# Patient Record
Sex: Male | Born: 1955
Health system: Southern US, Community
[De-identification: ages and names within clinical notes are randomized; demographics above are authoritative.]

## PROBLEM LIST (undated history)

## (undated) DIAGNOSIS — M5117 Intervertebral disc disorders with radiculopathy, lumbosacral region: Secondary | ICD-10-CM

## (undated) DIAGNOSIS — I480 Paroxysmal atrial fibrillation: Secondary | ICD-10-CM

## (undated) DIAGNOSIS — Z87891 Personal history of nicotine dependence: Secondary | ICD-10-CM

## (undated) DIAGNOSIS — I5030 Unspecified diastolic (congestive) heart failure: Secondary | ICD-10-CM

## (undated) DIAGNOSIS — C61 Malignant neoplasm of prostate: Secondary | ICD-10-CM

## (undated) DIAGNOSIS — M1710 Unilateral primary osteoarthritis, unspecified knee: Secondary | ICD-10-CM

## (undated) DIAGNOSIS — K635 Polyp of colon: Secondary | ICD-10-CM

## (undated) DIAGNOSIS — G894 Chronic pain syndrome: Secondary | ICD-10-CM

## (undated) DIAGNOSIS — K5790 Diverticulosis of intestine, part unspecified, without perforation or abscess without bleeding: Secondary | ICD-10-CM

## (undated) DIAGNOSIS — I471 Supraventricular tachycardia, unspecified: Secondary | ICD-10-CM

## (undated) DIAGNOSIS — I10 Essential (primary) hypertension: Secondary | ICD-10-CM

## (undated) DIAGNOSIS — Z79899 Other long term (current) drug therapy: Secondary | ICD-10-CM

## (undated) DIAGNOSIS — Z8719 Personal history of other diseases of the digestive system: Secondary | ICD-10-CM

## (undated) DIAGNOSIS — I77810 Thoracic aortic ectasia: Secondary | ICD-10-CM

## (undated) DIAGNOSIS — T466X5A Adverse effect of antihyperlipidemic and antiarteriosclerotic drugs, initial encounter: Secondary | ICD-10-CM

## (undated) DIAGNOSIS — M791 Myalgia, unspecified site: Secondary | ICD-10-CM

## (undated) DIAGNOSIS — Z7901 Long term (current) use of anticoagulants: Secondary | ICD-10-CM

## (undated) DIAGNOSIS — R296 Repeated falls: Secondary | ICD-10-CM

## (undated) DIAGNOSIS — I700 Atherosclerosis of aorta: Secondary | ICD-10-CM

## (undated) DIAGNOSIS — E785 Hyperlipidemia, unspecified: Secondary | ICD-10-CM

## (undated) DIAGNOSIS — K648 Other hemorrhoids: Secondary | ICD-10-CM

## (undated) DIAGNOSIS — Z7982 Long term (current) use of aspirin: Secondary | ICD-10-CM

## (undated) DIAGNOSIS — F1190 Opioid use, unspecified, uncomplicated: Secondary | ICD-10-CM

## (undated) DIAGNOSIS — E782 Mixed hyperlipidemia: Secondary | ICD-10-CM

## (undated) DIAGNOSIS — R911 Solitary pulmonary nodule: Secondary | ICD-10-CM

## (undated) DIAGNOSIS — F419 Anxiety disorder, unspecified: Secondary | ICD-10-CM

## (undated) DIAGNOSIS — M1990 Unspecified osteoarthritis, unspecified site: Secondary | ICD-10-CM

## (undated) DIAGNOSIS — D494 Neoplasm of unspecified behavior of bladder: Secondary | ICD-10-CM

## (undated) DIAGNOSIS — G479 Sleep disorder, unspecified: Secondary | ICD-10-CM

## (undated) DIAGNOSIS — M899 Disorder of bone, unspecified: Secondary | ICD-10-CM

## (undated) DIAGNOSIS — K8020 Calculus of gallbladder without cholecystitis without obstruction: Secondary | ICD-10-CM

## (undated) DIAGNOSIS — D126 Benign neoplasm of colon, unspecified: Secondary | ICD-10-CM

## (undated) DIAGNOSIS — I429 Cardiomyopathy, unspecified: Secondary | ICD-10-CM

## (undated) DIAGNOSIS — M179 Osteoarthritis of knee, unspecified: Secondary | ICD-10-CM

## (undated) DIAGNOSIS — K4090 Unilateral inguinal hernia, without obstruction or gangrene, not specified as recurrent: Secondary | ICD-10-CM

## (undated) DIAGNOSIS — M75102 Unspecified rotator cuff tear or rupture of left shoulder, not specified as traumatic: Secondary | ICD-10-CM

## (undated) DIAGNOSIS — N200 Calculus of kidney: Secondary | ICD-10-CM

## (undated) DIAGNOSIS — R600 Localized edema: Secondary | ICD-10-CM

## (undated) DIAGNOSIS — Z7902 Long term (current) use of antithrombotics/antiplatelets: Secondary | ICD-10-CM

## (undated) DIAGNOSIS — D510 Vitamin B12 deficiency anemia due to intrinsic factor deficiency: Secondary | ICD-10-CM

## (undated) DIAGNOSIS — Z87442 Personal history of urinary calculi: Secondary | ICD-10-CM

## (undated) HISTORY — DX: Malignant neoplasm of prostate: C61

## (undated) HISTORY — DX: Unilateral primary osteoarthritis, unspecified knee: M17.10

## (undated) HISTORY — PX: KNEE SURGERY: SHX244

## (undated) HISTORY — DX: Supraventricular tachycardia: I47.1

## (undated) HISTORY — DX: Osteoarthritis of knee, unspecified: M17.9

## (undated) HISTORY — DX: Vitamin B12 deficiency anemia due to intrinsic factor deficiency: D51.0

## (undated) HISTORY — DX: Intervertebral disc disorders with radiculopathy, lumbosacral region: M51.17

## (undated) HISTORY — PX: OTHER SURGICAL HISTORY: SHX169

## (undated) HISTORY — DX: Calculus of kidney: N20.0

## (undated) HISTORY — DX: Personal history of urinary calculi: Z87.442

## (undated) HISTORY — DX: Supraventricular tachycardia, unspecified: I47.10

---

## 2000-01-17 HISTORY — PX: GASTRIC BYPASS: SHX52

## 2004-09-16 ENCOUNTER — Ambulatory Visit: Payer: Self-pay | Admitting: Internal Medicine

## 2008-04-07 ENCOUNTER — Ambulatory Visit: Payer: Self-pay | Admitting: Gastroenterology

## 2009-04-17 ENCOUNTER — Ambulatory Visit: Payer: Self-pay | Admitting: Internal Medicine

## 2010-01-16 DIAGNOSIS — N200 Calculus of kidney: Secondary | ICD-10-CM

## 2010-01-16 DIAGNOSIS — Z87442 Personal history of urinary calculi: Secondary | ICD-10-CM

## 2010-01-16 HISTORY — DX: Calculus of kidney: N20.0

## 2010-01-16 HISTORY — DX: Personal history of urinary calculi: Z87.442

## 2010-12-20 ENCOUNTER — Ambulatory Visit: Payer: Self-pay | Admitting: Urology

## 2010-12-22 ENCOUNTER — Ambulatory Visit: Payer: Self-pay | Admitting: Urology

## 2013-12-26 ENCOUNTER — Encounter: Payer: Self-pay | Admitting: Cardiovascular Disease

## 2013-12-26 ENCOUNTER — Other Ambulatory Visit (INDEPENDENT_AMBULATORY_CARE_PROVIDER_SITE_OTHER): Payer: BC Managed Care – PPO

## 2013-12-26 ENCOUNTER — Ambulatory Visit (INDEPENDENT_AMBULATORY_CARE_PROVIDER_SITE_OTHER): Payer: BC Managed Care – PPO | Admitting: Cardiovascular Disease

## 2013-12-26 ENCOUNTER — Other Ambulatory Visit: Payer: Self-pay

## 2013-12-26 VITALS — BP 104/80 | HR 92 | Ht 72.0 in | Wt 320.0 lb

## 2013-12-26 DIAGNOSIS — I4891 Unspecified atrial fibrillation: Secondary | ICD-10-CM

## 2013-12-26 DIAGNOSIS — I10 Essential (primary) hypertension: Secondary | ICD-10-CM | POA: Insufficient documentation

## 2013-12-26 DIAGNOSIS — C61 Malignant neoplasm of prostate: Secondary | ICD-10-CM

## 2013-12-26 DIAGNOSIS — Z7189 Other specified counseling: Secondary | ICD-10-CM | POA: Insufficient documentation

## 2013-12-26 DIAGNOSIS — Z8546 Personal history of malignant neoplasm of prostate: Secondary | ICD-10-CM | POA: Insufficient documentation

## 2013-12-26 DIAGNOSIS — Z79899 Other long term (current) drug therapy: Secondary | ICD-10-CM | POA: Insufficient documentation

## 2013-12-26 DIAGNOSIS — K625 Hemorrhage of anus and rectum: Secondary | ICD-10-CM | POA: Insufficient documentation

## 2013-12-26 MED ORDER — FUROSEMIDE 20 MG PO TABS: 20.0000 mg | ORAL_TABLET | Freq: Every day | ORAL | Status: DC | PRN

## 2013-12-26 NOTE — Assessment & Plan Note (Signed)
Blood pressure initially low on blood pressure check by the nurse. Mildly higher by my check but still 460 systolic Recommended if he has lightheaded spells concerning for orthostasis, we would encourage hydration, cut back on some of his medications such as amlodipine or the ACE inhibitor

## 2013-12-26 NOTE — Assessment & Plan Note (Signed)
Heart rate relatively well controlled. He continues in atrial fibrillation today. Tolerating xarelto 20 mg daily. We had a long discussion concerning his anticoagulation, rate control, options for his atrial fibrillation. We have recommended that he proceed with his colonoscopy and restart xarelto once appropriate. After he has been on anticoagulation for 3-4 weeks, we would start antiarrhythmic in an effort to restore normal sinus rhythm. Echocardiogram has been ordered in preparation for this. If unsuccessful,  DCCV would be offered

## 2013-12-26 NOTE — Assessment & Plan Note (Signed)
coagulation options discussed with him today. Stopping and starting anticoagulation for colonoscopy also discussed with him.

## 2013-12-26 NOTE — Assessment & Plan Note (Signed)
Recent prostate cancer treatment, rectal bleeding. Possible radiation prostatitis. Colonoscopy pending

## 2013-12-26 NOTE — Progress Notes (Signed)
Patient ID: [name], male    DOB: 22-Mar-1955, 58 y.o.   MRN: [id number]  HPI Comments: [name] is a pleasant 58 year old gentleman with history of prostate cancer, treatment with radiation and seed implants, rectal bleeding, found to be recently in atrial fibrillation who is referred for evaluation of arrhythmia. Notes indicate a history of gastric bypass, pernicious anemia, SVT, hypertension, ostial arthritis of the knee, disc disease  On presentation today, he reports that he feels relatively well. He does report having symptoms of feeling swimmy headed starting perhaps 3 months ago. Also occasional nausea episodes. Uncertain if this is related to new onset atrial fibrillation. In general he is asymptomatic from atrial fibrillation, denies having shortness of breath, no leg edema. He was started on Xarelto 20 mg daily. Denies having any GI bleeding on anticoagulation. colonoscopy  to be performed in the very near future for rectal bleeding  He reports having significant symptoms of rectal bleeding in July 2015. Blood in his urine and stool. This is better now. He denies any recent stressors that could have caused the atrial fibrillation. No prior echocardiogram  In general he reports that his weight has been trending downward. Reports having some dizziness, possibly low blood pressure. He reports the cause of his rectal bleeding was possibly from radiation proctitis  EKG on today's visit shows atrial fibrillation with ventricular rate 90 bpm, no significant ST or T-wave changes Previous EKG reviewed with him from primary care dated 12/08/2013 showing atrial fibrillation with rate 100 bpm   No Known Allergies  Outpatient Encounter Prescriptions as of 12/26/2013: amLODipine (NORVASC) 10 MG tablet, Take 10 mg by mouth daily. aspirin 325 MG EC tablet, Take 325 mg by mouth daily. benazepril (LOTENSIN) 40 MG tablet, Take 40 mg by mouth daily. cyanocobalamin (,VITAMIN B-12,) 1000  MCG/ML injection, Inject 1,000 mcg into the skin every 30 (thirty) days.  cyanocobalamin 1000 MCG tablet, Take 100 mcg by mouth daily. furosemide (LASIX) 20 MG tablet, Take 1 tablet (20 mg total) by mouth daily as needed. metoprolol succinate (TOPROL-XL) 50 MG 24 hr tablet, Take 50 mg by mouth daily. Take with or immediately following a meal. rivaroxaban (XARELTO) 20 MG TABS tablet, Take 20 mg by mouth daily with supper. tamsulosin (FLOMAX) 0.4 MG CAPS capsule, Take 0.4 mg by mouth 2 (two) times daily.  traZODone (DESYREL) 50 MG tablet, Take 50 mg by mouth at bedtime. (DISCONTINUED) meloxicam (MOBIC) 15 MG tablet, Take 15 mg by mouth daily.    Past Medical History:   HTN (hypertension)                                           SVT (supraventricular tachycardia)                           Pernicious anemia                                            Intervertebral disc disorder with radiculopath*              DJD (degenerative joint disease) of knee                     Kidney stone  2012         New onset a-fib                                              Prostate cancer                                             Past Surgical History:   KNEE SURGERY                                                    Comment:knee trauma   GASTRIC BYPASS                                   2002        Social History  reports that he quit smoking about 34 years ago. His smoking use included Cigarettes. He has a 10 pack-year smoking history. He does not have any smokeless tobacco history on file. He reports that he drinks about 7.2 oz of alcohol per week. He reports that he does not use illicit drugs.  Family History family history includes Arrhythmia in his brother and sister; Heart disease in his father; Hypertension in his father.         Review of Systems  Constitutional: Negative.   HENT: Negative.   Eyes: Negative.   Respiratory: Negative.   Cardiovascular:  Negative.   Gastrointestinal: Negative.   Musculoskeletal: Negative.   Neurological: Positive for dizziness.  Hematological: Negative.   Psychiatric/Behavioral: Negative.   All other systems reviewed and are negative.   BP 104/80 mmHg  Pulse 92  Ht 6' (1.829 m)  Wt 320 lb (145.151 kg)  BMI 43.39 kg/m2  Physical Exam  Constitutional: He is oriented to person, place, and time. He appears well-developed and well-nourished.  HENT:  Head: Normocephalic.  Nose: Nose normal.  Mouth/Throat: Oropharynx is clear and moist.  Eyes: Conjunctivae are normal. Pupils are equal, round, and reactive to light.  Neck: Normal range of motion. Neck supple. No JVD present.  Cardiovascular: Normal rate, regular rhythm, S1 normal, S2 normal, normal heart sounds and intact distal pulses.  Exam reveals no gallop and no friction rub.   No murmur heard. Pulmonary/Chest: Effort normal and breath sounds normal. No respiratory distress. He has no wheezes. He has no rales. He exhibits no tenderness.  Abdominal: Soft. Bowel sounds are normal. He exhibits no distension. There is no tenderness.  Musculoskeletal: Normal range of motion. He exhibits no edema or tenderness.  Lymphadenopathy:    He has no cervical adenopathy.  Neurological: He is alert and oriented to person, place, and time. Coordination normal.  Skin: Skin is warm and dry. No rash noted. No erythema.  Psychiatric: He has a normal mood and affect. His behavior is normal. Judgment and thought content normal.      Assessment and Plan   Nursing note and vitals reviewed.

## 2013-12-26 NOTE — Assessment & Plan Note (Signed)
Unclear. His a hemorrhoid or other etiology. Acceptable risk for colonoscopy. We would stop the xarelto for 3 days prior to the procedure, restart one to 2 days after the procedure depending on if biopsies are taken

## 2013-12-26 NOTE — Patient Instructions (Signed)
You are doing well. No medication changes were made.  Please take lasix as needed   We will schedule an echocardiogram for atrial fibrillation  Please call us if you have new issues that need to be addressed before your next appt.  Your physician wants you to follow-up in:   7 weeks.

## 2014-01-13 ENCOUNTER — Ambulatory Visit: Payer: Self-pay | Admitting: Gastroenterology

## 2014-02-13 ENCOUNTER — Encounter: Payer: Self-pay | Admitting: Cardiovascular Disease

## 2014-02-13 ENCOUNTER — Encounter (INDEPENDENT_AMBULATORY_CARE_PROVIDER_SITE_OTHER): Payer: Self-pay

## 2014-02-13 ENCOUNTER — Ambulatory Visit (INDEPENDENT_AMBULATORY_CARE_PROVIDER_SITE_OTHER): Payer: BLUE CROSS/BLUE SHIELD | Admitting: Cardiovascular Disease

## 2014-02-13 ENCOUNTER — Ambulatory Visit: Payer: BC Managed Care – PPO | Admitting: Cardiovascular Disease

## 2014-02-13 VITALS — BP 110/80 | HR 99 | Ht 72.0 in | Wt 320.2 lb

## 2014-02-13 DIAGNOSIS — I4891 Unspecified atrial fibrillation: Secondary | ICD-10-CM

## 2014-02-13 DIAGNOSIS — Z7189 Other specified counseling: Secondary | ICD-10-CM

## 2014-02-13 DIAGNOSIS — K625 Hemorrhage of anus and rectum: Secondary | ICD-10-CM

## 2014-02-13 DIAGNOSIS — I10 Essential (primary) hypertension: Secondary | ICD-10-CM

## 2014-02-13 MED ORDER — AMIODARONE HCL 200 MG PO TABS: 200.0000 mg | ORAL_TABLET | Freq: Two times a day (BID) | ORAL | Status: DC

## 2014-02-13 NOTE — Assessment & Plan Note (Signed)
Blood pressure has been running low on his prostate medication. We have recommended he cut his amlodipine down to 5 mg daily

## 2014-02-13 NOTE — Assessment & Plan Note (Signed)
Tolerating Xarelto. No recurrent bleeding

## 2014-02-13 NOTE — Assessment & Plan Note (Signed)
We will start him on amiodarone 400 mg twice a day for 5 days down to 200 mg twice a day with EKG in 2 weeks time. If he has not converted to normal sinus rhythm, we would arrange a cardioversion. Recommended he stay on anticoagulation

## 2014-02-13 NOTE — Progress Notes (Signed)
Patient ID: [name], male    DOB: 25-Mar-1955, 59 y.o.   MRN: [id number]  HPI Comments: Mr. Rotondo is a pleasant 59 year old gentleman with history of prostate cancer, treatment with radiation and seed implants, rectal bleeding, who follows up for recent diagnosis of atrial fibrillation at the end of 2015 Notes indicate a history of gastric bypass, pernicious anemia, SVT, hypertension, ostial arthritis of the knee, disc disease  In follow-up today, he reports feeling well. Occasionally feels fluttering, palpitations. One episode of lightheadedness when he stood up quickly in the evening going to the bathroom. He fell down, hit his head. Reports taking Flomax twice a day. Blood pressure has been running low recently, around 297 systolic denies any lower extremity edema, no shortness of breath with exertion  Recent echocardiogram December 2015 showing mildly depressed ejection fraction 45%, moderately dilated left atrium  No GI bleeding on anticoagulation. Recent colonoscopy  EKG on today's visit shows atrial fibrillation with rate 99 bpm, no significant ST or T-wave changes  Other past medical history  He reports having significant symptoms of rectal bleeding in July 2015. Blood in his urine and stool. This is better now. He denies any recent stressors that could have caused the atrial fibrillation.  No Known Allergies  Outpatient Encounter Prescriptions as of 02/13/2014  Medication Sig  . amLODipine (NORVASC) 10 MG tablet Take 10 mg by mouth daily.  Marland Kitchen aspirin 325 MG EC tablet Take 325 mg by mouth daily.  . benazepril (LOTENSIN) 40 MG tablet Take 40 mg by mouth daily.  . cyanocobalamin (,VITAMIN B-12,) 1000 MCG/ML injection Inject 1,000 mcg into the skin every 30 (thirty) days.   . cyanocobalamin 1000 MCG tablet Take 100 mcg by mouth daily.  . furosemide (LASIX) 20 MG tablet Take 1 tablet (20 mg total) by mouth daily as needed.  . metoprolol succinate (TOPROL-XL) 50 MG 24 hr tablet  Take 50 mg by mouth daily. Take with or immediately following a meal.  . rivaroxaban (XARELTO) 20 MG TABS tablet Take 20 mg by mouth daily with supper.  . tamsulosin (FLOMAX) 0.4 MG CAPS capsule Take 0.4 mg by mouth 2 (two) times daily.   . traZODone (DESYREL) 50 MG tablet Take 50 mg by mouth at bedtime.  Marland Kitchen amiodarone (PACERONE) 200 MG tablet Take 1 tablet (200 mg total) by mouth 2 (two) times daily.    Past Medical History  Diagnosis Date  . HTN (hypertension)   . SVT (supraventricular tachycardia)   . Pernicious anemia   . Intervertebral disc disorder with radiculopathy of lumbosacral region   . DJD (degenerative joint disease) of knee   . Kidney stone 2012  . New onset a-fib   . Prostate cancer     Past Surgical History  Procedure Laterality Date  . Knee surgery      knee trauma  . Gastric bypass  2002    Social History  reports that he quit smoking about 35 years ago. His smoking use included Cigarettes. He has a 10 pack-year smoking history. He does not have any smokeless tobacco history on file. He reports that he drinks about 7.2 oz of alcohol per week. He reports that he does not use illicit drugs.  Family History family history includes Arrhythmia in his brother and sister; Heart disease in his father; Hypertension in his father.  Review of Systems  Constitutional: Negative.   Respiratory: Negative.   Cardiovascular: Positive for palpitations.  Gastrointestinal: Negative.   Musculoskeletal: Negative.  Neurological: Positive for dizziness.  Hematological: Negative.   Psychiatric/Behavioral: Negative.   All other systems reviewed and are negative.   BP 110/80 mmHg  Pulse 99  Ht 6' (1.829 m)  Wt 320 lb 4 oz (145.264 kg)  BMI 43.42 kg/m2  Physical Exam  Constitutional: He is oriented to person, place, and time. He appears well-developed and well-nourished.  HENT:  Head: Normocephalic.  Nose: Nose normal.  Mouth/Throat: Oropharynx is clear and moist.   Eyes: Conjunctivae are normal. Pupils are equal, round, and reactive to light.  Neck: Normal range of motion. Neck supple. No JVD present.  Cardiovascular: Normal rate, S1 normal, S2 normal, normal heart sounds and intact distal pulses.  An irregularly irregular rhythm present. Exam reveals no gallop and no friction rub.   No murmur heard. Pulmonary/Chest: Effort normal and breath sounds normal. No respiratory distress. He has no wheezes. He has no rales. He exhibits no tenderness.  Abdominal: Soft. Bowel sounds are normal. He exhibits no distension. There is no tenderness.  Musculoskeletal: Normal range of motion. He exhibits no edema or tenderness.  Lymphadenopathy:    He has no cervical adenopathy.  Neurological: He is alert and oriented to person, place, and time. Coordination normal.  Skin: Skin is warm and dry. No rash noted. No erythema.  Psychiatric: He has a normal mood and affect. His behavior is normal. Judgment and thought content normal.      Assessment and Plan   Nursing note and vitals reviewed.

## 2014-02-13 NOTE — Patient Instructions (Addendum)
You are doing well.  Cut the amlodipine in 1/2 daily, for low blood pressure  Please start amiodarone 400 mg twice a day After 5 days, Please decrease the dose down to 200 mg twice a day  Please come in for an ekg in 2 weeks  Please call us if you have new issues that need to be addressed before your next appt.  Your physician wants you to follow-up in: 5 to 6 weeks

## 2014-02-13 NOTE — Assessment & Plan Note (Signed)
No recurrence of his rectal bleeding on anticoagulation

## 2014-02-15 ENCOUNTER — Other Ambulatory Visit: Payer: Self-pay | Admitting: Cardiovascular Disease

## 2014-02-27 ENCOUNTER — Ambulatory Visit (INDEPENDENT_AMBULATORY_CARE_PROVIDER_SITE_OTHER): Payer: BLUE CROSS/BLUE SHIELD

## 2014-02-27 VITALS — BP 114/76 | HR 83

## 2014-02-27 DIAGNOSIS — I4891 Unspecified atrial fibrillation: Secondary | ICD-10-CM

## 2014-02-27 NOTE — Progress Notes (Signed)
1.) Reason for visit: EKG  2.) Name of MD requesting visit: Dr. Rockey Situ  3.) H&P:  Pt advised at ov 02/13/14 to come in for EKG.  Per Dr. Rockey Situ, if pt still in afib, we will set up an cardioversion.   4.) ROS related to problem: Pt reports that he has been somewhat SOB, BP is still running a bit low for him.  Reports lightheadedness on standing, though does report a decrease in these episodes.  5.) Assessment and plan per MD: Pt sched for DCCV 03/13/14 @ 7:30.  Reviewed instructions w/ pt and he verbalizes understanding.

## 2014-02-27 NOTE — Patient Instructions (Signed)
Your physician has recommended that you have a Cardioversion (DCCV). Electrical Cardioversion uses a jolt of electricity to your heart either through paddles or wired patches attached to your chest. This is a controlled, usually prescheduled, procedure. Defibrillation is done under light anesthesia in the hospital, and you usually go home the day of the procedure. This is done to get your heart back into a normal rhythm. You are not awake for the procedure. Please see the instruction sheet given to you today.  Please arrive at the [place] entrance of [facility] at 6:30 am on Friday, February 26  -Do not eat or drink after midnight the night before your procedure -Take all or your medications that morning with a sip of water -You will need someone to drive you home after your procedure

## 2014-02-28 LAB — CBC WITH DIFFERENTIAL/PLATELET
BASOS ABS: 0 10*3/uL (ref 0.0–0.2)
BASOS: 0 %
EOS ABS: 0.1 10*3/uL (ref 0.0–0.4)
Eos: 1 %
HCT: 40.9 % (ref 37.5–51.0)
HEMOGLOBIN: 13 g/dL (ref 12.6–17.7)
Immature Grans (Abs): 0 10*3/uL (ref 0.0–0.1)
Immature Granulocytes: 0 %
Lymphocytes Absolute: 2 10*3/uL (ref 0.7–3.1)
Lymphs: 24 %
MCH: 24.3 pg — ABNORMAL LOW (ref 26.6–33.0)
MCHC: 31.8 g/dL (ref 31.5–35.7)
MCV: 76 fL — ABNORMAL LOW (ref 79–97)
Monocytes Absolute: 0.9 10*3/uL (ref 0.1–0.9)
Monocytes: 11 %
NEUTROS PCT: 64 %
Neutrophils Absolute: 5.1 10*3/uL (ref 1.4–7.0)
Platelets: 216 10*3/uL (ref 150–379)
RBC: 5.36 x10E6/uL (ref 4.14–5.80)
RDW: 17.2 % — ABNORMAL HIGH (ref 12.3–15.4)
WBC: 8.1 10*3/uL (ref 3.4–10.8)

## 2014-02-28 LAB — BASIC METABOLIC PANEL
BUN/Creatinine Ratio: 14 (ref 10–22)
BUN: 16 mg/dL (ref 8–27)
CALCIUM: 9.2 mg/dL (ref 8.6–10.2)
CHLORIDE: 109 mmol/L — AB (ref 97–108)
CO2: 21 mmol/L (ref 18–29)
Creatinine, Ser: 1.14 mg/dL (ref 0.76–1.27)
GFR calc non Af Amer: 70 mL/min/{1.73_m2} (ref >59)
GFR, EST AFRICAN AMERICAN: 80 mL/min/{1.73_m2} (ref >59)
GLUCOSE: 90 mg/dL (ref 65–99)
Potassium: 5.6 mmol/L — ABNORMAL HIGH (ref 3.5–5.2)
Sodium: 147 mmol/L — ABNORMAL HIGH (ref 134–144)

## 2014-02-28 LAB — PROTIME-INR
INR: 1.2 (ref 0.8–1.2)
Prothrombin Time: 12.7 s — ABNORMAL HIGH (ref 9.1–12.0)

## 2014-03-08 ENCOUNTER — Other Ambulatory Visit: Payer: Self-pay | Admitting: Cardiovascular Disease

## 2014-03-13 ENCOUNTER — Ambulatory Visit: Payer: Self-pay | Admitting: Cardiovascular Disease

## 2014-03-13 DIAGNOSIS — I4891 Unspecified atrial fibrillation: Secondary | ICD-10-CM

## 2014-03-13 HISTORY — PX: CARDIOVERSION: SHX1299

## 2014-03-20 ENCOUNTER — Encounter: Payer: Self-pay | Admitting: Cardiovascular Disease

## 2014-03-20 ENCOUNTER — Ambulatory Visit (INDEPENDENT_AMBULATORY_CARE_PROVIDER_SITE_OTHER): Payer: BLUE CROSS/BLUE SHIELD | Admitting: Cardiovascular Disease

## 2014-03-20 VITALS — BP 142/82 | HR 59 | Ht 72.0 in | Wt 320.0 lb

## 2014-03-20 DIAGNOSIS — E669 Obesity, unspecified: Secondary | ICD-10-CM

## 2014-03-20 DIAGNOSIS — E875 Hyperkalemia: Secondary | ICD-10-CM

## 2014-03-20 DIAGNOSIS — K625 Hemorrhage of anus and rectum: Secondary | ICD-10-CM

## 2014-03-20 DIAGNOSIS — I10 Essential (primary) hypertension: Secondary | ICD-10-CM

## 2014-03-20 DIAGNOSIS — Z7189 Other specified counseling: Secondary | ICD-10-CM

## 2014-03-20 DIAGNOSIS — Z9884 Bariatric surgery status: Secondary | ICD-10-CM | POA: Insufficient documentation

## 2014-03-20 DIAGNOSIS — I4891 Unspecified atrial fibrillation: Secondary | ICD-10-CM

## 2014-03-20 MED ORDER — METOPROLOL SUCCINATE ER 25 MG PO TB24: 25.0000 mg | ORAL_TABLET | Freq: Every day | ORAL | Status: DC

## 2014-03-20 NOTE — Assessment & Plan Note (Signed)
We will recheck a potassium today, previously was 5.6

## 2014-03-20 NOTE — Assessment & Plan Note (Signed)
No symptoms of bleeding. We'll continue anticoagulation

## 2014-03-20 NOTE — Assessment & Plan Note (Signed)
He denies any further rectal bleeding on anticoagulation

## 2014-03-20 NOTE — Assessment & Plan Note (Signed)
We have encouraged continued exercise, careful diet management in an effort to lose weight. 

## 2014-03-20 NOTE — Patient Instructions (Signed)
You are doing well.  We will recheck BMP today  Please decrease the amiodarone down to one a day Please add a metoprolol 25 mg at night, continue 50 mg in the AM  Please call us if you have new issues that need to be addressed before your next appt.  Your physician wants you to follow-up in: 6 months.  You will receive a reminder letter in the mail two months in advance. If you don't receive a letter, please call our office to schedule the follow-up appointment.

## 2014-03-20 NOTE — Assessment & Plan Note (Signed)
He does report occasional loose bowel movements

## 2014-03-20 NOTE — Assessment & Plan Note (Signed)
Recent cardioversion last week February 2016. This was successful, and in follow-up today he is maintaining normal sinus rhythm. We have recommended he stay on anticoagulation, he decrease the amiodarone down to 200 mg daily, add additional metoprolol succinate 25 mg at nighttime (he reports heart rate is mildly elevated in the early morning). Suggested he monitor his blood pressure and heart rate

## 2014-03-20 NOTE — Progress Notes (Signed)
Patient ID: RUTILIO YELLOWHAIR, male    DOB: 10/30/1955, 59 y.o.   MRN: [id number]  HPI Comments: Mr. Burrows is a pleasant 59 year old gentleman with history of prostate cancer, treatment with radiation and seed implants, rectal bleeding, who follows up for atrial fibrillation , status post cardioversion last week February 2016 Atrial fibrillation started at the end of 2015 Notes indicate a history of gastric bypass, pernicious anemia, SVT, hypertension, ostial arthritis of the knee, disc disease  In follow-up today, he reports that he feels well. He can notice a difference in normal sinus rhythm. Better energy, sleeping better, less shortness of breath. Overall feels great. He has been tracking his heart rate and reports heart rate typically 60 up to 70 bpm. Prior to cardioversion, heart rate was never in this range. That is how he knows he has been maintaining normal rhythm. Reports blood pressure has been well-controlled. Amlodipine decreased down to 5 mg previously No GI bleeding on anticoagulation. He denies any significant lower extremity swelling. He is not taking Lasix very much  EKG on today's visit shows normal sinus rhythm with rate 59 bpm, no significant ST or T-wave changes  Other past medical history Previous episode of lightheadedness while he was in atrial fibrillation when he stood up quickly in the evening going to the bathroom. He fell down, hit his head. Reports taking Flomax twice a day. Blood pressure has been running low recently, around 008 systolic denies any lower extremity edema, no shortness of breath with exertion   echocardiogram December 2015 showing mildly depressed ejection fraction 45%, moderately dilated left atrium  He reports having significant symptoms of rectal bleeding in July 2015. Blood in his urine and stool. This is better now.  No Known Allergies  Outpatient Encounter Prescriptions as of 03/20/2014  Medication Sig  . amiodarone (PACERONE) 200 MG tablet  Take 1 tablet (200 mg total) by mouth 2 (two) times daily.  Marland Kitchen amLODipine (NORVASC) 5 MG tablet TAKE 1 TABLET BY MOUTH EVERY DAY  . aspirin 325 MG EC tablet Take 325 mg by mouth daily.  . benazepril (LOTENSIN) 40 MG tablet Take 40 mg by mouth daily.  . cyanocobalamin (,VITAMIN B-12,) 1000 MCG/ML injection Inject 1,000 mcg into the skin every 30 (thirty) days.   . furosemide (LASIX) 20 MG tablet Take 1 tablet (20 mg total) by mouth daily as needed.  . metoprolol succinate (TOPROL-XL) 50 MG 24 hr tablet Take 50 mg by mouth daily. Take with or immediately following a meal.  . tamsulosin (FLOMAX) 0.4 MG CAPS capsule Take 0.4 mg by mouth 2 (two) times daily.   . traZODone (DESYREL) 50 MG tablet Take 50 mg by mouth at bedtime.  Alveda Reasons 20 MG TABS tablet TAKE 1 TABLET BY MOUTH EVERY DAY  . metoprolol succinate (TOPROL XL) 25 MG 24 hr tablet Take 1 tablet (25 mg total) by mouth daily.  . [DISCONTINUED] amLODipine (NORVASC) 10 MG tablet Take 10 mg by mouth daily.  . [DISCONTINUED] cyanocobalamin 1000 MCG tablet Take 100 mcg by mouth daily.    Past Medical History  Diagnosis Date  . HTN (hypertension)   . SVT (supraventricular tachycardia)   . Pernicious anemia   . Intervertebral disc disorder with radiculopathy of lumbosacral region   . DJD (degenerative joint disease) of knee   . Kidney stone 2012  . New onset a-fib   . Prostate cancer     Past Surgical History  Procedure Laterality Date  . Knee surgery  knee trauma  . Gastric bypass  2002    Social History  reports that he quit smoking about 35 years ago. His smoking use included Cigarettes. He has a 10 pack-year smoking history. He does not have any smokeless tobacco history on file. He reports that he drinks about 7.2 oz of alcohol per week. He reports that he does not use illicit drugs.  Family History family history includes Arrhythmia in his brother and sister; Heart disease in his father; Hypertension in his  father.  Review of Systems  Constitutional: Negative.   Respiratory: Negative.   Cardiovascular: Negative.   Gastrointestinal: Negative.   Musculoskeletal: Negative.   Neurological: Negative.   Hematological: Negative.   Psychiatric/Behavioral: Negative.   All other systems reviewed and are negative.   BP 142/82 mmHg  Pulse 59  Ht 6' (1.829 m)  Wt 320 lb (145.151 kg)  BMI 43.39 kg/m2  Physical Exam  Constitutional: He is oriented to person, place, and time. He appears well-developed and well-nourished.  HENT:  Head: Normocephalic.  Nose: Nose normal.  Mouth/Throat: Oropharynx is clear and moist.  Eyes: Conjunctivae are normal. Pupils are equal, round, and reactive to light.  Neck: Normal range of motion. Neck supple. No JVD present.  Cardiovascular: Normal rate, S1 normal, S2 normal, normal heart sounds and intact distal pulses.  An irregularly irregular rhythm present. Exam reveals no gallop and no friction rub.   No murmur heard. Pulmonary/Chest: Effort normal and breath sounds normal. No respiratory distress. He has no wheezes. He has no rales. He exhibits no tenderness.  Abdominal: Soft. Bowel sounds are normal. He exhibits no distension. There is no tenderness.  Musculoskeletal: Normal range of motion. He exhibits no edema or tenderness.  Lymphadenopathy:    He has no cervical adenopathy.  Neurological: He is alert and oriented to person, place, and time. Coordination normal.  Skin: Skin is warm and dry. No rash noted. No erythema.  Psychiatric: He has a normal mood and affect. His behavior is normal. Judgment and thought content normal.      Assessment and Plan   Nursing note and vitals reviewed.

## 2014-03-20 NOTE — Assessment & Plan Note (Signed)
Blood pressure is well controlled on today's visit. No changes made to the medications. 

## 2014-03-21 LAB — BASIC METABOLIC PANEL
BUN/Creatinine Ratio: 15 (ref 10–22)
BUN: 15 mg/dL (ref 8–27)
CO2: 22 mmol/L (ref 18–29)
CREATININE: 0.97 mg/dL (ref 0.76–1.27)
Calcium: 9.2 mg/dL (ref 8.6–10.2)
Chloride: 104 mmol/L (ref 97–108)
GFR calc Af Amer: 98 mL/min/{1.73_m2} (ref >59)
GFR calc non Af Amer: 84 mL/min/{1.73_m2} (ref >59)
Glucose: 92 mg/dL (ref 65–99)
Potassium: 5.3 mmol/L — ABNORMAL HIGH (ref 3.5–5.2)
Sodium: 142 mmol/L (ref 134–144)

## 2014-04-10 ENCOUNTER — Other Ambulatory Visit: Payer: Self-pay | Admitting: Cardiovascular Disease

## 2014-05-11 LAB — SURGICAL PATHOLOGY

## 2014-06-28 ENCOUNTER — Other Ambulatory Visit: Payer: Self-pay | Admitting: Family Medicine

## 2014-06-30 ENCOUNTER — Other Ambulatory Visit: Payer: Self-pay | Admitting: Cardiovascular Disease

## 2014-07-01 ENCOUNTER — Telehealth: Payer: Self-pay

## 2014-07-01 NOTE — Telephone Encounter (Signed)
Rx for Tramadol phoned in

## 2014-07-02 ENCOUNTER — Telehealth: Payer: Self-pay | Admitting: Family Medicine

## 2014-07-02 NOTE — Telephone Encounter (Signed)
Pt requesting new script for Tamadol.  He will run out tomorrow.   [facility] [place], [place]

## 2014-08-03 ENCOUNTER — Telehealth: Payer: Self-pay | Admitting: Family Medicine

## 2014-08-03 MED ORDER — TRAMADOL HCL 50 MG PO TABS: 50.0000 mg | ORAL_TABLET | Freq: Two times a day (BID) | ORAL | Status: DC | PRN

## 2014-08-03 NOTE — Telephone Encounter (Signed)
Pt called stated pharmacy has faxed [place] several times for a refill on Tramadol. Pt wants to be able to pick this up today. Pharm is [facility] in [place]. Thanks.

## 2014-08-03 NOTE — Telephone Encounter (Signed)
E-fax came through for refill on: Rx: traMADol (ULTRAM) 50 MG tablet Copy in basket

## 2014-08-04 NOTE — Telephone Encounter (Signed)
Rx printed to wrong printer yesterday, called in today [facility]

## 2014-08-05 ENCOUNTER — Telehealth: Payer: Self-pay

## 2014-08-05 MED ORDER — TRAMADOL HCL 50 MG PO TABS: 50.0000 mg | ORAL_TABLET | Freq: Four times a day (QID) | ORAL | Status: DC | PRN

## 2014-08-05 NOTE — Telephone Encounter (Signed)
Patient states his Tramadol has historically been 1-2tab q6h prn, his new rx is for 1tab q12h He cannot take anything else for arthritis pain because of blood thinner. He would like you to write the Rx again - [place]

## 2014-08-06 NOTE — Telephone Encounter (Signed)
Called and left on prescription voicemail.

## 2014-08-15 ENCOUNTER — Other Ambulatory Visit: Payer: Self-pay | Admitting: Family Medicine

## 2014-08-17 ENCOUNTER — Other Ambulatory Visit: Payer: Self-pay | Admitting: Family Medicine

## 2014-08-24 ENCOUNTER — Other Ambulatory Visit: Payer: Self-pay | Admitting: Unknown Physician Specialty

## 2014-08-24 NOTE — Telephone Encounter (Signed)
Your patient.  Thanks 

## 2014-10-11 ENCOUNTER — Other Ambulatory Visit: Payer: Self-pay | Admitting: Family Medicine

## 2014-10-27 ENCOUNTER — Encounter: Payer: Self-pay | Admitting: Family Medicine

## 2014-10-27 ENCOUNTER — Ambulatory Visit (INDEPENDENT_AMBULATORY_CARE_PROVIDER_SITE_OTHER): Payer: BLUE CROSS/BLUE SHIELD | Admitting: Family Medicine

## 2014-10-27 ENCOUNTER — Inpatient Hospital Stay: Admission: RE | Admit: 2014-10-27 | Payer: BLUE CROSS/BLUE SHIELD | Source: Ambulatory Visit

## 2014-10-27 ENCOUNTER — Encounter: Payer: Self-pay | Admitting: Cardiovascular Disease

## 2014-10-27 ENCOUNTER — Ambulatory Visit (INDEPENDENT_AMBULATORY_CARE_PROVIDER_SITE_OTHER): Payer: BLUE CROSS/BLUE SHIELD | Admitting: Cardiovascular Disease

## 2014-10-27 VITALS — BP 112/72 | HR 63 | Resp 16 | Ht 72.0 in | Wt 316.2 lb

## 2014-10-27 VITALS — BP 136/87 | HR 67 | Temp 98.6°F | Ht 70.7 in | Wt 314.0 lb

## 2014-10-27 DIAGNOSIS — K625 Hemorrhage of anus and rectum: Secondary | ICD-10-CM

## 2014-10-27 DIAGNOSIS — G5603 Carpal tunnel syndrome, bilateral upper limbs: Secondary | ICD-10-CM | POA: Diagnosis not present

## 2014-10-27 DIAGNOSIS — Z8249 Family history of ischemic heart disease and other diseases of the circulatory system: Secondary | ICD-10-CM

## 2014-10-27 DIAGNOSIS — I10 Essential (primary) hypertension: Secondary | ICD-10-CM | POA: Diagnosis not present

## 2014-10-27 DIAGNOSIS — M1731 Unilateral post-traumatic osteoarthritis, right knee: Secondary | ICD-10-CM | POA: Diagnosis not present

## 2014-10-27 DIAGNOSIS — I4891 Unspecified atrial fibrillation: Secondary | ICD-10-CM | POA: Diagnosis not present

## 2014-10-27 DIAGNOSIS — M179 Osteoarthritis of knee, unspecified: Secondary | ICD-10-CM | POA: Insufficient documentation

## 2014-10-27 DIAGNOSIS — E785 Hyperlipidemia, unspecified: Secondary | ICD-10-CM

## 2014-10-27 DIAGNOSIS — E782 Mixed hyperlipidemia: Secondary | ICD-10-CM | POA: Insufficient documentation

## 2014-10-27 DIAGNOSIS — Z6841 Body Mass Index (BMI) 40.0 and over, adult: Secondary | ICD-10-CM | POA: Insufficient documentation

## 2014-10-27 DIAGNOSIS — M1710 Unilateral primary osteoarthritis, unspecified knee: Secondary | ICD-10-CM | POA: Insufficient documentation

## 2014-10-27 LAB — LP+ALT+AST PICCOLO, WAIVED
ALT (SGPT) PICCOLO, WAIVED: 12 U/L (ref 10–47)
AST (SGOT) Piccolo, Waived: 26 U/L (ref 11–38)
Chol/HDL Ratio Piccolo,Waive: 2.8 mg/dL
Cholesterol Piccolo, Waived: 221 mg/dL — ABNORMAL HIGH (ref <200)
HDL CHOL PICCOLO, WAIVED: 78 mg/dL (ref >59)
LDL Chol Calc Piccolo Waived: 124 mg/dL — ABNORMAL HIGH (ref <100)
Triglycerides Piccolo,Waived: 96 mg/dL (ref <150)
VLDL Chol Calc Piccolo,Waive: 19 mg/dL (ref <30)

## 2014-10-27 MED ORDER — TRAZODONE HCL 50 MG PO TABS: 50.0000 mg | ORAL_TABLET | Freq: Every day | ORAL | Status: DC

## 2014-10-27 MED ORDER — AMLODIPINE BESYLATE 5 MG PO TABS: 5.0000 mg | ORAL_TABLET | Freq: Every day | ORAL | Status: DC

## 2014-10-27 MED ORDER — TRAMADOL HCL 50 MG PO TABS: 50.0000 mg | ORAL_TABLET | Freq: Four times a day (QID) | ORAL | Status: DC | PRN

## 2014-10-27 MED ORDER — CYANOCOBALAMIN 1000 MCG/ML IJ SOLN: 100.0000 ug | INTRAMUSCULAR | Status: DC

## 2014-10-27 MED ORDER — BENAZEPRIL HCL 40 MG PO TABS: 40.0000 mg | ORAL_TABLET | Freq: Every day | ORAL | Status: DC

## 2014-10-27 MED ORDER — RIVAROXABAN 20 MG PO TABS: 20.0000 mg | ORAL_TABLET | Freq: Every day | ORAL | Status: DC

## 2014-10-27 NOTE — Assessment & Plan Note (Signed)
High cholesterol discussed with him. Strong family history of coronary artery disease including brother and father Long discussion concerning various treatment options. We will order a CT coronary calcium score for risk stratification Would likely benefit from a statin. We will hold off pending the study result

## 2014-10-27 NOTE — Progress Notes (Signed)
BP 136/87 mmHg  Pulse 67  Temp(Src) 98.6 F (37 C)  Ht 5' 10.7" (1.796 m)  Wt 314 lb (142.429 kg)  BMI 44.16 kg/m2  SpO2 98%   Subjective:    Patient ID: [name], male    DOB: Feb 22, 1955, 59 y.o.   MRN: [id number]  HPI: [name] is a 59 y.o. male  Chief Complaint  Patient presents with  . Hyperlipidemia  . Hypertension   Patient follow-up hypertension doing well taking medications without problems. Taking medications faithfully. For BPH has been doing well Dr. Eliberto Ivory stopped Flomax today Taking Xarelto without problems no bleeding or bruising. Sleeping okay with trazodone Patient was taking arthritis medicine for pain in his knees and hips were if had joint replacement. Arthritis medicine stopped for Xarelto now taking tramadol 3-4 a day.   Relevant past medical, surgical, family and social history reviewed and updated as indicated. Interim medical history since our last visit reviewed. Allergies and medications reviewed and updated.  Review of Systems  Constitutional: Negative.   Respiratory: Negative.   Cardiovascular: Negative.     Per HPI unless specifically indicated above     Objective:    BP 136/87 mmHg  Pulse 67  Temp(Src) 98.6 F (37 C)  Ht 5' 10.7" (1.796 m)  Wt 314 lb (142.429 kg)  BMI 44.16 kg/m2  SpO2 98%  Wt Readings from Last 3 Encounters:  10/27/14 314 lb (142.429 kg)  04/21/14 314 lb (142.429 kg)  03/20/14 320 lb (145.151 kg)    Physical Exam  Constitutional: He is oriented to person, place, and time. He appears well-developed and well-nourished. No distress.  HENT:  Head: Normocephalic and atraumatic.  Right Ear: Hearing normal.  Left Ear: Hearing normal.  Nose: Nose normal.  Eyes: Conjunctivae and lids are normal. Right eye exhibits no discharge. Left eye exhibits no discharge. No scleral icterus.  Cardiovascular: Normal rate, regular rhythm and normal heart sounds.   Pulmonary/Chest: Effort normal and breath sounds normal.  No respiratory distress.  Musculoskeletal: Normal range of motion.  Neurological: He is alert and oriented to person, place, and time.  Skin: Skin is intact. No rash noted.  Psychiatric: He has a normal mood and affect. His speech is normal and behavior is normal. Judgment and thought content normal. Cognition and memory are normal.    Results for orders placed or performed in visit on 20/25/42  Basic Metabolic Panel (BMET)  Result Value Ref Range   Glucose 92 65 - 99 mg/dL   BUN 15 8 - 27 mg/dL   Creatinine, Ser 0.97 0.76 - 1.27 mg/dL   GFR calc non Af Amer 84 >59 mL/min/1.73   GFR calc Af Amer 98 >59 mL/min/1.73   BUN/Creatinine Ratio 15 10 - 22   Sodium 142 134 - 144 mmol/L   Potassium 5.3 (H) 3.5 - 5.2 mmol/L   Chloride 104 97 - 108 mmol/L   CO2 22 18 - 29 mmol/L   Calcium 9.2 8.6 - 10.2 mg/dL      Assessment & Plan:   Problem List Items Addressed This Visit      Cardiovascular and Mediastinum   New onset a-fib ([place])    The current medical regimen is effective;  continue present plan and medications.       Relevant Medications   benazepril (LOTENSIN) 40 MG tablet   amLODipine (NORVASC) 5 MG tablet   rivaroxaban (XARELTO) 20 MG TABS tablet   Essential hypertension    The current  medical regimen is effective;  continue present plan and medications.       Relevant Medications   benazepril (LOTENSIN) 40 MG tablet   amLODipine (NORVASC) 5 MG tablet   rivaroxaban (XARELTO) 20 MG TABS tablet     Musculoskeletal and Integument   DJD (degenerative joint disease) of knee    Pain controlled with tramadol 3-4 a day patient's been using 100 a month      Relevant Medications   traMADol (ULTRAM) 50 MG tablet   traZODone (DESYREL) 50 MG tablet    Other Visit Diagnoses    Essential hypertension, benign    -  Primary    Relevant Medications    benazepril (LOTENSIN) 40 MG tablet    amLODipine (NORVASC) 5 MG tablet    rivaroxaban (XARELTO) 20 MG TABS tablet    Other  Relevant Orders    LP+ALT+AST [place], Waived    Basic metabolic panel        Follow up plan: Return in about 6 months (around 04/27/2015) for Physical Exam.

## 2014-10-27 NOTE — Assessment & Plan Note (Signed)
Discussed with patient carpal tunnel getting worse keeps him from sleeping at night has been using splints long-term. He is ready to consider surgery will make referral to orthopedics.
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
## 2014-10-27 NOTE — Assessment & Plan Note (Signed)
Recommended he hold his aspirin, stay on xarelto  No further bleeding

## 2014-10-27 NOTE — Assessment & Plan Note (Signed)
Blood pressure is well controlled on today's visit. No changes made to the medications. 

## 2014-10-27 NOTE — Assessment & Plan Note (Signed)
The current medical regimen is effective;  continue present plan and medications.  

## 2014-10-27 NOTE — Addendum Note (Signed)
Addended byGolden Pop on: 10/27/2014 12:00 PM   Modules accepted: Orders

## 2014-10-27 NOTE — Patient Instructions (Addendum)
You are doing well. No medication changes were made.  You are scheduled for a CT coronary calcium score for family hx of CAD Today at 4:30, please arrive @ 4:15 There is a one-time fee of $150.00 due at the time of your procedure  Please call us if you have new issues that need to be addressed before your next appt.  Your physician wants you to follow-up in: 12 months.  You will receive a reminder letter in the mail two months in advance. If you don't receive a letter, please call our office to schedule the follow-up appointment.  Coronary Calcium Scan A coronary calcium scan is an imaging test used to look for deposits of calcium and other fatty materials (plaques) in the inner lining of the blood vessels of your heart (coronary arteries). These deposits of calcium and plaques can partly clog and narrow the coronary arteries without producing any symptoms or warning signs. This puts you at risk for a heart attack. This test can detect these deposits before symptoms develop.  LET [facility] CARE PROVIDER KNOW ABOUT:  Any allergies you have.  All medicines you are taking, including vitamins, herbs, eye drops, creams, and over-the-counter medicines.  Previous problems you or members of your family have had with the use of anesthetics.  Any blood disorders you have.  Previous surgeries you have had.  Medical conditions you have.  Possibility of pregnancy, if this applies. RISKS AND COMPLICATIONS Generally, this is a safe procedure. However, as with any procedure, complications can occur. This test involves the use of radiation. Radiation exposure can be dangerous to a pregnant woman and her unborn baby. If you are pregnant, you should not have this procedure done.  BEFORE THE PROCEDURE There is no special preparation for the procedure. PROCEDURE  You will need to undress and put on a hospital gown. You will need to remove any jewelry around your neck or chest.  Sticky electrodes  are placed on your chest and are connected to an electrocardiogram (EKG or electrocardiography) machine to recorda tracing of the electrical activity of your heart.  A CT scanner will take pictures of your heart. During this time, you will be asked to lie still and hold your breath for 2-3 seconds while a picture is being taken of your heart. AFTER THE PROCEDURE   You will be allowed to get dressed.  You can return to your normal activities after the scan is done.   This information is not intended to replace advice given to you by your health care provider. Make sure you discuss any questions you have with your health care provider.   Document Released: 07/01/2007 Document Revised: 01/07/2013 Document Reviewed: 09/09/2012 Elsevier Interactive Patient Education [employer].
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
## 2014-10-27 NOTE — Assessment & Plan Note (Signed)
His weight is likely contributing to atrial fibrillation episodes. Recommended weight loss, exercise

## 2014-10-27 NOTE — Assessment & Plan Note (Signed)
Pain controlled with tramadol 3-4 a day patient's been using 100 a month

## 2014-10-28 LAB — BASIC METABOLIC PANEL
BUN / CREAT RATIO: 15 (ref 10–22)
BUN: 16 mg/dL (ref 8–27)
CALCIUM: 9.4 mg/dL (ref 8.6–10.2)
CHLORIDE: 104 mmol/L (ref 97–108)
CO2: 21 mmol/L (ref 18–29)
Creatinine, Ser: 1.07 mg/dL (ref 0.76–1.27)
GFR, EST AFRICAN AMERICAN: 86 mL/min/{1.73_m2} (ref >59)
GFR, EST NON AFRICAN AMERICAN: 75 mL/min/{1.73_m2} (ref >59)
Glucose: 88 mg/dL (ref 65–99)
POTASSIUM: 5.6 mmol/L — AB (ref 3.5–5.2)
Sodium: 142 mmol/L (ref 134–144)

## 2014-10-29 ENCOUNTER — Telehealth: Payer: Self-pay | Admitting: Family Medicine

## 2014-10-29 DIAGNOSIS — G5603 Carpal tunnel syndrome, bilateral upper limbs: Secondary | ICD-10-CM

## 2014-10-29 NOTE — Telephone Encounter (Signed)
Phone call Discussed with patient elevated potassium which patient is having the past discussed avoiding potassium rich foods which patient's been eating bananas We'll stop that recheck BMP 1 month

## 2014-10-29 NOTE — Telephone Encounter (Signed)
-----   Message from Wynn Maudlin, [place] sent at 10/29/2014  5:08 PM EDT ----- Labs, line 4230

## 2014-11-06 ENCOUNTER — Ambulatory Visit (INDEPENDENT_AMBULATORY_CARE_PROVIDER_SITE_OTHER)
Admission: RE | Admit: 2014-11-06 | Discharge: 2014-11-06 | Disposition: A | Discharge status: Home / Self Care | Payer: BLUE CROSS/BLUE SHIELD | Source: Ambulatory Visit | Attending: Cardiovascular Disease | Admitting: Cardiovascular Disease

## 2014-11-06 DIAGNOSIS — Z8249 Family history of ischemic heart disease and other diseases of the circulatory system: Secondary | ICD-10-CM

## 2014-11-06 DIAGNOSIS — I4891 Unspecified atrial fibrillation: Secondary | ICD-10-CM

## 2014-11-06 DIAGNOSIS — I10 Essential (primary) hypertension: Secondary | ICD-10-CM

## 2014-11-06 DIAGNOSIS — I2510 Atherosclerotic heart disease of native coronary artery without angina pectoris: Secondary | ICD-10-CM

## 2014-11-06 HISTORY — DX: Atherosclerotic heart disease of native coronary artery without angina pectoris: I25.10

## 2014-11-10 ENCOUNTER — Encounter: Payer: Self-pay | Admitting: Family Medicine

## 2014-11-10 ENCOUNTER — Telehealth: Payer: Self-pay | Admitting: Family Medicine

## 2014-11-10 ENCOUNTER — Other Ambulatory Visit: Payer: Self-pay

## 2014-11-10 ENCOUNTER — Other Ambulatory Visit: Payer: Self-pay | Admitting: Family Medicine

## 2014-11-10 DIAGNOSIS — R911 Solitary pulmonary nodule: Secondary | ICD-10-CM

## 2014-11-10 MED ORDER — ROSUVASTATIN CALCIUM 10 MG PO TABS: 10.0000 mg | ORAL_TABLET | Freq: Every day | ORAL | Status: DC

## 2014-11-10 NOTE — Progress Notes (Signed)
Phone call Discussed with patient chest CT for cardiac calcium scoring showed 7 mm nodule with patient being low risk has been off cigarettes for over 20 years Will repeat chest CT in 6 months

## 2014-11-10 NOTE — Telephone Encounter (Signed)
Pt called wanting to know if Dr Jeananne Rama had received information about the spot on his lung found during his cardiac CT?  Would like for Dr Jeananne Rama to call him to discuss. It is in his chart, it was done at a [facility].

## 2014-11-16 DIAGNOSIS — G5600 Carpal tunnel syndrome, unspecified upper limb: Secondary | ICD-10-CM | POA: Insufficient documentation

## 2014-12-18 ENCOUNTER — Telehealth: Payer: Self-pay | Admitting: Cardiovascular Disease

## 2014-12-18 NOTE — Telephone Encounter (Signed)
Paperwork is on Dr. Donivan Scull desk for review.

## 2014-12-18 NOTE — Telephone Encounter (Signed)
Received cardiac clearance request for pt to proceed w/ right carpel tunnel surgery on 12/23/14 w/ Dr. Sabra Heck. Per Dr. Rockey Situ, pt is cleared to proceed and may hold Xarelto x 2 days prior to procedure.  Spoke w/ pt.  Advised him of Dr. Donivan Scull recommendation. He is appreciative of the call. Faxed clearance to [phone].

## 2014-12-18 NOTE — Telephone Encounter (Signed)
Needs clearance form signed and sent to Dr. Sabra Heck asap.  Surgery on Wednesday.  Please call when sent.    Patient says form should be on gollan's desk.  If he has to reschedule it will be next year and the deductible will increase.

## 2014-12-21 ENCOUNTER — Encounter
Admission: RE | Admit: 2014-12-21 | Discharge: 2014-12-21 | Disposition: A | Discharge status: Home / Self Care | Payer: BLUE CROSS/BLUE SHIELD | Source: Ambulatory Visit | Attending: Specialist | Admitting: Specialist

## 2014-12-21 ENCOUNTER — Ambulatory Visit: Payer: BLUE CROSS/BLUE SHIELD | Admitting: Unknown Physician Specialty

## 2014-12-21 DIAGNOSIS — Z9884 Bariatric surgery status: Secondary | ICD-10-CM | POA: Diagnosis not present

## 2014-12-21 DIAGNOSIS — G5601 Carpal tunnel syndrome, right upper limb: Secondary | ICD-10-CM | POA: Diagnosis present

## 2014-12-21 DIAGNOSIS — Z79899 Other long term (current) drug therapy: Secondary | ICD-10-CM | POA: Diagnosis not present

## 2014-12-21 DIAGNOSIS — Z87891 Personal history of nicotine dependence: Secondary | ICD-10-CM | POA: Diagnosis not present

## 2014-12-21 DIAGNOSIS — M1990 Unspecified osteoarthritis, unspecified site: Secondary | ICD-10-CM | POA: Diagnosis not present

## 2014-12-21 DIAGNOSIS — I4891 Unspecified atrial fibrillation: Secondary | ICD-10-CM | POA: Diagnosis not present

## 2014-12-21 HISTORY — DX: Localized edema: R60.0

## 2014-12-21 HISTORY — DX: Essential (primary) hypertension: I10

## 2014-12-21 LAB — CBC
HCT: 39.2 % — ABNORMAL LOW (ref 40.0–52.0)
HEMOGLOBIN: 12.4 g/dL — AB (ref 13.0–18.0)
MCH: 23.8 pg — AB (ref 26.0–34.0)
MCHC: 31.8 g/dL — AB (ref 32.0–36.0)
MCV: 75.1 fL — ABNORMAL LOW (ref 80.0–100.0)
PLATELETS: 206 10*3/uL (ref 150–440)
RBC: 5.22 MIL/uL (ref 4.40–5.90)
RDW: 16.2 % — ABNORMAL HIGH (ref 11.5–14.5)
WBC: 6.9 10*3/uL (ref 3.8–10.6)

## 2014-12-21 LAB — BASIC METABOLIC PANEL
Anion gap: 5 (ref 5–15)
BUN: 18 mg/dL (ref 6–20)
CALCIUM: 8.9 mg/dL (ref 8.9–10.3)
CO2: 24 mmol/L (ref 22–32)
Chloride: 111 mmol/L (ref 101–111)
Creatinine, Ser: 0.91 mg/dL (ref 0.61–1.24)
Glucose, Bld: 93 mg/dL (ref 65–99)
Potassium: 4.5 mmol/L (ref 3.5–5.1)
SODIUM: 140 mmol/L (ref 135–145)

## 2014-12-21 NOTE — Patient Instructions (Signed)
  Your procedure is scheduled on: 12/23/14 Wed  Report to Day Surgery. To find out your arrival time please call [phone] between 1PM - 3PM on 12/22/14 Tues.  Remember: Instructions that are not followed completely may result in serious medical risk, up to and including death, or upon the discretion of your surgeon and anesthesiologist your surgery may need to be rescheduled.    __x__ 1. Do not eat food or drink liquids after midnight. No gum chewing or hard candies.     __x__ 2. No Alcohol for 24 hours before or after surgery.   ____ 3. Bring all medications with you on the day of surgery if instructed.    __x__ 4. Notify your doctor if there is any change in your medical condition     (cold, fever, infections).     Do not wear jewelry, make-up, hairpins, clips or nail polish.  Do not wear lotions, powders, or perfumes. You may wear deodorant.  Do not shave 48 hours prior to surgery. Men may shave face and neck.  Do not bring valuables to the hospital.    [facility] is not responsible for any belongings or valuables.               Contacts, dentures or bridgework may not be worn into surgery.  Leave your suitcase in the car. After surgery it may be brought to your room.  For patients admitted to the hospital, discharge time is determined by your                treatment team.   Patients discharged the day of surgery will not be allowed to drive home.   Please read over the following fact sheets that you were given:      _x__ Take these medicines the morning of surgery with A SIP OF WATER:    1. amiodarone (PACERONE) 200 MG tablet  2. benazepril (LOTENSIN) 40 MG tablet  3. amLODipine (NORVASC) 5 MG tablet  4.  5.  6.  ____ Fleet Enema (as directed)   _x___ Use CHG Soap as directed  ____ Use inhalers on the day of surgery  ____ Stop metformin 2 days prior to surgery    ____ Take 1/2 of usual insulin dose the night before surgery and none on the morning of surgery.    __x__ Stop Coumadin/Plavix/aspirin on Stop xarelto 2 days before surgery  _x___ Stop Anti-inflammatories on today may take Tylenol as needed   ____ Stop supplements until after surgery.    ____ Bring C-Pap to the hospital.

## 2014-12-23 ENCOUNTER — Ambulatory Visit
Admission: RE | Admit: 2014-12-23 | Discharge: 2014-12-23 | Disposition: A | Discharge status: Home / Self Care | Payer: BLUE CROSS/BLUE SHIELD | Source: Ambulatory Visit | Attending: Specialist | Admitting: Specialist

## 2014-12-23 ENCOUNTER — Encounter
Admission: RE | Disposition: A | Discharge status: Home / Self Care | Payer: Self-pay | Source: Ambulatory Visit | Attending: Specialist

## 2014-12-23 ENCOUNTER — Ambulatory Visit: Payer: BLUE CROSS/BLUE SHIELD | Admitting: Anesthesiology

## 2014-12-23 ENCOUNTER — Encounter: Payer: Self-pay | Admitting: *Deleted

## 2014-12-23 DIAGNOSIS — G5601 Carpal tunnel syndrome, right upper limb: Secondary | ICD-10-CM | POA: Insufficient documentation

## 2014-12-23 DIAGNOSIS — M1990 Unspecified osteoarthritis, unspecified site: Secondary | ICD-10-CM | POA: Insufficient documentation

## 2014-12-23 DIAGNOSIS — Z79899 Other long term (current) drug therapy: Secondary | ICD-10-CM | POA: Insufficient documentation

## 2014-12-23 DIAGNOSIS — Z87891 Personal history of nicotine dependence: Secondary | ICD-10-CM | POA: Insufficient documentation

## 2014-12-23 DIAGNOSIS — I4891 Unspecified atrial fibrillation: Secondary | ICD-10-CM | POA: Insufficient documentation

## 2014-12-23 DIAGNOSIS — Z9884 Bariatric surgery status: Secondary | ICD-10-CM | POA: Insufficient documentation

## 2014-12-23 HISTORY — PX: CARPAL TUNNEL RELEASE: SHX101

## 2014-12-23 SURGERY — CARPAL TUNNEL RELEASE
Anesthesia: General | Site: Hand | Laterality: Right | Wound class: Clean

## 2014-12-23 MED ORDER — FAMOTIDINE 20 MG PO TABS
ORAL_TABLET | ORAL | Status: AC
  Filled 2014-12-23: qty 1

## 2014-12-23 MED ORDER — CEFAZOLIN SODIUM-DEXTROSE 2-3 GM-% IV SOLR
INTRAVENOUS | Status: AC
  Filled 2014-12-23: qty 50

## 2014-12-23 MED ORDER — LACTATED RINGERS IV SOLN
INTRAVENOUS | Status: DC
  Administered 2014-12-23 (×2): via INTRAVENOUS

## 2014-12-23 MED ORDER — BUPIVACAINE HCL (PF) 0.5 % IJ SOLN
INTRAMUSCULAR | Status: AC
  Filled 2014-12-23: qty 30

## 2014-12-23 MED ORDER — FAMOTIDINE 20 MG PO TABS
20.0000 mg | ORAL_TABLET | Freq: Once | ORAL | Status: AC
  Administered 2014-12-23: 20 mg via ORAL

## 2014-12-23 MED ORDER — ONDANSETRON HCL 4 MG/2ML IJ SOLN
INTRAMUSCULAR | Status: DC | PRN
  Administered 2014-12-23: 4 mg via INTRAVENOUS

## 2014-12-23 MED ORDER — GABAPENTIN 400 MG PO CAPS
ORAL_CAPSULE | ORAL | Status: AC
  Filled 2014-12-23: qty 1

## 2014-12-23 MED ORDER — ACETAMINOPHEN 10 MG/ML IV SOLN
INTRAVENOUS | Status: DC | PRN
  Administered 2014-12-23: 1000 mg via INTRAVENOUS

## 2014-12-23 MED ORDER — GABAPENTIN 400 MG PO CAPS
400.0000 mg | ORAL_CAPSULE | Freq: Every day | ORAL | Status: DC
  Administered 2014-12-23: 400 mg via ORAL

## 2014-12-23 MED ORDER — ACETAMINOPHEN 10 MG/ML IV SOLN
INTRAVENOUS | Status: AC
  Filled 2014-12-23: qty 100

## 2014-12-23 MED ORDER — FENTANYL CITRATE (PF) 100 MCG/2ML IJ SOLN
INTRAMUSCULAR | Status: DC | PRN
  Administered 2014-12-23: 50 ug via INTRAVENOUS
  Administered 2014-12-23: 25 ug via INTRAVENOUS

## 2014-12-23 MED ORDER — CEFAZOLIN SODIUM-DEXTROSE 2-3 GM-% IV SOLR
2.0000 g | Freq: Once | INTRAVENOUS | Status: AC
  Administered 2014-12-23: 2 g via INTRAVENOUS

## 2014-12-23 MED ORDER — HYDROCODONE-ACETAMINOPHEN 5-325 MG PO TABS: 1.0000 | ORAL_TABLET | Freq: Four times a day (QID) | ORAL | Status: DC | PRN

## 2014-12-23 MED ORDER — MELOXICAM 7.5 MG PO TABS
ORAL_TABLET | ORAL | Status: AC
  Filled 2014-12-23: qty 2

## 2014-12-23 MED ORDER — GLYCOPYRROLATE 0.2 MG/ML IJ SOLN
INTRAMUSCULAR | Status: DC | PRN
  Administered 2014-12-23: 0.2 mg via INTRAVENOUS

## 2014-12-23 MED ORDER — BUPIVACAINE HCL 0.5 % IJ SOLN
INTRAMUSCULAR | Status: DC | PRN
  Administered 2014-12-23: 17 mL

## 2014-12-23 MED ORDER — ONDANSETRON HCL 4 MG/2ML IJ SOLN: 4.0000 mg | Freq: Once | INTRAMUSCULAR | Status: DC | PRN

## 2014-12-23 MED ORDER — FENTANYL CITRATE (PF) 100 MCG/2ML IJ SOLN: 25.0000 ug | INTRAMUSCULAR | Status: DC | PRN

## 2014-12-23 MED ORDER — GABAPENTIN 400 MG PO CAPS: 400.0000 mg | ORAL_CAPSULE | Freq: Three times a day (TID) | ORAL | Status: DC

## 2014-12-23 MED ORDER — MELOXICAM 7.5 MG PO TABS
15.0000 mg | ORAL_TABLET | Freq: Every day | ORAL | Status: DC
  Administered 2014-12-23: 15 mg via ORAL

## 2014-12-23 SURGICAL SUPPLY — 21 items
BLADE SURG MINI STRL (BLADE) ×2 IMPLANT
BNDG ESMARK 4X12 TAN STRL LF (GAUZE/BANDAGES/DRESSINGS) ×2 IMPLANT
CANISTER SUCT 1200ML W/VALVE (MISCELLANEOUS) ×2 IMPLANT
CHLORAPREP W/TINT 26ML (MISCELLANEOUS) ×2 IMPLANT
GAUZE FLUFF 18X24 1PLY STRL (GAUZE/BANDAGES/DRESSINGS) ×2 IMPLANT
GAUZE PETRO XEROFOAM 1X8 (MISCELLANEOUS) ×2 IMPLANT
GLOVE BIO SURGEON STRL SZ8 (GLOVE) ×2 IMPLANT
GOWN STRL REUS W/ TWL LRG LVL3 (GOWN DISPOSABLE) ×2 IMPLANT
GOWN STRL REUS W/TWL LRG LVL3 (GOWN DISPOSABLE) ×2
KIT RM TURNOVER STRD PROC AR (KITS) ×2 IMPLANT
NS IRRIG 500ML POUR BTL (IV SOLUTION) ×2 IMPLANT
PACK EXTREMITY ARMC (MISCELLANEOUS) ×2 IMPLANT
PAD GROUND ADULT SPLIT (MISCELLANEOUS) ×2 IMPLANT
PAD PREP 24X41 OB/GYN DISP (PERSONAL CARE ITEMS) ×2 IMPLANT
SPLINT CAST 1 STEP 3X12 (MISCELLANEOUS) ×2 IMPLANT
STOCKINETTE BIAS CUT 4 980044 (GAUZE/BANDAGES/DRESSINGS) ×2 IMPLANT
STOCKINETTE STRL 4IN 9604848 (GAUZE/BANDAGES/DRESSINGS) ×2 IMPLANT
SUT ETHILON 4-0 (SUTURE) ×1
SUT ETHILON 4-0 FS2 18XMFL BLK (SUTURE) ×1
SUT ETHILON 5-0 FS-2 18 BLK (SUTURE) IMPLANT
SUTURE ETHLN 4-0 FS2 18XMF BLK (SUTURE) ×1 IMPLANT

## 2014-12-23 NOTE — Anesthesia Procedure Notes (Signed)
Procedure Name: LMA Insertion Date/Time: 12/23/2014 7:43 AM Performed by: Jonna Clark Pre-anesthesia Checklist: Patient identified, Patient being monitored, Timeout performed, Emergency Drugs available and Suction available Patient Re-evaluated:Patient Re-evaluated prior to inductionOxygen Delivery Method: Circle system utilized Preoxygenation: Pre-oxygenation with 100% oxygen Intubation Type: IV induction Ventilation: Mask ventilation without difficulty LMA: LMA inserted LMA Size: 4.5 Tube type: Oral Number of attempts: 1 Placement Confirmation: positive ETCO2 and breath sounds checked- equal and bilateral Tube secured with: Tape Dental Injury: Teeth and Oropharynx as per pre-operative assessment

## 2014-12-23 NOTE — Anesthesia Preprocedure Evaluation (Signed)
Anesthesia Evaluation  Patient identified by MRN, date of birth, ID band Patient awake    Reviewed: Allergy & Precautions, NPO status , Patient's Chart, lab work & pertinent test results, reviewed documented beta blocker date and time   History of Anesthesia Complications Negative for: history of anesthetic complications  Airway Mallampati: II       Dental  (+) Teeth Intact   Pulmonary neg pulmonary ROS, former smoker,           Cardiovascular hypertension, Pt. on medications and Pt. on home beta blockers + dysrhythmias (hx of afib, s/p cardioversion) Atrial Fibrillation      Neuro/Psych negative neurological ROS  negative psych ROS   GI/Hepatic negative GI ROS, Neg liver ROS,   Endo/Other  negative endocrine ROS  Renal/GU Renal disease (stones)     Musculoskeletal  (+) Arthritis , Osteoarthritis,    Abdominal   Peds  Hematology  (+) anemia ,   Anesthesia Other Findings   Reproductive/Obstetrics                             Anesthesia Physical Anesthesia Plan  ASA: II  Anesthesia Plan: General   Post-op Pain Management:    Induction: Intravenous  Airway Management Planned: LMA  Additional Equipment:   Intra-op Plan:   Post-operative Plan:   Informed Consent: I have reviewed the patients History and Physical, chart, labs and discussed the procedure including the risks, benefits and alternatives for the proposed anesthesia with the patient or authorized representative who has indicated his/her understanding and acceptance.     Plan Discussed with:   Anesthesia Plan Comments:         Anesthesia Quick Evaluation

## 2014-12-23 NOTE — Anesthesia Postprocedure Evaluation (Signed)
Anesthesia Post Note  Patient: [name]  Procedure(s) Performed: Procedure(s) (LRB): CARPAL TUNNEL RELEASE (Right)  Patient location during evaluation: PACU Anesthesia Type: General Level of consciousness: awake and alert Pain management: pain level controlled Vital Signs Assessment: post-procedure vital signs reviewed and stable Respiratory status: spontaneous breathing and respiratory function stable Cardiovascular status: blood pressure returned to baseline and stable Anesthetic complications: no    Last Vitals:  Filed Vitals:   12/23/14 0637 12/23/14 0835  BP: 131/84 124/85  Pulse: 84 63  Temp: 36.1 C 36.2 C  Resp: 14 16    Last Pain:  Filed Vitals:   12/23/14 0839  PainSc: 0-No pain                 KEPHART,WILLIAM K

## 2014-12-23 NOTE — Op Note (Signed)
12/23/2014  8:27 AM  PATIENT:  [name]    PRE-OPERATIVE DIAGNOSIS: RIGHT CARPAL TUNNEL SYNDROME POST-OPERATIVE DIAGNOSIS: RIGHT CARPAL TUNNEL SYNDROME  PROCEDURE:  RIGHT CARPAL TUNNEL RELEASE  SURGEON: Park Breed, MD    ANESTHESIA:   General  TOURNIQUET TIME:  PREOPERATIVE INDICATIONS:  [name] is a  59 y.o. male with a diagnosis of right carpal tunnel syndrome who failed conservative measures and elected for surgical management.    The risks benefits and alternatives were discussed with the patient preoperatively including but not limited to the risks of infection, bleeding, nerve injury, incomplete relief of symptoms, pillar pain, cardiopulmonary complications, the need for revision surgery, among others, and the patient was willing to proceed.  OPERATIVE FINDINGS: Thickened volar ligament and nerve compression.  OPERATIVE PROCEDURE: The patient is brought to the operating room placed in the supine position. General anesthesia was administered. The right upper extremity was prepped and draped in usual sterile fashion. Time out was performed. The arm was elevated and exsanguinated and the tourniquet was inflated. Incision was made in line with the radial border of the ring finger. The carpal tunnel transverse fascia was identified, cleaned, and incised sharply. The common sensory branches were visualized along with the superficial palmar arch and protected.  The median nerve was protected below. Deep retractors were placed underneath the transverse carpal ligament, protecting the nerve. I released the ligament completely, and then released the proximal distal volar forearm fascia. The nerve was identified, and visualized, and protected throughout the case. No masses or abnormalities were identified in ulnar bursa.  The wounds were irrigated copiously, and the wounds injected, and the skin closed with nylon followed by a volar splint and sterile gauze. Sponge and needle counts  were correct.  The patient tolerated this well, with no complications. Awakened and taken to recovery in good condition.

## 2014-12-23 NOTE — Transfer of Care (Signed)
Immediate Anesthesia Transfer of Care Note  Patient: [name]  Procedure(s) Performed: Procedure(s): CARPAL TUNNEL RELEASE (Right)  Patient Location: PACU  Anesthesia Type:General  Level of Consciousness: awake, alert  and oriented  Airway & Oxygen Therapy: Patient Spontanous Breathing and Patient connected to face mask oxygen  Post-op Assessment: Report given to RN and Post -op Vital signs reviewed and stable  Post vital signs: Reviewed and stable  Last Vitals:  Filed Vitals:   12/23/14 0637 12/23/14 0835  BP: 131/84 124/85  Pulse: 84 63  Temp: 36.1 C 36.2 C  Resp: 14 16    Complications: No apparent anesthesia complications

## 2014-12-23 NOTE — H&P (Signed)
THE PATIENT WAS SEEN IN THE HOLDING AREA.  HISTORY, ALLERGIES, HOME MEDICATIONS AND OPERATIVE PROCEDURE WERE REVIEWED. RISKS AND BENEFITS OF SURGERY DISCUSSED WITH PATIENT AGAIN.  NO CHANGES FROM INITIAL HISTORY AND PHYSICAL NOTED.    

## 2014-12-30 ENCOUNTER — Encounter: Payer: Self-pay | Admitting: *Deleted

## 2014-12-30 NOTE — Pre-Procedure Instructions (Signed)
Cleared by Dr Rockey Situ 12/18/14

## 2014-12-30 NOTE — Patient Instructions (Signed)
  Your procedure is scheduled on: 01/06/15 Report to Day Surgery. [facility]OND [facility]LO[facility] To find out your arrival time please call [phone] between 1PM - 3PM on 01/05/15 Remember: Instructions that are not followed completely may result in serious medical risk, up to and including death, or upon the discretion of your surgeon and anesthesiologist your surgery may need to be rescheduled.    _X___ 1. Do not eat food or drink liquids after midnight. No gum chewing or hard candies.     ____ 2. No Alcohol for 24 hours before or after surgery.   __X__ 3. Bring all medications with you on the day of surgery if instructed.    _X___ 4. Notify your doctor if there is any change in your medical condition     (cold, fever, infections).     Do not wear jewelry, make-up, hairpins, clips or nail polish.  Do not wear lotions, powders, or perfumes. You may wear deodorant.  Do not shave 48 hours prior to surgery. Men may shave face and neck.  Do not bring valuables to the hospital.    [facility] is not responsible for any belongings or valuables.               Contacts, dentures or bridgework may not be worn into surgery.  Leave your suitcase in the car. After surgery it may be brought to your room.  For patients admitted to the hospital, discharge time is determined by your                treatment team.   Patients discharged the day of surgery will not be allowed to drive home.   Please read over the following fact sheets that you were given:   Surgical Site Infection Prevention   ____ Take these medicines the morning of surgery with A SIP OF WATER:    1. PACERONE  2. AMLODIPINE  3. BENAZEPRIL   4.  5.  6.  ____ Fleet Enema (as directed)   ___X_ Use CHG Soap as directed  ____ Use inhalers on the day of surgery  ____ Stop metformin 2 days prior to surgery    ____ Take 1/2 of usual insulin dose the night before surgery and none on the morning of surgery.   __X__ Stop  Coumadin/Plavix/aspirin on   STOP PRADAXA 2 DAYS BEFORE SURGERY ____ Stop Anti-inflammatories on  ____ Stop supplements until after surgery.    ____ Bring C-Pap to the hospital.

## 2015-01-06 ENCOUNTER — Encounter: Payer: Self-pay | Admitting: *Deleted

## 2015-01-06 ENCOUNTER — Ambulatory Visit: Payer: BLUE CROSS/BLUE SHIELD | Admitting: Certified Registered Nurse Anesthetist

## 2015-01-06 ENCOUNTER — Encounter
Admission: RE | Disposition: A | Discharge status: Home / Self Care | Payer: Self-pay | Source: Ambulatory Visit | Attending: Specialist

## 2015-01-06 ENCOUNTER — Ambulatory Visit
Admission: RE | Admit: 2015-01-06 | Discharge: 2015-01-06 | Disposition: A | Discharge status: Home / Self Care | Payer: BLUE CROSS/BLUE SHIELD | Source: Ambulatory Visit | Attending: Specialist | Admitting: Specialist

## 2015-01-06 DIAGNOSIS — Z9884 Bariatric surgery status: Secondary | ICD-10-CM | POA: Insufficient documentation

## 2015-01-06 DIAGNOSIS — G5602 Carpal tunnel syndrome, left upper limb: Secondary | ICD-10-CM | POA: Insufficient documentation

## 2015-01-06 HISTORY — PX: CARPAL TUNNEL RELEASE: SHX101

## 2015-01-06 SURGERY — CARPAL TUNNEL RELEASE
Anesthesia: General | Site: Wrist | Laterality: Left | Wound class: Clean

## 2015-01-06 MED ORDER — ACETAMINOPHEN 10 MG/ML IV SOLN
INTRAVENOUS | Status: AC
  Filled 2015-01-06: qty 100

## 2015-01-06 MED ORDER — CHLORHEXIDINE GLUCONATE 4 % EX LIQD: 60.0000 mL | Freq: Once | CUTANEOUS | Status: DC

## 2015-01-06 MED ORDER — HYDROCODONE-ACETAMINOPHEN 5-325 MG PO TABS: 1.0000 | ORAL_TABLET | Freq: Four times a day (QID) | ORAL | Status: DC | PRN

## 2015-01-06 MED ORDER — GABAPENTIN 400 MG PO CAPS: 400.0000 mg | ORAL_CAPSULE | Freq: Three times a day (TID) | ORAL | Status: DC

## 2015-01-06 MED ORDER — MELOXICAM 7.5 MG PO TABS
15.0000 mg | ORAL_TABLET | Freq: Once | ORAL | Status: AC
  Administered 2015-01-06: 15 mg via ORAL

## 2015-01-06 MED ORDER — GABAPENTIN 400 MG PO CAPS
ORAL_CAPSULE | ORAL | Status: AC
  Filled 2015-01-06: qty 1

## 2015-01-06 MED ORDER — DEXAMETHASONE SODIUM PHOSPHATE 10 MG/ML IJ SOLN
INTRAMUSCULAR | Status: DC | PRN
  Administered 2015-01-06: 10 mg via INTRAVENOUS

## 2015-01-06 MED ORDER — FENTANYL CITRATE (PF) 100 MCG/2ML IJ SOLN: 25.0000 ug | INTRAMUSCULAR | Status: DC | PRN

## 2015-01-06 MED ORDER — EPHEDRINE SULFATE 50 MG/ML IJ SOLN
INTRAMUSCULAR | Status: DC | PRN
  Administered 2015-01-06: 5 mg via INTRAVENOUS

## 2015-01-06 MED ORDER — ONDANSETRON HCL 4 MG/2ML IJ SOLN
INTRAMUSCULAR | Status: DC | PRN
  Administered 2015-01-06: 4 mg via INTRAVENOUS

## 2015-01-06 MED ORDER — LACTATED RINGERS IV SOLN
INTRAVENOUS | Status: DC
  Administered 2015-01-06: 11:00:00 via INTRAVENOUS

## 2015-01-06 MED ORDER — CEFAZOLIN SODIUM-DEXTROSE 2-3 GM-% IV SOLR
2.0000 g | INTRAVENOUS | Status: AC
  Administered 2015-01-06: 2 g via INTRAVENOUS

## 2015-01-06 MED ORDER — ONDANSETRON HCL 4 MG/2ML IJ SOLN: 4.0000 mg | Freq: Once | INTRAMUSCULAR | Status: DC | PRN

## 2015-01-06 MED ORDER — PROPOFOL 10 MG/ML IV BOLUS
INTRAVENOUS | Status: DC | PRN
  Administered 2015-01-06: 200 mg via INTRAVENOUS

## 2015-01-06 MED ORDER — GLYCOPYRROLATE 0.2 MG/ML IJ SOLN
INTRAMUSCULAR | Status: DC | PRN
  Administered 2015-01-06: 0.2 mg via INTRAVENOUS

## 2015-01-06 MED ORDER — MELOXICAM 7.5 MG PO TABS
ORAL_TABLET | ORAL | Status: AC
  Administered 2015-01-06: 15 mg via ORAL
  Filled 2015-01-06: qty 2

## 2015-01-06 MED ORDER — GABAPENTIN 400 MG PO CAPS: 400.0000 mg | ORAL_CAPSULE | Freq: Once | ORAL | Status: DC

## 2015-01-06 MED ORDER — FAMOTIDINE 20 MG PO TABS
ORAL_TABLET | ORAL | Status: AC
  Administered 2015-01-06: 20 mg via ORAL
  Filled 2015-01-06: qty 1

## 2015-01-06 MED ORDER — ACETAMINOPHEN 10 MG/ML IV SOLN
INTRAVENOUS | Status: DC | PRN
  Administered 2015-01-06: 1000 mg via INTRAVENOUS

## 2015-01-06 MED ORDER — LIDOCAINE HCL (CARDIAC) 20 MG/ML IV SOLN
INTRAVENOUS | Status: DC | PRN
  Administered 2015-01-06: 100 mg via INTRAVENOUS

## 2015-01-06 MED ORDER — BUPIVACAINE HCL (PF) 0.5 % IJ SOLN
INTRAMUSCULAR | Status: AC
  Filled 2015-01-06: qty 30

## 2015-01-06 MED ORDER — FAMOTIDINE 20 MG PO TABS
20.0000 mg | ORAL_TABLET | Freq: Once | ORAL | Status: AC
Start: 2015-01-06 — End: 2015-01-06
  Administered 2015-01-06: 20 mg via ORAL

## 2015-01-06 MED ORDER — CEFAZOLIN SODIUM-DEXTROSE 2-3 GM-% IV SOLR
INTRAVENOUS | Status: AC
  Filled 2015-01-06: qty 50

## 2015-01-06 MED ORDER — MIDAZOLAM HCL 2 MG/2ML IJ SOLN
INTRAMUSCULAR | Status: DC | PRN
  Administered 2015-01-06: 2 mg via INTRAVENOUS

## 2015-01-06 MED ORDER — BUPIVACAINE HCL 0.5 % IJ SOLN
INTRAMUSCULAR | Status: DC | PRN
  Administered 2015-01-06: 17 mL

## 2015-01-06 MED ORDER — FENTANYL CITRATE (PF) 100 MCG/2ML IJ SOLN
INTRAMUSCULAR | Status: DC | PRN
  Administered 2015-01-06 (×2): 50 ug via INTRAVENOUS

## 2015-01-06 SURGICAL SUPPLY — 21 items
BLADE SURG MINI STRL (BLADE) ×2 IMPLANT
BNDG ESMARK 4X12 TAN STRL LF (GAUZE/BANDAGES/DRESSINGS) ×2 IMPLANT
CANISTER SUCT 1200ML W/VALVE (MISCELLANEOUS) ×2 IMPLANT
CHLORAPREP W/TINT 26ML (MISCELLANEOUS) ×2 IMPLANT
GAUZE FLUFF 18X24 1PLY STRL (GAUZE/BANDAGES/DRESSINGS) ×2 IMPLANT
GAUZE PETRO XEROFOAM 1X8 (MISCELLANEOUS) ×2 IMPLANT
GLOVE BIO SURGEON STRL SZ8 (GLOVE) ×2 IMPLANT
GOWN STRL REUS W/ TWL LRG LVL3 (GOWN DISPOSABLE) ×2 IMPLANT
GOWN STRL REUS W/TWL LRG LVL3 (GOWN DISPOSABLE) ×2
KIT RM TURNOVER STRD PROC AR (KITS) ×2 IMPLANT
NS IRRIG 500ML POUR BTL (IV SOLUTION) ×2 IMPLANT
PACK EXTREMITY ARMC (MISCELLANEOUS) ×2 IMPLANT
PAD GROUND ADULT SPLIT (MISCELLANEOUS) ×2 IMPLANT
PAD PREP 24X41 OB/GYN DISP (PERSONAL CARE ITEMS) ×2 IMPLANT
SPLINT CAST 1 STEP 3X12 (MISCELLANEOUS) ×2 IMPLANT
STOCKINETTE BIAS CUT 4 980044 (GAUZE/BANDAGES/DRESSINGS) ×2 IMPLANT
STOCKINETTE STRL 4IN 9604848 (GAUZE/BANDAGES/DRESSINGS) ×2 IMPLANT
SUT ETHILON 4-0 (SUTURE) ×1
SUT ETHILON 4-0 FS2 18XMFL BLK (SUTURE) ×1
SUT ETHILON 5-0 FS-2 18 BLK (SUTURE) ×2 IMPLANT
SUTURE ETHLN 4-0 FS2 18XMF BLK (SUTURE) ×1 IMPLANT

## 2015-01-06 NOTE — Anesthesia Procedure Notes (Signed)
Procedure Name: LMA Insertion Date/Time: 01/06/2015 11:02 AM Performed by: Johnna Acosta Pre-anesthesia Checklist: Patient identified, Emergency Drugs available, Suction available and Patient being monitored Patient Re-evaluated:Patient Re-evaluated prior to inductionOxygen Delivery Method: Circle system utilized Preoxygenation: Pre-oxygenation with 100% oxygen Intubation Type: IV induction LMA: LMA inserted LMA Size: 5.0 Tube type: Oral Number of attempts: 2 Placement Confirmation: positive ETCO2 Tube secured with: Tape Dental Injury: Teeth and Oropharynx as per pre-operative assessment

## 2015-01-06 NOTE — Op Note (Signed)
01/06/2015  11:42 AM  PATIENT:  [name]    PRE-OPERATIVE DIAGNOSIS: LEFT CARPAL TUNNEL SYNDROME POST-OPERATIVE DIAGNOSIS: LEFT CARPAL TUNNEL SYNDROME  PROCEDURE:  LEFT CARPAL TUNNEL RELEASE  SURGEON: Park Breed, MD  TOURNIQUET TIME:    ANESTHESIA:   General  PREOPERATIVE INDICATIONS:  [name] is a  59 y.o. male with a diagnosis of left carpal tunnel syndrome who failed conservative measures and elected for surgical management.    The risks benefits and alternatives were discussed with the patient preoperatively including but not limited to the risks of infection, bleeding, nerve injury, incomplete relief of symptoms, pillar pain, cardiopulmonary complications, the need for revision surgery, among others, and the patient was willing to proceed.  OPERATIVE FINDINGS: Thickened volar ligament and nerve compression.  OPERATIVE PROCEDURE: The patient is brought to the operating room placed in the supine position. General anesthesia was administered. The left upper extremity was prepped and draped in usual sterile fashion. Time out was performed. The arm was elevated and exsanguinated and the tourniquet was inflated. Incision was made in line with the radial border of the ring finger. The carpal tunnel transverse fascia was identified, cleaned, and incised sharply. The common sensory branches were visualized along with the superficial palmar arch and protected.  The median nerve was protected below. Deep retractors were placed underneath the transverse carpal ligament, protecting the nerve. I released the ligament completely, and then released the proximal distal volar forearm fascia. The nerve was identified, and visualized, and protected throughout the case. No masses or abnormalities were identified in ulnar bursa.  The wounds were irrigated copiously, and the wounds injected, and the skin closed with nylon followed by a volar splint and sterile gauze. Sponge and needle counts were  correct.  The patient tolerated this well, with no complications. Awakened and taken to recovery in good condition.

## 2015-01-06 NOTE — Anesthesia Preprocedure Evaluation (Addendum)
Anesthesia Evaluation  Patient identified by MRN, date of birth, ID band Patient awake    Reviewed: Allergy & Precautions, NPO status   Airway Mallampati: II  TM Distance: >3 FB Neck ROM: Full    Dental  (+) Chipped   Pulmonary former smoker,    Pulmonary exam normal breath sounds clear to auscultation       Cardiovascular hypertension, Pt. on medications and Pt. on home beta blockers Normal cardiovascular exam+ dysrhythmias Atrial Fibrillation      Neuro/Psych Carpal tunnel...lumbar-sacral radiculopathy  Neuromuscular disease    GI/Hepatic negative GI ROS, Neg liver ROS,   Endo/Other  negative endocrine ROS  Renal/GU stone  negative genitourinary   Musculoskeletal  (+) Arthritis , Osteoarthritis,    Abdominal Normal abdominal exam  (+)   Peds negative pediatric ROS (+)  Hematology  (+) anemia ,   Anesthesia Other Findings   Reproductive/Obstetrics                            Anesthesia Physical Anesthesia Plan  ASA: III  Anesthesia Plan: General   Post-op Pain Management:    Induction: Intravenous  Airway Management Planned: LMA  Additional Equipment:   Intra-op Plan:   Post-operative Plan:   Informed Consent:   Plan Discussed with:   Anesthesia Plan Comments:        Anesthesia Quick Evaluation

## 2015-01-06 NOTE — H&P (Signed)
THE PATIENT WAS SEEN IN THE HOLDING AREA.  HISTORY, ALLERGIES, HOME MEDICATIONS AND OPERATIVE PROCEDURE WERE REVIEWED. RISKS AND BENEFITS OF SURGERY DISCUSSED WITH PATIENT AGAIN.  NO CHANGES FROM INITIAL HISTORY AND PHYSICAL NOTED.    

## 2015-01-06 NOTE — Anesthesia Postprocedure Evaluation (Signed)
Anesthesia Post Note  Patient: [name]  Procedure(s) Performed: Procedure(s) (LRB): CARPAL TUNNEL RELEASE (Left)  Patient location during evaluation: PACU Anesthesia Type: General Level of consciousness: oriented and awake and alert Pain management: pain level controlled Vital Signs Assessment: post-procedure vital signs reviewed and stable Respiratory status: spontaneous breathing Cardiovascular status: blood pressure returned to baseline Postop Assessment: no headache Anesthetic complications: no    Last Vitals:  Filed Vitals:   01/06/15 1249 01/06/15 1331  BP: 126/92 116/71  Pulse: 49 55  Temp: 35.1 C   Resp: 16 18    Last Pain:  Filed Vitals:   01/06/15 1332  PainSc: 0-No pain                 Jode Lippe

## 2015-01-06 NOTE — Discharge Instructions (Signed)
AMBULATORY SURGERY  DISCHARGE INSTRUCTIONS   1) The drugs that you were given will stay in your system until tomorrow so for the next 24 hours you should not:  A) Drive an automobile B) Make any legal decisions C) Drink any alcoholic beverage   2) You may resume regular meals tomorrow.  Today it is better to start with liquids and gradually work up to solid foods.  You may eat anything you prefer, but it is better to start with liquids, then soup and crackers, and gradually work up to solid foods.   3) Please notify your doctor immediately if you have any unusual bleeding, trouble breathing, redness and pain at the surgery site, drainage, fever, or pain not relieved by medication.  4)  Keep dressing clean and dry, do not remove.  Keep left hand elevated.

## 2015-01-06 NOTE — Transfer of Care (Signed)
Immediate Anesthesia Transfer of Care Note  Patient: [name]  Procedure(s) Performed: Procedure(s): CARPAL TUNNEL RELEASE (Left)  Patient Location: PACU  Anesthesia Type:General  Level of Consciousness: awake, alert  and oriented  Airway & Oxygen Therapy: Patient Spontanous Breathing and Patient connected to face mask oxygen  Post-op Assessment: Report given to RN and Post -op Vital signs reviewed and stable  Post vital signs: Reviewed and stable  Last Vitals:  Filed Vitals:   01/06/15 0954  BP: 129/92  Pulse: 56  Temp: 36.2 C  Resp: 16    Complications: No apparent anesthesia complications

## 2015-01-08 ENCOUNTER — Telehealth: Payer: Self-pay

## 2015-01-08 NOTE — Telephone Encounter (Signed)
Spoke with MAC 

## 2015-02-07 ENCOUNTER — Other Ambulatory Visit: Payer: Self-pay | Admitting: Cardiovascular Disease

## 2015-03-13 ENCOUNTER — Other Ambulatory Visit: Payer: Self-pay | Admitting: Family Medicine

## 2015-03-27 ENCOUNTER — Other Ambulatory Visit: Payer: Self-pay | Admitting: Family Medicine

## 2015-03-29 NOTE — Telephone Encounter (Signed)
Call pt rx ready to pick up

## 2015-04-11 ENCOUNTER — Other Ambulatory Visit: Payer: Self-pay | Admitting: Cardiovascular Disease

## 2015-04-26 ENCOUNTER — Other Ambulatory Visit: Payer: Self-pay | Admitting: Family Medicine

## 2015-04-26 ENCOUNTER — Encounter: Payer: BLUE CROSS/BLUE SHIELD | Admitting: Family Medicine

## 2015-04-26 ENCOUNTER — Telehealth: Payer: Self-pay | Admitting: Family Medicine

## 2015-04-26 DIAGNOSIS — R918 Other nonspecific abnormal finding of lung field: Secondary | ICD-10-CM

## 2015-04-26 NOTE — Telephone Encounter (Signed)
Order needs to be changed to CT chest w/o contrast.

## 2015-04-26 NOTE — Telephone Encounter (Signed)
Call pt  What order and for what?   Expand All Collapse All   Order needs to be changed to CT chest w/o contrast.

## 2015-04-27 ENCOUNTER — Telehealth: Payer: Self-pay

## 2015-04-27 NOTE — Telephone Encounter (Signed)
Keep trying to call patient to get updated insurance information. The insurance information in patient's chart is not updated. No answer. Left message for patient to return my call.

## 2015-04-28 ENCOUNTER — Ambulatory Visit: Payer: BLUE CROSS/BLUE SHIELD

## 2015-04-28 ENCOUNTER — Other Ambulatory Visit: Payer: BLUE CROSS/BLUE SHIELD

## 2015-04-28 ENCOUNTER — Ambulatory Visit (INDEPENDENT_AMBULATORY_CARE_PROVIDER_SITE_OTHER): Payer: BLUE CROSS/BLUE SHIELD | Admitting: Family Medicine

## 2015-04-28 ENCOUNTER — Telehealth: Payer: Self-pay | Admitting: Family Medicine

## 2015-04-28 ENCOUNTER — Ambulatory Visit
Admission: RE | Admit: 2015-04-28 | Discharge: 2015-04-28 | Disposition: A | Discharge status: Home / Self Care | Payer: BLUE CROSS/BLUE SHIELD | Source: Ambulatory Visit | Attending: Family Medicine | Admitting: Family Medicine

## 2015-04-28 ENCOUNTER — Encounter: Payer: Self-pay | Admitting: Family Medicine

## 2015-04-28 VITALS — BP 115/70 | HR 62 | Temp 98.0°F | Ht 71.6 in | Wt 318.0 lb

## 2015-04-28 DIAGNOSIS — R918 Other nonspecific abnormal finding of lung field: Secondary | ICD-10-CM

## 2015-04-28 DIAGNOSIS — Z8546 Personal history of malignant neoplasm of prostate: Secondary | ICD-10-CM | POA: Diagnosis not present

## 2015-04-28 DIAGNOSIS — Z0000 Encounter for general adult medical examination without abnormal findings: Secondary | ICD-10-CM

## 2015-04-28 DIAGNOSIS — M5134 Other intervertebral disc degeneration, thoracic region: Secondary | ICD-10-CM | POA: Diagnosis not present

## 2015-04-28 DIAGNOSIS — K625 Hemorrhage of anus and rectum: Secondary | ICD-10-CM | POA: Diagnosis not present

## 2015-04-28 DIAGNOSIS — I2510 Atherosclerotic heart disease of native coronary artery without angina pectoris: Secondary | ICD-10-CM | POA: Insufficient documentation

## 2015-04-28 DIAGNOSIS — R938 Abnormal findings on diagnostic imaging of other specified body structures: Secondary | ICD-10-CM | POA: Insufficient documentation

## 2015-04-28 DIAGNOSIS — I4891 Unspecified atrial fibrillation: Secondary | ICD-10-CM | POA: Diagnosis not present

## 2015-04-28 DIAGNOSIS — R911 Solitary pulmonary nodule: Secondary | ICD-10-CM | POA: Diagnosis not present

## 2015-04-28 DIAGNOSIS — I10 Essential (primary) hypertension: Secondary | ICD-10-CM | POA: Diagnosis not present

## 2015-04-28 DIAGNOSIS — Z113 Encounter for screening for infections with a predominantly sexual mode of transmission: Secondary | ICD-10-CM

## 2015-04-28 DIAGNOSIS — K8020 Calculus of gallbladder without cholecystitis without obstruction: Secondary | ICD-10-CM | POA: Diagnosis not present

## 2015-04-28 DIAGNOSIS — C61 Malignant neoplasm of prostate: Secondary | ICD-10-CM | POA: Diagnosis not present

## 2015-04-28 LAB — URINALYSIS, ROUTINE W REFLEX MICROSCOPIC
Bilirubin, UA: NEGATIVE
Glucose, UA: NEGATIVE
Ketones, UA: NEGATIVE
LEUKOCYTES UA: NEGATIVE
NITRITE UA: NEGATIVE
PH UA: 5 (ref 5.0–7.5)
Protein, UA: NEGATIVE
RBC UA: NEGATIVE
SPEC GRAV UA: 1.025 (ref 1.005–1.030)
Urobilinogen, Ur: 0.2 mg/dL (ref 0.2–1.0)

## 2015-04-28 MED ORDER — CYANOCOBALAMIN 1000 MCG/ML IJ SOLN: 100.0000 ug | INTRAMUSCULAR | Status: DC

## 2015-04-28 MED ORDER — BENAZEPRIL HCL 40 MG PO TABS: 40.0000 mg | ORAL_TABLET | Freq: Every day | ORAL | Status: DC

## 2015-04-28 MED ORDER — CYANOCOBALAMIN 1000 MCG/ML IJ SOLN: 1000.0000 ug | INTRAMUSCULAR | Status: DC

## 2015-04-28 MED ORDER — TRAZODONE HCL 50 MG PO TABS: ORAL_TABLET | ORAL | Status: DC

## 2015-04-28 MED ORDER — AMLODIPINE BESYLATE 5 MG PO TABS: 5.0000 mg | ORAL_TABLET | Freq: Every day | ORAL | Status: DC

## 2015-04-28 MED ORDER — RIVAROXABAN 20 MG PO TABS: 20.0000 mg | ORAL_TABLET | Freq: Every day | ORAL | Status: DC

## 2015-04-28 NOTE — Assessment & Plan Note (Signed)
Discussed patient has follow-up with urology which we'll investigate further hematuria which patient done previously with cystoscopy etc.

## 2015-04-28 NOTE — Assessment & Plan Note (Signed)
The current medical regimen is effective;  continue present plan and medications.  

## 2015-04-28 NOTE — Progress Notes (Signed)
BP 115/70 mmHg  Pulse 62  Temp(Src) 98 F (36.7 C)  Ht 5' 11.6" (1.819 m)  Wt 318 lb (144.244 kg)  BMI 43.59 kg/m2  SpO2 97%   Subjective:    Patient ID: [name], male    DOB: [birth year], 60 y.o.   MRN: [id number]  HPI: [name] is a 60 y.o. male  Chief Complaint  Patient presents with  . Annual Exam  She taking Xarelto for atrial fibrillation doing well but is having some bright red rectal bleeding from time to time this is painless. Patient also having some red blood in his urine. This is also painless. This goes on maybe once a month or so. Patient's little over a year ago had seed implants in his prostate for prostate cancer. Has also had kidney stones. Patient had a normal colonoscopy about a year ago.  Patient taking tramadol for arthritis in his knees and wrists. Takes gabapentin for carpal tunnel neuropathy both hands after surgery Hypertension doing well no complaints from medications Trazodone for sleep doing okay at best takes occasional Tylenol PM with it Weight remains stable hasn't had problem of weight loss yet  Relevant past medical, surgical, family and social history reviewed and updated as indicated. Interim medical history since our last visit reviewed. Allergies and medications reviewed and updated.  Review of Systems  Constitutional: Negative.   HENT: Negative.   Eyes: Negative.   Respiratory: Negative.   Cardiovascular: Negative.   Gastrointestinal: Negative.   Endocrine: Negative.   Genitourinary: Negative.   Musculoskeletal: Negative.   Skin: Negative.   Allergic/Immunologic: Negative.   Neurological: Negative.   Hematological: Negative.   Psychiatric/Behavioral: Negative.     Per HPI unless specifically indicated above     Objective:    BP 115/70 mmHg  Pulse 62  Temp(Src) 98 F (36.7 C)  Ht 5' 11.6" (1.819 m)  Wt 318 lb (144.244 kg)  BMI 43.59 kg/m2  SpO2 97%  Wt Readings from Last 3 Encounters:  04/28/15 318 lb  (144.244 kg)  01/06/15 320 lb (145.151 kg)  12/21/14 320 lb (145.151 kg)    Physical Exam  Constitutional: He is oriented to person, place, and time. He appears well-developed and well-nourished.  HENT:  Head: Normocephalic and atraumatic.  Right Ear: External ear normal.  Left Ear: External ear normal.  Eyes: Conjunctivae and EOM are normal. Pupils are equal, round, and reactive to light.  Neck: Normal range of motion. Neck supple.  Cardiovascular: Normal rate, regular rhythm, normal heart sounds and intact distal pulses.   Pulmonary/Chest: Effort normal and breath sounds normal.  Abdominal: Soft. Bowel sounds are normal. There is no splenomegaly or hepatomegaly.  Musculoskeletal: Normal range of motion.  Ongoing chronic pain in both legs feet and hands as mentioned above.  Neurological: He is alert and oriented to person, place, and time. He has normal reflexes.  Skin: No rash noted. No erythema.  Psychiatric: He has a normal mood and affect. His behavior is normal. Judgment and thought content normal.    Results for orders placed or performed during the hospital encounter of 12/21/14  CBC  Result Value Ref Range   WBC 6.9 3.8 - 10.6 K/uL   RBC 5.22 4.40 - 5.90 MIL/uL   Hemoglobin 12.4 (L) 13.0 - 18.0 g/dL   HCT 39.2 (L) 40.0 - 52.0 %   MCV 75.1 (L) 80.0 - 100.0 fL   MCH 23.8 (L) 26.0 - 34.0 pg   MCHC 31.8 (L)  32.0 - 36.0 g/dL   RDW 16.2 (H) 11.5 - 14.5 %   Platelets 206 150 - 440 K/uL  Basic metabolic panel  Result Value Ref Range   Sodium 140 135 - 145 mmol/L   Potassium 4.5 3.5 - 5.1 mmol/L   Chloride 111 101 - 111 mmol/L   CO2 24 22 - 32 mmol/L   Glucose, Bld 93 65 - 99 mg/dL   BUN 18 6 - 20 mg/dL   Creatinine, Ser 0.91 0.61 - 1.24 mg/dL   Calcium 8.9 8.9 - 10.3 mg/dL   GFR calc non Af Amer >60 >60 mL/min   GFR calc Af Amer >60 >60 mL/min   Anion gap 5 5 - 15      Assessment & Plan:   Problem List Items Addressed This Visit      Cardiovascular and  Mediastinum   New onset a-fib (HCC)    The current medical regimen is effective;  continue present plan and medications.       Relevant Medications   amLODipine (NORVASC) 5 MG tablet   benazepril (LOTENSIN) 40 MG tablet   rivaroxaban (XARELTO) 20 MG TABS tablet   Essential hypertension    The current medical regimen is effective;  continue present plan and medications.       Relevant Medications   amLODipine (NORVASC) 5 MG tablet   benazepril (LOTENSIN) 40 MG tablet   rivaroxaban (XARELTO) 20 MG TABS tablet     Digestive   Rectal bleeding    Discuss rectal bleeding, patient has had colonoscopy has hemorrhoids will use his Anusol HC suppositories for a week or so Observe symptoms patient will also take iron over-the-counter.        Genitourinary   Prostate cancer [facility])    Discussed patient has follow-up with urology which we'll investigate further hematuria which patient done previously with cystoscopy etc.        Other   Morbid obesity ([place])    Discuss weight loss diet exercise and impact on joints and eating pain treatment. We will continue tramadol same dose.       Other Visit Diagnoses    Routine general medical examination at a health care facility    -  Primary    Relevant Orders    CBC with Differential/Platelet    Comprehensive metabolic panel    Lipid Panel w/o Chol/HDL Ratio    PSA    TSH    Urinalysis, Routine w reflex microscopic (not at [facility])    Routine screening for STI (sexually transmitted infection)        Relevant Orders    HIV antibody    Hepatitis C Antibody    Essential hypertension, benign        Relevant Medications    amLODipine (NORVASC) 5 MG tablet    benazepril (LOTENSIN) 40 MG tablet    rivaroxaban (XARELTO) 20 MG TABS tablet        Follow up plan: Return in about 6 months (around 10/28/2015) for BMP, lipids, ast, alt.

## 2015-04-28 NOTE — Telephone Encounter (Signed)
Pharmacy needs clarification for b12.

## 2015-04-28 NOTE — Assessment & Plan Note (Signed)
Discuss rectal bleeding, patient has had colonoscopy has hemorrhoids will use his Anusol HC suppositories for a week or so Observe symptoms patient will also take iron over-the-counter.

## 2015-04-28 NOTE — Assessment & Plan Note (Signed)
Discuss weight loss diet exercise and impact on joints and eating pain treatment. We will continue tramadol same dose.

## 2015-04-28 NOTE — Addendum Note (Signed)
Addended by: Rowe Clack H on: 04/28/2015 10:07 AM   Modules accepted: Miquel Dunn

## 2015-04-28 NOTE — Telephone Encounter (Signed)
Please check dosing for B12

## 2015-04-29 ENCOUNTER — Telehealth: Payer: Self-pay | Admitting: Family Medicine

## 2015-04-29 ENCOUNTER — Encounter: Payer: Self-pay | Admitting: Family Medicine

## 2015-04-29 LAB — COMPREHENSIVE METABOLIC PANEL
A/G RATIO: 2.3 — AB (ref 1.2–2.2)
ALK PHOS: 67 IU/L (ref 39–117)
ALT: 20 IU/L (ref 0–44)
AST: 16 IU/L (ref 0–40)
Albumin: 4.4 g/dL (ref 3.6–4.8)
BILIRUBIN TOTAL: 0.4 mg/dL (ref 0.0–1.2)
BUN/Creatinine Ratio: 16 (ref 10–24)
BUN: 17 mg/dL (ref 8–27)
CHLORIDE: 105 mmol/L (ref 96–106)
CO2: 24 mmol/L (ref 18–29)
CREATININE: 1.09 mg/dL (ref 0.76–1.27)
Calcium: 8.9 mg/dL (ref 8.6–10.2)
GFR calc Af Amer: 84 mL/min/{1.73_m2} (ref >59)
GFR calc non Af Amer: 73 mL/min/{1.73_m2} (ref >59)
GLOBULIN, TOTAL: 1.9 g/dL (ref 1.5–4.5)
Glucose: 115 mg/dL — ABNORMAL HIGH (ref 65–99)
POTASSIUM: 4.6 mmol/L (ref 3.5–5.2)
SODIUM: 143 mmol/L (ref 134–144)
Total Protein: 6.3 g/dL (ref 6.0–8.5)

## 2015-04-29 LAB — CBC WITH DIFFERENTIAL/PLATELET
Basophils Absolute: 0 10*3/uL (ref 0.0–0.2)
Basos: 1 %
EOS (ABSOLUTE): 0.1 10*3/uL (ref 0.0–0.4)
EOS: 1 %
HEMATOCRIT: 39 % (ref 37.5–51.0)
Hemoglobin: 12.2 g/dL — ABNORMAL LOW (ref 12.6–17.7)
Immature Grans (Abs): 0 10*3/uL (ref 0.0–0.1)
Immature Granulocytes: 0 %
LYMPHS ABS: 2.1 10*3/uL (ref 0.7–3.1)
Lymphs: 31 %
MCH: 23.8 pg — ABNORMAL LOW (ref 26.6–33.0)
MCHC: 31.3 g/dL — AB (ref 31.5–35.7)
MCV: 76 fL — ABNORMAL LOW (ref 79–97)
MONOS ABS: 0.7 10*3/uL (ref 0.1–0.9)
Monocytes: 10 %
Neutrophils Absolute: 3.7 10*3/uL (ref 1.4–7.0)
Neutrophils: 57 %
Platelets: 289 10*3/uL (ref 150–379)
RBC: 5.12 x10E6/uL (ref 4.14–5.80)
RDW: 16.6 % — AB (ref 12.3–15.4)
WBC: 6.6 10*3/uL (ref 3.4–10.8)

## 2015-04-29 LAB — LIPID PANEL W/O CHOL/HDL RATIO
CHOLESTEROL TOTAL: 169 mg/dL (ref 100–199)
HDL: 72 mg/dL (ref >39)
LDL Calculated: 71 mg/dL (ref 0–99)
TRIGLYCERIDES: 128 mg/dL (ref 0–149)
VLDL Cholesterol Cal: 26 mg/dL (ref 5–40)

## 2015-04-29 LAB — HIV ANTIBODY (ROUTINE TESTING W REFLEX): HIV Screen 4th Generation wRfx: NONREACTIVE

## 2015-04-29 LAB — HEPATITIS C ANTIBODY: Hep C Virus Ab: <0.1 s/co ratio (ref 0.0–0.9)

## 2015-04-29 LAB — PSA: Prostate Specific Ag, Serum: 0.7 ng/mL (ref 0.0–4.0)

## 2015-04-29 LAB — TSH: TSH: 1.43 u[IU]/mL (ref 0.450–4.500)

## 2015-04-29 NOTE — Telephone Encounter (Signed)
Phone call Discussed with patient CT scan normal with repeat in one year patient understands and will comply and remind [place] about CT scan needed.

## 2015-04-29 NOTE — Telephone Encounter (Signed)
CT authorization was obtained from [facility] and patient had CT scan.

## 2015-09-12 ENCOUNTER — Other Ambulatory Visit: Payer: Self-pay | Admitting: Family Medicine

## 2015-09-28 ENCOUNTER — Other Ambulatory Visit: Payer: Self-pay | Admitting: Family Medicine

## 2015-10-12 ENCOUNTER — Other Ambulatory Visit: Payer: Self-pay | Admitting: Family Medicine

## 2015-10-12 DIAGNOSIS — I4891 Unspecified atrial fibrillation: Secondary | ICD-10-CM

## 2015-10-28 ENCOUNTER — Encounter: Payer: Self-pay | Admitting: Family Medicine

## 2015-10-28 ENCOUNTER — Telehealth: Payer: Self-pay | Admitting: Family Medicine

## 2015-10-28 ENCOUNTER — Ambulatory Visit (INDEPENDENT_AMBULATORY_CARE_PROVIDER_SITE_OTHER): Payer: BLUE CROSS/BLUE SHIELD | Admitting: Family Medicine

## 2015-10-28 VITALS — BP 123/76 | HR 59 | Temp 98.4°F | Ht 71.0 in | Wt 320.2 lb

## 2015-10-28 DIAGNOSIS — I10 Essential (primary) hypertension: Secondary | ICD-10-CM

## 2015-10-28 DIAGNOSIS — D500 Iron deficiency anemia secondary to blood loss (chronic): Secondary | ICD-10-CM

## 2015-10-28 DIAGNOSIS — E785 Hyperlipidemia, unspecified: Secondary | ICD-10-CM

## 2015-10-28 DIAGNOSIS — I4891 Unspecified atrial fibrillation: Secondary | ICD-10-CM

## 2015-10-28 LAB — LP+ALT+AST PICCOLO, WAIVED
ALT (SGPT) PICCOLO, WAIVED: 15 U/L (ref 10–47)
AST (SGOT) PICCOLO, WAIVED: 24 U/L (ref 11–38)
CHOLESTEROL PICCOLO, WAIVED: 174 mg/dL (ref <200)
Chol/HDL Ratio Piccolo,Waive: 2.2 mg/dL
HDL CHOL PICCOLO, WAIVED: 81 mg/dL (ref >59)
LDL CHOL CALC PICCOLO WAIVED: 70 mg/dL (ref <100)
Triglycerides Piccolo,Waived: 118 mg/dL (ref <150)
VLDL Chol Calc Piccolo,Waive: 24 mg/dL (ref <30)

## 2015-10-28 NOTE — Assessment & Plan Note (Signed)
The current medical regimen is effective;  continue present plan and medications.  

## 2015-10-28 NOTE — Telephone Encounter (Signed)
Pt called to reschedule his physical on the 16th to the 17th of April 2018

## 2015-10-28 NOTE — Progress Notes (Signed)
BP 123/76 (BP Location: Left Arm, Patient Position: Sitting, Cuff Size: Normal)   Pulse (!) 59   Temp 98.4 F (36.9 C)   Ht 5\' 11"  (1.803 m)   Wt (!) 320 lb 3.2 oz (145.2 kg)   SpO2 97%   BMI 44.66 kg/m    Subjective:    Patient ID: [name], male    DOB: 02/13/1955, 60 y.o.   MRN: [id number]  HPI: [name] is a 60 y.o. male  Chief Complaint  Patient presents with  . Follow-up  . Hypertension  . Hyperlipidemia  Patient follow-up medical issues blood pressure cholesterol doing well no complaints from medications takes faithfully. Patient is having some issues with his right posterior wrist with some swelling tenderness discomforts going on area and been ongoing about a year. Patient's carpal tunnel surgery went well and neuropathy issues are resolving. No further rectal bleeding issues has been taking iron without problems.  Relevant past medical, surgical, family and social history reviewed and updated as indicated. Interim medical history since our last visit reviewed. Allergies and medications reviewed and updated.  Review of Systems  Constitutional: Negative.   Respiratory: Negative.   Cardiovascular: Negative.     Per HPI unless specifically indicated above     Objective:    BP 123/76 (BP Location: Left Arm, Patient Position: Sitting, Cuff Size: Normal)   Pulse (!) 59   Temp 98.4 F (36.9 C)   Ht 5\' 11"  (1.803 m)   Wt (!) 320 lb 3.2 oz (145.2 kg)   SpO2 97%   BMI 44.66 kg/m   Wt Readings from Last 3 Encounters:  10/28/15 (!) 320 lb 3.2 oz (145.2 kg)  04/28/15 (!) 318 lb (144.2 kg)  01/06/15 (!) 320 lb (145.2 kg)    Physical Exam  Constitutional: He is oriented to person, place, and time. He appears well-developed and well-nourished. No distress.  HENT:  Head: Normocephalic and atraumatic.  Right Ear: Hearing normal.  Left Ear: Hearing normal.  Nose: Nose normal.  Eyes: Conjunctivae and lids are normal. Right eye exhibits no discharge. Left  eye exhibits no discharge. No scleral icterus.  Cardiovascular: Normal rate, regular rhythm and normal heart sounds.   Pulmonary/Chest: Effort normal and breath sounds normal. No respiratory distress.  Musculoskeletal: Normal range of motion.  Neurological: He is alert and oriented to person, place, and time.  Skin: Skin is intact. No rash noted.  Psychiatric: He has a normal mood and affect. His speech is normal and behavior is normal. Judgment and thought content normal. Cognition and memory are normal.    Results for orders placed or performed in visit on 04/28/15  CBC with Differential/Platelet  Result Value Ref Range   WBC 6.6 3.4 - 10.8 x10E3/uL   RBC 5.12 4.14 - 5.80 x10E6/uL   Hemoglobin 12.2 (L) 12.6 - 17.7 g/dL   Hematocrit 39.0 37.5 - 51.0 %   MCV 76 (L) 79 - 97 fL   MCH 23.8 (L) 26.6 - 33.0 pg   MCHC 31.3 (L) 31.5 - 35.7 g/dL   RDW 16.6 (H) 12.3 - 15.4 %   Platelets 289 150 - 379 x10E3/uL   Neutrophils 57 %   Lymphs 31 %   Monocytes 10 %   Eos 1 %   Basos 1 %   Neutrophils Absolute 3.7 1.4 - 7.0 x10E3/uL   Lymphocytes Absolute 2.1 0.7 - 3.1 x10E3/uL   Monocytes Absolute 0.7 0.1 - 0.9 x10E3/uL   EOS (ABSOLUTE) 0.1  0.0 - 0.4 x10E3/uL   Basophils Absolute 0.0 0.0 - 0.2 x10E3/uL   Immature Granulocytes 0 %   Immature Grans (Abs) 0.0 0.0 - 0.1 x10E3/uL  Comprehensive metabolic panel  Result Value Ref Range   Glucose 115 (H) 65 - 99 mg/dL   BUN 17 8 - 27 mg/dL   Creatinine, Ser 1.09 0.76 - 1.27 mg/dL   GFR calc non Af Amer 73 >59 mL/min/1.73   GFR calc Af Amer 84 >59 mL/min/1.73   BUN/Creatinine Ratio 16 10 - 24   Sodium 143 134 - 144 mmol/L   Potassium 4.6 3.5 - 5.2 mmol/L   Chloride 105 96 - 106 mmol/L   CO2 24 18 - 29 mmol/L   Calcium 8.9 8.6 - 10.2 mg/dL   Total Protein 6.3 6.0 - 8.5 g/dL   Albumin 4.4 3.6 - 4.8 g/dL   Globulin, Total 1.9 1.5 - 4.5 g/dL   Albumin/Globulin Ratio 2.3 (H) 1.2 - 2.2   Bilirubin Total 0.4 0.0 - 1.2 mg/dL   Alkaline Phosphatase  67 39 - 117 IU/L   AST 16 0 - 40 IU/L   ALT 20 0 - 44 IU/L  Lipid Panel w/o Chol/HDL Ratio  Result Value Ref Range   Cholesterol, Total 169 100 - 199 mg/dL   Triglycerides 128 0 - 149 mg/dL   HDL 72 >39 mg/dL   VLDL Cholesterol Cal 26 5 - 40 mg/dL   LDL Calculated 71 0 - 99 mg/dL  PSA  Result Value Ref Range   Prostate Specific Ag, Serum 0.7 0.0 - 4.0 ng/mL  TSH  Result Value Ref Range   TSH 1.430 0.450 - 4.500 uIU/mL  Urinalysis, Routine w reflex microscopic (not at [facility])  Result Value Ref Range   Specific Gravity, UA 1.025 1.005 - 1.030   pH, UA 5.0 5.0 - 7.5   Color, UA Yellow Yellow   Appearance Ur Clear Clear   Leukocytes, UA Negative Negative   Protein, UA Negative Negative/Trace   Glucose, UA Negative Negative   Ketones, UA Negative Negative   RBC, UA Negative Negative   Bilirubin, UA Negative Negative   Urobilinogen, Ur 0.2 0.2 - 1.0 mg/dL   Nitrite, UA Negative Negative  HIV antibody  Result Value Ref Range   HIV Screen 4th Generation wRfx Non Reactive Non Reactive  Hepatitis C Antibody  Result Value Ref Range   Hep C Virus Ab <0.1 0.0 - 0.9 s/co ratio      Assessment & Plan:   Problem List Items Addressed This Visit      Cardiovascular and Mediastinum   New onset a-fib ([place])    Takes Xarelto without problems has noticed some increased bleeding issues but otherwise doing well      Essential hypertension - Primary    The current medical regimen is effective;  continue present plan and medications.       Relevant Orders   Basic metabolic panel     Other   Hyperlipidemia    The current medical regimen is effective;  continue present plan and medications.       Relevant Orders   LP+ALT+AST [place], [place]    Other Visit Diagnoses    Iron deficiency anemia due to chronic blood loss       Relevant Medications   ferrous gluconate (IRON 27) 240 (27 FE) MG tablet   Other Relevant Orders   CBC with Differential/Platelet       Follow up  plan: Return in about  6 months (around 04/27/2016) for Physical Exam.

## 2015-10-28 NOTE — Assessment & Plan Note (Signed)
Takes Xarelto without problems has noticed some increased bleeding issues but otherwise doing well

## 2015-10-29 LAB — CBC WITH DIFFERENTIAL/PLATELET
BASOS ABS: 0 10*3/uL (ref 0.0–0.2)
Basos: 1 %
EOS (ABSOLUTE): 0 10*3/uL (ref 0.0–0.4)
Eos: 1 %
HEMOGLOBIN: 14.7 g/dL (ref 12.6–17.7)
Hematocrit: 43 % (ref 37.5–51.0)
IMMATURE GRANS (ABS): 0 10*3/uL (ref 0.0–0.1)
Immature Granulocytes: 0 %
LYMPHS: 28 %
Lymphocytes Absolute: 1.6 10*3/uL (ref 0.7–3.1)
MCH: 29.9 pg (ref 26.6–33.0)
MCHC: 34.2 g/dL (ref 31.5–35.7)
MCV: 87 fL (ref 79–97)
MONOCYTES: 13 %
Monocytes Absolute: 0.8 10*3/uL (ref 0.1–0.9)
NEUTROS PCT: 57 %
Neutrophils Absolute: 3.4 10*3/uL (ref 1.4–7.0)
PLATELETS: 173 10*3/uL (ref 150–379)
RBC: 4.92 x10E6/uL (ref 4.14–5.80)
RDW: 14 % (ref 12.3–15.4)
WBC: 5.9 10*3/uL (ref 3.4–10.8)

## 2015-10-29 LAB — BASIC METABOLIC PANEL
BUN/Creatinine Ratio: 15 (ref 10–24)
BUN: 17 mg/dL (ref 8–27)
CALCIUM: 9.2 mg/dL (ref 8.6–10.2)
CHLORIDE: 103 mmol/L (ref 96–106)
CO2: 25 mmol/L (ref 18–29)
Creatinine, Ser: 1.13 mg/dL (ref 0.76–1.27)
GFR calc Af Amer: 80 mL/min/{1.73_m2} (ref >59)
GFR calc non Af Amer: 69 mL/min/{1.73_m2} (ref >59)
GLUCOSE: 108 mg/dL — AB (ref 65–99)
Potassium: 4.8 mmol/L (ref 3.5–5.2)
Sodium: 141 mmol/L (ref 134–144)

## 2015-10-30 ENCOUNTER — Other Ambulatory Visit: Payer: Self-pay | Admitting: Family Medicine

## 2015-10-30 DIAGNOSIS — I10 Essential (primary) hypertension: Secondary | ICD-10-CM

## 2015-11-01 ENCOUNTER — Encounter: Payer: Self-pay | Admitting: Family Medicine

## 2015-11-09 ENCOUNTER — Other Ambulatory Visit: Payer: Self-pay | Admitting: Cardiovascular Disease

## 2015-11-11 ENCOUNTER — Encounter (INDEPENDENT_AMBULATORY_CARE_PROVIDER_SITE_OTHER): Payer: Self-pay

## 2015-11-16 ENCOUNTER — Other Ambulatory Visit: Payer: Self-pay | Admitting: Cardiovascular Disease

## 2015-11-17 ENCOUNTER — Encounter (INDEPENDENT_AMBULATORY_CARE_PROVIDER_SITE_OTHER): Payer: Self-pay

## 2015-12-06 ENCOUNTER — Other Ambulatory Visit: Payer: Self-pay | Admitting: Cardiovascular Disease

## 2015-12-16 ENCOUNTER — Ambulatory Visit: Payer: BLUE CROSS/BLUE SHIELD | Admitting: Cardiovascular Disease

## 2015-12-20 ENCOUNTER — Ambulatory Visit (INDEPENDENT_AMBULATORY_CARE_PROVIDER_SITE_OTHER): Payer: 59 | Admitting: Cardiovascular Disease

## 2015-12-20 ENCOUNTER — Encounter: Payer: Self-pay | Admitting: Cardiovascular Disease

## 2015-12-20 VITALS — BP 128/72 | HR 55 | Ht 72.0 in | Wt 324.5 lb

## 2015-12-20 DIAGNOSIS — E782 Mixed hyperlipidemia: Secondary | ICD-10-CM | POA: Diagnosis not present

## 2015-12-20 DIAGNOSIS — Z7189 Other specified counseling: Secondary | ICD-10-CM | POA: Diagnosis not present

## 2015-12-20 DIAGNOSIS — I480 Paroxysmal atrial fibrillation: Secondary | ICD-10-CM

## 2015-12-20 DIAGNOSIS — I4819 Other persistent atrial fibrillation: Secondary | ICD-10-CM | POA: Insufficient documentation

## 2015-12-20 DIAGNOSIS — I10 Essential (primary) hypertension: Secondary | ICD-10-CM | POA: Diagnosis not present

## 2015-12-20 MED ORDER — AMLODIPINE BESYLATE 5 MG PO TABS: 5.0000 mg | ORAL_TABLET | Freq: Every day | ORAL | 3 refills | Status: DC

## 2015-12-20 MED ORDER — RIVAROXABAN 20 MG PO TABS: 20.0000 mg | ORAL_TABLET | Freq: Every day | ORAL | 3 refills | Status: DC

## 2015-12-20 MED ORDER — BENAZEPRIL HCL 40 MG PO TABS: 40.0000 mg | ORAL_TABLET | Freq: Every day | ORAL | 3 refills | Status: DC

## 2015-12-20 MED ORDER — ROSUVASTATIN CALCIUM 10 MG PO TABS: 10.0000 mg | ORAL_TABLET | Freq: Every day | ORAL | 3 refills | Status: DC

## 2015-12-20 MED ORDER — METOPROLOL SUCCINATE ER 25 MG PO TB24: 25.0000 mg | ORAL_TABLET | Freq: Every day | ORAL | 3 refills | Status: DC

## 2015-12-20 MED ORDER — AMIODARONE HCL 200 MG PO TABS: 200.0000 mg | ORAL_TABLET | Freq: Every day | ORAL | 3 refills | Status: DC

## 2015-12-20 MED ORDER — FUROSEMIDE 20 MG PO TABS: 20.0000 mg | ORAL_TABLET | Freq: Every day | ORAL | 3 refills | Status: DC | PRN
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
## 2015-12-20 NOTE — Patient Instructions (Signed)

## 2016-03-01 DIAGNOSIS — M19131 Post-traumatic osteoarthritis, right wrist: Secondary | ICD-10-CM | POA: Diagnosis not present

## 2016-04-06 ENCOUNTER — Other Ambulatory Visit: Payer: Self-pay | Admitting: Family Medicine

## 2016-04-21 ENCOUNTER — Other Ambulatory Visit: Payer: Self-pay | Admitting: Family Medicine

## 2016-04-25 ENCOUNTER — Other Ambulatory Visit: Payer: Self-pay | Admitting: Family Medicine

## 2016-04-26 NOTE — Telephone Encounter (Signed)
Pt called and stated that the pharmacy did not get it.  He would like to know if it could be faxed again.

## 2016-04-26 NOTE — Telephone Encounter (Signed)
It looks like 300 tabs were sent in last month, so he should not need any more. Will keep this here for him to review upon his return.

## 2016-04-26 NOTE — Telephone Encounter (Signed)
  Last routine OV: 10/28/15 Next OV: 05/22/16

## 2016-04-27 NOTE — Telephone Encounter (Signed)
Called pharmacy. They did not receive the Rx for 300 tablets form 04/10/16. Ok to call in?

## 2016-04-28 NOTE — Telephone Encounter (Signed)
Old RX called in.

## 2016-04-28 NOTE — Telephone Encounter (Signed)
Ok to call in his script that has been written, just not comfortable re-writing it

## 2016-05-01 ENCOUNTER — Encounter: Payer: BLUE CROSS/BLUE SHIELD | Admitting: Family Medicine

## 2016-05-02 ENCOUNTER — Encounter: Payer: Self-pay | Admitting: Family Medicine

## 2016-05-06 ENCOUNTER — Other Ambulatory Visit: Payer: Self-pay | Admitting: Family Medicine

## 2016-05-15 ENCOUNTER — Other Ambulatory Visit: Payer: Self-pay | Admitting: Family Medicine

## 2016-05-15 DIAGNOSIS — I10 Essential (primary) hypertension: Secondary | ICD-10-CM

## 2016-05-22 ENCOUNTER — Encounter: Payer: Self-pay | Admitting: Family Medicine

## 2016-05-22 ENCOUNTER — Ambulatory Visit (INDEPENDENT_AMBULATORY_CARE_PROVIDER_SITE_OTHER): Payer: 59 | Admitting: Family Medicine

## 2016-05-22 VITALS — BP 120/79 | HR 79 | Ht 72.0 in | Wt 321.0 lb

## 2016-05-22 DIAGNOSIS — M1731 Unilateral post-traumatic osteoarthritis, right knee: Secondary | ICD-10-CM | POA: Diagnosis not present

## 2016-05-22 DIAGNOSIS — Z0000 Encounter for general adult medical examination without abnormal findings: Secondary | ICD-10-CM

## 2016-05-22 DIAGNOSIS — E782 Mixed hyperlipidemia: Secondary | ICD-10-CM | POA: Diagnosis not present

## 2016-05-22 DIAGNOSIS — I480 Paroxysmal atrial fibrillation: Secondary | ICD-10-CM

## 2016-05-22 DIAGNOSIS — Z1329 Encounter for screening for other suspected endocrine disorder: Secondary | ICD-10-CM | POA: Diagnosis not present

## 2016-05-22 DIAGNOSIS — Z13220 Encounter for screening for lipoid disorders: Secondary | ICD-10-CM | POA: Diagnosis not present

## 2016-05-22 DIAGNOSIS — I10 Essential (primary) hypertension: Secondary | ICD-10-CM

## 2016-05-22 DIAGNOSIS — E538 Deficiency of other specified B group vitamins: Secondary | ICD-10-CM | POA: Diagnosis not present

## 2016-05-22 DIAGNOSIS — C61 Malignant neoplasm of prostate: Secondary | ICD-10-CM

## 2016-05-22 DIAGNOSIS — D510 Vitamin B12 deficiency anemia due to intrinsic factor deficiency: Secondary | ICD-10-CM | POA: Insufficient documentation

## 2016-05-22 DIAGNOSIS — M19131 Post-traumatic osteoarthritis, right wrist: Secondary | ICD-10-CM | POA: Diagnosis not present

## 2016-05-22 LAB — MICROSCOPIC EXAMINATION
BACTERIA UA: NONE SEEN
RBC, UA: NONE SEEN /hpf (ref 0–?)
WBC, UA: NONE SEEN /hpf (ref 0–?)

## 2016-05-22 LAB — URINALYSIS, ROUTINE W REFLEX MICROSCOPIC
BILIRUBIN UA: NEGATIVE
Glucose, UA: NEGATIVE
KETONES UA: NEGATIVE
LEUKOCYTES UA: NEGATIVE
Nitrite, UA: NEGATIVE
RBC UA: NEGATIVE
Urobilinogen, Ur: 0.2 mg/dL (ref 0.2–1.0)
pH, UA: 5 (ref 5.0–7.5)

## 2016-05-22 MED ORDER — TRAMADOL HCL 50 MG PO TABS: 50.0000 mg | ORAL_TABLET | Freq: Four times a day (QID) | ORAL | 5 refills | Status: DC | PRN

## 2016-05-22 MED ORDER — TRAZODONE HCL 50 MG PO TABS: 50.0000 mg | ORAL_TABLET | Freq: Every evening | ORAL | 4 refills | Status: DC | PRN

## 2016-05-22 MED ORDER — CYANOCOBALAMIN 1000 MCG/ML IJ SOLN: 1000.0000 ug | INTRAMUSCULAR | 12 refills | Status: DC

## 2016-05-22 NOTE — Assessment & Plan Note (Signed)
The current medical regimen is effective;  continue present plan and medications.  

## 2016-05-22 NOTE — Progress Notes (Signed)
BP 120/79   Pulse 79   Ht 6' (1.829 m)   Wt (!) 321 lb (145.6 kg)   SpO2 96%   BMI 43.54 kg/m    Subjective:    Patient ID: [name], male    DOB: 01/27/1955, 61 y.o.   MRN: [id number]  HPI: [name] is a 61 y.o. male  Chief Complaint  Patient presents with  . Annual Exam  Patient follow-up hypertension doing well no complaints from medications good control of blood pressure Taking blood thinner for atrial fibrillation and stable has occasional bright red rectal bleeding has had normal colonoscopy without concern patient will go months without any signs or symptoms and have for a few days. Takes Crestor for cholesterol without problems. Trazodone for sleep does okay   Relevant past medical, surgical, family and social history reviewed and updated as indicated. Interim medical history since our last visit reviewed. Allergies and medications reviewed and updated.  Review of Systems  Constitutional: Negative.   HENT: Negative.   Eyes: Negative.   Respiratory: Negative.   Cardiovascular: Negative.   Gastrointestinal: Negative.   Endocrine: Negative.   Genitourinary: Negative.   Musculoskeletal: Negative.   Skin: Negative.   Allergic/Immunologic: Negative.   Neurological: Negative.   Hematological: Negative.   Psychiatric/Behavioral: Negative.     Per HPI unless specifically indicated above     Objective:    BP 120/79   Pulse 79   Ht 6' (1.829 m)   Wt (!) 321 lb (145.6 kg)   SpO2 96%   BMI 43.54 kg/m   Wt Readings from Last 3 Encounters:  05/22/16 (!) 321 lb (145.6 kg)  12/20/15 (!) 324 lb 8 oz (147.2 kg)  10/28/15 (!) 320 lb 3.2 oz (145.2 kg)    Physical Exam  Constitutional: He is oriented to person, place, and time. He appears well-developed and well-nourished.  HENT:  Head: Normocephalic and atraumatic.  Right Ear: External ear normal.  Left Ear: External ear normal.  Eyes: Conjunctivae and EOM are normal. Pupils are equal, round, and  reactive to light.  Neck: Normal range of motion. Neck supple.  Cardiovascular: Normal rate, regular rhythm, normal heart sounds and intact distal pulses.   Pulmonary/Chest: Effort normal and breath sounds normal.  Abdominal: Soft. Bowel sounds are normal. There is no splenomegaly or hepatomegaly.  Genitourinary: Rectum normal, prostate normal and penis normal.  Musculoskeletal: Normal range of motion.  Neurological: He is alert and oriented to person, place, and time. He has normal reflexes.  Skin: No rash noted. No erythema.  Psychiatric: He has a normal mood and affect. His behavior is normal. Judgment and thought content normal.    Results for orders placed or performed in visit on 37/85/88  Basic metabolic panel  Result Value Ref Range   Glucose 108 (H) 65 - 99 mg/dL   BUN 17 8 - 27 mg/dL   Creatinine, Ser 1.13 0.76 - 1.27 mg/dL   GFR calc non Af Amer 69 >59 mL/min/1.73   GFR calc Af Amer 80 >59 mL/min/1.73   BUN/Creatinine Ratio 15 10 - 24   Sodium 141 134 - 144 mmol/L   Potassium 4.8 3.5 - 5.2 mmol/L   Chloride 103 96 - 106 mmol/L   CO2 25 18 - 29 mmol/L   Calcium 9.2 8.6 - 10.2 mg/dL  LP+ALT+AST Piccolo, Waived  Result Value Ref Range   ALT (SGPT) Piccolo, Waived 15 10 - 47 U/L   AST (SGOT) Piccolo, Waived 24  11 - 38 U/L   Cholesterol Piccolo, Waived 174 <200 mg/dL   HDL Chol Piccolo, Waived 81 >59 mg/dL   Triglycerides Piccolo,Waived 118 <150 mg/dL   Chol/HDL Ratio Piccolo,Waive 2.2 mg/dL   LDL Chol Calc Piccolo Waived 70 <100 mg/dL   VLDL Chol Calc Piccolo,Waive 24 <30 mg/dL  CBC with Differential/Platelet  Result Value Ref Range   WBC 5.9 3.4 - 10.8 x10E3/uL   RBC 4.92 4.14 - 5.80 x10E6/uL   Hemoglobin 14.7 12.6 - 17.7 g/dL   Hematocrit 43.0 37.5 - 51.0 %   MCV 87 79 - 97 fL   MCH 29.9 26.6 - 33.0 pg   MCHC 34.2 31.5 - 35.7 g/dL   RDW 14.0 12.3 - 15.4 %   Platelets 173 150 - 379 x10E3/uL   Neutrophils 57 Not Estab. %   Lymphs 28 Not Estab. %   Monocytes 13  Not Estab. %   Eos 1 Not Estab. %   Basos 1 Not Estab. %   Neutrophils Absolute 3.4 1.4 - 7.0 x10E3/uL   Lymphocytes Absolute 1.6 0.7 - 3.1 x10E3/uL   Monocytes Absolute 0.8 0.1 - 0.9 x10E3/uL   EOS (ABSOLUTE) 0.0 0.0 - 0.4 x10E3/uL   Basophils Absolute 0.0 0.0 - 0.2 x10E3/uL   Immature Granulocytes 0 Not Estab. %   Immature Grans (Abs) 0.0 0.0 - 0.1 x10E3/uL      Assessment & Plan:   Problem List Items Addressed This Visit      Cardiovascular and Mediastinum   Essential hypertension    The current medical regimen is effective;  continue present plan and medications.       Relevant Orders   CBC with Differential/Platelet   Comprehensive metabolic panel   Urinalysis, Routine w reflex microscopic   Paroxysmal atrial fibrillation (HCC)    The current medical regimen is effective;  continue present plan and medications.         Musculoskeletal and Integument   DJD (degenerative joint disease) of knee    Discussed chronic pain needs legs right wrist. Patient taking tramadol 3-4 a day. Discussed chronic nature of pain and use of tramadol with cautions cause of controlled drug.      Relevant Medications   traMADol (ULTRAM) 50 MG tablet     Genitourinary   Prostate cancer ([place])   Relevant Orders   PSA     Other   Morbid obesity ([place])    Discuss wt loss diet      Hyperlipidemia    The current medical regimen is effective;  continue present plan and medications.       Relevant Orders   CBC with Differential/Platelet   Comprehensive metabolic panel   Lipid panel   TSH   Urinalysis, Routine w reflex microscopic   Pernicious anemia    Stable on vitamin B12      Relevant Medications   cyanocobalamin (,VITAMIN B-12,) 1000 MCG/ML injection    Other Visit Diagnoses    Annual physical exam    -  Primary   Relevant Orders   CBC with Differential/Platelet   Comprehensive metabolic panel   Lipid panel   PSA   TSH   Urinalysis, Routine w reflex microscopic    Screening cholesterol level       Relevant Orders   Lipid panel   Thyroid disorder screen       Relevant Orders   TSH   B12 deficiency       Relevant Orders   Vitamin B12  Follow up plan: Return in about 6 months (around 11/22/2016) for BMP,  Lipids, ALT, AST.

## 2016-05-22 NOTE — Assessment & Plan Note (Signed)
Discussed chronic pain needs legs right wrist. Patient taking tramadol 3-4 a day. Discussed chronic nature of pain and use of tramadol with cautions cause of controlled drug.

## 2016-05-22 NOTE — Assessment & Plan Note (Signed)
Stable on vitamin B12

## 2016-05-22 NOTE — Assessment & Plan Note (Signed)
Discuss wtloss diet 

## 2016-05-23 ENCOUNTER — Encounter: Payer: Self-pay | Admitting: Family Medicine

## 2016-05-23 LAB — CBC WITH DIFFERENTIAL/PLATELET
Basophils Absolute: 0 10*3/uL (ref 0.0–0.2)
Basos: 0 %
EOS (ABSOLUTE): 0 10*3/uL (ref 0.0–0.4)
Eos: 0 %
HEMATOCRIT: 47 % (ref 37.5–51.0)
HEMOGLOBIN: 15.4 g/dL (ref 13.0–17.7)
IMMATURE GRANS (ABS): 0 10*3/uL (ref 0.0–0.1)
IMMATURE GRANULOCYTES: 0 %
LYMPHS ABS: 1.7 10*3/uL (ref 0.7–3.1)
Lymphs: 24 %
MCH: 29.7 pg (ref 26.6–33.0)
MCHC: 32.8 g/dL (ref 31.5–35.7)
MCV: 91 fL (ref 79–97)
Monocytes Absolute: 0.5 10*3/uL (ref 0.1–0.9)
Monocytes: 7 %
NEUTROS ABS: 4.7 10*3/uL (ref 1.4–7.0)
NEUTROS PCT: 69 %
PLATELETS: 190 10*3/uL (ref 150–379)
RBC: 5.19 x10E6/uL (ref 4.14–5.80)
RDW: 13.7 % (ref 12.3–15.4)
WBC: 6.8 10*3/uL (ref 3.4–10.8)

## 2016-05-23 LAB — COMPREHENSIVE METABOLIC PANEL
A/G RATIO: 2.4 — AB (ref 1.2–2.2)
ALBUMIN: 4.5 g/dL (ref 3.6–4.8)
ALT: 20 IU/L (ref 0–44)
AST: 19 IU/L (ref 0–40)
Alkaline Phosphatase: 62 IU/L (ref 39–117)
BUN/Creatinine Ratio: 17 (ref 10–24)
BUN: 19 mg/dL (ref 8–27)
Bilirubin Total: 0.5 mg/dL (ref 0.0–1.2)
CALCIUM: 8.9 mg/dL (ref 8.6–10.2)
CO2: 22 mmol/L (ref 18–29)
Chloride: 104 mmol/L (ref 96–106)
Creatinine, Ser: 1.15 mg/dL (ref 0.76–1.27)
GFR, EST AFRICAN AMERICAN: 78 mL/min/{1.73_m2} (ref >59)
GFR, EST NON AFRICAN AMERICAN: 68 mL/min/{1.73_m2} (ref >59)
GLOBULIN, TOTAL: 1.9 g/dL (ref 1.5–4.5)
Glucose: 151 mg/dL — ABNORMAL HIGH (ref 65–99)
POTASSIUM: 4.3 mmol/L (ref 3.5–5.2)
Sodium: 143 mmol/L (ref 134–144)
TOTAL PROTEIN: 6.4 g/dL (ref 6.0–8.5)

## 2016-05-23 LAB — PSA: PROSTATE SPECIFIC AG, SERUM: 0.3 ng/mL (ref 0.0–4.0)

## 2016-05-23 LAB — LIPID PANEL
CHOL/HDL RATIO: 2.3 ratio (ref 0.0–5.0)
Cholesterol, Total: 191 mg/dL (ref 100–199)
HDL: 82 mg/dL (ref >39)
LDL Calculated: 88 mg/dL (ref 0–99)
Triglycerides: 104 mg/dL (ref 0–149)
VLDL CHOLESTEROL CAL: 21 mg/dL (ref 5–40)

## 2016-05-23 LAB — VITAMIN B12: Vitamin B-12: 345 pg/mL (ref 232–1245)

## 2016-05-23 LAB — TSH: TSH: 1.53 u[IU]/mL (ref 0.450–4.500)

## 2016-07-22 ENCOUNTER — Other Ambulatory Visit: Payer: Self-pay | Admitting: Cardiovascular Disease

## 2016-11-14 DIAGNOSIS — M25539 Pain in unspecified wrist: Secondary | ICD-10-CM | POA: Diagnosis not present

## 2016-11-23 DIAGNOSIS — M19039 Primary osteoarthritis, unspecified wrist: Secondary | ICD-10-CM | POA: Diagnosis not present

## 2016-12-04 ENCOUNTER — Encounter: Payer: Self-pay | Admitting: Family Medicine

## 2016-12-04 ENCOUNTER — Ambulatory Visit: Payer: 59 | Admitting: Family Medicine

## 2016-12-04 VITALS — BP 120/74 | HR 60 | Wt 317.0 lb

## 2016-12-04 DIAGNOSIS — M1731 Unilateral post-traumatic osteoarthritis, right knee: Secondary | ICD-10-CM

## 2016-12-04 DIAGNOSIS — I10 Essential (primary) hypertension: Secondary | ICD-10-CM | POA: Diagnosis not present

## 2016-12-04 DIAGNOSIS — E782 Mixed hyperlipidemia: Secondary | ICD-10-CM | POA: Diagnosis not present

## 2016-12-04 LAB — LP+ALT+AST PICCOLO, WAIVED
ALT (SGPT) Piccolo, Waived: 16 U/L (ref 10–47)
AST (SGOT) Piccolo, Waived: 26 U/L (ref 11–38)
CHOL/HDL RATIO PICCOLO,WAIVE: 2.1 mg/dL
Cholesterol Piccolo, Waived: 195 mg/dL (ref <200)
HDL CHOL PICCOLO, WAIVED: 93 mg/dL (ref >59)
LDL CHOL CALC PICCOLO WAIVED: 73 mg/dL (ref <100)
TRIGLYCERIDES PICCOLO,WAIVED: 147 mg/dL (ref <150)
VLDL CHOL CALC PICCOLO,WAIVE: 29 mg/dL (ref <30)

## 2016-12-04 MED ORDER — TRAMADOL HCL 50 MG PO TABS: 50.0000 mg | ORAL_TABLET | Freq: Four times a day (QID) | ORAL | 5 refills | Status: DC | PRN

## 2016-12-04 NOTE — Assessment & Plan Note (Signed)
The current medical regimen is effective;  continue present plan and medications.  

## 2016-12-04 NOTE — Progress Notes (Signed)
BP 120/74   Pulse 60   Wt (!) 317 lb (143.8 kg)   SpO2 97%   BMI 42.99 kg/m    Subjective:    Patient ID: [name], male    DOB: 1955/02/15, 62 y.o.   MRN: [id number]  HPI: [name] is a 61 y.o. male  Chief Complaint  Patient presents with  . Follow-up  . Diarrhea  patient follow-up medical conditions CAD hypertension hypercholesterol also stable.Patient's chronic pain wrist shoulder and knees stable takes tramadolwhich helps some days takes for some days takes 2. Current tramadol prescription has been adequate. Has looked at wrist replacement surgery and for now not electing to do any surgery because ofrisk and continues to do same job which aggravates in the first place.  Relevant past medical, surgical, family and social history reviewed and updated as indicated. Interim medical history since our last visit reviewed. Allergies and medications reviewed and updated.  Review of Systems  Constitutional: Negative.   Respiratory: Negative.   Cardiovascular: Negative.     Per HPI unless specifically indicated above     Objective:    BP 120/74   Pulse 60   Wt (!) 317 lb (143.8 kg)   SpO2 97%   BMI 42.99 kg/m   Wt Readings from Last 3 Encounters:  12/04/16 (!) 317 lb (143.8 kg)  05/22/16 (!) 321 lb (145.6 kg)  12/20/15 (!) 324 lb 8 oz (147.2 kg)    Physical Exam  Constitutional: He is oriented to person, place, and time. He appears well-developed and well-nourished.  HENT:  Head: Normocephalic and atraumatic.  Eyes: Conjunctivae and EOM are normal.  Neck: Normal range of motion.  Cardiovascular: Normal rate, regular rhythm and normal heart sounds.  Pulmonary/Chest: Effort normal and breath sounds normal.  Musculoskeletal: Normal range of motion.  Neurological: He is alert and oriented to person, place, and time.  Skin: No erythema.  Psychiatric: He has a normal mood and affect. His behavior is normal. Judgment and thought content normal.    Results for  orders placed or performed in visit on 05/22/16  Microscopic Examination  Result Value Ref Range   WBC, UA None seen 0 - 5 /hpf   RBC, UA None seen 0 - 2 /hpf   Epithelial Cells (non renal) CANCELED    Bacteria, UA None seen None seen/Few  CBC with Differential/Platelet  Result Value Ref Range   WBC 6.8 3.4 - 10.8 x10E3/uL   RBC 5.19 4.14 - 5.80 x10E6/uL   Hemoglobin 15.4 13.0 - 17.7 g/dL   Hematocrit 47.0 37.5 - 51.0 %   MCV 91 79 - 97 fL   MCH 29.7 26.6 - 33.0 pg   MCHC 32.8 31.5 - 35.7 g/dL   RDW 13.7 12.3 - 15.4 %   Platelets 190 150 - 379 x10E3/uL   Neutrophils 69 Not Estab. %   Lymphs 24 Not Estab. %   Monocytes 7 Not Estab. %   Eos 0 Not Estab. %   Basos 0 Not Estab. %   Neutrophils Absolute 4.7 1.4 - 7.0 x10E3/uL   Lymphocytes Absolute 1.7 0.7 - 3.1 x10E3/uL   Monocytes Absolute 0.5 0.1 - 0.9 x10E3/uL   EOS (ABSOLUTE) 0.0 0.0 - 0.4 x10E3/uL   Basophils Absolute 0.0 0.0 - 0.2 x10E3/uL   Immature Granulocytes 0 Not Estab. %   Immature Grans (Abs) 0.0 0.0 - 0.1 x10E3/uL  Comprehensive metabolic panel  Result Value Ref Range   Glucose 151 (H) 65 -  99 mg/dL   BUN 19 8 - 27 mg/dL   Creatinine, Ser 1.15 0.76 - 1.27 mg/dL   GFR calc non Af Amer 68 >59 mL/min/1.73   GFR calc Af Amer 78 >59 mL/min/1.73   BUN/Creatinine Ratio 17 10 - 24   Sodium 143 134 - 144 mmol/L   Potassium 4.3 3.5 - 5.2 mmol/L   Chloride 104 96 - 106 mmol/L   CO2 22 18 - 29 mmol/L   Calcium 8.9 8.6 - 10.2 mg/dL   Total Protein 6.4 6.0 - 8.5 g/dL   Albumin 4.5 3.6 - 4.8 g/dL   Globulin, Total 1.9 1.5 - 4.5 g/dL   Albumin/Globulin Ratio 2.4 (H) 1.2 - 2.2   Bilirubin Total 0.5 0.0 - 1.2 mg/dL   Alkaline Phosphatase 62 39 - 117 IU/L   AST 19 0 - 40 IU/L   ALT 20 0 - 44 IU/L  Lipid panel  Result Value Ref Range   Cholesterol, Total 191 100 - 199 mg/dL   Triglycerides 104 0 - 149 mg/dL   HDL 82 >39 mg/dL   VLDL Cholesterol Cal 21 5 - 40 mg/dL   LDL Calculated 88 0 - 99 mg/dL   Chol/HDL Ratio 2.3  0.0 - 5.0 ratio  PSA  Result Value Ref Range   Prostate Specific Ag, Serum 0.3 0.0 - 4.0 ng/mL  TSH  Result Value Ref Range   TSH 1.530 0.450 - 4.500 uIU/mL  Urinalysis, Routine w reflex microscopic  Result Value Ref Range   Specific Gravity, UA >1.030 (H) 1.005 - 1.030   pH, UA 5.0 5.0 - 7.5   Color, UA Yellow Yellow   Appearance Ur Clear Clear   Leukocytes, UA Negative Negative   Protein, UA 1+ (A) Negative/Trace   Glucose, UA Negative Negative   Ketones, UA Negative Negative   RBC, UA Negative Negative   Bilirubin, UA Negative Negative   Urobilinogen, Ur 0.2 0.2 - 1.0 mg/dL   Nitrite, UA Negative Negative   Microscopic Examination See below:   Vitamin B12  Result Value Ref Range   Vitamin B-12 345 232 - 1,245 pg/mL      Assessment & Plan:   Problem List Items Addressed This Visit      Cardiovascular and Mediastinum   Essential hypertension - Primary    The current medical regimen is effective;  continue present plan and medications.       Relevant Orders   Basic metabolic panel   LP+ALT+AST Piccolo, Waived     Musculoskeletal and Integument   DJD (degenerative joint disease) of knee   Relevant Medications   traMADol (ULTRAM) 50 MG tablet     Other   Hyperlipidemia    The current medical regimen is effective;  continue present plan and medications.       Relevant Orders   Basic metabolic panel   LP+ALT+AST Piccolo, Waived       Follow up plan: Return in about 6 months (around 06/03/2017) for Physical Exam.

## 2016-12-05 ENCOUNTER — Encounter: Payer: Self-pay | Admitting: Family Medicine

## 2016-12-05 LAB — BASIC METABOLIC PANEL
BUN/Creatinine Ratio: 15 (ref 10–24)
BUN: 17 mg/dL (ref 8–27)
CALCIUM: 9 mg/dL (ref 8.6–10.2)
CO2: 24 mmol/L (ref 20–29)
Chloride: 108 mmol/L — ABNORMAL HIGH (ref 96–106)
Creatinine, Ser: 1.11 mg/dL (ref 0.76–1.27)
GFR, EST AFRICAN AMERICAN: 81 mL/min/{1.73_m2} (ref >59)
GFR, EST NON AFRICAN AMERICAN: 70 mL/min/{1.73_m2} (ref >59)
Glucose: 84 mg/dL (ref 65–99)
Potassium: 5.2 mmol/L (ref 3.5–5.2)
Sodium: 144 mmol/L (ref 134–144)

## 2016-12-12 DIAGNOSIS — Z23 Encounter for immunization: Secondary | ICD-10-CM | POA: Diagnosis not present

## 2016-12-16 ENCOUNTER — Other Ambulatory Visit: Payer: Self-pay | Admitting: Cardiovascular Disease

## 2017-01-07 ENCOUNTER — Other Ambulatory Visit: Payer: Self-pay | Admitting: Cardiovascular Disease

## 2017-01-07 DIAGNOSIS — I480 Paroxysmal atrial fibrillation: Secondary | ICD-10-CM

## 2017-01-10 DIAGNOSIS — M1711 Unilateral primary osteoarthritis, right knee: Secondary | ICD-10-CM | POA: Diagnosis not present

## 2017-01-10 DIAGNOSIS — D400 Neoplasm of uncertain behavior of prostate: Secondary | ICD-10-CM | POA: Diagnosis not present

## 2017-01-10 DIAGNOSIS — C61 Malignant neoplasm of prostate: Secondary | ICD-10-CM | POA: Diagnosis not present

## 2017-01-10 DIAGNOSIS — R361 Hematospermia: Secondary | ICD-10-CM | POA: Diagnosis not present

## 2017-01-10 DIAGNOSIS — M25531 Pain in right wrist: Secondary | ICD-10-CM | POA: Diagnosis not present

## 2017-01-13 ENCOUNTER — Other Ambulatory Visit: Payer: Self-pay | Admitting: Cardiovascular Disease

## 2017-01-13 DIAGNOSIS — I10 Essential (primary) hypertension: Secondary | ICD-10-CM

## 2017-02-03 ENCOUNTER — Other Ambulatory Visit: Payer: Self-pay | Admitting: Cardiovascular Disease

## 2017-02-03 ENCOUNTER — Other Ambulatory Visit: Payer: Self-pay | Admitting: Family Medicine

## 2017-02-03 DIAGNOSIS — I480 Paroxysmal atrial fibrillation: Secondary | ICD-10-CM

## 2017-02-03 DIAGNOSIS — I10 Essential (primary) hypertension: Secondary | ICD-10-CM

## 2017-02-05 NOTE — Telephone Encounter (Signed)
Refill Request.  
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
## 2017-02-18 ENCOUNTER — Other Ambulatory Visit: Payer: Self-pay | Admitting: Cardiovascular Disease

## 2017-02-18 DIAGNOSIS — I10 Essential (primary) hypertension: Secondary | ICD-10-CM

## 2017-02-19 ENCOUNTER — Encounter: Payer: Self-pay | Admitting: Cardiovascular Disease

## 2017-02-19 ENCOUNTER — Ambulatory Visit (INDEPENDENT_AMBULATORY_CARE_PROVIDER_SITE_OTHER): Payer: 59 | Admitting: Cardiovascular Disease

## 2017-02-19 VITALS — BP 124/74 | HR 65 | Ht 72.0 in | Wt 331.8 lb

## 2017-02-19 DIAGNOSIS — E782 Mixed hyperlipidemia: Secondary | ICD-10-CM

## 2017-02-19 DIAGNOSIS — Z7189 Other specified counseling: Secondary | ICD-10-CM

## 2017-02-19 DIAGNOSIS — I10 Essential (primary) hypertension: Secondary | ICD-10-CM | POA: Diagnosis not present

## 2017-02-19 DIAGNOSIS — I480 Paroxysmal atrial fibrillation: Secondary | ICD-10-CM | POA: Diagnosis not present

## 2017-02-19 MED ORDER — EZETIMIBE 10 MG PO TABS: 10.0000 mg | ORAL_TABLET | Freq: Every day | ORAL | 3 refills | Status: DC

## 2017-02-19 MED ORDER — BENAZEPRIL HCL 40 MG PO TABS: ORAL_TABLET | ORAL | 3 refills | Status: DC

## 2017-02-19 MED ORDER — METOPROLOL SUCCINATE ER 25 MG PO TB24: ORAL_TABLET | ORAL | 3 refills | Status: DC

## 2017-02-19 MED ORDER — RIVAROXABAN 20 MG PO TABS: 20.0000 mg | ORAL_TABLET | Freq: Every day | ORAL | 3 refills | Status: DC

## 2017-02-19 MED ORDER — AMLODIPINE BESYLATE 5 MG PO TABS: ORAL_TABLET | ORAL | 3 refills | Status: DC

## 2017-02-19 MED ORDER — ROSUVASTATIN CALCIUM 10 MG PO TABS: 10.0000 mg | ORAL_TABLET | Freq: Every day | ORAL | 3 refills | Status: DC

## 2017-02-19 MED ORDER — AMIODARONE HCL 200 MG PO TABS: ORAL_TABLET | ORAL | 3 refills | Status: DC

## 2017-02-19 MED ORDER — FUROSEMIDE 20 MG PO TABS: 20.0000 mg | ORAL_TABLET | Freq: Every day | ORAL | 3 refills | Status: DC | PRN

## 2017-02-19 NOTE — Addendum Note (Signed)
Addended by: Valora Corporal on: 02/19/2017 08:34 AM   Modules accepted: Orders

## 2017-02-19 NOTE — Patient Instructions (Addendum)
Medication Instructions:   Please start zetia one a day for cholesterol  Labwork:  No new labs needed  Testing/Procedures:  No further testing at this time   Follow-Up: It was a pleasure seeing you in the office today. Please call us if you have new issues that need to be addressed before your next appt.  [phone]  Your physician wants you to follow-up in: 12 months.  You will receive a reminder letter in the mail two months in advance. If you don't receive a letter, please call our office to schedule the follow-up appointment.  If you need a refill on your cardiac medications before your next appointment, please call your pharmacy.

## 2017-03-13 ENCOUNTER — Observation Stay
Admission: EM | Admit: 2017-03-13 | Discharge: 2017-03-14 | Disposition: A | Discharge status: Home / Self Care | Payer: 59 | Attending: Specialist | Admitting: Specialist

## 2017-03-13 ENCOUNTER — Encounter: Payer: Self-pay | Admitting: Intensive Care

## 2017-03-13 ENCOUNTER — Emergency Department: Payer: 59

## 2017-03-13 ENCOUNTER — Other Ambulatory Visit: Payer: Self-pay

## 2017-03-13 DIAGNOSIS — Z6841 Body Mass Index (BMI) 40.0 and over, adult: Secondary | ICD-10-CM | POA: Diagnosis not present

## 2017-03-13 DIAGNOSIS — I209 Angina pectoris, unspecified: Principal | ICD-10-CM | POA: Diagnosis present

## 2017-03-13 DIAGNOSIS — R911 Solitary pulmonary nodule: Secondary | ICD-10-CM | POA: Diagnosis not present

## 2017-03-13 DIAGNOSIS — R079 Chest pain, unspecified: Secondary | ICD-10-CM

## 2017-03-13 DIAGNOSIS — Z7901 Long term (current) use of anticoagulants: Secondary | ICD-10-CM | POA: Diagnosis not present

## 2017-03-13 DIAGNOSIS — M5117 Intervertebral disc disorders with radiculopathy, lumbosacral region: Secondary | ICD-10-CM | POA: Diagnosis not present

## 2017-03-13 DIAGNOSIS — R0789 Other chest pain: Secondary | ICD-10-CM | POA: Diagnosis not present

## 2017-03-13 DIAGNOSIS — Z87891 Personal history of nicotine dependence: Secondary | ICD-10-CM | POA: Diagnosis not present

## 2017-03-13 DIAGNOSIS — M546 Pain in thoracic spine: Secondary | ICD-10-CM | POA: Diagnosis not present

## 2017-03-13 DIAGNOSIS — D510 Vitamin B12 deficiency anemia due to intrinsic factor deficiency: Secondary | ICD-10-CM | POA: Insufficient documentation

## 2017-03-13 DIAGNOSIS — I4819 Other persistent atrial fibrillation: Secondary | ICD-10-CM | POA: Diagnosis present

## 2017-03-13 DIAGNOSIS — I10 Essential (primary) hypertension: Secondary | ICD-10-CM | POA: Diagnosis present

## 2017-03-13 DIAGNOSIS — E782 Mixed hyperlipidemia: Secondary | ICD-10-CM | POA: Diagnosis present

## 2017-03-13 DIAGNOSIS — I440 Atrioventricular block, first degree: Secondary | ICD-10-CM | POA: Insufficient documentation

## 2017-03-13 DIAGNOSIS — I480 Paroxysmal atrial fibrillation: Secondary | ICD-10-CM | POA: Diagnosis not present

## 2017-03-13 DIAGNOSIS — G629 Polyneuropathy, unspecified: Secondary | ICD-10-CM | POA: Insufficient documentation

## 2017-03-13 DIAGNOSIS — Z79899 Other long term (current) drug therapy: Secondary | ICD-10-CM | POA: Insufficient documentation

## 2017-03-13 DIAGNOSIS — R0602 Shortness of breath: Secondary | ICD-10-CM | POA: Diagnosis not present

## 2017-03-13 DIAGNOSIS — E785 Hyperlipidemia, unspecified: Secondary | ICD-10-CM | POA: Diagnosis not present

## 2017-03-13 DIAGNOSIS — K8020 Calculus of gallbladder without cholecystitis without obstruction: Secondary | ICD-10-CM | POA: Diagnosis not present

## 2017-03-13 DIAGNOSIS — I429 Cardiomyopathy, unspecified: Secondary | ICD-10-CM | POA: Insufficient documentation

## 2017-03-13 HISTORY — DX: Paroxysmal atrial fibrillation: I48.0

## 2017-03-13 HISTORY — DX: Cardiomyopathy, unspecified: I42.9

## 2017-03-13 HISTORY — DX: Solitary pulmonary nodule: R91.1

## 2017-03-13 LAB — BASIC METABOLIC PANEL
Anion gap: 7 (ref 5–15)
BUN: 14 mg/dL (ref 6–20)
CALCIUM: 8.7 mg/dL — AB (ref 8.9–10.3)
CO2: 22 mmol/L (ref 22–32)
CREATININE: 0.86 mg/dL (ref 0.61–1.24)
Chloride: 111 mmol/L (ref 101–111)
GFR calc non Af Amer: >60 mL/min (ref >60)
Glucose, Bld: 77 mg/dL (ref 65–99)
Potassium: 4.5 mmol/L (ref 3.5–5.1)
SODIUM: 140 mmol/L (ref 135–145)

## 2017-03-13 LAB — HEPATIC FUNCTION PANEL
ALBUMIN: 4.4 g/dL (ref 3.5–5.0)
ALT: 20 U/L (ref 17–63)
AST: 25 U/L (ref 15–41)
Alkaline Phosphatase: 58 U/L (ref 38–126)
BILIRUBIN DIRECT: 0.1 mg/dL (ref 0.1–0.5)
Indirect Bilirubin: 0.4 mg/dL (ref 0.3–0.9)
Total Bilirubin: 0.5 mg/dL (ref 0.3–1.2)
Total Protein: 6.9 g/dL (ref 6.5–8.1)

## 2017-03-13 LAB — CBC
HCT: 43.9 % (ref 40.0–52.0)
Hemoglobin: 14.6 g/dL (ref 13.0–18.0)
MCH: 29 pg (ref 26.0–34.0)
MCHC: 33.1 g/dL (ref 32.0–36.0)
MCV: 87.6 fL (ref 80.0–100.0)
PLATELETS: 196 10*3/uL (ref 150–440)
RBC: 5.01 MIL/uL (ref 4.40–5.90)
RDW: 14.2 % (ref 11.5–14.5)
WBC: 8.5 10*3/uL (ref 3.8–10.6)

## 2017-03-13 LAB — TROPONIN I: Troponin I: <0.03 ng/mL (ref <0.03)

## 2017-03-13 LAB — LIPASE, BLOOD: LIPASE: 28 U/L (ref 11–51)

## 2017-03-13 MED ORDER — MORPHINE SULFATE (PF) 4 MG/ML IV SOLN
4.0000 mg | Freq: Once | INTRAVENOUS | Status: AC
Start: 2017-03-13 — End: 2017-03-13
  Administered 2017-03-13: 4 mg via INTRAVENOUS
  Filled 2017-03-13: qty 1

## 2017-03-13 MED ORDER — ASPIRIN 81 MG PO CHEW
324.0000 mg | CHEWABLE_TABLET | Freq: Once | ORAL | Status: AC
  Administered 2017-03-13: 324 mg via ORAL
  Filled 2017-03-13: qty 4

## 2017-03-13 MED ORDER — ONDANSETRON HCL 4 MG/2ML IJ SOLN
4.0000 mg | Freq: Once | INTRAMUSCULAR | Status: AC
  Administered 2017-03-13: 4 mg via INTRAVENOUS
  Filled 2017-03-13: qty 2

## 2017-03-13 MED ORDER — NITROGLYCERIN 0.4 MG SL SUBL
SUBLINGUAL_TABLET | SUBLINGUAL | Status: AC
  Filled 2017-03-13: qty 1

## 2017-03-13 MED ORDER — SODIUM CHLORIDE 0.9 % IV BOLUS (SEPSIS)
500.0000 mL | Freq: Once | INTRAVENOUS | Status: AC
  Administered 2017-03-13: 500 mL via INTRAVENOUS

## 2017-03-13 MED ORDER — IOPAMIDOL (ISOVUE-370) INJECTION 76%
125.0000 mL | Freq: Once | INTRAVENOUS | Status: AC | PRN
  Administered 2017-03-13: 125 mL via INTRAVENOUS

## 2017-03-13 MED ORDER — NITROGLYCERIN 0.4 MG SL SUBL
0.4000 mg | SUBLINGUAL_TABLET | SUBLINGUAL | Status: DC | PRN
  Administered 2017-03-13: 0.4 mg via SUBLINGUAL

## 2017-03-13 NOTE — ED Notes (Signed)
Pt presents today for chest pain. Pt states it started earlier today and radiates down left arm and into back. Pt states it feels like a "tear" or "pulling" in the back. EDP at bedside.

## 2017-03-13 NOTE — H&P (Signed)
[place] [facility] [place] NAME: [name]    MR#:  [id number]  DATE OF BIRTH:  1955/05/11  DATE OF ADMISSION:  03/13/2017  PRIMARY CARE PHYSICIAN: Guadalupe Maple, MD   REQUESTING/REFERRING PHYSICIAN: Alfred Levins, MD  CHIEF COMPLAINT:   Chief Complaint  Patient presents with  . Chest Pain    HISTORY OF PRESENT ILLNESS:  [name]  is a 62 y.o. male who presents with angina pectoris.  Patient states that earlier in the day he began having back pain which radiated through to his chest.  It became worse throughout the day.  Later he had radiation to his left arm and hand, and then tingling in his left jaw.  He came to the ED for evaluation.  Here his pain started to resolve and then improved after analgesic administration.  His first 2 troponins were normal.  Given his risk factors and typical nature of his chest pain, hospitalist were called for admission  PAST MEDICAL HISTORY:   Past Medical History:  Diagnosis Date  . DJD (degenerative joint disease) of knee   . Dysrhythmia   . History of kidney stones   . Intervertebral disc disorder with radiculopathy of lumbosacral region   . Intervertebral disc disorder with radiculopathy of lumbosacral region   . Kidney stone 2012  . Lower extremity edema   . New onset a-fib ([place])   . Pernicious anemia   . Prostate cancer ([place])   . SVT (supraventricular tachycardia) ([place])     PAST SURGICAL HISTORY:   Past Surgical History:  Procedure Laterality Date  . CARPAL TUNNEL RELEASE Right 12/23/2014   Procedure: CARPAL TUNNEL RELEASE;  Surgeon: Earnestine Leys, MD;  Location: [facility] ORS;  Service: Orthopedics;  Laterality: Right;  . CARPAL TUNNEL RELEASE Left 01/06/2015   Procedure: CARPAL TUNNEL RELEASE;  Surgeon: Earnestine Leys, MD;  Location: [facility] ORS;  Service: Orthopedics;  Laterality: Left;  Marland Kitchen GASTRIC BYPASS  2002  . KNEE SURGERY     knee trauma  . prostate seeding      SOCIAL HISTORY:    Social History   Tobacco Use  . Smoking status: Former Smoker    Packs/day: 1.00    Years: 10.00    Pack years: 10.00    Types: Cigarettes    Last attempt to quit: 02/18/1979    Years since quitting: 38.0  . Smokeless tobacco: Former Systems developer    Types: Snuff  Substance Use Topics  . Alcohol use: Yes    Alcohol/week: 7.2 oz    Types: 12 Cans of beer per week    FAMILY HISTORY:   Family History  Problem Relation Age of Onset  . Heart disease Father   . Hypertension Father   . Stroke Father   . Cancer Father   . Arrhythmia Sister        A-fib  . Arrhythmia Brother        A-fib    DRUG ALLERGIES:  No Known Allergies  MEDICATIONS AT HOME:   Prior to Admission medications   Medication Sig Start Date End Date Taking? Authorizing Provider  amiodarone (PACERONE) 200 MG tablet TAKE 1 TABLET(200 MG) BY MOUTH DAILY 02/19/17  Yes Gollan, Kathlene November, MD  amLODipine (NORVASC) 5 MG tablet TAKE 1 TABLET(5 MG) BY MOUTH DAILY 02/19/17  Yes Gollan, Kathlene November, MD  benazepril (LOTENSIN) 40 MG tablet TAKE 1 TABLET(40 MG) BY MOUTH DAILY 02/19/17  Yes Gollan, Kathlene November, MD  cyanocobalamin (,VITAMIN  B-12,) 1000 MCG/ML injection Inject 1 mL (1,000 mcg total) into the muscle every 30 (thirty) days. 05/22/16  Yes Crissman, Jeannette How, MD  ezetimibe (ZETIA) 10 MG tablet Take 1 tablet (10 mg total) by mouth daily. Patient taking differently: Take 10 mg by mouth at bedtime.  02/19/17  Yes Gollan, Kathlene November, MD  Ferrous Gluconate (IRON 27 PO) Take 1 tablet by mouth at bedtime.   Yes [provider]  furosemide (LASIX) 20 MG tablet Take 1 tablet (20 mg total) by mouth daily as needed. 02/19/17  Yes Gollan, Kathlene November, MD  gabapentin (NEURONTIN) 400 MG capsule Take 1 capsule (400 mg total) by mouth 3 (three) times daily. 12/23/14  Yes Earnestine Leys, MD  metoprolol succinate (TOPROL-XL) 25 MG 24 hr tablet TAKE 1 TABLET(25 MG) BY MOUTH DAILY Patient taking differently: Take 25 mg by mouth every evening.  02/19/17   Yes Gollan, Kathlene November, MD  rivaroxaban (XARELTO) 20 MG TABS tablet Take 1 tablet (20 mg total) by mouth daily. Patient taking differently: Take 20 mg by mouth every morning.  02/19/17  Yes Minna Merritts, MD  rosuvastatin (CRESTOR) 10 MG tablet Take 1 tablet (10 mg total) by mouth at bedtime. 02/19/17  Yes Gollan, Kathlene November, MD  traMADol (ULTRAM) 50 MG tablet Take 1 tablet (50 mg total) every 6 (six) hours as needed by mouth. Patient taking differently: Take 50-100 mg by mouth 2 (two) times daily. 100mg  in the morning and 50mg  in the evening 12/04/16  Yes Crissman, Jeannette How, MD  traZODone (DESYREL) 50 MG tablet Take 1 tablet (50 mg total) by mouth at bedtime as needed for sleep. 05/22/16  Yes Crissman, Jeannette How, MD    REVIEW OF SYSTEMS:  Review of Systems  Constitutional: Negative for chills, fever, malaise/fatigue and weight loss.  HENT: Negative for ear pain, hearing loss and tinnitus.   Eyes: Negative for blurred vision, double vision, pain and redness.  Respiratory: Positive for shortness of breath. Negative for cough and hemoptysis.   Cardiovascular: Positive for chest pain. Negative for palpitations, orthopnea and leg swelling.  Gastrointestinal: Negative for abdominal pain, constipation, diarrhea, nausea and vomiting.  Genitourinary: Negative for dysuria, frequency and hematuria.  Musculoskeletal: Negative for back pain, joint pain and neck pain.  Skin:       No acne, rash, or lesions  Neurological: Negative for dizziness, tremors, focal weakness and weakness.  Endo/Heme/Allergies: Negative for polydipsia. Does not bruise/bleed easily.  Psychiatric/Behavioral: Negative for depression. The patient is not nervous/anxious and does not have insomnia.      VITAL SIGNS:   Vitals:   03/13/17 1551 03/13/17 1900 03/13/17 1907 03/13/17 2035  BP: 136/62 134/84  123/76  Pulse: 73 (!) 56  (!) 58  Resp: 18 16  15   Temp: 98.4 F (36.9 C)     TempSrc: Oral     SpO2: 94% 96% 99% 98%  Weight:       Height:       Wt Readings from Last 3 Encounters:  03/13/17 (!) 145.2 kg (320 lb)  02/19/17 (!) 150.5 kg (331 lb 12 oz)  12/04/16 (!) 143.8 kg (317 lb)    PHYSICAL EXAMINATION:  Physical Exam  Vitals reviewed. Constitutional: He is oriented to person, place, and time. He appears well-developed and well-nourished. No distress.  HENT:  Head: Normocephalic and atraumatic.  Mouth/Throat: Oropharynx is clear and moist.  Eyes: Conjunctivae and EOM are normal. Pupils are equal, round, and reactive to light. No scleral icterus.  Neck: Normal range of motion. Neck supple. No JVD present. No thyromegaly present.  Cardiovascular: Normal rate, regular rhythm and intact distal pulses. Exam reveals no gallop and no friction rub.  No murmur heard. Respiratory: Effort normal and breath sounds normal. No respiratory distress. He has no wheezes. He has no rales.  GI: Soft. Bowel sounds are normal. He exhibits no distension. There is no tenderness.  Musculoskeletal: Normal range of motion. He exhibits no edema.  No arthritis, no gout  Lymphadenopathy:    He has no cervical adenopathy.  Neurological: He is alert and oriented to person, place, and time. No cranial nerve deficit.  No dysarthria, no aphasia  Skin: Skin is warm and dry. No rash noted. No erythema.  Psychiatric: He has a normal mood and affect. His behavior is normal. Judgment and thought content normal.    LABORATORY PANEL:   CBC Recent Labs  Lab 03/13/17 1539  WBC 8.5  HGB 14.6  HCT 43.9  PLT 196   ------------------------------------------------------------------------------------------------------------------  Chemistries  Recent Labs  Lab 03/13/17 1539  NA 140  K 4.5  CL 111  CO2 22  GLUCOSE 77  BUN 14  CREATININE 0.86  CALCIUM 8.7*  AST 25  ALT 20  ALKPHOS 58  BILITOT 0.5   ------------------------------------------------------------------------------------------------------------------  Cardiac  Enzymes Recent Labs  Lab 03/13/17 1958  TROPONINI <0.03   ------------------------------------------------------------------------------------------------------------------  RADIOLOGY:  Dg Chest 2 View  Result Date: 03/13/2017 CLINICAL DATA:  Chest pain. EXAM: CHEST  2 VIEW COMPARISON:  CT chest dated April 28, 2015. FINDINGS: The heart size and mediastinal contours are within normal limits. Normal pulmonary vascularity. No focal consolidation, pleural effusion, or pneumothorax. Nodular density projecting over the right upper lobe corresponds to a prominent osteophyte at the right first costochondral junction. No acute osseous abnormality. IMPRESSION: No active cardiopulmonary disease. Electronically Signed   By: Titus Dubin M.D.   On: 03/13/2017 16:23   Ct Angio Chest/abd/pel For Dissection W And/or Wo Contrast  Result Date: 03/13/2017 CLINICAL DATA:  Central chest pain radiating to the back and left arm. EXAM: CT ANGIOGRAPHY CHEST, ABDOMEN AND PELVIS TECHNIQUE: Multidetector CT imaging through the chest, abdomen and pelvis was performed using the standard protocol during bolus administration of intravenous contrast. Multiplanar reconstructed images and MIPs were obtained and reviewed to evaluate the vascular anatomy. CONTRAST:  153mL ISOVUE-370 IOPAMIDOL (ISOVUE-370) INJECTION 76% COMPARISON:  CT chest dated April 27, 2005. FINDINGS: CTA CHEST FINDINGS Cardiovascular: Preferential opacification of the thoracic aorta. No evidence of thoracic aortic aneurysm or dissection. Normal heart size. No pericardial effusion. Mediastinum/Nodes: No enlarged mediastinal, hilar, or axillary lymph nodes. Thyroid gland, trachea, and esophagus demonstrate no significant findings. Lungs/Pleura: Lungs are clear. No pleural effusion or pneumothorax. Stable 7 mm pulmonary nodule in the right lower lobe, benign. Musculoskeletal: No chest wall abnormality. No acute or significant osseous findings. Review of the MIP  images confirms the above findings. CTA ABDOMEN AND PELVIS FINDINGS VASCULAR Aorta: Normal caliber aorta without aneurysm, dissection, vasculitis or significant stenosis. Mild atherosclerosis. Celiac: Patent without evidence of aneurysm, dissection, vasculitis or significant stenosis. SMA: Patent without evidence of aneurysm, dissection, vasculitis or significant stenosis. Renals: Two left and single right renal arteries are patent without evidence of aneurysm, dissection, vasculitis, fibromuscular dysplasia or significant stenosis. IMA: Patent without evidence of aneurysm, dissection, vasculitis or significant stenosis. Inflow: Patent without evidence of aneurysm, dissection, vasculitis or significant stenosis. Veins: No obvious venous abnormality within the limitations of this arterial phase study. Review of the  MIP images confirms the above findings. NON-VASCULAR Hepatobiliary: No focal liver abnormality. Cholelithiasis. No gallbladder wall thickening or biliary dilatation. Pancreas: Unremarkable. No pancreatic ductal dilatation or surrounding inflammatory changes. Spleen: Normal in size without focal abnormality. Adrenals/Urinary Tract: Adrenal glands are unremarkable. Kidneys are normal, without renal calculi, focal lesion, or hydronephrosis. Bladder is unremarkable. Stomach/Bowel: Prior Roux-en-Y gastric bypass surgery. No evidence of bowel wall thickening, distention, or surrounding inflammatory changes. Normal appendix. Lymphatic: No abdominal or pelvic lymphadenopathy. Reproductive: Brachytherapy seeds within the prostate. Other: No free fluid or pneumoperitoneum. Tiny fat containing umbilical hernia. Musculoskeletal: No acute or significant osseous findings. Bilateral L5 pars defects with trace L5-S1 anterolisthesis. Probable bone island in the left iliac bone. Review of the MIP images confirms the above findings. IMPRESSION: Vascular: 1. No evidence of acute aortic syndrome.  No aortic aneurysm. 2.   Aortic atherosclerosis (ICD10-I70.0). Chest: 1.  No acute intrathoracic process. Abdomen and pelvis: 1.  No acute intra-abdominal process. 2. Cholelithiasis. Electronically Signed   By: Titus Dubin M.D.   On: 03/13/2017 19:56    EKG:   Orders placed or performed during the hospital encounter of 03/13/17  . EKG 12-Lead  . EKG 12-Lead  . ED EKG within 10 minutes  . ED EKG within 10 minutes    IMPRESSION AND PLAN:  Principal Problem:   Angina pectoris ([place]) -cycle cardiac enzymes for total of 3 sets if they remain within normal limits, get an echocardiogram and cardiology consult Active Problems:   Essential hypertension -continue home meds   Paroxysmal atrial fibrillation ([place]) -continue home rate controlling medications and anticoagulation   Hyperlipidemia -continue home dose anti-lipid  All the records are reviewed and case discussed with ED provider. Management plans discussed with the patient and/or family.  DVT PROPHYLAXIS: SubQ lovenox  GI PROPHYLAXIS: None  ADMISSION STATUS: Observation  CODE STATUS: Full Code Status History    This patient does not have a recorded code status. Please follow your organizational policy for patients in this situation.      TOTAL TIME TAKING CARE OF THIS PATIENT: 40 minutes.   Jalayia Bagheri Evans 03/13/2017, 10:06 PM  [facility]  Office  [phone]  CC: Primary care physician; Guadalupe Maple, MD  Note:  This document was prepared using Dragon voice recognition software and may include unintentional dictation errors.

## 2017-03-13 NOTE — ED Triage Notes (Signed)
Patient c/o achy central chest pain that radiates to back and left arm that started around 11am today. Pt also c/o SOB. A&O x4. Able to talk in complete sentences with no respiratory distress

## 2017-03-13 NOTE — ED Provider Notes (Signed)
[facility] Emergency Department Provider Note  ____________________________________________  Time seen: Approximately 6:58 PM  I have reviewed the triage vital signs and the nursing notes.   HISTORY  Chief Complaint Chest Pain   HPI [name] is a 62 y.o. male with a history of paroxysmal atrial fibrillation on Xarelto, remote prostate cancer, pernicious anemia, gastric bypass, hypertension, hyperlipidemia who presents for evaluation of chest pain. Patient reports that he was walking at work when he developed sudden onset of shortness of breath associated with a severe pressure between his shoulder blades radiating to the center of his chest and down his left arm. Felt tingling sensation in the left side of his face. He sat down in his office with no improvement of his symptoms. He then went home and took 2 tramadol and reports that the pain improved slightly but still complaining of 8 out of 10 pain mostly now below his left shoulder blade. The pain is not pleuritic in nature. He endorses compliance with his anticoagulation. He denies any personal history of blood clots. His mother has had a blood clot in the setting of gastric bypass. Patient denies history of heart disease. He has remote history of smoking. Patient reports that he drinks alcohol 3 times a week but can drink up to 12 pack when he does drink. Denies any vomiting. Does report feeling dizzy as well. No abdominal pain, no fever, no URI symptoms.  Past Medical History:  Diagnosis Date  . DJD (degenerative joint disease) of knee   . Dysrhythmia   . History of kidney stones   . Intervertebral disc disorder with radiculopathy of lumbosacral region   . Intervertebral disc disorder with radiculopathy of lumbosacral region   . Kidney stone 2012  . Lower extremity edema   . New onset a-fib ([place])   . Pernicious anemia   . Prostate cancer ([place])   . SVT (supraventricular tachycardia) [facility])      Patient Active Problem List   Diagnosis Date Noted  . Pernicious anemia 05/22/2016  . Paroxysmal atrial fibrillation ([place]) 12/20/2015  . DJD (degenerative joint disease) of knee 10/27/2014  . Carpal tunnel syndrome, bilateral 10/27/2014  . Morbid obesity ([place]) 10/27/2014  . Hyperlipidemia 10/27/2014  . H/O gastric bypass 03/20/2014  . Hyperkalemia 03/20/2014  . Prostate cancer ([place]) 12/26/2013  . Essential hypertension 12/26/2013  . Encounter for anticoagulation discussion and counseling 12/26/2013    Past Surgical History:  Procedure Laterality Date  . CARPAL TUNNEL RELEASE Right 12/23/2014   Procedure: CARPAL TUNNEL RELEASE;  Surgeon: Earnestine Leys, MD;  Location: [facility] ORS;  Service: Orthopedics;  Laterality: Right;  . CARPAL TUNNEL RELEASE Left 01/06/2015   Procedure: CARPAL TUNNEL RELEASE;  Surgeon: Earnestine Leys, MD;  Location: [facility] ORS;  Service: Orthopedics;  Laterality: Left;  Marland Kitchen GASTRIC BYPASS  2002  . KNEE SURGERY     knee trauma  . prostate seeding      Prior to Admission medications   Medication Sig Start Date End Date Taking? Authorizing Provider  amiodarone (PACERONE) 200 MG tablet TAKE 1 TABLET(200 MG) BY MOUTH DAILY 02/19/17   Minna Merritts, MD  amLODipine (NORVASC) 5 MG tablet TAKE 1 TABLET(5 MG) BY MOUTH DAILY 02/19/17   Minna Merritts, MD  benazepril (LOTENSIN) 40 MG tablet TAKE 1 TABLET(40 MG) BY MOUTH DAILY 02/19/17   Minna Merritts, MD  cyanocobalamin (,VITAMIN B-12,) 1000 MCG/ML injection Inject 1 mL (1,000 mcg total) into the muscle every 30 (thirty) days.  05/22/16   Guadalupe Maple, MD  diphenhydramine-acetaminophen (TYLENOL PM) 25-500 MG TABS tablet Take 1 tablet by mouth 2 (two) times daily.    [provider]  ezetimibe (ZETIA) 10 MG tablet Take 1 tablet (10 mg total) by mouth daily. 02/19/17   Minna Merritts, MD  furosemide (LASIX) 20 MG tablet Take 1 tablet (20 mg total) by mouth daily as needed. 02/19/17   Minna Merritts, MD   gabapentin (NEURONTIN) 400 MG capsule Take 1 capsule (400 mg total) by mouth 3 (three) times daily. 12/23/14   Earnestine Leys, MD  metoprolol succinate (TOPROL-XL) 25 MG 24 hr tablet TAKE 1 TABLET(25 MG) BY MOUTH DAILY 02/19/17   Minna Merritts, MD  rivaroxaban (XARELTO) 20 MG TABS tablet Take 1 tablet (20 mg total) by mouth daily. 02/19/17   Minna Merritts, MD  rosuvastatin (CRESTOR) 10 MG tablet Take 1 tablet (10 mg total) by mouth at bedtime. 02/19/17   Minna Merritts, MD  traMADol (ULTRAM) 50 MG tablet Take 1 tablet (50 mg total) every 6 (six) hours as needed by mouth. 12/04/16   Crissman, Jeannette How, MD  traZODone (DESYREL) 50 MG tablet Take 1 tablet (50 mg total) by mouth at bedtime as needed for sleep. Patient taking differently: Take 1 tablet (50 mg total) by mouth at bedtime as needed for sleep. 05/22/16   Guadalupe Maple, MD    Allergies Patient has no known allergies.  Family History  Problem Relation Age of Onset  . Heart disease Father   . Hypertension Father   . Stroke Father   . Cancer Father   . Arrhythmia Sister        A-fib  . Arrhythmia Brother        A-fib    Social History Social History   Tobacco Use  . Smoking status: Former Smoker    Packs/day: 1.00    Years: 10.00    Pack years: 10.00    Types: Cigarettes    Last attempt to quit: 02/18/1979    Years since quitting: 38.0  . Smokeless tobacco: Former Systems developer    Types: Snuff  Substance Use Topics  . Alcohol use: Yes    Alcohol/week: 7.2 oz    Types: 12 Cans of beer per week  . Drug use: No    Review of Systems  Constitutional: Negative for fever. + Lightheadedness Eyes: Negative for visual changes. ENT: Negative for sore throat. Neck: No neck pain  Cardiovascular: + chest pain. Respiratory: + shortness of breath. Gastrointestinal: Negative for abdominal pain, vomiting or diarrhea. Genitourinary: Negative for dysuria. Musculoskeletal: + upper back pain. Skin: Negative for rash. Neurological:  Negative for headaches, weakness or numbness. Psych: No SI or HI  ____________________________________________   PHYSICAL EXAM:  VITAL SIGNS: ED Triage Vitals  Enc Vitals Group     BP 03/13/17 1551 136/62     Pulse Rate 03/13/17 1551 73     Resp 03/13/17 1551 18     Temp 03/13/17 1551 98.4 F (36.9 C)     Temp Source 03/13/17 1551 Oral     SpO2 03/13/17 1551 94 %     Weight 03/13/17 1549 (!) 320 lb (145.2 kg)     Height 03/13/17 1549 6' (1.829 m)     Head Circumference --      Peak Flow --      Pain Score 03/13/17 1549 9     Pain Loc --      Pain Edu? --  Excl. in [place]? --     Constitutional: Alert and oriented. Well appearing and in no apparent distress. HEENT:      Head: Normocephalic and atraumatic.         Eyes: Conjunctivae are normal. Sclera is non-icteric.       Mouth/Throat: Mucous membranes are moist.       Neck: Supple with no signs of meningismus. Cardiovascular: Regular rate and rhythm. No murmurs, gallops, or rubs. 2+ symmetrical distal pulses are present in all extremities. No JVD. Respiratory: Normal respiratory effort. Lungs are clear to auscultation bilaterally. No wheezes, crackles, or rhonchi.  Gastrointestinal: Soft, non tender, and non distended with positive bowel sounds. No rebound or guarding. Musculoskeletal: 1+ pitting edema in bilateral lower extremities  Neurologic: Normal speech and language. Face is symmetric. Moving all extremities. No gross focal neurologic deficits are appreciated. Skin: Skin is warm, dry and intact. No rash noted. Psychiatric: Mood and affect are normal. Speech and behavior are normal.  ____________________________________________   LABS (all labs ordered are listed, but only abnormal results are displayed)  Labs Reviewed  BASIC METABOLIC PANEL - Abnormal; Notable for the following components:      Result Value   Calcium 8.7 (*)    All other components within normal limits  CBC  TROPONIN I  HEPATIC FUNCTION  PANEL  LIPASE, BLOOD  TROPONIN I   ____________________________________________  EKG  ED ECG REPORT I, Rudene Re, the attending physician, personally viewed and interpreted this ECG.   15:34 - EKG with interference limiting read, NSR, 1st degree AV block, no STE or depression  18:58 - sinus bradycardia, rate of 58, normal intervals, normal axis, no ST elevations or depressions. Unchanged from prior from 20 days ago ____________________________________________  RADIOLOGY  I have personally reviewed the images performed during this visit and I agree with the Radiologist's read.   Interpretation by Radiologist:  Dg Chest 2 View  Result Date: 03/13/2017 CLINICAL DATA:  Chest pain. EXAM: CHEST  2 VIEW COMPARISON:  CT chest dated April 28, 2015. FINDINGS: The heart size and mediastinal contours are within normal limits. Normal pulmonary vascularity. No focal consolidation, pleural effusion, or pneumothorax. Nodular density projecting over the right upper lobe corresponds to a prominent osteophyte at the right first costochondral junction. No acute osseous abnormality. IMPRESSION: No active cardiopulmonary disease. Electronically Signed   By: Titus Dubin M.D.   On: 03/13/2017 16:23   Ct Angio Chest/abd/pel For Dissection W And/or Wo Contrast  Result Date: 03/13/2017 CLINICAL DATA:  Central chest pain radiating to the back and left arm. EXAM: CT ANGIOGRAPHY CHEST, ABDOMEN AND PELVIS TECHNIQUE: Multidetector CT imaging through the chest, abdomen and pelvis was performed using the standard protocol during bolus administration of intravenous contrast. Multiplanar reconstructed images and MIPs were obtained and reviewed to evaluate the vascular anatomy. CONTRAST:  111mL ISOVUE-370 IOPAMIDOL (ISOVUE-370) INJECTION 76% COMPARISON:  CT chest dated April 27, 2005. FINDINGS: CTA CHEST FINDINGS Cardiovascular: Preferential opacification of the thoracic aorta. No evidence of thoracic aortic  aneurysm or dissection. Normal heart size. No pericardial effusion. Mediastinum/Nodes: No enlarged mediastinal, hilar, or axillary lymph nodes. Thyroid gland, trachea, and esophagus demonstrate no significant findings. Lungs/Pleura: Lungs are clear. No pleural effusion or pneumothorax. Stable 7 mm pulmonary nodule in the right lower lobe, benign. Musculoskeletal: No chest wall abnormality. No acute or significant osseous findings. Review of the MIP images confirms the above findings. CTA ABDOMEN AND PELVIS FINDINGS VASCULAR Aorta: Normal caliber aorta without aneurysm, dissection, vasculitis or  significant stenosis. Mild atherosclerosis. Celiac: Patent without evidence of aneurysm, dissection, vasculitis or significant stenosis. SMA: Patent without evidence of aneurysm, dissection, vasculitis or significant stenosis. Renals: Two left and single right renal arteries are patent without evidence of aneurysm, dissection, vasculitis, fibromuscular dysplasia or significant stenosis. IMA: Patent without evidence of aneurysm, dissection, vasculitis or significant stenosis. Inflow: Patent without evidence of aneurysm, dissection, vasculitis or significant stenosis. Veins: No obvious venous abnormality within the limitations of this arterial phase study. Review of the MIP images confirms the above findings. NON-VASCULAR Hepatobiliary: No focal liver abnormality. Cholelithiasis. No gallbladder wall thickening or biliary dilatation. Pancreas: Unremarkable. No pancreatic ductal dilatation or surrounding inflammatory changes. Spleen: Normal in size without focal abnormality. Adrenals/Urinary Tract: Adrenal glands are unremarkable. Kidneys are normal, without renal calculi, focal lesion, or hydronephrosis. Bladder is unremarkable. Stomach/Bowel: Prior Roux-en-Y gastric bypass surgery. No evidence of bowel wall thickening, distention, or surrounding inflammatory changes. Normal appendix. Lymphatic: No abdominal or pelvic  lymphadenopathy. Reproductive: Brachytherapy seeds within the prostate. Other: No free fluid or pneumoperitoneum. Tiny fat containing umbilical hernia. Musculoskeletal: No acute or significant osseous findings. Bilateral L5 pars defects with trace L5-S1 anterolisthesis. Probable bone island in the left iliac bone. Review of the MIP images confirms the above findings. IMPRESSION: Vascular: 1. No evidence of acute aortic syndrome.  No aortic aneurysm. 2.  Aortic atherosclerosis (ICD10-I70.0). Chest: 1.  No acute intrathoracic process. Abdomen and pelvis: 1.  No acute intra-abdominal process. 2. Cholelithiasis. Electronically Signed   By: Titus Dubin M.D.   On: 03/13/2017 19:56     ____________________________________________   PROCEDURES  Procedure(s) performed: None Procedures Critical Care performed:  None ____________________________________________   INITIAL IMPRESSION / ASSESSMENT AND PLAN / ED COURSE  62 y.o. male with a history of paroxysmal atrial fibrillation on Xarelto, remote prostate cancer, pernicious anemia, gastric bypass, hypertension, hyperlipidemia who presents for evaluation of sudden onset of severe upper back pressure radiating to the chest, left arm, associated with tingling of the face, shortness of breath, and lightheadedness. Patient continues to have constant pain for 8 hours. Initial EKG has significant interference therefore limiting read. A repeat EKG was done which shows no evidence of ischemia. His first troponin is negative. Patient is neurologically intact with strong pulses in all 4 extremities. CTA has been ordered to rule out dissection. Less likely PE since patient is on Xarelto. Will give zofran and morphine for symptoms relief.   Clinical Course as of Mar 13 2112  Tue Mar 13, 2017  2012 CT negative for dissection or PE. First troponin is negative. Patient reports that the pain is now 4 out of 10 after morphine. We'll give nitroglycerin. Second troponin is  pending.  [CV]    Clinical Course User Index [CV] Alfred Levins, [place], MD   _________________________ 9:14 PM on 03/13/2017 -----------------------------------------  Pain fully resolved after 1 sublingual nitroglycerin. Second troponin is negative however due to concerning history and not an alternative diagnosis based on labs and imaging I recommended admission for evaluation of his chest pain. I will discuss with the hospitalist.  As part of my medical decision making, I reviewed the following data within the [place] notes reviewed and incorporated, Labs reviewed , EKG interpreted , Radiograph reviewed , Discussed with admitting physician , Notes from prior ED visits and [place] Controlled Substance Database    Pertinent labs & imaging results that were available during my care of the patient were reviewed by me and considered in my medical decision  making (see chart for details).    ____________________________________________   FINAL CLINICAL IMPRESSION(S) / ED DIAGNOSES  Final diagnoses:  Chest pain, unspecified type      NEW MEDICATIONS STARTED DURING THIS VISIT:  ED Discharge Orders    None       Note:  This document was prepared using Dragon voice recognition software and may include unintentional dictation errors.    Rudene Re, MD 03/13/17 2114

## 2017-03-14 ENCOUNTER — Other Ambulatory Visit: Payer: Self-pay

## 2017-03-14 ENCOUNTER — Telehealth: Payer: Self-pay | Admitting: Cardiovascular Disease

## 2017-03-14 DIAGNOSIS — I2510 Atherosclerotic heart disease of native coronary artery without angina pectoris: Secondary | ICD-10-CM | POA: Diagnosis not present

## 2017-03-14 DIAGNOSIS — I480 Paroxysmal atrial fibrillation: Secondary | ICD-10-CM

## 2017-03-14 DIAGNOSIS — I10 Essential (primary) hypertension: Secondary | ICD-10-CM | POA: Diagnosis not present

## 2017-03-14 DIAGNOSIS — I209 Angina pectoris, unspecified: Secondary | ICD-10-CM | POA: Diagnosis not present

## 2017-03-14 DIAGNOSIS — R079 Chest pain, unspecified: Secondary | ICD-10-CM | POA: Diagnosis not present

## 2017-03-14 DIAGNOSIS — E782 Mixed hyperlipidemia: Secondary | ICD-10-CM

## 2017-03-14 LAB — BASIC METABOLIC PANEL
Anion gap: 7 (ref 5–15)
BUN: 14 mg/dL (ref 6–20)
CALCIUM: 8.2 mg/dL — AB (ref 8.9–10.3)
CO2: 24 mmol/L (ref 22–32)
CREATININE: 0.89 mg/dL (ref 0.61–1.24)
Chloride: 111 mmol/L (ref 101–111)
GFR calc Af Amer: >60 mL/min (ref >60)
Glucose, Bld: 89 mg/dL (ref 65–99)
POTASSIUM: 4 mmol/L (ref 3.5–5.1)
SODIUM: 142 mmol/L (ref 135–145)

## 2017-03-14 LAB — CBC
HCT: 41.3 % (ref 40.0–52.0)
Hemoglobin: 13.6 g/dL (ref 13.0–18.0)
MCH: 28.9 pg (ref 26.0–34.0)
MCHC: 32.9 g/dL (ref 32.0–36.0)
MCV: 87.9 fL (ref 80.0–100.0)
PLATELETS: 165 10*3/uL (ref 150–440)
RBC: 4.69 MIL/uL (ref 4.40–5.90)
RDW: 14.2 % (ref 11.5–14.5)
WBC: 6.5 10*3/uL (ref 3.8–10.6)

## 2017-03-14 LAB — TROPONIN I

## 2017-03-14 MED ORDER — AMLODIPINE BESYLATE 5 MG PO TABS
5.0000 mg | ORAL_TABLET | Freq: Every day | ORAL | Status: DC
  Administered 2017-03-14: 5 mg via ORAL
  Filled 2017-03-14: qty 1

## 2017-03-14 MED ORDER — ACETAMINOPHEN 325 MG PO TABS: 650.0000 mg | ORAL_TABLET | Freq: Four times a day (QID) | ORAL | Status: DC | PRN

## 2017-03-14 MED ORDER — ONDANSETRON HCL 4 MG PO TABS: 4.0000 mg | ORAL_TABLET | Freq: Four times a day (QID) | ORAL | Status: DC | PRN

## 2017-03-14 MED ORDER — MORPHINE SULFATE (PF) 2 MG/ML IV SOLN: 2.0000 mg | INTRAVENOUS | Status: DC | PRN

## 2017-03-14 MED ORDER — METOPROLOL SUCCINATE ER 25 MG PO TB24: 25.0000 mg | ORAL_TABLET | Freq: Every evening | ORAL | Status: DC

## 2017-03-14 MED ORDER — ACETAMINOPHEN 650 MG RE SUPP: 650.0000 mg | Freq: Four times a day (QID) | RECTAL | Status: DC | PRN

## 2017-03-14 MED ORDER — EZETIMIBE 10 MG PO TABS
10.0000 mg | ORAL_TABLET | Freq: Every day | ORAL | Status: DC
  Administered 2017-03-14: 10 mg via ORAL
  Filled 2017-03-14: qty 1

## 2017-03-14 MED ORDER — ROSUVASTATIN CALCIUM 10 MG PO TABS
10.0000 mg | ORAL_TABLET | Freq: Every day | ORAL | Status: DC
  Administered 2017-03-14: 10 mg via ORAL
  Filled 2017-03-14: qty 1

## 2017-03-14 MED ORDER — AMIODARONE HCL 200 MG PO TABS
200.0000 mg | ORAL_TABLET | Freq: Every day | ORAL | Status: DC
  Administered 2017-03-14: 200 mg via ORAL
  Filled 2017-03-14: qty 1

## 2017-03-14 MED ORDER — BENAZEPRIL HCL 20 MG PO TABS
40.0000 mg | ORAL_TABLET | Freq: Every day | ORAL | Status: DC
  Administered 2017-03-14: 40 mg via ORAL
  Filled 2017-03-14: qty 2

## 2017-03-14 MED ORDER — TRAZODONE HCL 50 MG PO TABS: 50.0000 mg | ORAL_TABLET | Freq: Every evening | ORAL | Status: DC | PRN

## 2017-03-14 MED ORDER — GABAPENTIN 400 MG PO CAPS
400.0000 mg | ORAL_CAPSULE | Freq: Three times a day (TID) | ORAL | Status: DC
  Administered 2017-03-14: 400 mg via ORAL
  Filled 2017-03-14: qty 1

## 2017-03-14 MED ORDER — OXYCODONE HCL 5 MG PO TABS: 5.0000 mg | ORAL_TABLET | ORAL | Status: DC | PRN

## 2017-03-14 MED ORDER — ONDANSETRON HCL 4 MG/2ML IJ SOLN: 4.0000 mg | Freq: Four times a day (QID) | INTRAMUSCULAR | Status: DC | PRN

## 2017-03-14 MED ORDER — BENAZEPRIL HCL 40 MG PO TABS
40.0000 mg | ORAL_TABLET | Freq: Every day | ORAL | Status: DC
  Filled 2017-03-14: qty 1

## 2017-03-14 MED ORDER — RIVAROXABAN 20 MG PO TABS
20.0000 mg | ORAL_TABLET | Freq: Every morning | ORAL | Status: DC
  Administered 2017-03-14: 20 mg via ORAL
  Filled 2017-03-14: qty 1

## 2017-03-14 NOTE — Telephone Encounter (Signed)
Left detailed message to call back if any questions regarding discharge instructions or medications, with appointment information, and number to call.

## 2017-03-14 NOTE — Telephone Encounter (Signed)
TCM....  Patient is being discharged [facility] for Angina  Needs 1 wk fu       They are scheduled to see Gollan on 3/6 at 840 am

## 2017-03-14 NOTE — ED Notes (Signed)
Patient transported to room 255 by this EDT.

## 2017-03-14 NOTE — Telephone Encounter (Signed)
Patient returning call.

## 2017-03-14 NOTE — Telephone Encounter (Signed)
Patient contacted regarding discharge from [facility] on 03/14/17.  Patient understands to follow up with provider Dr. Rockey Situ on 03/21/17 at 08:40AM at [facility]. Patient understands discharge instructions? Yes Patient understands medications and regiment? Yes Patient understands to bring all medications to this visit? Yes  He had no further questions at this time and confirmed appointment.

## 2017-03-14 NOTE — Progress Notes (Signed)
Patient alert and oriented, vss, no complaints of pain.  Ambulated around nursing station with no shortness of breath or angina.  D/C home with wife.  No questions. F/u with Gollan.    D/C telemetry and PIV.  Escorted out of hospital via wheelchair by volunteers.

## 2017-03-14 NOTE — Progress Notes (Signed)
Patient arrived to 2A Room 255. Patient denies pain and all questions answered. Patient oriented to unit and use of call bell/room phone. Skin assessment completed with Crystal RN and MASD noted to R groin area. A&Ox4, VSS, and NSR on verified tele-box #[id number]. Nursing staff will continue to monitor for any changes in patient status. Earleen Reaper, RN

## 2017-03-14 NOTE — Consult Note (Signed)
Cardiology Consult    Patient ID: CHERYL STABENOW MRN: [id number], DOB/AGE: October 29, 1955   Admit date: 03/13/2017 Date of Consult: 03/14/2017  Primary Physician: Guadalupe Maple, MD Primary Cardiologist: Ida Rogue, MD Requesting Provider: Bobetta Lime, MD  Patient Profile    [name] is a 62 y.o. male with a history of PAF, SVT, obesity, anemia, and prostate cancer, who is being seen today for the evaluation of chest and back pain at the request of Dr. Verdell Carmine.  Past Medical History   Past Medical History:  Diagnosis Date  . Cardiomyopathy (in setting of Afib)    a. 12/2013 Echo: EF 45-50%, mild ant and antsept HK. mild MR. Mod dil LA. nl RV fxn. Rhythm was Afib.  Marland Kitchen DJD (degenerative joint disease) of knee   . History of kidney stones   . Intervertebral disc disorder with radiculopathy of lumbosacral region   . Kidney stone 2012  . Lower extremity edema   . PAF (paroxysmal atrial fibrillation) ([place])   . Pernicious anemia   . Prostate cancer ([place])   . Pulmonary nodule, right    a. 10/2014 Cardiac CTA: 58mm RLL nodule; b. 04/2015 CT Chest: stable 97mm RLL nodule. No new nodules; 02/2017 CTA Chest: stable, benign, 34mm RLL pulm nodule.  . SVT (supraventricular tachycardia) ([place])     Past Surgical History:  Procedure Laterality Date  . CARPAL TUNNEL RELEASE Right 12/23/2014   Procedure: CARPAL TUNNEL RELEASE;  Surgeon: Earnestine Leys, MD;  Location: [facility] ORS;  Service: Orthopedics;  Laterality: Right;  . CARPAL TUNNEL RELEASE Left 01/06/2015   Procedure: CARPAL TUNNEL RELEASE;  Surgeon: Earnestine Leys, MD;  Location: [facility] ORS;  Service: Orthopedics;  Laterality: Left;  Marland Kitchen GASTRIC BYPASS  2002  . KNEE SURGERY     knee trauma  . prostate seeding       Allergies  No Known Allergies  History of Present Illness    62 y/o ? with a h/o SVT, PAF s/p prior DCCV in 02/2014 on chronic amio and xarelto, obesity s/p gastric bypass, HTN, pernicious anemia, OA, DJD, and prostate cancer.  He  has no h/o CAD but has had cardiac CT w/ a Ca2+ score of 224, all in the prox LAD.  He has been medically managed and does not have a prior h/o chest pain.  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  This was followed by chest and back pain that was worse with deep breathing.  Symptoms persisted when he left work and was @ home, thus prompting him to present to the ED.  Here, ECG was non-acute and trop was nl.  Symptoms resolved spontaneously.  CTA of chest/abd/pelvis, was neg for dissection.  Overnight, he awoke around ~ 4am with right sided chest discomfort, which resolved spontaneously. He still has some soreness between his shoulder blades, but overall fells well this AM. Trop remains nl this AM.  Inpatient Medications    . amiodarone  200 mg Oral Daily  . amLODipine  5 mg Oral Daily  . benazepril  40 mg Oral Daily  . ezetimibe  10 mg Oral QHS  . gabapentin  400 mg Oral TID  . metoprolol succinate  25 mg Oral QPM  . rivaroxaban  20 mg Oral q morning - 10a  . rosuvastatin  10 mg Oral QHS    Family History    Family History  Problem Relation Age of Onset  .  Heart disease Father   . Hypertension Father   . Stroke Father   . Cancer Father   . Arrhythmia Sister        A-fib  . Arrhythmia Brother        A-fib   indicated that his mother is deceased. He indicated that his father is alive. He indicated that his sister is deceased. He indicated that his brother is alive.  Social History    Social History   Socioeconomic History  . Marital status: Married    Spouse name: Not on file  . Number of children: Not on file  . Years of education: Not on file  . Highest education level: Not on file  Social Needs  . Financial resource strain: Not on file  . Food insecurity - worry: Not on file  . Food insecurity - inability: Not on file  . Transportation needs - medical: Not on file  . Transportation needs - non-medical: Not on file    Occupational History  . Not on file  Tobacco Use  . Smoking status: Former Smoker    Packs/day: 1.00    Years: 10.00    Pack years: 10.00    Types: Cigarettes    Last attempt to quit: 02/18/1979    Years since quitting: 38.0  . Smokeless tobacco: Former Systems developer    Types: Snuff  Substance and Sexual Activity  . Alcohol use: Yes    Alcohol/week: 7.2 oz    Types: 12 Cans of beer per week  . Drug use: No  . Sexual activity: Not on file  Other Topics Concern  . Not on file  Social History Narrative  . Not on file     Review of Systems    General:  No chills, fever, night sweats or weight changes.  Cardiovascular:  +++ chest pain/mid-back pain and tenderness, +++ dyspnea on exertion, no edema, orthopnea, palpitations, paroxysmal nocturnal dyspnea. Dermatological: No rash, lesions/masses Respiratory: No cough, +++ dyspnea Urologic: No hematuria, dysuria Abdominal:   No nausea, vomiting, diarrhea, bright red blood per rectum, melena, or hematemesis Neurologic:  No visual changes, wkns, changes in mental status. All other systems reviewed and are otherwise negative except as noted above.  Physical Exam    Blood pressure (!) 150/85, pulse (!) 53, temperature 98.1 F (36.7 C), temperature source Oral, resp. rate 14, height 6' (1.829 m), weight (!) 320 lb (145.2 kg), SpO2 96 %.  General: Pleasant, NAD Psych: Normal affect. Neuro: Alert and oriented X 3. Moves all extremities spontaneously. HEENT: Normal  Neck: Supple.  Difficult to gauge JVP 2/2 girth.  No bruits. Lungs:  Resp regular and unlabored, CTA. Heart: RRR no s3, s4, or murmurs. Abdomen: Soft, non-tender, non-distended, BS + x 4.  Extremities: No clubbing, cyanosis or edema. DP/PT/Radials 2+ and equal bilaterally.  Labs     Recent Labs    03/13/17 1539 03/13/17 1958 03/14/17 0054  TROPONINI <0.03 <0.03 <0.03   Lab Results  Component Value Date   WBC 6.5 03/14/2017   HGB 13.6 03/14/2017   HCT 41.3 03/14/2017    MCV 87.9 03/14/2017   PLT 165 03/14/2017    Recent Labs  Lab 03/13/17 1539 03/14/17 0502  NA 140 142  K 4.5 4.0  CL 111 111  CO2 22 24  BUN 14 14  CREATININE 0.86 0.89  CALCIUM 8.7* 8.2*  PROT 6.9  --   BILITOT 0.5  --   ALKPHOS 58  --   ALT 20  --  AST 25  --   GLUCOSE 77 89   Lab Results  Component Value Date   CHOL 195 12/04/2016   HDL 82 05/22/2016   LDLCALC 88 05/22/2016   TRIG 147 12/04/2016    Radiology Studies    Dg Chest 2 View  Result Date: 03/13/2017 CLINICAL DATA:  Chest pain. EXAM: CHEST  2 VIEW COMPARISON:  CT chest dated April 28, 2015. FINDINGS: The heart size and mediastinal contours are within normal limits. Normal pulmonary vascularity. No focal consolidation, pleural effusion, or pneumothorax. Nodular density projecting over the right upper lobe corresponds to a prominent osteophyte at the right first costochondral junction. No acute osseous abnormality. IMPRESSION: No active cardiopulmonary disease. Electronically Signed   By: Titus Dubin M.D.   On: 03/13/2017 16:23   Ct Angio Chest/abd/pel For Dissection W And/or Wo Contrast  Result Date: 03/13/2017 CLINICAL DATA:  Central chest pain radiating to the back and left arm. EXAM: CT ANGIOGRAPHY CHEST, ABDOMEN AND PELVIS TECHNIQUE: Multidetector CT imaging through the chest, abdomen and pelvis was performed using the standard protocol during bolus administration of intravenous contrast. Multiplanar reconstructed images and MIPs were obtained and reviewed to evaluate the vascular anatomy. CONTRAST:  17mL ISOVUE-370 IOPAMIDOL (ISOVUE-370) INJECTION 76% COMPARISON:  CT chest dated April 27, 2005. FINDINGS: CTA CHEST FINDINGS Cardiovascular: Preferential opacification of the thoracic aorta. No evidence of thoracic aortic aneurysm or dissection. Normal heart size. No pericardial effusion. Mediastinum/Nodes: No enlarged mediastinal, hilar, or axillary lymph nodes. Thyroid gland, trachea, and esophagus demonstrate  no significant findings. Lungs/Pleura: Lungs are clear. No pleural effusion or pneumothorax. Stable 7 mm pulmonary nodule in the right lower lobe, benign. Musculoskeletal: No chest wall abnormality. No acute or significant osseous findings. Review of the MIP images confirms the above findings. CTA ABDOMEN AND PELVIS FINDINGS VASCULAR Aorta: Normal caliber aorta without aneurysm, dissection, vasculitis or significant stenosis. Mild atherosclerosis. Celiac: Patent without evidence of aneurysm, dissection, vasculitis or significant stenosis. SMA: Patent without evidence of aneurysm, dissection, vasculitis or significant stenosis. Renals: Two left and single right renal arteries are patent without evidence of aneurysm, dissection, vasculitis, fibromuscular dysplasia or significant stenosis. IMA: Patent without evidence of aneurysm, dissection, vasculitis or significant stenosis. Inflow: Patent without evidence of aneurysm, dissection, vasculitis or significant stenosis. Veins: No obvious venous abnormality within the limitations of this arterial phase study. Review of the MIP images confirms the above findings. NON-VASCULAR Hepatobiliary: No focal liver abnormality. Cholelithiasis. No gallbladder wall thickening or biliary dilatation. Pancreas: Unremarkable. No pancreatic ductal dilatation or surrounding inflammatory changes. Spleen: Normal in size without focal abnormality. Adrenals/Urinary Tract: Adrenal glands are unremarkable. Kidneys are normal, without renal calculi, focal lesion, or hydronephrosis. Bladder is unremarkable. Stomach/Bowel: Prior Roux-en-Y gastric bypass surgery. No evidence of bowel wall thickening, distention, or surrounding inflammatory changes. Normal appendix. Lymphatic: No abdominal or pelvic lymphadenopathy. Reproductive: Brachytherapy seeds within the prostate. Other: No free fluid or pneumoperitoneum. Tiny fat containing umbilical hernia. Musculoskeletal: No acute or significant osseous  findings. Bilateral L5 pars defects with trace L5-S1 anterolisthesis. Probable bone island in the left iliac bone. Review of the MIP images confirms the above findings. IMPRESSION: Vascular: 1. No evidence of acute aortic syndrome.  No aortic aneurysm. 2.  Aortic atherosclerosis (ICD10-I70.0). Chest: 1.  No acute intrathoracic process. Abdomen and pelvis: 1.  No acute intra-abdominal process. 2. Cholelithiasis. Electronically Signed   By: Titus Dubin M.D.   On: 03/13/2017 19:56    ECG & Cardiac Imaging    RSR 61, baseline artifact,  septal infarct, nonspecific st/t changes.  Assessment & Plan    1.  Atypical chest and back pain:  Pt presented to the ED with chest and mid-scapular back pain that was worse with deep breathing and palpation over the mid back.  ECG non-acute.  Trop nl x 3. CTA neg for dissection.  Feels better this AM but back still somewhat tender to touch.  Despite prolonged Ss, there is no obj evidence of ischemia.  Suspect pain is MSK as he remains tender/sore over his mid-back.  Rec ambulation today.  If stable, he may be discharged.  We will consider an outpatient 2 day stress test as he does have a h/o coronary Ca2+.  2.  PAF:  In sinus. Cont amio/xarelto.  3.  Pulm nodule:  56mm RLL nodule previously noted on CTA in 2016.  Stable in 2017 and again on CTA this admission.   Signed, Murray Hodgkins, NP 03/14/2017, 9:09 AM  For questions or updates, please contact   Please consult [email or web address] for contact info under Cardiology/STEMI.

## 2017-03-15 ENCOUNTER — Ambulatory Visit: Payer: 59 | Admitting: Family Medicine

## 2017-03-15 ENCOUNTER — Encounter: Payer: Self-pay | Admitting: Family Medicine

## 2017-03-15 VITALS — BP 122/74 | HR 65 | Temp 98.9°F | Wt 332.6 lb

## 2017-03-15 DIAGNOSIS — R079 Chest pain, unspecified: Secondary | ICD-10-CM | POA: Diagnosis not present

## 2017-03-15 LAB — HIV ANTIBODY (ROUTINE TESTING W REFLEX): HIV SCREEN 4TH GENERATION: NONREACTIVE

## 2017-03-15 MED ORDER — LIDOCAINE 5 % EX PTCH: 1.0000 | MEDICATED_PATCH | CUTANEOUS | 0 refills | Status: DC

## 2017-03-15 MED ORDER — CYCLOBENZAPRINE HCL 10 MG PO TABS: 10.0000 mg | ORAL_TABLET | Freq: Three times a day (TID) | ORAL | 0 refills | Status: DC | PRN

## 2017-03-15 NOTE — Progress Notes (Signed)
BP 122/74 (BP Location: Left Arm, Patient Position: Sitting, Cuff Size: Large)   Pulse 65   Temp 98.9 F (37.2 C) (Oral)   Wt (!) 332 lb 9.6 oz (150.9 kg)   SpO2 95%   BMI 45.11 kg/m    Subjective:    Patient ID: [name], male    DOB: February 10, 1955, 62 y.o.   MRN: [id number]  HPI: [name] is a 62 y.o. male  Chief Complaint  Patient presents with  . Hospitalization Follow-up    Patient states he's feeling a lot better since the hospital, but he's still not 100%. Appointment 03/27/17 with Dr. Rockey Situ.   Pt here today for a hospital follow up for CP. Labs and EKG in ER unremarkable. CP has improved significantly but still present. Worse with certain motions and deep breaths. Anterior shoulder and underneath shoulder blades very sore. Hasn't been taking the tramadol that he has on hand. Apt coming up in the next 2 weeks with Cardiology for further evaluation. Denies palpitations, significant SOB, syncope, HAs.   Relevant past medical, surgical, family and social history reviewed and updated as indicated. Interim medical history since our last visit reviewed. Allergies and medications reviewed and updated.  Review of Systems  Per HPI unless specifically indicated above     Objective:    BP 122/74 (BP Location: Left Arm, Patient Position: Sitting, Cuff Size: Large)   Pulse 65   Temp 98.9 F (37.2 C) (Oral)   Wt (!) 332 lb 9.6 oz (150.9 kg)   SpO2 95%   BMI 45.11 kg/m   Wt Readings from Last 3 Encounters:  03/15/17 (!) 332 lb 9.6 oz (150.9 kg)  03/13/17 (!) 320 lb (145.2 kg)  02/19/17 (!) 331 lb 12 oz (150.5 kg)    Physical Exam  Constitutional: He is oriented to person, place, and time. He appears well-developed and well-nourished. No distress.  HENT:  Head: Atraumatic.  Eyes: Conjunctivae are normal. Pupils are equal, round, and reactive to light. No scleral icterus.  Neck: Normal range of motion. Neck supple.  Cardiovascular: Normal rate and normal heart  sounds.  Pulmonary/Chest: Effort normal and breath sounds normal. No respiratory distress.  Musculoskeletal: He exhibits tenderness (Reproducible ttp over chest wall and underneath shoulder blade).  Pain reproduced by resistance to forward extension of left arm  Neurological: He is alert and oriented to person, place, and time.  Skin: Skin is warm and dry.  Psychiatric: He has a normal mood and affect. His behavior is normal.  Nursing note and vitals reviewed.  Results for orders placed or performed during the hospital encounter of 32/67/12  Basic metabolic panel  Result Value Ref Range   Sodium 140 135 - 145 mmol/L   Potassium 4.5 3.5 - 5.1 mmol/L   Chloride 111 101 - 111 mmol/L   CO2 22 22 - 32 mmol/L   Glucose, Bld 77 65 - 99 mg/dL   BUN 14 6 - 20 mg/dL   Creatinine, Ser 0.86 0.61 - 1.24 mg/dL   Calcium 8.7 (L) 8.9 - 10.3 mg/dL   GFR calc non Af Amer >60 >60 mL/min   GFR calc Af Amer >60 >60 mL/min   Anion gap 7 5 - 15  CBC  Result Value Ref Range   WBC 8.5 3.8 - 10.6 K/uL   RBC 5.01 4.40 - 5.90 MIL/uL   Hemoglobin 14.6 13.0 - 18.0 g/dL   HCT 43.9 40.0 - 52.0 %   MCV 87.6 80.0 -  100.0 fL   MCH 29.0 26.0 - 34.0 pg   MCHC 33.1 32.0 - 36.0 g/dL   RDW 14.2 11.5 - 14.5 %   Platelets 196 150 - 440 K/uL  Troponin I  Result Value Ref Range   Troponin I <0.03 <0.03 ng/mL  Hepatic function panel  Result Value Ref Range   Total Protein 6.9 6.5 - 8.1 g/dL   Albumin 4.4 3.5 - 5.0 g/dL   AST 25 15 - 41 U/L   ALT 20 17 - 63 U/L   Alkaline Phosphatase 58 38 - 126 U/L   Total Bilirubin 0.5 0.3 - 1.2 mg/dL   Bilirubin, Direct 0.1 0.1 - 0.5 mg/dL   Indirect Bilirubin 0.4 0.3 - 0.9 mg/dL  Lipase, blood  Result Value Ref Range   Lipase 28 11 - 51 U/L  Troponin I  Result Value Ref Range   Troponin I <0.03 <0.03 ng/mL  HIV antibody (Routine Testing)  Result Value Ref Range   HIV Screen 4th Generation wRfx Non Reactive Non Reactive  Troponin I  Result Value Ref Range   Troponin I  <0.03 <0.03 ng/mL  Basic metabolic panel  Result Value Ref Range   Sodium 142 135 - 145 mmol/L   Potassium 4.0 3.5 - 5.1 mmol/L   Chloride 111 101 - 111 mmol/L   CO2 24 22 - 32 mmol/L   Glucose, Bld 89 65 - 99 mg/dL   BUN 14 6 - 20 mg/dL   Creatinine, Ser 0.89 0.61 - 1.24 mg/dL   Calcium 8.2 (L) 8.9 - 10.3 mg/dL   GFR calc non Af Amer >60 >60 mL/min   GFR calc Af Amer >60 >60 mL/min   Anion gap 7 5 - 15  CBC  Result Value Ref Range   WBC 6.5 3.8 - 10.6 K/uL   RBC 4.69 4.40 - 5.90 MIL/uL   Hemoglobin 13.6 13.0 - 18.0 g/dL   HCT 41.3 40.0 - 52.0 %   MCV 87.9 80.0 - 100.0 fL   MCH 28.9 26.0 - 34.0 pg   MCHC 32.9 32.0 - 36.0 g/dL   RDW 14.2 11.5 - 14.5 %   Platelets 165 150 - 440 K/uL      Assessment & Plan:   Problem List Items Addressed This Visit    None    Visit Diagnoses    Chest pain, unspecified type    -  Primary   Suspect more musculoskeletal than cardiac but await Cardiology consult. ER workup neg. Tramadol prn, lidocaine patches, muscle relaxers. F/u as needed       Follow up plan: Return for Cardiology consult as scheduled.

## 2017-03-15 NOTE — Discharge Summary (Signed)
[facility] NAME: [name]    MR#:  [id number]  DATE OF BIRTH:  01-03-1956  DATE OF ADMISSION:  03/13/2017 ADMITTING PHYSICIAN: Lance Coon, MD  DATE OF DISCHARGE: 03/14/2017 12:05 PM  PRIMARY CARE PHYSICIAN: Guadalupe Maple, MD    ADMISSION DIAGNOSIS:  Chest pain, unspecified type [R07.9]  DISCHARGE DIAGNOSIS:  Principal Problem:   Angina pectoris ([place]) Active Problems:   Essential hypertension   Hyperlipidemia   Paroxysmal atrial fibrillation ([place])   SECONDARY DIAGNOSIS:   Past Medical History:  Diagnosis Date  . Cardiomyopathy (in setting of Afib)    a. 12/2013 Echo: EF 45-50%, mild ant and antsept HK. mild MR. Mod dil LA. nl RV fxn. Rhythm was Afib.  Marland Kitchen DJD (degenerative joint disease) of knee   . History of kidney stones   . Intervertebral disc disorder with radiculopathy of lumbosacral region   . Kidney stone 2012  . Lower extremity edema   . PAF (paroxysmal atrial fibrillation) ([place])   . Pernicious anemia   . Prostate cancer ([place])   . Pulmonary nodule, right    a. 10/2014 Cardiac CTA: 47mm RLL nodule; b. 04/2015 CT Chest: stable 44mm RLL nodule. No new nodules; 02/2017 CTA Chest: stable, benign, 73mm RLL pulm nodule.  . SVT (supraventricular tachycardia) [facility])     HOSPITAL COURSE:   62 year old male with past medical history of degenerative disc disease, paroxysmal atrial fibrillation, prostate cancer, history of SVT, obesity who was admitted to the hospital due to chest pain.  1. Chest pain-given patient's significant cardiac history who was observed overnight on telemetry. He had 3 sets of cardiac markers checked which were negative. A cardiology consult was obtained. They did not think that the patient's and was related to angina or any cardiac chest pain. -This was likely musculoskeletal in nature. Patient was ambulated and did not have any further symptoms and therefore was discharged home with follow-up with  cardiology as an outpatient.  2. Paroxysmal atrial fibrillation-patient remained rate controlled. He will continue Xarelto, amiodarone  3. Essential hypertension-patient will resume his metoprolol, benazepril, Norvasc  4. Neuropathy-patient will resume his gabapentin.  5. Pulmonary nodule-patient noted to have a 7 mm right lower lobe nodule previously noted on CTA 2016, stable in 2017 and again on CTA during this hospitalization. This can be further followed as an outpatient.  DISCHARGE CONDITIONS:   Stable  CONSULTS OBTAINED:  Treatment Team:  Minna Merritts, MD  DRUG ALLERGIES:  No Known Allergies  DISCHARGE MEDICATIONS:   Allergies as of 03/14/2017   No Known Allergies     Medication List    TAKE these medications   amiodarone 200 MG tablet Commonly known as:  PACERONE TAKE 1 TABLET(200 MG) BY MOUTH DAILY   amLODipine 5 MG tablet Commonly known as:  NORVASC TAKE 1 TABLET(5 MG) BY MOUTH DAILY   benazepril 40 MG tablet Commonly known as:  LOTENSIN TAKE 1 TABLET(40 MG) BY MOUTH DAILY   cyanocobalamin 1000 MCG/ML injection Commonly known as:  (VITAMIN B-12) Inject 1 mL (1,000 mcg total) into the muscle every 30 (thirty) days.   ezetimibe 10 MG tablet Commonly known as:  ZETIA Take 1 tablet (10 mg total) by mouth daily. What changed:  when to take this   furosemide 20 MG tablet Commonly known as:  LASIX Take 1 tablet (20 mg total) by mouth daily as needed.   gabapentin 400 MG capsule Commonly known as:  NEURONTIN Take  1 capsule (400 mg total) by mouth 3 (three) times daily.   metoprolol succinate 25 MG 24 hr tablet Commonly known as:  TOPROL-XL TAKE 1 TABLET(25 MG) BY MOUTH DAILY What changed:    how much to take  how to take this  when to take this  additional instructions   rivaroxaban 20 MG Tabs tablet Commonly known as:  XARELTO Take 1 tablet (20 mg total) by mouth daily. What changed:  when to take this   rosuvastatin 10 MG  tablet Commonly known as:  CRESTOR Take 1 tablet (10 mg total) by mouth at bedtime.   traMADol 50 MG tablet Commonly known as:  ULTRAM Take 1 tablet (50 mg total) every 6 (six) hours as needed by mouth. What changed:    how much to take  when to take this  additional instructions   traZODone 50 MG tablet Commonly known as:  DESYREL Take 1 tablet (50 mg total) by mouth at bedtime as needed for sleep.         DISCHARGE INSTRUCTIONS:   DIET:  Cardiac diet  DISCHARGE CONDITION:  Stable  ACTIVITY:  Activity as tolerated  OXYGEN:  Home Oxygen: No.   Oxygen Delivery: room air  DISCHARGE LOCATION:  home   If you experience worsening of your admission symptoms, develop shortness of breath, life threatening emergency, suicidal or homicidal thoughts you must seek medical attention immediately by calling 911 or calling your MD immediately  if symptoms less severe.  You Must read complete instructions/literature along with all the possible adverse reactions/side effects for all the Medicines you take and that have been prescribed to you. Take any new Medicines after you have completely understood and accpet all the possible adverse reactions/side effects.   Please note  You were cared for by a hospitalist during your hospital stay. If you have any questions about your discharge medications or the care you received while you were in the hospital after you are discharged, you can call the unit and asked to speak with the hospitalist on call if the hospitalist that took care of you is not available. Once you are discharged, your primary care physician will handle any further medical issues. Please note that NO REFILLS for any discharge medications will be authorized once you are discharged, as it is imperative that you return to your primary care physician (or establish a relationship with a primary care physician if you do not have one) for your aftercare needs so that they can  reassess your need for medications and monitor your lab values.     Today   Chest pain, shortness of breath has resolved.  VITAL SIGNS:  Blood pressure (!) 150/85, pulse (!) 53, temperature 98.1 F (36.7 C), temperature source Oral, resp. rate 14, height 6' (1.829 m), weight (!) 145.2 kg (320 lb), SpO2 96 %.  I/O:  No intake or output data in the 24 hours ending 03/15/17 1450  PHYSICAL EXAMINATION:  GENERAL:  62 y.o.-year-old obese patient lying in the bed in no acute distress.  EYES: Pupils equal, round, reactive to light and accommodation. No scleral icterus. Extraocular muscles intact.  HEENT: Head atraumatic, normocephalic. Oropharynx and nasopharynx clear.  NECK:  Supple, no jugular venous distention. No thyroid enlargement, no tenderness.  LUNGS: Normal breath sounds bilaterally, no wheezing, rales,rhonchi. No use of accessory muscles of respiration.  CARDIOVASCULAR: S1, S2 normal. No murmurs, rubs, or gallops.  ABDOMEN: Soft, non-tender, non-distended. Bowel sounds present. No organomegaly or mass.  EXTREMITIES:  No pedal edema, cyanosis, or clubbing.  NEUROLOGIC: Cranial nerves II through XII are intact. No focal motor or sensory defecits b/l.  PSYCHIATRIC: The patient is alert and oriented x 3.  SKIN: No obvious rash, lesion, or ulcer.   DATA REVIEW:   CBC Recent Labs  Lab 03/14/17 0502  WBC 6.5  HGB 13.6  HCT 41.3  PLT 165    Chemistries  Recent Labs  Lab 03/13/17 1539 03/14/17 0502  NA 140 142  K 4.5 4.0  CL 111 111  CO2 22 24  GLUCOSE 77 89  BUN 14 14  CREATININE 0.86 0.89  CALCIUM 8.7* 8.2*  AST 25  --   ALT 20  --   ALKPHOS 58  --   BILITOT 0.5  --     Cardiac Enzymes Recent Labs  Lab 03/14/17 0054  TROPONINI <0.03    Microbiology Results  Results for orders placed or performed in visit on 05/22/16  Microscopic Examination     Status: None   Collection Time: 05/22/16 10:55 AM  Result Value Ref Range Status   WBC, UA None seen 0 - 5  /hpf Final   RBC, UA None seen 0 - 2 /hpf Final   Epithelial Cells (non renal) CANCELED      Comment: Test not performed  Result canceled by the ancillary    Bacteria, UA None seen None seen/Few Final    RADIOLOGY:  Dg Chest 2 View  Result Date: 03/13/2017 CLINICAL DATA:  Chest pain. EXAM: CHEST  2 VIEW COMPARISON:  CT chest dated April 28, 2015. FINDINGS: The heart size and mediastinal contours are within normal limits. Normal pulmonary vascularity. No focal consolidation, pleural effusion, or pneumothorax. Nodular density projecting over the right upper lobe corresponds to a prominent osteophyte at the right first costochondral junction. No acute osseous abnormality. IMPRESSION: No active cardiopulmonary disease. Electronically Signed   By: Titus Dubin M.D.   On: 03/13/2017 16:23   Ct Angio Chest/abd/pel For Dissection W And/or Wo Contrast  Result Date: 03/13/2017 CLINICAL DATA:  Central chest pain radiating to the back and left arm. EXAM: CT ANGIOGRAPHY CHEST, ABDOMEN AND PELVIS TECHNIQUE: Multidetector CT imaging through the chest, abdomen and pelvis was performed using the standard protocol during bolus administration of intravenous contrast. Multiplanar reconstructed images and MIPs were obtained and reviewed to evaluate the vascular anatomy. CONTRAST:  185mL ISOVUE-370 IOPAMIDOL (ISOVUE-370) INJECTION 76% COMPARISON:  CT chest dated April 27, 2005. FINDINGS: CTA CHEST FINDINGS Cardiovascular: Preferential opacification of the thoracic aorta. No evidence of thoracic aortic aneurysm or dissection. Normal heart size. No pericardial effusion. Mediastinum/Nodes: No enlarged mediastinal, hilar, or axillary lymph nodes. Thyroid gland, trachea, and esophagus demonstrate no significant findings. Lungs/Pleura: Lungs are clear. No pleural effusion or pneumothorax. Stable 7 mm pulmonary nodule in the right lower lobe, benign. Musculoskeletal: No chest wall abnormality. No acute or significant osseous  findings. Review of the MIP images confirms the above findings. CTA ABDOMEN AND PELVIS FINDINGS VASCULAR Aorta: Normal caliber aorta without aneurysm, dissection, vasculitis or significant stenosis. Mild atherosclerosis. Celiac: Patent without evidence of aneurysm, dissection, vasculitis or significant stenosis. SMA: Patent without evidence of aneurysm, dissection, vasculitis or significant stenosis. Renals: Two left and single right renal arteries are patent without evidence of aneurysm, dissection, vasculitis, fibromuscular dysplasia or significant stenosis. IMA: Patent without evidence of aneurysm, dissection, vasculitis or significant stenosis. Inflow: Patent without evidence of aneurysm, dissection, vasculitis or significant stenosis. Veins: No obvious venous abnormality within the limitations of this  arterial phase study. Review of the MIP images confirms the above findings. NON-VASCULAR Hepatobiliary: No focal liver abnormality. Cholelithiasis. No gallbladder wall thickening or biliary dilatation. Pancreas: Unremarkable. No pancreatic ductal dilatation or surrounding inflammatory changes. Spleen: Normal in size without focal abnormality. Adrenals/Urinary Tract: Adrenal glands are unremarkable. Kidneys are normal, without renal calculi, focal lesion, or hydronephrosis. Bladder is unremarkable. Stomach/Bowel: Prior Roux-en-Y gastric bypass surgery. No evidence of bowel wall thickening, distention, or surrounding inflammatory changes. Normal appendix. Lymphatic: No abdominal or pelvic lymphadenopathy. Reproductive: Brachytherapy seeds within the prostate. Other: No free fluid or pneumoperitoneum. Tiny fat containing umbilical hernia. Musculoskeletal: No acute or significant osseous findings. Bilateral L5 pars defects with trace L5-S1 anterolisthesis. Probable bone island in the left iliac bone. Review of the MIP images confirms the above findings. IMPRESSION: Vascular: 1. No evidence of acute aortic syndrome.   No aortic aneurysm. 2.  Aortic atherosclerosis (ICD10-I70.0). Chest: 1.  No acute intrathoracic process. Abdomen and pelvis: 1.  No acute intra-abdominal process. 2. Cholelithiasis. Electronically Signed   By: Titus Dubin M.D.   On: 03/13/2017 19:56      Management plans discussed with the patient, family and they are in agreement.  CODE STATUS:  Code Status History    Date Active Date Inactive Code Status Order ID Comments User Context   03/14/2017 00:28 03/14/2017 15:10 Full Code [id number]  Lance Coon, MD Inpatient      TOTAL TIME TAKING CARE OF THIS PATIENT: 40 minutes.    Henreitta Leber M.D on 03/15/2017 at 2:50 PM  Between 7am to 6pm - Pager - [phone]  After 6pm go to [email or web address] - Proofreader  [facility]  Office  [phone]  CC: Primary care physician; Guadalupe Maple, MD

## 2017-03-18 NOTE — Patient Instructions (Signed)
Follow up as needed

## 2017-03-19 ENCOUNTER — Telehealth: Payer: Self-pay | Admitting: Family Medicine

## 2017-03-19 NOTE — Telephone Encounter (Signed)
Copied from [place] [id number]. Topic: General - Other >> Mar 19, 2017  7:10 PM Cecelia Byars, NT wrote: Reason for CRM: patient called and said the the pharmacy needs a prior authorization for  lidocaine (LIDODERM) 5 % sent to the pharmacy at [place], [place] - [address] AT [address] [phone] (Phone) [phone] (Fax)

## 2017-03-20 NOTE — Telephone Encounter (Signed)
[id number]

## 2017-03-20 NOTE — Telephone Encounter (Signed)
Prior authorization initiated via covermymeds KEY [id number]

## 2017-03-21 ENCOUNTER — Ambulatory Visit: Payer: 59 | Admitting: Cardiovascular Disease

## 2017-03-21 ENCOUNTER — Telehealth: Payer: Self-pay | Admitting: Family Medicine

## 2017-03-21 NOTE — Telephone Encounter (Signed)
Copied from [place] [phone]. Topic: General - Other >> Mar 19, 2017  7:10 PM Cecelia Byars, NT wrote: Reason for CRM: patient called and said the the pharmacy needs a prior authorization for  lidocaine (LIDODERM) 5 % sent to the pharmacy at [place], [place] - [address] AT [address] [phone] (Phone) [phone] (Fax)    >> Mar 21, 2017 10:05 AM Neva Seat wrote: [facility] [place] - [address], [place] [facility] [address] AT [address]  [facility] [place] [address]  Phone: [phone] Fax: [phone]   Pt called to let office know [facility] cannot fill his Rx without prior authorization. Please have Rx filled asap.

## 2017-03-21 NOTE — Telephone Encounter (Signed)
Please see prior telephone encounter. P.A. Was initiated yesterday.

## 2017-03-21 NOTE — Telephone Encounter (Signed)
See note

## 2017-03-22 NOTE — Telephone Encounter (Signed)
The patient stated that he knows he can buy the medication over the counter, but it's not the same strength. Please advise.

## 2017-03-22 NOTE — Telephone Encounter (Signed)
PA denied. Any other therapies patient can use?

## 2017-03-22 NOTE — Telephone Encounter (Signed)
He can grab them OTC if his insurance will not cover it

## 2017-03-23 NOTE — Telephone Encounter (Signed)
Routing to provider  

## 2017-03-23 NOTE — Telephone Encounter (Signed)
Patient notified and verbalized understanding.  He is going to try the OTC Patches.

## 2017-03-23 NOTE — Telephone Encounter (Signed)
I'm aware, it's minimal difference and will be much cheaper. Worth a try
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
## 2017-03-27 ENCOUNTER — Ambulatory Visit: Payer: 59 | Admitting: Cardiovascular Disease

## 2017-04-15 ENCOUNTER — Other Ambulatory Visit: Payer: Self-pay | Admitting: Family Medicine

## 2017-04-16 NOTE — Telephone Encounter (Signed)
Rx refill  Flexeril LOV  04/03/2017 Pharmacy on File

## 2017-04-26 DIAGNOSIS — H02823 Cysts of right eye, unspecified eyelid: Secondary | ICD-10-CM | POA: Diagnosis not present

## 2017-06-14 ENCOUNTER — Encounter: Payer: 59 | Admitting: Family Medicine

## 2017-06-18 ENCOUNTER — Encounter: Payer: 59 | Admitting: Family Medicine

## 2017-06-19 ENCOUNTER — Other Ambulatory Visit: Payer: Self-pay | Admitting: Family Medicine

## 2017-06-20 NOTE — Telephone Encounter (Signed)
Trazodone 50 mg refill request  LOV 05/22/16 with Dr. Jeananne Rama.    Needs appt  [facility] [place], [place]

## 2017-06-24 ENCOUNTER — Other Ambulatory Visit: Payer: Self-pay | Admitting: Family Medicine

## 2017-06-25 ENCOUNTER — Encounter: Payer: 59 | Admitting: Family Medicine

## 2017-06-25 NOTE — Telephone Encounter (Signed)
cyclobenzaprine refill Last Refill:04/16/17 # 60 Last OV: 03/15/17 PCP: Dr. Jeananne Rama Pharmacy:[facility] [address]

## 2017-07-03 ENCOUNTER — Encounter: Payer: Self-pay | Admitting: Family Medicine

## 2017-07-03 ENCOUNTER — Ambulatory Visit (INDEPENDENT_AMBULATORY_CARE_PROVIDER_SITE_OTHER): Payer: 59 | Admitting: Family Medicine

## 2017-07-03 DIAGNOSIS — D510 Vitamin B12 deficiency anemia due to intrinsic factor deficiency: Secondary | ICD-10-CM

## 2017-07-03 DIAGNOSIS — Z0001 Encounter for general adult medical examination with abnormal findings: Secondary | ICD-10-CM | POA: Diagnosis not present

## 2017-07-03 DIAGNOSIS — I10 Essential (primary) hypertension: Secondary | ICD-10-CM

## 2017-07-03 DIAGNOSIS — M1731 Unilateral post-traumatic osteoarthritis, right knee: Secondary | ICD-10-CM

## 2017-07-03 DIAGNOSIS — I480 Paroxysmal atrial fibrillation: Secondary | ICD-10-CM

## 2017-07-03 DIAGNOSIS — E782 Mixed hyperlipidemia: Secondary | ICD-10-CM | POA: Diagnosis not present

## 2017-07-03 MED ORDER — CYANOCOBALAMIN 1000 MCG/ML IJ SOLN: 1000.0000 ug | INTRAMUSCULAR | 12 refills | Status: DC

## 2017-07-03 MED ORDER — MIRTAZAPINE 15 MG PO TABS: 15.0000 mg | ORAL_TABLET | Freq: Every day | ORAL | 6 refills | Status: DC

## 2017-07-03 MED ORDER — TRAMADOL HCL 50 MG PO TABS: 50.0000 mg | ORAL_TABLET | Freq: Two times a day (BID) | ORAL | 5 refills | Status: DC

## 2017-07-03 NOTE — Assessment & Plan Note (Signed)
The current medical regimen is effective;  continue present plan and medications.  

## 2017-07-03 NOTE — Assessment & Plan Note (Signed)
Ordinarily good control but exceptionally stressful day today will leave blood pressure alone.

## 2017-07-03 NOTE — Assessment & Plan Note (Signed)
Followed by cardiology recently started on Zetia

## 2017-07-03 NOTE — Assessment & Plan Note (Signed)
Tramadol allows him to continue work without problems.

## 2017-07-03 NOTE — Addendum Note (Signed)
Addended by: Golden Pop A on: 07/03/2017 05:04 PM   Modules accepted: Orders

## 2017-07-03 NOTE — Assessment & Plan Note (Signed)
Followed by cardiology 

## 2017-07-03 NOTE — Progress Notes (Signed)
BP (!) 148/88 (BP Location: Left Arm)   Pulse (!) 114   Ht 5\' 11"  (1.803 m)   Wt (!) 333 lb (151 kg)   SpO2 97%   BMI 46.44 kg/m    Subjective:    Patient ID: [name], male    DOB: 11-17-1955, 62 y.o.   MRN: [id number]  HPI: [name] is a 62 y.o. male  Chief Complaint  Patient presents with  . Annual Exam    Concerns w/ sleep. Would like referral to [facility] for L hip   Multiple issues going home with patient. First patient's left hip has started deterioration and is becoming more more painful patient will make appointment at orthopedics to further evaluate.  Concerned its time for a new hip. Ongoing insomnia has taken trazodone Tylenol PM together with no real effect has tried an 8-day stop and then restarted medications with no increase in effectiveness.  Continuing to take medication in spite of no effectiveness. Blood pressure on chart review is largely been good had some occasional elevated readings. Taking medications without problems. Followed by urology and cardiology with good reports stable. Taking Xarelto without problems bleeding bruising issues. Relevant past medical, surgical, family and social history reviewed and updated as indicated. Interim medical history since our last visit reviewed. Allergies and medications reviewed and updated.  Review of Systems  Constitutional: Negative.   HENT: Negative.   Eyes: Negative.   Respiratory: Negative.   Cardiovascular: Negative.   Gastrointestinal: Negative.   Endocrine: Negative.   Genitourinary: Negative.   Musculoskeletal: Negative.   Skin: Negative.   Allergic/Immunologic: Negative.   Neurological: Negative.   Hematological: Negative.   Psychiatric/Behavioral: Negative.     Per HPI unless specifically indicated above     Objective:    BP (!) 148/88 (BP Location: Left Arm)   Pulse (!) 114   Ht 5\' 11"  (1.803 m)   Wt (!) 333 lb (151 kg)   SpO2 97%   BMI 46.44 kg/m   Wt Readings from Last 3  Encounters:  07/03/17 (!) 333 lb (151 kg)  03/15/17 (!) 332 lb 9.6 oz (150.9 kg)  03/13/17 (!) 320 lb (145.2 kg)    Physical Exam  Constitutional: He is oriented to person, place, and time. He appears well-developed and well-nourished.  HENT:  Head: Normocephalic and atraumatic.  Right Ear: External ear normal.  Left Ear: External ear normal.  Eyes: Pupils are equal, round, and reactive to light. Conjunctivae and EOM are normal.  Neck: Normal range of motion. Neck supple.  Cardiovascular: Normal rate, regular rhythm, normal heart sounds and intact distal pulses.  Pulmonary/Chest: Effort normal and breath sounds normal.  Abdominal: Soft. Bowel sounds are normal. There is no splenomegaly or hepatomegaly.  Genitourinary: Rectum normal and penis normal.  Musculoskeletal: Normal range of motion.  Neurological: He is alert and oriented to person, place, and time. He has normal reflexes.  Skin: No rash noted. No erythema.  Psychiatric: He has a normal mood and affect. His behavior is normal. Judgment and thought content normal.    Results for orders placed or performed during the hospital encounter of 54/27/06  Basic metabolic panel  Result Value Ref Range   Sodium 140 135 - 145 mmol/L   Potassium 4.5 3.5 - 5.1 mmol/L   Chloride 111 101 - 111 mmol/L   CO2 22 22 - 32 mmol/L   Glucose, Bld 77 65 - 99 mg/dL   BUN 14 6 - 20 mg/dL  Creatinine, Ser 0.86 0.61 - 1.24 mg/dL   Calcium 8.7 (L) 8.9 - 10.3 mg/dL   GFR calc non Af Amer >60 >60 mL/min   GFR calc Af Amer >60 >60 mL/min   Anion gap 7 5 - 15  CBC  Result Value Ref Range   WBC 8.5 3.8 - 10.6 K/uL   RBC 5.01 4.40 - 5.90 MIL/uL   Hemoglobin 14.6 13.0 - 18.0 g/dL   HCT 43.9 40.0 - 52.0 %   MCV 87.6 80.0 - 100.0 fL   MCH 29.0 26.0 - 34.0 pg   MCHC 33.1 32.0 - 36.0 g/dL   RDW 14.2 11.5 - 14.5 %   Platelets 196 150 - 440 K/uL  Troponin I  Result Value Ref Range   Troponin I <0.03 <0.03 ng/mL  Hepatic function panel  Result Value  Ref Range   Total Protein 6.9 6.5 - 8.1 g/dL   Albumin 4.4 3.5 - 5.0 g/dL   AST 25 15 - 41 U/L   ALT 20 17 - 63 U/L   Alkaline Phosphatase 58 38 - 126 U/L   Total Bilirubin 0.5 0.3 - 1.2 mg/dL   Bilirubin, Direct 0.1 0.1 - 0.5 mg/dL   Indirect Bilirubin 0.4 0.3 - 0.9 mg/dL  Lipase, blood  Result Value Ref Range   Lipase 28 11 - 51 U/L  Troponin I  Result Value Ref Range   Troponin I <0.03 <0.03 ng/mL  HIV antibody (Routine Testing)  Result Value Ref Range   HIV Screen 4th Generation wRfx Non Reactive Non Reactive  Troponin I  Result Value Ref Range   Troponin I <0.03 <0.03 ng/mL  Basic metabolic panel  Result Value Ref Range   Sodium 142 135 - 145 mmol/L   Potassium 4.0 3.5 - 5.1 mmol/L   Chloride 111 101 - 111 mmol/L   CO2 24 22 - 32 mmol/L   Glucose, Bld 89 65 - 99 mg/dL   BUN 14 6 - 20 mg/dL   Creatinine, Ser 0.89 0.61 - 1.24 mg/dL   Calcium 8.2 (L) 8.9 - 10.3 mg/dL   GFR calc non Af Amer >60 >60 mL/min   GFR calc Af Amer >60 >60 mL/min   Anion gap 7 5 - 15  CBC  Result Value Ref Range   WBC 6.5 3.8 - 10.6 K/uL   RBC 4.69 4.40 - 5.90 MIL/uL   Hemoglobin 13.6 13.0 - 18.0 g/dL   HCT 41.3 40.0 - 52.0 %   MCV 87.9 80.0 - 100.0 fL   MCH 28.9 26.0 - 34.0 pg   MCHC 32.9 32.0 - 36.0 g/dL   RDW 14.2 11.5 - 14.5 %   Platelets 165 150 - 440 K/uL      Assessment & Plan:   Problem List Items Addressed This Visit      Cardiovascular and Mediastinum   Essential hypertension    Ordinarily good control but exceptionally stressful day today will leave blood pressure alone.      Paroxysmal atrial fibrillation (HCC)    Followed by cardiology        Musculoskeletal and Integument   DJD (degenerative joint disease) of knee    Tramadol allows him to continue work without problems.      Relevant Medications   traMADol (ULTRAM) 50 MG tablet     Other   Hyperlipidemia    Followed by cardiology recently started on Zetia      Pernicious anemia    The current medical  regimen  is effective;  continue present plan and medications.       Relevant Medications   cyanocobalamin (,VITAMIN B-12,) 1000 MCG/ML injection       Follow up plan: Return in about 6 months (around 01/02/2018), or if symptoms worsen or fail to improve.

## 2017-08-06 ENCOUNTER — Ambulatory Visit: Payer: Self-pay | Admitting: *Deleted

## 2017-08-06 NOTE — Telephone Encounter (Signed)
Pt called with having been awaken last night with a sharp in his left groin. He states it is about 3 inches from his penis. He does have a hx of prostate cancer and has had radiation clips inserted. So he is not sure what is causing this pain. He can not tell if he has swelling in the groin area. No discharge. He has not injured himself. He does have some rectal bleeding that has been going on and has been reported to his pcp. And he is taking iron.  Pt requesting an appointment. No distress noted today. No protocol found. Will route to flow at  [facility].   Answer Assessment - Initial Assessment Questions 1. LOCATION: "Where is the swollen node located?" "Is the matching node on the other side of the body also swollen?"      Left groin 2. SIZE: "How big is the node?" (Inches or centimeters) (or compare to common objects such as pea, bean, marble, golf ball)      Unable to feel at this time 3. ONSET: "When did the swelling start?"      Not sure if it is swollen 4. NECK NODES: "Is there a sore throat, runny nose or other symptoms of a cold?"     Abscessed  tooth 5. GROIN OR ARMPIT NODES: "Is there a sore, scratch, cut or painful red area on that arm or leg?"      no 6. FEVER: "Do you have a fever?" If so, ask: "What is it, how was it measured, and when did it start?"      No fever 7. CAUSE: "What do you think is causing the swollen lymph nodes?"     Not sure 8. OTHER SYMPTOMS: "Do you have any other symptoms?"     Blood in stool  Protocols used: [place]

## 2017-08-08 ENCOUNTER — Ambulatory Visit (INDEPENDENT_AMBULATORY_CARE_PROVIDER_SITE_OTHER): Payer: 59 | Admitting: Family Medicine

## 2017-08-08 ENCOUNTER — Encounter: Payer: Self-pay | Admitting: Family Medicine

## 2017-08-08 DIAGNOSIS — R1032 Left lower quadrant pain: Secondary | ICD-10-CM

## 2017-08-08 DIAGNOSIS — K625 Hemorrhage of anus and rectum: Secondary | ICD-10-CM | POA: Diagnosis not present

## 2017-08-08 DIAGNOSIS — R109 Unspecified abdominal pain: Secondary | ICD-10-CM | POA: Insufficient documentation

## 2017-08-08 LAB — URINALYSIS, ROUTINE W REFLEX MICROSCOPIC
Bilirubin, UA: NEGATIVE
Glucose, UA: NEGATIVE
Ketones, UA: NEGATIVE
LEUKOCYTES UA: NEGATIVE
Nitrite, UA: NEGATIVE
PH UA: 5 (ref 5.0–7.5)
PROTEIN UA: NEGATIVE
RBC, UA: NEGATIVE
SPEC GRAV UA: 1.025 (ref 1.005–1.030)
Urobilinogen, Ur: 0.2 mg/dL (ref 0.2–1.0)

## 2017-08-08 LAB — CBC WITH DIFFERENTIAL/PLATELET
Hematocrit: 41.6 % (ref 37.5–51.0)
Hemoglobin: 14 g/dL (ref 13.0–17.7)
Lymphocytes Absolute: 1.9 10*3/uL (ref 0.7–3.1)
Lymphs: 33 %
MCH: 29.5 pg (ref 26.6–33.0)
MCHC: 33.7 g/dL (ref 31.5–35.7)
MCV: 88 fL (ref 79–97)
MID (Absolute): 0.8 10*3/uL (ref 0.1–1.6)
MID: 13 %
NEUTROS ABS: 2.9 10*3/uL (ref 1.4–7.0)
Neutrophils: 53 %
PLATELETS: 183 10*3/uL (ref 150–450)
RBC: 4.74 x10E6/uL (ref 4.14–5.80)
RDW: 14.4 % (ref 12.3–15.4)
WBC: 5.6 10*3/uL (ref 3.4–10.8)

## 2017-08-08 NOTE — Progress Notes (Signed)
BP 115/75 (BP Location: Left Arm, Patient Position: Sitting, Cuff Size: Normal)   Pulse 65   Temp (!) 97.4 F (36.3 C) (Oral)   Ht 5\' 11"  (1.803 m)   Wt (!) 339 lb 12.8 oz (154.1 kg)   SpO2 92%   BMI 47.39 kg/m    Subjective:    Patient ID: [name], male    DOB: May 27, 1955, 62 y.o.   MRN: [id number]  HPI: [name] is a 62 y.o. male  Chief Complaint  Patient presents with  . Groin Pain    Ongoing for 3 days   Patient accompanied by his wife who assists with history. Patient with left groin pain above and to the left of his penis with sharp intermittent pain coming and going waking him from sleep's been ongoing intermittently for the last 3 days.  Seems to come on more at rest.  Has noticed no blood in his urine.  Has had some bright red rectal bleeding last week stop Xarelto during the rectal bleeding went away as restarted Xarelto and now has occasional little bit of spotting.  No other associated pain or discomfort has not noticed any bulging or any changes in his testicles.  Relevant past medical, surgical, family and social history reviewed and updated as indicated. Interim medical history since our last visit reviewed. Allergies and medications reviewed and updated.  Review of Systems  Constitutional: Negative.   Respiratory: Negative.   Cardiovascular: Negative.   Gastrointestinal: Negative.     Per HPI unless specifically indicated above     Objective:    BP 115/75 (BP Location: Left Arm, Patient Position: Sitting, Cuff Size: Normal)   Pulse 65   Temp (!) 97.4 F (36.3 C) (Oral)   Ht 5\' 11"  (1.803 m)   Wt (!) 339 lb 12.8 oz (154.1 kg)   SpO2 92%   BMI 47.39 kg/m   Wt Readings from Last 3 Encounters:  08/08/17 (!) 339 lb 12.8 oz (154.1 kg)  07/03/17 (!) 333 lb (151 kg)  03/15/17 (!) 332 lb 9.6 oz (150.9 kg)    Physical Exam  Constitutional: He is oriented to person, place, and time. He appears well-developed and well-nourished.  HENT:  Head:  Normocephalic and atraumatic.  Eyes: Conjunctivae and EOM are normal.  Neck: Normal range of motion.  Cardiovascular: Normal rate, regular rhythm and normal heart sounds.  Pulmonary/Chest: Effort normal and breath sounds normal.  Abdominal: Soft. Bowel sounds are normal. He exhibits no distension.  Genitourinary: Rectum normal and penis normal.  Genitourinary Comments: No evidence of hernia slight tenderness in musculature but no bulging Status post seed implant of prostate with atrophy no abnormality palpated  Musculoskeletal: Normal range of motion.  Neurological: He is alert and oriented to person, place, and time.  Skin: No erythema.  Psychiatric: He has a normal mood and affect. His behavior is normal. Judgment and thought content normal.    Results for orders placed or performed during the hospital encounter of 07/11/92  Basic metabolic panel  Result Value Ref Range   Sodium 140 135 - 145 mmol/L   Potassium 4.5 3.5 - 5.1 mmol/L   Chloride 111 101 - 111 mmol/L   CO2 22 22 - 32 mmol/L   Glucose, Bld 77 65 - 99 mg/dL   BUN 14 6 - 20 mg/dL   Creatinine, Ser 0.86 0.61 - 1.24 mg/dL   Calcium 8.7 (L) 8.9 - 10.3 mg/dL   GFR calc non Af Amer >60 >  60 mL/min   GFR calc Af Amer >60 >60 mL/min   Anion gap 7 5 - 15  CBC  Result Value Ref Range   WBC 8.5 3.8 - 10.6 K/uL   RBC 5.01 4.40 - 5.90 MIL/uL   Hemoglobin 14.6 13.0 - 18.0 g/dL   HCT 43.9 40.0 - 52.0 %   MCV 87.6 80.0 - 100.0 fL   MCH 29.0 26.0 - 34.0 pg   MCHC 33.1 32.0 - 36.0 g/dL   RDW 14.2 11.5 - 14.5 %   Platelets 196 150 - 440 K/uL  Troponin I  Result Value Ref Range   Troponin I <0.03 <0.03 ng/mL  Hepatic function panel  Result Value Ref Range   Total Protein 6.9 6.5 - 8.1 g/dL   Albumin 4.4 3.5 - 5.0 g/dL   AST 25 15 - 41 U/L   ALT 20 17 - 63 U/L   Alkaline Phosphatase 58 38 - 126 U/L   Total Bilirubin 0.5 0.3 - 1.2 mg/dL   Bilirubin, Direct 0.1 0.1 - 0.5 mg/dL   Indirect Bilirubin 0.4 0.3 - 0.9 mg/dL    Lipase, blood  Result Value Ref Range   Lipase 28 11 - 51 U/L  Troponin I  Result Value Ref Range   Troponin I <0.03 <0.03 ng/mL  HIV antibody (Routine Testing)  Result Value Ref Range   HIV Screen 4th Generation wRfx Non Reactive Non Reactive  Troponin I  Result Value Ref Range   Troponin I <0.03 <0.03 ng/mL  Basic metabolic panel  Result Value Ref Range   Sodium 142 135 - 145 mmol/L   Potassium 4.0 3.5 - 5.1 mmol/L   Chloride 111 101 - 111 mmol/L   CO2 24 22 - 32 mmol/L   Glucose, Bld 89 65 - 99 mg/dL   BUN 14 6 - 20 mg/dL   Creatinine, Ser 0.89 0.61 - 1.24 mg/dL   Calcium 8.2 (L) 8.9 - 10.3 mg/dL   GFR calc non Af Amer >60 >60 mL/min   GFR calc Af Amer >60 >60 mL/min   Anion gap 7 5 - 15  CBC  Result Value Ref Range   WBC 6.5 3.8 - 10.6 K/uL   RBC 4.69 4.40 - 5.90 MIL/uL   Hemoglobin 13.6 13.0 - 18.0 g/dL   HCT 41.3 40.0 - 52.0 %   MCV 87.9 80.0 - 100.0 fL   MCH 28.9 26.0 - 34.0 pg   MCHC 32.9 32.0 - 36.0 g/dL   RDW 14.2 11.5 - 14.5 %   Platelets 165 150 - 440 K/uL      Assessment & Plan:   Problem List Items Addressed This Visit      Digestive   Bright red rectal bleeding    Bright red rectal bleeding will refer to GI to further evaluate unable to see other GI notes from previous colonoscopy. On chart review diverticulosis identified from CT scan in 2014. Patient will continue vitamins with iron CBC normal today.      Relevant Orders   CBC With Differential/Platelet   Urinalysis, Routine w reflex microscopic   Ambulatory referral to Gastroenterology     Other   Abdominal pain    Unknown pain in the left lower abdomen area or possibly related to GI symptoms or old prostate seed implant from prostate cancer.  Patient has follow-up appointment coming up with urology will have further evaluation there if needed.  In the meantime patient can use Tylenol and stretching exercises.  Follow up plan: Return if symptoms worsen or fail to improve, for  As scheduled.

## 2017-08-08 NOTE — Assessment & Plan Note (Signed)
Bright red rectal bleeding will refer to GI to further evaluate unable to see other GI notes from previous colonoscopy. On chart review diverticulosis identified from CT scan in 2014. Patient will continue vitamins with iron CBC normal today.

## 2017-08-08 NOTE — Assessment & Plan Note (Signed)
Unknown pain in the left lower abdomen area or possibly related to GI symptoms or old prostate seed implant from prostate cancer.  Patient has follow-up appointment coming up with urology will have further evaluation there if needed.  In the meantime patient can use Tylenol and stretching exercises.

## 2017-08-11 ENCOUNTER — Other Ambulatory Visit: Payer: Self-pay | Admitting: Family Medicine

## 2017-08-13 NOTE — Telephone Encounter (Signed)
Flexeril 10 mg refill request  LOV 03/15/17 with Merrie Roof  [facility] [place], [place]

## 2017-09-08 ENCOUNTER — Other Ambulatory Visit: Payer: Self-pay | Admitting: Family Medicine

## 2017-09-14 DIAGNOSIS — S8001XA Contusion of right knee, initial encounter: Secondary | ICD-10-CM | POA: Diagnosis not present

## 2017-09-14 DIAGNOSIS — M12861 Other specific arthropathies, not elsewhere classified, right knee: Secondary | ICD-10-CM | POA: Diagnosis not present

## 2017-09-18 ENCOUNTER — Encounter: Payer: Self-pay | Admitting: Gastroenterology

## 2017-09-18 ENCOUNTER — Ambulatory Visit: Payer: 59 | Admitting: Gastroenterology

## 2017-09-18 ENCOUNTER — Other Ambulatory Visit: Payer: Self-pay

## 2017-09-18 VITALS — BP 117/88 | HR 82 | Resp 17 | Ht 71.0 in | Wt 339.8 lb

## 2017-09-18 DIAGNOSIS — K625 Hemorrhage of anus and rectum: Secondary | ICD-10-CM

## 2017-09-18 NOTE — Progress Notes (Signed)
[name], MD [address]  [place], [place] [address]  Main: [phone]  Fax: [phone]    Gastroenterology Consultation  Referring Provider:     Guadalupe Maple, MD Primary Care Physician:  Guadalupe Maple, MD Primary Gastroenterologist:  Dr. [name] Reason for Consultation:     Rectal bleeding        HPI:   [name] is a 62 y.o. male referred by Dr. Guadalupe Maple, MD  for consultation & management of painless rectal bleeding. He noticed some bright red blood per rectum more than a month ago, stopped rivaroxaban during the rectal bleeding. He restarted after the bleeding stopped and no recurrence since then. He has history of prostate cancer with radiation treatment. He has history of A. Fib, status post ablation, on rivaroxaban given his history of metabolic syndrome. He had colonoscopy in 2015 at [facility] Report not available. He went on vacation to [facility] last week, fell and had severe swelling and bruising of his right lower extremity, went to see Dr. Sabra Heck, orthopedics, was given steroid injection, His swelling has remarkably improved since then. He is worried about rectal bleeding because a family history of colon cancer and possible radiation injury to the rectum  NSAIDs: none  Antiplts/Anticoagulants/Anti thrombotics:rivaroxaban  GI Procedures: colonoscopy in 2015 at [facility], report not available DIAGNOSIS:  A. COLON POLYP, SIGMOID; COLD BIOPSY:  -HYPERPLASTIC POLYP.  -NEGATIVE FOR DYSPLASIA AND MALIGNANCY.   B. COLON POLYP, ASCENDING; COLD BIOPSY:  -POLYPOID COLONIC MUCOSA WITH PROMINENT LYMPHOID AGGREGATE.  -NEGATIVE FOR DYSPLASIA AND MALIGNANCY.   Brother diagnosed with colon cancer and passed away at age 47 Sister With lymphoma   Past Medical History:  Diagnosis Date  . Cardiomyopathy (in setting of Afib)    a. 12/2013 Echo: EF 45-50%, mild ant and antsept HK.  mild MR. Mod dil LA. nl RV fxn. Rhythm was Afib.  Marland Kitchen DJD (degenerative joint disease) of knee   . History of kidney stones   . Intervertebral disc disorder with radiculopathy of lumbosacral region   . Kidney stone 2012  . Lower extremity edema   . PAF (paroxysmal atrial fibrillation) ([place])   . Pernicious anemia   . Prostate cancer ([place])   . Pulmonary nodule, right    a. 10/2014 Cardiac CTA: 75mm RLL nodule; b. 04/2015 CT Chest: stable 83mm RLL nodule. No new nodules; 02/2017 CTA Chest: stable, benign, 61mm RLL pulm nodule.  . SVT (supraventricular tachycardia) ([place])     Past Surgical History:  Procedure Laterality Date  . CARPAL TUNNEL RELEASE Right 12/23/2014   Procedure: CARPAL TUNNEL RELEASE;  Surgeon: Earnestine Leys, MD;  Location: [facility] ORS;  Service: Orthopedics;  Laterality: Right;  . CARPAL TUNNEL RELEASE Left 01/06/2015   Procedure: CARPAL TUNNEL RELEASE;  Surgeon: Earnestine Leys, MD;  Location: [facility] ORS;  Service: Orthopedics;  Laterality: Left;  Marland Kitchen GASTRIC BYPASS  2002  . KNEE SURGERY     knee trauma  . prostate seeding      Current Outpatient Medications:  .  amiodarone (PACERONE) 200 MG tablet, TAKE 1 TABLET(200 MG) BY MOUTH DAILY, Disp: 90 tablet, Rfl: 3 .  amLODipine (NORVASC) 5 MG tablet, TAKE 1 TABLET(5 MG) BY MOUTH DAILY, Disp: 90 tablet, Rfl: 3 .  benazepril (LOTENSIN) 40 MG tablet, TAKE 1 TABLET(40 MG) BY MOUTH DAILY, Disp: 90 tablet, Rfl: 3 .  cyanocobalamin (,VITAMIN B-12,) 1000 MCG/ML injection, Inject 1 mL (  1,000 mcg total) into the muscle every 30 (thirty) days., Disp: 10 mL, Rfl: 12 .  cyclobenzaprine (FLEXERIL) 10 MG tablet, TAKE 1 TABLET(10 MG) BY MOUTH THREE TIMES DAILY AS NEEDED FOR MUSCLE SPASMS, Disp: 60 tablet, Rfl: 0 .  ezetimibe (ZETIA) 10 MG tablet, Take 1 tablet (10 mg total) by mouth daily. (Patient taking differently: Take 10 mg by mouth at bedtime. ), Disp: 90 tablet, Rfl: 3 .  furosemide (LASIX) 20 MG tablet, Take 1 tablet (20 mg total) by mouth daily as  needed., Disp: 90 tablet, Rfl: 3 .  gabapentin (NEURONTIN) 400 MG capsule, Take 1 capsule (400 mg total) by mouth 3 (three) times daily., Disp: 60 capsule, Rfl: 3 .  metoprolol succinate (TOPROL-XL) 25 MG 24 hr tablet, TAKE 1 TABLET(25 MG) BY MOUTH DAILY (Patient taking differently: Take 25 mg by mouth every evening. ), Disp: 90 tablet, Rfl: 3 .  mirtazapine (REMERON) 15 MG tablet, Take 1 tablet (15 mg total) by mouth at bedtime. PRN Sleep, Disp: 30 tablet, Rfl: 6 .  rivaroxaban (XARELTO) 20 MG TABS tablet, Take 1 tablet (20 mg total) by mouth daily. (Patient taking differently: Take 20 mg by mouth every morning. ), Disp: 90 tablet, Rfl: 3 .  rosuvastatin (CRESTOR) 10 MG tablet, Take 1 tablet (10 mg total) by mouth at bedtime., Disp: 90 tablet, Rfl: 3 .  tamsulosin (FLOMAX) 0.4 MG CAPS capsule, tamsulosin 0.4 mg capsule  TK 2 CS DAILY UTD, Disp: , Rfl:  .  traMADol (ULTRAM) 50 MG tablet, Take 1-2 tablets (50-100 mg total) by mouth 2 (two) times daily., Disp: 120 tablet, Rfl: 5 .  traZODone (DESYREL) 50 MG tablet, , Disp: , Rfl: 0 .  amoxicillin (AMOXIL) 500 MG tablet, TK 1 T PO TID, Disp: , Rfl: 0    Family History  Problem Relation Age of Onset  . Heart disease Father   . Hypertension Father   . Stroke Father   . Cancer Father   . Arrhythmia Sister        A-fib  . Arrhythmia Brother        A-fib     Social History   Tobacco Use  . Smoking status: Former Smoker    Packs/day: 1.00    Years: 10.00    Pack years: 10.00    Types: Cigarettes    Last attempt to quit: 02/18/1979    Years since quitting: 38.6  . Smokeless tobacco: Former Systems developer    Types: Snuff  Substance Use Topics  . Alcohol use: Yes    Alcohol/week: 12.0 standard drinks    Types: 12 Cans of beer per week  . Drug use: No    Allergies as of 09/18/2017  . (No Known Allergies)    Review of Systems:    All systems reviewed and negative except where noted in HPI.   Physical Exam:  BP 117/88 (BP Location: Left Arm,  Patient Position: Sitting, Cuff Size: Large)   Pulse 82   Resp 17   Ht 5\' 11"  (1.803 m)   Wt (!) 339 lb 12.8 oz (154.1 kg)   BMI 47.39 kg/m  No LMP for male patient.  General:   Alert,  Well-developed, well-nourished, pleasant and cooperative in NAD Head:  Normocephalic and atraumatic. Eyes:  Sclera clear, no icterus.   Conjunctiva pink. Ears:  Normal auditory acuity. Nose:  No deformity, discharge, or lesions. Mouth:  No deformity or lesions,oropharynx pink & moist. Neck:  Supple; no masses or thyromegaly. Lungs:  Respirations even and unlabored.  Clear throughout to auscultation.   No wheezes, crackles, or rhonchi. No acute distress. Heart:  Regular rate and rhythm; no murmurs, clicks, rubs, or gallops. Abdomen:  Normal bowel sounds. Soft, morbidly obese, non-tender and non-distended without masses, limited abdominal exam due to body habitus.  No guarding or rebound tenderness.   Rectal: Not performed Msk:  Symmetrical without gross deformities. Good, equal movement & strength bilaterally. Pulses:  Normal pulses noted. Extremities: severe swelling and redness in right leg associated with widespread ecchymosis from recent fall  No cyanosis. Neurologic:  Alert and oriented x3;  grossly normal neurologically. Skin:  Intact without significant lesions or rashes. No jaundice. Lymph Nodes:  No significant cervical adenopathy. Psych:  Alert and cooperative. Normal mood and affect.  Imaging Studies: reviewed  Assessment and Plan:   [name] is a 62 y.o. Caucasian male with metabolic syndrome, paroxysmal A. Fib, status post ablation, on rivaroxaban, history of prostate cancer status post radiation therapy, History of gastric bypass and family history of colon cancer in first-degree relative seen in consultation for rectal bleeding. Differentials include bleeding from internal hemorrhoids or radiation proctitis or less likely malignancy.  Recommend colonoscopy for further  evaluation Discussed with him about outpatient hemorrhoid ligation after colonoscopy   Follow up based on the colonoscopy results   [name], MD

## 2017-09-20 ENCOUNTER — Other Ambulatory Visit: Payer: Self-pay | Admitting: Family Medicine

## 2017-09-21 NOTE — Telephone Encounter (Signed)
[place] refill Last Refill:02/04/17 # 90 Last OV: 07/03/17 PCP: Dr Golden Pop Pharmacy: [facility] [address] [place], [place]  Prescription d/c'd 07/03/17

## 2017-10-08 ENCOUNTER — Ambulatory Visit: Payer: 59 | Admitting: Anesthesiology

## 2017-10-08 ENCOUNTER — Telehealth: Payer: Self-pay | Admitting: Gastroenterology

## 2017-10-08 ENCOUNTER — Ambulatory Visit
Admission: RE | Admit: 2017-10-08 | Discharge: 2017-10-08 | Disposition: A | Discharge status: Home / Self Care | Payer: 59 | Source: Ambulatory Visit | Attending: Gastroenterology | Admitting: Gastroenterology

## 2017-10-08 ENCOUNTER — Encounter
Admission: RE | Disposition: A | Discharge status: Home / Self Care | Payer: Self-pay | Source: Ambulatory Visit | Attending: Gastroenterology

## 2017-10-08 ENCOUNTER — Encounter: Payer: Self-pay | Admitting: *Deleted

## 2017-10-08 DIAGNOSIS — Z7901 Long term (current) use of anticoagulants: Secondary | ICD-10-CM | POA: Diagnosis not present

## 2017-10-08 DIAGNOSIS — Z8546 Personal history of malignant neoplasm of prostate: Secondary | ICD-10-CM | POA: Insufficient documentation

## 2017-10-08 DIAGNOSIS — K644 Residual hemorrhoidal skin tags: Secondary | ICD-10-CM | POA: Diagnosis not present

## 2017-10-08 DIAGNOSIS — K635 Polyp of colon: Secondary | ICD-10-CM | POA: Diagnosis not present

## 2017-10-08 DIAGNOSIS — Z79899 Other long term (current) drug therapy: Secondary | ICD-10-CM | POA: Diagnosis not present

## 2017-10-08 DIAGNOSIS — K625 Hemorrhage of anus and rectum: Secondary | ICD-10-CM | POA: Diagnosis present

## 2017-10-08 DIAGNOSIS — K6389 Other specified diseases of intestine: Secondary | ICD-10-CM | POA: Diagnosis not present

## 2017-10-08 DIAGNOSIS — K5730 Diverticulosis of large intestine without perforation or abscess without bleeding: Secondary | ICD-10-CM | POA: Insufficient documentation

## 2017-10-08 DIAGNOSIS — I429 Cardiomyopathy, unspecified: Secondary | ICD-10-CM | POA: Insufficient documentation

## 2017-10-08 DIAGNOSIS — Z87891 Personal history of nicotine dependence: Secondary | ICD-10-CM | POA: Insufficient documentation

## 2017-10-08 DIAGNOSIS — K621 Rectal polyp: Secondary | ICD-10-CM | POA: Insufficient documentation

## 2017-10-08 DIAGNOSIS — D120 Benign neoplasm of cecum: Secondary | ICD-10-CM | POA: Diagnosis not present

## 2017-10-08 DIAGNOSIS — Z9884 Bariatric surgery status: Secondary | ICD-10-CM | POA: Insufficient documentation

## 2017-10-08 DIAGNOSIS — I480 Paroxysmal atrial fibrillation: Secondary | ICD-10-CM | POA: Insufficient documentation

## 2017-10-08 DIAGNOSIS — D122 Benign neoplasm of ascending colon: Secondary | ICD-10-CM | POA: Insufficient documentation

## 2017-10-08 DIAGNOSIS — K648 Other hemorrhoids: Secondary | ICD-10-CM | POA: Diagnosis not present

## 2017-10-08 DIAGNOSIS — Z79891 Long term (current) use of opiate analgesic: Secondary | ICD-10-CM | POA: Insufficient documentation

## 2017-10-08 DIAGNOSIS — K5790 Diverticulosis of intestine, part unspecified, without perforation or abscess without bleeding: Secondary | ICD-10-CM | POA: Diagnosis not present

## 2017-10-08 DIAGNOSIS — D510 Vitamin B12 deficiency anemia due to intrinsic factor deficiency: Secondary | ICD-10-CM | POA: Insufficient documentation

## 2017-10-08 DIAGNOSIS — D128 Benign neoplasm of rectum: Secondary | ICD-10-CM | POA: Diagnosis not present

## 2017-10-08 HISTORY — PX: COLONOSCOPY WITH PROPOFOL: SHX5780

## 2017-10-08 SURGERY — COLONOSCOPY WITH PROPOFOL
Anesthesia: General

## 2017-10-08 MED ORDER — MIDAZOLAM HCL 2 MG/2ML IJ SOLN
INTRAMUSCULAR | Status: DC | PRN
  Administered 2017-10-08: 1.5 mg via INTRAVENOUS

## 2017-10-08 MED ORDER — PROPOFOL 10 MG/ML IV BOLUS
INTRAVENOUS | Status: AC
  Filled 2017-10-08: qty 20

## 2017-10-08 MED ORDER — EPHEDRINE SULFATE 50 MG/ML IJ SOLN
INTRAMUSCULAR | Status: DC | PRN
  Administered 2017-10-08: 5 mg via INTRAVENOUS

## 2017-10-08 MED ORDER — PROPOFOL 500 MG/50ML IV EMUL
INTRAVENOUS | Status: DC | PRN
  Administered 2017-10-08: 150 ug/kg/min via INTRAVENOUS

## 2017-10-08 MED ORDER — PROPOFOL 500 MG/50ML IV EMUL
INTRAVENOUS | Status: AC
  Filled 2017-10-08: qty 50

## 2017-10-08 MED ORDER — MIDAZOLAM HCL 2 MG/2ML IJ SOLN
INTRAMUSCULAR | Status: AC
  Filled 2017-10-08: qty 2

## 2017-10-08 MED ORDER — PROPOFOL 10 MG/ML IV BOLUS
INTRAVENOUS | Status: DC | PRN
  Administered 2017-10-08: 150 mg via INTRAVENOUS

## 2017-10-08 MED ORDER — SUCCINYLCHOLINE CHLORIDE 20 MG/ML IJ SOLN
INTRAMUSCULAR | Status: AC
  Filled 2017-10-08: qty 1

## 2017-10-08 MED ORDER — FENTANYL CITRATE (PF) 100 MCG/2ML IJ SOLN
INTRAMUSCULAR | Status: AC
  Filled 2017-10-08: qty 2

## 2017-10-08 MED ORDER — SODIUM CHLORIDE 0.9 % IV SOLN
INTRAVENOUS | Status: DC
  Administered 2017-10-08 (×2): via INTRAVENOUS

## 2017-10-08 MED ORDER — FENTANYL CITRATE (PF) 100 MCG/2ML IJ SOLN
INTRAMUSCULAR | Status: DC | PRN
  Administered 2017-10-08: 50 ug via INTRAVENOUS

## 2017-10-08 NOTE — Anesthesia Preprocedure Evaluation (Signed)
Anesthesia Evaluation  Patient identified by MRN, date of birth, ID band Patient awake    Reviewed: Allergy & Precautions, H&P , NPO status , Patient's Chart, lab work & pertinent test results  History of Anesthesia Complications Negative for: history of anesthetic complications  Airway Mallampati: III  TM Distance: <3 FB Neck ROM: limited    Dental  (+) Chipped   Pulmonary neg shortness of breath, former smoker,           Cardiovascular Exercise Tolerance: Good hypertension, (-) angina(-) Past MI + dysrhythmias Atrial Fibrillation      Neuro/Psych  Neuromuscular disease negative psych ROS   GI/Hepatic negative GI ROS, Neg liver ROS, neg GERD  ,  Endo/Other  negative endocrine ROS  Renal/GU Renal disease  negative genitourinary   Musculoskeletal   Abdominal   Peds  Hematology negative hematology ROS (+)   Anesthesia Other Findings Past Medical History: No date: Cardiomyopathy (in setting of Afib)     Comment:  a. 12/2013 Echo: EF 45-50%, mild ant and antsept HK.               mild MR. Mod dil LA. nl RV fxn. Rhythm was Afib. No date: DJD (degenerative joint disease) of knee No date: History of kidney stones No date: Intervertebral disc disorder with radiculopathy of  lumbosacral region 2012: Kidney stone No date: Lower extremity edema No date: PAF (paroxysmal atrial fibrillation) (HCC) No date: Pernicious anemia No date: Prostate cancer ([place]) No date: Pulmonary nodule, right     Comment:  a. 10/2014 Cardiac CTA: 12mm RLL nodule; b. 04/2015 CT               Chest: stable 53mm RLL nodule. No new nodules; 02/2017 CTA               Chest: stable, benign, 46mm RLL pulm nodule. No date: SVT (supraventricular tachycardia) ([place])  Past Surgical History: 12/23/2014: CARPAL TUNNEL RELEASE; Right     Comment:  Procedure: CARPAL TUNNEL RELEASE;  Surgeon: Earnestine Leys, MD;  Location: [facility] ORS;  Service:  Orthopedics;                Laterality: Right; 01/06/2015: CARPAL TUNNEL RELEASE; Left     Comment:  Procedure: CARPAL TUNNEL RELEASE;  Surgeon: Earnestine Leys, MD;  Location: [facility] ORS;  Service: Orthopedics;                Laterality: Left; 2002: GASTRIC BYPASS No date: KNEE SURGERY     Comment:  knee trauma No date: prostate seeding  BMI    Body Mass Index:  44.63 kg/m      Reproductive/Obstetrics negative OB ROS                             Anesthesia Physical Anesthesia Plan  ASA: III  Anesthesia Plan: General   Post-op Pain Management:    Induction: Intravenous  PONV Risk Score and Plan: Propofol infusion and TIVA  Airway Management Planned: Natural Airway and Nasal Cannula  Additional Equipment:   Intra-op Plan:   Post-operative Plan:   Informed Consent: I have reviewed the patients History and Physical, chart, labs and discussed the procedure including the risks, benefits and alternatives for the proposed anesthesia with the patient or authorized representative who  has indicated his/her understanding and acceptance.   Dental Advisory Given  Plan Discussed with: Anesthesiologist, CRNA and Surgeon  Anesthesia Plan Comments: (Patient consented for risks of anesthesia including but not limited to:  - adverse reactions to medications - risk of intubation if required - damage to teeth, lips or other oral mucosa - sore throat or hoarseness - Damage to heart, brain, lungs or loss of life  Patient voiced understanding.)        Anesthesia Quick Evaluation

## 2017-10-08 NOTE — Anesthesia Post-op Follow-up Note (Signed)
Anesthesia QCDR form completed.        

## 2017-10-08 NOTE — Anesthesia Postprocedure Evaluation (Signed)
Anesthesia Post Note  Patient: [name]  Procedure(s) Performed: COLONOSCOPY WITH PROPOFOL (N/A )  Patient location during evaluation: Endoscopy Anesthesia Type: General Level of consciousness: awake and alert Pain management: pain level controlled Vital Signs Assessment: post-procedure vital signs reviewed and stable Respiratory status: spontaneous breathing, nonlabored ventilation, respiratory function stable and patient connected to nasal cannula oxygen Cardiovascular status: blood pressure returned to baseline and stable Postop Assessment: no apparent nausea or vomiting Anesthetic complications: no     Last Vitals:  Vitals:   10/08/17 0852 10/08/17 0922  BP: 96/71 113/89  Pulse:    Resp:    Temp: (!) 36.1 C   SpO2:      Last Pain:  Vitals:   10/08/17 0902  TempSrc:   PainSc: 0-No pain                 [name] Merdith Boyd

## 2017-10-08 NOTE — Telephone Encounter (Signed)
-----   Message from Lin Landsman, MD sent at 10/08/2017  8:53 AM EDT ----- Regarding: to hemorrhoid clinic Schedule this week or next for banding  RV

## 2017-10-08 NOTE — Transfer of Care (Signed)
Immediate Anesthesia Transfer of Care Note  Patient: [name]  Procedure(s) Performed: COLONOSCOPY WITH PROPOFOL (N/A )  Patient Location: PACU  Anesthesia Type:General  Level of Consciousness: sedated  Airway & Oxygen Therapy: Patient Spontanous Breathing and Patient connected to nasal cannula oxygen  Post-op Assessment: Report given to RN and Post -op Vital signs reviewed and stable  Post vital signs: Reviewed and stable  Last Vitals:  Vitals Value Taken Time  BP 96/71 10/08/2017  8:54 AM  Temp 36.1 C 10/08/2017  8:52 AM  Pulse 81 10/08/2017  8:55 AM  Resp 12 10/08/2017  8:55 AM  SpO2 93 % 10/08/2017  8:55 AM  Vitals shown include unvalidated device data.  Last Pain:  Vitals:   10/08/17 0852  TempSrc: Tympanic  PainSc:          Complications: No apparent anesthesia complications

## 2017-10-08 NOTE — Anesthesia Procedure Notes (Signed)
Date/Time: 10/08/2017 8:20 AM Performed by: Allean Found, CRNA Pre-anesthesia Checklist: Patient identified, Emergency Drugs available, Suction available, Patient being monitored and Timeout performed Patient Re-evaluated:Patient Re-evaluated prior to induction Oxygen Delivery Method: Nasal cannula Placement Confirmation: positive ETCO2

## 2017-10-08 NOTE — Telephone Encounter (Signed)
LEFT VM FOR PT TO CALL OFFICE AND SCHEDULE FU FOR BANDING

## 2017-10-08 NOTE — H&P (Signed)
Wayne Darby, MD [address]  [place]  [place], [place] [address]  Main: [phone]  Fax: [phone] Pager: [phone]  Primary Care Physician:  Guadalupe Maple, MD Primary Gastroenterologist:  Dr. Cephas Hill  Pre-Procedure History & Physical: HPI:  [name] is a 62 y.o. male is here for an colonoscopy.   Past Medical History:  Diagnosis Date  . Cardiomyopathy (in setting of Afib)    a. 12/2013 Echo: EF 45-50%, mild ant and antsept HK. mild MR. Mod dil LA. nl RV fxn. Rhythm was Afib.  Marland Kitchen DJD (degenerative joint disease) of knee   . History of kidney stones   . Intervertebral disc disorder with radiculopathy of lumbosacral region   . Kidney stone 2012  . Lower extremity edema   . PAF (paroxysmal atrial fibrillation) ([place])   . Pernicious anemia   . Prostate cancer ([place])   . Pulmonary nodule, right    a. 10/2014 Cardiac CTA: 26mm RLL nodule; b. 04/2015 CT Chest: stable 93mm RLL nodule. No new nodules; 02/2017 CTA Chest: stable, benign, 10mm RLL pulm nodule.  . SVT (supraventricular tachycardia) ([place])     Past Surgical History:  Procedure Laterality Date  . CARPAL TUNNEL RELEASE Right 12/23/2014   Procedure: CARPAL TUNNEL RELEASE;  Surgeon: Earnestine Leys, MD;  Location: [facility] ORS;  Service: Orthopedics;  Laterality: Right;  . CARPAL TUNNEL RELEASE Left 01/06/2015   Procedure: CARPAL TUNNEL RELEASE;  Surgeon: Earnestine Leys, MD;  Location: [facility] ORS;  Service: Orthopedics;  Laterality: Left;  Marland Kitchen GASTRIC BYPASS  2002  . KNEE SURGERY     knee trauma  . prostate seeding      Prior to Admission medications   Medication Sig Start Date End Date Taking? Authorizing Provider  amiodarone (PACERONE) 200 MG tablet TAKE 1 TABLET(200 MG) BY MOUTH DAILY 02/19/17  Yes Gollan, Kathlene November, MD  amLODipine (NORVASC) 5 MG tablet TAKE 1 TABLET(5 MG) BY MOUTH DAILY 02/19/17  Yes Gollan, Kathlene November, MD  benazepril (LOTENSIN) 40 MG tablet TAKE 1 TABLET(40 MG) BY MOUTH DAILY 02/19/17  Yes  Gollan, Kathlene November, MD  ezetimibe (ZETIA) 10 MG tablet Take 1 tablet (10 mg total) by mouth daily. Patient taking differently: Take 10 mg by mouth at bedtime.  02/19/17  Yes Gollan, Kathlene November, MD  furosemide (LASIX) 20 MG tablet Take 1 tablet (20 mg total) by mouth daily as needed. 02/19/17  Yes Gollan, Kathlene November, MD  gabapentin (NEURONTIN) 400 MG capsule Take 1 capsule (400 mg total) by mouth 3 (three) times daily. 12/23/14  Yes Earnestine Leys, MD  metoprolol succinate (TOPROL-XL) 25 MG 24 hr tablet TAKE 1 TABLET(25 MG) BY MOUTH DAILY Patient taking differently: Take 25 mg by mouth every evening.  02/19/17  Yes Minna Merritts, MD  rosuvastatin (CRESTOR) 10 MG tablet Take 1 tablet (10 mg total) by mouth at bedtime. 02/19/17  Yes Gollan, Kathlene November, MD  traMADol (ULTRAM) 50 MG tablet Take 1-2 tablets (50-100 mg total) by mouth 2 (two) times daily. 07/03/17  Yes Guadalupe Maple, MD  traZODone (DESYREL) 50 MG tablet  08/11/17  Yes [provider]  traZODone (DESYREL) 50 MG tablet TAKE 1 TABLET BY MOUTH EVERY NIGHT AT BEDTIME AS NEEDED FOR INSOMNIA 09/21/17  Yes Crissman, Jeannette How, MD  amoxicillin (AMOXIL) 500 MG tablet TK 1 T PO TID 07/22/17   [provider]  cyanocobalamin (,VITAMIN B-12,) 1000 MCG/ML injection Inject 1 mL (1,000 mcg total) into the muscle every  30 (thirty) days. 07/03/17   Guadalupe Maple, MD  cyclobenzaprine (FLEXERIL) 10 MG tablet TAKE 1 TABLET(10 MG) BY MOUTH THREE TIMES DAILY AS NEEDED FOR MUSCLE SPASMS 08/13/17   Volney American, PA-C  mirtazapine (REMERON) 15 MG tablet Take 1 tablet (15 mg total) by mouth at bedtime. PRN Sleep 07/03/17   Guadalupe Maple, MD  rivaroxaban (XARELTO) 20 MG TABS tablet Take 1 tablet (20 mg total) by mouth daily. Patient taking differently: Take 20 mg by mouth every morning.  02/19/17   Minna Merritts, MD  tamsulosin (FLOMAX) 0.4 MG CAPS capsule tamsulosin 0.4 mg capsule  TK 2 CS DAILY UTD    [provider]    Allergies as of  09/18/2017  . (No Known Allergies)    Family History  Problem Relation Age of Onset  . Heart disease Father   . Hypertension Father   . Stroke Father   . Cancer Father   . Arrhythmia Sister        A-fib  . Arrhythmia Brother        A-fib    Social History   Socioeconomic History  . Marital status: Married    Spouse name: Not on file  . Number of children: Not on file  . Years of education: Not on file  . Highest education level: Not on file  Occupational History  . Not on file  Social Needs  . Financial resource strain: Not on file  . Food insecurity:    Worry: Not on file    Inability: Not on file  . Transportation needs:    Medical: Not on file    Non-medical: Not on file  Tobacco Use  . Smoking status: Former Smoker    Packs/day: 1.00    Years: 10.00    Pack years: 10.00    Types: Cigarettes    Last attempt to quit: 02/18/1979    Years since quitting: 38.6  . Smokeless tobacco: Former Systems developer    Types: Snuff  Substance and Sexual Activity  . Alcohol use: Yes    Alcohol/week: 12.0 standard drinks    Types: 12 Cans of beer per week  . Drug use: No  . Sexual activity: Not on file  Lifestyle  . Physical activity:    Days per week: Not on file    Minutes per session: Not on file  . Stress: Not on file  Relationships  . Social connections:    Talks on phone: Not on file    Gets together: Not on file    Attends religious service: Not on file    Active member of club or organization: Not on file    Attends meetings of clubs or organizations: Not on file    Relationship status: Not on file  . Intimate partner violence:    Fear of current or ex partner: Not on file    Emotionally abused: Not on file    Physically abused: Not on file    Forced sexual activity: Not on file  Other Topics Concern  . Not on file  Social History Narrative  . Not on file    Review of Systems: See HPI, otherwise negative ROS  Physical Exam: BP (!) 152/103   Pulse 91   Temp  (!) 97 F (36.1 C) (Tympanic)   Resp 16   Wt (!) 145.2 kg   SpO2 97%   BMI 44.63 kg/m  General:   Alert,  pleasant and cooperative in NAD Head:  Normocephalic and atraumatic. Neck:  Supple; no masses or thyromegaly. Lungs:  Clear throughout to auscultation.    Heart:  Regular rate and rhythm. Abdomen:  Soft, nontender and nondistended. Normal bowel sounds, without guarding, and without rebound.   Neurologic:  Alert and  oriented x4;  grossly normal neurologically.  Impression/Plan: [name] is here for an colonoscopy to be performed for rectal bleeding  Risks, benefits, limitations, and alternatives regarding  colonoscopy have been reviewed with the patient.  Questions have been answered.  All parties agreeable.   Sherri Sear, MD  10/08/2017, 8:15 AM

## 2017-10-08 NOTE — Op Note (Signed)
[facility] Gastroenterology Patient Name: [name] Procedure Date: 10/08/2017 7:24 AM MRN: [id number] Account #: [id number] Date of Birth: 1955-02-18 Admit Type: Outpatient Age: 62 Room: [facility] ENDO ROOM 3 Gender: Male Note Status: Finalized Procedure:            Colonoscopy Indications:          Rectal bleeding Providers:            Lin Landsman MD, MD Referring MD:         Guadalupe Maple, MD (Referring MD) Medicines:            Monitored Anesthesia Care Complications:        No immediate complications. Estimated blood loss: None. Procedure:            Pre-Anesthesia Assessment:                       - Prior to the procedure, a History and Physical was                        performed, and patient medications and allergies were                        reviewed. The patient is competent. The risks and                        benefits of the procedure and the sedation options and                        risks were discussed with the patient. All questions                        were answered and informed consent was obtained.                        Patient identification and proposed procedure were                        verified by the physician, the nurse, the                        anesthesiologist, the anesthetist and the technician in                        the pre-procedure area in the procedure room in the                        endoscopy suite. Mental Status Examination: alert and                        oriented. Airway Examination: normal oropharyngeal                        airway and neck mobility. Respiratory Examination:                        clear to auscultation. CV Examination: normal.                        Prophylactic Antibiotics: The patient does not require  prophylactic antibiotics. Prior Anticoagulants: The                        patient has taken no previous anticoagulant or                        antiplatelet  agents. ASA Grade Assessment: III - A                        patient with severe systemic disease. After reviewing                        the risks and benefits, the patient was deemed in                        satisfactory condition to undergo the procedure. The                        anesthesia plan was to use monitored anesthesia care                        (MAC). Immediately prior to administration of                        medications, the patient was re-assessed for adequacy                        to receive sedatives. The heart rate, respiratory rate,                        oxygen saturations, blood pressure, adequacy of                        pulmonary ventilation, and response to care were                        monitored throughout the procedure. The physical status                        of the patient was re-assessed after the procedure.                       After obtaining informed consent, the colonoscope was                        passed under direct vision. Throughout the procedure,                        the patient's blood pressure, pulse, and oxygen                        saturations were monitored continuously. The                        Colonoscope was introduced through the anus and                        advanced to the the cecum, identified by appendiceal  orifice and ileocecal valve. The colonoscopy was                        somewhat difficult due to significant looping and the                        patient's body habitus. Successful completion of the                        procedure was aided by applying abdominal pressure. The                        patient tolerated the procedure well. The quality of                        the bowel preparation was evaluated using the BBPS                        [facility] Bowel Preparation Scale) with scores of: Right                        Colon = 3, Transverse Colon = 3 and Left Colon = 3                         (entire mucosa seen well with no residual staining,                        small fragments of stool or opaque liquid). The total                        BBPS score equals 9. Findings:      The perianal and digital rectal examinations were normal. Pertinent       negatives include normal sphincter tone and no palpable rectal lesions.      Four sessile polyps were found in the rectum, ascending colon and cecum.       The polyps were diminutive in size. These polyps were removed with a       cold biopsy forceps. Resection and retrieval were complete.      A 5 mm polyp was found in the ascending colon. The polyp was sessile.       The polyp was removed with a cold snare. Resection and retrieval were       complete.      Multiple diverticula were found in the sigmoid colon and ascending       colon. There was no evidence of diverticular bleeding.      External hemorrhoids were found during retroflexion. The hemorrhoids       were large. Impression:           - Four diminutive polyps in the rectum, in the                        ascending colon and in the cecum, removed with a cold                        biopsy forceps. Resected and retrieved.                       - One 5 mm polyp  in the ascending colon, removed with a                        cold snare. Resected and retrieved.                       - Severe diverticulosis in the sigmoid colon and in the                        ascending colon. There was no evidence of diverticular                        bleeding.                       - External hemorrhoids. Recommendation:       - Discharge patient to home (with spouse).                       - Cardiac diet today.                       - Continue present medications.                       - Await pathology results.                       - Resume Xarelto (rivaroxaban) today at prior dose.                        Refer to managing physician for further adjustment of                         therapy.                       - Repeat colonoscopy in 3 - 5 years for surveillance of                        multiple polyps.                       - Return to my office in 1 week for hemorrhoid ligation. Procedure Code(s):    --- Professional ---                       [phone], Colonoscopy, flexible; with removal of tumor(s),                        polyp(s), or other lesion(s) by snare technique                       45380, 71, Colonoscopy, flexible; with biopsy, single                        or multiple Diagnosis Code(s):    --- Professional ---                       K62.1, Rectal polyp                       D12.2, Benign neoplasm of ascending colon  D12.0, Benign neoplasm of cecum                       K64.4, Residual hemorrhoidal skin tags                       K62.5, Hemorrhage of anus and rectum                       K57.30, Diverticulosis of large intestine without                        perforation or abscess without bleeding CPT copyright 2017 American Medical Association. All rights reserved. The codes documented in this report are preliminary and upon coder review may  be revised to meet current compliance requirements. Dr. Ulyess Mort Lin Landsman MD, MD 10/08/2017 8:51:26 AM This report has been signed electronically. Number of Addenda: 0 Note Initiated On: 10/08/2017 7:24 AM Scope Withdrawal Time: 0 hours 17 minutes 18 seconds  Total Procedure Duration: 0 hours 23 minutes 48 seconds       [facility]

## 2017-10-10 LAB — SURGICAL PATHOLOGY

## 2017-10-11 ENCOUNTER — Telehealth: Payer: Self-pay | Admitting: Gastroenterology

## 2017-10-11 NOTE — Telephone Encounter (Signed)
Spoke with pt to schedule for hemorroids banding he states he is doing research on that and will call us back to schedule he would like to know if the results came back from his polyp removal please call pt

## 2017-10-11 NOTE — Telephone Encounter (Signed)
Patient has been notified of results from pathology report and verbalized understanding

## 2017-10-12 ENCOUNTER — Encounter: Payer: Self-pay | Admitting: Gastroenterology

## 2017-10-18 ENCOUNTER — Telehealth: Payer: Self-pay | Admitting: Cardiovascular Disease

## 2017-10-18 NOTE — Telephone Encounter (Signed)
Spoke with patient and he said yesterday he was in a meeting at work and he started feeling funny and started sweating, shaky, weak, heart rate was up, app on phone was showing elevated and danger heart rate. He said that his heart rates have been between 57-114 and he does have history of afib. Reviewed that palpitations and/or afib can be very erratic on phone apps and therefore heart rates can vary but given his symptoms he may want to come in to be seen sooner. Offered him appointment next week with Ignacia Bayley NP on 10/24/17 at 2:30 pm. He was agreeable with this date, time, and provider. Reviewed signs and symptoms which he should monitor for and reviewed when he should seek evaluation in the ED. He verbalized understanding of our conversation, agreement with plan, and had no further questions at this time.

## 2017-10-18 NOTE — Telephone Encounter (Signed)
Patient c/o Palpitations:  High priority if patient c/o lightheadedness, shortness of breath, or chest pain  1) How long have you had palpitations/irregular HR/ Afib? Are you having the symptoms now? Started yesterday at 1015 am  .  Yes this morning   2) Are you currently experiencing lightheadedness, SOB or CP? Not currently but does c/o episodes of lightheadedness and shaky panic like feeling   3) Do you have a history of afib (atrial fibrillation) or irregular heart rhythm? Yes   4) Have you checked your BP or HR? (document readings if available):  No bp readings HR as low as 50's up as high as 110's   5) Are you experiencing any other symptoms? Sweating  Weakness    Patient had these symptoms before with Afib and states he almost called an ambulance yesterday

## 2017-10-24 ENCOUNTER — Ambulatory Visit: Payer: 59 | Admitting: Nurse Practitioner

## 2017-11-04 ENCOUNTER — Other Ambulatory Visit: Payer: Self-pay | Admitting: Family Medicine

## 2017-11-04 DIAGNOSIS — M1731 Unilateral post-traumatic osteoarthritis, right knee: Secondary | ICD-10-CM

## 2017-11-05 ENCOUNTER — Other Ambulatory Visit: Payer: Self-pay | Admitting: Family Medicine

## 2017-11-05 DIAGNOSIS — M1731 Unilateral post-traumatic osteoarthritis, right knee: Secondary | ICD-10-CM

## 2017-11-05 MED ORDER — TRAMADOL HCL 50 MG PO TABS: 50.0000 mg | ORAL_TABLET | Freq: Two times a day (BID) | ORAL | 5 refills | Status: DC

## 2017-11-05 NOTE — Telephone Encounter (Signed)
Requested medication (s) are due for refill today: yes  Requested medication (s) are on the active medication list: yes    Last refill: 07/03/17  Future visit scheduled yes  Notes to clinic:  Requested Prescriptions  Pending Prescriptions Disp Refills   traMADol (ULTRAM) 50 MG tablet [Pharmacy Med Name: TRAMADOL 50MG  TABLETS] 300 tablet 0    Sig: TAKE 1 TABLET BY MOUTH EVERY 6 HOURS AS NEEDED     Not Delegated - Analgesics:  Opioid Agonists Failed - 11/04/2017  9:26 AM      Failed - This refill cannot be delegated      Failed - Urine Drug Screen completed in last 360 days.      Passed - Valid encounter within last 6 months    Recent Outpatient Visits          2 months ago Bright red rectal bleeding   [facility] Jeananne Rama, Jeannette How, MD   4 months ago Paroxysmal atrial fibrillation [facility])   [facility] Crissman, Jeannette How, MD   7 months ago Chest pain, unspecified type   [facility] Merrie Roof [place], [place]   11 months ago Essential hypertension   [facility] Crissman, Jeannette How, MD   1 year ago Annual physical exam   [facility] Crissman, Jeannette How, MD      Future Appointments            In 1 month Crissman, Jeannette How, MD [facility]

## 2017-11-06 DIAGNOSIS — Z23 Encounter for immunization: Secondary | ICD-10-CM | POA: Diagnosis not present
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
## 2017-11-30 ENCOUNTER — Encounter: Payer: Self-pay | Admitting: Cardiovascular Disease

## 2017-11-30 ENCOUNTER — Ambulatory Visit (INDEPENDENT_AMBULATORY_CARE_PROVIDER_SITE_OTHER): Payer: 59 | Admitting: Cardiovascular Disease

## 2017-11-30 ENCOUNTER — Telehealth: Payer: Self-pay | Admitting: Cardiovascular Disease

## 2017-11-30 VITALS — BP 140/82 | HR 64 | Ht 72.0 in | Wt 347.5 lb

## 2017-11-30 DIAGNOSIS — I480 Paroxysmal atrial fibrillation: Secondary | ICD-10-CM

## 2017-11-30 DIAGNOSIS — M179 Osteoarthritis of knee, unspecified: Secondary | ICD-10-CM | POA: Insufficient documentation

## 2017-11-30 DIAGNOSIS — E782 Mixed hyperlipidemia: Secondary | ICD-10-CM

## 2017-11-30 DIAGNOSIS — I10 Essential (primary) hypertension: Secondary | ICD-10-CM | POA: Diagnosis not present

## 2017-11-30 DIAGNOSIS — M1711 Unilateral primary osteoarthritis, right knee: Secondary | ICD-10-CM | POA: Diagnosis not present

## 2017-11-30 DIAGNOSIS — G5602 Carpal tunnel syndrome, left upper limb: Secondary | ICD-10-CM | POA: Diagnosis not present

## 2017-11-30 DIAGNOSIS — M19031 Primary osteoarthritis, right wrist: Secondary | ICD-10-CM | POA: Diagnosis not present

## 2017-11-30 DIAGNOSIS — M19039 Primary osteoarthritis, unspecified wrist: Secondary | ICD-10-CM | POA: Insufficient documentation

## 2017-11-30 NOTE — Patient Instructions (Signed)
Medication Instructions:  No changes  Take extra amiodarone and metoprolol for breakthrough atrial fibrillation  If you need a refill on your cardiac medications before your next appointment, please call your pharmacy.    Lab work: No new labs needed   If you have labs (blood work) drawn today and your tests are completely normal, you will receive your results only by: Marland Kitchen MyChart Message (if you have MyChart) OR . A paper copy in the mail If you have any lab test that is abnormal or we need to change your treatment, we will call you to review the results.   Testing/Procedures: No new testing needed   Follow-Up: At [facility], you and your health needs are our priority.  As part of our continuing mission to provide you with exceptional heart care, we have created designated Provider Care Teams.  These Care Teams include your primary Cardiologist (physician) and Advanced Practice Providers (APPs -  Physician Assistants and Nurse Practitioners) who all work together to provide you with the care you need, when you need it.  . You will need a follow up appointment in 12 months .   Please call our office 2 months in advance to schedule this appointment.    . Providers on your designated Care Team:   . Murray Hodgkins, NP . Christell Faith, PA-C . Marrianne Mood, PA-C  Any Other Special Instructions Will Be Listed Below (If Applicable).  For educational health videos Log in to : [email or web address] Or : [email or web address], password : [email or web address]

## 2017-11-30 NOTE — Telephone Encounter (Signed)
Patient calling stating he forgot to ask Dr Rockey Situ this morning  But he is needing to know if he is allowed to take an Inflammatories  He states he is going to an Orthopedic doctor (Dr Sabra Heck at [facility])  for shots for his knee pains along with arm and wrist pains He states any kind of inflammatory would help   Please call back

## 2017-12-02 ENCOUNTER — Other Ambulatory Visit: Payer: Self-pay | Admitting: Family Medicine

## 2017-12-02 NOTE — Telephone Encounter (Signed)
On the Xarelto he should try to avoid NSAIDs Potentially could take Celebrex sparingly Would discuss with orthopedic

## 2017-12-03 NOTE — Telephone Encounter (Signed)
Requested medication (s) are due for refill today: yes  Requested medication (s) are on the active medication list: yes  Last refill:  08/13/17 for 60 tabs  Future visit scheduled: yes  Notes to clinic:  Med can not be delegated.  Requested Prescriptions  Pending Prescriptions Disp Refills   cyclobenzaprine (FLEXERIL) 10 MG tablet [Pharmacy Med Name: CYCLOBENZAPRINE 10MG  TABLETS] 60 tablet 0    Sig: TAKE 1 TABLET(10 MG) BY MOUTH THREE TIMES DAILY AS NEEDED FOR MUSCLE SPASMS     Not Delegated - Analgesics:  Muscle Relaxants Failed - 12/02/2017  9:42 AM      Failed - This refill cannot be delegated      Passed - Valid encounter within last 6 months    Recent Outpatient Visits          3 months ago Bright red rectal bleeding   [facility] Guadalupe Maple, MD   5 months ago Paroxysmal atrial fibrillation [facility])   [place] Crissman, Jeannette How, MD   8 months ago Chest pain, unspecified type   [facility] Merrie Roof [place], [place]   12 months ago Essential hypertension   [facility] Crissman, Jeannette How, MD   1 year ago Annual physical exam   [place] Crissman, Jeannette How, MD      Future Appointments            In 1 month Crissman, Jeannette How, MD [facility], [facility]

## 2017-12-03 NOTE — Telephone Encounter (Signed)
Spoke with patient and reviewed providers recommendations to avoid NSAIDS while taking Xarelto for his atrial fibrillation. Patient expressed that his pain is so bad that he is tempted to stop the Xarelto so he can have some relief from pain. Reviewed the risks associated with stopping the Xarelto. Recommended that he discuss his pain with the orthopaedic physician and some options without NSAIDS. Will route our message and recommendations over to his provider for further review.

## 2017-12-03 NOTE — Telephone Encounter (Signed)
Phone encounter routed over to Dr. Delman Cheadle office via [place]

## 2017-12-16 ENCOUNTER — Other Ambulatory Visit: Payer: Self-pay | Admitting: Family Medicine

## 2017-12-17 NOTE — Telephone Encounter (Signed)
Requested medication (s) are due for refill today: Yes  Requested medication (s) are on the active medication list: Yes  Last refill:  08/11/17 by historical provider  Future visit scheduled: Yes  Notes to clinic:  Unable to refill per protocol, last refilled by historical provider     Requested Prescriptions  Pending Prescriptions Disp Refills   traZODone (DESYREL) 50 MG tablet [Pharmacy Med Name: TRAZODONE 50MG  TABLETS] 90 tablet 0    Sig: TAKE 1 TABLET BY MOUTH EVERY NIGHT AT BEDTIME AS NEEDED FOR INSOMNIA     Psychiatry: Antidepressants - Serotonin Modulator Passed - 12/16/2017 10:28 PM      Passed - Valid encounter within last 6 months    Recent Outpatient Visits          4 months ago Bright red rectal bleeding   [facility] Guadalupe Maple, MD   5 months ago Paroxysmal atrial fibrillation [facility])   [place] Crissman, Jeannette How, MD   9 months ago Chest pain, unspecified type   [facility] Volney American, [place]   1 year ago Essential hypertension   [facility] Crissman, Jeannette How, MD   1 year ago Annual physical exam   [place] Crissman, Jeannette How, MD      Future Appointments            In 2 weeks Crissman, Jeannette How, MD [facility], [facility]

## 2018-01-02 ENCOUNTER — Encounter: Payer: Self-pay | Admitting: Family Medicine

## 2018-01-02 ENCOUNTER — Ambulatory Visit: Payer: 59 | Admitting: Family Medicine

## 2018-01-02 VITALS — BP 123/73 | HR 67 | Temp 97.9°F | Ht 72.0 in | Wt 346.9 lb

## 2018-01-02 DIAGNOSIS — I10 Essential (primary) hypertension: Secondary | ICD-10-CM

## 2018-01-02 DIAGNOSIS — I480 Paroxysmal atrial fibrillation: Secondary | ICD-10-CM | POA: Diagnosis not present

## 2018-01-02 DIAGNOSIS — K625 Hemorrhage of anus and rectum: Secondary | ICD-10-CM | POA: Diagnosis not present

## 2018-01-02 DIAGNOSIS — D649 Anemia, unspecified: Secondary | ICD-10-CM

## 2018-01-02 NOTE — Assessment & Plan Note (Signed)
Discussed weight loss. 

## 2018-01-02 NOTE — Progress Notes (Signed)
BP 123/73 (BP Location: Left Arm, Patient Position: Sitting, Cuff Size: Large)   Pulse 67   Temp 97.9 F (36.6 C) (Oral)   Ht 6' (1.829 m)   Wt (!) 346 lb 14.4 oz (157.4 kg)   SpO2 95%   BMI 47.05 kg/m    Subjective:    Patient ID: [name], male    DOB: 1955/04/25, 62 y.o.   MRN: [id number]  HPI: [name] is a 63 y.o. male  Chief Complaint  Patient presents with  . Hypertension    6 month F/U  . Anemia  Patient follow-up hypertension has been doing well taking medications faithfully without problems good control. Had episode of atrial fibrillation been followed by cardiology and getting good reports to monitor his amiodarone cardiologist recommends checking liver function and thyroid. Patient also follow-up anemia has been stable with no further bleeding episodes will check CBC today  Relevant past medical, surgical, family and social history reviewed and updated as indicated. Interim medical history since our last visit reviewed. Allergies and medications reviewed and updated.  Review of Systems  Constitutional: Negative.   Respiratory: Negative.   Cardiovascular: Negative.     Per HPI unless specifically indicated above     Objective:    BP 123/73 (BP Location: Left Arm, Patient Position: Sitting, Cuff Size: Large)   Pulse 67   Temp 97.9 F (36.6 C) (Oral)   Ht 6' (1.829 m)   Wt (!) 346 lb 14.4 oz (157.4 kg)   SpO2 95%   BMI 47.05 kg/m   Wt Readings from Last 3 Encounters:  01/02/18 (!) 346 lb 14.4 oz (157.4 kg)  11/30/17 (!) 347 lb 8 oz (157.6 kg)  10/08/17 (!) 320 lb (145.2 kg)    Physical Exam Constitutional:      Appearance: He is well-developed.  HENT:     Head: Normocephalic and atraumatic.  Eyes:     Conjunctiva/sclera: Conjunctivae normal.  Neck:     Musculoskeletal: Normal range of motion.  Cardiovascular:     Rate and Rhythm: Normal rate and regular rhythm.     Heart sounds: Normal heart sounds.  Pulmonary:     Effort:  Pulmonary effort is normal.     Breath sounds: Normal breath sounds.  Musculoskeletal: Normal range of motion.  Skin:    Findings: No erythema.  Neurological:     Mental Status: He is alert and oriented to person, place, and time.  Psychiatric:        Behavior: Behavior normal.        Thought Content: Thought content normal.        Judgment: Judgment normal.     Results for orders placed or performed during the hospital encounter of 10/08/17  Surgical pathology  Result Value Ref Range   SURGICAL PATHOLOGY      Surgical Pathology CASE: [id number] PATIENT: [name] Surgical Pathology Report     SPECIMEN SUBMITTED: A. Colon polyp x2, cecum; cbx B. Colon polyp, ascending; cold snare C. Colon polyp, transverse; cbx D. Rectum polyp; cbx  CLINICAL HISTORY: None provided  PRE-OPERATIVE DIAGNOSIS: Rectal bleeding K62.5  POST-OPERATIVE DIAGNOSIS: Colon polyps, diverticulosis, internal hemorrhoids     DIAGNOSIS: A.  COLON POLYP X 2, CECUM; COLD BIOPSY: - TUBULAR ADENOMAS, 2 FRAGMENTS. - NEGATIVE FOR HIGH-GRADE DYSPLASIA AND MALIGNANCY.  B.  COLON POLYP, ASCENDING; COLD SNARE: - TUBULAR ADENOMA. - NEGATIVE FOR HIGH-GRADE DYSPLASIA AND MALIGNANCY.  C.  COLON POLYP, TRANSVERSE; COLD BIOPSY: - CRYPT HYPERPLASIA  AND LYMPHOID AGGREGATE. - NEGATIVE FOR DYSPLASIA AND MALIGNANCY.  D.  RECTUM POLYP; COLD BIOPSY: - HYPERPLASTIC POLYP. - NEGATIVE FOR DYSPLASIA AND MALIGNANCY.   GROSS DESCRIPTION: A. Labeled: Cecum polyp C BX x2 Received: Formalin Tissue fragment( s): 2 Size: 0.3-0.4 cm Description: Tan soft tissue fragments Entirely submitted in one cassette.  B. Labeled: Ascending colon polyp cold snare Received: Formalin Tissue fragment(s): Multiple Size: Aggregate, 0.4 x 0.2 x 0.1 cm Description: Tan soft tissue fragments admixed with fecal material Entirely submitted in one cassette.  C. Labeled: Transverse colon polyp C BX Received: Formalin Tissue  fragment(s): 1 Size: 0.4 cm Description: Tan soft tissue fragment Entirely submitted in one cassette.  D. Labeled: Rectum polyp C BX Received: Formalin Tissue fragment(s): 1 Size: 0.3 cm Description: Tan soft tissue fragment Entirely submitted in one cassette.   Final Diagnosis performed by Bryan Lemma, MD.   Electronically signed 10/10/2017 6:31:38PM The electronic signature indicates that the named Attending Pathologist has evaluated the specimen  Technical component performed at [facility], [address], [place], [place] [address] Lab: [phone] Dir: Rush Farmer , MD, MMM  Professional component performed at [facility], [facility], [place], [place], [place] [address] Lab: [phone] Dir: Dellia Nims. Reuel Derby, MD       Assessment & Plan:   Problem List Items Addressed This Visit      Cardiovascular and Mediastinum   Essential hypertension    The current medical regimen is effective;  continue present plan and medications.       Relevant Orders   TSH   Basic metabolic panel   Paroxysmal atrial fibrillation (HCC)    The current medical regimen is effective;  continue present plan and medications. Check labs      Relevant Orders   TSH   ALT   AST     Digestive   Bright red rectal bleeding    Resolved will check CBC        Other   Morbid obesity ([place])    Discussed weight loss       Other Visit Diagnoses    Anemia, unspecified type    -  Primary   Relevant Orders   CBC with Differential/Platelet       Follow up plan: Return in about 6 months (around 07/04/2018) for Physical Exam.

## 2018-01-02 NOTE — Assessment & Plan Note (Addendum)
The current medical regimen is effective;  continue present plan and medications. Check labs. 

## 2018-01-02 NOTE — Assessment & Plan Note (Signed)
The current medical regimen is effective;  continue present plan and medications.  

## 2018-01-02 NOTE — Assessment & Plan Note (Signed)
Resolved will check CBC

## 2018-01-03 LAB — CBC WITH DIFFERENTIAL/PLATELET
BASOS ABS: 0 10*3/uL (ref 0.0–0.2)
Basos: 1 %
EOS (ABSOLUTE): 0.1 10*3/uL (ref 0.0–0.4)
Eos: 1 %
Hematocrit: 39.7 % (ref 37.5–51.0)
Hemoglobin: 13.1 g/dL (ref 13.0–17.7)
Immature Grans (Abs): 0 10*3/uL (ref 0.0–0.1)
Immature Granulocytes: 0 %
Lymphocytes Absolute: 2.3 10*3/uL (ref 0.7–3.1)
Lymphs: 29 %
MCH: 26 pg — AB (ref 26.6–33.0)
MCHC: 33 g/dL (ref 31.5–35.7)
MCV: 79 fL (ref 79–97)
MONOS ABS: 0.9 10*3/uL (ref 0.1–0.9)
Monocytes: 11 %
NEUTROS ABS: 4.8 10*3/uL (ref 1.4–7.0)
Neutrophils: 58 %
Platelets: 283 10*3/uL (ref 150–450)
RBC: 5.03 x10E6/uL (ref 4.14–5.80)
RDW: 14.4 % (ref 12.3–15.4)
WBC: 8.1 10*3/uL (ref 3.4–10.8)

## 2018-01-03 LAB — BASIC METABOLIC PANEL
BUN / CREAT RATIO: 14 (ref 10–24)
BUN: 15 mg/dL (ref 8–27)
CHLORIDE: 106 mmol/L (ref 96–106)
CO2: 24 mmol/L (ref 20–29)
Calcium: 9 mg/dL (ref 8.6–10.2)
Creatinine, Ser: 1.09 mg/dL (ref 0.76–1.27)
GFR calc non Af Amer: 71 mL/min/{1.73_m2} (ref >59)
GFR, EST AFRICAN AMERICAN: 82 mL/min/{1.73_m2} (ref >59)
GLUCOSE: 101 mg/dL — AB (ref 65–99)
POTASSIUM: 4.8 mmol/L (ref 3.5–5.2)
SODIUM: 143 mmol/L (ref 134–144)

## 2018-01-03 LAB — TSH: TSH: 1.9 u[IU]/mL (ref 0.450–4.500)

## 2018-01-03 LAB — AST: AST: 14 IU/L (ref 0–40)

## 2018-01-03 LAB — ALT: ALT: 15 IU/L (ref 0–44)

## 2018-01-11 ENCOUNTER — Telehealth: Payer: Self-pay | Admitting: Gastroenterology

## 2018-01-11 NOTE — Telephone Encounter (Signed)
Patient called concerning is bill for the colonoscopy done by Dr Marius Ditch on 10-08-17. He would like to talk with someone about the coding so his insurance will cover the procedure. Please call patient

## 2018-02-02 ENCOUNTER — Other Ambulatory Visit: Payer: Self-pay | Admitting: Family Medicine

## 2018-02-02 ENCOUNTER — Other Ambulatory Visit: Payer: Self-pay | Admitting: Cardiovascular Disease

## 2018-02-02 DIAGNOSIS — I10 Essential (primary) hypertension: Secondary | ICD-10-CM

## 2018-02-03 ENCOUNTER — Other Ambulatory Visit: Payer: Self-pay | Admitting: Cardiovascular Disease

## 2018-02-03 ENCOUNTER — Other Ambulatory Visit: Payer: Self-pay | Admitting: Family Medicine

## 2018-02-16 ENCOUNTER — Other Ambulatory Visit: Payer: Self-pay | Admitting: Cardiovascular Disease

## 2018-02-16 DIAGNOSIS — I480 Paroxysmal atrial fibrillation: Secondary | ICD-10-CM

## 2018-02-18 NOTE — Telephone Encounter (Signed)
Please review for refill, Thanks !  

## 2018-02-24 ENCOUNTER — Other Ambulatory Visit: Payer: Self-pay | Admitting: Family Medicine

## 2018-02-24 ENCOUNTER — Other Ambulatory Visit: Payer: Self-pay | Admitting: Cardiovascular Disease

## 2018-02-24 DIAGNOSIS — I10 Essential (primary) hypertension: Secondary | ICD-10-CM

## 2018-02-25 NOTE — Telephone Encounter (Signed)
Requested medication (s) are due for refill today: Yes  Requested medication (s) are on the active medication list: Yes  Last refill:  02/04/18  Future visit scheduled: Yes  Notes to clinic:  Unable to refill, cannot delegate     Requested Prescriptions  Pending Prescriptions Disp Refills   cyclobenzaprine (FLEXERIL) 10 MG tablet [Pharmacy Med Name: CYCLOBENZAPRINE 10MG  TABLETS] 60 tablet 0    Sig: TAKE 1 TABLET(10 MG) BY MOUTH THREE TIMES DAILY AS NEEDED FOR MUSCLE SPASMS     Not Delegated - Analgesics:  Muscle Relaxants Failed - 02/24/2018 10:28 AM      Failed - This refill cannot be delegated      Passed - Valid encounter within last 6 months    Recent Outpatient Visits          1 month ago Anemia, unspecified type   [facility] Crissman, Jeannette How, MD   6 months ago Bright red rectal bleeding   [place] Crissman, Jeannette How, MD   7 months ago Paroxysmal atrial fibrillation [facility])   [place] Crissman, Jeannette How, MD   11 months ago Chest pain, unspecified type   [facility], [place], [place]   1 year ago Essential hypertension   [place] Crissman, Jeannette How, MD      Future Appointments            In 4 months Crissman, Jeannette How, MD [place], [facility]         Signed Prescriptions Disp Refills   traZODone (DESYREL) 50 MG tablet 90 tablet 1    Sig: TAKE 1 TABLET BY MOUTH EVERY NIGHT AT BEDTIME AS NEEDED FOR INSOMNIA     Psychiatry: Antidepressants - Serotonin Modulator Passed - 02/24/2018 10:28 AM      Passed - Valid encounter within last 6 months    Recent Outpatient Visits          1 month ago Anemia, unspecified type   [facility] Crissman, Jeannette How, MD   6 months ago Bright red rectal bleeding   [facility] Crissman, Jeannette How, MD   7 months ago Paroxysmal atrial fibrillation [facility])   [place] Crissman, Jeannette How, MD   11 months ago Chest pain,  unspecified type   [facility], [place], [place]   1 year ago Essential hypertension   [place], Jeannette How, MD      Future Appointments            In 4 months Crissman, Jeannette How, MD [facility], [facility]

## 2018-02-27 DIAGNOSIS — H90A21 Sensorineural hearing loss, unilateral, right ear, with restricted hearing on the contralateral side: Secondary | ICD-10-CM | POA: Diagnosis not present

## 2018-02-27 DIAGNOSIS — H1045 Other chronic allergic conjunctivitis: Secondary | ICD-10-CM | POA: Diagnosis not present

## 2018-02-27 DIAGNOSIS — H9319 Tinnitus, unspecified ear: Secondary | ICD-10-CM | POA: Diagnosis not present

## 2018-02-28 ENCOUNTER — Other Ambulatory Visit: Payer: Self-pay | Admitting: Otolaryngology

## 2018-02-28 DIAGNOSIS — H90A21 Sensorineural hearing loss, unilateral, right ear, with restricted hearing on the contralateral side: Secondary | ICD-10-CM

## 2018-03-06 ENCOUNTER — Ambulatory Visit: Admission: RE | Admit: 2018-03-06 | Payer: 59 | Source: Ambulatory Visit

## 2018-03-11 DIAGNOSIS — H9191 Unspecified hearing loss, right ear: Secondary | ICD-10-CM | POA: Diagnosis not present

## 2018-03-11 DIAGNOSIS — H9311 Tinnitus, right ear: Secondary | ICD-10-CM | POA: Diagnosis not present

## 2018-03-24 ENCOUNTER — Other Ambulatory Visit: Payer: Self-pay | Admitting: Family Medicine

## 2018-03-24 DIAGNOSIS — M1731 Unilateral post-traumatic osteoarthritis, right knee: Secondary | ICD-10-CM

## 2018-03-25 NOTE — Telephone Encounter (Signed)
Requested medication (s) are due for refill today: Yes  Requested medication (s) are on the active medication list: Yes  Last refill:  11/05/17  Future visit scheduled: Yes  Notes to clinic:  Unable to refill, cannot delegate.     Requested Prescriptions  Pending Prescriptions Disp Refills   traMADol (ULTRAM) 50 MG tablet [Pharmacy Med Name: TRAMADOL 50MG  TABLETS] 120 tablet     Sig: TAKE 1 TO 2 TABLETS(50 TO 100 MG) BY MOUTH TWICE DAILY     Not Delegated - Analgesics:  Opioid Agonists Failed - 03/24/2018 11:29 AM      Failed - This refill cannot be delegated      Failed - Urine Drug Screen completed in last 360 days.      Passed - Valid encounter within last 6 months    Recent Outpatient Visits          2 months ago Anemia, unspecified type   [facility] Crissman, Jeannette How, MD   7 months ago Bright red rectal bleeding   [facility] Crissman, Jeannette How, MD   8 months ago Paroxysmal atrial fibrillation [facility])   [facility] Crissman, Jeannette How, MD   1 year ago Chest pain, unspecified type   [facility] Vol[place] American, [place]   1 year ago Essential hypertension   [place], Jeannette How, MD      Future Appointments            In 3 months Crissman, Jeannette How, MD [facility], [facility]

## 2018-05-05 ENCOUNTER — Other Ambulatory Visit: Payer: Self-pay | Admitting: Family Medicine

## 2018-05-05 NOTE — Telephone Encounter (Signed)
Can pt have  A refill on this med

## 2018-05-06 NOTE — Telephone Encounter (Signed)
Requested medication (s) are due for refill today: yes  Requested medication (s) are on the active medication list: yes  Last refill: 02/25/2018  Future visit scheduled: yes  Notes to clinic:  Not delegated   Requested Prescriptions  Pending Prescriptions Disp Refills   cyclobenzaprine (FLEXERIL) 10 MG tablet [Pharmacy Med Name: CYCLOBENZAPRINE 10MG  TABLETS] 60 tablet 0    Sig: TAKE 1 TABLET(10 MG) BY MOUTH THREE TIMES DAILY AS NEEDED FOR MUSCLE SPASMS     Not Delegated - Analgesics:  Muscle Relaxants Failed - 05/05/2018  3:20 PM      Failed - This refill cannot be delegated      Passed - Valid encounter within last 6 months    Recent Outpatient Visits          4 months ago Anemia, unspecified type   [facility] [place], Jeannette How, MD   9 months ago Bright red rectal bleeding   [facility] [place], Jeannette How, MD   10 months ago Paroxysmal atrial fibrillation [facility])   [facility], Jeannette How, MD   1 year ago Chest pain, unspecified type   [facility] Volney American, [place]   1 year ago Essential hypertension   [place], Jeannette How, MD      Future Appointments            In 2 months Cr[place], Jeannette How, MD [facility], [facility]

## 2018-05-09 DIAGNOSIS — H90A21 Sensorineural hearing loss, unilateral, right ear, with restricted hearing on the contralateral side: Secondary | ICD-10-CM | POA: Diagnosis not present

## 2018-06-04 ENCOUNTER — Other Ambulatory Visit: Payer: Self-pay | Admitting: Family Medicine

## 2018-06-04 NOTE — Telephone Encounter (Signed)
Requested medication (s) are due for refill today: yes  Requested medication (s) are on the active medication list: yes  Last refill:  05/06/18 #60  Future visit scheduled: Yes  Notes to clinic:  Unable to refill per protocol    Requested Prescriptions  Pending Prescriptions Disp Refills   cyclobenzaprine (FLEXERIL) 10 MG tablet [Pharmacy Med Name: CYCLOBENZAPRINE 10MG  TABLETS] 60 tablet 0    Sig: TAKE 1 TABLET(10 MG) BY MOUTH THREE TIMES DAILY AS NEEDED FOR MUSCLE SPASMS     Not Delegated - Analgesics:  Muscle Relaxants Failed - 06/04/2018  6:18 PM      Failed - This refill cannot be delegated      Passed - Valid encounter within last 6 months    Recent Outpatient Visits          5 months ago Anemia, unspecified type   [facility], Jeannette How, MD   10 months ago Bright red rectal bleeding   [facility], Jeannette How, MD   11 months ago Paroxysmal atrial fibrillation [facility])   [place], Jeannette How, MD   1 year ago Chest pain, unspecified type   [facility], Lilia Argue, [place]   1 year ago Essential hypertension   [place], Jeannette How, MD      Future Appointments            In 1 month Criss[place], Jeannette How, MD [place], [facility]         Refused Prescriptions Disp Refills   traZODone (DESYREL) 50 MG tablet [Pharmacy Med Name: TRAZODONE 50MG  TABLETS] 90 tablet 1    Sig: TAKE 1 TABLET BY MOUTH EVERY NIGHT AT BEDTIME AS NEEDED FOR INSOMNIA     Psychiatry: Antidepressants - Serotonin Modulator Passed - 06/04/2018  6:18 PM      Passed - Valid encounter within last 6 months    Recent Outpatient Visits          5 months ago Anemia, unspecified type   [facility] [place], Jeannette How, MD   10 months ago Bright red rectal bleeding   [facility], Jeannette How, MD   11 months ago Paroxysmal atrial fibrillation [facility])   [facility]  [place], Jeannette How, MD   1 year ago Chest pain, unspecified type   [facility], Lilia Argue, [place]   1 year ago Essential hypertension   [place], Jeannette How, MD      Future Appointments            In 1 month Crissman, Jeannette How, MD [place], [facility]          mirtazapine (REMERON) 15 MG tablet [Pharmacy Med Name: MIRTAZAPINE 15MG  TABLETS] 90 tablet 1    Sig: TAKE 1 TABLET(15 MG) BY MOUTH AT BEDTIME AS NEEDED FOR SLEEP     Psychiatry: Antidepressants - mirtazapine Failed - 06/04/2018  6:18 PM      Failed - Triglycerides in normal range and within 360 days    Triglycerides Piccolo,Waived  Date Value Ref Range Status  12/04/2016 147 <150 mg/dL Final    Comment:                            Normal                   <150  Borderline High     150 - 199                         High                200 - 499                         Very High                >499          Failed - Total Cholesterol in normal range and within 360 days    Cholesterol Piccolo, Waived  Date Value Ref Range Status  12/04/2016 195 <200 mg/dL Final    Comment:                            Desirable                <200                         Borderline High      200- 239                         High                     >239          Passed - AST in normal range and within 360 days    AST  Date Value Ref Range Status  01/02/2018 14 0 - 40 IU/L Final   AST (SGOT) Piccolo, Waived  Date Value Ref Range Status  12/04/2016 26 11 - 38 U/L Final         Passed - ALT in normal range and within 360 days    ALT  Date Value Ref Range Status  01/02/2018 15 0 - 44 IU/L Final   ALT (SGPT) Piccolo, Waived  Date Value Ref Range Status  12/04/2016 16 10 - 47 U/L Final         Passed - WBC in normal range and within 360 days    WBC  Date Value Ref Range Status  01/02/2018 8.1 3.4 - 10.8 x10E3/uL Final  03/14/2017 6.5 3.8 - 10.6 K/uL  Final         Passed - Valid encounter within last 6 months    Recent Outpatient Visits          5 months ago Anemia, unspecified type   [facility], Jeannette How, MD   10 months ago Bright red rectal bleeding   [facility], Jeannette How, MD   11 months ago Paroxysmal atrial fibrillation [facility])   [facility], Jeannette How, MD   1 year ago Chest pain, unspecified type   [facility], Lilia Argue, [place]   1 year ago Essential hypertension   [place], Jeannette How, MD      Future Appointments            In 1 month Crissman, Jeannette How, MD [facility], [facility]

## 2018-06-16 ENCOUNTER — Other Ambulatory Visit: Payer: Self-pay | Admitting: Family Medicine

## 2018-06-16 NOTE — Telephone Encounter (Signed)
Requested Prescriptions  Pending Prescriptions Disp Refills  . traZODone (DESYREL) 50 MG tablet [Pharmacy Med Name: TRAZODONE 50MG  TABLETS] 90 tablet 0    Sig: TAKE 1 TABLET BY MOUTH EVERY NIGHT AT BEDTIME AS NEEDED FOR INSOMNIA     Psychiatry: Antidepressants - Serotonin Modulator Failed - 06/16/2018 10:50 AM      Failed - Completed PHQ-2 or PHQ-9 in the last 360 days.      Passed - Valid encounter within last 6 months    Recent Outpatient Visits          5 months ago Anemia, unspecified type   [facility] [place], Jeannette How, MD   10 months ago Bright red rectal bleeding   [facility], Jeannette How, MD   11 months ago Paroxysmal atrial fibrillation [facility])   [facility], Jeannette How, MD   1 year ago Chest pain, unspecified type   [facility]ney American, [place]   1 year ago Essential hypertension   [place], Jeannette How, MD      Future Appointments            In 3 weeks Crissman, Jeannette How, MD [facility], [facility]

## 2018-06-30 ENCOUNTER — Other Ambulatory Visit: Payer: Self-pay | Admitting: Family Medicine

## 2018-06-30 DIAGNOSIS — M1731 Unilateral post-traumatic osteoarthritis, right knee: Secondary | ICD-10-CM

## 2018-07-09 ENCOUNTER — Ambulatory Visit (INDEPENDENT_AMBULATORY_CARE_PROVIDER_SITE_OTHER): Payer: 59 | Admitting: Family Medicine

## 2018-07-09 ENCOUNTER — Other Ambulatory Visit: Payer: Self-pay

## 2018-07-09 ENCOUNTER — Encounter: Payer: Self-pay | Admitting: Family Medicine

## 2018-07-09 DIAGNOSIS — M5117 Intervertebral disc disorders with radiculopathy, lumbosacral region: Secondary | ICD-10-CM

## 2018-07-09 DIAGNOSIS — I10 Essential (primary) hypertension: Secondary | ICD-10-CM | POA: Diagnosis not present

## 2018-07-09 DIAGNOSIS — E782 Mixed hyperlipidemia: Secondary | ICD-10-CM

## 2018-07-09 MED ORDER — TRAZODONE HCL 100 MG PO TABS: 100.0000 mg | ORAL_TABLET | Freq: Every day | ORAL | 3 refills | Status: DC

## 2018-07-09 NOTE — Assessment & Plan Note (Signed)
The current medical regimen is effective;  continue present plan and medications.  

## 2018-07-09 NOTE — Assessment & Plan Note (Addendum)
With worsening symptoms will refer back to pain clinic as patient got good relief from injections from pain clinic in [place].

## 2018-07-09 NOTE — Progress Notes (Signed)
There were no vitals taken for this visit.   Subjective:    Patient ID: [name], male    DOB: 10-14-1955, 63 y.o.   MRN: [id number]  HPI: [name] is a 63 y.o. male  Med check Patient with ongoing back pain and sciatica type symptoms from disc disease has been to a pain clinic down in [place] but wants to transfer care here because this is where he works now.  Wants referral to local pain clinic which was done. Blood pressure stable. Cardiac status stable.    Relevant past medical, surgical, family and social history reviewed and updated as indicated. Interim medical history since our last visit reviewed. Allergies and medications reviewed and updated.  Review of Systems  Constitutional: Negative.   Respiratory: Negative.   Cardiovascular: Negative.     Per HPI unless specifically indicated above     Objective:    There were no vitals taken for this visit.  Wt Readings from Last 3 Encounters:  01/02/18 (!) 346 lb 14.4 oz (157.4 kg)  11/30/17 (!) 347 lb 8 oz (157.6 kg)  10/08/17 (!) 320 lb (145.2 kg)    Physical Exam  Results for orders placed or performed in visit on 01/02/18  TSH  Result Value Ref Range   TSH 1.900 0.450 - 4.500 uIU/mL  ALT  Result Value Ref Range   ALT 15 0 - 44 IU/L  AST  Result Value Ref Range   AST 14 0 - 40 IU/L  Basic metabolic panel  Result Value Ref Range   Glucose 101 (H) 65 - 99 mg/dL   BUN 15 8 - 27 mg/dL   Creatinine, Ser 1.09 0.76 - 1.27 mg/dL   GFR calc non Af Amer 71 >59 mL/min/1.73   GFR calc Af Amer 82 >59 mL/min/1.73   BUN/Creatinine Ratio 14 10 - 24   Sodium 143 134 - 144 mmol/L   Potassium 4.8 3.5 - 5.2 mmol/L   Chloride 106 96 - 106 mmol/L   CO2 24 20 - 29 mmol/L   Calcium 9.0 8.6 - 10.2 mg/dL  CBC with Differential/Platelet  Result Value Ref Range   WBC 8.1 3.4 - 10.8 x10E3/uL   RBC 5.03 4.14 - 5.80 x10E6/uL   Hemoglobin 13.1 13.0 - 17.7 g/dL   Hematocrit 39.7 37.5 - 51.0 %   MCV 79 79 - 97 fL   MCH 26.0 (L) 26.6 - 33.0 pg   MCHC 33.0 31.5 - 35.7 g/dL   RDW 14.4 12.3 - 15.4 %   Platelets 283 150 - 450 x10E3/uL   Neutrophils 58 Not Estab. %   Lymphs 29 Not Estab. %   Monocytes 11 Not Estab. %   Eos 1 Not Estab. %   Basos 1 Not Estab. %   Neutrophils Absolute 4.8 1.4 - 7.0 x10E3/uL   Lymphocytes Absolute 2.3 0.7 - 3.1 x10E3/uL   Monocytes Absolute 0.9 0.1 - 0.9 x10E3/uL   EOS (ABSOLUTE) 0.1 0.0 - 0.4 x10E3/uL   Basophils Absolute 0.0 0.0 - 0.2 x10E3/uL   Immature Granulocytes 0 Not Estab. %   Immature Grans (Abs) 0.0 0.0 - 0.1 x10E3/uL      Assessment & Plan:   Problem List Items Addressed This Visit      Cardiovascular and Mediastinum   Essential hypertension    The current medical regimen is effective;  continue present plan and medications.         Nervous and Auditory   Intervertebral disc disorder  with radiculopathy of lumbosacral region    With worsening symptoms will refer back to pain clinic as patient got good relief from injections from pain clinic in [place].      Relevant Medications   traZODone (DESYREL) 100 MG tablet   Other Relevant Orders   Ambulatory referral to Pain Clinic     Other   Hyperlipidemia    The current medical regimen is effective;  continue present plan and medications.           Follow up plan: Return for Physical Exam, son.

## 2018-07-15 ENCOUNTER — Encounter: Payer: Self-pay | Admitting: Family Medicine

## 2018-07-15 ENCOUNTER — Other Ambulatory Visit: Payer: Self-pay

## 2018-07-15 ENCOUNTER — Ambulatory Visit: Payer: Self-pay

## 2018-07-15 ENCOUNTER — Ambulatory Visit (INDEPENDENT_AMBULATORY_CARE_PROVIDER_SITE_OTHER): Payer: 59 | Admitting: Family Medicine

## 2018-07-15 DIAGNOSIS — J069 Acute upper respiratory infection, unspecified: Secondary | ICD-10-CM

## 2018-07-15 NOTE — Assessment & Plan Note (Signed)
Discussed URI care and treatment also potential for tickborne illness.  But with COVID-19 pandemic will do testing for COVID-19.

## 2018-07-15 NOTE — Telephone Encounter (Signed)
Pt called stating that his granddaughter who lives with him was tested on Thursday last week for symptoms of COVID-19. She has not received her result. Mr Vanputten states that over the weekend Friday into Saturday he developed Congestion, runny nose, cough, He has a sore throat headache and is sure he had a fever because he broke into the sweats. He states he can't taste or smell real good because of congestion. Care advice read to patient. Patient verbalized understanding. Call transfered to office for scheduling.  Reason for Disposition . [1] COVID-19 infection suspected by caller or triager AND [2] mild symptoms (cough, fever, or others) AND [1] no complications or SOB  Answer Assessment - Initial Assessment Questions 1. COVID-19 DIAGNOSIS: "Who made your Coronavirus (COVID-19) diagnosis?" "Was it confirmed by a positive lab test?" If not diagnosed by a HCP, ask "Are there lots of cases (community spread) where you live?" (See public health department website, if unsure)     Tested not confirmed granddaughter 2. ONSET: "When did the COVID-19 symptoms start?"      Saturday  3. WORST SYMPTOM: "What is your worst symptom?" (e.g., cough, fever, shortness of breath, muscle aches)     Congestion in head 4. COUGH: "Do you have a cough?" If so, ask: "How bad is the cough?"       occasional 5. FEVER: "Do you have a fever?" If so, ask: "What is your temperature, how was it measured, and when did it start?"     feverish 6. RESPIRATORY STATUS: "Describe your breathing?" (e.g., shortness of breath, wheezing, unable to speak)      no 7. BETTER-SAME-WORSE: "Are you getting better, staying the same or getting worse compared to yesterday?"  If getting worse, ask, "In what way?"    worse 8. HIGH RISK DISEASE: "Do you have any chronic medical problems?" (e.g., asthma, heart or lung disease, weak immune system, etc.)    A fib 9. PREGNANCY: "Is there any chance you are pregnant?" "When was your last menstrual  period?"   No 10. OTHER SYMPTOMS: "Do you have any other symptoms?"  (e.g., chills, fatigue, headache, loss of smell or taste, muscle pain, sore throat)        Headache, taste is off, sore throat on Saturday  Protocols used: CORONAVIRUS (COVID-19) DIAGNOSED OR SUSPECTED-A-AH

## 2018-07-15 NOTE — Progress Notes (Signed)
There were no vitals taken for this visit.   Subjective:    Patient ID: [name], male    DOB: Jan 09, 1956, 63 y.o.   MRN: [id number]  HPI: [name] is a 63 y.o. male  COVID-19 exposure  Discussed with patient granddaughter has had testing for COVID-19 with symptoms.  Patient has been around his granddaughter his wife and patient now have symptoms.  Patient bothered mostly with head cold type symptoms congestion drainage low-grade fever. Does not feel well and generalized achiness. No known tick exposure.   Relevant past medical, surgical, family and social history reviewed and updated as indicated. Interim medical history since our last visit reviewed. Allergies and medications reviewed and updated.  Review of Systems  Constitutional: Positive for chills, diaphoresis, fatigue and fever.  HENT: Positive for congestion.   Respiratory: Negative for cough and shortness of breath.   Cardiovascular: Negative.     Per HPI unless specifically indicated above     Objective:    There were no vitals taken for this visit.  Wt Readings from Last 3 Encounters:  01/02/18 (!) 346 lb 14.4 oz (157.4 kg)  11/30/17 (!) 347 lb 8 oz (157.6 kg)  10/08/17 (!) 320 lb (145.2 kg)    Physical Exam  Results for orders placed or performed in visit on 01/02/18  TSH  Result Value Ref Range   TSH 1.900 0.450 - 4.500 uIU/mL  ALT  Result Value Ref Range   ALT 15 0 - 44 IU/L  AST  Result Value Ref Range   AST 14 0 - 40 IU/L  Basic metabolic panel  Result Value Ref Range   Glucose 101 (H) 65 - 99 mg/dL   BUN 15 8 - 27 mg/dL   Creatinine, Ser 1.09 0.76 - 1.27 mg/dL   GFR calc non Af Amer 71 >59 mL/min/1.73   GFR calc Af Amer 82 >59 mL/min/1.73   BUN/Creatinine Ratio 14 10 - 24   Sodium 143 134 - 144 mmol/L   Potassium 4.8 3.5 - 5.2 mmol/L   Chloride 106 96 - 106 mmol/L   CO2 24 20 - 29 mmol/L   Calcium 9.0 8.6 - 10.2 mg/dL  CBC with Differential/Platelet  Result Value Ref Range   WBC 8.1 3.4 - 10.8 x10E3/uL   RBC 5.03 4.14 - 5.80 x10E6/uL   Hemoglobin 13.1 13.0 - 17.7 g/dL   Hematocrit 39.7 37.5 - 51.0 %   MCV 79 79 - 97 fL   MCH 26.0 (L) 26.6 - 33.0 pg   MCHC 33.0 31.5 - 35.7 g/dL   RDW 14.4 12.3 - 15.4 %   Platelets 283 150 - 450 x10E3/uL   Neutrophils 58 Not Estab. %   Lymphs 29 Not Estab. %   Monocytes 11 Not Estab. %   Eos 1 Not Estab. %   Basos 1 Not Estab. %   Neutrophils Absolute 4.8 1.4 - 7.0 x10E3/uL   Lymphocytes Absolute 2.3 0.7 - 3.1 x10E3/uL   Monocytes Absolute 0.9 0.1 - 0.9 x10E3/uL   EOS (ABSOLUTE) 0.1 0.0 - 0.4 x10E3/uL   Basophils Absolute 0.0 0.0 - 0.2 x10E3/uL   Immature Granulocytes 0 Not Estab. %   Immature Grans (Abs) 0.0 0.0 - 0.1 x10E3/uL      Assessment & Plan:   Problem List Items Addressed This Visit      Respiratory   URI (upper respiratory infection) - Primary    Discussed URI care and treatment also potential for tickborne illness.  But with COVID-19 pandemic will do testing for COVID-19.      Relevant Orders   Novel Coronavirus, NAA ([facility])  [place] testing site only       Follow up plan: Return if symptoms worsen or fail to improve.

## 2018-07-18 LAB — NOVEL CORONAVIRUS, NAA: SARS-CoV-2, NAA: NOT DETECTED

## 2018-07-22 ENCOUNTER — Encounter: Payer: Self-pay | Admitting: Family Medicine

## 2018-07-25 ENCOUNTER — Encounter: Payer: Self-pay | Admitting: Family Medicine

## 2018-07-25 ENCOUNTER — Other Ambulatory Visit: Payer: Self-pay

## 2018-07-25 ENCOUNTER — Ambulatory Visit (INDEPENDENT_AMBULATORY_CARE_PROVIDER_SITE_OTHER): Payer: 59 | Admitting: Family Medicine

## 2018-07-25 VITALS — BP 116/81 | HR 108 | Temp 98.3°F | Ht 71.0 in | Wt 361.0 lb

## 2018-07-25 DIAGNOSIS — Z0000 Encounter for general adult medical examination without abnormal findings: Secondary | ICD-10-CM | POA: Diagnosis not present

## 2018-07-25 DIAGNOSIS — Z8546 Personal history of malignant neoplasm of prostate: Secondary | ICD-10-CM | POA: Diagnosis not present

## 2018-07-25 DIAGNOSIS — E782 Mixed hyperlipidemia: Secondary | ICD-10-CM

## 2018-07-25 DIAGNOSIS — I10 Essential (primary) hypertension: Secondary | ICD-10-CM

## 2018-07-25 LAB — UA/M W/RFLX CULTURE, ROUTINE
Bilirubin, UA: NEGATIVE
Glucose, UA: NEGATIVE
Ketones, UA: NEGATIVE
Leukocytes,UA: NEGATIVE
Nitrite, UA: NEGATIVE
Protein,UA: NEGATIVE
RBC, UA: NEGATIVE
Specific Gravity, UA: 1.02 (ref 1.005–1.030)
Urobilinogen, Ur: 0.2 mg/dL (ref 0.2–1.0)
pH, UA: 5 (ref 5.0–7.5)

## 2018-07-25 NOTE — Progress Notes (Signed)
BP 116/81   Pulse (!) 108   Temp 98.3 F (36.8 C) (Oral)   Ht 5\' 11"  (1.803 m)   Wt (!) 361 lb (163.7 kg)   SpO2 96%   BMI 50.35 kg/m    Subjective:    Patient ID: [name], male    DOB: [birth year], 63 y.o.   MRN: [id number]  HPI: [name] is a 63 y.o. male presenting on 07/25/2018 for comprehensive medical examination. Current medical complaints include:see below  He currently lives with: Interim Problems from his last visit: no  Depression Screen done today and results listed below:  Depression screen [facility] 2/9 07/25/2018 07/03/2017 12/04/2016 05/22/2016 04/28/2015  Decreased Interest 0 0 0 0 0  Down, Depressed, Hopeless 0 0 0 0 0  PHQ - 2 Score 0 0 0 0 0  Altered sleeping 2 - - - 2  Tired, decreased energy 1 - - - 2  Change in appetite 0 - - - 0  Feeling bad or failure about yourself  0 - - - 0  Trouble concentrating 0 - - - 0  Moving slowly or fidgety/restless 0 - - - 0  Suicidal thoughts 0 - - - 0  PHQ-9 Score 3 - - - 4    The patient does not have a history of falls. I did complete a risk assessment for falls. A plan of care for falls was documented.   Past Medical History:  Past Medical History:  Diagnosis Date  . Cardiomyopathy (in setting of Afib)    a. 12/2013 Echo: EF 45-50%, mild ant and antsept HK. mild MR. Mod dil LA. nl RV fxn. Rhythm was Afib.  Marland Kitchen DJD (degenerative joint disease) of knee   . History of kidney stones   . Intervertebral disc disorder with radiculopathy of lumbosacral region   . Kidney stone 2012  . Lower extremity edema   . PAF (paroxysmal atrial fibrillation) ([place])   . Pernicious anemia   . Prostate cancer ([place])   . Pulmonary nodule, right    a. 10/2014 Cardiac CTA: 25mm RLL nodule; b. 04/2015 CT Chest: stable 75mm RLL nodule. No new nodules; 02/2017 CTA Chest: stable, benign, 64mm RLL pulm nodule.  . SVT (supraventricular tachycardia) [facility])     Surgical History:  Past Surgical History:  Procedure Laterality Date  . CARPAL TUNNEL  RELEASE Right 12/23/2014   Procedure: CARPAL TUNNEL RELEASE;  Surgeon: Earnestine Leys, MD;  Location: [facility] ORS;  Service: Orthopedics;  Laterality: Right;  . CARPAL TUNNEL RELEASE Left 01/06/2015   Procedure: CARPAL TUNNEL RELEASE;  Surgeon: Earnestine Leys, MD;  Location: [facility] ORS;  Service: Orthopedics;  Laterality: Left;  . COLONOSCOPY WITH PROPOFOL N/A 10/08/2017   Procedure: COLONOSCOPY WITH PROPOFOL;  Surgeon: Lin Landsman, MD;  Location: [facility] ENDOSCOPY;  Service: Gastroenterology;  Laterality: N/A;  . GASTRIC BYPASS  2002  . KNEE SURGERY     knee trauma  . prostate seeding      Medications:  Current Outpatient Medications on File Prior to Visit  Medication Sig  . amiodarone (PACERONE) 200 MG tablet TAKE 1 TABLET(200 MG) BY MOUTH DAILY  . amLODipine (NORVASC) 5 MG tablet TAKE 1 TABLET(5 MG) BY MOUTH DAILY  . benazepril (LOTENSIN) 40 MG tablet TAKE 1 TABLET(40 MG) BY MOUTH DAILY  . cyanocobalamin (,VITAMIN B-12,) 1000 MCG/ML injection Inject 1 mL (1,000 mcg total) into the muscle every 30 (thirty) days.  . cyclobenzaprine (FLEXERIL) 10 MG tablet TAKE 1 TABLET(10 MG) BY MOUTH  THREE TIMES DAILY AS NEEDED FOR MUSCLE SPASMS  . ezetimibe (ZETIA) 10 MG tablet TAKE 1 TABLET(10 MG) BY MOUTH DAILY  . furosemide (LASIX) 20 MG tablet Take 1 tablet (20 mg total) by mouth daily as needed.  . gabapentin (NEURONTIN) 400 MG capsule Take 1 capsule (400 mg total) by mouth 3 (three) times daily.  . metoprolol succinate (TOPROL-XL) 25 MG 24 hr tablet TAKE 1 TABLET(25 MG) BY MOUTH DAILY  . mirtazapine (REMERON) 15 MG tablet TAKE 1 TABLET(15 MG) BY MOUTH AT BEDTIME AS NEEDED FOR SLEEP  . rosuvastatin (CRESTOR) 10 MG tablet TAKE 1 TABLET(10 MG) BY MOUTH AT BEDTIME  . traMADol (ULTRAM) 50 MG tablet TAKE 1 TO 2 TABLETS(50 TO 100 MG) BY MOUTH TWICE DAILY  . traZODone (DESYREL) 100 MG tablet Take 1 tablet (100 mg total) by mouth at bedtime.  Alveda Reasons 20 MG TABS tablet TAKE 1 TABLET(20 MG) BY MOUTH DAILY    No current facility-administered medications on file prior to visit.     Allergies:  No Known Allergies  Social History:  Social History   Socioeconomic History  . Marital status: Married    Spouse name: Not on file  . Number of children: Not on file  . Years of education: Not on file  . Highest education level: Not on file  Occupational History  . Not on file  Social Needs  . Financial resource strain: Not on file  . Food insecurity    Worry: Not on file    Inability: Not on file  . Transportation needs    Medical: Not on file    Non-medical: Not on file  Tobacco Use  . Smoking status: Former Smoker    Packs/day: 1.00    Years: 10.00    Pack years: 10.00    Types: Cigarettes    Quit date: 02/18/1979    Years since quitting: 39.4  . Smokeless tobacco: Former Systems developer    Types: Snuff  Substance and Sexual Activity  . Alcohol use: Yes    Alcohol/week: 12.0 standard drinks    Types: 12 Cans of beer per week  . Drug use: No  . Sexual activity: Not on file  Lifestyle  . Physical activity    Days per week: Not on file    Minutes per session: Not on file  . Stress: Not on file  Relationships  . Social Herbalist on phone: Not on file    Gets together: Not on file    Attends religious service: Not on file    Active member of club or organization: Not on file    Attends meetings of clubs or organizations: Not on file    Relationship status: Not on file  . Intimate partner violence    Fear of current or ex partner: Not on file    Emotionally abused: Not on file    Physically abused: Not on file    Forced sexual activity: Not on file  Other Topics Concern  . Not on file  Social History Narrative  . Not on file   Social History   Tobacco Use  Smoking Status Former Smoker  . Packs/day: 1.00  . Years: 10.00  . Pack years: 10.00  . Types: Cigarettes  . Quit date: 02/18/1979  . Years since quitting: 39.4  Smokeless Tobacco Former Systems developer  . Types: Snuff    Social History   Substance and Sexual Activity  Alcohol Use Yes  . Alcohol/week: 12.0 standard  drinks  . Types: 12 Cans of beer per week    Family History:  Family History  Problem Relation Age of Onset  . Heart disease Father   . Hypertension Father   . Stroke Father   . Cancer Father   . Arrhythmia Sister        A-fib  . Arrhythmia Brother        A-fib    Past medical history, surgical history, medications, allergies, family history and social history reviewed with patient today and changes made to appropriate areas of the chart.   Review of Systems - General ROS: negative Psychological ROS: negative Ophthalmic ROS: negative ENT ROS: negative Allergy and Immunology ROS: negative Hematological and Lymphatic ROS: negative Endocrine ROS: negative Breast ROS: negative for breast lumps Respiratory ROS: no cough, shortness of breath, or wheezing Cardiovascular ROS: no chest pain or dyspnea on exertion Gastrointestinal ROS: no abdominal pain, change in bowel habits, or black or bloody stools Genito-Urinary ROS: no dysuria, trouble voiding, or hematuria Musculoskeletal ROS: negative Neurological ROS: no TIA or stroke symptoms Dermatological ROS: negative All other ROS negative except what is listed above and in the HPI.      Objective:    BP 116/81   Pulse (!) 108   Temp 98.3 F (36.8 C) (Oral)   Ht 5\' 11"  (1.803 m)   Wt (!) 361 lb (163.7 kg)   SpO2 96%   BMI 50.35 kg/m   Wt Readings from Last 3 Encounters:  07/25/18 (!) 361 lb (163.7 kg)  01/02/18 (!) 346 lb 14.4 oz (157.4 kg)  11/30/17 (!) 347 lb 8 oz (157.6 kg)    Physical Exam Vitals signs and nursing note reviewed.  Constitutional:      General: He is not in acute distress.    Appearance: He is well-developed.  HENT:     Head: Atraumatic.     Right Ear: Tympanic membrane and external ear normal.     Left Ear: Tympanic membrane and external ear normal.     Nose: Nose normal.     Mouth/Throat:      Mouth: Mucous membranes are moist.     Pharynx: Oropharynx is clear.  Eyes:     General: No scleral icterus.    Conjunctiva/sclera: Conjunctivae normal.     Pupils: Pupils are equal, round, and reactive to light.  Neck:     Musculoskeletal: Normal range of motion and neck supple.  Cardiovascular:     Rate and Rhythm: Normal rate and regular rhythm.     Heart sounds: Normal heart sounds. No murmur.  Pulmonary:     Effort: Pulmonary effort is normal. No respiratory distress.     Breath sounds: Normal breath sounds.  Abdominal:     General: Bowel sounds are normal. There is no distension.     Palpations: Abdomen is soft. There is no mass.     Tenderness: There is no abdominal tenderness. There is no guarding.  Genitourinary:    Comments: No obvious prostate nodularity, tenderness, bogginess on palpation Musculoskeletal: Normal range of motion.        General: No tenderness.  Skin:    General: Skin is warm and dry.     Findings: No rash.  Neurological:     General: No focal deficit present.     Mental Status: He is alert and oriented to person, place, and time.     Deep Tendon Reflexes: Reflexes are normal and symmetric.  Psychiatric:  Mood and Affect: Mood normal.        Behavior: Behavior normal.        Thought Content: Thought content normal.        Judgment: Judgment normal.     Results for orders placed or performed in visit on 07/25/18  CBC with Differential/Platelet  Result Value Ref Range   WBC 7.8 3.4 - 10.8 x10E3/uL   RBC 5.49 4.14 - 5.80 x10E6/uL   Hemoglobin 13.8 13.0 - 17.7 g/dL   Hematocrit 43.5 37.5 - 51.0 %   MCV 79 79 - 97 fL   MCH 25.1 (L) 26.6 - 33.0 pg   MCHC 31.7 31.5 - 35.7 g/dL   RDW 15.7 (H) 11.6 - 15.4 %   Platelets 278 150 - 450 x10E3/uL   Neutrophils 60 Not Estab. %   Lymphs 27 Not Estab. %   Monocytes 12 Not Estab. %   Eos 1 Not Estab. %   Basos 0 Not Estab. %   Neutrophils Absolute 4.7 1.4 - 7.0 x10E3/uL   Lymphocytes Absolute 2.1  0.7 - 3.1 x10E3/uL   Monocytes Absolute 0.9 0.1 - 0.9 x10E3/uL   EOS (ABSOLUTE) 0.1 0.0 - 0.4 x10E3/uL   Basophils Absolute 0.0 0.0 - 0.2 x10E3/uL   Immature Granulocytes 0 Not Estab. %   Immature Grans (Abs) 0.0 0.0 - 0.1 x10E3/uL  Comprehensive metabolic panel  Result Value Ref Range   Glucose 89 65 - 99 mg/dL   BUN 14 8 - 27 mg/dL   Creatinine, Ser 1.16 0.76 - 1.27 mg/dL   GFR calc non Af Amer 66 >59 mL/min/1.73   GFR calc Af Amer 77 >59 mL/min/1.73   BUN/Creatinine Ratio 12 10 - 24   Sodium 143 134 - 144 mmol/L   Potassium 5.9 (H) 3.5 - 5.2 mmol/L   Chloride 105 96 - 106 mmol/L   CO2 24 20 - 29 mmol/L   Calcium 9.2 8.6 - 10.2 mg/dL   Total Protein 7.0 6.0 - 8.5 g/dL   Albumin 4.6 3.8 - 4.8 g/dL   Globulin, Total 2.4 1.5 - 4.5 g/dL   Albumin/Globulin Ratio 1.9 1.2 - 2.2   Bilirubin Total 0.3 0.0 - 1.2 mg/dL   Alkaline Phosphatase 72 39 - 117 IU/L   AST 19 0 - 40 IU/L   ALT 19 0 - 44 IU/L  Lipid Panel w/o Chol/HDL Ratio  Result Value Ref Range   Cholesterol, Total 154 100 - 199 mg/dL   Triglycerides 89 0 - 149 mg/dL   HDL 74 >39 mg/dL   VLDL Cholesterol Cal 18 5 - 40 mg/dL   LDL Calculated 62 0 - 99 mg/dL  PSA  Result Value Ref Range   Prostate Specific Ag, Serum <0.1 0.0 - 4.0 ng/mL  UA/M w/rflx Culture, Routine   Specimen: Urine   URINE  Result Value Ref Range   Specific Gravity, UA 1.020 1.005 - 1.030   pH, UA 5.0 5.0 - 7.5   Color, UA Yellow Yellow   Appearance Ur Clear Clear   Leukocytes,UA Negative Negative   Protein,UA Negative Negative/Trace   Glucose, UA Negative Negative   Ketones, UA Negative Negative   RBC, UA Negative Negative   Bilirubin, UA Negative Negative   Urobilinogen, Ur 0.2 0.2 - 1.0 mg/dL   Nitrite, UA Negative Negative      Assessment & Plan:   Problem List Items Addressed This Visit      Cardiovascular and Mediastinum   Essential hypertension   Relevant  Orders   CBC with Differential/Platelet (Completed)   Comprehensive  metabolic panel (Completed)   UA/M w/rflx Culture, Routine (Completed)     Other   History of prostate cancer   Relevant Orders   PSA (Completed)   Hyperlipidemia   Relevant Orders   Lipid Panel w/o Chol/HDL Ratio (Completed)    Other Visit Diagnoses    Annual physical exam    -  Primary       Discussed aspirin prophylaxis for myocardial infarction prevention and decision was it was not indicated  LABORATORY TESTING:  Health maintenance labs ordered today as discussed above.   The natural history of prostate cancer and ongoing controversy regarding screening and potential treatment outcomes of prostate cancer has been discussed with the patient. The meaning of a false positive PSA and a false negative PSA has been discussed. He indicates understanding of the limitations of this screening test and wishes to proceed with screening PSA testing given his hx of prostate cancer s/p radioactive seed tx.   IMMUNIZATIONS:   - Tdap: Tetanus vaccination status reviewed: last tetanus booster within 10 years. - Influenza: Postponed to flu season - Pneumovax: Not applicable - Prevnar: Not applicable - HPV: Not applicable - Zostavax vaccine: Refused  SCREENING: - Colonoscopy: Up to date  Discussed with patient purpose of the colonoscopy is to detect colon cancer at curable precancerous or early stages   PATIENT COUNSELING:    Sexuality: Discussed sexually transmitted diseases, partner selection, use of condoms, avoidance of unintended pregnancy  and contraceptive alternatives.   Advised to avoid cigarette smoking.  I discussed with the patient that most people either abstain from alcohol or drink within safe limits (<=14/week and <=4 drinks/occasion for males, <=7/weeks and <= 3 drinks/occasion for females) and that the risk for alcohol disorders and other health effects rises proportionally with the number of drinks per week and how often a drinker exceeds daily limits.  Discussed  cessation/primary prevention of drug use and availability of treatment for abuse.   Diet: Encouraged to adjust caloric intake to maintain  or achieve ideal body weight, to reduce intake of dietary saturated fat and total fat, to limit sodium intake by avoiding high sodium foods and not adding table salt, and to maintain adequate dietary potassium and calcium preferably from fresh fruits, vegetables, and low-fat dairy products.    stressed the importance of regular exercise  Injury prevention: Discussed safety belts, safety helmets, smoke detector, smoking near bedding or upholstery.   Dental health: Discussed importance of regular tooth brushing, flossing, and dental visits.   Follow up plan: NEXT PREVENTATIVE PHYSICAL DUE IN 1 YEAR. Return in about 6 months (around 01/25/2019) for 6 month f/u.

## 2018-07-26 ENCOUNTER — Other Ambulatory Visit: Payer: Self-pay | Admitting: Family Medicine

## 2018-07-26 DIAGNOSIS — E875 Hyperkalemia: Secondary | ICD-10-CM

## 2018-07-26 LAB — COMPREHENSIVE METABOLIC PANEL
ALT: 19 IU/L (ref 0–44)
AST: 19 IU/L (ref 0–40)
Albumin/Globulin Ratio: 1.9 (ref 1.2–2.2)
Albumin: 4.6 g/dL (ref 3.8–4.8)
Alkaline Phosphatase: 72 IU/L (ref 39–117)
BUN/Creatinine Ratio: 12 (ref 10–24)
BUN: 14 mg/dL (ref 8–27)
Bilirubin Total: 0.3 mg/dL (ref 0.0–1.2)
CO2: 24 mmol/L (ref 20–29)
Calcium: 9.2 mg/dL (ref 8.6–10.2)
Chloride: 105 mmol/L (ref 96–106)
Creatinine, Ser: 1.16 mg/dL (ref 0.76–1.27)
GFR calc Af Amer: 77 mL/min/{1.73_m2} (ref >59)
GFR calc non Af Amer: 66 mL/min/{1.73_m2} (ref >59)
Globulin, Total: 2.4 g/dL (ref 1.5–4.5)
Glucose: 89 mg/dL (ref 65–99)
Potassium: 5.9 mmol/L — ABNORMAL HIGH (ref 3.5–5.2)
Sodium: 143 mmol/L (ref 134–144)
Total Protein: 7 g/dL (ref 6.0–8.5)

## 2018-07-26 LAB — CBC WITH DIFFERENTIAL/PLATELET
Basophils Absolute: 0 10*3/uL (ref 0.0–0.2)
Basos: 0 %
EOS (ABSOLUTE): 0.1 10*3/uL (ref 0.0–0.4)
Eos: 1 %
Hematocrit: 43.5 % (ref 37.5–51.0)
Hemoglobin: 13.8 g/dL (ref 13.0–17.7)
Immature Grans (Abs): 0 10*3/uL (ref 0.0–0.1)
Immature Granulocytes: 0 %
Lymphocytes Absolute: 2.1 10*3/uL (ref 0.7–3.1)
Lymphs: 27 %
MCH: 25.1 pg — ABNORMAL LOW (ref 26.6–33.0)
MCHC: 31.7 g/dL (ref 31.5–35.7)
MCV: 79 fL (ref 79–97)
Monocytes Absolute: 0.9 10*3/uL (ref 0.1–0.9)
Monocytes: 12 %
Neutrophils Absolute: 4.7 10*3/uL (ref 1.4–7.0)
Neutrophils: 60 %
Platelets: 278 10*3/uL (ref 150–450)
RBC: 5.49 x10E6/uL (ref 4.14–5.80)
RDW: 15.7 % — ABNORMAL HIGH (ref 11.6–15.4)
WBC: 7.8 10*3/uL (ref 3.4–10.8)

## 2018-07-26 LAB — PSA: Prostate Specific Ag, Serum: <0.1 ng/mL (ref 0.0–4.0)

## 2018-07-26 LAB — LIPID PANEL W/O CHOL/HDL RATIO
Cholesterol, Total: 154 mg/dL (ref 100–199)
HDL: 74 mg/dL (ref >39)
LDL Calculated: 62 mg/dL (ref 0–99)
Triglycerides: 89 mg/dL (ref 0–149)
VLDL Cholesterol Cal: 18 mg/dL (ref 5–40)

## 2018-08-04 ENCOUNTER — Other Ambulatory Visit: Payer: Self-pay | Admitting: Family Medicine

## 2018-08-04 ENCOUNTER — Other Ambulatory Visit: Payer: Self-pay | Admitting: Cardiovascular Disease

## 2018-08-04 DIAGNOSIS — D510 Vitamin B12 deficiency anemia due to intrinsic factor deficiency: Secondary | ICD-10-CM

## 2018-08-04 DIAGNOSIS — M1731 Unilateral post-traumatic osteoarthritis, right knee: Secondary | ICD-10-CM

## 2018-08-05 NOTE — Telephone Encounter (Signed)
Requested medications are due for refill today?  Tramadol not due today, Cyanocobalamin is due today   Requested medications are on the active medication list?  Yes  Last refill - Tramadol - 07/01/2018, Cyanocobalamin - 07/03/17  Future visit scheduled?  Yes  Notes to clinic Future visit scheduled for 12/2018.   Requested Prescriptions  Pending Prescriptions Disp Refills   traMADol (ULTRAM) 50 MG tablet [Pharmacy Med Name: TRAMADOL 50MG  TABLETS] 120 tablet     Sig: TAKE 1 TO 2 TABLETS(50 TO 100 MG) BY MOUTH TWICE DAILY     Not Delegated - Analgesics:  Opioid Agonists Failed - 08/04/2018 10:16 AM      Failed - This refill cannot be delegated      Failed - Urine Drug Screen completed in last 360 days.      Passed - Valid encounter within last 6 months    Recent Outpatient Visits          1 week ago Annual physical exam   [facility] Volney American, [place]   3 weeks ago Viral upper respiratory tract infection   [facility] [place]iss[place], Jeannette How, [place]   3 weeks ago Intervertebral disc disorder with radiculopathy of lumbosacral region   [facility], Jeannette How, [place]   7 months ago Anemia, unspecified type   [place], Jeannette How, MD   12 months ago Bright red rectal bleeding   [facility] Jeananne Rama, Jeannette How, MD      Future Appointments            In 5 months Orene Desanctis, Lilia Argue, PA-C [place], [facility]            cyanocobalamin (,VITAMIN B-12,) 1000 MCG/ML injection [Pharmacy Med Name: CYANOCOBALAMIN 1000MCG/ML INJ, 1ML] 10 mL 12    Sig: INJECT 1 ML IN THE MUSCLE EVERY 30 DAYS     Off-Protocol Failed - 08/04/2018 10:16 AM      Failed - Medication not assigned to a protocol, review manually.      Passed - Valid encounter within last 12 months    Recent Outpatient Visits          1 week ago Annual physical exam   [facility] Volney American, [place]   3 weeks ago Viral  upper respiratory tract infection   [facility] Crissman, Jeannette How, MD   3 weeks ago Intervertebral disc disorder with radiculopathy of lumbosacral region   [facility], Jeannette How, MD   7 months ago Anemia, unspecified type   [place], Jeannette How, MD   12 months ago Bright red rectal bleeding   [place], MD      Future Appointments            In 5 months Orene Desanctis Lilia Argue, [place], [facility]          Off-Protocol Failed - 08/04/2018 10:16 AM      Failed - Medication not assigned to a protocol, review manually.      Passed - Valid encounter within last 12 months    Recent Outpatient Visits          1 week ago Annual physical exam   [place], [place]   3 weeks ago Viral upper respiratory tract infection   [place], Jeannette How, MD   3 weeks ago Intervertebral disc disorder with  radiculopathy of lumbosacral region   [facility], Jeannette How, MD   7 months ago Anemia, unspecified type   [place], Jeannette How, MD   12 months ago Bright red rectal bleeding   [facility] Jeananne Rama, Jeannette How, MD      Future Appointments            In 5 months Orene Desanctis, Lilia Argue, [place], [facility]           Refused Prescriptions Disp Refills   traZODone (DESYREL) 50 MG tablet [Pharmacy Med Name: TRAZODONE 50MG  TABLETS] 90 tablet 0    Sig: TAKE 1 TABLET BY MOUTH EVERY NIGHT AT BEDTIME AS NEEDED FOR INSOMNIA     Psychiatry: Antidepressants - Serotonin Modulator Failed - 08/04/2018 10:16 AM      Failed - Completed PHQ-2 or PHQ-9 in the last 360 days.      Passed - Valid encounter within last 6 months    Recent Outpatient Visits          1 week ago Annual physical exam   [facility] Volney American, [place]   3 weeks ago Viral upper respiratory tract  infection   [place], Jeannette How, MD   3 weeks ago Intervertebral disc disorder with radiculopathy of lumbosacral region   [facility], Jeannette How, MD   7 months ago Anemia, unspecified type   [place], Jeannette How, MD   12 months ago Bright red rectal bleeding   [place], [place]      Future Appointments            In 5 months Orene Desanctis, Lilia Argue, [place], [place]

## 2018-08-12 NOTE — Progress Notes (Signed)
Patient's Name: [name]  MRN: [id number]  Referring Provider: Guadalupe Maple, MD  DOB: 06-02-1955  PCP: Guadalupe Maple, MD  DOS: 08/13/2018  Note by: Gillis Santa, MD  Service setting: Ambulatory outpatient  Specialty: Interventional Pain Management  Location: [facility] (AMB) Pain Management Facility  Visit type: Initial Patient Evaluation  Patient type: New Patient   Primary Reason(s) for Visit: Encounter for initial evaluation of one or more chronic problems (new to examiner) potentially causing chronic pain, and posing a threat to normal musculoskeletal function. (Level of risk: High) CC: Back Pain  HPI  [name] is a 63 y.o. year old, male patient, who comes today to see [place] for the first time for an initial evaluation of his chronic pain. He has History of prostate cancer; Essential hypertension; Encounter for anticoagulation discussion and counseling; H/O gastric bypass; Hyperkalemia; DJD (degenerative joint disease) of knee; Carpal tunnel syndrome, bilateral; Morbid obesity ([place]); Hyperlipidemia; Paroxysmal atrial fibrillation ([place]); Pernicious anemia; Angina pectoris ([place]); Bright red rectal bleeding; Abdominal pain; Intervertebral disc disorder with radiculopathy of lumbosacral region; and URI (upper respiratory infection) on their problem list. Today he comes in for evaluation of his Back Pain  Pain Assessment: Location: Lower Back Radiating: radiates down left leg on the side to foot Onset: More than a month ago Duration: Chronic pain Quality: Sharp, Shooting, Numbness Severity: 5 /10 (subjective, self-reported pain score)  Note: Reported level is compatible with observation.                         When using our objective Pain Scale, levels between 6 and 10/10 are said to belong in an emergency room, as it progressively worsens from a 6/10, described as severely limiting, requiring emergency care not usually available at an outpatient pain management facility. At a 6/10 level,  communication becomes difficult and requires great effort. Assistance to reach the emergency department may be required. Facial flushing and profuse sweating along with potentially dangerous increases in heart rate and blood pressure will be evident. Effect on ADL: "I keep missing work due to pain" Timing: Constant Modifying factors: rest BP: 127/78  HR: 95  Onset and Duration: Gradual and Present longer than 3 months Cause of pain: Unknown Severity: Getting worse, NAS-11 at its worse: 9/10, NAS-11 at its best: 4/10, NAS-11 now: 7/10 and NAS-11 on the average: 9/10 Timing: After activity or exercise Aggravating Factors: Kneeling, Prolonged standing, Walking, Walking uphill, Walking downhill and Working Alleviating Factors: Lying down, Medications and Resting Associated Problems: Numbness, Spasms, Sweating, Tingling, Weakness, Pain that wakes patient up and Pain that does not allow patient to sleep Quality of Pain: Constant, Cruel, Disabling, Distressing, Sharp, Shooting, Tingling and Uncomfortable Previous Examinations or Tests: MRI scan, X-rays and Orthopedic evaluation Previous Treatments: Epidural steroid injections and Narcotic medications  The patient comes into the clinics today for the first time for a chronic pain management evaluation.   Patient is a 63 year old male who presents with a chief complaint of low back pain with radiation to left posterior lateral thigh and calf and foot..  Patient was previously being seen at a pain clinic in [place] where he received lumbar epidural steroid injections approximately 40 years ago.  He states that these were a series of 4 injections which significantly helped his low back and leg pain.  He states that over the last 2 and half to 3 months, his leg pain started to return is gotten worse.  Patient does  have a history of gastric bypass and SVT, anticoagulated on Xarelto.  Patient is also on gabapentin for 100 mg 3 times a day.  Patient denies  any bowel or bladder dysfunction.  I encouraged the patient to sign a release of records so that we can get his epidural procedural records from his previous pain clinic.  Patient endorsed understanding.  Tramadol management to continue with PCP.  Historic Controlled Substance Pharmacotherapy Review   08/05/2018  1   08/05/2018  Tramadol Hcl 50 MG Tablet  120.00 30 Ma Cri   [id number]   [facility] ([id number])   0  20.00 MME  Comm Ins   [place]   Medications: The patient did not bring the medication(s) to the appointment, as requested in our "New Patient Package" Pharmacodynamics: Desired effects: Analgesia: The patient reports <50% benefit. Reported improvement in function: The patient reports medication allows him to accomplish basic ADLs. Clinically meaningful improvement in function (CMIF): Sustained CMIF goals met Perceived effectiveness: Described as relatively effective but with some room for improvement Undesirable effects: Side-effects or Adverse reactions: None reported Historical Monitoring: The patient  reports no history of drug use. List of all UDS Test(s): No results found for: MDMA, COCAINSCRNUR, [place], [place], CANNABQUANT, THCU, [place] List of other Serum/Urine Drug Screening Test(s):  No results found for: AMPHSCRSER, BARBSCRSER, BENZOSCRSER, COCAINSCRSER, COCAINSCRNUR, PCPSCRSER, PCPQUANT, THCSCRSER, THCU, CANNABQUANT, OPIATESCRSER, OXYSCRSER, PROPOXSCRSER, ETH Historical Background Evaluation: [place] PMP: PDMP reviewed during this encounter. Six (6) year initial data search conducted.             [place] Department of public safety, offender search: Editor, commissioning Information) Non-contributory Risk Assessment Profile: Aberrant behavior: None observed or detected today Risk factors for fatal opioid overdose: None identified today Fatal overdose hazard ratio (HR): Calculation deferred Non-fatal overdose hazard ratio (HR): Calculation deferred Risk of opioid abuse or dependence: 0.7-3.0% with doses ?  36 MME/day and 6.1-26% with doses ? 120 MME/day. Substance use disorder (SUD) risk level: See below Personal History of Substance Abuse (SUD-Substance use disorder):  Alcohol: Negative  Illegal Drugs: Negative  Rx Drugs: Negative  ORT Risk Level calculation: Low Risk Opioid Risk Tool - 08/13/18 0951      Family History of Substance Abuse   Alcohol  Positive Male    Illegal Drugs  Negative    Rx Drugs  Negative      Personal History of Substance Abuse   Alcohol  Negative    Illegal Drugs  Negative    Rx Drugs  Negative      Age   Age between 80-45 years   No      History of Preadolescent Sexual Abuse   History of Preadolescent Sexual Abuse  Negative or Male      Psychological Disease   Psychological Disease  Negative    Depression  Negative      Total Score   Opioid Risk Tool Scoring  3    Opioid Risk Interpretation  Low Risk      ORT Scoring interpretation table:  Score <3 = Low Risk for SUD  Score between 4-7 = Moderate Risk for SUD  Score >8 = High Risk for Opioid Abuse   PHQ-2 Depression Scale:  Total score: 0  PHQ-2 Scoring interpretation table: (Score and probability of major depressive disorder)  Score 0 = No depression  Score 1 = 15.4% Probability  Score 2 = 21.1% Probability  Score 3 = 38.4% Probability  Score 4 = 45.5% Probability  Score 5 = 56.4%  Probability  Score 6 = 78.6% Probability   PHQ-9 Depression Scale:  Total score: 0  PHQ-9 Scoring interpretation table:  Score 0-4 = No depression  Score 5-9 = Mild depression  Score 10-14 = Moderate depression  Score 15-19 = Moderately severe depression  Score 20-27 = Severe depression (2.4 times higher risk of SUD and 2.89 times higher risk of overuse)   Pharmacologic Plan: MM to continue with PCP, we will focus on interventinal therapies            Initial impression: Continue with PCP  Meds   Current Outpatient Medications:  .  amiodarone (PACERONE) 200 MG tablet, TAKE 1 TABLET(200 MG) BY MOUTH  DAILY, Disp: 90 tablet, Rfl: 3 .  benazepril (LOTENSIN) 40 MG tablet, TAKE 1 TABLET(40 MG) BY MOUTH DAILY, Disp: 90 tablet, Rfl: 3 .  cyanocobalamin (,VITAMIN B-12,) 1000 MCG/ML injection, INJECT 1 ML IN THE MUSCLE EVERY 30 DAYS, Disp: 10 mL, Rfl: 12 .  ezetimibe (ZETIA) 10 MG tablet, TAKE 1 TABLET(10 MG) BY MOUTH DAILY, Disp: 90 tablet, Rfl: 0 .  furosemide (LASIX) 20 MG tablet, Take 1 tablet (20 mg total) by mouth daily as needed., Disp: 90 tablet, Rfl: 3 .  gabapentin (NEURONTIN) 400 MG capsule, Take 1 capsule (400 mg total) by mouth 3 (three) times daily., Disp: 60 capsule, Rfl: 3 .  metoprolol succinate (TOPROL-XL) 25 MG 24 hr tablet, TAKE 1 TABLET(25 MG) BY MOUTH DAILY, Disp: 90 tablet, Rfl: 3 .  mirtazapine (REMERON) 15 MG tablet, TAKE 1 TABLET(15 MG) BY MOUTH AT BEDTIME AS NEEDED FOR SLEEP, Disp: 90 tablet, Rfl: 1 .  rosuvastatin (CRESTOR) 10 MG tablet, TAKE 1 TABLET(10 MG) BY MOUTH AT BEDTIME, Disp: 90 tablet, Rfl: 3 .  traMADol (ULTRAM) 50 MG tablet, TAKE 1 TO 2 TABLETS(50 TO 100 MG) BY MOUTH TWICE DAILY, Disp: 120 tablet, Rfl: 1 .  traZODone (DESYREL) 100 MG tablet, Take 1 tablet (100 mg total) by mouth at bedtime., Disp: 90 tablet, Rfl: 3 .  XARELTO 20 MG TABS tablet, TAKE 1 TABLET(20 MG) BY MOUTH DAILY, Disp: 90 tablet, Rfl: 1 .  amLODipine (NORVASC) 5 MG tablet, TAKE 1 TABLET(5 MG) BY MOUTH DAILY (Patient not taking: Reported on 08/13/2018), Disp: 90 tablet, Rfl: 3 .  cyclobenzaprine (FLEXERIL) 10 MG tablet, TAKE 1 TABLET(10 MG) BY MOUTH THREE TIMES DAILY AS NEEDED FOR MUSCLE SPASMS (Patient not taking: Reported on 08/13/2018), Disp: 60 tablet, Rfl: 1    ROS  Cardiovascular: Abnormal heart rhythm and Blood thinners:  Anticoagulant Pulmonary or Respiratory: No reported pulmonary signs or symptoms such as wheezing and difficulty taking a deep full breath (Asthma), difficulty blowing air out (Emphysema), coughing up mucus (Bronchitis), persistent dry cough, or temporary stoppage of  breathing during sleep Neurological: No reported neurological signs or symptoms such as seizures, abnormal skin sensations, urinary and/or fecal incontinence, being born with an abnormal open spine and/or a tethered spinal cord Review of Past Neurological Studies: No results found for this or any previous visit. Psychological-Psychiatric: No reported psychological or psychiatric signs or symptoms such as difficulty sleeping, anxiety, depression, delusions or hallucinations (schizophrenial), mood swings (bipolar disorders) or suicidal ideations or attempts Gastrointestinal: No reported gastrointestinal signs or symptoms such as vomiting or evacuating blood, reflux, heartburn, alternating episodes of diarrhea and constipation, inflamed or scarred liver, or pancreas or irrregular and/or infrequent bowel movements Genitourinary: No reported renal or genitourinary signs or symptoms such as difficulty voiding or producing urine, peeing blood, non-functioning kidney, kidney stones, difficulty  emptying the bladder, difficulty controlling the flow of urine, or chronic kidney disease Hematological: No reported hematological signs or symptoms such as prolonged bleeding, low or poor functioning platelets, bruising or bleeding easily, hereditary bleeding problems, low energy levels due to low hemoglobin or being anemic Endocrine: No reported endocrine signs or symptoms such as high or low blood sugar, rapid heart rate due to high thyroid levels, obesity or weight gain due to slow thyroid or thyroid disease Rheumatologic: No reported rheumatological signs and symptoms such as fatigue, joint pain, tenderness, swelling, redness, heat, stiffness, decreased range of motion, with or without associated rash Musculoskeletal: Negative for myasthenia gravis, muscular dystrophy, multiple sclerosis or malignant hyperthermia Work History: Working full time  Allergies  Mr. Reister has No Known Allergies.  Laboratory Chemistry    SAFETY SCREENING Profile Lab Results  Component Value Date   SARSCOV2NAA Not Detected 07/15/2018   HIV Non Reactive 03/14/2017   Inflammation Markers (CRP: Acute Phase) (ESR: Chronic Phase) No results found for: CRP, ESRSEDRATE, LATICACIDVEN                       Rheumatology Markers No results found for: RF, ANA, LABURIC, URICUR, LYMEIGGIGMAB, LYMEABIGMQN, HLAB27                      Renal Function Markers Lab Results  Component Value Date   BUN 14 07/25/2018   CREATININE 1.16 07/25/2018   BCR 12 07/25/2018   GFRAA 77 07/25/2018   GFRNONAA 66 07/25/2018                             Hepatic Function Markers Lab Results  Component Value Date   AST 19 07/25/2018   ALT 19 07/25/2018   ALBUMIN 4.6 07/25/2018   ALKPHOS 72 07/25/2018   LIPASE 28 03/13/2017                        Electrolytes Lab Results  Component Value Date   NA 143 07/25/2018   K 5.9 (H) 07/25/2018   CL 105 07/25/2018   CALCIUM 9.2 07/25/2018                        Neuropathy Markers Lab Results  Component Value Date   ZWCHENID78 242 05/22/2016   HIV Non Reactive 03/14/2017                        CNS Tests No results found for: COLORCSF, APPEARCSF, RBCCOUNTCSF, WBCCSF, POLYSCSF, LYMPHSCSF, EOSCSF, PROTEINCSF, GLUCCSF, JCVIRUS, CSFOLI, IGGCSF, LABACHR, ACETBL                      Bone Pathology Markers No results found for: VD25OH, VD125OH2TOT, [id number], [id number], 25OHVITD1, 25OHVITD2, 25OHVITD3, TESTOFREE, TESTOSTERONE                       Coagulation Parameters Lab Results  Component Value Date   INR 1.2 02/27/2014   LABPROT 12.7 (H) 02/27/2014   PLT 278 07/25/2018                        Cardiovascular Markers Lab Results  Component Value Date   TROPONINI <0.03 03/14/2017   HGB 13.8 07/25/2018   HCT 43.5 07/25/2018  ID Test(s) Lab Results  Component Value Date   HIV Non Reactive 03/14/2017   SARSCOV2NAA Not Detected 07/15/2018    CA Markers No  results found for: CEA, CA125, LABCA2                      Endocrine Markers Lab Results  Component Value Date   TSH 1.900 01/02/2018                        Note: Lab results reviewed.  PFSH  Drug: Mr. Hazelip  reports no history of drug use. Alcohol:  reports current alcohol use of about 12.0 standard drinks of alcohol per week. Tobacco:  reports that he quit smoking about 39 years ago. His smoking use included cigarettes. He has a 10.00 pack-year smoking history. He has quit using smokeless tobacco.  His smokeless tobacco use included snuff. Medical:  has a past medical history of Cardiomyopathy (in setting of Afib), DJD (degenerative joint disease) of knee, History of kidney stones, Intervertebral disc disorder with radiculopathy of lumbosacral region, Kidney stone (2012), Lower extremity edema, PAF (paroxysmal atrial fibrillation) ([place]), Pernicious anemia, Prostate cancer ([place]), Pulmonary nodule, right, and SVT (supraventricular tachycardia) ([place]). Family: family history includes Arrhythmia in his brother and sister; Cancer in his father; Heart disease in his father; Hypertension in his father; Stroke in his father.  Past Surgical History:  Procedure Laterality Date  . CARPAL TUNNEL RELEASE Right 12/23/2014   Procedure: CARPAL TUNNEL RELEASE;  Surgeon: Earnestine Leys, MD;  Location: [facility] ORS;  Service: Orthopedics;  Laterality: Right;  . CARPAL TUNNEL RELEASE Left 01/06/2015   Procedure: CARPAL TUNNEL RELEASE;  Surgeon: Earnestine Leys, MD;  Location: [facility] ORS;  Service: Orthopedics;  Laterality: Left;  . COLONOSCOPY WITH PROPOFOL N/A 10/08/2017   Procedure: COLONOSCOPY WITH PROPOFOL;  Surgeon: Lin Landsman, MD;  [facility] END[facility]COPY;  Service: Gastroenterology;  Laterality: N/A;  . GASTRIC BYPASS  2002  . KNEE SURGERY     knee trauma  . prostate seeding     Active Ambulatory Problems    Diagnosis Date Noted  . History of prostate cancer 12/26/2013  . Essential hypertension  12/26/2013  . Encounter for anticoagulation discussion and counseling 12/26/2013  . H/O gastric bypass 03/20/2014  . Hyperkalemia 03/20/2014  . DJD (degenerative joint disease) of knee 10/27/2014  . Carpal tunnel syndrome, bilateral 10/27/2014  . Morbid obesity ([place]) 10/27/2014  . Hyperlipidemia 10/27/2014  . Paroxysmal atrial fibrillation ([place]) 12/20/2015  . Pernicious anemia 05/22/2016  . Angina pectoris ([place]) 03/13/2017  . Bright red rectal bleeding 08/08/2017  . Abdominal pain 08/08/2017  . Intervertebral disc disorder with radiculopathy of lumbosacral region   . URI (upper respiratory infection) 07/15/2018   Resolved Ambulatory Problems    Diagnosis Date Noted  . Rectal bleeding 12/26/2013   Past Medical History:  Diagnosis Date  . Cardiomyopathy (in setting of Afib)   . History of kidney stones   . Kidney stone 2012  . Lower extremity edema   . PAF (paroxysmal atrial fibrillation) ([place])   . Prostate cancer ([place])   . Pulmonary nodule, right   . SVT (supraventricular tachycardia) ([facility])    Constitutional Exam  General appearance: alert, cooperative and morbidly obese Vitals:   08/13/18 0940  BP: 127/78  Pulse: 95  Resp: 18  Temp: 98.2 F (36.8 C)  SpO2: 100%  Weight: (!) 335 lb (152 kg)  Height: 5' 11"  (1.803 m)   BMI  Assessment: Estimated body mass index is 46.72 kg/m as calculated from the following:   Height as of this encounter: 5' 11"  (1.803 m).   Weight as of this encounter: 335 lb (152 kg).  BMI interpretation table: BMI level Category Range association with higher incidence of chronic pain  <18 kg/m2 Underweight   18.5-24.9 kg/m2 Ideal body weight   25-29.9 kg/m2 Overweight Increased incidence by 20%  30-34.9 kg/m2 Obese (Class I) Increased incidence by 68%  35-39.9 kg/m2 Severe obesity (Class II) Increased incidence by 136%  >40 kg/m2 Extreme obesity (Class III) Increased incidence by 254%   Patient's current BMI Ideal Body weight  Body mass  index is 46.72 kg/m. Ideal body weight: 75.3 kg (166 lb 0.1 oz) Adjusted ideal body weight: 106 kg (233 lb 9.7 oz)   BMI Readings from Last 4 Encounters:  08/13/18 46.72 kg/m  07/25/18 50.35 kg/m  01/02/18 47.05 kg/m  11/30/17 47.13 kg/m   Wt Readings from Last 4 Encounters:  08/13/18 (!) 335 lb (152 kg)  07/25/18 (!) 361 lb (163.7 kg)  01/02/18 (!) 346 lb 14.4 oz (157.4 kg)  11/30/17 (!) 347 lb 8 oz (157.6 kg)  Psych/Mental status: Alert, oriented x 3 (person, place, & time)       Eyes: PERLA Respiratory: No evidence of acute respiratory distress  Cervical Spine Area Exam  Skin & Axial Inspection: No masses, redness, edema, swelling, or associated skin lesions Alignment: Symmetrical Functional ROM: Unrestricted ROM      Stability: No instability detected Muscle Tone/Strength: Functionally intact. No obvious neuro-muscular anomalies detected. Sensory (Neurological): Unimpaired Palpation: No palpable anomalies              Upper Extremity (UE) Exam    Side: Right upper extremity  Side: Left upper extremity  Skin & Extremity Inspection: Skin color, temperature, and hair growth are WNL. No peripheral edema or cyanosis. No masses, redness, swelling, asymmetry, or associated skin lesions. No contractures.  Skin & Extremity Inspection: Skin color, temperature, and hair growth are WNL. No peripheral edema or cyanosis. No masses, redness, swelling, asymmetry, or associated skin lesions. No contractures.  Functional ROM: Unrestricted ROM          Functional ROM: Unrestricted ROM          Muscle Tone/Strength: Functionally intact. No obvious neuro-muscular anomalies detected.  Muscle Tone/Strength: Functionally intact. No obvious neuro-muscular anomalies detected.  Sensory (Neurological): Unimpaired          Sensory (Neurological): Unimpaired          Palpation: No palpable anomalies              Palpation: No palpable anomalies              Provocative Test(s):  Phalen's test:  deferred Tinel's test: deferred Apley's scratch test (touch opposite shoulder):  Action 1 (Across chest): deferred Action 2 (Overhead): deferred Action 3 (LB reach): deferred   Provocative Test(s):  Phalen's test: deferred Tinel's test: deferred Apley's scratch test (touch opposite shoulder):  Action 1 (Across chest): deferred Action 2 (Overhead): deferred Action 3 (LB reach): deferred    Thoracic Spine Area Exam  Skin & Axial Inspection: No masses, redness, or swelling Alignment: Symmetrical Functional ROM: Unrestricted ROM Stability: No instability detected Muscle Tone/Strength: Functionally intact. No obvious neuro-muscular anomalies detected. Sensory (Neurological): Unimpaired Muscle strength & Tone: No palpable anomalies  Lumbar Spine Area Exam  Skin & Axial Inspection: No masses, redness, or swelling Alignment: Symmetrical Functional ROM: Decreased ROM affecting  primarily the left Stability: No instability detected Muscle Tone/Strength: Functionally intact. No obvious neuro-muscular anomalies detected. Sensory (Neurological): Dermatomal pain pattern left L4-L5 Palpation: No palpable anomalies       Provocative Tests: Hyperextension/rotation test: deferred today       Lumbar quadrant test (Kemp's test): (+) on the left for foraminal stenosis Lateral bending test: (+) ipsilateral radicular pain, on the left. Positive for left-sided foraminal stenosis. Patrick's Maneuver: deferred today                   FABER* test: deferred today                   S-I anterior distraction/compression test: deferred today         S-I lateral compression test: deferred today         S-I Thigh-thrust test: deferred today         S-I Gaenslen's test: deferred today         *(Flexion, ABduction and External Rotation)  Gait & Posture Assessment  Ambulation: Unassisted Gait: Relatively normal for age and body habitus Posture: WNL   Lower Extremity Exam    Side: Right lower extremity   Side: Left lower extremity  Stability: No instability observed          Stability: No instability observed          Skin & Extremity Inspection: Skin color, temperature, and hair growth are WNL. No peripheral edema or cyanosis. No masses, redness, swelling, asymmetry, or associated skin lesions. No contractures.  Skin & Extremity Inspection: Skin color, temperature, and hair growth are WNL. No peripheral edema or cyanosis. No masses, redness, swelling, asymmetry, or associated skin lesions. No contractures.  Functional ROM: Unrestricted ROM                  Functional ROM: Pain restricted ROM for all joints of the lower extremity          Muscle Tone/Strength: Functionally intact. No obvious neuro-muscular anomalies detected.  Muscle Tone/Strength: Functionally intact. No obvious neuro-muscular anomalies detected.  Sensory (Neurological): Unimpaired        Sensory (Neurological): Dermatomal pain pattern        DTR: Patellar: 1+: trace Achilles: deferred today Plantar: deferred today  DTR: Patellar: 1+: trace Achilles: deferred today Plantar: deferred today  Palpation: No palpable anomalies  Palpation: No palpable anomalies   Assessment  Primary Diagnosis & Pertinent Problem List: The primary encounter diagnosis was Chronic radicular lumbar pain. Diagnoses of Lumbar radiculopathy, Lumbar degenerative disc disease, Chronic pain syndrome, Paroxysmal atrial fibrillation ([place]), Morbid obesity ([place]), H/O gastric bypass, and Other intervertebral disc degeneration, lumbar region were also pertinent to this visit.  Visit Diagnosis (New problems to examiner): 1. Chronic radicular lumbar pain   2. Lumbar radiculopathy   3. Lumbar degenerative disc disease   4. Chronic pain syndrome   5. Paroxysmal atrial fibrillation ([facility])   6. Morbid obesity ([place])   7. H/O gastric bypass   8. Other intervertebral disc degeneration, lumbar region    General Recommendations: The pain condition that the patient  suffers from is best treated with a multidisciplinary approach that involves an increase in physical activity to prevent de-conditioning and worsening of the pain cycle, as well as psychological counseling (formal and/or informal) to address the co-morbid psychological affects of pain. Treatment will often involve judicious use of pain medications and interventional procedures to decrease the pain, allowing the patient to participate in the physical activity  that will ultimately produce long-lasting pain reductions. The goal of the multidisciplinary approach is to return the patient to a higher level of overall function and to restore their ability to perform activities of daily living.  Plan of Care (Initial workup plan)   Imaging Orders     MR LUMBAR SPINE WO CONTRAST  Procedure Orders     Lumbar Epidural Injection  1.  Please obtain records from your pain clinic in [place] specifically regarding your epidural injections and what levels they were done at. 2.  We will obtain lumbar MRI. 3.  Schedule for lumbar epidural steroid injection in 3 to 4 weeks.  If he have not completed her lumbar MRI by then, postpone your epidural steroid injection. 4.  Cardiac clearance to stop Xarelto 3 days prior to scheduled epidural steroid injection.  Medication Management per PCP  Interventional management options: Mr. Baumler was informed that there is no guarantee that he would be a candidate for interventional therapies. The decision will be based on the results of diagnostic studies, as well as Mr. Germond risk profile.  Procedure(s) under consideration:  Diagnostic lumbar ESI   Provider-requested follow-up: Return in about 3 weeks (around 09/03/2018) for Procedure, Blood Thinner Protocol (Lumbar ESI).  Future Appointments  Date Time Provider [place]  01/13/2019  2:30 PM Volney American, [place] [facility]    Primary Care Physician: Guadalupe Maple, MD Location: [facility] Facility Note by: Gillis Santa, MD Date: 08/13/2018; Time: 2:40 PM  Note: This dictation was prepared with Dragon dictation. Any transcriptional errors that may result from this process are unintentional.

## 2018-08-13 ENCOUNTER — Other Ambulatory Visit: Payer: Self-pay

## 2018-08-13 ENCOUNTER — Other Ambulatory Visit: Payer: 59

## 2018-08-13 ENCOUNTER — Telehealth: Payer: Self-pay

## 2018-08-13 ENCOUNTER — Encounter: Payer: Self-pay | Admitting: Student in an Organized Health Care Education/Training Program

## 2018-08-13 ENCOUNTER — Ambulatory Visit
Payer: 59 | Attending: Student in an Organized Health Care Education/Training Program | Admitting: Student in an Organized Health Care Education/Training Program

## 2018-08-13 VITALS — BP 127/78 | HR 95 | Temp 98.2°F | Resp 18 | Ht 71.0 in | Wt 335.0 lb

## 2018-08-13 DIAGNOSIS — M5136 Other intervertebral disc degeneration, lumbar region: Secondary | ICD-10-CM | POA: Diagnosis present

## 2018-08-13 DIAGNOSIS — M5416 Radiculopathy, lumbar region: Secondary | ICD-10-CM | POA: Insufficient documentation

## 2018-08-13 DIAGNOSIS — Z9884 Bariatric surgery status: Secondary | ICD-10-CM

## 2018-08-13 DIAGNOSIS — G894 Chronic pain syndrome: Secondary | ICD-10-CM | POA: Insufficient documentation

## 2018-08-13 DIAGNOSIS — E875 Hyperkalemia: Secondary | ICD-10-CM

## 2018-08-13 DIAGNOSIS — I480 Paroxysmal atrial fibrillation: Secondary | ICD-10-CM | POA: Diagnosis present

## 2018-08-13 DIAGNOSIS — G8929 Other chronic pain: Secondary | ICD-10-CM | POA: Diagnosis present

## 2018-08-13 NOTE — Patient Instructions (Addendum)
It was nice meeting you today.  Today we discussed the following: 1.  Please obtain records from your pain clinic in [place] specifically regarding your epidural injections and what levels they were done at. 2.  We will obtain lumbar MRI. 3.  Schedule for lumbar epidural steroid injection in 3 to 4 weeks.  If he have not completed her lumbar MRI by then, postpone your epidural steroid injection. 4.  Cardiac clearance to stop Xarelto 3 days prior to scheduled epidural steroid injection.  Nurse will call you when permission to stop Xarelto is received.  Preparing for Procedure with Sedation Instructions: . Oral Intake: Do not eat or drink anything for at least 8 hours prior to your procedure. . Transportation: Public transportation is not allowed. Bring an adult driver. The driver must be physically present in our waiting room before any procedure can be started. Marland Kitchen Physical Assistance: Bring an adult capable of physically assisting you, in the event you need help. . Blood Pressure Medicine: Take your blood pressure medicine with a sip of water the morning of the procedure. . Insulin: Take only  of your normal insulin dose. . Preventing infections: Shower with an antibacterial soap the morning of your procedure. . Build-up your immune system: Take 1000 mg of Vitamin C with every meal (3 times a day) the day prior to your procedure. . Pregnancy: If you are pregnant, call and cancel the procedure. . Sickness: If you have a cold, fever, or any active infections, call and cancel the procedure. . Arrival: You must be in the facility at least 30 minutes prior to your scheduled procedure. . Children: Do not bring children with you. . Dress appropriately: Bring dark clothing that you would not mind if they get stained. . Valuables: Do not bring any jewelry or valuables. Procedure appointments are reserved for interventional treatments only. Marland Kitchen No Prescription Refills. . No medication changes will be  discussed during procedure appointments. . No disability issues will be discussed. Marland Kitchen

## 2018-08-13 NOTE — Progress Notes (Signed)
Safety precautions to be maintained throughout the outpatient stay will include: orient to surroundings, keep bed in low position, maintain call bell within reach at all times, provide assistance with transfer out of bed and ambulation.  

## 2018-08-13 NOTE — Telephone Encounter (Signed)
We would like to get permission for this patient to stop Xarelto for 3 days for a Lumbar Epidural procedure to be done by Dr Holley Raring.  We are waiting on a MRI prior to scheduling it.  Thank you!

## 2018-08-14 LAB — BASIC METABOLIC PANEL
BUN/Creatinine Ratio: 12 (ref 10–24)
BUN: 12 mg/dL (ref 8–27)
CO2: 21 mmol/L (ref 20–29)
Calcium: 8.4 mg/dL — ABNORMAL LOW (ref 8.6–10.2)
Chloride: 106 mmol/L (ref 96–106)
Creatinine, Ser: 1 mg/dL (ref 0.76–1.27)
GFR calc Af Amer: 92 mL/min/{1.73_m2} (ref >59)
GFR calc non Af Amer: 79 mL/min/{1.73_m2} (ref >59)
Glucose: 92 mg/dL (ref 65–99)
Potassium: 4.7 mmol/L (ref 3.5–5.2)
Sodium: 141 mmol/L (ref 134–144)

## 2018-08-15 NOTE — Telephone Encounter (Signed)
That would be fine, 3 days thx TGollan

## 2018-08-19 NOTE — Telephone Encounter (Signed)
Attempted to call patient and notify him that we got permission to stop his Xarelto for 3 days from Dr Rockey Situ.  Patient does not have a voicemail set up.  Will notify the front desk that we have approval.

## 2018-09-01 ENCOUNTER — Other Ambulatory Visit: Payer: Self-pay | Admitting: Cardiovascular Disease

## 2018-09-01 DIAGNOSIS — I480 Paroxysmal atrial fibrillation: Secondary | ICD-10-CM

## 2018-09-02 ENCOUNTER — Ambulatory Visit
Admission: RE | Admit: 2018-09-02 | Discharge: 2018-09-02 | Disposition: A | Discharge status: Home / Self Care | Payer: 59 | Source: Ambulatory Visit | Attending: Student in an Organized Health Care Education/Training Program | Admitting: Student in an Organized Health Care Education/Training Program

## 2018-09-02 ENCOUNTER — Other Ambulatory Visit: Payer: Self-pay

## 2018-09-02 ENCOUNTER — Encounter: Payer: Self-pay | Admitting: Student in an Organized Health Care Education/Training Program

## 2018-09-02 ENCOUNTER — Ambulatory Visit (HOSPITAL_BASED_OUTPATIENT_CLINIC_OR_DEPARTMENT_OTHER): Payer: 59 | Admitting: Student in an Organized Health Care Education/Training Program

## 2018-09-02 DIAGNOSIS — G8929 Other chronic pain: Secondary | ICD-10-CM

## 2018-09-02 DIAGNOSIS — M5416 Radiculopathy, lumbar region: Secondary | ICD-10-CM | POA: Insufficient documentation

## 2018-09-02 MED ORDER — DEXAMETHASONE SODIUM PHOSPHATE 10 MG/ML IJ SOLN
10.0000 mg | Freq: Once | INTRAMUSCULAR | Status: AC
  Administered 2018-09-02: 10 mg

## 2018-09-02 MED ORDER — SODIUM CHLORIDE 0.9% FLUSH
1.0000 mL | Freq: Once | INTRAVENOUS | Status: AC
  Administered 2018-09-02: 1 mL

## 2018-09-02 MED ORDER — ROPIVACAINE HCL 2 MG/ML IJ SOLN
INTRAMUSCULAR | Status: AC
  Filled 2018-09-02: qty 10

## 2018-09-02 MED ORDER — LIDOCAINE HCL 2 % IJ SOLN
INTRAMUSCULAR | Status: AC
  Filled 2018-09-02: qty 20

## 2018-09-02 MED ORDER — LIDOCAINE HCL 2 % IJ SOLN
20.0000 mL | Freq: Once | INTRAMUSCULAR | Status: AC
  Administered 2018-09-02: 400 mg

## 2018-09-02 MED ORDER — SODIUM CHLORIDE (PF) 0.9 % IJ SOLN
INTRAMUSCULAR | Status: AC
  Filled 2018-09-02: qty 10

## 2018-09-02 MED ORDER — ROPIVACAINE HCL 2 MG/ML IJ SOLN
1.0000 mL | Freq: Once | INTRAMUSCULAR | Status: AC
  Administered 2018-09-02: 09:00:00 1 mL via EPIDURAL

## 2018-09-02 MED ORDER — IOHEXOL 180 MG/ML  SOLN
10.0000 mL | Freq: Once | INTRAMUSCULAR | Status: AC
  Administered 2018-09-02: 10 mL via INTRATHECAL

## 2018-09-02 MED ORDER — DEXAMETHASONE SODIUM PHOSPHATE 10 MG/ML IJ SOLN
INTRAMUSCULAR | Status: AC
  Filled 2018-09-02: qty 1

## 2018-09-02 NOTE — Progress Notes (Signed)
Patient's Name: [name]  MRN: [id number]  Referring Provider: Gillis Santa, MD  DOB: [birth year]  PCP: Guadalupe Maple, MD  DOS: 09/02/2018  Note by: Gillis Santa, MD  Service setting: Ambulatory outpatient  Specialty: Interventional Pain Management  Patient type: Established  Location: [facility] (AMB) Pain Management Facility  Visit type: Interventional Procedure   Primary Reason for Visit: Interventional Pain Management Treatment. CC: Back Pain (low) and Leg Pain (lateral to toes at times, left)  Procedure:          Anesthesia, Analgesia, Anxiolysis:  Type: Diagnostic Inter-Laminar Epidural Steroid Injection  #1  Region: Lumbar Level: L4-5 Level. Laterality: Left-Sided         Type: Local Anesthesia  Local Anesthetic: Lidocaine 1-2%  Position: Prone with head of the table was raised to facilitate breathing.   Indications: 1. Chronic radicular lumbar pain    Pain Score: Pre-procedure: 6 /10 Post-procedure: 6 /10   Patient's last dose of Xarelto was Thursday.  Pre-op Assessment:  [name] is a 63 y.o. (year old), male patient, seen today for interventional treatment. He  has a past surgical history that includes Knee surgery; Gastric bypass (2002); prostate seeding; Carpal tunnel release (Right, 12/23/2014); Carpal tunnel release (Left, 01/06/2015); and Colonoscopy with propofol (N/A, 10/08/2017). [name] has a current medication list which includes the following prescription(s): amiodarone, amlodipine, benazepril, cyanocobalamin, cyclobenzaprine, ezetimibe, furosemide, gabapentin, metoprolol succinate, mirtazapine, rosuvastatin, tramadol, trazodone, and xarelto. His primarily concern today is the Back Pain (low) and Leg Pain (lateral to toes at times, left)  Initial Vital Signs:  Pulse/HCG Rate: 88  Temp: 98.2 F (36.8 C) Resp: 18 BP: (!) 128/91 SpO2: 97 %  BMI: Estimated body mass index is 50.21 kg/m as calculated from the following:   Height as of this encounter: 5\' 11"   (1.803 m).   Weight as of this encounter: 360 lb (163.3 kg).  Risk Assessment: Allergies: Reviewed. He has No Known Allergies.  Allergy Precautions: None required Coagulopathies: Reviewed. None identified.  Blood-thinner therapy: None at this time Active Infection(s): Reviewed. None identified. [name] is afebrile  Site Confirmation: [name] was asked to confirm the procedure and laterality before marking the site Procedure checklist: Completed Consent: Before the procedure and under the influence of no sedative(s), amnesic(s), or anxiolytics, the patient was informed of the treatment options, risks and possible complications. To fulfill our ethical and legal obligations, as recommended by the American Medical Association's Code of Ethics, I have informed the patient of my clinical impression; the nature and purpose of the treatment or procedure; the risks, benefits, and possible complications of the intervention; the alternatives, including doing nothing; the risk(s) and benefit(s) of the alternative treatment(s) or procedure(s); and the risk(s) and benefit(s) of doing nothing. The patient was provided information about the general risks and possible complications associated with the procedure. These may include, but are not limited to: failure to achieve desired goals, infection, bleeding, organ or nerve damage, allergic reactions, paralysis, and death. In addition, the patient was informed of those risks and complications associated to Spine-related procedures, such as failure to decrease pain; infection (i.e.: Meningitis, epidural or intraspinal abscess); bleeding (i.e.: epidural hematoma, subarachnoid hemorrhage, or any other type of intraspinal or peri-dural bleeding); organ or nerve damage (i.e.: Any type of peripheral nerve, nerve root, or spinal cord injury) with subsequent damage to sensory, motor, and/or autonomic systems, resulting in permanent pain, numbness, and/or weakness of one  or several areas of the body; allergic reactions; (i.e.:  anaphylactic reaction); and/or death. Furthermore, the patient was informed of those risks and complications associated with the medications. These include, but are not limited to: allergic reactions (i.e.: anaphylactic or anaphylactoid reaction(s)); adrenal axis suppression; blood sugar elevation that in diabetics may result in ketoacidosis or comma; water retention that in patients with history of congestive heart failure may result in shortness of breath, pulmonary edema, and decompensation with resultant heart failure; weight gain; swelling or edema; medication-induced neural toxicity; particulate matter embolism and blood vessel occlusion with resultant organ, and/or nervous system infarction; and/or aseptic necrosis of one or more joints. Finally, the patient was informed that Medicine is not an exact science; therefore, there is also the possibility of unforeseen or unpredictable risks and/or possible complications that may result in a catastrophic outcome. The patient indicated having understood very clearly. We have given the patient no guarantees and we have made no promises. Enough time was given to the patient to ask questions, all of which were answered to the patient's satisfaction. [name] has indicated that he wanted to continue with the procedure. Attestation: I, the ordering provider, attest that I have discussed with the patient the benefits, risks, side-effects, alternatives, likelihood of achieving goals, and potential problems during recovery for the procedure that I have provided informed consent. Date  Time: 09/02/2018  8:17 AM  Pre-Procedure Preparation:  Monitoring: As per clinic protocol. Respiration, ETCO2, SpO2, BP, heart rate and rhythm monitor placed and checked for adequate function Safety Precautions: Patient was assessed for positional comfort and pressure points before starting the procedure. Time-out: I initiated  and conducted the "Time-out" before starting the procedure, as per protocol. The patient was asked to participate by confirming the accuracy of the "Time Out" information. Verification of the correct person, site, and procedure were performed and confirmed by me, the nursing staff, and the patient. "Time-out" conducted as per Joint Commission's Universal Protocol (UP.01.01.01). Time: 4287  Description of Procedure:          Target Area: The interlaminar space, initially targeting the lower laminar border of the superior vertebral body. Approach: Paramedial approach. Area Prepped: Entire Posterior Lumbar Region Prepping solution: DuraPrep (Iodine Povacrylex [0.7% available iodine] and Isopropyl Alcohol, 74% w/w) Safety Precautions: Aspiration looking for blood return was conducted prior to all injections. At no point did we inject any substances, as a needle was being advanced. No attempts were made at seeking any paresthesias. Safe injection practices and needle disposal techniques used. Medications properly checked for expiration dates. SDV (single dose vial) medications used. Description of the Procedure: Protocol guidelines were followed. The procedure needle was introduced through the skin, ipsilateral to the reported pain, and advanced to the target area. Bone was contacted and the needle walked caudad, until the lamina was cleared. The epidural space was identified using "loss-of-resistance technique" with 2-3 ml of PF-NaCl (0.9% NSS), in a 5cc LOR glass syringe.  Vitals:   09/02/18 0853 09/02/18 0858 09/02/18 0903 09/02/18 0908  BP: (!) 132/98 (!) 119/95 (!) 123/94 126/82  Pulse: 86 82 83 86  Resp: 17 16 17 16   Temp:      TempSrc:      SpO2: 97% 97% 92% 97%  Weight:      Height:        Start Time: 0858 hrs. End Time: 0910 hrs.  Materials:  Needle(s) Type: Epidural needle Gauge: 17G Length: 5-in Medication(s): Please see orders for medications and dosing details. 8 cc solution  made of 5 cc of preservative-free  saline, 2 cc of 0.2% ropivacaine, 1 cc of Decadron 10 mg/cc.  Imaging Guidance (Spinal):          Type of Imaging Technique: Fluoroscopy Guidance (Spinal) Indication(s): Assistance in needle guidance and placement for procedures requiring needle placement in or near specific anatomical locations not easily accessible without such assistance. Exposure Time: Please see nurses notes. Contrast: Before injecting any contrast, we confirmed that the patient did not have an allergy to iodine, shellfish, or radiological contrast. Once satisfactory needle placement was completed at the desired level, radiological contrast was injected. Contrast injected under live fluoroscopy. No contrast complications. See chart for type and volume of contrast used. Fluoroscopic Guidance: I was personally present during the use of fluoroscopy. "Tunnel Vision Technique" used to obtain the best possible view of the target area. Parallax error corrected before commencing the procedure. "Direction-depth-direction" technique used to introduce the needle under continuous pulsed fluoroscopy. Once target was reached, antero-posterior, oblique, and lateral fluoroscopic projection used confirm needle placement in all planes. Images permanently stored in EMR. Interpretation: I personally interpreted the imaging intraoperatively. Adequate needle placement confirmed in multiple planes. Appropriate spread of contrast into desired area was observed. No evidence of afferent or efferent intravascular uptake. No intrathecal or subarachnoid spread observed. Permanent images saved into the patient's record.  Antibiotic Prophylaxis:   Anti-infectives (From admission, onward)   None     Indication(s): None identified  Post-operative Assessment:  Post-procedure Vital Signs:  Pulse/HCG Rate: 86  Temp: 98.2 F (36.8 C) Resp: 16 BP: 126/82 SpO2: 97 %  EBL: None  Complications: No immediate post-treatment  complications observed by team, or reported by patient.  Note: The patient tolerated the entire procedure well. A repeat set of vitals were taken after the procedure and the patient was kept under observation following institutional policy, for this type of procedure. Post-procedural neurological assessment was performed, showing return to baseline, prior to discharge. The patient was provided with post-procedure discharge instructions, including a section on how to identify potential problems. Should any problems arise concerning this procedure, the patient was given instructions to immediately contact us, at any time, without hesitation. In any case, we plan to contact the patient by telephone for a follow-up status report regarding this interventional procedure.  Comments:  No additional relevant information. 5 out of 5 strength bilateral lower extremity: Plantar flexion, dorsiflexion, knee flexion, knee extension.  Plan of Care  Orders:  Orders Placed This Encounter  Procedures  . DG PAIN CLINIC C-ARM 1-60 MIN NO REPORT    Intraoperative interpretation by procedural physician at [place].    Standing Status:   Standing    Number of Occurrences:   1    Order Specific Question:   Reason for exam:    Answer:   Assistance in needle guidance and placement for procedures requiring needle placement in or near specific anatomical locations not easily accessible without such assistance.   Patient's lumbar MRI was denied.  Will have nursing staff look into reasons why and consider setting up P2P with insurance company to try and get approval  Patient instructed to restart Xarelto tomorrow so long as he is not having any lower extremity weakness after nursing phone call.  Medications ordered for procedure: Meds ordered this encounter  Medications  . iohexol (OMNIPAQUE) 180 MG/ML injection 10 mL    Must be Myelogram-compatible. If not available, you may substitute with a  water-soluble, non-ionic, hypoallergenic, myelogram-compatible radiological contrast medium.  . ropivacaine (PF) 2 mg/mL (  0.2%) (NAROPIN) injection 1 mL  . dexamethasone (DECADRON) injection 10 mg  . sodium chloride flush (NS) 0.9 % injection 1 mL   Medications administered: We administered iohexol, ropivacaine (PF) 2 mg/mL (0.2%), dexamethasone, and sodium chloride flush.  See the medical record for exact dosing, route, and time of administration.  Follow-up plan:   Return in about 4 weeks (around 09/30/2018) for Post Procedure Evaluation, virtual.      s/p lesi #1 on 8/17   Recent Visits Date Type Provider Dept  08/13/18 Office Visit Gillis Santa, MD [facility]  Showing recent visits within past 90 days and meeting all other requirements   Today's Visits Date Type Provider Dept  09/02/18 Procedure visit Gillis Santa, MD [facility]  Showing today's visits and meeting all other requirements   Future Appointments Date Type Provider Dept  10/08/18 Appointment Gillis Santa, MD [facility]  Showing future appointments within next 90 days and meeting all other requirements   Disposition: Discharge home  Discharge Date & Time: 09/02/2018; 0918 hrs.   Primary Care Physician: Guadalupe Maple, MD Location: [facility] Note by: Gillis Santa, MD Date: 09/02/2018; Time: 9:27 AM  Disclaimer:  Medicine is not an exact science. The only guarantee in medicine is that nothing is guaranteed. It is important to note that the decision to proceed with this intervention was based on the information collected from the patient. The Data and conclusions were drawn from the patient's questionnaire, the interview, and the physical examination. Because the information was provided in large part by the patient, it cannot be guaranteed that it has not been purposely or unconsciously manipulated. Every effort has been made to obtain as much relevant  data as possible for this evaluation. It is important to note that the conclusions that lead to this procedure are derived in large part from the available data. Always take into account that the treatment will also be dependent on availability of resources and existing treatment guidelines, considered by other Pain Management Practitioners as being common knowledge and practice, at the time of the intervention. For Medico-Legal purposes, it is also important to point out that variation in procedural techniques and pharmacological choices are the acceptable norm. The indications, contraindications, technique, and results of the above procedure should only be interpreted and judged by a Board-Certified Interventional Pain Specialist with extensive familiarity and expertise in the same exact procedure and technique.

## 2018-09-02 NOTE — Addendum Note (Signed)
Addended by: Gillis Santa on: 09/02/2018 10:18 AM   Modules accepted: Orders

## 2018-09-02 NOTE — Telephone Encounter (Signed)
Pt's age 63, wt 152 kg, SCr 1.00, CrCl 158.33, last ov w/ TG 11/30/17.

## 2018-09-02 NOTE — Patient Instructions (Signed)
____________________________________________________________________________________________  Post-Procedure Discharge Instructions  Instructions:  Apply ice:   Purpose: This will minimize any swelling and discomfort after procedure.   When: Day of procedure, as soon as you get home.  How: Fill a plastic sandwich bag with crushed ice. Cover it with a small towel and apply to injection site.  How long: (15 min on, 15 min off) Apply for 15 minutes then remove x 15 minutes.  Repeat sequence on day of procedure, until you go to bed.  Apply heat:   Purpose: To treat any soreness and discomfort from the procedure.  When: Starting the next day after the procedure.  How: Apply heat to procedure site starting the day following the procedure.  How long: May continue to repeat daily, until discomfort goes away.  Food intake: Start with clear liquids (like water) and advance to regular food, as tolerated.   Physical activities: Keep activities to a minimum for the first 8 hours after the procedure. After that, then as tolerated.  Driving: If you have received any sedation, be responsible and do not drive. You are not allowed to drive for 24 hours after having sedation.  Blood thinner: (Applies only to those taking blood thinners) You may restart your blood thinner 6 hours after your procedure.  Insulin: (Applies only to Diabetic patients taking insulin) As soon as you can eat, you may resume your normal dosing schedule.  Infection prevention: Keep procedure site clean and dry. Shower daily and clean area with soap and water.  Post-procedure Pain Diary: Extremely important that this be done correctly and accurately. Recorded information will be used to determine the next step in treatment. For the purpose of accuracy, follow these rules:  Evaluate only the area treated. Do not report or include pain from an untreated area. For the purpose of this evaluation, ignore all other areas of pain,  except for the treated area.  After your procedure, avoid taking a long nap and attempting to complete the pain diary after you wake up. Instead, set your alarm clock to go off every hour, on the hour, for the initial 8 hours after the procedure. Document the duration of the numbing medicine, and the relief you are getting from it.  Do not go to sleep and attempt to complete it later. It will not be accurate. If you received sedation, it is likely that you were given a medication that may cause amnesia. Because of this, completing the diary at a later time may cause the information to be inaccurate. This information is needed to plan your care.  Follow-up appointment: Keep your post-procedure follow-up evaluation appointment after the procedure (usually 2 weeks for most procedures, 6 weeks for radiofrequencies). DO NOT FORGET to bring you pain diary with you.   Expect: (What should I expect to see with my procedure?)  From numbing medicine (AKA: Local Anesthetics): Numbness or decrease in pain. You may also experience some weakness, which if present, could last for the duration of the local anesthetic.  Onset: Full effect within 15 minutes of injected.  Duration: It will depend on the type of local anesthetic used. On the average, 1 to 8 hours.   From steroids (Applies only if steroids were used): Decrease in swelling or inflammation. Once inflammation is improved, relief of the pain will follow.  Onset of benefits: Depends on the amount of swelling present. The more swelling, the longer it will take for the benefits to be seen. In some cases, up to 10 days.    Duration: Steroids will stay in the system x 2 weeks. Duration of benefits will depend on multiple posibilities including persistent irritating factors.  Side-effects: If present, they may typically last 2 weeks (the duration of the steroids).  Frequent: Cramps (if they occur, drink Gatorade and take over-the-counter Magnesium 450-500 mg  once to twice a day); water retention with temporary weight gain; increases in blood sugar; decreased immune system response; increased appetite.  Occasional: Facial flushing (red, warm cheeks); mood swings; menstrual changes.  Uncommon: Long-term decrease or suppression of natural hormones; bone thinning. (These are more common with higher doses or more frequent use. This is why we prefer that our patients avoid having any injection therapies in other practices.)   Very Rare: Severe mood changes; psychosis; aseptic necrosis.  From procedure: Some discomfort is to be expected once the numbing medicine wears off. This should be minimal if ice and heat are applied as instructed.  Call if: (When should I call?)  You experience numbness and weakness that gets worse with time, as opposed to wearing off.  New onset bowel or bladder incontinence. (Applies only to procedures done in the spine)  Emergency Numbers:  Durning business hours (Monday - Thursday, 8:00 AM - 4:00 PM) (Friday, 9:00 AM - 12:00 Noon): [phone]  After hours: [phone]  NOTE: If you are having a problem and are unable connect with, or to talk to a provider, then go to your nearest urgent care or emergency department. If the problem is serious and urgent, please call 911. ____________________________________________________________________________________________   Epidural Steroid Injection An epidural steroid injection is a shot of steroid medicine and numbing medicine that is given into the space between the spinal cord and the bones in your back (epidural space). The shot helps relieve pain caused by an irritated or swollen nerve root. The amount of pain relief you get from the injection depends on what is causing the nerve to be swollen and irritated, and how long your pain lasts. You are more likely to benefit from this injection if your pain is strong and comes on suddenly rather than if you have had pain for  a long time. Tell a health care provider about:   Any allergies you have.  All medicines you are taking, including vitamins, herbs, eye drops, creams, and over-the-counter medicines.  Any problems you or family members have had with anesthetic medicines.  Any blood disorders you have.  Any surgeries you have had.  Any medical conditions you have.  Whether you are pregnant or may be pregnant. What are the risks? Generally, this is a safe procedure. However, problems may occur, including:  Headache.  Bleeding.  Infection.  Allergic reaction to medicines.  Damage to your nerves. What happens before the procedure? Staying hydrated Follow instructions from your health care provider about hydration, which may include:  Up to 2 hours before the procedure - you may continue to drink clear liquids, such as water, clear fruit juice, black coffee, and plain tea. Eating and drinking restrictions Follow instructions from your health care provider about eating and drinking, which may include:  8 hours before the procedure - stop eating heavy meals or foods such as meat, fried foods, or fatty foods.  6 hours before the procedure - stop eating light meals or foods, such as toast or cereal.  6 hours before the procedure - stop drinking milk or drinks that contain milk.  2 hours before the procedure - stop drinking clear liquids. Medicine  You may be given medicines to lower anxiety.  Ask your health care provider about: ? Changing or stopping your regular medicines. This is especially important if you are taking diabetes medicines or blood thinners. ? Taking medicines such as aspirin and ibuprofen. These medicines can thin your blood. Do not take these medicines before your procedure if your health care provider instructs you not to. General instructions  Plan to have someone take you home from the hospital or clinic. What happens during the procedure?  You may receive a  medicine to help you relax (sedative).  You will be asked to lie on your abdomen.  The injection site will be cleaned.  A numbing medicine (local anesthetic) will be used to numb the injection site.  A needle will be inserted through your skin into the epidural space. You may feel some discomfort when this happens. An X-ray machine will be used to make sure the needle is put as close as possible to the affected nerve.  A steroid medicine and a local anesthetic will be injected into the epidural space.  The needle will be removed.  A bandage (dressing) will be put over the injection site. What happens after the procedure?  Your blood pressure, heart rate, breathing rate, and blood oxygen level will be monitored until the medicines you were given have worn off.  Your arm or leg may feel weak or numb for a few hours.  The injection site may feel sore.  Do not drive for 24 hours if you received a sedative. This information is not intended to replace advice given to you by your health care provider. Make sure you discuss any questions you have with your health care provider. Document Released: 04/11/2007 Document Revised: 12/15/2016 Document Reviewed: 04/20/2015 Elsevier Patient Education  2020 [employer].

## 2018-09-02 NOTE — Progress Notes (Signed)
Safety precautions to be maintained throughout the outpatient stay will include: orient to surroundings, keep bed in low position, maintain call bell within reach at all times, provide assistance with transfer out of bed and ambulation.  

## 2018-09-02 NOTE — Telephone Encounter (Signed)
Please review for refill. Thank you! 

## 2018-09-03 ENCOUNTER — Telehealth: Payer: Self-pay | Admitting: *Deleted

## 2018-09-03 NOTE — Telephone Encounter (Signed)
No problems post procedure. 

## 2018-09-09 ENCOUNTER — Telehealth: Payer: Self-pay | Admitting: Student in an Organized Health Care Education/Training Program

## 2018-09-09 NOTE — Telephone Encounter (Addendum)
Patient wants to go to [place] Fax# [phone] Phone [phone]. They can get much cheaper at [facility]. Can you cancel the one at [facility] and send order and schedule one at [facility]. Please let patient know.  Blanch Media is calling patient first.

## 2018-09-09 NOTE — Telephone Encounter (Signed)
Dr. Holley Raring, patients wife called asking to have MRI completed at [facility]. It will cost them a lot less if he can do this. Can you please cancel order for [facility] and put one in for [facility]? Thank you You

## 2018-09-10 NOTE — Telephone Encounter (Signed)
Blanch Media faxed orders to [facility]

## 2018-09-11 ENCOUNTER — Other Ambulatory Visit: Payer: Self-pay

## 2018-09-13 ENCOUNTER — Ambulatory Visit: Payer: 59

## 2018-09-25 ENCOUNTER — Telehealth: Payer: Self-pay

## 2018-09-29 ENCOUNTER — Other Ambulatory Visit: Payer: Self-pay | Admitting: Cardiovascular Disease

## 2018-09-29 ENCOUNTER — Other Ambulatory Visit: Payer: Self-pay | Admitting: Family Medicine

## 2018-09-30 NOTE — Telephone Encounter (Signed)
Requested medication (s) are due for refill today: yes  Requested medication (s) are on the active medication list: yes  Last refill:  08/04/2018  Future visit scheduled: yes  Notes to clinic: This refill cannot be delegated   Requested Prescriptions  Pending Prescriptions Disp Refills   cyclobenzaprine (FLEXERIL) 10 MG tablet [Pharmacy Med Name: CYCLOBENZAPRINE 10MG  TABLETS] 60 tablet 1    Sig: TAKE 1 TABLET(10 MG) BY MOUTH THREE TIMES DAILY AS NEEDED FOR MUSCLE SPASMS     Not Delegated - Analgesics:  Muscle Relaxants Failed - 09/29/2018  6:40 PM      Failed - This refill cannot be delegated      Passed - Valid encounter within last 6 months    Recent Outpatient Visits          2 months ago Annual physical exam   [facility] Volney American, PA-C   2 months ago Viral upper respiratory tract infection   [facility] [place], Jeannette How, MD   2 months ago Intervertebral disc disorder with radiculopathy of lumbosacral region   [place], Jeannette How, MD   9 months ago Anemia, unspecified type   [place], Jeannette How, MD   1 year ago Bright red rectal bleeding   [facility] Guadalupe Maple, MD      Future Appointments            In 3 months Orene Desanctis, Lilia Argue, [place], [place]

## 2018-10-04 ENCOUNTER — Telehealth: Payer: Self-pay

## 2018-10-04 NOTE — Telephone Encounter (Signed)
Called patient to let him know that he would need to keep his FU appt with Dr Holley Raring on 9/22 and then he will be able to talk with him and decide what procedure would be need. Patient with understanding.

## 2018-10-07 ENCOUNTER — Encounter: Payer: Self-pay | Admitting: Student in an Organized Health Care Education/Training Program

## 2018-10-08 ENCOUNTER — Encounter: Payer: Self-pay | Admitting: Student in an Organized Health Care Education/Training Program

## 2018-10-08 ENCOUNTER — Ambulatory Visit
Payer: 59 | Attending: Student in an Organized Health Care Education/Training Program | Admitting: Student in an Organized Health Care Education/Training Program

## 2018-10-08 ENCOUNTER — Other Ambulatory Visit: Payer: Self-pay

## 2018-10-08 DIAGNOSIS — M47816 Spondylosis without myelopathy or radiculopathy, lumbar region: Secondary | ICD-10-CM | POA: Insufficient documentation

## 2018-10-08 DIAGNOSIS — G8929 Other chronic pain: Secondary | ICD-10-CM | POA: Insufficient documentation

## 2018-10-08 DIAGNOSIS — M5136 Other intervertebral disc degeneration, lumbar region: Secondary | ICD-10-CM | POA: Diagnosis not present

## 2018-10-08 DIAGNOSIS — M5416 Radiculopathy, lumbar region: Secondary | ICD-10-CM | POA: Diagnosis not present

## 2018-10-08 DIAGNOSIS — M51369 Other intervertebral disc degeneration, lumbar region without mention of lumbar back pain or lower extremity pain: Secondary | ICD-10-CM | POA: Insufficient documentation

## 2018-10-08 NOTE — Progress Notes (Signed)
Pain Management Virtual Encounter Note - Virtual Visit via Telephone Telehealth (real-time audio visits between healthcare provider and patient).   Patient's Phone No. & Preferred Pharmacy:  [phone] (home); There is no such number on file (mobile).; (Preferred) [phone] [name] [name] [address] [facility] [address] [name] [address] Phone: [phone] Fax: [phone]    Pre-screening note:  Our staff contacted [name] and offered him an "in person", "face-to-face" appointment versus a telephone encounter. He indicated preferring the telephone encounter, at this time.   Reason for Virtual Visit: COVID-19*  Social distancing based on CDC and AMA recommendations.   I contacted [name] on 10/08/2018 via telephone.      I clearly identified myself as Wayne Santa, MD. I verified that I was speaking with the correct person using two identifiers (Name: [name], and date of birth: [birth year]).  Advanced Informed Consent I sought verbal advanced consent from [name] for virtual visit interactions. I informed [name] of possible security and privacy concerns, risks, and limitations associated with providing "not-in-person" medical evaluation and management services. I also informed [name] of the availability of "in-person" appointments. Finally, I informed him that there would be a charge for the virtual visit and that he could be  personally, fully or partially, financially responsible for it. [name] expressed understanding and agreed to proceed.   Historic Elements   [name] is a 63 y.o. year old, male patient evaluated today after his last encounter by our practice on 10/04/2018. [name]  has a past medical history of Cardiomyopathy (in setting of Afib), DJD (degenerative joint disease) of knee, History of kidney stones, Intervertebral disc disorder with  radiculopathy of lumbosacral region, Kidney stone (2012), Lower extremity edema, PAF (paroxysmal atrial fibrillation) ([name]), Pernicious anemia, Prostate cancer ([name]), Pulmonary nodule, right, and SVT (supraventricular tachycardia) ([name]). He also  has a past surgical history that includes Knee surgery; Gastric bypass (2002); prostate seeding; Carpal tunnel release (Right, 12/23/2014); Carpal tunnel release (Left, 01/06/2015); and Colonoscopy with propofol (N/A, 10/08/2017). [name] has a current medication list which includes the following prescription(s): amiodarone, amlodipine, benazepril, cyanocobalamin, cyclobenzaprine, ezetimibe, furosemide, gabapentin, metoprolol succinate, mirtazapine, rosuvastatin, tramadol, trazodone, and xarelto. He  reports that he quit smoking about 39 years ago. His smoking use included cigarettes. He has a 10.00 pack-year smoking history. He has quit using smokeless tobacco.  His smokeless tobacco use included snuff. He reports current alcohol use of about 12.0 standard drinks of alcohol per week. He reports that he does not use drugs. [name] has No Known Allergies.   HPI  Today, he is being contacted for a post-procedure assessment.  Evaluation of last interventional procedure  09/09/2018 Procedure:   Type: Diagnostic Inter-Laminar Epidural Steroid Injection  #1  Region: Lumbar Level: L4-5 Level. Laterality: Left-Sided          Influential Factors: Intra-procedural challenges: None observed.         Reported side-effects: None.        Post-procedural adverse reactions or complications: None reported         Sedation: Please see nurses note for DOS. When no sedatives are used, the analgesic levels obtained are directly associated to the effectiveness of the local anesthetics. However, when sedation is provided, the level of analgesia obtained during the initial 1 hour following the intervention, is believed to be  the result of a combination of factors. These  factors may include, but are not limited to: 1. The effectiveness of the local anesthetics used. 2. The effects of the analgesic(s) and/or anxiolytic(s) used. 3. The degree of discomfort experienced by the patient at the time of the procedure. 4. The patients ability and reliability in recalling and recording the events. 5. The presence and influence of possible secondary gains and/or psychosocial factors. Reported result: Relief experienced during the 1st hour after the procedure: 100 % (Ultra-Short Term Relief)            Interpretative annotation: Clinically appropriate result. Analgesia during this period is likely to be Local Anesthetic and/or IV Sedative (Analgesic/Anxiolytic) related.          Effects of local anesthetic: The analgesic effects attained during this period are directly associated to the localized infiltration of local anesthetics and therefore cary significant diagnostic value as to the etiological location, or anatomical origin, of the pain. Expected duration of relief is directly dependent on the pharmacodynamics of the local anesthetic used. Long-acting (4-6 hours) anesthetics used.  Reported result: Relief during the next 4 to 6 hour after the procedure: 100 % (Short-Term Relief)            Interpretative annotation: Clinically appropriate result. Analgesia during this period is likely to be Local Anesthetic-related.          Long-term benefit: Defined as the period of time past the expected duration of local anesthetics (1 hour for short-acting and 4-6 hours for long-acting). With the possible exception of prolonged sympathetic blockade from the local anesthetics, benefits during this period are typically attributed to, or associated with, other factors such as analgesic sensory neuropraxia, antiinflammatory effects, or beneficial biochemical changes provided by agents other than the local anesthetics.  Reported result: Extended relief following procedure: 75 % (Long-Term  Relief)            Interpretative annotation: Clinically appropriate result. Good relief. No permanent benefit expected. Inflammation plays a part in the etiology to the pain.          Laboratory Chemistry Profile (12 mo)  Renal: 08/13/2018: BUN 12; BUN/Creatinine Ratio 12; Creatinine, Ser 1.00  Lab Results  Component Value Date   GFRAA 92 08/13/2018   GFRNONAA 79 08/13/2018   Hepatic: 07/25/2018: Albumin 4.6 Lab Results  Component Value Date   AST 19 07/25/2018   ALT 19 07/25/2018   Other: No results found for requested labs within last 8760 hours. Note: Above Lab results reviewed.  Imaging  Last 90 days:  Dg Pain Clinic C-arm 1-60 Min No Report  Result Date: 09/02/2018 Fluoro was used, but no Radiologist interpretation will be provided. Please refer to "NOTES" tab for provider progress note.   Assessment  The primary encounter diagnosis was Chronic radicular lumbar pain. Diagnoses of Lumbar radiculopathy, Lumbar degenerative disc disease, Lumbar facet arthropathy, and Lumbar facet joint syndrome were also pertinent to this visit.  Plan of Care  I am having [name]. [name] maintain his gabapentin, furosemide, mirtazapine, benazepril, amiodarone, amLODipine, traZODone, ezetimibe, traMADol, cyanocobalamin, Xarelto, cyclobenzaprine, metoprolol succinate, and rosuvastatin.  [name] is a 63 year old male with history of morbid obesity, history of atrial fibrillation, SVT syndrome on Xarelto who follows up he had telephone call for postprocedural evaluation status post left L4-L5 ESI #2 on 09/02/2018.  Patient endorses significant pain relief in regards to his left radicular pain.  He notices improvement in sleeping and ambulation.  He states that the  results are starting to wear off and we discussed repeating epidural steroid injection #2.  Patient was also able to obtain his MRI.  Results show baseline narrowing of vertebral canal due to congenitally short pedicles, severe left  neuroforaminal narrowing at L4-L5, L5 pars ventricularis defect, anterolisthesis of L5 on S1, trace, right facet edema at L4-L5, right facet edema at L5-S1.  Patient has tried physical therapy in the past.  He is currently on gabapentin 400 mg 3 times a day along with tramadol.  This is managed by his primary care provider.  I am assisting with interventional pain therapies for him.  These are helping to enhance his functional status.  Plan: -Plan for repeat left L4-L5 ESI #2, increased volume -Consideration of diagnostic lumbar facet medial branch nerve blocks at L3, L4, L5, S1 with possible consideration of lumbar radiofrequency ablation -Patient instructed to stop Xarelto 3 days prior to scheduled procedure.  Pharmacotherapy (Medications Ordered): No orders of the defined types were placed in this encounter.  Orders:  Orders Placed This Encounter  Procedures  . Lumbar Epidural Injection    Standing Status:   Future    Standing Expiration Date:   11/07/2018    Scheduling Instructions:     Procedure: Interlaminar Lumbar Epidural Steroid injection (LESI)            Laterality: Midline     Sedation: WITHOUT     Timeframe: ASAA     Patient instructed to stop Xarelto 3 days prior to scheduled procedure.    Order Specific Question:   Where will this procedure be performed?    Answer:   [facility]   Follow-up plan:   Return in about 2 weeks (around 10/22/2018) for Procedure L4-L5 ESI #2, stop Xarelto 3 days prior, without sedation.     s/p lesi #1 on 8/17 helpful, plan on repeating.  Lumbar MRI shows L4-L5 neuroforaminal stenosis as well as lumbar degenerative disc disease and L4-L5 and L5-S1 facet arthropathy.    Recent Visits Date Type Provider Dept  09/02/18 Procedure visit Wayne Santa, MD [facility]  08/13/18 Office Visit Wayne Santa, MD [facility]  Showing recent visits within past 90 days and meeting all other requirements   Today's Visits Date  Type Provider Dept  10/08/18 Office Visit Wayne Santa, MD [facility]  Showing today's visits and meeting all other requirements   Future Appointments No visits were found meeting these conditions.  Showing future appointments within next 90 days and meeting all other requirements   I discussed the assessment and treatment plan with the patient. The patient was provided an opportunity to ask questions and all were answered. The patient agreed with the plan and demonstrated an understanding of the instructions.  Patient advised to call back or seek an in-person evaluation if the symptoms or condition worsens.  Total duration of non-face-to-face encounter: 15 minutes.  Note by: Wayne Santa, MD Date: 10/08/2018; Time: 8:36 AM  Note: This dictation was prepared with Dragon dictation. Any transcriptional errors that may result from this process are unintentional.  Disclaimer:  * Given the special circumstances of the COVID-19 pandemic, the federal government has announced that the Office for Civil Rights (OCR) will exercise its enforcement discretion and will not impose penalties on physicians using telehealth in the event of noncompliance with regulatory requirements under the [place] and [place] (HIPAA) in connection with the good faith provision of telehealth during the XX123456 national public health emergency. (  AMA)

## 2018-10-21 ENCOUNTER — Other Ambulatory Visit: Payer: Self-pay

## 2018-10-21 ENCOUNTER — Encounter: Payer: Self-pay | Admitting: Student in an Organized Health Care Education/Training Program

## 2018-10-21 ENCOUNTER — Ambulatory Visit (HOSPITAL_BASED_OUTPATIENT_CLINIC_OR_DEPARTMENT_OTHER): Payer: 59 | Admitting: Student in an Organized Health Care Education/Training Program

## 2018-10-21 ENCOUNTER — Ambulatory Visit
Admission: RE | Admit: 2018-10-21 | Discharge: 2018-10-21 | Disposition: A | Discharge status: Home / Self Care | Payer: 59 | Source: Ambulatory Visit | Attending: Student in an Organized Health Care Education/Training Program | Admitting: Student in an Organized Health Care Education/Training Program

## 2018-10-21 DIAGNOSIS — F4542 Pain disorder with related psychological factors: Secondary | ICD-10-CM | POA: Diagnosis not present

## 2018-10-21 DIAGNOSIS — Z7901 Long term (current) use of anticoagulants: Secondary | ICD-10-CM | POA: Diagnosis not present

## 2018-10-21 DIAGNOSIS — M5416 Radiculopathy, lumbar region: Secondary | ICD-10-CM | POA: Insufficient documentation

## 2018-10-21 DIAGNOSIS — Z01818 Encounter for other preprocedural examination: Secondary | ICD-10-CM | POA: Diagnosis present

## 2018-10-21 DIAGNOSIS — M545 Low back pain: Secondary | ICD-10-CM | POA: Diagnosis not present

## 2018-10-21 DIAGNOSIS — G8929 Other chronic pain: Secondary | ICD-10-CM

## 2018-10-21 MED ORDER — IOHEXOL 180 MG/ML  SOLN
INTRAMUSCULAR | Status: AC
  Filled 2018-10-21: qty 20

## 2018-10-21 MED ORDER — LIDOCAINE HCL 2 % IJ SOLN
20.0000 mL | Freq: Once | INTRAMUSCULAR | Status: AC
  Administered 2018-10-21: 10:00:00 400 mg

## 2018-10-21 MED ORDER — DEXAMETHASONE SODIUM PHOSPHATE 10 MG/ML IJ SOLN
INTRAMUSCULAR | Status: AC
  Filled 2018-10-21: qty 1

## 2018-10-21 MED ORDER — DEXAMETHASONE SODIUM PHOSPHATE 10 MG/ML IJ SOLN
10.0000 mg | Freq: Once | INTRAMUSCULAR | Status: AC
  Administered 2018-10-21: 10 mg

## 2018-10-21 MED ORDER — SODIUM CHLORIDE (PF) 0.9 % IJ SOLN
INTRAMUSCULAR | Status: AC
  Filled 2018-10-21: qty 10

## 2018-10-21 MED ORDER — IOHEXOL 180 MG/ML  SOLN
10.0000 mL | Freq: Once | INTRAMUSCULAR | Status: AC
  Administered 2018-10-21: 10 mL via EPIDURAL

## 2018-10-21 MED ORDER — SODIUM CHLORIDE 0.9% FLUSH
2.0000 mL | Freq: Once | INTRAVENOUS | Status: AC
  Administered 2018-10-21: 2 mL

## 2018-10-21 MED ORDER — LIDOCAINE HCL 2 % IJ SOLN
INTRAMUSCULAR | Status: AC
  Filled 2018-10-21: qty 20

## 2018-10-21 MED ORDER — ROPIVACAINE HCL 2 MG/ML IJ SOLN
INTRAMUSCULAR | Status: AC
  Filled 2018-10-21: qty 10

## 2018-10-21 MED ORDER — ROPIVACAINE HCL 2 MG/ML IJ SOLN
2.0000 mL | Freq: Once | INTRAMUSCULAR | Status: AC
  Administered 2018-10-21: 2 mL via EPIDURAL

## 2018-10-21 NOTE — Progress Notes (Signed)
Safety precautions to be maintained throughout the outpatient stay will include: orient to surroundings, keep bed in low position, maintain call bell within reach at all times, provide assistance with transfer out of bed and ambulation.  

## 2018-10-21 NOTE — Patient Instructions (Signed)

## 2018-10-21 NOTE — Progress Notes (Signed)
Patient's Name: MD. HOOS  MRN: [id number]  Referring Provider: Gillis Santa, MD  DOB: 10/27/1955  PCP: Guadalupe Maple, MD  DOS: 10/21/2018  Note by: Gillis Santa, MD  Service setting: Ambulatory outpatient  Specialty: Interventional Pain Management  Patient type: Established  Location: [facility] (AMB) Pain Management Facility  Visit type: Interventional Procedure   Primary Reason for Visit: Interventional Pain Management Treatment. CC: Back Pain (lower left )  Procedure:          Anesthesia, Analgesia, Anxiolysis:  Type: Therapeutic Inter-Laminar Epidural Steroid Injection  #2  Region: Lumbar Level: L3-4 Level. Laterality: Left-Sided         Type: Local Anesthesia  Local Anesthetic: Lidocaine 1-2%  Position: Prone with head of the table was raised to facilitate breathing.   Indications: 1. Chronic radicular lumbar pain    Pain Score: Pre-procedure: 4 /10 Post-procedure: 2 /10   Patient's last dose of Xarelto was Wed.  Pre-op Assessment:  Mr. Mench is a 63 y.o. (year old), male patient, seen today for interventional treatment. He  has a past surgical history that includes Knee surgery; Gastric bypass (2002); prostate seeding; Carpal tunnel release (Right, 12/23/2014); Carpal tunnel release (Left, 01/06/2015); and Colonoscopy with propofol (N/A, 10/08/2017). Mr. Stroud has a current medication list which includes the following prescription(s): amiodarone, amlodipine, benazepril, cyanocobalamin, cyclobenzaprine, ezetimibe, gabapentin, metoprolol succinate, mirtazapine, rosuvastatin, tramadol, trazodone, xarelto, and furosemide. His primarily concern today is the Back Pain (lower left )  Initial Vital Signs:  Pulse/HCG Rate: 83  Temp: 98.3 F (36.8 C) Resp: 16 BP: 105/60 SpO2: 97 %  BMI: Estimated body mass index is 48.82 kg/m as calculated from the following:   Height as of this encounter: 6' (1.829 m).   Weight as of this encounter: 360 lb (163.3 kg).  Risk  Assessment: Allergies: Reviewed. He has No Known Allergies.  Allergy Precautions: None required Coagulopathies: Reviewed. None identified.  Blood-thinner therapy: None at this time Active Infection(s): Reviewed. None identified. Mr. Kise is afebrile  Site Confirmation: Mr. Venables was asked to confirm the procedure and laterality before marking the site Procedure checklist: Completed Consent: Before the procedure and under the influence of no sedative(s), amnesic(s), or anxiolytics, the patient was informed of the treatment options, risks and possible complications. To fulfill our ethical and legal obligations, as recommended by the American Medical Association's Code of Ethics, I have informed the patient of my clinical impression; the nature and purpose of the treatment or procedure; the risks, benefits, and possible complications of the intervention; the alternatives, including doing nothing; the risk(s) and benefit(s) of the alternative treatment(s) or procedure(s); and the risk(s) and benefit(s) of doing nothing. The patient was provided information about the general risks and possible complications associated with the procedure. These may include, but are not limited to: failure to achieve desired goals, infection, bleeding, organ or nerve damage, allergic reactions, paralysis, and death. In addition, the patient was informed of those risks and complications associated to Spine-related procedures, such as failure to decrease pain; infection (i.e.: Meningitis, epidural or intraspinal abscess); bleeding (i.e.: epidural hematoma, subarachnoid hemorrhage, or any other type of intraspinal or peri-dural bleeding); organ or nerve damage (i.e.: Any type of peripheral nerve, nerve root, or spinal cord injury) with subsequent damage to sensory, motor, and/or autonomic systems, resulting in permanent pain, numbness, and/or weakness of one or several areas of the body; allergic reactions; (i.e.: anaphylactic  reaction); and/or death. Furthermore, the patient was informed of those risks and complications associated with  the medications. These include, but are not limited to: allergic reactions (i.e.: anaphylactic or anaphylactoid reaction(s)); adrenal axis suppression; blood sugar elevation that in diabetics may result in ketoacidosis or comma; water retention that in patients with history of congestive heart failure may result in shortness of breath, pulmonary edema, and decompensation with resultant heart failure; weight gain; swelling or edema; medication-induced neural toxicity; particulate matter embolism and blood vessel occlusion with resultant organ, and/or nervous system infarction; and/or aseptic necrosis of one or more joints. Finally, the patient was informed that Medicine is not an exact science; therefore, there is also the possibility of unforeseen or unpredictable risks and/or possible complications that may result in a catastrophic outcome. The patient indicated having understood very clearly. We have given the patient no guarantees and we have made no promises. Enough time was given to the patient to ask questions, all of which were answered to the patient's satisfaction. Mr. Tudisco has indicated that he wanted to continue with the procedure. Attestation: I, the ordering provider, attest that I have discussed with the patient the benefits, risks, side-effects, alternatives, likelihood of achieving goals, and potential problems during recovery for the procedure that I have provided informed consent. Date  Time: 10/21/2018  9:01 AM  Pre-Procedure Preparation:  Monitoring: As per clinic protocol. Respiration, ETCO2, SpO2, BP, heart rate and rhythm monitor placed and checked for adequate function Safety Precautions: Patient was assessed for positional comfort and pressure points before starting the procedure. Time-out: I initiated and conducted the "Time-out" before starting the procedure, as per  protocol. The patient was asked to participate by confirming the accuracy of the "Time Out" information. Verification of the correct person, site, and procedure were performed and confirmed by me, the nursing staff, and the patient. "Time-out" conducted as per Joint Commission's Universal Protocol (UP.01.01.01). Time: 0946  Description of Procedure:          Target Area: The interlaminar space, initially targeting the lower laminar border of the superior vertebral body. Approach: Paramedial approach. Area Prepped: Entire Posterior Lumbar Region Prepping solution: DuraPrep (Iodine Povacrylex [0.7% available iodine] and Isopropyl Alcohol, 74% w/w) Safety Precautions: Aspiration looking for blood return was conducted prior to all injections. At no point did we inject any substances, as a needle was being advanced. No attempts were made at seeking any paresthesias. Safe injection practices and needle disposal techniques used. Medications properly checked for expiration dates. SDV (single dose vial) medications used. Description of the Procedure: Protocol guidelines were followed. The procedure needle was introduced through the skin, ipsilateral to the reported pain, and advanced to the target area. Bone was contacted and the needle walked caudad, until the lamina was cleared. The epidural space was identified using "loss-of-resistance technique" with 2-3 ml of PF-NaCl (0.9% NSS), in a 5cc LOR glass syringe.  Vitals:   10/21/18 0924 10/21/18 0945  BP: 105/60 118/76  Pulse: 83 77  Resp: 16 16  Temp: 98.3 F (36.8 C)   TempSrc: Oral   SpO2: 97% 97%  Weight: (!) 360 lb (163.3 kg)   Height: 6' (1.829 m)     Start Time: 0946 hrs. End Time: 0952 hrs.  Materials:  Needle(s) Type: Epidural needle Gauge: 17G Length: 5-in Medication(s): Please see orders for medications and dosing details. 9 cc solution made of 5 cc of preservative-free saline, 3 cc of 0.2% ropivacaine, 1 cc of Decadron 10  mg/cc.  Imaging Guidance (Spinal):          Type of Imaging Technique: Fluoroscopy  Guidance (Spinal) Indication(s): Assistance in needle guidance and placement for procedures requiring needle placement in or near specific anatomical locations not easily accessible without such assistance. Exposure Time: Please see nurses notes. Contrast: Before injecting any contrast, we confirmed that the patient did not have an allergy to iodine, shellfish, or radiological contrast. Once satisfactory needle placement was completed at the desired level, radiological contrast was injected. Contrast injected under live fluoroscopy. No contrast complications. See chart for type and volume of contrast used. Fluoroscopic Guidance: I was personally present during the use of fluoroscopy. "Tunnel Vision Technique" used to obtain the best possible view of the target area. Parallax error corrected before commencing the procedure. "Direction-depth-direction" technique used to introduce the needle under continuous pulsed fluoroscopy. Once target was reached, antero-posterior, oblique, and lateral fluoroscopic projection used confirm needle placement in all planes. Images permanently stored in EMR. Interpretation: I personally interpreted the imaging intraoperatively. Adequate needle placement confirmed in multiple planes. Appropriate spread of contrast into desired area was observed. No evidence of afferent or efferent intravascular uptake. No intrathecal or subarachnoid spread observed. Permanent images saved into the patient's record.  Antibiotic Prophylaxis:   Anti-infectives (From admission, onward)   None     Indication(s): None identified  Post-operative Assessment:  Post-procedure Vital Signs:  Pulse/HCG Rate: 77  Temp: 98.3 F (36.8 C) Resp: 16 BP: 118/76 SpO2: 97 %  EBL: None  Complications: No immediate post-treatment complications observed by team, or reported by patient.  Note: The patient tolerated  the entire procedure well. A repeat set of vitals were taken after the procedure and the patient was kept under observation following institutional policy, for this type of procedure. Post-procedural neurological assessment was performed, showing return to baseline, prior to discharge. The patient was provided with post-procedure discharge instructions, including a section on how to identify potential problems. Should any problems arise concerning this procedure, the patient was given instructions to immediately contact us, at any time, without hesitation. In any case, we plan to contact the patient by telephone for a follow-up status report regarding this interventional procedure.  Comments:  No additional relevant information. 5 out of 5 strength bilateral lower extremity: Plantar flexion, dorsiflexion, knee flexion, knee extension.  Plan of Care  Orders:  Orders Placed This Encounter  Procedures  . DG PAIN CLIN[facility] C-ARM 1-60 MIN NO REPORT    Intraoperative interpretation by procedural physician at [facility].    Standing Status:   Standing    Number of Occurrences:   1    Order Specific Question:   Reason for exam:    Answer:   Assistance in needle guidance and placement for procedures requiring needle placement in or near specific anatomical locations not easily accessible without such assistance.   Patient instructed to restart Xarelto tomorrow so long as he is not having any lower extremity weakness after nursing phone call.  Medications ordered for procedure: Meds ordered this encounter  Medications  . iohexol (OMNIPAQUE) 180 MG/ML injection 10 mL    Must be Myelogram-compatible. If not available, you may substitute with a water-soluble, non-ionic, hypoallergenic, myelogram-compatible radiological contrast medium.  Marland Kitchen lidocaine (XYLOCAINE) 2 % (with pres) injection 400 mg  . ropivacaine (PF) 2 mg/mL (0.2%) (NAROPIN) injection 2 mL  . sodium chloride flush (NS) 0.9 %  injection 2 mL  . dexamethasone (DECADRON) injection 10 mg   Medications administered: We administered iohexol, lidocaine, ropivacaine (PF) 2 mg/mL (0.2%), sodium chloride flush, and dexamethasone.  See the medical record for  exact dosing, route, and time of administration.  Follow-up plan:   Return in about 4 weeks (around 11/18/2018) for Post Procedure Evaluation, virtual.      s/p lesi #1 (left L4/5) on 8/17, #2 on 10/21/2018 (left L3/4)   Recent Visits Date Type Provider Dept  10/08/18 Office Visit Gillis Santa, MD [facility]  09/02/18 Procedure visit Gillis Santa, MD [facility]  08/13/18 Office Visit Gillis Santa, MD [facility]  Showing recent visits within past 90 days and meeting all other requirements   Today's Visits Date Type Provider Dept  10/21/18 Procedure visit Gillis Santa, MD [facility]  Showing today's visits and meeting all other requirements   Future Appointments Date Type Provider Dept  11/26/18 Appointment Gillis Santa, MD [facility]  Showing future appointments within next 90 days and meeting all other requirements   Disposition: Discharge home  Discharge Date & Time: 10/21/2018; 1000 hrs.   Primary Care Physician: Guadalupe Maple, MD Location[facility] Note by: Gillis Santa, MD Date: 10/21/2018; Time: 10:34 AM  Disclaimer:  Medicine is not an exact science. The only guarantee in medicine is that nothing is guaranteed. It is important to note that the decision to proceed with this intervention was based on the information collected from the patient. The Data and conclusions were drawn from the patient's questionnaire, the interview, and the physical examination. Because the information was provided in large part by the patient, it cannot be guaranteed that it has not been purposely or unconsciously manipulated. Every effort has been made to obtain as much relevant data as  possible for this evaluation. It is important to note that the conclusions that lead to this procedure are derived in large part from the available data. Always take into account that the treatment will also be dependent on availability of resources and existing treatment guidelines, considered by other Pain Management Practitioners as being common knowledge and practice, at the time of the intervention. For Medico-Legal purposes, it is also important to point out that variation in procedural techniques and pharmacological choices are the acceptable norm. The indications, contraindications, technique, and results of the above procedure should only be interpreted and judged by a Board-Certified Interventional Pain Specialist with extensive familiarity and expertise in the same exact procedure and technique.

## 2018-11-18 ENCOUNTER — Other Ambulatory Visit: Payer: Self-pay | Admitting: Cardiovascular Disease

## 2018-11-18 DIAGNOSIS — I480 Paroxysmal atrial fibrillation: Secondary | ICD-10-CM

## 2018-11-19 NOTE — Telephone Encounter (Signed)
Refill request

## 2018-11-19 NOTE — Telephone Encounter (Signed)
Prescription refill request for Xarelto received.   Last office visit: 11-30-2017, [place]  Weight: 152 kg Age: 63 y.o. Scr: 1.16 CrCl: 136 ml/min   Prescription refill sent.

## 2018-11-21 ENCOUNTER — Telehealth: Payer: Self-pay | Admitting: Pain Medicine

## 2018-11-21 NOTE — Telephone Encounter (Signed)
Patient lvmail at 3:18 11-21-18 asking to be called back about a conversation he had earlier. Did not say who he was talking to.   Left 2 numbers to be reached. I tried to call him back on both numbers, no answer.  [phone]

## 2018-11-22 NOTE — Telephone Encounter (Signed)
Attempted to call patient back.  Left message to call office.

## 2018-11-22 NOTE — Telephone Encounter (Signed)
No need to call patient back, I spoke with him this morning and he just wanted to know how Dr. Holley Raring would contact him for his virtual appt.

## 2018-11-26 ENCOUNTER — Encounter: Payer: Self-pay | Admitting: Student in an Organized Health Care Education/Training Program

## 2018-11-26 ENCOUNTER — Other Ambulatory Visit: Payer: Self-pay

## 2018-11-26 ENCOUNTER — Ambulatory Visit
Payer: 59 | Attending: Student in an Organized Health Care Education/Training Program | Admitting: Student in an Organized Health Care Education/Training Program

## 2018-11-26 DIAGNOSIS — M5416 Radiculopathy, lumbar region: Secondary | ICD-10-CM

## 2018-11-26 DIAGNOSIS — M5136 Other intervertebral disc degeneration, lumbar region: Secondary | ICD-10-CM

## 2018-11-26 DIAGNOSIS — G8929 Other chronic pain: Secondary | ICD-10-CM

## 2018-11-26 DIAGNOSIS — M47816 Spondylosis without myelopathy or radiculopathy, lumbar region: Secondary | ICD-10-CM | POA: Diagnosis not present

## 2018-11-26 DIAGNOSIS — I480 Paroxysmal atrial fibrillation: Secondary | ICD-10-CM

## 2018-11-26 DIAGNOSIS — G894 Chronic pain syndrome: Secondary | ICD-10-CM

## 2018-11-26 NOTE — Progress Notes (Signed)
Pain Management Virtual Encounter Note - Virtual Visit via [place] (real-time audio visits between healthcare provider and patient).   Patient's Phone No. & Preferred Pharmacy:  [phone] (home); There is no such number on file (mobile).; (Preferred) [phone] [name] [place], [place] [facility] [address] [place] [address] Phone: [phone] Fax: [phone]    Pre-screening note:  Our staff contacted [name] and offered him an "in person", "face-to-face" appointment versus a telephone encounter. He indicated preferring the telephone encounter, at this time.   Reason for Virtual Visit: COVID-19*  Social distancing based on CDC and AMA recommendations.   I contacted [name] on 11/26/2018 via video conference.      I clearly identified myself as Gillis Santa, MD. I verified that I was speaking with the correct person using two identifiers (Name: [name], and date of birth: [birth year]).  Advanced Informed Consent I sought verbal advanced consent from [name] for virtual visit interactions. I informed [name] of possible security and privacy concerns, risks, and limitations associated with providing "not-in-person" medical evaluation and management services. I also informed [name] of the availability of "in-person" appointments. Finally, I informed him that there would be a charge for the virtual visit and that he could be  personally, fully or partially, financially responsible for it. [name] expressed understanding and agreed to proceed.   Historic Elements   [name] is a 63 y.o. year old, male patient evaluated today after his last encounter by our practice on 11/21/2018. [name]  has a past medical history of Cardiomyopathy (in setting of Afib), DJD (degenerative joint disease) of knee, History of kidney stones, Intervertebral disc  disorder with radiculopathy of lumbosacral region, Kidney stone (2012), Lower extremity edema, PAF (paroxysmal atrial fibrillation) ([place]), Pernicious anemia, Prostate cancer ([place]), Pulmonary nodule, right, and SVT (supraventricular tachycardia) ([place]). He also  has a past surgical history that includes Knee surgery; Gastric bypass (2002); prostate seeding; Carpal tunnel release (Right, 12/23/2014); Carpal tunnel release (Left, 01/06/2015); and Colonoscopy with propofol (N/A, 10/08/2017). [name] has a current medication list which includes the following prescription(s): amiodarone, amlodipine, benazepril, cyanocobalamin, cyclobenzaprine, ezetimibe, gabapentin, metoprolol succinate, mirtazapine, rosuvastatin, tramadol, trazodone, xarelto, and furosemide. He  reports that he quit smoking about 39 years ago. His smoking use included cigarettes. He has a 10.00 pack-year smoking history. He has quit using smokeless tobacco.  His smokeless tobacco use included snuff. He reports current alcohol use of about 12.0 standard drinks of alcohol per week. He reports that he does not use drugs. [name] has No Known Allergies.   HPI  Today, he is being contacted for a post-procedure assessment.  Evaluation of last interventional procedure  10/21/2018 Procedure:   Type: Therapeutic Inter-Laminar Epidural Steroid Injection  #2  Region: Lumbar Level: L3-4 Level. Laterality: Left-Sided         Sedation: Please see nurses note for DOS. When no sedatives are used, the analgesic levels obtained are directly associated to the effectiveness of the local anesthetics. However, when sedation is provided, the level of analgesia obtained during the initial 1 hour following the intervention, is believed to be the result of a combination of factors. These factors may include, but are not limited to: 1. The effectiveness of the local anesthetics used. 2. The effects of the analgesic(s) and/or anxiolytic(s) used. 3. The degree of  discomfort experienced by the patient at the time of the procedure. 4. The patients ability and reliability in recalling and recording the events. 5. The presence and influence of possible secondary gains and/or psychosocial factors. Reported result: Relief experienced during the 1st hour after the procedure: 100 % (Ultra-Short Term Relief)            Interpretative annotation: Clinically appropriate result. Analgesia during this period is likely to be Local Anesthetic and/or IV Sedative (Analgesic/Anxiolytic) related.          Effects of local anesthetic: The analgesic effects attained during this period are directly associated to the localized infiltration of local anesthetics and therefore cary significant diagnostic value as to the etiological location, or anatomical origin, of the pain. Expected duration of relief is directly dependent on the pharmacodynamics of the local anesthetic used. Long-acting (4-6 hours) anesthetics used.  Reported result: Relief during the next 4 to 6 hour after the procedure: 100 % (Short-Term Relief)            Interpretative annotation: Clinically appropriate result. Analgesia during this period is likely to be Local Anesthetic-related.          Long-term benefit: Defined as the period of time past the expected duration of local anesthetics (1 hour for short-acting and 4-6 hours for long-acting). With the possible exception of prolonged sympathetic blockade from the local anesthetics, benefits during this period are typically attributed to, or associated with, other factors such as analgesic sensory neuropraxia, antiinflammatory effects, or beneficial biochemical changes provided by agents other than the local anesthetics.  Reported result: Extended relief following procedure: 90 %(lasting 4 weeks) (Long-Term Relief)            Interpretative annotation: Clinically appropriate result. Good relief. No permanent benefit expected. Inflammation plays a part in the etiology  to the pain.          However patient states that over the last 3 days, he has been having a lumbar radicular pain flare.  It is worse on his left side.  He has been bedbound for the last 2 days.  He even had to take off work.  He is interested in repeating another epidural steroid injection. Laboratory Chemistry Profile (12 mo)  Renal: 08/13/2018: BUN 12; BUN/Creatinine Ratio 12; Creatinine, Ser 1.00  Lab Results  Component Value Date   GFRAA 92 08/13/2018   GFRNONAA 79 08/13/2018   Hepatic: 07/25/2018: Albumin 4.6 Lab Results  Component Value Date   AST 19 07/25/2018   ALT 19 07/25/2018   Other: No results found for requested labs within last 8760 hours. Note: Above Lab results reviewed.  Imaging  Last 90 days:  Dg [facility] Clinic C-arm 1-60 Min No Report  Result Date: 10/21/2018 Fluoro was used, but no Radiologist interpretation will be provided. Please refer to "NOTES" tab for provider progress note.  Dg Pain Clinic C-arm 1-60 Min No Report  Result Date: 09/02/2018 Fluoro was used, but no Radiologist interpretation will be provided. Please refer to "NOTES" tab for provider progress note.   Assessment  The primary encounter diagnosis was Chronic radicular lumbar pain. Diagnoses of Lumbar radiculopathy, Lumbar degenerative disc disease, Lumbar facet arthropathy, Lumbar facet joint syndrome, Morbid obesity ([place]), Paroxysmal atrial fibrillation ([place]), and Chronic pain syndrome were also pertinent to this visit.  Plan of Care  I am having Wayne Rocher. Hill maintain his gabapentin, furosemide, mirtazapine, benazepril, amiodarone, amLODipine, traZODone, traMADol, cyanocobalamin, cyclobenzaprine, metoprolol succinate, rosuvastatin, Xarelto, and ezetimibe.  Return for lumbar  ESI #3, left L3-L4.  Patient instructed to stop Xarelto 3 days prior. Orders:  Orders Placed This Encounter  Procedures  . LESI (Schedule)    Standing Status:   Future    Standing Expiration Date:   12/26/2018     Scheduling Instructions:     Left L3-L4 epidural steroid injection without sedation.  Patient to stop Xarelto 3 days prior    Order Specific Question:   Where will this procedure be performed?    Answer:   [facility]   Follow-up plan:   Return in about 1 week (around 12/03/2018) for Procedure left L3-L4 ESI #3 without sedation, patient to stop Xarelto 3 days prior.     s/p lesi #1 (left L4/5) on 8/17, #2 on 10/21/2018 (left L3/4)    Recent Visits Date Type Provider Dept  10/21/18 Procedure visit Gillis Santa, MD [facility]  10/08/18 Office Visit Gillis Santa, MD [facility]  09/02/18 Procedure visit Gillis Santa, MD [facility]  Showing recent visits within past 90 days and meeting all other requirements   Today's Visits Date Type Provider Dept  11/26/18 Office Visit Gillis Santa, MD [facility]  Showing today's visits and meeting all other requirements   Future Appointments No visits were found meeting these conditions.  Showing future appointments within next 90 days and meeting all other requirements   I discussed the assessment and treatment plan with the patient. The patient was provided an opportunity to ask questions and all were answered. The patient agreed with the plan and demonstrated an understanding of the instructions.  Patient advised to call back or seek an in-person evaluation if the symptoms or condition worsens.  Total duration of non-face-to-face encounter: 36minutes.  Note by: Gillis Santa, MD Date: 11/26/2018; Time: 3:21 PM  Note: This dictation was prepared with Dragon dictation. Any transcriptional errors that may result from this process are unintentional.  Disclaimer:  * Given the special circumstances of the COVID-19 pandemic, the federal government has announced that the Office for Civil Rights (OCR) will exercise its enforcement discretion and will not impose penalties on physicians using telehealth  in the event of noncompliance with regulatory requirements under the [place] and [place] (HIPAA) in connection with the good faith provision of telehealth during the XX123456 national public health emergency. ([place])

## 2018-12-09 ENCOUNTER — Ambulatory Visit (HOSPITAL_BASED_OUTPATIENT_CLINIC_OR_DEPARTMENT_OTHER): Payer: 59 | Admitting: Student in an Organized Health Care Education/Training Program

## 2018-12-09 ENCOUNTER — Encounter: Payer: Self-pay | Admitting: Student in an Organized Health Care Education/Training Program

## 2018-12-09 ENCOUNTER — Other Ambulatory Visit: Payer: Self-pay

## 2018-12-09 ENCOUNTER — Ambulatory Visit
Admission: RE | Admit: 2018-12-09 | Discharge: 2018-12-09 | Disposition: A | Discharge status: Home / Self Care | Payer: 59 | Source: Ambulatory Visit | Attending: Student in an Organized Health Care Education/Training Program | Admitting: Student in an Organized Health Care Education/Training Program

## 2018-12-09 VITALS — BP 104/74 | HR 92 | Temp 96.4°F | Resp 16 | Ht 72.0 in | Wt 360.0 lb

## 2018-12-09 DIAGNOSIS — Z9884 Bariatric surgery status: Secondary | ICD-10-CM | POA: Diagnosis not present

## 2018-12-09 DIAGNOSIS — G8929 Other chronic pain: Secondary | ICD-10-CM | POA: Diagnosis not present

## 2018-12-09 DIAGNOSIS — M5416 Radiculopathy, lumbar region: Secondary | ICD-10-CM | POA: Insufficient documentation

## 2018-12-09 MED ORDER — SODIUM CHLORIDE (PF) 0.9 % IJ SOLN
INTRAMUSCULAR | Status: AC
  Filled 2018-12-09: qty 10

## 2018-12-09 MED ORDER — ROPIVACAINE HCL 2 MG/ML IJ SOLN
INTRAMUSCULAR | Status: AC
  Filled 2018-12-09: qty 10

## 2018-12-09 MED ORDER — LIDOCAINE HCL 2 % IJ SOLN
INTRAMUSCULAR | Status: AC
  Filled 2018-12-09: qty 20

## 2018-12-09 MED ORDER — DEXAMETHASONE SODIUM PHOSPHATE 10 MG/ML IJ SOLN
10.0000 mg | Freq: Once | INTRAMUSCULAR | Status: AC
  Administered 2018-12-09: 09:00:00 10 mg

## 2018-12-09 MED ORDER — SODIUM CHLORIDE 0.9% FLUSH
2.0000 mL | Freq: Once | INTRAVENOUS | Status: AC
  Administered 2018-12-09: 2 mL

## 2018-12-09 MED ORDER — DEXAMETHASONE SODIUM PHOSPHATE 10 MG/ML IJ SOLN
INTRAMUSCULAR | Status: AC
  Filled 2018-12-09: qty 1

## 2018-12-09 MED ORDER — IOHEXOL 180 MG/ML  SOLN
INTRAMUSCULAR | Status: AC
  Filled 2018-12-09: qty 20

## 2018-12-09 MED ORDER — IOHEXOL 180 MG/ML  SOLN
10.0000 mL | Freq: Once | INTRAMUSCULAR | Status: AC
  Administered 2018-12-09: 10 mL via EPIDURAL

## 2018-12-09 MED ORDER — LIDOCAINE HCL 2 % IJ SOLN
20.0000 mL | Freq: Once | INTRAMUSCULAR | Status: AC
  Administered 2018-12-09: 400 mg

## 2018-12-09 MED ORDER — ROPIVACAINE HCL 2 MG/ML IJ SOLN
2.0000 mL | Freq: Once | INTRAMUSCULAR | Status: AC
  Administered 2018-12-09: 2 mL via EPIDURAL

## 2018-12-09 NOTE — Progress Notes (Signed)
Patient's Name: [name]  MRN: [id number]  Referring Provider: Gillis Santa, MD  DOB: 1955/08/20  PCP: Guadalupe Maple, MD  DOS: 12/09/2018  Note by: Gillis Santa, MD  Service setting: Ambulatory outpatient  Specialty: Interventional Pain Management  Patient type: Established  Location: [facility] (AMB) Pain Management Facility  Visit type: Interventional Procedure   Primary Reason for Visit: Interventional Pain Management Treatment. CC: Back Pain (lower and into left leg )  Procedure:          Anesthesia, Analgesia, Anxiolysis:  Type: Therapeutic Inter-Laminar Epidural Steroid Injection  #3  Region: Lumbar Level: L3-4 Level. Laterality: Left-Sided         Type: Local Anesthesia  Local Anesthetic: Lidocaine 1-2%  Position: Prone with head of the table was raised to facilitate breathing.   Indications: 1. Chronic radicular lumbar pain   2. Lumbar radiculopathy    Pain Score: Pre-procedure: 9 /10 Post-procedure: 9 /10   Patient's last dose of Xarelto was 3 days ago  Pre-op Assessment:  Mr. Dolson is a 63 y.o. (year old), male patient, seen today for interventional treatment. He  has a past surgical history that includes Knee surgery; Gastric bypass (2002); prostate seeding; Carpal tunnel release (Right, 12/23/2014); Carpal tunnel release (Left, 01/06/2015); and Colonoscopy with propofol (N/A, 10/08/2017). Mr. Muskett has a current medication list which includes the following prescription(s): amiodarone, amlodipine, benazepril, cyanocobalamin, cyclobenzaprine, ezetimibe, gabapentin, metoprolol succinate, mirtazapine, rosuvastatin, tramadol, trazodone, xarelto, and furosemide. His primarily concern today is the Back Pain (lower and into left leg )  Initial Vital Signs:  Pulse/HCG Rate: 92ECG Heart Rate: 86 Temp: (!) 96.4 F (35.8 C) Resp: 16 BP: (!) 181/101 SpO2: 97 %  BMI: Estimated body mass index is 48.82 kg/m as calculated from the following:   Height as of this encounter: 6'  (1.829 m).   Weight as of this encounter: 360 lb (163.3 kg).  Risk Assessment: Allergies: Reviewed. He has No Known Allergies.  Allergy Precautions: None required Coagulopathies: Reviewed. None identified.  Blood-thinner therapy: None at this time Active Infection(s): Reviewed. None identified. Mr. Vanantwerp is afebrile  Site Confirmation: Mr. Goettl was asked to confirm the procedure and laterality before marking the site Procedure checklist: Completed Consent: Before the procedure and under the influence of no sedative(s), amnesic(s), or anxiolytics, the patient was informed of the treatment options, risks and possible complications. To fulfill our ethical and legal obligations, as recommended by the American Medical Association's Code of Ethics, I have informed the patient of my clinical impression; the nature and purpose of the treatment or procedure; the risks, benefits, and possible complications of the intervention; the alternatives, including doing nothing; the risk(s) and benefit(s) of the alternative treatment(s) or procedure(s); and the risk(s) and benefit(s) of doing nothing. The patient was provided information about the general risks and possible complications associated with the procedure. These may include, but are not limited to: failure to achieve desired goals, infection, bleeding, organ or nerve damage, allergic reactions, paralysis, and death. In addition, the patient was informed of those risks and complications associated to Spine-related procedures, such as failure to decrease pain; infection (i.e.: Meningitis, epidural or intraspinal abscess); bleeding (i.e.: epidural hematoma, subarachnoid hemorrhage, or any other type of intraspinal or peri-dural bleeding); organ or nerve damage (i.e.: Any type of peripheral nerve, nerve root, or spinal cord injury) with subsequent damage to sensory, motor, and/or autonomic systems, resulting in permanent pain, numbness, and/or weakness of one  or several areas of the body; allergic reactions; (  i.e.: anaphylactic reaction); and/or death. Furthermore, the patient was informed of those risks and complications associated with the medications. These include, but are not limited to: allergic reactions (i.e.: anaphylactic or anaphylactoid reaction(s)); adrenal axis suppression; blood sugar elevation that in diabetics may result in ketoacidosis or comma; water retention that in patients with history of congestive heart failure may result in shortness of breath, pulmonary edema, and decompensation with resultant heart failure; weight gain; swelling or edema; medication-induced neural toxicity; particulate matter embolism and blood vessel occlusion with resultant organ, and/or nervous system infarction; and/or aseptic necrosis of one or more joints. Finally, the patient was informed that Medicine is not an exact science; therefore, there is also the possibility of unforeseen or unpredictable risks and/or possible complications that may result in a catastrophic outcome. The patient indicated having understood very clearly. We have given the patient no guarantees and we have made no promises. Enough time was given to the patient to ask questions, all of which were answered to the patient's satisfaction. Mr. Leavelle has indicated that he wanted to continue with the procedure. Attestation: I, the ordering provider, attest that I have discussed with the patient the benefits, risks, side-effects, alternatives, likelihood of achieving goals, and potential problems during recovery for the procedure that I have provided informed consent. Date  Time:   Pre-Procedure Preparation:  Monitoring: As per clinic protocol. Respiration, ETCO2, SpO2, BP, heart rate and rhythm monitor placed and checked for adequate function Safety Precautions: Patient was assessed for positional comfort and pressure points before starting the procedure. Time-out: I initiated and conducted the  "Time-out" before starting the procedure, as per protocol. The patient was asked to participate by confirming the accuracy of the "Time Out" information. Verification of the correct person, site, and procedure were performed and confirmed by me, the nursing staff, and the patient. "Time-out" conducted as per Joint Commission's Universal Protocol (UP.01.01.01). Time: 0928  Description of Procedure:          Target Area: The interlaminar space, initially targeting the lower laminar border of the superior vertebral body. Approach: Paramedial approach. Area Prepped: Entire Posterior Lumbar Region Prepping solution: DuraPrep (Iodine Povacrylex [0.7% available iodine] and Isopropyl Alcohol, 74% w/w) Safety Precautions: Aspiration looking for blood return was conducted prior to all injections. At no point did we inject any substances, as a needle was being advanced. No attempts were made at seeking any paresthesias. Safe injection practices and needle disposal techniques used. Medications properly checked for expiration dates. SDV (single dose vial) medications used. Description of the Procedure: Protocol guidelines were followed. The procedure needle was introduced through the skin, ipsilateral to the reported pain, and advanced to the target area. Bone was contacted and the needle walked caudad, until the lamina was cleared. The epidural space was identified using "loss-of-resistance technique" with 2-3 ml of PF-NaCl (0.9% NSS), in a 5cc LOR glass syringe.  Vitals:   12/09/18 0841 12/09/18 0926 12/09/18 0931 12/09/18 0933  BP: (!) 181/101 122/75 112/77 104/74  Pulse: 92     Resp: 16 17 15 16   Temp: (!) 96.4 F (35.8 C)     TempSrc: Temporal     SpO2: 97% 96% 95% 95%  Weight: (!) 360 lb (163.3 kg)     Height: 6' (1.829 m)       Start Time: 0928 hrs. End Time: 0933 hrs.  Materials:  Needle(s) Type: Epidural needle Gauge: 17G Length: 5-in Medication(s): Please see orders for medications and  dosing details. 9 cc solution  made of 5 cc of preservative-free saline, 3 cc of 0.2% ropivacaine, 1 cc of Decadron 10 mg/cc.  Imaging Guidance (Spinal):          Type of Imaging Technique: Fluoroscopy Guidance (Spinal) Indication(s): Assistance in needle guidance and placement for procedures requiring needle placement in or near specific anatomical locations not easily accessible without such assistance. Exposure Time: Please see nurses notes. Contrast: Before injecting any contrast, we confirmed that the patient did not have an allergy to iodine, shellfish, or radiological contrast. Once satisfactory needle placement was completed at the desired level, radiological contrast was injected. Contrast injected under live fluoroscopy. No contrast complications. See chart for type and volume of contrast used. Fluoroscopic Guidance: I was personally present during the use of fluoroscopy. "Tunnel Vision Technique" used to obtain the best possible view of the target area. Parallax error corrected before commencing the procedure. "Direction-depth-direction" technique used to introduce the needle under continuous pulsed fluoroscopy. Once target was reached, antero-posterior, oblique, and lateral fluoroscopic projection used confirm needle placement in all planes. Images permanently stored in EMR. Interpretation: I personally interpreted the imaging intraoperatively. Adequate needle placement confirmed in multiple planes. Appropriate spread of contrast into desired area was observed. No evidence of afferent or efferent intravascular uptake. No intrathecal or subarachnoid spread observed. Permanent images saved into the patient's record.  Antibiotic Prophylaxis:   Anti-infectives (From admission, onward)   None     Indication(s): None identified  Post-operative Assessment:  Post-procedure Vital Signs:  Pulse/HCG Rate: 9276 Temp: (!) 96.4 F (35.8 C) Resp: 16 BP: 104/74 SpO2: 95 %  EBL:  None  Complications: No immediate post-treatment complications observed by team, or reported by patient.  Note: The patient tolerated the entire procedure well. A repeat set of vitals were taken after the procedure and the patient was kept under observation following institutional policy, for this type of procedure. Post-procedural neurological assessment was performed, showing return to baseline, prior to discharge. The patient was provided with post-procedure discharge instructions, including a section on how to identify potential problems. Should any problems arise concerning this procedure, the patient was given instructions to immediately contact us, at any time, without hesitation. In any case, we plan to contact the patient by telephone for a follow-up status report regarding this interventional procedure.  Comments:  No additional relevant information. 5 out of 5 strength bilateral lower extremity: Plantar flexion, dorsiflexion, knee flexion, knee extension.  Plan of Care  Orders:  Orders Placed This Encounter  Procedures  . Fluoro (C-Arm) (<60 min) (No Report)    Intraoperative interpretation by procedural physician at [place].    Standing Status:   Standing    Number of Occurrences:   1    Order Specific Question:   Reason for exam:    Answer:   Assistance in needle guidance and placement for procedures requiring needle placement in or near specific anatomical locations not easily accessible without such assistance.   Patient instructed to restart Xarelto tomorrow so long as he is not having any lower extremity weakness after nursing phone call.  Medications ordered for procedure: Meds ordered this encounter  Medications  . iohexol (OMNIPAQUE) 180 MG/ML injection 10 mL    Must be Myelogram-compatible. If not available, you may substitute with a water-soluble, non-ionic, hypoallergenic, myelogram-compatible radiological contrast medium.  Marland Kitchen lidocaine (XYLOCAINE) 2 %  (with pres) injection 400 mg  . ropivacaine (PF) 2 mg/mL (0.2%) (NAROPIN) injection 2 mL  . sodium chloride flush (NS) 0.9 %  injection 2 mL  . dexamethasone (DECADRON) injection 10 mg   Medications administered: We administered iohexol, lidocaine, ropivacaine (PF) 2 mg/mL (0.2%), sodium chloride flush, and dexamethasone.  See the medical record for exact dosing, route, and time of administration.  Follow-up plan:   Return in about 3 months (around 03/11/2019) for Post Procedure Evaluation, virtual.      s/p lesi #1 (left L4/5) on 8/17, #2 on 10/21/2018 (left L3/4), #3 on 12/09/2018 (Left L3/4)   Recent Visits Date Type Provider Dept  11/26/18 Office Visit Gillis Santa, MD [facility]  10/21/18 Procedure visit Gillis Santa, MD [facility]  10/08/18 Office Visit Gillis Santa, MD [facility]  Showing recent visits within past 90 days and meeting all other requirements   Today's Visits Date Type Provider Dept  12/09/18 Procedure visit Gillis Santa, MD [facility]  Showing today's visits and meeting all other requirements   Future Appointments No visits were found meeting these conditions.  Showing future appointments within next 90 days and meeting all other requirements   Disposition: Discharge home  Discharge Date & Time: 12/09/2018; 0945 hrs.   Primary Care Physician: Guadalupe Maple, MD Location: [facility] Note by: Gillis Santa, MD Date: 12/09/2018; Time: 9:42 AM  Disclaimer:  Medicine is not an exact science. The only guarantee in medicine is that nothing is guaranteed. It is important to note that the decision to proceed with this intervention was based on the information collected from the patient. The Data and conclusions were drawn from the patient's questionnaire, the interview, and the physical examination. Because the information was provided in large part by the patient, it cannot be guaranteed  that it has not been purposely or unconsciously manipulated. Every effort has been made to obtain as much relevant data as possible for this evaluation. It is important to note that the conclusions that lead to this procedure are derived in large part from the available data. Always take into account that the treatment Wayne also be dependent on availability of resources and existing treatment guidelines, considered by other Pain Management Practitioners as being common knowledge and practice, at the time of the intervention. For Medico-Legal purposes, it is also important to point out that variation in procedural techniques and pharmacological choices are the acceptable norm. The indications, contraindications, technique, and results of the above procedure should only be interpreted and judged by a Board-Certified Interventional Pain Specialist with extensive familiarity and expertise in the same exact procedure and technique.

## 2018-12-09 NOTE — Patient Instructions (Signed)
Pain Management Discharge Instructions  General Discharge Instructions :  If you need to reach your doctor call: Monday-Friday 8:00 am - 4:00 pm at [phone] or toll free [phone].  After clinic hours [phone] to have operator reach doctor.  Bring all of your medication bottles to all your appointments in the pain clinic.  To cancel or reschedule your appointment with Pain Management please remember to call 24 hours in advance to avoid a fee.  Refer to the educational materials which you have been given on: General Risks, I had my Procedure. Discharge Instructions, Post Sedation.  Post Procedure Instructions:  The drugs you were given will stay in your system until tomorrow, so for the next 24 hours you should not drive, make any legal decisions or drink any alcoholic beverages.  You may eat anything you prefer, but it is better to start with liquids then soups and crackers, and gradually work up to solid foods.  Please notify your doctor immediately if you have any unusual bleeding, trouble breathing or pain that is not related to your normal pain.  Depending on the type of procedure that was done, some parts of your body may feel week and/or numb.  This usually clears up by tonight or the next day.  Walk with the use of an assistive device or accompanied by an adult for the 24 hours.  You may use ice on the affected area for the first 24 hours.  Put ice in a Ziploc bag and cover with a towel and place against area 15 minutes on 15 minutes off.  You may switch to heat after 24 hours.Epidural Steroid Injection Patient Information  Description: The epidural space surrounds the nerves as they exit the spinal cord.  In some patients, the nerves can be compressed and inflamed by a bulging disc or a tight spinal canal (spinal stenosis).  By injecting steroids into the epidural space, we can bring irritated nerves into direct contact with a potentially helpful medication.  These  steroids act directly on the irritated nerves and can reduce swelling and inflammation which often leads to decreased pain.  Epidural steroids may be injected anywhere along the spine and from the neck to the low back depending upon the location of your pain.   After numbing the skin with local anesthetic (like Novocaine), a small needle is passed into the epidural space slowly.  You may experience a sensation of pressure while this is being done.  The entire block usually last less than 10 minutes.  Conditions which may be treated by epidural steroids:   Low back and leg pain  Neck and arm pain  Spinal stenosis  Post-laminectomy syndrome  Herpes zoster (shingles) pain  Pain from compression fractures  Preparation for the injection:  1. Do not eat any solid food or dairy products within 8 hours of your appointment.  2. You may drink clear liquids up to 3 hours before appointment.  Clear liquids include water, black coffee, juice or soda.  No milk or cream please. 3. You may take your regular medication, including pain medications, with a sip of water before your appointment  Diabetics should hold regular insulin (if taken separately) and take 1/2 normal NPH dos the morning of the procedure.  Carry some sugar containing items with you to your appointment. 4. A driver must accompany you and be prepared to drive you home after your procedure.  5. Bring all your current medications with your. 6. An IV may be inserted and   sedation may be given at the discretion of the physician.   7. A blood pressure cuff, EKG and other monitors will often be applied during the procedure.  Some patients may need to have extra oxygen administered for a short period. 8. You will be asked to provide medical information, including your allergies, prior to the procedure.  We must know immediately if you are taking blood thinners (like Coumadin/Warfarin)  Or if you are allergic to IV iodine contrast (dye). We must  know if you could possible be pregnant.  Possible side-effects:  Bleeding from needle site  Infection (rare, may require surgery)  Nerve injury (rare)  Numbness & tingling (temporary)  Difficulty urinating (rare, temporary)  Spinal headache ( a headache worse with upright posture)  Light -headedness (temporary)  Pain at injection site (several days)  Decreased blood pressure (temporary)  Weakness in arm/leg (temporary)  Pressure sensation in back/neck (temporary)  Call if you experience:  Fever/chills associated with headache or increased back/neck pain.  Headache worsened by an upright position.  New onset weakness or numbness of an extremity below the injection site  Hives or difficulty breathing (go to the emergency room)  Inflammation or drainage at the infection site  Severe back/neck pain  Any new symptoms which are concerning to you  Please note:  Although the local anesthetic injected can often make your back or neck feel good for several hours after the injection, the pain will likely return.  It takes 3-7 days for steroids to work in the epidural space.  You may not notice any pain relief for at least that one week.  If effective, we will often do a series of three injections spaced 3-6 weeks apart to maximally decrease your pain.  After the initial series, we generally will wait several months before considering a repeat injection of the same type.  If you have any questions, please call [phone] [facility] 

## 2018-12-10 ENCOUNTER — Telehealth: Payer: Self-pay

## 2018-12-10 NOTE — Telephone Encounter (Signed)
Post procedure phone call.  Patient states he is doing good.  

## 2019-01-05 ENCOUNTER — Other Ambulatory Visit: Payer: Self-pay | Admitting: Cardiovascular Disease

## 2019-01-05 DIAGNOSIS — I10 Essential (primary) hypertension: Secondary | ICD-10-CM

## 2019-01-05 DIAGNOSIS — I480 Paroxysmal atrial fibrillation: Secondary | ICD-10-CM

## 2019-01-06 ENCOUNTER — Other Ambulatory Visit: Payer: Self-pay

## 2019-01-06 DIAGNOSIS — M1731 Unilateral post-traumatic osteoarthritis, right knee: Secondary | ICD-10-CM

## 2019-01-06 MED ORDER — TRAMADOL HCL 50 MG PO TABS: 50.0000 mg | ORAL_TABLET | Freq: Two times a day (BID) | ORAL | 0 refills | Status: DC | PRN

## 2019-01-06 MED ORDER — CYCLOBENZAPRINE HCL 10 MG PO TABS: ORAL_TABLET | ORAL | 1 refills | Status: DC

## 2019-01-06 NOTE — Telephone Encounter (Signed)
Patient last seen 07/25/18 and has appointment 01/13/19

## 2019-01-13 ENCOUNTER — Other Ambulatory Visit: Payer: Self-pay

## 2019-01-13 ENCOUNTER — Ambulatory Visit (INDEPENDENT_AMBULATORY_CARE_PROVIDER_SITE_OTHER): Payer: 59 | Admitting: Family Medicine

## 2019-01-13 ENCOUNTER — Encounter: Payer: Self-pay | Admitting: Family Medicine

## 2019-01-13 VITALS — BP 153/98 | HR 88 | Temp 98.0°F | Ht 72.0 in

## 2019-01-13 DIAGNOSIS — I10 Essential (primary) hypertension: Secondary | ICD-10-CM | POA: Diagnosis not present

## 2019-01-13 DIAGNOSIS — D510 Vitamin B12 deficiency anemia due to intrinsic factor deficiency: Secondary | ICD-10-CM | POA: Diagnosis not present

## 2019-01-13 DIAGNOSIS — I480 Paroxysmal atrial fibrillation: Secondary | ICD-10-CM

## 2019-01-13 DIAGNOSIS — M5136 Other intervertebral disc degeneration, lumbar region: Secondary | ICD-10-CM

## 2019-01-13 DIAGNOSIS — E782 Mixed hyperlipidemia: Secondary | ICD-10-CM

## 2019-01-13 DIAGNOSIS — Z8546 Personal history of malignant neoplasm of prostate: Secondary | ICD-10-CM | POA: Diagnosis not present

## 2019-01-13 DIAGNOSIS — M47816 Spondylosis without myelopathy or radiculopathy, lumbar region: Secondary | ICD-10-CM

## 2019-01-13 DIAGNOSIS — S161XXA Strain of muscle, fascia and tendon at neck level, initial encounter: Secondary | ICD-10-CM

## 2019-01-13 DIAGNOSIS — M1731 Unilateral post-traumatic osteoarthritis, right knee: Secondary | ICD-10-CM
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
## 2019-01-15 ENCOUNTER — Telehealth: Payer: Self-pay | Admitting: Family Medicine

## 2019-01-15 ENCOUNTER — Encounter: Payer: Self-pay | Admitting: Family Medicine

## 2019-01-15 NOTE — Telephone Encounter (Signed)
Pt is calling with his vitals. Pt vitals on 01-13-19 BP 153/98, temp 98, HR 88 and Oxygen 98 . Pt had a virtual appt with rachel on 01-13-2019. Please advise

## 2019-01-15 NOTE — Telephone Encounter (Signed)
Vitals added to note, just FYI.

## 2019-01-20 MED ORDER — DICLOFENAC SODIUM 1 % EX GEL: 2.0000 g | Freq: Four times a day (QID) | CUTANEOUS | 2 refills | Status: DC

## 2019-01-20 MED ORDER — TRAMADOL HCL 50 MG PO TABS: 50.0000 mg | ORAL_TABLET | Freq: Two times a day (BID) | ORAL | 1 refills | Status: DC | PRN

## 2019-01-20 NOTE — Assessment & Plan Note (Signed)
Recent PSA stable, continue to monitor

## 2019-01-20 NOTE — Assessment & Plan Note (Signed)
Unable to obtain vital signs today as he's not at home with his machine. He will call back tomorrow with BP and HR. Will continue current regimen and adjust as needed

## 2019-01-20 NOTE — Assessment & Plan Note (Signed)
Followed by Cardiology, stable and under good control. Continue current regimen

## 2019-01-20 NOTE — Assessment & Plan Note (Signed)
-  Continue B12 supplementation °

## 2019-01-20 NOTE — Assessment & Plan Note (Signed)
Recheck lipids, adjust as needed. Continue current regimen 

## 2019-01-20 NOTE — Assessment & Plan Note (Signed)
Will consult with Pain Management team on recommendations regarding his chronic pain medications. Discussed with patient that we are limited in this primary care setting. WIll bridge until his regimen can be taken over by specialist. He is adamantly declining tapering down on tramadol use at this time. Continue gabapentin, flexeril prn and will send diclofenac gel for topical pain relief.

## 2019-01-22 NOTE — Progress Notes (Signed)
Spoke with pt and he inquired about a sleep aid. Pt uses [facility] [place]. Please advise.

## 2019-01-27 NOTE — Telephone Encounter (Signed)
error 

## 2019-01-29 ENCOUNTER — Telehealth: Payer: Self-pay | Admitting: Family Medicine

## 2019-01-29 NOTE — Telephone Encounter (Signed)
-----   Message from Stark Klein sent at 01/22/2019  1:32 PM EST -----   ----- Message ----- From: Volney American, PA-C Sent: 01/20/2019   9:58 AM EST To: [facility] Admin

## 2019-01-29 NOTE — Telephone Encounter (Signed)
He's already on remeron and trazodone - will need appt to discuss changing things if these are not working for him  Also please let him know I spoke with Dr. Holley Raring and he has agreed to take over his tramadol

## 2019-01-30 MED ORDER — MIRTAZAPINE 15 MG PO TABS: ORAL_TABLET | ORAL | 1 refills | Status: DC

## 2019-01-30 NOTE — Telephone Encounter (Signed)
Rx sent 

## 2019-01-30 NOTE — Telephone Encounter (Signed)
Patient needs a refill on the remeron.

## 2019-02-03 ENCOUNTER — Telehealth: Payer: Self-pay

## 2019-02-03 NOTE — Telephone Encounter (Signed)
He wants to get a shot in his wrist, fingers, hand. I'm not sure what to put in, I dont see an order.  Also, he needs to know how to proceed with Dr. Holley Raring giving him tramadol. He states his pcp retired and they contacted Dr. Holley Raring to give him permission to take over prescribing. He doesn't need right now but will in the future and he wants to go over all of it with Dr. Holley Raring.

## 2019-02-03 NOTE — Telephone Encounter (Signed)
Please call patient to schedule VV He hasnt been here since 11/202. I don't see that wrist pain or Dr. Holley Raring taking over Tramadol has been discussed previously. He will need to stop blood thinner before any procedure. Thanks

## 2019-02-12 ENCOUNTER — Encounter: Payer: Self-pay | Admitting: Student in an Organized Health Care Education/Training Program

## 2019-02-16 ENCOUNTER — Other Ambulatory Visit: Payer: Self-pay | Admitting: Cardiovascular Disease

## 2019-02-17 ENCOUNTER — Ambulatory Visit
Payer: 59 | Attending: Student in an Organized Health Care Education/Training Program | Admitting: Student in an Organized Health Care Education/Training Program

## 2019-02-17 ENCOUNTER — Encounter: Payer: Self-pay | Admitting: Student in an Organized Health Care Education/Training Program

## 2019-02-17 ENCOUNTER — Other Ambulatory Visit: Payer: Self-pay

## 2019-02-17 DIAGNOSIS — M65332 Trigger finger, left middle finger: Secondary | ICD-10-CM

## 2019-02-17 DIAGNOSIS — M19031 Primary osteoarthritis, right wrist: Secondary | ICD-10-CM | POA: Diagnosis not present

## 2019-02-17 DIAGNOSIS — G894 Chronic pain syndrome: Secondary | ICD-10-CM

## 2019-02-17 DIAGNOSIS — M19032 Primary osteoarthritis, left wrist: Secondary | ICD-10-CM

## 2019-02-17 NOTE — Progress Notes (Signed)
Patient: [name]  Service Category: E/M  Provider: Gillis Santa, MD  DOB: Feb 17, 1955  DOS: 02/17/2019  Location: Office  MRN: [id number]  Setting: Ambulatory outpatient  Referring Provider: Guadalupe Maple, MD  Type: Established Patient  Specialty: Interventional Pain Management  PCP: Guadalupe Maple, MD  Location: Home  Delivery: TeleHealth     Virtual Encounter - Pain Management PROVIDER NOTE: Information contained herein reflects review and annotations entered in association with encounter. Interpretation of such information and data should be left to medically-trained personnel. Information provided to patient can be located elsewhere in the medical record under "Patient Instructions". Document created using STT-dictation technology, any transcriptional errors that may result from process are unintentional.    Contact & Pharmacy Preferred: [phone] Home: [phone] (home) Mobile: There is no such number on file (mobile). E-mail: [email or web address] XO:[id number] [facility] [phone] - [phone], [place] [facility] [address] AT [place] [facility] [place] Phone: [phone] Fax: [phone]   Pre-screening  Mr. Lupton offered "in-person" vs "virtual" encounter. He indicated preferring virtual for this encounter.   Reason COVID-19*  Social distancing based on CDC and AMA recommendations.   I contacted [phone] on 02/17/2019 via telephone.      I clearly identified myself as 15/01/2019, MD. I verified that I was speaking with the correct person using two identifiers (Name: MONTOYA OVALLE, and date of birth: [birth year]).  This visit was completed via telephone due to the restrictions of the COVID-19 pandemic. All issues as above were discussed and addressed but no physical exam was performed. If it was felt that the patient should be evaluated in the office, they were directed there. The patient verbally consented to this visit. Patient was unable to  complete an audio/visual visit due to Technical difficulties and/or Lack of internet. Due to the catastrophic nature of the COVID-19 pandemic, this visit was done through audio contact only.  Location of the patient: home address (see Epic for details)  Location of the provider: office Consent I sought verbal advanced consent from 08/28/1955 for virtual visit interactions. I informed Mr. Pizzoferrato of possible security and privacy concerns, risks, and limitations associated with providing "not-in-person" medical evaluation and management services. I also informed Mr. Dewit of the availability of "in-person" appointments. Finally, I informed him that there would be a charge for the virtual visit and that he could be  personally, fully or partially, financially responsible for it. Mr. Obannon expressed understanding and agreed to proceed.   Historic Elements   Mr. WHARTON DITTOE is a 64 y.o. year old, male patient evaluated today after his last encounter by our practice on 02/03/2019. Mr. Dorgan  has a past medical history of Cardiomyopathy (in setting of Afib), DJD (degenerative joint disease) of knee, History of kidney stones, Intervertebral disc disorder with radiculopathy of lumbosacral region, Kidney stone (2012), Lower extremity edema, PAF (paroxysmal atrial fibrillation) ([place]), Pernicious anemia, Prostate cancer ([place]), Pulmonary nodule, right, and SVT (supraventricular tachycardia) ([place]). He also  has a past surgical history that includes Knee surgery; Gastric bypass (2002); prostate seeding; Carpal tunnel release (Right, 12/23/2014); Carpal tunnel release (Left, 01/06/2015); and Colonoscopy with propofol (N/A, 10/08/2017). Mr. Winkfield has a current medication list which includes the following prescription(s): amiodarone, amlodipine, benazepril, cyanocobalamin, cyclobenzaprine, diclofenac sodium, furosemide, gabapentin, metoprolol succinate, mirtazapine, rosuvastatin, tramadol, trazodone, xarelto, and  ezetimibe. He  reports that he quit smoking about 40 years ago.  His smoking use included cigarettes. He has a 10.00 pack-year smoking history. He has quit using smokeless tobacco.  His smokeless tobacco use included snuff. He reports current alcohol use of about 12.0 standard drinks of alcohol per week. He reports that he does not use drugs. Mr. Airey has No Known Allergies.   HPI  Today, he is being contacted for new problems.   Patient's chief complaints today are his right wrist pain and left middle finger pain due to a trigger finger.  He has previously had cortisone injections in his right wrist as well as his left trigger finger which were helpful.  He would like to repeat these.  Patient also states that since his PCP Dr. Charlane Ferretti left, Merrie Roof has been managing his tramadol and prefers that he be managed here at the pain clinic.  This is reasonable as patient has been compliant with therapy for a long time and is on low-dose MME.  I informed patient that when he comes in for his procedure, we will need to complete a urine toxicology screen and so long as that is appropriate, can take over his tramadol formally.  He will need to sign a pain contract at that time.  Patient endorses understanding.  Laboratory Chemistry Profile (12 mo)  Renal: 08/13/2018: BUN 12; BUN/Creatinine Ratio 12; Creatinine, Ser 1.00  Lab Results  Component Value Date   GFRAA 92 08/13/2018   GFRNONAA 79 08/13/2018   Hepatic: 07/25/2018: Albumin 4.6 Lab Results  Component Value Date   AST 19 07/25/2018   ALT 19 07/25/2018   Other: No results found for requested labs within last 8760 hours.  Note: Above Lab results reviewed.   Assessment  The primary encounter diagnosis was Primary osteoarthritis of both wrists. Diagnoses of Trigger middle finger of left hand and Chronic pain syndrome were also pertinent to this visit.  Plan of Care   I am having Grier Rocher. Pierro maintain his gabapentin, furosemide,  traZODone, cyanocobalamin, metoprolol succinate, rosuvastatin, Xarelto, amLODipine, benazepril, amiodarone, cyclobenzaprine, diclofenac Sodium, traMADol, and mirtazapine.  Orders:  Orders Placed This Encounter  Procedures  . TRIGGER POINT INJECTION    Standing Status:   Future    Standing Expiration Date:   03/19/2019    Scheduling Instructions:     Left middle trigger finger    Order Specific Question:   Where will this procedure be performed?    Answer:   [facility]  . Medium Joint Injection/Arthrocentesis    Right wrist injection    Standing Status:   Future    Standing Expiration Date:   02/17/2020  . Compliance Drug Analysis, Ur    Volume: 30 ml(s). Minimum 3 ml of urine is needed. Document temperature of fresh sample. Indications: Long term (current) use of opiate analgesic [id number]) Test#: [id number] (Comprehensive Profile)   Follow-up plan:   Return in about 1 week (around 02/24/2019) for Right wrist and left trigger finger injection.     s/p lesi #1 (left L4/5) on 8/17, #2 on 10/21/2018 (left L3/4), #3 on 12/09/2018 (Left L3/4); plan for right wrist injection and left trigger finger (middle finger) injection.   Recent Visits Date Type Provider Dept  12/09/18 Procedure visit Gillis Santa, MD [facility]  11/26/18 Office Visit Gillis Santa, MD [facility]  Showing recent visits within past 90 days and meeting all other requirements   Today's Visits Date Type Provider Dept  02/17/19 Office Visit Gillis Santa, MD [facility]  Showing today's  visits and meeting all other requirements   Future Appointments Date Type Provider Dept  03/11/19 Appointment Gillis Santa, MD [facility]  Showing future appointments within next 90 days and meeting all other requirements   I discussed the assessment and treatment plan with the patient. The patient was provided an opportunity to ask questions and all were answered. The patient agreed with  the plan and demonstrated an understanding of the instructions.  Patient advised to call back or seek an in-person evaluation if the symptoms or condition worsens.  Duration of encounter: 21minutes.  Note by: Gillis Santa, MD Date: 02/17/2019; Time: 3:17 PM

## 2019-02-24 ENCOUNTER — Ambulatory Visit
Admission: RE | Admit: 2019-02-24 | Discharge: 2019-02-24 | Disposition: A | Discharge status: Home / Self Care | Payer: 59 | Source: Ambulatory Visit | Attending: Student in an Organized Health Care Education/Training Program | Admitting: Student in an Organized Health Care Education/Training Program

## 2019-02-24 ENCOUNTER — Encounter: Payer: Self-pay | Admitting: Student in an Organized Health Care Education/Training Program

## 2019-02-24 ENCOUNTER — Ambulatory Visit (HOSPITAL_BASED_OUTPATIENT_CLINIC_OR_DEPARTMENT_OTHER): Payer: 59 | Admitting: Student in an Organized Health Care Education/Training Program

## 2019-02-24 ENCOUNTER — Other Ambulatory Visit: Payer: Self-pay

## 2019-02-24 VITALS — BP 119/78 | HR 90 | Temp 98.5°F | Resp 15 | Ht 72.0 in | Wt 360.0 lb

## 2019-02-24 DIAGNOSIS — M65332 Trigger finger, left middle finger: Secondary | ICD-10-CM

## 2019-02-24 DIAGNOSIS — Z79899 Other long term (current) drug therapy: Secondary | ICD-10-CM | POA: Diagnosis not present

## 2019-02-24 DIAGNOSIS — M19032 Primary osteoarthritis, left wrist: Secondary | ICD-10-CM | POA: Diagnosis not present

## 2019-02-24 DIAGNOSIS — M19031 Primary osteoarthritis, right wrist: Secondary | ICD-10-CM

## 2019-02-24 DIAGNOSIS — Z9884 Bariatric surgery status: Secondary | ICD-10-CM | POA: Diagnosis not present

## 2019-02-24 MED ORDER — DEXAMETHASONE SODIUM PHOSPHATE 10 MG/ML IJ SOLN
10.0000 mg | Freq: Once | INTRAMUSCULAR | Status: AC
  Administered 2019-02-24: 10 mg
  Filled 2019-02-24: qty 1

## 2019-02-24 MED ORDER — LIDOCAINE HCL 2 % IJ SOLN
20.0000 mL | Freq: Once | INTRAMUSCULAR | Status: AC
  Administered 2019-02-24: 09:00:00 400 mg
  Filled 2019-02-24: qty 40
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
## 2019-02-24 NOTE — Progress Notes (Signed)
PROVIDER NOTE: Information contained herein reflects review and annotations entered in association with encounter. Interpretation of such information and data should be left to medically-trained personnel. Information provided to patient can be located elsewhere in the medical record under "Patient Instructions". Document created using STT-dictation technology, any transcriptional errors that may result from process are unintentional.    Patient: [name]  Service Category: Procedure  Provider: Gillis Santa, MD  DOB: 1955/07/27  DOS: 02/24/2019  Location: [facility]  MRN: [id number]  Setting: Ambulatory - outpatient  Referring Provider: Gillis Santa, MD  Type: Established Patient  Specialty: Interventional Pain Management  PCP: Guadalupe Maple, MD   Primary Reason for Visit: Interventional Pain Management Treatment. CC: Joint Pain (generalized arthritis. ), Hand Pain (left), and Wrist Pain (right )  Procedure:          Anesthesia, Analgesia, Anxiolysis:  Type: Trigger Finger Ligament/Tendon sheath [id number]) Injection.  #1  Purpose: Diagnostic Target Area: Flexor Digitorum Tendon sheath nodule Region: A-1(proximal) pulley of the metacarpal area Approach: Percutaneous Digit: No:3(Middle) Finger Laterality: Left-hand  Type: Local Anesthesia Indication(s): Analgesia         Local Anesthetic: Lidocaine 1-2% Route: Infiltration ([place]/IM) IV Access: Declined Sedation: Declined   Position: Sitting   Indications: 1. Primary osteoarthritis of both wrists   2. Trigger middle finger of left hand    Pain Score: Pre-procedure: 6 /10 Post-procedure: 6 /10   Pre-op Assessment:  [name] is a 64 y.o. (year old), male patient, seen today for interventional treatment. He  has a past surgical history that includes Knee surgery; Gastric bypass (2002); prostate seeding; Carpal tunnel release (Right, 12/23/2014); Carpal tunnel release (Left, 01/06/2015); and Colonoscopy with propofol  (N/A, 10/08/2017). [name] has a current medication list which includes the following prescription(s): amiodarone, amlodipine, benazepril, cyanocobalamin, cyclobenzaprine, ezetimibe, gabapentin, metoprolol succinate, mirtazapine, rosuvastatin, tramadol, trazodone, xarelto, diclofenac sodium, and furosemide. His primarily concern today is the Joint Pain (generalized arthritis. ), Hand Pain (left), and Wrist Pain (right )  Initial Vital Signs:  Pulse/HCG Rate: 95  Temp: 98.5 F (36.9 C) Resp: 16 BP: (!) 149/95 SpO2: 98 %  BMI: Estimated body mass index is 48.82 kg/m as calculated from the following:   Height as of this encounter: 6' (1.829 m).   Weight as of this encounter: 360 lb (163.3 kg).  Risk Assessment: Allergies: Reviewed. He has No Known Allergies.  Allergy Precautions: None required Coagulopathies: Reviewed. None identified.  Blood-thinner therapy: None at this time Active Infection(s): Reviewed. None identified. [name] is afebrile  Site Confirmation: [name] was asked to confirm the procedure and laterality before marking the site Procedure checklist: Completed Consent: Before the procedure and under the influence of no sedative(s), amnesic(s), or anxiolytics, the patient was informed of the treatment options, risks and possible complications. To fulfill our ethical and legal obligations, as recommended by the American Medical Association's Code of Ethics, I have informed the patient of my clinical impression; the nature and purpose of the treatment or procedure; the risks, benefits, and possible complications of the intervention; the alternatives, including doing nothing; the risk(s) and benefit(s) of the alternative treatment(s) or procedure(s); and the risk(s) and benefit(s) of doing nothing. The patient was provided information about the general risks and possible complications associated with the procedure. These may include, but are not limited to: failure to achieve  desired goals, infection, bleeding, organ or nerve damage, allergic reactions, paralysis, and death. In addition, the patient was informed of those  risks and complications associated to the procedure, such as failure to decrease pain; infection; bleeding; organ or nerve damage with subsequent damage to sensory, motor, and/or autonomic systems, resulting in permanent pain, numbness, and/or weakness of one or several areas of the body; allergic reactions; (i.e.: anaphylactic reaction); and/or death. Furthermore, the patient was informed of those risks and complications associated with the medications. These include, but are not limited to: allergic reactions (i.e.: anaphylactic or anaphylactoid reaction(s)); adrenal axis suppression; blood sugar elevation that in diabetics may result in ketoacidosis or comma; water retention that in patients with history of congestive heart failure may result in shortness of breath, pulmonary edema, and decompensation with resultant heart failure; weight gain; swelling or edema; medication-induced neural toxicity; particulate matter embolism and blood vessel occlusion with resultant organ, and/or nervous system infarction; and/or aseptic necrosis of one or more joints. Finally, the patient was informed that Medicine is not an exact science; therefore, there is also the possibility of unforeseen or unpredictable risks and/or possible complications that may result in a catastrophic outcome. The patient indicated having understood very clearly. We have given the patient no guarantees and we have made no promises. Enough time was given to the patient to ask questions, all of which were answered to the patient's satisfaction. [name] has indicated that he wanted to continue with the procedure. Attestation: I, the ordering provider, attest that I have discussed with the patient the benefits, risks, side-effects, alternatives, likelihood of achieving goals, and potential problems  during recovery for the procedure that I have provided informed consent. Date  Time: 02/24/2019  8:29 AM  Pre-Procedure Preparation:  Monitoring: As per clinic protocol. Respiration, ETCO2, SpO2, BP, heart rate and rhythm monitor placed and checked for adequate function Safety Precautions: Patient was assessed for positional comfort and pressure points before starting the procedure. Time-out: I initiated and conducted the "Time-out" before starting the procedure, as per protocol. The patient was asked to participate by confirming the accuracy of the "Time Out" information. Verification of the correct person, site, and procedure were performed and confirmed by me, the nursing staff, and the patient. "Time-out" conducted as per Joint Commission's Universal Protocol (UP.01.01.01). Time: 0916  Description of Procedure:          Area Prepped: Entire palmar and dorsal aspect of hand, up to forearm area. Prepping solution: DuraPrep (Iodine Povacrylex [0.7% available iodine] and Isopropyl Alcohol, 74% w/w) Safety Precautions: Aspiration looking for blood return was conducted prior to all injections. At no point did we inject any substances, as a needle was being advanced. No attempts were made at seeking any paresthesias. Safe injection practices and needle disposal techniques used. Medications properly checked for expiration dates. SDV (single dose vial) medications used. Description of the Procedure: Protocol guidelines were followed. The patient was placed in position. The target area was identified and prepped in the usual manner. Skin & deeper tissues infiltrated with local anesthetic. Appropriate time provided for local anesthetics to take effect. The procedure needle was slowly advanced to target area. Proper needle placement secured. Negative aspiration confirmed. Solution injected in intermittent fashion, asking for systemic symptoms every 0.5cc. Needle(s) removed and area cleaned, making sure to leave  some prepping solution back to take advantage of its long term bactericidal properties.  Vitals:   02/24/19 0844 02/24/19 0915 02/24/19 0920  BP: (!) 149/95 (!) 130/95 119/78  Pulse: 95 89 90  Resp: 16 16 15   Temp: 98.5 F (36.9 C)    TempSrc: Oral  SpO2: 98% 99% 98%  Weight: (!) 360 lb (163.3 kg)    Height: 6' (1.829 m)      Start Time: 0916 hrs. End Time:   hrs. Materials:  Needle(s) Type: Regular needle Gauge: 25G Length: 1.5-in Medication(s): Please see orders for medications and dosing details. 1 cc solution consisting of 0.5 cc of 0.2% ropivacaine, 0.5 cc of Decadron 10 mg/cc.   Imaging Guidance:          Type of Imaging Technique: None used Indication(s): N/A Exposure Time: No patient exposure Contrast: None used. Fluoroscopic Guidance: N/A Ultrasound Guidance: N/A Interpretation: N/A  Post-operative Assessment:  Post-procedure Vital Signs:  Pulse/HCG Rate: 90  Temp: 98.5 F (36.9 C) Resp: 15 BP: 119/78 SpO2: 98 %  EBL: None  Complications: No immediate post-treatment complications observed by team, or reported by patient.  Note: The patient tolerated the entire procedure well. A repeat set of vitals were taken after the procedure and the patient was kept under observation following institutional policy, for this type of procedure. Post-procedural neurological assessment was performed, showing return to baseline, prior to discharge. The patient was provided with post-procedure discharge instructions, including a section on how to identify potential problems. Should any problems arise concerning this procedure, the patient was given instructions to immediately contact us, at any time, without hesitation. In any case, we plan to contact the patient by telephone for a follow-up status report regarding this interventional procedure.  Comments:  No additional relevant information.  Plan of Care  Orders:  Orders Placed This Encounter  Procedures  . DG PAIN  CLINIC C-ARM 1-60 MIN NO REPORT    Intraoperative interpretation by procedural physician at [place].    Standing Status:   Standing    Number of Occurrences:   1    Order Specific Question:   Reason for exam:    Answer:   Assistance in needle guidance and placement for procedures requiring needle placement in or near specific anatomical locations not easily accessible without such assistance.   Medications ordered for procedure: Meds ordered this encounter  Medications  . lidocaine (XYLOCAINE) 2 % (with pres) injection 400 mg  . dexamethasone (DECADRON) injection 10 mg   Medications administered: We administered lidocaine and dexamethasone.  See the medical record for exact dosing, route, and time of administration.  Follow-up plan:   Return in about 2 weeks (around 03/10/2019) for Medication Management, Post Procedure Evaluation.      s/p lesi #1 (left L4/5) on 8/17, #2 on 10/21/2018 (left L3/4), #3 on 12/09/2018 (Left L3/4); plan for right wrist injection and left trigger finger (middle finger) injection.    Recent Visits Date Type Provider Dept  02/17/19 Office Visit Gillis Santa, MD [facility]  12/09/18 Procedure visit Gillis Santa, MD [facility]  11/26/18 Office Visit Gillis Santa, MD [facility]  Showing recent visits within past 90 days and meeting all other requirements   Today's Visits Date Type Provider Dept  02/24/19 Procedure visit Gillis Santa, MD [facility]  Showing today's visits and meeting all other requirements   Future Appointments Date Type Provider Dept  03/11/19 Appointment Gillis Santa, MD [facility]  Showing future appointments within next 90 days and meeting all other requirements   Disposition: Discharge home  Discharge (Date  Time): 02/24/2019; 0928 hrs.   Primary Care Physician: Guadalupe Maple, MD Location: [facility] Note by: Gillis Santa,  MD Date: 02/24/2019; Time: 11:30 AM  Disclaimer:  Medicine is not an Chief Strategy Officer. The only guarantee in medicine is that nothing is guaranteed. It is important to note that the decision to proceed with this intervention was based on the information collected from the patient. The Data and conclusions were drawn from the patient's questionnaire, the interview, and the physical examination. Because the information was provided in large part by the patient, it cannot be guaranteed that it has not been purposely or unconsciously manipulated. Every effort has been made to obtain as much relevant data as possible for this evaluation. It is important to note that the conclusions that lead to this procedure are derived in large part from the available data. Always take into account that the treatment will also be dependent on availability of resources and existing treatment guidelines, considered by other Pain Management Practitioners as being common knowledge and practice, at the time of the intervention. For Medico-Legal purposes, it is also important to point out that variation in procedural techniques and pharmacological choices are the acceptable norm. The indications, contraindications, technique, and results of the above procedure should only be interpreted and judged by a Board-Certified Interventional Pain Specialist with extensive familiarity and expertise in the same exact procedure and technique.

## 2019-02-24 NOTE — Progress Notes (Signed)
PROVIDER NOTE: Information contained herein reflects review and annotations entered in association with encounter. Interpretation of such information and data should be left to medically-trained personnel. Information provided to patient can be located elsewhere in the medical record under "Patient Instructions". Document created using STT-dictation technology, any transcriptional errors that may result from process are unintentional.    Patient: [name]  Service Category: Procedure  Provider: Gillis Santa, MD  DOB: 02-15-1955  DOS: 02/24/2019  Location: [facility]  MRN: [id number]  Setting: Ambulatory - outpatient  Referring Provider: Gillis Santa, MD  Type: Established Patient  Specialty: Interventional Pain Management  PCP: Guadalupe Maple, MD   Primary Reason for Visit: Interventional Pain Management Treatment. CC: Joint Pain (generalized arthritis. ), Hand Pain (left), and Wrist Pain (right )  Procedure:          Anesthesia, Analgesia, Anxiolysis:  Type: Diagnostic Wrist Steroid Injection           Region: Dorsal Scaphoid Level: Wrist Laterality: Right-Sided  Type: Local Anesthesia Indication(s): Analgesia         Local Anesthetic: Lidocaine 1-2% Route: Infiltration ([place]/IM) IV Access: Declined Sedation: Declined   Position: Sitting   Indications: 1. Primary osteoarthritis of both wrists   2. Trigger middle finger of left hand    Here for right wrist injection and left ring trigger finger injection  Pain Score: Pre-procedure: 6 /10 Post-procedure: 6 /10   Pre-op Assessment:  [name] is a 64 y.o. (year old), male patient, seen today for interventional treatment. He  has a past surgical history that includes Knee surgery; Gastric bypass (2002); prostate seeding; Carpal tunnel release (Right, 12/23/2014); Carpal tunnel release (Left, 01/06/2015); and Colonoscopy with propofol (N/A, 10/08/2017). [name] has a current medication list which includes the  following prescription(s): amiodarone, amlodipine, benazepril, cyanocobalamin, cyclobenzaprine, ezetimibe, gabapentin, metoprolol succinate, mirtazapine, rosuvastatin, tramadol, trazodone, xarelto, diclofenac sodium, and furosemide. His primarily concern today is the Joint Pain (generalized arthritis. ), Hand Pain (left), and Wrist Pain (right )  Initial Vital Signs:  Pulse/HCG Rate: 95  Temp: 98.5 F (36.9 C) Resp: 16 BP: (!) 149/95 SpO2: 98 %  BMI: Estimated body mass index is 48.82 kg/m as calculated from the following:   Height as of this encounter: 6' (1.829 m).   Weight as of this encounter: 360 lb (163.3 kg).  Risk Assessment: Allergies: Reviewed. He has No Known Allergies.  Allergy Precautions: None required Coagulopathies: Reviewed. None identified.  Blood-thinner therapy: None at this time Active Infection(s): Reviewed. None identified. [name] is afebrile  Site Confirmation: [name] was asked to confirm the procedure and laterality before marking the site Procedure checklist: Completed Consent: Before the procedure and under the influence of no sedative(s), amnesic(s), or anxiolytics, the patient was informed of the treatment options, risks and possible complications. To fulfill our ethical and legal obligations, as recommended by the American Medical Association's Code of Ethics, I have informed the patient of my clinical impression; the nature and purpose of the treatment or procedure; the risks, benefits, and possible complications of the intervention; the alternatives, including doing nothing; the risk(s) and benefit(s) of the alternative treatment(s) or procedure(s); and the risk(s) and benefit(s) of doing nothing. The patient was provided information about the general risks and possible complications associated with the procedure. These may include, but are not limited to: failure to achieve desired goals, infection, bleeding, organ or nerve damage, allergic reactions,  paralysis, and death. In addition, the patient was informed of  those risks and complications associated to the procedure, such as failure to decrease pain; infection; bleeding; organ or nerve damage with subsequent damage to sensory, motor, and/or autonomic systems, resulting in permanent pain, numbness, and/or weakness of one or several areas of the body; allergic reactions; (i.e.: anaphylactic reaction); and/or death. Furthermore, the patient was informed of those risks and complications associated with the medications. These include, but are not limited to: allergic reactions (i.e.: anaphylactic or anaphylactoid reaction(s)); adrenal axis suppression; blood sugar elevation that in diabetics may result in ketoacidosis or comma; water retention that in patients with history of congestive heart failure may result in shortness of breath, pulmonary edema, and decompensation with resultant heart failure; weight gain; swelling or edema; medication-induced neural toxicity; particulate matter embolism and blood vessel occlusion with resultant organ, and/or nervous system infarction; and/or aseptic necrosis of one or more joints. Finally, the patient was informed that Medicine is not an exact science; therefore, there is also the possibility of unforeseen or unpredictable risks and/or possible complications that may result in a catastrophic outcome. The patient indicated having understood very clearly. We have given the patient no guarantees and we have made no promises. Enough time was given to the patient to ask questions, all of which were answered to the patient's satisfaction. [name] has indicated that he wanted to continue with the procedure. Attestation: I, the ordering provider, attest that I have discussed with the patient the benefits, risks, side-effects, alternatives, likelihood of achieving goals, and potential problems during recovery for the procedure that I have provided informed consent. Date   Time: 02/24/2019  8:29 AM  Pre-Procedure Preparation:  Monitoring: As per clinic protocol. Respiration, ETCO2, SpO2, BP, heart rate and rhythm monitor placed and checked for adequate function Safety Precautions: Patient was assessed for positional comfort and pressure points before starting the procedure. Time-out: I initiated and conducted the "Time-out" before starting the procedure, as per protocol. The patient was asked to participate by confirming the accuracy of the "Time Out" information. Verification of the correct person, site, and procedure were performed and confirmed by me, the nursing staff, and the patient. "Time-out" conducted as per Joint Commission's Universal Protocol (UP.01.01.01). Time: 0916  Description of Procedure:          Target Area: between scaphoid and lunate Approach: Dorsal approach. Area Prepped: Entire wrist Region Prepping solution: DuraPrep (Iodine Povacrylex [0.7% available iodine] and Isopropyl Alcohol, 74% w/w) Safety Precautions: Aspiration looking for blood return was conducted prior to all injections. At no point did we inject any substances, as a needle was being advanced. No attempts were made at seeking any paresthesias. Safe injection practices and needle disposal techniques used. Medications properly checked for expiration dates. SDV (single dose vial) medications used. Description of the Procedure: Protocol guidelines were followed. The patient was placed in position. The target area was identified and the area prepped in the usual manner. Skin & deeper tissues infiltrated with local anesthetic. Appropriate amount of time allowed to pass for local anesthetics to take effect. The procedure needles were then advanced to the target area. Proper needle placement secured. Negative aspiration confirmed. Solution injected in intermittent fashion, asking for systemic symptoms every 0.5cc of injectate. The needles were then removed and the area cleansed, making sure  to leave some of the prepping solution back to take advantage of its long term bactericidal properties.  Vitals:   02/24/19 0844 02/24/19 0915 02/24/19 0920  BP: (!) 149/95 (!) 130/95 119/78  Pulse: 95 89 90  Resp: 16 16 15   Temp: 98.5 F (36.9 C)    TempSrc: Oral    SpO2: 98% 99% 98%  Weight: (!) 360 lb (163.3 kg)    Height: 6' (1.829 m)      Start Time: 0916 hrs. End Time:   hrs. Materials:  Needle(s) Type: Regular needle Gauge: 25G Length: 1.5-in Medication(s): Please see orders for medications and dosing details. 1 cc solution consisting of half a cc of 0.2% ropivacaine, half a cc of Decadron 10 mg/cc. Imaging Guidance:          Type of Imaging Technique: None used Indication(s): N/A Exposure Time: No patient exposure Contrast: None used. Fluoroscopic Guidance: N/A Ultrasound Guidance: N/A Interpretation: N/A  Antibiotic Prophylaxis:   Anti-infectives (From admission, onward)   None     Indication(s): None identified  Post-operative Assessment:  Post-procedure Vital Signs:  Pulse/HCG Rate: 90  Temp: 98.5 F (36.9 C) Resp: 15 BP: 119/78 SpO2: 98 %  EBL: None  Complications: No immediate post-treatment complications observed by team, or reported by patient.  Note: The patient tolerated the entire procedure well. A repeat set of vitals were taken after the procedure and the patient was kept under observation following institutional policy, for this type of procedure. Post-procedural neurological assessment was performed, showing return to baseline, prior to discharge. The patient was provided with post-procedure discharge instructions, including a section on how to identify potential problems. Should any problems arise concerning this procedure, the patient was given instructions to immediately contact us, at any time, without hesitation. In any case, we plan to contact the patient by telephone for a follow-up status report regarding this interventional  procedure.  Comments:  No additional relevant information.  Plan of Care  Orders:  Orders Placed This Encounter  Procedures  . [facility] C-ARM 1-60 MIN NO REPORT    Intraoperative interpretation by procedural physician at [place].    Standing Status:   Standing    Number of Occurrences:   1    Order Specific Question:   Reason for exam:    Answer:   Assistance in needle guidance and placement for procedures requiring needle placement in or near specific anatomical locations not easily accessible without such assistance.   Medications ordered for procedure: Meds ordered this encounter  Medications  . lidocaine (XYLOCAINE) 2 % (with pres) injection 400 mg  . dexamethasone (DECADRON) injection 10 mg   Medications administered: We administered lidocaine and dexamethasone.  See the medical record for exact dosing, route, and time of administration.  Follow-up plan:   Return in about 2 weeks (around 03/10/2019) for Medication Management, Post Procedure Evaluation.      s/p lesi #1 (left L4/5) on 8/17, #2 on 10/21/2018 (left L3/4), #3 on 12/09/2018 (Left L3/4); 02/24/2019: Right wrist injection and left trigger finger (middle finger) injection.    Recent Visits Date Type Provider Dept  02/17/19 Office Visit Gillis Santa, MD [facility]  12/09/18 Procedure visit Gillis Santa, MD [facility]  11/26/18 Office Visit Gillis Santa, MD [facility]  Showing recent visits within past 90 days and meeting all other requirements   Today's Visits Date Type Provider Dept  02/24/19 Procedure visit Gillis Santa, MD [facility]  Showing today's visits and meeting all other requirements   Future Appointments Date Type Provider Dept  03/11/19 Appointment Gillis Santa, MD [facility]  Showing future appointments within next 90 days and meeting all other requirements   Disposition:  Discharge home  Discharge (Date  Time):  02/24/2019; 0928 hrs.   Primary Care Physician: Guadalupe Maple, MD Location: [facility] Note by: Gillis Santa, MD Date: 02/24/2019; Time: 11:28 AM  Disclaimer:  Medicine is not an exact science. The only guarantee in medicine is that nothing is guaranteed. It is important to note that the decision to proceed with this intervention was based on the information collected from the patient. The Data and conclusions were drawn from the patient's questionnaire, the interview, and the physical examination. Because the information was provided in large part by the patient, it cannot be guaranteed that it has not been purposely or unconsciously manipulated. Every effort has been made to obtain as much relevant data as possible for this evaluation. It is important to note that the conclusions that lead to this procedure are derived in large part from the available data. Always take into account that the treatment will also be dependent on availability of resources and existing treatment guidelines, considered by other Pain Management Practitioners as being common knowledge and practice, at the time of the intervention. For Medico-Legal purposes, it is also important to point out that variation in procedural techniques and pharmacological choices are the acceptable norm. The indications, contraindications, technique, and results of the above procedure should only be interpreted and judged by a Board-Certified Interventional Pain Specialist with extensive familiarity and expertise in the same exact procedure and technique.

## 2019-02-24 NOTE — Patient Instructions (Signed)

## 2019-02-25 ENCOUNTER — Telehealth: Payer: Self-pay

## 2019-02-25 ENCOUNTER — Ambulatory Visit (INDEPENDENT_AMBULATORY_CARE_PROVIDER_SITE_OTHER): Payer: 59 | Admitting: Cardiovascular Disease

## 2019-02-25 ENCOUNTER — Telehealth: Payer: Self-pay | Admitting: Cardiovascular Disease

## 2019-02-25 ENCOUNTER — Encounter: Payer: Self-pay | Admitting: Cardiovascular Disease

## 2019-02-25 ENCOUNTER — Other Ambulatory Visit: Payer: Self-pay

## 2019-02-25 VITALS — BP 160/82 | HR 102 | Ht 72.0 in | Wt 362.2 lb

## 2019-02-25 DIAGNOSIS — I10 Essential (primary) hypertension: Secondary | ICD-10-CM

## 2019-02-25 DIAGNOSIS — E782 Mixed hyperlipidemia: Secondary | ICD-10-CM | POA: Diagnosis not present

## 2019-02-25 DIAGNOSIS — I480 Paroxysmal atrial fibrillation: Secondary | ICD-10-CM | POA: Diagnosis not present

## 2019-02-25 MED ORDER — ROSUVASTATIN CALCIUM 10 MG PO TABS: 10.0000 mg | ORAL_TABLET | Freq: Every day | ORAL | 3 refills | Status: DC

## 2019-02-25 MED ORDER — AMLODIPINE BESYLATE 5 MG PO TABS: 5.0000 mg | ORAL_TABLET | Freq: Every day | ORAL | 3 refills | Status: DC

## 2019-02-25 MED ORDER — BENAZEPRIL HCL 40 MG PO TABS: 40.0000 mg | ORAL_TABLET | Freq: Every day | ORAL | 0 refills | Status: DC

## 2019-02-25 MED ORDER — RIVAROXABAN 20 MG PO TABS: ORAL_TABLET | ORAL | 3 refills | Status: DC

## 2019-02-25 MED ORDER — EZETIMIBE 10 MG PO TABS: ORAL_TABLET | ORAL | 3 refills | Status: DC

## 2019-02-25 MED ORDER — METOPROLOL SUCCINATE ER 50 MG PO TB24: 50.0000 mg | ORAL_TABLET | Freq: Every day | ORAL | 3 refills | Status: DC

## 2019-02-25 NOTE — Patient Instructions (Addendum)
You are in atrial fibrillation today  Medication Instructions:  Your physician has recommended you make the following change in your medication:  1. HOLD Amiodarone 2. STAY on Xarelto 3. INCREASE Metoprolol succinate to 50 mg once daily  Monitor blood pressure at home Call the office with some numbers Make sure to check blood pressures 2 hours after medications. Read additional information below   If you need a refill on your cardiac medications before your next appointment, please call your pharmacy.    Lab work: No new labs needed   If you have labs (blood work) drawn today and your tests are completely normal, you will receive your results only by: Marland Kitchen MyChart Message (if you have MyChart) OR . A paper copy in the mail If you have any lab test that is abnormal or we need to change your treatment, we will call you to review the results.   Testing/Procedures: No new testing needed   Follow-Up: At [facility], you and your health needs are our priority.  As part of our continuing mission to provide you with exceptional heart care, we have created designated Provider Care Teams.  These Care Teams include your primary Cardiologist (physician) and Advanced Practice Providers (APPs -  Physician Assistants and Nurse Practitioners) who all work together to provide you with the care you need, when you need it.  . You will need a follow up appointment in 1 month, virtual visit   . Providers on your designated Care Team:   . Murray Hodgkins, NP . Christell Faith, PA-C . Marrianne Mood, PA-C  Any Other Special Instructions Will Be Listed Below (If Applicable).  For educational health videos Log in to : [email or web address] Or : [email or web address], password : [email or web address]   How to Take Your Blood Pressure You can take your blood pressure at home with a machine. You may need to check your blood pressure at home:  To check if you have high blood pressure (hypertension).  To check your blood  pressure over time.  To make sure your blood pressure medicine is working. Supplies needed: You will need a blood pressure machine, or monitor. You can buy one at a drugstore or online. When choosing one:  Choose one with an arm cuff.  Choose one that wraps around your upper arm. Only one finger should fit between your arm and the cuff.  Do not choose one that measures your blood pressure from your wrist or finger. Your doctor can suggest a monitor. How to prepare Avoid these things for 30 minutes before checking your blood pressure:  Drinking caffeine.  Drinking alcohol.  Eating.  Smoking.  Exercising. Five minutes before checking your blood pressure:  Pee.  Sit in a dining chair. Avoid sitting in a soft couch or armchair.  Be quiet. Do not talk. How to take your blood pressure Follow the instructions that came with your machine. If you have a digital blood pressure monitor, these may be the instructions: 1. Sit up straight. 2. Place your feet on the floor. Do not cross your ankles or legs. 3. Rest your left arm at the level of your heart. You may rest it on a table, desk, or chair. 4. Pull up your shirt sleeve. 5. Wrap the blood pressure cuff around the upper part of your left arm. The cuff should be 1 inch (2.5 cm) above your elbow. It is best to wrap the cuff around bare skin. 6. Fit the cuff snugly around your arm. You should be  able to place only one finger between the cuff and your arm. 7. Put the cord inside the groove of your elbow. 8. Press the power button. 9. Sit quietly while the cuff fills with air and loses air. 10. Write down the numbers on the screen. 11. Wait 2-3 minutes and then repeat steps 1-10. What do the numbers mean? Two numbers make up your blood pressure. The first number is called systolic pressure. The second is called diastolic pressure. An example of a blood pressure reading is "120 over 80" (or 120/80). If you are an adult and do not have  a medical condition, use this guide to find out if your blood pressure is normal: Normal  First number: below 120.  Second number: below 80. Elevated  First number: 120-129.  Second number: below 80. Hypertension stage 1  First number: 130-139.  Second number: 80-89. Hypertension stage 2  First number: 140 or above.  Second number: 64 or above. Your blood pressure is above normal even if only the top or bottom number is above normal. Follow these instructions at home:  Check your blood pressure as often as your doctor tells you to.  Take your monitor to your next doctor's appointment. Your doctor will: ? Make sure you are using it correctly. ? Make sure it is working right.  Make sure you understand what your blood pressure numbers should be.  Tell your doctor if your medicines are causing side effects. Contact a doctor if:  Your blood pressure keeps being high. Get help right away if:  Your first blood pressure number is higher than 180.  Your second blood pressure number is higher than 120. This information is not intended to replace advice given to you by your health care provider. Make sure you discuss any questions you have with your health care provider. Document Revised: 12/15/2016 Document Reviewed: 06/11/2015 Elsevier Patient Education  2020 [employer].

## 2019-02-25 NOTE — Telephone Encounter (Signed)

## 2019-02-25 NOTE — Telephone Encounter (Signed)
Post procedure phone call.  Patient states he is doing good.  

## 2019-03-04 ENCOUNTER — Encounter: Payer: Self-pay | Admitting: Cardiovascular Disease

## 2019-03-04 LAB — COMPLIANCE DRUG ANALYSIS, UR

## 2019-03-04 NOTE — Telephone Encounter (Signed)
This encounter was created in error - please disregard.

## 2019-03-06 NOTE — Telephone Encounter (Signed)
Looks good to me

## 2019-03-10 ENCOUNTER — Telehealth: Payer: Self-pay | Admitting: Cardiovascular Disease

## 2019-03-10 ENCOUNTER — Encounter: Payer: Self-pay | Admitting: Student in an Organized Health Care Education/Training Program

## 2019-03-10 ENCOUNTER — Telehealth: Payer: Self-pay | Admitting: *Deleted

## 2019-03-10 NOTE — Telephone Encounter (Signed)
Attempted to call for pre appointment review of allergies/meds. Message left. 

## 2019-03-10 NOTE — Telephone Encounter (Signed)
Patient needs a letter stating he has no restrictions for working.

## 2019-03-10 NOTE — Telephone Encounter (Signed)
Patient has sent a couple of My Chart messages. He needs letter to return to work. I did inquire what was his position and job duties. If you agree let me know and I will be happy to write letter.   I work for [employer] an [employer]. 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

## 2019-03-11 ENCOUNTER — Encounter: Payer: Self-pay | Admitting: Student in an Organized Health Care Education/Training Program

## 2019-03-11 ENCOUNTER — Other Ambulatory Visit: Payer: Self-pay

## 2019-03-11 ENCOUNTER — Ambulatory Visit
Payer: 59 | Attending: Student in an Organized Health Care Education/Training Program | Admitting: Student in an Organized Health Care Education/Training Program

## 2019-03-11 DIAGNOSIS — M1731 Unilateral post-traumatic osteoarthritis, right knee: Secondary | ICD-10-CM

## 2019-03-11 DIAGNOSIS — M5416 Radiculopathy, lumbar region: Secondary | ICD-10-CM

## 2019-03-11 DIAGNOSIS — M65332 Trigger finger, left middle finger: Secondary | ICD-10-CM | POA: Diagnosis not present

## 2019-03-11 DIAGNOSIS — I480 Paroxysmal atrial fibrillation: Secondary | ICD-10-CM

## 2019-03-11 DIAGNOSIS — M19031 Primary osteoarthritis, right wrist: Secondary | ICD-10-CM

## 2019-03-11 DIAGNOSIS — M19032 Primary osteoarthritis, left wrist: Secondary | ICD-10-CM

## 2019-03-11 DIAGNOSIS — M4726 Other spondylosis with radiculopathy, lumbar region: Secondary | ICD-10-CM

## 2019-03-11 DIAGNOSIS — M47816 Spondylosis without myelopathy or radiculopathy, lumbar region: Secondary | ICD-10-CM

## 2019-03-11 DIAGNOSIS — G894 Chronic pain syndrome: Secondary | ICD-10-CM

## 2019-03-11 DIAGNOSIS — G8929 Other chronic pain: Secondary | ICD-10-CM

## 2019-03-11 MED ORDER — TRAMADOL HCL 50 MG PO TABS: 100.0000 mg | ORAL_TABLET | Freq: Two times a day (BID) | ORAL | 2 refills | Status: DC | PRN

## 2019-03-11 NOTE — Progress Notes (Signed)
Patient: [name]  Service Category: E/M  Provider: Gillis Santa, MD  DOB: [birth year]  DOS: 03/11/2019  Location: Office  MRN: [id number]  Setting: Ambulatory outpatient  Referring Provider: Guadalupe Maple, MD  Type: Established Patient  Specialty: Interventional Pain Management  PCP: Guadalupe Maple, MD  Location: Home  Delivery: TeleHealth     Virtual Encounter - Pain Management PROVIDER NOTE: Information contained herein reflects review and annotations entered in association with encounter. Interpretation of such information and data should be left to medically-trained personnel. Information provided to patient can be located elsewhere in the medical record under "Patient Instructions". Document created using STT-dictation technology, any transcriptional errors that may result from process are unintentional.    Contact & Pharmacy Preferred: [phone] Home: [phone] (home) Mobile: [phone] (mobile) E-mail: [email or web address]  [facility] [place] [address] [place] [address] Phone: [phone] Fax: [phone]   Pre-screening  [name] offered "in-person" vs "virtual" encounter. He indicated preferring virtual for this encounter.   Reason COVID-19*  Social distancing based on CDC and AMA recommendations.   I contacted [name] on 03/11/2019 via telephone.      I clearly identified myself as Gillis Santa, MD. I verified that I was speaking with the correct person using two identifiers (Name: [name], and date of birth: [birth year]).  This visit was completed via telephone due to the restrictions of the COVID-19 pandemic. All issues as above were discussed and addressed but no physical exam was performed. If it was felt that the patient should be evaluated in the office, they were directed there. The patient verbally consented to this visit. Patient was unable to complete an  audio/visual visit due to Technical difficulties and/or Lack of internet. Due to the catastrophic nature of the COVID-19 pandemic, this visit was done through audio contact only.  Location of the patient: home address (see Epic for details)  Location of the provider: office  Consent I sought verbal advanced consent from [name] for virtual visit interactions. I informed [name] of possible security and privacy concerns, risks, and limitations associated with providing "not-in-person" medical evaluation and management services. I also informed [name] of the availability of "in-person" appointments. Finally, I informed him that there would be a charge for the virtual visit and that he could be  personally, fully or partially, financially responsible for it. [name] expressed understanding and agreed to proceed.   Historic Elements   [name] is a 64 y.o. year old, male patient evaluated today after his last contact with our practice on 03/10/2019. [name]  has a past medical history of Cardiomyopathy (in setting of Afib), DJD (degenerative joint disease) of knee, History of kidney stones, Intervertebral disc disorder with radiculopathy of lumbosacral region, Kidney stone (2012), Lower extremity edema, PAF (paroxysmal atrial fibrillation) ([place]), Pernicious anemia, Prostate cancer ([place]), Pulmonary nodule, right, and SVT (supraventricular tachycardia) ([place]). He also  has a past surgical history that includes Knee surgery; Gastric bypass (2002); prostate seeding; Carpal tunnel release (Right, 12/23/2014); Carpal tunnel release (Left, 01/06/2015); and Colonoscopy with propofol (N/A, 10/08/2017). [name] has a current medication list which includes the following prescription(s): amiodarone, amlodipine, benazepril, cyanocobalamin, cyclobenzaprine, ezetimibe, gabapentin, metoprolol succinate, mirtazapine, rivaroxaban, rosuvastatin, tramadol, and trazodone. He  reports that he quit smoking  about 40 years ago. His smoking use included cigarettes. He has a  10.00 pack-year smoking history. He has quit using smokeless tobacco.  His smokeless tobacco use included snuff. He reports current alcohol use of about 12.0 standard drinks of alcohol per week. He reports that he does not use drugs. [name] has No Known Allergies.   HPI  Today, he is being contacted for both, medication management and a post-procedure assessment.   Patient follows up for post procedure assessment after right dorsal scaphoid wrist steroid injection and left middle finger trigger finger injection.  He states that his right wrist continues to be painful and that the steroid injection in the scaphoid was not very helpful.    Pharmacotherapy Assessment  Analgesic:  Tramadol 100 mg twice daily as needed, #120 /month  Monitoring: [place] PMP: PDMP reviewed during this encounter.       Pharmacotherapy: No side-effects or adverse reactions reported. Compliance: No problems identified. Effectiveness: Clinically acceptable. Plan: Refer to "POC".  UDS:  Summary  Date Value Ref Range Status  02/27/2019 Note  Final    Comment:    ==================================================================== Compliance Drug Analysis, Ur ==================================================================== Test                             Result       Flag       Units Drug Present and Declared for Prescription Verification   Tramadol                       >2463        EXPECTED   ng/mg creat   O-Desmethyltramadol            1297         EXPECTED   ng/mg creat   N-Desmethyltramadol            >2463        EXPECTED   ng/mg creat    Source of tramadol is a prescription medication. O-desmethyltramadol    and N-desmethyltramadol are expected metabolites of tramadol.   Gabapentin                     PRESENT      EXPECTED   Cyclobenzaprine                PRESENT      EXPECTED   Desmethylcyclobenzaprine       PRESENT      EXPECTED     Desmethylcyclobenzaprine is an expected metabolite of    cyclobenzaprine.   Mirtazapine                    PRESENT      EXPECTED   Trazodone                      PRESENT      EXPECTED   1,3 chlorophenyl piperazine    PRESENT      EXPECTED    1,3-chlorophenyl piperazine is an expected metabolite of trazodone.   Metoprolol                     PRESENT      EXPECTED Drug Present not Declared for Prescription Verification   Acetaminophen                  PRESENT      UNEXPECTED   Ibuprofen  PRESENT      UNEXPECTED   Diphenhydramine                PRESENT      UNEXPECTED ==================================================================== Test                      Result    Flag   Units      Ref Range   Creatinine              203              mg/dL      >=20 ==================================================================== Declared Medications:  The flagging and interpretation on this report are based on the  following declared medications.  Unexpected results may arise from  inaccuracies in the declared medications.  **Note: The testing scope of this panel includes these medications:  Cyclobenzaprine  Gabapentin  Metoprolol  Mirtazapine  Tramadol  Trazodone  **Note: The testing scope of this panel does not include the  following reported medications:  Alendronate  Amlodipine  Benazepril  Cyanocobalamin  Ezetimibe  Rivaroxaban  Rosuvastatin ==================================================================== For clinical consultation, please call [phone]. ====================================================================    Laboratory Chemistry Profile   Renal Lab Results  Component Value Date   BUN 12 08/13/2018   CREATININE 1.00 08/13/2018   BCR 12 08/13/2018   GFRAA 92 08/13/2018   GFRNONAA 79 08/13/2018    Hepatic Lab Results  Component Value Date   AST 19 07/25/2018   ALT 19 07/25/2018   ALBUMIN 4.6 07/25/2018   ALKPHOS 72  07/25/2018   LIPASE 28 03/13/2017    Electrolytes Lab Results  Component Value Date   NA 141 08/13/2018   K 4.7 08/13/2018   CL 106 08/13/2018   CALCIUM 8.4 (L) 08/13/2018    Bone No results found for: VD25OH, VD125OH2TOT, UU7253GU4, QI3474QV9, 25OHVITD1, 25OHVITD2, 25OHVITD3, TESTOFREE, TESTOSTERONE  Inflammation (CRP: Acute Phase) (ESR: Chronic Phase) No results found for: CRP, ESRSEDRATE, LATICACIDVEN    Note: Above Lab results reviewed.  Imaging  DG PAIN CLINIC C-ARM 1-60 MIN NO REPORT Fluoro was used, but no Radiologist interpretation will be provided.  Please refer to "NOTES" tab for provider progress note.  Assessment  The primary encounter diagnosis was Trigger middle finger of left hand. Diagnoses of Primary osteoarthritis of both wrists, Chronic radicular lumbar pain, Lumbar radiculopathy, Morbid obesity (HCC), Lumbar facet joint syndrome, Lumbar facet arthropathy, Paroxysmal atrial fibrillation (HCC), Chronic pain syndrome, and Post-traumatic osteoarthritis of right knee were also pertinent to this visit.  Plan of Care  [name] has a current medication list which includes the following long-term medication(s): amiodarone, amlodipine, benazepril, ezetimibe, gabapentin, metoprolol succinate, mirtazapine, rivaroxaban, rosuvastatin, and trazodone.  Patient likely has advanced osteoarthritis of right wrist.  We did this procedure under fluoroscopy and localized the joint space very well.  Discussed topical therapy and heat.  Also discussed immersing right hand in warm sitz bath.   Patient did find benefit with his left trigger finger injection at the A1 pulley of his middle finger.  He is having difficulty at the A3 pulley.  We discussed repeating injection covering the A1 and A3 pulley of his left hand middle finger.  Patient will contact us if he wants to proceed.  We will also take over the patient's tramadol prescription.  Patient instructed to sign pain management  contract next time he comes into clinic.  Pharmacotherapy (Medications Ordered): Meds ordered this encounter  Medications  . traMADol Veatrice Bourbon)  50 MG tablet    Sig: Take 2 tablets (100 mg total) by mouth 2 (two) times daily as needed for moderate pain or severe pain.    Dispense:  120 tablet    Refill:  2   Orders:  Orders Placed This Encounter  Procedures  . TRIGGER POINT INJECTION    Standing Status:   Standing    Number of Occurrences:   6    Standing Expiration Date:   03/10/2020    Order Specific Question:   Where will this procedure be performed?    Answer:   [facility]   Follow-up plan:   Return in about 3 months (around 06/08/2019) for Medication Management.     s/p lesi #1 (left L4/5) on 8/17, #2 on 10/21/2018 (left L3/4), #3 on 12/09/2018 (Left L3/4); 02/24/19: s/p right wrist injection- dorsal scaphoid not helpful, left trigger finger (middle finger) injection at A1 pulley helpful, repeat prn and do A1+ A3 pully as patient still has distal discomfort and limited ROM.     Recent Visits Date Type Provider Dept  02/24/19 Procedure visit Gillis Santa, MD [facility]  02/17/19 Office Visit Gillis Santa, MD [facility]  Showing recent visits within past 90 days and meeting all other requirements   Today's Visits Date Type Provider Dept  03/11/19 Office Visit Gillis Santa, MD [facility]  Showing today's visits and meeting all other requirements   Future Appointments No visits were found meeting these conditions.  Showing future appointments within next 90 days and meeting all other requirements   I discussed the assessment and treatment plan with the patient. The patient was provided an opportunity to ask questions and all were answered. The patient agreed with the plan and demonstrated an understanding of the instructions.  Patient advised to call back or seek an in-person evaluation if the symptoms or condition worsens.  Duration  of encounter: 25 minutes.  Note by: Gillis Santa, MD Date: 03/11/2019; Time: 8:42 AM

## 2019-03-12 ENCOUNTER — Encounter: Payer: Self-pay | Admitting: *Deleted

## 2019-03-12 NOTE — Telephone Encounter (Signed)
Letter written and My Chart message sent to patient.

## 2019-03-12 NOTE — Telephone Encounter (Signed)
We can write a letter, no restrictions

## 2019-03-17 NOTE — Telephone Encounter (Signed)
Can we confirm he got his note

## 2019-03-24 ENCOUNTER — Ambulatory Visit: Admission: RE | Admit: 2019-03-24 | Payer: 59 | Source: Ambulatory Visit

## 2019-03-24 ENCOUNTER — Ambulatory Visit
Payer: 59 | Attending: Student in an Organized Health Care Education/Training Program | Admitting: Student in an Organized Health Care Education/Training Program

## 2019-03-24 ENCOUNTER — Encounter: Payer: Self-pay | Admitting: Student in an Organized Health Care Education/Training Program

## 2019-03-24 ENCOUNTER — Other Ambulatory Visit: Payer: Self-pay

## 2019-03-24 DIAGNOSIS — M65332 Trigger finger, left middle finger: Secondary | ICD-10-CM | POA: Diagnosis not present

## 2019-03-24 DIAGNOSIS — M79642 Pain in left hand: Secondary | ICD-10-CM | POA: Insufficient documentation

## 2019-03-24 MED ORDER — DEXAMETHASONE SODIUM PHOSPHATE 10 MG/ML IJ SOLN
10.0000 mg | Freq: Once | INTRAMUSCULAR | Status: AC
  Administered 2019-03-24: 10 mg

## 2019-03-24 MED ORDER — LIDOCAINE HCL 2 % IJ SOLN
20.0000 mL | Freq: Once | INTRAMUSCULAR | Status: AC
  Administered 2019-03-24: 400 mg

## 2019-03-24 MED ORDER — LIDOCAINE HCL 2 % IJ SOLN
INTRAMUSCULAR | Status: AC
  Filled 2019-03-24: qty 20

## 2019-03-24 MED ORDER — DEXAMETHASONE SODIUM PHOSPHATE 10 MG/ML IJ SOLN
INTRAMUSCULAR | Status: AC
  Filled 2019-03-24: qty 1

## 2019-03-24 NOTE — Progress Notes (Signed)
Safety precautions to be maintained throughout the outpatient stay will include: orient to surroundings, keep bed in low position, maintain call bell within reach at all times, provide assistance with transfer out of bed and ambulation.  

## 2019-03-24 NOTE — Progress Notes (Signed)
PROVIDER NOTE: Information contained herein reflects review and annotations entered in association with encounter. Interpretation of such information and data should be left to medically-trained personnel. Information provided to patient can be located elsewhere in the medical record under "Patient Instructions". Document created using STT-dictation technology, any transcriptional errors that may result from process are unintentional.    Patient: [name]  Service Category: Procedure  Provider: Gillis Santa, MD  DOB: Apr 17, 1955  DOS: 03/24/2019  Location: [facility]  MRN: [id number]  Setting: Ambulatory - outpatient  Referring Provider: Gillis Santa, MD  Type: Established Patient  Specialty: Interventional Pain Management  PCP: Guadalupe Maple, MD   Primary Reason for Visit: Interventional Pain Management Treatment. CC: Hand Pain (left hand 3rd digit )  Procedure:          Anesthesia, Analgesia, Anxiolysis:  Type: Trigger Finger Ligament/Tendon sheath (20550) Injection.  #1  Purpose: Diagnostic Target Area: Flexor Digitorum Tendon sheath nodule Region: A-2 pulley of the metacarpal area Approach: Percutaneous Digit: No:3(Middle) Finger Laterality: Left-hand  Type: Local Anesthesia Indication(s): Analgesia         Local Anesthetic: Lidocaine 1-2% Route: Infiltration ([facility]/IM) IV Access: Declined Sedation: Declined   Position: Sitting   Indications: 1. Trigger middle finger of left hand    Pain Score: Pre-procedure: 4 /10 Post-procedure: 4 /10   Pre-op Assessment:  [name] is a 64 y.o. (year old), male patient, seen today for interventional treatment. He  has a past surgical history that includes Knee surgery; Gastric bypass (2002); prostate seeding; Carpal tunnel release (Right, 12/23/2014); Carpal tunnel release (Left, 01/06/2015); and Colonoscopy with propofol (N/A, 10/08/2017). [name] has a current medication list which includes the following  prescription(s): amlodipine, benazepril, cyanocobalamin, cyclobenzaprine, ezetimibe, gabapentin, metoprolol succinate, mirtazapine, rivaroxaban, rosuvastatin, tramadol, trazodone, and amiodarone. His primarily concern today is the Hand Pain (left hand 3rd digit )  Initial Vital Signs:  Pulse/HCG Rate: 82  Temp: (!) 97.2 F (36.2 C) Resp: 16 BP: (!) 152/104(took BP medicine this morning.) SpO2: 96 %  BMI: Estimated body mass index is 49.1 kg/m as calculated from the following:   Height as of this encounter: 6' (1.829 m).   Weight as of this encounter: 362 lb (164.2 kg).  Risk Assessment: Allergies: Reviewed. He has No Known Allergies.  Allergy Precautions: None required Coagulopathies: Reviewed. None identified.  Blood-thinner therapy: None at this time Active Infection(s): Reviewed. None identified. [name] is afebrile  Site Confirmation: [name] was asked to confirm the procedure and laterality before marking the site Procedure checklist: Completed Consent: Before the procedure and under the influence of no sedative(s), amnesic(s), or anxiolytics, the patient was informed of the treatment options, risks and possible complications. To fulfill our ethical and legal obligations, as recommended by the American Medical Association's Code of Ethics, I have informed the patient of my clinical impression; the nature and purpose of the treatment or procedure; the risks, benefits, and possible complications of the intervention; the alternatives, including doing nothing; the risk(s) and benefit(s) of the alternative treatment(s) or procedure(s); and the risk(s) and benefit(s) of doing nothing. The patient was provided information about the general risks and possible complications associated with the procedure. These may include, but are not limited to: failure to achieve desired goals, infection, bleeding, organ or nerve damage, allergic reactions, paralysis, and death. In addition, the patient  was informed of those risks and complications associated to the procedure, such as failure to decrease pain; infection; bleeding; organ or nerve  damage with subsequent damage to sensory, motor, and/or autonomic systems, resulting in permanent pain, numbness, and/or weakness of one or several areas of the body; allergic reactions; (i.e.: anaphylactic reaction); and/or death. Furthermore, the patient was informed of those risks and complications associated with the medications. These include, but are not limited to: allergic reactions (i.e.: anaphylactic or anaphylactoid reaction(s)); adrenal axis suppression; blood sugar elevation that in diabetics may result in ketoacidosis or comma; water retention that in patients with history of congestive heart failure may result in shortness of breath, pulmonary edema, and decompensation with resultant heart failure; weight gain; swelling or edema; medication-induced neural toxicity; particulate matter embolism and blood vessel occlusion with resultant organ, and/or nervous system infarction; and/or aseptic necrosis of one or more joints. Finally, the patient was informed that Medicine is not an exact science; therefore, there is also the possibility of unforeseen or unpredictable risks and/or possible complications that may result in a catastrophic outcome. The patient indicated having understood very clearly. We have given the patient no guarantees and we have made no promises. Enough time was given to the patient to ask questions, all of which were answered to the patient's satisfaction. [name] has indicated that he wanted to continue with the procedure. Attestation: I, the ordering provider, attest that I have discussed with the patient the benefits, risks, side-effects, alternatives, likelihood of achieving goals, and potential problems during recovery for the procedure that I have provided informed consent. Date  Time: 03/24/2019  8:38 AM  Pre-Procedure  Preparation:  Monitoring: As per clinic protocol. Respiration, ETCO2, SpO2, BP, heart rate and rhythm monitor placed and checked for adequate function Safety Precautions: Patient was assessed for positional comfort and pressure points before starting the procedure. Time-out: I initiated and conducted the "Time-out" before starting the procedure, as per protocol. The patient was asked to participate by confirming the accuracy of the "Time Out" information. Verification of the correct person, site, and procedure were performed and confirmed by me, the nursing staff, and the patient. "Time-out" conducted as per Joint Commission's Universal Protocol (UP.01.01.01). Time: 0915  Description of Procedure:          Area Prepped: Entire palmar and dorsal aspect of hand, up to forearm area. Prepping solution: DuraPrep (Iodine Povacrylex [0.7% available iodine] and Isopropyl Alcohol, 74% w/w) Safety Precautions: Aspiration looking for blood return was conducted prior to all injections. At no point did we inject any substances, as a needle was being advanced. No attempts were made at seeking any paresthesias. Safe injection practices and needle disposal techniques used. Medications properly checked for expiration dates. SDV (single dose vial) medications used. Description of the Procedure: Protocol guidelines were followed. The patient was placed in position. The target area was identified and prepped in the usual manner. Skin & deeper tissues infiltrated with local anesthetic. Appropriate time provided for local anesthetics to take effect. The procedure needle was slowly advanced to target area. Proper needle placement secured. Negative aspiration confirmed. Solution injected in intermittent fashion, asking for systemic symptoms every 0.5cc. Needle(s) removed and area cleaned, making sure to leave some prepping solution back to take advantage of its long term bactericidal properties.  Vitals:   03/24/19 0849  BP:  (!) 152/104  Pulse: 82  Resp: 16  Temp: (!) 97.2 F (36.2 C)  TempSrc: Temporal  SpO2: 96%  Weight: (!) 362 lb (164.2 kg)  Height: 6' (1.829 m)    Start Time: 0915 hrs. End Time: 0918 hrs. Materials: 30-gauge 0.5 inch needle  on TB syringe with 0.5 cc of 0.2% ropivacaine along with 0.5 cc of Decadron 10 mg/cc.  Injected into the A2 ligament.  Medication(s): Please see orders for medications and dosing details.    Imaging Guidance:          Type of Imaging Technique: None used Indication(s): N/A Exposure Time: No patient exposure Contrast: None used. Fluoroscopic Guidance: N/A Ultrasound Guidance: N/A Interpretation: N/A  Post-operative Assessment:  Post-procedure Vital Signs:  Pulse/HCG Rate: 82  Temp: (!) 97.2 F (36.2 C) Resp: 16 BP: (!) 152/104(took BP medicine this morning.) SpO2: 96 %  EBL: None  Complications: No immediate post-treatment complications observed by team, or reported by patient.  Note: The patient tolerated the entire procedure well. A repeat set of vitals were taken after the procedure and the patient was kept under observation following institutional policy, for this type of procedure. Post-procedural neurological assessment was performed, showing return to baseline, prior to discharge. The patient was provided with post-procedure discharge instructions, including a section on how to identify potential problems. Should any problems arise concerning this procedure, the patient was given instructions to immediately contact us, at any time, without hesitation. In any case, we plan to contact the patient by telephone for a follow-up status report regarding this interventional procedure.  Comments:  No additional relevant information.  Plan of Care  Orders:  Orders Placed This Encounter  Procedures  . [facility] C-ARM 1-60 MIN NO REPORT    Intraoperative interpretation by procedural physician at [facility].    Standing Status:    Standing    Number of Occurrences:   1    Order Specific Question:   Reason for exam:    Answer:   Assistance in needle guidance and placement for procedures requiring needle placement in or near specific anatomical locations not easily accessible without such assistance.    Medications ordered for procedure: Meds ordered this encounter  Medications  . lidocaine (XYLOCAINE) 2 % (with pres) injection 400 mg  . dexamethasone (DECADRON) injection 10 mg   Medications administered: We administered lidocaine and dexamethasone.  See the medical record for exact dosing, route, and time of administration.  Follow-up plan:   Return for [facility] appt.      s/p lesi #1 (left L4/5) on 8/17, #2 on 10/21/2018 (left L3/4), #3 on 12/09/2018 (Left L3/4); 02/24/19: s/p right wrist injection- dorsal scaphoid not helpful, left trigger finger (middle finger) injection at A1 pulley helpful, repeat A3 pully 03/24/2019    Recent Visits Date Type Provider Dept  03/11/19 Office Visit Gillis Santa, MD [facility]  02/24/19 Procedure visit Gillis Santa, MD [facility]  02/17/19 Office Visit Gillis Santa, MD [facility]  Showing recent visits within past 90 days and meeting all other requirements   Today's Visits Date Type Provider Dept  03/24/19 Procedure visit Gillis Santa, MD [facility]  Showing today's visits and meeting all other requirements   Future Appointments Date Type Provider Dept  06/05/19 Appointment Gillis Santa, MD [facility]  Showing future appointments within next 90 days and meeting all other requirements   Disposition: Discharge home  Discharge (Date  Time): 03/24/2019; 0918 hrs.   Primary Care Physician: Guadalupe Maple, MD Location: [facility] Note by: Gillis Santa, MD Date: 03/24/2019; Time: 9:27 AM  Disclaimer:  Medicine is not an exact science. The only guarantee in medicine is that nothing is  guaranteed. It is important to  note that the decision to proceed with this intervention was based on the information collected from the patient. The Data and conclusions were drawn from the patient's questionnaire, the interview, and the physical examination. Because the information was provided in large part by the patient, it cannot be guaranteed that it has not been purposely or unconsciously manipulated. Every effort has been made to obtain as much relevant data as possible for this evaluation. It is important to note that the conclusions that lead to this procedure are derived in large part from the available data. Always take into account that the treatment will also be dependent on availability of resources and existing treatment guidelines, considered by other Pain Management Practitioners as being common knowledge and practice, at the time of the intervention. For Medico-Legal purposes, it is also important to point out that variation in procedural techniques and pharmacological choices are the acceptable norm. The indications, contraindications, technique, and results of the above procedure should only be interpreted and judged by a Board-Certified Interventional Pain Specialist with extensive familiarity and expertise in the same exact procedure and technique.

## 2019-03-24 NOTE — Patient Instructions (Signed)

## 2019-03-25 ENCOUNTER — Telehealth: Payer: Self-pay
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
## 2019-03-25 NOTE — Telephone Encounter (Signed)
Post procedure phone call.  Patient states he is doing good today.  

## 2019-03-26 ENCOUNTER — Encounter: Payer: Self-pay | Admitting: Cardiovascular Disease

## 2019-03-26 ENCOUNTER — Telehealth (INDEPENDENT_AMBULATORY_CARE_PROVIDER_SITE_OTHER): Payer: 59 | Admitting: Cardiovascular Disease

## 2019-03-26 ENCOUNTER — Other Ambulatory Visit: Payer: Self-pay

## 2019-03-26 VITALS — BP 126/88 | HR 96 | Ht 72.0 in | Wt 362.0 lb

## 2019-03-26 DIAGNOSIS — Z6841 Body Mass Index (BMI) 40.0 and over, adult: Secondary | ICD-10-CM

## 2019-03-26 DIAGNOSIS — I10 Essential (primary) hypertension: Secondary | ICD-10-CM

## 2019-03-26 DIAGNOSIS — I2510 Atherosclerotic heart disease of native coronary artery without angina pectoris: Secondary | ICD-10-CM

## 2019-03-26 DIAGNOSIS — I480 Paroxysmal atrial fibrillation: Secondary | ICD-10-CM | POA: Diagnosis not present

## 2019-03-26 DIAGNOSIS — E782 Mixed hyperlipidemia: Secondary | ICD-10-CM | POA: Diagnosis not present

## 2019-03-26 MED ORDER — AMIODARONE HCL 200 MG PO TABS: ORAL_TABLET | ORAL | 0 refills | Status: DC

## 2019-03-26 NOTE — Patient Instructions (Addendum)
Medication Instructions:  Your physician has recommended you make the following change in your medication:   1. START: Amiodarone 200 mg take two tablets ( 400 mg) by mouth twice daily for seven days. Then decrease to 200 mg take one tablet by mouth twice daily.   If you need a refill on your cardiac medications before your next appointment, please call your pharmacy.    Lab work: No new labs needed   If you have labs (blood work) drawn today and your tests are completely normal, you will receive your results only by: Marland Kitchen MyChart Message (if you have MyChart) OR . A paper copy in the mail If you have any lab test that is abnormal or we need to change your treatment, we will call you to review the results.   Testing/Procedures: EKG scheduled on March 31st 2021. Come to the medical mall at 10:30AM.    Follow-Up: At [facility], you and your health needs are our priority.  As part of our continuing mission to provide you with exceptional heart care, we have created designated Provider Care Teams.  These Care Teams include your primary Cardiologist (physician) and Advanced Practice Providers (APPs -  Physician Assistants and Nurse Practitioners) who all work together to provide you with the care you need, when you need it.  . You will need a follow up appointment in 6 weeks    . Providers on your designated Care Team:   . Murray Hodgkins, NP . Christell Faith, PA-C . Marrianne Mood, PA-C  Any Other Special Instructions Will Be Listed Below (If Applicable).  COVID-19 Vaccine Information can be found at: ShippingScam.co.uk For questions related to vaccine distribution or appointments, please email vaccine@[email or web address] or call [phone].

## 2019-04-16 ENCOUNTER — Other Ambulatory Visit: Payer: Self-pay

## 2019-04-16 ENCOUNTER — Ambulatory Visit
Admission: RE | Admit: 2019-04-16 | Discharge: 2019-04-16 | Disposition: A | Discharge status: Home / Self Care | Payer: 59 | Source: Ambulatory Visit | Attending: Cardiovascular Disease | Admitting: Cardiovascular Disease

## 2019-04-16 ENCOUNTER — Ambulatory Visit: Payer: 59

## 2019-04-16 DIAGNOSIS — I480 Paroxysmal atrial fibrillation: Secondary | ICD-10-CM | POA: Diagnosis present

## 2019-05-05 ENCOUNTER — Emergency Department: Payer: 59

## 2019-05-05 ENCOUNTER — Emergency Department
Admission: EM | Admit: 2019-05-05 | Discharge: 2019-05-05 | Disposition: A | Discharge status: Home / Self Care | Payer: 59 | Attending: Emergency Medicine | Admitting: Emergency Medicine

## 2019-05-05 ENCOUNTER — Other Ambulatory Visit: Payer: Self-pay

## 2019-05-05 DIAGNOSIS — R569 Unspecified convulsions: Secondary | ICD-10-CM | POA: Diagnosis not present

## 2019-05-05 DIAGNOSIS — M79602 Pain in left arm: Secondary | ICD-10-CM | POA: Insufficient documentation

## 2019-05-05 DIAGNOSIS — I10 Essential (primary) hypertension: Secondary | ICD-10-CM | POA: Insufficient documentation

## 2019-05-05 DIAGNOSIS — M79601 Pain in right arm: Secondary | ICD-10-CM | POA: Diagnosis not present

## 2019-05-05 DIAGNOSIS — R42 Dizziness and giddiness: Secondary | ICD-10-CM | POA: Diagnosis not present

## 2019-05-05 DIAGNOSIS — Z8546 Personal history of malignant neoplasm of prostate: Secondary | ICD-10-CM | POA: Diagnosis not present

## 2019-05-05 DIAGNOSIS — Z79899 Other long term (current) drug therapy: Secondary | ICD-10-CM | POA: Diagnosis not present

## 2019-05-05 DIAGNOSIS — Z87891 Personal history of nicotine dependence: Secondary | ICD-10-CM | POA: Diagnosis not present

## 2019-05-05 DIAGNOSIS — Z7901 Long term (current) use of anticoagulants: Secondary | ICD-10-CM | POA: Diagnosis not present

## 2019-05-05 DIAGNOSIS — R079 Chest pain, unspecified: Secondary | ICD-10-CM | POA: Insufficient documentation

## 2019-05-05 LAB — TROPONIN I (HIGH SENSITIVITY)
Troponin I (High Sensitivity): 4 ng/L (ref <18)
Troponin I (High Sensitivity): 4 ng/L (ref <18)

## 2019-05-05 LAB — URINALYSIS, COMPLETE (UACMP) WITH MICROSCOPIC
Bacteria, UA: NONE SEEN
Bilirubin Urine: NEGATIVE
Glucose, UA: NEGATIVE mg/dL
Hgb urine dipstick: NEGATIVE
Ketones, ur: NEGATIVE mg/dL
Leukocytes,Ua: NEGATIVE
Nitrite: NEGATIVE
Protein, ur: NEGATIVE mg/dL
Specific Gravity, Urine: 1.025 (ref 1.005–1.030)
pH: 5 (ref 5.0–8.0)

## 2019-05-05 LAB — BASIC METABOLIC PANEL
Anion gap: 11 (ref 5–15)
BUN: 18 mg/dL (ref 8–23)
CO2: 24 mmol/L (ref 22–32)
Calcium: 8.8 mg/dL — ABNORMAL LOW (ref 8.9–10.3)
Chloride: 106 mmol/L (ref 98–111)
Creatinine, Ser: 1.14 mg/dL (ref 0.61–1.24)
GFR calc Af Amer: >60 mL/min (ref >60)
GFR calc non Af Amer: >60 mL/min (ref >60)
Glucose, Bld: 94 mg/dL (ref 70–99)
Potassium: 4.3 mmol/L (ref 3.5–5.1)
Sodium: 141 mmol/L (ref 135–145)

## 2019-05-05 LAB — CBC
HCT: 41.9 % (ref 39.0–52.0)
Hemoglobin: 13.1 g/dL (ref 13.0–17.0)
MCH: 25.5 pg — ABNORMAL LOW (ref 26.0–34.0)
MCHC: 31.3 g/dL (ref 30.0–36.0)
MCV: 81.5 fL (ref 80.0–100.0)
Platelets: 187 10*3/uL (ref 150–400)
RBC: 5.14 MIL/uL (ref 4.22–5.81)
RDW: 15.9 % — ABNORMAL HIGH (ref 11.5–15.5)
WBC: 6.7 10*3/uL (ref 4.0–10.5)
nRBC: 0 % (ref 0.0–0.2)

## 2019-05-05 LAB — MAGNESIUM: Magnesium: 2 mg/dL (ref 1.7–2.4)

## 2019-05-05 LAB — TSH: TSH: 2.749 u[IU]/mL (ref 0.350–4.500)

## 2019-05-05 LAB — CK: Total CK: 162 U/L (ref 49–397)

## 2019-05-05 MED ORDER — SODIUM CHLORIDE 0.9% FLUSH
3.0000 mL | Freq: Once | INTRAVENOUS | Status: AC
  Administered 2019-05-05: 3 mL via INTRAVENOUS

## 2019-05-05 NOTE — ED Provider Notes (Signed)
[facility] Emergency Department Provider Note  Time seen: 7:48 PM  I have reviewed the triage vital signs and the nursing notes.   HISTORY  Chief Complaint Chest Pain   HPI [name] is a 64 y.o. male with a past medical history of atrial fibrillation on Xarelto, anemia, chronic pain, obesity, presents to the emergency department for chest pain and "convulsions."  Patient states he has been experiencing intermittent chest pain over the past few months.  Has been following up with his cardiologist regarding this.  Patient is on amiodarone for atrial fibrillation.  States he has been experiencing dizzy symptoms that have been progressively getting worse.  He describes the dizziness as lightheaded where he feels like he has to hold onto the wall or he will fall down.  Patient states he had a particularly bad episode today while at work, states he would have fallen over if he did not grab onto the wall.  States he had "convulsions" of his chest and arms which he describes as his chest thumping/pulsating.  Patient states his arms were quite sore after the ordeal.  Never lost consciousness.  Patient decided to come to the emergency department for evaluation.  While waiting to be seen patient states his symptoms have since largely resolved.  Denies any chest pain currently.  Past Medical History:  Diagnosis Date  . Cardiomyopathy (in setting of Afib)    a. 12/2013 Echo: EF 45-50%, mild ant and antsept HK. mild MR. Mod dil LA. nl RV fxn. Rhythm was Afib.  Marland Kitchen DJD (degenerative joint disease) of knee   . History of kidney stones   . Intervertebral disc disorder with radiculopathy of lumbosacral region   . Kidney stone 2012  . Lower extremity edema   . PAF (paroxysmal atrial fibrillation) ([place])   . Pernicious anemia   . Prostate cancer ([place])   . Pulmonary nodule, right    a. 10/2014 Cardiac CTA: 42mm RLL nodule; b. 04/2015 CT Chest: stable 53mm RLL nodule. No new nodules;  02/2017 CTA Chest: stable, benign, 54mm RLL pulm nodule.  . SVT (supraventricular tachycardia) [facility])     Patient Active Problem List   Diagnosis Date Noted  . Primary osteoarthritis of both wrists 02/17/2019  . Trigger middle finger of left hand 02/17/2019  . Chronic pain syndrome 02/17/2019  . Chronic radicular lumbar pain 10/08/2018  . Lumbar radiculopathy 10/08/2018  . Lumbar degenerative disc disease 10/08/2018  . Lumbar facet arthropathy 10/08/2018  . Lumbar facet joint syndrome 10/08/2018  . URI (upper respiratory infection) 07/15/2018  . Intervertebral disc disorder with radiculopathy of lumbosacral region   . Bright red rectal bleeding 08/08/2017  . Abdominal pain 08/08/2017  . Angina pectoris ([place]) 03/13/2017  . Pernicious anemia 05/22/2016  . Paroxysmal atrial fibrillation ([place]) 12/20/2015  . DJD (degenerative joint disease) of knee 10/27/2014  . Bilateral carpal tunnel syndrome 10/27/2014  . Morbid obesity ([place]) 10/27/2014  . Hyperlipidemia 10/27/2014  . H/O gastric bypass 03/20/2014  . Hyperkalemia 03/20/2014  . History of prostate cancer 12/26/2013  . Essential hypertension 12/26/2013  . Encounter for anticoagulation discussion and counseling 12/26/2013    Past Surgical History:  Procedure Laterality Date  . CARPAL TUNNEL RELEASE Right 12/23/2014   Procedure: CARPAL TUNNEL RELEASE;  Surgeon: Earnestine Leys, MD;  Location: [facility] ORS;  Service: Orthopedics;  Laterality: Right;  . CARPAL TUNNEL RELEASE Left 01/06/2015   Procedure: CARPAL TUNNEL RELEASE;  Surgeon: Earnestine Leys, MD;  Location: [facility] ORS;  Service:  Orthopedics;  Laterality: Left;  . COLONOSCOPY WITH PROPOFOL N/A 10/08/2017   Procedure: COLONOSCOPY WITH PROPOFOL;  Surgeon: Lin Landsman, MD;  Location: [facility] ENDOSCOPY;  Service: Gastroenterology;  Laterality: N/A;  . GASTRIC BYPASS  2002  . KNEE SURGERY     knee trauma  . prostate seeding      Prior to Admission medications   Medication Sig Start  Date End Date Taking? Authorizing Provider  amiodarone (PACERONE) 200 MG tablet Take 2 tablets (400 mg total) by mouth 2 (two) times daily for 7 days, THEN 1 tablet (200 mg total) 2 (two) times daily for 23 days. 03/26/19 04/25/19  Minna Merritts, MD  amLODipine (NORVASC) 5 MG tablet Take 1 tablet (5 mg total) by mouth daily. 02/25/19   Minna Merritts, MD  benazepril (LOTENSIN) 40 MG tablet Take 1 tablet (40 mg total) by mouth daily. 02/25/19   Minna Merritts, MD  cyanocobalamin (,VITAMIN B-12,) 1000 MCG/ML injection INJECT 1 ML IN THE MUSCLE EVERY 30 DAYS 08/05/18   Guadalupe Maple, MD  cyclobenzaprine (FLEXERIL) 10 MG tablet TAKE 1 TABLET(10 MG) BY MOUTH THREE TIMES DAILY AS NEEDED FOR MUSCLE SPASMS 01/06/19   Volney American, PA-C  ezetimibe (ZETIA) 10 MG tablet TAKE 1 TABLET(10 MG) BY MOUTH DAILY 02/25/19   Minna Merritts, MD  gabapentin (NEURONTIN) 400 MG capsule Take 1 capsule (400 mg total) by mouth 3 (three) times daily. Patient taking differently: Take 400 mg by mouth 2 (two) times daily.  12/23/14   Earnestine Leys, MD  metoprolol succinate (TOPROL-XL) 50 MG 24 hr tablet Take 1 tablet (50 mg total) by mouth daily. Take with or immediately following a meal. 02/25/19 05/26/19  Minna Merritts, MD  mirtazapine (REMERON) 15 MG tablet TAKE 1 TABLET(15 MG) BY MOUTH AT BEDTIME AS NEEDED FOR SLEEP 01/30/19   Volney American, PA-C  rivaroxaban (XARELTO) 20 MG TABS tablet TAKE 1 TABLET(20 MG) BY MOUTH DAILY 02/25/19   Minna Merritts, MD  rosuvastatin (CRESTOR) 10 MG tablet Take 1 tablet (10 mg total) by mouth daily. 02/25/19   Minna Merritts, MD  traMADol (ULTRAM) 50 MG tablet Take 2 tablets (100 mg total) by mouth 2 (two) times daily as needed for moderate pain or severe pain. 03/11/19 06/09/19  Gillis Santa, MD  traZODone (DESYREL) 100 MG tablet Take 1 tablet (100 mg total) by mouth at bedtime. 07/09/18   Guadalupe Maple, MD    No Known Allergies  Family History  Problem Relation  Age of Onset  . Heart disease Father   . Hypertension Father   . Stroke Father   . Cancer Father   . Arrhythmia Sister        A-fib  . Arrhythmia Brother        A-fib    Social History Social History   Tobacco Use  . Smoking status: Former Smoker    Packs/day: 1.00    Years: 10.00    Pack years: 10.00    Types: Cigarettes    Quit date: 02/18/1979    Years since quitting: 40.2  . Smokeless tobacco: Former Systems developer    Types: Snuff  Substance Use Topics  . Alcohol use: Yes    Alcohol/week: 12.0 standard drinks    Types: 12 Cans of beer per week  . Drug use: No    Review of Systems Constitutional: Negative for fever.  Lightheadedness/dizziness, now resolved. Cardiovascular: Positive for intermittent chest pain times months.  More significant  episode today but has since resolved. Respiratory: Negative for shortness of breath. Gastrointestinal: Negative for abdominal pain Musculoskeletal: Soreness in his arms and shoulders from "convulsing" per patient. Skin: Negative for skin complaints  Neurological: Negative for headache All other ROS negative  ____________________________________________   PHYSICAL EXAM:  VITAL SIGNS: ED Triage Vitals  Enc Vitals Group     BP 05/05/19 1406 (!) 136/93     Pulse Rate 05/05/19 1406 69     Resp 05/05/19 1406 18     Temp 05/05/19 1406 97.7 F (36.5 C)     Temp src --      SpO2 05/05/19 1406 98 %     Weight 05/05/19 1404 (!) 360 lb (163.3 kg)     Height 05/05/19 1404 5\' 11"  (1.803 m)     Head Circumference --      Peak Flow --      Pain Score 05/05/19 1403 4     Pain Loc --      Pain Edu? --      Excl. in [place]? --     Constitutional: Alert and oriented. Well appearing and in no distress. Eyes: Normal exam ENT      Head: Normocephalic and atraumatic.      Mouth/Throat: Mucous membranes are moist. Cardiovascular: Normal rate, regular rhythm.  Respiratory: Normal respiratory effort without tachypnea nor retractions. Breath sounds  are clear Gastrointestinal: Soft and nontender. No distention.  Musculoskeletal: Nontender with normal range of motion in all extremities. No lower extremity tenderness.  No tenderness in upper extremities or shoulders. Neurologic:  Normal speech and language. No gross focal neurologic deficits  Skin:  Skin is warm, dry and intact.  Psychiatric: Mood and affect are normal.   ____________________________________________    EKG  EKG viewed and interpreted by myself shows atrial fibrillation at 82 bpm with a narrow QRS, normal axis, largely normal intervals with no concerning ST changes.  ____________________________________________    RADIOLOGY  Chest x-ray negative  ____________________________________________   INITIAL IMPRESSION / ASSESSMENT AND PLAN / ED COURSE  Pertinent labs & imaging results that were available during my care of the patient were reviewed by me and considered in my medical decision making (see chart for details).   Patient presents to the emergency department for intermittent chest pain which he states has been getting worse over the past few months.  He also states progressively worsening dizziness/lightheadedness symptoms.  Patient is currently being treated for atrial fibrillation with amiodarone, recently went from 2 pills twice daily to 1 pill twice daily.  Patient states the dizziness mostly occurs upon standing especially in the mornings and then resolves usually by lunchtime.  Patient states today's episode was particularly bad causing chest pain as well as lightheadedness/dizziness.  Patient states he felt like his arms and chest were convulsing.  Denies LOC.  Patient is work-up thus far is reassuring including a normal chest x-ray, atrial fibrillation on EKG otherwise no acute findings.  Labs are largely within normal limits including a negative troponin.  We will repeat a troponin I have added on several additional labs including a magnesium level, TSH and  CK.  Given the patient's progressive dizziness/lightheaded feelings along with what he describes as "convulsions" we will obtain a CT scan of the head as a precaution to rule out intracranial abnormality.  Patient agreeable to plan.  Overall the patient appears well currently with a reassuring work-up thus far and reassuring vitals.  Patient's added on lab work all look  normal as well including normal magnesium CK and TSH.  Repeat troponin remains negative.  CT scan of the head is negative.  Overall reassuring work-up.  Patient has a follow-up appointment coming up with his cardiologist I discussed gust with the patient he should discuss his current medication regimen as it could possibly be medication side effect.  Patient agreeable plan of care.  Discussed return precautions.  [name] was evaluated in Emergency Department on 05/05/2019 for the symptoms described in the history of present illness. He was evaluated in the context of the global COVID-19 pandemic, which necessitated consideration that the patient might be at risk for infection with the SARS-CoV-2 virus that causes COVID-19. Institutional protocols and algorithms that pertain to the evaluation of patients at risk for COVID-19 are in a state of rapid change based on information released by regulatory bodies including the CDC and federal and state organizations. These policies and algorithms were followed during the patient's care in the ED.  ____________________________________________   FINAL CLINICAL IMPRESSION(S) / ED DIAGNOSES  Chest pain Dizziness   Harvest Dark, MD 05/05/19 2136

## 2019-05-05 NOTE — ED Notes (Signed)
Pt transported to CT ?

## 2019-05-05 NOTE — ED Triage Notes (Signed)
Pt comes via POV from home with c/o CP and convulsions. Pt states he was at work and started to have left  Sided chest pain. Pt states he then became dizzy and almost fell into the wall.  Pt stated he then had chest pain and convulsion where his chest was jerking. Pt states right arm radiation and burning. Pt also states neck too.  Pt states hx of CP but not this convulsions. Pt states weakness and wombly.  Pt states he has been experiencing moments of having swimmy head when standing.
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
## 2019-05-12 ENCOUNTER — Encounter: Payer: Self-pay | Admitting: Cardiovascular Disease

## 2019-05-12 ENCOUNTER — Encounter: Payer: Self-pay | Admitting: *Deleted

## 2019-05-12 ENCOUNTER — Other Ambulatory Visit: Payer: Self-pay

## 2019-05-12 ENCOUNTER — Ambulatory Visit (INDEPENDENT_AMBULATORY_CARE_PROVIDER_SITE_OTHER): Payer: 59 | Admitting: Cardiovascular Disease

## 2019-05-12 VITALS — BP 145/89 | HR 74 | Ht 72.0 in | Wt 362.4 lb

## 2019-05-12 DIAGNOSIS — I10 Essential (primary) hypertension: Secondary | ICD-10-CM

## 2019-05-12 DIAGNOSIS — I480 Paroxysmal atrial fibrillation: Secondary | ICD-10-CM

## 2019-05-12 DIAGNOSIS — E782 Mixed hyperlipidemia: Secondary | ICD-10-CM | POA: Diagnosis not present

## 2019-05-12 MED ORDER — AMIODARONE HCL 200 MG PO TABS: 200.0000 mg | ORAL_TABLET | Freq: Two times a day (BID) | ORAL | 3 refills | Status: DC

## 2019-05-12 NOTE — Patient Instructions (Addendum)
Medication Instructions:  Your physician has recommended you make the following change in your medication:   1. STAY on Amiodarone 200 mg twice a day 2. MOVE Benazepril to the evening   If you need a refill on your cardiac medications before your next appointment, please call your pharmacy.    Lab work: No new labs needed   If you have labs (blood work) drawn today and your tests are completely normal, you will receive your results only by: Marland Kitchen MyChart Message (if you have MyChart) OR . A paper copy in the mail If you have any lab test that is abnormal or we need to change your treatment, we will call you to review the results.   Testing/Procedures: We will schedule a cardioversion for atrial fib You are scheduled for a Cardioversion on Thursday April 29th with Dr. Rockey Situ. Please arrive at the [facility] at 06:30 a.m. on the day of your procedure.  DIET INSTRUCTIONS:  Nothing to eat or drink after midnight except your medications with a small sip of water.        1) Must have a responsible person to drive you home.  2) Bring a current list of your medications and current insurance cards.    If you have any questions after you get home, please call the office at [place] April 27th please go to the [facility] for COVID swab to be done and then go home and quarantine until the date of your procedure.   Follow-Up: At [facility], you and your health needs are our priority.  As part of our continuing mission to provide you with exceptional heart care, we have created designated Provider Care Teams.  These Care Teams include your primary Cardiologist (physician) and Advanced Practice Providers (APPs -  Physician Assistants and Nurse Practitioners) who all work together to provide you with the care you need, when you need it.  . You will need a follow up appointment in 1 month   . Providers on your designated Care Team:   . Murray Hodgkins, NP . Christell Faith, PA-C . Marrianne Mood, PA-C  Any Other Special Instructions Will Be Listed Below (If Applicable).  For educational health videos Log in to : [email or web address] Or : [email or web address], password : [email or web address]

## 2019-05-13 ENCOUNTER — Other Ambulatory Visit
Admission: RE | Admit: 2019-05-13 | Discharge: 2019-05-13 | Disposition: A | Discharge status: Home / Self Care | Payer: 59 | Source: Ambulatory Visit | Attending: Cardiovascular Disease | Admitting: Cardiovascular Disease

## 2019-05-13 DIAGNOSIS — Z20822 Contact with and (suspected) exposure to covid-19: Secondary | ICD-10-CM | POA: Diagnosis not present

## 2019-05-13 DIAGNOSIS — Z01812 Encounter for preprocedural laboratory examination: Secondary | ICD-10-CM | POA: Diagnosis present

## 2019-05-13 LAB — SARS CORONAVIRUS 2 (TAT 6-24 HRS): SARS Coronavirus 2: NEGATIVE

## 2019-05-14 ENCOUNTER — Telehealth: Payer: Self-pay | Admitting: Cardiovascular Disease

## 2019-05-14 ENCOUNTER — Other Ambulatory Visit: Payer: Self-pay | Admitting: Cardiovascular Disease

## 2019-05-14 NOTE — Telephone Encounter (Signed)
Please call to discuss cardioversion for tomorrow

## 2019-05-14 NOTE — Telephone Encounter (Signed)
Called and spoke with patient to confirm his cardioversion for tomorrow. He verbalized understanding of all instructions to include location at the [facility] and check in at the registration desk with arrival time of 06:30 AM. He was appreciative for the call with no further questions.

## 2019-05-15 ENCOUNTER — Other Ambulatory Visit: Payer: Self-pay

## 2019-05-15 ENCOUNTER — Encounter
Admission: RE | Disposition: A | Discharge status: Home / Self Care | Payer: Self-pay | Source: Home / Self Care | Attending: Cardiovascular Disease

## 2019-05-15 ENCOUNTER — Ambulatory Visit
Admission: RE | Admit: 2019-05-15 | Discharge: 2019-05-15 | Disposition: A | Discharge status: Home / Self Care | Payer: 59 | Attending: Cardiovascular Disease | Admitting: Cardiovascular Disease

## 2019-05-15 ENCOUNTER — Encounter: Payer: Self-pay | Admitting: Cardiovascular Disease

## 2019-05-15 ENCOUNTER — Ambulatory Visit: Payer: 59 | Admitting: Anesthesiology

## 2019-05-15 DIAGNOSIS — Z6841 Body Mass Index (BMI) 40.0 and over, adult: Secondary | ICD-10-CM | POA: Insufficient documentation

## 2019-05-15 DIAGNOSIS — D510 Vitamin B12 deficiency anemia due to intrinsic factor deficiency: Secondary | ICD-10-CM | POA: Insufficient documentation

## 2019-05-15 DIAGNOSIS — R5382 Chronic fatigue, unspecified: Secondary | ICD-10-CM | POA: Diagnosis not present

## 2019-05-15 DIAGNOSIS — I429 Cardiomyopathy, unspecified: Secondary | ICD-10-CM | POA: Diagnosis not present

## 2019-05-15 DIAGNOSIS — I4819 Other persistent atrial fibrillation: Secondary | ICD-10-CM | POA: Diagnosis present

## 2019-05-15 DIAGNOSIS — Z9884 Bariatric surgery status: Secondary | ICD-10-CM | POA: Diagnosis not present

## 2019-05-15 DIAGNOSIS — Z823 Family history of stroke: Secondary | ICD-10-CM | POA: Diagnosis not present

## 2019-05-15 DIAGNOSIS — E785 Hyperlipidemia, unspecified: Secondary | ICD-10-CM | POA: Diagnosis not present

## 2019-05-15 DIAGNOSIS — I2510 Atherosclerotic heart disease of native coronary artery without angina pectoris: Secondary | ICD-10-CM | POA: Insufficient documentation

## 2019-05-15 DIAGNOSIS — I10 Essential (primary) hypertension: Secondary | ICD-10-CM | POA: Insufficient documentation

## 2019-05-15 DIAGNOSIS — Z7901 Long term (current) use of anticoagulants: Secondary | ICD-10-CM | POA: Insufficient documentation

## 2019-05-15 DIAGNOSIS — Z79899 Other long term (current) drug therapy: Secondary | ICD-10-CM | POA: Diagnosis not present

## 2019-05-15 DIAGNOSIS — Z87891 Personal history of nicotine dependence: Secondary | ICD-10-CM | POA: Insufficient documentation

## 2019-05-15 DIAGNOSIS — Z8249 Family history of ischemic heart disease and other diseases of the circulatory system: Secondary | ICD-10-CM | POA: Diagnosis not present

## 2019-05-15 DIAGNOSIS — Z8546 Personal history of malignant neoplasm of prostate: Secondary | ICD-10-CM | POA: Insufficient documentation

## 2019-05-15 HISTORY — PX: CARDIOVERSION: SHX1299

## 2019-05-15 SURGERY — CARDIOVERSION
Anesthesia: General

## 2019-05-15 MED ORDER — MIDAZOLAM HCL 2 MG/2ML IJ SOLN
INTRAMUSCULAR | Status: AC
  Filled 2019-05-15: qty 2

## 2019-05-15 MED ORDER — FENTANYL CITRATE (PF) 100 MCG/2ML IJ SOLN
INTRAMUSCULAR | Status: AC
  Filled 2019-05-15: qty 2

## 2019-05-15 MED ORDER — LIDOCAINE HCL (PF) 2 % IJ SOLN
INTRAMUSCULAR | Status: AC
  Filled 2019-05-15: qty 5

## 2019-05-15 MED ORDER — MIDAZOLAM HCL 2 MG/2ML IJ SOLN
INTRAMUSCULAR | Status: DC | PRN
  Administered 2019-05-15: .5 mg via INTRAVENOUS

## 2019-05-15 MED ORDER — LIDOCAINE HCL (CARDIAC) PF 100 MG/5ML IV SOSY
PREFILLED_SYRINGE | INTRAVENOUS | Status: DC | PRN
  Administered 2019-05-15: 40 mg via INTRAVENOUS

## 2019-05-15 MED ORDER — SODIUM CHLORIDE 0.9 % IV SOLN: INTRAVENOUS | Status: DC | PRN

## 2019-05-15 MED ORDER — PROPOFOL 10 MG/ML IV BOLUS
INTRAVENOUS | Status: DC | PRN
  Administered 2019-05-15: 20 mg via INTRAVENOUS
  Administered 2019-05-15: 10 mg via INTRAVENOUS
  Administered 2019-05-15: 20 mg via INTRAVENOUS
  Administered 2019-05-15: 50 mg via INTRAVENOUS
  Administered 2019-05-15: 20 mg via INTRAVENOUS

## 2019-05-15 MED ORDER — FENTANYL CITRATE (PF) 100 MCG/2ML IJ SOLN
INTRAMUSCULAR | Status: DC | PRN
  Administered 2019-05-15: 50 ug via INTRAVENOUS

## 2019-05-15 MED ORDER — PROPOFOL 10 MG/ML IV BOLUS
INTRAVENOUS | Status: AC
  Filled 2019-05-15: qty 20

## 2019-05-15 NOTE — Anesthesia Preprocedure Evaluation (Signed)
Anesthesia Evaluation  Patient identified by MRN, date of birth, ID band Patient awake    Reviewed: Allergy & Precautions, H&P , NPO status , Patient's Chart, lab work & pertinent test results  Airway Mallampati: III  TM Distance: <3 FB Neck ROM: limited    Dental  (+) Chipped, Poor Dentition   Pulmonary neg shortness of breath, former smoker,           Cardiovascular Exercise Tolerance: Good hypertension, (-) angina(-) Past MI + dysrhythmias Atrial Fibrillation      Neuro/Psych Seizures - (2/2 A fib per patient),   Neuromuscular disease negative psych ROS   GI/Hepatic negative GI ROS, Neg liver ROS, neg GERD  ,  Endo/Other  negative endocrine ROS  Renal/GU Renal disease  negative genitourinary   Musculoskeletal  (+) Arthritis ,   Abdominal   Peds  Hematology negative hematology ROS (+)   Anesthesia Other Findings Past Medical History: No date: Cardiomyopathy (in setting of Afib)     Comment:  a. 12/2013 Echo: EF 45-50%, mild ant and antsept HK.               mild MR. Mod dil LA. nl RV fxn. Rhythm was Afib. No date: DJD (degenerative joint disease) of knee No date: History of kidney stones No date: Intervertebral disc disorder with radiculopathy of  lumbosacral region 2012: Kidney stone No date: Lower extremity edema No date: PAF (paroxysmal atrial fibrillation) (HCC) No date: Pernicious anemia No date: Prostate cancer ([place]) No date: Pulmonary nodule, right     Comment:  a. 10/2014 Cardiac CTA: 108mm RLL nodule; b. 04/2015 CT               Chest: stable 6mm RLL nodule. No new nodules; 02/2017 CTA               Chest: stable, benign, 4mm RLL pulm nodule. No date: SVT (supraventricular tachycardia) ([place])  Past Surgical History: 12/23/2014: CARPAL TUNNEL RELEASE; Right     Comment:  Procedure: CARPAL TUNNEL RELEASE;  Surgeon: Earnestine Leys, MD;  Location: [facility] ORS;  Service: Orthopedics;      Laterality: Right; 01/06/2015: CARPAL TUNNEL RELEASE; Left     Comment:  Procedure: CARPAL TUNNEL RELEASE;  Surgeon: Earnestine Leys, MD;  Location: [facility] ORS;  Service: Orthopedics;                Laterality: Left; 10/08/2017: COLONOSCOPY WITH PROPOFOL; N/A     Comment:  Procedure: COLONOSCOPY WITH PROPOFOL;  Surgeon: Lin Landsman, MD;  Location: [facility] ENDOSCOPY;  Service:               Gastroenterology;  Laterality: N/A; 2002: GASTRIC BYPASS No date: KNEE SURGERY     Comment:  knee trauma No date: prostate seeding     Reproductive/Obstetrics negative OB ROS                             Anesthesia Physical Anesthesia Plan  ASA: IV  Anesthesia Plan: General   Post-op Pain Management:    Induction: Intravenous  PONV Risk Score and Plan: Propofol infusion and TIVA  Airway Management Planned: Natural Airway and Nasal Cannula  Additional Equipment:   Intra-op Plan:  Post-operative Plan:   Informed Consent: I have reviewed the patients History and Physical, chart, labs and discussed the procedure including the risks, benefits and alternatives for the proposed anesthesia with the patient or authorized representative who has indicated his/her understanding and acceptance.     Dental Advisory Given  Plan Discussed with: Anesthesiologist, CRNA and Surgeon  Anesthesia Plan Comments: (Patient consented for risks of anesthesia including but not limited to:  - adverse reactions to medications - risk of intubation if required - damage to eyes, teeth, lips or other oral mucosa - nerve damage due to positioning  - sore throat or hoarseness - Damage to heart, brain, nerves, lungs or loss of life  Patient voiced understanding.)        Anesthesia Quick Evaluation

## 2019-05-15 NOTE — H&P (Signed)
H&P Addendum, pre-cardioversion  Patient was seen and evaluated prior to -cardioversion procedure Symptoms, prior testing details again confirmed with the patient Patient examined, no significant change from prior exam Lab work reviewed in detail personally by myself Patient understands risk and benefit of the procedure, willing to proceed  Signed, Tim Mavryk Pino, MD, Ph.D [facility]  

## 2019-05-15 NOTE — Anesthesia Procedure Notes (Signed)
Date/Time: 05/15/2019 7:40 AM Performed by: Allean Found, CRNA Pre-anesthesia Checklist: Patient identified, Emergency Drugs available, Suction available, Patient being monitored and Timeout performed Patient Re-evaluated:Patient Re-evaluated prior to induction Oxygen Delivery Method: Nasal cannula Placement Confirmation: positive ETCO2

## 2019-05-15 NOTE — CV Procedure (Signed)
Cardioversion procedure note For atrial fibrillation, persistent.  Procedure Details:  Consent: Risks of procedure as well as the alternatives and risks of each were explained to the (patient/caregiver). Consent for procedure obtained.  Time Out: Verified patient identification, verified procedure, site/side was marked, verified correct patient position, special equipment/implants available, medications/allergies/relevent history reviewed, required imaging and test results available. Performed  Patient placed on cardiac monitor, pulse oximetry, supplemental oxygen as necessary.  Sedation given: propofol IV, Dr.  Gomez Cleverly pads placed anterior and posterior chest.   Cardioverted 3 time(s).  Cardioverted at  150, 200, 200J. Synchronized biphasic Converted to NSR   Evaluation: Findings: Post procedure EKG shows: NSR Complications: None Patient did tolerate procedure well.  Time Spent Directly with the Patient:  21 minutes   Esmond Plants, M.D., Ph.D.

## 2019-05-15 NOTE — Transfer of Care (Signed)
Immediate Anesthesia Transfer of Care Note  Patient: [name]  Procedure(s) Performed: CARDIOVERSION (N/A )  Patient Location: PACU  Anesthesia Type:General  Level of Consciousness: awake and alert   Airway & Oxygen Therapy: Patient Spontanous Breathing and Patient connected to nasal cannula oxygen  Post-op Assessment: Report given to RN and Post -op Vital signs reviewed and stable  Post vital signs: Reviewed and stable  Last Vitals:  Vitals Value Taken Time  BP 106/70 05/15/19 0748  Temp    Pulse 51 05/15/19 0750  Resp 19 05/15/19 0750  SpO2 97 % 05/15/19 0750    Last Pain:  Vitals:   05/15/19 0715  TempSrc: Oral  PainSc: 0-No pain         Complications: No apparent anesthesia complications

## 2019-05-16 NOTE — Anesthesia Postprocedure Evaluation (Signed)
Anesthesia Post Note  Patient: [name]  Procedure(s) Performed: CARDIOVERSION (N/A )  Patient location during evaluation: Cath Lab Anesthesia Type: General Level of consciousness: awake and alert Pain management: pain level controlled Vital Signs Assessment: post-procedure vital signs reviewed and stable Respiratory status: spontaneous breathing, nonlabored ventilation, respiratory function stable and patient connected to nasal cannula oxygen Cardiovascular status: blood pressure returned to baseline and stable Postop Assessment: no apparent nausea or vomiting Anesthetic complications: no     Last Vitals:  Vitals:   05/15/19 0800 05/15/19 0815  BP: 108/75 114/75  Pulse: (!) 54 (!) 52  Resp: 14 14  Temp:    SpO2: 96% 94%    Last Pain:  Vitals:   05/15/19 0815  TempSrc:   PainSc: 0-No pain                 [name] [name]
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
## 2019-05-23 ENCOUNTER — Encounter: Payer: Self-pay | Admitting: Physician Assistant

## 2019-05-23 ENCOUNTER — Other Ambulatory Visit: Payer: Self-pay

## 2019-05-23 ENCOUNTER — Ambulatory Visit (INDEPENDENT_AMBULATORY_CARE_PROVIDER_SITE_OTHER): Payer: 59 | Admitting: Physician Assistant

## 2019-05-23 VITALS — BP 120/70 | HR 55 | Ht 72.0 in | Wt 365.0 lb

## 2019-05-23 DIAGNOSIS — I2510 Atherosclerotic heart disease of native coronary artery without angina pectoris: Secondary | ICD-10-CM

## 2019-05-23 DIAGNOSIS — I10 Essential (primary) hypertension: Secondary | ICD-10-CM

## 2019-05-23 DIAGNOSIS — E782 Mixed hyperlipidemia: Secondary | ICD-10-CM

## 2019-05-23 DIAGNOSIS — I2584 Coronary atherosclerosis due to calcified coronary lesion: Secondary | ICD-10-CM

## 2019-05-23 DIAGNOSIS — R5382 Chronic fatigue, unspecified: Secondary | ICD-10-CM

## 2019-05-23 DIAGNOSIS — I4819 Other persistent atrial fibrillation: Secondary | ICD-10-CM

## 2019-05-23 DIAGNOSIS — I519 Heart disease, unspecified: Secondary | ICD-10-CM | POA: Diagnosis not present

## 2019-05-23 MED ORDER — AMIODARONE HCL 200 MG PO TABS
200.0000 mg | ORAL_TABLET | Freq: Every day | ORAL | 6 refills | Status: DC
Start: 2019-05-23 — End: 2020-06-21

## 2019-05-23 NOTE — Patient Instructions (Signed)
Medication Instructions:  1- DECREASE Amiodarone Take 1 tablet (200 mg total) by mouth daily *If you need a refill on your cardiac medications before your next appointment, please call your pharmacy*   Lab Work: None ordered  If you have labs (blood work) drawn today and your tests are completely normal, you will receive your results only by: Marland Kitchen MyChart Message (if you have MyChart) OR . A paper copy in the mail If you have any lab test that is abnormal or we need to change your treatment, we will call you to review the results.   Testing/Procedures: 1- Echo  Please return to [facility] on ______________ at _______________ AM/PM for an Echocardiogram. Your physician has requested that you have an echocardiogram. Echocardiography is a painless test that uses sound waves to create images of your heart. It provides your doctor with information about the size and shape of your heart and how well your heart's chambers and valves are working. This procedure takes approximately one hour. There are no restrictions for this procedure. Please note; depending on visual quality an IV may need to be placed.     Follow-Up: At [facility], you and your health needs are our priority.  As part of our continuing mission to provide you with exceptional heart care, we have created designated Provider Care Teams.  These Care Teams include your primary Cardiologist (physician) and Advanced Practice Providers (APPs -  Physician Assistants and Nurse Practitioners) who all work together to provide you with the care you need, when you need it.  We recommend signing up for the patient portal called "MyChart".  Sign up information is provided on this After Visit Summary.  MyChart is used to connect with patients for Virtual Visits (Telemedicine).  Patients are able to view lab/test results, encounter notes, upcoming appointments, etc.  Non-urgent messages can be sent to your provider as well.   To learn  more about what you can do with MyChart, go to [email or web address].    Your next appointment:   2 month(s)  The format for your next appointment:   In Person  Provider:    You may see Ida Rogue, MD or Christell Faith, PA-C

## 2019-06-04 ENCOUNTER — Encounter: Payer: Self-pay | Admitting: Student in an Organized Health Care Education/Training Program

## 2019-06-04 ENCOUNTER — Telehealth: Payer: Self-pay

## 2019-06-04 NOTE — Telephone Encounter (Signed)
Patient returned call and lvmail.

## 2019-06-04 NOTE — Telephone Encounter (Signed)
Lm for patient to call office

## 2019-06-05 ENCOUNTER — Other Ambulatory Visit: Payer: Self-pay

## 2019-06-05 ENCOUNTER — Ambulatory Visit
Payer: 59 | Attending: Student in an Organized Health Care Education/Training Program | Admitting: Student in an Organized Health Care Education/Training Program

## 2019-06-05 DIAGNOSIS — M19031 Primary osteoarthritis, right wrist: Secondary | ICD-10-CM

## 2019-06-05 DIAGNOSIS — M65332 Trigger finger, left middle finger: Secondary | ICD-10-CM | POA: Diagnosis not present

## 2019-06-05 DIAGNOSIS — G8929 Other chronic pain: Secondary | ICD-10-CM

## 2019-06-05 DIAGNOSIS — M5416 Radiculopathy, lumbar region: Secondary | ICD-10-CM

## 2019-06-05 DIAGNOSIS — M19032 Primary osteoarthritis, left wrist: Secondary | ICD-10-CM

## 2019-06-05 DIAGNOSIS — M47816 Spondylosis without myelopathy or radiculopathy, lumbar region: Secondary | ICD-10-CM

## 2019-06-05 DIAGNOSIS — G894 Chronic pain syndrome: Secondary | ICD-10-CM

## 2019-06-05 MED ORDER — TRAMADOL HCL 50 MG PO TABS: 100.0000 mg | ORAL_TABLET | Freq: Two times a day (BID) | ORAL | 2 refills | Status: AC | PRN

## 2019-06-05 NOTE — Progress Notes (Signed)
Patient: [name]  Service Category: E/M  Provider: Gillis Santa, MD  DOB: November 07, 1955  DOS: 06/05/2019  Location: Office  MRN: [id number]  Setting: Ambulatory outpatient  Referring Provider: Guadalupe Maple, MD  Type: Established Patient  Specialty: Interventional Pain Management  PCP: Volney American, PA-C  Location: Home  Delivery: TeleHealth     Virtual Encounter - Pain Management PROVIDER NOTE: Information contained herein reflects review and annotations entered in association with encounter. Interpretation of such information and data should be left to medically-trained personnel. Information provided to patient can be located elsewhere in the medical record under "Patient Instructions". Document created using STT-dictation technology, any transcriptional errors that may result from process are unintentional.    Contact & Pharmacy Preferred: [phone] Home: [phone] (home) Mobile: [phone] (mobile) E-mail: [email or web address]  [facility] [phone] #[id number], [place] [address] AT [place] [address] [place] Phone: [phone] Fax: [phone]   Pre-screening  [name] offered "in-person" vs "virtual" encounter. He indicated preferring virtual for this encounter.   Reason COVID-19*  Social distancing based on CDC and AMA recommendations.   I contacted [phone] on 06/05/2019 via televisit. I clearly identified myself as 06/18/2019, MD. I verified that I was speaking with the correct person using two identifiers (Name: [name], and date of birth: May 11, 1966).  Consent I sought verbal advanced consent from 08/28/1966 for virtual visit interactions. I informed [name] of possible security and privacy concerns, risks, and limitations associated with providing "not-in-person" medical evaluation and management services. I also informed [name] of the availability of "in-person" appointments. Finally, I  informed him that there would be a charge for the virtual visit and that he could be  personally, fully or partially, financially responsible for it. [name] expressed understanding and agreed to proceed.   Historic Elements   [name] is a 64 y.o. year old, male patient evaluated today after his last contact with our practice on 06/04/2019. [name]  has a past medical history of Cardiomyopathy (in setting of Afib), DJD (degenerative joint disease) of knee, History of kidney stones, Intervertebral disc disorder with radiculopathy of lumbosacral region, Kidney stone (2012), Lower extremity edema, PAF (paroxysmal atrial fibrillation) ([place]), Pernicious anemia, Prostate cancer ([place]), Pulmonary nodule, right, and SVT (supraventricular tachycardia) ([place]). He also  has a past surgical history that includes Knee surgery; Gastric bypass (2002); prostate seeding; Carpal tunnel release (Right, 12/23/2014); Carpal tunnel release (Left, 01/06/2015); Colonoscopy with propofol (N/A, 10/08/2017); and Cardioversion (N/A, 05/15/2019). [name] has a current medication list which includes the following prescription(s): acetaminophen, amiodarone, amlodipine, benazepril, cyanocobalamin, cyclobenzaprine, ezetimibe, gabapentin, salonpas pain relief patch, metoprolol succinate, mirtazapine, multivitamin with minerals, olopatadine hcl, rivaroxaban, rosuvastatin, tramadol, and trazodone. He  reports that he quit smoking about 40 years ago. His smoking use included cigarettes. He has a 10.00 pack-year smoking history. He has quit using smokeless tobacco.  His smokeless tobacco use included snuff. He reports current alcohol use of about 12.0 standard drinks of alcohol per week. He reports that he does not use drugs. [name] has No Known Allergies.   HPI  Today, he is being contacted for medication management.  No change in medical history since last visit.  Patient's pain is at baseline. Having issues with trigger  finger. We did a trigger finger injection previously which did help (left middle trigger finger) and right wrist arthropathy.  Would like to avoid surgery.   Patient continues multimodal pain regimen as prescribed.  States that it provides pain relief and improvement in functional status.  Pharmacotherapy Assessment  Analgesic:  05/05/2019  1   03/11/2019  Tramadol Hcl 50 MG Tablet  120.00  30 Bi Lat   [id number]   Wal ([id number])   2  20.00 MME  Comm Ins   [place]   Monitoring: [place] PMP: PDMP reviewed during this encounter.       Pharmacotherapy: No side-effects or adverse reactions reported. Compliance: No problems identified. Effectiveness: Clinically acceptable. Plan: Refer to "POC".  UDS:  Summary  Date Value Ref Range Status  02/27/2019 Note  Final    Comment:    ==================================================================== Compliance Drug Analysis, Ur ==================================================================== Test                             Result       Flag       Units Drug Present and Declared for Prescription Verification   Tramadol                       >2463        EXPECTED   ng/mg creat   O-Desmethyltramadol            1297         EXPECTED   ng/mg creat   N-Desmethyltramadol            >2463        EXPECTED   ng/mg creat    Source of tramadol is a prescription medication. O-desmethyltramadol    and N-desmethyltramadol are expected metabolites of tramadol.   Gabapentin                     PRESENT      EXPECTED   Cyclobenzaprine                PRESENT      EXPECTED   Desmethylcyclobenzaprine       PRESENT      EXPECTED    Desmethylcyclobenzaprine is an expected metabolite of    cyclobenzaprine.   Mirtazapine                    PRESENT      EXPECTED   Trazodone                      PRESENT      EXPECTED   1,3 chlorophenyl piperazine    PRESENT      EXPECTED    1,3-chlorophenyl piperazine is an expected metabolite of trazodone.   Metoprolol                      PRESENT      EXPECTED Drug Present not Declared for Prescription Verification   Acetaminophen                  PRESENT      UNEXPECTED   Ibuprofen                      PRESENT      UNEXPECTED   Diphenhydramine                PRESENT      UNEXPECTED ==================================================================== Test  Result    Flag   Units      Ref Range   Creatinine              203              mg/dL      >=20 ==================================================================== Declared Medications:  The flagging and interpretation on this report are based on the  following declared medications.  Unexpected results may arise from  inaccuracies in the declared medications.  **Note: The testing scope of this panel includes these medications:  Cyclobenzaprine  Gabapentin  Metoprolol  Mirtazapine  Tramadol  Trazodone  **Note: The testing scope of this panel does not include the  following reported medications:  Alendronate  Amlodipine  Benazepril  Cyanocobalamin  Ezetimibe  Rivaroxaban  Rosuvastatin ==================================================================== For clinical consultation, please call [phone]. ====================================================================    Laboratory Chemistry Profile   Renal Lab Results  Component Value Date   BUN 18 05/05/2019   CREATININE 1.14 05/05/2019   BCR 12 08/13/2018   GFRAA >60 05/05/2019   GFRNONAA >60 05/05/2019     Hepatic Lab Results  Component Value Date   AST 19 07/25/2018   ALT 19 07/25/2018   ALBUMIN 4.6 07/25/2018   ALKPHOS 72 07/25/2018   LIPASE 28 03/13/2017     Electrolytes Lab Results  Component Value Date   NA 141 05/05/2019   K 4.3 05/05/2019   CL 106 05/05/2019   CALCIUM 8.8 (L) 05/05/2019   MG 2.0 05/05/2019     Bone No results found for: VD25OH, VD125OH2TOT, TK2409BD5, HG9924QA8, 25OHVITD1, 25OHVITD2, 25OHVITD3, TESTOFREE, TESTOSTERONE    Inflammation (CRP: Acute Phase) (ESR: Chronic Phase) No results found for: CRP, ESRSEDRATE, LATICACIDVEN     Note: Above Lab results reviewed.  Imaging  CT Head Wo Contrast CLINICAL DATA:  Vertigo, convulsions  EXAM: CT HEAD WITHOUT CONTRAST  TECHNIQUE: Contiguous axial images were obtained from the base of the skull through the vertex without intravenous contrast.  COMPARISON:  None.  FINDINGS: Brain: No evidence of acute infarction, hemorrhage, hydrocephalus, extra-axial collection or mass lesion/mass effect.  Vascular: No hyperdense vessel or unexpected calcification.  Skull: Normal. Negative for fracture or focal lesion.  Sinuses/Orbits: No acute finding.  Other: None.  IMPRESSION: No acute intracranial pathology.  Electronically Signed   By: Eddie Candle M.D.   On: 05/05/2019 19:47 DG Chest 2 View CLINICAL DATA:  64 year old male with history of chest pain and convulsions.  EXAM: CHEST - 2 VIEW  COMPARISON:  Chest x-ray 03/13/2017.  FINDINGS: Lung volumes are normal. No consolidative airspace disease. No pleural effusions. No pneumothorax. No pulmonary nodule or mass noted. Pulmonary vasculature and the cardiomediastinal silhouette are within normal limits.  IMPRESSION: No radiographic evidence of acute cardiopulmonary disease.  Electronically Signed   By: Vinnie Langton M.D.   On: 05/05/2019 14:56  Assessment  The primary encounter diagnosis was Trigger middle finger of left hand. Diagnoses of Primary osteoarthritis of both wrists, Chronic radicular lumbar pain, Morbid obesity ([place]), Lumbar facet arthropathy, and Chronic pain syndrome were also pertinent to this visit.  Plan of Care  Problem-specific:  [name] has a current medication list which includes the following long-term medication(s): amiodarone, amlodipine, benazepril, ezetimibe, gabapentin, metoprolol succinate, mirtazapine, rivaroxaban, rosuvastatin, and trazodone.  1.  Trigger middle finger of left hand - Injection tendon or ligament; Future  2. Primary osteoarthritis of both wrists - traMADol (ULTRAM) 50 MG tablet; Take 2 tablets (100 mg total) by mouth 2 (  two) times daily as needed for moderate pain or severe pain.  Dispense: 120 tablet; Refill: 2 - Medium Joint Injection/Arthrocentesis; Future  3. Chronic radicular lumbar pain - traMADol (ULTRAM) 50 MG tablet; Take 2 tablets (100 mg total) by mouth 2 (two) times daily as needed for moderate pain or severe pain.  Dispense: 120 tablet; Refill: 2  4. Morbid obesity ([place]) -exercise, healthy eating  5. Lumbar facet arthropathy -exercise discussed  6. Chronic pain syndrome - traMADol (ULTRAM) 50 MG tablet; Take 2 tablets (100 mg total) by mouth 2 (two) times daily as needed for moderate pain or severe pain.  Dispense: 120 tablet; Refill: 2  Pharmacotherapy (Medications Ordered): Meds ordered this encounter  Medications  . traMADol (ULTRAM) 50 MG tablet    Sig: Take 2 tablets (100 mg total) by mouth 2 (two) times daily as needed for moderate pain or severe pain.    Dispense:  120 tablet    Refill:  2   Orders:  Orders Placed This Encounter  Procedures  . Injection tendon or ligament    Standing Status:   Future    Standing Expiration Date:   07/05/2019    Scheduling Instructions:     Right middle trigger finger  . Medium Joint Injection/Arthrocentesis    Standing Status:   Future    Standing Expiration Date:   12/05/2020    Scheduling Instructions:     Right wrist steroid injection   Follow-up plan:   Return in about 1 week (around 06/12/2019) for Left middle Trigger finger, right wrist steroid .     s/p lesi #1 (left L4/5) on 8/17, #2 on 10/21/2018 (left L3/4), #3 on 12/09/2018 (Left L3/4); 02/24/19: s/p right wrist injection- dorsal scaphoid not helpful, left trigger finger (middle finger) injection at A1 pulley helpful, repeat A3 pully 03/24/2019     Recent Visits Date Type Provider Dept   03/24/19 Procedure visit Gillis Santa, MD [facility]  03/11/19 Office Visit Gillis Santa, MD [facility]  Showing recent visits within past 90 days and meeting all other requirements   Future Appointments No visits were found meeting these conditions.  Showing future appointments within next 90 days and meeting all other requirements   I discussed the assessment and treatment plan with the patient. The patient was provided an opportunity to ask questions and all were answered. The patient agreed with the plan and demonstrated an understanding of the instructions.  Patient advised to call back or seek an in-person evaluation if the symptoms or condition worsens.  Duration of encounter: 30 minutes.  Note by: Gillis Santa, MD Date: 06/05/2019; Time: 8:37 AM

## 2019-06-08 ENCOUNTER — Other Ambulatory Visit: Payer: Self-pay | Admitting: Family Medicine

## 2019-06-08 NOTE — Telephone Encounter (Signed)
Requested Prescriptions  Pending Prescriptions Disp Refills  . mirtazapine (REMERON) 15 MG tablet [Pharmacy Med Name: MIRTAZAPINE 15MG  TABLETS] 90 tablet 0    Sig: TAKE 1 TABLET(15 MG) BY MOUTH AT BEDTIME AS NEEDED FOR SLEEP     Psychiatry: Antidepressants - mirtazapine Passed - 06/08/2019  9:09 AM      Passed - AST in normal range and within 360 days    AST  Date Value Ref Range Status  07/25/2018 19 0 - 40 IU/L Final   AST (SGOT) Piccolo, Waived  Date Value Ref Range Status  12/04/2016 26 11 - 38 U/L Final         Passed - ALT in normal range and within 360 days    ALT  Date Value Ref Range Status  07/25/2018 19 0 - 44 IU/L Final   ALT (SGPT) Piccolo, Waived  Date Value Ref Range Status  12/04/2016 16 10 - 47 U/L Final         Passed - Triglycerides in normal range and within 360 days    Triglycerides  Date Value Ref Range Status  07/25/2018 89 0 - 149 mg/dL Final   Triglycerides Piccolo,Waived  Date Value Ref Range Status  12/04/2016 147 <150 mg/dL Final    Comment:                            Normal                   <150                         Borderline High     150 - 199                         High                200 - 499                         Very High                >499          Passed - Total Cholesterol in normal range and within 360 days    Cholesterol, Total  Date Value Ref Range Status  07/25/2018 154 100 - 199 mg/dL Final   Cholesterol Piccolo, Waived  Date Value Ref Range Status  12/04/2016 195 <200 mg/dL Final    Comment:                            Desirable                <200                         Borderline High      200- 239                         High                     >239          Passed - WBC in normal range and within 360 days    WBC  Date Value Ref Range Status  05/05/2019 6.7 4.0 -  10.5 K/uL Final         Passed - Valid encounter within last 6 months    Recent Outpatient Visits          4 months ago Essential  hypertension   [facility] Volney American, [place]   10 months ago Annual physical exam   [facility] Volney American, [place]   10 months ago Viral upper respiratory tract infection   [facility] [place], Jeannette How, [place]   11 months ago Intervertebral disc disorder with radiculopathy of lumbosacral region   [facility], Jeannette How, MD   1 year ago Anemia, unspecified type   [place], [place]      Future Appointments            In 1 month Gollan, Kathlene November, MD [facility], [place]   In 1 month [place], Lilia Argue, PA-C [place], [place]           . cyclobenzaprine ([place]) 10 MG tablet [Pharmacy Med Name: CYCLOBENZAPRINE 10MG  TABLETS] 60 tablet 1    Sig: TAKE 1 TABLET(10 MG) BY MOUTH THREE TIMES DAILY AS NEEDED FOR MUSCLE SPASMS     Not Delegated - Analgesics:  Muscle Relaxants Failed - 06/08/2019  9:09 AM      Failed - This refill cannot be delegated      Passed - Valid encounter within last 6 months    Recent Outpatient Visits          4 months ago Essential hypertension   [facility] Volney American, [place]   10 months ago Annual physical exam   [facility] Volney American, [place]   10 months ago Viral upper respiratory tract infection   [facility] [place], Jeannette How, MD   11 months ago Intervertebral disc disorder with radiculopathy of lumbosacral region   [place], Jeannette How, MD   1 year ago Anemia, unspecified type   [place], MD      Future Appointments            In 1 month [place], Kathlene November, MD [facility], [place]   In 1 month [place], Lilia Argue, [place], [place]

## 2019-06-08 NOTE — Telephone Encounter (Signed)
Requested medication (s) are due for refill today: yes  Requested medication (s) are on the active medication list: yes  Last refill:  01/06/19  Future visit scheduled: yes  Notes to clinic:  medication not delegated to NT to RF   Requested Prescriptions  Pending Prescriptions Disp Refills   cyclobenzaprine (FLEXERIL) 10 MG tablet [Pharmacy Med Name: CYCLOBENZAPRINE 10MG  TABLETS] 60 tablet 1    Sig: TAKE 1 TABLET(10 MG) BY MOUTH THREE TIMES DAILY AS NEEDED FOR MUSCLE SPASMS      Not Delegated - Analgesics:  Muscle Relaxants Failed - 06/08/2019  9:09 AM      Failed - This refill cannot be delegated      Passed - Valid encounter within last 6 months    Recent Outpatient Visits           4 months ago Essential hypertension   [facility] Volney American, [place]   10 months ago Annual physical exam   [facility] Volney American, [place]   10 months ago Viral upper respiratory tract infection   [facility] [place], Jeannette How, [place]   11 months ago Intervertebral disc disorder with radiculopathy of lumbosacral region   [facility] [place], Jeannette How, MD   1 year ago Anemia, unspecified type   [place], MD       Future Appointments             In 1 month Gollan, Kathlene November, MD [facility], [facility]   In 1 month [place], Lilia Argue, PA-C [employer], [facility]             Signed Prescriptions Disp Refills   mirtazapine (REMERON) 15 MG tablet 90 tablet 0    Sig: TAKE 1 TABLET(15 MG) BY MOUTH AT BEDTIME AS NEEDED FOR SLEEP      Psychiatry: Antidepressants - mirtazapine Passed - 06/08/2019  9:09 AM      Passed - AST in normal range and within 360 days    AST  Date Value Ref Range Status  07/25/2018 19 0 - 40 IU/L Final   AST (SGOT) Piccolo, Waived  Date Value Ref Range Status  12/04/2016 26 11 - 38 U/L Final          Passed - ALT in normal range and  within 360 days    ALT  Date Value Ref Range Status  07/25/2018 19 0 - 44 IU/L Final   ALT (SGPT) Piccolo, Waived  Date Value Ref Range Status  12/04/2016 16 10 - 47 U/L Final          Passed - Triglycerides in normal range and within 360 days    Triglycerides  Date Value Ref Range Status  07/25/2018 89 0 - 149 mg/dL Final   Triglycerides Piccolo,Waived  Date Value Ref Range Status  12/04/2016 147 <150 mg/dL Final    Comment:                            Normal                   <150                         Borderline High     150 - 199  High                200 - 499                         Very High                >499           Passed - Total Cholesterol in normal range and within 360 days    Cholesterol, Total  Date Value Ref Range Status  07/25/2018 154 100 - 199 mg/dL Final   Cholesterol Piccolo, Waived  Date Value Ref Range Status  12/04/2016 195 <200 mg/dL Final    Comment:                            Desirable                <200                         Borderline High      200- 239                         High                     >239           Passed - WBC in normal range and within 360 days    WBC  Date Value Ref Range Status  05/05/2019 6.7 4.0 - 10.5 K/uL Final          Passed - Valid encounter within last 6 months    Recent Outpatient Visits           4 months ago Essential hypertension   [facility] Volney American, [place]   10 months ago Annual physical exam   [facility] Volney American, [place]   10 months ago Viral upper respiratory tract infection   [facility] [place], Jeannette How, [place]   11 months ago Intervertebral disc disorder with radiculopathy of lumbosacral region   [place], Jeannette How, MD   1 year ago Anemia, unspecified type   [place], MD       Future Appointments             In 1 month Gollan,  Kathlene November, MD [facility], [place]   In 1 month [place], Lilia Argue, [facility] [place], [place]

## 2019-06-09 NOTE — Telephone Encounter (Signed)
Routing to provider  

## 2019-06-10 ENCOUNTER — Ambulatory Visit: Payer: 59 | Admitting: Cardiovascular Disease

## 2019-06-25 ENCOUNTER — Other Ambulatory Visit: Payer: Self-pay

## 2019-06-25 ENCOUNTER — Ambulatory Visit
Payer: 59 | Attending: Student in an Organized Health Care Education/Training Program | Admitting: Student in an Organized Health Care Education/Training Program

## 2019-06-25 ENCOUNTER — Ambulatory Visit: Admission: RE | Admit: 2019-06-25 | Payer: 59 | Source: Ambulatory Visit

## 2019-06-25 ENCOUNTER — Encounter: Payer: Self-pay | Admitting: Student in an Organized Health Care Education/Training Program

## 2019-06-25 DIAGNOSIS — M65332 Trigger finger, left middle finger: Secondary | ICD-10-CM

## 2019-06-25 DIAGNOSIS — Z9884 Bariatric surgery status: Secondary | ICD-10-CM | POA: Insufficient documentation

## 2019-06-25 DIAGNOSIS — Z79899 Other long term (current) drug therapy: Secondary | ICD-10-CM | POA: Insufficient documentation

## 2019-06-25 DIAGNOSIS — M25531 Pain in right wrist: Secondary | ICD-10-CM | POA: Insufficient documentation

## 2019-06-25 DIAGNOSIS — M545 Low back pain: Secondary | ICD-10-CM | POA: Insufficient documentation

## 2019-06-25 MED ORDER — DEXAMETHASONE SODIUM PHOSPHATE 10 MG/ML IJ SOLN
10.0000 mg | Freq: Once | INTRAMUSCULAR | Status: AC
  Administered 2019-06-25: 10 mg
  Filled 2019-06-25: qty 1

## 2019-06-25 MED ORDER — LIDOCAINE HCL 2 % IJ SOLN
20.0000 mL | Freq: Once | INTRAMUSCULAR | Status: AC
  Administered 2019-06-25: 400 mg
  Filled 2019-06-25: qty 20

## 2019-06-25 NOTE — Progress Notes (Signed)
PROVIDER NOTE: Information contained herein reflects review and annotations entered in association with encounter. Interpretation of such information and data should be left to medically-trained personnel. Information provided to patient can be located elsewhere in the medical record under "Patient Instructions". Document created using STT-dictation technology, any transcriptional errors that may result from process are unintentional.    Patient: [name]  Service Category: Procedure  Provider: Gillis Santa, MD  DOB: 1955-10-09  DOS: 06/25/2019  Location: [facility]  MRN: [id number]  Setting: Ambulatory - outpatient  Referring Provider: Gillis Santa, MD  Type: Established Patient  Specialty: Interventional Pain Management  PCP: Volney American, PA-C   Primary Reason for Visit: Interventional Pain Management Treatment. CC: Hand Pain (left middle finger), Wrist Pain (right), and Back Pain (low)  Procedure:          Anesthesia, Analgesia, Anxiolysis:  Type: Trigger Finger Ligament/Tendon sheath (20550) Injection.  #2  Purpose: Therapeutic Target Area: Flexor Digitorum Tendon sheath nodule Region: A-2 pulley of the metacarpal area Approach: Percutaneous Digit: No:3(Middle) Finger Laterality: Left-hand  Type: Local Anesthesia Indication(s): Analgesia         Local Anesthetic: Lidocaine 1-2% Route: Infiltration ([place]/IM) IV Access: Declined Sedation: Declined   Position: Sitting   Indications: 1. Trigger middle finger of left hand    Pain Score: Pre-procedure: 4 /10 Post-procedure: 4 /10   Pre-op Assessment:  [name] is a 64 y.o. (year old), male patient, seen today for interventional treatment. He  has a past surgical history that includes Knee surgery; Gastric bypass (2002); prostate seeding; Carpal tunnel release (Right, 12/23/2014); Carpal tunnel release (Left, 01/06/2015); Colonoscopy with propofol (N/A, 10/08/2017); and Cardioversion (N/A, 05/15/2019). Mr.  Hill has a current medication list which includes the following prescription(s): acetaminophen, amiodarone, amlodipine, benazepril, cyanocobalamin, cyclobenzaprine, ezetimibe, gabapentin, salonpas pain relief patch, metoprolol succinate, mirtazapine, multivitamin with minerals, olopatadine hcl, rivaroxaban, rosuvastatin, tramadol, and trazodone. His primarily concern today is the Hand Pain (left middle finger), Wrist Pain (right), and Back Pain (low)  Initial Vital Signs:  Pulse/HCG Rate: 67ECG Heart Rate: 65 Temp: (!) 97 F (36.1 C) Resp: 18 BP: (!) 144/85 SpO2: 96 %  BMI: Estimated body mass index is 49.23 kg/m as calculated from the following:   Height as of this encounter: 6' (1.829 m).   Weight as of this encounter: 363 lb (164.7 kg).  Risk Assessment: Allergies: Reviewed. He has No Known Allergies.  Allergy Precautions: None required Coagulopathies: Reviewed. None identified.  Blood-thinner therapy: None at this time Active Infection(s): Reviewed. None identified. [name] is afebrile  Site Confirmation: [name] was asked to confirm the procedure and laterality before marking the site Procedure checklist: Completed Consent: Before the procedure and under the influence of no sedative(s), amnesic(s), or anxiolytics, the patient was informed of the treatment options, risks and possible complications. To fulfill our ethical and legal obligations, as recommended by the American Medical Association's Code of Ethics, I have informed the patient of my clinical impression; the nature and purpose of the treatment or procedure; the risks, benefits, and possible complications of the intervention; the alternatives, including doing nothing; the risk(s) and benefit(s) of the alternative treatment(s) or procedure(s); and the risk(s) and benefit(s) of doing nothing. The patient was provided information about the general risks and possible complications associated with the procedure. These may  include, but are not limited to: failure to achieve desired goals, infection, bleeding, organ or nerve damage, allergic reactions, paralysis, and death. In addition, the patient was  informed of those risks and complications associated to the procedure, such as failure to decrease pain; infection; bleeding; organ or nerve damage with subsequent damage to sensory, motor, and/or autonomic systems, resulting in permanent pain, numbness, and/or weakness of one or several areas of the body; allergic reactions; (i.e.: anaphylactic reaction); and/or death. Furthermore, the patient was informed of those risks and complications associated with the medications. These include, but are not limited to: allergic reactions (i.e.: anaphylactic or anaphylactoid reaction(s)); adrenal axis suppression; blood sugar elevation that in diabetics may result in ketoacidosis or comma; water retention that in patients with history of congestive heart failure may result in shortness of breath, pulmonary edema, and decompensation with resultant heart failure; weight gain; swelling or edema; medication-induced neural toxicity; particulate matter embolism and blood vessel occlusion with resultant organ, and/or nervous system infarction; and/or aseptic necrosis of one or more joints. Finally, the patient was informed that Medicine is not an exact science; therefore, there is also the possibility of unforeseen or unpredictable risks and/or possible complications that may result in a catastrophic outcome. The patient indicated having understood very clearly. We have given the patient no guarantees and we have made no promises. Enough time was given to the patient to ask questions, all of which were answered to the patient's satisfaction. [name] has indicated that he wanted to continue with the procedure. Attestation: I, the ordering provider, attest that I have discussed with the patient the benefits, risks, side-effects, alternatives,  likelihood of achieving goals, and potential problems during recovery for the procedure that I have provided informed consent. Date  Time: 06/25/2019  8:43 AM  Pre-Procedure Preparation:  Monitoring: As per clinic protocol. Respiration, ETCO2, SpO2, BP, heart rate and rhythm monitor placed and checked for adequate function Safety Precautions: Patient was assessed for positional comfort and pressure points before starting the procedure. Time-out: I initiated and conducted the "Time-out" before starting the procedure, as per protocol. The patient was asked to participate by confirming the accuracy of the "Time Out" information. Verification of the correct person, site, and procedure were performed and confirmed by me, the nursing staff, and the patient. "Time-out" conducted as per Joint Commission's Universal Protocol (UP.01.01.01). Time: 0917  Description of Procedure:          Area Prepped: Entire palmar and dorsal aspect of hand, up to forearm area. Prepping solution: DuraPrep (Iodine Povacrylex [0.7% available iodine] and Isopropyl Alcohol, 74% w/w) Safety Precautions: Aspiration looking for blood return was conducted prior to all injections. At no point did we inject any substances, as a needle was being advanced. No attempts were made at seeking any paresthesias. Safe injection practices and needle disposal techniques used. Medications properly checked for expiration dates. SDV (single dose vial) medications used. Description of the Procedure: Protocol guidelines were followed. The patient was placed in position. The target area was identified and prepped in the usual manner. Skin & deeper tissues infiltrated with local anesthetic. Appropriate time provided for local anesthetics to take effect. The procedure needle was slowly advanced to target area. Proper needle placement secured. Negative aspiration confirmed. Solution injected in intermittent fashion, asking for systemic symptoms every 0.5cc.  Needle(s) removed and area cleaned, making sure to leave some prepping solution back to take advantage of its long term bactericidal properties.  Vitals:   06/25/19 0849 06/25/19 0923  BP: (!) 144/85 140/83  Pulse: 67   Resp: 18 18  Temp: (!) 97 F (36.1 C)   SpO2: 96%   Weight: Marland Kitchen)  363 lb (164.7 kg)   Height: 6' (1.829 m)     Start Time: 0918 hrs. End Time: 0920 hrs. Materials: 30-gauge 0.5 inch needle on TB syringe with 0.5 cc of 0.2% ropivacaine along with 0.5 cc of Decadron 10 mg/cc.  Injected into the A2 ligament.  Medication(s): Please see orders for medications and dosing details.    Imaging Guidance:          Type of Imaging Technique: None used Indication(s): N/A Exposure Time: No patient exposure Contrast: None used. Fluoroscopic Guidance: N/A Ultrasound Guidance: N/A Interpretation: N/A  Post-operative Assessment:  Post-procedure Vital Signs:  Pulse/HCG Rate: 6765 Temp: (!) 97 F (36.1 C) Resp: 18 BP: 140/83 SpO2: 96 %  EBL: None  Complications: No immediate post-treatment complications observed by team, or reported by patient.  Note: The patient tolerated the entire procedure well. A repeat set of vitals were taken after the procedure and the patient was kept under observation following institutional policy, for this type of procedure. Post-procedural neurological assessment was performed, showing return to baseline, prior to discharge. The patient was provided with post-procedure discharge instructions, including a section on how to identify potential problems. Should any problems arise concerning this procedure, the patient was given instructions to immediately contact us, at any time, without hesitation. In any case, we plan to contact the patient by telephone for a follow-up status report regarding this interventional procedure.  Comments:  No additional relevant information.  Plan of Care  Orders:  No orders of the defined types were placed in this  encounter.   Medications ordered for procedure: Meds ordered this encounter  Medications  . lidocaine (XYLOCAINE) 2 % (with pres) injection 400 mg  . dexamethasone (DECADRON) injection 10 mg   Medications administered: We administered lidocaine and dexamethasone.  See the medical record for exact dosing, route, and time of administration.  Follow-up plan:   Return if symptoms worsen or fail to improve.      s/p lesi #1 (left L4/5) on 8/17, #2 on 10/21/2018 (left L3/4), #3 on 12/09/2018 (Left L3/4); 02/24/19: s/p right wrist injection- dorsal scaphoid not helpful, left trigger finger (middle finger) injection at A1 pulley helpful, repeat A3 pully 03/24/2019, 06/25/19    Recent Visits No visits were found meeting these conditions.  Showing recent visits within past 90 days and meeting all other requirements   Today's Visits Date Type Provider Dept  06/25/19 Procedure visit Gillis Santa, MD [facility]  Showing today's visits and meeting all other requirements   Future Appointments No visits were found meeting these conditions.  Showing future appointments within next 90 days and meeting all other requirements   Disposition: Discharge home  Discharge (Date  Time): 06/25/2019; 0924 hrs.   Primary Care Physician: Volney American, PA-C Location: [facility] Note by: Gillis Santa, MD Date: 06/25/2019; Time: 9:42 AM  Disclaimer:  Medicine is not an exact science. The only guarantee in medicine is that nothing is guaranteed. It is important to note that the decision to proceed with this intervention was based on the information collected from the patient. The Data and conclusions were drawn from the patient's questionnaire, the interview, and the physical examination. Because the information was provided in large part by the patient, it cannot be guaranteed that it has not been purposely or unconsciously manipulated. Every effort has been made to  obtain as much relevant data as possible for this evaluation. It is important to note that the conclusions that  lead to this procedure are derived in large part from the available data. Always take into account that the treatment will also be dependent on availability of resources and existing treatment guidelines, considered by other Pain Management Practitioners as being common knowledge and practice, at the time of the intervention. For Medico-Legal purposes, it is also important to point out that variation in procedural techniques and pharmacological choices are the acceptable norm. The indications, contraindications, technique, and results of the above procedure should only be interpreted and judged by a Board-Certified Interventional Pain Specialist with extensive familiarity and expertise in the same exact procedure and technique.

## 2019-06-25 NOTE — Progress Notes (Signed)
Safety precautions to be maintained throughout the outpatient stay will include: orient to surroundings, keep bed in low position, maintain call bell within reach at all times, provide assistance with transfer out of bed and ambulation.  

## 2019-06-26 ENCOUNTER — Telehealth: Payer: Self-pay | Admitting: *Deleted

## 2019-06-26 NOTE — Telephone Encounter (Signed)
No problems post procedure. 

## 2019-06-30 ENCOUNTER — Other Ambulatory Visit: Payer: Self-pay | Admitting: Family Medicine

## 2019-06-30 NOTE — Telephone Encounter (Signed)
Medication Refill - Medication: traZODone (DESYREL) 100 MG tablet  Pt states that his pharmacy has submitted 3 fax requests since last week. Has been out of his medication for over a week please advise   Has the patient contacted their pharmacy? Yes.   (Agent: If no, request that the patient contact the pharmacy for the refill.) (Agent: If yes, when and what did the pharmacy advise?)  Preferred Pharmacy (with phone number or street name):  [facility] [place], [place] - [address] AT [address]  [place] [address]  Phone: [phone] Fax: [phone]     Agent: Please be advised that RX refills may take up to 3 business days. We ask that you follow-up with your pharmacy.

## 2019-06-30 NOTE — Telephone Encounter (Signed)
Requested medication (s) are due for refill today: yes  Requested medication (s) are on the active medication list: {yes  Last refill:  07/09/2018  #90  3 refills  Future visit scheduled  yes 08/01/2019  Notes to clinic:not delegated..    Pt states that his pharmacy has submitted 3 fax requests since last week. Has been out of his medication for over a week please advise   Requested Prescriptions  Pending Prescriptions Disp Refills   traZODone (DESYREL) 100 MG tablet 90 tablet 3    Sig: Take 1 tablet (100 mg total) by mouth at bedtime.      Psychiatry: Antidepressants - Serotonin Modulator Passed - 06/30/2019  6:12 PM      Passed - Valid encounter within last 6 months    Recent Outpatient Visits           5 months ago Essential hypertension   [facility] Volney American, [place]   11 months ago Annual physical exam   [facility] Volney American, [place]   11 months ago Viral upper respiratory tract infection   [facility] [place], Jeannette How, [place]   11 months ago Intervertebral disc disorder with radiculopathy of lumbosacral region   [facility], Jeannette How, [place]   1 year ago Anemia, unspecified type   [place], [place]       Future Appointments             In 4 weeks Gollan, Kathlene November, MD [facility], [place]   In 1 month [place], Lilia Argue, [place], [place]

## 2019-07-01 MED ORDER — TRAZODONE HCL 100 MG PO TABS: 100.0000 mg | ORAL_TABLET | Freq: Every day | ORAL | 1 refills | Status: DC

## 2019-07-01 NOTE — Telephone Encounter (Signed)
Last seen 01/13/20, next appointment 08/01/19

## 2019-07-03 ENCOUNTER — Telehealth: Payer: Self-pay | Admitting: Student in an Organized Health Care Education/Training Program

## 2019-07-03 DIAGNOSIS — M5416 Radiculopathy, lumbar region: Secondary | ICD-10-CM

## 2019-07-03 DIAGNOSIS — G8929 Other chronic pain: Secondary | ICD-10-CM

## 2019-07-03 NOTE — Telephone Encounter (Deleted)
[name] this will need PA

## 2019-07-03 NOTE — Telephone Encounter (Signed)
PATIENT is having pain in lower back down into leg and also in his wrist. He would like to come in and have procedure on both. What procedure for back should we get PA for and schedule?

## 2019-07-03 NOTE — Telephone Encounter (Signed)
Spoke with patient, he is having a flare of his lower back going down the legs.  This had been much better for approx the last 6 months after having LESI.  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  States it is not bothering him much today but does not want to take a chance of it getting as severe as it has been int he past.    Patient just had trigger finger injection on 06/25/19 and reports great relief from that.    I told patient that I would send message to Dr Holley Raring for possible order for LESI unless he feels that he would benefit from something different.

## 2019-07-03 NOTE — Telephone Encounter (Signed)
Left voicemail with patient that Dr Holley Raring has ordered a LESI.  He will need to stop Xarelto 3 days prior to appt time.

## 2019-07-04 ENCOUNTER — Ambulatory Visit (INDEPENDENT_AMBULATORY_CARE_PROVIDER_SITE_OTHER): Payer: 59

## 2019-07-04 ENCOUNTER — Other Ambulatory Visit: Payer: Self-pay

## 2019-07-04 DIAGNOSIS — I519 Heart disease, unspecified: Secondary | ICD-10-CM | POA: Diagnosis not present

## 2019-07-04 DIAGNOSIS — I2510 Atherosclerotic heart disease of native coronary artery without angina pectoris: Secondary | ICD-10-CM | POA: Diagnosis not present

## 2019-07-07 ENCOUNTER — Telehealth: Payer: Self-pay

## 2019-07-07 NOTE — Telephone Encounter (Signed)
Call to patient to discuss echo results.   Pt verbalized understanding and reported he does not believe he stores. He took a sleep study a few years back and did not have sleep apnea.   Pt would like to discuss POC further with Dr. Rockey Situ at next [place] 7/12.   Advised pt to call for any further questions or concerns.

## 2019-07-07 NOTE — Telephone Encounter (Signed)
-----   Message from Rise Mu, PA-C sent at 07/06/2019 10:04 AM EDT ----- Echo showed improved, low normal pump function with slightly stiffened heart, moderately elevated pressure in the right side of the heart, and a mildly leaky tricuspid valve.   A common issue leading to elevated pressure in the right side of the heart is sleep apnea. Recommend repeat sleep study if he is agreeable. If so, please refer to Dr. Radford Pax.
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
## 2019-07-28 ENCOUNTER — Other Ambulatory Visit: Payer: Self-pay

## 2019-07-28 ENCOUNTER — Ambulatory Visit (HOSPITAL_BASED_OUTPATIENT_CLINIC_OR_DEPARTMENT_OTHER): Payer: 59 | Admitting: Student in an Organized Health Care Education/Training Program

## 2019-07-28 ENCOUNTER — Ambulatory Visit
Admission: RE | Admit: 2019-07-28 | Discharge: 2019-07-28 | Disposition: A | Discharge status: Home / Self Care | Payer: 59 | Source: Ambulatory Visit | Attending: Student in an Organized Health Care Education/Training Program | Admitting: Student in an Organized Health Care Education/Training Program

## 2019-07-28 ENCOUNTER — Ambulatory Visit (INDEPENDENT_AMBULATORY_CARE_PROVIDER_SITE_OTHER): Payer: 59 | Admitting: Cardiovascular Disease

## 2019-07-28 ENCOUNTER — Encounter: Payer: Self-pay | Admitting: Student in an Organized Health Care Education/Training Program

## 2019-07-28 ENCOUNTER — Encounter: Payer: Self-pay | Admitting: Cardiovascular Disease

## 2019-07-28 VITALS — BP 132/88 | HR 72 | Temp 98.1°F | Resp 16 | Ht 72.0 in | Wt 360.0 lb

## 2019-07-28 VITALS — BP 152/82 | HR 66 | Ht 72.0 in | Wt 363.0 lb

## 2019-07-28 DIAGNOSIS — M19032 Primary osteoarthritis, left wrist: Secondary | ICD-10-CM | POA: Insufficient documentation

## 2019-07-28 DIAGNOSIS — E782 Mixed hyperlipidemia: Secondary | ICD-10-CM

## 2019-07-28 DIAGNOSIS — G8929 Other chronic pain: Secondary | ICD-10-CM

## 2019-07-28 DIAGNOSIS — M5416 Radiculopathy, lumbar region: Secondary | ICD-10-CM | POA: Diagnosis not present

## 2019-07-28 DIAGNOSIS — I4819 Other persistent atrial fibrillation: Secondary | ICD-10-CM

## 2019-07-28 DIAGNOSIS — I10 Essential (primary) hypertension: Secondary | ICD-10-CM | POA: Diagnosis not present

## 2019-07-28 DIAGNOSIS — I2510 Atherosclerotic heart disease of native coronary artery without angina pectoris: Secondary | ICD-10-CM

## 2019-07-28 DIAGNOSIS — M19031 Primary osteoarthritis, right wrist: Secondary | ICD-10-CM | POA: Diagnosis not present

## 2019-07-28 DIAGNOSIS — I519 Heart disease, unspecified: Secondary | ICD-10-CM | POA: Diagnosis not present

## 2019-07-28 DIAGNOSIS — Z9884 Bariatric surgery status: Secondary | ICD-10-CM | POA: Insufficient documentation

## 2019-07-28 DIAGNOSIS — I2584 Coronary atherosclerosis due to calcified coronary lesion: Secondary | ICD-10-CM

## 2019-07-28 MED ORDER — ROPIVACAINE HCL 2 MG/ML IJ SOLN
INTRAMUSCULAR | Status: AC
  Filled 2019-07-28: qty 10

## 2019-07-28 MED ORDER — IOHEXOL 180 MG/ML  SOLN
10.0000 mL | Freq: Once | INTRAMUSCULAR | Status: AC
  Administered 2019-07-28: 10 mL via EPIDURAL

## 2019-07-28 MED ORDER — DEXAMETHASONE SODIUM PHOSPHATE 10 MG/ML IJ SOLN
INTRAMUSCULAR | Status: AC
  Filled 2019-07-28: qty 1

## 2019-07-28 MED ORDER — IOHEXOL 180 MG/ML  SOLN
INTRAMUSCULAR | Status: AC
  Filled 2019-07-28: qty 20

## 2019-07-28 MED ORDER — FUROSEMIDE 20 MG PO TABS
20.0000 mg | ORAL_TABLET | Freq: Every day | ORAL | 3 refills | Status: DC | PRN
Start: 2019-07-28 — End: 2020-06-21

## 2019-07-28 MED ORDER — LIDOCAINE HCL 2 % IJ SOLN
20.0000 mL | Freq: Once | INTRAMUSCULAR | Status: AC
  Administered 2019-07-28: 400 mg

## 2019-07-28 MED ORDER — SODIUM CHLORIDE 0.9% FLUSH
2.0000 mL | Freq: Once | INTRAVENOUS | Status: AC
  Administered 2019-07-28: 2 mL

## 2019-07-28 MED ORDER — ROPIVACAINE HCL 2 MG/ML IJ SOLN
2.0000 mL | Freq: Once | INTRAMUSCULAR | Status: AC
  Administered 2019-07-28: 2 mL via EPIDURAL

## 2019-07-28 MED ORDER — DEXAMETHASONE SODIUM PHOSPHATE 10 MG/ML IJ SOLN
10.0000 mg | Freq: Once | INTRAMUSCULAR | Status: AC
  Administered 2019-07-28: 10 mg

## 2019-07-28 MED ORDER — SODIUM CHLORIDE (PF) 0.9 % IJ SOLN
INTRAMUSCULAR | Status: AC
  Filled 2019-07-28: qty 10

## 2019-07-28 MED ORDER — LIDOCAINE HCL 2 % IJ SOLN
INTRAMUSCULAR | Status: AC
  Filled 2019-07-28: qty 10

## 2019-07-28 NOTE — Progress Notes (Signed)
PROVIDER NOTE: Information contained herein reflects review and annotations entered in association with encounter. Interpretation of such information and data should be left to medically-trained personnel. Information provided to patient can be located elsewhere in the medical record under "Patient Instructions". Document created using STT-dictation technology, any transcriptional errors that may result from process are unintentional.    Patient: [name]  Service Category: Procedure  Provider: Gillis Santa, MD  DOB: December 11, 1955  DOS: 07/28/2019  Location: [facility]  MRN: [id number]  Setting: Ambulatory - outpatient  Referring Provider: Gillis Santa, MD  Type: Established Patient  Specialty: Interventional Pain Management  PCP: Volney American, PA-C   Primary Reason for Visit: Interventional Pain Management Treatment. CC: Back Pain  Procedure:          Anesthesia, Analgesia, Anxiolysis:  Type: Therapeutic Wrist Steroid Injection  #2  Region: Dorsal Scaphoid Level: Wrist Laterality: Right-Sided  Type: Local Anesthesia Indication(s): Analgesia         Local Anesthetic: Lidocaine 1-2% Route: Infiltration (Cassia/IM) IV Access: Declined Sedation: Declined   Position: Sitting   Indications: 1. Primary osteoarthritis of both wrists   2. Chronic radicular lumbar pain   3. Lumbar radiculopathy    Here for right wrist injection (OA of wrist) and lumbar epidural steroid injection (last ESI 12/09/18)  Pain Score: Pre-procedure: 5 /10 Post-procedure: 0-No pain (back = 0  wrist = 2 )/10   Pre-op Assessment:  [name] is a 64 y.o. (year old), male patient, seen today for interventional treatment. He  has a past surgical history that includes Knee surgery; Gastric bypass (2002); prostate seeding; Carpal tunnel release (Right, 12/23/2014); Carpal tunnel release (Left, 01/06/2015); Colonoscopy with propofol (N/A, 10/08/2017); and Cardioversion (N/A, 05/15/2019). [name]  has a current medication list which includes the following prescription(s): acetaminophen, amiodarone, amlodipine, benazepril, cyanocobalamin, cyclobenzaprine, ezetimibe, gabapentin, salonpas pain relief patch, mirtazapine, multivitamin with minerals, olopatadine hcl, rivaroxaban, rosuvastatin, tramadol, trazodone, and metoprolol succinate. His primarily concern today is the Back Pain  Initial Vital Signs:  Pulse/HCG Rate: 72ECG Heart Rate: 68 Temp: 98.1 F (36.7 C) Resp: 18 BP: (!) 137/93 SpO2: 96 %  BMI: Estimated body mass index is 48.82 kg/m as calculated from the following:   Height as of this encounter: 6' (1.829 m).   Weight as of this encounter: 360 lb (163.3 kg).  Risk Assessment: Allergies: Reviewed. He has No Known Allergies.  Allergy Precautions: None required Coagulopathies: Reviewed. None identified.  Blood-thinner therapy: None at this time Active Infection(s): Reviewed. None identified. [name] is afebrile  Site Confirmation: [name] was asked to confirm the procedure and laterality before marking the site Procedure checklist: Completed Consent: Before the procedure and under the influence of no sedative(s), amnesic(s), or anxiolytics, the patient was informed of the treatment options, risks and possible complications. To fulfill our ethical and legal obligations, as recommended by the American Medical Association's Code of Ethics, I have informed the patient of my clinical impression; the nature and purpose of the treatment or procedure; the risks, benefits, and possible complications of the intervention; the alternatives, including doing nothing; the risk(s) and benefit(s) of the alternative treatment(s) or procedure(s); and the risk(s) and benefit(s) of doing nothing. The patient was provided information about the general risks and possible complications associated with the procedure. These may include, but are not limited to: failure to achieve desired goals,  infection, bleeding, organ or nerve damage, allergic reactions, paralysis, and death. In addition, the patient was informed of those  risks and complications associated to the procedure, such as failure to decrease pain; infection; bleeding; organ or nerve damage with subsequent damage to sensory, motor, and/or autonomic systems, resulting in permanent pain, numbness, and/or weakness of one or several areas of the body; allergic reactions; (i.e.: anaphylactic reaction); and/or death. Furthermore, the patient was informed of those risks and complications associated with the medications. These include, but are not limited to: allergic reactions (i.e.: anaphylactic or anaphylactoid reaction(s)); adrenal axis suppression; blood sugar elevation that in diabetics may result in ketoacidosis or comma; water retention that in patients with history of congestive heart failure may result in shortness of breath, pulmonary edema, and decompensation with resultant heart failure; weight gain; swelling or edema; medication-induced neural toxicity; particulate matter embolism and blood vessel occlusion with resultant organ, and/or nervous system infarction; and/or aseptic necrosis of one or more joints. Finally, the patient was informed that Medicine is not an exact science; therefore, there is also the possibility of unforeseen or unpredictable risks and/or possible complications that may result in a catastrophic outcome. The patient indicated having understood very clearly. We have given the patient no guarantees and we have made no promises. Enough time was given to the patient to ask questions, all of which were answered to the patient's satisfaction. [name] has indicated that he wanted to continue with the procedure. Attestation: I, the ordering provider, attest that I have discussed with the patient the benefits, risks, side-effects, alternatives, likelihood of achieving goals, and potential problems during recovery for  the procedure that I have provided informed consent. Date  Time: 07/28/2019 11:24 AM  Pre-Procedure Preparation:  Monitoring: As per clinic protocol. Respiration, ETCO2, SpO2, BP, heart rate and rhythm monitor placed and checked for adequate function Safety Precautions: Patient was assessed for positional comfort and pressure points before starting the procedure. Time-out: I initiated and conducted the "Time-out" before starting the procedure, as per protocol. The patient was asked to participate by confirming the accuracy of the "Time Out" information. Verification of the correct person, site, and procedure were performed and confirmed by me, the nursing staff, and the patient. "Time-out" conducted as per Joint Commission's Universal Protocol (UP.01.01.01). Time: 1238  Description of Procedure:          Target Area: between scaphoid and lunate Approach: Dorsal approach. Area Prepped: Entire wrist Region Prepping solution: DuraPrep (Iodine Povacrylex [0.7% available iodine] and Isopropyl Alcohol, 74% w/w) Safety Precautions: Aspiration looking for blood return was conducted prior to all injections. At no point did we inject any substances, as a needle was being advanced. No attempts were made at seeking any paresthesias. Safe injection practices and needle disposal techniques used. Medications properly checked for expiration dates. SDV (single dose vial) medications used. Description of the Procedure: Protocol guidelines were followed. The patient was placed in position. The target area was identified and the area prepped in the usual manner. Skin & deeper tissues infiltrated with local anesthetic. Appropriate amount of time allowed to pass for local anesthetics to take effect. The procedure needles were then advanced to the target area. Proper needle placement secured. Negative aspiration confirmed. Solution injected in intermittent fashion, asking for systemic symptoms every 0.5cc of injectate. The  needles were then removed and the area cleansed, making sure to leave some of the prepping solution back to take advantage of its long term bactericidal properties.  Vitals:   07/28/19 1214 07/28/19 1219 07/28/19 1224 07/28/19 1228  BP: 138/89 (!) 136/96 134/88 132/88  Pulse:  Resp: 14 16 16 16   Temp:      SpO2: 95% 94% 93% 94%  Weight:      Height:        Start Time: 1238 hrs. End Time: 1245 hrs. Materials:  Needle(s) Type: Regular needle Gauge: 25G Length: 1.5-in Medication(s): Please see orders for medications and dosing details. 1 cc solution consisting of half a cc of 0.2% ropivacaine, half a cc of Decadron 10 mg/cc. Imaging Guidance:          Type of Imaging Technique: None used Indication(s): N/A Exposure Time: No patient exposure Contrast: None used. Fluoroscopic Guidance: N/A Ultrasound Guidance: N/A Interpretation: N/A  Antibiotic Prophylaxis:   Anti-infectives (From admission, onward)   None     Indication(s): None identified  Post-operative Assessment:  Post-procedure Vital Signs:  Pulse/HCG Rate: 7265 Temp: 98.1 F (36.7 C) Resp: 16 BP: 132/88 SpO2: 94 %  EBL: None  Complications: No immediate post-treatment complications observed by team, or reported by patient.  Note: The patient tolerated the entire procedure well. A repeat set of vitals were taken after the procedure and the patient was kept under observation following institutional policy, for this type of procedure. Post-procedural neurological assessment was performed, showing return to baseline, prior to discharge. The patient was provided with post-procedure discharge instructions, including a section on how to identify potential problems. Should any problems arise concerning this procedure, the patient was given instructions to immediately contact us, at any time, without hesitation. In any case, we plan to contact the patient by telephone for a follow-up status report regarding this  interventional procedure.  Comments:  No additional relevant information.  Plan of Care  Orders:  Orders Placed This Encounter  Procedures  . [facility] C-ARM 1-60 MIN NO REPORT    Intraoperative interpretation by procedural physician at [place].    Standing Status:   Standing    Number of Occurrences:   1    Order Specific Question:   Reason for exam:    Answer:   Assistance in needle guidance and placement for procedures requiring needle placement in or near specific anatomical locations not easily accessible without such assistance.   Medications ordered for procedure: Meds ordered this encounter  Medications  . iohexol (OMNIPAQUE) 180 MG/ML injection 10 mL    Must be Myelogram-compatible. If not available, you may substitute with a water-soluble, non-ionic, hypoallergenic, myelogram-compatible radiological contrast medium.  Marland Kitchen lidocaine (XYLOCAINE) 2 % (with pres) injection 400 mg  . ropivacaine (PF) 2 mg/mL (0.2%) (NAROPIN) injection 2 mL  . sodium chloride flush (NS) 0.9 % injection 2 mL  . dexamethasone (DECADRON) injection 10 mg  . dexamethasone (DECADRON) injection 10 mg   Medications administered: We administered iohexol, lidocaine, ropivacaine (PF) 2 mg/mL (0.2%), sodium chloride flush, dexamethasone, and dexamethasone.  See the medical record for exact dosing, route, and time of administration.  Follow-up plan:   Return in about 8 weeks (around 09/22/2019) for Post Procedure Evaluation, in person.      s/p lesi #1 (left L4/5) on 8/17, #2 on 10/21/2018 (left L3/4), #3 on 12/09/2018 (Left L3/4); 02/24/2019: Right wrist injection and left trigger finger (middle finger) injection.    Recent Visits Date Type Provider Dept  06/25/19 Procedure visit Gillis Santa, MD [facility]  Showing recent visits within past 90 days and meeting all other requirements Today's Visits Date Type Provider Dept  07/28/19 Procedure visit Gillis Santa, MD [facility]  Showing today's visits and  meeting all other requirements Future Appointments Date Type Provider Dept  09/23/19 Appointment Gillis Santa, MD [facility]  Showing future appointments within next 90 days and meeting all other requirements  Disposition: Discharge home  Discharge (Date  Time): 07/28/2019; 1249 hrs.   Primary Care Physician: Volney American, PA-C Location: [facility] Note by: Gillis Santa, MD Date: 07/28/2019; Time: 2:10 PM  Disclaimer:  Medicine is not an exact science. The only guarantee in medicine is that nothing is guaranteed. It is important to note that the decision to proceed with this intervention was based on the information collected from the patient. The Data and conclusions were drawn from the patient's questionnaire, the interview, and the physical examination. Because the information was provided in large part by the patient, it cannot be guaranteed that it has not been purposely or unconsciously manipulated. Every effort has been made to obtain as much relevant data as possible for this evaluation. It is important to note that the conclusions that lead to this procedure are derived in large part from the available data. Always take into account that the treatment will also be dependent on availability of resources and existing treatment guidelines, considered by other Pain Management Practitioners as being common knowledge and practice, at the time of the intervention. For Medico-Legal purposes, it is also important to point out that variation in procedural techniques and pharmacological choices are the acceptable norm. The indications, contraindications, technique, and results of the above procedure should only be interpreted and judged by a Board-Certified Interventional Pain Specialist with extensive familiarity and expertise in the same exact procedure and technique.

## 2019-07-28 NOTE — Progress Notes (Signed)
Safety precautions to be maintained throughout the outpatient stay will include: orient to surroundings, keep bed in low position, maintain call bell within reach at all times, provide assistance with transfer out of bed and ambulation.  

## 2019-07-28 NOTE — Patient Instructions (Signed)

## 2019-07-28 NOTE — Patient Instructions (Signed)

## 2019-07-28 NOTE — Progress Notes (Signed)
Patient's Name: [name]  MRN: [id number]  Referring Provider: Gillis Santa, MD  DOB: Sep 15, 1955  PCP: Volney American, PA-C  DOS: 07/28/2019  Note by: Gillis Santa, MD  Service setting: Ambulatory outpatient  Specialty: Interventional Pain Management  Patient type: Established  Location: [facility] (AMB) Pain Management Facility  Visit type: Interventional Procedure   Primary Reason for Visit: Interventional Pain Management Treatment. CC: Back Pain  Procedure:          Anesthesia, Analgesia, Anxiolysis:  Type: Therapeutic Inter-Laminar Epidural Steroid Injection  #1 (in 2021)  Region: Lumbar Level: L3-4 Level. Laterality: Left-Sided         Type: Local Anesthesia  Local Anesthetic: Lidocaine 1-2%  Position: Prone with head of the table was raised to facilitate breathing.   Indications: 1. Primary osteoarthritis of both wrists   2. Chronic radicular lumbar pain   3. Lumbar radiculopathy    Pain Score: Pre-procedure: 5 /10 Post-procedure: 0-No pain (back = 0  wrist = 2 )/10   Patient's last dose of Xarelto was 3 days ago  Pre-op Assessment:  [name] is a 64 y.o. (year old), male patient, seen today for interventional treatment. He  has a past surgical history that includes Knee surgery; Gastric bypass (2002); prostate seeding; Carpal tunnel release (Right, 12/23/2014); Carpal tunnel release (Left, 01/06/2015); Colonoscopy with propofol (N/A, 10/08/2017); and Cardioversion (N/A, 05/15/2019). [name] has a current medication list which includes the following prescription(s): acetaminophen, amiodarone, amlodipine, benazepril, cyanocobalamin, cyclobenzaprine, ezetimibe, gabapentin, salonpas pain relief patch, mirtazapine, multivitamin with minerals, olopatadine hcl, rivaroxaban, rosuvastatin, tramadol, trazodone, and metoprolol succinate. His primarily concern today is the Back Pain  Initial Vital Signs:  Pulse/HCG Rate: 72ECG Heart Rate: 68 Temp: 98.1 F (36.7 C) Resp: 18 BP:  (!) 137/93 SpO2: 96 %  BMI: Estimated body mass index is 48.82 kg/m as calculated from the following:   Height as of this encounter: 6' (1.829 m).   Weight as of this encounter: 360 lb (163.3 kg).  Risk Assessment: Allergies: Reviewed. He has No Known Allergies.  Allergy Precautions: None required Coagulopathies: Reviewed. None identified.  Blood-thinner therapy: None at this time Active Infection(s): Reviewed. None identified. [name] is afebrile  Site Confirmation: [name] was asked to confirm the procedure and laterality before marking the site Procedure checklist: Completed Consent: Before the procedure and under the influence of no sedative(s), amnesic(s), or anxiolytics, the patient was informed of the treatment options, risks and possible complications. To fulfill our ethical and legal obligations, as recommended by the American Medical Association's Code of Ethics, I have informed the patient of my clinical impression; the nature and purpose of the treatment or procedure; the risks, benefits, and possible complications of the intervention; the alternatives, including doing nothing; the risk(s) and benefit(s) of the alternative treatment(s) or procedure(s); and the risk(s) and benefit(s) of doing nothing. The patient was provided information about the general risks and possible complications associated with the procedure. These may include, but are not limited to: failure to achieve desired goals, infection, bleeding, organ or nerve damage, allergic reactions, paralysis, and death. In addition, the patient was informed of those risks and complications associated to Spine-related procedures, such as failure to decrease pain; infection (i.e.: Meningitis, epidural or intraspinal abscess); bleeding (i.e.: epidural hematoma, subarachnoid hemorrhage, or any other type of intraspinal or peri-dural bleeding); organ or nerve damage (i.e.: Any type of peripheral nerve, nerve root, or spinal  cord injury) with subsequent damage to sensory, motor, and/or autonomic systems,  resulting in permanent pain, numbness, and/or weakness of one or several areas of the body; allergic reactions; (i.e.: anaphylactic reaction); and/or death. Furthermore, the patient was informed of those risks and complications associated with the medications. These include, but are not limited to: allergic reactions (i.e.: anaphylactic or anaphylactoid reaction(s)); adrenal axis suppression; blood sugar elevation that in diabetics may result in ketoacidosis or comma; water retention that in patients with history of congestive heart failure may result in shortness of breath, pulmonary edema, and decompensation with resultant heart failure; weight gain; swelling or edema; medication-induced neural toxicity; particulate matter embolism and blood vessel occlusion with resultant organ, and/or nervous system infarction; and/or aseptic necrosis of one or more joints. Finally, the patient was informed that Medicine is not an exact science; therefore, there is also the possibility of unforeseen or unpredictable risks and/or possible complications that may result in a catastrophic outcome. The patient indicated having understood very clearly. We have given the patient no guarantees and we have made no promises. Enough time was given to the patient to ask questions, all of which were answered to the patient's satisfaction. [name] has indicated that he wanted to continue with the procedure. Attestation: I, the ordering provider, attest that I have discussed with the patient the benefits, risks, side-effects, alternatives, likelihood of achieving goals, and potential problems during recovery for the procedure that I have provided informed consent. Date  Time:   Pre-Procedure Preparation:  Monitoring: As per clinic protocol. Respiration, ETCO2, SpO2, BP, heart rate and rhythm monitor placed and checked for adequate function Safety  Precautions: Patient was assessed for positional comfort and pressure points before starting the procedure. Time-out: I initiated and conducted the "Time-out" before starting the procedure, as per protocol. The patient was asked to participate by confirming the accuracy of the "Time Out" information. Verification of the correct person, site, and procedure were performed and confirmed by me, the nursing staff, and the patient. "Time-out" conducted as per Joint Commission's Universal Protocol (UP.01.01.01). Time: 1238  Description of Procedure:          Target Area: The interlaminar space, initially targeting the lower laminar border of the superior vertebral body. Approach: Paramedial approach. Area Prepped: Entire Posterior Lumbar Region Prepping solution: DuraPrep (Iodine Povacrylex [0.7% available iodine] and Isopropyl Alcohol, 74% w/w) Safety Precautions: Aspiration looking for blood return was conducted prior to all injections. At no point did we inject any substances, as a needle was being advanced. No attempts were made at seeking any paresthesias. Safe injection practices and needle disposal techniques used. Medications properly checked for expiration dates. SDV (single dose vial) medications used. Description of the Procedure: Protocol guidelines were followed. The procedure needle was introduced through the skin, ipsilateral to the reported pain, and advanced to the target area. Bone was contacted and the needle walked caudad, until the lamina was cleared. The epidural space was identified using "loss-of-resistance technique" with 2-3 ml of PF-NaCl (0.9% NSS), in a 5cc LOR glass syringe.  Vitals:   07/28/19 1214 07/28/19 1219 07/28/19 1224 07/28/19 1228  BP: 138/89 (!) 136/96 134/88 132/88  Pulse:      Resp: 14 16 16 16   Temp:      SpO2: 95% 94% 93% 94%  Weight:      Height:        Start Time: 1238 hrs. End Time: 1245 hrs.  Materials:  Needle(s) Type: Epidural needle Gauge:  17G Length: 5-in Medication(s): Please see orders for medications and dosing details. 8 cc  solution made of 4 cc of preservative-free saline, 3 cc of 0.2% ropivacaine, 1 cc of Decadron 10 mg/cc.  Imaging Guidance (Spinal):          Type of Imaging Technique: Fluoroscopy Guidance (Spinal) Indication(s): Assistance in needle guidance and placement for procedures requiring needle placement in or near specific anatomical locations not easily accessible without such assistance. Exposure Time: Please see nurses notes. Contrast: Before injecting any contrast, we confirmed that the patient did not have an allergy to iodine, shellfish, or radiological contrast. Once satisfactory needle placement was completed at the desired level, radiological contrast was injected. Contrast injected under live fluoroscopy. No contrast complications. See chart for type and volume of contrast used. Fluoroscopic Guidance: I was personally present during the use of fluoroscopy. "Tunnel Vision Technique" used to obtain the best possible view of the target area. Parallax error corrected before commencing the procedure. "Direction-depth-direction" technique used to introduce the needle under continuous pulsed fluoroscopy. Once target was reached, antero-posterior, oblique, and lateral fluoroscopic projection used confirm needle placement in all planes. Images permanently stored in EMR. Interpretation: I personally interpreted the imaging intraoperatively. Adequate needle placement confirmed in multiple planes. Appropriate spread of contrast into desired area was observed. No evidence of afferent or efferent intravascular uptake. No intrathecal or subarachnoid spread observed. Permanent images saved into the patient's record.  Antibiotic Prophylaxis:   Anti-infectives (From admission, onward)   None     Indication(s): None identified  Post-operative Assessment:  Post-procedure Vital Signs:  Pulse/HCG Rate: 7265 Temp: 98.1 F  (36.7 C) Resp: 16 BP: 132/88 SpO2: 94 %  EBL: None  Complications: No immediate post-treatment complications observed by team, or reported by patient.  Note: The patient tolerated the entire procedure well. A repeat set of vitals were taken after the procedure and the patient was kept under observation following institutional policy, for this type of procedure. Post-procedural neurological assessment was performed, showing return to baseline, prior to discharge. The patient was provided with post-procedure discharge instructions, including a section on how to identify potential problems. Should any problems arise concerning this procedure, the patient was given instructions to immediately contact us, at any time, without hesitation. In any case, we plan to contact the patient by telephone for a follow-up status report regarding this interventional procedure.  Comments:  No additional relevant information. 5 out of 5 strength bilateral lower extremity: Plantar flexion, dorsiflexion, knee flexion, knee extension.  Plan of Care  Orders:  Orders Placed This Encounter  Procedures  . DG PAIN [facility] C-ARM 1-60 MIN NO REPORT    Intraoperative interpretation by procedural physician at [facility].    Standing Status:   Standing    Number of Occurrences:   1    Order Specific Question:   Reason for exam:    Answer:   Assistance in needle guidance and placement for procedures requiring needle placement in or near specific anatomical locations not easily accessible without such assistance.   Patient instructed to restart Xarelto tomorrow so long as he is not having any lower extremity weakness after nursing phone call.  Medications ordered for procedure: Meds ordered this encounter  Medications  . iohexol (OMNIPAQUE) 180 MG/ML injection 10 mL    Must be Myelogram-compatible. If not available, you may substitute with a water-soluble, non-ionic, hypoallergenic, myelogram-compatible  radiological contrast medium.  Marland Kitchen lidocaine (XYLOCAINE) 2 % (with pres) injection 400 mg  . ropivacaine (PF) 2 mg/mL (0.2%) (NAROPIN) injection 2 mL  . sodium chloride flush (NS)  0.9 % injection 2 mL  . dexamethasone (DECADRON) injection 10 mg  . dexamethasone (DECADRON) injection 10 mg   Medications administered: We administered iohexol, lidocaine, ropivacaine (PF) 2 mg/mL (0.2%), sodium chloride flush, dexamethasone, and dexamethasone.  See the medical record for exact dosing, route, and time of administration.  Follow-up plan:   Return in about 8 weeks (around 09/22/2019) for Post Procedure Evaluation, in person.    Recent Visits Date Type Provider Dept  06/25/19 Procedure visit Gillis Santa, MD [facility]  Showing recent visits within past 90 days and meeting all other requirements Today's Visits Date Type Provider Dept  07/28/19 Procedure visit Gillis Santa, MD [facility]  Showing today's visits and meeting all other requirements Future Appointments Date Type Provider Dept  09/23/19 Appointment Gillis Santa, MD [facility]  Showing future appointments within next 90 days and meeting all other requirements  Disposition: Discharge home  Discharge Date & Time: 07/28/2019; 1249 hrs.   Primary Care Physician: Volney American, PA-C Location: [facility] Note by: Gillis Santa, MD Date: 07/28/2019; Time: 2:08 PM  Disclaimer:  Medicine is not an exact science. The only guarantee in medicine is that nothing is guaranteed. It is important to note that the decision to proceed with this intervention was based on the information collected from the patient. The Data and conclusions were drawn from the patient's questionnaire, the interview, and the physical examination. Because the information was provided in large part by the patient, it cannot be guaranteed that it has not been purposely or unconsciously manipulated. Every  effort has been made to obtain as much relevant data as possible for this evaluation. It is important to note that the conclusions that lead to this procedure are derived in large part from the available data. Always take into account that the treatment will also be dependent on availability of resources and existing treatment guidelines, considered by other Pain Management Practitioners as being common knowledge and practice, at the time of the intervention. For Medico-Legal purposes, it is also important to point out that variation in procedural techniques and pharmacological choices are the acceptable norm. The indications, contraindications, technique, and results of the above procedure should only be interpreted and judged by a Board-Certified Interventional Pain Specialist with extensive familiarity and expertise in the same exact procedure and technique.

## 2019-07-29 ENCOUNTER — Telehealth: Payer: Self-pay

## 2019-07-29 NOTE — Telephone Encounter (Signed)
Post procedure phone call.  Patient states he is doing good except a little swimmy headed today.  Instructed to eat something and to call us back if symptoms continue.

## 2019-08-01 ENCOUNTER — Ambulatory Visit (INDEPENDENT_AMBULATORY_CARE_PROVIDER_SITE_OTHER): Payer: 59 | Admitting: Family Medicine

## 2019-08-01 ENCOUNTER — Encounter: Payer: Self-pay | Admitting: Family Medicine

## 2019-08-01 ENCOUNTER — Other Ambulatory Visit: Payer: Self-pay | Admitting: Cardiovascular Disease

## 2019-08-01 ENCOUNTER — Other Ambulatory Visit: Payer: Self-pay

## 2019-08-01 VITALS — BP 135/82 | HR 64 | Temp 98.7°F | Ht 70.0 in | Wt 366.0 lb

## 2019-08-01 DIAGNOSIS — Z0000 Encounter for general adult medical examination without abnormal findings: Secondary | ICD-10-CM | POA: Diagnosis not present

## 2019-08-01 DIAGNOSIS — I480 Paroxysmal atrial fibrillation: Secondary | ICD-10-CM

## 2019-08-01 DIAGNOSIS — I10 Essential (primary) hypertension: Secondary | ICD-10-CM | POA: Diagnosis not present

## 2019-08-01 DIAGNOSIS — D510 Vitamin B12 deficiency anemia due to intrinsic factor deficiency: Secondary | ICD-10-CM

## 2019-08-01 DIAGNOSIS — Z8546 Personal history of malignant neoplasm of prostate: Secondary | ICD-10-CM

## 2019-08-01 DIAGNOSIS — M5117 Intervertebral disc disorders with radiculopathy, lumbosacral region: Secondary | ICD-10-CM | POA: Diagnosis not present

## 2019-08-01 DIAGNOSIS — I209 Angina pectoris, unspecified: Secondary | ICD-10-CM

## 2019-08-01 DIAGNOSIS — G4700 Insomnia, unspecified: Secondary | ICD-10-CM

## 2019-08-01 DIAGNOSIS — E782 Mixed hyperlipidemia: Secondary | ICD-10-CM

## 2019-08-01 LAB — UA/M W/RFLX CULTURE, ROUTINE
Bilirubin, UA: NEGATIVE
Glucose, UA: NEGATIVE
Ketones, UA: NEGATIVE
Leukocytes,UA: NEGATIVE
Nitrite, UA: NEGATIVE
Protein,UA: NEGATIVE
RBC, UA: NEGATIVE
Specific Gravity, UA: 1.025 (ref 1.005–1.030)
Urobilinogen, Ur: 0.2 mg/dL (ref 0.2–1.0)
pH, UA: 5 (ref 5.0–7.5)

## 2019-08-01 MED ORDER — MIRTAZAPINE 15 MG PO TABS: ORAL_TABLET | ORAL | 1 refills | Status: DC

## 2019-08-01 MED ORDER — QUETIAPINE FUMARATE 50 MG PO TABS: 50.0000 mg | ORAL_TABLET | Freq: Every day | ORAL | 0 refills | Status: DC

## 2019-08-01 NOTE — Progress Notes (Signed)
BP 135/82   Pulse 64   Temp 98.7 F (37.1 C) (Oral)   Ht 5\' 10"  (1.778 m)   Wt (!) 366 lb (166 kg)   SpO2 96%   BMI 52.52 kg/m    Subjective:    Patient ID: [name], male    DOB: 09-Mar-1955, 64 y.o.   MRN: [id number]  HPI: [name] is a 64 y.o. male presenting on 08/01/2019 for comprehensive medical examination. Current medical complaints include:see below  Recently seen by Cardiology for atrial fibrillation, HLD, angina. Has had some issues with his atrial fibrillation but current doing well without palpitations, CP, SOB, dizziness, syncope. On xarelto, metoprolol, lasix, benazepril, amlodipine and crestor for these issues. Denies side effects and taking faithfully.   Chroninc low back issues and other arthritis sites - following with Pain Mgmt and Orthopedics for these issues. Currently getting injections in right wrist due to his pain being severe there. On tramadol regimen in addition to gabapentin and flexeril which does help. Denies numbness or tingling down into toes, weakness, gait instability.   Insomnia - currently on remeron and trazodone, states sometimes he has to take 2 trazodone (200 mg) to get to sleep. Does feel his pain plays a large role in his sleep issues.   He currently lives with: Interim Problems from his last visit: no  Depression Screen done today and results listed below:  Depression screen [facility] 2/9 08/01/2019 06/25/2019 09/02/2018 08/13/2018 07/25/2018  Decreased Interest 0 0 0 0 0  Down, Depressed, Hopeless 0 0 0 0 0  PHQ - 2 Score 0 0 0 0 0  Altered sleeping 3 - - - 2  Tired, decreased energy 1 - - - 1  Change in appetite 0 - - - 0  Feeling bad or failure about yourself  0 - - - 0  Trouble concentrating 0 - - - 0  Moving slowly or fidgety/restless 0 - - - 0  Suicidal thoughts 0 - - - 0  PHQ-9 Score 4 - - - 3    The patient does not have a history of falls. I did complete a risk assessment for falls. A plan of care for falls was  documented.   Past Medical History:  Past Medical History:  Diagnosis Date  . Cardiomyopathy (in setting of Afib)    a. 12/2013 Echo: EF 45-50%, mild ant and antsept HK. mild MR. Mod dil LA. nl RV fxn. Rhythm was Afib.  Marland Kitchen DJD (degenerative joint disease) of knee   . History of kidney stones   . Intervertebral disc disorder with radiculopathy of lumbosacral region   . Kidney stone 2012  . Lower extremity edema   . PAF (paroxysmal atrial fibrillation) ([place])   . Pernicious anemia   . Prostate cancer ([place])   . Pulmonary nodule, right    a. 10/2014 Cardiac CTA: 38mm RLL nodule; b. 04/2015 CT Chest: stable 66mm RLL nodule. No new nodules; 02/2017 CTA Chest: stable, benign, 24mm RLL pulm nodule.  . SVT (supraventricular tachycardia) [facility])     Surgical History:  Past Surgical History:  Procedure Laterality Date  . CARDIOVERSION N/A 05/15/2019   Procedure: CARDIOVERSION;  Surgeon: Minna Merritts, MD;  Location: [facility] ORS;  Service: Cardiovascular;  Laterality: N/A;  . CARPAL TUNNEL RELEASE Right 12/23/2014   Procedure: CARPAL TUNNEL RELEASE;  Surgeon: Earnestine Leys, MD;  Location: [facility] ORS;  Service: Orthopedics;  Laterality: Right;  . CARPAL TUNNEL RELEASE Left 01/06/2015   Procedure: CARPAL  TUNNEL RELEASE;  Surgeon: Earnestine Leys, MD;  Location: [facility] ORS;  Service: Orthopedics;  Laterality: Left;  . COLONOSCOPY WITH PROPOFOL N/A 10/08/2017   Procedure: COLONOSCOPY WITH PROPOFOL;  Surgeon: Lin Landsman, MD;  Location: [facility] ENDOSCOPY;  Service: Gastroenterology;  Laterality: N/A;  . GASTRIC BYPASS  2002  . KNEE SURGERY     knee trauma  . prostate seeding      Medications:  Current Outpatient Medications on File Prior to Visit  Medication Sig  . acetaminophen (TYLENOL) 500 MG tablet Take 1,000 mg by mouth every 6 (six) hours as needed for moderate pain or headache.  Marland Kitchen amiodarone (PACERONE) 200 MG tablet Take 1 tablet (200 mg total) by mouth daily.  Marland Kitchen amLODipine (NORVASC) 5 MG tablet  Take 1 tablet (5 mg total) by mouth daily.  . cyanocobalamin (,VITAMIN B-12,) 1000 MCG/ML injection INJECT 1 ML IN THE MUSCLE EVERY 30 DAYS (Patient taking differently: Inject 1,000 mcg into the muscle every 30 (thirty) days. )  . cyclobenzaprine (FLEXERIL) 10 MG tablet TAKE 1 TABLET(10 MG) BY MOUTH THREE TIMES DAILY AS NEEDED FOR MUSCLE SPASMS  . ezetimibe (ZETIA) 10 MG tablet TAKE 1 TABLET(10 MG) BY MOUTH DAILY (Patient taking differently: Take 10 mg by mouth daily. )  . furosemide (LASIX) 20 MG tablet Take 1 tablet (20 mg total) by mouth daily as needed.  . gabapentin (NEURONTIN) 400 MG capsule Take 1 capsule (400 mg total) by mouth 3 (three) times daily. (Patient taking differently: Take 400 mg by mouth 2 (two) times daily. )  . Menthol-Methyl Salicylate (SALONPAS PAIN RELIEF PATCH) PTCH Apply 1 patch topically daily as needed (pain).  . Multiple Vitamin (MULTIVITAMIN WITH MINERALS) TABS tablet Take 1 tablet by mouth daily.  . OLOPATADINE HCL OP Place 1 drop into both eyes daily as needed (allergies).  . rivaroxaban (XARELTO) 20 MG TABS tablet TAKE 1 TABLET(20 MG) BY MOUTH DAILY (Patient taking differently: Take 20 mg by mouth daily. )  . rosuvastatin (CRESTOR) 10 MG tablet Take 1 tablet (10 mg total) by mouth daily.  . traMADol (ULTRAM) 50 MG tablet Take 2 tablets (100 mg total) by mouth 2 (two) times daily as needed for moderate pain or severe pain.  . metoprolol succinate (TOPROL-XL) 50 MG 24 hr tablet Take 1 tablet (50 mg total) by mouth daily. Take with or immediately following a meal.   No current facility-administered medications on file prior to visit.    Allergies:  No Known Allergies  Social History:  Social History   Socioeconomic History  . Marital status: Married    Spouse name: Not on file  . Number of children: Not on file  . Years of education: Not on file  . Highest education level: Not on file  Occupational History  . Not on file  Tobacco Use  . Smoking status:  Former Smoker    Packs/day: 1.00    Years: 10.00    Pack years: 10.00    Types: Cigarettes    Quit date: 02/18/1979    Years since quitting: 40.4  . Smokeless tobacco: Former Systems developer    Types: Snuff  Vaping Use  . Vaping Use: Never used  Substance and Sexual Activity  . Alcohol use: Yes    Alcohol/week: 12.0 standard drinks    Types: 12 Cans of beer per week    Comment: weekly  . Drug use: No  . Sexual activity: Not on file  Other Topics Concern  . Not on file  Social  History Narrative  . Not on file   Social Determinants of Health   Financial Resource Strain:   . Difficulty of Paying Living Expenses:   Food Insecurity:   . Worried About Charity fundraiser in the Last Year:   . Arboriculturist in the Last Year:   Transportation Needs:   . Film/video editor (Medical):   Marland Kitchen Lack of Transportation (Non-Medical):   Physical Activity:   . Days of Exercise per Week:   . Minutes of Exercise per Session:   Stress:   . Feeling of Stress :   Social Connections:   . Frequency of Communication with Friends and Family:   . Frequency of Social Gatherings with Friends and Family:   . Attends Religious Services:   . Active Member of Clubs or Organizations:   . Attends Archivist Meetings:   Marland Kitchen Marital Status:   Intimate Partner Violence:   . Fear of Current or Ex-Partner:   . Emotionally Abused:   Marland Kitchen Physically Abused:   . Sexually Abused:    Social History   Tobacco Use  Smoking Status Former Smoker  . Packs/day: 1.00  . Years: 10.00  . Pack years: 10.00  . Types: Cigarettes  . Quit date: 02/18/1979  . Years since quitting: 40.4  Smokeless Tobacco Former Systems developer  . Types: Snuff   Social History   Substance and Sexual Activity  Alcohol Use Yes  . Alcohol/week: 12.0 standard drinks  . Types: 12 Cans of beer per week   Comment: weekly    Family History:  Family History  Problem Relation Age of Onset  . Heart disease Father   . Hypertension Father   .  Stroke Father   . Cancer Father   . Arrhythmia Sister        A-fib  . Arrhythmia Brother        A-fib    Past medical history, surgical history, medications, allergies, family history and social history reviewed with patient today and changes made to appropriate areas of the chart.   Review of Systems - General ROS: negative Psychological ROS: positive for - sleep disturbances Ophthalmic ROS: negative ENT ROS: negative Allergy and Immunology ROS: negative Hematological and Lymphatic ROS: negative Endocrine ROS: negative Breast ROS: negative for breast lumps Respiratory ROS: no cough, shortness of breath, or wheezing Cardiovascular ROS: no chest pain or dyspnea on exertion Gastrointestinal ROS: no abdominal pain, change in bowel habits, or black or bloody stools Genito-Urinary ROS: no dysuria, trouble voiding, or hematuria Musculoskeletal ROS: positive for - joint pain Neurological ROS: no TIA or stroke symptoms Dermatological ROS: negative All other ROS negative except what is listed above and in the HPI.      Objective:    BP 135/82   Pulse 64   Temp 98.7 F (37.1 C) (Oral)   Ht 5\' 10"  (1.778 m)   Wt (!) 366 lb (166 kg)   SpO2 96%   BMI 52.52 kg/m   Wt Readings from Last 3 Encounters:  08/01/19 (!) 366 lb (166 kg)  07/28/19 (!) 363 lb (164.7 kg)  07/28/19 (!) 360 lb (163.3 kg)    Physical Exam Vitals and nursing note reviewed.  Constitutional:      General: He is not in acute distress.    Appearance: He is well-developed. He is obese.  HENT:     Head: Atraumatic.     Right Ear: Tympanic membrane and external ear normal.  Left Ear: Tympanic membrane and external ear normal.     Nose: Nose normal.     Mouth/Throat:     Mouth: Mucous membranes are moist.     Pharynx: Oropharynx is clear.  Eyes:     General: No scleral icterus.    Conjunctiva/sclera: Conjunctivae normal.     Pupils: Pupils are equal, round, and reactive to light.  Cardiovascular:     Rate  and Rhythm: Normal rate.     Heart sounds: Normal heart sounds. No murmur heard.   Pulmonary:     Effort: Pulmonary effort is normal. No respiratory distress.     Breath sounds: Normal breath sounds.  Abdominal:     General: Bowel sounds are normal. There is no distension.     Palpations: Abdomen is soft. There is no mass.     Tenderness: There is no abdominal tenderness. There is no guarding.  Genitourinary:    Comments: GU exam done through Urology Musculoskeletal:        General: No tenderness. Normal range of motion.     Cervical back: Normal range of motion and neck supple.  Skin:    General: Skin is warm and dry.     Findings: No rash.  Neurological:     General: No focal deficit present.     Mental Status: He is alert and oriented to person, place, and time.     Deep Tendon Reflexes: Reflexes are normal and symmetric.  Psychiatric:        Mood and Affect: Mood normal.        Behavior: Behavior normal.        Thought Content: Thought content normal.        Judgment: Judgment normal.     Results for orders placed or performed in visit on 08/01/19  CBC with Differential/Platelet  Result Value Ref Range   WBC 9.8 3.4 - 10.8 x10E3/uL   RBC 5.25 4.14 - 5.80 x10E6/uL   Hemoglobin 13.4 13.0 - 17.7 g/dL   Hematocrit 42.9 37.5 - 51.0 %   MCV 82 79 - 97 fL   MCH 25.5 (L) 26.6 - 33.0 pg   MCHC 31.2 (L) 31 - 35 g/dL   RDW 15.1 11.6 - 15.4 %   Platelets 223 150 - 450 x10E3/uL   Neutrophils 46 Not Estab. %   Lymphs 39 Not Estab. %   Monocytes 13 Not Estab. %   Eos 1 Not Estab. %   Basos 1 Not Estab. %   Neutrophils Absolute 4.5 1 - 7 x10E3/uL   Lymphocytes Absolute 3.8 (H) 0 - 3 x10E3/uL   Monocytes Absolute 1.3 (H) 0 - 0 x10E3/uL   EOS (ABSOLUTE) 0.1 0.0 - 0.4 x10E3/uL   Basophils Absolute 0.1 0 - 0 x10E3/uL   Immature Granulocytes 0 Not Estab. %   Immature Grans (Abs) 0.0 0.0 - 0.1 x10E3/uL  Comprehensive metabolic panel  Result Value Ref Range   Glucose 80 65 - 99  mg/dL   BUN 16 8 - 27 mg/dL   Creatinine, Ser 1.07 0.76 - 1.27 mg/dL   GFR calc non Af Amer 72 >59 mL/min/1.73   GFR calc Af Amer 84 >59 mL/min/1.73   BUN/Creatinine Ratio 15 10 - 24   Sodium 141 134 - 144 mmol/L   Potassium 4.5 3.5 - 5.2 mmol/L   Chloride 106 96 - 106 mmol/L   CO2 23 20 - 29 mmol/L   Calcium 9.2 8.6 - 10.2 mg/dL   Total Protein  6.7 6.0 - 8.5 g/dL   Albumin 4.6 3.8 - 4.8 g/dL   Globulin, Total 2.1 1.5 - 4.5 g/dL   Albumin/Globulin Ratio 2.2 1.2 - 2.2   Bilirubin Total 0.3 0.0 - 1.2 mg/dL   Alkaline Phosphatase 72 48 - 121 IU/L   AST 20 0 - 40 IU/L   ALT 19 0 - 44 IU/L  TSH  Result Value Ref Range   TSH 2.170 0.450 - 4.500 uIU/mL  UA/M w/rflx Culture, Routine   Specimen: Urine   Urine  Result Value Ref Range   Specific Gravity, UA 1.025 1.005 - 1.030   pH, UA 5.0 5.0 - 7.5   Color, UA Yellow Yellow   Appearance Ur Clear Clear   Leukocytes,UA Negative Negative   Protein,UA Negative Negative/Trace   Glucose, UA Negative Negative   Ketones, UA Negative Negative   RBC, UA Negative Negative   Bilirubin, UA Negative Negative   Urobilinogen, Ur 0.2 0.2 - 1.0 mg/dL   Nitrite, UA Negative Negative  PSA  Result Value Ref Range   Prostate Specific Ag, Serum <0.1 0.0 - 4.0 ng/mL  Lipid Panel w/o Chol/HDL Ratio  Result Value Ref Range   Cholesterol, Total 158 100 - 199 mg/dL   Triglycerides 123 0 - 149 mg/dL   HDL 73 >39 mg/dL   VLDL Cholesterol Cal 21 5 - 40 mg/dL   LDL Chol Calc (NIH) 64 0 - 99 mg/dL      Assessment & Plan:   Problem List Items Addressed This Visit      Cardiovascular and Mediastinum   Essential hypertension - Primary    BP stable, continue per Cardiology recommendations and working on stress control, weight loss      Relevant Orders   CBC with Differential/Platelet (Completed)   Comprehensive metabolic panel (Completed)   TSH (Completed)   UA/M w/rflx Culture, Routine (Completed)   Paroxysmal atrial fibrillation (HCC)    Rate and  rhythm well controlled currently, continue per Cardiology recommendations      Angina pectoris ([place])    Followed by Cardiology, currently stable on regimen. Continue per their recommendations        Nervous and Auditory   Intervertebral disc disorder with radiculopathy of lumbosacral region    Followed by Pain Mgmt, continue per their recommendations on tramadol and supportive medications. Weight loss recommended      Relevant Medications   mirtazapine (REMERON) 15 MG tablet   QUEtiapine (SEROQUEL) 50 MG tablet     Other   History of prostate cancer    Followed by Urology, continue per their recommendations      Relevant Orders   PSA (Completed)   Morbid obesity ([place])    Diet and exercise reviewed      Hyperlipidemia    Recheck lipids, adjust as needed. Continue crestor regimen and diet and exercise changes      Relevant Orders   Lipid Panel w/o Chol/HDL Ratio (Completed)   Pernicious anemia    Recheck blood counts, continue B12 supplementation      Relevant Orders   CBC with Differential/Platelet (Completed)   Insomnia    Concern for serotonin issues with the amount of remeron and trazodone he's currently taking to feel he's getting good sleep as he's self-doubling medications. Will try d/c of trazodone and see if seroquel helps more in combo with remeron. Sleep hygiene reviewed additionally       Other Visit Diagnoses    Annual physical exam  Discussed aspirin prophylaxis for myocardial infarction prevention and decision was made to continue ASA  LABORATORY TESTING:  Health maintenance labs ordered today as discussed above.   The natural history of prostate cancer and ongoing controversy regarding screening and potential treatment outcomes of prostate cancer has been discussed with the patient. The meaning of a false positive PSA and a false negative PSA has been discussed. He indicates understanding of the limitations of this screening test and  wishes not to proceed with screening PSA testing. Followed by Urology for monitoring post-Prostate Cancer   IMMUNIZATIONS:   - Tdap: Tetanus vaccination status reviewed: last tetanus booster within 10 years. - Influenza: Up to date - Pneumovax: Not applicable - Prevnar: Refused - HPV: Not applicable - Zostavax vaccine: Refused  SCREENING: - Colonoscopy: Up to date  Discussed with patient purpose of the colonoscopy is to detect colon cancer at curable precancerous or early stages   PATIENT COUNSELING:    Sexuality: Discussed sexually transmitted diseases, partner selection, use of condoms, avoidance of unintended pregnancy  and contraceptive alternatives.   Advised to avoid cigarette smoking.  I discussed with the patient that most people either abstain from alcohol or drink within safe limits (<=14/week and <=4 drinks/occasion for males, <=7/weeks and <= 3 drinks/occasion for females) and that the risk for alcohol disorders and other health effects rises proportionally with the number of drinks per week and how often a drinker exceeds daily limits.  Discussed cessation/primary prevention of drug use and availability of treatment for abuse.   Diet: Encouraged to adjust caloric intake to maintain  or achieve ideal body weight, to reduce intake of dietary saturated fat and total fat, to limit sodium intake by avoiding high sodium foods and not adding table salt, and to maintain adequate dietary potassium and calcium preferably from fresh fruits, vegetables, and low-fat dairy products.    stressed the importance of regular exercise  Injury prevention: Discussed safety belts, safety helmets, smoke detector, smoking near bedding or upholstery.   Dental health: Discussed importance of regular tooth brushing, flossing, and dental visits.   Follow up plan: NEXT PREVENTATIVE PHYSICAL DUE IN 1 YEAR. Return in about 6 weeks (around 09/12/2019) for sleep f/u.

## 2019-08-02 LAB — COMPREHENSIVE METABOLIC PANEL
ALT: 19 IU/L (ref 0–44)
AST: 20 IU/L (ref 0–40)
Albumin/Globulin Ratio: 2.2 (ref 1.2–2.2)
Albumin: 4.6 g/dL (ref 3.8–4.8)
Alkaline Phosphatase: 72 IU/L (ref 48–121)
BUN/Creatinine Ratio: 15 (ref 10–24)
BUN: 16 mg/dL (ref 8–27)
Bilirubin Total: 0.3 mg/dL (ref 0.0–1.2)
CO2: 23 mmol/L (ref 20–29)
Calcium: 9.2 mg/dL (ref 8.6–10.2)
Chloride: 106 mmol/L (ref 96–106)
Creatinine, Ser: 1.07 mg/dL (ref 0.76–1.27)
GFR calc Af Amer: 84 mL/min/{1.73_m2} (ref >59)
GFR calc non Af Amer: 72 mL/min/{1.73_m2} (ref >59)
Globulin, Total: 2.1 g/dL (ref 1.5–4.5)
Glucose: 80 mg/dL (ref 65–99)
Potassium: 4.5 mmol/L (ref 3.5–5.2)
Sodium: 141 mmol/L (ref 134–144)
Total Protein: 6.7 g/dL (ref 6.0–8.5)

## 2019-08-02 LAB — CBC WITH DIFFERENTIAL/PLATELET
Basophils Absolute: 0.1 10*3/uL (ref 0.0–0.2)
Basos: 1 %
EOS (ABSOLUTE): 0.1 10*3/uL (ref 0.0–0.4)
Eos: 1 %
Hematocrit: 42.9 % (ref 37.5–51.0)
Hemoglobin: 13.4 g/dL (ref 13.0–17.7)
Immature Grans (Abs): 0 10*3/uL (ref 0.0–0.1)
Immature Granulocytes: 0 %
Lymphocytes Absolute: 3.8 10*3/uL — ABNORMAL HIGH (ref 0.7–3.1)
Lymphs: 39 %
MCH: 25.5 pg — ABNORMAL LOW (ref 26.6–33.0)
MCHC: 31.2 g/dL — ABNORMAL LOW (ref 31.5–35.7)
MCV: 82 fL (ref 79–97)
Monocytes Absolute: 1.3 10*3/uL — ABNORMAL HIGH (ref 0.1–0.9)
Monocytes: 13 %
Neutrophils Absolute: 4.5 10*3/uL (ref 1.4–7.0)
Neutrophils: 46 %
Platelets: 223 10*3/uL (ref 150–450)
RBC: 5.25 x10E6/uL (ref 4.14–5.80)
RDW: 15.1 % (ref 11.6–15.4)
WBC: 9.8 10*3/uL (ref 3.4–10.8)

## 2019-08-02 LAB — LIPID PANEL W/O CHOL/HDL RATIO
Cholesterol, Total: 158 mg/dL (ref 100–199)
HDL: 73 mg/dL (ref >39)
LDL Chol Calc (NIH): 64 mg/dL (ref 0–99)
Triglycerides: 123 mg/dL (ref 0–149)
VLDL Cholesterol Cal: 21 mg/dL (ref 5–40)

## 2019-08-02 LAB — TSH: TSH: 2.17 u[IU]/mL (ref 0.450–4.500)

## 2019-08-02 LAB — PSA: Prostate Specific Ag, Serum: <0.1 ng/mL (ref 0.0–4.0)

## 2019-08-03 ENCOUNTER — Encounter: Payer: Self-pay | Admitting: Family Medicine

## 2019-08-06 ENCOUNTER — Other Ambulatory Visit: Payer: Self-pay | Admitting: Family Medicine

## 2019-08-06 MED ORDER — BELSOMRA 10 MG PO TABS: 10.0000 mg | ORAL_TABLET | Freq: Every evening | ORAL | 0 refills | Status: DC | PRN

## 2019-08-06 NOTE — Assessment & Plan Note (Signed)
Concern for serotonin issues with the amount of remeron and trazodone he's currently taking to feel he's getting good sleep as he's self-doubling medications. Will try d/c of trazodone and see if seroquel helps more in combo with remeron. Sleep hygiene reviewed additionally

## 2019-08-06 NOTE — Assessment & Plan Note (Signed)
Followed by Cardiology, currently stable on regimen. Continue per their recommendations

## 2019-08-06 NOTE — Assessment & Plan Note (Signed)
Followed by Urology, continue per their recommendations.   

## 2019-08-06 NOTE — Assessment & Plan Note (Signed)
Rate and rhythm well controlled currently, continue per Cardiology recommendations

## 2019-08-06 NOTE — Assessment & Plan Note (Signed)
Recheck blood counts, continue B12 supplementation

## 2019-08-06 NOTE — Assessment & Plan Note (Addendum)
Recheck lipids, adjust as needed. Continue crestor regimen and diet and exercise changes

## 2019-08-06 NOTE — Assessment & Plan Note (Signed)
Followed by Pain Mgmt, continue per their recommendations on tramadol and supportive medications. Weight loss recommended

## 2019-08-06 NOTE — Assessment & Plan Note (Signed)
Diet and exercise reviewed 

## 2019-08-06 NOTE — Assessment & Plan Note (Signed)
BP stable, continue per Cardiology recommendations and working on stress control, weight loss

## 2019-08-24 ENCOUNTER — Other Ambulatory Visit: Payer: Self-pay | Admitting: Family Medicine

## 2019-08-24 NOTE — Telephone Encounter (Signed)
Requested medication (s) are due for refill today: no  Requested medication (s) are on the active medication list: yes  Last refill:  08/06/19  Future visit scheduled: no  Notes to clinic:  med not assigned to a protocol   Requested Prescriptions  Pending Prescriptions Disp Refills   [place] 10 MG TABS [Pharmacy Med Name: BELSOMRA 10MG  TABLETS] 30 tablet     Sig: TAKE 1 TABLET BY MOUTH AT BEDTIME AS NEEDED      Off-Protocol Failed - 08/24/2019 10:31 AM      Failed - Medication not assigned to a protocol, review manually.      Passed - Valid encounter within last 12 months    Recent Outpatient Visits           3 weeks ago Essential hypertension   [place], [place], [place]   7 months ago Essential hypertension   [facility] [place], [place]   1 year ago Annual physical exam   [place], [place]   1 year ago Viral upper respiratory tract infection   [facility] [place], Jeannette How, [place]   1 year ago Intervertebral disc disorder with radiculopathy of lumbosacral region   [facility], Jeannette How, [place]

## 2019-08-25 NOTE — Telephone Encounter (Signed)
LOV: 08/01/19  Last filled 08/06/19 for 30 with 0 refills.

## 2019-09-02 ENCOUNTER — Other Ambulatory Visit: Payer: Self-pay | Admitting: Family Medicine

## 2019-09-02 NOTE — Telephone Encounter (Signed)
Requested medication (s) are due for refill today: yes  Requested medication (s) are on the active medication list: yes  Last refill:  Belsomra 08/06/19 #30  Future visit scheduled: no  Notes to clinic:  Please review medications for refill. Prescription for Seroquel was discontinued. Unable to refill Belsomra per protocol    Requested Prescriptions  Pending Prescriptions Disp Refills   QUEtiapine (SEROQUEL) 50 MG tablet [Pharmacy Med Name: QUETIAPINE 50MG   TABLETS] 30 tablet 0    Sig: TAKE 1 TABLET(50 MG) BY MOUTH AT BEDTIME      Not Delegated - Psychiatry:  Antipsychotics - Second Generation (Atypical) - quetiapine Failed - 09/02/2019  5:28 PM      Failed - This refill cannot be delegated      Passed - ALT in normal range and within 180 days    ALT  Date Value Ref Range Status  08/01/2019 19 0 - 44 IU/L Final   ALT (SGPT) Piccolo, Waived  Date Value Ref Range Status  12/04/2016 16 10 - 47 U/L Final          Passed - AST in normal range and within 180 days    AST  Date Value Ref Range Status  08/01/2019 20 0 - 40 IU/L Final   AST (SGOT) Piccolo, Waived  Date Value Ref Range Status  12/04/2016 26 11 - 38 U/L Final          Passed - Last BP in normal range    BP Readings from Last 1 Encounters:  08/01/19 135/82          Passed - Valid encounter within last 6 months    Recent Outpatient Visits           1 month ago Essential hypertension   [facility] [place], [place]   7 months ago Essential hypertension   [facility] [place], [place]   1 year ago Annual physical exam   [facility] [place], [place]   1 year ago Viral upper respiratory tract infection   [facility] [place], Jeannette How, [place]   1 year ago Intervertebral disc disorder with radiculopathy of lumbosacral region   [facility] [place], Jeannette How, MD                [place] 10 MG TABS [Pharmacy Med  Name: BELSOMRA 10MG  TABLETS] 30 tablet     Sig: TAKE 1 TABLET BY MOUTH AT BEDTIME AS NEEDED      Off-Protocol Failed - 09/02/2019  5:28 PM      Failed - Medication not assigned to a protocol, review manually.      Passed - Valid encounter within last 12 months    Recent Outpatient Visits           1 month ago Essential hypertension   [place], [place], [place]   7 months ago Essential hypertension   [facility] [place], [place]   1 year ago Annual physical exam   [place], [place]   1 year ago Viral upper respiratory tract infection   [facility] [place], Jeannette How, [place]   1 year ago Intervertebral disc disorder with radiculopathy of lumbosacral region   [facility], Jeannette How, [place]

## 2019-09-03 NOTE — Telephone Encounter (Signed)
See message below from [facility].  °

## 2019-09-03 NOTE — Telephone Encounter (Signed)
Belsomra refilled

## 2019-09-08 ENCOUNTER — Other Ambulatory Visit: Payer: Self-pay

## 2019-09-08 DIAGNOSIS — D510 Vitamin B12 deficiency anemia due to intrinsic factor deficiency: Secondary | ICD-10-CM

## 2019-09-08 MED ORDER — CYANOCOBALAMIN 1000 MCG/ML IJ SOLN: INTRAMUSCULAR | 12 refills | Status: DC

## 2019-09-08 NOTE — Telephone Encounter (Signed)
[facility] fax Rx refill request

## 2019-09-10 ENCOUNTER — Other Ambulatory Visit: Payer: Self-pay | Admitting: Family Medicine

## 2019-09-10 NOTE — Telephone Encounter (Signed)
LOV: 08/01/19

## 2019-09-10 NOTE — Telephone Encounter (Signed)
Requested medications are due for refill today?  Yes - This refill cannot be delegated.    Requested medications are on active medication list? Yes  Last Refill:   06/10/2019 # 60 with one refill   Future visit scheduled?  No   Notes to Clinic:  This refill cannot be delegated.

## 2019-09-23 ENCOUNTER — Ambulatory Visit
Payer: 59 | Attending: Student in an Organized Health Care Education/Training Program | Admitting: Student in an Organized Health Care Education/Training Program

## 2019-09-23 ENCOUNTER — Encounter: Payer: Self-pay | Admitting: Student in an Organized Health Care Education/Training Program

## 2019-09-23 ENCOUNTER — Other Ambulatory Visit: Payer: Self-pay

## 2019-09-23 VITALS — BP 137/93 | HR 66 | Temp 97.3°F | Resp 18 | Ht 71.0 in | Wt 366.0 lb

## 2019-09-23 DIAGNOSIS — M19031 Primary osteoarthritis, right wrist: Secondary | ICD-10-CM | POA: Insufficient documentation

## 2019-09-23 DIAGNOSIS — M19032 Primary osteoarthritis, left wrist: Secondary | ICD-10-CM | POA: Diagnosis present

## 2019-09-23 DIAGNOSIS — G894 Chronic pain syndrome: Secondary | ICD-10-CM | POA: Diagnosis present

## 2019-09-23 DIAGNOSIS — M47816 Spondylosis without myelopathy or radiculopathy, lumbar region: Secondary | ICD-10-CM

## 2019-09-23 NOTE — Progress Notes (Signed)
PROVIDER NOTE: Information contained herein reflects review and annotations entered in association with encounter. Interpretation of such information and data should be left to medically-trained personnel. Information provided to patient can be located elsewhere in the medical record under "Patient Instructions". Document created using STT-dictation technology, any transcriptional errors that may result from process are unintentional.    Patient: [name]  Service Category: E/M  Provider: Gillis Santa, MD  DOB: 1955/07/05  DOS: 09/23/2019  Specialty: Interventional Pain Management  MRN: [id number]  Setting: Ambulatory outpatient  PCP: Volney American, PA-C  Type: Established Patient    Referring Provider: Volney American,*  Location: Office  Delivery: Face-to-face     HPI  Reason for encounter: [name], a 65 y.o. year old male, is here today for evaluation and management of his Primary osteoarthritis of both wrists [M19.031, M19.032]. Mr. Stipp primary complain today is Back Pain (low) and Wrist Pain (right) Last encounter: Practice (07/29/2019). My last encounter with him was on 07/28/2019. Pertinent problems: [name] has H/O gastric bypass; DJD (degenerative joint disease) of knee; Bilateral carpal tunnel syndrome; Paroxysmal atrial fibrillation ([place]); Chronic radicular lumbar pain; Lumbar radiculopathy; Lumbar degenerative disc disease; Lumbar facet arthropathy; Lumbar facet joint syndrome; Primary osteoarthritis of both wrists; Trigger middle finger of left hand; and Chronic pain syndrome on their pertinent problem list. Pain Assessment: Severity of Chronic pain is reported as a 1 /10. Location: Back Lower/denies. Onset: More than a month ago. Quality: Dull. Timing: Intermittent. Modifying factor(s): procedure. Vitals:  height is 5' 11" (1.803 m) and weight is 366 lb (166 kg) (abnormal). His temperature is 97.3 F (36.3 C) (abnormal). His blood pressure is 137/93  (abnormal) and his pulse is 66. His respiration is 18 and oxygen saturation is 98%.    Post-Procedure Evaluation  Procedure (07/28/2019): Right wrist dorsal scaphoid injection #2; left L3-L4 ESI #1 (for 2021) Sedation: Please see nurses note.  Effectiveness during initial hour after procedure(Ultra-Short Term Relief): 100 %  Local anesthetic used: Long-acting (4-6 hours) Effectiveness: Defined as any analgesic benefit obtained secondary to the administration of local anesthetics. This carries significant diagnostic value as to the etiological location, or anatomical origin, of the pain. Duration of benefit is expected to coincide with the duration of the local anesthetic used.  Effectiveness during initial 4-6 hours after procedure(Short-Term Relief): 100 %  Long-term benefit: Defined as any relief past the pharmacologic duration of the local anesthetics.  Effectiveness past the initial 6 hours after procedure(Long-Term Relief): 50 % (lasting 1 week) for right wrist, 75% for low back   Current benefits: Defined as benefit that persist at this time.   Analgesia:  <50% better for the right wrist, greater than 50% for the low back Function: Somewhat improved low back ROM: Somewhat improved low back    ROS  Constitutional: Denies any fever or chills Gastrointestinal: No reported hemesis, hematochezia, vomiting, or acute GI distress Musculoskeletal: Right wrist and right hand pain Neurological: No reported episodes of acute onset apraxia, aphasia, dysarthria, agnosia, amnesia, paralysis, loss of coordination, or loss of consciousness  Medication Review  Olopatadine HCl, Salonpas Pain Relief Patch, Suvorexant, acetaminophen, amLODipine, amiodarone, benazepril, cyanocobalamin, cyclobenzaprine, ezetimibe, furosemide, gabapentin, metoprolol succinate, mirtazapine, multivitamin with minerals, rivaroxaban, rosuvastatin, and traMADol  History Review  Allergy: [name] has No Known  Allergies. Drug: [name]  reports no history of drug use. Alcohol:  reports current alcohol use of about 12.0 standard drinks of alcohol per week. Tobacco:  reports that  he quit smoking about 40 years ago. His smoking use included cigarettes. He has a 10.00 pack-year smoking history. He has quit using smokeless tobacco.  His smokeless tobacco use included snuff. Social: [name]  reports that he quit smoking about 40 years ago. His smoking use included cigarettes. He has a 10.00 pack-year smoking history. He has quit using smokeless tobacco.  His smokeless tobacco use included snuff. He reports current alcohol use of about 12.0 standard drinks of alcohol per week. He reports that he does not use drugs. Medical:  has a past medical history of Cardiomyopathy (in setting of Afib), DJD (degenerative joint disease) of knee, History of kidney stones, Intervertebral disc disorder with radiculopathy of lumbosacral region, Kidney stone (2012), Lower extremity edema, PAF (paroxysmal atrial fibrillation) ([place]), Pernicious anemia, Prostate cancer ([place]), Pulmonary nodule, right, and SVT (supraventricular tachycardia) ([place]). Surgical: [name]  has a past surgical history that includes Knee surgery; Gastric bypass (2002); prostate seeding; Carpal tunnel release (Right, 12/23/2014); Carpal tunnel release (Left, 01/06/2015); Colonoscopy with propofol (N/A, 10/08/2017); and Cardioversion (N/A, 05/15/2019). Family: family history includes Arrhythmia in his brother and sister; Cancer in his father; Heart disease in his father; Hypertension in his father; Stroke in his father.  Laboratory Chemistry Profile   Renal Lab Results  Component Value Date   BUN 16 08/01/2019   CREATININE 1.07 08/01/2019   BCR 15 08/01/2019   GFRAA 84 08/01/2019   GFRNONAA 72 08/01/2019     Hepatic Lab Results  Component Value Date   AST 20 08/01/2019   ALT 19 08/01/2019   ALBUMIN 4.6 08/01/2019   ALKPHOS 72 08/01/2019   LIPASE 28  03/13/2017     Electrolytes Lab Results  Component Value Date   NA 141 08/01/2019   K 4.5 08/01/2019   CL 106 08/01/2019   CALCIUM 9.2 08/01/2019   MG 2.0 05/05/2019     Bone No results found for: VD25OH, VD125OH2TOT, AJ6811XB2, IO0355HR4, 25OHVITD1, 25OHVITD2, 25OHVITD3, TESTOFREE, TESTOSTERONE   Inflammation (CRP: Acute Phase) (ESR: Chronic Phase) No results found for: CRP, ESRSEDRATE, LATICACIDVEN     Note: Above Lab results reviewed.  Physical Exam  General appearance: Well nourished, well developed, and well hydrated. In no apparent acute distress Mental status: Alert, oriented x 3 (person, place, & time)       Respiratory: No evidence of acute respiratory distress Eyes: PERLA Vitals: BP (!) 137/93   Pulse 66   Temp (!) 97.3 F (36.3 C)   Resp 18   Ht 5' 11" (1.803 m)   Wt (!) 366 lb (166 kg)   SpO2 98%   BMI 51.05 kg/m  BMI: Estimated body mass index is 51.05 kg/m as calculated from the following:   Height as of this encounter: 5' 11" (1.803 m).   Weight as of this encounter: 366 lb (166 kg). Ideal: Ideal body weight: 75.3 kg (166 lb 0.1 oz) Adjusted ideal body weight: 111.6 kg (246 lb 0.1 oz)   Pain along dorsum of right wrist, tender to palpation.  Limited range of motion secondary to pain Left trigger finger resolved.  Lumbar Spine Area Exam  Skin & Axial Inspection: No masses, redness, or swelling Alignment: Symmetrical Functional ROM: Improved after treatment       Stability: No instability detected Muscle Tone/Strength: Functionally intact. No obvious neuro-muscular anomalies detected. Sensory (Neurological): Improved  Gait & Posture Assessment  Ambulation: Unassisted Gait: Modified gait pattern (slower gait speed, wider stride width, and longer stance duration) associated with morbid  obesity Posture: Difficulty standing up straight, due to pain  Lower Extremity Exam    Side: Right lower extremity  Side: Left lower extremity  Stability: No  instability observed          Stability: No instability observed          Skin & Extremity Inspection: Skin color, temperature, and hair growth are WNL. No peripheral edema or cyanosis. No masses, redness, swelling, asymmetry, or associated skin lesions. No contractures.  Skin & Extremity Inspection: Skin color, temperature, and hair growth are WNL. No peripheral edema or cyanosis. No masses, redness, swelling, asymmetry, or associated skin lesions. No contractures.  Functional ROM: Restricted ROM for knee joint          Functional ROM: Restricted ROM for knee joint          Muscle Tone/Strength: Functionally intact. No obvious neuro-muscular anomalies detected.  Muscle Tone/Strength: Functionally intact. No obvious neuro-muscular anomalies detected.  Sensory (Neurological): Unimpaired        Sensory (Neurological): Unimpaired        DTR: Patellar: deferred today Achilles: deferred today Plantar: deferred today  DTR: Patellar: deferred today Achilles: deferred today Plantar: deferred today  Palpation: No palpable anomalies  Palpation: No palpable anomalies    Assessment   Status Diagnosis  Controlled Controlled Controlled 1. Primary osteoarthritis of both wrists (Right)   2. Lumbar facet arthropathy   3. Chronic pain syndrome      Updated Problems: Problem  Primary Osteoarthritis of Both Wrists  Trigger Middle Finger of Left Hand  Chronic Pain Syndrome  Chronic Radicular Lumbar Pain  Lumbar Radiculopathy  Lumbar Degenerative Disc Disease  Lumbar Facet Arthropathy  Lumbar Facet Joint Syndrome  Paroxysmal Atrial Fibrillation ([facility])  Djd (Degenerative Joint Disease) of Knee  Bilateral Carpal Tunnel Syndrome  H/O Gastric Bypass    Plan of Care    Repeat right dorsal scaphoid wrist injection for wrist osteoarthritis.  Patient has been evaluated by orthopedic surgery and has been offered surgical intervention for his wrist osteoarthritis however has decided not to proceed with  surgical intervention at this time and would like to continue with intermittent palliative steroid injections in his right wrist.  I am glad to hear that his left trigger finger is better.  Patient is also noticing benefit after his previous lumbar epidural steroid injection.  Can repeat in the future if he has return of pain.  Orders:  Orders Placed This Encounter  Procedures  . Medium Joint Injection/Arthrocentesis    Right wrist injection for wrist OA Indication(s): Sub-acute pain   Follow-up plan:   Return in about 2 weeks (around 10/07/2019) for right wrist injection , without sedation.     s/p lesi #1 (left L4/5) on 8/17, #2 on 10/21/2018 (left L3/4), #3 on 12/09/2018 (Left L3/4); 02/24/2019: Right wrist injection and left trigger finger (middle finger) injection.     Recent Visits Date Type Provider Dept  07/28/19 Procedure visit Gillis Santa, MD [facility]  06/25/19 Procedure visit Gillis Santa, MD [facility]  Showing recent visits within past 90 days and meeting all other requirements Today's Visits Date Type Provider Dept  09/23/19 Office Visit Gillis Santa, MD [facility]  Showing today's visits and meeting all other requirements Future Appointments No visits were found meeting these conditions. Showing future appointments within next 90 days and meeting all other requirements  I discussed the assessment and treatment plan with the patient. The patient was provided an opportunity to ask  questions and all were answered. The patient agreed with the plan and demonstrated an understanding of the instructions.  Patient advised to call back or seek an in-person evaluation if the symptoms or condition worsens.  Duration of encounter: 79mnutes.  Note by: BGillis Santa MD Date: 09/23/2019; Time: 8:36 AM

## 2019-09-23 NOTE — Patient Instructions (Signed)
Preparing for your procedure (without sedation) ?Instructions: ?Oral Intake: Do not eat or drink anything for at least 3 hours prior to your procedure. ?Transportation: Unless otherwise stated by your physician, you may drive yourself after the procedure. ?Blood Pressure Medicine: Take your blood pressure medicine with a sip of water the morning of the procedure. ?Insulin: Take only ? of your normal insulin dose. ?Preventing infections: Shower with an antibacterial soap the morning of your procedure. ?Build-up your immune system: Take 1000 mg of Vitamin C with every meal (3 times a day) the day prior to your procedure. ?Pregnancy: If you are pregnant, call and cancel the procedure. ?Sickness: If you have a cold, fever, or any active infections, call and cancel the procedure. ?Arrival: You must be in the facility at least 30 minutes prior to your scheduled procedure. ?Children: Do not bring any children with you. ?Dress appropriately: Bring dark clothing that you would not mind if they get stained. ?Valuables: Do not bring any jewelry or valuables. ?Procedure appointments are reserved for interventional treatments only. ?No Prescription Refills. ?No medication changes will be discussed during procedure appointments. ?No disability issues will be discussed.  ?

## 2019-09-23 NOTE — Progress Notes (Signed)
Safety precautions to be maintained throughout the outpatient stay will include: orient to surroundings, keep bed in low position, maintain call bell within reach at all times, provide assistance with transfer out of bed and ambulation.  

## 2019-10-05 ENCOUNTER — Other Ambulatory Visit: Payer: Self-pay | Admitting: Family Medicine

## 2019-10-05 NOTE — Telephone Encounter (Signed)
Requested medication (s) are due for refill today: yes  Requested medication (s) are on the active medication list: yes  Last refill:  09/03/19  Future visit scheduled: no  Notes to clinic:  Medication not assigned to a protocol.    Requested Prescriptions  Pending Prescriptions Disp Refills   BELSOMRA 10 MG TABS [Pharmacy Med Name: BELSOMRA 10MG  TABLETS] 30 tablet     Sig: TAKE 1 TABLET BY MOUTH AT BEDTIME AS NEEDED      Off-Protocol Failed - 10/05/2019  3:27 PM      Failed - Medication not assigned to a protocol, review manually.      Passed - Valid encounter within last 12 months    Recent Outpatient Visits           2 months ago Essential hypertension   [place], [place], [place]   8 months ago Essential hypertension   [facility] Volney American, [place]   1 year ago Annual physical exam   [place], [place]   1 year ago Viral upper respiratory tract infection   [facility] [place], Jeannette How, MD   1 year ago Intervertebral disc disorder with radiculopathy of lumbosacral region   [facility], Jeannette How, [place]

## 2019-10-06 NOTE — Telephone Encounter (Signed)
Called patient to schedule a follow up. Patient states he will call back to schedule.

## 2019-10-06 NOTE — Telephone Encounter (Signed)
Routing to provider  

## 2019-10-13 ENCOUNTER — Telehealth: Payer: Self-pay | Admitting: Student in an Organized Health Care Education/Training Program

## 2019-10-13 NOTE — Telephone Encounter (Signed)
Pts wife called and stated that [facility] said that our clinic used the same code as the hospital does and that they will not pay for the same code twice and that it needs to be fixed. Code number [phone]

## 2019-10-15 ENCOUNTER — Ambulatory Visit
Admission: RE | Admit: 2019-10-15 | Discharge: 2019-10-15 | Disposition: A | Discharge status: Home / Self Care | Payer: 59 | Source: Ambulatory Visit | Attending: Student in an Organized Health Care Education/Training Program | Admitting: Student in an Organized Health Care Education/Training Program

## 2019-10-15 ENCOUNTER — Ambulatory Visit (HOSPITAL_BASED_OUTPATIENT_CLINIC_OR_DEPARTMENT_OTHER): Payer: 59 | Admitting: Student in an Organized Health Care Education/Training Program

## 2019-10-15 ENCOUNTER — Other Ambulatory Visit: Payer: Self-pay

## 2019-10-15 ENCOUNTER — Encounter: Payer: Self-pay | Admitting: Student in an Organized Health Care Education/Training Program

## 2019-10-15 VITALS — BP 139/85 | HR 70 | Temp 98.4°F | Resp 16 | Ht 71.0 in | Wt 363.0 lb

## 2019-10-15 DIAGNOSIS — M47816 Spondylosis without myelopathy or radiculopathy, lumbar region: Secondary | ICD-10-CM

## 2019-10-15 DIAGNOSIS — M19031 Primary osteoarthritis, right wrist: Secondary | ICD-10-CM | POA: Insufficient documentation

## 2019-10-15 DIAGNOSIS — Z931 Gastrostomy status: Secondary | ICD-10-CM | POA: Diagnosis not present

## 2019-10-15 DIAGNOSIS — Z79899 Other long term (current) drug therapy: Secondary | ICD-10-CM | POA: Diagnosis not present

## 2019-10-15 DIAGNOSIS — M65332 Trigger finger, left middle finger: Secondary | ICD-10-CM | POA: Diagnosis not present

## 2019-10-15 DIAGNOSIS — M25531 Pain in right wrist: Secondary | ICD-10-CM | POA: Diagnosis present

## 2019-10-15 DIAGNOSIS — M19032 Primary osteoarthritis, left wrist: Secondary | ICD-10-CM | POA: Insufficient documentation

## 2019-10-15 MED ORDER — ROPIVACAINE HCL 2 MG/ML IJ SOLN
INTRAMUSCULAR | Status: AC
  Filled 2019-10-15: qty 10

## 2019-10-15 MED ORDER — DEXAMETHASONE SODIUM PHOSPHATE 10 MG/ML IJ SOLN
10.0000 mg | Freq: Once | INTRAMUSCULAR | Status: AC
  Administered 2019-10-15: 10 mg
  Filled 2019-10-15: qty 1

## 2019-10-15 MED ORDER — LIDOCAINE HCL 2 % IJ SOLN
20.0000 mL | Freq: Once | INTRAMUSCULAR | Status: AC
  Administered 2019-10-15: 200 mg
  Filled 2019-10-15: qty 10

## 2019-10-15 MED ORDER — DEXAMETHASONE SODIUM PHOSPHATE 10 MG/ML IJ SOLN
INTRAMUSCULAR | Status: AC
  Filled 2019-10-15: qty 1

## 2019-10-15 NOTE — Progress Notes (Signed)
Safety precautions to be maintained throughout the outpatient stay will include: orient to surroundings, keep bed in low position, maintain call bell within reach at all times, provide assistance with transfer out of bed and ambulation.  

## 2019-10-15 NOTE — Progress Notes (Signed)
PROVIDER NOTE: Information contained herein reflects review and annotations entered in association with encounter. Interpretation of such information and data should be left to medically-trained personnel. Information provided to patient can be located elsewhere in the medical record under "Patient Instructions". Document created using STT-dictation technology, any transcriptional errors that may result from process are unintentional.    Patient: [name]  Service Category: Procedure  Provider: Gillis Santa, MD  DOB: 04/30/1955  DOS: 10/15/2019  Location: [facility]  MRN: [id number]  Setting: Ambulatory - outpatient  Referring Provider: Gillis Santa, MD  Type: Established Patient  Specialty: Interventional Pain Management  PCP: Volney American, PA-C   Primary Reason for Visit: Interventional Pain Management Treatment. CC: Pain (right wrist pain)  Procedure:          Anesthesia, Analgesia, Anxiolysis:  Type: Therapeutic Wrist Steroid Injection  #3  Region: Dorsal Scaphoid Level: Wrist Laterality: Bilateral  Type: Local Anesthesia Indication(s): Analgesia         Local Anesthetic: Lidocaine 1-2% Route: Infiltration (/IM) IV Access: Declined Sedation: Declined   Position: Sitting   Indications: 1. Primary osteoarthritis of both wrists (Right)   2. Trigger middle finger of left hand   3. Lumbar facet arthropathy      Pain Score: Pre-procedure: 3 /10 Post-procedure: 0-No pain/10   Pre-op Assessment:  [name] is a 64 y.o. (year old), male patient, seen today for interventional treatment. He  has a past surgical history that includes Knee surgery; Gastric bypass (2002); prostate seeding; Carpal tunnel release (Right, 12/23/2014); Carpal tunnel release (Left, 01/06/2015); Colonoscopy with propofol (N/A, 10/08/2017); and Cardioversion (N/A, 05/15/2019). [name] has a current medication list which includes the following prescription(s): acetaminophen,  amiodarone, amlodipine, belsomra, benazepril, cyanocobalamin, cyclobenzaprine, ezetimibe, furosemide, gabapentin, salonpas pain relief patch, metoprolol succinate, mirtazapine, multivitamin with minerals, olopatadine hcl, rivaroxaban, rosuvastatin, and tramadol. His primarily concern today is the Pain (right wrist pain)  Initial Vital Signs:  Pulse/HCG Rate: 82  Temp: 98.4 F (36.9 C) Resp: 18 BP: (!) 153/82 SpO2: 96 %  BMI: Estimated body mass index is 50.63 kg/m as calculated from the following:   Height as of this encounter: 5\' 11"  (1.803 m).   Weight as of this encounter: 363 lb (164.7 kg).  Risk Assessment: Allergies: Reviewed. He has No Known Allergies.  Allergy Precautions: None required Coagulopathies: Reviewed. None identified.  Blood-thinner therapy: None at this time Active Infection(s): Reviewed. None identified. [name] is afebrile  Site Confirmation: [name] was asked to confirm the procedure and laterality before marking the site Procedure checklist: Completed Consent: Before the procedure and under the influence of no sedative(s), amnesic(s), or anxiolytics, the patient was informed of the treatment options, risks and possible complications. To fulfill our ethical and legal obligations, as recommended by the American Medical Association's Code of Ethics, I have informed the patient of my clinical impression; the nature and purpose of the treatment or procedure; the risks, benefits, and possible complications of the intervention; the alternatives, including doing nothing; the risk(s) and benefit(s) of the alternative treatment(s) or procedure(s); and the risk(s) and benefit(s) of doing nothing. The patient was provided information about the general risks and possible complications associated with the procedure. These may include, but are not limited to: failure to achieve desired goals, infection, bleeding, organ or nerve damage, allergic reactions, paralysis, and  death. In addition, the patient was informed of those risks and complications associated to the procedure, such as failure to decrease pain; infection; bleeding;  organ or nerve damage with subsequent damage to sensory, motor, and/or autonomic systems, resulting in permanent pain, numbness, and/or weakness of one or several areas of the body; allergic reactions; (i.e.: anaphylactic reaction); and/or death. Furthermore, the patient was informed of those risks and complications associated with the medications. These include, but are not limited to: allergic reactions (i.e.: anaphylactic or anaphylactoid reaction(s)); adrenal axis suppression; blood sugar elevation that in diabetics may result in ketoacidosis or comma; water retention that in patients with history of congestive heart failure may result in shortness of breath, pulmonary edema, and decompensation with resultant heart failure; weight gain; swelling or edema; medication-induced neural toxicity; particulate matter embolism and blood vessel occlusion with resultant organ, and/or nervous system infarction; and/or aseptic necrosis of one or more joints. Finally, the patient was informed that Medicine is not an exact science; therefore, there is also the possibility of unforeseen or unpredictable risks and/or possible complications that may result in a catastrophic outcome. The patient indicated having understood very clearly. We have given the patient no guarantees and we have made no promises. Enough time was given to the patient to ask questions, all of which were answered to the patient's satisfaction. [name] has indicated that he wanted to continue with the procedure. Attestation: I, the ordering provider, attest that I have discussed with the patient the benefits, risks, side-effects, alternatives, likelihood of achieving goals, and potential problems during recovery for the procedure that I have provided informed consent. Date  Time: 10/15/2019   7:49 AM  Pre-Procedure Preparation:  Monitoring: As per clinic protocol. Respiration, ETCO2, SpO2, BP, heart rate and rhythm monitor placed and checked for adequate function Safety Precautions: Patient was assessed for positional comfort and pressure points before starting the procedure. Time-out: I initiated and conducted the "Time-out" before starting the procedure, as per protocol. The patient was asked to participate by confirming the accuracy of the "Time Out" information. Verification of the correct person, site, and procedure were performed and confirmed by me, the nursing staff, and the patient. "Time-out" conducted as per Joint Commission's Universal Protocol (UP.01.01.01). Time: 0829  Description of Procedure:          Target Area: between scaphoid and lunate (bilateral) Approach: Dorsal approach. Area Prepped: Entire wrist Region Prepping solution: DuraPrep (Iodine Povacrylex [0.7% available iodine] and Isopropyl Alcohol, 74% w/w) Safety Precautions: Aspiration looking for blood return was conducted prior to all injections. At no point did we inject any substances, as a needle was being advanced. No attempts were made at seeking any paresthesias. Safe injection practices and needle disposal techniques used. Medications properly checked for expiration dates. SDV (single dose vial) medications used. Description of the Procedure: Protocol guidelines were followed. The patient was placed in position. The target area was identified and the area prepped in the usual manner. Skin & deeper tissues infiltrated with local anesthetic. Appropriate amount of time allowed to pass for local anesthetics to take effect. The procedure needles were then advanced to the target area. Proper needle placement secured. Negative aspiration confirmed. Solution injected in intermittent fashion, asking for systemic symptoms every 0.5cc of injectate. The needles were then removed and the area cleansed, making sure to  leave some of the prepping solution back to take advantage of its long term bactericidal properties.  Vitals:   10/15/19 0756 10/15/19 0832  BP: (!) 153/82 139/85  Pulse: 82 70  Resp: 18 16  Temp: 98.4 F (36.9 C)   TempSrc: Temporal   SpO2: 96% 96%  Weight: (!) 363 lb (164.7 kg)   Height: 5\' 11"  (1.803 m)     Start Time: 0829 hrs. End Time: 0832 hrs. Materials:  Needle(s) Type: Regular needle Gauge: 25G Length: 1.5-in Medication(s): Please see orders for medications and dosing details. 3 cc solution made of 2 cc of 0.2% ropivacaine, 1 cc of Decadron 10 mg/cc.  1.5 cc injected in each wrist Imaging Guidance:          Type of Imaging Technique: Fluoroscopy Guidance (Non-spinal) Indication(s): Assistance in needle guidance and placement for procedures requiring needle placement in or near specific anatomical locations not easily accessible without such assistance. Exposure Time: No patient exposure Contrast: None used. Fluoroscopic Guidance: I was personally present during the use of fluoroscopy. "Tunnel Vision Technique" used to obtain the best possible view of the target area. Parallax error corrected before commencing the procedure. "Direction-depth-direction" technique used to introduce the needle under continuous pulsed fluoroscopy. Once target was reached, antero-posterior, oblique, and lateral fluoroscopic projection used confirm needle placement in all planes. Images permanently stored in EMR. Ultrasound Guidance: N/A Interpretation: N/A  Antibiotic Prophylaxis:   Anti-infectives (From admission, onward)   None     Indication(s): None identified  Post-operative Assessment:  Post-procedure Vital Signs:  Pulse/HCG Rate: 70  Temp: 98.4 F (36.9 C) Resp: 16 BP: 139/85 SpO2: 96 %  EBL: None  Complications: No immediate post-treatment complications observed by team, or reported by patient.  Note: The patient tolerated the entire procedure well. A repeat set of  vitals were taken after the procedure and the patient was kept under observation following institutional policy, for this type of procedure. Post-procedural neurological assessment was performed, showing return to baseline, prior to discharge. The patient was provided with post-procedure discharge instructions, including a section on how to identify potential problems. Should any problems arise concerning this procedure, the patient was given instructions to immediately contact us, at any time, without hesitation. In any case, we plan to contact the patient by telephone for a follow-up status report regarding this interventional procedure.  Comments:  No additional relevant information.  Plan of Care  Orders:  Orders Placed This Encounter  Procedures  . [facility] PA[facility] C-ARM 1-60 MIN NO REPORT    Intraoperative interpretation by procedural physician at [place].    Standing Status:   Standing    Number of Occurrences:   1    Order Specific Question:   Reason for exam:    Answer:   Assistance in needle guidance and placement for procedures requiring needle placement in or near specific anatomical locations not easily accessible without such assistance.   Medications ordered for procedure: Meds ordered this encounter  Medications  . lidocaine (XYLOCAINE) 2 % (with pres) injection 400 mg  . dexamethasone (DECADRON) injection 10 mg   Medications administered: We administered lidocaine and dexamethasone.  See the medical record for exact dosing, route, and time of administration.  Follow-up plan:   Return if symptoms worsen or fail to improve.      s/p lesi #1 (left L4/5) on 8/17, #2 on 10/21/2018 (left L3/4), #3 on 12/09/2018 (Left L3/4); 02/24/2019: Right wrist injection and left trigger finger (middle finger) injection.,  Bilateral wrist injection 10/15/2019    Recent Visits Date Type Provider Dept  09/23/19 Office Visit Gillis Santa, MD [facility]  07/28/19  Procedure visit Gillis Santa, MD [facility]  Showing recent visits within past 90 days and meeting all other requirements Today's Visits Date Type  Provider Dept  10/15/19 Procedure visit Gillis Santa, MD [facility]  Showing today's visits and meeting all other requirements Future Appointments No visits were found meeting these conditions. Showing future appointments within next 90 days and meeting all other requirements  Disposition: Discharge home  Discharge (Date  Time): 10/15/2019; 0835 hrs.   Primary Care Physician: Volney American, PA-C Location: [facility] Note by: Gillis Santa, MD Date: 10/15/2019; Time: 9:04 AM  Disclaimer:  Medicine is not an exact science. The only guarantee in medicine is that nothing is guaranteed. It is important to note that the decision to proceed with this intervention was based on the information collected from the patient. The Data and conclusions were drawn from the patient's questionnaire, the interview, and the physical examination. Because the information was provided in large part by the patient, it cannot be guaranteed that it has not been purposely or unconsciously manipulated. Every effort has been made to obtain as much relevant data as possible for this evaluation. It is important to note that the conclusions that lead to this procedure are derived in large part from the available data. Always take into account that the treatment will also be dependent on availability of resources and existing treatment guidelines, considered by other Pain Management Practitioners as being common knowledge and practice, at the time of the intervention. For Medico-Legal purposes, it is also important to point out that variation in procedural techniques and pharmacological choices are the acceptable norm. The indications, contraindications, technique, and results of the above procedure should only be interpreted  and judged by a Board-Certified Interventional Pain Specialist with extensive familiarity and expertise in the same exact procedure and technique.

## 2019-10-15 NOTE — Patient Instructions (Signed)

## 2019-10-16 ENCOUNTER — Telehealth: Payer: Self-pay

## 2019-10-16 NOTE — Telephone Encounter (Signed)
Post procedure follow up.  States he is doing good.

## 2019-10-22 ENCOUNTER — Ambulatory Visit: Payer: 59 | Admitting: Physician Assistant

## 2019-10-23 ENCOUNTER — Other Ambulatory Visit: Payer: Self-pay | Admitting: Family Medicine

## 2019-10-29 ENCOUNTER — Other Ambulatory Visit: Payer: Self-pay | Admitting: Cardiovascular Disease

## 2019-10-30 ENCOUNTER — Ambulatory Visit: Payer: 59 | Admitting: Unknown Physician Specialty

## 2019-10-30 ENCOUNTER — Encounter: Payer: Self-pay | Admitting: Unknown Physician Specialty

## 2019-10-30 ENCOUNTER — Other Ambulatory Visit: Payer: Self-pay

## 2019-10-30 DIAGNOSIS — G4700 Insomnia, unspecified: Secondary | ICD-10-CM | POA: Diagnosis not present

## 2019-10-30 NOTE — Assessment & Plan Note (Addendum)
OK with Remeron plus Trazadone.  No serotonin syndrome noted.  May need to switch back to Belsomra intermittently

## 2019-10-30 NOTE — Progress Notes (Signed)
   BP 133/88 (BP Location: Left Arm, Patient Position: Sitting, Cuff Size: Large)   Pulse 63   Temp 98.3 F (36.8 C) (Oral)   Ht 5\' 11"  (1.803 m)   Wt (!) 363 lb (164.7 kg)   SpO2 95%   BMI 50.63 kg/m    Subjective:    Patient ID: [name], male    DOB: 29-Jun-1955, 64 y.o.   MRN: [id number]  HPI: [name] is a 64 y.o. male  Chief Complaint  Patient presents with  . Follow-up   Insomnia Pt is here for f/u of insomnia. States he was taking Trazadone.  Got switched to Belsomra due to no effect.  Was told he needs to come back in to be checked before refill.  However restarted Trazadone and has done well.  He is also taking Mirtazepine and Tramadol with concerns for Serotonin syndrome.  Pt denies serotonin symptoms.   Tested negative for sleep apnea  Relevant past medical, surgical, family and social history reviewed and updated as indicated. Interim medical history since our last visit reviewed. Allergies and medications reviewed and updated.  Review of Systems  Per HPI unless specifically indicated above     Objective:    BP 133/88 (BP Location: Left Arm, Patient Position: Sitting, Cuff Size: Large)   Pulse 63   Temp 98.3 F (36.8 C) (Oral)   Ht 5\' 11"  (1.803 m)   Wt (!) 363 lb (164.7 kg)   SpO2 95%   BMI 50.63 kg/m   Wt Readings from Last 3 Encounters:  10/30/19 (!) 363 lb (164.7 kg)  10/15/19 (!) 363 lb (164.7 kg)  09/23/19 (!) 366 lb (166 kg)    Physical Exam Constitutional:      General: He is not in acute distress.    Appearance: Normal appearance. He is well-developed.  HENT:     Head: Normocephalic and atraumatic.  Eyes:     General: Lids are normal. No scleral icterus.       Right eye: No discharge.        Left eye: No discharge.     Conjunctiva/sclera: Conjunctivae normal.  Cardiovascular:     Rate and Rhythm: Normal rate.  Pulmonary:     Effort: Pulmonary effort is normal.  Abdominal:     Palpations: There is no hepatomegaly or  splenomegaly.  Musculoskeletal:        General: Normal range of motion.  Skin:    Coloration: Skin is not pale.     Findings: No rash.  Neurological:     Mental Status: He is alert and oriented to person, place, and time.  Psychiatric:        Behavior: Behavior normal.        Thought Content: Thought content normal.        Judgment: Judgment normal.        Assessment & Plan:   Problem List Items Addressed This Visit      Unprioritized   Insomnia    OK with Remeron plus Trazadone.  No serotonin syndrome noted.  May need to switch back to Belsomra intermittently          Follow up plan: Return in about 3 months (around 01/30/2020).

## 2019-11-27 ENCOUNTER — Other Ambulatory Visit: Payer: Self-pay | Admitting: Student in an Organized Health Care Education/Training Program

## 2019-12-03 ENCOUNTER — Telehealth: Payer: Self-pay | Admitting: Cardiovascular Disease

## 2019-12-03 NOTE — Telephone Encounter (Signed)
Spoke to pt. Notified pt 30 day Rx card left at front desk with samples for Xarelto 20mg . Pt verbalized understanding and is very appreciative.

## 2019-12-03 NOTE — Telephone Encounter (Signed)
Patient calling  States he went to pick up Xarelto medication but price has increased Would like to know if we have any coupon cards he can use Please call to discuss

## 2020-01-01 ENCOUNTER — Other Ambulatory Visit: Payer: Self-pay | Admitting: Student in an Organized Health Care Education/Training Program

## 2020-01-04 ENCOUNTER — Other Ambulatory Visit: Payer: Self-pay | Admitting: Family Medicine

## 2020-01-04 NOTE — Telephone Encounter (Signed)
Requested medication (s) are due for refill today: yes  Requested medication (s) are on the active medication list: trazodone: no (historical meds/provider)      cyclobenzaprine: yes  Last refill:  trazodone: 10/30/19      cyclobenzaprine: 09/11/19  Future visit scheduled: yes  Notes to clinic:  Has not established with [facility] yet   Requested Prescriptions  Pending Prescriptions Disp Refills   traZODone (DESYREL) 100 MG tablet [Pharmacy Med Name: TRAZODONE 100MG  TABLETS] 90 tablet     Sig: TAKE 1 TABLET(100 MG) BY MOUTH AT BEDTIME      Psychiatry: Antidepressants - Serotonin Modulator Passed - 01/04/2020  9:56 AM      Passed - Valid encounter within last 6 months    Recent Outpatient Visits           2 months ago Insomnia, unspecified type   [facility] Kathrine Haddock, NP   5 months ago Essential hypertension   [facility] Merrie Roof [place], [place]   11 months ago Essential hypertension   [place], Lilia Argue, [place]   1 year ago Annual physical exam   [facility] Volney American, [place]   1 year ago Viral upper respiratory tract infection   [facility] Crissman, Jeannette How, MD       Future Appointments             In 1 month Karamalegos, Devonne Doughty, DO [facility], [place]               cyclobenzaprine (FLEXERIL) 10 MG tablet [Pharmacy Med Name: CYCLOBENZAPRINE 10MG  TABLETS] 60 tablet 1    Sig: TAKE 1 TABLET(10 MG) BY MOUTH THREE TIMES DAILY AS NEEDED FOR MUSCLE SPASMS      Not Delegated - Analgesics:  Muscle Relaxants Failed - 01/04/2020  9:56 AM      Failed - This refill cannot be delegated      Passed - Valid encounter within last 6 months    Recent Outpatient Visits           2 months ago Insomnia, unspecified type   [facility] Kathrine Haddock, NP   5 months ago Essential hypertension   [facility] Merrie Roof  [place], [place]   11 months ago Essential hypertension   [facility] Volney American, [place]   1 year ago Annual physical exam   [facility] Volney American, [place]   1 year ago Viral upper respiratory tract infection   [facility] Crissman, Jeannette How, MD       Future Appointments             In 1 month [place], Devonne Doughty, DO [facility], [facility]

## 2020-01-05 NOTE — Telephone Encounter (Signed)
Previous RL patient. Last seen 10/30/19, last CPE 08/01/19.

## 2020-01-08 ENCOUNTER — Ambulatory Visit
Payer: 59 | Attending: Student in an Organized Health Care Education/Training Program | Admitting: Student in an Organized Health Care Education/Training Program

## 2020-01-08 ENCOUNTER — Other Ambulatory Visit: Payer: Self-pay

## 2020-01-08 ENCOUNTER — Encounter: Payer: Self-pay | Admitting: Student in an Organized Health Care Education/Training Program

## 2020-01-08 VITALS — BP 141/95 | HR 59 | Temp 97.1°F | Resp 20 | Ht 71.0 in | Wt 365.0 lb

## 2020-01-08 DIAGNOSIS — G894 Chronic pain syndrome: Secondary | ICD-10-CM | POA: Diagnosis present

## 2020-01-08 DIAGNOSIS — M19032 Primary osteoarthritis, left wrist: Secondary | ICD-10-CM

## 2020-01-08 DIAGNOSIS — G8929 Other chronic pain: Secondary | ICD-10-CM

## 2020-01-08 DIAGNOSIS — M47816 Spondylosis without myelopathy or radiculopathy, lumbar region: Secondary | ICD-10-CM

## 2020-01-08 DIAGNOSIS — M19031 Primary osteoarthritis, right wrist: Secondary | ICD-10-CM | POA: Insufficient documentation

## 2020-01-08 DIAGNOSIS — M65332 Trigger finger, left middle finger: Secondary | ICD-10-CM | POA: Diagnosis present

## 2020-01-08 DIAGNOSIS — M170 Bilateral primary osteoarthritis of knee: Secondary | ICD-10-CM | POA: Diagnosis present

## 2020-01-08 DIAGNOSIS — M25561 Pain in right knee: Secondary | ICD-10-CM

## 2020-01-08 DIAGNOSIS — M25562 Pain in left knee: Secondary | ICD-10-CM | POA: Insufficient documentation

## 2020-01-08 DIAGNOSIS — M5416 Radiculopathy, lumbar region: Secondary | ICD-10-CM | POA: Diagnosis present

## 2020-01-08 MED ORDER — TRAMADOL HCL 50 MG PO TABS: 50.0000 mg | ORAL_TABLET | Freq: Four times a day (QID) | ORAL | 2 refills | Status: DC | PRN

## 2020-01-08 NOTE — Progress Notes (Signed)
Nursing Pain Medication Assessment:  Safety precautions to be maintained throughout the outpatient stay will include: orient to surroundings, keep bed in low position, maintain call bell within reach at all times, provide assistance with transfer out of bed and ambulation.  Medication Inspection Compliance: [name] did not comply with our request to bring his pills to be counted. He was reminded that bringing the medication bottles, even when empty, is a requirement.  Medication: None brought in. Pill/Patch Count: None available to be counted. Bottle Appearance: No container available. Did not bring bottle(s) to appointment. Filled Date: N/A Last Medication intake:  Today

## 2020-01-08 NOTE — Progress Notes (Signed)
PROVIDER NOTE: Information contained herein reflects review and annotations entered in association with encounter. Interpretation of such information and data should be left to medically-trained personnel. Information provided to patient can be located elsewhere in the medical record under "Patient Instructions". Document created using STT-dictation technology, any transcriptional errors that may result from process are unintentional.    Patient: [name]  Service Category: E/M  Provider: Gillis Santa, MD  DOB: [birth year]  DOS: 01/08/2020  Specialty: Interventional Pain Management  MRN: [id number]  Setting: Ambulatory outpatient  PCP: Volney American, PA-C  Type: Established Patient    Referring Provider: Volney American,*  Location: Office  Delivery: Face-to-face     HPI  Mr. SHIRL LUDINGTON, a 64 y.o. year old male, is here today because of his Trigger middle finger of left hand [M65.332]. Mr. Carriker primary complain today is Knee Pain (Left and right), Hip Pain (bilateral), and Wrist Pain (bilateral) Last encounter: My last encounter with him was on 01/01/2020. Pertinent problems: Mr. Verrette has H/O gastric bypass; DJD (degenerative joint disease) of knee; Bilateral carpal tunnel syndrome; Paroxysmal atrial fibrillation ([place]); Chronic radicular lumbar pain; Lumbar radiculopathy; Lumbar degenerative disc disease; Lumbar facet arthropathy; Lumbar facet joint syndrome; Primary osteoarthritis of both wrists; Trigger middle finger of left hand; and Chronic pain syndrome on their pertinent problem list. Pain Assessment: Severity of Chronic pain is reported as a 8 /10. Location: Knee Left/radiates that feels like electricity to ankle. Onset: More than a month ago. Quality:  (electricity). Timing: Intermittent. Modifying factor(s):  Marland Kitchen Vitals:  height is 5' 11"  (1.803 m) and weight is 365 lb (165.6 kg) (abnormal). His temperature is 97.1 F (36.2 C) (abnormal). His blood pressure is 141/95  (abnormal) and his pulse is 59 (abnormal). His respiration is 20 and oxygen saturation is 97%.   Reason for encounter: both, medication management and post-procedure assessment.    Patient injured left knee while going upstairs, has had 2 shots, one with orthopedics and one at Dr Sanjuan Dame however it has not helped.  These were steroid injections.  We discussed advanced therapies for his bilateral knee pain including viscosupplementation with Hyalgan as well as genicular nerve block and possible radiofrequency ablation.  Patient would like to proceed with viscosupplementation with Hyalgan first.  I discussed that these are a series of injection and will start the first 1 in approximately 2 weeks.  Patient endorsed understanding. Otherwise significant pain relief and improvement in ROM after scaphoid wrist injection.  Continues to have swelling however states that he is able to do more with his wrist and is overall in less pain.  Can repeat as needed. Refill of tramadol today as well.  Post-Procedure Evaluation  Procedure (10/14/20):  Type: Therapeutic Wrist Steroid Injection  #3  Region: Dorsal Scaphoid Level: Wrist Laterality: Bilateral   Sedation: Please see nurses note.  Effectiveness during initial hour after procedure(Ultra-Short Term Relief): 100 %  Local anesthetic used: Long-acting (4-6 hours) Effectiveness: Defined as any analgesic benefit obtained secondary to the administration of local anesthetics. This carries significant diagnostic value as to the etiological location, or anatomical origin, of the pain. Duration of benefit is expected to coincide with the duration of the local anesthetic used.  Effectiveness during initial 4-6 hours after procedure(Short-Term Relief): 100 %.  Long-term benefit: Defined as any relief past the pharmacologic duration of the local anesthetics.  Effectiveness past the initial 6 hours after procedure(Long-Term Relief): 80 %   Current benefits: Defined  as benefit that persist  at this time.   Analgesia:  >75% relief Function: Mr. Kirtz reports improvement in function ROM: Mr. Kawahara reports improvement in ROM    Pharmacotherapy Assessment   08/28/2019  06/05/2019   1  Tramadol Hcl 50 Mg Tablet  120.00  30  Bi Lat  [id number]  [facility] ([id number])  2/2  20.00 MME  Comm Ins  [place]      Analgesic: Tramadol 50 mg 4 times daily as needed, quantity 120/month; MME equals 20    Monitoring: [place] PMP: PDMP reviewed during this encounter.       Pharmacotherapy: No side-effects or adverse reactions reported. Compliance: No problems identified. Effectiveness: Clinically acceptable.  [name], RN  01/08/2020 10:08 AM  Signed Nursing Pain Medication Assessment:  Safety precautions to be maintained throughout the outpatient stay will include: orient to surroundings, keep bed in low position, maintain call bell within reach at all times, provide assistance with transfer out of bed and ambulation.  Medication Inspection Compliance: Mr. Brandon did not comply with our request to bring his pills to be counted. He was reminded that bringing the medication bottles, even when empty, is a requirement.  Medication: None brought in. Pill/Patch Count: None available to be counted. Bottle Appearance: No container available. Did not bring bottle(s) to appointment. Filled Date: N/A Last Medication intake:  Today    UDS:  Summary  Date Value Ref Range Status  02/27/2019 Note  Final    Comment:    ==================================================================== Compliance Drug Analysis, Ur ==================================================================== Test                             Result       Flag       Units Drug Present and Declared for Prescription Verification   Tramadol                       >2463        EXPECTED   ng/mg creat   O-Desmethyltramadol            1297         EXPECTED   ng/mg creat   N-Desmethyltramadol            >2463        EXPECTED    ng/mg creat    Source of tramadol is a prescription medication. O-desmethyltramadol    and N-desmethyltramadol are expected metabolites of tramadol.   Gabapentin                     PRESENT      EXPECTED   Cyclobenzaprine                PRESENT      EXPECTED   Desmethylcyclobenzaprine       PRESENT      EXPECTED    Desmethylcyclobenzaprine is an expected metabolite of    cyclobenzaprine.   Mirtazapine                    PRESENT      EXPECTED   Trazodone                      PRESENT      EXPECTED   1,3 chlorophenyl piperazine    PRESENT      EXPECTED    1,3-chlorophenyl piperazine is an expected metabolite of trazodone.   Metoprolol  PRESENT      EXPECTED Drug Present not Declared for Prescription Verification   Acetaminophen                  PRESENT      UNEXPECTED   Ibuprofen                      PRESENT      UNEXPECTED   Diphenhydramine                PRESENT      UNEXPECTED ==================================================================== Test                      Result    Flag   Units      Ref Range   Creatinine              203              mg/dL      >=20 ==================================================================== Declared Medications:  The flagging and interpretation on this report are based on the  following declared medications.  Unexpected results may arise from  inaccuracies in the declared medications.  **Note: The testing scope of this panel includes these medications:  Cyclobenzaprine  Gabapentin  Metoprolol  Mirtazapine  Tramadol  Trazodone  **Note: The testing scope of this panel does not include the  following reported medications:  Alendronate  Amlodipine  Benazepril  Cyanocobalamin  Ezetimibe  Rivaroxaban  Rosuvastatin ==================================================================== For clinical consultation, please call [phone]. ====================================================================      ROS   Constitutional: Denies any fever or chills Gastrointestinal: No reported hemesis, hematochezia, vomiting, or acute GI distress Musculoskeletal: Bilateral knee pain, worse with weightbearing Neurological: No reported episodes of acute onset apraxia, aphasia, dysarthria, agnosia, amnesia, paralysis, loss of coordination, or loss of consciousness  Medication Review  Olopatadine HCl, Salonpas Pain Relief Patch, acetaminophen, amLODipine, amiodarone, benazepril, cyanocobalamin, cyclobenzaprine, ezetimibe, furosemide, gabapentin, metoprolol succinate, mirtazapine, multivitamin with minerals, rivaroxaban, rosuvastatin, traMADol, and traZODone  History Review  Allergy: Mr. Higinbotham has No Known Allergies. Drug: Mr. Arana  reports no history of drug use. Alcohol:  reports current alcohol use of about 12.0 standard drinks of alcohol per week. Tobacco:  reports that he quit smoking about 40 years ago. His smoking use included cigarettes. He has a 10.00 pack-year smoking history. He has quit using smokeless tobacco.  His smokeless tobacco use included snuff. Social: Mr. Gilliam  reports that he quit smoking about 40 years ago. His smoking use included cigarettes. He has a 10.00 pack-year smoking history. He has quit using smokeless tobacco.  His smokeless tobacco use included snuff. He reports current alcohol use of about 12.0 standard drinks of alcohol per week. He reports that he does not use drugs. Medical:  has a past medical history of Cardiomyopathy (in setting of Afib), DJD (degenerative joint disease) of knee, History of kidney stones, Intervertebral disc disorder with radiculopathy of lumbosacral region, Kidney stone (2012), Lower extremity edema, PAF (paroxysmal atrial fibrillation) ([place]), Pernicious anemia, Prostate cancer ([place]), Pulmonary nodule, right, and SVT (supraventricular tachycardia) ([place]). Surgical: Mr. Recore  has a past surgical history that includes Knee surgery; Gastric bypass (2002);  prostate seeding; Carpal tunnel release (Right, 12/23/2014); Carpal tunnel release (Left, 01/06/2015); Colonoscopy with propofol (N/A, 10/08/2017); and Cardioversion (N/A, 05/15/2019). Family: family history includes Arrhythmia in his brother and sister; Cancer in his father; Heart disease in his father; Hypertension in his father; Stroke in his father.  Laboratory  Chemistry Profile   Renal Lab Results  Component Value Date   BUN 16 08/01/2019   CREATININE 1.07 08/01/2019   BCR 15 08/01/2019   GFRAA 84 08/01/2019   GFRNONAA 72 08/01/2019     Hepatic Lab Results  Component Value Date   AST 20 08/01/2019   ALT 19 08/01/2019   ALBUMIN 4.6 08/01/2019   ALKPHOS 72 08/01/2019   LIPASE 28 03/13/2017     Electrolytes Lab Results  Component Value Date   NA 141 08/01/2019   K 4.5 08/01/2019   CL 106 08/01/2019   CALCIUM 9.2 08/01/2019   MG 2.0 05/05/2019     Bone No results found for: VD25OH, VD125OH2TOT, QQ2297LG9, QJ1941DE0, 25OHVITD1, 25OHVITD2, 25OHVITD3, TESTOFREE, TESTOSTERONE   Inflammation (CRP: Acute Phase) (ESR: Chronic Phase) No results found for: CRP, ESRSEDRATE, LATICACIDVEN     Note: Above Lab results reviewed.   Physical Exam  General appearance: Well nourished, well developed, and well hydrated. In no apparent acute distress Mental status: Alert, oriented x 3 (person, place, & time)       Respiratory: No evidence of acute respiratory distress Eyes: PERLA Vitals: BP (!) 141/95   Pulse (!) 59   Temp (!) 97.1 F (36.2 C)   Resp 20   Ht 5' 11"  (1.803 m)   Wt (!) 365 lb (165.6 kg)   SpO2 97%   BMI 50.91 kg/m  BMI: Estimated body mass index is 50.91 kg/m as calculated from the following:   Height as of this encounter: 5' 11"  (1.803 m).   Weight as of this encounter: 365 lb (165.6 kg). Ideal: Ideal body weight: 75.3 kg (166 lb 0.1 oz) Adjusted ideal body weight: 111.4 kg (245 lb 9.7 oz)  Dorsal scaphoid pain improved, improved range of motion of bilateral  wrists.  Lateral edema present of wrist.  Gait & Posture Assessment  Ambulation: Limited Gait: Modified gait pattern (slower gait speed, wider stride width, and longer stance duration) associated with morbid obesity Posture: Difficulty standing up straight, due to pain  Lower Extremity Exam    Side: Right lower extremity  Side: Left lower extremity  Stability: No instability observed          Stability: No instability observed          Skin & Extremity Inspection: Skin color, temperature, and hair growth are WNL. No peripheral edema or cyanosis. No masses, redness, swelling, asymmetry, or associated skin lesions. No contractures.  Skin & Extremity Inspection: Skin color, temperature, and hair growth are WNL. No peripheral edema or cyanosis. No masses, redness, swelling, asymmetry, or associated skin lesions. No contractures.  Functional ROM: Pain restricted ROM for hip and knee joints          Functional ROM: Pain restricted ROM for hip and knee joints          Muscle Tone/Strength: Functionally intact. No obvious neuro-muscular anomalies detected.  Muscle Tone/Strength: Functionally intact. No obvious neuro-muscular anomalies detected.  Sensory (Neurological): Arthropathic arthralgia        Sensory (Neurological): Arthropathic arthralgia        DTR: Patellar: deferred today Achilles: deferred today Plantar: deferred today  DTR: Patellar: deferred today Achilles: deferred today Plantar: deferred today  Palpation: No palpable anomalies  Palpation: No palpable anomalies    Assessment   Status Diagnosis  Improved Responding Controlled 1. Trigger middle finger of left hand   2. Primary osteoarthritis of both wrists (Right)   3. Chronic pain syndrome   4. Chronic radicular  lumbar pain   5. Morbid obesity ([place])   6. Lumbar facet joint syndrome   7. Lumbar facet arthropathy   8. Lumbar radiculopathy   9. Chronic pain of both knees   10. Bilateral primary osteoarthritis of knee        Plan of Care  Mr. MAURISIO RUDDY has a current medication list which includes the following long-term medication(s): amiodarone, amlodipine, benazepril, ezetimibe, furosemide, gabapentin, mirtazapine, rivaroxaban, rosuvastatin, trazodone, and metoprolol succinate.  Pharmacotherapy (Medications Ordered): Meds ordered this encounter  Medications  . traMADol (ULTRAM) 50 MG tablet    Sig: Take 1 tablet (50 mg total) by mouth every 6 (six) hours as needed.    Dispense:  120 tablet    Refill:  2    For chronic pain syndrome   Orders:  Orders Placed This Encounter  Procedures  . KNEE INJECTION    Hyalgan knee injection. Please order Hyalgan.    Standing Status:   Future    Standing Expiration Date:   02/08/2020    Scheduling Instructions:     Procedure: Intra-articular Hyalgan Knee injection            Side: Bilateral     Sedation: None     Timeframe: in two (2) weeks    Order Specific Question:   Where will this procedure be performed?    Answer:   [facility]   Follow-up plan:   Return in about 2 weeks (around 01/22/2020) for B/L knee hyalgan #1.     s/p lesi #1 (left L4/5) on 8/17, #2 on 10/21/2018 (left L3/4), #3 on 12/09/2018 (Left L3/4); 02/24/2019: Right wrist injection and left trigger finger (middle finger) injection.,  Bilateral wrist injection 10/15/2019     Recent Visits Date Type Provider Dept  10/15/19 Procedure visit Gillis Santa, MD [facility]  Showing recent visits within past 90 days and meeting all other requirements Today's Visits Date Type Provider Dept  01/08/20 Office Visit Gillis Santa, MD [facility]  Showing today's visits and meeting all other requirements Future Appointments Date Type Provider Dept  01/26/20 Appointment [facility], [facility]  04/06/20 Appointment Gillis Santa, MD [facility]  Showing future appointments within next 90 days and meeting all other requirements  I discussed the  assessment and treatment plan with the patient. The patient was provided an opportunity to ask questions and all were answered. The patient agreed with the plan and demonstrated an understanding of the instructions.  Patient advised to call back or seek an in-person evaluation if the symptoms or condition worsens.  Duration of encounter: 30 minutes.  Note by: Gillis Santa, MD Date: 01/08/2020; Time: 10:42 AM

## 2020-01-08 NOTE — Patient Instructions (Signed)
Tramadol to last until 04/07/20 has been escribed to your pharmacy.

## 2020-01-13 ENCOUNTER — Encounter: Payer: Self-pay | Admitting: Student in an Organized Health Care Education/Training Program

## 2020-01-26 ENCOUNTER — Ambulatory Visit
Payer: 59 | Attending: Student in an Organized Health Care Education/Training Program | Admitting: Student in an Organized Health Care Education/Training Program

## 2020-01-26 ENCOUNTER — Other Ambulatory Visit: Payer: Self-pay

## 2020-01-26 ENCOUNTER — Encounter: Payer: Self-pay | Admitting: Student in an Organized Health Care Education/Training Program

## 2020-01-26 VITALS — BP 119/75 | HR 57 | Temp 97.1°F | Resp 16 | Ht 72.0 in | Wt 370.0 lb

## 2020-01-26 DIAGNOSIS — Z79891 Long term (current) use of opiate analgesic: Secondary | ICD-10-CM | POA: Diagnosis not present

## 2020-01-26 DIAGNOSIS — Z79899 Other long term (current) drug therapy: Secondary | ICD-10-CM | POA: Diagnosis not present

## 2020-01-26 DIAGNOSIS — G894 Chronic pain syndrome: Secondary | ICD-10-CM | POA: Insufficient documentation

## 2020-01-26 DIAGNOSIS — M170 Bilateral primary osteoarthritis of knee: Secondary | ICD-10-CM | POA: Diagnosis not present

## 2020-01-26 DIAGNOSIS — Z9884 Bariatric surgery status: Secondary | ICD-10-CM | POA: Diagnosis not present

## 2020-01-26 MED ORDER — SODIUM HYALURONATE (VISCOSUP) 20 MG/2ML IX SOSY: 2.0000 mL | PREFILLED_SYRINGE | Freq: Once | INTRA_ARTICULAR | Status: AC

## 2020-01-26 MED ORDER — LIDOCAINE HCL (PF) 2 % IJ SOLN
INTRAMUSCULAR | Status: AC
  Filled 2020-01-26: qty 5

## 2020-01-26 MED ORDER — LIDOCAINE HCL 2 % IJ SOLN
20.0000 mL | Freq: Once | INTRAMUSCULAR | Status: AC
  Administered 2020-01-26: 100 mg
  Filled 2020-01-26: qty 20

## 2020-01-26 NOTE — Progress Notes (Signed)
Safety precautions to be maintained throughout the outpatient stay will include: orient to surroundings, keep bed in low position, maintain call bell within reach at all times, provide assistance with transfer out of bed and ambulation.  

## 2020-01-26 NOTE — Progress Notes (Signed)
PROVIDER NOTE: Information contained herein reflects review and annotations entered in association with encounter. Interpretation of such information and data should be left to medically-trained personnel. Information provided to patient can be located elsewhere in the medical record under "Patient Instructions". Document created using STT-dictation technology, any transcriptional errors that may result from process are unintentional.    Patient: [name]  Service Category: Procedure  Provider: Edward JollyBilal Taksh Hjort, MD  DOB: 1955/04/28  DOS: 01/26/2020  Location: [facility]  MRN: [id number]  Setting: Ambulatory - outpatient  Referring Provider: Particia NearingLane, Rachel Hill,*  Type: Established Patient  Specialty: Interventional Pain Management  PCP: Wayne NearingLane, Rachel Elizabeth, PA-C   Primary Reason for Visit: Interventional Pain Management Treatment. CC: Knee Pain (bilateral)  Procedure:          Anesthesia, Analgesia, Anxiolysis:  Type: Diagnostic Intra-Articular Hyalgan Knee Injection #1  Region: Medial infrapatellar Knee Region Level: Knee Joint Laterality: Bilateral  Type: Local Anesthesia Indication(s): Analgesia         Local Anesthetic: Lidocaine 1-2% Route: Infiltration ([place]/IM) IV Access: Declined Sedation: Declined   Position: Sitting   Indications: 1. Bilateral primary osteoarthritis of knee   2. Chronic pain syndrome    Pain Score: Pre-procedure: 5 /10 Post-procedure: 5 /10   Pre-op H&P Assessment:  [name] is a 65 y.o. (year old), male patient, seen today for interventional treatment. He  has a past surgical history that includes Knee surgery; Gastric bypass (2002); prostate seeding; Carpal tunnel release (Right, 12/23/2014); Carpal tunnel release (Left, 01/06/2015); Colonoscopy with propofol (N/A, 10/08/2017); and Cardioversion (N/A, 05/15/2019). [name] has a current medication list which includes the following prescription(s): acetaminophen, amiodarone, amlodipine,  benazepril, cyanocobalamin, cyclobenzaprine, ezetimibe, furosemide, gabapentin, metoprolol succinate, mirtazapine, multivitamin with minerals, rivaroxaban, rosuvastatin, tramadol, trazodone, salonpas pain relief patch, and olopatadine hcl. His primarily concern today is the Knee Pain (bilateral)  Initial Vital Signs:  Pulse/HCG Rate: 62  Temp: (!) 97.1 F (36.2 C) Resp: 16 BP: 137/73 SpO2: 96 %  BMI: Estimated body mass index is 50.18 kg/m as calculated from the following:   Height as of this encounter: 6' (1.829 m).   Weight as of this encounter: 370 lb (167.8 kg).  Risk Assessment: Allergies: Reviewed. He has No Known Allergies.  Allergy Precautions: None required Coagulopathies: Reviewed. None identified.  Blood-thinner therapy: None at this time Active Infection(s): Reviewed. None identified. [name] is afebrile  Site Confirmation: [name] was asked to confirm the procedure and laterality before marking the site Procedure checklist: Completed Consent: Before the procedure and under the influence of no sedative(s), amnesic(s), or anxiolytics, the patient was informed of the treatment options, risks and possible complications. To fulfill our ethical and legal obligations, as recommended by the American Medical Association's Code of Ethics, I have informed the patient of my clinical impression; the nature and purpose of the treatment or procedure; the risks, benefits, and possible complications of the intervention; the alternatives, including doing nothing; the risk(s) and benefit(s) of the alternative treatment(s) or procedure(s); and the risk(s) and benefit(s) of doing nothing. The patient was provided information about the general risks and possible complications associated with the procedure. These may include, but are not limited to: failure to achieve desired goals, infection, bleeding, organ or nerve damage, allergic reactions, paralysis, and death. In addition, the patient  was informed of those risks and complications associated to the procedure, such as failure to decrease pain; infection; bleeding; organ or nerve damage with subsequent damage to sensory, motor, and/or  autonomic systems, resulting in permanent pain, numbness, and/or weakness of one or several areas of the body; allergic reactions; (i.e.: anaphylactic reaction); and/or death. Furthermore, the patient was informed of those risks and complications associated with the medications. These include, but are not limited to: allergic reactions (i.e.: anaphylactic or anaphylactoid reaction(s)); adrenal axis suppression; blood sugar elevation that in diabetics may result in ketoacidosis or comma; water retention that in patients with history of congestive heart failure may result in shortness of breath, pulmonary edema, and decompensation with resultant heart failure; weight gain; swelling or edema; medication-induced neural toxicity; particulate matter embolism and blood vessel occlusion with resultant organ, and/or nervous system infarction; and/or aseptic necrosis of one or more joints. Finally, the patient was informed that Medicine is not an exact science; therefore, there is also the possibility of unforeseen or unpredictable risks and/or possible complications that may result in a catastrophic outcome. The patient indicated having understood very clearly. We have given the patient no guarantees and we have made no promises. Enough time was given to the patient to ask questions, all of which were answered to the patient's satisfaction. [name] has indicated that he wanted to continue with the procedure. Attestation: I, the ordering provider, attest that I have discussed with the patient the benefits, risks, side-effects, alternatives, likelihood of achieving goals, and potential problems during recovery for the procedure that I have provided informed consent. Date  Time: 01/26/2020 10:26 AM  Pre-Procedure  Preparation:  Monitoring: As per clinic protocol. Respiration, ETCO2, SpO2, BP, heart rate and rhythm monitor placed and checked for adequate function Safety Precautions: Patient was assessed for positional comfort and pressure points before starting the procedure. Time-out: I initiated and conducted the "Time-out" before starting the procedure, as per protocol. The patient was asked to participate by confirming the accuracy of the "Time Out" information. Verification of the correct person, site, and procedure were performed and confirmed by me, the nursing staff, and the patient. "Time-out" conducted as per Joint Commission's Universal Protocol (UP.01.01.01). Time: 1109  Description of Procedure:          Target Area: Knee Joint Approach: Just above the Medial tibial plateau, lateral to the infrapatellar tendon. Area Prepped: Entire knee area, from the mid-thigh to the mid-shin. DuraPrep (Iodine Povacrylex [0.7% available iodine] and Isopropyl Alcohol, 74% w/w) Safety Precautions: Aspiration looking for blood return was conducted prior to all injections. At no point did we inject any substances, as a needle was being advanced. No attempts were made at seeking any paresthesias. Safe injection practices and needle disposal techniques used. Medications properly checked for expiration dates. SDV (single dose vial) medications used. Description of the Procedure: Protocol guidelines were followed. The patient was placed in position over the fluoroscopy table. The target area was identified and the area prepped in the usual manner. Skin & deeper tissues infiltrated with local anesthetic. Appropriate amount of time allowed to pass for local anesthetics to take effect. The procedure needles were then advanced to the target area. Proper needle placement secured. Negative aspiration confirmed. Solution injected in intermittent fashion, asking for systemic symptoms every 0.5cc of injectate. The needles were then  removed and the area cleansed, making sure to leave some of the prepping solution back to take advantage of its long term bactericidal properties. Vitals:   01/26/20 1030 01/26/20 1050 01/26/20 1114  BP: 137/73  119/75  Pulse: 62 (!) 59 (!) 57  Resp: 16    Temp: (!) 97.1 F (36.2 C)  TempSrc: Temporal    SpO2: 96% 98% 98%  Weight: (!) 370 lb (167.8 kg)    Height: 6' (1.829 m)      Start Time: 1109 hrs. End Time: 1111 hrs. Materials:  Needle(s) Type: Regular needle Gauge: 25G Length: 1.5-in Medication(s): Please see orders for medications and dosing details.  Imaging Guidance:          Type of Imaging Technique: None used Indication(s): N/A Exposure Time: No patient exposure Contrast: None used. Fluoroscopic Guidance: N/A Ultrasound Guidance: N/A Interpretation: N/A  Antibiotic Prophylaxis:   Anti-infectives (From admission, onward)   None     Indication(s): None identified  Post-operative Assessment:  Post-procedure Vital Signs:  Pulse/HCG Rate: (!) 57  Temp: (!) 97.1 F (36.2 C) Resp: 16 BP: 119/75 SpO2: 98 %  EBL: None  Complications: No immediate post-treatment complications observed by team, or reported by patient.  Note: The patient tolerated the entire procedure well. A repeat set of vitals were taken after the procedure and the patient was kept under observation following institutional policy, for this type of procedure. Post-procedural neurological assessment was performed, showing return to baseline, prior to discharge. The patient was provided with post-procedure discharge instructions, including a section on how to identify potential problems. Should any problems arise concerning this procedure, the patient was given instructions to immediately contact us, at any time, without hesitation. In any case, we plan to contact the patient by telephone for a follow-up status report regarding this interventional procedure.  Comments:  No additional relevant  information.  Plan of Care  Orders:  Orders Placed This Encounter  Procedures  . KNEE INJECTION    Hyalgan knee injection. Please order Hyalgan.    Standing Status:   Future    Standing Expiration Date:   02/26/2020    Scheduling Instructions:     Procedure: Intra-articular Hyalgan Knee injection            Side: Bilateral     Sedation: None     Timeframe: in 4 weeks    Order Specific Question:   Where will this procedure be performed?    Answer:   [facility]   Chronic Opioid Analgesic:  Tramadol 50 mg 4 times daily as needed, quantity 120/month; MME equals 20    Medications ordered for procedure: Meds ordered this encounter  Medications  . Sodium Hyaluronate SOSY 2 mL  . Sodium Hyaluronate SOSY 2 mL  . lidocaine (XYLOCAINE) 2 % (with pres) injection 400 mg   Medications administered: We administered Sodium Hyaluronate, Sodium Hyaluronate, and lidocaine.  See the medical record for exact dosing, route, and time of administration.  Follow-up plan:   Return in about 4 weeks (around 02/23/2020) for B/L Hyalgan #2.      s/p lesi #1 (left L4/5) on 8/17, #2 on 10/21/2018 (left L3/4), #3 on 12/09/2018 (Left L3/4); 02/24/2019: Right wrist injection and left trigger finger (middle finger) injection.,  Bilateral wrist injection 10/15/2019, bilateral knee Hyalgan No. 1 01/26/1962      Recent Visits Date Type Provider Dept  01/08/20 Office Visit Gillis Santa, MD [facility]  Showing recent visits within past 90 days and meeting all other requirements Today's Visits Date Type Provider Dept  01/26/20 Procedure visit Gillis Santa, MD [facility]  Showing today's visits and meeting all other requirements Future Appointments Date Type Provider Dept  04/06/20 Appointment Gillis Santa, MD [facility]  Showing future appointments within next 90 days and meeting all other requirements  Disposition: Discharge home  Discharge (Date  Time): 01/26/2020; 1113  hrs.   Primary Care Physician: Volney American, PA-C Location: [facility] Note by: Gillis Santa, MD Date: 01/26/2020; Time: 11:19 AM  Disclaimer:  Medicine is not an exact science. The only guarantee in medicine is that nothing is guaranteed. It is important to note that the decision to proceed with this intervention was based on the information collected from the patient. The Data and conclusions were drawn from the patient's questionnaire, the interview, and the physical examination. Because the information was provided in large part by the patient, it cannot be guaranteed that it has not been purposely or unconsciously manipulated. Every effort has been made to obtain as much relevant data as possible for this evaluation. It is important to note that the conclusions that lead to this procedure are derived in large part from the available data. Always take into account that the treatment will also be dependent on availability of resources and existing treatment guidelines, considered by other Pain Management Practitioners as being common knowledge and practice, at the time of the intervention. For Medico-Legal purposes, it is also important to point out that variation in procedural techniques and pharmacological choices are the acceptable norm. The indications, contraindications, technique, and results of the above procedure should only be interpreted and judged by a Board-Certified Interventional Pain Specialist with extensive familiarity and expertise in the same exact procedure and technique.

## 2020-01-27 ENCOUNTER — Telehealth: Payer: Self-pay

## 2020-01-27 NOTE — Telephone Encounter (Signed)
Called pp, a little sore. Instructed to call if needed.

## 2020-02-22 ENCOUNTER — Other Ambulatory Visit: Payer: Self-pay | Admitting: Nurse Practitioner

## 2020-02-22 NOTE — Telephone Encounter (Signed)
Requested medication (s) are due for refill today: Yes  Requested medication (s) are on the active medication list: Yes  Last refill:  01/05/20  Future visit scheduled: No  Notes to clinic:  See request    Requested Prescriptions  Pending Prescriptions Disp Refills   cyclobenzaprine (FLEXERIL) 10 MG tablet [Pharmacy Med Name: CYCLOBENZAPRINE 10MG  TABLETS] 60 tablet 0    Sig: TAKE 1 TABLET(10 MG) BY MOUTH THREE TIMES DAILY AS NEEDED FOR MUSCLE SPASMS      Not Delegated - Analgesics:  Muscle Relaxants Failed - 02/22/2020  9:29 AM      Failed - This refill cannot be delegated      Passed - Valid encounter within last 6 months    Recent Outpatient Visits           3 months ago Insomnia, unspecified type   [facility] Kathrine Haddock, NP   6 months ago Essential hypertension   [facility] Volney American, [place]   1 year ago Essential hypertension   [facility] Volney American, [place]   1 year ago Annual physical exam   [facility] Volney American, [place]   1 year ago Viral upper respiratory tract infection   [facility] Crissman, Jeannette How, MD       Future Appointments             In 4 days Parks Ranger, Devonne Doughty, DO [facility], [facility]

## 2020-02-23 ENCOUNTER — Ambulatory Visit
Payer: Medicare Other | Attending: Student in an Organized Health Care Education/Training Program | Admitting: Student in an Organized Health Care Education/Training Program

## 2020-02-23 ENCOUNTER — Other Ambulatory Visit: Payer: Self-pay

## 2020-02-23 ENCOUNTER — Encounter: Payer: Self-pay | Admitting: Student in an Organized Health Care Education/Training Program

## 2020-02-23 VITALS — BP 137/102 | HR 89 | Temp 97.2°F | Ht 72.0 in | Wt 370.0 lb

## 2020-02-23 DIAGNOSIS — M170 Bilateral primary osteoarthritis of knee: Secondary | ICD-10-CM | POA: Insufficient documentation

## 2020-02-23 DIAGNOSIS — G894 Chronic pain syndrome: Secondary | ICD-10-CM | POA: Diagnosis not present

## 2020-02-23 MED ORDER — LIDOCAINE HCL (PF) 2 % IJ SOLN
INTRAMUSCULAR | Status: AC
  Filled 2020-02-23: qty 10

## 2020-02-23 MED ORDER — SODIUM HYALURONATE (VISCOSUP) 20 MG/2ML IX SOSY: 2.0000 mL | PREFILLED_SYRINGE | Freq: Once | INTRA_ARTICULAR | Status: AC

## 2020-02-23 MED ORDER — LIDOCAINE HCL 2 % IJ SOLN
20.0000 mL | Freq: Once | INTRAMUSCULAR | Status: AC
  Administered 2020-02-23: 200 mg
  Filled 2020-02-23: qty 20

## 2020-02-23 NOTE — Telephone Encounter (Signed)
30 tabs given to get patient to his appointment with new provider.

## 2020-02-23 NOTE — Progress Notes (Signed)
PROVIDER NOTE: Information contained herein reflects review and annotations entered in association with encounter. Interpretation of such information and data should be left to medically-trained personnel. Information provided to patient can be located elsewhere in the medical record under "Patient Instructions". Document created using STT-dictation technology, any transcriptional errors that may result from process are unintentional.    Patient: [name]  Service Category: Procedure  Provider: Gillis Santa, MD  DOB: August 09, 1955  DOS: 02/23/2020  Location: [facility]  MRN: [id number]  Setting: Ambulatory - outpatient  Referring Provider: Volney American,*  Type: Established Patient  Specialty: Interventional Pain Management  PCP: Jon Billings, NP   Primary Reason for Visit: Interventional Pain Management Treatment. CC: Neck Pain  Procedure:          Anesthesia, Analgesia, Anxiolysis:  Type: Diagnostic Intra-Articular Hyalgan Knee Injection #2  Region: Medial infrapatellar Knee Region Level: Knee Joint Laterality: Bilateral  Type: Local Anesthesia Indication(s): Analgesia         Local Anesthetic: Lidocaine 1-2% Route: Infiltration ([place]ley/IM) IV Access: Declined Sedation: Declined   Position: Sitting   Indications: 1. Bilateral primary osteoarthritis of knee   2. Chronic pain syndrome    Pain Score: Pre-procedure: 4 /10 Post-procedure: 4 /10   Pre-op H&P Assessment:  [name] is a 65 y.o. (year old), male patient, seen today for interventional treatment. He  has a past surgical history that includes Knee surgery; Gastric bypass (2002); prostate seeding; Carpal tunnel release (Right, 12/23/2014); Carpal tunnel release (Left, 01/06/2015); Colonoscopy with propofol (N/A, 10/08/2017); and Cardioversion (N/A, 05/15/2019). [name] has a current medication list which includes the following prescription(s): acetaminophen, amiodarone, amlodipine, benazepril,  cyanocobalamin, cyclobenzaprine, ezetimibe, furosemide, gabapentin, salonpas pain relief patch, mirtazapine, multivitamin with minerals, rivaroxaban, rosuvastatin, tramadol, trazodone, metoprolol succinate, and olopatadine hcl. His primarily concern today is the Neck Pain  Initial Vital Signs:  Pulse/HCG Rate: 89  Temp: (!) 97.2 F (36.2 C) Resp:   BP: (!) 137/102 SpO2: 96 %  BMI: Estimated body mass index is 50.18 kg/m as calculated from the following:   Height as of this encounter: 6' (1.829 m).   Weight as of this encounter: 370 lb (167.8 kg).  Risk Assessment: Allergies: Reviewed. He has No Known Allergies.  Allergy Precautions: None required Coagulopathies: Reviewed. None identified.  Blood-thinner therapy: None at this time Active Infection(s): Reviewed. None identified. [name] is afebrile  Site Confirmation: [name] was asked to confirm the procedure and laterality before marking the site Procedure checklist: Completed Consent: Before the procedure and under the influence of no sedative(s), amnesic(s), or anxiolytics, the patient was informed of the treatment options, risks and possible complications. To fulfill our ethical and legal obligations, as recommended by the American Medical Association's Code of Ethics, I have informed the patient of my clinical impression; the nature and purpose of the treatment or procedure; the risks, benefits, and possible complications of the intervention; the alternatives, including doing nothing; the risk(s) and benefit(s) of the alternative treatment(s) or procedure(s); and the risk(s) and benefit(s) of doing nothing. The patient was provided information about the general risks and possible complications associated with the procedure. These may include, but are not limited to: failure to achieve desired goals, infection, bleeding, organ or nerve damage, allergic reactions, paralysis, and death. In addition, the patient was informed of those  risks and complications associated to the procedure, such as failure to decrease pain; infection; bleeding; organ or nerve damage with subsequent damage to sensory, motor, and/or autonomic  systems, resulting in permanent pain, numbness, and/or weakness of one or several areas of the body; allergic reactions; (i.e.: anaphylactic reaction); and/or death. Furthermore, the patient was informed of those risks and complications associated with the medications. These include, but are not limited to: allergic reactions (i.e.: anaphylactic or anaphylactoid reaction(s)); adrenal axis suppression; blood sugar elevation that in diabetics may result in ketoacidosis or comma; water retention that in patients with history of congestive heart failure may result in shortness of breath, pulmonary edema, and decompensation with resultant heart failure; weight gain; swelling or edema; medication-induced neural toxicity; particulate matter embolism and blood vessel occlusion with resultant organ, and/or nervous system infarction; and/or aseptic necrosis of one or more joints. Finally, the patient was informed that Medicine is not an exact science; therefore, there is also the possibility of unforeseen or unpredictable risks and/or possible complications that may result in a catastrophic outcome. The patient indicated having understood very clearly. We have given the patient no guarantees and we have made no promises. Enough time was given to the patient to ask questions, all of which were answered to the patient's satisfaction. [name] has indicated that he wanted to continue with the procedure. Attestation: I, the ordering provider, attest that I have discussed with the patient the benefits, risks, side-effects, alternatives, likelihood of achieving goals, and potential problems during recovery for the procedure that I have provided informed consent. Date  Time: 02/23/2020 12:44 PM  Pre-Procedure Preparation:  Monitoring: As  per clinic protocol. Respiration, ETCO2, SpO2, BP, heart rate and rhythm monitor placed and checked for adequate function Safety Precautions: Patient was assessed for positional comfort and pressure points before starting the procedure. Time-out: I initiated and conducted the "Time-out" before starting the procedure, as per protocol. The patient was asked to participate by confirming the accuracy of the "Time Out" information. Verification of the correct person, site, and procedure were performed and confirmed by me, the nursing staff, and the patient. "Time-out" conducted as per Joint Commission's Universal Protocol (UP.01.01.01). Time: 1308  Description of Procedure:          Target Area: Knee Joint Approach: Just above the Medial tibial plateau, lateral to the infrapatellar tendon. Area Prepped: Entire knee area, from the mid-thigh to the mid-shin. DuraPrep (Iodine Povacrylex [0.7% available iodine] and Isopropyl Alcohol, 74% w/w) Safety Precautions: Aspiration looking for blood return was conducted prior to all injections. At no point did we inject any substances, as a needle was being advanced. No attempts were made at seeking any paresthesias. Safe injection practices and needle disposal techniques used. Medications properly checked for expiration dates. SDV (single dose vial) medications used. Description of the Procedure: Protocol guidelines were followed. The patient was placed in position over the fluoroscopy table. The target area was identified and the area prepped in the usual manner. Skin & deeper tissues infiltrated with local anesthetic. Appropriate amount of time allowed to pass for local anesthetics to take effect. The procedure needles were then advanced to the target area. Proper needle placement secured. Negative aspiration confirmed. Solution injected in intermittent fashion, asking for systemic symptoms every 0.5cc of injectate. The needles were then removed and the area cleansed,  making sure to leave some of the prepping solution back to take advantage of its long term bactericidal properties. Vitals:   02/23/20 1250  BP: (!) 137/102  Pulse: 89  Temp: (!) 97.2 F (36.2 C)  SpO2: 96%  Weight: (!) 370 lb (167.8 kg)  Height: 6' (1.829 m)  Start Time: 1308 hrs. End Time:   hrs. Materials:  Needle(s) Type: Regular needle Gauge: 25G Length: 1.5-in Medication(s): Please see orders for medications and dosing details.  Post-operative Assessment:  Post-procedure Vital Signs:  Pulse/HCG Rate: 89  Temp: (!) 97.2 F (36.2 C) Resp:   BP: (!) 137/102 SpO2: 96 %  EBL: None  Complications: No immediate post-treatment complications observed by team, or reported by patient.  Note: The patient tolerated the entire procedure well. A repeat set of vitals were taken after the procedure and the patient was kept under observation following institutional policy, for this type of procedure. Post-procedural neurological assessment was performed, showing return to baseline, prior to discharge. The patient was provided with post-procedure discharge instructions, including a section on how to identify potential problems. Should any problems arise concerning this procedure, the patient was given instructions to immediately contact us, at any time, without hesitation. In any case, we plan to contact the patient by telephone for a follow-up status report regarding this interventional procedure.  Comments:  No additional relevant information.  Plan of Care  Orders:  Orders Placed This Encounter  Procedures  . KNEE INJECTION    Hyalgan knee injection. Please order Hyalgan.    Standing Status:   Future    Standing Expiration Date:   03/22/2020    Scheduling Instructions:     Procedure: Intra-articular Hyalgan Knee injection            Side: Bilateral     Sedation: None     Timeframe: in 4 weeks    Order Specific Question:   Where will this procedure be performed?    Answer:    [facility]   Chronic Opioid Analgesic:  Tramadol 50 mg 4 times daily as needed, quantity 120/month; MME equals 20    Medications ordered for procedure: Meds ordered this encounter  Medications  . lidocaine (XYLOCAINE) 2 % (with pres) injection 400 mg  . Sodium Hyaluronate SOSY 2 mL  . Sodium Hyaluronate SOSY 2 mL    Follow-up plan:   Return in about 4 weeks (around 03/22/2020) for b/L H[place] #3.      s/p lesi #1 (left L4/5) on 8/17, #2 on 10/21/2018 (left L3/4), #3 on 12/09/2018 (Left L3/4); 02/24/2019: Right wrist injection and left trigger finger (middle finger) injection.,  Bilateral wrist injection 10/15/2019, bilateral knee Hyalgan No. 1 01/26/1962      Recent Visits Date Type Provider Dept  01/26/20 Procedure visit Gillis Santa, MD [facility]  01/08/20 Office Visit Gillis Santa, MD [facility]  Showing recent visits within past 90 days and meeting all other requirements Today's Visits Date Type Provider Dept  02/23/20 Procedure visit Gillis Santa, MD [facility]  Showing today's visits and meeting all other requirements Future Appointments Date Type Provider Dept  03/24/20 Appointment Gillis Santa, MD [facility]  04/06/20 Appointment Gillis Santa, MD [facility]  Showing future appointments within next 90 days and meeting all other requirements  Disposition: Discharge home  Discharge (Date  Time): 02/23/2020; 1315 hrs.   Primary Care Physician: Jon Billings, NP Location: [facility] Note by: Gillis Santa, MD Date: 02/23/2020; Time: 1:45 PM  Disclaimer:  Medicine is not an exact science. The only guarantee in medicine is that nothing is guaranteed. It is important to note that the decision to proceed with this intervention was based on the information collected from the patient. The Data and conclusions were drawn from the  patient's questionnaire, the interview, and the physical  examination. Because the information was provided in large part by the patient, it cannot be guaranteed that it has not been purposely or unconsciously manipulated. Every effort has been made to obtain as much relevant data as possible for this evaluation. It is important to note that the conclusions that lead to this procedure are derived in large part from the available data. Always take into account that the treatment will also be dependent on availability of resources and existing treatment guidelines, considered by other Pain Management Practitioners as being common knowledge and practice, at the time of the intervention. For Medico-Legal purposes, it is also important to point out that variation in procedural techniques and pharmacological choices are the acceptable norm. The indications, contraindications, technique, and results of the above procedure should only be interpreted and judged by a Board-Certified Interventional Pain Specialist with extensive familiarity and expertise in the same exact procedure and technique.

## 2020-02-23 NOTE — Progress Notes (Signed)
Safety precautions to be maintained throughout the outpatient stay will include: orient to surroundings, keep bed in low position, maintain call bell within reach at all times, provide assistance with transfer out of bed and ambulation.  

## 2020-02-23 NOTE — Patient Instructions (Signed)

## 2020-02-23 NOTE — Telephone Encounter (Signed)
Routing to provider  

## 2020-02-24 ENCOUNTER — Telehealth: Payer: Self-pay | Admitting: *Deleted

## 2020-02-24 NOTE — Telephone Encounter (Signed)
No problems post procedure. 

## 2020-02-26 ENCOUNTER — Ambulatory Visit (INDEPENDENT_AMBULATORY_CARE_PROVIDER_SITE_OTHER): Payer: 59 | Admitting: Family Medicine

## 2020-02-26 ENCOUNTER — Encounter: Payer: Self-pay | Admitting: Family Medicine

## 2020-02-26 ENCOUNTER — Other Ambulatory Visit: Payer: Self-pay | Admitting: Family Medicine

## 2020-02-26 ENCOUNTER — Other Ambulatory Visit: Payer: Self-pay

## 2020-02-26 VITALS — BP 133/72 | HR 65 | Temp 97.3°F | Ht 72.0 in | Wt 373.0 lb

## 2020-02-26 DIAGNOSIS — G4700 Insomnia, unspecified: Secondary | ICD-10-CM

## 2020-02-26 DIAGNOSIS — I480 Paroxysmal atrial fibrillation: Secondary | ICD-10-CM

## 2020-02-26 DIAGNOSIS — Z6841 Body Mass Index (BMI) 40.0 and over, adult: Secondary | ICD-10-CM

## 2020-02-26 DIAGNOSIS — I10 Essential (primary) hypertension: Secondary | ICD-10-CM

## 2020-02-26 DIAGNOSIS — E782 Mixed hyperlipidemia: Secondary | ICD-10-CM

## 2020-02-26 DIAGNOSIS — D510 Vitamin B12 deficiency anemia due to intrinsic factor deficiency: Secondary | ICD-10-CM

## 2020-02-26 DIAGNOSIS — H6981 Other specified disorders of Eustachian tube, right ear: Secondary | ICD-10-CM | POA: Diagnosis not present

## 2020-02-26 DIAGNOSIS — Z0000 Encounter for general adult medical examination without abnormal findings: Secondary | ICD-10-CM

## 2020-02-26 DIAGNOSIS — G894 Chronic pain syndrome: Secondary | ICD-10-CM

## 2020-02-26 DIAGNOSIS — Z8546 Personal history of malignant neoplasm of prostate: Secondary | ICD-10-CM

## 2020-02-26 DIAGNOSIS — R7309 Other abnormal glucose: Secondary | ICD-10-CM

## 2020-02-26 MED ORDER — FLUTICASONE PROPIONATE 50 MCG/ACT NA SUSP: 2.0000 | Freq: Every day | NASAL | 3 refills | Status: DC

## 2020-02-26 NOTE — Patient Instructions (Addendum)
Thank you for coming to the office today.  You have some Eustachian Tube Dysfunction, this problem is usually caused by some deeper sinus swelling and pressure, causing difficulty of eustachian tubes to clear fluid from behind ear drum. You can have ear pain, pressure, fullness, loss of hearing. Often related to sinus symptoms and sometimes with sinusitis or infection or allergy symptoms.  Treatment: - Start using nasal steroid spray, Flonase 2 sprays in each nostril every day for at least 4-6 weeks, and maybe longer - MAY Start OTC allergy medicine (Claritin, Zyrtec, or Allegra - or generics) once daily - AVOID Afrin nasal spray, if you use this do not use more than 3 days or can make sinus swelling worse  Look into "Galbreath Technique" for ear effusion, can do this simple massage 1-2 x daily for few days then stop. And use as needed. May also use a warm moist wash cloth to help loosen up this tissue before or after doing this to help open up eustachian tube.  If any significant worsening, loss of hearing, constant pain, fever/chills, or concern for infection - notify office and we can send in an antibiotic. Or if just persistent pressure that is not improving, you may contact me back within 1 week and we can consider a brief course of oral steroid prednisone for 3 days only to help reduce swelling, as discussed this is not ideal treatment and can cause side effects.   DUE for FASTING BLOOD WORK (no food or drink after midnight before the lab appointment, only water or coffee without cream/sugar on the morning of)  SCHEDULE "Lab Only" visit in the morning at the clinic for lab draw in 5 MONTHS   - Make sure Lab Only appointment is at about 1 week before your next appointment, so that results will be available  For Lab Results, once available within 2-3 days of blood draw, you can can log in to MyChart online to view your results and a brief explanation. Also, we can discuss results at next  follow-up visit.   Please schedule a Follow-up Appointment to: Return in about 5 months (around 07/25/2020) for 5 month fasting lab only then 1 week later Annual Physical.  If you have any other questions or concerns, please feel free to call the office or send a message through [place]. You may also schedule an earlier appointment if necessary.  Additionally, you may be receiving a survey about your experience at our office within a few days to 1 week by e-mail or mail. We value your feedback.  Nobie Putnam, DO [place]

## 2020-02-26 NOTE — Progress Notes (Signed)
Subjective:    Patient ID: [name], male    DOB: 1955/06/26, 65 y.o.   MRN: [id number]  [name] is a 65 y.o. male presenting on 02/26/2020 for Establish Care and eustachian tube dysfunction, chronic pain, history prostate    HPI   Right Ear Reports R ear with some fluid build up or pressure behind R ear, seems some improvement. He admits some mild blood tinged mucus when blowing nose. Not using nasal spray. He normally wears hearing aid Denies any hearing loss changes, sinus pain or pressure  Morbid Obesity BMI >50 Hypertension Paroxsymal Atrial Fibrillation Hyperlipidemia Followed by Dr Rockey Situ, [facility] He has goals to lose weight. 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  History of Prostate Cancer Previously seen by Dr Maryan Puls [facility] - treated approx 2017 by seed implant, has had negative PSA since that time and done well, no longer followed by Urology. Last PSA < 0.1 x 2 results 07/2018 and 07/2019   Health Maintenance: Last Colonoscopy 10/12/17 Dr Sherri Sear - multiple polyp, repeat in 5 years, approx 2024  Depression screen [facility] 2/9 01/08/2020 09/23/2019 08/01/2019  Decreased Interest 0 0 0  Down, Depressed, Hopeless 0 0 0  PHQ - 2 Score 0 0 0  Altered sleeping - - 3  Tired, decreased energy - - 1  Change in appetite - - 0  Feeling bad or failure about yourself  - - 0  Trouble concentrating - - 0  Moving slowly or fidgety/restless - - 0  Suicidal thoughts - - 0  PHQ-9 Score - - 4    Past Medical History:  Diagnosis Date  . Cardiomyopathy (in setting of Afib)    a. 12/2013 Echo: EF 45-50%, mild ant and antsept HK. mild MR. Mod dil LA. nl RV fxn. Rhythm was Afib.  Marland Kitchen  DJD (degenerative joint disease) of knee   . History of kidney stones   . Intervertebral disc disorder with radiculopathy of lumbosacral region   . Kidney stone 2012  . Lower extremity edema   . PAF (paroxysmal atrial fibrillation) ([place])   . Pernicious anemia   . Prostate cancer ([place])   . Pulmonary nodule, right    a. 10/2014 Cardiac CTA: 46mm RLL nodule; b. 04/2015 CT Chest: stable 40mm RLL nodule. No new nodules; 02/2017 CTA Chest: stable, benign, 33mm RLL pulm nodule.  . SVT (supraventricular tachycardia) ([place])    Past Surgical History:  Procedure Laterality Date  . CARDIOVERSION N/A 05/15/2019   Procedure: CARDIOVERSION;  Surgeon: Minna Merritts, MD;  Location: [facility] ORS;  Service: Cardiovascular;  Laterality: N/A;  . CARPAL TUNNEL RELEASE Right 12/23/2014   Procedure: CARPAL TUNNEL RELEASE;  Surgeon: Earnestine Leys, MD;  Location: [facility] ORS;  Service: Orthopedics;  Laterality: Right;  . CARPAL TUNNEL RELEASE Left 01/06/2015   Procedure: CARPAL TUNNEL RELEASE;  Surgeon: Earnestine Leys, MD;  Location: [facility] ORS;  Service: Orthopedics;  Laterality: Left;  . COLONOSCOPY WITH PROPOFOL N/A 10/08/2017   Procedure: COLONOSCOPY WITH PROPOFOL;  Surgeon: Lin Landsman, MD;  Location: [facility] ENDOSCOPY;  Service: Gastroenterology;  Laterality: N/A;  . GASTRIC BYPASS  2002  . KNEE SURGERY     knee trauma  . prostate seeding  Social History   Socioeconomic History  . Marital status: Married    Spouse name: Not on file  . Number of children: Not on file  . Years of education: Not on file  . Highest education level: Not on file  Occupational History  . Not on file  Tobacco Use  . Smoking status: Former Smoker    Packs/day: 1.00    Years: 10.00    Pack years: 10.00    Types: Cigarettes    Quit date: 02/18/1979    Years since quitting: 41.0  . Smokeless tobacco: Former Systems developer    Types: Snuff  Vaping Use  . Vaping Use: Never used  Substance and Sexual Activity  . Alcohol use: Yes     Alcohol/week: 12.0 standard drinks    Types: 12 Cans of beer per week    Comment: weekly  . Drug use: No  . Sexual activity: Not on file  Other Topics Concern  . Not on file  Social History Narrative  . Not on file   Social Determinants of Health   Financial Resource Strain: Not on file  Food Insecurity: Not on file  Transportation Needs: Not on file  Physical Activity: Not on file  Stress: Not on file  Social Connections: Not on file  Intimate Partner Violence: Not on file   Family History  Problem Relation Age of Onset  . Heart disease Father   . Hypertension Father   . Stroke Father   . Cancer Father   . Arrhythmia Sister        A-fib  . Arrhythmia Brother        A-fib   Current Outpatient Medications on File Prior to Visit  Medication Sig  . acetaminophen (TYLENOL) 500 MG tablet Take 1,000 mg by mouth every 6 (six) hours as needed for moderate pain or headache.  Marland Kitchen amiodarone (PACERONE) 200 MG tablet Take 1 tablet (200 mg total) by mouth daily.  Marland Kitchen amLODipine (NORVASC) 5 MG tablet Take 1 tablet (5 mg total) by mouth daily.  . benazepril (LOTENSIN) 40 MG tablet TAKE 1 TABLET(40 MG) BY MOUTH DAILY  . cyanocobalamin (,VITAMIN B-12,) 1000 MCG/ML injection INJECT 1 ML IN THE MUSCLE EVERY 30 DAYS  . cyclobenzaprine (FLEXERIL) 10 MG tablet TAKE 1 TABLET(10 MG) BY MOUTH THREE TIMES DAILY AS NEEDED FOR MUSCLE SPASMS  . ezetimibe (ZETIA) 10 MG tablet TAKE 1 TABLET(10 MG) BY MOUTH DAILY (Patient taking differently: Take 10 mg by mouth daily.)  . furosemide (LASIX) 20 MG tablet Take 1 tablet (20 mg total) by mouth daily as needed.  . gabapentin (NEURONTIN) 400 MG capsule Take 1 capsule (400 mg total) by mouth 3 (three) times daily. (Patient taking differently: Take 400 mg by mouth 2 (two) times daily.)  . Menthol-Methyl Salicylate (SALONPAS PAIN RELIEF PATCH) PTCH Apply 1 patch topically daily as needed (pain).  . mirtazapine (REMERON) 15 MG tablet Take 1 tab nightly as needed for  sleep  . Multiple Vitamin (MULTIVITAMIN WITH MINERALS) TABS tablet Take 1 tablet by mouth daily.  . OLOPATADINE HCL OP Place 1 drop into both eyes daily as needed (allergies).  . rivaroxaban (XARELTO) 20 MG TABS tablet TAKE 1 TABLET(20 MG) BY MOUTH DAILY (Patient taking differently: Take 20 mg by mouth daily.)  . rosuvastatin (CRESTOR) 10 MG tablet TAKE 1 TABLET(10 MG) BY MOUTH AT BEDTIME  . traMADol (ULTRAM) 50 MG tablet Take 1 tablet (50 mg total) by mouth every 6 (six) hours as needed.  . traZODone (  DESYREL) 100 MG tablet TAKE 1 TABLET(100 MG) BY MOUTH AT BEDTIME  . metoprolol succinate (TOPROL-XL) 50 MG 24 hr tablet Take 1 tablet (50 mg total) by mouth daily. Take with or immediately following a meal.   No current facility-administered medications on file prior to visit.    Review of Systems Per HPI unless specifically indicated above      Objective:    BP 133/72   Pulse 65   Temp (!) 97.3 F (36.3 C) (Temporal)   Ht 6' (1.829 m)   Wt (!) 373 lb (169.2 kg)   SpO2 95%   BMI 50.59 kg/m   Wt Readings from Last 3 Encounters:  02/26/20 (!) 373 lb (169.2 kg)  02/23/20 (!) 370 lb (167.8 kg)  01/26/20 (!) 370 lb (167.8 kg)    Physical Exam Vitals and nursing note reviewed.  Constitutional:      General: He is not in acute distress.    Appearance: He is well-developed and well-nourished. He is obese. He is not diaphoretic.     Comments: Well-appearing, comfortable, cooperative  HENT:     Head: Normocephalic and atraumatic.     Mouth/Throat:     Mouth: Oropharynx is clear and moist.  Eyes:     General:        Right eye: No discharge.        Left eye: No discharge.     Conjunctiva/sclera: Conjunctivae normal.  Cardiovascular:     Rate and Rhythm: Normal rate.  Pulmonary:     Effort: Pulmonary effort is normal.  Musculoskeletal:        General: No edema.  Skin:    General: Skin is warm and dry.     Findings: No erythema or rash.  Neurological:     Mental Status: He  is alert and oriented to person, place, and time.  Psychiatric:        Mood and Affect: Mood and affect normal.        Behavior: Behavior normal.     Comments: Well groomed, good eye contact, normal speech and thoughts        Results for orders placed or performed in visit on 08/01/19  CBC with Differential/Platelet  Result Value Ref Range   WBC 9.8 3.4 - 10.8 x10E3/uL   RBC 5.25 4.14 - 5.80 x10E6/uL   Hemoglobin 13.4 13.0 - 17.7 g/dL   Hematocrit 42.9 37.5 - 51.0 %   MCV 82 79 - 97 fL   MCH 25.5 (L) 26.6 - 33.0 pg   MCHC 31.2 (L) 31.5 - 35.7 g/dL   RDW 15.1 11.6 - 15.4 %   Platelets 223 150 - 450 x10E3/uL   Neutrophils 46 Not Estab. %   Lymphs 39 Not Estab. %   Monocytes 13 Not Estab. %   Eos 1 Not Estab. %   Basos 1 Not Estab. %   Neutrophils Absolute 4.5 1.4 - 7.0 x10E3/uL   Lymphocytes Absolute 3.8 (H) 0.7 - 3.1 x10E3/uL   Monocytes Absolute 1.3 (H) 0.1 - 0.9 x10E3/uL   EOS (ABSOLUTE) 0.1 0.0 - 0.4 x10E3/uL   Basophils Absolute 0.1 0.0 - 0.2 x10E3/uL   Immature Granulocytes 0 Not Estab. %   Immature Grans (Abs) 0.0 0.0 - 0.1 x10E3/uL  Comprehensive metabolic panel  Result Value Ref Range   Glucose 80 65 - 99 mg/dL   BUN 16 8 - 27 mg/dL   Creatinine, Ser 1.07 0.76 - 1.27 mg/dL   GFR calc non Af Wyvonnia Lora  72 >59 mL/min/1.73   GFR calc Af Amer 84 >59 mL/min/1.73   BUN/Creatinine Ratio 15 10 - 24   Sodium 141 134 - 144 mmol/L   Potassium 4.5 3.5 - 5.2 mmol/L   Chloride 106 96 - 106 mmol/L   CO2 23 20 - 29 mmol/L   Calcium 9.2 8.6 - 10.2 mg/dL   Total Protein 6.7 6.0 - 8.5 g/dL   Albumin 4.6 3.8 - 4.8 g/dL   Globulin, Total 2.1 1.5 - 4.5 g/dL   Albumin/Globulin Ratio 2.2 1.2 - 2.2   Bilirubin Total 0.3 0.0 - 1.2 mg/dL   Alkaline Phosphatase 72 48 - 121 IU/L   AST 20 0 - 40 IU/L   ALT 19 0 - 44 IU/L  TSH  Result Value Ref Range   TSH 2.170 0.450 - 4.500 uIU/mL  UA/M w/rflx Culture, Routine   Specimen: Urine   Urine  Result Value Ref Range   Specific Gravity, UA  1.025 1.005 - 1.030   pH, UA 5.0 5.0 - 7.5   Color, UA Yellow Yellow   Appearance Ur Clear Clear   Leukocytes,UA Negative Negative   Protein,UA Negative Negative/Trace   Glucose, UA Negative Negative   Ketones, UA Negative Negative   RBC, UA Negative Negative   Bilirubin, UA Negative Negative   Urobilinogen, Ur 0.2 0.2 - 1.0 mg/dL   Nitrite, UA Negative Negative  PSA  Result Value Ref Range   Prostate Specific Ag, Serum <0.1 0.0 - 4.0 ng/mL  Lipid Panel w/o Chol/HDL Ratio  Result Value Ref Range   Cholesterol, Total 158 100 - 199 mg/dL   Triglycerides 123 0 - 149 mg/dL   HDL 73 >39 mg/dL   VLDL Cholesterol Cal 21 5 - 40 mg/dL   LDL Chol Calc (NIH) 64 0 - 99 mg/dL      Assessment & Plan:   Problem List Items Addressed This Visit    Paroxysmal atrial fibrillation (HCC)   Morbid obesity with BMI of 50.0-59.9, adult ([place])    Other Visit Diagnoses    Acute dysfunction of right eustachian tube    -  Primary   Relevant Medications   fluticasone (FLONASE) 50 MCG/ACT nasal spray      Acute R eustachian tube dysfunction with secondary R ear effusion, without loss of hearing or evidence of AOM or sinusitis. Likely related allergic rhinosinusitis based on exam and history.  Plan: 1. Start Flonase 2 sprays each nare daily for up to 4-6 weeks or longer 2. May trial anti histamine OTC AVS information given Follow-up if not improved or worsening, return criteria   Meds ordered this encounter  Medications  . fluticasone (FLONASE) 50 MCG/ACT nasal spray    Sig: Place 2 sprays into both nostrils daily. Use for 4-6 weeks then stop and use seasonally or as needed.    Dispense:  16 g    Refill:  3    Follow up plan: Return in about 5 months (around 07/25/2020) for 5 month fasting lab only then 1 week later Annual Physical.  Future 07/21/20 CMET, CBC, Lipid, A1c, PSA, TSH  Nobie Putnam, DO [facility] 02/26/2020, 10:21 AM

## 2020-03-21 ENCOUNTER — Other Ambulatory Visit: Payer: Self-pay | Admitting: Cardiovascular Disease

## 2020-03-24 ENCOUNTER — Encounter: Payer: Self-pay | Admitting: Student in an Organized Health Care Education/Training Program

## 2020-03-24 ENCOUNTER — Ambulatory Visit
Payer: Medicare Other | Attending: Student in an Organized Health Care Education/Training Program | Admitting: Student in an Organized Health Care Education/Training Program

## 2020-03-24 ENCOUNTER — Other Ambulatory Visit: Payer: Self-pay

## 2020-03-24 VITALS — BP 147/81 | HR 66 | Temp 97.2°F | Resp 16 | Ht 72.0 in | Wt 370.0 lb

## 2020-03-24 DIAGNOSIS — M170 Bilateral primary osteoarthritis of knee: Secondary | ICD-10-CM | POA: Insufficient documentation

## 2020-03-24 DIAGNOSIS — G894 Chronic pain syndrome: Secondary | ICD-10-CM

## 2020-03-24 MED ORDER — LIDOCAINE HCL 2 % IJ SOLN
20.0000 mL | Freq: Once | INTRAMUSCULAR | Status: AC
  Administered 2020-03-24: 200 mg

## 2020-03-24 MED ORDER — SODIUM HYALURONATE (VISCOSUP) 20 MG/2ML IX SOSY: 2.0000 mL | PREFILLED_SYRINGE | Freq: Once | INTRA_ARTICULAR | Status: AC

## 2020-03-24 MED ORDER — LIDOCAINE HCL (PF) 2 % IJ SOLN
INTRAMUSCULAR | Status: AC
  Filled 2020-03-24: qty 10

## 2020-03-24 NOTE — Progress Notes (Signed)
PROVIDER NOTE: Information contained herein reflects review and annotations entered in association with encounter. Interpretation of such information and data should be left to medically-trained personnel. Information provided to patient can be located elsewhere in the medical record under "Patient Instructions". Document created using STT-dictation technology, any transcriptional errors that may result from process are unintentional.    Patient: [name]  Service Category: Procedure  Provider: Gillis Santa, MD  DOB: 26-Mar-1955  DOS: 03/24/2020  Location: [facility]  MRN: [id number]  Setting: Ambulatory - outpatient  Referring Provider: Jon Billings, NP  Type: Established Patient  Specialty: Interventional Pain Management  PCP: Olin Hauser, DO   Primary Reason for Visit: Interventional Pain Management Treatment. CC: Knee Pain (bilateral)  Procedure:          Anesthesia, Analgesia, Anxiolysis:  Type: Therapeutic Intra-Articular Hyalgan Knee Injection #3  Region: Medial infrapatellar Knee Region Level: Knee Joint Laterality: Bilateral  Type: Local Anesthesia Indication(s): Analgesia         Local Anesthetic: Lidocaine 1-2% Route: Infiltration (Cross Anchor/IM) IV Access: Declined Sedation: Declined   Position: Sitting   Indications: 1. Bilateral primary osteoarthritis of knee   2. Chronic pain syndrome    Pain Score: Pre-procedure: 3 /10 Post-procedure: 2 /10   Pre-op H&P Assessment:  [name] is a 66 y.o. (year old), male patient, seen today for interventional treatment. He  has a past surgical history that includes Knee surgery; Gastric bypass (2002); prostate seeding; Carpal tunnel release (Right, 12/23/2014); Carpal tunnel release (Left, 01/06/2015); Colonoscopy with propofol (N/A, 10/08/2017); and Cardioversion (N/A, 05/15/2019). [name] has a current medication list which includes the following prescription(s): acetaminophen, amiodarone, amlodipine,  benazepril, cyanocobalamin, cyclobenzaprine, ezetimibe, fluticasone, furosemide, gabapentin, salonpas pain relief patch, metoprolol succinate, mirtazapine, multivitamin with minerals, olopatadine hcl, rivaroxaban, rosuvastatin, tramadol, and trazodone. His primarily concern today is the Knee Pain (bilateral)  Initial Vital Signs:  Pulse/HCG Rate: 66  Temp: (!) 97.2 F (36.2 C) Resp: 16 BP: (!) 147/81 SpO2: 96 %  BMI: Estimated body mass index is 50.18 kg/m as calculated from the following:   Height as of this encounter: 6' (1.829 m).   Weight as of this encounter: 370 lb (167.8 kg).  Risk Assessment: Allergies: Reviewed. He has No Known Allergies.  Allergy Precautions: None required Coagulopathies: Reviewed. None identified.  Blood-thinner therapy: None at this time Active Infection(s): Reviewed. None identified. [name] is afebrile  Site Confirmation: [name] was asked to confirm the procedure and laterality before marking the site Procedure checklist: Completed Consent: Before the procedure and under the influence of no sedative(s), amnesic(s), or anxiolytics, the patient was informed of the treatment options, risks and possible complications. To fulfill our ethical and legal obligations, as recommended by the American Medical Association's Code of Ethics, I have informed the patient of my clinical impression; the nature and purpose of the treatment or procedure; the risks, benefits, and possible complications of the intervention; the alternatives, including doing nothing; the risk(s) and benefit(s) of the alternative treatment(s) or procedure(s); and the risk(s) and benefit(s) of doing nothing. The patient was provided information about the general risks and possible complications associated with the procedure. These may include, but are not limited to: failure to achieve desired goals, infection, bleeding, organ or nerve damage, allergic reactions, paralysis, and death. In  addition, the patient was informed of those risks and complications associated to the procedure, such as failure to decrease pain; infection; bleeding; organ or nerve damage with subsequent damage to sensory,  motor, and/or autonomic systems, resulting in permanent pain, numbness, and/or weakness of one or several areas of the body; allergic reactions; (i.e.: anaphylactic reaction); and/or death. Furthermore, the patient was informed of those risks and complications associated with the medications. These include, but are not limited to: allergic reactions (i.e.: anaphylactic or anaphylactoid reaction(s)); adrenal axis suppression; blood sugar elevation that in diabetics may result in ketoacidosis or comma; water retention that in patients with history of congestive heart failure may result in shortness of breath, pulmonary edema, and decompensation with resultant heart failure; weight gain; swelling or edema; medication-induced neural toxicity; particulate matter embolism and blood vessel occlusion with resultant organ, and/or nervous system infarction; and/or aseptic necrosis of one or more joints. Finally, the patient was informed that Medicine is not an exact science; therefore, there is also the possibility of unforeseen or unpredictable risks and/or possible complications that may result in a catastrophic outcome. The patient indicated having understood very clearly. We have given the patient no guarantees and we have made no promises. Enough time was given to the patient to ask questions, all of which were answered to the patient's satisfaction. [name] has indicated that he wanted to continue with the procedure. Attestation: I, the ordering provider, attest that I have discussed with the patient the benefits, risks, side-effects, alternatives, likelihood of achieving goals, and potential problems during recovery for the procedure that I have provided informed consent. Date  Time: 03/24/2020  9:43  AM  Pre-Procedure Preparation:  Monitoring: As per clinic protocol. Respiration, ETCO2, SpO2, BP, heart rate and rhythm monitor placed and checked for adequate function Safety Precautions: Patient was assessed for positional comfort and pressure points before starting the procedure. Time-out: I initiated and conducted the "Time-out" before starting the procedure, as per protocol. The patient was asked to participate by confirming the accuracy of the "Time Out" information. Verification of the correct person, site, and procedure were performed and confirmed by me, the nursing staff, and the patient. "Time-out" conducted as per Joint Commission's Universal Protocol (UP.01.01.01). Time: 1003  Description of Procedure:          Target Area: Knee Joint Approach: Just above the Medial tibial plateau, lateral to the infrapatellar tendon. Area Prepped: Entire knee area, from the mid-thigh to the mid-shin. DuraPrep (Iodine Povacrylex [0.7% available iodine] and Isopropyl Alcohol, 74% w/w) Safety Precautions: Aspiration looking for blood return was conducted prior to all injections. At no point did we inject any substances, as a needle was being advanced. No attempts were made at seeking any paresthesias. Safe injection practices and needle disposal techniques used. Medications properly checked for expiration dates. SDV (single dose vial) medications used. Description of the Procedure: Protocol guidelines were followed. The patient was placed in position over the fluoroscopy table. The target area was identified and the area prepped in the usual manner. Skin & deeper tissues infiltrated with local anesthetic. Appropriate amount of time allowed to pass for local anesthetics to take effect. The procedure needles were then advanced to the target area. Proper needle placement secured. Negative aspiration confirmed. Solution injected in intermittent fashion, asking for systemic symptoms every 0.5cc of injectate. The  needles were then removed and the area cleansed, making sure to leave some of the prepping solution back to take advantage of its long term bactericidal properties. Vitals:   03/24/20 0944  BP: (!) 147/81  Pulse: 66  Resp: 16  Temp: (!) 97.2 F (36.2 C)  TempSrc: Temporal  SpO2: 96%  Weight: (!) 370  lb (167.8 kg)  Height: 6' (1.829 m)    Start Time: 1003 hrs. End Time:   hrs. Materials:  Needle(s) Type: Regular needle Gauge: 25G Length: 1.5-in Medication(s): Please see orders for medications and dosing details.  Post-operative Assessment:  Post-procedure Vital Signs:  Pulse/HCG Rate: 66  Temp: (!) 97.2 F (36.2 C) Resp: 16 BP: (!) 147/81 SpO2: 96 %  EBL: None  Complications: No immediate post-treatment complications observed by team, or reported by patient.  Note: The patient tolerated the entire procedure well. A repeat set of vitals were taken after the procedure and the patient was kept under observation following institutional policy, for this type of procedure. Post-procedural neurological assessment was performed, showing return to baseline, prior to discharge. The patient was provided with post-procedure discharge instructions, including a section on how to identify potential problems. Should any problems arise concerning this procedure, the patient was given instructions to immediately contact us, at any time, without hesitation. In any case, we plan to contact the patient by telephone for a follow-up status report regarding this interventional procedure.  Comments:  No additional relevant information.  Plan of Care  Orders:  Orders Placed This Encounter  Procedures  . KNEE INJECTION    Hyalgan knee injection. Please order Hyalgan.    Standing Status:   Future    Standing Expiration Date:   04/24/2020    Scheduling Instructions:     Procedure: Intra-articular Hyalgan Knee injection     #4       Side: Bilateral     Sedation: None     Timeframe: in8 weeks     Order Specific Question:   Where will this procedure be performed?    Answer:   [facility]   Chronic Opioid Analgesic:  Tramadol 50 mg 4 times daily as needed, quantity 120/month; MME equals 20    Medications ordered for procedure: Meds ordered this encounter  Medications  . lidocaine (XYLOCAINE) 2 % (with pres) injection 400 mg  . Sodium Hyaluronate SOSY 2 mL  . Sodium Hyaluronate SOSY 2 mL    Follow-up plan:   Return in 8 weeks (on 05/19/2020) for B/L Hyalagan #4.      s/p lesi #1 (left L4/5) on 8/17, #2 on 10/21/2018 (left L3/4), #3 on 12/09/2018 (Left L3/4); 02/24/2019: Right wrist injection and left trigger finger (middle finger) injection.,  Bilateral wrist injection 10/15/2019, bilateral knee Hyalgan No. 1 01/26/1960. #2 02/23/2020, #3 03/24/2020.  Endorsing approximately 75 to 80% pain relief for bilateral knee pain.     Recent Visits Date Type Provider Dept  02/23/20 Procedure visit Gillis Santa, MD [facility]  01/26/20 Procedure visit Gillis Santa, MD [facility]  01/08/20 Office Visit Gillis Santa, MD [facility]  Showing recent visits within past 90 days and meeting all other requirements Today's Visits Date Type Provider Dept  03/24/20 Procedure visit Gillis Santa, MD [facility]  Showing today's visits and meeting all other requirements Future Appointments Date Type Provider Dept  04/06/20 Appointment Gillis Santa, MD [facility]  05/19/20 Appointment Gillis Santa, MD [facility]  Showing future appointments within next 90 days and meeting all other requirements  Disposition: Discharge home  Discharge (Date  Time): 03/24/2020; 1009 hrs.   Primary Care Physician: Olin Hauser, DO Location: [facility] Note by: Gillis Santa, MD Date: 03/24/2020; Time: 10:57 AM  Disclaimer:  Medicine is not an exact science. The only guarantee in medicine is  that nothing is  guaranteed. It is important to note that the decision to proceed with this intervention was based on the information collected from the patient. The Data and conclusions were drawn from the patient's questionnaire, the interview, and the physical examination. Because the information was provided in large part by the patient, it cannot be guaranteed that it has not been purposely or unconsciously manipulated. Every effort has been made to obtain as much relevant data as possible for this evaluation. It is important to note that the conclusions that lead to this procedure are derived in large part from the available data. Always take into account that the treatment will also be dependent on availability of resources and existing treatment guidelines, considered by other Pain Management Practitioners as being common knowledge and practice, at the time of the intervention. For Medico-Legal purposes, it is also important to point out that variation in procedural techniques and pharmacological choices are the acceptable norm. The indications, contraindications, technique, and results of the above procedure should only be interpreted and judged by a Board-Certified Interventional Pain Specialist with extensive familiarity and expertise in the same exact procedure and technique.

## 2020-03-24 NOTE — Progress Notes (Signed)
Safety precautions to be maintained throughout the outpatient stay will include: orient to surroundings, keep bed in low position, maintain call bell within reach at all times, provide assistance with transfer out of bed and ambulation.  

## 2020-03-25 ENCOUNTER — Telehealth: Payer: Self-pay | Admitting: *Deleted

## 2020-03-25 NOTE — Telephone Encounter (Signed)
Spoke with patient re; procedure on yesterday, no questions or concerns.  

## 2020-04-04 ENCOUNTER — Other Ambulatory Visit: Payer: Self-pay | Admitting: Family Medicine

## 2020-04-04 ENCOUNTER — Other Ambulatory Visit: Payer: Self-pay | Admitting: Nurse Practitioner

## 2020-04-05 ENCOUNTER — Other Ambulatory Visit: Payer: Self-pay | Admitting: Family Medicine

## 2020-04-05 DIAGNOSIS — G4700 Insomnia, unspecified: Secondary | ICD-10-CM

## 2020-04-05 NOTE — Telephone Encounter (Signed)
Medication Refill - Medication: traZODone (DESYREL) 100 MG tablet    Has the patient contacted their pharmacy? Yes.   (Agent: If no, request that the patient contact the pharmacy for the refill.) (Agent: If yes, when and what did the pharmacy advise?)Pharmacy has been sending request to old PCP at [facility] practice/ Pt is now at [place] with Dr. Raliegh Ip  Preferred Pharmacy (with phone number or street name):  [id number], [place] [address] AT [place] Phone:  [phone]  Fax:  [phone]       Agent: Please be advised that RX refills may take up to 3 business days. We ask that you follow-up with your pharmacy.

## 2020-04-05 NOTE — Addendum Note (Signed)
Addended by: Linus Orn A on: 04/05/2020 03:39 PM   Modules accepted: Orders

## 2020-04-05 NOTE — Telephone Encounter (Signed)
  Notes to clinic: medication filled by a different provider  Review for refill    Requested Prescriptions  Pending Prescriptions Disp Refills   traZODone (DESYREL) 100 MG tablet [Pharmacy Med Name: TRAZODONE 100MG  TABLETS] 90 tablet 0    Sig: TAKE 1 TABLET(100 MG) BY MOUTH AT BEDTIME      Psychiatry: Antidepressants - Serotonin Modulator Passed - 04/04/2020  9:28 PM      Passed - Valid encounter within last 6 months    Recent Outpatient Visits           1 month ago Acute dysfunction of right eustachian tube   [place], DO   5 months ago Insomnia, unspecified type   [facility] Kathrine Haddock, NP   8 months ago Essential hypertension   [facility] Volney American, [place]   1 year ago Essential hypertension   [facility] Volney American, [place]   1 year ago Annual physical exam   [facility] Volney American, [place]       Future Appointments             In 3 months Parks Ranger, Devonne Doughty, [facility], [facility]

## 2020-04-05 NOTE — Telephone Encounter (Signed)
Notes to clinic:  medication last filled by a different provider  Review for refill    Requested Prescriptions  Pending Prescriptions Disp Refills   mirtazapine (REMERON) 15 MG tablet [Pharmacy Med Name: MIRTAZAPINE 15MG  TABLETS] 90 tablet 1    Sig: TAKE 1 TABLET BY MOUTH EVERY NIGHT AS NEEDED FOR SLEEP      Psychiatry: Antidepressants - mirtazapine Passed - 04/04/2020  9:30 PM      Passed - AST in normal range and within 360 days    AST  Date Value Ref Range Status  08/01/2019 20 0 - 40 IU/L Final   AST (SGOT) Piccolo, Waived  Date Value Ref Range Status  12/04/2016 26 11 - 38 U/L Final          Passed - ALT in normal range and within 360 days    ALT  Date Value Ref Range Status  08/01/2019 19 0 - 44 IU/L Final   ALT (SGPT) Piccolo, Waived  Date Value Ref Range Status  12/04/2016 16 10 - 47 U/L Final          Passed - Triglycerides in normal range and within 360 days    Triglycerides  Date Value Ref Range Status  08/01/2019 123 0 - 149 mg/dL Final   Triglycerides Piccolo,Waived  Date Value Ref Range Status  12/04/2016 147 <150 mg/dL Final    Comment:                            Normal                   <150                         Borderline High     150 - 199                         High                200 - 499                         Very High                >499           Passed - Total Cholesterol in normal range and within 360 days    Cholesterol, Total  Date Value Ref Range Status  08/01/2019 158 100 - 199 mg/dL Final   Cholesterol Piccolo, Waived  Date Value Ref Range Status  12/04/2016 195 <200 mg/dL Final    Comment:                            Desirable                <200                         Borderline High      200- 239                         High                     >239           Passed - WBC  in normal range and within 360 days    WBC  Date Value Ref Range Status  08/01/2019 9.8 3.4 - 10.8 x10E3/uL Final  05/05/2019 6.7 4.0 -  10.5 K/uL Final          Passed - Valid encounter within last 6 months    Recent Outpatient Visits           1 month ago Acute dysfunction of right eustachian tube   [place], DO   5 months ago Insomnia, unspecified type   [facility] Kathrine Haddock, NP   8 months ago Essential hypertension   [facility] Volney American, [place]   1 year ago Essential hypertension   [facility] Volney American, [place]   1 year ago Annual physical exam   [facility] Volney American, [place]       Future Appointments             In 3 months Parks Ranger, Devonne Doughty, [facility], [facility]

## 2020-04-05 NOTE — Telephone Encounter (Signed)
Pt calling again stating that he is also needing to have the mirtazapine medication sent over for him. Please advise.

## 2020-04-05 NOTE — Telephone Encounter (Signed)
No longer our patient

## 2020-04-05 NOTE — Telephone Encounter (Signed)
Requested medication (s) are due for refill today: Yes  Requested medication (s) are on the active medication list: Yes  Last refill:  01/05/20  Future visit scheduled: Yes  Notes to clinic:  Historical provider.    Requested Prescriptions  Pending Prescriptions Disp Refills   traZODone (DESYREL) 100 MG tablet 90 tablet 0    Sig: TAKE 1 TABLET(100 MG) BY MOUTH AT BEDTIME      Psychiatry: Antidepressants - Serotonin Modulator Passed - 04/05/2020  3:39 PM      Passed - Valid encounter within last 6 months    Recent Outpatient Visits           1 month ago Acute dysfunction of right eustachian tube   [place], DO   5 months ago Insomnia, unspecified type   [facility] Kathrine Haddock, NP   8 months ago Essential hypertension   [facility] Volney American, [place]   1 year ago Essential hypertension   [facility] Vol[facility], [place]   1 year ago Annual physical exam   [facility]ney American, [place]       Future Appointments             In 3 months Parks Ranger, Devonne Doughty, [facility], [place]   traZODone (DESYREL) 100 MG tablet [Pharmacy Med Name: TRAZODONE 100MG  TABLETS] 90 tablet 0    Sig: TAKE 1 TABLET(100 MG) BY MOUTH AT BEDTIME      Psychiatry: Antidepressants - Serotonin Modulator Passed - 04/05/2020  3:39 PM      Passed - Valid encounter within last 6 months    Recent Outpatient Visits           1 month ago Acute dysfunction of right eustachian tube   [place], DO   5 months ago Insomnia, unspecified type   [facility] Kathrine Haddock, NP   8 months ago Essential hypertension   [place], Lilia Argue, [place]   1 year ago Essential hypertension   [facility] Volney American,  [place]   1 year ago Annual physical exam   [facility] [place], [place]       Future Appointments             In 3 months Parks Ranger, Devonne Doughty, [facility], [facility]

## 2020-04-06 ENCOUNTER — Encounter: Payer: Self-pay | Admitting: Student in an Organized Health Care Education/Training Program

## 2020-04-06 ENCOUNTER — Other Ambulatory Visit: Payer: Self-pay

## 2020-04-06 ENCOUNTER — Ambulatory Visit
Payer: Medicare Other | Attending: Student in an Organized Health Care Education/Training Program | Admitting: Student in an Organized Health Care Education/Training Program

## 2020-04-06 VITALS — BP 147/89 | HR 65 | Temp 97.2°F | Resp 16 | Ht 72.0 in | Wt 370.0 lb

## 2020-04-06 DIAGNOSIS — M5416 Radiculopathy, lumbar region: Secondary | ICD-10-CM | POA: Diagnosis present

## 2020-04-06 DIAGNOSIS — M25561 Pain in right knee: Secondary | ICD-10-CM | POA: Diagnosis present

## 2020-04-06 DIAGNOSIS — G8929 Other chronic pain: Secondary | ICD-10-CM | POA: Diagnosis present

## 2020-04-06 DIAGNOSIS — M170 Bilateral primary osteoarthritis of knee: Secondary | ICD-10-CM | POA: Diagnosis present

## 2020-04-06 DIAGNOSIS — M25562 Pain in left knee: Secondary | ICD-10-CM | POA: Diagnosis present

## 2020-04-06 DIAGNOSIS — G894 Chronic pain syndrome: Secondary | ICD-10-CM | POA: Insufficient documentation

## 2020-04-06 MED ORDER — TRAZODONE HCL 100 MG PO TABS: ORAL_TABLET | ORAL | 1 refills | Status: DC

## 2020-04-06 MED ORDER — TRAZODONE HCL 150 MG PO TABS
150.0000 mg | ORAL_TABLET | Freq: Every day | ORAL | 1 refills | Status: DC
Start: 2020-04-06 — End: 2020-07-05

## 2020-04-06 MED ORDER — TRAMADOL HCL 50 MG PO TABS: 50.0000 mg | ORAL_TABLET | Freq: Four times a day (QID) | ORAL | 2 refills | Status: AC | PRN

## 2020-04-06 MED ORDER — GABAPENTIN 400 MG PO CAPS: 400.0000 mg | ORAL_CAPSULE | Freq: Three times a day (TID) | ORAL | 5 refills | Status: DC

## 2020-04-06 NOTE — Addendum Note (Signed)
Addended by: Olin Hauser on: 04/06/2020 09:32 AM   Modules accepted: Orders

## 2020-04-06 NOTE — Addendum Note (Signed)
Addended by: Olin Hauser on: 04/06/2020 06:03 PM   Modules accepted: Orders

## 2020-04-06 NOTE — Telephone Encounter (Signed)
Attempted to call patient but no answer, left a voicemail to call back to the office.

## 2020-04-06 NOTE — Telephone Encounter (Signed)
Called patient.  We discussed options. I advised we can try the Trazodone 150mg  nightly and discontinue Mirtazapine 15mg .  New rx sent in for 150mg  90 day supply, and DC'd order mirtazapine.  He can follow-up with me on insomnia anytime in next few weeks if not improved.  Nobie Putnam, [facility] 04/06/2020, 6:01 PM

## 2020-04-06 NOTE — Patient Instructions (Addendum)
  Stop xarelto 3 days prior to procedure  Preparing for your procedure (without sedation) Instructions: . Oral Intake: Do not eat or drink anything for at least 3 hours prior to your procedure. . Transportation: Unless otherwise stated by your physician, you may drive yourself after the procedure. . Blood Pressure Medicine: Take your blood pressure medicine with a sip of water the morning of the procedure. . Insulin: Take only  of your normal insulin dose. . Preventing infections: Shower with an antibacterial soap the morning of your procedure. . Build-up your immune system: Take 1000 mg of Vitamin C with every meal (3 times a day) the day prior to your procedure. . Pregnancy: If you are pregnant, call and cancel the procedure. . Sickness: If you have a cold, fever, or any active infections, call and cancel the procedure. . Arrival: You must be in the facility at least 30 minutes prior to your scheduled procedure. . Children: Do not bring any children with you. . Dress appropriately: Bring dark clothing that you would not mind if they get stained. . Valuables: Do not bring any jewelry or valuables. Procedure appointments are reserved for interventional treatments only. Marland Kitchen No Prescription Refills. . No medication changes will be discussed during procedure appointments. . No disability issues will be discussed. Marland Kitchen

## 2020-04-06 NOTE — Telephone Encounter (Signed)
Can you call patient to clarify if he is taking both sleeping meds?  Trazodone and Mirtazapine?  Or does he take only one on any given night he needs them?  It is duplicate therapy.  I don't believe we focused on this for his new patient appointment.  I have refilled Trazodone.  Let me know if he needs Mirtazapine as well, and I would just say to use with caution and we can discuss in future at next visit  Nobie Putnam, [facility] 04/06/2020, 9:33 AM

## 2020-04-06 NOTE — Progress Notes (Signed)
PROVIDER NOTE: Information contained herein reflects review and annotations entered in association with encounter. Interpretation of such information and data should be left to medically-trained personnel. Information provided to patient can be located elsewhere in the medical record under "Patient Instructions". Document created using STT-dictation technology, any transcriptional errors that may result from process are unintentional.    Patient: [name]  Service Category: E/M  Provider: Gillis Santa, MD  DOB: 05-05-1955  DOS: 04/06/2020  Specialty: Interventional Pain Management  MRN: [id number]  Setting: Ambulatory outpatient  PCP: Olin Hauser, DO  Type: Established Patient    Referring Provider: Volney American,*  Location: Office  Delivery: Face-to-face     HPI  [name], a 65 y.o. year old male, is here today because of his Chronic pain syndrome [G89.4]. [name] primary complain today is Back Pain (lower) Last encounter: My last encounter with him was on 03/24/2020. Pertinent problems: [name] has H/O gastric bypass; DJD (degenerative joint disease) of knee; Bilateral carpal tunnel syndrome; Paroxysmal atrial fibrillation ([place]); Chronic radicular lumbar pain; Lumbar radiculopathy; Lumbar degenerative disc disease; Lumbar facet arthropathy; Lumbar facet joint syndrome; Primary osteoarthritis of both wrists; Trigger middle finger of left hand; and Chronic pain syndrome on their pertinent problem list. Pain Assessment: Severity of Chronic pain is reported as a 7 /10. Location: Back Lower/sometimes radiates down legs to feet, right or left. Onset: More than a month ago. Quality: Aching,Burning. Timing: Constant. Modifying factor(s): meds. Vitals:  height is 6' (1.829 m) and weight is 370 lb (167.8 kg) (abnormal). His temporal temperature is 97.2 F (36.2 C) (abnormal). His blood pressure is 147/89 (abnormal) and his pulse is 65. His respiration is 16 and oxygen  saturation is 96%.   Reason for encounter: medication management.    Patient follows up today for management as well as post procedure evaluation after his 3 series of intra-articular Hyalgan injections.  Patient states that his knee pain is significantly better.  He is able to bear weight more comfortably and ambulate easily as well.  He states that he will not be going through with knee surgery as was previously recommended given the pain relief and functional benefit that he is received with Hyalgan therapy.  He has an injection coming up.  He is having an increase in his low back pain.  His previous lumbar epidural steroid injection was in July 2021 at right L3-L4.  I have placed a as needed order should he have an increase in his radicular pain.  Otherwise we will refill his tramadol as below.  No change in dose.  We will also obtain annual urine toxicology screen for medication compliance monitoring.  Pharmacotherapy Assessment   Analgesic: Tramadol 50 mg 4 times daily as needed, quantity 120/month; MME equals 20    Monitoring: [place] PMP: PDMP reviewed during this encounter.       Pharmacotherapy: No side-effects or adverse reactions reported. Compliance: No problems identified. Effectiveness: Clinically acceptable.  Rise Patience, RN  04/06/2020  8:18 AM  Sign when Signing Visit Nursing Pain Medication Assessment:  Safety precautions to be maintained throughout the outpatient stay will include: orient to surroundings, keep bed in low position, maintain call bell within reach at all times, provide assistance with transfer out of bed and ambulation.  Medication Inspection Compliance: Pill count conducted under aseptic conditions, in front of the patient. Neither the pills nor the bottle was removed from the patient's sight at any time. Once count was completed pills  were immediately returned to the patient in their original bottle.  Medication: Tramadol (Ultram) Pill/Patch Count: 21 of  120 pills remain Pill/Patch Appearance: Markings consistent with prescribed medication Bottle Appearance: Standard pharmacy container. Clearly labeled. Filled Date: 02 / 06 / 2022 Last Medication intake:  Today    UDS:  Summary  Date Value Ref Range Status  02/27/2019 Note  Final    Comment:    ==================================================================== Compliance Drug Analysis, Ur ==================================================================== Test                             Result       Flag       Units Drug Present and Declared for Prescription Verification   Tramadol                       >2463        EXPECTED   ng/mg creat   O-Desmethyltramadol            1297         EXPECTED   ng/mg creat   N-Desmethyltramadol            >2463        EXPECTED   ng/mg creat    Source of tramadol is a prescription medication. O-desmethyltramadol    and N-desmethyltramadol are expected metabolites of tramadol.   Gabapentin                     PRESENT      EXPECTED   Cyclobenzaprine                PRESENT      EXPECTED   Desmethylcyclobenzaprine       PRESENT      EXPECTED    Desmethylcyclobenzaprine is an expected metabolite of    cyclobenzaprine.   Mirtazapine                    PRESENT      EXPECTED   Trazodone                      PRESENT      EXPECTED   1,3 chlorophenyl piperazine    PRESENT      EXPECTED    1,3-chlorophenyl piperazine is an expected metabolite of trazodone.   Metoprolol                     PRESENT      EXPECTED Drug Present not Declared for Prescription Verification   Acetaminophen                  PRESENT      UNEXPECTED   Ibuprofen                      PRESENT      UNEXPECTED   Diphenhydramine                PRESENT      UNEXPECTED ==================================================================== Test                      Result    Flag   Units      Ref Range   Creatinine              203  mg/dL       >=20 ==================================================================== Declared Medications:  The flagging and interpretation on this report are based on the  following declared medications.  Unexpected results may arise from  inaccuracies in the declared medications.  **Note: The testing scope of this panel includes these medications:  Cyclobenzaprine  Gabapentin  Metoprolol  Mirtazapine  Tramadol  Trazodone  **Note: The testing scope of this panel does not include the  following reported medications:  Alendronate  Amlodipine  Benazepril  Cyanocobalamin  Ezetimibe  Rivaroxaban  Rosuvastatin ==================================================================== For clinical consultation, please call [phone]. ====================================================================      ROS  Constitutional: Denies any fever or chills Gastrointestinal: No reported hemesis, hematochezia, vomiting, or acute GI distress Musculoskeletal: Low back, left hip pain Neurological: No reported episodes of acute onset apraxia, aphasia, dysarthria, agnosia, amnesia, paralysis, loss of coordination, or loss of consciousness  Medication Review  Salonpas Pain Relief Patch, acetaminophen, amLODipine, amiodarone, benazepril, cyanocobalamin, cyclobenzaprine, ezetimibe, fluticasone, furosemide, gabapentin, metoprolol succinate, mirtazapine, multivitamin with minerals, rivaroxaban, rosuvastatin, traMADol, and traZODone  History Review  Allergy: [name] has No Known Allergies. Drug: [name]  reports no history of drug use. Alcohol:  reports current alcohol use of about 12.0 standard drinks of alcohol per week. Tobacco:  reports that he quit smoking about 41 years ago. His smoking use included cigarettes. He has a 10.00 pack-year smoking history. He has quit using smokeless tobacco.  His smokeless tobacco use included snuff. Social: [name]  reports that he quit smoking about 41 years  ago. His smoking use included cigarettes. He has a 10.00 pack-year smoking history. He has quit using smokeless tobacco.  His smokeless tobacco use included snuff. He reports current alcohol use of about 12.0 standard drinks of alcohol per week. He reports that he does not use drugs. Medical:  has a past medical history of Cardiomyopathy (in setting of Afib), DJD (degenerative joint disease) of knee, History of kidney stones, Intervertebral disc disorder with radiculopathy of lumbosacral region, Kidney stone (2012), Lower extremity edema, PAF (paroxysmal atrial fibrillation) ([place]), Pernicious anemia, Prostate cancer ([place]), Pulmonary nodule, right, and SVT (supraventricular tachycardia) ([place]). Surgical: [name]  has a past surgical history that includes Knee surgery; Gastric bypass (2002); prostate seeding; Carpal tunnel release (Right, 12/23/2014); Carpal tunnel release (Left, 01/06/2015); Colonoscopy with propofol (N/A, 10/08/2017); and Cardioversion (N/A, 05/15/2019). Family: family history includes Arrhythmia in his brother and sister; Cancer in his father; Heart disease in his father; Hypertension in his father; Stroke in his father.  Laboratory Chemistry Profile   Renal Lab Results  Component Value Date   BUN 16 08/01/2019   CREATININE 1.07 08/01/2019   BCR 15 08/01/2019   GFRAA 84 08/01/2019   GFRNONAA 72 08/01/2019     Hepatic Lab Results  Component Value Date   AST 20 08/01/2019   ALT 19 08/01/2019   ALBUMIN 4.6 08/01/2019   ALKPHOS 72 08/01/2019   LIPASE 28 03/13/2017     Electrolytes Lab Results  Component Value Date   NA 141 08/01/2019   K 4.5 08/01/2019   CL 106 08/01/2019   CALCIUM 9.2 08/01/2019   MG 2.0 05/05/2019     Bone No results found for: VD25OH, VD125OH2TOT, [id number], KD9833AS5, 25OHVITD1, 25OHVITD2, 25OHVITD3, TESTOFREE, TESTOSTERONE   Inflammation (CRP: Acute Phase) (ESR: Chronic Phase) No results found for: CRP, ESRSEDRATE, LATICACIDVEN     Note:  Above Lab results reviewed.  Physical Exam  General appearance: Well nourished, well developed, and well hydrated. In no  apparent acute distress Mental status: Alert, oriented x 3 (person, place, & time)       Respiratory: No evidence of acute respiratory distress Eyes: PERLA Vitals: BP (!) 147/89   Pulse 65   Temp (!) 97.2 F (36.2 C) (Temporal)   Resp 16   Ht 6' (1.829 m)   Wt (!) 370 lb (167.8 kg)   SpO2 96%   BMI 50.18 kg/m  BMI: Estimated body mass index is 50.18 kg/m as calculated from the following:   Height as of this encounter: 6' (1.829 m).   Weight as of this encounter: 370 lb (167.8 kg). Ideal: Ideal body weight: 77.6 kg (171 lb 1.2 oz) Adjusted ideal body weight: 113.7 kg (250 lb 10.3 oz)  Lumbar Spine Area Exam  Skin & Axial Inspection: No masses, redness, or swelling Alignment: Symmetrical Functional ROM: Pain restricted ROM affecting primarily the left Stability: No instability detected Muscle Tone/Strength: Functionally intact. No obvious neuro-muscular anomalies detected. Sensory (Neurological): Dermatomal pain pattern  Gait & Posture Assessment  Ambulation: Unassisted Gait: Modified gait pattern (slower gait speed, wider stride width, and longer stance duration) associated with morbid obesity Posture: WNL  Lower Extremity Exam    Side: Right lower extremity  Side: Left lower extremity  Stability: No instability observed          Stability: No instability observed          Skin & Extremity Inspection: Skin color, temperature, and hair growth are WNL. No peripheral edema or cyanosis. No masses, redness, swelling, asymmetry, or associated skin lesions. No contractures.  Skin & Extremity Inspection: Skin color, temperature, and hair growth are WNL. No peripheral edema or cyanosis. No masses, redness, swelling, asymmetry, or associated skin lesions. No contractures.  Functional ROM: Improved after treatment                  Functional ROM: Improved after  treatment                  Muscle Tone/Strength: Functionally intact. No obvious neuro-muscular anomalies detected.  Muscle Tone/Strength: Functionally intact. No obvious neuro-muscular anomalies detected.  Sensory (Neurological): Improved        Sensory (Neurological): Improved        DTR: Patellar: deferred today Achilles: deferred today Plantar: deferred today  DTR: Patellar: deferred today Achilles: deferred today Plantar: deferred today  Palpation: No palpable anomalies  Palpation: No palpable anomalies    Assessment   Status Diagnosis  Controlled Controlled Controlled 1. Chronic pain syndrome   2. Chronic pain of both knees   3. Bilateral primary osteoarthritis of knee   4. Chronic radicular lumbar pain       Plan of Care  [name] has a current medication list which includes the following long-term medication(s): amiodarone, amlodipine, benazepril, ezetimibe, fluticasone, furosemide, gabapentin, metoprolol succinate, mirtazapine, rivaroxaban, rosuvastatin, and trazodone.  1. Chronic pain syndrome - traMADol (ULTRAM) 50 MG tablet; Take 1 tablet (50 mg total) by mouth every 6 (six) hours as needed.  Dispense: 120 tablet; Refill: 2 - Lumbar Epidural Injection; Standing - ToxASSURE Select 13 (MW), Urine  2. Chronic pain of both knees - traMADol (ULTRAM) 50 MG tablet; Take 1 tablet (50 mg total) by mouth every 6 (six) hours as needed.  Dispense: 120 tablet; Refill: 2  3. Bilateral primary osteoarthritis of knee - traMADol (ULTRAM) 50 MG tablet; Take 1 tablet (50 mg total) by mouth every 6 (six) hours as needed.  Dispense: 120 tablet;  Refill: 2  4. Chronic radicular lumbar pain - Lumbar Epidural Injection; Standing   Pharmacotherapy (Medications Ordered): Meds ordered this encounter  Medications  . gabapentin (NEURONTIN) 400 MG capsule    Sig: Take 1 capsule (400 mg total) by mouth 3 (three) times daily.    Dispense:  90 capsule    Refill:  5  . traMADol  (ULTRAM) 50 MG tablet    Sig: Take 1 tablet (50 mg total) by mouth every 6 (six) hours as needed.    Dispense:  120 tablet    Refill:  2    For chronic pain syndrome   Orders:  Orders Placed This Encounter  Procedures  . Lumbar Epidural Injection    Standing Status:   Standing    Number of Occurrences:   9    Standing Expiration Date:   04/06/2021    Scheduling Instructions:     Purpose: Palliative     Indication: Lower extremity pain/Sciatica unspecified side (M54.30).     Side: Midline     Level: TBD     Sedation: Patient's choice.     TIMEFRAME: PRN procedure. ([name] will call when needed.)    Order Specific Question:   Where will this procedure be performed?    Answer:   [facility]  . ToxASSURE Select 13 (MW), Urine    Volume: 30 ml(s). Minimum 3 ml of urine is needed. Document temperature of fresh sample. Indications: Long term (current) use of opiate analgesic [phone])    Order Specific Question:   Release to patient    Answer:   Immediate   Follow-up plan:   Return in about 4 months (around 08/06/2020) for Medication Management, in person.     s/p lesi #1 (left L4/5) on 8/17, #2 on 10/21/2018 (left L3/4), #3 on 12/09/2018 (Left L3/4); 02/24/2019: Right wrist injection and left trigger finger (middle finger) injection.,  Bilateral wrist injection 10/15/2019, bilateral knee Hyalgan No. 1 01/26/1960. #2 02/23/2020, #3 03/24/2020.  Endorsing approximately 75 to 80% pain relief for bilateral knee pain.      Recent Visits Date Type Provider Dept  03/24/20 Procedure visit Gillis Santa, MD [facility]  02/23/20 Procedure visit Gillis Santa, MD [facility]  01/26/20 Procedure visit Gillis Santa, MD [facility]  01/08/20 Office Visit Gillis Santa, MD [facility]  Showing recent visits within past 90 days and meeting all other requirements Today's Visits Date Type Provider Dept  04/06/20 Office Visit Gillis Santa, MD [facility]  Showing today's visits and meeting all other requirements Future Appointments Date Type Provider Dept  05/19/20 Appointment Gillis Santa, MD [facility]  Showing future appointments within next 90 days and meeting all other requirements  I discussed the assessment and treatment plan with the patient. The patient was provided an opportunity to ask questions and all were answered. The patient agreed with the plan and demonstrated an understanding of the instructions.  Patient advised to call back or seek an in-person evaluation if the symptoms or condition worsens.  Duration of encounter:30 minutes.  Note by: Gillis Santa, MD Date: 04/06/2020; Time: 9:01 AM

## 2020-04-06 NOTE — Progress Notes (Signed)
Nursing Pain Medication Assessment:  Safety precautions to be maintained throughout the outpatient stay will include: orient to surroundings, keep bed in low position, maintain call bell within reach at all times, provide assistance with transfer out of bed and ambulation.  Medication Inspection Compliance: Pill count conducted under aseptic conditions, in front of the patient. Neither the pills nor the bottle was removed from the patient's sight at any time. Once count was completed pills were immediately returned to the patient in their original bottle.  Medication: Tramadol (Ultram) Pill/Patch Count: 21 of 120 pills remain Pill/Patch Appearance: Markings consistent with prescribed medication Bottle Appearance: Standard pharmacy container. Clearly labeled. Filled Date: 02 / 06 / 2022 Last Medication intake:  Today

## 2020-04-06 NOTE — Telephone Encounter (Signed)
Pt returned the call and said he does need the Mirtazapine medication refilled. He takes Trazodone and Mirtazapine 1 tablet at night before bedtime. He asked if there could be an option of only taking one medication instead of taking those two every night.

## 2020-04-13 LAB — TOXASSURE SELECT 13 (MW), URINE

## 2020-05-05 ENCOUNTER — Ambulatory Visit
Admission: RE | Admit: 2020-05-05 | Discharge: 2020-05-05 | Disposition: A | Discharge status: Home / Self Care | Payer: Medicare Other | Source: Ambulatory Visit | Attending: Student in an Organized Health Care Education/Training Program | Admitting: Student in an Organized Health Care Education/Training Program

## 2020-05-05 ENCOUNTER — Other Ambulatory Visit: Payer: Self-pay

## 2020-05-05 ENCOUNTER — Ambulatory Visit (HOSPITAL_BASED_OUTPATIENT_CLINIC_OR_DEPARTMENT_OTHER): Payer: Medicare Other | Admitting: Student in an Organized Health Care Education/Training Program

## 2020-05-05 ENCOUNTER — Encounter: Payer: Self-pay | Admitting: Student in an Organized Health Care Education/Training Program

## 2020-05-05 DIAGNOSIS — M5416 Radiculopathy, lumbar region: Secondary | ICD-10-CM | POA: Diagnosis present

## 2020-05-05 DIAGNOSIS — G894 Chronic pain syndrome: Secondary | ICD-10-CM

## 2020-05-05 DIAGNOSIS — G8929 Other chronic pain: Secondary | ICD-10-CM | POA: Insufficient documentation

## 2020-05-05 MED ORDER — SODIUM CHLORIDE 0.9% FLUSH
2.0000 mL | Freq: Once | INTRAVENOUS | Status: AC
  Administered 2020-05-05: 10 mL

## 2020-05-05 MED ORDER — DEXAMETHASONE SODIUM PHOSPHATE 10 MG/ML IJ SOLN
10.0000 mg | Freq: Once | INTRAMUSCULAR | Status: AC
  Administered 2020-05-05: 10 mg
  Filled 2020-05-05: qty 1

## 2020-05-05 MED ORDER — IOHEXOL 180 MG/ML  SOLN
10.0000 mL | Freq: Once | INTRAMUSCULAR | Status: AC
  Administered 2020-05-05: 10 mL via EPIDURAL
  Filled 2020-05-05: qty 20

## 2020-05-05 MED ORDER — LIDOCAINE HCL 2 % IJ SOLN
20.0000 mL | Freq: Once | INTRAMUSCULAR | Status: AC
  Administered 2020-05-05: 100 mg
  Filled 2020-05-05: qty 20

## 2020-05-05 MED ORDER — ROPIVACAINE HCL 2 MG/ML IJ SOLN
2.0000 mL | Freq: Once | INTRAMUSCULAR | Status: AC
  Administered 2020-05-05: 10 mL via EPIDURAL
  Filled 2020-05-05: qty 10

## 2020-05-05 MED ORDER — SODIUM CHLORIDE (PF) 0.9 % IJ SOLN
INTRAMUSCULAR | Status: AC
  Filled 2020-05-05: qty 10

## 2020-05-05 NOTE — Patient Instructions (Signed)

## 2020-05-05 NOTE — Progress Notes (Signed)
Patient's Name: [name]  MRN: [id number]  Referring Provider: Gillis Santa, MD  DOB: 1955-09-24  PCP: Wayne Hauser, DO  DOS: 05/05/2020  Note by: Wayne Santa, MD  Service setting: Ambulatory outpatient  Specialty: Interventional Pain Management  Patient type: Established  Location: [facility] (AMB) Pain Management Facility  Visit type: Interventional Procedure   Primary Reason for Visit: Interventional Pain Management Treatment. CC: Back Pain  Procedure:          Anesthesia, Analgesia, Anxiolysis:  Type: Therapeutic Inter-Laminar Epidural Steroid Injection  #1 (in 2022, previously done 07/2019)  Region: Lumbar Level: L3-4 Level. Laterality: Left-Sided         Type: Local Anesthesia  Local Anesthetic: Lidocaine 1-2%  Position: Prone with head of the table was raised to facilitate breathing.   Indications: 1. Chronic pain syndrome   2. Chronic radicular lumbar pain    Pain Score: Pre-procedure: 9 /10 Post-procedure: 4 /10   Patient's last dose of Xarelto was 3 days ago  Pre-op Assessment:  [name] is a 65 y.o. (year old), male patient, seen today for interventional treatment. He  has a past surgical history that includes Knee surgery; Gastric bypass (2002); prostate seeding; Carpal tunnel release (Right, 12/23/2014); Carpal tunnel release (Left, 01/06/2015); Colonoscopy with propofol (N/A, 10/08/2017); and Cardioversion (N/A, 05/15/2019). [name] has a current medication list which includes the following prescription(s): acetaminophen, amiodarone, amlodipine, benazepril, cyanocobalamin, cyclobenzaprine, ezetimibe, fluticasone, furosemide, gabapentin, metoprolol succinate, multivitamin with minerals, rivaroxaban, rosuvastatin, [START ON 05/17/2020] tramadol, trazodone, and salonpas pain relief patch. His primarily concern today is the Back Pain  Initial Vital Signs:  Pulse/HCG Rate: (!) 58ECG Heart Rate: (!) 54 Temp: (!) 97 F (36.1 C) Resp: 17 BP: (!) 155/84 SpO2: 98  %  BMI: Estimated body mass index is 50.18 kg/m as calculated from the following:   Height as of this encounter: 6' (1.829 m).   Weight as of this encounter: 370 lb (167.8 kg).  Risk Assessment: Allergies: Reviewed. He has No Known Allergies.  Allergy Precautions: None required Coagulopathies: Reviewed. None identified.  Blood-thinner therapy: None at this time Active Infection(s): Reviewed. None identified. [name] is afebrile  Site Confirmation: [name] was asked to confirm the procedure and laterality before marking the site Procedure checklist: Completed Consent: Before the procedure and under the influence of no sedative(s), amnesic(s), or anxiolytics, the patient was informed of the treatment options, risks and possible complications. To fulfill our ethical and legal obligations, as recommended by the American Medical Association's Code of Ethics, I have informed the patient of my clinical impression; the nature and purpose of the treatment or procedure; the risks, benefits, and possible complications of the intervention; the alternatives, including doing nothing; the risk(s) and benefit(s) of the alternative treatment(s) or procedure(s); and the risk(s) and benefit(s) of doing nothing. The patient was provided information about the general risks and possible complications associated with the procedure. These may include, but are not limited to: failure to achieve desired goals, infection, bleeding, organ or nerve damage, allergic reactions, paralysis, and death. In addition, the patient was informed of those risks and complications associated to Spine-related procedures, such as failure to decrease pain; infection (i.e.: Meningitis, epidural or intraspinal abscess); bleeding (i.e.: epidural hematoma, subarachnoid hemorrhage, or any other type of intraspinal or peri-dural bleeding); organ or nerve damage (i.e.: Any type of peripheral nerve, nerve root, or spinal cord injury) with  subsequent damage to sensory, motor, and/or autonomic systems, resulting in permanent pain, numbness, and/or weakness  of one or several areas of the body; allergic reactions; (i.e.: anaphylactic reaction); and/or death. Furthermore, the patient was informed of those risks and complications associated with the medications. These include, but are not limited to: allergic reactions (i.e.: anaphylactic or anaphylactoid reaction(s)); adrenal axis suppression; blood sugar elevation that in diabetics may result in ketoacidosis or comma; water retention that in patients with history of congestive heart failure may result in shortness of breath, pulmonary edema, and decompensation with resultant heart failure; weight gain; swelling or edema; medication-induced neural toxicity; particulate matter embolism and blood vessel occlusion with resultant organ, and/or nervous system infarction; and/or aseptic necrosis of one or more joints. Finally, the patient was informed that Medicine is not an exact science; therefore, there is also the possibility of unforeseen or unpredictable risks and/or possible complications that may result in a catastrophic outcome. The patient indicated having understood very clearly. We have given the patient no guarantees and we have made no promises. Enough time was given to the patient to ask questions, all of which were answered to the patient's satisfaction. [name] has indicated that he wanted to continue with the procedure. Attestation: I, the ordering provider, attest that I have discussed with the patient the benefits, risks, side-effects, alternatives, likelihood of achieving goals, and potential problems during recovery for the procedure that I have provided informed consent. Date  Time:   Pre-Procedure Preparation:  Monitoring: As per clinic protocol. Respiration, ETCO2, SpO2, BP, heart rate and rhythm monitor placed and checked for adequate function Safety Precautions: Patient  was assessed for positional comfort and pressure points before starting the procedure. Time-out: I initiated and conducted the "Time-out" before starting the procedure, as per protocol. The patient was asked to participate by confirming the accuracy of the "Time Out" information. Verification of the correct person, site, and procedure were performed and confirmed by me, the nursing staff, and the patient. "Time-out" conducted as per Joint Commission's Universal Protocol (UP.01.01.01). Time: 0957  Description of Procedure:          Target Area: The interlaminar space, initially targeting the lower laminar border of the superior vertebral body. Approach: Paramedial approach. Area Prepped: Entire Posterior Lumbar Region Prepping solution: DuraPrep (Iodine Povacrylex [0.7% available iodine] and Isopropyl Alcohol, 74% w/w) Safety Precautions: Aspiration looking for blood return was conducted prior to all injections. At no point did we inject any substances, as a needle was being advanced. No attempts were made at seeking any paresthesias. Safe injection practices and needle disposal techniques used. Medications properly checked for expiration dates. SDV (single dose vial) medications used. Description of the Procedure: Protocol guidelines were followed. The procedure needle was introduced through the skin, ipsilateral to the reported pain, and advanced to the target area. Bone was contacted and the needle walked caudad, until the lamina was cleared. The epidural space was identified using "loss-of-resistance technique" with 2-3 ml of PF-NaCl (0.9% NSS), in a 5cc LOR glass syringe.  Vitals:   05/05/20 0935 05/05/20 0959 05/05/20 1002  BP: (!) 155/84 137/76 131/82  Pulse: (!) 58    Resp:  17 17  Temp: (!) 97 F (36.1 C)    SpO2: 98% 93% 93%  Weight: (!) 370 lb (167.8 kg)    Height: 6' (1.829 m)      Start Time: 0957 hrs. End Time: 1002 hrs.  Materials:  Needle(s) Type: Epidural needle Gauge:  17G Length: 5-in Medication(s): Please see orders for medications and dosing details. 8 cc solution made of 4 cc  of preservative-free saline, 3 cc of 0.2% ropivacaine, 1 cc of Decadron 10 mg/cc.  Imaging Guidance (Spinal):          Type of Imaging Technique: Fluoroscopy Guidance (Spinal) Indication(s): Assistance in needle guidance and placement for procedures requiring needle placement in or near specific anatomical locations not easily accessible without such assistance. Exposure Time: Please see nurses notes. Contrast: Before injecting any contrast, we confirmed that the patient did not have an allergy to iodine, shellfish, or radiological contrast. Once satisfactory needle placement was completed at the desired level, radiological contrast was injected. Contrast injected under live fluoroscopy. No contrast complications. See chart for type and volume of contrast used. Fluoroscopic Guidance: I was personally present during the use of fluoroscopy. "Tunnel Vision Technique" used to obtain the best possible view of the target area. Parallax error corrected before commencing the procedure. "Direction-depth-direction" technique used to introduce the needle under continuous pulsed fluoroscopy. Once target was reached, antero-posterior, oblique, and lateral fluoroscopic projection used confirm needle placement in all planes. Images permanently stored in EMR. Interpretation: I personally interpreted the imaging intraoperatively. Adequate needle placement confirmed in multiple planes. Appropriate spread of contrast into desired area was observed. No evidence of afferent or efferent intravascular uptake. No intrathecal or subarachnoid spread observed. Permanent images saved into the patient's record.   Post-operative Assessment:  Post-procedure Vital Signs:  Pulse/HCG Rate: (!) 58(!) 54 Temp: (!) 97 F (36.1 C) Resp: 17 BP: 131/82 SpO2: 93 %  EBL: None  Complications: No immediate post-treatment  complications observed by team, or reported by patient.  Note: The patient tolerated the entire procedure well. A repeat set of vitals were taken after the procedure and the patient was kept under observation following institutional policy, for this type of procedure. Post-procedural neurological assessment was performed, showing return to baseline, prior to discharge. The patient was provided with post-procedure discharge instructions, including a section on how to identify potential problems. Should any problems arise concerning this procedure, the patient was given instructions to immediately contact us, at any time, without hesitation. In any case, we plan to contact the patient by telephone for a follow-up status report regarding this interventional procedure.  Comments:  No additional relevant information. 5 out of 5 strength bilateral lower extremity: Plantar flexion, dorsiflexion, knee flexion, knee extension.  Plan of Care  Orders:  Orders Placed This Encounter  Procedures  . DG PAIN CLINIC C-ARM 1-60 MIN NO REPORT    Intraoperative interpretation by procedural physician at [place].    Standing Status:   Standing    Number of Occurrences:   1    Order Specific Question:   Reason for exam:    Answer:   Assistance in needle guidance and placement for procedures requiring needle placement in or near specific anatomical locations not easily accessible without such assistance.   Patient instructed to restart Xarelto tomorrow so long as he is not having any lower extremity weakness after nursing phone call.  Medications ordered for procedure: Meds ordered this encounter  Medications  . iohexol (OMNIPAQUE) 180 MG/ML injection 10 mL    Must be Myelogram-compatible. If not available, you may substitute with a water-soluble, non-ionic, hypoallergenic, myelogram-compatible radiological contrast medium.  Marland Kitchen lidocaine (XYLOCAINE) 2 % (with pres) injection 400 mg  . ropivacaine  (PF) 2 mg/mL (0.2%) (NAROPIN) injection 2 mL  . sodium chloride flush (NS) 0.9 % injection 2 mL  . dexamethasone (DECADRON) injection 10 mg   Medications administered: We administered iohexol, lidocaine,  ropivacaine (PF) 2 mg/mL (0.2%), sodium chloride flush, and dexamethasone.  See the medical record for exact dosing, route, and time of administration.  Follow-up plan:   Return for Keep sch. appt.    Recent Visits Date Type Provider Dept  04/06/20 Office Visit Wayne Santa, MD [facility]  03/24/20 Procedure visit Wayne Santa, MD [facility]  02/23/20 Procedure visit Wayne Santa, MD [facility]  Showing recent visits within past 90 days and meeting all other requirements Today's Visits Date Type Provider Dept  05/05/20 Procedure visit Wayne Santa, MD [facility]  Showing today's visits and meeting all other requirements Future Appointments Date Type Provider Dept  05/19/20 Appointment Wayne Santa, MD [facility]  07/22/20 Appointment Wayne Santa, MD [facility]  Showing future appointments within next 90 days and meeting all other requirements  Disposition: Discharge home  Discharge Date & Time: 05/05/2020; 1010 hrs.   Primary Care Physician: Wayne Hauser, DO Location: [facility] Note by: Wayne Santa, MD Date: 05/05/2020; Time: 10:06 AM  Disclaimer:  Medicine is not an exact science. The only guarantee in medicine is that nothing is guaranteed. It is important to note that the decision to proceed with this intervention was based on the information collected from the patient. The Data and conclusions were drawn from the patient's questionnaire, the interview, and the physical examination. Because the information was provided in large part by the patient, it cannot be guaranteed that it has not been purposely or unconsciously manipulated. Every effort has been made to  obtain as much relevant data as possible for this evaluation. It is important to note that the conclusions that lead to this procedure are derived in large part from the available data. Always take into account that the treatment will also be dependent on availability of resources and existing treatment guidelines, considered by other Pain Management Practitioners as being common knowledge and practice, at the time of the intervention. For Medico-Legal purposes, it is also important to point out that variation in procedural techniques and pharmacological choices are the acceptable norm. The indications, contraindications, technique, and results of the above procedure should only be interpreted and judged by a Board-Certified Interventional Pain Specialist with extensive familiarity and expertise in the same exact procedure and technique.

## 2020-05-05 NOTE — Progress Notes (Signed)
Safety precautions to be maintained throughout the outpatient stay will include: orient to surroundings, keep bed in low position, maintain call bell within reach at all times, provide assistance with transfer out of bed and ambulation.  

## 2020-05-06 ENCOUNTER — Telehealth: Payer: Self-pay | Admitting: *Deleted

## 2020-05-06 NOTE — Telephone Encounter (Signed)
No problems post procedure. 

## 2020-05-10 ENCOUNTER — Telehealth: Payer: Self-pay

## 2020-05-10 ENCOUNTER — Other Ambulatory Visit: Payer: Self-pay | Admitting: Cardiovascular Disease

## 2020-05-10 ENCOUNTER — Other Ambulatory Visit: Payer: Self-pay | Admitting: *Deleted

## 2020-05-10 DIAGNOSIS — I480 Paroxysmal atrial fibrillation: Secondary | ICD-10-CM

## 2020-05-10 NOTE — Telephone Encounter (Signed)
Sending to anticoag for review and recommendations.

## 2020-05-10 NOTE — Telephone Encounter (Signed)
Pt's age 65, wt 167.8 kg, SCr 1.07, CrCl 161.18, last ov w/ TG 07/28/19.

## 2020-05-10 NOTE — Telephone Encounter (Signed)
Please see note below. 

## 2020-05-10 NOTE — Telephone Encounter (Signed)
Refill Request.  

## 2020-05-10 NOTE — Telephone Encounter (Signed)
Fax received drug not covered by pt plan; The preferred alternative is Eliquis.  Please advise if pt should continue Xarelto or switch to alternative. Pharmacy will need to be called or faxed per pharmacy to change medication, strength, directions, quantity and refills.  [facility] [address] Tel: [phone] Fax: [phone]

## 2020-05-10 NOTE — Telephone Encounter (Signed)
*  STAT* If patient is at the pharmacy, call can be transferred to refill team.   1. Which medications need to be refilled? (please list name of each medication and dose if known) Xarelto  2. Which pharmacy/location (including street and city if local pharmacy) is medication to be sent to? [facility] [place]  3. Do they need a 30 day or 90 day supply? [place]

## 2020-05-11 MED ORDER — ELIQUIS 5 MG PO TABS: 5.0000 mg | ORAL_TABLET | Freq: Two times a day (BID) | ORAL | 1 refills | Status: DC

## 2020-05-11 NOTE — Telephone Encounter (Signed)
Lmovm to contact office to discuss medication change.

## 2020-05-11 NOTE — Telephone Encounter (Addendum)
Ok to change pt to Eliquis if his formulary prefers it. He qualifies for Eliquis 5mg  1 tablet twice daily (spaced apart every 12 hours). Please let pt know I have sent rx into his pharmacy. First dose of Eliquis should be taken 24 hours after his last dose of Xarelto.

## 2020-05-11 NOTE — Telephone Encounter (Signed)
Noted- a prescription for eliquis was sent to the patient's pharmacy earlier today.

## 2020-05-11 NOTE — Telephone Encounter (Signed)
Pt made aware of change and agreed with picking up his medication from the pharmacy.

## 2020-05-12 NOTE — Telephone Encounter (Signed)
I spoke with pt today and he mentioned that he can't afford Eliquis 5 mg bid. Pt contacted his insurance to discuss coverage and see what medication would be covered. Pt was told by insurance that Warfarin would be covered for free. Pt would like more information on this medication and discuss if this is an option for him. Pt mentioned that he can't pay 700+ dollars for his first refill and then 400+dollars monthly for 90 day refill. Pt would like to discuss his alternatives. Please advise pt.

## 2020-05-12 NOTE — Telephone Encounter (Signed)
Pt has taken his last pill of Xarelto today. Pt is concerned of running out of his blood thinner before he is switched.

## 2020-05-12 NOTE — Telephone Encounter (Addendum)
Spoke with the patient. Patient sts that he was unable to pick up Eliquis due to cost. He has continued taking Xarelto 20 mg daily. He is down to his last pill.  Patient sts that his insurance would cover coumadin at no cost to him.  Adv the patient that we could provide him samples of Xarelto so that there is not interruption in his anticoag therapy and fwd the update to Dr. Rockey Situ to advise on a longterm solution.  Patient will come to our office tomorrow morning to pick up the samples.  Samples of this drug were left at the front desk to be picked up  quantity 2 bottles Lot Number [id number]

## 2020-05-12 NOTE — Telephone Encounter (Signed)
Patient returning call.

## 2020-05-13 NOTE — Telephone Encounter (Signed)
Warfarin would be a last resort.  Free does not mean good if it puts him in jeopardy I find it difficult to believe that insurance will not do better job covering either Xarelto or Eliquis as they are preferred agents with better outcomes He may need to call insurance again and find out if there is paperwork we can fill out to push one of the preferred agents  Does he have prescription insurance?  If not that may be the reason why, perhaps he could be covered with company assistance

## 2020-05-14 NOTE — Addendum Note (Signed)
Addended by: Marcelle Overlie D on: 05/14/2020 11:40 AM   Modules accepted: Orders

## 2020-05-14 NOTE — Telephone Encounter (Signed)
Patient could sign up for Herb Grays and receive Xarelto for $240/90 day supply plus tax Patient has a $480 deductible and then a 23% coinsurance this is why it is so expensive.   He can try to apply for patient assistance for Eliquis, but if he did not spend much $ out of pocket this year on medications than he wont qualify.  Best bet would be Ranelle Oyster select and choose a better plan next year (although its still expensive with medicare) If he cant afford then will need to do warfarin

## 2020-05-16 ENCOUNTER — Other Ambulatory Visit: Payer: Self-pay | Admitting: Nurse Practitioner

## 2020-05-16 DIAGNOSIS — M19031 Primary osteoarthritis, right wrist: Secondary | ICD-10-CM

## 2020-05-16 NOTE — Telephone Encounter (Signed)
Requested medication (s) are due for refill today: yes  Requested medication (s) are on the active medication list: yes  Last refill:  02/23/20  Future visit scheduled: yes  Notes to clinic:  med not delegated to NT to RF   Requested Prescriptions  Pending Prescriptions Disp Refills   cyclobenzaprine (FLEXERIL) 10 MG tablet [Pharmacy Med Name: CYCLOBENZAPRINE 10MG  TABLETS] 30 tablet 0    Sig: TAKE 1 TABLET(10 MG) BY MOUTH THREE TIMES DAILY AS NEEDED FOR MUSCLE SPASMS      Not Delegated - Analgesics:  Muscle Relaxants Failed - 05/16/2020  9:32 AM      Failed - This refill cannot be delegated      Passed - Valid encounter within last 6 months    Recent Outpatient Visits           2 months ago Acute dysfunction of right eustachian tube   [place], DO   6 months ago Insomnia, unspecified type   [facility] Kathrine Haddock, NP   9 months ago Essential hypertension   [place], Lilia Argue, [place]   1 year ago Essential hypertension   [facility] Volney American, [place]   1 year ago Annual physical exam   [facility] Volney American, [place]       Future Appointments             In 2 months Parks Ranger, Devonne Doughty, DO [place], [place]   In 2 months Gollan, Kathlene November, MD [facility], [place]

## 2020-05-17 ENCOUNTER — Telehealth: Payer: Self-pay | Admitting: *Deleted

## 2020-05-17 NOTE — Telephone Encounter (Signed)
Prior authorization has been submitted through Covermymeds for Xarelto 20 mg qd.  Awaiting Approval.

## 2020-05-17 NOTE — Telephone Encounter (Signed)
[facility] is reviewing your PA request and will respond within 24 hours for Medicaid or up to 72 hours for non-Medicaid plans, based on the required timeframe determined by state or federal regulations. To check for an update later, open this request from your dashboard.

## 2020-05-17 NOTE — Telephone Encounter (Signed)
Pt utilizes MyChart, sent a message regarding Xarelto PA with Gwendel Hanson and last option would be to switch to warfarin.

## 2020-05-17 NOTE — Telephone Encounter (Signed)
Please advise if ok to proceed with PA for Xarelto.

## 2020-05-17 NOTE — Telephone Encounter (Signed)
Patient calling in with update from insurance company. Patient states by [facility] of [facility] will now cover Xarelto with a prior auth and after that is done patient would be able to find out the cost.  [phone] is the phone number patient was given to complete the prior auth Please advise

## 2020-05-17 NOTE — Telephone Encounter (Signed)
PA has been submitted via covermymeds for Xarelto 20 mg qd. Awaiting approval.  [facility] is reviewing your PA request and will respond within 24 hours for Medicaid or up to 72 hours for non-Medicaid plans, based on the required timeframe determined by state or federal regulations. To check for an update later, open this request from your dashboard.

## 2020-05-18 ENCOUNTER — Telehealth: Payer: Self-pay

## 2020-05-18 NOTE — Telephone Encounter (Signed)
[facility] denied Medicare Prescription Drug Coverage under [employer] for Xarelto.  Appeal can be made.   Denial letter and appeal form faxed to PharmD for review.

## 2020-05-18 NOTE — Telephone Encounter (Addendum)
Xarelto was denied because it's not on his formulary, Eliquis is a Tier 3 (23% copay) and Pradaxa is a Tier 4 (45% copay), both would be well over $100/month and that's after he pays a $445 deductible.  I will submit an appeals with his insurance to see if they will reconsider covering Xarelto. He has been taking this since at least 2016 so he must have changed insurance plans recently as his current insurance plan has some of the worst coverage I've seen for branded medications plus has a very high deductible.   If his appeals is denied, he will need to start warfarin and would strongly recommend he look for a different Part D supplement plan next year that covers Eliquis/Xarelto at a set copay (usually ~$45/month) rather than a % copay.  Urgent appeals has been submitted over the phone at #[phone]. Determination will be made within 72 hours. Pt will be notified and [place] office will receive a fax - please be on the lookout for this.

## 2020-05-18 NOTE — Telephone Encounter (Signed)
PA has been denied via covermymeds. PA denial has been printed and placed in nurse's bin for appeal.

## 2020-05-19 ENCOUNTER — Other Ambulatory Visit: Payer: Self-pay

## 2020-05-19 ENCOUNTER — Telehealth: Payer: Self-pay

## 2020-05-19 ENCOUNTER — Encounter: Payer: Self-pay | Admitting: Student in an Organized Health Care Education/Training Program

## 2020-05-19 ENCOUNTER — Ambulatory Visit
Payer: Medicare Other | Attending: Student in an Organized Health Care Education/Training Program | Admitting: Student in an Organized Health Care Education/Training Program

## 2020-05-19 VITALS — BP 140/83 | HR 52 | Temp 97.3°F | Resp 17 | Ht 72.0 in | Wt 370.0 lb

## 2020-05-19 DIAGNOSIS — M170 Bilateral primary osteoarthritis of knee: Secondary | ICD-10-CM | POA: Diagnosis present

## 2020-05-19 MED ORDER — SODIUM HYALURONATE (VISCOSUP) 20 MG/2ML IX SOSY: 2.0000 mL | PREFILLED_SYRINGE | Freq: Once | INTRA_ARTICULAR | Status: AC

## 2020-05-19 MED ORDER — LIDOCAINE HCL (PF) 2 % IJ SOLN
INTRAMUSCULAR | Status: AC
  Filled 2020-05-19: qty 10

## 2020-05-19 MED ORDER — RIVAROXABAN 20 MG PO TABS: 20.0000 mg | ORAL_TABLET | Freq: Every day | ORAL | 1 refills | Status: DC

## 2020-05-19 MED ORDER — LIDOCAINE HCL 2 % IJ SOLN
20.0000 mL | Freq: Once | INTRAMUSCULAR | Status: AC
  Administered 2020-05-19: 200 mg

## 2020-05-19 NOTE — Progress Notes (Signed)
PROVIDER NOTE: Information contained herein reflects review and annotations entered in association with encounter. Interpretation of such information and data should be left to medically-trained personnel. Information provided to patient can be located elsewhere in the medical record under "Patient Instructions". Document created using STT-dictation technology, any transcriptional errors that may result from process are unintentional.    Patient: [name]  Service Category: Procedure  Provider: Gillis Santa, MD  DOB: 1955/03/12  DOS: 05/19/2020  Location: [facility]  MRN: [id number]  Setting: Ambulatory - outpatient  Referring Provider: Nobie Putnam *  Type: Established Patient  Specialty: Interventional Pain Management  PCP: Olin Hauser, DO   Primary Reason for Visit: Interventional Pain Management Treatment. CC: Knee Pain (bilateral)  Procedure:          Anesthesia, Analgesia, Anxiolysis:  Type: Therapeutic Intra-Articular Hyalgan Knee Injection #4  Region: Medial infrapatellar Knee Region Level: Knee Joint Laterality: Bilateral  Type: Local Anesthesia Indication(s): Analgesia         Local Anesthetic: Lidocaine 1-2% Route: Infiltration ([place]/IM) IV Access: Declined Sedation: Declined   Position: Sitting   Indications: 1. Bilateral primary osteoarthritis of knee    Pain Score: Pre-procedure: 2 /10 Post-procedure: 1 /10   Pre-op H&P Assessment:  [name] is a 65 y.o. (year old), male patient, seen today for interventional treatment. He  has a past surgical history that includes Knee surgery; Gastric bypass (2002); prostate seeding; Carpal tunnel release (Right, 12/23/2014); Carpal tunnel release (Left, 01/06/2015); Colonoscopy with propofol (N/A, 10/08/2017); and Cardioversion (N/A, 05/15/2019). [name] has a current medication list which includes the following prescription(s): acetaminophen, amiodarone, amlodipine, benazepril, cyanocobalamin,  cyclobenzaprine, ezetimibe, fluticasone, furosemide, gabapentin, ibuprofen, metoprolol succinate, multivitamin with minerals, rivaroxaban, rosuvastatin, tramadol, trazodone, and salonpas pain relief patch, and the following Facility-Administered Medications: lidocaine, sodium hyaluronate, and sodium hyaluronate. His primarily concern today is the Knee Pain (bilateral)  Initial Vital Signs:  Pulse/HCG Rate: (!) 52  Temp: (!) 97.3 F (36.3 C) Resp: 17 BP: 140/83 SpO2: 96 %  BMI: Estimated body mass index is 50.18 kg/m as calculated from the following:   Height as of this encounter: 6' (1.829 m).   Weight as of this encounter: 370 lb (167.8 kg).  Risk Assessment: Allergies: Reviewed. He has No Known Allergies.  Allergy Precautions: None required Coagulopathies: Reviewed. None identified.  Blood-thinner therapy: None at this time Active Infection(s): Reviewed. None identified. [name] is afebrile  Site Confirmation: [name] was asked to confirm the procedure and laterality before marking the site Procedure checklist: Completed Consent: Before the procedure and under the influence of no sedative(s), amnesic(s), or anxiolytics, the patient was informed of the treatment options, risks and possible complications. To fulfill our ethical and legal obligations, as recommended by the American Medical Association's Code of Ethics, I have informed the patient of my clinical impression; the nature and purpose of the treatment or procedure; the risks, benefits, and possible complications of the intervention; the alternatives, including doing nothing; the risk(s) and benefit(s) of the alternative treatment(s) or procedure(s); and the risk(s) and benefit(s) of doing nothing. The patient was provided information about the general risks and possible complications associated with the procedure. These may include, but are not limited to: failure to achieve desired goals, infection, bleeding, organ or nerve  damage, allergic reactions, paralysis, and death. In addition, the patient was informed of those risks and complications associated to the procedure, such as failure to decrease pain; infection; bleeding; organ or nerve damage with subsequent  damage to sensory, motor, and/or autonomic systems, resulting in permanent pain, numbness, and/or weakness of one or several areas of the body; allergic reactions; (i.e.: anaphylactic reaction); and/or death. Furthermore, the patient was informed of those risks and complications associated with the medications. These include, but are not limited to: allergic reactions (i.e.: anaphylactic or anaphylactoid reaction(s)); adrenal axis suppression; blood sugar elevation that in diabetics may result in ketoacidosis or comma; water retention that in patients with history of congestive heart failure may result in shortness of breath, pulmonary edema, and decompensation with resultant heart failure; weight gain; swelling or edema; medication-induced neural toxicity; particulate matter embolism and blood vessel occlusion with resultant organ, and/or nervous system infarction; and/or aseptic necrosis of one or more joints. Finally, the patient was informed that Medicine is not an exact science; therefore, there is also the possibility of unforeseen or unpredictable risks and/or possible complications that may result in a catastrophic outcome. The patient indicated having understood very clearly. We have given the patient no guarantees and we have made no promises. Enough time was given to the patient to ask questions, all of which were answered to the patient's satisfaction. [name] has indicated that he wanted to continue with the procedure. Attestation: I, the ordering provider, attest that I have discussed with the patient the benefits, risks, side-effects, alternatives, likelihood of achieving goals, and potential problems during recovery for the procedure that I have provided  informed consent. Date  Time: 05/19/2020  8:53 AM  Pre-Procedure Preparation:  Monitoring: As per clinic protocol. Respiration, ETCO2, SpO2, BP, heart rate and rhythm monitor placed and checked for adequate function Safety Precautions: Patient was assessed for positional comfort and pressure points before starting the procedure. Time-out: I initiated and conducted the "Time-out" before starting the procedure, as per protocol. The patient was asked to participate by confirming the accuracy of the "Time Out" information. Verification of the correct person, site, and procedure were performed and confirmed by me, the nursing staff, and the patient. "Time-out" conducted as per Joint Commission's Universal Protocol (UP.01.01.01). Time: 0927  Description of Procedure:          Target Area: Knee Joint Approach: Just above the Medial tibial plateau, lateral to the infrapatellar tendon. Area Prepped: Entire knee area, from the mid-thigh to the mid-shin. DuraPrep (Iodine Povacrylex [0.7% available iodine] and Isopropyl Alcohol, 74% w/w) Safety Precautions: Aspiration looking for blood return was conducted prior to all injections. At no point did we inject any substances, as a needle was being advanced. No attempts were made at seeking any paresthesias. Safe injection practices and needle disposal techniques used. Medications properly checked for expiration dates. SDV (single dose vial) medications used. Description of the Procedure: Protocol guidelines were followed. The patient was placed in position over the fluoroscopy table. The target area was identified and the area prepped in the usual manner. Skin & deeper tissues infiltrated with local anesthetic. Appropriate amount of time allowed to pass for local anesthetics to take effect. The procedure needles were then advanced to the target area. Proper needle placement secured. Negative aspiration confirmed. Solution injected in intermittent fashion, asking for  systemic symptoms every 0.5cc of injectate. The needles were then removed and the area cleansed, making sure to leave some of the prepping solution back to take advantage of its long term bactericidal properties. Vitals:   05/19/20 0857  BP: 140/83  Pulse: (!) 52  Resp: 17  Temp: (!) 97.3 F (36.3 C)  TempSrc: Temporal  SpO2: 96%  Weight: (!) 370 lb (167.8 kg)  Height: 6' (1.829 m)    Start Time: 0927 hrs. End Time:   hrs. Materials:  Needle(s) Type: Regular needle Gauge: 25G Length: 1.5-in Medication(s): Please see orders for medications and dosing details.  Post-operative Assessment:  Post-procedure Vital Signs:  Pulse/HCG Rate: (!) 52  Temp: (!) 97.3 F (36.3 C) Resp: 17 BP: 140/83 SpO2: 96 %  EBL: None  Complications: No immediate post-treatment complications observed by team, or reported by patient.  Note: The patient tolerated the entire procedure well. A repeat set of vitals were taken after the procedure and the patient was kept under observation following institutional policy, for this type of procedure. Post-procedural neurological assessment was performed, showing return to baseline, prior to discharge. The patient was provided with post-procedure discharge instructions, including a section on how to identify potential problems. Should any problems arise concerning this procedure, the patient was given instructions to immediately contact us, at any time, without hesitation. In any case, we plan to contact the patient by telephone for a follow-up status report regarding this interventional procedure.  Comments:  No additional relevant information.  Plan of Care  Orders:  No orders of the defined types were placed in this encounter.  Chronic Opioid Analgesic:  Tramadol 50 mg 4 times daily as needed, quantity 120/month; MME equals 20    Medications ordered for procedure: Meds ordered this encounter  Medications  . Sodium Hyaluronate SOSY 2 mL  . Sodium  Hyaluronate SOSY 2 mL  . lidocaine (XYLOCAINE) 2 % (with pres) injection 400 mg    Follow-up plan:   Return for [facility] appt.      s/p lesi #1 (left L4/5) on 8/17, #2 on 10/21/2018 (left L3/4), #3 on 12/09/2018 (Left L3/4); 02/24/2019: Right wrist injection and left trigger finger (middle finger) injection.,  Bilateral wrist injection 10/15/2019, bilateral knee Hyalgan No. 1 01/26/1960. #2 02/23/2020, #3 03/24/2020.  Endorsing approximately 75 to 80% pain relief for bilateral knee pain.     Recent Visits Date Type Provider Dept  05/05/20 Procedure visit Gillis Santa, MD [facility]  04/06/20 Office Visit Gillis Santa, MD [facility]  03/24/20 Procedure visit Gillis Santa, MD [facility]  02/23/20 Procedure visit Gillis Santa, MD [facility]  Showing recent visits within past 90 days and meeting all other requirements Today's Visits Date Type Provider Dept  05/19/20 Procedure visit Gillis Santa, MD [facility]  Showing today's visits and meeting all other requirements Future Appointments Date Type Provider Dept  07/22/20 Appointment Gillis Santa, MD [facility]  Showing future appointments within next 90 days and meeting all other requirements  Disposition: Discharge home  Discharge (Date  Time): 05/19/2020; 0940 hrs.   Primary Care Physician: Olin Hauser, DO Location: [facility] Note by: Gillis Santa, MD Date: 05/19/2020; Time: 9:33 AM  Disclaimer:  Medicine is not an exact science. The only guarantee in medicine is that nothing is guaranteed. It is important to note that the decision to proceed with this intervention was based on the information collected from the patient. The Data and conclusions were drawn from the patient's questionnaire, the interview, and the physical examination. Because the information was provided in large part by the patient, it cannot be guaranteed that it has  not been purposely or unconsciously manipulated. Every effort has been made to obtain as much relevant data as possible for this evaluation. It is important to note that the  conclusions that lead to this procedure are derived in large part from the available data. Always take into account that the treatment will also be dependent on availability of resources and existing treatment guidelines, considered by other Pain Management Practitioners as being common knowledge and practice, at the time of the intervention. For Medico-Legal purposes, it is also important to point out that variation in procedural techniques and pharmacological choices are the acceptable norm. The indications, contraindications, technique, and results of the above procedure should only be interpreted and judged by a Board-Certified Interventional Pain Specialist with extensive familiarity and expertise in the same exact procedure and technique.

## 2020-05-19 NOTE — Telephone Encounter (Signed)
Please see below.

## 2020-05-19 NOTE — Telephone Encounter (Signed)
incoming fax from [facility]  "we have approved coverage or payment for the following non-formilary prescription drug that you or your patient requested under his or her medicare prescription drug plan: Xarelto 20MG   We're happy to let you and your patient know that this request has been approved from January 17, 2020 until May 18, 2021."   Called patients pharmacy to make sure they had this approval on file and they stated that Xarelto was no covered although I have the approval letter.   Spoke with Raquel G, RPH about this situation and she stated that since both eliquis and Xarelto is at the pharmacy it will not go through because they are the same class of medication. She stated that we would need to send in a refill for Xarelto 20MG  and call the pharmacy to get them to reverse the eliquis since we have now got the Xarelto 20mg  approved.

## 2020-05-19 NOTE — Telephone Encounter (Signed)
Spoke w/ pharmacist @ [facility] in [place].  She got my earlier message about cancelling Eliquis, so this was already closed. She will remove Xarelto from her list to fill. Xarelto 20 mg #90 w/ 1 refill sent to [facility] per pt request.  Attempted to contact pt to let him know, lmom asking him to call back w/ any further questions or concerns.

## 2020-05-19 NOTE — Patient Instructions (Signed)

## 2020-05-19 NOTE — Telephone Encounter (Signed)
Looks like med list is correct in pt's chart. Called [facility] and left message to have them d/c pt's Eliquis and fill the Xarelto rx. Provided them w/ my direct # should they have any questions.

## 2020-05-19 NOTE — Telephone Encounter (Signed)
incoming fax from [facility]  "we have approved coverage or payment for the following non-formilary prescription drug that you or your patient requested under his or her medicare prescription drug plan: Xarelto 20MG   We're happy to let you and your patient know that this request has been approved from January 17, 2020 until May 18, 2021."

## 2020-05-19 NOTE — Progress Notes (Signed)
Safety precautions to be maintained throughout the outpatient stay will include: orient to surroundings, keep bed in low position, maintain call bell within reach at all times, provide assistance with transfer out of bed and ambulation.  

## 2020-05-20 ENCOUNTER — Telehealth: Payer: Self-pay | Admitting: *Deleted

## 2020-05-20 NOTE — Telephone Encounter (Signed)
States no pain in his knees. Denies any post procedure issues.

## 2020-05-20 NOTE — Telephone Encounter (Signed)
Excellent, I see that rx was sent to [facility] yesterday as well. They should be able to process his Xarelto rx for $85 now.

## 2020-06-07 ENCOUNTER — Telehealth: Payer: Self-pay | Admitting: Cardiovascular Disease

## 2020-06-07 ENCOUNTER — Other Ambulatory Visit: Payer: Self-pay

## 2020-06-07 ENCOUNTER — Ambulatory Visit
Admission: RE | Admit: 2020-06-07 | Discharge: 2020-06-07 | Disposition: A | Discharge status: Home / Self Care | Payer: Medicare Other | Source: Ambulatory Visit | Attending: Student in an Organized Health Care Education/Training Program | Admitting: Student in an Organized Health Care Education/Training Program

## 2020-06-07 ENCOUNTER — Ambulatory Visit (HOSPITAL_BASED_OUTPATIENT_CLINIC_OR_DEPARTMENT_OTHER): Payer: Medicare Other | Admitting: Student in an Organized Health Care Education/Training Program

## 2020-06-07 ENCOUNTER — Encounter: Payer: Self-pay | Admitting: Student in an Organized Health Care Education/Training Program

## 2020-06-07 VITALS — BP 139/85 | HR 59 | Temp 97.3°F | Resp 17 | Ht 72.0 in | Wt 370.0 lb

## 2020-06-07 DIAGNOSIS — G894 Chronic pain syndrome: Secondary | ICD-10-CM | POA: Diagnosis present

## 2020-06-07 DIAGNOSIS — G8929 Other chronic pain: Secondary | ICD-10-CM | POA: Insufficient documentation

## 2020-06-07 DIAGNOSIS — M5416 Radiculopathy, lumbar region: Secondary | ICD-10-CM | POA: Diagnosis present

## 2020-06-07 DIAGNOSIS — I10 Essential (primary) hypertension: Secondary | ICD-10-CM

## 2020-06-07 MED ORDER — BENAZEPRIL HCL 40 MG PO TABS: ORAL_TABLET | ORAL | 0 refills | Status: DC

## 2020-06-07 MED ORDER — EZETIMIBE 10 MG PO TABS: ORAL_TABLET | ORAL | 3 refills | Status: DC

## 2020-06-07 MED ORDER — SODIUM CHLORIDE (PF) 0.9 % IJ SOLN
INTRAMUSCULAR | Status: AC
  Filled 2020-06-07: qty 10

## 2020-06-07 MED ORDER — SODIUM CHLORIDE 0.9% FLUSH
2.0000 mL | Freq: Once | INTRAVENOUS | Status: AC
  Administered 2020-06-07: 2 mL

## 2020-06-07 MED ORDER — IOHEXOL 180 MG/ML  SOLN
10.0000 mL | Freq: Once | INTRAMUSCULAR | Status: AC
  Administered 2020-06-07: 10 mL via EPIDURAL

## 2020-06-07 MED ORDER — AMLODIPINE BESYLATE 5 MG PO TABS: 5.0000 mg | ORAL_TABLET | Freq: Every day | ORAL | 0 refills | Status: DC

## 2020-06-07 MED ORDER — DEXAMETHASONE SODIUM PHOSPHATE 10 MG/ML IJ SOLN
10.0000 mg | Freq: Once | INTRAMUSCULAR | Status: AC
  Administered 2020-06-07: 10 mg
  Filled 2020-06-07: qty 1

## 2020-06-07 MED ORDER — EZETIMIBE 10 MG PO TABS: ORAL_TABLET | ORAL | 0 refills | Status: DC

## 2020-06-07 MED ORDER — LIDOCAINE HCL 2 % IJ SOLN
20.0000 mL | Freq: Once | INTRAMUSCULAR | Status: AC
  Administered 2020-06-07: 400 mg
  Filled 2020-06-07: qty 20

## 2020-06-07 MED ORDER — ROPIVACAINE HCL 2 MG/ML IJ SOLN
2.0000 mL | Freq: Once | INTRAMUSCULAR | Status: AC
  Administered 2020-06-07: 2 mL via EPIDURAL
  Filled 2020-06-07: qty 10

## 2020-06-07 NOTE — Telephone Encounter (Signed)
Requested Prescriptions   Signed Prescriptions Disp Refills   ezetimibe (ZETIA) 10 MG tablet 90 tablet 0    Sig: TAKE 1 TABLET(10 MG) BY MOUTH DAILY    Authorizing Provider: Minna Merritts    Ordering User: NEWCOMER MCCLAIN, Dalvin Clipper L   amLODipine (NORVASC) 5 MG tablet 90 tablet 0    Sig: Take 1 tablet (5 mg total) by mouth daily.    Authorizing Provider: Minna Merritts    Ordering User: NEWCOMER MCCLAIN, Rudolf Blizard L   benazepril (LOTENSIN) 40 MG tablet 90 tablet 0    Sig: TAKE 1 TABLET(40 MG) BY MOUTH DAILY    Authorizing Provider: Minna Merritts    Ordering User: NEWCOMER MCCLAIN, Duaine Radin L

## 2020-06-07 NOTE — Telephone Encounter (Signed)
Zetia refilled sent to [facility] for cheaper cost with [facility] of $18.54 for 90-day supply as pt requested through MyChart as [facility] was over $170 with insurance

## 2020-06-07 NOTE — Progress Notes (Signed)
Safety precautions to be maintained throughout the outpatient stay will include: orient to surroundings, keep bed in low position, maintain call bell within reach at all times, provide assistance with transfer out of bed and ambulation.  

## 2020-06-07 NOTE — Progress Notes (Signed)
Patient's Name: [name]  MRN: [id number]  Referring Provider: Nobie Putnam *  DOB: January 26, 1955  PCP: Olin Hauser, DO  DOS: 06/07/2020  Note by: Gillis Santa, MD  Service setting: Ambulatory outpatient  Specialty: Interventional Pain Management  Patient type: Established  Location: [facility] (AMB) Pain Management Facility  Visit type: Interventional Procedure   Primary Reason for Visit: Interventional Pain Management Treatment. CC: Back Pain (lower)  Procedure:          Anesthesia, Analgesia, Anxiolysis:  Type: Therapeutic Inter-Laminar Epidural Steroid Injection  #2 (in 2022) Region: Lumbar Level: L3-4 Level. Laterality: Midline         Type: Local Anesthesia  Local Anesthetic: Lidocaine 1-2%  Position: Prone with head of the table was raised to facilitate breathing.   Indications: 1. Chronic radicular lumbar pain   2. Lumbar radiculopathy   3. Chronic pain syndrome    Pain Score: Pre-procedure: 9 /10 Post-procedure: 9 /10   Patient's last dose of Xarelto was 3 days ago  Pre-op Assessment:  Mr. Tesfai is a 65 y.o. (year old), male patient, seen today for interventional treatment. He  has a past surgical history that includes Knee surgery; Gastric bypass (2002); prostate seeding; Carpal tunnel release (Right, 12/23/2014); Carpal tunnel release (Left, 01/06/2015); Colonoscopy with propofol (N/A, 10/08/2017); and Cardioversion (N/A, 05/15/2019). Mr. Baerwald has a current medication list which includes the following prescription(s): acetaminophen, amiodarone, amlodipine, benazepril, cyanocobalamin, cyclobenzaprine, ezetimibe, fluticasone, furosemide, gabapentin, ibuprofen, metoprolol succinate, multivitamin with minerals, rosuvastatin, tramadol, trazodone, salonpas pain relief patch, and rivaroxaban. His primarily concern today is the Back Pain (lower)  Initial Vital Signs:  Pulse/HCG Rate: (!) 50  Temp: (!) 97.3 F (36.3 C) Resp: 16 BP: (!) 146/76 SpO2: 97  %  BMI: Estimated body mass index is 50.18 kg/m as calculated from the following:   Height as of this encounter: 6' (1.829 m).   Weight as of this encounter: 370 lb (167.8 kg).  Risk Assessment: Allergies: Reviewed. He has No Known Allergies.  Allergy Precautions: None required Coagulopathies: Reviewed. None identified.  Blood-thinner therapy: None at this time Active Infection(s): Reviewed. None identified. Mr. Rupe is afebrile  Site Confirmation: Mr. Rathgeb was asked to confirm the procedure and laterality before marking the site Procedure checklist: Completed Consent: Before the procedure and under the influence of no sedative(s), amnesic(s), or anxiolytics, the patient was informed of the treatment options, risks and possible complications. To fulfill our ethical and legal obligations, as recommended by the American Medical Association's Code of Ethics, I have informed the patient of my clinical impression; the nature and purpose of the treatment or procedure; the risks, benefits, and possible complications of the intervention; the alternatives, including doing nothing; the risk(s) and benefit(s) of the alternative treatment(s) or procedure(s); and the risk(s) and benefit(s) of doing nothing. The patient was provided information about the general risks and possible complications associated with the procedure. These may include, but are not limited to: failure to achieve desired goals, infection, bleeding, organ or nerve damage, allergic reactions, paralysis, and death. In addition, the patient was informed of those risks and complications associated to Spine-related procedures, such as failure to decrease pain; infection (i.e.: Meningitis, epidural or intraspinal abscess); bleeding (i.e.: epidural hematoma, subarachnoid hemorrhage, or any other type of intraspinal or peri-dural bleeding); organ or nerve damage (i.e.: Any type of peripheral nerve, nerve root, or spinal cord injury) with  subsequent damage to sensory, motor, and/or autonomic systems, resulting in permanent pain, numbness, and/or weakness of one  or several areas of the body; allergic reactions; (i.e.: anaphylactic reaction); and/or death. Furthermore, the patient was informed of those risks and complications associated with the medications. These include, but are not limited to: allergic reactions (i.e.: anaphylactic or anaphylactoid reaction(s)); adrenal axis suppression; blood sugar elevation that in diabetics may result in ketoacidosis or comma; water retention that in patients with history of congestive heart failure may result in shortness of breath, pulmonary edema, and decompensation with resultant heart failure; weight gain; swelling or edema; medication-induced neural toxicity; particulate matter embolism and blood vessel occlusion with resultant organ, and/or nervous system infarction; and/or aseptic necrosis of one or more joints. Finally, the patient was informed that Medicine is not an exact science; therefore, there is also the possibility of unforeseen or unpredictable risks and/or possible complications that may result in a catastrophic outcome. The patient indicated having understood very clearly. We have given the patient no guarantees and we have made no promises. Enough time was given to the patient to ask questions, all of which were answered to the patient's satisfaction. Mr. Kimberling has indicated that he wanted to continue with the procedure. Attestation: I, the ordering provider, attest that I have discussed with the patient the benefits, risks, side-effects, alternatives, likelihood of achieving goals, and potential problems during recovery for the procedure that I have provided informed consent. Date  Time:   Pre-Procedure Preparation:  Monitoring: As per clinic protocol. Respiration, ETCO2, SpO2, BP, heart rate and rhythm monitor placed and checked for adequate function Safety Precautions: Patient  was assessed for positional comfort and pressure points before starting the procedure. Time-out: I initiated and conducted the "Time-out" before starting the procedure, as per protocol. The patient was asked to participate by confirming the accuracy of the "Time Out" information. Verification of the correct person, site, and procedure were performed and confirmed by me, the nursing staff, and the patient. "Time-out" conducted as per Joint Commission's Universal Protocol (UP.01.01.01). Time: 1430  Description of Procedure:          Target Area: The interlaminar space, initially targeting the lower laminar border of the superior vertebral body. Approach: Paramedial approach. Area Prepped: Entire Posterior Lumbar Region Prepping solution: DuraPrep (Iodine Povacrylex [0.7% available iodine] and Isopropyl Alcohol, 74% w/w) Safety Precautions: Aspiration looking for blood return was conducted prior to all injections. At no point did we inject any substances, as a needle was being advanced. No attempts were made at seeking any paresthesias. Safe injection practices and needle disposal techniques used. Medications properly checked for expiration dates. SDV (single dose vial) medications used. Description of the Procedure: Protocol guidelines were followed. The procedure needle was introduced through the skin, ipsilateral to the reported pain, and advanced to the target area. Bone was contacted and the needle walked caudad, until the lamina was cleared. The epidural space was identified using "loss-of-resistance technique" with 2-3 ml of PF-NaCl (0.9% NSS), in a 5cc LOR glass syringe.  Vitals:   06/07/20 1415 06/07/20 1425 06/07/20 1435 06/07/20 1440  BP: (!) 154/95 (!) 154/87 140/87 139/85  Pulse: (!) 48 (!) 48 (!) 52 (!) 59  Resp: 18 15 16 17   Temp:      TempSrc:      SpO2: 97% 95% 95% 96%  Weight:      Height:        Start Time: 1430 hrs. End Time: 1436 hrs.  Materials:  Needle(s) Type:  Epidural needle Gauge: 17G Length: 5-in Medication(s): Please see orders for medications and dosing details.  8 cc solution made of 4 cc of preservative-free saline, 3 cc of 0.2% ropivacaine, 1 cc of Decadron 10 mg/cc.  Imaging Guidance (Spinal):          Type of Imaging Technique: Fluoroscopy Guidance (Spinal) Indication(s): Assistance in needle guidance and placement for procedures requiring needle placement in or near specific anatomical locations not easily accessible without such assistance. Exposure Time: Please see nurses notes. Contrast: Before injecting any contrast, we confirmed that the patient did not have an allergy to iodine, shellfish, or radiological contrast. Once satisfactory needle placement was completed at the desired level, radiological contrast was injected. Contrast injected under live fluoroscopy. No contrast complications. See chart for type and volume of contrast used. Fluoroscopic Guidance: I was personally present during the use of fluoroscopy. "Tunnel Vision Technique" used to obtain the best possible view of the target area. Parallax error corrected before commencing the procedure. "Direction-depth-direction" technique used to introduce the needle under continuous pulsed fluoroscopy. Once target was reached, antero-posterior, oblique, and lateral fluoroscopic projection used confirm needle placement in all planes. Images permanently stored in EMR. Interpretation: I personally interpreted the imaging intraoperatively. Adequate needle placement confirmed in multiple planes. Appropriate spread of contrast into desired area was observed. No evidence of afferent or efferent intravascular uptake. No intrathecal or subarachnoid spread observed. Permanent images saved into the patient's record.   Post-operative Assessment:  Post-procedure Vital Signs:  Pulse/HCG Rate: (!) 59 (sb)  Temp: (!) 97.3 F (36.3 C) Resp: 17 BP: 139/85 SpO2: 96 %  EBL: None  Complications: No  immediate post-treatment complications observed by team, or reported by patient.  Note: The patient tolerated the entire procedure well. A repeat set of vitals were taken after the procedure and the patient was kept under observation following institutional policy, for this type of procedure. Post-procedural neurological assessment was performed, showing return to baseline, prior to discharge. The patient was provided with post-procedure discharge instructions, including a section on how to identify potential problems. Should any problems arise concerning this procedure, the patient was given instructions to immediately contact us, at any time, without hesitation. In any case, we plan to contact the patient by telephone for a follow-up status report regarding this interventional procedure.  Comments:  No additional relevant information. 5 out of 5 strength bilateral lower extremity: Plantar flexion, dorsiflexion, knee flexion, knee extension.  Plan of Care  Orders:  Orders Placed This Encounter  Procedures  . [facility] C-ARM 1-60 MIN NO REPORT    Intraoperative interpretation by procedural physician at [facility].    Standing Status:   Standing    Number of Occurrences:   1    Order Specific Question:   Reason for exam:    Answer:   Assistance in needle guidance and placement for procedures requiring needle placement in or near specific anatomical locations not easily accessible without such assistance.   Patient instructed to restart Xarelto tomorrow so long as he is not having any lower extremity weakness after nursing phone call.  Medications ordered for procedure: Meds ordered this encounter  Medications  . iohexol (OMNIPAQUE) 180 MG/ML injection 10 mL    Must be Myelogram-compatible. If not available, you may substitute with a water-soluble, non-ionic, hypoallergenic, myelogram-compatible radiological contrast medium.  Marland Kitchen lidocaine (XYLOCAINE) 2 % (with pres) injection  400 mg  . ropivacaine (PF) 2 mg/mL (0.2%) (NAROPIN) injection 2 mL  . sodium chloride flush (NS) 0.9 % injection 2 mL  . dexamethasone (DECADRON) injection 10 mg  Medications administered: We administered iohexol, lidocaine, ropivacaine (PF) 2 mg/mL (0.2%), sodium chloride flush, and dexamethasone.  See the medical record for exact dosing, route, and time of administration.  Follow-up plan:   Return for [facility] appt.    Recent Visits Date Type Provider Dept  05/19/20 Procedure visit Gillis Santa, MD [facility]  05/05/20 Procedure visit Gillis Santa, MD [facility]  04/06/20 Office Visit Gillis Santa, MD [facility]  03/24/20 Procedure visit Gillis Santa, MD [facility]  Showing recent visits within past 90 days and meeting all other requirements Today's Visits Date Type Provider Dept  06/07/20 Procedure visit Gillis Santa, MD [facility]  Showing today's visits and meeting all other requirements Future Appointments Date Type Provider Dept  07/07/20 Appointment Gillis Santa, MD [facility]  07/22/20 Appointment Gillis Santa, MD [facility]  Showing future appointments within next 90 days and meeting all other requirements  Disposition: Discharge home  Discharge Date & Time: 06/07/2020; 1442 hrs.   Primary Care Physician: Olin Hauser, DO Location: [facility] Note by: Gillis Santa, MD Date: 06/07/2020; Time: 3:16 PM  Disclaimer:  Medicine is not an exact science. The only guarantee in medicine is that nothing is guaranteed. It is important to note that the decision to proceed with this intervention was based on the information collected from the patient. The Data and conclusions were drawn from the patient's questionnaire, the interview, and the physical examination. Because the information was provided in large part by the patient, it cannot be guaranteed that  it has not been purposely or unconsciously manipulated. Every effort has been made to obtain as much relevant data as possible for this evaluation. It is important to note that the conclusions that lead to this procedure are derived in large part from the available data. Always take into account that the treatment will also be dependent on availability of resources and existing treatment guidelines, considered by other Pain Management Practitioners as being common knowledge and practice, at the time of the intervention. For Medico-Legal purposes, it is also important to point out that variation in procedural techniques and pharmacological choices are the acceptable norm. The indications, contraindications, technique, and results of the above procedure should only be interpreted and judged by a Board-Certified Interventional Pain Specialist with extensive familiarity and expertise in the same exact procedure and technique.

## 2020-06-07 NOTE — Telephone Encounter (Signed)
Patient calling back in to state the Zetia was going to cost the patient $173.99 for a 90 day Rx. Patient is unable to pay this amount and would like an alternative if able  Please advise

## 2020-06-07 NOTE — Telephone Encounter (Signed)
*  STAT* If patient is at the pharmacy, call can be transferred to refill team.   1. Which medications need to be refilled? (please list name of each medication and dose if known) amlodipine, benazepril and zetia   2. Which pharmacy/location (including street and city if local pharmacy) is medication to be sent to? [facility] in [place]   3. Do they need a 30 day or 90 day supply? [place]

## 2020-06-07 NOTE — Telephone Encounter (Signed)
Sent MyChart message advising pt to use GoodRx coupon for lower rates and bypassing insurance.   [facility] and [facility] can get 90-day supply under $25.00

## 2020-06-08 ENCOUNTER — Telehealth: Payer: Self-pay | Admitting: *Deleted

## 2020-06-08 NOTE — Telephone Encounter (Signed)
No problems post procedure. 

## 2020-06-18 ENCOUNTER — Other Ambulatory Visit: Payer: Self-pay

## 2020-06-18 DIAGNOSIS — I10 Essential (primary) hypertension: Secondary | ICD-10-CM

## 2020-06-18 MED ORDER — BENAZEPRIL HCL 40 MG PO TABS: ORAL_TABLET | ORAL | 1 refills | Status: DC

## 2020-06-21 ENCOUNTER — Other Ambulatory Visit: Payer: Self-pay

## 2020-06-21 DIAGNOSIS — I10 Essential (primary) hypertension: Secondary | ICD-10-CM

## 2020-06-21 MED ORDER — FUROSEMIDE 20 MG PO TABS: 20.0000 mg | ORAL_TABLET | Freq: Every day | ORAL | 3 refills | Status: DC | PRN

## 2020-06-21 MED ORDER — METOPROLOL SUCCINATE ER 50 MG PO TB24: ORAL_TABLET | ORAL | 3 refills | Status: DC

## 2020-06-21 MED ORDER — AMLODIPINE BESYLATE 5 MG PO TABS: 5.0000 mg | ORAL_TABLET | Freq: Every day | ORAL | 3 refills | Status: DC

## 2020-06-21 MED ORDER — BENAZEPRIL HCL 40 MG PO TABS: ORAL_TABLET | ORAL | 3 refills | Status: DC

## 2020-06-21 MED ORDER — AMIODARONE HCL 200 MG PO TABS: 200.0000 mg | ORAL_TABLET | Freq: Every day | ORAL | 3 refills | Status: DC

## 2020-06-21 MED ORDER — ROSUVASTATIN CALCIUM 10 MG PO TABS: ORAL_TABLET | ORAL | 3 refills | Status: DC

## 2020-06-21 NOTE — Telephone Encounter (Signed)
Pt requested meds be transfer to [facility] [place] d/t new insurance

## 2020-06-23 ENCOUNTER — Other Ambulatory Visit: Payer: Self-pay

## 2020-06-23 DIAGNOSIS — M19031 Primary osteoarthritis, right wrist: Secondary | ICD-10-CM

## 2020-06-23 DIAGNOSIS — M19032 Primary osteoarthritis, left wrist: Secondary | ICD-10-CM

## 2020-06-23 MED ORDER — CYCLOBENZAPRINE HCL 10 MG PO TABS: ORAL_TABLET | ORAL | 2 refills | Status: DC

## 2020-06-28 ENCOUNTER — Encounter: Payer: Self-pay | Admitting: Student in an Organized Health Care Education/Training Program

## 2020-06-28 ENCOUNTER — Other Ambulatory Visit: Payer: Self-pay

## 2020-06-28 ENCOUNTER — Ambulatory Visit
Admission: RE | Admit: 2020-06-28 | Discharge: 2020-06-28 | Disposition: A | Discharge status: Home / Self Care | Payer: Medicare Other | Source: Ambulatory Visit | Attending: Student in an Organized Health Care Education/Training Program | Admitting: Student in an Organized Health Care Education/Training Program

## 2020-06-28 ENCOUNTER — Ambulatory Visit (HOSPITAL_BASED_OUTPATIENT_CLINIC_OR_DEPARTMENT_OTHER): Payer: Medicare Other | Admitting: Student in an Organized Health Care Education/Training Program

## 2020-06-28 VITALS — BP 153/92 | HR 53 | Temp 98.4°F | Resp 16 | Ht 72.0 in | Wt 370.0 lb

## 2020-06-28 DIAGNOSIS — G8929 Other chronic pain: Secondary | ICD-10-CM

## 2020-06-28 DIAGNOSIS — G894 Chronic pain syndrome: Secondary | ICD-10-CM | POA: Diagnosis present

## 2020-06-28 DIAGNOSIS — M5416 Radiculopathy, lumbar region: Secondary | ICD-10-CM | POA: Diagnosis present

## 2020-06-28 MED ORDER — LIDOCAINE HCL 2 % IJ SOLN
INTRAMUSCULAR | Status: AC
  Filled 2020-06-28: qty 10

## 2020-06-28 MED ORDER — DEXAMETHASONE SODIUM PHOSPHATE 10 MG/ML IJ SOLN
10.0000 mg | Freq: Once | INTRAMUSCULAR | Status: AC
  Administered 2020-06-28: 10 mg

## 2020-06-28 MED ORDER — LIDOCAINE HCL 2 % IJ SOLN
20.0000 mL | Freq: Once | INTRAMUSCULAR | Status: AC
  Administered 2020-06-28: 200 mg

## 2020-06-28 MED ORDER — ROPIVACAINE HCL 2 MG/ML IJ SOLN
2.0000 mL | Freq: Once | INTRAMUSCULAR | Status: AC
  Administered 2020-06-28: 2 mL via EPIDURAL

## 2020-06-28 MED ORDER — SODIUM CHLORIDE 0.9% FLUSH
2.0000 mL | Freq: Once | INTRAVENOUS | Status: AC
  Administered 2020-06-28: 2 mL

## 2020-06-28 MED ORDER — IOHEXOL 180 MG/ML  SOLN
10.0000 mL | Freq: Once | INTRAMUSCULAR | Status: AC
  Administered 2020-06-28: 10 mL via EPIDURAL

## 2020-06-28 MED ORDER — SODIUM CHLORIDE (PF) 0.9 % IJ SOLN
INTRAMUSCULAR | Status: AC
  Filled 2020-06-28: qty 10

## 2020-06-28 MED ORDER — DEXAMETHASONE SODIUM PHOSPHATE 10 MG/ML IJ SOLN
INTRAMUSCULAR | Status: AC
  Filled 2020-06-28: qty 1

## 2020-06-28 MED ORDER — ROPIVACAINE HCL 2 MG/ML IJ SOLN
INTRAMUSCULAR | Status: AC
  Filled 2020-06-28: qty 20

## 2020-06-28 MED ORDER — IOPAMIDOL (ISOVUE-M 200) INJECTION 41%: 10.0000 mL | Freq: Once | INTRAMUSCULAR | Status: DC

## 2020-06-28 NOTE — Patient Instructions (Signed)

## 2020-06-28 NOTE — Progress Notes (Signed)
Safety precautions to be maintained throughout the outpatient stay will include: orient to surroundings, keep bed in low position, maintain call bell within reach at all times, provide assistance with transfer out of bed and ambulation.  

## 2020-06-28 NOTE — Progress Notes (Signed)
Patient's Name: [name]  MRN: [id number]  Referring Provider: Nobie Putnam *  DOB: June 30, 1955  PCP: Olin Hauser, DO  DOS: 06/28/2020  Note by: Gillis Santa, MD  Service setting: Ambulatory outpatient  Specialty: Interventional Pain Management  Patient type: Established  Location: [facility] (AMB) Pain Management Facility  Visit type: Interventional Procedure   Primary Reason for Visit: Interventional Pain Management Treatment. CC: Back Pain (Left is worse traveling down left leg )  Procedure:          Anesthesia, Analgesia, Anxiolysis:  Type: Therapeutic Inter-Laminar Epidural Steroid Injection  #3 (in 2022) Region: Lumbar Level: L3-4 Level. Laterality: Left-Sided         Type: Local Anesthesia  Local Anesthetic: Lidocaine 1-2%  Position: Prone with head of the table was raised to facilitate breathing.   Indications: 1. Chronic radicular lumbar pain   2. Lumbar radiculopathy   3. Chronic pain syndrome     Pain Score: Pre-procedure: 9 /10 Post-procedure: 0-No pain (Steady on feet and no headache)/10   Patient's last dose of Xarelto was 3 days ago Patient had a lumbar epidural steroid injection performed 06/07/2020.  He was experiencing 100% pain relief for approximately 7 days uncertainly he had an increase in his left-sided lumbar radicular pain.  This did not abate.  He states that he had a better weekend but then the pain returned on Sunday.  I told him that while this is very close and timeline to his previous epidural, it is reasonable to consider as the patient is having considerable pain and difficulty walking and has responded favorably to previous ESI's.  Pre-op Assessment:  [name] is a 65 y.o. (year old), male patient, seen today for interventional treatment. He  has a past surgical history that includes Knee surgery; Gastric bypass (2002); prostate seeding; Carpal tunnel release (Right, 12/23/2014); Carpal tunnel release (Left, 01/06/2015); Colonoscopy  with propofol (N/A, 10/08/2017); and Cardioversion (N/A, 05/15/2019). [name] has a current medication list which includes the following prescription(s): acetaminophen, amiodarone, amlodipine, benazepril, cyanocobalamin, cyclobenzaprine, ezetimibe, fluticasone, furosemide, gabapentin, ibuprofen, metoprolol succinate, multivitamin with minerals, rivaroxaban, rosuvastatin, tramadol, trazodone, and salonpas pain relief patch, and the following Facility-Administered Medications: iopamidol. His primarily concern today is the Back Pain (Left is worse traveling down left leg )  Initial Vital Signs:  Pulse/HCG Rate: (!) 51  Temp: 98.4 F (36.9 C) Resp: 16 BP: (!) 140/109 SpO2: 100 %  BMI: Estimated body mass index is 50.18 kg/m as calculated from the following:   Height as of this encounter: 6' (1.829 m).   Weight as of this encounter: 370 lb (167.8 kg).  Risk Assessment: Allergies: Reviewed. He has No Known Allergies.  Allergy Precautions: None required Coagulopathies: Reviewed. None identified.  Blood-thinner therapy: None at this time Active Infection(s): Reviewed. None identified. [name] is afebrile  Site Confirmation: [name] was asked to confirm the procedure and laterality before marking the site Procedure checklist: Completed Consent: Before the procedure and under the influence of no sedative(s), amnesic(s), or anxiolytics, the patient was informed of the treatment options, risks and possible complications. To fulfill our ethical and legal obligations, as recommended by the American Medical Association's Code of Ethics, I have informed the patient of my clinical impression; the nature and purpose of the treatment or procedure; the risks, benefits, and possible complications of the intervention; the alternatives, including doing nothing; the risk(s) and benefit(s) of the alternative treatment(s) or procedure(s); and the risk(s) and benefit(s) of doing nothing. The patient  was  provided information about the general risks and possible complications associated with the procedure. These may include, but are not limited to: failure to achieve desired goals, infection, bleeding, organ or nerve damage, allergic reactions, paralysis, and death. In addition, the patient was informed of those risks and complications associated to Spine-related procedures, such as failure to decrease pain; infection (i.e.: Meningitis, epidural or intraspinal abscess); bleeding (i.e.: epidural hematoma, subarachnoid hemorrhage, or any other type of intraspinal or peri-dural bleeding); organ or nerve damage (i.e.: Any type of peripheral nerve, nerve root, or spinal cord injury) with subsequent damage to sensory, motor, and/or autonomic systems, resulting in permanent pain, numbness, and/or weakness of one or several areas of the body; allergic reactions; (i.e.: anaphylactic reaction); and/or death. Furthermore, the patient was informed of those risks and complications associated with the medications. These include, but are not limited to: allergic reactions (i.e.: anaphylactic or anaphylactoid reaction(s)); adrenal axis suppression; blood sugar elevation that in diabetics may result in ketoacidosis or comma; water retention that in patients with history of congestive heart failure may result in shortness of breath, pulmonary edema, and decompensation with resultant heart failure; weight gain; swelling or edema; medication-induced neural toxicity; particulate matter embolism and blood vessel occlusion with resultant organ, and/or nervous system infarction; and/or aseptic necrosis of one or more joints. Finally, the patient was informed that Medicine is not an exact science; therefore, there is also the possibility of unforeseen or unpredictable risks and/or possible complications that may result in a catastrophic outcome. The patient indicated having understood very clearly. We have given the patient no guarantees  and we have made no promises. Enough time was given to the patient to ask questions, all of which were answered to the patient's satisfaction. [name] has indicated that he wanted to continue with the procedure. Attestation: I, the ordering provider, attest that I have discussed with the patient the benefits, risks, side-effects, alternatives, likelihood of achieving goals, and potential problems during recovery for the procedure that I have provided informed consent. Date  Time:   Pre-Procedure Preparation:  Monitoring: As per clinic protocol. Respiration, ETCO2, SpO2, BP, heart rate and rhythm monitor placed and checked for adequate function Safety Precautions: Patient was assessed for positional comfort and pressure points before starting the procedure. Time-out: I initiated and conducted the "Time-out" before starting the procedure, as per protocol. The patient was asked to participate by confirming the accuracy of the "Time Out" information. Verification of the correct person, site, and procedure were performed and confirmed by me, the nursing staff, and the patient. "Time-out" conducted as per Joint Commission's Universal Protocol (UP.01.01.01). Time: 1154  Description of Procedure:          Target Area: The interlaminar space, initially targeting the lower laminar border of the superior vertebral body. Approach: Paramedial approach. Area Prepped: Entire Posterior Lumbar Region Prepping solution: DuraPrep (Iodine Povacrylex [0.7% available iodine] and Isopropyl Alcohol, 74% w/w) Safety Precautions: Aspiration looking for blood return was conducted prior to all injections. At no point did we inject any substances, as a needle was being advanced. No attempts were made at seeking any paresthesias. Safe injection practices and needle disposal techniques used. Medications properly checked for expiration dates. SDV (single dose vial) medications used. Description of the Procedure: Protocol  guidelines were followed. The procedure needle was introduced through the skin, ipsilateral to the reported pain, and advanced to the target area. Bone was contacted and the needle walked caudad, until the lamina was cleared. The epidural  space was identified using "loss-of-resistance technique" with 2-3 ml of PF-NaCl (0.9% NSS), in a 5cc LOR glass syringe.  Vitals:   06/28/20 1137 06/28/20 1150 06/28/20 1200  BP: (!) 140/109 (!) 152/89 (!) 153/92  Pulse: (!) 51 (!) 51 (!) 53  Resp: 16 16 16   Temp: 98.4 F (36.9 C)    TempSrc: Temporal    SpO2: 100% 96% 97%  Weight: (!) 370 lb (167.8 kg)    Height: 6' (1.829 m)      Start Time: 1154 hrs. End Time: 1159 hrs.  Materials:  Needle(s) Type: Epidural needle Gauge: 17G Length: 5-in Medication(s): Please see orders for medications and dosing details. 8 cc solution made of 4 cc of preservative-free saline, 3 cc of 0.2% ropivacaine, 1 cc of Decadron 10 mg/cc.  Imaging Guidance (Spinal):          Type of Imaging Technique: Fluoroscopy Guidance (Spinal) Indication(s): Assistance in needle guidance and placement for procedures requiring needle placement in or near specific anatomical locations not easily accessible without such assistance. Exposure Time: Please see nurses notes. Contrast: Before injecting any contrast, we confirmed that the patient did not have an allergy to iodine, shellfish, or radiological contrast. Once satisfactory needle placement was completed at the desired level, radiological contrast was injected. Contrast injected under live fluoroscopy. No contrast complications. See chart for type and volume of contrast used. Fluoroscopic Guidance: I was personally present during the use of fluoroscopy. "Tunnel Vision Technique" used to obtain the best possible view of the target area. Parallax error corrected before commencing the procedure. "Direction-depth-direction" technique used to introduce the needle under continuous pulsed  fluoroscopy. Once target was reached, antero-posterior, oblique, and lateral fluoroscopic projection used confirm needle placement in all planes. Images permanently stored in EMR. Interpretation: I personally interpreted the imaging intraoperatively. Adequate needle placement confirmed in multiple planes. Appropriate spread of contrast into desired area was observed. No evidence of afferent or efferent intravascular uptake. No intrathecal or subarachnoid spread observed. Permanent images saved into the patient's record.   Post-operative Assessment:  Post-procedure Vital Signs:  Pulse/HCG Rate: (!) 53 (sb)  Temp: 98.4 F (36.9 C) Resp: 16 BP: (!) 153/92 SpO2: 97 %  EBL: None  Complications: No immediate post-treatment complications observed by team, or reported by patient.  Note: The patient tolerated the entire procedure well. A repeat set of vitals were taken after the procedure and the patient was kept under observation following institutional policy, for this type of procedure. Post-procedural neurological assessment was performed, showing return to baseline, prior to discharge. The patient was provided with post-procedure discharge instructions, including a section on how to identify potential problems. Should any problems arise concerning this procedure, the patient was given instructions to immediately contact us, at any time, without hesitation. In any case, we plan to contact the patient by telephone for a follow-up status report regarding this interventional procedure.  Comments:  No additional relevant information. 5 out of 5 strength bilateral lower extremity: Plantar flexion, dorsiflexion, knee flexion, knee extension.  Plan of Care  Orders:  Orders Placed This Encounter  Procedures   DG PAIN CLINIC C-ARM 1-60 MIN NO REPORT    Intraoperative interpretation by procedural physician at [place].    Standing Status:   Standing    Number of Occurrences:   1    Order  Specific Question:   Reason for exam:    Answer:   Assistance in needle guidance and placement for procedures requiring needle placement in  or near specific anatomical locations not easily accessible without such assistance.    Patient instructed to restart Xarelto tomorrow so long as he is not having any lower extremity weakness after nursing phone call.  Medications ordered for procedure: Meds ordered this encounter  Medications   iohexol (OMNIPAQUE) 180 MG/ML injection 10 mL    Must be Myelogram-compatible. If not available, you may substitute with a water-soluble, non-ionic, hypoallergenic, myelogram-compatible radiological contrast medium.   iopamidol (ISOVUE-M) 41 % intrathecal injection 10 mL    Must be Myelogram-compatible. If not available, you may substitute with a water-soluble, non-ionic, hypoallergenic, myelogram-compatible radiological contrast medium.   lidocaine (XYLOCAINE) 2 % (with pres) injection 400 mg   ropivacaine (PF) 2 mg/mL (0.2%) (NAROPIN) injection 2 mL   sodium chloride flush (NS) 0.9 % injection 2 mL   dexamethasone (DECADRON) injection 10 mg    Medications administered: We administered iohexol, lidocaine, ropivacaine (PF) 2 mg/mL (0.2%), sodium chloride flush, and dexamethasone.  See the medical record for exact dosing, route, and time of administration.  Follow-up plan:   Return for Keep sch. appt.    Recent Visits Date Type Provider Dept  06/07/20 Procedure visit Gillis Santa, MD [facility]  05/19/20 Procedure visit Gillis Santa, MD [facility]  05/05/20 Procedure visit Gillis Santa, MD [facility]  04/06/20 Office Visit Gillis Santa, MD [facility]  Showing recent visits within past 90 days and meeting all other requirements Today's Visits Date Type Provider Dept  06/28/20 Procedure visit Gillis Santa, MD [facility]  Showing today's visits and meeting all other requirements Future  Appointments Date Type Provider Dept  07/22/20 Appointment Gillis Santa, MD [facility]  Showing future appointments within next 90 days and meeting all other requirements Disposition: Discharge home  Discharge Date & Time: 06/28/2020; 1204 hrs.   Primary Care Physician: Olin Hauser, DO Location: [facility] Out[facility] Note by: Gillis Santa, MD Date: 06/28/2020; Time: 12:08 PM  Disclaimer:  Medicine is not an exact science. The only guarantee in medicine is that nothing is guaranteed. It is important to note that the decision to proceed with this intervention was based on the information collected from the patient. The Data and conclusions were drawn from the patient's questionnaire, the interview, and the physical examination. Because the information was provided in large part by the patient, it cannot be guaranteed that it has not been purposely or unconsciously manipulated. Every effort has been made to obtain as much relevant data as possible for this evaluation. It is important to note that the conclusions that lead to this procedure are derived in large part from the available data. Always take into account that the treatment will also be dependent on availability of resources and existing treatment guidelines, considered by other Pain Management Practitioners as being common knowledge and practice, at the time of the intervention. For Medico-Legal purposes, it is also important to point out that variation in procedural techniques and pharmacological choices are the acceptable norm. The indications, contraindications, technique, and results of the above procedure should only be interpreted and judged by a Board-Certified Interventional Pain Specialist with extensive familiarity and expertise in the same exact procedure and technique.

## 2020-06-29 ENCOUNTER — Telehealth: Payer: Self-pay | Admitting: *Deleted

## 2020-06-29 NOTE — Telephone Encounter (Signed)
No problems post procedure. 

## 2020-07-05 ENCOUNTER — Other Ambulatory Visit: Payer: Self-pay | Admitting: Family Medicine

## 2020-07-05 DIAGNOSIS — M19031 Primary osteoarthritis, right wrist: Secondary | ICD-10-CM

## 2020-07-05 DIAGNOSIS — M19032 Primary osteoarthritis, left wrist: Secondary | ICD-10-CM

## 2020-07-05 DIAGNOSIS — G4700 Insomnia, unspecified: Secondary | ICD-10-CM

## 2020-07-05 MED ORDER — TRAZODONE HCL 150 MG PO TABS: 150.0000 mg | ORAL_TABLET | Freq: Every day | ORAL | 0 refills | Status: DC

## 2020-07-05 NOTE — Telephone Encounter (Signed)
Requested medication (s) are due for refill today: no  Requested medication (s) are on the active medication list: yes  Last refill:  06/23/20 #30 2 RF  Future visit scheduled: yes  Notes to clinic:  med not delegated to NT to refill or REFUSE   Requested Prescriptions  Pending Prescriptions Disp Refills   cyclobenzaprine (FLEXERIL) 10 MG tablet [Pharmacy Med Name: CYCLOBENZAPRINE 10 MG TABLET] 90 tablet     Sig: TAKE 1 TABLET(10 MG) BY MOUTH THREE TIMES DAILY AS NEEDED FOR MUSCLE SPASMS      Not Delegated - Analgesics:  Muscle Relaxants Failed - 07/05/2020  4:42 PM      Failed - This refill cannot be delegated      Passed - Valid encounter within last 6 months    Recent Outpatient Visits           4 months ago Acute dysfunction of right eustachian tube   [place], DO   8 months ago Insomnia, unspecified type   [facility] Kathrine Haddock, NP   11 months ago Essential hypertension   [place], Lilia Argue, [place]   1 year ago Essential hypertension   [facility] Volney American, [place]   1 year ago Annual physical exam   [facility] Volney American, [place]       Future Appointments             In 3 weeks Parks Ranger, Devonne Doughty, DO [facility], [place]   In 3 weeks Rockey Situ, Kathlene November, MD [facility], [place]

## 2020-07-05 NOTE — Telephone Encounter (Signed)
Copied from [place] [id number]. Topic: Quick Communication - Rx Refill/Question >> Jul 05, 2020  4:33 PM Tessa Lerner A wrote: Medication: traZODone (DESYREL) 150 MG tablet   Has the patient contacted their pharmacy? No. (Agent: If no, request that the patient contact the pharmacy for the refill.) (Agent: If yes, when and what did the pharmacy advise?)  Preferred Pharmacy (with phone number or street name): [id number] - [place], [place] - [place]  Phone:  [phone] Fax:  [phone]  Agent: Please be advised that RX refills may take up to 3 business days. We ask that you follow-up with your pharmacy.

## 2020-07-05 NOTE — Telephone Encounter (Signed)
Protocol every 6 months . Future visit in 3 weeks

## 2020-07-07 ENCOUNTER — Telehealth: Payer: Medicare Other | Admitting: Student in an Organized Health Care Education/Training Program

## 2020-07-08 ENCOUNTER — Other Ambulatory Visit: Payer: Self-pay | Admitting: Family Medicine

## 2020-07-08 DIAGNOSIS — H6991 Unspecified Eustachian tube disorder, right ear: Secondary | ICD-10-CM

## 2020-07-08 DIAGNOSIS — H6981 Other specified disorders of Eustachian tube, right ear: Secondary | ICD-10-CM

## 2020-07-08 NOTE — Telephone Encounter (Signed)
Future visit in 2 weeks  

## 2020-07-20 ENCOUNTER — Other Ambulatory Visit: Payer: Self-pay

## 2020-07-20 DIAGNOSIS — Z8546 Personal history of malignant neoplasm of prostate: Secondary | ICD-10-CM

## 2020-07-20 DIAGNOSIS — D510 Vitamin B12 deficiency anemia due to intrinsic factor deficiency: Secondary | ICD-10-CM

## 2020-07-20 DIAGNOSIS — R7309 Other abnormal glucose: Secondary | ICD-10-CM

## 2020-07-20 DIAGNOSIS — G894 Chronic pain syndrome: Secondary | ICD-10-CM

## 2020-07-20 DIAGNOSIS — I10 Essential (primary) hypertension: Secondary | ICD-10-CM

## 2020-07-20 DIAGNOSIS — E782 Mixed hyperlipidemia: Secondary | ICD-10-CM

## 2020-07-20 DIAGNOSIS — I480 Paroxysmal atrial fibrillation: Secondary | ICD-10-CM

## 2020-07-20 DIAGNOSIS — Z0000 Encounter for general adult medical examination without abnormal findings: Secondary | ICD-10-CM

## 2020-07-21 ENCOUNTER — Other Ambulatory Visit: Payer: Medicare Other

## 2020-07-21 ENCOUNTER — Other Ambulatory Visit: Payer: Self-pay

## 2020-07-22 ENCOUNTER — Encounter: Payer: Self-pay | Admitting: Student in an Organized Health Care Education/Training Program

## 2020-07-22 ENCOUNTER — Ambulatory Visit
Payer: Medicare Other | Attending: Student in an Organized Health Care Education/Training Program | Admitting: Student in an Organized Health Care Education/Training Program

## 2020-07-22 VITALS — BP 148/85 | HR 67 | Temp 97.3°F | Resp 18 | Ht 72.0 in | Wt 370.0 lb

## 2020-07-22 DIAGNOSIS — M19031 Primary osteoarthritis, right wrist: Secondary | ICD-10-CM | POA: Diagnosis present

## 2020-07-22 DIAGNOSIS — M170 Bilateral primary osteoarthritis of knee: Secondary | ICD-10-CM | POA: Diagnosis present

## 2020-07-22 DIAGNOSIS — M25562 Pain in left knee: Secondary | ICD-10-CM | POA: Insufficient documentation

## 2020-07-22 DIAGNOSIS — M19032 Primary osteoarthritis, left wrist: Secondary | ICD-10-CM | POA: Insufficient documentation

## 2020-07-22 DIAGNOSIS — M5416 Radiculopathy, lumbar region: Secondary | ICD-10-CM | POA: Insufficient documentation

## 2020-07-22 DIAGNOSIS — M47816 Spondylosis without myelopathy or radiculopathy, lumbar region: Secondary | ICD-10-CM | POA: Insufficient documentation

## 2020-07-22 DIAGNOSIS — G8929 Other chronic pain: Secondary | ICD-10-CM | POA: Diagnosis present

## 2020-07-22 DIAGNOSIS — G894 Chronic pain syndrome: Secondary | ICD-10-CM | POA: Insufficient documentation

## 2020-07-22 DIAGNOSIS — M25561 Pain in right knee: Secondary | ICD-10-CM | POA: Diagnosis present

## 2020-07-22 LAB — COMPLETE METABOLIC PANEL WITH GFR
AG Ratio: 2.3 (calc) (ref 1.0–2.5)
ALT: 11 U/L (ref 9–46)
AST: 15 U/L (ref 10–35)
Albumin: 4.4 g/dL (ref 3.6–5.1)
Alkaline phosphatase (APISO): 62 U/L (ref 35–144)
BUN: 17 mg/dL (ref 7–25)
CO2: 27 mmol/L (ref 20–32)
Calcium: 9 mg/dL (ref 8.6–10.3)
Chloride: 108 mmol/L (ref 98–110)
Creat: 1.21 mg/dL (ref 0.70–1.25)
GFR, Est African American: 72 mL/min/{1.73_m2} (ref >=60)
GFR, Est Non African American: 62 mL/min/{1.73_m2} (ref >=60)
Globulin: 1.9 g/dL (calc) (ref 1.9–3.7)
Glucose, Bld: 102 mg/dL — ABNORMAL HIGH (ref 65–99)
Potassium: 4.2 mmol/L (ref 3.5–5.3)
Sodium: 143 mmol/L (ref 135–146)
Total Bilirubin: 0.4 mg/dL (ref 0.2–1.2)
Total Protein: 6.3 g/dL (ref 6.1–8.1)

## 2020-07-22 LAB — LIPID PANEL
Cholesterol: 137 mg/dL (ref <200)
HDL: 66 mg/dL (ref >=40)
LDL Cholesterol (Calc): 54 mg/dL (calc)
Non-HDL Cholesterol (Calc): 71 mg/dL (calc) (ref <130)
Total CHOL/HDL Ratio: 2.1 (calc) (ref <5.0)
Triglycerides: 90 mg/dL (ref <150)

## 2020-07-22 LAB — CBC WITH DIFFERENTIAL/PLATELET
Absolute Monocytes: 594 cells/uL (ref 200–950)
Basophils Absolute: 32 cells/uL (ref 0–200)
Basophils Relative: 0.6 %
Eosinophils Absolute: 49 cells/uL (ref 15–500)
Eosinophils Relative: 0.9 %
HCT: 41.8 % (ref 38.5–50.0)
Hemoglobin: 12.8 g/dL — ABNORMAL LOW (ref 13.2–17.1)
Lymphs Abs: 1787 cells/uL (ref 850–3900)
MCH: 25.3 pg — ABNORMAL LOW (ref 27.0–33.0)
MCHC: 30.6 g/dL — ABNORMAL LOW (ref 32.0–36.0)
MCV: 82.8 fL (ref 80.0–100.0)
MPV: 9.9 fL (ref 7.5–12.5)
Monocytes Relative: 11 %
Neutro Abs: 2938 cells/uL (ref 1500–7800)
Neutrophils Relative %: 54.4 %
Platelets: 192 10*3/uL (ref 140–400)
RBC: 5.05 10*6/uL (ref 4.20–5.80)
RDW: 14.9 % (ref 11.0–15.0)
Total Lymphocyte: 33.1 %
WBC: 5.4 10*3/uL (ref 3.8–10.8)

## 2020-07-22 LAB — HEMOGLOBIN A1C
Hgb A1c MFr Bld: 5.7 % of total Hgb — ABNORMAL HIGH (ref <5.7)
Mean Plasma Glucose: 117 mg/dL
eAG (mmol/L): 6.5 mmol/L

## 2020-07-22 LAB — TSH: TSH: 2.38 mIU/L (ref 0.40–4.50)

## 2020-07-22 LAB — PSA: PSA: 0.04 ng/mL (ref <=4.00)

## 2020-07-22 MED ORDER — GABAPENTIN 400 MG PO CAPS: 400.0000 mg | ORAL_CAPSULE | Freq: Three times a day (TID) | ORAL | 5 refills | Status: DC

## 2020-07-22 NOTE — Assessment & Plan Note (Addendum)
[name] has a history of greater than 3 months of moderate to severe pain which is resulted in functional impairment.  The patient has tried various conservative therapeutic options such as NSAIDs, Tylenol, muscle relaxants, physical therapy which was inadequately effective.  Patient's pain is predominantly axial with physical exam findings and imaging findings suggestive of facet arthropathy.  Lumbar facet medial branch nerve blocks were discussed with the patient.  Risks and benefits were reviewed.  Patient would like to proceed with bilateral L3, L4, L5 medial branch nerve block.  Patient instructed to stop Xarelto 3 days prior to scheduled procedure.

## 2020-07-22 NOTE — Progress Notes (Signed)
PROVIDER NOTE: Information contained herein reflects review and annotations entered in association with encounter. Interpretation of such information and data should be left to medically-trained personnel. Information provided to patient can be located elsewhere in the medical record under "Patient Instructions". Document created using STT-dictation technology, any transcriptional errors that may result from process are unintentional.    Patient: [name]  Service Category: E/M  Provider: Gillis Santa, MD  DOB: August 03, 1955  DOS: 07/22/2020  Specialty: Interventional Pain Management  MRN: [id number]  Setting: Ambulatory outpatient  PCP: Olin Hauser, DO  Type: Established Patient    Referring Provider: Nobie Putnam *  Location: Office  Delivery: Face-to-face     HPI  [name], a 65 y.o. year old male, is here today because of his Chronic radicular lumbar pain [M54.16, G89.29]. [name] primary complain today is Back Pain (low) Last encounter: My last encounter with him was on 06/28/2020. Pertinent problems: [name] has H/O gastric bypass; DJD (degenerative joint disease) of knee; Bilateral carpal tunnel syndrome; Paroxysmal atrial fibrillation ([place]); Chronic radicular lumbar pain; Lumbar radiculopathy; Lumbar degenerative disc disease; Lumbar facet arthropathy; Lumbar facet joint syndrome; Primary osteoarthritis of both wrists; Trigger middle finger of left hand; and Chronic pain syndrome on their pertinent problem list. Pain Assessment: Severity of Chronic pain is reported as a 3 /10. Location: Back Lower/radiates down left leg in the back. Onset: More than a month ago. Quality: Constant, Discomfort, Sharp, Aching. Timing: Constant. Modifying factor(s): nothing. Vitals:  height is 6' (1.829 m) and weight is 370 lb (167.8 kg) (abnormal). His temperature is 97.3 F (36.3 C) (abnormal). His blood pressure is 148/85 (abnormal) and his pulse is 67. His respiration is 18  and oxygen saturation is 99%.   Reason for encounter: both, medication management and post-procedure assessment.    Post-Procedure Evaluation  Procedure (06/28/2020):  Type: Therapeutic Inter-Laminar Epidural Steroid Injection  #3 (in 2022) Region: Lumbar Level: L3-4 Level. Laterality: Left-Sided         Effectiveness during initial hour after procedure (Ultra-Short Term Relief): 100 %   Local anesthetic used: Long-acting (4-6 hours) Effectiveness: Defined as any analgesic benefit obtained secondary to the administration of local anesthetics. This carries significant diagnostic value as to the etiological location, or anatomical origin, of the pain. Duration of benefit is expected to coincide with the duration of the local anesthetic used.  Effectiveness during initial 4-6 hours after procedure (Short-Term Relief): 100 %   Long-term benefit: Defined as any relief past the pharmacologic duration of the local anesthetics.  Effectiveness past the initial 6 hours after procedure (Long-Term Relief): 80 % (ongoing)   Benefits, current: Defined as benefit present at the time of this evaluation.   Analgesia:  >75% relief Function: [name] reports improvement in function ROM: [name] reports improvement in ROM   Pharmacotherapy Assessment  Analgesic: Tramadol 50 mg 4 times daily as needed, quantity 120/month; MME equals 20    Monitoring: [place] PMP: PDMP reviewed during this encounter.       Pharmacotherapy: No side-effects or adverse reactions reported. Compliance: No problems identified. Effectiveness: Clinically acceptable.  [name], RN  07/22/2020 10:59 AM  Sign when Signing Visit Nursing Pain Medication Assessment:  Safety precautions to be maintained throughout the outpatient stay will include: orient to surroundings, keep bed in low position, maintain call bell within reach at all times, provide assistance with transfer out of bed and ambulation.  Medication Inspection  Compliance: Pill count conducted under  aseptic conditions, in front of the patient. Neither the pills nor the bottle was removed from the patient's sight at any time. Once count was completed pills were immediately returned to the patient in their original bottle.  Medication: Tramadol (Ultram) Pill/Patch Count:  29 of 120 pills remain Pill/Patch Appearance: Markings consistent with prescribed medication Bottle Appearance: Standard pharmacy container. Clearly labeled. Filled Date: 06 / 02 / 2022 Last Medication intake:  Today      UDS:  Summary  Date Value Ref Range Status  04/06/2020 Note  Final    Comment:    ==================================================================== [employer] Select 13 (MW) ==================================================================== Test                             Result       Flag       Units  Drug Present and Declared for Prescription Verification   Tramadol                       >4386        EXPECTED   ng/mg creat   O-Desmethyltramadol            3353         EXPECTED   ng/mg creat   N-Desmethyltramadol            >4386        EXPECTED   ng/mg creat    Source of tramadol is a prescription medication. O-desmethyltramadol    and N-desmethyltramadol are expected metabolites of tramadol.  ==================================================================== Test                      Result    Flag   Units      Ref Range   Creatinine              114              mg/dL      >=20 ==================================================================== Declared Medications:  The flagging and interpretation on this report are based on the  following declared medications.  Unexpected results may arise from  inaccuracies in the declared medications.   **Note: The testing scope of this panel includes these medications:   Tramadol   **Note: The testing scope of this panel does not include the  following reported medications:   Acetaminophen  (Tylenol)  Amiodarone  Amlodipine  Benazepril  Cyclobenzaprine (Flexeril)  Ezetimibe  Fluticasone  Furosemide  Gabapentin  Menthol  Methyl Salicylate (topical)  Metoprolol  Mirtazapine  Multivitamin  Rivaroxaban  Rosuvastatin  Trazodone  Vitamin B12 ==================================================================== For clinical consultation, please call [phone]. ====================================================================      ROS  Constitutional: Denies any fever or chills Gastrointestinal: No reported hemesis, hematochezia, vomiting, or acute GI distress Musculoskeletal:  Low back pain, bandlike pattern Neurological: No reported episodes of acute onset apraxia, aphasia, dysarthria, agnosia, amnesia, paralysis, loss of coordination, or loss of consciousness  Medication Review  Salonpas Pain Relief Patch, acetaminophen, amLODipine, amiodarone, benazepril, cyanocobalamin, cyclobenzaprine, ezetimibe, fluticasone, furosemide, gabapentin, ibuprofen, metoprolol succinate, multivitamin with minerals, rivaroxaban, rosuvastatin, traMADol, and traZODone  History Review  Allergy: [name] has No Known Allergies. Drug: [name]  reports no history of drug use. Alcohol:  reports current alcohol use of about 12.0 standard drinks of alcohol per week. Tobacco:  reports that he quit smoking about 41 years ago. His smoking use included cigarettes. He has a 10.00 pack-year  smoking history. He has quit using smokeless tobacco.  His smokeless tobacco use included snuff. Social: [name]  reports that he quit smoking about 41 years ago. His smoking use included cigarettes. He has a 10.00 pack-year smoking history. He has quit using smokeless tobacco.  His smokeless tobacco use included snuff. He reports current alcohol use of about 12.0 standard drinks of alcohol per week. He reports that he does not use drugs. Medical:  has a past medical history of Cardiomyopathy (in  setting of Afib), DJD (degenerative joint disease) of knee, History of kidney stones, Intervertebral disc disorder with radiculopathy of lumbosacral region, Kidney stone (2012), Lower extremity edema, PAF (paroxysmal atrial fibrillation) ([place]), Pernicious anemia, Prostate cancer ([place]), Pulmonary nodule, right, and SVT (supraventricular tachycardia) ([place]). Surgical: [name]  has a past surgical history that includes Knee surgery; Gastric bypass (2002); prostate seeding; Carpal tunnel release (Right, 12/23/2014); Carpal tunnel release (Left, 01/06/2015); Colonoscopy with propofol (N/A, 10/08/2017); and Cardioversion (N/A, 05/15/2019). Family: family history includes Arrhythmia in his brother and sister; Cancer in his father; Heart disease in his father; Hypertension in his father; Stroke in his father.  Laboratory Chemistry Profile   Renal Lab Results  Component Value Date   BUN 17 07/21/2020   CREATININE 1.21 23/36/1224   BCR NOT APPLICABLE 49/75/3005   GFRAA 72 07/21/2020   GFRNONAA 62 07/21/2020    Hepatic Lab Results  Component Value Date   AST 15 07/21/2020   ALT 11 07/21/2020   ALBUMIN 4.6 08/01/2019   ALKPHOS 72 08/01/2019   LIPASE 28 03/13/2017    Electrolytes Lab Results  Component Value Date   NA 143 07/21/2020   K 4.2 07/21/2020   CL 108 07/21/2020   CALCIUM 9.0 07/21/2020   MG 2.0 05/05/2019    Bone No results found for: VD25OH, VD125OH2TOT, RT0211ZN3, VA7014DC3, 25OHVITD1, 25OHVITD2, 25OHVITD3, TESTOFREE, TESTOSTERONE  Inflammation (CRP: Acute Phase) (ESR: Chronic Phase) No results found for: CRP, ESRSEDRATE, LATICACIDVEN        Note: Above Lab results reviewed.  Physical Exam  General appearance: Well nourished, well developed, and well hydrated. In no apparent acute distress Mental status: Alert, oriented x 3 (person, place, & time)       Respiratory: No evidence of acute respiratory distress Eyes: PERLA Vitals: BP (!) 148/85   Pulse 67   Temp (!) 97.3 F  (36.3 C)   Resp 18   Ht 6' (1.829 m)   Wt (!) 370 lb (167.8 kg)   SpO2 99%   BMI 50.18 kg/m  BMI: Estimated body mass index is 50.18 kg/m as calculated from the following:   Height as of this encounter: 6' (1.829 m).   Weight as of this encounter: 370 lb (167.8 kg). Ideal: Ideal body weight: 77.6 kg (171 lb 1.2 oz) Adjusted ideal body weight: 113.7 kg (250 lb 10.3 oz)  Lumbar Spine Area Exam  Skin & Axial Inspection: No masses, redness, or swelling Alignment: Symmetrical Functional ROM: Pain restricted ROM       Stability: No instability detected Muscle Tone/Strength: Functionally intact. No obvious neuro-muscular anomalies detected. Sensory (Neurological): Musculoskeletal pain pattern Palpation: No palpable anomalies       Provocative Tests: Hyperextension/rotation test: (+) bilaterally for facet joint pain. Lumbar quadrant test (Kemp's test): (+) bilaterally for facet joint pain. Gait & Posture Assessment  Ambulation: Unassisted Gait: Relatively normal for age and body habitus Posture: WNL  Lower Extremity Exam    Side: Right lower extremity  Side: Left lower extremity  Stability: No instability observed          Stability: No instability observed          Skin & Extremity Inspection: Skin color, temperature, and hair growth are WNL. No peripheral edema or cyanosis. No masses, redness, swelling, asymmetry, or associated skin lesions. No contractures.  Skin & Extremity Inspection: Skin color, temperature, and hair growth are WNL. No peripheral edema or cyanosis. No masses, redness, swelling, asymmetry, or associated skin lesions. No contractures.  Functional ROM: Pain restricted ROM for hip and knee joints          Functional ROM: Pain restricted ROM for hip and knee joints          Muscle Tone/Strength: Functionally intact. No obvious neuro-muscular anomalies detected.  Muscle Tone/Strength: Functionally intact. No obvious neuro-muscular anomalies detected.  Sensory  (Neurological): Unimpaired        Sensory (Neurological): Unimpaired        DTR: Patellar: deferred today Achilles: deferred today Plantar: deferred today  DTR: Patellar: deferred today Achilles: deferred today Plantar: deferred today  Palpation: No palpable anomalies  Palpation: No palpable anomalies    Assessment   Status Diagnosis  Improving Having a Flare-up Improving 1. Chronic radicular lumbar pain   2. Lumbar facet joint syndrome   3. Lumbar radiculopathy   4. Chronic pain syndrome   5. Bilateral primary osteoarthritis of knee   6. Chronic pain of both knees   7. Primary osteoarthritis of both wrists (Right)   8. Lumbar facet arthropathy      Updated Problems: Problem  Lumbar Radiculopathy  Lumbar Facet Joint Syndrome    Plan of Care  Problem-specific:  Lumbar radiculopathy Positive analgesic and functional response every 3-4 months with lumbar epidural steroid injections for lumbar radicular pain flare.  Has found improvement in radiating leg pain since his last epidural.  Repeat as needed  Lumbar facet joint syndrome [name] has a history of greater than 3 months of moderate to severe pain which is resulted in functional impairment.  The patient has tried various conservative therapeutic options such as NSAIDs, Tylenol, muscle relaxants, physical therapy which was inadequately effective.  Patient's pain is predominantly axial with physical exam findings and imaging findings suggestive of facet arthropathy.  Lumbar facet medial branch nerve blocks were discussed with the patient.  Risks and benefits were reviewed.  Patient would like to proceed with bilateral L3, L4, L5 medial branch nerve block.  Patient instructed to stop Xarelto 3 days prior to scheduled procedure.  [name] has a current medication list which includes the following long-term medication(s): amiodarone, amlodipine, benazepril, ezetimibe, fluticasone, furosemide, metoprolol succinate,  rivaroxaban, rosuvastatin, trazodone, and gabapentin.  Pharmacotherapy (Medications Ordered): Meds ordered this encounter  Medications   gabapentin (NEURONTIN) 400 MG capsule    Sig: Take 1 capsule (400 mg total) by mouth 3 (three) times daily.    Dispense:  90 capsule    Refill:  5  Continue tramadol as prescribed, no refill needed.  Orders:  Orders Placed This Encounter  Procedures   LUMBAR FACET(MEDIAL BRANCH NERVE BLOCK) MBNB    Standing Status:   Future    Standing Expiration Date:   08/22/2020    Scheduling Instructions:     Procedure: Lumbar facet block (AKA.: Lumbosacral medial branch nerve block)     Side: Bilateral     Level: L3-4, L4-5, Facets ( L3, L4, L5, Medial Branch Nerves)     Sedation: with  Timeframe: ASAA    Order Specific Question:   Where will this procedure be performed?    Answer:   [facility]   Follow-up plan:   Return in about 26 days (around 08/17/2020) for B/L L3,4,5 Facet with IV sedation (stop Xarelto 3 days prior).     s/p lesi #1 (left L4/5) on 8/17, #2 on 10/21/2018 (left L3/4), #3 on 12/09/2018 (Left L3/4); 02/24/2019: Right wrist injection and left trigger finger (middle finger) injection.,  Bilateral wrist injection 10/15/2019, bilateral knee Hyalgan No. 1 01/26/1960. #2 02/23/2020, #3 03/24/2020.  Endorsing approximately 75 to 80% pain relief for bilateral knee pain.      Recent Visits Date Type Provider Dept  06/28/20 Procedure visit Gillis Santa, MD [facility]  06/07/20 Procedure visit Gillis Santa, MD [facility]  05/19/20 Procedure visit Gillis Santa, MD [facility]  05/05/20 Procedure visit Gillis Santa, MD [facility]  Showing recent visits within past 90 days and meeting all other requirements Today's Visits Date Type Provider Dept  07/22/20 Office Visit Gillis Santa, MD [facility]  Showing today's visits and meeting all other requirements Future Appointments Date Type  Provider Dept  08/16/20 Appointment Gillis Santa, MD [facility]  Showing future appointments within next 90 days and meeting all other requirements I discussed the assessment and treatment plan with the patient. The patient was provided an opportunity to ask questions and all were answered. The patient agreed with the plan and demonstrated an understanding of the instructions.  Patient advised to call back or seek an in-person evaluation if the symptoms or condition worsens.  Duration of encounter: 30 minutes.  Note by: Gillis Santa, MD Date: 07/22/2020; Time: 11:25 AM

## 2020-07-22 NOTE — Patient Instructions (Signed)
   Stop xarelto for 3 days prior to procedure    Preparing for Procedure with Sedation Instructions: Oral Intake: Do not eat or drink anything for at least 8 hours prior to your procedure. Transportation: Public transportation is not allowed. Bring an adult driver. The driver must be physically present in our waiting room before any procedure can be started. Physical Assistance: Bring an adult capable of physically assisting you, in the event you need help. Blood Pressure Medicine: Take your blood pressure medicine with a sip of water the morning of the procedure. Insulin: Take only  of your normal insulin dose. Preventing infections: Shower with an antibacterial soap the morning of your procedure. Build-up your immune system: Take 1000 mg of Vitamin C with every meal (3 times a day) the day prior to your procedure. Pregnancy: If you are pregnant, call and cancel the procedure. Sickness: If you have a cold, fever, or any active infections, call and cancel the procedure. Arrival: You must be in the facility at least 30 minutes prior to your scheduled procedure. Children: Do not bring children with you. Dress appropriately: Bring dark clothing that you would not mind if they get stained. Valuables: Do not bring any jewelry or valuables. Procedure appointments are reserved for interventional treatments only. No Prescription Refills. No medication changes will be discussed during procedure appointments. No disability issues will be discussed.

## 2020-07-22 NOTE — Progress Notes (Signed)
Nursing Pain Medication Assessment:  Safety precautions to be maintained throughout the outpatient stay will include: orient to surroundings, keep bed in low position, maintain call bell within reach at all times, provide assistance with transfer out of bed and ambulation.  Medication Inspection Compliance: Pill count conducted under aseptic conditions, in front of the patient. Neither the pills nor the bottle was removed from the patient's sight at any time. Once count was completed pills were immediately returned to the patient in their original bottle.  Medication: Tramadol (Ultram) Pill/Patch Count:  29 of 120 pills remain Pill/Patch Appearance: Markings consistent with prescribed medication Bottle Appearance: Standard pharmacy container. Clearly labeled. Filled Date: 06 / 02 / 2022 Last Medication intake:  Today

## 2020-07-22 NOTE — Assessment & Plan Note (Signed)
Positive analgesic and functional response every 3-4 months with lumbar epidural steroid injections for lumbar radicular pain flare.  Has found improvement in radiating leg pain since his last epidural.  Repeat as needed

## 2020-07-26 ENCOUNTER — Other Ambulatory Visit: Payer: Self-pay

## 2020-07-26 ENCOUNTER — Other Ambulatory Visit: Payer: Self-pay | Admitting: Family Medicine

## 2020-07-26 ENCOUNTER — Encounter: Payer: Self-pay | Admitting: Family Medicine

## 2020-07-26 ENCOUNTER — Ambulatory Visit (INDEPENDENT_AMBULATORY_CARE_PROVIDER_SITE_OTHER): Payer: Medicare Other | Admitting: Family Medicine

## 2020-07-26 VITALS — BP 128/57 | HR 51 | Ht 72.0 in | Wt 355.0 lb

## 2020-07-26 DIAGNOSIS — C61 Malignant neoplasm of prostate: Secondary | ICD-10-CM | POA: Insufficient documentation

## 2020-07-26 DIAGNOSIS — E782 Mixed hyperlipidemia: Secondary | ICD-10-CM | POA: Diagnosis not present

## 2020-07-26 DIAGNOSIS — M1731 Unilateral post-traumatic osteoarthritis, right knee: Secondary | ICD-10-CM

## 2020-07-26 DIAGNOSIS — I10 Essential (primary) hypertension: Secondary | ICD-10-CM

## 2020-07-26 DIAGNOSIS — R7309 Other abnormal glucose: Secondary | ICD-10-CM

## 2020-07-26 DIAGNOSIS — G894 Chronic pain syndrome: Secondary | ICD-10-CM

## 2020-07-26 DIAGNOSIS — G4700 Insomnia, unspecified: Secondary | ICD-10-CM

## 2020-07-26 DIAGNOSIS — I480 Paroxysmal atrial fibrillation: Secondary | ICD-10-CM

## 2020-07-26 DIAGNOSIS — Z23 Encounter for immunization: Secondary | ICD-10-CM

## 2020-07-26 DIAGNOSIS — Z6841 Body Mass Index (BMI) 40.0 and over, adult: Secondary | ICD-10-CM

## 2020-07-26 MED ORDER — SHINGRIX 50 MCG/0.5ML IM SUSR: INTRAMUSCULAR | 1 refills | Status: DC

## 2020-07-26 MED ORDER — TRAZODONE HCL 150 MG PO TABS
150.0000 mg | ORAL_TABLET | Freq: Every day | ORAL | 3 refills | Status: DC
Start: 2020-07-26 — End: 2021-10-24

## 2020-07-26 NOTE — Assessment & Plan Note (Signed)
S/p brachytherapy per Dr [facility] Has had < 0.1 PSA in past Last lab PSA 0.04 Follow up as planned

## 2020-07-26 NOTE — Progress Notes (Signed)
Subjective:    Patient ID: [name], male    DOB: January 23, 1955, 65 y.o.   MRN: [id number]  [name] is a 65 y.o. male presenting on 07/26/2020 for Obesity   HPI  Here for Yearly Check up and Lab Review.   Morbid Obesity BMI >48 Hypertension Paroxsymal Atrial Fibrillation Followed by Dr Rockey Situ, [facility] Card[facility]  Elevated A1c Last lab 5.7 A1c, no prior Dramatic weight loss improvement down 20-25 lbs. With good improvement now eating at home more and portion control.  Hyperlipidemia On Rosuvastatin. Doing well.  Insomnia Chronic problem, Improved on higher dose Trazodone. Improved now sleeping through the night on higher dose 150mg  nightly.   Chronic Pain Syndrome Osteoarthritis, bilateral knees other joints Followed by Dr Holley Raring, chronic pain management, has had knee injections with lubricant, he is hoping to come off of pain medication in future.   History of Prostate Cancer Previously seen by Dr Maryan Puls Urology - treated approx 2017 by seed implant, has had negative PSA since that time and done well, no longer followed by Urology. Last PSA 0.04, negative.  History of Anemia He is off iron supplement, hemoglobin mildly low this time on lab. On MVI  Upcoming apt with Dr Rockey Situ [facility] on Wednesday.  Followed by Dr Holley Raring [facility] - last seen 07/22/20. He is doing well.    Health Maintenance: Last Colonoscopy 10/12/17 Dr Sherri Sear - multiple polyp, repeat in 5 years, approx 2024  COVID Booster, declines.  Considering Shingrix vaccine.  Depression screen [facility] 2/9 07/22/2020 01/08/2020 09/23/2019  Decreased Interest 0 0 0  Down, Depressed, Hopeless 0 0 0  PHQ - 2 Score 0 0 0  Altered sleeping - - -  Tired, decreased energy - - -  Change in appetite - - -  Feeling bad or failure about yourself  - - -  Trouble concentrating - - -  Moving slowly or fidgety/restless - - -  Suicidal thoughts - - -  PHQ-9 Score - - -    Past Medical  History:  Diagnosis Date   Cardiomyopathy (in setting of Afib)    a. 12/2013 Echo: EF 45-50%, mild ant and antsept HK. mild MR. Mod dil LA. nl RV fxn. Rhythm was Afib.   DJD (degenerative joint disease) of knee    History of kidney stones    Intervertebral disc disorder with radiculopathy of lumbosacral region    Kidney stone 2012   Lower extremity edema    PAF (paroxysmal atrial fibrillation) (HCC)    Pernicious anemia    Prostate cancer ([place])    Pulmonary nodule, right    a. 10/2014 Cardiac CTA: 26mm RLL nodule; b. 04/2015 CT Chest: stable 15mm RLL nodule. No new nodules; 02/2017 CTA Chest: stable, benign, 42mm RLL pulm nodule.   SVT (supraventricular tachycardia) [facility])    Past Surgical History:  Procedure Laterality Date   CARDIOVERSION N/A 05/15/2019   Procedure: CARDIOVERSION;  Surgeon: Minna Merritts, MD;  Location: [facility] ORS;  Service: Cardiovascular;  Laterality: N/A;   CARPAL TUNNEL RELEASE Right 12/23/2014   Procedure: CARPAL TUNNEL RELEASE;  Surgeon: Earnestine Leys, MD;  Location: [facility] ORS;  Service: Orthopedics;  Laterality: Right;   CARPAL TUNNEL RELEASE Left 01/06/2015   Procedure: CARPAL TUNNEL RELEASE;  Surgeon: Earnestine Leys, MD;  Location: [facility] ORS;  Service: Orthopedics;  Laterality: Left;   COLONOSCOPY WITH PROPOFOL N/A 10/08/2017   Procedure: COLONOSCOPY WITH PROPOFOL;  Surgeon: Lin Landsman, MD;  Location: [facility]  ENDOSCOPY;  Service: Gastroenterology;  Laterality: N/A;   GASTRIC BYPASS  2002   KNEE SURGERY     knee trauma   prostate seeding     Social History   Socioeconomic History   Marital status: Married    Spouse name: Not on file   Number of children: Not on file   Years of education: Not on file   Highest education level: Not on file  Occupational History   Not on file  Tobacco Use   Smoking status: Former    Packs/day: 1.00    Years: 10.00    Pack years: 10.00    Types: Cigarettes    Quit date: 02/18/1979    Years since quitting: 41.4    Smokeless tobacco: Former    Types: Snuff  Vaping Use   Vaping Use: Never used  Substance and Sexual Activity   Alcohol use: Yes    Alcohol/week: 12.0 standard drinks    Types: 12 Cans of beer per week    Comment: weekly   Drug use: No   Sexual activity: Not on file  Other Topics Concern   Not on file  Social History Narrative   Not on file   Social Determinants of Health   Financial Resource Strain: Not on file  Food Insecurity: Not on file  Transportation Needs: Not on file  Physical Activity: Not on file  Stress: Not on file  Social Connections: Not on file  Intimate Partner Violence: Not on file   Family History  Problem Relation Age of Onset   Heart disease Father    Hypertension Father    Stroke Father    Cancer Father    Arrhythmia Sister        A-fib   Arrhythmia Brother        A-fib   Current Outpatient Medications on File Prior to Visit  Medication Sig   acetaminophen (TYLENOL) 500 MG tablet Take 1,000 mg by mouth every 6 (six) hours as needed for moderate pain or headache.   amiodarone (PACERONE) 200 MG tablet Take 1 tablet (200 mg total) by mouth daily.   amLODipine (NORVASC) 5 MG tablet Take 1 tablet (5 mg total) by mouth daily.   benazepril (LOTENSIN) 40 MG tablet TAKE 1 TABLET(40 MG) BY MOUTH DAILY   cyanocobalamin (,VITAMIN B-12,) 1000 MCG/ML injection INJECT 1 ML IN THE MUSCLE EVERY 30 DAYS   cyclobenzaprine (FLEXERIL) 10 MG tablet TAKE 1 TABLET(10 MG) BY MOUTH THREE TIMES DAILY AS NEEDED FOR MUSCLE SPASMS   ezetimibe (ZETIA) 10 MG tablet TAKE 1 TABLET(10 MG) BY MOUTH DAILY   fluticasone (FLONASE) 50 MCG/ACT nasal spray USE 2 SPRAYS IN EACH NOSTRIL DAILY   furosemide (LASIX) 20 MG tablet Take 1 tablet (20 mg total) by mouth daily as needed.   gabapentin (NEURONTIN) 400 MG capsule Take 1 capsule (400 mg total) by mouth 3 (three) times daily.   ibuprofen (ADVIL) 200 MG tablet Take 200 mg by mouth daily.   metoprolol succinate (TOPROL-XL) 50 MG 24 hr  tablet TAKE 1 TABLET(50 MG) BY MOUTH DAILY WITH OR IMMEDIATELY FOLLOWING A MEAL   Multiple Vitamin (MULTIVITAMIN WITH MINERALS) TABS tablet Take 1 tablet by mouth daily.   rivaroxaban (XARELTO) 20 MG TABS tablet Take 1 tablet (20 mg total) by mouth daily with supper.   rosuvastatin (CRESTOR) 10 MG tablet TAKE 1 TABLET(10 MG) BY MOUTH AT BEDTIME   traMADol (ULTRAM) 50 MG tablet Take 1 tablet (50 mg total) by mouth every 6 (  six) hours as needed.   No current facility-administered medications on file prior to visit.    Review of Systems  Constitutional:  Negative for activity change, appetite change, chills, diaphoresis, fatigue and fever.  HENT:  Negative for congestion and hearing loss.   Eyes:  Negative for visual disturbance.  Respiratory:  Negative for cough, chest tightness, shortness of breath and wheezing.   Cardiovascular:  Negative for chest pain, palpitations and leg swelling.  Gastrointestinal:  Negative for abdominal pain, constipation, diarrhea, nausea and vomiting.  Genitourinary:  Negative for dysuria, frequency and hematuria.  Musculoskeletal:  Negative for arthralgias and neck pain.  Skin:  Negative for rash.  Neurological:  Negative for dizziness, weakness, light-headedness, numbness and headaches.  Hematological:  Negative for adenopathy.  Psychiatric/Behavioral:  Negative for behavioral problems, dysphoric mood and sleep disturbance.   Per HPI unless specifically indicated above    Objective:    BP (!) 128/57   Pulse (!) 51   Ht 6' (1.829 m)   Wt (!) 355 lb (161 kg)   SpO2 97%   BMI 48.15 kg/m   Wt Readings from Last 3 Encounters:  07/26/20 (!) 355 lb (161 kg)  07/22/20 (!) 370 lb (167.8 kg)  06/28/20 (!) 370 lb (167.8 kg)    Physical Exam Vitals and nursing note reviewed.  Constitutional:      General: He is not in acute distress.    Appearance: He is well-developed. He is obese. He is not diaphoretic.     Comments: Well-appearing, comfortable,  cooperative  HENT:     Head: Normocephalic and atraumatic.  Eyes:     General:        Right eye: No discharge.        Left eye: No discharge.     Conjunctiva/sclera: Conjunctivae normal.     Pupils: Pupils are equal, round, and reactive to light.  Neck:     Thyroid: No thyromegaly.     Vascular: No carotid bruit.  Cardiovascular:     Rate and Rhythm: Normal rate and regular rhythm.     Pulses: Normal pulses.     Heart sounds: Normal heart sounds. No murmur heard. Pulmonary:     Effort: Pulmonary effort is normal. No respiratory distress.     Breath sounds: Normal breath sounds. No wheezing or rales.  Abdominal:     General: Bowel sounds are normal. There is no distension.     Palpations: Abdomen is soft. There is no mass.     Tenderness: There is no abdominal tenderness.  Musculoskeletal:        General: No tenderness. Normal range of motion.     Cervical back: Normal range of motion and neck supple.     Right lower leg: No edema.     Left lower leg: No edema.     Comments: Upper / Lower Extremities: - Normal muscle tone, strength bilateral upper extremities 5/5, lower extremities 5/5  Lymphadenopathy:     Cervical: No cervical adenopathy.  Skin:    General: Skin is warm and dry.     Findings: No erythema or rash.  Neurological:     Mental Status: He is alert and oriented to person, place, and time.     Comments: Distal sensation intact to light touch all extremities  Psychiatric:        Mood and Affect: Mood normal.        Behavior: Behavior normal.        Thought Content: Thought content  normal.     Comments: Well groomed, good eye contact, normal speech and thoughts   Results for orders placed or performed in visit on 07/20/20  TSH  Result Value Ref Range   TSH 2.38 0.40 - 4.50 mIU/L  PSA  Result Value Ref Range   PSA 0.04 < OR = 4.00 ng/mL  Lipid panel  Result Value Ref Range   Cholesterol 137 <200 mg/dL   HDL 66 > OR = 40 mg/dL   Triglycerides 90 <150 mg/dL    LDL Cholesterol (Calc) 54 mg/dL (calc)   Total CHOL/HDL Ratio 2.1 <5.0 (calc)   Non-HDL Cholesterol (Calc) 71 <130 mg/dL (calc)  COMPLETE METABOLIC PANEL WITH GFR  Result Value Ref Range   Glucose, Bld 102 (H) 65 - 99 mg/dL   BUN 17 7 - 25 mg/dL   Creat 1.21 0.70 - 1.25 mg/dL   GFR, Est Non African American 62 > OR = 60 mL/min/1.60m2   GFR, Est African American 72 > OR = 60 mL/min/1.54m2   BUN/Creatinine Ratio NOT APPLICABLE 6 - 22 (calc)   Sodium 143 135 - 146 mmol/L   Potassium 4.2 3.5 - 5.3 mmol/L   Chloride 108 98 - 110 mmol/L   CO2 27 20 - 32 mmol/L   Calcium 9.0 8.6 - 10.3 mg/dL   Total Protein 6.3 6.1 - 8.1 g/dL   Albumin 4.4 3.6 - 5.1 g/dL   Globulin 1.9 1.9 - 3.7 g/dL (calc)   AG Ratio 2.3 1.0 - 2.5 (calc)   Total Bilirubin 0.4 0.2 - 1.2 mg/dL   Alkaline phosphatase (APISO) 62 35 - 144 U/L   AST 15 10 - 35 U/L   ALT 11 9 - 46 U/L  CBC with Differential/Platelet  Result Value Ref Range   WBC 5.4 3.8 - 10.8 Thousand/uL   RBC 5.05 4.20 - 5.80 Million/uL   Hemoglobin 12.8 (L) 13.2 - 17.1 g/dL   HCT 41.8 38.5 - 50.0 %   MCV 82.8 80.0 - 100.0 fL   MCH 25.3 (L) 27.0 - 33.0 pg   MCHC 30.6 (L) 32.0 - 36.0 g/dL   RDW 14.9 11.0 - 15.0 %   Platelets 192 140 - 400 Thousand/uL   MPV 9.9 7.5 - 12.5 fL   Neutro Abs 2,938 1,500 - 7,800 cells/uL   Lymphs Abs 1,787 850 - 3,900 cells/uL   Absolute Monocytes 594 200 - 950 cells/uL   Eosinophils Absolute 49 15 - 500 cells/uL   Basophils Absolute 32 0 - 200 cells/uL   Neutrophils Relative % 54.4 %   Total Lymphocyte 33.1 %   Monocytes Relative 11.0 %   Eosinophils Relative 0.9 %   Basophils Relative 0.6 %  Hemoglobin A1c  Result Value Ref Range   Hgb A1c MFr Bld 5.7 (H) <5.7 % of total Hgb   Mean Plasma Glucose 117 mg/dL   eAG (mmol/L) 6.5 mmol/L      Assessment & Plan:   Problem List Items Addressed This Visit     Prostate cancer [facility])    S/p brachytherapy per Dr Yves Dill Urology Has had < 0.1 PSA in past Last lab PSA  0.04 Follow up as planned       Paroxysmal atrial fibrillation ([place])    Managed on medication by Cardiology       Morbid obesity with BMI of 50.0-59.9, adult ([place]) - Primary   Insomnia    Stable chronic problem now remains only on Trazodone Continues Trazodone 150mg  nightly add refills  Relevant Medications   traZODone (DESYREL) 150 MG tablet   Hyperlipidemia    Controlled cholesterol on statin lifestyle Last lipid panel 07/2020  Plan: 1. Continue current meds - Rosuvastatin 2. Encourage improved lifestyle - low carb/cholesterol, reduce portion size, continue improving regular exercise       DJD (degenerative joint disease) of knee   Chronic pain syndrome    Managed by Dr Holley Raring [facility]       Relevant Medications   traZODone (DESYREL) 150 MG tablet   Other Visit Diagnoses     Need for shingles vaccine       Relevant Medications   SHINGRIX injection       Updated Health Maintenance information Due for shingrix vaccine, will print and give to patient for pharmacy Declines covid booster PSA 0.04 negative, follow history of prostate cancer Reviewed recent lab results with patient Encouraged improvement to lifestyle with diet and exercise Goal of weight loss   Meds ordered this encounter  Medications   traZODone (DESYREL) 150 MG tablet    Sig: Take 1 tablet (150 mg total) by mouth at bedtime.    Dispense:  90 tablet    Refill:  3    Add refills on file   SHINGRIX injection    Sig: Inject 0.5 mL into muscle for shingles vaccine. Repeat dose in 2-6 months.    Dispense:  0.5 mL    Refill:  1      Follow up plan: Return in about 1 year (around 07/26/2021) for 1 year fasting lab only then 1 week later [employer].  Future labs ordered for 07/27/21  Nobie Putnam, [facility] 07/26/2020, 10:55 AM

## 2020-07-26 NOTE — Assessment & Plan Note (Signed)
Controlled cholesterol on statin lifestyle Last lipid panel 07/2020  Plan: 1. Continue current meds - Rosuvastatin 2. Encourage improved lifestyle - low carb/cholesterol, reduce portion size, continue improving regular exercise

## 2020-07-26 NOTE — Assessment & Plan Note (Signed)
Stable chronic problem now remains only on Trazodone Continues Trazodone 150mg  nightly add refills

## 2020-07-26 NOTE — Assessment & Plan Note (Signed)
Managed by Dr Holley Raring [facility] Pain Management

## 2020-07-26 NOTE — Assessment & Plan Note (Signed)
Managed on medication by Cardiology

## 2020-07-26 NOTE — Patient Instructions (Addendum)
Thank you for coming to the office today.  Shingles vaccine from pharmacy 2-6 month apart.  Normal PSA range, keep eye on it yearly.  Refilled Trazodone.  Recent Labs    07/21/20 0856  HGBA1C 5.7*    Eat at least 3 meals and 1-2 snacks per day (don't skip breakfast).  Aim for no more than 5 hours between eating. - Tip: If you go >5 hours without eating and become very hungry, your body will supply it's own resources temporarily and you can gain extra weight when you eat.   Diet Recommendations for Preventing Diabetes   Reduce Starchy (carb) foods include: Bread, rice, pasta, potatoes, corn, crackers, bagels, muffins, all baked goods.   Protein foods include: Meat, fish, poultry, eggs, dairy foods, and beans such as pinto and kidney beans (beans also provide carbohydrate).   1. Eat at least 3 meals and 1-2 snacks per day. Never go more than 4-5 hours while awake without eating.   2. Limit starchy foods to TWO per meal and ONE per snack. ONE portion of a starchy  food is equal to the following:   - ONE slice of bread (or its equivalent, such as half of a hamburger bun).   - 1/2 cup of a "scoopable" starchy food such as potatoes or rice.   - 1 OUNCE (28 grams) of starchy snacks (crackers or pretzels, look on label).   - 15 grams of carbohydrate as shown on food label.   3. Both lunch and dinner should include a protein food, a carb food, and vegetables.   - Obtain twice as many veg's as protein or carbohydrate foods for both lunch and dinner.   - Try to keep frozen veg's on hand for a quick vegetable serving.     - Fresh or frozen veg's are best.   4. Breakfast should always include protein.    DUE for FASTING BLOOD WORK (no food or drink after midnight before the lab appointment, only water or coffee without cream/sugar on the morning of)  SCHEDULE "Lab Only" visit in the morning at the clinic for lab draw in 1 YEAR  - Make sure Lab Only appointment is at about 1 week before  your next appointment, so that results will be available  For Lab Results, once available within 2-3 days of blood draw, you can can log in to MyChart online to view your results and a brief explanation. Also, we can discuss results at next follow-up visit.   Please schedule a Follow-up Appointment to: Return in about 1 year (around 07/26/2021) for 1 year fasting lab only then 1 week later [employer].  If you have any other questions or concerns, please feel free to call the office or send a message through [facility]. You may also schedule an earlier appointment if necessary.  Additionally, you may be receiving a survey about your experience at our office within a few days to 1 week by e-mail or mail. We value your feedback.  Nobie Putnam, DO [place]

## 2020-07-28 ENCOUNTER — Ambulatory Visit (INDEPENDENT_AMBULATORY_CARE_PROVIDER_SITE_OTHER): Payer: Medicare Other | Admitting: Cardiovascular Disease

## 2020-07-28 ENCOUNTER — Other Ambulatory Visit: Payer: Self-pay

## 2020-07-28 ENCOUNTER — Encounter: Payer: Self-pay | Admitting: Cardiovascular Disease

## 2020-07-28 VITALS — BP 112/62 | HR 48 | Ht 72.0 in | Wt 355.0 lb

## 2020-07-28 DIAGNOSIS — I2584 Coronary atherosclerosis due to calcified coronary lesion: Secondary | ICD-10-CM | POA: Diagnosis not present

## 2020-07-28 DIAGNOSIS — I2510 Atherosclerotic heart disease of native coronary artery without angina pectoris: Secondary | ICD-10-CM

## 2020-07-28 DIAGNOSIS — E782 Mixed hyperlipidemia: Secondary | ICD-10-CM

## 2020-07-28 DIAGNOSIS — I4819 Other persistent atrial fibrillation: Secondary | ICD-10-CM | POA: Diagnosis not present

## 2020-07-28 DIAGNOSIS — I519 Heart disease, unspecified: Secondary | ICD-10-CM

## 2020-07-28 DIAGNOSIS — I10 Essential (primary) hypertension: Secondary | ICD-10-CM | POA: Diagnosis not present
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
## 2020-07-28 NOTE — Patient Instructions (Addendum)
Medication Instructions:  Please stop the amlodipine, blood pressure is dropping  If heart rate and blood pressure continue to run low, We may need to cut the metoprolol down or benazepril down   If you need a refill on your cardiac medications before your next appointment, please call your pharmacy.    Lab work: No new labs needed  Testing/Procedures: No new testing needed   Follow-Up: At [facility], you and your health needs are our priority.  As part of our continuing mission to provide you with exceptional heart care, we have created designated Provider Care Teams.  These Care Teams include your primary Cardiologist (physician) and Advanced Practice Providers (APPs -  Physician Assistants and Nurse Practitioners) who all work together to provide you with the care you need, when you need it.  You will need a follow up appointment in 12 months  Providers on your designated Care Team:   Murray Hodgkins, NP Christell Faith, PA-C Marrianne Mood, PA-C [place], [place]  COVID-19 Vaccine Information can be found at: [email or web address] For questions related to vaccine distribution or appointments, please email [email or web address] or call [phone].

## 2020-07-29 ENCOUNTER — Encounter: Payer: Medicare Other | Admitting: Student in an Organized Health Care Education/Training Program

## 2020-08-16 ENCOUNTER — Ambulatory Visit (HOSPITAL_BASED_OUTPATIENT_CLINIC_OR_DEPARTMENT_OTHER): Payer: Medicare Other | Admitting: Student in an Organized Health Care Education/Training Program

## 2020-08-16 ENCOUNTER — Ambulatory Visit
Admission: RE | Admit: 2020-08-16 | Discharge: 2020-08-16 | Disposition: A | Discharge status: Home / Self Care | Payer: Medicare Other | Source: Ambulatory Visit | Attending: Student in an Organized Health Care Education/Training Program | Admitting: Student in an Organized Health Care Education/Training Program

## 2020-08-16 ENCOUNTER — Other Ambulatory Visit: Payer: Self-pay

## 2020-08-16 VITALS — BP 103/60 | HR 50 | Temp 97.0°F | Resp 16 | Ht 72.0 in | Wt 350.0 lb

## 2020-08-16 DIAGNOSIS — G894 Chronic pain syndrome: Secondary | ICD-10-CM | POA: Diagnosis present

## 2020-08-16 DIAGNOSIS — M47816 Spondylosis without myelopathy or radiculopathy, lumbar region: Secondary | ICD-10-CM | POA: Insufficient documentation

## 2020-08-16 MED ORDER — DEXAMETHASONE SODIUM PHOSPHATE 10 MG/ML IJ SOLN
10.0000 mg | Freq: Once | INTRAMUSCULAR | Status: AC
  Administered 2020-08-16: 10 mg

## 2020-08-16 MED ORDER — LIDOCAINE HCL (PF) 2 % IJ SOLN
INTRAMUSCULAR | Status: AC
  Filled 2020-08-16: qty 10

## 2020-08-16 MED ORDER — MIDAZOLAM HCL 5 MG/5ML IJ SOLN
INTRAMUSCULAR | Status: AC
  Filled 2020-08-16: qty 5

## 2020-08-16 MED ORDER — ROPIVACAINE HCL 2 MG/ML IJ SOLN
9.0000 mL | Freq: Once | INTRAMUSCULAR | Status: AC
  Administered 2020-08-16: 20 mL via PERINEURAL

## 2020-08-16 MED ORDER — MIDAZOLAM HCL 5 MG/5ML IJ SOLN
1.0000 mg | INTRAMUSCULAR | Status: DC | PRN
  Administered 2020-08-16: 2 mg via INTRAVENOUS

## 2020-08-16 MED ORDER — DEXAMETHASONE SODIUM PHOSPHATE 10 MG/ML IJ SOLN
INTRAMUSCULAR | Status: AC
  Filled 2020-08-16: qty 2

## 2020-08-16 MED ORDER — LIDOCAINE HCL 2 % IJ SOLN
20.0000 mL | Freq: Once | INTRAMUSCULAR | Status: AC
  Administered 2020-08-16: 100 mg

## 2020-08-16 MED ORDER — ROPIVACAINE HCL 2 MG/ML IJ SOLN
INTRAMUSCULAR | Status: AC
  Filled 2020-08-16: qty 20

## 2020-08-16 NOTE — Patient Instructions (Signed)
You may restart your Xarelto tomorrow.  ____________________________________________________________________________________________  Post-procedure Information What to expect: Most procedures involve the use of a local anesthetic (numbing medicine), and a steroid (anti-inflammatory medicine).  The local anesthetics may cause temporary numbness and weakness of the legs or arms, depending on the location of the block. This numbness/weakness may last 4-6 hours, depending on the local anesthetic used. In rare instances, it can last up to 24 hours. While numb, you must be very careful not to injure the extremity.  After any procedure, you could expect the pain to get better within 15-20 minutes. This relief is temporary and may last 4-6 hours. Once the local anesthetics wears off, you could experience discomfort, possibly more than usual, for up to 10 (ten) days. In the case of radiofrequencies, it may last up to 6 weeks. Surgeries may take up to 8 weeks for the healing process. The discomfort is due to the irritation caused by needles going through skin and muscle. To minimize the discomfort, we recommend using ice the first day, and heat from then on. The ice should be applied for 15 minutes on, and 15 minutes off. Keep repeating this cycle until bedtime. Avoid applying the ice directly to the skin, to prevent frostbite. Heat should be used daily, until the pain improves (4-10 days). Be careful not to burn yourself.  Occasionally you may experience muscle spasms or cramps. These occur as a consequence of the irritation caused by the needle sticks to the muscle and the blood that will inevitably be lost into the surrounding muscle tissue. Blood tends to be very irritating to tissues, which tend to react by going into spasm. These spasms may start the same day of your procedure, but they may also take days to develop. This late onset type of spasm or cramp is usually caused by electrolyte imbalances  triggered by the steroids, at the level of the kidney. Cramps and spasms tend to respond well to muscle relaxants, multivitamins (some are triggered by the procedure, but may have their origins in vitamin deficiencies), and "Gatorade", or any sports drinks that can replenish any electrolyte imbalances. (If you are a diabetic, ask your pharmacist to get you a sugar-free brand.) Warm showers or baths may also be helpful. Stretching exercises are highly recommended.  General Instructions:  Be alert for signs of possible infection: redness, swelling, heat, red streaks, elevated temperature, and/or fever. These typically appear 4 to 6 days after the procedure. Immediately notify your doctor if you experience unusual bleeding, difficulty breathing, or loss of bowel or bladder control. If you experience increased pain, do not increase your pain medicine intake, unless instructed by your pain physician.  Post-Procedure Care:  Be careful in moving about. Muscle spasms in the area of the injection may occur. Applying ice or heat to the area is often helpful. The incidence of spinal headaches after epidural injections ranges between 1.4% and 6%. If you develop a headache that does not seem to respond to conservative therapy, please let your physician know. This can be treated with an epidural blood patch.   Post-procedure numbness or redness is to be expected, however it should average 4 to 6 hours. If numbness and weakness of your extremities begins to develop 4 to 6 hours after your procedure, and is felt to be progressing and worsening, immediately contact your physician.  Diet:  If you experience nausea, do not eat until this sensation goes away. If you had a "Stellate Ganglion Block" for upper extremity "  Reflex Sympathetic Dystrophy", do not eat or drink until your hoarseness goes away. In any case, always start with liquids first and if you tolerate them well, then slowly progress to more solid  foods.  Activity:  For the first 4 to 6 hours after the procedure, use caution in moving about as you may experience numbness and/or weakness. Use caution in cooking, using household electrical appliances, and climbing steps. If you need to reach your Doctor call our office: [phone] (During business hours) or [phone] (After business hours).  Business Hours: Monday-Thursday 8:00 am - 4:00 PM    Fridays: Closed     In case of an emergency: In case of emergency, call 911 or go to the nearest emergency room and have the physician there call us.  Interpretation of Procedure Every nerve block has two components: a diagnostic component, and a treatment component. Unrealistic expectations are the most common causes of "perceived failure".  In a perfect world, a single nerve block should be able to completely and permanently eliminate the pain. Sadly, the world is not perfect.  Most pain management nerve blocks are performed using local anesthetics and steroids. Steroids are responsible for any long-term benefit that you may experience. Their purpose is to decrease any chronic swelling that may exist in the area. Steroids begin to work immediately after being injected. However, most patients will not experience any benefits until 5 to 10 days after the injection, when the swelling has come down to the point where they can tell a difference. Steroids will only help if there is swelling to be treated. As such, they can assist with the diagnosis. If effective, they suggest an inflammatory component to the pain, and if ineffective, they rule out inflammation as the main cause or component of the problem. If the problem is one of mechanical compression, you will get no benefit from those steroids.   In the case of local anesthetics, they have a crucial role in the diagnosis of your condition. Most will begin to work within15 to 20 minutes after injection. The duration will depend on the type used  (short- vs. Long-acting). It is of outmost importance that patients keep tract of their pain, after the procedure. To assist with this matter, a "Post-procedure Pain Diary" is provided. Make sure to complete it and to bring it back to your follow-up appointment.  As long as the patient keeps accurate, detailed records of their symptoms after every procedure, and returns to have those interpreted, every procedure will provide [place] with invaluable information. Even a block that does not provide the patient with any relief, will always provide [place] with information about the mechanism and the origin of the pain. The only time a nerve block can be considered a waste of time is when patients do not keep track of the results, or do not keep their post-procedure appointment.  Reporting the results back to your physician The Pain Score  Pain is a subjective complaint. It cannot be seen, touched, or measured. We depend entirely on the patient's report of the pain in order to assess your condition and treatment. To evaluate the pain, we use a pain scale, where "0" means "No Pain", and a "10" is "the worst possible pain that you can even imagine" (i.e. something like been eaten alive by a shark or being torn apart by a lion).   Use the Pain Scale provided. You will frequently be asked to rate your pain. Please be accurate, remember that  medical decisions will be based on your responses. Please do not rate your pain above a 10. Doing so is actually interpreted as "symptom magnification" (exaggeration). To put this into perspective, when you tell us that your pain is at a 10 (ten), what you are saying is that there is nothing we can do to make this pain any worse. (Carefully think about that.) ____________________________________________________________________________________________

## 2020-08-16 NOTE — Progress Notes (Signed)
Safety precautions to be maintained throughout the outpatient stay will include: orient to surroundings, keep bed in low position, maintain call bell within reach at all times, provide assistance with transfer out of bed and ambulation.  

## 2020-08-16 NOTE — Progress Notes (Signed)
PROVIDER NOTE: Information contained herein reflects review and annotations entered in association with encounter. Interpretation of such information and data should be left to medically-trained personnel. Information provided to patient can be located elsewhere in the medical record under "Patient Instructions". Document created using STT-dictation technology, any transcriptional errors that may result from process are unintentional.    Patient: [name]  Service Category: Procedure  Provider: Gillis Santa, MD  DOB: Aug 05, 1955  DOS: 08/16/2020  Location: [facility]  MRN: [id number]  Setting: Ambulatory - outpatient  Referring Provider: Nobie Putnam *  Type: Established Patient  Specialty: Interventional Pain Management  PCP: Olin Hauser, DO   Primary Reason for Visit: Interventional Pain Management Treatment. CC: Back Pain  Procedure:          Anesthesia, Analgesia, Anxiolysis:  Type: Lumbar Facet, Medial Branch Block(s) #1  Primary Purpose: Diagnostic Region: Posterolateral Lumbosacral Spine Level: L3, L4, L5, Medial Branch Level(s). Injecting these levels blocks the L3-4, L4-5, lumbar facet joints. Laterality: Bilateral  Type:  moderte Sedation Indication(s): Analgesia and Anxiety Route: Oral (PO) IV Access: Secured Sedation: Meaningful verbal contact was maintained at all times during the procedure  Local Anesthetic: Lidocaine 1-2%  Position: Prone   Indications: 1. Lumbar facet joint syndrome   2. Chronic pain syndrome    Pain Score: Pre-procedure: 7 /10 Post-procedure: 0-No pain/10   Patient's last dose of Xarelto was Wednesday.  Pre-op H&P Assessment:  Mr. Indovina is a 65 y.o. (year old), male patient, seen today for interventional treatment. He  has a past surgical history that includes Knee surgery; Gastric bypass (2002); prostate seeding; Carpal tunnel release (Right, 12/23/2014); Carpal tunnel release (Left, 01/06/2015);  Colonoscopy with propofol (N/A, 10/08/2017); and Cardioversion (N/A, 05/15/2019). Mr. Catino has a current medication list which includes the following prescription(s): acetaminophen, amiodarone, benazepril, cyanocobalamin, cyclobenzaprine, ezetimibe, fluticasone, furosemide, gabapentin, ibuprofen, metoprolol succinate, multivitamin with minerals, rosuvastatin, shingrix, trazodone, and rivaroxaban, and the following Facility-Administered Medications: midazolam. His primarily concern today is the Back Pain  Initial Vital Signs:  Pulse/HCG Rate: (!) 50ECG Heart Rate: (!) 50 Temp: (!) 97 F (36.1 C) Resp: 20 BP: 112/70 SpO2: 97 %  BMI: Estimated body mass index is 47.47 kg/m as calculated from the following:   Height as of this encounter: 6' (1.829 m).   Weight as of this encounter: 350 lb (158.8 kg).  Risk Assessment: Allergies: Reviewed. He has No Known Allergies.  Allergy Precautions: None required Coagulopathies: Reviewed. None identified.  Blood-thinner therapy: None at this time Active Infection(s): Reviewed. None identified. Mr. Quattrone is afebrile  Site Confirmation: Mr. Brittan was asked to confirm the procedure and laterality before marking the site Procedure checklist: Completed Consent: Before the procedure and under the influence of no sedative(s), amnesic(s), or anxiolytics, the patient was informed of the treatment options, risks and possible complications. To fulfill our ethical and legal obligations, as recommended by the American Medical Association's Code of Ethics, I have informed the patient of my clinical impression; the nature and purpose of the treatment or procedure; the risks, benefits, and possible complications of the intervention; the alternatives, including doing nothing; the risk(s) and benefit(s) of the alternative treatment(s) or procedure(s); and the risk(s) and benefit(s) of doing nothing. The patient was provided information about the general risks and possible  complications associated with the procedure. These may include, but are not limited to: failure to achieve desired goals, infection, bleeding, organ or nerve damage, allergic reactions, paralysis, and death. In addition, the patient  was informed of those risks and complications associated to Spine-related procedures, such as failure to decrease pain; infection (i.e.: Meningitis, epidural or intraspinal abscess); bleeding (i.e.: epidural hematoma, subarachnoid hemorrhage, or any other type of intraspinal or peri-dural bleeding); organ or nerve damage (i.e.: Any type of peripheral nerve, nerve root, or spinal cord injury) with subsequent damage to sensory, motor, and/or autonomic systems, resulting in permanent pain, numbness, and/or weakness of one or several areas of the body; allergic reactions; (i.e.: anaphylactic reaction); and/or death. Furthermore, the patient was informed of those risks and complications associated with the medications. These include, but are not limited to: allergic reactions (i.e.: anaphylactic or anaphylactoid reaction(s)); adrenal axis suppression; blood sugar elevation that in diabetics may result in ketoacidosis or comma; water retention that in patients with history of congestive heart failure may result in shortness of breath, pulmonary edema, and decompensation with resultant heart failure; weight gain; swelling or edema; medication-induced neural toxicity; particulate matter embolism and blood vessel occlusion with resultant organ, and/or nervous system infarction; and/or aseptic necrosis of one or more joints. Finally, the patient was informed that Medicine is not an exact science; therefore, there is also the possibility of unforeseen or unpredictable risks and/or possible complications that may result in a catastrophic outcome. The patient indicated having understood very clearly. We have given the patient no guarantees and we have made no promises. Enough time was given to the  patient to ask questions, all of which were answered to the patient's satisfaction. Mr. Kurlander has indicated that he wanted to continue with the procedure. Attestation: I, the ordering provider, attest that I have discussed with the patient the benefits, risks, side-effects, alternatives, likelihood of achieving goals, and potential problems during recovery for the procedure that I have provided informed consent. Date  Time: 08/16/2020  8:38 AM  Pre-Procedure Preparation:  Monitoring: As per clinic protocol. Respiration, ETCO2, SpO2, BP, heart rate and rhythm monitor placed and checked for adequate function Safety Precautions: Patient was assessed for positional comfort and pressure points before starting the procedure. Time-out: I initiated and conducted the "Time-out" before starting the procedure, as per protocol. The patient was asked to participate by confirming the accuracy of the "Time Out" information. Verification of the correct person, site, and procedure were performed and confirmed by me, the nursing staff, and the patient. "Time-out" conducted as per Joint Commission's Universal Protocol (UP.01.01.01). Time: 0918  Description of Procedure:          Laterality: Bilateral. The procedure was performed in identical fashion on both sides. Levels:  L3, L4, L5,  Medial Branch Level(s) Area Prepped: Posterior Lumbosacral Region DuraPrep (Iodine Povacrylex [0.7% available iodine] and Isopropyl Alcohol, 74% w/w) Safety Precautions: Aspiration looking for blood return was conducted prior to all injections. At no point did we inject any substances, as a needle was being advanced. Before injecting, the patient was told to immediately notify me if he was experiencing any new onset of "ringing in the ears, or metallic taste in the mouth". No attempts were made at seeking any paresthesias. Safe injection practices and needle disposal techniques used. Medications properly checked for expiration dates. SDV  (single dose vial) medications used. After the completion of the procedure, all disposable equipment used was discarded in the proper designated medical waste containers. Local Anesthesia: Protocol guidelines were followed. The patient was positioned over the fluoroscopy table. The area was prepped in the usual manner. The time-out was completed. The target area was identified using fluoroscopy. A 12-in long,  straight, sterile hemostat was used with fluoroscopic guidance to locate the targets for each level blocked. Once located, the skin was marked with an approved surgical skin marker. Once all sites were marked, the skin (epidermis, dermis, and hypodermis), as well as deeper tissues (fat, connective tissue and muscle) were infiltrated with a small amount of a short-acting local anesthetic, loaded on a 10cc syringe with a 25G, 1.5-in  Needle. An appropriate amount of time was allowed for local anesthetics to take effect before proceeding to the next step. Local Anesthetic: Lidocaine 2.0% The unused portion of the local anesthetic was discarded in the proper designated containers. Technical explanation of process:   L3 Medial Branch Nerve Block (MBB): The target area for the L3 medial branch is at the junction of the postero-lateral aspect of the superior articular process and the superior, posterior, and medial edge of the transverse process of L4. Under fluoroscopic guidance, a Quincke needle was inserted until contact was made with os over the superior postero-lateral aspect of the pedicular shadow (target area). After negative aspiration for blood, 51m of the nerve block solution was injected without difficulty or complication. The needle was removed intact. L4 Medial Branch Nerve Block (MBB): The target area for the L4 medial branch is at the junction of the postero-lateral aspect of the superior articular process and the superior, posterior, and medial edge of the transverse process of L5. Under  fluoroscopic guidance, a Quincke needle was inserted until contact was made with os over the superior postero-lateral aspect of the pedicular shadow (target area). After negative aspiration for blood, 243mof the nerve block solution was injected without difficulty or complication. The needle was removed intact. L5 Medial Branch Nerve Block (MBB): The target area for the L5 medial branch is at the junction of the postero-lateral aspect of the superior articular process and the superior, posterior, and medial edge of the sacral ala. Under fluoroscopic guidance, a Quincke needle was inserted until contact was made with os over the superior postero-lateral aspect of the pedicular shadow (target area). After negative aspiration for blood, 2m33mf the nerve block solution was injected without difficulty or complication. The needle was removed intact.  Nerve block solution: 12 cc solution made of 10 cc of 0.2% ropivacaine, 2 cc of Decadron 10 mg/cc.  2 cc injected each level above bilaterally.    Procedural Needles: 22-gauge, 5-inch, Quincke needles used for all levels.  Once the entire procedure was completed, the treated area was cleaned, making sure to leave some of the prepping solution back to take advantage of its long term bactericidal properties.      Illustration of the posterior view of the lumbar spine and the posterior neural structures. Laminae of L2 through S1 are labeled. DPRL5, dorsal primary ramus of L5; DPRS1, dorsal primary ramus of S1; DPR3, dorsal primary ramus of L3; FJ, facet (zygapophyseal) joint L3-L4; I, inferior articular process of L4; LB1, lateral branch of dorsal primary ramus of L1; IAB, inferior articular branches from L3 medial branch (supplies L4-L5 facet joint); IBP, intermediate branch plexus; MB3, medial branch of dorsal primary ramus of L3; NR3, third lumbar nerve root; S, superior articular process of L5; SAB, superior articular branches from L4 (supplies L4-5 facet joint  also); TP3, transverse process of L3.  Vitals:   08/16/20 0924 08/16/20 0927 08/16/20 0937 08/16/20 0947  BP: 118/81 119/75 (!) 90/51 103/60  Pulse:      Resp: '15 15 16 16  '$ Temp:  TempSrc:      SpO2: 92% 92% 96% 97%  Weight:      Height:         Start Time: 0918 hrs. End Time: 0926 hrs.  Imaging Guidance (Spinal):          Type of Imaging Technique: Fluoroscopy Guidance (Spinal) Indication(s): Assistance in needle guidance and placement for procedures requiring needle placement in or near specific anatomical locations not easily accessible without such assistance. Exposure Time: Please see nurses notes. Contrast: None used. Fluoroscopic Guidance: I was personally present during the use of fluoroscopy. "Tunnel Vision Technique" used to obtain the best possible view of the target area. Parallax error corrected before commencing the procedure. "Direction-depth-direction" technique used to introduce the needle under continuous pulsed fluoroscopy. Once target was reached, antero-posterior, oblique, and lateral fluoroscopic projection used confirm needle placement in all planes. Images permanently stored in EMR. Interpretation: No contrast injected. I personally interpreted the imaging intraoperatively. Adequate needle placement confirmed in multiple planes. Permanent images saved into the patient's record.   Post-operative Assessment:  Post-procedure Vital Signs:  Pulse/HCG Rate: (!) 50(!) 47 Temp: (!) 97 F (36.1 C) Resp: 16 BP: 103/60 (po fluids given) SpO2: 97 %  EBL: None  Complications: No immediate post-treatment complications observed by team, or reported by patient.  Note: The patient tolerated the entire procedure well. A repeat set of vitals were taken after the procedure and the patient was kept under observation following institutional policy, for this type of procedure. Post-procedural neurological assessment was performed, showing return to baseline, prior to  discharge. The patient was provided with post-procedure discharge instructions, including a section on how to identify potential problems. Should any problems arise concerning this procedure, the patient was given instructions to immediately contact us, at any time, without hesitation. In any case, we plan to contact the patient by telephone for a follow-up status report regarding this interventional procedure.  Comments:  No additional relevant information.  Plan of Care  Patient instructed to restart Xarelto tomorrow so long as he is not having any lower extremity weakness.  Orders:  Orders Placed This Encounter  Procedures   DG [facility] C-ARM 1-60 MIN NO REPORT    Intraoperative interpretation by procedural physician at [facility].    Standing Status:   Standing    Number of Occurrences:   1    Order Specific Question:   Reason for exam:    Answer:   Assistance in needle guidance and placement for procedures requiring needle placement in or near specific anatomical locations not easily accessible without such assistance.   Chronic Opioid Analgesic:  Tramadol 50 mg 4 times daily as needed, quantity 120/month; MME equals 20    Medications ordered for procedure: Meds ordered this encounter  Medications   lidocaine (XYLOCAINE) 2 % (with pres) injection 400 mg   midazolam (VERSED) 5 MG/5ML injection 1-2 mg    Make sure Flumazenil is available in the pyxis when using this medication. If oversedation occurs, administer 0.2 mg IV over 15 sec. If after 45 sec no response, administer 0.2 mg again over 1 min; may repeat at 1 min intervals; not to exceed 4 doses (1 mg)   ropivacaine (PF) 2 mg/mL (0.2%) (NAROPIN) injection 9 mL   dexamethasone (DECADRON) injection 10 mg   ropivacaine (PF) 2 mg/mL (0.2%) (NAROPIN) injection 9 mL   dexamethasone (DECADRON) injection 10 mg   Medications administered: We administered lidocaine, midazolam, ropivacaine (PF) 2 mg/mL (0.2%),  dexamethasone, ropivacaine (  PF) 2 mg/mL (0.2%), and dexamethasone.  See the medical record for exact dosing, route, and time of administration.  Follow-up plan:   Return in about 6 weeks (around 09/27/2020) for Post Procedure Evaluation, virtual.      s/p lesi #1 (left L4/5) on 8/17, #2 on 10/21/2018 (left L3/4), #3 on 12/09/2018 (Left L3/4); 02/24/2019: Right wrist injection and left trigger finger (middle finger) injection.,  Bilateral wrist injection 10/15/2019, bilateral knee Hyalgan No. 1 01/26/1960. #2 02/23/2020, #3 03/24/2020.  Endorsing approximately 75 to 80% pain relief for bilateral knee pain.       Recent Visits Date Type Provider Dept  07/22/20 Office Visit Gillis Santa, MD [facility]  06/28/20 Procedure visit Gillis Santa, MD [facility]  06/07/20 Procedure visit Gillis Santa, MD [facility]  05/19/20 Procedure visit Gillis Santa, MD [facility]  Showing recent visits within past 90 days and meeting all other requirements Today's Visits Date Type Provider Dept  08/16/20 Procedure visit Gillis Santa, MD [facility]  Showing today's visits and meeting all other requirements Future Appointments Date Type Provider Dept  09/27/20 Appointment Gillis Santa, MD [facility]  Showing future appointments within next 90 days and meeting all other requirements Disposition: Discharge home  Discharge (Date  Time): 08/16/2020; 0958 hrs.   Primary Care Physician: Olin Hauser, DO Location: [facility] Note by: Gillis Santa, MD Date: 08/16/2020; Time: 10:38 AM  Disclaimer:  Medicine is not an exact science. The only guarantee in medicine is that nothing is guaranteed. It is important to note that the decision to proceed with this intervention was based on the information collected from the patient. The Data and conclusions were drawn from the patient's questionnaire, the interview, and the  physical examination. Because the information was provided in large part by the patient, it cannot be guaranteed that it has not been purposely or unconsciously manipulated. Every effort has been made to obtain as much relevant data as possible for this evaluation. It is important to note that the conclusions that lead to this procedure are derived in large part from the available data. Always take into account that the treatment will also be dependent on availability of resources and existing treatment guidelines, considered by other Pain Management Practitioners as being common knowledge and practice, at the time of the intervention. For Medico-Legal purposes, it is also important to point out that variation in procedural techniques and pharmacological choices are the acceptable norm. The indications, contraindications, technique, and results of the above procedure should only be interpreted and judged by a Board-Certified Interventional Pain Specialist with extensive familiarity and expertise in the same exact procedure and technique.

## 2020-08-17 ENCOUNTER — Telehealth: Payer: Self-pay | Admitting: Cardiovascular Disease

## 2020-08-17 ENCOUNTER — Telehealth: Payer: Self-pay | Admitting: *Deleted

## 2020-08-17 NOTE — Telephone Encounter (Signed)
Pt c/o BP issue: STAT if pt c/o blurred vision, one-sided weakness or slurred speech  1. What are your last 5 BP readings?   100's-150's / 60's-90's HR 40's-50's    2. Are you having any other symptoms (ex. Dizziness, headache, blurred vision, passed out)? Headache dizziness when standing   3. What is your BP issue? Recent dc amlodipine but bp and hr still low patient continues to have symptoms

## 2020-08-17 NOTE — Telephone Encounter (Signed)
Denies any post procedure issues. 

## 2020-08-17 NOTE — Telephone Encounter (Signed)
Was able to return call to Mr. Heitman regarding his ongoing s/s of dizziness and low HR, pt reports has not improved since stopping amlodipine. Currently still taking Toprol XL 50 mg QD, Gollan had mention may need to cut back if s/s persist at last OV.   Current BP readings per pt   BP ranges from 100's-150's SBP / 60's-90's DBP HR 40's-50's Stated at ortho appt, HR 47 Pt reports with "activity my HR will go up to the 70s  Pt reports other then the concern with low HR and dizziness he feels fine, still able to complete ADL and work. Advised to hydrate when working and long periods of being outside, and if dizzy to rest and move slow.   Per OV notes on 7/13 Having orthostatic symptoms since weight has been trending down Orthostatic positive today, drop in pressure 20-30 points today with heart rates in the 40s EKG personally reviewed, Sinus bradycardia rate 48 bpm Discuss bradycardia, may need to cut back on the metoprolol for further symptoms of orthostasis, Stop amlodipine given orthostasis  Will reach out to Dr. Rockey Situ for medicaiotn adjustments and recommendations, will call Mr. Ferroni back to f/u on POC. Pt verbalized understanding and thankful for the return call.   Message r/t Rockey Situ

## 2020-08-20 NOTE — Telephone Encounter (Signed)
Patient calling to check status   Aware per nurse md response pending .  Will await call back.

## 2020-08-24 MED ORDER — METOPROLOL SUCCINATE ER 50 MG PO TB24: 25.0000 mg | ORAL_TABLET | Freq: Every day | ORAL | 3 refills | Status: DC

## 2020-08-24 NOTE — Addendum Note (Signed)
Addended by: Wynema Birch on: 08/24/2020 08:56 AM   Modules accepted: Orders

## 2020-08-24 NOTE — Telephone Encounter (Signed)
Reached back out to Mr. Rockwell via phone, following-up on phone conversation last week regarding pt's HR in the 55s. Dr. Rockey Situ advised Would cut the metoprolol in 1/2 daily  Continue to monitor HR and BP  Thx  TG  Mr. Bateson verbalized understanding, currently on 50 mg Toprol XL, will reduce to 25 mg daily, advised to cut pill in half for correct dosing. Mr. Schley thankful for the return call, will continue to monitor BP & HR.

## 2020-09-14 ENCOUNTER — Other Ambulatory Visit: Payer: Self-pay | Admitting: Family Medicine

## 2020-09-14 DIAGNOSIS — D510 Vitamin B12 deficiency anemia due to intrinsic factor deficiency: Secondary | ICD-10-CM

## 2020-09-14 NOTE — Telephone Encounter (Signed)
Requested medications are due for refill today.  yes  Requested medications are on the active medications list.  yes  Last refill. 09/08/2019  Future visit scheduled.   yes  Notes to clinic.  Prescription is expired. No protocol assigned.

## 2020-09-27 ENCOUNTER — Ambulatory Visit
Payer: Medicare Other | Attending: Student in an Organized Health Care Education/Training Program | Admitting: Student in an Organized Health Care Education/Training Program

## 2020-09-27 ENCOUNTER — Other Ambulatory Visit: Payer: Self-pay

## 2020-09-27 DIAGNOSIS — M47816 Spondylosis without myelopathy or radiculopathy, lumbar region: Secondary | ICD-10-CM | POA: Diagnosis not present

## 2020-09-27 DIAGNOSIS — G894 Chronic pain syndrome: Secondary | ICD-10-CM

## 2020-09-27 DIAGNOSIS — M170 Bilateral primary osteoarthritis of knee: Secondary | ICD-10-CM

## 2020-09-27 NOTE — Progress Notes (Signed)
Patient: [name]  Service Category: E/M  Provider: Gillis Santa, MD  DOB: [birth year]  DOS: 09/27/2020  Location: Office  MRN: [id number]  Setting: Ambulatory outpatient  Referring Provider: Nobie Putnam *  Type: Established Patient  Specialty: Interventional Pain Management  PCP: Olin Hauser, DO  Location: Home  Delivery: TeleHealth     Virtual Encounter - Pain Management PROVIDER NOTE: Information contained herein reflects review and annotations entered in association with encounter. Interpretation of such information and data should be left to medically-trained personnel. Information provided to patient can be located elsewhere in the medical record under "Patient Instructions". Document created using STT-dictation technology, any transcriptional errors that may result from process are unintentional.    Contact & Pharmacy Preferred: [phone] Home: [phone] (home) Mobile: [phone] (mobile) E-mail: [email or web address]  [id number] [place], [place] [address]Phone: [phone]: [phone] [id number] [address] [address] [facility] [address]Phone: [phone]: [phone]  Pre-screening  [name] offered "in-person" vs "virtual" encounter. He indicated preferring virtual for this encounter.   Reason COVID-19*  Social distancing based on CDC and AMA recommendations.   I contacted Wayne Hansenon 09/27/2020 via telephone.      I clearly identified myself as BGillis Santa MD. I verified that I was speaking with the correct person using two identifiers (Name: [name] and date of birth: 701/24/1957.  Consent I sought verbal advanced consent from Wayne Hansenfor virtual visit interactions. I informed [name] possible security and privacy concerns, risks, and limitations associated with providing "not-in-person"  medical evaluation and management services. I also informed Mr. FRoperof the availability of "in-person" appointments. Finally, I informed him that there would be a charge for the virtual visit and that he could be  personally, fully or partially, financially responsible for it. [name] understanding and agreed to proceed.   Historic Elements   Mr. RZAYDE STROUPEis a 65y.o. year old, male patient evaluated today after our last contact on 08/16/2020. [name] has a past medical history of Cardiomyopathy (in setting of Afib), DJD (degenerative joint disease) of knee, History of kidney stones, Intervertebral disc disorder with radiculopathy of lumbosacral region, Kidney stone (2012), Lower extremity edema, PAF (paroxysmal atrial fibrillation) ([place], Pernicious anemia, Prostate cancer ([place], Pulmonary nodule, right, and SVT (supraventricular tachycardia) ([place]. He also  has a past surgical history that includes Knee surgery; Gastric bypass (2002); prostate seeding; Carpal tunnel release (Right, 12/23/2014); Carpal tunnel release (Left, 01/06/2015); Colonoscopy with propofol (N/A, 10/08/2017); and Cardioversion (N/A, 05/15/2019). Mr. FFyockhas a current medication list which includes the following prescription(s): acetaminophen, amiodarone, benazepril, cyanocobalamin, cyclobenzaprine, ezetimibe, fluticasone, furosemide, gabapentin, ibuprofen, metoprolol succinate, multivitamin with minerals, rivaroxaban, rosuvastatin, shingrix, and trazodone. He  reports that he quit smoking about 41 years ago. His smoking use included cigarettes. He has a 10.00 pack-year smoking history. He has quit using smokeless tobacco.  His smokeless tobacco use included snuff. He reports current alcohol use of about 12.0 standard drinks per week. He reports that he does not use drugs. Mr. FAmbrosinohas No Known Allergies.   HPI  Today, he is being contacted for a post-procedure assessment.   Post-Procedure Evaluation  Procedure  (08/16/2020):   Type: Lumbar Facet, Medial Branch Block(s) #1  Primary Purpose: Diagnostic Region: Posterolateral Lumbosacral Spine Level:  L3, L4, L5, Medial Branch Level(s). Injecting these levels blocks the L3-4, L4-5, lumbar facet joints. Laterality: Bilateral  Anxiolysis: Please see nurses note.  Effectiveness during initial hour after procedure (Ultra-Short Term Relief): 100 %   Local anesthetic used: Long-acting (4-6 hours) Effectiveness: Defined as any analgesic benefit obtained secondary to the administration of local anesthetics. This carries significant diagnostic value as to the etiological location, or anatomical origin, of the pain. Duration of benefit is expected to coincide with the duration of the local anesthetic used.  Effectiveness during initial 4-6 hours after procedure (Short-Term Relief): 100 %   Long-term benefit: Defined as any relief past the pharmacologic duration of the local anesthetics.  Effectiveness past the initial 6 hours after procedure (Long-Term Relief): 100 % (lasting 2 weeks)   Benefits, current: Defined as benefit present at the time of this evaluation.   Analgesia:  100% for first 3 weeks than gradual return of pain, now pain pretty much back at baseline Function: [name] reports improvement in function ROM: [name] reports improvement in ROM   Pharmacotherapy Assessment   Analgesic: Tramadol 50 mg 4 times daily as needed, quantity 120/month; MME equals 20    Monitoring: [place] PMP: PDMP reviewed during this encounter.       Pharmacotherapy: No side-effects or adverse reactions reported. Compliance: No problems identified. Effectiveness: Clinically acceptable. Plan: Refer to "POC". UDS:  Summary  Date Value Ref Range Status  04/06/2020 Note  Final    Comment:    ==================================================================== [employer] Select 13 (MW) ==================================================================== Test                              Result       Flag       Units  Drug Present and Declared for Prescription Verification   Tramadol                       >4386        EXPECTED   ng/mg creat   O-Desmethyltramadol            3353         EXPECTED   ng/mg creat   N-Desmethyltramadol            >4386        EXPECTED   ng/mg creat    Source of tramadol is a prescription medication. O-desmethyltramadol    and N-desmethyltramadol are expected metabolites of tramadol.  ==================================================================== Test                      Result    Flag   Units      Ref Range   Creatinine              114              mg/dL      >=20 ==================================================================== Declared Medications:  The flagging and interpretation on this report are based on the  following declared medications.  Unexpected results may arise from  inaccuracies in the declared medications.   **Note: The testing scope of this panel includes these medications:   Tramadol   **Note: The testing scope of this panel does not include the  following reported medications:   Acetaminophen (Tylenol)  Amiodarone  Amlodipine  Benazepril  Cyclobenzaprine (Flexeril)  Ezetimibe  Fluticasone  Furosemide  Gabapentin  Menthol  Methyl Salicylate (topical)  Metoprolol  Mirtazapine  Multivitamin  Rivaroxaban  Rosuvastatin  Trazodone  Vitamin B12 ==================================================================== For clinical consultation, please call [phone]. ====================================================================      Laboratory Chemistry Profile   Renal Lab Results  Component Value Date   BUN 17 07/21/2020   CREATININE 1.21 63/14/9702   BCR NOT APPLICABLE 63/78/5885   GFRAA 72 07/21/2020   GFRNONAA 62 07/21/2020    Hepatic Lab Results  Component Value Date   AST 15 07/21/2020   ALT 11 07/21/2020   ALBUMIN 4.6 08/01/2019   ALKPHOS 72 08/01/2019    LIPASE 28 03/13/2017    Electrolytes Lab Results  Component Value Date   NA 143 07/21/2020   K 4.2 07/21/2020   CL 108 07/21/2020   CALCIUM 9.0 07/21/2020   MG 2.0 05/05/2019    Bone No results found for: VD25OH, VD125OH2TOT, OY7741OI7, OM7672CN4, 25OHVITD1, 25OHVITD2, 25OHVITD3, TESTOFREE, TESTOSTERONE  Inflammation (CRP: Acute Phase) (ESR: Chronic Phase) No results found for: CRP, ESRSEDRATE, LATICACIDVEN       Note: Above Lab results reviewed.    Assessment  The primary encounter diagnosis was Lumbar facet joint syndrome. Diagnoses of Lumbar facet arthropathy, Bilateral primary osteoarthritis of knee, and Chronic pain syndrome were also pertinent to this visit.  Plan of Care    Mr. IRL BODIE has a current medication list which includes the following long-term medication(s): amiodarone, benazepril, ezetimibe, fluticasone, furosemide, gabapentin, metoprolol succinate, rivaroxaban, rosuvastatin, and trazodone.   Mr. Adriano responded positively to his first set of bilateral L3, L4, L5 medial branch nerve blocks for lumbar facet arthropathy.  He states that for the first 3 weeks, he experienced almost 100% pain relief, improvement in his posture and ability to ambulate.  He states that he was really surprised at how much pain relief he received with his first injection.  He states that the pain relief started to wear off and now his pain is back to its baseline, prior to his lumbar facet medial branch nerve blocks.  We discussed neck steps which include a second diagnostic medial branch nerve block followed by radiofrequency ablation of the lumbar medial branch nerves for the purpose of obtaining longer-term pain relief.  Risks and benefits were reviewed and patient would like to proceed with his second set of bilateral L3, L4, L5 medial branch nerve blocks with p.o. Valium.  He is instructed to stop Xarelto 3 days prior.  Future considerations will likely include lumbar  radiofrequency ablation for long-term pain relief.  Orders:  Orders Placed This Encounter  Procedures   LUMBAR FACET(MEDIAL BRANCH NERVE BLOCK) MBNB    Standing Status:   Future    Standing Expiration Date:   10/27/2020    Scheduling Instructions:     Procedure: Lumbar facet block (AKA.: Lumbosacral medial branch nerve block)     Side: Bilateral     Level: L3-4, L4-5, acets (L3, L4, L5, Medial Branch Nerves)     Sedation: PO Valium     Timeframe: Stop Xarelto 3 days prior    Order Specific Question:   Where will this procedure be performed?    Answer:   [facility]    Follow-up plan:   Return in about 1 week (around 10/04/2020) for B/L L3, 4, 5 Fct block #2, stop Xarelto 3 days prior, minimal sedation (PO Valium).     s/p lesi #1 (left L4/5) on 8/17, #2 on 10/21/2018 (left L3/4), #3 on 12/09/2018 (Left L3/4); 02/24/2019: Right wrist injection and left trigger finger (middle finger) injection.,  Bilateral wrist injection 10/15/2019, bilateral knee Hyalgan No. 1 01/26/1960. #2 02/23/2020, #3 03/24/2020.  Endorsing approximately 75 to 80% pain relief for bilateral knee pain.        Recent Visits Date Type Provider Dept  08/16/20 Procedure visit Gillis Santa, MD [facility]  07/22/20 Office Visit Gillis Santa, MD [facility]  Showing recent visits within past 90 days and meeting all other requirements Today's Visits Date Type Provider Dept  09/27/20 Telemedicine Gillis Santa, MD [facility]  Showing today's visits and meeting all other requirements Future Appointments No visits were found meeting these conditions. Showing future appointments within next 90 days and meeting all other requirements I discussed the assessment and treatment plan with the patient. The patient was provided an opportunity to ask questions and all were answered. The patient agreed with the plan and demonstrated an understanding of the instructions.  Patient advised to call back  or seek an in-person evaluation if the symptoms or condition worsens.  Duration of encounter: 38mnutes.  Note by: BGillis Santa MD Date: 09/27/2020; Time: 11:21 AM

## 2020-09-28 ENCOUNTER — Telehealth: Payer: Self-pay

## 2020-09-28 NOTE — Telephone Encounter (Signed)
Pre procedure instructions given for po valium.  Instructed patient to stop Xarelto x 3 days and to bring a driver.  Npo for 8 hours.  Patient states procedure has already been scheduled.

## 2020-10-04 ENCOUNTER — Ambulatory Visit
Admission: RE | Admit: 2020-10-04 | Discharge: 2020-10-04 | Disposition: A | Discharge status: Home / Self Care | Payer: Medicare Other | Source: Ambulatory Visit | Attending: Student in an Organized Health Care Education/Training Program | Admitting: Student in an Organized Health Care Education/Training Program

## 2020-10-04 ENCOUNTER — Other Ambulatory Visit: Payer: Self-pay

## 2020-10-04 ENCOUNTER — Ambulatory Visit (HOSPITAL_BASED_OUTPATIENT_CLINIC_OR_DEPARTMENT_OTHER): Payer: Medicare Other | Admitting: Student in an Organized Health Care Education/Training Program

## 2020-10-04 ENCOUNTER — Encounter: Payer: Self-pay | Admitting: Student in an Organized Health Care Education/Training Program

## 2020-10-04 VITALS — BP 131/90 | HR 45 | Temp 97.0°F | Resp 16 | Ht 72.0 in | Wt 350.0 lb

## 2020-10-04 DIAGNOSIS — M47816 Spondylosis without myelopathy or radiculopathy, lumbar region: Secondary | ICD-10-CM | POA: Diagnosis present

## 2020-10-04 DIAGNOSIS — G894 Chronic pain syndrome: Secondary | ICD-10-CM | POA: Diagnosis present

## 2020-10-04 MED ORDER — DIAZEPAM 5 MG PO TABS
10.0000 mg | ORAL_TABLET | ORAL | Status: AC
  Administered 2020-10-04: 10 mg via ORAL

## 2020-10-04 MED ORDER — ROPIVACAINE HCL 2 MG/ML IJ SOLN
INTRAMUSCULAR | Status: AC
  Filled 2020-10-04: qty 20

## 2020-10-04 MED ORDER — ROPIVACAINE HCL 2 MG/ML IJ SOLN
9.0000 mL | Freq: Once | INTRAMUSCULAR | Status: AC
  Administered 2020-10-04: 9 mL via PERINEURAL

## 2020-10-04 MED ORDER — LIDOCAINE HCL 2 % IJ SOLN
20.0000 mL | Freq: Once | INTRAMUSCULAR | Status: AC
  Administered 2020-10-04: 200 mg
  Filled 2020-10-04: qty 20

## 2020-10-04 MED ORDER — DEXAMETHASONE SODIUM PHOSPHATE 10 MG/ML IJ SOLN
10.0000 mg | Freq: Once | INTRAMUSCULAR | Status: AC
  Administered 2020-10-04: 10 mg
  Filled 2020-10-04: qty 1

## 2020-10-04 MED ORDER — LIDOCAINE HCL (PF) 2 % IJ SOLN
INTRAMUSCULAR | Status: AC
  Filled 2020-10-04: qty 10

## 2020-10-04 MED ORDER — DIAZEPAM 5 MG PO TABS
ORAL_TABLET | ORAL | Status: AC
  Filled 2020-10-04: qty 2

## 2020-10-04 NOTE — Patient Instructions (Signed)

## 2020-10-04 NOTE — Progress Notes (Signed)
Safety precautions to be maintained throughout the outpatient stay will include: orient to surroundings, keep bed in low position, maintain call bell within reach at all times, provide assistance with transfer out of bed and ambulation.  

## 2020-10-04 NOTE — Progress Notes (Signed)
PROVIDER NOTE: Information contained herein reflects review and annotations entered in association with encounter. Interpretation of such information and data should be left to medically-trained personnel. Information provided to patient can be located elsewhere in the medical record under "Patient Instructions". Document created using STT-dictation technology, any transcriptional errors that may result from process are unintentional.    Patient: [name]  Service Category: Procedure  Provider: Gillis Santa, MD  DOB: [birth year]  DOS: 10/04/2020  Location: [facility]  MRN: [id number]  Setting: Ambulatory - outpatient  Referring Provider: Nobie Putnam *  Type: Established Patient  Specialty: Interventional Pain Management  PCP: Olin Hauser, DO   Primary Reason for Visit: Interventional Pain Management Treatment. CC: Back Pain (low)  Procedure:          Anesthesia, Analgesia, Anxiolysis:  Type: Lumbar Facet, Medial Branch Block(s) #2  Primary Purpose: Diagnostic Region: Posterolateral Lumbosacral Spine Level: L3, L4, L5, Medial Branch Level(s). Injecting these levels blocks the L3-4, L4-5, lumbar facet joints. Laterality: Bilateral  Type:  moderte Sedation Indication(s): Analgesia and Anxiety Route: Oral (PO) IV Access: Declined Sedation: Meaningful verbal contact was maintained at all times during the procedure  Local Anesthetic: Lidocaine 1-2%  Position: Prone   Indications: 1. Lumbar facet joint syndrome   2. Lumbar facet arthropathy   3. Chronic pain syndrome    Pain Score: Pre-procedure: 7 /10 Post-procedure: 1 /10   Patient's last dose of Xarelto was Wednesday.  Pre-op H&P Assessment:  [name] is a 65 y.o. (year old), male patient, seen today for interventional treatment. He  has a past surgical history that includes Knee surgery; Gastric bypass (2002); prostate seeding; Carpal tunnel release (Right, 12/23/2014); Carpal tunnel  release (Left, 01/06/2015); Colonoscopy with propofol (N/A, 10/08/2017); and Cardioversion (N/A, 05/15/2019). [name] has a current medication list which includes the following prescription(s): acetaminophen, amiodarone, benazepril, cyanocobalamin, cyclobenzaprine, ezetimibe, fluticasone, furosemide, gabapentin, ibuprofen, metoprolol succinate, multivitamin with minerals, rivaroxaban, rosuvastatin, shingrix, and trazodone. His primarily concern today is the Back Pain (low)  Initial Vital Signs:  Pulse/HCG Rate: (!) 45ECG Heart Rate: (!) 46 Temp: (!) 97 F (36.1 C) Resp: 16 BP: 139/79 SpO2: 96 %  BMI: Estimated body mass index is 47.47 kg/m as calculated from the following:   Height as of this encounter: 6' (1.829 m).   Weight as of this encounter: 350 lb (158.8 kg).  Risk Assessment: Allergies: Reviewed. He has No Known Allergies.  Allergy Precautions: None required Coagulopathies: Reviewed. None identified.  Blood-thinner therapy: None at this time Active Infection(s): Reviewed. None identified. [name] is afebrile  Site Confirmation: [name] was asked to confirm the procedure and laterality before marking the site Procedure checklist: Completed Consent: Before the procedure and under the influence of no sedative(s), amnesic(s), or anxiolytics, the patient was informed of the treatment options, risks and possible complications. To fulfill our ethical and legal obligations, as recommended by the American Medical Association's Code of Ethics, I have informed the patient of my clinical impression; the nature and purpose of the treatment or procedure; the risks, benefits, and possible complications of the intervention; the alternatives, including doing nothing; the risk(s) and benefit(s) of the alternative treatment(s) or procedure(s); and the risk(s) and benefit(s) of doing nothing. The patient was provided information about the general risks and possible complications associated with  the procedure. These may include, but are not limited to: failure to achieve desired goals, infection, bleeding, organ or nerve damage, allergic reactions, paralysis, and death. In addition,  the patient was informed of those risks and complications associated to Spine-related procedures, such as failure to decrease pain; infection (i.e.: Meningitis, epidural or intraspinal abscess); bleeding (i.e.: epidural hematoma, subarachnoid hemorrhage, or any other type of intraspinal or peri-dural bleeding); organ or nerve damage (i.e.: Any type of peripheral nerve, nerve root, or spinal cord injury) with subsequent damage to sensory, motor, and/or autonomic systems, resulting in permanent pain, numbness, and/or weakness of one or several areas of the body; allergic reactions; (i.e.: anaphylactic reaction); and/or death. Furthermore, the patient was informed of those risks and complications associated with the medications. These include, but are not limited to: allergic reactions (i.e.: anaphylactic or anaphylactoid reaction(s)); adrenal axis suppression; blood sugar elevation that in diabetics may result in ketoacidosis or comma; water retention that in patients with history of congestive heart failure may result in shortness of breath, pulmonary edema, and decompensation with resultant heart failure; weight gain; swelling or edema; medication-induced neural toxicity; particulate matter embolism and blood vessel occlusion with resultant organ, and/or nervous system infarction; and/or aseptic necrosis of one or more joints. Finally, the patient was informed that Medicine is not an exact science; therefore, there is also the possibility of unforeseen or unpredictable risks and/or possible complications that may result in a catastrophic outcome. The patient indicated having understood very clearly. We have given the patient no guarantees and we have made no promises. Enough time was given to the patient to ask questions, all  of which were answered to the patient's satisfaction. [name] has indicated that he wanted to continue with the procedure. Attestation: I, the ordering provider, attest that I have discussed with the patient the benefits, risks, side-effects, alternatives, likelihood of achieving goals, and potential problems during recovery for the procedure that I have provided informed consent. Date  Time: 10/04/2020 10:28 AM  Pre-Procedure Preparation:  Monitoring: As per clinic protocol. Respiration, ETCO2, SpO2, BP, heart rate and rhythm monitor placed and checked for adequate function Safety Precautions: Patient was assessed for positional comfort and pressure points before starting the procedure. Time-out: I initiated and conducted the "Time-out" before starting the procedure, as per protocol. The patient was asked to participate by confirming the accuracy of the "Time Out" information. Verification of the correct person, site, and procedure were performed and confirmed by me, the nursing staff, and the patient. "Time-out" conducted as per Joint Commission's Universal Protocol (UP.01.01.01). Time: 1200  Description of Procedure:          Laterality: Bilateral. The procedure was performed in identical fashion on both sides. Levels:  L3, L4, L5,  Medial Branch Level(s) Area Prepped: Posterior Lumbosacral Region DuraPrep (Iodine Povacrylex [0.7% available iodine] and Isopropyl Alcohol, 74% w/w) Safety Precautions: Aspiration looking for blood return was conducted prior to all injections. At no point did we inject any substances, as a needle was being advanced. Before injecting, the patient was told to immediately notify me if he was experiencing any new onset of "ringing in the ears, or metallic taste in the mouth". No attempts were made at seeking any paresthesias. Safe injection practices and needle disposal techniques used. Medications properly checked for expiration dates. SDV (single dose vial)  medications used. After the completion of the procedure, all disposable equipment used was discarded in the proper designated medical waste containers. Local Anesthesia: Protocol guidelines were followed. The patient was positioned over the fluoroscopy table. The area was prepped in the usual manner. The time-out was completed. The target area was identified using fluoroscopy. A 12-in  long, straight, sterile hemostat was used with fluoroscopic guidance to locate the targets for each level blocked. Once located, the skin was marked with an approved surgical skin marker. Once all sites were marked, the skin (epidermis, dermis, and hypodermis), as well as deeper tissues (fat, connective tissue and muscle) were infiltrated with a small amount of a short-acting local anesthetic, loaded on a 10cc syringe with a 25G, 1.5-in  Needle. An appropriate amount of time was allowed for local anesthetics to take effect before proceeding to the next step. Local Anesthetic: Lidocaine 2.0% The unused portion of the local anesthetic was discarded in the proper designated containers. Technical explanation of process:   L3 Medial Branch Nerve Block (MBB): The target area for the L3 medial branch is at the junction of the postero-lateral aspect of the superior articular process and the superior, posterior, and medial edge of the transverse process of L4. Under fluoroscopic guidance, a Quincke needle was inserted until contact was made with os over the superior postero-lateral aspect of the pedicular shadow (target area). After negative aspiration for blood, 38m of the nerve block solution was injected without difficulty or complication. The needle was removed intact. L4 Medial Branch Nerve Block (MBB): The target area for the L4 medial branch is at the junction of the postero-lateral aspect of the superior articular process and the superior, posterior, and medial edge of the transverse process of L5. Under fluoroscopic guidance, a  Quincke needle was inserted until contact was made with os over the superior postero-lateral aspect of the pedicular shadow (target area). After negative aspiration for blood, 2672mof the nerve block solution was injected without difficulty or complication. The needle was removed intact. L5 Medial Branch Nerve Block (MBB): The target area for the L5 medial branch is at the junction of the postero-lateral aspect of the superior articular process and the superior, posterior, and medial edge of the sacral ala. Under fluoroscopic guidance, a Quincke needle was inserted until contact was made with os over the superior postero-lateral aspect of the pedicular shadow (target area). After negative aspiration for blood, 72m44mf the nerve block solution was injected without difficulty or complication. The needle was removed intact.  Nerve block solution: 12 cc solution made of 10 cc of 0.2% ropivacaine, 2 cc of Decadron 10 mg/cc.  2 cc injected each level above bilaterally.    Procedural Needles: 22-gauge, 5-inch, Quincke needles used for all levels.  Once the entire procedure was completed, the treated area was cleaned, making sure to leave some of the prepping solution back to take advantage of its long term bactericidal properties.      Illustration of the posterior view of the lumbar spine and the posterior neural structures. Laminae of L2 through S1 are labeled. DPRL5, dorsal primary ramus of L5; DPRS1, dorsal primary ramus of S1; DPR3, dorsal primary ramus of L3; FJ, facet (zygapophyseal) joint L3-L4; I, inferior articular process of L4; LB1, lateral branch of dorsal primary ramus of L1; IAB, inferior articular branches from L3 medial branch (supplies L4-L5 facet joint); IBP, intermediate branch plexus; MB3, medial branch of dorsal primary ramus of L3; NR3, third lumbar nerve root; S, superior articular process of L5; SAB, superior articular branches from L4 (supplies L4-5 facet joint also); TP3, transverse  process of L3.  Vitals:   10/04/20 1207 10/04/20 1211 10/04/20 1216 10/04/20 1218  BP: (!) 126/91 (!) 124/91 133/86 131/90  Pulse:      Resp: '12 12 12 16  '$ Temp:  SpO2: 94% 95% 95% 98%  Weight:      Height:         Start Time: 1200 hrs. End Time: 1218 hrs.  Imaging Guidance (Spinal):          Type of Imaging Technique: Fluoroscopy Guidance (Spinal) Indication(s): Assistance in needle guidance and placement for procedures requiring needle placement in or near specific anatomical locations not easily accessible without such assistance. Exposure Time: Please see nurses notes. Contrast: None used. Fluoroscopic Guidance: I was personally present during the use of fluoroscopy. "Tunnel Vision Technique" used to obtain the best possible view of the target area. Parallax error corrected before commencing the procedure. "Direction-depth-direction" technique used to introduce the needle under continuous pulsed fluoroscopy. Once target was reached, antero-posterior, oblique, and lateral fluoroscopic projection used confirm needle placement in all planes. Images permanently stored in EMR. Interpretation: No contrast injected. I personally interpreted the imaging intraoperatively. Adequate needle placement confirmed in multiple planes. Permanent images saved into the patient's record.   Post-operative Assessment:  Post-procedure Vital Signs:  Pulse/HCG Rate: (!) 45(!) 46 Temp: (!) 97 F (36.1 C) Resp: 16 BP: 131/90 SpO2: 98 %  EBL: None  Complications: No immediate post-treatment complications observed by team, or reported by patient.  Note: The patient tolerated the entire procedure well. A repeat set of vitals were taken after the procedure and the patient was kept under observation following institutional policy, for this type of procedure. Post-procedural neurological assessment was performed, showing return to baseline, prior to discharge. The patient was provided with post-procedure  discharge instructions, including a section on how to identify potential problems. Should any problems arise concerning this procedure, the patient was given instructions to immediately contact us, at any time, without hesitation. In any case, we plan to contact the patient by telephone for a follow-up status report regarding this interventional procedure.  Comments:  No additional relevant information.  Plan of Care  Patient instructed to restart Xarelto tomorrow so long as he is not having any lower extremity weakness. Consider RFA next   Orders:  Orders Placed This Encounter  Procedures   DG PAIN CLINIC C-ARM 1-60 MIN NO REPORT    Intraoperative interpretation by procedural physician at [place].    Standing Status:   Standing    Number of Occurrences:   1    Order Specific Question:   Reason for exam:    Answer:   Assistance in needle guidance and placement for procedures requiring needle placement in or near specific anatomical locations not easily accessible without such assistance.   Chronic Opioid Analgesic:  Tramadol 50 mg 4 times daily as needed, quantity 120/month; MME equals 20    Medications ordered for procedure: Meds ordered this encounter  Medications   lidocaine (XYLOCAINE) 2 % (with pres) injection 400 mg   diazepam (VALIUM) tablet 10 mg    Make sure Flumazenil is available in the pyxis when using this medication. If oversedation occurs, administer 0.2 mg IV over 15 sec. If after 45 sec no response, administer 0.2 mg again over 1 min; may repeat at 1 min intervals; not to exceed 4 doses (1 mg)   dexamethasone (DECADRON) injection 10 mg   dexamethasone (DECADRON) injection 10 mg   ropivacaine (PF) 2 mg/mL (0.2%) (NAROPIN) injection 9 mL   ropivacaine (PF) 2 mg/mL (0.2%) (NAROPIN) injection 9 mL   Medications administered: We administered lidocaine, diazepam, dexamethasone, dexamethasone, ropivacaine (PF) 2 mg/mL (0.2%), and ropivacaine (PF) 2 mg/mL  (0.2%).  See the medical record for exact dosing, route, and time of administration.  Follow-up plan:   Return in about 6 weeks (around 11/15/2020) for Post Procedure Evaluation, virtual.      s/p lesi #1 (left L4/5) on 8/17, #2 on 10/21/2018 (left L3/4), #3 on 12/09/2018 (Left L3/4); 02/24/2019: Right wrist injection and left trigger finger (middle finger) injection.,  Bilateral wrist injection 10/15/2019, bilateral knee Hyalgan No. 1 01/26/1960. #2 02/23/2020, #3 03/24/2020.  Endorsing approximately 75 to 80% pain relief for bilateral knee pain.       Recent Visits Date Type Provider Dept  09/27/20 Telemedicine Gillis Santa, MD [facility]  08/16/20 Procedure visit Gillis Santa, MD [facility]  07/22/20 Office Visit Gillis Santa, MD [facility]  Showing recent visits within past 90 days and meeting all other requirements Today's Visits Date Type Provider Dept  10/04/20 Procedure visit Gillis Santa, MD [facility]  Showing today's visits and meeting all other requirements Future Appointments Date Type Provider Dept  11/15/20 Appointment Gillis Santa, MD [facility]  Showing future appointments within next 90 days and meeting all other requirements Disposition: Discharge home  Discharge (Date  Time): 10/04/2020; 1221 hrs.   Primary Care Physician: Olin Hauser, DO Location: [facility] Note by: Gillis Santa, MD Date: 10/04/2020; Time: 1:14 PM  Disclaimer:  Medicine is not an Chief Strategy Officer. The only guarantee in medicine is that nothing is guaranteed. It is important to note that the decision to proceed with this intervention was based on the information collected from the patient. The Data and conclusions were drawn from the patient's questionnaire, the interview, and the physical examination. Because the information was provided in large part by the patient, it cannot be guaranteed that it has not been  purposely or unconsciously manipulated. Every effort has been made to obtain as much relevant data as possible for this evaluation. It is important to note that the conclusions that lead to this procedure are derived in large part from the available data. Always take into account that the treatment will also be dependent on availability of resources and existing treatment guidelines, considered by other Pain Management Practitioners as being common knowledge and practice, at the time of the intervention. For Medico-Legal purposes, it is also important to point out that variation in procedural techniques and pharmacological choices are the acceptable norm. The indications, contraindications, technique, and results of the above procedure should only be interpreted and judged by a Board-Certified Interventional Pain Specialist with extensive familiarity and expertise in the same exact procedure and technique.

## 2020-10-05 ENCOUNTER — Telehealth: Payer: Self-pay

## 2020-10-05 NOTE — Telephone Encounter (Signed)
Post procedure phone call. Patient states he is doing well.  

## 2020-10-25 ENCOUNTER — Telehealth: Payer: Self-pay

## 2020-10-25 MED ORDER — RIVAROXABAN 20 MG PO TABS: 20.0000 mg | ORAL_TABLET | Freq: Every day | ORAL | 1 refills | Status: DC

## 2020-10-25 NOTE — Telephone Encounter (Signed)
Prescription refill request for Xarelto received.  Indication:afib Last office visit: gollan 07/28/20 Weight:161kg Age:54m Scr:1.21 07/21/20 CrCl:134.9

## 2020-10-25 NOTE — Telephone Encounter (Signed)
-----   Message from Janan Ridge, [place] sent at 10/25/2020  4:25 PM EDT ----- Regarding: Xarelto refill Pharmacy requesting refill for Xarelto 20MG   Please send too:  [place] [address]

## 2020-10-28 ENCOUNTER — Other Ambulatory Visit: Payer: Self-pay | Admitting: Student in an Organized Health Care Education/Training Program

## 2020-10-28 DIAGNOSIS — G8929 Other chronic pain: Secondary | ICD-10-CM

## 2020-10-28 DIAGNOSIS — M25562 Pain in left knee: Secondary | ICD-10-CM

## 2020-10-28 DIAGNOSIS — G894 Chronic pain syndrome: Secondary | ICD-10-CM

## 2020-10-28 DIAGNOSIS — M170 Bilateral primary osteoarthritis of knee: Secondary | ICD-10-CM

## 2020-11-04 ENCOUNTER — Other Ambulatory Visit: Payer: Self-pay | Admitting: Family Medicine

## 2020-11-04 DIAGNOSIS — H6981 Other specified disorders of Eustachian tube, right ear: Secondary | ICD-10-CM

## 2020-11-05 NOTE — Telephone Encounter (Signed)
Requested Prescriptions  Pending Prescriptions Disp Refills  . fluticasone (FLONASE) 50 MCG/ACT nasal spray [Pharmacy Med Name: FLUTICASONE PROP 50 MCG SPRAY] 16 mL 3    Sig: SPRAY 2 SPRAYS INTO EACH NOSTRIL EVERY DAY     Ear, Nose, and Throat: Nasal Preparations - Corticosteroids Passed - 11/04/2020  1:34 PM      Passed - Valid encounter within last 12 months    Recent Outpatient Visits          3 months ago Morbid obesity with BMI of 50.0-59.9, adult [facility])   Diggins, DO   8 months ago Acute dysfunction of right eustachian tube   Jerome, DO   1 year ago Insomnia, unspecified type   [facility] Kathrine Haddock, NP   1 year ago Essential hypertension   [facility] Volney American, [place]   1 year ago Essential hypertension   [place], Lilia Argue, [place]      Future Appointments            In 9 months [place] Ranger, Devonne Doughty, DO [facility], [facility]

## 2020-11-15 ENCOUNTER — Other Ambulatory Visit: Payer: Self-pay | Admitting: Family Medicine

## 2020-11-15 ENCOUNTER — Encounter: Payer: Self-pay | Admitting: Student in an Organized Health Care Education/Training Program

## 2020-11-15 ENCOUNTER — Ambulatory Visit
Payer: Medicare Other | Attending: Student in an Organized Health Care Education/Training Program | Admitting: Student in an Organized Health Care Education/Training Program

## 2020-11-15 ENCOUNTER — Other Ambulatory Visit: Payer: Self-pay

## 2020-11-15 DIAGNOSIS — M19031 Primary osteoarthritis, right wrist: Secondary | ICD-10-CM

## 2020-11-15 DIAGNOSIS — G894 Chronic pain syndrome: Secondary | ICD-10-CM

## 2020-11-15 DIAGNOSIS — M47816 Spondylosis without myelopathy or radiculopathy, lumbar region: Secondary | ICD-10-CM

## 2020-11-15 DIAGNOSIS — M6530 Trigger finger, unspecified finger: Secondary | ICD-10-CM | POA: Diagnosis not present

## 2020-11-15 DIAGNOSIS — M170 Bilateral primary osteoarthritis of knee: Secondary | ICD-10-CM

## 2020-11-15 MED ORDER — TRAMADOL HCL 50 MG PO TABS: 50.0000 mg | ORAL_TABLET | Freq: Four times a day (QID) | ORAL | 2 refills | Status: AC | PRN

## 2020-11-15 MED ORDER — TRAMADOL HCL 50 MG PO TABS: 50.0000 mg | ORAL_TABLET | Freq: Four times a day (QID) | ORAL | 2 refills | Status: DC | PRN

## 2020-11-15 NOTE — Progress Notes (Signed)
Patient: [name]  Service Category: E/M  Provider: Gillis Santa, MD  DOB: [birth year]  DOS: 11/15/2020  Location: Office  MRN: [id number]  Setting: Ambulatory outpatient  Referring Provider: Nobie Putnam *  Type: Established Patient  Specialty: Interventional Pain Management  PCP: Olin Hauser, DO  Location: Remote location  Delivery: TeleHealth     Virtual Encounter - Pain Management PROVIDER NOTE: Information contained herein reflects review and annotations entered in association with encounter. Interpretation of such information and data should be left to medically-trained personnel. Information provided to patient can be located elsewhere in the medical record under "Patient Instructions". Document created using STT-dictation technology, any transcriptional errors that may result from process are unintentional.    Contact & Pharmacy Preferred: [phone] Home: [phone] (home) Mobile: [phone] (mobile) E-mail: [email or web address]  [id number] [place] [address]Phone: [phone]: [phone] [id number] [address] [address]Phone: [phone]: [phone] [facility] [address]Phone: [phone]: [phone]  Pre-screening  [name] offered "in-person" vs "virtual" encounter. He indicated preferring virtual for this encounter.   Reason COVID-19*  Social distancing based on CDC and AMA recommendations.   I contacted Wayne Hansenon 11/15/2020 via telephone.      I clearly identified myself as BGillis Santa MD. I verified that I was speaking with the correct person using two identifiers (Name: [name] and date of birth: [birth year].  Consent I sought verbal advanced  consent from Wayne Hansenfor virtual visit interactions. I informed Mr. FBartlesonof possible security and privacy concerns, risks, and limitations associated with providing "not-in-person" medical evaluation and management services. I also informed Mr. FRutledgeof the availability of "in-person" appointments. Finally, I informed him that there would be a charge for the virtual visit and that he could be  personally, fully or partially, financially responsible for it. Mr. FRodeheaverexpressed understanding and agreed to proceed.   Historic Elements   Mr. RJHOVANI GRISWOLDis a 65y.o. year old, male patient evaluated today after our last contact on 10/28/2020. [name] has a past medical history of Cardiomyopathy (in setting of Afib), DJD (degenerative joint disease) of knee, History of kidney stones, Intervertebral disc disorder with radiculopathy of lumbosacral region, Kidney stone (2012), Lower extremity edema, PAF (paroxysmal atrial fibrillation) ([place], Pernicious anemia, Prostate cancer ([place], Pulmonary nodule, right, and SVT (supraventricular tachycardia) ([place]. He also  has a past surgical history that includes Knee surgery; Gastric bypass (2002); prostate seeding; Carpal tunnel release (Right, 12/23/2014); Carpal tunnel release (Left, 01/06/2015); Colonoscopy with propofol (N/A, 10/08/2017); and Cardioversion (N/A, 05/15/2019). Mr. FHunsuckerhas a current medication list which includes the following prescription(s): acetaminophen, amiodarone, benazepril, cyanocobalamin, cyclobenzaprine, ezetimibe, fluticasone, furosemide, gabapentin, ibuprofen, metoprolol succinate, multivitamin with minerals, rivaroxaban, rosuvastatin, shingrix, trazodone, and tramadol. He  reports that he quit smoking about 41 years ago. His smoking use included cigarettes. He has a 10.00 pack-year smoking history. He has quit using smokeless tobacco.  His smokeless tobacco use included snuff. He reports current alcohol use of about 12.0 standard  drinks per week. He reports that he does not use drugs. Mr. FSchreifelshas No Known Allergies.   HPI  Today, he is being contacted for both, medication management and a post-procedure assessment.  Patient was responding very well to his previous lumbar medial branch nerve blocks up until this weekend when he noted increased pain both on his right and left side.  This is still ongoing this morning.  He attributes it to a change in weather as he has not fallen or denies any inciting event.  Patient is also complaining of his right hand trigger finger.  Of note he received a left hand trigger finger injection almost a year ago which is still doing well.  We will also refill the patient's tramadol as below.  No change in dose.   Pharmacotherapy Assessment  Analgesic: Tramadol 50 mg 4 times daily as needed, quantity 120/month; MME equals 20    Monitoring: [name] PMP: PDMP reviewed during this encounter.       Pharmacotherapy: No side-effects or adverse reactions reported. Compliance: No problems identified. Effectiveness: Clinically acceptable.  UDS:  Summary  Date Value Ref Range Status  04/06/2020 Note  Final    Comment:    ==================================================================== ToxASSURE Select 13 (MW) ==================================================================== Test                             Result       Flag       Units  Drug Present and Declared for Prescription Verification   Tramadol                       >4386        EXPECTED   ng/mg creat   O-Desmethyltramadol            3353         EXPECTED   ng/mg creat   N-Desmethyltramadol            >4386        EXPECTED   ng/mg creat    Source of tramadol is a prescription medication. O-desmethyltramadol    and N-desmethyltramadol are expected metabolites of tramadol.  ==================================================================== Test                      Result    Flag   Units      Ref Range   Creatinine               114              mg/dL      >=20 ==================================================================== Declared Medications:  The flagging and interpretation on this report are based on the  following declared medications.  Unexpected results may arise from  inaccuracies in the declared medications.   **Note: The testing scope of this panel includes these medications:   Tramadol   **Note: The testing scope of this panel does not include the  following reported medications:   Acetaminophen (Tylenol)  Amiodarone  Amlodipine  Benazepril  Cyclobenzaprine (Flexeril)  Ezetimibe  Fluticasone  Furosemide  Gabapentin  Menthol  Methyl Salicylate (topical)  Metoprolol  Mirtazapine  Multivitamin  Rivaroxaban  Rosuvastatin  Trazodone  Vitamin B12 ==================================================================== For clinical consultation, please call [phone]. ====================================================================         Post-Procedure Evaluation  Procedure (10/04/2020):   Type: Lumbar Facet, Medial Branch Block(s) #2  Primary Purpose: Diagnostic Region: Posterolateral Lumbosacral Spine Level: L3, L4, L5, Medial Branch Level(s). Injecting these levels blocks the L3-4, L4-5, lumbar facet joints. Laterality: Bilateral  Anxiolysis: Please see nurses note.  Effectiveness during initial hour after procedure (Ultra-Short Term Relief): 100 %   Local anesthetic used: Long-acting (4-6 hours) Effectiveness: Defined as any analgesic benefit obtained secondary to the administration of local anesthetics. This carries significant diagnostic value as to the etiological location, or anatomical origin, of the pain. Duration of benefit is expected to coincide with the duration of the local anesthetic used.  Effectiveness during initial 4-6 hours after procedure (Short-Term Relief): 100 %   Long-term benefit: Defined as any relief past the pharmacologic duration of  the local anesthetics.  Effectiveness past the initial 6 hours after procedure (Long-Term Relief): 80 % (ongoing)   Benefits, current: Defined as benefit present at the time of this evaluation.   Analgesia:  70%-75% although states that this weekend started to have a flare up, we will continue to monitor    Pharmacotherapy Assessment   Analgesic: Tramadol 50 mg 4 times daily as needed, quantity 120/month; MME equals 20    Monitoring: [place] PMP: PDMP reviewed during this encounter.       Pharmacotherapy: No side-effects or adverse reactions reported. Compliance: No problems identified. Effectiveness: Clinically acceptable. Plan: Refer to "POC". UDS:  Summary  Date Value Ref Range Status  04/06/2020 Note  Final    Comment:    ==================================================================== ToxASSURE Select 13 (MW) ==================================================================== Test                             Result       Flag       Units  Drug Present and Declared for Prescription Verification   Tramadol                       >4386        EXPECTED   ng/mg creat   O-Desmethyltramadol            3353         EXPECTED   ng/mg creat   N-Desmethyltramadol            >4386        EXPECTED   ng/mg creat    Source of tramadol is a prescription medication. O-desmethyltramadol    and N-desmethyltramadol are expected metabolites of tramadol.  ==================================================================== Test                      Result    Flag   Units      Ref Range   Creatinine              114              mg/dL      >=20 ==================================================================== Declared Medications:  The flagging and interpretation on this report are based on the  following declared medications.  Unexpected results may arise from  inaccuracies in the declared medications.   **Note: The testing scope of this panel includes these medications:   Tramadol    **Note: The testing scope of this panel does not include the  following reported medications:   Acetaminophen (Tylenol)  Amiodarone  Amlodipine  Benazepril  Cyclobenzaprine (Flexeril)  Ezetimibe  Fluticasone  Furosemide  Gabapentin  Menthol  Methyl Salicylate (topical)  Metoprolol  Mirtazapine  Multivitamin  Rivaroxaban  Rosuvastatin  Trazodone  Vitamin B12 ==================================================================== For clinical consultation, please call [phone]. ====================================================================      Laboratory Chemistry Profile   Renal Lab  Results  Component Value Date   BUN 17 07/21/2020   CREATININE 1.21 41/93/7902   BCR NOT APPLICABLE 40/97/3532   GFRAA 72 07/21/2020   GFRNONAA 62 07/21/2020    Hepatic Lab Results  Component Value Date   AST 15 07/21/2020   ALT 11 07/21/2020   ALBUMIN 4.6 08/01/2019   ALKPHOS 72 08/01/2019   LIPASE 28 03/13/2017    Electrolytes Lab Results  Component Value Date   NA 143 07/21/2020   K 4.2 07/21/2020   CL 108 07/21/2020   CALCIUM 9.0 07/21/2020   MG 2.0 05/05/2019    Bone No results found for: VD25OH, VD125OH2TOT, DJ2426ST4, HD6222LN9, 25OHVITD1, 25OHVITD2, 25OHVITD3, TESTOFREE, TESTOSTERONE  Inflammation (CRP: Acute Phase) (ESR: Chronic Phase) No results found for: CRP, ESRSEDRATE, LATICACIDVEN       Note: Above Lab results reviewed.  Assessment  The primary encounter diagnosis was Trigger finger of right hand, unspecified finger. Diagnoses of Lumbar facet joint syndrome, Lumbar facet arthropathy, Bilateral primary osteoarthritis of knee, and Chronic pain syndrome were also pertinent to this visit.  Plan of Care   [name] has a current medication list which includes the following long-term medication(s): amiodarone, benazepril, ezetimibe, fluticasone, furosemide, gabapentin, metoprolol succinate, rivaroxaban, rosuvastatin, and  trazodone.  Pharmacotherapy (Medications Ordered): Meds ordered this encounter  Medications   DISCONTD: traMADol (ULTRAM) 50 MG tablet    Sig: Take 1 tablet (50 mg total) by mouth every 6 (six) hours as needed for severe pain.    Dispense:  120 tablet    Refill:  2    Fill one day early if pharmacy is closed on scheduled refill date. Do not fill until:  To last until:   traMADol (ULTRAM) 50 MG tablet    Sig: Take 1 tablet (50 mg total) by mouth every 6 (six) hours as needed for severe pain.    Dispense:  120 tablet    Refill:  2    Fill one day early if pharmacy is closed on scheduled refill date. Do not fill until: To last until:    Orders:  Orders Placed This Encounter  Procedures   Injection tendon or ligament    Standing Status:   Future    Standing Expiration Date:   02/15/2021    Scheduling Instructions:     Right trigger finger injection without sedation    Follow-up plan:   Return in about 1 week (around 11/22/2020) for Right hand trigger finger, without sedation.     s/p lesi #1 (left L4/5) on 8/17, #2 on 10/21/2018 (left L3/4), #3 on 12/09/2018 (Left L3/4); 02/24/2019: Right wrist injection and left trigger finger (middle finger) injection.,  Bilateral wrist injection 10/15/2019, bilateral knee Hyalgan No. 1 01/26/1960. #2 02/23/2020, #3 03/24/2020.  Endorsing approximately 75 to 80% pain relief for bilateral knee pain.        Recent Visits Date Type Provider Dept  10/04/20 Procedure visit Gillis Santa, MD [facility]  09/27/20 Telemedicine Gillis Santa, MD [facility]  Showing recent visits within past 90 days and meeting all other requirements Today's Visits Date Type Provider Dept  11/15/20 Office Visit Gillis Santa, MD [facility]  Showing today's visits and meeting all other requirements Future Appointments No visits were found meeting these conditions. Showing future appointments within next 90 days and meeting all other requirements I  discussed the assessment and treatment plan with the patient. The patient was provided an opportunity to ask questions and all were answered. The  patient agreed with the plan and demonstrated an understanding of the instructions.  Patient advised to call back or seek an in-person evaluation if the symptoms or condition worsens.  Duration of encounter: 30 minutes.  Note by: Gillis Santa, MD Date: 11/15/2020; Time: 1:31 PM

## 2020-11-15 NOTE — Telephone Encounter (Signed)
Requested medications are due for refill today.  yes  Requested medications are on the active medications list.  yes  Last refill. 07/06/2020  Future visit scheduled.   yes  Notes to clinic.  Medication not delegated.

## 2020-11-24 ENCOUNTER — Telehealth: Payer: Self-pay

## 2020-11-29 NOTE — Telephone Encounter (Signed)
error 

## 2020-12-01 ENCOUNTER — Ambulatory Visit (HOSPITAL_BASED_OUTPATIENT_CLINIC_OR_DEPARTMENT_OTHER): Payer: Medicare Other | Admitting: Student in an Organized Health Care Education/Training Program

## 2020-12-01 ENCOUNTER — Encounter: Payer: Self-pay | Admitting: Student in an Organized Health Care Education/Training Program

## 2020-12-01 ENCOUNTER — Other Ambulatory Visit: Payer: Self-pay

## 2020-12-01 ENCOUNTER — Ambulatory Visit
Admission: RE | Admit: 2020-12-01 | Discharge: 2020-12-01 | Disposition: A | Discharge status: Home / Self Care | Payer: Medicare Other | Source: Ambulatory Visit | Attending: Student in an Organized Health Care Education/Training Program | Admitting: Student in an Organized Health Care Education/Training Program

## 2020-12-01 VITALS — BP 110/90 | HR 50 | Temp 97.1°F | Resp 17 | Ht 72.0 in | Wt 350.0 lb

## 2020-12-01 DIAGNOSIS — G894 Chronic pain syndrome: Secondary | ICD-10-CM | POA: Insufficient documentation

## 2020-12-01 DIAGNOSIS — M47816 Spondylosis without myelopathy or radiculopathy, lumbar region: Secondary | ICD-10-CM | POA: Diagnosis present

## 2020-12-01 MED ORDER — DEXAMETHASONE SODIUM PHOSPHATE 10 MG/ML IJ SOLN
INTRAMUSCULAR | Status: AC
  Filled 2020-12-01: qty 2

## 2020-12-01 MED ORDER — DEXAMETHASONE SODIUM PHOSPHATE 10 MG/ML IJ SOLN
10.0000 mg | Freq: Once | INTRAMUSCULAR | Status: AC
  Administered 2020-12-01: 10 mg

## 2020-12-01 MED ORDER — LIDOCAINE HCL 2 % IJ SOLN
INTRAMUSCULAR | Status: AC
  Filled 2020-12-01: qty 80

## 2020-12-01 MED ORDER — ROPIVACAINE HCL 2 MG/ML IJ SOLN
9.0000 mL | Freq: Once | INTRAMUSCULAR | Status: AC
  Administered 2020-12-01: 9 mL via PERINEURAL

## 2020-12-01 MED ORDER — ROPIVACAINE HCL 2 MG/ML IJ SOLN
INTRAMUSCULAR | Status: AC
  Filled 2020-12-01: qty 20

## 2020-12-01 MED ORDER — DIAZEPAM 5 MG PO TABS
10.0000 mg | ORAL_TABLET | ORAL | Status: AC
  Administered 2020-12-01: 5 mg via ORAL

## 2020-12-01 MED ORDER — LIDOCAINE HCL 2 % IJ SOLN
20.0000 mL | Freq: Once | INTRAMUSCULAR | Status: AC
  Administered 2020-12-01: 200 mg

## 2020-12-01 MED ORDER — DIAZEPAM 5 MG PO TABS
ORAL_TABLET | ORAL | Status: AC
  Filled 2020-12-01: qty 2

## 2020-12-01 NOTE — Progress Notes (Signed)
Safety precautions to be maintained throughout the outpatient stay will include: orient to surroundings, keep bed in low position, maintain call bell within reach at all times, provide assistance with transfer out of bed and ambulation.  

## 2020-12-01 NOTE — Progress Notes (Signed)
PROVIDER NOTE: Information contained herein reflects review and annotations entered in association with encounter. Interpretation of such information and data should be left to medically-trained personnel. Information provided to patient can be located elsewhere in the medical record under "Patient Instructions". Document created using STT-dictation technology, any transcriptional errors that may result from process are unintentional.    Patient: [name]  Service Category: Procedure  Provider: Gillis Santa, MD  DOB: Dec 13, 1955  DOS: 12/01/2020  Location: [facility]  MRN: [id number]  Setting: Ambulatory - outpatient  Referring Provider: Nobie Putnam *  Type: Established Patient  Specialty: Interventional Pain Management  PCP: Olin Hauser, DO   Primary Reason for Visit: Interventional Pain Management Treatment. CC: Pain (Finger pain )  Procedure:          Anesthesia, Analgesia, Anxiolysis:  Type: Lumbar Facet, Medial Branch Block(s) #3  Primary Purpose: Therapeutic Region: Posterolateral Lumbosacral Spine Level: L3, L4, L5, Medial Branch Level(s). Injecting these levels blocks the L3-4, L4-5, lumbar facet joints. Laterality: Bilateral  Type: MINIMAL SEDATION 10 mg PO Valium Indication(s): Analgesia and Anxiety Route: Oral (PO) IV Access: Declined Sedation: Meaningful verbal contact was maintained at all times during the procedure  Local Anesthetic: Lidocaine 1-2%  Position: Prone   Indications: 1. Lumbar facet joint syndrome   2. Lumbar facet arthropathy   3. Chronic pain syndrome    Pain Score: Pre-procedure: 10-Worst pain ever/10 Post-procedure: 2 /10   Patient's last dose of Xarelto was Thursday  Pre-op H&P Assessment:  [name] is a 65 y.o. (year old), male patient, seen today for interventional treatment. He  has a past surgical history that includes Knee surgery; Gastric bypass (2002); prostate seeding; Carpal tunnel release  (Right, 12/23/2014); Carpal tunnel release (Left, 01/06/2015); Colonoscopy with propofol (N/A, 10/08/2017); and Cardioversion (N/A, 05/15/2019). [name] has a current medication list which includes the following prescription(s): acetaminophen, amiodarone, benazepril, cyanocobalamin, cyclobenzaprine, ezetimibe, fluticasone, furosemide, gabapentin, ibuprofen, metoprolol succinate, multivitamin with minerals, rosuvastatin, shingrix, tramadol, trazodone, and rivaroxaban. His primarily concern today is the Pain (Finger pain )  Initial Vital Signs:  Pulse/HCG Rate: (!) 50ECG Heart Rate: (!) 50 Temp: (!) 97.1 F (36.2 C) Resp: 16 BP: (!) 163/93 SpO2: 97 %  BMI: Estimated body mass index is 47.47 kg/m as calculated from the following:   Height as of this encounter: 6' (1.829 m).   Weight as of this encounter: 350 lb (158.8 kg).  Risk Assessment: Allergies: Reviewed. He has No Known Allergies.  Allergy Precautions: None required Coagulopathies: Reviewed. None identified.  Blood-thinner therapy: None at this time Active Infection(s): Reviewed. None identified. [name] is afebrile  Site Confirmation: [name] was asked to confirm the procedure and laterality before marking the site Procedure checklist: Completed Consent: Before the procedure and under the influence of no sedative(s), amnesic(s), or anxiolytics, the patient was informed of the treatment options, risks and possible complications. To fulfill our ethical and legal obligations, as recommended by the American Medical Association's Code of Ethics, I have informed the patient of my clinical impression; the nature and purpose of the treatment or procedure; the risks, benefits, and possible complications of the intervention; the alternatives, including doing nothing; the risk(s) and benefit(s) of the alternative treatment(s) or procedure(s); and the risk(s) and benefit(s) of doing nothing. The patient was provided information about the  general risks and possible complications associated with the procedure. These may include, but are not limited to: failure to achieve desired goals, infection, bleeding, organ or nerve  damage, allergic reactions, paralysis, and death. In addition, the patient was informed of those risks and complications associated to Spine-related procedures, such as failure to decrease pain; infection (i.e.: Meningitis, epidural or intraspinal abscess); bleeding (i.e.: epidural hematoma, subarachnoid hemorrhage, or any other type of intraspinal or peri-dural bleeding); organ or nerve damage (i.e.: Any type of peripheral nerve, nerve root, or spinal cord injury) with subsequent damage to sensory, motor, and/or autonomic systems, resulting in permanent pain, numbness, and/or weakness of one or several areas of the body; allergic reactions; (i.e.: anaphylactic reaction); and/or death. Furthermore, the patient was informed of those risks and complications associated with the medications. These include, but are not limited to: allergic reactions (i.e.: anaphylactic or anaphylactoid reaction(s)); adrenal axis suppression; blood sugar elevation that in diabetics may result in ketoacidosis or comma; water retention that in patients with history of congestive heart failure may result in shortness of breath, pulmonary edema, and decompensation with resultant heart failure; weight gain; swelling or edema; medication-induced neural toxicity; particulate matter embolism and blood vessel occlusion with resultant organ, and/or nervous system infarction; and/or aseptic necrosis of one or more joints. Finally, the patient was informed that Medicine is not an exact science; therefore, there is also the possibility of unforeseen or unpredictable risks and/or possible complications that may result in a catastrophic outcome. The patient indicated having understood very clearly. We have given the patient no guarantees and we have made no promises.  Enough time was given to the patient to ask questions, all of which were answered to the patient's satisfaction. [name] has indicated that he wanted to continue with the procedure. Attestation: I, the ordering provider, attest that I have discussed with the patient the benefits, risks, side-effects, alternatives, likelihood of achieving goals, and potential problems during recovery for the procedure that I have provided informed consent. Date  Time: 12/01/2020 11:08 AM  Pre-Procedure Preparation:  Monitoring: As per clinic protocol. Respiration, ETCO2, SpO2, BP, heart rate and rhythm monitor placed and checked for adequate function Safety Precautions: Patient was assessed for positional comfort and pressure points before starting the procedure. Time-out: I initiated and conducted the "Time-out" before starting the procedure, as per protocol. The patient was asked to participate by confirming the accuracy of the "Time Out" information. Verification of the correct person, site, and procedure were performed and confirmed by me, the nursing staff, and the patient. "Time-out" conducted as per Joint Commission's Universal Protocol (UP.01.01.01). Time: 1156  Description of Procedure:          Laterality: Bilateral. The procedure was performed in identical fashion on both sides. Levels:  L3, L4, L5,  Medial Branch Level(s) Area Prepped: Posterior Lumbosacral Region DuraPrep (Iodine Povacrylex [0.7% available iodine] and Isopropyl Alcohol, 74% w/w) Safety Precautions: Aspiration looking for blood return was conducted prior to all injections. At no point did we inject any substances, as a needle was being advanced. Before injecting, the patient was told to immediately notify me if he was experiencing any new onset of "ringing in the ears, or metallic taste in the mouth". No attempts were made at seeking any paresthesias. Safe injection practices and needle disposal techniques used. Medications properly  checked for expiration dates. SDV (single dose vial) medications used. After the completion of the procedure, all disposable equipment used was discarded in the proper designated medical waste containers. Local Anesthesia: Protocol guidelines were followed. The patient was positioned over the fluoroscopy table. The area was prepped in the usual manner. The time-out was completed. The  target area was identified using fluoroscopy. A 12-in long, straight, sterile hemostat was used with fluoroscopic guidance to locate the targets for each level blocked. Once located, the skin was marked with an approved surgical skin marker. Once all sites were marked, the skin (epidermis, dermis, and hypodermis), as well as deeper tissues (fat, connective tissue and muscle) were infiltrated with a small amount of a short-acting local anesthetic, loaded on a 10cc syringe with a 25G, 1.5-in  Needle. An appropriate amount of time was allowed for local anesthetics to take effect before proceeding to the next step. Local Anesthetic: Lidocaine 2.0% The unused portion of the local anesthetic was discarded in the proper designated containers. Technical explanation of process:   L3 Medial Branch Nerve Block (MBB): The target area for the L3 medial branch is at the junction of the postero-lateral aspect of the superior articular process and the superior, posterior, and medial edge of the transverse process of L4. Under fluoroscopic guidance, a Quincke needle was inserted until contact was made with os over the superior postero-lateral aspect of the pedicular shadow (target area). After negative aspiration for blood, 44mL of the nerve block solution was injected without difficulty or complication. The needle was removed intact. L4 Medial Branch Nerve Block (MBB): The target area for the L4 medial branch is at the junction of the postero-lateral aspect of the superior articular process and the superior, posterior, and medial edge of the  transverse process of L5. Under fluoroscopic guidance, a Quincke needle was inserted until contact was made with os over the superior postero-lateral aspect of the pedicular shadow (target area). After negative aspiration for blood, 74mL of the nerve block solution was injected without difficulty or complication. The needle was removed intact. L5 Medial Branch Nerve Block (MBB): The target area for the L5 medial branch is at the junction of the postero-lateral aspect of the superior articular process and the superior, posterior, and medial edge of the sacral ala. Under fluoroscopic guidance, a Quincke needle was inserted until contact was made with os over the superior postero-lateral aspect of the pedicular shadow (target area). After negative aspiration for blood, 32mL of the nerve block solution was injected without difficulty or complication. The needle was removed intact.  Nerve block solution: 12 cc solution made of 10 cc of 0.2% ropivacaine, 2 cc of Decadron 10 mg/cc.  2 cc injected each level above bilaterally.    Procedural Needles: 22-gauge, 5-inch, Quincke needles used for all levels.  Once the entire procedure was completed, the treated area was cleaned, making sure to leave some of the prepping solution back to take advantage of its long term bactericidal properties.      Illustration of the posterior view of the lumbar spine and the posterior neural structures. Laminae of L2 through S1 are labeled. DPRL5, dorsal primary ramus of L5; DPRS1, dorsal primary ramus of S1; DPR3, dorsal primary ramus of L3; FJ, facet (zygapophyseal) joint L3-L4; I, inferior articular process of L4; LB1, lateral branch of dorsal primary ramus of L1; IAB, inferior articular branches from L3 medial branch (supplies L4-L5 facet joint); IBP, intermediate branch plexus; MB3, medial branch of dorsal primary ramus of L3; NR3, third lumbar nerve root; S, superior articular process of L5; SAB, superior articular branches  from L4 (supplies L4-5 facet joint also); TP3, transverse process of L3.  Vitals:   12/01/20 1156 12/01/20 1201 12/01/20 1206 12/01/20 1208  BP: (!) 150/114 133/74 133/75 110/90  Pulse:      Resp: 12  14 12 17   Temp:      TempSrc:      SpO2: 94% 94% 92% 95%  Weight:      Height:         Start Time: 1156 hrs. End Time: 1207 hrs.  Imaging Guidance (Spinal):          Type of Imaging Technique: Fluoroscopy Guidance (Spinal) Indication(s): Assistance in needle guidance and placement for procedures requiring needle placement in or near specific anatomical locations not easily accessible without such assistance. Exposure Time: Please see nurses notes. Contrast: None used. Fluoroscopic Guidance: I was personally present during the use of fluoroscopy. "Tunnel Vision Technique" used to obtain the best possible view of the target area. Parallax error corrected before commencing the procedure. "Direction-depth-direction" technique used to introduce the needle under continuous pulsed fluoroscopy. Once target was reached, antero-posterior, oblique, and lateral fluoroscopic projection used confirm needle placement in all planes. Images permanently stored in EMR. Interpretation: No contrast injected. I personally interpreted the imaging intraoperatively. Adequate needle placement confirmed in multiple planes. Permanent images saved into the patient's record.   Post-operative Assessment:  Post-procedure Vital Signs:  Pulse/HCG Rate: (!) 50(!) 48 Temp: (!) 97.1 F (36.2 C) Resp: 17 BP: 110/90 SpO2: 95 %  EBL: None  Complications: No immediate post-treatment complications observed by team, or reported by patient.  Note: The patient tolerated the entire procedure well. A repeat set of vitals were taken after the procedure and the patient was kept under observation following institutional policy, for this type of procedure. Post-procedural neurological assessment was performed, showing return to  baseline, prior to discharge. The patient was provided with post-procedure discharge instructions, including a section on how to identify potential problems. Should any problems arise concerning this procedure, the patient was given instructions to immediately contact us, at any time, without hesitation. In any case, we plan to contact the patient by telephone for a follow-up status report regarding this interventional procedure.  Comments:  No additional relevant information.  Plan of Care  Patient instructed to restart Xarelto tomorrow so long as he is not having any lower extremity weakness. Consider RFA next   Orders:  Orders Placed This Encounter  Procedures   DG PAIN [facility] C-ARM 1-60 MIN NO REPORT    Intraoperative interpretation by procedural physician at [facility].    Standing Status:   Standing    Number of Occurrences:   1    Order Specific Question:   Reason for exam:    Answer:   Assistance in needle guidance and placement for procedures requiring needle placement in or near specific anatomical locations not easily accessible without such assistance.    Chronic Opioid Analgesic:  Tramadol 50 mg 4 times daily as needed, quantity 120/month; MME equals 20    Medications ordered for procedure: Meds ordered this encounter  Medications   lidocaine (XYLOCAINE) 2 % (with pres) injection 400 mg   dexamethasone (DECADRON) injection 10 mg   dexamethasone (DECADRON) injection 10 mg   ropivacaine (PF) 2 mg/mL (0.2%) (NAROPIN) injection 9 mL   ropivacaine (PF) 2 mg/mL (0.2%) (NAROPIN) injection 9 mL   diazepam (VALIUM) tablet 10 mg    Make sure Flumazenil is available in the pyxis when using this medication. If oversedation occurs, administer 0.2 mg IV over 15 sec. If after 45 sec no response, administer 0.2 mg again over 1 min; may repeat at 1 min intervals; not to exceed 4 doses (1 mg)    Medications administered: We  administered lidocaine, dexamethasone,  dexamethasone, ropivacaine (PF) 2 mg/mL (0.2%), ropivacaine (PF) 2 mg/mL (0.2%), and diazepam.  See the medical record for exact dosing, route, and time of administration.  Follow-up plan:   Return for Keep sch. appt.      s/p lesi #1 (left L4/5) on 8/17, #2 on 10/21/2018 (left L3/4), #3 on 12/09/2018 (Left L3/4); 02/24/2019: Right wrist injection and left trigger finger (middle finger) injection.,  Bilateral wrist injection 10/15/2019, bilateral knee Hyalgan No. 1 01/26/1960. #2 02/23/2020, #3 03/24/2020.  Endorsing approximately 75 to 80% pain relief for bilateral knee pain.       Recent Visits Date Type Provider Dept  11/15/20 Office Visit Gillis Santa, MD [facility]  10/04/20 Procedure visit Gillis Santa, MD [facility]  09/27/20 Telemedicine Gillis Santa, MD [facility]  Showing recent visits within past 90 days and meeting all other requirements Today's Visits Date Type Provider Dept  12/01/20 Procedure visit Gillis Santa, MD [facility]  Showing today's visits and meeting all other requirements Future Appointments Date Type Provider Dept  12/15/20 Appointment Gillis Santa, MD [facility]  02/01/21 Appointment Gillis Santa, MD [facility]  Showing future appointments within next 90 days and meeting all other requirements Disposition: Discharge home  Discharge (Date  Time): 12/01/2020; 1217 hrs.   Primary Care Physician: Olin Hauser, DO Location: [facility] Note by: Gillis Santa, MD Date: 12/01/2020; Time: 1:05 PM  Disclaimer:  Medicine is not an exact science. The only guarantee in medicine is that nothing is guaranteed. It is important to note that the decision to proceed with this intervention was based on the information collected from the patient. The Data and conclusions were drawn from the patient's questionnaire, the interview, and the physical examination. Because the  information was provided in large part by the patient, it cannot be guaranteed that it has not been purposely or unconsciously manipulated. Every effort has been made to obtain as much relevant data as possible for this evaluation. It is important to note that the conclusions that lead to this procedure are derived in large part from the available data. Always take into account that the treatment will also be dependent on availability of resources and existing treatment guidelines, considered by other Pain Management Practitioners as being common knowledge and practice, at the time of the intervention. For Medico-Legal purposes, it is also important to point out that variation in procedural techniques and pharmacological choices are the acceptable norm. The indications, contraindications, technique, and results of the above procedure should only be interpreted and judged by a Board-Certified Interventional Pain Specialist with extensive familiarity and expertise in the same exact procedure and technique.

## 2020-12-02 ENCOUNTER — Telehealth: Payer: Self-pay | Admitting: *Deleted

## 2020-12-02 NOTE — Telephone Encounter (Signed)
Denies any post procedure issues. 

## 2020-12-06 ENCOUNTER — Telehealth: Payer: Self-pay

## 2020-12-06 DIAGNOSIS — M47816 Spondylosis without myelopathy or radiculopathy, lumbar region: Secondary | ICD-10-CM

## 2020-12-06 DIAGNOSIS — G894 Chronic pain syndrome: Secondary | ICD-10-CM

## 2020-12-06 NOTE — Telephone Encounter (Signed)
When [name] returned home from last procedure he was feeling good and pain was relieved.  On Friday the pain gradually started coming back.  He is having difficulty walking, getting comfortable and  this is all consuming.  Patient is wondering if he should go to orthopedic.  He has not had an x-rays since the fall that we discussed last week.

## 2020-12-06 NOTE — Telephone Encounter (Signed)
Pt is in a lot of pain and wants to know should he go to a ortho dr.

## 2020-12-07 NOTE — Telephone Encounter (Signed)
Called patient back to let him know that Dr Holley Raring has ordered x-rays of his lumbar spine.  He will have these done and if he is unable to see in my-chart he will call for results.

## 2020-12-08 ENCOUNTER — Ambulatory Visit
Admission: RE | Admit: 2020-12-08 | Discharge: 2020-12-08 | Disposition: A | Discharge status: Home / Self Care | Payer: Medicare Other | Source: Ambulatory Visit | Attending: Student in an Organized Health Care Education/Training Program | Admitting: Student in an Organized Health Care Education/Training Program

## 2020-12-08 DIAGNOSIS — M47816 Spondylosis without myelopathy or radiculopathy, lumbar region: Secondary | ICD-10-CM | POA: Diagnosis present

## 2020-12-08 DIAGNOSIS — G894 Chronic pain syndrome: Secondary | ICD-10-CM | POA: Insufficient documentation

## 2020-12-15 ENCOUNTER — Ambulatory Visit
Admission: RE | Admit: 2020-12-15 | Discharge: 2020-12-15 | Disposition: A | Discharge status: Home / Self Care | Payer: Medicare Other | Source: Ambulatory Visit | Attending: Student in an Organized Health Care Education/Training Program | Admitting: Student in an Organized Health Care Education/Training Program

## 2020-12-15 ENCOUNTER — Ambulatory Visit (HOSPITAL_BASED_OUTPATIENT_CLINIC_OR_DEPARTMENT_OTHER): Payer: Medicare Other | Admitting: Student in an Organized Health Care Education/Training Program

## 2020-12-15 ENCOUNTER — Other Ambulatory Visit: Payer: Self-pay

## 2020-12-15 ENCOUNTER — Encounter: Payer: Self-pay | Admitting: Student in an Organized Health Care Education/Training Program

## 2020-12-15 VITALS — BP 136/88 | HR 50 | Temp 97.1°F | Resp 18 | Ht 72.0 in | Wt 355.0 lb

## 2020-12-15 DIAGNOSIS — G894 Chronic pain syndrome: Secondary | ICD-10-CM

## 2020-12-15 DIAGNOSIS — M47816 Spondylosis without myelopathy or radiculopathy, lumbar region: Secondary | ICD-10-CM | POA: Diagnosis present

## 2020-12-15 MED ORDER — ROPIVACAINE HCL 2 MG/ML IJ SOLN
INTRAMUSCULAR | Status: AC
  Filled 2020-12-15: qty 20

## 2020-12-15 MED ORDER — DEXAMETHASONE SODIUM PHOSPHATE 10 MG/ML IJ SOLN
10.0000 mg | Freq: Once | INTRAMUSCULAR | Status: AC
  Administered 2020-12-15: 10 mg
  Filled 2020-12-15: qty 1

## 2020-12-15 MED ORDER — DEXAMETHASONE SODIUM PHOSPHATE 10 MG/ML IJ SOLN
10.0000 mg | Freq: Once | INTRAMUSCULAR | Status: AC
  Administered 2020-12-15: 10 mg

## 2020-12-15 MED ORDER — ROPIVACAINE HCL 2 MG/ML IJ SOLN
9.0000 mL | Freq: Once | INTRAMUSCULAR | Status: AC
  Administered 2020-12-15: 9 mL via PERINEURAL

## 2020-12-15 MED ORDER — LIDOCAINE HCL 2 % IJ SOLN
20.0000 mL | Freq: Once | INTRAMUSCULAR | Status: AC
  Administered 2020-12-15: 400 mg

## 2020-12-15 MED ORDER — IOHEXOL 180 MG/ML  SOLN: 10.0000 mL | Freq: Once | INTRAMUSCULAR | Status: DC

## 2020-12-15 MED ORDER — LIDOCAINE HCL 2 % IJ SOLN
INTRAMUSCULAR | Status: AC
  Filled 2020-12-15: qty 10

## 2020-12-15 MED ORDER — DEXAMETHASONE SODIUM PHOSPHATE 10 MG/ML IJ SOLN
INTRAMUSCULAR | Status: AC
  Filled 2020-12-15: qty 2

## 2020-12-15 NOTE — Progress Notes (Signed)
PROVIDER NOTE: Information contained herein reflects review and annotations entered in association with encounter. Interpretation of such information and data should be left to medically-trained personnel. Information provided to patient can be located elsewhere in the medical record under "Patient Instructions". Document created using STT-dictation technology, any transcriptional errors that may result from process are unintentional.    Patient: [name]  Service Category: Procedure  Provider: Gillis Santa, MD  DOB: 30-Jul-1955  DOS: 12/15/2020  Location: [facility]  MRN: [id number]  Setting: Ambulatory - outpatient  Referring Provider: Nobie Hill *  Type: Established Patient  Specialty: Interventional Pain Management  PCP: Wayne Hauser, DO   Primary Reason for Visit: Interventional Pain Management Treatment. CC: Pain (RIGHT middle) and Back Pain (Lower, RIGHT)  Procedure:          Anesthesia, Analgesia, Anxiolysis:  Type: Lumbar Facet, Medial Branch Block(s) #4  Primary Purpose: Therapeutic Region: Posterolateral Lumbosacral Spine Level: L3, L4, L5, Medial Branch Level(s). Injecting these levels blocks the L3-4, L4-5, lumbar facet joints. Laterality: Bilateral  Local only Local Anesthetic: Lidocaine 1-2%  Position: Prone   Indications: 1. Lumbar facet joint syndrome   2. Lumbar facet arthropathy   3. Chronic pain syndrome    Pain Score: Pre-procedure: 5 /10 Post-procedure: 4  (his back feels great other than the right side which he rates at a 4)/10   Patient's last dose of Xarelto was Friday  Pre-op H&P Assessment:  [name] is a 65 y.o. (year old), male patient, seen today for interventional treatment. He  has a past surgical history that includes Knee surgery; Gastric bypass (2002); prostate seeding; Carpal tunnel release (Right, 12/23/2014); Carpal tunnel release (Left, 01/06/2015); Colonoscopy with propofol (N/A, 10/08/2017); and  Cardioversion (N/A, 05/15/2019). [name] has a current medication list which includes the following prescription(s): acetaminophen, amiodarone, benazepril, cyanocobalamin, cyclobenzaprine, ezetimibe, fluticasone, furosemide, gabapentin, ibuprofen, metoprolol succinate, multivitamin with minerals, rosuvastatin, tramadol, trazodone, rivaroxaban, and shingrix, and the following Facility-Administered Medications: iohexol. His primarily concern today is the Pain (RIGHT middle) and Back Pain (Lower, RIGHT)  Initial Vital Signs:  Pulse/HCG Rate: (!) 50ECG Heart Rate: (!) 48 Temp: (!) 97.1 F (36.2 C) Resp: 15 BP: (!) 151/89 SpO2: 97 %  BMI: Estimated body mass index is 48.15 kg/m as calculated from the following:   Height as of this encounter: 6' (1.829 m).   Weight as of this encounter: 355 lb (161 kg).  Risk Assessment: Allergies: Reviewed. He has No Known Allergies.  Allergy Precautions: None required Coagulopathies: Reviewed. None identified.  Blood-thinner therapy: None at this time Active Infection(s): Reviewed. None identified. [name] is afebrile  Site Confirmation: [name] was asked to confirm the procedure and laterality before marking the site Procedure checklist: Completed Consent: Before the procedure and under the influence of no sedative(s), amnesic(s), or anxiolytics, the patient was informed of the treatment options, risks and possible complications. To fulfill our ethical and legal obligations, as recommended by the American Medical Association's Code of Ethics, I have informed the patient of my clinical impression; the nature and purpose of the treatment or procedure; the risks, benefits, and possible complications of the intervention; the alternatives, including doing nothing; the risk(s) and benefit(s) of the alternative treatment(s) or procedure(s); and the risk(s) and benefit(s) of doing nothing. The patient was provided information about the general risks and  possible complications associated with the procedure. These may include, but are not limited to: failure to achieve desired goals, infection, bleeding, organ or nerve  damage, allergic reactions, paralysis, and death. In addition, the patient was informed of those risks and complications associated to Spine-related procedures, such as failure to decrease pain; infection (i.e.: Meningitis, epidural or intraspinal abscess); bleeding (i.e.: epidural hematoma, subarachnoid hemorrhage, or any other type of intraspinal or peri-dural bleeding); organ or nerve damage (i.e.: Any type of peripheral nerve, nerve root, or spinal cord injury) with subsequent damage to sensory, motor, and/or autonomic systems, resulting in permanent pain, numbness, and/or weakness of one or several areas of the body; allergic reactions; (i.e.: anaphylactic reaction); and/or death. Furthermore, the patient was informed of those risks and complications associated with the medications. These include, but are not limited to: allergic reactions (i.e.: anaphylactic or anaphylactoid reaction(s)); adrenal axis suppression; blood sugar elevation that in diabetics may result in ketoacidosis or comma; water retention that in patients with history of congestive heart failure may result in shortness of breath, pulmonary edema, and decompensation with resultant heart failure; weight gain; swelling or edema; medication-induced neural toxicity; particulate matter embolism and blood vessel occlusion with resultant organ, and/or nervous system infarction; and/or aseptic necrosis of one or more joints. Finally, the patient was informed that Medicine is not an exact science; therefore, there is also the possibility of unforeseen or unpredictable risks and/or possible complications that may result in a catastrophic outcome. The patient indicated having understood very clearly. We have given the patient no guarantees and we have made no promises. Enough time was  given to the patient to ask questions, all of which were answered to the patient's satisfaction. [name] has indicated that he wanted to continue with the procedure. Attestation: I, the ordering provider, attest that I have discussed with the patient the benefits, risks, side-effects, alternatives, likelihood of achieving goals, and potential problems during recovery for the procedure that I have provided informed consent. Date  Time: 12/15/2020 11:00 AM  Pre-Procedure Preparation:  Monitoring: As per clinic protocol. Respiration, ETCO2, SpO2, BP, heart rate and rhythm monitor placed and checked for adequate function Safety Precautions: Patient was assessed for positional comfort and pressure points before starting the procedure. Time-out: I initiated and conducted the "Time-out" before starting the procedure, as per protocol. The patient was asked to participate by confirming the accuracy of the "Time Out" information. Verification of the correct person, site, and procedure were performed and confirmed by me, the nursing staff, and the patient. "Time-out" conducted as per Joint Commission's Universal Protocol (UP.01.01.01). Time: 1142  Description of Procedure:          Laterality: Bilateral. The procedure was performed in identical fashion on both sides. Levels:  L3, L4, L5,  Medial Branch Level(s) Area Prepped: Posterior Lumbosacral Region DuraPrep (Iodine Povacrylex [0.7% available iodine] and Isopropyl Alcohol, 74% w/w) Safety Precautions: Aspiration looking for blood return was conducted prior to all injections. At no point did we inject any substances, as a needle was being advanced. Before injecting, the patient was told to immediately notify me if he was experiencing any new onset of "ringing in the ears, or metallic taste in the mouth". No attempts were made at seeking any paresthesias. Safe injection practices and needle disposal techniques used. Medications properly checked for  expiration dates. SDV (single dose vial) medications used. After the completion of the procedure, all disposable equipment used was discarded in the proper designated medical waste containers. Local Anesthesia: Protocol guidelines were followed. The patient was positioned over the fluoroscopy table. The area was prepped in the usual manner. The time-out was completed. The  target area was identified using fluoroscopy. A 12-in long, straight, sterile hemostat was used with fluoroscopic guidance to locate the targets for each level blocked. Once located, the skin was marked with an approved surgical skin marker. Once all sites were marked, the skin (epidermis, dermis, and hypodermis), as well as deeper tissues (fat, connective tissue and muscle) were infiltrated with a small amount of a short-acting local anesthetic, loaded on a 10cc syringe with a 25G, 1.5-in  Needle. An appropriate amount of time was allowed for local anesthetics to take effect before proceeding to the next step. Local Anesthetic: Lidocaine 2.0% The unused portion of the local anesthetic was discarded in the proper designated containers. Technical explanation of process:   L3 Medial Branch Nerve Block (MBB): The target area for the L3 medial branch is at the junction of the postero-lateral aspect of the superior articular process and the superior, posterior, and medial edge of the transverse process of L4. Under fluoroscopic guidance, a Quincke needle was inserted until contact was made with os over the superior postero-lateral aspect of the pedicular shadow (target area). After negative aspiration for blood, 86mL of the nerve block solution was injected without difficulty or complication. The needle was removed intact. L4 Medial Branch Nerve Block (MBB): The target area for the L4 medial branch is at the junction of the postero-lateral aspect of the superior articular process and the superior, posterior, and medial edge of the transverse  process of L5. Under fluoroscopic guidance, a Quincke needle was inserted until contact was made with os over the superior postero-lateral aspect of the pedicular shadow (target area). After negative aspiration for blood, 54mL of the nerve block solution was injected without difficulty or complication. The needle was removed intact. L5 Medial Branch Nerve Block (MBB): The target area for the L5 medial branch is at the junction of the postero-lateral aspect of the superior articular process and the superior, posterior, and medial edge of the sacral ala. Under fluoroscopic guidance, a Quincke needle was inserted until contact was made with os over the superior postero-lateral aspect of the pedicular shadow (target area). After negative aspiration for blood, 37mL of the nerve block solution was injected without difficulty or complication. The needle was removed intact.  Nerve block solution: 12 cc solution made of 9 cc of 0.2% ropivacaine, 3 cc of Decadron 10 mg/cc.  2 cc injected each level above bilaterally.    Procedural Needles: 22-gauge, 5-inch, Quincke needles used for all levels.  Once the entire procedure was completed, the treated area was cleaned, making sure to leave some of the prepping solution back to take advantage of its long term bactericidal properties.      Illustration of the posterior view of the lumbar spine and the posterior neural structures. Laminae of L2 through S1 are labeled. DPRL5, dorsal primary ramus of L5; DPRS1, dorsal primary ramus of S1; DPR3, dorsal primary ramus of L3; FJ, facet (zygapophyseal) joint L3-L4; I, inferior articular process of L4; LB1, lateral branch of dorsal primary ramus of L1; IAB, inferior articular branches from L3 medial branch (supplies L4-L5 facet joint); IBP, intermediate branch plexus; MB3, medial branch of dorsal primary ramus of L3; NR3, third lumbar nerve root; S, superior articular process of L5; SAB, superior articular branches from L4  (supplies L4-5 facet joint also); TP3, transverse process of L3.  Vitals:   12/15/20 1143 12/15/20 1147 12/15/20 1152 12/15/20 1157  BP: 140/80 136/83 130/72 136/88  Pulse:      Resp: 11 13  13 18  Temp:      TempSrc:      SpO2: 95% 96% 95% 95%  Weight:      Height:         Start Time: 1142 hrs. End Time: 1155 hrs.  Imaging Guidance (Spinal):          Type of Imaging Technique: Fluoroscopy Guidance (Spinal) Indication(s): Assistance in needle guidance and placement for procedures requiring needle placement in or near specific anatomical locations not easily accessible without such assistance. Exposure Time: Please see nurses notes. Contrast: None used. Fluoroscopic Guidance: I was personally present during the use of fluoroscopy. "Tunnel Vision Technique" used to obtain the best possible view of the target area. Parallax error corrected before commencing the procedure. "Direction-depth-direction" technique used to introduce the needle under continuous pulsed fluoroscopy. Once target was reached, antero-posterior, oblique, and lateral fluoroscopic projection used confirm needle placement in all planes. Images permanently stored in EMR. Interpretation: No contrast injected. I personally interpreted the imaging intraoperatively. Adequate needle placement confirmed in multiple planes. Permanent images saved into the patient's record.   Post-operative Assessment:  Post-procedure Vital Signs:  Pulse/HCG Rate: (!) 50(!) 50 Temp: (!) 97.1 F (36.2 C) Resp: 18 BP: 136/88 SpO2: 95 %  EBL: None  Complications: No immediate post-treatment complications observed by team, or reported by patient.  Note: The patient tolerated the entire procedure well. A repeat set of vitals were taken after the procedure and the patient was kept under observation following institutional policy, for this type of procedure. Post-procedural neurological assessment was performed, showing return to baseline, prior  to discharge. The patient was provided with post-procedure discharge instructions, including a section on how to identify potential problems. Should any problems arise concerning this procedure, the patient was given instructions to immediately contact us, at any time, without hesitation. In any case, we plan to contact the patient by telephone for a follow-up status report regarding this interventional procedure.  Comments:  No additional relevant information.  Plan of Care  Patient instructed to restart Xarelto tomorrow so long as he is not having any lower extremity weakness. Consider RFA next   Orders:  Orders Placed This Encounter  Procedures   DG PAIN [facility] C-ARM 1-60 MIN NO REPORT    Intraoperative interpretation by procedural physician at [place].    Standing Status:   Standing    Number of Occurrences:   1    Order Specific Question:   Reason for exam:    Answer:   Assistance in needle guidance and placement for procedures requiring needle placement in or near specific anatomical locations not easily accessible without such assistance.    Chronic Opioid Analgesic:  Tramadol 50 mg 4 times daily as needed, quantity 120/month; MME equals 20    Medications ordered for procedure: Meds ordered this encounter  Medications   iohexol (OMNIPAQUE) 180 MG/ML injection 10 mL    Must be Myelogram-compatible. If not available, you may substitute with a water-soluble, non-ionic, hypoallergenic, myelogram-compatible radiological contrast medium.   lidocaine (XYLOCAINE) 2 % (with pres) injection 400 mg   dexamethasone (DECADRON) injection 10 mg   dexamethasone (DECADRON) injection 10 mg   dexamethasone (DECADRON) injection 10 mg   ropivacaine (PF) 2 mg/mL (0.2%) (NAROPIN) injection 9 mL   ropivacaine (PF) 2 mg/mL (0.2%) (NAROPIN) injection 9 mL    Medications administered: We administered lidocaine, dexamethasone, dexamethasone, dexamethasone, ropivacaine (PF) 2 mg/mL  (0.2%), and ropivacaine (PF) 2 mg/mL (0.2%).  See the medical record  for exact dosing, route, and time of administration.  Follow-up plan:   Return in about 3 weeks (around 01/05/2021) for Post Procedure Evaluation, virtual.     Recent Visits Date Type Provider Dept  12/01/20 Procedure visit Wayne Santa, MD [facility]  11/15/20 Office Visit Wayne Santa, MD [facility]  10/04/20 Procedure visit Wayne Santa, MD [facility]  09/27/20 Telemedicine Wayne Santa, MD [facility]  Showing recent visits within past 90 days and meeting all other requirements Today's Visits Date Type Provider Dept  12/15/20 Procedure visit Wayne Santa, MD [facility]  Showing today's visits and meeting all other requirements Future Appointments Date Type Provider Dept  01/05/21 Appointment Wayne Santa, MD [facility]  02/01/21 Appointment Wayne Santa, MD [facility]  Showing future appointments within next 90 days and meeting all other requirements Disposition: Discharge home  Discharge (Date  Time): 12/15/2020; 1202 hrs.   Primary Care Physician: Wayne Hauser, DO Location: [facility] Note by: Wayne Santa, MD Date: 12/15/2020; Time: 2:40 PM  Disclaimer:  Medicine is not an exact science. The only guarantee in medicine is that nothing is guaranteed. It is important to note that the decision to proceed with this intervention was based on the information collected from the patient. The Data and conclusions were drawn from the patient's questionnaire, the interview, and the physical examination. Because the information was provided in large part by the patient, it cannot be guaranteed that it has not been purposely or unconsciously manipulated. Every effort has been made to obtain as much relevant data as possible for this evaluation. It is important to note that the conclusions that lead to  this procedure are derived in large part from the available data. Always take into account that the treatment will also be dependent on availability of resources and existing treatment guidelines, considered by other Pain Management Practitioners as being common knowledge and practice, at the time of the intervention. For Medico-Legal purposes, it is also important to point out that variation in procedural techniques and pharmacological choices are the acceptable norm. The indications, contraindications, technique, and results of the above procedure should only be interpreted and judged by a Board-Certified Interventional Pain Specialist with extensive familiarity and expertise in the same exact procedure and technique.

## 2020-12-15 NOTE — Patient Instructions (Signed)

## 2020-12-15 NOTE — Progress Notes (Signed)
Safety precautions to be maintained throughout the outpatient stay will include: orient to surroundings, keep bed in low position, maintain call bell within reach at all times, provide assistance with transfer out of bed and ambulation.  

## 2020-12-16 ENCOUNTER — Telehealth: Payer: Self-pay | Admitting: *Deleted

## 2020-12-16 NOTE — Telephone Encounter (Signed)
Post procedure call:   no  questions or concerns.  

## 2020-12-23 NOTE — Telephone Encounter (Signed)
To close

## 2021-01-03 ENCOUNTER — Ambulatory Visit: Payer: Self-pay

## 2021-01-03 ENCOUNTER — Telehealth: Payer: Self-pay | Admitting: Cardiovascular Disease

## 2021-01-03 NOTE — Telephone Encounter (Signed)
°  Chief Complaint: right ear bloody drainage , decreased hearing Symptoms: runny nose Frequency: intermittent Pertinent Negatives: Patient denies headache, dizziness Disposition: [] ED /[x] Urgent Care (no appt availability in office) / [] Appointment(In office/virtual)/ []  [facility]/ [] Home Care/ [] Refused Recommended Disposition  Additional Notes: Pt refused UC- no openings niter within 24 hours- refused UC- would like appt with PCP but willing to see Rollene Fare if within 24 hours. Agent made appt for this Thursday. Please call pt if no availability in the next 24 hours        Reason for Disposition  Unexplained bleeding from ear  Answer Assessment - Initial Assessment Questions 1. LOCATION: "Which ear is involved?"      right 2. COLOR: "What is the color of the discharge?"      bloody 3. CONSISTENCY: "How runny is the discharge? Could it be water?"      thin 4. ONSET: "When did you first notice the discharge?"     Sunday 5. PAIN: "Is there any earache?" "How bad is it?"  (Scale 1-10; or mild, moderate, severe)    Not sure  6. OBJECTS: "Have you put anything in your ear?" (e.g., Q-tip, other object)      Q tip 7. OTHER SYMPTOMS: "Do you have any other symptoms?" (e.g., headache, fever, dizziness, vomiting, runny nose)     Runny nose, decreased hearing 8. PREGNANCY: "Is there any chance you are pregnant?" "When was your last menstrual period?"     *No Answer*  Protocols used: Ear - [place]

## 2021-01-03 NOTE — Telephone Encounter (Signed)
°*  STAT* If patient is at the pharmacy, call can be transferred to refill team.   1. Which medications need to be refilled? (please list name of each medication and dose if known) rosuvastatin 10 MG 1 tablet at bedtime  2. Which pharmacy/location (including street and city if local pharmacy) is medication to be sent to? [facility] in   3. Do they need a 30 day or 90 day supply? 90 day

## 2021-01-05 ENCOUNTER — Encounter: Payer: Self-pay | Admitting: Student in an Organized Health Care Education/Training Program

## 2021-01-05 ENCOUNTER — Ambulatory Visit
Payer: Medicare Other | Attending: Student in an Organized Health Care Education/Training Program | Admitting: Student in an Organized Health Care Education/Training Program

## 2021-01-05 DIAGNOSIS — M47816 Spondylosis without myelopathy or radiculopathy, lumbar region: Secondary | ICD-10-CM | POA: Diagnosis not present

## 2021-01-05 DIAGNOSIS — M6530 Trigger finger, unspecified finger: Secondary | ICD-10-CM

## 2021-01-05 DIAGNOSIS — G894 Chronic pain syndrome: Secondary | ICD-10-CM | POA: Diagnosis not present

## 2021-01-05 NOTE — Progress Notes (Signed)
Patient: [name]  Service Category: E/M  Provider: Gillis Santa, MD  DOB: [birth year]  DOS: 01/05/2021  Location: Office  MRN: [id number]  Setting: Ambulatory outpatient  Referring Provider: Nobie Hill *  Type: Established Patient  Specialty: Interventional Pain Management  PCP: Wayne Hauser, DO  Location: Remote location  Delivery: TeleHealth     Virtual Encounter - Pain Management PROVIDER NOTE: Information contained herein reflects review and annotations entered in association with encounter. Interpretation of such information and data should be left to medically-trained personnel. Information provided to patient can be located elsewhere in the medical record under "Patient Instructions". Document created using STT-dictation technology, any transcriptional errors that may result from process are unintentional.    Contact & Pharmacy Preferred: [phone] Home: [phone] (home) Mobile: [phone] (mobile) E-mail: [email or web address]  [id number] [place] [place] [address]Phone: [phone]: [phone] [id number]Phone: [phone]: [phone] [facility] [address] [address] [facility] ClinicNA[place]aska[address]Phone: [phone]: [phone]  Pre-screening  [name] offered "in-person" vs "virtual" encounter. He indicated preferring virtual for this encounter.   Reason COVID-19*   Social distancing based on CDC and AMA recommendations.   I contacted Wayne Hansenon 01/05/2021 via telephone.      I clearly identified myself as BGillis Santa MD. I verified that I was speaking with the correct person using two identifiers (Name: [name] and date of birth: [birth year].  Consent I sought verbal advanced  consent from Wayne Hansenfor virtual visit interactions. I informed Mr. FColaizziof possible security and privacy concerns, risks, and limitations associated with providing "not-in-person" medical evaluation and management services. I also informed Mr. FDicioccioof the availability of "in-person" appointments. Finally, I informed him that there would be a charge for the virtual visit and that he could be  personally, fully or partially, financially responsible for it. Mr. FBuckleexpressed understanding and agreed to proceed.   Historic Elements   Mr. RLIANDRO THELINis a 65y.o. year old, male patient evaluated today after our last contact on 12/15/2020. [name] has a past medical history of Cardiomyopathy (in setting of Afib), DJD (degenerative joint disease) of knee, History of kidney stones, Intervertebral disc disorder with radiculopathy of lumbosacral region, Kidney stone (2012), Lower extremity edema, PAF (paroxysmal atrial fibrillation) ([place], Pernicious anemia, Prostate cancer ([place], Pulmonary nodule, right, and SVT (supraventricular tachycardia) ([place]. He also  has a past surgical history that includes Knee surgery; Gastric bypass (2002); prostate seeding; Carpal tunnel release (Right, 12/23/2014); Carpal tunnel release (Left, 01/06/2015); Colonoscopy with propofol (N/A, 10/08/2017); and Cardioversion (N/A, 05/15/2019). Mr. FLivingstonehas a current medication list which includes the following prescription(s): acetaminophen, amiodarone, benazepril, cyanocobalamin, cyclobenzaprine, ezetimibe, fluticasone, furosemide, gabapentin, ibuprofen, metoprolol succinate, multivitamin with minerals, rosuvastatin, tramadol, trazodone, rivaroxaban, and shingrix. He  reports that he quit smoking about 41 years ago. His smoking use included cigarettes. He has a 10.00 pack-year smoking history. He has quit using smokeless tobacco.  His smokeless tobacco use included snuff. He reports current alcohol use of about 12.0 standard  drinks per week. He reports that he does not use drugs. [name] No Known Allergies.   HPI  Today, he is being contacted for a post-procedure assessment.   Post-procedure evaluation   Effectiveness:  Initial hour after procedure: 100 %  Subsequent 4-6 hours post-procedure: 100 %  Analgesia past initial 6 hours: 100 % (last wednesday the pain returned and he had a lot of difficulty.  pain is better now and pain meds are working.)  Ongoing improvement:  Analgesic:  50%-60% Function: [name] reports improvement in function   Pharmacotherapy Assessment   Opioid Analgesic: Tramadol 50 mg 4 times daily as needed, quantity 120/month; MME equals 20    Monitoring: [place] PMP: PDMP reviewed during this encounter.       Pharmacotherapy: No side-effects or adverse reactions reported. Compliance: No problems identified. Effectiveness: Clinically acceptable. Plan: Refer to "POC". UDS:  Summary  Date Value Ref Range Status  04/06/2020 Note  Final    Comment:    ==================================================================== Tox[employer] Select 13 (MW) ==================================================================== Test                             Result       Flag       Units  Drug Present and Declared for Prescription Verification   Tramadol                       >4386        EXPECTED   ng/mg creat   O-Desmethyltramadol            3353         EXPECTED   ng/mg creat   N-Desmethyltramadol            >4386        EXPECTED   ng/mg creat    Source of tramadol is a prescription medication. O-desmethyltramadol    and N-desmethyltramadol are expected metabolites of tramadol.  ==================================================================== Test                      Result    Flag   Units      Ref Range   Creatinine              114              mg/dL      >=20 ==================================================================== Declared Medications:  The flagging and  interpretation on this report are based on the  following declared medications.  Unexpected results may arise from  inaccuracies in the declared medications.   **Note: The testing scope of this panel includes these medications:   Tramadol   **Note: The testing scope of this panel does not include the  following reported medications:   Acetaminophen (Tylenol)  Amiodarone  Amlodipine  Benazepril  Cyclobenzaprine (Flexeril)  Ezetimibe  Fluticasone  Furosemide  Gabapentin  Menthol  Methyl Salicylate (topical)  Metoprolol  Mirtazapine  Multivitamin  Rivaroxaban  Rosuvastatin  Trazodone  Vitamin B12 ==================================================================== For clinical consultation, please call [phone]. ====================================================================      Laboratory Chemistry Profile   Renal Lab Results  Component Value Date   BUN 17 07/21/2020   CREATININE 1.21 09/81/1914   BCR NOT APPLICABLE 78/29/5621   GFRAA 72 07/21/2020   GFRNONAA 62 07/21/2020    Hepatic Lab Results  Component Value Date   AST 15 07/21/2020   ALT 11 07/21/2020   ALBUMIN 4.6 08/01/2019   ALKPHOS 72 08/01/2019   LIPASE 28 03/13/2017    Electrolytes Lab Results  Component  Value Date   NA 143 07/21/2020   K 4.2 07/21/2020   CL 108 07/21/2020   CALCIUM 9.0 07/21/2020   MG 2.0 05/05/2019    Bone No results found for: VD25OH, VD125OH2TOT, HO8875ZV7, KQ2060RV6, 25OHVITD1, 25OHVITD2, 25OHVITD3, TESTOFREE, TESTOSTERONE  Inflammation (CRP: Acute Phase) (ESR: Chronic Phase) No results found for: CRP, ESRSEDRATE, LATICACIDVEN       Note: Above Lab results reviewed.   Assessment  The primary encounter diagnosis was Lumbar facet joint syndrome. Diagnoses of Lumbar facet arthropathy, Chronic pain syndrome, and Trigger finger of right hand, unspecified finger were also pertinent to this visit.  Plan of Care  Will continue to monitor sx in regards to low  back pain Having issues with his right trigger finger again and would like to have that injected. We can do that at his medication management visit.  Orders Placed This Encounter  Procedures   Injection tendon or ligament    Standing Status:   Future    Standing Expiration Date:   04/05/2021    Scheduling Instructions:     Trigger finger injection   Follow-up plan:   Return in about 27 days (around 02/01/2021) for Trigger finger (in room) + MM.     s/p lesi #1 (left L4/5) on 8/17, #2 on 10/21/2018 (left L3/4), #3 on 12/09/2018 (Left L3/4); 02/24/2019: Right wrist injection and left trigger finger (middle finger) injection.,  Bilateral wrist injection 10/15/2019, bilateral knee Hyalgan No. 1 01/26/1960. #2 02/23/2020, #3 03/24/2020.  Endorsing approximately 75 to 80% pain relief for bilateral knee pain.        Recent Visits Date Type Provider Dept  12/15/20 Procedure visit Gillis Santa, MD [facility]  12/01/20 Procedure visit Gillis Santa, MD [facility]  11/15/20 Office Visit Gillis Santa, MD [facility]  Showing recent visits within past 90 days and meeting all other requirements Today's Visits Date Type Provider Dept  01/05/21 Office Visit Gillis Santa, MD [facility]  Showing today's visits and meeting all other requirements Future Appointments Date Type Provider Dept  02/01/21 Appointment Gillis Santa, MD [facility]  Showing future appointments within next 90 days and meeting all other requirements  I discussed the assessment and treatment plan with the patient. The patient was provided an opportunity to ask questions and all were answered. The patient agreed with the plan and demonstrated an understanding of the instructions.  Patient advised to call back or seek an in-person evaluation if the symptoms or condition worsens.  Duration of encounter: 77mnutes.  Note by: BGillis Santa MD Date: 01/05/2021; Time: 3:09 PM

## 2021-01-06 ENCOUNTER — Encounter: Payer: Self-pay | Admitting: Family Medicine

## 2021-01-06 ENCOUNTER — Other Ambulatory Visit: Payer: Self-pay

## 2021-01-06 ENCOUNTER — Ambulatory Visit (INDEPENDENT_AMBULATORY_CARE_PROVIDER_SITE_OTHER): Payer: Medicare Other | Admitting: Family Medicine

## 2021-01-06 VITALS — BP 154/77 | HR 54 | Ht 72.0 in | Wt 346.8 lb

## 2021-01-06 DIAGNOSIS — H9201 Otalgia, right ear: Secondary | ICD-10-CM

## 2021-01-06 NOTE — Patient Instructions (Addendum)
Thank you for coming to the office today.  Small scab at back of ear canal next to ear drum. It appears old blood, nothing new. No issue really with ear drum itself. Ear canal is great. Likely whatever caused the bleed - possible small tiny hole in ear drum has now healed and scabbed up. It should resolve.  If new sudden pain bleed or loss of hearing - check in with [facility]  Please schedule a Follow-up Appointment to: Return if symptoms worsen or fail to improve.  If you have any other questions or concerns, please feel free to call the office or send a message through [place]. You may also schedule an earlier appointment if necessary.  Additionally, you may be receiving a survey about your experience at our office within a few days to 1 week by e-mail or mail. We value your feedback.  Nobie Putnam, DO [place]

## 2021-01-06 NOTE — Progress Notes (Signed)
Subjective:    Patient ID: [name], male    DOB: Sep 24, 1955, 65 y.o.   MRN: [id number]  [name] is a 65 y.o. male presenting on 01/06/2021 for ear pressure   HPI  RIGHT Ear Pain / Pressure / Bleeding History of URI Sinus Illness Recent URI illness 3-4 weeks ago, congestion, cough. A lot of pressure build up in sinus and ears as well. Seemed to improve lately. Identified some blood coming out of R ear this past weekend. No further blood out of ear. Uses medicated ear drops for tinnitus Has used Qtips to clean ears, but did not injure it. Known hearing loss down to about 75%, has had CT in past and followed by [facility], dx with tinnitus, has hearing aids.   Depression screen [facility] 2/9 12/01/2020 10/04/2020 08/16/2020  Decreased Interest 0 0 0  Down, Depressed, Hopeless 0 0 0  PHQ - 2 Score 0 0 0  Altered sleeping - - -  Tired, decreased energy - - -  Change in appetite - - -  Feeling bad or failure about yourself  - - -  Trouble concentrating - - -  Moving slowly or fidgety/restless - - -  Suicidal thoughts - - -  PHQ-9 Score - - -    Social History   Tobacco Use   Smoking status: Former    Packs/day: 1.00    Years: 10.00    Pack years: 10.00    Types: Cigarettes    Quit date: 02/18/1979    Years since quitting: 41.9   Smokeless tobacco: Former    Types: Snuff  Vaping Use   Vaping Use: Never used  Substance Use Topics   Alcohol use: Yes    Alcohol/week: 12.0 standard drinks    Types: 12 Cans of beer per week    Comment: weekly   Drug use: No    Review of Systems Per HPI unless specifically indicated above     Objective:    BP (!) 154/77    Pulse (!) 54    Ht 6' (1.829 m)    Wt (!) 346 lb 12.8 oz (157.3 kg)    SpO2 95%    BMI 47.03 kg/m   Wt Readings from Last 3 Encounters:  01/06/21 (!) 346 lb 12.8 oz (157.3 kg)  12/15/20 (!) 355 lb (161 kg)  12/01/20 (!) 350 lb (158.8 kg)    Physical Exam Vitals and nursing note reviewed.  Constitutional:       General: He is not in acute distress.    Appearance: Normal appearance. He is well-developed. He is not diaphoretic.     Comments: Well-appearing, comfortable, cooperative  HENT:     Head: Normocephalic and atraumatic.     Right Ear: Tympanic membrane, ear canal and external ear normal. There is no impacted cerumen.     Left Ear: Tympanic membrane, ear canal and external ear normal. There is no impacted cerumen.     Ears:     Comments: Right Ear Ext Canal - there is a small about 1 cm old dried blood scab, in front of bottom center of ear drum, does not necessarily appear to be attached. No sign of ext ear canal swelling or infection or laceration. Ear drum appears mostly normal, slight clear fluid effusion. Eyes:     General:        Right eye: No discharge.        Left eye: No discharge.  Conjunctiva/sclera: Conjunctivae normal.  Cardiovascular:     Rate and Rhythm: Normal rate.  Pulmonary:     Effort: Pulmonary effort is normal.  Skin:    General: Skin is warm and dry.     Findings: No erythema or rash.  Neurological:     Mental Status: He is alert and oriented to person, place, and time.  Psychiatric:        Mood and Affect: Mood normal.        Behavior: Behavior normal.        Thought Content: Thought content normal.     Comments: Well groomed, good eye contact, normal speech and thoughts   Results for orders placed or performed in visit on 07/20/20  TSH  Result Value Ref Range   TSH 2.38 0.40 - 4.50 mIU/L  PSA  Result Value Ref Range   PSA 0.04 < OR = 4.00 ng/mL  Lipid panel  Result Value Ref Range   Cholesterol 137 <200 mg/dL   HDL 66 > OR = 40 mg/dL   Triglycerides 90 <150 mg/dL   LDL Cholesterol (Calc) 54 mg/dL (calc)   Total CHOL/HDL Ratio 2.1 <5.0 (calc)   Non-HDL Cholesterol (Calc) 71 <130 mg/dL (calc)  COMPLETE METABOLIC PANEL WITH GFR  Result Value Ref Range   Glucose, Bld 102 (H) 65 - 99 mg/dL   BUN 17 7 - 25 mg/dL   Creat 1.21 0.70 - 1.25 mg/dL    GFR, Est Non African American 62 > OR = 60 mL/min/1.37m2   GFR, Est African American 72 > OR = 60 mL/min/1.59m2   BUN/Creatinine Ratio NOT APPLICABLE 6 - 22 (calc)   Sodium 143 135 - 146 mmol/L   Potassium 4.2 3.5 - 5.3 mmol/L   Chloride 108 98 - 110 mmol/L   CO2 27 20 - 32 mmol/L   Calcium 9.0 8.6 - 10.3 mg/dL   Total Protein 6.3 6.1 - 8.1 g/dL   Albumin 4.4 3.6 - 5.1 g/dL   Globulin 1.9 1.9 - 3.7 g/dL (calc)   AG Ratio 2.3 1.0 - 2.5 (calc)   Total Bilirubin 0.4 0.2 - 1.2 mg/dL   Alkaline phosphatase (APISO) 62 35 - 144 U/L   AST 15 10 - 35 U/L   ALT 11 9 - 46 U/L  CBC with Differential/Platelet  Result Value Ref Range   WBC 5.4 3.8 - 10.8 Thousand/uL   RBC 5.05 4.20 - 5.80 Million/uL   Hemoglobin 12.8 (L) 13.2 - 17.1 g/dL   HCT 41.8 38.5 - 50.0 %   MCV 82.8 80.0 - 100.0 fL   MCH 25.3 (L) 27.0 - 33.0 pg   MCHC 30.6 (L) 32.0 - 36.0 g/dL   RDW 14.9 11.0 - 15.0 %   Platelets 192 140 - 400 Thousand/uL   MPV 9.9 7.5 - 12.5 fL   Neutro Abs 2,938 1,500 - 7,800 cells/uL   Lymphs Abs 1,787 850 - 3,900 cells/uL   Absolute Monocytes 594 200 - 950 cells/uL   Eosinophils Absolute 49 15 - 500 cells/uL   Basophils Absolute 32 0 - 200 cells/uL   Neutrophils Relative % 54.4 %   Total Lymphocyte 33.1 %   Monocytes Relative 11.0 %   Eosinophils Relative 0.9 %   Basophils Relative 0.6 %  Hemoglobin A1c  Result Value Ref Range   Hgb A1c MFr Bld 5.7 (H) <5.7 % of total Hgb   Mean Plasma Glucose 117 mg/dL   eAG (mmol/L) 6.5 mmol/L  Assessment & Plan:   Problem List Items Addressed This Visit   None Visit Diagnoses     Right ear pain    -  Primary       Resolving  Small scab at back of ear canal next to ear drum. It appears old blood, nothing new. No issue really with ear drum itself. Ear canal is great. Likely whatever caused the bleed - possible small tiny hole in ear drum has now healed and scabbed up. It should resolve.  Avoid Q-tips and Ear drops for now.  If new  sudden pain bleed or loss of hearing - check in with [facility]  No orders of the defined types were placed in this encounter.     Follow up plan: Return if symptoms worsen or fail to improve.  Nobie Putnam, [facility] 01/06/2021, 10:57 AM

## 2021-01-16 ENCOUNTER — Other Ambulatory Visit: Payer: Self-pay | Admitting: Family Medicine

## 2021-01-16 DIAGNOSIS — M19031 Primary osteoarthritis, right wrist: Secondary | ICD-10-CM

## 2021-01-16 DIAGNOSIS — M19032 Primary osteoarthritis, left wrist: Secondary | ICD-10-CM

## 2021-01-18 NOTE — Telephone Encounter (Signed)
Requested medication (s) are due for refill today: yes  Requested medication (s) are on the active medication list: yes  Last refill:  11/16/20 #90 with 1 refill  Future visit scheduled: yes  Notes to clinic:  Please review for refill. Refill not delegated per protocol    Requested Prescriptions  Pending Prescriptions Disp Refills   cyclobenzaprine (FLEXERIL) 10 MG tablet [Pharmacy Med Name: CYCLOBENZAPRINE 10 MG TAB] 90 tablet 0    Sig: TAKE ONE TABLET BY MOUTH THREE TIMES A DAY AS NEEDED FOR MUSCLE SPASM     Not Delegated - Analgesics:  Muscle Relaxants Failed - 01/16/2021  2:21 PM      Failed - This refill cannot be delegated      Passed - Valid encounter within last 6 months    Recent Outpatient Visits           1 week ago Right ear pain   [place], DO   5 months ago Morbid obesity with BMI of 50.0-59.9, adult [facility])   [place], DO   10 months ago Acute dysfunction of right eustachian tube   [place], DO   1 year ago Insomnia, unspecified type   [facility] Kathrine Haddock, NP   1 year ago Essential hypertension   [facility] Volney American, [place]       Future Appointments             In 6 months Parks Ranger, Devonne Doughty, DO [facility]

## 2021-01-19 DIAGNOSIS — H90A21 Sensorineural hearing loss, unilateral, right ear, with restricted hearing on the contralateral side: Secondary | ICD-10-CM | POA: Diagnosis not present

## 2021-01-20 ENCOUNTER — Telehealth: Payer: Self-pay | Admitting: Family Medicine

## 2021-01-20 NOTE — Telephone Encounter (Signed)
Latoya calling from [facility] regarding PA for cyclobenzaprine (FLEXERIL) 10 MG tablet [[id number]]   Latoya did intiate the PA  Cb- [id number] Reference Number [id number]

## 2021-01-24 ENCOUNTER — Telehealth: Payer: Self-pay | Admitting: Family Medicine

## 2021-01-24 NOTE — Telephone Encounter (Signed)
Tasha from [facility] call in abut prior auth forcyclobenzaprine (FLEXERIL) 10 MG tablet . She wanted to discuss clinical notes. Case # is [id number]

## 2021-01-25 ENCOUNTER — Other Ambulatory Visit: Payer: Self-pay | Admitting: Family Medicine

## 2021-01-25 DIAGNOSIS — H6981 Other specified disorders of Eustachian tube, right ear: Secondary | ICD-10-CM

## 2021-01-25 NOTE — Telephone Encounter (Signed)
Change in pharmacy- Rx sent Requested Prescriptions  Pending Prescriptions Disp Refills   fluticasone (FLONASE) 50 MCG/ACT nasal spray [Pharmacy Med Name: FLUTICASONE 50 MCG SPRAY] 16 mL 0    Sig: USE TWO SPRAYS IN THE AFFECTED NOSTRIL ONE TIME DAILY     Ear, Nose, and Throat: Nasal Preparations - Corticosteroids Passed - 01/25/2021  1:29 PM      Passed - Valid encounter within last 12 months    Recent Outpatient Visits          2 weeks ago Right ear pain   [place], DO   6 months ago Morbid obesity with BMI of 50.0-59.9, adult [facility])   [place], DO   11 months ago Acute dysfunction of right eustachian tube   [place], DO   1 year ago Insomnia, unspecified type   [facility] Kathrine Haddock, NP   1 year ago Essential hypertension   [facility] Volney American, [place]      Future Appointments            In 6 months Parks Ranger, Devonne Doughty, DO [facility]

## 2021-02-01 ENCOUNTER — Ambulatory Visit
Payer: Medicare Other | Attending: Student in an Organized Health Care Education/Training Program | Admitting: Student in an Organized Health Care Education/Training Program

## 2021-02-01 ENCOUNTER — Other Ambulatory Visit: Payer: Self-pay

## 2021-02-01 ENCOUNTER — Encounter: Payer: Self-pay | Admitting: Student in an Organized Health Care Education/Training Program

## 2021-02-01 VITALS — BP 145/83 | HR 54 | Temp 97.4°F | Resp 16 | Ht 72.0 in | Wt 346.0 lb

## 2021-02-01 DIAGNOSIS — G894 Chronic pain syndrome: Secondary | ICD-10-CM | POA: Diagnosis not present

## 2021-02-01 DIAGNOSIS — M5416 Radiculopathy, lumbar region: Secondary | ICD-10-CM | POA: Diagnosis not present

## 2021-02-01 DIAGNOSIS — G8929 Other chronic pain: Secondary | ICD-10-CM | POA: Diagnosis not present

## 2021-02-01 DIAGNOSIS — M25562 Pain in left knee: Secondary | ICD-10-CM | POA: Insufficient documentation

## 2021-02-01 DIAGNOSIS — M6530 Trigger finger, unspecified finger: Secondary | ICD-10-CM | POA: Insufficient documentation

## 2021-02-01 DIAGNOSIS — M47816 Spondylosis without myelopathy or radiculopathy, lumbar region: Secondary | ICD-10-CM | POA: Diagnosis not present

## 2021-02-01 DIAGNOSIS — M25561 Pain in right knee: Secondary | ICD-10-CM | POA: Diagnosis not present

## 2021-02-01 DIAGNOSIS — M170 Bilateral primary osteoarthritis of knee: Secondary | ICD-10-CM | POA: Diagnosis not present

## 2021-02-01 MED ORDER — HYDROCODONE-ACETAMINOPHEN 7.5-325 MG PO TABS: 1.0000 | ORAL_TABLET | Freq: Three times a day (TID) | ORAL | 0 refills | Status: AC | PRN

## 2021-02-01 MED ORDER — DEXAMETHASONE SODIUM PHOSPHATE 10 MG/ML IJ SOLN
10.0000 mg | Freq: Once | INTRAMUSCULAR | Status: AC
  Administered 2021-02-01: 10 mg

## 2021-02-01 MED ORDER — LIDOCAINE HCL 2 % IJ SOLN
INTRAMUSCULAR | Status: AC
  Filled 2021-02-01: qty 20

## 2021-02-01 MED ORDER — LIDOCAINE HCL 2 % IJ SOLN
20.0000 mL | Freq: Once | INTRAMUSCULAR | Status: AC
  Administered 2021-02-01: 100 mg

## 2021-02-01 MED ORDER — DEXAMETHASONE SODIUM PHOSPHATE 10 MG/ML IJ SOLN
INTRAMUSCULAR | Status: AC
  Filled 2021-02-01: qty 1

## 2021-02-01 NOTE — Progress Notes (Signed)
PROVIDER NOTE: Interpretation of information contained herein should be left to medically-trained personnel. Specific patient instructions are provided elsewhere under "Patient Instructions" section of medical record. This document was created in part using STT-dictation technology, any transcriptional errors that may result from this process are unintentional.  Patient: [name] Type: Established DOB: 1955-10-13 MRN: [id number] PCP: Olin Hauser, DO  Service: Procedure DOS: 02/01/2021 Setting: Ambulatory Location: Ambulatory outpatient facility Delivery: Face-to-face Provider: Gillis Santa, MD Specialty: Interventional Pain Management Specialty designation: 09 Location: Outpatient facility Ref. Prov.: Nobie Putnam *    Primary Reason for Visit: Interventional Pain Management Treatment. CC: Finger Injury (Right, middle)    Procedure:          Anesthesia, Analgesia, Anxiolysis:  Type: Trigger Finger Ligament/Tendon sheath (20550) Injection.  #1  Purpose: Diagnostic Target Area: Flexor Digitorum Tendon sheath nodule Region: A-1(proximal) pulley of the metacarpal area Approach: Percutaneous Digit: No:4(Ring) Finger Laterality: Right-hand  Type: Local Anesthesia Local Anesthetic: Lidocaine 1-2% Sedation: None  Indication(s):  Analgesia Route: Infiltration (Rogers/IM) IV Access: N/A   Position: Sitting   1. Trigger finger of right hand, unspecified finger   2. Bilateral primary osteoarthritis of knee   3. Chronic radicular lumbar pain   4. Chronic pain of both knees   5. Lumbar facet joint syndrome   6. Lumbar facet arthropathy   7. Chronic pain syndrome    NAS-11 Pain score:   Pre-procedure: 5 /10   Post-procedure: 5 /10     Pre-op H&P Assessment:  [name] is a 66 y.o. (year old), male patient, seen today for interventional treatment. He  has a past surgical history that includes Knee surgery; Gastric bypass (2002); prostate seeding; Carpal tunnel  release (Right, 12/23/2014); Carpal tunnel release (Left, 01/06/2015); Colonoscopy with propofol (N/A, 10/08/2017); and Cardioversion (N/A, 05/15/2019). [name] has a current medication list which includes the following prescription(s): acetaminophen, amiodarone, benazepril, cyanocobalamin, cyclobenzaprine, ezetimibe, fluticasone, furosemide, hydrocodone-acetaminophen, [START ON 03/03/2021] hydrocodone-acetaminophen, ibuprofen, metoprolol succinate, multivitamin with minerals, rivaroxaban, rosuvastatin, shingrix, tramadol, and trazodone. His primarily concern today is the Finger Injury (Right, middle)  Initial Vital Signs:  Pulse/HCG Rate:  (!) 54   Temp: (!) 97.4 F (36.3 C) Resp: 16 BP: (!) 145/83 SpO2: 97 %  BMI: Estimated body mass index is 46.93 kg/m as calculated from the following:   Height as of this encounter: 6' (1.829 m).   Weight as of this encounter: 346 lb (156.9 kg).  Risk Assessment: Allergies: Reviewed. He has No Known Allergies.  Allergy Precautions: None required Coagulopathies: Reviewed. None identified.  Blood-thinner therapy: None at this time Active Infection(s): Reviewed. None identified. [name] is afebrile  Site Confirmation: [name] was asked to confirm the procedure and laterality before marking the site Procedure checklist: Completed Consent: Before the procedure and under the influence of no sedative(s), amnesic(s), or anxiolytics, the patient was informed of the treatment options, risks and possible complications. To fulfill our ethical and legal obligations, as recommended by the American Medical Association's Code of Ethics, I have informed the patient of my clinical impression; the nature and purpose of the treatment or procedure; the risks, benefits, and possible complications of the intervention; the alternatives, including doing nothing; the risk(s) and benefit(s) of the alternative treatment(s) or procedure(s); and the risk(s) and benefit(s) of  doing nothing. The patient was provided information about the general risks and possible complications associated with the procedure. These may include, but are not limited to: failure to achieve desired goals, infection, bleeding, organ  or nerve damage, allergic reactions, paralysis, and death. In addition, the patient was informed of those risks and complications associated to the procedure, such as failure to decrease pain; infection; bleeding; organ or nerve damage with subsequent damage to sensory, motor, and/or autonomic systems, resulting in permanent pain, numbness, and/or weakness of one or several areas of the body; allergic reactions; (i.e.: anaphylactic reaction); and/or death. Furthermore, the patient was informed of those risks and complications associated with the medications. These include, but are not limited to: allergic reactions (i.e.: anaphylactic or anaphylactoid reaction(s)); adrenal axis suppression; blood sugar elevation that in diabetics may result in ketoacidosis or comma; water retention that in patients with history of congestive heart failure may result in shortness of breath, pulmonary edema, and decompensation with resultant heart failure; weight gain; swelling or edema; medication-induced neural toxicity; particulate matter embolism and blood vessel occlusion with resultant organ, and/or nervous system infarction; and/or aseptic necrosis of one or more joints. Finally, the patient was informed that Medicine is not an exact science; therefore, there is also the possibility of unforeseen or unpredictable risks and/or possible complications that may result in a catastrophic outcome. The patient indicated having understood very clearly. We have given the patient no guarantees and we have made no promises. Enough time was given to the patient to ask questions, all of which were answered to the patient's satisfaction. [name] has indicated that he wanted to continue with the  procedure. Attestation: I, the ordering provider, attest that I have discussed with the patient the benefits, risks, side-effects, alternatives, likelihood of achieving goals, and potential problems during recovery for the procedure that I have provided informed consent. Date   Time: 02/01/2021 10:49 AM  Pre-Procedure Preparation:  Monitoring: As per clinic protocol. Respiration, ETCO2, SpO2, BP, heart rate and rhythm monitor placed and checked for adequate function Safety Precautions: Patient was assessed for positional comfort and pressure points before starting the procedure. Time-out: I initiated and conducted the "Time-out" before starting the procedure, as per protocol. The patient was asked to participate by confirming the accuracy of the "Time Out" information. Verification of the correct person, site, and procedure were performed and confirmed by me, the nursing staff, and the patient. "Time-out" conducted as per Joint Commission's Universal Protocol (UP.01.01.01). Time: 1125  Description of Procedure:          Area Prepped: Entire palmar and dorsal aspect of hand, up to forearm area. DuraPrep (Iodine Povacrylex [0.7% available iodine] and Isopropyl Alcohol, 74% w/w) Safety Precautions: Aspiration looking for blood return was conducted prior to all injections. At no point did we inject any substances, as a needle was being advanced. No attempts were made at seeking any paresthesias. Safe injection practices and needle disposal techniques used. Medications properly checked for expiration dates. SDV (single dose vial) medications used. Description of the Procedure: Protocol guidelines were followed. The patient was placed in position. The target area was identified and prepped in the usual manner. Skin & deeper tissues infiltrated with local anesthetic. Appropriate time provided for local anesthetics to take effect. The procedure needle was slowly advanced to target area. Proper needle placement  secured. Negative aspiration confirmed. Solution injected in intermittent fashion, asking for systemic symptoms every 0.5cc. Needle(s) removed and area cleaned, making sure to leave some prepping solution back to take advantage of its long term bactericidal properties.  Vitals:   02/01/21 1056  BP: (!) 145/83  Pulse: (!) 54  Resp: 16  Temp: (!) 97.4 F (36.3 C)  TempSrc: Temporal  SpO2: 97%  Weight: (!) 346 lb (156.9 kg)  Height: 6' (1.829 m)    Start Time: 1125 hrs. End Time: 1128 hrs. Materials:  Needle(s) Type: Regular needle Gauge: 25G Length: 1.5-in Medication(s): Please see orders for medications and dosing details. 2 cc solution made of 1 cc of 0.2% ropivacaine, 1 cc of Decadron 10 mg/cc.  Injected at A1 at the border of the FDS tendon   Post-operative Assessment:  Post-procedure Vital Signs:  Pulse/HCG Rate:  (!) 54  Temp:  (!) 97.4 F (36.3 C) Resp: 16 BP:  (!) 145/83 SpO2: 97 %  EBL: None  Complications: No immediate post-treatment complications observed by team, or reported by patient.  Note: The patient tolerated the entire procedure well. A repeat set of vitals were taken after the procedure and the patient was kept under observation following institutional policy, for this type of procedure. Post-procedural neurological assessment was performed, showing return to baseline, prior to discharge. The patient was provided with post-procedure discharge instructions, including a section on how to identify potential problems. Should any problems arise concerning this procedure, the patient was given instructions to immediately contact us, at any time, without hesitation. In any case, we plan to contact the patient by telephone for a follow-up status report regarding this interventional procedure.  Comments:  No additional relevant information.  Plan of Care  Orders:  Orders Placed This Encounter  Procedures   Radiofrequency,Lumbar    Standing Status:   Future     Standing Expiration Date:   08/01/2021    Scheduling Instructions:     Side(s): LEFT     Level(s): L3, L4, L5, Medial Branch Nerve(s)     Sedation: PO Valium (10 mg)     Scheduling Timeframe: As soon as pre-approved    Order Specific Question:   Where will this procedure be performed?    Answer:   [facility]   Magnesium    Indication: Pharmacologic therapy (Z79.899)    Order Specific Question:   CC Results    Answer:   PCP-NURSE [[id number]]    Order Specific Question:   Release to patient    Answer:   Immediate   Vitamin B12    Indication: Pharmacologic therapy (S06.301).    Order Specific Question:   CC Results    Answer:   PCP-NURSE [[id number]]    Order Specific Question:   Release to patient    Answer:   Immediate   25-Hydroxy vitamin D Lcms D2+D3    Indication: Disorder of skeletal system (M89.9).    Order Specific Question:   CC Results    Answer:   PCP-NURSE [[id number]]    Order Specific Question:   Release to patient    Answer:   Immediate   Chronic Opioid Analgesic:  Tramadol 50 mg 4 times daily as needed, quantity 120/month; MME equals 20    Medications ordered for procedure: Meds ordered this encounter  Medications   dexamethasone (DECADRON) injection 10 mg   lidocaine (XYLOCAINE) 2 % (with pres) injection 400 mg   HYDROcodone-acetaminophen (NORCO) 7.5-325 MG tablet    Sig: Take 1 tablet by mouth 3 (three) times daily as needed for severe pain. Must last 30 days    Dispense:  90 tablet    Refill:  0    Chronic Pain: STOP Act (Not applicable) Fill 1 day early if closed on refill date. Avoid benzodiazepines within 8 hours of opioids   HYDROcodone-acetaminophen (NORCO) 7.5-325 MG tablet  Sig: Take 1 tablet by mouth 3 (three) times daily as needed for severe pain. Must last 30 days    Dispense:  90 tablet    Refill:  0    Chronic Pain: STOP Act (Not applicable) Fill 1 day early if closed on refill date. Avoid benzodiazepines within 8 hours of opioids    Medications administered: We administered dexamethasone and lidocaine.  See the medical record for exact dosing, route, and time of administration.  Follow-up plan:   Return in about 2 weeks (around 02/15/2021) for Left L3, 4, 5 RFA , minimal sedation (PO Valium).       s/p lesi #1 (left L4/5) on 8/17, #2 on 10/21/2018 (left L3/4), #3 on 12/09/2018 (Left L3/4); 02/24/2019: Right wrist injection and left trigger finger (middle finger) injection.,  Bilateral wrist injection 10/15/2019, bilateral knee Hyalgan No. 1 01/26/1960. #2 02/23/2020, #3 03/24/2020.  Endorsing approximately 75 to 80% pain relief for bilateral knee pain.         Recent Visits Date Type Provider Dept  01/05/21 Office Visit Gillis Santa, MD [facility]  12/15/20 Procedure visit Gillis Santa, MD [facility]  12/01/20 Procedure visit Gillis Santa, MD [facility]  11/15/20 Office Visit Gillis Santa, MD [facility]  Showing recent visits within past 90 days and meeting all other requirements Today's Visits Date Type Provider Dept  02/01/21 Office Visit Gillis Santa, MD [facility]  Showing today's visits and meeting all other requirements Future Appointments Date Type Provider Dept  03/29/21 Appointment Gillis Santa, MD [facility]  Showing future appointments within next 90 days and meeting all other requirements  Disposition: Discharge home  Discharge (Date   Time): 02/01/2021; 1140 hrs.   Primary Care Physician: Olin Hauser, DO Location: [facility] Note by: Gillis Santa, MD Date: 02/01/2021; Time: 11:50 AM  Disclaimer:  Medicine is not an exact science. The only guarantee in medicine is that nothing is guaranteed. It is important to note that the decision to proceed with this intervention was based on the information collected from the patient. The Data and conclusions were drawn from the patient's questionnaire, the  interview, and the physical examination. Because the information was provided in large part by the patient, it cannot be guaranteed that it has not been purposely or unconsciously manipulated. Every effort has been made to obtain as much relevant data as possible for this evaluation. It is important to note that the conclusions that lead to this procedure are derived in large part from the available data. Always take into account that the treatment will also be dependent on availability of resources and existing treatment guidelines, considered by other Pain Management Practitioners as being common knowledge and practice, at the time of the intervention. For Medico-Legal purposes, it is also important to point out that variation in procedural techniques and pharmacological choices are the acceptable norm. The indications, contraindications, technique, and results of the above procedure should only be interpreted and judged by a Board-Certified Interventional Pain Specialist with extensive familiarity and expertise in the same exact procedure and technique.

## 2021-02-01 NOTE — Progress Notes (Signed)
Nursing Pain Medication Assessment:  Safety precautions to be maintained throughout the outpatient stay will include: orient to surroundings, keep bed in low position, maintain call bell within reach at all times, provide assistance with transfer out of bed and ambulation.  Medication Inspection Compliance: Pill count conducted under aseptic conditions, in front of the patient. Neither the pills nor the bottle was removed from the patient's sight at any time. Once count was completed pills were immediately returned to the patient in their original bottle.  Medication: Tramadol (Ultram) Pill/Patch Count:  92 of 120 pills remain Pill/Patch Appearance: Markings consistent with prescribed medication Bottle Appearance: Standard pharmacy container. Clearly labeled. Filled Date: 01 / 02 / 2023 Last Medication intake:  Today

## 2021-02-01 NOTE — Progress Notes (Signed)
PROVIDER NOTE: Information contained herein reflects review and annotations entered in association with encounter. Interpretation of such information and data should be left to medically-trained personnel. Information provided to patient can be located elsewhere in the medical record under "Patient Instructions". Document created using STT-dictation technology, any transcriptional errors that may result from process are unintentional.    Patient: [name]  Service Category: E/M  Provider: Gillis Santa, MD  DOB: [birth year]  DOS: 02/01/2021  Specialty: Interventional Pain Management  MRN: [id number]  Setting: Ambulatory outpatient  PCP: Olin Hauser, DO  Type: Established Patient    Referring Provider: Nobie Putnam *  Location: Office  Delivery: Face-to-face     HPI  [name], a 66 y.o. year old male, is here today because of his Trigger finger of right hand, unspecified finger [M65.30]. [name] primary complain today is Finger Injury (Right, middle) Last encounter: My last encounter with him was on 01/05/2021. Pertinent problems: [name] has H/O gastric bypass; DJD (degenerative joint disease) of knee; Bilateral carpal tunnel syndrome; Paroxysmal atrial fibrillation ([place]); Chronic radicular lumbar pain; Lumbar radiculopathy; Lumbar degenerative disc disease; Lumbar facet arthropathy; Lumbar facet joint syndrome; Primary osteoarthritis of both wrists; Trigger middle finger of left hand; and Chronic pain syndrome on their pertinent problem list. Pain Assessment: Severity of Chronic pain is reported as a 5 /10. Location: Finger (Comment which one) Right/denies. Onset: More than a month ago. Quality: Aching. Timing: Constant. Modifying factor(s): Ibuprofen. Vitals:  height is 6' (1.829 m) and weight is 346 lb (156.9 kg) (abnormal). His temporal temperature is 97.4 F (36.3 C) (abnormal). His blood pressure is 145/83 (abnormal) and his pulse is 54 (abnormal). His  respiration is 16 and oxygen saturation is 97%.   Reason for encounter: medication management and right ring finger trigger finger injection  Increased lower back pain secondary to lumbar facet arthropathy and lumbar spondylosis that was further aggravated by his fall last November. Have discussed lumbar radiofrequency ablation for the purpose of obtaining longer-term pain relief regards to his low back pain.  Risk and benefits reviewed and patient would like to start with the left side first. Patient would like to discontinue gabapentin as he states it is not helping.  Wean schedule provided, decrease to 400 mg twice daily for 3 days then 400 mg nightly for 3 days then discontinue. Decreased analgesic response with tramadol at current dose, opioid rotation/escalation to hydrocodone 7.5 mg 3 times daily.  MME equals 22.5.  We will assess response to medication change.  Pharmacotherapy Assessment  Analgesic: Tramadol 50 mg 4 times daily as needed, quantity 120/month; MME equals 20-->transition to hydrocodone 7.5 mg 3 times daily as needed     Monitoring: [place] PMP: PDMP reviewed during this encounter.       Pharmacotherapy: No side-effects or adverse reactions reported. Compliance: No problems identified. Effectiveness: Clinically acceptable.  Landis Martins, RN  02/01/2021 11:05 AM  Sign when Signing Visit Nursing Pain Medication Assessment:  Safety precautions to be maintained throughout the outpatient stay will include: orient to surroundings, keep bed in low position, maintain call bell within reach at all times, provide assistance with transfer out of bed and ambulation.  Medication Inspection Compliance: Pill count conducted under aseptic conditions, in front of the patient. Neither the pills nor the bottle was removed from the patient's sight at any time. Once count was completed pills were immediately returned to the patient in their original bottle.  Medication: Tramadol  (Ultram) Pill/Patch  Count:  92 of 120 pills remain Pill/Patch Appearance: Markings consistent with prescribed medication Bottle Appearance: Standard pharmacy container. Clearly labeled. Filled Date: 01 / 02 / 2023 Last Medication intake:  Today   UDS:  Summary  Date Value Ref Range Status  04/06/2020 Note  Final    Comment:    ==================================================================== [employer] Select 13 (MW) ==================================================================== Test                             Result       Flag       Units  Drug Present and Declared for Prescription Verification   Tramadol                       >4386        EXPECTED   ng/mg creat   O-Desmethyltramadol            3353         EXPECTED   ng/mg creat   N-Desmethyltramadol            >4386        EXPECTED   ng/mg creat    Source of tramadol is a prescription medication. O-desmethyltramadol    and N-desmethyltramadol are expected metabolites of tramadol.  ==================================================================== Test                      Result    Flag   Units      Ref Range   Creatinine              114              mg/dL      >=20 ==================================================================== Declared Medications:  The flagging and interpretation on this report are based on the  following declared medications.  Unexpected results may arise from  inaccuracies in the declared medications.   **Note: The testing scope of this panel includes these medications:   Tramadol   **Note: The testing scope of this panel does not include the  following reported medications:   Acetaminophen (Tylenol)  Amiodarone  Amlodipine  Benazepril  Cyclobenzaprine (Flexeril)  Ezetimibe  Fluticasone  Furosemide  Gabapentin  Menthol  Methyl Salicylate (topical)  Metoprolol  Mirtazapine  Multivitamin  Rivaroxaban  Rosuvastatin  Trazodone  Vitamin  B12 ==================================================================== For clinical consultation, please call [phone]. ====================================================================      ROS  Constitutional: Denies any fever or chills Gastrointestinal: No reported hemesis, hematochezia, vomiting, or acute GI distress Musculoskeletal:  Low back pain, worse with facet loading Neurological: No reported episodes of acute onset apraxia, aphasia, dysarthria, agnosia, amnesia, paralysis, loss of coordination, or loss of consciousness  Medication Review  HYDROcodone-acetaminophen, Zoster Vaccine Adjuvanted, acetaminophen, amiodarone, benazepril, cyanocobalamin, cyclobenzaprine, ezetimibe, fluticasone, furosemide, ibuprofen, metoprolol succinate, multivitamin with minerals, rivaroxaban, rosuvastatin, traMADol, and traZODone  History Review  Allergy: [name] has No Known Allergies. Drug: [name]  reports no history of drug use. Alcohol:  reports current alcohol use of about 12.0 standard drinks per week. Tobacco:  reports that he quit smoking about 41 years ago. His smoking use included cigarettes. He has a 10.00 pack-year smoking history. He has quit using smokeless tobacco.  His smokeless tobacco use included snuff. Social: [name]  reports that he quit smoking about 41 years ago. His smoking use included cigarettes. He has a 10.00 pack-year smoking history. He has quit using smokeless tobacco.  His smokeless tobacco use included snuff. He reports current alcohol use of about 12.0 standard drinks per week. He reports that he does not use drugs. Medical:  has a past medical history of Cardiomyopathy (in setting of Afib), DJD (degenerative joint disease) of knee, History of kidney stones, Intervertebral disc disorder with radiculopathy of lumbosacral region, Kidney stone (2012), Lower extremity edema, PAF (paroxysmal atrial fibrillation) ([place]), Pernicious anemia, Prostate cancer  ([place]), Pulmonary nodule, right, and SVT (supraventricular tachycardia) ([place]). Surgical: [name]  has a past surgical history that includes Knee surgery; Gastric bypass (2002); prostate seeding; Carpal tunnel release (Right, 12/23/2014); Carpal tunnel release (Left, 01/06/2015); Colonoscopy with propofol (N/A, 10/08/2017); and Cardioversion (N/A, 05/15/2019). Family: family history includes Arrhythmia in his brother and sister; Cancer in his father; Heart disease in his father; Hypertension in his father; Stroke in his father.  Laboratory Chemistry Profile   Renal Lab Results  Component Value Date   BUN 17 07/21/2020   CREATININE 1.21 41/28/7867   BCR NOT APPLICABLE 67/20/9470   GFRAA 72 07/21/2020   GFRNONAA 62 07/21/2020    Hepatic Lab Results  Component Value Date   AST 15 07/21/2020   ALT 11 07/21/2020   ALBUMIN 4.6 08/01/2019   ALKPHOS 72 08/01/2019   LIPASE 28 03/13/2017    Electrolytes Lab Results  Component Value Date   NA 143 07/21/2020   K 4.2 07/21/2020   CL 108 07/21/2020   CALCIUM 9.0 07/21/2020   MG 2.0 05/05/2019    Bone No results found for: VD25OH, VD125OH2TOT, JG2836OQ9, UT6546TK3, 25OHVITD1, 25OHVITD2, 25OHVITD3, TESTOFREE, TESTOSTERONE  Inflammation (CRP: Acute Phase) (ESR: Chronic Phase) No results found for: CRP, ESRSEDRATE, LATICACIDVEN       Note: Above Lab results reviewed.    Physical Exam  General appearance: Well nourished, well developed, and well hydrated. In no apparent acute distress Mental status: Alert, oriented x 3 (person, place, & time)       Respiratory: No evidence of acute respiratory distress Eyes: PERLA Vitals: BP (!) 145/83    Pulse (!) 54    Temp (!) 97.4 F (36.3 C) (Temporal)    Resp 16    Ht 6' (1.829 m)    Wt (!) 346 lb (156.9 kg)    SpO2 97%    BMI 46.93 kg/m  BMI: Estimated body mass index is 46.93 kg/m as calculated from the following:   Height as of this encounter: 6' (1.829 m).   Weight as of this encounter: 346  lb (156.9 kg). Ideal: Ideal body weight: 77.6 kg (171 lb 1.2 oz) Adjusted ideal body weight: 109.3 kg (241 lb 0.7 oz)  Right ring trigger finger  Lumbar Spine Area Exam  Skin & Axial Inspection: No masses, redness, or swelling Alignment: Symmetrical Functional ROM: Pain restricted ROM       Stability: No instability detected Muscle Tone/Strength: Functionally intact. No obvious neuro-muscular anomalies detected. Sensory (Neurological): Musculoskeletal pain pattern Palpation: No palpable anomalies       Provocative Tests: Hyperextension/rotation test: (+) bilaterally for facet joint pain. Lumbar quadrant test (Kemp's test): (+) bilaterally for facet joint pain. Gait & Posture Assessment  Ambulation: Unassisted Gait: Relatively normal for age and body habitus Posture: WNL  Lower Extremity Exam      Side: Right lower extremity   Side: Left lower extremity  Stability: No instability observed           Stability: No instability observed          Skin & Extremity  Inspection: Skin color, temperature, and hair growth are WNL. No peripheral edema or cyanosis. No masses, redness, swelling, asymmetry, or associated skin lesions. No contractures.   Skin & Extremity Inspection: Skin color, temperature, and hair growth are WNL. No peripheral edema or cyanosis. No masses, redness, swelling, asymmetry, or associated skin lesions. No contractures.  Functional ROM: Pain restricted ROM for hip and knee joints           Functional ROM: Pain restricted ROM for hip and knee joints          Muscle Tone/Strength: Functionally intact. No obvious neuro-muscular anomalies detected.   Muscle Tone/Strength: Functionally intact. No obvious neuro-muscular anomalies detected.  Sensory (Neurological): Unimpaired         Sensory (Neurological): Unimpaired        DTR: Patellar: deferred today Achilles: deferred today Plantar: deferred today   DTR: Patellar: deferred today Achilles: deferred today Plantar: deferred  today  Palpation: No palpable anomalies   Palpation: No palpable anomalies     Assessment   Status Diagnosis  Controlled Controlled Controlled 1. Trigger finger of right hand, unspecified finger   2. Bilateral primary osteoarthritis of knee   3. Chronic radicular lumbar pain   4. Chronic pain of both knees   5. Lumbar facet joint syndrome   6. Lumbar facet arthropathy   7. Chronic pain syndrome       Plan of Care   [name] has a current medication list which includes the following long-term medication(s): amiodarone, benazepril, ezetimibe, fluticasone, furosemide, metoprolol succinate, rivaroxaban, rosuvastatin, and trazodone.  Pharmacotherapy (Medications Ordered): Meds ordered this encounter  Medications   dexamethasone (DECADRON) injection 10 mg   lidocaine (XYLOCAINE) 2 % (with pres) injection 400 mg   HYDROcodone-acetaminophen (NORCO) 7.5-325 MG tablet    Sig: Take 1 tablet by mouth 3 (three) times daily as needed for severe pain. Must last 30 days    Dispense:  90 tablet    Refill:  0    Chronic Pain: STOP Act (Not applicable) Fill 1 day early if closed on refill date. Avoid benzodiazepines within 8 hours of opioids   HYDROcodone-acetaminophen (NORCO) 7.5-325 MG tablet    Sig: Take 1 tablet by mouth 3 (three) times daily as needed for severe pain. Must last 30 days    Dispense:  90 tablet    Refill:  0    Chronic Pain: STOP Act (Not applicable) Fill 1 day early if closed on refill date. Avoid benzodiazepines within 8 hours of opioids   Orders:  Orders Placed This Encounter  Procedures   Radiofrequency,Lumbar    Standing Status:   Future    Standing Expiration Date:   08/01/2021    Scheduling Instructions:     Side(s): LEFT     Level(s): L3, L4, L5, Medial Branch Nerve(s)     Sedation: PO Valium (10 mg)     Scheduling Timeframe: As soon as pre-approved    Order Specific Question:   Where will this procedure be performed?    Answer:   [facility]   Magnesium    Indication: Pharmacologic therapy (Z79.899)    Order Specific Question:   CC Results    Answer:   PCP-NURSE [[id number]]    Order Specific Question:   Release to patient    Answer:   Immediate   Vitamin B12    Indication: Pharmacologic therapy (L89.373).    Order Specific Question:   CC Results    Answer:  PCP-NURSE [[id number]]    Order Specific Question:   Release to patient    Answer:   Immediate   25-Hydroxy vitamin D Lcms D2+D3    Indication: Disorder of skeletal system (M89.9).    Order Specific Question:   CC Results    Answer:   PCP-NURSE [[id number]]    Order Specific Question:   Release to patient    Answer:   Immediate   Follow-up plan:   Return in about 2 weeks (around 02/15/2021) for Left L3, 4, 5 RFA , minimal sedation (PO Valium).     s/p lesi #1 (left L4/5) on 8/17, #2 on 10/21/2018 (left L3/4), #3 on 12/09/2018 (Left L3/4); 02/24/2019: Right wrist injection and left trigger finger (middle finger) injection.,  Bilateral wrist injection 10/15/2019, bilateral knee Hyalgan No. 1 01/26/1960. #2 02/23/2020, #3 03/24/2020.  Endorsing approximately 75 to 80% pain relief for bilateral knee pain.         Recent Visits Date Type Provider Dept  01/05/21 Office Visit Gillis Santa, MD [facility]  12/15/20 Procedure visit Gillis Santa, MD [facility]  12/01/20 Procedure visit Gillis Santa, MD [facility]  11/15/20 Office Visit Gillis Santa, MD [facility]  Showing recent visits within past 90 days and meeting all other requirements Today's Visits Date Type Provider Dept  02/01/21 Office Visit Gillis Santa, MD [facility]  Showing today's visits and meeting all other requirements Future Appointments Date Type Provider Dept  03/29/21 Appointment Gillis Santa, MD [facility]  Showing future appointments within next 90 days and meeting all other requirements  I discussed the assessment and treatment plan  with the patient. The patient was provided an opportunity to ask questions and all were answered. The patient agreed with the plan and demonstrated an understanding of the instructions.  Patient advised to call back or seek an in-person evaluation if the symptoms or condition worsens.  Duration of encounter: 30 minutes.  Note by: Gillis Santa, MD Date: 02/01/2021; Time: 11:46 AM

## 2021-02-01 NOTE — Patient Instructions (Signed)
______________________________________________________________________ ° °Preparing for your procedure (without sedation) ° °Procedure appointments are limited to planned procedures: °No Prescription Refills. °No disability issues will be discussed. °No medication changes will be discussed. ° °Instructions: °Oral Intake: Do not eat or drink anything for at least 6 hours prior to your procedure. (Exception: Blood Pressure Medication. See below.) °Transportation: Unless otherwise stated by your physician, you may drive yourself after the procedure. °Blood Pressure Medicine: Do not forget to take your blood pressure medicine with a sip of water the morning of the procedure. If your Diastolic (lower reading)is above 100 mmHg, elective cases will be cancelled/rescheduled. °Blood thinners: These will need to be stopped for procedures. Notify our staff if you are taking any blood thinners. Depending on which one you take, there will be specific instructions on how and when to stop it. °Diabetics on insulin: Notify the staff so that you can be scheduled 1st case in the morning. If your diabetes requires high dose insulin, take only ½ of your normal insulin dose the morning of the procedure and notify the staff that you have done so. °Preventing infections: Shower with an antibacterial soap the morning of your procedure.  °Build-up your immune system: Take 1000 mg of Vitamin C with every meal (3 times a day) the day prior to your procedure. °Antibiotics: Inform the staff if you have a condition or reason that requires you to take antibiotics before dental procedures. °Pregnancy: If you are pregnant, call and cancel the procedure. °Sickness: If you have a cold, fever, or any active infections, call and cancel the procedure. °Arrival: You must be in the facility at least 30 minutes prior to your scheduled procedure. °Children: Do not bring any children with you. °Dress appropriately: Bring dark clothing that you would not mind  if they get stained. °Valuables: Do not bring any jewelry or valuables. ° °Reasons to call and reschedule or cancel your procedure: (Following these recommendations will minimize the risk of a serious complication.) °Surgeries: Avoid having procedures within 2 weeks of any surgery. (Avoid for 2 weeks before or after any surgery). °Flu Shots: Avoid having procedures within 2 weeks of a flu shots or . (Avoid for 2 weeks before or after immunizations). °Barium: Avoid having a procedure within 7-10 days after having had a radiological study involving the use of radiological contrast. (Myelograms, Barium swallow or enema study). °Heart attacks: Avoid any elective procedures or surgeries for the initial 6 months after a "Myocardial Infarction" (Heart Attack). °Blood thinners: It is imperative that you stop these medications before procedures. Let us know if you if you take any blood thinner.  °Infection: Avoid procedures during or within two weeks of an infection (including chest colds or gastrointestinal problems). Symptoms associated with infections include: Localized redness, fever, chills, night sweats or profuse sweating, burning sensation when voiding, cough, congestion, stuffiness, runny nose, sore throat, diarrhea, nausea, vomiting, cold or Flu symptoms, recent or current infections. It is specially important if the infection is over the area that we intend to treat. °Heart and lung problems: Symptoms that may suggest an active cardiopulmonary problem include: cough, chest pain, breathing difficulties or shortness of breath, dizziness, ankle swelling, uncontrolled high or unusually low blood pressure, and/or palpitations. If you are experiencing any of these symptoms, cancel your procedure and contact your primary care physician for an evaluation. ° °Remember:  °Regular Business hours are:  °Monday to Thursday 8:00 AM to 4:00 PM ° °Provider's Schedule: °Francisco Naveira, MD:  °Procedure days: Tuesday and Thursday    7:30 AM to 4:00 PM  Gillis Santa, MD:  Procedure days: Monday and Wednesday 7:30 AM to 4:00 PM ______________________________________________________________________  Stop Xarelto 3 days.

## 2021-02-07 LAB — 25-HYDROXY VITAMIN D LCMS D2+D3
25-Hydroxy, Vitamin D-2: <1 ng/mL
25-Hydroxy, Vitamin D-3: 32 ng/mL
25-Hydroxy, Vitamin D: 32 ng/mL

## 2021-02-07 LAB — MAGNESIUM: Magnesium: 2 mg/dL (ref 1.6–2.3)

## 2021-02-07 LAB — VITAMIN B12: Vitamin B-12: 509 pg/mL (ref 232–1245)

## 2021-02-11 ENCOUNTER — Ambulatory Visit (INDEPENDENT_AMBULATORY_CARE_PROVIDER_SITE_OTHER): Payer: Medicare Other

## 2021-02-11 ENCOUNTER — Other Ambulatory Visit: Payer: Self-pay

## 2021-02-11 VITALS — BP 140/86 | HR 58 | Temp 98.2°F | Ht 72.0 in | Wt 346.5 lb

## 2021-02-11 DIAGNOSIS — Z0000 Encounter for general adult medical examination without abnormal findings: Secondary | ICD-10-CM | POA: Diagnosis not present

## 2021-02-11 NOTE — Progress Notes (Signed)
Subjective:   [name] is a 66 y.o. male who presents for an Initial Medicare Annual Wellness Visit.  Review of Systems           Objective:    Today's Vitals   02/11/21 1418 02/11/21 1433  BP: 140/86   Pulse: (!) 58   Temp: 98.2 F (36.8 C)   TempSrc: Oral   SpO2: 97%   Weight: (!) 346 lb 8 oz (157.2 kg)   Height: 6' (1.829 m)   PainSc:  0-No pain   Body mass index is 46.99 kg/m.  Advanced Directives 02/01/2021 12/01/2020 10/04/2020 08/16/2020 07/22/2020 06/07/2020 05/19/2020  Does Patient Have a Medical Advance Directive? No No No No No No No  Would patient like information on creating a medical advance directive? - No - Patient declined No - Patient declined No - Patient declined No - Patient declined No - Patient declined -    Current Medications (verified) Outpatient Encounter Medications as of 02/11/2021  Medication Sig   acetaminophen (TYLENOL) 500 MG tablet Take 1,000 mg by mouth every 6 (six) hours as needed for moderate pain or headache.   amiodarone (PACERONE) 200 MG tablet Take 1 tablet (200 mg total) by mouth daily.   benazepril (LOTENSIN) 40 MG tablet TAKE 1 TABLET(40 MG) BY MOUTH DAILY   cyanocobalamin (,VITAMIN B-12,) 1000 MCG/ML injection INJECT 1ML EVERY 30 DAYS AS DIRECTED   cyclobenzaprine (FLEXERIL) 10 MG tablet TAKE ONE TABLET BY MOUTH THREE TIMES A DAY AS NEEDED FOR MUSCLE SPASM   ezetimibe (ZETIA) 10 MG tablet TAKE 1 TABLET(10 MG) BY MOUTH DAILY   fluticasone (FLONASE) 50 MCG/ACT nasal spray USE TWO SPRAYS IN THE AFFECTED NOSTRIL ONE TIME DAILY   furosemide (LASIX) 20 MG tablet Take 1 tablet (20 mg total) by mouth daily as needed.   HYDROcodone-acetaminophen (NORCO) 7.5-325 MG tablet Take 1 tablet by mouth 3 (three) times daily as needed for severe pain. Must last 30 days   [START ON 03/03/2021] HYDROcodone-acetaminophen (NORCO) 7.5-325 MG tablet Take 1 tablet by mouth 3 (three) times daily as needed for severe pain. Must last 30 days   ibuprofen  (ADVIL) 200 MG tablet Take 200 mg by mouth daily.   metoprolol succinate (TOPROL-XL) 50 MG 24 hr tablet Take 0.5 tablets (25 mg total) by mouth daily. TAKE 1 TABLET(50 MG) BY MOUTH DAILY WITH OR IMMEDIATELY FOLLOWING A MEAL   Multiple Vitamin (MULTIVITAMIN WITH MINERALS) TABS tablet Take 1 tablet by mouth daily.   rivaroxaban (XARELTO) 20 MG TABS tablet Take 1 tablet (20 mg total) by mouth daily with supper.   rosuvastatin (CRESTOR) 10 MG tablet TAKE 1 TABLET(10 MG) BY MOUTH AT BEDTIME   SHINGRIX injection Inject 0.5 mL into muscle for shingles vaccine. Repeat dose in 2-6 months.   traMADol (ULTRAM) 50 MG tablet Take 1 tablet (50 mg total) by mouth every 6 (six) hours as needed for severe pain.   traZODone (DESYREL) 150 MG tablet Take 1 tablet (150 mg total) by mouth at bedtime.   No facility-administered encounter medications on file as of 02/11/2021.    Allergies (verified) Patient has no known allergies.   History: Past Medical History:  Diagnosis Date   Cardiomyopathy (in setting of Afib)    a. 12/2013 Echo: EF 45-50%, mild ant and antsept HK. mild MR. Mod dil LA. nl RV fxn. Rhythm was Afib.   DJD (degenerative joint disease) of knee    History of kidney stones    Intervertebral disc disorder  with radiculopathy of lumbosacral region    Kidney stone 2012   Lower extremity edema    PAF (paroxysmal atrial fibrillation) (HCC)    Pernicious anemia    Prostate cancer ([place])    Pulmonary nodule, right    a. 10/2014 Cardiac CTA: 51mm RLL nodule; b. 04/2015 CT Chest: stable 3mm RLL nodule. No new nodules; 02/2017 CTA Chest: stable, benign, 41mm RLL pulm nodule.   SVT (supraventricular tachycardia) [facility])    Past Surgical History:  Procedure Laterality Date   CARDIOVERSION N/A 05/15/2019   Procedure: CARDIOVERSION;  Surgeon: Minna Merritts, MD;  Location: [facility] ORS;  Service: Cardiovascular;  Laterality: N/A;   CARPAL TUNNEL RELEASE Right 12/23/2014   Procedure: CARPAL TUNNEL RELEASE;   Surgeon: Earnestine Leys, MD;  Location: [facility] ORS;  Service: Orthopedics;  Laterality: Right;   CARPAL TUNNEL RELEASE Left 01/06/2015   Procedure: CARPAL TUNNEL RELEASE;  Surgeon: Earnestine Leys, MD;  Location: [facility] ORS;  Service: Orthopedics;  Laterality: Left;   COLONOSCOPY WITH PROPOFOL N/A 10/08/2017   Procedure: COLONOSCOPY WITH PROPOFOL;  Surgeon: Lin Landsman, MD;  Location: [facility] ENDOSCOPY;  Service: Gastroenterology;  Laterality: N/A;   GASTRIC BYPASS  2002   KNEE SURGERY     knee trauma   prostate seeding     Family History  Problem Relation Age of Onset   Heart disease Father    Hypertension Father    Stroke Father    Cancer Father    Arrhythmia Sister        A-fib   Arrhythmia Brother        A-fib   Social History   Socioeconomic History   Marital status: Married    Spouse name: Not on file   Number of children: Not on file   Years of education: Not on file   Highest education level: Not on file  Occupational History   Not on file  Tobacco Use   Smoking status: Former    Packs/day: 1.00    Years: 10.00    Pack years: 10.00    Types: Cigarettes    Quit date: 02/18/1979    Years since quitting: 42.0   Smokeless tobacco: Former    Types: Snuff  Vaping Use   Vaping Use: Never used  Substance and Sexual Activity   Alcohol use: Yes    Alcohol/week: 12.0 standard drinks    Types: 12 Cans of beer per week    Comment: weekly   Drug use: No   Sexual activity: Not on file  Other Topics Concern   Not on file  Social History Narrative   Not on file   Social Determinants of Health   Financial Resource Strain: Not on file  Food Insecurity: Not on file  Transportation Needs: Not on file  Physical Activity: Not on file  Stress: Not on file  Social Connections: Not on file    Tobacco Counseling Counseling given: Not Answered   Clinical Intake:  Pre-visit preparation completed: Yes  Pain : No/denies pain Pain Score: 0-No pain     Nutritional  Status: BMI > 30  Obese Nutritional Risks: None Diabetes: No  How often do you need to have someone help you when you read instructions, pamphlets, or other written materials from your doctor or pharmacy?: 1 - Never  Diabetic?no  Interpreter Needed?: No  Information entered by :: Kirke Shaggy, LPN   Activities of Daily Living No flowsheet data found.  Patient Care Team: Olin Hauser, DO as PCP -  General (Family Medicine) Rockey Situ Kathlene November, MD as PCP - Cardiology (Cardiology)  Indicate any recent Medical Services you may have received from other than [facility] providers in the past year (date may be approximate).     Assessment:   This is a routine wellness examination for [place].  Hearing/Vision screen No results found.  Dietary issues and exercise activities discussed:     Goals Addressed   None    Depression Screen PHQ 2/9 Scores 02/01/2021 12/01/2020 10/04/2020 08/16/2020 07/22/2020 01/08/2020 09/23/2019  PHQ - 2 Score 0 0 0 0 0 0 0  PHQ- 9 Score - - - - - - -    Fall Risk Fall Risk  02/01/2021 12/15/2020 12/01/2020 10/04/2020 08/16/2020  Falls in the past year? 0 1 1 0 1  Comment - - End of 10/2020 - -  Number falls in past yr: - 0 0 - 1  Injury with Fall? - 1 1 - 1  Comment - - - - -  Risk for fall due to : - - History of fall(s) - Impaired balance/gait  Follow up - - - - -    FALL [place]:  Any stairs in or around the home? Yes  If so, are there any without handrails? No  Home free of loose throw rugs in walkways, pet beds, electrical cords, etc? Yes  Adequate lighting in your home to reduce risk of falls? Yes   ASSISTIVE DEVICES UTILIZED TO PREVENT FALLS:  Life alert? No  Use of a cane, walker or w/c? Yes  Grab bars in the bathroom? Yes  Shower chair or bench in shower? No  Elevated toilet seat or a handicapped toilet? No   TIMED UP AND GO:  Was the test performed? Yes .  Length of time to ambulate 10 feet: 6 sec.    Gait slow and steady without use of assistive device  Cognitive Function:        Immunizations Immunization History  Administered Date(s) Administered   Influenza Inj Mdck Quad Pf 12/12/2016   Influenza,inj,Quad PF,6+ Mos 11/06/2017   Moderna Sars-Covid-2 Vaccination 08/24/2019, 09/28/2019   Tdap 02/19/2012    TDAP status: Up to date  Flu Vaccine status: Declined, Education has been provided regarding the importance of this vaccine but patient still declined. Advised may receive this vaccine at local pharmacy or Health Dept. Aware to provide a copy of the vaccination record if obtained from local pharmacy or Health Dept. Verbalized acceptance and understanding.  Pneumococcal vaccine status: Declined,  Education has been provided regarding the importance of this vaccine but patient still declined. Advised may receive this vaccine at local pharmacy or Health Dept. Aware to provide a copy of the vaccination record if obtained from local pharmacy or Health Dept. Verbalized acceptance and understanding.   Covid-19 vaccine status: Completed vaccines  Qualifies for Shingles Vaccine? Yes   Zostavax completed No   Shingrix Completed?: No.    Education has been provided regarding the importance of this vaccine. Patient has been advised to call insurance company to determine out of pocket expense if they have not yet received this vaccine. Advised may also receive vaccine at local pharmacy or Health Dept. Verbalized acceptance and understanding.  Screening Tests Health Maintenance  Topic Date Due   Pneumonia Vaccine 17+ Years old (1 - PCV) Never done   Zoster Vaccines- Shingrix (1 of 2) Never done   COVID-19 Vaccine (3 - Moderna risk series) 10/26/2019   INFLUENZA VACCINE  08/16/2020   TETANUS/TDAP  02/18/2022   COLONOSCOPY (Pts 45-84yrs Insurance coverage will need to be confirmed)  10/09/2022   Hepatitis C Screening  Completed   HPV VACCINES  Aged Out    Health  Maintenance  Health Maintenance Due  Topic Date Due   Pneumonia Vaccine 71+ Years old (1 - PCV) Never done   Zoster Vaccines- Shingrix (1 of 2) Never done   COVID-19 Vaccine (3 - Moderna risk series) 10/26/2019   INFLUENZA VACCINE  08/16/2020    Colorectal cancer screening: Type of screening: Colonoscopy. Completed 10/08/17. Repeat every 5 years  Lung Cancer Screening: (Low Dose CT Chest recommended if Age 13-80 years, 30 pack-year currently smoking OR have quit w/in 15years.) does not qualify.   Additional Screening:  Hepatitis C Screening: does qualify; Completed 04/28/15  Vision Screening: Recommended annual ophthalmology exams for early detection of glaucoma and other disorders of the eye. Is the patient up to date with their annual eye exam?  Yes  Who is the provider or what is the name of the office in which the patient attends annual eye exams? [facility] If pt is not established with a provider, would they like to be referred to a provider to establish care? No .   Dental Screening: Recommended annual dental exams for proper oral hygiene  Community Resource Referral / Chronic Care Management: CRR required this visit?  No   CCM required this visit?  No      Plan:     I have personally reviewed and noted the following in the patients chart:   Medical and social history Use of alcohol, tobacco or illicit drugs  Current medications and supplements including opioid prescriptions. Patient is currently taking opioid prescriptions. Information provided to patient regarding non-opioid alternatives. Patient advised to discuss non-opioid treatment plan with their provider. Functional ability and status Nutritional status Physical activity Advanced directives List of other physicians Hospitalizations, surgeries, and ER visits in previous 12 months Vitals Screenings to include cognitive, depression, and falls Referrals and appointments  In addition, I have  reviewed and discussed with patient certain preventive protocols, quality metrics, and best practice recommendations. A written personalized care plan for preventive services as well as general preventive health recommendations were provided to patient.     Dionisio David, LPN   07/15/4763   Nurse Notes: none

## 2021-02-11 NOTE — Patient Instructions (Signed)
[name] , Thank you for taking time to come for your Medicare Wellness Visit. I appreciate your ongoing commitment to your health goals. Please review the following plan we discussed and let me know if I can assist you in the future.   Screening recommendations/referrals: Colonoscopy: 10/08/17 Recommended yearly ophthalmology/optometry visit for glaucoma screening and checkup Recommended yearly dental visit for hygiene and checkup  Vaccinations: Influenza vaccine: due, declined Pneumococcal vaccine: n/d Tdap vaccine: 02/19/12 Shingles vaccine: n/d   Covid-19: 08/24/19, 09/28/19 Advanced directives: no  Conditions/risks identified: none  Next appointment: Follow up in one year for your annual wellness visit. 02/17/22 @ 2pm in person  Preventive Care 11 Years and Older, Male Preventive care refers to lifestyle choices and visits with your health care provider that can promote health and wellness. What does preventive care include? A yearly physical exam. This is also called an annual well check. Dental exams once or twice a year. Routine eye exams. Ask your health care provider how often you should have your eyes checked. Personal lifestyle choices, including: Daily care of your teeth and gums. Regular physical activity. Eating a healthy diet. Avoiding tobacco and drug use. Limiting alcohol use. Practicing safe sex. Taking low doses of aspirin every day. Taking vitamin and mineral supplements as recommended by your health care provider. What happens during an annual well check? The services and screenings done by your health care provider during your annual well check will depend on your age, overall health, lifestyle risk factors, and family history of disease. Counseling  Your health care provider may ask you questions about your: Alcohol use. Tobacco use. Drug use. Emotional well-being. Home and relationship well-being. Sexual activity. Eating habits. History of  falls. Memory and ability to understand (cognition). Work and work Statistician. Screening  You may have the following tests or measurements: Height, weight, and BMI. Blood pressure. Lipid and cholesterol levels. These may be checked every 5 years, or more frequently if you are over 83 years old. Skin check. Lung cancer screening. You may have this screening every year starting at age 28 if you have a 30-pack-year history of smoking and currently smoke or have quit within the past 15 years. Fecal occult blood test (FOBT) of the stool. You may have this test every year starting at age 94. Flexible sigmoidoscopy or colonoscopy. You may have a sigmoidoscopy every 5 years or a colonoscopy every 10 years starting at age 55. Prostate cancer screening. Recommendations will vary depending on your family history and other risks. Hepatitis C blood test. Hepatitis B blood test. Sexually transmitted disease (STD) testing. Diabetes screening. This is done by checking your blood sugar (glucose) after you have not eaten for a while (fasting). You may have this done every 1-3 years. Abdominal aortic aneurysm (AAA) screening. You may need this if you are a current or former smoker. Osteoporosis. You may be screened starting at age 2 if you are at high risk. Talk with your health care provider about your test results, treatment options, and if necessary, the need for more tests. Vaccines  Your health care provider may recommend certain vaccines, such as: Influenza vaccine. This is recommended every year. Tetanus, diphtheria, and acellular pertussis (Tdap, Td) vaccine. You may need a Td booster every 10 years. Zoster vaccine. You may need this after age 61. Pneumococcal 13-valent conjugate (PCV13) vaccine. One dose is recommended after age 9. Pneumococcal polysaccharide (PPSV23) vaccine. One dose is recommended after age 32. Talk to your health care provider about  which screenings and vaccines you need and  how often you need them. This information is not intended to replace advice given to you by your health care provider. Make sure you discuss any questions you have with your health care provider. Document Released: 01/29/2015 Document Revised: 09/22/2015 Document Reviewed: 11/03/2014 Elsevier Interactive Patient Education  2017 [place] Prevention in the Home Falls can cause injuries. They can happen to people of all ages. There are many things you can do to make your home safe and to help prevent falls. What can I do on the outside of my home? Regularly fix the edges of walkways and driveways and fix any cracks. Remove anything that might make you trip as you walk through a door, such as a raised step or threshold. Trim any bushes or trees on the path to your home. Use bright outdoor lighting. Clear any walking paths of anything that might make someone trip, such as rocks or tools. Regularly check to see if handrails are loose or broken. Make sure that both sides of any steps have handrails. Any raised decks and porches should have guardrails on the edges. Have any leaves, snow, or ice cleared regularly. Use sand or salt on walking paths during winter. Clean up any spills in your garage right away. This includes oil or grease spills. What can I do in the bathroom? Use night lights. Install grab bars by the toilet and in the tub and shower. Do not use towel bars as grab bars. Use non-skid mats or decals in the tub or shower. If you need to sit down in the shower, use a plastic, non-slip stool. Keep the floor dry. Clean up any water that spills on the floor as soon as it happens. Remove soap buildup in the tub or shower regularly. Attach bath mats securely with double-sided non-slip rug tape. Do not have throw rugs and other things on the floor that can make you trip. What can I do in the bedroom? Use night lights. Make sure that you have a light by your bed that is easy to  reach. Do not use any sheets or blankets that are too big for your bed. They should not hang down onto the floor. Have a firm chair that has side arms. You can use this for support while you get dressed. Do not have throw rugs and other things on the floor that can make you trip. What can I do in the kitchen? Clean up any spills right away. Avoid walking on wet floors. Keep items that you use a lot in easy-to-reach places. If you need to reach something above you, use a strong step stool that has a grab bar. Keep electrical cords out of the way. Do not use floor polish or wax that makes floors slippery. If you must use wax, use non-skid floor wax. Do not have throw rugs and other things on the floor that can make you trip. What can I do with my stairs? Do not leave any items on the stairs. Make sure that there are handrails on both sides of the stairs and use them. Fix handrails that are broken or loose. Make sure that handrails are as long as the stairways. Check any carpeting to make sure that it is firmly attached to the stairs. Fix any carpet that is loose or worn. Avoid having throw rugs at the top or bottom of the stairs. If you do have throw rugs, attach them to the floor with  carpet tape. Make sure that you have a light switch at the top of the stairs and the bottom of the stairs. If you do not have them, ask someone to add them for you. What else can I do to help prevent falls? Wear shoes that: Do not have high heels. Have rubber bottoms. Are comfortable and fit you well. Are closed at the toe. Do not wear sandals. If you use a stepladder: Make sure that it is fully opened. Do not climb a closed stepladder. Make sure that both sides of the stepladder are locked into place. Ask someone to hold it for you, if possible. Clearly mark and make sure that you can see: Any grab bars or handrails. First and last steps. Where the edge of each step is. Use tools that help you move  around (mobility aids) if they are needed. These include: Canes. Walkers. Scooters. Crutches. Turn on the lights when you go into a dark area. Replace any light bulbs as soon as they burn out. Set up your furniture so you have a clear path. Avoid moving your furniture around. If any of your floors are uneven, fix them. If there are any pets around you, be aware of where they are. Review your medicines with your doctor. Some medicines can make you feel dizzy. This can increase your chance of falling. Ask your doctor what other things that you can do to help prevent falls. This information is not intended to replace advice given to you by your health care provider. Make sure you discuss any questions you have with your health care provider. Document Released: 10/29/2008 Document Revised: 06/10/2015 Document Reviewed: 02/06/2014 Elsevier Interactive Patient Education  2017 [employer].

## 2021-02-16 ENCOUNTER — Ambulatory Visit
Admission: RE | Admit: 2021-02-16 | Discharge: 2021-02-16 | Disposition: A | Discharge status: Home / Self Care | Payer: Medicare Other | Source: Ambulatory Visit | Attending: Student in an Organized Health Care Education/Training Program | Admitting: Student in an Organized Health Care Education/Training Program

## 2021-02-16 ENCOUNTER — Other Ambulatory Visit: Payer: Self-pay

## 2021-02-16 ENCOUNTER — Ambulatory Visit (HOSPITAL_BASED_OUTPATIENT_CLINIC_OR_DEPARTMENT_OTHER): Payer: Medicare Other | Admitting: Student in an Organized Health Care Education/Training Program

## 2021-02-16 ENCOUNTER — Encounter: Payer: Self-pay | Admitting: Student in an Organized Health Care Education/Training Program

## 2021-02-16 DIAGNOSIS — G894 Chronic pain syndrome: Secondary | ICD-10-CM

## 2021-02-16 DIAGNOSIS — M47816 Spondylosis without myelopathy or radiculopathy, lumbar region: Secondary | ICD-10-CM

## 2021-02-16 MED ORDER — LIDOCAINE HCL 2 % IJ SOLN
INTRAMUSCULAR | Status: AC
  Filled 2021-02-16: qty 20

## 2021-02-16 MED ORDER — LIDOCAINE HCL 2 % IJ SOLN
20.0000 mL | Freq: Once | INTRAMUSCULAR | Status: AC
  Administered 2021-02-16: 400 mg

## 2021-02-16 MED ORDER — DEXAMETHASONE SODIUM PHOSPHATE 10 MG/ML IJ SOLN
10.0000 mg | Freq: Once | INTRAMUSCULAR | Status: AC
  Administered 2021-02-16: 10 mg

## 2021-02-16 MED ORDER — MIDAZOLAM HCL 5 MG/5ML IJ SOLN
INTRAMUSCULAR | Status: AC
  Filled 2021-02-16: qty 5

## 2021-02-16 MED ORDER — ROPIVACAINE HCL 2 MG/ML IJ SOLN
9.0000 mL | Freq: Once | INTRAMUSCULAR | Status: AC
  Administered 2021-02-16: 9 mL via PERINEURAL

## 2021-02-16 MED ORDER — DEXAMETHASONE SODIUM PHOSPHATE 10 MG/ML IJ SOLN
INTRAMUSCULAR | Status: AC
  Filled 2021-02-16: qty 1

## 2021-02-16 MED ORDER — MIDAZOLAM HCL 5 MG/5ML IJ SOLN
0.5000 mg | Freq: Once | INTRAMUSCULAR | Status: AC
  Administered 2021-02-16: 2 mg via INTRAVENOUS

## 2021-02-16 NOTE — Progress Notes (Signed)
PROVIDER NOTE: Interpretation of information contained herein should be left to medically-trained personnel. Specific patient instructions are provided elsewhere under "Patient Instructions" section of medical record. This document was created in part using STT-dictation technology, any transcriptional errors that may result from this process are unintentional.  Patient: [name] Type: Established DOB: 19-May-1955 MRN: [id number] PCP: Olin Hauser, DO  Service: Procedure DOS: 02/16/2021 Setting: Ambulatory Location: Ambulatory outpatient facility Delivery: Face-to-face Provider: Gillis Santa, MD Specialty: Interventional Pain Management Specialty designation: 09 Location: Outpatient facility Ref. Prov.: Gillis Santa, MD    Primary Reason for Visit: Interventional Pain Management Treatment. CC: Back Pain (L>R)  Procedure:           Type: Lumbar Facet, Medial Branch Radiofrequency Ablation (RFA) #1  Laterality: Left  Level: L3, L4, L5, Medial Branch Level(s). These levels will denervate the L3-4 and L5-S1 lumbar facet joints.  Imaging: Fluoroscopic guidance Anesthesia: Local anesthesia (1-2% Lidocaine) Anxiolysis: IV Versed 2.0 mg (minimal) DOS: 02/16/2021  Performed by: Gillis Santa, MD  Purpose: Therapeutic/Palliative Indications: Low back pain severe enough to impact quality of life or function. Indications: 1. Lumbar facet joint syndrome   2. Lumbar facet arthropathy   3. Chronic pain syndrome    [name] has been dealing with the above chronic pain for longer than three months and has either failed to respond, was unable to tolerate, or simply did not get enough benefit from other more conservative therapies including, but not limited to: 1. Over-the-counter medications 2. Anti-inflammatory medications 3. Muscle relaxants 4. Membrane stabilizers 5. Opioids 6. Physical therapy and/or chiropractic manipulation 7. Modalities (Heat, ice, etc.) 8. Invasive  techniques such as nerve blocks. [name] has attained more than 50% relief of the pain from a series of diagnostic injections conducted in separate occasions.  Pain Score: Pre-procedure: 5 /10 Post-procedure: 6 /10     Patient discontinued his Xarelto 3 days prior.  Position / Prep / Materials:  Position: Prone  Prep solution: DuraPrep (Iodine Povacrylex [0.7% available iodine] and Isopropyl Alcohol, 74% w/w) Prep Area: Entire Lumbosacral Region (Lower back from mid-thoracic region to end of tailbone and from flank to flank.) Materials:  Tray: RFA (Radiofrequency) tray Needle(s):  Type: RFA (Teflon-coated radiofrequency ablation needles) Gauge (G): 22  Length: Long (15cm) Qty: 3  Pre-op H&P Assessment:  [name] is a 66 y.o. (year old), male patient, seen today for interventional treatment. He  has a past surgical history that includes Knee surgery; Gastric bypass (2002); prostate seeding; Carpal tunnel release (Right, 12/23/2014); Carpal tunnel release (Left, 01/06/2015); Colonoscopy with propofol (N/A, 10/08/2017); and Cardioversion (N/A, 05/15/2019). [name] has a current medication list which includes the following prescription(s): acetaminophen, amiodarone, benazepril, cyanocobalamin, cyclobenzaprine, furosemide, gabapentin, hydrocodone-acetaminophen, [START ON 03/03/2021] hydrocodone-acetaminophen, ibuprofen, metoprolol succinate, multivitamin with minerals, trazodone, ezetimibe, fluticasone, rivaroxaban, rosuvastatin, and shingrix. His primarily concern today is the Back Pain (L>R)  Initial Vital Signs:  Pulse/HCG Rate:  (!) 59  Temp:  (!) 97 F (36.1 C) Resp: 18 BP:  (!) 170/95 SpO2: 99 %  BMI: Estimated body mass index is 46.93 kg/m as calculated from the following:   Height as of this encounter: 6' (1.829 m).   Weight as of this encounter: 346 lb (156.9 kg).  Risk Assessment: Allergies: Reviewed. He has No Known Allergies.  Allergy Precautions: None  required Coagulopathies: Reviewed. None identified.  Blood-thinner therapy: None at this time Active Infection(s): Reviewed. None identified. [name] is afebrile  Site Confirmation: [name] was asked to  confirm the procedure and laterality before marking the site Procedure checklist: Completed Consent: Before the procedure and under the influence of no sedative(s), amnesic(s), or anxiolytics, the patient was informed of the treatment options, risks and possible complications. To fulfill our ethical and legal obligations, as recommended by the American Medical Association's Code of Ethics, I have informed the patient of my clinical impression; the nature and purpose of the treatment or procedure; the risks, benefits, and possible complications of the intervention; the alternatives, including doing nothing; the risk(s) and benefit(s) of the alternative treatment(s) or procedure(s); and the risk(s) and benefit(s) of doing nothing. The patient was provided information about the general risks and possible complications associated with the procedure. These may include, but are not limited to: failure to achieve desired goals, infection, bleeding, organ or nerve damage, allergic reactions, paralysis, and death. In addition, the patient was informed of those risks and complications associated to Spine-related procedures, such as failure to decrease pain; infection (i.e.: Meningitis, epidural or intraspinal abscess); bleeding (i.e.: epidural hematoma, subarachnoid hemorrhage, or any other type of intraspinal or peri-dural bleeding); organ or nerve damage (i.e.: Any type of peripheral nerve, nerve root, or spinal cord injury) with subsequent damage to sensory, motor, and/or autonomic systems, resulting in permanent pain, numbness, and/or weakness of one or several areas of the body; allergic reactions; (i.e.: anaphylactic reaction); and/or death. Furthermore, the patient was informed of those risks and  complications associated with the medications. These include, but are not limited to: allergic reactions (i.e.: anaphylactic or anaphylactoid reaction(s)); adrenal axis suppression; blood sugar elevation that in diabetics may result in ketoacidosis or comma; water retention that in patients with history of congestive heart failure may result in shortness of breath, pulmonary edema, and decompensation with resultant heart failure; weight gain; swelling or edema; medication-induced neural toxicity; particulate matter embolism and blood vessel occlusion with resultant organ, and/or nervous system infarction; and/or aseptic necrosis of one or more joints. Finally, the patient was informed that Medicine is not an exact science; therefore, there is also the possibility of unforeseen or unpredictable risks and/or possible complications that may result in a catastrophic outcome. The patient indicated having understood very clearly. We have given the patient no guarantees and we have made no promises. Enough time was given to the patient to ask questions, all of which were answered to the patient's satisfaction. Mr. Hetz has indicated that he wanted to continue with the procedure. Attestation: I, the ordering provider, attest that I have discussed with the patient the benefits, risks, side-effects, alternatives, likelihood of achieving goals, and potential problems during recovery for the procedure that I have provided informed consent. Date   Time: 02/16/2021  9:45 AM  Pre-Procedure Preparation:  Monitoring: As per clinic protocol. Respiration, ETCO2, SpO2, BP, heart rate and rhythm monitor placed and checked for adequate function Safety Precautions: Patient was assessed for positional comfort and pressure points before starting the procedure. Time-out: I initiated and conducted the "Time-out" before starting the procedure, as per protocol. The patient was asked to participate by confirming the accuracy of the "Time  Out" information. Verification of the correct person, site, and procedure were performed and confirmed by me, the nursing staff, and the patient. "Time-out" conducted as per Joint Commission's Universal Protocol (UP.01.01.01). Time: 1018  Description of Procedure:          Laterality: Left Levels:  L3, L4, L5, Medial Branch Level(s), at the L3-4 and L5-S1 lumbar facet joints. Safety Precautions: Aspiration looking for blood return  was conducted prior to all injections. At no point did we inject any substances, as a needle was being advanced. Before injecting, the patient was told to immediately notify me if he was experiencing any new onset of "ringing in the ears, or metallic taste in the mouth". No attempts were made at seeking any paresthesias. Safe injection practices and needle disposal techniques used. Medications properly checked for expiration dates. SDV (single dose vial) medications used. After the completion of the procedure, all disposable equipment used was discarded in the proper designated medical waste containers. Local Anesthesia: Protocol guidelines were followed. The patient was positioned over the fluoroscopy table. The area was prepped in the usual manner. The time-out was completed. The target area was identified using fluoroscopy. A 12-in long, straight, sterile hemostat was used with fluoroscopic guidance to locate the targets for each level blocked. Once located, the skin was marked with an approved surgical skin marker. Once all sites were marked, the skin (epidermis, dermis, and hypodermis), as well as deeper tissues (fat, connective tissue and muscle) were infiltrated with a small amount of a short-acting local anesthetic, loaded on a 10cc syringe with a 25G, 1.5-in  Needle. An appropriate amount of time was allowed for local anesthetics to take effect before proceeding to the next step. Technical description of process:   Radiofrequency Ablation (RFA) L3 Medial Branch Nerve  RFA: The target area for the L3 medial branch is at the junction of the postero-lateral aspect of the superior articular process and the superior, posterior, and medial edge of the transverse process of L4. Under fluoroscopic guidance, a Radiofrequency needle was inserted until contact was made with os over the superior postero-lateral aspect of the pedicular shadow (target area). Sensory and motor testing was conducted to properly adjust the position of the needle. Once satisfactory placement of the needle was achieved, the numbing solution was slowly injected after negative aspiration for blood. 2.0 mL of the nerve block solution was injected without difficulty or complication. After waiting for at least 3 minutes, the ablation was performed. Once completed, the needle was removed intact. L4 Medial Branch Nerve RFA: The target area for the L4 medial branch is at the junction of the postero-lateral aspect of the superior articular process and the superior, posterior, and medial edge of the transverse process of L5. Under fluoroscopic guidance, a Radiofrequency needle was inserted until contact was made with os over the superior postero-lateral aspect of the pedicular shadow (target area). Sensory and motor testing was conducted to properly adjust the position of the needle. Once satisfactory placement of the needle was achieved, the numbing solution was slowly injected after negative aspiration for blood. 2.0 mL of the nerve block solution was injected without difficulty or complication. After waiting for at least 3 minutes, the ablation was performed. Once completed, the needle was removed intact. L5 Medial Branch Nerve RFA: The target area for the L5 medial branch is at the junction of the postero-lateral aspect of the superior articular process of S1 and the superior, posterior, and medial edge of the sacral ala. Under fluoroscopic guidance, a Radiofrequency needle was inserted until contact was made with os  over the superior postero-lateral aspect of the pedicular shadow (target area). Sensory and motor testing was conducted to properly adjust the position of the needle. Once satisfactory placement of the needle was achieved, the numbing solution was slowly injected after negative aspiration for blood. 2.0 mL of the nerve block solution was injected without difficulty or complication. After waiting  for at least 3 minutes, the ablation was performed. Once completed, the needle was removed intact  Radiofrequency lesioning (ablation):  Radiofrequency Generator: Medtronic AccurianTM AG 1000 RF Generator Sensory Stimulation Parameters: 50 Hz was used to locate & identify the nerve, making sure that the needle was positioned such that there was no sensory stimulation below 0.3 V or above 0.7 V. Motor Stimulation Parameters: 2 Hz was used to evaluate the motor component. Care was taken not to lesion any nerves that demonstrated motor stimulation of the lower extremities at an output of less than 2.5 times that of the sensory threshold, or a maximum of 2.0 V. Lesioning Technique Parameters: Standard Radiofrequency settings. (Not bipolar or pulsed.) Temperature Settings: 80 degrees C Lesioning time: 60 seconds Intra-operative Compliance: Compliant  6 cc solution made of 5 cc of 0.2% ropivacaine, 1 cc of Decadron 10 mg/cc.  2 cc injected at each level above on the left after sensorimotor testing, prior to lesioning.  Once the entire procedure was completed, the treated area was cleaned, making sure to leave some of the prepping solution back to take advantage of its long term bactericidal properties.    Illustration of the posterior view of the lumbar spine and the posterior neural structures. Laminae of L2 through S1 are labeled. DPRL5, dorsal primary ramus of L5; DPRS1, dorsal primary ramus of S1; DPR3, dorsal primary ramus of L3; FJ, facet (zygapophyseal) joint L3-L4; I, inferior articular process of L4; LB1,  lateral branch of dorsal primary ramus of L1; IAB, inferior articular branches from L3 medial branch (supplies L4-L5 facet joint); IBP, intermediate branch plexus; MB3, medial branch of dorsal primary ramus of L3; NR3, third lumbar nerve root; S, superior articular process of L5; SAB, superior articular branches from L4 (supplies L4-5 facet joint also); TP3, transverse process of L3.  Vitals:   02/16/21 1033 02/16/21 1038 02/16/21 1043 02/16/21 1052  BP: 139/83 (!) 147/95 (!) 135/93 140/83  Pulse: (!) 56 (!) 55 (!) 57 (!) 52  Resp: (!) 22 17 18 18   Temp:      TempSrc:      SpO2: 95% 96% 97% 97%  Weight:      Height:       Start Time: 1018 hrs. End Time: 1043 hrs.  Imaging Guidance (Spinal):          Type of Imaging Technique: Fluoroscopy Guidance (Spinal) Indication(s): Assistance in needle guidance and placement for procedures requiring needle placement in or near specific anatomical locations not easily accessible without such assistance. Exposure Time: Please see nurses notes. Contrast: None used. Fluoroscopic Guidance: I was personally present during the use of fluoroscopy. "Tunnel Vision Technique" used to obtain the best possible view of the target area. Parallax error corrected before commencing the procedure. "Direction-depth-direction" technique used to introduce the needle under continuous pulsed fluoroscopy. Once target was reached, antero-posterior, oblique, and lateral fluoroscopic projection used confirm needle placement in all planes. Images permanently stored in EMR. Interpretation: No contrast injected. I personally interpreted the imaging intraoperatively. Adequate needle placement confirmed in multiple planes. Permanent images saved into the patient's record.  Post-operative Assessment:  Post-procedure Vital Signs:  Pulse/HCG Rate:  (!) 52  Temp:  (!) 97 F (36.1 C) Resp: 18 BP: 140/83 SpO2: 97 %  EBL: None  Complications: No immediate post-treatment  complications observed by team, or reported by patient.  Note: The patient tolerated the entire procedure well. A repeat set of vitals were taken after the procedure and the patient was kept  under observation following institutional policy, for this type of procedure. Post-procedural neurological assessment was performed, showing return to baseline, prior to discharge. The patient was provided with post-procedure discharge instructions, including a section on how to identify potential problems. Should any problems arise concerning this procedure, the patient was given instructions to immediately contact us, at any time, without hesitation. In any case, we plan to contact the patient by telephone for a follow-up status report regarding this interventional procedure.  Comments:  No additional relevant information.  Plan of Care  Orders:  Orders Placed This Encounter  Procedures   Radiofrequency,Lumbar    Standing Status:   Future    Standing Expiration Date:   08/16/2021    Scheduling Instructions:     Side(s): RIGHT     Level(s): L3,4 5 Medial Branch Nerve(s)     Sedation: With IV Versed     Scheduling Timeframe: 2 weeks    Order Specific Question:   Where will this procedure be performed?    Answer:   [facility]   DG [facility] C-ARM 1-60 MIN NO REPORT    Intraoperative interpretation by procedural physician at [place].    Standing Status:   Standing    Number of Occurrences:   1    Order Specific Question:   Reason for exam:    Answer:   Assistance in needle guidance and placement for procedures requiring needle placement in or near specific anatomical locations not easily accessible without such assistance.   Chronic Opioid Analgesic:  Tramadol 50 mg 4 times daily as needed, quantity 120/month; MME equals 20    Medications ordered for procedure: Meds ordered this encounter  Medications   lidocaine (XYLOCAINE) 2 % (with pres) injection 400 mg   midazolam  (VERSED) 5 MG/5ML injection 0.5-2 mg    Make sure Flumazenil is available in the pyxis when using this medication. If oversedation occurs, administer 0.2 mg IV over 15 sec. If after 45 sec no response, administer 0.2 mg again over 1 min; may repeat at 1 min intervals; not to exceed 4 doses (1 mg)   dexamethasone (DECADRON) injection 10 mg   ropivacaine (PF) 2 mg/mL (0.2%) (NAROPIN) injection 9 mL   Medications administered: We administered lidocaine, midazolam, dexamethasone, and ropivacaine (PF) 2 mg/mL (0.2%).  See the medical record for exact dosing, route, and time of administration.  Follow-up plan:   Return in about 2 weeks (around 03/02/2021) for R L3,4,5 RFA (IV Versed in clinic).       s/p lesi #1 (left L4/5) on 8/17, #2 on 10/21/2018 (left L3/4), #3 on 12/09/2018 (Left L3/4); 02/24/2019: Right wrist injection and left trigger finger (middle finger) injection.,  Bilateral wrist injection 10/15/2019, bilateral knee Hyalgan No. 1 01/26/1960. #2 02/23/2020, #3 03/24/2020.  Endorsing approximately 75 to 80% pain relief for bilateral knee pain.          Recent Visits Date Type Provider Dept  02/01/21 Office Visit Gillis Santa, MD [facility]  01/05/21 Office Visit Gillis Santa, MD [facility]  12/15/20 Procedure visit Gillis Santa, MD [facility]  12/01/20 Procedure visit Gillis Santa, MD [facility]  Showing recent visits within past 90 days and meeting all other requirements Today's Visits Date Type Provider Dept  02/16/21 Procedure visit Gillis Santa, MD [facility]  Showing today's visits and meeting all other requirements Future Appointments Date Type Provider Dept  03/29/21 Appointment Gillis Santa, MD [facility]  Showing  future appointments within next 90 days and meeting all other requirements  Disposition: Discharge home  Discharge (Date   Time): 02/16/2021; 1100 hrs.   Primary Care Physician: Olin Hauser, DO Location: [facility] Note by: Gillis Santa, MD Date: 02/16/2021; Time: 11:22 AM  Disclaimer:  Medicine is not an exact science. The only guarantee in medicine is that nothing is guaranteed. It is important to note that the decision to proceed with this intervention was based on the information collected from the patient. The Data and conclusions were drawn from the patient's questionnaire, the interview, and the physical examination. Because the information was provided in large part by the patient, it cannot be guaranteed that it has not been purposely or unconsciously manipulated. Every effort has been made to obtain as much relevant data as possible for this evaluation. It is important to note that the conclusions that lead to this procedure are derived in large part from the available data. Always take into account that the treatment will also be dependent on availability of resources and existing treatment guidelines, considered by other Pain Management Practitioners as being common knowledge and practice, at the time of the intervention. For Medico-Legal purposes, it is also important to point out that variation in procedural techniques and pharmacological choices are the acceptable norm. The indications, contraindications, technique, and results of the above procedure should only be interpreted and judged by a Board-Certified Interventional Pain Specialist with extensive familiarity and expertise in the same exact procedure and technique.

## 2021-02-16 NOTE — Patient Instructions (Addendum)
You may restart your Xarelto tomorrow. ____________________________________________________________________________________________  Post-procedure Information What to expect: Most procedures involve the use of a local anesthetic (numbing medicine), and a steroid (anti-inflammatory medicine).  The local anesthetics may cause temporary numbness and weakness of the legs or arms, depending on the location of the block. This numbness/weakness may last 4-6 hours, depending on the local anesthetic used. In rare instances, it can last up to 24 hours. While numb, you must be very careful not to injure the extremity.  After any procedure, you could expect the pain to get better within 15-20 minutes. This relief is temporary and may last 4-6 hours. Once the local anesthetics wears off, you could experience discomfort, possibly more than usual, for up to 10 (ten) days. In the case of radiofrequencies, it may last up to 6 weeks. Surgeries may take up to 8 weeks for the healing process. The discomfort is due to the irritation caused by needles going through skin and muscle. To minimize the discomfort, we recommend using ice the first day, and heat from then on. The ice should be applied for 15 minutes on, and 15 minutes off. Keep repeating this cycle until bedtime. Avoid applying the ice directly to the skin, to prevent frostbite. Heat should be used daily, until the pain improves (4-10 days). Be careful not to burn yourself.  Occasionally you may experience muscle spasms or cramps. These occur as a consequence of the irritation caused by the needle sticks to the muscle and the blood that will inevitably be lost into the surrounding muscle tissue. Blood tends to be very irritating to tissues, which tend to react by going into spasm. These spasms may start the same day of your procedure, but they may also take days to develop. This late onset type of spasm or cramp is usually caused by electrolyte imbalances triggered  by the steroids, at the level of the kidney. Cramps and spasms tend to respond well to muscle relaxants, multivitamins (some are triggered by the procedure, but may have their origins in vitamin deficiencies), and Gatorade, or any sports drinks that can replenish any electrolyte imbalances. (If you are a diabetic, ask your pharmacist to get you a sugar-free brand.) Warm showers or baths may also be helpful. Stretching exercises are highly recommended.  General Instructions:  Be alert for signs of possible infection: redness, swelling, heat, red streaks, elevated temperature, and/or fever. These typically appear 4 to 6 days after the procedure. Immediately notify your doctor if you experience unusual bleeding, difficulty breathing, or loss of bowel or bladder control. If you experience increased pain, do not increase your pain medicine intake, unless instructed by your pain physician.  Post-Procedure Care:  Be careful in moving about. Muscle spasms in the area of the injection may occur. Applying ice or heat to the area is often helpful. The incidence of spinal headaches after epidural injections ranges between 1.4% and 6%. If you develop a headache that does not seem to respond to conservative therapy, please let your physician know. This can be treated with an epidural blood patch.   Post-procedure numbness or redness is to be expected, however it should average 4 to 6 hours. If numbness and weakness of your extremities begins to develop 4 to 6 hours after your procedure, and is felt to be progressing and worsening, immediately contact your physician.  Diet:  If you experience nausea, do not eat until this sensation goes away. If you had a Stellate Ganglion Block for upper extremity Reflex  Sympathetic Dystrophy, do not eat or drink until your hoarseness goes away. In any case, always start with liquids first and if you tolerate them well, then slowly progress to more solid foods.  Activity:  For  the first 4 to 6 hours after the procedure, use caution in moving about as you may experience numbness and/or weakness. Use caution in cooking, using household electrical appliances, and climbing steps. If you need to reach your Doctor call our office: [phone] (During business hours) or [phone] (After business hours).  Business Hours: Monday-Thursday 8:00 am - 4:00 PM    Fridays: Closed     In case of an emergency: In case of emergency, call 911 or go to the nearest emergency room and have the physician there call us.  Interpretation of Procedure Every nerve block has two components: a diagnostic component, and a treatment component. Unrealistic expectations are the most common causes of perceived failure.  In a perfect world, a single nerve block should be able to completely and permanently eliminate the pain. Sadly, the world is not perfect.  Most pain management nerve blocks are performed using local anesthetics and steroids. Steroids are responsible for any long-term benefit that you may experience. Their purpose is to decrease any chronic swelling that may exist in the area. Steroids begin to work immediately after being injected. However, most patients will not experience any benefits until 5 to 10 days after the injection, when the swelling has come down to the point where they can tell a difference. Steroids will only help if there is swelling to be treated. As such, they can assist with the diagnosis. If effective, they suggest an inflammatory component to the pain, and if ineffective, they rule out inflammation as the main cause or component of the problem. If the problem is one of mechanical compression, you will get no benefit from those steroids.   In the case of local anesthetics, they have a crucial role in the diagnosis of your condition. Most will begin to work within15 to 20 minutes after injection. The duration will depend on the type used (short- vs. Long-acting).  It is of outmost importance that patients keep tract of their pain, after the procedure. To assist with this matter, a Post-procedure Pain Diary is provided. Make sure to complete it and to bring it back to your follow-up appointment.  As long as the patient keeps accurate, detailed records of their symptoms after every procedure, and returns to have those interpreted, every procedure will provide [place] with invaluable information. Even a block that does not provide the patient with any relief, will always provide [place] with information about the mechanism and the origin of the pain. The only time a nerve block can be considered a waste of time is when patients do not keep track of the results, or do not keep their post-procedure appointment.  Reporting the results back to your physician The Pain Score  Pain is a subjective complaint. It cannot be seen, touched, or measured. We depend entirely on the patients report of the pain in order to assess your condition and treatment. To evaluate the pain, we use a pain scale, where 0 means No Pain, and a 10 is the worst possible pain that you can even imagine (i.e. something like been eaten alive by a shark or being torn apart by a lion).   Use the Pain Scale provided. You will frequently be asked to rate your pain. Please be accurate, remember that medical  decisions will be based on your responses. Please do not rate your pain above a 10. Doing so is actually interpreted as symptom magnification (exaggeration). To put this into perspective, when you tell us that your pain is at a 10 (ten), what you are saying is that there is nothing we can do to make this pain any worse. (Carefully think about that.) ____________________________________________________________________________________________  ______________________________________________________________________  Preparing for Procedure with Sedation  NOTICE: Due to recent regulatory changes, starting  on August 16, 2020, procedures requiring intravenous (IV) sedation will no longer be performed at the [facility].  These types of procedures are required to be performed at [facility] ambulatory surgery facility.  We are very sorry for the inconvenience.  Procedure appointments are limited to planned procedures: No Prescription Refills. No disability issues will be discussed. No medication changes will be discussed.  Instructions: Oral Intake: Do not eat or drink anything for at least 8 hours prior to your procedure. (Exception: Blood Pressure Medication. See below.) Transportation: A driver is required. You may not drive yourself after the procedure. Blood Pressure Medicine: Do not forget to take your blood pressure medicine with a sip of water the morning of the procedure. If your Diastolic (lower reading) is above 100 mmHg, elective cases will be cancelled/rescheduled. Blood thinners: These will need to be stopped for procedures. Notify our staff if you are taking any blood thinners. Depending on which one you take, there will be specific instructions on how and when to stop it. Diabetics on insulin: Notify the staff so that you can be scheduled 1st case in the morning. If your diabetes requires high dose insulin, take only  of your normal insulin dose the morning of the procedure and notify the staff that you have done so. Preventing infections: Shower with an antibacterial soap the morning of your procedure. Build-up your immune system: Take 1000 mg of Vitamin C with every meal (3 times a day) the day prior to your procedure. Antibiotics: Inform the staff if you have a condition or reason that requires you to take antibiotics before dental procedures. Pregnancy: If you are pregnant, call and cancel the procedure. Sickness: If you have a cold, fever, or any active infections, call and cancel the procedure. Arrival: You must be in the facility at least 30 minutes prior to your scheduled  procedure. Children: Do not bring children with you. Dress appropriately: Bring dark clothing that you would not mind if they get stained. Valuables: Do not bring any jewelry or valuables.  Reasons to call and reschedule or cancel your procedure: (Following these recommendations will minimize the risk of a serious complication.) Surgeries: Avoid having procedures within 2 weeks of any surgery. (Avoid for 2 weeks before or after any surgery). Flu Shots: Avoid having procedures within 2 weeks of a flu shots. (Avoid for 2 weeks before or after immunizations). Barium: Avoid having a procedure within 7-10 days after having had a radiological study involving the use of radiological contrast. (Myelograms, Barium swallow or enema study). Heart attacks: Avoid any elective procedures or surgeries for the initial 6 months after a "Myocardial Infarction" (Heart Attack). Blood thinners: It is imperative that you stop these medications before procedures. Let us know if you if you take any blood thinner.  Infection: Avoid procedures during or within two weeks of an infection (including chest colds or gastrointestinal problems). Symptoms associated with infections include: Localized redness, fever, chills, night sweats or profuse sweating, burning sensation when voiding, cough, congestion, stuffiness, runny  nose, sore throat, diarrhea, nausea, vomiting, cold or Flu symptoms, recent or current infections. It is specially important if the infection is over the area that we intend to treat. Heart and lung problems: Symptoms that may suggest an active cardiopulmonary problem include: cough, chest pain, breathing difficulties or shortness of breath, dizziness, ankle swelling, uncontrolled high or unusually low blood pressure, and/or palpitations. If you are experiencing any of these symptoms, cancel your procedure and contact your primary care physician for an evaluation.  Remember:  Regular Business hours are:  Monday  to Thursday 8:00 AM to 4:00 PM  Provider's Schedule: Milinda Pointer, MD:  Procedure days: Tuesday and Thursday 7:30 AM to 4:00 PM  Gillis Santa, MD:  Procedure days: Monday and Wednesday 7:30 AM to 4:00 PM ______________________________________________________________________  Stop Xarelto 3 days before upcoming procedure.

## 2021-02-16 NOTE — Progress Notes (Signed)
Safety precautions to be maintained throughout the outpatient stay will include: orient to surroundings, keep bed in low position, maintain call bell within reach at all times, provide assistance with transfer out of bed and ambulation.  

## 2021-02-17 ENCOUNTER — Telehealth: Payer: Self-pay | Admitting: *Deleted

## 2021-02-17 NOTE — Telephone Encounter (Signed)
States good results with procedure. No issues reported.

## 2021-02-17 NOTE — Telephone Encounter (Signed)
Attempted to call for post procedure follow-up. Message left. 

## 2021-02-21 ENCOUNTER — Ambulatory Visit (INDEPENDENT_AMBULATORY_CARE_PROVIDER_SITE_OTHER): Payer: Medicare Other | Admitting: Family Medicine

## 2021-02-21 ENCOUNTER — Encounter: Payer: Self-pay | Admitting: Family Medicine

## 2021-02-21 ENCOUNTER — Other Ambulatory Visit: Payer: Self-pay

## 2021-02-21 VITALS — BP 147/78 | HR 54 | Ht 72.0 in | Wt 342.0 lb

## 2021-02-21 DIAGNOSIS — G894 Chronic pain syndrome: Secondary | ICD-10-CM

## 2021-02-21 DIAGNOSIS — E782 Mixed hyperlipidemia: Secondary | ICD-10-CM | POA: Diagnosis not present

## 2021-02-21 DIAGNOSIS — G720 Drug-induced myopathy: Secondary | ICD-10-CM

## 2021-02-21 NOTE — Progress Notes (Signed)
Subjective:    Patient ID: [name], male    DOB: [birth year], 66 y.o.   MRN: [id number]  [name] is a 66 y.o. male presenting on 02/21/2021 for Hyperlipidemia   HPI  Generalized Body Pain Every joint in body, aching and causing pain Followed by Dr Holley Raring at [facility] Previously on Tramadol, then switched to Hydrocodone. Now holding for several days. Down to Gabapentin 400mg  daily RF Ablation L side, sciatica has resolved.  Hyperlipidemia Drug induced myopathy He stopped Rosuvastatin 10mg  and Zetia 10mg  on 01/30/21, and has had dramatic improvement in pain overall, in past 3 weeks since stopped meds, has reduced pain medication. Last lab Lipid 07/2020 No other statin therapy  Cardiology Remains on Xarelto, Amiodarone, Benazepril, Metoprolol XL   Depression screen [facility] 2/9 02/21/2021 02/16/2021 02/11/2021  Decreased Interest 0 0 0  Down, Depressed, Hopeless 0 0 0  PHQ - 2 Score 0 0 0  Altered sleeping 0 - -  Tired, decreased energy 0 - -  Change in appetite 0 - -  Feeling bad or failure about yourself  0 - -  Trouble concentrating 0 - -  Moving slowly or fidgety/restless 0 - -  Suicidal thoughts 0 - -  PHQ-9 Score 0 - -  Difficult doing work/chores Not difficult at all - -  Some recent data might be hidden    Social History   Tobacco Use   Smoking status: Former    Packs/day: 1.00    Years: 10.00    Pack years: 10.00    Types: Cigarettes    Quit date: 02/18/1979    Years since quitting: 42.0   Smokeless tobacco: Former    Types: Snuff  Vaping Use   Vaping Use: Never used  Substance Use Topics   Alcohol use: Yes    Alcohol/week: 12.0 standard drinks    Types: 12 Cans of beer per week    Comment: weekly   Drug use: No    Review of Systems Per HPI unless specifically indicated above     Objective:    BP (!) 147/78    Pulse (!) 54    Ht 6' (1.829 m)    Wt (!) 342 lb (155.1 kg)    SpO2 97%    BMI 46.38 kg/m   Wt Readings from Last 3  Encounters:  02/21/21 (!) 342 lb (155.1 kg)  02/16/21 (!) 346 lb (156.9 kg)  02/11/21 (!) 346 lb 8 oz (157.2 kg)    Physical Exam Vitals and nursing note reviewed.  Constitutional:      General: He is not in acute distress.    Appearance: Normal appearance. He is well-developed. He is obese. He is not diaphoretic.     Comments: Well-appearing, comfortable, cooperative  HENT:     Head: Normocephalic and atraumatic.  Eyes:     General:        Right eye: No discharge.        Left eye: No discharge.     Conjunctiva/sclera: Conjunctivae normal.  Cardiovascular:     Rate and Rhythm: Normal rate.  Pulmonary:     Effort: Pulmonary effort is normal.  Skin:    General: Skin is warm and dry.     Findings: No erythema or rash.  Neurological:     Mental Status: He is alert and oriented to person, place, and time.  Psychiatric:        Mood and Affect: Mood normal.  Behavior: Behavior normal.        Thought Content: Thought content normal.     Comments: Well groomed, good eye contact, normal speech and thoughts   Results for orders placed or performed in visit on 02/01/21  Magnesium  Result Value Ref Range   Magnesium 2.0 1.6 - 2.3 mg/dL  Vitamin B12  Result Value Ref Range   Vitamin B-12 509 232 - 1,245 pg/mL  25-Hydroxy vitamin D Lcms D2+D3  Result Value Ref Range   25-Hydroxy, Vitamin D 32 ng/mL   25-Hydroxy, Vitamin D-2 <1.0 ng/mL   25-Hydroxy, Vitamin D-3 32 ng/mL      Assessment & Plan:   Problem List Items Addressed This Visit     Mixed hyperlipidemia - Primary   Drug-induced myopathy   Chronic pain syndrome    Due for repeat lipids, now off Statin/Zetia since mid January 2023, too early to check 3 weeks today off meds, will plan on repeat lab in summer during yearly check w/ labs already as planned  Drug induced myopathy due to statin, dramatic resolution since discontinued. . - Discussed future potential options w/ cholesterol may warrant switch of statin,  intermittent dosing, and reconsider zetia only, vs future PCSK9 injectable medication, these may be only available w/ step therapy. However, based on his history - prior LDL levels, hopefully he could avoid this if improved lifestyle, wt loss all help maintain and control his cholesterol without other therapy.  No orders of the defined types were placed in this encounter.     Follow up plan: Return for keep apts in July 2023.  Future labs already ordered for July 2023  Nobie Putnam, [facility] 02/21/2021, 9:56 AM

## 2021-02-21 NOTE — Patient Instructions (Addendum)
Thank you for coming to the office today.  Keep up the great work.  DUE for FASTING BLOOD WORK (no food or drink after midnight before the lab appointment, only water or coffee without cream/sugar on the morning of)  SCHEDULE "Lab Only" visit in the morning at the clinic for lab draw in 5 MONTHS   - Make sure Lab Only appointment is at about 1 week before your next appointment, so that results will be available  For Lab Results, once available within 2-3 days of blood draw, you can can log in to MyChart online to view your results and a brief explanation. Also, we can discuss results at next follow-up visit.   Please schedule a Follow-up Appointment to: Return for keep apts in July 2023.  If you have any other questions or concerns, please feel free to call the office or send a message through [place]. You may also schedule an earlier appointment if necessary.  Additionally, you may be receiving a survey about your experience at our office within a few days to 1 week by e-mail or mail. We value your feedback.  Nobie Putnam, DO [place]

## 2021-02-28 ENCOUNTER — Encounter: Payer: Self-pay | Admitting: Cardiovascular Disease
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
## 2021-03-01 ENCOUNTER — Encounter: Payer: Self-pay | Admitting: Family Medicine

## 2021-03-01 ENCOUNTER — Other Ambulatory Visit: Payer: Self-pay

## 2021-03-01 ENCOUNTER — Ambulatory Visit (INDEPENDENT_AMBULATORY_CARE_PROVIDER_SITE_OTHER): Payer: Medicare Other | Admitting: Family Medicine

## 2021-03-01 VITALS — Ht 72.0 in | Wt 342.0 lb

## 2021-03-01 DIAGNOSIS — F5101 Primary insomnia: Secondary | ICD-10-CM | POA: Diagnosis not present

## 2021-03-01 DIAGNOSIS — F419 Anxiety disorder, unspecified: Secondary | ICD-10-CM | POA: Diagnosis not present

## 2021-03-01 MED ORDER — BUSPIRONE HCL 5 MG PO TABS: 5.0000 mg | ORAL_TABLET | Freq: Two times a day (BID) | ORAL | 2 refills | Status: DC | PRN

## 2021-03-01 NOTE — Progress Notes (Signed)
Virtual Visit via Telephone The purpose of this virtual visit is to provide medical care while limiting exposure to the novel coronavirus (COVID19) for both patient and office staff.  Consent was obtained for phone visit:  Yes.   Answered questions that patient had about telehealth interaction:  Yes.   I discussed the limitations, risks, security and privacy concerns of performing an evaluation and management service by telephone. I also discussed with the patient that there may be a patient responsible charge related to this service. The patient expressed understanding and agreed to proceed.  Patient Location: Home Provider Location: [facility] (Office)  Participants in virtual visit: - Patient: [name] - CMA: Orinda Kenner, CMA - Provider: Dr Parks Ranger  ---------------------------------------------------------------------- Chief Complaint  Patient presents with   Insomnia   Anxiety    S: Reviewed CMA documentation. I have called patient and gathered additional HPI as follows:   Chronic Insomnia Anxiety/stress  Long term problem, but has had worse issue in past several weeks to month with difficulty sleeping. Now he is only sleeping a few hours at night. Describes problem with sleep onset insomnia. He describes pain discomfort at night keeping him awake due to pain could not get comfortable and also mind active with anxiety.  Trazodone ineffective up to 150mg  dose, previously improved but now it does not help, he still takes Trazodone 150mg  QHS also takes Tylenol PM  Previously on Mirtazapine, Quetiapine, Belsomra  He takes Ibuprofen OTC NSAID PRN some relief. Tried Melatonin  His pain continues to improve following taper down and off pain meds and with RF Ablation.  Reduced coffee down to 1 cup daily, he dramatically reduced. He does sleep hygiene tactics as well  Med changes recently, has come off Gabapentin completely. He tapered down, none  since Sunday. He says the sleep was not great even when he was taking Gabapentin. - He has been off Tramadol 02/01/21 since that time, and he had Hydrocodone PRN but not taking often. - Has been on minimal pain medicine   Past Medical History:  Diagnosis Date   Cardiomyopathy (in setting of Afib)    a. 12/2013 Echo: EF 45-50%, mild ant and antsept HK. mild MR. Mod dil LA. nl RV fxn. Rhythm was Afib.   DJD (degenerative joint disease) of knee    History of kidney stones    Intervertebral disc disorder with radiculopathy of lumbosacral region    Kidney stone 2012   Lower extremity edema    PAF (paroxysmal atrial fibrillation) (HCC)    Pernicious anemia    Prostate cancer ([place])    Pulmonary nodule, right    a. 10/2014 Cardiac CTA: 8mm RLL nodule; b. 04/2015 CT Chest: stable 28mm RLL nodule. No new nodules; 02/2017 CTA Chest: stable, benign, 63mm RLL pulm nodule.   SVT (supraventricular tachycardia) (HCC)    Social History   Tobacco Use   Smoking status: Former    Packs/day: 1.00    Years: 10.00    Pack years: 10.00    Types: Cigarettes    Quit date: 02/18/1979    Years since quitting: 42.0   Smokeless tobacco: Former    Types: Snuff  Vaping Use   Vaping Use: Never used  Substance Use Topics   Alcohol use: Yes    Alcohol/week: 12.0 standard drinks    Types: 12 Cans of beer per week    Comment: weekly   Drug use: No    Current Outpatient Medications:  acetaminophen (TYLENOL) 500 MG tablet, Take 1,000 mg by mouth every 6 (six) hours as needed for moderate pain or headache., Disp: , Rfl:    amiodarone (PACERONE) 200 MG tablet, Take 1 tablet (200 mg total) by mouth daily., Disp: 90 tablet, Rfl: 3   benazepril (LOTENSIN) 40 MG tablet, TAKE 1 TABLET(40 MG) BY MOUTH DAILY, Disp: 90 tablet, Rfl: 3   busPIRone (BUSPAR) 5 MG tablet, Take 1-2 tablets (5-10 mg total) by mouth 2 (two) times daily as needed (anxiety, insomnia)., Disp: 60 tablet, Rfl: 2   cyanocobalamin (,VITAMIN B-12,) 1000  MCG/ML injection, INJECT 1ML EVERY 30 DAYS AS DIRECTED, Disp: 1 mL, Rfl: PRN   furosemide (LASIX) 20 MG tablet, Take 1 tablet (20 mg total) by mouth daily as needed., Disp: 90 tablet, Rfl: 3   ibuprofen (ADVIL) 200 MG tablet, Take 200 mg by mouth daily., Disp: , Rfl:    metoprolol succinate (TOPROL-XL) 50 MG 24 hr tablet, Take 0.5 tablets (25 mg total) by mouth daily. TAKE 1 TABLET(50 MG) BY MOUTH DAILY WITH OR IMMEDIATELY FOLLOWING A MEAL, Disp: 45 tablet, Rfl: 3   Multiple Vitamin (MULTIVITAMIN WITH MINERALS) TABS tablet, Take 1 tablet by mouth daily., Disp: , Rfl:    rivaroxaban (XARELTO) 20 MG TABS tablet, Take 1 tablet (20 mg total) by mouth daily with supper., Disp: 90 tablet, Rfl: 1   SHINGRIX injection, Inject 0.5 mL into muscle for shingles vaccine. Repeat dose in 2-6 months., Disp: 0.5 mL, Rfl: 1   traZODone (DESYREL) 150 MG tablet, Take 1 tablet (150 mg total) by mouth at bedtime., Disp: 90 tablet, Rfl: 3   fluticasone (FLONASE) 50 MCG/ACT nasal spray, USE TWO SPRAYS IN THE AFFECTED NOSTRIL ONE TIME DAILY (Patient not taking: Reported on 02/11/2021), Disp: 16 mL, Rfl: 0   HYDROcodone-acetaminophen (NORCO) 7.5-325 MG tablet, Take 1 tablet by mouth 3 (three) times daily as needed for severe pain. Must last 30 days (Patient not taking: Reported on 03/01/2021), Disp: 90 tablet, Rfl: 0   [START ON 03/03/2021] HYDROcodone-acetaminophen (NORCO) 7.5-325 MG tablet, Take 1 tablet by mouth 3 (three) times daily as needed for severe pain. Must last 30 days (Patient not taking: Reported on 03/01/2021), Disp: 90 tablet, Rfl: 0  Depression screen [facility] 2/9 03/01/2021 02/21/2021 02/16/2021  Decreased Interest 0 0 0  Down, Depressed, Hopeless 0 0 0  PHQ - 2 Score 0 0 0  Altered sleeping 1 0 -  Tired, decreased energy 0 0 -  Change in appetite 0 0 -  Feeling bad or failure about yourself  0 0 -  Trouble concentrating 0 0 -  Moving slowly or fidgety/restless 0 0 -  Suicidal thoughts 0 0 -  PHQ-9 Score 1 0 -   Difficult doing work/chores Not difficult at all Not difficult at all -  Some recent data might be hidden    GAD 7 : Generalized Anxiety Score 03/01/2021 02/21/2021 08/01/2019  Nervous, Anxious, on Edge 0 0 0  Control/stop worrying 0 0 0  Worry too much - different things 0 0 0  Trouble relaxing 0 0 0  Restless 0 0 0  Easily annoyed or irritable 0 0 0  Afraid - awful might happen 0 0 0  Total GAD 7 Score 0 0 0  Anxiety Difficulty Not difficult at all Not difficult at all Not difficult at all    -------------------------------------------------------------------------- O: No physical exam performed due to remote telephone encounter.  Lab results reviewed.  Recent Results (from the past  2160 hour(s))  Magnesium     Status: None   Collection Time: 02/01/21  3:04 PM  Result Value Ref Range   Magnesium 2.0 1.6 - 2.3 mg/dL  Vitamin B12     Status: None   Collection Time: 02/01/21  3:04 PM  Result Value Ref Range   Vitamin B-12 509 232 - 1,245 pg/mL  25-Hydroxy vitamin D Lcms D2+D3     Status: None   Collection Time: 02/01/21  3:04 PM  Result Value Ref Range   25-Hydroxy, Vitamin D 32 ng/mL    Comment: Reference Range: All Ages: Target levels 30 - 100    25-Hydroxy, Vitamin D-2 <1.0 ng/mL    Comment: This test was developed and its performance characteristics determined by [facility]. It has not been cleared or approved by the Food and Drug Administration.    25-Hydroxy, Vitamin D-3 32 ng/mL    Comment: This test was developed and its performance characteristics determined by [facility]. It has not been cleared or approved by the Food and Drug Administration.     -------------------------------------------------------------------------- A&P:  Problem List Items Addressed This Visit     Insomnia - Primary   Other Visit Diagnoses     Anxiety       Relevant Medications   busPIRone (BUSPAR) 5 MG tablet      Insomnia Mostly secondary to chronic pain and anxiety - seems  sleep onset issue, not maintenance Recent worsening lately off multiple meds and still has pain but it is improving On trazodone 150mg  but it has been ineffective. He may choose to wean down on this as well if not helping.  Start Melatonin OTC 3mg  nightly go up to 6mg  in future.  Start Buspirone for anxiety take 1-2 pills evening as needed or regularly to calm some anxiety  Other options we can consider in future if need to try next would be Lexapro daily for controlling anxiety. And also Hydroxyzine as an option PRN QHS for sleep.  May reconsider other pain management strategy to help make him more comfortable in bed as well. Now he is off gabapentin, pain pills, flexeril  Meds ordered this encounter  Medications   busPIRone (BUSPAR) 5 MG tablet    Sig: Take 1-2 tablets (5-10 mg total) by mouth 2 (two) times daily as needed (anxiety, insomnia).    Dispense:  60 tablet    Refill:  2    Follow-up: PRN  Patient verbalizes understanding with the above medical recommendations including the limitation of remote medical advice.  Specific follow-up and call-back criteria were given for patient to follow-up or seek medical care more urgently if needed.   - Time spent in direct consultation with patient on phone: 24 minutes   Nobie Putnam, [employer] 03/01/2021, 11:28 AM

## 2021-03-01 NOTE — Patient Instructions (Addendum)
Thank you for coming to the office today.  May start Melatonin supplement 3mg  daily 30 min before bed, can gradually go up to 2 pills or 6 mg if need  Start Buspar 5mg  tabs take 1-2 pills in evening to calm anxiety and help rest. Can take as needed  Sleep Hygiene Recommendations to promote healthy sleep in all patients, especially if symptoms of insomnia are worsening. Due to the nature of sleep rhythms, if your body gets "out of rhythm", it may take some time before your sleep cycle can be "reset".  Please try to follow as many of the following tips as you can, usually there are only a few of these are the primary cause of the problem.  ?To reset your sleep rhythm, go to bed and get up at the same time every day ?Sleep only long enough to feel rested and then get out of bed ?Do not try to force yourself to sleep. If you can't sleep, get out of bed and try again later. ?Avoid naps during the day, unless excessively tired. The more sleeping during the day, then the less sleep your body needs at night.  ?Have coffee, tea, and other foods that have caffeine only in the morning ?Exercise several days a week, but not right before bed ?If you drink alcohol, prefer to have appropriate drink with one meal, but prefer to avoid alcohol in the evening, and bedtime ?If you smoke, avoid smoking, especially in the evening  ?Avoid watching TV or looking at phones, computers, or reading devices ("e-books") that give off light at least 30 minutes before bed. This artificial light sends "awake signals" to your brain and can make it harder to fall asleep. ?Make your bedroom a comfortable place where it is easy to fall asleep: Put up shades or special blackout curtains to block light from outside. Use a white noise machine to block noise. Keep the temperature cool. ?Try your best to solve or at least address your problems before you go to bed ?Use relaxation techniques to manage stress. Ask your health care  provider to suggest some techniques that may work well for you. These may include: Breathing exercises. Routines to release muscle tension. Visualizing peaceful scenes.   Please schedule a Follow-up Appointment to: No follow-ups on file.  If you have any other questions or concerns, please feel free to call the office or send a message through [place]. You may also schedule an earlier appointment if necessary.  Additionally, you may be receiving a survey about your experience at our office within a few days to 1 week by e-mail or mail. We value your feedback.  Nobie Putnam, DO [place]

## 2021-03-02 ENCOUNTER — Ambulatory Visit: Payer: Medicare Other | Admitting: Physician Assistant

## 2021-03-04 ENCOUNTER — Other Ambulatory Visit
Admission: RE | Admit: 2021-03-04 | Discharge: 2021-03-04 | Disposition: A | Discharge status: Home / Self Care | Payer: Medicare Other | Attending: Ophthalmology | Admitting: Ophthalmology

## 2021-03-04 DIAGNOSIS — H5789 Other specified disorders of eye and adnexa: Secondary | ICD-10-CM | POA: Insufficient documentation

## 2021-03-04 DIAGNOSIS — M3501 Sicca syndrome with keratoconjunctivitis: Secondary | ICD-10-CM | POA: Diagnosis not present

## 2021-03-04 LAB — TSH: TSH: 1.462 u[IU]/mL (ref 0.350–4.500)

## 2021-03-04 LAB — T4, FREE: Free T4: 1.19 ng/dL — ABNORMAL HIGH (ref 0.61–1.12)

## 2021-03-05 LAB — THYROID PANEL
Free Thyroxine Index: 2.8 (ref 1.2–4.9)
T3 Uptake Ratio: 30 % (ref 24–39)
T4, Total: 9.3 ug/dL (ref 4.5–12.0)

## 2021-03-05 LAB — T3: T3, Total: 95 ng/dL (ref 71–180)

## 2021-03-09 ENCOUNTER — Ambulatory Visit: Payer: Medicare Other | Admitting: Student in an Organized Health Care Education/Training Program

## 2021-03-09 ENCOUNTER — Other Ambulatory Visit: Payer: Self-pay

## 2021-03-09 ENCOUNTER — Encounter: Payer: Self-pay | Admitting: Student in an Organized Health Care Education/Training Program

## 2021-03-09 ENCOUNTER — Ambulatory Visit
Admission: RE | Admit: 2021-03-09 | Discharge: 2021-03-09 | Disposition: A | Discharge status: Home / Self Care | Payer: Medicare Other | Source: Ambulatory Visit | Attending: Student in an Organized Health Care Education/Training Program | Admitting: Student in an Organized Health Care Education/Training Program

## 2021-03-09 DIAGNOSIS — M47816 Spondylosis without myelopathy or radiculopathy, lumbar region: Secondary | ICD-10-CM | POA: Diagnosis not present

## 2021-03-09 DIAGNOSIS — G894 Chronic pain syndrome: Secondary | ICD-10-CM

## 2021-03-09 MED ORDER — LIDOCAINE HCL 2 % IJ SOLN
INTRAMUSCULAR | Status: AC
  Filled 2021-03-09: qty 20

## 2021-03-09 MED ORDER — ROPIVACAINE HCL 2 MG/ML IJ SOLN
9.0000 mL | Freq: Once | INTRAMUSCULAR | Status: AC
  Administered 2021-03-09: 20 mL via PERINEURAL

## 2021-03-09 MED ORDER — DEXAMETHASONE SODIUM PHOSPHATE 10 MG/ML IJ SOLN
10.0000 mg | Freq: Once | INTRAMUSCULAR | Status: AC
  Administered 2021-03-09: 10 mg

## 2021-03-09 MED ORDER — MIDAZOLAM HCL 5 MG/5ML IJ SOLN
INTRAMUSCULAR | Status: AC
  Filled 2021-03-09: qty 5

## 2021-03-09 MED ORDER — DEXAMETHASONE SODIUM PHOSPHATE 10 MG/ML IJ SOLN
INTRAMUSCULAR | Status: AC
Start: 2021-03-09 — End: ?
  Filled 2021-03-09: qty 1

## 2021-03-09 MED ORDER — ROPIVACAINE HCL 2 MG/ML IJ SOLN
INTRAMUSCULAR | Status: AC
  Filled 2021-03-09: qty 20

## 2021-03-09 MED ORDER — LIDOCAINE HCL 2 % IJ SOLN
20.0000 mL | Freq: Once | INTRAMUSCULAR | Status: AC
  Administered 2021-03-09: 400 mg

## 2021-03-09 MED ORDER — MIDAZOLAM HCL 5 MG/5ML IJ SOLN
0.5000 mg | Freq: Once | INTRAMUSCULAR | Status: AC
  Administered 2021-03-09: 2 mg via INTRAVENOUS

## 2021-03-09 NOTE — Patient Instructions (Addendum)
____________________________________________________________________________________________  Post-procedure Information What to expect: Most procedures involve the use of a local anesthetic (numbing medicine), and a steroid (anti-inflammatory medicine).  The local anesthetics may cause temporary numbness and weakness of the legs or arms, depending on the location of the block. This numbness/weakness may last 4-6 hours, depending on the local anesthetic used. In rare instances, it can last up to 24 hours. While numb, you must be very careful not to injure the extremity.  After any procedure, you could expect the pain to get better within 15-20 minutes. This relief is temporary and may last 4-6 hours. Once the local anesthetics wears off, you could experience discomfort, possibly more than usual, for up to 10 (ten) days. In the case of radiofrequencies, it may last up to 6 weeks. Surgeries may take up to 8 weeks for the healing process. The discomfort is due to the irritation caused by needles going through skin and muscle. To minimize the discomfort, we recommend using ice the first day, and heat from then on. The ice should be applied for 15 minutes on, and 15 minutes off. Keep repeating this cycle until bedtime. Avoid applying the ice directly to the skin, to prevent frostbite. Heat should be used daily, until the pain improves (4-10 days). Be careful not to burn yourself.  Occasionally you may experience muscle spasms or cramps. These occur as a consequence of the irritation caused by the needle sticks to the muscle and the blood that will inevitably be lost into the surrounding muscle tissue. Blood tends to be very irritating to tissues, which tend to react by going into spasm. These spasms may start the same day of your procedure, but they may also take days to develop. This late onset type of spasm or cramp is usually caused by electrolyte imbalances triggered by the steroids, at the level of the  kidney. Cramps and spasms tend to respond well to muscle relaxants, multivitamins (some are triggered by the procedure, but may have their origins in vitamin deficiencies), and Gatorade, or any sports drinks that can replenish any electrolyte imbalances. (If you are a diabetic, ask your pharmacist to get you a sugar-free brand.) Warm showers or baths may also be helpful. Stretching exercises are highly recommended.  General Instructions:  Be alert for signs of possible infection: redness, swelling, heat, red streaks, elevated temperature, and/or fever. These typically appear 4 to 6 days after the procedure. Immediately notify your doctor if you experience unusual bleeding, difficulty breathing, or loss of bowel or bladder control. If you experience increased pain, do not increase your pain medicine intake, unless instructed by your pain physician.  Post-Procedure Care:  Be careful in moving about. Muscle spasms in the area of the injection may occur. Applying ice or heat to the area is often helpful. The incidence of spinal headaches after epidural injections ranges between 1.4% and 6%. If you develop a headache that does not seem to respond to conservative therapy, please let your physician know. This can be treated with an epidural blood patch.   Post-procedure numbness or redness is to be expected, however it should average 4 to 6 hours. If numbness and weakness of your extremities begins to develop 4 to 6 hours after your procedure, and is felt to be progressing and worsening, immediately contact your physician.  Diet:  If you experience nausea, do not eat until this sensation goes away. If you had a Stellate Ganglion Block for upper extremity Reflex Sympathetic Dystrophy, do not eat or  drink until your hoarseness goes away. In any case, always start with liquids first and if you tolerate them well, then slowly progress to more solid foods.  Activity:  For the first 4 to 6 hours after the  procedure, use caution in moving about as you may experience numbness and/or weakness. Use caution in cooking, using household electrical appliances, and climbing steps. If you need to reach your Doctor call our office: [phone] (During business hours) or [phone] (After business hours).  Business Hours: Monday-Thursday 8:00 am - 4:00 PM    Fridays: Closed     In case of an emergency: In case of emergency, call 911 or go to the nearest emergency room and have the physician there call us.  Interpretation of Procedure Every nerve block has two components: a diagnostic component, and a treatment component. Unrealistic expectations are the most common causes of perceived failure.  In a perfect world, a single nerve block should be able to completely and permanently eliminate the pain. Sadly, the world is not perfect.  Most pain management nerve blocks are performed using local anesthetics and steroids. Steroids are responsible for any long-term benefit that you may experience. Their purpose is to decrease any chronic swelling that may exist in the area. Steroids begin to work immediately after being injected. However, most patients will not experience any benefits until 5 to 10 days after the injection, when the swelling has come down to the point where they can tell a difference. Steroids will only help if there is swelling to be treated. As such, they can assist with the diagnosis. If effective, they suggest an inflammatory component to the pain, and if ineffective, they rule out inflammation as the main cause or component of the problem. If the problem is one of mechanical compression, you will get no benefit from those steroids.   In the case of local anesthetics, they have a crucial role in the diagnosis of your condition. Most will begin to work within15 to 20 minutes after injection. The duration will depend on the type used (short- vs. Long-acting). It is of outmost importance that  patients keep tract of their pain, after the procedure. To assist with this matter, a Post-procedure Pain Diary is provided. Make sure to complete it and to bring it back to your follow-up appointment.  As long as the patient keeps accurate, detailed records of their symptoms after every procedure, and returns to have those interpreted, every procedure will provide [place] with invaluable information. Even a block that does not provide the patient with any relief, will always provide [place] with information about the mechanism and the origin of the pain. The only time a nerve block can be considered a waste of time is when patients do not keep track of the results, or do not keep their post-procedure appointment.  Reporting the results back to your physician The Pain Score  Pain is a subjective complaint. It cannot be seen, touched, or measured. We depend entirely on the patients report of the pain in order to assess your condition and treatment. To evaluate the pain, we use a pain scale, where 0 means No Pain, and a 10 is the worst possible pain that you can even imagine (i.e. something like been eaten alive by a shark or being torn apart by a lion).   Use the Pain Scale provided. You will frequently be asked to rate your pain. Please be accurate, remember that medical decisions will be based on your  responses. Please do not rate your pain above a 10. Doing so is actually interpreted as symptom magnification (exaggeration). To put this into perspective, when you tell us that your pain is at a 10 (ten), what you are saying is that there is nothing we can do to make this pain any worse. (Carefully think about that.) ____________________________________________________________________________________________   Dennis Bast may restart your Xarelto tomorrow.

## 2021-03-09 NOTE — Progress Notes (Signed)
PROVIDER NOTE: Interpretation of information contained herein should be left to medically-trained personnel. Specific patient instructions are provided elsewhere under "Patient Instructions" section of medical record. This document was created in part using STT-dictation technology, any transcriptional errors that may result from this process are unintentional.  Patient: [name] Type: Established DOB: 05/22/1955 MRN: [id number] PCP: Olin Hauser, DO  Service: Procedure DOS: 03/09/2021 Setting: Ambulatory Location: Ambulatory outpatient facility Delivery: Face-to-face Provider: Gillis Santa, MD Specialty: Interventional Pain Management Specialty designation: 09 Location: Outpatient facility Ref. Prov.: Gillis Santa, MD    Primary Reason for Visit: Interventional Pain Management Treatment. CC: Back Pain (lower)  Procedure:           Type: Lumbar Facet, Medial Branch Radiofrequency Ablation (RFA) #1  Laterality: Right  Level: L3, L4, L5, Medial Branch Level(s). These levels will denervate the L3-4 and L5-S1 lumbar facet joints.  Imaging: Fluoroscopic guidance Anesthesia: Local anesthesia (1-2% Lidocaine) Anxiolysis: IV Versed 2.0 mg (minimal) DOS: 03/09/2021  Performed by: Gillis Santa, MD  Purpose: Therapeutic/Palliative Indications: Low back pain severe enough to impact quality of life or function. Indications: 1. Lumbar facet joint syndrome   2. Lumbar facet arthropathy   3. Chronic pain syndrome    [name] has been dealing with the above chronic pain for longer than three months and has either failed to respond, was unable to tolerate, or simply did not get enough benefit from other more conservative therapies including, but not limited to: 1. Over-the-counter medications 2. Anti-inflammatory medications 3. Muscle relaxants 4. Membrane stabilizers 5. Opioids 6. Physical therapy and/or chiropractic manipulation 7. Modalities (Heat, ice, etc.) 8. Invasive  techniques such as nerve blocks. [name] has attained more than 50% relief of the pain from a series of diagnostic injections conducted in separate occasions.  Pain Score: Pre-procedure: 2 /10 Post-procedure: 0-No pain/10     Patient discontinued his Xarelto 3 days prior.  Position / Prep / Materials:  Position: Prone  Prep solution: DuraPrep (Iodine Povacrylex [0.7% available iodine] and Isopropyl Alcohol, 74% w/w) Prep Area: Entire Lumbosacral Region (Lower back from mid-thoracic region to end of tailbone and from flank to flank.) Materials:  Tray: RFA (Radiofrequency) tray Needle(s):  Type: RFA (Teflon-coated radiofrequency ablation needles) Gauge (G): 22  Length: Long (15cm) Qty: 3  Pre-op H&P Assessment:  [name] is a 66 y.o. (year old), male patient, seen today for interventional treatment. He  has a past surgical history that includes Knee surgery; Gastric bypass (2002); prostate seeding; Carpal tunnel release (Right, 12/23/2014); Carpal tunnel release (Left, 01/06/2015); Colonoscopy with propofol (N/A, 10/08/2017); and Cardioversion (N/A, 05/15/2019). [name] has a current medication list which includes the following prescription(s): acetaminophen, amiodarone, benazepril, buspirone, cyanocobalamin, furosemide, ibuprofen, metoprolol succinate, multivitamin with minerals, rivaroxaban, shingrix, trazodone, fluticasone, and hydrocodone-acetaminophen. His primarily concern today is the Back Pain (lower)  Initial Vital Signs:  Pulse/HCG Rate:  (!) 58ECG Heart Rate: (!) 54 Temp:  (!) 96.4 F (35.8 C) Resp: 16 BP:  (!) 177/95 SpO2: 100 %  BMI: Estimated body mass index is 46.93 kg/m as calculated from the following:   Height as of this encounter: 6' (1.829 m).   Weight as of this encounter: 346 lb (156.9 kg).  Risk Assessment: Allergies: Reviewed. He has No Known Allergies.  Allergy Precautions: None required Coagulopathies: Reviewed. None identified.   Blood-thinner therapy: None at this time Active Infection(s): Reviewed. None identified. [name] is afebrile  Site Confirmation: [name] was asked to confirm the procedure and  laterality before marking the site Procedure checklist: Completed Consent: Before the procedure and under the influence of no sedative(s), amnesic(s), or anxiolytics, the patient was informed of the treatment options, risks and possible complications. To fulfill our ethical and legal obligations, as recommended by the American Medical Association's Code of Ethics, I have informed the patient of my clinical impression; the nature and purpose of the treatment or procedure; the risks, benefits, and possible complications of the intervention; the alternatives, including doing nothing; the risk(s) and benefit(s) of the alternative treatment(s) or procedure(s); and the risk(s) and benefit(s) of doing nothing. The patient was provided information about the general risks and possible complications associated with the procedure. These may include, but are not limited to: failure to achieve desired goals, infection, bleeding, organ or nerve damage, allergic reactions, paralysis, and death. In addition, the patient was informed of those risks and complications associated to Spine-related procedures, such as failure to decrease pain; infection (i.e.: Meningitis, epidural or intraspinal abscess); bleeding (i.e.: epidural hematoma, subarachnoid hemorrhage, or any other type of intraspinal or peri-dural bleeding); organ or nerve damage (i.e.: Any type of peripheral nerve, nerve root, or spinal cord injury) with subsequent damage to sensory, motor, and/or autonomic systems, resulting in permanent pain, numbness, and/or weakness of one or several areas of the body; allergic reactions; (i.e.: anaphylactic reaction); and/or death. Furthermore, the patient was informed of those risks and complications associated with the medications. These  include, but are not limited to: allergic reactions (i.e.: anaphylactic or anaphylactoid reaction(s)); adrenal axis suppression; blood sugar elevation that in diabetics may result in ketoacidosis or comma; water retention that in patients with history of congestive heart failure may result in shortness of breath, pulmonary edema, and decompensation with resultant heart failure; weight gain; swelling or edema; medication-induced neural toxicity; particulate matter embolism and blood vessel occlusion with resultant organ, and/or nervous system infarction; and/or aseptic necrosis of one or more joints. Finally, the patient was informed that Medicine is not an exact science; therefore, there is also the possibility of unforeseen or unpredictable risks and/or possible complications that may result in a catastrophic outcome. The patient indicated having understood very clearly. We have given the patient no guarantees and we have made no promises. Enough time was given to the patient to ask questions, all of which were answered to the patient's satisfaction. Mr. Rea has indicated that he wanted to continue with the procedure. Attestation: I, the ordering provider, attest that I have discussed with the patient the benefits, risks, side-effects, alternatives, likelihood of achieving goals, and potential problems during recovery for the procedure that I have provided informed consent. Date   Time: 03/09/2021  7:57 AM  Pre-Procedure Preparation:  Monitoring: As per clinic protocol. Respiration, ETCO2, SpO2, BP, heart rate and rhythm monitor placed and checked for adequate function Safety Precautions: Patient was assessed for positional comfort and pressure points before starting the procedure. Time-out: I initiated and conducted the "Time-out" before starting the procedure, as per protocol. The patient was asked to participate by confirming the accuracy of the "Time Out" information. Verification of the correct  person, site, and procedure were performed and confirmed by me, the nursing staff, and the patient. "Time-out" conducted as per Joint Commission's Universal Protocol (UP.01.01.01). Time: 0821  Description of Procedure:          Laterality: Right Levels:  L3, L4, L5, Medial Branch Level(s), at the L3-4 and L5-S1 lumbar facet joints. Safety Precautions: Aspiration looking for blood return was conducted prior to  all injections. At no point did we inject any substances, as a needle was being advanced. Before injecting, the patient was told to immediately notify me if he was experiencing any new onset of "ringing in the ears, or metallic taste in the mouth". No attempts were made at seeking any paresthesias. Safe injection practices and needle disposal techniques used. Medications properly checked for expiration dates. SDV (single dose vial) medications used. After the completion of the procedure, all disposable equipment used was discarded in the proper designated medical waste containers. Local Anesthesia: Protocol guidelines were followed. The patient was positioned over the fluoroscopy table. The area was prepped in the usual manner. The time-out was completed. The target area was identified using fluoroscopy. A 12-in long, straight, sterile hemostat was used with fluoroscopic guidance to locate the targets for each level blocked. Once located, the skin was marked with an approved surgical skin marker. Once all sites were marked, the skin (epidermis, dermis, and hypodermis), as well as deeper tissues (fat, connective tissue and muscle) were infiltrated with a small amount of a short-acting local anesthetic, loaded on a 10cc syringe with a 25G, 1.5-in  Needle. An appropriate amount of time was allowed for local anesthetics to take effect before proceeding to the next step. Technical description of process:   Radiofrequency Ablation (RFA) L3 Medial Branch Nerve RFA: The target area for the L3 medial branch  is at the junction of the postero-lateral aspect of the superior articular process and the superior, posterior, and medial edge of the transverse process of L4. Under fluoroscopic guidance, a Radiofrequency needle was inserted until contact was made with os over the superior postero-lateral aspect of the pedicular shadow (target area). Sensory and motor testing was conducted to properly adjust the position of the needle. Once satisfactory placement of the needle was achieved, the numbing solution was slowly injected after negative aspiration for blood. 2.0 mL of the nerve block solution was injected without difficulty or complication. After waiting for at least 3 minutes, the ablation was performed. Once completed, the needle was removed intact. L4 Medial Branch Nerve RFA: The target area for the L4 medial branch is at the junction of the postero-lateral aspect of the superior articular process and the superior, posterior, and medial edge of the transverse process of L5. Under fluoroscopic guidance, a Radiofrequency needle was inserted until contact was made with os over the superior postero-lateral aspect of the pedicular shadow (target area). Sensory and motor testing was conducted to properly adjust the position of the needle. Once satisfactory placement of the needle was achieved, the numbing solution was slowly injected after negative aspiration for blood. 2.0 mL of the nerve block solution was injected without difficulty or complication. After waiting for at least 3 minutes, the ablation was performed. Once completed, the needle was removed intact. L5 Medial Branch Nerve RFA: The target area for the L5 medial branch is at the junction of the postero-lateral aspect of the superior articular process of S1 and the superior, posterior, and medial edge of the sacral ala. Under fluoroscopic guidance, a Radiofrequency needle was inserted until contact was made with os over the superior postero-lateral aspect of the  pedicular shadow (target area). Sensory and motor testing was conducted to properly adjust the position of the needle. Once satisfactory placement of the needle was achieved, the numbing solution was slowly injected after negative aspiration for blood. 2.0 mL of the nerve block solution was injected without difficulty or complication. After waiting for at least 3  minutes, the ablation was performed. Once completed, the needle was removed intact  Radiofrequency lesioning (ablation):  Radiofrequency Generator: Medtronic AccurianTM AG 1000 RF Generator Sensory Stimulation Parameters: 50 Hz was used to locate & identify the nerve, making sure that the needle was positioned such that there was no sensory stimulation below 0.3 V or above 0.7 V. Motor Stimulation Parameters: 2 Hz was used to evaluate the motor component. Care was taken not to lesion any nerves that demonstrated motor stimulation of the lower extremities at an output of less than 2.5 times that of the sensory threshold, or a maximum of 2.0 V. Lesioning Technique Parameters: Standard Radiofrequency settings. (Not bipolar or pulsed.) Temperature Settings: 80 degrees C Lesioning time: 60 seconds Intra-operative Compliance: Compliant  6 cc solution made of 5 cc of 0.2% ropivacaine, 1 cc of Decadron 10 mg/cc.  2 cc injected at each level above on the right after sensorimotor testing, prior to lesioning.  Once the entire procedure was completed, the treated area was cleaned, making sure to leave some of the prepping solution back to take advantage of its long term bactericidal properties.    Illustration of the posterior view of the lumbar spine and the posterior neural structures. Laminae of L2 through S1 are labeled. DPRL5, dorsal primary ramus of L5; DPRS1, dorsal primary ramus of S1; DPR3, dorsal primary ramus of L3; FJ, facet (zygapophyseal) joint L3-L4; I, inferior articular process of L4; LB1, lateral branch of dorsal primary ramus of L1;  IAB, inferior articular branches from L3 medial branch (supplies L4-L5 facet joint); IBP, intermediate branch plexus; MB3, medial branch of dorsal primary ramus of L3; NR3, third lumbar nerve root; S, superior articular process of L5; SAB, superior articular branches from L4 (supplies L4-5 facet joint also); TP3, transverse process of L3.  Vitals:   03/09/21 0825 03/09/21 0830 03/09/21 0835 03/09/21 0845  BP: 133/90 136/89 120/87 (!) 144/81  Pulse:      Resp: 20 16 12 16   Temp:    (!) 97 F (36.1 C)  SpO2: 96% 97% 97% 97%  Weight:      Height:       Start Time: 0821 hrs. End Time: 0838 hrs.  Imaging Guidance (Spinal):          Type of Imaging Technique: Fluoroscopy Guidance (Spinal) Indication(s): Assistance in needle guidance and placement for procedures requiring needle placement in or near specific anatomical locations not easily accessible without such assistance. Exposure Time: Please see nurses notes. Contrast: None used. Fluoroscopic Guidance: I was personally present during the use of fluoroscopy. "Tunnel Vision Technique" used to obtain the best possible view of the target area. Parallax error corrected before commencing the procedure. "Direction-depth-direction" technique used to introduce the needle under continuous pulsed fluoroscopy. Once target was reached, antero-posterior, oblique, and lateral fluoroscopic projection used confirm needle placement in all planes. Images permanently stored in EMR. Interpretation: No contrast injected. I personally interpreted the imaging intraoperatively. Adequate needle placement confirmed in multiple planes. Permanent images saved into the patient's record.  Post-operative Assessment:  Post-procedure Vital Signs:  Pulse/HCG Rate:  (!) 58(!) 50 Temp:  (!) 97 F (36.1 C) Resp: 16 BP: (!) 144/81 SpO2: 97 %  EBL: None  Complications: No immediate post-treatment complications observed by team, or reported by patient.  Note: The  patient tolerated the entire procedure well. A repeat set of vitals were taken after the procedure and the patient was kept under observation following institutional policy, for this type of procedure. Post-procedural  neurological assessment was performed, showing return to baseline, prior to discharge. The patient was provided with post-procedure discharge instructions, including a section on how to identify potential problems. Should any problems arise concerning this procedure, the patient was given instructions to immediately contact us, at any time, without hesitation. In any case, we plan to contact the patient by telephone for a follow-up status report regarding this interventional procedure.  Comments:  No additional relevant information.  Plan of Care  Orders:  Orders Placed This Encounter  Procedures   DG PAIN CLIN[facility] C-ARM 1-60 MIN NO REPORT    Intraoperative interpretation by procedural physician at [place].    Standing Status:   Standing    Number of Occurrences:   1    Order Specific Question:   Reason for exam:    Answer:   Assistance in needle guidance and placement for procedures requiring needle placement in or near specific anatomical locations not easily accessible without such assistance.   Chronic Opioid Analgesic:  Tramadol 50 mg 4 times daily as needed, quantity 120/month; MME equals 20    Medications ordered for procedure: Meds ordered this encounter  Medications   lidocaine (XYLOCAINE) 2 % (with pres) injection 400 mg   midazolam (VERSED) 5 MG/5ML injection 0.5-2 mg    Make sure Flumazenil is available in the pyxis when using this medication. If oversedation occurs, administer 0.2 mg IV over 15 sec. If after 45 sec no response, administer 0.2 mg again over 1 min; may repeat at 1 min intervals; not to exceed 4 doses (1 mg)   dexamethasone (DECADRON) injection 10 mg   ropivacaine (PF) 2 mg/mL (0.2%) (NAROPIN) injection 9 mL   Medications  administered: We administered lidocaine, midazolam, dexamethasone, and ropivacaine (PF) 2 mg/mL (0.2%).  See the medical record for exact dosing, route, and time of administration.  Follow-up plan:   Return in about 6 weeks (around 04/20/2021) for PP VV.       s/p lesi #1 (left L4/5) on 8/17, #2 on 10/21/2018 (left L3/4), #3 on 12/09/2018 (Left L3/4); 02/24/2019: Right wrist injection and left trigger finger (middle finger) injection.,  Bilateral wrist injection 10/15/2019, bilateral knee Hyalgan No. 1 01/26/1960. #2 02/23/2020, #3 03/24/2020.  Endorsing approximately 75 to 80% pain relief for bilateral knee pain.  Lumbar L3-5 RFA, Left: 02/16/21, Right 03/09/21        Recent Visits Date Type Provider Dept  02/16/21 Procedure visit Gillis Santa, MD [facility]  02/01/21 Office Visit Gillis Santa, MD [facility]  01/05/21 Office Visit Gillis Santa, MD [facility]  12/15/20 Procedure visit Gillis Santa, MD [facility]  Showing recent visits within past 90 days and meeting all other requirements Today's Visits Date Type Provider Dept  03/09/21 Procedure visit Gillis Santa, MD [facility]  Showing today's visits and meeting all other requirements Future Appointments Date Type Provider Dept  03/29/21 Appointment Gillis Santa, MD [facility]  04/20/21 Appointment Gillis Santa, MD [facility]  Showing future appointments within next 90 days and meeting all other requirements  Disposition: Discharge home  Discharge (Date   Time): 03/09/2021;   hrs.   Primary Care Physician: Olin Hauser, DO Location: [facility] Note by: Gillis Santa, MD Date: 03/09/2021; Time: 8:47 AM  Disclaimer:  Medicine is not an exact science. The only guarantee in medicine is that nothing is guaranteed. It is important to note that the decision to proceed with this  intervention was based on the information collected  from the patient. The Data and conclusions were drawn from the patient's questionnaire, the interview, and the physical examination. Because the information was provided in large part by the patient, it cannot be guaranteed that it has not been purposely or unconsciously manipulated. Every effort has been made to obtain as much relevant data as possible for this evaluation. It is important to note that the conclusions that lead to this procedure are derived in large part from the available data. Always take into account that the treatment will also be dependent on availability of resources and existing treatment guidelines, considered by other Pain Management Practitioners as being common knowledge and practice, at the time of the intervention. For Medico-Legal purposes, it is also important to point out that variation in procedural techniques and pharmacological choices are the acceptable norm. The indications, contraindications, technique, and results of the above procedure should only be interpreted and judged by a Board-Certified Interventional Pain Specialist with extensive familiarity and expertise in the same exact procedure and technique.

## 2021-03-10 ENCOUNTER — Telehealth: Payer: Self-pay

## 2021-03-10 ENCOUNTER — Ambulatory Visit: Payer: Self-pay

## 2021-03-10 ENCOUNTER — Telehealth: Payer: Self-pay | Admitting: Pain Medicine

## 2021-03-10 ENCOUNTER — Telehealth: Payer: Self-pay | Admitting: Student in an Organized Health Care Education/Training Program

## 2021-03-10 NOTE — Telephone Encounter (Signed)
Patient called and states that he would like to have an non narcotic pain medication if possible.  States he has tried Gabapentin.States that he take OTC Ibuprofen and it seems to help.  Spoke to Dr Holley Raring and he states that the patient may take 2-3 OTC ibuprofen tid.  Patient Notified and instructed to call us in 2 weeks if it is not helping.

## 2021-03-10 NOTE — Telephone Encounter (Signed)
Patient has some questions about medications for his pain. Had appt yesterday Please call.

## 2021-03-10 NOTE — Telephone Encounter (Signed)
°  Chief Complaint: insomnia Symptoms: insomnia Frequency: 1 month now Pertinent Negatives: NA Disposition: [] ED /[] Urgent Care (no appt availability in office) / [] Appointment(In office/virtual)/ []  [facility]/ [] Home Care/ [] Refused Recommended Disposition /[] [employer] [facility]/ [x]  Follow-up with PCP Additional Notes: Pt had OV on 03/01/21 was give buspar and melatonin for sleep. Pt states the buspar helps with anxiety but doesn't help him sleep. He states Dr. Raliegh Ip told him to f/up if medications didn't help and something else could be prescribed. Advised pt I will send message to him and we can f/up.    Summary: Patient not sleeping at night   Patient called in to inform Dr Raliegh Ip that when he saw him last visit with sleep issue the busPIRone (BUSPAR) 5 MG tablet that was prescribed does not help him to sleep however it does keep him calm whenever he is a bit antsy. Patient would like Dr Raliegh Ip to know that he get an hhour to an hour and a half of sleep each night and its about a month now and it is affecting his BP. Would like some sort of recommendation please can be reached at Ph# [phone]      Reason for Disposition  Requesting medication for sleep ("sleeping pill")  Answer Assessment - Initial Assessment Questions 1. DESCRIPTION: "Tell me about your sleeping problem."      Unable to sleep at night, sleeps 1-1.5hr at night 2. ONSET: "How long have you been having trouble sleeping?" (e.g., days, weeks, months)     1 month 3. RECURRENT: "Have you had sleeping problems before?"  If Yes, ask: "What happened that time?" "What helped your sleeping problem go away in the past?"      Had going on for a while 4. STRESS: "Is there anything in your life that is making you feel stressed or tense?"     No 8. OTHER SYMPTOMS: "Do you have any other symptoms?"  (e.g., difficulty breathing)     No  Protocols used: Insomnia-A-AH

## 2021-03-10 NOTE — Telephone Encounter (Signed)
Called PP. Denies any needs at this time. Instructed to call back if needed.

## 2021-03-10 NOTE — Telephone Encounter (Signed)
Returned patient call.  Spoke with wife.  He has stepped away from his phone.  She will have return the call when he returns.

## 2021-03-10 NOTE — Telephone Encounter (Signed)
error 

## 2021-03-10 NOTE — Telephone Encounter (Signed)
If you can call him, let me know what he would like to do  We had a long conversation about his insomnia on 03/01/21 office visit.  There is not a simple answer to his request.  His pain and anxiety are keeping him awake primarily, is what he told me. Unfortunately - sleeping pills to help sleep when pain is the source of the insomnia, is not always successful.  2.  Next 2 options for sleep / anxiety are:  Lexapro - daily anti-depressant / anxiety medication that can help improve sleep eventually (takes 2-4 weeks for full effect) - it will help only if anxiety is keeping him awake. (Not pain).  Hydroxyzine - this is a cousin to benadryl, it is a sedating medicine that can be used to have calming effect to help calm nerves and sleep.  I would offer these two options.  If these do not work, then he should schedule once more to discuss with me in more detail and we can discuss other stronger rx sleep medications such as Ambien.  Nobie Putnam, DO [facility] 03/10/2021, 2:34 PM

## 2021-03-11 NOTE — Telephone Encounter (Signed)
Tried calling the patient, left message to call back to go over suggestions.
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
## 2021-03-14 ENCOUNTER — Ambulatory Visit: Payer: Medicare Other | Admitting: Cardiovascular Disease

## 2021-03-14 ENCOUNTER — Other Ambulatory Visit: Payer: Self-pay

## 2021-03-14 ENCOUNTER — Encounter: Payer: Self-pay | Admitting: Cardiovascular Disease

## 2021-03-14 VITALS — BP 140/70 | HR 50 | Ht 72.0 in | Wt 342.5 lb

## 2021-03-14 DIAGNOSIS — I2510 Atherosclerotic heart disease of native coronary artery without angina pectoris: Secondary | ICD-10-CM

## 2021-03-14 DIAGNOSIS — T466X5A Adverse effect of antihyperlipidemic and antiarteriosclerotic drugs, initial encounter: Secondary | ICD-10-CM | POA: Diagnosis not present

## 2021-03-14 DIAGNOSIS — I10 Essential (primary) hypertension: Secondary | ICD-10-CM | POA: Diagnosis not present

## 2021-03-14 DIAGNOSIS — M791 Myalgia, unspecified site: Secondary | ICD-10-CM

## 2021-03-14 DIAGNOSIS — I2584 Coronary atherosclerosis due to calcified coronary lesion: Secondary | ICD-10-CM

## 2021-03-14 DIAGNOSIS — E782 Mixed hyperlipidemia: Secondary | ICD-10-CM | POA: Diagnosis not present

## 2021-03-14 DIAGNOSIS — I519 Heart disease, unspecified: Secondary | ICD-10-CM

## 2021-03-14 DIAGNOSIS — I4819 Other persistent atrial fibrillation: Secondary | ICD-10-CM | POA: Diagnosis not present

## 2021-03-14 MED ORDER — AMLODIPINE BESYLATE 5 MG PO TABS: 5.0000 mg | ORAL_TABLET | Freq: Every day | ORAL | 3 refills | Status: DC

## 2021-03-14 NOTE — Patient Instructions (Addendum)
Medication Instructions:   Your physician has recommended you make the following change in your medication:   1) START Amlodipine 5 mg: - take 1 tablet by mouth once daily in the evening  If you need a refill on your cardiac medications before your next appointment, please call your pharmacy.    Lab work: No new labs needed   Testing/Procedures: No new testing needed   Follow-Up: At [facility], you and your health needs are our priority.  As part of our continuing mission to provide you with exceptional heart care, we have created designated Provider Care Teams.  These Care Teams include your primary Cardiologist (physician) and Advanced Practice Providers (APPs -  Physician Assistants and Nurse Practitioners) who all work together to provide you with the care you need, when you need it.  You will need a follow up appointment in 12 months  Providers on your designated Care Team:   Murray Hodgkins, NP Christell Faith, PA-C Cadence Kathlen Mody, [place]  COVID-19 Vaccine Information can be found at: [email or web address].uk For questions related to vaccine distribution or appointments, please email [email or web address] or call [phone].

## 2021-03-29 ENCOUNTER — Ambulatory Visit
Admission: RE | Admit: 2021-03-29 | Discharge: 2021-03-29 | Disposition: A | Discharge status: Home / Self Care | Payer: Medicare Other | Source: Ambulatory Visit | Attending: Student in an Organized Health Care Education/Training Program | Admitting: Student in an Organized Health Care Education/Training Program

## 2021-03-29 ENCOUNTER — Other Ambulatory Visit: Payer: Self-pay

## 2021-03-29 ENCOUNTER — Encounter: Payer: Self-pay | Admitting: Student in an Organized Health Care Education/Training Program

## 2021-03-29 ENCOUNTER — Ambulatory Visit: Payer: Medicare Other | Admitting: Student in an Organized Health Care Education/Training Program

## 2021-03-29 VITALS — BP 180/88 | HR 63 | Temp 98.1°F | Resp 16 | Ht 72.0 in | Wt 346.0 lb

## 2021-03-29 DIAGNOSIS — G894 Chronic pain syndrome: Secondary | ICD-10-CM

## 2021-03-29 DIAGNOSIS — M6530 Trigger finger, unspecified finger: Secondary | ICD-10-CM | POA: Insufficient documentation

## 2021-03-29 DIAGNOSIS — M25511 Pain in right shoulder: Secondary | ICD-10-CM | POA: Insufficient documentation

## 2021-03-29 DIAGNOSIS — M19012 Primary osteoarthritis, left shoulder: Secondary | ICD-10-CM | POA: Insufficient documentation

## 2021-03-29 DIAGNOSIS — M19011 Primary osteoarthritis, right shoulder: Secondary | ICD-10-CM

## 2021-03-29 DIAGNOSIS — G8929 Other chronic pain: Secondary | ICD-10-CM | POA: Insufficient documentation

## 2021-03-29 DIAGNOSIS — M25512 Pain in left shoulder: Secondary | ICD-10-CM

## 2021-03-29 NOTE — Progress Notes (Signed)
Nursing Pain Medication Assessment:  ?Safety precautions to be maintained throughout the outpatient stay will include: orient to surroundings, keep bed in low position, maintain call bell within reach at all times, provide assistance with transfer out of bed and ambulation.  ?Medication Inspection Compliance: Pill count conducted under aseptic conditions, in front of the patient. Neither the pills nor the bottle was removed from the patient's sight at any time. Once count was completed pills were immediately returned to the patient in their original bottle. ? ?Medication: Hydrocodone/APAP ?Pill/Patch Count:  48 of 98 pills remain ?Pill/Patch Appearance: Markings consistent with prescribed medication ?Bottle Appearance: Standard pharmacy container. Clearly labeled. ?Filled Date: 01 / 17 / 2023 ?Last Medication intake:   03/14/2021 ? ? ?Not able to complete Tramadol count r/t pt combined 2 containers of differently shaped pills. ?

## 2021-03-29 NOTE — Patient Instructions (Signed)
? ?  Do not stop Xarelto per Dr Holley Raring ? ? ?Preparing for your procedure (without sedation) ?Instructions: ?Oral Intake: Do not eat or drink anything for at least 3 hours prior to your procedure. ?Transportation: Unless otherwise stated by your physician, you may drive yourself after the procedure. ?Blood Pressure Medicine: Take your blood pressure medicine with a sip of water the morning of the procedure. ?Insulin: Take only ? of your normal insulin dose. ?Preventing infections: Shower with an antibacterial soap the morning of your procedure. ?Build-up your immune system: Take 1000 mg of Vitamin C with every meal (3 times a day) the day prior to your procedure. ?Pregnancy: If you are pregnant, call and cancel the procedure. ?Sickness: If you have a cold, fever, or any active infections, call and cancel the procedure. ?Arrival: You must be in the facility at least 30 minutes prior to your scheduled procedure. ?Children: Do not bring any children with you. ?Dress appropriately: Bring dark clothing that you would not mind if they get stained. ?Valuables: Do not bring any jewelry or valuables. ?Procedure appointments are reserved for interventional treatments only. ?No Prescription Refills. ?No medication changes will be discussed during procedure appointments. ?No disability issues will be discussed.  ?

## 2021-03-29 NOTE — Progress Notes (Signed)
PROVIDER NOTE: Information contained herein reflects review and annotations entered in association with encounter. Interpretation of such information and data should be left to medically-trained personnel. Information provided to patient can be located elsewhere in the medical record under "Patient Instructions". Document created using STT-dictation technology, any transcriptional errors that may result from process are unintentional.    Patient: [name]  Service Category: E/M  Provider: Edward Jolly, MD  DOB: 1955-05-22  DOS: 03/29/2021  Specialty: Interventional Pain Management  MRN: [id number]  Setting: Ambulatory outpatient  PCP: Smitty Cords, DO  Type: Established Patient    Referring Provider: Saralyn Pilar *  Location: Office  Delivery: Face-to-face     HPI  [name], a 66 y.o. year old male, is here today because of his Localized osteoarthritis of shoulder regions, bilateral [M19.011, M19.012]. [name] primary complain today is Back Pain (lower), Hand Pain (Right trigger finger), and Shoulder Pain (Bilateral, LEFT worse) Last encounter: My last encounter with him was on 03/10/2021. Pertinent problems: [name] has H/O gastric bypass; DJD (degenerative joint disease) of knee; Bilateral carpal tunnel syndrome; Paroxysmal atrial fibrillation (HCC); Chronic radicular lumbar pain; Lumbar radiculopathy; Lumbar degenerative disc disease; Lumbar facet arthropathy; Lumbar facet joint syndrome; Primary osteoarthritis of both wrists; Trigger middle finger of left hand; and Chronic pain syndrome on their pertinent problem list. Pain Assessment: Severity of Chronic pain is reported as a 3 /10. Location: Back Lower/denies. Onset:  . Quality:  . Timing: Constant ("tho only mildly dull"). Modifying factor(s): Hasn not taken narcotic since 2/27, taking ibuprofen at times. Vitals:  height is 6' (1.829 m) and weight is 346 lb (156.9 kg) (abnormal). His temporal temperature is  98.1 F (36.7 C). His blood pressure is 180/88 (abnormal) and his pulse is 63. His respiration is 16 and oxygen saturation is 98%.   Reason for encounter: post-procedure evaluation and assessment.  Patient is also endorsing bilateral shoulder pain as well as return of his right middle finger trigger finger which he would like to get injected.  Of note patient is status post right L3, L4, L5 RFA as well as left L3, L4, L5 RFA on 03/09/2021 and 02/16/2021 respectively.  Patient states that his low back pain secondary to lumbar facet arthropathy and lumbar degenerative disc disease is significantly better.  He is not utilizing any of his as needed opioid analgesics.  He is able to walk a greater distance and is more functional.  He is very pleased with the results.  He is now complaining of increased bilateral shoulder pain related to shoulder osteoarthritis.  He has difficulty with overhand reach.  He has tried ibuprofen, Bengay, IcyHot.    ROS  Constitutional: Denies any fever or chills Gastrointestinal: No reported hemesis, hematochezia, vomiting, or acute GI distress Musculoskeletal:  Bilateral shoulder pain, right middle finger trigger finger Neurological: No reported episodes of acute onset apraxia, aphasia, dysarthria, agnosia, amnesia, paralysis, loss of coordination, or loss of consciousness  Medication Review  Aspirin, HYDROcodone-acetaminophen, Ibuprofen, Zoster Vaccine Adjuvanted, acetaminophen, amLODipine, amiodarone, benazepril, busPIRone, cyanocobalamin, fluticasone, furosemide, ibuprofen, melatonin, metoprolol succinate, multivitamin with minerals, rivaroxaban, and traZODone  History Review  Allergy: [name] has No Known Allergies. Drug: [name]  reports no history of drug use. Alcohol:  reports current alcohol use of about 12.0 standard drinks per week. Tobacco:  reports that he quit smoking about 42 years ago. His smoking use included cigarettes. He has a 10.00 pack-year  smoking history. He has quit using smokeless  tobacco.  His smokeless tobacco use included snuff. Social: [name]  reports that he quit smoking about 42 years ago. His smoking use included cigarettes. He has a 10.00 pack-year smoking history. He has quit using smokeless tobacco.  His smokeless tobacco use included snuff. He reports current alcohol use of about 12.0 standard drinks per week. He reports that he does not use drugs. Medical:  has a past medical history of Cardiomyopathy (in setting of Afib), DJD (degenerative joint disease) of knee, History of kidney stones, Intervertebral disc disorder with radiculopathy of lumbosacral region, Kidney stone (2012), Lower extremity edema, PAF (paroxysmal atrial fibrillation) (HCC), Pernicious anemia, Prostate cancer (HCC), Pulmonary nodule, right, and SVT (supraventricular tachycardia) (HCC). Surgical: [name]  has a past surgical history that includes Knee surgery; Gastric bypass (2002); prostate seeding; Carpal tunnel release (Right, 12/23/2014); Carpal tunnel release (Left, 01/06/2015); Colonoscopy with propofol (N/A, 10/08/2017); and Cardioversion (N/A, 05/15/2019). Family: family history includes Arrhythmia in his brother and sister; Cancer in his father; Heart disease in his father; Hypertension in his father; Stroke in his father.  Laboratory Chemistry Profile   Renal Lab Results  Component Value Date   BUN 17 07/21/2020   CREATININE 1.21 07/21/2020   BCR NOT APPLICABLE 07/21/2020   GFRAA 72 07/21/2020   GFRNONAA 62 07/21/2020    Hepatic Lab Results  Component Value Date   AST 15 07/21/2020   ALT 11 07/21/2020   ALBUMIN 4.6 08/01/2019   ALKPHOS 72 08/01/2019   LIPASE 28 03/13/2017    Electrolytes Lab Results  Component Value Date   NA 143 07/21/2020   K 4.2 07/21/2020   CL 108 07/21/2020   CALCIUM 9.0 07/21/2020   MG 2.0 02/01/2021    Bone Lab Results  Component Value Date   25OHVITD1 32 02/01/2021   25OHVITD2 <1.0 02/01/2021    25OHVITD3 32 02/01/2021    Inflammation (CRP: Acute Phase) (ESR: Chronic Phase) No results found for: CRP, ESRSEDRATE, LATICACIDVEN       Note: Above Lab results reviewed.  Recent Imaging Review  DG PAIN CLINIC C-ARM 1-60 MIN NO REPORT Fluoro was used, but no Radiologist interpretation will be provided.  Please refer to "NOTES" tab for provider progress note. Note: Reviewed        Physical Exam  General appearance: Well nourished, well developed, and well hydrated. In no apparent acute distress Mental status: Alert, oriented x 3 (person, place, & time)       Respiratory: No evidence of acute respiratory distress Eyes: PERLA Vitals: BP (!) 180/88   Pulse 63   Temp 98.1 F (36.7 C) (Temporal)   Resp 16   Ht 6' (1.829 m)   Wt (!) 346 lb (156.9 kg)   SpO2 98%   BMI 46.93 kg/m  BMI: Estimated body mass index is 46.93 kg/m as calculated from the following:   Height as of this encounter: 6' (1.829 m).   Weight as of this encounter: 346 lb (156.9 kg). Ideal: Ideal body weight: 77.6 kg (171 lb 1.2 oz) Adjusted ideal body weight: 109.3 kg (241 lb 0.7 oz)  Right Shoulder Exam   Tenderness  The patient is experiencing tenderness in the acromion.  Range of Motion  Active abduction:  abnormal  Passive abduction:  abnormal  Extension:  abnormal  Internal rotation 0 degrees:  abnormal  Internal rotation 90 degrees:  abnormal   Muscle Strength  The patient has normal right shoulder strength.  Other  Erythema: absent Scars: absent Sensation: normal Pulse:  present   Left Shoulder Exam   Tenderness  The patient is experiencing tenderness in the acromion.  Range of Motion  Active abduction:  abnormal  Passive abduction:  abnormal  Extension:  abnormal  Internal rotation 0 degrees:  abnormal  Internal rotation 90 degrees:  abnormal   Muscle Strength  The patient has normal left shoulder strength.  Other  Erythema: absent Scars: absent Sensation: normal Pulse:  present       Assessment   Status Diagnosis  Having a Flare-up Having a Flare-up Having a Flare-up 1. Localized osteoarthritis of shoulder regions, bilateral   2. Chronic pain of both shoulders   3. Trigger finger of right hand, unspecified finger   4. Chronic pain syndrome      Updated Problems: Problem  Localized Osteoarthritis of Shoulder Regions, Bilateral  Chronic Pain of Both Shoulders    Plan of Care    -I provided the patient with a physician directed physical therapy regimen for his bilateral shoulder pain.  It was attached to his after visit summary -Obtain bilateral shoulder x-rays -Plan for bilateral shoulder steroid injection as well as right middle finger trigger finger injection   Orders:  Orders Placed This Encounter  Procedures   SHOULDER INJECTION    Standing Status:   Future    Standing Expiration Date:   05/29/2021    Scheduling Instructions:     Procedure: Intra-articular shoulder (Glenohumeral) joint injection     Side: Bilateral     Level: Glenohumeral joint               Sedation: without     Timeframe: As permitted by the schedule    Order Specific Question:   Where will this procedure be performed?    Answer:   [facility]   Injection tendon or ligament    Standing Status:   Future    Standing Expiration Date:   06/29/2021    Scheduling Instructions:     Trigger finger injection   DG Shoulder Left    Standing Status:   Future    Standing Expiration Date:   04/29/2021    Scheduling Instructions:     Imaging must be done as soon as possible. Inform patient that order will expire within 30 days and I will not renew it.    Order Specific Question:   Reason for Exam (SYMPTOM  OR DIAGNOSIS REQUIRED)    Answer:   Left shoulder pain    Order Specific Question:   Preferred imaging location?    Answer:   [facility]    Order Specific Question:   Call Results- Best Contact Number?    Answer:   [phone]) [phone] [facility])     Order Specific Question:   Release to patient    Answer:   Immediate   DG Shoulder Right    Standing Status:   Future    Standing Expiration Date:   04/29/2021    Scheduling Instructions:     Imaging must be done as soon as possible. Inform patient that order will expire within 30 days and I will not renew it.    Order Specific Question:   Reason for Exam (SYMPTOM  OR DIAGNOSIS REQUIRED)    Answer:   Right shoulder pain    Order Specific Question:   Preferred imaging location?    Answer:   [facility]    Order Specific Question:   Call Results- Best Contact Number?    Answer:   (  [phone] [facility])    Order Specific Question:   Release to patient    Answer:   Immediate   Follow-up plan:   Return in about 2 weeks (around 04/12/2021) for R middle finger trigger finger, B/L shoulder injection, in clinic NS.     s/p lesi #1 (left L4/5) on 8/17, #2 on 10/21/2018 (left L3/4), #3 on 12/09/2018 (Left L3/4); 02/24/2019: Right wrist injection and left trigger finger (middle finger) injection.,  Bilateral wrist injection 10/15/2019, bilateral knee Hyalgan No. 1 01/26/1960. #2 02/23/2020, #3 03/24/2020.  Endorsing approximately 75 to 80% pain relief for bilateral knee pain.  Lumbar L3-5 RFA, Left: 02/16/21, Right 03/09/21: 90% pain relief         Recent Visits Date Type Provider Dept  03/09/21 Procedure visit Edward Jolly, MD [facility]  02/16/21 Procedure visit Edward Jolly, MD [facility]  02/01/21 Office Visit Edward Jolly, MD [facility]  01/05/21 Office Visit Edward Jolly, MD [facility]  Showing recent visits within past 90 days and meeting all other requirements Today's Visits Date Type Provider Dept  03/29/21 Office Visit Edward Jolly, MD [facility]  Showing today's visits and meeting all other requirements Future Appointments Date Type Provider Dept  04/11/21 Appointment Edward Jolly, MD [facility]  04/20/21  Appointment Edward Jolly, MD [facility]  Showing future appointments within next 90 days and meeting all other requirements  I discussed the assessment and treatment plan with the patient. The patient was provided an opportunity to ask questions and all were answered. The patient agreed with the plan and demonstrated an understanding of the instructions.  Patient advised to call back or seek an in-person evaluation if the symptoms or condition worsens.  Duration of encounter: .  Note by: Edward Jolly, MD Date: 03/29/2021; Time: 9:40 AM

## 2021-04-11 ENCOUNTER — Encounter: Payer: Self-pay | Admitting: Student in an Organized Health Care Education/Training Program

## 2021-04-11 ENCOUNTER — Ambulatory Visit
Admission: RE | Admit: 2021-04-11 | Discharge: 2021-04-11 | Disposition: A | Discharge status: Home / Self Care | Payer: Medicare Other | Source: Ambulatory Visit | Attending: Student in an Organized Health Care Education/Training Program | Admitting: Student in an Organized Health Care Education/Training Program

## 2021-04-11 ENCOUNTER — Ambulatory Visit (HOSPITAL_BASED_OUTPATIENT_CLINIC_OR_DEPARTMENT_OTHER): Payer: Medicare Other | Admitting: Student in an Organized Health Care Education/Training Program

## 2021-04-11 ENCOUNTER — Ambulatory Visit
Admission: RE | Admit: 2021-04-11 | Discharge: 2021-04-11 | Disposition: A | Discharge status: Home / Self Care | Payer: Medicare Other | Attending: Student in an Organized Health Care Education/Training Program | Admitting: Student in an Organized Health Care Education/Training Program

## 2021-04-11 ENCOUNTER — Other Ambulatory Visit: Payer: Self-pay

## 2021-04-11 VITALS — BP 124/56 | HR 53 | Temp 97.2°F | Resp 17 | Ht 72.0 in | Wt 331.0 lb

## 2021-04-11 DIAGNOSIS — M25511 Pain in right shoulder: Secondary | ICD-10-CM

## 2021-04-11 DIAGNOSIS — M6530 Trigger finger, unspecified finger: Secondary | ICD-10-CM | POA: Insufficient documentation

## 2021-04-11 DIAGNOSIS — G8929 Other chronic pain: Secondary | ICD-10-CM | POA: Diagnosis not present

## 2021-04-11 DIAGNOSIS — M19012 Primary osteoarthritis, left shoulder: Secondary | ICD-10-CM | POA: Diagnosis not present

## 2021-04-11 DIAGNOSIS — M19011 Primary osteoarthritis, right shoulder: Secondary | ICD-10-CM | POA: Insufficient documentation

## 2021-04-11 DIAGNOSIS — M533 Sacrococcygeal disorders, not elsewhere classified: Secondary | ICD-10-CM | POA: Insufficient documentation

## 2021-04-11 DIAGNOSIS — M25512 Pain in left shoulder: Secondary | ICD-10-CM | POA: Insufficient documentation

## 2021-04-11 MED ORDER — IOHEXOL 180 MG/ML  SOLN
10.0000 mL | Freq: Once | INTRAMUSCULAR | Status: AC
  Administered 2021-04-11: 10 mL via INTRA_ARTICULAR

## 2021-04-11 MED ORDER — LIDOCAINE HCL 2 % IJ SOLN
20.0000 mL | Freq: Once | INTRAMUSCULAR | Status: AC
  Administered 2021-04-11: 100 mg
  Filled 2021-04-11: qty 40

## 2021-04-11 MED ORDER — METHYLPREDNISOLONE ACETATE 80 MG/ML IJ SUSP
80.0000 mg | Freq: Once | INTRAMUSCULAR | Status: AC
  Administered 2021-04-11: 80 mg via INTRA_ARTICULAR
  Filled 2021-04-11: qty 1

## 2021-04-11 MED ORDER — DEXAMETHASONE SODIUM PHOSPHATE 10 MG/ML IJ SOLN
10.0000 mg | Freq: Once | INTRAMUSCULAR | Status: AC
  Administered 2021-04-11: 10 mg
  Filled 2021-04-11: qty 1

## 2021-04-11 MED ORDER — ROPIVACAINE HCL 2 MG/ML IJ SOLN
9.0000 mL | Freq: Once | INTRAMUSCULAR | Status: AC
  Administered 2021-04-11: 9 mL via INTRA_ARTICULAR
  Filled 2021-04-11: qty 20

## 2021-04-11 NOTE — Progress Notes (Signed)
PROVIDER NOTE: Interpretation of information contained herein should be left to medically-trained personnel. Specific patient instructions are provided elsewhere under "Patient Instructions" section of medical record. This document was created in part using STT-dictation technology, any transcriptional errors that may result from this process are unintentional.  ?Patient: [name] ?Type: Established ?DOB: [birth year] ?MRN: [id number] ?PCP: Olin Hauser, DO  Service: Procedure ?DOS: 04/11/2021 ?Setting: Ambulatory ?Location: Ambulatory outpatient facility ?Delivery: Face-to-face Provider: Gillis Santa, MD ?Specialty: Interventional Pain Management ?Specialty designation: 09 ?Location: Outpatient facility ?Ref. Prov.: Nobie Putnam *   ? ?Primary Reason for Visit: Interventional Pain Management Treatment. ?CC: Shoulder Pain (Bilateral, right trigger finger) ? ?  ?Procedure:          Anesthesia, Analgesia, Anxiolysis:  ?Type: Trigger Finger Ligament/Tendon sheath (20550) Injection.  #2  ?Purpose: Diagnostic ?Target Area: Flexor Digitorum Tendon sheath nodule ?Region: A-1(proximal) pulley of the metacarpal area ?Approach: Percutaneous ?Digit: No:4(Ring) Finger ?Laterality: Right-hand  Type: Local Anesthesia ?Local Anesthetic: Lidocaine 1-2% ?Sedation: None  ?Indication(s):  Analgesia ?Route: Infiltration (Gregory/IM) ?IV Access: N/A ? ? ?Position: Sitting  ? ?1. Localized osteoarthritis of shoulder regions, bilateral   ?2. Trigger finger of right hand, unspecified finger   ?3. Chronic pain of both shoulders   ?4. Sacroiliac joint pain   ? ?NAS-11 Pain score:  ? Pre-procedure: 8 /10  ? Post-procedure:  (2/ right    3/ left)/10  ? ?  ?Pre-op H&P Assessment:  ?Mr. Loney is a 66 y.o. (year old), male patient, seen today for interventional treatment. He  has a past surgical history that includes Knee surgery; Gastric bypass (2002); prostate seeding; Carpal tunnel release (Right, 12/23/2014); Carpal tunnel  release (Left, 01/06/2015); Colonoscopy with propofol (N/A, 10/08/2017); and Cardioversion (N/A, 05/15/2019). Mr. Kirk has a current medication list which includes the following prescription(s): acetaminophen, amiodarone, amlodipine, aspirin, benazepril, buspirone, cyanocobalamin, fluticasone, furosemide, ibuprofen, ibuprofen, melatonin, metoprolol succinate, multivitamin with minerals, rivaroxaban, trazodone, and shingrix. His primarily concern today is the Shoulder Pain (Bilateral, right trigger finger) ? ?Initial Vital Signs:  ?Pulse/HCG Rate:  ?(!) 53 ?ECG Heart Rate: (!) 57 ?Temp: (!) 97.2 ?F (36.2 ?C) ?Resp: 16 ?BP: (!) 154/85 ?SpO2: 99 % ? ?BMI: Estimated body mass index is 44.89 kg/m? as calculated from the following: ?  Height as of this encounter: 6' (1.829 m). ?  Weight as of this encounter: 331 lb (150.1 kg). ? ?Risk Assessment: ?Allergies: Reviewed. He has No Known Allergies.  ?Allergy Precautions: None required ?Coagulopathies: Reviewed. None identified.  ?Blood-thinner therapy: None at this time ?Active Infection(s): Reviewed. None identified. Mr. Bostrom is afebrile ? ?Site Confirmation: Mr. Zechman was asked to confirm the procedure and laterality before marking the site ?Procedure checklist: Completed ?Consent: Before the procedure and under the influence of no sedative(s), amnesic(s), or anxiolytics, the patient was informed of the treatment options, risks and possible complications. To fulfill our ethical and legal obligations, as recommended by the American Medical Association's Code of Ethics, I have informed the patient of my clinical impression; the nature and purpose of the treatment or procedure; the risks, benefits, and possible complications of the intervention; the alternatives, including doing nothing; the risk(s) and benefit(s) of the alternative treatment(s) or procedure(s); and the risk(s) and benefit(s) of doing nothing. ?The patient was provided information about the general risks  and possible complications associated with the procedure. These may include, but are not limited to: failure to achieve desired goals, infection, bleeding, organ or nerve damage, allergic reactions, paralysis, and death. ?In addition,  the patient was informed of those risks and complications associated to the procedure, such as failure to decrease pain; infection; bleeding; organ or nerve damage with subsequent damage to sensory, motor, and/or autonomic systems, resulting in permanent pain, numbness, and/or weakness of one or several areas of the body; allergic reactions; (i.e.: anaphylactic reaction); and/or death. ?Furthermore, the patient was informed of those risks and complications associated with the medications. These include, but are not limited to: allergic reactions (i.e.: anaphylactic or anaphylactoid reaction(s)); adrenal axis suppression; blood sugar elevation that in diabetics may result in ketoacidosis or comma; water retention that in patients with history of congestive heart failure may result in shortness of breath, pulmonary edema, and decompensation with resultant heart failure; weight gain; swelling or edema; medication-induced neural toxicity; particulate matter embolism and blood vessel occlusion with resultant organ, and/or nervous system infarction; and/or aseptic necrosis of one or more joints. ?Finally, the patient was informed that Medicine is not an exact science; therefore, there is also the possibility of unforeseen or unpredictable risks and/or possible complications that may result in a catastrophic outcome. The patient indicated having understood very clearly. We have given the patient no guarantees and we have made no promises. Enough time was given to the patient to ask questions, all of which were answered to the patient's satisfaction. Mr. Gonyea has indicated that he wanted to continue with the procedure. ?Attestation: I, the ordering provider, attest that I have discussed with  the patient the benefits, risks, side-effects, alternatives, likelihood of achieving goals, and potential problems during recovery for the procedure that I have provided informed consent. ?Date  Time: 04/11/2021  9:14 AM ? ?Pre-Procedure Preparation:  ?Monitoring: As per clinic protocol. Respiration, ETCO2, SpO2, BP, heart rate and rhythm monitor placed and checked for adequate function ?Safety Precautions: Patient was assessed for positional comfort and pressure points before starting the procedure. ?Time-out: I initiated and conducted the "Time-out" before starting the procedure, as per protocol. The patient was asked to participate by confirming the accuracy of the "Time Out" information. Verification of the correct person, site, and procedure were performed and confirmed by me, the nursing staff, and the patient. "Time-out" conducted as per Joint Commission's Universal Protocol (UP.01.01.01). ?Time: 1013 ? ?Description of Procedure:          ?Area Prepped: Entire palmar and dorsal aspect of hand, up to forearm area. ?DuraPrep (Iodine Povacrylex [0.7% available iodine] and Isopropyl Alcohol, 74% w/w) ?Safety Precautions: Aspiration looking for blood return was conducted prior to all injections. At no point did we inject any substances, as a needle was being advanced. No attempts were made at seeking any paresthesias. Safe injection practices and needle disposal techniques used. Medications properly checked for expiration dates. SDV (single dose vial) medications used. ?Description of the Procedure: Protocol guidelines were followed. The patient was placed in position. The target area was identified and prepped in the usual manner. Skin & deeper tissues infiltrated with local anesthetic. Appropriate time provided for local anesthetics to take effect. The procedure needle was slowly advanced to target area. Proper needle placement secured. Negative aspiration confirmed. Solution injected in intermittent fashion,  asking for systemic symptoms every 0.5cc. Needle(s) removed and area cleaned, making sure to leave some prepping solution back to take advantage of its long term bactericidal properties. ? ?Vitals:  ? 03/27/

## 2021-04-11 NOTE — Patient Instructions (Signed)

## 2021-04-11 NOTE — Progress Notes (Signed)
Safety precautions to be maintained throughout the outpatient stay will include: orient to surroundings, keep bed in low position, maintain call bell within reach at all times, provide assistance with transfer out of bed and ambulation.  

## 2021-04-11 NOTE — Progress Notes (Signed)
PROVIDER NOTE: Interpretation of information contained herein should be left to medically-trained personnel. Specific patient instructions are provided elsewhere under "Patient Instructions" section of medical record. This document was created in part using STT-dictation technology, any transcriptional errors that may result from this process are unintentional.  ?Patient: [name] ?Type: Established ?DOB: January 05, 1956 ?MRN: [id number] ?PCP: Wayne Hauser, DO  Service: Procedure ?DOS: 04/11/2021 ?Setting: Ambulatory ?Location: Ambulatory outpatient facility ?Delivery: Face-to-face Provider: Gillis Santa, MD ?Specialty: Interventional Pain Management ?Specialty designation: 09 ?Location: Outpatient facility ?Ref. Prov.: Nobie Putnam *   ? ?Primary Reason for Visit: Interventional Pain Management Treatment. ?CC: Shoulder Pain (Bilateral, right trigger finger) ? ?  ?Procedure:          Anesthesia, Analgesia, Anxiolysis:  ?Type: Diagnostic Glenohumeral Joint (shoulder) Injection #1  ?Primary Purpose: Diagnostic ?Region: Superior Shoulder Area ?Level:  Shoulder ?Target Area: Glenohumeral Joint (shoulder) ?Approach: Anterior approach. ?Laterality: Bilateral  Anesthesia: Local (1-2% Lidocaine)  ?Anxiolysis: None  ?Sedation: None  ?Guidance: Fluoroscopy         ? ? ?Position: Supine  ? ?1. Localized osteoarthritis of shoulder regions, bilateral   ?2. Trigger finger of right hand, unspecified finger   ?3. Chronic pain of both shoulders   ?4. Sacroiliac joint pain   ? ?NAS-11 Pain score:  ? Pre-procedure: 8 /10  ? Post-procedure:  (2/ right    3/ left)/10  ? ?   ?Pre-op H&P Assessment:  ?[name] is a 66 y.o. (year old), male patient, seen today for interventional treatment. He  has a past surgical history that includes Knee surgery; Gastric bypass (2002); prostate seeding; Carpal tunnel release (Right, 12/23/2014); Carpal tunnel release (Left, 01/06/2015); Colonoscopy with propofol (N/A, 10/08/2017); and  Cardioversion (N/A, 05/15/2019). [name] has a current medication list which includes the following prescription(s): acetaminophen, amiodarone, amlodipine, aspirin, benazepril, buspirone, cyanocobalamin, fluticasone, furosemide, ibuprofen, ibuprofen, melatonin, metoprolol succinate, multivitamin with minerals, rivaroxaban, trazodone, and shingrix. His primarily concern today is the Shoulder Pain (Bilateral, right trigger finger) ? ?Initial Vital Signs:  ?Pulse/HCG Rate:  ?(!) 53 ?ECG Heart Rate: (!) 57 ?Temp: (!) 97.2 ?F (36.2 ?C) ?Resp: 16 ?BP: (!) 154/85 ?SpO2: 99 % ? ?BMI: Estimated body mass index is 44.89 kg/m? as calculated from the following: ?  Height as of this encounter: 6' (1.829 m). ?  Weight as of this encounter: 331 lb (150.1 kg). ? ?Risk Assessment: ?Allergies: Reviewed. He has No Known Allergies.  ?Allergy Precautions: None required ?Coagulopathies: Reviewed. None identified.  ?Blood-thinner therapy: None at this time ?Active Infection(s): Reviewed. None identified. [name] is afebrile ? ?Site Confirmation: [name] was asked to confirm the procedure and laterality before marking the site ?Procedure checklist: Completed ?Consent: Before the procedure and under the influence of no sedative(s), amnesic(s), or anxiolytics, the patient was informed of the treatment options, risks and possible complications. To fulfill our ethical and legal obligations, as recommended by the American Medical Association's Code of Ethics, I have informed the patient of my clinical impression; the nature and purpose of the treatment or procedure; the risks, benefits, and possible complications of the intervention; the alternatives, including doing nothing; the risk(s) and benefit(s) of the alternative treatment(s) or procedure(s); and the risk(s) and benefit(s) of doing nothing. ?The patient was provided information about the general risks and possible complications associated with the procedure. These may include,  but are not limited to: failure to achieve desired goals, infection, bleeding, organ or nerve damage, allergic reactions, paralysis, and death. ?In addition, the patient  was informed of those risks and complications associated to the procedure, such as failure to decrease pain; infection; bleeding; organ or nerve damage with subsequent damage to sensory, motor, and/or autonomic systems, resulting in permanent pain, numbness, and/or weakness of one or several areas of the body; allergic reactions; (i.e.: anaphylactic reaction); and/or death. ?Furthermore, the patient was informed of those risks and complications associated with the medications. These include, but are not limited to: allergic reactions (i.e.: anaphylactic or anaphylactoid reaction(s)); adrenal axis suppression; blood sugar elevation that in diabetics may result in ketoacidosis or comma; water retention that in patients with history of congestive heart failure may result in shortness of breath, pulmonary edema, and decompensation with resultant heart failure; weight gain; swelling or edema; medication-induced neural toxicity; particulate matter embolism and blood vessel occlusion with resultant organ, and/or nervous system infarction; and/or aseptic necrosis of one or more joints. ?Finally, the patient was informed that Medicine is not an exact science; therefore, there is also the possibility of unforeseen or unpredictable risks and/or possible complications that may result in a catastrophic outcome. The patient indicated having understood very clearly. We have given the patient no guarantees and we have made no promises. Enough time was given to the patient to ask questions, all of which were answered to the patient's satisfaction. [name] has indicated that he wanted to continue with the procedure. ?Attestation: I, the ordering provider, attest that I have discussed with the patient the benefits, risks, side-effects, alternatives, likelihood of  achieving goals, and potential problems during recovery for the procedure that I have provided informed consent. ?Date  Time: 04/11/2021  9:14 AM ? ?Pre-Procedure Preparation:  ?Monitoring: As per clinic protocol. Respiration, ETCO2, SpO2, BP, heart rate and rhythm monitor placed and checked for adequate function ?Safety Precautions: Patient was assessed for positional comfort and pressure points before starting the procedure. ?Time-out: I initiated and conducted the "Time-out" before starting the procedure, as per protocol. The patient was asked to participate by confirming the accuracy of the "Time Out" information. Verification of the correct person, site, and procedure were performed and confirmed by me, the nursing staff, and the patient. "Time-out" conducted as per Joint Commission's Universal Protocol (UP.01.01.01). ?Time: 1013 ? ?Description of Procedure:          ?Area Prepped: Entire shoulder Area ?DuraPrep (Iodine Povacrylex [0.7% available iodine] and Isopropyl Alcohol, 74% w/w) ?Safety Precautions: Aspiration looking for blood return was conducted prior to all injections. At no point did we inject any substances, as a needle was being advanced. No attempts were made at seeking any paresthesias. Safe injection practices and needle disposal techniques used. Medications properly checked for expiration dates. SDV (single dose vial) medications used. ?Description of the Procedure: Protocol guidelines were followed. The patient was placed in position over the procedure table. The target area was identified and the area prepped in the usual manner. Skin & deeper tissues infiltrated with local anesthetic. Appropriate amount of time allowed to pass for local anesthetics to take effect. The procedure needles were then advanced to the target area. Proper needle placement secured. Negative aspiration confirmed. Solution injected in intermittent fashion, asking for systemic symptoms every 0.5cc of injectate. The  needles were then removed and the area cleansed, making sure to leave some of the prepping solution back to take advantage of its long term bactericidal properties. ? ?      ? ?Vitals:  ? 04/11/21 7619 03/27

## 2021-04-12 ENCOUNTER — Telehealth: Payer: Self-pay | Admitting: *Deleted

## 2021-04-12 NOTE — Telephone Encounter (Signed)
No problems post procedure. 

## 2021-04-18 ENCOUNTER — Telehealth: Payer: Self-pay | Admitting: Student in an Organized Health Care Education/Training Program

## 2021-04-18 NOTE — Telephone Encounter (Signed)
Bubble sent to Dr. Holley Raring. ?

## 2021-04-19 ENCOUNTER — Ambulatory Visit
Payer: Medicare Other | Attending: Student in an Organized Health Care Education/Training Program | Admitting: Student in an Organized Health Care Education/Training Program

## 2021-04-19 DIAGNOSIS — M6530 Trigger finger, unspecified finger: Secondary | ICD-10-CM

## 2021-04-19 DIAGNOSIS — M25511 Pain in right shoulder: Secondary | ICD-10-CM

## 2021-04-19 DIAGNOSIS — M19012 Primary osteoarthritis, left shoulder: Secondary | ICD-10-CM

## 2021-04-19 DIAGNOSIS — G8929 Other chronic pain: Secondary | ICD-10-CM

## 2021-04-19 DIAGNOSIS — M19011 Primary osteoarthritis, right shoulder: Secondary | ICD-10-CM

## 2021-04-19 DIAGNOSIS — M25512 Pain in left shoulder: Secondary | ICD-10-CM

## 2021-04-19 DIAGNOSIS — M533 Sacrococcygeal disorders, not elsewhere classified: Secondary | ICD-10-CM

## 2021-04-19 DIAGNOSIS — G894 Chronic pain syndrome: Secondary | ICD-10-CM | POA: Diagnosis not present

## 2021-04-19 MED ORDER — HYDROCODONE-ACETAMINOPHEN 7.5-325 MG PO TABS: 1.0000 | ORAL_TABLET | Freq: Three times a day (TID) | ORAL | 0 refills | Status: AC | PRN

## 2021-04-19 NOTE — Progress Notes (Signed)
Patient: [name]  Service Category: E/M  Provider: Gillis Santa, MD  ?DOB: Jun 12, 1955  DOS: 04/19/2021  Location: Office  ?MRN: [id number]  Setting: Ambulatory outpatient  Referring Provider: Nobie Putnam *  ?Type: Established Patient  Specialty: Interventional Pain Management  PCP: Olin Hauser, DO  ?Location: Remote location  Delivery: TeleHealth    ? ?Virtual Encounter - Pain Management ?PROVIDER NOTE: Information contained herein reflects review and annotations entered in association with encounter. Interpretation of such information and data should be left to medically-trained personnel. Information provided to patient can be located elsewhere in the medical record under "Patient Instructions". Document created using STT-dictation technology, any transcriptional errors that may result from process are unintentional.  ?  ?Contact & Pharmacy ?Preferred: [phone] ?Home: [phone] (home) ?Mobile: [phone] (mobile) ?E-mail: [email or web address]  ?[facility] [address] [place], [place] [address] ?[address] [place] [address] ?Phone: [phone] Fax: [phone] ?  ?Pre-screening  ?Mr. Heiberger offered "in-person" vs "virtual" encounter. He indicated preferring virtual for this encounter.  ? ?Reason ?COVID-19*  Social distancing based on CDC and AMA recommendations.  ? ?I contacted [name] on 04/19/2021 via telephone.      I clearly identified myself as Gillis Santa, MD. I verified that I was speaking with the correct person using two identifiers (Name: YIDA HYAMS, and date of birth: [birth year]). ? ?Consent ?I sought verbal advanced consent from [name] for virtual visit interactions. I informed Mr. Dimmer of possible security and privacy concerns, risks, and limitations associated with providing "not-in-person" medical evaluation and management services. I also informed Mr. Kakos of the availability of "in-person" appointments.  Finally, I informed him that there would be a charge for the virtual visit and that he could be  personally, fully or partially, financially responsible for it. Mr. Salvucci expressed understanding and agreed to proceed.  ? ?Historic Elements   ?Mr. SAUD BAIL is a 66 y.o. year old, male patient evaluated today after our last contact on 04/18/2021. Mr. Poplaski  has a past medical history of Cardiomyopathy (in setting of Afib), DJD (degenerative joint disease) of knee, History of kidney stones, Intervertebral disc disorder with radiculopathy of lumbosacral region, Kidney stone (2012), Lower extremity edema, PAF (paroxysmal atrial fibrillation) ([place]), Pernicious anemia, Prostate cancer ([place]), Pulmonary nodule, right, and SVT (supraventricular tachycardia) ([place]). He also  has a past surgical history that includes Knee surgery; Gastric bypass (2002); prostate seeding; Carpal tunnel release (Right, 12/23/2014); Carpal tunnel release (Left, 01/06/2015); Colonoscopy with propofol (N/A, 10/08/2017); and Cardioversion (N/A, 05/15/2019). Mr. Kittel has a current medication list which includes the following prescription(s): hydrocodone-acetaminophen, acetaminophen, amiodarone, amlodipine, aspirin, benazepril, buspirone, cyanocobalamin, fluticasone, furosemide, ibuprofen, ibuprofen, melatonin, metoprolol succinate, multivitamin with minerals, rivaroxaban, and trazodone. He  reports that he quit smoking about 42 years ago. His smoking use included cigarettes. He has a 10.00 pack-year smoking history. He has quit using smokeless tobacco.  His smokeless tobacco use included snuff. He reports current alcohol use of about 12.0 standard drinks per week. He reports that he does not use drugs. Mr. Shrout has No Known Allergies.  ? ?HPI  ?Today, he is being contacted for medication management. ? ?Virtual visit today for medication refill.  Patient takes hydrocodone very sparingly and last refill was in January. ?He is finding benefit from his  trigger finger injection that was done a couple of weeks ago. ?Discussed referral to physical therapy for shoulder rehab.  Referral  placed below. ? ?Pharmacotherapy Assessment  ? ?Opioid Analgesic: Hydrocodone 7.5 mg TID PRN     ? ?Monitoring: ?[place] PMP: PDMP reviewed during this encounter.       ?Pharmacotherapy: No side-effects or adverse reactions reported. ?Compliance: No problems identified. ?Effectiveness: Clinically acceptable. ?Plan: Refer to "POC". UDS:  ?Summary  ?Date Value Ref Range Status  ?04/06/2020 Note  Final  ?  Comment:  ?  ==================================================================== ?[employer] Select 13 (MW) ?==================================================================== ?Test                             Result       Flag       Units ? ?Drug Present and Declared for Prescription Verification ?  Tramadol                       >4386        EXPECTED   ng/mg creat ?  O-Desmethyltramadol            3353         EXPECTED   ng/mg creat ?  N-Desmethyltramadol            >4386        EXPECTED   ng/mg creat ?   Source of tramadol is a prescription medication. O-desmethyltramadol ?   and N-desmethyltramadol are expected metabolites of tramadol. ? ?==================================================================== ?Test                      Result    Flag   Units      Ref Range ?  Creatinine              114              mg/dL      >=20 ?==================================================================== ?Declared Medications: ? The flagging and interpretation on this report are based on the ? following declared medications.  Unexpected results may arise from ? inaccuracies in the declared medications. ? ? **Note: The testing scope of this panel includes these medications: ? ? Tramadol ? ? **Note: The testing scope of this panel does not include the ? following reported medications: ? ? Acetaminophen (Tylenol) ? Amiodarone ? Amlodipine ? Benazepril ? Cyclobenzaprine (Flexeril) ? Ezetimibe ?  Fluticasone ? Furosemide ? Gabapentin ? Menthol ? Methyl Salicylate (topical) ? Metoprolol ? Mirtazapine ? Multivitamin ? Rivaroxaban ? Rosuvastatin ? Trazodone ? Vitamin B12 ?==================================================================== ?For clinical consultation, please call [phone]. ?==================================================================== ?  ?  ? ?Laboratory Chemistry Profile  ? ?Renal ?Lab Results  ?Component Value Date  ? BUN 17 07/21/2020  ? CREATININE 1.21 07/21/2020  ? BCR NOT APPLICABLE 85/88/5027  ? GFRAA 72 07/21/2020  ? GFRNONAA 62 07/21/2020  ?  Hepatic ?Lab Results  ?Component Value Date  ? AST 15 07/21/2020  ? ALT 11 07/21/2020  ? ALBUMIN 4.6 08/01/2019  ? ALKPHOS 72 08/01/2019  ? LIPASE 28 03/13/2017  ?  ?Electrolytes ?Lab Results  ?Component Value Date  ? NA 143 07/21/2020  ? K 4.2 07/21/2020  ? CL 108 07/21/2020  ? CALCIUM 9.0 07/21/2020  ? MG 2.0 02/01/2021  ?  Bone ?Lab Results  ?Component Value Date  ? 25OHVITD1 32 02/01/2021  ? 74JOINOM7 <1.0 02/01/2021  ? 25OHVITD3 32 02/01/2021  ?  ?Inflammation (CRP: Acute Phase) (ESR: Chronic Phase) ?No results found for: CRP, ESRSEDRATE, LATICACIDVEN    ?  ? ?Note: Above Lab results  reviewed. ? ?Imaging  ?DG Si Joints ?CLINICAL DATA:  Bilateral sacroiliac joint pain ? ?EXAM: ?BILATERAL SACROILIAC JOINTS - 3+ VIEW ? ?COMPARISON:  None. ? ?FINDINGS: ?Frontal and bilateral oblique views of the sacroiliac joints are ?obtained. Joint spaces are symmetrical. No erosive changes. There ?are no acute bony abnormalities. Prominent spondylosis at L4-5 and ?L5-S1. Brachytherapy seeds are seen within the prostate bed. ? ?IMPRESSION: ?1. Unremarkable sacroiliac joints. ?2. Lower lumbar spondylosis. ? ?Electronically Signed ?  By: Randa Ngo M.D. ?  On: 04/12/2021 19:09 ? ?Assessment  ?The primary encounter diagnosis was Localized osteoarthritis of shoulder regions, bilateral. Diagnoses of Trigger finger of right hand, unspecified finger,  Chronic pain of both shoulders, Sacroiliac joint pain, and Chronic pain syndrome were also pertinent to this visit. ? ?Plan of Care  ?Mr. Grier Rocher Cangemi has a current medication list which includes the fo

## 2021-04-20 ENCOUNTER — Telehealth: Payer: Medicare Other | Admitting: Student in an Organized Health Care Education/Training Program

## 2021-04-27 DIAGNOSIS — M3501 Sicca syndrome with keratoconjunctivitis: Secondary | ICD-10-CM | POA: Diagnosis not present

## 2021-04-28 ENCOUNTER — Telehealth: Payer: Self-pay

## 2021-04-28 DIAGNOSIS — M47816 Spondylosis without myelopathy or radiculopathy, lumbar region: Secondary | ICD-10-CM

## 2021-04-28 NOTE — Telephone Encounter (Signed)
He called in wanting a procedure on his low back. There are no orders for back, only shoulder and finger. Can you ask Dr. Holley Raring if he wants to do this or have him come in again. He was here 4/4  ?

## 2021-04-29 ENCOUNTER — Ambulatory Visit: Payer: Medicare Other | Attending: Student in an Organized Health Care Education/Training Program

## 2021-04-29 DIAGNOSIS — M19011 Primary osteoarthritis, right shoulder: Secondary | ICD-10-CM | POA: Diagnosis not present

## 2021-04-29 DIAGNOSIS — R293 Abnormal posture: Secondary | ICD-10-CM | POA: Insufficient documentation

## 2021-04-29 DIAGNOSIS — M6530 Trigger finger, unspecified finger: Secondary | ICD-10-CM | POA: Insufficient documentation

## 2021-04-29 DIAGNOSIS — M533 Sacrococcygeal disorders, not elsewhere classified: Secondary | ICD-10-CM | POA: Insufficient documentation

## 2021-04-29 DIAGNOSIS — G894 Chronic pain syndrome: Secondary | ICD-10-CM | POA: Insufficient documentation

## 2021-04-29 DIAGNOSIS — M25512 Pain in left shoulder: Secondary | ICD-10-CM | POA: Diagnosis not present

## 2021-04-29 DIAGNOSIS — M6281 Muscle weakness (generalized): Secondary | ICD-10-CM | POA: Diagnosis not present

## 2021-04-29 DIAGNOSIS — M19012 Primary osteoarthritis, left shoulder: Secondary | ICD-10-CM | POA: Insufficient documentation

## 2021-04-29 DIAGNOSIS — M25511 Pain in right shoulder: Secondary | ICD-10-CM | POA: Diagnosis not present

## 2021-04-29 DIAGNOSIS — M5459 Other low back pain: Secondary | ICD-10-CM | POA: Diagnosis not present

## 2021-04-29 DIAGNOSIS — G8929 Other chronic pain: Secondary | ICD-10-CM | POA: Diagnosis not present

## 2021-04-29 NOTE — Therapy (Signed)
?OUTPATIENT PHYSICAL THERAPY SHOULDER EVALUATION ? ? ?Patient Name: [name] ?MRN: [id number] ?DOB:12/01/1955, 66 y.o., male ?Today's Date: 04/29/2021 ? ? PT End of Session - 04/29/21 2376   ? ? Visit Number 1   ? Number of Visits 25   ? Date for PT Re-Evaluation 07/22/21   ? Authorization Type eval: 04/29/21   ? PT Start Time [phone]   ? PT Stop Time 0930   ? PT Time Calculation (min) 44 min   ? Activity Tolerance Patient tolerated treatment well   ? Behavior During Therapy [facility] for tasks assessed/performed   ? ?  ?  ? ?  ? ? ?Past Medical History:  ?Diagnosis Date  ? Cardiomyopathy (in setting of Afib)   ? a. 12/2013 Echo: EF 45-50%, mild ant and antsept HK. mild MR. Mod dil LA. nl RV fxn. Rhythm was Afib.  ? DJD (degenerative joint disease) of knee   ? History of kidney stones   ? Intervertebral disc disorder with radiculopathy of lumbosacral region   ? Kidney stone 2012  ? Lower extremity edema   ? PAF (paroxysmal atrial fibrillation) ([place])   ? Pernicious anemia   ? Prostate cancer ([place])   ? Pulmonary nodule, right   ? a. 10/2014 Cardiac CTA: 49m RLL nodule; b. 04/2015 CT Chest: stable 774mRLL nodule. No new nodules; 02/2017 CTA Chest: stable, benign, 78m74mLL pulm nodule.  ? SVT (supraventricular tachycardia) (HCCMartin ? ?Past Surgical History:  ?Procedure Laterality Date  ? CARDIOVERSION N/A 05/15/2019  ? Procedure: CARDIOVERSION;  Surgeon: GolMinna MerrittsD;  Location: [facility] ORS;  Service: Cardiovascular;  Laterality: N/A;  ? CARPAL TUNNEL RELEASE Right 12/23/2014  ? Procedure: CARPAL TUNNEL RELEASE;  Surgeon: HowEarnestine LeysD;  Location: [facility] ORS;  Service: Orthopedics;  Laterality: Right;  ? CARPAL TUNNEL RELEASE Left 01/06/2015  ? Procedure: CARPAL TUNNEL RELEASE;  Surgeon: HowEarnestine LeysD;  Location: [facility] ORS;  Service: Orthopedics;  Laterality: Left;  ? COLONOSCOPY WITH PROPOFOL N/A 10/08/2017  ? Procedure: COLONOSCOPY WITH PROPOFOL;  Surgeon: VanLin LandsmanD;  [facility];  Service:  Gastroenterology;  Laterality: N/A;  ? GASTRIC BYPASS  2002  ? KNEE SURGERY    ? knee trauma  ? prostate seeding    ? ?Patient Active Problem List  ? Diagnosis Date Noted  ? Localized osteoarthritis of shoulder regions, bilateral 03/29/2021  ? Chronic pain of both shoulders 03/29/2021  ? Drug-induced myopathy 02/21/2021  ? Trigger finger of right hand 11/15/2020  ? Prostate cancer (HCCManton7/11/2020  ? Insomnia 08/01/2019  ? Primary osteoarthritis of both wrists 02/17/2019  ? Trigger middle finger of left hand 02/17/2019  ? Chronic pain syndrome 02/17/2019  ? Chronic radicular lumbar pain 10/08/2018  ? Lumbar radiculopathy 10/08/2018  ? Lumbar degenerative disc disease 10/08/2018  ? Lumbar facet arthropathy 10/08/2018  ? Lumbar facet joint syndrome 10/08/2018  ? Intervertebral disc disorder with radiculopathy of lumbosacral region   ? Pernicious anemia 05/22/2016  ? Paroxysmal atrial fibrillation (HCCCandelero Arriba2/04/2015  ? DJD (degenerative joint disease) of knee 10/27/2014  ? Bilateral carpal tunnel syndrome 10/27/2014  ? Morbid obesity with BMI of 50.0-59.9, adult (HCCShorewood-[place]0/11/2014  ? Mixed hyperlipidemia 10/27/2014  ? H/O gastric bypass 03/20/2014  ? Hyperkalemia 03/20/2014  ? History of prostate cancer 12/26/2013  ? Essential hypertension 12/26/2013  ? Encounter for anticoagulation discussion and counseling 12/26/2013  ? ? ?PCP: KarOlin HauserO ? ?REFERRING PROVIDER: LatGillis SantaD ? ?REFERRING DIAGNOSIS:  M19.011,M19.012 (ICD-10-CM) - Localized osteoarthritis of shoulder regions, bilateral, M65.30 (ICD-10-CM) - Trigger finger of right hand, unspecified finger, M25.511,G89.29,M25.512 (ICD-10-CM) - Chronic pain of both shoulders, M53.3 (ICD-10-CM) - Sacroiliac joint pain, G89.4 (ICD-10-CM) - Chronic pain syndrome ? ?THERAPY DIAG: Chronic left shoulder pain ? ?Chronic right shoulder pain ? ?Abnormal posture ? ?Muscle weakness (generalized) ? ?Other low back pain ? ?ONSET DATE: 04/30/19 ? ?FOLLOW UP APPT WITH  PROVIDER: Yes  ? ? ?SUBJECTIVE:                                                                                                                                                                                        ? ?Chief Complaint: Bilateral shoulder pain ? ?Pertinent History ?[name] is a 66 y.o. year old, male patient referred to physical therapy by pain management for bilateral shoulder pain. Pt reports chronic bilateral shoulder pain for approximately the last 2 years. The pain started in his R wrist which improved after an injection however the pain gradually started worsening and then moving up his R arm to his R shoulder. Eventually he started having pain in his L shoulder as well. He noticed the most significant pain when lifting his arms to wash under them while bathing. He had injections in both shoulders on March 27th with significant improvement in pain since that time. At this point pt states that he is not having any shoulder pain. He has a past medical history of Cardiomyopathy (in setting of Afib), DJD (degenerative joint disease) of knee, History of kidney stones, Intervertebral disc disorder with radiculopathy of lumbosacral region, Kidney stone (2012), Lower extremity edema, PAF (paroxysmal atrial fibrillation) ([place]), Pernicious anemia, Prostate cancer ([place]), Pulmonary nodule, right, and SVT (supraventricular tachycardia) ([place]). He also  has a past surgical history that includes Knee surgery; Gastric bypass (2002); prostate seeding; Carpal tunnel release (Right, 12/23/2014); Carpal tunnel release (Left, 01/06/2015); Colonoscopy with propofol (N/A, 10/08/2017); and Cardioversion (N/A, 05/15/2019). He has tapered off a lot of his medications over the last few months because he felt like a lot of his symptoms were coming from medication side effects. He has also noted that he started having anxiety attacks since January which is new for him. Pt and his wife joined [employer]  and he recently ordered an upright walker with forearm supports to help his mobility. Pt reports losing 41# in the last 2 years secondary to drop in his calorie intake.  ? ? ?Pain:  ?Pain Intensity: Present: 0/10, Best: 0/10, Worst: 0/10 (not currently hurting) ?Pain location: Bilateral shoulders ?Pain Quality: sharp and aching  ?Radiating: No  ?Numbness/Tingling: Yes, bilateral hand  numbness occasionally as well as bilateral foot numbness.  ?Focal Weakness: No, however pt has noticed weakness in both shoulders and hands. ?Aggravating factors: Lifting arms; ?Relieving factors: injections, pain medication, no benefit with heat/topical creams, unable to take NSAIDs secondary to taking Xarelto ?24-hour pain behavior: Constant pain all the time (not currently); ?History of prior shoulder or neck injury, pain, surgery, or therapy: No ?Falls: Has patient fallen in last 6 months? No,  ?Dominant hand: right ?Imaging: Yes, R shoulder radiographs (03/29/21): Mild glenohumeral and mild acromioclavicular osteoarthritis; L shoulder radiographs (03/29/21):  Mild-to-moderate glenohumeral and mild acromioclavicular osteoarthritis. 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 Otherwise negative ? ?Precautions: None ? ?Weight Bearing Restrictions: No ? ?Living Environment ?Lives with: lives with their spouse ?Lives in: House/apartment ? ?Patient Goals: Improve shoulder strength and prevent pain from returning. Pt would also like to address his back pain/sciatica; ? ? ?OBJECTIVE:  ? ?Patient Surveys  ?FOTO: 50, predicted improvement to  62 ? ?Cognition ?Patient is oriented to person, place, and time.  ?Recent memory is intact.  ?Remote memory is intact.  ?Attention span and concentration are intact.  ?Expressive speech is intact.  ?Patient's fund of knowledge i

## 2021-04-29 NOTE — Therapy (Signed)
?OUTPATIENT PHYSICAL THERAPY SHOULDER TREATMENT ? ? ?Patient Name: [name] ?MRN: [id number] ?DOB:22-Jul-1955, 66 y.o., male ?Today's Date: 05/02/2021 ? ? PT End of Session - 05/02/21 1404   ? ? Visit Number 2   ? Number of Visits 25   ? Date for PT Re-Evaluation 07/22/21   ? Authorization Type eval: 04/29/21   ? PT Start Time 1400   ? PT Stop Time 1441   ? PT Time Calculation (min) 41 min   ? Activity Tolerance Patient tolerated treatment well   ? Behavior During Therapy [facility] for tasks assessed/performed   ? ?  ?  ? ?  ? ? ? ?Past Medical History:  ?Diagnosis Date  ? Cardiomyopathy (in setting of Afib)   ? a. 12/2013 Echo: EF 45-50%, mild ant and antsept HK. mild MR. Mod dil LA. nl RV fxn. Rhythm was Afib.  ? DJD (degenerative joint disease) of knee   ? History of kidney stones   ? Intervertebral disc disorder with radiculopathy of lumbosacral region   ? Kidney stone 2012  ? Lower extremity edema   ? PAF (paroxysmal atrial fibrillation) ([place])   ? Pernicious anemia   ? Prostate cancer ([place])   ? Pulmonary nodule, right   ? a. 10/2014 Cardiac CTA: 72m RLL nodule; b. 04/2015 CT Chest: stable 72mRLL nodule. No new nodules; 02/2017 CTA Chest: stable, benign, 34m21mLL pulm nodule.  ? SVT (supraventricular tachycardia) (HCCIron ? ?Past Surgical History:  ?Procedure Laterality Date  ? CARDIOVERSION N/A 05/15/2019  ? Procedure: CARDIOVERSION;  Surgeon: GolMinna MerrittsD;  Location: [facility] OR[facility];  Service: Cardiovascular;  Laterality: N/A;  ? CARPAL TUNNEL RELEASE Right 12/23/2014  ? Procedure: CARPAL TUNNEL RELEASE;  Surgeon: HowEarnestine LeysD;  Location: [facility] ORS;  Service: Orthopedics;  Laterality: Right;  ? CARPAL TUNNEL RELEASE Left 01/06/2015  ? Procedure: CARPAL TUNNEL RELEASE;  Surgeon: HowEarnestine LeysD;  Location: [facility] OR[facility];  Service: Orthopedics;  Laterality: Left;  ? COLONOSCOPY WITH PROPOFOL N/A 10/08/2017  ? Procedure: COLONOSCOPY WITH PROPOFOL;  Surgeon: VanLin LandsmanD;  [facility];  Service:  Gastroenterology;  Laterality: N/A;  ? GASTRIC BYPASS  2002  ? KNEE SURGERY    ? knee trauma  ? prostate seeding    ? ?Patient Active Problem List  ? Diagnosis Date Noted  ? Localized osteoarthritis of shoulder regions, bilateral 03/29/2021  ? Chronic pain of both shoulders 03/29/2021  ? Drug-induced myopathy 02/21/2021  ? Trigger finger of right hand 11/15/2020  ? Prostate cancer (HCCColony7/11/2020  ? Insomnia 08/01/2019  ? Primary osteoarthritis of both wrists 02/17/2019  ? Trigger middle finger of left hand 02/17/2019  ? Chronic pain syndrome 02/17/2019  ? Chronic radicular lumbar pain 10/08/2018  ? Lumbar radiculopathy 10/08/2018  ? Lumbar degenerative disc disease 10/08/2018  ? Lumbar facet arthropathy 10/08/2018  ? Lumbar facet joint syndrome 10/08/2018  ? Intervertebral disc disorder with radiculopathy of lumbosacral region   ? Pernicious anemia 05/22/2016  ? Paroxysmal atrial fibrillation (HCCHonaunau-N[place]2/04/2015  ? DJD (degenerative joint disease) of knee 10/27/2014  ? Bilateral carpal tunnel syndrome 10/27/2014  ? Morbid obesity with BMI of 50.0-59.9, adult (HCCCra[place]0/11/2014  ? Mixed hyperlipidemia 10/27/2014  ? H/O gastric bypass 03/20/2014  ? Hyperkalemia 03/20/2014  ? History of prostate cancer 12/26/2013  ? Essential hypertension 12/26/2013  ? Encounter for anticoagulation discussion and counseling 12/26/2013  ? ? ?PCP: KarOlin HauserO ? ?REFERRING PROVIDER: LatGillis SantaD ? ?REFERRING  DIAGNOSIS: M19.011,M19.012 (ICD-10-CM) - Localized osteoarthritis of shoulder regions, bilateral, M65.30 (ICD-10-CM) - Trigger finger of right hand, unspecified finger, M25.511,G89.29,M25.512 (ICD-10-CM) - Chronic pain of both shoulders, M53.3 (ICD-10-CM) - Sacroiliac joint pain, G89.4 (ICD-10-CM) - Chronic pain syndrome ? ?THERAPY DIAG: Chronic left shoulder pain ? ?Chronic right shoulder pain ? ?Abnormal posture ? ?Muscle weakness (generalized) ? ?ONSET DATE: 04/30/19 ? ?FOLLOW UP APPT WITH PROVIDER: Yes  ? ? ?From  initial evaluation (04/29/21) ? ?SUBJECTIVE:                                                                                                                                                                                        ? ?Chief Complaint: Bilateral shoulder pain ? ?Pertinent History ?[name] is a 66 y.o. year old, male patient referred to physical therapy by pain management for bilateral shoulder pain. Pt reports chronic bilateral shoulder pain for approximately the last 2 years. The pain started in his R wrist which improved after an injection however the pain gradually started worsening and then moving up his R arm to his R shoulder. Eventually he started having pain in his L shoulder as well. He noticed the most significant pain when lifting his arms to wash under them while bathing. He had injections in both shoulders on March 27th with significant improvement in pain since that time. At this point pt states that he is not having any shoulder pain. He has a past medical history of Cardiomyopathy (in setting of Afib), DJD (degenerative joint disease) of knee, History of kidney stones, Intervertebral disc disorder with radiculopathy of lumbosacral region, Kidney stone (2012), Lower extremity edema, PAF (paroxysmal atrial fibrillation) ([place]), Pernicious anemia, Prostate cancer ([place]), Pulmonary nodule, right, and SVT (supraventricular tachycardia) ([place]). He also  has a past surgical history that includes Knee surgery; Gastric bypass (2002); prostate seeding; Carpal tunnel release (Right, 12/23/2014); Carpal tunnel release (Left, 01/06/2015); Colonoscopy with propofol (N/A, 10/08/2017); and Cardioversion (N/A, 05/15/2019). He has tapered off a lot of his medications over the last few months because he felt like a lot of his symptoms were coming from medication side effects. He has also noted that he started having anxiety attacks since January which is new for him. Pt and his wife joined [employer] and he recently ordered an upright walker with forearm supports to help his mobility. Pt reports losing 41# in the last 2 years secondary to drop in his calorie intake.  ? ? ?Pain:  ?Pain Intensity: Present: 0/10, Best: 0/10, Worst: 0/10 (not currently hurting) ?Pain location: Bilateral shoulders ?Pain Quality: sharp and aching  ?Radiating: No  ?Numbness/Tingling: Yes, bilateral  hand numbness occasionally as well as bilateral foot numbness.  ?Focal Weakness: No, however pt has noticed weakness in both shoulders and hands. ?Aggravating factors: Lifting arms; ?Relieving factors: injections, pain medication, no benefit with heat/topical creams, unable to take NSAIDs secondary to taking Xarelto ?24-hour pain behavior: Constant pain all the time (not currently); ?History of prior shoulder or neck injury, pain, surgery, or therapy: No ?Falls: Has patient fallen in last 6 months? No,  ?Dominant hand: right ?Imaging: Yes, R shoulder radiographs (03/29/21): Mild glenohumeral and mild acromioclavicular osteoarthritis; L shoulder radiographs (03/29/21):  Mild-to-moderate glenohumeral and mild acromioclavicular osteoarthritis. 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 Otherwise negative ? ?Precautions: None ? ?Weight Bearing Restrictions: No ? ?Living Environment ?Lives with: lives with their spouse ?Lives in: House/apartment ? ?Patient Goals: Improve shoulder strength and prevent pain from returning. Pt would also like to address his back pain/sciatica; ? ? ?OBJECTIVE:  ? ?Patient Surveys  ?FOTO: 50, predicted improvement to  62 ? ?Cognition ?Patient is oriented to person, place, and time.  ?Recent memory is intact.  ?Remote memory is intact.  ?Attention span and concentration are intact.  ?Expressive speech is intact.  ?Patient's fund

## 2021-05-02 ENCOUNTER — Other Ambulatory Visit: Payer: Self-pay

## 2021-05-02 ENCOUNTER — Ambulatory Visit: Payer: Medicare Other

## 2021-05-02 DIAGNOSIS — R293 Abnormal posture: Secondary | ICD-10-CM

## 2021-05-02 DIAGNOSIS — G8929 Other chronic pain: Secondary | ICD-10-CM

## 2021-05-02 DIAGNOSIS — M19012 Primary osteoarthritis, left shoulder: Secondary | ICD-10-CM | POA: Diagnosis not present

## 2021-05-02 DIAGNOSIS — M19011 Primary osteoarthritis, right shoulder: Secondary | ICD-10-CM | POA: Diagnosis not present

## 2021-05-02 DIAGNOSIS — M533 Sacrococcygeal disorders, not elsewhere classified: Secondary | ICD-10-CM | POA: Diagnosis not present

## 2021-05-02 DIAGNOSIS — G894 Chronic pain syndrome: Secondary | ICD-10-CM | POA: Diagnosis not present

## 2021-05-02 DIAGNOSIS — M25511 Pain in right shoulder: Secondary | ICD-10-CM | POA: Diagnosis not present

## 2021-05-02 DIAGNOSIS — M5459 Other low back pain: Secondary | ICD-10-CM | POA: Diagnosis not present

## 2021-05-02 DIAGNOSIS — M6281 Muscle weakness (generalized): Secondary | ICD-10-CM | POA: Diagnosis not present

## 2021-05-02 DIAGNOSIS — M6530 Trigger finger, unspecified finger: Secondary | ICD-10-CM | POA: Diagnosis not present

## 2021-05-02 DIAGNOSIS — M25512 Pain in left shoulder: Secondary | ICD-10-CM | POA: Diagnosis not present

## 2021-05-02 MED ORDER — PREDNISONE 20 MG PO TABS: ORAL_TABLET | ORAL | 0 refills | Status: AC

## 2021-05-02 NOTE — Telephone Encounter (Signed)
Talked with patient. States he would rather go with the Steroid taper. His pharmacy is [facility], Instructed that Dr Holley Raring will send in taper. Patient with understanding and call if needed. ?

## 2021-05-02 NOTE — Telephone Encounter (Signed)
Called and notified patient.

## 2021-05-04 ENCOUNTER — Telehealth: Payer: Self-pay

## 2021-05-04 ENCOUNTER — Ambulatory Visit: Payer: Medicare Other

## 2021-05-04 DIAGNOSIS — M5459 Other low back pain: Secondary | ICD-10-CM | POA: Diagnosis not present

## 2021-05-04 DIAGNOSIS — G894 Chronic pain syndrome: Secondary | ICD-10-CM | POA: Diagnosis not present

## 2021-05-04 DIAGNOSIS — M25512 Pain in left shoulder: Secondary | ICD-10-CM | POA: Diagnosis not present

## 2021-05-04 DIAGNOSIS — G8929 Other chronic pain: Secondary | ICD-10-CM

## 2021-05-04 DIAGNOSIS — M533 Sacrococcygeal disorders, not elsewhere classified: Secondary | ICD-10-CM | POA: Diagnosis not present

## 2021-05-04 DIAGNOSIS — M19012 Primary osteoarthritis, left shoulder: Secondary | ICD-10-CM | POA: Diagnosis not present

## 2021-05-04 DIAGNOSIS — M25511 Pain in right shoulder: Secondary | ICD-10-CM | POA: Diagnosis not present

## 2021-05-04 DIAGNOSIS — M6530 Trigger finger, unspecified finger: Secondary | ICD-10-CM | POA: Diagnosis not present

## 2021-05-04 DIAGNOSIS — R293 Abnormal posture: Secondary | ICD-10-CM | POA: Diagnosis not present

## 2021-05-04 DIAGNOSIS — M6281 Muscle weakness (generalized): Secondary | ICD-10-CM | POA: Diagnosis not present

## 2021-05-04 DIAGNOSIS — M19011 Primary osteoarthritis, right shoulder: Secondary | ICD-10-CM | POA: Diagnosis not present

## 2021-05-04 NOTE — Therapy (Signed)
?OUTPATIENT PHYSICAL THERAPY SHOULDER/BACK TREATMENT ? ? ?Patient Name: [name] ?MRN: [id number] ?DOB:04/18/1955, 66 y.o., male ?Today's Date: 05/07/2021 ? ? PT End of Session - 05/07/21 1124   ? ? Visit Number 3   ? Number of Visits 25   ? Date for PT Re-Evaluation 07/22/21   ? Authorization Type eval: 04/29/21   ? PT Start Time 1355   ? PT Stop Time 1440   ? PT Time Calculation (min) 45 min   ? Activity Tolerance Patient tolerated treatment well   ? Behavior During Therapy [facility] for tasks assessed/performed   ? ?  ?  ? ?  ? ? ? ? ?Past Medical History:  ?Diagnosis Date  ? Cardiomyopathy (in setting of Afib)   ? a. 12/2013 Echo: EF 45-50%, mild ant and antsept HK. mild MR. Mod dil LA. nl RV fxn. Rhythm was Afib.  ? DJD (degenerative joint disease) of knee   ? History of kidney stones   ? Intervertebral disc disorder with radiculopathy of lumbosacral region   ? Kidney stone 2012  ? Lower extremity edema   ? PAF (paroxysmal atrial fibrillation) ([place])   ? Pernicious anemia   ? Prostate cancer ([place])   ? Pulmonary nodule, right   ? a. 10/2014 Cardiac CTA: 58m RLL nodule; b. 04/2015 CT Chest: stable 762mRLL nodule. No new nodules; 02/2017 CTA Chest: stable, benign, 93m22mLL pulm nodule.  ? SVT (supraventricular tachycardia) ([place] ? ?Past Surgical History:  ?Procedure Laterality Date  ? CARDIOVERSION N/A 05/15/2019  ? Procedure: CARDIOVERSION;  Surgeon: GolMinna MerrittsD;  Location: [facility] ORS;  Service: Cardiovascular;  Laterality: N/A;  ? CARPAL TUNNEL RELEASE Right 12/23/2014  ? Procedure: CARPAL TUNNEL RELEASE;  Surgeon: HowEarnestine LeysD;  Location: [facility] ORS;  Service: Orthopedics;  Laterality: Right;  ? CARPAL TUNNEL RELEASE Left 01/06/2015  ? Procedure: CARPAL TUNNEL RELEASE;  Surgeon: HowEarnestine LeysD;  Location: [facility] ORS;  Service: Orthopedics;  Laterality: Left;  ? COLONOSCOPY WITH PROPOFOL N/A 10/08/2017  ? Procedure: COLONOSCOPY WITH PROPOFOL;  Surgeon: VanLin LandsmanD;  [facility];   Service: Gastroenterology;  Laterality: N/A;  ? GASTRIC BYPASS  2002  ? KNEE SURGERY    ? knee trauma  ? prostate seeding    ? ?Patient Active Problem List  ? Diagnosis Date Noted  ? Localized osteoarthritis of shoulder regions, bilateral 03/29/2021  ? Chronic pain of both shoulders 03/29/2021  ? Drug-induced myopathy 02/21/2021  ? Trigger finger of right hand 11/15/2020  ? Prostate cancer ([place]CC[place]7/11/2020  ? Insomnia 08/01/2019  ? Primary osteoarthritis of both wrists 02/17/2019  ? Trigger middle finger of left hand 02/17/2019  ? Chronic pain syndrome 02/17/2019  ? Chronic radicular lumbar pain 10/08/2018  ? Lumbar radiculopathy 10/08/2018  ? Lumbar degenerative disc disease 10/08/2018  ? Lumbar facet arthropathy 10/08/2018  ? Lumbar facet joint syndrome 10/08/2018  ? Intervertebral disc disorder with radiculopathy of lumbosacral region   ? Pernicious anemia 05/22/2016  ? Paroxysmal atrial fibrillation (HCCSu[place]un City2/04/2015  ? DJD (degenerative joint disease) of knee 10/27/2014  ? Bilateral carpal tunnel syndrome 10/27/2014  ? Morbid obesity with BMI of 50.0-59.9, adult (HCCChe[place]0/11/2014  ? Mixed hyperlipidemia 10/27/2014  ? H/O gastric bypass 03/20/2014  ? Hyperkalemia 03/20/2014  ? History of prostate cancer 12/26/2013  ? Essential hypertension 12/26/2013  ? Encounter for anticoagulation discussion and counseling 12/26/2013  ? ? ?PCP: KarOlin HauserO ? ?REFERRING PROVIDER: LatGillis SantaD ? ?  REFERRING DIAGNOSIS: M19.011,M19.012 (ICD-10-CM) - Localized osteoarthritis of shoulder regions, bilateral, M65.30 (ICD-10-CM) - Trigger finger of right hand, unspecified finger, M25.511,G89.29,M25.512 (ICD-10-CM) - Chronic pain of both shoulders, M53.3 (ICD-10-CM) - Sacroiliac joint pain, G89.4 (ICD-10-CM) - Chronic pain syndrome ? ?THERAPY DIAG: Chronic left shoulder pain ? ?Chronic right shoulder pain ? ?Other low back pain ? ?ONSET DATE: 04/30/19 ? ?FOLLOW UP APPT WITH PROVIDER: Yes  ? ? ?From initial evaluation  (04/29/21) ? ?SUBJECTIVE:                                                                                                                                                                                        ? ?Chief Complaint: Bilateral shoulder pain ? ?Pertinent History ?[name] is a 66 y.o. year old, male patient referred to physical therapy by pain management for bilateral shoulder pain. Pt reports chronic bilateral shoulder pain for approximately the last 2 years. The pain started in his R wrist which improved after an injection however the pain gradually started worsening and then moving up his R arm to his R shoulder. Eventually he started having pain in his L shoulder as well. He noticed the most significant pain when lifting his arms to wash under them while bathing. He had injections in both shoulders on March 27th with significant improvement in pain since that time. At this point pt states that he is not having any shoulder pain. He has a past medical history of Cardiomyopathy (in setting of Afib), DJD (degenerative joint disease) of knee, History of kidney stones, Intervertebral disc disorder with radiculopathy of lumbosacral region, Kidney stone (2012), Lower extremity edema, PAF (paroxysmal atrial fibrillation) ([place]), Pernicious anemia, Prostate cancer ([place]), Pulmonary nodule, right, and SVT (supraventricular tachycardia) ([place]). He also  has a past surgical history that includes Knee surgery; Gastric bypass (2002); prostate seeding; Carpal tunnel release (Right, 12/23/2014); Carpal tunnel release (Left, 01/06/2015); Colonoscopy with propofol (N/A, 10/08/2017); and Cardioversion (N/A, 05/15/2019). He has tapered off a lot of his medications over the last few months because he felt like a lot of his symptoms were coming from medication side effects. He has also noted that he started having anxiety attacks since January which is new for him. Pt and his wife joined [employer] and he  recently ordered an upright walker with forearm supports to help his mobility. Pt reports losing 41# in the last 2 years secondary to drop in his calorie intake.  ? ? ?Pain:  ?Pain Intensity: Present: 0/10, Best: 0/10, Worst: 0/10 (not currently hurting) ?Pain location: Bilateral shoulders ?Pain Quality: sharp and aching  ?Radiating: No  ?Numbness/Tingling: Yes, bilateral hand  numbness occasionally as well as bilateral foot numbness.  ?Focal Weakness: No, however pt has noticed weakness in both shoulders and hands. ?Aggravating factors: Lifting arms; ?Relieving factors: injections, pain medication, no benefit with heat/topical creams, unable to take NSAIDs secondary to taking Xarelto ?24-hour pain behavior: Constant pain all the time (not currently); ?History of prior shoulder or neck injury, pain, surgery, or therapy: No ?Falls: Has patient fallen in last 6 months? No,  ?Dominant hand: right ?Imaging: Yes, R shoulder radiographs (03/29/21): Mild glenohumeral and mild acromioclavicular osteoarthritis; L shoulder radiographs (03/29/21):  Mild-to-moderate glenohumeral and mild acromioclavicular osteoarthritis. 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 Otherwise negative ? ?Precautions: None ? ?Weight Bearing Restrictions: No ? ?Living Environment ?Lives with: lives with their spouse ?Lives in: House/apartment ? ?Patient Goals: Improve shoulder strength and prevent pain from returning. Pt would also like to address his back pain/sciatica; ? ? ?OBJECTIVE:  ? ?Patient Surveys  ?FOTO: 50, predicted improvement to 62 ? ?Cognition ?Patient is  oriented to person, place, and time.  ?Recent memory is intact.  ?Remote memory is intact.  ?Attention span and concentration are intact.  ?Expressive speech is intact.  ?Patient's fund of knowledge is within

## 2021-05-04 NOTE — Telephone Encounter (Signed)
Copied from [place] [id number]. Topic: General - Inquiry ?>> May 04, 2021  3:04 PM Scherrie Gerlach wrote: ?Reason for CRM: pt would like Dr Raliegh Ip to take a look at his MRi of lumber spine done 09/12/2018. There is sclerotic lesion in the L2 Vertebrate body. When PT noticed it today, he brought it pt's attention. The physical therapist advised pt he would bring it to Dr Holley Raring attention as well. He wants Dr Raliegh Ip to advise on this issue. ?(FYI: 12/09/2012 pt has 67 implants into his prostate.)   ?Pt has scheduled a virtual appt on 4/25 at 4 pm to get Dr Raliegh Ip opinion on this. ?

## 2021-05-05 ENCOUNTER — Telehealth: Payer: Self-pay | Admitting: Student in an Organized Health Care Education/Training Program

## 2021-05-05 NOTE — Telephone Encounter (Signed)
Patient concerned about the report from MRI of L. Spine done 08/2018 that was never discussed ?

## 2021-05-05 NOTE — Telephone Encounter (Signed)
Patient stated he went to physical therapy on 05-04-21. The therapy review his xray that Dr. Holley Raring had request. Patient has some concerns and stated that therapy were going to msg Dr. Holley Raring. Please give patient a call. Thanks ?

## 2021-05-09 ENCOUNTER — Telehealth: Payer: Medicare Other | Admitting: Student in an Organized Health Care Education/Training Program

## 2021-05-09 ENCOUNTER — Ambulatory Visit: Payer: Medicare Other

## 2021-05-09 DIAGNOSIS — M19011 Primary osteoarthritis, right shoulder: Secondary | ICD-10-CM | POA: Diagnosis not present

## 2021-05-09 DIAGNOSIS — M6530 Trigger finger, unspecified finger: Secondary | ICD-10-CM | POA: Diagnosis not present

## 2021-05-09 DIAGNOSIS — M25511 Pain in right shoulder: Secondary | ICD-10-CM | POA: Diagnosis not present

## 2021-05-09 DIAGNOSIS — M5459 Other low back pain: Secondary | ICD-10-CM

## 2021-05-09 DIAGNOSIS — M6281 Muscle weakness (generalized): Secondary | ICD-10-CM | POA: Diagnosis not present

## 2021-05-09 DIAGNOSIS — G894 Chronic pain syndrome: Secondary | ICD-10-CM | POA: Diagnosis not present

## 2021-05-09 DIAGNOSIS — M25512 Pain in left shoulder: Secondary | ICD-10-CM | POA: Diagnosis not present

## 2021-05-09 DIAGNOSIS — G8929 Other chronic pain: Secondary | ICD-10-CM

## 2021-05-09 DIAGNOSIS — M533 Sacrococcygeal disorders, not elsewhere classified: Secondary | ICD-10-CM | POA: Diagnosis not present

## 2021-05-09 DIAGNOSIS — M19012 Primary osteoarthritis, left shoulder: Secondary | ICD-10-CM | POA: Diagnosis not present

## 2021-05-09 DIAGNOSIS — R293 Abnormal posture: Secondary | ICD-10-CM | POA: Diagnosis not present

## 2021-05-09 NOTE — Therapy (Signed)
?OUTPATIENT PHYSICAL THERAPY SHOULDER/BACK TREATMENT ? ? ?Patient Name: [name] ?MRN: [id number] ?DOB:02-Jun-1955, 66 y.o., male ?Today's Date: 05/09/2021 ? ? PT End of Session - 05/09/21 1349   ? ? Visit Number 4   ? Number of Visits 25   ? Date for PT Re-Evaluation 07/22/21   ? Authorization Type eval: 04/29/21   ? PT Start Time 1350   ? PT Stop Time 1435   ? PT Time Calculation (min) 45 min   ? Activity Tolerance Patient tolerated treatment well   ? Behavior During Therapy [facility] for tasks assessed/performed   ? ?  ?  ? ?  ? ? ? ? ? ?Past Medical History:  ?Diagnosis Date  ? Cardiomyopathy (in setting of Afib)   ? a. 12/2013 Echo: EF 45-50%, mild ant and antsept HK. mild MR. Mod dil LA. nl RV fxn. Rhythm was Afib.  ? DJD (degenerative joint disease) of knee   ? History of kidney stones   ? Intervertebral disc disorder with radiculopathy of lumbosacral region   ? Kidney stone 2012  ? Lower extremity edema   ? PAF (paroxysmal atrial fibrillation) ([place])   ? Pernicious anemia   ? Prostate cancer ([place])   ? Pulmonary nodule, right   ? a. 10/2014 Cardiac CTA: 50m RLL nodule; b. 04/2015 CT Chest: stable 744mRLL nodule. No new nodules; 02/2017 CTA Chest: stable, benign, 72m70mLL pulm nodule.  ? SVT (supraventricular tachycardia) ([place] ? ?Past Surgical History:  ?Procedure Laterality Date  ? CARDIOVERSION N/A 05/15/2019  ? Procedure: CARDIOVERSION;  Surgeon: GolMinna MerrittsD;  Location: [facility] ORS;  Service: Cardiovascular;  Laterality: N/A;  ? CARPAL TUNNEL RELEASE Right 12/23/2014  ? Procedure: CARPAL TUNNEL RELEASE;  Surgeon: HowEarnestine LeysD;  Location: [facility] ORS;  Service: Orthopedics;  Laterality: Right;  ? CARPAL TUNNEL RELEASE Left 01/06/2015  ? Procedure: CARPAL TUNNEL RELEASE;  Surgeon: HowEarnestine LeysD;  Location: [facility] OR[facility];  Service: Orthopedics;  Laterality: Left;  ? COLONOSCOPY WITH PROPOFOL N/A 10/08/2017  ? Procedure: COLONOSCOPY WITH PROPOFOL;  Surgeon: VanLin LandsmanD;  [facility];   Service: Gastroenterology;  Laterality: N/A;  ? GASTRIC BYPASS  2002  ? KNEE SURGERY    ? knee trauma  ? prostate seeding    ? ?Patient Active Problem List  ? Diagnosis Date Noted  ? Localized osteoarthritis of shoulder regions, bilateral 03/29/2021  ? Chronic pain of both shoulders 03/29/2021  ? Drug-induced myopathy 02/21/2021  ? Trigger finger of right hand 11/15/2020  ? Prostate cancer ([place]CC[place]7/11/2020  ? Insomnia 08/01/2019  ? Primary osteoarthritis of both wrists 02/17/2019  ? Trigger middle finger of left hand 02/17/2019  ? Chronic pain syndrome 02/17/2019  ? Chronic radicular lumbar pain 10/08/2018  ? Lumbar radiculopathy 10/08/2018  ? Lumbar degenerative disc disease 10/08/2018  ? Lumbar facet arthropathy 10/08/2018  ? Lumbar facet joint syndrome 10/08/2018  ? Intervertebral disc disorder with radiculopathy of lumbosacral region   ? Pernicious anemia 05/22/2016  ? Paroxysmal atrial fibrillation (HCC[place]2/04/2015  ? DJD (degenerative joint disease) of knee 10/27/2014  ? Bilateral carpal tunnel syndrome 10/27/2014  ? Morbid obesity with BMI of 50.0-59.9, adult (HCCClark's [place]0/11/2014  ? Mixed hyperlipidemia 10/27/2014  ? H/O gastric bypass 03/20/2014  ? Hyperkalemia 03/20/2014  ? History of prostate cancer 12/26/2013  ? Essential hypertension 12/26/2013  ? Encounter for anticoagulation discussion and counseling 12/26/2013  ? ? ?PCP: KarOlin HauserO ? ?REFERRING PROVIDER: LatGillis SantaD ? ?  REFERRING DIAGNOSIS: M19.011,M19.012 (ICD-10-CM) - Localized osteoarthritis of shoulder regions, bilateral, M65.30 (ICD-10-CM) - Trigger finger of right hand, unspecified finger, M25.511,G89.29,M25.512 (ICD-10-CM) - Chronic pain of both shoulders, M53.3 (ICD-10-CM) - Sacroiliac joint pain, G89.4 (ICD-10-CM) - Chronic pain syndrome ? ?THERAPY DIAG: Chronic left shoulder pain ? ?Chronic right shoulder pain ? ?Other low back pain ? ?Muscle weakness (generalized) ? ?ONSET DATE: 04/30/19 ? ?FOLLOW UP APPT WITH PROVIDER:  Yes  ? ? ?From initial evaluation (04/29/21) ? ?SUBJECTIVE:                                                                                                                                                                                        ? ?Chief Complaint: Bilateral shoulder pain ? ?Pertinent History ?[name] is a 66 y.o. year old, male patient referred to physical therapy by pain management for bilateral shoulder pain. Pt reports chronic bilateral shoulder pain for approximately the last 2 years. The pain started in his R wrist which improved after an injection however the pain gradually started worsening and then moving up his R arm to his R shoulder. Eventually he started having pain in his L shoulder as well. He noticed the most significant pain when lifting his arms to wash under them while bathing. He had injections in both shoulders on March 27th with significant improvement in pain since that time. At this point pt states that he is not having any shoulder pain. He has a past medical history of Cardiomyopathy (in setting of Afib), DJD (degenerative joint disease) of knee, History of kidney stones, Intervertebral disc disorder with radiculopathy of lumbosacral region, Kidney stone (2012), Lower extremity edema, PAF (paroxysmal atrial fibrillation) ([place]), Pernicious anemia, Prostate cancer ([place]), Pulmonary nodule, right, and SVT (supraventricular tachycardia) ([place]). He also  has a past surgical history that includes Knee surgery; Gastric bypass (2002); prostate seeding; Carpal tunnel release (Right, 12/23/2014); Carpal tunnel release (Left, 01/06/2015); Colonoscopy with propofol (N/A, 10/08/2017); and Cardioversion (N/A, 05/15/2019). He has tapered off a lot of his medications over the last few months because he felt like a lot of his symptoms were coming from medication side effects. He has also noted that he started having anxiety attacks since January which is new for him. Pt and his wife joined  [employer] and he recently ordered an upright walker with forearm supports to help his mobility. Pt reports losing 41# in the last 2 years secondary to drop in his calorie intake.  ? ? ?Pain:  ?Pain Intensity: Present: 0/10, Best: 0/10, Worst: 0/10 (not currently hurting) ?Pain location: Bilateral shoulders ?Pain Quality: sharp and aching  ?Radiating: No  ?  Numbness/Tingling: Yes, bilateral hand numbness occasionally as well as bilateral foot numbness.  ?Focal Weakness: No, however pt has noticed weakness in both shoulders and hands. ?Aggravating factors: Lifting arms; ?Relieving factors: injections, pain medication, no benefit with heat/topical creams, unable to take NSAIDs secondary to taking Xarelto ?24-hour pain behavior: Constant pain all the time (not currently); ?History of prior shoulder or neck injury, pain, surgery, or therapy: No ?Falls: Has patient fallen in last 6 months? No,  ?Dominant hand: right ?Imaging: Yes, R shoulder radiographs (03/29/21): Mild glenohumeral and mild acromioclavicular osteoarthritis; L shoulder radiographs (03/29/21):  Mild-to-moderate glenohumeral and mild acromioclavicular osteoarthritis. 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 Otherwise negative ? ?Precautions: None ? ?Weight Bearing Restrictions: No ? ?Living Environment ?Lives with: lives with their spouse ?Lives in: House/apartment ? ?Patient Goals: Improve shoulder strength and prevent pain from returning. Pt would also like to address his back pain/sciatica; ? ? ?OBJECTIVE:  ? ?Patient Surveys  ?FOTO: 50, predicted improvement  to 62 ? ?Cognition ?Patient is oriented to person, place, and time.  ?Recent memory is intact.  ?Remote memory is intact.  ?Attention span and concentration are intact.  ?Expressive speech is intact.  ?Pa

## 2021-05-10 ENCOUNTER — Telehealth (INDEPENDENT_AMBULATORY_CARE_PROVIDER_SITE_OTHER): Payer: Medicare Other | Admitting: Family Medicine

## 2021-05-10 ENCOUNTER — Other Ambulatory Visit: Payer: Self-pay | Admitting: Family Medicine

## 2021-05-10 ENCOUNTER — Encounter: Payer: Self-pay | Admitting: Family Medicine

## 2021-05-10 ENCOUNTER — Other Ambulatory Visit: Payer: Self-pay | Admitting: Cardiovascular Disease

## 2021-05-10 VITALS — Ht 72.0 in | Wt 331.0 lb

## 2021-05-10 DIAGNOSIS — M5136 Other intervertebral disc degeneration, lumbar region: Secondary | ICD-10-CM | POA: Diagnosis not present

## 2021-05-10 DIAGNOSIS — F419 Anxiety disorder, unspecified: Secondary | ICD-10-CM

## 2021-05-10 DIAGNOSIS — C61 Malignant neoplasm of prostate: Secondary | ICD-10-CM

## 2021-05-10 DIAGNOSIS — R937 Abnormal findings on diagnostic imaging of other parts of musculoskeletal system: Secondary | ICD-10-CM

## 2021-05-10 MED ORDER — DULOXETINE HCL 30 MG PO CPEP: 30.0000 mg | ORAL_CAPSULE | Freq: Every day | ORAL | 1 refills | Status: DC

## 2021-05-10 MED ORDER — BUSPIRONE HCL 10 MG PO TABS: 10.0000 mg | ORAL_TABLET | Freq: Four times a day (QID) | ORAL | 1 refills | Status: DC | PRN

## 2021-05-10 NOTE — Patient Instructions (Signed)
Thank you for coming to the office today.    Please schedule a Follow-up Appointment to: No follow-ups on file.  If you have any other questions or concerns, please feel free to call the office or send a message through MyChart. You may also schedule an earlier appointment if necessary.  Additionally, you may be receiving a survey about your experience at our office within a few days to 1 week by e-mail or mail. We value your feedback.  Savior Himebaugh, DO [facility], [facility] 

## 2021-05-10 NOTE — Therapy (Signed)
?OUTPATIENT PHYSICAL THERAPY SHOULDER/BACK TREATMENT ? ? ?Patient Name: [name] ?MRN: [id number] ?DOB:12-22-1955, 66 y.o., male ?Today's Date: 05/12/2021 ? ? PT End of Session - 05/12/21 0847   ? ? Visit Number 5   ? Number of Visits 25   ? Date for PT Re-Evaluation 07/22/21   ? Authorization Type eval: 04/29/21   ? PT Start Time 1400   ? PT Stop Time 9470   ? PT Time Calculation (min) 45 min   ? Activity Tolerance Patient tolerated treatment well   ? Behavior During Therapy [facility] for tasks assessed/performed   ? ?  ?  ? ?  ? ? ? ? ? ? ?Past Medical History:  ?Diagnosis Date  ? Cardiomyopathy (in setting of Afib)   ? a. 12/2013 Echo: EF 45-50%, mild ant and antsept HK. mild MR. Mod dil LA. nl RV fxn. Rhythm was Afib.  ? DJD (degenerative joint disease) of knee   ? History of kidney stones   ? Intervertebral disc disorder with radiculopathy of lumbosacral region   ? Kidney stone 2012  ? Lower extremity edema   ? PAF (paroxysmal atrial fibrillation) ([place])   ? Pernicious anemia   ? Prostate cancer ([place])   ? Pulmonary nodule, right   ? a. 10/2014 Cardiac CTA: 427m RLL nodule; b. 04/2015 CT Chest: stable 744mRLL nodule. No new nodules; 02/2017 CTA Chest: stable, benign, 27m25mLL pulm nodule.  ? SVT (supraventricular tachycardia) ([place] ? ?Past Surgical History:  ?Procedure Laterality Date  ? CARDIOVERSION N/A 05/15/2019  ? Procedure: CARDIOVERSION;  Surgeon: GolMinna MerrittsD;  Location: [facility] ORS;  Service: Cardiovascular;  Laterality: N/A;  ? CARPAL TUNNEL RELEASE Right 12/23/2014  ? Procedure: CARPAL TUNNEL RELEASE;  Surgeon: HowEarnestine LeysD;  Location: [facility] ORS;  Service: Orthopedics;  Laterality: Right;  ? CARPAL TUNNEL RELEASE Left 01/06/2015  ? Procedure: CARPAL TUNNEL RELEASE;  Surgeon: HowEarnestine LeysD;  Location: [facility] ORS;  Service: Orthopedics;  Laterality: Left;  ? COLONOSCOPY WITH PROPOFOL N/A 10/08/2017  ? Procedure: COLONOSCOPY WITH PROPOFOL;  Surgeon: VanLin LandsmanD;  [facility];   Service: Gastroenterology;  Laterality: N/A;  ? GASTRIC BYPASS  2002  ? KNEE SURGERY    ? knee trauma  ? prostate seeding    ? ?Patient Active Problem List  ? Diagnosis Date Noted  ? Localized osteoarthritis of shoulder regions, bilateral 03/29/2021  ? Chronic pain of both shoulders 03/29/2021  ? Drug-induced myopathy 02/21/2021  ? Trigger finger of right hand 11/15/2020  ? Prostate cancer ([place]7/11/2020  ? Insomnia 08/01/2019  ? Primary osteoarthritis of both wrists 02/17/2019  ? Trigger middle finger of left hand 02/17/2019  ? Chronic pain syndrome 02/17/2019  ? Chronic radicular lumbar pain 10/08/2018  ? Lumbar radiculopathy 10/08/2018  ? Lumbar degenerative disc disease 10/08/2018  ? Lumbar facet arthropathy 10/08/2018  ? Lumbar facet joint syndrome 10/08/2018  ? Intervertebral disc disorder with radiculopathy of lumbosacral region   ? Pernicious anemia 05/22/2016  ? Paroxysmal atrial fibrillation ([place]CC[place]/04/2015  ? DJD (degenerative joint disease) of knee 10/27/2014  ? Bilateral carpal tunnel syndrome 10/27/2014  ? Morbid obesity with BMI of 50.0-59.9, adult ([place]CC[place]abro[place]0/11/2014  ? Mixed hyperlipidemia 10/27/2014  ? H/O gastric bypass 03/20/2014  ? Hyperkalemia 03/20/2014  ? History of prostate cancer 12/26/2013  ? Essential hypertension 12/26/2013  ? Encounter for anticoagulation discussion and counseling 12/26/2013  ? ? ?PCP: KarOlin HauserO ? ?REFERRING PROVIDER: LatGillis Santa  MD ? ?REFERRING DIAGNOSIS: M19.011,M19.012 (ICD-10-CM) - Localized osteoarthritis of shoulder regions, bilateral, M65.30 (ICD-10-CM) - Trigger finger of right hand, unspecified finger, M25.511,G89.29,M25.512 (ICD-10-CM) - Chronic pain of both shoulders, M53.3 (ICD-10-CM) - Sacroiliac joint pain, G89.4 (ICD-10-CM) - Chronic pain syndrome ? ?THERAPY DIAG: Chronic left shoulder pain ? ?Chronic right shoulder pain ? ?Other low back pain ? ?Muscle weakness (generalized) ? ?ONSET DATE: 04/30/19 ? ?FOLLOW UP APPT WITH PROVIDER:  Yes  ? ? ?From initial evaluation (04/29/21) ? ?SUBJECTIVE:                                                                                                                                                                                        ? ?Chief Complaint: Bilateral shoulder pain ? ?Pertinent History ?[name] is a 66 y.o. year old, male patient referred to physical therapy by pain management for bilateral shoulder pain. Pt reports chronic bilateral shoulder pain for approximately the last 2 years. The pain started in his R wrist which improved after an injection however the pain gradually started worsening and then moving up his R arm to his R shoulder. Eventually he started having pain in his L shoulder as well. He noticed the most significant pain when lifting his arms to wash under them while bathing. He had injections in both shoulders on March 27th with significant improvement in pain since that time. At this point pt states that he is not having any shoulder pain. He has a past medical history of Cardiomyopathy (in setting of Afib), DJD (degenerative joint disease) of knee, History of kidney stones, Intervertebral disc disorder with radiculopathy of lumbosacral region, Kidney stone (2012), Lower extremity edema, PAF (paroxysmal atrial fibrillation) ([place]), Pernicious anemia, Prostate cancer ([place]), Pulmonary nodule, right, and SVT (supraventricular tachycardia) ([place]). He also  has a past surgical history that includes Knee surgery; Gastric bypass (2002); prostate seeding; Carpal tunnel release (Right, 12/23/2014); Carpal tunnel release (Left, 01/06/2015); Colonoscopy with propofol (N/A, 10/08/2017); and Cardioversion (N/A, 05/15/2019). He has tapered off a lot of his medications over the last few months because he felt like a lot of his symptoms were coming from medication side effects. He has also noted that he started having anxiety attacks since January which is new for him. Pt and his wife joined  [employer] and he recently ordered an upright walker with forearm supports to help his mobility. Pt reports losing 41# in the last 2 years secondary to drop in his calorie intake.  ? ? ?Pain:  ?Pain Intensity: Present: 0/10, Best: 0/10, Worst: 0/10 (not currently hurting) ?Pain location: Bilateral shoulders ?Pain Quality: sharp and aching  ?Radiating:  No  ?Numbness/Tingling: Yes, bilateral hand numbness occasionally as well as bilateral foot numbness.  ?Focal Weakness: No, however pt has noticed weakness in both shoulders and hands. ?Aggravating factors: Lifting arms; ?Relieving factors: injections, pain medication, no benefit with heat/topical creams, unable to take NSAIDs secondary to taking Xarelto ?24-hour pain behavior: Constant pain all the time (not currently); ?History of prior shoulder or neck injury, pain, surgery, or therapy: No ?Falls: Has patient fallen in last 6 months? No,  ?Dominant hand: right ?Imaging: Yes, R shoulder radiographs (03/29/21): Mild glenohumeral and mild acromioclavicular osteoarthritis; L shoulder radiographs (03/29/21):  Mild-to-moderate glenohumeral and mild acromioclavicular osteoarthritis. 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 Otherwise negative ? ?Precautions: None ? ?Weight Bearing Restrictions: No ? ?Living Environment ?Lives with: lives with their spouse ?Lives in: House/apartment ? ?Patient Goals: Improve shoulder strength and prevent pain from returning. Pt would also like to address his back pain/sciatica; ? ? ?OBJECTIVE:  ? ?Patient Surveys  ?FOTO: 50, predicted improvement  to 62 ? ?Cognition ?Patient is oriented to person, place, and time.  ?Recent memory is intact.  ?Remote memory is intact.  ?Attention span and concentration are intact.  ?Expressive speech is intact.  ?

## 2021-05-10 NOTE — Progress Notes (Signed)
Virtual Visit via Telephone ?The purpose of this virtual visit is to provide medical care while limiting exposure to the novel coronavirus (COVID19) for both patient and office staff. ? ?Consent was obtained for phone visit:  Yes.   ?Answered questions that patient had about telehealth interaction:  Yes.   ?I discussed the limitations, risks, security and privacy concerns of performing an evaluation and management service by telephone. I also discussed with the patient that there may be a patient responsible charge related to this service. The patient expressed understanding and agreed to proceed. ? ?Patient Location: Home ?Provider Location: [facility] (Office) ? ?Participants in virtual visit: ?- Patient: [name] ?- CMA: Orinda Kenner, CMA ?- Provider: Dr Parks Ranger ? ?---------------------------------------------------------------------- ?Chief Complaint  ?Patient presents with  ? Follow up on MRI  ? Back Pain  ? Anxiety  ? ? ?S: Reviewed CMA documentation. I have called patient and gathered additional HPI as follows: ? ?Chronic Back Pain ?[facility] Dr Holley Raring ? ?Followed with PT. Prior MRI done at [facility] per Dr Holley Raring orders. MRI was done 09/12/18 see report below. ? ?RAF Ablation in back significant improvement. He was off medication and did well it controlled his pain. ? ?Then back pain had returned in mid section of back. Described moderate to severe pain with stiffness in back muscles. Would get some tense back muscles and pain worse with activity and worse in evening. ? ?PT reviewed MRI with concern of sclerotic lesion of bone L5 ? ?His apt with Dr Holley Raring for MRI review in 2 days ? ?History of Prostate Cancer ?Previously seen by Dr Maryan Puls [facility] - treated approx 2017 by seed implant, has had negative PSA since that time and done well, no longer followed by Urology. ?Last PSA 0.04, negative (07/21/20) ? ?Anxiety, generalized ?Last visit, discussed this he has  done well on Buspar '5mg'$  taking 1-2 tabs 3-4 times a day only lasting 3 hours approx. Needs new order and asking for other things to control anxiety, difficulty sleeping ? ?Asking about referral for specialist at Cancer center for prostate cancer if this is related. ? ?Denies any fevers, chills, sweats, body ache, cough, shortness of breath, sinus pain or pressure, headache, abdominal pain, diarrhea ? ?Past Medical History:  ?Diagnosis Date  ? Cardiomyopathy (in setting of Afib)   ? a. 12/2013 Echo: EF 45-50%, mild ant and antsept HK. mild MR. Mod dil LA. nl RV fxn. Rhythm was Afib.  ? DJD (degenerative joint disease) of knee   ? History of kidney stones   ? Intervertebral disc disorder with radiculopathy of lumbosacral region   ? Kidney stone 2012  ? Lower extremity edema   ? PAF (paroxysmal atrial fibrillation) ([place])   ? Pernicious anemia   ? Prostate cancer ([place])   ? Pulmonary nodule, right   ? a. 10/2014 Cardiac CTA: 228m RLL nodule; b. 04/2015 CT Chest: stable 762mRLL nodule. No new nodules; 02/2017 CTA Chest: stable, benign, 28m29mLL pulm nodule.  ? SVT (supraventricular tachycardia) ([place] ? ?Social History  ? ?Tobacco Use  ? Smoking status: Former  ?  Packs/day: 1.00  ?  Years: 10.00  ?  Pack years: 10.00  ?  Types: Cigarettes  ?  Quit date: 02/18/1979  ?  Years since quitting: 42.2  ? Smokeless tobacco: Former  ?  Types: Snuff  ?Vaping Use  ? Vaping Use: Never used  ?Substance Use Topics  ? Alcohol use: Yes  ?  Alcohol/week: 12.0 standard drinks  ?  Types: 12 Cans of beer per week  ?  Comment: weekly  ? Drug use: No  ? ? ?Current Outpatient Medications:  ?  acetaminophen (TYLENOL) 500 MG tablet, Take 1,000 mg by mouth every 6 (six) hours as needed for moderate pain or headache., Disp: , Rfl:  ?  amiodarone (PACERONE) 200 MG tablet, TAKE ONE TABLET BY MOUTH ONE TIME DAILY, Disp: 90 tablet, Rfl: 2 ?  amLODipine (NORVASC) 5 MG tablet, Take 1 tablet (5 mg total) by mouth daily., Disp: 90 tablet, Rfl: 3 ?  Aspirin 325  MG CAPS, Take 325 mg by mouth daily., Disp: , Rfl:  ?  benazepril (LOTENSIN) 40 MG tablet, TAKE 1 TABLET(40 MG) BY MOUTH DAILY, Disp: 90 tablet, Rfl: 3 ?  busPIRone (BUSPAR) 10 MG tablet, Take 1 tablet (10 mg total) by mouth 4 (four) times daily as needed (anxiety)., Disp: 360 tablet, Rfl: 1 ?  cyanocobalamin (,VITAMIN B-12,) 1000 MCG/ML injection, INJECT 1ML EVERY 30 DAYS AS DIRECTED, Disp: 1 mL, Rfl: PRN ?  DULoxetine (CYMBALTA) 30 MG capsule, Take 1 capsule (30 mg total) by mouth daily., Disp: 90 capsule, Rfl: 1 ?  fluticasone (FLONASE) 50 MCG/ACT nasal spray, USE TWO SPRAYS IN THE AFFECTED NOSTRIL ONE TIME DAILY, Disp: 16 mL, Rfl: 0 ?  furosemide (LASIX) 20 MG tablet, Take 1 tablet (20 mg total) by mouth daily as needed., Disp: 90 tablet, Rfl: 3 ?  HYDROcodone-acetaminophen (NORCO) 7.5-325 MG tablet, Take 1 tablet by mouth 3 (three) times daily as needed for severe pain. Must last 30 days, Disp: 90 tablet, Rfl: 0 ?  Ibuprofen 200 MG CAPS, Take 400 mg by mouth at bedtime as needed., Disp: , Rfl:  ?  melatonin 3 MG TABS tablet, Take 3 mg by mouth at bedtime., Disp: , Rfl:  ?  metoprolol succinate (TOPROL-XL) 50 MG 24 hr tablet, Take 0.5 tablets (25 mg total) by mouth daily. TAKE 1 TABLET(50 MG) BY MOUTH DAILY WITH OR IMMEDIATELY FOLLOWING A MEAL, Disp: 45 tablet, Rfl: 3 ?  Multiple Vitamin (MULTIVITAMIN WITH MINERALS) TABS tablet, Take 1 tablet by mouth daily., Disp: , Rfl:  ?  predniSONE (DELTASONE) 20 MG tablet, Take 3 tablets (60 mg total) by mouth daily with breakfast for 3 days, THEN 2 tablets (40 mg total) daily with breakfast for 3 days, THEN 1 tablet (20 mg total) daily with breakfast for 3 days., Disp: 18 tablet, Rfl: 0 ?  rivaroxaban (XARELTO) 20 MG TABS tablet, Take 1 tablet (20 mg total) by mouth daily with supper., Disp: 90 tablet, Rfl: 1 ?  traZODone (DESYREL) 150 MG tablet, Take 1 tablet (150 mg total) by mouth at bedtime., Disp: 90 tablet, Rfl: 3 ? ? ?  05/10/2021  ?  4:05 PM 04/11/2021  ?  9:34 AM  03/01/2021  ? 11:01 AM  ?Depression screen PHQ 2/9  ?Decreased Interest 0 0 0  ?Down, Depressed, Hopeless 0 0 0  ?PHQ - 2 Score 0 0 0  ?Altered sleeping 1  1  ?Tired, decreased energy 0  0  ?Change in appetite 0  0  ?Feeling bad or failure about yourself  0  0  ?Trouble concentrating 0  0  ?Moving slowly or fidgety/restless 0  0  ?Suicidal thoughts 0  0  ?PHQ-9 Score 1  1  ?Difficult doing work/chores Not difficult at all  Not difficult at all  ? ? ? ?  05/10/2021  ?  4:05 PM 03/01/2021  ?  11:01 AM 02/21/2021  ?  9:33 AM 08/01/2019  ?  4:54 PM  ?GAD 7 : Generalized Anxiety Score  ?Nervous, Anxious, on Edge 0 0 0 0  ?Control/stop worrying 0 0 0 0  ?Worry too much - different things 0 0 0 0  ?Trouble relaxing 0 0 0 0  ?Restless 0 0 0 0  ?Easily annoyed or irritable 0 0 0 0  ?Afraid - awful might happen 0 0 0 0  ?Total GAD 7 Score 0 0 0 0  ?Anxiety Difficulty Not difficult at all Not difficult at all Not difficult at all Not difficult at all  ? ? ?-------------------------------------------------------------------------- ?O: No physical exam performed due to remote telephone encounter. ? ?Lab results reviewed. ? ?Recent Results (from the past 2160 hour(s))  ?Thyroid Panel     Status: None  ? Collection Time: 03/04/21  3:45 PM  ?Result Value Ref Range  ? T4, Total 9.3 4.5 - 12.0 ug/dL  ? T3 Uptake Ratio 30 24 - 39 %  ? Free Thyroxine Index 2.8 1.2 - 4.9  ?  Comment: (NOTE) ?Performed At: [facility] ?[address] [place], [place] [id number] ?Rush Farmer MD [id number] ?  ?T4, free     Status: Abnormal  ? Collection Time: 03/04/21  3:45 PM  ?Result Value Ref Range  ? Free T4 1.19 (H) 0.61 - 1.12 ng/dL  ?  Comment: (NOTE) ?Biotin ingestion may interfere with free T4 tests. If the results are ?inconsistent with the TSH level, previous test results, or the ?clinical presentation, then consider biotin interference. If needed, ?order repeat testing after stopping biotin. ?Performed at [facility], [place], ?[place] [address] ?  ?TSH     Status: None  ? Collection Time: 03/04/21  3:45 PM  ?Result Value Ref Range  ? TSH 1.462 0.350 - 4.500 uIU/mL  ?  Comment: Performed by a 3rd Generation

## 2021-05-11 ENCOUNTER — Telehealth: Payer: Medicare Other | Admitting: Student in an Organized Health Care Education/Training Program

## 2021-05-11 ENCOUNTER — Encounter: Payer: Self-pay | Admitting: Student in an Organized Health Care Education/Training Program

## 2021-05-11 ENCOUNTER — Ambulatory Visit: Payer: Medicare Other

## 2021-05-11 DIAGNOSIS — M25511 Pain in right shoulder: Secondary | ICD-10-CM | POA: Diagnosis not present

## 2021-05-11 DIAGNOSIS — G894 Chronic pain syndrome: Secondary | ICD-10-CM | POA: Diagnosis not present

## 2021-05-11 DIAGNOSIS — R293 Abnormal posture: Secondary | ICD-10-CM | POA: Diagnosis not present

## 2021-05-11 DIAGNOSIS — M6281 Muscle weakness (generalized): Secondary | ICD-10-CM | POA: Diagnosis not present

## 2021-05-11 DIAGNOSIS — M6530 Trigger finger, unspecified finger: Secondary | ICD-10-CM | POA: Diagnosis not present

## 2021-05-11 DIAGNOSIS — M5459 Other low back pain: Secondary | ICD-10-CM

## 2021-05-11 DIAGNOSIS — G8929 Other chronic pain: Secondary | ICD-10-CM | POA: Diagnosis not present

## 2021-05-11 DIAGNOSIS — M25512 Pain in left shoulder: Secondary | ICD-10-CM | POA: Diagnosis not present

## 2021-05-11 DIAGNOSIS — M19011 Primary osteoarthritis, right shoulder: Secondary | ICD-10-CM | POA: Diagnosis not present

## 2021-05-11 DIAGNOSIS — M19012 Primary osteoarthritis, left shoulder: Secondary | ICD-10-CM | POA: Diagnosis not present

## 2021-05-11 DIAGNOSIS — M533 Sacrococcygeal disorders, not elsewhere classified: Secondary | ICD-10-CM | POA: Diagnosis not present

## 2021-05-12 ENCOUNTER — Ambulatory Visit
Payer: Medicare Other | Attending: Student in an Organized Health Care Education/Training Program | Admitting: Student in an Organized Health Care Education/Training Program

## 2021-05-12 ENCOUNTER — Encounter: Payer: Self-pay | Admitting: Student in an Organized Health Care Education/Training Program

## 2021-05-12 DIAGNOSIS — M47816 Spondylosis without myelopathy or radiculopathy, lumbar region: Secondary | ICD-10-CM

## 2021-05-12 DIAGNOSIS — M899 Disorder of bone, unspecified: Secondary | ICD-10-CM | POA: Diagnosis not present

## 2021-05-12 MED ORDER — DIAZEPAM 5 MG PO TABS: 5.0000 mg | ORAL_TABLET | ORAL | 0 refills | Status: DC

## 2021-05-12 NOTE — Progress Notes (Addendum)
Patient: [name]  Service Category: E/M  Provider: Gillis Santa, MD  ?DOB: 06-May-1955  DOS: 05/12/2021  Location: Office  ?MRN: [id number]  Setting: Ambulatory outpatient  Referring Provider: Nobie Putnam *  ?Type: Established Patient  Specialty: Interventional Pain Management  PCP: Olin Hauser, DO  ?Location: Remote location  Delivery: TeleHealth    ? ?Virtual Encounter - Pain Management ?PROVIDER NOTE: Information contained herein reflects review and annotations entered in association with encounter. Interpretation of such information and data should be left to medically-trained personnel. Information provided to patient can be located elsewhere in the medical record under "Patient Instructions". Document created using STT-dictation technology, any transcriptional errors that may result from process are unintentional.  ?  ?Contact & Pharmacy ?Preferred: [phone] ?Home: [phone] (home) ?Mobile: [phone] (mobile) ?E-mail: [email or web address]  ?[facility] [address] [place], [place] [address] ?[address] [place] [address] ?Phone: [phone] Fax: [phone] ?  ?Pre-screening  ?[name] offered "in-person" vs "virtual" encounter. He indicated preferring virtual for this encounter.  ? ?Reason ?COVID-19*  Social distancing based on CDC and AMA recommendations.  ? ?I contacted [name] on 05/12/2021 via telephone.      I clearly identified myself as Gillis Santa, MD. I verified that I was speaking with the correct person using two identifiers (Name: [name], and date of birth: [birth year]). ? ?Consent ?I sought verbal advanced consent from [name] for virtual visit interactions. I informed [name] of possible security and privacy concerns, risks, and limitations associated with providing "not-in-person" medical evaluation and management services. I also informed [name] of the availability of "in-person"  appointments. Finally, I informed him that there would be a charge for the virtual visit and that he could be  personally, fully or partially, financially responsible for it. [name] expressed understanding and agreed to proceed.  ? ?Historic Elements   ?[name] is a 66 y.o. year old, male patient evaluated today after our last contact on 05/05/2021. [name]  has a past medical history of Cardiomyopathy (in setting of Afib), DJD (degenerative joint disease) of knee, History of kidney stones, Intervertebral disc disorder with radiculopathy of lumbosacral region, Kidney stone (2012), Lower extremity edema, PAF (paroxysmal atrial fibrillation) ([place]), Pernicious anemia, Prostate cancer ([place]), Pulmonary nodule, right, and SVT (supraventricular tachycardia) ([place]). He also  has a past surgical history that includes Knee surgery; Gastric bypass (2002); prostate seeding; Carpal tunnel release (Right, 12/23/2014); Carpal tunnel release (Left, 01/06/2015); Colonoscopy with propofol (N/A, 10/08/2017); and Cardioversion (N/A, 05/15/2019). [name] has a current medication list which includes the following prescription(s): acetaminophen, amiodarone, amlodipine, aspirin, benazepril, buspirone, cyanocobalamin, diazepam, duloxetine, fluticasone, furosemide, hydrocodone-acetaminophen, ibuprofen, melatonin, metoprolol succinate, multivitamin with minerals, rivaroxaban, and trazodone. He  reports that he quit smoking about 42 years ago. His smoking use included cigarettes. He has a 10.00 pack-year smoking history. He has quit using smokeless tobacco.  His smokeless tobacco use included snuff. He reports current alcohol use of about 12.0 standard drinks per week. He reports that he does not use drugs. [name] has No Known Allergies.  ? ?HPI  ?Today, he is being contacted for worsening of previously known (established) problem ? ?[name] has been dealing with increased low back pain.  He had an MRI done at [facility] which  showed an L2 sclerotic bone lesion.  He does have a history of prostate cancer and with that history, his primary care  provider Dr. Parks Ranger and I have been discussing and are in agreement that a repeat lumbar MRI with and without contrast for confirmation will be helpful.  I will send in Valium for the patient to take prior to his MRI to help with anxiety.  His primary care provider has started him on Cymbalta for anxiety as well.  After we have results from his lumbar MRI with and without contrast, we will discuss further treatment plan and consider referral again to oncology if there is concern for cancer recurrence.  Patient in agreement with plan.  I instructed him to continue with physical therapy in the interim. ? ?Pharmacotherapy Assessment  ? ?Opioid Analgesic: Hydrocodone 7.5 mg TID PRN     ? ?Monitoring: ?[place] PMP: PDMP reviewed during this encounter.       ?Pharmacotherapy: No side-effects or adverse reactions reported. ?Compliance: No problems identified. ?Effectiveness: Clinically acceptable. ?Plan: Refer to "POC". UDS:  ?Summary  ?Date Value Ref Range Status  ?04/06/2020 Note  Final  ?  Comment:  ?  ==================================================================== ?ToxASSURE Select 13 (MW) ?==================================================================== ?Test                             Result       Flag       Units ? ?Drug Present and Declared for Prescription Verification ?  Tramadol                       >4386        EXPECTED   ng/mg creat ?  O-Desmethyltramadol            3353         EXPECTED   ng/mg creat ?  N-Desmethyltramadol            >4386        EXPECTED   ng/mg creat ?   Source of tramadol is a prescription medication. O-desmethyltramadol ?   and N-desmethyltramadol are expected metabolites of tramadol. ? ?==================================================================== ?Test                      Result    Flag   Units      Ref Range ?  Creatinine              114               mg/dL      >=20 ?==================================================================== ?Declared Medications: ? The flagging and interpretation on this report are based on the ? following declared medications.  Unexpected results may arise from ? inaccuracies in the declared medications. ? ? **Note: The testing scope of this panel includes these medications: ? ? Tramadol ? ? **Note: The testing scope of this panel does not include the ? following reported medications: ? ? Acetaminophen (Tylenol) ? Amiodarone ? Amlodipine ? Benazepril ? Cyclobenzaprine (Flexeril) ? Ezetimibe ? Fluticasone ? Furosemide ? Gabapentin ? Menthol ? Methyl Salicylate (topical) ? Metoprolol ? Mirtazapine ? Multivitamin ? Rivaroxaban ? Rosuvastatin ? Trazodone ? Vitamin B12 ?==================================================================== ?For clinical consultation, please call [phone]. ?==================================================================== ?  ?  ? ?Laboratory Chemistry Profile  ? ?Renal ?Lab Results  ?Component Value Date  ? BUN 17 07/21/2020  ? CREATININE 1.21 07/21/2020  ? BCR NOT APPLICABLE 82/95/6213  ? GFRAA 72 07/21/2020  ? GFRNONAA 62 07/21/2020  ?  Hepatic ?Lab Results  ?Component Value Date  ? AST 15 07/21/2020  ?  ALT 11 07/21/2020  ? ALBUMIN 4.6 08/01/2019  ? ALKPHOS 72 08/01/2019  ? LIPASE 28 03/13/2017  ?  ?Electrolytes ?Lab Results  ?Component Value Date  ? NA 143 07/21/2020  ? K 4.2 07/21/2020  ? CL 108 07/21/2020  ? CALCIUM 9.0 07/21/2020  ? MG 2.0 02/01/2021  ?  Bone ?Lab Results  ?Component Value Date  ? 25OHVITD1 32 02/01/2021  ? 12YQMGNO0 <1.0 02/01/2021  ? 25OHVITD3 32 02/01/2021  ?  ?Inflammation (CRP: Acute Phase) (ESR: Chronic Phase) ?No results found for: CRP, ESRSEDRATE, LATICACIDVEN    ?  ? ?Note: Above Lab results reviewed. ? ?Imaging  ?DG Si Joints ?CLINICAL DATA:  Bilateral sacroiliac joint pain ? ?EXAM: ?BILATERAL SACROILIAC JOINTS - 3+ VIEW ? ?COMPARISON:  None. ? ?FINDINGS: ?Frontal  and bilateral oblique views of the sacroiliac joints are ?obtained. Joint spaces are symmetrical. No erosive changes. There ?are no acute bony abnormalities. Prominent spondylosis at L4-5 and ?L5-S1. Brachytherapy see

## 2021-05-16 ENCOUNTER — Ambulatory Visit: Payer: Medicare Other | Attending: Student in an Organized Health Care Education/Training Program

## 2021-05-16 ENCOUNTER — Telehealth: Payer: Self-pay

## 2021-05-16 DIAGNOSIS — M25511 Pain in right shoulder: Secondary | ICD-10-CM | POA: Diagnosis not present

## 2021-05-16 DIAGNOSIS — M6281 Muscle weakness (generalized): Secondary | ICD-10-CM | POA: Diagnosis not present

## 2021-05-16 DIAGNOSIS — M5459 Other low back pain: Secondary | ICD-10-CM | POA: Diagnosis not present

## 2021-05-16 DIAGNOSIS — G8929 Other chronic pain: Secondary | ICD-10-CM | POA: Diagnosis not present

## 2021-05-16 DIAGNOSIS — M25512 Pain in left shoulder: Secondary | ICD-10-CM | POA: Diagnosis not present

## 2021-05-16 NOTE — Telephone Encounter (Signed)
Patient states lesion is on L2 according to [facility] report, but he noticed you have L5 in the notes. Does this need to be changed  ?

## 2021-05-16 NOTE — Therapy (Signed)
?OUTPATIENT PHYSICAL THERAPY SHOULDER/BACK TREATMENT ? ? ?Patient Name: [name] ?MRN: [id number] ?DOB:Apr 30, 1955, 66 y.o., male ?Today's Date: 05/17/2021 ? ? PT End of Session - 05/16/21 1400   ? ? Visit Number 6   ? Number of Visits 25   ? Date for PT Re-Evaluation 07/22/21   ? Authorization Type eval: 04/29/21   ? PT Start Time 1400   ? PT Stop Time 5102   ? PT Time Calculation (min) 45 min   ? Activity Tolerance Patient tolerated treatment well   ? Behavior During Therapy [facility] for tasks assessed/performed   ? ?  ?  ? ?  ? ? ? ? ? ? ? ?Past Medical History:  ?Diagnosis Date  ? Cardiomyopathy (in setting of Afib)   ? a. 12/2013 Echo: EF 45-50%, mild ant and antsept HK. mild MR. Mod dil LA. nl RV fxn. Rhythm was Afib.  ? DJD (degenerative joint disease) of knee   ? History of kidney stones   ? Intervertebral disc disorder with radiculopathy of lumbosacral region   ? Kidney stone 2012  ? Lower extremity edema   ? PAF (paroxysmal atrial fibrillation) ([place])   ? Pernicious anemia   ? Prostate cancer ([place])   ? Pulmonary nodule, right   ? a. 10/2014 Cardiac CTA: 81m RLL nodule; b. 04/2015 CT Chest: stable 728mRLL nodule. No new nodules; 02/2017 CTA Chest: stable, benign, 75m24mLL pulm nodule.  ? SVT (supraventricular tachycardia) ([place] [facility] ? ?Past Surgical History:  ?Procedure Laterality Date  ? CARDIOVERSION N/A 05/15/2019  ? Procedure: CARDIOVERSION;  Surgeon: GolMinna MerrittsD;  Location: [facility] ORS;  Service: Cardiovascular;  Laterality: N/A;  ? CARPAL TUNNEL RELEASE Right 12/23/2014  ? Procedure: CARPAL TUNNEL RELEASE;  Surgeon: HowEarnestine LeysD;  Location: [facility] ORS;  Service: Orthopedics;  Laterality: Right;  ? CARPAL TUNNEL RELEASE Left 01/06/2015  ? Procedure: CARPAL TUNNEL RELEASE;  Surgeon: HowEarnestine LeysD;  Location: [facility] ORS;  Service: Orthopedics;  Laterality: Left;  ? COLONOSCOPY WITH PROPOFOL N/A 10/08/2017  ? Procedure: COLONOSCOPY WITH PROPOFOL;  Surgeon: VanLin LandsmanD;  [facility];   Service: Gastroenterology;  Laterality: N/A;  ? GASTRIC BYPASS  2002  ? KNEE SURGERY    ? knee trauma  ? prostate seeding    ? ?Patient Active Problem List  ? Diagnosis Date Noted  ? Lesion of bone of lumbosacral spine (L5) 05/12/2021  ? Localized osteoarthritis of shoulder regions, bilateral 03/29/2021  ? Chronic pain of both shoulders 03/29/2021  ? Drug-induced myopathy 02/21/2021  ? Trigger finger of right hand 11/15/2020  ? Prostate cancer ([facility]/11/2020  ? Insomnia 08/01/2019  ? Primary osteoarthritis of both wrists 02/17/2019  ? Trigger middle finger of left hand 02/17/2019  ? Chronic pain syndrome 02/17/2019  ? Chronic radicular lumbar pain 10/08/2018  ? Lumbar radiculopathy 10/08/2018  ? Lumbar degenerative disc disease 10/08/2018  ? Lumbar facet arthropathy 10/08/2018  ? Lumbar facet joint syndrome 10/08/2018  ? Intervertebral disc disorder with radiculopathy of lumbosacral region   ? Pernicious anemia 05/22/2016  ? Paroxysmal atrial fibrillation ([place]2/04/2015  ? DJD (degenerative joint disease) of knee 10/27/2014  ? Bilateral carpal tunnel syndrome 10/27/2014  ? Morbid obesity with BMI of 50.0-59.9, adult ([facility]0/11/2014  ? Mixed hyperlipidemia 10/27/2014  ? H/O gastric bypass 03/20/2014  ? Hyperkalemia 03/20/2014  ? History of prostate cancer 12/26/2013  ? Essential hypertension 12/26/2013  ? Encounter for anticoagulation discussion and counseling 12/26/2013  ? ? ?  PCP: Olin Hauser, DO ? ?REFERRING PROVIDER: Gillis Santa, MD ? ?REFERRING DIAGNOSIS: M19.011,M19.012 (ICD-10-CM) - Localized osteoarthritis of shoulder regions, bilateral, M65.30 (ICD-10-CM) - Trigger finger of right hand, unspecified finger, M25.511,G89.29,M25.512 (ICD-10-CM) - Chronic pain of both shoulders, M53.3 (ICD-10-CM) - Sacroiliac joint pain, G89.4 (ICD-10-CM) - Chronic pain syndrome ? ?THERAPY DIAG: Chronic left shoulder pain ? ?Chronic right shoulder pain ? ?Other low back pain ? ?Muscle weakness  (generalized) ? ?ONSET DATE: 04/30/19 ? ?FOLLOW UP APPT WITH PROVIDER: Yes  ? ? ?From initial evaluation (04/29/21) ? ?SUBJECTIVE:                                                                                                                                                                                        ? ?Chief Complaint: Bilateral shoulder pain ? ?Pertinent History ?[name] is a 66 y.o. year old, male patient referred to physical therapy by pain management for bilateral shoulder pain. Pt reports chronic bilateral shoulder pain for approximately the last 2 years. The pain started in his R wrist which improved after an injection however the pain gradually started worsening and then moving up his R arm to his R shoulder. Eventually he started having pain in his L shoulder as well. He noticed the most significant pain when lifting his arms to wash under them while bathing. He had injections in both shoulders on March 27th with significant improvement in pain since that time. At this point pt states that he is not having any shoulder pain. He has a past medical history of Cardiomyopathy (in setting of Afib), DJD (degenerative joint disease) of knee, History of kidney stones, Intervertebral disc disorder with radiculopathy of lumbosacral region, Kidney stone (2012), Lower extremity edema, PAF (paroxysmal atrial fibrillation) ([place]), Pernicious anemia, Prostate cancer ([place]), Pulmonary nodule, right, and SVT (supraventricular tachycardia) ([place]). He also  has a past surgical history that includes Knee surgery; Gastric bypass (2002); prostate seeding; Carpal tunnel release (Right, 12/23/2014); Carpal tunnel release (Left, 01/06/2015); Colonoscopy with propofol (N/A, 10/08/2017); and Cardioversion (N/A, 05/15/2019). He has tapered off a lot of his medications over the last few months because he felt like a lot of his symptoms were coming from medication side effects. He has also noted that he started having  anxiety attacks since January which is new for him. Pt and his wife joined [employer] and he recently ordered an upright walker with forearm supports to help his mobility. Pt reports losing 41# in the last 2 years secondary to drop in his calorie intake.  ? ? ?Pain:  ?Pain Intensity: Present: 0/10, Best: 0/10, Worst: 0/10 (not currently hurting) ?Pain  location: Bilateral shoulders ?Pain Quality: sharp and aching  ?Radiating: No  ?Numbness/Tingling: Yes, bilateral hand numbness occasionally as well as bilateral foot numbness.  ?Focal Weakness: No, however pt has noticed weakness in both shoulders and hands. ?Aggravating factors: Lifting arms; ?Relieving factors: injections, pain medication, no benefit with heat/topical creams, unable to take NSAIDs secondary to taking Xarelto ?24-hour pain behavior: Constant pain all the time (not currently); ?History of prior shoulder or neck injury, pain, surgery, or therapy: No ?Falls: Has patient fallen in last 6 months? No,  ?Dominant hand: right ?Imaging: Yes, R shoulder radiographs (03/29/21): Mild glenohumeral and mild acromioclavicular osteoarthritis; L shoulder radiographs (03/29/21):  Mild-to-moderate glenohumeral and mild acromioclavicular osteoarthritis. 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 Otherwise negative ? ?Precautions: None ? ?Weight Bearing Restrictions: No ? ?Living Environment ?Lives with: lives with their spouse ?Lives in: House/apartment ? ?Patient Goals: Improve shoulder strength and prevent pain from returning. Pt would also like to address his back  pain/sciatica; ? ? ?OBJECTIVE:  ? ?Patient Surveys  ?FOTO: 50, predicted improvement to 62 ? ?Cognition ?Patient is oriented to person, place, and time.  ?Recent memory is intact.  ?Remote memory is intact.  ?Attention span and con

## 2021-05-18 ENCOUNTER — Ambulatory Visit: Payer: Medicare Other

## 2021-05-18 DIAGNOSIS — M6281 Muscle weakness (generalized): Secondary | ICD-10-CM | POA: Diagnosis not present

## 2021-05-18 DIAGNOSIS — M5459 Other low back pain: Secondary | ICD-10-CM | POA: Diagnosis not present

## 2021-05-18 DIAGNOSIS — M25512 Pain in left shoulder: Secondary | ICD-10-CM | POA: Diagnosis not present

## 2021-05-18 DIAGNOSIS — G8929 Other chronic pain: Secondary | ICD-10-CM | POA: Diagnosis not present

## 2021-05-18 DIAGNOSIS — M25511 Pain in right shoulder: Secondary | ICD-10-CM | POA: Diagnosis not present

## 2021-05-18 NOTE — Therapy (Signed)
?OUTPATIENT PHYSICAL THERAPY SHOULDER/BACK TREATMENT ? ? ?Patient Name: [name] ?MRN: [id number] ?DOB:July 21, 1955, 66 y.o., male ?Today's Date: 05/19/2021 ? ? PT End of Session - 05/18/21 1025   ? ? Visit Number 7   ? Number of Visits 25   ? Date for PT Re-Evaluation 07/22/21   ? Authorization Type eval: 04/29/21   ? PT Start Time 1017   ? PT Stop Time 1100   ? PT Time Calculation (min) 43 min   ? Activity Tolerance Patient tolerated treatment well   ? Behavior During Therapy [facility] for tasks assessed/performed   ? ?  ?  ? ?  ? ? ? ? ? ? ? ?Past Medical History:  ?Diagnosis Date  ? Cardiomyopathy (in setting of Afib)   ? a. 12/2013 Echo: EF 45-50%, mild ant and antsept HK. mild MR. Mod dil LA. nl RV fxn. Rhythm was Afib.  ? DJD (degenerative joint disease) of knee   ? History of kidney stones   ? Intervertebral disc disorder with radiculopathy of lumbosacral region   ? Kidney stone 2012  ? Lower extremity edema   ? PAF (paroxysmal atrial fibrillation) ([place])   ? Pernicious anemia   ? Prostate cancer ([place])   ? Pulmonary nodule, right   ? a. 10/2014 Cardiac CTA: 36m RLL nodule; b. 04/2015 CT Chest: stable 726mRLL nodule. No new nodules; 02/2017 CTA Chest: stable, benign, 40m60mLL pulm nodule.  ? SVT (supraventricular tachycardia) ([place] ? ?Past Surgical History:  ?Procedure Laterality Date  ? CARDIOVERSION N/A 05/15/2019  ? Procedure: CARDIOVERSION;  Surgeon: GolMinna MerrittsD;  Location: [facility] ORS;  Service: Cardiovascular;  Laterality: N/A;  ? CARPAL TUNNEL RELEASE Right 12/23/2014  ? Procedure: CARPAL TUNNEL RELEASE;  Surgeon: HowEarnestine LeysD;  Location: [facility] ORS;  Service: Orthopedics;  Laterality: Right;  ? CARPAL TUNNEL RELEASE Left 01/06/2015  ? Procedure: CARPAL TUNNEL RELEASE;  Surgeon: HowEarnestine LeysD;  Location: [facility] ORS;  Service: Orthopedics;  Laterality: Left;  ? COLONOSCOPY WITH PROPOFOL N/A 10/08/2017  ? Procedure: COLONOSCOPY WITH PROPOFOL;  Surgeon: VanLin LandsmanD;  [facility];   Service: Gastroenterology;  Laterality: N/A;  ? GASTRIC BYPASS  2002  ? KNEE SURGERY    ? knee trauma  ? prostate seeding    ? ?Patient Active Problem List  ? Diagnosis Date Noted  ? Lesion of bone of lumbosacral spine (L5) 05/12/2021  ? Localized osteoarthritis of shoulder regions, bilateral 03/29/2021  ? Chronic pain of both shoulders 03/29/2021  ? Drug-induced myopathy 02/21/2021  ? Trigger finger of right hand 11/15/2020  ? Prostate cancer ([place]CC[place]7/11/2020  ? Insomnia 08/01/2019  ? Primary osteoarthritis of both wrists 02/17/2019  ? Trigger middle finger of left hand 02/17/2019  ? Chronic pain syndrome 02/17/2019  ? Chronic radicular lumbar pain 10/08/2018  ? Lumbar radiculopathy 10/08/2018  ? Lumbar degenerative disc disease 10/08/2018  ? Lumbar facet arthropathy 10/08/2018  ? Lumbar facet joint syndrome 10/08/2018  ? Intervertebral disc disorder with radiculopathy of lumbosacral region   ? Pernicious anemia 05/22/2016  ? Paroxysmal atrial fibrillation (HCCGlen Echo Park2/04/2015  ? DJD (degenerative joint disease) of knee 10/27/2014  ? Bilateral carpal tunnel syndrome 10/27/2014  ? Morbid obesity with BMI of 50.0-59.9, adult (HCC[place]0/11/2014  ? Mixed hyperlipidemia 10/27/2014  ? H/O gastric bypass 03/20/2014  ? Hyperkalemia 03/20/2014  ? History of prostate cancer 12/26/2013  ? Essential hypertension 12/26/2013  ? Encounter for anticoagulation discussion and counseling 12/26/2013  ? ? ?  PCP: Olin Hauser, DO ? ?REFERRING PROVIDER: Gillis Santa, MD ? ?REFERRING DIAGNOSIS: M19.011,M19.012 (ICD-10-CM) - Localized osteoarthritis of shoulder regions, bilateral, M65.30 (ICD-10-CM) - Trigger finger of right hand, unspecified finger, M25.511,G89.29,M25.512 (ICD-10-CM) - Chronic pain of both shoulders, M53.3 (ICD-10-CM) - Sacroiliac joint pain, G89.4 (ICD-10-CM) - Chronic pain syndrome ? ?THERAPY DIAG: Chronic left shoulder pain ? ?Chronic right shoulder pain ? ?Other low back pain ? ?Muscle weakness  (generalized) ? ?ONSET DATE: 04/30/19 ? ?FOLLOW UP APPT WITH PROVIDER: Yes  ? ? ?From initial evaluation (04/29/21) ? ?SUBJECTIVE:                                                                                                                                                                                        ? ?Chief Complaint: Bilateral shoulder pain ? ?Pertinent History ?[name] is a 66 y.o. year old, male patient referred to physical therapy by pain management for bilateral shoulder pain. Pt reports chronic bilateral shoulder pain for approximately the last 2 years. The pain started in his R wrist which improved after an injection however the pain gradually started worsening and then moving up his R arm to his R shoulder. Eventually he started having pain in his L shoulder as well. He noticed the most significant pain when lifting his arms to wash under them while bathing. He had injections in both shoulders on March 27th with significant improvement in pain since that time. At this point pt states that he is not having any shoulder pain. He has a past medical history of Cardiomyopathy (in setting of Afib), DJD (degenerative joint disease) of knee, History of kidney stones, Intervertebral disc disorder with radiculopathy of lumbosacral region, Kidney stone (2012), Lower extremity edema, PAF (paroxysmal atrial fibrillation) ([place]), Pernicious anemia, Prostate cancer ([place]), Pulmonary nodule, right, and SVT (supraventricular tachycardia) ([place]). He also  has a past surgical history that includes Knee surgery; Gastric bypass (2002); prostate seeding; Carpal tunnel release (Right, 12/23/2014); Carpal tunnel release (Left, 01/06/2015); Colonoscopy with propofol (N/A, 10/08/2017); and Cardioversion (N/A, 05/15/2019). He has tapered off a lot of his medications over the last few months because he felt like a lot of his symptoms were coming from medication side effects. He has also noted that he started having  anxiety attacks since January which is new for him. Pt and his wife joined [employer] and he recently ordered an upright walker with forearm supports to help his mobility. Pt reports losing 41# in the last 2 years secondary to drop in his calorie intake.  ? ? ?Pain:  ?Pain Intensity: Present: 0/10, Best: 0/10, Worst: 0/10 (not currently hurting) ?Pain  location: Bilateral shoulders ?Pain Quality: sharp and aching  ?Radiating: No  ?Numbness/Tingling: Yes, bilateral hand numbness occasionally as well as bilateral foot numbness.  ?Focal Weakness: No, however pt has noticed weakness in both shoulders and hands. ?Aggravating factors: Lifting arms; ?Relieving factors: injections, pain medication, no benefit with heat/topical creams, unable to take NSAIDs secondary to taking Xarelto ?24-hour pain behavior: Constant pain all the time (not currently); ?History of prior shoulder or neck injury, pain, surgery, or therapy: No ?Falls: Has patient fallen in last 6 months? No,  ?Dominant hand: right ?Imaging: Yes, R shoulder radiographs (03/29/21): Mild glenohumeral and mild acromioclavicular osteoarthritis; L shoulder radiographs (03/29/21):  Mild-to-moderate glenohumeral and mild acromioclavicular osteoarthritis. 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 Otherwise negative ? ?Precautions: None ? ?Weight Bearing Restrictions: No ? ?Living Environment ?Lives with: lives with their spouse ?Lives in: House/apartment ? ?Patient Goals: Improve shoulder strength and prevent pain from returning. Pt would also like to address his back  pain/sciatica; ? ? ?OBJECTIVE:  ? ?Patient Surveys  ?FOTO: 50, predicted improvement to 62 ? ?Cognition ?Patient is oriented to person, place, and time.  ?Recent memory is intact.  ?Remote memory is intact.  ?Attention span and con

## 2021-05-20 ENCOUNTER — Ambulatory Visit
Admission: RE | Admit: 2021-05-20 | Discharge: 2021-05-20 | Disposition: A | Discharge status: Home / Self Care | Payer: Medicare Other | Source: Ambulatory Visit | Attending: Student in an Organized Health Care Education/Training Program | Admitting: Student in an Organized Health Care Education/Training Program

## 2021-05-20 DIAGNOSIS — M5127 Other intervertebral disc displacement, lumbosacral region: Secondary | ICD-10-CM | POA: Diagnosis not present

## 2021-05-20 DIAGNOSIS — M899 Disorder of bone, unspecified: Secondary | ICD-10-CM | POA: Diagnosis not present

## 2021-05-20 DIAGNOSIS — M48061 Spinal stenosis, lumbar region without neurogenic claudication: Secondary | ICD-10-CM | POA: Diagnosis not present

## 2021-05-20 DIAGNOSIS — M47816 Spondylosis without myelopathy or radiculopathy, lumbar region: Secondary | ICD-10-CM | POA: Diagnosis not present

## 2021-05-20 MED ORDER — GADOBUTROL 1 MMOL/ML IV SOLN
10.0000 mL | Freq: Once | INTRAVENOUS | Status: AC | PRN
  Administered 2021-05-20: 10 mL via INTRAVENOUS

## 2021-05-23 ENCOUNTER — Encounter: Payer: Self-pay | Admitting: Student in an Organized Health Care Education/Training Program

## 2021-05-23 ENCOUNTER — Ambulatory Visit
Payer: Medicare Other | Attending: Student in an Organized Health Care Education/Training Program | Admitting: Student in an Organized Health Care Education/Training Program

## 2021-05-23 DIAGNOSIS — M899 Disorder of bone, unspecified: Secondary | ICD-10-CM

## 2021-05-23 DIAGNOSIS — M19012 Primary osteoarthritis, left shoulder: Secondary | ICD-10-CM

## 2021-05-23 DIAGNOSIS — M19011 Primary osteoarthritis, right shoulder: Secondary | ICD-10-CM

## 2021-05-23 DIAGNOSIS — M47816 Spondylosis without myelopathy or radiculopathy, lumbar region: Secondary | ICD-10-CM

## 2021-05-23 DIAGNOSIS — M533 Sacrococcygeal disorders, not elsewhere classified: Secondary | ICD-10-CM | POA: Diagnosis not present

## 2021-05-23 DIAGNOSIS — M6530 Trigger finger, unspecified finger: Secondary | ICD-10-CM | POA: Diagnosis not present

## 2021-05-23 DIAGNOSIS — M25512 Pain in left shoulder: Secondary | ICD-10-CM

## 2021-05-23 DIAGNOSIS — G8929 Other chronic pain: Secondary | ICD-10-CM | POA: Diagnosis not present

## 2021-05-23 DIAGNOSIS — G894 Chronic pain syndrome: Secondary | ICD-10-CM

## 2021-05-23 DIAGNOSIS — M25511 Pain in right shoulder: Secondary | ICD-10-CM

## 2021-05-23 NOTE — Progress Notes (Signed)
Patient: [name]  Service Category: E/M  Provider: Gillis Santa, MD  ?DOB: Nov 28, 1955  DOS: 05/23/2021  Location: Office  ?MRN: [id number]  Setting: Ambulatory outpatient  Referring Provider: Nobie Putnam *  ?Type: Established Patient  Specialty: Interventional Pain Management  PCP: Olin Hauser, DO  ?Location: Remote location  Delivery: TeleHealth    ? ?Virtual Encounter - Pain Management ?PROVIDER NOTE: Information contained herein reflects review and annotations entered in association with encounter. Interpretation of such information and data should be left to medically-trained personnel. Information provided to patient can be located elsewhere in the medical record under "Patient Instructions". Document created using STT-dictation technology, any transcriptional errors that may result from process are unintentional.  ?  ?Contact & Pharmacy ?Preferred: [phone] ?Home: [phone] (home) ?Mobile: [phone] (mobile) ?E-mail: [email or web address]  ?[facility] [address] [facility] - [place], [address] AT [facility] [address] ?[address] [place] [address] ?Phone: [phone] Fax: [phone] ?  ?Pre-screening  ?[name] offered "in-person" vs "virtual" encounter. He indicated preferring virtual for this encounter.  ? ?Reason ?COVID-19*  Social distancing based on CDC and AMA recommendations.  ? ?I contacted [name] on 05/23/2021 via telephone.      I clearly identified myself as Gillis Santa, MD. I verified that I was speaking with the correct person using two identifiers (Name: [name], and date of birth: 1955-08-21). ? ?Consent ?I sought verbal advanced consent from [name] for virtual visit interactions. I informed [name] of possible security and privacy concerns, risks, and limitations associated with providing "not-in-person" medical evaluation and management services. I also informed [name] of the availability of "in-person" appointments.  Finally, I informed him that there would be a charge for the virtual visit and that he could be  personally, fully or partially, financially responsible for it. [name] expressed understanding and agreed to proceed.  ? ?Historic Elements   ?[name] is a 66 y.o. year old, male patient evaluated today after our last contact on 05/12/2021. [name]  has a past medical history of Cardiomyopathy (in setting of Afib), DJD (degenerative joint disease) of knee, History of kidney stones, Intervertebral disc disorder with radiculopathy of lumbosacral region, Kidney stone (2012), Lower extremity edema, PAF (paroxysmal atrial fibrillation) ([place]), Pernicious anemia, Prostate cancer ([place]), Pulmonary nodule, right, and SVT (supraventricular tachycardia) ([place]). He also  has a past surgical history that includes Knee surgery; Gastric bypass (2002); prostate seeding; Carpal tunnel release (Right, 12/23/2014); Carpal tunnel release (Left, 01/06/2015); Colonoscopy with propofol (N/A, 10/08/2017); and Cardioversion (N/A, 05/15/2019). [name] has a current medication list which includes the following prescription(s): acetaminophen, amiodarone, amlodipine, aspirin, benazepril, buspirone, cyanocobalamin, diazepam, duloxetine, fluticasone, furosemide, ibuprofen, melatonin, metoprolol succinate, multivitamin with minerals, rivaroxaban, and trazodone. He  reports that he quit smoking about 42 years ago. His smoking use included cigarettes. He has a 10.00 pack-year smoking history. He has quit using smokeless tobacco.  His smokeless tobacco use included snuff. He reports current alcohol use of about 12.0 standard drinks per week. He reports that he does not use drugs. [name] has No Known Allergies.  ? ?HPI  ?Today, he is being contacted for to review his MRI. ? ?Pharmacotherapy Assessment  ? ?Opioid Analgesic: Hydrocodone 7.5 mg TID PRN     ? ?Monitoring: ?[place] PMP: PDMP not reviewed this encounter.       ?Pharmacotherapy: No  side-effects or adverse reactions reported. ?Compliance: No problems identified. ?Effectiveness: Clinically  acceptable. ?Plan: Refer to "POC". UDS:  ?Summary  ?Date Value Ref Range Status  ?04/06/2020 Note  Final  ?  Comment:  ?  ==================================================================== ?Tox[employer] Select 13 (MW) ?==================================================================== ?Test                             Result       Flag       Units ? ?Drug Present and Declared for Prescription Verification ?  Tramadol                       >4386        EXPECTED   ng/mg creat ?  O-Desmethyltramadol            3353         EXPECTED   ng/mg creat ?  N-Desmethyltramadol            >4386        EXPECTED   ng/mg creat ?   Source of tramadol is a prescription medication. O-desmethyltramadol ?   and N-desmethyltramadol are expected metabolites of tramadol. ? ?==================================================================== ?Test                      Result    Flag   Units      Ref Range ?  Creatinine              114              mg/dL      >=20 ?==================================================================== ?Declared Medications: ? The flagging and interpretation on this report are based on the ? following declared medications.  Unexpected results may arise from ? inaccuracies in the declared medications. ? ? **Note: The testing scope of this panel includes these medications: ? ? Tramadol ? ? **Note: The testing scope of this panel does not include the ? following reported medications: ? ? Acetaminophen (Tylenol) ? Amiodarone ? Amlodipine ? Benazepril ? Cyclobenzaprine (Flexeril) ? Ezetimibe ? Fluticasone ? Furosemide ? Gabapentin ? Menthol ? Methyl Salicylate (topical) ? Metoprolol ? Mirtazapine ? Multivitamin ? Rivaroxaban ? Rosuvastatin ? Trazodone ? Vitamin B12 ?==================================================================== ?For clinical consultation, please call [phone]. ?==================================================================== ?  ?  ? ?Laboratory Chemistry Profile  ? ?Renal ?Lab Results  ?Component Value Date  ? BUN 17 07/21/2020  ? CREATININE 1.21 07/21/2020  ? BCR NOT APPLICABLE 94/50/3888  ? GFRAA 72 07/21/2020  ? GFRNONAA 62 07/21/2020  ?  Hepatic ?Lab Results  ?Component Value Date  ? AST 15 07/21/2020  ? ALT 11 07/21/2020  ? ALBUMIN 4.6 08/01/2019  ? ALKPHOS 72 08/01/2019  ? LIPASE 28 03/13/2017  ?  ?Electrolytes ?Lab Results  ?Component Value Date  ? NA 143 07/21/2020  ? K 4.2 07/21/2020  ? CL 108 07/21/2020  ? CALCIUM 9.0 07/21/2020  ? MG 2.0 02/01/2021  ?  Bone ?Lab Results  ?Component Value Date  ? 25OHVITD1 32 02/01/2021  ? 28MKLKJZ7 <1.0 02/01/2021  ? 25OHVITD3 32 02/01/2021  ?  ?Inflammation (CRP: Acute Phase) (ESR: Chronic Phase) ?No results found for: CRP, ESRSEDRATE, LATICACIDVEN    ?  ? ?Note: Above Lab results reviewed. ? ?Imaging  ?MR LUMBAR SPINE W WO CONTRAST ?CLINICAL DATA:  Low back pain for over 6 weeks. ? ?EXAM: ?MRI LUMBAR SPINE WITHOUT AND WITH CONTRAST ? ?TECHNIQUE: ?Multiplanar and multiecho pulse sequences of the lumbar spine were ?obtained without and with intravenous  contrast. ? ?CONTRAST:  54m GADAVIST GADOBUTROL 1 MMOL/ML IV SOLN ? ?COMPARISON:  CT abdomen 03/13/2017 ? ?FINDINGS: ?Segmentation:  Standard. ? ?Alignment: Grade 1 anterolisthesis of L5 on S1 secondary to ?bilateral L5 pars interarticularis defects. Minimal retrolisthesis ?of L2 on L3 and L4 on L5. ? ?Vertebrae: No acute fracture, evidence of discitis, or aggressive ?bone lesion. Hemangiomas scattered throughout the lumbosacral spine. ?10 mm sclerotic bone lesion in the left lateral aspect of the L2 ?vertebral body and a 7 mm sclerotic bone lesion in the S2 vertebral ?body which may reflect small bone islands versus less likely ?metastatic disease given the appearance on the prior CT dated ?03/13/2017 and no significant interval change. ? ?Conus medullaris and  cauda equina: Conus extends to the L1 level. ?Conus and cauda equina appear normal. ? ?Paraspinal and other soft tissues: No acute paraspinal abnormality. ? ?Disc levels: ? ?Disc spaces: Disc desiccation throughout the lumbar spine.

## 2021-05-24 ENCOUNTER — Telehealth (INDEPENDENT_AMBULATORY_CARE_PROVIDER_SITE_OTHER): Payer: Medicare Other | Admitting: Family Medicine

## 2021-05-24 ENCOUNTER — Encounter: Payer: Self-pay | Admitting: Family Medicine

## 2021-05-24 VITALS — Wt 331.0 lb

## 2021-05-24 DIAGNOSIS — M51369 Other intervertebral disc degeneration, lumbar region without mention of lumbar back pain or lower extremity pain: Secondary | ICD-10-CM

## 2021-05-24 DIAGNOSIS — M5136 Other intervertebral disc degeneration, lumbar region: Secondary | ICD-10-CM

## 2021-05-24 DIAGNOSIS — R937 Abnormal findings on diagnostic imaging of other parts of musculoskeletal system: Secondary | ICD-10-CM | POA: Diagnosis not present

## 2021-05-24 NOTE — Therapy (Signed)
?OUTPATIENT PHYSICAL THERAPY SHOULDER/BACK TREATMENT ? ? ?Patient Name: [name] ?MRN: [id number] ?DOB:02-07-1955, 66 y.o., male ?Today's Date: 05/26/2021 ? ? PT End of Session - 05/25/21 1358   ? ? Visit Number 8   ? Number of Visits 25   ? Date for PT Re-Evaluation 07/22/21   ? Authorization Type eval: 04/29/21   ? PT Start Time 1358   ? PT Stop Time 8938   ? PT Time Calculation (min) 45 min   ? Activity Tolerance Patient tolerated treatment well   ? Behavior During Therapy [facility] for tasks assessed/performed   ? ?  ?  ? ?  ? ? ? ? ? ? ? ? ?Past Medical History:  ?Diagnosis Date  ? Cardiomyopathy (in setting of Afib)   ? a. 12/2013 Echo: EF 45-50%, mild ant and antsept HK. mild MR. Mod dil LA. nl RV fxn. Rhythm was Afib.  ? DJD (degenerative joint disease) of knee   ? History of kidney stones   ? Intervertebral disc disorder with radiculopathy of lumbosacral region   ? Kidney stone 2012  ? Lower extremity edema   ? PAF (paroxysmal atrial fibrillation) ([place])   ? Pernicious anemia   ? Prostate cancer ([place])   ? Pulmonary nodule, right   ? a. 10/2014 Cardiac CTA: 25m RLL nodule; b. 04/2015 CT Chest: stable 769mRLL nodule. No new nodules; 02/2017 CTA Chest: stable, benign, 52m83mLL pulm nodule.  ? SVT (supraventricular tachycardia) ([place] ? ?Past Surgical History:  ?Procedure Laterality Date  ? CARDIOVERSION N/A 05/15/2019  ? Procedure: CARDIOVERSION;  Surgeon: GolMinna MerrittsD;  Location: [facility] ORS;  Service: Cardiovascular;  Laterality: N/A;  ? CARPAL TUNNEL RELEASE Right 12/23/2014  ? Procedure: CARPAL TUNNEL RELEASE;  Surgeon: HowEarnestine LeysD;  Location: [facility] ORS;  Service: Orthopedics;  Laterality: Right;  ? CARPAL TUNNEL RELEASE Left 01/06/2015  ? Procedure: CARPAL TUNNEL RELEASE;  Surgeon: HowEarnestine LeysD;  Location: [facility] ORS;  Service: Orthopedics;  Laterality: Left;  ? COLONOSCOPY WITH PROPOFOL N/A 10/08/2017  ? Procedure: COLONOSCOPY WITH PROPOFOL;  Surgeon: VanLin LandsmanD;  [facility];  Service: Gastroenterology;  Laterality: N/A;  ? GASTRIC BYPASS  2002  ? KNEE SURGERY    ? knee trauma  ? prostate seeding    ? ?Patient Active Problem List  ? Diagnosis Date Noted  ? Lesion of bone of lumbosacral spine (L5) 05/12/2021  ? Localized osteoarthritis of shoulder regions, bilateral 03/29/2021  ? Chronic pain of both shoulders 03/29/2021  ? Drug-induced myopathy 02/21/2021  ? Trigger finger of right hand 11/15/2020  ? Prostate cancer ([place]/11/2020  ? Insomnia 08/01/2019  ? Primary osteoarthritis of both wrists 02/17/2019  ? Trigger middle finger of left hand 02/17/2019  ? Chronic pain syndrome 02/17/2019  ? Chronic radicular lumbar pain 10/08/2018  ? Lumbar radiculopathy 10/08/2018  ? Lumbar degenerative disc disease 10/08/2018  ? Lumbar facet arthropathy 10/08/2018  ? Lumbar facet joint syndrome 10/08/2018  ? Intervertebral disc disorder with radiculopathy of lumbosacral region   ? Pernicious anemia 05/22/2016  ? Paroxysmal atrial fibrillation ([facility]CC[place]2/04/2015  ? DJD (degenerative joint disease) of knee 10/27/2014  ? Bilateral carpal tunnel syndrome 10/27/2014  ? Morbid obesity with BMI of 50.0-59.9, adult ([place]CC[place]0/11/2014  ? Mixed hyperlipidemia 10/27/2014  ? H/O gastric bypass 03/20/2014  ? Hyperkalemia 03/20/2014  ? History of prostate cancer 12/26/2013  ? Essential hypertension 12/26/2013  ? Encounter for anticoagulation discussion and counseling 12/26/2013  ? ? ?  PCP: Olin Hauser, DO ? ?REFERRING PROVIDER: Gillis Santa, MD ? ?REFERRING DIAGNOSIS: M19.011,M19.012 (ICD-10-CM) - Localized osteoarthritis of shoulder regions, bilateral, M65.30 (ICD-10-CM) - Trigger finger of right hand, unspecified finger, M25.511,G89.29,M25.512 (ICD-10-CM) - Chronic pain of both shoulders, M53.3 (ICD-10-CM) - Sacroiliac joint pain, G89.4 (ICD-10-CM) - Chronic pain syndrome ? ?THERAPY DIAG: Chronic left shoulder pain ? ?Chronic right shoulder pain ? ?Other low back pain ? ?Muscle weakness  (generalized) ? ?ONSET DATE: 04/30/19 ? ?FOLLOW UP APPT WITH PROVIDER: Yes  ? ? ?From initial evaluation (04/29/21) ? ?SUBJECTIVE:                                                                                                                                                                                        ? ?Chief Complaint: Bilateral shoulder pain ? ?Pertinent History ?[name] is a 66 y.o. year old, male patient referred to physical therapy by pain management for bilateral shoulder pain. Pt reports chronic bilateral shoulder pain for approximately the last 2 years. The pain started in his R wrist which improved after an injection however the pain gradually started worsening and then moving up his R arm to his R shoulder. Eventually he started having pain in his L shoulder as well. He noticed the most significant pain when lifting his arms to wash under them while bathing. He had injections in both shoulders on March 27th with significant improvement in pain since that time. At this point pt states that he is not having any shoulder pain. He has a past medical history of Cardiomyopathy (in setting of Afib), DJD (degenerative joint disease) of knee, History of kidney stones, Intervertebral disc disorder with radiculopathy of lumbosacral region, Kidney stone (2012), Lower extremity edema, PAF (paroxysmal atrial fibrillation) ([place]), Pernicious anemia, Prostate cancer ([place]), Pulmonary nodule, right, and SVT (supraventricular tachycardia) ([place]). He also  has a past surgical history that includes Knee surgery; Gastric bypass (2002); prostate seeding; Carpal tunnel release (Right, 12/23/2014); Carpal tunnel release (Left, 01/06/2015); Colonoscopy with propofol (N/A, 10/08/2017); and Cardioversion (N/A, 05/15/2019). He has tapered off a lot of his medications over the last few months because he felt like a lot of his symptoms were coming from medication side effects. He has also noted that he started having  anxiety attacks since January which is new for him. Pt and his wife joined [employer] and he recently ordered an upright walker with forearm supports to help his mobility. Pt reports losing 41# in the last 2 years secondary to drop in his calorie intake.  ? ? ?Pain:  ?Pain Intensity: Present: 0/10, Best: 0/10, Worst: 0/10 (not currently hurting) ?Pain  location: Bilateral shoulders ?Pain Quality: sharp and aching  ?Radiating: No  ?Numbness/Tingling: Yes, bilateral hand numbness occasionally as well as bilateral foot numbness.  ?Focal Weakness: No, however pt has noticed weakness in both shoulders and hands. ?Aggravating factors: Lifting arms; ?Relieving factors: injections, pain medication, no benefit with heat/topical creams, unable to take NSAIDs secondary to taking Xarelto ?24-hour pain behavior: Constant pain all the time (not currently); ?History of prior shoulder or neck injury, pain, surgery, or therapy: No ?Falls: Has patient fallen in last 6 months? No,  ?Dominant hand: right ?Imaging: Yes, R shoulder radiographs (03/29/21): Mild glenohumeral and mild acromioclavicular osteoarthritis; L shoulder radiographs (03/29/21):  Mild-to-moderate glenohumeral and mild acromioclavicular osteoarthritis. 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 Otherwise negative ? ?Precautions: None ? ?Weight Bearing Restrictions: No ? ?Living Environment ?Lives with: lives with their spouse ?Lives in: House/apartment ? ?Patient Goals: Improve shoulder strength and prevent pain from returning. Pt would also like to address his back  pain/sciatica; ? ? ?OBJECTIVE:  ? ?Patient Surveys  ?FOTO: 50, predicted improvement to 62 ? ?Cognition ?Patient is oriented to person, place, and time.  ?Recent memory is intact.  ?Remote memory is intact.  ?Attention span and

## 2021-05-24 NOTE — Patient Instructions (Addendum)
   Please schedule a Follow-up Appointment to: Return if symptoms worsen or fail to improve.  If you have any other questions or concerns, please feel free to call the office or send a message through MyChart. You may also schedule an earlier appointment if necessary.  Additionally, you may be receiving a survey about your experience at our office within a few days to 1 week by e-mail or mail. We value your feedback.  Mliss Wedin, DO [facility], [facility] 

## 2021-05-24 NOTE — Progress Notes (Signed)
Virtual Visit via Telephone ?The purpose of this virtual visit is to provide medical care while limiting exposure to the novel coronavirus (COVID19) for both patient and office staff. ? ?Consent was obtained for phone visit:  Yes.   ?Answered questions that patient had about telehealth interaction:  Yes.   ?I discussed the limitations, risks, security and privacy concerns of performing an evaluation and management service by telephone. I also discussed with the patient that there may be a patient responsible charge related to this service. The patient expressed understanding and agreed to proceed. ? ?Patient Location: Home ?Provider Location: [facility] (Office) ? ?Participants in virtual visit: ?- Patient: [name] ?- CMA: Orinda Kenner, CMA ?- Provider: Dr Parks Ranger ? ?---------------------------------------------------------------------- ?Chief Complaint  ?Patient presents with  ? Back Pain  ? Follow from Lumbar MRI  ? ? ?S: Reviewed CMA documentation. I have called patient and gathered additional HPI as follows: ? ?Follow up Sclerotic Bone Lesion L5 ?Chronic Back Pain ?[facility] Dr Holley Raring ?  ?Followed with PT. Prior MRI done at [facility] per Dr Holley Raring orders. MRI was done 09/12/18 see report below. ?  ?RAF Ablation in back significant improvement. He was off medication and did well it controlled his pain. ?  ?Then back pain had returned in mid section of back. Described moderate to severe pain with stiffness in back muscles. Would get some tense back muscles and pain worse with activity and worse in evening. ?  ?PT reviewed MRI with concern of sclerotic lesion of bone L5 back from 08/2018 ?  ?Interval update he has returned to Dr Holley Raring, new MRI Lumbar was ordered and completed 05/20/21, see results below. ? ?He wants to review the results today. It shows some hemangiomas and sclerotic lesion < 179m various other spots. No obvious evidence of metastatic or aggressive bone  lesions seen. It was not compared to prior MRI [facility], [facility] ?  ?History of Prostate Cancer ?Previously seen by Dr [facility] - treated approx 2017 by seed implant, has had negative PSA since that time and done well, no longer followed by Urology. ?Last PSA 0.04, negative (07/21/20) ?  ? ?Past Medical History:  ?Diagnosis Date  ? Cardiomyopathy (in setting of Afib)   ? a. 12/2013 Echo: EF 45-50%, mild ant and antsept HK. mild MR. Mod dil LA. nl RV fxn. Rhythm was Afib.  ? DJD (degenerative joint disease) of knee   ? History of kidney stones   ? Intervertebral disc disorder with radiculopathy of lumbosacral region   ? Kidney stone 2012  ? Lower extremity edema   ? PAF (paroxysmal atrial fibrillation) ([place]   ? Pernicious anemia   ? [place] cancer ([place]   ? Pulmonary nodule, right   ? a. 10/2014 Cardiac CTA: 730mRLL nodule; b. 04/2015 CT Chest: stable 79m36mLL nodule. No new nodules; 02/2017 CTA Chest: stable, benign, 79mm379mL pulm nodule.  ? SVT (supraventricular tachycardia) ([place]? ?Social History  ? ?Tobacco Use  ? Smoking status: Former  ?  Packs/day: 1.00  ?  Years: 10.00  ?  Pack years: 10.00  ?  Types: Cigarettes  ?  Quit date: 02/18/1979  ?  Years since quitting: 42.2  ? Smokeless tobacco: Former  ?  Types: Snuff  ?Vaping Use  ? Vaping Use: Never used  ?Substance Use Topics  ? Alcohol use: Yes  ?  Alcohol/week:  12.0 standard drinks  ?  Types: 12 Cans of beer per week  ?  Comment: weekly  ? Drug use: No  ? ? ?Current Outpatient Medications:  ?  acetaminophen (TYLENOL) 500 MG tablet, Take 1,000 mg by mouth every 6 (six) hours as needed for moderate pain or headache., Disp: , Rfl:  ?  amiodarone (PACERONE) 200 MG tablet, TAKE ONE TABLET BY MOUTH ONE TIME DAILY, Disp: 90 tablet, Rfl: 2 ?  amLODipine (NORVASC) 5 MG tablet, Take 1 tablet (5 mg total) by mouth daily., Disp: 90 tablet, Rfl: 3 ?  Aspirin 325 MG CAPS, Take 325 mg by mouth daily., Disp: , Rfl:   ?  benazepril (LOTENSIN) 40 MG tablet, TAKE 1 TABLET(40 MG) BY MOUTH DAILY, Disp: 90 tablet, Rfl: 3 ?  busPIRone (BUSPAR) 10 MG tablet, Take 1 tablet (10 mg total) by mouth 4 (four) times daily as needed (anxiety)., Disp: 360 tablet, Rfl: 1 ?  cyanocobalamin (,VITAMIN B-12,) 1000 MCG/ML injection, INJECT 1ML EVERY 30 DAYS AS DIRECTED, Disp: 1 mL, Rfl: PRN ?  diazepam (VALIUM) 5 MG tablet, Take 1-2 tablets (5-10 mg total) by mouth 60 (sixty) minutes before procedure for 2 doses. Take one (1) tab (5 mg) 60 minutes before scheduled MRI Wait 30 minutes. If still anxious, take the second (5 mg) tab 30 min before MRI, Disp: 2 tablet, Rfl: 0 ?  DULoxetine (CYMBALTA) 30 MG capsule, Take 1 capsule (30 mg total) by mouth daily., Disp: 90 capsule, Rfl: 1 ?  fluticasone (FLONASE) 50 MCG/ACT nasal spray, USE TWO SPRAYS IN THE AFFECTED NOSTRIL ONE TIME DAILY, Disp: 16 mL, Rfl: 0 ?  furosemide (LASIX) 20 MG tablet, Take 1 tablet (20 mg total) by mouth daily as needed., Disp: 90 tablet, Rfl: 3 ?  Ibuprofen 200 MG CAPS, Take 400 mg by mouth at bedtime as needed., Disp: , Rfl:  ?  melatonin 3 MG TABS tablet, Take 3 mg by mouth at bedtime., Disp: , Rfl:  ?  metoprolol succinate (TOPROL-XL) 50 MG 24 hr tablet, Take 0.5 tablets (25 mg total) by mouth daily. TAKE 1 TABLET(50 MG) BY MOUTH DAILY WITH OR IMMEDIATELY FOLLOWING A MEAL, Disp: 45 tablet, Rfl: 3 ?  Multiple Vitamin (MULTIVITAMIN WITH MINERALS) TABS tablet, Take 1 tablet by mouth daily., Disp: , Rfl:  ?  rivaroxaban (XARELTO) 20 MG TABS tablet, Take 1 tablet (20 mg total) by mouth daily with supper., Disp: 90 tablet, Rfl: 1 ?  traZODone (DESYREL) 150 MG tablet, Take 1 tablet (150 mg total) by mouth at bedtime., Disp: 90 tablet, Rfl: 3 ? ? ?  05/10/2021  ?  4:05 PM 04/11/2021  ?  9:34 AM 03/01/2021  ? 11:01 AM  ?Depression screen PHQ 2/9  ?Decreased Interest 0 0 0  ?Down, Depressed, Hopeless 0 0 0  ?PHQ - 2 Score 0 0 0  ?Altered sleeping 1  1  ?Tired, decreased energy 0  0  ?Change  in appetite 0  0  ?Feeling bad or failure about yourself  0  0  ?Trouble concentrating 0  0  ?Moving slowly or fidgety/restless 0  0  ?Suicidal thoughts 0  0  ?PHQ-9 Score 1  1  ?Difficult doing work/chores Not difficult at all  Not difficult at all  ? ? ? ?  05/10/2021  ?  4:05 PM 03/01/2021  ? 11:01 AM 02/21/2021  ?  9:33 AM 08/01/2019  ?  4:54 PM  ?GAD 7 : Generalized Anxiety Score  ?Nervous, Anxious, on  Edge 0 0 0 0  ?Control/stop worrying 0 0 0 0  ?Worry too much - different things 0 0 0 0  ?Trouble relaxing 0 0 0 0  ?Restless 0 0 0 0  ?Easily annoyed or irritable 0 0 0 0  ?Afraid - awful might happen 0 0 0 0  ?Total GAD 7 Score 0 0 0 0  ?Anxiety Difficulty Not difficult at all Not difficult at all Not difficult at all Not difficult at all  ? ? ?-------------------------------------------------------------------------- ?O: No physical exam performed due to remote telephone encounter. ? ?Lab results reviewed. ? ? ?I have personally reviewed the radiology report from 05/20/21 on Lumbar MRI. ? ?CLINICAL DATA:  Low back pain for over 6 weeks. ?  ?EXAM: ?MRI LUMBAR SPINE WITHOUT AND WITH CONTRAST ?  ?TECHNIQUE: ?Multiplanar and multiecho pulse sequences of the lumbar spine were ?obtained without and with intravenous contrast. ?  ?CONTRAST:  37m GADAVIST GADOBUTROL 1 MMOL/ML IV SOLN ?  ?COMPARISON:  CT abdomen 03/13/2017 ?  ?FINDINGS: ?Segmentation:  Standard. ?  ?Alignment: Grade 1 anterolisthesis of L5 on S1 secondary to ?bilateral L5 pars interarticularis defects. Minimal retrolisthesis ?of L2 on L3 and L4 on L5. ?  ?Vertebrae: No acute fracture, evidence of discitis, or aggressive ?bone lesion. Hemangiomas scattered throughout the lumbosacral spine. ?10 mm sclerotic bone lesion in the left lateral aspect of the L2 ?vertebral body and a 7 mm sclerotic bone lesion in the S2 vertebral ?body which may reflect small bone islands versus less likely ?metastatic disease given the appearance on the prior CT dated ?03/13/2017  and no significant interval change. ?  ?Conus medullaris and cauda equina: Conus extends to the L1 level. ?Conus and cauda equina appear normal. ?  ?Paraspinal and other soft tissues: No acute paraspinal

## 2021-05-25 ENCOUNTER — Ambulatory Visit: Payer: Medicare Other

## 2021-05-25 DIAGNOSIS — M5459 Other low back pain: Secondary | ICD-10-CM

## 2021-05-25 DIAGNOSIS — M6281 Muscle weakness (generalized): Secondary | ICD-10-CM

## 2021-05-25 DIAGNOSIS — M25512 Pain in left shoulder: Secondary | ICD-10-CM | POA: Diagnosis not present

## 2021-05-25 DIAGNOSIS — G8929 Other chronic pain: Secondary | ICD-10-CM

## 2021-05-25 DIAGNOSIS — M25511 Pain in right shoulder: Secondary | ICD-10-CM | POA: Diagnosis not present

## 2021-05-25 NOTE — Patient Instructions (Signed)
______________________________________________________________________  Preparing for your procedure (without sedation)  Procedure appointments are limited to planned procedures: No Prescription Refills. No disability issues will be discussed. No medication changes will be discussed.  Instructions: Food Intake: Avoid eating anything for at least 4 hours prior to your procedure. Transportation: Unless otherwise stated by your physician, bring a driver. Morning Medicines: Take all of your scheduled morning medications. If you take heart medicine, except for blood thinners, do not forget to take it the morning of the procedure. If your Diastolic (lower reading) is above 100 mmHg, elective cases will be cancelled/rescheduled. Blood thinners: These will need to be stopped for procedures. Notify our staff if you are taking any blood thinners. Depending on which one you take, there will be specific instructions on how and when to stop it. Diabetics on insulin: Notify the staff so that you can be scheduled 1st case in the morning. If your diabetes requires high dose insulin, take only  of your normal insulin dose the morning of the procedure and notify the staff that you have done so. Preventing infections: Shower with an antibacterial soap the morning of your procedure.  Build-up your immune system: Take 1000 mg of Vitamin C with every meal (3 times a day) the day prior to your procedure. Antibiotics: Inform the staff if you have a condition or reason that requires you to take antibiotics before dental procedures. Pregnancy: If you are pregnant, call and cancel the procedure. Sickness: If you have a cold, fever, or any active infections, call and cancel the procedure. Arrival: You must be in the facility at least 30 minutes prior to your scheduled procedure. Children: Do not bring any children with you. Dress appropriately: There is always a possibility that your clothing may get soiled. Valuables:  Do not bring any jewelry or valuables.  Reasons to call and reschedule or cancel your procedure: (Following these recommendations will minimize the risk of a serious complication.) Surgeries: Avoid having procedures within 2 weeks of any surgery. (Avoid for 2 weeks before or after any surgery). Flu Shots: Avoid having procedures within 2 weeks of a flu shots or . (Avoid for 2 weeks before or after immunizations). Barium: Avoid having a procedure within 7-10 days after having had a radiological study involving the use of radiological contrast. (Myelograms, Barium swallow or enema study). Heart attacks: Avoid any elective procedures or surgeries for the initial 6 months after a "Myocardial Infarction" (Heart Attack). Blood thinners: It is imperative that you stop these medications before procedures. Let us know if you if you take any blood thinner.  Infection: Avoid procedures during or within two weeks of an infection (including chest colds or gastrointestinal problems). Symptoms associated with infections include: Localized redness, fever, chills, night sweats or profuse sweating, burning sensation when voiding, cough, congestion, stuffiness, runny nose, sore throat, diarrhea, nausea, vomiting, cold or Flu symptoms, recent or current infections. It is specially important if the infection is over the area that we intend to treat. Heart and lung problems: Symptoms that may suggest an active cardiopulmonary problem include: cough, chest pain, breathing difficulties or shortness of breath, dizziness, ankle swelling, uncontrolled high or unusually low blood pressure, and/or palpitations. If you are experiencing any of these symptoms, cancel your procedure and contact your primary care physician for an evaluation.  Remember:  Regular Business hours are:  Monday to Thursday 8:00 AM to 4:00 PM  Provider's Schedule: Francisco Naveira, MD:  Procedure days: Tuesday and Thursday 7:30 AM to 4:00 PM    Bilal  Lateef, MD:  Procedure days: Monday and Wednesday 7:30 AM to 4:00 PM ______________________________________________________________________   

## 2021-05-27 ENCOUNTER — Ambulatory Visit: Payer: Medicare Other

## 2021-05-27 DIAGNOSIS — M25512 Pain in left shoulder: Secondary | ICD-10-CM | POA: Diagnosis not present

## 2021-05-27 DIAGNOSIS — G8929 Other chronic pain: Secondary | ICD-10-CM | POA: Diagnosis not present

## 2021-05-27 DIAGNOSIS — M5459 Other low back pain: Secondary | ICD-10-CM | POA: Diagnosis not present

## 2021-05-27 DIAGNOSIS — M6281 Muscle weakness (generalized): Secondary | ICD-10-CM | POA: Diagnosis not present

## 2021-05-27 DIAGNOSIS — M25511 Pain in right shoulder: Secondary | ICD-10-CM | POA: Diagnosis not present

## 2021-05-27 NOTE — Therapy (Signed)
?OUTPATIENT PHYSICAL THERAPY SHOULDER/BACK TREATMENT ? ? ?Patient Name: [name] ?MRN: [id number] ?DOB:1955/11/27, 66 y.o., male ?Today's Date: 05/27/2021 ? ? PT End of Session - 05/27/21 1021   ? ? Visit Number 9   ? Number of Visits 25   ? Date for PT Re-Evaluation 07/22/21   ? Authorization Type eval: 04/29/21   ? PT Start Time 1017   ? PT Stop Time 1100   ? PT Time Calculation (min) 43 min   ? Activity Tolerance Patient tolerated treatment well   ? Behavior During Therapy [facility] for tasks assessed/performed   ? ?  ?  ? ?  ? ? ? ?Past Medical History:  ?Diagnosis Date  ? Cardiomyopathy (in setting of Afib)   ? a. 12/2013 Echo: EF 45-50%, mild ant and antsept HK. mild MR. Mod dil LA. nl RV fxn. Rhythm was Afib.  ? DJD (degenerative joint disease) of knee   ? History of kidney stones   ? Intervertebral disc disorder with radiculopathy of lumbosacral region   ? Kidney stone 2012  ? Lower extremity edema   ? PAF (paroxysmal atrial fibrillation) ([place])   ? Pernicious anemia   ? Prostate cancer ([place])   ? Pulmonary nodule, right   ? a. 10/2014 Cardiac CTA: 40m RLL nodule; b. 04/2015 CT Chest: stable 7120mRLL nodule. No new nodules; 02/2017 CTA Chest: stable, benign, 20m14mLL pulm nodule.  ? SVT (supra[place]ricular tachycardia) ([place] ? ?Past Surgical History:  ?Procedure Laterality Date  ? CARDIOVERSION N/A 05/15/2019  ? Procedure: CARDIOVERSION;  Surgeon: GolMinna MerrittsD;  Location: [facility] ORS;  Service: Cardiovascular;  Laterality: N/A;  ? CARPAL TUNNEL RELEASE Right 12/23/2014  ? Procedure: CARPAL TUNNEL RELEASE;  Surgeon: HowEarnestine LeysD;  Location: [facility] ORS;  Service: Orthopedics;  Laterality: Right;  ? CARPAL TUNNEL RELEASE Left 01/06/2015  ? Procedure: CARPAL TUNNEL RELEASE;  Surgeon: HowEarnestine LeysD;  Location: [facility] ORS;  Service: Orthopedics;  Laterality: Left;  ? COLONOSCOPY WITH PROPOFOL N/A 10/08/2017  ? Procedure: COLONOSCOPY WITH PROPOFOL;  Surgeon: VanLin LandsmanD;  [facility];   Service: Gastroenterology;  Laterality: N/A;  ? GASTRIC BYPASS  2002  ? KNEE SURGERY    ? knee trauma  ? prostate seeding    ? ?Patient Active Problem List  ? Diagnosis Date Noted  ? Lesion of bone of lumbosacral spine (L5) 05/12/2021  ? Localized osteoarthritis of shoulder regions, bilateral 03/29/2021  ? Chronic pain of both shoulders 03/29/2021  ? Drug-induced myopathy 02/21/2021  ? Trigger finger of right hand 11/15/2020  ? Prostate cancer ([place]7/11/2020  ? Insomnia 08/01/2019  ? Primary osteoarthritis of both wrists 02/17/2019  ? Trigger middle finger of left hand 02/17/2019  ? Chronic pain syndrome 02/17/2019  ? Chronic radicular lumbar pain 10/08/2018  ? Lumbar radiculopathy 10/08/2018  ? Lumbar degenerative disc disease 10/08/2018  ? Lumbar facet arthropathy 10/08/2018  ? Lumbar facet joint syndrome 10/08/2018  ? Intervertebral disc disorder with radiculopathy of lumbosacral region   ? Pernicious anemia 05/22/2016  ? Paroxysmal atrial fibrillation ([place]CC[place]2/04/2015  ? DJD (degenerative joint disease) of knee 10/27/2014  ? Bilateral carpal tunnel syndrome 10/27/2014  ? Morbid obesity with BMI of 50.0-59.9, adult (HCC[place]0/11/2014  ? Mixed hyperlipidemia 10/27/2014  ? H/O gastric bypass 03/20/2014  ? Hyperkalemia 03/20/2014  ? History of prostate cancer 12/26/2013  ? Essential hypertension 12/26/2013  ? Encounter for anticoagulation discussion and counseling 12/26/2013  ? ? ?PCP: KarNobie Putnam  J, DO ? ?REFERRING PROVIDER: Gillis Santa, MD ? ?REFERRING DIAGNOSIS: M19.011,M19.012 (ICD-10-CM) - Localized osteoarthritis of shoulder regions, bilateral, M65.30 (ICD-10-CM) - Trigger finger of right hand, unspecified finger, M25.511,G89.29,M25.512 (ICD-10-CM) - Chronic pain of both shoulders, M53.3 (ICD-10-CM) - Sacroiliac joint pain, G89.4 (ICD-10-CM) - Chronic pain syndrome ? ?THERAPY DIAG: Other low back pain ? ?Chronic left shoulder pain ? ?Chronic right shoulder pain ? ?ONSET DATE: 04/30/19 ? ?FOLLOW UP  APPT WITH PROVIDER: Yes  ? ? ?From initial evaluation (04/29/21) ? ?SUBJECTIVE:                                                                                                                                                                                        ? ?Chief Complaint: Bilateral shoulder pain ? ?Pertinent History ?[name] is a 66 y.o. year old, male patient referred to physical therapy by pain management for bilateral shoulder pain. Pt reports chronic bilateral shoulder pain for approximately the last 2 years. The pain started in his R wrist which improved after an injection however the pain gradually started worsening and then moving up his R arm to his R shoulder. Eventually he started having pain in his L shoulder as well. He noticed the most significant pain when lifting his arms to wash under them while bathing. He had injections in both shoulders on March 27th with significant improvement in pain since that time. At this point pt states that he is not having any shoulder pain. He has a past medical history of Cardiomyopathy (in setting of Afib), DJD (degenerative joint disease) of knee, History of kidney stones, Intervertebral disc disorder with radiculopathy of lumbosacral region, Kidney stone (2012), Lower extremity edema, PAF (paroxysmal atrial fibrillation) ([place]), Pernicious anemia, Prostate cancer ([place]), Pulmonary nodule, right, and SVT (supraventricular tachycardia) ([place]). He also  has a past surgical history that includes Knee surgery; Gastric bypass (2002); prostate seeding; Carpal tunnel release (Right, 12/23/2014); Carpal tunnel release (Left, 01/06/2015); Colonoscopy with propofol (N/A, 10/08/2017); and Cardioversion (N/A, 05/15/2019). He has tapered off a lot of his medications over the last few months because he felt like a lot of his symptoms were coming from medication side effects. He has also noted that he started having anxiety attacks since January which is new for him. Pt  and his wife joined [employer] and he recently ordered an upright walker with forearm supports to help his mobility. Pt reports losing 41# in the last 2 years secondary to drop in his calorie intake.  ? ? ?Pain:  ?Pain Intensity: Present: 0/10, Best: 0/10, Worst: 0/10 (not currently hurting) ?Pain location: Bilateral shoulders ?Pain Quality: sharp and  aching  ?Radiating: No  ?Numbness/Tingling: Yes, bilateral hand numbness occasionally as well as bilateral foot numbness.  ?Focal Weakness: No, however pt has noticed weakness in both shoulders and hands. ?Aggravating factors: Lifting arms; ?Relieving factors: injections, pain medication, no benefit with heat/topical creams, unable to take NSAIDs secondary to taking Xarelto ?24-hour pain behavior: Constant pain all the time (not currently); ?History of prior shoulder or neck injury, pain, surgery, or therapy: No ?Falls: Has patient fallen in last 6 months? No,  ?Dominant hand: right ?Imaging: Yes, R shoulder radiographs (03/29/21): Mild glenohumeral and mild acromioclavicular osteoarthritis; L shoulder radiographs (03/29/21):  Mild-to-moderate glenohumeral and mild acromioclavicular osteoarthritis. 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 Otherwise negative ? ?Precautions: None ? ?Weight Bearing Restrictions: No ? ?Living Environment ?Lives with: lives with their spouse ?Lives in: House/apartment ? ?Patient Goals: Improve shoulder strength and prevent pain from returning. Pt would also like to address his back pain/sciatica; ? ? ?OBJECTIVE:  ? ?Patient Surveys  ?FOTO: 50,  predicted improvement to 62 ? ?Cognition ?Patient is oriented to person, place, and time.  ?Recent memory is intact.  ?Remote memory is intact.  ?Attention span and concentration are intact.  ?Expressive spee

## 2021-05-29 ENCOUNTER — Other Ambulatory Visit: Payer: Self-pay | Admitting: Cardiovascular Disease

## 2021-05-30 ENCOUNTER — Ambulatory Visit: Payer: Medicare Other

## 2021-05-30 DIAGNOSIS — M25511 Pain in right shoulder: Secondary | ICD-10-CM | POA: Diagnosis not present

## 2021-05-30 DIAGNOSIS — M6281 Muscle weakness (generalized): Secondary | ICD-10-CM | POA: Diagnosis not present

## 2021-05-30 DIAGNOSIS — M25512 Pain in left shoulder: Secondary | ICD-10-CM | POA: Diagnosis not present

## 2021-05-30 DIAGNOSIS — G8929 Other chronic pain: Secondary | ICD-10-CM | POA: Diagnosis not present

## 2021-05-30 DIAGNOSIS — M5459 Other low back pain: Secondary | ICD-10-CM | POA: Diagnosis not present

## 2021-05-30 NOTE — Therapy (Signed)
?OUTPATIENT PHYSICAL THERAPY SHOULDER/BACK TREATMENT/PROGRESS NOTE ? ?Dates of reporting period  04/29/21   to   05/30/21  ? ? ?Patient Name: [name] ?MRN: [id number] ?DOB:10-28-1955, 66 y.o., male ?Today's Date: 05/31/2021 ? ? PT End of Session - 05/30/21 1400   ? ? Visit Number 10   ? Number of Visits 25   ? Date for PT Re-Evaluation 07/22/21   ? Authorization Type eval: 04/29/21   ? PT Start Time 1400   ? PT Stop Time 4315   ? PT Time Calculation (min) 45 min   ? Activity Tolerance Patient tolerated treatment well   ? Behavior During Therapy [facility] for tasks assessed/performed   ? ?  ?  ? ?  ? ? ? ? ?Past Medical History:  ?Diagnosis Date  ? Cardiomyopathy (in setting of Afib)   ? a. 12/2013 Echo: EF 45-50%, mild ant and antsept HK. mild MR. Mod dil LA. nl RV fxn. Rhythm was Afib.  ? DJD (degenerative joint disease) of knee   ? History of kidney stones   ? Intervertebral disc disorder with radiculopathy of lumbosacral region   ? Kidney stone 2012  ? Lower extremity edema   ? PAF (paroxysmal atrial fibrillation) ([place])   ? Pernicious anemia   ? Prostate cancer ([place])   ? Pulmonary nodule, right   ? a. 10/2014 Cardiac CTA: 83m RLL nodule; b. 04/2015 CT Chest: stable 741mRLL nodule. No new nodules; 02/2017 CTA Chest: stable, benign, 60m160mLL pulm nodule.  ? SVT (supraventricular tachycardia) ([place] ? ?Past Surgical History:  ?Procedure Laterality Date  ? CARDIOVERSION N/A 05/15/2019  ? Procedure: CARDIOVERSION;  Surgeon: GolMinna MerrittsD;  Location: [facility] ORS;  Service: Cardiovascular;  Laterality: N/A;  ? CARPAL TUNNEL RELEASE Right 12/23/2014  ? Procedure: CARPAL TUNNEL RELEASE;  Surgeon: HowEarnestine LeysD;  Location: [facility] ORS;  Service: Orthopedics;  Laterality: Right;  ? CARPAL TUNNEL RELEASE Left 01/06/2015  ? Procedure: CARPAL TUNNEL RELEASE;  Surgeon: HowEarnestine LeysD;  Location: [facility] ORS;  Service: Orthopedics;  Laterality: Left;  ? COLONOSCOPY WITH PROPOFOL N/A 10/08/2017  ? Procedure: COLONOSCOPY WITH PROPOFOL;   Surgeon: VanLin LandsmanD;  [facility];  Service: Gastroenterology;  Laterality: N/A;  ? GASTRIC BYPASS  2002  ? KNEE SURGERY    ? knee trauma  ? prostate seeding    ? ?Patient Active Problem List  ? Diagnosis Date Noted  ? Lesion of bone of lumbosacral spine (L5) 05/12/2021  ? Localized osteoarthritis of shoulder regions, bilateral 03/29/2021  ? Chronic pain of both shoulders 03/29/2021  ? Drug-induced myopathy 02/21/2021  ? Trigger finger of right hand 11/15/2020  ? Prostate cancer ([place]CC[place]7/11/2020  ? Insomnia 08/01/2019  ? Primary osteoarthritis of both wrists 02/17/2019  ? Trigger middle finger of left hand 02/17/2019  ? Chronic pain syndrome 02/17/2019  ? Chronic radicular lumbar pain 10/08/2018  ? Lumbar radiculopathy 10/08/2018  ? Lumbar degenerative disc disease 10/08/2018  ? Lumbar facet arthropathy 10/08/2018  ? Lumbar facet joint syndrome 10/08/2018  ? Intervertebral disc disorder with radiculopathy of lumbosacral region   ? Pernicious anemia 05/22/2016  ? Paroxysmal atrial fibrillation ([place]CC[place]2/04/2015  ? DJD (degenerative joint disease) of knee 10/27/2014  ? Bilateral carpal tunnel syndrome 10/27/2014  ? Morbid obesity with BMI of 50.0-59.9, adult (HCCPierrepont Manor0/11/2014  ? Mixed hyperlipidemia 10/27/2014  ? H/O gastric bypass 03/20/2014  ? Hyperkalemia 03/20/2014  ? History of prostate cancer 12/26/2013  ? Essential hypertension  12/26/2013  ? Encounter for anticoagulation discussion and counseling 12/26/2013  ? ? ?PCP: Olin Hauser, DO ? ?REFERRING PROVIDER: Gillis Santa, MD ? ?REFERRING DIAGNOSIS: M19.011,M19.012 (ICD-10-CM) - Localized osteoarthritis of shoulder regions, bilateral, M65.30 (ICD-10-CM) - Trigger finger of right hand, unspecified finger, M25.511,G89.29,M25.512 (ICD-10-CM) - Chronic pain of both shoulders, M53.3 (ICD-10-CM) - Sacroiliac joint pain, G89.4 (ICD-10-CM) - Chronic pain syndrome ? ?THERAPY DIAG: Other low back pain ? ?Chronic left shoulder  pain ? ?Chronic right shoulder pain ? ?Muscle weakness (generalized) ? ?ONSET DATE: 04/30/19 ? ?FOLLOW UP APPT WITH PROVIDER: Yes  ? ? ?From initial evaluation (04/29/21) ? ?SUBJECTIVE:                                                                                                                                                                                        ? ?Chief Complaint: Bilateral shoulder pain ? ?Pertinent History ?[name] is a 66 y.o. year old, male patient referred to physical therapy by pain management for bilateral shoulder pain. Pt reports chronic bilateral shoulder pain for approximately the last 2 years. The pain started in his R wrist which improved after an injection however the pain gradually started worsening and then moving up his R arm to his R shoulder. Eventually he started having pain in his L shoulder as well. He noticed the most significant pain when lifting his arms to wash under them while bathing. He had injections in both shoulders on March 27th with significant improvement in pain since that time. At this point pt states that he is not having any shoulder pain. He has a past medical history of Cardiomyopathy (in setting of Afib), DJD (degenerative joint disease) of knee, History of kidney stones, Intervertebral disc disorder with radiculopathy of lumbosacral region, Kidney stone (2012), Lower extremity edema, PAF (paroxysmal atrial fibrillation) ([place]), Pernicious anemia, Prostate cancer ([place]), Pulmonary nodule, right, and SVT (supraventricular tachycardia) ([place]). He also  has a past surgical history that includes Knee surgery; Gastric bypass (2002); prostate seeding; Carpal tunnel release (Right, 12/23/2014); Carpal tunnel release (Left, 01/06/2015); Colonoscopy with propofol (N/A, 10/08/2017); and Cardioversion (N/A, 05/15/2019). He has tapered off a lot of his medications over the last few months because he felt like a lot of his symptoms were coming from medication side  effects. He has also noted that he started having anxiety attacks since January which is new for him. Pt and his wife joined [employer] and he recently ordered an upright walker with forearm supports to help his mobility. Pt reports losing 41# in the last 2 years secondary to drop in his calorie intake.  ? ? ?Pain:  ?  Pain Intensity: Present: 0/10, Best: 0/10, Worst: 0/10 (not currently hurting) ?Pain location: Bilateral shoulders ?Pain Quality: sharp and aching  ?Radiating: No  ?Numbness/Tingling: Yes, bilateral hand numbness occasionally as well as bilateral foot numbness.  ?Focal Weakness: No, however pt has noticed weakness in both shoulders and hands. ?Aggravating factors: Lifting arms; ?Relieving factors: injections, pain medication, no benefit with heat/topical creams, unable to take NSAIDs secondary to taking Xarelto ?24-hour pain behavior: Constant pain all the time (not currently); ?History of prior shoulder or neck injury, pain, surgery, or therapy: No ?Falls: Has patient fallen in last 6 months? No,  ?Dominant hand: right ?Imaging: Yes, R shoulder radiographs (03/29/21): Mild glenohumeral and mild acromioclavicular osteoarthritis; L shoulder radiographs (03/29/21):  Mild-to-moderate glenohumeral and mild acromioclavicular osteoarthritis. 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 Otherwise negative ? ?Precautions: None ? ?Weight Bearing Restrictions: No ? ?Living Environment ?Lives with: lives with their spouse ?Lives in: House/apartment ? ?Patient Goals: Improve shoulder strength and prevent pain from  returning. Pt would also like to address his back pain/sciatica; ? ? ?OBJECTIVE:  ? ?Patient Surveys  ?FOTO: 50, predicted improvement to 62 ? ?Cognition ?Patient is oriented to person, place, and time.  ?Recent memor

## 2021-05-30 NOTE — Telephone Encounter (Signed)
Please review

## 2021-05-30 NOTE — Telephone Encounter (Signed)
Prescription refill request for Xarelto received.  ?Indication: PAF ?Last office visit: 03/14/21  Johnny Bridge MD ?Weight: 155.4kg ?Age: 66 ?Scr: 1.21 on 07/22/20 ?CrCl: 130.21 ? ?Based on above findings Xarelto '20mg'$  daily is the appropriate dose.  Refill approved. ? ?

## 2021-05-31 NOTE — Therapy (Signed)
?OUTPATIENT PHYSICAL THERAPY SHOULDER/BACK TREATMENT ? ? ?Patient Name: [name] ?MRN: [id number] ?DOB:Dec 16, 1955, 66 y.o., male ?Today's Date: 06/01/2021 ? ? PT End of Session - 06/01/21 1356   ? ? Visit Number 11   ? Number of Visits 25   ? Date for PT Re-Evaluation 07/22/21   ? Authorization Type eval: 04/29/21   ? PT Start Time 1355   ? PT Stop Time 1440   ? PT Time Calculation (min) 45 min   ? Activity Tolerance Patient tolerated treatment well   ? Behavior During Therapy [facility] for tasks assessed/performed   ? ?  ?  ? ?  ? ? ?Past Medical History:  ?Diagnosis Date  ? Cardiomyopathy (in setting of Afib)   ? a. 12/2013 Echo: EF 45-50%, mild ant and antsept HK. mild MR. Mod dil LA. nl RV fxn. Rhythm was Afib.  ? DJD (degenerative joint disease) of knee   ? History of kidney stones   ? Intervertebral disc disorder with radiculopathy of lumbosacral region   ? Kidney stone 2012  ? Lower extremity edema   ? PAF (paroxysmal atrial fibrillation) ([place])   ? Pernicious anemia   ? Prostate cancer ([place])   ? Pulmonary nodule, right   ? a. 10/2014 Cardiac CTA: 30m RLL nodule; b. 04/2015 CT Chest: stable 7454mRLL nodule. No new nodules; 02/2017 CTA Chest: stable, benign, 54m654mLL pulm nodule.  ? SVT (supra[place]ricular tachycardia) ([place] ? ?Past Surgical History:  ?Procedure Laterality Date  ? CARDIOVERSION N/A 05/15/2019  ? Procedure: CARDIOVERSION;  Surgeon: GolMinna MerrittsD;  Location: [facility] ORS;  Service: Cardiovascular;  Laterality: N/A;  ? CARPAL TUNNEL RELEASE Right 12/23/2014  ? Procedure: CARPAL TUNNEL RELEASE;  Surgeon: HowEarnestine LeysD;  Location: [facility] ORS;  Service: Orthopedics;  Laterality: Right;  ? CARPAL TUNNEL RELEASE Left 01/06/2015  ? Procedure: CARPAL TUNNEL RELEASE;  Surgeon: HowEarnestine LeysD;  Location: [facility] ORS;  Service: Orthopedics;  Laterality: Left;  ? COLONOSCOPY WITH PROPOFOL N/A 10/08/2017  ? Procedure: COLONOSCOPY WITH PROPOFOL;  Surgeon: VanLin LandsmanD;  [facility];  Service:  Gastroenterology;  Laterality: N/A;  ? GASTRIC BYPASS  2002  ? KNEE SURGERY    ? knee trauma  ? prostate seeding    ? ?Patient Active Problem List  ? Diagnosis Date Noted  ? Lesion of bone of lumbosacral spine (L5) 05/12/2021  ? Localized osteoarthritis of shoulder regions, bilateral 03/29/2021  ? Chronic pain of both shoulders 03/29/2021  ? Drug-induced myopathy 02/21/2021  ? Trigger finger of right hand 11/15/2020  ? Prostate cancer ([place]7/11/2020  ? Insomnia 08/01/2019  ? Primary osteoarthritis of both wrists 02/17/2019  ? Trigger middle finger of left hand 02/17/2019  ? Chronic pain syndrome 02/17/2019  ? Chronic radicular lumbar pain 10/08/2018  ? Lumbar radiculopathy 10/08/2018  ? Lumbar degenerative disc disease 10/08/2018  ? Lumbar facet arthropathy 10/08/2018  ? Lumbar facet joint syndrome 10/08/2018  ? Intervertebral disc disorder with radiculopathy of lumbosacral region   ? Pernicious anemia 05/22/2016  ? Paroxysmal atrial fibrillation ([place]CC[place]2/04/2015  ? DJD (degenerative joint disease) of knee 10/27/2014  ? Bilateral carpal tunnel syndrome 10/27/2014  ? Morbid obesity with BMI of 50.0-59.9, adult ([place]CC[place]0/11/2014  ? Mixed hyperlipidemia 10/27/2014  ? H/O gastric bypass 03/20/2014  ? Hyperkalemia 03/20/2014  ? History of prostate cancer 12/26/2013  ? Essential hypertension 12/26/2013  ? Encounter for anticoagulation discussion and counseling 12/26/2013  ? ? ?PCP: KarOlin Hauser  DO ? ?REFERRING PROVIDER: Gillis Santa, MD ? ?REFERRING DIAGNOSIS: M19.011,M19.012 (ICD-10-CM) - Localized osteoarthritis of shoulder regions, bilateral, M65.30 (ICD-10-CM) - Trigger finger of right hand, unspecified finger, M25.511,G89.29,M25.512 (ICD-10-CM) - Chronic pain of both shoulders, M53.3 (ICD-10-CM) - Sacroiliac joint pain, G89.4 (ICD-10-CM) - Chronic pain syndrome ? ?THERAPY DIAG: Other low back pain ? ?Chronic left shoulder pain ? ?Chronic right shoulder pain ? ?Muscle weakness (generalized) ? ?ONSET  DATE: 04/30/19 ? ?FOLLOW UP APPT WITH PROVIDER: Yes  ? ? ?From initial evaluation (04/29/21) ? ?SUBJECTIVE:                                                                                                                                                                                        ? ?Chief Complaint: Bilateral shoulder pain ? ?Pertinent History ?[name] is a 66 y.o. year old, male patient referred to physical therapy by pain management for bilateral shoulder pain. Pt reports chronic bilateral shoulder pain for approximately the last 2 years. The pain started in his R wrist which improved after an injection however the pain gradually started worsening and then moving up his R arm to his R shoulder. Eventually he started having pain in his L shoulder as well. He noticed the most significant pain when lifting his arms to wash under them while bathing. He had injections in both shoulders on March 27th with significant improvement in pain since that time. At this point pt states that he is not having any shoulder pain. He has a past medical history of Cardiomyopathy (in setting of Afib), DJD (degenerative joint disease) of knee, History of kidney stones, Intervertebral disc disorder with radiculopathy of lumbosacral region, Kidney stone (2012), Lower extremity edema, PAF (paroxysmal atrial fibrillation) ([place]), Pernicious anemia, Prostate cancer ([place]), Pulmonary nodule, right, and SVT (supraventricular tachycardia) ([place]). He also  has a past surgical history that includes Knee surgery; Gastric bypass (2002); prostate seeding; Carpal tunnel release (Right, 12/23/2014); Carpal tunnel release (Left, 01/06/2015); Colonoscopy with propofol (N/A, 10/08/2017); and Cardioversion (N/A, 05/15/2019). He has tapered off a lot of his medications over the last few months because he felt like a lot of his symptoms were coming from medication side effects. He has also noted that he started having anxiety attacks since  January which is new for him. Pt and his wife joined [employer] and he recently ordered an upright walker with forearm supports to help his mobility. Pt reports losing 41# in the last 2 years secondary to drop in his calorie intake.  ? ? ?Pain:  ?Pain Intensity: Present: 0/10, Best: 0/10, Worst: 0/10 (not currently hurting) ?Pain location: Bilateral shoulders ?Pain  Quality: sharp and aching  ?Radiating: No  ?Numbness/Tingling: Yes, bilateral hand numbness occasionally as well as bilateral foot numbness.  ?Focal Weakness: No, however pt has noticed weakness in both shoulders and hands. ?Aggravating factors: Lifting arms; ?Relieving factors: injections, pain medication, no benefit with heat/topical creams, unable to take NSAIDs secondary to taking Xarelto ?24-hour pain behavior: Constant pain all the time (not currently); ?History of prior shoulder or neck injury, pain, surgery, or therapy: No ?Falls: Has patient fallen in last 6 months? No,  ?Dominant hand: right ?Imaging: Yes, R shoulder radiographs (03/29/21): Mild glenohumeral and mild acromioclavicular osteoarthritis; L shoulder radiographs (03/29/21):  Mild-to-moderate glenohumeral and mild acromioclavicular osteoarthritis. 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 Otherwise negative ? ?Precautions: None ? ?Weight Bearing Restrictions: No ? ?Living Environment ?Lives with: lives with their spouse ?Lives in: House/apartment ? ?Patient Goals: Improve shoulder strength and prevent pain from returning. Pt would also like to address his back pain/sciatica; ? ? ?OBJECTIVE:   ? ?Patient Surveys  ?FOTO: 50, predicted improvement to 62 ? ?Cognition ?Patient is oriented to person, place, and time.  ?Recent memory is intact.  ?Remote memory is intact.  ?Attention span and concentrati

## 2021-06-01 ENCOUNTER — Ambulatory Visit: Payer: Medicare Other

## 2021-06-01 DIAGNOSIS — M6281 Muscle weakness (generalized): Secondary | ICD-10-CM | POA: Diagnosis not present

## 2021-06-01 DIAGNOSIS — M25512 Pain in left shoulder: Secondary | ICD-10-CM | POA: Diagnosis not present

## 2021-06-01 DIAGNOSIS — G8929 Other chronic pain: Secondary | ICD-10-CM | POA: Diagnosis not present

## 2021-06-01 DIAGNOSIS — M5459 Other low back pain: Secondary | ICD-10-CM | POA: Diagnosis not present

## 2021-06-01 DIAGNOSIS — M25511 Pain in right shoulder: Secondary | ICD-10-CM | POA: Diagnosis not present

## 2021-06-06 ENCOUNTER — Ambulatory Visit: Payer: Medicare Other

## 2021-06-06 DIAGNOSIS — M25511 Pain in right shoulder: Secondary | ICD-10-CM | POA: Diagnosis not present

## 2021-06-06 DIAGNOSIS — G8929 Other chronic pain: Secondary | ICD-10-CM | POA: Diagnosis not present

## 2021-06-06 DIAGNOSIS — M6281 Muscle weakness (generalized): Secondary | ICD-10-CM | POA: Diagnosis not present

## 2021-06-06 DIAGNOSIS — M5459 Other low back pain: Secondary | ICD-10-CM

## 2021-06-06 DIAGNOSIS — M25512 Pain in left shoulder: Secondary | ICD-10-CM | POA: Diagnosis not present

## 2021-06-06 NOTE — Therapy (Unsigned)
OUTPATIENT PHYSICAL THERAPY SHOULDER/BACK TREATMENT   Patient Name: [name] MRN: [id number] DOB:29-May-1955, 66 y.o., male Today's Date: 06/07/2021   PT End of Session - 06/06/21 1403     Visit Number 12    Number of Visits 25    Date for PT Re-Evaluation 07/22/21    Authorization Type eval: 04/29/21    PT Start Time 1400    PT Stop Time 1445    PT Time Calculation (min) 45 min    Activity Tolerance Patient tolerated treatment well    Behavior During Therapy [facility] for tasks assessed/performed              Past Medical History:  Diagnosis Date   Cardiomyopathy (in setting of Afib)    a. 12/2013 Echo: EF 45-50%, mild ant and antsept HK. mild MR. Mod dil LA. nl RV fxn. Rhythm was Afib.   DJD (degenerative joint disease) of knee    History of kidney stones    Intervertebral disc disorder with radiculopathy of lumbosacral region    Kidney stone 2012   Lower extremity edema    PAF (paroxysmal atrial fibrillation) (HCC)    Pernicious anemia    Prostate cancer ([place])    Pulmonary nodule, right    a. 10/2014 Cardiac CTA: 24m RLL nodule; b. 04/2015 CT Chest: stable 744mRLL nodule. No new nodules; 02/2017 CTA Chest: stable, benign, 71m10mLL pulm nodule.   [facility]  Past Surgical History:  Procedure Laterality Date   CARDIOVERSION N/A 05/15/2019   Procedure: CARDIOVERSION;  Surgeon: GolMinna MerrittsD;  Location: [facility] ORS;  Service: Cardiovascular;  Laterality: N/A;   CARPAL TUNNEL RELEASE Right 12/23/2014   Procedure: CARPAL TUNNEL RELEASE;  Surgeon: HowEarnestine LeysD;  Location: [facility] ORS;  Service: Orthopedics;  Laterality: Right;   CARPAL TUNNEL RELEASE Left 01/06/2015   Procedure: CARPAL TUNNEL RELEASE;  Surgeon: HowEarnestine LeysD;  Location: [facility] ORS;  Service: Orthopedics;  Laterality: Left;   COLONOSCOPY WITH PROPOFOL N/A 10/08/2017   Procedure: COLONOSCOPY WITH PROPOFOL;  Surgeon: VanLin LandsmanD;  [facility];   Service: Gastroenterology;  Laterality: N/A;   GASTRIC BYPASS  2002   KNEE SURGERY     knee trauma   prostate seeding     Patient Active Problem List   Diagnosis Date Noted   Lesion of bone of lumbosacral spine (L5) 05/12/2021   Localized osteoarthritis of shoulder regions, bilateral 03/29/2021   Chronic pain of both shoulders 03/29/2021   Drug-induced myopathy 02/21/2021   Trigger finger of right hand 11/15/2020   Prostate cancer ([facility]CC[place]7/11/2020   Insomnia 08/01/2019   Primary osteoarthritis of both wrists 02/17/2019   Trigger middle finger of left hand 02/17/2019   Chronic pain syndrome 02/17/2019   Chronic radicular lumbar pain 10/08/2018   Lumbar radiculopathy 10/08/2018   Lumbar degenerative disc disease 10/08/2018   Lumbar facet arthropathy 10/08/2018   Lumbar facet joint syndrome 10/08/2018   Intervertebral disc disorder with radiculopathy of lumbosacral region    Pernicious anemia 05/22/2016   Paroxysmal atrial fibrillation ([facility] [place]2/04/2015   DJD (degenerative joint disease) of knee 10/27/2014   Bilateral carpal tunnel syndrome 10/27/2014   Morbid obesity with BMI of 50.0-59.9, adult ([facility]CCBlue [facility]0/11/2014   Mixed hyperlipidemia 10/27/2014   H/O gastric bypass 03/20/2014   Hyperkalemia 03/20/2014   History of prostate cancer 12/26/2013   Essential hypertension 12/26/2013   Encounter for anticoagulation discussion and counseling 12/26/2013    PCP: KarNobie Putnam  J, DO  REFERRING PROVIDER: Gillis Santa, MD  REFERRING DIAGNOSIS: M19.011,M19.012 (ICD-10-CM) - Localized osteoarthritis of shoulder regions, bilateral, M65.30 (ICD-10-CM) - Trigger finger of right hand, unspecified finger, M25.511,G89.29,M25.512 (ICD-10-CM) - Chronic pain of both shoulders, M53.3 (ICD-10-CM) - Sacroiliac joint pain, G89.4 (ICD-10-CM) - Chronic pain syndrome  THERAPY DIAG: Other low back pain  Chronic left shoulder pain  Chronic right shoulder pain  Muscle weakness  (generalized)  ONSET DATE: 04/30/19  FOLLOW UP APPT WITH PROVIDER: Yes    From initial evaluation (04/29/21)  SUBJECTIVE:                                                                                                                                                                                         Chief Complaint: Bilateral shoulder pain  Pertinent History [name] is a 66 y.o. year old, male patient referred to physical therapy by pain management for bilateral shoulder pain. Pt reports chronic bilateral shoulder pain for approximately the last 2 years. The pain started in his R wrist which improved after an injection however the pain gradually started worsening and then moving up his R arm to his R shoulder. Eventually he started having pain in his L shoulder as well. He noticed the most significant pain when lifting his arms to wash under them while bathing. He had injections in both shoulders on March 27th with significant improvement in pain since that time. At this point pt states that he is not having any shoulder pain. He has a past medical history of Cardiomyopathy (in setting of Afib), DJD (degenerative joint disease) of knee, History of kidney stones, Intervertebral disc disorder with radiculopathy of lumbosacral region, Kidney stone (2012), Lower extremity edema, PAF (paroxysmal atrial fibrillation) ([place]), Pernicious anemia, Prostate cancer ([place]), Pulmonary nodule, right, and SVT (supraventricular tachycardia) ([place]). He also  has a past surgical history that includes Knee surgery; Gastric bypass (2002); prostate seeding; Carpal tunnel release (Right, 12/23/2014); Carpal tunnel release (Left, 01/06/2015); Colonoscopy with propofol (N/A, 10/08/2017); and Cardioversion (N/A, 05/15/2019). He has tapered off a lot of his medications over the last few months because he felt like a lot of his symptoms were coming from medication side effects. He has also noted that he started having  anxiety attacks since January which is new for him. Pt and his wife joined [employer] and he recently ordered an upright walker with forearm supports to help his mobility. Pt reports losing 41# in the last 2 years secondary to drop in his calorie intake.    Pain:  Pain Intensity: Present: 0/10, Best: 0/10, Worst: 0/10 (not currently hurting) Pain location: Bilateral shoulders  Pain Quality: sharp and aching  Radiating: No  Numbness/Tingling: Yes, bilateral hand numbness occasionally as well as bilateral foot numbness.  Focal Weakness: No, however pt has noticed weakness in both shoulders and hands. Aggravating factors: Lifting arms; Relieving factors: injections, pain medication, no benefit with heat/topical creams, unable to take NSAIDs secondary to taking Xarelto 24-hour pain behavior: Constant pain all the time (not currently); History of prior shoulder or neck injury, pain, surgery, or therapy: No Falls: Has patient fallen in last 6 months? No,  Dominant hand: right Imaging: Yes, R shoulder radiographs (03/29/21): Mild glenohumeral and mild acromioclavicular osteoarthritis; L shoulder radiographs (03/29/21):  Mild-to-moderate glenohumeral and mild acromioclavicular osteoarthritis. 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 Otherwise negative  Precautions: None  Weight Bearing Restrictions: No  Living Environment Lives with: lives with their spouse Lives in: House/apartment  Patient Goals: Improve shoulder strength and prevent pain from returning. Pt would also like to address his back  pain/sciatica;   OBJECTIVE:   Patient Surveys  FOTO: 95, predicted improvement to 66  Cognition Patient is oriented to person, place, and time.  Recent memory is intact.  Remote memory is intact.  Attention span and concentration are intact.  Expressive speech is intact.  Patient's fund of knowledge is within normal limits for educational level.   Gross Musculoskeletal Assessment Tremor: None Bulk: Normal Tone: Normal  Gait Deferred  Posture Forward head and rounded shoulders in both sitting and standing;  Cervical Screen AROM: WFL and painless with overpressure in all planes Spurlings A (ipsilateral lateral flexion/axial compression): R: Negative L: Negative Spurlings B (ipsilateral lateral flexion/contralateral rotation/axial compression): R: Negative L: Negative Repeated movement: Not tested Hoffman Sign (cervical cord compression): R: Positive L: Negative ULTT Median: R: Not examined L: Not examined ULTT Ulnar: R: Not examined L: Not examined ULTT Radial: R: Not examined L: Not examined   AROM  AROM (Normal range in degrees) AROM 06/07/2021  Cervical  Flexion (50) WNL  Extension (80) WNL  Right lateral flexion (45) WNL  Left lateral flexion (45) WNL  Right rotation (85) WNL  Left rotation (85) WNL     Right Left  Shoulder    Flexion 177 170  Extension    Abduction 155 146  External Rotation 90 90  Internal Rotation 70 70    Elbow    Flexion WNL WNL  Extension WNL WNL  Pronation WNL WNL  Supination WNL WNL  (* = pain; Blank rows = not tested)    LE MMT:  MMT (out of 5) Right 06/07/2021 Left 06/07/2021  Cervical (isometric)  Flexion   Extension   Lateral Flexion    Rotation        Shoulder   Flexion 4+ 5  Extension    Abduction 5 5*  External rotation (seated) 5 5  Internal rotation (seated) 5 5  Horizontal abduction    Horizontal adduction    Lower Trapezius    Rhomboids        Elbow  Flexion 5 5  Extension 5 5  Pronation 5 5   Supination 5 5      Wrist  Flexion 5 5  Extension 5 5  Radial deviation 5 5  Ulnar deviation  5 5      MCP  Flexion    Extension    Abduction 5 5  Adduction 5 5  (* = pain; Blank rows = not tested)  Sensation Deferred  Reflexes Deferred   Palpation No pain with palpation to anterior, lateral, and posterior shoulders bilaterally;   Passive Accessory Intervertebral Motion Deferred    Accessory Motions/Glides Glenohumeral: Posterior: R: abnormal (hypomobile) L: abnormal (hypomobile) Inferior: R: not examined L: not examined Anterior: R: not examined L: not examined  Acromioclavicular:  Posterior: R: not examined L: not examined Anterior: R: not examined L: not examined  Sternoclavicular: Posterior: R: not examined L: not examined Anterior: R: not examined L: not examined Superior: R: not examined L: not examined Inferior: R: not examined L: not examined  Scapulothoracic: Distraction: R: not examined L: not examined Medial: R: not examined L: not examined Lateral: R: not examined L: not examined Inferior: R: not examined L: not examined Superior: R: not examined L: not examined  Muscle Length Testing Deferred    SPECIAL TESTS  Rotator Cuff  Drop Arm Test: Negative Painful Arc (Pain from 60 to 120 degrees scaption): Negative Infraspinatus Muscle Test: Negative  Subacromial Impingement Hawkins-Kennedy: R: Negative L: Negative Neer (Block scapula, PROM flexion): R: Negative L: Negative Painful Arc (Pain from 60 to 120 degrees scaption): R: Negative L: Negative Empty Can: R: Not tested L: Not tested External Rotation Resistance: R: Negative L: Negative Horizontal Adduction: Not examined Scapular Assist: R: Not tested L: Not tested  Labral Tear Biceps Load II (120 elevation, full ER, 90 elbow flexion, full supination, resisted elbow flexion): R: Negative L: Negative Crank (160 scaption, axial load with IR/ER): R: Positive L: Negative Active  Compression Test: R: Not tested L: Not tested  Bicep Tendon Pathology Speed (shoulder flexion to 90, external rotation, full elbow extension, and forearm supination with resistance: Not examined Yergason's (resisted shoulder ER and supination/biceps tendon pathology): R: Positive L: Positive  Shoulder Instability Sulcus Sign: R: Not tested L: Not tested Anterior Apprehension: R: Negative L: Negative  Beighton scale: Deferred   Back Assessment 05/04/21  Pertinent Back Pain History [name] is a 66 y.o. year old, male patient referred to physical therapy by pain management for bilateral shoulder pain. He also reports a history of chronic low back pain. He has had intermittent occasional acute flares of back pain for decades. However pain worsened around 2017 when he noticed LLE sciatica and started having LLE weakness/buckling. He saw a chiropractor in [place] who thought it was better to send him for [facility] which significantly helped the pain. He traveled to [place] and while he was there his pain worsened. When he returned he had another ESI which improved his pain however it eventually returned.  For the last two years that he was working he could hardly walk due to the pain. He saw Dr. Sabra Heck (ortho) who referred him to Dr. Holley Raring with pain management. Pt has had lumbar injections and the pain improves with injections however it return after about a month. He had a radiofrequency ablation for his L lumbar spine in early February and R side of his lumbar spine in late February. Once he had the procedure on the R side he had complete resolution of his pain however it has gradually returned on the L side. Pain radiates into his L buttock down to the L knee. He is currently on a course prednisone.   Pain:  Pain Intensity: Present: 2/10, Best:  0/10, Worst: 10/10 Pain location: Central low back spreading out to the left side; Pain Quality: aching, deep Radiating: Yes, into the L buttock and  down the L posterior thigh to the knee  Numbness/Tingling: Yes, numbness/tingling into L calf and in L foot Focal Weakness: Yes, occasional feeling of bilateral LE weakness but not as bad as it had been previously Aggravating factors: walking, sometimes starts spontaneously, extended sitting in the evening,  Relieving factors: pain medications (takes infrequently), sometimes laying down helps, heat (pt unsure if its the heat or laying down that actually helps) 24-hour pain behavior: Better in the AM, worse as the day progresses; Imaging: Lumbar radiograph 12/08/20: FINDINGS: 13 mm anterolisthesis L5 on S1. 5 mm retrolisthesis L4 on L5. 7 mm retrolisthesis L2 on L3. Vertebral body heights are maintained. There is mild scoliosis of the lumbar spine. Multilevel degenerative change with marked disc space narrowing and osteophyte at L4-L5, mild degenerative changes at T12-L1, L1-L2 and L2-L3. Facet degenerative changes at multiple levels. Aortic atherosclerosis. With flexion, grossly stable 13 mm anterolisthesis L5 on S1 and retrolisthesis L4 on L5. Retrolisthesis at L2-L3 slightly diminishes to 4 mm. With extension, similar 13 mm anterolisthesis L5 on S1 and similar retrolisthesis L4 on L5. 8 mm retrolisthesis L2 on L3.  Lumbar MRI 09/12/18: (see entire report however significant findings below): Baseline narrowing of the vertebral canal due to congenitally short pedicles, degenerative changes thorughout the lumbar spine including moderate to severe left neuroforaminal narrowing at L4-5, bilateral mildly diastatic pars defects trace anterolisthesis L5 on S1, mild R facet edema at L4-5 and mild periarticular soft tissue edema at L4-5 bilaterally, mild R facet edema at L5-S1, sclerotic lesion in the L2 vertebral body and the left aspect of the S2 body. Given the patient's history of prostate cancer, sequela of prostate metastasis cannot be excluded.  History of prior back injury, pain, surgery, or therapy: Yes,  Nicole Kindred PT years ago for back pain, has had chiropractic care as well  Red flags: History of prostate cancer, progressive weight loss over the last couple years. Denies bowel/bladder changes, saddle paresthesia, h/o spinal tumors, h/o compression fx, h/o abdominal aneurysm, abdominal pain, chills/fever, night sweats, nausea, vomiting;  Precautions: None  Weight Bearing Restrictions: No   Patient Goals: Decrease back pain and improve his ability to be active and exercise;   OBJECTIVE:   Cognition Patient is oriented to person, place, and time.  Recent memory is intact.  Remote memory is intact.  Attention span and concentration are intact.  Expressive speech is intact.  Patient's fund of knowledge is within normal limits for educational level.     Gross Musculoskeletal Assessment Tremor: None Bulk: Normal Tone: Normal No visible step-off along spinal column;   GAIT: Deferred   Posture: Lumbar lordosis: WNL Iliac crest height: Equal bilaterally Lumbar lateral shift: Negative History of scoliosis reported on imaging but not evident during physical exam today  AROM  AROM (Normal range in degrees) AROM  06/07/2021  Lumbar   Flexion (65) WNL  Extension (30) Mild deficit  Right lateral flexion (25) Mild deficit  Left lateral flexion (25) Mild deficit  Right rotation (30) Mild deficit  Left rotation (30) Mild deficit      Hip Right Left  Flexion (125) WNL WNL  Extension (15) Moderate limitation Moderate limitation  Abduction (40)    Adduction     Internal Rotation (45) >30 degrees >30 degrees  External Rotation (45) >30 degrees >30 degrees  Knee    Flexion (135) WNL WNL  Extension (0) WNL WNL      Ankle    Dorsiflexion (20)    Plantarflexion (50)    Inversion (35)    Eversion (15    (* = pain; Blank rows = not tested)   LE MMT:  MMT (out of 5) Right 06/07/2021 Left 06/07/2021  Hip flexion 5 5  Hip extension    Hip abduction (seated) 5 5  Hip  adduction (seated) 5 5  Hip internal rotation 5 5  Hip external rotation 5 5  Knee flexion 5 5  Knee extension 5 5  Ankle dorsiflexion 5 5  Ankle plantarflexion    Ankle inversion    Ankle eversion    (* = pain; Blank rows = not tested)   Sensation Grossly intact to light touch bilateral LEs as determined by testing dermatomes L2-S2. Proprioception, and hot/cold testing deferred on this date.   Reflexes Deferred   Muscle Length Hamstrings: R: Positive for limitation around 70 degrees L: Positive for limitation around 70 degrees Ely (quadriceps): R: Not examined L: Not examined Thomas (hip flexors): R: Not examined L: Not examined Ober: R: Not examined L: Not examined   Palpation  Location LEFT  RIGHT           Lumbar paraspinals 1 1  Quadratus Lumborum    Gluteus Maximus 0 0  Gluteus Medius 0 0  Deep hip external rotators 1 0  PSIS 0 0  Fortin's Area (SIJ) 0 0  Greater Trochanter 0 0  (Blank rows = not tested) Graded on 0-4 scale (0 = no pain, 1 = pain, 2 = pain with wincing/grimacing/flinching, 3 = pain with withdrawal, 4 = unwilling to allow palpation), (Blank rows = not tested)   Passive Accessory Intervertebral Motion Pt reports mild back pain with CPA L3-L5 and UPA bilaterally L3-L5. Generally, hypomobile throughout    SPECIAL TESTS Lumbar Radiculopathy and Discogenic: Centralization and Peripheralization (SN 92, -LR 0.12): Not examined Slump (SN 83, -LR 0.32): R: Negative L: Negative SLR (SN 92, -LR 0.29): R: Negative L:  Negative Crossed SLR (SP 90): R: Negative L: Negative  Facet Joint: Extension-Rotation (SN 100, -LR 0.0): R: Negative L: Negative  Lumbar Foraminal Stenosis: Lumbar quadrant (SN 70): R: Negative L: Negative  Hip: FABER (SN 81): R: Negative L: Negative FADIR (SN 94): R: Negative L: Negative Hip scour (SN 50): R: Negative L: Negative  SIJ:  Thigh Thrust (SN 88, -LR 0.18) : R: Not examined L: Not examined  Piriformis  Syndrome: FAIR Test (SN 88, SP 83): R: Not examined L: Not examined  Functional Tasks Deferred  Beighton scale: Deferred   TODAY'S TREATMENT  SUBJECTIVE: Pt reports that he is doing alright today. He continues with soreness in bilateral low back which he rates as 2/10. Right shoulder remains painful since recently aggravated. He states that he had some L shoulder pain after a lot of mowing over the weekend.  PAIN: 2/10 low back pain upon arrival;   Ther-ex  NuStep (seat 13, arms 11) L2-3 BUE/BLE x 6 minutes for warm-up during interval history; Seated LAQ with 5# ankle weight (AW) x 11 RLE, x 20 LLE; Seated marches with 5# AW x 25 BLE; Seated shoulder flexion 5# dumbbell (DB) in LUE, 1# DB in RUE x 20 BUE; Seated shoulder scaption 5# DB in LUE x 15, 1# DB in RUE x 20; Seated overhead shoulder press with 5# DB in LUE, 1# DB in  RUE x 20 BUE; Seated bicep curls with 5# DB in LUE, 1# DB in RUE x 20, x 25 BUE; Seated clams with blue tband 2 x 40 BLE; Seated adductor ball squeeze 2 x 30 BLE;  Seated chest press with cane and manual resistance from therapist 2 x 16; Seated rows with cane and manual resistance from therapist 2 x 16; Attempted seated shoulder "Ws" with green tband resistance (discontinued secondary to R shoulder pain) Seated shoulder IR with green tband resistance x 10, x 15 RLE, 2 x 15 LUE;   Not performed today: Seated Nautilus rows 30# x 10, 40# 2 x 10; Seated Nautilus chest press 40# 3 x 10; Standing exercises with 2# ankle weights: Hip flexion marches 2 x 10 BLE; Hip abduction 2 x 10 BLE; Hamstring curls 2 x 10 BLE; Hip extension 2 x 10 BLE; Seated Pallof press with green tband 2 x 10 toward each side; Supine (incline) SLR 2 x 10 RLE, x 12/x 10 LLE; Supine (incline) partial bridges 2 x 12;   PATIENT EDUCATION:  Education details: Pt educated throughout session about proper posture and technique with exercises. Improved exercise technique, movement at target  joints, use of target muscles after min to mod verbal, visual, tactile cues Person educated: Patient Education method: Explanation Education comprehension: verbalized understanding and returned demonstration;   HOME EXERCISE PROGRAM: Access Code: [email or web address] URL: [email or web address] Date: 05/09/2021 Prepared by: Roxana Hires  Exercises - Single Arm Shoulder Flexion with Dumbbell  - 1 x daily - 3 x weekly - 2-3 sets - 10 reps - 2s hold - Single Arm Scaption with Dumbbell  - 1 x daily - 3 x weekly - 2-3 sets - 10 reps - 2s hold - Seated Shoulder Row with Anchored Resistance  - 1 x daily - 3 x weekly - 2-3 sets - 10 reps - 2s hold - Shoulder W - External Rotation with Resistance  - 1 x daily - 3 x weekly - 2-3 sets - 10 reps - 2s hold - Seated March with Resistance  - 1 x daily - 7 x weekly - 2-3 sets - 10 reps - 2s hold - Seated Hip Abduction with Resistance  - 1 x daily - 7 x weekly - 2-3 sets - 10 reps - 2s hold - Seated Hip Adduction Isometrics with Ball  - 1 x daily - 7 x weekly - 2-3 sets - 10 reps - 2s hold   ASSESSMENT:  CLINICAL IMPRESSION:  Continued upper quarter strengthening during session today as well as hip and abdominal exercises. Pt reports appropriate muscle fatigue. Pt was able to increase to 5 pound dumbbell in LUE. He remains limited in RUE secondary to increase in pain with excessive resistance. No HEP updates currently but will consider updating at future sessions as appropriate. Pt encouraged to follow-up with PT as scheduled. He will benefit from PT services to address deficits in strength and pain in order to return to full function at home and with leisure activities.    REHAB POTENTIAL: Good  CLINICAL DECISION MAKING: Stable/uncomplicated  EVALUATION COMPLEXITY: Low   GOALS: Goals reviewed with patient? No  SHORT TERM GOALS: Target date: 07/19/2021  Pt will be independent with HEP to improve strength and decrease shoulder and back pain to  improve pain-free function at home and work. Baseline:  Goal status: ONGOING   LONG TERM GOALS: Target date: 07/22/2021  Pt will increase shoulder FOTO to at least 62 to demonstrate significant improvement in function  at home and work related to shoulder pain  Baseline: 04/29/21: 50, 05/30/21: 57 Goal status: PARTIALLY MET  2.  Pt will report at least 75% improvement in shoulder symptoms with all activities and feel confident with gym-based exercise program in order to maintain pain-free shoulder function. Baseline: 05/30/21: Pt reports minimal shoulder pain since starting therapy until this weekend with new injury Goal status: ACHIEVED  3. Pt will decrease mODI scoreby at least 13 points in order demonstrate clinically significant reduction in pain/disability related to his back pain Baseline: 05/30/21: 72% Goal status: INITIAL  4. Pt will decrease worst back pain as reported on NPRS by at least 2 points in order to demonstrate clinically significant reduction in back pain.  Baseline: 05/30/21: worst: 10/10 Goal status: INITIAL    PLAN: PT FREQUENCY: 2x/week  PT DURATION: 12 weeks  PLANNED INTERVENTIONS: Therapeutic exercises, Therapeutic activity, Neuromuscular re-education, Balance training, Gait training, Patient/Family education, Joint mobilization, Vestibular training, Canalith repositioning, Aquatic Therapy, Dry Needling, Cognitive remediation, Electrical stimulation, Spinal manipulation, Spinal mobilization, Cryotherapy, Moist heat, Traction, Ultrasound, Ionotophoresis 27m/ml Dexamethasone, and Manual therapy  PLAN FOR NEXT SESSION: Continue strengthening for shoulders and back, review and modify HEP as needed,  JLyndel SafeHuprich PT, DPT, GCS  Matin Mattioli 06/07/2021, 5:36 PM

## 2021-06-07 NOTE — Therapy (Signed)
OUTPATIENT PHYSICAL THERAPY SHOULDER/BACK TREATMENT   Patient Name: [name] MRN: [id number] DOB:04/28/1955, 66 y.o., male Today's Date: 06/08/2021   PT End of Session - 06/08/21 1354     Visit Number 13    Number of Visits 25    Date for PT Re-Evaluation 07/22/21    Authorization Type eval: 04/29/21    PT Start Time 6389    PT Stop Time 1443    PT Time Calculation (min) 45 min    Activity Tolerance Patient tolerated treatment well    Behavior During Therapy [facility] for tasks assessed/performed             Past Medical History:  Diagnosis Date   Cardiomyopathy (in setting of Afib)    a. 12/2013 Echo: EF 45-50%, mild ant and antsept HK. mild MR. Mod dil LA. nl RV fxn. Rhythm was Afib.   DJD (degenerative joint disease) of knee    History of kidney stones    Intervertebral disc disorder with radiculopathy of lumbosacral region    Kidney stone 2012   Lower extremity edema    PAF (paroxysmal atrial fibrillation) (HCC)    Pernicious anemia    Prostate cancer ([place])    Pulmonary nodule, right    a. 10/2014 Cardiac CTA: 4m RLL nodule; b. 04/2015 CT Chest: stable 726mRLL nodule. No new nodules; 02/2017 CTA Chest: stable, benign, 85m26mLL pulm nodule.   [facility]  Past Surgical History:  Procedure Laterality Date   CARDIOVERSION N/A 05/15/2019   Procedure: CARDIOVERSION;  Surgeon: GolMinna MerrittsD;  Location: [facility] ORS;  Service: Cardiovascular;  Laterality: N/A;   CARPAL TUNNEL RELEASE Right 12/23/2014   Procedure: CARPAL TUNNEL RELEASE;  Surgeon: HowEarnestine LeysD;  Location: [facility] ORS;  Service: Orthopedics;  Laterality: Right;   CARPAL TUNNEL RELEASE Left 01/06/2015   Procedure: CARPAL TUNNEL RELEASE;  Surgeon: HowEarnestine LeysD;  Location: [facility] ORS;  Service: Orthopedics;  Laterality: Left;   COLONOSCOPY WITH PROPOFOL N/A 10/08/2017   Procedure: COLONOSCOPY WITH PROPOFOL;  Surgeon: VanLin LandsmanD;  [facility];  Service:  Gastroenterology;  Laterality: N/A;   GASTRIC BYPASS  2002   KNEE SURGERY     knee trauma   prostate seeding     Patient Active Problem List   Diagnosis Date Noted   Lesion of bone of lumbosacral spine (L5) 05/12/2021   Localized osteoarthritis of shoulder regions, bilateral 03/29/2021   Chronic pain of both shoulders 03/29/2021   Drug-induced myopathy 02/21/2021   Trigger finger of right hand 11/15/2020   Prostate cancer ([place]7/11/2020   Insomnia 08/01/2019   Primary osteoarthritis of both wrists 02/17/2019   Trigger middle finger of left hand 02/17/2019   Chronic pain syndrome 02/17/2019   Chronic radicular lumbar pain 10/08/2018   Lumbar radiculopathy 10/08/2018   Lumbar degenerative disc disease 10/08/2018   Lumbar facet arthropathy 10/08/2018   Lumbar facet joint syndrome 10/08/2018   Intervertebral disc disorder with radiculopathy of lumbosacral region    Pernicious anemia 05/22/2016   Paroxysmal atrial fibrillation ([place]CC[place]2/04/2015   DJD (degenerative joint disease) of knee 10/27/2014   Bilateral carpal tunnel syndrome 10/27/2014   Morbid obesity with BMI of 50.0-59.9, adult ([place]CC[place]0/11/2014   Mixed hyperlipidemia 10/27/2014   H/O gastric bypass 03/20/2014   Hyperkalemia 03/20/2014   History of prostate cancer 12/26/2013   Essential hypertension 12/26/2013   Encounter for anticoagulation discussion and counseling 12/26/2013    PCP: KarOlin Hauser  DO  REFERRING PROVIDER: Nobie Putnam *  REFERRING DIAGNOSIS: M19.011,M19.012 (ICD-10-CM) - Localized osteoarthritis of shoulder regions, bilateral, M65.30 (ICD-10-CM) - Trigger finger of right hand, unspecified finger, M25.511,G89.29,M25.512 (ICD-10-CM) - Chronic pain of both shoulders, M53.3 (ICD-10-CM) - Sacroiliac joint pain, G89.4 (ICD-10-CM) - Chronic pain syndrome  THERAPY DIAG: Other low back pain  Chronic left shoulder pain  Chronic right shoulder pain  Muscle weakness  (generalized)  ONSET DATE: 04/30/19  FOLLOW UP APPT WITH PROVIDER: Yes    From initial evaluation (04/29/21)  SUBJECTIVE:                                                                                                                                                                                         Chief Complaint: Bilateral shoulder pain  Pertinent History [name] is a 66 y.o. year old, male patient referred to physical therapy by pain management for bilateral shoulder pain. Pt reports chronic bilateral shoulder pain for approximately the last 2 years. The pain started in his R wrist which improved after an injection however the pain gradually started worsening and then moving up his R arm to his R shoulder. Eventually he started having pain in his L shoulder as well. He noticed the most significant pain when lifting his arms to wash under them while bathing. He had injections in both shoulders on March 27th with significant improvement in pain since that time. At this point pt states that he is not having any shoulder pain. He has a past medical history of Cardiomyopathy (in setting of Afib), DJD (degenerative joint disease) of knee, History of kidney stones, Intervertebral disc disorder with radiculopathy of lumbosacral region, Kidney stone (2012), Lower extremity edema, PAF (paroxysmal atrial fibrillation) ([place]), Pernicious anemia, Prostate cancer ([place]), Pulmonary nodule, right, and SVT (supraventricular tachycardia) ([place]). He also  has a past surgical history that includes Knee surgery; Gastric bypass (2002); prostate seeding; Carpal tunnel release (Right, 12/23/2014); Carpal tunnel release (Left, 01/06/2015); Colonoscopy with propofol (N/A, 10/08/2017); and Cardioversion (N/A, 05/15/2019). He has tapered off a lot of his medications over the last few months because he felt like a lot of his symptoms were coming from medication side effects. He has also noted that he started having  anxiety attacks since January which is new for him. Pt and his wife joined [employer] and he recently ordered an upright walker with forearm supports to help his mobility. Pt reports losing 41# in the last 2 years secondary to drop in his calorie intake.    Pain:  Pain Intensity: Present: 0/10, Best: 0/10, Worst: 0/10 (not currently hurting) Pain location: Bilateral shoulders Pain  Quality: sharp and aching  Radiating: No  Numbness/Tingling: Yes, bilateral hand numbness occasionally as well as bilateral foot numbness.  Focal Weakness: No, however pt has noticed weakness in both shoulders and hands. Aggravating factors: Lifting arms; Relieving factors: injections, pain medication, no benefit with heat/topical creams, unable to take NSAIDs secondary to taking Xarelto 24-hour pain behavior: Constant pain all the time (not currently); History of prior shoulder or neck injury, pain, surgery, or therapy: No Falls: Has patient fallen in last 6 months? No,  Dominant hand: right Imaging: Yes, R shoulder radiographs (03/29/21): Mild glenohumeral and mild acromioclavicular osteoarthritis; L shoulder radiographs (03/29/21):  Mild-to-moderate glenohumeral and mild acromioclavicular osteoarthritis. 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 Otherwise negative  Precautions: None  Weight Bearing Restrictions: No  Living Environment Lives with: lives with their spouse Lives in: House/apartment  Patient Goals: Improve shoulder strength and prevent pain from returning. Pt would also like to address his back  pain/sciatica;   OBJECTIVE:   Patient Surveys  FOTO: 31, predicted improvement to 63  Cognition Patient is oriented to person, place, and time.  Recent memory is intact.  Remote memory is intact.  Attention span and concentration are intact.  Expressive speech is intact.  Patient's fund of knowledge is within normal limits for educational level.   Gross Musculoskeletal Assessment Tremor: None Bulk: Normal Tone: Normal  Gait Deferred  Posture Forward head and rounded shoulders in both sitting and standing;  Cervical Screen AROM: WFL and painless with overpressure in all planes Spurlings A (ipsilateral lateral flexion/axial compression): R: Negative L: Negative Spurlings B (ipsilateral lateral flexion/contralateral rotation/axial compression): R: Negative L: Negative Repeated movement: Not tested Hoffman Sign (cervical cord compression): R: Positive L: Negative ULTT Median: R: Not examined L: Not examined ULTT Ulnar: R: Not examined L: Not examined ULTT Radial: R: Not examined L: Not examined   AROM  AROM (Normal range in degrees) AROM 06/08/2021  Cervical  Flexion (50) WNL  Extension (80) WNL  Right lateral flexion (45) WNL  Left lateral flexion (45) WNL  Right rotation (85) WNL  Left rotation (85) WNL     Right Left  Shoulder    Flexion 177 170  Extension    Abduction 155 146  External Rotation 90 90  Internal Rotation 70 70    Elbow    Flexion WNL WNL  Extension WNL WNL  Pronation WNL WNL  Supination WNL WNL  (* = pain; Blank rows = not tested)    LE MMT:  MMT (out of 5) Right 06/08/2021 Left 06/08/2021  Cervical (isometric)  Flexion   Extension   Lateral Flexion    Rotation        Shoulder   Flexion 4+ 5  Extension    Abduction 5 5*  External rotation (seated) 5 5  Internal rotation (seated) 5 5  Horizontal abduction    Horizontal adduction    Lower Trapezius    Rhomboids        Elbow  Flexion 5 5  Extension 5 5  Pronation 5 5   Supination 5 5      Wrist  Flexion 5 5  Extension 5 5  Radial deviation 5 5  Ulnar deviation 5  5      MCP  Flexion    Extension    Abduction 5 5  Adduction 5 5  (* = pain; Blank rows = not tested)  Sensation Deferred  Reflexes Deferred   Palpation No pain with palpation to anterior, lateral, and posterior shoulders bilaterally;   Passive Accessory Intervertebral Motion Deferred    Accessory Motions/Glides Glenohumeral: Posterior: R: abnormal (hypomobile) L: abnormal (hypomobile) Inferior: R: not examined L: not examined Anterior: R: not examined L: not examined  Acromioclavicular:  Posterior: R: not examined L: not examined Anterior: R: not examined L: not examined  Sternoclavicular: Posterior: R: not examined L: not examined Anterior: R: not examined L: not examined Superior: R: not examined L: not examined Inferior: R: not examined L: not examined  Scapulothoracic: Distraction: R: not examined L: not examined Medial: R: not examined L: not examined Lateral: R: not examined L: not examined Inferior: R: not examined L: not examined Superior: R: not examined L: not examined  Muscle Length Testing Deferred    SPECIAL TESTS  Rotator Cuff  Drop Arm Test: Negative Painful Arc (Pain from 60 to 120 degrees scaption): Negative Infraspinatus Muscle Test: Negative  Subacromial Impingement Hawkins-Kennedy: R: Negative L: Negative Neer (Block scapula, PROM flexion): R: Negative L: Negative Painful Arc (Pain from 60 to 120 degrees scaption): R: Negative L: Negative Empty Can: R: Not tested L: Not tested External Rotation Resistance: R: Negative L: Negative Horizontal Adduction: Not examined Scapular Assist: R: Not tested L: Not tested  Labral Tear Biceps Load II (120 elevation, full ER, 90 elbow flexion, full supination, resisted elbow flexion): R: Negative L: Negative Crank (160 scaption, axial load with IR/ER): R: Positive L: Negative Active  Compression Test: R: Not tested L: Not tested  Bicep Tendon Pathology Speed (shoulder flexion to 90, external rotation, full elbow extension, and forearm supination with resistance: Not examined Yergason's (resisted shoulder ER and supination/biceps tendon pathology): R: Positive L: Positive  Shoulder Instability Sulcus Sign: R: Not tested L: Not tested Anterior Apprehension: R: Negative L: Negative  Beighton scale: Deferred   Back Assessment 05/04/21  Pertinent Back Pain History [name] is a 66 y.o. year old, male patient referred to physical therapy by pain management for bilateral shoulder pain. He also reports a history of chronic low back pain. He has had intermittent occasional acute flares of back pain for decades. However pain worsened around 2017 when he noticed LLE sciatica and started having LLE weakness/buckling. He saw a chiropractor in [place] who thought it was better to send him for [facility] which significantly helped the pain. He traveled to [place] and while he was there his pain worsened. When he returned he had another ESI which improved his pain however it eventually returned.  For the last two years that he was working he could hardly walk due to the pain. He saw Dr. Sabra Heck (ortho) who referred him to Dr. Holley Raring with pain management. Pt has had lumbar injections and the pain improves with injections however it return after about a month. He had a radiofrequency ablation for his L lumbar spine in early February and R side of his lumbar spine in late February. Once he had the procedure on the R side he had complete resolution of his pain however it has gradually returned on the L side. Pain radiates into his L buttock down to the L knee. He is currently on a course prednisone.   Pain:  Pain Intensity: Present: 2/10, Best: 0/10,  Worst: 10/10 Pain location: Central low back spreading out to the left side; Pain Quality: aching, deep Radiating: Yes, into the L buttock and  down the L posterior thigh to the knee  Numbness/Tingling: Yes, numbness/tingling into L calf and in L foot Focal Weakness: Yes, occasional feeling of bilateral LE weakness but not as bad as it had been previously Aggravating factors: walking, sometimes starts spontaneously, extended sitting in the evening,  Relieving factors: pain medications (takes infrequently), sometimes laying down helps, heat (pt unsure if its the heat or laying down that actually helps) 24-hour pain behavior: Better in the AM, worse as the day progresses; Imaging: Lumbar radiograph 12/08/20: FINDINGS: 13 mm anterolisthesis L5 on S1. 5 mm retrolisthesis L4 on L5. 7 mm retrolisthesis L2 on L3. Vertebral body heights are maintained. There is mild scoliosis of the lumbar spine. Multilevel degenerative change with marked disc space narrowing and osteophyte at L4-L5, mild degenerative changes at T12-L1, L1-L2 and L2-L3. Facet degenerative changes at multiple levels. Aortic atherosclerosis. With flexion, grossly stable 13 mm anterolisthesis L5 on S1 and retrolisthesis L4 on L5. Retrolisthesis at L2-L3 slightly diminishes to 4 mm. With extension, similar 13 mm anterolisthesis L5 on S1 and similar retrolisthesis L4 on L5. 8 mm retrolisthesis L2 on L3.  Lumbar MRI 09/12/18: (see entire report however significant findings below): Baseline narrowing of the vertebral canal due to congenitally short pedicles, degenerative changes thorughout the lumbar spine including moderate to severe left neuroforaminal narrowing at L4-5, bilateral mildly diastatic pars defects trace anterolisthesis L5 on S1, mild R facet edema at L4-5 and mild periarticular soft tissue edema at L4-5 bilaterally, mild R facet edema at L5-S1, sclerotic lesion in the L2 vertebral body and the left aspect of the S2 body. Given the patient's history of prostate cancer, sequela of prostate metastasis cannot be excluded.  History of prior back injury, pain, surgery, or therapy: Yes,  Nicole Kindred PT years ago for back pain, has had chiropractic care as well  Red flags: History of prostate cancer, progressive weight loss over the last couple years. Denies bowel/bladder changes, saddle paresthesia, h/o spinal tumors, h/o compression fx, h/o abdominal aneurysm, abdominal pain, chills/fever, night sweats, nausea, vomiting;  Precautions: None  Weight Bearing Restrictions: No   Patient Goals: Decrease back pain and improve his ability to be active and exercise;   OBJECTIVE:   Cognition Patient is oriented to person, place, and time.  Recent memory is intact.  Remote memory is intact.  Attention span and concentration are intact.  Expressive speech is intact.  Patient's fund of knowledge is within normal limits for educational level.     Gross Musculoskeletal Assessment Tremor: None Bulk: Normal Tone: Normal No visible step-off along spinal column;  GAIT: Deferred  Posture: Lumbar lordosis: WNL Iliac crest height: Equal bilaterally Lumbar lateral shift: Negative History of scoliosis reported on imaging but not evident during physical exam today  AROM  AROM (Normal range in degrees) AROM  06/08/2021  Lumbar   Flexion (65) WNL  Extension (30) Mild deficit  Right lateral flexion (25) Mild deficit  Left lateral flexion (25) Mild deficit  Right rotation (30) Mild deficit  Left rotation (30) Mild deficit      Hip Right Left  Flexion (125) WNL WNL  Extension (15) Moderate limitation Moderate limitation  Abduction (40)    Adduction     Internal Rotation (45) >30 degrees >30 degrees  External Rotation (45) >30 degrees >30 degrees       Knee  Flexion (135) WNL WNL  Extension (0) WNL WNL      Ankle    Dorsiflexion (20)    Plantarflexion (50)    Inversion (35)    Eversion (15    (* = pain; Blank rows = not tested)   LE MMT:  MMT (out of 5) Right 06/08/2021 Left 06/08/2021  Hip flexion 5 5  Hip extension    Hip abduction (seated) 5 5  Hip  adduction (seated) 5 5  Hip internal rotation 5 5  Hip external rotation 5 5  Knee flexion 5 5  Knee extension 5 5  Ankle dorsiflexion 5 5  Ankle plantarflexion    Ankle inversion    Ankle eversion    (* = pain; Blank rows = not tested)   Sensation Grossly intact to light touch bilateral LEs as determined by testing dermatomes L2-S2. Proprioception, and hot/cold testing deferred on this date.   Reflexes Deferred   Muscle Length Hamstrings: R: Positive for limitation around 70 degrees L: Positive for limitation around 70 degrees E[place] (quadriceps): R: Not examined L: Not examined Thomas (hip flexors): R: Not examined L: Not examined Ober: R: Not examined L: Not examined   Palpation  Location LEFT  RIGHT           Lumbar paraspinals 1 1  Quadratus Lumborum    Gluteus Maximus 0 0  Gluteus Medius 0 0  Deep hip external rotators 1 0  PSIS 0 0  Fortin's Area (SIJ) 0 0  Greater Trochanter 0 0  (Blank rows = not tested) Graded on 0-4 scale (0 = no pain, 1 = pain, 2 = pain with wincing/grimacing/flinching, 3 = pain with withdrawal, 4 = unwilling to allow palpation), (Blank rows = not tested)   Passive Accessory Intervertebral Motion Pt reports mild back pain with CPA L3-L5 and UPA bilaterally L3-L5. Generally, hypomobile throughout    SPECIAL TESTS Lumbar Radiculopathy and Discogenic: Centralization and Peripheralization (SN 92, -LR 0.12): Not examined Slump (SN 83, -LR 0.32): R: Negative L: Negative SLR (SN 92, -LR 0.29): R: Negative L:  Negative Crossed SLR (SP 90): R: Negative L: Negative  Facet Joint: Extension-Rotation (SN 100, -LR 0.0): R: Negative L: Negative  Lumbar Foraminal Stenosis: Lumbar quadrant (SN 70): R: Negative L: Negative  Hip: FABER (SN 81): R: Negative L: Negative FADIR (SN 94): R: Negative L: Negative Hip scour (SN 50): R: Negative L: Negative  SIJ:  Thigh Thrust (SN 88, -LR 0.18) : R: Not examined L: Not examined  Piriformis  Syndrome: FAIR Test (SN 88, SP 83): R: Not examined L: Not examined  Functional Tasks Deferred  Beighton scale: Deferred   TODAY'S TREATMENT  SUBJECTIVE: Pt reports that he is doing alright today. He denies any low back pain upon arrival but states that his L shoulder has started to hurt more. He thinks he is having muscle spasms in his L shoulder. His R shoulder is improving slightly but is still painful.  PAIN: 2/10 R shoulder pain, 3/10 L shoulder pain (spasms);   Ther-ex  NuStep (seat 13, arms 11) L2-3 BUE/BLE x 6 minutes for warm-up during interval history; Standing exercises with 3# ankle weights: Hip flexion marches 2 x 10 BLE; Hip abduction 2 x 10 BLE; Hamstring curls 2 x 10 BLE; Hip extension 2 x 10 BLE;  Double leg heel raises 2 x 10; Seated LAQ with 3# ankle weight (AW) x 10, x 15 BLE Seated chest press with cane and manual resistance from therapist  2 x 20; Seated rows with cane and manual resistance from therapist 2 x 20; Seated shoulder flexion 4# dumbbell (DB) in LUE, 1# DB in RUE 2 x 10 BUE; Seated overhead shoulder press with 4# DB in LUE, 1# DB in RUE x 12, x 10 BUE; Seated bicep curls with 4# DB in LUE, 1# DB in RUE x 15, x 10 BUE; Mini squats 2 x 10; Seated clams with blue tband x 30, x 35 BLE; Seated adductor ball squeeze x 20, x 30 BLE;    Not performed today: Seated Nautilus rows 30# x 10, 40# 2 x 10; Seated Nautilus chest press 40# 3 x 10; Seated Pallof press with green tband 2 x 10 toward each side; Supine (incline) SLR 2 x 10 RLE, x 12/x 10 LLE; Supine (incline) partial bridges 2 x 12; Seated marches with 5# AW x 25 BLE; Seated shoulder scaption 5# DB in LUE x 15, 1# DB in RUE x 20; Seated shoulder "Ws" with green tband resistance x 10; Seated shoulder IR with green tband resistance x 10, x 15 RLE, 2 x 15 LUE;   PATIENT EDUCATION:  Education details: Pt educated throughout session about proper posture and technique with exercises. Improved  exercise technique, movement at target joints, use of target muscles after min to mod verbal, visual, tactile cues Person educated: Patient Education method: Explanation Education comprehension: verbalized understanding and returned demonstration;   HOME EXERCISE PROGRAM: Access Code: [email or web address] URL: [email or web address] Date: 06/08/2021 Prepared by: Roxana Hires  Exercises - Single Arm Shoulder Flexion with Dumbbell  - 1 x daily - 3 x weekly - 2-3 sets - 10 reps - 2s hold - Single Arm Scaption with Dumbbell  - 1 x daily - 3 x weekly - 2-3 sets - 10 reps - 2s hold - Seated Shoulder Row with Anchored Resistance  - 1 x daily - 3 x weekly - 2-3 sets - 10 reps - 2s hold - Shoulder W - External Rotation with Resistance  - 1 x daily - 3 x weekly - 2-3 sets - 10 reps - 2s hold - Supine Active Straight Leg Raise  - 1 x daily - 7 x weekly - 2-3 sets - 10 reps - 2s hold - Seated March with Resistance  - 1 x daily - 7 x weekly - 2-3 sets - 10 reps - 2s hold - Seated Hip Abduction with Resistance  - 1 x daily - 7 x weekly - 2-3 sets - 10 reps - 2s hold - Seated Hip Adduction Isometrics with Ball  - 1 x daily - 7 x weekly - 2-3 sets - 10 reps - 2s hold - Mini Squat with Counter Support  - 1 x daily - 7 x weekly - 2-3 sets - 10 reps - 2s hold   ASSESSMENT:  CLINICAL IMPRESSION:  Continued upper quarter strengthening during session today as well as hip and abdominal exercises. Pt reports appropriate muscle fatigue and demonstrates excellent effort. Regressed dumbbell in LUE to 4# due to slight increase in pain recently with muscle spasms in L shoulder. Updated HEP to include additional strengthening. Pt encouraged to follow-up with PT as scheduled. He still has an appointment scheduled with urology on 06/14/21. He will benefit from PT services to address deficits in strength and pain in order to return to full function at home and with leisure activities.    REHAB POTENTIAL:  Good  CLINICAL DECISION MAKING: Stable/uncomplicated  EVALUATION COMPLEXITY: Low   GOALS: Goals  reviewed with patient? No  SHORT TERM GOALS: Target date: 07/20/2021  Pt will be independent with HEP to improve strength and decrease shoulder and back pain to improve pain-free function at home and work. Baseline:  Goal status: ONGOING   LONG TERM GOALS: Target date: 07/22/2021  Pt will increase shoulder FOTO to at least 62 to demonstrate significant improvement in function at home and work related to shoulder pain  Baseline: 04/29/21: 50, 05/30/21: 57 Goal status: PARTIALLY MET  2.  Pt will report at least 75% improvement in shoulder symptoms with all activities and feel confident with gym-based exercise program in order to maintain pain-free shoulder function. Baseline: 05/30/21: Pt reports minimal shoulder pain since starting therapy until this weekend with new injury Goal status: ACHIEVED  3. Pt will decrease mODI scoreby at least 13 points in order demonstrate clinically significant reduction in pain/disability related to his back pain Baseline: 05/30/21: 72% Goal status: INITIAL  4. Pt will decrease worst back pain as reported on NPRS by at least 2 points in order to demonstrate clinically significant reduction in back pain.  Baseline: 05/30/21: worst: 10/10 Goal status: INITIAL    PLAN: PT FREQUENCY: 2x/week  PT DURATION: 12 weeks  PLANNED INTERVENTIONS: Therapeutic exercises, Therapeutic activity, Neuromuscular re-education, Balance training, Gait training, Patient/Family education, Joint mobilization, Vestibular training, Canalith repositioning, Aquatic Therapy, Dry Needling, Cognitive remediation, Electrical stimulation, Spinal manipulation, Spinal mobilization, Cryotherapy, Moist heat, Traction, Ultrasound, Ionotophoresis 22m/ml Dexamethasone, and Manual therapy  PLAN FOR NEXT SESSION: Continue strengthening for shoulders and back, review and modify HEP as needed,  JLyndel Safe Keilynn Marano PT, DPT, GCS  Andris Brothers 06/08/2021, 2:50 PM

## 2021-06-08 ENCOUNTER — Ambulatory Visit: Payer: Medicare Other

## 2021-06-08 DIAGNOSIS — M25511 Pain in right shoulder: Secondary | ICD-10-CM | POA: Diagnosis not present

## 2021-06-08 DIAGNOSIS — M6281 Muscle weakness (generalized): Secondary | ICD-10-CM

## 2021-06-08 DIAGNOSIS — M5459 Other low back pain: Secondary | ICD-10-CM

## 2021-06-08 DIAGNOSIS — G8929 Other chronic pain: Secondary | ICD-10-CM

## 2021-06-08 DIAGNOSIS — M25512 Pain in left shoulder: Secondary | ICD-10-CM | POA: Diagnosis not present

## 2021-06-14 NOTE — Therapy (Signed)
OUTPATIENT PHYSICAL THERAPY SHOULDER/BACK TREATMENT   Patient Name: [name] MRN: [id number] DOB:[birth year], 66 y.o., male Today's Date: 06/15/2021   PT End of Session - 06/15/21 1414     Visit Number 14    Number of Visits 25    Date for PT Re-Evaluation 07/22/21    Authorization Type eval: 04/29/21    PT Start Time 1400    PT Stop Time 1445    PT Time Calculation (min) 45 min    Activity Tolerance Patient tolerated treatment well    Behavior During Therapy [facility] for tasks assessed/performed              Past Medical History:  Diagnosis Date   Cardiomyopathy (in setting of Afib)    a. 12/2013 Echo: EF 45-50%, mild ant and antsept HK. mild MR. Mod dil LA. nl RV fxn. Rhythm was Afib.   DJD (degenerative joint disease) of knee    History of kidney stones    Intervertebral disc disorder with radiculopathy of lumbosacral region    Kidney stone 2012   Lower extremity edema    PAF (paroxysmal atrial fibrillation) (HCC)    Pernicious anemia    Prostate cancer ([place])    Pulmonary nodule, right    a. 10/2014 Cardiac CTA: 52m RLL nodule; b. 04/2015 CT Chest: stable 7359mRLL nodule. No new nodules; 02/2017 CTA Chest: stable, benign, 59m84mLL pulm nodule.   [facility] tachy[facility]) [facility]  Past Surgical History:  Procedure Laterality Date   CARDIOVERSION N/A 05/15/2019   Procedure: CARDIOVERSION;  Surgeon: GolMinna MerrittsD;  Location: [facility] ORS;  Service: Cardiovascular;  Laterality: N/A;   CARPAL TUNNEL RELEASE Right 12/23/2014   Procedure: CARPAL TUNNEL RELEASE;  Surgeon: HowEarnestine LeysD;  Location: [facility] ORS;  Service: Orthopedics;  Laterality: Right;   CARPAL TUNNEL RELEASE Left 01/06/2015   Procedure: CARPAL TUNNEL RELEASE;  Surgeon: HowEarnestine LeysD;  Location: [facility] ORS;  Service: Orthopedics;  Laterality: Left;   COLONOSCOPY WITH PROPOFOL N/A 10/08/2017   Procedure: COLONOSCOPY WITH PROPOFOL;  Surgeon: VanLin LandsmanD;  [facility];   Service: Gastroenterology;  Laterality: N/A;   GASTRIC BYPASS  2002   KNEE SURGERY     knee trauma   prostate seeding     Patient Active Problem List   Diagnosis Date Noted   Lesion of bone of lumbosacral spine (L5) 05/12/2021   Localized osteoarthritis of shoulder regions, bilateral 03/29/2021   Chronic pain of both shoulders 03/29/2021   Drug-induced myopathy 02/21/2021   Trigger finger of right hand 11/15/2020   Prostate cancer ([place]/11/2020   Insomnia 08/01/2019   Primary osteoarthritis of both wrists 02/17/2019   Trigger middle finger of left hand 02/17/2019   Chronic pain syndrome 02/17/2019   Chronic radicular lumbar pain 10/08/2018   Lumbar radiculopathy 10/08/2018   Lumbar degenerative disc disease 10/08/2018   Lumbar facet arthropathy 10/08/2018   Lumbar facet joint syndrome 10/08/2018   Intervertebral disc disorder with radiculopathy of lumbosacral region    Pernicious anemia 05/22/2016   Paroxysmal atrial fibrillation ([place]2/04/2015   DJD (degenerative joint disease) of knee 10/27/2014   Bilateral carpal tunnel syndrome 10/27/2014   Morbid obesity with BMI of 50.0-59.9, adult ([place]CC[facility]0/11/2014   Mixed hyperlipidemia 10/27/2014   H/O gastric bypass 03/20/2014   Hyperkalemia 03/20/2014   History of prostate cancer 12/26/2013   Essential hypertension 12/26/2013   Encounter for anticoagulation discussion and counseling 12/26/2013    PCP: [name]  J, DO  REFERRING PROVIDER: Nobie Hill *  REFERRING DIAGNOSIS: M19.011,M19.012 (ICD-10-CM) - Localized osteoarthritis of shoulder regions, bilateral, M65.30 (ICD-10-CM) - Trigger finger of right hand, unspecified finger, M25.511,G89.29,M25.512 (ICD-10-CM) - Chronic pain of both shoulders, M53.3 (ICD-10-CM) - Sacroiliac joint pain, G89.4 (ICD-10-CM) - Chronic pain syndrome  THERAPY DIAG: Other low back pain  Chronic left shoulder pain  Chronic right shoulder pain  Muscle weakness  (generalized)  ONSET DATE: 04/30/19  FOLLOW UP APPT WITH PROVIDER: Yes    From initial evaluation (04/29/21)  SUBJECTIVE:                                                                                                                                                                                         Chief Complaint: Bilateral shoulder pain  Pertinent History [name] is a 66 y.o. year old, male patient referred to physical therapy by pain management for bilateral shoulder pain. Pt reports chronic bilateral shoulder pain for approximately the last 2 years. The pain started in his R wrist which improved after an injection however the pain gradually started worsening and then moving up his R arm to his R shoulder. Eventually he started having pain in his L shoulder as well. He noticed the most significant pain when lifting his arms to wash under them while bathing. He had injections in both shoulders on March 27th with significant improvement in pain since that time. At this point pt states that he is not having any shoulder pain. He has a past medical history of Cardiomyopathy (in setting of Afib), DJD (degenerative joint disease) of knee, History of kidney stones, Intervertebral disc disorder with radiculopathy of lumbosacral region, Kidney stone (2012), Lower extremity edema, PAF (paroxysmal atrial fibrillation) ([place]), Pernicious anemia, Prostate cancer ([place]), Pulmonary nodule, right, and SVT (supraventricular tachycardia) ([place]). He also  has a past surgical history that includes Knee surgery; Gastric bypass (2002); prostate seeding; Carpal tunnel release (Right, 12/23/2014); Carpal tunnel release (Left, 01/06/2015); Colonoscopy with propofol (N/A, 10/08/2017); and Cardioversion (N/A, 05/15/2019). He has tapered off a lot of his medications over the last few months because he felt like a lot of his symptoms were coming from medication side effects. He has also noted that he started having  anxiety attacks since January which is new for him. Pt and his wife joined [employer] and he recently ordered an upright walker with forearm supports to help his mobility. Pt reports losing 41# in the last 2 years secondary to drop in his calorie intake.    Pain:  Pain Intensity: Present: 0/10, Best: 0/10, Worst: 0/10 (not currently hurting) Pain location: Bilateral shoulders  Pain Quality: sharp and aching  Radiating: No  Numbness/Tingling: Yes, bilateral hand numbness occasionally as well as bilateral foot numbness.  Focal Weakness: No, however pt has noticed weakness in both shoulders and hands. Aggravating factors: Lifting arms; Relieving factors: injections, pain medication, no benefit with heat/topical creams, unable to take NSAIDs secondary to taking Xarelto 24-hour pain behavior: Constant pain all the time (not currently); History of prior shoulder or neck injury, pain, surgery, or therapy: No Falls: Has patient fallen in last 6 months? No,  Dominant hand: right Imaging: Yes, R shoulder radiographs (03/29/21): Mild glenohumeral and mild acromioclavicular osteoarthritis; L shoulder radiographs (03/29/21):  Mild-to-moderate glenohumeral and mild acromioclavicular osteoarthritis. 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 Otherwise negative  Precautions: None  Weight Bearing Restrictions: No  Living Environment Lives with: lives with their spouse Lives in: House/apartment  Patient Goals: Improve shoulder strength and prevent pain from returning. Pt would also like to address his back  pain/sciatica;   OBJECTIVE:   Patient Surveys  FOTO: 27, predicted improvement to 12  Cognition Patient is oriented to person, place, and time.  Recent memory is intact.  Remote memory is intact.  Attention span and concentration are intact.  Expressive speech is intact.  Patient's fund of knowledge is within normal limits for educational level.   Gross Musculoskeletal Assessment Tremor: None Bulk: Normal Tone: Normal  Gait Deferred  Posture Forward head and rounded shoulders in both sitting and standing;  Cervical Screen AROM: WFL and painless with overpressure in all planes Spurlings A (ipsilateral lateral flexion/axial compression): R: Negative L: Negative Spurlings B (ipsilateral lateral flexion/contralateral rotation/axial compression): R: Negative L: Negative Repeated movement: Not tested Hoffman Sign (cervical cord compression): R: Positive L: Negative ULTT Median: R: Not examined L: Not examined ULTT Ulnar: R: Not examined L: Not examined ULTT Radial: R: Not examined L: Not examined   AROM  AROM (Normal range in degrees) AROM 06/15/2021  Cervical  Flexion (50) WNL  Extension (80) WNL  Right lateral flexion (45) WNL  Left lateral flexion (45) WNL  Right rotation (85) WNL  Left rotation (85) WNL     Right Left  Shoulder    Flexion 177 170  Extension    Abduction 155 146  External Rotation 90 90  Internal Rotation 70 70    Elbow    Flexion WNL WNL  Extension WNL WNL  Pronation WNL WNL  Supination WNL WNL  (* = pain; Blank rows = not tested)    LE MMT:  MMT (out of 5) Right 06/15/2021 Left 06/15/2021  Cervical (isometric)  Flexion   Extension   Lateral Flexion    Rotation        Shoulder   Flexion 4+ 5  Extension    Abduction 5 5*  External rotation (seated) 5 5  Internal rotation (seated) 5 5  Horizontal abduction    Horizontal adduction    Lower Trapezius    Rhomboids        Elbow  Flexion 5 5  Extension 5 5  Pronation 5 5   Supination 5 5      Wrist  Flexion 5 5  Extension 5 5  Radial deviation 5 5  Ulnar deviation  5 5      MCP  Flexion    Extension    Abduction 5 5  Adduction 5 5  (* = pain; Blank rows = not tested)  Sensation Deferred  Reflexes Deferred   Palpation No pain with palpation to anterior, lateral, and posterior shoulders bilaterally;   Passive Accessory Intervertebral Motion Deferred    Accessory Motions/Glides Glenohumeral: Posterior: R: abnormal (hypomobile) L: abnormal (hypomobile) Inferior: R: not examined L: not examined Anterior: R: not examined L: not examined  Acromioclavicular:  Posterior: R: not examined L: not examined Anterior: R: not examined L: not examined  Sternoclavicular: Posterior: R: not examined L: not examined Anterior: R: not examined L: not examined Superior: R: not examined L: not examined Inferior: R: not examined L: not examined  Scapulothoracic: Distraction: R: not examined L: not examined Medial: R: not examined L: not examined Lateral: R: not examined L: not examined Inferior: R: not examined L: not examined Superior: R: not examined L: not examined  Muscle Length Testing Deferred    SPECIAL TESTS  Rotator Cuff  Drop Arm Test: Negative Painful Arc (Pain from 60 to 120 degrees scaption): Negative Infraspinatus Muscle Test: Negative  Subacromial Impingement Hawkins-Kennedy: R: Negative L: Negative Neer (Block scapula, PROM flexion): R: Negative L: Negative Painful Arc (Pain from 60 to 120 degrees scaption): R: Negative L: Negative Empty Can: R: Not tested L: Not tested External Rotation Resistance: R: Negative L: Negative Horizontal Adduction: Not examined Scapular Assist: R: Not tested L: Not tested  Labral Tear Biceps Load II (120 elevation, full ER, 90 elbow flexion, full supination, resisted elbow flexion): R: Negative L: Negative Crank (160 scaption, axial load with IR/ER): R: Positive L: Negative Active  Compression Test: R: Not tested L: Not tested  Bicep Tendon Pathology Speed (shoulder flexion to 90, external rotation, full elbow extension, and forearm supination with resistance: Not examined Yergason's (resisted shoulder ER and supination/biceps tendon pathology): R: Positive L: Positive  Shoulder Instability Sulcus Sign: R: Not tested L: Not tested Anterior Apprehension: R: Negative L: Negative  Beighton scale: Deferred   Back Assessment 05/04/21  Pertinent Back Pain History Mr. MAHONRI SEIDEN is a 66 y.o. year old, male patient referred to physical therapy by pain management for bilateral shoulder pain. He also reports a history of chronic low back pain. He has had intermittent occasional acute flares of back pain for decades. However pain worsened around 2017 when he noticed LLE sciatica and started having LLE weakness/buckling. He saw a chiropractor in [place] who thought it was better to send him for [facility] which significantly helped the pain. He traveled to [place] and while he was there his pain worsened. When he returned he had another ESI which improved his pain however it eventually returned.  For the last two years that he was working he could hardly walk due to the pain. He saw Dr. Sabra Heck (ortho) who referred him to Dr. Holley Raring with pain management. Pt has had lumbar injections and the pain improves with injections however it return after about a month. He had a radiofrequency ablation for his L lumbar spine in early February and R side of his lumbar spine in late February. Once he had the procedure on the R side he had complete resolution of his pain however it has gradually returned on the L side. Pain radiates into his L buttock down to the L knee. He is currently on a course prednisone.   Pain:  Pain Intensity: Present: 2/10, Best:  0/10, Worst: 10/10 Pain location: Central low back spreading out to the left side; Pain Quality: aching, deep Radiating: Yes, into the L buttock and  down the L posterior thigh to the knee  Numbness/Tingling: Yes, numbness/tingling into L calf and in L foot Focal Weakness: Yes, occasional feeling of bilateral LE weakness but not as bad as it had been previously Aggravating factors: walking, sometimes starts spontaneously, extended sitting in the evening,  Relieving factors: pain medications (takes infrequently), sometimes laying down helps, heat (pt unsure if its the heat or laying down that actually helps) 24-hour pain behavior: Better in the AM, worse as the day progresses; Imaging: Lumbar radiograph 12/08/20: FINDINGS: 13 mm anterolisthesis L5 on S1. 5 mm retrolisthesis L4 on L5. 7 mm retrolisthesis L2 on L3. Vertebral body heights are maintained. There is mild scoliosis of the lumbar spine. Multilevel degenerative change with marked disc space narrowing and osteophyte at L4-L5, mild degenerative changes at T12-L1, L1-L2 and L2-L3. Facet degenerative changes at multiple levels. Aortic atherosclerosis. With flexion, grossly stable 13 mm anterolisthesis L5 on S1 and retrolisthesis L4 on L5. Retrolisthesis at L2-L3 slightly diminishes to 4 mm. With extension, similar 13 mm anterolisthesis L5 on S1 and similar retrolisthesis L4 on L5. 8 mm retrolisthesis L2 on L3.  Lumbar MRI 09/12/18: (see entire report however significant findings below): Baseline narrowing of the vertebral canal due to congenitally short pedicles, degenerative changes thorughout the lumbar spine including moderate to severe left neuroforaminal narrowing at L4-5, bilateral mildly diastatic pars defects trace anterolisthesis L5 on S1, mild R facet edema at L4-5 and mild periarticular soft tissue edema at L4-5 bilaterally, mild R facet edema at L5-S1, sclerotic lesion in the L2 vertebral body and the left aspect of the S2 body. Given the patient's history of prostate cancer, sequela of prostate metastasis cannot be excluded.  History of prior back injury, pain, surgery, or therapy: Yes,  Nicole Kindred PT years ago for back pain, has had chiropractic care as well  Red flags: History of prostate cancer, progressive weight loss over the last couple years. Denies bowel/bladder changes, saddle paresthesia, h/o spinal tumors, h/o compression fx, h/o abdominal aneurysm, abdominal pain, chills/fever, night sweats, nausea, vomiting;  Precautions: None  Weight Bearing Restrictions: No   Patient Goals: Decrease back pain and improve his ability to be active and exercise;   OBJECTIVE:   Cognition Patient is oriented to person, place, and time.  Recent memory is intact.  Remote memory is intact.  Attention span and concentration are intact.  Expressive speech is intact.  Patient's fund of knowledge is within normal limits for educational level.     Gross Musculoskeletal Assessment Tremor: None Bulk: Normal Tone: Normal No visible step-off along spinal column;  GAIT: Deferred  Posture: Lumbar lordosis: WNL Iliac crest height: Equal bilaterally Lumbar lateral shift: Negative History of scoliosis reported on imaging but not evident during physical exam today  AROM  AROM (Normal range in degrees) AROM  06/15/2021  Lumbar   Flexion (65) WNL  Extension (30) Mild deficit  Right lateral flexion (25) Mild deficit  Left lateral flexion (25) Mild deficit  Right rotation (30) Mild deficit  Left rotation (30) Mild deficit      Hip Right Left  Flexion (125) WNL WNL  Extension (15) Moderate limitation Moderate limitation  Abduction (40)    Adduction     Internal Rotation (45) >30 degrees >30 degrees  External Rotation (45) >30 degrees >30 degrees       Knee  Flexion (135) WNL WNL  Extension (0) WNL WNL      Ankle    Dorsiflexion (20)    Plantarflexion (50)    Inversion (35)    Eversion (15    (* = pain; Blank rows = not tested)   LE MMT:  MMT (out of 5) Right 06/15/2021 Left 06/15/2021  Hip flexion 5 5  Hip extension    Hip abduction (seated) 5 5  Hip  adduction (seated) 5 5  Hip internal rotation 5 5  Hip external rotation 5 5  Knee flexion 5 5  Knee extension 5 5  Ankle dorsiflexion 5 5  Ankle plantarflexion    Ankle inversion    Ankle eversion    (* = pain; Blank rows = not tested)   Sensation Grossly intact to light touch bilateral LEs as determined by testing dermatomes L2-S2. Proprioception, and hot/cold testing deferred on this date.   Reflexes Deferred   Muscle Length Hamstrings: R: Positive for limitation around 70 degrees L: Positive for limitation around 70 degrees Ely (quadriceps): R: Not examined L: Not examined Thomas (hip flexors): R: Not examined L: Not examined Ober: R: Not examined L: Not examined   Palpation  Location LEFT  RIGHT           Lumbar paraspinals 1 1  Quadratus Lumborum    Gluteus Maximus 0 0  Gluteus Medius 0 0  Deep hip external rotators 1 0  PSIS 0 0  Fortin's Area (SIJ) 0 0  Greater Trochanter 0 0  (Blank rows = not tested) Graded on 0-4 scale (0 = no pain, 1 = pain, 2 = pain with wincing/grimacing/flinching, 3 = pain with withdrawal, 4 = unwilling to allow palpation), (Blank rows = not tested)   Passive Accessory Intervertebral Motion Pt reports mild back pain with CPA L3-L5 and UPA bilaterally L3-L5. Generally, hypomobile throughout    SPECIAL TESTS Lumbar Radiculopathy and Discogenic: Centralization and Peripheralization (SN 92, -LR 0.12): Not examined Slump (SN 83, -LR 0.32): R: Negative L: Negative SLR (SN 92, -LR 0.29): R: Negative L:  Negative Crossed SLR (SP 90): R: Negative L: Negative  Facet Joint: Extension-Rotation (SN 100, -LR 0.0): R: Negative L: Negative  Lumbar Foraminal Stenosis: Lumbar quadrant (SN 70): R: Negative L: Negative  Hip: FABER (SN 81): R: Negative L: Negative FADIR (SN 94): R: Negative L: Negative Hip scour (SN 50): R: Negative L: Negative  SIJ:  Thigh Thrust (SN 88, -LR 0.18) : R: Not examined L: Not examined  Piriformis  Syndrome: FAIR Test (SN 88, SP 83): R: Not examined L: Not examined  Functional Tasks Deferred  Beighton scale: Deferred   TODAY'S TREATMENT  SUBJECTIVE: Pt reports that he has been having a lot of pain over the course of the last week.  He rates his low back pain as 5/10 and shoulder pain as 3/10 upon arrival today. He scheduled a follow-up appointment with pain management (Dr. Holley Raring). He saw urology who ordered labwork but otherwise was not concerned about his MRI findings. No specific questions currently.  PAIN: 3/10 shoulder pain, 5/10 back pain   Ther-ex  NuStep (seat 13, arms 11) L2 BUE/BLE x 5 minutes for warm-up during interval history  Seated LAQ with 5# ankle weight (AW) x 15 BLE, deferred second set secondary to R knee pain; Seated chest press with cane and manual resistance from therapist 2 x 20; Seated rows with cane and manual resistance from therapist 2 x 20; Seated shoulder flexion 4#  dumbbell (DB) in LUE, 1# DB in RUE 2 x 15 BUE; Seated shoulder scaption 4# dumbbell (DB) in LUE, 1# DB in RUE 2 x 15 BUE; Seated overhead shoulder press with 4# DB in LUE, 1# DB in RUE x 14; Seated bicep curls with 4# DB in LUE, 1# DB in RUE x 20 BUE; Seated clams with blue tband x 15, x 20  BLE; Seated adductor ball squeeze 2 x 20 BLE;  Seated marches with 5# AW 2 x 20 BLE;   Not performed today: Double leg heel raises 2 x 10 Seated Nautilus rows 30# x 10, 40# 2 x 10; Seated Nautilus chest press 40# 3 x 10; Seated Pallof press with green tband 2 x 10 toward each side; Supine (incline) SLR 2 x 10 RLE, x 12/x 10 LLE; Supine (incline) partial bridges 2 x 12; Seated shoulder "Ws" with green tband resistance x 10; Seated shoulder IR with green tband resistance x 10, x 15 RLE, 2 x 15 LUE; Mini squats 2 x 10; Standing exercises with 3# ankle weights: Hip flexion marches 2 x 10 BLE; Hip abduction 2 x 10 BLE; Hamstring curls 2 x 10 BLE; Hip extension 2 x 10 BLE;   PATIENT  EDUCATION:  Education details: Pt educated throughout session about proper posture and technique with exercises. Improved exercise technique, movement at target joints, use of target muscles after min to mod verbal, visual, tactile cues Person educated: Patient Education method: Explanation Education comprehension: verbalized understanding and returned demonstration;   HOME EXERCISE PROGRAM: Access Code: [email or web address] URL: [email or web address] Date: 06/08/2021 Prepared by: Roxana Hires  Exercises - Single Arm Shoulder Flexion with Dumbbell  - 1 x daily - 3 x weekly - 2-3 sets - 10 reps - 2s hold - Single Arm Scaption with Dumbbell  - 1 x daily - 3 x weekly - 2-3 sets - 10 reps - 2s hold - Seated Shoulder Row with Anchored Resistance  - 1 x daily - 3 x weekly - 2-3 sets - 10 reps - 2s hold - Shoulder W - External Rotation with Resistance  - 1 x daily - 3 x weekly - 2-3 sets - 10 reps - 2s hold - Supine Active Straight Leg Raise  - 1 x daily - 7 x weekly - 2-3 sets - 10 reps - 2s hold - Seated March with Resistance  - 1 x daily - 7 x weekly - 2-3 sets - 10 reps - 2s hold - Seated Hip Abduction with Resistance  - 1 x daily - 7 x weekly - 2-3 sets - 10 reps - 2s hold - Seated Hip Adduction Isometrics with Ball  - 1 x daily - 7 x weekly - 2-3 sets - 10 reps - 2s hold - Mini Squat with Counter Support  - 1 x daily - 7 x weekly - 2-3 sets - 10 reps - 2s hold   ASSESSMENT:  CLINICAL IMPRESSION:  Continued upper quarter strengthening during session today as well as hip and abdominal exercises. Pt reports appropriate muscle fatigue and demonstrates excellent effort. Continued with 4# dumbbell in LUE and 1# dumbbell in RUE due to recent worsening of shoulder pain. Pt has an appointment to follow-up with pain management and discuss his worsening shoulder pain, especially the R side since the acute injury. Pt encouraged to follow-up with PT as scheduled. He will benefit from PT services to  address deficits in strength and pain in order to return to full function at home and  with leisure activities.    REHAB POTENTIAL: Good  CLINICAL DECISION MAKING: Stable/uncomplicated  EVALUATION COMPLEXITY: Low   GOALS: Goals reviewed with patient? No  SHORT TERM GOALS: Target date: 07/27/2021  Pt will be independent with HEP to improve strength and decrease shoulder and back pain to improve pain-free function at home and work. Baseline:  Goal status: ONGOING   LONG TERM GOALS: Target date: 07/22/2021  Pt will increase shoulder FOTO to at least 62 to demonstrate significant improvement in function at home and work related to shoulder pain  Baseline: 04/29/21: 50, 05/30/21: 57 Goal status: PARTIALLY MET  2.  Pt will report at least 75% improvement in shoulder symptoms with all activities and feel confident with gym-based exercise program in order to maintain pain-free shoulder function. Baseline: 05/30/21: Pt reports minimal shoulder pain since starting therapy until this weekend with new injury Goal status: ACHIEVED  3. Pt will decrease mODI scoreby at least 13 points in order demonstrate clinically significant reduction in pain/disability related to his back pain Baseline: 05/30/21: 72% Goal status: INITIAL  4. Pt will decrease worst back pain as reported on NPRS by at least 2 points in order to demonstrate clinically significant reduction in back pain.  Baseline: 05/30/21: worst: 10/10 Goal status: INITIAL    PLAN: PT FREQUENCY: 2x/week  PT DURATION: 12 weeks  PLANNED INTERVENTIONS: Therapeutic exercises, Therapeutic activity, Neuromuscular re-education, Balance training, Gait training, Patient/Family education, Joint mobilization, Vestibular training, Canalith repositioning, Aquatic Therapy, Dry Needling, Cognitive remediation, Electrical stimulation, Spinal manipulation, Spinal mobilization, Cryotherapy, Moist heat, Traction, Ultrasound, Ionotophoresis 57m/ml Dexamethasone,  and Manual therapy  PLAN FOR NEXT SESSION: Continue strengthening for shoulders and back, review and modify HEP as needed,  JLyndel SafeHuprich PT, DPT, GCS  Kentley Blyden 06/15/2021, 4:42 PM

## 2021-06-15 ENCOUNTER — Ambulatory Visit: Payer: Medicare Other

## 2021-06-15 DIAGNOSIS — G8929 Other chronic pain: Secondary | ICD-10-CM | POA: Diagnosis not present

## 2021-06-15 DIAGNOSIS — M6281 Muscle weakness (generalized): Secondary | ICD-10-CM | POA: Diagnosis not present

## 2021-06-15 DIAGNOSIS — M5459 Other low back pain: Secondary | ICD-10-CM | POA: Diagnosis not present

## 2021-06-15 DIAGNOSIS — M25512 Pain in left shoulder: Secondary | ICD-10-CM | POA: Diagnosis not present

## 2021-06-15 DIAGNOSIS — M25511 Pain in right shoulder: Secondary | ICD-10-CM | POA: Diagnosis not present

## 2021-06-16 ENCOUNTER — Ambulatory Visit
Payer: Medicare Other | Attending: Student in an Organized Health Care Education/Training Program | Admitting: Student in an Organized Health Care Education/Training Program

## 2021-06-16 ENCOUNTER — Encounter: Payer: Self-pay | Admitting: Student in an Organized Health Care Education/Training Program

## 2021-06-16 VITALS — BP 154/89 | HR 56 | Temp 97.9°F | Resp 18 | Ht 71.0 in | Wt 324.0 lb

## 2021-06-16 DIAGNOSIS — G8929 Other chronic pain: Secondary | ICD-10-CM | POA: Diagnosis not present

## 2021-06-16 DIAGNOSIS — M6530 Trigger finger, unspecified finger: Secondary | ICD-10-CM | POA: Diagnosis not present

## 2021-06-16 DIAGNOSIS — M19012 Primary osteoarthritis, left shoulder: Secondary | ICD-10-CM | POA: Diagnosis not present

## 2021-06-16 DIAGNOSIS — M19011 Primary osteoarthritis, right shoulder: Secondary | ICD-10-CM | POA: Insufficient documentation

## 2021-06-16 DIAGNOSIS — G894 Chronic pain syndrome: Secondary | ICD-10-CM | POA: Insufficient documentation

## 2021-06-16 DIAGNOSIS — M25512 Pain in left shoulder: Secondary | ICD-10-CM | POA: Diagnosis not present

## 2021-06-16 DIAGNOSIS — M25511 Pain in right shoulder: Secondary | ICD-10-CM | POA: Diagnosis not present

## 2021-06-16 MED ORDER — HYDROCODONE-ACETAMINOPHEN 7.5-325 MG PO TABS: 1.0000 | ORAL_TABLET | Freq: Three times a day (TID) | ORAL | 0 refills | Status: DC | PRN

## 2021-06-16 NOTE — Progress Notes (Signed)
PROVIDER NOTE: Information contained herein reflects review and annotations entered in association with encounter. Interpretation of such information and data should be left to medically-trained personnel. Information provided to patient can be located elsewhere in the medical record under "Patient Instructions". Document created using STT-dictation technology, any transcriptional errors that may result from process are unintentional.    Patient: [name]  Service Category: E/M  Provider: Gillis Santa, MD  DOB: 11/18/1955  DOS: 06/16/2021  Specialty: Interventional Pain Management  MRN: [id number]  Setting: Ambulatory outpatient  PCP: Olin Hauser, DO  Type: Established Patient    Referring Provider: Nobie Putnam *  Location: Office  Delivery: Face-to-face     HPI  [name], a 66 y.o. year old male, is here today because of his Localized osteoarthritis of shoulder regions, bilateral [M19.011, M19.012]. Mr. Deterding primary complain today is Back Pain and Shoulder Pain (right) Last encounter: My last encounter with him was on 05/23/2021. Pertinent problems: Mr. Ellithorpe has H/O gastric bypass; DJD (degenerative joint disease) of knee; Bilateral carpal tunnel syndrome; Paroxysmal atrial fibrillation ([place]); Chronic radicular lumbar pain; Lumbar radiculopathy; Lumbar degenerative disc disease; Lumbar facet arthropathy; Lumbar facet joint syndrome; Primary osteoarthritis of both wrists; Trigger middle finger of left hand; and Chronic pain syndrome on their pertinent problem list. Pain Assessment: Severity of Chronic pain is reported as a 6 /10. Location: Back Lower/radiates down left leg. Onset: More than a month ago. Quality: Sharp, Stabbing. Timing: Intermittent. Modifying factor(s): meds. Vitals:  height is 5' 11"  (1.803 m) and weight is 324 lb (147 kg) (abnormal). His temperature is 97.9 F (36.6 C). His blood pressure is 154/89 (abnormal) and his pulse is 56 (abnormal). His  respiration is 18 and oxygen saturation is 99%.   Reason for encounter: evaluation of worsening, or previously known (established) problem.   Patient states that he injured his shoulder working outside a couple of weeks ago.  He is having increased pain with right shoulder abduction.  He responded favorably to his bilateral glenohumeral joint injections done in early March and he would like to get it repeated for the right shoulder since he is experiencing an acute pain flare.  He is also having issues with his right ring trigger finger that he would like to get injected.  This was previously done in March as well.  Refill of hydrocodone as below.  Patient states that he was taking hydrocodone very seldomly prior to his most recent shoulder injury that has aggravated his pain.  On a positive note, he continues to work with physical therapy regularly and has been losing weight.  He has an upcoming physical with his primary care provider.  Pharmacotherapy Assessment  Analgesic: Hydrocodone 7.5 mg TID PRN      Monitoring: [place] PMP: PDMP not reviewed this encounter.       Pharmacotherapy: No side-effects or adverse reactions reported. Compliance: No problems identified. Effectiveness: Clinically acceptable.  [name], RN  06/16/2021 10:24 AM  Sign when Signing Visit Nursing Pain Medication Assessment:  Safety precautions to be maintained throughout the outpatient stay will include: orient to surroundings, keep bed in low position, maintain call bell within reach at all times, provide assistance with transfer out of bed and ambulation.  Medication Inspection Compliance: Pill count conducted under aseptic conditions, in front of the patient. Neither the pills nor the bottle was removed from the patient's sight at any time. Once count was completed pills were immediately returned to the patient in  their original bottle.  Medication: Hydrocodone/APAP Pill/Patch Count:  14 of 90 pills remain Pill/Patch  Appearance: Markings consistent with prescribed medication Bottle Appearance: Standard pharmacy container. Clearly labeled. Filled Date: 04 / 04 / 2023 Last Medication intake:  Yesterday  UDS:  Summary  Date Value Ref Range Status  04/06/2020 Note  Final    Comment:    ==================================================================== [employer] [facility] [employer] (MW) ==================================================================== Test                             Result       Flag       Units  Drug Present and Declared for Prescription Verification   Tramadol                       >4386        EXPECTED   ng/mg creat   O-Desmethyltramadol            3353         EXPECTED   ng/mg creat   N-Desmethyltramadol            >4386        EXPECTED   ng/mg creat    Source of tramadol is a prescription medication. O-desmethyltramadol    and N-desmethyltramadol are expected metabolites of tramadol.  ==================================================================== Test                      Result    Flag   Units      Ref Range   Creatinine              114              mg/dL      >=20 ==================================================================== Declared Medications:  The flagging and interpretation on this report are based on the  following declared medications.  Unexpected results may arise from  inaccuracies in the declared medications.   **Note: The testing scope of this panel includes these medications:   Tramadol   **Note: The testing scope of this panel does not include the  following reported medications:   Acetaminophen (Tylenol)  Amiodarone  Amlodipine  Benazepril  Cyclobenzaprine (Flexeril)  Ezetimibe  Fluticasone  Furosemide  Gabapentin  Menthol  Methyl Salicylate (topical)  Metoprolol  Mirtazapine  Multivitamin  Rivaroxaban  Rosuvastatin  Trazodone  Vitamin B12 ==================================================================== For clinical  consultation, please call [phone]. ====================================================================      ROS  Constitutional: Denies any fever or chills Gastrointestinal: No reported hemesis, hematochezia, vomiting, or acute GI distress Musculoskeletal: Denies any acute onset joint swelling, redness, loss of ROM, or weakness Neurological: No reported episodes of acute onset apraxia, aphasia, dysarthria, agnosia, amnesia, paralysis, loss of coordination, or loss of consciousness  Medication Review  Aspirin, DULoxetine, HYDROcodone-acetaminophen, Ibuprofen, acetaminophen, amLODipine, amiodarone, benazepril, busPIRone, cyanocobalamin, diazepam, fluticasone, furosemide, melatonin, metoprolol succinate, multivitamin with minerals, rivaroxaban, and traZODone  History Review  Allergy: Mr. Domagalski has No Known Allergies. Drug: Mr. Clauson  reports no history of drug use. Alcohol:  reports current alcohol use of about 12.0 standard drinks per week. Tobacco:  reports that he quit smoking about 42 years ago. His smoking use included cigarettes. He has a 10.00 pack-year smoking history. He has quit using smokeless tobacco.  His smokeless tobacco use included snuff. Social: Mr. Balestrieri  reports that he quit smoking about 42 years ago. His smoking use included cigarettes. He has a 10.00  pack-year smoking history. He has quit using smokeless tobacco.  His smokeless tobacco use included snuff. He reports current alcohol use of about 12.0 standard drinks per week. He reports that he does not use drugs. Medical:  has a past medical history of Cardiomyopathy (in setting of Afib), DJD (degenerative joint disease) of knee, History of kidney stones, Intervertebral disc disorder with radiculopathy of lumbosacral region, Kidney stone (2012), Lower extremity edema, PAF (paroxysmal atrial fibrillation) ([place]), Pernicious anemia, Prostate cancer ([place]), Pulmonary nodule, right, and SVT (supraventricular tachycardia)  ([place]). Surgical: Mr. Dibartolo  has a past surgical history that includes Knee surgery; Gastric bypass (2002); prostate seeding; Carpal tunnel release (Right, 12/23/2014); Carpal tunnel release (Left, 01/06/2015); Colonoscopy with propofol (N/A, 10/08/2017); and Cardioversion (N/A, 05/15/2019). Family: family history includes Arrhythmia in his brother and sister; Cancer in his father; Heart disease in his father; Hypertension in his father; Stroke in his father.  Laboratory Chemistry Profile   Renal Lab Results  Component Value Date   BUN 17 07/21/2020   CREATININE 1.21 82/99/3716   BCR NOT APPLICABLE 96/78/9381   GFRAA 72 07/21/2020   GFRNONAA 62 07/21/2020    Hepatic Lab Results  Component Value Date   AST 15 07/21/2020   ALT 11 07/21/2020   ALBUMIN 4.6 08/01/2019   ALKPHOS 72 08/01/2019   LIPASE 28 03/13/2017    Electrolytes Lab Results  Component Value Date   NA 143 07/21/2020   K 4.2 07/21/2020   CL 108 07/21/2020   CALCIUM 9.0 07/21/2020   MG 2.0 02/01/2021    Bone Lab Results  Component Value Date   25OHVITD1 32 02/01/2021   25OHVITD2 <1.0 02/01/2021   25OHVITD3 32 02/01/2021    Inflammation (CRP: Acute Phase) (ESR: Chronic Phase) No results found for: CRP, ESRSEDRATE, LATICACIDVEN       Note: Above Lab results reviewed.  Recent Imaging Review  MR LUMBAR SPINE W WO CONTRAST CLINICAL DATA:  Low back pain for over 6 weeks.  EXAM: MRI LUMBAR SPINE WITHOUT AND WITH CONTRAST  TECHNIQUE: Multiplanar and multiecho pulse sequences of the lumbar spine were obtained without and with intravenous contrast.  CONTRAST:  33m GADAVIST GADOBUTROL 1 MMOL/ML IV SOLN  COMPARISON:  CT abdomen 03/13/2017  FINDINGS: Segmentation:  Standard.  Alignment: Grade 1 anterolisthesis of L5 on S1 secondary to bilateral L5 pars interarticularis defects. Minimal retrolisthesis of L2 on L3 and L4 on L5.  Vertebrae: No acute fracture, evidence of discitis, or aggressive bone lesion.  Hemangiomas scattered throughout the lumbosacral spine. 10 mm sclerotic bone lesion in the left lateral aspect of the L2 vertebral body and a 7 mm sclerotic bone lesion in the S2 vertebral body which may reflect small bone islands versus less likely metastatic disease given the appearance on the prior CT dated 03/13/2017 and no significant interval change.  Conus medullaris and cauda equina: Conus extends to the L1 level. Conus and cauda equina appear normal.  Paraspinal and other soft tissues: No acute paraspinal abnormality.  Disc levels:  Disc spaces: Disc desiccation throughout the lumbar spine. Disc height loss at L1-2 with reactive endplate changes. Disc height loss at L4-5.  T12-L1: Mild broad-based disc bulge. No foraminal or central canal stenosis.  L1-L2: Broad-based disc osteophyte complex with a prominent right lateral component. Mild bilateral facet arthropathy. Mild spinal stenosis. Moderate right foraminal stenosis. No left foraminal stenosis.  L2-L3: Mild broad-based disc bulge. Mild spinal stenosis. Mild bilateral facet arthropathy. Mild left foraminal stenosis. No right foraminal stenosis.  L3-L4:  Mild broad-based disc bulge. Mild bilateral facet arthropathy. No foraminal or central canal stenosis.  L4-L5: Mild broad-based disc bulge. Mild bilateral facet arthropathy. 4 mm left facet posterior extra-spinal synovial cyst. Moderate left and mild right foraminal stenosis. No spinal stenosis.  L5-S1: Broad-based disc bulge. Mild bilateral facet arthropathy. Moderate bilateral foraminal stenosis. No spinal stenosis.  IMPRESSION: 1. Diffuse lumbar spine spondylosis as described above. 2. No acute osseous injury of the lumbar spine. 3. No aggressive osseous lesions to suggest metastatic disease.  Electronically Signed   By: Kathreen Devoid M.D.   On: 05/22/2021 09:56 Note: Reviewed        Physical Exam  General appearance: Well nourished, well developed, and  well hydrated. In no apparent acute distress Mental status: Alert, oriented x 3 (person, place, & time)       Respiratory: No evidence of acute respiratory distress Eyes: PERLA Vitals: BP (!) 154/89   Pulse (!) 56   Temp 97.9 F (36.6 C)   Resp 18   Ht 5' 11"  (1.803 m)   Wt (!) 324 lb (147 kg)   SpO2 99%   BMI 45.19 kg/m  BMI: Estimated body mass index is 45.19 kg/m as calculated from the following:   Height as of this encounter: 5' 11"  (1.803 m).   Weight as of this encounter: 324 lb (147 kg). Ideal: Ideal body weight: 75.3 kg (166 lb 0.1 oz) Adjusted ideal body weight: 104 kg (229 lb 3.3 oz)  Right Shoulder Exam    Tenderness  The patient is experiencing tenderness in the acromion.   Range of Motion  Active abduction:  abnormal  Passive abduction:  abnormal  Extension:  abnormal  Internal rotation 0 degrees:  abnormal  Internal rotation 90 degrees:  abnormal    Muscle Strength  The patient has normal right shoulder strength.   Other  Erythema: absent Scars: absent Sensation: normal Pulse: present  Right shoulder arthropathic pain  Right ring trigger finger  Assessment   Diagnosis Status  1. Localized osteoarthritis of shoulder regions, bilateral   2. Trigger finger of right hand, unspecified finger   3. Chronic pain of both shoulders   4. Chronic pain syndrome    Having a Flare-up Having a Flare-up Having a Flare-up   Updated Problems: No problems updated.  Plan of Care  Problem-specific:  No problem-specific Assessment & Plan notes found for this encounter.  Mr. CALEN GEISTER has a current medication list which includes the following long-term medication(s): amiodarone, amlodipine, benazepril, duloxetine, fluticasone, furosemide, metoprolol succinate, xarelto, and trazodone.  Pharmacotherapy (Medications Ordered): Meds ordered this encounter  Medications   HYDROcodone-acetaminophen (NORCO) 7.5-325 MG tablet    Sig: Take 1 tablet by mouth 3  (three) times daily as needed for severe pain. Must last 30 days    Dispense:  90 tablet    Refill:  0    Chronic Pain: STOP Act (Not applicable) Fill 1 day early if closed on refill date. Avoid benzodiazepines within 8 hours of opioids   HYDROcodone-acetaminophen (NORCO) 7.5-325 MG tablet    Sig: Take 1 tablet by mouth 3 (three) times daily as needed for severe pain. Must last 30 days    Dispense:  90 tablet    Refill:  0    Chronic Pain: STOP Act (Not applicable) Fill 1 day early if closed on refill date. Avoid benzodiazepines within 8 hours of opioids   Orders:  Orders Placed This Encounter  Procedures   SHOULDER INJECTION  Standing Status:   Future    Standing Expiration Date:   08/16/2021    Scheduling Instructions:     Right shoulder anterior    Order Specific Question:   Where will this procedure be performed?    Answer:   [facility]   Injection tendon or ligament    Standing Status:   Future    Standing Expiration Date:   09/16/2021    Scheduling Instructions:     Right trigger finger   Follow-up plan:   Return in about 1 week (around 06/23/2021) for R shoulder injection and right trigger finger, in clinic [facility].    Recent Visits Date Type Provider Dept  05/23/21 Office Visit Gillis Santa, MD [facility]  05/12/21 Office Visit Gillis Santa, MD [facility]  04/19/21 Office Visit Gillis Santa, MD [facility]  04/11/21 Procedure visit Gillis Santa, MD [facility]  03/29/21 Office Visit Gillis Santa, MD [facility]  Showing recent visits within past 90 days and meeting all other requirements Today's Visits Date Type Provider Dept  06/16/21 Office Visit Gillis Santa, MD [facility]  Showing today's visits and meeting all other requirements Future Appointments Date Type Provider Dept  06/27/21 Appointment Gillis Santa, MD [facility]  08/23/21 Appointment Gillis Santa, MD [facility]  Showing future appointments within next 90 days and meeting all other requirements  I discussed the assessment and treatment plan with the patient. The patient was provided an opportunity to ask questions and all were answered. The patient agreed with the plan and demonstrated an understanding of the instructions.  Patient advised to call back or seek an in-person evaluation if the symptoms or condition worsens.  Duration of encounter: 43mnutes.  Note by: BGillis Santa MD Date: 06/16/2021; Time: 11:06 AM

## 2021-06-16 NOTE — Therapy (Incomplete)
OUTPATIENT PHYSICAL THERAPY SHOULDER/BACK TREATMENT   Patient Name: [name] MRN: [id number] DOB:[birth year], 66 y.o., male Today's Date: 06/16/2021      Past Medical History:  Diagnosis Date   Cardiomyopathy (in setting of Afib)    a. 12/2013 Echo: EF 45-50%, mild ant and antsept HK. mild MR. Mod dil LA. nl RV fxn. Rhythm was Afib.   DJD (degenerative joint disease) of knee    History of kidney stones    Intervertebral disc disorder with radiculopathy of lumbosacral region    Kidney stone 2012   Lower extremity edema    PAF (paroxysmal atrial fibrillation) (HCC)    Pernicious anemia    Prostate cancer ([place])    Pulmonary nodule, right    a. 10/2014 Cardiac CTA: 68m RLL nodule; b. 04/2015 CT Chest: stable 741mRLL nodule. No new nodules; 02/2017 CTA Chest: stable, benign, 31m23mLL pulm nodule.   [facility] [place]  Past Surgical History:  Procedure Laterality Date   CARDIOVERSION N/A 05/15/2019   Procedure: CARDIOVERSION;  Surgeon: GolMinna MerrittsD;  Location: [facility] ORS;  Service: Cardiovascular;  Laterality: N/A;   CARPAL TUNNEL RELEASE Right 12/23/2014   Procedure: CARPAL TUNNEL RELEASE;  Surgeon: HowEarnestine LeysD;  Location: [facility] ORS;  Service: Orthopedics;  Laterality: Right;   CARPAL TUNNEL RELEASE Left 01/06/2015   Procedure: CARPAL TUNNEL RELEASE;  Surgeon: HowEarnestine LeysD;  Location: [facility] ORS;  Service: Orthopedics;  Laterality: Left;   COLONOSCOPY WITH PROPOFOL N/A 10/08/2017   Procedure: COLONOSCOPY WITH PROPOFOL;  Surgeon: VanLin LandsmanD;  [facility];  Service: Gastroenterology;  Laterality: N/A;   GASTRIC BYPASS  2002   KNEE SURGERY     knee trauma   prostate seeding     Patient Active Problem List   Diagnosis Date Noted   Lesion of bone of lumbosacral spine (L5) 05/12/2021   Localized osteoarthritis of shoulder regions, bilateral 03/29/2021   Chronic pain of both shoulders 03/29/2021   Drug-induced myopathy  02/21/2021   Trigger finger of right hand 11/15/2020   Prostate cancer ([facility]/11/2020   Insomnia 08/01/2019   Primary osteoarthritis of both wrists 02/17/2019   Trigger middle finger of left hand 02/17/2019   Chronic pain syndrome 02/17/2019   Chronic radicular lumbar pain 10/08/2018   Lumbar radiculopathy 10/08/2018   Lumbar degenerative disc disease 10/08/2018   Lumbar facet arthropathy 10/08/2018   Lumbar facet joint syndrome 10/08/2018   Intervertebral disc disorder with radiculopathy of lumbosacral region    Pernicious anemia 05/22/2016   Paroxysmal atrial fibrillation ([facility]2/04/2015   DJD (degenerative joint disease) of knee 10/27/2014   Bilateral carpal tunnel syndrome 10/27/2014   Morbid obesity with BMI of 50.0-59.9, adult ([facility]0/11/2014   Mixed hyperlipidemia 10/27/2014   H/O gastric bypass 03/20/2014   Hyperkalemia 03/20/2014   History of prostate cancer 12/26/2013   Essential hypertension 12/26/2013   Encounter for anticoagulation discussion and counseling 12/26/2013    PCP: KarOlin HauserO  REFERRING PROVIDER: KarNobie Putnam REFERRING DIAGNOSIS: M19.011,M19.012 (ICD-10-CM) - Localized osteoarthritis of shoulder regions, bilateral, M65.30 (ICD-10-CM) - Trigger finger of right hand, unspecified finger, M25.511,G89.29,M25.512 (ICD-10-CM) - Chronic pain of both shoulders, M53.3 (ICD-10-CM) - Sacroiliac joint pain, G89.4 (ICD-10-CM) - Chronic pain syndrome  THERAPY DIAG: Other low back pain  Chronic left shoulder pain  Chronic right shoulder pain  Muscle weakness (generalized)  ONSET DATE: 04/30/19  FOLLOW UP APPT WITH PROVIDER: Yes    From initial evaluation (  04/29/21)  SUBJECTIVE:                                                                                                                                                                                         Chief Complaint: Bilateral shoulder pain  Pertinent History [name] is a 66 y.o. year old, male patient referred to physical therapy by pain management for bilateral shoulder pain. Pt reports chronic bilateral shoulder pain for approximately the last 2 years. The pain started in his R wrist which improved after an injection however the pain gradually started worsening and then moving up his R arm to his R shoulder. Eventually he started having pain in his L shoulder as well. He noticed the most significant pain when lifting his arms to wash under them while bathing. He had injections in both shoulders on March 27th with significant improvement in pain since that time. At this point pt states that he is not having any shoulder pain. He has a past medical history of Cardiomyopathy (in setting of Afib), DJD (degenerative joint disease) of knee, History of kidney stones, Intervertebral disc disorder with radiculopathy of lumbosacral region, Kidney stone (2012), Lower extremity edema, PAF (paroxysmal atrial fibrillation) ([place]), Pernicious anemia, Prostate cancer ([place]), Pulmonary nodule, right, and SVT (supraventricular tachycardia) ([place]). He also  has a past surgical history that includes Knee surgery; Gastric bypass (2002); prostate seeding; Carpal tunnel release (Right, 12/23/2014); Carpal tunnel release (Left, 01/06/2015); Colonoscopy with propofol (N/A, 10/08/2017); and Cardioversion (N/A, 05/15/2019). He has tapered off a lot of his medications over the last few months because he felt like a lot of his symptoms were coming from medication side effects. He has also noted that he started having anxiety attacks since January which is new for him. Pt and his wife joined [employer] and he recently ordered an upright walker with forearm supports to help his mobility. Pt reports losing 41# in the last 2 years secondary to drop in his calorie intake.    Pain:  Pain Intensity: Present: 0/10, Best: 0/10, Worst: 0/10 (not currently hurting) Pain location: Bilateral  shoulders Pain Quality: sharp and aching  Radiating: No  Numbness/Tingling: Yes, bilateral hand numbness occasionally as well as bilateral foot numbness.  Focal Weakness: No, however pt has noticed weakness in both shoulders and hands. Aggravating factors: Lifting arms; Relieving factors: injections, pain medication, no benefit with heat/topical creams, unable to take NSAIDs secondary to taking Xarelto 24-hour pain behavior: Constant pain all the time (not currently); History of prior shoulder or neck injury, pain, surgery, or therapy: No Falls: Has patient fallen in last 6 months? No,  Dominant hand: right Imaging: Yes, R shoulder radiographs (03/29/21): Mild glenohumeral and mild acromioclavicular osteoarthritis; L shoulder radiographs (03/29/21):  Mild-to-moderate glenohumeral and mild acromioclavicular osteoarthritis. 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 Otherwise negative  Precautions: None  Weight Bearing Restrictions: No  Living Environment Lives with: lives with their spouse Lives in: House/apartment  Patient Goals: Improve shoulder strength and prevent pain from returning. Pt would also like to address his back pain/sciatica;   OBJECTIVE:   Patient Surveys  FOTO: 23, predicted improvement to 56  Cognition Patient is oriented to person, place, and time.  Recent memory is intact.  Remote memory is intact.  Attention span and concentration are intact.  Expressive speech is intact.  Patient's fund of knowledge is within normal limits for educational level.   Gross Musculoskeletal  Assessment Tremor: None Bulk: Normal Tone: Normal  Gait Deferred  Posture Forward head and rounded shoulders in both sitting and standing;  Cervical Screen AROM: WFL and painless with overpressure in all planes Spurlings A (ipsilateral lateral flexion/axial compression): R: Negative L: Negative Spurlings B (ipsilateral lateral flexion/contralateral rotation/axial compression): R: Negative L: Negative Repeated movement: Not tested Hoffman Sign (cervical cord compression): R: Positive L: Negative ULTT Median: R: Not examined L: Not examined ULTT Ulnar: R: Not examined L: Not examined ULTT Radial: R: Not examined L: Not examined   AROM  AROM (Normal range in degrees) AROM 06/16/2021  Cervical  Flexion (50) WNL  Extension (80) WNL  Right lateral flexion (45) WNL  Left lateral flexion (45) WNL  Right rotation (85) WNL  Left rotation (85) WNL     Right Left  Shoulder    Flexion 177 170  Extension    Abduction 155 146  External Rotation 90 90  Internal Rotation 70 70    Elbow    Flexion WNL WNL  Extension WNL WNL  Pronation WNL WNL  Supination WNL WNL  (* = pain; Blank rows = not tested)    LE MMT:  MMT (out of 5) Right 06/16/2021 Left 06/16/2021  Cervical (isometric)  Flexion   Extension   Lateral Flexion    Rotation        Shoulder   Flexion 4+ 5  Extension    Abduction 5 5*  External rotation (seated) 5 5  Internal rotation (seated) 5 5  Horizontal abduction    Horizontal adduction    Lower Trapezius    Rhomboids        Elbow  Flexion 5 5  Extension 5 5  Pronation 5 5  Supination 5 5      Wrist  Flexion 5 5  Extension 5 5  Radial deviation 5 5  Ulnar deviation 5 5      MCP  Flexion    Extension    Abduction 5 5  Adduction 5 5  (* = pain; Blank rows = not tested)  Sensation Deferred  Reflexes Deferred   Palpation No pain with palpation to anterior, lateral, and posterior shoulders bilaterally;   Passive Accessory  Intervertebral Motion Deferred    Accessory Motions/Glides Glenohumeral: Posterior: R: abnormal (hypomobile) L: abnormal (hypomobile) Inferior: R: not examined L: not examined Anterior: R: not examined L: not examined  Acromioclavicular:  Posterior: R: not examined L: not examined Anterior: R: not examined L: not examined  Sternoclavicular: Posterior: R: not examined L: not examined Anterior: R: not examined L: not examined Superior: R: not examined L: not examined Inferior: R: not examined L: not examined  Scapulothoracic: Distraction: R: not examined L: not examined Medial: R: not examined L: not examined Lateral: R: not examined L: not examined Inferior: R: not examined L: not examined Superior: R: not examined L: not examined  Muscle Length Testing Deferred    SPECIAL TESTS  Rotator Cuff  Drop Arm Test: Negative Painful Arc (Pain from 60 to 120 degrees scaption): Negative Infraspinatus Muscle Test: Negative  Subacromial Impingement Hawkins-Kennedy: R: Negative L: Negative Neer (Block scapula, PROM flexion): R: Negative L: Negative Painful Arc (Pain from 60 to 120 degrees scaption): R: Negative L: Negative Empty Can: R: Not tested L: Not tested External Rotation Resistance: R: Negative L: Negative Horizontal Adduction: Not examined Scapular Assist: R: Not tested L: Not tested  Labral Tear Biceps Load II (120 elevation, full ER, 90 elbow flexion, full supination, resisted elbow flexion): R: Negative L: Negative Crank (160 scaption, axial load with IR/ER): R: Positive L: Negative Active Compression Test: R: Not tested L: Not tested  Bicep Tendon Pathology Speed (shoulder flexion to 90, external rotation, full elbow extension, and forearm supination with resistance: Not examined Yergason's (resisted shoulder ER and supination/biceps tendon pathology): R: Positive L: Positive  Shoulder Instability Sulcus Sign: R: Not tested L: Not tested Anterior  Apprehension: R: Negative L: Negative  Beighton scale: Deferred   Back Assessment 05/04/21  Pertinent Back Pain History Mr. EMILIO BAYLOCK is a 66 y.o. year old, male patient referred to physical therapy by pain management for bilateral shoulder pain. He also reports a history of chronic low back pain. He has had intermittent occasional acute flares of back pain for decades. However pain worsened around 2017 when he noticed LLE sciatica and started having LLE weakness/buckling. He saw a chiropractor in [place] who thought it was better to send him for [facility] which significantly helped the pain. He traveled to [place] and while he was there his pain worsened. When he returned he had another ESI which improved his pain however it eventually returned.  For the last two years that he was working he could hardly walk due to the pain. He saw Dr. Sabra Heck (ortho) who referred him to Dr. Holley Raring with pain management. Pt has had lumbar injections and the pain improves with injections however it return after about a month. He had a radiofrequency ablation for his L lumbar spine in early February and R side of his lumbar spine in late February. Once he had the procedure on the R side he had complete resolution of his pain however it has gradually returned on the L side. Pain radiates into his L buttock down to the L knee. He is currently on a course prednisone.   Pain:  Pain Intensity: Present: 2/10, Best: 0/10, Worst: 10/10 Pain location: Central low back spreading out to the left side; Pain Quality: aching, deep Radiating: Yes, into the L buttock and down the L posterior thigh to the knee  Numbness/Tingling: Yes, numbness/tingling into L calf and in L foot Focal Weakness: Yes, occasional feeling of bilateral LE weakness but not as bad as it had been previously Aggravating factors: walking, sometimes starts spontaneously, extended sitting in the evening,  Relieving factors: pain medications (takes infrequently),  sometimes laying down helps, heat (pt unsure if its  the heat or laying down that actually helps) 24-hour pain behavior: Better in the AM, worse as the day progresses; Imaging: Lumbar radiograph 12/08/20: FINDINGS: 13 mm anterolisthesis L5 on S1. 5 mm retrolisthesis L4 on L5. 7 mm retrolisthesis L2 on L3. Vertebral body heights are maintained. There is mild scoliosis of the lumbar spine. Multilevel degenerative change with marked disc space narrowing and osteophyte at L4-L5, mild degenerative changes at T12-L1, L1-L2 and L2-L3. Facet degenerative changes at multiple levels. Aortic atherosclerosis. With flexion, grossly stable 13 mm anterolisthesis L5 on S1 and retrolisthesis L4 on L5. Retrolisthesis at L2-L3 slightly diminishes to 4 mm. With extension, similar 13 mm anterolisthesis L5 on S1 and similar retrolisthesis L4 on L5. 8 mm retrolisthesis L2 on L3.  Lumbar MRI 09/12/18: (see entire report however significant findings below): Baseline narrowing of the vertebral canal due to congenitally short pedicles, degenerative changes thorughout the lumbar spine including moderate to severe left neuroforaminal narrowing at L4-5, bilateral mildly diastatic pars defects trace anterolisthesis L5 on S1, mild R facet edema at L4-5 and mild periarticular soft tissue edema at L4-5 bilaterally, mild R facet edema at L5-S1, sclerotic lesion in the L2 vertebral body and the left aspect of the S2 body. Given the patient's history of prostate cancer, sequela of prostate metastasis cannot be excluded.  History of prior back injury, pain, surgery, or therapy: Yes, Nicole Kindred PT years ago for back pain, has had chiropractic care as well  Red flags: History of prostate cancer, progressive weight loss over the last couple years. Denies bowel/bladder changes, saddle paresthesia, h/o spinal tumors, h/o compression fx, h/o abdominal aneurysm, abdominal pain, chills/fever, night sweats, nausea, vomiting;  Precautions: None  Weight  Bearing Restrictions: No   Patient Goals: Decrease back pain and improve his ability to be active and exercise;   OBJECTIVE:   Cognition Patient is oriented to person, place, and time.  Recent memory is intact.  Remote memory is intact.  Attention span and concentration are intact.  Expressive speech is intact.  Patient's fund of knowledge is within normal limits for educational level.     Gross Musculoskeletal Assessment Tremor: None Bulk: Normal Tone: Normal No visible step-off along spinal column;  GAIT: Deferred  Posture: Lumbar lordosis: WNL Iliac crest height: Equal bilaterally Lumbar lateral shift: Negative History of scoliosis reported on imaging but not evident during physical exam today  AROM  AROM (Normal range in degrees) AROM  06/16/2021  Lumbar   Flexion (65) WNL  Extension (30) Mild deficit  Right lateral flexion (25) Mild deficit  Left lateral flexion (25) Mild deficit  Right rotation (30) Mild deficit  Left rotation (30) Mild deficit      Hip Right Left  Flexion (125) WNL WNL  Extension (15) Moderate limitation Moderate limitation  Abduction (40)    Adduction     Internal Rotation (45) >30 degrees >30 degrees  External Rotation (45) >30 degrees >30 degrees       Knee    Flexion (135) WNL WNL  Extension (0) WNL WNL      Ankle    Dorsiflexion (20)    Plantarflexion (50)    Inversion (35)    Eversion (15    (* = pain; Blank rows = not tested)   LE MMT:  MMT (out of 5) Right 06/16/2021 Left 06/16/2021  Hip flexion 5 5  Hip extension    Hip abduction (seated) 5 5  Hip adduction (seated) 5 5  Hip internal rotation 5 5  Hip external rotation 5 5  Knee flexion 5 5  Knee extension 5 5  Ankle dorsiflexion 5 5  Ankle plantarflexion    Ankle inversion    Ankle eversion    (* = pain; Blank rows = not tested)   Sensation Grossly intact to light touch bilateral LEs as determined by testing dermatomes L2-S2. Proprioception, and hot/cold  testing deferred on this date.   Reflexes Deferred   Muscle Length Hamstrings: R: Positive for limitation around 70 degrees L: Positive for limitation around 70 degrees Ely (quadriceps): R: Not examined L: Not examined Thomas (hip flexors): R: Not examined L: Not examined Ober: R: Not examined L: Not examined   Palpation  Location LEFT  RIGHT           Lumbar paraspinals 1 1  Quadratus Lumborum    Gluteus Maximus 0 0  Gluteus Medius 0 0  Deep hip external rotators 1 0  PSIS 0 0  Fortin's Area (SIJ) 0 0  Greater Trochanter 0 0  (Blank rows = not tested) Graded on 0-4 scale (0 = no pain, 1 = pain, 2 = pain with wincing/grimacing/flinching, 3 = pain with withdrawal, 4 = unwilling to allow palpation), (Blank rows = not tested)   Passive Accessory Intervertebral Motion Pt reports mild back pain with CPA L3-L5 and UPA bilaterally L3-L5. Generally, hypomobile throughout    SPECIAL TESTS Lumbar Radiculopathy and Discogenic: Centralization and Peripheralization (SN 92, -LR 0.12): Not examined Slump (SN 83, -LR 0.32): R: Negative L: Negative SLR (SN 92, -LR 0.29): R: Negative L:  Negative Crossed SLR (SP 90): R: Negative L: Negative  Facet Joint: Extension-Rotation (SN 100, -LR 0.0): R: Negative L: Negative  Lumbar Foraminal Stenosis: Lumbar quadrant (SN 70): R: Negative L: Negative  Hip: FABER (SN 81): R: Negative L: Negative FADIR (SN 94): R: Negative L: Negative Hip scour (SN 50): R: Negative L: Negative  SIJ:  Thigh Thrust (SN 88, -LR 0.18) : R: Not examined L: Not examined  Piriformis Syndrome: FAIR Test (SN 88, SP 83): R: Not examined L: Not examined  Functional Tasks Deferred  Beighton scale: Deferred   TODAY'S TREATMENT  SUBJECTIVE: Pt reports that he has been having a lot of pain over the course of the last week.  He rates his low back pain as 5/10 and shoulder pain as 3/10 upon arrival today. He scheduled a follow-up appointment with pain management  (Dr. Holley Raring). He saw urology who ordered labwork but otherwise was not concerned about his MRI findings. No specific questions currently.  PAIN: 3/10 shoulder pain, 5/10 back pain   Ther-ex  NuStep (seat 13, arms 11) L2 BUE/BLE x 5 minutes for warm-up during interval history  Seated LAQ with 5# ankle weight (AW) x 15 BLE, deferred second set secondary to R knee pain; Seated chest press with cane and manual resistance from therapist 2 x 20; Seated rows with cane and manual resistance from therapist 2 x 20; Seated shoulder flexion 4# dumbbell (DB) in LUE, 1# DB in RUE 2 x 15 BUE; Seated shoulder scaption 4# dumbbell (DB) in LUE, 1# DB in RUE 2 x 15 BUE; Seated overhead shoulder press with 4# DB in LUE, 1# DB in RUE x 14; Seated bicep curls with 4# DB in LUE, 1# DB in RUE x 20 BUE; Seated clams with blue tband x 15, x 20  BLE; Seated adductor ball squeeze 2 x 20 BLE;  Seated marches with 5# AW 2 x 20 BLE;  Not performed today: Double leg heel raises 2 x 10 Seated Nautilus rows 30# x 10, 40# 2 x 10; Seated Nautilus chest press 40# 3 x 10; Seated Pallof press with green tband 2 x 10 toward each side; Supine (incline) SLR 2 x 10 RLE, x 12/x 10 LLE; Supine (incline) partial bridges 2 x 12; Seated shoulder "Ws" with green tband resistance x 10; Seated shoulder IR with green tband resistance x 10, x 15 RLE, 2 x 15 LUE; Mini squats 2 x 10; Standing exercises with 3# ankle weights: Hip flexion marches 2 x 10 BLE; Hip abduction 2 x 10 BLE; Hamstring curls 2 x 10 BLE; Hip extension 2 x 10 BLE;   PATIENT EDUCATION:  Education details: Pt educated throughout session about proper posture and technique with exercises. Improved exercise technique, movement at target joints, use of target muscles after min to mod verbal, visual, tactile cues Person educated: Patient Education method: Explanation Education comprehension: verbalized understanding and returned demonstration;   HOME EXERCISE  PROGRAM: Access Code: [email or web address] URL: [email or web address] Date: 06/08/2021 Prepared by: Roxana Hires  Exercises - Single Arm Shoulder Flexion with Dumbbell  - 1 x daily - 3 x weekly - 2-3 sets - 10 reps - 2s hold - Single Arm Scaption with Dumbbell  - 1 x daily - 3 x weekly - 2-3 sets - 10 reps - 2s hold - Seated Shoulder Row with Anchored Resistance  - 1 x daily - 3 x weekly - 2-3 sets - 10 reps - 2s hold - Shoulder W - External Rotation with Resistance  - 1 x daily - 3 x weekly - 2-3 sets - 10 reps - 2s hold - Supine Active Straight Leg Raise  - 1 x daily - 7 x weekly - 2-3 sets - 10 reps - 2s hold - Seated March with Resistance  - 1 x daily - 7 x weekly - 2-3 sets - 10 reps - 2s hold - Seated Hip Abduction with Resistance  - 1 x daily - 7 x weekly - 2-3 sets - 10 reps - 2s hold - Seated Hip Adduction Isometrics with Ball  - 1 x daily - 7 x weekly - 2-3 sets - 10 reps - 2s hold - Mini Squat with Counter Support  - 1 x daily - 7 x weekly - 2-3 sets - 10 reps - 2s hold   ASSESSMENT:  CLINICAL IMPRESSION:  Continued upper quarter strengthening during session today as well as hip and abdominal exercises. Pt reports appropriate muscle fatigue and demonstrates excellent effort. Continued with 4# dumbbell in LUE and 1# dumbbell in RUE due to recent worsening of shoulder pain. Pt has an appointment to follow-up with pain management and discuss his worsening shoulder pain, especially the R side since the acute injury. Pt encouraged to follow-up with PT as scheduled. He will benefit from PT services to address deficits in strength and pain in order to return to full function at home and with leisure activities.    REHAB POTENTIAL: Good  CLINICAL DECISION MAKING: Stable/uncomplicated  EVALUATION COMPLEXITY: Low   GOALS: Goals reviewed with patient? No  SHORT TERM GOALS: Target date: 07/28/2021  Pt will be independent with HEP to improve strength and decrease shoulder and back  pain to improve pain-free function at home and work. Baseline:  Goal status: ONGOING   LONG TERM GOALS: Target date: 07/22/2021  Pt will increase shoulder FOTO to at least 62 to demonstrate significant improvement in function at home and  work related to shoulder pain  Baseline: 04/29/21: 50, 05/30/21: 57 Goal status: PARTIALLY MET  2.  Pt will report at least 75% improvement in shoulder symptoms with all activities and feel confident with gym-based exercise program in order to maintain pain-free shoulder function. Baseline: 05/30/21: Pt reports minimal shoulder pain since starting therapy until this weekend with new injury Goal status: ACHIEVED  3. Pt will decrease mODI scoreby at least 13 points in order demonstrate clinically significant reduction in pain/disability related to his back pain Baseline: 05/30/21: 72% Goal status: INITIAL  4. Pt will decrease worst back pain as reported on NPRS by at least 2 points in order to demonstrate clinically significant reduction in back pain.  Baseline: 05/30/21: worst: 10/10 Goal status: INITIAL    PLAN: PT FREQUENCY: 2x/week  PT DURATION: 12 weeks  PLANNED INTERVENTIONS: Therapeutic exercises, Therapeutic activity, Neuromuscular re-education, Balance training, Gait training, Patient/Family education, Joint mobilization, Vestibular training, Canalith repositioning, Aquatic Therapy, Dry Needling, Cognitive remediation, Electrical stimulation, Spinal manipulation, Spinal mobilization, Cryotherapy, Moist heat, Traction, Ultrasound, Ionotophoresis 6m/ml Dexamethasone, and Manual therapy  PLAN FOR NEXT SESSION: Continue strengthening for shoulders and back, review and modify HEP as needed,  JLyndel Safe PT, DPT, [facility]  , 06/16/2021, 11:26 AM

## 2021-06-16 NOTE — Progress Notes (Signed)
Nursing Pain Medication Assessment:  Safety precautions to be maintained throughout the outpatient stay will include: orient to surroundings, keep bed in low position, maintain call bell within reach at all times, provide assistance with transfer out of bed and ambulation.  Medication Inspection Compliance: Pill count conducted under aseptic conditions, in front of the patient. Neither the pills nor the bottle was removed from the patient's sight at any time. Once count was completed pills were immediately returned to the patient in their original bottle.  Medication: Hydrocodone/APAP Pill/Patch Count:  14 of 90 pills remain Pill/Patch Appearance: Markings consistent with prescribed medication Bottle Appearance: Standard pharmacy container. Clearly labeled. Filled Date: 04 / 04 / 2023 Last Medication intake:  Yesterday

## 2021-06-16 NOTE — Patient Instructions (Addendum)
Dr Holley Raring does not require him to stop blood thinner for this procedure per Dr Holley Raring. Patient does not want sedation.  No instructions given.

## 2021-06-17 DIAGNOSIS — G8929 Other chronic pain: Secondary | ICD-10-CM

## 2021-06-17 DIAGNOSIS — M6281 Muscle weakness (generalized): Secondary | ICD-10-CM

## 2021-06-17 DIAGNOSIS — M5459 Other low back pain: Secondary | ICD-10-CM

## 2021-06-21 ENCOUNTER — Ambulatory Visit: Payer: Medicare Other | Attending: Student in an Organized Health Care Education/Training Program

## 2021-06-21 DIAGNOSIS — G8929 Other chronic pain: Secondary | ICD-10-CM | POA: Diagnosis not present

## 2021-06-21 DIAGNOSIS — M25512 Pain in left shoulder: Secondary | ICD-10-CM | POA: Diagnosis not present

## 2021-06-21 DIAGNOSIS — M6281 Muscle weakness (generalized): Secondary | ICD-10-CM | POA: Insufficient documentation

## 2021-06-21 DIAGNOSIS — M5459 Other low back pain: Secondary | ICD-10-CM | POA: Diagnosis not present

## 2021-06-21 DIAGNOSIS — M25511 Pain in right shoulder: Secondary | ICD-10-CM | POA: Insufficient documentation

## 2021-06-21 NOTE — Therapy (Signed)
OUTPATIENT PHYSICAL THERAPY SHOULDER/BACK TREATMENT   Patient Name: [name] MRN: [id number] DOB:19-Apr-1955, 66 y.o., male Today's Date: 06/22/2021   PT End of Session - 06/21/21 1704     Visit Number 15    Number of Visits 25    Date for PT Re-Evaluation 07/22/21    Authorization Type eval: 04/29/21    PT Start Time 1701    PT Stop Time 1745    PT Time Calculation (min) 44 min    Activity Tolerance Patient tolerated treatment well    Behavior During Therapy [facility] for tasks assessed/performed               Past Medical History:  Diagnosis Date   Cardiomyopathy (in setting of Afib)    a. 12/2013 Echo: EF 45-50%, mild ant and antsept HK. mild MR. Mod dil LA. nl RV fxn. Rhythm was Afib.   DJD (degenerative joint disease) of knee    History of kidney stones    Intervertebral disc disorder with radiculopathy of lumbosacral region    Kidney stone 2012   Lower extremity edema    PAF (paroxysmal atrial fibrillation) (HCC)    Pernicious anemia    Prostate cancer ([place])    Pulmonary nodule, right    a. 10/2014 Cardiac CTA: 48m RLL nodule; b. 04/2015 CT Chest: stable 711mRLL nodule. No new nodules; 02/2017 CTA Chest: stable, benign, 68m51mLL pulm nodule.   [facility]  Past Surgical History:  Procedure Laterality Date   CARDIOVERSION N/A 05/15/2019   Procedure: CARDIOVERSION;  Surgeon: Wayne MerrittsD;  Location: [facility] ORS;  Service: Cardiovascular;  Laterality: N/A;   CARPAL TUNNEL RELEASE Right 12/23/2014   Procedure: CARPAL TUNNEL RELEASE;  Surgeon: Wayne LeysD;  Location: [facility] ORS;  Service: Orthopedics;  Laterality: Right;   CARPAL TUNNEL RELEASE Left 01/06/2015   Procedure: CARPAL TUNNEL RELEASE;  Surgeon: Wayne LeysD;  Location: [facility] ORS;  Service: Orthopedics;  Laterality: Left;   COLONOSCOPY WITH PROPOFOL N/A 10/08/2017   Procedure: COLONOSCOPY WITH PROPOFOL;  Surgeon: Wayne LandsmanD;  [facility];   Service: Gastroenterology;  Laterality: N/A;   GASTRIC BYPASS  2002   KNEE SURGERY     knee trauma   prostate seeding     Patient Active Problem List   Diagnosis Date Noted   Lesion of bone of lumbosacral spine (L5) 05/12/2021   Localized osteoarthritis of shoulder regions, bilateral 03/29/2021   Chronic pain of both shoulders 03/29/2021   Drug-induced myopathy 02/21/2021   Trigger finger of right hand 11/15/2020   [place]   Insomnia 08/01/2019   Primary osteoarthritis of both wrists 02/17/2019   Trigger middle finger of left hand 02/17/2019   Chronic pain syndrome 02/17/2019   Chronic radicular lumbar pain 10/08/2018   Lumbar radiculopathy 10/08/2018   Lumbar degenerative disc disease 10/08/2018   Lumbar facet arthropathy 10/08/2018   Lumbar facet joint syndrome 10/08/2018   Intervertebral disc disorder with radiculopathy of lumbosacral region    Pernicious anemia 05/22/2016   Paroxysmal atrial fibrillation ([place]2/04/2015   DJD (degenerative joint disease) of knee 10/27/2014   Bilateral carpal tunnel syndrome 10/27/2014   Morbid obesity with BMI of 50.0-59.9, adult ([place]CC[place]0/11/2014   Mixed hyperlipidemia 10/27/2014   H/O gastric bypass 03/20/2014   Hyperkalemia 03/20/2014   History of prostate cancer 12/26/2013   Essential hypertension 12/26/2013   Encounter for anticoagulation discussion and counseling 12/26/2013    PCP: [name]  Devonne Doughty, DO  REFERRING PROVIDER: Nobie Hill *  REFERRING DIAGNOSIS: M19.011,M19.012 (ICD-10-CM) - Localized osteoarthritis of shoulder regions, bilateral, M65.30 (ICD-10-CM) - Trigger finger of right hand, unspecified finger, M25.511,G89.29,M25.512 (ICD-10-CM) - Chronic pain of both shoulders, M53.3 (ICD-10-CM) - Sacroiliac joint pain, G89.4 (ICD-10-CM) - Chronic pain syndrome  THERAPY DIAG: Other low back pain  Chronic left shoulder pain  Chronic right shoulder pain  Muscle weakness  (generalized)  ONSET DATE: 04/30/19  FOLLOW UP APPT WITH PROVIDER: Yes    From initial evaluation (04/29/21)  SUBJECTIVE:                                                                                                                                                                                         Chief Complaint: Bilateral shoulder pain  Pertinent History [name] is a 66 y.o. year old, male patient referred to physical therapy by pain management for bilateral shoulder pain. Pt reports chronic bilateral shoulder pain for approximately the last 2 years. The pain started in his R wrist which improved after an injection however the pain gradually started worsening and then moving up his R arm to his R shoulder. Eventually he started having pain in his L shoulder as well. He noticed the most significant pain when lifting his arms to wash under them while bathing. He had injections in both shoulders on March 27th with significant improvement in pain since that time. At this point pt states that he is not having any shoulder pain. He has a past medical history of Cardiomyopathy (in setting of Afib), DJD (degenerative joint disease) of knee, History of kidney stones, Intervertebral disc disorder with radiculopathy of lumbosacral region, Kidney stone (2012), Lower extremity edema, PAF (paroxysmal atrial fibrillation) ([place]), Pernicious anemia, Prostate cancer ([place]), Pulmonary nodule, right, and SVT (supraventricular tachycardia) ([place]). He also  has a past surgical history that includes Knee surgery; Gastric bypass (2002); prostate seeding; Carpal tunnel release (Right, 12/23/2014); Carpal tunnel release (Left, 01/06/2015); Colonoscopy with propofol (N/A, 10/08/2017); and Cardioversion (N/A, 05/15/2019). He has tapered off a lot of his medications over the last few months because he felt like a lot of his symptoms were coming from medication side effects. He has also noted that he started having  anxiety attacks since January which is new for him. Pt and his wife joined [employer] and he recently ordered an upright walker with forearm supports to help his mobility. Pt reports losing 41# in the last 2 years secondary to drop in his calorie intake.    Pain:  Pain Intensity: Present: 0/10, Best: 0/10, Worst: 0/10 (not currently hurting) Pain location: Bilateral  shoulders Pain Quality: sharp and aching  Radiating: No  Numbness/Tingling: Yes, bilateral hand numbness occasionally as well as bilateral foot numbness.  Focal Weakness: No, however pt has noticed weakness in both shoulders and hands. Aggravating factors: Lifting arms; Relieving factors: injections, pain medication, no benefit with heat/topical creams, unable to take NSAIDs secondary to taking Xarelto 24-hour pain behavior: Constant pain all the time (not currently); History of prior shoulder or neck injury, pain, surgery, or therapy: No Falls: Has patient fallen in last 6 months? No,  Dominant hand: right Imaging: Yes, R shoulder radiographs (03/29/21): Mild glenohumeral and mild acromioclavicular osteoarthritis; L shoulder radiographs (03/29/21):  Mild-to-moderate glenohumeral and mild acromioclavicular osteoarthritis. 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 Otherwise negative  Precautions: None  Weight Bearing Restrictions: No  Living Environment Lives with: lives with their spouse Lives in: House/apartment  Patient Goals: Improve shoulder strength and prevent pain from returning. Pt would also like to address his back  pain/sciatica;   OBJECTIVE:   Patient Surveys  FOTO: 61, predicted improvement to 68  Cognition Patient is oriented to person, place, and time.  Recent memory is intact.  Remote memory is intact.  Attention span and concentration are intact.  Expressive speech is intact.  Patient's fund of knowledge is within normal limits for educational level.   Gross Musculoskeletal Assessment Tremor: None Bulk: Normal Tone: Normal  Gait Deferred  Posture Forward head and rounded shoulders in both sitting and standing;  Cervical Screen AROM: WFL and painless with overpressure in all planes Spurlings A (ipsilateral lateral flexion/axial compression): R: Negative L: Negative Spurlings B (ipsilateral lateral flexion/contralateral rotation/axial compression): R: Negative L: Negative Repeated movement: Not tested Hoffman Sign (cervical cord compression): R: Positive L: Negative ULTT Median: R: Not examined L: Not examined ULTT Ulnar: R: Not examined L: Not examined ULTT Radial: R: Not examined L: Not examined   AROM  AROM (Normal range in degrees) AROM 06/22/2021  Cervical  Flexion (50) WNL  Extension (80) WNL  Right lateral flexion (45) WNL  Left lateral flexion (45) WNL  Right rotation (85) WNL  Left rotation (85) WNL     Right Left  Shoulder    Flexion 177 170  Extension    Abduction 155 146  External Rotation 90 90  Internal Rotation 70 70    Elbow    Flexion WNL WNL  Extension WNL WNL  Pronation WNL WNL  Supination WNL WNL  (* = pain; Blank rows = not tested)    LE MMT:  MMT (out of 5) Right 06/22/2021 Left 06/22/2021  Cervical (isometric)  Flexion   Extension   Lateral Flexion    Rotation        Shoulder   Flexion 4+ 5  Extension    Abduction 5 5*  External rotation (seated) 5 5  Internal rotation (seated) 5 5  Horizontal abduction    Horizontal adduction    Lower Trapezius    Rhomboids        Elbow  Flexion 5 5  Extension 5 5  Pronation 5 5   Supination 5 5      Wrist  Flexion 5 5  Extension 5 5  Radial deviation 5 5  Ulnar  deviation 5 5      MCP  Flexion    Extension    Abduction 5 5  Adduction 5 5  (* = pain; Blank rows = not tested)  Sensation Deferred  Reflexes Deferred   Palpation No pain with palpation to anterior, lateral, and posterior shoulders bilaterally;   Passive Accessory Intervertebral Motion Deferred    Accessory Motions/Glides Glenohumeral: Posterior: R: abnormal (hypomobile) L: abnormal (hypomobile) Inferior: R: not examined L: not examined Anterior: R: not examined L: not examined  Acromioclavicular:  Posterior: R: not examined L: not examined Anterior: R: not examined L: not examined  Sternoclavicular: Posterior: R: not examined L: not examined Anterior: R: not examined L: not examined Superior: R: not examined L: not examined Inferior: R: not examined L: not examined  Scapulothoracic: Distraction: R: not examined L: not examined Medial: R: not examined L: not examined Lateral: R: not examined L: not examined Inferior: R: not examined L: not examined Superior: R: not examined L: not examined  Muscle Length Testing Deferred    SPECIAL TESTS  Rotator Cuff  Drop Arm Test: Negative Painful Arc (Pain from 60 to 120 degrees scaption): Negative Infraspinatus Muscle Test: Negative  Subacromial Impingement Hawkins-Kennedy: R: Negative L: Negative Neer (Block scapula, PROM flexion): R: Negative L: Negative Painful Arc (Pain from 60 to 120 degrees scaption): R: Negative L: Negative Empty Can: R: Not tested L: Not tested External Rotation Resistance: R: Negative L: Negative Horizontal Adduction: Not examined Scapular Assist: R: Not tested L: Not tested  Labral Tear Biceps Load II (120 elevation, full ER, 90 elbow flexion, full supination, resisted elbow flexion): R: Negative L: Negative Crank (160 scaption, axial load with IR/ER): R: Positive L: Negative Active  Compression Test: R: Not tested L: Not tested  Bicep Tendon Pathology Speed (shoulder flexion to 90, external rotation, full elbow extension, and forearm supination with resistance: Not examined Yergason's (resisted shoulder ER and supination/biceps tendon pathology): R: Positive L: Positive  Shoulder Instability Sulcus Sign: R: Not tested L: Not tested Anterior Apprehension: R: Negative L: Negative  Beighton scale: Deferred   Back Assessment 05/04/21  Pertinent Back Pain History Mr. DRUE HARR is a 66 y.o. year old, male patient referred to physical therapy by pain management for bilateral shoulder pain. He also reports a history of chronic low back pain. He has had intermittent occasional acute flares of back pain for decades. However pain worsened around 2017 when he noticed LLE sciatica and started having LLE weakness/buckling. He saw a chiropractor in [place] who thought it was better to send him for [facility] which significantly helped the pain. He traveled to [place] and while he was there his pain worsened. When he returned he had another ESI which improved his pain however it eventually returned.  For the last two years that he was working he could hardly walk due to the pain. He saw Dr. Sabra Heck (ortho) who referred him to Dr. Holley Raring with pain management. Pt has had lumbar injections and the pain improves with injections however it return after about a month. He had a radiofrequency ablation for his L lumbar spine in early February and R side of his lumbar spine in late February. Once he had the procedure on the R side he had complete resolution of his pain however it has gradually returned on the L side. Pain radiates into his L buttock down to the L knee. He is currently on a course prednisone.   Pain:  Pain Intensity: Present: 2/10,  Best: 0/10, Worst: 10/10 Pain location: Central low back spreading out to the left side; Pain Quality: aching, deep Radiating: Yes, into the L buttock and  down the L posterior thigh to the knee  Numbness/Tingling: Yes, numbness/tingling into L calf and in L foot Focal Weakness: Yes, occasional feeling of bilateral LE weakness but not as bad as it had been previously Aggravating factors: walking, sometimes starts spontaneously, extended sitting in the evening,  Relieving factors: pain medications (takes infrequently), sometimes laying down helps, heat (pt unsure if its the heat or laying down that actually helps) 24-hour pain behavior: Better in the AM, worse as the day progresses; Imaging: Lumbar radiograph 12/08/20: FINDINGS: 13 mm anterolisthesis L5 on S1. 5 mm retrolisthesis L4 on L5. 7 mm retrolisthesis L2 on L3. Vertebral body heights are maintained. There is mild scoliosis of the lumbar spine. Multilevel degenerative change with marked disc space narrowing and osteophyte at L4-L5, mild degenerative changes at T12-L1, L1-L2 and L2-L3. Facet degenerative changes at multiple levels. Aortic atherosclerosis. With flexion, grossly stable 13 mm anterolisthesis L5 on S1 and retrolisthesis L4 on L5. Retrolisthesis at L2-L3 slightly diminishes to 4 mm. With extension, similar 13 mm anterolisthesis L5 on S1 and similar retrolisthesis L4 on L5. 8 mm retrolisthesis L2 on L3.  Lumbar MRI 09/12/18: (see entire report however significant findings below): Baseline narrowing of the vertebral canal due to congenitally short pedicles, degenerative changes thorughout the lumbar spine including moderate to severe left neuroforaminal narrowing at L4-5, bilateral mildly diastatic pars defects trace anterolisthesis L5 on S1, mild R facet edema at L4-5 and mild periarticular soft tissue edema at L4-5 bilaterally, mild R facet edema at L5-S1, sclerotic lesion in the L2 vertebral body and the left aspect of the S2 body. Given the patient's history of prostate cancer, sequela of prostate metastasis cannot be excluded.  History of prior back injury, pain, surgery, or therapy: Yes,  Nicole Kindred PT years ago for back pain, has had chiropractic care as well  Red flags: History of prostate cancer, progressive weight loss over the last couple years. Denies bowel/bladder changes, saddle paresthesia, h/o spinal tumors, h/o compression fx, h/o abdominal aneurysm, abdominal pain, chills/fever, night sweats, nausea, vomiting;  Precautions: None  Weight Bearing Restrictions: No   Patient Goals: Decrease back pain and improve his ability to be active and exercise;   OBJECTIVE:   Cognition Patient is oriented to person, place, and time.  Recent memory is intact.  Remote memory is intact.  Attention span and concentration are intact.  Expressive speech is intact.  Patient's fund of knowledge is within normal limits for educational level.     Gross Musculoskeletal Assessment Tremor: None Bulk: Normal Tone: Normal No visible step-off along spinal column;  GAIT: Deferred  Posture: Lumbar lordosis: WNL Iliac crest height: Equal bilaterally Lumbar lateral shift: Negative History of scoliosis reported on imaging but not evident during physical exam today  AROM  AROM (Normal range in degrees) AROM  06/22/2021  Lumbar   Flexion (65) WNL  Extension (30) Mild deficit  Right lateral flexion (25) Mild deficit  Left lateral flexion (25) Mild deficit  Right rotation (30) Mild deficit  Left rotation (30) Mild deficit      Hip Right Left  Flexion (125) WNL WNL  Extension (15) Moderate limitation Moderate limitation  Abduction (40)    Adduction     Internal Rotation (45) >30 degrees >30 degrees  External Rotation (45) >30 degrees >30 degrees       Knee  Flexion (135) WNL WNL  Extension (0) WNL WNL      Ankle    Dorsiflexion (20)    Plantarflexion (50)    Inversion (35)    Eversion (15    (* = pain; Blank rows = not tested)   LE MMT:  MMT (out of 5) Right 06/22/2021 Left 06/22/2021  Hip flexion 5 5  Hip extension    Hip abduction (seated) 5 5  Hip adduction  (seated) 5 5  Hip internal rotation 5 5  Hip external rotation 5 5  Knee flexion 5 5  Knee extension 5 5  Ankle dorsiflexion 5 5  Ankle plantarflexion    Ankle inversion    Ankle eversion    (* = pain; Blank rows = not tested)   Sensation Grossly intact to light touch bilateral LEs as determined by testing dermatomes L2-S2. Proprioception, and hot/cold testing deferred on this date.   Reflexes Deferred   Muscle Length Hamstrings: R: Positive for limitation around 70 degrees L: Positive for limitation around 70 degrees Ely (quadriceps): R: Not examined L: Not examined Thomas (hip flexors): R: Not examined L: Not examined Ober: R: Not examined L: Not examined   Palpation  Location LEFT  RIGHT           Lumbar paraspinals 1 1  Quadratus Lumborum    Gluteus Maximus 0 0  Gluteus Medius 0 0  Deep hip external rotators 1 0  PSIS 0 0  Fortin's Area (SIJ) 0 0  Greater Trochanter 0 0  (Blank rows = not tested) Graded on 0-4 scale (0 = no pain, 1 = pain, 2 = pain with wincing/grimacing/flinching, 3 = pain with withdrawal, 4 = unwilling to allow palpation), (Blank rows = not tested)   Passive Accessory Intervertebral Motion Pt reports mild back pain with CPA L3-L5 and UPA bilaterally L3-L5. Generally, hypomobile throughout    SPECIAL TESTS Lumbar Radiculopathy and Discogenic: Centralization and Peripheralization (SN 92, -LR 0.12): Not examined Slump (SN 83, -LR 0.32): R: Negative L: Negative SLR (SN 92, -LR 0.29): R: Negative L:  Negative Crossed SLR (SP 90): R: Negative L: Negative  Facet Joint: Extension-Rotation (SN 100, -LR 0.0): R: Negative L: Negative  Lumbar Foraminal Stenosis: Lumbar quadrant (SN 70): R: Negative L: Negative  Hip: FABER (SN 81): R: Negative L: Negative FADIR (SN 94): R: Negative L: Negative Hip scour (SN 50): R: Negative L: Negative  SIJ:  Thigh Thrust (SN 88, -LR 0.18) : R: Not examined L: Not examined  Piriformis Syndrome: FAIR Test  (SN 88, SP 83): R: Not examined L: Not examined  Functional Tasks Deferred  Beighton scale: Deferred   TODAY'S TREATMENT  SUBJECTIVE: Pt reports that he has continued with significant shoulder and back pain.  He rates his low back pain as 2-3/10 upon arrival and rates his shoulder pain as 3/10. He saw Dr. Holley Raring and is scheduled for a R shoulder injection as well as a trigger finger injection next week. He has had to take pain medication frequently over the last week because of increased pain. No specific questions currently.  PAIN: 3/10 shoulder pain, 2-3/10 back pain   Ther-ex  NuStep (seat 13, arms 10) L2 BUE/BLE x 5 minutes for warm-up during interval history  All exercises performed on incline; Chest press with cane and manual resistance from therapist 2 x 15; Rows with cane and manual resistance from therapist 2 x 15; Shoulder flexion 4# dumbbell (DB) in LUE, 1# DB in RUE 2  x 10 BUE; Shoulder scaption 4# dumbbell (DB) in LUE, 1# DB in RUE 2 x 15 BUE; Incline overhead shoulder press with 4# DB in LUE, 1# DB in RUE 2 x 15; Bicep curls with 4# DB in BUE x 20 BUE; Clams with manual resistance from therapist x 15, x 20; Adductor squeezes with manual resistance from therapist 2 x 20 BLE;  Supine SLR 2 x 10 BLE; Supine heel slides with manually resisted extension 2 x 10 BLE; Hooklying bridges 2 x 10;   Not performed today: Double leg heel raises 2 x 10 Seated Nautilus rows 30# x 10, 40# 2 x 10; Seated Nautilus chest press 40# 3 x 10; Seated Pallof press with green tband 2 x 10 toward each side; Supine (incline) SLR 2 x 10 RLE, x 12/x 10 LLE; Supine (incline) partial bridges 2 x 12; Seated shoulder "Ws" with green tband resistance x 10; Seated shoulder IR with green tband resistance x 10, x 15 RLE, 2 x 15 LUE; Mini squats 2 x 10; Standing exercises with 3# ankle weights: Hip flexion marches 2 x 10 BLE; Hip abduction 2 x 10 BLE; Hamstring curls 2 x 10 BLE; Hip extension 2 x  10 BLE;   PATIENT EDUCATION:  Education details: Pt educated throughout session about proper posture and technique with exercises. Improved exercise technique, movement at target joints, use of target muscles after min to mod verbal, visual, tactile cues Person educated: Patient Education method: Explanation Education comprehension: verbalized understanding and returned demonstration;   HOME EXERCISE PROGRAM: Access Code: [email or web address] URL: [email or web address] Date: 06/08/2021 Prepared by: Roxana Hires  Exercises - Single Arm Shoulder Flexion with Dumbbell  - 1 x daily - 3 x weekly - 2-3 sets - 10 reps - 2s hold - Single Arm Scaption with Dumbbell  - 1 x daily - 3 x weekly - 2-3 sets - 10 reps - 2s hold - Seated Shoulder Row with Anchored Resistance  - 1 x daily - 3 x weekly - 2-3 sets - 10 reps - 2s hold - Shoulder W - External Rotation with Resistance  - 1 x daily - 3 x weekly - 2-3 sets - 10 reps - 2s hold - Supine Active Straight Leg Raise  - 1 x daily - 7 x weekly - 2-3 sets - 10 reps - 2s hold - Seated March with Resistance  - 1 x daily - 7 x weekly - 2-3 sets - 10 reps - 2s hold - Seated Hip Abduction with Resistance  - 1 x daily - 7 x weekly - 2-3 sets - 10 reps - 2s hold - Seated Hip Adduction Isometrics with Ball  - 1 x daily - 7 x weekly - 2-3 sets - 10 reps - 2s hold - Mini Squat with Counter Support  - 1 x daily - 7 x weekly - 2-3 sets - 10 reps - 2s hold   ASSESSMENT:  CLINICAL IMPRESSION:  Continued upper quarter strengthening during session today as well as hip and abdominal exercises. Pt reports appropriate muscle fatigue and demonstrates excellent effort. He reports more shoulder pain today with exercise so reps limited to manage pain. Continued with 4# dumbbell in LUE and 1# dumbbell in RUE but pt is able to perform 4# dumbbells with bicep curls in both arms. Pt has an appointment for a shoulder injection next week. Pt encouraged to follow-up with PT as  scheduled. He will benefit from PT services to address deficits in strength and pain in  order to return to full function at home and with leisure activities.    REHAB POTENTIAL: Good  CLINICAL DECISION MAKING: Stable/uncomplicated  EVALUATION COMPLEXITY: Low   GOALS: Goals reviewed with patient? No  SHORT TERM GOALS: Target date: 08/03/2021  Pt will be independent with HEP to improve strength and decrease shoulder and back pain to improve pain-free function at home and work. Baseline:  Goal status: ONGOING   LONG TERM GOALS: Target date: 07/22/2021  Pt will increase shoulder FOTO to at least 62 to demonstrate significant improvement in function at home and work related to shoulder pain  Baseline: 04/29/21: 50, 05/30/21: 57 Goal status: PARTIALLY MET  2.  Pt will report at least 75% improvement in shoulder symptoms with all activities and feel confident with gym-based exercise program in order to maintain pain-free shoulder function. Baseline: 05/30/21: Pt reports minimal shoulder pain since starting therapy until this weekend with new injury Goal status: ACHIEVED  3. Pt will decrease mODI scoreby at least 13 points in order demonstrate clinically significant reduction in pain/disability related to his back pain Baseline: 05/30/21: 72% Goal status: INITIAL  4. Pt will decrease worst back pain as reported on NPRS by at least 2 points in order to demonstrate clinically significant reduction in back pain.  Baseline: 05/30/21: worst: 10/10 Goal status: INITIAL    PLAN: PT FREQUENCY: 2x/week  PT DURATION: 12 weeks  PLANNED INTERVENTIONS: Therapeutic exercises, Therapeutic activity, Neuromuscular re-education, Balance training, Gait training, Patient/Family education, Joint mobilization, Vestibular training, Canalith repositioning, Aquatic Therapy, Dry Needling, Cognitive remediation, Electrical stimulation, Spinal manipulation, Spinal mobilization, Cryotherapy, Moist heat, Traction,  Ultrasound, Ionotophoresis 21m/ml Dexamethasone, and Manual therapy  PLAN FOR NEXT SESSION: Continue strengthening for shoulders and back, review and modify HEP as needed,  JLyndel SafeHuprich PT, DPT, GCS  Jalicia Roszak 06/22/2021, 8:52 PM

## 2021-06-23 ENCOUNTER — Ambulatory Visit: Payer: Medicare Other

## 2021-06-23 DIAGNOSIS — M6281 Muscle weakness (generalized): Secondary | ICD-10-CM | POA: Diagnosis not present

## 2021-06-23 DIAGNOSIS — M5459 Other low back pain: Secondary | ICD-10-CM

## 2021-06-23 DIAGNOSIS — G8929 Other chronic pain: Secondary | ICD-10-CM | POA: Diagnosis not present

## 2021-06-23 DIAGNOSIS — M25512 Pain in left shoulder: Secondary | ICD-10-CM | POA: Diagnosis not present

## 2021-06-23 DIAGNOSIS — M25511 Pain in right shoulder: Secondary | ICD-10-CM | POA: Diagnosis not present

## 2021-06-23 NOTE — Therapy (Signed)
OUTPATIENT PHYSICAL THERAPY SHOULDER/BACK TREATMENT   Patient Name: MORLEY GAUMER MRN: [id number] DOB:October 18, 1955, 66 y.o., male Today's Date: 06/23/2021   PT End of Session - 06/23/21 1313     Visit Number 16    Number of Visits 25    Date for PT Re-Evaluation 07/22/21    Authorization Type eval: 04/29/21    PT Start Time 1310    PT Stop Time 1355    PT Time Calculation (min) 45 min    Activity Tolerance Patient tolerated treatment well    Behavior During Therapy WFL for tasks assessed/performed               Past Medical History:  Diagnosis Date   Cardiomyopathy (in setting of Afib)    a. 12/2013 Echo: EF 45-50%, mild ant and antsept HK. mild MR. Mod dil LA. nl RV fxn. Rhythm was Afib.   DJD (degenerative joint disease) of knee    History of kidney stones    Intervertebral disc disorder with radiculopathy of lumbosacral region    Kidney stone 2012   Lower extremity edema    PAF (paroxysmal atrial fibrillation) (HCC)    Pernicious anemia    Prostate cancer ([place])    Pulmonary nodule, right    a. 10/2014 Cardiac CTA: 24m RLL nodule; b. 04/2015 CT Chest: stable 7108mRLL nodule. No new nodules; 02/2017 CTA Chest: stable, benign, 66m73mLL pulm nodule.   SVT (supraventricular tachycardia) ([facility]  Past Surgical History:  Procedure Laterality Date   CARDIOVERSION N/A 05/15/2019   Procedure: CARDIOVERSION;  Surgeon: GolMinna MerrittsD;  Location: [facility] ORS;  Service: Cardiovascular;  Laterality: N/A;   CARPAL TUNNEL RELEASE Right 12/23/2014   Procedure: CARPAL TUNNEL RELEASE;  Surgeon: HowEarnestine LeysD;  Location: [facility] OR[facility];  Service: Orthopedics;  Laterality: Right;   CARPAL TUNNEL RELEASE Left 01/06/2015   Procedure: CARPAL TUNNEL RELEASE;  Surgeon: HowEarnestine LeysD;  Location: [facility];  Service: Orthopedics;  Laterality: Left;   COLONOSCOPY WITH PROPOFOL N/A 10/08/2017   Procedure: COLONOSCOPY WITH PROPOFOL;  Surgeon: VanLin LandsmanD;  [facility];   Service: Gastroenterology;  Laterality: N/A;   GASTRIC BYPASS  2002   KNEE SURGERY     knee trauma   prostate seeding     Patient Active Problem List   Diagnosis Date Noted   Lesion of bone of lumbosacral spine (L5) 05/12/2021   Localized osteoarthritis of shoulder regions, bilateral 03/29/2021   Chronic pain of both shoulders 03/29/2021   Drug-induced myopathy 02/21/2021   Trigger finger of right hand 11/15/2020   Prostate cancer ([facility]/2020   Insomnia 08/01/2019   Primary osteoarthritis of both wrists 02/17/2019   Trigger middle finger of left hand 02/17/2019   Chronic pain syndrome 02/17/2019   Chronic radicular lumbar pain 10/08/2018   Lumbar radiculopathy 10/08/2018   Lumbar degenerative disc disease 10/08/2018   Lumbar facet arthropathy 10/08/2018   Lumbar facet joint syndrome 10/08/2018   Intervertebral disc disorder with radiculopathy of lumbosacral region    Pernicious anemia 05/22/2016   Paroxysmal atrial fibrillation ([facility]/04/2015   DJD (degenerative joint disease) of knee 10/27/2014   Bilateral carpal tunnel syndrome 10/27/2014   Morbid obesity with BMI of 50.0-59.9, adult ([facility]/11/2014   Mixed hyperlipidemia 10/27/2014   H/O gastric bypass 03/20/2014   Hyperkalemia 03/20/2014   History of prostate cancer 12/26/2013   Essential hypertension 12/26/2013   Encounter for anticoagulation discussion and counseling 12/26/2013    PCP: KarParks Ranger  Devonne Doughty, DO  REFERRING PROVIDER: Nobie Putnam *  REFERRING DIAGNOSIS: M19.011,M19.012 (ICD-10-CM) - Localized osteoarthritis of shoulder regions, bilateral, M65.30 (ICD-10-CM) - Trigger finger of right hand, unspecified finger, M25.511,G89.29,M25.512 (ICD-10-CM) - Chronic pain of both shoulders, M53.3 (ICD-10-CM) - Sacroiliac joint pain, G89.4 (ICD-10-CM) - Chronic pain syndrome  THERAPY DIAG: Other low back pain  Chronic left shoulder pain  Chronic right shoulder pain  Muscle weakness  (generalized)  ONSET DATE: 04/30/19  FOLLOW UP APPT WITH PROVIDER: Yes    From initial evaluation (04/29/21)  SUBJECTIVE:                                                                                                                                                                                         Chief Complaint: Bilateral shoulder pain  Pertinent History Mr. RACHAEL ZAPANTA is a 66 y.o. year old, male patient referred to physical therapy by pain management for bilateral shoulder pain. Pt reports chronic bilateral shoulder pain for approximately the last 2 years. The pain started in his R wrist which improved after an injection however the pain gradually started worsening and then moving up his R arm to his R shoulder. Eventually he started having pain in his L shoulder as well. He noticed the most significant pain when lifting his arms to wash under them while bathing. He had injections in both shoulders on March 27th with significant improvement in pain since that time. At this point pt states that he is not having any shoulder pain. He has a past medical history of Cardiomyopathy (in setting of Afib), DJD (degenerative joint disease) of knee, History of kidney stones, Intervertebral disc disorder with radiculopathy of lumbosacral region, Kidney stone (2012), Lower extremity edema, PAF (paroxysmal atrial fibrillation) ([place]), Pernicious anemia, Prostate cancer ([place]), Pulmonary nodule, right, and SVT (supraventricular tachycardia) ([place]). He also  has a past surgical history that includes Knee surgery; Gastric bypass (2002); prostate seeding; Carpal tunnel release (Right, 12/23/2014); Carpal tunnel release (Left, 01/06/2015); Colonoscopy with propofol (N/A, 10/08/2017); and Cardioversion (N/A, 05/15/2019). He has tapered off a lot of his medications over the last few months because he felt like a lot of his symptoms were coming from medication side effects. He has also noted that he started having  anxiety attacks since January which is new for him. Pt and his wife joined [employer] and he recently ordered an upright walker with forearm supports to help his mobility. Pt reports losing 41# in the last 2 years secondary to drop in his calorie intake.    Pain:  Pain Intensity: Present: 0/10, Best: 0/10, Worst: 0/10 (not currently hurting) Pain location: Bilateral  shoulders Pain Quality: sharp and aching  Radiating: No  Numbness/Tingling: Yes, bilateral hand numbness occasionally as well as bilateral foot numbness.  Focal Weakness: No, however pt has noticed weakness in both shoulders and hands. Aggravating factors: Lifting arms; Relieving factors: injections, pain medication, no benefit with heat/topical creams, unable to take NSAIDs secondary to taking Xarelto 24-hour pain behavior: Constant pain all the time (not currently); History of prior shoulder or neck injury, pain, surgery, or therapy: No Falls: Has patient fallen in last 6 months? No,  Dominant hand: right Imaging: Yes, R shoulder radiographs (03/29/21): Mild glenohumeral and mild acromioclavicular osteoarthritis; L shoulder radiographs (03/29/21):  Mild-to-moderate glenohumeral and mild acromioclavicular osteoarthritis. 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 Otherwise negative  Precautions: None  Weight Bearing Restrictions: No  Living Environment Lives with: lives with their spouse Lives in: House/apartment  Patient Goals: Improve shoulder strength and prevent pain from returning. Pt would also like to address his back  pain/sciatica;   OBJECTIVE:   Patient Surveys  FOTO: 55, predicted improvement to 65  Cognition Patient is oriented to person, place, and time.  Recent memory is intact.  Remote memory is intact.  Attention span and concentration are intact.  Expressive speech is intact.  Patient's fund of knowledge is within normal limits for educational level.   Gross Musculoskeletal Assessment Tremor: None Bulk: Normal Tone: Normal  Gait Deferred  Posture Forward head and rounded shoulders in both sitting and standing;  Cervical Screen AROM: WFL and painless with overpressure in all planes Spurlings A (ipsilateral lateral flexion/axial compression): R: Negative L: Negative Spurlings B (ipsilateral lateral flexion/contralateral rotation/axial compression): R: Negative L: Negative Repeated movement: Not tested Hoffman Sign (cervical cord compression): R: Positive L: Negative ULTT Median: R: Not examined L: Not examined ULTT Ulnar: R: Not examined L: Not examined ULTT Radial: R: Not examined L: Not examined   AROM  AROM (Normal range in degrees) AROM 06/23/2021  Cervical  Flexion (50) WNL  Extension (80) WNL  Right lateral flexion (45) WNL  Left lateral flexion (45) WNL  Right rotation (85) WNL  Left rotation (85) WNL     Right Left  Shoulder    Flexion 177 170  Extension    Abduction 155 146  External Rotation 90 90  Internal Rotation 70 70    Elbow    Flexion WNL WNL  Extension WNL WNL  Pronation WNL WNL  Supination WNL WNL  (* = pain; Blank rows = not tested)    LE MMT:  MMT (out of 5) Right 06/23/2021 Left 06/23/2021  Cervical (isometric)  Flexion   Extension   Lateral Flexion    Rotation        Shoulder   Flexion 4+ 5  Extension    Abduction 5 5*  External rotation (seated) 5 5  Internal rotation (seated) 5 5  Horizontal abduction    Horizontal adduction    Lower Trapezius    Rhomboids        Elbow  Flexion 5 5  Extension 5 5  Pronation 5 5   Supination 5 5      Wrist  Flexion 5 5  Extension 5 5  Radial deviation 5 5  Ulnar  deviation 5 5      MCP  Flexion    Extension    Abduction 5 5  Adduction 5 5  (* = pain; Blank rows = not tested)  Sensation Deferred  Reflexes Deferred   Palpation No pain with palpation to anterior, lateral, and posterior shoulders bilaterally;   Passive Accessory Intervertebral Motion Deferred    Accessory Motions/Glides Glenohumeral: Posterior: R: abnormal (hypomobile) L: abnormal (hypomobile) Inferior: R: not examined L: not examined Anterior: R: not examined L: not examined  Acromioclavicular:  Posterior: R: not examined L: not examined Anterior: R: not examined L: not examined  Sternoclavicular: Posterior: R: not examined L: not examined Anterior: R: not examined L: not examined Superior: R: not examined L: not examined Inferior: R: not examined L: not examined  Scapulothoracic: Distraction: R: not examined L: not examined Medial: R: not examined L: not examined Lateral: R: not examined L: not examined Inferior: R: not examined L: not examined Superior: R: not examined L: not examined  Muscle Length Testing Deferred    SPECIAL TESTS  Rotator Cuff  Drop Arm Test: Negative Painful Arc (Pain from 60 to 120 degrees scaption): Negative Infraspinatus Muscle Test: Negative  Subacromial Impingement Hawkins-Kennedy: R: Negative L: Negative Neer (Block scapula, PROM flexion): R: Negative L: Negative Painful Arc (Pain from 60 to 120 degrees scaption): R: Negative L: Negative Empty Can: R: Not tested L: Not tested External Rotation Resistance: R: Negative L: Negative Horizontal Adduction: Not examined Scapular Assist: R: Not tested L: Not tested  Labral Tear Biceps Load II (120 elevation, full ER, 90 elbow flexion, full supination, resisted elbow flexion): R: Negative L: Negative Crank (160 scaption, axial load with IR/ER): R: Positive L: Negative Active  Compression Test: R: Not tested L: Not tested  Bicep Tendon Pathology Speed (shoulder flexion to 90, external rotation, full elbow extension, and forearm supination with resistance: Not examined Yergason's (resisted shoulder ER and supination/biceps tendon pathology): R: Positive L: Positive  Shoulder Instability Sulcus Sign: R: Not tested L: Not tested Anterior Apprehension: R: Negative L: Negative  Beighton scale: Deferred   Back Assessment 05/04/21  Pertinent Back Pain History Mr. DOMENIK TRICE is a 66 y.o. year old, male patient referred to physical therapy by pain management for bilateral shoulder pain. He also reports a history of chronic low back pain. He has had intermittent occasional acute flares of back pain for decades. However pain worsened around 2017 when he noticed LLE sciatica and started having LLE weakness/buckling. He saw a chiropractor in [place] who thought it was better to send him for [facility] which significantly helped the pain. He traveled to [place] and while he was there his pain worsened. When he returned he had another ESI which improved his pain however it eventually returned.  For the last two years that he was working he could hardly walk due to the pain. He saw Dr. Sabra Heck (ortho) who referred him to Dr. Holley Raring with pain management. Pt has had lumbar injections and the pain improves with injections however it return after about a month. He had a radiofrequency ablation for his L lumbar spine in early February and R side of his lumbar spine in late February. Once he had the procedure on the R side he had complete resolution of his pain however it has gradually returned on the L side. Pain radiates into his L buttock down to the L knee. He is currently on a course prednisone.   Pain:  Pain Intensity: Present: 2/10,  Best: 0/10, Worst: 10/10 Pain location: Central low back spreading out to the left side; Pain Quality: aching, deep Radiating: Yes, into the L buttock and  down the L posterior thigh to the knee  Numbness/Tingling: Yes, numbness/tingling into L calf and in L foot Focal Weakness: Yes, occasional feeling of bilateral LE weakness but not as bad as it had been previously Aggravating factors: walking, sometimes starts spontaneously, extended sitting in the evening,  Relieving factors: pain medications (takes infrequently), sometimes laying down helps, heat (pt unsure if its the heat or laying down that actually helps) 24-hour pain behavior: Better in the AM, worse as the day progresses; Imaging: Lumbar radiograph 12/08/20: FINDINGS: 13 mm anterolisthesis L5 on S1. 5 mm retrolisthesis L4 on L5. 7 mm retrolisthesis L2 on L3. Vertebral body heights are maintained. There is mild scoliosis of the lumbar spine. Multilevel degenerative change with marked disc space narrowing and osteophyte at L4-L5, mild degenerative changes at T12-L1, L1-L2 and L2-L3. Facet degenerative changes at multiple levels. Aortic atherosclerosis. With flexion, grossly stable 13 mm anterolisthesis L5 on S1 and retrolisthesis L4 on L5. Retrolisthesis at L2-L3 slightly diminishes to 4 mm. With extension, similar 13 mm anterolisthesis L5 on S1 and similar retrolisthesis L4 on L5. 8 mm retrolisthesis L2 on L3.  Lumbar MRI 09/12/18: (see entire report however significant findings below): Baseline narrowing of the vertebral canal due to congenitally short pedicles, degenerative changes thorughout the lumbar spine including moderate to severe left neuroforaminal narrowing at L4-5, bilateral mildly diastatic pars defects trace anterolisthesis L5 on S1, mild R facet edema at L4-5 and mild periarticular soft tissue edema at L4-5 bilaterally, mild R facet edema at L5-S1, sclerotic lesion in the L2 vertebral body and the left aspect of the S2 body. Given the patient's history of prostate cancer, sequela of prostate metastasis cannot be excluded.  History of prior back injury, pain, surgery, or therapy: Yes,  Nicole Kindred PT years ago for back pain, has had chiropractic care as well  Red flags: History of prostate cancer, progressive weight loss over the last couple years. Denies bowel/bladder changes, saddle paresthesia, h/o spinal tumors, h/o compression fx, h/o abdominal aneurysm, abdominal pain, chills/fever, night sweats, nausea, vomiting;  Precautions: None  Weight Bearing Restrictions: No   Patient Goals: Decrease back pain and improve his ability to be active and exercise;   OBJECTIVE:   Cognition Patient is oriented to person, place, and time.  Recent memory is intact.  Remote memory is intact.  Attention span and concentration are intact.  Expressive speech is intact.  Patient's fund of knowledge is within normal limits for educational level.     Gross Musculoskeletal Assessment Tremor: None Bulk: Normal Tone: Normal No visible step-off along spinal column;  GAIT: Deferred  Posture: Lumbar lordosis: WNL Iliac crest height: Equal bilaterally Lumbar lateral shift: Negative History of scoliosis reported on imaging but not evident during physical exam today  AROM  AROM (Normal range in degrees) AROM  06/23/2021  Lumbar   Flexion (65) WNL  Extension (30) Mild deficit  Right lateral flexion (25) Mild deficit  Left lateral flexion (25) Mild deficit  Right rotation (30) Mild deficit  Left rotation (30) Mild deficit      Hip Right Left  Flexion (125) WNL WNL  Extension (15) Moderate limitation Moderate limitation  Abduction (40)    Adduction     Internal Rotation (45) >30 degrees >30 degrees  External Rotation (45) >30 degrees >30 degrees       Knee  Flexion (135) WNL WNL  Extension (0) WNL WNL      Ankle    Dorsiflexion (20)    Plantarflexion (50)    Inversion (35)    Eversion (15    (* = pain; Blank rows = not tested)   LE MMT:  MMT (out of 5) Right 06/23/2021 Left 06/23/2021  Hip flexion 5 5  Hip extension    Hip abduction (seated) 5 5  Hip adduction  (seated) 5 5  Hip internal rotation 5 5  Hip external rotation 5 5  Knee flexion 5 5  Knee extension 5 5  Ankle dorsiflexion 5 5  Ankle plantarflexion    Ankle inversion    Ankle eversion    (* = pain; Blank rows = not tested)   Sensation Grossly intact to light touch bilateral LEs as determined by testing dermatomes L2-S2. Proprioception, and hot/cold testing deferred on this date.   Reflexes Deferred   Muscle Length Hamstrings: R: Positive for limitation around 70 degrees L: Positive for limitation around 70 degrees Ely (quadriceps): R: Not examined L: Not examined Thomas (hip flexors): R: Not examined L: Not examined Ober: R: Not examined L: Not examined   Palpation  Location LEFT  RIGHT           Lumbar paraspinals 1 1  Quadratus Lumborum    Gluteus Maximus 0 0  Gluteus Medius 0 0  Deep hip external rotators 1 0  PSIS 0 0  Fortin's Area (SIJ) 0 0  Greater Trochanter 0 0  (Blank rows = not tested) Graded on 0-4 scale (0 = no pain, 1 = pain, 2 = pain with wincing/grimacing/flinching, 3 = pain with withdrawal, 4 = unwilling to allow palpation), (Blank rows = not tested)   Passive Accessory Intervertebral Motion Pt reports mild back pain with CPA L3-L5 and UPA bilaterally L3-L5. Generally, hypomobile throughout    SPECIAL TESTS Lumbar Radiculopathy and Discogenic: Centralization and Peripheralization (SN 92, -LR 0.12): Not examined Slump (SN 83, -LR 0.32): R: Negative L: Negative SLR (SN 92, -LR 0.29): R: Negative L:  Negative Crossed SLR (SP 90): R: Negative L: Negative  Facet Joint: Extension-Rotation (SN 100, -LR 0.0): R: Negative L: Negative  Lumbar Foraminal Stenosis: Lumbar quadrant (SN 70): R: Negative L: Negative  Hip: FABER (SN 81): R: Negative L: Negative FADIR (SN 94): R: Negative L: Negative Hip scour (SN 50): R: Negative L: Negative  SIJ:  Thigh Thrust (SN 88, -LR 0.18) : R: Not examined L: Not examined  Piriformis Syndrome: FAIR Test  (SN 88, SP 83): R: Not examined L: Not examined  Functional Tasks Deferred  Beighton scale: Deferred   TODAY'S TREATMENT  SUBJECTIVE: Pt reports that his back has been feeling better after the soreness from the last therapy session resolved. He has continued with bilateral shoulder pain especially on the right side but left shoulder is notably painful as well. He is going to reach out to Dr. Holley Raring and see if he can get an injection in his L shoulder at the same time he is getting an injection in his R shoulder. He did pass some BRB in his stool this morning which has happened in the past related to hemorrhoids. No specific questions currently.  PAIN: No back pain, does not rate shoulder pain upon arrival;   Ther-ex  NuStep (seat 12, arms 11) L2 BUE/BLE x 5 minutes for warm-up during interval history  All exercises performed on incline; Shoulder flexion 4# dumbbell (DB) in LUE,  1# DB in RUE 2 x 10 BUE; Incline overhead shoulder press with 4# DB in LUE, 1# DB in RUE 2 x 15; Bicep curls with 5# DB in BUE x 20 BUE; Clams with manual resistance from therapist x 20, x 16; Adductor squeezes with manual resistance from therapist x 20 x 16 BLE;  Supine SLR x 10, x 12 BLE; Supine heel slides with manually resisted extension 2 x 20 BLE; Hooklying bridges 2 x 10; Chest press with cane and manual resistance from therapist x 16; Rows with cane and manual resistance from therapist x 16;   Not performed today: Double leg heel raises 2 x 10 Seated Nautilus rows 30# x 10, 40# 2 x 10; Seated Nautilus chest press 40# 3 x 10; Seated Pallof press with green tband 2 x 10 toward each side; Supine (incline) SLR 2 x 10 RLE, x 12/x 10 LLE; Supine (incline) partial bridges 2 x 12; Seated shoulder "Ws" with green tband resistance x 10; Seated shoulder IR with green tband resistance x 10, x 15 RLE, 2 x 15 LUE; Mini squats 2 x 10; Standing exercises with 3# ankle weights: Hip flexion marches 2 x 10  BLE; Hip abduction 2 x 10 BLE; Hamstring curls 2 x 10 BLE; Hip extension 2 x 10 BLE; Shoulder scaption 4# dumbbell (DB) in LUE, 1# DB in RUE 2 x 15 BUE;   PATIENT EDUCATION:  Education details: Pt educated throughout session about proper posture and technique with exercises. Improved exercise technique, movement at target joints, use of target muscles after min to mod verbal, visual, tactile cues Person educated: Patient Education method: Explanation Education comprehension: verbalized understanding and returned demonstration;   HOME EXERCISE PROGRAM: Access Code: [email or web address] URL: [email or web address] Date: 06/08/2021 Prepared by: Roxana Hires  Exercises - Single Arm Shoulder Flexion with Dumbbell  - 1 x daily - 3 x weekly - 2-3 sets - 10 reps - 2s hold - Single Arm Scaption with Dumbbell  - 1 x daily - 3 x weekly - 2-3 sets - 10 reps - 2s hold - Seated Shoulder Row with Anchored Resistance  - 1 x daily - 3 x weekly - 2-3 sets - 10 reps - 2s hold - Shoulder W - External Rotation with Resistance  - 1 x daily - 3 x weekly - 2-3 sets - 10 reps - 2s hold - Supine Active Straight Leg Raise  - 1 x daily - 7 x weekly - 2-3 sets - 10 reps - 2s hold - Seated March with Resistance  - 1 x daily - 7 x weekly - 2-3 sets - 10 reps - 2s hold - Seated Hip Abduction with Resistance  - 1 x daily - 7 x weekly - 2-3 sets - 10 reps - 2s hold - Seated Hip Adduction Isometrics with Ball  - 1 x daily - 7 x weekly - 2-3 sets - 10 reps - 2s hold - Mini Squat with Counter Support  - 1 x daily - 7 x weekly - 2-3 sets - 10 reps - 2s hold   ASSESSMENT:  CLINICAL IMPRESSION:  Continued upper quarter strengthening during session today as well as hip and abdominal exercises. Pt demonstrate excellent effort and motivation during exercises. He reports appropriate muscle fatigue but less compared to prior sessions. Continued with 4# dumbbell in LUE and 1# dumbbell in RUE but pt is able to perform 5#  dumbbells with bicep curls in both arms which is an increase from last therapy session.  Pt has an appointment for a shoulder injection next Monday. Pt encouraged to follow-up with PT as scheduled. He will benefit from PT services to address deficits in strength and pain in order to return to full function at home and with leisure activities.    REHAB POTENTIAL: Good  CLINICAL DECISION MAKING: Stable/uncomplicated  EVALUATION COMPLEXITY: Low   GOALS: Goals reviewed with patient? No  SHORT TERM GOALS: Target date: 08/04/2021  Pt will be independent with HEP to improve strength and decrease shoulder and back pain to improve pain-free function at home and work. Baseline:  Goal status: ONGOING   LONG TERM GOALS: Target date: 07/22/2021  Pt will increase shoulder FOTO to at least 62 to demonstrate significant improvement in function at home and work related to shoulder pain  Baseline: 04/29/21: 50, 05/30/21: 57 Goal status: PARTIALLY MET  2.  Pt will report at least 75% improvement in shoulder symptoms with all activities and feel confident with gym-based exercise program in order to maintain pain-free shoulder function. Baseline: 05/30/21: Pt reports minimal shoulder pain since starting therapy until this weekend with new injury Goal status: ACHIEVED  3. Pt will decrease mODI scoreby at least 13 points in order demonstrate clinically significant reduction in pain/disability related to his back pain Baseline: 05/30/21: 72% Goal status: INITIAL  4. Pt will decrease worst back pain as reported on NPRS by at least 2 points in order to demonstrate clinically significant reduction in back pain.  Baseline: 05/30/21: worst: 10/10 Goal status: INITIAL    PLAN: PT FREQUENCY: 2x/week  PT DURATION: 12 weeks  PLANNED INTERVENTIONS: Therapeutic exercises, Therapeutic activity, Neuromuscular re-education, Balance training, Gait training, Patient/Family education, Joint mobilization, Vestibular  training, Canalith repositioning, Aquatic Therapy, Dry Needling, Cognitive remediation, Electrical stimulation, Spinal manipulation, Spinal mobilization, Cryotherapy, Moist heat, Traction, Ultrasound, Ionotophoresis 80m/ml Dexamethasone, and Manual therapy  PLAN FOR NEXT SESSION: Continue strengthening for shoulders and back, review and modify HEP as needed,  JLyndel SafeHuprich PT, DPT, GCS  Kana Reimann 06/23/2021, 2:50 PM

## 2021-06-27 ENCOUNTER — Ambulatory Visit
Admission: RE | Admit: 2021-06-27 | Discharge: 2021-06-27 | Disposition: A | Discharge status: Home / Self Care | Payer: Medicare Other | Source: Ambulatory Visit | Attending: Student in an Organized Health Care Education/Training Program | Admitting: Student in an Organized Health Care Education/Training Program

## 2021-06-27 ENCOUNTER — Ambulatory Visit
Payer: Medicare Other | Attending: Student in an Organized Health Care Education/Training Program | Admitting: Student in an Organized Health Care Education/Training Program

## 2021-06-27 ENCOUNTER — Encounter: Payer: Self-pay | Admitting: Student in an Organized Health Care Education/Training Program

## 2021-06-27 VITALS — BP 133/86 | HR 56 | Temp 98.0°F | Resp 18 | Ht 72.0 in | Wt 324.0 lb

## 2021-06-27 DIAGNOSIS — M25511 Pain in right shoulder: Secondary | ICD-10-CM

## 2021-06-27 DIAGNOSIS — M19012 Primary osteoarthritis, left shoulder: Secondary | ICD-10-CM | POA: Insufficient documentation

## 2021-06-27 DIAGNOSIS — G894 Chronic pain syndrome: Secondary | ICD-10-CM | POA: Diagnosis not present

## 2021-06-27 DIAGNOSIS — G8929 Other chronic pain: Secondary | ICD-10-CM | POA: Diagnosis not present

## 2021-06-27 DIAGNOSIS — M6530 Trigger finger, unspecified finger: Secondary | ICD-10-CM | POA: Diagnosis not present

## 2021-06-27 DIAGNOSIS — M19011 Primary osteoarthritis, right shoulder: Secondary | ICD-10-CM | POA: Diagnosis not present

## 2021-06-27 DIAGNOSIS — M25512 Pain in left shoulder: Secondary | ICD-10-CM | POA: Insufficient documentation

## 2021-06-27 MED ORDER — METHYLPREDNISOLONE ACETATE 80 MG/ML IJ SUSP
80.0000 mg | Freq: Once | INTRAMUSCULAR | Status: AC
  Administered 2021-06-27: 80 mg via INTRA_ARTICULAR
  Filled 2021-06-27: qty 1

## 2021-06-27 MED ORDER — LIDOCAINE HCL 2 % IJ SOLN
20.0000 mL | Freq: Once | INTRAMUSCULAR | Status: AC
  Administered 2021-06-27: 400 mg
  Filled 2021-06-27: qty 40

## 2021-06-27 MED ORDER — ROPIVACAINE HCL 2 MG/ML IJ SOLN
4.0000 mL | Freq: Once | INTRAMUSCULAR | Status: AC
  Administered 2021-06-27: 4 mL via INTRA_ARTICULAR
  Filled 2021-06-27: qty 20

## 2021-06-27 MED ORDER — IOHEXOL 180 MG/ML  SOLN
10.0000 mL | Freq: Once | INTRAMUSCULAR | Status: AC
  Administered 2021-06-27: 10 mL via INTRA_ARTICULAR
  Filled 2021-06-27: qty 20

## 2021-06-27 NOTE — Progress Notes (Signed)
PROVIDER NOTE: Interpretation of information contained herein should be left to medically-trained personnel. Specific patient instructions are provided elsewhere under "Patient Instructions" section of medical record. This document was created in part using STT-dictation technology, any transcriptional errors that may result from this process are unintentional.  Patient: [name] Type: Established DOB: Jun 07, 1955 MRN: [id number] PCP: Olin Hauser, DO  Service: Procedure DOS: 06/27/2021 Setting: Ambulatory Location: Ambulatory outpatient facility Delivery: Face-to-face Provider: Gillis Santa, MD Specialty: Interventional Pain Management Specialty designation: 09 Location: Outpatient facility Ref. Prov.: Nobie Putnam *    Primary Reason for Visit: Interventional Pain Management Treatment. CC: Right hand middle finger trigger finger.    Procedure:          Anesthesia, Analgesia, Anxiolysis:  Type: Trigger Finger Ligament/Tendon sheath (20550) Injection.  #3  Purpose: Diagnostic Target Area: Flexor Digitorum Tendon sheath nodule Region: A-1(proximal) pulley of the metacarpal area Approach: Percutaneous Digit: No:3(Middle) Finger Laterality: Right-hand  Type: Local Anesthesia Local Anesthetic: Lidocaine 1-2% Sedation: None  Indication(s):  Analgesia Route: Infiltration ([place]/IM) IV Access: N/A   Position: Sitting   1. Localized osteoarthritis of shoulder regions, bilateral   2. Trigger finger of right hand, unspecified finger   3. Chronic pain of both shoulders   4. Chronic pain syndrome    NAS-11 Pain score:   Pre-procedure: 4 /10   Post-procedure: 2 /10     Pre-op H&P Assessment:  [name] is a 66 y.o. (year old), male patient, seen today for interventional treatment. He  has a past surgical history that includes Knee surgery; Gastric bypass (2002); prostate seeding; Carpal tunnel release (Right, 12/23/2014); Carpal tunnel release (Left, 01/06/2015);  Colonoscopy with propofol (N/A, 10/08/2017); and Cardioversion (N/A, 05/15/2019). [name] has a current medication list which includes the following prescription(s): acetaminophen, amiodarone, amlodipine, aspirin, benazepril, buspirone, cyanocobalamin, duloxetine, fluticasone, furosemide, hydrocodone-acetaminophen, [START ON 07/25/2021] hydrocodone-acetaminophen, ibuprofen, melatonin, metoprolol succinate, multivitamin with minerals, xarelto, trazodone, and diazepam. His primarily concern today is the No chief complaint on file.  Initial Vital Signs:  Pulse/HCG Rate:  (!) 56 ECG Heart Rate: (!) 50 Temp: 98 F (36.7 C) Resp: 16 BP: 134/84 SpO2: 98 %  BMI: Estimated body mass index is 43.94 kg/m as calculated from the following:   Height as of this encounter: 6' (1.829 m).   Weight as of this encounter: 324 lb (147 kg).  Risk Assessment: Allergies: Reviewed. He has No Known Allergies.  Allergy Precautions: None required Coagulopathies: Reviewed. None identified.  Blood-thinner therapy: None at this time Active Infection(s): Reviewed. None identified. [name] is afebrile  Site Confirmation: [name] was asked to confirm the procedure and laterality before marking the site Procedure checklist: Completed Consent: Before the procedure and under the influence of no sedative(s), amnesic(s), or anxiolytics, the patient was informed of the treatment options, risks and possible complications. To fulfill our ethical and legal obligations, as recommended by the American Medical Association's Code of Ethics, I have informed the patient of my clinical impression; the nature and purpose of the treatment or procedure; the risks, benefits, and possible complications of the intervention; the alternatives, including doing nothing; the risk(s) and benefit(s) of the alternative treatment(s) or procedure(s); and the risk(s) and benefit(s) of doing nothing. The patient was provided information about the  general risks and possible complications associated with the procedure. These may include, but are not limited to: failure to achieve desired goals, infection, bleeding, organ or nerve damage, allergic reactions, paralysis, and death. In addition, the patient was informed  of those risks and complications associated to the procedure, such as failure to decrease pain; infection; bleeding; organ or nerve damage with subsequent damage to sensory, motor, and/or autonomic systems, resulting in permanent pain, numbness, and/or weakness of one or several areas of the body; allergic reactions; (i.e.: anaphylactic reaction); and/or death. Furthermore, the patient was informed of those risks and complications associated with the medications. These include, but are not limited to: allergic reactions (i.e.: anaphylactic or anaphylactoid reaction(s)); adrenal axis suppression; blood sugar elevation that in diabetics may result in ketoacidosis or comma; water retention that in patients with history of congestive heart failure may result in shortness of breath, pulmonary edema, and decompensation with resultant heart failure; weight gain; swelling or edema; medication-induced neural toxicity; particulate matter embolism and blood vessel occlusion with resultant organ, and/or nervous system infarction; and/or aseptic necrosis of one or more joints. Finally, the patient was informed that Medicine is not an exact science; therefore, there is also the possibility of unforeseen or unpredictable risks and/or possible complications that may result in a catastrophic outcome. The patient indicated having understood very clearly. We have given the patient no guarantees and we have made no promises. Enough time was given to the patient to ask questions, all of which were answered to the patient's satisfaction. [name] has indicated that he wanted to continue with the procedure. Attestation: I, the ordering provider, attest that I have  discussed with the patient the benefits, risks, side-effects, alternatives, likelihood of achieving goals, and potential problems during recovery for the procedure that I have provided informed consent. Date  Time: 06/27/2021  9:00 AM  Pre-Procedure Preparation:  Monitoring: As per clinic protocol. Respiration, ETCO2, SpO2, BP, heart rate and rhythm monitor placed and checked for adequate function Safety Precautions: Patient was assessed for positional comfort and pressure points before starting the procedure. Time-out: I initiated and conducted the "Time-out" before starting the procedure, as per protocol. The patient was asked to participate by confirming the accuracy of the "Time Out" information. Verification of the correct person, site, and procedure were performed and confirmed by me, the nursing staff, and the patient. "Time-out" conducted as per Joint Commission's Universal Protocol (UP.01.01.01). Time: 1000  Description of Procedure:          Area Prepped: Entire palmar and dorsal aspect of hand, up to forearm area. DuraPrep (Iodine Povacrylex [0.7% available iodine] and Isopropyl Alcohol, 74% w/w) Safety Precautions: Aspiration looking for blood return was conducted prior to all injections. At no point did we inject any substances, as a needle was being advanced. No attempts were made at seeking any paresthesias. Safe injection practices and needle disposal techniques used. Medications properly checked for expiration dates. SDV (single dose vial) medications used. Description of the Procedure: Protocol guidelines were followed. The patient was placed in position. The target area was identified and prepped in the usual manner. Skin & deeper tissues infiltrated with local anesthetic. Appropriate time provided for local anesthetics to take effect. The procedure needle was slowly advanced to target area. Proper needle placement secured. Negative aspiration confirmed. Solution injected in  intermittent fashion, asking for systemic symptoms every 0.5cc. Needle(s) removed and area cleaned, making sure to leave some prepping solution back to take advantage of its long term bactericidal properties.  Vitals:   06/27/21 0907 06/27/21 1000 06/27/21 1005  BP: 134/84 125/84 133/86  Pulse: (!) 56    Resp: '16 18 18  '$ Temp: 98 F (36.7 C)    TempSrc: Temporal  SpO2: 98% 96% 97%  Weight: (!) 324 lb (147 kg)    Height: 6' (1.829 m)      Start Time: 1000 hrs. End Time: 1005 hrs. Materials:  Needle(s) Type: Regular needle Gauge: 25G Length: 1.5-in Medication(s): Please see orders for medications and dosing details. 2 cc solution made of 1.5 cc of 0.2% ropivacaine, 0.5 cc of methylprednisolone, 80 mg/cc.  Injected at A1 at the border of the FDS tendon   Post-operative Assessment:  Post-procedure Vital Signs:  Pulse/HCG Rate:  (!) 56(!) 56 Temp:  98 F (36.7 C) Resp: 18 BP:  133/86 SpO2: 97 %  EBL: None  Complications: No immediate post-treatment complications observed by team, or reported by patient.  Note: The patient tolerated the entire procedure well. A repeat set of vitals were taken after the procedure and the patient was kept under observation following institutional policy, for this type of procedure. Post-procedural neurological assessment was performed, showing return to baseline, prior to discharge. The patient was provided with post-procedure discharge instructions, including a section on how to identify potential problems. Should any problems arise concerning this procedure, the patient was given instructions to immediately contact us, at any time, without hesitation. In any case, we plan to contact the patient by telephone for a follow-up status report regarding this interventional procedure.  Comments:  No additional relevant information.  Plan of Care  Orders:  Orders Placed This Encounter  Procedures   DG PAIN [facility] C-ARM 1-60 MIN NO REPORT     Intraoperative interpretation by procedural physician at [facility].    Standing Status:   Standing    Number of Occurrences:   1    Order Specific Question:   Reason for exam:    Answer:   Assistance in needle guidance and placement for procedures requiring needle placement in or near specific anatomical locations not easily accessible without such assistance.   Chronic Opioid Analgesic:  Tramadol 50 mg 4 times daily as needed, quantity 120/month; MME equals 20    Medications ordered for procedure: Meds ordered this encounter  Medications   iohexol (OMNIPAQUE) 180 MG/ML injection 10 mL    Must be Myelogram-compatible. If not available, you may substitute with a water-soluble, non-ionic, hypoallergenic, myelogram-compatible radiological contrast medium.   lidocaine (XYLOCAINE) 2 % (with pres) injection 400 mg   ropivacaine (PF) 2 mg/mL (0.2%) (NAROPIN) injection 4 mL   methylPREDNISolone acetate (DEPO-MEDROL) injection 80 mg   Medications administered: We administered iohexol, lidocaine, ropivacaine (PF) 2 mg/mL (0.2%), and methylPREDNISolone acetate.  See the medical record for exact dosing, route, and time of administration.  Follow-up plan:   Return in about 5 weeks (around 08/01/2021).      Recent Visits Date Type Provider Dept  06/16/21 Office Visit Gillis Santa, MD [facility]  05/23/21 Office Visit Gillis Santa, MD [facility]  05/12/21 Office Visit Gillis Santa, MD [facility]  04/19/21 Office Visit Gillis Santa, MD [facility]  04/11/21 Procedure visit Gillis Santa, MD [facility]  03/29/21 Office Visit Gillis Santa, MD [facility]  Showing recent visits within past 90 days and meeting all other requirements Today's Visits Date Type Provider Dept  06/27/21 Procedure visit Gillis Santa, MD [facility]  Showing today's visits and meeting all other requirements Future Appointments Date  Type Provider Dept  07/28/21 Appointment Gillis Santa, MD [facility]  08/23/21 Appointment Gillis Santa, MD [facility]  Showing future appointments within next 90  days and meeting all other requirements  Disposition: Discharge home  Discharge (Date  Time): 06/27/2021; 1020 hrs.   Primary Care Physician: Olin Hauser, DO Location: [facility] Note by: Gillis Santa, MD Date: 06/27/2021; Time: 10:51 AM  Disclaimer:  Medicine is not an exact science. The only guarantee in medicine is that nothing is guaranteed. It is important to note that the decision to proceed with this intervention was based on the information collected from the patient. The Data and conclusions were drawn from the patient's questionnaire, the interview, and the physical examination. Because the information was provided in large part by the patient, it cannot be guaranteed that it has not been purposely or unconsciously manipulated. Every effort has been made to obtain as much relevant data as possible for this evaluation. It is important to note that the conclusions that lead to this procedure are derived in large part from the available data. Always take into account that the treatment will also be dependent on availability of resources and existing treatment guidelines, considered by other Pain Management Practitioners as being common knowledge and practice, at the time of the intervention. For Medico-Legal purposes, it is also important to point out that variation in procedural techniques and pharmacological choices are the acceptable norm. The indications, contraindications, technique, and results of the above procedure should only be interpreted and judged by a Board-Certified Interventional Pain Specialist with extensive familiarity and expertise in the same exact procedure and technique.

## 2021-06-27 NOTE — Progress Notes (Signed)
Safety precautions to be maintained throughout the outpatient stay will include: orient to surroundings, keep bed in low position, maintain call bell within reach at all times, provide assistance with transfer out of bed and ambulation.  

## 2021-06-27 NOTE — Patient Instructions (Signed)

## 2021-06-27 NOTE — Progress Notes (Signed)
PROVIDER NOTE: Interpretation of information contained herein should be left to medically-trained personnel. Specific patient instructions are provided elsewhere under "Patient Instructions" section of medical record. This document was created in part using STT-dictation technology, any transcriptional errors that may result from this process are unintentional.  Patient: [name] Type: Established DOB: [birth year] MRN: [id number] PCP: Olin Hauser, DO  Service: Procedure DOS: 06/27/2021 Setting: Ambulatory Location: Ambulatory outpatient facility Delivery: Face-to-face Provider: Gillis Santa, MD Specialty: Interventional Pain Management Specialty designation: 09 Location: Outpatient facility Ref. Prov.: Nobie Putnam *    Primary Reason for Visit: Interventional Pain Management Treatment. CC: Bilateral shoulder pain    Procedure:          Anesthesia, Analgesia, Anxiolysis:  Type: Therapeutic Glenohumeral Joint (shoulder) Injection #2  Primary Purpose: Diagnostic Region: Superior Shoulder Area Level:  Shoulder Target Area: Glenohumeral Joint (shoulder) Approach: Anterior approach. Laterality: Bilateral  Anesthesia: Local (1-2% Lidocaine)  Anxiolysis: None  Sedation: None  Guidance: Fluoroscopy           Position: Supine   1. Localized osteoarthritis of shoulder regions, bilateral   2. Trigger finger of right hand, unspecified finger   3. Chronic pain of both shoulders   4. Chronic pain syndrome    NAS-11 Pain score:   Pre-procedure: 4 /10   Post-procedure: 2 /10      Pre-op H&P Assessment:  [name] is a 66 y.o. (year old), male patient, seen today for interventional treatment. He  has a past surgical history that includes Knee surgery; Gastric bypass (2002); prostate seeding; Carpal tunnel release (Right, 12/23/2014); Carpal tunnel release (Left, 01/06/2015); Colonoscopy with propofol (N/A, 10/08/2017); and Cardioversion (N/A, 05/15/2019). [name]  has a current medication list which includes the following prescription(s): acetaminophen, amiodarone, amlodipine, aspirin, benazepril, buspirone, cyanocobalamin, duloxetine, fluticasone, furosemide, hydrocodone-acetaminophen, [START ON 07/25/2021] hydrocodone-acetaminophen, ibuprofen, melatonin, metoprolol succinate, multivitamin with minerals, xarelto, trazodone, and diazepam. His primarily concern today is the No chief complaint on file.  Initial Vital Signs:  Pulse/HCG Rate:  (!) 56 ECG Heart Rate: (!) 50 Temp: 98 F (36.7 C) Resp: 16 BP: 134/84 SpO2: 98 %  BMI: Estimated body mass index is 43.94 kg/m as calculated from the following:   Height as of this encounter: 6' (1.829 m).   Weight as of this encounter: 324 lb (147 kg).  Risk Assessment: Allergies: Reviewed. He has No Known Allergies.  Allergy Precautions: None required Coagulopathies: Reviewed. None identified.  Blood-thinner therapy: None at this time Active Infection(s): Reviewed. None identified. [name] is afebrile  Site Confirmation: [name] to confirm the procedure and laterality before marking the site Procedure checklist: Completed Consent: Before the procedure and under the influence of no sedative(s), amnesic(s), or anxiolytics, the patient was informed of the treatment options, risks and possible complications. To fulfill our ethical and legal obligations, as recommended by the American Medical Association's Code of Ethics, I have informed the patient of my clinical impression; the nature and purpose of the treatment or procedure; the risks, benefits, and possible complications of the intervention; the alternatives, including doing nothing; the risk(s) and benefit(s) of the alternative treatment(s) or procedure(s); and the risk(s) and benefit(s) of doing nothing. The patient was provided information about the general risks and possible complications associated with the procedure. These may include, but  are not limited to: failure to achieve desired goals, infection, bleeding, organ or nerve damage, allergic reactions, paralysis, and death. In addition, the patient was informed of those risks and complications  associated to the procedure, such as failure to decrease pain; infection; bleeding; organ or nerve damage with subsequent damage to sensory, motor, and/or autonomic systems, resulting in permanent pain, numbness, and/or weakness of one or several areas of the body; allergic reactions; (i.e.: anaphylactic reaction); and/or death. Furthermore, the patient was informed of those risks and complications associated with the medications. These include, but are not limited to: allergic reactions (i.e.: anaphylactic or anaphylactoid reaction(s)); adrenal axis suppression; blood sugar elevation that in diabetics may result in ketoacidosis or comma; water retention that in patients with history of congestive heart failure may result in shortness of breath, pulmonary edema, and decompensation with resultant heart failure; weight gain; swelling or edema; medication-induced neural toxicity; particulate matter embolism and blood vessel occlusion with resultant organ, and/or nervous system infarction; and/or aseptic necrosis of one or more joints. Finally, the patient was informed that Medicine is not an exact science; therefore, there is also the possibility of unforeseen or unpredictable risks and/or possible complications that may result in a catastrophic outcome. The patient indicated having understood very clearly. We have given the patient no guarantees and we have made no promises. Enough time was given to the patient to ask questions, all of which were answered to the patient's satisfaction. [name] has indicated that he wanted to continue with the procedure. Attestation: I, the ordering provider, attest that I have discussed with the patient the benefits, risks, side-effects, alternatives, likelihood of  achieving goals, and potential problems during recovery for the procedure that I have provided informed consent. Date  Time: 06/27/2021  9:00 AM  Pre-Procedure Preparation:  Monitoring: As per clinic protocol. Respiration, ETCO2, SpO2, BP, heart rate and rhythm monitor placed and checked for adequate function Safety Precautions: Patient was assessed for positional comfort and pressure points before starting the procedure. Time-out: I initiated and conducted the "Time-out" before starting the procedure, as per protocol. The patient was Hill to participate by confirming the accuracy of the "Time Out" information. Verification of the correct person, site, and procedure were performed and confirmed by me, the nursing staff, and the patient. "Time-out" conducted as per Joint Commission's Universal Protocol (UP.01.01.01). Time: 1000  Description of Procedure:          Area Prepped: Entire shoulder Area DuraPrep (Iodine Povacrylex [0.7% available iodine] and Isopropyl Alcohol, 74% w/w) Safety Precautions: Aspiration looking for blood return was conducted prior to all injections. At no point did we inject any substances, as a needle was being advanced. No attempts were made at seeking any paresthesias. Safe injection practices and needle disposal techniques used. Medications properly checked for expiration dates. SDV (single dose vial) medications used. Description of the Procedure: Protocol guidelines were followed. The patient was placed in position over the procedure table. The target area was identified and the area prepped in the usual manner. Skin & deeper tissues infiltrated with local anesthetic. Appropriate amount of time allowed to pass for local anesthetics to take effect. The procedure needles were then advanced to the target area. Proper needle placement secured. Negative aspiration confirmed. Solution injected in intermittent fashion, asking for systemic symptoms every 0.5cc of injectate. The  needles were then removed and the area cleansed, making sure to leave some of the prepping solution back to take advantage of its long term bactericidal properties.         Vitals:   06/27/21 0907 06/27/21 1000 06/27/21 1005  BP: 134/84 125/84 133/86  Pulse: (!) 56    Resp: 16 18 18  Temp: 98 F (36.7 C)    TempSrc: Temporal    SpO2: 98% 96% 97%  Weight: (!) 324 lb (147 kg)    Height: 6' (1.829 m)      Start Time: 1000 hrs. End Time: 1005 hrs. Materials:  Needle(s) Type: Spinal Needle Gauge: 22G Length: 3.5-in Medication(s): Please see orders for medications and dosing details.  10 cc solution made of 9 cc of 0.2% ropivacaine, 1 cc of methylprednisolone, 80 mg/cc.  4 cc injected into each shoulder under fluoroscopy after contrast confirmation.  Imaging Guidance (Non-Spinal):          Type of Imaging Technique: Fluoroscopy Guidance (Non-Spinal) Indication(s): Assistance in needle guidance and placement for procedures requiring needle placement in or near specific anatomical locations not easily accessible without such assistance. Exposure Time: Please see nurses notes. Contrast: None used. Fluoroscopic Guidance: I was personally present during the use of fluoroscopy. "Tunnel Vision Technique" used to obtain the best possible view of the target area. Parallax error corrected before commencing the procedure. "Direction-depth-direction" technique used to introduce the needle under continuous pulsed fluoroscopy. Once target was reached, antero-posterior, oblique, and lateral fluoroscopic projection used confirm needle placement in all planes. Images permanently stored in EMR. Interpretation: No contrast injected. I personally interpreted the imaging intraoperatively. Adequate needle placement confirmed in multiple planes. Permanent images saved into the patient's record.  Post-operative Assessment:  Post-procedure Vital Signs:  Pulse/HCG Rate:  (!) 56 (!) 56 Temp:  98 F (36.7  C) Resp: 18 BP:  133/86 SpO2: 97 %  EBL: None  Complications: No immediate post-treatment complications observed by team, or reported by patient.  Note: The patient tolerated the entire procedure well. A repeat set of vitals were taken after the procedure and the patient was kept under observation following institutional policy, for this type of procedure. Post-procedural neurological assessment was performed, showing return to baseline, prior to discharge. The patient was provided with post-procedure discharge instructions, including a section on how to identify potential problems. Should any problems arise concerning this procedure, the patient was given instructions to immediately contact us, at any time, without hesitation. In any case, we plan to contact the patient by telephone for a follow-up status report regarding this interventional procedure.  Comments:  No additional relevant information.  Plan of Care  Mr. Rhody is complaining of increased buttock pain around his piriformis and SI joints with occasional radiation into his groin.  I will order SI joint x-ray and we will discuss treatment plan afterwards.   Orders:  Orders Placed This Encounter  Procedures   DG PAIN [facility] C-ARM 1-60 MIN NO REPORT    Intraoperative interpretation by procedural physician at [place].    Standing Status:   Standing    Number of Occurrences:   1    Order Specific Question:   Reason for exam:    Answer:   Assistance in needle guidance and placement for procedures requiring needle placement in or near specific anatomical locations not easily accessible without such assistance.   Chronic Opioid Analgesic:  Tramadol 50 mg 4 times daily as needed, quantity 120/month; MME equals 20    Medications ordered for procedure: Meds ordered this encounter  Medications   iohexol (OMNIPAQUE) 180 MG/ML injection 10 mL    Must be Myelogram-compatible. If not available, you may substitute with a  water-soluble, non-ionic, hypoallergenic, myelogram-compatible radiological contrast medium.   lidocaine (XYLOCAINE) 2 % (with pres) injection 400 mg   ropivacaine (PF) 2 mg/mL (0.2%) (  NAROPIN) injection 4 mL   methylPREDNISolone acetate (DEPO-MEDROL) injection 80 mg   Medications administered: We administered iohexol, lidocaine, ropivacaine (PF) 2 mg/mL (0.2%), and methylPREDNISolone acetate.  See the medical record for exact dosing, route, and time of administration.  Follow-up plan:   Return in about 5 weeks (around 08/01/2021).      Recent Visits Date Type Provider Dept  06/16/21 Office Visit Gillis Santa, MD [facility]  05/23/21 Office Visit Gillis Santa, MD [facility]  05/12/21 Office Visit Gillis Santa, MD [facility]  04/19/21 Office Visit Gillis Santa, MD [facility]  04/11/21 Procedure visit Gillis Santa, MD [facility]  03/29/21 Office Visit Gillis Santa, MD [facility]  Showing recent visits within past 90 days and meeting all other requirements Today's Visits Date Type Provider Dept  06/27/21 Procedure visit Gillis Santa, MD [facility]  Showing today's visits and meeting all other requirements Future Appointments Date Type Provider Dept  07/28/21 Appointment Gillis Santa, MD [facility]  08/23/21 Appointment Gillis Santa, MD [facility]  Showing future appointments within next 90 days and meeting all other requirements  Disposition: Discharge home  Discharge (Date  Time): 06/27/2021; 1020 hrs.   Primary Care Physician: Olin Hauser, DO Location: [facility] Note by: Gillis Santa, MD Date: 06/27/2021; Time: 10:52 AM  Disclaimer:  Medicine is not an exact science. The only guarantee in medicine is that nothing is guaranteed. It is important to note that the decision to proceed with this intervention was based on the  information collected from the patient. The Data and conclusions were drawn from the patient's questionnaire, the interview, and the physical examination. Because the information was provided in large part by the patient, it cannot be guaranteed that it has not been purposely or unconsciously manipulated. Every effort has been made to obtain as much relevant data as possible for this evaluation. It is important to note that the conclusions that lead to this procedure are derived in large part from the available data. Always take into account that the treatment will also be dependent on availability of resources and existing treatment guidelines, considered by other Pain Management Practitioners as being common knowledge and practice, at the time of the intervention. For Medico-Legal purposes, it is also important to point out that variation in procedural techniques and pharmacological choices are the acceptable norm. The indications, contraindications, technique, and results of the above procedure should only be interpreted and judged by a Board-Certified Interventional Pain Specialist with extensive familiarity and expertise in the same exact procedure and technique.

## 2021-06-27 NOTE — Progress Notes (Signed)
PROVIDER NOTE: Interpretation of information contained herein should be left to medically-trained personnel. Specific patient instructions are provided elsewhere under "Patient Instructions" section of medical record. This document was created in part using STT-dictation technology, any transcriptional errors that may result from this process are unintentional.  Patient: [name] Type: Established DOB: 1955/09/19 MRN: [id number] PCP: Olin Hauser, DO  Service: Procedure DOS: 06/27/2021 Setting: Ambulatory Location: Ambulatory outpatient facility Delivery: Face-to-face Provider: Gillis Santa, MD Specialty: Interventional Pain Management Specialty designation: 09 Location: Outpatient facility Ref. Prov.: Nobie Putnam *    Primary Reason for Visit: Interventional Pain Management Treatment. CC: No chief complaint on file.    Procedure:          Anesthesia, Analgesia, Anxiolysis:  Type: Diagnostic Glenohumeral Joint (shoulder) Injection #2  Primary Purpose: Diagnostic Region: Superior Shoulder Area Level:  Shoulder Target Area: Glenohumeral Joint (shoulder) Approach: Anterior approach. Laterality: Right  Anesthesia: Local (1-2% Lidocaine)  Anxiolysis: None  Sedation: None  Guidance: Fluoroscopy           Position: Supine   1. Localized osteoarthritis of shoulder regions, bilateral   2. Trigger finger of right hand, unspecified finger   3. Chronic pain of both shoulders   4. Chronic pain syndrome    NAS-11 Pain score:   Pre-procedure: 4 /10   Post-procedure: 4 /10      Pre-op H&P Assessment:  [name] is a 66 y.o. (year old), male patient, seen today for interventional treatment. He  has a past surgical history that includes Knee surgery; Gastric bypass (2002); prostate seeding; Carpal tunnel release (Right, 12/23/2014); Carpal tunnel release (Left, 01/06/2015); Colonoscopy with propofol (N/A, 10/08/2017); and Cardioversion (N/A, 05/15/2019). [name]  has a current medication list which includes the following prescription(s): acetaminophen, amiodarone, amlodipine, aspirin, benazepril, buspirone, cyanocobalamin, duloxetine, fluticasone, furosemide, hydrocodone-acetaminophen, [START ON 07/25/2021] hydrocodone-acetaminophen, ibuprofen, melatonin, metoprolol succinate, multivitamin with minerals, xarelto, trazodone, and diazepam. His primarily concern today is the No chief complaint on file.  Initial Vital Signs:  Pulse/HCG Rate:  (!) 56   Temp: 98 F (36.7 C) Resp: 16 BP: 134/84 SpO2: 98 %  BMI: Estimated body mass index is 43.94 kg/m as calculated from the following:   Height as of this encounter: 6' (1.829 m).   Weight as of this encounter: 324 lb (147 kg).  Risk Assessment: Allergies: Reviewed. He has No Known Allergies.  Allergy Precautions: None required Coagulopathies: Reviewed. None identified.  Blood-thinner therapy: None at this time Active Infection(s): Reviewed. None identified. [name] is afebrile  Site Confirmation: [name] was asked to confirm the procedure and laterality before marking the site Procedure checklist: Completed Consent: Before the procedure and under the influence of no sedative(s), amnesic(s), or anxiolytics, the patient was informed of the treatment options, risks and possible complications. To fulfill our ethical and legal obligations, as recommended by the American Medical Association's Code of Ethics, I have informed the patient of my clinical impression; the nature and purpose of the treatment or procedure; the risks, benefits, and possible complications of the intervention; the alternatives, including doing nothing; the risk(s) and benefit(s) of the alternative treatment(s) or procedure(s); and the risk(s) and benefit(s) of doing nothing. The patient was provided information about the general risks and possible complications associated with the procedure. These may include, but are not limited to:  failure to achieve desired goals, infection, bleeding, organ or nerve damage, allergic reactions, paralysis, and death. In addition, the patient was informed of those risks and complications associated  to the procedure, such as failure to decrease pain; infection; bleeding; organ or nerve damage with subsequent damage to sensory, motor, and/or autonomic systems, resulting in permanent pain, numbness, and/or weakness of one or several areas of the body; allergic reactions; (i.e.: anaphylactic reaction); and/or death. Furthermore, the patient was informed of those risks and complications associated with the medications. These include, but are not limited to: allergic reactions (i.e.: anaphylactic or anaphylactoid reaction(s)); adrenal axis suppression; blood sugar elevation that in diabetics may result in ketoacidosis or comma; water retention that in patients with history of congestive heart failure may result in shortness of breath, pulmonary edema, and decompensation with resultant heart failure; weight gain; swelling or edema; medication-induced neural toxicity; particulate matter embolism and blood vessel occlusion with resultant organ, and/or nervous system infarction; and/or aseptic necrosis of one or more joints. Finally, the patient was informed that Medicine is not an exact science; therefore, there is also the possibility of unforeseen or unpredictable risks and/or possible complications that may result in a catastrophic outcome. The patient indicated having understood very clearly. We have given the patient no guarantees and we have made no promises. Enough time was given to the patient to ask questions, all of which were answered to the patient's satisfaction. [name] has indicated that he wanted to continue with the procedure. Attestation: I, the ordering provider, attest that I have discussed with the patient the benefits, risks, side-effects, alternatives, likelihood of achieving goals, and  potential problems during recovery for the procedure that I have provided informed consent. Date  Time: 06/27/2021  9:00 AM  Pre-Procedure Preparation:  Monitoring: As per clinic protocol. Respiration, ETCO2, SpO2, BP, heart rate and rhythm monitor placed and checked for adequate function Safety Precautions: Patient was assessed for positional comfort and pressure points before starting the procedure. Time-out: I initiated and conducted the "Time-out" before starting the procedure, as per protocol. The patient was asked to participate by confirming the accuracy of the "Time Out" information. Verification of the correct person, site, and procedure were performed and confirmed by me, the nursing staff, and the patient. "Time-out" conducted as per Joint Commission's Universal Protocol (UP.01.01.01). Time:    Description of Procedure:          Area Prepped: Entire shoulder Area DuraPrep (Iodine Povacrylex [0.7% available iodine] and Isopropyl Alcohol, 74% w/w) Safety Precautions: Aspiration looking for blood return was conducted prior to all injections. At no point did we inject any substances, as a needle was being advanced. No attempts were made at seeking any paresthesias. Safe injection practices and needle disposal techniques used. Medications properly checked for expiration dates. SDV (single dose vial) medications used. Description of the Procedure: Protocol guidelines were followed. The patient was placed in position over the procedure table. The target area was identified and the area prepped in the usual manner. Skin & deeper tissues infiltrated with local anesthetic. Appropriate amount of time allowed to pass for local anesthetics to take effect. The procedure needles were then advanced to the target area. Proper needle placement secured. Negative aspiration confirmed. Solution injected in intermittent fashion, asking for systemic symptoms every 0.5cc of injectate. The needles were then removed  and the area cleansed, making sure to leave some of the prepping solution back to take advantage of its long term bactericidal properties.         Vitals:   06/27/21 0907  BP: 134/84  Pulse: (!) 56  Resp: 16  Temp: 98 F (36.7 C)  TempSrc: Temporal  SpO2: 98%  Weight: (!) 324 lb (147 kg)  Height: 6' (1.829 m)    Start Time:   hrs. End Time:   hrs. Materials:  Needle(s) Type: Spinal Needle Gauge: 22G Length: 3.5-in Medication(s): Please see orders for medications and dosing details.  10 cc solution made of 9 cc of 0.2% ropivacaine, 1 cc of methylprednisolone, 80 mg/cc.  5 cc injected into each shoulder under fluoroscopy after contrast confirmation.  Imaging Guidance (Non-Spinal):          Type of Imaging Technique: Fluoroscopy Guidance (Non-Spinal) Indication(s): Assistance in needle guidance and placement for procedures requiring needle placement in or near specific anatomical locations not easily accessible without such assistance. Exposure Time: Please see nurses notes. Contrast: None used. Fluoroscopic Guidance: I was personally present during the use of fluoroscopy. "Tunnel Vision Technique" used to obtain the best possible view of the target area. Parallax error corrected before commencing the procedure. "Direction-depth-direction" technique used to introduce the needle under continuous pulsed fluoroscopy. Once target was reached, antero-posterior, oblique, and lateral fluoroscopic projection used confirm needle placement in all planes. Images permanently stored in EMR. Interpretation: No contrast injected. I personally interpreted the imaging intraoperatively. Adequate needle placement confirmed in multiple planes. Permanent images saved into the patient's record.  Post-operative Assessment:  Post-procedure Vital Signs:  Pulse/HCG Rate:  (!) 56   Temp:  98 F (36.7 C) Resp: 16 BP:  134/84 SpO2: 98 %  EBL: None  Complications: No immediate post-treatment  complications observed by team, or reported by patient.  Note: The patient tolerated the entire procedure well. A repeat set of vitals were taken after the procedure and the patient was kept under observation following institutional policy, for this type of procedure. Post-procedural neurological assessment was performed, showing return to baseline, prior to discharge. The patient was provided with post-procedure discharge instructions, including a section on how to identify potential problems. Should any problems arise concerning this procedure, the patient was given instructions to immediately contact us, at any time, without hesitation. In any case, we plan to contact the patient by telephone for a follow-up status report regarding this interventional procedure.  Comments:  No additional relevant information.  Plan of Care  [name] is complaining of increased buttock pain around his piriformis and SI joints with occasional radiation into his groin.  I will order SI joint x-ray and we will discuss treatment plan afterwards.   Orders:  No orders of the defined types were placed in this encounter.  Chronic Opioid Analgesic:  Tramadol 50 mg 4 times daily as needed, quantity 120/month; MME equals 20    Medications ordered for procedure: No orders of the defined types were placed in this encounter.  Medications administered: Grier Rocher. Crock had no medications administered during this visit.  See the medical record for exact dosing, route, and time of administration.  Follow-up plan:   No follow-ups on file.       s/p lesi #1 (left L4/5) on 8/17, #2 on 10/21/2018 (left L3/4), #3 on 12/09/2018 (Left L3/4); 02/24/2019: Right wrist injection and left trigger finger (middle finger) injection.,  Bilateral wrist injection 10/15/2019, bilateral knee Hyalgan No. 1 01/26/1960. #2 02/23/2020, #3 03/24/2020.  Endorsing approximately 75 to 80% pain relief for bilateral knee pain.  Lumbar L3-5 RFA, Left: 02/16/21,  Right 03/09/21: 90% pain relief          Recent Visits Date Type Provider Dept  06/16/21 Office Visit Gillis Santa, MD [facility]  05/23/21 Office  Visit Gillis Santa, MD [facility]  05/12/21 Office Visit Gillis Santa, MD [facility]  04/19/21 Office Visit Gillis Santa, MD [facility]  04/11/21 Procedure visit Gillis Santa, MD [facility]  03/29/21 Office Visit Gillis Santa, MD [facility]  Showing recent visits within past 90 days and meeting all other requirements Today's Visits Date Type Provider Dept  06/27/21 Procedure visit Gillis Santa, MD [facility]  Showing today's visits and meeting all other requirements Future Appointments Date Type Provider Dept  08/23/21 Appointment Gillis Santa, MD [facility]  Showing future appointments within next 90 days and meeting all other requirements  Disposition: Discharge home  Discharge (Date  Time): 06/27/2021;   hrs.   Primary Care Physician: Olin Hauser, DO Location: [facility] Note by: Gillis Santa, MD Date: 06/27/2021; Time: 9:17 AM  Disclaimer:  Medicine is not an exact science. The only guarantee in medicine is that nothing is guaranteed. It is important to note that the decision to proceed with this intervention was based on the information collected from the patient. The Data and conclusions were drawn from the patient's questionnaire, the interview, and the physical examination. Because the information was provided in large part by the patient, it cannot be guaranteed that it has not been purposely or unconsciously manipulated. Every effort has been made to obtain as much relevant data as possible for this evaluation. It is important to note that the conclusions that lead to this procedure are derived in large part from the available data. Always take into account that the treatment will also be dependent  on availability of resources and existing treatment guidelines, considered by other Pain Management Practitioners as being common knowledge and practice, at the time of the intervention. For Medico-Legal purposes, it is also important to point out that variation in procedural techniques and pharmacological choices are the acceptable norm. The indications, contraindications, technique, and results of the above procedure should only be interpreted and judged by a Board-Certified Interventional Pain Specialist with extensive familiarity and expertise in the same exact procedure and technique.

## 2021-06-28 ENCOUNTER — Telehealth: Payer: Self-pay

## 2021-06-28 NOTE — Telephone Encounter (Signed)
Post procedure phone call. Patient states he is doing well.  

## 2021-06-29 ENCOUNTER — Telehealth: Payer: Self-pay | Admitting: Student in an Organized Health Care Education/Training Program

## 2021-06-29 ENCOUNTER — Other Ambulatory Visit: Payer: Self-pay | Admitting: *Deleted

## 2021-06-29 DIAGNOSIS — G894 Chronic pain syndrome: Secondary | ICD-10-CM

## 2021-06-29 DIAGNOSIS — M19011 Primary osteoarthritis, right shoulder: Secondary | ICD-10-CM

## 2021-06-29 MED ORDER — HYDROCODONE-ACETAMINOPHEN 7.5-325 MG PO TABS: 1.0000 | ORAL_TABLET | Freq: Three times a day (TID) | ORAL | 0 refills | Status: AC | PRN

## 2021-06-29 NOTE — Telephone Encounter (Signed)
Patient notified

## 2021-06-29 NOTE — Telephone Encounter (Signed)
Patient stated that the pharmacy is still out of meds. So patient will like to refill meds at [facility] in [place]. Patient stated they have it but are running low. Please give patient a call. Thanks

## 2021-06-29 NOTE — Telephone Encounter (Signed)
Rx request sent to Dr. Naveira. 

## 2021-06-30 ENCOUNTER — Ambulatory Visit: Payer: Medicare Other

## 2021-07-04 ENCOUNTER — Ambulatory Visit: Payer: Medicare Other

## 2021-07-04 DIAGNOSIS — M6281 Muscle weakness (generalized): Secondary | ICD-10-CM

## 2021-07-04 DIAGNOSIS — M25512 Pain in left shoulder: Secondary | ICD-10-CM | POA: Diagnosis not present

## 2021-07-04 DIAGNOSIS — M5459 Other low back pain: Secondary | ICD-10-CM | POA: Diagnosis not present

## 2021-07-04 DIAGNOSIS — G8929 Other chronic pain: Secondary | ICD-10-CM

## 2021-07-04 DIAGNOSIS — M25511 Pain in right shoulder: Secondary | ICD-10-CM | POA: Diagnosis not present

## 2021-07-04 NOTE — Therapy (Signed)
OUTPATIENT PHYSICAL THERAPY SHOULDER/BACK TREATMENT  Patient Name: [name] MRN: [id number] DOB:March 10, 1955, 66 y.o., male Today's Date: 07/04/2021   PT End of Session - 07/04/21 1156     Visit Number 17    Number of Visits 25    Date for PT Re-Evaluation 07/22/21    Authorization Type eval: 04/29/21    PT Start Time 1152    PT Stop Time 1230    PT Time Calculation (min) 38 min    Activity Tolerance Patient tolerated treatment well    Behavior During Therapy WFL for tasks assessed/performed             Past Medical History:  Diagnosis Date   Cardiomyopathy (in setting of Afib)    a. 12/2013 Echo: EF 45-50%, mild ant and antsept HK. mild MR. Mod dil LA. nl RV fxn. Rhythm was Afib.   DJD (degenerative joint disease) of knee    History of kidney stones    Intervertebral disc disorder with radiculopathy of lumbosacral region    Kidney stone 2012   Lower extremity edema    PAF (paroxysmal atrial fibrillation) (HCC)    Pernicious anemia    Prostate cancer ([name])    Pulmonary nodule, right    a. 10/2014 Cardiac CTA: 22mm RLL nodule; b. 04/2015 CT Chest: stable 78mm RLL nodule. No new nodules; 02/2017 CTA Chest: stable, benign, 24mm RLL pulm nodule.   SVT (supraventricular tachycardia) [name])    Past Surgical History:  Procedure Laterality Date   CARDIOVERSION N/A 05/15/2019   Procedure: CARDIOVERSION;  Surgeon: Minna Merritts, MD;  Location: [facility];  Service: Cardiovascular;  Laterality: N/A;   CARPAL TUNNEL RELEASE Right 12/23/2014   Procedure: CARPAL TUNNEL RELEASE;  Surgeon: Earnestine Leys, MD;  Location: [facility];  Service: Orthopedics;  Laterality: Right;   CARPAL TUNNEL RELEASE Left 01/06/2015   Procedure: CARPAL TUNNEL RELEASE;  Surgeon: Earnestine Leys, MD;  Location: [facility];  Service: Orthopedics;  Laterality: Left;   COLONOSCOPY WITH PROPOFOL N/A 10/08/2017   Procedure: COLONOSCOPY WITH PROPOFOL;  Surgeon: Lin Landsman, MD;  Location: [facility];  Service:  Gastroenterology;  Laterality: N/A;   GASTRIC BYPASS  2002   KNEE SURGERY     knee trauma   prostate seeding     Patient Active Problem List   Diagnosis Date Noted   Lesion of bone of lumbosacral spine (L5) 05/12/2021   Localized osteoarthritis of shoulder regions, bilateral 03/29/2021   Chronic pain of both shoulders 03/29/2021   Drug-induced myopathy 02/21/2021   Trigger finger of right hand 11/15/2020   Prostate cancer ([name]) 07/26/2020   Insomnia 08/01/2019   Primary osteoarthritis of both wrists 02/17/2019   Trigger middle finger of left hand 02/17/2019   Chronic pain syndrome 02/17/2019   Chronic radicular lumbar pain 10/08/2018   Lumbar radiculopathy 10/08/2018   Lumbar degenerative disc disease 10/08/2018   Lumbar facet arthropathy 10/08/2018   Lumbar facet joint syndrome 10/08/2018   Intervertebral disc disorder with radiculopathy of lumbosacral region    Pernicious anemia 05/22/2016   Paroxysmal atrial fibrillation ([name]) 12/20/2015   DJD (degenerative joint disease) of knee 10/27/2014   Bilateral carpal tunnel syndrome 10/27/2014   Morbid obesity with BMI of 50.0-59.9, adult ([place]) 10/27/2014   Mixed hyperlipidemia 10/27/2014   H/O gastric bypass 03/20/2014   Hyperkalemia 03/20/2014   History of prostate cancer 12/26/2013   Essential hypertension 12/26/2013   Encounter for anticoagulation discussion and counseling 12/26/2013    PCP: Wayne Hauser, DO  REFERRING PROVIDER: Nobie Hill *  REFERRING DIAGNOSIS: M19.011,M19.012 (ICD-10-CM) - Localized osteoarthritis of shoulder regions, bilateral, M65.30 (ICD-10-CM) - Trigger finger of right hand, unspecified finger, M25.511,G89.29,M25.512 (ICD-10-CM) - Chronic pain of both shoulders, M53.3 (ICD-10-CM) - Sacroiliac joint pain, G89.4 (ICD-10-CM) - Chronic pain syndrome  THERAPY DIAG: Other low back pain  Chronic left shoulder pain  Chronic right shoulder pain  Muscle weakness  (generalized)  ONSET DATE: 04/30/19  FOLLOW UP APPT WITH PROVIDER: Yes    From initial evaluation (04/29/21)  SUBJECTIVE:                                                                                                                                                                                         Chief Complaint: Bilateral shoulder pain  Pertinent History [name] is a 66 y.o. year old, male patient referred to physical therapy by pain management for bilateral shoulder pain. Pt reports chronic bilateral shoulder pain for approximately the last 2 years. The pain started in his R wrist which improved after an injection however the pain gradually started worsening and then moving up his R arm to his R shoulder. Eventually he started having pain in his L shoulder as well. He noticed the most significant pain when lifting his arms to wash under them while bathing. He had injections in both shoulders on March 27th with significant improvement in pain since that time. At this point pt states that he is not having any shoulder pain. He has a past medical history of Cardiomyopathy (in setting of Afib), DJD (degenerative joint disease) of knee, History of kidney stones, Intervertebral disc disorder with radiculopathy of lumbosacral region, Kidney stone (2012), Lower extremity edema, PAF (paroxysmal atrial fibrillation) ([name]), Pernicious anemia, Prostate cancer ([name]), Pulmonary nodule, right, and SVT (supraventricular tachycardia) ([name]). He also  has a past surgical history that includes Knee surgery; Gastric bypass (2002); prostate seeding; Carpal tunnel release (Right, 12/23/2014); Carpal tunnel release (Left, 01/06/2015); Colonoscopy with propofol (N/A, 10/08/2017); and Cardioversion (N/A, 05/15/2019). He has tapered off a lot of his medications over the last few months because he felt like a lot of his symptoms were coming from medication side effects. He has also noted that he started having  anxiety attacks since January which is new for him. Pt and his wife joined [employer] and he recently ordered an upright walker with forearm supports to help his mobility. Pt reports losing 41# in the last 2 years secondary to drop in his calorie intake.    Pain:  Pain Intensity: Present: 0/10, Best: 0/10, Worst: 0/10 (not currently hurting) Pain location: Bilateral shoulders Pain Quality: sharp  and aching  Radiating: No  Numbness/Tingling: Yes, bilateral hand numbness occasionally as well as bilateral foot numbness.  Focal Weakness: No, however pt has noticed weakness in both shoulders and hands. Aggravating factors: Lifting arms; Relieving factors: injections, pain medication, no benefit with heat/topical creams, unable to take NSAIDs secondary to taking Xarelto 24-hour pain behavior: Constant pain all the time (not currently); History of prior shoulder or neck injury, pain, surgery, or therapy: No Falls: Has patient fallen in last 6 months? No,  Dominant hand: right Imaging: Yes, R shoulder radiographs (03/29/21): Mild glenohumeral and mild acromioclavicular osteoarthritis; L shoulder radiographs (03/29/21):  Mild-to-moderate glenohumeral and mild acromioclavicular osteoarthritis. 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 Otherwise negative  Precautions: None  Weight Bearing Restrictions: No  Living Environment Lives with: lives with their spouse Lives in: House/apartment  Patient Goals: Improve shoulder strength and prevent pain from returning. Pt would also like to address his back  pain/sciatica;   OBJECTIVE:   Patient Surveys  FOTO: 50, predicted improvement to 52  Cognition Patient is oriented to person, place, and time.  Recent memory is intact.  Remote memory is intact.  Attention span and concentration are intact.  Expressive speech is intact.  Patient's fund of knowledge is within normal limits for educational level.   Gross Musculoskeletal Assessment Tremor: None Bulk: Normal Tone: Normal  Gait Deferred  Posture Forward head and rounded shoulders in both sitting and standing;  Cervical Screen AROM: WFL and painless with overpressure in all planes Spurlings A (ipsilateral lateral flexion/axial compression): R: Negative L: Negative Spurlings B (ipsilateral lateral flexion/contralateral rotation/axial compression): R: Negative L: Negative Repeated movement: Not tested Hoffman Sign (cervical cord compression): R: Positive L: Negative ULTT Median: R: Not examined L: Not examined ULTT Ulnar: R: Not examined L: Not examined ULTT Radial: R: Not examined L: Not examined   AROM  AROM (Normal range in degrees) AROM 07/04/2021  Cervical  Flexion (50) WNL  Extension (80) WNL  Right lateral flexion (45) WNL  Left lateral flexion (45) WNL  Right rotation (85) WNL  Left rotation (85) WNL     Right Left  Shoulder    Flexion 177 170  Extension    Abduction 155 146  External Rotation 90 90  Internal Rotation 70 70    Elbow    Flexion WNL WNL  Extension WNL WNL  Pronation WNL WNL  Supination WNL WNL  (* = pain; Blank rows = not tested)   LE MMT:  MMT (out of 5) Right 07/04/2021 Left 07/04/2021  Cervical (isometric)  Flexion   Extension   Lateral Flexion    Rotation        Shoulder   Flexion 4+ 5  Extension    Abduction 5 5*  External rotation (seated) 5 5  Internal rotation (seated) 5 5  Horizontal abduction    Horizontal adduction    Lower Trapezius    Rhomboids        Elbow  Flexion 5 5  Extension 5 5  Pronation 5 5   Supination 5 5      Wrist  Flexion 5 5  Extension 5 5  Radial deviation 5 5  Ulnar deviation 5 5  MCP  Flexion    Extension    Abduction 5 5  Adduction 5 5  (* = pain; Blank rows = not tested)  Sensation Deferred  Reflexes Deferred   Palpation No pain with palpation to anterior, lateral, and posterior shoulders bilaterally;   Passive Accessory Intervertebral Motion Deferred    Accessory Motions/Glides Glenohumeral: Posterior: R: abnormal (hypomobile) L: abnormal (hypomobile) Inferior: R: not examined L: not examined Anterior: R: not examined L: not examined  Acromioclavicular:  Posterior: R: not examined L: not examined Anterior: R: not examined L: not examined  Sternoclavicular: Posterior: R: not examined L: not examined Anterior: R: not examined L: not examined Superior: R: not examined L: not examined Inferior: R: not examined L: not examined  Scapulothoracic: Distraction: R: not examined L: not examined Medial: R: not examined L: not examined Lateral: R: not examined L: not examined Inferior: R: not examined L: not examined Superior: R: not examined L: not examined  Muscle Length Testing Deferred    SPECIAL TESTS  Rotator Cuff  Drop Arm Test: Negative Painful Arc (Pain from 60 to 120 degrees scaption): Negative Infraspinatus Muscle Test: Negative  Subacromial Impingement Hawkins-Kennedy: R: Negative L: Negative Neer (Block scapula, PROM flexion): R: Negative L: Negative Painful Arc (Pain from 60 to 120 degrees scaption): R: Negative L: Negative Empty Can: R: Not tested L: Not tested External Rotation Resistance: R: Negative L: Negative Horizontal Adduction: Not examined Scapular Assist: R: Not tested L: Not tested  Labral Tear Biceps Load II (120 elevation, full ER, 90 elbow flexion, full supination, resisted elbow flexion): R: Negative L: Negative Crank (160 scaption, axial load with IR/ER): R: Positive L: Negative Active  Compression Test: R: Not tested L: Not tested  Bicep Tendon Pathology Speed (shoulder flexion to 90, external rotation, full elbow extension, and forearm supination with resistance: Not examined Yergason's (resisted shoulder ER and supination/biceps tendon pathology): R: Positive L: Positive  Shoulder Instability Sulcus Sign: R: Not tested L: Not tested Anterior Apprehension: R: Negative L: Negative  Beighton scale: Deferred   Back Assessment 05/04/21  Pertinent Back Pain History Mr. ELENA COTHERN is a 66 y.o. year old, male patient referred to physical therapy by pain management for bilateral shoulder pain. He also reports a history of chronic low back pain. He has had intermittent occasional acute flares of back pain for decades. However pain worsened around 2017 when he noticed LLE sciatica and started having LLE weakness/buckling. He saw a chiropractor in [place] who thought it was better to send him for [facility] which significantly helped the pain. He traveled to [place] and while he was there his pain worsened. When he returned he had another ESI which improved his pain however it eventually returned.  For the last two years that he was working he could hardly walk due to the pain. He saw Dr. Sabra Heck (ortho) who referred him to Dr. Holley Raring with pain management. Pt has had lumbar injections and the pain improves with injections however it return after about a month. He had a radiofrequency ablation for his L lumbar spine in early February and R side of his lumbar spine in late February. Once he had the procedure on the R side he had complete resolution of his pain however it has gradually returned on the L side. Pain radiates into his L buttock down to the L knee. He is currently on a course prednisone.   Pain:  Pain Intensity: Present: 2/10, Best: 0/10, Worst: 10/10 Pain location: Central low  back spreading out to the left side; Pain Quality: aching, deep Radiating: Yes, into the L buttock and  down the L posterior thigh to the knee  Numbness/Tingling: Yes, numbness/tingling into L calf and in L foot Focal Weakness: Yes, occasional feeling of bilateral LE weakness but not as bad as it had been previously Aggravating factors: walking, sometimes starts spontaneously, extended sitting in the evening,  Relieving factors: pain medications (takes infrequently), sometimes laying down helps, heat (pt unsure if its the heat or laying down that actually helps) 24-hour pain behavior: Better in the AM, worse as the day progresses; Imaging: Lumbar radiograph 12/08/20: FINDINGS: 13 mm anterolisthesis L5 on S1. 5 mm retrolisthesis L4 on L5. 7 mm retrolisthesis L2 on L3. Vertebral body heights are maintained. There is mild scoliosis of the lumbar spine. Multilevel degenerative change with marked disc space narrowing and osteophyte at L4-L5, mild degenerative changes at T12-L1, L1-L2 and L2-L3. Facet degenerative changes at multiple levels. Aortic atherosclerosis. With flexion, grossly stable 13 mm anterolisthesis L5 on S1 and retrolisthesis L4 on L5. Retrolisthesis at L2-L3 slightly diminishes to 4 mm. With extension, similar 13 mm anterolisthesis L5 on S1 and similar retrolisthesis L4 on L5. 8 mm retrolisthesis L2 on L3.  Lumbar MRI 09/12/18: (see entire report however significant findings below): Baseline narrowing of the vertebral canal due to congenitally short pedicles, degenerative changes thorughout the lumbar spine including moderate to severe left neuroforaminal narrowing at L4-5, bilateral mildly diastatic pars defects trace anterolisthesis L5 on S1, mild R facet edema at L4-5 and mild periarticular soft tissue edema at L4-5 bilaterally, mild R facet edema at L5-S1, sclerotic lesion in the L2 vertebral body and the left aspect of the S2 body. Given the patient's history of prostate cancer, sequela of prostate metastasis cannot be excluded.  History of prior back injury, pain, surgery, or therapy: Yes,  Roseanne Reno PT years ago for back pain, has had chiropractic care as well  Red flags: History of prostate cancer, progressive weight loss over the last couple years. Denies bowel/bladder changes, saddle paresthesia, h/o spinal tumors, h/o compression fx, h/o abdominal aneurysm, abdominal pain, chills/fever, night sweats, nausea, vomiting;  Precautions: None  Weight Bearing Restrictions: No   Patient Goals: Decrease back pain and improve his ability to be active and exercise;   OBJECTIVE:   Cognition Patient is oriented to person, place, and time.  Recent memory is intact.  Remote memory is intact.  Attention span and concentration are intact.  Expressive speech is intact.  Patient's fund of knowledge is within normal limits for educational level.     Gross Musculoskeletal Assessment Tremor: None Bulk: Normal Tone: Normal No visible step-off along spinal column;  GAIT: Deferred  Posture: Lumbar lordosis: WNL Iliac crest height: Equal bilaterally Lumbar lateral shift: Negative History of scoliosis reported on imaging but not evident during physical exam today  AROM  AROM (Normal range in degrees) AROM  07/04/2021  Lumbar   Flexion (65) WNL  Extension (30) Mild deficit  Right lateral flexion (25) Mild deficit  Left lateral flexion (25) Mild deficit  Right rotation (30) Mild deficit  Left rotation (30) Mild deficit      Hip Right Left  Flexion (125) WNL WNL  Extension (15) Moderate limitation Moderate limitation  Abduction (40)    Adduction     Internal Rotation (45) >30 degrees >30 degrees  External Rotation (45) >30 degrees >30 degrees       Knee    Flexion (135) WNL WNL  Extension (0) WNL WNL      Ankle    Dorsiflexion (20)    Plantarflexion (50)    Inversion (35)    Eversion (15    (* = pain; Blank rows = not tested)   LE MMT:  MMT (out of 5) Right 07/04/2021 Left 07/04/2021  Hip flexion 5 5  Hip extension    Hip abduction (seated) 5 5  Hip  adduction (seated) 5 5  Hip internal rotation 5 5  Hip external rotation 5 5  Knee flexion 5 5  Knee extension 5 5  Ankle dorsiflexion 5 5  Ankle plantarflexion    Ankle inversion    Ankle eversion    (* = pain; Blank rows = not tested)   Sensation Grossly intact to light touch bilateral LEs as determined by testing dermatomes L2-S2. Proprioception, and hot/cold testing deferred on this date.   Reflexes Deferred   Muscle Length Hamstrings: R: Positive for limitation around 70 degrees L: Positive for limitation around 70 degrees Ely (quadriceps): R: Not examined L: Not examined Thomas (hip flexors): R: Not examined L: Not examined Ober: R: Not examined L: Not examined   Palpation  Location LEFT  RIGHT           Lumbar paraspinals 1 1  Quadratus Lumborum    Gluteus Maximus 0 0  Gluteus Medius 0 0  Deep hip external rotators 1 0  PSIS 0 0  Fortin's Area (SIJ) 0 0  Greater Trochanter 0 0  (Blank rows = not tested) Graded on 0-4 scale (0 = no pain, 1 = pain, 2 = pain with wincing/grimacing/flinching, 3 = pain with withdrawal, 4 = unwilling to allow palpation), (Blank rows = not tested)   Passive Accessory Intervertebral Motion Pt reports mild back pain with CPA L3-L5 and UPA bilaterally L3-L5. Generally, hypomobile throughout    SPECIAL TESTS Lumbar Radiculopathy and Discogenic: Centralization and Peripheralization (SN 92, -LR 0.12): Not examined Slump (SN 83, -LR 0.32): R: Negative L: Negative SLR (SN 92, -LR 0.29): R: Negative L:  Negative Crossed SLR (SP 90): R: Negative L: Negative  Facet Joint: Extension-Rotation (SN 100, -LR 0.0): R: Negative L: Negative  Lumbar Foraminal Stenosis: Lumbar quadrant (SN 70): R: Negative L: Negative  Hip: FABER (SN 81): R: Negative L: Negative FADIR (SN 94): R: Negative L: Negative Hip scour (SN 50): R: Negative L: Negative  SIJ:  Thigh Thrust (SN 88, -LR 0.18) : R: Not examined L: Not examined  Piriformis  Syndrome: FAIR Test (SN 88, SP 83): R: Not examined L: Not examined  Functional Tasks Deferred  Beighton scale: Deferred   TODAY'S TREATMENT  SUBJECTIVE: Pt reports that he saw Dr. Holley Raring who performed steroid injections to both his shoulders as well as his trigger finger. He has noticed some improvement in his trigger finger but his shoulders have not improved and if anything they have been worse. He has noticed some slight improvement in his back pain and also reports that he was able to get down on the floor and back up without assistance which was encouraging. No specific questions currently.   PAIN: Bilateral shoulder pain, not rated;   Ther-ex  All exercises performed on incline; Shoulder flexion 4# dumbbell (DB) in LUE, 1# DB in RUE 5 BUE, discontinued secondary to pain; Bicep curls with 5# DB in BUE 2 x 20 BUE; Chest press with cane and manual resistance from therapist 2 x 20; Rows with cane and manual resistance from therapist 2  x 20; Clams with manual resistance from therapist 2 x 15 BLE; Adductor squeezes with manual resistance from therapist 2 x 15 BLE;  SLR 2 x 10 RLE, 2 x 15 LLE; Heel slides with manually resisted extension 2 x 20 BLE; Partial bridges 2 x 10; Manually resisted tricep extension with cane x 10 BUE;   Not performed today: NuStep (seat 12, arms 11) L2 BUE/BLE x 5 minutes for warm-up during interval history Double leg heel raises 2 x 10 Seated Nautilus rows 30# x 10, 40# 2 x 10; Seated Nautilus chest press 40# 3 x 10; Seated Pallof press with green tband 2 x 10 toward each side; Seated shoulder "Ws" with green tband resistance x 10; Seated shoulder IR with green tband resistance x 10, x 15 RLE, 2 x 15 LUE; Mini squats 2 x 10; Standing exercises with 3# ankle weights: Hip flexion marches 2 x 10 BLE; Hip abduction 2 x 10 BLE; Hamstring curls 2 x 10 BLE; Hip extension 2 x 10 BLE; Shoulder scaption 4# dumbbell (DB) in LUE, 1# DB in RUE 2 x 15  BUE; Incline overhead shoulder press with 4# DB in LUE, 1# DB in RUE 2 x 15;   PATIENT EDUCATION:  Education details: Pt educated throughout session about proper posture and technique with exercises. Improved exercise technique, movement at target joints, use of target muscles after min to mod verbal, visual, tactile cues Person educated: Patient Education method: Explanation Education comprehension: verbalized understanding and returned demonstration;   HOME EXERCISE PROGRAM: Access Code: [email or web address] URL: [email or web address] Date: 06/08/2021 Prepared by: Roxana Hires  Exercises - Single Arm Shoulder Flexion with Dumbbell  - 1 x daily - 3 x weekly - 2-3 sets - 10 reps - 2s hold - Single Arm Scaption with Dumbbell  - 1 x daily - 3 x weekly - 2-3 sets - 10 reps - 2s hold - Seated Shoulder Row with Anchored Resistance  - 1 x daily - 3 x weekly - 2-3 sets - 10 reps - 2s hold - Shoulder W - External Rotation with Resistance  - 1 x daily - 3 x weekly - 2-3 sets - 10 reps - 2s hold - Supine Active Straight Leg Raise  - 1 x daily - 7 x weekly - 2-3 sets - 10 reps - 2s hold - Seated March with Resistance  - 1 x daily - 7 x weekly - 2-3 sets - 10 reps - 2s hold - Seated Hip Abduction with Resistance  - 1 x daily - 7 x weekly - 2-3 sets - 10 reps - 2s hold - Seated Hip Adduction Isometrics with Ball  - 1 x daily - 7 x weekly - 2-3 sets - 10 reps - 2s hold - Mini Squat with Counter Support  - 1 x daily - 7 x weekly - 2-3 sets - 10 reps - 2s hold   ASSESSMENT:  CLINICAL IMPRESSION:  Continued upper quarter strengthening during session today as well as hip and abdominal exercises. Pt reports worsening bilateral shoulder pain with attempted flexion with light dumbbells so discontinued. He demonstrates excellent effort and motivation throughout session and he reports appropriate muscle fatigue. His shoulder pain appears worse currently compared to prior to his injections. Pt encouraged  to follow-up with PT as scheduled. He will benefit from PT services to address deficits in strength and pain in order to return to full function at home and with leisure activities.    REHAB POTENTIAL: Good  CLINICAL DECISION  MAKING: Stable/uncomplicated  EVALUATION COMPLEXITY: Low   GOALS: Goals reviewed with patient? No  SHORT TERM GOALS: Target date: 08/15/2021  Pt will be independent with HEP to improve strength and decrease shoulder and back pain to improve pain-free function at home and work. Baseline:  Goal status: ONGOING   LONG TERM GOALS: Target date: 07/22/2021  Pt will increase shoulder FOTO to at least 62 to demonstrate significant improvement in function at home and work related to shoulder pain  Baseline: 04/29/21: 50, 05/30/21: 57 Goal status: PARTIALLY MET  2.  Pt will report at least 75% improvement in shoulder symptoms with all activities and feel confident with gym-based exercise program in order to maintain pain-free shoulder function. Baseline: 05/30/21: Pt reports minimal shoulder pain since starting therapy until this weekend with new injury Goal status: ACHIEVED  3. Pt will decrease mODI scoreby at least 13 points in order demonstrate clinically significant reduction in pain/disability related to his back pain Baseline: 05/30/21: 72% Goal status: INITIAL  4. Pt will decrease worst back pain as reported on NPRS by at least 2 points in order to demonstrate clinically significant reduction in back pain.  Baseline: 05/30/21: worst: 10/10 Goal status: INITIAL    PLAN: PT FREQUENCY: 2x/week  PT DURATION: 12 weeks  PLANNED INTERVENTIONS: Therapeutic exercises, Therapeutic activity, Neuromuscular re-education, Balance training, Gait training, Patient/Family education, Joint mobilization, Vestibular training, Canalith repositioning, Aquatic Therapy, Dry Needling, Cognitive remediation, Electrical stimulation, Spinal manipulation, Spinal mobilization, Cryotherapy,  Moist heat, Traction, Ultrasound, Ionotophoresis 4mg /ml Dexamethasone, and Manual therapy  PLAN FOR NEXT SESSION: Continue strengthening for shoulders and back, review and modify HEP as needed,  Lyndel Safe Rathana Viveros PT, DPT, GCS  Augustus Zurawski 07/04/2021, 2:17 PM

## 2021-07-18 ENCOUNTER — Ambulatory Visit: Payer: Medicare Other | Attending: Family Medicine

## 2021-07-18 DIAGNOSIS — G8929 Other chronic pain: Secondary | ICD-10-CM | POA: Insufficient documentation

## 2021-07-18 DIAGNOSIS — M6281 Muscle weakness (generalized): Secondary | ICD-10-CM | POA: Insufficient documentation

## 2021-07-18 DIAGNOSIS — M5459 Other low back pain: Secondary | ICD-10-CM | POA: Insufficient documentation

## 2021-07-18 DIAGNOSIS — M25511 Pain in right shoulder: Secondary | ICD-10-CM | POA: Diagnosis not present

## 2021-07-18 DIAGNOSIS — M25512 Pain in left shoulder: Secondary | ICD-10-CM | POA: Diagnosis not present

## 2021-07-18 NOTE — Therapy (Signed)
OUTPATIENT PHYSICAL THERAPY SHOULDER/BACK TREATMENT  Patient Name: [name] MRN: [id number] DOB:[birth year], 66 y.o., male Today's Date: 07/18/2021   PT End of Session - 07/18/21 1148     Visit Number 18    Number of Visits 25    Date for PT Re-Evaluation 07/22/21    Authorization Type eval: 04/29/21    PT Start Time 1147    PT Stop Time 1230    PT Time Calculation (min) 43 min    Activity Tolerance Patient tolerated treatment well    Behavior During Therapy WFL for tasks assessed/performed             Past Medical History:  Diagnosis Date   Cardiomyopathy (in setting of Afib)    a. 12/2013 Echo: EF 45-50%, mild ant and antsept HK. mild MR. Mod dil LA. nl RV fxn. Rhythm was Afib.   DJD (degenerative joint disease) of knee    History of kidney stones    Intervertebral disc disorder with radiculopathy of lumbosacral region    Kidney stone 2012   Lower extremity edema    PAF (paroxysmal atrial fibrillation) (HCC)    Pernicious anemia    Prostate cancer ([place])    Pulmonary nodule, right    a. 10/2014 Cardiac CTA: 26m RLL nodule; b. 04/2015 CT Chest: stable 726mRLL nodule. No new nodules; 02/2017 CTA Chest: stable, benign, 33m59mLL pulm nodule.   [facility]  Past Surgical History:  Procedure Laterality Date   CARDIOVERSION N/A 05/15/2019   Procedure: CARDIOVERSION;  Surgeon: GolMinna MerrittsD;  Location: [facility] ORS;  Service: Cardiovascular;  Laterality: N/A;   CARPAL TUNNEL RELEASE Right 12/23/2014   Procedure: CARPAL TUNNEL RELEASE;  Surgeon: HowEarnestine LeysD;  Location: [facility] ORS;  Service: Orthopedics;  Laterality: Right;   CARPAL TUNNEL RELEASE Left 01/06/2015   Procedure: CARPAL TUNNEL RELEASE;  Surgeon: HowEarnestine LeysD;  Location: [facility] ORS;  Service: Orthopedics;  Laterality: Left;   COLONOSCOPY WITH PROPOFOL N/A 10/08/2017   Procedure: COLONOSCOPY WITH PROPOFOL;  Surgeon: VanLin LandsmanD;  [facility];  Service:  Gastroenterology;  Laterality: N/A;   GASTRIC BYPASS  2002   KNEE SURGERY     knee trauma   prostate seeding     Patient Active Problem List   Diagnosis Date Noted   Lesion of bone of lumbosacral spine (L5) 05/12/2021   Localized osteoarthritis of shoulder regions, bilateral 03/29/2021   Chronic pain of both shoulders 03/29/2021   Drug-induced myopathy 02/21/2021   Trigger finger of right hand 11/15/2020   Pro[place] cancer ([place]/11/2020   Insomnia 08/01/2019   Primary osteoarthritis of both wrists 02/17/2019   Trigger middle finger of left hand 02/17/2019   Chronic pain syndrome 02/17/2019   Chronic radicular lumbar pain 10/08/2018   Lumbar radiculopathy 10/08/2018   Lumbar degenerative disc disease 10/08/2018   Lumbar facet arthropathy 10/08/2018   Lumbar facet joint syndrome 10/08/2018   Intervertebral disc disorder with radiculopathy of lumbosacral region    Pernicious anemia 05/22/2016   Paroxysmal atrial fibrillation ([place]CC[place]2/04/2015   DJD (degenerative joint disease) of knee 10/27/2014   Bilateral carpal tunnel syndrome 10/27/2014   Morbid obesity with BMI of 50.0-59.9, adult ([place]CC[place]0/11/2014   Mixed hyperlipidemia 10/27/2014   H/O gastric bypass 03/20/2014   Hyperkalemia 03/20/2014   History of prostate cancer 12/26/2013   Essential hypertension 12/26/2013   Encounter for anticoagulation discussion and counseling 12/26/2013    PCP: KarOlin HauserO  REFERRING PROVIDER: Nobie Putnam *  REFERRING DIAGNOSIS: M19.011,M19.012 (ICD-10-CM) - Localized osteoarthritis of shoulder regions, bilateral, M65.30 (ICD-10-CM) - Trigger finger of right hand, unspecified finger, M25.511,G89.29,M25.512 (ICD-10-CM) - Chronic pain of both shoulders, M53.3 (ICD-10-CM) - Sacroiliac joint pain, G89.4 (ICD-10-CM) - Chronic pain syndrome  THERAPY DIAG: Other low back pain  Chronic left shoulder pain  Chronic right shoulder pain  Muscle weakness  (generalized)  ONSET DATE: 04/30/19  FOLLOW UP APPT WITH PROVIDER: Yes    From initial evaluation (04/29/21)  SUBJECTIVE:                                                                                                                                                                                         Chief Complaint: Bilateral shoulder pain  Pertinent History [name] is a 66 y.o. year old, male patient referred to physical therapy by pain management for bilateral shoulder pain. Pt reports chronic bilateral shoulder pain for approximately the last 2 years. The pain started in his R wrist which improved after an injection however the pain gradually started worsening and then moving up his R arm to his R shoulder. Eventually he started having pain in his L shoulder as well. He noticed the most significant pain when lifting his arms to wash under them while bathing. He had injections in both shoulders on March 27th with significant improvement in pain since that time. At this point pt states that he is not having any shoulder pain. He has a past medical history of Cardiomyopathy (in setting of Afib), DJD (degenerative joint disease) of knee, History of kidney stones, Intervertebral disc disorder with radiculopathy of lumbosacral region, Kidney stone (2012), Lower extremity edema, PAF (paroxysmal atrial fibrillation) ([place]), Pernicious anemia, Prostate cancer ([place]), Pulmonary nodule, right, and SVT (supraventricular tachycardia) ([place]). He also  has a past surgical history that includes Knee surgery; Gastric bypass (2002); prostate seeding; Carpal tunnel release (Right, 12/23/2014); Carpal tunnel release (Left, 01/06/2015); Colonoscopy with propofol (N/A, 10/08/2017); and Cardioversion (N/A, 05/15/2019). He has tapered off a lot of his medications over the last few months because he felt like a lot of his symptoms were coming from medication side effects. He has also noted that he started having  anxiety attacks since January which is new for him. Pt and his wife joined [employer] and he recently ordered an upright walker with forearm supports to help his mobility. Pt reports losing 41# in the last 2 years secondary to drop in his calorie intake.    Pain:  Pain Intensity: Present: 0/10, Best: 0/10, Worst: 0/10 (not currently hurting) Pain location: Bilateral shoulders Pain Quality: sharp  and aching  Radiating: No  Numbness/Tingling: Yes, bilateral hand numbness occasionally as well as bilateral foot numbness.  Focal Weakness: No, however pt has noticed weakness in both shoulders and hands. Aggravating factors: Lifting arms; Relieving factors: injections, pain medication, no benefit with heat/topical creams, unable to take NSAIDs secondary to taking Xarelto 24-hour pain behavior: Constant pain all the time (not currently); History of prior shoulder or neck injury, pain, surgery, or therapy: No Falls: Has patient fallen in last 6 months? No,  Dominant hand: right Imaging: Yes, R shoulder radiographs (03/29/21): Mild glenohumeral and mild acromioclavicular osteoarthritis; L shoulder radiographs (03/29/21):  Mild-to-moderate glenohumeral and mild acromioclavicular osteoarthritis. 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 Otherwise negative  Precautions: None  Weight Bearing Restrictions: No  Living Environment Lives with: lives with their spouse Lives in: House/apartment  Patient Goals: Improve shoulder strength and prevent pain from returning. Pt would also like to address his back  pain/sciatica;   OBJECTIVE:   Patient Surveys  FOTO: 79, predicted improvement to 54  Cognition Patient is oriented to person, place, and time.  Recent memory is intact.  Remote memory is intact.  Attention span and concentration are intact.  Expressive speech is intact.  Patient's fund of knowledge is within normal limits for educational level.   Gross Musculoskeletal Assessment Tremor: None Bulk: Normal Tone: Normal  Gait Deferred  Posture Forward head and rounded shoulders in both sitting and standing;  Cervical Screen AROM: WFL and painless with overpressure in all planes Spurlings A (ipsilateral lateral flexion/axial compression): R: Negative L: Negative Spurlings B (ipsilateral lateral flexion/contralateral rotation/axial compression): R: Negative L: Negative Repeated movement: Not tested Hoffman Sign (cervical cord compression): R: Positive L: Negative ULTT Median: R: Not examined L: Not examined ULTT Ulnar: R: Not examined L: Not examined ULTT Radial: R: Not examined L: Not examined   AROM  AROM (Normal range in degrees) AROM 07/18/2021  Cervical  Flexion (50) WNL  Extension (80) WNL  Right lateral flexion (45) WNL  Left lateral flexion (45) WNL  Right rotation (85) WNL  Left rotation (85) WNL     Right Left  Shoulder    Flexion 177 170  Extension    Abduction 155 146  External Rotation 90 90  Internal Rotation 70 70    Elbow    Flexion WNL WNL  Extension WNL WNL  Pronation WNL WNL  Supination WNL WNL  (* = pain; Blank rows = not tested)   LE MMT:  MMT (out of 5) Right 07/18/2021 Left 07/18/2021  Cervical (isometric)  Flexion   Extension   Lateral Flexion    Rotation        Shoulder   Flexion 4+ 5  Extension    Abduction 5 5*  External rotation (seated) 5 5  Internal rotation (seated) 5 5  Horizontal abduction    Horizontal adduction    Lower Trapezius    Rhomboids        Elbow  Flexion 5 5  Extension 5 5  Pronation 5 5   Supination 5 5      Wrist  Flexion 5 5  Extension 5 5  Radial deviation 5 5  Ulnar deviation 5 5  MCP  Flexion    Extension    Abduction 5 5  Adduction 5 5  (* = pain; Blank rows = not tested)  Sensation Deferred  Reflexes Deferred   Palpation No pain with palpation to anterior, lateral, and posterior shoulders bilaterally;   Passive Accessory Intervertebral Motion Deferred    Accessory Motions/Glides Glenohumeral: Posterior: R: abnormal (hypomobile) L: abnormal (hypomobile) Inferior: R: not examined L: not examined Anterior: R: not examined L: not examined  Acromioclavicular:  Posterior: R: not examined L: not examined Anterior: R: not examined L: not examined  Sternoclavicular: Posterior: R: not examined L: not examined Anterior: R: not examined L: not examined Superior: R: not examined L: not examined Inferior: R: not examined L: not examined  Scapulothoracic: Distraction: R: not examined L: not examined Medial: R: not examined L: not examined Lateral: R: not examined L: not examined Inferior: R: not examined L: not examined Superior: R: not examined L: not examined  Muscle Length Testing Deferred    SPECIAL TESTS  Rotator Cuff  Drop Arm Test: Negative Painful Arc (Pain from 60 to 120 degrees scaption): Negative Infraspinatus Muscle Test: Negative  Subacromial Impingement Hawkins-Kennedy: R: Negative L: Negative Neer (Block scapula, PROM flexion): R: Negative L: Negative Painful Arc (Pain from 60 to 120 degrees scaption): R: Negative L: Negative Empty Can: R: Not tested L: Not tested External Rotation Resistance: R: Negative L: Negative Horizontal Adduction: Not examined Scapular Assist: R: Not tested L: Not tested  Labral Tear Biceps Load II (120 elevation, full ER, 90 elbow flexion, full supination, resisted elbow flexion): R: Negative L: Negative Crank (160 scaption, axial load with IR/ER): R: Positive L: Negative Active  Compression Test: R: Not tested L: Not tested  Bicep Tendon Pathology Speed (shoulder flexion to 90, external rotation, full elbow extension, and forearm supination with resistance: Not examined Yergason's (resisted shoulder ER and supination/biceps tendon pathology): R: Positive L: Positive  Shoulder Instability Sulcus Sign: R: Not tested L: Not tested Anterior Apprehension: R: Negative L: Negative  Beighton scale: Deferred   Back Assessment 05/04/21  Pertinent Back Pain History [name] is a 66 y.o. year old, male patient referred to physical therapy by pain management for bilateral shoulder pain. He also reports a history of chronic low back pain. He has had intermittent occasional acute flares of back pain for decades. However pain worsened around 2017 when he noticed LLE sciatica and started having LLE weakness/buckling. He saw a chiropractor in [place] who thought it was better to send him for [facility] which significantly helped the pain. He traveled to [place] and while he was there his pain worsened. When he returned he had another ESI which improved his pain however it eventually returned.  For the last two years that he was working he could hardly walk due to the pain. He saw Dr. Sabra Heck (ortho) who referred him to Dr. Holley Raring with pain management. Pt has had lumbar injections and the pain improves with injections however it return after about a month. He had a radiofrequency ablation for his L lumbar spine in early February and R side of his lumbar spine in late February. Once he had the procedure on the R side he had complete resolution of his pain however it has gradually returned on the L side. Pain radiates into his L buttock down to the L knee. He is currently on a course prednisone.   Pain:  Pain Intensity: Present: 2/10, Best: 0/10, Worst: 10/10 Pain location: Central low  back spreading out to the left side; Pain Quality: aching, deep Radiating: Yes, into the L buttock and  down the L posterior thigh to the knee  Numbness/Tingling: Yes, numbness/tingling into L calf and in L foot Focal Weakness: Yes, occasional feeling of bilateral LE weakness but not as bad as it had been previously Aggravating factors: walking, sometimes starts spontaneously, extended sitting in the evening,  Relieving factors: pain medications (takes infrequently), sometimes laying down helps, heat (pt unsure if its the heat or laying down that actually helps) 24-hour pain behavior: Better in the AM, worse as the day progresses; Imaging: Lumbar radiograph 12/08/20: FINDINGS: 13 mm anterolisthesis L5 on S1. 5 mm retrolisthesis L4 on L5. 7 mm retrolisthesis L2 on L3. Vertebral body heights are maintained. There is mild scoliosis of the lumbar spine. Multilevel degenerative change with marked disc space narrowing and osteophyte at L4-L5, mild degenerative changes at T12-L1, L1-L2 and L2-L3. Facet degenerative changes at multiple levels. Aortic atherosclerosis. With flexion, grossly stable 13 mm anterolisthesis L5 on S1 and retrolisthesis L4 on L5. Retrolisthesis at L2-L3 slightly diminishes to 4 mm. With extension, similar 13 mm anterolisthesis L5 on S1 and similar retrolisthesis L4 on L5. 8 mm retrolisthesis L2 on L3.  Lumbar MRI 09/12/18: (see entire report however significant findings below): Baseline narrowing of the vertebral canal due to congenitally short pedicles, degenerative changes thorughout the lumbar spine including moderate to severe left neuroforaminal narrowing at L4-5, bilateral mildly diastatic pars defects trace anterolisthesis L5 on S1, mild R facet edema at L4-5 and mild periarticular soft tissue edema at L4-5 bilaterally, mild R facet edema at L5-S1, sclerotic lesion in the L2 vertebral body and the left aspect of the S2 body. Given the patient's history of prostate cancer, sequela of prostate metastasis cannot be excluded.  History of prior back injury, pain, surgery, or therapy: Yes,  Nicole Kindred PT years ago for back pain, has had chiropractic care as well  Red flags: History of prostate cancer, progressive weight loss over the last couple years. Denies bowel/bladder changes, saddle paresthesia, h/o spinal tumors, h/o compression fx, h/o abdominal aneurysm, abdominal pain, chills/fever, night sweats, nausea, vomiting;  Precautions: None  Weight Bearing Restrictions: No   Patient Goals: Decrease back pain and improve his ability to be active and exercise;   OBJECTIVE:   Cognition Patient is oriented to person, place, and time.  Recent memory is intact.  Remote memory is intact.  Attention span and concentration are intact.  Expressive speech is intact.  Patient's fund of knowledge is within normal limits for educational level.     Gross Musculoskeletal Assessment Tremor: None Bulk: Normal Tone: Normal No visible step-off along spinal column;  GAIT: Deferred  Posture: Lumbar lordosis: WNL Iliac crest height: Equal bilaterally Lumbar lateral shift: Negative History of scoliosis reported on imaging but not evident during physical exam today  AROM  AROM (Normal range in degrees) AROM  07/18/2021  Lumbar   Flexion (65) WNL  Extension (30) Mild deficit  Right lateral flexion (25) Mild deficit  Left lateral flexion (25) Mild deficit  Right rotation (30) Mild deficit  Left rotation (30) Mild deficit      Hip Right Left  Flexion (125) WNL WNL  Extension (15) Moderate limitation Moderate limitation  Abduction (40)    Adduction     Internal Rotation (45) >30 degrees >30 degrees  External Rotation (45) >30 degrees >30 degrees       Knee    Flexion (135) WNL WNL  Extension (0) WNL WNL      Ankle    Dorsiflexion (20)    Plantarflexion (50)    Inversion (35)    Eversion (15    (* = pain; Blank rows = not tested)   LE MMT:  MMT (out of 5) Right 07/18/2021 Left 07/18/2021  Hip flexion 5 5  Hip extension    Hip abduction (seated) 5 5  Hip adduction  (seated) 5 5  Hip internal rotation 5 5  Hip external rotation 5 5  Knee flexion 5 5  Knee extension 5 5  Ankle dorsiflexion 5 5  Ankle plantarflexion    Ankle inversion    Ankle eversion    (* = pain; Blank rows = not tested)   Sensation Grossly intact to light touch bilateral LEs as determined by testing dermatomes L2-S2. Proprioception, and hot/cold testing deferred on this date.   Reflexes Deferred   Muscle Length Hamstrings: R: Positive for limitation around 70 degrees L: Positive for limitation around 70 degrees Ely (quadriceps): R: Not examined L: Not examined Thomas (hip flexors): R: Not examined L: Not examined Ober: R: Not examined L: Not examined   Palpation  Location LEFT  RIGHT           Lumbar paraspinals 1 1  Quadratus Lumborum    Gluteus Maximus 0 0  Gluteus Medius 0 0  Deep hip external rotators 1 0  PSIS 0 0  Fortin's Area (SIJ) 0 0  Greater Trochanter 0 0  (Blank rows = not tested) Graded on 0-4 scale (0 = no pain, 1 = pain, 2 = pain with wincing/grimacing/flinching, 3 = pain with withdrawal, 4 = unwilling to allow palpation), (Blank rows = not tested)   Passive Accessory Intervertebral Motion Pt reports mild back pain with CPA L3-L5 and UPA bilaterally L3-L5. Generally, hypomobile throughout    SPECIAL TESTS Lumbar Radiculopathy and Discogenic: Centralization and Peripheralization (SN 92, -LR 0.12): Not examined Slump (SN 83, -LR 0.32): R: Negative L: Negative SLR (SN 92, -LR 0.29): R: Negative L:  Negative Crossed SLR (SP 90): R: Negative L: Negative  Facet Joint: Extension-Rotation (SN 100, -LR 0.0): R: Negative L: Negative  Lumbar Foraminal Stenosis: Lumbar quadrant (SN 70): R: Negative L: Negative  Hip: FABER (SN 81): R: Negative L: Negative FADIR (SN 94): R: Negative L: Negative Hip scour (SN 50): R: Negative L: Negative  SIJ:  Thigh Thrust (SN 88, -LR 0.18) : R: Not examined L: Not examined  Piriformis Syndrome: FAIR Test  (SN 88, SP 83): R: Not examined L: Not examined  Functional Tasks Deferred  Beighton scale: Deferred   TODAY'S TREATMENT  SUBJECTIVE: Pt reports that he is doing alright today. He denies any L shoulder pain but his R shoulder continues to bother him and he rates his pain as 5-6/10 upon arrival. He also reports 6/10 low back pain currently. He states that he had another episode of rectal bleeding which left him feeling very fatigued. No specific questions currently.    PAIN: Shoulders: L: none, R: 5-6/10, Back: 6/10;   Ther-ex  NuStep (seat 12, arms 11) L0-1 BUE/BLE x 32mnutes for warm-up during interval history All exercises performed on incline; Shoulder flexion 4# dumbbell (DB) in LUE, attempted with 1# DB in RUE but discontinued secondary to pain; Bicep curls with 5# DB in BUE 2 x 15 BUE; Chest press with cane and manual resistance from therapist 2 x 10; Rows with cane and manual resistance from therapist 2 x  10; Supine BUE shoulder flexion AROM with cane but discontinued secondary to pain; Clams with manual resistance from therapist 2 x 10 BLE; Adductor squeezes with manual resistance from therapist 2 x 15 BLE;  SLR 2 x 10 BLE; Heel slides with manually resisted extension 2 x 20 BLE; Partial bridges 2 x 10;  Manual Therapy  R shoulder long axis distraction to help with pain, grade I-II, 30s/bout x 3 bouts; R shoulder AP mobilizations at neutral to help with pain, grade I-II, 30s/bout x 2 bouts;   Not performed today: Double leg heel raises 2 x 10 Seated Nautilus rows 30# x 10, 40# 2 x 10; Seated Nautilus chest press 40# 3 x 10; Seated Pallof press with green tband 2 x 10 toward each side; Seated shoulder "Ws" with green tband resistance x 10; Seated shoulder IR with green tband resistance x 10, x 15 RLE, 2 x 15 LUE; Mini squats 2 x 10; Standing exercises with 3# ankle weights: Hip flexion marches 2 x 10 BLE; Hip abduction 2 x 10 BLE; Hamstring curls 2 x 10 BLE; Hip  extension 2 x 10 BLE; Shoulder scaption 4# dumbbell (DB) in LUE, 1# DB in RUE 2 x 15 BUE; Incline overhead shoulder press with 4# DB in LUE, 1# DB in RUE 2 x 15; Manually resisted tricep extension with cane x 10 BUE;   PATIENT EDUCATION:  Education details: Pt educated throughout session about proper posture and technique with exercises. Improved exercise technique, movement at target joints, use of target muscles after min to mod verbal, visual, tactile cues Person educated: Patient Education method: Explanation Education comprehension: verbalized understanding and returned demonstration;   HOME EXERCISE PROGRAM: Access Code: [email or web address] URL: [email or web address] Date: 06/08/2021 Prepared by: Roxana Hires  Exercises - Single Arm Shoulder Flexion with Dumbbell  - 1 x daily - 3 x weekly - 2-3 sets - 10 reps - 2s hold - Single Arm Scaption with Dumbbell  - 1 x daily - 3 x weekly - 2-3 sets - 10 reps - 2s hold - Seated Shoulder Row with Anchored Resistance  - 1 x daily - 3 x weekly - 2-3 sets - 10 reps - 2s hold - Shoulder W - External Rotation with Resistance  - 1 x daily - 3 x weekly - 2-3 sets - 10 reps - 2s hold - Supine Active Straight Leg Raise  - 1 x daily - 7 x weekly - 2-3 sets - 10 reps - 2s hold - Seated March with Resistance  - 1 x daily - 7 x weekly - 2-3 sets - 10 reps - 2s hold - Seated Hip Abduction with Resistance  - 1 x daily - 7 x weekly - 2-3 sets - 10 reps - 2s hold - Seated Hip Adduction Isometrics with Ball  - 1 x daily - 7 x weekly - 2-3 sets - 10 reps - 2s hold - Mini Squat with Counter Support  - 1 x daily - 7 x weekly - 2-3 sets - 10 reps - 2s hold   ASSESSMENT:  CLINICAL IMPRESSION:  Continued upper quarter strengthening during session today as well as hip and abdominal exercises. Right shoulder remains more painful and discontinued resisted R shoulder flexion secondary to increase in pain. He denies any significant pain in his L shoulder during  session. Pt demonstrates excellent effort and motivation throughout session and he reports appropriate muscle fatigue. He does appear somewhat frustrated during session today at the prospect of having to stay  on pain medication to help manage his chronic pain. Attempted light right GH joint mobilizations to help with pain control. Pt encouraged to follow-up with PT as scheduled. He will benefit from PT services to address deficits in strength and pain in order to return to full function at home and with leisure activities.    REHAB POTENTIAL: Good  CLINICAL DECISION MAKING: Stable/uncomplicated  EVALUATION COMPLEXITY: Low   GOALS: Goals reviewed with patient? No  SHORT TERM GOALS: Target date: 08/29/2021  Pt will be independent with HEP to improve strength and decrease shoulder and back pain to improve pain-free function at home and work. Baseline:  Goal status: ONGOING   LONG TERM GOALS: Target date: 07/22/2021  Pt will increase shoulder FOTO to at least 62 to demonstrate significant improvement in function at home and work related to shoulder pain  Baseline: 04/29/21: 50, 05/30/21: 57 Goal status: PARTIALLY MET  2.  Pt will report at least 75% improvement in shoulder symptoms with all activities and feel confident with gym-based exercise program in order to maintain pain-free shoulder function. Baseline: 05/30/21: Pt reports minimal shoulder pain since starting therapy until this weekend with new injury Goal status: ACHIEVED  3. Pt will decrease mODI scoreby at least 13 points in order demonstrate clinically significant reduction in pain/disability related to his back pain Baseline: 05/30/21: 72% Goal status: INITIAL  4. Pt will decrease worst back pain as reported on NPRS by at least 2 points in order to demonstrate clinically significant reduction in back pain.  Baseline: 05/30/21: worst: 10/10 Goal status: INITIAL    PLAN: PT FREQUENCY: 2x/week  PT DURATION: 12  weeks  PLANNED INTERVENTIONS: Therapeutic exercises, Therapeutic activity, Neuromuscular re-education, Balance training, Gait training, Patient/Family education, Joint mobilization, Vestibular training, Canalith repositioning, Aquatic Therapy, Dry Needling, Cognitive remediation, Electrical stimulation, Spinal manipulation, Spinal mobilization, Cryotherapy, Moist heat, Traction, Ultrasound, Ionotophoresis 41m/ml Dexamethasone, and Manual therapy  PLAN FOR NEXT SESSION: Continue strengthening for shoulders and back, review and modify HEP as needed,  JLyndel SafeHuprich PT, DPT, GCS  Oz Gammel 07/18/2021, 3:57 PM

## 2021-07-20 ENCOUNTER — Ambulatory Visit: Payer: Medicare Other

## 2021-07-22 ENCOUNTER — Other Ambulatory Visit: Payer: Self-pay

## 2021-07-22 DIAGNOSIS — Z0000 Encounter for general adult medical examination without abnormal findings: Secondary | ICD-10-CM

## 2021-07-22 DIAGNOSIS — R7309 Other abnormal glucose: Secondary | ICD-10-CM

## 2021-07-22 DIAGNOSIS — Z8546 Personal history of malignant neoplasm of prostate: Secondary | ICD-10-CM

## 2021-07-22 DIAGNOSIS — E782 Mixed hyperlipidemia: Secondary | ICD-10-CM

## 2021-07-22 NOTE — Therapy (Signed)
OUTPATIENT PHYSICAL THERAPY SHOULDER/BACK TREATMENT/RECERTIFICATION  Patient Name: [name] MRN: [id number] DOB:[birth year], 66 y.o., male Today's Date: 07/25/2021   PT End of Session - 07/25/21 1439     Visit Number 19    Number of Visits 49    Date for PT Re-Evaluation 10/17/21    Authorization Type eval: 04/29/21, recertification 07/25/21    PT Start Time 1400    PT Stop Time 1443    PT Time Calculation (min) 43 min    Activity Tolerance Patient tolerated treatment well    Behavior During Therapy [facility] for tasks assessed/performed              Past Medical History:  Diagnosis Date   Cardiomyopathy (in setting of Afib)    a. 12/2013 Echo: EF 45-50%, mild ant and antsept HK. mild MR. Mod dil LA. nl RV fxn. Rhythm was Afib.   DJD (degenerative joint disease) of knee    History of kidney stones    Intervertebral disc disorder with radiculopathy of lumbosacral region    Kidney stone 2012   Lower extremity edema    PAF (paroxysmal atrial fibrillation) (HCC)    Pernicious anemia    Prostate cancer (HCC)    Pulmonary nodule, right    a. 10/2014 Cardiac CTA: 7mm RLL nodule; b. 04/2015 CT Chest: stable 7mm RLL nodule. No new nodules; 02/2017 CTA Chest: stable, benign, 7mm RLL pulm nodule.   SVT (supraventricular tachycardia) [facility])    Past Surgical History:  Procedure Laterality Date   CARDIOVERSION N/A 05/15/2019   Procedure: CARDIOVERSION;  Surgeon: Antonieta Iba, MD;  Location: [facility] ORS;  Service: Cardiovascular;  Laterality: N/A;   CARPAL TUNNEL RELEASE Right 12/23/2014   Procedure: CARPAL TUNNEL RELEASE;  Surgeon: Deeann Saint, MD;  Location: [facility] ORS;  Service: Orthopedics;  Laterality: Right;   CARPAL TUNNEL RELEASE Left 01/06/2015   Procedure: CARPAL TUNNEL RELEASE;  Surgeon: Deeann Saint, MD;  Location: [facility] ORS;  Service: Orthopedics;  Laterality: Left;   COLONOSCOPY WITH PROPOFOL N/A 10/08/2017   Procedure: COLONOSCOPY WITH PROPOFOL;  Surgeon: Toney Reil,  MD;  Location: [facility] ENDOSCOPY;  Service: Gastroenterology;  Laterality: N/A;   GASTRIC BYPASS  2002   KNEE SURGERY     knee trauma   prostate seeding     Patient Active Problem List   Diagnosis Date Noted   Lesion of bone of lumbosacral spine (L5) 05/12/2021   Localized osteoarthritis of shoulder regions, bilateral 03/29/2021   Chronic pain of both shoulders 03/29/2021   Drug-induced myopathy 02/21/2021   Trigger finger of right hand 11/15/2020   Prostate cancer (HCC) 07/26/2020   Insomnia 08/01/2019   Primary osteoarthritis of both wrists 02/17/2019   Trigger middle finger of left hand 02/17/2019   Chronic pain syndrome 02/17/2019   Chronic radicular lumbar pain 10/08/2018   Lumbar radiculopathy 10/08/2018   Lumbar degenerative disc disease 10/08/2018   Lumbar facet arthropathy 10/08/2018   Lumbar facet joint syndrome 10/08/2018   Intervertebral disc disorder with radiculopathy of lumbosacral region    Pernicious anemia 05/22/2016   Paroxysmal atrial fibrillation (HCC) 12/20/2015   DJD (degenerative joint disease) of knee 10/27/2014   Bilateral carpal tunnel syndrome 10/27/2014   Morbid obesity with BMI of 50.0-59.9, adult (HCC) 10/27/2014   Mixed hyperlipidemia 10/27/2014   H/O gastric bypass 03/20/2014   Hyperkalemia 03/20/2014   History of prostate cancer 12/26/2013   Essential hypertension 12/26/2013   Encounter for anticoagulation discussion and counseling 12/26/2013    PCP: Althea Charon,  Netta Neat, DO  REFERRING PROVIDER: Saralyn Pilar *  REFERRING DIAGNOSIS: M19.011,M19.012 (ICD-10-CM) - Localized osteoarthritis of shoulder regions, bilateral, M65.30 (ICD-10-CM) - Trigger finger of right hand, unspecified finger, M25.511,G89.29,M25.512 (ICD-10-CM) - Chronic pain of both shoulders, M53.3 (ICD-10-CM) - Sacroiliac joint pain, G89.4 (ICD-10-CM) - Chronic pain syndrome  THERAPY DIAG: Other low back pain  Chronic left shoulder pain  Chronic right shoulder  pain  Muscle weakness (generalized)  ONSET DATE: 04/30/19  FOLLOW UP APPT WITH PROVIDER: Yes    From initial evaluation (04/29/21)  SUBJECTIVE:                                                                                                                                                                                         Chief Complaint: Bilateral shoulder pain  Pertinent History [name] is a 66 y.o. year old, male patient referred to physical therapy by pain management for bilateral shoulder pain. Pt reports chronic bilateral shoulder pain for approximately the last 2 years. The pain started in his R wrist which improved after an injection however the pain gradually started worsening and then moving up his R arm to his R shoulder. Eventually he started having pain in his L shoulder as well. He noticed the most significant pain when lifting his arms to wash under them while bathing. He had injections in both shoulders on March 27th with significant improvement in pain since that time. At this point pt states that he is not having any shoulder pain. He has a past medical history of Cardiomyopathy (in setting of Afib), DJD (degenerative joint disease) of knee, History of kidney stones, Intervertebral disc disorder with radiculopathy of lumbosacral region, Kidney stone (2012), Lower extremity edema, PAF (paroxysmal atrial fibrillation) (HCC), Pernicious anemia, Prostate cancer (HCC), Pulmonary nodule, right, and SVT (supraventricular tachycardia) (HCC). He also  has a past surgical history that includes Knee surgery; Gastric bypass (2002); prostate seeding; Carpal tunnel release (Right, 12/23/2014); Carpal tunnel release (Left, 01/06/2015); Colonoscopy with propofol (N/A, 10/08/2017); and Cardioversion (N/A, 05/15/2019). He has tapered off a lot of his medications over the last few months because he felt like a lot of his symptoms were coming from medication side effects. He has also noted that  he started having anxiety attacks since January which is new for him. Pt and his wife joined [employer] and he recently ordered an upright walker with forearm supports to help his mobility. Pt reports losing 41# in the last 2 years secondary to drop in his calorie intake.    Pain:  Pain Intensity: Present: 0/10, Best: 0/10, Worst: 0/10 (not currently hurting) Pain location: Bilateral  shoulders Pain Quality: sharp and aching  Radiating: No  Numbness/Tingling: Yes, bilateral hand numbness occasionally as well as bilateral foot numbness.  Focal Weakness: No, however pt has noticed weakness in both shoulders and hands. Aggravating factors: Lifting arms; Relieving factors: injections, pain medication, no benefit with heat/topical creams, unable to take NSAIDs secondary to taking Xarelto 24-hour pain behavior: Constant pain all the time (not currently); History of prior shoulder or neck injury, pain, surgery, or therapy: No Falls: Has patient fallen in last 6 months? No,  Dominant hand: right Imaging: Yes, R shoulder radiographs (03/29/21): Mild glenohumeral and mild acromioclavicular osteoarthritis; L shoulder radiographs (03/29/21):  Mild-to-moderate glenohumeral and mild acromioclavicular osteoarthritis. 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 Otherwise negative  Precautions: None  Weight Bearing Restrictions: No  Living Environment Lives with: lives with their spouse Lives in: House/apartment  Patient Goals: Improve shoulder strength and prevent pain from returning. Pt would also like to address  his back pain/sciatica;   OBJECTIVE:   Patient Surveys  FOTO: 50, predicted improvement to 68  Cognition Patient is oriented to person, place, and time.  Recent memory is intact.  Remote memory is intact.  Attention span and concentration are intact.  Expressive speech is intact.  Patient's fund of knowledge is within normal limits for educational level.   Gross Musculoskeletal Assessment Tremor: None Bulk: Normal Tone: Normal  Gait Deferred  Posture Forward head and rounded shoulders in both sitting and standing;  Cervical Screen AROM: WFL and painless with overpressure in all planes Spurlings A (ipsilateral lateral flexion/axial compression): R: Negative L: Negative Spurlings B (ipsilateral lateral flexion/contralateral rotation/axial compression): R: Negative L: Negative Repeated movement: Not tested Hoffman Sign (cervical cord compression): R: Positive L: Negative ULTT Median: R: Not examined L: Not examined ULTT Ulnar: R: Not examined L: Not examined ULTT Radial: R: Not examined L: Not examined   AROM  AROM (Normal range in degrees) AROM 07/25/2021  Cervical  Flexion (50) WNL  Extension (80) WNL  Right lateral flexion (45) WNL  Left lateral flexion (45) WNL  Right rotation (85) WNL  Left rotation (85) WNL     Right Left  Shoulder    Flexion 177 170  Extension    Abduction 155 146  External Rotation 90 90  Internal Rotation 70 70    Elbow    Flexion WNL WNL  Extension WNL WNL  Pronation WNL WNL  Supination WNL WNL  (* = pain; Blank rows = not tested)   LE MMT:  MMT (out of 5) Right 07/25/2021 Left 07/25/2021  Cervical (isometric)  Flexion   Extension   Lateral Flexion    Rotation        Shoulder   Flexion 4+ 5  Extension    Abduction 5 5*  External rotation (seated) 5 5  Internal rotation (seated) 5 5  Horizontal abduction    Horizontal adduction    Lower Trapezius    Rhomboids        Elbow  Flexion 5 5  Extension 5 5   Pronation 5 5  Supination 5 5      Wrist  Flexion 5 5  Extension 5 5  Radial deviation 5 5  Ulnar deviation  5 5      MCP  Flexion    Extension    Abduction 5 5  Adduction 5 5  (* = pain; Blank rows = not tested)  Sensation Deferred  Reflexes Deferred   Palpation No pain with palpation to anterior, lateral, and posterior shoulders bilaterally;   Passive Accessory Intervertebral Motion Deferred    Accessory Motions/Glides Glenohumeral: Posterior: R: abnormal (hypomobile) L: abnormal (hypomobile) Inferior: R: not examined L: not examined Anterior: R: not examined L: not examined  Acromioclavicular:  Posterior: R: not examined L: not examined Anterior: R: not examined L: not examined  Sternoclavicular: Posterior: R: not examined L: not examined Anterior: R: not examined L: not examined Superior: R: not examined L: not examined Inferior: R: not examined L: not examined  Scapulothoracic: Distraction: R: not examined L: not examined Medial: R: not examined L: not examined Lateral: R: not examined L: not examined Inferior: R: not examined L: not examined Superior: R: not examined L: not examined  Muscle Length Testing Deferred    SPECIAL TESTS  Rotator Cuff  Drop Arm Test: Negative Painful Arc (Pain from 60 to 120 degrees scaption): Negative Infraspinatus Muscle Test: Negative  Subacromial Impingement Hawkins-Kennedy: R: Negative L: Negative Neer (Block scapula, PROM flexion): R: Negative L: Negative Painful Arc (Pain from 60 to 120 degrees scaption): R: Negative L: Negative Empty Can: R: Not tested L: Not tested External Rotation Resistance: R: Negative L: Negative Horizontal Adduction: Not examined Scapular Assist: R: Not tested L: Not tested  Labral Tear Biceps Load II (120 elevation, full ER, 90 elbow flexion, full supination, resisted elbow flexion): R: Negative L: Negative Crank (160 scaption, axial load with IR/ER): R: Positive L:  Negative Active Compression Test: R: Not tested L: Not tested  Bicep Tendon Pathology Speed (shoulder flexion to 90, external rotation, full elbow extension, and forearm supination with resistance: Not examined Yergason's (resisted shoulder ER and supination/biceps tendon pathology): R: Positive L: Positive  Shoulder Instability Sulcus Sign: R: Not tested L: Not tested Anterior Apprehension: R: Negative L: Negative  Beighton scale: Deferred   Back Assessment 05/04/21  Pertinent Back Pain History [name] is a 66 y.o. year old, male patient referred to physical therapy by pain management for bilateral shoulder pain. He also reports a history of chronic low back pain. He has had intermittent occasional acute flares of back pain for decades. However pain worsened around 2017 when he noticed LLE sciatica and started having LLE weakness/buckling. He saw a chiropractor in [place] who thought it was better to send him for [facility] which significantly helped the pain. He traveled to [place] and while he was there his pain worsened. When he returned he had another ESI which improved his pain however it eventually returned.  For the last two years that he was working he could hardly walk due to the pain. He saw Dr. Hyacinth Meeker (ortho) who referred him to Dr. Cherylann Ratel with pain management. Pt has had lumbar injections and the pain improves with injections however it return after about a month. He had a radiofrequency ablation for his L lumbar spine in early February and R side of his lumbar spine in late February. Once he had the procedure on the R side he had complete resolution of his pain however it has gradually returned on the L side. Pain radiates into his L buttock down to the L knee. He is currently on a course prednisone.   Pain:  Pain Intensity: Present: 2/10, Best:  0/10, Worst: 10/10 Pain location: Central low back spreading out to the left side; Pain Quality: aching, deep Radiating: Yes, into the  L buttock and down the L posterior thigh to the knee  Numbness/Tingling: Yes, numbness/tingling into L calf and in L foot Focal Weakness: Yes, occasional feeling of bilateral LE weakness but not as bad as it had been previously Aggravating factors: walking, sometimes starts spontaneously, extended sitting in the evening,  Relieving factors: pain medications (takes infrequently), sometimes laying down helps, heat (pt unsure if its the heat or laying down that actually helps) 24-hour pain behavior: Better in the AM, worse as the day progresses; Imaging: Lumbar radiograph 12/08/20: FINDINGS: 13 mm anterolisthesis L5 on S1. 5 mm retrolisthesis L4 on L5. 7 mm retrolisthesis L2 on L3. Vertebral body heights are maintained. There is mild scoliosis of the lumbar spine. Multilevel degenerative change with marked disc space narrowing and osteophyte at L4-L5, mild degenerative changes at T12-L1, L1-L2 and L2-L3. Facet degenerative changes at multiple levels. Aortic atherosclerosis. With flexion, grossly stable 13 mm anterolisthesis L5 on S1 and retrolisthesis L4 on L5. Retrolisthesis at L2-L3 slightly diminishes to 4 mm. With extension, similar 13 mm anterolisthesis L5 on S1 and similar retrolisthesis L4 on L5. 8 mm retrolisthesis L2 on L3.  Lumbar MRI 09/12/18: (see entire report however significant findings below): Baseline narrowing of the vertebral canal due to congenitally short pedicles, degenerative changes thorughout the lumbar spine including moderate to severe left neuroforaminal narrowing at L4-5, bilateral mildly diastatic pars defects trace anterolisthesis L5 on S1, mild R facet edema at L4-5 and mild periarticular soft tissue edema at L4-5 bilaterally, mild R facet edema at L5-S1, sclerotic lesion in the L2 vertebral body and the left aspect of the S2 body. Given the patient's history of prostate cancer, sequela of prostate metastasis cannot be excluded.  History of prior back injury, pain, surgery, or  therapy: Yes, Roseanne Reno PT years ago for back pain, has had chiropractic care as well  Red flags: History of prostate cancer, progressive weight loss over the last couple years. Denies bowel/bladder changes, saddle paresthesia, h/o spinal tumors, h/o compression fx, h/o abdominal aneurysm, abdominal pain, chills/fever, night sweats, nausea, vomiting;  Precautions: None  Weight Bearing Restrictions: No   Patient Goals: Decrease back pain and improve his ability to be active and exercise;   OBJECTIVE:   Cognition Patient is oriented to person, place, and time.  Recent memory is intact.  Remote memory is intact.  Attention span and concentration are intact.  Expressive speech is intact.  Patient's fund of knowledge is within normal limits for educational level.     Gross Musculoskeletal Assessment Tremor: None Bulk: Normal Tone: Normal No visible step-off along spinal column;  GAIT: Deferred  Posture: Lumbar lordosis: WNL Iliac crest height: Equal bilaterally Lumbar lateral shift: Negative History of scoliosis reported on imaging but not evident during physical exam today  AROM  AROM (Normal range in degrees) AROM  07/25/2021  Lumbar   Flexion (65) WNL  Extension (30) Mild deficit  Right lateral flexion (25) Mild deficit  Left lateral flexion (25) Mild deficit  Right rotation (30) Mild deficit  Left rotation (30) Mild deficit      Hip Right Left  Flexion (125) WNL WNL  Extension (15) Moderate limitation Moderate limitation  Abduction (40)    Adduction     Internal Rotation (45) >30 degrees >30 degrees  External Rotation (45) >30 degrees >30 degrees       Knee  Flexion (135) WNL WNL  Extension (0) WNL WNL      Ankle    Dorsiflexion (20)    Plantarflexion (50)    Inversion (35)    Eversion (15    (* = pain; Blank rows = not tested)   LE MMT:  MMT (out of 5) Right 07/25/2021 Left 07/25/2021  Hip flexion 5 5  Hip extension    Hip abduction (seated) 5  5  Hip adduction (seated) 5 5  Hip internal rotation 5 5  Hip external rotation 5 5  Knee flexion 5 5  Knee extension 5 5  Ankle dorsiflexion 5 5  Ankle plantarflexion    Ankle inversion    Ankle eversion    (* = pain; Blank rows = not tested)   Sensation Grossly intact to light touch bilateral LEs as determined by testing dermatomes L2-S2. Proprioception, and hot/cold testing deferred on this date.   Reflexes Deferred   Muscle Length Hamstrings: R: Positive for limitation around 70 degrees L: Positive for limitation around 70 degrees Ely (quadriceps): R: Not examined L: Not examined Thomas (hip flexors): R: Not examined L: Not examined Ober: R: Not examined L: Not examined   Palpation  Location LEFT  RIGHT           Lumbar paraspinals 1 1  Quadratus Lumborum    Gluteus Maximus 0 0  Gluteus Medius 0 0  Deep hip external rotators 1 0  PSIS 0 0  Fortin's Area (SIJ) 0 0  Greater Trochanter 0 0  (Blank rows = not tested) Graded on 0-4 scale (0 = no pain, 1 = pain, 2 = pain with wincing/grimacing/flinching, 3 = pain with withdrawal, 4 = unwilling to allow palpation), (Blank rows = not tested)   Passive Accessory Intervertebral Motion Pt reports mild back pain with CPA L3-L5 and UPA bilaterally L3-L5. Generally, hypomobile throughout    SPECIAL TESTS Lumbar Radiculopathy and Discogenic: Centralization and Peripheralization (SN 92, -LR 0.12): Not examined Slump (SN 83, -LR 0.32): R: Negative L: Negative SLR (SN 92, -LR 0.29): R: Negative L:  Negative Crossed SLR (SP 90): R: Negative L: Negative  Facet Joint: Extension-Rotation (SN 100, -LR 0.0): R: Negative L: Negative  Lumbar Foraminal Stenosis: Lumbar quadrant (SN 70): R: Negative L: Negative  Hip: FABER (SN 81): R: Negative L: Negative FADIR (SN 94): R: Negative L: Negative Hip scour (SN 50): R: Negative L: Negative  SIJ:  Thigh Thrust (SN 88, -LR 0.18) : R: Not examined L: Not examined  Piriformis  Syndrome: FAIR Test (SN 88, SP 83): R: Not examined L: Not examined  Functional Tasks Deferred  Beighton scale: Deferred   TODAY'S TREATMENT  SUBJECTIVE: Pt reports that he is doing alright today. His low back is feeling alright today and has been slightly better however his shoulders have been very painful. He would like to defer any exercises for his shoulders today due to the pain. He has a follow-up appointment with Dr. Cherylann Ratel and would like to pause therapy until after they develop a plan of care and he gets back from vacation.   PAIN: Significant bilateral shoulder pain, minimal low back pain at rest;   Ther-ex  NuStep (seat 12) L0-2 BLE only x 6 minutes for warm-up during interval history  All exercises performed on incline; Clams with manual resistance from therapist 2 x 15 BLE; Adductor squeezes with manual resistance from therapist 2 x 15 BLE;  SLR with 1# ankle weights 2 x 15 BLE; Heel  slides with manually resisted extension 2 x 15 BLE; Partial bridges 2 x 12; Lumbar rotation rocking x 60s; Ant/post pelvic tilts x 10 each;   Manual Therapy  Hip long axis distraction to help with pain, grade I-II, 30s/bout x 2 bouts on each;; Hamstring stretch x 30s BLE; Hip FABER and FADIR stretch x 30s BLE;   Not performed today: Double leg heel raises 2 x 10 Seated Nautilus rows 30# x 10, 40# 2 x 10; Seated Nautilus chest press 40# 3 x 10; Seated Pallof press with green tband 2 x 10 toward each side; Seated shoulder "Ws" with green tband resistance x 10; Seated shoulder IR with green tband resistance x 10, x 15 RLE, 2 x 15 LUE; Mini squats 2 x 10; Standing exercises with 3# ankle weights: Hip flexion marches 2 x 10 BLE; Hip abduction 2 x 10 BLE; Hamstring curls 2 x 10 BLE; Hip extension 2 x 10 BLE; Shoulder scaption 4# dumbbell (DB) in LUE, 1# DB in RUE 2 x 15 BUE; Incline overhead shoulder press with 4# DB in LUE, 1# DB in RUE 2 x 15; Manually resisted tricep extension  with cane x 10 BUE; Shoulder flexion 4# dumbbell (DB) in LUE, attempted with 1# DB in RUE but discontinued secondary to pain; Bicep curls with 5# DB in BUE 2 x 15 BUE; Chest press with cane and manual resistance from therapist 2 x 10; Rows with cane and manual resistance from therapist 2 x 10; Supine BUE shoulder flexion AROM with cane but discontinued secondary to pain;   PATIENT EDUCATION:  Education details: Pt educated throughout session about proper posture and technique with exercises. Improved exercise technique, movement at target joints, use of target muscles after min to mod verbal, visual, tactile cues Person educated: Patient Education method: Explanation Education comprehension: verbalized understanding and returned demonstration;   HOME EXERCISE PROGRAM: Access Code: [email or web address] URL: [email or web address] Date: 06/08/2021 Prepared by: Ria Comment  Exercises - Single Arm Shoulder Flexion with Dumbbell  - 1 x daily - 3 x weekly - 2-3 sets - 10 reps - 2s hold - Single Arm Scaption with Dumbbell  - 1 x daily - 3 x weekly - 2-3 sets - 10 reps - 2s hold - Seated Shoulder Row with Anchored Resistance  - 1 x daily - 3 x weekly - 2-3 sets - 10 reps - 2s hold - Shoulder W - External Rotation with Resistance  - 1 x daily - 3 x weekly - 2-3 sets - 10 reps - 2s hold - Supine Active Straight Leg Raise  - 1 x daily - 7 x weekly - 2-3 sets - 10 reps - 2s hold - Seated March with Resistance  - 1 x daily - 7 x weekly - 2-3 sets - 10 reps - 2s hold - Seated Hip Abduction with Resistance  - 1 x daily - 7 x weekly - 2-3 sets - 10 reps - 2s hold - Seated Hip Adduction Isometrics with Ball  - 1 x daily - 7 x weekly - 2-3 sets - 10 reps - 2s hold - Mini Squat with Counter Support  - 1 x daily - 7 x weekly - 2-3 sets - 10 reps - 2s hold   ASSESSMENT:  CLINICAL IMPRESSION:   Last updated outcome measures and goals with patient during 5/15 visit. His shoulder FOTO score improved  from 50 at initial evaluation to 57. At that time mODI revealed 72% self-reported disability related to his low back  pain. He is reporting an increase in his bilateral shoulder pain recently and has an upcoming appointment to see Dr. Cherylann Ratel. He would like to pause therapy until after he establishes a plan of care with Dr. Cherylann Ratel and returns from vacation. Pt requests deferring shoulder strengthening today due to the pain. Continued hip and abdominal exercises. Pt reports appropriate muscle fatigue. Added 1 pound ankle weights to straight leg raises. Pt encouraged to follow-up with PT as scheduled. He will benefit from PT services to address deficits in strength and pain in order to return to full function at home and with leisure activities.     REHAB POTENTIAL: Good  CLINICAL DECISION MAKING: Stable/uncomplicated  EVALUATION COMPLEXITY: Low   GOALS: Goals reviewed with patient? No  SHORT TERM GOALS: Target date: 09/05/2021  Pt will be independent with HEP to improve strength and decrease shoulder and back pain to improve pain-free function at home and work. Baseline:  Goal status: ONGOING   LONG TERM GOALS: Target date: 07/22/2021  Pt will increase shoulder FOTO to at least 62 to demonstrate significant improvement in function at home and work related to shoulder pain  Baseline: 04/29/21: 50, 05/30/21: 57 Goal status: PARTIALLY MET  2.  Pt will report at least 75% improvement in shoulder symptoms with all activities and feel confident with gym-based exercise program in order to maintain pain-free shoulder function. Baseline: 05/30/21: Pt reports minimal shoulder pain since starting therapy until this weekend with new injury Goal status: ACHIEVED  3. Pt will decrease mODI scoreby at least 13 points in order demonstrate clinically significant reduction in pain/disability related to his back pain Baseline: 05/30/21: 72% Goal status: INITIAL  4. Pt will decrease worst back pain as reported  on NPRS by at least 2 points in order to demonstrate clinically significant reduction in back pain.  Baseline: 05/30/21: worst: 10/10 Goal status: INITIAL    PLAN: PT FREQUENCY: 2x/week  PT DURATION: 12 weeks  PLANNED INTERVENTIONS: Therapeutic exercises, Therapeutic activity, Neuromuscular re-education, Balance training, Gait training, Patient/Family education, Joint mobilization, Vestibular training, Canalith repositioning, Aquatic Therapy, Dry Needling, Cognitive remediation, Electrical stimulation, Spinal manipulation, Spinal mobilization, Cryotherapy, Moist heat, Traction, Ultrasound, Ionotophoresis 4mg /ml Dexamethasone, and Manual therapy  PLAN FOR NEXT SESSION: Continue strengthening for shoulders and back, review and modify HEP as needed,  Sharalyn Ink Alonza Knisley PT, DPT, GCS  Ethon Wymer 07/25/2021, 2:54 PM

## 2021-07-25 ENCOUNTER — Ambulatory Visit: Payer: Medicare Other

## 2021-07-25 DIAGNOSIS — M25512 Pain in left shoulder: Secondary | ICD-10-CM | POA: Diagnosis not present

## 2021-07-25 DIAGNOSIS — G8929 Other chronic pain: Secondary | ICD-10-CM

## 2021-07-25 DIAGNOSIS — M5459 Other low back pain: Secondary | ICD-10-CM | POA: Diagnosis not present

## 2021-07-25 DIAGNOSIS — M6281 Muscle weakness (generalized): Secondary | ICD-10-CM | POA: Diagnosis not present

## 2021-07-25 DIAGNOSIS — M25511 Pain in right shoulder: Secondary | ICD-10-CM | POA: Diagnosis not present

## 2021-07-27 ENCOUNTER — Other Ambulatory Visit: Payer: Medicare Other

## 2021-07-27 DIAGNOSIS — Z0000 Encounter for general adult medical examination without abnormal findings: Secondary | ICD-10-CM

## 2021-07-27 DIAGNOSIS — E782 Mixed hyperlipidemia: Secondary | ICD-10-CM

## 2021-07-27 DIAGNOSIS — R7309 Other abnormal glucose: Secondary | ICD-10-CM | POA: Diagnosis not present

## 2021-07-27 DIAGNOSIS — I10 Essential (primary) hypertension: Secondary | ICD-10-CM

## 2021-07-27 DIAGNOSIS — Z8546 Personal history of malignant neoplasm of prostate: Secondary | ICD-10-CM

## 2021-07-28 ENCOUNTER — Ambulatory Visit
Payer: Medicare Other | Attending: Student in an Organized Health Care Education/Training Program | Admitting: Student in an Organized Health Care Education/Training Program

## 2021-07-28 DIAGNOSIS — M6530 Trigger finger, unspecified finger: Secondary | ICD-10-CM | POA: Diagnosis not present

## 2021-07-28 DIAGNOSIS — G894 Chronic pain syndrome: Secondary | ICD-10-CM

## 2021-07-28 DIAGNOSIS — M25511 Pain in right shoulder: Secondary | ICD-10-CM

## 2021-07-28 DIAGNOSIS — M25512 Pain in left shoulder: Secondary | ICD-10-CM | POA: Diagnosis not present

## 2021-07-28 DIAGNOSIS — G8929 Other chronic pain: Secondary | ICD-10-CM

## 2021-07-28 DIAGNOSIS — M19012 Primary osteoarthritis, left shoulder: Secondary | ICD-10-CM | POA: Diagnosis not present

## 2021-07-28 DIAGNOSIS — M19011 Primary osteoarthritis, right shoulder: Secondary | ICD-10-CM

## 2021-07-28 LAB — CBC WITH DIFFERENTIAL/PLATELET
Absolute Monocytes: 713 cells/uL (ref 200–950)
Basophils Absolute: 43 cells/uL (ref 0–200)
Basophils Relative: 0.6 %
Eosinophils Absolute: 58 cells/uL (ref 15–500)
Eosinophils Relative: 0.8 %
HCT: 40.5 % (ref 38.5–50.0)
Hemoglobin: 12.6 g/dL — ABNORMAL LOW (ref 13.2–17.1)
Lymphs Abs: 2225 cells/uL (ref 850–3900)
MCH: 24.8 pg — ABNORMAL LOW (ref 27.0–33.0)
MCHC: 31.1 g/dL — ABNORMAL LOW (ref 32.0–36.0)
MCV: 79.7 fL — ABNORMAL LOW (ref 80.0–100.0)
MPV: 9.4 fL (ref 7.5–12.5)
Monocytes Relative: 9.9 %
Neutro Abs: 4162 cells/uL (ref 1500–7800)
Neutrophils Relative %: 57.8 %
Platelets: 244 10*3/uL (ref 140–400)
RBC: 5.08 10*6/uL (ref 4.20–5.80)
RDW: 15.3 % — ABNORMAL HIGH (ref 11.0–15.0)
Total Lymphocyte: 30.9 %
WBC: 7.2 10*3/uL (ref 3.8–10.8)

## 2021-07-28 LAB — COMPREHENSIVE METABOLIC PANEL
AG Ratio: 2 (calc) (ref 1.0–2.5)
ALT: 10 U/L (ref 9–46)
AST: 12 U/L (ref 10–35)
Albumin: 4.2 g/dL (ref 3.6–5.1)
Alkaline phosphatase (APISO): 55 U/L (ref 35–144)
BUN: 15 mg/dL (ref 7–25)
CO2: 27 mmol/L (ref 20–32)
Calcium: 9.3 mg/dL (ref 8.6–10.3)
Chloride: 107 mmol/L (ref 98–110)
Creat: 1.13 mg/dL (ref 0.70–1.35)
Globulin: 2.1 g/dL (calc) (ref 1.9–3.7)
Glucose, Bld: 98 mg/dL (ref 65–99)
Potassium: 5 mmol/L (ref 3.5–5.3)
Sodium: 142 mmol/L (ref 135–146)
Total Bilirubin: 0.4 mg/dL (ref 0.2–1.2)
Total Protein: 6.3 g/dL (ref 6.1–8.1)

## 2021-07-28 LAB — LIPID PANEL
Cholesterol: 225 mg/dL — ABNORMAL HIGH (ref <200)
HDL: 66 mg/dL (ref >=40)
LDL Cholesterol (Calc): 140 mg/dL (calc) — ABNORMAL HIGH
Non-HDL Cholesterol (Calc): 159 mg/dL (calc) — ABNORMAL HIGH (ref <130)
Total CHOL/HDL Ratio: 3.4 (calc) (ref <5.0)
Triglycerides: 93 mg/dL (ref <150)

## 2021-07-28 LAB — HEMOGLOBIN A1C
Hgb A1c MFr Bld: 5.6 % of total Hgb (ref <5.7)
Mean Plasma Glucose: 114 mg/dL
eAG (mmol/L): 6.3 mmol/L

## 2021-07-28 LAB — TSH: TSH: 1.43 mIU/L (ref 0.40–4.50)

## 2021-07-28 LAB — PSA: PSA: <0.04 ng/mL (ref <=4.00)

## 2021-07-28 NOTE — Progress Notes (Signed)
Patient: [name]  Service Category: E/M  Provider: Gillis Santa, MD  DOB: 03/24/1955  DOS: 07/28/2021  Location: Office  MRN: [id number]  Setting: Ambulatory outpatient  Referring Provider: Nobie Putnam *  Type: Established Patient  Specialty: Interventional Pain Management  PCP: Olin Hauser, DO  Location: Remote location  Delivery: TeleHealth     Virtual Encounter - Pain Management PROVIDER NOTE: Information contained herein reflects review and annotations entered in association with encounter. Interpretation of such information and data should be left to medically-trained personnel. Information provided to patient can be located elsewhere in the medical record under "Patient Instructions". Document created using STT-dictation technology, any transcriptional errors that may result from process are unintentional.    Contact & Pharmacy Preferred: [phone] Home: [phone] (home) Mobile: [phone] (mobile) E-mail: [email or web address]  [facility], [address] AT [address] [facility] [address] [place] [address] Phone: [phone] Fax: [phone]  [id number] [place], [id number]: [phone]: [phone]  Pre-screening  [name] offered "in-person" vs "virtual" encounter. He indicated preferring virtual for this encounter.   Reason COVID-19*  Social distancing based on CDC and AMA recommendations.   I contacted Wayne Hansenon 07/28/2021 via telephone.      I clearly identified myself as BGillis Santa MD. I verified that I was speaking with the correct person using two identifiers (Name: [name] and date of birth: 708-17-1957.  Consent I sought verbal advanced consent from Wayne Hansenfor virtual visit interactions. I informed Mr. FTadesseof possible security and privacy concerns, risks, and limitations associated with providing  "not-in-person" medical evaluation and management services. I also informed Mr. FTalcottof the availability of "in-person" appointments. Finally, I informed him that there would be a charge for the virtual visit and that he could be  personally, fully or partially, financially responsible for it. [name] understanding and agreed to proceed.   Historic Elements   Mr. RIZICK GASBARROis a 66y.o. year old, male patient evaluated today after our last contact on 06/29/2021. [name] has a past medical history of Cardiomyopathy (in setting of Afib), DJD (degenerative joint disease) of knee, History of kidney stones, Intervertebral disc disorder with radiculopathy of lumbosacral region, Kidney stone (2012), Lower extremity edema, PAF (paroxysmal atrial fibrillation) ([place], Pernicious anemia, [place] cancer [place], Pulmonary nodule, right, and SVT (supra[place]ricular tachycardia) ([place]. He also  has a past surgical history that includes Knee surgery; Gastric bypass (2002); prostate seeding; Carpal tunnel release (Right, 12/23/2014); Carpal tunnel release (Left, 01/06/2015); Colonoscopy with propofol (N/A, 10/08/2017); and Cardioversion (N/A, 05/15/2019). [name] a current medication list which includes the following prescription(s): amiodarone, amlodipine, aspirin, benazepril, buspirone, carboxymethylcellulose, cyanocobalamin, cyclobenzaprine, duloxetine, hydrocodone-acetaminophen, hydrocodone-acetaminophen, melatonin, metoprolol succinate, multivitamin with minerals, xarelto, trazodone, acetaminophen, diazepam, fluticasone, furosemide, and ibuprofen. He  reports that he quit smoking about 42 years ago. His smoking use included cigarettes. He has a 10.00 pack-year smoking history. He has quit using smokeless tobacco.  His smokeless tobacco use included snuff. He reports current alcohol use of about 12.0 standard drinks of alcohol per week. He reports that he does not use drugs. Mr. FKockhas No Known  Allergies.   HPI  Today, he is being contacted for a post-procedure assessment.   Post-procedure evaluation    Procedure:  Anesthesia, Analgesia, Anxiolysis:  Type: Therapeutic Glenohumeral Joint (shoulder) Injection #2  Primary Purpose: Diagnostic Region: Superior Shoulder Area Level:  Shoulder Target Area: Glenohumeral Joint (shoulder) Approach: Anterior approach. Laterality: Bilateral  Anesthesia: Local (1-2% Lidocaine)  Anxiolysis: None  Sedation: None  Guidance: Fluoroscopy           Position: Supine   1. Localized osteoarthritis of shoulder regions, bilateral   2. Trigger finger of right hand, unspecified finger   3. Chronic pain of both shoulders   4. Chronic pain syndrome    NAS-11 Pain score:   Pre-procedure: 4 /10   Post-procedure: 2 /10      Effectiveness:  Initial hour after procedure: 100 %  Subsequent 4-6 hours post-procedure: 100 % (procedure did help the pain in shoulders, some days he has pain but the severe pain in the shoulder is much better.)  Analgesia past initial 6 hours: 100 % (muscle from shoulder down to elbow bilaterally.  patient feels like the pain is in the muscle tissue.  feels as if he is having some atrophy.  is thinking maybe he should have an MRI.)  Ongoing improvement:  Analgesic:  100% pain for shoulder pain, having increased MSK pain in bilateral biceps Function: Somewhat improved ROM: Somewhat improved   Pharmacotherapy Assessment   Opioid Analgesic: Tramadol 50 mg 4 times daily as needed, quantity 120/month; MME equals 20    Monitoring: [place] PMP: PDMP not reviewed this encounter.       Pharmacotherapy: No side-effects or adverse reactions reported. Compliance: No problems identified. Effectiveness: Clinically acceptable. Plan: Refer to "POC". UDS:  Summary  Date Value Ref Range Status  04/06/2020 Note  Final    Comment:    ==================================================================== [employer] Select 13  (MW) ==================================================================== Test                             Result       Flag       Units  Drug Present and Declared for Prescription Verification   Tramadol                       >4386        EXPECTED   ng/mg creat   O-Desmethyltramadol            3353         EXPECTED   ng/mg creat   N-Desmethyltramadol            >4386        EXPECTED   ng/mg creat    Source of tramadol is a prescription medication. O-desmethyltramadol    and N-desmethyltramadol are expected metabolites of tramadol.  ==================================================================== Test                      Result    Flag   Units      Ref Range   Creatinine              114              mg/dL      >=20 ==================================================================== Declared Medications:  The flagging and interpretation on this report are based on the  following declared medications.  Unexpected results may arise from  inaccuracies in the declared medications.   **Note: The testing scope of this panel includes these medications:   Tramadol   **Note: The testing scope of this panel does not  include the  following reported medications:   Acetaminophen (Tylenol)  Amiodarone  Amlodipine  Benazepril  Cyclobenzaprine (Flexeril)  Ezetimibe  Fluticasone  Furosemide  Gabapentin  Menthol  Methyl Salicylate (topical)  Metoprolol  Mirtazapine  Multivitamin  Rivaroxaban  Rosuvastatin  Trazodone  Vitamin B12 ==================================================================== For clinical consultation, please call [phone]. ====================================================================      Laboratory Chemistry Profile   Renal Lab Results  Component Value Date   BUN 15 07/27/2021   CREATININE 1.13 07/37/1062   BCR NOT APPLICABLE 69/48/5462   GFRAA 72 07/21/2020   GFRNONAA 62 07/21/2020    Hepatic Lab Results  Component Value Date    AST 12 07/27/2021   ALT 10 07/27/2021   ALBUMIN 4.6 08/01/2019   ALKPHOS 72 08/01/2019   LIPASE 28 03/13/2017    Electrolytes Lab Results  Component Value Date   NA 142 07/27/2021   K 5.0 07/27/2021   CL 107 07/27/2021   CALCIUM 9.3 07/27/2021   MG 2.0 02/01/2021    Bone Lab Results  Component Value Date   25OHVITD1 32 02/01/2021   25OHVITD2 <1.0 02/01/2021   25OHVITD3 32 02/01/2021    Inflammation (CRP: Acute Phase) (ESR: Chronic Phase) No results found for: "CRP", "ESRSEDRATE", "LATICACIDVEN"       Note: Above Lab results reviewed.  Imaging  DG PAIN CLINIC C-ARM 1-60 MIN NO REPORT Fluoro was used, but no Radiologist interpretation will be provided.  Please refer to "NOTES" tab for provider progress note.  Assessment  The primary encounter diagnosis was Localized osteoarthritis of shoulder regions, bilateral. Diagnoses of Trigger finger of right hand, unspecified finger, Chronic pain of both shoulders, and Chronic pain syndrome were also pertinent to this visit.  Plan of Care   Referral to Dr Posey Pronto to evaluate bilateral biceps pain that should be radiating pain from shoulder OA/rotator cuff dysfunction. Overall his lower back and bilateral knees are better.  He is utilizing his tramadol as prescribed.  He states that he will follow-up for repeat Hyalgan injections He is aware that we can repeat lumbar RFA increased lower back pain.   Orders:  Orders Placed This Encounter  Procedures   AMB referral to orthopedics    Referral Priority:   Routine    Referral Type:   Consultation    Referred to Provider:   Leim Fabry, MD    Requested Specialty:   Orthopedic Surgery    Number of Visits Requested:   1   Follow-up plan:   Return for Keep sch. appt.     s/p lesi #1 (left L4/5) on 8/17, #2 on 10/21/2018 (left L3/4), #3 on 12/09/2018 (Left L3/4); 02/24/2019: Right wrist injection and left trigger finger (middle finger) injection.,  Bilateral wrist injection 10/15/2019,  bilateral knee Hyalgan No. 1 01/26/1960. #2 02/23/2020, #3 03/24/2020.  Endorsing approximately 75 to 80% pain relief for bilateral knee pain.  Lumbar L3-5 RFA, Left: 02/16/21, Right 03/09/21: 90% pain relief           Recent Visits Date Type Provider Dept  06/27/21 Procedure visit Gillis Santa, MD [facility]  06/16/21 Office Visit Gillis Santa, MD [facility]  05/23/21 Office Visit Gillis Santa, MD [facility]  05/12/21 Office Visit Gillis Santa, MD [facility]  Showing recent visits within past 90 days and meeting all other requirements Today's Visits Date Type Provider Dept  07/28/21 Office Visit Gillis Santa, MD [facility]  Showing today's visits and meeting all other requirements Future Appointments Date  Type Provider Dept  08/23/21 Appointment Gillis Santa, MD [facility]  Showing future appointments within next 90 days and meeting all other requirements  I discussed the assessment and treatment plan with the patient. The patient was provided an opportunity to ask questions and all were answered. The patient agreed with the plan and demonstrated an understanding of the instructions.  Patient advised to call back or seek an in-person evaluation if the symptoms or condition worsens.  Duration of encounter: 59mnutes.  Note by: BGillis Santa MD Date: 07/28/2021; Time: 1:36 PM

## 2021-08-01 DIAGNOSIS — H43813 Vitreous degeneration, bilateral: Secondary | ICD-10-CM | POA: Diagnosis not present

## 2021-08-03 ENCOUNTER — Ambulatory Visit (INDEPENDENT_AMBULATORY_CARE_PROVIDER_SITE_OTHER): Payer: Medicare Other | Admitting: Family Medicine

## 2021-08-03 ENCOUNTER — Encounter: Payer: Self-pay | Admitting: Family Medicine

## 2021-08-03 VITALS — BP 119/67 | HR 51 | Ht 72.0 in | Wt 322.6 lb

## 2021-08-03 DIAGNOSIS — M5136 Other intervertebral disc degeneration, lumbar region: Secondary | ICD-10-CM | POA: Diagnosis not present

## 2021-08-03 DIAGNOSIS — G894 Chronic pain syndrome: Secondary | ICD-10-CM

## 2021-08-03 DIAGNOSIS — Z0000 Encounter for general adult medical examination without abnormal findings: Secondary | ICD-10-CM

## 2021-08-03 DIAGNOSIS — Z6841 Body Mass Index (BMI) 40.0 and over, adult: Secondary | ICD-10-CM

## 2021-08-03 DIAGNOSIS — E782 Mixed hyperlipidemia: Secondary | ICD-10-CM | POA: Diagnosis not present

## 2021-08-03 DIAGNOSIS — F5101 Primary insomnia: Secondary | ICD-10-CM | POA: Diagnosis not present

## 2021-08-03 DIAGNOSIS — D500 Iron deficiency anemia secondary to blood loss (chronic): Secondary | ICD-10-CM

## 2021-08-03 DIAGNOSIS — Z8546 Personal history of malignant neoplasm of prostate: Secondary | ICD-10-CM

## 2021-08-03 DIAGNOSIS — I10 Essential (primary) hypertension: Secondary | ICD-10-CM | POA: Diagnosis not present

## 2021-08-03 DIAGNOSIS — M51369 Other intervertebral disc degeneration, lumbar region without mention of lumbar back pain or lower extremity pain: Secondary | ICD-10-CM

## 2021-08-03 DIAGNOSIS — N401 Enlarged prostate with lower urinary tract symptoms: Secondary | ICD-10-CM

## 2021-08-03 MED ORDER — TAMSULOSIN HCL 0.4 MG PO CAPS: 0.4000 mg | ORAL_CAPSULE | Freq: Every day | ORAL | 3 refills | Status: DC

## 2021-08-03 MED ORDER — ATORVASTATIN CALCIUM 10 MG PO TABS: 10.0000 mg | ORAL_TABLET | Freq: Every day | ORAL | 0 refills | Status: DC

## 2021-08-03 NOTE — Patient Instructions (Addendum)
Thank you for coming to the office today.  Future Shingles vaccine when ready 2 doses 2-6 month apart  Future Pneumonia Vaccine - Prevnar-20 then that is final dose you will need.  Will order Atorvastatin '10mg'$  low dose. Can try this, there is another one we may try in future if need.  Check into Repatha or Praluent injectable PCSK9 Cholesterol medications Usually need to have tried and failed at least 3 statins.  Let me know if need blood  Please schedule a Follow-up Appointment to: Return in about 4 months (around 12/04/2021) for 4 month follow-up Anxiety [facility] Pain.  If you have any other questions or concerns, please feel free to call the office or send a message through [place]. You may also schedule an earlier appointment if necessary.  Additionally, you may be receiving a survey about your experience at our office within a few days to 1 week by e-mail or mail. We value your feedback.  Nobie Putnam, DO [place]

## 2021-08-03 NOTE — Progress Notes (Signed)
Subjective:    Patient ID: [name], male    DOB: 16-Mar-1955, 66 y.o.   MRN: [id number]  [name] is a 66 y.o. male presenting on 08/03/2021 for Hyperlipidemia   HPI  Here for Annual Physical and Lab Review.  Mild Anemia, microcytic IDA He had issue with prostate problem caused bleeding for period of time past 2 months, he may have episodic bleeding every 6 months.  Morbid Obesity BMI >43 Hypertension Paroxsymal Atrial Fibrillation Followed by Dr Rockey Situ, [facility]  Weight down 20 lbs, approx 340s to 320s in past 6 months Previous 370 lbs 1.5 years ago   Elevated A1c Lab improved to 5.6, below range of PreDM   Generalized Body Pain Every joint in body, aching and causing pain Followed by Dr Holley Raring at [facility] Previously on Tramadol, then switched to Hydrocodone. Now holding for several days. Down to Gabapentin '400mg'$  daily RF Ablation L side, sciatica has resolved.   Hyperlipidemia Drug induced myopathy He stopped Rosuvastatin '10mg'$  and Zetia '10mg'$  on 01/30/21, and has had dramatic improvement in pain overall No other statin therapy Now last lab shows LDL up to 140 Interested to try other, caution with myalgia  Has the upcoming apt tomorrow for MRI, per Dr Holley Raring was sent to Dr Posey Pronto for Orthopedics. Difficulty working out with shoulders Impacting his sleep waking him up Back on pain medication due to it  Insomnia Anxiety Improved on Buspirone, was down to 2 then now back up to 4 since back on pain meds.   Long term problem, but has had worse issue in past several weeks to month with difficulty sleeping. Now he is only sleeping a few hours at night. Describes problem with sleep onset insomnia. He describes pain discomfort at night keeping him awake due to pain could not get comfortable and also mind active with anxiety.  Previously on Mirtazapine, Quetiapine, Belsomra  Trazodone '150mg'$  QHS also takes Tylenol PM Duloxetine  Follow up Sclerotic  Bone Lesion L5 Chronic Back Pain [facility] Dr Holley Raring   History of Prostate Cancer Previously seen by Dr Maryan Puls [facility] - treated approx 2017 by seed implant, has had negative PSA since that time and done well Recent update with CT imaging showed some abnormal sclerosis, has MRI Reassuring visit from Dr Yves Dill Last PSA <0.04, negative (07/2021) unchanged from last year. Prostate exam normal On tamsulosin for BPH needs re order  Health Maintenance:  Declines Shingles Vaccine Prevnar-20 Vaccine. At this time.      08/03/2021   10:36 AM 06/16/2021   10:27 AM 05/10/2021    4:05 PM  Depression screen PHQ 2/9  Decreased Interest 2 0 0  Down, Depressed, Hopeless 1 0 0  PHQ - 2 Score 3 0 0  Altered sleeping 3  1  Tired, decreased energy 3  0  Change in appetite 3  0  Feeling bad or failure about yourself  0  0  Trouble concentrating 2  0  Moving slowly or fidgety/restless 0  0  Suicidal thoughts 0  0  PHQ-9 Score 14  1  Difficult doing work/chores Very difficult  Not difficult at all      08/03/2021   10:36 AM 05/10/2021    4:05 PM 03/01/2021   11:01 AM 02/21/2021    9:33 AM  GAD 7 : Generalized Anxiety Score  Nervous, Anxious, on Edge 3 0 0 0  Control/stop worrying 0 0 0 0  Worry too much -  different things 1 0 0 0  Trouble relaxing 1 0 0 0  Restless 0 0 0 0  Easily annoyed or irritable 1 0 0 0  Afraid - awful might happen 0 0 0 0  Total GAD 7 Score 6 0 0 0  Anxiety Difficulty Somewhat difficult Not difficult at all Not difficult at all Not difficult at all      Past Medical History:  Diagnosis Date   Cardiomyopathy (in setting of Afib)    a. 12/2013 Echo: EF 45-50%, mild ant and antsept HK. mild MR. Mod dil LA. nl RV fxn. Rhythm was Afib.   DJD (degenerative joint disease) of knee    History of kidney stones    Intervertebral disc disorder with radiculopathy of lumbosacral region    Kidney stone 2012   Lower extremity edema    PAF (paroxysmal atrial  fibrillation) (HCC)    Pernicious anemia    Prostate cancer ([place])    Pulmonary nodule, right    a. 10/2014 Cardiac CTA: 52m RLL nodule; b. 04/2015 CT Chest: stable 766mRLL nodule. No new nodules; 02/2017 CTA Chest: stable, benign, 61m261mLL pulm nodule.   [facility]  Past [facility] History:  Procedure Laterality Date   CARDIOVERSION N/A 05/15/2019   Procedure: CARDIOVERSION;  Surgeon: GolMinna MerrittsD;  Location: [facility] ORS;  Service: Cardiovascular;  Laterality: N/A;   CARPAL TUNNEL RELEASE Right 12/23/2014   Procedure: CARPAL TUNNEL RELEASE;  Surgeon: HowEarnestine LeysD;  Location: [facility] ORS;  Service: Orthopedics;  Laterality: Right;   CARPAL TUNNEL RELEASE Left 01/06/2015   Procedure: CARPAL TUNNEL RELEASE;  Surgeon: HowEarnestine LeysD;  Location: [facility] ORS;  Service: Orthopedics;  Laterality: Left;   COLONOSCOPY WITH PROPOFOL N/A 10/08/2017   Procedure: COLONOSCOPY WITH PROPOFOL;  Surgeon: VanLin LandsmanD;  [facility];  Service: Gastroenterology;  Laterality: N/A;   GASTRIC BYPASS  2002   KNEE SURGERY     knee trauma   prostate seeding     Social History   Socioeconomic History   Marital status: Married    Spouse name: Not on file   Number of children: Not on file   Years of education: Not on file   Highest education level: Not on file  Occupational History   Not on file  Tobacco Use   Smoking status: Former    Packs/day: 1.00    Years: 10.00    Total pack years: 10.00    Types: Cigarettes    Quit date: 02/18/1979    Years since quitting: 42.4   Smokeless tobacco: Former    Types: Snuff  Vaping Use   Vaping Use: Never used  Substance and Sexual Activity   Alcohol use: Yes    Alcohol/week: 12.0 standard drinks of alcohol    Types: 12 Cans of beer per week    Comment: weekly   Drug use: No   Sexual activity: Not on file  Other Topics Concern   Not on file  Social History Narrative   Not on file   Social Determinants  of Health   Financial Resource Strain: Low Risk  (02/11/2021)   Overall Financial Resource Strain (CARDIA)    Difficulty of Paying Living Expenses: Not hard at all  Food Insecurity: No Food Insecurity (02/11/2021)   Hunger Vital Sign    Worried About Running Out of Food in the Last Year: Never true    Ran Out of Food in the Last Year: Never  true  Transportation Needs: No Transportation Needs (02/11/2021)   PRAPARE - Hydrologist (Medical): No    Lack of Transportation (Non-Medical): No  Physical Activity: Inactive (02/11/2021)   Exercise Vital Sign    Days of Exercise per Week: 0 days    Minutes of Exercise per Session: 0 min  Stress: No Stress Concern Present (02/11/2021)   [place]    Feeling of Stress : Only a little  Social Connections: Moderately Integrated (02/11/2021)   Social Connection and Isolation Panel [NHANES]    Frequency of Communication with Friends and Family: More than three times a week    Frequency of Social Gatherings with Friends and Family: More than three times a week    Attends Religious Services: More than 4 times per year    Active Member of [employer] or Organizations: No    Attends Archivist Meetings: Never    Marital Status: Married  Human resources officer Violence: Not At Risk (02/11/2021)   Humiliation, Afraid, Rape, and Kick questionnaire    Fear of Current or Ex-Partner: No    Emotionally Abused: No    Physically Abused: No    Sexually Abused: No   Family History  Problem Relation Age of Onset   Heart disease Father    Hypertension Father    Stroke Father    Cancer Father    Arrhythmia Sister        A-fib   Arrhythmia Brother        A-fib   Current Outpatient Medications on File Prior to Visit  Medication Sig   amiodarone (PACERONE) 200 MG tablet TAKE ONE TABLET BY MOUTH ONE TIME DAILY   Aspirin 325 MG CAPS Take 325 mg by mouth daily.    benazepril (LOTENSIN) 40 MG tablet TAKE 1 TABLET(40 MG) BY MOUTH DAILY   busPIRone (BUSPAR) 10 MG tablet Take 1 tablet (10 mg total) by mouth 4 (four) times daily as needed (anxiety).   carboxymethylcellulose (REFRESH PLUS) 0.5 % SOLN 1 drop 3 (three) times daily as needed.   cyanocobalamin (,VITAMIN B-12,) 1000 MCG/ML injection INJECT 1ML EVERY 30 DAYS AS DIRECTED   cyclobenzaprine (FLEXERIL) 10 MG tablet Take 10 mg by mouth as needed.   DULoxetine (CYMBALTA) 30 MG capsule Take 1 capsule (30 mg total) by mouth daily.   HYDROcodone-acetaminophen (NORCO) 7.5-325 MG tablet Take 1 tablet by mouth 3 (three) times daily as needed for severe pain. Must last 30 days   melatonin 3 MG TABS tablet Take 3 mg by mouth at bedtime.   metoprolol succinate (TOPROL-XL) 50 MG 24 hr tablet Take 0.5 tablets (25 mg total) by mouth daily. TAKE 1 TABLET(50 MG) BY MOUTH DAILY WITH OR IMMEDIATELY FOLLOWING A MEAL (Patient taking differently: Take 50 mg by mouth at bedtime. TAKE 1 TABLET(50 MG) BY MOUTH DAILY WITH OR IMMEDIATELY FOLLOWING A MEAL)   Multiple Vitamin (MULTIVITAMIN WITH MINERALS) TABS tablet Take 1 tablet by mouth daily.   rivaroxaban (XARELTO) 20 MG TABS tablet TAKE ONE TABLET BY MOUTH ONE TIME DAILY WITH SUPPER   traZODone (DESYREL) 150 MG tablet Take 1 tablet (150 mg total) by mouth at bedtime.   acetaminophen (TYLENOL) 500 MG tablet Take 1,000 mg by mouth every 6 (six) hours as needed for moderate pain or headache. (Patient not taking: Reported on 07/27/2021)   amLODipine (NORVASC) 5 MG tablet Take 1 tablet (5 mg total) by mouth daily.   diazepam (VALIUM)  5 MG tablet Take 1-2 tablets (5-10 mg total) by mouth 60 (sixty) minutes before procedure for 2 doses. Take one (1) tab (5 mg) 60 minutes before scheduled MRI Wait 30 minutes. If still anxious, take the second (5 mg) tab 30 min before MRI (Patient not taking: Reported on 06/27/2021)   fluticasone (FLONASE) 50 MCG/ACT nasal spray USE TWO SPRAYS IN THE AFFECTED  NOSTRIL ONE TIME DAILY (Patient not taking: Reported on 07/27/2021)   furosemide (LASIX) 20 MG tablet Take 1 tablet (20 mg total) by mouth daily as needed. (Patient not taking: Reported on 07/27/2021)   Ibuprofen 200 MG CAPS Take 400 mg by mouth at bedtime as needed. (Patient not taking: Reported on 07/27/2021)   No current facility-administered medications on file prior to visit.    Review of Systems  Constitutional:  Negative for activity change, appetite change, chills, diaphoresis, fatigue and fever.  HENT:  Negative for congestion and hearing loss.   Eyes:  Negative for visual disturbance.  Respiratory:  Negative for cough, chest tightness, shortness of breath and wheezing.   Cardiovascular:  Negative for chest pain, palpitations and leg swelling.  Gastrointestinal:  Negative for abdominal pain, constipation, diarrhea, nausea and vomiting.  Genitourinary:  Positive for frequency. Negative for dysuria and hematuria.  Musculoskeletal:  Positive for arthralgias and back pain. Negative for neck pain.  Skin:  Negative for rash.  Neurological:  Negative for dizziness, weakness, light-headedness, numbness and headaches.  Hematological:  Negative for adenopathy.  Psychiatric/Behavioral:  Negative for behavioral problems, dysphoric mood and sleep disturbance.    Per HPI unless specifically indicated above     Objective:    BP 119/67   Pulse (!) 51   Ht 6' (1.829 m)   Wt (!) 322 lb 9.6 oz (146.3 kg)   SpO2 97%   BMI 43.75 kg/m   Wt Readings from Last 3 Encounters:  08/03/21 (!) 322 lb 9.6 oz (146.3 kg)  06/27/21 (!) 324 lb (147 kg)  06/16/21 (!) 324 lb (147 kg)    Physical Exam Vitals and nursing note reviewed.  Constitutional:      General: He is not in acute distress.    Appearance: He is well-developed. He is obese. He is not diaphoretic.     Comments: Well-appearing, comfortable, cooperative  HENT:     Head: Normocephalic and atraumatic.  Eyes:     General:        Right  eye: No discharge.        Left eye: No discharge.     Conjunctiva/sclera: Conjunctivae normal.     Pupils: Pupils are equal, round, and reactive to light.  Neck:     Thyroid: No thyromegaly.  Cardiovascular:     Rate and Rhythm: Normal rate and regular rhythm.     Pulses: Normal pulses.     Heart sounds: Normal heart sounds. No murmur heard. Pulmonary:     Effort: Pulmonary effort is normal. No respiratory distress.     Breath sounds: Normal breath sounds. No wheezing or rales.  Abdominal:     General: Bowel sounds are normal. There is no distension.     Palpations: Abdomen is soft. There is no mass.     Tenderness: There is no abdominal tenderness.  Musculoskeletal:        General: No tenderness. Normal range of motion.     Cervical back: Normal range of motion and neck supple.     Right lower leg: No edema.     Left  lower leg: No edema.     Comments: Upper / Lower Extremities: - Normal muscle tone, strength bilateral upper extremities 5/5, lower extremities 5/5  Lymphadenopathy:     Cervical: No cervical adenopathy.  Skin:    General: Skin is warm and dry.     Findings: No erythema or rash.  Neurological:     Mental Status: He is alert and oriented to person, place, and time.     Comments: Distal sensation intact to light touch all extremities  Psychiatric:        Mood and Affect: Mood normal.        Behavior: Behavior normal.        Thought Content: Thought content normal.     Comments: Well groomed, good eye contact, normal speech and thoughts      Results for orders placed or performed in visit on 07/27/21  Comprehensive Metabolic Panel (CMET)  Result Value Ref Range   Glucose, Bld 98 65 - 99 mg/dL   BUN 15 7 - 25 mg/dL   Creat 1.13 0.70 - 1.35 mg/dL   BUN/Creatinine Ratio NOT APPLICABLE 6 - 22 (calc)   Sodium 142 135 - 146 mmol/L   Potassium 5.0 3.5 - 5.3 mmol/L   Chloride 107 98 - 110 mmol/L   CO2 27 20 - 32 mmol/L   Calcium 9.3 8.6 - 10.3 mg/dL   Total  Protein 6.3 6.1 - 8.1 g/dL   Albumin 4.2 3.6 - 5.1 g/dL   Globulin 2.1 1.9 - 3.7 g/dL (calc)   AG Ratio 2.0 1.0 - 2.5 (calc)   Total Bilirubin 0.4 0.2 - 1.2 mg/dL   Alkaline phosphatase (APISO) 55 35 - 144 U/L   AST 12 10 - 35 U/L   ALT 10 9 - 46 U/L  CBC with Differential  Result Value Ref Range   WBC 7.2 3.8 - 10.8 Thousand/uL   RBC 5.08 4.20 - 5.80 Million/uL   Hemoglobin 12.6 (L) 13.2 - 17.1 g/dL   HCT 40.5 38.5 - 50.0 %   MCV 79.7 (L) 80.0 - 100.0 fL   MCH 24.8 (L) 27.0 - 33.0 pg   MCHC 31.1 (L) 32.0 - 36.0 g/dL   RDW 15.3 (H) 11.0 - 15.0 %   Platelets 244 140 - 400 Thousand/uL   MPV 9.4 7.5 - 12.5 fL   Neutro Abs 4,162 1,500 - 7,800 cells/uL   Lymphs Abs 2,225 850 - 3,900 cells/uL   Absolute Monocytes 713 200 - 950 cells/uL   Eosinophils Absolute 58 15 - 500 cells/uL   Basophils Absolute 43 0 - 200 cells/uL   Neutrophils Relative % 57.8 %   Total Lymphocyte 30.9 %   Monocytes Relative 9.9 %   Eosinophils Relative 0.8 %   Basophils Relative 0.6 %  HgB A1c  Result Value Ref Range   Hgb A1c MFr Bld 5.6 <5.7 % of total Hgb   Mean Plasma Glucose 114 mg/dL   eAG (mmol/L) 6.3 mmol/L  PSA  Result Value Ref Range   PSA <0.04 < OR = 4.00 ng/mL  TSH  Result Value Ref Range   TSH 1.43 0.40 - 4.50 mIU/L  Lipid panel  Result Value Ref Range   Cholesterol 225 (H) <200 mg/dL   HDL 66 > OR = 40 mg/dL   Triglycerides 93 <150 mg/dL   LDL Cholesterol (Calc) 140 (H) mg/dL (calc)   Total CHOL/HDL Ratio 3.4 <5.0 (calc)   Non-HDL Cholesterol (Calc) 159 (H) <130 mg/dL (calc)  Assessment & Plan:   Problem List Items Addressed This Visit     Chronic pain syndrome   Essential hypertension   Relevant Medications   atorvastatin (LIPITOR) 10 MG tablet   History of prostate cancer   Insomnia   Lumbar degenerative disc disease   Mixed hyperlipidemia   Relevant Medications   atorvastatin (LIPITOR) 10 MG tablet   Morbid obesity with BMI of 50.0-59.9, adult ([place])   Other  Visit Diagnoses     Annual physical exam    -  Primary   Iron deficiency anemia due to chronic blood loss       Benign prostatic hyperplasia with lower urinary tract symptoms, symptom details unspecified       Relevant Medications   tamsulosin (FLOMAX) 0.4 MG CAPS capsule       Updated Health Maintenance information Reviewed recent lab results with patient Encouraged improvement to lifestyle with diet and exercise Goal of weight loss  Future Shingles vaccine when ready 2 doses 2-6 month apart  Future Pneumonia Vaccine - Prevnar-20 then that is final dose you will need.  HLD Drug induced myopathy LDL >140 now, off statin Failed Rosuvastatin Will order Atorvastatin '10mg'$  low dose. Nightly Consider Pravastatin next if need Check into Repatha or Praluent injectable PCSK9 Cholesterol medications Usually need to have tried and failed at least 3 statins.  BPH S/p Prostate CA Followed by Dr [facility] recently, now no longer. Has had PSA < 0.04 on lab and normal DRE Remains without concerns despite recent CT with sclerosis on bone, no further concerns based on re imaging. Keep on Tamsulosin will re order.  Meds ordered this encounter  Medications   tamsulosin (FLOMAX) 0.4 MG CAPS capsule    Sig: Take 1 capsule (0.4 mg total) by mouth daily after breakfast.    Dispense:  90 capsule    Refill:  3   atorvastatin (LIPITOR) 10 MG tablet    Sig: Take 1 tablet (10 mg total) by mouth at bedtime.    Dispense:  30 tablet    Refill:  0      Follow up plan: Return in about 4 months (around 12/04/2021) for 4 month follow-up Anxiety HLD Pain.  Nobie Putnam, [facility] 08/03/2021, 10:58 AM

## 2021-08-04 DIAGNOSIS — M25512 Pain in left shoulder: Secondary | ICD-10-CM | POA: Diagnosis not present

## 2021-08-04 DIAGNOSIS — M25511 Pain in right shoulder: Secondary | ICD-10-CM | POA: Diagnosis not present

## 2021-08-05 ENCOUNTER — Other Ambulatory Visit: Payer: Self-pay | Admitting: Orthopedic Surgery

## 2021-08-05 DIAGNOSIS — M25511 Pain in right shoulder: Secondary | ICD-10-CM

## 2021-08-19 ENCOUNTER — Other Ambulatory Visit: Payer: Self-pay | Admitting: Family Medicine

## 2021-08-19 DIAGNOSIS — E782 Mixed hyperlipidemia: Secondary | ICD-10-CM

## 2021-08-19 NOTE — Telephone Encounter (Signed)
Requested Prescriptions  Pending Prescriptions Disp Refills  . atorvastatin (LIPITOR) 10 MG tablet [Pharmacy Med Name: ATORVASTATIN 10 MG TAB] 30 tablet 0    Sig: TAKE ONE TABLET BY MOUTH AT BEDTIME     Cardiovascular:  Antilipid - Statins Failed - 08/19/2021  3:48 PM      Failed - Lipid Panel in normal range within the last 12 months    Cholesterol, Total  Date Value Ref Range Status  08/01/2019 158 100 - 199 mg/dL Final   Cholesterol  Date Value Ref Range Status  07/27/2021 225 (H) <200 mg/dL Final   Cholesterol Piccolo, Waived  Date Value Ref Range Status  12/04/2016 195 <200 mg/dL Final    Comment:                            Desirable                <200                         Borderline High      200- 239                         High                     >239    LDL Cholesterol (Calc)  Date Value Ref Range Status  07/27/2021 140 (H) mg/dL (calc) Final    Comment:    Reference range: <100 . Desirable range <100 mg/dL for primary prevention;   <70 mg/dL for patients with CHD or diabetic patients  with > or = 2 CHD risk factors. Marland Kitchen LDL-C is now calculated using the Martin-Hopkins  calculation, which is a validated novel method providing  better accuracy than the Friedewald equation in the  estimation of LDL-C.  Cresenciano Genre et al. Annamaria Helling. 7989;211(94): 2061-2068  (http://education.[employer].com/faq/FAQ164)    HDL  Date Value Ref Range Status  07/27/2021 66 > OR = 40 mg/dL Final  08/01/2019 73 >39 mg/dL Final   Triglycerides  Date Value Ref Range Status  07/27/2021 93 <150 mg/dL Final   Triglycerides Piccolo,Waived  Date Value Ref Range Status  12/04/2016 147 <150 mg/dL Final    Comment:                            Normal                   <150                         Borderline High     150 - 199                         High                200 - 499                         Very High                >499          Passed - Patient is not pregnant       Passed - Valid encounter within last 12 months  Recent Outpatient Visits          2 weeks ago Annual physical exam   [facility] Olin Hauser, DO   2 months ago Lumbar degenerative disc disease   [place], DO   3 months ago [place], DO   5 months ago Primary insomnia   [place], DO   5 months ago Mixed hyperlipidemia   [place], DO      Future Appointments            In 3 months [place], Devonne Doughty, DO [facility]

## 2021-08-21 ENCOUNTER — Other Ambulatory Visit: Payer: Self-pay | Admitting: Cardiovascular Disease

## 2021-08-21 DIAGNOSIS — I10 Essential (primary) hypertension: Secondary | ICD-10-CM

## 2021-08-23 ENCOUNTER — Encounter: Payer: Self-pay | Admitting: Student in an Organized Health Care Education/Training Program

## 2021-08-23 ENCOUNTER — Ambulatory Visit
Payer: Medicare Other | Attending: Student in an Organized Health Care Education/Training Program | Admitting: Student in an Organized Health Care Education/Training Program

## 2021-08-23 VITALS — BP 116/88 | HR 56 | Temp 97.1°F | Ht 66.0 in | Wt 322.0 lb

## 2021-08-23 DIAGNOSIS — M25512 Pain in left shoulder: Secondary | ICD-10-CM | POA: Insufficient documentation

## 2021-08-23 DIAGNOSIS — G8929 Other chronic pain: Secondary | ICD-10-CM | POA: Diagnosis not present

## 2021-08-23 DIAGNOSIS — M19012 Primary osteoarthritis, left shoulder: Secondary | ICD-10-CM | POA: Insufficient documentation

## 2021-08-23 DIAGNOSIS — M47816 Spondylosis without myelopathy or radiculopathy, lumbar region: Secondary | ICD-10-CM | POA: Insufficient documentation

## 2021-08-23 DIAGNOSIS — M19011 Primary osteoarthritis, right shoulder: Secondary | ICD-10-CM | POA: Insufficient documentation

## 2021-08-23 DIAGNOSIS — M170 Bilateral primary osteoarthritis of knee: Secondary | ICD-10-CM | POA: Insufficient documentation

## 2021-08-23 DIAGNOSIS — G894 Chronic pain syndrome: Secondary | ICD-10-CM | POA: Diagnosis not present

## 2021-08-23 DIAGNOSIS — M25511 Pain in right shoulder: Secondary | ICD-10-CM | POA: Insufficient documentation

## 2021-08-23 MED ORDER — HYDROCODONE-ACETAMINOPHEN 7.5-325 MG PO TABS: 1.0000 | ORAL_TABLET | Freq: Four times a day (QID) | ORAL | 0 refills | Status: DC | PRN

## 2021-08-23 NOTE — Progress Notes (Signed)
PROVIDER NOTE: Information contained herein reflects review and annotations entered in association with encounter. Interpretation of such information and data should be left to medically-trained personnel. Information provided to patient can be located elsewhere in the medical record under "Patient Instructions". Document created using STT-dictation technology, any transcriptional errors that may result from process are unintentional.    Patient: [name]  Service Category: E/M  Provider: Gillis Santa, MD  DOB: 19-Jun-1955  DOS: 08/23/2021  Referring Provider: Nobie Putnam *  MRN: [id number]  Specialty: Interventional Pain Management  PCP: Olin Hauser, DO  Type: Established Patient  Setting: Ambulatory outpatient    Location: Office  Delivery: Face-to-face     HPI  [name], a 66 y.o. year old male, is here today because of his Localized osteoarthritis of shoulder regions, bilateral [M19.011, M19.012]. [name] primary complain today is Shoulder Pain (both) Last encounter: My last encounter with him was on 07/28/2021. Pertinent problems: [name] has H/O gastric bypass; DJD (degenerative joint disease) of knee; Bilateral carpal tunnel syndrome; Paroxysmal atrial fibrillation ([place]); Chronic radicular lumbar pain; Lumbar radiculopathy; Lumbar degenerative disc disease; Lumbar facet arthropathy; Lumbar facet joint syndrome; Primary osteoarthritis of both wrists; Trigger middle finger of left hand; and Chronic pain syndrome on their pertinent problem list. Pain Assessment: Severity of Chronic pain is reported as a 3 /10. Location: Shoulder Left, Right/pain radiatiesywhere. Onset: More than a month ago. Quality: Aching, Burning, Constant, Throbbing, Shooting, Stabbing. Timing: Constant. Modifying factor(s): Meds, sitting down, physical therapy. Vitals:  height is _0  (1.676 m) and weight is 322 lb (146.1 kg) (abnormal). His temperature is 97.1 F (36.2 C) (abnormal). His  blood pressure is 116/88 and his pulse is 56 (abnormal). His oxygen saturation is 99%.   Reason for encounter: medication management.  Mr. Volner is also experiencing worsening bilateral shoulder pain.  Since his last clinic visit with me, he was seen by Dr. Leim Fabry with orthopedics.  Shoulder MRI was ordered which she is having done next week.  He states that he is having severe bilateral shoulder pain which makes it difficult for him to do anything that requires overhead reach.  He has had intra-articular glenohumeral joint injections with limited response.  He is currently on hydrocodone 7.5 mg every 8 hours which she states is helping somewhat to help manage his pain but he is requesting an extra tablet that he can take at night when he has severe breakthrough pain and to also help him with sleep.  He is pretty frustrated with his shoulder pain.  Pharmacotherapy Assessment  Analgesic:  hydrocodone 7.5 mg TID--> QID prn given increased bilateral shoulder pain    Monitoring: [place] PMP: PDMP reviewed during this encounter.       Pharmacotherapy: No side-effects or adverse reactions reported. Compliance: No problems identified. Effectiveness: Clinically acceptable.  Chauncey Fischer, RN  08/23/2021  1:06 PM  Sign when Signing Visit Nursing Pain Medication Assessment:  Safety precautions to be maintained throughout the outpatient stay will include: orient to surroundings, keep bed in low position, maintain call bell within reach at all times, provide assistance with transfer out of bed and ambulation.  Medication Inspection Compliance: Pill count conducted under aseptic conditions, in front of the patient. Neither the pills nor the bottle was removed from the patient's sight at any time. Once count was completed pills were immediately returned to the patient in their original bottle.  Medication: Hydrocodone/APAP Pill/Patch Count:  3 of 90 pills remain  Pill/Patch Appearance: Markings consistent  with prescribed medication Bottle Appearance: Standard pharmacy container. Clearly labeled. Filled Date: 7 / 10 / 2023 Last Medication intake:  TodaySafety precautions to be maintained throughout the outpatient stay will include: orient to surroundings, keep bed in low position, maintain call bell within reach at all times, provide assistance with transfer out of bed and ambulation.     UDS:  Summary  Date Value Ref Range Status  04/06/2020 Note  Final    Comment:    ==================================================================== [employer] Select 13 (MW) ==================================================================== Test                             Result       Flag       Units  Drug Present and Declared for Prescription Verification   Tramadol                       >4386        EXPECTED   ng/mg creat   O-Desmethyltramadol            3353         EXPECTED   ng/mg creat   N-Desmethyltramadol            >4386        EXPECTED   ng/mg creat    Source of tramadol is a prescription medication. O-desmethyltramadol    and N-desmethyltramadol are expected metabolites of tramadol.  ==================================================================== Test                      Result    Flag   Units      Ref Range   Creatinine              114              mg/dL      >=20 ==================================================================== Declared Medications:  The flagging and interpretation on this report are based on the  following declared medications.  Unexpected results may arise from  inaccuracies in the declared medications.   **Note: The testing scope of this panel includes these medications:   Tramadol   **Note: The testing scope of this panel does not include the  following reported medications:   Acetaminophen (Tylenol)  Amiodarone  Amlodipine  Benazepril  Cyclobenzaprine (Flexeril)  Ezetimibe  Fluticasone  Furosemide  Gabapentin  Menthol  Methyl Salicylate  (topical)  Metoprolol  Mirtazapine  Multivitamin  Rivaroxaban  Rosuvastatin  Trazodone  Vitamin B12 ==================================================================== For clinical consultation, please call [phone]. ====================================================================      ROS  Constitutional: Denies any fever or chills Gastrointestinal: No reported hemesis, hematochezia, vomiting, or acute GI distress Musculoskeletal:  Bilateral shoulder pain Neurological: No reported episodes of acute onset apraxia, aphasia, dysarthria, agnosia, amnesia, paralysis, loss of coordination, or loss of consciousness  Medication Review  Aspirin, DULoxetine, HYDROcodone-acetaminophen, amLODipine, amiodarone, benazepril, busPIRone, carboxymethylcellulose, cyanocobalamin, cyclobenzaprine, furosemide, melatonin, metoprolol succinate, multivitamin with minerals, rivaroxaban, tamsulosin, and traZODone  History Review  Allergy: [name] has No Known Allergies. Drug: [name]  reports no history of drug use. Alcohol:  reports current alcohol use of about 12.0 standard drinks of alcohol per week. Tobacco:  reports that he quit smoking about 42 years ago. His smoking use included cigarettes. He has a 10.00 pack-year smoking history. He has quit using smokeless tobacco.  His smokeless tobacco use included snuff. Social: Mr. Glascoe  reports  that he quit smoking about 42 years ago. His smoking use included cigarettes. He has a 10.00 pack-year smoking history. He has quit using smokeless tobacco.  His smokeless tobacco use included snuff. He reports current alcohol use of about 12.0 standard drinks of alcohol per week. He reports that he does not use drugs. Medical:  has a past medical history of Cardiomyopathy (in setting of Afib), DJD (degenerative joint disease) of knee, History of kidney stones, Intervertebral disc disorder with radiculopathy of lumbosacral region, Kidney stone (2012),  Lower extremity edema, PAF (paroxysmal atrial fibrillation) ([place]), Pernicious anemia, Prostate cancer ([place]), Pulmonary nodule, right, and SVT (supraventricular tachycardia) ([place]). Surgical: [name]  has a past surgical history that includes Knee surgery; Gastric bypass (2002); prostate seeding; Carpal tunnel release (Right, 12/23/2014); Carpal tunnel release (Left, 01/06/2015); Colonoscopy with propofol (N/A, 10/08/2017); and Cardioversion (N/A, 05/15/2019). Family: family history includes Arrhythmia in his brother and sister; Cancer in his father; Heart disease in his father; Hypertension in his father; Stroke in his father.  Laboratory Chemistry Profile   Renal Lab Results  Component Value Date   BUN 15 07/27/2021   CREATININE 1.13 31/54/0086   BCR NOT APPLICABLE 76/19/5093   GFRAA 72 07/21/2020   GFRNONAA 62 07/21/2020    Hepatic Lab Results  Component Value Date   AST 12 07/27/2021   ALT 10 07/27/2021   ALBUMIN 4.6 08/01/2019   ALKPHOS 72 08/01/2019   LIPASE 28 03/13/2017    Electrolytes Lab Results  Component Value Date   NA 142 07/27/2021   K 5.0 07/27/2021   CL 107 07/27/2021   CALCIUM 9.3 07/27/2021   MG 2.0 02/01/2021    Bone Lab Results  Component Value Date   25OHVITD1 32 02/01/2021   25OHVITD2 <1.0 02/01/2021   25OHVITD3 32 02/01/2021    Inflammation (CRP: Acute Phase) (ESR: Chronic Phase) No results found for: "CRP", "ESRSEDRATE", "LATICACIDVEN"       Note: Above Lab results reviewed.   Physical Exam  General appearance: Well nourished, well developed, and well hydrated. In no apparent acute distress Mental status: Alert, oriented x 3 (person, place, & time)       Respiratory: No evidence of acute respiratory distress Eyes: PERLA Vitals: BP 116/88   Pulse (!) 56   Temp (!) 97.1 F (36.2 C)   Ht _0  (1.676 m)   Wt (!) 322 lb (146.1 kg)   SpO2 99%   BMI 51.97 kg/m  BMI: Estimated body mass index is 51.97 kg/m as calculated from the following:    Height as of this encounter: _1  (1.676 m).   Weight as of this encounter: 322 lb (146.1 kg). Ideal: Ideal body weight: 63.8 kg (140 lb 10.5 oz) Adjusted ideal body weight: 96.7 kg (213 lb 3.1 oz)  Right Shoulder Exam   Tenderness  The patient is experiencing tenderness in the acromioclavicular joint and acromion.  Range of Motion  Active abduction:  abnormal  Passive abduction:  abnormal  External rotation:  abnormal  Internal rotation 0 degrees:  abnormal  Internal rotation 90 degrees:  abnormal   Muscle Strength  The patient has normal right shoulder strength (limited by pain).  Other  Erythema: absent Sensation: normal Pulse: present   Left Shoulder Exam   Tenderness  The patient is experiencing tenderness in the acromioclavicular joint and acromion.  Range of Motion  Active abduction:  abnormal  Passive abduction:  abnormal  External rotation:  abnormal  Internal rotation 0 degrees:  abnormal  Internal rotation 90 degrees:  abnormal   Muscle Strength  The patient has normal left shoulder strength.  Other  Erythema: absent Sensation: normal Pulse: present        Assessment   Diagnosis Status  1. Localized osteoarthritis of shoulder regions, bilateral   2. Chronic pain of both shoulders   3. Bilateral primary osteoarthritis of knee   4. Lumbar facet arthropathy   5. Chronic pain syndrome    Persistent Persistent Persistent    Plan of Care    [name] has a current medication list which includes the following long-term medication(s): amiodarone, benazepril, duloxetine, furosemide, metoprolol succinate, xarelto, trazodone, and amlodipine.  Pharmacotherapy (Medications Ordered): Meds ordered this encounter  Medications   HYDROcodone-acetaminophen (NORCO) 7.5-325 MG tablet    Sig: Take 1 tablet by mouth every 6 (six) hours as needed for severe pain. Must last 30 days    Dispense:  120 tablet    Refill:  0    Chronic Pain: STOP Act  (Not applicable) Fill 1 day early if closed on refill date. Avoid benzodiazepines within 8 hours of opioids   HYDROcodone-acetaminophen (NORCO) 7.5-325 MG tablet    Sig: Take 1 tablet by mouth every 6 (six) hours as needed for severe pain. Must last 30 days    Dispense:  120 tablet    Refill:  0    Chronic Pain: STOP Act (Not applicable) Fill 1 day early if closed on refill date. Avoid benzodiazepines within 8 hours of opioids   HYDROcodone-acetaminophen (NORCO) 7.5-325 MG tablet    Sig: Take 1 tablet by mouth every 6 (six) hours as needed for severe pain. Must last 30 days    Dispense:  120 tablet    Refill:  0    Chronic Pain: STOP Act (Not applicable) Fill 1 day early if closed on refill date. Avoid benzodiazepines within 8 hours of opioids   Orders:  Orders Placed This Encounter  Procedures   [employer] Select 13 (MW), Urine    Volume: 30 ml(s). Minimum 3 ml of urine is needed. Document temperature of fresh sample. Indications: Long term (current) use of opiate analgesic [phone])    Order Specific Question:   Release to patient    Answer:   Immediate   Follow-up plan:   Return in about 3 months (around 11/23/2021) for Medication Management, in person.    Recent Visits Date Type Provider Dept  07/28/21 Office Visit Gillis Santa, MD [facility]  06/27/21 Procedure visit Gillis Santa, MD [facility]  06/16/21 Office Visit Gillis Santa, MD [facility]  Showing recent visits within past 90 days and meeting all other requirements Today's Visits Date Type Provider Dept  08/23/21 Office Visit Gillis Santa, MD [facility]  Showing today's visits and meeting all other requirements Future Appointments Date Type Provider Dept  11/15/21 Appointment Gillis Santa, MD [facility]  Showing future appointments within next 90 days and meeting all other requirements  I discussed the assessment and treatment plan with the patient. The  patient was provided an opportunity to ask questions and all were answered. The patient agreed with the plan and demonstrated an understanding of the instructions.  Patient advised to call back or seek an in-person evaluation if the symptoms or condition worsens.  Duration of encounter: 70mnutes.  Total time on encounter, as per AMA guidelines included both the face-to-face and non-face-to-face time personally spent by the physician and/or other qualified health care professional(s) on  the day of the encounter (includes time in activities that require the physician or other qualified health care professional and does not include time in activities normally performed by clinical staff). Physician's time may include the following activities when performed: preparing to see the patient (eg, review of tests, pre-charting review of records) obtaining and/or reviewing separately obtained history performing a medically appropriate examination and/or evaluation counseling and educating the patient/family/caregiver ordering medications, tests, or procedures referring and communicating with other health care professionals (when not separately reported) documenting clinical information in the electronic or other health record independently interpreting results (not separately reported) and communicating results to the patient/ family/caregiver care coordination (not separately reported)  Note by: Gillis Santa, MD Date: 08/23/2021; Time: 1:39 PM

## 2021-08-23 NOTE — Progress Notes (Signed)
Nursing Pain Medication Assessment:  Safety precautions to be maintained throughout the outpatient stay will include: orient to surroundings, keep bed in low position, maintain call bell within reach at all times, provide assistance with transfer out of bed and ambulation.  Medication Inspection Compliance: Pill count conducted under aseptic conditions, in front of the patient. Neither the pills nor the bottle was removed from the patient's sight at any time. Once count was completed pills were immediately returned to the patient in their original bottle.  Medication: Hydrocodone/APAP Pill/Patch Count:  3 of 90 pills remain Pill/Patch Appearance: Markings consistent with prescribed medication Bottle Appearance: Standard pharmacy container. Clearly labeled. Filled Date: 7 / 10 / 2023 Last Medication intake:  TodaySafety precautions to be maintained throughout the outpatient stay will include: orient to surroundings, keep bed in low position, maintain call bell within reach at all times, provide assistance with transfer out of bed and ambulation.

## 2021-08-24 ENCOUNTER — Other Ambulatory Visit: Payer: Self-pay | Admitting: Student in an Organized Health Care Education/Training Program

## 2021-08-24 ENCOUNTER — Other Ambulatory Visit: Payer: Self-pay | Admitting: Cardiovascular Disease

## 2021-08-26 LAB — TOXASSURE SELECT 13 (MW), URINE

## 2021-08-31 ENCOUNTER — Ambulatory Visit
Admission: RE | Admit: 2021-08-31 | Discharge: 2021-08-31 | Disposition: A | Discharge status: Home / Self Care | Payer: Medicare Other | Source: Ambulatory Visit | Attending: Orthopedic Surgery | Admitting: Orthopedic Surgery

## 2021-08-31 ENCOUNTER — Other Ambulatory Visit: Payer: Self-pay | Admitting: Orthopedic Surgery

## 2021-08-31 ENCOUNTER — Ambulatory Visit: Payer: Medicare Other

## 2021-08-31 DIAGNOSIS — M25511 Pain in right shoulder: Secondary | ICD-10-CM

## 2021-08-31 DIAGNOSIS — M25512 Pain in left shoulder: Secondary | ICD-10-CM | POA: Insufficient documentation

## 2021-08-31 DIAGNOSIS — M19012 Primary osteoarthritis, left shoulder: Secondary | ICD-10-CM | POA: Diagnosis not present

## 2021-08-31 DIAGNOSIS — M25412 Effusion, left shoulder: Secondary | ICD-10-CM | POA: Diagnosis not present

## 2021-08-31 DIAGNOSIS — M65812 Other synovitis and tenosynovitis, left shoulder: Secondary | ICD-10-CM | POA: Diagnosis not present

## 2021-08-31 DIAGNOSIS — M75102 Unspecified rotator cuff tear or rupture of left shoulder, not specified as traumatic: Secondary | ICD-10-CM | POA: Diagnosis not present

## 2021-09-05 ENCOUNTER — Encounter: Payer: Self-pay | Admitting: Student in an Organized Health Care Education/Training Program

## 2021-09-06 ENCOUNTER — Other Ambulatory Visit (HOSPITAL_COMMUNITY): Payer: Self-pay | Admitting: Orthopedic Surgery

## 2021-09-06 ENCOUNTER — Encounter: Payer: Self-pay | Admitting: Student in an Organized Health Care Education/Training Program

## 2021-09-06 ENCOUNTER — Other Ambulatory Visit: Payer: Self-pay | Admitting: Orthopedic Surgery

## 2021-09-06 ENCOUNTER — Ambulatory Visit
Payer: Medicare Other | Attending: Student in an Organized Health Care Education/Training Program | Admitting: Student in an Organized Health Care Education/Training Program

## 2021-09-06 DIAGNOSIS — G8929 Other chronic pain: Secondary | ICD-10-CM

## 2021-09-06 DIAGNOSIS — M75112 Incomplete rotator cuff tear or rupture of left shoulder, not specified as traumatic: Secondary | ICD-10-CM | POA: Diagnosis not present

## 2021-09-06 DIAGNOSIS — M5416 Radiculopathy, lumbar region: Secondary | ICD-10-CM

## 2021-09-06 DIAGNOSIS — M75121 Complete rotator cuff tear or rupture of right shoulder, not specified as traumatic: Secondary | ICD-10-CM | POA: Diagnosis not present

## 2021-09-06 NOTE — Progress Notes (Signed)
Patient: [name]  Service Category: E/M  Provider: Gillis Santa, MD  DOB: 1955/12/12  DOS: 09/06/2021  Location: Office  MRN: [id number]  Setting: Ambulatory outpatient  Referring Provider: Nobie Putnam *  Type: Established Patient  Specialty: Interventional Pain Management  PCP: Olin Hauser, DO  Location: Remote location  Delivery: TeleHealth     Virtual Encounter - Pain Management PROVIDER NOTE: Information contained herein reflects review and annotations entered in association with encounter. Interpretation of such information and data should be left to medically-trained personnel. Information provided to patient can be located elsewhere in the medical record under "Patient Instructions". Document created using STT-dictation technology, any transcriptional errors that may result from process are unintentional.    Contact & Pharmacy Preferred: [phone] Home: [phone] (home) Mobile: [phone] (mobile) E-mail: [email or web address]  [facility], [facility] AT [address] [place] [address] Phone: [phone] Fax: [phone]  [id number] [place] [id number]: [phone]: [phone]  Pre-screening  [name] offered "in-person" vs "virtual" encounter. He indicated preferring virtual for this encounter.   Reason COVID-19*  Social distancing based on CDC and AMA recommendations.   I contacted [name] 09/06/2021 via telephone.      I clearly identified myself as Wayne Santa MD. I verified that I was speaking with the correct person using two identifiers (Name: [name] and date of birth: [birth year].  Consent I sought verbal advanced consent from Wayne Hansenfor virtual visit interactions. I informed [name], risks, and limitations associated with providing  "not-in-person" medical evaluation and management services. I also informed [name] the availability of "in-person" appointments. Finally, I informed him that there would be a charge for the virtual visit and that he could be  personally, fully or partially, financially responsible for it. [name] understanding and agreed to proceed.   Historic Elements   Mr. RJATHNIEL SMELTZERis a 66y.o. year old, male patient evaluated today after our last contact on 08/24/2021. [name] has a past medical history of Cardiomyopathy (in setting of Afib), DJD (degenerative joint disease) of knee, History of kidney stones, Intervertebral disc disorder with radiculopathy of lumbosacral region, Kidney stone (2012), Lower extremity edema, PAF (paroxysmal atrial fibrillation) ([place], Pernicious anemia, Prostate cancer ([place], Pulmonary nodule, right, and SVT (supraventricular tachycardia) ([place]. He also  has a past surgical history that includes Knee surgery; Gastric bypass (2002); prostate seeding; Carpal tunnel release (Right, 12/23/2014); Carpal tunnel release (Left, 01/06/2015); Colonoscopy with propofol (N/A, 10/08/2017); and Cardioversion (N/A, 05/15/2019). [name] a current medication list which includes the following prescription(s): amiodarone, aspirin, benazepril, buspirone, carboxymethylcellulose, cyanocobalamin, cyclobenzaprine, duloxetine, furosemide, hydrocodone-acetaminophen, [START ON 09/22/2021] hydrocodone-acetaminophen, [START ON 10/22/2021] hydrocodone-acetaminophen, melatonin, metoprolol succinate, multivitamin with minerals, xarelto, tamsulosin, trazodone, and amlodipine. He  reports that he quit smoking about 42 years ago. His smoking use included cigarettes. He has a 10.00 pack-year smoking history. He has quit using smokeless tobacco.  His smokeless tobacco use included snuff. He reports current alcohol use of about 12.0 standard drinks of alcohol per week. He reports that he does not use drugs.  Mr. FLaurelhas No Known Allergies.   HPI  Today, he is being contacted for worsening of previously known (established) problem  Patient is having increased low back pain with radiation  into his left buttock hip and groin in a dermatomal fashion.  He is status post left L3-L4 epidural steroid injection in June 2023 that provided him with over 75% pain relief for about 12 months.  Given return of his left radicular pain, we discussed repeating left lumbar epidural steroid injection.  Risk and benefits reviewed patient would like to proceed.  I have instructed him to discontinue his Xarelto 3 days prior.  I have also instructed him to reduce his aspirin from 325 to 81 mg 7 days prior to his epidural.  Of note, he did see Dr. Posey Pronto with orthopedics and is scheduled to have right reverse shoulder replacement on September 19.  He states that he would like to get his sciatica pain better managed with epidural before he goes for his shoulder surgery.    Laboratory Chemistry Profile   Renal Lab Results  Component Value Date   BUN 15 07/27/2021   CREATININE 1.13 29/56/2130   BCR NOT APPLICABLE 86/57/8469   GFRAA 72 07/21/2020   GFRNONAA 62 07/21/2020    Hepatic Lab Results  Component Value Date   AST 12 07/27/2021   ALT 10 07/27/2021   ALBUMIN 4.6 08/01/2019   ALKPHOS 72 08/01/2019   LIPASE 28 03/13/2017    Electrolytes Lab Results  Component Value Date   NA 142 07/27/2021   K 5.0 07/27/2021   CL 107 07/27/2021   CALCIUM 9.3 07/27/2021   MG 2.0 02/01/2021    Bone Lab Results  Component Value Date   25OHVITD1 32 02/01/2021   25OHVITD2 <1.0 02/01/2021   25OHVITD3 32 02/01/2021    Inflammation (CRP: Acute Phase) (ESR: Chronic Phase) No results found for: "CRP", "ESRSEDRATE", "LATICACIDVEN"       Note: Above Lab results reviewed.  Imaging  MR SHOULDER LEFT WO CONTRAST CLINICAL DATA:  Left shoulder pain with decreased range of motion  EXAM: MRI OF THE LEFT SHOULDER WITHOUT  CONTRAST  TECHNIQUE: Multiplanar, multisequence MR imaging of the shoulder was performed. No intravenous contrast was administered.  COMPARISON:  X-ray 03/29/2021  FINDINGS: Rotator cuff: Severe tendinosis of the supraspinatus, infraspinatus, and subscapularis tendons. Low-grade insertional tearing near the supraspinatus-infraspinatus tendon interdigitation. No full-thickness or retracted rotator cuff tear. Teres minor intact.  Muscles: Mild supraspinatus and subscapularis muscle atrophy. Mild edema within the supraspinatus muscle.  Biceps long head: Intra-articular biceps tendinosis with moderate tenosynovitis.  Acromioclavicular Joint: Moderate-severe degenerative changes of the AC joint. No significant subacromial-subdeltoid bursal fluid.  Glenohumeral Joint: Trace glenohumeral joint effusion. Mild diffuse chondral thinning.  Labrum: Labrum appears severely degenerated, most pronounced superiorly and posteriorly.  Bones: No acute fracture. No dislocation. No bone marrow edema. No marrow replacing bone lesion.  Other: None.  IMPRESSION: Left shoulder:  1. Severe rotator cuff tendinosis. Low-grade insertional tearing of the supraspinatus-infraspinatus tendon interdigitation. No full-thickness or retracted rotator cuff tear. 2. Intra-articular biceps tendinosis with moderate tenosynovitis. 3. Moderate-severe acromioclavicular and mild glenohumeral osteoarthritis.  Electronically Signed   By: Davina Poke D.O.   On: 09/02/2021 16:01 MR SHOULDER RIGHT WO CONTRAST CLINICAL DATA:  Right shoulder pain with decreased range of motion  EXAM: MRI OF THE RIGHT SHOULDER WITHOUT CONTRAST  TECHNIQUE: Multiplanar, multisequence MR imaging of the shoulder was performed. No intravenous contrast was administered.  COMPARISON:  X-ray 03/29/2021  FINDINGS: Rotator cuff: Complete full-thickness tears of the supraspinatus and subscapularis tendons with retraction to the  level of the glenohumeral joint. Severe infraspinatus tendinosis. Intact teres minor.  Muscles:  Supraspinatus and subscapularis  muscle atrophy.  Biceps long head: Not visualized, likely torn and retracted. Fluid and debris within the tendon sheath.  Acromioclavicular Joint: Severe degenerative changes of the AC joint. AC joint effusion. Moderate volume subacromial-subdeltoid bursal fluid communicating with the glenohumeral joint.  Glenohumeral Joint: Small glenohumeral joint effusion. Humeral head is anteriorly and superiorly subluxed relative to the glenoid without dislocation. Mild diffuse chondral thinning.  Labrum:  Superior labrum appears degenerated and torn.  Bones: No acute fracture. No dislocation. No bone marrow edema. No marrow replacing bone lesion.  Other: None.  IMPRESSION: Right shoulder:  1. Complete full-thickness retracted tears of the supraspinatus and subscapularis tendons with associated muscle atrophy. 2. Severe infraspinatus tendinosis. 3. Nonvisualization of the long head of the biceps tendon, likely torn and retracted. 4. Severe acromioclavicular and mild glenohumeral osteoarthritis.  Electronically Signed   By: Davina Poke D.O.   On: 09/02/2021 15:58  Assessment  The primary encounter diagnosis was Chronic radicular lumbar pain. A diagnosis of Lumbar radiculopathy was also pertinent to this visit.  Plan of Care   1. Chronic radicular lumbar pain - Lumbar Epidural Injection; Future  2. Lumbar radiculopathy - Lumbar Epidural Injection; Future    Orders:  Orders Placed This Encounter  Procedures   Lumbar Epidural Injection    Standing Status:   Future    Standing Expiration Date:   12/07/2021    Scheduling Instructions:     Procedure: Interlaminar Lumbar Epidural Steroid injection (LESI)            Laterality: Left L3/L4 ESI     Sedation: Patient's choice.     Timeframe: ASAA     Stop Eliquis 3 days prior.    Order Specific  Question:   Where will this procedure be performed?    Answer:   [facility]   Follow-up plan:   Return in about 2 weeks (around 09/20/2021) for Left L3/4 ESI , in clinic NS (stop Xarelto 3 days prior, reduce ASA 325 to 81 7 days prior).     s/p lesi #1 (left L4/5) on 8/17, #2 on 10/21/2018 (left L3/4), #3 on 12/09/2018 (Left L3/4); 02/24/2019: Right wrist injection and left trigger finger (middle finger) injection.,  Bilateral wrist injection 10/15/2019, bilateral knee Hyalgan No. 1 01/26/1960. #2 02/23/2020, #3 03/24/2020.  Endorsing approximately 75 to 80% pain relief for bilateral knee pain.  Lumbar L3-5 RFA, Left: 02/16/21, Right 03/09/21: 90% pain relief            Recent Visits Date Type Provider Dept  08/23/21 Office Visit Gillis Santa, MD [facility]  07/28/21 Office Visit Gillis Santa, MD [facility]  06/27/21 Procedure visit Gillis Santa, MD [facility]  06/16/21 Office Visit Gillis Santa, MD [facility]  Showing recent visits within past 90 days and meeting all other requirements Today's Visits Date Type Provider Dept  09/06/21 Office Visit Gillis Santa, MD [facility]  Showing today's visits and meeting all other requirements Future Appointments Date Type Provider Dept  11/15/21 Appointment Gillis Santa, MD [facility]  Showing future appointments within next 90 days and meeting all other requirements  I discussed the assessment and treatment plan with the patient. The patient was provided an opportunity to ask questions and all were answered. The patient agreed with the plan and demonstrated an understanding of the instructions.  Patient advised to call back or seek an in-person evaluation if the symptoms or condition worsens.  Duration of encounter: 46mnutes.  Note  by: Gillis Santa, MD Date: 09/06/2021; Time: 3:40 PM

## 2021-09-08 ENCOUNTER — Ambulatory Visit
Admission: RE | Admit: 2021-09-08 | Discharge: 2021-09-08 | Disposition: A | Discharge status: Home / Self Care | Payer: Medicare Other | Source: Ambulatory Visit | Attending: Orthopedic Surgery | Admitting: Orthopedic Surgery

## 2021-09-08 DIAGNOSIS — M19011 Primary osteoarthritis, right shoulder: Secondary | ICD-10-CM | POA: Diagnosis not present

## 2021-09-08 DIAGNOSIS — M75121 Complete rotator cuff tear or rupture of right shoulder, not specified as traumatic: Secondary | ICD-10-CM | POA: Diagnosis not present

## 2021-09-08 DIAGNOSIS — M25411 Effusion, right shoulder: Secondary | ICD-10-CM | POA: Diagnosis not present

## 2021-09-14 ENCOUNTER — Other Ambulatory Visit: Payer: Self-pay | Admitting: Orthopedic Surgery

## 2021-09-14 DIAGNOSIS — M75112 Incomplete rotator cuff tear or rupture of left shoulder, not specified as traumatic: Secondary | ICD-10-CM | POA: Diagnosis not present

## 2021-09-16 ENCOUNTER — Telehealth: Payer: Self-pay | Admitting: *Deleted

## 2021-09-16 ENCOUNTER — Telehealth: Payer: Self-pay | Admitting: Student in an Organized Health Care Education/Training Program

## 2021-09-16 ENCOUNTER — Telehealth: Payer: Self-pay | Admitting: Cardiovascular Disease

## 2021-09-16 NOTE — Telephone Encounter (Signed)
Patient with diagnosis of afib on Xarelto for anticoagulation.    Procedure: R reverse shoulder arthroplasty, biceps tenodesis Date of procedure: 09/27/21   CHA2DS2-VASc Score = 4   This indicates a 4.8% annual risk of stroke. The patient's score is based upon: CHF History: 1 HTN History: 1 Diabetes History: 0 Stroke History: 0 Vascular Disease History: 1 Age Score: 1 Gender Score: 0      CrCl 86 ml/min  Per office protocol, patient can hold Xarelto for 3 days prior to procedure.    **This guidance is not considered finalized until pre-operative APP has relayed final recommendations.**

## 2021-09-16 NOTE — Telephone Encounter (Signed)
Please set up virtual telephone visit for cardiac clearance

## 2021-09-16 NOTE — Telephone Encounter (Signed)
Pt agreeable to plan of care for tele pre op appt 09/22/21 @ 3:20. Med rec and consent are done.     Patient Consent for Virtual Visit        [name] has provided verbal consent on 09/16/2021 for a virtual visit (video or telephone).   CONSENT FOR VIRTUAL VISIT FOR:  [name]  By participating in this virtual visit I agree to the following:  I hereby voluntarily request, consent and authorize [facility] and its employed or contracted physicians, physician assistants, nurse practitioners or other licensed health care professionals (the Practitioner), to provide me with telemedicine health care services (the "Services") as deemed necessary by the treating Practitioner. I acknowledge and consent to receive the Services by the Practitioner via telemedicine. I understand that the telemedicine visit will involve communicating with the Practitioner through live audiovisual communication technology and the disclosure of certain medical information by electronic transmission. I acknowledge that I have been given the opportunity to request an in-person assessment or other available alternative prior to the telemedicine visit and am voluntarily participating in the telemedicine visit.  I understand that I have the right to withhold or withdraw my consent to the use of telemedicine in the course of my care at any time, without affecting my right to future care or treatment, and that the Practitioner or I may terminate the telemedicine visit at any time. I understand that I have the right to inspect all information obtained and/or recorded in the course of the telemedicine visit and may receive copies of available information for a reasonable fee.  I understand that some of the potential risks of receiving the Services via telemedicine include:  Delay or interruption in medical evaluation due to technological equipment failure or disruption; Information transmitted may not be sufficient (e.g.  poor resolution of images) to allow for appropriate medical decision making by the Practitioner; and/or  In rare instances, security protocols could fail, causing a breach of personal health information.  Furthermore, I acknowledge that it is my responsibility to provide information about my medical history, conditions and care that is complete and accurate to the best of my ability. I acknowledge that Practitioner's advice, recommendations, and/or decision may be based on factors not within their control, such as incomplete or inaccurate data provided by me or distortions of diagnostic images or specimens that may result from electronic transmissions. I understand that the practice of medicine is not an exact science and that Practitioner makes no warranties or guarantees regarding treatment outcomes. I acknowledge that a copy of this consent can be made available to me via my patient portal ([facility]), or I can request a printed copy by calling the office of [facility].    I understand that my insurance will be billed for this visit.   I have read or had this consent read to me. I understand the contents of this consent, which adequately explains the benefits and risks of the Services being provided via telemedicine.  I have been provided ample opportunity to ask questions regarding this consent and the Services and have had my questions answered to my satisfaction. I give my informed consent for the services to be provided through the use of telemedicine in my medical care

## 2021-09-16 NOTE — Telephone Encounter (Signed)
Noted  

## 2021-09-16 NOTE — Telephone Encounter (Signed)
Pt agreeable to plan of care for tele pre op appt 09/22/21 @ 3:20. Med rec and consent are done.

## 2021-09-16 NOTE — Telephone Encounter (Signed)
Clinical pharmacist to review Xarelto 

## 2021-09-16 NOTE — Telephone Encounter (Signed)
   Pre-operative Risk Assessment    Patient Name: [name]  DOB: 25-Feb-1955 MRN: [id number]      Request for Surgical Clearance    Procedure:   R reverse shoulder arthroplasty, biceps tenodesis   Date of Surgery:  Clearance 09/27/21                                 Surgeon:  Dr Leim Fabry Surgeon's Group or Practice Name:  [facility] Phone number:  [phone] Fax number:  [phone]   Type of Clearance Requested:   - Medical    Type of Anesthesia:  Not Indicated   Additional requests/questions:    SignedAce Gins   09/16/2021, 10:32 AM

## 2021-09-16 NOTE — Telephone Encounter (Signed)
having surgery on 09-27-21, surgeon suggested he not have injection the day before surgery. patient will call back to reschedule

## 2021-09-20 ENCOUNTER — Other Ambulatory Visit: Payer: Self-pay | Admitting: Family Medicine

## 2021-09-20 DIAGNOSIS — D510 Vitamin B12 deficiency anemia due to intrinsic factor deficiency: Secondary | ICD-10-CM

## 2021-09-21 ENCOUNTER — Encounter: Payer: Self-pay | Admitting: Orthopedic Surgery

## 2021-09-21 ENCOUNTER — Encounter
Admission: RE | Admit: 2021-09-21 | Discharge: 2021-09-21 | Disposition: A | Discharge status: Home / Self Care | Payer: Medicare Other | Source: Ambulatory Visit | Attending: Orthopedic Surgery | Admitting: Orthopedic Surgery

## 2021-09-21 VITALS — BP 122/71 | HR 65 | Resp 14 | Ht 72.0 in | Wt 320.1 lb

## 2021-09-21 DIAGNOSIS — Z01812 Encounter for preprocedural laboratory examination: Secondary | ICD-10-CM | POA: Insufficient documentation

## 2021-09-21 DIAGNOSIS — E875 Hyperkalemia: Secondary | ICD-10-CM | POA: Diagnosis not present

## 2021-09-21 DIAGNOSIS — Z01818 Encounter for other preprocedural examination: Secondary | ICD-10-CM

## 2021-09-21 DIAGNOSIS — Z79899 Other long term (current) drug therapy: Secondary | ICD-10-CM

## 2021-09-21 HISTORY — DX: Long term (current) use of antithrombotics/antiplatelets: Z79.02

## 2021-09-21 HISTORY — DX: Mixed hyperlipidemia: E78.2

## 2021-09-21 HISTORY — DX: Chronic pain syndrome: G89.4

## 2021-09-21 HISTORY — DX: Other hemorrhoids: K64.8

## 2021-09-21 HISTORY — DX: Hyperlipidemia, unspecified: E78.5

## 2021-09-21 HISTORY — DX: Polyp of colon: K63.5

## 2021-09-21 HISTORY — DX: Personal history of other diseases of the digestive system: Z87.19

## 2021-09-21 HISTORY — DX: Adverse effect of antihyperlipidemic and antiarteriosclerotic drugs, initial encounter: T46.6X5A

## 2021-09-21 HISTORY — DX: Diverticulosis of intestine, part unspecified, without perforation or abscess without bleeding: K57.90

## 2021-09-21 HISTORY — DX: Myalgia, unspecified site: M79.10

## 2021-09-21 HISTORY — DX: Unspecified osteoarthritis, unspecified site: M19.90

## 2021-09-21 HISTORY — DX: Other long term (current) drug therapy: Z79.899

## 2021-09-21 HISTORY — DX: Atherosclerosis of aorta: I70.0

## 2021-09-21 HISTORY — DX: Benign neoplasm of colon, unspecified: D12.6

## 2021-09-21 LAB — URINALYSIS, ROUTINE W REFLEX MICROSCOPIC
Bilirubin Urine: NEGATIVE
Glucose, UA: NEGATIVE mg/dL
Hgb urine dipstick: NEGATIVE
Ketones, ur: NEGATIVE mg/dL
Leukocytes,Ua: NEGATIVE
Nitrite: NEGATIVE
Protein, ur: 30 mg/dL — AB
Specific Gravity, Urine: 1.026 (ref 1.005–1.030)
Squamous Epithelial / HPF: NONE SEEN (ref 0–5)
pH: 5 (ref 5.0–8.0)

## 2021-09-21 LAB — SURGICAL PCR SCREEN
MRSA, PCR: NEGATIVE
Staphylococcus aureus: NEGATIVE

## 2021-09-21 LAB — TYPE AND SCREEN
ABO/RH(D): O NEG
Antibody Screen: NEGATIVE

## 2021-09-21 LAB — POTASSIUM: Potassium: 4.6 mmol/L (ref 3.5–5.1)

## 2021-09-21 NOTE — Telephone Encounter (Signed)
Requested medication (s) are due for refill today: For review  Requested medication (s) are on the active medication list: yes    Last refill: 09/16/20 1 ml PRN refills  Future visit scheduled no  Notes to clinic: Refills are PRN  Please review. Thank you.  Requested Prescriptions  Pending Prescriptions Disp Refills   cyanocobalamin (VITAMIN B12) 1000 MCG/ML injection [Pharmacy Med Name: CYANOCOBALAMIN 1,000 MCG/ML VL] 1 mL PRN    Sig: INJECT 1ML INTO SKIN EVERY 30 DAYS AS DIRECTED     Endocrinology:  Vitamins - Vitamin B12 Failed - 09/20/2021  2:19 AM      Failed - HGB in normal range and within 360 days    Hemoglobin  Date Value Ref Range Status  07/27/2021 12.6 (L) 13.2 - 17.1 g/dL Final  08/01/2019 13.4 13.0 - 17.7 g/dL Final         Passed - HCT in normal range and within 360 days    HCT  Date Value Ref Range Status  07/27/2021 40.5 38.5 - 50.0 % Final   Hematocrit  Date Value Ref Range Status  08/01/2019 42.9 37.5 - 51.0 % Final         Passed - B12 Level in normal range and within 360 days    Vitamin B-12  Date Value Ref Range Status  02/01/2021 509 232 - 1,245 pg/mL Final         Passed - Valid encounter within last 12 months    Recent Outpatient Visits           1 month ago Annual physical exam   [place], DO   4 months ago Lumbar degenerative disc disease   [facility] Olin Hauser, DO   4 months ago [place], DO   6 months ago Primary insomnia   [place], DO   7 months ago Mixed hyperlipidemia   [place], DO       Future Appointments             In 2 months [facility], Devonne Doughty, [facility], [facility]

## 2021-09-21 NOTE — Patient Instructions (Addendum)
Your procedure is scheduled on:09-27-21 Tuesday Report to the Registration Desk on the 1st floor of the [facility].Then proceed to the 2nd floor Surgery Desk To find out your arrival time, please call [phone] between 1PM - 3PM on:09-26-21 Monday If your arrival time is 6:00 am, do not arrive prior to that time as the [facility] entrance doors do not open until 6:00 am.  REMEMBER: Instructions that are not followed completely may result in serious medical risk, up to and including death; or upon the discretion of your surgeon and anesthesiologist your surgery may need to be rescheduled.  Do not eat food after midnight the night before surgery.  No gum chewing, lozengers or hard candies.  You may however, drink CLEAR liquids up to 2 hours before you are scheduled to arrive for your surgery. Do not drink anything within 2 hours of your scheduled arrival time.  Clear liquids include: - water  - apple juice without pulp - gatorade (not RED colors) - black coffee or tea (Do NOT add milk or creamers to the coffee or tea) Do NOT drink anything that is not on this list.  In addition, your doctor has ordered for you to drink the provided  Ensure Pre-Surgery Clear Carbohydrate Drink  Drinking this carbohydrate drink up to two hours before surgery helps to reduce insulin resistance and improve patient outcomes. Please complete drinking 2 hours prior to scheduled arrival time.  TAKE THESE MEDICATIONS THE MORNING OF SURGERY WITH A SIP OF WATER: -amiodarone (PACERONE) -DULoxetine (CYMBALTA)  -busPIRone (BUSPAR)   Last dose of Xarelto was on 09-16-21 and last dose of Aspirin 325 was today on 09-21-21   One week prior to surgery: Stop Anti-inflammatories (NSAIDS) such as Advil, Aleve, Ibuprofen, Motrin, Naproxen, Naprosyn and Aspirin based products such as Excedrin, Goodys Powder, BC Powder.You may however, take Tylenol/Hydrocodone if needed for pain up until the day of surgery.  Stop ANY OVER  THE COUNTER supplements/vitamins NOW (09-21-21) until after surgery (Multiple Vitamin) You may continue your Melatonin up until the night prior to surgery  No Alcohol for 24 hours before or after surgery.  No Smoking including e-cigarettes for 24 hours prior to surgery.  No chewable tobacco products for at least 6 hours prior to surgery.  No nicotine patches on the day of surgery.  Do not use any "recreational" drugs for at least a week prior to your surgery.  Please be advised that the combination of cocaine and anesthesia may have negative outcomes, up to and including death. If you test positive for cocaine, your surgery will be cancelled.  On the morning of surgery brush your teeth with toothpaste and water, you may rinse your mouth with mouthwash if you wish. Do not swallow any toothpaste or mouthwash.  Use CHG Soap as directed on instruction sheet.  Total Shoulder Arthroplasty:  use Benzolyl Peroxide 5% Gel as directed on instruction sheet.  Do not wear jewelry, make-up, hairpins, clips or nail polish.  Do not wear lotions, powders, or perfumes.   Do not shave body from the neck down 48 hours prior to surgery just in case you cut yourself which could leave a site for infection.  Also, freshly shaved skin may become irritated if using the CHG soap.  Contact lenses, hearing aids and dentures may not be worn into surgery.  Do not bring valuables to the hospital. [facility] is not responsible for any missing/lost belongings or valuables.   Notify your doctor if there is any  change in your medical condition (cold, fever, infection).  Wear comfortable clothing (specific to your surgery type) to the hospital.  After surgery, you can help prevent lung complications by doing breathing exercises.  Take deep breaths and cough every 1-2 hours. Your doctor may order a device called an Incentive Spirometer to help you take deep breaths. When coughing or sneezing, hold a pillow firmly  against your incision with both hands. This is called "splinting." Doing this helps protect your incision. It also decreases belly discomfort.  If you are being admitted to the hospital overnight, leave your suitcase in the car. After surgery it may be brought to your room.  If you are being discharged the day of surgery, you will not be allowed to drive home. You will need a responsible adult (18 years or older) to drive you home and stay with you that night.   If you are taking public transportation, you will need to have a responsible adult (18 years or older) with you. Please confirm with your physician that it is acceptable to use public transportation.   Please call the [name] at [phone] if you have any questions about these instructions.  Surgery Visitation Policy:  Patients undergoing a surgery or procedure may have two family members or support persons with them as long as the person is not COVID-19 positive or experiencing its symptoms.   Inpatient Visitation:    Visiting hours are 7 a.m. to 8 p.m. Up to four visitors are allowed at one time in a patient room, including children. The visitors may rotate out with other people during the day. One designated support person (adult) may remain overnight.    How to Use an Incentive Spirometer An incentive spirometer is a tool that measures how well you are filling your lungs with each breath. Learning to take long, deep breaths using this tool can help you keep your lungs clear and active. This may help to reverse or lessen your chance of developing breathing (pulmonary) problems, especially infection. You may be asked to use a spirometer: After a surgery. If you have a lung problem or a history of smoking. After a long period of time when you have been unable to move or be active. If the spirometer includes an indicator to show the highest number that you have reached, your health care provider or  respiratory therapist will help you set a goal. Keep a log of your progress as told by your health care provider. What are the risks? Breathing too quickly may cause dizziness or cause you to pass out. Take your time so you do not get dizzy or light-headed. If you are in pain, you may need to take pain medicine before doing incentive spirometry. It is harder to take a deep breath if you are having pain. How to use your incentive spirometer  Sit up on the edge of your bed or on a chair. Hold the incentive spirometer so that it is in an upright position. Before you use the spirometer, breathe out normally. Place the mouthpiece in your mouth. Make sure your lips are closed tightly around it. Breathe in slowly and as deeply as you can through your mouth, causing the piston or the ball to rise toward the top of the chamber. Hold your breath for 3-5 seconds, or for as long as possible. If the spirometer includes a coach indicator, use this to guide you in breathing. Slow down your breathing if the indicator goes above  the marked areas. Remove the mouthpiece from your mouth and breathe out normally. The piston or ball will return to the bottom of the chamber. Rest for a few seconds, then repeat the steps 10 or more times. Take your time and take a few normal breaths between deep breaths so that you do not get dizzy or light-headed. Do this every 1-2 hours when you are awake. If the spirometer includes a goal marker to show the highest number you have reached (best effort), use this as a goal to work toward during each repetition. After each set of 10 deep breaths, cough a few times. This will help to make sure that your lungs are clear. If you have an incision on your chest or abdomen from surgery, place a pillow or a rolled-up towel firmly against the incision when you cough. This can help to reduce pain while taking deep breaths and coughing. General tips When you are able to get out of bed: Walk  around often. Continue to take deep breaths and cough in order to clear your lungs. Keep using the incentive spirometer until your health care provider says it is okay to stop using it. If you have been in the hospital, you may be told to keep using the spirometer at home. Contact a health care provider if: You are having difficulty using the spirometer. You have trouble using the spirometer as often as instructed. Your pain medicine is not giving enough relief for you to use the spirometer as told. You have a fever. Get help right away if: You develop shortness of breath. You develop a cough with bloody mucus from the lungs. You have fluid or blood coming from an incision site after you cough. Summary An incentive spirometer is a tool that can help you learn to take long, deep breaths to keep your lungs clear and active. You may be asked to use a spirometer after a surgery, if you have a lung problem or a history of smoking, or if you have been inactive for a long period of time. Use your incentive spirometer as instructed every 1-2 hours while you are awake. If you have an incision on your chest or abdomen, place a pillow or a rolled-up towel firmly against your incision when you cough. This will help to reduce pain. Get help right away if you have shortness of breath, you cough up bloody mucus, or blood comes from your incision when you cough. This information is not intended to replace advice given to you by your health care provider. Make sure you discuss any questions you have with your health care provider. Document Revised: 03/24/2019 Document Reviewed: 03/24/2019 Elsevier Patient Education  [place].

## 2021-09-21 NOTE — Progress Notes (Signed)
Perioperative Services  Pre-Admission/Anesthesia Testing Clinical Review  Date: 09/22/21  Patient Demographics:  Name: [name] DOB:   06-04-1955 MRN:   [id number]  Planned Surgical Procedure(s):    Case: [id number] Date/Time: 09/27/21 0700   Procedures:  Right reverse shoulder arthroplasty, biceps tenodesis (Right: Shoulder)   Anesthesia type: Choice   Pre-op diagnosis: Incomplete tear of right rotator cuff, unspecified whether traumatic  M75.112   Location: [facility] OR ROOM 01 / [place] [facility] FOR ANESTHESIA GROUP   Surgeons: Leim Fabry, MD   NOTE: Available PAT nursing documentation and vital signs have been reviewed. Clinical nursing staff has updated patient's PMH/PSHx, current medication list, and drug allergies/intolerances to ensure comprehensive history available to assist in medical decision making as it pertains to the aforementioned surgical procedure and anticipated anesthetic course. Extensive review of available clinical information performed. [facility] PMH and PSHx updated with any diagnoses/procedures that  may have been inadvertently omitted during his intake with the pre-admission testing department's nursing staff.  Clinical Discussion:  [name] is a 66 y.o. male who is submitted for pre-surgical anesthesia review and clearance prior to him undergoing the above procedure. Patient is a Former Smoker (10 pack years; quit 02/1979). Pertinent PMH includes: CAD, cardiomyopathy, PAF, aortic atherosclerosis, SVT, HTN, HLD, hiatal hernia, pernicious anemia, prostate cancer, OA, DJD, lumbosacral DDD, chronic pain syndrome, chronic opioid use, sleep difficulties.  Patient is followed by cardiology Wayne Situ, MD). He was last seen in the cardiology clinic on 03/14/2021; notes reviewed. At the time of this clinic visit, patient doing well overall from a cardiovascular perspective.  He denied any episodes of chest pain, short of breath, PND, orthopnea, significant peripheral edema,  vertiginous symptoms, or presyncope/syncope.  Patient with intermittent episodes of palpitations and elevated blood pressure secondary to situational stress.  Patient with a past medical history significant for cardiovascular diagnoses.   Patient with a history of cardiomyopathy in the setting of known atrial fibrillation.  TTE performed on 12/26/2013 revealed a mildly reduced left ventricular systolic function with an EF of 45-50%.  There was mild anterior and anteroseptal hypokinesis.  Left atrium was mildly dilated.  Right ventricular systolic function normal.  There was mild mitral valve regurgitation.  There was no evidence of a significant transvalvular gradient to suggest stenosis.  Patient underwent a DCCV procedure on 03/13/2014, at which time he received a single 200 J biphasic cardioversion restoring NSR.  Cardiac CTA performed on 11/06/2014 revealed an elevated coronary calcium score of 224.  All coronary calcium noted to be in the proximal LAD distribution.  Calcium score in place patient in the 74th percentile for age and sex matched control.  Patient developed refractory atrial fibrillation.  He underwent a repeat DCCV procedure on 05/15/2019.  With this procedure, patient received multiple cardioversion attempts (150 J x 1 and 200 J x 2) prior to converting to NSR.  Most recent TTE was performed on 07/04/2019 revealing a normal left ventricular systolic function with an EF of 55%.  There was mid apical anteroseptal hypokinesis.  Diastolic Doppler parameters consistent with pseudonormalization (G2DD). Mild mitral annular calcification and aortic valve sclerosis noted.  There was no evidence of a significant transvalvular gradient to suggest stenosis.  RVSP mildly elevated at 25.3 mmHg.  Patient with an atrial fibrillation diagnosis; CHA2DS2-VASc Score = 4 (age, HTN, CHF, vascular disease history). His rate and rhythm are currently being maintained on oral amiodarone + metoprolol  succinate. He is chronically anticoagulated using rivaroxaban; reported to be compliant  with therapy with no evidence or reports of GI bleeding.  Blood pressure mildly elevated at 140/70 mmHg on currently prescribed CCB (amlodipine), ACEi (benazepril), diuretic (furosemide), and beta-blocker (metoprolol succinate) therapies.  Patient formally on rosuvastatin + ezetimibe for his HLD diagnosis and ASCVD prevention.  Patient self discontinued these medications secondary to associated myalgias.  HLD diagnosis currently being managed with diet lifestyle modification alone. He is not diabetic.  Patient does not have an OSAH diagnosis. Functional capacity, as defined by DASI, is documented as being >/= 4 METS.  MD recommended that patient restart ezetimibe.  Additionally, patient also advised to restart amlodipine 5 mg in the evening.  Of note, this medication was previously failed due to orthostasis, however now blood pressure noted be slightly elevated.  No other changes were made to his medication regimen.  Patient to follow-up with outpatient cardiology in 1 year or sooner if needed.  [name] is scheduled for an elective RIGHT REVERSE SHOULDER ARTHROPLASTY; BICEPS TENODESIS on 09/27/2021 with Dr. Leim Fabry, MD. Given patient's past medical history significant for cardiovascular diagnoses, presurgical cardiac clearance was sought by the PAT team. Per cardiology, "Mr. Lederman's perioperative risk of a major cardiac event is 0.9% according to the RCRI, therefore, he is at low risk for perioperative complications. His functional capacity is good at 6.55 METs according to the DASI. According to ACC/AHA guidelines, no further cardiovascular testing needed.  The patient may proceed to surgery at LOW/ACCEPTABLE risk".  Again, this patient is on both anticoagulation and antiplatelet therapies.  Patient has been instructed on recommendations from his cardiologist for holding his rivaroxaban dose for 3 days prior to his  procedure with plans to restart medications as soon as postoperatively respectively minimized by primary attending surgeon. Of note, at time of PAT call, patient advised that he had exhausted his supply of rivaroxaban (last dose 09/16/2021) and was going to restart after surgery. Patient was planning on stopping his ASA dose as of 09/21/2021 per direction of his surgeon.   Patient denies previous perioperative complications with anesthesia in the past. In review of the available records, it is noted that patient underwent a general anesthetic course here at [facility] in 04/2019 without documented complications.      09/21/2021   10:42 AM 08/23/2021    1:07 PM 08/03/2021   10:30 AM  Vitals with BMI  Height _0  _1  _2   Weight 320 lbs 2 oz 322 lbs 322 lbs 10 oz  BMI 06.0 52 04.59  Systolic 977 414 239  Diastolic 71 88 67  Pulse 65 56 51    Providers/Specialists:   NOTE: Primary physician provider listed below. Patient may have been seen by APP or partner within same practice.   PROVIDER ROLE / SPECIALTY LAST Rossie Muskrat, MD Orthopedics (Surgeon) 09/14/2021  Olin Hauser, DO Primary Care Provider 08/03/2021  Ida Rogue, MD Cardiology 03/14/2021   Gillis Santa, MD Pain Management 09/06/2021   Allergies:  Patient has no known allergies.  Current Home Medications:   No current facility-administered medications for this encounter.    amiodarone (PACERONE) 200 MG tablet   amLODipine (NORVASC) 5 MG tablet   Aspirin 325 MG CAPS   benazepril (LOTENSIN) 40 MG tablet   busPIRone (BUSPAR) 10 MG tablet   carboxymethylcellulose (REFRESH PLUS) 0.5 % SOLN   cyanocobalamin (VITAMIN B12) 1000 MCG/ML injection   cyclobenzaprine (FLEXERIL) 10 MG tablet   DULoxetine (CYMBALTA) 30 MG  capsule   furosemide (LASIX) 20 MG tablet   HYDROcodone-acetaminophen (NORCO) 7.5-325 MG tablet   [START ON 10/22/2021] HYDROcodone-acetaminophen  (NORCO) 7.5-325 MG tablet   ibuprofen (ADVIL) 200 MG tablet   melatonin 3 MG TABS tablet   metoprolol succinate (TOPROL-XL) 50 MG 24 hr tablet   Multiple Vitamin (MULTIVITAMIN WITH MINERALS) TABS tablet   rivaroxaban (XARELTO) 20 MG TABS tablet   traZODone (DESYREL) 150 MG tablet   History:   Past Medical History:  Diagnosis Date   Aortic atherosclerosis ([place])    Cardiomyopathy (in setting of Afib)    a.) TTE 12/26/2013: EF 45-50%, mild ant and antsept HK. mild MR. Mod dil LA. nl RV fxn. Rhythm was Afib; b.) TTE 07/04/2019: EF 55%, mid-apical anteroseptal HK, mild MAC, mild Ao sclerosis, G2DD, RVSP 45.3   Chronic pain syndrome    a.) followed by pain management   Chronic, continuous use of opioids    a.) hydrocodone/APAP 7.5/325 mg; followed by pain management   Coronary artery disease 11/06/2014   a.) cCTA 11/06/2014: Ca score 224 (all in pLAD) -- 74th percentile for age/sex matched control   Diverticulosis    DJD (degenerative joint disease) of knee    History of hiatal hernia    History of kidney stones 2012   Hyperlipidemia    Hyperplastic colon polyp    Hypertension    Internal hemorrhoids    Intervertebral disc disorder with radiculopathy of lumbosacral region    Long term current use FULL DOSE (325 mg) aspirin    Long term current use of amiodarone    Long term current use of antithrombotics/antiplatelets    a.) rivaroxaban   Lower extremity edema    Mixed hyperlipidemia    Myalgia due to statin    Osteoarthritis    PAF (paroxysmal atrial fibrillation) ([place])    a.) CHA2DS2VASc = 4 (age, HTN, CHF, vascular disease history);  b.) s/p DCCV 03/13/2014 (200 J x1); c.) s/p DCCV 05/15/2019 (150 J x 1, 200 J x2); d.) rate/rhythm maintained on oral amiodarone + metoprolol succinate; chronically anticoagulated with rivaroxaban   Pernicious anemia    Prostate cancer ([place])    Pulmonary nodule, right    a. 10/2014 Cardiac CTA: 80m RLL nodule; b. 04/2015 CT Chest: stable 740mRLL  nodule. No new nodules; 02/2017 CTA Chest: stable, benign, 27m19mLL pulm nodule.   Sleep difficulties    a.) takes melatonin PRN   SVT (supraventricular tachycardia) ([facility])    Tubular adenoma of colon    Past Surgical History:  Procedure Laterality Date   CARDIOVERSION N/A 05/15/2019   Procedure: CARDIOVERSION;  Surgeon: GolMinna MerrittsD;  Location: [facility] ORS;  Service: Cardiovascular;  Laterality: N/A;   CARDIOVERSION N/A 03/13/2014   Procedure: CARDIOVERSION; [facility] TimIda [facility]ueD   CARPAL TUNNEL RELEASE Right 12/23/2014   Procedure: CARPAL TUNNEL RELEASE;  Surgeon: HowEarnestine LeysD;  Location: [facility] ORS;  Service: Orthopedics;  Laterality: Right;   CARPAL TUNNEL RELEASE Left 01/06/2015   Procedure: CARPAL TUNNEL RELEASE;  Surgeon: HowEarnestine LeysD;  Location: [facility] ORS;  Service: Orthopedics;  Laterality: Left;   COLONOSCOPY WITH PROPOFOL N/A 10/08/2017   Procedure: COLONOSCOPY WITH PROPOFOL;  Surgeon: VanLin LandsmanD;  [facility];  Service: Gastroenterology;  Laterality: N/A;   GASTRIC BYPASS  01/17/2000   KNEE SURGERY Right    knee trauma x3   prostate seeding     Family History  Problem Relation Age of Onset   Heart  disease Father    Hypertension Father    Stroke Father    Cancer Father    Arrhythmia Sister        A-fib   Arrhythmia Brother        A-fib   Social History   Tobacco Use   Smoking status: Former    Packs/day: 1.00    Years: 10.00    Total pack years: 10.00    Types: Cigarettes    Quit date: 02/18/1979    Years since quitting: 42.6   Smokeless tobacco: Former    Types: Snuff  Vaping Use   Vaping Use: Never used  Substance Use Topics   Alcohol use: Not Currently    Comment: last drink in 2021   Drug use: No    Pertinent Clinical Results:  LABS: Labs reviewed: Acceptable for surgery.   Lab Results  Component Value Date   WBC 7.2 07/27/2021   HGB 12.6 (L) 07/27/2021   HCT 40.5 07/27/2021   MCV  79.7 (L) 07/27/2021   PLT 244 07/27/2021   Lab Results  Component Value Date   NA 142 07/27/2021   K 4.6 09/21/2021   CO2 27 07/27/2021   GLUCOSE 98 07/27/2021   BUN 15 07/27/2021   CREATININE 1.13 07/27/2021   CALCIUM 9.3 07/27/2021   GFRNONAA 62 07/21/2020   Hospital Outpatient Visit on 09/21/2021  Component Date Value Ref Range Status   MRSA, PCR 09/21/2021 NEGATIVE  NEGATIVE Final   Staphylococcus aureus 09/21/2021 NEGATIVE  NEGATIVE Final   Comment: (NOTE) The Xpert SA Assay (FDA approved for NASAL specimens in patients 36 years of age and older), is one component of a comprehensive surveillance program. It is not intended to diagnose infection nor to guide or monitor treatment. Performed at [facility] [place], [place], [place] [address]    Color, Urine 09/21/2021 AMBER (A)  YELLOW Final   BIOCHEMICALS MAY BE AFFECTED BY COLOR   APPearance 09/21/2021 HAZY (A)  CLEAR Final   Specific Gravity, Urine 09/21/2021 1.026  1.005 - 1.030 Final   pH 09/21/2021 5.0  5.0 - 8.0 Final   Glucose, UA 09/21/2021 NEGATIVE  NEGATIVE mg/dL Final   Hgb urine dipstick 09/21/2021 NEGATIVE  NEGATIVE Final   Bilirubin Urine 09/21/2021 NEGATIVE  NEGATIVE Final   Ketones, ur 09/21/2021 NEGATIVE  NEGATIVE mg/dL Final   Protein, ur 09/21/2021 30 (A)  NEGATIVE mg/dL Final   Nitrite 09/21/2021 NEGATIVE  NEGATIVE Final   Leukocytes,Ua 09/21/2021 NEGATIVE  NEGATIVE Final   RBC / HPF 09/21/2021 0-5  0 - 5 RBC/hpf Final   WBC, UA 09/21/2021 0-5  0 - 5 WBC/hpf Final   Bacteria, UA 09/21/2021 RARE (A)  NONE SEEN Final   Squamous Epithelial / LPF 09/21/2021 NONE SEEN  0 - 5 Final   Mucus 09/21/2021 PRESENT   Final   Ca Oxalate Crys, UA 09/21/2021 PRESENT   Final   Performed at [facility], [address] [place], [place] [address]   ABO/RH(D) 09/21/2021 PENDING   Incomplete   Antibody Screen 09/21/2021 PENDING   Incomplete   Sample Expiration 09/21/2021 10/05/2021,2359    Final   Extend sample reason 09/21/2021    Final                   Value:NO TRANSFUSIONS OR PREGNANCY IN THE PAST 3 MONTHS Performed at [facility], [address] [place], [place] [address]    Potassium 09/21/2021 4.6  3.5 - 5.1  mmol/L Final   Performed at [facility], [address] [place], [place] [address]    ECG: Date: 03/14/2021 Time ECG obtained: 1044 AM Rate: 50 bpm Rhythm: sinus bradycardia Axis (leads I and aVF): Normal Intervals: PR 162 ms. QRS 94 ms. QTc 408 ms. ST segment and T wave changes: No evidence of acute ST segment elevation or depression Comparison: Similar to previous tracing obtained on 07/28/2020   IMAGING / PROCEDURES: CT SHOULDER RIGHT WO CONTRAST performed on 09/08/2021 Large full-thickness retracted tear of the supraspinatus tendon with narrowing of the humeroacromial space. Mild glenohumeral joint degenerative changes and moderate AC joint degenerative changes. Moderate joint effusion and expected fluid in the subacromial/subdeltoid bursa.  MR SHOULDER LEFT WO CONTRAST performed on 08/31/2021 Severe rotator cuff tendinosis. Low-grade insertional tearing of the supraspinatus-infraspinatus tendon interdigitation. No full-thickness or retracted rotator cuff tear. Intra-articular biceps tendinosis with moderate tenosynovitis. Moderate-severe acromioclavicular and mild glenohumeral osteoarthritis.  MR SHOULDER RIGHT WO CONTRAST performed on 08/31/2021 Complete full-thickness retracted tears of the supraspinatus and subscapularis tendons with associated muscle atrophy. Severe infraspinatus tendinosis. Nonvisualization of the long head of the biceps tendon, likely torn and retracted. Severe acromioclavicular and mild glenohumeral osteoarthritis.  MR LUMBAR SPINE W WO CONTRAST performed on 05/20/2021 Diffuse lumbar spine spondylosis as described above. No acute osseous injury of the lumbar spine. No aggressive osseous lesions to  suggest metastatic disease  TRANSTHORACIC ECHOCARDIOGRAM performed on 07/04/2019 Normal left ventricular systolic function with an EF of 55% Mild hypokinesis of the mid to apical anteroseptal wall Diastolic Doppler parameters consistent with pseudonormalization (G2DD). Moderate LAE Normal right ventricular systolic function Moderately elevated RVSP of 45.3 mmHg Mild MAC Mild aortic sclerosis No valvular regurgitation Normal transvalvular gradients; no valvular stenosis No pericardial effusion  CT CARDIAC SCORING performed on 11/06/2014 Coronary calcium score of 224 (all in the proximal LAD).   This was the 74th percentile for age and sex matched control  Impression and Plan:  [name] has been referred for pre-anesthesia review and clearance prior to him undergoing the planned anesthetic and procedural courses. Available labs, pertinent testing, and imaging results were personally reviewed by me. This patient has been appropriately cleared by cardiology with an overall LOW/ACCEPTABLE risk of significant perioperative cardiovascular complications.  Based on clinical review performed today (09/22/21), barring any significant acute changes in the patient's overall condition, it is anticipated that he will be able to proceed with the planned surgical intervention. Any acute changes in clinical condition may necessitate his procedure being postponed and/or cancelled. Patient will meet with anesthesia team (MD and/or CRNA) on the day of his procedure for preoperative evaluation/assessment. Questions regarding anesthetic course will be fielded at that time.   Pre-surgical instructions were reviewed with the patient during his PAT appointment and questions were fielded by PAT clinical staff. Patient was advised that if any questions or concerns arise prior to his procedure then he should return a call to PAT and/or his surgeon's office to discuss.  Honor Loh, MSN, APRN, FNP-C, CEN [facility]  Peri-operative Services Nurse Practitioner Phone: [phone] Fax: [phone] 09/22/21 8:51 AM  NOTE: This note has been prepared using Dragon dictation software. Despite my best ability to proofread, there is always the potential that unintentional transcriptional errors may still occur from this process.

## 2021-09-22 ENCOUNTER — Ambulatory Visit: Payer: Medicare Other | Attending: Interventional Cardiology | Admitting: Physician Assistant

## 2021-09-22 ENCOUNTER — Encounter: Payer: Self-pay | Admitting: Orthopedic Surgery

## 2021-09-22 DIAGNOSIS — Z01810 Encounter for preprocedural cardiovascular examination: Secondary | ICD-10-CM | POA: Diagnosis not present

## 2021-09-22 NOTE — Progress Notes (Addendum)
Virtual Visit via Telephone Note   Because of [name]'s co-morbid illnesses, he is at least at moderate risk for complications without adequate follow up.  This format is felt to be most appropriate for this patient at this time.  The patient did not have access to video technology/had technical difficulties with video requiring transitioning to audio format only (telephone).  All issues noted in this document were discussed and addressed.  No physical exam could be performed with this format.  Please refer to the patient's chart for his consent to telehealth for [name].  Evaluation Performed:  Preoperative cardiovascular risk assessment _____________   Date:  09/22/2021   Patient ID:  [name], DOB January 24, 1955, MRN [id number] Patient Location:  Home Provider location:   Office  Primary Care Provider:  Olin Hauser, DO Primary Cardiologist:  Ida Rogue, MD  Chief Complaint / Patient Profile   66 y.o. y/o male with a h/o prostate cancer treatment with radiation and seed implants, rectal bleeding, atrial fibrillation status post DCCV 02/2014, gastric bypass, pernicious anemia, SVT, hypertension, coronary calcium score 224 (all in proximal LAD), EtOH (quit), strong family history of CAD who is pending right reverse shoulder arthroplasty, biceps tenodesis and presents today for telephonic preoperative cardiovascular risk assessment.  Past Medical History    Past Medical History:  Diagnosis Date   Aortic atherosclerosis ([place])    Cardiomyopathy (in setting of Afib)    a.) TTE 12/26/2013: EF 45-50%, mild ant and antsept HK. mild MR. Mod dil LA. nl RV fxn. Rhythm was Afib; b.) TTE 07/04/2019: EF 55%, mid-apical anteroseptal HK, mild MAC, mild Ao sclerosis, G2DD, RVSP 45.3   Chronic pain syndrome    a.) followed by pain management   Chronic, continuous use of opioids    a.) hydrocodone/APAP 7.5/325 mg; followed by pain management   Coronary artery  disease 11/06/2014   a.) cCTA 11/06/2014: Ca score 224 (all in pLAD) -- 74th percentile for age/sex matched control   Diverticulosis    DJD (degenerative joint disease) of knee    History of hiatal hernia    History of kidney stones 2012   Hyperlipidemia    Hyperplastic colon polyp    Hypertension    Internal hemorrhoids    Intervertebral disc disorder with radiculopathy of lumbosacral region    Long term current use FULL DOSE (325 mg) aspirin    Long term current use of amiodarone    Long term current use of antithrombotics/antiplatelets    a.) rivaroxaban   Lower extremity edema    Mixed hyperlipidemia    Myalgia due to statin    Osteoarthritis    PAF (paroxysmal atrial fibrillation) ([place])    a.) CHA2DS2VASc = 4 (age, HTN, CHF, vascular disease history);  b.) s/p DCCV 03/13/2014 (200 J x1); c.) s/p DCCV 05/15/2019 (150 J x 1, 200 J x2); d.) rate/rhythm maintained on oral amiodarone + metoprolol succinate; chronically anticoagulated with rivaroxaban   Pernicious anemia    Prostate cancer ([place])    Pulmonary nodule, right    a. 10/2014 Cardiac CTA: 39m RLL nodule; b. 04/2015 CT Chest: stable 760mRLL nodule. No new nodules; 02/2017 CTA Chest: stable, benign, 32m12mLL pulm nodule.   Sleep difficulties    a.) takes melatonin + trazodone PRN   SVT (supraventricular tachycardia) ([facility])    Tubular adenoma of colon    Past Surgical History:  Procedure Laterality Date   CARDIOVERSION N/A 05/15/2019   Procedure: CARDIOVERSION;  Surgeon: Minna Merritts, MD;  Location: [facility] ORS;  Service: Cardiovascular;  Laterality: N/A;   CARDIOVERSION N/A 03/13/2014   Procedure: CARDIOVERSION; Location: [facility]; Surgeon: Ida Rogue, MD   CARPAL TUNNEL RELEASE Right 12/23/2014   Procedure: CARPAL TUNNEL RELEASE;  Surgeon: Earnestine Leys, MD;  Location: [facility] OR[facility];  Service: Orthopedics;  Laterality: Right;   CARPAL TUNNEL RELEASE Left 01/06/2015   Procedure: CARPAL TUNNEL RELEASE;  Surgeon: Earnestine Leys,  MD;  Location: [facility] OR[facility];  Service: Orthopedics;  Laterality: Left;   COLONOSCOPY WITH PROPOFOL N/A 10/08/2017   Procedure: COLONOSCOPY WITH PROPOFOL;  Surgeon: Lin Landsman, MD;  Location: [facility] END[facility]COPY;  Service: Gastroenterology;  Laterality: N/A;   GASTRIC BYPASS  01/17/2000   KNEE SURGERY Right    knee trauma x3   prostate seeding      Allergies  No Known Allergies  History of Present Illness    [name] is a 66 y.o. male who presents via audio/video conferencing for a telehealth visit today.  Pt was last seen in cardiology clinic on 03/14/21 by Dr. Rockey Situ.  At that time [name] was doing well other than some high blood pressures.  The patient is now pending procedure as outlined above. Since his last visit, he is doing fine.  He was diagnosed with anxiety for the first time in his life and that was the reason he was not feeling well before.  Now since being treated he feels great.  He states that he has not had any chest pain or shortness of breath.  He does do stairs and goes up hills okay.  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  While we were on the phone he was at [employer] equipment to remodel his home.  He does have some back issues with sciatica that slows him down from time to time.  Because of this, he has scored a 6.55 METS on the DASI.  This well exceeds the 4 METS minimum requirement.   Per office protocol, patient can hold Xarelto for 3 days prior to procedure.   Home Medications    Prior to Admission medications   Medication Sig Start Date End Date Taking? Authorizing Provider  amiodarone (PACERONE) 200 MG tablet TAKE ONE TABLET BY MOUTH ONE TIME DAILY Patient taking differently: Take 200 mg by mouth every morning. 05/10/21   Minna Merritts, MD  amLODipine (NORVASC) 5 MG tablet Take 1 tablet (5 mg total) by mouth daily. Patient taking differently: Take 5 mg by mouth every evening. 03/14/21 09/21/21  Minna Merritts, MD  Aspirin 325 MG CAPS Take 325  mg by mouth daily.    [provider]  benazepril (LOTENSIN) 40 MG tablet TAKE ONE TABLET BY MOUTH ONE TIME DAILY Patient taking differently: Take 40 mg by mouth every morning. TAKE ONE TABLET BY MOUTH ONE TIME DAILY 08/22/21   Minna Merritts, MD  busPIRone (BUSPAR) 10 MG tablet Take 1 tablet (10 mg total) by mouth 4 (four) times daily as needed (anxiety). Patient taking differently: Take 10 mg by mouth in the morning, at noon, in the evening, and at bedtime. 05/10/21   Karamalegos, Devonne Doughty, DO  carboxymethylcellulose (REFRESH PLUS) 0.5 % SOLN 1 drop 3 (three) times daily as needed.    [provider]  cyanocobalamin (VITAMIN B12) 1000 MCG/ML injection Inject 1 mL (1,000 mcg total) into the skin every 30 (thirty) days. INJECT 1ML EVERY 30 DAYS AS DIRECTED 09/21/21   Parks Ranger, Devonne Doughty,  DO  cyclobenzaprine (FLEXERIL) 10 MG tablet Take 10 mg by mouth every evening.    [provider]  DULoxetine (CYMBALTA) 30 MG capsule Take 1 capsule (30 mg total) by mouth daily. Patient taking differently: Take 30 mg by mouth every morning. 05/10/21   Karamalegos, Devonne Doughty, DO  furosemide (LASIX) 20 MG tablet Take 1 tablet (20 mg total) by mouth daily as needed. 06/21/20   Minna Merritts, MD  HYDROcodone-acetaminophen (NORCO) 7.5-325 MG tablet Take 1 tablet by mouth every 6 (six) hours as needed for severe pain. Must last 30 days 09/22/21 10/22/21  Gillis Santa, MD  HYDROcodone-acetaminophen (NORCO) 7.5-325 MG tablet Take 1 tablet by mouth every 6 (six) hours as needed for severe pain. Must last 30 days Patient not taking: Reported on 09/21/2021 10/22/21 11/21/21  Gillis Santa, MD  ibuprofen (ADVIL) 200 MG tablet Take 400 mg by mouth 2 (two) times daily as needed for moderate pain.    [provider]  melatonin 3 MG TABS tablet Take 3 mg by mouth at bedtime. 03/04/21   [provider]  metoprolol succinate (TOPROL-XL) 50 MG 24 hr tablet Take 1 tablet (50 mg total) by  mouth daily. TAKE 1 TABLET(50 MG) BY MOUTH DAILY WITH OR IMMEDIATELY FOLLOWING A MEAL Patient taking differently: Take 50 mg by mouth at bedtime. TAKE 1 TABLET(50 MG) BY MOUTH DAILY WITH OR IMMEDIATELY FOLLOWING A MEAL 08/22/21   Gollan, Kathlene November, MD  Multiple Vitamin (MULTIVITAMIN WITH MINERALS) TABS tablet Take 1 tablet by mouth daily.    [provider]  rivaroxaban (XARELTO) 20 MG TABS tablet TAKE ONE TABLET BY MOUTH ONE TIME DAILY WITH SUPPER Patient taking differently: 20 mg daily with supper. 05/30/21   Minna Merritts, MD  traZODone (DESYREL) 150 MG tablet Take 1 tablet (150 mg total) by mouth at bedtime. 07/26/20   Olin Hauser, DO    Physical Exam    Vital Signs:  MONTAVIS SCHUBRING does not have vital signs available for review today.  Given telephonic nature of communication, physical exam is limited. AAOx3. NAD. Normal affect.  Speech and respirations are unlabored.  Accessory Clinical Findings    None  Assessment & Plan    1.  Preoperative Cardiovascular Risk Assessment:  [name] perioperative risk of a major cardiac event is 0.9% according to the Revised Cardiac Risk Index (RCRI).  Therefore, he is at low risk for perioperative complications.   His functional capacity is good at 6.55 METs according to the [place] Activity Status Index (DASI). Recommendations: According to ACC/AHA guidelines, no further cardiovascular testing needed.  The patient may proceed to surgery at acceptable risk.   Antiplatelet and/or Anticoagulation Recommendations: Xarelto (Rivaroxaban) can be held for 3 days prior to surgery.  Please resume post op when felt to be safe.     Wayne Collard, PA-C  09/22/2021, 4:03 PM   I spent 10 minutes on the phone with this patient.

## 2021-09-26 ENCOUNTER — Ambulatory Visit: Payer: Medicare Other | Admitting: Student in an Organized Health Care Education/Training Program

## 2021-09-26 MED ORDER — CHLORHEXIDINE GLUCONATE 0.12 % MT SOLN: 15.0000 mL | Freq: Once | OROMUCOSAL | Status: AC

## 2021-09-26 MED ORDER — LACTATED RINGERS IV SOLN: INTRAVENOUS | Status: DC

## 2021-09-26 MED ORDER — FAMOTIDINE 20 MG PO TABS: 20.0000 mg | ORAL_TABLET | Freq: Once | ORAL | Status: AC

## 2021-09-26 MED ORDER — ORAL CARE MOUTH RINSE: 15.0000 mL | Freq: Once | OROMUCOSAL | Status: AC

## 2021-09-26 MED ORDER — CEFAZOLIN IN SODIUM CHLORIDE 3-0.9 GM/100ML-% IV SOLN
3.0000 g | INTRAVENOUS | Status: AC
  Administered 2021-09-27: 3 g via INTRAVENOUS
  Filled 2021-09-26: qty 100

## 2021-09-26 MED ORDER — TRANEXAMIC ACID-NACL 1000-0.7 MG/100ML-% IV SOLN: 1000.0000 mg | INTRAVENOUS | Status: DC

## 2021-09-27 ENCOUNTER — Ambulatory Visit: Payer: Medicare Other | Admitting: Urgent Care

## 2021-09-27 ENCOUNTER — Ambulatory Visit: Payer: Medicare Other

## 2021-09-27 ENCOUNTER — Encounter
Admission: RE | Disposition: A | Discharge status: Home / Self Care | Payer: Self-pay | Source: Home / Self Care | Attending: Orthopedic Surgery

## 2021-09-27 ENCOUNTER — Inpatient Hospital Stay: Payer: Medicare Other

## 2021-09-27 ENCOUNTER — Encounter: Payer: Self-pay | Admitting: Orthopedic Surgery

## 2021-09-27 ENCOUNTER — Other Ambulatory Visit: Payer: Self-pay

## 2021-09-27 ENCOUNTER — Observation Stay
Admission: RE | Admit: 2021-09-27 | Discharge: 2021-09-28 | Disposition: A | Discharge status: Home / Self Care | Payer: Medicare Other | Attending: Orthopedic Surgery | Admitting: Orthopedic Surgery

## 2021-09-27 DIAGNOSIS — Z8546 Personal history of malignant neoplasm of prostate: Secondary | ICD-10-CM | POA: Insufficient documentation

## 2021-09-27 DIAGNOSIS — M19011 Primary osteoarthritis, right shoulder: Secondary | ICD-10-CM | POA: Diagnosis not present

## 2021-09-27 DIAGNOSIS — Z7901 Long term (current) use of anticoagulants: Secondary | ICD-10-CM | POA: Diagnosis not present

## 2021-09-27 DIAGNOSIS — I480 Paroxysmal atrial fibrillation: Secondary | ICD-10-CM | POA: Insufficient documentation

## 2021-09-27 DIAGNOSIS — M7521 Bicipital tendinitis, right shoulder: Secondary | ICD-10-CM | POA: Diagnosis not present

## 2021-09-27 DIAGNOSIS — I2510 Atherosclerotic heart disease of native coronary artery without angina pectoris: Secondary | ICD-10-CM | POA: Diagnosis not present

## 2021-09-27 DIAGNOSIS — F17200 Nicotine dependence, unspecified, uncomplicated: Secondary | ICD-10-CM | POA: Diagnosis not present

## 2021-09-27 DIAGNOSIS — Z7982 Long term (current) use of aspirin: Secondary | ICD-10-CM | POA: Insufficient documentation

## 2021-09-27 DIAGNOSIS — Z87891 Personal history of nicotine dependence: Secondary | ICD-10-CM | POA: Insufficient documentation

## 2021-09-27 DIAGNOSIS — Z79899 Other long term (current) drug therapy: Secondary | ICD-10-CM | POA: Insufficient documentation

## 2021-09-27 DIAGNOSIS — Z471 Aftercare following joint replacement surgery: Secondary | ICD-10-CM | POA: Diagnosis not present

## 2021-09-27 DIAGNOSIS — M75121 Complete rotator cuff tear or rupture of right shoulder, not specified as traumatic: Secondary | ICD-10-CM | POA: Diagnosis not present

## 2021-09-27 DIAGNOSIS — G8929 Other chronic pain: Principal | ICD-10-CM

## 2021-09-27 DIAGNOSIS — M75111 Incomplete rotator cuff tear or rupture of right shoulder, not specified as traumatic: Principal | ICD-10-CM | POA: Insufficient documentation

## 2021-09-27 DIAGNOSIS — Z96611 Presence of right artificial shoulder joint: Secondary | ICD-10-CM | POA: Diagnosis not present

## 2021-09-27 DIAGNOSIS — Z96619 Presence of unspecified artificial shoulder joint: Secondary | ICD-10-CM

## 2021-09-27 DIAGNOSIS — M75112 Incomplete rotator cuff tear or rupture of left shoulder, not specified as traumatic: Secondary | ICD-10-CM | POA: Diagnosis not present

## 2021-09-27 DIAGNOSIS — G8918 Other acute postprocedural pain: Secondary | ICD-10-CM | POA: Diagnosis not present

## 2021-09-27 DIAGNOSIS — I129 Hypertensive chronic kidney disease with stage 1 through stage 4 chronic kidney disease, or unspecified chronic kidney disease: Secondary | ICD-10-CM | POA: Diagnosis not present

## 2021-09-27 DIAGNOSIS — N189 Chronic kidney disease, unspecified: Secondary | ICD-10-CM | POA: Diagnosis not present

## 2021-09-27 HISTORY — DX: Opioid use, unspecified, uncomplicated: F11.90

## 2021-09-27 HISTORY — PX: REVERSE SHOULDER ARTHROPLASTY: SHX5054

## 2021-09-27 HISTORY — PX: BICEPT TENODESIS: SHX5116

## 2021-09-27 HISTORY — DX: Sleep disorder, unspecified: G47.9

## 2021-09-27 HISTORY — DX: Long term (current) use of aspirin: Z79.82

## 2021-09-27 LAB — ABO/RH: ABO/RH(D): O NEG

## 2021-09-27 SURGERY — ARTHROPLASTY, SHOULDER, TOTAL, REVERSE
Anesthesia: General | Site: Shoulder | Laterality: Right

## 2021-09-27 MED ORDER — ROCURONIUM BROMIDE 100 MG/10ML IV SOLN
INTRAVENOUS | Status: DC | PRN
  Administered 2021-09-27: 50 mg via INTRAVENOUS
  Administered 2021-09-27: 10 mg via INTRAVENOUS
  Administered 2021-09-27: 40 mg via INTRAVENOUS

## 2021-09-27 MED ORDER — CYCLOBENZAPRINE HCL 10 MG PO TABS
10.0000 mg | ORAL_TABLET | Freq: Every evening | ORAL | Status: DC
  Administered 2021-09-27: 10 mg via ORAL
  Filled 2021-09-27: qty 1

## 2021-09-27 MED ORDER — PHENYLEPHRINE 80 MCG/ML (10ML) SYRINGE FOR IV PUSH (FOR BLOOD PRESSURE SUPPORT)
PREFILLED_SYRINGE | INTRAVENOUS | Status: DC | PRN
  Administered 2021-09-27: 160 ug via INTRAVENOUS
  Administered 2021-09-27: 80 ug via INTRAVENOUS
  Administered 2021-09-27 (×3): 160 ug via INTRAVENOUS

## 2021-09-27 MED ORDER — BUPIVACAINE HCL (PF) 0.5 % IJ SOLN
INTRAMUSCULAR | Status: DC | PRN
  Administered 2021-09-27: 10 mL via PERINEURAL

## 2021-09-27 MED ORDER — SODIUM CHLORIDE 0.9 % IR SOLN
Status: DC | PRN
  Administered 2021-09-27 (×2): 1004 mL

## 2021-09-27 MED ORDER — TRAZODONE HCL 50 MG PO TABS
150.0000 mg | ORAL_TABLET | Freq: Every day | ORAL | Status: DC
  Administered 2021-09-27: 150 mg via ORAL
  Filled 2021-09-27: qty 1

## 2021-09-27 MED ORDER — FENTANYL CITRATE (PF) 100 MCG/2ML IJ SOLN
INTRAMUSCULAR | Status: DC | PRN
  Administered 2021-09-27: 50 ug via INTRAVENOUS

## 2021-09-27 MED ORDER — MIDAZOLAM HCL 2 MG/2ML IJ SOLN
INTRAMUSCULAR | Status: DC | PRN
  Administered 2021-09-27: 2 mg via INTRAVENOUS

## 2021-09-27 MED ORDER — PROPOFOL 10 MG/ML IV BOLUS
INTRAVENOUS | Status: DC | PRN
  Administered 2021-09-27: 50 mg via INTRAVENOUS
  Administered 2021-09-27: 200 mg via INTRAVENOUS

## 2021-09-27 MED ORDER — REMIFENTANIL HCL 1 MG IV SOLR
INTRAVENOUS | Status: DC | PRN
  Administered 2021-09-27: .08 ug/kg/min via INTRAVENOUS

## 2021-09-27 MED ORDER — PROPOFOL 1000 MG/100ML IV EMUL
INTRAVENOUS | Status: AC
  Filled 2021-09-27: qty 100

## 2021-09-27 MED ORDER — ADULT MULTIVITAMIN W/MINERALS CH
1.0000 | ORAL_TABLET | Freq: Every day | ORAL | Status: DC
  Administered 2021-09-28: 1 via ORAL
  Filled 2021-09-27: qty 1

## 2021-09-27 MED ORDER — DOCUSATE SODIUM 100 MG PO CAPS
100.0000 mg | ORAL_CAPSULE | Freq: Two times a day (BID) | ORAL | Status: DC
  Administered 2021-09-27 – 2021-09-28 (×2): 100 mg via ORAL
  Filled 2021-09-27 (×2): qty 1

## 2021-09-27 MED ORDER — BUPIVACAINE LIPOSOME 1.3 % IJ SUSP
INTRAMUSCULAR | Status: AC
  Filled 2021-09-27: qty 10

## 2021-09-27 MED ORDER — ONDANSETRON HCL 4 MG/2ML IJ SOLN: 4.0000 mg | Freq: Four times a day (QID) | INTRAMUSCULAR | Status: DC | PRN

## 2021-09-27 MED ORDER — DULOXETINE HCL 30 MG PO CPEP
30.0000 mg | ORAL_CAPSULE | Freq: Every day | ORAL | Status: DC
  Administered 2021-09-28: 30 mg via ORAL
  Filled 2021-09-27: qty 1

## 2021-09-27 MED ORDER — METOCLOPRAMIDE HCL 5 MG PO TABS: 5.0000 mg | ORAL_TABLET | Freq: Three times a day (TID) | ORAL | Status: DC | PRN

## 2021-09-27 MED ORDER — CYANOCOBALAMIN 1000 MCG/ML IJ SOLN: 1000.0000 ug | INTRAMUSCULAR | Status: DC

## 2021-09-27 MED ORDER — LIDOCAINE HCL (PF) 1 % IJ SOLN
INTRAMUSCULAR | Status: AC
  Filled 2021-09-27: qty 5

## 2021-09-27 MED ORDER — MIDAZOLAM HCL 2 MG/2ML IJ SOLN
INTRAMUSCULAR | Status: AC
  Administered 2021-09-27: 1 mg via INTRAVENOUS
  Filled 2021-09-27: qty 2

## 2021-09-27 MED ORDER — BENAZEPRIL HCL 20 MG PO TABS
40.0000 mg | ORAL_TABLET | Freq: Every day | ORAL | Status: DC
  Administered 2021-09-28: 40 mg via ORAL
  Filled 2021-09-27: qty 2

## 2021-09-27 MED ORDER — TRANEXAMIC ACID-NACL 1000-0.7 MG/100ML-% IV SOLN
INTRAVENOUS | Status: AC
  Filled 2021-09-27: qty 100

## 2021-09-27 MED ORDER — VASOPRESSIN 20 UNIT/ML IV SOLN
INTRAVENOUS | Status: DC | PRN
  Administered 2021-09-27 (×4): 1 [IU] via INTRAVENOUS

## 2021-09-27 MED ORDER — ONDANSETRON HCL 4 MG/2ML IJ SOLN: 4.0000 mg | Freq: Once | INTRAMUSCULAR | Status: DC | PRN

## 2021-09-27 MED ORDER — MIDAZOLAM HCL 2 MG/2ML IJ SOLN
INTRAMUSCULAR | Status: AC
  Filled 2021-09-27: qty 2

## 2021-09-27 MED ORDER — MIDAZOLAM HCL 2 MG/2ML IJ SOLN: 1.0000 mg | Freq: Once | INTRAMUSCULAR | Status: AC

## 2021-09-27 MED ORDER — EPHEDRINE SULFATE (PRESSORS) 50 MG/ML IJ SOLN
INTRAMUSCULAR | Status: DC | PRN
  Administered 2021-09-27 (×3): 5 mg via INTRAVENOUS
  Administered 2021-09-27 (×2): 10 mg via INTRAVENOUS

## 2021-09-27 MED ORDER — PHENYLEPHRINE HCL-NACL 20-0.9 MG/250ML-% IV SOLN
INTRAVENOUS | Status: DC | PRN
  Administered 2021-09-27: 25 ug/min via INTRAVENOUS

## 2021-09-27 MED ORDER — GLYCOPYRROLATE 0.2 MG/ML IJ SOLN
INTRAMUSCULAR | Status: DC | PRN
  Administered 2021-09-27 (×2): .2 mg via INTRAVENOUS

## 2021-09-27 MED ORDER — CEFAZOLIN IN SODIUM CHLORIDE 3-0.9 GM/100ML-% IV SOLN
3.0000 g | Freq: Four times a day (QID) | INTRAVENOUS | Status: AC
  Administered 2021-09-27 – 2021-09-28 (×3): 3 g via INTRAVENOUS
  Filled 2021-09-27 (×3): qty 100

## 2021-09-27 MED ORDER — SUGAMMADEX SODIUM 500 MG/5ML IV SOLN
INTRAVENOUS | Status: DC | PRN
  Administered 2021-09-27: 200 mg via INTRAVENOUS
  Administered 2021-09-27: 500 mg via INTRAVENOUS

## 2021-09-27 MED ORDER — SODIUM CHLORIDE 0.9 % IV SOLN: INTRAVENOUS | Status: DC

## 2021-09-27 MED ORDER — FENTANYL CITRATE (PF) 100 MCG/2ML IJ SOLN: 25.0000 ug | INTRAMUSCULAR | Status: DC | PRN

## 2021-09-27 MED ORDER — CHLORHEXIDINE GLUCONATE 0.12 % MT SOLN
OROMUCOSAL | Status: AC
  Administered 2021-09-27: 15 mL via OROMUCOSAL
  Filled 2021-09-27: qty 15

## 2021-09-27 MED ORDER — ASPIRIN 325 MG PO TBEC
325.0000 mg | DELAYED_RELEASE_TABLET | Freq: Every day | ORAL | Status: DC
  Administered 2021-09-28: 325 mg via ORAL
  Filled 2021-09-27: qty 1

## 2021-09-27 MED ORDER — FENTANYL CITRATE (PF) 100 MCG/2ML IJ SOLN
INTRAMUSCULAR | Status: AC
  Filled 2021-09-27: qty 2

## 2021-09-27 MED ORDER — AMIODARONE HCL 200 MG PO TABS
200.0000 mg | ORAL_TABLET | Freq: Every day | ORAL | Status: DC
  Administered 2021-09-28: 200 mg via ORAL
  Filled 2021-09-27: qty 1

## 2021-09-27 MED ORDER — RIVAROXABAN 20 MG PO TABS: 20.0000 mg | ORAL_TABLET | Freq: Every day | ORAL | Status: DC

## 2021-09-27 MED ORDER — PROPOFOL 10 MG/ML IV BOLUS
INTRAVENOUS | Status: AC
  Filled 2021-09-27: qty 20

## 2021-09-27 MED ORDER — LIDOCAINE HCL (CARDIAC) PF 100 MG/5ML IV SOSY
PREFILLED_SYRINGE | INTRAVENOUS | Status: DC | PRN
  Administered 2021-09-27: 40 mg via INTRAVENOUS

## 2021-09-27 MED ORDER — VANCOMYCIN HCL 1000 MG IV SOLR
INTRAVENOUS | Status: AC
  Filled 2021-09-27: qty 20

## 2021-09-27 MED ORDER — BUPIVACAINE-EPINEPHRINE (PF) 0.25% -1:200000 IJ SOLN
INTRAMUSCULAR | Status: AC
  Filled 2021-09-27: qty 30

## 2021-09-27 MED ORDER — NEOMYCIN-POLYMYXIN B GU 40-200000 IR SOLN
Status: AC
  Filled 2021-09-27: qty 20

## 2021-09-27 MED ORDER — OXYCODONE HCL 5 MG PO TABS
10.0000 mg | ORAL_TABLET | ORAL | Status: DC | PRN
  Filled 2021-09-27: qty 3

## 2021-09-27 MED ORDER — PHENOL 1.4 % MT LIQD: 1.0000 | OROMUCOSAL | Status: DC | PRN

## 2021-09-27 MED ORDER — FENTANYL CITRATE PF 50 MCG/ML IJ SOSY: 50.0000 ug | PREFILLED_SYRINGE | Freq: Once | INTRAMUSCULAR | Status: AC

## 2021-09-27 MED ORDER — MELATONIN 5 MG PO TABS
5.0000 mg | ORAL_TABLET | Freq: Every day | ORAL | Status: DC
  Administered 2021-09-27: 5 mg via ORAL
  Filled 2021-09-27: qty 1

## 2021-09-27 MED ORDER — TRANEXAMIC ACID-NACL 1000-0.7 MG/100ML-% IV SOLN
INTRAVENOUS | Status: AC
  Administered 2021-09-27: 1000 mg
  Filled 2021-09-27: qty 100

## 2021-09-27 MED ORDER — ACETAMINOPHEN 500 MG PO TABS
1000.0000 mg | ORAL_TABLET | Freq: Three times a day (TID) | ORAL | Status: DC
  Administered 2021-09-27 – 2021-09-28 (×3): 1000 mg via ORAL
  Filled 2021-09-27 (×3): qty 2

## 2021-09-27 MED ORDER — OXYCODONE HCL 5 MG PO TABS
5.0000 mg | ORAL_TABLET | ORAL | Status: DC | PRN
  Administered 2021-09-27: 10 mg via ORAL
  Filled 2021-09-27: qty 2

## 2021-09-27 MED ORDER — POLYVINYL ALCOHOL 1.4 % OP SOLN: 1.0000 [drp] | Freq: Three times a day (TID) | OPHTHALMIC | Status: DC | PRN

## 2021-09-27 MED ORDER — ALUM & MAG HYDROXIDE-SIMETH 200-200-20 MG/5ML PO SUSP: 30.0000 mL | ORAL | Status: DC | PRN

## 2021-09-27 MED ORDER — FAMOTIDINE 20 MG PO TABS
ORAL_TABLET | ORAL | Status: AC
  Administered 2021-09-27: 20 mg via ORAL
  Filled 2021-09-27: qty 1

## 2021-09-27 MED ORDER — HYDROCODONE-ACETAMINOPHEN 7.5-325 MG PO TABS
1.0000 | ORAL_TABLET | Freq: Four times a day (QID) | ORAL | Status: DC | PRN
  Administered 2021-09-27 – 2021-09-28 (×3): 1 via ORAL
  Filled 2021-09-27 (×3): qty 1

## 2021-09-27 MED ORDER — ACETAMINOPHEN 10 MG/ML IV SOLN
INTRAVENOUS | Status: AC
  Filled 2021-09-27: qty 100

## 2021-09-27 MED ORDER — TRANEXAMIC ACID-NACL 1000-0.7 MG/100ML-% IV SOLN
1000.0000 mg | Freq: Once | INTRAVENOUS | Status: DC
  Filled 2021-09-27: qty 100

## 2021-09-27 MED ORDER — DEXAMETHASONE SODIUM PHOSPHATE 10 MG/ML IJ SOLN
INTRAMUSCULAR | Status: DC | PRN
  Administered 2021-09-27: 10 mg via INTRAVENOUS

## 2021-09-27 MED ORDER — BISACODYL 10 MG RE SUPP: 10.0000 mg | Freq: Every day | RECTAL | Status: DC | PRN

## 2021-09-27 MED ORDER — LIDOCAINE HCL (PF) 1 % IJ SOLN
INTRAMUSCULAR | Status: DC | PRN
  Administered 2021-09-27: 4 mL via SUBCUTANEOUS

## 2021-09-27 MED ORDER — ONDANSETRON HCL 4 MG PO TABS: 4.0000 mg | ORAL_TABLET | Freq: Four times a day (QID) | ORAL | Status: DC | PRN

## 2021-09-27 MED ORDER — HYDROMORPHONE HCL 1 MG/ML IJ SOLN: 0.2000 mg | INTRAMUSCULAR | Status: DC | PRN

## 2021-09-27 MED ORDER — SENNOSIDES-DOCUSATE SODIUM 8.6-50 MG PO TABS
1.0000 | ORAL_TABLET | Freq: Every evening | ORAL | Status: DC | PRN
  Administered 2021-09-27: 1 via ORAL
  Filled 2021-09-27: qty 1

## 2021-09-27 MED ORDER — ONDANSETRON HCL 4 MG/2ML IJ SOLN
INTRAMUSCULAR | Status: DC | PRN
  Administered 2021-09-27: 4 mg via INTRAVENOUS

## 2021-09-27 MED ORDER — METOPROLOL SUCCINATE ER 50 MG PO TB24
50.0000 mg | ORAL_TABLET | Freq: Every day | ORAL | Status: DC
  Administered 2021-09-27: 50 mg via ORAL
  Filled 2021-09-27 (×2): qty 1

## 2021-09-27 MED ORDER — BUPIVACAINE LIPOSOME 1.3 % IJ SUSP
INTRAMUSCULAR | Status: AC
  Filled 2021-09-27: qty 20

## 2021-09-27 MED ORDER — SUCCINYLCHOLINE CHLORIDE 200 MG/10ML IV SOSY
PREFILLED_SYRINGE | INTRAVENOUS | Status: DC | PRN
  Administered 2021-09-27: 120 mg via INTRAVENOUS

## 2021-09-27 MED ORDER — ORAL CARE MOUTH RINSE: 15.0000 mL | OROMUCOSAL | Status: DC | PRN

## 2021-09-27 MED ORDER — BUPIVACAINE HCL (PF) 0.5 % IJ SOLN
INTRAMUSCULAR | Status: AC
  Filled 2021-09-27: qty 10

## 2021-09-27 MED ORDER — VANCOMYCIN HCL 1000 MG IV SOLR
INTRAVENOUS | Status: DC | PRN
  Administered 2021-09-27: 1000 mg via TOPICAL

## 2021-09-27 MED ORDER — FENTANYL CITRATE PF 50 MCG/ML IJ SOSY
PREFILLED_SYRINGE | INTRAMUSCULAR | Status: AC
  Administered 2021-09-27: 50 ug via INTRAVENOUS
  Filled 2021-09-27: qty 1

## 2021-09-27 MED ORDER — MENTHOL 3 MG MT LOZG: 1.0000 | LOZENGE | OROMUCOSAL | Status: DC | PRN

## 2021-09-27 MED ORDER — BUSPIRONE HCL 10 MG PO TABS
10.0000 mg | ORAL_TABLET | Freq: Four times a day (QID) | ORAL | Status: DC | PRN
  Administered 2021-09-27 – 2021-09-28 (×3): 10 mg via ORAL
  Filled 2021-09-27 (×3): qty 1

## 2021-09-27 MED ORDER — METOCLOPRAMIDE HCL 5 MG/ML IJ SOLN: 5.0000 mg | Freq: Three times a day (TID) | INTRAMUSCULAR | Status: DC | PRN

## 2021-09-27 MED ORDER — REMIFENTANIL HCL 1 MG IV SOLR
INTRAVENOUS | Status: AC
  Filled 2021-09-27: qty 1000

## 2021-09-27 MED ORDER — BUPIVACAINE LIPOSOME 1.3 % IJ SUSP
INTRAMUSCULAR | Status: DC | PRN
  Administered 2021-09-27: 20 mL via PERINEURAL

## 2021-09-27 MED ORDER — SEVOFLURANE IN SOLN
RESPIRATORY_TRACT | Status: AC
  Filled 2021-09-27: qty 250

## 2021-09-27 SURGICAL SUPPLY — 73 items
BASEPLATE P2 COATD GLND 6.5X30 (Shoulder) IMPLANT
BIT DRILL 4 DIA CALIBRATED (BIT) IMPLANT
BIT DRILL GUIDE PATIENT MATCH (MISCELLANEOUS) IMPLANT
BLADE SAGITTAL WIDE XTHICK NO (BLADE) ×1 IMPLANT
CHLORAPREP W/TINT 26 (MISCELLANEOUS) ×1 IMPLANT
CNTNR SPEC 2.5X3XGRAD LEK (MISCELLANEOUS) ×1
CONT SPEC 4OZ STER OR WHT (MISCELLANEOUS) ×1
CONTAINER SPEC 2.5X3XGRAD LEK (MISCELLANEOUS) ×1 IMPLANT
COOLER POLAR GLACIER W/PUMP (MISCELLANEOUS) ×1 IMPLANT
COVER BACK TABLE REUSABLE LG (DRAPES) ×1 IMPLANT
DERMABOND ADVANCED .7 DNX12 (GAUZE/BANDAGES/DRESSINGS) ×1 IMPLANT
DRAPE 3/4 80X56 (DRAPES) ×2 IMPLANT
DRAPE INCISE IOBAN 66X45 STRL (DRAPES) ×2 IMPLANT
DRAPE U-SHAPE 47X51 STRL (DRAPES) ×1 IMPLANT
DRILL GUIDE PATIENT MATCH (MISCELLANEOUS) ×1
DRSG OPSITE POSTOP 3X4 (GAUZE/BANDAGES/DRESSINGS) ×1 IMPLANT
DRSG OPSITE POSTOP 4X6 (GAUZE/BANDAGES/DRESSINGS) ×1 IMPLANT
DRSG OPSITE POSTOP 4X8 (GAUZE/BANDAGES/DRESSINGS) ×1 IMPLANT
DRSG TEGADERM 4X10 (GAUZE/BANDAGES/DRESSINGS) ×3 IMPLANT
ELECT REM PT RETURN 9FT ADLT (ELECTROSURGICAL) ×1
ELECTRODE REM PT RTRN 9FT ADLT (ELECTROSURGICAL) ×1 IMPLANT
GAUZE XEROFORM 1X8 LF (GAUZE/BANDAGES/DRESSINGS) ×1 IMPLANT
GLOVE BIOGEL PI IND STRL 8 (GLOVE) ×2 IMPLANT
GLOVE PI ULTRA LF STRL 7.5 (GLOVE) ×2 IMPLANT
GLOVE SURG ORTHO 8.0 STRL STRW (GLOVE) ×2 IMPLANT
GLOVE SURG SYN 8.0 (GLOVE) ×1 IMPLANT
GLOVE SURG SYN 8.0 PF PI (GLOVE) ×1 IMPLANT
GOWN STRL REUS W/ TWL LRG LVL3 (GOWN DISPOSABLE) ×2 IMPLANT
GOWN STRL REUS W/ TWL XL LVL3 (GOWN DISPOSABLE) ×1 IMPLANT
GOWN STRL REUS W/TWL LRG LVL3 (GOWN DISPOSABLE) ×1
GOWN STRL REUS W/TWL XL LVL3 (GOWN DISPOSABLE) ×2
HEAD GLENOID W/SCREW 32MM (Shoulder) IMPLANT
HEMOVAC 400CC 10FR (MISCELLANEOUS) ×1 IMPLANT
HOOD PEEL AWAY FLYTE STAYCOOL (MISCELLANEOUS) ×3 IMPLANT
INSERT EPOLY STND HUMERUS 36MM (Shoulder) ×1 IMPLANT
INSERT EPOLYSTD HUMERUS 36MM (Shoulder) IMPLANT
KIT STABILIZATION SHOULDER (MISCELLANEOUS) ×1 IMPLANT
MANIFOLD NEPTUNE II (INSTRUMENTS) ×1 IMPLANT
MASK FACE SPIDER DISP (MASK) ×1 IMPLANT
MAT ABSORB  FLUID 56X50 GRAY (MISCELLANEOUS) ×1
MAT ABSORB FLUID 56X50 GRAY (MISCELLANEOUS) ×2 IMPLANT
NDL REVERSE CUT 1/2 CRC (NEEDLE) ×1 IMPLANT
NDL SPNL 20GX3.5 QUINCKE YW (NEEDLE) ×1 IMPLANT
NEEDLE REVERSE CUT 1/2 CRC (NEEDLE) ×1 IMPLANT
NEEDLE SPNL 20GX3.5 QUINCKE YW (NEEDLE) ×1 IMPLANT
NS IRRIG 1000ML POUR BTL (IV SOLUTION) ×1 IMPLANT
P2 COATDE GLNOID BSEPLT 6.5X30 (Shoulder) ×1 IMPLANT
PACK ARTHROSCOPY SHOULDER (MISCELLANEOUS) ×1 IMPLANT
PAD WRAPON POLAR SHDR XLG (MISCELLANEOUS) ×1 IMPLANT
PULSAVAC PLUS IRRIG FAN TIP (DISPOSABLE) ×1
SCREW BONE LOCKING RSP 5.0X34 (Screw) ×1 IMPLANT
SCREW BONE RSP LOCK 5X18 (Screw) IMPLANT
SCREW BONE RSP LOCK 5X22 (Screw) IMPLANT
SCREW BONE RSP LOCK 5X34 (Screw) IMPLANT
SCREW BONE RSP LOCKING 18MM LG (Screw) ×1 IMPLANT
SCREW BONE RSP LOCKING 5.0X32 (Screw) ×2 IMPLANT
SPONGE T-LAP 18X18 ~~LOC~~+RFID (SPONGE) ×5 IMPLANT
STEM HUM STD SHORT 18X48 (Stem) IMPLANT
STRAP SAFETY 5IN WIDE (MISCELLANEOUS) ×2 IMPLANT
SUT ETHIBOND 5-0 MS/4 CCS GRN (SUTURE)
SUT FIBERWIRE #2 38 BLUE 1/2 (SUTURE) ×2
SUT MNCRL AB 4-0 PS2 18 (SUTURE) IMPLANT
SUT TICRON 2-0 30IN 311381 (SUTURE) ×3 IMPLANT
SUT VIC AB 0 CT1 36 (SUTURE) ×1 IMPLANT
SUT VIC AB 2-0 CT2 27 (SUTURE) ×2 IMPLANT
SUTURE ETHBND 5-0 MS/4 CCS GRN (SUTURE) IMPLANT
SUTURE FIBERWR #2 38 BLUE 1/2 (SUTURE) ×4 IMPLANT
SYR 20ML LL LF (SYRINGE) ×1 IMPLANT
SYR 30ML LL (SYRINGE) ×2 IMPLANT
TIP FAN IRRIG PULSAVAC PLUS (DISPOSABLE) ×1 IMPLANT
TRAP FLUID SMOKE EVACUATOR (MISCELLANEOUS) ×1 IMPLANT
WATER STERILE IRR 500ML POUR (IV SOLUTION) ×1 IMPLANT
WRAPON POLAR PAD SHDR XLG (MISCELLANEOUS) ×1

## 2021-09-27 NOTE — Anesthesia Postprocedure Evaluation (Signed)
Anesthesia Post Note  Patient: [name]  Procedure(s) Performed: Right reverse shoulder arthroplasty, biceps tenodesis (Right: Shoulder) Right reverse shoulder arthroplasty, biceps tenodesis (Right: Shoulder)  Patient location during evaluation: PACU Anesthesia Type: General and Regional Level of consciousness: awake and alert Pain management: pain level controlled Vital Signs Assessment: post-procedure vital signs reviewed and stable Respiratory status: spontaneous breathing, nonlabored ventilation, respiratory function stable and patient connected to nasal cannula oxygen Cardiovascular status: blood pressure returned to baseline and stable Postop Assessment: no apparent nausea or vomiting Anesthetic complications: no   No notable events documented.   Last Vitals:  Vitals:   09/27/21 1115 09/27/21 1135  BP: (!) 110/58 (!) 99/54  Pulse: 63 (!) 57  Resp: 17 16  Temp: 36.4 C 36.7 C  SpO2: 94% 93%    Last Pain:  Vitals:   09/27/21 1212  TempSrc:   PainSc: 0-No pain                 Martha Clan

## 2021-09-27 NOTE — Transfer of Care (Signed)
Immediate Anesthesia Transfer of Care Note  Patient: [name]  Procedure(s) Performed: Right reverse shoulder arthroplasty, biceps tenodesis (Right: Shoulder) Right reverse shoulder arthroplasty, biceps tenodesis (Right: Shoulder)  Patient Location: PACU  Anesthesia Type:General  Level of Consciousness: alert , drowsy and patient cooperative  Airway & Oxygen Therapy: Patient Spontanous Breathing and Patient connected to face mask oxygen  Post-op Assessment: Report given to RN and Post -op Vital signs reviewed and stable  Post vital signs: Reviewed and stable  Last Vitals:  Vitals Value Taken Time  BP 137/72 09/27/21 1036  Temp    Pulse 65 09/27/21 1037  Resp 25 09/27/21 1037  SpO2 95 % 09/27/21 1037  Vitals shown include unvalidated device data.  Last Pain:  Vitals:   09/27/21 0634  TempSrc: Temporal  PainSc: 0-No pain      Patients Stated Pain Goal: 0 (82/95/62 1308)  Complications: No notable events documented.

## 2021-09-27 NOTE — Op Note (Signed)
SURGERY DATE: 09/27/2021   PRE-OP DIAGNOSIS:  1. Right shoulder rotator cuff arthropathy   POST-OP DIAGNOSIS:  1. Right shoulder rotator cuff arthropathy   PROCEDURES:  1. Right reverse total shoulder arthroplasty 2. Right biceps tenodesis   SURGEON: Cato Mulligan, MD  ASSISTANTS: Evette Georges, PA-S    ANESTHESIA: Gen + interscalene block   ESTIMATED BLOOD LOSS:150cc   TOTAL IV FLUIDS: per anesthesia record  IMPLANTS: DJO Surgical: RSP Glenoid Head w/Retaining screw 36N; Monoblock Reverse Shoulder Baseplate with 0.3TW central screw; 4 locking screws into baseplate; Standard Shell Short Humeral Stem 18 x 86m; Neutral Small Socket Insert    INDICATION(S):  Wayne HARKINSis a 66y.o. male with chronic shoulder pain. Imaging consistent with massive, irreparable rotator cuff tear. Conservative measures including medications and cortisone injections have not provided adequate relief. After discussion of risks, benefits, and alternatives to surgery, the patient elected to proceed with reverse shoulder arthroplasty and biceps tenodesis.   OPERATIVE FINDINGS: massive rotator cuff tear (complete supraspinatus, partial infraspinatus, complete subscapularis)   OPERATIVE REPORT:   I identified Elgar J Lonigro in the pre-operative holding area. Informed consent was obtained and the surgical site was marked. I reviewed the risks and benefits of the proposed surgical intervention and the patient (and/or patient's guardian) wished to proceed. An interscalene block with Exparel was administered by the Anesthesia team. The patient was transferred to the operative suite and general anesthesia was administered. The patient was placed in the beach chair position with the head of the bed elevated approximately 45 degrees. All down side pressure points were appropriately padded. Pre-op exam under anesthesia confirmed some stiffness and crepitus. Appropriate IV antibiotics were administered. The extremity was  then prepped and draped in standard fashion. A time out was performed confirming the correct extremity, correct patient, and correct procedure.   We used the standard deltopectoral incision from the coracoid to ~12cm distal. We found the cephalic vein and took it laterally.  The cephalic vein was nicked longitudinally during exposure and was cauterized and tied off with 2-0 nylon. We opened the deltopectoral interval widely and placed retractors under the CA ligament in the subacromial space and under the deltoid tendon at its insertion. We then abducted and internally rotated the arm and released the underlying bursa between these retractors, taking care not to damage the circumflex branch of the axillary nerve.   Next, we brought the arm back in adduction at slight forward flexion with external rotation. We opened the clavipectoral fascia lateral to the conjoint tendon. We gently palpated the axillary nerve and verified its position and continuity on both sides of the humerus with a Tug test. This test was repeated multiple times during the procedure for nerve localization and confirmed to be intact at the end of the case. We then cauterized the anterior humeral circumflex ("Three sisters") vessels. The arm was then internally rotated, we cut the falciform ligament at approximately 1 cm of the upper portion of the pectoralis major insertion. Next we unroofed the bicipital groove. We proceeded with a soft tissue biceps tenodesis given the pathology (complete medial subluxation out of the bicipital groove) of the tendon.  After opening the biceps tendon sheath all the way to the supraglenoid tubercle, we performed a biceps tenodesis with two #2 TiCron sutures to the upper border of the pectoralis major. The proximal portion of the tendon was excised.   At this point, we could see that the supraspinatus was completely torn with a bald  humeral head superiorly. The subscapularis also had an extensive  full-thickness tear with retraction to the glenoid. We performed a subscapularis peel using electrocautery to remove the remnants of the anterior capsule and subscapularis off of the humeral head. We released the inferior capsule from the humerus all the way to the posterior band of the inferior glenohumeral ligament. When this was complete we gently dislocated the shoulder up into the wound. We removed any osteophytes and made our cut with the appropriate inclination in 30 degrees of retroversion  We then turned our attention back to the glenoid. The proximal humerus was retracted posteriorly. The anterior capsule was dissected free from the subscapularis. The anterior capsule was then excised, exposing the anterior glenoid. We then grasped the labrum and removed it circumferentially. During the glenoid exposure, the axillary nerve was protected the entire time.    A patient-specific guide was used to drill the central guidepin. A tap was placed, matching the trajectory of the guidepin. An appropriately sized reamer was used to ream the glenoid. The monoblock baseplate was inserted and excellent fixation was achieved such that the entire scapula rotated with further attempted seating of the baseplate. The peripheral screws were drilled, measured, and placed. A 36N glenosphere was then placed and tightened.   We then turned our attention back to the humerus. We sized for a small shell prosthesis. We sequentially used larger diameter canal finders until we met significant resistance and sequential broaching was performed to this size listed above. Trial poly was placed. The humerus was trialed and noted to have satisfactory stability, motion, and deltoid tension with a 0 poly. The trial implants were removed. Next, the implant was placed with the appropriate retroversion. Stability was confirmed. We placed an 0 poly. The humerus was reduced and motion, tension, and stability were satisfactory. A Hemovac drain  was placed.  Subscapularis was not repaired as it could not appropriately reach the lesser tuberosity.  We again verified the tension on the axillary nerve, appropriate range of motion, stability of the implant, and security of the subscapularis repair. We closed the deltopectoral interval deep to the cephalic vein with a running, 0-Vicryl suture. The skin was closed with 2-0 Vicryl and 4-0 Monocryl. Xeroform and Honeycomb dressing was applied. A PolarCare unit and sling were placed. Patient was extubated, transferred to a stretcher bed and to the post antesthesia care unit in stable condition.   POSTOPERATIVE PLAN: The patient will be admitted with plan for discharge home on POD#1. Operative arm to remain in sling at all times except RoM exercises and hygiene. Can perform pendulums, elbow/wrist/hand RoM exercises. Passive RoM allowed to 90 FF and 30 ER. ASA 353m x 6 weeks for DVT ppx. Plan for PT starting on POD #3-4. Patient to return to clinic in ~2 weeks for post-operative appointment.

## 2021-09-27 NOTE — Anesthesia Procedure Notes (Signed)
Procedure Name: Intubation Date/Time: 09/27/2021 7:48 AM  Performed by: Kelton Pillar, CRNAPre-anesthesia Checklist: Patient identified, Emergency Drugs available, Suction available and Patient being monitored Patient Re-evaluated:Patient Re-evaluated prior to induction Oxygen Delivery Method: Circle system utilized Preoxygenation: Pre-oxygenation with 100% oxygen Induction Type: IV induction Ventilation: Mask ventilation without difficulty Laryngoscope Size: McGraph and 4 Grade View: Grade I Tube type: Oral Tube size: 7.5 mm Number of attempts: 1 Airway Equipment and Method: Stylet and Oral airway Placement Confirmation: ETT inserted through vocal cords under direct vision, positive ETCO2, breath sounds checked- equal and bilateral and CO2 detector Secured at: 21 cm Tube secured with: Tape Dental Injury: Teeth and Oropharynx as per pre-operative assessment

## 2021-09-27 NOTE — Anesthesia Preprocedure Evaluation (Signed)
Anesthesia Evaluation  Patient identified by MRN, date of birth, ID band Patient awake    Reviewed: Allergy & Precautions, H&P , NPO status , Patient's Chart, lab work & pertinent test results  History of Anesthesia Complications Negative for: history of anesthetic complications  Airway Mallampati: III  TM Distance: <3 FB Neck ROM: limited    Dental  (+) Chipped, Poor Dentition   Pulmonary neg pulmonary ROS, neg shortness of breath, former smoker,           Cardiovascular Exercise Tolerance: Good hypertension, (-) angina+ CAD  (-) Past MI + dysrhythmias Atrial Fibrillation      Neuro/Psych Seizures - (2/2 A fib per patient),   Neuromuscular disease negative psych ROS   GI/Hepatic negative GI ROS, Neg liver ROS, neg GERD  ,  Endo/Other  negative endocrine ROS  Renal/GU Renal disease  negative genitourinary   Musculoskeletal  (+) Arthritis ,   Abdominal   Peds  Hematology negative hematology ROS (+)   Anesthesia Other Findings Past Medical History: No date: Cardiomyopathy (in setting of Afib)     Comment:  a. 12/2013 Echo: EF 45-50%, mild ant and antsept HK.               mild MR. Mod dil LA. nl RV fxn. Rhythm was Afib. No date: DJD (degenerative joint disease) of knee No date: History of kidney stones No date: Intervertebral disc disorder with radiculopathy of  lumbosacral region 2012: Kidney stone No date: Lower extremity edema No date: PAF (paroxysmal atrial fibrillation) ([facility]) No date: Pernicious anemia No date: Prostate cancer ([place]) No date: Pulmonary nodule, right     Comment:  a. 10/2014 Cardiac CTA: 22m RLL nodule; b. 04/2015 CT               Chest: stable 748mRLL nodule. No new nodules; 02/2017 CTA               Chest: stable, benign, 60m50mLL pulm nodule. No date: SVT (supraventricular tachycardia) [place] History: 12/23/2014: CARPAL TUNNEL RELEASE; Right     Comment:  Procedure: CARPAL  TUNNEL RELEASE;  Surgeon: HowEarnestine LeysD;  Location: [facility] ORS;  Service: Orthopedics;                Laterality: Right; 01/06/2015: CARPAL TUNNEL RELEASE; Left     Comment:  Procedure: CARPAL TUNNEL RELEASE;  Surgeon: HowEarnestine LeysD;  Location: [facility] ORS;  Service: Orthopedics;                Laterality: Left; 10/08/2017: COLONOSCOPY WITH PROPOFOL; N/A     Comment:  Procedure: COLONOSCOPY WITH PROPOFOL;  Surgeon: VanLin LandsmanD;  Location: [facility] ENDOSCOPY;  Service:               Gastroenterology;  Laterality: N/A; 2002: GASTRIC BYPASS No date: KNEE SURGERY     Comment:  knee trauma No date: prostate seeding     Reproductive/Obstetrics negative OB ROS                             Anesthesia Physical  Anesthesia Plan  ASA: IV  Anesthesia Plan: General   Post-op Pain Management:  Induction: Intravenous  PONV Risk Score and Plan: Ondansetron, Dexamethasone and Treatment may vary due to age or medical condition  Airway Management Planned: Oral ETT  Additional Equipment:   Intra-op Plan:   Post-operative Plan: Extubation in OR  Informed Consent: I have reviewed the patients History and Physical, chart, labs and discussed the procedure including the risks, benefits and alternatives for the proposed anesthesia with the patient or authorized representative who has indicated his/her understanding and acceptance.     Dental Advisory Given  Plan Discussed with: Anesthesiologist, CRNA and Surgeon  Anesthesia Plan Comments: (Patient consented for risks of anesthesia including but not limited to:  - adverse reactions to medications - risk of intubation if required - damage to eyes, teeth, lips or other oral mucosa - nerve damage due to positioning  - sore throat or hoarseness - Damage to heart, brain, nerves, lungs or loss of life  Patient voiced understanding.)        Anesthesia Quick  Evaluation

## 2021-09-27 NOTE — Evaluation (Signed)
Physical Therapy Evaluation Patient Details Name: [name] MRN: [id number] DOB: Apr 20, 1955 Today's Date: 09/27/2021  History of Present Illness  66 y/o male s/p R reverse total shoulder 9/12.  h/o sciatica pain for which he's had ablations/injections with continued pain and functional limitations.  Clinical Impression  Pt able to rise to standing, ambulate >100 ft and negotiate up/down steps with good relative confidence and safety.  He did have O2 in the low 90s with the effort and had increasing R sciatic pain (per her baseline).  Educated on the use of SPC, he states he has one but does not feel he needs it as long as he does not walk too far.  Pt voices good understanding of PROM/sling on R for a while - educated on HEP and strategies for in-home management.         Recommendations for follow up therapy are one component of a multi-disciplinary discharge planning process, led by the attending physician.  Recommendations may be updated based on patient status, additional functional criteria and insurance authorization.  Follow Up Recommendations Follow physician's recommendations for discharge plan and follow up therapies      Assistance Recommended at Discharge Frequent or constant Supervision/Assistance  Patient can return home with the following  Assistance with cooking/housework;Assist for transportation;A little help with bathing/dressing/bathroom    Equipment Recommendations None recommended by PT  Recommendations for Other Services       Functional Status Assessment Patient has had a recent decline in their functional status and demonstrates the ability to make significant improvements in function in a reasonable and predictable amount of time.     Precautions / Restrictions Precautions Precautions: Fall;Shoulder Type of Shoulder Precautions: total shoulder Shoulder Interventions: Shoulder sling/immobilizer Precaution Booklet Issued: Yes (comment) Required Braces or  Orthoses: Sling Restrictions Weight Bearing Restrictions: Yes RUE Weight Bearing: Non weight bearing      Mobility  Bed Mobility               General bed mobility comments: in recliner pre/post session    Transfers Overall transfer level: Modified independent Equipment used: None               General transfer comment: Pt able to rise to standing with relative ease from recliner    Ambulation/Gait Ambulation/Gait assistance: Supervision Gait Distance (Feet): 125 Feet Assistive device: None         General Gait Details: Pt able to walk with consistent and relatively symmetric gait.  Did c/o increasing sciatic aggravation and deferred more prolonged ambulation effort.  Stairs Stairs: Yes Stairs assistance: Supervision Stair Management: One rail Left Number of Stairs: 4 General stair comments: Pt able to negotiate up/down with step-to gait  Wheelchair Mobility    Modified Rankin (Stroke Patients Only)       Balance Overall balance assessment: Modified Independent                                           Pertinent Vitals/Pain Pain Assessment Pain Assessment: Faces Faces Pain Scale: Hurts little more Pain Location: sciatic pain    Home Living Family/patient expects to be discharged to:: Private residence Living Arrangements: Spouse/significant other Available Help at Discharge: Available 24 hours/day;Family   Home Access: Stairs to enter Entrance Stairs-Rails: Right;Left Entrance Stairs-Number of Steps: 3   Home Layout: One level Home Equipment: Cane - single point;Rollator (4 wheels)  Prior Function Prior Level of Function : Independent/Modified Independent             Mobility Comments: Pt reports she can be up ad lib in the home, limits stance time running errands and uses rollator as seat when sciatic pain limits him       Hand Dominance        Extremity/Trunk Assessment   Upper Extremity  Assessment Upper Extremity Assessment:  (R UE in immob, L WNL)    Lower Extremity Assessment Lower Extremity Assessment: Overall WFL for tasks assessed (R>L sciatic pain)       Communication   Communication: No difficulties  Cognition Arousal/Alertness: Awake/alert Behavior During Therapy: Restless Overall Cognitive Status: Within Functional Limits for tasks assessed                                          General Comments General comments (skin integrity, edema, etc.): Pt with good mobility but with limited standing tolerance.    Exercises     Assessment/Plan    PT Assessment Patient needs continued PT services  PT Problem List Decreased strength;Decreased activity tolerance;Decreased range of motion;Decreased mobility;Decreased knowledge of use of DME;Decreased safety awareness;Pain;Obesity;Decreased knowledge of precautions       PT Treatment Interventions DME instruction;Gait training;Stair training;Functional mobility training;Therapeutic activities;Therapeutic exercise;Balance training;Neuromuscular re-education;Patient/family education    PT Goals (Current goals can be found in the Care Plan section)  Acute Rehab PT Goals Patient Stated Goal: go home PT Goal Formulation: With patient Time For Goal Achievement: 10/10/21 Potential to Achieve Goals: Good    Frequency Min 2X/week     Co-evaluation               AM-PAC PT "6 Clicks" Mobility  Outcome Measure Help needed turning from your back to your side while in a flat bed without using bedrails?: A Little Help needed moving from lying on your back to sitting on the side of a flat bed without using bedrails?: A Little Help needed moving to and from a bed to a chair (including a wheelchair)?: None Help needed standing up from a chair using your arms (e.g., wheelchair or bedside chair)?: None Help needed to walk in hospital room?: None Help needed climbing 3-5 steps with a railing? :  None 6 Click Score: 22    End of Session   Activity Tolerance: Patient limited by pain (sciatic pain) Patient left: in chair;with call bell/phone within reach Nurse Communication: Mobility status PT Visit Diagnosis: Muscle weakness (generalized) (M62.81);Pain Pain - Right/Left: Right Pain - part of body: Shoulder    Time: 1450-1514 PT Time Calculation (min) (ACUTE ONLY): 24 min   Charges:   PT Evaluation $PT Eval Low Complexity: 1 Low PT Treatments $Therapeutic Exercise: 8-22 mins        Kreg Shropshire, DPT 09/27/2021, 4:10 PM

## 2021-09-27 NOTE — Anesthesia Procedure Notes (Signed)
Anesthesia Regional Block: Interscalene brachial plexus block   Pre-Anesthetic Checklist: , timeout performed,  Correct Patient, Correct Site, Correct Laterality,  Correct Procedure, Correct Position, site marked,  Risks and benefits discussed,  Surgical consent,  Pre-op evaluation,  At surgeon's request and post-op pain management  Laterality: Right and Upper  Prep: chloraprep       Needles:  Injection technique: Single-shot  Needle Type: Stimiplex     Needle Length: 5cm  Needle Gauge: 22     Additional Needles:   Procedures:,,,, ultrasound used (permanent image in chart),,    Narrative:  Start time: 09/27/2021 7:26 AM End time: 09/27/2021 7:28 AM Injection made incrementally with aspirations every 5 mL.  Performed by: Personally  Anesthesiologist: Martha Clan, MD  Additional Notes: Functioning IV was confirmed and monitors were applied.  A 67m 22ga Stimuplex needle was used. Sterile prep and drape,hand hygiene and sterile gloves were used.  Negative aspiration and negative test dose prior to incremental administration of local anesthetic. The patient tolerated the procedure well.

## 2021-09-27 NOTE — H&P (Signed)
Paper H&P to be scanned into permanent record. H&P reviewed. No significant changes noted.  

## 2021-09-28 ENCOUNTER — Encounter: Payer: Self-pay | Admitting: Orthopedic Surgery

## 2021-09-28 DIAGNOSIS — Z7982 Long term (current) use of aspirin: Secondary | ICD-10-CM | POA: Diagnosis not present

## 2021-09-28 DIAGNOSIS — Z87891 Personal history of nicotine dependence: Secondary | ICD-10-CM | POA: Diagnosis not present

## 2021-09-28 DIAGNOSIS — I480 Paroxysmal atrial fibrillation: Secondary | ICD-10-CM | POA: Diagnosis not present

## 2021-09-28 DIAGNOSIS — M19011 Primary osteoarthritis, right shoulder: Secondary | ICD-10-CM | POA: Diagnosis not present

## 2021-09-28 DIAGNOSIS — Z79899 Other long term (current) drug therapy: Secondary | ICD-10-CM | POA: Diagnosis not present

## 2021-09-28 DIAGNOSIS — Z7901 Long term (current) use of anticoagulants: Secondary | ICD-10-CM | POA: Diagnosis not present

## 2021-09-28 DIAGNOSIS — M75111 Incomplete rotator cuff tear or rupture of right shoulder, not specified as traumatic: Secondary | ICD-10-CM | POA: Diagnosis not present

## 2021-09-28 LAB — CBC
HCT: 32.8 % — ABNORMAL LOW (ref 39.0–52.0)
Hemoglobin: 10 g/dL — ABNORMAL LOW (ref 13.0–17.0)
MCH: 23.9 pg — ABNORMAL LOW (ref 26.0–34.0)
MCHC: 30.5 g/dL (ref 30.0–36.0)
MCV: 78.3 fL — ABNORMAL LOW (ref 80.0–100.0)
Platelets: 268 10*3/uL (ref 150–400)
RBC: 4.19 MIL/uL — ABNORMAL LOW (ref 4.22–5.81)
RDW: 15.5 % (ref 11.5–15.5)
WBC: 16.4 10*3/uL — ABNORMAL HIGH (ref 4.0–10.5)
nRBC: 0 % (ref 0.0–0.2)

## 2021-09-28 LAB — BASIC METABOLIC PANEL
Anion gap: 3 — ABNORMAL LOW (ref 5–15)
BUN: 16 mg/dL (ref 8–23)
CO2: 27 mmol/L (ref 22–32)
Calcium: 8.1 mg/dL — ABNORMAL LOW (ref 8.9–10.3)
Chloride: 111 mmol/L (ref 98–111)
Creatinine, Ser: 0.96 mg/dL (ref 0.61–1.24)
GFR, Estimated: >60 mL/min (ref >60)
Glucose, Bld: 104 mg/dL — ABNORMAL HIGH (ref 70–99)
Potassium: 4.3 mmol/L (ref 3.5–5.1)
Sodium: 141 mmol/L (ref 135–145)

## 2021-09-28 MED ORDER — OXYCODONE HCL 5 MG PO TABS: 5.0000 mg | ORAL_TABLET | ORAL | 0 refills | Status: DC | PRN

## 2021-09-28 MED ORDER — ASPIRIN 325 MG PO TBEC: 325.0000 mg | DELAYED_RELEASE_TABLET | Freq: Every day | ORAL | Status: AC

## 2021-09-28 MED ORDER — ONDANSETRON HCL 4 MG PO TABS: 4.0000 mg | ORAL_TABLET | Freq: Four times a day (QID) | ORAL | 0 refills | Status: DC | PRN

## 2021-09-28 NOTE — Progress Notes (Signed)
PT Contact Note  Patient Details Name: TERRAN KLINKE MRN: [id number] DOB: March 16, 1955   Cancelled Treatment:     Discussed with pt and wife pros and cons of different DME to address chronic sciatic pain.  Educated on hemi-walker, quad cane and straight cane.  Ultimately pt states that he has a straight cane that is appropriate for his weight at home and feels confident in ability to use it as needed.  Questions answered, no further PT needs at this time, ready for d/c.    Kreg Shropshire, DPT 09/28/2021, 11:00 AM

## 2021-09-28 NOTE — Plan of Care (Signed)
  Problem: Education: Goal: Knowledge of the prescribed therapeutic regimen will improve Outcome: Progressing Goal: Understanding of activity limitations/precautions following surgery will improve Outcome: Progressing Goal: Individualized Educational Video(s) Outcome: Progressing   Problem: Activity: Goal: Ability to tolerate increased activity will improve Outcome: Progressing   Problem: Pain Management: Goal: Pain level will decrease with appropriate interventions Outcome: Progressing   Problem: Education: Goal: Knowledge of General Education information will improve Description: Including pain rating scale, medication(s)/side effects and non-pharmacologic comfort measures Outcome: Progressing   Problem: Health Behavior/Discharge Planning: Goal: Ability to manage health-related needs will improve Outcome: Progressing   Problem: Clinical Measurements: Goal: Ability to maintain clinical measurements within normal limits will improve Outcome: Progressing Goal: Will remain free from infection Outcome: Progressing Goal: Diagnostic test results will improve Outcome: Progressing Goal: Respiratory complications will improve Outcome: Progressing Goal: Cardiovascular complication will be avoided Outcome: Progressing   Problem: Activity: Goal: Risk for activity intolerance will decrease Outcome: Progressing   Problem: Nutrition: Goal: Adequate nutrition will be maintained Outcome: Progressing   Problem: Coping: Goal: Level of anxiety will decrease Outcome: Progressing   Problem: Elimination: Goal: Will not experience complications related to bowel motility Outcome: Progressing Goal: Will not experience complications related to urinary retention Outcome: Progressing   Problem: Pain Managment: Goal: General experience of comfort will improve Outcome: Progressing   Problem: Safety: Goal: Ability to remain free from injury will improve Outcome: Progressing   Problem:  Skin Integrity: Goal: Risk for impaired skin integrity will decrease Outcome: Progressing   Problem: Education: Goal: Knowledge of General Education information will improve Description: Including pain rating scale, medication(s)/side effects and non-pharmacologic comfort measures Outcome: Progressing   Problem: Health Behavior/Discharge Planning: Goal: Ability to manage health-related needs will improve Outcome: Progressing   Problem: Clinical Measurements: Goal: Ability to maintain clinical measurements within normal limits will improve Outcome: Progressing Goal: Will remain free from infection Outcome: Progressing Goal: Diagnostic test results will improve Outcome: Progressing Goal: Respiratory complications will improve Outcome: Progressing Goal: Cardiovascular complication will be avoided Outcome: Progressing   Problem: Activity: Goal: Risk for activity intolerance will decrease Outcome: Progressing   Problem: Nutrition: Goal: Adequate nutrition will be maintained Outcome: Progressing   Problem: Coping: Goal: Level of anxiety will decrease Outcome: Progressing   Problem: Elimination: Goal: Will not experience complications related to bowel motility Outcome: Progressing Goal: Will not experience complications related to urinary retention Outcome: Progressing   Problem: Pain Managment: Goal: General experience of comfort will improve Outcome: Progressing   Problem: Safety: Goal: Ability to remain free from injury will improve Outcome: Progressing   Problem: Skin Integrity: Goal: Risk for impaired skin integrity will decrease Outcome: Progressing

## 2021-09-28 NOTE — Progress Notes (Signed)
DISCHARGE NOTE:  Pt given discharge instructions and scripts. Pt verbalized understanding. Honeycomb dressing changed, clean dry and intact. Extra sent. Polar care and belongings sent with pt. Pt wheeled to car by staff, wife providing transportation.

## 2021-09-28 NOTE — Plan of Care (Signed)
  Problem: Education: Goal: Knowledge of the prescribed therapeutic regimen will improve Outcome: Adequate for Discharge Goal: Understanding of activity limitations/precautions following surgery will improve Outcome: Adequate for Discharge Goal: Individualized Educational Video(s) Outcome: Adequate for Discharge   Problem: Activity: Goal: Ability to tolerate increased activity will improve Outcome: Adequate for Discharge   Problem: Pain Management: Goal: Pain level will decrease with appropriate interventions Outcome: Adequate for Discharge   Problem: Education: Goal: Knowledge of General Education information will improve Description: Including pain rating scale, medication(s)/side effects and non-pharmacologic comfort measures Outcome: Adequate for Discharge   Problem: Health Behavior/Discharge Planning: Goal: Ability to manage health-related needs will improve Outcome: Adequate for Discharge   Problem: Clinical Measurements: Goal: Ability to maintain clinical measurements within normal limits will improve Outcome: Adequate for Discharge Goal: Will remain free from infection Outcome: Adequate for Discharge Goal: Diagnostic test results will improve Outcome: Adequate for Discharge Goal: Respiratory complications will improve Outcome: Adequate for Discharge Goal: Cardiovascular complication will be avoided Outcome: Adequate for Discharge   Problem: Activity: Goal: Risk for activity intolerance will decrease Outcome: Adequate for Discharge   Problem: Nutrition: Goal: Adequate nutrition will be maintained Outcome: Adequate for Discharge   Problem: Coping: Goal: Level of anxiety will decrease Outcome: Adequate for Discharge   Problem: Elimination: Goal: Will not experience complications related to bowel motility Outcome: Adequate for Discharge Goal: Will not experience complications related to urinary retention Outcome: Adequate for Discharge   Problem: Pain  Managment: Goal: General experience of comfort will improve Outcome: Adequate for Discharge   Problem: Safety: Goal: Ability to remain free from injury will improve Outcome: Adequate for Discharge   Problem: Skin Integrity: Goal: Risk for impaired skin integrity will decrease Outcome: Adequate for Discharge   Problem: Education: Goal: Knowledge of General Education information will improve Description: Including pain rating scale, medication(s)/side effects and non-pharmacologic comfort measures Outcome: Adequate for Discharge   Problem: Health Behavior/Discharge Planning: Goal: Ability to manage health-related needs will improve Outcome: Adequate for Discharge   Problem: Clinical Measurements: Goal: Ability to maintain clinical measurements within normal limits will improve Outcome: Adequate for Discharge Goal: Will remain free from infection Outcome: Adequate for Discharge Goal: Diagnostic test results will improve Outcome: Adequate for Discharge Goal: Respiratory complications will improve Outcome: Adequate for Discharge Goal: Cardiovascular complication will be avoided Outcome: Adequate for Discharge   Problem: Activity: Goal: Risk for activity intolerance will decrease Outcome: Adequate for Discharge   Problem: Nutrition: Goal: Adequate nutrition will be maintained Outcome: Adequate for Discharge   Problem: Coping: Goal: Level of anxiety will decrease Outcome: Adequate for Discharge   Problem: Elimination: Goal: Will not experience complications related to bowel motility Outcome: Adequate for Discharge Goal: Will not experience complications related to urinary retention Outcome: Adequate for Discharge   Problem: Pain Managment: Goal: General experience of comfort will improve Outcome: Adequate for Discharge   Problem: Safety: Goal: Ability to remain free from injury will improve Outcome: Adequate for Discharge   Problem: Skin Integrity: Goal: Risk for  impaired skin integrity will decrease Outcome: Adequate for Discharge

## 2021-09-28 NOTE — Discharge Instructions (Signed)
Wayne H. Patel, MD  [facility]  Phone: [phone]  Fax: [phone]   Discharge Instructions after Reverse Shoulder Replacement    1. Activity/Sling: You are to be non-weight bearing on operative extremity. A sling/shoulder immobilizer has been provided for you. Only remove the sling to perform elbow, wrist, and hand RoM exercises and hygiene/dressing. Active reaching and lifting are not permitted. You will be given further instructions on sling use at your first physical therapy visit and postoperative visit with Dr. Patel.   2. Dressings: Dressing may be removed at 1st physical therapy visit (~3-4 days after surgery). Afterwards, you may either leave open to air (if no drainage) or cover with dry, sterile dressing. If you have steri-strips on your wound, please do not remove them. They will fall off on their own. You may shower 5 days after surgery. Please pat incision dry. Do not rub or place any shear forces across incision. If there is drainage or any opening of incision after 5 days, please notify our offices immediately.    3. Driving:  Plan on not driving for six weeks. Please note that you are advised NOT to drive while taking narcotic pain medications as you may be impaired and unsafe to drive.   4. Medications:  - You have been provided a prescription for narcotic pain medicine (usually oxycodone). After surgery, take 1-2 narcotic tablets every 4 hours if needed for severe pain. Please start this as soon as you begin to start having pain (if you received a nerve block, start taking as soon as this wears off).  - A prescription for anti-nausea medication will be provided in case the narcotic medicine causes nausea - take 1 tablet every 6 hours only if nauseated.  - Take enteric coated aspirin 325 mg once daily for 6 weeks to prevent blood clots. Do not take aspirin if you have an aspirin sensitivity/allergy or asthma or are on an anticoagulant (blood thinner) already. If so, then  your home anticoagulant will be resume and managed - do not take aspirin. -Take tylenol 1000mg (2 Extra strength or 3 regular strength tablets) every 8 hours for pain. This will reduce the amount of narcotic medication needed. May stop tylenol when you are having minimal pain. - Take a stool softener (Colace, Dulcolax or Senakot) if you are using narcotic pain medications to help with constipation that is associated with narcotic use. - DO NOT take ANY nonsteroidal anti-inflammatory pain medications: Advil, Motrin, Ibuprofen, Aleve, Naproxen, or Naprosyn.   If you are taking prescription medication for anxiety, depression, insomnia, muscle spasm, chronic pain, or for attention deficit disorder you are advised that you are at a higher risk of adverse effects with use of narcotics post-op, including narcotic addiction/dependence, depressed breathing, death. If you use non-prescribed substances: alcohol, marijuana, cocaine, heroin, methamphetamines, etc., you are at a higher risk of adverse effects with use of narcotics post-op, including narcotic addiction/dependence, depressed breathing, death. You are advised that taking > 50 morphine milligram equivalents (MME) of narcotic pain medication per day results in twice the risk of overdose or death. For your prescription provided: oxycodone 5 mg - taking more than 6 tablets per day after the first few days of surgery.   5. Physical Therapy: 1-2 times per week for ~12 weeks. Therapy typically starts on post operative Day 3 or 4. You have been provided an order for physical therapy. The therapist will provide home exercises. Please contact our offices if this appointment has not been scheduled.      6. Work: May do light duty/desk job in approximately 2 weeks when off of narcotics, pain is well-controlled, and swelling has decreased if able to function with one arm in sling. Full work may take 6 weeks if light motions and function of both arms is required.  Lifting jobs may require 12 weeks.   7. Post-Op Appointments: Your first post-op appointment will be with Dr. Patel in approximately 2 weeks time.    If you find that they have not been scheduled please call the Orthopaedic Appointment front desk at [phone].                               Wayne H. Patel, MD [facility] Phone: [phone] Fax: [phone]   REVERSE SHOULDER ARTHROPLASTY REHAB GUIDELINES   These guidelines should be tailored to individual patients based on their rehab goals, age, precautions, quality of repair, etc.  Progression should be based on patient progress and approval by the referring physician.  PHASE 1 - Day 1 through Week 2  GENERAL GUIDELINES AND PRECAUTIONS Sling wear 24/7 except during grooming and home exercises (3 to 5 times daily) Avoid shoulder extension such that the arm is posterior the frontal plane.  When patients recline, a pillow should be placed behind the upper arm and sling should be on.  They should be advised to always be able to see the elbow Avoid combined IR/ADD/EXT, such as hand behind back to prevent dislocation Avoid combined IR and ADD such as reaching across the chest to prevent dislocation No AROM No submersion in pool/water for 4 weeks No weight bearing through operative arm (as in transfers, walker use, etc.)  GOALS Maintain integrity of joint replacement; protect soft tissue healing Increase PROM for elevation to 120 and ER to 30 (will remain the goal for first 6 weeks) Optimize distal UE circulation and muscle activity (elbow, wrist and hand) Instruct in use of sling for proper fit, polar care device for ice application after HEP, signs/symptoms of infection  EXERCISES Active elbow, wrist and hand Passive forward elevation in scapular plane to 90-120 max motion; ER in scapular plane to 30 Active scapular retraction with arms resting in neutral position  CRITERIA TO PROGRESS TO  PHASE 2 Low pain (less than 3/10) with shoulder PROM Healing of incision without signs of infection Clearance by MD to advance after 2 week MD check up  PHASE 2 - 2 weeks - 6 weeks  GENERAL GUIDELINES AND PRECAUTIONS Sling may be removed while at home; worn in community without abduction pillow May use arm for light activities of daily living (such as feeding, brushing teeth, dressing.) with elbow near  the side of the body  and arm in front of the body- no active lifting of the arm May submerge in water (tub, pool, Jacuzzi, etc.) after 4 weeks Continue to avoid WBing through the operative arm Continue to avoid combined IR/EXT/ADD (hand behind the back) and IR/ADD  (reaching across chest) for dislocation precautions  GOALS  Achieve passive elevation to 120 and ER to 30  Low (less than 3/10) to no pain  Ability to fire all heads of the deltoid  EXERCISES May discontinue grip, and active elbow and wrist exercises since using the arm in ADL's  with sling removed around the home Continue passive elevation to 120 and ER to 30, both in scapular plane with arm supported on table top Add submaximal isometrics, pain free effort, for all   functional heads of deltoid (anterior, posterior, middle)  Ensure that with posterior deltoid isometric the shoulder does not move into extension and the arm remains anterior the frontal plane At 4 weeks:  begin to place arm in balanced position of 90 deg elevation in supine; when patient able to hold this position with ease, may begin reverse pendulums clockwise and counterclockwise  CRITERIA TO PROGRESS TO PHASE 3 Passive forward elevation in scapular plane to 120; passive ER in scapular plane to 30 Ability to fire isometrically all heads of the deltoid muscle without pain Ability to place and hold the arm in balanced position (90 deg elevation in supine)  PHASE 3 - 6 weeks to 3 months  GENERAL GUIDELINES AND PRECAUTIONS Discontinue use of sling Avoid  forcing end range motion in any direction to prevent dislocation  May advance use of the arm actively in ADL's without being restricted to arm by the side of the body, however, avoid heavy lifting and sports (forever!) May initiate functional IR behind the back gently NO UPPER BODY ERGOMETER   GOALS Optimize PROM for elevation and ER in scapular plane with realistic expectation that max  mobility for elevation is usually around 145-160 passively; ER 40 to 50 passively; functional IR to L1 Recover AROM to approach as close to PROM available as possible; may expect 135-150 deg active elevation; 30 deg active ER; active functional IR to L1 Establish dynamic stability of the shoulder with deltoid and periscapular muscle gradual strengthening  EXERCISES Forward elevation in scapular plane active progression: supine to incline, to vertical; short to long lever arm Balanced position long lever arm AROM Active ER/IR with arm at side Scapular retraction with light band resistance Functional IR with hand slide up back - very gentle and gradual NO UPPER BODY ERGOMETER     CRITERIA TO PROGRESS TO PHASE 4  AROM equals/approaches PROM with good mechanics for elevation   No pain  Higher level demand on shoulder than ADL functions   PHASE 4 12 months and beyond  GENERAL GUIDELINES AND PRECAUTIONS No heavy lifting and no overhead sports No heavy pushing activity Gradually increase strength of deltoid and scapular stabilizers; also the rotator cuff if present with weights not to exceed 5 lbs NO UPPER BODY ERGOMETER   GOALS  Optimize functional use of the operative UE to meet the desired demands  Gradual increase in deltoid, scapular muscle, and rotator cuff strength  Pain free functional activities   EXERCISES Add light hand weights for deltoid up to and not to exceed 3 lbs for anterior and posterior with long arm lift against gravity; elbow bent to 90 deg for abduction in scapular  plane Theraband progression for extension to hip with scapular depression/retraction Theraband progression for serratus anterior punches in supine; avoid wall, incline or prone pressups for serratus anterior End range stretching gently without forceful overpressure in all planes (elevation in scapular plane, ER in scapular plane, functional IR) with stretching done for life as part of a daily routine NO UPPER BODY ERGOMETER     CRITERIA FOR DISCHARGE FROM SKILLED PHYSICAL THERAPY  Pain free AROM for shoulder elevation (expect around 135-150)  Functional strength for all ADL's, work tasks, and hobbies approved by surgeon  Independence with home maintenance program   NOTES: 1. With proper exercise, motion, strength, and function continue to improve even after one year. 2. The complication rate after surgery is 5 - 8%. Complications include infection, fracture, heterotopic bone formation, nerve injury, instability, rotator cuff   tear, and tuberosity nonunion. Please look for clinical signs, unusual symptoms, or lack of progress with therapy and report those to Dr. Patel. Prefer more communication than less.  3. The therapy plan above only serves as a guide. Please be aware of specific individualized patient instructions as written on the prescription or through discussions with the surgeon. 4. Please call Dr. Patel if you have any specific questions or concerns [phone]    

## 2021-09-28 NOTE — TOC Progression Note (Signed)
Transition of Care [facility] - Progression Note    Patient Details  Name: [name] MRN: [id number] Date of Birth: Aug 28, 1955  Transition of Care [facility]) CM/SW [place], RN Phone Number: 09/28/2021, 10:02 AM  Clinical Narrative:    When [employer] came to deliver the [facility], PT cancelled the order stating that the patient did not need it, [employer] did not deliver the Hemi walker   Expected Discharge Plan: OP Rehab Barriers to Discharge: Barriers Resolved  Expected Discharge Plan and Services Expected Discharge Plan: OP Rehab   Discharge Planning Services: CM Consult   Living arrangements for the past 2 months: Single Family Home Expected Discharge Date: 09/28/21               DME Arranged: Gilford Rile hemi DME Agency: [employer] Date DME Agency Contacted: 09/28/21 Time DME Agency Contacted: [phone] Representative spoke with at DME Agency: Lou Cal Arranged: NA           Social Determinants of Health ([place]) Interventions    Readmission Risk Interventions     No data to display

## 2021-09-28 NOTE — TOC Progression Note (Signed)
Transition of Care [facility]) - Progression Note    Patient Details  Name: [name] MRN: [id number] Date of Birth: [birth year]  Transition of Care [facility]) CM/SW [place], RN Phone Number: 09/28/2021, 8:25 AM  Clinical Narrative:     Spoke with the patient, reviewed code 67 He lives at home with his wife and she will provide transportation He needs a Hemi walker, [employer] to deliver to the bedside prior to DC  He has an outpatient pT appointment  for Friday  Expected Discharge Plan: OP Rehab Barriers to Discharge: Barriers Resolved  Expected Discharge Plan and Services Expected Discharge Plan: OP Rehab   Discharge Planning Services: CM Consult   Living arrangements for the past 2 months: Single Family Home Expected Discharge Date: 09/28/21               DME Arranged: Gilford Rile hemi DME Agency: [employer] Date DME Agency Contacted: 09/28/21 Time DME Agency Contacted: [phone] Representative spoke with at DME Agency: Lou Cal Arranged: NA           Social Determinants of Health (SDOH) Interventions    Readmission Risk Interventions     No data to display

## 2021-09-28 NOTE — Progress Notes (Signed)
Subjective: 1 Day Post-Op Procedure(s) (LRB): Right reverse shoulder arthroplasty, biceps tenodesis (Right) Right reverse shoulder arthroplasty, biceps tenodesis (Right) Patient reports pain as mild.   Patient is well, and has had no acute complaints or problems Plan is to go Home after hospital stay. Negative for chest pain and shortness of breath Fever: no Gastrointestinal: Negative for nausea and vomiting  Objective: Vital signs in last 24 hours: Temp:  [97.6 F (36.4 C)-98.9 F (37.2 C)] 98.1 F (36.7 C) (09/13 0718) Pulse Rate:  [55-83] 55 (09/13 0718) Resp:  [16-22] 19 (09/13 0718) BP: (99-151)/(54-80) 133/70 (09/13 0718) SpO2:  [89 %-97 %] 94 % (09/13 0718)  Intake/Output from previous day:  Intake/Output Summary (Last 24 hours) at 09/28/2021 0729 Last data filed at 09/28/2021 0307 Gross per 24 hour  Intake 3319.47 ml  Output 140 ml  Net 3179.47 ml    Intake/Output this shift: No intake/output data recorded.  Labs: Recent Labs    09/28/21 0453  HGB 10.0*   Recent Labs    09/28/21 0453  WBC 16.4*  RBC 4.19*  HCT 32.8*  PLT 268   Recent Labs    09/28/21 0453  NA 141  K 4.3  CL 111  CO2 27  BUN 16  CREATININE 0.96  GLUCOSE 104*  CALCIUM 8.1*   No results for input(s): "LABPT", "INR" in the last 72 hours.   EXAM General - Patient is Alert and Oriented Extremity - Neurovascular intact Dorsiflexion/Plantar flexion intact Compartment soft with sensation slowly returning. Dressing/Incision - clean, dry, blood tinged drainage, with the Hemovac removed with no complication Motor Function - intact, moving fingers and wrist well on exam.   Past Medical History:  Diagnosis Date   Aortic atherosclerosis ([place])    Cardiomyopathy (in setting of Afib)    a.) TTE 12/26/2013: EF 45-50%, mild ant and antsept HK. mild MR. Mod dil LA. nl RV fxn. Rhythm was Afib; b.) TTE 07/04/2019: EF 55%, mid-apical anteroseptal HK, mild MAC, mild Ao sclerosis, G2DD, RVSP  45.3   Chronic pain syndrome    a.) followed by pain management   Chronic, continuous use of opioids    a.) hydrocodone/APAP 7.5/325 mg; followed by pain management   Coronary artery disease 11/06/2014   a.) cCTA 11/06/2014: Ca score 224 (all in pLAD) -- 74th percentile for age/sex matched control   Diverticulosis    DJD (degenerative joint disease) of knee    History of hiatal hernia    History of kidney stones 2012   Hyperlipidemia    Hyperplastic colon polyp    Hypertension    Internal hemorrhoids    Intervertebral disc disorder with radiculopathy of lumbosacral region    Long term current use FULL DOSE (325 mg) aspirin    Long term current use of amiodarone    Long term current use of antithrombotics/antiplatelets    a.) rivaroxaban   Lower extremity edema    Mixed hyperlipidemia    Myalgia due to statin    Osteoarthritis    PAF (paroxysmal atrial fibrillation) ([place])    a.) CHA2DS2VASc = 4 (age, HTN, CHF, vascular disease history);  b.) s/p DCCV 03/13/2014 (200 J x1); c.) s/p DCCV 05/15/2019 (150 J x 1, 200 J x2); d.) rate/rhythm maintained on oral amiodarone + metoprolol succinate; chronically anticoagulated with rivaroxaban   Pernicious anemia    Prostate cancer ([place])    Pulmonary nodule, right    a. 10/2014 Cardiac CTA: 105m RLL nodule; b. 04/2015 CT Chest: stable 770mRLL  nodule. No new nodules; 02/2017 CTA Chest: stable, benign, 17m RLL pulm nodule.   Sleep difficulties    a.) takes melatonin + trazodone PRN   SVT (supraventricular tachycardia) ([facility])    Tubular adenoma of colon     Assessment/Plan: 1 Day Post-Op Procedure(s) (LRB): Right reverse shoulder arthroplasty, biceps tenodesis (Right) Right reverse shoulder arthroplasty, biceps tenodesis (Right) Principal Problem:   S/p reverse total shoulder arthroplasty  Estimated body mass index is 42.52 kg/m as calculated from the following:   Height as of this encounter: 6' (1.829 m).   Weight as of this encounter:  142.2 kg. Advance diet Up with therapy D/C IV fluids  Discharge planning.  Discharge home after physical therapy.  [facility].  DVT Prophylaxis - Aspirin Right arm in the shoulder immobilizer at all times except for bathing and physical therapy.  TReche Dixon PA-C Orthopaedic Surgery 09/28/2021, 7:29 AM

## 2021-09-28 NOTE — Discharge Summary (Signed)
Physician Discharge Summary  Subjective: 1 Day Post-Op Procedure(s) (LRB): Right reverse shoulder arthroplasty, biceps tenodesis (Right) Right reverse shoulder arthroplasty, biceps tenodesis (Right) Patient reports pain as mild.   Patient seen in rounds with Dr. Posey Pronto. Patient is well, and has had no acute complaints or problems Patient is ready to go home after physical therapy  Physician Discharge Summary  Patient ID: [name] MRN: [id number] DOB/AGE: Jun 02, 1955 66 y.o.  Admit date: 09/27/2021 Discharge date: 09/28/2021  Admission Diagnoses:  Discharge Diagnoses:  Principal Problem:   S/p reverse total shoulder arthroplasty   Discharged Condition: fair  Hospital Course: The patient is postop day 1 from a right reverse shoulder replacement.  He is doing well with pain management.  His vitals have remained stable.  Treatments: surgery:  1. Right reverse total shoulder arthroplasty 2. Right biceps tenodesis   SURGEON: Cato Mulligan, MD   ASSISTANTS: Evette Georges, PA-S    ANESTHESIA: Gen + interscalene block   ESTIMATED BLOOD LOSS:150cc   TOTAL IV FLUIDS: per anesthesia record   IMPLANTS: [employer] Surgical: RSP Glenoid Head w/Retaining screw 36N; Monoblock Reverse Shoulder Baseplate with 9.2JJ central screw; 4 locking screws into baseplate; Standard Shell Short Humeral Stem 18 x 107m; Neutral Small Socket Insert   Discharge Exam: Blood pressure 133/70, pulse (!) 55, temperature 98.1 F (36.7 C), resp. rate 19, height 6' (1.829 m), weight (!) 142.2 kg, SpO2 94 %.   Disposition: Discharge disposition: 01-Home or Self Care        Allergies as of 09/28/2021   No Known Allergies      Medication List     STOP taking these medications    Aspirin 325 MG Caps Replaced by: aspirin EC 325 MG tablet       TAKE these medications    amiodarone 200 MG tablet Commonly known as: PACERONE TAKE ONE TABLET BY MOUTH ONE TIME DAILY What changed: when to take  this   amLODipine 5 MG tablet Commonly known as: NORVASC Take 1 tablet (5 mg total) by mouth daily. What changed: when to take this   aspirin EC 325 MG tablet Take 1 tablet (325 mg total) by mouth daily. Replaces: Aspirin 325 MG Caps   benazepril 40 MG tablet Commonly known as: LOTENSIN TAKE ONE TABLET BY MOUTH ONE TIME DAILY What changed:  how much to take how to take this when to take this   busPIRone 10 MG tablet Commonly known as: BUSPAR Take 1 tablet (10 mg total) by mouth 4 (four) times daily as needed (anxiety). What changed: when to take this   carboxymethylcellulose 0.5 % Soln Commonly known as: REFRESH PLUS 1 drop 3 (three) times daily as needed.   cyanocobalamin 1000 MCG/ML injection Commonly known as: VITAMIN B12 Inject 1 mL (1,000 mcg total) into the skin every 30 (thirty) days. INJECT 1ML EVERY 30 DAYS AS DIRECTED   cyclobenzaprine 10 MG tablet Commonly known as: FLEXERIL Take 10 mg by mouth every evening.   DULoxetine 30 MG capsule Commonly known as: Cymbalta Take 1 capsule (30 mg total) by mouth daily. What changed: when to take this   furosemide 20 MG tablet Commonly known as: LASIX Take 1 tablet (20 mg total) by mouth daily as needed.   HYDROcodone-acetaminophen 7.5-325 MG tablet Commonly known as: Norco Take 1 tablet by mouth every 6 (six) hours as needed for severe pain. Must last 30 days Start taking on: October 22, 2021 What changed: Another medication with the same name was removed.  Continue taking this medication, and follow the directions you see here.   ibuprofen 200 MG tablet Commonly known as: ADVIL Take 400 mg by mouth 2 (two) times daily as needed for moderate pain.   melatonin 3 MG Tabs tablet Take 3 mg by mouth at bedtime.   metoprolol succinate 50 MG 24 hr tablet Commonly known as: TOPROL-XL Take 1 tablet (50 mg total) by mouth daily. TAKE 1 TABLET(50 MG) BY MOUTH DAILY WITH OR IMMEDIATELY FOLLOWING A MEAL What changed: when  to take this   multivitamin with minerals Tabs tablet Take 1 tablet by mouth daily.   ondansetron 4 MG tablet Commonly known as: ZOFRAN Take 1 tablet (4 mg total) by mouth every 6 (six) hours as needed for nausea.   oxyCODONE 5 MG immediate release tablet Commonly known as: Oxy IR/ROXICODONE Take 1-2 tablets (5-10 mg total) by mouth every 4 (four) hours as needed for moderate pain (pain score 4-6).   traZODone 150 MG tablet Commonly known as: DESYREL Take 1 tablet (150 mg total) by mouth at bedtime.   Xarelto 20 MG Tabs tablet Generic drug: rivaroxaban TAKE ONE TABLET BY MOUTH ONE TIME DAILY WITH SUPPER What changed: See the new instructions.        Follow-up Information     Leim Fabry, MD. Go in 2 week(s).   Specialty: Orthopedic Surgery Why: For staple removal and x-rays Contact information: [place] [address] [phone]                 Signed: Prescott Parma, Romaine Neville 09/28/2021, 7:37 AM   Objective: Vital signs in last 24 hours: Temp:  [97.6 F (36.4 C)-98.9 F (37.2 C)] 98.1 F (36.7 C) (09/13 0718) Pulse Rate:  [55-83] 55 (09/13 0718) Resp:  [16-22] 19 (09/13 0718) BP: (99-151)/(54-80) 133/70 (09/13 0718) SpO2:  [89 %-97 %] 94 % (09/13 0718)  Intake/Output from previous day:  Intake/Output Summary (Last 24 hours) at 09/28/2021 0737 Last data filed at 09/28/2021 0307 Gross per 24 hour  Intake 3219.47 ml  Output 140 ml  Net 3079.47 ml    Intake/Output this shift: No intake/output data recorded.  Labs: Recent Labs    09/28/21 0453  HGB 10.0*   Recent Labs    09/28/21 0453  WBC 16.4*  RBC 4.19*  HCT 32.8*  PLT 268   Recent Labs    09/28/21 0453  NA 141  K 4.3  CL 111  CO2 27  BUN 16  CREATININE 0.96  GLUCOSE 104*  CALCIUM 8.1*   No results for input(s): "LABPT", "INR" in the last 72 hours.  EXAM: General - Patient is Alert and Oriented Extremity - Neurovascular intact Sensation intact  distally Dorsiflexion/Plantar flexion intact Incision - clean, dry, blood tinged drainage, with the Hemovac drain removed. Motor Function -grip strength intact.  Dorsiflexion and volar flexion of the fingers intact.  Assessment/Plan: 1 Day Post-Op Procedure(s) (LRB): Right reverse shoulder arthroplasty, biceps tenodesis (Right) Right reverse shoulder arthroplasty, biceps tenodesis (Right) Procedure(s) (LRB): Right reverse shoulder arthroplasty, biceps tenodesis (Right) Right reverse shoulder arthroplasty, biceps tenodesis (Right) Past Medical History:  Diagnosis Date   Aortic atherosclerosis ([place])    Cardiomyopathy (in setting of Afib)    a.) TTE 12/26/2013: EF 45-50%, mild ant and antsept HK. mild MR. Mod dil LA. nl RV fxn. Rhythm was Afib; b.) TTE 07/04/2019: EF 55%, mid-apical anteroseptal HK, mild MAC, mild Ao sclerosis, G2DD, RVSP 45.3   Chronic pain syndrome    a.) followed  by pain management   Chronic, continuous use of opioids    a.) hydrocodone/APAP 7.5/325 mg; followed by pain management   Coronary artery disease 11/06/2014   a.) cCTA 11/06/2014: Ca score 224 (all in pLAD) -- 74th percentile for age/sex matched control   Diverticulosis    DJD (degenerative joint disease) of knee    History of hiatal hernia    History of kidney stones 2012   Hyperlipidemia    Hyperplastic colon polyp    Hypertension    Internal hemorrhoids    Intervertebral disc disorder with radiculopathy of lumbosacral region    Long term current use FULL DOSE (325 mg) aspirin    Long term current use of amiodarone    Long term current use of antithrombotics/antiplatelets    a.) rivaroxaban   Lower extremity edema    Mixed hyperlipidemia    Myalgia due to statin    Osteoarthritis    PAF (paroxysmal atrial fibrillation) ([place])    a.) CHA2DS2VASc = 4 (age, HTN, CHF, vascular disease history);  b.) s/p DCCV 03/13/2014 (200 J x1); c.) s/p DCCV 05/15/2019 (150 J x 1, 200 J x2); d.) rate/rhythm maintained  on oral amiodarone + metoprolol succinate; chronically anticoagulated with rivaroxaban   Pernicious anemia    Prostate cancer ([place])    Pulmonary nodule, right    a. 10/2014 Cardiac CTA: 3m RLL nodule; b. 04/2015 CT Chest: stable 763mRLL nodule. No new nodules; 02/2017 CTA Chest: stable, benign, 12m36mLL pulm nodule.   Sleep difficulties    a.) takes melatonin + trazodone PRN   SVT (supraventricular tachycardia) ([facility])    Tubular adenoma of colon    Principal Problem:   S/p reverse total shoulder arthroplasty  Estimated body mass index is 42.52 kg/m as calculated from the following:   Height as of this encounter: 6' (1.829 m).   Weight as of this encounter: 142.2 kg. Advance diet Up with therapy D/C IV fluids Diet - Regular diet Follow up - in 2 weeks Activity -with the shoulder immobilizer on at all times except for bathing and physical therapy. Disposition - Home Condition Upon Discharge - Stable DVT Prophylaxis - Aspirin and XarSaladoA-C Orthopaedic Surgery 09/28/2021, 7:37 AM

## 2021-09-28 NOTE — Evaluation (Signed)
Occupational Therapy Evaluation Patient Details Name: [name] MRN: [id number] DOB: [birth year] Today's Date: 09/28/2021   History of Present Illness 66 y/o male s/p R reverse total shoulder 9/12.  h/o sciatica pain for which he's had ablations/injections with continued pain and functional limitations.   Clinical Impression   Upon entering the room, pt supine in bed with wife present in room and agreeable to OT intervention. Pt is pleasant and cooperative throughout. Pt reports being independent at baseline and living at home with wife. Pt performed bed mobility without assistance. Pt donning LB clothing items with min A for sit <>stand and assistance to pull over B hips. Pt educated on precautions, shoulder sling, ADL techniques, and hand/wrist/elbow exercises. Wife able to demonstrate understanding by assisting pt with donning sling and polar care system. RN also present during session to change honeycomb dressing prior to getting dressed. Pt and wife asking questions and once all answered they were provided with paper handout as well. No further acute OT need at this time. OT to complete order.      Recommendations for follow up therapy are one component of a multi-disciplinary discharge planning process, led by the attending physician.  Recommendations may be updated based on patient status, additional functional criteria and insurance authorization.   Follow Up Recommendations  Follow physician's recommendations for discharge plan and follow up therapies    Assistance Recommended at Discharge Intermittent Supervision/Assistance  Patient can return home with the following A little help with walking and/or transfers;A little help with bathing/dressing/bathroom;Help with stairs or ramp for entrance;Assist for transportation       Equipment Recommendations  None recommended by OT       Precautions / Restrictions Precautions Precautions: Fall;Shoulder Type of Shoulder Precautions:  total shoulder Shoulder Interventions: Shoulder abduction pillow;Don joy ultra sling Required Braces or Orthoses: Sling Restrictions Weight Bearing Restrictions: Yes RUE Weight Bearing: Non weight bearing      Mobility Bed Mobility Overal bed mobility: Modified Independent                            Balance Overall balance assessment: Modified Independent                                         ADL either performed or assessed with clinical judgement   ADL Overall ADL's : Needs assistance/impaired                 Upper Body Dressing : Moderate assistance;Sitting   Lower Body Dressing: Minimal assistance;Sit to/from stand                       Vision Patient Visual Report: No change from baseline              Pertinent Vitals/Pain Pain Assessment Pain Assessment: Faces Faces Pain Scale: Hurts little more Pain Location: R shoulder Pain Descriptors / Indicators: Discomfort Pain Intervention(s): Monitored during session, Premedicated before session, Repositioned, Ice applied     Hand Dominance        Communication Communication Communication: No difficulties   Cognition Arousal/Alertness: Awake/alert Behavior During Therapy: WFL for tasks assessed/performed Overall Cognitive Status: Within Functional Limits for tasks assessed  Home Living Family/patient expects to be discharged to:: Private residence Living Arrangements: Spouse/significant other Available Help at Discharge: Available 24 hours/day;Family   Home Access: Stairs to enter Entrance Stairs-Number of Steps: 3 Entrance Stairs-Rails: Right;Left Home Layout: One level     Bathroom Shower/Tub: Teacher, early years/pre: [place] - single point;Rollator (4 wheels)          Prior Functioning/Environment Prior Level of Function :  Independent/Modified Independent                                 OT Goals(Current goals can be found in the care plan section) Acute Rehab OT Goals Patient Stated Goal: to return to PLOF OT Goal Formulation: With patient/family Time For Goal Achievement: 09/28/21 Potential to Achieve Goals: Good  OT Frequency:         AM-PAC OT "6 Clicks" Daily Activity     Outcome Measure Help from another person eating meals?: None Help from another person taking care of personal grooming?: None Help from another person toileting, which includes using toliet, bedpan, or urinal?: A Little Help from another person bathing (including washing, rinsing, drying)?: A Little Help from another person to put on and taking off regular upper body clothing?: A Little Help from another person to put on and taking off regular lower body clothing?: A Little 6 Click Score: 20   End of Session Nurse Communication: Mobility status  Activity Tolerance: Patient tolerated treatment well Patient left: in bed;with call bell/phone within reach;with bed alarm set;with family/visitor present                   Time: 4967-5916 OT Time Calculation (min): 49 min Charges:  OT General Charges $OT Visit: 1 Visit OT Evaluation $OT Eval Low Complexity: 1 Low OT Treatments $Self Care/Home Management : 8-22 mins  Darleen Crocker, MS, OTR/L , CBIS ascom [phone]  09/28/21, 11:16 AM

## 2021-09-29 LAB — SURGICAL PATHOLOGY

## 2021-09-29 NOTE — Therapy (Signed)
OUTPATIENT PHYSICAL THERAPY SHOULDER EVALUATION   Patient Name: [name] MRN: [id number] DOB:01-Feb-1955, 66 y.o., male Today's Date: 10/01/2021   PT End of Session - 09/30/21 0850     Visit Number 1    Number of Visits 25    Date for PT Re-Evaluation 12/23/21    Authorization Type eval: 09/30/21    PT Start Time 0850    PT Stop Time 0930    PT Time Calculation (min) 40 min    Activity Tolerance Patient limited by pain    Behavior During Therapy [facility] for tasks assessed/performed             Past Medical History:  Diagnosis Date   Aortic atherosclerosis ([place])    Cardiomyopathy (in setting of Afib)    a.) TTE 12/26/2013: EF 45-50%, mild ant and antsept HK. mild MR. Mod dil LA. nl RV fxn. Rhythm was Afib; b.) TTE 07/04/2019: EF 55%, mid-apical anteroseptal HK, mild MAC, mild Ao sclerosis, G2DD, RVSP 45.3   Chronic pain syndrome    a.) followed by pain management   Chronic, continuous use of opioids    a.) hydrocodone/APAP 7.5/325 mg; followed by pain management   Coronary artery disease 11/06/2014   a.) cCTA 11/06/2014: Ca score 224 (all in pLAD) -- 74th percentile for age/sex matched control   Diverticulosis    DJD (degenerative joint disease) of knee    History of hiatal hernia    History of kidney stones 2012   Hyperlipidemia    Hyperplastic colon polyp    Hypertension    Internal hemorrhoids    Intervertebral disc disorder with radiculopathy of lumbosacral region    Long term current use FULL DOSE (325 mg) aspirin    Long term current use of amiodarone    Long term current use of antithrombotics/antiplatelets    a.) rivaroxaban   Lower extremity edema    Mixed hyperlipidemia    Myalgia due to statin    Osteoarthritis    PAF (paroxysmal atrial fibrillation) ([place])    a.) CHA2DS2VASc = 4 (age, HTN, CHF, vascular disease history);  b.) s/p DCCV 03/13/2014 (200 J x1); c.) s/p DCCV 05/15/2019 (150 J x 1, 200 J x2); d.) rate/rhythm maintained on oral amiodarone +  metoprolol succinate; chronically anticoagulated with rivaroxaban   Pernicious anemia    Prostate cancer ([place])    Pulmonary nodule, right    a. 10/2014 Cardiac CTA: 353m RLL nodule; b. 04/2015 CT Chest: stable 759mRLL nodule. No new nodules; 02/2017 CTA Chest: stable, benign, 53m51mLL pulm nodule.   Sleep difficulties    a.) takes melatonin + trazodone PRN   SVT (supraventricular tachycardia) ([facility])    Tubular adenoma of colon    Past Surgical History:  Procedure Laterality Date   BICEPT TENODESIS Right 09/27/2021   Procedure: Right reverse shoulder arthroplasty, biceps tenodesis;  Surgeon: PatLeim FabryD;  Location: [facility] ORS;  Service: Orthopedics;  Laterality: Right;   CARDIOVERSION N/A 05/15/2019   Procedure: CARDIOVERSION;  Surgeon: GolMinna MerrittsD;  Location: [facility];  Service: Cardiovascular;  Laterality: N/A;   CARDIOVERSION N/A 03/13/2014   Procedure: CARDIOVERSION; [facility]: TimIda RogueD   CARPAL TUNNEL RELEASE Right 12/23/2014   Procedure: CARPAL TUNNEL RELEASE;  Surgeon: HowEarnestine LeysD;  Location: [facility] OR[facility];  Service: Orthopedics;  Laterality: Right;   CARPAL TUNNEL RELEASE Left 01/06/2015   Procedure: CARPAL TUNNEL RELEASE;  Surgeon: HowEarnestine LeysD;  Location: [facility] OR[facility];  Service: Orthopedics;  Laterality:  Left;   COLONOSCOPY WITH PROPOFOL N/A 10/08/2017   Procedure: COLONOSCOPY WITH PROPOFOL;  Surgeon: Lin Landsman, MD;  Location: [facility];  Service: Gastroenterology;  Laterality: N/A;   GASTRIC BYPASS  01/17/2000   KNEE SURGERY Right    knee trauma x3   prostate seeding     REVERSE SHOULDER ARTHROPLASTY Right 09/27/2021   Procedure: Right reverse shoulder arthroplasty, biceps tenodesis;  Surgeon: Leim Fabry, MD;  Location: [facility] ORS;  Service: Orthopedics;  Laterality: Right;   Patient Active Problem List   Diagnosis Date Noted   S/p reverse total shoulder arthroplasty 09/27/2021   Lesion of bone of lumbosacral spine (L5)  05/12/2021   Localized osteoarthritis of shoulder regions, bilateral 03/29/2021   Chronic pain of both shoulders 03/29/2021   Drug-induced myopathy 02/21/2021   Trigger finger of right hand 11/15/2020   Prostate cancer ([place]) 07/26/2020   Insomnia 08/01/2019   Primary osteoarthritis of both wrists 02/17/2019   Trigger middle finger of left hand 02/17/2019   Chronic pain syndrome 02/17/2019   Chronic radicular lumbar pain 10/08/2018   Lumbar radiculopathy 10/08/2018   Lumbar degenerative disc disease 10/08/2018   Lumbar facet arthropathy 10/08/2018   Lumbar facet joint syndrome 10/08/2018   Intervertebral disc disorder with radiculopathy of lumbosacral region    Pernicious anemia 05/22/2016   Paroxysmal atrial fibrillation ([place]) 12/20/2015   DJD (degenerative joint disease) of knee 10/27/2014   Bilateral carpal tunnel syndrome 10/27/2014   Morbid obesity with BMI of 50.0-59.9, adult ([place]) 10/27/2014   Mixed hyperlipidemia 10/27/2014   H/O gastric bypass 03/20/2014   Hyperkalemia 03/20/2014   History of prostate cancer 12/26/2013   Essential hypertension 12/26/2013   Encounter for anticoagulation discussion and counseling 12/26/2013    PCP: Olin Hauser, DO  REFERRING PROVIDER: Leim Fabry, MD  REFERRING DIAGNOSIS: M75.121 (ICD-10-CM) - Complete rotator cuff tear or rupture of right shoulder, not specified as traumatic  THERAPY DIAG: Chronic right shoulder pain  Muscle weakness (generalized)  RATIONALE FOR EVALUATION AND TREATMENT: Rehabilitation  ONSET DATE: 09/27/21  FOLLOW UP APPT WITH PROVIDER: Yes, 10/10/21 at 10:15 with Dr. Posey Pronto    SUBJECTIVE:                                                                                                                                                                                         Chief Complaint: Right reverse TSR and right biceps tenodesis on 09/27/21  Pertinent History Pt has a history of chronic R  shoulder pain and failed conservative management. Imaging was consistent with massive, irreparable rotator cuff tear. He underwent right reverse TSR and right biceps tenodesis on 09/27/21. Operative  findings include massive rotator cuff tear (complete supraspinatus, partial infraspinatus, complete subscapularis). No reported post-op complications with the exception of trying to coordinate proper medication management. He was advised by MD that he is allowed to remove his sling to perform pendulums and to bathe. He has been able to perform pendulums since hospital discharge. Pt reports being consistent with ice since discharge. He has been sleeping in chairs as well as in his bed since discharge. He reports using proper support for his RUE. He has a history of chronic back pain which has been worse since admission to the hospital. Pt is wanted to be able to have his L shoulder surgery by the end of this year.    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 Passive RoM allowed to 90 FF and 30 ER. General reverse TSR precautions include no combined shoulder extension, IR, and adduction. No shoulder and elbow AROM (bicep tenodesis) for at least the first 6 weeks to be further assessed  once protocol can be obtained;   Weight Bearing Restrictions: Yes no WB through [place] with: lives with their spouse, granddaughter over frequently (49 years old); Lives in: House/apartment   Patient Goals: Improve R shoulder function, return to working in the yard;    OBJECTIVE:    Patient Surveys  FOTO: 20, predicted improvement to 62 QuickDASH: Deferred   Cognition Patient is oriented to person, place, and time.  Recent memory is intact.  Remote memory is intact.  Attention span and concentration are intact.  Expressive speech is intact.  Patient's fund of knowledge is within normal limits for educational level.     Gross Musculoskeletal Assessment Tremor: None Bulk: Normal Tone: Normal Incision is dry and clean without excessive warmth, redness, or swelling noted around R shoulder;   Gait Deferred   Posture Forward head and rounded shoulders;  Cervical Screen AROM: WFL and painless with overpressure in all planes Spurlings A (ipsilateral lateral flexion/axial compression): R: Not examined L: Not examined Spurlings B (ipsilateral lateral flexion/contralateral rotation/axial compression): R: Not examined L: Not examined Repeated movement: No centralization or peripheralization with protraction or retraction Hoffman Sign (cervical cord compression): R: Not examined L: Not examined ULTT Median: R: Not examined L: Not examined ULTT Ulnar: R: Not examined L: Not examined ULTT Radial: R: Not examined L: Not examined   AROM  AROM (Normal range in degrees) AROM 10/01/2021  Cervical  Flexion (50) WFL  Extension (80) WFL  Right lateral flexion (45) WFL  Left lateral flexion (45) WFL  Right rotation (85) WFL  Left rotation (85) WFL   Right Left  Shoulder    Flexion Approx 60 (PROM)*   Extension    Abduction Approx 70 (PROM)*   External Rotation 0 (PROM)*   Internal Rotation Palm to stomach at neutral shoulder   Hands Behind Head  Hands Behind Back        Elbow    Flexion Full (PROM)   Extension 0 (PROM)   Pronation Full (PROM)   Supination Full (PROM)   (* = pain; Blank rows = not tested)    UE MMT:  MMT (out of 5) Right 10/01/2021 Left 10/01/2021  Cervical (isometric)  Flexion   Extension   Lateral Flexion    Rotation        Shoulder   Flexion    Extension    Abduction    External rotation    Internal rotation    Horizontal abduction    Horizontal adduction    Lower Trapezius    Rhomboids        Elbow  Flexion    Extension    Pronation    Supination        Wrist  Flexion 5 5  Extension 5 5  Radial deviation 5 5  Ulnar deviation 5 5      MCP  Flexion 5 5  Extension 5 5  Abduction 5 5  Adduction 5 5  (* = pain; Blank rows = not tested)  Grip: Strong and roughly symmetrical BUE   Sensation Grossly intact to light touch bilateral UE as determined by testing dermatomes C2-T2. Proprioception and hot/cold testing deferred on this date.   Reflexes Deferred   Palpation  Location LEFT  RIGHT           Subocciptials    Cervical paraspinals    Upper Trapezius    Levator Scapulae    Rhomboid Major/Minor    Sternoclavicular joint    Acromioclavicular joint    Coracoid process    Long head of biceps    Supraspinatus    Infraspinatus    Subscapularis    Teres Minor    Teres Major    Pectoralis Major    Pectoralis Minor    Anterior Deltoid  1  Lateral Deltoid  0  Posterior Deltoid  0  Latissimus Dorsi    Sternocleidomastoid    (Blank rows = not tested) Graded on 0-4 scale (0 = no pain, 1 = pain, 2 = pain with wincing/grimacing/flinching, 3 = pain with withdrawal, 4 = unwilling to allow palpation), (Blank rows = not tested)   Repeated Movements Deferred  Passive Accessory Intervertebral Motion Deferred  Accessory Motions/Glides Muscle Length Testing: Deferred  Special Tests: Deferred  Beighton scale: Deferred     TODAY'S TREATMENT   Manual Therapy   Semirecumbent R shoulder PROM flexion within pain tolerable limit and protocol (approx. 60 degrees), never pushing through resistance and constantly monitoring pain; Semirecumbent R shoulder PROM ER/IR within pain tolerable limit and protocol (stomach to 0 degrees), never pushing through resistance and constantly monitoring pain; Semirecumbent R elbow PROM flexion/extension within pain tolerable limit and protocol never pushing through resistance and constantly monitoring pain; Practiced donning/doffing sling; Standing forward bending R shoulder pendulums flexion/extension, horiz abduction/adduction, and circles x multiple bouts each with verbal cues; Seated scapular retraction x 10; Seated cervical L sidebending stretch x 20s; HEP issued and reviewed with patient;   PATIENT EDUCATION:  Education details: Plan of care, HEP, donning/doffing slight, sleeping positions; Person educated: Patient Education method: Explanation and handout Education comprehension: verbalized understanding and returned demonstration   HOME EXERCISE PROGRAM: Access Code: [email or web address] URL: [email or web address] Date: 10/01/2021 Prepared by: Roxana Hires  Exercises - Flexion-Extension Shoulder Pendulum with Table Support (Mirrored)  - 2-3 x daily - 7 x weekly - 3 sets -  10 reps - Horizontal Shoulder Pendulum with Table Support  - 2-3 x daily - 7 x weekly - 3 sets - 10 reps - Circular Shoulder Pendulum with Table Support  - 2-3 x daily - 7 x weekly - 3 sets - 10 reps - Seated Scapular Retraction  - 2 x daily - 7 x weekly - 2 sets - 10 reps - 3s hold - Seated Cervical Sidebending Stretch  - 2 x daily - 7 x weekly - 3 reps - 30s hold   ASSESSMENT:  CLINICAL IMPRESSION: Patient is a 66 y.o. male who was seen today for physical therapy evaluation and treatment for shoulder pain. Objective impairments include decreased ROM, decreased strength, impaired UE functional use, and pain. These impairments are  limiting patient from meal prep, cleaning, laundry, driving, shopping, community activity, and yard work. Personal factors including Age, Past/current experiences, Time since onset of injury/illness/exacerbation, and 3+ comorbidities: OA, chronic back pain, CAD, obesity, and insomnia  are also affecting patient's functional outcome. Patient will benefit from skilled PT to address above impairments and improve overall function.  REHAB POTENTIAL: Good  CLINICAL DECISION MAKING: Unstable/unpredictable  EVALUATION COMPLEXITY: High   GOALS: Goals reviewed with patient? Yes  SHORT TERM GOALS: Target date: 10/29/2021  Pt will be independent with HEP to improve strength and decrease shoulder pain to improve pain-free function at home and when working in the yard Baseline:  Goal status: INITIAL   LONG TERM GOALS: Target date: 11/26/2021  Pt will increase FOTO to at least 54 to demonstrate significant improvement in function at home and work related to neck pain  Baseline: 09/30/21: 20 Goal status: INITIAL  2.  Pt will decrease worst shoulder pain by at least 3 points on the NPRS in order to demonstrate clinically significant reduction in shoulder pain. Baseline: 09/30/21: worst 10/10 Goal status: INITIAL  3.  Pt will decrease quick DASH score by at least 8% in order to demonstrate clinically significant reduction in disability related to shoulder pain        Baseline: 09/30/21: To be completed Goal status: INITIAL  4. Pt will increase strength of R shoulder flexion and abduction to at least 4/5 in order to improve R shoulder strength and function so he can complete his household responsibilities and work in the yard      Baseline: 09/30/21: Unable to test; Goal status: INITIAL  5. Pt will increase R shoulder AROM flexion and abduction to at least 120 each in to improve R shoulder function so he can complete his household responsibilities and work in the yard      Baseline: 09/30/21: Unable to  test; Goal status: INITIAL   PLAN: PT FREQUENCY: 1-2x/week  PT DURATION: 8 weeks  PLANNED INTERVENTIONS: Therapeutic exercises, Therapeutic activity, Neuromuscular re-education, Balance training, Gait training, Patient/Family education, Joint manipulation, Joint mobilization, Vestibular training, Canalith repositioning, Aquatic Therapy, Dry Needling, Electrical stimulation, Spinal manipulation, Spinal mobilization, Cryotherapy, Moist heat, Traction, Ultrasound, Ionotophoresis 82m/ml Dexamethasone, and Manual therapy  PLAN FOR NEXT SESSION: Review HEP, progress strength/ROM per protocol;  JLyndel SafeHuprich PT, DPT, GCS  Jaycub Noorani 10/01/2021, 12:07 PM

## 2021-09-30 ENCOUNTER — Ambulatory Visit: Payer: Medicare Other | Attending: Orthopedic Surgery

## 2021-09-30 DIAGNOSIS — M6281 Muscle weakness (generalized): Secondary | ICD-10-CM | POA: Insufficient documentation

## 2021-09-30 DIAGNOSIS — G8929 Other chronic pain: Secondary | ICD-10-CM | POA: Insufficient documentation

## 2021-09-30 DIAGNOSIS — M25511 Pain in right shoulder: Secondary | ICD-10-CM | POA: Insufficient documentation

## 2021-10-05 ENCOUNTER — Ambulatory Visit: Payer: Medicare Other

## 2021-10-05 DIAGNOSIS — M6281 Muscle weakness (generalized): Secondary | ICD-10-CM

## 2021-10-05 DIAGNOSIS — G8929 Other chronic pain: Secondary | ICD-10-CM

## 2021-10-06 ENCOUNTER — Ambulatory Visit: Payer: Medicare Other

## 2021-10-06 DIAGNOSIS — M6281 Muscle weakness (generalized): Secondary | ICD-10-CM | POA: Diagnosis not present

## 2021-10-06 DIAGNOSIS — G8929 Other chronic pain: Secondary | ICD-10-CM | POA: Diagnosis not present

## 2021-10-06 DIAGNOSIS — M25511 Pain in right shoulder: Secondary | ICD-10-CM | POA: Diagnosis not present

## 2021-10-06 NOTE — Therapy (Signed)
OUTPATIENT PHYSICAL THERAPY SHOULDER TREATMENT   Patient Name: [name] MRN: [id number] DOB:[birth year], 66 y.o., male Today's Date: 10/06/2021   PT End of Session - 10/06/21 0854     Visit Number 2    Number of Visits 25    Date for PT Re-Evaluation 12/23/21    Authorization Type eval: 09/30/21    PT Start Time 0848    PT Stop Time 0928    PT Time Calculation (min) 40 min    Activity Tolerance Patient limited by pain    Behavior During Therapy [facility] for tasks assessed/performed             Past Medical History:  Diagnosis Date   Aortic atherosclerosis ([place])    Cardiomyopathy (in setting of Afib)    a.) TTE 12/26/2013: EF 45-50%, mild ant and antsept HK. mild MR. Mod dil LA. nl RV fxn. Rhythm was Afib; b.) TTE 07/04/2019: EF 55%, mid-apical anteroseptal HK, mild MAC, mild Ao sclerosis, G2DD, RVSP 45.3   Chronic pain syndrome    a.) followed by pain management   Chronic, continuous use of opioids    a.) hydrocodone/APAP 7.5/325 mg; followed by pain management   Coronary artery disease 11/06/2014   a.) cCTA 11/06/2014: Ca score 224 (all in pLAD) -- 74th percentile for age/sex matched control   Diverticulosis    DJD (degenerative joint disease) of knee    History of hiatal hernia    History of kidney stones 2012   Hyperlipidemia    Hyperplastic colon polyp    Hypertension    Internal hemorrhoids    Intervertebral disc disorder with radiculopathy of lumbosacral region    Long term current use FULL DOSE (325 mg) aspirin    Long term current use of amiodarone    Long term current use of antithrombotics/antiplatelets    a.) rivaroxaban   Lower extremity edema    Mixed hyperlipidemia    Myalgia due to statin    Osteoarthritis    PAF (paroxysmal atrial fibrillation) ([place])    a.) CHA2DS2VASc = 4 (age, HTN, CHF, vascular disease history);  b.) s/p DCCV 03/13/2014 (200 J x1); c.) s/p DCCV 05/15/2019 (150 J x 1, 200 J x2); d.) rate/rhythm maintained on oral amiodarone +  metoprolol succinate; chronically anticoagulated with rivaroxaban   Pernicious anemia    Prostate cancer ([place])    Pulmonary nodule, right    a. 10/2014 Cardiac CTA: 52m RLL nodule; b. 04/2015 CT Chest: stable 717mRLL nodule. No new nodules; 02/2017 CTA Chest: stable, benign, 7m62mLL pulm nodule.   Sleep difficulties    a.) takes melatonin + trazodone PRN   SVT (supraventricular tachycardia) ([facility])    Tubular adenoma of colon    Past Surgical History:  Procedure Laterality Date   BICEPT TENODESIS Right 09/27/2021   Procedure: Right reverse shoulder arthroplasty, biceps tenodesis;  Surgeon: PatLeim FabryD;  Location: [facility];  Service: Orthopedics;  Laterality: Right;   CARDIOVERSION N/A 05/15/2019   Procedure: CARDIOVERSION;  Surgeon: GolMinna MerrittsD;  Location: [facility];  Service: Cardiovascular;  Laterality: N/A;   CARDIOVERSION N/A 03/13/2014   Procedure: CARDIOVERSION; [facility] TimIda RogueD   CARPAL TUNNEL RELEASE Right 12/23/2014   Procedure: CARPAL TUNNEL RELEASE;  Surgeon: HowEarnestine LeysD;  Location: [facility];  Service: Orthopedics;  Laterality: Right;   CARPAL TUNNEL RELEASE Left 01/06/2015   Procedure: CARPAL TUNNEL RELEASE;  Surgeon: HowEarnestine LeysD;  Location: [facility];  Service: Orthopedics;  Laterality:  Left;   COLONOSCOPY WITH PROPOFOL N/A 10/08/2017   Procedure: COLONOSCOPY WITH PROPOFOL;  Surgeon: Lin Landsman, MD;  Location: [facility];  Service: Gastroenterology;  Laterality: N/A;   GASTRIC BYPASS  01/17/2000   KNEE SURGERY Right    knee trauma x3   prostate seeding     REVERSE SHOULDER ARTHROPLASTY Right 09/27/2021   Procedure: Right reverse shoulder arthroplasty, biceps tenodesis;  Surgeon: Leim Fabry, MD;  Location: [facility];  Service: Orthopedics;  Laterality: Right;   Patient Active Problem List   Diagnosis Date Noted   S/p reverse total shoulder arthroplasty 09/27/2021   Lesion of bone of lumbosacral spine (L5)  05/12/2021   Localized osteoarthritis of shoulder regions, bilateral 03/29/2021   Chronic pain of both shoulders 03/29/2021   Drug-induced myopathy 02/21/2021   Trigger finger of right hand 11/15/2020   Prostate cancer ([place]) 07/26/2020   Insomnia 08/01/2019   Primary osteoarthritis of both wrists 02/17/2019   Trigger middle finger of left hand 02/17/2019   Chronic pain syndrome 02/17/2019   Chronic radicular lumbar pain 10/08/2018   Lumbar radiculopathy 10/08/2018   Lumbar degenerative disc disease 10/08/2018   Lumbar facet arthropathy 10/08/2018   Lumbar facet joint syndrome 10/08/2018   Intervertebral disc disorder with radiculopathy of lumbosacral region    Pernicious anemia 05/22/2016   Paroxysmal atrial fibrillation ([place]) 12/20/2015   DJD (degenerative joint disease) of knee 10/27/2014   Bilateral carpal tunnel syndrome 10/27/2014   Morbid obesity with BMI of 50.0-59.9, adult ([place]) 10/27/2014   Mixed hyperlipidemia 10/27/2014   H/O gastric bypass 03/20/2014   Hyperkalemia 03/20/2014   History of prostate cancer 12/26/2013   Essential hypertension 12/26/2013   Encounter for anticoagulation discussion and counseling 12/26/2013    PCP: Olin Hauser, DO  REFERRING PROVIDER: Leim Fabry, MD  REFERRING DIAGNOSIS: M75.121 (ICD-10-CM) - Complete rotator cuff tear or rupture of right shoulder, not specified as traumatic  THERAPY DIAG: Chronic right shoulder pain  Muscle weakness (generalized)  RATIONALE FOR EVALUATION AND TREATMENT: Rehabilitation  ONSET DATE: 09/27/21  FOLLOW UP APPT WITH PROVIDER: Yes, 10/10/21 at 10:15 with Dr. Posey Pronto   FROM INITIAL EVALUATION (09/30/21) SUBJECTIVE:                                                                                                                                                                                         Chief Complaint: Right reverse TSR and right biceps tenodesis on 09/27/21  Pertinent History Pt  has a history of chronic R shoulder pain and failed conservative management. Imaging was consistent with massive, irreparable rotator cuff tear. He underwent right reverse TSR and right biceps tenodesis  on 09/27/21. Operative findings include massive rotator cuff tear (complete supraspinatus, partial infraspinatus, complete subscapularis). No reported post-op complications with the exception of trying to coordinate proper medication management. He was advised by MD that he is allowed to remove his sling to perform pendulums and to bathe. He has been able to perform pendulums since hospital discharge. Pt reports being consistent with ice since discharge. He has been sleeping in chairs as well as in his bed since discharge. He reports using proper support for his RUE. He has a history of chronic back pain which has been worse since admission to the hospital. Pt is wanted to be able to have his L shoulder surgery by the end of this year.    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 Passive RoM allowed to 90 FF and 30 ER. General reverse TSR precautions include no combined shoulder extension, IR, and adduction. No shoulder and elbow AROM (bicep tenodesis) for at least the first 6  weeks to be further assessed once protocol can be obtained;   Weight Bearing Restrictions: Yes no WB through [place] with: lives with their spouse, granddaughter over frequently (107 years old); Lives in: House/apartment   Patient Goals: Improve R shoulder function, return to working in the yard;    OBJECTIVE:    Patient Surveys  FOTO: 20, predicted improvement to 45 QuickDASH: Deferred   Cognition Patient is oriented to person, place, and time.  Recent memory is intact.  Remote memory is intact.  Attention span and concentration are intact.  Expressive speech is intact.  Patient's fund of knowledge is within normal limits for educational level.     Gross Musculoskeletal Assessment Tremor: None Bulk: Normal Tone: Normal Incision is dry and clean without excessive warmth, redness, or swelling noted around R shoulder;   Gait Deferred   Posture Forward head and rounded shoulders;  Cervical Screen AROM: WFL and painless with overpressure in all planes Spurlings A (ipsilateral lateral flexion/axial compression): R: Not examined L: Not examined Spurlings B (ipsilateral lateral flexion/contralateral rotation/axial compression): R: Not examined L: Not examined Repeated movement: No centralization or peripheralization with protraction or retraction Hoffman Sign (cervical cord compression): R: Not examined L: Not examined ULTT Median: R: Not examined L: Not examined ULTT Ulnar: R: Not examined L: Not examined ULTT Radial: R: Not examined L: Not examined   AROM  AROM (Normal range in degrees) AROM 10/06/2021  Cervical  Flexion (50) WFL  Extension (80) WFL  Right lateral flexion (45) WFL  Left lateral flexion (45) WFL  Right rotation (85) WFL  Left rotation (85) WFL   Right Left  Shoulder    Flexion Approx 60 (PROM)*   Extension    Abduction Approx 70 (PROM)*   External Rotation 0 (PROM)*   Internal Rotation Palm to stomach at neutral  shoulder   Hands Behind Head  Hands Behind Back        Elbow    Flexion Full (PROM)   Extension 0 (PROM)   Pronation Full (PROM)   Supination Full (PROM)   (* = pain; Blank rows = not tested)    UE MMT:  MMT (out of 5) Right 10/01/2021 Left 10/01/2021  Cervical (isometric)  Flexion   Extension   Lateral Flexion    Rotation        Shoulder   Flexion    Extension    Abduction    External rotation    Internal rotation    Horizontal abduction    Horizontal adduction    Lower Trapezius    Rhomboids        Elbow  Flexion    Extension    Pronation    Supination        Wrist  Flexion 5 5  Extension 5 5  Radial deviation 5 5  Ulnar deviation 5 5      MCP  Flexion 5 5  Extension 5 5  Abduction 5 5  Adduction 5 5  (* = pain; Blank rows = not tested)  Grip: Strong and roughly symmetrical BUE   Sensation Grossly intact to light touch bilateral UE as determined by testing dermatomes C2-T2. Proprioception and hot/cold testing deferred on this date.   Reflexes Deferred   Palpation  Location LEFT  RIGHT           Subocciptials    Cervical paraspinals    Upper Trapezius    Levator Scapulae    Rhomboid Major/Minor    Sternoclavicular joint    Acromioclavicular joint    Coracoid process    Long head of biceps    Supraspinatus    Infraspinatus    Subscapularis    Teres Minor    Teres Major    Pectoralis Major    Pectoralis Minor    Anterior Deltoid  1  Lateral Deltoid  0  Posterior Deltoid  0  Latissimus Dorsi    Sternocleidomastoid    (Blank rows = not tested) Graded on 0-4 scale (0 = no pain, 1 = pain, 2 = pain with wincing/grimacing/flinching, 3 = pain with withdrawal, 4 = unwilling to allow palpation), (Blank rows = not tested)   Repeated Movements Deferred  Passive Accessory Intervertebral Motion Deferred  Accessory Motions/Glides Muscle Length Testing: Deferred  Special Tests: Deferred  Beighton scale: Deferred     TODAY'S  TREATMENT    SUBJECTIVE: Pt reports that he has been having a lot of back pain which limits his ability to rest. He has had minimal R shoulder pain and is pleased with his progress since surgery. No specific questions currently.   PAIN: 2/10 R shoulder, 9/10 low back;   Manual Therapy  Semirecumbent R elbow PROM flexion/extension within pain tolerable limit and protocol never pushing through resistance and constantly monitoring pain; Semirecumbent R shoulder PROM flexion within pain tolerable limit and protocol (approx. 90 degrees), never pushing through resistance and constantly monitoring pain;; Semirecumbent R shoulder PROM ER/IR within pain tolerable limit and protocol (stomach to 30 degrees), never pushing through resistance and constantly monitoring pain; L sidelying R scapular PAM in multiple directions never through pain or resistance for pain control; Gentle effleurage to L upper trap, levator scapula, and rhomboids Seated R upper trap stretch x 30s; Seated cervical L sidebending stretch x 30s; Donned sling and cold pack applied to R shoulder x 5 minutes (unbilled);   Ther-ex  R forearm AROM pronation/supination x 10 each; R wrist extension with manual resistance from therapist x 10; R wrist flexion with manual resistance form therapist x 10; Seated scapular retractions 2 x 10, cues to avoid AROM of R shoulder; Standing forward bending R shoulder pendulums flexion/extension, horiz abduction/adduction, and circles x multiple bouts each with verbal cues;   PATIENT EDUCATION:  Education details: Pt educated throughout session about proper posture and technique with exercises. Improved exercise technique, movement at target joints, use of target muscles after min to mod verbal, visual, tactile cues.  Person educated: Patient Education method: Explanation, verbal cues, tactile cues Education comprehension: verbalized understanding and returned demonstration   HOME EXERCISE  PROGRAM: Access Code: [email or web address] URL: [email or web address] Date: 10/01/2021 Prepared by: Roxana Hires  Exercises - Flexion-Extension Shoulder Pendulum with Table Support (Mirrored)  - 2-3 x daily - 7 x weekly - 3 sets - 10 reps - Horizontal Shoulder Pendulum with Table Support  - 2-3 x daily - 7 x weekly - 3 sets - 10 reps - Circular Shoulder Pendulum with Table Support  - 2-3 x daily - 7 x weekly - 3 sets - 10 reps - Seated Scapular Retraction  - 2 x daily - 7 x weekly - 2 sets - 10 reps - 3s hold - Seated Cervical Sidebending Stretch  - 2 x daily - 7 x weekly - 3 reps - 30s hold   ASSESSMENT:  CLINICAL IMPRESSION: Performed extension R shoulder PROM within limitations of protocol and never pushing through pain/resistance. Pt demonstrates significant decrease in muscle spasm today during PROM as well as notable improvement in range of motion. Repeated cervical stretches and introduced STM for L upper quarter to decrease muscle tension. Pt demonstrates good form and technique with pendulums today. Continue HEP and follow-up as scheduled. Patient will benefit from skilled PT to address above impairments and improve overall function.  REHAB POTENTIAL: Good  CLINICAL DECISION MAKING: Unstable/unpredictable  EVALUATION COMPLEXITY: High   GOALS: Goals reviewed with patient? Yes  SHORT TERM GOALS: Target date: 11/03/2021  Pt will be independent with HEP to improve strength and decrease shoulder pain to improve pain-free function at home and when working in the yard Baseline:  Goal status: INITIAL   LONG TERM GOALS: Target date: 12/01/2021  Pt will increase FOTO to at least 54 to demonstrate significant improvement in function at home and work related to neck pain  Baseline: 09/30/21: 20 Goal status: INITIAL  2.  Pt will decrease worst shoulder pain by at least 3 points on the NPRS in order to demonstrate clinically significant reduction in shoulder pain. Baseline:  09/30/21: worst 10/10 Goal status: INITIAL  3.  Pt will decrease quick DASH score by at least 8% in order to demonstrate clinically significant reduction in disability related to shoulder pain        Baseline: 09/30/21: To be completed Goal status: INITIAL  4. Pt will increase strength of R shoulder flexion and abduction to at least 4/5 in order to improve R shoulder strength and function so he can complete his household responsibilities and work in the yard      Baseline: 09/30/21: Unable to test; Goal status: INITIAL  5. Pt will increase R shoulder AROM flexion and abduction to at least 120 each in to improve R shoulder function so he can complete his household responsibilities and work in the yard      Baseline: 09/30/21: Unable to test; Goal status: INITIAL   PLAN: PT FREQUENCY: 1-2x/week  PT DURATION: 8 weeks  PLANNED INTERVENTIONS: Therapeutic exercises, Therapeutic activity, Neuromuscular re-education, Balance training, Gait training, Patient/Family education, Joint manipulation, Joint mobilization, Vestibular training, Canalith repositioning, Aquatic Therapy, Dry Needling, Electrical stimulation, Spinal manipulation, Spinal mobilization, Cryotherapy, Moist heat, Traction, Ultrasound, Ionotophoresis 65m/ml Dexamethasone, and Manual therapy  PLAN FOR NEXT SESSION: Review/modify HEP as necessary, progress strength/ROM per protocol;  JLyndel SafeHuprich PT, DPT, GCS  Momina Hunton 10/06/2021, 9:58 AM

## 2021-10-06 NOTE — Therapy (Signed)
OUTPATIENT PHYSICAL THERAPY SHOULDER TREATMENT   Patient Name: MARQUIST BINSTOCK MRN: [id number] DOB:[birth year], 66 y.o., male Today's Date: 10/07/2021   PT End of Session - 10/07/21 0802     Visit Number 3    Number of Visits 25    Date for PT Re-Evaluation 12/23/21    Authorization Type eval: 09/30/21    PT Start Time 0800    PT Stop Time 0845    PT Time Calculation (min) 45 min    Activity Tolerance Patient limited by pain    Behavior During Therapy [facility] for tasks assessed/performed              Past Medical History:  Diagnosis Date   Aortic atherosclerosis ([place])    Cardiomyopathy (in setting of Afib)    a.) TTE 12/26/2013: EF 45-50%, mild ant and antsept HK. mild MR. Mod dil LA. nl RV fxn. Rhythm was Afib; b.) TTE 07/04/2019: EF 55%, mid-apical anteroseptal HK, mild MAC, mild Ao sclerosis, G2DD, RVSP 45.3   Chronic pain syndrome    a.) followed by pain management   Chronic, continuous use of opioids    a.) hydrocodone/APAP 7.5/325 mg; followed by pain management   Coronary artery disease 11/06/2014   a.) cCTA 11/06/2014: Ca score 224 (all in pLAD) -- 74th percentile for age/sex matched control   Diverticulosis    DJD (degenerative joint disease) of knee    History of hiatal hernia    History of kidney stones 2012   Hyperlipidemia    Hyperplastic colon polyp    Hypertension    Internal hemorrhoids    Intervertebral disc disorder with radiculopathy of lumbosacral region    Long term current use FULL DOSE (325 mg) aspirin    Long term current use of amiodarone    Long term current use of antithrombotics/antiplatelets    a.) rivaroxaban   Lower extremity edema    Mixed hyperlipidemia    Myalgia due to statin    Osteoarthritis    PAF (paroxysmal atrial fibrillation) ([place])    a.) CHA2DS2VASc = 4 (age, HTN, CHF, vascular disease history);  b.) s/p DCCV 03/13/2014 (200 J x1); c.) s/p DCCV 05/15/2019 (150 J x 1, 200 J x2); d.) rate/rhythm maintained on oral amiodarone +  metoprolol succinate; chronically anticoagulated with rivaroxaban   Pernicious anemia    Prostate cancer ([place])    Pulmonary nodule, right    a. 10/2014 Cardiac CTA: 60m RLL nodule; b. 04/2015 CT Chest: stable 781mRLL nodule. No new nodules; 02/2017 CTA Chest: stable, benign, 69m90mLL pulm nodule.   Sleep difficulties    a.) takes melatonin + trazodone PRN   SVT (supraventricular tachycardia) ([facility])    Tubular adenoma of colon    Past Surgical History:  Procedure Laterality Date   BICEPT TENODESIS Right 09/27/2021   Procedure: Right reverse shoulder arthroplasty, biceps tenodesis;  Surgeon: PatLeim FabryD;  Location: [facility] ORS;  Service: Orthopedics;  Laterality: Right;   CARDIOVERSION N/A 05/15/2019   Procedure: CARDIOVERSION;  Surgeon: GolMinna MerrittsD;  Location: [facility];  Service: Cardiovascular;  Laterality: N/A;   CARDIOVERSION N/A 03/13/2014   Procedure: CARDIOVERSION; [facility]: TimIda RogueD   CARPAL TUNNEL RELEASE Right 12/23/2014   Procedure: CARPAL TUNNEL RELEASE;  Surgeon: HowEarnestine LeysD;  Location: [facility];  Service: Orthopedics;  Laterality: Right;   CARPAL TUNNEL RELEASE Left 01/06/2015   Procedure: CARPAL TUNNEL RELEASE;  Surgeon: HowEarnestine LeysD;  Location: [facility];  Service: Orthopedics;  Laterality: Left;   COLONOSCOPY WITH PROPOFOL N/A 10/08/2017   Procedure: COLONOSCOPY WITH PROPOFOL;  Surgeon: Lin Landsman, MD;  Location: [facility];  Service: Gastroenterology;  Laterality: N/A;   GASTRIC BYPASS  01/17/2000   KNEE SURGERY Right    knee trauma x3   prostate seeding     REVERSE SHOULDER ARTHROPLASTY Right 09/27/2021   Procedure: Right reverse shoulder arthroplasty, biceps tenodesis;  Surgeon: Leim Fabry, MD;  Location: [facility];  Service: Orthopedics;  Laterality: Right;   Patient Active Problem List   Diagnosis Date Noted   S/p reverse total shoulder arthroplasty 09/27/2021   Lesion of bone of lumbosacral spine (L5)  05/12/2021   Localized osteoarthritis of shoulder regions, bilateral 03/29/2021   Chronic pain of both shoulders 03/29/2021   Drug-induced myopathy 02/21/2021   Trigger finger of right hand 11/15/2020   Prostate cancer ([place]) 07/26/2020   Insomnia 08/01/2019   Primary osteoarthritis of both wrists 02/17/2019   Trigger middle finger of left hand 02/17/2019   Chronic pain syndrome 02/17/2019   Chronic radicular lumbar pain 10/08/2018   Lumbar radiculopathy 10/08/2018   Lumbar degenerative disc disease 10/08/2018   Lumbar facet arthropathy 10/08/2018   Lumbar facet joint syndrome 10/08/2018   Intervertebral disc disorder with radiculopathy of lumbosacral region    Pernicious anemia 05/22/2016   Paroxysmal atrial fibrillation ([place]) 12/20/2015   DJD (degenerative joint disease) of knee 10/27/2014   Bilateral carpal tunnel syndrome 10/27/2014   Morbid obesity with BMI of 50.0-59.9, adult ([place]) 10/27/2014   Mixed hyperlipidemia 10/27/2014   H/O gastric bypass 03/20/2014   Hyperkalemia 03/20/2014   History of prostate cancer 12/26/2013   Essential hypertension 12/26/2013   Encounter for anticoagulation discussion and counseling 12/26/2013    PCP: Olin Hauser, DO  REFERRING PROVIDER: Leim Fabry, MD  REFERRING DIAGNOSIS: M75.121 (ICD-10-CM) - Complete rotator cuff tear or rupture of right shoulder, not specified as traumatic  THERAPY DIAG: Chronic right shoulder pain  Muscle weakness (generalized)  RATIONALE FOR EVALUATION AND TREATMENT: Rehabilitation  ONSET DATE: 09/27/21  FOLLOW UP APPT WITH PROVIDER: Yes, 10/10/21 at 10:15 with Dr. Posey Pronto   FROM INITIAL EVALUATION (09/30/21) SUBJECTIVE:                                                                                                                                                                                         Chief Complaint: Right reverse TSR and right biceps tenodesis on 09/27/21  Pertinent History Pt  has a history of chronic R shoulder pain and failed conservative management. Imaging was consistent with massive, irreparable rotator cuff tear. He underwent right reverse TSR and right biceps  tenodesis on 09/27/21. Operative findings include massive rotator cuff tear (complete supraspinatus, partial infraspinatus, complete subscapularis). No reported post-op complications with the exception of trying to coordinate proper medication management. He was advised by MD that he is allowed to remove his sling to perform pendulums and to bathe. He has been able to perform pendulums since hospital discharge. Pt reports being consistent with ice since discharge. He has been sleeping in chairs as well as in his bed since discharge. He reports using proper support for his RUE. He has a history of chronic back pain which has been worse since admission to the hospital. Pt is wanted to be able to have his L shoulder surgery by the end of this year.   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 Passive RoM allowed to 90 FF and 30 ER. General reverse TSR precautions include no combined shoulder extension, IR, and adduction. No shoulder and elbow AROM (bicep tenodesis) for at least the first 6 weeks  to be further assessed once protocol can be obtained;  Weight Bearing Restrictions: Yes no WB through [place] with: lives with their spouse, granddaughter over frequently (68 years old); Lives in: House/apartment   Patient Goals: Improve R shoulder function, return to working in the yard;    OBJECTIVE:   Patient Surveys  FOTO: 20, predicted improvement to 19 QuickDASH: Deferred  Cognition Patient is oriented to person, place, and time.  Recent memory is intact.  Remote memory is intact.  Attention span and concentration are intact.  Expressive speech is intact.  Patient's fund of knowledge is within normal limits for educational level.    Gross Musculoskeletal Assessment Tremor: None Bulk: Normal Tone: Normal Incision is dry and clean without excessive warmth, redness, or swelling noted around R shoulder;  Gait Deferred  Posture Forward head and rounded shoulders;  Cervical Screen AROM: WFL and painless with overpressure in all planes Spurlings A (ipsilateral lateral flexion/axial compression): R: Not examined L: Not examined Spurlings B (ipsilateral lateral flexion/contralateral rotation/axial compression): R: Not examined L: Not examined Repeated movement: No centralization or peripheralization with protraction or retraction Hoffman Sign (cervical cord compression): R: Not examined L: Not examined ULTT Median: R: Not examined L: Not examined ULTT Ulnar: R: Not examined L: Not examined ULTT Radial: R: Not examined L: Not examined  AROM  AROM (Normal range in degrees) AROM 10/07/2021  Cervical  Flexion (50) WFL  Extension (80) WFL  Right lateral flexion (45) WFL  Left lateral flexion (45) WFL  Right rotation (85) WFL  Left rotation (85) WFL   Right Left  Shoulder    Flexion Approx 60 (PROM)*   Extension    Abduction Approx 70 (PROM)*   External Rotation 0 (PROM)*   Internal Rotation Palm to stomach at neutral shoulder   Hands  Behind Head    Hands Behind Back  Elbow    Flexion Full (PROM)   Extension 0 (PROM)   Pronation Full (PROM)   Supination Full (PROM)   (* = pain; Blank rows = not tested)  UE MMT:  MMT (out of 5) Right 10/01/2021 Left 10/01/2021  Cervical (isometric)  Flexion   Extension   Lateral Flexion    Rotation        Shoulder   Flexion    Extension    Abduction    External rotation    Internal rotation    Horizontal abduction    Horizontal adduction    Lower Trapezius    Rhomboids        Elbow  Flexion    Extension    Pronation    Supination        Wrist  Flexion 5 5  Extension 5 5  Radial deviation 5 5  Ulnar deviation 5 5      MCP  Flexion 5 5  Extension 5 5  Abduction 5 5  Adduction 5 5  (* = pain; Blank rows = not tested)  Grip: Strong and roughly symmetrical BUE   Sensation Grossly intact to light touch bilateral UE as determined by testing dermatomes C2-T2. Proprioception and hot/cold testing deferred on this date.   Reflexes Deferred   Palpation  Location LEFT  RIGHT           Subocciptials    Cervical paraspinals    Upper Trapezius    Levator Scapulae    Rhomboid Major/Minor    Sternoclavicular joint    Acromioclavicular joint    Coracoid process    Long head of biceps    Supraspinatus    Infraspinatus    Subscapularis    Teres Minor    Teres Major    Pectoralis Major    Pectoralis Minor    Anterior Deltoid  1  Lateral Deltoid  0  Posterior Deltoid  0  Latissimus Dorsi    Sternocleidomastoid    (Blank rows = not tested) Graded on 0-4 scale (0 = no pain, 1 = pain, 2 = pain with wincing/grimacing/flinching, 3 = pain with withdrawal, 4 = unwilling to allow palpation), (Blank rows = not tested)  Repeated Movements Deferred  Passive Accessory Intervertebral Motion Deferred  Accessory Motions/Glides Muscle Length Testing: Deferred  Special Tests: Deferred  Beighton scale: Deferred    TODAY'S TREATMENT     SUBJECTIVE: Pt reports that his back continues to hurt which interrups his sleep. He has had minimal R shoulder pain and denies any resting pain upon arrival. No specific questions currently.   PAIN: Not rated, confirms low back pain but denies resting R shoulder pain   Manual Therapy  Semirecumbent R elbow PROM flexion/extension within pain tolerable limit and protocol never pushing through resistance and constantly monitoring pain; Semirecumbent R shoulder PROM flexion within pain tolerable limit and protocol (approx. 90 degrees), never pushing through resistance and constantly monitoring pain;; Semirecumbent R shoulder PROM ER/IR within pain tolerable limit and protocol (stomach to 30 degrees), never pushing through resistance and constantly monitoring pain; L sidelying R scapular PAM in multiple directions never through pain or resistance for pain control; Gentle effleurage to L upper trap, levator scapula, and rhomboids Seated R upper trap stretch x 30s; Seated cervical L sidebending stretch x 30s; Donned sling and cold pack applied to R shoulder x 5 minutes (unbilled);   Ther-ex  R forearm AROM pronation/supination 2 x 15 each; R wrist extension with manual resistance from  therapist 2 x 15; R wrist flexion with manual resistance form therapist 2 x 15; Seated scapular retractions 2 x 10, cues to avoid AROM of R shoulder; Standing forward bending R shoulder pendulums flexion/extension, horiz abduction/adduction, and circles x multiple bouts each with verbal cues;   PATIENT EDUCATION:  Education details: Pt educated throughout session about proper posture and technique with exercises. Improved exercise technique, movement at target joints, use of target muscles after min to mod verbal, visual, tactile cues.  Person educated: Patient Education method: Explanation, verbal cues, tactile cues Education comprehension: verbalized understanding and returned demonstration   HOME  EXERCISE PROGRAM: Access Code: [email or web address] URL: [email or web address] Date: 10/01/2021 Prepared by: Roxana Hires  Exercises - Flexion-Extension Shoulder Pendulum with Table Support (Mirrored)  - 2-3 x daily - 7 x weekly - 3 sets - 10 reps - Horizontal Shoulder Pendulum with Table Support  - 2-3 x daily - 7 x weekly - 3 sets - 10 reps - Circular Shoulder Pendulum with Table Support  - 2-3 x daily - 7 x weekly - 3 sets - 10 reps - Seated Scapular Retraction  - 2 x daily - 7 x weekly - 2 sets - 10 reps - 3s hold - Seated Cervical Sidebending Stretch  - 2 x daily - 7 x weekly - 3 reps - 30s hold   ASSESSMENT:  CLINICAL IMPRESSION: Performed extensive R shoulder PROM within limitations of protocol and never pushing through pain/resistance. Pt continues to demonstrate improved ability to relax R shoulder muscles. No spasms noted today. Repeated cervical stretches and continuedSTM for L upper quarter to decrease muscle tension. Continue HEP and follow-up as scheduled. Patient will benefit from skilled PT to address above impairments and improve overall function.  REHAB POTENTIAL: Good  CLINICAL DECISION MAKING: Unstable/unpredictable  EVALUATION COMPLEXITY: High   GOALS: Goals reviewed with patient? Yes  SHORT TERM GOALS: Target date: 11/04/2021  Pt will be independent with HEP to improve strength and decrease shoulder pain to improve pain-free function at home and when working in the yard Baseline:  Goal status: INITIAL   LONG TERM GOALS: Target date: 12/02/2021  Pt will increase FOTO to at least 54 to demonstrate significant improvement in function at home and work related to neck pain  Baseline: 09/30/21: 20 Goal status: INITIAL  2.  Pt will decrease worst shoulder pain by at least 3 points on the NPRS in order to demonstrate clinically significant reduction in shoulder pain. Baseline: 09/30/21: worst 10/10 Goal status: INITIAL  3.  Pt will decrease quick DASH  score by at least 8% in order to demonstrate clinically significant reduction in disability related to shoulder pain        Baseline: 09/30/21: To be completed Goal status: INITIAL  4. Pt will increase strength of R shoulder flexion and abduction to at least 4/5 in order to improve R shoulder strength and function so he can complete his household responsibilities and work in the yard      Baseline: 09/30/21: Unable to test; Goal status: INITIAL  5. Pt will increase R shoulder AROM flexion and abduction to at least 120 each in to improve R shoulder function so he can complete his household responsibilities and work in the yard      Baseline: 09/30/21: Unable to test; Goal status: INITIAL   PLAN: PT FREQUENCY: 1-2x/week  PT DURATION: 8 weeks  PLANNED INTERVENTIONS: Therapeutic exercises, Therapeutic activity, Neuromuscular re-education, Balance training, Gait training, Patient/Family education, Joint manipulation, Joint mobilization, Vestibular training, Canalith  repositioning, Aquatic Therapy, Dry Needling, Electrical stimulation, Spinal manipulation, Spinal mobilization, Cryotherapy, Moist heat, Traction, Ultrasound, Ionotophoresis 15m/ml Dexamethasone, and Manual therapy  PLAN FOR NEXT SESSION: Review/modify HEP as necessary, progress strength/ROM per protocol;  JLyndel SafeHuprich PT, DPT, GCS  Tambi Thole 10/07/2021, 1:02 PM

## 2021-10-07 ENCOUNTER — Ambulatory Visit: Payer: Medicare Other

## 2021-10-07 DIAGNOSIS — M6281 Muscle weakness (generalized): Secondary | ICD-10-CM | POA: Diagnosis not present

## 2021-10-07 DIAGNOSIS — M25511 Pain in right shoulder: Secondary | ICD-10-CM | POA: Diagnosis not present

## 2021-10-07 DIAGNOSIS — G8929 Other chronic pain: Secondary | ICD-10-CM | POA: Diagnosis not present

## 2021-10-07 NOTE — Therapy (Unsigned)
OUTPATIENT PHYSICAL THERAPY SHOULDER TREATMENT   Patient Name: [name] MRN: [id number] DOB:08/21/1955, 66 y.o., male Today's Date: 10/11/2021   PT End of Session - 10/11/21 0947     Visit Number 4    Number of Visits 25    Date for PT Re-Evaluation 12/23/21    Authorization Type eval: 09/30/21    PT Start Time 1405    PT Stop Time 1445    PT Time Calculation (min) 40 min    Activity Tolerance Patient limited by pain    Behavior During Therapy [facility] for tasks assessed/performed               Past Medical History:  Diagnosis Date   Aortic atherosclerosis ([place])    Cardiomyopathy (in setting of Afib)    a.) TTE 12/26/2013: EF 45-50%, mild ant and antsept HK. mild MR. Mod dil LA. nl RV fxn. Rhythm was Afib; b.) TTE 07/04/2019: EF 55%, mid-apical anteroseptal HK, mild MAC, mild Ao sclerosis, G2DD, RVSP 45.3   Chronic pain syndrome    a.) followed by pain management   Chronic, continuous use of opioids    a.) hydrocodone/APAP 7.5/325 mg; followed by pain management   Coronary artery disease 11/06/2014   a.) cCTA 11/06/2014: Ca score 224 (all in pLAD) -- 74th percentile for age/sex matched control   Diverticulosis    DJD (degenerative joint disease) of knee    History of hiatal hernia    History of kidney stones 2012   Hyperlipidemia    Hyperplastic colon polyp    Hypertension    Internal hemorrhoids    Intervertebral disc disorder with radiculopathy of lumbosacral region    Long term current use FULL DOSE (325 mg) aspirin    Long term current use of amiodarone    Long term current use of antithrombotics/antiplatelets    a.) rivaroxaban   Lower extremity edema    Mixed hyperlipidemia    Myalgia due to statin    Osteoarthritis    PAF (paroxysmal atrial fibrillation) ([place])    a.) CHA2DS2VASc = 4 (age, HTN, CHF, vascular disease history);  b.) s/p DCCV 03/13/2014 (200 J x1); c.) s/p DCCV 05/15/2019 (150 J x 1, 200 J x2); d.) rate/rhythm maintained on oral amiodarone +  metoprolol succinate; chronically anticoagulated with rivaroxaban   Pernicious anemia    Prostate cancer ([place])    Pulmonary nodule, right    a. 10/2014 Cardiac CTA: 28m RLL nodule; b. 04/2015 CT Chest: stable 720mRLL nodule. No new nodules; 02/2017 CTA Chest: stable, benign, 25m104mLL pulm nodule.   Sleep difficulties    a.) takes melatonin + trazodone PRN   SVT (supraventricular tachycardia) (HCC)    Tubular adenoma of colon    Past Surgical History:  Procedure Laterality Date   BICEPT TENODESIS Right 09/27/2021   Procedure: Right reverse shoulder arthroplasty, biceps tenodesis;  Surgeon: PatLeim FabryD;  Location: [facility] ORS;  Service: Orthopedics;  Laterality: Right;   CARDIOVERSION N/A 05/15/2019   Procedure: CARDIOVERSION;  Surgeon: GolMinna MerrittsD;  Location: [facility] ORS;  Service: Cardiovascular;  Laterality: N/A;   CARDIOVERSION N/A 03/13/2014   Procedure: CARDIOVERSION; [facility]: TimIda RogueD   CARPAL TUNNEL RELEASE Right 12/23/2014   Procedure: CARPAL TUNNEL RELEASE;  Surgeon: HowEarnestine LeysD;  Location: [facility] ORS;  Service: Orthopedics;  Laterality: Right;   CARPAL TUNNEL RELEASE Left 01/06/2015   Procedure: CARPAL TUNNEL RELEASE;  Surgeon: HowEarnestine LeysD;  Location: [facility] ORS;  Service: Orthopedics;  Laterality: Left;   COLONOSCOPY WITH PROPOFOL N/A 10/08/2017   Procedure: COLONOSCOPY WITH PROPOFOL;  Surgeon: Lin Landsman, MD;  [facility];  Service: Gastroenterology;  Laterality: N/A;   GASTRIC BYPASS  01/17/2000   KNEE SURGERY Right    knee trauma x3   prostate seeding     REVERSE SHOULDER ARTHROPLASTY Right 09/27/2021   Procedure: Right reverse shoulder arthroplasty, biceps tenodesis;  Surgeon: Leim Fabry, MD;  Location: [facility] ORS;  Service: Orthopedics;  Laterality: Right;   Patient Active Problem List   Diagnosis Date Noted   S/p reverse total shoulder arthroplasty 09/27/2021   Lesion of bone of lumbosacral spine (L5)  05/12/2021   Localized osteoarthritis of shoulder regions, bilateral 03/29/2021   Chronic pain of both shoulders 03/29/2021   Drug-induced myopathy 02/21/2021   Trigger finger of right hand 11/15/2020   Prostate cancer ([place]) 07/26/2020   Insomnia 08/01/2019   Primary osteoarthritis of both wrists 02/17/2019   Trigger middle finger of left hand 02/17/2019   Chronic pain syndrome 02/17/2019   Chronic radicular lumbar pain 10/08/2018   Lumbar radiculopathy 10/08/2018   Lumbar degenerative disc disease 10/08/2018   Lumbar facet arthropathy 10/08/2018   Lumbar facet joint syndrome 10/08/2018   Intervertebral disc disorder with radiculopathy of lumbosacral region    Pernicious anemia 05/22/2016   Paroxysmal atrial fibrillation ([place]) 12/20/2015   DJD (degenerative joint disease) of knee 10/27/2014   Bilateral carpal tunnel syndrome 10/27/2014   Morbid obesity with BMI of 50.0-59.9, adult ([place]) 10/27/2014   Mixed hyperlipidemia 10/27/2014   H/O gastric bypass 03/20/2014   Hyperkalemia 03/20/2014   History of prostate cancer 12/26/2013   Essential hypertension 12/26/2013   Encounter for anticoagulation discussion and counseling 12/26/2013    PCP: Olin Hauser, DO  REFERRING PROVIDER: Leim Fabry, MD  REFERRING DIAGNOSIS: M75.121 (ICD-10-CM) - Complete rotator cuff tear or rupture of right shoulder, not specified as traumatic  THERAPY DIAG: Chronic right shoulder pain  Muscle weakness (generalized)  RATIONALE FOR EVALUATION AND TREATMENT: Rehabilitation  ONSET DATE: 09/27/21  FOLLOW UP APPT WITH PROVIDER: Yes, 10/10/21 at 10:15 with Dr. Posey Pronto   FROM INITIAL EVALUATION (09/30/21) SUBJECTIVE:                                                                                                                                                                                         Chief Complaint: Right reverse TSR and right biceps tenodesis on 09/27/21  Pertinent History Pt  has a history of chronic R shoulder pain and failed conservative management. Imaging was consistent with massive, irreparable rotator cuff tear. He underwent right reverse TSR and right biceps  tenodesis on 09/27/21. Operative findings include massive rotator cuff tear (complete supraspinatus, partial infraspinatus, complete subscapularis). No reported post-op complications with the exception of trying to coordinate proper medication management. He was advised by MD that he is allowed to remove his sling to perform pendulums and to bathe. He has been able to perform pendulums since hospital discharge. Pt reports being consistent with ice since discharge. He has been sleeping in chairs as well as in his bed since discharge. He reports using proper support for his RUE. He has a history of chronic back pain which has been worse since admission to the hospital. Pt is wanted to be able to have his L shoulder surgery by the end of this year.   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 Passive RoM allowed to 90 FF and 30 ER. General reverse TSR precautions include no combined shoulder extension, IR, and adduction. No shoulder and elbow AROM (bicep tenodesis) for at least the first 6 weeks  to be further assessed once protocol can be obtained;  Weight Bearing Restrictions: Yes no WB through [place] with: lives with their spouse, granddaughter over frequently (72 years old); Lives in: House/apartment   Patient Goals: Improve R shoulder function, return to working in the yard;    OBJECTIVE:   Patient Surveys  FOTO: 20, predicted improvement to 64 QuickDASH: Deferred  Cognition Patient is oriented to person, place, and time.  Recent memory is intact.  Remote memory is intact.  Attention span and concentration are intact.  Expressive speech is intact.  Patient's fund of knowledge is within normal limits for educational level.    Gross Musculoskeletal Assessment Tremor: None Bulk: Normal Tone: Normal Incision is dry and clean without excessive warmth, redness, or swelling noted around R shoulder;  Gait Deferred  Posture Forward head and rounded shoulders;  Cervical Screen AROM: WFL and painless with overpressure in all planes Spurlings A (ipsilateral lateral flexion/axial compression): R: Not examined L: Not examined Spurlings B (ipsilateral lateral flexion/contralateral rotation/axial compression): R: Not examined L: Not examined Repeated movement: No centralization or peripheralization with protraction or retraction Hoffman Sign (cervical cord compression): R: Not examined L: Not examined ULTT Median: R: Not examined L: Not examined ULTT Ulnar: R: Not examined L: Not examined ULTT Radial: R: Not examined L: Not examined  AROM  AROM (Normal range in degrees) AROM 10/11/2021  Cervical  Flexion (50) WFL  Extension (80) WFL  Right lateral flexion (45) WFL  Left lateral flexion (45) WFL  Right rotation (85) WFL  Left rotation (85) WFL   Right Left  Shoulder    Flexion Approx 60 (PROM)*   Extension    Abduction Approx 70 (PROM)*   External Rotation 0 (PROM)*   Internal Rotation Palm to stomach at neutral shoulder   Hands  Behind Head    Hands Behind Back  Elbow    Flexion Full (PROM)   Extension 0 (PROM)   Pronation Full (PROM)   Supination Full (PROM)   (* = pain; Blank rows = not tested)  UE MMT:  MMT (out of 5) Right 10/01/2021 Left 10/01/2021  Cervical (isometric)  Flexion   Extension   Lateral Flexion    Rotation        Shoulder   Flexion    Extension    Abduction    External rotation    Internal rotation    Horizontal abduction    Horizontal adduction    Lower Trapezius    Rhomboids        Elbow  Flexion    Extension    Pronation    Supination        Wrist  Flexion 5 5  Extension 5 5  Radial deviation 5 5  Ulnar deviation 5 5      MCP  Flexion 5 5  Extension 5 5  Abduction 5 5  Adduction 5 5  (* = pain; Blank rows = not tested)  Grip: Strong and roughly symmetrical BUE   Sensation Grossly intact to light touch bilateral UE as determined by testing dermatomes C2-T2. Proprioception and hot/cold testing deferred on this date.   Reflexes Deferred   Palpation  Location LEFT  RIGHT           Subocciptials    Cervical paraspinals    Upper Trapezius    Levator Scapulae    Rhomboid Major/Minor    Sternoclavicular joint    Acromioclavicular joint    Coracoid process    Long head of biceps    Supraspinatus    Infraspinatus    Subscapularis    Teres Minor    Teres Major    Pectoralis Major    Pectoralis Minor    Anterior Deltoid  1  Lateral Deltoid  0  Posterior Deltoid  0  Latissimus Dorsi    Sternocleidomastoid    (Blank rows = not tested) Graded on 0-4 scale (0 = no pain, 1 = pain, 2 = pain with wincing/grimacing/flinching, 3 = pain with withdrawal, 4 = unwilling to allow palpation), (Blank rows = not tested)  Repeated Movements Deferred  Passive Accessory Intervertebral Motion Deferred  Accessory Motions/Glides Muscle Length Testing: Deferred  Special Tests: Deferred  Beighton scale: Deferred    TODAY'S TREATMENT     SUBJECTIVE: Pt saw orthopedic surgeon who was very pleased with his range of motion and recommended decreasing therapy frequency to 1x/wk until his precautions are lifted. Surgeon advised him that he can spend more time out of the sling and start using his RUE to comb his hair and brush his teeth. He is scheduled for December to have his L shoulder surgery. He reports that his back continues to hurt and he has an upcoming appointment to see Dr. Holley Raring. He has had minimal R shoulder pain since the last therapy session and denies any resting pain upon arrival. No specific questions currently.   PAIN: No resting shoulder pain. Persistent low back pain;   Manual Therapy  Semirecumbent R elbow PROM and AROM flexion/extension within pain tolerable limit and protocol never pushing through resistance and constantly monitoring pain 2 x 10 each; Semirecumbent R shoulder PROM flexion within pain tolerable limit and protocol (approx. 90 degrees), never pushing through resistance and constantly monitoring pain; Semirecumbent R shoulder PROM scaption within pain tolerable limit and protocol (approx. 90 degrees), never pushing through resistance and constantly  monitoring pain; Semirecumbent R shoulder PROM ER/IR within pain tolerable limit and protocol (stomach to 30 degrees ER), never pushing through resistance and constantly monitoring pain; Gentle effleurage to L upper trap, levator scapula, and rhomboids Seated R upper trap stretch x 30s; Seated cervical L sidebending stretch x 30s; Donned sling and cold pack applied to R shoulder x 5 minutes (unbilled);   Ther-ex  Standing forward bending R shoulder pendulums flexion/extension, horiz abduction/adduction, and circles x multiple bouts each with verbal cues; R forearm AROM pronation/supination 2 x 15 each; R wrist extension with manual resistance from therapist 2 x 15; R wrist flexion with manual resistance form therapist 2 x 15; Discussed change in  frequency to 1x/wk per ortho recommendations;   PATIENT EDUCATION:  Education details: Pt educated throughout session about proper posture and technique with exercises. Improved exercise technique, movement at target joints, use of target muscles after min to mod verbal, visual, tactile cues.  Person educated: Patient Education method: Explanation, verbal cues, tactile cues Education comprehension: verbalized understanding and returned demonstration   HOME EXERCISE PROGRAM: Access Code: [email or web address] URL: [email or web address] Date: 10/01/2021 Prepared by: Roxana Hires  Exercises - Flexion-Extension Shoulder Pendulum with Table Support (Mirrored)  - 2-3 x daily - 7 x weekly - 3 sets - 10 reps - Horizontal Shoulder Pendulum with Table Support  - 2-3 x daily - 7 x weekly - 3 sets - 10 reps - Circular Shoulder Pendulum with Table Support  - 2-3 x daily - 7 x weekly - 3 sets - 10 reps - Seated Scapular Retraction  - 2 x daily - 7 x weekly - 2 sets - 10 reps - 3s hold - Seated Cervical Sidebending Stretch  - 2 x daily - 7 x weekly - 3 reps - 30s hold   ASSESSMENT:  CLINICAL IMPRESSION: Performed extensive R shoulder PROM within limitations of protocol and never pushing through pain/resistance. Pt continues to demonstrate improved ability to relax R shoulder muscles. Minimal spasm noted today. Repeated cervical stretches and continued STM for L upper quarter to decrease muscle tension. Progressed R elbow ROM to AROM as surgeon cleared him to spend more time out of the sling and use his RUE to brush his teeth and comb his hair. Frequency decreased to 1x/wk per surgeons recommendation until precautions are lifted. Pt encouraged to continue HEP and follow-up as scheduled. Patient will benefit from skilled PT to address above impairments and improve overall function.  REHAB POTENTIAL: Good  CLINICAL DECISION MAKING: Unstable/unpredictable  EVALUATION COMPLEXITY:  High   GOALS: Goals reviewed with patient? Yes  SHORT TERM GOALS: Target date: 11/08/2021  Pt will be independent with HEP to improve strength and decrease shoulder pain to improve pain-free function at home and when working in the yard Baseline:  Goal status: INITIAL   LONG TERM GOALS: Target date: 12/06/2021  Pt will increase FOTO to at least 54 to demonstrate significant improvement in function at home and work related to neck pain  Baseline: 09/30/21: 20 Goal status: INITIAL  2.  Pt will decrease worst shoulder pain by at least 3 points on the NPRS in order to demonstrate clinically significant reduction in shoulder pain. Baseline: 09/30/21: worst 10/10 Goal status: INITIAL  3.  Pt will decrease quick DASH score by at least 8% in order to demonstrate clinically significant reduction in disability related to shoulder pain        Baseline: 09/30/21: To be completed Goal status: INITIAL  4. Pt will increase strength of  R shoulder flexion and abduction to at least 4/5 in order to improve R shoulder strength and function so he can complete his household responsibilities and work in the yard      Baseline: 09/30/21: Unable to test; Goal status: INITIAL  5. Pt will increase R shoulder AROM flexion and abduction to at least 120 each in to improve R shoulder function so he can complete his household responsibilities and work in the yard      Baseline: 09/30/21: Unable to test; Goal status: INITIAL   PLAN: PT FREQUENCY: 1-2x/week  PT DURATION: 8 weeks  PLANNED INTERVENTIONS: Therapeutic exercises, Therapeutic activity, Neuromuscular re-education, Balance training, Gait training, Patient/Family education, Joint manipulation, Joint mobilization, Vestibular training, Canalith repositioning, Aquatic Therapy, Dry Needling, Electrical stimulation, Spinal manipulation, Spinal mobilization, Cryotherapy, Moist heat, Traction, Ultrasound, Ionotophoresis 25m/ml Dexamethasone, and Manual  therapy  PLAN FOR NEXT SESSION: Have pt complete DASH, review/modify HEP as necessary, progress strength/ROM per protocol;  JLyndel SafeHuprich PT, DPT, GCS  Cassanda Walmer 10/11/2021, 9:57 AM

## 2021-10-10 ENCOUNTER — Ambulatory Visit: Payer: Medicare Other

## 2021-10-10 DIAGNOSIS — G8929 Other chronic pain: Secondary | ICD-10-CM | POA: Diagnosis not present

## 2021-10-10 DIAGNOSIS — M75112 Incomplete rotator cuff tear or rupture of left shoulder, not specified as traumatic: Secondary | ICD-10-CM | POA: Diagnosis not present

## 2021-10-10 DIAGNOSIS — M25511 Pain in right shoulder: Secondary | ICD-10-CM | POA: Diagnosis not present

## 2021-10-10 DIAGNOSIS — M6281 Muscle weakness (generalized): Secondary | ICD-10-CM

## 2021-10-10 DIAGNOSIS — M75121 Complete rotator cuff tear or rupture of right shoulder, not specified as traumatic: Secondary | ICD-10-CM | POA: Diagnosis not present

## 2021-10-12 ENCOUNTER — Ambulatory Visit
Admission: RE | Admit: 2021-10-12 | Discharge: 2021-10-12 | Disposition: A | Discharge status: Home / Self Care | Payer: Medicare Other | Source: Ambulatory Visit | Attending: Student in an Organized Health Care Education/Training Program | Admitting: Student in an Organized Health Care Education/Training Program

## 2021-10-12 ENCOUNTER — Encounter: Payer: Self-pay | Admitting: Student in an Organized Health Care Education/Training Program

## 2021-10-12 ENCOUNTER — Ambulatory Visit
Payer: Medicare Other | Attending: Student in an Organized Health Care Education/Training Program | Admitting: Student in an Organized Health Care Education/Training Program

## 2021-10-12 DIAGNOSIS — M5416 Radiculopathy, lumbar region: Secondary | ICD-10-CM

## 2021-10-12 DIAGNOSIS — G8929 Other chronic pain: Secondary | ICD-10-CM

## 2021-10-12 MED ORDER — SODIUM CHLORIDE (PF) 0.9 % IJ SOLN
INTRAMUSCULAR | Status: AC
  Filled 2021-10-12: qty 10

## 2021-10-12 MED ORDER — DEXAMETHASONE SODIUM PHOSPHATE 10 MG/ML IJ SOLN
10.0000 mg | Freq: Once | INTRAMUSCULAR | Status: AC
  Administered 2021-10-12: 10 mg

## 2021-10-12 MED ORDER — ROPIVACAINE HCL 2 MG/ML IJ SOLN
INTRAMUSCULAR | Status: AC
  Filled 2021-10-12: qty 20

## 2021-10-12 MED ORDER — ROPIVACAINE HCL 2 MG/ML IJ SOLN
2.0000 mL | Freq: Once | INTRAMUSCULAR | Status: AC
  Administered 2021-10-12: 2 mL via EPIDURAL

## 2021-10-12 MED ORDER — IOHEXOL 180 MG/ML  SOLN
INTRAMUSCULAR | Status: AC
  Filled 2021-10-12: qty 20

## 2021-10-12 MED ORDER — LIDOCAINE HCL (PF) 2 % IJ SOLN
INTRAMUSCULAR | Status: AC
  Filled 2021-10-12: qty 10

## 2021-10-12 MED ORDER — IOHEXOL 180 MG/ML  SOLN
10.0000 mL | Freq: Once | INTRAMUSCULAR | Status: AC
  Administered 2021-10-12: 10 mL via EPIDURAL

## 2021-10-12 MED ORDER — SODIUM CHLORIDE 0.9% FLUSH
2.0000 mL | Freq: Once | INTRAVENOUS | Status: AC
  Administered 2021-10-12: 2 mL

## 2021-10-12 MED ORDER — DEXAMETHASONE SODIUM PHOSPHATE 10 MG/ML IJ SOLN
INTRAMUSCULAR | Status: AC
  Filled 2021-10-12: qty 1

## 2021-10-12 MED ORDER — LIDOCAINE HCL 2 % IJ SOLN
20.0000 mL | Freq: Once | INTRAMUSCULAR | Status: AC
  Administered 2021-10-12: 100 mg

## 2021-10-12 NOTE — Patient Instructions (Signed)
Pain Management Discharge Instructions  General Discharge Instructions :  If you need to reach your doctor call: Monday-Friday 8:00 am - 4:00 pm at [phone] or toll free [phone].  After clinic hours [phone] to have operator reach doctor.  Bring all of your medication bottles to all your appointments in the pain clinic.  To cancel or reschedule your appointment with Pain Management please remember to call 24 hours in advance to avoid a fee.  Refer to the educational materials which you have been given on: General Risks, I had my Procedure. Discharge Instructions, Post Sedation.  Post Procedure Instructions:  The drugs you were given will stay in your system until tomorrow, so for the next 24 hours you should not drive, make any legal decisions or drink any alcoholic beverages.  You may eat anything you prefer, but it is better to start with liquids then soups and crackers, and gradually work up to solid foods.  Please notify your doctor immediately if you have any unusual bleeding, trouble breathing or pain that is not related to your normal pain.  Depending on the type of procedure that was done, some parts of your body may feel week and/or numb.  This usually clears up by tonight or the next day.  Walk with the use of an assistive device or accompanied by an adult for the 24 hours.  You may use ice on the affected area for the first 24 hours.  Put ice in a Ziploc bag and cover with a towel and place against area 15 minutes on 15 minutes off.  You may switch to heat after 24 hours.Epidural Steroid Injection Patient Information  Description: The epidural space surrounds the nerves as they exit the spinal cord.  In some patients, the nerves can be compressed and inflamed by a bulging disc or a tight spinal canal (spinal stenosis).  By injecting steroids into the epidural space, we can bring irritated nerves into direct contact with a potentially helpful medication.  These  steroids act directly on the irritated nerves and can reduce swelling and inflammation which often leads to decreased pain.  Epidural steroids may be injected anywhere along the spine and from the neck to the low back depending upon the location of your pain.   After numbing the skin with local anesthetic (like Novocaine), a small needle is passed into the epidural space slowly.  You may experience a sensation of pressure while this is being done.  The entire block usually last less than 10 minutes.  Conditions which may be treated by epidural steroids:  Low back and leg pain Neck and arm pain Spinal stenosis Post-laminectomy syndrome Herpes zoster (shingles) pain Pain from compression fractures  Preparation for the injection:  Do not eat any solid food or dairy products within 8 hours of your appointment.  You may drink clear liquids up to 3 hours before appointment.  Clear liquids include water, black coffee, juice or soda.  No milk or cream please. You may take your regular medication, including pain medications, with a sip of water before your appointment  Diabetics should hold regular insulin (if taken separately) and take 1/2 normal NPH dos the morning of the procedure.  Carry some sugar containing items with you to your appointment. A driver must accompany you and be prepared to drive you home after your procedure.  Bring all your current medications with your. An IV may be inserted and sedation may be given at the discretion of the physician.     A blood pressure cuff, EKG and other monitors will often be applied during the procedure.  Some patients may need to have extra oxygen administered for a short period. You will be asked to provide medical information, including your allergies, prior to the procedure.  We must know immediately if you are taking blood thinners (like Coumadin/Warfarin)  Or if you are allergic to IV iodine contrast (dye). We must know if you could possible be  pregnant.  Possible side-effects: Bleeding from needle site Infection (rare, may require surgery) Nerve injury (rare) Numbness & tingling (temporary) Difficulty urinating (rare, temporary) Spinal headache ( a headache worse with upright posture) Light -headedness (temporary) Pain at injection site (several days) Decreased blood pressure (temporary) Weakness in arm/leg (temporary) Pressure sensation in back/neck (temporary)  Call if you experience: Fever/chills associated with headache or increased back/neck pain. Headache worsened by an upright position. New onset weakness or numbness of an extremity below the injection site Hives or difficulty breathing (go to the emergency room) Inflammation or drainage at the infection site Severe back/neck pain Any new symptoms which are concerning to you  Please note:  Although the local anesthetic injected can often make your back or neck feel good for several hours after the injection, the pain will likely return.  It takes 3-7 days for steroids to work in the epidural space.  You may not notice any pain relief for at least that one week.  If effective, we will often do a series of three injections spaced 3-6 weeks apart to maximally decrease your pain.  After the initial series, we generally will wait several months before considering a repeat injection of the same type.  If you have any questions, please call [phone] [facility] 

## 2021-10-12 NOTE — Progress Notes (Signed)
Safety precautions to be maintained throughout the outpatient stay will include: orient to surroundings, keep bed in low position, maintain call bell within reach at all times, provide assistance with transfer out of bed and ambulation.  

## 2021-10-12 NOTE — Progress Notes (Signed)
PROVIDER NOTE: Interpretation of information contained herein should be left to medically-trained personnel. Specific patient instructions are provided elsewhere under "Patient Instructions" section of medical record. This document was created in part using STT-dictation technology, any transcriptional errors that may result from this process are unintentional.  Patient: [name] Type: Established DOB: [birth year] MRN: [id number] PCP: Olin Hauser, DO  Service: Procedure DOS: 10/12/2021 Setting: Ambulatory Location: Ambulatory outpatient facility Delivery: Face-to-face Provider: Gillis Santa, MD Specialty: Interventional Pain Management Specialty designation: 09 Location: Outpatient facility Ref. Prov.: Gillis Santa, MD    Primary Reason for Visit: Interventional Pain Management Treatment. CC: Back Pain (lwo)   Procedure:           Type: Lumbar epidural steroid injection (LESI) (interlaminar) #1    Laterality: Left   Level:  L3-4 Level.  Imaging: Fluoroscopic guidance         Anesthesia: Local anesthesia (1-2% Lidocaine) DOS: 10/12/2021  Performed by: Gillis Santa, MD  Purpose: Diagnostic/Therapeutic Indications: Lumbar radicular pain of intraspinal etiology of more than 4 weeks that has failed to respond to conservative therapy and is severe enough to impact quality of life or function. 1. Chronic radicular lumbar pain   2. Lumbar radiculopathy    NAS-11 Pain score:   Pre-procedure: 10-Worst pain ever/10   Post-procedure: 3 /10      Patient stopped his Xarelto 3 days prior.  Position / Prep / Materials:  Position: Prone w/ head of the table raised (slight reverse trendelenburg) to facilitate breathing.  Prep solution: DuraPrep (Iodine Povacrylex [0.7% available iodine] and Isopropyl Alcohol, 74% w/w) Prep Area: Entire Posterior Lumbar Region from lower scapular tip down to mid buttocks area and from flank to flank. Materials:  Tray: Epidural tray Needle(s):   Type: Epidural needle (Tuohy) Gauge (G):  17 Length: Regular (3.5-in) Qty: 1  Pre-op H&P Assessment:  [name] is a 66 y.o. (year old), male patient, seen today for interventional treatment. He  has a past surgical history that includes Knee surgery (Right); Gastric bypass (01/17/2000); prostate seeding; Carpal tunnel release (Right, 12/23/2014); Carpal tunnel release (Left, 01/06/2015); Colonoscopy with propofol (N/A, 10/08/2017); Cardioversion (N/A, 05/15/2019); Cardioversion (N/A, 03/13/2014); Reverse shoulder arthroplasty (Right, 09/27/2021); and Bicept tenodesis (Right, 09/27/2021). [name] has a current medication list which includes the following prescription(s): amiodarone, aspirin ec, benazepril, buspirone, carboxymethylcellulose, cyanocobalamin, cyclobenzaprine, duloxetine, furosemide, [START ON 10/22/2021] hydrocodone-acetaminophen, ibuprofen, melatonin, metoprolol succinate, multivitamin with minerals, ondansetron, oxycodone, xarelto, trazodone, and amlodipine. His primarily concern today is the Back Pain (lwo)  Initial Vital Signs:  Pulse/HCG Rate:  ECG Heart Rate: 60 Temp:   Resp: 18 BP: 136/87 SpO2: 96 %  BMI: Estimated body mass index is 42.99 kg/m as calculated from the following:   Height as of this encounter: 6' (1.829 m).   Weight as of this encounter: 317 lb (143.8 kg).  Risk Assessment: Allergies: Reviewed. He has No Known Allergies.  Allergy Precautions: None required Coagulopathies: Reviewed. None identified.  Blood-thinner therapy: None at this time Active Infection(s): Reviewed. None identified. [name] is afebrile  Site Confirmation: [name] was asked to confirm the procedure and laterality before marking the site Procedure checklist: Completed Consent: Before the procedure and under the influence of no sedative(s), amnesic(s), or anxiolytics, the patient was informed of the treatment options, risks and possible complications. To fulfill our ethical  and legal obligations, as recommended by the American Medical Association's Code of Ethics, I have informed the patient of my clinical impression; the nature and purpose  of the treatment or procedure; the risks, benefits, and possible complications of the intervention; the alternatives, including doing nothing; the risk(s) and benefit(s) of the alternative treatment(s) or procedure(s); and the risk(s) and benefit(s) of doing nothing. The patient was provided information about the general risks and possible complications associated with the procedure. These may include, but are not limited to: failure to achieve desired goals, infection, bleeding, organ or nerve damage, allergic reactions, paralysis, and death. In addition, the patient was informed of those risks and complications associated to Spine-related procedures, such as failure to decrease pain; infection (i.e.: Meningitis, epidural or intraspinal abscess); bleeding (i.e.: epidural hematoma, subarachnoid hemorrhage, or any other type of intraspinal or peri-dural bleeding); organ or nerve damage (i.e.: Any type of peripheral nerve, nerve root, or spinal cord injury) with subsequent damage to sensory, motor, and/or autonomic systems, resulting in permanent pain, numbness, and/or weakness of one or several areas of the body; allergic reactions; (i.e.: anaphylactic reaction); and/or death. Furthermore, the patient was informed of those risks and complications associated with the medications. These include, but are not limited to: allergic reactions (i.e.: anaphylactic or anaphylactoid reaction(s)); adrenal axis suppression; blood sugar elevation that in diabetics may result in ketoacidosis or comma; water retention that in patients with history of congestive heart failure may result in shortness of breath, pulmonary edema, and decompensation with resultant heart failure; weight gain; swelling or edema; medication-induced neural toxicity; particulate matter  embolism and blood vessel occlusion with resultant organ, and/or nervous system infarction; and/or aseptic necrosis of one or more joints. Finally, the patient was informed that Medicine is not an exact science; therefore, there is also the possibility of unforeseen or unpredictable risks and/or possible complications that may result in a catastrophic outcome. The patient indicated having understood very clearly. We have given the patient no guarantees and we have made no promises. Enough time was given to the patient to ask questions, all of which were answered to the patient's satisfaction. Mr. Flessner has indicated that he wanted to continue with the procedure. Attestation: I, the ordering provider, attest that I have discussed with the patient the benefits, risks, side-effects, alternatives, likelihood of achieving goals, and potential problems during recovery for the procedure that I have provided informed consent. Date  Time: 10/12/2021 11:19 AM  Pre-Procedure Preparation:  Monitoring: As per clinic protocol. Respiration, ETCO2, SpO2, BP, heart rate and rhythm monitor placed and checked for adequate function Safety Precautions: Patient was assessed for positional comfort and pressure points before starting the procedure. Time-out: I initiated and conducted the "Time-out" before starting the procedure, as per protocol. The patient was asked to participate by confirming the accuracy of the "Time Out" information. Verification of the correct person, site, and procedure were performed and confirmed by me, the nursing staff, and the patient. "Time-out" conducted as per Joint Commission's Universal Protocol (UP.01.01.01). Time: 1150  Description/Narrative of Procedure:          Target: Epidural space via interlaminar opening, initially targeting the lower laminar border of the superior vertebral body. Region: Lumbar Approach: Percutaneous paravertebral  Rationale (medical necessity): procedure needed  and proper for the diagnosis and/or treatment of the patient's medical symptoms and needs. Procedural Technique Safety Precautions: Aspiration looking for blood return was conducted prior to all injections. At no point did we inject any substances, as a needle was being advanced. No attempts were made at seeking any paresthesias. Safe injection practices and needle disposal techniques used. Medications properly checked for expiration dates. SDV (single  dose vial) medications used. Description of the Procedure: Protocol guidelines were followed. The procedure needle was introduced through the skin, ipsilateral to the reported pain, and advanced to the target area. Bone was contacted and the needle walked caudad, until the lamina was cleared. The epidural space was identified using "loss-of-resistance technique" with 2-3 ml of PF-NaCl (0.9% NSS), in a 5cc LOR glass syringe.  6 cc solution made of 3 cc of preservative-free saline, 2 cc of 0.2% ropivacaine, 1 cc of Decadron 10 mg/cc.   Vitals:   10/12/21 1133 10/12/21 1150 10/12/21 1154  BP:  136/87 (!) 141/94  Resp:  18 16  SpO2:  96% 100%  Weight: (!) 317 lb (143.8 kg)    Height: 6' (1.829 m)      Start Time: 1150 hrs. End Time: 1153 hrs.  Imaging Guidance (Spinal):          Type of Imaging Technique: Fluoroscopy Guidance (Spinal) Indication(s): Assistance in needle guidance and placement for procedures requiring needle placement in or near specific anatomical locations not easily accessible without such assistance. Exposure Time: Please see nurses notes. Contrast: Before injecting any contrast, we confirmed that the patient did not have an allergy to iodine, shellfish, or radiological contrast. Once satisfactory needle placement was completed at the desired level, radiological contrast was injected. Contrast injected under live fluoroscopy. No contrast complications. See chart for type and volume of contrast used. Fluoroscopic Guidance: I was  personally present during the use of fluoroscopy. "Tunnel Vision Technique" used to obtain the best possible view of the target area. Parallax error corrected before commencing the procedure. "Direction-depth-direction" technique used to introduce the needle under continuous pulsed fluoroscopy. Once target was reached, antero-posterior, oblique, and lateral fluoroscopic projection used confirm needle placement in all planes. Images permanently stored in EMR. Interpretation: I personally interpreted the imaging intraoperatively. Adequate needle placement confirmed in multiple planes. Appropriate spread of contrast into desired area was observed. No evidence of afferent or efferent intravascular uptake. No intrathecal or subarachnoid spread observed. Permanent images saved into the patient's record.  Antibiotic Prophylaxis:   Anti-infectives (From admission, onward)    None      Indication(s): None identified  Post-operative Assessment:  Post-procedure Vital Signs:  Pulse/HCG Rate:  62 Temp:   Resp: 16 BP:  (!) 141/94 SpO2: 100 %  EBL: None  Complications: No immediate post-treatment complications observed by team, or reported by patient.  Note: The patient tolerated the entire procedure well. A repeat set of vitals were taken after the procedure and the patient was kept under observation following institutional policy, for this type of procedure. Post-procedural neurological assessment was performed, showing return to baseline, prior to discharge. The patient was provided with post-procedure discharge instructions, including a section on how to identify potential problems. Should any problems arise concerning this procedure, the patient was given instructions to immediately contact us, at any time, without hesitation. In any case, we plan to contact the patient by telephone for a follow-up status report regarding this interventional procedure.  Comments:  No additional relevant  information.  Plan of Care  Orders:  Orders Placed This Encounter  Procedures   DG PAIN CLINIC C-ARM 1-60 MIN NO REPORT    Intraoperative interpretation by procedural physician at [place].    Standing Status:   Standing    Number of Occurrences:   1    Order Specific Question:   Reason for exam:    Answer:   Assistance in needle guidance and  placement for procedures requiring needle placement in or near specific anatomical locations not easily accessible without such assistance.   Patient instructed to restart Xarelto tomorrow.  Medications ordered for procedure: Meds ordered this encounter  Medications   iohexol (OMNIPAQUE) 180 MG/ML injection 10 mL    Must be Myelogram-compatible. If not available, you may substitute with a water-soluble, non-ionic, hypoallergenic, myelogram-compatible radiological contrast medium.   lidocaine (XYLOCAINE) 2 % (with pres) injection 400 mg   sodium chloride flush (NS) 0.9 % injection 2 mL   ropivacaine (PF) 2 mg/mL (0.2%) (NAROPIN) injection 2 mL   dexamethasone (DECADRON) injection 10 mg   Medications administered: We administered iohexol, lidocaine, sodium chloride flush, ropivacaine (PF) 2 mg/mL (0.2%), and dexamethasone.  See the medical record for exact dosing, route, and time of administration.  Follow-up plan:   Return in about 4 weeks (around 11/09/2021) for Post Procedure Evaluation, virtual.       s/p lesi #1 (left L4/5) on 8/17, #2 on 10/21/2018 (left L3/4), #3 on 12/09/2018 (Left L3/4); 02/24/2019: Right wrist injection and left trigger finger (middle finger) injection.,  Bilateral wrist injection 10/15/2019, bilateral knee Hyalgan No. 1 01/26/1960. #2 02/23/2020, #3 03/24/2020.  Endorsing approximately 75 to 80% pain relief for bilateral knee pain.  Lumbar L3-5 RFA, Left: 02/16/21, Right 03/09/21: 90% pain relief. Left L3/4 ESI 10/12/21            Recent Visits Date Type Provider Dept  09/06/21 Office Visit Gillis Santa, MD  [facility]  08/23/21 Office Visit Gillis Santa, MD [facility]  07/28/21 Office Visit Gillis Santa, MD [facility]  Showing recent visits within past 90 days and meeting all other requirements Today's Visits Date Type Provider Dept  10/12/21 Procedure visit Gillis Santa, MD [facility]  Showing today's visits and meeting all other requirements Future Appointments Date Type Provider Dept  11/09/21 Appointment Gillis Santa, MD [facility]  11/15/21 Appointment Gillis Santa, MD [facility]  Showing future appointments within next 90 days and meeting all other requirements  Disposition: Discharge home  Discharge (Date  Time): 10/12/2021; 1200 hrs.   Primary Care Physician: Olin Hauser, DO Location: [facility] Note by: Gillis Santa, MD Date: 10/12/2021; Time: 12:00 PM  Disclaimer:  Medicine is not an exact science. The only guarantee in medicine is that nothing is guaranteed. It is important to note that the decision to proceed with this intervention was based on the information collected from the patient. The Data and conclusions were drawn from the patient's questionnaire, the interview, and the physical examination. Because the information was provided in large part by the patient, it cannot be guaranteed that it has not been purposely or unconsciously manipulated. Every effort has been made to obtain as much relevant data as possible for this evaluation. It is important to note that the conclusions that lead to this procedure are derived in large part from the available data. Always take into account that the treatment will also be dependent on availability of resources and existing treatment guidelines, considered by other Pain Management Practitioners as being common knowledge and practice, at the time of the intervention. For Medico-Legal purposes, it is also important to point out  that variation in procedural techniques and pharmacological choices are the acceptable norm. The indications, contraindications, technique, and results of the above procedure should only be interpreted and judged by a Board-Certified Interventional Pain Specialist with extensive familiarity and expertise in the same  exact procedure and technique.

## 2021-10-13 ENCOUNTER — Telehealth: Payer: Self-pay

## 2021-10-13 NOTE — Telephone Encounter (Signed)
Post procedure phone call.  Patient states he is doing good.  

## 2021-10-18 NOTE — Therapy (Unsigned)
OUTPATIENT PHYSICAL THERAPY SHOULDER TREATMENT   Patient Name: [name] MRN: [id number] DOB:[birth year], 66 y.o., male Today's Date: 10/20/2021   PT End of Session - 10/19/21 1437     Visit Number 5    Number of Visits 25    Date for PT Re-Evaluation 12/23/21    Authorization Type eval: 09/30/21    PT Start Time 1400    PT Stop Time 1445    PT Time Calculation (min) 45 min    Activity Tolerance Patient limited by pain    Behavior During Therapy [facility] for tasks assessed/performed            Past Medical History:  Diagnosis Date   Aortic atherosclerosis ([place])    Cardiomyopathy (in setting of Afib)    a.) TTE 12/26/2013: EF 45-50%, mild ant and antsept HK. mild MR. Mod dil LA. nl RV fxn. Rhythm was Afib; b.) TTE 07/04/2019: EF 55%, mid-apical anteroseptal HK, mild MAC, mild Ao sclerosis, G2DD, RVSP 45.3   Chronic pain syndrome    a.) followed by pain management   Chronic, continuous use of opioids    a.) hydrocodone/APAP 7.5/325 mg; followed by pain management   Coronary artery disease 11/06/2014   a.) cCTA 11/06/2014: Ca score 224 (all in pLAD) -- 74th percentile for age/sex matched control   Diverticulosis    DJD (degenerative joint disease) of knee    History of hiatal hernia    History of kidney stones 2012   Hyperlipidemia    Hyperplastic colon polyp    Hypertension    Internal hemorrhoids    Intervertebral disc disorder with radiculopathy of lumbosacral region    Long term current use FULL DOSE (325 mg) aspirin    Long term current use of amiodarone    Long term current use of antithrombotics/antiplatelets    a.) rivaroxaban   Lower extremity edema    Mixed hyperlipidemia    Myalgia due to statin    Osteoarthritis    PAF (paroxysmal atrial fibrillation) ([place])    a.) CHA2DS2VASc = 4 (age, HTN, CHF, vascular disease history);  b.) s/p DCCV 03/13/2014 (200 J x1); c.) s/p DCCV 05/15/2019 (150 J x 1, 200 J x2); d.) rate/rhythm maintained on oral amiodarone +  metoprolol succinate; chronically anticoagulated with rivaroxaban   Pernicious anemia    Prostate cancer ([place])    Pulmonary nodule, right    a. 10/2014 Cardiac CTA: 62m RLL nodule; b. 04/2015 CT Chest: stable 711mRLL nodule. No new nodules; 02/2017 CTA Chest: stable, benign, 14m38mLL pulm nodule.   Sleep difficulties    a.) takes melatonin + trazodone PRN   SVT (supraventricular tachycardia)    Tubular adenoma of colon    Past Surgical History:  Procedure Laterality Date   BICEPT TENODESIS Right 09/27/2021   Procedure: Right reverse shoulder arthroplasty, biceps tenodesis;  Surgeon: PatLeim FabryD;  Location: [facility] ORS;  Service: Orthopedics;  Laterality: Right;   CARDIOVERSION N/A 05/15/2019   Procedure: CARDIOVERSION;  Surgeon: GolMinna MerrittsD;  Location: [facility];  Service: Cardiovascular;  Laterality: N/A;   CARDIOVERSION N/A 03/13/2014   Procedure: CARDIOVERSION; [facility] Tim[facility]da [facility]   CARPAL TUNNEL RELEASE Right 12/23/2014   Procedure: CARPAL TUNNEL RELEASE;  Surgeon: HowEarnestine LeysD;  Location: [facility] OR[facility];  Service: Orthopedics;  Laterality: Right;   CARPAL TUNNEL RELEASE Left 01/06/2015   Procedure: CARPAL TUNNEL RELEASE;  Surgeon: HowEarnestine LeysD;  Location: [facility] ORS;  Service: Orthopedics;  Laterality: Left;  COLONOSCOPY WITH PROPOFOL N/A 10/08/2017   Procedure: COLONOSCOPY WITH PROPOFOL;  Surgeon: Lin Landsman, MD;  [facility];  Service: Gastroenterology;  Laterality: N/A;   GASTRIC BYPASS  01/17/2000   KNEE SURGERY Right    knee trauma x3   prostate seeding     REVERSE SHOULDER ARTHROPLASTY Right 09/27/2021   Procedure: Right reverse shoulder arthroplasty, biceps tenodesis;  Surgeon: Leim Fabry, MD;  Location: [facility] ORS;  Service: Orthopedics;  Laterality: Right;   Patient Active Problem List   Diagnosis Date Noted   S/p reverse total shoulder arthroplasty 09/27/2021   Lesion of bone of lumbosacral spine (L5) 05/12/2021    Localized osteoarthritis of shoulder regions, bilateral 03/29/2021   Chronic pain of both shoulders 03/29/2021   Drug-induced myopathy 02/21/2021   Trigger finger of right hand 11/15/2020   Prostate cancer ([place]) 07/26/2020   Insomnia 08/01/2019   Primary osteoarthritis of both wrists 02/17/2019   Trigger middle finger of left hand 02/17/2019   Chronic pain syndrome 02/17/2019   Chronic radicular lumbar pain 10/08/2018   Lumbar radiculopathy 10/08/2018   Lumbar degenerative disc disease 10/08/2018   Lumbar facet arthropathy 10/08/2018   Lumbar facet joint syndrome 10/08/2018   Intervertebral disc disorder with radiculopathy of lumbosacral region    Pernicious anemia 05/22/2016   Paroxysmal atrial fibrillation ([place]) 12/20/2015   DJD (degenerative joint disease) of knee 10/27/2014   Bilateral carpal tunnel syndrome 10/27/2014   Morbid obesity with BMI of 50.0-59.9, adult ([place]) 10/27/2014   Mixed hyperlipidemia 10/27/2014   H/O gastric bypass 03/20/2014   Hyperkalemia 03/20/2014   History of prostate cancer 12/26/2013   Essential hypertension 12/26/2013   Encounter for anticoagulation discussion and counseling 12/26/2013   PCP: Olin Hauser, DO  REFERRING PROVIDER: Leim Fabry, MD  REFERRING DIAGNOSIS: M75.121 (ICD-10-CM) - Complete rotator cuff tear or rupture of right shoulder, not specified as traumatic  THERAPY DIAG: Chronic right shoulder pain  Muscle weakness (generalized)  RATIONALE FOR EVALUATION AND TREATMENT: Rehabilitation  ONSET DATE: 09/27/21  FOLLOW UP APPT WITH PROVIDER: Yes, 10/10/21 at 10:15 with Dr. Posey Pronto   FROM INITIAL EVALUATION (09/30/21) SUBJECTIVE:                                                                                                                                                                                         Chief Complaint: Right reverse TSR and right biceps tenodesis on 09/27/21  Pertinent History Pt has a history  of chronic R shoulder pain and failed conservative management. Imaging was consistent with massive, irreparable rotator cuff tear. He underwent right reverse TSR and right biceps tenodesis on 09/27/21. Operative findings  include massive rotator cuff tear (complete supraspinatus, partial infraspinatus, complete subscapularis). No reported post-op complications with the exception of trying to coordinate proper medication management. He was advised by MD that he is allowed to remove his sling to perform pendulums and to bathe. He has been able to perform pendulums since hospital discharge. Pt reports being consistent with ice since discharge. He has been sleeping in chairs as well as in his bed since discharge. He reports using proper support for his RUE. He has a history of chronic back pain which has been worse since admission to the hospital. Pt is wanted to be able to have his L shoulder surgery by the end of this year.   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 Passive RoM allowed to 90 FF and 30 ER. General reverse TSR precautions include no combined shoulder extension, IR, and adduction. No shoulder and elbow AROM (bicep tenodesis) for at least the first 6 weeks to be further  assessed once protocol can be obtained;  Weight Bearing Restrictions: Yes no WB through [place] with: lives with their spouse, granddaughter over frequently (1 years old); Lives in: House/apartment   Patient Goals: Improve R shoulder function, return to working in the yard;  OBJECTIVE:   Patient Surveys  FOTO: 20, predicted improvement to 57 QuickDASH: Deferred  Cognition Patient is oriented to person, place, and time.  Recent memory is intact.  Remote memory is intact.  Attention span and concentration are intact.  Expressive speech is intact.  Patient's fund of knowledge is within normal limits for educational level.    Gross Musculoskeletal Assessment Tremor: None Bulk: Normal Tone: Normal Incision is dry and clean without excessive warmth, redness, or swelling noted around R shoulder;  Gait Deferred  Posture Forward head and rounded shoulders;  Cervical Screen AROM: WFL and painless with overpressure in all planes Spurlings A (ipsilateral lateral flexion/axial compression): R: Not examined L: Not examined Spurlings B (ipsilateral lateral flexion/contralateral rotation/axial compression): R: Not examined L: Not examined Repeated movement: No centralization or peripheralization with protraction or retraction Hoffman Sign (cervical cord compression): R: Not examined L: Not examined ULTT Median: R: Not examined L: Not examined ULTT Ulnar: R: Not examined L: Not examined ULTT Radial: R: Not examined L: Not examined  AROM  AROM (Normal range in degrees) AROM 10/20/2021  Cervical  Flexion (50) WFL  Extension (80) WFL  Right lateral flexion (45) WFL  Left lateral flexion (45) WFL  Right rotation (85) WFL  Left rotation (85) WFL   Right Left  Shoulder    Flexion Approx 60 (PROM)*   Extension    Abduction Approx 70 (PROM)*   External Rotation 0 (PROM)*   Internal Rotation Palm to stomach at neutral shoulder   Hands Behind Head    Hands  Behind Back        Elbow  Flexion Full (PROM)   Extension 0 (PROM)   Pronation Full (PROM)   Supination Full (PROM)   (* = pain; Blank rows = not tested)  UE MMT:  MMT (out of 5) Right 10/01/2021 Left 10/01/2021  Cervical (isometric)  Flexion   Extension   Lateral Flexion    Rotation        Shoulder   Flexion    Extension    Abduction    External rotation    Internal rotation    Horizontal abduction    Horizontal adduction    Lower Trapezius    Rhomboids        Elbow  Flexion    Extension    Pronation    Supination        Wrist  Flexion 5 5  Extension 5 5  Radial deviation 5 5  Ulnar deviation 5 5      MCP  Flexion 5 5  Extension 5 5  Abduction 5 5  Adduction 5 5  (* = pain; Blank rows = not tested)  Grip: Strong and roughly symmetrical BUE  Sensation Grossly intact to light touch bilateral UE as determined by testing dermatomes C2-T2. Proprioception and hot/cold testing deferred on this date.  Reflexes Deferred  Palpation  Location LEFT  RIGHT           Subocciptials    Cervical paraspinals    Upper Trapezius    Levator Scapulae    Rhomboid Major/Minor    Sternoclavicular joint    Acromioclavicular joint    Coracoid process    Long head of biceps    Supraspinatus    Infraspinatus    Subscapularis    Teres Minor    Teres Major    Pectoralis Major    Pectoralis Minor    Anterior Deltoid  1  Lateral Deltoid  0  Posterior Deltoid  0  Latissimus Dorsi    Sternocleidomastoid    (Blank rows = not tested) Graded on 0-4 scale (0 = no pain, 1 = pain, 2 = pain with wincing/grimacing/flinching, 3 = pain with withdrawal, 4 = unwilling to allow palpation), (Blank rows = not tested)  Repeated Movements Deferred  Passive Accessory Intervertebral Motion Deferred  Accessory Motions/Glides Muscle Length Testing: Deferred  Special Tests: Deferred  Beighton scale: Deferred   TODAY'S TREATMENT    SUBJECTIVE: Pt reports that he is doing  well today. He saw Dr. Holley Raring last week and had spinal injections which have helped considerably with his back pain. He denies any R shoulder pain at rest today. No specific questions currently.   PAIN: Denies shoulder pain   Manual Therapy  Semirecumbent R elbow PROM and AROM flexion/extension within pain tolerable limit and protocol never pushing through resistance and constantly monitoring pain 2 x 10 each; Semirecumbent R shoulder PROM flexion within pain tolerable limit and protocol (approx. 90 degrees), never pushing through resistance and constantly monitoring pain; Semirecumbent R shoulder PROM scaption within pain tolerable limit and protocol (approx. 90 degrees), never pushing through resistance and constantly monitoring pain; Semirecumbent R shoulder PROM ER/IR within pain tolerable limit and protocol (stomach to 30 degrees ER), never pushing through resistance and constantly monitoring pain; Gentle effleurage to L upper trap, levator scapula, and rhomboids Seated R upper trap stretch 2 x 30s; Seated cervical L sidebending stretch 2 x 30s; Donned sling and cold pack applied to R shoulder x 5 minutes (unbilled);   Ther-ex  R forearm AROM pronation/supination 2 x 20 each;  R wrist extension with manual resistance from therapist 2 x 20; R wrist flexion with manual resistance form therapist 2 x 20; Semirecumbent scapular retractions x 10;   Not performed: Standing forward bending R shoulder pendulums flexion/extension, horiz abduction/adduction, and circles x multiple bouts each with verbal cues;   PATIENT EDUCATION:  Education details: Pt educated throughout session about proper posture and technique with exercises. Improved exercise technique, movement at target joints, use of target muscles after min to mod verbal, visual, tactile cues.  Person educated: Patient Education method: Explanation, verbal cues, tactile cues Education comprehension: verbalized understanding and  returned demonstration   HOME EXERCISE PROGRAM: Access Code: [email or web address] URL: [email or web address] Date: 10/01/2021 Prepared by: Roxana Hires  Exercises - Flexion-Extension Shoulder Pendulum with Table Support (Mirrored)  - 2-3 x daily - 7 x weekly - 3 sets - 10 reps - Horizontal Shoulder Pendulum with Table Support  - 2-3 x daily - 7 x weekly - 3 sets - 10 reps - Circular Shoulder Pendulum with Table Support  - 2-3 x daily - 7 x weekly - 3 sets - 10 reps - Seated Scapular Retraction  - 2 x daily - 7 x weekly - 2 sets - 10 reps - 3s hold - Seated Cervical Sidebending Stretch  - 2 x daily - 7 x weekly - 3 reps - 30s hold   ASSESSMENT:  CLINICAL IMPRESSION: Performed extensive R shoulder PROM within limitations of protocol and never pushing through pain/resistance. Pt continues to demonstrate improved ability to relax R shoulder muscles. Minimal spasm noted today. Repeated cervical stretches and continued STM for R upper quarter to decrease muscle tension. Progressed R elbow ROM to AROM as surgeon cleared him to spend more time out of the sling and use his RUE to brush his teeth and comb his hair. Repetitions of exercises increased to 20 today. Frequency maintained at 1x/wk per surgeons recommendation until precautions are lifted. Pt encouraged to continue HEP and follow-up as scheduled. Patient will benefit from skilled PT to address above impairments and improve overall function.  REHAB POTENTIAL: Good  CLINICAL DECISION MAKING: Unstable/unpredictable  EVALUATION COMPLEXITY: High   GOALS: Goals reviewed with patient? Yes  SHORT TERM GOALS: Target date: 11/17/2021  Pt will be independent with HEP to improve strength and decrease shoulder pain to improve pain-free function at home and when working in the yard Baseline:  Goal status: INITIAL   LONG TERM GOALS: Target date: 12/15/2021  Pt will increase FOTO to at least 54 to demonstrate significant improvement in  function at home and work related to neck pain  Baseline: 09/30/21: 20 Goal status: INITIAL  2.  Pt will decrease worst shoulder pain by at least 3 points on the NPRS in order to demonstrate clinically significant reduction in shoulder pain. Baseline: 09/30/21: worst 10/10 Goal status: INITIAL  3.  Pt will decrease quick DASH score by at least 8% in order to demonstrate clinically significant reduction in disability related to shoulder pain        Baseline: 09/30/21: To be completed Goal status: INITIAL  4. Pt will increase strength of R shoulder flexion and abduction to at least 4/5 in order to improve R shoulder strength and function so he can complete his household responsibilities and work in the yard      Baseline: 09/30/21: Unable to test; Goal status: INITIAL  5. Pt will increase R shoulder AROM flexion and abduction to at least 120 each in to improve R shoulder function so he can  complete his household responsibilities and work in the yard      Baseline: 09/30/21: Unable to test; Goal status: INITIAL   PLAN: PT FREQUENCY: 1-2x/week  PT DURATION: 8 weeks  PLANNED INTERVENTIONS: Therapeutic exercises, Therapeutic activity, Neuromuscular re-education, Balance training, Gait training, Patient/Family education, Joint manipulation, Joint mobilization, Vestibular training, Canalith repositioning, Aquatic Therapy, Dry Needling, Electrical stimulation, Spinal manipulation, Spinal mobilization, Cryotherapy, Moist heat, Traction, Ultrasound, Ionotophoresis 47m/ml Dexamethasone, and Manual therapy  PLAN FOR NEXT SESSION: Have pt complete DASH, review/modify HEP as necessary, progress strength/ROM per protocol;  JLyndel SafeHuprich PT, DPT, GCS  Annalise Mcdiarmid 10/20/2021, 8:44 AM

## 2021-10-19 ENCOUNTER — Ambulatory Visit: Payer: Medicare Other | Attending: Orthopedic Surgery

## 2021-10-19 ENCOUNTER — Other Ambulatory Visit: Payer: Self-pay | Admitting: Student in an Organized Health Care Education/Training Program

## 2021-10-19 DIAGNOSIS — M25511 Pain in right shoulder: Secondary | ICD-10-CM | POA: Diagnosis not present

## 2021-10-19 DIAGNOSIS — M6281 Muscle weakness (generalized): Secondary | ICD-10-CM | POA: Diagnosis not present

## 2021-10-19 DIAGNOSIS — M19011 Primary osteoarthritis, right shoulder: Secondary | ICD-10-CM

## 2021-10-19 DIAGNOSIS — G894 Chronic pain syndrome: Secondary | ICD-10-CM

## 2021-10-19 DIAGNOSIS — G8929 Other chronic pain: Secondary | ICD-10-CM | POA: Insufficient documentation

## 2021-10-23 ENCOUNTER — Other Ambulatory Visit: Payer: Self-pay | Admitting: Family Medicine

## 2021-10-23 DIAGNOSIS — G4700 Insomnia, unspecified: Secondary | ICD-10-CM

## 2021-10-23 DIAGNOSIS — F419 Anxiety disorder, unspecified: Secondary | ICD-10-CM

## 2021-10-24 ENCOUNTER — Ambulatory Visit: Payer: Medicare Other

## 2021-10-24 NOTE — Telephone Encounter (Signed)
Requested Prescriptions  Pending Prescriptions Disp Refills  . traZODone (DESYREL) 150 MG tablet [Pharmacy Med Name: TRAZODONE 150 MG TAB[*]] 90 tablet 1    Sig: TAKE ONE TABLET BY MOUTH AT BEDTIME     Psychiatry: Antidepressants - Serotonin Modulator Passed - 10/23/2021  6:16 AM      Passed - Valid encounter within last 6 months    Recent Outpatient Visits          2 months ago Annual physical exam   [place], DO   5 months ago Lumbar degenerative disc disease   [place], DO   5 months ago [place], DO   7 months ago Primary insomnia   [place], DO   8 months ago Mixed hyperlipidemia   [place], DO      Future Appointments            In 1 week Gollan, Kathlene November, MD [place]. [place]   In 1 month Karamalegos, Devonne Doughty, DO [facility], [facility]           . busPIRone (BUSPAR) 10 MG tablet [Pharmacy Med Name: BUSPIRONE 10 MG TAB[*]] 360 tablet 1    Sig: TAKE ONE TABLET BY MOUTH FOUR TIMES A DAY AS NEEDED FOR ANXIETY     Psychiatry: Anxiolytics/Hypnotics - Non-controlled Passed - 10/23/2021  6:16 AM      Passed - Valid encounter within last 12 months    Recent Outpatient Visits          2 months ago Annual physical exam   [place], DO   5 months ago Lumbar degenerative disc disease   [place], DO   5 months ago [place], DO   7 months ago Primary insomnia   [place], DO   8 months ago Mixed hyperlipidemia   [place], DO      Future Appointments            In  1 week Gollan, Kathlene November, MD [place]. [place]   In 1 month Karamalegos, Devonne Doughty, DO [facility], [place]           . DULoxetine (CYMBALTA) 30 MG capsule [Pharmacy Med Name: DULOXETINE DR 30 MG CAP[*]] 90 capsule 1    Sig: TAKE ONE CAPSULE BY MOUTH ONE TIME DAILY     Psychiatry: Antidepressants - SNRI - duloxetine Failed - 10/23/2021  6:16 AM      Failed - Last BP in normal range    BP Readings from Last 1 Encounters:  10/12/21 (!) 141/94         Passed - Cr in normal range and within 360 days    Creat  Date Value Ref Range Status  07/27/2021 1.13 0.70 - 1.35 mg/dL Final   Creatinine, Ser  Date Value Ref Range Status  09/28/2021 0.96 0.61 - 1.24 mg/dL Final         Passed - eGFR is 30 or above and within 360 days    GFR, Est African American  Date Value Ref  Range Status  07/21/2020 72 > OR = 60 mL/min/1.37m Final   GFR, Est Non African American  Date Value Ref Range Status  07/21/2020 62 > OR = 60 mL/min/1.768mFinal   GFR, Estimated  Date Value Ref Range Status  09/28/2021 >60 >60 mL/min Final    Comment:    (NOTE) Calculated using the CKD-EPI Creatinine Equation (2021)          Passed - Completed PHQ-2 or PHQ-9 in the last 360 days      Passed - Valid encounter within last 6 months    Recent Outpatient Visits          2 months ago [place]   [place]   5 months ago Lumbar degenerative disc disease   [place]   5 months ago [place]   7 months ago Primary insomnia   [place]   8 months ago Mixed hyperlipidemia   [place]      Future Appointments            In 1 week [place]ollan, [place] In 1 month [facility]

## 2021-10-25 ENCOUNTER — Ambulatory Visit: Payer: Medicare Other

## 2021-10-25 DIAGNOSIS — G8929 Other chronic pain: Secondary | ICD-10-CM | POA: Diagnosis not present

## 2021-10-25 DIAGNOSIS — M25511 Pain in right shoulder: Secondary | ICD-10-CM | POA: Diagnosis not present

## 2021-10-25 DIAGNOSIS — M6281 Muscle weakness (generalized): Secondary | ICD-10-CM | POA: Diagnosis not present

## 2021-10-25 NOTE — Therapy (Signed)
OUTPATIENT PHYSICAL THERAPY SHOULDER TREATMENT   Patient Name: [name] MRN: [id number] DOB:1955-06-14, 66 y.o., male Today's Date: 10/25/2021   PT End of Session - 10/25/21 1535     Visit Number 6    Number of Visits 25    Date for PT Re-Evaluation 12/23/21    Authorization Type eval: 09/30/21    PT Start Time 1535    PT Stop Time 1606    PT Time Calculation (min) 31 min    Activity Tolerance Patient tolerated treatment well    Behavior During Therapy WFL for tasks assessed/performed            Past Medical History:  Diagnosis Date   Aortic atherosclerosis ([place])    Cardiomyopathy (in setting of Afib)    a.) TTE 12/26/2013: EF 45-50%, mild ant and antsept HK. mild MR. Mod dil LA. nl RV fxn. Rhythm was Afib; b.) TTE 07/04/2019: EF 55%, mid-apical anteroseptal HK, mild MAC, mild Ao sclerosis, G2DD, RVSP 45.3   Chronic pain syndrome    a.) followed by pain management   Chronic, continuous use of opioids    a.) hydrocodone/APAP 7.5/325 mg; followed by pain management   Coronary artery disease 11/06/2014   a.) cCTA 11/06/2014: Ca score 224 (all in pLAD) -- 74th percentile for age/sex matched control   Diverticulosis    DJD (degenerative joint disease) of knee    History of hiatal hernia    History of kidney stones 2012   Hyperlipidemia    Hyperplastic colon polyp    Hypertension    Internal hemorrhoids    Intervertebral disc disorder with radiculopathy of lumbosacral region    Long term current use FULL DOSE (325 mg) aspirin    Long term current use of amiodarone    Long term current use of antithrombotics/antiplatelets    a.) rivaroxaban   Lower extremity edema    Mixed hyperlipidemia    Myalgia due to statin    Osteoarthritis    PAF (paroxysmal atrial fibrillation) ([place])    a.) CHA2DS2VASc = 4 (age, HTN, CHF, vascular disease history);  b.) s/p DCCV 03/13/2014 (200 J x1); c.) s/p DCCV 05/15/2019 (150 J x 1, 200 J x2); d.) rate/rhythm maintained on oral amiodarone  + metoprolol succinate; chronically anticoagulated with rivaroxaban   Pernicious anemia    Prostate cancer ([place])    Pulmonary nodule, right    a. 10/2014 Cardiac CTA: 49m RLL nodule; b. 04/2015 CT Chest: stable 756mRLL nodule. No new nodules; 02/2017 CTA Chest: stable, benign, 26m726mLL pulm nodule.   Sleep difficulties    a.) takes melatonin + trazodone PRN   SVT (supraventricular tachycardia)    Tubular adenoma of colon    Past Surgical History:  Procedure Laterality Date   BICEPT TENODESIS Right 09/27/2021   Procedure: Right reverse shoulder arthroplasty, biceps tenodesis;  Surgeon: PatLeim FabryD;  Location: [facility];  Service: Orthopedics;  Laterality: Right;   CARDIOVERSION N/A 05/15/2019   Procedure: CARDIOVERSION;  Surgeon: GolMinna MerrittsD;  Location: [facility];  Service: Cardiovascular;  Laterality: N/A;   CARDIOVERSION N/A 03/13/2014   Procedure: CARDIOVERSION; [facility] TimIda RogueD   CARPAL TUNNEL RELEASE Right 12/23/2014   Procedure: CARPAL TUNNEL RELEASE;  Surgeon: HowEarnestine LeysD;  Location: [facility];  Service: Orthopedics;  Laterality: Right;   CARPAL TUNNEL RELEASE Left 01/06/2015   Procedure: CARPAL TUNNEL RELEASE;  Surgeon: HowEarnestine LeysD;  Location: [facility];  Service: Orthopedics;  Laterality: Left;  COLONOSCOPY WITH PROPOFOL N/A 10/08/2017   Procedure: COLONOSCOPY WITH PROPOFOL;  Surgeon: Lin Landsman, MD;  Location: [facility] ENDOSCOPY;  Service: Gastroenterology;  Laterality: N/A;   GASTRIC BYPASS  01/17/2000   KNEE SURGERY Right    knee trauma x3   prostate seeding     REVERSE SHOULDER ARTHROPLASTY Right 09/27/2021   Procedure: Right reverse shoulder arthroplasty, biceps tenodesis;  Surgeon: Leim Fabry, MD;  Location: [facility];  Service: Orthopedics;  Laterality: Right;   Patient Active Problem List   Diagnosis Date Noted   S/p reverse total shoulder arthroplasty 09/27/2021   Lesion of bone of lumbosacral spine (L5) 05/12/2021    Localized osteoarthritis of shoulder regions, bilateral 03/29/2021   Chronic pain of both shoulders 03/29/2021   Drug-induced myopathy 02/21/2021   Trigger finger of right hand 11/15/2020   Prostate cancer ([place]) 07/26/2020   Insomnia 08/01/2019   Primary osteoarthritis of both wrists 02/17/2019   Trigger middle finger of left hand 02/17/2019   Chronic pain syndrome 02/17/2019   Chronic radicular lumbar pain 10/08/2018   Lumbar radiculopathy 10/08/2018   Lumbar degenerative disc disease 10/08/2018   Lumbar facet arthropathy 10/08/2018   Lumbar facet joint syndrome 10/08/2018   Intervertebral disc disorder with radiculopathy of lumbosacral region    Pernicious anemia 05/22/2016   Paroxysmal atrial fibrillation ([place]) 12/20/2015   DJD (degenerative joint disease) of knee 10/27/2014   Bilateral carpal tunnel syndrome 10/27/2014   Morbid obesity with BMI of 50.0-59.9, adult ([place]) 10/27/2014   Mixed hyperlipidemia 10/27/2014   H/O gastric bypass 03/20/2014   Hyperkalemia 03/20/2014   History of prostate cancer 12/26/2013   Essential hypertension 12/26/2013   Encounter for anticoagulation discussion and counseling 12/26/2013   PCP: Olin Hauser, DO  REFERRING PROVIDER: Leim Fabry, MD  REFERRING DIAGNOSIS: M75.121 (ICD-10-CM) - Complete rotator cuff tear or rupture of right shoulder, not specified as traumatic  THERAPY DIAG: Chronic right shoulder pain  Muscle weakness (generalized)  RATIONALE FOR EVALUATION AND TREATMENT: Rehabilitation  ONSET DATE: 09/27/21  FOLLOW UP APPT WITH PROVIDER: Yes, 10/10/21 at 10:15 with Dr. Posey Pronto   FROM INITIAL EVALUATION (09/30/21) SUBJECTIVE:                                                                                                                                                                                         Chief Complaint: Right reverse TSR and right biceps tenodesis on 09/27/21  Pertinent History Pt has a history  of chronic R shoulder pain and failed conservative management. Imaging was consistent with massive, irreparable rotator cuff tear. He underwent right reverse TSR and right biceps tenodesis on 09/27/21. Operative findings  include massive rotator cuff tear (complete supraspinatus, partial infraspinatus, complete subscapularis). No reported post-op complications with the exception of trying to coordinate proper medication management. He was advised by MD that he is allowed to remove his sling to perform pendulums and to bathe. He has been able to perform pendulums since hospital discharge. Pt reports being consistent with ice since discharge. He has been sleeping in chairs as well as in his bed since discharge. He reports using proper support for his RUE. He has a history of chronic back pain which has been worse since admission to the hospital. Pt is wanted to be able to have his L shoulder surgery by the end of this year.   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 Passive RoM allowed to 90 FF and 30 ER. General reverse TSR precautions include no combined shoulder extension, IR, and adduction. No shoulder and elbow AROM (bicep tenodesis) for at least the first 6 weeks to be further  assessed once protocol can be obtained;  Weight Bearing Restrictions: Yes no WB through [place] with: lives with their spouse, granddaughter over frequently (96 years old); Lives in: House/apartment   Patient Goals: Improve R shoulder function, return to working in the yard;  OBJECTIVE:   Patient Surveys  FOTO: 20, predicted improvement to 28 QuickDASH: Deferred  Cognition Patient is oriented to person, place, and time.  Recent memory is intact.  Remote memory is intact.  Attention span and concentration are intact.  Expressive speech is intact.  Patient's fund of knowledge is within normal limits for educational level.    Gross Musculoskeletal Assessment Tremor: None Bulk: Normal Tone: Normal Incision is dry and clean without excessive warmth, redness, or swelling noted around R shoulder;  Gait Deferred  Posture Forward head and rounded shoulders;  Cervical Screen AROM: WFL and painless with overpressure in all planes Spurlings A (ipsilateral lateral flexion/axial compression): R: Not examined L: Not examined Spurlings B (ipsilateral lateral flexion/contralateral rotation/axial compression): R: Not examined L: Not examined Repeated movement: No centralization or peripheralization with protraction or retraction Hoffman Sign (cervical cord compression): R: Not examined L: Not examined ULTT Median: R: Not examined L: Not examined ULTT Ulnar: R: Not examined L: Not examined ULTT Radial: R: Not examined L: Not examined  AROM  AROM (Normal range in degrees) AROM 10/25/2021  Cervical  Flexion (50) WFL  Extension (80) WFL  Right lateral flexion (45) WFL  Left lateral flexion (45) WFL  Right rotation (85) WFL  Left rotation (85) WFL   Right Left  Shoulder    Flexion Approx 60 (PROM)*   Extension    Abduction Approx 70 (PROM)*   External Rotation 0 (PROM)*   Internal Rotation Palm to stomach at neutral shoulder   Hands Behind Head    Hands  Behind Back        Elbow  Flexion Full (PROM)   Extension 0 (PROM)   Pronation Full (PROM)   Supination Full (PROM)   (* = pain; Blank rows = not tested)  UE MMT:  MMT (out of 5) Right 10/01/2021 Left 10/01/2021  Cervical (isometric)  Flexion   Extension   Lateral Flexion    Rotation        Shoulder   Flexion    Extension    Abduction    External rotation    Internal rotation    Horizontal abduction    Horizontal adduction    Lower Trapezius    Rhomboids        Elbow  Flexion    Extension    Pronation    Supination        Wrist  Flexion 5 5  Extension 5 5  Radial deviation 5 5  Ulnar deviation 5 5      MCP  Flexion 5 5  Extension 5 5  Abduction 5 5  Adduction 5 5  (* = pain; Blank rows = not tested)  Grip: Strong and roughly symmetrical BUE  Sensation Grossly intact to light touch bilateral UE as determined by testing dermatomes C2-T2. Proprioception and hot/cold testing deferred on this date.  Reflexes Deferred  Palpation  Location LEFT  RIGHT           Subocciptials    Cervical paraspinals    Upper Trapezius    Levator Scapulae    Rhomboid Major/Minor    Sternoclavicular joint    Acromioclavicular joint    Coracoid process    Long head of biceps    Supraspinatus    Infraspinatus    Subscapularis    Teres Minor    Teres Major    Pectoralis Major    Pectoralis Minor    Anterior Deltoid  1  Lateral Deltoid  0  Posterior Deltoid  0  Latissimus Dorsi    Sternocleidomastoid    (Blank rows = not tested) Graded on 0-4 scale (0 = no pain, 1 = pain, 2 = pain with wincing/grimacing/flinching, 3 = pain with withdrawal, 4 = unwilling to allow palpation), (Blank rows = not tested)  Repeated Movements Deferred  Passive Accessory Intervertebral Motion Deferred  Accessory Motions/Glides Muscle Length Testing: Deferred  Special Tests: Deferred  Beighton scale: Deferred   TODAY'S TREATMENT    SUBJECTIVE: Pt reports his shoulder has  not had any pain today it feels good. He does endorse 3 instances of feeling his hardware dislocate anteriorly. He has not told his surgeon or primary PT. Last time was last night on 10/24/21.  Last night pt walking in his home with the brace off. Each time he has not had any RUE motion causing dislocation, its with his arm at rest by his side. Reports with each instance it felt fully dislocated the first time and each time after it was less and less . Each time he has felt his hardware go back into place each time laying down or placing arm against wall in standing. Pt denies sensory changes, color changes, temperature changes in RUE. No focal weakness or nerve/vascular issues.     PAIN: Denies shoulder pain    There.ex:   Palpable radial pulse on RUE. No subluxation notable or palpable compared to LUE. Good color and perfusion to limb equal to LUE.   R elbow AROM flexion/extension/pronation supination: 2x12, no pain R wrist flexion/extension AROM: x20/direction   R shoulder forward flexion to 90 deg PROM: 5 minutes  total, ~20 reps R shoulder scaption to 90 deg PROM: 5 minutes total, ~20 reps R shoulder ER/IR: stomach to 30 deg: 5 minutes total, ~20 reps   Seated 2# DB R wrist exercises with R forearm and shoulder supported:   Flexion/extension: x20/direction  Avoiding pronation and supination under resistance due to tenodesis    PATIENT EDUCATION:  Education details: Pt educated throughout session about proper posture and technique with exercises. Improved exercise technique, movement at target joints, use of target muscles after min to mod verbal, visual, tactile cues.  Person educated: Patient Education method: Explanation, verbal cues, tactile cues Education comprehension: verbalized understanding and returned demonstration   HOME EXERCISE PROGRAM: Access Code: [email or web address] URL: [email or web address] Date: 10/01/2021 Prepared by: Roxana Hires  Exercises -  Flexion-Extension Shoulder Pendulum with Table Support (Mirrored)  - 2-3 x daily - 7 x weekly - 3 sets - 10 reps - Horizontal Shoulder Pendulum with Table Support  - 2-3 x daily - 7 x weekly - 3 sets - 10 reps - Circular Shoulder Pendulum with Table Support  - 2-3 x daily - 7 x weekly - 3 sets - 10 reps - Seated Scapular Retraction  - 2 x daily - 7 x weekly - 2 sets - 10 reps - 3s hold - Seated Cervical Sidebending Stretch  - 2 x daily - 7 x weekly - 3 reps - 30s hold   ASSESSMENT:  CLINICAL IMPRESSION: Session shortened today due to reports of multiple dislocations to shoulder since surgery that he has not reported to anyone until today. Hardware and shoulder appears intact this date. Red flag questions negative. Education provided on need to update surgeon ASAP. Education provided on red flag signs/symptoms to be aware of. Mobility remains excellent at current stage of protocol with no pain or issues with dislocation. PT conservative this date not going to 90 deg but close to and avoiding close to 30 deg ER based off of subjective reports. Will continue POC with hopeful update from pt on surgeon for any changes in [place] pending reports of dislocations.   REHAB POTENTIAL: Good  CLINICAL DECISION MAKING: Unstable/unpredictable  EVALUATION COMPLEXITY: High   GOALS: Goals reviewed with patient? Yes  SHORT TERM GOALS: Target date: 11/22/2021  Pt will be independent with HEP to improve strength and decrease shoulder pain to improve pain-free function at home and when working in the yard Baseline:  Goal status: INITIAL   LONG TERM GOALS: Target date: 12/20/2021  Pt will increase FOTO to at least 54 to demonstrate significant improvement in function at home and work related to neck pain  Baseline: 09/30/21: 20 Goal status: INITIAL  2.  Pt will decrease worst shoulder pain by at least 3 points on the NPRS in order to demonstrate clinically significant reduction in shoulder pain. Baseline:  09/30/21: worst 10/10 Goal status: INITIAL  3.  Pt will decrease quick DASH score by at least 8% in order to demonstrate clinically significant reduction in disability related to shoulder pain        Baseline: 09/30/21: To be completed Goal status: INITIAL  4. Pt will increase strength of R shoulder flexion and abduction to at least 4/5 in order to improve R shoulder strength and function so he can complete his household responsibilities and work in the yard      Baseline: 09/30/21: Unable to test; Goal status: INITIAL  5. Pt will increase R shoulder AROM flexion and abduction to at least 120 each in to improve R shoulder function  so he can complete his household responsibilities and work in the yard      Baseline: 09/30/21: Unable to test; Goal status: INITIAL   PLAN: PT FREQUENCY: 1-2x/week  PT DURATION: 8 weeks  PLANNED INTERVENTIONS: Therapeutic exercises, Therapeutic activity, Neuromuscular re-education, Balance training, Gait training, Patient/Family education, Joint manipulation, Joint mobilization, Vestibular training, Canalith repositioning, Aquatic Therapy, Dry Needling, Electrical stimulation, Spinal manipulation, Spinal mobilization, Cryotherapy, Moist heat, Traction, Ultrasound, Ionotophoresis 79m/ml Dexamethasone, and Manual therapy  PLAN FOR NEXT SESSION: Ask if any MD updates or new dislocation issues. Continue POC with caution.   MSalem Caster Fairly IV, PT, DPT Physical Therapist- [facility] 10/25/2021, 4:13 PM

## 2021-10-27 DIAGNOSIS — M75121 Complete rotator cuff tear or rupture of right shoulder, not specified as traumatic: Secondary | ICD-10-CM | POA: Diagnosis not present

## 2021-10-31 ENCOUNTER — Other Ambulatory Visit: Payer: Self-pay | Admitting: Orthopedic Surgery
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
## 2021-11-01 ENCOUNTER — Encounter: Payer: Self-pay | Admitting: Cardiovascular Disease

## 2021-11-01 ENCOUNTER — Ambulatory Visit: Payer: Medicare Other | Attending: Cardiovascular Disease | Admitting: Cardiovascular Disease

## 2021-11-01 VITALS — BP 120/80 | HR 53 | Ht 72.0 in | Wt 321.2 lb

## 2021-11-01 DIAGNOSIS — I10 Essential (primary) hypertension: Secondary | ICD-10-CM

## 2021-11-01 DIAGNOSIS — M791 Myalgia, unspecified site: Secondary | ICD-10-CM | POA: Diagnosis not present

## 2021-11-01 DIAGNOSIS — T466X5D Adverse effect of antihyperlipidemic and antiarteriosclerotic drugs, subsequent encounter: Secondary | ICD-10-CM

## 2021-11-01 DIAGNOSIS — I2510 Atherosclerotic heart disease of native coronary artery without angina pectoris: Secondary | ICD-10-CM | POA: Diagnosis not present

## 2021-11-01 DIAGNOSIS — I2584 Coronary atherosclerosis due to calcified coronary lesion: Secondary | ICD-10-CM

## 2021-11-01 DIAGNOSIS — I4819 Other persistent atrial fibrillation: Secondary | ICD-10-CM

## 2021-11-01 DIAGNOSIS — E782 Mixed hyperlipidemia: Secondary | ICD-10-CM

## 2021-11-01 DIAGNOSIS — T466X5A Adverse effect of antihyperlipidemic and antiarteriosclerotic drugs, initial encounter: Secondary | ICD-10-CM

## 2021-11-01 MED ORDER — REPATHA 140 MG/ML ~~LOC~~ SOSY: 1.0000 "pen " | PREFILLED_SYRINGE | SUBCUTANEOUS | 11 refills | Status: DC

## 2021-11-01 NOTE — Patient Instructions (Addendum)
Medication Instructions:  Repatha 140 mg sq every 2 weeks for cholesterol  Use [facility] card for Xarelto cost savings  If you need a refill on your cardiac medications before your next appointment, please call your pharmacy.   Lab work: No new labs needed  Testing/Procedures: No new testing needed  Follow-Up: At [facility], you and your health needs are our priority.  As part of our continuing mission to provide you with exceptional heart care, we have created designated Provider Care Teams.  These Care Teams include your primary Cardiologist (physician) and Advanced Practice Providers (APPs -  Physician Assistants and Nurse Practitioners) who all work together to provide you with the care you need, when you need it.  You will need a follow up appointment in 12 months  Providers on your designated Care Team:   Murray Hodgkins, NP Christell Faith, PA-C Cadence Kathlen Mody, [place]  COVID-19 Vaccine Information can be found at: [email or web address] For questions related to vaccine distribution or appointments, please email [email or web address] or call [phone].

## 2021-11-02 ENCOUNTER — Telehealth: Payer: Self-pay

## 2021-11-02 NOTE — Telephone Encounter (Signed)
Prior Authorization for Repatha initiated through [email or web address].   KEY: [id number]  Response: [employer] is reviewing your PA request. Typically an electronic response will be received within 24-72 hours.

## 2021-11-03 ENCOUNTER — Encounter: Payer: Self-pay | Admitting: Urgent Care

## 2021-11-03 ENCOUNTER — Other Ambulatory Visit: Payer: Self-pay

## 2021-11-03 ENCOUNTER — Encounter
Admission: RE | Admit: 2021-11-03 | Discharge: 2021-11-03 | Disposition: A | Discharge status: Home / Self Care | Payer: Medicare Other | Source: Ambulatory Visit | Attending: Orthopedic Surgery | Admitting: Orthopedic Surgery

## 2021-11-03 DIAGNOSIS — Z01812 Encounter for preprocedural laboratory examination: Secondary | ICD-10-CM | POA: Insufficient documentation

## 2021-11-03 LAB — TYPE AND SCREEN
ABO/RH(D): O NEG
Antibody Screen: NEGATIVE

## 2021-11-03 NOTE — Patient Instructions (Addendum)
Your procedure is scheduled on: 11/08/21 - Tuesday Report to the Registration Desk on the 1st floor of the [facility]. To find out your arrival time, please call [phone] between 1PM - 3PM on: 11/07/21 - Monday If your arrival time is 6:00 am, do not arrive prior to that time as the [facility] entrance doors do not open until 6:00 am.  REMEMBER: Instructions that are not followed completely may result in serious medical risk, up to and including death; or upon the discretion of your surgeon and anesthesiologist your surgery may need to be rescheduled.  Do not eat food after midnight the night before surgery.  No gum chewing, lozengers or hard candies.  You may however, drink CLEAR liquids up to 2 hours before you are scheduled to arrive for your surgery. Do not drink anything within 2 hours of your scheduled arrival time.  Clear liquids include: - water  - apple juice without pulp - gatorade (not RED colors) - black coffee or tea (Do NOT add milk or creamers to the coffee or tea) Do NOT drink anything that is not on this list.  In addition, your doctor has ordered for you to drink the provided  Ensure Pre-Surgery Clear Carbohydrate Drink  Drinking this carbohydrate drink up to two hours before surgery helps to reduce insulin resistance and improve patient outcomes. Please complete drinking 2 hours prior to scheduled arrival time.  TAKE THESE MEDICATIONS THE MORNING OF SURGERY WITH A SIP OF WATER:  - amiodarone (PACERONE) - busPIRone (BUSPAR)  - DULoxetine (CYMBALTA) - HYDROcodone-acetaminophen if needed  Hold rivaroxaban (XARELTO)  11/05/21.  Hold  aspirin EC 325 MG beginning 11/04/21  One week prior to surgery: Stop Anti-inflammatories (NSAIDS) such as Advil, Aleve, Ibuprofen, Motrin, Naproxen, Naprosyn and Aspirin based products such as Excedrin, Goodys Powder, BC Powder.  Stop ANY OVER THE COUNTER supplements until after surgery.  You may however, continue to take  Tylenol if needed for pain up until the day of surgery.  No Alcohol for 24 hours before or after surgery.  No Smoking including e-cigarettes for 24 hours prior to surgery.  No chewable tobacco products for at least 6 hours prior to surgery.  No nicotine patches on the day of surgery.  Do not use any "recreational" drugs for at least a week prior to your surgery.  Please be advised that the combination of cocaine and anesthesia may have negative outcomes, up to and including death. If you test positive for cocaine, your surgery will be cancelled.  On the morning of surgery brush your teeth with toothpaste and water, you may rinse your mouth with mouthwash if you wish. Do not swallow any toothpaste or mouthwash.  Use CHG Soap or wipes as directed on instruction sheet.  Do not wear jewelry, make-up, hairpins, clips or nail polish.  Do not wear lotions, powders, or perfumes.   Do not shave body from the neck down 48 hours prior to surgery just in case you cut yourself which could leave a site for infection.  Also, freshly shaved skin may become irritated if using the CHG soap.  Contact lenses, hearing aids and dentures may not be worn into surgery.  Do not bring valuables to the hospital. [facility] is not responsible for any missing/lost belongings or valuables.   Total Shoulder Arthroplasty:  use Benzolyl Peroxide 5% Gel as directed on instruction sheet.  Notify your doctor if there is any change in your medical condition (cold, fever, infection).  Wear  comfortable clothing (specific to your surgery type) to the hospital.  After surgery, you can help prevent lung complications by doing breathing exercises.  Take deep breaths and cough every 1-2 hours. Your doctor may order a device called an Incentive Spirometer to help you take deep breaths. When coughing or sneezing, hold a pillow firmly against your incision with both hands. This is called "splinting." Doing this helps protect  your incision. It also decreases belly discomfort.  If you are being admitted to the hospital overnight, leave your suitcase in the car. After surgery it may be brought to your room.  If you are being discharged the day of surgery, you will not be allowed to drive home. You will need a responsible adult (18 years or older) to drive you home and stay with you that night.   If you are taking public transportation, you will need to have a responsible adult (18 years or older) with you. Please confirm with your physician that it is acceptable to use public transportation.   Please call the [place] Dept. at [phone] if you have any questions about these instructions.  Surgery Visitation Policy:  Patients undergoing a surgery or procedure may have two family members or support persons with them as long as the person is not COVID-19 positive or experiencing its symptoms.   Inpatient Visitation:    Visiting hours are 7 a.m. to 8 p.m. Up to four visitors are allowed at one time in a patient room, including children. The visitors may rotate out with other people during the day. One designated support person (adult) may remain overnight.

## 2021-11-03 NOTE — Progress Notes (Signed)
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 Clinical nursing staff has updated patient's PMH/PSHx, current medication list, and drug allergies/intolerances to ensure comprehensive history available to assist in medical decision making as it pertains to the aforementioned surgical procedure and anticipated anesthetic course. Extensive review of available clinical information performed. [name] PMH and PSHx updated with any diagnoses/procedures that  may have been inadvertently omitted during his intake with the pre-admission testing department's nursing staff.  Clinical Discussion:  [name] is a 66 y.o. male who is submitted for pre-surgical anesthesia review and clearance prior to him undergoing the above procedure. Patient is a Former Smoker (10 pack years; quit 02/1979). Pertinent PMH includes: CAD, cardiomyopathy, PAF, aortic atherosclerosis, SVT, HTN, HLD, hiatal hernia, pernicious anemia, prostate cancer, OA, DJD, lumbosacral DDD, chronic pain syndrome, chronic opioid use, sleep difficulties.  Patient is followed by cardiology Rockey Situ, MD). He was last seen in the cardiology clinic on 11/01/2021; notes reviewed. At the time of this clinic visit, patient doing well overall from a cardiovascular perspective.  He denied any episodes of chest pain, short of breath, PND, orthopnea, significant peripheral edema, vertiginous  symptoms, or presyncope/syncope.  Patient with intermittent episodes of palpitations and elevated blood pressure secondary to situational stress.  Patient with a past medical history significant for cardiovascular diagnoses.   Patient with a history of cardiomyopathy in the setting of known atrial fibrillation.  TTE performed on 12/26/2013 revealed a mildly reduced left ventricular systolic function with an EF of 45-50%.  There was mild anterior and anteroseptal hypokinesis.  Left atrium was mildly dilated.  Right ventricular systolic function normal.  There was mild mitral valve regurgitation.  There was no evidence of a significant transvalvular gradient to suggest stenosis.  Patient underwent a DCCV procedure on 03/13/2014, at which time he received a single 200 J biphasic cardioversion restoring NSR.  Cardiac CTA performed on 11/06/2014 revealed an elevated coronary calcium score of 224.  All coronary calcium noted to be in the proximal LAD distribution.  Calcium score in place patient in the 74th percentile for age and sex matched control.  Patient developed refractory atrial fibrillation.  He underwent a repeat DCCV procedure on 05/15/2019.  With this procedure, patient received multiple cardioversion attempts (150 J x 1 and 200 J x 2) prior to converting to NSR.  Most recent TTE was performed on 07/04/2019 revealing a normal left ventricular systolic function with an EF of 55%.  There was mid apical anteroseptal hypokinesis.  Diastolic Doppler parameters consistent with pseudonormalization (G2DD). Mild mitral annular calcification and aortic valve sclerosis noted.  There was no evidence of a significant transvalvular gradient to suggest stenosis.  RVSP mildly elevated at 25.3 mmHg.  Patient with an atrial fibrillation diagnosis; CHA2DS2-VASc Score = 4 (age, HTN, CHF, vascular disease history). His rate and rhythm are currently being maintained on oral amiodarone + metoprolol succinate. He is  chronically anticoagulated using rivaroxaban; reported to be compliant with therapy with no  evidence or reports of GI bleeding.  Blood pressure mildly elevated at 120/80 mmHg on currently prescribed CCB (amlodipine), ACEi (benazepril), diuretic (furosemide), and beta-blocker (metoprolol succinate) therapies.  Patient formally on rosuvastatin + ezetimibe for his HLD diagnosis and ASCVD prevention.  Patient self discontinued these medications secondary to associated myalgias.  HLD diagnosis currently being managed with diet lifestyle modification alone. Started on PCSK9i (evolocumab). He is not diabetic.  Patient does not have an OSAH diagnosis. Functional capacity, as defined by DASI, is documented as being >/= 4 METS.  No other changes were made to his medication regimen.  Patient to follow-up with outpatient cardiology in 1 year or sooner if needed.  [name] is scheduled for an elective RIGHT REVISION REVERSE SHOULDER ARTHROPLASTY on 03/11/2021 with Dr. Leim Fabry, MD.  Given patient's past medical history significant for cardiovascular diagnoses, presurgical cardiac clearance was sought by the PAT team. Per cardiology, "[name]'s perioperative risk of a major cardiac event is 0.9% according to the RCRI, therefore, he is at low risk for perioperative complications. His functional capacity is good at 6.55 METs according to the DASI. According to ACC/AHA guidelines, no further cardiovascular testing needed.  The patient may proceed to surgery at LOW/ACCEPTABLE risk".  Again, this patient is on both anticoagulation and antiplatelet therapies.  Patient has been instructed on recommendations from his cardiologist for holding his rivaroxaban dose for 3 days prior to his procedure with plans to restart medications as soon as postoperatively respectively minimized by primary attending surgeon.  He is aware that his last dose of rivaroxaban should be on 11/04/2021  Patient denies previous perioperative  complications with anesthesia in the past. In review of the available records, it is noted that patient underwent a general anesthetic course here at [facility] ([facility] in 09/2021 without documented complications.      11/03/2021    8:06 AM 11/01/2021   11:46 AM 10/12/2021   11:54 AM  Vitals with BMI  Height _0  _1    Weight 326 lbs 1 oz 321 lbs 4 oz   BMI 79.89 21.19   Systolic 417 408 144  Diastolic 76 80 94  Pulse 52 53     Providers/Specialists:   NOTE: Primary physician provider listed below. Patient may have been seen by APP or partner within same practice.   PROVIDER ROLE / SPECIALTY LAST Rossie Muskrat, MD Orthopedics (Surgeon) 10/31/2021  Olin Hauser, DO Primary Care Provider 08/03/2021  Ida Rogue, MD Cardiology 11/01/2021  Gillis Santa, MD Pain Management 10/12/2021   Allergies:  Patient has no known allergies.  Current Home Medications:   No current facility-administered medications for this encounter.    amiodarone (PACERONE) 200 MG tablet   amLODipine (NORVASC) 5 MG tablet   aspirin EC 325 MG tablet   benazepril (LOTENSIN) 40 MG tablet   busPIRone (BUSPAR) 10 MG tablet   carboxymethylcellulose (REFRESH PLUS) 0.5 % SOLN   cyanocobalamin (VITAMIN B12) 1000 MCG/ML injection   cyclobenzaprine (FLEXERIL) 10 MG tablet   DULoxetine (CYMBALTA) 30 MG capsule   Evolocumab (REPATHA) 140 MG/ML SOSY   HYDROcodone-acetaminophen (NORCO) 7.5-325 MG tablet   ibuprofen (ADVIL) 200 MG tablet   melatonin 3 MG TABS tablet   metoprolol succinate (TOPROL-XL) 50 MG 24 hr tablet   Multiple Vitamin (MULTIVITAMIN WITH MINERALS) TABS tablet   ondansetron (ZOFRAN) 4 MG tablet   oxyCODONE (OXY IR/ROXICODONE) 5 MG immediate release tablet   rivaroxaban (XARELTO) 20 MG  TABS tablet   traZODone (DESYREL) 150 MG tablet   History:   Past Medical History:  Diagnosis Date   Aortic atherosclerosis ([place])    Cardiomyopathy (in  setting of Afib)    a.) TTE 12/26/2013: EF 45-50%, mild ant and antsept HK. mild MR. Mod dil LA. nl RV fxn. Rhythm was Afib; b.) TTE 07/04/2019: EF 55%, mid-apical anteroseptal HK, mild MAC, mild Ao sclerosis, G2DD, RVSP 45.3   Chronic pain syndrome    a.) followed by pain management   Chronic, continuous use of opioids    a.) hydrocodone/APAP 7.5/325 mg; followed by pain management   Coronary artery disease 11/06/2014   a.) cCTA 11/06/2014: Ca score 224 (all in pLAD) -- 74th percentile for age/sex matched control   Diverticulosis    DJD (degenerative joint disease) of knee    History of hiatal hernia    History of kidney stones 2012   Hyperlipidemia    Hyperplastic colon polyp    Hypertension    Internal hemorrhoids    Intervertebral disc disorder with radiculopathy of lumbosacral region    Long term current use FULL DOSE (325 mg) aspirin    Long term current use of amiodarone    Long term current use of antithrombotics/antiplatelets    a.) rivaroxaban   Lower extremity edema    Mixed hyperlipidemia    Myalgia due to statin    Osteoarthritis    PAF (paroxysmal atrial fibrillation) ([place])    a.) CHA2DS2VASc = 4 (age, HTN, CHF, vascular disease history);  b.) s/p DCCV 03/13/2014 (200 J x1); c.) s/p DCCV 05/15/2019 (150 J x 1, 200 J x2); d.) rate/rhythm maintained on oral amiodarone + metoprolol succinate; chronically anticoagulated with rivaroxaban   Pernicious anemia    Prostate cancer ([place])    Pulmonary nodule, right    a. 10/2014 Cardiac CTA: 62m RLL nodule; b. 04/2015 CT Chest: stable 786mRLL nodule. No new nodules; 02/2017 CTA Chest: stable, benign, 79m64mLL pulm nodule.   Sleep difficulties    a.) takes melatonin + trazodone PRN   SVT (supraventricular tachycardia)    Tubular adenoma of colon    Past Surgical History:  Procedure Laterality Date   BICEPT TENODESIS Right 09/27/2021   Procedure: Right reverse shoulder arthroplasty, biceps tenodesis;  Surgeon: PatLeim FabryD;   Location: [facility] ORS;  Service: Orthopedics;  Laterality: Right;   CARDIOVERSION N/A 05/15/2019   Procedure: CARDIOVERSION;  Surgeon: GolMinna MerrittsD;  Location: [facility] ORS;  Service: Cardiovascular;  Laterality: N/A;   CARDIOVERSION N/A 03/13/2014   Procedure: CARDIOVERSION; [facility] Tim[facility]da [facility]   CARPAL TUNNEL RELEASE Right 12/23/2014   Procedure: CARPAL TUNNEL RELEASE;  Surgeon: HowEarnestine LeysD;  Location: [facility] ORS;  Service: Orthopedics;  Laterality: Right;   CARPAL TUNNEL RELEASE Left 01/06/2015   Procedure: CARPAL TUNNEL RELEASE;  Surgeon: HowEarnestine LeysD;  Location: [facility] ORS;  Service: Orthopedics;  Laterality: Left;   COLONOSCOPY WITH PROPOFOL N/A 10/08/2017   Procedure: COLONOSCOPY WITH PROPOFOL;  Surgeon: VanLin LandsmanD;  [facility];  Service: Gastroenterology;  Laterality: N/A;   GASTRIC BYPASS  01/17/2000   KNEE SURGERY Right    knee trauma x3   prostate seeding     REVERSE SHOULDER ARTHROPLASTY Right 09/27/2021   Procedure: Right reverse shoulder arthroplasty, biceps tenodesis;  Surgeon: PatLeim FabryD;  Location: [facility] ORS;  Service: Orthopedics;  Laterality: Right;   Family History  Problem Relation Age of Onset   Heart disease Father  Hypertension Father    Stroke Father    Cancer Father    Arrhythmia Sister        A-fib   Arrhythmia Brother        A-fib   Social History   Tobacco Use   Smoking status: Former    Packs/day: 1.00    Years: 10.00    Total pack years: 10.00    Types: Cigarettes    Quit date: 02/18/1979    Years since quitting: 42.7   Smokeless tobacco: Former    Types: Snuff  Vaping Use   Vaping Use: Never used  Substance Use Topics   Alcohol use: Not Currently    Comment: last drink in 2021   Drug use: No    Pertinent Clinical Results:  LABS: Labs reviewed: Acceptable for surgery.  Lab Results  Component Value Date   WBC 16.4 (H) 09/28/2021   HGB 10.0 (L) 09/28/2021   HCT 32.8 (L)  09/28/2021   MCV 78.3 (L) 09/28/2021   PLT 268 09/28/2021   Lab Results  Component Value Date   NA 141 09/28/2021   K 4.3 09/28/2021   CO2 27 09/28/2021   GLUCOSE 104 (H) 09/28/2021   BUN 16 09/28/2021   CREATININE 0.96 09/28/2021   CALCIUM 8.1 (L) 09/28/2021   GFRNONAA >60 09/28/2021    ECG: Date: 11/01/2021 Time ECG obtained: 1150 AM Rate: 53 bpm Rhythm: sinus bradycardia Axis (leads I and aVF): Normal Intervals: PR 158 ms. QRS 94 ms. QTc 397 ms. ST segment and T wave changes: No evidence of acute ST segment elevation or depression Comparison: Similar to previous tracing obtained on 03/14/2021   IMAGING / PROCEDURES: DIAGNOSTIC RADIOGRAPHS OF SHOULDER COMPLETE RIGHT MINIMUM 2 VIEWS performed on 10/27/2021 There is been interval anterior dislocation of the humeral component in relation to the glenoid.   No new fractures.  CT SHOULDER RIGHT WO CONTRAST performed on 09/08/2021 Large full-thickness retracted tear of the supraspinatus tendon with narrowing of the humeroacromial space. Mild glenohumeral joint degenerative changes and moderate AC joint degenerative changes. Moderate joint effusion and expected fluid in the subacromial/subdeltoid bursa.  MR SHOULDER LEFT WO CONTRAST performed on 08/31/2021 Severe rotator cuff tendinosis. Low-grade insertional tearing of the supraspinatus-infraspinatus tendon interdigitation. No full-thickness or retracted rotator cuff tear. Intra-articular biceps tendinosis with moderate tenosynovitis. Moderate-severe acromioclavicular and mild glenohumeral osteoarthritis.  MR SHOULDER RIGHT WO CONTRAST performed on 08/31/2021 Complete full-thickness retracted tears of the supraspinatus and subscapularis tendons with associated muscle atrophy. Severe infraspinatus tendinosis. Nonvisualization of the long head of the biceps tendon, likely torn and retracted. Severe acromioclavicular and mild glenohumeral osteoarthritis.  MR LUMBAR SPINE W WO  CONTRAST performed on 05/20/2021 Diffuse lumbar spine spondylosis as described above. No acute osseous injury of the lumbar spine. No aggressive osseous lesions to suggest metastatic disease  TRANSTHORACIC ECHOCARDIOGRAM performed on 07/04/2019 Normal left ventricular systolic function with an EF of 55% Mild hypokinesis of the mid to apical anteroseptal wall Diastolic Doppler parameters consistent with pseudonormalization (G2DD). Moderate LAE Normal right ventricular systolic function Moderately elevated RVSP of 45.3 mmHg Mild MAC Mild aortic sclerosis No valvular regurgitation Normal transvalvular gradients; no valvular stenosis No pericardial effusion  CT CARDIAC SCORING performed on 11/06/2014 Coronary calcium score of 224 (all in the proximal LAD).   This was the 74th percentile for age and sex matched control  Impression and Plan:  [name] has been referred for pre-anesthesia review and clearance prior to him undergoing the planned anesthetic and procedural  courses. Available labs, pertinent testing, and imaging results were personally reviewed by me. This patient has been appropriately cleared by cardiology with an overall LOW/ACCEPTABLE risk of significant perioperative cardiovascular complications.  Based on clinical review performed today (11/03/21), barring any significant acute changes in the patient's overall condition, it is anticipated that he will be able to proceed with the planned surgical intervention. Any acute changes in clinical condition may necessitate his procedure being postponed and/or cancelled. Patient will meet with anesthesia team (MD and/or CRNA) on the day of his procedure for preoperative evaluation/assessment. Questions regarding anesthetic course will be fielded at that time.   Pre-surgical instructions were reviewed with the patient during his PAT appointment and questions were fielded by PAT clinical staff. Patient was advised that if any questions  or concerns arise prior to his procedure then he should return a call to PAT and/or his surgeon's office to discuss.  Honor Loh, MSN, APRN, FNP-C, CEN [facility]  Peri-operative Services Nurse Practitioner Phone: [phone] Fax: [phone] 11/03/21 2:53 PM  NOTE: This note has been prepared using Dragon dictation software. Despite my best ability to proofread, there is always the potential that unintentional transcriptional errors may still occur from this process.

## 2021-11-08 ENCOUNTER — Ambulatory Visit
Admission: RE | Admit: 2021-11-08 | Discharge: 2021-11-08 | Disposition: A | Discharge status: Home / Self Care | Payer: Medicare Other | Attending: Orthopedic Surgery | Admitting: Orthopedic Surgery

## 2021-11-08 ENCOUNTER — Encounter: Payer: Self-pay | Admitting: Orthopedic Surgery

## 2021-11-08 ENCOUNTER — Encounter
Admission: RE | Disposition: A | Discharge status: Home / Self Care | Payer: Self-pay | Source: Home / Self Care | Attending: Orthopedic Surgery

## 2021-11-08 ENCOUNTER — Other Ambulatory Visit: Payer: Self-pay

## 2021-11-08 DIAGNOSIS — G8929 Other chronic pain: Secondary | ICD-10-CM | POA: Diagnosis not present

## 2021-11-08 DIAGNOSIS — M6281 Muscle weakness (generalized): Secondary | ICD-10-CM | POA: Diagnosis not present

## 2021-11-08 DIAGNOSIS — M25511 Pain in right shoulder: Secondary | ICD-10-CM | POA: Diagnosis not present

## 2021-11-08 SURGERY — ARTHROPLASTY, SHOULDER, TOTAL, REVERSE
Anesthesia: Choice | Site: Shoulder | Laterality: Right

## 2021-11-08 MED ORDER — FENTANYL CITRATE (PF) 100 MCG/2ML IJ SOLN
INTRAMUSCULAR | Status: AC
  Filled 2021-11-08: qty 2

## 2021-11-08 MED ORDER — LACTATED RINGERS IV SOLN: INTRAVENOUS | Status: DC

## 2021-11-08 MED ORDER — LIDOCAINE HCL (PF) 2 % IJ SOLN
INTRAMUSCULAR | Status: AC
  Filled 2021-11-08: qty 5

## 2021-11-08 MED ORDER — CEFAZOLIN IN SODIUM CHLORIDE 3-0.9 GM/100ML-% IV SOLN
3.0000 g | INTRAVENOUS | Status: DC
  Filled 2021-11-08: qty 100

## 2021-11-08 MED ORDER — PROPOFOL 10 MG/ML IV BOLUS
INTRAVENOUS | Status: AC
  Filled 2021-11-08: qty 20

## 2021-11-08 MED ORDER — CHLORHEXIDINE GLUCONATE 0.12 % MT SOLN: 15.0000 mL | Freq: Once | OROMUCOSAL | Status: DC

## 2021-11-08 MED ORDER — ONDANSETRON HCL 4 MG/2ML IJ SOLN
INTRAMUSCULAR | Status: AC
  Filled 2021-11-08: qty 2

## 2021-11-08 MED ORDER — ORAL CARE MOUTH RINSE: 15.0000 mL | Freq: Once | OROMUCOSAL | Status: DC

## 2021-11-08 MED ORDER — MIDAZOLAM HCL 2 MG/2ML IJ SOLN
INTRAMUSCULAR | Status: AC
  Filled 2021-11-08: qty 2

## 2021-11-08 MED ORDER — ROCURONIUM BROMIDE 10 MG/ML (PF) SYRINGE
PREFILLED_SYRINGE | INTRAVENOUS | Status: AC
  Filled 2021-11-08: qty 10

## 2021-11-08 MED ORDER — FAMOTIDINE 20 MG PO TABS: 20.0000 mg | ORAL_TABLET | Freq: Once | ORAL | Status: DC

## 2021-11-08 MED ORDER — SUCCINYLCHOLINE CHLORIDE 200 MG/10ML IV SOSY
PREFILLED_SYRINGE | INTRAVENOUS | Status: AC
  Filled 2021-11-08: qty 10

## 2021-11-08 SURGICAL SUPPLY — 63 items
BLADE SAGITTAL WIDE XTHICK NO (BLADE) ×1 IMPLANT
CHLORAPREP W/TINT 26 (MISCELLANEOUS) ×1 IMPLANT
CNTNR SPEC 2.5X3XGRAD LEK (MISCELLANEOUS) ×1
CONT SPEC 4OZ STER OR WHT (MISCELLANEOUS) ×1
CONTAINER SPEC 2.5X3XGRAD LEK (MISCELLANEOUS) ×1 IMPLANT
COOLER POLAR GLACIER W/PUMP (MISCELLANEOUS) ×1 IMPLANT
COVER BACK TABLE REUSABLE LG (DRAPES) ×1 IMPLANT
DERMABOND ADVANCED .7 DNX12 (GAUZE/BANDAGES/DRESSINGS) ×1 IMPLANT
DRAPE 3/4 80X56 (DRAPES) ×2 IMPLANT
DRAPE INCISE IOBAN 66X45 STRL (DRAPES) ×2 IMPLANT
DRAPE U-SHAPE 47X51 STRL (DRAPES) ×1 IMPLANT
DRSG OPSITE POSTOP 3X4 (GAUZE/BANDAGES/DRESSINGS) ×1 IMPLANT
DRSG OPSITE POSTOP 4X6 (GAUZE/BANDAGES/DRESSINGS) ×1 IMPLANT
DRSG OPSITE POSTOP 4X8 (GAUZE/BANDAGES/DRESSINGS) ×1 IMPLANT
DRSG TEGADERM 4X10 (GAUZE/BANDAGES/DRESSINGS) ×3 IMPLANT
ELECT REM PT RETURN 9FT ADLT (ELECTROSURGICAL) ×1
ELECTRODE REM PT RTRN 9FT ADLT (ELECTROSURGICAL) ×1 IMPLANT
GAUZE XEROFORM 1X8 LF (GAUZE/BANDAGES/DRESSINGS) ×1 IMPLANT
GLOVE BIOGEL PI IND STRL 8 (GLOVE) ×2 IMPLANT
GLOVE PI ULTRA LF STRL 7.5 (GLOVE) ×2 IMPLANT
GLOVE SURG ORTHO 8.0 STRL STRW (GLOVE) ×2 IMPLANT
GLOVE SURG SYN 8.0 (GLOVE) ×1 IMPLANT
GOWN STRL REUS W/ TWL LRG LVL3 (GOWN DISPOSABLE) ×2 IMPLANT
GOWN STRL REUS W/ TWL XL LVL3 (GOWN DISPOSABLE) ×1 IMPLANT
GOWN STRL REUS W/TWL LRG LVL3 (GOWN DISPOSABLE) ×2
GOWN STRL REUS W/TWL XL LVL3 (GOWN DISPOSABLE) ×1
HEMOVAC 400CC 10FR (MISCELLANEOUS) ×1 IMPLANT
HOOD PEEL AWAY FLYTE STAYCOOL (MISCELLANEOUS) ×3 IMPLANT
KIT STABILIZATION SHOULDER (MISCELLANEOUS) ×1 IMPLANT
MANIFOLD NEPTUNE II (INSTRUMENTS) ×1 IMPLANT
MASK FACE SPIDER DISP (MASK) ×1 IMPLANT
MAT ABSORB  FLUID 56X50 GRAY (MISCELLANEOUS) ×2
MAT ABSORB FLUID 56X50 GRAY (MISCELLANEOUS) ×2 IMPLANT
NEEDLE REVERSE CUT 1/2 CRC (NEEDLE) ×1 IMPLANT
NEEDLE SPNL 20GX3.5 QUINCKE YW (NEEDLE) ×1 IMPLANT
NS IRRIG 1000ML POUR BTL (IV SOLUTION) ×1 IMPLANT
PACK ARTHROSCOPY SHOULDER (MISCELLANEOUS) ×1 IMPLANT
PAD WRAPON POLAR SHDR XLG (MISCELLANEOUS) ×1 IMPLANT
PULSAVAC PLUS IRRIG FAN TIP (DISPOSABLE) ×1
SLING ULTRA II LG (MISCELLANEOUS) ×1 IMPLANT
SLING ULTRA II M (MISCELLANEOUS) ×1 IMPLANT
SPONGE T-LAP 18X18 ~~LOC~~+RFID (SPONGE) ×5 IMPLANT
STRAP SAFETY 5IN WIDE (MISCELLANEOUS) ×2 IMPLANT
SUT ETHIBOND 5-0 MS/4 CCS GRN (SUTURE)
SUT FIBERWIRE #2 38 BLUE 1/2 (SUTURE) ×4
SUT MNCRL AB 4-0 PS2 18 (SUTURE) IMPLANT
SUT PROLENE 6 0 P 1 18 (SUTURE) ×1 IMPLANT
SUT TICRON 2-0 30IN 311381 (SUTURE) ×3 IMPLANT
SUT VIC AB 0 CT1 36 (SUTURE) ×1 IMPLANT
SUT VIC AB 2-0 CT2 27 (SUTURE) ×2 IMPLANT
SUT XBRAID 1.4 BLK/WHT (SUTURE) IMPLANT
SUT XBRAID 1.4 BLUE (SUTURE) IMPLANT
SUT XBRAID 1.4 WHITE/BLUE (SUTURE) IMPLANT
SUT XBRAID 2 BLACK/BLUE (SUTURE) IMPLANT
SUTURE ETHBND 5-0 MS/4 CCS GRN (SUTURE) IMPLANT
SUTURE FIBERWR #2 38 BLUE 1/2 (SUTURE) ×4 IMPLANT
SYR 20ML LL LF (SYRINGE) ×1 IMPLANT
SYR 30ML LL (SYRINGE) ×2 IMPLANT
TIP FAN IRRIG PULSAVAC PLUS (DISPOSABLE) ×1 IMPLANT
TRAP FLUID SMOKE EVACUATOR (MISCELLANEOUS) ×1 IMPLANT
TRAY FOLEY SLVR 16FR LF STAT (SET/KITS/TRAYS/PACK) IMPLANT
WATER STERILE IRR 500ML POUR (IV SOLUTION) ×1 IMPLANT
WRAPON POLAR PAD SHDR XLG (MISCELLANEOUS) ×1

## 2021-11-09 ENCOUNTER — Telehealth: Payer: Medicare Other | Admitting: Student in an Organized Health Care Education/Training Program

## 2021-11-09 NOTE — H&P (Signed)
Surgery cancelled. Patient states shoulder much improved and felt that this "muscles awakened" after last reduction. He is performing almost all daily activities that he wants with minimal pains. Prior positions that caused dislocations no longer cause instability.   I was unable to dislocate patient's shoulder today in the preop area. He has close to full range of motion with minimal to no pain. Patient does not wish to pursue revision surgery this time. He does understand that there is still a risk that he dislocated again in the future. He will plan to follow up with me as an outpatient.

## 2021-11-13 ENCOUNTER — Other Ambulatory Visit: Payer: Self-pay | Admitting: Cardiovascular Disease

## 2021-11-15 ENCOUNTER — Encounter: Payer: Self-pay | Admitting: Student in an Organized Health Care Education/Training Program

## 2021-11-15 ENCOUNTER — Encounter
Payer: Medicare Other | Attending: Student in an Organized Health Care Education/Training Program | Admitting: Student in an Organized Health Care Education/Training Program

## 2021-11-15 VITALS — BP 137/83 | HR 58 | Temp 97.5°F | Resp 16 | Ht 72.0 in | Wt 325.0 lb

## 2021-11-15 DIAGNOSIS — M25561 Pain in right knee: Secondary | ICD-10-CM | POA: Diagnosis not present

## 2021-11-15 DIAGNOSIS — M47816 Spondylosis without myelopathy or radiculopathy, lumbar region: Secondary | ICD-10-CM

## 2021-11-15 DIAGNOSIS — G894 Chronic pain syndrome: Secondary | ICD-10-CM | POA: Diagnosis not present

## 2021-11-15 DIAGNOSIS — M19011 Primary osteoarthritis, right shoulder: Secondary | ICD-10-CM | POA: Diagnosis not present

## 2021-11-15 DIAGNOSIS — M25562 Pain in left knee: Secondary | ICD-10-CM

## 2021-11-15 DIAGNOSIS — G8929 Other chronic pain: Secondary | ICD-10-CM

## 2021-11-15 DIAGNOSIS — M19012 Primary osteoarthritis, left shoulder: Secondary | ICD-10-CM

## 2021-11-15 DIAGNOSIS — M6281 Muscle weakness (generalized): Secondary | ICD-10-CM | POA: Diagnosis not present

## 2021-11-15 DIAGNOSIS — M25511 Pain in right shoulder: Secondary | ICD-10-CM | POA: Diagnosis not present

## 2021-11-15 MED ORDER — HYDROCODONE-ACETAMINOPHEN 7.5-325 MG PO TABS: 1.0000 | ORAL_TABLET | Freq: Four times a day (QID) | ORAL | 0 refills | Status: AC | PRN

## 2021-11-15 MED ORDER — HYDROCODONE-ACETAMINOPHEN 7.5-325 MG PO TABS: 1.0000 | ORAL_TABLET | Freq: Four times a day (QID) | ORAL | 0 refills | Status: DC | PRN

## 2021-11-15 NOTE — Progress Notes (Addendum)
PROVIDER NOTE: Information contained herein reflects review and annotations entered in association with encounter. Interpretation of such information and data should be left to medically-trained personnel. Information provided to patient can be located elsewhere in the medical record under "Patient Instructions". Document created using STT-dictation technology, any transcriptional errors that may result from process are unintentional.    Patient: [name]  Service Category: E/M  Provider: Gillis Santa, MD  DOB: 1955-04-28  DOS: 11/15/2021  Specialty: Interventional Pain Management  MRN: [id number]  Setting: Ambulatory outpatient  PCP: Olin Hauser, DO  Type: Established Patient    Referring Provider: Nobie Putnam *  Location: Office  Delivery: Face-to-face     HPI  [name], a 66 y.o. year old male, is here today because of his Lumbar facet arthropathy [M47.816]. [name] primary complain today is Back Pain (lower) and Shoulder Pain (Will have left shoulder surgery in Dec 2023) Last encounter: My last encounter with him was on 10/12/21 Pertinent problems: [name] has H/O gastric bypass; DJD (degenerative joint disease) of knee; Bilateral carpal tunnel syndrome; Paroxysmal atrial fibrillation ([place]); Chronic radicular lumbar pain; Lumbar radiculopathy; Lumbar degenerative disc disease; Lumbar facet arthropathy; Lumbar facet joint syndrome; Primary osteoarthritis of both wrists; Trigger middle finger of left hand; and Chronic pain syndrome on their pertinent problem list. Pain Assessment: Severity of Chronic pain is reported as a 2  (after med)/10. Location: Back Lower/buttocks bilateral down back of left leg to feet effects toes. Onset: More than a month ago. Quality: Aching, Sharp, Stabbing, Spasm, Throbbing. Timing: Constant. Modifying factor(s): meds. Vitals:  height is 6' (1.829 m) and weight is 325 lb (147.4 kg) (abnormal). His temperature is 97.5 F (36.4 C)  (abnormal). His blood pressure is 137/83 and his pulse is 58 (abnormal). His respiration is 16 and oxygen saturation is 97%.   Reason for encounter: medication management and discussing repeating lumbar RFA.  Improvement in radiating left leg pain after left L3, L4 lumbar ESI performed 10/12/2021.  Endorsing approximately 60 to 70% pain relief in his left leg after the epidural steroid injection.  Unfortunately, he is experiencing increased lower back pain secondary to lumbar facet arthropathy and lumbar spondylosis, left L3, L4, L5 RFA done 02/16/2021 followed by right L3, L4, L5 RFA on 03/09/2021.  This provided the patient with 75 to 80% pain relief in regards to his axial low back pain for approximately 9 and half months.  We discussed repeating lumbar radiofrequency ablation for the purpose of obtaining longer-term pain relief regards to his low back pain.  Risk and benefits reviewed and patient would like to start with the left side first as he did previously.  He has been seeing Dr. Posey Pronto given instability of his right shoulder after his right reverse total shoulder arthroplasty on 09/27/2021.  He states that his shoulder has been dislocating.  He states that he was supposed to have revision surgery but they were able to reduce the shoulder and placed it in his shoulder immobilizer without any further dislocation.  As a result of this, surgery was avoided.    He has lost over 50 pounds in the last 8 months.  I commended him on this.  I encouraged him to continue with dieting and weight loss strategies.  Pharmacotherapy Assessment  Analgesic:  hydrocodone 7.5 mg 3 times daily as needed     Monitoring: [place] PMP: PDMP reviewed during this encounter.       Pharmacotherapy: No side-effects or adverse reactions  reported. Compliance: No problems identified. Effectiveness: Clinically acceptable.  Ignatius Specking, RN  11/15/2021 11:15 AM  Sign when Signing Visit Nursing Pain Medication Assessment:   Safety precautions to be maintained throughout the outpatient stay will include: orient to surroundings, keep bed in low position, maintain call bell within reach at all times, provide assistance with transfer out of bed and ambulation.  Medication Inspection Compliance: Pill count conducted under aseptic conditions, in front of the patient. Neither the pills nor the bottle was removed from the patient's sight at any time. Once count was completed pills were immediately returned to the patient in their original bottle.  Medication: Hydrocodone/APAP Pill/Patch Count:  13 of 120 pills remain Pill/Patch Appearance: Markings consistent with prescribed medication Bottle Appearance: Standard pharmacy container. Clearly labeled. Filled Date: 10 / 07 / 2023 Last Medication intake:  Today     UDS:  Summary  Date Value Ref Range Status  08/23/2021 Note  Final    Comment:    ==================================================================== [facility] (MW) ==================================================================== Test                             Result       Flag       Units  Drug Present and Declared for Prescription Verification   Hydrocodone                    4372         EXPECTED   ng/mg creat   Hydromorphone                  98           EXPECTED   ng/mg creat   Dihydrocodeine                 478          EXPECTED   ng/mg creat   Norhydrocodone                 >2488        EXPECTED   ng/mg creat    Sources of hydrocodone include scheduled prescription medications.    Hydromorphone, dihydrocodeine and norhydrocodone are expected    metabolites of hydrocodone. Hydromorphone and dihydrocodeine are    also available as scheduled prescription medications.  ==================================================================== Test                      Result    Flag   Units      Ref Range   Creatinine              201              mg/dL       >=20 ==================================================================== Declared Medications:  The flagging and interpretation on this report are based on the  following declared medications.  Unexpected results may arise from  inaccuracies in the declared medications.   **Note: The testing scope of this panel includes these medications:   Hydrocodone (Norco)   **Note: The testing scope of this panel does not include the  following reported medications:   Acetaminophen (Norco)  Amiodarone (Pacerone)  Amlodipine (Norvasc)  Aspirin  Benazepril (Lotensin)  Buspirone (Buspar)  Cyclobenzaprine (Flexeril)  Duloxetine (Cymbalta)  Eye Drops  Furosemide (Lasix)  Melatonin  Metoprolol (Toprol)  Multivitamin  Rivaroxaban (Xarelto)  Supplement  Tamsulosin (Flomax)  Trazodone (Desyrel)  Vitamin B12 ==================================================================== For clinical  consultation, please call [phone]. ====================================================================      ROS  Constitutional: Denies any fever or chills Gastrointestinal: No reported hemesis, hematochezia, vomiting, or acute GI distress Musculoskeletal:  Low back pain, worse with facet loading Neurological: No reported episodes of acute onset apraxia, aphasia, dysarthria, agnosia, amnesia, paralysis, loss of coordination, or loss of consciousness  Medication Review  DULoxetine, Evolocumab, HYDROcodone-acetaminophen, amLODipine, amiodarone, benazepril, busPIRone, carboxymethylcellulose, cyanocobalamin, cyclobenzaprine, ibuprofen, melatonin, metoprolol succinate, multivitamin with minerals, ondansetron, oxyCODONE, rivaroxaban, and traZODone  History Review  Allergy: [name] has No Known Allergies. Drug: [name]  reports no history of drug use. Alcohol:  reports that he does not currently use alcohol. Tobacco:  reports that he quit smoking about 42 years ago. His smoking use included  cigarettes. He has a 10.00 pack-year smoking history. He has quit using smokeless tobacco.  His smokeless tobacco use included snuff. Social: [name]  reports that he quit smoking about 42 years ago. His smoking use included cigarettes. He has a 10.00 pack-year smoking history. He has quit using smokeless tobacco.  His smokeless tobacco use included snuff. He reports that he does not currently use alcohol. He reports that he does not use drugs. Medical:  has a past medical history of Aortic atherosclerosis ([place]), Cardiomyopathy (in setting of Afib), Chronic pain syndrome, Chronic, continuous use of opioids, Coronary artery disease (11/06/2014), Diverticulosis, DJD (degenerative joint disease) of knee, History of hiatal hernia, History of kidney stones (2012), Hyperlipidemia, Hyperplastic colon polyp, Hypertension, Internal hemorrhoids, Intervertebral disc disorder with radiculopathy of lumbosacral region, Long term current use FULL DOSE (325 mg) aspirin, Long term current use of amiodarone, Long term current use of antithrombotics/antiplatelets, Lower extremity edema, Mixed hyperlipidemia, Myalgia due to statin, Osteoarthritis, PAF (paroxysmal atrial fibrillation) ([place]), Pernicious anemia, Prostate cancer ([place]), Pulmonary nodule, right, Sleep difficulties, SVT (supraventricular tachycardia), and Tubular adenoma of colon. Surgical: [name]  has a past surgical history that includes Knee surgery (Right); Gastric bypass (01/17/2000); prostate seeding; Carpal tunnel release (Right, 12/23/2014); Carpal tunnel release (Left, 01/06/2015); Colonoscopy with propofol (N/A, 10/08/2017); Cardioversion (N/A, 05/15/2019); Cardioversion (N/A, 03/13/2014); Reverse shoulder arthroplasty (Right, 09/27/2021); and Bicept tenodesis (Right, 09/27/2021). Family: family history includes Arrhythmia in his brother and sister; Cancer in his father; Heart disease in his father; Hypertension in his father; Stroke in his  father.  Laboratory Chemistry Profile   Renal Lab Results  Component Value Date   BUN 16 09/28/2021   CREATININE 0.96 83/66/2947   BCR NOT APPLICABLE 65/46/5035   GFRAA 72 07/21/2020   GFRNONAA >60 09/28/2021    Hepatic Lab Results  Component Value Date   AST 12 07/27/2021   ALT 10 07/27/2021   ALBUMIN 4.6 08/01/2019   ALKPHOS 72 08/01/2019   LIPASE 28 03/13/2017    Electrolytes Lab Results  Component Value Date   NA 141 09/28/2021   K 4.3 09/28/2021   CL 111 09/28/2021   CALCIUM 8.1 (L) 09/28/2021   MG 2.0 02/01/2021    Bone Lab Results  Component Value Date   25OHVITD1 32 02/01/2021   25OHVITD2 <1.0 02/01/2021   25OHVITD3 32 02/01/2021    Inflammation (CRP: Acute Phase) (ESR: Chronic Phase) No results found for: "CRP", "ESRSEDRATE", "LATICACIDVEN"       Note: Above Lab results reviewed.    Physical Exam  General appearance: Well nourished, well developed, and well hydrated. In no apparent acute distress Mental status: Alert, oriented x 3 (person, place, & time)       Respiratory: No evidence  of acute respiratory distress Eyes: PERLA Vitals: BP 137/83   Pulse (!) 58   Temp (!) 97.5 F (36.4 C)   Resp 16   Ht 6' (1.829 m)   Wt (!) 325 lb (147.4 kg)   SpO2 97%   BMI 44.08 kg/m  BMI: Estimated body mass index is 44.08 kg/m as calculated from the following:   Height as of this encounter: 6' (1.829 m).   Weight as of this encounter: 325 lb (147.4 kg). Ideal: Ideal body weight: 77.6 kg (171 lb 1.2 oz) Adjusted ideal body weight: 105.5 kg (232 lb 10.3 oz)  Lumbar Spine Area Exam  Skin & Axial Inspection: No masses, redness, or swelling Alignment: Symmetrical Functional ROM: Pain restricted ROM       Stability: No instability detected Muscle Tone/Strength: Functionally intact. No obvious neuro-muscular anomalies detected. Sensory (Neurological): Musculoskeletal pain pattern Palpation: No palpable anomalies       Provocative  Tests: Hyperextension/rotation test: (+) bilaterally for facet joint pain. Lumbar quadrant test (Kemp's test): (+) bilaterally for facet joint pain.  Gait & Posture Assessment  Ambulation: Unassisted Gait: Relatively normal for age and body habitus Posture: WNL  Lower Extremity Exam      Side: Right lower extremity   Side: Left lower extremity  Stability: No instability observed           Stability: No instability observed          Skin & Extremity Inspection: Skin color, temperature, and hair growth are WNL. No peripheral edema or cyanosis. No masses, redness, swelling, asymmetry, or associated skin lesions. No contractures.   Skin & Extremity Inspection: Skin color, temperature, and hair growth are WNL. No peripheral edema or cyanosis. No masses, redness, swelling, asymmetry, or associated skin lesions. No contractures.  Functional ROM: Pain restricted ROM for hip and knee joints           Functional ROM: Pain restricted ROM for hip and knee joints          Muscle Tone/Strength: Functionally intact. No obvious neuro-muscular anomalies detected.   Muscle Tone/Strength: Functionally intact. No obvious neuro-muscular anomalies detected.  Sensory (Neurological): Unimpaired         Sensory (Neurological): Unimpaired        DTR: Patellar: deferred today Achilles: deferred today Plantar: deferred today   DTR: Patellar: deferred today Achilles: deferred today Plantar: deferred today  Palpation: No palpable anomalies   Palpation: No palpable anomalies     Assessment   Status Diagnosis  Having a Flare-up Having a Flare-up Having a Flare-up 1. Lumbar facet arthropathy   2. Lumbar facet joint syndrome   3. Chronic pain of both knees   4. Chronic pain syndrome   5. Localized osteoarthritis of shoulder regions, bilateral       Plan of Care   Mr. BARAN Hill has a current medication list which includes the following long-term medication(s): amiodarone, benazepril, duloxetine, metoprolol  succinate, xarelto, trazodone, and amlodipine.  Pharmacotherapy (Medications Ordered): Meds ordered this encounter  Medications   HYDROcodone-acetaminophen (NORCO) 7.5-325 MG tablet    Sig: Take 1 tablet by mouth every 6 (six) hours as needed for severe pain. Must last 30 days    Dispense:  120 tablet    Refill:  0    Chronic Pain: STOP Act (Not applicable) Fill 1 day early if closed on refill date. Avoid benzodiazepines within 8 hours of opioids   HYDROcodone-acetaminophen (NORCO) 7.5-325 MG tablet    Sig: Take 1 tablet  by mouth every 6 (six) hours as needed for severe pain. Must last 30 days    Dispense:  120 tablet    Refill:  0    Chronic Pain: STOP Act (Not applicable) Fill 1 day early if closed on refill date. Avoid benzodiazepines within 8 hours of opioids   HYDROcodone-acetaminophen (NORCO) 7.5-325 MG tablet    Sig: Take 1 tablet by mouth every 6 (six) hours as needed for severe pain. Must last 30 days    Dispense:  120 tablet    Refill:  0    Chronic Pain: STOP Act (Not applicable) Fill 1 day early if closed on refill date. Avoid benzodiazepines within 8 hours of opioids   Orders:  Orders Placed This Encounter  Procedures   Radiofrequency,Lumbar    Standing Status:   Future    Standing Expiration Date:   02/15/2022    Scheduling Instructions:     Side(s): LEFT     Level(s): L3, L4, L5, Medial Branch Nerve(s)     Sedation: IV Versed     Scheduling Timeframe: As soon as pre-approved    Order Specific Question:   Where will this procedure be performed?    Answer:   [facility]   Radiofrequency,Lumbar    Standing Status:   Future    Standing Expiration Date:   02/15/2022    Scheduling Instructions:     Side(s): RIGHT     Level(s): L3, L4, L5, Medial Branch Nerve(s)     Sedation: IV Versed     Scheduling Timeframe: 2 weeks after left    Order Specific Question:   Where will this procedure be performed?    Answer:   [facility]   Follow-up plan:    Return in about 1 week (around 11/22/2021) for Left L3, 4, 5 RFA, in clinic IV Versed (stop Xarelto 3 days prior, ASA 325 7 days prior).     Recent Visits Date Type Provider Dept  10/12/21 Procedure visit Gillis Santa, MD [facility]  09/06/21 Office Visit Gillis Santa, MD [facility]  08/23/21 Office Visit Gillis Santa, MD [facility]  Showing recent visits within past 90 days and meeting all other requirements Today's Visits Date Type Provider Dept  11/15/21 Office Visit Gillis Santa, MD [facility]  Showing today's visits and meeting all other requirements Future Appointments Date Type Provider Dept  02/07/22 Appointment Gillis Santa, MD [facility]  Showing future appointments within next 90 days and meeting all other requirements  I discussed the assessment and treatment plan with the patient. The patient was provided an opportunity to ask questions and all were answered. The patient agreed with the plan and demonstrated an understanding of the instructions.  Patient advised to call back or seek an in-person evaluation if the symptoms or condition worsens.  Duration of encounter: 30 minutes.  Note by: Gillis Santa, MD Date: 11/15/2021; Time: 3:25 PM

## 2021-11-15 NOTE — Patient Instructions (Signed)
______________________________________________________________________  Preparing for your procedure  During your procedure appointment there will be: No Prescription Refills. No disability issues to discussed. No medication changes or discussions.  Instructions: Food intake: Avoid eating anything solid for at least 8 hours prior to your procedure. Clear liquid intake: You may take clear liquids such as water up to 2 hours prior to your procedure. (No carbonated drinks. No soda.) Transportation: Unless otherwise stated by your physician, bring a driver. Morning Medicines: Except for blood thinners, take all of your other morning medications with a sip of water. Make sure to take your heart and blood pressure medicines. If your blood pressure's lower number is above 100, the case will be rescheduled. Blood thinners: If you take a blood thinner, but were not instructed to stop it, call our office [phone] and ask to talk to a nurse. Not stopping a blood thinner prior to certain procedures could lead to serious complications. Diabetics on insulin: Notify the staff so that you can be scheduled 1st case in the morning. If your diabetes requires high dose insulin, take only  of your normal insulin dose the morning of the procedure and notify the staff that you have done so. Preventing infections: Shower with an antibacterial soap the morning of your procedure.  Build-up your immune system: Take 1000 mg of Vitamin C with every meal (3 times a day) the day prior to your procedure. Antibiotics: Inform the nursing staff if you are taking any antibiotics or if you have any conditions that may require antibiotics prior to procedures. (Example: recent joint implants)   Pregnancy: If you are pregnant make sure to notify the nursing staff. Not doing so may result in injury to the fetus, including death.  Sickness: If you have a cold, fever, or any active infections, call and cancel or reschedule your  procedure. Receiving steroids while having an infection may result in complications. Arrival: You must be in the facility at least 30 minutes prior to your scheduled procedure. Tardiness: Your scheduled time is also the cutoff time. If you do not arrive at least 15 minutes prior to your procedure, you will be rescheduled.  Children: Do not bring any children with you. Make arrangements to keep them home. Dress appropriately: There is always a possibility that your clothing may get soiled. Avoid long dresses. Valuables: Do not bring any jewelry or valuables.  Reasons to call and reschedule or cancel your procedure: (Following these recommendations will minimize the risk of a serious complication.) Surgeries: Avoid having procedures within 2 weeks of any surgery. (Avoid for 2 weeks before or after any surgery). Flu Shots: Avoid having procedures within 2 weeks of a flu shots or . (Avoid for 2 weeks before or after immunizations). Barium: Avoid having a procedure within 7-10 days after having had a radiological study involving the use of radiological contrast. (Myelograms, Barium swallow or enema study). Heart attacks: Avoid any elective procedures or surgeries for the initial 6 months after a "Myocardial Infarction" (Heart Attack). Blood thinners: It is imperative that you stop these medications before procedures. Let us know if you if you take any blood thinner.  Infection: Avoid procedures during or within two weeks of an infection (including chest colds or gastrointestinal problems). Symptoms associated with infections include: Localized redness, fever, chills, night sweats or profuse sweating, burning sensation when voiding, cough, congestion, stuffiness, runny nose, sore throat, diarrhea, nausea, vomiting, cold or Flu symptoms, recent or current infections. It is specially important if the infection is   over the area that we intend to treat. Heart and lung problems: Symptoms that may suggest an  active cardiopulmonary problem include: cough, chest pain, breathing difficulties or shortness of breath, dizziness, ankle swelling, uncontrolled high or unusually low blood pressure, and/or palpitations. If you are experiencing any of these symptoms, cancel your procedure and contact your primary care physician for an evaluation.  Remember:  Regular Business hours are:  Monday to Thursday 8:00 AM to 4:00 PM  Provider's Schedule: Milinda Pointer, MD:  Procedure days: Tuesday and Thursday 7:30 AM to 4:00 PM  Gillis Santa, MD:  Procedure days: Monday and Wednesday 7:30 AM to 4:00 PM  ______________________________________________________________________

## 2021-11-15 NOTE — Progress Notes (Signed)
Nursing Pain Medication Assessment:  Safety precautions to be maintained throughout the outpatient stay will include: orient to surroundings, keep bed in low position, maintain call bell within reach at all times, provide assistance with transfer out of bed and ambulation.  Medication Inspection Compliance: Pill count conducted under aseptic conditions, in front of the patient. Neither the pills nor the bottle was removed from the patient's sight at any time. Once count was completed pills were immediately returned to the patient in their original bottle.  Medication: Hydrocodone/APAP Pill/Patch Count:  13 of 120 pills remain Pill/Patch Appearance: Markings consistent with prescribed medication Bottle Appearance: Standard pharmacy container. Clearly labeled. Filled Date: 10 / 07 / 2023 Last Medication intake:  Today

## 2021-11-24 DIAGNOSIS — T84028A Dislocation of other internal joint prosthesis, initial encounter: Secondary | ICD-10-CM | POA: Diagnosis not present

## 2021-11-24 DIAGNOSIS — Z96611 Presence of right artificial shoulder joint: Secondary | ICD-10-CM | POA: Diagnosis not present

## 2021-11-25 ENCOUNTER — Ambulatory Visit: Payer: Medicare Other | Attending: Orthopedic Surgery

## 2021-11-25 DIAGNOSIS — M6281 Muscle weakness (generalized): Secondary | ICD-10-CM | POA: Diagnosis not present

## 2021-11-25 DIAGNOSIS — G8929 Other chronic pain: Secondary | ICD-10-CM | POA: Insufficient documentation

## 2021-11-25 DIAGNOSIS — M25511 Pain in right shoulder: Secondary | ICD-10-CM | POA: Insufficient documentation

## 2021-11-25 NOTE — Therapy (Signed)
OUTPATIENT PHYSICAL THERAPY SHOULDER TREATMENT   Patient Name: [name] MRN: [id number] DOB:[birth year], 66 y.o., male Today's Date: 11/25/2021   PT End of Session - 11/25/21 1239     Visit Number 7    Number of Visits 25    Date for PT Re-Evaluation 12/23/21    Authorization Type eval: 09/30/21    PT Start Time 1100    PT Stop Time 1145    PT Time Calculation (min) 45 min    Activity Tolerance Patient tolerated treatment well    Behavior During Therapy WFL for tasks assessed/performed             Past Medical History:  Diagnosis Date   Aortic atherosclerosis ([place])    Cardiomyopathy (in setting of Afib)    a.) TTE 12/26/2013: EF 45-50%, mild ant and antsept HK. mild MR. Mod dil LA. nl RV fxn. Rhythm was Afib; b.) TTE 07/04/2019: EF 55%, mid-apical anteroseptal HK, mild MAC, mild Ao sclerosis, G2DD, RVSP 45.3   Chronic pain syndrome    a.) followed by pain management   Chronic, continuous use of opioids    a.) hydrocodone/APAP 7.5/325 mg; followed by pain management   Coronary artery disease 11/06/2014   a.) cCTA 11/06/2014: Ca score 224 (all in pLAD) -- 74th percentile for age/sex matched control   Diverticulosis    DJD (degenerative joint disease) of knee    History of hiatal hernia    History of kidney stones 2012   Hyperlipidemia    Hyperplastic colon polyp    Hypertension    Internal hemorrhoids    Intervertebral disc disorder with radiculopathy of lumbosacral region    Long term current use FULL DOSE (325 mg) aspirin    Long term current use of amiodarone    Long term current use of antithrombotics/antiplatelets    a.) rivaroxaban   Lower extremity edema    Mixed hyperlipidemia    Myalgia due to statin    Osteoarthritis    PAF (paroxysmal atrial fibrillation) ([place])    a.) CHA2DS2VASc = 4 (age, HTN, CHF, vascular disease history);  b.) s/p DCCV 03/13/2014 (200 J x1); c.) s/p DCCV 05/15/2019 (150 J x 1, 200 J x2); d.) rate/rhythm maintained on oral  amiodarone + metoprolol succinate; chronically anticoagulated with rivaroxaban   Pernicious anemia    Prostate cancer ([place])    Pulmonary nodule, right    a. 10/2014 Cardiac CTA: 26m RLL nodule; b. 04/2015 CT Chest: stable 731mRLL nodule. No new nodules; 02/2017 CTA Chest: stable, benign, 75m45mLL pulm nodule.   Sleep difficulties    a.) takes melatonin + trazodone PRN   SVT (supraventricular tachycardia)    Tubular adenoma of colon    Past Surgical History:  Procedure Laterality Date   BICEPT TENODESIS Right 09/27/2021   Procedure: Right reverse shoulder arthroplasty, biceps tenodesis;  Surgeon: PatLeim FabryD;  Location: [facility];  Service: Orthopedics;  Laterality: Right;   CARDIOVERSION N/A 05/15/2019   Procedure: CARDIOVERSION;  Surgeon: GolMinna MerrittsD;  Location: [facility];  Service: Cardiovascular;  Laterality: N/A;   CARDIOVERSION N/A 03/13/2014   Procedure: CARDIOVERSION; [facility] TimIda RogueD   CARPAL TUNNEL RELEASE Right 12/23/2014   Procedure: CARPAL TUNNEL RELEASE;  Surgeon: HowEarnestine LeysD;  Location: [facility];  Service: Orthopedics;  Laterality: Right;   CARPAL TUNNEL RELEASE Left 01/06/2015   Procedure: CARPAL TUNNEL RELEASE;  Surgeon: HowEarnestine LeysD;  Location: [facility];  Service: Orthopedics;  Laterality: Left;  COLONOSCOPY WITH PROPOFOL N/A 10/08/2017   Procedure: COLONOSCOPY WITH PROPOFOL;  Surgeon: Lin Landsman, MD;  Location: [facility];  Service: Gastroenterology;  Laterality: N/A;   GASTRIC BYPASS  01/17/2000   KNEE SURGERY Right    knee trauma x3   prostate seeding     REVERSE SHOULDER ARTHROPLASTY Right 09/27/2021   Procedure: Right reverse shoulder arthroplasty, biceps tenodesis;  Surgeon: Leim Fabry, MD;  Location: [facility];  Service: Orthopedics;  Laterality: Right;   Patient Active Problem List   Diagnosis Date Noted   S/p reverse total shoulder arthroplasty 09/27/2021   Lesion of bone of lumbosacral spine (L5)  05/12/2021   Localized osteoarthritis of shoulder regions, bilateral 03/29/2021   Chronic pain of both shoulders 03/29/2021   Drug-induced myopathy 02/21/2021   Trigger finger of right hand 11/15/2020   Prostate cancer ([place]) 07/26/2020   Insomnia 08/01/2019   Primary osteoarthritis of both wrists 02/17/2019   Trigger middle finger of left hand 02/17/2019   Chronic pain syndrome 02/17/2019   Chronic radicular lumbar pain 10/08/2018   Lumbar radiculopathy 10/08/2018   Lumbar degenerative disc disease 10/08/2018   Lumbar facet arthropathy 10/08/2018   Lumbar facet joint syndrome 10/08/2018   Intervertebral disc disorder with radiculopathy of lumbosacral region    Pernicious anemia 05/22/2016   Paroxysmal atrial fibrillation ([place]) 12/20/2015   DJD (degenerative joint disease) of knee 10/27/2014   Bilateral carpal tunnel syndrome 10/27/2014   Morbid obesity with BMI of 50.0-59.9, adult ([place]) 10/27/2014   Mixed hyperlipidemia 10/27/2014   H/O gastric bypass 03/20/2014   Hyperkalemia 03/20/2014   History of prostate cancer 12/26/2013   Essential hypertension 12/26/2013   Encounter for anticoagulation discussion and counseling 12/26/2013   PCP: Olin Hauser, DO  REFERRING PROVIDER: Leim Fabry, MD  REFERRING DIAGNOSIS: M75.121 (ICD-10-CM) - Complete rotator cuff tear or rupture of right shoulder, not specified as traumatic  THERAPY DIAG: Chronic right shoulder pain  Muscle weakness (generalized)  RATIONALE FOR EVALUATION AND TREATMENT: Rehabilitation  ONSET DATE: 09/27/21  FOLLOW UP APPT WITH PROVIDER: Yes, 10/10/21 at 10:15 with Dr. Posey Pronto   FROM INITIAL EVALUATION (09/30/21) SUBJECTIVE:                                                                                                                                                                                         Chief Complaint: Right reverse TSR and right biceps tenodesis on 09/27/21  Pertinent History Pt has  a history of chronic R shoulder pain and failed conservative management. Imaging was consistent with massive, irreparable rotator cuff tear. He underwent right reverse TSR and right biceps tenodesis on 09/27/21. Operative findings  include massive rotator cuff tear (complete supraspinatus, partial infraspinatus, complete subscapularis). No reported post-op complications with the exception of trying to coordinate proper medication management. He was advised by MD that he is allowed to remove his sling to perform pendulums and to bathe. He has been able to perform pendulums since hospital discharge. Pt reports being consistent with ice since discharge. He has been sleeping in chairs as well as in his bed since discharge. He reports using proper support for his RUE. He has a history of chronic back pain which has been worse since admission to the hospital. Pt is wanted to be able to have his L shoulder surgery by the end of this year.   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 Passive RoM allowed to 90 FF and 30 ER. General reverse TSR precautions include no combined shoulder extension, IR, and adduction. No shoulder and elbow AROM (bicep tenodesis) for at least the first 6 weeks to  be further assessed once protocol can be obtained;  Weight Bearing Restrictions: Yes no WB through [place] with: lives with their spouse, granddaughter over frequently (62 years old); Lives in: House/apartment   Patient Goals: Improve R shoulder function, return to working in the yard;  OBJECTIVE:   Patient Surveys  FOTO: 20, predicted improvement to 93 QuickDASH: Deferred  Cognition Patient is oriented to person, place, and time.  Recent memory is intact.  Remote memory is intact.  Attention span and concentration are intact.  Expressive speech is intact.  Patient's fund of knowledge is within normal limits for educational level.    Gross Musculoskeletal Assessment Tremor: None Bulk: Normal Tone: Normal Incision is dry and clean without excessive warmth, redness, or swelling noted around R shoulder;  Gait Deferred  Posture Forward head and rounded shoulders;  Cervical Screen AROM: WFL and painless with overpressure in all planes Spurlings A (ipsilateral lateral flexion/axial compression): R: Not examined L: Not examined Spurlings B (ipsilateral lateral flexion/contralateral rotation/axial compression): R: Not examined L: Not examined Repeated movement: No centralization or peripheralization with protraction or retraction Hoffman Sign (cervical cord compression): R: Not examined L: Not examined ULTT Median: R: Not examined L: Not examined ULTT Ulnar: R: Not examined L: Not examined ULTT Radial: R: Not examined L: Not examined  AROM  AROM (Normal range in degrees) AROM 11/25/2021  Cervical  Flexion (50) WFL  Extension (80) WFL  Right lateral flexion (45) WFL  Left lateral flexion (45) WFL  Right rotation (85) WFL  Left rotation (85) WFL   Right Left  Shoulder    Flexion Approx 60 (PROM)*   Extension    Abduction Approx 70 (PROM)*   External Rotation 0 (PROM)*   Internal Rotation Palm to stomach at neutral shoulder   Hands Behind  Head    Hands Behind Back        Elbow  Flexion Full (PROM)   Extension 0 (PROM)   Pronation Full (PROM)   Supination Full (PROM)   (* = pain; Blank rows = not tested)  UE MMT:  MMT (out of 5) Right 10/01/2021 Left 10/01/2021  Cervical (isometric)  Flexion   Extension   Lateral Flexion    Rotation        Shoulder   Flexion    Extension    Abduction    External rotation    Internal rotation    Horizontal abduction    Horizontal adduction    Lower Trapezius    Rhomboids        Elbow  Flexion    Extension    Pronation    Supination        Wrist  Flexion 5 5  Extension 5 5  Radial deviation 5 5  Ulnar deviation 5 5      MCP  Flexion 5 5  Extension 5 5  Abduction 5 5  Adduction 5 5  (* = pain; Blank rows = not tested)  Grip: Strong and roughly symmetrical BUE  Sensation Grossly intact to light touch bilateral UE as determined by testing dermatomes C2-T2. Proprioception and hot/cold testing deferred on this date.  Reflexes Deferred  Palpation  Location LEFT  RIGHT           Subocciptials    Cervical paraspinals    Upper Trapezius    Levator Scapulae    Rhomboid Major/Minor    Sternoclavicular joint    Acromioclavicular joint    Coracoid process    Long head of biceps    Supraspinatus    Infraspinatus    Subscapularis    Teres Minor    Teres Major    Pectoralis Major    Pectoralis Minor    Anterior Deltoid  1  Lateral Deltoid  0  Posterior Deltoid  0  Latissimus Dorsi    Sternocleidomastoid    (Blank rows = not tested) Graded on 0-4 scale (0 = no pain, 1 = pain, 2 = pain with wincing/grimacing/flinching, 3 = pain with withdrawal, 4 = unwilling to allow palpation), (Blank rows = not tested)  Repeated Movements Deferred  Passive Accessory Intervertebral Motion Deferred  Accessory Motions/Glides Muscle Length Testing: Deferred  Special Tests: Deferred  Beighton scale: Deferred   TODAY'S TREATMENT    SUBJECTIVE: Pt reports  that he is doing well today. He denies any R shoulder pain at rest today. He was experiencing multiple shoulder dislocations/subluxations and contacted Dr. Posey Pronto. He went for an appointment and his shoulder was dislocated on xray. Pt was scheduled for a revision surgery. However prior to the revision pt reports that the dislocations stopped and he felt the muscles "wake up." He went into the hospital for pre-op and Dr. Posey Pronto was unable to dislocate his shoulder so surgery was cancelled. He saw Dr. Posey Pronto yesterday and reports a few instances where he felt his shoulder pop slightly, but no dislocation or subluxation. He has full R shoulder range of motion and reports that he was not provided any specific precautions from the surgeon moving forward.    PAIN: Denies resting R shoulder pain   Ther-ex  R shoulder AROM measures: flexion: 180 degrees abduction: >180 degrees ER: 90 degrees IR: 69 degrees  All exercises performed in semirecumbent position: R shoulder flexion from neutral to 90 degrees with 1# dumbbell 2 x 10; R shoulder serratus punch with 1# dumbbell 2 x 10; R shoulder rhythmic stabilization with  resistance at wrist 2 x 30s; R shoulder circles with 1# dumbbell CW/CCW 2 x 10 each direction; PVC chest press with manual resistance 2 x 10;  All exercises performed in L sidelying position: R shoulder ER with 1# dumbbell 2 x 10; R shoulder abduction from neutral to 90 with 1# dumbbell 2 x 10; R scapular retraction with manual resistance 2 x 10;  Seated PVC rows with manual resistance 2 x 10; Seated PVC tricep press down with manual resistance 2 x 10; Seated R elbow flexion with 2# dumbbell 2 x 10;   Not performed: Standing forward bending R shoulder pendulums flexion/extension, horiz abduction/adduction, and circles x multiple bouts each with verbal cues;   PATIENT EDUCATION:  Education details: Pt educated throughout session about proper posture and technique with exercises.  Improved exercise technique, movement at target joints, use of target muscles after min to mod verbal, visual, tactile cues.  Person educated: Patient Education method: Explanation, verbal cues, tactile cues Education comprehension: verbalized understanding and returned demonstration   HOME EXERCISE PROGRAM: Access Code: [email or web address] URL: [email or web address] Date: 11/25/2021 Prepared by: Roxana Hires  Exercises - Seated Cervical Sidebending Stretch  - 2 x daily - 7 x weekly - 3 reps - 30s hold - Seated Scapular Retraction  - 2 x daily - 7 x weekly - 2 sets - 10 reps - 3s hold - Seated Shoulder Row with Anchored Resistance  - 2 x daily - 7 x weekly - 2 sets - 10 reps - Seated Bicep Curls Supinated with Dumbbells  - 2 x daily - 7 x weekly - 2 sets - 10 reps   ASSESSMENT:  CLINICAL IMPRESSION: Pt demonstrates full AROM in R shoulder in both seated and supine positions. He reports some mild pain at end range flexion and therapist encouraged pt not to push his shoulder through resistance or pain. Initiated light strengthening below 90 degrees with patient during visit today. He is able to complete all exercises with appropriate muscle fatigue and no pain. HEP updated during visit and pt encouraged to follow-up as scheduled. He will benefit from skilled PT to address above impairments and improve overall function.  REHAB POTENTIAL: Good  CLINICAL DECISION MAKING: Unstable/unpredictable  EVALUATION COMPLEXITY: High   GOALS: Goals reviewed with patient? Yes  SHORT TERM GOALS: Target date: 12/23/2021  Pt will be independent with HEP to improve strength and decrease shoulder pain to improve pain-free function at home and when working in the yard Baseline:  Goal status: INITIAL   LONG TERM GOALS: Target date: 01/20/2022  Pt will increase FOTO to at least 54 to demonstrate significant improvement in function at home and work related to neck pain  Baseline: 09/30/21: 20 Goal  status: INITIAL  2.  Pt will decrease worst shoulder pain by at least 3 points on the NPRS in order to demonstrate clinically significant reduction in shoulder pain. Baseline: 09/30/21: worst 10/10 Goal status: INITIAL  3.  Pt will decrease quick DASH score by at least 8% in order to demonstrate clinically significant reduction in disability related to shoulder pain        Baseline: 09/30/21: To be completed Goal status: INITIAL  4. Pt will increase strength of R shoulder flexion and abduction to at least 4/5 in order to improve R shoulder strength and function so he can complete his household responsibilities and work in the yard      Baseline: 09/30/21: Unable to test; Goal status: INITIAL  5. Pt will increase R shoulder  AROM flexion and abduction to at least 120 each in to improve R shoulder function so he can complete his household responsibilities and work in the yard      Baseline: 09/30/21: Unable to test; 11/25/21: R shoulder flexion and abduction are both at least 180 degrees Goal status: ACHIEVED   PLAN: PT FREQUENCY: 1-2x/week  PT DURATION: 8 weeks  PLANNED INTERVENTIONS: Therapeutic exercises, Therapeutic activity, Neuromuscular re-education, Balance training, Gait training, Patient/Family education, Joint manipulation, Joint mobilization, Vestibular training, Canalith repositioning, Aquatic Therapy, Dry Needling, Electrical stimulation, Spinal manipulation, Spinal mobilization, Cryotherapy, Moist heat, Traction, Ultrasound, Ionotophoresis 56m/ml Dexamethasone, and Manual therapy  PLAN FOR NEXT SESSION: Have pt complete DASH, review/modify HEP as necessary, progress strength/ROM per protocol;  JLyndel SafeHuprich PT, DPT, GCS  Aldan Camey 11/25/2021, 1:01 PM

## 2021-11-28 ENCOUNTER — Other Ambulatory Visit: Payer: Self-pay | Admitting: Family Medicine

## 2021-11-28 ENCOUNTER — Ambulatory Visit (INDEPENDENT_AMBULATORY_CARE_PROVIDER_SITE_OTHER): Payer: Medicare Other | Admitting: Family Medicine

## 2021-11-28 ENCOUNTER — Encounter: Payer: Self-pay | Admitting: Family Medicine

## 2021-11-28 VITALS — BP 130/85 | HR 90 | Ht 72.0 in | Wt 316.0 lb

## 2021-11-28 DIAGNOSIS — D510 Vitamin B12 deficiency anemia due to intrinsic factor deficiency: Secondary | ICD-10-CM

## 2021-11-28 DIAGNOSIS — Z0000 Encounter for general adult medical examination without abnormal findings: Secondary | ICD-10-CM

## 2021-11-28 DIAGNOSIS — F419 Anxiety disorder, unspecified: Secondary | ICD-10-CM | POA: Diagnosis not present

## 2021-11-28 DIAGNOSIS — Z9884 Bariatric surgery status: Secondary | ICD-10-CM

## 2021-11-28 DIAGNOSIS — R7309 Other abnormal glucose: Secondary | ICD-10-CM

## 2021-11-28 DIAGNOSIS — E782 Mixed hyperlipidemia: Secondary | ICD-10-CM

## 2021-11-28 DIAGNOSIS — N401 Enlarged prostate with lower urinary tract symptoms: Secondary | ICD-10-CM

## 2021-11-28 DIAGNOSIS — I10 Essential (primary) hypertension: Secondary | ICD-10-CM

## 2021-11-28 DIAGNOSIS — Z8546 Personal history of malignant neoplasm of prostate: Secondary | ICD-10-CM

## 2021-11-28 NOTE — Patient Instructions (Addendum)
Thank you for coming to the office today.  Keep on current therapy.  No change to meds  Keep up with specialists and surgeons.  DUE for FASTING BLOOD WORK (no food or drink after midnight before the lab appointment, only water or coffee without cream/sugar on the morning of)  SCHEDULE "Lab Only" visit in the morning at the clinic for lab draw in 8 MONTHS   - Make sure Lab Only appointment is at about 1 week before your next appointment, so that results will be available  For Lab Results, once available within 2-3 days of blood draw, you can can log in to MyChart online to view your results and a brief explanation. Also, we can discuss results at next follow-up visit.   Please schedule a Follow-up Appointment to: Return in about 8 months (around 07/29/2022) for 8 month fasting lab only then 1 week later Annual Physical.  If you have any other questions or concerns, please feel free to call the office or send a message through [facility]. You may also schedule an earlier appointment if necessary.  Additionally, you may be receiving a survey about your experience at our office within a few days to 1 week by e-mail or mail. We value your feedback.  Nobie Putnam, DO [place]

## 2021-11-28 NOTE — Progress Notes (Signed)
Subjective:    Patient ID: [name], male    DOB: 1955-11-28, 67 y.o.   MRN: [id number]  [name] is a 66 y.o. male presenting on 11/28/2021 for Anxiety   HPI  Mild Anemia, microcytic IDA He had issue with prostate problem caused bleeding for period of time past 2 months, he may have episodic bleeding every 6 months.   Morbid Obesity BMI >42 Hypertension Paroxsymal Atrial Fibrillation Followed by Dr Rockey Situ, [facility]   Weight down 10 additional lbs in past month Weight down to 316 lbs Previous 370 lbs 1.5 years ago   Elevated A1c Lab improved to 5.6, below range of PreDM   Generalized Body Pain Every joint in body, aching and causing pain Followed by Dr Holley Raring at [facility] Previously on Tramadol, then switched to Hydrocodone. Now holding for several days. Down to Gabapentin '400mg'$  daily RF Ablation L side, sciatica has resolved.   Hyperlipidemia Drug induced myopathy He stopped Rosuvastatin '10mg'$  and Zetia '10mg'$  on 01/30/21, and has had dramatic improvement in pain overall No other statin therapy Now last lab shows LDL up to 140 Interested to try other, caution with myalgia   Has the upcoming apt tomorrow for MRI, per Dr Holley Raring was sent to Dr Posey Pronto for Orthopedics. Difficulty working out with shoulders Impacting his sleep waking him up Back on pain medication due to it  Continues to have back flare up pain He has upcoming repeat RAF with [facility] Dr Holley Raring Goal is to delay any spinal / back surgery if at all possible Has walker that allowed him to function and be more mobile. Has enjoyed that.  Insomnia Anxiety  Previously on Mirtazapine, Quetiapine, Belsomra  Trazodone '150mg'$  QHS also takes Tylenol PM Duloxetine  Currently taking Buspar '10mg'$  approx 4 per day, every 4-6 hours depending. Continues on Duloxetine '30mg'$  daily   Follow up Sclerotic Bone Lesion L5 Chronic Back Pain [facility] Dr Holley Raring   History of  Prostate Cancer Previously seen by Dr Maryan [facility] - treated approx 2017 by seed implant, has had negative PSA since that time and done well Recent update with CT imaging showed some abnormal sclerosis, has MRI Reassuring visit from Dr Yves Dill Last PSA <0.04, negative (07/2021) unchanged from last year. Prostate exam normal On tamsulosin for BPH needs re order  R Shoulder surgery, did not need secondary procedure, they were able to manually get hardware back in place. Upcoming L Shoulder in December.       11/28/2021    1:24 PM 10/12/2021   11:36 AM 08/03/2021   10:36 AM  Depression screen PHQ 2/9  Decreased Interest 2 0 2  Down, Depressed, Hopeless 0 0 1  PHQ - 2 Score 2 0 3  Altered sleeping 3  3  Tired, decreased energy 2  3  Change in appetite 0  3  Feeling bad or failure about yourself  0  0  Trouble concentrating 2  2  Moving slowly or fidgety/restless 0  0  Suicidal thoughts 0  0  PHQ-9 Score 9  14  Difficult doing work/chores Somewhat difficult  Very difficult      11/28/2021    1:24 PM 08/03/2021   10:36 AM 05/10/2021    4:05 PM 03/01/2021   11:01 AM  GAD 7 : Generalized Anxiety Score  Nervous, Anxious, on Edge 2 3 0 0  Control/stop worrying 1 0 0 0  Worry too much - different things 1  1 0 0  Trouble relaxing 1 1 0 0  Restless 1 0 0 0  Easily annoyed or irritable 1 1 0 0  Afraid - awful might happen 0 0 0 0  Total GAD 7 Score 7 6 0 0  Anxiety Difficulty Somewhat difficult Somewhat difficult Not difficult at all Not difficult at all      Social History   Tobacco Use   Smoking status: Former    Packs/day: 1.00    Years: 10.00    Total pack years: 10.00    Types: Cigarettes    Quit date: 02/18/1979    Years since quitting: 42.8   Smokeless tobacco: Former    Types: Snuff  Vaping Use   Vaping Use: Never used  Substance Use Topics   Alcohol use: Not Currently    Comment: last drink in 2021   Drug use: No    Review of Systems Per HPI unless  specifically indicated above     Objective:    BP 130/85   Pulse 90   Ht 6' (1.829 m)   Wt (!) 316 lb (143.3 kg)   SpO2 98%   BMI 42.86 kg/m   Wt Readings from Last 3 Encounters:  11/28/21 (!) 316 lb (143.3 kg)  11/15/21 (!) 325 lb (147.4 kg)  11/03/21 (!) 326 lb 1 oz (147.9 kg)    Physical Exam Vitals and nursing note reviewed.  Constitutional:      General: He is not in acute distress.    Appearance: He is well-developed. He is obese. He is not diaphoretic.     Comments: Well-appearing, comfortable, cooperative  HENT:     Head: Normocephalic and atraumatic.  Eyes:     General:        Right eye: No discharge.        Left eye: No discharge.     Conjunctiva/sclera: Conjunctivae normal.  Neck:     Thyroid: No thyromegaly.  Cardiovascular:     Rate and Rhythm: Normal rate and regular rhythm.     Pulses: Normal pulses.     Heart sounds: Normal heart sounds. No murmur heard. Pulmonary:     Effort: Pulmonary effort is normal. No respiratory distress.     Breath sounds: Normal breath sounds. No wheezing or rales.  Musculoskeletal:        General: Normal range of motion.     Cervical back: Normal range of motion and neck supple.  Lymphadenopathy:     Cervical: No cervical adenopathy.  Skin:    General: Skin is warm and dry.     Findings: No erythema or rash.  Neurological:     Mental Status: He is alert and oriented to person, place, and time. Mental status is at baseline.  Psychiatric:        Behavior: Behavior normal.     Comments: Well groomed, good eye contact, normal speech and thoughts      Results for orders placed or performed during the hospital encounter of 11/03/21  Type and screen Order type and screen if day of surgery is less than 15 days from draw of preadmission visit or order morning of surgery if day of surgery is greater than 6 days from preadmission visit.  Result Value Ref Range   ABO/RH(D) O NEG    Antibody Screen NEG    Sample Expiration  11/17/2021,2359    Extend sample reason      NO TRANSFUSIONS OR PREGNANCY IN THE PAST 3 MONTHS Performed at  [facility], [address] [place], [place] [address]       Assessment & Plan:   Problem List Items Addressed This Visit   None Visit Diagnoses     Anxiety    -  Primary       Keep on current therapy. Buspar '5mg'$  x 4 per day, has refills  No change to meds  Keep up with specialists and surgeons.  Upcoming L Shoulder follow-up with surgeons  Lumbar spine RAF upcoming  Continue w/ Cardiology   No orders of the defined types were placed in this encounter.     Follow up plan: Return in about 8 months (around 07/29/2022) for 8 month fasting lab only then 1 week later Annual Physical.  Future labs ordered for 07/2022   Nobie Putnam, [facility] 11/28/2021, 1:30 PM

## 2021-11-30 ENCOUNTER — Ambulatory Visit: Payer: Medicare Other

## 2021-11-30 DIAGNOSIS — M6281 Muscle weakness (generalized): Secondary | ICD-10-CM

## 2021-11-30 DIAGNOSIS — G8929 Other chronic pain: Secondary | ICD-10-CM

## 2021-11-30 DIAGNOSIS — M25511 Pain in right shoulder: Secondary | ICD-10-CM | POA: Diagnosis not present

## 2021-11-30 NOTE — Therapy (Signed)
OUTPATIENT PHYSICAL THERAPY SHOULDER TREATMENT  Patient Name: PAUBLO WARSHAWSKY MRN: [id number] DOB:[birth year], 66 y.o., male Today's Date: 12/01/2021   PT End of Session - 11/30/21 1402     Visit Number 8    Number of Visits 25    Date for PT Re-Evaluation 12/23/21    Authorization Type eval: 09/30/21    PT Start Time 1400    PT Stop Time 1445    PT Time Calculation (min) 45 min    Activity Tolerance Patient tolerated treatment well    Behavior During Therapy [facility] for tasks assessed/performed            Past Medical History:  Diagnosis Date   Aortic atherosclerosis ([place])    Cardiomyopathy (in setting of Afib)    a.) TTE 12/26/2013: EF 45-50%, mild ant and antsept HK. mild MR. Mod dil LA. nl RV fxn. Rhythm was Afib; b.) TTE 07/04/2019: EF 55%, mid-apical anteroseptal HK, mild MAC, mild Ao sclerosis, G2DD, RVSP 45.3   Chronic pain syndrome    a.) followed by pain management   Chronic, continuous use of opioids    a.) hydrocodone/APAP 7.5/325 mg; followed by pain management   Coronary artery disease 11/06/2014   a.) cCTA 11/06/2014: Ca score 224 (all in pLAD) -- 74th percentile for age/sex matched control   Diverticulosis    DJD (degenerative joint disease) of knee    History of hiatal hernia    History of kidney stones 2012   Hyperlipidemia    Hyperplastic colon polyp    Hypertension    Internal hemorrhoids    Intervertebral disc disorder with radiculopathy of lumbosacral region    Long term current use FULL DOSE (325 mg) aspirin    Long term current use of amiodarone    Long term current use of antithrombotics/antiplatelets    a.) rivaroxaban   Lower extremity edema    Mixed hyperlipidemia    Myalgia due to statin    Osteoarthritis    PAF (paroxysmal atrial fibrillation) ([place])    a.) CHA2DS2VASc = 4 (age, HTN, CHF, vascular disease history);  b.) s/p DCCV 03/13/2014 (200 J x1); c.) s/p DCCV 05/15/2019 (150 J x 1, 200 J x2); d.) rate/rhythm maintained on oral amiodarone +  metoprolol succinate; chronically anticoagulated with rivaroxaban   Pernicious anemia    Prostate cancer ([place])    Pulmonary nodule, right    a. 10/2014 Cardiac CTA: 70m RLL nodule; b. 04/2015 CT Chest: stable 779mRLL nodule. No new nodules; 02/2017 CTA Chest: stable, benign, 51m31mLL pulm nodule.   Sleep difficulties    a.) takes melatonin + trazodone PRN   SVT (supraventricular tachycardia)    Tubular adenoma of colon    Past Surgical History:  Procedure Laterality Date   BICEPT TENODESIS Right 09/27/2021   Procedure: Right reverse shoulder arthroplasty, biceps tenodesis;  Surgeon: PatLeim FabryD;  Location: [facility];  Service: Orthopedics;  Laterality: Right;   CARDIOVERSION N/A 05/15/2019   Procedure: CARDIOVERSION;  Surgeon: GolMinna MerrittsD;  Location: [facility];  Service: Cardiovascular;  Laterality: N/A;   CARDIOVERSION N/A 03/13/2014   Procedure: CARDIOVERSION; Location: [facility]: TimIda RogueD   CARPAL TUNNEL RELEASE Right 12/23/2014   Procedure: CARPAL TUNNEL RELEASE;  Surgeon: HowEarnestine LeysD;  Location: [facility];  Service: Orthopedics;  Laterality: Right;   CARPAL TUNNEL RELEASE Left 01/06/2015   Procedure: CARPAL TUNNEL RELEASE;  Surgeon: HowEarnestine LeysD;  Location: [facility];  Service: Orthopedics;  Laterality: Left;  COLONOSCOPY WITH PROPOFOL N/A 10/08/2017   Procedure: COLONOSCOPY WITH PROPOFOL;  Surgeon: Lin Landsman, MD;  Location[facility];  Service: Gastroenterology;  Laterality: N/A;   GASTRIC BYPASS  01/17/2000   KNEE SURGERY Right    knee trauma x3   prostate seeding     REVERSE SHOULDER ARTHROPLASTY Right 09/27/2021   Procedure: Right reverse shoulder arthroplasty, biceps tenodesis;  Surgeon: Leim Fabry, MD;  Location: [facility];  Service: Orthopedics;  Laterality: Right;   Patient Active Problem List   Diagnosis Date Noted   S/p reverse total shoulder arthroplasty 09/27/2021   Lesion of bone of lumbosacral spine (L5) 05/12/2021    Localized osteoarthritis of shoulder regions, bilateral 03/29/2021   Chronic pain of both shoulders 03/29/2021   Drug-induced myopathy 02/21/2021   Trigger finger of right hand 11/15/2020   Prostate cancer ([place]) 07/26/2020   Insomnia 08/01/2019   Primary osteoarthritis of both wrists 02/17/2019   Trigger middle finger of left hand 02/17/2019   Chronic pain syndrome 02/17/2019   Chronic radicular lumbar pain 10/08/2018   Lumbar radiculopathy 10/08/2018   Lumbar degenerative disc disease 10/08/2018   Lumbar facet arthropathy 10/08/2018   Lumbar facet joint syndrome 10/08/2018   Intervertebral disc disorder with radiculopathy of lumbosacral region    Pernicious anemia 05/22/2016   Paroxysmal atrial fibrillation ([place]) 12/20/2015   DJD (degenerative joint disease) of knee 10/27/2014   Bilateral carpal tunnel syndrome 10/27/2014   Morbid obesity with BMI of 50.0-59.9, adult ([place]) 10/27/2014   Mixed hyperlipidemia 10/27/2014   H/O gastric bypass 03/20/2014   Hyperkalemia 03/20/2014   History of prostate cancer 12/26/2013   Essential hypertension 12/26/2013   Encounter for anticoagulation discussion and counseling 12/26/2013   PCP: Olin Hauser, DO  REFERRING PROVIDER: Leim Fabry, MD  REFERRING DIAGNOSIS: M75.121 (ICD-10-CM) - Complete rotator cuff tear or rupture of right shoulder, not specified as traumatic  THERAPY DIAG: Chronic right shoulder pain  Muscle weakness (generalized)  RATIONALE FOR EVALUATION AND TREATMENT: Rehabilitation  ONSET DATE: 09/27/21  FOLLOW UP APPT WITH PROVIDER: Yes, 10/10/21 at 10:15 with Dr. Posey Pronto   FROM INITIAL EVALUATION (09/30/21) SUBJECTIVE:                                                                                                                                                                                         Chief Complaint: Right reverse TSR and right biceps tenodesis on 09/27/21  Pertinent History Pt has a history  of chronic R shoulder pain and failed conservative management. Imaging was consistent with massive, irreparable rotator cuff tear. He underwent right reverse TSR and right biceps tenodesis on 09/27/21. Operative findings  include massive rotator cuff tear (complete supraspinatus, partial infraspinatus, complete subscapularis). No reported post-op complications with the exception of trying to coordinate proper medication management. He was advised by MD that he is allowed to remove his sling to perform pendulums and to bathe. He has been able to perform pendulums since hospital discharge. Pt reports being consistent with ice since discharge. He has been sleeping in chairs as well as in his bed since discharge. He reports using proper support for his RUE. He has a history of chronic back pain which has been worse since admission to the hospital. Pt is wanted to be able to have his L shoulder surgery by the end of this year.   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 Passive RoM allowed to 90 FF and 30 ER. General reverse TSR precautions include no combined shoulder extension, IR, and adduction. No shoulder and elbow AROM (bicep tenodesis) for at least the first 6 weeks to be further  assessed once protocol can be obtained;  Weight Bearing Restrictions: Yes no WB through [place] with: lives with their spouse, granddaughter over frequently (62 years old); Lives in: House/apartment   Patient Goals: Improve R shoulder function, return to working in the yard;  OBJECTIVE:   Patient Surveys  FOTO: 20, predicted improvement to 78 QuickDASH: Deferred  Cognition Patient is oriented to person, place, and time.  Recent memory is intact.  Remote memory is intact.  Attention span and concentration are intact.  Expressive speech is intact.  Patient's fund of knowledge is within normal limits for educational level.    Gross Musculoskeletal Assessment Tremor: None Bulk: Normal Tone: Normal Incision is dry and clean without excessive warmth, redness, or swelling noted around R shoulder;  Gait Deferred  Posture Forward head and rounded shoulders;  Cervical Screen AROM: WFL and painless with overpressure in all planes Spurlings A (ipsilateral lateral flexion/axial compression): R: Not examined L: Not examined Spurlings B (ipsilateral lateral flexion/contralateral rotation/axial compression): R: Not examined L: Not examined Repeated movement: No centralization or peripheralization with protraction or retraction Hoffman Sign (cervical cord compression): R: Not examined L: Not examined ULTT Median: R: Not examined L: Not examined ULTT Ulnar: R: Not examined L: Not examined ULTT Radial: R: Not examined L: Not examined  AROM  AROM (Normal range in degrees) AROM 12/01/2021  Cervical  Flexion (50) WFL  Extension (80) WFL  Right lateral flexion (45) WFL  Left lateral flexion (45) WFL  Right rotation (85) WFL  Left rotation (85) WFL   Right Left  Shoulder    Flexion Approx 60 (PROM)*   Extension    Abduction Approx 70 (PROM)*   External Rotation 0 (PROM)*   Internal Rotation Palm to stomach at neutral shoulder   Hands Behind Head    Hands  Behind Back        Elbow  Flexion Full (PROM)   Extension 0 (PROM)   Pronation Full (PROM)   Supination Full (PROM)   (* = pain; Blank rows = not tested)  UE MMT:  MMT (out of 5) Right 10/01/2021 Left 10/01/2021  Cervical (isometric)  Flexion   Extension   Lateral Flexion    Rotation        Shoulder   Flexion    Extension    Abduction    External rotation    Internal rotation    Horizontal abduction    Horizontal adduction    Lower Trapezius    Rhomboids        Elbow  Flexion    Extension    Pronation    Supination        Wrist  Flexion 5 5  Extension 5 5  Radial deviation 5 5  Ulnar deviation 5 5      MCP  Flexion 5 5  Extension 5 5  Abduction 5 5  Adduction 5 5  (* = pain; Blank rows = not tested)  Grip: Strong and roughly symmetrical BUE  Sensation Grossly intact to light touch bilateral UE as determined by testing dermatomes C2-T2. Proprioception and hot/cold testing deferred on this date.  Reflexes Deferred  Palpation  Location LEFT  RIGHT           Subocciptials    Cervical paraspinals    Upper Trapezius    Levator Scapulae    Rhomboid Major/Minor    Sternoclavicular joint    Acromioclavicular joint    Coracoid process    Long head of biceps    Supraspinatus    Infraspinatus    Subscapularis    Teres Minor    Teres Major    Pectoralis Major    Pectoralis Minor    Anterior Deltoid  1  Lateral Deltoid  0  Posterior Deltoid  0  Latissimus Dorsi    Sternocleidomastoid    (Blank rows = not tested) Graded on 0-4 scale (0 = no pain, 1 = pain, 2 = pain with wincing/grimacing/flinching, 3 = pain with withdrawal, 4 = unwilling to allow palpation), (Blank rows = not tested)  Repeated Movements Deferred  Passive Accessory Intervertebral Motion Deferred  Accessory Motions/Glides Muscle Length Testing: Deferred  Special Tests: Deferred  Beighton scale: Deferred   TODAY'S TREATMENT    SUBJECTIVE: Pt reports that he is doing  well today. He denies any R shoulder pain at rest today. He denies any episodes of subluxation/dislocation since the last therapy session. He continues to have full R shoulder AROM flexion and only has minimal pain end range. No specific questions or concerns currently.   PAIN: Denies resting R shoulder pain   Ther-ex   All exercises performed in semirecumbent position: R shoulder flexion from neutral to 90 degrees with 1# dumbbell 2 x 10 (attempted 2# dumbbell but regressed due to gradual increase in pain); R shoulder serratus punch with 1# dumbbell 2 x 10; R shoulder circles with 1# dumbbell CW/CCW 2 x 10 each direction; PVC chest press with manual resistance 2 x 10;  All exercises performed in L sidelying position: R shoulder ER with 1# dumbbell 2 x 10; R shoulder abduction from neutral to 90 with 1# dumbbell 2 x 10;  Seated PVC rows with manual resistance 2 x 10; Seated R elbow flexion with 2# dumbbell 2 x 10; Seated bilateral shoulder flexion to 90 degrees x 10 (unweighted);   Manual Therapy  STM to R upper  trap and rhomboid major minor to decrease tension;    PATIENT EDUCATION:  Education details: Pt educated throughout session about proper posture and technique with exercises. Improved exercise technique, movement at target joints, use of target muscles after min to mod verbal, visual, tactile cues.  Person educated: Patient Education method: Explanation, verbal cues, tactile cues Education comprehension: verbalized understanding and returned demonstration   HOME EXERCISE PROGRAM: Access Code: [email or web address] URL: [email or web address] Date: 11/25/2021 Prepared by: Roxana Hires  Exercises - Seated Cervical Sidebending Stretch  - 2 x daily - 7 x weekly - 3 reps - 30s hold - Seated Scapular Retraction  - 2 x daily - 7 x weekly - 2 sets - 10 reps - 3s hold - Seated Shoulder Row with Anchored Resistance  - 2 x daily - 7 x weekly - 2 sets - 10 reps - Seated  Bicep Curls Supinated with Dumbbells  - 2 x daily - 7 x weekly - 2 sets - 10 reps   ASSESSMENT:  CLINICAL IMPRESSION: Pt continue to reports some mild pain at end range R shoulder flexion. Continued light strengthening below 90 degrees with patient during visit today. He is able to complete all exercises with appropriate muscle fatigue and no pain. No HEP updates at this time. Pt encouraged to follow-up as scheduled. He will benefit from skilled PT to address above impairments and improve overall function.  REHAB POTENTIAL: Good  CLINICAL DECISION MAKING: Unstable/unpredictable  EVALUATION COMPLEXITY: High   GOALS: Goals reviewed with patient? Yes  SHORT TERM GOALS: Target date: 12/29/2021  Pt will be independent with HEP to improve strength and decrease shoulder pain to improve pain-free function at home and when working in the yard Baseline:  Goal status: INITIAL   LONG TERM GOALS: Target date: 01/26/2022  Pt will increase FOTO to at least 54 to demonstrate significant improvement in function at home and work related to neck pain  Baseline: 09/30/21: 20 Goal status: INITIAL  2.  Pt will decrease worst shoulder pain by at least 3 points on the NPRS in order to demonstrate clinically significant reduction in shoulder pain. Baseline: 09/30/21: worst 10/10 Goal status: INITIAL  3.  Pt will decrease quick DASH score by at least 8% in order to demonstrate clinically significant reduction in disability related to shoulder pain        Baseline: 09/30/21: To be completed Goal status: INITIAL  4. Pt will increase strength of R shoulder flexion and abduction to at least 4/5 in order to improve R shoulder strength and function so he can complete his household responsibilities and work in the yard      Baseline: 09/30/21: Unable to test; Goal status: INITIAL  5. Pt will increase R shoulder AROM flexion and abduction to at least 120 each in to improve R shoulder function so he can complete  his household responsibilities and work in the yard      Baseline: 09/30/21: Unable to test; 11/25/21: R shoulder flexion and abduction are both at least 180 degrees Goal status: ACHIEVED   PLAN: PT FREQUENCY: 1-2x/week  PT DURATION: 8 weeks  PLANNED INTERVENTIONS: Therapeutic exercises, Therapeutic activity, Neuromuscular re-education, Balance training, Gait training, Patient/Family education, Joint manipulation, Joint mobilization, Vestibular training, Canalith repositioning, Aquatic Therapy, Dry Needling, Electrical stimulation, Spinal manipulation, Spinal mobilization, Cryotherapy, Moist heat, Traction, Ultrasound, Ionotophoresis 23m/ml Dexamethasone, and Manual therapy  PLAN FOR NEXT SESSION: Have pt complete DASH, review/modify HEP as necessary, progress strength/ROM per protocol;  JLyndel SafeHuprich PT, DPT, GCS  Sruthi Maurer 12/01/2021, 10:01 AM

## 2021-12-01 NOTE — Therapy (Signed)
OUTPATIENT PHYSICAL THERAPY SHOULDER TREATMENT  Patient Name: [name] MRN: [id number] DOB:01-22-1955, 65 y.o., male Today's Date: 12/02/2021   PT End of Session - 12/02/21 1105     Visit Number 9    Number of Visits 25    Date for PT Re-Evaluation 12/23/21    Authorization Type eval: 09/30/21    PT Start Time 1100    PT Stop Time 1145    PT Time Calculation (min) 45 min    Activity Tolerance Patient tolerated treatment well    Behavior During Therapy WFL for tasks assessed/performed            Past Medical History:  Diagnosis Date   Aortic atherosclerosis ([place])    Cardiomyopathy (in setting of Afib)    a.) TTE 12/26/2013: EF 45-50%, mild ant and antsept HK. mild MR. Mod dil LA. nl RV fxn. Rhythm was Afib; b.) TTE 07/04/2019: EF 55%, mid-apical anteroseptal HK, mild MAC, mild Ao sclerosis, G2DD, RVSP 45.3   Chronic pain syndrome    a.) followed by pain management   Chronic, continuous use of opioids    a.) hydrocodone/APAP 7.5/325 mg; followed by pain management   Coronary artery disease 11/06/2014   a.) cCTA 11/06/2014: Ca score 224 (all in pLAD) -- 74th percentile for age/sex matched control   Diverticulosis    DJD (degenerative joint disease) of knee    History of hiatal hernia    History of kidney stones 2012   Hyperlipidemia    Hyperplastic colon polyp    Hypertension    Internal hemorrhoids    Intervertebral disc disorder with radiculopathy of lumbosacral region    Long term current use FULL DOSE (325 mg) aspirin    Long term current use of amiodarone    Long term current use of antithrombotics/antiplatelets    a.) rivaroxaban   Lower extremity edema    Mixed hyperlipidemia    Myalgia due to statin    Osteoarthritis    PAF (paroxysmal atrial fibrillation) ([place])    a.) CHA2DS2VASc = 4 (age, HTN, CHF, vascular disease history);  b.) s/p DCCV 03/13/2014 (200 J x1); c.) s/p DCCV 05/15/2019 (150 J x 1, 200 J x2); d.) rate/rhythm maintained on oral amiodarone +  metoprolol succinate; chronically anticoagulated with rivaroxaban   Pernicious anemia    Prostate cancer ([place])    Pulmonary nodule, right    a. 10/2014 Cardiac CTA: 33m RLL nodule; b. 04/2015 CT Chest: stable 7109mRLL nodule. No new nodules; 02/2017 CTA Chest: stable, benign, 91m58mLL pulm nodule.   Sleep difficulties    a.) takes melatonin + trazodone PRN   SVT (supraventricular tachycardia)    Tubular adenoma of colon    Past Surgical History:  Procedure Laterality Date   BICEPT TENODESIS Right 09/27/2021   Procedure: Right reverse shoulder arthroplasty, biceps tenodesis;  Surgeon: PatLeim FabryD;  Location: [facility] OR[facility];  Service: Orthopedics;  Laterality: Right;   CARDIOVERSION N/A 05/15/2019   Procedure: CARDIOVERSION;  Surgeon: GolMinna MerrittsD;  Location: [facility];  Service: Cardiovascular;  Laterality: N/A;   CARDIOVERSION N/A 03/13/2014   Procedure: CARDIOVERSION; [facility] TimIda RogueD   CARPAL TUNNEL RELEASE Right 12/23/2014   Procedure: CARPAL TUNNEL RELEASE;  Surgeon: HowEarnestine LeysD;  Location: [facility];  Service: Orthopedics;  Laterality: Right;   CARPAL TUNNEL RELEASE Left 01/06/2015   Procedure: CARPAL TUNNEL RELEASE;  Surgeon: HowEarnestine LeysD;  Location: [facility];  Service: Orthopedics;  Laterality: Left;  COLONOSCOPY WITH PROPOFOL N/A 10/08/2017   Procedure: COLONOSCOPY WITH PROPOFOL;  Surgeon: Lin Landsman, MD;  Location[facility];  Service: Gastroenterology;  Laterality: N/A;   GASTRIC BYPASS  01/17/2000   KNEE SURGERY Right    knee trauma x3   prostate seeding     REVERSE SHOULDER ARTHROPLASTY Right 09/27/2021   Procedure: Right reverse shoulder arthroplasty, biceps tenodesis;  Surgeon: Leim Fabry, MD;  Location: [facility];  Service: Orthopedics;  Laterality: Right;   Patient Active Problem List   Diagnosis Date Noted   S/p reverse total shoulder arthroplasty 09/27/2021   Lesion of bone of lumbosacral spine (L5) 05/12/2021    Localized osteoarthritis of shoulder regions, bilateral 03/29/2021   Chronic pain of both shoulders 03/29/2021   Drug-induced myopathy 02/21/2021   Trigger finger of right hand 11/15/2020   Prostate cancer ([place]) 07/26/2020   Insomnia 08/01/2019   Primary osteoarthritis of both wrists 02/17/2019   Trigger middle finger of left hand 02/17/2019   Chronic pain syndrome 02/17/2019   Chronic radicular lumbar pain 10/08/2018   Lumbar radiculopathy 10/08/2018   Lumbar degenerative disc disease 10/08/2018   Lumbar facet arthropathy 10/08/2018   Lumbar facet joint syndrome 10/08/2018   Intervertebral disc disorder with radiculopathy of lumbosacral region    Pernicious anemia 05/22/2016   Paroxysmal atrial fibrillation ([place]) 12/20/2015   DJD (degenerative joint disease) of knee 10/27/2014   Bilateral carpal tunnel syndrome 10/27/2014   Morbid obesity with BMI of 50.0-59.9, adult ([place]) 10/27/2014   Mixed hyperlipidemia 10/27/2014   H/O gastric bypass 03/20/2014   Hyperkalemia 03/20/2014   History of prostate cancer 12/26/2013   Essential hypertension 12/26/2013   Encounter for anticoagulation discussion and counseling 12/26/2013   PCP: Olin Hauser, DO  REFERRING PROVIDER: Leim Fabry, MD  REFERRING DIAGNOSIS: M75.121 (ICD-10-CM) - Complete rotator cuff tear or rupture of right shoulder, not specified as traumatic  THERAPY DIAG: Chronic right shoulder pain  Muscle weakness (generalized)  RATIONALE FOR EVALUATION AND TREATMENT: Rehabilitation  ONSET DATE: 09/27/21  FOLLOW UP APPT WITH PROVIDER: Yes, 10/10/21 at 10:15 with Dr. Posey Pronto   FROM INITIAL EVALUATION (09/30/21) SUBJECTIVE:                                                                                                                                                                                         Chief Complaint: Right reverse TSR and right biceps tenodesis on 09/27/21  Pertinent History Pt has a history  of chronic R shoulder pain and failed conservative management. Imaging was consistent with massive, irreparable rotator cuff tear. He underwent right reverse TSR and right biceps tenodesis on 09/27/21. Operative findings  include massive rotator cuff tear (complete supraspinatus, partial infraspinatus, complete subscapularis). No reported post-op complications with the exception of trying to coordinate proper medication management. He was advised by MD that he is allowed to remove his sling to perform pendulums and to bathe. He has been able to perform pendulums since hospital discharge. Pt reports being consistent with ice since discharge. He has been sleeping in chairs as well as in his bed since discharge. He reports using proper support for his RUE. He has a history of chronic back pain which has been worse since admission to the hospital. Pt is wanted to be able to have his L shoulder surgery by the end of this year.   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 Passive RoM allowed to 90 FF and 30 ER. General reverse TSR precautions include no combined shoulder extension, IR, and adduction. No shoulder and elbow AROM (bicep tenodesis) for at least the first 6 weeks to be further  assessed once protocol can be obtained;  Weight Bearing Restrictions: Yes no WB through [place] with: lives with their spouse, granddaughter over frequently (18 years old); Lives in: House/apartment   Patient Goals: Improve R shoulder function, return to working in the yard;  OBJECTIVE:   Patient Surveys  FOTO: 20, predicted improvement to 75 QuickDASH: Deferred  Cognition Patient is oriented to person, place, and time.  Recent memory is intact.  Remote memory is intact.  Attention span and concentration are intact.  Expressive speech is intact.  Patient's fund of knowledge is within normal limits for educational level.    Gross Musculoskeletal Assessment Tremor: None Bulk: Normal Tone: Normal Incision is dry and clean without excessive warmth, redness, or swelling noted around R shoulder;  Gait Deferred  Posture Forward head and rounded shoulders;  Cervical Screen AROM: WFL and painless with overpressure in all planes Spurlings A (ipsilateral lateral flexion/axial compression): R: Not examined L: Not examined Spurlings B (ipsilateral lateral flexion/contralateral rotation/axial compression): R: Not examined L: Not examined Repeated movement: No centralization or peripheralization with protraction or retraction Hoffman Sign (cervical cord compression): R: Not examined L: Not examined ULTT Median: R: Not examined L: Not examined ULTT Ulnar: R: Not examined L: Not examined ULTT Radial: R: Not examined L: Not examined  AROM  AROM (Normal range in degrees) AROM 12/02/2021  Cervical  Flexion (50) WFL  Extension (80) WFL  Right lateral flexion (45) WFL  Left lateral flexion (45) WFL  Right rotation (85) WFL  Left rotation (85) WFL   Right Left  Shoulder    Flexion Approx 60 (PROM)*   Extension    Abduction Approx 70 (PROM)*   External Rotation 0 (PROM)*   Internal Rotation Palm to stomach at neutral shoulder   Hands Behind Head    Hands  Behind Back        Elbow  Flexion Full (PROM)   Extension 0 (PROM)   Pronation Full (PROM)   Supination Full (PROM)   (* = pain; Blank rows = not tested)  UE MMT:  MMT (out of 5) Right 10/01/2021 Left 10/01/2021  Cervical (isometric)  Flexion   Extension   Lateral Flexion    Rotation        Shoulder   Flexion    Extension    Abduction    External rotation    Internal rotation    Horizontal abduction    Horizontal adduction    Lower Trapezius    Rhomboids        Elbow  Flexion    Extension    Pronation    Supination        Wrist  Flexion 5 5  Extension 5 5  Radial deviation 5 5  Ulnar deviation 5 5      MCP  Flexion 5 5  Extension 5 5  Abduction 5 5  Adduction 5 5  (* = pain; Blank rows = not tested)  Grip: Strong and roughly symmetrical BUE  Sensation Grossly intact to light touch bilateral UE as determined by testing dermatomes C2-T2. Proprioception and hot/cold testing deferred on this date.  Reflexes Deferred  Palpation  Location LEFT  RIGHT           Subocciptials    Cervical paraspinals    Upper Trapezius    Levator Scapulae    Rhomboid Major/Minor    Sternoclavicular joint    Acromioclavicular joint    Coracoid process    Long head of biceps    Supraspinatus    Infraspinatus    Subscapularis    Teres Minor    Teres Major    Pectoralis Major    Pectoralis Minor    Anterior Deltoid  1  Lateral Deltoid  0  Posterior Deltoid  0  Latissimus Dorsi    Sternocleidomastoid    (Blank rows = not tested) Graded on 0-4 scale (0 = no pain, 1 = pain, 2 = pain with wincing/grimacing/flinching, 3 = pain with withdrawal, 4 = unwilling to allow palpation), (Blank rows = not tested)  Repeated Movements Deferred  Passive Accessory Intervertebral Motion Deferred  Accessory Motions/Glides Muscle Length Testing: Deferred  Special Tests: Deferred  Beighton scale: Deferred   TODAY'S TREATMENT    SUBJECTIVE: Pt reports that he is doing  alright today. He is having considerable R shoulder soreness upon arrival having worked on fixing his mower yesterday. He also picked up his grandson and forgot about his lifting restrictions. He denies any episodes of subluxation/dislocation since the last therapy session. He continues to have full R shoulder AROM flexion and only has minimal pain end range. No specific questions or concerns currently.   PAIN: R shoulder soreness   Ther-ex  All exercises performed in semirecumbent position: R shoulder flexion from neutral to 90 degrees with 2# dumbbell 2 x 10; R shoulder serratus punch with 2# dumbbell 2 x 10; R shoulder circles with 2# dumbbell CW/CCW 2 x 10 each direction; R shoulder rhythmic stabilization with resistance at wrist 2 x 30s; PVC chest press with manual resistance 2 x 10;  All exercises performed in L sidelying position: R shoulder ER with 2# dumbbell 2 x 10; R shoulder abduction from neutral to 90 with 2# dumbbell 2 x 10; R scapular retraction with manual resistance 2 x 10;  Seated PVC rows with manual resistance 2 x 10; Seated R elbow flexion  with 2# dumbbell 2 x 10; Seated bilateral shoulder flexion holding cane to 90 degrees 2 x 10;   Manual Therapy  R shoulder PROM into flexion and abduction x multiple bouts each direction never pushing through pain or resistance;    PATIENT EDUCATION:  Education details: Pt educated throughout session about proper posture and technique with exercises. Improved exercise technique, movement at target joints, use of target muscles after min to mod verbal, visual, tactile cues.  Person educated: Patient Education method: Explanation, verbal cues, tactile cues Education comprehension: verbalized understanding and returned demonstration   HOME EXERCISE PROGRAM: Access Code: [email or web address] URL: [email or web address] Date: 11/25/2021 Prepared by: Roxana Hires  Exercises - Seated Cervical Sidebending Stretch  - 2 x  daily - 7 x weekly - 3 reps - 30s hold - Seated Scapular Retraction  - 2 x daily - 7 x weekly - 2 sets - 10 reps - 3s hold - Seated Shoulder Row with Anchored Resistance  - 2 x daily - 7 x weekly - 2 sets - 10 reps - Seated Bicep Curls Supinated with Dumbbells  - 2 x daily - 7 x weekly - 2 sets - 10 reps   ASSESSMENT:  CLINICAL IMPRESSION: Pt more sore today having worked on Warden/ranger his mower yesterday. He also lifted his grandson having forgot about his lifting restrictions. Continued light strengthening below 90 degrees with patient during visit today and increased weight to 2# dumbbells. He is able to use the dumbbells without any increase in pain. Pt is able to complete all exercises with appropriate muscle fatigue. No HEP updates at this time. Pt encouraged to follow-up as scheduled. He will benefit from skilled PT to address above impairments and improve overall function.  REHAB POTENTIAL: Good  CLINICAL DECISION MAKING: Unstable/unpredictable  EVALUATION COMPLEXITY: High   GOALS: Goals reviewed with patient? Yes  SHORT TERM GOALS: Target date: 12/30/2021  Pt will be independent with HEP to improve strength and decrease shoulder pain to improve pain-free function at home and when working in the yard Baseline:  Goal status: INITIAL   LONG TERM GOALS: Target date: 01/27/2022  Pt will increase FOTO to at least 54 to demonstrate significant improvement in function at home and work related to neck pain  Baseline: 09/30/21: 20 Goal status: INITIAL  2.  Pt will decrease worst shoulder pain by at least 3 points on the NPRS in order to demonstrate clinically significant reduction in shoulder pain. Baseline: 09/30/21: worst 10/10 Goal status: INITIAL  3.  Pt will decrease quick DASH score by at least 8% in order to demonstrate clinically significant reduction in disability related to shoulder pain        Baseline: 09/30/21: To be completed Goal status: INITIAL  4. Pt will increase  strength of R shoulder flexion and abduction to at least 4/5 in order to improve R shoulder strength and function so he can complete his household responsibilities and work in the yard      Baseline: 09/30/21: Unable to test; Goal status: INITIAL  5. Pt will increase R shoulder AROM flexion and abduction to at least 120 each in to improve R shoulder function so he can complete his household responsibilities and work in the yard      Baseline: 09/30/21: Unable to test; 11/25/21: R shoulder flexion and abduction are both at least 180 degrees Goal status: ACHIEVED   PLAN: PT FREQUENCY: 1-2x/week  PT DURATION: 8 weeks  PLANNED INTERVENTIONS: Therapeutic exercises, Therapeutic activity, Neuromuscular re-education, Balance training,  Gait training, Patient/Family education, Joint manipulation, Joint mobilization, Vestibular training, Canalith repositioning, Aquatic Therapy, Dry Needling, Electrical stimulation, Spinal manipulation, Spinal mobilization, Cryotherapy, Moist heat, Traction, Ultrasound, Ionotophoresis 55m/ml Dexamethasone, and Manual therapy  PLAN FOR NEXT SESSION: Have pt complete DASH, review/modify HEP as necessary, progress strength/ROM per protocol;  JLyndel SafeHuprich PT, DPT, GCS  Dejia Ebron 12/02/2021, 2:11 PM

## 2021-12-02 ENCOUNTER — Ambulatory Visit: Payer: Medicare Other

## 2021-12-02 DIAGNOSIS — M6281 Muscle weakness (generalized): Secondary | ICD-10-CM | POA: Diagnosis not present

## 2021-12-02 DIAGNOSIS — M25511 Pain in right shoulder: Secondary | ICD-10-CM | POA: Diagnosis not present

## 2021-12-02 DIAGNOSIS — G8929 Other chronic pain: Secondary | ICD-10-CM

## 2021-12-05 ENCOUNTER — Other Ambulatory Visit: Payer: Self-pay | Admitting: Orthopedic Surgery

## 2021-12-05 DIAGNOSIS — M75112 Incomplete rotator cuff tear or rupture of left shoulder, not specified as traumatic: Secondary | ICD-10-CM | POA: Diagnosis not present

## 2021-12-07 ENCOUNTER — Ambulatory Visit
Payer: Medicare Other | Attending: Student in an Organized Health Care Education/Training Program | Admitting: Student in an Organized Health Care Education/Training Program

## 2021-12-07 ENCOUNTER — Ambulatory Visit
Admission: RE | Admit: 2021-12-07 | Discharge: 2021-12-07 | Disposition: A | Discharge status: Home / Self Care | Payer: Medicare Other | Source: Ambulatory Visit | Attending: Student in an Organized Health Care Education/Training Program | Admitting: Student in an Organized Health Care Education/Training Program

## 2021-12-07 ENCOUNTER — Encounter: Payer: Self-pay | Admitting: Student in an Organized Health Care Education/Training Program

## 2021-12-07 DIAGNOSIS — M47816 Spondylosis without myelopathy or radiculopathy, lumbar region: Secondary | ICD-10-CM

## 2021-12-07 DIAGNOSIS — G894 Chronic pain syndrome: Secondary | ICD-10-CM | POA: Insufficient documentation

## 2021-12-07 MED ORDER — DEXAMETHASONE SODIUM PHOSPHATE 10 MG/ML IJ SOLN
10.0000 mg | Freq: Once | INTRAMUSCULAR | Status: AC
  Administered 2021-12-07: 10 mg
  Filled 2021-12-07: qty 1

## 2021-12-07 MED ORDER — DIAZEPAM 5 MG PO TABS
10.0000 mg | ORAL_TABLET | ORAL | Status: AC
  Administered 2021-12-07: 10 mg via ORAL

## 2021-12-07 MED ORDER — ROPIVACAINE HCL 2 MG/ML IJ SOLN
9.0000 mL | Freq: Once | INTRAMUSCULAR | Status: AC
  Administered 2021-12-07: 9 mL via PERINEURAL
  Filled 2021-12-07: qty 20

## 2021-12-07 MED ORDER — DIAZEPAM 5 MG PO TABS
ORAL_TABLET | ORAL | Status: AC
  Filled 2021-12-07: qty 2

## 2021-12-07 MED ORDER — LIDOCAINE HCL 2 % IJ SOLN
20.0000 mL | Freq: Once | INTRAMUSCULAR | Status: AC
  Administered 2021-12-07: 400 mg
  Filled 2021-12-07: qty 20

## 2021-12-07 NOTE — Patient Instructions (Signed)

## 2021-12-07 NOTE — Progress Notes (Signed)
PROVIDER NOTE: Interpretation of information contained herein should be left to medically-trained personnel. Specific patient instructions are provided elsewhere under "Patient Instructions" section of medical record. This document was created in part using STT-dictation technology, any transcriptional errors that may result from this process are unintentional.  Patient: [name] Type: Established DOB: Jun 29, 1955 MRN: [id number] PCP: Olin Hauser, DO  Service: Procedure DOS: 12/07/2021 Setting: Ambulatory Location: Ambulatory outpatient facility Delivery: Face-to-face Provider: Gillis Santa, MD Specialty: Interventional Pain Management Specialty designation: 09 Location: Outpatient facility Ref. Prov.: Gillis Santa, MD    Primary Reason for Visit: Interventional Pain Management Treatment. CC: Back Pain (lower)  Procedure:           Type: Lumbar Facet, Medial Branch Radiofrequency Ablation (RFA) #2  Laterality: Left  Level: L3, L4, L5, Medial Branch Level(s). These levels will denervate the L3-4 and L5-S1 lumbar facet joints.  Imaging: Fluoroscopic guidance Anesthesia: Local anesthesia (1-2% Lidocaine) Anxiolysis: 10 mg PO Valium minimal) DOS: 12/07/2021  Performed by: Gillis Santa, MD  Purpose: Therapeutic/Palliative Indications: Low back pain severe enough to impact quality of life or function. Indications: 1. Lumbar facet arthropathy   2. Lumbar facet joint syndrome   3. Chronic pain syndrome    [name] has been dealing with the above chronic pain for longer than three months and has either failed to respond, was unable to tolerate, or simply did not get enough benefit from other more conservative therapies including, but not limited to: 1. Over-the-counter medications 2. Anti-inflammatory medications 3. Muscle relaxants 4. Membrane stabilizers 5. Opioids 6. Physical therapy and/or chiropractic manipulation 7. Modalities (Heat, ice, etc.) 8. Invasive  techniques such as nerve blocks. [name] has attained more than 50% relief of the pain from a series of diagnostic injections conducted in separate occasions.  Pain Score: Pre-procedure: 5 /10 Post-procedure: 0-No pain/10     Patient discontinued his Xarelto 3 days prior.  Position / Prep / Materials:  Position: Prone  Prep solution: DuraPrep (Iodine Povacrylex [0.7% available iodine] and Isopropyl Alcohol, 74% w/w) Prep Area: Entire Lumbosacral Region (Lower back from mid-thoracic region to end of tailbone and from flank to flank.) Materials:  Tray: RFA (Radiofrequency) tray Needle(s):  Type: RFA (Teflon-coated radiofrequency ablation needles) Gauge (G): 22  Length: Long (15cm) Qty: 3  Pre-op H&P Assessment:  [name] is a 66 y.o. (year old), male patient, seen today for interventional treatment. He  has a past surgical history that includes Knee surgery (Right); Gastric bypass (01/17/2000); prostate seeding; Carpal tunnel release (Right, 12/23/2014); Carpal tunnel release (Left, 01/06/2015); Colonoscopy with propofol (N/A, 10/08/2017); Cardioversion (N/A, 05/15/2019); Cardioversion (N/A, 03/13/2014); Reverse shoulder arthroplasty (Right, 09/27/2021); and Bicept tenodesis (Right, 09/27/2021). [name] has a current medication list which includes the following prescription(s): amiodarone, benazepril, buspirone, carboxymethylcellulose, cyanocobalamin, cyclobenzaprine, duloxetine, repatha, hydrocodone-acetaminophen, [START ON 12/21/2021] hydrocodone-acetaminophen, [START ON 01/20/2022] hydrocodone-acetaminophen, ibuprofen, melatonin, metoprolol succinate, multivitamin with minerals, ondansetron, oxycodone, xarelto, trazodone, and amlodipine. His primarily concern today is the Back Pain (lower)  Initial Vital Signs:  Pulse/HCG Rate:  76ECG Heart Rate: 74 Temp:  (!) 97 F (36.1 C) Resp: 18 BP:  137/81 SpO2: 100 %  BMI: Estimated body mass index is 43.4 kg/m as calculated from the  following:   Height as of this encounter: 6' (1.829 m).   Weight as of this encounter: 320 lb (145.2 kg).  Risk Assessment: Allergies: Reviewed. He has No Known Allergies.  Allergy Precautions: None required Coagulopathies: Reviewed. None identified.  Blood-thinner therapy: None at this  time Active Infection(s): Reviewed. None identified. [name] is afebrile  Site Confirmation: [name] was asked to confirm the procedure and laterality before marking the site Procedure checklist: Completed Consent: Before the procedure and under the influence of no sedative(s), amnesic(s), or anxiolytics, the patient was informed of the treatment options, risks and possible complications. To fulfill our ethical and legal obligations, as recommended by the American Medical Association's Code of Ethics, I have informed the patient of my clinical impression; the nature and purpose of the treatment or procedure; the risks, benefits, and possible complications of the intervention; the alternatives, including doing nothing; the risk(s) and benefit(s) of the alternative treatment(s) or procedure(s); and the risk(s) and benefit(s) of doing nothing. The patient was provided information about the general risks and possible complications associated with the procedure. These may include, but are not limited to: failure to achieve desired goals, infection, bleeding, organ or nerve damage, allergic reactions, paralysis, and death. In addition, the patient was informed of those risks and complications associated to Spine-related procedures, such as failure to decrease pain; infection (i.e.: Meningitis, epidural or intraspinal abscess); bleeding (i.e.: epidural hematoma, subarachnoid hemorrhage, or any other type of intraspinal or peri-dural bleeding); organ or nerve damage (i.e.: Any type of peripheral nerve, nerve root, or spinal cord injury) with subsequent damage to sensory, motor, and/or autonomic systems, resulting in  permanent pain, numbness, and/or weakness of one or several areas of the body; allergic reactions; (i.e.: anaphylactic reaction); and/or death. Furthermore, the patient was informed of those risks and complications associated with the medications. These include, but are not limited to: allergic reactions (i.e.: anaphylactic or anaphylactoid reaction(s)); adrenal axis suppression; blood sugar elevation that in diabetics may result in ketoacidosis or comma; water retention that in patients with history of congestive heart failure may result in shortness of breath, pulmonary edema, and decompensation with resultant heart failure; weight gain; swelling or edema; medication-induced neural toxicity; particulate matter embolism and blood vessel occlusion with resultant organ, and/or nervous system infarction; and/or aseptic necrosis of one or more joints. Finally, the patient was informed that Medicine is not an exact science; therefore, there is also the possibility of unforeseen or unpredictable risks and/or possible complications that may result in a catastrophic outcome. The patient indicated having understood very clearly. We have given the patient no guarantees and we have made no promises. Enough time was given to the patient to ask questions, all of which were answered to the patient's satisfaction. [name] has indicated that he wanted to continue with the procedure. Attestation: I, the ordering provider, attest that I have discussed with the patient the benefits, risks, side-effects, alternatives, likelihood of achieving goals, and potential problems during recovery for the procedure that I have provided informed consent. Date  Time: 12/07/2021  9:52 AM  Pre-Procedure Preparation:  Monitoring: As per clinic protocol. Respiration, ETCO2, SpO2, BP, heart rate and rhythm monitor placed and checked for adequate function Safety Precautions: Patient was assessed for positional comfort and pressure points  before starting the procedure. Time-out: I initiated and conducted the "Time-out" before starting the procedure, as per protocol. The patient was asked to participate by confirming the accuracy of the "Time Out" information. Verification of the correct person, site, and procedure were performed and confirmed by me, the nursing staff, and the patient. "Time-out" conducted as per Joint Commission's Universal Protocol (UP.01.01.01). Time: 1023  Description of Procedure:          Laterality: Left Levels:  L3, L4, L5, Medial  Branch Level(s), at the L3-4 and L5-S1 lumbar facet joints. Safety Precautions: Aspiration looking for blood return was conducted prior to all injections. At no point did we inject any substances, as a needle was being advanced. Before injecting, the patient was told to immediately notify me if he was experiencing any new onset of "ringing in the ears, or metallic taste in the mouth". No attempts were made at seeking any paresthesias. Safe injection practices and needle disposal techniques used. Medications properly checked for expiration dates. SDV (single dose vial) medications used. After the completion of the procedure, all disposable equipment used was discarded in the proper designated medical waste containers. Local Anesthesia: Protocol guidelines were followed. The patient was positioned over the fluoroscopy table. The area was prepped in the usual manner. The time-out was completed. The target area was identified using fluoroscopy. A 12-in long, straight, sterile hemostat was used with fluoroscopic guidance to locate the targets for each level blocked. Once located, the skin was marked with an approved surgical skin marker. Once all sites were marked, the skin (epidermis, dermis, and hypodermis), as well as deeper tissues (fat, connective tissue and muscle) were infiltrated with a small amount of a short-acting local anesthetic, loaded on a 10cc syringe with a 25G, 1.5-in  Needle. An  appropriate amount of time was allowed for local anesthetics to take effect before proceeding to the next step. Technical description of process:   Radiofrequency Ablation (RFA) L3 Medial Branch Nerve RFA: The target area for the L3 medial branch is at the junction of the postero-lateral aspect of the superior articular process and the superior, posterior, and medial edge of the transverse process of L4. Under fluoroscopic guidance, a Radiofrequency needle was inserted until contact was made with os over the superior postero-lateral aspect of the pedicular shadow (target area). Sensory and motor testing was conducted to properly adjust the position of the needle. Once satisfactory placement of the needle was achieved, the numbing solution was slowly injected after negative aspiration for blood. 2.0 mL of the nerve block solution was injected without difficulty or complication. After waiting for at least 3 minutes, the ablation was performed. Once completed, the needle was removed intact. L4 Medial Branch Nerve RFA: The target area for the L4 medial branch is at the junction of the postero-lateral aspect of the superior articular process and the superior, posterior, and medial edge of the transverse process of L5. Under fluoroscopic guidance, a Radiofrequency needle was inserted until contact was made with os over the superior postero-lateral aspect of the pedicular shadow (target area). Sensory and motor testing was conducted to properly adjust the position of the needle. Once satisfactory placement of the needle was achieved, the numbing solution was slowly injected after negative aspiration for blood. 2.0 mL of the nerve block solution was injected without difficulty or complication. After waiting for at least 3 minutes, the ablation was performed. Once completed, the needle was removed intact. L5 Medial Branch Nerve RFA: The target area for the L5 medial branch is at the junction of the postero-lateral  aspect of the superior articular process of S1 and the superior, posterior, and medial edge of the sacral ala. Under fluoroscopic guidance, a Radiofrequency needle was inserted until contact was made with os over the superior postero-lateral aspect of the pedicular shadow (target area). Sensory and motor testing was conducted to properly adjust the position of the needle. Once satisfactory placement of the needle was achieved, the numbing solution was slowly injected after negative aspiration  for blood. 2.0 mL of the nerve block solution was injected without difficulty or complication. After waiting for at least 3 minutes, the ablation was performed. Once completed, the needle was removed intact  Radiofrequency lesioning (ablation):  Radiofrequency Generator: Medtronic AccurianTM AG 1000 RF Generator Sensory Stimulation Parameters: 50 Hz was used to locate & identify the nerve, making sure that the needle was positioned such that there was no sensory stimulation below 0.3 V or above 0.7 V. Motor Stimulation Parameters: 2 Hz was used to evaluate the motor component. Care was taken not to lesion any nerves that demonstrated motor stimulation of the lower extremities at an output of less than 2.5 times that of the sensory threshold, or a maximum of 2.0 V. Lesioning Technique Parameters: Standard Radiofrequency settings. (Not bipolar or pulsed.) Temperature Settings: 80 degrees C Lesioning time: 60 seconds Intra-operative Compliance: Compliant  6 cc solution made of 5 cc of 0.2% ropivacaine, 1 cc of Decadron 10 mg/cc.  2 cc injected at each level above on the left after sensorimotor testing, prior to lesioning.  Once the entire procedure was completed, the treated area was cleaned, making sure to leave some of the prepping solution back to take advantage of its long term bactericidal properties.    Illustration of the posterior view of the lumbar spine and the posterior neural structures. Laminae of L2  through S1 are labeled. DPRL5, dorsal primary ramus of L5; DPRS1, dorsal primary ramus of S1; DPR3, dorsal primary ramus of L3; FJ, facet (zygapophyseal) joint L3-L4; I, inferior articular process of L4; LB1, lateral branch of dorsal primary ramus of L1; IAB, inferior articular branches from L3 medial branch (supplies L4-L5 facet joint); IBP, intermediate branch plexus; MB3, medial branch of dorsal primary ramus of L3; NR3, third lumbar nerve root; S, superior articular process of L5; SAB, superior articular branches from L4 (supplies L4-5 facet joint also); TP3, transverse process of L3.  Vitals:   12/07/21 1028 12/07/21 1032 12/07/21 1036 12/07/21 1042  BP: (!) 123/94 129/89 (!) 135/104 (!) 147/108  Pulse:      Resp: '16 18 15   '$ Temp:      TempSrc:      SpO2: 96% 95% 95%   Weight:      Height:       Start Time: 1023 hrs. End Time: 1036 hrs.  Imaging Guidance (Spinal):          Type of Imaging Technique: Fluoroscopy Guidance (Spinal) Indication(s): Assistance in needle guidance and placement for procedures requiring needle placement in or near specific anatomical locations not easily accessible without such assistance. Exposure Time: Please see nurses notes. Contrast: None used. Fluoroscopic Guidance: I was personally present during the use of fluoroscopy. "Tunnel Vision Technique" used to obtain the best possible view of the target area. Parallax error corrected before commencing the procedure. "Direction-depth-direction" technique used to introduce the needle under continuous pulsed fluoroscopy. Once target was reached, antero-posterior, oblique, and lateral fluoroscopic projection used confirm needle placement in all planes. Images permanently stored in EMR. Interpretation: No contrast injected. I personally interpreted the imaging intraoperatively. Adequate needle placement confirmed in multiple planes. Permanent images saved into the patient's record.  Post-operative Assessment:   Post-procedure Vital Signs:  Pulse/HCG Rate:  7674 Temp:  (!) 97 F (36.1 C) Resp: 15 BP: (!) 147/108 (BP reported tp MD) SpO2: 95 %  EBL: None  Complications: No immediate post-treatment complications observed by team, or reported by patient.  Note: The patient tolerated the entire procedure  well. A repeat set of vitals were taken after the procedure and the patient was kept under observation following institutional policy, for this type of procedure. Post-procedural neurological assessment was performed, showing return to baseline, prior to discharge. The patient was provided with post-procedure discharge instructions, including a section on how to identify potential problems. Should any problems arise concerning this procedure, the patient was given instructions to immediately contact us, at any time, without hesitation. In any case, we plan to contact the patient by telephone for a follow-up status report regarding this interventional procedure.  Comments:  No additional relevant information.  Plan of Care  Okay to restart Xarelto tomorrow.  Orders:  Orders Placed This Encounter  Procedures   DG PAIN [facility] C-ARM 1-60 MIN NO REPORT    Intraoperative interpretation by procedural physician at [place].    Standing Status:   Standing    Number of Occurrences:   1    Order Specific Question:   Reason for exam:    Answer:   Assistance in needle guidance and placement for procedures requiring needle placement in or near specific anatomical locations not easily accessible without such assistance.   Chronic Opioid Analgesic:  Tramadol 50 mg 4 times daily as needed, quantity 120/month; MME equals 20    Medications ordered for procedure: Meds ordered this encounter  Medications   lidocaine (XYLOCAINE) 2 % (with pres) injection 400 mg   diazepam (VALIUM) tablet 10 mg    Make sure Flumazenil is available in the pyxis when using this medication. If oversedation occurs,  administer 0.2 mg IV over 15 sec. If after 45 sec no response, administer 0.2 mg again over 1 min; may repeat at 1 min intervals; not to exceed 4 doses (1 mg)   dexamethasone (DECADRON) injection 10 mg   ropivacaine (PF) 2 mg/mL (0.2%) (NAROPIN) injection 9 mL   Medications administered: We administered lidocaine, diazepam, dexamethasone, and ropivacaine (PF) 2 mg/mL (0.2%).  See the medical record for exact dosing, route, and time of administration.  Follow-up plan:   Return for Keep sch. appt, Contra-lateral RFA.      Recent Visits Date Type Provider Dept  11/15/21 Office Visit Gillis Santa, MD [facility]  10/12/21 Procedure visit Gillis Santa, MD [facility]  Showing recent visits within past 90 days and meeting all other requirements Today's Visits Date Type Provider Dept  12/07/21 Procedure visit Gillis Santa, MD [facility]  Showing today's visits and meeting all other requirements Future Appointments Date Type Provider Dept  01/02/22 Appointment Gillis Santa, MD [facility]  02/07/22 Appointment Gillis Santa, MD [facility]  Showing future appointments within next 90 days and meeting all other requirements  Disposition: Discharge home  Discharge (Date  Time): 12/07/2021; 1043 hrs.   Primary Care Physician: Olin Hauser, DO Location: [facility] Note by: Gillis Santa, MD Date: 12/07/2021; Time: 10:49 AM  Disclaimer:  Medicine is not an exact science. The only guarantee in medicine is that nothing is guaranteed. It is important to note that the decision to proceed with this intervention was based on the information collected from the patient. The Data and conclusions were drawn from the patient's questionnaire, the interview, and the physical examination. Because the information was provided in large part by the patient, it cannot be guaranteed that it has not been purposely or  unconsciously manipulated. Every effort has been made to obtain as much relevant data as possible for  this evaluation. It is important to note that the conclusions that lead to this procedure are derived in large part from the available data. Always take into account that the treatment will also be dependent on availability of resources and existing treatment guidelines, considered by other Pain Management Practitioners as being common knowledge and practice, at the time of the intervention. For Medico-Legal purposes, it is also important to point out that variation in procedural techniques and pharmacological choices are the acceptable norm. The indications, contraindications, technique, and results of the above procedure should only be interpreted and judged by a Board-Certified Interventional Pain Specialist with extensive familiarity and expertise in the same exact procedure and technique.

## 2021-12-20 ENCOUNTER — Other Ambulatory Visit: Payer: Self-pay

## 2021-12-20 ENCOUNTER — Encounter
Admission: RE | Admit: 2021-12-20 | Discharge: 2021-12-20 | Disposition: A | Discharge status: Home / Self Care | Payer: Medicare Other | Source: Ambulatory Visit | Attending: Orthopedic Surgery | Admitting: Orthopedic Surgery

## 2021-12-20 HISTORY — DX: Anxiety disorder, unspecified: F41.9

## 2021-12-20 NOTE — Patient Instructions (Addendum)
Your procedure is scheduled on: 12/27/21 - Tuesday Report to the Registration Desk on the 1st floor of the [facility]. To find out your arrival time, please call [phone] between 1PM - 3PM on: 12/26/21 - Monday If your arrival time is 6:00 am, do not arrive prior to that time as the [facility] entrance doors do not open until 6:00 am.  REMEMBER: Instructions that are not followed completely may result in serious medical risk, up to and including death; or upon the discretion of your surgeon and anesthesiologist your surgery may need to be rescheduled.  Do not eat food after midnight the night before surgery.  No gum chewing, lozengers or hard candies.  You may however, drink CLEAR liquids up to 2 hours before you are scheduled to arrive for your surgery. Do not drink anything within 2 hours of your scheduled arrival time.  Clear liquids include: - water  - apple juice without pulp - gatorade (not RED colors) - black coffee or tea (Do NOT add milk or creamers to the coffee or tea) Do NOT drink anything that is not on this list.  TAKE THESE MEDICATIONS THE MORNING OF SURGERY WITH A SIP OF WATER:  - amiodarone (PACERONE)  - DULoxetine (CYMBALTA)  - metoprolol succinate (TOPROL-XL)  - busPIRone (BUSPAR)  - HYDROcodone-acetaminophen (NORCO)   HOLD XARELTO beginning 12/24/21  PER patient he is holding his Aspirin 325 mg for 5 days prior to surgery.  One week prior to surgery: Stop Anti-inflammatories (NSAIDS) such as Advil, Aleve, Ibuprofen, Motrin, Naproxen, Naprosyn and Aspirin based products such as Excedrin, Goodys Powder, BC Powder.  Stop ANY OVER THE COUNTER supplements until after surgery.  You may however, continue to take Tylenol if needed for pain up until the day of surgery.  No Alcohol for 24 hours before or after surgery.  No Smoking including e-cigarettes for 24 hours prior to surgery.  No chewable tobacco products for at least 6 hours prior to surgery.   No nicotine patches on the day of surgery.  Do not use any "recreational" drugs for at least a week prior to your surgery.  Please be advised that the combination of cocaine and anesthesia may have negative outcomes, up to and including death. If you test positive for cocaine, your surgery will be cancelled.  On the morning of surgery brush your teeth with toothpaste and water, you may rinse your mouth with mouthwash if you wish. Do not swallow any toothpaste or mouthwash.  Use CHG Soap or wipes as directed on instruction sheet.  Do not wear jewelry, make-up, hairpins, clips or nail polish.  Do not wear lotions, powders, or perfumes.   Do not shave body from the neck down 48 hours prior to surgery just in case you cut yourself which could leave a site for infection.  Also, freshly shaved skin may become irritated if using the CHG soap.  Contact lenses, hearing aids and dentures may not be worn into surgery.  Do not bring valuables to the hospital. [facility] is not responsible for any missing/lost belongings or valuables.   Notify your doctor if there is any change in your medical condition (cold, fever, infection).  Wear comfortable clothing (specific to your surgery type) to the hospital.  After surgery, you can help prevent lung complications by doing breathing exercises.  Take deep breaths and cough every 1-2 hours. Your doctor may order a device called an Incentive Spirometer to help you take deep breaths. When coughing or  sneezing, hold a pillow firmly against your incision with both hands. This is called "splinting." Doing this helps protect your incision. It also decreases belly discomfort.  If you are being admitted to the hospital overnight, leave your suitcase in the car. After surgery it may be brought to your room.  If you are being discharged the day of surgery, you will not be allowed to drive home. You will need a responsible adult (18 years or older) to drive you  home and stay with you that night.   If you are taking public transportation, you will need to have a responsible adult (18 years or older) with you. Please confirm with your physician that it is acceptable to use public transportation.   Please call the [facility] Dept. at [phone] if you have any questions about these instructions.  Surgery Visitation Policy:  Patients undergoing a surgery or procedure may have two family members or support persons with them as long as the person is not COVID-19 positive or experiencing its symptoms.   Inpatient Visitation:    Visiting hours are 7 a.m. to 8 p.m. Up to four visitors are allowed at one time in a patient room. The visitors may rotate out with other people during the day. One designated support person (adult) may remain overnight.  MASKING: Due to an increase in RSV rates and hospitalizations, in-patient care areas in which we serve newborns, infants and children, masks will be required for teammates and visitors.  Children ages 38 and under may not visit. This policy affects the following departments only:  [facility] [facility]partum area Mother Baby Unit Newborn nursery/Special care nursery  Other areas: Masks continue to be strongly recommended for [place] teammates, visitors and patients in all other areas. Visitation is not restricted outside of the units listed above.

## 2021-12-21 ENCOUNTER — Ambulatory Visit: Payer: Medicare Other | Attending: Orthopedic Surgery

## 2021-12-21 DIAGNOSIS — M25511 Pain in right shoulder: Secondary | ICD-10-CM | POA: Insufficient documentation

## 2021-12-21 DIAGNOSIS — M25512 Pain in left shoulder: Secondary | ICD-10-CM | POA: Insufficient documentation

## 2021-12-21 DIAGNOSIS — G8929 Other chronic pain: Secondary | ICD-10-CM | POA: Insufficient documentation

## 2021-12-21 DIAGNOSIS — M6281 Muscle weakness (generalized): Secondary | ICD-10-CM | POA: Diagnosis not present

## 2021-12-21 NOTE — Therapy (Signed)
OUTPATIENT PHYSICAL THERAPY SHOULDER TREATMENT/PROGRESS NOTE  Dates of reporting period  09/30/21   to   12/21/21   Patient Name: [name] MRN: [id number] DOB:03/29/1955, 66 y.o., male Today's Date: 12/21/2021   PT End of Session - 12/21/21 0922     Visit Number 10    Number of Visits 25    Date for PT Re-Evaluation 12/23/21    Authorization Type eval: 09/30/21    PT Start Time 0930    PT Stop Time 1015    PT Time Calculation (min) 45 min    Activity Tolerance Patient tolerated treatment well    Behavior During Therapy [facility] for tasks assessed/performed            Past Medical History:  Diagnosis Date   Anxiety    Aortic atherosclerosis ([place])    Cardiomyopathy (in setting of Afib)    a.) TTE 12/26/2013: EF 45-50%, mild ant and antsept HK. mild MR. Mod dil LA. nl RV fxn. Rhythm was Afib; b.) TTE 07/04/2019: EF 55%, mid-apical anteroseptal HK, mild MAC, mild Ao sclerosis, G2DD, RVSP 45.3   Chronic pain syndrome    a.) followed by pain management   Chronic, continuous use of opioids    a.) hydrocodone/APAP 7.5/325 mg; followed by pain management   Coronary artery disease 11/06/2014   a.) cCTA 11/06/2014: Ca score 224 (all in pLAD) -- 74th percentile for age/sex matched control   Diverticulosis    DJD (degenerative joint disease) of knee    History of hiatal hernia    History of kidney stones 2012   Hyperlipidemia    Hyperplastic colon polyp    Hypertension    Internal hemorrhoids    Intervertebral disc disorder with radiculopathy of lumbosacral region    Long term current use FULL DOSE (325 mg) aspirin    Long term current use of amiodarone    Long term current use of antithrombotics/antiplatelets    a.) rivaroxaban   Lower extremity edema    Mixed hyperlipidemia    Myalgia due to statin    Osteoarthritis    PAF (paroxysmal atrial fibrillation) ([place])    a.) CHA2DS2VASc = 4 (age, HTN, CHF, vascular disease history);  b.) s/p DCCV 03/13/2014 (200 J x1); c.) s/p DCCV  05/15/2019 (150 J x 1, 200 J x2); d.) rate/rhythm maintained on oral amiodarone + metoprolol succinate; chronically anticoagulated with rivaroxaban   Pernicious anemia    Prostate cancer ([place])    Pulmonary nodule, right    a. 10/2014 Cardiac CTA: 106m RLL nodule; b. 04/2015 CT Chest: stable 7616mRLL nodule. No new nodules; 02/2017 CTA Chest: stable, benign, 16m84mLL pulm nodule.   Sleep difficulties    a.) takes melatonin + trazodone PRN   SVT (supraventricular tachycardia)    Tubular adenoma of colon    Past Surgical History:  Procedure Laterality Date   BICEPT TENODESIS Right 09/27/2021   Procedure: Right reverse shoulder arthroplasty, biceps tenodesis;  Surgeon: PatLeim FabryD;  Location: [facility] ORS;  Service: Orthopedics;  Laterality: Right;   CARDIOVERSION N/A 05/15/2019   Procedure: CARDIOVERSION;  Surgeon: GolMinna MerrittsD;  Location: [facility] ORS;  Service: Cardiovascular;  Laterality: N/A;   CARDIOVERSION N/A 03/13/2014   Procedure: CARDIOVERSION; [facility] TimIda RogueD   CARPAL TUNNEL RELEASE Right 12/23/2014   Procedure: CARPAL TUNNEL RELEASE;  Surgeon: HowEarnestine LeysD;  Location: [facility] ORS;  Service: Orthopedics;  Laterality: Right;   CARPAL TUNNEL RELEASE Left 01/06/2015   Procedure: CARPAL  TUNNEL RELEASE;  Surgeon: Earnestine Leys, MD;  Location: [facility] ORS;  Service: Orthopedics;  Laterality: Left;   COLONOSCOPY WITH PROPOFOL N/A 10/08/2017   Procedure: COLONOSCOPY WITH PROPOFOL;  Surgeon: Lin Landsman, MD;  Location: [facility]OSCOPY;  Service: Gastroenterology;  Laterality: N/A;   GASTRIC BYPASS  01/17/2000   KNEE SURGERY Right    knee trauma x3   prostate seeding     REVERSE SHOULDER ARTHROPLASTY Right 09/27/2021   Procedure: Right reverse shoulder arthroplasty, biceps tenodesis;  Surgeon: Leim Fabry, MD;  Location: [facility] ORS;  Service: Orthopedics;  Laterality: Right;   Patient Active Problem List   Diagnosis Date Noted   S/p reverse total  shoulder arthroplasty 09/27/2021   Lesion of bone of lumbosacral spine (L5) 05/12/2021   Localized osteoarthritis of shoulder regions, bilateral 03/29/2021   Chronic pain of both shoulders 03/29/2021   Drug-induced myopathy 02/21/2021   Trigger finger of right hand 11/15/2020   Prostate cancer ([place]) 07/26/2020   Insomnia 08/01/2019   Primary osteoarthritis of both wrists 02/17/2019   Trigger middle finger of left hand 02/17/2019   Chronic pain syndrome 02/17/2019   Chronic radicular lumbar pain 10/08/2018   Lumbar radiculopathy 10/08/2018   Lumbar degenerative disc disease 10/08/2018   Lumbar facet arthropathy 10/08/2018   Lumbar facet joint syndrome 10/08/2018   Intervertebral disc disorder with radiculopathy of lumbosacral region    Pernicious anemia 05/22/2016   Paroxysmal atrial fibrillation ([place]) 12/20/2015   DJD (degenerative joint disease) of knee 10/27/2014   Bilateral carpal tunnel syndrome 10/27/2014   Morbid obesity with BMI of 50.0-59.9, adult ([place]) 10/27/2014   Mixed hyperlipidemia 10/27/2014   H/O gastric bypass 03/20/2014   Hyperkalemia 03/20/2014   History of prostate cancer 12/26/2013   Essential hypertension 12/26/2013   Encounter for anticoagulation discussion and counseling 12/26/2013   PCP: Olin Hauser, DO  REFERRING PROVIDER: Leim Fabry, MD  REFERRING DIAGNOSIS: M75.121 (ICD-10-CM) - Complete rotator cuff tear or rupture of right shoulder, not specified as traumatic  THERAPY DIAG: Chronic right shoulder pain  Muscle weakness (generalized)  RATIONALE FOR EVALUATION AND TREATMENT: Rehabilitation  ONSET DATE: 09/27/21  FOLLOW UP APPT WITH PROVIDER: Yes, 10/10/21 at 10:15 with Dr. Posey Pronto   FROM INITIAL EVALUATION (09/30/21) SUBJECTIVE:                                                                                                                                                                                         Chief Complaint: Right  reverse TSR and right biceps tenodesis on 09/27/21  Pertinent History Pt has a history of chronic R shoulder pain and failed conservative management. Imaging was consistent  with massive, irreparable rotator cuff tear. He underwent right reverse TSR and right biceps tenodesis on 09/27/21. Operative findings include massive rotator cuff tear (complete supraspinatus, partial infraspinatus, complete subscapularis). No reported post-op complications with the exception of trying to coordinate proper medication management. He was advised by MD that he is allowed to remove his sling to perform pendulums and to bathe. He has been able to perform pendulums since hospital discharge. Pt reports being consistent with ice since discharge. He has been sleeping in chairs as well as in his bed since discharge. He reports using proper support for his RUE. He has a history of chronic back pain which has been worse since admission to the hospital. Pt is wanted to be able to have his L shoulder surgery by the end of this year.   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 Passive RoM allowed to 90 FF and 30 ER. General reverse TSR precautions include no combined shoulder extension, IR, and adduction.  No shoulder and elbow AROM (bicep tenodesis) for at least the first 6 weeks to be further assessed once protocol can be obtained;  Weight Bearing Restrictions: Yes no WB through [place] with: lives with their spouse, granddaughter over frequently (45 years old); Lives in: House/apartment  Patient Goals: Improve R shoulder function, return to working in the yard;  OBJECTIVE:   Patient Surveys  FOTO: 20, predicted improvement to 78 QuickDASH: Deferred  Cognition Patient is oriented to person, place, and time.  Recent memory is intact.  Remote memory is intact.  Attention span and concentration are intact.  Expressive speech is intact.  Patient's fund of knowledge is within normal limits for educational level.    Gross Musculoskeletal Assessment Tremor: None Bulk: Normal Tone: Normal Incision is dry and clean without excessive warmth, redness, or swelling noted around R shoulder;  Gait Deferred  Posture Forward head and rounded shoulders;  Cervical Screen AROM: WFL and painless with overpressure in all planes Spurlings A (ipsilateral lateral flexion/axial compression): R: Not examined L: Not examined Spurlings B (ipsilateral lateral flexion/contralateral rotation/axial compression): R: Not examined L: Not examined Repeated movement: No centralization or peripheralization with protraction or retraction Hoffman Sign (cervical cord compression): R: Not examined L: Not examined ULTT Median: R: Not examined L: Not examined ULTT Ulnar: R: Not examined L: Not examined ULTT Radial: R: Not examined L: Not examined  AROM  AROM (Normal range in degrees) AROM 12/21/2021  Cervical  Flexion (50) WFL  Extension (80) WFL  Right lateral flexion (45) WFL  Left lateral flexion (45) WFL  Right rotation (85) WFL  Left rotation (85) WFL   Right Left  Shoulder    Flexion Approx 60 (PROM)*   Extension    Abduction Approx 70 (PROM)*   External Rotation 0 (PROM)*    Internal Rotation Palm to stomach at neutral shoulder  Hands Behind Head    Hands Behind Back        Elbow    Flexion Full (PROM)   Extension 0 (PROM)   Pronation Full (PROM)   Supination Full (PROM)   (* = pain; Blank rows = not tested)  UE MMT: MMT (out of 5) Right 10/01/2021 Left 10/01/2021  Cervical (isometric)  Flexion   Extension   Lateral Flexion    Rotation        Shoulder   Flexion    Extension    Abduction    External rotation    Internal rotation    Horizontal abduction    Horizontal adduction    Lower Trapezius    Rhomboids        Elbow  Flexion    Extension    Pronation    Supination        Wrist  Flexion 5 5  Extension 5 5  Radial deviation 5 5  Ulnar deviation 5 5      MCP  Flexion 5 5  Extension 5 5  Abduction 5 5  Adduction 5 5  (* = pain; Blank rows = not tested)  Grip: Strong and roughly symmetrical BUE  Sensation Grossly intact to light touch bilateral UE as determined by testing dermatomes C2-T2. Proprioception and hot/cold testing deferred on this date.  Reflexes Deferred  Palpation Location LEFT  RIGHT           Subocciptials    Cervical paraspinals    Upper Trapezius    Levator Scapulae    Rhomboid Major/Minor    Sternoclavicular joint    Acromioclavicular joint    Coracoid process    Long head of biceps    Supraspinatus    Infraspinatus    Subscapularis    Teres Minor    Teres Major    Pectoralis Major    Pectoralis Minor    Anterior Deltoid  1  Lateral Deltoid  0  Posterior Deltoid  0  Latissimus Dorsi    Sternocleidomastoid    (Blank rows = not tested) Graded on 0-4 scale (0 = no pain, 1 = pain, 2 = pain with wincing/grimacing/flinching, 3 = pain with withdrawal, 4 = unwilling to allow palpation), (Blank rows = not tested)  Repeated Movements Deferred  Passive Accessory Intervertebral Motion Deferred  Accessory Motions/Glides Muscle Length Testing: Deferred  Special Tests: Deferred  [place]  scale: Deferred   TODAY'S TREATMENT    SUBJECTIVE: Pt reports that he is doing alright today. He denies any episodes of R shoulder subluxation/dislocation since the last therapy session. He continues to have full R shoulder AROM flexion and only has minimal pain end range. He had his pre-op testing yesterday to plan for his L shoulder surgery next week (12/27/21). No specific questions or concerns currently.   PAIN: Occasional R shoulder soreness   Ther-ex  QuickDASH: 47.7% All exercises performed in semirecumbent position: R shoulder flexion from neutral to 90 degrees with 2# dumbbell 2 x 10; R shoulder serratus punch with 2# dumbbell (DB) 2 x 10; R shoulder circles with 2# DB CW/CCW 2 x 10 each direction; R shoulder rhythmic stabilization with resistance at wrist 2 x 30s; R elbow flexion with 2# DB 2 x 10; R elbow extension with manual resistance 2 x 10;  All exercises performed in L sidelying position: R shoulder ER with 2# dumbbell 2 x 10; R shoulder abduction from neutral to 90 with 2# dumbbell 2 x 10; R scapular  retraction with manual resistance 2 x 10;  Seated PVC chest press with manual resistance 2 x 10; Seated PVC rows with manual resistance 2 x 10; Standing R shoulder wall circles with small yellow swiss ball CW/CCW 2 x 60s each;   Manual Therapy  R shoulder PROM into flexion and abduction x multiple bouts each direction never pushing through pain or resistance;   PATIENT EDUCATION:  Education details: Pt educated throughout session about proper posture and technique with exercises. Improved exercise technique, movement at target joints, use of target muscles after min to mod verbal, visual, tactile cues.  Person educated: Patient Education method: Explanation, verbal cues, tactile cues Education comprehension: verbalized understanding and returned demonstration   HOME EXERCISE PROGRAM: Access Code: [email or web address] URL: [email or web address] Date:  11/25/2021 Prepared by: Roxana Hires  Exercises - Seated Cervical Sidebending Stretch  - 2 x daily - 7 x weekly - 3 reps - 30s hold - Seated Scapular Retraction  - 2 x daily - 7 x weekly - 2 sets - 10 reps - 3s hold - Seated Shoulder Row with Anchored Resistance  - 2 x daily - 7 x weekly - 2 sets - 10 reps - Seated Bicep Curls Supinated with Dumbbells  - 2 x daily - 7 x weekly - 2 sets - 10 reps   ASSESSMENT:  CLINICAL IMPRESSION: Pt continues to demonstrate full R shoulder AROM into flexion, abduction, and external rotation. Deferred updating goals until next session at which time pt will need a recertification. Continued light strengthening below 90 degrees with patient during visit today and utilized 2# dumbbells again. He is able to use the dumbbells without any increase in pain. Included ball rolls on the wall at shoulder height to work on R shoulder endurance. Pt is able to complete all exercises with appropriate muscle fatigue. No HEP updates at this time. Pt encouraged to follow-up as scheduled. He will benefit from skilled PT to address above impairments and improve overall function.  REHAB POTENTIAL: Good  CLINICAL DECISION MAKING: Unstable/unpredictable  EVALUATION COMPLEXITY: High   GOALS: Goals reviewed with patient? Yes  SHORT TERM GOALS: Target date: 01/18/2022  Pt will be independent with HEP to improve strength and decrease shoulder pain to improve pain-free function at home and when working in the yard Baseline:  Goal status: ONGOING   LONG TERM GOALS: Target date: 02/15/2022  Pt will increase FOTO to at least 54 to demonstrate significant improvement in function at home and work related to neck pain  Baseline: 09/30/21: 20 Goal status: DEFERRED  2.  Pt will decrease worst shoulder pain by at least 3 points on the NPRS in order to demonstrate clinically significant reduction in shoulder pain. Baseline: 09/30/21: worst 10/10 Goal status: DEFERRED  3.  Pt will  decrease quick DASH score by at least 8% in order to demonstrate clinically significant reduction in disability related to shoulder pain        Baseline: 09/30/21: To be completed; 12/21/21: 47.7% Goal status: ONGOING  4. Pt will increase strength of R shoulder flexion and abduction to at least 4/5 in order to improve R shoulder strength and function so he can complete his household responsibilities and work in the yard      Baseline: 09/30/21: Unable to test; Goal status: DEFERRED  5. Pt will increase R shoulder AROM flexion and abduction to at least 120 each in to improve R shoulder function so he can complete his household responsibilities and work in the yard  Baseline: 09/30/21: Unable to test; 11/25/21: R shoulder flexion and abduction are both at least 180 degrees Goal status: ACHIEVED   PLAN: PT FREQUENCY: 1-2x/week  PT DURATION: 8 weeks  PLANNED INTERVENTIONS: Therapeutic exercises, Therapeutic activity, Neuromuscular re-education, Balance training, Gait training, Patient/Family education, Joint manipulation, Joint mobilization, Vestibular training, Canalith repositioning, Aquatic Therapy, Dry Needling, Electrical stimulation, Spinal manipulation, Spinal mobilization, Cryotherapy, Moist heat, Traction, Ultrasound, Ionotophoresis 11m/ml Dexamethasone, and Manual therapy  PLAN FOR NEXT SESSION: update outcome measures/goals, review/modify HEP as necessary, progress strength/ROM per protocol;  JLyndel SafeHuprich PT, DPT, GCS  [place] 12/21/2021, 9:28 PM

## 2021-12-23 ENCOUNTER — Ambulatory Visit: Payer: Medicare Other

## 2021-12-23 NOTE — Therapy (Incomplete)
OUTPATIENT PHYSICAL THERAPY SHOULDER TREATMENT  Patient Name: [name] MRN: [id number] DOB:1955-09-03, 66 y.o., male Today's Date: 12/23/2021    Past Medical History:  Diagnosis Date   Anxiety    Aortic atherosclerosis ([place])    Cardiomyopathy (in setting of Afib)    a.) TTE 12/26/2013: EF 45-50%, mild ant and antsept HK. mild MR. Mod dil LA. nl RV fxn. Rhythm was Afib; b.) TTE 07/04/2019: EF 55%, mid-apical anteroseptal HK, mild MAC, mild Ao sclerosis, G2DD, RVSP 45.3   Chronic pain syndrome    a.) followed by pain management   Chronic, continuous use of opioids    a.) hydrocodone/APAP 7.5/325 mg; followed by pain management   Coronary artery disease 11/06/2014   a.) cCTA 11/06/2014: Ca score 224 (all in pLAD) -- 74th percentile for age/sex matched control   Diverticulosis    DJD (degenerative joint disease) of knee    History of hiatal hernia    History of kidney stones 2012   Hyperlipidemia    Hyperplastic colon polyp    Hypertension    Internal hemorrhoids    Intervertebral disc disorder with radiculopathy of lumbosacral region    Long term current use FULL DOSE (325 mg) aspirin    Long term current use of amiodarone    Long term current use of antithrombotics/antiplatelets    a.) rivaroxaban   Lower extremity edema    Mixed hyperlipidemia    Myalgia due to statin    Osteoarthritis    PAF (paroxysmal atrial fibrillation) ([place])    a.) CHA2DS2VASc = 4 (age, HTN, CHF, vascular disease history);  b.) s/p DCCV 03/13/2014 (200 J x1); c.) s/p DCCV 05/15/2019 (150 J x 1, 200 J x2); d.) rate/rhythm maintained on oral amiodarone + metoprolol succinate; chronically anticoagulated with rivaroxaban   Pernicious anemia    Prostate cancer ([place])    Pulmonary nodule, right    a. 10/2014 Cardiac CTA: 26m RLL nodule; b. 04/2015 CT Chest: stable 756mRLL nodule. No new nodules; 02/2017 CTA Chest: stable, benign, 76m36mLL pulm nodule.   Sleep difficulties    a.) takes melatonin + trazodone  PRN   SVT (supraventricular tachycardia)    Tubular adenoma of colon    Past Surgical History:  Procedure Laterality Date   BICEPT TENODESIS Right 09/27/2021   Procedure: Right reverse shoulder arthroplasty, biceps tenodesis;  Surgeon: PatLeim FabryD;  Location: [facility];  Service: Orthopedics;  Laterality: Right;   CARDIOVERSION N/A 05/15/2019   Procedure: CARDIOVERSION;  Surgeon: GolMinna MerrittsD;  Location: [facility];  Service: Cardiovascular;  Laterality: N/A;   CARDIOVERSION N/A 03/13/2014   Procedure: CARDIOVERSION; [facility] TimIda RogueD   CARPAL TUNNEL RELEASE Right 12/23/2014   Procedure: CARPAL TUNNEL RELEASE;  Surgeon: HowEarnestine LeysD;  Location: [facility];  Service: Orthopedics;  Laterality: Right;   CARPAL TUNNEL RELEASE Left 01/06/2015   Procedure: CARPAL TUNNEL RELEASE;  Surgeon: HowEarnestine LeysD;  Location: [facility];  Service: Orthopedics;  Laterality: Left;   COLONOSCOPY WITH PROPOFOL N/A 10/08/2017   Procedure: COLONOSCOPY WITH PROPOFOL;  Surgeon: VanLin LandsmanD;  [facility];  Service: Gastroenterology;  Laterality: N/A;   GASTRIC BYPASS  01/17/2000   KNEE SURGERY Right    knee trauma x3   prostate seeding     REVERSE SHOULDER ARTHROPLASTY Right 09/27/2021   Procedure: Right reverse shoulder arthroplasty, biceps tenodesis;  Surgeon: PatLeim FabryD;  Location: [facility];  Service: Orthopedics;  Laterality: Right;   Patient  Active Problem List   Diagnosis Date Noted   S/p reverse total shoulder arthroplasty 09/27/2021   Lesion of bone of lumbosacral spine (L5) 05/12/2021   Localized osteoarthritis of shoulder regions, bilateral 03/29/2021   Chronic pain of both shoulders 03/29/2021   Drug-induced myopathy 02/21/2021   Trigger finger of right hand 11/15/2020   Prostate cancer ([place]) 07/26/2020   Insomnia 08/01/2019   Primary osteoarthritis of both wrists 02/17/2019   Trigger middle finger of left hand 02/17/2019    Chronic pain syndrome 02/17/2019   Chronic radicular lumbar pain 10/08/2018   Lumbar radiculopathy 10/08/2018   Lumbar degenerative disc disease 10/08/2018   Lumbar facet arthropathy 10/08/2018   Lumbar facet joint syndrome 10/08/2018   Intervertebral disc disorder with radiculopathy of lumbosacral region    Pernicious anemia 05/22/2016   Paroxysmal atrial fibrillation ([place]) 12/20/2015   DJD (degenerative joint disease) of knee 10/27/2014   Bilateral carpal tunnel syndrome 10/27/2014   Morbid obesity with BMI of 50.0-59.9, adult ([place]) 10/27/2014   Mixed hyperlipidemia 10/27/2014   H/O gastric bypass 03/20/2014   Hyperkalemia 03/20/2014   History of prostate cancer 12/26/2013   Essential hypertension 12/26/2013   Encounter for anticoagulation discussion and counseling 12/26/2013   PCP: Olin Hauser, DO  REFERRING PROVIDER: Leim Fabry, MD  REFERRING DIAGNOSIS: M75.121 (ICD-10-CM) - Complete rotator cuff tear or rupture of right shoulder, not specified as traumatic  THERAPY DIAG: Chronic right shoulder pain  Muscle weakness (generalized)  RATIONALE FOR EVALUATION AND TREATMENT: Rehabilitation  ONSET DATE: 09/27/21  FOLLOW UP APPT WITH PROVIDER: Yes, 10/10/21 at 10:15 with Dr. Posey Pronto   FROM INITIAL EVALUATION (09/30/21) SUBJECTIVE:                                                                                                                                                                                         Chief Complaint: Right reverse TSR and right biceps tenodesis on 09/27/21  Pertinent History Pt has a history of chronic R shoulder pain and failed conservative management. Imaging was consistent with massive, irreparable rotator cuff tear. He underwent right reverse TSR and right biceps tenodesis on 09/27/21. Operative findings include massive rotator cuff tear (complete supraspinatus, partial infraspinatus, complete subscapularis). No reported post-op  complications with the exception of trying to coordinate proper medication management. He was advised by MD that he is allowed to remove his sling to perform pendulums and to bathe. He has been able to perform pendulums since hospital discharge. Pt reports being consistent with ice since discharge. He has been sleeping in chairs as well as in his bed since discharge. He reports using proper support for his RUE.  He has a history of chronic back pain which has been worse since admission to the hospital. Pt is wanted to be able to have his L shoulder surgery by the end of this year.   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 Passive RoM allowed to 90 FF and 30 ER. General reverse TSR precautions include no combined shoulder extension, IR, and adduction. No shoulder and elbow AROM (bicep tenodesis) for at least the first 6 weeks to be further assessed once protocol can be obtained;  Weight Bearing Restrictions: Yes no WB through [place] with: lives with their spouse, granddaughter over frequently (71 years old); Lives in: House/apartment  Patient Goals: Improve R shoulder function, return to working in the yard;  OBJECTIVE:   Patient Surveys  FOTO:  20, predicted improvement to 32 QuickDASH: Deferred  Cognition Patient is oriented to person, place, and time.  Recent memory is intact.  Remote memory is intact.  Attention span and concentration are intact.  Expressive speech is intact.  Patient's fund of knowledge is within normal limits for educational level.    Gross Musculoskeletal Assessment Tremor: None Bulk: Normal Tone: Normal Incision is dry and clean without excessive warmth, redness, or swelling noted around R shoulder;  Gait Deferred  Posture Forward head and rounded shoulders;  Cervical Screen AROM: WFL and painless with overpressure in all planes Spurlings A (ipsilateral lateral flexion/axial compression): R: Not examined L: Not examined Spurlings B (ipsilateral lateral flexion/contralateral rotation/axial compression): R: Not examined L: Not examined Repeated movement: No centralization or peripheralization with protraction or retraction Hoffman Sign (cervical cord compression): R: Not examined L: Not examined ULTT Median: R: Not examined L: Not examined ULTT Ulnar: R: Not examined L: Not examined ULTT Radial: R: Not examined L: Not examined  AROM  AROM (Normal range in degrees) AROM 12/23/2021  Cervical  Flexion (50) WFL  Extension (80) WFL  Right lateral flexion (45) WFL  Left lateral flexion (45) WFL  Right rotation (85) WFL  Left rotation (85) WFL   Right Left  Shoulder    Flexion Approx 60 (PROM)*   Extension    Abduction Approx 70 (PROM)*   External Rotation 0 (PROM)*   Internal Rotation Palm to stomach at neutral shoulder   Hands Behind Head    Hands Behind Back        Elbow    Flexion Full (PROM)   Extension 0 (PROM)   Pronation Full (PROM)   Supination Full (PROM)   (* = pain; Blank rows = not tested)  UE MMT: MMT (out of 5) Right 10/01/2021 Left 10/01/2021  Cervical (isometric)  Flexion   Extension   Lateral Flexion    Rotation        Shoulder   Flexion    Extension     Abduction    External rotation    Internal  rotation    Horizontal abduction    Horizontal adduction    Lower Trapezius    Rhomboids        Elbow  Flexion    Extension    Pronation    Supination        Wrist  Flexion 5 5  Extension 5 5  Radial deviation 5 5  Ulnar deviation 5 5      MCP  Flexion 5 5  Extension 5 5  Abduction 5 5  Adduction 5 5  (* = pain; Blank rows = not tested)  Grip: Strong and roughly symmetrical BUE  Sensation Grossly intact to light touch bilateral UE as determined by testing dermatomes C2-T2. Proprioception and hot/cold testing deferred on this date.  Reflexes Deferred  Palpation Location LEFT  RIGHT           Subocciptials    Cervical paraspinals    Upper Trapezius    Levator Scapulae    Rhomboid Major/Minor    Sternoclavicular joint    Acromioclavicular joint    Coracoid process    Long head of biceps    Supraspinatus    Infraspinatus    Subscapularis    Teres Minor    Teres Major    Pectoralis Major    Pectoralis Minor    Anterior Deltoid  1  Lateral Deltoid  0  Posterior Deltoid  0  Latissimus Dorsi    Sternocleidomastoid    (Blank rows = not tested) Graded on 0-4 scale (0 = no pain, 1 = pain, 2 = pain with wincing/grimacing/flinching, 3 = pain with withdrawal, 4 = unwilling to allow palpation), (Blank rows = not tested)  Repeated Movements Deferred  Passive Accessory Intervertebral Motion Deferred  Accessory Motions/Glides Muscle Length Testing: Deferred  Special Tests: Deferred  Beighton scale: Deferred   TODAY'S TREATMENT    SUBJECTIVE: Pt reports that he is doing alright today. He denies any episodes of R shoulder subluxation/dislocation since the last therapy session. He continues to have full R shoulder AROM flexion and only has minimal pain end range. He had his pre-op testing yesterday to plan for his L shoulder surgery next week (12/27/21). No specific questions or concerns currently.   PAIN:  Occasional R shoulder soreness   Ther-ex  QuickDASH: 47.7% All exercises performed in semirecumbent position: R shoulder flexion from neutral to 90 degrees with 2# dumbbell 2 x 10; R shoulder serratus punch with 2# dumbbell (DB) 2 x 10; R shoulder circles with 2# DB CW/CCW 2 x 10 each direction; R shoulder rhythmic stabilization with resistance at wrist 2 x 30s; R elbow flexion with 2# DB 2 x 10; R elbow extension with manual resistance 2 x 10;  All exercises performed in L sidelying position: R shoulder ER with 2# dumbbell 2 x 10; R shoulder abduction from neutral to 90 with 2# dumbbell 2 x 10; R scapular retraction with manual resistance 2 x 10;  Seated PVC chest press with manual resistance 2 x 10; Seated PVC rows with manual resistance 2 x 10; Standing R shoulder wall circles with small yellow swiss ball CW/CCW 2 x 60s each;   Manual Therapy  R shoulder PROM into flexion and abduction x multiple bouts each direction never pushing through pain or resistance;   PATIENT EDUCATION:  Education details: Pt educated throughout session about proper posture and technique with exercises. Improved exercise technique, movement at target joints, use of target muscles after min to mod verbal, visual, tactile cues.  Person educated: Patient Education method: Explanation, verbal cues, tactile cues Education comprehension: verbalized understanding and returned demonstration   HOME EXERCISE PROGRAM: Access Code: [email or web address] URL: [email or web address] Date: 11/25/2021 Prepared by: Roxana Hires  Exercises - Seated Cervical Sidebending Stretch  - 2 x daily - 7 x weekly - 3 reps - 30s hold - Seated Scapular Retraction  - 2 x daily - 7 x weekly - 2 sets - 10 reps - 3s hold - Seated Shoulder Row with Anchored Resistance  - 2 x daily - 7 x weekly - 2 sets - 10 reps - Seated Bicep Curls Supinated with Dumbbells  - 2 x daily - 7 x weekly - 2 sets - 10  reps   ASSESSMENT:  CLINICAL IMPRESSION: Pt continues to demonstrate full R shoulder AROM into flexion, abduction, and external rotation. Deferred updating goals until next session at which time pt will need a recertification. Continued light strengthening below 90 degrees with patient during visit today and utilized 2# dumbbells again. He is able to use the dumbbells without any increase in pain. Included ball rolls on the wall at shoulder height to work on R shoulder endurance. Pt is able to complete all exercises with appropriate muscle fatigue. No HEP updates at this time. Pt encouraged to follow-up as scheduled. He will benefit from skilled PT to address above impairments and improve overall function.  REHAB POTENTIAL: Good  CLINICAL DECISION MAKING: Unstable/unpredictable  EVALUATION COMPLEXITY: High   GOALS: Goals reviewed with patient? Yes  SHORT TERM GOALS: Target date: 01/20/2022  Pt will be independent with HEP to improve strength and decrease shoulder pain to improve pain-free function at home and when working in the yard Baseline:  Goal status: ONGOING   LONG TERM GOALS: Target date: 02/17/2022  Pt will increase FOTO to at least 54 to demonstrate significant improvement in function at home and work related to neck pain  Baseline: 09/30/21: 20 Goal status: DEFERRED  2.  Pt will decrease worst shoulder pain by at least 3 points on the NPRS in order to demonstrate clinically significant reduction in shoulder pain. Baseline: 09/30/21: worst 10/10 Goal status: DEFERRED  3.  Pt will decrease quick DASH score by at least 8% in order to demonstrate clinically significant reduction in disability related to shoulder pain        Baseline: 09/30/21: To be completed; 12/21/21: 47.7% Goal status: ONGOING  4. Pt will increase strength of R shoulder flexion and abduction to at least 4/5 in order to improve R shoulder strength and function so he can complete his household responsibilities  and work in the yard      Baseline: 09/30/21: Unable to test; Goal status: DEFERRED  5. Pt will increase R shoulder AROM flexion and abduction to at least 120 each in to improve R shoulder function so he can complete his household responsibilities and work in the yard      Baseline: 09/30/21: Unable to test; 11/25/21: R shoulder flexion and abduction are both at least 180 degrees Goal status: ACHIEVED   PLAN: PT FREQUENCY: 1-2x/week  PT DURATION: 8 weeks  PLANNED INTERVENTIONS: Therapeutic exercises, Therapeutic activity, Neuromuscular re-education, Balance training, Gait training, Patient/Family education, Joint manipulation, Joint mobilization, Vestibular training, Canalith repositioning, Aquatic Therapy, Dry Needling, Electrical stimulation, Spinal manipulation, Spinal mobilization, Cryotherapy, Moist heat, Traction, Ultrasound, Ionotophoresis 80m/ml Dexamethasone, and Manual therapy  PLAN FOR NEXT SESSION: update outcome measures/goals, review/modify HEP as necessary, progress strength/ROM per protocol;  JLyndel SafeHuprich PT, DPT, GCS  Kizer Nobbe  12/23/2021, 7:58 AM

## 2021-12-26 ENCOUNTER — Ambulatory Visit: Payer: Medicare Other

## 2021-12-27 ENCOUNTER — Encounter
Admission: RE | Disposition: A | Discharge status: Home / Self Care | Payer: Self-pay | Source: Ambulatory Visit | Attending: Orthopedic Surgery

## 2021-12-27 ENCOUNTER — Ambulatory Visit
Admission: RE | Admit: 2021-12-27 | Discharge: 2021-12-27 | Disposition: A | Discharge status: Home / Self Care | Payer: Medicare Other | Source: Ambulatory Visit | Attending: Orthopedic Surgery | Admitting: Orthopedic Surgery

## 2021-12-27 ENCOUNTER — Ambulatory Visit: Payer: Medicare Other | Admitting: General Practice

## 2021-12-27 ENCOUNTER — Other Ambulatory Visit: Payer: Self-pay

## 2021-12-27 ENCOUNTER — Encounter: Payer: Self-pay | Admitting: Orthopedic Surgery

## 2021-12-27 ENCOUNTER — Ambulatory Visit: Payer: Medicare Other

## 2021-12-27 DIAGNOSIS — X58XXXA Exposure to other specified factors, initial encounter: Secondary | ICD-10-CM | POA: Diagnosis not present

## 2021-12-27 DIAGNOSIS — S46012A Strain of muscle(s) and tendon(s) of the rotator cuff of left shoulder, initial encounter: Secondary | ICD-10-CM | POA: Insufficient documentation

## 2021-12-27 DIAGNOSIS — I2510 Atherosclerotic heart disease of native coronary artery without angina pectoris: Secondary | ICD-10-CM | POA: Diagnosis not present

## 2021-12-27 DIAGNOSIS — G709 Myoneural disorder, unspecified: Secondary | ICD-10-CM | POA: Insufficient documentation

## 2021-12-27 DIAGNOSIS — S43432A Superior glenoid labrum lesion of left shoulder, initial encounter: Secondary | ICD-10-CM | POA: Diagnosis not present

## 2021-12-27 DIAGNOSIS — Z6841 Body Mass Index (BMI) 40.0 and over, adult: Secondary | ICD-10-CM | POA: Diagnosis not present

## 2021-12-27 DIAGNOSIS — Z87891 Personal history of nicotine dependence: Secondary | ICD-10-CM | POA: Insufficient documentation

## 2021-12-27 DIAGNOSIS — I10 Essential (primary) hypertension: Secondary | ICD-10-CM | POA: Insufficient documentation

## 2021-12-27 DIAGNOSIS — Z7901 Long term (current) use of anticoagulants: Secondary | ICD-10-CM | POA: Insufficient documentation

## 2021-12-27 DIAGNOSIS — M7522 Bicipital tendinitis, left shoulder: Secondary | ICD-10-CM | POA: Insufficient documentation

## 2021-12-27 DIAGNOSIS — M65812 Other synovitis and tenosynovitis, left shoulder: Secondary | ICD-10-CM | POA: Diagnosis not present

## 2021-12-27 DIAGNOSIS — I4891 Unspecified atrial fibrillation: Secondary | ICD-10-CM | POA: Diagnosis not present

## 2021-12-27 DIAGNOSIS — G8918 Other acute postprocedural pain: Secondary | ICD-10-CM | POA: Diagnosis not present

## 2021-12-27 DIAGNOSIS — M75112 Incomplete rotator cuff tear or rupture of left shoulder, not specified as traumatic: Secondary | ICD-10-CM | POA: Diagnosis not present

## 2021-12-27 DIAGNOSIS — M7582 Other shoulder lesions, left shoulder: Secondary | ICD-10-CM | POA: Diagnosis not present

## 2021-12-27 HISTORY — PX: SHOULDER ARTHROSCOPY WITH ROTATOR CUFF REPAIR AND OPEN BICEPS TENODESIS: SHX6677

## 2021-12-27 SURGERY — SHOULDER ARTHROSCOPY WITH ROTATOR CUFF REPAIR AND OPEN BICEPS TENODESIS
Anesthesia: General | Laterality: Left

## 2021-12-27 MED ORDER — PHENYLEPHRINE HCL (PRESSORS) 10 MG/ML IV SOLN
INTRAVENOUS | Status: AC
  Filled 2021-12-27: qty 1

## 2021-12-27 MED ORDER — BUPIVACAINE LIPOSOME 1.3 % IJ SUSP
INTRAMUSCULAR | Status: AC
  Filled 2021-12-27: qty 20

## 2021-12-27 MED ORDER — DEXTROSE 5 % IV SOLN
INTRAVENOUS | Status: DC | PRN
  Administered 2021-12-27: 3 g via INTRAVENOUS

## 2021-12-27 MED ORDER — BUPIVACAINE LIPOSOME 1.3 % IJ SUSP
INTRAMUSCULAR | Status: DC | PRN
  Administered 2021-12-27: 20 mL

## 2021-12-27 MED ORDER — DEXAMETHASONE SODIUM PHOSPHATE 10 MG/ML IJ SOLN
INTRAMUSCULAR | Status: DC | PRN
  Administered 2021-12-27: 10 mg via INTRAVENOUS

## 2021-12-27 MED ORDER — FENTANYL CITRATE (PF) 100 MCG/2ML IJ SOLN
INTRAMUSCULAR | Status: DC | PRN
  Administered 2021-12-27: 50 ug via INTRAVENOUS

## 2021-12-27 MED ORDER — SEVOFLURANE IN SOLN
RESPIRATORY_TRACT | Status: AC
  Filled 2021-12-27: qty 250

## 2021-12-27 MED ORDER — EPINEPHRINE PF 1 MG/ML IJ SOLN
INTRAMUSCULAR | Status: AC
  Filled 2021-12-27: qty 4

## 2021-12-27 MED ORDER — LIDOCAINE HCL (PF) 1 % IJ SOLN
INTRAMUSCULAR | Status: AC
  Filled 2021-12-27: qty 30

## 2021-12-27 MED ORDER — BUPIVACAINE-EPINEPHRINE (PF) 0.5% -1:200000 IJ SOLN
INTRAMUSCULAR | Status: AC
  Filled 2021-12-27: qty 30

## 2021-12-27 MED ORDER — CEFAZOLIN IN SODIUM CHLORIDE 3-0.9 GM/100ML-% IV SOLN
3.0000 g | INTRAVENOUS | Status: DC
  Filled 2021-12-27: qty 100

## 2021-12-27 MED ORDER — FAMOTIDINE 20 MG PO TABS
20.0000 mg | ORAL_TABLET | Freq: Once | ORAL | Status: AC
  Administered 2021-12-27: 20 mg via ORAL

## 2021-12-27 MED ORDER — BUPIVACAINE HCL (PF) 0.5 % IJ SOLN
INTRAMUSCULAR | Status: AC
  Filled 2021-12-27: qty 10

## 2021-12-27 MED ORDER — ACETAMINOPHEN 10 MG/ML IV SOLN
INTRAVENOUS | Status: DC | PRN
  Administered 2021-12-27: 1000 mg via INTRAVENOUS

## 2021-12-27 MED ORDER — MIDAZOLAM HCL 2 MG/2ML IJ SOLN: 1.0000 mg | Freq: Once | INTRAMUSCULAR | Status: DC

## 2021-12-27 MED ORDER — LACTATED RINGERS IV SOLN: INTRAVENOUS | Status: DC

## 2021-12-27 MED ORDER — ROCURONIUM BROMIDE 100 MG/10ML IV SOLN
INTRAVENOUS | Status: DC | PRN
  Administered 2021-12-27: 70 mg via INTRAVENOUS
  Administered 2021-12-27: 20 mg via INTRAVENOUS

## 2021-12-27 MED ORDER — FENTANYL CITRATE PF 50 MCG/ML IJ SOSY
PREFILLED_SYRINGE | INTRAMUSCULAR | Status: AC
  Administered 2021-12-27: 50 ug via INTRAVENOUS
  Filled 2021-12-27: qty 1

## 2021-12-27 MED ORDER — 0.9 % SODIUM CHLORIDE (POUR BTL) OPTIME
TOPICAL | Status: DC | PRN
  Administered 2021-12-27: 500 mL

## 2021-12-27 MED ORDER — CHLORHEXIDINE GLUCONATE 0.12 % MT SOLN
15.0000 mL | Freq: Once | OROMUCOSAL | Status: AC
  Administered 2021-12-27: 15 mL via OROMUCOSAL

## 2021-12-27 MED ORDER — ONDANSETRON HCL 4 MG/2ML IJ SOLN
INTRAMUSCULAR | Status: DC | PRN
  Administered 2021-12-27: 4 mg via INTRAVENOUS

## 2021-12-27 MED ORDER — MIDAZOLAM HCL 2 MG/2ML IJ SOLN
1.0000 mg | INTRAMUSCULAR | Status: AC | PRN
  Administered 2021-12-27: 1 mg via INTRAVENOUS

## 2021-12-27 MED ORDER — NEOMYCIN-POLYMYXIN B GU 40-200000 IR SOLN
Status: DC | PRN
  Administered 2021-12-27: 2 mL

## 2021-12-27 MED ORDER — BUPIVACAINE HCL (PF) 0.5 % IJ SOLN
INTRAMUSCULAR | Status: DC | PRN
  Administered 2021-12-27: 10 mL

## 2021-12-27 MED ORDER — FAMOTIDINE 20 MG PO TABS
ORAL_TABLET | ORAL | Status: AC
  Filled 2021-12-27: qty 1

## 2021-12-27 MED ORDER — RINGERS IRRIGATION IR SOLN
Status: DC | PRN
  Administered 2021-12-27 (×4): 6000 mL

## 2021-12-27 MED ORDER — PHENYLEPHRINE HCL-NACL 20-0.9 MG/250ML-% IV SOLN
INTRAVENOUS | Status: DC | PRN
  Administered 2021-12-27: 50 ug/min via INTRAVENOUS

## 2021-12-27 MED ORDER — FENTANYL CITRATE (PF) 100 MCG/2ML IJ SOLN
INTRAMUSCULAR | Status: AC
  Filled 2021-12-27: qty 2

## 2021-12-27 MED ORDER — PROPOFOL 10 MG/ML IV BOLUS
INTRAVENOUS | Status: DC | PRN
  Administered 2021-12-27: 200 mg via INTRAVENOUS

## 2021-12-27 MED ORDER — NEOMYCIN-POLYMYXIN B GU 40-200000 IR SOLN
Status: AC
  Filled 2021-12-27: qty 20

## 2021-12-27 MED ORDER — PHENYLEPHRINE HCL (PRESSORS) 10 MG/ML IV SOLN
INTRAVENOUS | Status: DC | PRN
  Administered 2021-12-27 (×3): 80 ug via INTRAVENOUS

## 2021-12-27 MED ORDER — CHLORHEXIDINE GLUCONATE 0.12 % MT SOLN
OROMUCOSAL | Status: AC
  Filled 2021-12-27: qty 15

## 2021-12-27 MED ORDER — ONDANSETRON 4 MG PO TBDP: 4.0000 mg | ORAL_TABLET | Freq: Three times a day (TID) | ORAL | 0 refills | Status: DC | PRN

## 2021-12-27 MED ORDER — CEFAZOLIN SODIUM-DEXTROSE 2-4 GM/100ML-% IV SOLN
INTRAVENOUS | Status: AC
  Filled 2021-12-27: qty 100

## 2021-12-27 MED ORDER — SUGAMMADEX SODIUM 200 MG/2ML IV SOLN
INTRAVENOUS | Status: DC | PRN
  Administered 2021-12-27: 300 mg via INTRAVENOUS

## 2021-12-27 MED ORDER — OXYCODONE HCL 5 MG PO TABS: 5.0000 mg | ORAL_TABLET | ORAL | 0 refills | Status: DC | PRN

## 2021-12-27 MED ORDER — MIDAZOLAM HCL 2 MG/2ML IJ SOLN
INTRAMUSCULAR | Status: AC
  Administered 2021-12-27: 1 mg via INTRAVENOUS
  Filled 2021-12-27: qty 2

## 2021-12-27 MED ORDER — PHENYLEPHRINE 80 MCG/ML (10ML) SYRINGE FOR IV PUSH (FOR BLOOD PRESSURE SUPPORT)
PREFILLED_SYRINGE | INTRAVENOUS | Status: AC
  Filled 2021-12-27: qty 10

## 2021-12-27 MED ORDER — FENTANYL CITRATE PF 50 MCG/ML IJ SOSY: 50.0000 ug | PREFILLED_SYRINGE | Freq: Once | INTRAMUSCULAR | Status: AC

## 2021-12-27 MED ORDER — ORAL CARE MOUTH RINSE: 15.0000 mL | Freq: Once | OROMUCOSAL | Status: AC

## 2021-12-27 MED ORDER — LACTATED RINGERS IV SOLN
INTRAVENOUS | Status: DC | PRN
  Administered 2021-12-27: 12004 mL

## 2021-12-27 MED ORDER — ACETAMINOPHEN 500 MG PO TABS: 1000.0000 mg | ORAL_TABLET | Freq: Three times a day (TID) | ORAL | 2 refills | Status: DC

## 2021-12-27 MED ORDER — MIDAZOLAM HCL 2 MG/2ML IJ SOLN
INTRAMUSCULAR | Status: AC
  Filled 2021-12-27: qty 2

## 2021-12-27 SURGICAL SUPPLY — 91 items
ADAPTER IRRIG TUBE 2 SPIKE SOL (ADAPTER) ×2 IMPLANT
ADPR TBG 2 SPK PMP STRL ASCP (ADAPTER) ×2
ANCH SUT 1.4 SUT TPE BLK/WHT (SUTURE) ×1
ANCH SUT 2 SWLK 19.1 CLS EYLT (Anchor) ×2 IMPLANT
ANCH SUT 2.9 PUSHLOCK ANCH (Orthopedic Implant) ×1 IMPLANT
ANCH SUT BN ASCP DLV (Anchor) ×1 IMPLANT
ANCH SUT RGNRT REGENETEN (Staple) ×2 IMPLANT
ANCHOR BONE REGENETEN (Anchor) IMPLANT
ANCHOR SWIVELOCK BIO 4.75X19.1 (Anchor) IMPLANT
ANCHOR TENDON REGENETEN (Staple) IMPLANT
APL PRP STRL LF DISP 70% ISPRP (MISCELLANEOUS) ×1
BLADE SHAVER 4.5X7 STR FR (MISCELLANEOUS) ×1 IMPLANT
BRUSH SCRUB EZ  4% CHG (MISCELLANEOUS) ×1
BRUSH SCRUB EZ 4% CHG (MISCELLANEOUS) ×1 IMPLANT
BUR BR 5.5 WIDE MOUTH (BURR) IMPLANT
BUR RADIUS 5.5 (BURR) ×1 IMPLANT
CANNULA PART THRD DISP 5.75X7 (CANNULA) IMPLANT
CANNULA PARTIAL THREAD 2X7 (CANNULA) ×1 IMPLANT
CANNULA TWIST IN 8.25X7CM (CANNULA) IMPLANT
CANNULA TWIST IN 8.25X9CM (CANNULA) IMPLANT
CHLORAPREP W/TINT 26 (MISCELLANEOUS) ×1 IMPLANT
COOLER POLAR GLACIER W/PUMP (MISCELLANEOUS) ×1 IMPLANT
DEVICE SUCT BLK HOLE OR FLOOR (MISCELLANEOUS) ×2 IMPLANT
DRAPE 3/4 80X56 (DRAPES) ×1 IMPLANT
DRAPE INCISE IOBAN 66X45 STRL (DRAPES) ×1 IMPLANT
DRAPE STERI 35X30 U-POUCH (DRAPES) ×1 IMPLANT
DRAPE U-SHAPE 47X51 STRL (DRAPES) ×2 IMPLANT
DRSG TEGADERM 4X4.75 (GAUZE/BANDAGES/DRESSINGS) ×2 IMPLANT
ELECT REM PT RETURN 9FT ADLT (ELECTROSURGICAL) ×1
ELECTRODE REM PT RTRN 9FT ADLT (ELECTROSURGICAL) IMPLANT
GAUZE SPONGE 4X4 12PLY STRL (GAUZE/BANDAGES/DRESSINGS) ×1 IMPLANT
GAUZE XEROFORM 1X8 LF (GAUZE/BANDAGES/DRESSINGS) ×1 IMPLANT
GLOVE BIO SURGEON STRL SZ7.5 (GLOVE) ×1 IMPLANT
GLOVE BIOGEL PI IND STRL 8 (GLOVE) ×2 IMPLANT
GLOVE SURG ORTHO 8.0 STRL STRW (GLOVE) ×1 IMPLANT
GLOVE SURG SYN 7.5  E (GLOVE) ×1
GLOVE SURG SYN 7.5 E (GLOVE) ×1 IMPLANT
GLOVE SURG SYN 7.5 PF PI (GLOVE) ×1 IMPLANT
GOWN STRL REUS W/ TWL LRG LVL3 (GOWN DISPOSABLE) ×2 IMPLANT
GOWN STRL REUS W/TWL LRG LVL3 (GOWN DISPOSABLE) ×2
GOWN STRL REUS W/TWL LRG LVL4 (GOWN DISPOSABLE) ×1 IMPLANT
IMPL REGENETEN MEDIUM (Shoulder) IMPLANT
IMPLANT REGENETEN MEDIUM (Shoulder) ×1 IMPLANT
IV LACTATED RINGER IRRG 3000ML (IV SOLUTION) ×11
IV LR IRRIG 3000ML ARTHROMATIC (IV SOLUTION) ×4 IMPLANT
KIT STABILIZATION SHOULDER (MISCELLANEOUS) ×1 IMPLANT
KIT SUTURETAK 3.0 INSERT PERC (KITS) ×1 IMPLANT
KIT TURNOVER KIT A (KITS) ×1 IMPLANT
MANIFOLD NEPTUNE II (INSTRUMENTS) ×2 IMPLANT
MASK FACE SPIDER DISP (MASK) ×1 IMPLANT
MAT ABSORB  FLUID 56X50 GRAY (MISCELLANEOUS) ×2
MAT ABSORB FLUID 56X50 GRAY (MISCELLANEOUS) ×2 IMPLANT
NDL SAFETY ECLIP 18X1.5 (MISCELLANEOUS) ×1 IMPLANT
NEEDLE HYPO 22GX1.5 SAFETY (NEEDLE) ×1 IMPLANT
NS IRRIG 500ML POUR BTL (IV SOLUTION) ×1 IMPLANT
PACK ARTHROSCOPY SHOULDER (MISCELLANEOUS) ×1 IMPLANT
PAD ABD DERMACEA PRESS 5X9 (GAUZE/BANDAGES/DRESSINGS) ×1 IMPLANT
PAD ARMBOARD 7.5X6 YLW CONV (MISCELLANEOUS) ×1 IMPLANT
PAD WRAPON POLAR SHDR XLG (MISCELLANEOUS) ×1 IMPLANT
PASSER SUT FIRSTPASS SELF (INSTRUMENTS) ×1 IMPLANT
SHAVER BLADE BONE CUTTER  5.5 (BLADE)
SHAVER BLADE BONE CUTTER 5.5 (BLADE) IMPLANT
SLEEVE REMOTE CONTROL 5X12 (DRAPES) IMPLANT
SLING ULTRA II M (MISCELLANEOUS) ×1 IMPLANT
SPONGE T-LAP 18X18 ~~LOC~~+RFID (SPONGE) ×1 IMPLANT
STRAP SAFETY 5IN WIDE (MISCELLANEOUS) ×1 IMPLANT
SUT ETHILON 3-0 FS-10 30 BLK (SUTURE) ×1
SUT FIBERWIRE #2 38 BLUE 1/2 (SUTURE) ×2
SUT LASSO 90 DEG SD STR (SUTURE) ×1 IMPLANT
SUT MNCRL 4-0 (SUTURE) ×1
SUT MNCRL 4-0 27XMFL (SUTURE) ×1
SUT PDS AB 0 CT1 27 (SUTURE) IMPLANT
SUT VIC AB 0 CT1 36 (SUTURE) ×1 IMPLANT
SUT VIC AB 2-0 CT2 27 (SUTURE) ×1 IMPLANT
SUT XBRAID 1.4 BLK/WHT (SUTURE) IMPLANT
SUTURE EHLN 3-0 FS-10 30 BLK (SUTURE) ×1 IMPLANT
SUTURE FIBERWR #2 38 BLUE 1/2 (SUTURE) IMPLANT
SUTURE MNCRL 4-0 27XMF (SUTURE) ×1 IMPLANT
SUTURE TAPE 1.3 40 TPR END (SUTURE) IMPLANT
SUTURETAPE 1.3 40 TPR END (SUTURE) ×2
SYR 10ML LL (SYRINGE) ×1 IMPLANT
SYSTEM IMPL TENODESIS LNT 2.9 (Orthopedic Implant) IMPLANT
TAPE CLOTH 3X10 WHT NS LF (GAUZE/BANDAGES/DRESSINGS) ×1 IMPLANT
TRAP FLUID SMOKE EVACUATOR (MISCELLANEOUS) ×1 IMPLANT
TUBING CONNECTING 10 (TUBING) ×1 IMPLANT
TUBING INFLOW SET DBFLO PUMP (TUBING) ×1 IMPLANT
TUBING OUTFLOW SET DBLFO PUMP (TUBING) ×1 IMPLANT
WAND WEREWOLF FLOW 90D (MISCELLANEOUS) ×1 IMPLANT
WATER STERILE IRR 500ML POUR (IV SOLUTION) ×1 IMPLANT
WRAP SHOULDER HOT/COLD PACK (SOFTGOODS) ×1 IMPLANT
WRAPON POLAR PAD SHDR XLG (MISCELLANEOUS) ×1

## 2021-12-27 NOTE — Discharge Instructions (Addendum)
Post-Op Instructions - Rotator Cuff Repair  1. Bracing: You will wear a shoulder immobilizer or sling for 6 weeks.   2. Driving: No driving for 3 weeks post-op. When driving, do not wear the immobilizer. Ideally, we recommend no driving for 6 weeks while sling is in place as one arm will be immobilized.   3. Activity: No active lifting for 2 months. Wrist, hand, and elbow motion only. Avoid lifting the upper arm away from the body except for hygiene. You are permitted to bend and straighten the elbow passively only (no active elbow motion). You may use your hand and wrist for typing, writing, and managing utensils (cutting food). Do not lift more than a coffee cup for 8 weeks.  When sleeping or resting, inclined positions (recliner chair or wedge pillow) and a pillow under the forearm for support may provide better comfort for up to 4 weeks.  Avoid long distance travel for 4 weeks.  Return to normal activities after rotator cuff repair repair normally takes 6 months on average. If rehab goes very well, may be able to do most activities at 4 months, except overhead or contact sports.  4. Physical Therapy: Begins 3-4 days after surgery, and proceed 1 time per week for the first 6 weeks, then 1-2 times per week from weeks 6-20 post-op.  5. Medications:  - You will be provided a prescription for narcotic pain medicine. After surgery, take 1-2 narcotic tablets every 4 hours if needed for severe pain.  - A prescription for anti-nausea medication will be provided in case the narcotic medicine causes nausea - take 1 tablet every 6 hours only if nauseated.   - Take tylenol 1000 mg (2 Extra Strength tablets or 3 regular strength) every 8 hours for pain.  May decrease or stop tylenol 5 days after surgery if you are having minimal pain. - Resume home dose of Xarelto the day after surgery. - DO NOT take ANY nonsteroidal anti-inflammatory pain medications (Advil, Motrin, Ibuprofen, Aleve, Naproxen, or Naprosyn).  These medicines can inhibit healing of your shoulder repair.    If you are taking prescription medication for anxiety, depression, insomnia, muscle spasm, chronic pain, or for attention deficit disorder, you are advised that you are at a higher risk of adverse effects with use of narcotics post-op, including narcotic addiction/dependence, depressed breathing, death. If you use non-prescribed substances: alcohol, marijuana, cocaine, heroin, methamphetamines, etc., you are at a higher risk of adverse effects with use of narcotics post-op, including narcotic addiction/dependence, depressed breathing, death. You are advised that taking > 50 morphine milligram equivalents (MME) of narcotic pain medication per day results in twice the risk of overdose or death. For your prescription provided: oxycodone 5 mg - taking more than 6 tablets per day would result in > 50 morphine milligram equivalents (MME) of narcotic pain medication. Be advised that we will prescribe narcotics short-term, for acute post-operative pain only - 3 weeks for major operations such as shoulder repair/reconstruction surgeries.     6. Post-Op Appointment:  Your first post-op appointment will be 10-14 days post-op.  7. Work or School: For most, but not all procedures, we advise staying out of work or school for at least 1 to 2 weeks in order to recover from the stress of surgery and to allow time for healing.   If you need a work or school note this can be provided.   8. Smoking: If you are a smoker, you need to refrain from smoking in the postoperative  period. The nicotine in cigarettes will inhibit healing of your shoulder repair and decrease the chance of successful repair. Similarly, nicotine containing products (gum, patches) should be avoided.   Post-operative Brace: Apply and remove the brace you received as you were instructed to at the time of fitting and as described in detail as the brace's instructions for use indicate.   Wear the brace for the period of time prescribed by your physician.  The brace can be cleaned with soap and water and allowed to air dry only.  Should the brace result in increased pain, decreased feeling (numbness/tingling), increased swelling or an overall worsening of your medical condition, please contact your doctor immediately.  If an emergency situation occurs as a result of wearing the brace after normal business hours, please dial 911 and seek immediate medical attention.  Let your doctor know if you have any further questions about the brace issued to you. Refer to the shoulder sling instructions for use if you have any questions regarding the correct fit of your shoulder sling.  [place] for Troubleshooting: [phone]  Video that illustrates how to properly use a shoulder sling: "Instructions for Proper Use of an Orthopaedic Sling" [email or web address]  AMBULATORY SURGERY  DISCHARGE INSTRUCTIONS   The drugs that you were given will stay in your system until tomorrow so for the next 24 hours you should not:  Drive an automobile Make any legal decisions Drink any alcoholic beverage   You may resume regular meals tomorrow.  Today it is better to start with liquids and gradually work up to solid foods.  You may eat anything you prefer, but it is better to start with liquids, then soup and crackers, and gradually work up to solid foods.   Please notify your doctor immediately if you have any unusual bleeding, trouble breathing, redness and pain at the surgery site, drainage, fever, or pain not relieved by medication.    Additional Instructions:     Please contact your physician with any problems or Same Day Surgery at [phone], Monday through Friday 6 am to 4 pm, or [place] at [facility] number at [phone].

## 2021-12-27 NOTE — Anesthesia Procedure Notes (Signed)
Procedure Name: Intubation Date/Time: 12/27/2021 1:36 AM  Performed by: Lorie Apley, CRNAPre-anesthesia Checklist: Patient identified, Patient being monitored, Timeout performed, Emergency Drugs available and Suction available Patient Re-evaluated:Patient Re-evaluated prior to induction Oxygen Delivery Method: Circle system utilized Preoxygenation: Pre-oxygenation with 100% oxygen Induction Type: IV induction Ventilation: Mask ventilation without difficulty Laryngoscope Size: Mac, 3 and McGraph Grade View: Grade I Tube type: Oral Tube size: 7.5 mm Number of attempts: 1 Airway Equipment and Method: Stylet Placement Confirmation: ETT inserted through vocal cords under direct vision, positive ETCO2 and breath sounds checked- equal and bilateral Secured at: 21 cm Tube secured with: Tape Dental Injury: Teeth and Oropharynx as per pre-operative assessment

## 2021-12-27 NOTE — Anesthesia Procedure Notes (Signed)
Anesthesia Regional Block: Interscalene brachial plexus block   Pre-Anesthetic Checklist: , timeout performed,  Correct Patient, Correct Site, Correct Laterality,  Correct Procedure, Correct Position, site marked,  Risks and benefits discussed,  Surgical consent,  Pre-op evaluation,  At surgeon's request and post-op pain management  Laterality: Left  Prep: chloraprep       Needles:  Injection technique: Single-shot  Needle Type: Echogenic Needle     Needle Length: 4cm  Needle Gauge: 25     Additional Needles:   Procedures:,,,, ultrasound used (permanent image in chart),,    Narrative:  Start time: 12/27/2021 1:15 PM End time: 12/27/2021 1:17 PM Injection made incrementally with aspirations every 5 mL.  Performed by: Personally  Anesthesiologist: Dimas Millin, MD  Additional Notes: Patient's chart reviewed and they were deemed appropriate candidate for procedure, at surgeon's request. Patient educated about risks, benefits, and alternatives of the block including but not limited to: temporary or permanent nerve damage, bleeding, infection, damage to surround tissues, pneumothorax, hemidiaphragmatic paralysis, unilateral Horner's syndrome, block failure, local anesthetic toxicity. Patient expressed understanding. A formal time-out was conducted consistent with institution rules.  Monitors were applied, and minimal sedation used (see nursing record). The site was prepped with skin prep and allowed to dry, and sterile gloves were used. A high frequency linear ultrasound probe with probe cover was utilized throughout. C5-7 nerve roots located and appeared anatomically normal, local anesthetic injected around them, and echogenic block needle trajectory was monitored throughout. Aspiration performed every 30m. Lung and blood vessels were avoided. All injections were performed without resistance and free of blood and paresthesias. The patient tolerated the procedure well.  Injectate:  228mexparel + 1030m.5% bupivacaine

## 2021-12-27 NOTE — H&P (Signed)
Paper H&P to be scanned into permanent record. H&P reviewed. No significant changes noted.  

## 2021-12-27 NOTE — Transfer of Care (Signed)
Immediate Anesthesia Transfer of Care Note  Patient: [name]  Procedure(s) Performed: Left shoulder arthroscopic cuffrepair with possible Regeneten Patch application, biceps tenodesis, distal clavicle excision, subacromial decompression (Left)  Patient Location: PACU  Anesthesia Type:General  Level of Consciousness: awake, alert , and oriented  Airway & Oxygen Therapy: Patient Spontanous Breathing and Patient connected to nasal cannula oxygen  Post-op Assessment: Report given to RN and Post -op Vital signs reviewed and stable  Post vital signs: Reviewed and stable  Last Vitals:  Vitals Value Taken Time  BP 116/77 12/27/21 1653  Temp 97.3   Pulse 76 12/27/21 1656  Resp 15 12/27/21 1656  SpO2 97 % 12/27/21 1656  Vitals shown include unvalidated device data.  Last Pain:  Vitals:   12/27/21 1310  TempSrc:   PainSc: 0-No pain         Complications: No notable events documented.

## 2021-12-27 NOTE — Op Note (Addendum)
SURGERY DATE: 12/27/2021   PRE-OP DIAGNOSIS:  1. Left partial thickness rotator cuff tear 2. Left biceps tendinopathy  POST-OP DIAGNOSIS: 1. Left rotator cuff (full-thickness subscapularis, partial-thickness bursal sided supraspinatus tear) 2. Left biceps tendinopathy  PROCEDURES:  1. Left arthroscopic rotator cuff repair (subscapularis, supraspinatus side-to-side repair + Regeneten patch) 2. Left arthroscopic biceps tenodesis 3. Left extensive debridement of shoulder (glenohumeral and subacromial spaces)  SURGEON: Wayne Mulligan, MD  ASSISTANT: none  ANESTHESIA: Gen with interscalene block w/Exparel  ESTIMATED BLOOD LOSS: 5cc  DRAINS:  none  TOTAL IV FLUIDS: per anesthesia   SPECIMENS: none  IMPLANTS:  - [employer] Regeneten patch with associated tendon and bone staples - Arthrex 2.9m PushLock anchor (arthroscopic biceps tenodesis) - Arthrex 4.71mSwiveLock x2 (subscapularis repair)  OPERATIVE FINDINGS:  Examination under anesthesia: A careful examination under anesthesia was performed.  Passive range of motion was: FF: 145; ER at side: 60; ER in abduction: 95; IR in abduction: 45.  Anterior load shift: NT.  Posterior load shift: NT.  Sulcus in neutral: NT.  Sulcus in ER: NT.    Intra-operative findings: A thorough arthroscopic examination of the shoulder was performed.  The findings are: 1. Biceps tendon: Significant tendinopathy with erythema 2. Superior labrum: Type 2 SLAP tear with significant erythema and degeneration 3. Posterior labrum and capsule: Significant synovitis 4. Inferior capsule and inferior recess: normal 5. Glenoid cartilage surface: Normal 6. Supraspinatus attachment: partial thickness tearing of the bursal surface at the musculotendinous junction  7. Posterior rotator cuff attachment: normal  8. Humeral head articular cartilage: Grade 2 degenerative changes diffusely 9. Rotator interval: severely synovitic 10: Subscapularis tendon:  Full-thickness, retracted tear 11. Anterior labrum: degenerative tearing 12. IGHL: normal  OPERATIVE REPORT:   Indications for procedure: [name] a [age].o. male with ~6 months of left shoulder pain. Clinical exam and MRI were significant for partial thickness rotator cuff tear and biceps tendinopathy.  Patient had attempted nonoperative management without improvement in his symptoms.  After discussion of risks, benefits, and alternatives to surgery, the patient elected to proceed with above mentioned procedure. The patient understands that use of the Regeneten patch is relatively new and long-term data is unknown.  Procedure in detail:  I identified [name] in the pre-operative holding area.  I marked the operative shoulder with my initials. I reviewed the risks and benefits of the proposed surgical intervention, and the patient wished to proceed.  Anesthesia was then performed with an interscalene block with Exparel.  The patient was transferred to the operative suite and placed in the beach chair position.    SCDs were placed on the lower extremities. Appropriate IV antibiotics were administered. The operative upper extremity was then prepped and draped in standard fashion. A time out was performed confirming the correct extremity, correct patient and correct procedure.   I then created a standard posterior portal with an 11 blade. The glenohumeral joint was easily entered with a blunt trochar and the arthroscope introduced. The findings of diagnostic arthroscopy are described above. I debrided the degenerative anterior labrum, superior labrum, posterior labrum were also debrided and coagulated the inflamed synovium to obtain hemostasis and reduce the risk of post-operative swelling using an Arthrocare radiofrequency device  I then turned my attention to the arthroscopic biceps tenodesis.  I used the loop n tack technique to pass a fiber tape through the biceps in a locked fashion  adjacent to the biceps anchor.  The biceps tendon was cut  at its attachment on the superior labrum.  The remnant stump was debrided down to a stable base on the biceps anchor complex using an oscillating shaver.  A hole for a 2.9 mm Arthrex PushLock was drilled in the bicipital groove just superior to the subscapularis tendon insertion.  The fiber tape was loaded onto the PushLock anchor and impacted into place into the previously drilled hole in the bicipital groove.  This appropriately secured the biceps into the bicipital groove and took it off of tension.  The subscapularis tear was identified by localizing the comma tissue.  It was retracted beyond the level of the glenoid.  An accessory anterolateral portal was made and a cannula was placed. The tip of the coracoid as well as the conjoined tendon and coracoacromial ligaments were visualized after debriding rotator interval tissue.  Tissue about the subscapularis was released anteriorly, superiorly, and posteriorly to allow for improved mobilization.  The comma tissue was tagged with a FiberWire suture and tension on this allowed for appropriate mobilization of the subscapularis.  The lesser tuberosity footprint was prepared with a combination of electrocautery arthroscopic curette.  A 70 degree arthroscope was utilized for this.  The footprint was medialized given the amount of retraction mobility of the subscapularis was achieved.  2 suture tapes were passed in a mattress fashion.  The traction stitch was removed.  Sutures were then brought out of the anterior cannula for later fixation.    Next, the arthroscope was then introduced into the subacromial space. A lateral portal was created with an 11-blade after spinal needle localization. An extensive subacromial bursectomy and debridement was performed using a combination of the shaver and Arthrocare wand.   The partial-thickness bursal sided supraspinatus tear was identified readily due to the poor  quality tissue in this region.  An oscillating shaver was used to remove the damaged tissue.  This was located at the musculotendinous junction.  I attempted to push a switching stick through this region into the joint, but I could not do so.  Given the defect, I placed 2 side-to-side (anterior to posterior stitches) across this defect using a suture lasso and shuttling a suture tape through the torn region of the rotator cuff.  Arthroscopic knots were tied reduced the bursal sided tear.  To augment this repair, we decided to proceed with Regeneten patch placement. The Regeneten patch delivery gun was placed appropriately and the patch was delivered over the supraspinatus tendon. It was positioned such the medial border of the patch was just medial to region of the supraspinatus defect. Tendon staples were placed medially, anteriorly, laterally, and posteriorly after making appropriate stab incisions for portal placement.The patch was then probed to confirm appropriate stability.   Next, I reentered the glenohumeral joint and the arm was placed in internal rotation. All 4 strands of suture that had previously been passed through the subscapularis were then loaded onto a 4.75 mm SwiveLock anchor and placed just lateral to the prepared footprint.  There was still some mild gapping of the supraspinatus off of the footprint so SwiveLock anchor was placed in the footprint itself.  The knotless mechanism was utilized to further compress the subscapularis to the native footprint.  The arm was then gently internally and externally rotated and the subscapularis was noted to move appropriately with rotation.  The remainder of the suture was then cut.   Arthroscopic fluid was evacuated from the joint.  The portals were closed with 3-0 Nylon. Xeroform was applied to the  portals. A sterile dressing was applied, followed by a Polar Care sleeve and a SlingShot shoulder immobilizer/sling. The patient awoke from anesthesia  without difficulty and was transferred to the PACU in stable condition.    Additionally, this case had increased complexity compared to standard arthroscopic rotator cuff repair or the supraspinatus given the involvement of the subscapularis tear. Repair of this tear increased surgical time by 45 minutes and increased complexity due to additional preparation and repair of subscapularis tear, use of additional implants, and creation of an accessory portal, all of which would have otherwise not occurred.   COMPLICATIONS: none  DISPOSITION: plan for discharge home after recovery in PACU  POSTOPERATIVE PLAN: Remain in sling (except hygiene and elbow/wrist/hand RoM exercises as instructed by PT) x 6 weeks and nonweightbearing for this time.  Utilize large rotator cuff repair rehab protocol with subscapularis restrictions.  Avoid any external rotation beyond neutral x 6 weeks.  Resume home anticoagulation on postop day 1.  Physical therapy to start within 1 week.  Follow-up with me in 2 weeks.

## 2021-12-27 NOTE — Anesthesia Postprocedure Evaluation (Signed)
Anesthesia Post Note  Patient: [name]  Procedure(s) Performed: Left shoulder arthroscopic cuff repair (supraspinatus and subscapularis) with Regeneten Patch application (Left)  Patient location during evaluation: PACU Anesthesia Type: General Level of consciousness: awake and alert Pain management: pain level controlled Vital Signs Assessment: post-procedure vital signs reviewed and stable Respiratory status: spontaneous breathing, nonlabored ventilation and respiratory function stable Cardiovascular status: blood pressure returned to baseline and stable Postop Assessment: no apparent nausea or vomiting Anesthetic complications: no   No notable events documented.   Last Vitals:  Vitals:   12/27/21 1716 12/27/21 1729  BP: 131/71 135/87  Pulse: 77 77  Resp: 18 16  Temp:  (!) 36.3 C  SpO2: 95% 95%    Last Pain:  Vitals:   12/27/21 1729  TempSrc: Temporal  PainSc: 0-No pain                 [place] Ouch

## 2021-12-27 NOTE — Anesthesia Preprocedure Evaluation (Signed)
Anesthesia Evaluation  Patient identified by MRN, date of birth, ID band Patient awake    Reviewed: Allergy & Precautions, H&P , NPO status , Patient's Chart, lab work & pertinent test results  History of Anesthesia Complications Negative for: history of anesthetic complications  Airway Mallampati: III  TM Distance: <3 FB Neck ROM: limited    Dental  (+) Chipped, Poor Dentition, Dental Advidsory Given   Pulmonary neg pulmonary ROS, neg shortness of breath, neg COPD, former smoker          Cardiovascular Exercise Tolerance: Good hypertension, (-) angina + CAD  (-) Past MI + dysrhythmias Atrial Fibrillation      Neuro/Psych Seizures - (2/2 A fib per patient),   Neuromuscular disease  negative psych ROS   GI/Hepatic negative GI ROS, Neg liver ROS,neg GERD  ,,  Endo/Other    Morbid obesity  Renal/GU Renal disease  negative genitourinary   Musculoskeletal  (+) Arthritis ,    Abdominal   Peds  Hematology negative hematology ROS (+)   Anesthesia Other Findings Past Medical History: No date: Cardiomyopathy (in setting of Afib)     Comment:  a. 12/2013 Echo: EF 45-50%, mild ant and antsept HK.               mild MR. Mod dil LA. nl RV fxn. Rhythm was Afib. No date: DJD (degenerative joint disease) of knee No date: History of kidney stones No date: Intervertebral disc disorder with radiculopathy of  lumbosacral region 2012: Kidney stone No date: Lower extremity edema No date: PAF (paroxysmal atrial fibrillation) (HCC) No date: Pernicious anemia No date: Prostate cancer ([place]) No date: Pulmonary nodule, right     Comment:  a. 10/2014 Cardiac CTA: 55m RLL nodule; b. 04/2015 CT               Chest: stable 726mRLL nodule. No new nodules; 02/2017 CTA               Chest: stable, benign, 77m177mLL pulm nodule. No date: SVT (supraventricular tachycardia) ([facility] Surgical History: 12/23/2014: CARPAL TUNNEL RELEASE; Right      Comment:  Procedure: CARPAL TUNNEL RELEASE;  Surgeon: HowEarnestine LeysD;  Location: [facility];  Service: Orthopedics;                Laterality: Right; 01/06/2015: CARPAL TUNNEL RELEASE; Left     Comment:  Procedure: CARPAL TUNNEL RELEASE;  Surgeon: HowEarnestine LeysD;  Location: [facility] OR[facility];  Service: Orthopedics;                Laterality: Left; 10/08/2017: COLONOSCOPY WITH PROPOFOL; N/A     Comment:  Procedure: COLONOSCOPY WITH PROPOFOL;  Surgeon: VanLin LandsmanD;  Location: [facility];  Service:               Gastroenterology;  Laterality: N/A; 2002: GASTRIC BYPASS No date: KNEE SURGERY     Comment:  knee trauma No date: prostate seeding     Reproductive/Obstetrics negative OB ROS                             Anesthesia Physical Anesthesia Plan  ASA: 3  Anesthesia Plan: General ETT  Post-op Pain Management: Regional block*   Induction: Intravenous  PONV Risk Score and Plan: 2 and Ondansetron, Dexamethasone and Treatment may vary due to age or medical condition  Airway Management Planned: Oral ETT  Additional Equipment:   Intra-op Plan:   Post-operative Plan: Extubation in OR  Informed Consent: I have reviewed the patients History and Physical, chart, labs and discussed the procedure including the risks, benefits and alternatives for the proposed anesthesia with the patient or authorized representative who has indicated his/her understanding and acceptance.     Dental Advisory Given  Plan Discussed with: Anesthesiologist, CRNA and Surgeon  Anesthesia Plan Comments: (Patient consented for risks of anesthesia including but not limited to:  - adverse reactions to medications - damage to eyes, teeth, lips or other oral mucosa - nerve damage due to positioning  - sore throat or hoarseness - Damage to heart, brain, nerves, lungs, other parts of body or loss of life  Patient voiced  understanding.)        Anesthesia Quick Evaluation

## 2021-12-28 ENCOUNTER — Encounter: Payer: Self-pay | Admitting: Orthopedic Surgery

## 2022-01-02 ENCOUNTER — Ambulatory Visit
Payer: Medicare Other | Attending: Student in an Organized Health Care Education/Training Program | Admitting: Student in an Organized Health Care Education/Training Program

## 2022-01-02 ENCOUNTER — Ambulatory Visit
Admission: RE | Admit: 2022-01-02 | Discharge: 2022-01-02 | Disposition: A | Discharge status: Home / Self Care | Payer: Medicare Other | Source: Ambulatory Visit | Attending: Student in an Organized Health Care Education/Training Program | Admitting: Student in an Organized Health Care Education/Training Program

## 2022-01-02 ENCOUNTER — Encounter: Payer: Self-pay | Admitting: Student in an Organized Health Care Education/Training Program

## 2022-01-02 DIAGNOSIS — M47816 Spondylosis without myelopathy or radiculopathy, lumbar region: Secondary | ICD-10-CM

## 2022-01-02 DIAGNOSIS — G894 Chronic pain syndrome: Secondary | ICD-10-CM | POA: Diagnosis not present

## 2022-01-02 MED ORDER — ROPIVACAINE HCL 2 MG/ML IJ SOLN
9.0000 mL | Freq: Once | INTRAMUSCULAR | Status: AC
  Administered 2022-01-02: 9 mL via PERINEURAL

## 2022-01-02 MED ORDER — LIDOCAINE HCL 2 % IJ SOLN
20.0000 mL | Freq: Once | INTRAMUSCULAR | Status: AC
  Administered 2022-01-02: 400 mg

## 2022-01-02 MED ORDER — DEXAMETHASONE SODIUM PHOSPHATE 10 MG/ML IJ SOLN
INTRAMUSCULAR | Status: AC
  Filled 2022-01-02: qty 1

## 2022-01-02 MED ORDER — ROPIVACAINE HCL 2 MG/ML IJ SOLN
INTRAMUSCULAR | Status: AC
  Filled 2022-01-02: qty 20

## 2022-01-02 MED ORDER — DIAZEPAM 5 MG PO TABS
ORAL_TABLET | ORAL | Status: AC
  Filled 2022-01-02: qty 1

## 2022-01-02 MED ORDER — LIDOCAINE HCL 2 % IJ SOLN
INTRAMUSCULAR | Status: AC
  Filled 2022-01-02: qty 20

## 2022-01-02 MED ORDER — DEXAMETHASONE SODIUM PHOSPHATE 10 MG/ML IJ SOLN
10.0000 mg | Freq: Once | INTRAMUSCULAR | Status: AC
  Administered 2022-01-02: 10 mg

## 2022-01-02 MED ORDER — DIAZEPAM 5 MG PO TABS
5.0000 mg | ORAL_TABLET | ORAL | Status: AC
  Administered 2022-01-02: 5 mg via ORAL

## 2022-01-02 NOTE — Progress Notes (Signed)
PROVIDER NOTE: Interpretation of information contained herein should be left to medically-trained personnel. Specific patient instructions are provided elsewhere under "Patient Instructions" section of medical record. This document was created in part using STT-dictation technology, any transcriptional errors that may result from this process are unintentional.  Patient: [name] Type: Established DOB: 02-21-1955 MRN: [id number] PCP: Olin Hauser, DO  Service: Procedure DOS: 01/02/2022 Setting: Ambulatory Location: Ambulatory outpatient facility Delivery: Face-to-face Provider: Gillis Santa, MD Specialty: Interventional Pain Management Specialty designation: 09 Location: Outpatient facility Ref. Prov.: Gillis Santa, MD    Primary Reason for Visit: Interventional Pain Management Treatment. CC: Back Pain (Lumbar right )  Procedure:           Type: Lumbar Facet, Medial Branch Radiofrequency Ablation (RFA) #2  Laterality: Right  Level: L3, L4, L5, Medial Branch Level(s). These levels will denervate the L3-4 and L5-S1 lumbar facet joints.  Imaging: Fluoroscopic guidance Anesthesia: Local anesthesia (1-2% Lidocaine) Anxiolysis: Valium 5 mg p.o. DOS: 01/02/2022  Performed by: Gillis Santa, MD  Purpose: Therapeutic/Palliative Indications: Low back pain severe enough to impact quality of life or function. Indications: 1. Lumbar facet arthropathy   2. Lumbar facet joint syndrome   3. Chronic pain syndrome    [name] has been dealing with the above chronic pain for longer than three months and has either failed to respond, was unable to tolerate, or simply did not get enough benefit from other more conservative therapies including, but not limited to: 1. Over-the-counter medications 2. Anti-inflammatory medications 3. Muscle relaxants 4. Membrane stabilizers 5. Opioids 6. Physical therapy and/or chiropractic manipulation 7. Modalities (Heat, ice, etc.) 8. Invasive  techniques such as nerve blocks. [name] has attained more than 50% relief of the pain from a series of diagnostic injections conducted in separate occasions.  Pain Score: Pre-procedure: 2 /10 Post-procedure: 0-No pain/10     Patient discontinued his Xarelto 3 days prior.  Position / Prep / Materials:  Position: Prone  Prep solution: DuraPrep (Iodine Povacrylex [0.7% available iodine] and Isopropyl Alcohol, 74% w/w) Prep Area: Entire Lumbosacral Region (Lower back from mid-thoracic region to end of tailbone and from flank to flank.) Materials:  Tray: RFA (Radiofrequency) tray Needle(s):  Type: RFA (Teflon-coated radiofrequency ablation needles) Gauge (G): 22  Length: Long (15cm) Qty: 3  Pre-op H&P Assessment:  [name] is a 66 y.o. (year old), male patient, seen today for interventional treatment. He  has a past surgical history that includes Knee surgery (Right); Gastric bypass (01/17/2000); prostate seeding; Carpal tunnel release (Right, 12/23/2014); Carpal tunnel release (Left, 01/06/2015); Colonoscopy with propofol (N/A, 10/08/2017); Cardioversion (N/A, 05/15/2019); Cardioversion (N/A, 03/13/2014); Reverse shoulder arthroplasty (Right, 09/27/2021); Bicept tenodesis (Right, 09/27/2021); and Shoulder arthroscopy with rotator cuff repair and open biceps tenodesis (Left, 12/27/2021). [name] has a current medication list which includes the following prescription(s): acetaminophen, amiodarone, amlodipine, aspirin ec, benazepril, buspirone, carboxymethylcellulose, cyanocobalamin, cyclobenzaprine, duloxetine, hydrocodone-acetaminophen, [START ON 01/20/2022] hydrocodone-acetaminophen, melatonin, metoprolol succinate, multivitamin with minerals, ondansetron, oxycodone, xarelto, and trazodone. His primarily concern today is the Back Pain (Lumbar right )  Initial Vital Signs:  Pulse/HCG Rate:  80  Temp:  (!) 96.7 F (35.9 C) Resp: 16 BP:  (!) 133/95 SpO2: 97 %  BMI: Estimated body  mass index is 43.4 kg/m as calculated from the following:   Height as of this encounter: 6' (1.829 m).   Weight as of this encounter: 320 lb (145.2 kg).  Risk Assessment: Allergies: Reviewed. He has No Known Allergies.  Allergy Precautions: None  required Coagulopathies: Reviewed. None identified.  Blood-thinner therapy: None at this time Active Infection(s): Reviewed. None identified. [name] is afebrile  Site Confirmation: [name] was asked to confirm the procedure and laterality before marking the site Procedure checklist: Completed Consent: Before the procedure and under the influence of no sedative(s), amnesic(s), or anxiolytics, the patient was informed of the treatment options, risks and possible complications. To fulfill our ethical and legal obligations, as recommended by the American Medical Association's Code of Ethics, I have informed the patient of my clinical impression; the nature and purpose of the treatment or procedure; the risks, benefits, and possible complications of the intervention; the alternatives, including doing nothing; the risk(s) and benefit(s) of the alternative treatment(s) or procedure(s); and the risk(s) and benefit(s) of doing nothing. The patient was provided information about the general risks and possible complications associated with the procedure. These may include, but are not limited to: failure to achieve desired goals, infection, bleeding, organ or nerve damage, allergic reactions, paralysis, and death. In addition, the patient was informed of those risks and complications associated to Spine-related procedures, such as failure to decrease pain; infection (i.e.: Meningitis, epidural or intraspinal abscess); bleeding (i.e.: epidural hematoma, subarachnoid hemorrhage, or any other type of intraspinal or peri-dural bleeding); organ or nerve damage (i.e.: Any type of peripheral nerve, nerve root, or spinal cord injury) with subsequent damage to sensory,  motor, and/or autonomic systems, resulting in permanent pain, numbness, and/or weakness of one or several areas of the body; allergic reactions; (i.e.: anaphylactic reaction); and/or death. Furthermore, the patient was informed of those risks and complications associated with the medications. These include, but are not limited to: allergic reactions (i.e.: anaphylactic or anaphylactoid reaction(s)); adrenal axis suppression; blood sugar elevation that in diabetics may result in ketoacidosis or comma; water retention that in patients with history of congestive heart failure may result in shortness of breath, pulmonary edema, and decompensation with resultant heart failure; weight gain; swelling or edema; medication-induced neural toxicity; particulate matter embolism and blood vessel occlusion with resultant organ, and/or nervous system infarction; and/or aseptic necrosis of one or more joints. Finally, the patient was informed that Medicine is not an exact science; therefore, there is also the possibility of unforeseen or unpredictable risks and/or possible complications that may result in a catastrophic outcome. The patient indicated having understood very clearly. We have given the patient no guarantees and we have made no promises. Enough time was given to the patient to ask questions, all of which were answered to the patient's satisfaction. Mr. Latner has indicated that he wanted to continue with the procedure. Attestation: I, the ordering provider, attest that I have discussed with the patient the benefits, risks, side-effects, alternatives, likelihood of achieving goals, and potential problems during recovery for the procedure that I have provided informed consent. Date  Time: 01/02/2022  9:57 AM  Pre-Procedure Preparation:  Monitoring: As per clinic protocol. Respiration, ETCO2, SpO2, BP, heart rate and rhythm monitor placed and checked for adequate function Safety Precautions: Patient was assessed  for positional comfort and pressure points before starting the procedure. Time-out: I initiated and conducted the "Time-out" before starting the procedure, as per protocol. The patient was asked to participate by confirming the accuracy of the "Time Out" information. Verification of the correct person, site, and procedure were performed and confirmed by me, the nursing staff, and the patient. "Time-out" conducted as per Joint Commission's Universal Protocol (UP.01.01.01). Time: 1026  Description of Procedure:  Laterality: Right Levels:  L3, L4, L5, Medial Branch Level(s), at the L3-4 and L5-S1 lumbar facet joints. Safety Precautions: Aspiration looking for blood return was conducted prior to all injections. At no point did we inject any substances, as a needle was being advanced. Before injecting, the patient was told to immediately notify me if he was experiencing any new onset of "ringing in the ears, or metallic taste in the mouth". No attempts were made at seeking any paresthesias. Safe injection practices and needle disposal techniques used. Medications properly checked for expiration dates. SDV (single dose vial) medications used. After the completion of the procedure, all disposable equipment used was discarded in the proper designated medical waste containers. Local Anesthesia: Protocol guidelines were followed. The patient was positioned over the fluoroscopy table. The area was prepped in the usual manner. The time-out was completed. The target area was identified using fluoroscopy. A 12-in long, straight, sterile hemostat was used with fluoroscopic guidance to locate the targets for each level blocked. Once located, the skin was marked with an approved surgical skin marker. Once all sites were marked, the skin (epidermis, dermis, and hypodermis), as well as deeper tissues (fat, connective tissue and muscle) were infiltrated with a small amount of a short-acting local anesthetic, loaded on a  10cc syringe with a 25G, 1.5-in  Needle. An appropriate amount of time was allowed for local anesthetics to take effect before proceeding to the next step. Technical description of process:   Radiofrequency Ablation (RFA) L3 Medial Branch Nerve RFA: The target area for the L3 medial branch is at the junction of the postero-lateral aspect of the superior articular process and the superior, posterior, and medial edge of the transverse process of L4. Under fluoroscopic guidance, a Radiofrequency needle was inserted until contact was made with os over the superior postero-lateral aspect of the pedicular shadow (target area). Sensory and motor testing was conducted to properly adjust the position of the needle. Once satisfactory placement of the needle was achieved, the numbing solution was slowly injected after negative aspiration for blood. 2.0 mL of the nerve block solution was injected without difficulty or complication. After waiting for at least 3 minutes, the ablation was performed. Once completed, the needle was removed intact. L4 Medial Branch Nerve RFA: The target area for the L4 medial branch is at the junction of the postero-lateral aspect of the superior articular process and the superior, posterior, and medial edge of the transverse process of L5. Under fluoroscopic guidance, a Radiofrequency needle was inserted until contact was made with os over the superior postero-lateral aspect of the pedicular shadow (target area). Sensory and motor testing was conducted to properly adjust the position of the needle. Once satisfactory placement of the needle was achieved, the numbing solution was slowly injected after negative aspiration for blood. 2.0 mL of the nerve block solution was injected without difficulty or complication. After waiting for at least 3 minutes, the ablation was performed. Once completed, the needle was removed intact. L5 Medial Branch Nerve RFA: The target area for the L5 medial branch is  at the junction of the postero-lateral aspect of the superior articular process of S1 and the superior, posterior, and medial edge of the sacral ala. Under fluoroscopic guidance, a Radiofrequency needle was inserted until contact was made with os over the superior postero-lateral aspect of the pedicular shadow (target area). Sensory and motor testing was conducted to properly adjust the position of the needle. Once satisfactory placement of the needle was achieved, the  numbing solution was slowly injected after negative aspiration for blood. 2.0 mL of the nerve block solution was injected without difficulty or complication. After waiting for at least 3 minutes, the ablation was performed. Once completed, the needle was removed intact  Radiofrequency lesioning (ablation):  Radiofrequency Generator: Medtronic AccurianTM AG 1000 RF Generator Sensory Stimulation Parameters: 50 Hz was used to locate & identify the nerve, making sure that the needle was positioned such that there was no sensory stimulation below 0.3 V or above 0.7 V. Motor Stimulation Parameters: 2 Hz was used to evaluate the motor component. Care was taken not to lesion any nerves that demonstrated motor stimulation of the lower extremities at an output of less than 2.5 times that of the sensory threshold, or a maximum of 2.0 V. Lesioning Technique Parameters: Standard Radiofrequency settings. (Not bipolar or pulsed.) Temperature Settings: 80 degrees C Lesioning time: 60 seconds Intra-operative Compliance: Compliant  6 cc solution made of 5 cc of 0.2% ropivacaine, 1 cc of Decadron 10 mg/cc.  2 cc injected at each level above on the right after sensorimotor testing, prior to lesioning.  Once the entire procedure was completed, the treated area was cleaned, making sure to leave some of the prepping solution back to take advantage of its long term bactericidal properties.    Illustration of the posterior view of the lumbar spine and the  posterior neural structures. Laminae of L2 through S1 are labeled. DPRL5, dorsal primary ramus of L5; DPRS1, dorsal primary ramus of S1; DPR3, dorsal primary ramus of L3; FJ, facet (zygapophyseal) joint L3-L4; I, inferior articular process of L4; LB1, lateral branch of dorsal primary ramus of L1; IAB, inferior articular branches from L3 medial branch (supplies L4-L5 facet joint); IBP, intermediate branch plexus; MB3, medial branch of dorsal primary ramus of L3; NR3, third lumbar nerve root; S, superior articular process of L5; SAB, superior articular branches from L4 (supplies L4-5 facet joint also); TP3, transverse process of L3.  Vitals:   01/02/22 1030 01/02/22 1035 01/02/22 1040 01/02/22 1045  BP: (!) 114/91 121/89 127/88 124/72  Pulse: 69 70 70 72  Resp: '15 17 16 16  '$ Temp:      TempSrc:      SpO2: 96% 96% 96% 97%  Weight:      Height:       Start Time: 1026 hrs. End Time: 1043 hrs.  Imaging Guidance (Spinal):          Type of Imaging Technique: Fluoroscopy Guidance (Spinal) Indication(s): Assistance in needle guidance and placement for procedures requiring needle placement in or near specific anatomical locations not easily accessible without such assistance. Exposure Time: Please see nurses notes. Contrast: None used. Fluoroscopic Guidance: I was personally present during the use of fluoroscopy. "Tunnel Vision Technique" used to obtain the best possible view of the target area. Parallax error corrected before commencing the procedure. "Direction-depth-direction" technique used to introduce the needle under continuous pulsed fluoroscopy. Once target was reached, antero-posterior, oblique, and lateral fluoroscopic projection used confirm needle placement in all planes. Images permanently stored in EMR. Interpretation: No contrast injected. I personally interpreted the imaging intraoperatively. Adequate needle placement confirmed in multiple planes. Permanent images saved into the patient's  record.  Post-operative Assessment:  Post-procedure Vital Signs:  Pulse/HCG Rate:  72  Temp:  (!) 96.7 F (35.9 C) Resp: 16 BP: 124/72 SpO2: 97 %  EBL: None  Complications: No immediate post-treatment complications observed by team, or reported by patient.  Note: The patient tolerated the  entire procedure well. A repeat set of vitals were taken after the procedure and the patient was kept under observation following institutional policy, for this type of procedure. Post-procedural neurological assessment was performed, showing return to baseline, prior to discharge. The patient was provided with post-procedure discharge instructions, including a section on how to identify potential problems. Should any problems arise concerning this procedure, the patient was given instructions to immediately contact us, at any time, without hesitation. In any case, we plan to contact the patient by telephone for a follow-up status report regarding this interventional procedure.  Comments:  No additional relevant information.  Plan of Care  Orders:  Orders Placed This Encounter  Procedures   DG PAIN [facility] C-ARM 1-60 MIN NO REPORT    Intraoperative interpretation by procedural physician at [facility].    Standing Status:   Standing    Number of Occurrences:   1    Order Specific Question:   Reason for exam:    Answer:   Assistance in needle guidance and placement for procedures requiring needle placement in or near specific anatomical locations not easily accessible without such assistance.   Chronic Opioid Analgesic:  Tramadol 50 mg 4 times daily as needed, quantity 120/month; MME equals 20    Medications ordered for procedure: Meds ordered this encounter  Medications   lidocaine (XYLOCAINE) 2 % (with pres) injection 400 mg   diazepam (VALIUM) tablet 5 mg    Make sure Flumazenil is available in the pyxis when using this medication. If oversedation occurs, administer 0.2 mg IV over  15 sec. If after 45 sec no response, administer 0.2 mg again over 1 min; may repeat at 1 min intervals; not to exceed 4 doses (1 mg)   dexamethasone (DECADRON) injection 10 mg   ropivacaine (PF) 2 mg/mL (0.2%) (NAROPIN) injection 9 mL   Medications administered: We administered lidocaine, diazepam, dexamethasone, and ropivacaine (PF) 2 mg/mL (0.2%).  See the medical record for exact dosing, route, and time of administration.  Follow-up plan:   Return in about 6 weeks (around 02/13/2022) for Post Procedure Evaluation, virtual.       s/p lesi #1 (left L4/5) on 8/17, #2 on 10/21/2018 (left L3/4), #3 on 12/09/2018 (Left L3/4); 02/24/2019: Right wrist injection and left trigger finger (middle finger) injection.,  Bilateral wrist injection 10/15/2019, bilateral knee Hyalgan No. 1 01/26/1960. #2 02/23/2020, #3 03/24/2020.  Endorsing approximately 75 to 80% pain relief for bilateral knee pain.  Lumbar L3-5 RFA, Left: 02/16/21, Right 03/09/21, 01/02/22        Recent Visits Date Type Provider Dept  12/07/21 Procedure visit Gillis Santa, MD [facility]  11/15/21 Office Visit Gillis Santa, MD [facility]  10/12/21 Procedure visit Gillis Santa, MD [facility]  Showing recent visits within past 90 days and meeting all other requirements Today's Visits Date Type Provider Dept  01/02/22 Procedure visit Gillis Santa, MD [facility]  Showing today's visits and meeting all other requirements Future Appointments Date Type Provider Dept  01/30/22 Appointment Gillis Santa, MD [facility]  02/07/22 Appointment Gillis Santa, MD [facility]  Showing future appointments within next 90 days and meeting all other requirements  Disposition: Discharge home  Discharge (Date  Time): 01/02/2022; 1047 hrs.   Primary Care Physician: Olin Hauser, DO Location: [facility] Note by: Gillis Santa, MD Date: 01/02/2022; Time:  11:03 AM  Disclaimer:  Medicine is not an exact science. The only guarantee  in medicine is that nothing is guaranteed. It is important to note that the decision to proceed with this intervention was based on the information collected from the patient. The Data and conclusions were drawn from the patient's questionnaire, the interview, and the physical examination. Because the information was provided in large part by the patient, it cannot be guaranteed that it has not been purposely or unconsciously manipulated. Every effort has been made to obtain as much relevant data as possible for this evaluation. It is important to note that the conclusions that lead to this procedure are derived in large part from the available data. Always take into account that the treatment will also be dependent on availability of resources and existing treatment guidelines, considered by other Pain Management Practitioners as being common knowledge and practice, at the time of the intervention. For Medico-Legal purposes, it is also important to point out that variation in procedural techniques and pharmacological choices are the acceptable norm. The indications, contraindications, technique, and results of the above procedure should only be interpreted and judged by a Board-Certified Interventional Pain Specialist with extensive familiarity and expertise in the same exact procedure and technique.

## 2022-01-02 NOTE — Progress Notes (Signed)
Safety precautions to be maintained throughout the outpatient stay will include: orient to surroundings, keep bed in low position, maintain call bell within reach at all times, provide assistance with transfer out of bed and ambulation.  

## 2022-01-02 NOTE — Patient Instructions (Signed)

## 2022-01-03 ENCOUNTER — Telehealth: Payer: Self-pay

## 2022-01-03 ENCOUNTER — Ambulatory Visit: Payer: Medicare Other

## 2022-01-03 DIAGNOSIS — G8929 Other chronic pain: Secondary | ICD-10-CM

## 2022-01-03 DIAGNOSIS — M6281 Muscle weakness (generalized): Secondary | ICD-10-CM | POA: Diagnosis not present

## 2022-01-03 DIAGNOSIS — M25511 Pain in right shoulder: Secondary | ICD-10-CM | POA: Diagnosis not present

## 2022-01-03 DIAGNOSIS — M25512 Pain in left shoulder: Secondary | ICD-10-CM | POA: Diagnosis not present

## 2022-01-03 NOTE — Telephone Encounter (Signed)
Post procedure follow up.  Patient states he is doing well 

## 2022-01-03 NOTE — Therapy (Signed)
OUTPATIENT PHYSICAL THERAPY SHOULDER EVALUATION  Patient Name: [name] MRN: [id number] DOB:[birth year], 66 y.o., male Today's Date: 01/10/2022   PT End of Session - 01/10/22 1416     Visit Number 1    Number of Visits 25    Date for PT Re-Evaluation 03/28/22    Authorization Type eval: 01/03/22    PT Start Time 1445    PT Stop Time 1530    PT Time Calculation (min) 45 min    Activity Tolerance Patient tolerated treatment well    Behavior During Therapy [facility] for tasks assessed/performed            Past Medical History:  Diagnosis Date   Anxiety    Aortic atherosclerosis ([place])    Cardiomyopathy (in setting of Afib)    a.) TTE 12/26/2013: EF 45-50%, mild ant and antsept HK. mild MR. Mod dil LA. nl RV fxn. Rhythm was Afib; b.) TTE 07/04/2019: EF 55%, mid-apical anteroseptal HK, mild MAC, mild Ao sclerosis, G2DD, RVSP 45.3   Chronic pain syndrome    a.) followed by pain management   Chronic, continuous use of opioids    a.) hydrocodone/APAP 7.5/325 mg; followed by pain management   Coronary artery disease 11/06/2014   a.) cCTA 11/06/2014: Ca score 224 (all in pLAD) -- 74th percentile for age/sex matched control   Diverticulosis    DJD (degenerative joint disease) of knee    History of hiatal hernia    History of kidney stones 2012   Hyperlipidemia    Hyperplastic colon polyp    Hypertension    Internal hemorrhoids    Intervertebral disc disorder with radiculopathy of lumbosacral region    Long term current use FULL DOSE (325 mg) aspirin    Long term current use of amiodarone    Long term current use of antithrombotics/antiplatelets    a.) rivaroxaban   Lower extremity edema    Mixed hyperlipidemia    Myalgia due to statin    Osteoarthritis    PAF (paroxysmal atrial fibrillation) ([place])    a.) CHA2DS2VASc = 4 (age, HTN, CHF, vascular disease history);  b.) s/p DCCV 03/13/2014 (200 J x1); c.) s/p DCCV 05/15/2019 (150 J x 1, 200 J x2); d.) rate/rhythm maintained on  oral amiodarone + metoprolol succinate; chronically anticoagulated with rivaroxaban   Pernicious anemia    Prostate cancer ([place])    Pulmonary nodule, right    a. 10/2014 Cardiac CTA: 71m RLL nodule; b. 04/2015 CT Chest: stable 715mRLL nodule. No new nodules; 02/2017 CTA Chest: stable, benign, 71m36mLL pulm nodule.   Sleep difficulties    a.) takes melatonin + trazodone PRN   SVT (supraventricular tachycardia)    Tubular adenoma of colon    Past Surgical History:  Procedure Laterality Date   BICEPT TENODESIS Right 09/27/2021   Procedure: Right reverse shoulder arthroplasty, biceps tenodesis;  Surgeon: PatLeim FabryD;  Location: [facility] ORS;  Service: Orthopedics;  Laterality: Right;   CARDIOVERSION N/A 05/15/2019   Procedure: CARDIOVERSION;  Surgeon: GolMinna MerrittsD;  Location: [facility];  Service: Cardiovascular;  Laterality: N/A;   CARDIOVERSION N/A 03/13/2014   Procedure: CARDIOVERSION; [facility]: TimIda RogueD   CARPAL TUNNEL RELEASE Right 12/23/2014   Procedure: CARPAL TUNNEL RELEASE;  Surgeon: HowEarnestine LeysD;  Location: [facility];  Service: Orthopedics;  Laterality: Right;   CARPAL TUNNEL RELEASE Left 01/06/2015   Procedure: CARPAL TUNNEL RELEASE;  Surgeon: HowEarnestine LeysD;  Location: [facility];  Service: Orthopedics;  Laterality: Left;   COLONOSCOPY WITH PROPOFOL N/A 10/08/2017   Procedure: COLONOSCOPY WITH PROPOFOL;  Surgeon: Lin Landsman, MD;  Location: [facility];  Service: Gastroenterology;  Laterality: N/A;   GASTRIC BYPASS  01/17/2000   KNEE SURGERY Right    knee trauma x3   prostate seeding     REVERSE SHOULDER ARTHROPLASTY Right 09/27/2021   Procedure: Right reverse shoulder arthroplasty, biceps tenodesis;  Surgeon: Leim Fabry, MD;  Location: [facility];  Service: Orthopedics;  Laterality: Right;   SHOULDER ARTHROSCOPY WITH ROTATOR CUFF REPAIR AND OPEN BICEPS TENODESIS Left 12/27/2021   Procedure: Left shoulder arthroscopic cuff repair  (supraspinatus and subscapularis) with Regeneten Patch application;  Surgeon: Leim Fabry, MD;  Location: [facility];  Service: Orthopedics;  Laterality: Left;   Patient Active Problem List   Diagnosis Date Noted   S/p reverse total shoulder arthroplasty 09/27/2021   Lesion of bone of lumbosacral spine (L5) 05/12/2021   Localized osteoarthritis of shoulder regions, bilateral 03/29/2021   Chronic pain of both shoulders 03/29/2021   Drug-induced myopathy 02/21/2021   Trigger finger of right hand 11/15/2020   Prostate cancer ([place]) 07/26/2020   Insomnia 08/01/2019   Primary osteoarthritis of both wrists 02/17/2019   Trigger middle finger of left hand 02/17/2019   Chronic pain syndrome 02/17/2019   Chronic radicular lumbar pain 10/08/2018   Lumbar radiculopathy 10/08/2018   Lumbar degenerative disc disease 10/08/2018   Lumbar facet arthropathy 10/08/2018   Lumbar facet joint syndrome 10/08/2018   Intervertebral disc disorder with radiculopathy of lumbosacral region    Pernicious anemia 05/22/2016   Paroxysmal atrial fibrillation ([place]) 12/20/2015   DJD (degenerative joint disease) of knee 10/27/2014   Bilateral carpal tunnel syndrome 10/27/2014   Morbid obesity with BMI of 50.0-59.9, adult ([place]) 10/27/2014   Mixed hyperlipidemia 10/27/2014   H/O gastric bypass 03/20/2014   Hyperkalemia 03/20/2014   History of prostate cancer 12/26/2013   Essential hypertension 12/26/2013   Encounter for anticoagulation discussion and counseling 12/26/2013    PCP: Olin Hauser, DO  REFERRING PROVIDER: Leim Fabry, MD  REFERRING DIAGNOSIS: Incomplete tear of left rotator cuff, unspecified whether traumatic M75.112   THERAPY DIAG: Chronic left shoulder pain  Muscle weakness (generalized)  RATIONALE FOR EVALUATION AND TREATMENT: Rehabilitation  ONSET DATE: Surgery 12/27/21, Chronic L shoulder pain for multiple years  FOLLOW UP APPT WITH PROVIDER: Yes    SUBJECTIVE:                                                                                                                                                                                          Chief Complaint: S/p L shoulder RTC  repair, biceps tenodesis, and shoulder debridement   Pertinent History Pt with history of chronic bilateral shoulder pain. He underwent right reverse TSR and right biceps tenodesis on 09/27/21. He experienced post-operative instability in R shoulder however it improved and he was able to avoid a revision. On 12/27/21 he underwent left arthroscopic rotator cuff repair (subscapularis (full-thickness), supraspinatus (partial-thickness) side-to-side repair + Regeneten patch), left arthroscopic biceps tenodesis, and left extensive debridement of shoulder (glenohumeral and subacromial spaces). Per surgical note other intraoperative findings include Type 2 SLAP tear, degenerative anterior labral tearing, grade 2 degenerative changes to articular cartilage of humeral head, and significant posterior capsule synovitis. No significant post-operative complications. He arrives without abduction pillow and states that Dr. Posey Pronto advised him he doesn't have to wear the abduction pillow. He got behind on his pain medication today and states that today his shoulder has been the most painful he has experienced since surgery. He also noticed swelling in L lateral upper arm this morning. He did take his pain medication at 14:00, before PT evaluation.  He has a history of chronic back pain and underwent a lumbar facet, medial branch radiofrequency ablation on 01/02/22. At the time of the evaluation pt has not yet appreciated improvement in his back pain from this procedure. He has a past medical history of Cardiomyopathy (in setting of Afib), DJD (degenerative joint disease) of knee, History of kidney stones, Intervertebral disc disorder with radiculopathy of lumbosacral region, Kidney stone (2012), Lower extremity edema, PAF  (paroxysmal atrial fibrillation) ([place]), Pernicious anemia, Prostate cancer ([place]), Pulmonary nodule, right, and SVT (supraventricular tachycardia) ([place]). He also  has a past surgical history that includes Knee surgery; Gastric bypass (2002); prostate seeding; Carpal tunnel release (Right, 12/23/2014); Carpal tunnel release (Left, 01/06/2015); Colonoscopy with propofol (N/A, 10/08/2017); and Cardioversion (N/A, 05/15/2019).   Pain:  Pain Intensity: Present: 5/10, Best: 3/10, Worst: 10/10 Pain location: Anterior L shoulder Pain Quality: aching and throbbing Radiating: No  Numbness/Tingling: No Focal Weakness: Yes History of prior shoulder or neck/shoulder injury, pain, surgery, or therapy: Yes, chronic bilateral shoulder pain with R TSR. 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 No AROM or WB through LUE (per protocol), Passive elevation and ER in staged ROM to begin at week 4 (elevation 90 degrees weeks 4-6 with no ER; 0-120 weeks 6-8 with ER 0-30 degrees). No pulleys or cane until after 6 weeks, recommend no driving x 6-8 weeks.   Weight Bearing Restrictions: Yes No WB through LUE for at least 6 weeks  Living Environment Lives with: lives with their spouse Lives in: House/apartment  Patient Goals: Improve L shoulder strength and pain. Pt would like to be able to play with/lift his grandson as well as take care of his property.   OBJECTIVE:   Patient Surveys  FOTO: To be completed QuickDASH:  47.7%  Cognition Patient is oriented to person, place, and time.  Recent memory is intact.  Remote memory is intact.  Attention span and concentration are intact.  Expressive speech is intact.  Patient's fund of knowledge is within normal limits for educational level.    Johney Maine  Musculoskeletal Assessment Tremor: None Bulk: Mild swelling noted in L lateral upper arm. Pt denies chills, fevers, redness, or dischrage from inicisions Tone: Normal  Gait Deferred  Posture Forward head and rounded shoulders  Cervical Screen Deferred  AROM Full L wrist AROM flexion, extension, radial deviation, and ulnar deviation. MCP flexion and extension WNL. Unable to assess L shoulder and elbow AROM per protocol  PROM Full L elbow PROM flexion and extension however during first attempt at slow passive extension pt reports sudden pain in L shoulder which eventually improve after a couple minutes of rest. Full L elbow PROM pronation/supination.   LE MMT: Full L wrist strength for flexion, extension, radial deviation, and ulnar deviation. L grip strength is intact and strong. Deferred testing of L elbow and shoulder strength per protocol  Sensation Grossly intact to light touch throughout LUE as determined by testing dermatomes C2-T2. Proprioception and hot/cold testing deferred on this date.  Reflexes Deferred  Palpation Pt is generally tender to light palpation around anterior, lateral, and posterior L shoulder;  Repeated Movements Deferred  Passive Accessory Intervertebral Motion Deferred  Special Testing Deferred   TODAY'S TREATMENT  None   PATIENT EDUCATION:  Education details: Plan of care, protocol Person educated: Patient Education method: Explanation Education comprehension: verbalized understanding   HOME EXERCISE PROGRAM: Access Code: [email or web address] URL: [email or web address] Date: 01/10/2022 Prepared by: Roxana Hires  Exercises - Wrist Flexion AROM  -  2 x daily - 7 x weekly - 2 sets - 10 reps - 3s hold - Wrist Extension AROM  - 2 x daily - 7 x weekly - 2 sets - 10 reps - 3s hold - Seated Scapular Retraction  - 2 x daily - 7 x weekly - 2 sets - 10 reps - 3s hold - Supported Elbow Flexion Extension PROM  - 2 x daily - 7 x weekly - 2 sets - 10 reps - 3s hold - Forearm Supination PROM (Mirrored)  - 2 x daily - 7 x weekly - 2 sets - 10 reps - 3s hold - Forearm Pronation PROM (Mirrored)  - 2 x daily - 7 x weekly - 2 sets - 10 reps - 3s hold - Seated Gripping Towel  - 2 x daily - 7 x weekly - 2 sets - 10 reps - 3s hold   ASSESSMENT:  CLINICAL IMPRESSION: Patient is a 66 y.o. male who was seen today for physical therapy evaluation and treatment s/p L shoulder RTC repair (large), biceps tenodesis, and debridement. Pt reporting worse pain this morning and during evaluation having missed his AM pain medication. He is in the protection phase of his protocol so performed PROM L elbow flexion, extension, pronation, and supination. Also performed L wrist AROM flexion and extension. Pt issued HEP and encouraged compliance with sling. Objective impairments include decreased ROM, decreased strength, and pain. These impairments are limiting patient from meal prep, cleaning, laundry, driving, shopping, community activity, and yard work. Personal factors including Past/current experiences, Time since onset of injury/illness/exacerbation, and 3+ comorbidities: anxiety, chronic pain, OA, and PAF  are also affecting patient's functional outcome. Patient will benefit from skilled PT to address above impairments and improve overall function.  REHAB POTENTIAL: Good  CLINICAL DECISION MAKING: Evolving/moderate complexity  EVALUATION COMPLEXITY: Moderate   GOALS: Goals reviewed with patient? Yes  SHORT TERM GOALS: Target date: 02/14/22  Pt will be independent with HEP to improve strength and decrease right shoulder pain to improve pain-free function at home and  work. Baseline:  Goal status: INITIAL   LONG TERM GOALS: Target date: 03/28/22  Pt will increase L shoulder FOTO to at least pre-determined value to demonstrate significant improvement in function at home and with leisure activities related to L shoulder. Baseline: 01/03/22: To be completed Goal status: INITIAL  2.  Pt will decrease worst L shoulder pain by at least 3 points on the NPRS in order to demonstrate clinically significant reduction in R shoulder pain. Baseline: 01/03/22: worst: 10/10 Goal status: INITIAL  3.  Pt will decrease quick DASH score by at least 8% in order to demonstrate clinically significant reduction in disability related to L shoulder pain        Baseline: 01/03/22: 47.7% Goal status: INITIAL  4. Pt will demonstrate L shoulder flexion, abduction, IR, and ER strength of at least 4/5 in order to demonstrate improvement in strength and function         Baseline: 01/03/22: Unable to test per protocol Goal status: INITIAL   PLAN: PT FREQUENCY: 1-2x/week  PT DURATION: 12 weeks  PLANNED INTERVENTIONS: Therapeutic exercises, Therapeutic activity, Neuromuscular re-education, Balance training, Gait training, Patient/Family education, Joint manipulation, Joint mobilization, Vestibular training, Canalith repositioning, Aquatic Therapy, Dry Needling, Electrical stimulation, Spinal manipulation, Spinal mobilization, Cryotherapy, Moist heat, Traction, Ultrasound, Ionotophoresis 29m/ml Dexamethasone, and Manual therapy  PLAN FOR NEXT SESSION: Have pt complete FOTO, Progress L shoulder ROM and strength per protocol  JLyndel SafeHuprich PT, DPT, GCS  Mung Rinker 01/10/2022, 5:52 PM

## 2022-01-12 ENCOUNTER — Ambulatory Visit: Payer: Medicare Other

## 2022-01-12 DIAGNOSIS — M6281 Muscle weakness (generalized): Secondary | ICD-10-CM

## 2022-01-12 DIAGNOSIS — G8929 Other chronic pain: Secondary | ICD-10-CM | POA: Diagnosis not present

## 2022-01-12 DIAGNOSIS — M25511 Pain in right shoulder: Secondary | ICD-10-CM | POA: Diagnosis not present

## 2022-01-12 DIAGNOSIS — M25512 Pain in left shoulder: Secondary | ICD-10-CM | POA: Diagnosis not present

## 2022-01-12 NOTE — Therapy (Signed)
OUTPATIENT PHYSICAL THERAPY SHOULDER TREATMENT  Patient Name: ULISES WOLFINGER MRN: [id number] DOB:[birth year], 66 y.o., male Today's Date: 01/13/2022   PT End of Session - 01/12/22 1608     Visit Number 2    Number of Visits 25    Date for PT Re-Evaluation 03/28/22    Authorization Type eval: 01/03/22    PT Start Time 1615    PT Stop Time 1700    PT Time Calculation (min) 45 min    Activity Tolerance Patient tolerated treatment well    Behavior During Therapy [facility] for tasks assessed/performed            Past Medical History:  Diagnosis Date   Anxiety    Aortic atherosclerosis ([place])    Cardiomyopathy (in setting of Afib)    a.) TTE 12/26/2013: EF 45-50%, mild ant and antsept HK. mild MR. Mod dil LA. nl RV fxn. Rhythm was Afib; b.) TTE 07/04/2019: EF 55%, mid-apical anteroseptal HK, mild MAC, mild Ao sclerosis, G2DD, RVSP 45.3   Chronic pain syndrome    a.) followed by pain management   Chronic, continuous use of opioids    a.) hydrocodone/APAP 7.5/325 mg; followed by pain management   Coronary artery disease 11/06/2014   a.) cCTA 11/06/2014: Ca score 224 (all in pLAD) -- 74th percentile for age/sex matched control   Diverticulosis    DJD (degenerative joint disease) of knee    History of hiatal hernia    History of kidney stones 2012   Hyperlipidemia    Hyperplastic colon polyp    Hypertension    Internal hemorrhoids    Intervertebral disc disorder with radiculopathy of lumbosacral region    Long term current use FULL DOSE (325 mg) aspirin    Long term current use of amiodarone    Long term current use of antithrombotics/antiplatelets    a.) rivaroxaban   Lower extremity edema    Mixed hyperlipidemia    Myalgia due to statin    Osteoarthritis    PAF (paroxysmal atrial fibrillation) ([place])    a.) CHA2DS2VASc = 4 (age, HTN, CHF, vascular disease history);  b.) s/p DCCV 03/13/2014 (200 J x1); c.) s/p DCCV 05/15/2019 (150 J x 1, 200 J x2); d.) rate/rhythm maintained on oral  amiodarone + metoprolol succinate; chronically anticoagulated with rivaroxaban   Pernicious anemia    Prostate cancer ([place])    Pulmonary nodule, right    a. 10/2014 Cardiac CTA: 61m RLL nodule; b. 04/2015 CT Chest: stable 720mRLL nodule. No new nodules; 02/2017 CTA Chest: stable, benign, 22m102mLL pulm nodule.   Sleep difficulties    a.) takes melatonin + trazodone PRN   SVT (supraventricular tachycardia)    Tubular adenoma of colon    Past Surgical History:  Procedure Laterality Date   BICEPT TENODESIS Right 09/27/2021   Procedure: Right reverse shoulder arthroplasty, biceps tenodesis;  Surgeon: PatLeim FabryD;  Location: [facility] ORS;  Service: Orthopedics;  Laterality: Right;   CARDIOVERSION N/A 05/15/2019   Procedure: CARDIOVERSION;  Surgeon: GolMinna MerrittsD;  Location: [facility] OR[facility];  Service: Cardiovascular;  Laterality: N/A;   CARDIOVERSION N/A 03/13/2014   Procedure: CARDIOVERSION; [facility] TimIda RogueD   CARPAL TUNNEL RELEASE Right 12/23/2014   Procedure: CARPAL TUNNEL RELEASE;  Surgeon: HowEarnestine LeysD;  Location: [facility] ORS;  Service: Orthopedics;  Laterality: Right;   CARPAL TUNNEL RELEASE Left 01/06/2015   Procedure: CARPAL TUNNEL RELEASE;  Surgeon: HowEarnestine LeysD;  Location: [facility] ORS;  Service: Orthopedics;  Laterality: Left;   COLONOSCOPY WITH PROPOFOL N/A 10/08/2017   Procedure: COLONOSCOPY WITH PROPOFOL;  Surgeon: Lin Landsman, MD;  Location: [facility] ENDOSCOPY;  Service: Gastroenterology;  Laterality: N/A;   GASTRIC BYPASS  01/17/2000   KNEE SURGERY Right    knee trauma x3   prostate seeding     REVERSE SHOULDER ARTHROPLASTY Right 09/27/2021   Procedure: Right reverse shoulder arthroplasty, biceps tenodesis;  Surgeon: Leim Fabry, MD;  Location: [facility] ORS;  Service: Orthopedics;  Laterality: Right;   SHOULDER ARTHROSCOPY WITH ROTATOR CUFF REPAIR AND OPEN BICEPS TENODESIS Left 12/27/2021   Procedure: Left shoulder arthroscopic cuff repair  (supraspinatus and subscapularis) with Regeneten Patch application;  Surgeon: Leim Fabry, MD;  Location: [facility] ORS;  Service: Orthopedics;  Laterality: Left;   Patient Active Problem List   Diagnosis Date Noted   S/p reverse total shoulder arthroplasty 09/27/2021   Lesion of bone of lumbosacral spine (L5) 05/12/2021   Localized osteoarthritis of shoulder regions, bilateral 03/29/2021   Chronic pain of both shoulders 03/29/2021   Drug-induced myopathy 02/21/2021   Trigger finger of right hand 11/15/2020   Prostate cancer ([place]) 07/26/2020   Insomnia 08/01/2019   Primary osteoarthritis of both wrists 02/17/2019   Trigger middle finger of left hand 02/17/2019   Chronic pain syndrome 02/17/2019   Chronic radicular lumbar pain 10/08/2018   Lumbar radiculopathy 10/08/2018   Lumbar degenerative disc disease 10/08/2018   Lumbar facet arthropathy 10/08/2018   Lumbar facet joint syndrome 10/08/2018   Intervertebral disc disorder with radiculopathy of lumbosacral region    Pernicious anemia 05/22/2016   Paroxysmal atrial fibrillation ([place]) 12/20/2015   DJD (degenerative joint disease) of knee 10/27/2014   Bilateral carpal tunnel syndrome 10/27/2014   Morbid obesity with BMI of 50.0-59.9, adult ([place]) 10/27/2014   Mixed hyperlipidemia 10/27/2014   H/O gastric bypass 03/20/2014   Hyperkalemia 03/20/2014   History of prostate cancer 12/26/2013   Essential hypertension 12/26/2013   Encounter for anticoagulation discussion and counseling 12/26/2013    PCP: Olin Hauser, DO  REFERRING PROVIDER: Leim Fabry, MD  REFERRING DIAGNOSIS: Incomplete tear of left rotator cuff, unspecified whether traumatic M75.112   THERAPY DIAG: Chronic left shoulder pain  Muscle weakness (generalized)  RATIONALE FOR EVALUATION AND TREATMENT: Rehabilitation  ONSET DATE: Surgery 12/27/21, Chronic L shoulder pain for multiple years  FOLLOW UP APPT WITH PROVIDER: Yes    FROM INITIAL  EVALUATION SUBJECTIVE:                                                                                                                                                                                         Chief Complaint: S/p  L shoulder RTC repair, biceps tenodesis, and shoulder debridement  Pertinent History Pt with history of chronic bilateral shoulder pain. He underwent right reverse TSR and right biceps tenodesis on 09/27/21. He experienced post-operative instability in R shoulder however it improved and he was able to avoid a revision. On 12/27/21 he underwent left arthroscopic rotator cuff repair (subscapularis (full-thickness), supraspinatus (partial-thickness) side-to-side repair + Regeneten patch), left arthroscopic biceps tenodesis, and left extensive debridement of shoulder (glenohumeral and subacromial spaces). Per surgical note other intraoperative findings include Type 2 SLAP tear, degenerative anterior labral tearing, grade 2 degenerative changes to articular cartilage of humeral head, and significant posterior capsule synovitis. No significant post-operative complications. He arrives without abduction pillow and states that Dr. Posey Pronto advised him he doesn't have to wear the abduction pillow. He got behind on his pain medication today and states that today his shoulder has been the most painful he has experienced since surgery. He also noticed swelling in L lateral upper arm this morning. He did take his pain medication at 14:00, before PT evaluation.  He has a history of chronic back pain and underwent a lumbar facet, medial branch radiofrequency ablation on 01/02/22. At the time of the evaluation pt has not yet appreciated improvement in his back pain from this procedure. He has a past medical history of Cardiomyopathy (in setting of Afib), DJD (degenerative joint disease) of knee, History of kidney stones, Intervertebral disc disorder with radiculopathy of lumbosacral region, Kidney stone  (2012), Lower extremity edema, PAF (paroxysmal atrial fibrillation) ([place]), Pernicious anemia, Prostate cancer ([place]), Pulmonary nodule, right, and SVT (supraventricular tachycardia) ([place]). He also  has a past surgical history that includes Knee surgery; Gastric bypass (2002); prostate seeding; Carpal tunnel release (Right, 12/23/2014); Carpal tunnel release (Left, 01/06/2015); Colonoscopy with propofol (N/A, 10/08/2017); and Cardioversion (N/A, 05/15/2019).   Pain:  Pain Intensity: Present: 5/10, Best: 3/10, Worst: 10/10 Pain location: Anterior L shoulder Pain Quality: aching and throbbing Radiating: No  Numbness/Tingling: No Focal Weakness: Yes History of prior shoulder or neck/shoulder injury, pain, surgery, or therapy: Yes, chronic bilateral shoulder pain with R TSR. 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 No AROM or WB through LUE (per protocol), Passive elevation and ER in staged ROM to begin at week 4 (elevation 90 degrees weeks 4-6 with no ER; 0-120 weeks 6-8 with ER 0-30 degrees). No pulleys or cane until after 6 weeks, recommend no driving x 6-8 weeks.   Weight Bearing Restrictions: Yes No WB through LUE for at least 6 weeks  Living Environment Lives with: lives with their spouse Lives in: House/apartment  Patient Goals: Improve L shoulder strength and pain. Pt would like to be able to play with/lift his grandson as well as take care of his property.   OBJECTIVE:   Patient Surveys   FOTO: To be completed QuickDASH: 47.7%  Cognition Patient is oriented to person, place, and time.  Recent memory is intact.  Remote memory is intact.  Attention span and concentration are intact.  Expressive speech is intact.  Patient's fund of knowledge is within normal limits for educational level.  Gross Musculoskeletal Assessment Tremor: None Bulk: Mild swelling noted in L lateral upper arm. Pt denies chills, fevers, redness, or dischrage from inicisions Tone: Normal  Gait Deferred  Posture Forward head and rounded shoulders  Cervical Screen Deferred  AROM Full L wrist AROM flexion, extension, radial deviation, and ulnar deviation. MCP flexion and extension WNL. Unable to assess L shoulder and elbow AROM per protocol  PROM Full L elbow PROM flexion and extension however during first attempt at slow passive extension pt reports sudden pain in L shoulder which eventually improve after a couple minutes of rest. Full L elbow PROM pronation/supination.   LE MMT: Full L wrist strength for flexion, extension, radial deviation, and ulnar deviation. L grip strength is intact and strong. Deferred testing of L elbow and shoulder strength per protocol  Sensation Grossly intact to light touch throughout LUE as determined by testing dermatomes C2-T2. Proprioception and hot/cold testing deferred on this date.  Reflexes Deferred  Palpation Pt is generally tender to light palpation around anterior, lateral, and posterior L shoulder;  Repeated Movements Deferred  Passive Accessory Intervertebral Motion Deferred  Special Testing Deferred   TODAY'S TREATMENT   SUBJECTIVE: Pt reports that he has been having more L shoulder pain consistently since the initial evaluation. He has also been having some R shoulder pain which he feels is related to overuse. He did have one episode of R shoulder instability where he felt like his R shoulder possibly subluxed but within about 15  minutes he felt it reduce. Pt states he has had a lot of fatigue recently and feels like his heart is in atrial fibrillation still.   PAIN: L shoulder pain   Ther-ex  Pre-acitivty vitals: HR: 72 bpm (30s count of radial pulse), BP: 125/74, SpO2%: 98%, pulse is irregularly irregular by palpation and auscultation   All exercises performed in semirecumbent position: L wrist AROM flexion, extension, radial deviation, and ulnar deviation against gravity 2 x 10 each direction; L hand composite gripping with putty while performing RUE exercises; L digits 1-5 individual flexion with putty while performing RUE exercises; R shoulder flexion from neutral to 90 degrees with 2# dumbbell x 10 (initially attempted 3# dumbbell but too much strain on R shoulder); R shoulder serratus punch with 2# dumbbell x 10; R shoulder circles with 2# dumbbell CW/CCW x 10 each direction; R elbow flexion with 2# dumbbell x 10; R elbow extension with manual resistance from therapist x 10;   Manual Therapy  L elbow PROM flexion/extension x multiple bouts; L elbow PROM pronation/supination x multiple bouts; Reinforced L shoulder precautions;   PATIENT EDUCATION:  Education details: L shoulder precautions, Pt educated throughout session about proper posture and technique with exercises. Improved exercise technique, movement at target joints, use of target muscles after min to mod verbal, visual, tactile cues.  Person educated: Patient Education method: Explanation Education comprehension: verbalized understanding and returned demonstration   HOME EXERCISE PROGRAM: Access Code: [email or web address] URL: [email or web address] Date: 01/10/2022 Prepared by: Roxana Hires  Exercises - Wrist Flexion AROM  - 2 x daily - 7 x weekly - 2 sets - 10 reps - 3s hold - Wrist Extension AROM  - 2 x daily - 7 x weekly - 2 sets - 10 reps - 3s hold - Seated Scapular Retraction  - 2 x daily - 7 x weekly - 2 sets - 10 reps - 3s  hold - Supported Elbow Flexion Extension PROM  - 2 x daily - 7 x weekly - 2  sets - 10 reps - 3s hold - Forearm Supination PROM (Mirrored)  - 2 x daily - 7 x weekly - 2 sets - 10 reps - 3s hold - Forearm Pronation PROM (Mirrored)  - 2 x daily - 7 x weekly - 2 sets - 10 reps - 3s hold - Seated Gripping Towel  - 2 x daily - 7 x weekly - 2 sets - 10 reps - 3s hold   ASSESSMENT:  CLINICAL IMPRESSION: Patient reports that he feels like his heart is still in atrial fibrillation. Vitals obtained prior to exercise and his heart rate is 72 bpm (30s count of radial pulse) but his pulse is irregularly irregular by palpation and auscultation. Pt encouraged to contact his MD. He is already on anticoagulation. Continued with PROM for L elbow and AROM for L wrist/hand. Continued with R shoulder strengthening as well during session. Reinforced L shoulder precautions however pt still appears to be using his LUE actively when both in and out of the sling. Pt encouraged to continue HEP and follow-up as scheduled. He will benefit from skilled PT to address deficits in range of motion and strength in order to improve overall function at home and with leisure activities such as caring for his property.   REHAB POTENTIAL: Good  CLINICAL DECISION MAKING: Evolving/moderate complexity  EVALUATION COMPLEXITY: Moderate   GOALS: Goals reviewed with patient? Yes  SHORT TERM GOALS: Target date: 02/14/22  Pt will be independent with HEP to improve strength and decrease right shoulder pain to improve pain-free function at home and work. Baseline:  Goal status: INITIAL   LONG TERM GOALS: Target date: 03/28/22  Pt will increase L shoulder FOTO to at least pre-determined value to demonstrate significant improvement in function at home and with leisure activities related to L shoulder. Baseline: 01/03/22: To be completed Goal status: INITIAL  2.  Pt will decrease worst L shoulder pain by at least 3 points on the NPRS in  order to demonstrate clinically significant reduction in R shoulder pain. Baseline: 01/03/22: worst: 10/10 Goal status: INITIAL  3.  Pt will decrease quick DASH score by at least 8% in order to demonstrate clinically significant reduction in disability related to L shoulder pain        Baseline: 01/03/22: 47.7% Goal status: INITIAL  4. Pt will demonstrate L shoulder flexion, abduction, IR, and ER strength of at least 4/5 in order to demonstrate improvement in strength and function       Baseline: 01/03/22: Unable to test per protocol Goal status: INITIAL   PLAN: PT FREQUENCY: 1-2x/week  PT DURATION: 12 weeks  PLANNED INTERVENTIONS: Therapeutic exercises, Therapeutic activity, Neuromuscular re-education, Balance training, Gait training, Patient/Family education, Joint manipulation, Joint mobilization, Vestibular training, Canalith repositioning, Aquatic Therapy, Dry Needling, Electrical stimulation, Spinal manipulation, Spinal mobilization, Cryotherapy, Moist heat, Traction, Ultrasound, Ionotophoresis 65m/ml Dexamethasone, and Manual therapy  PLAN FOR NEXT SESSION: Have pt complete FOTO, Progress L shoulder ROM and strength per protocol  JLyndel SafeHuprich PT, DPT, GCS  Kiira Brach 01/13/2022, 9:59 AM

## 2022-01-15 ENCOUNTER — Other Ambulatory Visit: Payer: Self-pay | Admitting: Cardiovascular Disease

## 2022-01-17 ENCOUNTER — Ambulatory Visit: Payer: Medicare Other | Attending: Orthopedic Surgery | Admitting: Physical Therapy

## 2022-01-17 DIAGNOSIS — R293 Abnormal posture: Secondary | ICD-10-CM | POA: Diagnosis not present

## 2022-01-17 DIAGNOSIS — M5459 Other low back pain: Secondary | ICD-10-CM | POA: Insufficient documentation

## 2022-01-17 DIAGNOSIS — M25511 Pain in right shoulder: Secondary | ICD-10-CM | POA: Diagnosis not present

## 2022-01-17 DIAGNOSIS — M25512 Pain in left shoulder: Secondary | ICD-10-CM | POA: Insufficient documentation

## 2022-01-17 DIAGNOSIS — M6281 Muscle weakness (generalized): Secondary | ICD-10-CM | POA: Insufficient documentation

## 2022-01-17 DIAGNOSIS — G8929 Other chronic pain: Secondary | ICD-10-CM | POA: Diagnosis not present

## 2022-01-17 NOTE — Telephone Encounter (Signed)
Prescription refill request for Xarelto received.   Indication: afib  Last office visit: 11/01/2021, [place]  Weight: 143.3kg Age: 67 yo  Scr: 0.96, 09/28/2021 CrCl: 149 ml/min   Refill sent.

## 2022-01-17 NOTE — Therapy (Signed)
OUTPATIENT PHYSICAL THERAPY SHOULDER TREATMENT  Patient Name: [name] MRN: [id number] DOB:July 01, 1955, 67 y.o., male Today's Date: 01/17/2022   PT End of Session - 01/17/22 1605     Visit Number 3    Number of Visits 25    Date for PT Re-Evaluation 03/28/22    Authorization Type eval: 01/03/22    PT Start Time 1605    PT Stop Time 1647    PT Time Calculation (min) 42 min    Activity Tolerance Patient tolerated treatment well    Behavior During Therapy WFL for tasks assessed/performed            Past Medical History:  Diagnosis Date   Anxiety    Aortic atherosclerosis ([place])    Cardiomyopathy (in setting of Afib)    a.) TTE 12/26/2013: EF 45-50%, mild ant and antsept HK. mild MR. Mod dil LA. nl RV fxn. Rhythm was Afib; b.) TTE 07/04/2019: EF 55%, mid-apical anteroseptal HK, mild MAC, mild Ao sclerosis, G2DD, RVSP 45.3   Chronic pain syndrome    a.) followed by pain management   Chronic, continuous use of opioids    a.) hydrocodone/APAP 7.5/325 mg; followed by pain management   Coronary artery disease 11/06/2014   a.) cCTA 11/06/2014: Ca score 224 (all in pLAD) -- 74th percentile for age/sex matched control   Diverticulosis    DJD (degenerative joint disease) of knee    History of hiatal hernia    History of kidney stones 2012   Hyperlipidemia    Hyperplastic colon polyp    Hypertension    Internal hemorrhoids    Intervertebral disc disorder with radiculopathy of lumbosacral region    Long term current use FULL DOSE (325 mg) aspirin    Long term current use of amiodarone    Long term current use of antithrombotics/antiplatelets    a.) rivaroxaban   Lower extremity edema    Mixed hyperlipidemia    Myalgia due to statin    Osteoarthritis    PAF (paroxysmal atrial fibrillation) ([place])    a.) CHA2DS2VASc = 4 (age, HTN, CHF, vascular disease history);  b.) s/p DCCV 03/13/2014 (200 J x1); c.) s/p DCCV 05/15/2019 (150 J x 1, 200 J x2); d.) rate/rhythm maintained on oral  amiodarone + metoprolol succinate; chronically anticoagulated with rivaroxaban   Pernicious anemia    Prostate cancer ([place])    Pulmonary nodule, right    a. 10/2014 Cardiac CTA: 39m RLL nodule; b. 04/2015 CT Chest: stable 773mRLL nodule. No new nodules; 02/2017 CTA Chest: stable, benign, 3m2mLL pulm nodule.   Sleep difficulties    a.) takes melatonin + trazodone PRN   SVT (supraventricular tachycardia)    Tubular adenoma of colon    Past Surgical History:  Procedure Laterality Date   BICEPT TENODESIS Right 09/27/2021   Procedure: Right reverse shoulder arthroplasty, biceps tenodesis;  Surgeon: PatLeim FabryD;  Location: [facility];  Service: Orthopedics;  Laterality: Right;   CARDIOVERSION N/A 05/15/2019   Procedure: CARDIOVERSION;  Surgeon: GolMinna MerrittsD;  Location: [facility];  Service: Cardiovascular;  Laterality: N/A;   CARDIOVERSION N/A 03/13/2014   Procedure: CARDIOVERSION; [facility] TimIda RogueD   CARPAL TUNNEL RELEASE Right 12/23/2014   Procedure: CARPAL TUNNEL RELEASE;  Surgeon: HowEarnestine LeysD;  Location: [facility];  Service: Orthopedics;  Laterality: Right;   CARPAL TUNNEL RELEASE Left 01/06/2015   Procedure: CARPAL TUNNEL RELEASE;  Surgeon: HowEarnestine LeysD;  Location: [facility];  Service: Orthopedics;  Laterality: Left;   COLONOSCOPY WITH PROPOFOL N/A 10/08/2017   Procedure: COLONOSCOPY WITH PROPOFOL;  Surgeon: Lin Landsman, MD;  Location: [facility];  Service: Gastroenterology;  Laterality: N/A;   GASTRIC BYPASS  01/17/2000   KNEE SURGERY Right    knee trauma x3   prostate seeding     REVERSE SHOULDER ARTHROPLASTY Right 09/27/2021   Procedure: Right reverse shoulder arthroplasty, biceps tenodesis;  Surgeon: Leim Fabry, MD;  Location: [facility];  Service: Orthopedics;  Laterality: Right;   SHOULDER ARTHROSCOPY WITH ROTATOR CUFF REPAIR AND OPEN BICEPS TENODESIS Left 12/27/2021   Procedure: Left shoulder arthroscopic cuff repair  (supraspinatus and subscapularis) with Regeneten Patch application;  Surgeon: Leim Fabry, MD;  Location: [facility];  Service: Orthopedics;  Laterality: Left;   Patient Active Problem List   Diagnosis Date Noted   S/p reverse total shoulder arthroplasty 09/27/2021   Lesion of bone of lumbosacral spine (L5) 05/12/2021   Localized osteoarthritis of shoulder regions, bilateral 03/29/2021   Chronic pain of both shoulders 03/29/2021   Drug-induced myopathy 02/21/2021   Trigger finger of right hand 11/15/2020   Prostate cancer ([place]) 07/26/2020   Insomnia 08/01/2019   Primary osteoarthritis of both wrists 02/17/2019   Trigger middle finger of left hand 02/17/2019   Chronic pain syndrome 02/17/2019   Chronic radicular lumbar pain 10/08/2018   Lumbar radiculopathy 10/08/2018   Lumbar degenerative disc disease 10/08/2018   Lumbar facet arthropathy 10/08/2018   Lumbar facet joint syndrome 10/08/2018   Intervertebral disc disorder with radiculopathy of lumbosacral region    Pernicious anemia 05/22/2016   Paroxysmal atrial fibrillation ([place]) 12/20/2015   DJD (degenerative joint disease) of knee 10/27/2014   Bilateral carpal tunnel syndrome 10/27/2014   Morbid obesity with BMI of 50.0-59.9, adult ([place]) 10/27/2014   Mixed hyperlipidemia 10/27/2014   H/O gastric bypass 03/20/2014   Hyperkalemia 03/20/2014   History of prostate cancer 12/26/2013   Essential hypertension 12/26/2013   Encounter for anticoagulation discussion and counseling 12/26/2013    PCP: Olin Hauser, DO  REFERRING PROVIDER: Leim Fabry, MD  REFERRING DIAGNOSIS: Incomplete tear of left rotator cuff, unspecified whether traumatic M75.112   THERAPY DIAG: Chronic left shoulder pain  Muscle weakness (generalized)  Chronic right shoulder pain  Other low back pain  Abnormal posture  RATIONALE FOR EVALUATION AND TREATMENT: Rehabilitation  ONSET DATE: Surgery 12/27/21, Chronic L shoulder pain for multiple  years  FOLLOW UP APPT WITH PROVIDER: Yes    FROM INITIAL EVALUATION SUBJECTIVE:  Chief Complaint: S/p L shoulder RTC repair, biceps tenodesis, and shoulder debridement  Pertinent History Pt with history of chronic bilateral shoulder pain. He underwent right reverse TSR and right biceps tenodesis on 09/27/21. He experienced post-operative instability in R shoulder however it improved and he was able to avoid a revision. On 12/27/21 he underwent left arthroscopic rotator cuff repair (subscapularis (full-thickness), supraspinatus (partial-thickness) side-to-side repair + Regeneten patch), left arthroscopic biceps tenodesis, and left extensive debridement of shoulder (glenohumeral and subacromial spaces). Per surgical note other intraoperative findings include Type 2 SLAP tear, degenerative anterior labral tearing, grade 2 degenerative changes to articular cartilage of humeral head, and significant posterior capsule synovitis. No significant post-operative complications. He arrives without abduction pillow and states that Dr. Posey Pronto advised him he doesn't have to wear the abduction pillow. He got behind on his pain medication today and states that today his shoulder has been the most painful he has experienced since surgery. He also noticed swelling in L lateral upper arm this morning. He did take his pain medication at 14:00, before PT evaluation.  He has a history of chronic back pain and underwent a lumbar facet, medial branch radiofrequency ablation on 01/02/22. At the time of the evaluation pt has not yet appreciated improvement in his back pain from this procedure. He has a past medical history of Cardiomyopathy (in setting of Afib), DJD (degenerative joint disease) of knee, History of kidney stones, Intervertebral disc  disorder with radiculopathy of lumbosacral region, Kidney stone (2012), Lower extremity edema, PAF (paroxysmal atrial fibrillation) ([place]), Pernicious anemia, Prostate cancer ([place]), Pulmonary nodule, right, and SVT (supraventricular tachycardia) ([place]). He also  has a past surgical history that includes Knee surgery; Gastric bypass (2002); prostate seeding; Carpal tunnel release (Right, 12/23/2014); Carpal tunnel release (Left, 01/06/2015); Colonoscopy with propofol (N/A, 10/08/2017); and Cardioversion (N/A, 05/15/2019).   Pain:  Pain Intensity: Present: 5/10, Best: 3/10, Worst: 10/10 Pain location: Anterior L shoulder Pain Quality: aching and throbbing Radiating: No  Numbness/Tingling: No Focal Weakness: Yes History of prior shoulder or neck/shoulder injury, pain, surgery, or therapy: Yes, chronic bilateral shoulder pain with R TSR. 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 No AROM or WB through LUE (per protocol), Passive elevation and ER in staged ROM to begin at week 4 (elevation 90 degrees weeks 4-6 with no ER; 0-120 weeks 6-8 with ER 0-30 degrees). No pulleys or cane until after 6 weeks, recommend no driving x 6-8 weeks.   Weight Bearing Restrictions: Yes No WB through LUE for at least 6 weeks  Living Environment Lives with: lives with their spouse Lives in: House/apartment  Patient Goals: Improve L shoulder strength and pain. Pt would like to be able to play with/lift his grandson as well as  take care of his property.   OBJECTIVE:   Patient Surveys  FOTO: To be completed QuickDASH: 47.7%  Cognition Patient is oriented to person, place, and time.  Recent memory is intact.  Remote memory is intact.  Attention span and concentration are intact.  Expressive speech is intact.  Patient's fund of knowledge is within normal limits for educational  level.    Gross Musculoskeletal Assessment Tremor: None Bulk: Mild swelling noted in L lateral upper arm. Pt denies chills, fevers, redness, or dischrage from inicisions Tone: Normal  Gait Deferred  Posture Forward head and rounded shoulders  Cervical Screen Deferred  AROM Full L wrist AROM flexion, extension, radial deviation, and ulnar deviation. MCP flexion and extension WNL. Unable to assess L shoulder and elbow AROM per protocol  PROM Full L elbow PROM flexion and extension however during first attempt at slow passive extension pt reports sudden pain in L shoulder which eventually improve after a couple minutes of rest. Full L elbow PROM pronation/supination.   LE MMT: Full L wrist strength for flexion, extension, radial deviation, and ulnar deviation. L grip strength is intact and strong. Deferred testing of L elbow and shoulder strength per protocol  Sensation Grossly intact to light touch throughout LUE as determined by testing dermatomes C2-T2. Proprioception and hot/cold testing deferred on this date.  Reflexes Deferred  Palpation Pt is generally tender to light palpation around anterior, lateral, and posterior L shoulder;  Repeated Movements Deferred  Passive Accessory Intervertebral Motion Deferred  Special Testing Deferred   TODAY'S TREATMENT   01/17/22   SUBJECTIVE: Pt. Returns to MD this Friday (01/20/22) for f/u and stitch removal.  Pt. Has a shoulder sling in car and removed prior to PT tx. Session.  Pt. Reports he has been weaning off pain pills over past week.  Pt. Reports 8/10 pain at worst  in evening and 5/10 in L shoulder currently at rest/ 3/10 R shoulder (top shoulder to elbow).  Pt. States he is tired all the time and reports it is because he is in/out of A-fib.     PAIN: L shoulder pain 5/10 L shoulder/ 3/10 R shoulder.     Ther-ex:  Pre-activity vitals: HR: 82 bpm, BP: 120/82, SpO2%: 98%, pulse is irregularly irregular by palpation and auscultation   All exercises performed in semirecumbent position: L wrist AROM flexion, extension, radial deviation, and ulnar deviation against gravity 2 x 10 each direction; L hand composite gripping with green putty while performing RUE exercises; L digits 1-5 individual flexion with green putty while performing RUE exercises; R shoulder flexion from neutral to 90 degrees with 2# dumbbell 10x2; R tricep extension 10x2; R shoulder serratus punch with 2# dumbbell 10x2; R shoulder circles with 2# dumbbell CW/CCW x 10 each direction; R elbow flexion with 2# dumbbell 10x2; R elbow extension with manual resistance from therapist x 10; Discussed HEP/ issued green theraputty.    Manual Therapy  L elbow PROM flexion/extension x multiple bouts; L elbow PROM pronation/supination x multiple bouts; Reinforced L shoulder precautions;  Assessment of L shoulder incisions/ stitches.  Healing well.  Will hopefully be removed on Friday.      PATIENT EDUCATION:  Education details: L shoulder precautions, Pt educated throughout session about proper posture and technique with exercises. Improved exercise technique, movement at target joints, use of target muscles after min to mod verbal, visual, tactile cues.  Person educated: Patient Education method: Explanation Education comprehension: verbalized understanding and returned demonstration   HOME EXERCISE PROGRAM: Access Code: [email or web address] URL: [email or web address] Date: 01/10/2022 Prepared by: Roxana Hires  Exercises - Wrist Flexion AROM  - 2 x daily - 7 x weekly - 2 sets - 10  reps - 3s hold - Wrist Extension AROM  - 2 x daily - 7 x weekly - 2 sets - 10 reps - 3s hold - Seated Scapular Retraction  -  2 x daily - 7 x weekly - 2 sets - 10 reps - 3s hold - Supported Elbow Flexion Extension PROM  - 2 x daily - 7 x weekly - 2 sets - 10 reps - 3s hold - Forearm Supination PROM (Mirrored)  - 2 x daily - 7 x weekly - 2 sets - 10 reps - 3s hold - Forearm Pronation PROM (Mirrored)  - 2 x daily - 7 x weekly - 2 sets - 10 reps - 3s hold - Seated Gripping Towel  - 2 x daily - 7 x weekly - 2 sets - 10 reps - 3s hold   ASSESSMENT:  CLINICAL IMPRESSION: PT continued to assess vital prior to tx. Due to complaints of A-fib.  Pt. Planning to have f/u with Cardiologist.  PT continued with PROM for L elbow and AROM for L wrist/hand. Continued with R shoulder strengthening (increase sets today) as well during session. Reinforced L shoulder precautions however pt still appears to be using his LUE actively when both in and out of the sling. Pt encouraged to continue HEP and follow-up as scheduled. He will benefit from skilled PT to address deficits in range of motion and strength in order to improve overall function at home and with leisure activities such as caring for his property.   REHAB POTENTIAL: Good  CLINICAL DECISION MAKING: Evolving/moderate complexity  EVALUATION COMPLEXITY: Moderate   GOALS: Goals reviewed with patient? Yes  SHORT TERM GOALS: Target date: 02/14/22  Pt will be independent with HEP to improve strength and decrease right shoulder pain to improve pain-free function at home and work. Baseline:  Goal status: INITIAL   LONG TERM GOALS: Target date: 03/28/22  Pt will increase L shoulder FOTO to at least pre-determined value to demonstrate significant improvement in function at home and with leisure activities related to L shoulder. Baseline: 01/03/22: To be completed Goal status: INITIAL  2.  Pt will decrease worst L shoulder pain by at least 3 points on  the NPRS in order to demonstrate clinically significant reduction in R shoulder pain. Baseline: 01/03/22: worst: 10/10 Goal status: INITIAL  3.  Pt will decrease quick DASH score by at least 8% in order to demonstrate clinically significant reduction in disability related to L shoulder pain        Baseline: 01/03/22: 47.7% Goal status: INITIAL  4. Pt will demonstrate L shoulder flexion, abduction, IR, and ER strength of at least 4/5 in order to demonstrate improvement in strength and function       Baseline: 01/03/22: Unable to test per protocol Goal status: INITIAL   PLAN: PT FREQUENCY: 1-2x/week  PT DURATION: 12 weeks  PLANNED INTERVENTIONS: Therapeutic exercises, Therapeutic activity, Neuromuscular re-education, Balance training, Gait training, Patient/Family education, Joint manipulation, Joint mobilization, Vestibular training, Canalith repositioning, Aquatic Therapy, Dry Needling, Electrical stimulation, Spinal manipulation, Spinal mobilization, Cryotherapy, Moist heat, Traction, Ultrasound, Ionotophoresis 80m/ml Dexamethasone, and Manual therapy  PLAN FOR NEXT SESSION: Have pt complete FOTO, Progress L shoulder ROM and strength per protocol  MPura Spice PT, DPT # [id number]/02/2022, 6:06 PM

## 2022-01-19 ENCOUNTER — Ambulatory Visit: Payer: Medicare Other | Admitting: Physical Therapy

## 2022-01-19 ENCOUNTER — Telehealth: Payer: Self-pay | Admitting: Cardiovascular Disease

## 2022-01-19 DIAGNOSIS — M25512 Pain in left shoulder: Secondary | ICD-10-CM | POA: Diagnosis not present

## 2022-01-19 DIAGNOSIS — M6281 Muscle weakness (generalized): Secondary | ICD-10-CM | POA: Diagnosis not present

## 2022-01-19 DIAGNOSIS — G8929 Other chronic pain: Secondary | ICD-10-CM

## 2022-01-19 DIAGNOSIS — M25511 Pain in right shoulder: Secondary | ICD-10-CM | POA: Diagnosis not present

## 2022-01-19 DIAGNOSIS — R293 Abnormal posture: Secondary | ICD-10-CM | POA: Diagnosis not present

## 2022-01-19 DIAGNOSIS — M5459 Other low back pain: Secondary | ICD-10-CM | POA: Diagnosis not present

## 2022-01-19 NOTE — Telephone Encounter (Signed)
Left a message for the patient to call back to schedule an appointment with Dr. Rockey Situ (per the patient request)

## 2022-01-19 NOTE — Telephone Encounter (Signed)
Spoke to the patient. Appointment made for 1/23 with Dr. Rockey Situ. He will call back if any is needed sooner.

## 2022-01-19 NOTE — Telephone Encounter (Signed)
Returned the call to the patient. He stated that he went into afib on 12/12 during his shoulder surgery and 12/18 during his ablation on the spine. He stated that the he does not feel like he is currently in afib.   He would like to have an appointment with Dr. Rockey Situ to discuss this and to discuss a different anticoagulant.

## 2022-01-19 NOTE — Telephone Encounter (Signed)
Pt states he had two procedures done in December (both in [place]). During those procedure he went into afib and was wondering if Dr. Rockey Hill wanted him to come in sooner than October 2024 or how he can manage this better. Please advise

## 2022-01-19 NOTE — Therapy (Signed)
OUTPATIENT PHYSICAL THERAPY SHOULDER TREATMENT  Patient Name: [name] MRN: [id number] DOB:22-Apr-1955, 67 y.o., male Today's Date: 01/19/2022   PT End of Session - 01/19/22 1604     Visit Number 4    Number of Visits 25    Date for PT Re-Evaluation 03/28/22    Authorization Type eval: 01/03/22    PT Start Time 1601    PT Stop Time 1640    PT Time Calculation (min) 39 min    Activity Tolerance Patient tolerated treatment well    Behavior During Therapy WFL for tasks assessed/performed            Past Medical History:  Diagnosis Date   Anxiety    Aortic atherosclerosis ([place])    Cardiomyopathy (in setting of Afib)    a.) TTE 12/26/2013: EF 45-50%, mild ant and antsept HK. mild MR. Mod dil LA. nl RV fxn. Rhythm was Afib; b.) TTE 07/04/2019: EF 55%, mid-apical anteroseptal HK, mild MAC, mild Ao sclerosis, G2DD, RVSP 45.3   Chronic pain syndrome    a.) followed by pain management   Chronic, continuous use of opioids    a.) hydrocodone/APAP 7.5/325 mg; followed by pain management   Coronary artery disease 11/06/2014   a.) cCTA 11/06/2014: Ca score 224 (all in pLAD) -- 74th percentile for age/sex matched control   Diverticulosis    DJD (degenerative joint disease) of knee    History of hiatal hernia    History of kidney stones 2012   Hyperlipidemia    Hyperplastic colon polyp    Hypertension    Internal hemorrhoids    Intervertebral disc disorder with radiculopathy of lumbosacral region    Long term current use FULL DOSE (325 mg) aspirin    Long term current use of amiodarone    Long term current use of antithrombotics/antiplatelets    a.) rivaroxaban   Lower extremity edema    Mixed hyperlipidemia    Myalgia due to statin    Osteoarthritis    PAF (paroxysmal atrial fibrillation) ([place])    a.) CHA2DS2VASc = 4 (age, HTN, CHF, vascular disease history);  b.) s/p DCCV 03/13/2014 (200 J x1); c.) s/p DCCV 05/15/2019 (150 J x 1, 200 J x2); d.) rate/rhythm maintained on oral  amiodarone + metoprolol succinate; chronically anticoagulated with rivaroxaban   Pernicious anemia    Prostate cancer ([place])    Pulmonary nodule, right    a. 10/2014 Cardiac CTA: 79m RLL nodule; b. 04/2015 CT Chest: stable 74mRLL nodule. No new nodules; 02/2017 CTA Chest: stable, benign, 56m25mLL pulm nodule.   Sleep difficulties    a.) takes melatonin + trazodone PRN   SVT (supraventricular tachycardia)    Tubular adenoma of colon    Past Surgical History:  Procedure Laterality Date   BICEPT TENODESIS Right 09/27/2021   Procedure: Right reverse shoulder arthroplasty, biceps tenodesis;  Surgeon: PatLeim FabryD;  Location: [facility];  Service: Orthopedics;  Laterality: Right;   CARDIOVERSION N/A 05/15/2019   Procedure: CARDIOVERSION;  Surgeon: GolMinna MerrittsD;  Location: [facility];  Service: Cardiovascular;  Laterality: N/A;   CARDIOVERSION N/A 03/13/2014   Procedure: CARDIOVERSION; [facility]: TimIda RogueD   CARPAL TUNNEL RELEASE Right 12/23/2014   Procedure: CARPAL TUNNEL RELEASE;  Surgeon: HowEarnestine LeysD;  Location: [facility];  Service: Orthopedics;  Laterality: Right;   CARPAL TUNNEL RELEASE Left 01/06/2015   Procedure: CARPAL TUNNEL RELEASE;  Surgeon: HowEarnestine LeysD;  Location: [facility];  Service: Orthopedics;  Laterality: Left;   COLONOSCOPY WITH PROPOFOL N/A 10/08/2017   Procedure: COLONOSCOPY WITH PROPOFOL;  Surgeon: Lin Landsman, MD;  Location: [facility];  Service: Gastroenterology;  Laterality: N/A;   GASTRIC BYPASS  01/17/2000   KNEE SURGERY Right    knee trauma x3   prostate seeding     REVERSE SHOULDER ARTHROPLASTY Right 09/27/2021   Procedure: Right reverse shoulder arthroplasty, biceps tenodesis;  Surgeon: Leim Fabry, MD;  Location: [facility];  Service: Orthopedics;  Laterality: Right;   SHOULDER ARTHROSCOPY WITH ROTATOR CUFF REPAIR AND OPEN BICEPS TENODESIS Left 12/27/2021   Procedure: Left shoulder arthroscopic cuff repair  (supraspinatus and subscapularis) with Regeneten Patch application;  Surgeon: Leim Fabry, MD;  Location: [facility];  Service: Orthopedics;  Laterality: Left;   Patient Active Problem List   Diagnosis Date Noted   S/p reverse total shoulder arthroplasty 09/27/2021   Lesion of bone of lumbosacral spine (L5) 05/12/2021   Localized osteoarthritis of shoulder regions, bilateral 03/29/2021   Chronic pain of both shoulders 03/29/2021   Drug-induced myopathy 02/21/2021   Trigger finger of right hand 11/15/2020   Prostate cancer ([place]) 07/26/2020   Insomnia 08/01/2019   Primary osteoarthritis of both wrists 02/17/2019   Trigger middle finger of left hand 02/17/2019   Chronic pain syndrome 02/17/2019   Chronic radicular lumbar pain 10/08/2018   Lumbar radiculopathy 10/08/2018   Lumbar degenerative disc disease 10/08/2018   Lumbar facet arthropathy 10/08/2018   Lumbar facet joint syndrome 10/08/2018   Intervertebral disc disorder with radiculopathy of lumbosacral region    Pernicious anemia 05/22/2016   Paroxysmal atrial fibrillation ([place]) 12/20/2015   DJD (degenerative joint disease) of knee 10/27/2014   Bilateral carpal tunnel syndrome 10/27/2014   Morbid obesity with BMI of 50.0-59.9, adult ([place]) 10/27/2014   Mixed hyperlipidemia 10/27/2014   H/O gastric bypass 03/20/2014   Hyperkalemia 03/20/2014   History of prostate cancer 12/26/2013   Essential hypertension 12/26/2013   Encounter for anticoagulation discussion and counseling 12/26/2013    PCP: Olin Hauser, DO  REFERRING PROVIDER: Leim Fabry, MD  REFERRING DIAGNOSIS: Incomplete tear of left rotator cuff, unspecified whether traumatic M75.112   THERAPY DIAG: Chronic left shoulder pain  Muscle weakness (generalized)  Chronic right shoulder pain  RATIONALE FOR EVALUATION AND TREATMENT: Rehabilitation  ONSET DATE: Surgery 12/27/21, Chronic L shoulder pain for multiple years  FOLLOW UP APPT WITH PROVIDER: Yes     FROM INITIAL EVALUATION SUBJECTIVE:  Chief Complaint: S/p L shoulder RTC repair, biceps tenodesis, and shoulder debridement  Pertinent History Pt with history of chronic bilateral shoulder pain. He underwent right reverse TSR and right biceps tenodesis on 09/27/21. He experienced post-operative instability in R shoulder however it improved and he was able to avoid a revision. On 12/27/21 he underwent left arthroscopic rotator cuff repair (subscapularis (full-thickness), supraspinatus (partial-thickness) side-to-side repair + Regeneten patch), left arthroscopic biceps tenodesis, and left extensive debridement of shoulder (glenohumeral and subacromial spaces). Per surgical note other intraoperative findings include Type 2 SLAP tear, degenerative anterior labral tearing, grade 2 degenerative changes to articular cartilage of humeral head, and significant posterior capsule synovitis. No significant post-operative complications. He arrives without abduction pillow and states that Dr. Posey Pronto advised him he doesn't have to wear the abduction pillow. He got behind on his pain medication today and states that today his shoulder has been the most painful he has experienced since surgery. He also noticed swelling in L lateral upper arm this morning. He did take his pain medication at 14:00, before PT evaluation.  He has a history of chronic back pain and underwent a lumbar facet, medial branch radiofrequency ablation on 01/02/22. At the time of the evaluation pt has not yet appreciated improvement in his back pain from this procedure. He has a past medical history of Cardiomyopathy (in setting of Afib), DJD (degenerative joint disease) of knee, History of kidney stones, Intervertebral disc disorder with radiculopathy of lumbosacral  region, Kidney stone (2012), Lower extremity edema, PAF (paroxysmal atrial fibrillation) ([place]), Pernicious anemia, Prostate cancer ([place]), Pulmonary nodule, right, and SVT (supraventricular tachycardia) ([place]). He also  has a past surgical history that includes Knee surgery; Gastric bypass (2002); prostate seeding; Carpal tunnel release (Right, 12/23/2014); Carpal tunnel release (Left, 01/06/2015); Colonoscopy with propofol (N/A, 10/08/2017); and Cardioversion (N/A, 05/15/2019).   Pain:  Pain Intensity: Present: 5/10, Best: 3/10, Worst: 10/10 Pain location: Anterior L shoulder Pain Quality: aching and throbbing Radiating: No  Numbness/Tingling: No Focal Weakness: Yes History of prior shoulder or neck/shoulder injury, pain, surgery, or therapy: Yes, chronic bilateral shoulder pain with R TSR. 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 No AROM or WB through LUE (per protocol), Passive elevation and ER in staged ROM to begin at week 4 (elevation 90 degrees weeks 4-6 with no ER; 0-120 weeks 6-8 with ER 0-30 degrees). No pulleys or cane until after 6 weeks, recommend no driving x 6-8 weeks.   Weight Bearing Restrictions: Yes No WB through LUE for at least 6 weeks  Living Environment Lives with: lives with their spouse Lives in: House/apartment  Patient Goals: Improve L shoulder strength and pain. Pt would like to be able to play with/lift his grandson as well as take care of his property.   OBJECTIVE:    Patient Surveys  FOTO: To be completed QuickDASH: 47.7%  Cognition Patient is oriented to person, place, and time.  Recent memory is intact.  Remote memory is intact.  Attention span and concentration are intact.  Expressive speech is intact.  Patient's fund of knowledge is within normal limits for educational  level.    Gross Musculoskeletal Assessment Tremor: None Bulk: Mild swelling noted in L lateral upper arm. Pt denies chills, fevers, redness, or dischrage from inicisions Tone: Normal  Gait Deferred  Posture Forward head and rounded shoulders  Cervical Screen Deferred  AROM Full L wrist AROM flexion, extension, radial deviation, and ulnar deviation. MCP flexion and extension WNL. Unable to assess L shoulder and elbow AROM per protocol  PROM Full L elbow PROM flexion and extension however during first attempt at slow passive extension pt reports sudden pain in L shoulder which eventually improve after a couple minutes of rest. Full L elbow PROM pronation/supination.   LE MMT: Full L wrist strength for flexion, extension, radial deviation, and ulnar deviation. L grip strength is intact and strong. Deferred testing of L elbow and shoulder strength per protocol  Sensation Grossly intact to light touch throughout LUE as determined by testing dermatomes C2-T2. Proprioception and hot/cold testing deferred on this date.  Reflexes Deferred  Palpation Pt is generally tender to light palpation around anterior, lateral, and posterior L shoulder;  Repeated Movements Deferred  Passive Accessory Intervertebral Motion Deferred  Special Testing Deferred   TODAY'S TREATMENT   01/19/22   SUBJECTIVE: Pt. Returns to MD this Friday (01/20/22) for f/u and stitch removal.  Pt. Has a shoulder sling in car and removed prior to PT tx. Session.  Pt. Reports he has been weaning off pain pills over past week.  Pt. Reports 2/10 R shoulder pain and 3/10 pain in L shoulder currently.   Pt. States he is still tired due to A-fib.  Pt. Is waiting to hear back from Cardiologist.  Pt. States that Tuesday night he was holding his grandson at [employer] in R arm and noticed shoulder "popped out".  He was able to get shoulder back in place by turning to L and supporting arm.      PAIN: L shoulder pain 3/10 L shoulder/ 2/10 R shoulder.    Ther-ex:  Pre-activity vitals: HR: 80 bpm, BP: 120/64, SpO2%: 98%, pulse is irregularly irregular by palpation and auscultation   All exercises performed in semirecumbent position: L wrist AROM flexion, extension, radial deviation, and ulnar deviation against gravity 2 x 10 each direction; L hand composite gripping with green putty while performing RUE exercises; L digits 1-5 individual flexion with green putty while performing RUE exercises; R shoulder flexion from neutral to 90 degrees with 3# dumbbell 10x2; R tricep extension 3# 10x2 (slight increase in R sh. Pain); R shoulder serratus punch with 3# dumbbell 10x2; R shoulder circles with 3# dumbbell CW/CCW x 10 each direction; R elbow flexion with 3# dumbbell 10x2; R elbow extension with manual resistance from therapist x 10; Discussed HEP/ issued green theraputty.    Manual Therapy  L elbow PROM flexion/extension x multiple bouts; L elbow PROM pronation/supination x multiple bouts; Reinforced L shoulder precautions;  Assessment of L shoulder incisions/ stitches.  Healing well.  Will hopefully be removed on Friday.      PATIENT EDUCATION:  Education details: L shoulder precautions, Pt educated throughout session about proper posture and technique with exercises. Improved exercise technique, movement at target joints, use of target muscles after min to mod verbal, visual, tactile cues.  Person educated: Patient Education method: Explanation Education comprehension: verbalized understanding and returned demonstration   HOME EXERCISE PROGRAM: Access Code: [email or web address] URL:  [email or web address] Date: 01/10/2022 Prepared by: Roxana Hires  Exercises - Wrist Flexion AROM  - 2 x daily - 7 x weekly -  2 sets - 10 reps - 3s hold - Wrist Extension AROM  - 2 x daily - 7 x weekly - 2 sets - 10 reps - 3s hold - Seated Scapular Retraction  - 2 x daily - 7 x weekly - 2 sets - 10 reps - 3s hold - Supported Elbow Flexion Extension PROM  - 2 x daily - 7 x weekly - 2 sets - 10 reps - 3s hold - Forearm Supination PROM (Mirrored)  - 2 x daily - 7 x weekly - 2 sets - 10 reps - 3s hold - Forearm Pronation PROM (Mirrored)  - 2 x daily - 7 x weekly - 2 sets - 10 reps - 3s hold - Seated Gripping Towel  - 2 x daily - 7 x weekly - 2 sets - 10 reps - 3s hold   ASSESSMENT:  CLINICAL IMPRESSION: PT continued to assess vitals prior to tx. Due to complaints of A-fib.  Pt. Planning to have f/u with Cardiologist.  PT continued with PROM for L elbow and AROM for L wrist/hand. Continued with R shoulder strengthening (increase to 3# today). Reinforced L shoulder precautions however pt still appears to be using his LUE actively when both in and out of the sling. Pt encouraged to continue HEP and follow-up as scheduled. He will benefit from skilled PT to address deficits in range of motion and strength in order to improve overall function at home and with leisure activities such as caring for his property.   REHAB POTENTIAL: Good  CLINICAL DECISION MAKING: Evolving/moderate complexity  EVALUATION COMPLEXITY: Moderate   GOALS: Goals reviewed with patient? Yes  SHORT TERM GOALS: Target date: 02/14/22  Pt will be independent with HEP to improve strength and decrease right shoulder pain to improve pain-free function at home and work. Baseline:  Goal status: INITIAL   LONG TERM GOALS: Target date: 03/28/22  Pt will increase L shoulder FOTO to at least pre-determined value to demonstrate significant improvement in function at home and with leisure activities related to L  shoulder. Baseline: 01/03/22: To be completed Goal status: INITIAL  2.  Pt will decrease worst L shoulder pain by at least 3 points on the NPRS in order to demonstrate clinically significant reduction in R shoulder pain. Baseline: 01/03/22: worst: 10/10 Goal status: INITIAL  3.  Pt will decrease quick DASH score by at least 8% in order to demonstrate clinically significant reduction in disability related to L shoulder pain        Baseline: 01/03/22: 47.7% Goal status: INITIAL  4. Pt will demonstrate L shoulder flexion, abduction, IR, and ER strength of at least 4/5 in order to demonstrate improvement in strength and function       Baseline: 01/03/22: Unable to test per protocol Goal status: INITIAL   PLAN: PT FREQUENCY: 1-2x/week  PT DURATION: 12 weeks  PLANNED INTERVENTIONS: Therapeutic exercises, Therapeutic activity, Neuromuscular re-education, Balance training, Gait training, Patient/Family education, Joint manipulation, Joint mobilization, Vestibular training, Canalith repositioning, Aquatic Therapy, Dry Needling, Electrical stimulation, Spinal manipulation, Spinal mobilization, Cryotherapy, Moist heat, Traction, Ultrasound, Ionotophoresis 45m/ml Dexamethasone, and Manual therapy  PLAN FOR NEXT SESSION: Progress L shoulder ROM and strength per protocol.  Complete FOTO next tx. For L shoulder  MPura Spice PT, DPT # [phone]/04/2022, 6:22 PM

## 2022-01-21 ENCOUNTER — Other Ambulatory Visit: Payer: Self-pay | Admitting: Cardiovascular Disease

## 2022-01-24 ENCOUNTER — Ambulatory Visit: Payer: Medicare Other

## 2022-01-24 DIAGNOSIS — G8929 Other chronic pain: Secondary | ICD-10-CM

## 2022-01-24 DIAGNOSIS — M5459 Other low back pain: Secondary | ICD-10-CM | POA: Diagnosis not present

## 2022-01-24 DIAGNOSIS — M25511 Pain in right shoulder: Secondary | ICD-10-CM | POA: Diagnosis not present

## 2022-01-24 DIAGNOSIS — R293 Abnormal posture: Secondary | ICD-10-CM | POA: Diagnosis not present

## 2022-01-24 DIAGNOSIS — M25512 Pain in left shoulder: Secondary | ICD-10-CM | POA: Diagnosis not present

## 2022-01-24 DIAGNOSIS — M6281 Muscle weakness (generalized): Secondary | ICD-10-CM | POA: Diagnosis not present

## 2022-01-24 NOTE — Therapy (Signed)
OUTPATIENT PHYSICAL THERAPY SHOULDER TREATMENT  Patient Name: [name] MRN: [id number] DOB:1955-06-09, 67 y.o., male Today's Date: 01/24/2022   PT End of Session - 01/24/22 1309     Visit Number 5    Number of Visits 25    Date for PT Re-Evaluation 03/28/22    Authorization Type eval: 01/03/22    PT Start Time 1315    PT Stop Time 1400    PT Time Calculation (min) 45 min    Activity Tolerance Patient tolerated treatment well    Behavior During Therapy WFL for tasks assessed/performed            Past Medical History:  Diagnosis Date   Anxiety    Aortic atherosclerosis ([place])    Cardiomyopathy (in setting of Afib)    a.) TTE 12/26/2013: EF 45-50%, mild ant and antsept HK. mild MR. Mod dil LA. nl RV fxn. Rhythm was Afib; b.) TTE 07/04/2019: EF 55%, mid-apical anteroseptal HK, mild MAC, mild Ao sclerosis, G2DD, RVSP 45.3   Chronic pain syndrome    a.) followed by pain management   Chronic, continuous use of opioids    a.) hydrocodone/APAP 7.5/325 mg; followed by pain management   Coronary artery disease 11/06/2014   a.) cCTA 11/06/2014: Ca score 224 (all in pLAD) -- 74th percentile for age/sex matched control   Diverticulosis    DJD (degenerative joint disease) of knee    History of hiatal hernia    History of kidney stones 2012   Hyperlipidemia    Hyperplastic colon polyp    Hypertension    Internal hemorrhoids    Intervertebral disc disorder with radiculopathy of lumbosacral region    Long term current use FULL DOSE (325 mg) aspirin    Long term current use of amiodarone    Long term current use of antithrombotics/antiplatelets    a.) rivaroxaban   Lower extremity edema    Mixed hyperlipidemia    Myalgia due to statin    Osteoarthritis    PAF (paroxysmal atrial fibrillation) ([place])    a.) CHA2DS2VASc = 4 (age, HTN, CHF, vascular disease history);  b.) s/p DCCV 03/13/2014 (200 J x1); c.) s/p DCCV 05/15/2019 (150 J x 1, 200 J x2); d.) rate/rhythm maintained on oral  amiodarone + metoprolol succinate; chronically anticoagulated with rivaroxaban   Pernicious anemia    Prostate cancer ([place])    Pulmonary nodule, right    a. 10/2014 Cardiac CTA: 59m RLL nodule; b. 04/2015 CT Chest: stable 751mRLL nodule. No new nodules; 02/2017 CTA Chest: stable, benign, 58m58mLL pulm nodule.   Sleep difficulties    a.) takes melatonin + trazodone PRN   SVT (supraventricular tachycardia)    Tubular adenoma of colon    Past Surgical History:  Procedure Laterality Date   BICEPT TENODESIS Right 09/27/2021   Procedure: Right reverse shoulder arthroplasty, biceps tenodesis;  Surgeon: PatLeim FabryD;  Location: [facility];  Service: Orthopedics;  Laterality: Right;   CARDIOVERSION N/A 05/15/2019   Procedure: CARDIOVERSION;  Surgeon: GolMinna MerrittsD;  Location: [facility];  Service: Cardiovascular;  Laterality: N/A;   CARDIOVERSION N/A 03/13/2014   Procedure: CARDIOVERSION; [facility] TimIda RogueD   CARPAL TUNNEL RELEASE Right 12/23/2014   Procedure: CARPAL TUNNEL RELEASE;  Surgeon: HowEarnestine LeysD;  Location: [facility];  Service: Orthopedics;  Laterality: Right;   CARPAL TUNNEL RELEASE Left 01/06/2015   Procedure: CARPAL TUNNEL RELEASE;  Surgeon: HowEarnestine LeysD;  Location: [facility];  Service: Orthopedics;  Laterality: Left;   COLONOSCOPY WITH PROPOFOL N/A 10/08/2017   Procedure: COLONOSCOPY WITH PROPOFOL;  Surgeon: Lin Landsman, MD;  Location: [facility];  Service: Gastroenterology;  Laterality: N/A;   GASTRIC BYPASS  01/17/2000   KNEE SURGERY Right    knee trauma x3   prostate seeding     REVERSE SHOULDER ARTHROPLASTY Right 09/27/2021   Procedure: Right reverse shoulder arthroplasty, biceps tenodesis;  Surgeon: Leim Fabry, MD;  Location: [facility];  Service: Orthopedics;  Laterality: Right;   SHOULDER ARTHROSCOPY WITH ROTATOR CUFF REPAIR AND OPEN BICEPS TENODESIS Left 12/27/2021   Procedure: Left shoulder arthroscopic cuff repair  (supraspinatus and subscapularis) with Regeneten Patch application;  Surgeon: Leim Fabry, MD;  Location: [facility];  Service: Orthopedics;  Laterality: Left;   Patient Active Problem List   Diagnosis Date Noted   S/p reverse total shoulder arthroplasty 09/27/2021   Lesion of bone of lumbosacral spine (L5) 05/12/2021   Localized osteoarthritis of shoulder regions, bilateral 03/29/2021   Chronic pain of both shoulders 03/29/2021   Drug-induced myopathy 02/21/2021   Trigger finger of right hand 11/15/2020   Prostate cancer ([place]) 07/26/2020   Insomnia 08/01/2019   Primary osteoarthritis of both wrists 02/17/2019   Trigger middle finger of left hand 02/17/2019   Chronic pain syndrome 02/17/2019   Chronic radicular lumbar pain 10/08/2018   Lumbar radiculopathy 10/08/2018   Lumbar degenerative disc disease 10/08/2018   Lumbar facet arthropathy 10/08/2018   Lumbar facet joint syndrome 10/08/2018   Intervertebral disc disorder with radiculopathy of lumbosacral region    Pernicious anemia 05/22/2016   Paroxysmal atrial fibrillation ([place]) 12/20/2015   DJD (degenerative joint disease) of knee 10/27/2014   Bilateral carpal tunnel syndrome 10/27/2014   Morbid obesity with BMI of 50.0-59.9, adult ([place]) 10/27/2014   Mixed hyperlipidemia 10/27/2014   H/O gastric bypass 03/20/2014   Hyperkalemia 03/20/2014   History of prostate cancer 12/26/2013   Essential hypertension 12/26/2013   Encounter for anticoagulation discussion and counseling 12/26/2013   PCP: Olin Hauser, DO  REFERRING PROVIDER: Leim Fabry, MD  REFERRING DIAGNOSIS: Incomplete tear of left rotator cuff, unspecified whether traumatic M75.112   THERAPY DIAG: Chronic left shoulder pain  Chronic right shoulder pain  RATIONALE FOR EVALUATION AND TREATMENT: Rehabilitation  ONSET DATE: Surgery 12/27/21, Chronic L shoulder pain for multiple years  FOLLOW UP APPT WITH PROVIDER: Yes    FROM INITIAL  EVALUATION SUBJECTIVE:                                                                                                                                                                                         Chief Complaint: S/p  L shoulder RTC repair, biceps tenodesis, and shoulder debridement  Pertinent History Pt with history of chronic bilateral shoulder pain. He underwent right reverse TSR and right biceps tenodesis on 09/27/21. He experienced post-operative instability in R shoulder however it improved and he was able to avoid a revision. On 12/27/21 he underwent left arthroscopic rotator cuff repair (subscapularis (full-thickness), supraspinatus (partial-thickness) side-to-side repair + Regeneten patch), left arthroscopic biceps tenodesis, and left extensive debridement of shoulder (glenohumeral and subacromial spaces). Per surgical note other intraoperative findings include Type 2 SLAP tear, degenerative anterior labral tearing, grade 2 degenerative changes to articular cartilage of humeral head, and significant posterior capsule synovitis. No significant post-operative complications. He arrives without abduction pillow and states that Dr. Posey Pronto advised him he doesn't have to wear the abduction pillow. He got behind on his pain medication today and states that today his shoulder has been the most painful he has experienced since surgery. He also noticed swelling in L lateral upper arm this morning. He did take his pain medication at 14:00, before PT evaluation.  He has a history of chronic back pain and underwent a lumbar facet, medial branch radiofrequency ablation on 01/02/22. At the time of the evaluation pt has not yet appreciated improvement in his back pain from this procedure. He has a past medical history of Cardiomyopathy (in setting of Afib), DJD (degenerative joint disease) of knee, History of kidney stones, Intervertebral disc disorder with radiculopathy of lumbosacral region, Kidney stone  (2012), Lower extremity edema, PAF (paroxysmal atrial fibrillation) ([place]), Pernicious anemia, Prostate cancer ([place]), Pulmonary nodule, right, and SVT (supraventricular tachycardia) ([place]). He also  has a past surgical history that includes Knee surgery; Gastric bypass (2002); prostate seeding; Carpal tunnel release (Right, 12/23/2014); Carpal tunnel release (Left, 01/06/2015); Colonoscopy with propofol (N/A, 10/08/2017); and Cardioversion (N/A, 05/15/2019).   Pain:  Pain Intensity: Present: 5/10, Best: 3/10, Worst: 10/10 Pain location: Anterior L shoulder Pain Quality: aching and throbbing Radiating: No  Numbness/Tingling: No Focal Weakness: Yes History of prior shoulder or neck/shoulder injury, pain, surgery, or therapy: Yes, chronic bilateral shoulder pain with R TSR. 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 No AROM or WB through LUE (per protocol), Passive elevation and ER in staged ROM to begin at week 4 (elevation 90 degrees weeks 4-6 with no ER; 0-120 weeks 6-8 with ER 0-30 degrees). No pulleys or cane until after 6 weeks, recommend no driving x 6-8 weeks.   Weight Bearing Restrictions: Yes No WB through LUE for at least 6 weeks  Living Environment Lives with: lives with their spouse Lives in: House/apartment  Patient Goals: Improve L shoulder strength and pain. Pt would like to be able to play with/lift his grandson as well as take care of his property.   OBJECTIVE:   Patient Surveys   FOTO: To be completed QuickDASH: 47.7%  Cognition Patient is oriented to person, place, and time.  Recent memory is intact.  Remote memory is intact.  Attention span and concentration are intact.  Expressive speech is intact.  Patient's fund of knowledge is within normal limits for educational level.  Gross Musculoskeletal Assessment Tremor: None Bulk: Mild swelling noted in L lateral upper arm. Pt denies chills, fevers, redness, or dischrage from inicisions Tone: Normal  Gait Deferred  Posture Forward head and rounded shoulders  Cervical Screen Deferred  AROM Full L wrist AROM flexion, extension, radial deviation, and ulnar deviation. MCP flexion and extension WNL. Unable to assess L shoulder and elbow AROM per protocol  PROM Full L elbow PROM flexion and extension however during first attempt at slow passive extension pt reports sudden pain in L shoulder which eventually improve after a couple minutes of rest. Full L elbow PROM pronation/supination.   LE MMT: Full L wrist strength for flexion, extension, radial deviation, and ulnar deviation. L grip strength is intact and strong. Deferred testing of L elbow and shoulder strength per protocol  Sensation Grossly intact to light touch throughout LUE as determined by testing dermatomes C2-T2. Proprioception and hot/cold testing deferred on this date.  Reflexes Deferred  Palpation Pt is generally tender to light palpation around anterior, lateral, and posterior L shoulder;  Repeated Movements Deferred  Passive Accessory Intervertebral Motion Deferred  Special Testing Deferred   TODAY'S TREATMENT   01/24/22   SUBJECTIVE: Pt had his post-op follow up and his sutures were removed. There were no concerns regarding his L shoulder. He states that he told the provider about his R shoulder dislocating and was told that he would put it in the note and notify Dr. Posey Pronto. However pt reports that there is no indication of  this conversation in the note. He arrives without his L shoulder sling. He has continued to wean off pain pills and has not had any pain medication since 9:00 last night. He has an appointment to see cardiology regarding his afib on 02/07/22.   PAIN: Bilateral shoulder pain, not asked to rate today   Ther-ex:  All exercises performed in semirecumbent position with support under both arms to prevent shoulder extension: L wrist AROM flexion, extension, radial deviation, and ulnar deviation with manual resistance 2 x 10 each direction; R shoulder flexion from neutral to 90 degrees with 3# dumbbell 10x2; R shoulder serratus punch with 3# dumbbell 10x2; R shoulder circles with 3# dumbbell CW/CCW x 10 each direction; R elbow flexion with 3# dumbbell 10x2; R elbow extension with manual resistance from therapist 10x2; R shoulder internal rotation from forearm vertical to stomach with manual resistance from therapist 10x2; R shoulder external rotation from stomach to forearm vertical with manual resistance from therapist 10x2;   Manual Therapy  L elbow PROM flexion/extension x multiple bouts; L elbow PROM pronation/supination x multiple bouts; STM to L upper trap, L cervical paraspinals, and L levator scapulae; Assessment of L shoulder incisions. Healing well with steri-strips in place.     PATIENT EDUCATION:  Education details:  Pt educated throughout session about proper posture and technique with exercises. Improved exercise technique, movement at target joints, use of target muscles after min to mod verbal, visual, tactile cues.  Person educated: Patient Education method: Explanation Education comprehension: verbalized understanding and returned demonstration   HOME EXERCISE PROGRAM: Access Code: [email or web address] URL: [email or web address] Date: 01/10/2022 Prepared by: Roxana Hires  Exercises - Wrist Flexion AROM  - 2 x daily - 7 x weekly - 2 sets - 10 reps - 3s hold - Wrist  Extension AROM  - 2 x daily - 7 x weekly - 2 sets - 10 reps - 3s hold - Seated Scapular Retraction  - 2 x daily - 7 x weekly -  2 sets - 10 reps - 3s hold - Supported Elbow Flexion Extension PROM  - 2 x daily - 7 x weekly - 2 sets - 10 reps - 3s hold - Forearm Supination PROM (Mirrored)  - 2 x daily - 7 x weekly - 2 sets - 10 reps - 3s hold - Forearm Pronation PROM (Mirrored)  - 2 x daily - 7 x weekly - 2 sets - 10 reps - 3s hold - Seated Gripping Towel  - 2 x daily - 7 x weekly - 2 sets - 10 reps - 3s hold   ASSESSMENT:  CLINICAL IMPRESSION: Pt demonstrates excellent motivation during session. PT continued with PROM for L elbow and AROM for L wrist/hand. Continued with R shoulder strengthening  and maintained at 3# today. Pt still appears to be using his LUE actively when entering/exiting the clinic. He now has a follow-up appointment scheduled with cardiology to discuss his afib. Pt encouraged to continue HEP and follow-up as scheduled. He will benefit from skilled PT to address deficits in range of motion and strength in order to improve overall function at home and with leisure activities such as caring for his property.   REHAB POTENTIAL: Good  CLINICAL DECISION MAKING: Evolving/moderate complexity  EVALUATION COMPLEXITY: Moderate   GOALS: Goals reviewed with patient? Yes  SHORT TERM GOALS: Target date: 02/14/22  Pt will be independent with HEP to improve strength and decrease right shoulder pain to improve pain-free function at home and work. Baseline:  Goal status: INITIAL   LONG TERM GOALS: Target date: 03/28/22  Pt will increase L shoulder FOTO to at least 51 to demonstrate significant improvement in function at home and with leisure activities related to L shoulder. Baseline: 01/03/22: To be completed; 01/24/22: 4 Goal status: INITIAL  2.  Pt will decrease worst L shoulder pain by at least 3 points on the NPRS in order to demonstrate clinically significant reduction in R  shoulder pain. Baseline: 01/03/22: worst: 10/10 Goal status: INITIAL  3.  Pt will decrease quick DASH score by at least 8% in order to demonstrate clinically significant reduction in disability related to L shoulder pain        Baseline: 01/03/22: 47.7% Goal status: INITIAL  4. Pt will demonstrate L shoulder flexion, abduction, IR, and ER strength of at least 4/5 in order to demonstrate improvement in strength and function       Baseline: 01/03/22: Unable to test per protocol Goal status: INITIAL   PLAN: PT FREQUENCY: 1-2x/week  PT DURATION: 12 weeks  PLANNED INTERVENTIONS: Therapeutic exercises, Therapeutic activity, Neuromuscular re-education, Balance training, Gait training, Patient/Family education, Joint manipulation, Joint mobilization, Vestibular training, Canalith repositioning, Aquatic Therapy, Dry Needling, Electrical stimulation, Spinal manipulation, Spinal mobilization, Cryotherapy, Moist heat, Traction, Ultrasound, Ionotophoresis 56m/ml Dexamethasone, and Manual therapy  PLAN FOR NEXT SESSION: Progress L shoulder ROM and strength per protocol.   JLyndel SafeHuprich PT, DPT, GCS  01/24/2022, 2:15 PM

## 2022-01-26 ENCOUNTER — Ambulatory Visit: Payer: Medicare Other

## 2022-01-26 DIAGNOSIS — M25511 Pain in right shoulder: Secondary | ICD-10-CM | POA: Diagnosis not present

## 2022-01-26 DIAGNOSIS — G8929 Other chronic pain: Secondary | ICD-10-CM | POA: Diagnosis not present

## 2022-01-26 DIAGNOSIS — M6281 Muscle weakness (generalized): Secondary | ICD-10-CM | POA: Diagnosis not present

## 2022-01-26 DIAGNOSIS — M5459 Other low back pain: Secondary | ICD-10-CM | POA: Diagnosis not present

## 2022-01-26 DIAGNOSIS — M25512 Pain in left shoulder: Secondary | ICD-10-CM | POA: Diagnosis not present

## 2022-01-26 DIAGNOSIS — R293 Abnormal posture: Secondary | ICD-10-CM | POA: Diagnosis not present

## 2022-01-26 NOTE — Therapy (Signed)
OUTPATIENT PHYSICAL THERAPY SHOULDER TREATMENT  Patient Name: [name] MRN: [id number] DOB:03/12/1955, 67 y.o., male Today's Date: 01/28/2022   PT End of Session - 01/28/22 0939     Visit Number 6    Number of Visits 25    Date for PT Re-Evaluation 03/28/22    Authorization Type eval: 01/03/22    PT Start Time 1315    PT Stop Time 1400    PT Time Calculation (min) 45 min    Activity Tolerance Patient tolerated treatment well    Behavior During Therapy [facility] for tasks assessed/performed            Past Medical History:  Diagnosis Date   Anxiety    Aortic atherosclerosis ([place])    Cardiomyopathy (in setting of Afib)    a.) TTE 12/26/2013: EF 45-50%, mild ant and antsept HK. mild MR. Mod dil LA. nl RV fxn. Rhythm was Afib; b.) TTE 07/04/2019: EF 55%, mid-apical anteroseptal HK, mild MAC, mild Ao sclerosis, G2DD, RVSP 45.3   Chronic pain syndrome    a.) followed by pain management   Chronic, continuous use of opioids    a.) hydrocodone/APAP 7.5/325 mg; followed by pain management   Coronary artery disease 11/06/2014   a.) cCTA 11/06/2014: Ca score 224 (all in pLAD) -- 74th percentile for age/sex matched control   Diverticulosis    DJD (degenerative joint disease) of knee    History of hiatal hernia    History of kidney stones 2012   Hyperlipidemia    Hyperplastic colon polyp    Hypertension    Internal hemorrhoids    Intervertebral disc disorder with radiculopathy of lumbosacral region    Long term current use FULL DOSE (325 mg) aspirin    Long term current use of amiodarone    Long term current use of antithrombotics/antiplatelets    a.) rivaroxaban   Lower extremity edema    Mixed hyperlipidemia    Myalgia due to statin    Osteoarthritis    PAF (paroxysmal atrial fibrillation) ([place])    a.) CHA2DS2VASc = 4 (age, HTN, CHF, vascular disease history);  b.) s/p DCCV 03/13/2014 (200 J x1); c.) s/p DCCV 05/15/2019 (150 J x 1, 200 J x2); d.) rate/rhythm maintained on oral  amiodarone + metoprolol succinate; chronically anticoagulated with rivaroxaban   Pernicious anemia    Prostate cancer ([place])    Pulmonary nodule, right    a. 10/2014 Cardiac CTA: 76m RLL nodule; b. 04/2015 CT Chest: stable 72mRLL nodule. No new nodules; 02/2017 CTA Chest: stable, benign, 98m40mLL pulm nodule.   Sleep difficulties    a.) takes melatonin + trazodone PRN   SVT (supraventricular tachycardia)    Tubular adenoma of colon    Past Surgical History:  Procedure Laterality Date   BICEPT TENODESIS Right 09/27/2021   Procedure: Right reverse shoulder arthroplasty, biceps tenodesis;  Surgeon: PatLeim FabryD;  Location: [facility] OR[facility];  Service: Orthopedics;  Laterality: Right;   CARDIOVERSION N/A 05/15/2019   Procedure: CARDIOVERSION;  Surgeon: GolMinna MerrittsD;  Location: [facility];  Service: Cardiovascular;  Laterality: N/A;   CARDIOVERSION N/A 03/13/2014   Procedure: CARDIOVERSION; [facility] TimIda RogueD   CARPAL TUNNEL RELEASE Right 12/23/2014   Procedure: CARPAL TUNNEL RELEASE;  Surgeon: HowEarnestine LeysD;  Location: [facility];  Service: Orthopedics;  Laterality: Right;   CARPAL TUNNEL RELEASE Left 01/06/2015   Procedure: CARPAL TUNNEL RELEASE;  Surgeon: HowEarnestine LeysD;  Location: [facility];  Service: Orthopedics;  Laterality: Left;   COLONOSCOPY WITH PROPOFOL N/A 10/08/2017   Procedure: COLONOSCOPY WITH PROPOFOL;  Surgeon: Lin Landsman, MD;  Location: [facility];  Service: Gastroenterology;  Laterality: N/A;   GASTRIC BYPASS  01/17/2000   KNEE SURGERY Right    knee trauma x3   prostate seeding     REVERSE SHOULDER ARTHROPLASTY Right 09/27/2021   Procedure: Right reverse shoulder arthroplasty, biceps tenodesis;  Surgeon: Leim Fabry, MD;  Location: [facility];  Service: Orthopedics;  Laterality: Right;   SHOULDER ARTHROSCOPY WITH ROTATOR CUFF REPAIR AND OPEN BICEPS TENODESIS Left 12/27/2021   Procedure: Left shoulder arthroscopic cuff repair  (supraspinatus and subscapularis) with Regeneten Patch application;  Surgeon: Leim Fabry, MD;  Location: [facility];  Service: Orthopedics;  Laterality: Left;   Patient Active Problem List   Diagnosis Date Noted   S/p reverse total shoulder arthroplasty 09/27/2021   Lesion of bone of lumbosacral spine (L5) 05/12/2021   Localized osteoarthritis of shoulder regions, bilateral 03/29/2021   Chronic pain of both shoulders 03/29/2021   Drug-induced myopathy 02/21/2021   Trigger finger of right hand 11/15/2020   Prostate cancer ([place]) 07/26/2020   Insomnia 08/01/2019   Primary osteoarthritis of both wrists 02/17/2019   Trigger middle finger of left hand 02/17/2019   Chronic pain syndrome 02/17/2019   Chronic radicular lumbar pain 10/08/2018   Lumbar radiculopathy 10/08/2018   Lumbar degenerative disc disease 10/08/2018   Lumbar facet arthropathy 10/08/2018   Lumbar facet joint syndrome 10/08/2018   Intervertebral disc disorder with radiculopathy of lumbosacral region    Pernicious anemia 05/22/2016   Paroxysmal atrial fibrillation ([place]) 12/20/2015   DJD (degenerative joint disease) of knee 10/27/2014   Bilateral carpal tunnel syndrome 10/27/2014   Morbid obesity with BMI of 50.0-59.9, adult ([place]) 10/27/2014   Mixed hyperlipidemia 10/27/2014   H/O gastric bypass 03/20/2014   Hyperkalemia 03/20/2014   History of prostate cancer 12/26/2013   Essential hypertension 12/26/2013   Encounter for anticoagulation discussion and counseling 12/26/2013   PCP: Olin Hauser, DO  REFERRING PROVIDER: Leim Fabry, MD  REFERRING DIAGNOSIS: Incomplete tear of left rotator cuff, unspecified whether traumatic M75.112   THERAPY DIAG: Chronic left shoulder pain  Chronic right shoulder pain  Muscle weakness (generalized)  RATIONALE FOR EVALUATION AND TREATMENT: Rehabilitation  ONSET DATE: Surgery 12/27/21, Chronic L shoulder pain for multiple years  FOLLOW UP APPT WITH PROVIDER: Yes     FROM INITIAL EVALUATION SUBJECTIVE:  Chief Complaint: S/p L shoulder RTC repair, biceps tenodesis, and shoulder debridement  Pertinent History Pt with history of chronic bilateral shoulder pain. He underwent right reverse TSR and right biceps tenodesis on 09/27/21. He experienced post-operative instability in R shoulder however it improved and he was able to avoid a revision. On 12/27/21 he underwent left arthroscopic rotator cuff repair (subscapularis (full-thickness), supraspinatus (partial-thickness) side-to-side repair + Regeneten patch), left arthroscopic biceps tenodesis, and left extensive debridement of shoulder (glenohumeral and subacromial spaces). Per surgical note other intraoperative findings include Type 2 SLAP tear, degenerative anterior labral tearing, grade 2 degenerative changes to articular cartilage of humeral head, and significant posterior capsule synovitis. No significant post-operative complications. He arrives without abduction pillow and states that Dr. Posey Pronto advised him he doesn't have to wear the abduction pillow. He got behind on his pain medication today and states that today his shoulder has been the most painful he has experienced since surgery. He also noticed swelling in L lateral upper arm this morning. He did take his pain medication at 14:00, before PT evaluation.  He has a history of chronic back pain and underwent a lumbar facet, medial branch radiofrequency ablation on 01/02/22. At the time of the evaluation pt has not yet appreciated improvement in his back pain from this procedure. He has a past medical history of Cardiomyopathy (in setting of Afib), DJD (degenerative joint disease) of knee, History of kidney stones, Intervertebral disc disorder with radiculopathy of lumbosacral  region, Kidney stone (2012), Lower extremity edema, PAF (paroxysmal atrial fibrillation) ([place]), Pernicious anemia, Prostate cancer ([place]), Pulmonary nodule, right, and SVT (supraventricular tachycardia) ([place]). He also  has a past surgical history that includes Knee surgery; Gastric bypass (2002); prostate seeding; Carpal tunnel release (Right, 12/23/2014); Carpal tunnel release (Left, 01/06/2015); Colonoscopy with propofol (N/A, 10/08/2017); and Cardioversion (N/A, 05/15/2019).   Pain:  Pain Intensity: Present: 5/10, Best: 3/10, Worst: 10/10 Pain location: Anterior L shoulder Pain Quality: aching and throbbing Radiating: No  Numbness/Tingling: No Focal Weakness: Yes History of prior shoulder or neck/shoulder injury, pain, surgery, or therapy: Yes, chronic bilateral shoulder pain with R TSR. 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 No AROM or WB through LUE (per protocol), Passive elevation and ER in staged ROM to begin at week 4 (elevation 90 degrees weeks 4-6 with no ER; 0-120 weeks 6-8 with ER 0-30 degrees). No pulleys or cane until after 6 weeks, recommend no driving x 6-8 weeks.   Weight Bearing Restrictions: Yes No WB through LUE for at least 6 weeks  Living Environment Lives with: lives with their spouse Lives in: House/apartment  Patient Goals: Improve L shoulder strength and pain. Pt would like to be able to play with/lift his grandson as well as take care of his property.   OBJECTIVE:    Patient Surveys  FOTO: To be completed QuickDASH: 47.7%  Cognition Patient is oriented to person, place, and time.  Recent memory is intact.  Remote memory is intact.  Attention span and concentration are intact.  Expressive speech is intact.  Patient's fund of knowledge is within normal limits for educational  level.    Gross Musculoskeletal Assessment Tremor: None Bulk: Mild swelling noted in L lateral upper arm. Pt denies chills, fevers, redness, or dischrage from inicisions Tone: Normal  Gait Deferred  Posture Forward head and rounded shoulders  Cervical Screen Deferred  AROM Full L wrist AROM flexion, extension, radial deviation, and ulnar deviation. MCP flexion and extension WNL. Unable to assess L shoulder and elbow AROM per protocol  PROM Full L elbow PROM flexion and extension however during first attempt at slow passive extension pt reports sudden pain in L shoulder which eventually improve after a couple minutes of rest. Full L elbow PROM pronation/supination.   LE MMT: Full L wrist strength for flexion, extension, radial deviation, and ulnar deviation. L grip strength is intact and strong. Deferred testing of L elbow and shoulder strength per protocol  Sensation Grossly intact to light touch throughout LUE as determined by testing dermatomes C2-T2. Proprioception and hot/cold testing deferred on this date.  Reflexes Deferred  Palpation Pt is generally tender to light palpation around anterior, lateral, and posterior L shoulder;  Repeated Movements Deferred  Passive Accessory Intervertebral Motion Deferred  Special Testing Deferred   TODAY'S TREATMENT    SUBJECTIVE: Pt states that he is doing well today. No significant changes since the last therapy session. Pt denies any further episodes of subluxation/dislocation of his R shoulder. He has not had to take any further pain medication specifically for his shoulders. He has an appointment to see  cardiology regarding his afib on 02/07/22.    PAIN: Bilateral shoulder pain, not asked to rate today    Ther-ex:  All exercises performed in semirecumbent position with support under both arms to prevent shoulder extension: L wrist AROM flexion, extension, radial deviation, and ulnar deviation with manual resistance 2 x 10 each direction; R shoulder flexion from neutral to 90 degrees with 3# dumbbell 10x2; R shoulder abduction to 90 degrees with 3# dumbbell 10x2; R shoulder serratus punch with 3# dumbbell 10x2; R shoulder circles with 3# dumbbell CW/CCW x 10 each direction; R elbow flexion with 3# dumbbell 10x2; R elbow extension with manual resistance from therapist 10x2; R shoulder internal rotation from forearm vertical to stomach with manual resistance from therapist 10x2; R shoulder external rotation from stomach to forearm vertical with manual resistance from therapist 10x2;   Manual Therapy  L elbow PROM flexion/extension x multiple bouts; L elbow PROM pronation/supination x multiple bouts; STM to L upper trap, L cervical paraspinals, and L levator scapulae;    PATIENT EDUCATION:  Education details:  Pt educated throughout session about proper posture and technique with exercises. Improved exercise technique, movement at target joints, use of target muscles after min to mod verbal, visual, tactile cues.  Person educated: Patient Education method: Explanation Education comprehension: verbalized understanding and returned demonstration   HOME EXERCISE PROGRAM: Access Code: [email or web address] URL: [email or web address] Date: 01/10/2022 Prepared by: Roxana Hires  Exercises - Wrist Flexion AROM  - 2 x daily - 7 x weekly - 2 sets - 10 reps - 3s hold - Wrist Extension AROM  - 2 x daily - 7 x weekly - 2 sets - 10 reps - 3s hold - Seated Scapular Retraction  - 2 x daily - 7 x weekly - 2 sets - 10 reps - 3s hold - Supported Elbow Flexion Extension PROM  - 2 x daily - 7 x  weekly - 2 sets - 10 reps - 3s hold - Forearm Supination PROM (Mirrored)  - 2 x daily -  7 x weekly - 2 sets - 10 reps - 3s hold - Forearm Pronation PROM (Mirrored)  - 2 x daily - 7 x weekly - 2 sets - 10 reps - 3s hold - Seated Gripping Towel  - 2 x daily - 7 x weekly - 2 sets - 10 reps - 3s hold   ASSESSMENT:  CLINICAL IMPRESSION: Pt demonstrates excellent motivation during session. PT continued with PROM for L elbow and AROM for L wrist/hand. Continued with R shoulder strengthening and maintained at 3# today. He reports significant muscle fatigue and weakness around repetition 10 of both sets so pt is currently not ready to progress to 4# weights. He has a follow-up appointment scheduled with cardiology to discuss his afib. Pt encouraged to continue HEP and follow-up as scheduled. Plan to progress LUE per protocol. He will benefit from skilled PT to address deficits in range of motion and strength in order to improve overall function at home and with leisure activities such as caring for his property.   REHAB POTENTIAL: Good  CLINICAL DECISION MAKING: Evolving/moderate complexity  EVALUATION COMPLEXITY: Moderate   GOALS: Goals reviewed with patient? Yes  SHORT TERM GOALS: Target date: 02/14/22  Pt will be independent with HEP to improve strength and decrease right shoulder pain to improve pain-free function at home and work. Baseline:  Goal status: INITIAL   LONG TERM GOALS: Target date: 03/28/22  Pt will increase L shoulder FOTO to at least 51 to demonstrate significant improvement in function at home and with leisure activities related to L shoulder. Baseline: 01/03/22: To be completed; 01/24/22: 4 Goal status: INITIAL  2.  Pt will decrease worst L shoulder pain by at least 3 points on the NPRS in order to demonstrate clinically significant reduction in R shoulder pain. Baseline: 01/03/22: worst: 10/10 Goal status: INITIAL  3.  Pt will decrease quick DASH score by at least 8%  in order to demonstrate clinically significant reduction in disability related to L shoulder pain        Baseline: 01/03/22: 47.7% Goal status: INITIAL  4. Pt will demonstrate L shoulder flexion, abduction, IR, and ER strength of at least 4/5 in order to demonstrate improvement in strength and function       Baseline: 01/03/22: Unable to test per protocol Goal status: INITIAL   PLAN: PT FREQUENCY: 1-2x/week  PT DURATION: 12 weeks  PLANNED INTERVENTIONS: Therapeutic exercises, Therapeutic activity, Neuromuscular re-education, Balance training, Gait training, Patient/Family education, Joint manipulation, Joint mobilization, Vestibular training, Canalith repositioning, Aquatic Therapy, Dry Needling, Electrical stimulation, Spinal manipulation, Spinal mobilization, Cryotherapy, Moist heat, Traction, Ultrasound, Ionotophoresis 47m/ml Dexamethasone, and Manual therapy  PLAN FOR NEXT SESSION: Progress L shoulder ROM and strength per protocol, continue R shoulder strengthening  JLyndel SafeHuprich PT, DPT, GCS  01/28/2022, 9:43 AM

## 2022-01-26 NOTE — Progress Notes (Signed)
Attempted to call patient for 1/15 appt PPE. No answer, left message to call office.

## 2022-01-27 NOTE — Telephone Encounter (Signed)
Copied from [place] [id number]. Topic: Referral - Request for Referral >> Jan 27, 2022 11:27 AM Everette C wrote: Has patient seen PCP for this complaint? Yes.   *If NO, is insurance requiring patient see PCP for this issue before PCP can refer them? Referral for which specialty: Gastroenterology  Preferred provider/office: Patient would like the facility to be in [facility] Reason for referral: Patient needs a colonoscopy

## 2022-01-30 ENCOUNTER — Encounter: Payer: Self-pay | Admitting: Student in an Organized Health Care Education/Training Program

## 2022-01-30 ENCOUNTER — Ambulatory Visit
Payer: Medicare Other | Attending: Student in an Organized Health Care Education/Training Program | Admitting: Student in an Organized Health Care Education/Training Program

## 2022-01-30 DIAGNOSIS — G894 Chronic pain syndrome: Secondary | ICD-10-CM | POA: Diagnosis not present

## 2022-01-30 DIAGNOSIS — M47816 Spondylosis without myelopathy or radiculopathy, lumbar region: Secondary | ICD-10-CM | POA: Diagnosis not present

## 2022-01-30 NOTE — Therapy (Incomplete)
OUTPATIENT PHYSICAL THERAPY SHOULDER TREATMENT  Patient Name: [name] MRN: [id number] DOB:[birth year], 67 y.o., male Today's Date: 01/30/2022    Past Medical History:  Diagnosis Date   Anxiety    Aortic atherosclerosis ([place])    Cardiomyopathy (in setting of Afib)    a.) TTE 12/26/2013: EF 45-50%, mild ant and antsept HK. mild MR. Mod dil LA. nl RV fxn. Rhythm was Afib; b.) TTE 07/04/2019: EF 55%, mid-apical anteroseptal HK, mild MAC, mild Ao sclerosis, G2DD, RVSP 45.3   Chronic pain syndrome    a.) followed by pain management   Chronic, continuous use of opioids    a.) hydrocodone/APAP 7.5/325 mg; followed by pain management   Coronary artery disease 11/06/2014   a.) cCTA 11/06/2014: Ca score 224 (all in pLAD) -- 74th percentile for age/sex matched control   Diverticulosis    DJD (degenerative joint disease) of knee    History of hiatal hernia    History of kidney stones 2012   Hyperlipidemia    Hyperplastic colon polyp    Hypertension    Internal hemorrhoids    Intervertebral disc disorder with radiculopathy of lumbosacral region    Long term current use FULL DOSE (325 mg) aspirin    Long term current use of amiodarone    Long term current use of antithrombotics/antiplatelets    a.) rivaroxaban   Lower extremity edema    Mixed hyperlipidemia    Myalgia due to statin    Osteoarthritis    PAF (paroxysmal atrial fibrillation) ([place])    a.) CHA2DS2VASc = 4 (age, HTN, CHF, vascular disease history);  b.) s/p DCCV 03/13/2014 (200 J x1); c.) s/p DCCV 05/15/2019 (150 J x 1, 200 J x2); d.) rate/rhythm maintained on oral amiodarone + metoprolol succinate; chronically anticoagulated with rivaroxaban   Pernicious anemia    Prostate cancer ([place])    Pulmonary nodule, right    a. 10/2014 Cardiac CTA: 76m RLL nodule; b. 04/2015 CT Chest: stable 774mRLL nodule. No new nodules; 02/2017 CTA Chest: stable, benign, 50m89mLL pulm nodule.   Sleep difficulties    a.) takes melatonin + trazodone  PRN   SVT (supraventricular tachycardia)    Tubular adenoma of colon    Past Surgical History:  Procedure Laterality Date   BICEPT TENODESIS Right 09/27/2021   Procedure: Right reverse shoulder arthroplasty, biceps tenodesis;  Surgeon: PatLeim FabryD;  Location: [facility];  Service: Orthopedics;  Laterality: Right;   CARDIOVERSION N/A 05/15/2019   Procedure: CARDIOVERSION;  Surgeon: GolMinna MerrittsD;  Location: [facility];  Service: Cardiovascular;  Laterality: N/A;   CARDIOVERSION N/A 03/13/2014   Procedure: CARDIOVERSION; [facility] Tim[facility]da Rog[facility]D   CARPAL TUNNEL RELEASE Right 12/23/2014   Procedure: CARPAL TUNNEL RELEASE;  Surgeon: HowEarnestine LeysD;  Location: [facility];  Service: Orthopedics;  Laterality: Right;   CARPAL TUNNEL RELEASE Left 01/06/2015   Procedure: CARPAL TUNNEL RELEASE;  Surgeon: HowEarnestine LeysD;  Location: [facility];  Service: Orthopedics;  Laterality: Left;   COLONOSCOPY WITH PROPOFOL N/A 10/08/2017   Procedure: COLONOSCOPY WITH PROPOFOL;  Surgeon: VanLin LandsmanD;  [facility];  Service: Gastroenterology;  Laterality: N/A;   GASTRIC BYPASS  01/17/2000   KNEE SURGERY Right    knee trauma x3   prostate seeding     REVERSE SHOULDER ARTHROPLASTY Right 09/27/2021   Procedure: Right reverse shoulder arthroplasty, biceps tenodesis;  Surgeon: PatLeim FabryD;  Location: [facility];  Service: Orthopedics;  Laterality: Right;   SHOULDER  ARTHROSCOPY WITH ROTATOR CUFF REPAIR AND OPEN BICEPS TENODESIS Left 12/27/2021   Procedure: Left shoulder arthroscopic cuff repair (supraspinatus and subscapularis) with Regeneten Patch application;  Surgeon: Leim Fabry, MD;  Location: [facility];  Service: Orthopedics;  Laterality: Left;   Patient Active Problem List   Diagnosis Date Noted   S/p reverse total shoulder arthroplasty 09/27/2021   Lesion of bone of lumbosacral spine (L5) 05/12/2021   Localized osteoarthritis of shoulder regions,  bilateral 03/29/2021   Chronic pain of both shoulders 03/29/2021   Drug-induced myopathy 02/21/2021   Trigger finger of right hand 11/15/2020   Prostate cancer ([place]) 07/26/2020   Insomnia 08/01/2019   Primary osteoarthritis of both wrists 02/17/2019   Trigger middle finger of left hand 02/17/2019   Chronic pain syndrome 02/17/2019   Chronic radicular lumbar pain 10/08/2018   Lumbar radiculopathy 10/08/2018   Lumbar degenerative disc disease 10/08/2018   Lumbar facet arthropathy 10/08/2018   Lumbar facet joint syndrome 10/08/2018   Intervertebral disc disorder with radiculopathy of lumbosacral region    Pernicious anemia 05/22/2016   Paroxysmal atrial fibrillation ([place]) 12/20/2015   DJD (degenerative joint disease) of knee 10/27/2014   Bilateral carpal tunnel syndrome 10/27/2014   Morbid obesity with BMI of 50.0-59.9, adult ([place]) 10/27/2014   Mixed hyperlipidemia 10/27/2014   H/O gastric bypass 03/20/2014   Hyperkalemia 03/20/2014   History of prostate cancer 12/26/2013   Essential hypertension 12/26/2013   Encounter for anticoagulation discussion and counseling 12/26/2013   PCP: Olin Hauser, DO  REFERRING PROVIDER: Nobie Putnam *  REFERRING DIAGNOSIS: Incomplete tear of left rotator cuff, unspecified whether traumatic M75.112   THERAPY DIAG: Chronic left shoulder pain  Chronic right shoulder pain  Muscle weakness (generalized)  RATIONALE FOR EVALUATION AND TREATMENT: Rehabilitation  ONSET DATE: Surgery 12/27/21, Chronic L shoulder pain for multiple years  FOLLOW UP APPT WITH PROVIDER: Yes    FROM INITIAL EVALUATION SUBJECTIVE:                                                                                                                                                                                         Chief Complaint: S/p L shoulder RTC repair, biceps tenodesis, and shoulder debridement  Pertinent History Pt with history of chronic  bilateral shoulder pain. He underwent right reverse TSR and right biceps tenodesis on 09/27/21. He experienced post-operative instability in R shoulder however it improved and he was able to avoid a revision. On 12/27/21 he underwent left arthroscopic rotator cuff repair (subscapularis (full-thickness), supraspinatus (partial-thickness) side-to-side repair + Regeneten patch), left arthroscopic biceps tenodesis, and left extensive debridement of shoulder (glenohumeral and subacromial spaces). Per surgical note  other intraoperative findings include Type 2 SLAP tear, degenerative anterior labral tearing, grade 2 degenerative changes to articular cartilage of humeral head, and significant posterior capsule synovitis. No significant post-operative complications. He arrives without abduction pillow and states that Dr. Posey Pronto advised him he doesn't have to wear the abduction pillow. He got behind on his pain medication today and states that today his shoulder has been the most painful he has experienced since surgery. He also noticed swelling in L lateral upper arm this morning. He did take his pain medication at 14:00, before PT evaluation.  He has a history of chronic back pain and underwent a lumbar facet, medial branch radiofrequency ablation on 01/02/22. At the time of the evaluation pt has not yet appreciated improvement in his back pain from this procedure. He has a past medical history of Cardiomyopathy (in setting of Afib), DJD (degenerative joint disease) of knee, History of kidney stones, Intervertebral disc disorder with radiculopathy of lumbosacral region, Kidney stone (2012), Lower extremity edema, PAF (paroxysmal atrial fibrillation) ([place]), Pernicious anemia, Prostate cancer ([place]), Pulmonary nodule, right, and SVT (supraventricular tachycardia) ([place]). He also  has a past surgical history that includes Knee surgery; Gastric bypass (2002); prostate seeding; Carpal tunnel release (Right, 12/23/2014); Carpal tunnel  release (Left, 01/06/2015); Colonoscopy with propofol (N/A, 10/08/2017); and Cardioversion (N/A, 05/15/2019).   Pain:  Pain Intensity: Present: 5/10, Best: 3/10, Worst: 10/10 Pain location: Anterior L shoulder Pain Quality: aching and throbbing Radiating: No  Numbness/Tingling: No Focal Weakness: Yes History of prior shoulder or neck/shoulder injury, pain, surgery, or therapy: Yes, chronic bilateral shoulder pain with R TSR. 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 No AROM or WB through LUE (per protocol), Passive elevation and ER in staged ROM to begin at week 4 (elevation 90 degrees weeks 4-6 with no ER; 0-120 weeks 6-8 with ER 0-30 degrees). No pulleys or cane until after 6 weeks, recommend no driving x 6-8 weeks.   Weight Bearing Restrictions: Yes No WB through LUE for at least 6 weeks  Living Environment Lives with: lives with their spouse Lives in: House/apartment  Patient Goals: Improve L shoulder strength and pain. Pt would like to be able to play with/lift his grandson as well as take care of his property.   OBJECTIVE:   Patient Surveys  FOTO: To be completed QuickDASH: 47.7%  Cognition Patient is oriented to person, place, and time.  Recent memory is intact.  Remote memory is intact.  Attention span and concentration are intact.  Expressive speech is intact.  Patient's fund of knowledge is within normal limits for educational level.    Gross Musculoskeletal  Assessment Tremor: None Bulk: Mild swelling noted in L lateral upper arm. Pt denies chills, fevers, redness, or dischrage from inicisions Tone: Normal  Gait Deferred  Posture Forward head and rounded shoulders  Cervical Screen Deferred  AROM Full L wrist AROM flexion, extension, radial deviation, and ulnar deviation. MCP flexion and extension WNL. Unable to assess L shoulder and elbow AROM per protocol  PROM Full L elbow PROM flexion and extension however during first attempt at slow passive  extension pt reports sudden pain in L shoulder which eventually improve after a couple minutes of rest. Full L elbow PROM pronation/supination.   LE MMT: Full L wrist strength for flexion, extension, radial deviation, and ulnar deviation. L grip strength is intact and strong. Deferred testing of L elbow and shoulder strength per protocol  Sensation Grossly intact to light touch throughout LUE as determined by testing dermatomes C2-T2. Proprioception and hot/cold testing deferred on this date.  Reflexes Deferred  Palpation Pt is generally tender to light palpation around anterior, lateral, and posterior L shoulder;  Repeated Movements Deferred  Passive Accessory Intervertebral Motion Deferred  Special Testing Deferred   TODAY'S TREATMENT    SUBJECTIVE: Pt states that he is doing well today. No significant changes since the last therapy session. Pt denies any further episodes of subluxation/dislocation of his R shoulder. He has not had to take any further pain medication specifically for his shoulders. He has an appointment to see cardiology regarding his afib on 02/07/22.    PAIN: Bilateral shoulder pain, not asked to rate today    Ther-ex:  All exercises performed in semirecumbent position with support under both arms to prevent shoulder extension: L wrist AROM flexion, extension, radial deviation, and ulnar deviation with manual resistance 2 x 10 each direction; R shoulder  flexion from neutral to 90 degrees with 3# dumbbell 10x2; R shoulder abduction to 90 degrees with 3# dumbbell 10x2; R shoulder serratus punch with 3# dumbbell 10x2; R shoulder circles with 3# dumbbell CW/CCW x 10 each direction; R elbow flexion with 3# dumbbell 10x2; R elbow extension with manual resistance from therapist 10x2; R shoulder internal rotation from forearm vertical to stomach with manual resistance from therapist 10x2; R shoulder external rotation from stomach to forearm vertical with manual resistance from therapist 10x2;   Manual Therapy  L elbow PROM flexion/extension x multiple bouts; L elbow PROM pronation/supination x multiple bouts; STM to L upper trap, L cervical paraspinals, and L levator scapulae;    PATIENT EDUCATION:  Education details:  Pt educated throughout session about proper posture and technique with exercises. Improved exercise technique, movement at target joints, use of target muscles after min to mod verbal, visual, tactile cues.  Person educated: Patient Education method: Explanation Education comprehension: verbalized understanding and returned demonstration   HOME EXERCISE PROGRAM: Access Code: [email or web address] URL: [email or web address] Date: 01/10/2022 Prepared by: Roxana Hires  Exercises - Wrist Flexion AROM  - 2 x daily - 7 x weekly - 2 sets - 10 reps - 3s hold - Wrist Extension AROM  - 2 x daily - 7 x weekly - 2 sets - 10 reps - 3s hold - Seated Scapular Retraction  - 2 x daily - 7 x weekly - 2 sets - 10 reps - 3s hold - Supported Elbow Flexion Extension PROM  - 2 x daily - 7 x weekly - 2 sets - 10 reps - 3s hold - Forearm Supination PROM (Mirrored)  - 2 x daily - 7 x weekly - 2 sets - 10 reps - 3s hold - Forearm Pronation PROM (Mirrored)  - 2 x daily - 7 x weekly - 2 sets - 10 reps - 3s hold - Seated Gripping Towel  - 2 x daily - 7 x weekly - 2 sets - 10 reps - 3s hold   ASSESSMENT:  CLINICAL IMPRESSION: Pt demonstrates  excellent motivation during session. PT continued with PROM for L elbow and AROM for L wrist/hand. Continued with R shoulder strengthening  and maintained at 3# today. He reports significant muscle fatigue and weakness around repetition 10 of both sets so pt is currently not ready to progress to 4# weights. He has a follow-up appointment scheduled with cardiology to discuss his afib. Pt encouraged to continue HEP and follow-up as scheduled. Plan to progress LUE per protocol. He will benefit from skilled PT to address deficits in range of motion and strength in order to improve overall function at home and with leisure activities such as caring for his property.   REHAB POTENTIAL: Good  CLINICAL DECISION MAKING: Evolving/moderate complexity  EVALUATION COMPLEXITY: Moderate   GOALS: Goals reviewed with patient? Yes  SHORT TERM GOALS: Target date: 02/14/22  Pt will be independent with HEP to improve strength and decrease right shoulder pain to improve pain-free function at home and work. Baseline:  Goal status: INITIAL   LONG TERM GOALS: Target date: 03/28/22  Pt will increase L shoulder FOTO to at least 51 to demonstrate significant improvement in function at home and with leisure activities related to L shoulder. Baseline: 01/03/22: To be completed; 01/24/22: 4 Goal status: INITIAL  2.  Pt will decrease worst L shoulder pain by at least 3 points on the NPRS in order to demonstrate clinically significant reduction in R shoulder pain. Baseline: 01/03/22: worst: 10/10 Goal status: INITIAL  3.  Pt will decrease quick DASH score by at least 8% in order to demonstrate clinically significant reduction in disability related to L shoulder pain        Baseline: 01/03/22: 47.7% Goal status: INITIAL  4. Pt will demonstrate L shoulder flexion, abduction, IR, and ER strength of at least 4/5 in order to demonstrate improvement in strength and function       Baseline: 01/03/22: Unable to test per  protocol Goal status: INITIAL   PLAN: PT FREQUENCY: 1-2x/week  PT DURATION: 12 weeks  PLANNED INTERVENTIONS: Therapeutic exercises, Therapeutic activity, Neuromuscular re-education, Balance training, Gait training, Patient/Family education, Joint manipulation, Joint mobilization, Vestibular training, Canalith repositioning, Aquatic Therapy, Dry Needling, Electrical stimulation, Spinal manipulation, Spinal mobilization, Cryotherapy, Moist heat, Traction, Ultrasound, Ionotophoresis 62m/ml Dexamethasone, and Manual therapy  PLAN FOR NEXT SESSION: Progress L shoulder ROM and strength per protocol, continue R shoulder strengthening  JLyndel SafeHuprich PT, DPT, GCS  01/30/2022, 10:47 AM

## 2022-01-30 NOTE — Progress Notes (Signed)
Patient: [name]  Service Category: E/M  Provider: Gillis Santa, MD  DOB: [birth year]  DOS: 01/30/2022  Location: Office  MRN: [id number]  Setting: Ambulatory outpatient  Referring Provider: Nobie Putnam *  Type: Established Patient  Specialty: Interventional Pain Management  PCP: Olin Hauser, DO  Location: Remote location  Delivery: TeleHealth     Virtual Encounter - Pain Management PROVIDER NOTE: Information contained herein reflects review and annotations entered in association with encounter. Interpretation of such information and data should be left to medically-trained personnel. Information provided to patient can be located elsewhere in the medical record under "Patient Instructions". Document created using STT-dictation technology, any transcriptional errors that may result from process are unintentional.    Contact & Pharmacy Preferred: [phone] Home: [phone] (home) Mobile: [phone] (mobile) E-mail: [email or web address]  [id number] [place], [place] [address] AT [facility] [address] [place] [address] [place] Phone: [phone] Fax: [phone]   Pre-screening  [name] offered "in-person" vs "virtual" encounter. He indicated preferring virtual for this encounter.   Reason COVID-19*  Social distancing based on CDC and AMA recommendations.   I contacted [phone] on 01/30/2022 via telephone.      I clearly identified myself as 02/12/2022, MD. I verified that I was speaking with the correct person using two identifiers (Name: [name], and date of birth: 07/10/1955).  Consent I sought verbal advanced consent from 08/28/1955 for virtual visit interactions. I informed [name] of possible security and privacy concerns, risks, and limitations associated with providing "not-in-person" medical evaluation and management services. I also informed [name] of the availability of "in-person"  appointments. Finally, I informed him that there would be a charge for the virtual visit and that he could be  personally, fully or partially, financially responsible for it. [name] expressed understanding and agreed to proceed.   Historic Elements   [name] is a 67 y.o. year old, male patient evaluated today after our last contact on 01/02/2022. [name]  has a past medical history of Anxiety, Aortic atherosclerosis ([place]), Cardiomyopathy (in setting of Afib), Chronic pain syndrome, Chronic, continuous use of opioids, Coronary artery disease (11/06/2014), Diverticulosis, DJD (degenerative joint disease) of knee, History of hiatal hernia, History of kidney stones (2012), Hyperlipidemia, Hyperplastic colon polyp, Hypertension, Internal hemorrhoids, Intervertebral disc disorder with radiculopathy of lumbosacral region, Long term current use FULL DOSE (325 mg) aspirin, Long term current use of amiodarone, Long term current use of antithrombotics/antiplatelets, Lower extremity edema, Mixed hyperlipidemia, Myalgia due to statin, Osteoarthritis, PAF (paroxysmal atrial fibrillation) ([place]), Pernicious anemia, Prostate cancer ([place]), Pulmonary nodule, right, Sleep difficulties, SVT (supraventricular tachycardia), and Tubular adenoma of colon. He also  has a past surgical history that includes Knee surgery (Right); Gastric bypass (01/17/2000); prostate seeding; Carpal tunnel release (Right, 12/23/2014); Carpal tunnel release (Left, 01/06/2015); Colonoscopy with propofol (N/A, 10/08/2017); Cardioversion (N/A, 05/15/2019); Cardioversion (N/A, 03/13/2014); Reverse shoulder arthroplasty (Right, 09/27/2021); Bicept tenodesis (Right, 09/27/2021); and Shoulder arthroscopy with rotator cuff repair and open biceps tenodesis (Left, 12/27/2021). [name] has a current medication list which includes the following prescription(s): acetaminophen, amiodarone, aspirin ec, benazepril, buspirone, carboxymethylcellulose,  cyanocobalamin, cyclobenzaprine, duloxetine, hydrocodone-acetaminophen, melatonin, metoprolol succinate, multivitamin with minerals, ondansetron, oxycodone, xarelto, trazodone, and amlodipine. He  reports that he quit smoking about 42 years ago. His smoking use included cigarettes. He has a 10.00 pack-year smoking history. He has quit using smokeless tobacco.  His smokeless tobacco use  included snuff. He reports that he does not currently use alcohol. He reports that he does not use drugs. [name] has No Known Allergies.  Estimated body mass index is 43.4 kg/m as calculated from the following:   Height as of 01/02/22: 6' (1.829 m).   Weight as of 01/02/22: 320 lb (145.2 kg).  HPI  Today, he is being contacted for a post-procedure assessment.   Post-procedure evaluation   Type: Lumbar Facet, Medial Branch Radiofrequency Ablation (RFA) #2  Laterality: Right  Level: L3, L4, L5, Medial Branch Level(s). These levels will denervate the L3-4 and L5-S1 lumbar facet joints.  Imaging: Fluoroscopic guidance Anesthesia: Local anesthesia (1-2% Lidocaine) Anxiolysis: Valium 5 mg p.o. DOS: 01/02/2022  Performed by: Gillis Santa, MD  Purpose: Therapeutic/Palliative Indications: Low back pain severe enough to impact quality of life or function. Indications: 1. Lumbar facet arthropathy   2. Lumbar facet joint syndrome   3. Chronic pain syndrome    [name] has been dealing with the above chronic pain for longer than three months and has either failed to respond, was unable to tolerate, or simply did not get enough benefit from other more conservative therapies including, but not limited to: 1. Over-the-counter medications 2. Anti-inflammatory medications 3. Muscle relaxants 4. Membrane stabilizers 5. Opioids 6. Physical therapy and/or chiropractic manipulation 7. Modalities (Heat, ice, etc.) 8. Invasive techniques such as nerve blocks. [name] has attained more than 50% relief of the pain  from a series of diagnostic injections conducted in separate occasions.  Pain Score: Pre-procedure: 2 /10 Post-procedure: 0-No pain/10     Effectiveness:  Initial hour after procedure: 100 %  Subsequent 4-6 hours post-procedure: 100 %  Analgesia past initial 6 hours: 75 % (current)  Ongoing improvement:  Analgesic:  75% Function: [name] reports improvement in function ROM: [name] reports improvement in ROM   Pharmacotherapy Assessment   Opioid Analgesic: Tramadol 50 mg 4 times daily as needed, quantity 120/month; MME equals 20    Monitoring: [place] PMP: PDMP reviewed during this encounter.       Pharmacotherapy: No side-effects or adverse reactions reported. Compliance: No problems identified. Effectiveness: Clinically acceptable. Plan: Refer to "POC". UDS:  Summary  Date Value Ref Range Status  08/23/2021 Note  Final    Comment:    ==================================================================== [employer] [facility] [employer] (MW) ==================================================================== Test                             Result       Flag       Units  Drug Present and Declared for Prescription Verification   Hydrocodone                    4372         EXPECTED   ng/mg creat   Hydromorphone                  98           EXPECTED   ng/mg creat   Dihydrocodeine                 478          EXPECTED   ng/mg creat   Norhydrocodone                 >2488        EXPECTED   ng/mg creat    Sources of hydrocodone include scheduled  prescription medications.    Hydromorphone, dihydrocodeine and norhydrocodone are expected    metabolites of hydrocodone. Hydromorphone and dihydrocodeine are    also available as scheduled prescription medications.  ==================================================================== Test                      Result    Flag   Units      Ref Range   Creatinine              201              mg/dL       >=20 ==================================================================== Declared Medications:  The flagging and interpretation on this report are based on the  following declared medications.  Unexpected results may arise from  inaccuracies in the declared medications.   **Note: The testing scope of this panel includes these medications:   Hydrocodone (Norco)   **Note: The testing scope of this panel does not include the  following reported medications:   Acetaminophen (Norco)  Amiodarone (Pacerone)  Amlodipine (Norvasc)  Aspirin  Benazepril (Lotensin)  Buspirone (Buspar)  Cyclobenzaprine (Flexeril)  Duloxetine (Cymbalta)  Eye Drops  Furosemide (Lasix)  Melatonin  Metoprolol (Toprol)  Multivitamin  Rivaroxaban (Xarelto)  Supplement  Tamsulosin (Flomax)  Trazodone (Desyrel)  Vitamin B12 ==================================================================== For clinical consultation, please call [phone]. ====================================================================    No results found for: "CBDTHCR", "D8THCCBX", "D9THCCBX"   Laboratory Chemistry Profile   Renal Lab Results  Component Value Date   BUN 16 09/28/2021   CREATININE 0.96 82/99/3716   BCR NOT APPLICABLE 96/78/9381   GFRAA 72 07/21/2020   GFRNONAA >60 09/28/2021    Hepatic Lab Results  Component Value Date   AST 12 07/27/2021   ALT 10 07/27/2021   ALBUMIN 4.6 08/01/2019   ALKPHOS 72 08/01/2019   LIPASE 28 03/13/2017    Electrolytes Lab Results  Component Value Date   NA 141 09/28/2021   K 4.3 09/28/2021   CL 111 09/28/2021   CALCIUM 8.1 (L) 09/28/2021   MG 2.0 02/01/2021    Bone Lab Results  Component Value Date   25OHVITD1 32 02/01/2021   25OHVITD2 <1.0 02/01/2021   25OHVITD3 32 02/01/2021    Inflammation (CRP: Acute Phase) (ESR: Chronic Phase) No results found for: "CRP", "ESRSEDRATE", "LATICACIDVEN"       Note: Above Lab results reviewed.    Assessment  The  primary encounter diagnosis was Lumbar facet joint syndrome. Diagnoses of Lumbar facet arthropathy and Chronic pain syndrome were also pertinent to this visit.  Plan of Care  Significant improvement in right axial low back pain after right L3, L4, L5 RFA performed 01/02/2022.  He continues to work with Corene Cornea and physical therapy for his shoulders.  He has an upcoming appointment at the end of January with me for medication management.  Follow-up plan:   Return for Keep sch. appt.     s/p lesi #1 (left L4/5) on 8/17, #2 on 10/21/2018 (left L3/4), #3 on 12/09/2018 (Left L3/4); 02/24/2019: Right wrist injection and left trigger finger (middle finger) injection.,  Bilateral wrist injection 10/15/2019, bilateral knee Hyalgan No. 1 01/26/1960. #2 02/23/2020, #3 03/24/2020.  Endorsing approximately 75 to 80% pain relief for bilateral knee pain.  Lumbar L3-5 RFA, Left: 02/16/21, Right 03/09/21, 01/02/22         Recent Visits Date Type Provider Dept  01/02/22 Procedure visit Gillis Santa, [facility]  12/07/21 Procedure visit Gillis Santa, [facility]  11/15/21 Office  Visit Gillis Santa, MD [facility]  Showing recent visits within past 90 days and meeting all other requirements Today's Visits Date Type Provider Dept  01/30/22 Office Visit Gillis Santa, MD [facility]  Showing today's visits and meeting all other requirements Future Appointments Date Type Provider Dept  02/07/22 Appointment Gillis Santa, MD [facility]  Showing future appointments within next 90 days and meeting all other requirements  I discussed the assessment and treatment plan with the patient. The patient was provided an opportunity to ask questions and all were answered. The patient agreed with the plan and demonstrated an understanding of the instructions.  Patient advised to call back or seek an in-person evaluation if the symptoms or condition worsens.  Duration of  encounter: 2mnutes.  Note by: BGillis Santa MD Date: 01/30/2022; Time: 3:33 PM

## 2022-01-31 ENCOUNTER — Ambulatory Visit: Payer: Medicare Other

## 2022-01-31 ENCOUNTER — Ambulatory Visit: Payer: Medicare Other | Admitting: Family Medicine

## 2022-01-31 DIAGNOSIS — G8929 Other chronic pain: Secondary | ICD-10-CM

## 2022-01-31 DIAGNOSIS — M6281 Muscle weakness (generalized): Secondary | ICD-10-CM

## 2022-02-02 ENCOUNTER — Ambulatory Visit: Payer: Medicare Other

## 2022-02-02 DIAGNOSIS — G8929 Other chronic pain: Secondary | ICD-10-CM | POA: Diagnosis not present

## 2022-02-02 DIAGNOSIS — M6281 Muscle weakness (generalized): Secondary | ICD-10-CM | POA: Diagnosis not present

## 2022-02-02 DIAGNOSIS — R293 Abnormal posture: Secondary | ICD-10-CM | POA: Diagnosis not present

## 2022-02-02 DIAGNOSIS — M25511 Pain in right shoulder: Secondary | ICD-10-CM | POA: Diagnosis not present

## 2022-02-02 DIAGNOSIS — M25512 Pain in left shoulder: Secondary | ICD-10-CM | POA: Diagnosis not present

## 2022-02-02 DIAGNOSIS — M5459 Other low back pain: Secondary | ICD-10-CM | POA: Diagnosis not present

## 2022-02-02 NOTE — Therapy (Signed)
OUTPATIENT PHYSICAL THERAPY SHOULDER TREATMENT  Patient Name: [name] MRN: [id number] DOB:[birth year], 67 y.o., male Today's Date: 02/03/2022   PT End of Session - 02/02/22 1324     Visit Number 7    Number of Visits 25    Date for PT Re-Evaluation 03/28/22    Authorization Type eval: 01/03/22    PT Start Time 8315    PT Stop Time 1400    PT Time Calculation (min) 43 min    Activity Tolerance Patient tolerated treatment well    Behavior During Therapy WFL for tasks assessed/performed            Past Medical History:  Diagnosis Date   Anxiety    Aortic atherosclerosis ([place])    Cardiomyopathy (in setting of Afib)    a.) TTE 12/26/2013: EF 45-50%, mild ant and antsept HK. mild MR. Mod dil LA. nl RV fxn. Rhythm was Afib; b.) TTE 07/04/2019: EF 55%, mid-apical anteroseptal HK, mild MAC, mild Ao sclerosis, G2DD, RVSP 45.3   Chronic pain syndrome    a.) followed by pain management   Chronic, continuous use of opioids    a.) hydrocodone/APAP 7.5/325 mg; followed by pain management   Coronary artery disease 11/06/2014   a.) cCTA 11/06/2014: Ca score 224 (all in pLAD) -- 74th percentile for age/sex matched control   Diverticulosis    DJD (degenerative joint disease) of knee    History of hiatal hernia    History of kidney stones 2012   Hyperlipidemia    Hyperplastic colon polyp    Hypertension    Internal hemorrhoids    Intervertebral disc disorder with radiculopathy of lumbosacral region    Long term current use FULL DOSE (325 mg) aspirin    Long term current use of amiodarone    Long term current use of antithrombotics/antiplatelets    a.) rivaroxaban   Lower extremity edema    Mixed hyperlipidemia    Myalgia due to statin    Osteoarthritis    PAF (paroxysmal atrial fibrillation) ([place])    a.) CHA2DS2VASc = 4 (age, HTN, CHF, vascular disease history);  b.) s/p DCCV 03/13/2014 (200 J x1); c.) s/p DCCV 05/15/2019 (150 J x 1, 200 J x2); d.) rate/rhythm maintained on oral  amiodarone + metoprolol succinate; chronically anticoagulated with rivaroxaban   Pernicious anemia    Prostate cancer ([place])    Pulmonary nodule, right    a. 10/2014 Cardiac CTA: 40m RLL nodule; b. 04/2015 CT Chest: stable 732mRLL nodule. No new nodules; 02/2017 CTA Chest: stable, benign, 52m32mLL pulm nodule.   Sleep difficulties    a.) takes melatonin + trazodone PRN   SVT (supraventricular tachycardia)    Tubular adenoma of colon    Past Surgical History:  Procedure Laterality Date   BICEPT TENODESIS Right 09/27/2021   Procedure: Right reverse shoulder arthroplasty, biceps tenodesis;  Surgeon: PatLeim FabryD;  Location: [facility] OR[facility];  Service: Orthopedics;  Laterality: Right;   CARDIOVERSION N/A 05/15/2019   Procedure: CARDIOVERSION;  Surgeon: GolMinna MerrittsD;  Location: [facility];  Service: Cardiovascular;  Laterality: N/A;   CARDIOVERSION N/A 03/13/2014   Procedure: CARDIOVERSION; [facility] TimIda RogueD   CARPAL TUNNEL RELEASE Right 12/23/2014   Procedure: CARPAL TUNNEL RELEASE;  Surgeon: HowEarnestine LeysD;  Location: [facility];  Service: Orthopedics;  Laterality: Right;   CARPAL TUNNEL RELEASE Left 01/06/2015   Procedure: CARPAL TUNNEL RELEASE;  Surgeon: HowEarnestine LeysD;  Location: [facility];  Service: Orthopedics;  Laterality: Left;   COLONOSCOPY WITH PROPOFOL N/A 10/08/2017   Procedure: COLONOSCOPY WITH PROPOFOL;  Surgeon: Lin Landsman, MD;  Location: [facility];  Service: Gastroenterology;  Laterality: N/A;   GASTRIC BYPASS  01/17/2000   KNEE SURGERY Right    knee trauma x3   prostate seeding     REVERSE SHOULDER ARTHROPLASTY Right 09/27/2021   Procedure: Right reverse shoulder arthroplasty, biceps tenodesis;  Surgeon: Leim Fabry, MD;  Location: [facility];  Service: Orthopedics;  Laterality: Right;   SHOULDER ARTHROSCOPY WITH ROTATOR CUFF REPAIR AND OPEN BICEPS TENODESIS Left 12/27/2021   Procedure: Left shoulder arthroscopic cuff repair  (supraspinatus and subscapularis) with Regeneten Patch application;  Surgeon: Leim Fabry, MD;  Location: [facility];  Service: Orthopedics;  Laterality: Left;   Patient Active Problem List   Diagnosis Date Noted   S/p reverse total shoulder arthroplasty 09/27/2021   Lesion of bone of lumbosacral spine (L5) 05/12/2021   Localized osteoarthritis of shoulder regions, bilateral 03/29/2021   Chronic pain of both shoulders 03/29/2021   Drug-induced myopathy 02/21/2021   Trigger finger of right hand 11/15/2020   Prostate cancer ([place]) 07/26/2020   Insomnia 08/01/2019   Primary osteoarthritis of both wrists 02/17/2019   Trigger middle finger of left hand 02/17/2019   Chronic pain syndrome 02/17/2019   Chronic radicular lumbar pain 10/08/2018   Lumbar radiculopathy 10/08/2018   Lumbar degenerative disc disease 10/08/2018   Lumbar facet arthropathy 10/08/2018   Lumbar facet joint syndrome 10/08/2018   Intervertebral disc disorder with radiculopathy of lumbosacral region    Pernicious anemia 05/22/2016   Paroxysmal atrial fibrillation ([place]) 12/20/2015   DJD (degenerative joint disease) of knee 10/27/2014   Bilateral carpal tunnel syndrome 10/27/2014   Morbid obesity with BMI of 50.0-59.9, adult ([place]) 10/27/2014   Mixed hyperlipidemia 10/27/2014   H/O gastric bypass 03/20/2014   Hyperkalemia 03/20/2014   History of prostate cancer 12/26/2013   Essential hypertension 12/26/2013   Encounter for anticoagulation discussion and counseling 12/26/2013   PCP: Olin Hauser, DO  REFERRING PROVIDER: Leim Fabry, MD  REFERRING DIAGNOSIS: Incomplete tear of left rotator cuff, unspecified whether traumatic M75.112   THERAPY DIAG: Chronic left shoulder pain  Chronic right shoulder pain  Muscle weakness (generalized)  RATIONALE FOR EVALUATION AND TREATMENT: Rehabilitation  ONSET DATE: Surgery 12/27/21, Chronic L shoulder pain for multiple years  FOLLOW UP APPT WITH PROVIDER: Yes     FROM INITIAL EVALUATION SUBJECTIVE:  Chief Complaint: S/p L shoulder RTC repair, biceps tenodesis, and shoulder debridement  Pertinent History Pt with history of chronic bilateral shoulder pain. He underwent right reverse TSR and right biceps tenodesis on 09/27/21. He experienced post-operative instability in R shoulder however it improved and he was able to avoid a revision. On 12/27/21 he underwent left arthroscopic rotator cuff repair (subscapularis (full-thickness), supraspinatus (partial-thickness) side-to-side repair + Regeneten patch), left arthroscopic biceps tenodesis, and left extensive debridement of shoulder (glenohumeral and subacromial spaces). Per surgical note other intraoperative findings include Type 2 SLAP tear, degenerative anterior labral tearing, grade 2 degenerative changes to articular cartilage of humeral head, and significant posterior capsule synovitis. No significant post-operative complications. He arrives without abduction pillow and states that Dr. Posey Pronto advised him he doesn't have to wear the abduction pillow. He got behind on his pain medication today and states that today his shoulder has been the most painful he has experienced since surgery. He also noticed swelling in L lateral upper arm this morning. He did take his pain medication at 14:00, before PT evaluation.  He has a history of chronic back pain and underwent a lumbar facet, medial branch radiofrequency ablation on 01/02/22. At the time of the evaluation pt has not yet appreciated improvement in his back pain from this procedure. He has a past medical history of Cardiomyopathy (in setting of Afib), DJD (degenerative joint disease) of knee, History of kidney stones, Intervertebral disc disorder with radiculopathy of lumbosacral  region, Kidney stone (2012), Lower extremity edema, PAF (paroxysmal atrial fibrillation) ([place]), Pernicious anemia, Prostate cancer ([place]), Pulmonary nodule, right, and SVT (supraventricular tachycardia) ([place]). He also  has a past surgical history that includes Knee surgery; Gastric bypass (2002); prostate seeding; Carpal tunnel release (Right, 12/23/2014); Carpal tunnel release (Left, 01/06/2015); Colonoscopy with propofol (N/A, 10/08/2017); and Cardioversion (N/A, 05/15/2019).   Pain:  Pain Intensity: Present: 5/10, Best: 3/10, Worst: 10/10 Pain location: Anterior L shoulder Pain Quality: aching and throbbing Radiating: No  Numbness/Tingling: No Focal Weakness: Yes History of prior shoulder or neck/shoulder injury, pain, surgery, or therapy: Yes, chronic bilateral shoulder pain with R TSR. 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 No AROM or WB through LUE (per protocol), Passive elevation and ER in staged ROM to begin at week 4 (elevation 90 degrees weeks 4-6 with no ER; 0-120 weeks 6-8 with ER 0-30 degrees). No pulleys or cane until after 6 weeks, recommend no driving x 6-8 weeks.   Weight Bearing Restrictions: Yes No WB through LUE for at least 6 weeks  Living Environment Lives with: lives with their spouse Lives in: House/apartment  Patient Goals: Improve L shoulder strength and pain. Pt would like to be able to play with/lift his grandson as well as take care of his property.   OBJECTIVE:    Patient Surveys  FOTO: To be completed QuickDASH: 47.7%  Cognition Patient is oriented to person, place, and time.  Recent memory is intact.  Remote memory is intact.  Attention span and concentration are intact.  Expressive speech is intact.  Patient's fund of knowledge is within normal limits for educational  level.    Gross Musculoskeletal Assessment Tremor: None Bulk: Mild swelling noted in L lateral upper arm. Pt denies chills, fevers, redness, or dischrage from inicisions Tone: Normal  Gait Deferred  Posture Forward head and rounded shoulders  Cervical Screen Deferred  AROM Full L wrist AROM flexion, extension, radial deviation, and ulnar deviation. MCP flexion and extension WNL. Unable to assess L shoulder and elbow AROM per protocol  PROM Full L elbow PROM flexion and extension however during first attempt at slow passive extension pt reports sudden pain in L shoulder which eventually improve after a couple minutes of rest. Full L elbow PROM pronation/supination.   LE MMT: Full L wrist strength for flexion, extension, radial deviation, and ulnar deviation. L grip strength is intact and strong. Deferred testing of L elbow and shoulder strength per protocol  Sensation Grossly intact to light touch throughout LUE as determined by testing dermatomes C2-T2. Proprioception and hot/cold testing deferred on this date.  Reflexes Deferred  Palpation Pt is generally tender to light palpation around anterior, lateral, and posterior L shoulder;  Repeated Movements Deferred  Passive Accessory Intervertebral Motion Deferred  Special Testing Deferred   TODAY'S TREATMENT    SUBJECTIVE: Pt states that he had a lot of soreness the evening after his last therapy session and for multiple days afterwards. He has had to return to taking his pain medication more frequently due to the pain. He denies any further episodes of subluxation/dislocation of his R shoulder. No  acute trauma reported to left shoulder. He has also been having bilateral hand numbness. Pt with prior history of carpal tunnel surgery bilaterally.   PAIN: Bilateral shoulder pain, not asked to rate today    Ther-ex:  All exercises performed in semirecumbent position with support under both arms to prevent shoulder extension: L wrist AROM flexion, extension, radial deviation, and ulnar deviation with manual resistance 2 x 10 each direction with the exception of only 1 set for wrist flexion due to increase in L shoulder pain; R shoulder flexion from neutral to 90 degrees with 2# dumbbell x 10; R shoulder serratus punch with 2# dumbbell x 10; R shoulder circles with 2# dumbbell CW/CCW x 10 each direction; R elbow flexion with 2# dumbbell 2 x 10; R elbow extension with manual resistance from therapist 2 x 10; R shoulder internal rotation from forearm vertical to stomach with manual resistance from therapist x 10; R shoulder external rotation from stomach to forearm vertical with manual resistance from therapist 2 reps performed and then discontinued secondary to pain;   Manual Therapy  L elbow PROM flexion/extension x multiple bouts; L elbow PROM pronation/supination x multiple bouts;    PATIENT EDUCATION:  Education details:  Pt educated throughout session about proper posture and technique with exercises. Improved exercise technique, movement at target joints, use of target muscles after min to mod verbal, visual, tactile cues.  Person educated: Patient Education method: Explanation Education comprehension: verbalized understanding and returned demonstration   HOME EXERCISE PROGRAM: Access Code: [email or web address] URL: [email or web address] Date: 01/10/2022 Prepared by: Roxana Hires  Exercises - Wrist Flexion AROM  - 2 x daily - 7 x weekly - 2 sets - 10 reps - 3s hold - Wrist Extension AROM  - 2 x daily - 7 x weekly - 2 sets - 10 reps - 3s hold - Seated Scapular  Retraction  - 2 x daily - 7 x weekly - 2 sets - 10 reps - 3s hold - Supported Elbow Flexion Extension PROM  -  2 x daily - 7 x weekly - 2 sets - 10 reps - 3s hold - Forearm Supination PROM (Mirrored)  - 2 x daily - 7 x weekly - 2 sets - 10 reps - 3s hold - Forearm Pronation PROM (Mirrored)  - 2 x daily - 7 x weekly - 2 sets - 10 reps - 3s hold - Seated Gripping Towel  - 2 x daily - 7 x weekly - 2 sets - 10 reps - 3s hold   ASSESSMENT:  CLINICAL IMPRESSION: Pt reporting higher level of bilateral shoulder pain upon arrival today.  PT continued with PROM for L elbow and AROM for L wrist/hand. Continued with R shoulder strengthening but decreased to 2# dumbbells today due to recent increase in his pain. Session shortened today due to higher level of pain after last session and upon arrival today. He has a follow-up appointment scheduled with cardiology to discuss his afib. Pt encouraged to continue HEP and follow-up as scheduled. Plan to progress LUE per protocol as pt is able to tolerate. He will benefit from skilled PT to address deficits in range of motion and strength in order to improve overall function at home and with leisure activities such as caring for his property.   REHAB POTENTIAL: Good  CLINICAL DECISION MAKING: Evolving/moderate complexity  EVALUATION COMPLEXITY: Moderate   GOALS: Goals reviewed with patient? Yes  SHORT TERM GOALS: Target date: 02/14/22  Pt will be independent with HEP to improve strength and decrease right shoulder pain to improve pain-free function at home and work. Baseline:  Goal status: INITIAL   LONG TERM GOALS: Target date: 03/28/22  Pt will increase L shoulder FOTO to at least 51 to demonstrate significant improvement in function at home and with leisure activities related to L shoulder. Baseline: 01/03/22: To be completed; 01/24/22: 4 Goal status: INITIAL  2.  Pt will decrease worst L shoulder pain by at least 3 points on the NPRS in order to  demonstrate clinically significant reduction in R shoulder pain. Baseline: 01/03/22: worst: 10/10 Goal status: INITIAL  3.  Pt will decrease quick DASH score by at least 8% in order to demonstrate clinically significant reduction in disability related to L shoulder pain        Baseline: 01/03/22: 47.7% Goal status: INITIAL  4. Pt will demonstrate L shoulder flexion, abduction, IR, and ER strength of at least 4/5 in order to demonstrate improvement in strength and function       Baseline: 01/03/22: Unable to test per protocol Goal status: INITIAL   PLAN: PT FREQUENCY: 1-2x/week  PT DURATION: 12 weeks  PLANNED INTERVENTIONS: Therapeutic exercises, Therapeutic activity, Neuromuscular re-education, Balance training, Gait training, Patient/Family education, Joint manipulation, Joint mobilization, Vestibular training, Canalith repositioning, Aquatic Therapy, Dry Needling, Electrical stimulation, Spinal manipulation, Spinal mobilization, Cryotherapy, Moist heat, Traction, Ultrasound, Ionotophoresis 83m/ml Dexamethasone, and Manual therapy  PLAN FOR NEXT SESSION: Progress L shoulder ROM and strength per protocol, continue R shoulder strengthening  JLyndel SafeHuprich PT, DPT, GCS  02/03/2022, 1:31 PM

## 2022-02-03 ENCOUNTER — Encounter: Payer: Self-pay | Admitting: Family Medicine

## 2022-02-03 ENCOUNTER — Ambulatory Visit (INDEPENDENT_AMBULATORY_CARE_PROVIDER_SITE_OTHER): Payer: Medicare Other | Admitting: Family Medicine

## 2022-02-03 VITALS — BP 126/74 | HR 59 | Temp 96.8°F | Wt 339.0 lb

## 2022-02-03 DIAGNOSIS — R1032 Left lower quadrant pain: Secondary | ICD-10-CM

## 2022-02-03 DIAGNOSIS — K4090 Unilateral inguinal hernia, without obstruction or gangrene, not specified as recurrent: Secondary | ICD-10-CM | POA: Diagnosis not present

## 2022-02-03 DIAGNOSIS — Z6841 Body Mass Index (BMI) 40.0 and over, adult: Secondary | ICD-10-CM

## 2022-02-03 DIAGNOSIS — Z1211 Encounter for screening for malignant neoplasm of colon: Secondary | ICD-10-CM

## 2022-02-03 NOTE — Patient Instructions (Addendum)
Thank you for coming to the office today.  Concern for possible hernia vs muscle tear. We will consult Surgeons first to evaluate the hernia and consider imaging.  Tried to ask if they can go to [facility] first.  Referral to General Surgery to [facility] Surgery Department [place], [place]  [phone]  Labs non fasting, for Rheumatological Systemic Arthritis / inflammatory stay tuned for results.   Please schedule a Follow-up Appointment to: Return for 1-2 weeks for NON fasting Lab only. Keep apt in July for physical.  If you have any other questions or concerns, please feel free to call the office or send a message through [place]. You may also schedule an earlier appointment if necessary.  Additionally, you may be receiving a survey about your experience at our office within a few days to 1 week by e-mail or mail. We value your feedback.  Nobie Putnam, DO [place]

## 2022-02-03 NOTE — Progress Notes (Signed)
Subjective:    Patient ID: [name], male    DOB: 1955-08-23, 67 y.o.   MRN: [id number]  [name] is a 67 y.o. male presenting on 02/03/2022 for Groin Pain (/)   HPI   Left sided Inguinal vs Groin Pain Morbid Obesity New problem worse in recent history, few weeks now. Coughing, has sharp tearing pain Episodic pain can be 1 week on and then off He is s/p gastric bypass surgery. He has excess fatty tissue / excess skin in this region. Some increased weight gain over holidays. He has excess tissue in this area.  Has seen Dr Lynnell Catalan [facility] for MSK tears / Shoulders w/ rotator cuffs   Joint Pain, multiple Arthritis of multiple joints He asks about inflammatory testing as recommended by orthopedics  Health Maintenance: Last Colonoscopy done [facility] Dr Marius Ditch 09/2017, repeat 5 years, due 09/2022 , he requests in [place] at [facility]     02/03/2022    3:21 PM 12/07/2021   10:00 AM 11/28/2021    1:24 PM  Depression screen PHQ 2/9  Decreased Interest 2 0 2  Down, Depressed, Hopeless 1 0 0  PHQ - 2 Score 3 0 2  Altered sleeping 3  3  Tired, decreased energy 3  2  Change in appetite 3  0  Feeling bad or failure about yourself  1  0  Trouble concentrating 1  2  Moving slowly or fidgety/restless 0  0  Suicidal thoughts 0  0  PHQ-9 Score 14  9  Difficult doing work/chores Not difficult at all  Somewhat difficult    Social History   Tobacco Use   Smoking status: Former    Packs/day: 1.00    Years: 10.00    Total pack years: 10.00    Types: Cigarettes    Quit date: 02/18/1979    Years since quitting: 42.9   Smokeless tobacco: Former    Types: Snuff  Vaping Use   Vaping Use: Never used  Substance Use Topics   Alcohol use: Not Currently    Comment: last drink in 2021   Drug use: No    Review of Systems Per HPI unless specifically indicated above     Objective:    BP 126/74 (BP Location: Right Arm, Patient Position: Sitting, Cuff Size: Large)   Pulse (!) 59    Temp (!) 96.8 F (36 C) (Temporal)   Wt (!) 339 lb (153.8 kg)   SpO2 98%   BMI 45.98 kg/m   Wt Readings from Last 3 Encounters:  02/03/22 (!) 339 lb (153.8 kg)  01/02/22 (!) 320 lb (145.2 kg)  12/27/21 (!) 320 lb (145.2 kg)    Physical Exam Vitals and nursing note reviewed.  Constitutional:      General: He is not in acute distress.    Appearance: Normal appearance. He is well-developed. He is obese. He is not diaphoretic.     Comments: Well-appearing, comfortable, cooperative  HENT:     Head: Normocephalic and atraumatic.  Eyes:     General:        Right eye: No discharge.        Left eye: No discharge.     Conjunctiva/sclera: Conjunctivae normal.  Cardiovascular:     Rate and Rhythm: Normal rate.  Pulmonary:     Effort: Pulmonary effort is normal.  Abdominal:     General: Bowel sounds are normal.     Hernia: No hernia (No obvious hernia provoked on exam.  He has very large excess tissue abdominal wall pannus extending over and into inguinal region, provoked tenderness at inguinal canal / femoral region of groin.) is present.  Skin:    General: Skin is warm and dry.     Findings: No erythema or rash.  Neurological:     Mental Status: He is alert and oriented to person, place, and time.  Psychiatric:        Mood and Affect: Mood normal.        Behavior: Behavior normal.        Thought Content: Thought content normal.     Comments: Well groomed, good eye contact, normal speech and thoughts    Results for orders placed or performed during the hospital encounter of 11/03/21  Type and screen Order type and screen if day of surgery is less than 15 days from draw of preadmission visit or order morning of surgery if day of surgery is greater than 6 days from preadmission visit.  Result Value Ref Range   ABO/RH(D) O NEG    Antibody Screen NEG    Sample Expiration 11/17/2021,2359    Extend sample reason      NO TRANSFUSIONS OR PREGNANCY IN THE PAST 3 MONTHS Performed at  [facility], [address], [place], [place] [address]       Assessment & Plan:   Problem List Items Addressed This Visit     Morbid obesity with BMI of 50.0-59.9, adult [facility])   Other Visit Diagnoses     Left inguinal pain    -  Primary   Relevant Orders   Ambulatory referral to General Surgery   Screening for colon cancer       Relevant Orders   Ambulatory referral to Gastroenterology   Left inguinal hernia       Relevant Orders   Ambulatory referral to General Surgery       Morbid Obesity BMI >45 w large abdominal wall pannus in this area in question with the episodic pain as described. Seems to be mostly provoked only with lifting straining coughing etc. Suggestive of potential hernia.  Concern for possible hernia vs muscle injury  Difficulty identifying hernia on exam however  We will consult General Surgeons first to evaluate the hernia and consider imaging if warranted.  [facility] Surgery Department [place], [place]  [id number] [phone]  Reviewed hernia precautions if worsening. May try support/binder/truss in meantime.  Labs non fasting, for Rheumatological Systemic Arthritis / inflammatory stay tuned for results.   Orders Placed This Encounter  Procedures   Ambulatory referral to Gastroenterology    Referral Priority:   Routine    Referral Type:   Consultation    Referral Reason:   Specialty Services Required    Number of Visits Requested:   1   Ambulatory referral to General Surgery    Referral Priority:   Routine    Referral Type:   Surgical    Referral Reason:   Specialty Services Required    Requested Specialty:   General Surgery    Number of Visits Requested:   1     No orders of the defined types were placed in this encounter.     Follow up plan: Return for 1-2 weeks for NON fasting Lab only. Keep apt in July for physical.  Future labs rheumatological  Nobie Putnam, [facility] 02/03/2022, 3:48 PM

## 2022-02-04 ENCOUNTER — Other Ambulatory Visit: Payer: Self-pay | Admitting: Family Medicine

## 2022-02-04 ENCOUNTER — Encounter: Payer: Self-pay | Admitting: Family Medicine

## 2022-02-04 DIAGNOSIS — G8929 Other chronic pain: Secondary | ICD-10-CM

## 2022-02-04 DIAGNOSIS — M5136 Other intervertebral disc degeneration, lumbar region: Secondary | ICD-10-CM

## 2022-02-06 ENCOUNTER — Other Ambulatory Visit: Payer: Self-pay

## 2022-02-06 ENCOUNTER — Telehealth: Payer: Self-pay | Admitting: Cardiovascular Disease

## 2022-02-06 ENCOUNTER — Telehealth: Payer: Self-pay

## 2022-02-06 DIAGNOSIS — Z86010 Personal history of colonic polyps: Secondary | ICD-10-CM

## 2022-02-06 MED ORDER — NA SULFATE-K SULFATE-MG SULF 17.5-3.13-1.6 GM/177ML PO SOLN: 1.0000 | Freq: Once | ORAL | 0 refills | Status: AC

## 2022-02-06 NOTE — Telephone Encounter (Signed)
I have confirmed the procedure date is 10/09/22 and they realize it's too soon to ask for surgical clearance and they will resend it in the middle of July.
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
## 2022-02-06 NOTE — Telephone Encounter (Signed)
   Pre-operative Risk Assessment    Patient Name: [name]  DOB: 1955-09-09 MRN: [id number]      Request for Surgical Clearance    Procedure:  [facility]  Date of Surgery:  Clearance 10/09/22                                 Surgeon: Not listed Surgeon's Group or Practice Name:  [facility] Phone number:  [phone] Fax number:  [phone]   Type of Clearance Requested:   - Medical , Pharmacy   Type of Anesthesia:  Not Indicated   Additional requests/questions:    Signed, Maxwell Caul   02/06/2022, 3:47 PM

## 2022-02-06 NOTE — Telephone Encounter (Signed)
Gastroenterology Pre-Procedure Review  Request Date: 10/06/22 Requesting Physician: Dr. Marius Ditch  PATIENT REVIEW QUESTIONS: The patient responded to the following health history questions as indicated:    1. Are you having any GI issues?  indigestion 2. Do you have a personal history of Polyps? yes (last colonoscopy 10/08/2017) 3. Do you have a family history of Colon Cancer or Polyps? no 4. Diabetes Mellitus? no 5. Joint replacements in the past 12 months?yes (shoulder replacement 09/27/21 and rotator cuff surgery 12/27/21) 6. Major health problems in the past 3 months?shoulder surgery 12/27/21 7. Any artificial heart valves, MVP, or defibrillator?yes (Paroxysmal Atrial Fib cardiologist Dr. Rockey Situ Cardiac Clearance to be sent with Blood Thinner Advice Request)    MEDICATIONS & ALLERGIES:    Patient reports the following regarding taking any anticoagulation/antiplatelet therapy:   Plavix, Coumadin, Eliquis, Xarelto, Lovenox, Pradaxa, Brilinta, or Effient? yes (Xarelto '20mg'$  and Aspirin 325 mg) Aspirin? yes (325 mg daily)  Patient confirms/reports the following medications:  Current Outpatient Medications  Medication Sig Dispense Refill   acetaminophen (TYLENOL) 500 MG tablet Take 2 tablets (1,000 mg total) by mouth every 8 (eight) hours. 90 tablet 2   amiodarone (PACERONE) 200 MG tablet TAKE ONE TABLET BY MOUTH ONE TIME DAILY 90 tablet 2   amLODipine (NORVASC) 5 MG tablet Take 1 tablet (5 mg total) by mouth daily. (Patient taking differently: Take 5 mg by mouth every evening.) 90 tablet 3   aspirin EC 325 MG tablet Take 325 mg by mouth daily.     benazepril (LOTENSIN) 40 MG tablet TAKE ONE TABLET BY MOUTH ONE TIME DAILY (Patient taking differently: Take 40 mg by mouth every morning. TAKE ONE TABLET BY MOUTH ONE TIME DAILY) 90 tablet 3   busPIRone (BUSPAR) 10 MG tablet TAKE ONE TABLET BY MOUTH FOUR TIMES A DAY AS NEEDED FOR ANXIETY (Patient taking differently: Take 10 mg by mouth QID.) 360  tablet 1   carboxymethylcellulose (REFRESH PLUS) 0.5 % SOLN 1 drop 3 (three) times daily as needed.     cyanocobalamin (VITAMIN B12) 1000 MCG/ML injection Inject 1 mL (1,000 mcg total) into the skin every 30 (thirty) days. INJECT 1ML EVERY 30 DAYS AS DIRECTED 1 mL 99   cyclobenzaprine (FLEXERIL) 10 MG tablet Take 10 mg by mouth as needed.     DULoxetine (CYMBALTA) 30 MG capsule TAKE ONE CAPSULE BY MOUTH ONE TIME DAILY 90 capsule 1   HYDROcodone-acetaminophen (NORCO) 7.5-325 MG tablet Take 1 tablet by mouth every 6 (six) hours as needed for severe pain. Must last 30 days 120 tablet 0   melatonin 3 MG TABS tablet Take 3 mg by mouth at bedtime.     metoprolol succinate (TOPROL-XL) 50 MG 24 hr tablet TAKE ONE TABLET BY MOUTH ONE TIME DAILY WITH OR IMMEDIATELY FOLLOWING A MEAL 90 tablet 2   Multiple Vitamin (MULTIVITAMIN WITH MINERALS) TABS tablet Take 1 tablet by mouth daily.     ondansetron (ZOFRAN-ODT) 4 MG disintegrating tablet Take 1 tablet (4 mg total) by mouth every 8 (eight) hours as needed for nausea or vomiting. 20 tablet 0   oxyCODONE (ROXICODONE) 5 MG immediate release tablet Take 1-2 tablets (5-10 mg total) by mouth every 4 (four) hours as needed (pain). 30 tablet 0   rivaroxaban (XARELTO) 20 MG TABS tablet TAKE ONE TABLET BY MOUTH ONE TIME DAILY WITH SUPPER 90 tablet 1   traZODone (DESYREL) 150 MG tablet TAKE ONE TABLET BY MOUTH AT BEDTIME 90 tablet 1   No current facility-administered medications for  this visit.    Patient confirms/reports the following allergies:  No Known Allergies  No orders of the defined types were placed in this encounter.   AUTHORIZATION INFORMATION Primary Insurance: 1D#: Group #:  Secondary Insurance: 1D#: Group #:  SCHEDULE INFORMATION: Date: 10/09/22 Time: Location: [facility]

## 2022-02-07 ENCOUNTER — Ambulatory Visit
Payer: Medicare Other | Attending: Student in an Organized Health Care Education/Training Program | Admitting: Student in an Organized Health Care Education/Training Program

## 2022-02-07 ENCOUNTER — Encounter: Payer: Self-pay | Admitting: Cardiovascular Disease

## 2022-02-07 ENCOUNTER — Encounter: Payer: Self-pay | Admitting: Student in an Organized Health Care Education/Training Program

## 2022-02-07 ENCOUNTER — Ambulatory Visit: Payer: Medicare Other | Attending: Cardiovascular Disease | Admitting: Cardiovascular Disease

## 2022-02-07 ENCOUNTER — Other Ambulatory Visit: Payer: Self-pay | Admitting: Surgery

## 2022-02-07 VITALS — BP 100/60 | HR 69 | Ht 72.0 in | Wt 335.1 lb

## 2022-02-07 VITALS — BP 143/81 | HR 65 | Temp 97.0°F | Ht 72.0 in | Wt 333.0 lb

## 2022-02-07 DIAGNOSIS — M25511 Pain in right shoulder: Secondary | ICD-10-CM | POA: Diagnosis not present

## 2022-02-07 DIAGNOSIS — M25562 Pain in left knee: Secondary | ICD-10-CM | POA: Diagnosis not present

## 2022-02-07 DIAGNOSIS — E782 Mixed hyperlipidemia: Secondary | ICD-10-CM

## 2022-02-07 DIAGNOSIS — M25512 Pain in left shoulder: Secondary | ICD-10-CM | POA: Insufficient documentation

## 2022-02-07 DIAGNOSIS — M5416 Radiculopathy, lumbar region: Secondary | ICD-10-CM | POA: Insufficient documentation

## 2022-02-07 DIAGNOSIS — M791 Myalgia, unspecified site: Secondary | ICD-10-CM

## 2022-02-07 DIAGNOSIS — I2584 Coronary atherosclerosis due to calcified coronary lesion: Secondary | ICD-10-CM

## 2022-02-07 DIAGNOSIS — I2510 Atherosclerotic heart disease of native coronary artery without angina pectoris: Secondary | ICD-10-CM | POA: Diagnosis not present

## 2022-02-07 DIAGNOSIS — T466X5A Adverse effect of antihyperlipidemic and antiarteriosclerotic drugs, initial encounter: Secondary | ICD-10-CM

## 2022-02-07 DIAGNOSIS — I4819 Other persistent atrial fibrillation: Secondary | ICD-10-CM

## 2022-02-07 DIAGNOSIS — G8929 Other chronic pain: Secondary | ICD-10-CM | POA: Diagnosis not present

## 2022-02-07 DIAGNOSIS — M19011 Primary osteoarthritis, right shoulder: Secondary | ICD-10-CM | POA: Insufficient documentation

## 2022-02-07 DIAGNOSIS — T466X5D Adverse effect of antihyperlipidemic and antiarteriosclerotic drugs, subsequent encounter: Secondary | ICD-10-CM | POA: Diagnosis not present

## 2022-02-07 DIAGNOSIS — M47816 Spondylosis without myelopathy or radiculopathy, lumbar region: Secondary | ICD-10-CM | POA: Diagnosis not present

## 2022-02-07 DIAGNOSIS — G894 Chronic pain syndrome: Secondary | ICD-10-CM | POA: Insufficient documentation

## 2022-02-07 DIAGNOSIS — I10 Essential (primary) hypertension: Secondary | ICD-10-CM

## 2022-02-07 DIAGNOSIS — R1032 Left lower quadrant pain: Secondary | ICD-10-CM | POA: Diagnosis not present

## 2022-02-07 DIAGNOSIS — M19012 Primary osteoarthritis, left shoulder: Secondary | ICD-10-CM | POA: Diagnosis not present

## 2022-02-07 DIAGNOSIS — I519 Heart disease, unspecified: Secondary | ICD-10-CM | POA: Diagnosis not present

## 2022-02-07 DIAGNOSIS — M25561 Pain in right knee: Secondary | ICD-10-CM | POA: Diagnosis not present

## 2022-02-07 MED ORDER — HYDROCODONE-ACETAMINOPHEN 7.5-325 MG PO TABS: 1.0000 | ORAL_TABLET | Freq: Four times a day (QID) | ORAL | 0 refills | Status: DC | PRN

## 2022-02-07 MED ORDER — AMLODIPINE BESYLATE 5 MG PO TABS: 5.0000 mg | ORAL_TABLET | Freq: Every day | ORAL | 1 refills | Status: DC | PRN

## 2022-02-07 MED ORDER — AMIODARONE HCL 200 MG PO TABS: 200.0000 mg | ORAL_TABLET | Freq: Two times a day (BID) | ORAL | 1 refills | Status: DC

## 2022-02-07 MED ORDER — FUROSEMIDE 20 MG PO TABS: 20.0000 mg | ORAL_TABLET | Freq: Every day | ORAL | 1 refills | Status: DC | PRN

## 2022-02-07 NOTE — Patient Instructions (Addendum)
Medication Instructions:  Hold amlodipine as BP is low  Increase amiodarone up to 200 mg twice a day EKG with PMD  Lasix 20 mg daily as needed for leg swelling  If you need a refill on your cardiac medications before your next appointment, please call your pharmacy.   Lab work: No new labs needed  Testing/Procedures: No new testing needed  Follow-Up: At [facility], you and your health needs are our priority.  As part of our continuing mission to provide you with exceptional heart care, we have created designated Provider Care Teams.  These Care Teams include your primary Cardiologist (physician) and Advanced Practice Providers (APPs -  Physician Assistants and Nurse Practitioners) who all work together to provide you with the care you need, when you need it.  You will need a follow up appointment in 8 weeks  Providers on your designated Care Team:   Murray Hodgkins, NP Christell Faith, PA-C Cadence Kathlen Mody, [place]  COVID-19 Vaccine Information can be found at: [email or web address] For questions related to vaccine distribution or appointments, please email [email or web address] or call [phone].

## 2022-02-07 NOTE — Progress Notes (Signed)
PROVIDER NOTE: Information contained herein reflects review and annotations entered in association with encounter. Interpretation of such information and data should be left to medically-trained personnel. Information provided to patient can be located elsewhere in the medical record under "Patient Instructions". Document created using STT-dictation technology, any transcriptional errors that may result from process are unintentional.    Patient: [name]  Service Category: E/M  Provider: Gillis Santa, MD  DOB: [birth year]  DOS: 02/07/2022  Specialty: Interventional Pain Management  MRN: [id number]  Setting: Ambulatory outpatient  PCP: Olin Hauser, DO  Type: Established Patient    Referring Provider: Nobie Putnam *  Location: Office  Delivery: Face-to-face     HPI  [name], a 67 y.o. year old male, is here today because of his Lumbar facet joint syndrome [M47.816]. [name] primary complain today is Back Pain (lower), Leg Pain (Tendons at knees), and Wrist Pain (bilat) Last encounter: My last encounter with him was on 01/30/22 Pertinent problems: [name] has H/O gastric bypass; DJD (degenerative joint disease) of knee; Bilateral carpal tunnel syndrome; Paroxysmal atrial fibrillation ([place]); Chronic radicular lumbar pain; Lumbar radiculopathy; Lumbar degenerative disc disease; Lumbar facet arthropathy; Lumbar facet joint syndrome; Primary osteoarthritis of both wrists; Trigger middle finger of left hand; and Chronic pain syndrome on their pertinent problem list. Pain Assessment: Severity of Chronic pain is reported as a 4 /10. Location: Back Lower/down both begs, not as much on LEFT since last procedure. Onset: More than a month ago. Quality: Aching, Discomfort, Constant, Sharp (pain is exacerbated with increased activitiy). Timing: Constant. Modifying factor(s): meds. Vitals:  height is 6' (1.829 m) and weight is 333 lb (151 kg) (abnormal). His temporal temperature  is 97 F (36.1 C) (abnormal). His blood pressure is 143/81 (abnormal) and his pulse is 65. His oxygen saturation is 100%.   Reason for encounter: medication management   Patient states that he just came from seeing his cardiologist and has been told that he is currently in atrial fibrillation.  He is experiencing shortness of breath.  He continues to endorse bilateral shoulder pain, he is status post left rotator cuff surgery and is healing from that.  He continues to have low back pain but that is better after his lumbar L3, L4, L5 RFA that was done 12/07/2021 for the left side and 01/02/2022 for the right side  He states that he will contact me to schedule a wrist injection in the next 4 to 6 weeks.  I encouraged him to continue with physical area therapy exercises    Pharmacotherapy Assessment  Analgesic:  hydrocodone 7.5 mg 3 times daily as needed     Monitoring: [place] PMP: PDMP reviewed during this encounter.       Pharmacotherapy: No side-effects or adverse reactions reported. Compliance: No problems identified. Effectiveness: Clinically acceptable.  No notes on file   UDS:  Summary  Date Value Ref Range Status  08/23/2021 Note  Final    Comment:    ==================================================================== [facility] (MW) ==================================================================== Test                             Result       Flag       Units  Drug Present and Declared for Prescription Verification   Hydrocodone                    [phone]  EXPECTED   ng/mg creat   Hydromorphone                  98           EXPECTED   ng/mg creat   Dihydrocodeine                 478          EXPECTED   ng/mg creat   Norhydrocodone                 >2488        EXPECTED   ng/mg creat    Sources of hydrocodone include scheduled prescription medications.    Hydromorphone, dihydrocodeine and norhydrocodone are expected    metabolites of hydrocodone.  Hydromorphone and dihydrocodeine are    also available as scheduled prescription medications.  ==================================================================== Test                      Result    Flag   Units      Ref Range   Creatinine              201              mg/dL      >=20 ==================================================================== Declared Medications:  The flagging and interpretation on this report are based on the  following declared medications.  Unexpected results may arise from  inaccuracies in the declared medications.   **Note: The testing scope of this panel includes these medications:   Hydrocodone (Norco)   **Note: The testing scope of this panel does not include the  following reported medications:   Acetaminophen (Norco)  Amiodarone (Pacerone)  Amlodipine (Norvasc)  Aspirin  Benazepril (Lotensin)  Buspirone (Buspar)  Cyclobenzaprine (Flexeril)  Duloxetine (Cymbalta)  Eye Drops  Furosemide (Lasix)  Melatonin  Metoprolol (Toprol)  Multivitamin  Rivaroxaban (Xarelto)  Supplement  Tamsulosin (Flomax)  Trazodone (Desyrel)  Vitamin B12 ==================================================================== For clinical consultation, please call [phone]. ====================================================================      ROS  Constitutional: Denies any fever or chills Gastrointestinal: No reported hemesis, hematochezia, vomiting, or acute GI distress Musculoskeletal:  Bilateral shoulder pain, right greater than left wrist pain Neurological: No reported episodes of acute onset apraxia, aphasia, dysarthria, agnosia, amnesia, paralysis, loss of coordination, or loss of consciousness  Medication Review  DULoxetine, HYDROcodone-acetaminophen, amLODipine, amiodarone, aspirin EC, benazepril, busPIRone, carboxymethylcellulose, cyanocobalamin, cyclobenzaprine, furosemide, melatonin, metoprolol succinate, multivitamin with minerals,  rivaroxaban, and traZODone  History Review  Allergy: [name] has No Known Allergies. Drug: [name]  reports no history of drug use. Alcohol:  reports that he does not currently use alcohol. Tobacco:  reports that he quit smoking about 43 years ago. His smoking use included cigarettes. He has a 10.00 pack-year smoking history. He has quit using smokeless tobacco.  His smokeless tobacco use included snuff. Social: Mr. Schiller  reports that he quit smoking about 43 years ago. His smoking use included cigarettes. He has a 10.00 pack-year smoking history. He has quit using smokeless tobacco.  His smokeless tobacco use included snuff. He reports that he does not currently use alcohol. He reports that he does not use drugs. Medical:  has a past medical history of Anxiety, Aortic atherosclerosis ([place]), Cardiomyopathy (in setting of Afib), Chronic pain syndrome, Chronic, continuous use of opioids, Coronary artery disease (11/06/2014), Diverticulosis, DJD (degenerative joint disease) of knee, History of hiatal hernia, History of kidney stones (2012), Hyperlipidemia, Hyperplastic colon polyp, Hypertension, Internal hemorrhoids,  Intervertebral disc disorder with radiculopathy of lumbosacral region, Long term current use FULL DOSE (325 mg) aspirin, Long term current use of amiodarone, Long term current use of antithrombotics/antiplatelets, Lower extremity edema, Mixed hyperlipidemia, Myalgia due to statin, Osteoarthritis, PAF (paroxysmal atrial fibrillation) ([place]), Pernicious anemia, Prostate cancer ([place]), Pulmonary nodule, right, Sleep difficulties, SVT (supraventricular tachycardia), and Tubular adenoma of colon. Surgical: Mr. Peloquin  has a past surgical history that includes Knee surgery (Right); Gastric bypass (01/17/2000); prostate seeding; Carpal tunnel release (Right, 12/23/2014); Carpal tunnel release (Left, 01/06/2015); Colonoscopy with propofol (N/A, 10/08/2017); Cardioversion (N/A, 05/15/2019);  Cardioversion (N/A, 03/13/2014); Reverse shoulder arthroplasty (Right, 09/27/2021); Bicept tenodesis (Right, 09/27/2021); and Shoulder arthroscopy with rotator cuff repair and open biceps tenodesis (Left, 12/27/2021). Family: family history includes Arrhythmia in his brother and sister; Cancer in his father; Heart disease in his father; Hypertension in his father; Stroke in his father.  Laboratory Chemistry Profile   Renal Lab Results  Component Value Date   BUN 16 09/28/2021   CREATININE 0.96 95/63/8756   BCR NOT APPLICABLE 43/32/9518   GFRAA 72 07/21/2020   GFRNONAA >60 09/28/2021    Hepatic Lab Results  Component Value Date   AST 12 07/27/2021   ALT 10 07/27/2021   ALBUMIN 4.6 08/01/2019   ALKPHOS 72 08/01/2019   LIPASE 28 03/13/2017    Electrolytes Lab Results  Component Value Date   NA 141 09/28/2021   K 4.3 09/28/2021   CL 111 09/28/2021   CALCIUM 8.1 (L) 09/28/2021   MG 2.0 02/01/2021    Bone Lab Results  Component Value Date   25OHVITD1 32 02/01/2021   25OHVITD2 <1.0 02/01/2021   25OHVITD3 32 02/01/2021    Inflammation (CRP: Acute Phase) (ESR: Chronic Phase) No results found for: "CRP", "ESRSEDRATE", "LATICACIDVEN"       Note: Above Lab results reviewed.    Physical Exam  General appearance: Well nourished, well developed, and well hydrated. In no apparent acute distress Mental status: Alert, oriented x 3 (person, place, & time)       Respiratory: No evidence of acute respiratory distress Eyes: PERLA Vitals: BP (!) 143/81   Pulse 65   Temp (!) 97 F (36.1 C) (Temporal)   Ht 6' (1.829 m)   Wt (!) 333 lb (151 kg)   SpO2 100%   BMI 45.16 kg/m  BMI: Estimated body mass index is 45.16 kg/m as calculated from the following:   Height as of this encounter: 6' (1.829 m).   Weight as of this encounter: 333 lb (151 kg). Ideal: Ideal body weight: 77.6 kg (171 lb 1.2 oz) Adjusted ideal body weight: 107 kg (235 lb 13.5 oz)  Right wrist pain worse with wrist  extension  Lumbar Spine Area Exam  Skin & Axial Inspection: No masses, redness, or swelling Alignment: Symmetrical Functional ROM: Pain restricted ROM       Stability: No instability detected Muscle Tone/Strength: Functionally intact. No obvious neuro-muscular anomalies detected. Sensory (Neurological): Musculoskeletal pain pattern  Gait & Posture Assessment  Ambulation: Unassisted Gait: Relatively normal for age and body habitus Posture: WNL  Lower Extremity Exam      Side: Right lower extremity   Side: Left lower extremity  Stability: No instability observed           Stability: No instability observed          Skin & Extremity Inspection: Skin color, temperature, and hair growth are WNL. No peripheral edema or cyanosis. No masses, redness, swelling, asymmetry, or associated skin  lesions. No contractures.   Skin & Extremity Inspection: Skin color, temperature, and hair growth are WNL. No peripheral edema or cyanosis. No masses, redness, swelling, asymmetry, or associated skin lesions. No contractures.  Functional ROM: Pain restricted ROM for hip and knee joints           Functional ROM: Pain restricted ROM for hip and knee joints          Muscle Tone/Strength: Functionally intact. No obvious neuro-muscular anomalies detected.   Muscle Tone/Strength: Functionally intact. No obvious neuro-muscular anomalies detected.  Sensory (Neurological): Unimpaired         Sensory (Neurological): Unimpaired        DTR: Patellar: deferred today Achilles: deferred today Plantar: deferred today   DTR: Patellar: deferred today Achilles: deferred today Plantar: deferred today  Palpation: No palpable anomalies   Palpation: No palpable anomalies     Assessment   Status Diagnosis  Controlled Controlled Controlled 1. Lumbar facet joint syndrome   2. Lumbar facet arthropathy   3. Chronic pain of both knees   4. Localized osteoarthritis of shoulder regions, bilateral   5. Chronic radicular lumbar  pain   6. Lumbar radiculopathy   7. Chronic pain of both shoulders   8. Chronic pain syndrome       Plan of Care   Mr. KAESYN JOHNSTON has a current medication list which includes the following long-term medication(s): amiodarone, amlodipine, benazepril, duloxetine, furosemide, metoprolol succinate, xarelto, and trazodone.  Pharmacotherapy (Medications Ordered): Meds ordered this encounter  Medications   DISCONTD: HYDROcodone-acetaminophen (NORCO) 7.5-325 MG tablet    Sig: Take 1 tablet by mouth every 6 (six) hours as needed for severe pain. Must last 30 days    Dispense:  120 tablet    Refill:  0    Chronic Pain: STOP Act (Not applicable) Fill 1 day early if closed on refill date. Avoid benzodiazepines within 8 hours of opioids   DISCONTD: HYDROcodone-acetaminophen (NORCO) 7.5-325 MG tablet    Sig: Take 1 tablet by mouth every 6 (six) hours as needed for severe pain. Must last 30 days    Dispense:  120 tablet    Refill:  0    Chronic Pain: STOP Act (Not applicable) Fill 1 day early if closed on refill date. Avoid benzodiazepines within 8 hours of opioids   DISCONTD: HYDROcodone-acetaminophen (NORCO) 7.5-325 MG tablet    Sig: Take 1 tablet by mouth every 6 (six) hours as needed for severe pain. Must last 30 days    Dispense:  120 tablet    Refill:  0    Chronic Pain: STOP Act (Not applicable) Fill 1 day early if closed on refill date. Avoid benzodiazepines within 8 hours of opioids   HYDROcodone-acetaminophen (NORCO) 7.5-325 MG tablet    Sig: Take 1 tablet by mouth every 6 (six) hours as needed for severe pain. Must last 30 days    Dispense:  120 tablet    Refill:  0    Chronic Pain: STOP Act (Not applicable) Fill 1 day early if closed on refill date. Avoid benzodiazepines within 8 hours of opioids   HYDROcodone-acetaminophen (NORCO) 7.5-325 MG tablet    Sig: Take 1 tablet by mouth every 6 (six) hours as needed for severe pain. Must last 30 days    Dispense:  120 tablet    Refill:   0    Chronic Pain: STOP Act (Not applicable) Fill 1 day early if closed on refill date. Avoid benzodiazepines within 8 hours of  opioids   HYDROcodone-acetaminophen (NORCO) 7.5-325 MG tablet    Sig: Take 1 tablet by mouth every 6 (six) hours as needed for severe pain. Must last 30 days    Dispense:  120 tablet    Refill:  0    Chronic Pain: STOP Act (Not applicable) Fill 1 day early if closed on refill date. Avoid benzodiazepines within 8 hours of opioids   Patient instructed to call when he would like to have wrist injection  Follow-up plan:   Return in about 3 months (around 05/09/2022) for Medication Management, in person.     Recent Visits Date Type Provider Dept  01/30/22 Office Visit Gillis Santa, MD [facility]  01/02/22 Procedure visit Gillis Santa, MD [facility]  12/07/21 Procedure visit Gillis Santa, MD [facility]  11/15/21 Office Visit Gillis Santa, MD [facility]  Showing recent visits within past 90 days and meeting all other requirements Today's Visits Date Type Provider Dept  02/07/22 Office Visit Gillis Santa, MD [facility]  Showing today's visits and meeting all other requirements Future Appointments No visits were found meeting these conditions. Showing future appointments within next 90 days and meeting all other requirements  I discussed the assessment and treatment plan with the patient. The patient was provided an opportunity to ask questions and all were answered. The patient agreed with the plan and demonstrated an understanding of the instructions.  Patient advised to call back or seek an in-person evaluation if the symptoms or condition worsens.  Duration of encounter: 30 minutes.  Note by: Gillis Santa, MD Date: 02/07/2022; Time: 2:09 PM

## 2022-02-08 ENCOUNTER — Other Ambulatory Visit: Payer: Self-pay

## 2022-02-08 DIAGNOSIS — M5136 Other intervertebral disc degeneration, lumbar region: Secondary | ICD-10-CM

## 2022-02-08 DIAGNOSIS — M51369 Other intervertebral disc degeneration, lumbar region without mention of lumbar back pain or lower extremity pain: Secondary | ICD-10-CM

## 2022-02-08 DIAGNOSIS — G8929 Other chronic pain: Secondary | ICD-10-CM

## 2022-02-08 NOTE — Therapy (Incomplete)
OUTPATIENT PHYSICAL THERAPY SHOULDER TREATMENT  Patient Name: [name] MRN: [id number] DOB:[birth year], 67 y.o., male Today's Date: 02/08/2022    Past Medical History:  Diagnosis Date   Anxiety    Aortic atherosclerosis ([place])    Cardiomyopathy (in setting of Afib)    a.) TTE 12/26/2013: EF 45-50%, mild ant and antsept HK. mild MR. Mod dil LA. nl RV fxn. Rhythm was Afib; b.) TTE 07/04/2019: EF 55%, mid-apical anteroseptal HK, mild MAC, mild Ao sclerosis, G2DD, RVSP 45.3   Chronic pain syndrome    a.) followed by pain management   Chronic, continuous use of opioids    a.) hydrocodone/APAP 7.5/325 mg; followed by pain management   Coronary artery disease 11/06/2014   a.) cCTA 11/06/2014: Ca score 224 (all in pLAD) -- 74th percentile for age/sex matched control   Diverticulosis    DJD (degenerative joint disease) of knee    History of hiatal hernia    History of kidney stones 2012   Hyperlipidemia    Hyperplastic colon polyp    Hypertension    Internal hemorrhoids    Intervertebral disc disorder with radiculopathy of lumbosacral region    Long term current use FULL DOSE (325 mg) aspirin    Long term current use of amiodarone    Long term current use of antithrombotics/antiplatelets    a.) rivaroxaban   Lower extremity edema    Mixed hyperlipidemia    Myalgia due to statin    Osteoarthritis    PAF (paroxysmal atrial fibrillation) ([place])    a.) CHA2DS2VASc = 4 (age, HTN, CHF, vascular disease history);  b.) s/p DCCV 03/13/2014 (200 J x1); c.) s/p DCCV 05/15/2019 (150 J x 1, 200 J x2); d.) rate/rhythm maintained on oral amiodarone + metoprolol succinate; chronically anticoagulated with rivaroxaban   Pernicious anemia    Prostate cancer ([place])    Pulmonary nodule, right    a. 10/2014 Cardiac CTA: 16m RLL nodule; b. 04/2015 CT Chest: stable 758mRLL nodule. No new nodules; 02/2017 CTA Chest: stable, benign, 41m90mLL pulm nodule.   Sleep difficulties    a.) takes melatonin + trazodone  PRN   SVT (supraventricular tachycardia)    Tubular adenoma of colon    Past Surgical History:  Procedure Laterality Date   BICEPT TENODESIS Right 09/27/2021   Procedure: Right reverse shoulder arthroplasty, biceps tenodesis;  Surgeon: PatLeim FabryD;  Location: [facility];  Service: Orthopedics;  Laterality: Right;   CARDIOVERSION N/A 05/15/2019   Procedure: CARDIOVERSION;  Surgeon: GolMinna MerrittsD;  Location: [facility];  Service: Cardiovascular;  Laterality: N/A;   CARDIOVERSION N/A 03/13/2014   Procedure: CARDIOVERSION; [facility] TimIda RogueD   CARPAL TUNNEL RELEASE Right 12/23/2014   Procedure: CARPAL TUNNEL RELEASE;  Surgeon: HowEarnestine LeysD;  Location: [facility];  Service: Orthopedics;  Laterality: Right;   CARPAL TUNNEL RELEASE Left 01/06/2015   Procedure: CARPAL TUNNEL RELEASE;  Surgeon: HowEarnestine LeysD;  Location: [facility];  Service: Orthopedics;  Laterality: Left;   COLONOSCOPY WITH PROPOFOL N/A 10/08/2017   Procedure: COLONOSCOPY WITH PROPOFOL;  Surgeon: VanLin LandsmanD;  [facility];  Service: Gastroenterology;  Laterality: N/A;   GASTRIC BYPASS  01/17/2000   KNEE SURGERY Right    knee trauma x3   prostate seeding     REVERSE SHOULDER ARTHROPLASTY Right 09/27/2021   Procedure: Right reverse shoulder arthroplasty, biceps tenodesis;  Surgeon: PatLeim FabryD;  Location: [facility];  Service: Orthopedics;  Laterality: Right;   SHOULDER  ARTHROSCOPY WITH ROTATOR CUFF REPAIR AND OPEN BICEPS TENODESIS Left 12/27/2021   Procedure: Left shoulder arthroscopic cuff repair (supraspinatus and subscapularis) with Regeneten Patch application;  Surgeon: Leim Fabry, MD;  Location: [facility];  Service: Orthopedics;  Laterality: Left;   Patient Active Problem List   Diagnosis Date Noted   S/p reverse total shoulder arthroplasty 09/27/2021   Lesion of bone of lumbosacral spine (L5) 05/12/2021   Localized osteoarthritis of shoulder regions,  bilateral 03/29/2021   Chronic pain of both shoulders 03/29/2021   Drug-induced myopathy 02/21/2021   Trigger finger of right hand 11/15/2020   Prostate cancer ([place]) 07/26/2020   Insomnia 08/01/2019   Primary osteoarthritis of both wrists 02/17/2019   Trigger middle finger of left hand 02/17/2019   Chronic pain syndrome 02/17/2019   Chronic radicular lumbar pain 10/08/2018   Lumbar radiculopathy 10/08/2018   Lumbar degenerative disc disease 10/08/2018   Lumbar facet arthropathy 10/08/2018   Lumbar facet joint syndrome 10/08/2018   Intervertebral disc disorder with radiculopathy of lumbosacral region    Pernicious anemia 05/22/2016   Paroxysmal atrial fibrillation ([place]) 12/20/2015   DJD (degenerative joint disease) of knee 10/27/2014   Bilateral carpal tunnel syndrome 10/27/2014   Morbid obesity with BMI of 50.0-59.9, adult ([place]) 10/27/2014   Mixed hyperlipidemia 10/27/2014   H/O gastric bypass 03/20/2014   Hyperkalemia 03/20/2014   History of prostate cancer 12/26/2013   Essential hypertension 12/26/2013   Encounter for anticoagulation discussion and counseling 12/26/2013   PCP: Olin Hauser, DO  REFERRING PROVIDER: Nobie Putnam *  REFERRING DIAGNOSIS: Incomplete tear of left rotator cuff, unspecified whether traumatic M75.112   THERAPY DIAG: Chronic left shoulder pain  Chronic right shoulder pain  Muscle weakness (generalized)  RATIONALE FOR EVALUATION AND TREATMENT: Rehabilitation  ONSET DATE: Surgery 12/27/21, Chronic L shoulder pain for multiple years  FOLLOW UP APPT WITH PROVIDER: Yes    FROM INITIAL EVALUATION SUBJECTIVE:                                                                                                                                                                                         Chief Complaint: S/p L shoulder RTC repair, biceps tenodesis, and shoulder debridement  Pertinent History Pt with history of chronic  bilateral shoulder pain. He underwent right reverse TSR and right biceps tenodesis on 09/27/21. He experienced post-operative instability in R shoulder however it improved and he was able to avoid a revision. On 12/27/21 he underwent left arthroscopic rotator cuff repair (subscapularis (full-thickness), supraspinatus (partial-thickness) side-to-side repair + Regeneten patch), left arthroscopic biceps tenodesis, and left extensive debridement of shoulder (glenohumeral and subacromial spaces). Per surgical note  other intraoperative findings include Type 2 SLAP tear, degenerative anterior labral tearing, grade 2 degenerative changes to articular cartilage of humeral head, and significant posterior capsule synovitis. No significant post-operative complications. He arrives without abduction pillow and states that Dr. Posey Pronto advised him he doesn't have to wear the abduction pillow. He got behind on his pain medication today and states that today his shoulder has been the most painful he has experienced since surgery. He also noticed swelling in L lateral upper arm this morning. He did take his pain medication at 14:00, before PT evaluation.  He has a history of chronic back pain and underwent a lumbar facet, medial branch radiofrequency ablation on 01/02/22. At the time of the evaluation pt has not yet appreciated improvement in his back pain from this procedure. He has a past medical history of Cardiomyopathy (in setting of Afib), DJD (degenerative joint disease) of knee, History of kidney stones, Intervertebral disc disorder with radiculopathy of lumbosacral region, Kidney stone (2012), Lower extremity edema, PAF (paroxysmal atrial fibrillation) ([place]), Pernicious anemia, Prostate cancer ([place]), Pulmonary nodule, right, and SVT (supraventricular tachycardia) ([place]). He also  has a past surgical history that includes Knee surgery; Gastric bypass (2002); prostate seeding; Carpal tunnel release (Right, 12/23/2014); Carpal tunnel  release (Left, 01/06/2015); Colonoscopy with propofol (N/A, 10/08/2017); and Cardioversion (N/A, 05/15/2019).   Pain:  Pain Intensity: Present: 5/10, Best: 3/10, Worst: 10/10 Pain location: Anterior L shoulder Pain Quality: aching and throbbing Radiating: No  Numbness/Tingling: No Focal Weakness: Yes History of prior shoulder or neck/shoulder injury, pain, surgery, or therapy: Yes, chronic bilateral shoulder pain with R TSR. 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 No AROM or WB through LUE (per protocol), Passive elevation and ER in staged ROM to begin at week 4 (elevation 90 degrees weeks 4-6 with no ER; 0-120 weeks 6-8 with ER 0-30 degrees). No pulleys or cane until after 6 weeks, recommend no driving x 6-8 weeks.   Weight Bearing Restrictions: Yes No WB through LUE for at least 6 weeks  Living Environment Lives with: lives with their spouse Lives in: House/apartment  Patient Goals: Improve L shoulder strength and pain. Pt would like to be able to play with/lift his grandson as well as take care of his property.   OBJECTIVE:   Patient Surveys  FOTO: To be completed QuickDASH: 47.7%  Cognition Patient is oriented to person, place, and time.  Recent memory is intact.  Remote memory is intact.  Attention span and concentration are intact.  Expressive speech is intact.  Patient's fund of knowledge is within normal limits for educational level.    Gross Musculoskeletal  Assessment Tremor: None Bulk: Mild swelling noted in L lateral upper arm. Pt denies chills, fevers, redness, or dischrage from inicisions Tone: Normal  Gait Deferred  Posture Forward head and rounded shoulders  Cervical Screen Deferred  AROM Full L wrist AROM flexion, extension, radial deviation, and ulnar deviation. MCP flexion and extension WNL. Unable to assess L shoulder and elbow AROM per protocol  PROM Full L elbow PROM flexion and extension however during first attempt at slow passive  extension pt reports sudden pain in L shoulder which eventually improve after a couple minutes of rest. Full L elbow PROM pronation/supination.   LE MMT: Full L wrist strength for flexion, extension, radial deviation, and ulnar deviation. L grip strength is intact and strong. Deferred testing of L elbow and shoulder strength per protocol  Sensation Grossly intact to light touch throughout LUE as determined by testing dermatomes C2-T2. Proprioception and hot/cold testing deferred on this date.  Reflexes Deferred  Palpation Pt is generally tender to light palpation around anterior, lateral, and posterior L shoulder;  Repeated Movements Deferred  Passive Accessory Intervertebral Motion Deferred  Special Testing Deferred   TODAY'S TREATMENT    SUBJECTIVE: Pt states that he had a lot of soreness the evening after his last therapy session and for multiple days afterwards. He has had to return to taking his pain medication more frequently due to the pain. He denies any further episodes of subluxation/dislocation of his R shoulder. No acute trauma reported to left shoulder. He has also been having bilateral hand numbness. Pt with prior history of carpal tunnel surgery bilaterally.   PAIN: Bilateral shoulder pain, not asked to rate today    Ther-ex:  All exercises performed in semirecumbent position with support under both arms to prevent shoulder extension: L wrist AROM flexion,  extension, radial deviation, and ulnar deviation with manual resistance 2 x 10 each direction with the exception of only 1 set for wrist flexion due to increase in L shoulder pain; R shoulder flexion from neutral to 90 degrees with 2# dumbbell x 10; R shoulder serratus punch with 2# dumbbell x 10; R shoulder circles with 2# dumbbell CW/CCW x 10 each direction; R elbow flexion with 2# dumbbell 2 x 10; R elbow extension with manual resistance from therapist 2 x 10; R shoulder internal rotation from forearm vertical to stomach with manual resistance from therapist x 10; R shoulder external rotation from stomach to forearm vertical with manual resistance from therapist 2 reps performed and then discontinued secondary to pain;   Manual Therapy  L elbow PROM flexion/extension x multiple bouts; L elbow PROM pronation/supination x multiple bouts;    PATIENT EDUCATION:  Education details:  Pt educated throughout session about proper posture and technique with exercises. Improved exercise technique, movement at target joints, use of target muscles after min to mod verbal, visual, tactile cues.  Person educated: Patient Education method: Explanation Education comprehension: verbalized understanding and returned demonstration   HOME EXERCISE PROGRAM: Access Code: [email or web address] URL: [email or web address] Date: 01/10/2022 Prepared by: Roxana Hires  Exercises - Wrist Flexion AROM  - 2 x daily - 7 x weekly - 2 sets - 10 reps - 3s hold - Wrist Extension AROM  - 2 x daily - 7 x weekly - 2 sets - 10 reps - 3s hold - Seated Scapular Retraction  - 2 x daily - 7 x weekly - 2 sets - 10 reps - 3s hold - Supported Elbow Flexion Extension PROM  - 2 x daily - 7 x weekly - 2 sets - 10 reps - 3s hold - Forearm Supination PROM (Mirrored)  - 2 x daily - 7 x weekly - 2 sets - 10 reps - 3s hold - Forearm Pronation PROM (Mirrored)  - 2 x daily - 7 x weekly - 2 sets - 10 reps - 3s hold - Seated Gripping  Towel  - 2 x daily - 7 x weekly - 2 sets - 10 reps - 3s hold  ASSESSMENT:  CLINICAL IMPRESSION: Pt reporting higher level of bilateral shoulder pain upon arrival today.  PT continued with PROM for L elbow and AROM for L wrist/hand. Continued with R shoulder strengthening but decreased to 2# dumbbells today due to recent increase in his pain. Session shortened today due to higher level of pain after last session and upon arrival today. He has a follow-up appointment scheduled with cardiology to discuss his afib. Pt encouraged to continue HEP and follow-up as scheduled. Plan to progress LUE per protocol as pt is able to tolerate. He will benefit from skilled PT to address deficits in range of motion and strength in order to improve overall function at home and with leisure activities such as caring for his property.   REHAB POTENTIAL: Good  CLINICAL DECISION MAKING: Evolving/moderate complexity  EVALUATION COMPLEXITY: Moderate   GOALS: Goals reviewed with patient? Yes  SHORT TERM GOALS: Target date: 02/14/22  Pt will be independent with HEP to improve strength and decrease right shoulder pain to improve pain-free function at home and work. Baseline:  Goal status: INITIAL   LONG TERM GOALS: Target date: 03/28/22  Pt will increase L shoulder FOTO to at least 51 to demonstrate significant improvement in function at home and with leisure activities related to L shoulder. Baseline: 01/03/22: To be completed; 01/24/22: 4 Goal status: INITIAL  2.  Pt will decrease worst L shoulder pain by at least 3 points on the NPRS in order to demonstrate clinically significant reduction in R shoulder pain. Baseline: 01/03/22: worst: 10/10 Goal status: INITIAL  3.  Pt will decrease quick DASH score by at least 8% in order to demonstrate clinically significant reduction in disability related to L shoulder pain        Baseline: 01/03/22: 47.7% Goal status: INITIAL  4. Pt will demonstrate L shoulder  flexion, abduction, IR, and ER strength of at least 4/5 in order to demonstrate improvement in strength and function       Baseline: 01/03/22: Unable to test per protocol Goal status: INITIAL   PLAN: PT FREQUENCY: 1-2x/week  PT DURATION: 12 weeks  PLANNED INTERVENTIONS: Therapeutic exercises, Therapeutic activity, Neuromuscular re-education, Balance training, Gait training, Patient/Family education, Joint manipulation, Joint mobilization, Vestibular training, Canalith repositioning, Aquatic Therapy, Dry Needling, Electrical stimulation, Spinal manipulation, Spinal mobilization, Cryotherapy, Moist heat, Traction, Ultrasound, Ionotophoresis 77m/ml Dexamethasone, and Manual therapy  PLAN FOR NEXT SESSION: Progress L shoulder ROM and strength per protocol, continue R shoulder strengthening  JLyndel SafeHuprich PT, DPT, GCS  02/08/2022, 1:02 PM

## 2022-02-09 ENCOUNTER — Ambulatory Visit: Payer: Medicare Other

## 2022-02-09 ENCOUNTER — Other Ambulatory Visit: Payer: Medicare Other

## 2022-02-09 DIAGNOSIS — M2550 Pain in unspecified joint: Secondary | ICD-10-CM | POA: Diagnosis not present

## 2022-02-09 DIAGNOSIS — M25512 Pain in left shoulder: Secondary | ICD-10-CM | POA: Diagnosis not present

## 2022-02-09 DIAGNOSIS — M25511 Pain in right shoulder: Secondary | ICD-10-CM | POA: Diagnosis not present

## 2022-02-09 DIAGNOSIS — M5136 Other intervertebral disc degeneration, lumbar region: Secondary | ICD-10-CM | POA: Diagnosis not present

## 2022-02-09 DIAGNOSIS — M6281 Muscle weakness (generalized): Secondary | ICD-10-CM | POA: Diagnosis not present

## 2022-02-09 DIAGNOSIS — M5416 Radiculopathy, lumbar region: Secondary | ICD-10-CM | POA: Diagnosis not present

## 2022-02-09 DIAGNOSIS — G8929 Other chronic pain: Secondary | ICD-10-CM

## 2022-02-09 DIAGNOSIS — R293 Abnormal posture: Secondary | ICD-10-CM | POA: Diagnosis not present

## 2022-02-09 DIAGNOSIS — M5459 Other low back pain: Secondary | ICD-10-CM | POA: Diagnosis not present

## 2022-02-09 NOTE — Therapy (Signed)
OUTPATIENT PHYSICAL THERAPY SHOULDER TREATMENT  Patient Name: [name] MRN: [id number] DOB:02-07-1955, 67 y.o., male Today's Date: 02/09/2022   PT End of Session - 02/09/22 1325     Visit Number 8    Number of Visits 25    Date for PT Re-Evaluation 03/28/22    Authorization Type [employer]    Authorization Time Period 01/03/22-03/28/22    PT Start Time 1315    PT Stop Time 8527    PT Time Calculation (min) 38 min    Activity Tolerance Patient tolerated treatment well;No increased pain    Behavior During Therapy WFL for tasks assessed/performed            Past Medical History:  Diagnosis Date   Anxiety    Aortic atherosclerosis ([place])    Cardiomyopathy (in setting of Afib)    a.) TTE 12/26/2013: EF 45-50%, mild ant and antsept HK. mild MR. Mod dil LA. nl RV fxn. Rhythm was Afib; b.) TTE 07/04/2019: EF 55%, mid-apical anteroseptal HK, mild MAC, mild Ao sclerosis, G2DD, RVSP 45.3   Chronic pain syndrome    a.) followed by pain management   Chronic, continuous use of opioids    a.) hydrocodone/APAP 7.5/325 mg; followed by pain management   Coronary artery disease 11/06/2014   a.) cCTA 11/06/2014: Ca score 224 (all in pLAD) -- 74th percentile for age/sex matched control   Diverticulosis    DJD (degenerative joint disease) of knee    History of hiatal hernia    History of kidney stones 2012   Hyperlipidemia    Hyperplastic colon polyp    Hypertension    Internal hemorrhoids    Intervertebral disc disorder with radiculopathy of lumbosacral region    Long term current use FULL DOSE (325 mg) aspirin    Long term current use of amiodarone    Long term current use of antithrombotics/antiplatelets    a.) rivaroxaban   Lower extremity edema    Mixed hyperlipidemia    Myalgia due to statin    Osteoarthritis    PAF (paroxysmal atrial fibrillation) ([place])    a.) CHA2DS2VASc = 4 (age, HTN, CHF, vascular disease history);  b.) s/p DCCV 03/13/2014 (200 J x1); c.) s/p DCCV  05/15/2019 (150 J x 1, 200 J x2); d.) rate/rhythm maintained on oral amiodarone + metoprolol succinate; chronically anticoagulated with rivaroxaban   Pernicious anemia    Prostate cancer ([place])    Pulmonary nodule, right    a. 10/2014 Cardiac CTA: 61m RLL nodule; b. 04/2015 CT Chest: stable 739mRLL nodule. No new nodules; 02/2017 CTA Chest: stable, benign, 79m479mLL pulm nodule.   Sleep difficulties    a.) takes melatonin + trazodone PRN   SVT (supraventricular tachycardia)    Tubular adenoma of colon    Past Surgical History:  Procedure Laterality Date   BICEPT TENODESIS Right 09/27/2021   Procedure: Right reverse shoulder arthroplasty, biceps tenodesis;  Surgeon: PatLeim FabryD;  Location: [facility];  Service: Orthopedics;  Laterality: Right;   CARDIOVERSION N/A 05/15/2019   Procedure: CARDIOVERSION;  Surgeon: GolMinna MerrittsD;  Location: [facility];  Service: Cardiovascular;  Laterality: N/A;   CARDIOVERSION N/A 03/13/2014   Procedure: CARDIOVERSION; [facility]: TimIda RogueD   CARPAL TUNNEL RELEASE Right 12/23/2014   Procedure: CARPAL TUNNEL RELEASE;  Surgeon: HowEarnestine LeysD;  Location: [facility];  Service: Orthopedics;  Laterality: Right;   CARPAL TUNNEL RELEASE Left 01/06/2015   Procedure: CARPAL TUNNEL RELEASE;  Surgeon: HowEarnestine Leys  MD;  Location: [facility];  Service: Orthopedics;  Laterality: Left;   COLONOSCOPY WITH PROPOFOL N/A 10/08/2017   Procedure: COLONOSCOPY WITH PROPOFOL;  Surgeon: Lin Landsman, MD;  Location: [facility];  Service: Gastroenterology;  Laterality: N/A;   GASTRIC BYPASS  01/17/2000   KNEE SURGERY Right    knee trauma x3   prostate seeding     REVERSE SHOULDER ARTHROPLASTY Right 09/27/2021   Procedure: Right reverse shoulder arthroplasty, biceps tenodesis;  Surgeon: Leim Fabry, MD;  Location: [facility];  Service: Orthopedics;  Laterality: Right;   SHOULDER ARTHROSCOPY WITH ROTATOR CUFF REPAIR AND OPEN BICEPS TENODESIS Left  12/27/2021   Procedure: Left shoulder arthroscopic cuff repair (supraspinatus and subscapularis) with Regeneten Patch application;  Surgeon: Leim Fabry, MD;  Location: [facility];  Service: Orthopedics;  Laterality: Left;   Patient Active Problem List   Diagnosis Date Noted   S/p reverse total shoulder arthroplasty 09/27/2021   Lesion of bone of lumbosacral spine (L5) 05/12/2021   Localized osteoarthritis of shoulder regions, bilateral 03/29/2021   Chronic pain of both shoulders 03/29/2021   Drug-induced myopathy 02/21/2021   Trigger finger of right hand 11/15/2020   Prostate cancer ([place]) 07/26/2020   Insomnia 08/01/2019   Primary osteoarthritis of both wrists 02/17/2019   Trigger middle finger of left hand 02/17/2019   Chronic pain syndrome 02/17/2019   Chronic radicular lumbar pain 10/08/2018   Lumbar radiculopathy 10/08/2018   Lumbar degenerative disc disease 10/08/2018   Lumbar facet arthropathy 10/08/2018   Lumbar facet joint syndrome 10/08/2018   Intervertebral disc disorder with radiculopathy of lumbosacral region    Pernicious anemia 05/22/2016   Paroxysmal atrial fibrillation ([place]) 12/20/2015   DJD (degenerative joint disease) of knee 10/27/2014   Bilateral carpal tunnel syndrome 10/27/2014   Morbid obesity with BMI of 50.0-59.9, adult ([place]) 10/27/2014   Mixed hyperlipidemia 10/27/2014   H/O gastric bypass 03/20/2014   Hyperkalemia 03/20/2014   History of prostate cancer 12/26/2013   Essential hypertension 12/26/2013   Encounter for anticoagulation discussion and counseling 12/26/2013   PCP: Olin Hauser, DO  REFERRING PROVIDER: Leim Fabry, MD  REFERRING DIAGNOSIS: Incomplete tear of left rotator cuff, unspecified whether traumatic M75.112   THERAPY DIAG: Chronic left shoulder pain  Chronic right shoulder pain  Muscle weakness (generalized)  RATIONALE FOR EVALUATION AND TREATMENT: Rehabilitation  ONSET DATE: Surgery 12/27/21, Chronic L shoulder  pain for multiple years  FOLLOW UP APPT WITH PROVIDER: Yes    SUBJECTIVE:  Chief Complaint: S/p L shoulder RTC repair, biceps tenodesis, and shoulder debridement  Pertinent History Pt with history of chronic bilateral shoulder pain. He underwent right reverse TSR and right biceps tenodesis on 09/27/21. He experienced post-operative instability in R shoulder however it improved and he was able to avoid a revision. On 12/27/21 he underwent left arthroscopic rotator cuff repair (subscapularis (full-thickness), supraspinatus (partial-thickness) side-to-side repair + Regeneten patch), left arthroscopic biceps tenodesis, and left extensive debridement of shoulder (glenohumeral and subacromial spaces). Per surgical note other intraoperative findings include Type 2 SLAP tear, degenerative anterior labral tearing, grade 2 degenerative changes to articular cartilage of humeral head, and significant posterior capsule synovitis. No significant post-operative complications. He arrives without abduction pillow and states that Dr. Posey Pronto advised him he doesn't have to wear the abduction pillow. He got behind on his pain medication today and states that today his shoulder has been the most painful he has experienced since surgery. He also noticed swelling in L lateral upper arm this morning. He did take his pain medication at 14:00, before PT evaluation.  He has a history of chronic back pain and underwent a lumbar facet, medial branch radiofrequency ablation on 01/02/22. At the time of the evaluation pt has not yet appreciated improvement in his back pain from this procedure. He has a past medical history of Cardiomyopathy (in setting of Afib), DJD (degenerative joint disease) of knee, History of kidney stones, Intervertebral disc disorder  with radiculopathy of lumbosacral region, Kidney stone (2012), Lower extremity edema, PAF (paroxysmal atrial fibrillation) ([place]), Pernicious anemia, Prostate cancer ([place]), Pulmonary nodule, right, and SVT (supraventricular tachycardia) ([place]). He also  has a past surgical history that includes Knee surgery; Gastric bypass (2002); prostate seeding; Carpal tunnel release (Right, 12/23/2014); Carpal tunnel release (Left, 01/06/2015); Colonoscopy with propofol (N/A, 10/08/2017); and Cardioversion (N/A, 05/15/2019).  TODAY'S TREATMENT    SUBJECTIVE: No updates today. Pt doing well in general. HEP is performed as issued.   PAIN: Bilateral shoulder pain: 3/10 Left, 1/10 Right  INTERVENTION THIS DATE: 02/09/22  All exercises performed in semi-recumbent position with support under both arms to prevent shoulder extension:  L wrist AROM circles 10x CW, 10x ACW, elbow 90 Left supination/pronation 10x each, left galaxy ball squeezes 1x10x5secH   R shoulder flexion from neutral to 90 degrees with 2# dumbbell 1x 10; R shoulder serratus punch with 2# dumbbell 1x 10; R shoulder circles with 2# dumbbell CW/CCW 1x 10 each direction; R elbow flexion with 2# dumbbell 1 x 10 neutral, 1x10 supinated R elbow skull crusher 1# FW 1 x 10;  R shoulder flexion from neutral to 90 degrees with 2# dumbbell 1x 10; R shoulder serratus punch with 2# dumbbell 1x 10; R shoulder circles with 2# dumbbell CW/CCW 1x 10 each direction; R elbow flexion with 2# dumbbell 1 x 10 neutral, 1x10 supinated R elbow skull crusher 1# FW 1 x 10;  Wand Flexion 1x10 BUE to 90ish degrees   R shoulder internal rotation from forearm vertical to stomach with manual resistance from therapist x 10; R shoulder external rotation from stomach to forearm vertical with manual resistance from therapist 2 reps performed and then discontinued secondary to pain;    PATIENT EDUCATION:  Education details:  Pt educated throughout session about proper posture  and technique with exercises. Improved exercise technique, movement at target joints, use of target muscles after min to mod verbal, visual, tactile cues.  Person educated: Patient Education method: Explanation Education comprehension: verbalized understanding and returned demonstration  HOME EXERCISE PROGRAM: Access Code: [email or web address] URL: [email or web address] Date: 01/10/2022 Prepared by: Roxana Hires  Exercises - Wrist Flexion AROM  - 2 x daily - 7 x weekly - 2 sets - 10 reps - 3s hold - Wrist Extension AROM  - 2 x daily - 7 x weekly - 2 sets - 10 reps - 3s hold - Seated Scapular Retraction  - 2 x daily - 7 x weekly - 2 sets - 10 reps - 3s hold - Supported Elbow Flexion Extension PROM  - 2 x daily - 7 x weekly - 2 sets - 10 reps - 3s hold - Forearm Supination PROM (Mirrored)  - 2 x daily - 7 x weekly - 2 sets - 10 reps - 3s hold - Forearm Pronation PROM (Mirrored)  - 2 x daily - 7 x weekly - 2 sets - 10 reps - 3s hold - Seated Gripping Towel  - 2 x daily - 7 x weekly - 2 sets - 10 reps - 3s hold   ASSESSMENT:  CLINICAL IMPRESSION: Pain levels more predictable after load regression last session. HEP going well. ROM conitues to present advanced for typical course. Pt encouraged to continue HEP and follow-up as scheduled. Plan to progress LUE per protocol as pt is able to tolerate. He will benefit from skilled PT to address deficits in range of motion and strength in order to improve overall function at home and with leisure activities such as caring for his property.   REHAB POTENTIAL: Good  CLINICAL DECISION MAKING: Evolving/moderate complexity  EVALUATION COMPLEXITY: Moderate   GOALS: Goals reviewed with patient? Yes  SHORT TERM GOALS: Target date: 02/14/22  Pt will be independent with HEP to improve strength and decrease right shoulder pain to improve pain-free function at home and work. Baseline:  Goal status: INITIAL   LONG TERM GOALS: Target date:  03/28/22  Pt will increase L shoulder FOTO to at least 51 to demonstrate significant improvement in function at home and with leisure activities related to L shoulder. Baseline: 01/03/22: To be completed; 01/24/22: 4 Goal status: INITIAL  2.  Pt will decrease worst L shoulder pain by at least 3 points on the NPRS in order to demonstrate clinically significant reduction in R shoulder pain. Baseline: 01/03/22: worst: 10/10 Goal status: INITIAL  3.  Pt will decrease quick DASH score by at least 8% in order to demonstrate clinically significant reduction in disability related to L shoulder pain        Baseline: 01/03/22: 47.7% Goal status: INITIAL  4. Pt will demonstrate L shoulder flexion, abduction, IR, and ER strength of at least 4/5 in order to demonstrate improvement in strength and function       Baseline: 01/03/22: Unable to test per protocol Goal status: INITIAL   PLAN: PT FREQUENCY: 1-2x/week  PT DURATION: 12 weeks  PLANNED INTERVENTIONS: Therapeutic exercises, Therapeutic activity, Neuromuscular re-education, Balance training, Gait training, Patient/Family education, Joint manipulation, Joint mobilization, Vestibular training, Canalith repositioning, Aquatic Therapy, Dry Needling, Electrical stimulation, Spinal manipulation, Spinal mobilization, Cryotherapy, Moist heat, Traction, Ultrasound, Ionotophoresis 18m/ml Dexamethasone, and Manual therapy  PLAN FOR NEXT SESSION: Progress L shoulder ROM and strength per protocol, continue R shoulder strengthening  1:37 PM, 02/09/22 AEtta Grandchild PT, DPT Physical Therapist [facility] [place])     02/09/2022, 1:36 PM

## 2022-02-12 LAB — SEDIMENTATION RATE: Sed Rate: 6 mm/h (ref 0–20)

## 2022-02-12 LAB — ANTI-NUCLEAR AB-TITER (ANA TITER): ANA Titer 1: 1:40 {titer} — ABNORMAL HIGH

## 2022-02-12 LAB — RHEUMATOID FACTOR: Rheumatoid fact SerPl-aCnc: <14 IU/mL (ref <14)

## 2022-02-12 LAB — CYCLIC CITRUL PEPTIDE ANTIBODY, IGG: Cyclic Citrullin Peptide Ab: <16 UNITS

## 2022-02-12 LAB — C-REACTIVE PROTEIN: CRP: 1.7 mg/L (ref <8.0)

## 2022-02-12 LAB — ANA: Anti Nuclear Antibody (ANA): POSITIVE — AB

## 2022-02-14 ENCOUNTER — Other Ambulatory Visit: Payer: Self-pay | Admitting: Family Medicine

## 2022-02-14 DIAGNOSIS — G8929 Other chronic pain: Secondary | ICD-10-CM

## 2022-02-14 DIAGNOSIS — R768 Other specified abnormal immunological findings in serum: Secondary | ICD-10-CM

## 2022-02-20 ENCOUNTER — Ambulatory Visit
Admission: RE | Admit: 2022-02-20 | Discharge: 2022-02-20 | Disposition: A | Discharge status: Home / Self Care | Payer: Medicare Other | Source: Ambulatory Visit | Attending: Surgery | Admitting: Surgery

## 2022-02-20 ENCOUNTER — Other Ambulatory Visit: Payer: Self-pay | Admitting: Cardiovascular Disease

## 2022-02-20 DIAGNOSIS — K8020 Calculus of gallbladder without cholecystitis without obstruction: Secondary | ICD-10-CM | POA: Diagnosis not present

## 2022-02-20 DIAGNOSIS — K5730 Diverticulosis of large intestine without perforation or abscess without bleeding: Secondary | ICD-10-CM | POA: Diagnosis not present

## 2022-02-20 DIAGNOSIS — R1032 Left lower quadrant pain: Secondary | ICD-10-CM | POA: Insufficient documentation

## 2022-02-21 ENCOUNTER — Ambulatory Visit: Payer: Medicare Other | Attending: Orthopedic Surgery

## 2022-02-21 DIAGNOSIS — M25512 Pain in left shoulder: Secondary | ICD-10-CM | POA: Insufficient documentation

## 2022-02-21 DIAGNOSIS — M25511 Pain in right shoulder: Secondary | ICD-10-CM | POA: Diagnosis not present

## 2022-02-21 DIAGNOSIS — M6281 Muscle weakness (generalized): Secondary | ICD-10-CM | POA: Diagnosis not present

## 2022-02-21 DIAGNOSIS — G8929 Other chronic pain: Secondary | ICD-10-CM | POA: Insufficient documentation

## 2022-02-21 NOTE — Therapy (Signed)
OUTPATIENT PHYSICAL THERAPY SHOULDER TREATMENT  Patient Name: [name] MRN: [id number] DOB:1956-01-10, 67 y.o., male Today's Date: 02/21/2022   PT End of Session - 02/21/22 1358     Visit Number 9    Number of Visits 25    Date for PT Re-Evaluation 03/28/22    Authorization Type [employer] Medicare    Authorization Time Period 01/03/22-03/28/22    PT Start Time 1400    PT Stop Time 1445    PT Time Calculation (min) 45 min    Activity Tolerance Patient tolerated treatment well;No increased pain    Behavior During Therapy WFL for tasks assessed/performed            Past Medical History:  Diagnosis Date   Anxiety    Aortic atherosclerosis ([place])    Cardiomyopathy (in setting of Afib)    a.) TTE 12/26/2013: EF 45-50%, mild ant and antsept HK. mild MR. Mod dil LA. nl RV fxn. Rhythm was Afib; b.) TTE 07/04/2019: EF 55%, mid-apical anteroseptal HK, mild MAC, mild Ao sclerosis, G2DD, RVSP 45.3   Chronic pain syndrome    a.) followed by pain management   Chronic, continuous use of opioids    a.) hydrocodone/APAP 7.5/325 mg; followed by pain management   Coronary artery disease 11/06/2014   a.) cCTA 11/06/2014: Ca score 224 (all in pLAD) -- 74th percentile for age/sex matched control   Diverticulosis    DJD (degenerative joint disease) of knee    History of hiatal hernia    History of kidney stones 2012   Hyperlipidemia    Hyperplastic colon polyp    Hypertension    Internal hemorrhoids    Intervertebral disc disorder with radiculopathy of lumbosacral region    Long term current use FULL DOSE (325 mg) aspirin    Long term current use of amiodarone    Long term current use of antithrombotics/antiplatelets    a.) rivaroxaban   Lower extremity edema    Mixed hyperlipidemia    Myalgia due to statin    Osteoarthritis    PAF (paroxysmal atrial fibrillation) ([place])    a.) CHA2DS2VASc = 4 (age, HTN, CHF, vascular disease history);  b.) s/p DCCV 03/13/2014 (200 J x1); c.) s/p DCCV  05/15/2019 (150 J x 1, 200 J x2); d.) rate/rhythm maintained on oral amiodarone + metoprolol succinate; chronically anticoagulated with rivaroxaban   Pernicious anemia    Prostate cancer ([place])    Pulmonary nodule, right    a. 10/2014 Cardiac CTA: 66m RLL nodule; b. 04/2015 CT Chest: stable 79mRLL nodule. No new nodules; 02/2017 CTA Chest: stable, benign, 27m71mLL pulm nodule.   Sleep difficulties    a.) takes melatonin + trazodone PRN   SVT (supraventricular tachycardia)    Tubular adenoma of colon    Past Surgical History:  Procedure Laterality Date   BICEPT TENODESIS Right 09/27/2021   Procedure: Right reverse shoulder arthroplasty, biceps tenodesis;  Surgeon: PatLeim FabryD;  Location: [facility];  Service: Orthopedics;  Laterality: Right;   CARDIOVERSION N/A 05/15/2019   Procedure: CARDIOVERSION;  Surgeon: GolMinna MerrittsD;  Location: [facility];  Service: Cardiovascular;  Laterality: N/A;   CARDIOVERSION N/A 03/13/2014   Procedure: CARDIOVERSION; [facility]: TimIda RogueD   CARPAL TUNNEL RELEASE Right 12/23/2014   Procedure: CARPAL TUNNEL RELEASE;  Surgeon: HowEarnestine LeysD;  Location: [facility];  Service: Orthopedics;  Laterality: Right;   CARPAL TUNNEL RELEASE Left 01/06/2015   Procedure: CARPAL TUNNEL RELEASE;  Surgeon: HowEarnestine Leys  MD;  Location: [facility];  Service: Orthopedics;  Laterality: Left;   COLONOSCOPY WITH PROPOFOL N/A 10/08/2017   Procedure: COLONOSCOPY WITH PROPOFOL;  Surgeon: Lin Landsman, MD;  Location: [facility];  Service: Gastroenterology;  Laterality: N/A;   GASTRIC BYPASS  01/17/2000   KNEE SURGERY Right    knee trauma x3   prostate seeding     REVERSE SHOULDER ARTHROPLASTY Right 09/27/2021   Procedure: Right reverse shoulder arthroplasty, biceps tenodesis;  Surgeon: Leim Fabry, MD;  Location: [facility];  Service: Orthopedics;  Laterality: Right;   SHOULDER ARTHROSCOPY WITH ROTATOR CUFF REPAIR AND OPEN BICEPS TENODESIS Left  12/27/2021   Procedure: Left shoulder arthroscopic cuff repair (supraspinatus and subscapularis) with Regeneten Patch application;  Surgeon: Leim Fabry, MD;  Location: [facility];  Service: Orthopedics;  Laterality: Left;   Patient Active Problem List   Diagnosis Date Noted   S/p reverse total shoulder arthroplasty 09/27/2021   Lesion of bone of lumbosacral spine (L5) 05/12/2021   Localized osteoarthritis of shoulder regions, bilateral 03/29/2021   Chronic pain of both shoulders 03/29/2021   Drug-induced myopathy 02/21/2021   Trigger finger of right hand 11/15/2020   Prostate cancer ([place]) 07/26/2020   Insomnia 08/01/2019   Primary osteoarthritis of both wrists 02/17/2019   Trigger middle finger of left hand 02/17/2019   Chronic pain syndrome 02/17/2019   Chronic radicular lumbar pain 10/08/2018   Lumbar radiculopathy 10/08/2018   Lumbar degenerative disc disease 10/08/2018   Lumbar facet arthropathy 10/08/2018   Lumbar facet joint syndrome 10/08/2018   Intervertebral disc disorder with radiculopathy of lumbosacral region    Pernicious anemia 05/22/2016   Paroxysmal atrial fibrillation ([place]) 12/20/2015   DJD (degenerative joint disease) of knee 10/27/2014   Bilateral carpal tunnel syndrome 10/27/2014   Morbid obesity with BMI of 50.0-59.9, adult ([place]) 10/27/2014   Mixed hyperlipidemia 10/27/2014   H/O gastric bypass 03/20/2014   Hyperkalemia 03/20/2014   History of prostate cancer 12/26/2013   Essential hypertension 12/26/2013   Encounter for anticoagulation discussion and counseling 12/26/2013   PCP: Olin Hauser, DO  REFERRING PROVIDER: Leim Fabry, MD  REFERRING DIAGNOSIS: Incomplete tear of left rotator cuff, unspecified whether traumatic M75.112   THERAPY DIAG: Chronic left shoulder pain  Chronic right shoulder pain  Muscle weakness (generalized)  RATIONALE FOR EVALUATION AND TREATMENT: Rehabilitation  ONSET DATE: Surgery 12/27/21, Chronic L shoulder  pain for multiple years  FOLLOW UP APPT WITH PROVIDER: Yes    FROM INITIAL EVALUATION SUBJECTIVE:  Chief Complaint: S/p L shoulder RTC repair, biceps tenodesis, and shoulder debridement  Pertinent History Pt with history of chronic bilateral shoulder pain. He underwent right reverse TSR and right biceps tenodesis on 09/27/21. He experienced post-operative instability in R shoulder however it improved and he was able to avoid a revision. On 12/27/21 he underwent left arthroscopic rotator cuff repair (subscapularis (full-thickness), supraspinatus (partial-thickness) side-to-side repair + Regeneten patch), left arthroscopic biceps tenodesis, and left extensive debridement of shoulder (glenohumeral and subacromial spaces). Per surgical note other intraoperative findings include Type 2 SLAP tear, degenerative anterior labral tearing, grade 2 degenerative changes to articular cartilage of humeral head, and significant posterior capsule synovitis. No significant post-operative complications. He arrives without abduction pillow and states that Dr. Posey Pronto advised him he doesn't have to wear the abduction pillow. He got behind on his pain medication today and states that today his shoulder has been the most painful he has experienced since surgery. He also noticed swelling in L lateral upper arm this morning. He did take his pain medication at 14:00, before PT evaluation.  He has a history of chronic back pain and underwent a lumbar facet, medial branch radiofrequency ablation on 01/02/22. At the time of the evaluation pt has not yet appreciated improvement in his back pain from this procedure. He has a past medical history of Cardiomyopathy (in setting of Afib), DJD (degenerative joint disease) of knee, History of kidney stones,  Intervertebral disc disorder with radiculopathy of lumbosacral region, Kidney stone (2012), Lower extremity edema, PAF (paroxysmal atrial fibrillation) ([place]), Pernicious anemia, Prostate cancer ([place]), Pulmonary nodule, right, and SVT (supraventricular tachycardia) ([place]). He also  has a past surgical history that includes Knee surgery; Gastric bypass (2002); prostate seeding; Carpal tunnel release (Right, 12/23/2014); Carpal tunnel release (Left, 01/06/2015); Colonoscopy with propofol (N/A, 10/08/2017); and Cardioversion (N/A, 05/15/2019).   Pain:  Pain Intensity: Present: 5/10, Best: 3/10, Worst: 10/10 Pain location: Anterior L shoulder Pain Quality: aching and throbbing Radiating: No  Numbness/Tingling: No Focal Weakness: Yes History of prior shoulder or neck/shoulder injury, pain, surgery, or therapy: Yes, chronic bilateral shoulder pain with R TSR. 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 No AROM or WB through LUE (per protocol), Passive elevation and ER in staged ROM to begin at week 4 (elevation 90 degrees weeks 4-6 with no ER; 0-120 weeks 6-8 with ER 0-30 degrees). No pulleys or cane until after 6 weeks, recommend no driving x 6-8 weeks.   Weight Bearing Restrictions: Yes No WB through LUE for at least 6 weeks  Living Environment Lives with: lives with their spouse Lives in: House/apartment  Patient Goals: Improve L shoulder strength and pain. Pt would like to be able to play with/lift his  grandson as well as take care of his property.   OBJECTIVE:   Patient Surveys  FOTO: To be completed QuickDASH: 47.7%  Cognition Patient is oriented to person, place, and time.  Recent memory is intact.  Remote memory is intact.  Attention span and concentration are intact.  Expressive speech is intact.  Patient's fund of knowledge is within normal limits for educational  level.    Gross Musculoskeletal Assessment Tremor: None Bulk: Mild swelling noted in L lateral upper arm. Pt denies chills, fevers, redness, or dischrage from inicisions Tone: Normal  Posture Forward head and rounded shoulders  AROM Full L wrist AROM flexion, extension, radial deviation, and ulnar deviation. MCP flexion and extension WNL. Unable to assess L shoulder and elbow AROM per protocol  PROM Full L elbow PROM flexion and extension however during first attempt at slow passive extension pt reports sudden pain in L shoulder which eventually improve after a couple minutes of rest. Full L elbow PROM pronation/supination.   LE MMT: Full L wrist strength for flexion, extension, radial deviation, and ulnar deviation. L grip strength is intact and strong. Deferred testing of L elbow and shoulder strength per protocol  Sensation Grossly intact to light touch throughout LUE as determined by testing dermatomes C2-T2. Proprioception and hot/cold testing deferred on this date.  Palpation Pt is generally tender to light palpation around anterior, lateral, and posterior L shoulder;    TODAY'S TREATMENT    SUBJECTIVE: Pt reports continued bilateral shoulder pain. He had two episodes where he felt the "muscles slip" in the shoulder but denies any subluxation/dislocation of the R shoulder. Other than the intermittent pain no issues with the L shoulder. He has been having more bilateral hand numbness. Pt has a history of CTS bilaterally s/p surgery for both sides. Pt has been having L sided groin pain and had a CT of  his abdomen yesterday which revealed small fat containing L inguinal hernia. He is being referred to a [facility] at [facility] to discuss possible surgical repair.    PAIN: Bilateral shoulder pain, not asked to rate today;   TREATMENT  All exercises performed in semi-recumbent position;  R shoulder PROM for flexion, abduction, and ER within pain-free range never pushing through resistance; L shoulder AAROM cane flexion with bent elbows to around 110 degrees 2 x 10; L elbow AROM flexion x 10; L elbow manually resisted extension x 10; L shoulder isometric IR and ER at neutral with forearm vertical 3s hold x 5 each direction; R shoulder flexion from neutral to 90 degrees with 2# dumbbell 2 x 10; R shoulder serratus punch with 2# dumbbell 2 x 10; R shoulder circles with 2# dumbbell CW/CCW 2 x 10 each direction; R elbow flexion with 3# dumbbell 2 x 10; R elbow extension with manual resistance 2 x 10; R shoulder internal rotation from forearm vertical to stomach with manual resistance from therapist x 10; R shoulder external rotation from stomach to forearm vertical with manual resistance from therapist 2 x 10; R shoulder internal rotation from forearm vertical to stomach with manual resistance 2 x 10; Seated R shoulder flexion from neutral to 90 with 1# dumbbell x 8;   PATIENT EDUCATION:  Education details:  Pt educated throughout session about proper posture and technique with exercises. Improved exercise technique, movement at target joints, use of target muscles after min to mod verbal, visual, tactile cues.  Person educated: Patient Education method: Explanation Education comprehension: verbalized understanding and returned demonstration   HOME EXERCISE PROGRAM: Access Code: [email or web address] URL: [email or web address] Date: 01/10/2022 Prepared by: Roxana Hires  Exercises - Wrist Flexion AROM  - 2 x daily - 7 x weekly - 2 sets - 10 reps - 3s hold - Wrist Extension AROM  - 2 x  daily - 7 x weekly - 2 sets - 10 reps - 3s hold - Seated Scapular Retraction  - 2  x daily - 7 x weekly - 2 sets - 10 reps - 3s hold - Supported Elbow Flexion Extension PROM  - 2 x daily - 7 x weekly - 2 sets - 10 reps - 3s hold - Forearm Supination PROM (Mirrored)  - 2 x daily - 7 x weekly - 2 sets - 10 reps - 3s hold - Forearm Pronation PROM (Mirrored)  - 2 x daily - 7 x weekly - 2 sets - 10 reps - 3s hold - Seated Gripping Towel  - 2 x daily - 7 x weekly - 2 sets - 10 reps - 3s hold   ASSESSMENT:  CLINICAL IMPRESSION: Bilateral shoulders are making excellent progress today as evidenced by progress in both protocols. He is able to increase weight with RUE and progress to seated position for resisted flexion at the end of the session. Initiated AAROM for L shoulder today. Pt encouraged to continue HEP and follow-up as scheduled. Plan to progress LUE per protocol as pt is able to tolerate in addition to progressing resistance with RUE strengthening against gravity. He will benefit from skilled PT to address deficits in range of motion and strength in order to improve overall function at home and with leisure activities such as caring for his property.   REHAB POTENTIAL: Good  CLINICAL DECISION MAKING: Evolving/moderate complexity  EVALUATION COMPLEXITY: Moderate   GOALS: Goals reviewed with patient? Yes  SHORT TERM GOALS: Target date: 02/14/22  Pt will be independent with HEP to improve strength and decrease right shoulder pain to improve pain-free function at home and work. Baseline:  Goal status: INITIAL   LONG TERM GOALS: Target date: 03/28/22  Pt will increase L shoulder FOTO to at least 51 to demonstrate significant improvement in function at home and with leisure activities related to L shoulder. Baseline: 01/03/22: To be completed; 01/24/22: 4 Goal status: INITIAL  2.  Pt will decrease worst L shoulder pain by at least 3 points on the NPRS in order to demonstrate clinically  significant reduction in R shoulder pain. Baseline: 01/03/22: worst: 10/10 Goal status: INITIAL  3.  Pt will decrease quick DASH score by at least 8% in order to demonstrate clinically significant reduction in disability related to L shoulder pain        Baseline: 01/03/22: 47.7% Goal status: INITIAL  4. Pt will demonstrate L shoulder flexion, abduction, IR, and ER strength of at least 4/5 in order to demonstrate improvement in strength and function       Baseline: 01/03/22: Unable to test per protocol Goal status: INITIAL   PLAN: PT FREQUENCY: 1-2x/week  PT DURATION: 12 weeks  PLANNED INTERVENTIONS: Therapeutic exercises, Therapeutic activity, Neuromuscular re-education, Balance training, Gait training, Patient/Family education, Joint manipulation, Joint mobilization, Vestibular training, Canalith repositioning, Aquatic Therapy, Dry Needling, Electrical stimulation, Spinal manipulation, Spinal mobilization, Cryotherapy, Moist heat, Traction, Ultrasound, Ionotophoresis '4mg'$ /ml Dexamethasone, and Manual therapy  PLAN FOR NEXT SESSION: Progress L shoulder ROM and strength per protocol, continue R shoulder strengthening  Lyndel Safe Aarthi Uyeno PT, DPT, GCS  02/21/2022, 3:25 PM

## 2022-02-22 ENCOUNTER — Telehealth: Payer: Self-pay

## 2022-02-22 DIAGNOSIS — Z79899 Other long term (current) drug therapy: Secondary | ICD-10-CM | POA: Diagnosis not present

## 2022-02-22 DIAGNOSIS — R5382 Chronic fatigue, unspecified: Secondary | ICD-10-CM | POA: Diagnosis not present

## 2022-02-22 DIAGNOSIS — M2559 Pain in other specified joint: Secondary | ICD-10-CM | POA: Diagnosis not present

## 2022-02-22 DIAGNOSIS — M19041 Primary osteoarthritis, right hand: Secondary | ICD-10-CM | POA: Diagnosis not present

## 2022-02-22 DIAGNOSIS — M19042 Primary osteoarthritis, left hand: Secondary | ICD-10-CM | POA: Diagnosis not present

## 2022-02-22 DIAGNOSIS — M2550 Pain in unspecified joint: Secondary | ICD-10-CM | POA: Diagnosis not present

## 2022-02-22 NOTE — Telephone Encounter (Signed)
I believe there may be some confusion or he may need more clarification.  I ordered several labs from 1/28 that he completed and they were mostly normal except for one abnormality (weak positive ANA).  I spoke to the patient with his results and advised that next step is referral to Rheumatologist.  Please notify patient of the following:  I submitted the referral to [facility] Rheumatology back on 02/15/22.  I do not see the apt scheduled yet.  There are no other labs that I would order here, and no other follow up on my end.  I would ask him to call [facility] Rheumatology and check status of his referral next.  [facility] [place], [place] [address] Hours: M-Th 8-5pm / F 8-12noon Phone: [phone]    Nobie Putnam, [facility] 02/22/2022, 1:55 PM

## 2022-02-22 NOTE — Telephone Encounter (Signed)
Copied from [place] [id number]. Topic: General - Other >> Feb 21, 2022  1:50 PM [place] B wrote: Reason for CRM: Patient called states Dr Raliegh Ip wanted to fu and do more fu on labs he had from jan 28. Should he make another appt or will orders be put in?

## 2022-02-23 ENCOUNTER — Ambulatory Visit: Payer: Medicare Other

## 2022-02-24 ENCOUNTER — Ambulatory Visit (INDEPENDENT_AMBULATORY_CARE_PROVIDER_SITE_OTHER): Payer: Medicare Other | Admitting: Family Medicine

## 2022-02-24 ENCOUNTER — Ambulatory Visit (INDEPENDENT_AMBULATORY_CARE_PROVIDER_SITE_OTHER): Payer: Medicare Other

## 2022-02-24 ENCOUNTER — Encounter: Payer: Self-pay | Admitting: Family Medicine

## 2022-02-24 VITALS — BP 148/90 | Ht 72.0 in | Wt 335.0 lb

## 2022-02-24 VITALS — Ht 72.0 in | Wt 335.0 lb

## 2022-02-24 DIAGNOSIS — Z0000 Encounter for general adult medical examination without abnormal findings: Secondary | ICD-10-CM

## 2022-02-24 DIAGNOSIS — I4819 Other persistent atrial fibrillation: Secondary | ICD-10-CM

## 2022-02-24 DIAGNOSIS — E559 Vitamin D deficiency, unspecified: Secondary | ICD-10-CM | POA: Insufficient documentation

## 2022-02-24 NOTE — Progress Notes (Signed)
Subjective:   [name] is a 67 y.o. male who presents for Medicare Annual/Subsequent preventive examination.  Review of Systems     Cardiac Risk Factors include: advanced age (>56mn, >>9women);male gender;hypertension;obesity (BMI >30kg/m2)     Objective:    Today's Vitals   02/24/22 1452 02/24/22 1453  BP: (!) 148/90   Weight: (!) 335 lb (152 kg)   Height: 6' (1.829 m)   PainSc:  4    Body mass index is 45.43 kg/m.     02/24/2022    3:07 PM 12/20/2021    4:11 PM 12/07/2021   10:01 AM 11/08/2021   12:34 PM 11/03/2021    8:22 AM 10/12/2021   11:36 AM 09/27/2021   11:00 AM  Advanced Directives  Does Patient Have a Medical Advance Directive? No No No No No  No  Would patient like information on creating a medical advance directive? No - Patient declined     No - Patient declined No - Patient declined    Current Medications (verified) Outpatient Encounter Medications as of 02/24/2022  Medication Sig   amiodarone (PACERONE) 200 MG tablet Take 1 tablet (200 mg total) by mouth 2 (two) times daily.   aspirin EC 325 MG tablet Take 325 mg by mouth daily.   benazepril (LOTENSIN) 40 MG tablet TAKE ONE TABLET BY MOUTH ONE TIME DAILY (Patient taking differently: Take 40 mg by mouth every morning. TAKE ONE TABLET BY MOUTH ONE TIME DAILY)   busPIRone (BUSPAR) 10 MG tablet TAKE ONE TABLET BY MOUTH FOUR TIMES A DAY AS NEEDED FOR ANXIETY (Patient taking differently: Take 10 mg by mouth QID.)   carboxymethylcellulose (REFRESH PLUS) 0.5 % SOLN 1 drop 3 (three) times daily as needed.   cyanocobalamin (VITAMIN B12) 1000 MCG/ML injection Inject 1 mL (1,000 mcg total) into the skin every 30 (thirty) days. INJECT 1ML EVERY 30 DAYS AS DIRECTED   cyclobenzaprine (FLEXERIL) 10 MG tablet Take 10 mg by mouth as needed.   DULoxetine (CYMBALTA) 30 MG capsule TAKE ONE CAPSULE BY MOUTH ONE TIME DAILY   [START ON 03/21/2022] HYDROcodone-acetaminophen (NORCO) 7.5-325 MG tablet Take 1 tablet by mouth every 6  (six) hours as needed for severe pain. Must last 30 days   melatonin 3 MG TABS tablet Take 3 mg by mouth at bedtime.   metoprolol succinate (TOPROL-XL) 50 MG 24 hr tablet TAKE ONE TABLET BY MOUTH ONE TIME DAILY WITH OR IMMEDIATELY FOLLOWING A MEAL   Multiple Vitamin (MULTIVITAMIN WITH MINERALS) TABS tablet Take 1 tablet by mouth daily.   rivaroxaban (XARELTO) 20 MG TABS tablet TAKE ONE TABLET BY MOUTH ONE TIME DAILY WITH SUPPER   traZODone (DESYREL) 150 MG tablet TAKE ONE TABLET BY MOUTH AT BEDTIME   amLODipine (NORVASC) 5 MG tablet Take 1 tablet (5 mg total) by mouth daily as needed. Please take as directed for systolic [id number](Patient not taking: Reported on 02/24/2022)   furosemide (LASIX) 20 MG tablet Take 1 tablet (20 mg total) by mouth daily as needed. (Patient not taking: Reported on 02/24/2022)   [DISCONTINUED] HYDROcodone-acetaminophen (NORCO) 7.5-325 MG tablet Take 1 tablet by mouth every 6 (six) hours as needed for severe pain. Must last 30 days   [DISCONTINUED] HYDROcodone-acetaminophen (NORCO) 7.5-325 MG tablet Take 1 tablet by mouth every 6 (six) hours as needed for severe pain. Must last 30 days   No facility-administered encounter medications on file as of 02/24/2022.    Allergies (verified) Bupivacaine liposome   History: Past Medical  History:  Diagnosis Date   Anxiety    Aortic atherosclerosis ([place])    Cardiomyopathy (in setting of Afib)    a.) TTE 12/26/2013: EF 45-50%, mild ant and antsept HK. mild MR. Mod dil LA. nl RV fxn. Rhythm was Afib; b.) TTE 07/04/2019: EF 55%, mid-apical anteroseptal HK, mild MAC, mild Ao sclerosis, G2DD, RVSP 45.3   Chronic pain syndrome    a.) followed by pain management   Chronic, continuous use of opioids    a.) hydrocodone/APAP 7.5/325 mg; followed by pain management   Coronary artery disease 11/06/2014   a.) cCTA 11/06/2014: Ca score 224 (all in pLAD) -- 74th percentile for age/sex matched control   Diverticulosis    DJD (degenerative joint  disease) of knee    History of hiatal hernia    History of kidney stones 2012   Hyperlipidemia    Hyperplastic colon polyp    Hypertension    Internal hemorrhoids    Intervertebral disc disorder with radiculopathy of lumbosacral region    Long term current use FULL DOSE (325 mg) aspirin    Long term current use of amiodarone    Long term current use of antithrombotics/antiplatelets    a.) rivaroxaban   Lower extremity edema    Mixed hyperlipidemia    Myalgia due to statin    Osteoarthritis    PAF (paroxysmal atrial fibrillation) ([place])    a.) CHA2DS2VASc = 4 (age, HTN, CHF, vascular disease history);  b.) s/p DCCV 03/13/2014 (200 J x1); c.) s/p DCCV 05/15/2019 (150 J x 1, 200 J x2); d.) rate/rhythm maintained on oral amiodarone + metoprolol succinate; chronically anticoagulated with rivaroxaban   Pernicious anemia    Prostate cancer ([place])    Pulmonary nodule, right    a. 10/2014 Cardiac CTA: 58m RLL nodule; b. 04/2015 CT Chest: stable 766mRLL nodule. No new nodules; 02/2017 CTA Chest: stable, benign, 49m22mLL pulm nodule.   Sleep difficulties    a.) takes melatonin + trazodone PRN   SVT (supraventricular tachycardia)    Tubular adenoma of colon    Past Surgical History:  Procedure Laterality Date   BICEPT TENODESIS Right 09/27/2021   Procedure: Right reverse shoulder arthroplasty, biceps tenodesis;  Surgeon: PatLeim FabryD;  Location: [facility];  Service: Orthopedics;  Laterality: Right;   CARDIOVERSION N/A 05/15/2019   Procedure: CARDIOVERSION;  Surgeon: GolMinna MerrittsD;  Location: [facility];  Service: Cardiovascular;  Laterality: N/A;   CARDIOVERSION N/A 03/13/2014   Procedure: CARDIOVERSION; [place] [facility] Tim[place]da Rog[place]   CARPAL TUNNEL RELEASE Right 12/23/2014   Procedure: CARPAL TUNNEL RELEASE;  Surgeon: HowEarnestine LeysD;  Location: [facility];  Service: Orthopedics;  Laterality: Right;   CARPAL TUNNEL RELEASE Left 01/06/2015   Procedure: CARPAL TUNNEL  RELEASE;  Surgeon: HowEarnestine LeysD;  Location: [facility];  Service: Orthopedics;  Laterality: Left;   COLONOSCOPY WITH PROPOFOL N/A 10/08/2017   Procedure: COLONOSCOPY WITH PROPOFOL;  Surgeon: VanLin LandsmanD;  [facility];  Service: Gastroenterology;  Laterality: N/A;   GASTRIC BYPASS  01/17/2000   KNEE SURGERY Right    knee trauma x3   prostate seeding     REVERSE SHOULDER ARTHROPLASTY Right 09/27/2021   Procedure: Right reverse shoulder arthroplasty, biceps tenodesis;  Surgeon: PatLeim FabryD;  Location: [facility];  Service: Orthopedics;  Laterality: Right;   SHOULDER ARTHROSCOPY WITH ROTATOR CUFF REPAIR AND OPEN BICEPS TENODESIS Left 12/27/2021   Procedure: Left shoulder arthroscopic cuff repair (supraspinatus and subscapularis) with Regeneten  Patch application;  Surgeon: Leim Fabry, MD;  Location: [facility];  Service: Orthopedics;  Laterality: Left;   Family History  Problem Relation Age of Onset   Heart disease Father    Hypertension Father    Stroke Father    Cancer Father    Arrhythmia Sister        A-fib   Arrhythmia Brother        A-fib   Social History   Socioeconomic History   Marital status: Married    Spouse name: Constance Holster   Number of children: Not on file   Years of education: Not on file   Highest education level: Not on file  Occupational History   Not on file  Tobacco Use   Smoking status: Former    Packs/day: 1.00    Years: 10.00    Total pack years: 10.00    Types: Cigarettes    Quit date: 02/18/1979    Years since quitting: 43.0   Smokeless tobacco: Former    Types: Snuff  Vaping Use   Vaping Use: Never used  Substance and Sexual Activity   Alcohol use: Not Currently    Comment: last drink in 2021   Drug use: No   Sexual activity: Not on file  Other Topics Concern   Not on file  Social History Narrative   Not on file   Social Determinants of Health   Financial Resource Strain: Low Risk  (02/24/2022)   Overall Financial  Resource Strain (CARDIA)    Difficulty of Paying Living Expenses: Not hard at all  Food Insecurity: No Food Insecurity (02/24/2022)   Hunger Vital Sign    Worried About Running Out of Food in the Last Year: Never true    [place] in the Last Year: Never true  Transportation Needs: No Transportation Needs (02/24/2022)   PRAPARE - Hydrologist (Medical): No    Lack of Transportation (Non-Medical): No  Physical Activity: Insufficiently Active (02/24/2022)   Exercise Vital Sign    Days of Exercise per Week: 2 days    Minutes of Exercise per Session: 60 min  Stress: No Stress Concern Present (02/24/2022)   [place]    Feeling of Stress : Not at all  Social Connections: Moderately Integrated (02/24/2022)   Social Connection and Isolation Panel [NHANES]    Frequency of Communication with Friends and Family: More than three times a week    Frequency of Social Gatherings with Friends and Family: More than three times a week    Attends Religious Services: More than 4 times per year    Active Member of [employer] or Organizations: No    Attends Music therapist: Never    Marital Status: Married    Tobacco Counseling Counseling given: Not Answered   Clinical Intake:  Pre-visit preparation completed: Yes  Pain : 0-10 Pain Score: 4  Pain Location: Wrist Pain Orientation: Right, Left     Nutritional Status: BMI > 30  Obese Nutritional Risks: None Diabetes: No  How often do you need to have someone help you when you read instructions, pamphlets, or other written materials from your doctor or pharmacy?: 1 - Never  Diabetic?no  Interpreter Needed?: No  Information entered by :: Kirke Shaggy, LPN   Activities of Daily Living    02/24/2022    3:08 PM 02/20/2022   10:52 AM  In your present state of health, do you  have any difficulty performing the following activities:  Hearing? 0  0  Vision? 0 0  Difficulty concentrating or making decisions? 0 0  Walking or climbing stairs? 1 1  Dressing or bathing? 0 0  Doing errands, shopping? 0 0  Preparing Food and eating ? N N  Using the Toilet? N N  In the past six months, have you accidently leaked urine? Y Y  Do you have problems with loss of bowel control? Y Y  Managing your Medications? N N  Managing your Finances? N N  Housekeeping or managing your Housekeeping? Tempie Donning    Patient Care Team: Olin Hauser, DO as PCP - General (Family Medicine) Minna Merritts, MD as PCP - Cardiology (Cardiology)  Indicate any recent Medical Services you may have received from other than [facility] providers in the past year (date may be approximate).     Assessment:   This is a routine wellness examination for [place].  Hearing/Vision screen Hearing Screening - Comments:: Wears aids Vision Screening - Comments:: Wears glasses- [place] [facility] Dr.[place]  Dietary issues and exercise activities discussed: Current Exercise Habits: Structured exercise class, Type of exercise: Other - see comments (P.T. for shoulders), Intensity: Mild   Goals Addressed             This Visit's Progress    DIET - INCREASE WATER INTAKE         Depression Screen    02/24/2022    3:04 PM 02/03/2022    3:21 PM 12/07/2021   10:00 AM 11/28/2021    1:24 PM 10/12/2021   11:36 AM 08/03/2021   10:36 AM 06/16/2021   10:27 AM  PHQ 2/9 Scores  PHQ - 2 Score 0 3 0 2 0 3 0  PHQ- 9 Score 0 14  9  14     $ Fall Risk    02/24/2022    3:08 PM 02/20/2022   10:52 AM 02/03/2022    3:21 PM 12/07/2021   10:00 AM 11/15/2021   11:22 AM  Fall Risk   Falls in the past year? 0 0 0 0 0  Number falls in past yr: 0  0  0  Injury with Fall? 0  0  0  Comment     stumbled some but did not fall  Risk for fall due to : No Fall Risks  No Fall Risks    Follow up Falls prevention discussed;Falls evaluation completed  Falls evaluation completed  Falls evaluation  completed;Falls prevention discussed  Comment     uses walker PRN    FALL RISK PREVENTION PERTAINING TO THE HOME:  Any stairs in or around the home? Yes  If so, are there any without handrails? No  Home free of loose throw rugs in walkways, pet beds, electrical cords, etc? Yes  Adequate lighting in your home to reduce risk of falls? Yes   ASSISTIVE DEVICES UTILIZED TO PREVENT FALLS:  Life alert? No  Use of a cane, walker or w/c? No  Grab bars in the bathroom? No  Shower chair or bench in shower? Yes  Elevated toilet seat or a handicapped toilet? No   TIMED UP AND GO:  Was the test performed? Yes .  Length of time to ambulate 10 feet: 4 sec.   Gait steady and fast without use of assistive device  Cognitive Function:        02/24/2022    3:14 PM  6CIT Screen  What Year? 0 points  What month?  0 points  What time? 0 points  Count back from 20 0 points  Months in reverse 0 points  Repeat phrase 0 points  Total Score 0 points    Immunizations Immunization History  Administered Date(s) Administered   Influenza Inj Mdck Quad Pf 12/12/2016   Influenza,inj,Quad PF,6+ Mos 11/06/2017   Moderna Sars-Covid-2 Vaccination 08/24/2019, 09/28/2019   Tdap 02/19/2012    TDAP status: Due, Education has been provided regarding the importance of this vaccine. Advised may receive this vaccine at local pharmacy or Health Dept. Aware to provide a copy of the vaccination record if obtained from local pharmacy or Health Dept. Verbalized acceptance and understanding.  Flu Vaccine status: Declined, Education has been provided regarding the importance of this vaccine but patient still declined. Advised may receive this vaccine at local pharmacy or Health Dept. Aware to provide a copy of the vaccination record if obtained from local pharmacy or Health Dept. Verbalized acceptance and understanding.  Pneumococcal vaccine status: Declined,  Education has been provided regarding the importance of  this vaccine but patient still declined. Advised may receive this vaccine at local pharmacy or Health Dept. Aware to provide a copy of the vaccination record if obtained from local pharmacy or Health Dept. Verbalized acceptance and understanding.   Covid-19 vaccine status: Completed vaccines  Qualifies for Shingles Vaccine? Yes   Zostavax completed No   Shingrix Completed?: No.    Education has been provided regarding the importance of this vaccine. Patient has been advised to call insurance company to determine out of pocket expense if they have not yet received this vaccine. Advised may also receive vaccine at local pharmacy or Health Dept. Verbalized acceptance and understanding.  Screening Tests Health Maintenance  Topic Date Due   COVID-19 Vaccine (3 - Moderna risk series) 10/26/2019   DTaP/Tdap/Td (2 - Td or Tdap) 02/18/2022   Zoster Vaccines- Shingrix (1 of 2) 02/28/2022 (Originally 08/15/1974)   INFLUENZA VACCINE  04/16/2022 (Originally 08/16/2021)   Pneumonia Vaccine 11+ Years old (1 of 1 - PCV) 11/29/2022 (Originally 08/16/2018)   COLONOSCOPY (Pts 45-77yr Insurance coverage will need to be confirmed)  10/09/2022   Medicare Annual Wellness (AWV)  02/25/2023   Hepatitis C Screening  Completed   HPV VACCINES  Aged Out    Health Maintenance  Health Maintenance Due  Topic Date Due   COVID-19 Vaccine (3 - Moderna risk series) 10/26/2019   DTaP/Tdap/Td (2 - Td or Tdap) 02/18/2022    Colorectal cancer screening: Type of screening: Colonoscopy. Completed 10/08/17. Repeat every 5 years  Lung Cancer Screening: (Low Dose CT Chest recommended if Age 69-80years, 30 pack-year currently smoking OR have quit w/in 15years.) does not qualify.    Additional Screening:  Hepatitis C Screening: does qualify; Completed 04/28/15  Vision Screening: Recommended annual ophthalmology exams for early detection of glaucoma and other disorders of the eye. Is the patient up to date with their annual eye  exam?  Yes  Who is the provider or what is the name of the office in which the patient attends annual eye exams[place] [place] not established with a provider, would they like to be referred to a provider to establish care? No .   Dental Screening: Recommended annual dental exams for proper oral hygiene  Community Resource Referral / Chronic Care Management: CRR required this visit?  No   CCM required this visit?  No      Plan:     I have personally reviewed and  noted the following in the patient's chart:   Medical and social history Use of alcohol, tobacco or illicit drugs  Current medications and supplements including opioid prescriptions. Patient is currently taking opioid prescriptions. Information provided to patient regarding non-opioid alternatives. Patient advised to discuss non-opioid treatment plan with their provider. Functional ability and status Nutritional status Physical activity Advanced directives List of other physicians Hospitalizations, surgeries, and ER visits in previous 12 months Vitals Screenings to include cognitive, depression, and falls Referrals and appointments  In addition, I have reviewed and discussed with patient certain preventive protocols, quality metrics, and best practice recommendations. A written personalized care plan for preventive services as well as general preventive health recommendations were provided to patient.     Dionisio David, LPN   [id number]   Nurse Notes: none

## 2022-02-24 NOTE — Patient Instructions (Signed)
[name] , Thank you for taking time to come for your Medicare Wellness Visit. I appreciate your ongoing commitment to your health goals. Please review the following plan we discussed and let me know if I can assist you in the future.   These are the goals we discussed:  Goals      DIET - EAT MORE FRUITS AND VEGETABLES     DIET - INCREASE WATER INTAKE        This is a list of the screening recommended for you and due dates:  Health Maintenance  Topic Date Due   COVID-19 Vaccine (3 - Moderna risk series) 10/26/2019   DTaP/Tdap/Td vaccine (2 - Td or Tdap) 02/18/2022   Zoster (Shingles) Vaccine (1 of 2) 02/28/2022*   Flu Shot  04/16/2022*   Pneumonia Vaccine (1 of 1 - PCV) 11/29/2022*   Colon Cancer Screening  10/09/2022   Medicare Annual Wellness Visit  02/25/2023   Hepatitis C Screening: USPSTF Recommendation to screen - Ages 18-79 yo.  Completed   HPV Vaccine  Aged Out  *Topic was postponed. The date shown is not the original due date.    Advanced directives: no  Conditions/risks identified: none  Next appointment: Follow up in one year for your annual wellness visit. 03/02/23 @ 1:30 pm in person  Preventive Care 65 Years and Older, Male  Preventive care refers to lifestyle choices and visits with your health care provider that can promote health and wellness. What does preventive care include? A yearly physical exam. This is also called an annual well check. Dental exams once or twice a year. Routine eye exams. Ask your health care provider how often you should have your eyes checked. Personal lifestyle choices, including: Daily care of your teeth and gums. Regular physical activity. Eating a healthy diet. Avoiding tobacco and drug use. Limiting alcohol use. Practicing safe sex. Taking low doses of aspirin every day. Taking vitamin and mineral supplements as recommended by your health care provider. What happens during an annual well check? The services and  screenings done by your health care provider during your annual well check will depend on your age, overall health, lifestyle risk factors, and family history of disease. Counseling  Your health care provider may ask you questions about your: Alcohol use. Tobacco use. Drug use. Emotional well-being. Home and relationship well-being. Sexual activity. Eating habits. History of falls. Memory and ability to understand (cognition). Work and work Statistician. Screening  You may have the following tests or measurements: Height, weight, and BMI. Blood pressure. Lipid and cholesterol levels. These may be checked every 5 years, or more frequently if you are over 64 years old. Skin check. Lung cancer screening. You may have this screening every year starting at age 39 if you have a 30-pack-year history of smoking and currently smoke or have quit within the past 15 years. Fecal occult blood test (FOBT) of the stool. You may have this test every year starting at age 27. Flexible sigmoidoscopy or colonoscopy. You may have a sigmoidoscopy every 5 years or a colonoscopy every 10 years starting at age 43. Prostate cancer screening. Recommendations will vary depending on your family history and other risks. Hepatitis C blood test. Hepatitis B blood test. Sexually transmitted disease (STD) testing. Diabetes screening. This is done by checking your blood sugar (glucose) after you have not eaten for a while (fasting). You may have this done every 1-3 years. Abdominal aortic aneurysm (AAA) screening. You may need this if you are  a current or former smoker. Osteoporosis. You may be screened starting at age 40 if you are at high risk. Talk with your health care provider about your test results, treatment options, and if necessary, the need for more tests. Vaccines  Your health care provider may recommend certain vaccines, such as: Influenza vaccine. This is recommended every year. Tetanus, diphtheria, and  acellular pertussis (Tdap, Td) vaccine. You may need a Td booster every 10 years. Zoster vaccine. You may need this after age 71. Pneumococcal 13-valent conjugate (PCV13) vaccine. One dose is recommended after age 26. Pneumococcal polysaccharide (PPSV23) vaccine. One dose is recommended after age 85. Talk to your health care provider about which screenings and vaccines you need and how often you need them. This information is not intended to replace advice given to you by your health care provider. Make sure you discuss any questions you have with your health care provider. Document Released: 01/29/2015 Document Revised: 09/22/2015 Document Reviewed: 11/03/2014 Elsevier Interactive Patient Education  2017 [employer] Prevention in the Home Falls can cause injuries. They can happen to people of all ages. There are many things you can do to make your home safe and to help prevent falls. What can I do on the outside of my home? Regularly fix the edges of walkways and driveways and fix any cracks. Remove anything that might make you trip as you walk through a door, such as a raised step or threshold. Trim any bushes or trees on the path to your home. Use bright outdoor lighting. Clear any walking paths of anything that might make someone trip, such as rocks or tools. Regularly check to see if handrails are loose or broken. Make sure that both sides of any steps have handrails. Any raised decks and porches should have guardrails on the edges. Have any leaves, snow, or ice cleared regularly. Use sand or salt on walking paths during winter. Clean up any spills in your garage right away. This includes oil or grease spills. What can I do in the bathroom? Use night lights. Install grab bars by the toilet and in the tub and shower. Do not use towel bars as grab bars. Use non-skid mats or decals in the tub or shower. If you need to sit down in the shower, use a plastic, non-slip stool. Keep  the floor dry. Clean up any water that spills on the floor as soon as it happens. Remove soap buildup in the tub or shower regularly. Attach bath mats securely with double-sided non-slip rug tape. Do not have throw rugs and other things on the floor that can make you trip. What can I do in the bedroom? Use night lights. Make sure that you have a light by your bed that is easy to reach. Do not use any sheets or blankets that are too big for your bed. They should not hang down onto the floor. Have a firm chair that has side arms. You can use this for support while you get dressed. Do not have throw rugs and other things on the floor that can make you trip. What can I do in the kitchen? Clean up any spills right away. Avoid walking on wet floors. Keep items that you use a lot in easy-to-reach places. If you need to reach something above you, use a strong step stool that has a grab bar. Keep electrical cords out of the way. Do not use floor polish or wax that makes floors slippery. If you must  use wax, use non-skid floor wax. Do not have throw rugs and other things on the floor that can make you trip. What can I do with my stairs? Do not leave any items on the stairs. Make sure that there are handrails on both sides of the stairs and use them. Fix handrails that are broken or loose. Make sure that handrails are as long as the stairways. Check any carpeting to make sure that it is firmly attached to the stairs. Fix any carpet that is loose or worn. Avoid having throw rugs at the top or bottom of the stairs. If you do have throw rugs, attach them to the floor with carpet tape. Make sure that you have a light switch at the top of the stairs and the bottom of the stairs. If you do not have them, ask someone to add them for you. What else can I do to help prevent falls? Wear shoes that: Do not have high heels. Have rubber bottoms. Are comfortable and fit you well. Are closed at the toe. Do not  wear sandals. If you use a stepladder: Make sure that it is fully opened. Do not climb a closed stepladder. Make sure that both sides of the stepladder are locked into place. Ask someone to hold it for you, if possible. Clearly mark and make sure that you can see: Any grab bars or handrails. First and last steps. Where the edge of each step is. Use tools that help you move around (mobility aids) if they are needed. These include: Canes. Walkers. Scooters. Crutches. Turn on the lights when you go into a dark area. Replace any light bulbs as soon as they burn out. Set up your furniture so you have a clear path. Avoid moving your furniture around. If any of your floors are uneven, fix them. If there are any pets around you, be aware of where they are. Review your medicines with your doctor. Some medicines can make you feel dizzy. This can increase your chance of falling. Ask your doctor what other things that you can do to help prevent falls. This information is not intended to replace advice given to you by your health care provider. Make sure you discuss any questions you have with your health care provider. Document Released: 10/29/2008 Document Revised: 06/10/2015 Document Reviewed: 02/06/2014 Elsevier Interactive Patient Education  2017 [employer].

## 2022-02-24 NOTE — Patient Instructions (Addendum)
Thank you for coming to the office today.    Please schedule a Follow-up Appointment to: Return if symptoms worsen or fail to improve.  If you have any other questions or concerns, please feel free to call the office or send a message through [place]. You may also schedule an earlier appointment if necessary.  Additionally, you may be receiving a survey about your experience at our office within a few days to 1 week by e-mail or mail. We value your feedback.  Nobie Putnam, DO [facility]

## 2022-02-24 NOTE — Progress Notes (Signed)
Subjective:    Patient ID: [name], male    DOB: 06-02-1955, 67 y.o.   MRN: [id number]  [name] is a 67 y.o. male presenting on 02/24/2022 for Atrial Fibrillation  Patient presents for a same day appointment.   HPI  Persistent Atrial Fibrillation Last seen by Cardiology Dr Rockey Situ 02/07/22 on Amiodarone and medication, anticoagulation. EKG done at that time. Asked to return 2 weeks to PCP for EKG repeat and if still in AFib they would schedule follow up / cardioversion. He has had previously. Today he has no significant symptom concerns. He has not had racing HR or chest pain or dyspnea. On Xarelto, Amiodarone, Amlodipine, Benazepril, Aspirin, Metoprolol XL      02/24/2022    3:04 PM 02/03/2022    3:21 PM 12/07/2021   10:00 AM  Depression screen PHQ 2/9  Decreased Interest 0 2 0  Down, Depressed, Hopeless 0 1 0  PHQ - 2 Score 0 3 0  Altered sleeping 0 3   Tired, decreased energy 0 3   Change in appetite 0 3   Feeling bad or failure about yourself  0 1   Trouble concentrating 0 1   Moving slowly or fidgety/restless 0 0   Suicidal thoughts 0 0   PHQ-9 Score 0 14   Difficult doing work/chores Not difficult at all Not difficult at all     Social History   Tobacco Use   Smoking status: Former    Packs/day: 1.00    Years: 10.00    Total pack years: 10.00    Types: Cigarettes    Quit date: 02/18/1979    Years since quitting: 43.0   Smokeless tobacco: Former    Types: Snuff  Vaping Use   Vaping Use: Never used  Substance Use Topics   Alcohol use: Not Currently    Comment: last drink in 2021   Drug use: No    Review of Systems Per HPI unless specifically indicated above     Objective:    Ht 6' (1.829 m)   Wt (!) 335 lb (152 kg)   BMI 45.43 kg/m   Wt Readings from Last 3 Encounters:  02/24/22 (!) 335 lb (152 kg)  02/24/22 (!) 335 lb (152 kg)  02/07/22 (!) 333 lb (151 kg)    Physical Exam Vitals and nursing note reviewed.  Constitutional:       General: He is not in acute distress.    Appearance: He is well-developed. He is obese. He is not diaphoretic.     Comments: Well-appearing, comfortable, cooperative  HENT:     Head: Normocephalic and atraumatic.  Eyes:     General:        Right eye: No discharge.        Left eye: No discharge.     Conjunctiva/sclera: Conjunctivae normal.  Neck:     Thyroid: No thyromegaly.  Cardiovascular:     Rate and Rhythm: Normal rate. Rhythm irregular.     Pulses: Normal pulses.     Heart sounds: Normal heart sounds. No murmur heard. Pulmonary:     Effort: Pulmonary effort is normal. No respiratory distress.     Breath sounds: Normal breath sounds. No wheezing or rales.  Musculoskeletal:        General: Normal range of motion.     Cervical back: Normal range of motion and neck supple.  Lymphadenopathy:     Cervical: No cervical adenopathy.  Skin:    General: Skin  is warm and dry.     Findings: No erythema or rash.  Neurological:     Mental Status: He is alert and oriented to person, place, and time. Mental status is at baseline.  Psychiatric:        Behavior: Behavior normal.     Comments: Well groomed, good eye contact, normal speech and thoughts     EKG - performed in office today  Date: 02/24/22  Rate: 72  Rhythm: atrial fibrillation  QRS Axis: normal  Intervals: normal  ST/T Wave abnormalities: normal  Conduction Disutrbances:none  Additional Narrative Interpretation: None  Old EKG Reviewed: unchanged 02/07/22 Cardiology EKG    Results for orders placed or performed in visit on 02/08/22  Antinuclear Antib (ANA)  Result Value Ref Range   Anti Nuclear Antibody (ANA) POSITIVE (A) NEGATIVE  C-reactive protein  Result Value Ref Range   CRP 1.7 <8.0 mg/L  Sed Rate (ESR)  Result Value Ref Range   Sed Rate 6 0 - 20 mm/h  Rheumatoid Factor  Result Value Ref Range   Rhuematoid fact SerPl-aCnc 99991111 [id number] IU/mL  Cyclic citrul peptide antibody, IgG  Result Value Ref Range    Cyclic Citrullin Peptide Ab <16 UNITS  Anti-nuclear ab-titer (ANA titer)  Result Value Ref Range   ANA Titer 1 1:40 (H) titer   ANA Pattern 1 Nuclear, Homogeneous (A)       Assessment & Plan:   Problem List Items Addressed This Visit     Persistent atrial fibrillation ([place]) - Primary   Relevant Orders   EKG 12-Lead    Persistent Atrial Fibrillation No rapid rate Asymptomatic On Anticoag On rate control  Currently on Amiodarone per Cardiology Repeat EKG unchanged from 02/07/22 He will proceed w/ Cardiology plan for cardioversion and follow up Route chart back to Cardiology for review.  No orders of the defined types were placed in this encounter.     Follow up plan: Return if symptoms worsen or fail to improve.   Nobie Putnam, [facility] 02/24/2022, 4:23 PM

## 2022-02-24 NOTE — Telephone Encounter (Signed)
EKG done here today.

## 2022-02-27 ENCOUNTER — Telehealth: Payer: Self-pay | Admitting: Cardiovascular Disease

## 2022-02-27 NOTE — Telephone Encounter (Signed)
Patient states he had his echo with Dr. Marjorie Smolder) of [facility]. He just wanted to inform Dr. Rockey Situ of this and to have him review this information when able.

## 2022-02-27 NOTE — Telephone Encounter (Signed)
Pt stated he recently had and EKG performed at Dr. Neoma Laming office and wanted Dr. Rockey Situ to review. Nurse can see the order however EKG not uploaded yet.   Will forward to MD

## 2022-02-28 ENCOUNTER — Ambulatory Visit: Payer: Medicare Other

## 2022-02-28 DIAGNOSIS — M25512 Pain in left shoulder: Secondary | ICD-10-CM | POA: Diagnosis not present

## 2022-02-28 DIAGNOSIS — G8929 Other chronic pain: Secondary | ICD-10-CM | POA: Diagnosis not present

## 2022-02-28 DIAGNOSIS — M25511 Pain in right shoulder: Secondary | ICD-10-CM | POA: Diagnosis not present

## 2022-02-28 DIAGNOSIS — M6281 Muscle weakness (generalized): Secondary | ICD-10-CM

## 2022-02-28 NOTE — Therapy (Signed)
OUTPATIENT PHYSICAL THERAPY SHOULDER TREATMENT/PROGRESS NOTE  Dates of reporting period  01/03/22   to   02/28/22   Patient Name: [name] MRN: [id number] DOB:[birth year], 67 y.o., male Today's Date: 03/01/2022   PT End of Session - 02/28/22 1350     Visit Number 10    Number of Visits 25    Date for PT Re-Evaluation 03/28/22    Authorization Type [employer] Medicare    Authorization Time Period 01/03/22-03/28/22    PT Start Time 1351    PT Stop Time 1436    PT Time Calculation (min) 45 min    Activity Tolerance Patient tolerated treatment well;No increased pain    Behavior During Therapy WFL for tasks assessed/performed            Past Medical History:  Diagnosis Date   Anxiety    Aortic atherosclerosis ([place])    Cardiomyopathy (in setting of Afib)    a.) TTE 12/26/2013: EF 45-50%, mild ant and antsept HK. mild MR. Mod dil LA. nl RV fxn. Rhythm was Afib; b.) TTE 07/04/2019: EF 55%, mid-apical anteroseptal HK, mild MAC, mild Ao sclerosis, G2DD, RVSP 45.3   Chronic pain syndrome    a.) followed by pain management   Chronic, continuous use of opioids    a.) hydrocodone/APAP 7.5/325 mg; followed by pain management   Coronary artery disease 11/06/2014   a.) cCTA 11/06/2014: Ca score 224 (all in pLAD) -- 74th percentile for age/sex matched control   Diverticulosis    DJD (degenerative joint disease) of knee    History of hiatal hernia    History of kidney stones 2012   Hyperlipidemia    Hyperplastic colon polyp    Hypertension    Internal hemorrhoids    Intervertebral disc disorder with radiculopathy of lumbosacral region    Long term current use FULL DOSE (325 mg) aspirin    Long term current use of amiodarone    Long term current use of antithrombotics/antiplatelets    a.) rivaroxaban   Lower extremity edema    Mixed hyperlipidemia    Myalgia due to statin    Osteoarthritis    PAF (paroxysmal atrial fibrillation) ([place])    a.) CHA2DS2VASc = 4 (age, HTN, CHF, vascular  disease history);  b.) s/p DCCV 03/13/2014 (200 J x1); c.) s/p DCCV 05/15/2019 (150 J x 1, 200 J x2); d.) rate/rhythm maintained on oral amiodarone + metoprolol succinate; chronically anticoagulated with rivaroxaban   Pernicious anemia    Prostate cancer ([place])    Pulmonary nodule, right    a. 10/2014 Cardiac CTA: 426m RLL nodule; b. 04/2015 CT Chest: stable 7126mRLL nodule. No new nodules; 02/2017 CTA Chest: stable, benign, 26m526mLL pulm nodule.   Sleep difficulties    a.) takes melatonin + trazodone PRN   SVT (supraventricular tachycardia)    Tubular adenoma of colon    Past Surgical History:  Procedure Laterality Date   BICEPT TENODESIS Right 09/27/2021   Procedure: Right reverse shoulder arthroplasty, biceps tenodesis;  Surgeon: PatLeim FabryD;  Location: [facility];  Service: Orthopedics;  Laterality: Right;   CARDIOVERSION N/A 05/15/2019   Procedure: CARDIOVERSION;  Surgeon: GolMinna MerrittsD;  Location: [facility];  Service: Cardiovascular;  Laterality: N/A;   CARDIOVERSION N/A 03/13/2014   Procedure: CARDIOVERSION; [facility] Tim[facility]da Rog[facility]D   CARPAL TUNNEL RELEASE Right 12/23/2014   Procedure: CARPAL TUNNEL RELEASE;  Surgeon: HowEarnestine LeysD;  Location: [facility];  Service: Orthopedics;  Laterality: Right;  CARPAL TUNNEL RELEASE Left 01/06/2015   Procedure: CARPAL TUNNEL RELEASE;  Surgeon: Earnestine Leys, MD;  Location: [facility];  Service: Orthopedics;  Laterality: Left;   COLONOSCOPY WITH PROPOFOL N/A 10/08/2017   Procedure: COLONOSCOPY WITH PROPOFOL;  Surgeon: Lin Landsman, MD;  Location: [facility];  Service: Gastroenterology;  Laterality: N/A;   GASTRIC BYPASS  01/17/2000   KNEE SURGERY Right    knee trauma x3   prostate seeding     REVERSE SHOULDER ARTHROPLASTY Right 09/27/2021   Procedure: Right reverse shoulder arthroplasty, biceps tenodesis;  Surgeon: Leim Fabry, MD;  Location: [facility];  Service: Orthopedics;  Laterality: Right;   SHOULDER  ARTHROSCOPY WITH ROTATOR CUFF REPAIR AND OPEN BICEPS TENODESIS Left 12/27/2021   Procedure: Left shoulder arthroscopic cuff repair (supraspinatus and subscapularis) with Regeneten Patch application;  Surgeon: Leim Fabry, MD;  Location: [facility];  Service: Orthopedics;  Laterality: Left;   Patient Active Problem List   Diagnosis Date Noted   S/p reverse total shoulder arthroplasty 09/27/2021   Lesion of bone of lumbosacral spine (L5) 05/12/2021   Localized osteoarthritis of shoulder regions, bilateral 03/29/2021   Chronic pain of both shoulders 03/29/2021   Drug-induced myopathy 02/21/2021   Trigger finger of right hand 11/15/2020   Prostate cancer ([place]) 07/26/2020   Insomnia 08/01/2019   Primary osteoarthritis of both wrists 02/17/2019   Trigger middle finger of left hand 02/17/2019   Chronic pain syndrome 02/17/2019   Chronic radicular lumbar pain 10/08/2018   Lumbar radiculopathy 10/08/2018   Lumbar degenerative disc disease 10/08/2018   Lumbar facet arthropathy 10/08/2018   Lumbar facet joint syndrome 10/08/2018   Intervertebral disc disorder with radiculopathy of lumbosacral region    Osteoarthritis of knee 11/30/2017   Osteoarthritis of wrist 11/30/2017   Pernicious anemia 05/22/2016   Persistent atrial fibrillation ([place]) 12/20/2015   Carpal tunnel syndrome 11/16/2014   DJD (degenerative joint disease) of knee 10/27/2014   Bilateral carpal tunnel syndrome 10/27/2014   Morbid obesity with BMI of 50.0-59.9, adult ([place]) 10/27/2014   Mixed hyperlipidemia 10/27/2014   H/O gastric bypass 03/20/2014   Hyperkalemia 03/20/2014   History of prostate cancer 12/26/2013   Essential hypertension 12/26/2013   Encounter for anticoagulation discussion and counseling 12/26/2013   PCP: Olin Hauser, DO  REFERRING PROVIDER: Leim Fabry, MD  REFERRING DIAGNOSIS: Incomplete tear of left rotator cuff, unspecified whether traumatic M75.112   THERAPY DIAG: Chronic left shoulder  pain  Chronic right shoulder pain  Muscle weakness (generalized)  RATIONALE FOR EVALUATION AND TREATMENT: Rehabilitation  ONSET DATE: Surgery 12/27/21, Chronic L shoulder pain for multiple years  FOLLOW UP APPT WITH PROVIDER: Yes    FROM INITIAL EVALUATION SUBJECTIVE:  Chief Complaint: S/p L shoulder RTC repair, biceps tenodesis, and shoulder debridement  Pertinent History Pt with history of chronic bilateral shoulder pain. He underwent right reverse TSR and right biceps tenodesis on 09/27/21. He experienced post-operative instability in R shoulder however it improved and he was able to avoid a revision. On 12/27/21 he underwent left arthroscopic rotator cuff repair (subscapularis (full-thickness), supraspinatus (partial-thickness) side-to-side repair + Regeneten patch), left arthroscopic biceps tenodesis, and left extensive debridement of shoulder (glenohumeral and subacromial spaces). Per surgical note other intraoperative findings include Type 2 SLAP tear, degenerative anterior labral tearing, grade 2 degenerative changes to articular cartilage of humeral head, and significant posterior capsule synovitis. No significant post-operative complications. He arrives without abduction pillow and states that Dr. Posey Pronto advised him he doesn't have to wear the abduction pillow. He got behind on his pain medication today and states that today his shoulder has been the most painful he has experienced since surgery. He also noticed swelling in L lateral upper arm this morning. He did take his pain medication at 14:00, before PT evaluation.  He has a history of chronic back pain and underwent a lumbar facet, medial branch radiofrequency ablation on 01/02/22. At the time of the evaluation pt has not yet appreciated improvement in  his back pain from this procedure. He has a past medical history of Cardiomyopathy (in setting of Afib), DJD (degenerative joint disease) of knee, History of kidney stones, Intervertebral disc disorder with radiculopathy of lumbosacral region, Kidney stone (2012), Lower extremity edema, PAF (paroxysmal atrial fibrillation) ([place]), Pernicious anemia, Prostate cancer ([place]), Pulmonary nodule, right, and SVT (supraventricular tachycardia) ([place]). He also  has a past surgical history that includes Knee surgery; Gastric bypass (2002); prostate seeding; Carpal tunnel release (Right, 12/23/2014); Carpal tunnel release (Left, 01/06/2015); Colonoscopy with propofol (N/A, 10/08/2017); and Cardioversion (N/A, 05/15/2019).   Pain:  Pain Intensity: Present: 5/10, Best: 3/10, Worst: 10/10 Pain location: Anterior L shoulder Pain Quality: aching and throbbing Radiating: No  Numbness/Tingling: No Focal Weakness: Yes History of prior shoulder or neck/shoulder injury, pain, surgery, or therapy: Yes, chronic bilateral shoulder pain with R TSR. 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 No AROM or WB through LUE (per protocol), Passive elevation and ER in staged ROM to begin at week 4 (elevation 90 degrees weeks 4-6 with no ER; 0-120 weeks 6-8 with ER 0-30 degrees). No pulleys or cane until after 6 weeks, recommend no driving x 6-8 weeks.   Weight Bearing Restrictions: Yes No WB through LUE for at least 6 weeks  Living  Environment Lives with: lives with their spouse Lives in: House/apartment  Patient Goals: Improve L shoulder strength and pain. Pt would like to be able to play with/lift his grandson as well as take care of his property.   OBJECTIVE:   Patient Surveys  FOTO: To be completed QuickDASH: 47.7%  Cognition Patient is oriented to person, place, and time.  Recent memory is intact.  Remote memory is intact.  Attention span and concentration are intact.  Expressive speech is intact.  Patient's fund of knowledge is within normal limits for educational  level.    Gross Musculoskeletal Assessment Tremor: None Bulk: Mild swelling noted in L lateral upper arm. Pt denies chills, fevers, redness, or dischrage from inicisions Tone: Normal  Posture Forward head and rounded shoulders  AROM Full L wrist AROM flexion, extension, radial deviation, and ulnar deviation. MCP flexion and extension WNL. Unable to assess L shoulder and elbow AROM per protocol  PROM Full L elbow PROM flexion and extension however during first attempt at slow passive extension pt reports sudden pain in L shoulder which eventually improve after a couple minutes of rest. Full L elbow PROM pronation/supination.   LE MMT: Full L wrist strength for flexion, extension, radial deviation, and ulnar deviation. L grip strength is intact and strong. Deferred testing of L elbow and shoulder strength per protocol  Sensation Grossly intact to light touch throughout LUE as determined by testing dermatomes C2-T2. Proprioception and hot/cold testing deferred on this date.  Palpation Pt is generally tender to light palpation around anterior, lateral, and posterior L shoulder;    TODAY'S TREATMENT    SUBJECTIVE: Pt reports continued bilateral shoulder pain. He had one episode where he felt the "muscles slip" in the R shoulder but denies any true subluxation/dislocation. He has not had any R shoulder pain today. He has continued  experiencing bilateral hand numbness. He is still waiting for his appointment with [facility] specialist to discuss possible hernia repair.    PAIN: Denies R shoulder pain, 5-6/10 in L shoulder;   TREATMENT   Ther-ex  All exercises performed in semi-recumbent position;  Updated outcome measures with patient: QuickDASH (L shoulder): 65.91% Worst pain in L shoulder: 10/10; FOTO: 59  L shoulder AAROM cane flexion with bent elbows to around 130 degrees x 10, second set deferred due to some increase in L shoulder pain; L elbow AROM flexion 2 x 10; L elbow manually resisted extension 2 x 10; L shoulder isometric IR, ER, flexion, and extension at neutral with forearm vertical 3s hold x 5 each direction; R shoulder flexion from neutral to 90 degrees with 3# dumbbell 2 x 10; R shoulder serratus punch with 3# dumbbell 2 x 10; R shoulder circles with 2# dumbbell CW/CCW 2 x 10 each direction; R elbow flexion with 3# dumbbell 2 x 10; R elbow extension with manual resistance 2 x 10; R shoulder internal rotation from forearm vertical to stomach with manual resistance from therapist 2 x 10; R shoulder external rotation from stomach to forearm vertical with manual resistance from therapist 2 x 10;   PATIENT EDUCATION:  Education details:  Pt educated throughout session about proper posture and technique with exercises. Improved exercise technique, movement at target joints, use of target muscles after min to mod verbal, visual, tactile cues. Outcome measures/goals; Person educated: Patient Education method: Explanation Education comprehension: verbalized understanding and returned demonstration   HOME EXERCISE PROGRAM: Access Code: [email or web address] URL: [email or web address] Date: 01/10/2022 Prepared by: Roxana Hires  Exercises - Wrist Flexion AROM  - 2 x daily - 7 x weekly - 2 sets - 10 reps - 3s hold - Wrist Extension AROM  - 2 x daily - 7 x weekly - 2 sets - 10 reps - 3s hold -  Seated Scapular Retraction  - 2 x daily - 7 x weekly - 2 sets - 10 reps - 3s hold - Supported Elbow Flexion Extension PROM  - 2 x daily - 7 x weekly - 2 sets - 10 reps - 3s hold - Forearm Supination PROM (Mirrored)  -  2 x daily - 7 x weekly - 2 sets - 10 reps - 3s hold - Forearm Pronation PROM (Mirrored)  - 2 x daily - 7 x weekly - 2 sets - 10 reps - 3s hold - Seated Gripping Towel  - 2 x daily - 7 x weekly - 2 sets - 10 reps - 3s hold   ASSESSMENT:  CLINICAL IMPRESSION: Pt continues making progress with both shoulders today. Updated outcome measures/goals for L shoulder. His FOTO improved from 4 at initial evaluation to 71 today. His worst pain is unchanged and his QuickDASH is slightly worse. He is able to perform AROM of L shoulder in all directions against gravity demonstrating at least 3/5 strength. Unable to perform MMT due to protocol restrictions. Pt is able to increase weight in RUE during strengthening today. Continued AAROM for L shoulder today and pt is able to progress into slightly more flexion without pain. Pt encouraged to continue HEP and follow-up as scheduled. Plan to progress LUE per protocol as pt is able to tolerate in addition to progressing resistance with RUE strengthening against gravity. He will benefit from skilled PT to address deficits in range of motion and strength in order to improve overall function at home and with leisure activities such as caring for his property.   REHAB POTENTIAL: Good  CLINICAL DECISION MAKING: Evolving/moderate complexity  EVALUATION COMPLEXITY: Moderate   GOALS: Goals reviewed with patient? Yes  SHORT TERM GOALS: Target date: 02/14/22  Pt will be independent with HEP to improve strength and decrease right shoulder pain to improve pain-free function at home and work. Baseline:  Goal status: ONGOING   LONG TERM GOALS: Target date: 03/28/22  Pt will increase L shoulder FOTO to at least 51 to demonstrate significant improvement in  function at home and with leisure activities related to L shoulder. Baseline: 01/03/22: To be completed; 01/24/22: 4; 02/28/22: 59; Goal status: ACHIEVED  2.  Pt will decrease worst L shoulder pain by at least 3 points on the NPRS in order to demonstrate clinically significant reduction in R shoulder pain. Baseline: 01/03/22: worst: 10/10; 02/28/22: 10/10; Goal status: ONGOING  3.  Pt will decrease quick DASH score by at least 8% in order to demonstrate clinically significant reduction in disability related to L shoulder pain        Baseline: 01/03/22: 47.7%; 02/28/22: 65.91% Goal status: ONGOING  4. Pt will demonstrate L shoulder flexion, abduction, IR, and ER strength of at least 4/5 in order to demonstrate improvement in strength and function       Baseline: 01/03/22: Unable to test per protocol; 02/28/22: At least 3/5 but unable to fully test due to protocol; Goal status: ONGOING   PLAN: PT FREQUENCY: 1-2x/week  PT DURATION: 12 weeks  PLANNED INTERVENTIONS: Therapeutic exercises, Therapeutic activity, Neuromuscular re-education, Balance training, Gait training, Patient/Family education, Joint manipulation, Joint mobilization, Vestibular training, Canalith repositioning, Aquatic Therapy, Dry Needling, Electrical stimulation, Spinal manipulation, Spinal mobilization, Cryotherapy, Moist heat, Traction, Ultrasound, Ionotophoresis 28m/ml Dexamethasone, and Manual therapy  PLAN FOR NEXT SESSION: Progress L shoulder ROM and strength per protocol, continue R shoulder strengthening  JLyndel SafeHuprich PT, DPT, GCS  03/01/2022, 1:15 PM

## 2022-03-02 ENCOUNTER — Other Ambulatory Visit: Payer: Self-pay | Admitting: Student in an Organized Health Care Education/Training Program

## 2022-03-02 ENCOUNTER — Ambulatory Visit: Payer: Medicare Other

## 2022-03-02 ENCOUNTER — Telehealth: Payer: Self-pay | Admitting: Student in an Organized Health Care Education/Training Program

## 2022-03-02 DIAGNOSIS — M19031 Primary osteoarthritis, right wrist: Secondary | ICD-10-CM

## 2022-03-02 NOTE — Telephone Encounter (Signed)
PT wants to see about getting injection done in wrist. PT stated that it's been done before.

## 2022-03-02 NOTE — Telephone Encounter (Signed)
I have sent a note to Dr. Holley Raring.

## 2022-03-07 ENCOUNTER — Ambulatory Visit: Payer: Medicare Other

## 2022-03-07 DIAGNOSIS — G8929 Other chronic pain: Secondary | ICD-10-CM | POA: Diagnosis not present

## 2022-03-07 DIAGNOSIS — M25512 Pain in left shoulder: Secondary | ICD-10-CM | POA: Diagnosis not present

## 2022-03-07 DIAGNOSIS — M6281 Muscle weakness (generalized): Secondary | ICD-10-CM

## 2022-03-07 DIAGNOSIS — M25511 Pain in right shoulder: Secondary | ICD-10-CM | POA: Diagnosis not present

## 2022-03-07 NOTE — Therapy (Unsigned)
OUTPATIENT PHYSICAL THERAPY SHOULDER TREATMENT  Patient Name: [name] MRN: [id number] DOB:27-Oct-1955, 67 y.o., male Today's Date: 03/08/2022   PT End of Session - 03/07/22 1416     Visit Number 11    Number of Visits 25    Date for PT Re-Evaluation 03/28/22    Authorization Type [employer] Medicare    Authorization Time Period 01/03/22-03/28/22    PT Start Time 1407    PT Stop Time 1445    PT Time Calculation (min) 38 min    Activity Tolerance Patient tolerated treatment well    Behavior During Therapy WFL for tasks assessed/performed            Past Medical History:  Diagnosis Date   Anxiety    Aortic atherosclerosis ([place])    Cardiomyopathy (in setting of Afib)    a.) TTE 12/26/2013: EF 45-50%, mild ant and antsept HK. mild MR. Mod dil LA. nl RV fxn. Rhythm was Afib; b.) TTE 07/04/2019: EF 55%, mid-apical anteroseptal HK, mild MAC, mild Ao sclerosis, G2DD, RVSP 45.3   Chronic pain syndrome    a.) followed by pain management   Chronic, continuous use of opioids    a.) hydrocodone/APAP 7.5/325 mg; followed by pain management   Coronary artery disease 11/06/2014   a.) cCTA 11/06/2014: Ca score 224 (all in pLAD) -- 74th percentile for age/sex matched control   Diverticulosis    DJD (degenerative joint disease) of knee    History of hiatal hernia    History of kidney stones 2012   Hyperlipidemia    Hyperplastic colon polyp    Hypertension    Internal hemorrhoids    Intervertebral disc disorder with radiculopathy of lumbosacral region    Long term current use FULL DOSE (325 mg) aspirin    Long term current use of amiodarone    Long term current use of antithrombotics/antiplatelets    a.) rivaroxaban   Lower extremity edema    Mixed hyperlipidemia    Myalgia due to statin    Osteoarthritis    PAF (paroxysmal atrial fibrillation) ([place])    a.) CHA2DS2VASc = 4 (age, HTN, CHF, vascular disease history);  b.) s/p DCCV 03/13/2014 (200 J x1); c.) s/p DCCV 05/15/2019 (150 J x 1,  200 J x2); d.) rate/rhythm maintained on oral amiodarone + metoprolol succinate; chronically anticoagulated with rivaroxaban   Pernicious anemia    Prostate cancer ([place])    Pulmonary nodule, right    a. 10/2014 Cardiac CTA: 80m RLL nodule; b. 04/2015 CT Chest: stable 735mRLL nodule. No new nodules; 02/2017 CTA Chest: stable, benign, 59m84mLL pulm nodule.   Sleep difficulties    a.) takes melatonin + trazodone PRN   SVT (supraventricular tachycardia)    Tubular adenoma of colon    Past Surgical History:  Procedure Laterality Date   BICEPT TENODESIS Right 09/27/2021   Procedure: Right reverse shoulder arthroplasty, biceps tenodesis;  Surgeon: PatLeim FabryD;  Location: [facility];  Service: Orthopedics;  Laterality: Right;   CARDIOVERSION N/A 05/15/2019   Procedure: CARDIOVERSION;  Surgeon: GolMinna MerrittsD;  Location: [facility];  Service: Cardiovascular;  Laterality: N/A;   CARDIOVERSION N/A 03/13/2014   Procedure: CARDIOVERSION; [place] [facility] Tim[place] Rog[place]   CARPAL TUNNEL RELEASE Right 12/23/2014   Procedure: CARPAL TUNNEL RELEASE;  Surgeon: HowEarnestine LeysD;  Location: [facility];  Service: Orthopedics;  Laterality: Right;   CARPAL TUNNEL RELEASE Left 01/06/2015   Procedure: CARPAL TUNNEL RELEASE;  Surgeon: HowEarnestine LeysD;  Location: [facility];  Service: Orthopedics;  Laterality: Left;   COLONOSCOPY WITH PROPOFOL N/A 10/08/2017   Procedure: COLONOSCOPY WITH PROPOFOL;  Surgeon: Lin Landsman, MD;  Location: [facility];  Service: Gastroenterology;  Laterality: N/A;   GASTRIC BYPASS  01/17/2000   KNEE SURGERY Right    knee trauma x3   prostate seeding     REVERSE SHOULDER ARTHROPLASTY Right 09/27/2021   Procedure: Right reverse shoulder arthroplasty, biceps tenodesis;  Surgeon: Leim Fabry, MD;  Location: [facility];  Service: Orthopedics;  Laterality: Right;   SHOULDER ARTHROSCOPY WITH ROTATOR CUFF REPAIR AND OPEN BICEPS TENODESIS Left 12/27/2021   Procedure:  Left shoulder arthroscopic cuff repair (supraspinatus and subscapularis) with Regeneten Patch application;  Surgeon: Leim Fabry, MD;  Location: [facility];  Service: Orthopedics;  Laterality: Left;   Patient Active Problem List   Diagnosis Date Noted   S/p reverse total shoulder arthroplasty 09/27/2021   Lesion of bone of lumbosacral spine (L5) 05/12/2021   Localized osteoarthritis of shoulder regions, bilateral 03/29/2021   Chronic pain of both shoulders 03/29/2021   Drug-induced myopathy 02/21/2021   Trigger finger of right hand 11/15/2020   Prostate cancer ([place]) 07/26/2020   Insomnia 08/01/2019   Primary osteoarthritis of both wrists 02/17/2019   Trigger middle finger of left hand 02/17/2019   Chronic pain syndrome 02/17/2019   Chronic radicular lumbar pain 10/08/2018   Lumbar radiculopathy 10/08/2018   Lumbar degenerative disc disease 10/08/2018   Lumbar facet arthropathy 10/08/2018   Lumbar facet joint syndrome 10/08/2018   Intervertebral disc disorder with radiculopathy of lumbosacral region    Osteoarthritis of knee 11/30/2017   Osteoarthritis of wrist 11/30/2017   Pernicious anemia 05/22/2016   Persistent atrial fibrillation ([place]) 12/20/2015   Carpal tunnel syndrome 11/16/2014   DJD (degenerative joint disease) of knee 10/27/2014   Bilateral carpal tunnel syndrome 10/27/2014   Morbid obesity with BMI of 50.0-59.9, adult ([place]) 10/27/2014   Mixed hyperlipidemia 10/27/2014   H/O gastric bypass 03/20/2014   Hyperkalemia 03/20/2014   History of prostate cancer 12/26/2013   Essential hypertension 12/26/2013   Encounter for anticoagulation discussion and counseling 12/26/2013   PCP: Olin Hauser, DO  REFERRING PROVIDER: Leim Fabry, MD  REFERRING DIAGNOSIS: Incomplete tear of left rotator cuff, unspecified whether traumatic M75.112   THERAPY DIAG: Chronic left shoulder pain  Chronic right shoulder pain  Muscle weakness (generalized)  RATIONALE FOR  EVALUATION AND TREATMENT: Rehabilitation  ONSET DATE: Surgery 12/27/21, Chronic L shoulder pain for multiple years  FOLLOW UP APPT WITH PROVIDER: Yes    FROM INITIAL EVALUATION SUBJECTIVE:  Chief Complaint: S/p L shoulder RTC repair, biceps tenodesis, and shoulder debridement  Pertinent History Pt with history of chronic bilateral shoulder pain. He underwent right reverse TSR and right biceps tenodesis on 09/27/21. He experienced post-operative instability in R shoulder however it improved and he was able to avoid a revision. On 12/27/21 he underwent left arthroscopic rotator cuff repair (subscapularis (full-thickness), supraspinatus (partial-thickness) side-to-side repair + Regeneten patch), left arthroscopic biceps tenodesis, and left extensive debridement of shoulder (glenohumeral and subacromial spaces). Per surgical note other intraoperative findings include Type 2 SLAP tear, degenerative anterior labral tearing, grade 2 degenerative changes to articular cartilage of humeral head, and significant posterior capsule synovitis. No significant post-operative complications. He arrives without abduction pillow and states that Dr. Posey Pronto advised him he doesn't have to wear the abduction pillow. He got behind on his pain medication today and states that today his shoulder has been the most painful he has experienced since surgery. He also noticed swelling in L lateral upper arm this morning. He did take his pain medication at 14:00, before PT evaluation.  He has a history of chronic back pain and underwent a lumbar facet, medial branch radiofrequency ablation on 01/02/22. At the time of the evaluation pt has not yet appreciated improvement in his back pain from this procedure. He has a past medical history of Cardiomyopathy (in  setting of Afib), DJD (degenerative joint disease) of knee, History of kidney stones, Intervertebral disc disorder with radiculopathy of lumbosacral region, Kidney stone (2012), Lower extremity edema, PAF (paroxysmal atrial fibrillation) ([place]), Pernicious anemia, Prostate cancer ([place]), Pulmonary nodule, right, and SVT (supraventricular tachycardia) ([place]). He also  has a past surgical history that includes Knee surgery; Gastric bypass (2002); prostate seeding; Carpal tunnel release (Right, 12/23/2014); Carpal tunnel release (Left, 01/06/2015); Colonoscopy with propofol (N/A, 10/08/2017); and Cardioversion (N/A, 05/15/2019).   Pain:  Pain Intensity: Present: 5/10, Best: 3/10, Worst: 10/10 Pain location: Anterior L shoulder Pain Quality: aching and throbbing Radiating: No  Numbness/Tingling: No Focal Weakness: Yes History of prior shoulder or neck/shoulder injury, pain, surgery, or therapy: Yes, chronic bilateral shoulder pain with R TSR. 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 No AROM or WB through LUE (per protocol), Passive elevation and ER in staged ROM to begin at week 4 (elevation 90 degrees weeks 4-6 with no ER; 0-120 weeks 6-8 with ER 0-30 degrees). No pulleys or cane until after 6 weeks, recommend no driving x 6-8 weeks.   Weight Bearing Restrictions: Yes No WB through LUE for at least 6 weeks  Living Environment Lives with: lives with their spouse Lives in: House/apartment  Patient Goals:  Improve L shoulder strength and pain. Pt would like to be able to play with/lift his grandson as well as take care of his property.   OBJECTIVE:   Patient Surveys  FOTO: To be completed QuickDASH: 47.7%  Cognition Patient is oriented to person, place, and time.  Recent memory is intact.  Remote memory is intact.  Attention span and concentration are intact.  Expressive speech is intact.  Patient's fund of knowledge is within normal limits for educational  level.    Gross Musculoskeletal Assessment Tremor: None Bulk: Mild swelling noted in L lateral upper arm. Pt denies chills, fevers, redness, or dischrage from inicisions Tone: Normal  Posture Forward head and rounded shoulders  AROM Full L wrist AROM flexion, extension, radial deviation, and ulnar deviation. MCP flexion and extension WNL. Unable to assess L shoulder and elbow AROM per protocol  PROM Full L elbow PROM flexion and extension however during first attempt at slow passive extension pt reports sudden pain in L shoulder which eventually improve after a couple minutes of rest. Full L elbow PROM pronation/supination.   LE MMT: Full L wrist strength for flexion, extension, radial deviation, and ulnar deviation. L grip strength is intact and strong. Deferred testing of L elbow and shoulder strength per protocol  Sensation Grossly intact to light touch throughout LUE as determined by testing dermatomes C2-T2. Proprioception and hot/cold testing deferred on this date.  Palpation Pt is generally tender to light palpation around anterior, lateral, and posterior L shoulder;    TODAY'S TREATMENT    SUBJECTIVE: Pt reports continued bilateral shoulder pain. He saw ortho regarding his LUE bruising and he was encouraged to continue gentle PT. He is still waiting for his appointment with [facility] specialist to discuss possible hernia repair.    PAIN: Denies R shoulder pain, L shoulder pain not rated;   TREATMENT   Manual  Therapy  L shoulder gentle pain-free PROM into flexion and abduction x multiple bouts;   Ther-ex  All exercises performed in semi-recumbent position; L shoulder AAROM cane flexion with straight elbows to around 130 degrees x 15; L elbow AROM flexion 2 x 10; L elbow manually resisted extension 2 x 10; L shoulder isometric IR, ER, flexion, and extension at neutral with forearm vertical 3s hold x 5 each direction; R shoulder flexion from neutral to 90 degrees with 2# dumbbell 2 x 10; R shoulder serratus punch with 2# dumbbell 2 x 10; R shoulder circles with 2# dumbbell CW/CCW 2 x 10 each direction; R elbow flexion with 2# dumbbell 2 x 10; R elbow extension with manual resistance 2 x 10; R shoulder internal rotation from forearm vertical to stomach with manual resistance from therapist 2 x 10; R shoulder external rotation from stomach to forearm vertical with manual resistance from therapist 2 x 10; Ice pack applied to bilateral shoulders at end of session during HEP review;   PATIENT EDUCATION:  Education details:  Pt educated throughout session about proper posture and technique with exercises. Improved exercise technique, movement at target joints, use of target muscles after min to mod verbal, visual, tactile cues. Person educated: Patient Education method: Explanation Education comprehension: verbalized understanding and returned demonstration   HOME EXERCISE PROGRAM: Access Code: [email or web address] URL: [email or web address] Date: 01/10/2022 Prepared by: Roxana Hires  Exercises - Wrist Flexion AROM  - 2 x daily - 7 x weekly - 2 sets - 10 reps - 3s hold - Wrist Extension AROM  - 2 x daily - 7 x weekly - 2 sets - 10 reps - 3s hold - Seated Scapular Retraction  - 2 x daily - 7 x weekly - 2 sets - 10 reps - 3s hold - Supported Elbow Flexion Extension PROM  - 2 x daily - 7 x weekly - 2 sets - 10 reps - 3s hold - Forearm Supination PROM (Mirrored)  - 2 x daily - 7 x weekly - 2  sets - 10 reps - 3s hold - Forearm Pronation PROM (Mirrored)  -  2 x daily - 7 x weekly - 2 sets - 10 reps - 3s hold - Seated Gripping Towel  - 2 x daily - 7 x weekly - 2 sets - 10 reps - 3s hold   ASSESSMENT:  CLINICAL IMPRESSION: Pt continues making progress with both shoulders today. Continued AAROM for L shoulder today. Regressed to 2# dumbbells for RUE. Pt encouraged to continue HEP and follow-up as scheduled. Plan to progress LUE per protocol as pt is able to tolerate in addition to progressing resistance with RUE strengthening against gravity. He will benefit from skilled PT to address deficits in range of motion and strength in order to improve overall function at home and with leisure activities such as caring for his property.   REHAB POTENTIAL: Good  CLINICAL DECISION MAKING: Evolving/moderate complexity  EVALUATION COMPLEXITY: Moderate   GOALS: Goals reviewed with patient? Yes  SHORT TERM GOALS: Target date: 02/14/22  Pt will be independent with HEP to improve strength and decrease right shoulder pain to improve pain-free function at home and work. Baseline:  Goal status: ONGOING   LONG TERM GOALS: Target date: 03/28/22  Pt will increase L shoulder FOTO to at least 51 to demonstrate significant improvement in function at home and with leisure activities related to L shoulder. Baseline: 01/03/22: To be completed; 01/24/22: 4; 02/28/22: 59; Goal status: ACHIEVED  2.  Pt will decrease worst L shoulder pain by at least 3 points on the NPRS in order to demonstrate clinically significant reduction in R shoulder pain. Baseline: 01/03/22: worst: 10/10; 02/28/22: 10/10; Goal status: ONGOING  3.  Pt will decrease quick DASH score by at least 8% in order to demonstrate clinically significant reduction in disability related to L shoulder pain        Baseline: 01/03/22: 47.7%; 02/28/22: 65.91% Goal status: ONGOING  4. Pt will demonstrate L shoulder flexion, abduction, IR, and ER  strength of at least 4/5 in order to demonstrate improvement in strength and function       Baseline: 01/03/22: Unable to test per protocol; 02/28/22: At least 3/5 but unable to fully test due to protocol; Goal status: ONGOING   PLAN: PT FREQUENCY: 1-2x/week  PT DURATION: 12 weeks  PLANNED INTERVENTIONS: Therapeutic exercises, Therapeutic activity, Neuromuscular re-education, Balance training, Gait training, Patient/Family education, Joint manipulation, Joint mobilization, Vestibular training, Canalith repositioning, Aquatic Therapy, Dry Needling, Electrical stimulation, Spinal manipulation, Spinal mobilization, Cryotherapy, Moist heat, Traction, Ultrasound, Ionotophoresis 55m/ml Dexamethasone, and Manual therapy  PLAN FOR NEXT SESSION: Progress L shoulder ROM and strength per protocol, continue R shoulder strengthening  JLyndel SafeHuprich PT, DPT, GCS  03/08/2022, 10:09 PM

## 2022-03-08 ENCOUNTER — Encounter: Payer: Self-pay | Admitting: Student in an Organized Health Care Education/Training Program

## 2022-03-08 ENCOUNTER — Ambulatory Visit
Payer: Medicare Other | Attending: Student in an Organized Health Care Education/Training Program | Admitting: Student in an Organized Health Care Education/Training Program

## 2022-03-08 ENCOUNTER — Ambulatory Visit
Admission: RE | Admit: 2022-03-08 | Discharge: 2022-03-08 | Disposition: A | Discharge status: Home / Self Care | Payer: Medicare Other | Source: Ambulatory Visit | Attending: Student in an Organized Health Care Education/Training Program | Admitting: Student in an Organized Health Care Education/Training Program

## 2022-03-08 VITALS — BP 131/87 | HR 70 | Temp 97.2°F | Resp 16 | Ht 72.0 in | Wt 333.0 lb

## 2022-03-08 DIAGNOSIS — M19031 Primary osteoarthritis, right wrist: Secondary | ICD-10-CM | POA: Insufficient documentation

## 2022-03-08 DIAGNOSIS — M19032 Primary osteoarthritis, left wrist: Secondary | ICD-10-CM | POA: Diagnosis not present

## 2022-03-08 MED ORDER — ROPIVACAINE HCL 2 MG/ML IJ SOLN
2.0000 mL | Freq: Once | INTRAMUSCULAR | Status: AC
  Administered 2022-03-08: 2 mL via EPIDURAL
  Filled 2022-03-08: qty 20

## 2022-03-08 MED ORDER — DEXAMETHASONE SODIUM PHOSPHATE 10 MG/ML IJ SOLN
10.0000 mg | Freq: Once | INTRAMUSCULAR | Status: AC
  Administered 2022-03-08: 10 mg
  Filled 2022-03-08: qty 1

## 2022-03-08 NOTE — Patient Instructions (Signed)

## 2022-03-08 NOTE — Progress Notes (Signed)
PROVIDER NOTE: Information contained herein reflects review and annotations entered in association with encounter. Interpretation of such information and data should be left to medically-trained personnel. Information provided to patient can be located elsewhere in the medical record under "Patient Instructions". Document created using STT-dictation technology, any transcriptional errors that may result from process are unintentional.    Patient: [name]  Service Category: Procedure  Provider: Gillis Santa, MD  DOB: [birth year]  DOS: 03/08/2022  Location: [facility]  MRN: [id number]  Setting: Ambulatory - outpatient  Referring Provider: Nobie Putnam *  Type: Established Patient  Specialty: Interventional Pain Management  PCP: Olin Hauser, DO   Primary Reason for Visit: Interventional Pain Management Treatment. CC: Wrist Pain, Shoulder Pain (bilateral), and Back Pain  Procedure:          Anesthesia, Analgesia, Anxiolysis:  Type: Therapeutic Wrist Steroid Injection  #3  Region: Dorsal Scaphoid Level: Wrist Laterality: Bilateral  Type: Local Anesthesia Indication(s): Analgesia         Local Anesthetic: Lidocaine 1-2% Route: Infiltration ([place]/IM) IV Access: Declined Sedation: Declined   Position: Sitting   Indications: 1. Primary osteoarthritis of both wrists (Right>left)       Pain Score: Pre-procedure: 5 /10 Post-procedure:  (RIGHT 2, LEFT 0)/10   Pre-op Assessment:  [name] is a 67 y.o. (year old), male patient, seen today for interventional treatment. He  has a past surgical history that includes Knee surgery (Right); Gastric bypass (01/17/2000); prostate seeding; Carpal tunnel release (Right, 12/23/2014); Carpal tunnel release (Left, 01/06/2015); Colonoscopy with propofol (N/A, 10/08/2017); Cardioversion (N/A, 05/15/2019); Cardioversion (N/A, 03/13/2014); Reverse shoulder arthroplasty (Right, 09/27/2021); Bicept tenodesis (Right,  09/27/2021); and Shoulder arthroscopy with rotator cuff repair and open biceps tenodesis (Left, 12/27/2021). [name] has a current medication list which includes the following prescription(s): amiodarone, aspirin ec, benazepril, buspirone, carboxymethylcellulose, cyanocobalamin, cyclobenzaprine, duloxetine, [START ON 03/21/2022] hydrocodone-acetaminophen, melatonin, metoprolol succinate, multivitamin with minerals, xarelto, trazodone, and vitamin d (ergocalciferol). His primarily concern today is the Wrist Pain, Shoulder Pain (bilateral), and Back Pain  Initial Vital Signs:  Pulse/HCG Rate: 84  Temp: (!) 97.2 F (36.2 C) Resp: 20 BP: (!) 133/93 SpO2: 99 %  BMI: Estimated body mass index is 45.16 kg/m as calculated from the following:   Height as of this encounter: 6' (1.829 m).   Weight as of this encounter: 333 lb (151 kg).  Risk Assessment: Allergies: Reviewed. He is allergic to bupivacaine liposome.  Allergy Precautions: None required Coagulopathies: Reviewed. None identified.  Blood-thinner therapy: None at this time Active Infection(s): Reviewed. None identified. [name] is afebrile  Site Confirmation: [name] was asked to confirm the procedure and laterality before marking the site Procedure checklist: Completed Consent: Before the procedure and under the influence of no sedative(s), amnesic(s), or anxiolytics, the patient was informed of the treatment options, risks and possible complications. To fulfill our ethical and legal obligations, as recommended by the American Medical Association's Code of Ethics, I have informed the patient of my clinical impression; the nature and purpose of the treatment or procedure; the risks, benefits, and possible complications of the intervention; the alternatives, including doing nothing; the risk(s) and benefit(s) of the alternative treatment(s) or procedure(s); and the risk(s) and benefit(s) of doing nothing. The patient was provided  information about the general risks and possible complications associated with the procedure. These may include, but are not limited to: failure to achieve desired goals, infection, bleeding, organ or nerve damage, allergic reactions, paralysis, and death. In  addition, the patient was informed of those risks and complications associated to the procedure, such as failure to decrease pain; infection; bleeding; organ or nerve damage with subsequent damage to sensory, motor, and/or autonomic systems, resulting in permanent pain, numbness, and/or weakness of one or several areas of the body; allergic reactions; (i.e.: anaphylactic reaction); and/or death. Furthermore, the patient was informed of those risks and complications associated with the medications. These include, but are not limited to: allergic reactions (i.e.: anaphylactic or anaphylactoid reaction(s)); adrenal axis suppression; blood sugar elevation that in diabetics may result in ketoacidosis or comma; water retention that in patients with history of congestive heart failure may result in shortness of breath, pulmonary edema, and decompensation with resultant heart failure; weight gain; swelling or edema; medication-induced neural toxicity; particulate matter embolism and blood vessel occlusion with resultant organ, and/or nervous system infarction; and/or aseptic necrosis of one or more joints. Finally, the patient was informed that Medicine is not an exact science; therefore, there is also the possibility of unforeseen or unpredictable risks and/or possible complications that may result in a catastrophic outcome. The patient indicated having understood very clearly. We have given the patient no guarantees and we have made no promises. Enough time was given to the patient to ask questions, all of which were answered to the patient's satisfaction. [name] has indicated that he wanted to continue with the procedure. Attestation: I, the ordering  provider, attest that I have discussed with the patient the benefits, risks, side-effects, alternatives, likelihood of achieving goals, and potential problems during recovery for the procedure that I have provided informed consent. Date  Time: 03/08/2022  9:23 AM  Pre-Procedure Preparation:  Monitoring: As per clinic protocol. Respiration, ETCO2, SpO2, BP, heart rate and rhythm monitor placed and checked for adequate function Safety Precautions: Patient was assessed for positional comfort and pressure points before starting the procedure. Time-out: I initiated and conducted the "Time-out" before starting the procedure, as per protocol. The patient was asked to participate by confirming the accuracy of the "Time Out" information. Verification of the correct person, site, and procedure were performed and confirmed by me, the nursing staff, and the patient. "Time-out" conducted as per Joint Commission's Universal Protocol (UP.01.01.01). Time: 1000  Description of Procedure:          Target Area:  between scaphoid and lunate  (bilateral) Approach: Dorsal approach. Area Prepped: Entire wrist Region Prepping solution: DuraPrep (Iodine Povacrylex [0.7% available iodine] and Isopropyl Alcohol, 74% w/w) Safety Precautions: Aspiration looking for blood return was conducted prior to all injections. At no point did we inject any substances, as a needle was being advanced. No attempts were made at seeking any paresthesias. Safe injection practices and needle disposal techniques used. Medications properly checked for expiration dates. SDV (single dose vial) medications used. Description of the Procedure: Protocol guidelines were followed. The patient was placed in position. The target area was identified and the area prepped in the usual manner. Skin & deeper tissues infiltrated with local anesthetic. Appropriate amount of time allowed to pass for local anesthetics to take effect. The procedure needles were then  advanced to the target area. Proper needle placement secured. Negative aspiration confirmed. Solution injected in intermittent fashion, asking for systemic symptoms every 0.5cc of injectate. The needles were then removed and the area cleansed, making sure to leave some of the prepping solution back to take advantage of its long term bactericidal properties.  Vitals:   03/08/22 0953 03/08/22 1000 03/08/22 1005 03/08/22 1008  BP: 134/89 126/88 121/89 131/87  Pulse: 80 75 70 70  Resp: 18 16 15 16  $ Temp:      SpO2: 98% 95% 96% 97%  Weight:      Height:        Start Time: 1000 hrs. End Time: 1007 hrs. Materials:  Needle(s) Type: Regular needle Gauge: 25G Length: 1.5-in Medication(s): Please see orders for medications and dosing details. 3 cc solution made of 2 cc of 0.2% ropivacaine, 1 cc of Decadron 10 mg/cc.  1.5 cc injected in each wrist Imaging Guidance:          Type of Imaging Technique: Fluoroscopy Guidance (Non-spinal) Indication(s): Assistance in needle guidance and placement for procedures requiring needle placement in or near specific anatomical locations not easily accessible without such assistance. Exposure Time: No patient exposure Contrast: None used. Fluoroscopic Guidance: I was personally present during the use of fluoroscopy. "Tunnel Vision Technique" used to obtain the best possible view of the target area. Parallax error corrected before commencing the procedure. "Direction-depth-direction" technique used to introduce the needle under continuous pulsed fluoroscopy. Once target was reached, antero-posterior, oblique, and lateral fluoroscopic projection used confirm needle placement in all planes. Images permanently stored in EMR. Ultrasound Guidance: N/A Interpretation: N/A  Antibiotic Prophylaxis:   Anti-infectives (From admission, onward)    None      Indication(s): None identified  Post-operative Assessment:  Post-procedure Vital Signs:  Pulse/HCG Rate: 70   Temp: (!) 97.2 F (36.2 C) Resp: 16 BP: 131/87 SpO2: 97 %  EBL: None  Complications: No immediate post-treatment complications observed by team, or reported by patient.  Note: The patient tolerated the entire procedure well. A repeat set of vitals were taken after the procedure and the patient was kept under observation following institutional policy, for this type of procedure. Post-procedural neurological assessment was performed, showing return to baseline, prior to discharge. The patient was provided with post-procedure discharge instructions, including a section on how to identify potential problems. Should any problems arise concerning this procedure, the patient was given instructions to immediately contact us, at any time, without hesitation. In any case, we plan to contact the patient by telephone for a follow-up status report regarding this interventional procedure.  Comments:  No additional relevant information.  Plan of Care  Orders:  Orders Placed This Encounter  Procedures   DG PAIN [facility] C-ARM 1-60 MIN NO REPORT    Intraoperative interpretation by procedural physician at [facility].    Standing Status:   Standing    Number of Occurrences:   1    Order Specific Question:   Reason for exam:    Answer:   Assistance in needle guidance and placement for procedures requiring needle placement in or near specific anatomical locations not easily accessible without such assistance.   Medications ordered for procedure: Meds ordered this encounter  Medications   ropivacaine (PF) 2 mg/mL (0.2%) (NAROPIN) injection 2 mL   dexamethasone (DECADRON) injection 10 mg   Medications administered: We administered ropivacaine (PF) 2 mg/mL (0.2%) and dexamethasone.  See the medical record for exact dosing, route, and time of administration.  Follow-up plan:   Return in about 4 weeks (around 04/05/2022), or AND PPE, for med refill.     Recent Visits Date Type Provider Dept   02/07/22 Office Visit Gillis Santa, MD [facility]  01/30/22 Office Visit Gillis Santa, MD [facility]  01/02/22 Procedure visit Gillis Santa, MD [facility]  Showing recent visits within  past 90 days and meeting all other requirements Today's Visits Date Type Provider Dept  03/08/22 Procedure visit Gillis Santa, MD [facility]  Showing today's visits and meeting all other requirements Future Appointments Date Type Provider Dept  03/30/22 Appointment Gillis Santa, MD [facility]  Showing future appointments within next 90 days and meeting all other requirements  Disposition: Discharge home  Discharge (Date  Time): 03/08/2022; 1020 hrs.   Primary Care Physician: Olin Hauser, DO Location: [facility] Note by: Gillis Santa, MD Date: 03/08/2022; Time: 10:20 AM  Disclaimer:  Medicine is not an exact science. The only guarantee in medicine is that nothing is guaranteed. It is important to note that the decision to proceed with this intervention was based on the information collected from the patient. The Data and conclusions were drawn from the patient's questionnaire, the interview, and the physical examination. Because the information was provided in large part by the patient, it cannot be guaranteed that it has not been purposely or unconsciously manipulated. Every effort has been made to obtain as much relevant data as possible for this evaluation. It is important to note that the conclusions that lead to this procedure are derived in large part from the available data. Always take into account that the treatment will also be dependent on availability of resources and existing treatment guidelines, considered by other Pain Management Practitioners as being common knowledge and practice, at the time of the intervention. For Medico-Legal purposes, it is also important to point out that variation in  procedural techniques and pharmacological choices are the acceptable norm. The indications, contraindications, technique, and results of the above procedure should only be interpreted and judged by a Board-Certified Interventional Pain Specialist with extensive familiarity and expertise in the same exact procedure and technique.

## 2022-03-08 NOTE — Progress Notes (Signed)
Safety precautions to be maintained throughout the outpatient stay will include: orient to surroundings, keep bed in low position, maintain call bell within reach at all times, provide assistance with transfer out of bed and ambulation.  

## 2022-03-09 ENCOUNTER — Telehealth: Payer: Self-pay

## 2022-03-09 ENCOUNTER — Ambulatory Visit: Payer: Medicare Other

## 2022-03-09 DIAGNOSIS — M25511 Pain in right shoulder: Secondary | ICD-10-CM | POA: Diagnosis not present

## 2022-03-09 DIAGNOSIS — M6281 Muscle weakness (generalized): Secondary | ICD-10-CM | POA: Diagnosis not present

## 2022-03-09 DIAGNOSIS — G8929 Other chronic pain: Secondary | ICD-10-CM | POA: Diagnosis not present

## 2022-03-09 DIAGNOSIS — M25512 Pain in left shoulder: Secondary | ICD-10-CM | POA: Diagnosis not present

## 2022-03-09 NOTE — Therapy (Signed)
OUTPATIENT PHYSICAL THERAPY SHOULDER TREATMENT  Patient Name: [name] MRN: [id number] DOB:07/29/1955, 67 y.o., male Today's Date: 03/09/2022   PT End of Session - 03/09/22 1404     Visit Number 12    Number of Visits 25    Date for PT Re-Evaluation 03/28/22    Authorization Type [employer] Medicare    Authorization Time Period 01/03/22-03/28/22    PT Start Time 1404    PT Stop Time 1455    PT Time Calculation (min) 51 min    Activity Tolerance Patient tolerated treatment well    Behavior During Therapy WFL for tasks assessed/performed            Past Medical History:  Diagnosis Date   Anxiety    Aortic atherosclerosis ([place])    Cardiomyopathy (in setting of Afib)    a.) TTE 12/26/2013: EF 45-50%, mild ant and antsept HK. mild MR. Mod dil LA. nl RV fxn. Rhythm was Afib; b.) TTE 07/04/2019: EF 55%, mid-apical anteroseptal HK, mild MAC, mild Ao sclerosis, G2DD, RVSP 45.3   Chronic pain syndrome    a.) followed by pain management   Chronic, continuous use of opioids    a.) hydrocodone/APAP 7.5/325 mg; followed by pain management   Coronary artery disease 11/06/2014   a.) cCTA 11/06/2014: Ca score 224 (all in pLAD) -- 74th percentile for age/sex matched control   Diverticulosis    DJD (degenerative joint disease) of knee    History of hiatal hernia    History of kidney stones 2012   Hyperlipidemia    Hyperplastic colon polyp    Hypertension    Internal hemorrhoids    Intervertebral disc disorder with radiculopathy of lumbosacral region    Long term current use FULL DOSE (325 mg) aspirin    Long term current use of amiodarone    Long term current use of antithrombotics/antiplatelets    a.) rivaroxaban   Lower extremity edema    Mixed hyperlipidemia    Myalgia due to statin    Osteoarthritis    PAF (paroxysmal atrial fibrillation) ([place])    a.) CHA2DS2VASc = 4 (age, HTN, CHF, vascular disease history);  b.) s/p DCCV 03/13/2014 (200 J x1); c.) s/p DCCV 05/15/2019 (150 J x 1,  200 J x2); d.) rate/rhythm maintained on oral amiodarone + metoprolol succinate; chronically anticoagulated with rivaroxaban   Pernicious anemia    Prostate cancer ([place])    Pulmonary nodule, right    a. 10/2014 Cardiac CTA: 59m RLL nodule; b. 04/2015 CT Chest: stable 755mRLL nodule. No new nodules; 02/2017 CTA Chest: stable, benign, 66m58mLL pulm nodule.   Sleep difficulties    a.) takes melatonin + trazodone PRN   SVT (supraventricular tachycardia)    Tubular adenoma of colon    Past Surgical History:  Procedure Laterality Date   BICEPT TENODESIS Right 09/27/2021   Procedure: Right reverse shoulder arthroplasty, biceps tenodesis;  Surgeon: PatLeim FabryD;  Location: [facility] ORS;  Service: Orthopedics;  Laterality: Right;   CARDIOVERSION N/A 05/15/2019   Procedure: CARDIOVERSION;  Surgeon: GolMinna MerrittsD;  Location: [facility];  Service: Cardiovascular;  Laterality: N/A;   CARDIOVERSION N/A 03/13/2014   Procedure: CARDIOVERSION; [facility] TimIda RogueD   CARPAL TUNNEL RELEASE Right 12/23/2014   Procedure: CARPAL TUNNEL RELEASE;  Surgeon: HowEarnestine LeysD;  Location: [facility];  Service: Orthopedics;  Laterality: Right;   CARPAL TUNNEL RELEASE Left 01/06/2015   Procedure: CARPAL TUNNEL RELEASE;  Surgeon: HowEarnestine LeysD;  Location: [facility];  Service: Orthopedics;  Laterality: Left;   COLONOSCOPY WITH PROPOFOL N/A 10/08/2017   Procedure: COLONOSCOPY WITH PROPOFOL;  Surgeon: Lin Landsman, MD;  Location: [facility];  Service: Gastroenterology;  Laterality: N/A;   GASTRIC BYPASS  01/17/2000   KNEE SURGERY Right    knee trauma x3   prostate seeding     REVERSE SHOULDER ARTHROPLASTY Right 09/27/2021   Procedure: Right reverse shoulder arthroplasty, biceps tenodesis;  Surgeon: Leim Fabry, MD;  Location: [facility] OR[facility];  Service: Orthopedics;  Laterality: Right;   SHOULDER ARTHROSCOPY WITH ROTATOR CUFF REPAIR AND OPEN BICEPS TENODESIS Left 12/27/2021   Procedure:  Left shoulder arthroscopic cuff repair (supraspinatus and subscapularis) with Regeneten Patch application;  Surgeon: Leim Fabry, MD;  Location: [facility];  Service: Orthopedics;  Laterality: Left;   Patient Active Problem List   Diagnosis Date Noted   S/p reverse total shoulder arthroplasty 09/27/2021   Lesion of bone of lumbosacral spine (L5) 05/12/2021   Localized osteoarthritis of shoulder regions, bilateral 03/29/2021   Chronic pain of both shoulders 03/29/2021   Drug-induced myopathy 02/21/2021   Trigger finger of right hand 11/15/2020   Prostate cancer ([place]) 07/26/2020   Insomnia 08/01/2019   Primary osteoarthritis of both wrists 02/17/2019   Trigger middle finger of left hand 02/17/2019   Chronic pain syndrome 02/17/2019   Chronic radicular lumbar pain 10/08/2018   Lumbar radiculopathy 10/08/2018   Lumbar degenerative disc disease 10/08/2018   Lumbar facet arthropathy 10/08/2018   Lumbar facet joint syndrome 10/08/2018   Intervertebral disc disorder with radiculopathy of lumbosacral region    Osteoarthritis of knee 11/30/2017   Osteoarthritis of wrist 11/30/2017   Pernicious anemia 05/22/2016   Persistent atrial fibrillation ([place]) 12/20/2015   Carpal tunnel syndrome 11/16/2014   DJD (degenerative joint disease) of knee 10/27/2014   Bilateral carpal tunnel syndrome 10/27/2014   Morbid obesity with BMI of 50.0-59.9, adult ([place]) 10/27/2014   Mixed hyperlipidemia 10/27/2014   H/O gastric bypass 03/20/2014   Hyperkalemia 03/20/2014   History of prostate cancer 12/26/2013   Essential hypertension 12/26/2013   Encounter for anticoagulation discussion and counseling 12/26/2013   PCP: Olin Hauser, DO  REFERRING PROVIDER: Leim Fabry, MD  REFERRING DIAGNOSIS: Incomplete tear of left rotator cuff, unspecified whether traumatic M75.112   THERAPY DIAG: Chronic left shoulder pain  Chronic right shoulder pain  Muscle weakness (generalized)  RATIONALE FOR  EVALUATION AND TREATMENT: Rehabilitation  ONSET DATE: Surgery 12/27/21, Chronic L shoulder pain for multiple years  FOLLOW UP APPT WITH PROVIDER: Yes    FROM INITIAL EVALUATION SUBJECTIVE:  Chief Complaint: S/p L shoulder RTC repair, biceps tenodesis, and shoulder debridement  Pertinent History Pt with history of chronic bilateral shoulder pain. He underwent right reverse TSR and right biceps tenodesis on 09/27/21. He experienced post-operative instability in R shoulder however it improved and he was able to avoid a revision. On 12/27/21 he underwent left arthroscopic rotator cuff repair (subscapularis (full-thickness), supraspinatus (partial-thickness) side-to-side repair + Regeneten patch), left arthroscopic biceps tenodesis, and left extensive debridement of shoulder (glenohumeral and subacromial spaces). Per surgical note other intraoperative findings include Type 2 SLAP tear, degenerative anterior labral tearing, grade 2 degenerative changes to articular cartilage of humeral head, and significant posterior capsule synovitis. No significant post-operative complications. He arrives without abduction pillow and states that Dr. Posey Pronto advised him he doesn't have to wear the abduction pillow. He got behind on his pain medication today and states that today his shoulder has been the most painful he has experienced since surgery. He also noticed swelling in L lateral upper arm this morning. He did take his pain medication at 14:00, before PT evaluation.  He has a history of chronic back pain and underwent a lumbar facet, medial branch radiofrequency ablation on 01/02/22. At the time of the evaluation pt has not yet appreciated improvement in his back pain from this procedure. He has a past medical history of Cardiomyopathy (in  setting of Afib), DJD (degenerative joint disease) of knee, History of kidney stones, Intervertebral disc disorder with radiculopathy of lumbosacral region, Kidney stone (2012), Lower extremity edema, PAF (paroxysmal atrial fibrillation) ([place]), Pernicious anemia, Prostate cancer ([place]), Pulmonary nodule, right, and SVT (supraventricular tachycardia) ([place]). He also  has a past surgical history that includes Knee surgery; Gastric bypass (2002); prostate seeding; Carpal tunnel release (Right, 12/23/2014); Carpal tunnel release (Left, 01/06/2015); Colonoscopy with propofol (N/A, 10/08/2017); and Cardioversion (N/A, 05/15/2019).   Pain:  Pain Intensity: Present: 5/10, Best: 3/10, Worst: 10/10 Pain location: Anterior L shoulder Pain Quality: aching and throbbing Radiating: No  Numbness/Tingling: No Focal Weakness: Yes History of prior shoulder or neck/shoulder injury, pain, surgery, or therapy: Yes, chronic bilateral shoulder pain with R TSR. 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 No AROM or WB through LUE (per protocol), Passive elevation and ER in staged ROM to begin at week 4 (elevation 90 degrees weeks 4-6 with no ER; 0-120 weeks 6-8 with ER 0-30 degrees). No pulleys or cane until after 6 weeks, recommend no driving x 6-8 weeks.   Weight Bearing Restrictions: Yes No WB through LUE for at least 6 weeks  Living Environment Lives with: lives with their spouse Lives in: House/apartment  Patient Goals:  Improve L shoulder strength and pain. Pt would like to be able to play with/lift his grandson as well as take care of his property.   OBJECTIVE:   Patient Surveys  FOTO: To be completed QuickDASH: 47.7%  Cognition Patient is oriented to person, place, and time.  Recent memory is intact.  Remote memory is intact.  Attention span and concentration are intact.  Expressive speech is intact.  Patient's fund of knowledge is within normal limits for educational  level.    Gross Musculoskeletal Assessment Tremor: None Bulk: Mild swelling noted in L lateral upper arm. Pt denies chills, fevers, redness, or dischrage from inicisions Tone: Normal  Posture Forward head and rounded shoulders  AROM Full L wrist AROM flexion, extension, radial deviation, and ulnar deviation. MCP flexion and extension WNL. Unable to assess L shoulder and elbow AROM per protocol  PROM Full L elbow PROM flexion and extension however during first attempt at slow passive extension pt reports sudden pain in L shoulder which eventually improve after a couple minutes of rest. Full L elbow PROM pronation/supination.   LE MMT: Full L wrist strength for flexion, extension, radial deviation, and ulnar deviation. L grip strength is intact and strong. Deferred testing of L elbow and shoulder strength per protocol  Sensation Grossly intact to light touch throughout LUE as determined by testing dermatomes C2-T2. Proprioception and hot/cold testing deferred on this date.  Palpation Pt is generally tender to light palpation around anterior, lateral, and posterior L shoulder;    TODAY'S TREATMENT    SUBJECTIVE: Pt reports continued bruising on L shoulder; no worse overall this week.  He would like to be able to plant some seeds for his garden soon as he is an avid vegetable gardener.  He plans to scale back this year though.   PAIN: Denies R shoulder pain, L shoulder pain not rated;   TREATMENT   Manual Therapy  L  shoulder gentle pain-free PROM into flexion and abduction x multiple bouts;   Ther-ex  All exercises performed in semi-recumbent position; L shoulder AAROM cane flexion with straight elbows to around 130 degrees x 15; L elbow AROM flexion 2 x 10; L elbow manually resisted extension 2 x 10; L shoulder isometric IR, ER, flexion, and extension at neutral with forearm vertical 3s hold x 5 each direction; R shoulder flexion from neutral to 90 degrees with 2# dumbbell 2 x 10; R shoulder serratus punch with 2# dumbbell 2 x 10; R shoulder circles with 2# dumbbell CW/CCW 2 x 10 each direction; R elbow flexion with 2# dumbbell 2 x 10; R elbow extension with manual resistance 2 x 10; R shoulder internal rotation from forearm vertical to stomach with manual resistance from therapist 2 x 10; R shoulder external rotation from stomach to forearm vertical with manual resistance from therapist 2 x 10; Ice pack applied to bilateral shoulders at end of session during HEP review;   PATIENT EDUCATION:  Education details:  Pt educated throughout session about proper posture and technique with exercises. Improved exercise technique, movement at target joints, use of target muscles after min to mod verbal, visual, tactile cues. Person educated: Patient Education method: Explanation Education comprehension: verbalized understanding and returned demonstration   HOME EXERCISE PROGRAM: Access Code: [email or web address] URL: [email or web address] Date: 01/10/2022 Prepared by: Roxana Hires  Exercises - Wrist Flexion AROM  - 2 x daily - 7 x weekly - 2 sets - 10 reps - 3s hold - Wrist Extension AROM  - 2 x daily - 7 x weekly - 2 sets - 10 reps - 3s hold - Seated Scapular Retraction  - 2 x daily - 7 x weekly - 2 sets - 10 reps - 3s hold - Supported Elbow Flexion Extension PROM  - 2 x daily - 7 x weekly - 2 sets - 10 reps - 3s hold - Forearm Supination PROM (Mirrored)  - 2 x daily - 7 x weekly - 2 sets - 10  reps - 3s  hold - Forearm Pronation PROM (Mirrored)  - 2 x daily - 7 x weekly - 2 sets - 10 reps - 3s hold - Seated Gripping Towel  - 2 x daily - 7 x weekly - 2 sets - 10 reps - 3s hold   ASSESSMENT:  CLINICAL IMPRESSION: Kept rehab intensity similar to last visit.  Pt tolerated AAROM and light strengthening with isometrics well for L shoulder; only reporting some pain during elbow flexion AROM with eccentric lowering.  He will benefit from skilled PT to address deficits in range of motion and strength in order to improve overall function at home and with leisure activities such as caring for his property.   REHAB POTENTIAL: Good  CLINICAL DECISION MAKING: Evolving/moderate complexity  EVALUATION COMPLEXITY: Moderate   GOALS: Goals reviewed with patient? Yes  SHORT TERM GOALS: Target date: 02/14/22  Pt will be independent with HEP to improve strength and decrease right shoulder pain to improve pain-free function at home and work. Baseline:  Goal status: ONGOING   LONG TERM GOALS: Target date: 03/28/22  Pt will increase L shoulder FOTO to at least 51 to demonstrate significant improvement in function at home and with leisure activities related to L shoulder. Baseline: 01/03/22: To be completed; 01/24/22: 4; 02/28/22: 59; Goal status: ACHIEVED  2.  Pt will decrease worst L shoulder pain by at least 3 points on the NPRS in order to demonstrate clinically significant reduction in R shoulder pain. Baseline: 01/03/22: worst: 10/10; 02/28/22: 10/10; Goal status: ONGOING  3.  Pt will decrease quick DASH score by at least 8% in order to demonstrate clinically significant reduction in disability related to L shoulder pain        Baseline: 01/03/22: 47.7%; 02/28/22: 65.91% Goal status: ONGOING  4. Pt will demonstrate L shoulder flexion, abduction, IR, and ER strength of at least 4/5 in order to demonstrate improvement in strength and function       Baseline: 01/03/22: Unable to test per  protocol; 02/28/22: At least 3/5 but unable to fully test due to protocol; Goal status: ONGOING   PLAN: PT FREQUENCY: 1-2x/week  PT DURATION: 12 weeks  PLANNED INTERVENTIONS: Therapeutic exercises, Therapeutic activity, Neuromuscular re-education, Balance training, Gait training, Patient/Family education, Joint manipulation, Joint mobilization, Vestibular training, Canalith repositioning, Aquatic Therapy, Dry Needling, Electrical stimulation, Spinal manipulation, Spinal mobilization, Cryotherapy, Moist heat, Traction, Ultrasound, Ionotophoresis 75m/ml Dexamethasone, and Manual therapy  PLAN FOR NEXT SESSION: Progress L shoulder ROM and strength per protocol, continue R shoulder strengthening RMerdis Delay PT, DPT, OCS  #[phone] 03/09/2022, 4:00 PM

## 2022-03-09 NOTE — Telephone Encounter (Signed)
Post procedure follow up.  Patient states he is doing fine.

## 2022-03-14 ENCOUNTER — Ambulatory Visit: Payer: Medicare Other

## 2022-03-14 DIAGNOSIS — G8929 Other chronic pain: Secondary | ICD-10-CM

## 2022-03-14 DIAGNOSIS — M25511 Pain in right shoulder: Secondary | ICD-10-CM | POA: Diagnosis not present

## 2022-03-14 DIAGNOSIS — M6281 Muscle weakness (generalized): Secondary | ICD-10-CM | POA: Diagnosis not present

## 2022-03-14 DIAGNOSIS — M25512 Pain in left shoulder: Secondary | ICD-10-CM | POA: Diagnosis not present

## 2022-03-14 NOTE — Therapy (Signed)
OUTPATIENT PHYSICAL THERAPY SHOULDER TREATMENT  Patient Name: [name] MRN: [id number] DOB:December 10, 1955, 67 y.o., male Today's Date: 03/15/2022   PT End of Session - 03/14/22 1409     Visit Number 13    Number of Visits 25    Date for PT Re-Evaluation 03/28/22    Authorization Type [employer] Medicare    Authorization Time Period 01/03/22-03/28/22    PT Start Time 1406    PT Stop Time 1445    PT Time Calculation (min) 39 min    Activity Tolerance Patient tolerated treatment well    Behavior During Therapy WFL for tasks assessed/performed            Past Medical History:  Diagnosis Date   Anxiety    Aortic atherosclerosis ([place])    Cardiomyopathy (in setting of Afib)    a.) TTE 12/26/2013: EF 45-50%, mild ant and antsept HK. mild MR. Mod dil LA. nl RV fxn. Rhythm was Afib; b.) TTE 07/04/2019: EF 55%, mid-apical anteroseptal HK, mild MAC, mild Ao sclerosis, G2DD, RVSP 45.3   Chronic pain syndrome    a.) followed by pain management   Chronic, continuous use of opioids    a.) hydrocodone/APAP 7.5/325 mg; followed by pain management   Coronary artery disease 11/06/2014   a.) cCTA 11/06/2014: Ca score 224 (all in pLAD) -- 74th percentile for age/sex matched control   Diverticulosis    DJD (degenerative joint disease) of knee    History of hiatal hernia    History of kidney stones 2012   Hyperlipidemia    Hyperplastic colon polyp    Hypertension    Internal hemorrhoids    Intervertebral disc disorder with radiculopathy of lumbosacral region    Long term current use FULL DOSE (325 mg) aspirin    Long term current use of amiodarone    Long term current use of antithrombotics/antiplatelets    a.) rivaroxaban   Lower extremity edema    Mixed hyperlipidemia    Myalgia due to statin    Osteoarthritis    PAF (paroxysmal atrial fibrillation) ([place])    a.) CHA2DS2VASc = 4 (age, HTN, CHF, vascular disease history);  b.) s/p DCCV 03/13/2014 (200 J x1); c.) s/p DCCV 05/15/2019 (150 J x 1,  200 J x2); d.) rate/rhythm maintained on oral amiodarone + metoprolol succinate; chronically anticoagulated with rivaroxaban   Pernicious anemia    Prostate cancer ([place])    Pulmonary nodule, right    a. 10/2014 Cardiac CTA: 9m RLL nodule; b. 04/2015 CT Chest: stable 773mRLL nodule. No new nodules; 02/2017 CTA Chest: stable, benign, 30m80mLL pulm nodule.   Sleep difficulties    a.) takes melatonin + trazodone PRN   SVT (supraventricular tachycardia)    Tubular adenoma of colon    Past Surgical History:  Procedure Laterality Date   BICEPT TENODESIS Right 09/27/2021   Procedure: Right reverse shoulder arthroplasty, biceps tenodesis;  Surgeon: PatLeim FabryD;  Location: [facility];  Service: Orthopedics;  Laterality: Right;   CARDIOVERSION N/A 05/15/2019   Procedure: CARDIOVERSION;  Surgeon: GolMinna MerrittsD;  Location: [facility];  Service: Cardiovascular;  Laterality: N/A;   CARDIOVERSION N/A 03/13/2014   Procedure: CARDIOVERSION; [facility]: TimIda RogueD   CARPAL TUNNEL RELEASE Right 12/23/2014   Procedure: CARPAL TUNNEL RELEASE;  Surgeon: HowEarnestine LeysD;  Location: [facility];  Service: Orthopedics;  Laterality: Right;   CARPAL TUNNEL RELEASE Left 01/06/2015   Procedure: CARPAL TUNNEL RELEASE;  Surgeon: HowEarnestine LeysD;  Location: [facility];  Service: Orthopedics;  Laterality: Left;   COLONOSCOPY WITH PROPOFOL N/A 10/08/2017   Procedure: COLONOSCOPY WITH PROPOFOL;  Surgeon: Lin Landsman, MD;  Location: [facility];  Service: Gastroenterology;  Laterality: N/A;   GASTRIC BYPASS  01/17/2000   KNEE SURGERY Right    knee trauma x3   prostate seeding     REVERSE SHOULDER ARTHROPLASTY Right 09/27/2021   Procedure: Right reverse shoulder arthroplasty, biceps tenodesis;  Surgeon: Leim Fabry, MD;  Location: [facility];  Service: Orthopedics;  Laterality: Right;   SHOULDER ARTHROSCOPY WITH ROTATOR CUFF REPAIR AND OPEN BICEPS TENODESIS Left 12/27/2021   Procedure:  Left shoulder arthroscopic cuff repair (supraspinatus and subscapularis) with Regeneten Patch application;  Surgeon: Leim Fabry, MD;  Location: [facility];  Service: Orthopedics;  Laterality: Left;   Patient Active Problem List   Diagnosis Date Noted   S/p reverse total shoulder arthroplasty 09/27/2021   Lesion of bone of lumbosacral spine (L5) 05/12/2021   Localized osteoarthritis of shoulder regions, bilateral 03/29/2021   Chronic pain of both shoulders 03/29/2021   Drug-induced myopathy 02/21/2021   Trigger finger of right hand 11/15/2020   Prostate cancer ([place]) 07/26/2020   Insomnia 08/01/2019   Primary osteoarthritis of both wrists 02/17/2019   Trigger middle finger of left hand 02/17/2019   Chronic pain syndrome 02/17/2019   Chronic radicular lumbar pain 10/08/2018   Lumbar radiculopathy 10/08/2018   Lumbar degenerative disc disease 10/08/2018   Lumbar facet arthropathy 10/08/2018   Lumbar facet joint syndrome 10/08/2018   Intervertebral disc disorder with radiculopathy of lumbosacral region    Osteoarthritis of knee 11/30/2017   Osteoarthritis of wrist 11/30/2017   Pernicious anemia 05/22/2016   Persistent atrial fibrillation ([place]) 12/20/2015   Carpal tunnel syndrome 11/16/2014   DJD (degenerative joint disease) of knee 10/27/2014   Bilateral carpal tunnel syndrome 10/27/2014   Morbid obesity with BMI of 50.0-59.9, adult ([place]) 10/27/2014   Mixed hyperlipidemia 10/27/2014   H/O gastric bypass 03/20/2014   Hyperkalemia 03/20/2014   History of prostate cancer 12/26/2013   Essential hypertension 12/26/2013   Encounter for anticoagulation discussion and counseling 12/26/2013   PCP: Olin Hauser, DO  REFERRING PROVIDER: Leim Fabry, MD  REFERRING DIAGNOSIS: Incomplete tear of left rotator cuff, unspecified whether traumatic M75.112   THERAPY DIAG: Chronic left shoulder pain  Chronic right shoulder pain  Muscle weakness (generalized)  RATIONALE FOR  EVALUATION AND TREATMENT: Rehabilitation  ONSET DATE: Surgery 12/27/21, Chronic L shoulder pain for multiple years  FOLLOW UP APPT WITH PROVIDER: Yes    FROM INITIAL EVALUATION SUBJECTIVE:  Chief Complaint: S/p L shoulder RTC repair, biceps tenodesis, and shoulder debridement  Pertinent History Pt with history of chronic bilateral shoulder pain. He underwent right reverse TSR and right biceps tenodesis on 09/27/21. He experienced post-operative instability in R shoulder however it improved and he was able to avoid a revision. On 12/27/21 he underwent left arthroscopic rotator cuff repair (subscapularis (full-thickness), supraspinatus (partial-thickness) side-to-side repair + Regeneten patch), left arthroscopic biceps tenodesis, and left extensive debridement of shoulder (glenohumeral and subacromial spaces). Per surgical note other intraoperative findings include Type 2 SLAP tear, degenerative anterior labral tearing, grade 2 degenerative changes to articular cartilage of humeral head, and significant posterior capsule synovitis. No significant post-operative complications. He arrives without abduction pillow and states that Dr. Posey Pronto advised him he doesn't have to wear the abduction pillow. He got behind on his pain medication today and states that today his shoulder has been the most painful he has experienced since surgery. He also noticed swelling in L lateral upper arm this morning. He did take his pain medication at 14:00, before PT evaluation.  He has a history of chronic back pain and underwent a lumbar facet, medial branch radiofrequency ablation on 01/02/22. At the time of the evaluation pt has not yet appreciated improvement in his back pain from this procedure. He has a past medical history of Cardiomyopathy (in  setting of Afib), DJD (degenerative joint disease) of knee, History of kidney stones, Intervertebral disc disorder with radiculopathy of lumbosacral region, Kidney stone (2012), Lower extremity edema, PAF (paroxysmal atrial fibrillation) ([place]), Pernicious anemia, Prostate cancer ([place]), Pulmonary nodule, right, and SVT (supraventricular tachycardia) ([place]). He also  has a past surgical history that includes Knee surgery; Gastric bypass (2002); prostate seeding; Carpal tunnel release (Right, 12/23/2014); Carpal tunnel release (Left, 01/06/2015); Colonoscopy with propofol (N/A, 10/08/2017); and Cardioversion (N/A, 05/15/2019).   Pain:  Pain Intensity: Present: 5/10, Best: 3/10, Worst: 10/10 Pain location: Anterior L shoulder Pain Quality: aching and throbbing Radiating: No  Numbness/Tingling: No Focal Weakness: Yes History of prior shoulder or neck/shoulder injury, pain, surgery, or therapy: Yes, chronic bilateral shoulder pain with R TSR. 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 No AROM or WB through LUE (per protocol), Passive elevation and ER in staged ROM to begin at week 4 (elevation 90 degrees weeks 4-6 with no ER; 0-120 weeks 6-8 with ER 0-30 degrees). No pulleys or cane until after 6 weeks, recommend no driving x 6-8 weeks.   Weight Bearing Restrictions: Yes No WB through LUE for at least 6 weeks  Living Environment Lives with: lives with their spouse Lives in: House/apartment  Patient Goals:  Improve L shoulder strength and pain. Pt would like to be able to play with/lift his grandson as well as take care of his property.   OBJECTIVE:   Patient Surveys  FOTO: To be completed QuickDASH: 47.7%  Cognition Patient is oriented to person, place, and time.  Recent memory is intact.  Remote memory is intact.  Attention span and concentration are intact.  Expressive speech is intact.  Patient's fund of knowledge is within normal limits for educational  level.    Gross Musculoskeletal Assessment Tremor: None Bulk: Mild swelling noted in L lateral upper arm. Pt denies chills, fevers, redness, or dischrage from inicisions Tone: Normal  Posture Forward head and rounded shoulders  AROM Full L wrist AROM flexion, extension, radial deviation, and ulnar deviation. MCP flexion and extension WNL. Unable to assess L shoulder and elbow AROM per protocol  PROM Full L elbow PROM flexion and extension however during first attempt at slow passive extension pt reports sudden pain in L shoulder which eventually improve after a couple minutes of rest. Full L elbow PROM pronation/supination.   LE MMT: Full L wrist strength for flexion, extension, radial deviation, and ulnar deviation. L grip strength is intact and strong. Deferred testing of L elbow and shoulder strength per protocol  Sensation Grossly intact to light touch throughout LUE as determined by testing dermatomes C2-T2. Proprioception and hot/cold testing deferred on this date.  Palpation Pt is generally tender to light palpation around anterior, lateral, and posterior L shoulder;    TODAY'S TREATMENT    SUBJECTIVE: Pt reports continued L shoulder pain and he still has a bruise. He had severe R shoulder pain overnight which woke him up from sleep early this morning. However it has since resolved.    PAIN: Denies resting R shoulder pain, L shoulder pain not rated;   TREATMENT   Ther-ex  All exercises performed in  upright positions L shoulder AAROM flexion with straight elbows to around 130 degrees x 15; L elbow AROM flexion 2 x 10; L elbow manually resisted extension 2 x 10; L shoulder isometric IR, ER, flexion, and extension at neutral with forearm vertical 3s hold x 10 each direction; R shoulder flexion from neutral to 90 degrees with 2# dumbbell 5 x 15; R elbow flexion with 2# dumbbell 2 x 10; R elbow extension with manual resistance 2 x 10; R shoulder internal rotation from forearm vertical to stomach with manual resistance from therapist 2 x 10; R shoulder external rotation from stomach to forearm vertical with manual resistance from therapist 2 x 10; Ice pack applied to bilateral shoulders at end of session during review;   PATIENT EDUCATION:  Education details:  Pt educated throughout session about proper posture and technique with exercises. Improved exercise technique, movement at target joints, use of target muscles after min to mod verbal, visual, tactile cues. Person educated: Patient Education method: Explanation Education comprehension: verbalized understanding and returned demonstration   HOME EXERCISE PROGRAM: Access Code: [email or web address] URL: [email or web address] Date: 01/10/2022 Prepared by: Roxana Hires  Exercises - Wrist Flexion AROM  - 2 x daily - 7 x weekly - 2 sets - 10 reps - 3s hold - Wrist Extension AROM  - 2 x daily - 7 x weekly - 2 sets - 10 reps - 3s hold - Seated Scapular Retraction  - 2 x daily - 7 x weekly - 2 sets - 10 reps - 3s hold - Supported Elbow Flexion Extension PROM  - 2 x daily - 7 x weekly - 2 sets - 10 reps - 3s hold - Forearm Supination PROM (Mirrored)  - 2 x daily - 7 x weekly - 2 sets - 10 reps - 3s hold - Forearm Pronation PROM (Mirrored)  - 2 x daily - 7 x weekly - 2 sets - 10 reps - 3s hold - Seated Gripping Towel  - 2 x daily - 7 x weekly - 2 sets - 10 reps - 3s hold   ASSESSMENT:  CLINICAL IMPRESSION: Rehab intensity remained  at similar intensity to last visit.  Pt denies any significant increase in pain with AAROM and light strengthening for L shoulder per protocol. Progressed to more upright positioning today for both right and left shoulder. Pt encouraged to continue with slow progression of HEP, never pushing through pain or resistance. He will benefit from skilled PT to address deficits in range of motion and strength in order to improve overall function at home and with leisure activities such as caring for his property.   REHAB POTENTIAL: Good  CLINICAL DECISION MAKING: Evolving/moderate complexity  EVALUATION COMPLEXITY: Moderate   GOALS: Goals reviewed with patient? Yes  SHORT TERM GOALS: Target date: 02/14/22  Pt will be independent with HEP to improve strength and decrease right shoulder pain to improve pain-free function at home and work. Baseline:  Goal status: ONGOING   LONG TERM GOALS: Target date: 03/28/22  Pt will increase L shoulder FOTO to at least 51 to demonstrate significant improvement in function at home and with leisure activities related to L shoulder. Baseline: 01/03/22: To be completed; 01/24/22: 4; 02/28/22: 59; Goal status: ACHIEVED  2.  Pt will decrease worst L shoulder pain by at least 3 points on the NPRS in order to demonstrate clinically significant reduction in R shoulder pain. Baseline: 01/03/22: worst: 10/10; 02/28/22: 10/10; Goal status: ONGOING  3.  Pt will decrease quick DASH score by at least 8% in order to demonstrate clinically significant reduction in disability related to L shoulder pain        Baseline: 01/03/22: 47.7%; 02/28/22: 65.91% Goal status: ONGOING  4. Pt will demonstrate L shoulder flexion, abduction, IR, and ER strength of at least 4/5 in order to demonstrate improvement in strength and function       Baseline: 01/03/22: Unable to test per protocol; 02/28/22: At least 3/5 but unable to fully test due to protocol; Goal status: ONGOING  PLAN: PT  FREQUENCY: 1-2x/week  PT DURATION: 12 weeks  PLANNED INTERVENTIONS: Therapeutic exercises, Therapeutic activity, Neuromuscular re-education, Balance training, Gait training, Patient/Family education, Joint manipulation, Joint mobilization, Vestibular training, Canalith repositioning, Aquatic Therapy, Dry Needling, Electrical stimulation, Spinal manipulation, Spinal mobilization, Cryotherapy, Moist heat, Traction, Ultrasound, Ionotophoresis '4mg'$ /ml Dexamethasone, and Manual therapy  PLAN FOR NEXT SESSION: Progress L shoulder ROM and strength per protocol, continue R shoulder strengthening  Lyndel Safe Yoceline Bazar PT, DPT, GCS  03/15/2022, 12:19 PM

## 2022-03-16 ENCOUNTER — Ambulatory Visit: Payer: Medicare Other

## 2022-03-16 DIAGNOSIS — G8929 Other chronic pain: Secondary | ICD-10-CM | POA: Diagnosis not present

## 2022-03-16 DIAGNOSIS — M6281 Muscle weakness (generalized): Secondary | ICD-10-CM | POA: Diagnosis not present

## 2022-03-16 DIAGNOSIS — M25512 Pain in left shoulder: Secondary | ICD-10-CM | POA: Diagnosis not present

## 2022-03-16 DIAGNOSIS — M25511 Pain in right shoulder: Secondary | ICD-10-CM | POA: Diagnosis not present

## 2022-03-16 NOTE — Therapy (Signed)
OUTPATIENT PHYSICAL THERAPY SHOULDER TREATMENT  Patient Name: [name] MRN: [id number] DOB:[birth year], 67 y.o., male Today's Date: 03/16/2022   PT End of Session - 03/16/22 1434     Visit Number 14    Number of Visits 25    Date for PT Re-Evaluation 03/28/22    Authorization Type [employer] Medicare    Authorization Time Period 01/03/22-03/28/22    PT Start Time 1405    PT Stop Time 1455    PT Time Calculation (min) 50 min    Activity Tolerance Patient tolerated treatment well    Behavior During Therapy WFL for tasks assessed/performed            Past Medical History:  Diagnosis Date   Anxiety    Aortic atherosclerosis ([place])    Cardiomyopathy (in setting of Afib)    a.) TTE 12/26/2013: EF 45-50%, mild ant and antsept HK. mild MR. Mod dil LA. nl RV fxn. Rhythm was Afib; b.) TTE 07/04/2019: EF 55%, mid-apical anteroseptal HK, mild MAC, mild Ao sclerosis, G2DD, RVSP 45.3   Chronic pain syndrome    a.) followed by pain management   Chronic, continuous use of opioids    a.) hydrocodone/APAP 7.5/325 mg; followed by pain management   Coronary artery disease 11/06/2014   a.) cCTA 11/06/2014: Ca score 224 (all in pLAD) -- 74th percentile for age/sex matched control   Diverticulosis    DJD (degenerative joint disease) of knee    History of hiatal hernia    History of kidney stones 2012   Hyperlipidemia    Hyperplastic colon polyp    Hypertension    Internal hemorrhoids    Intervertebral disc disorder with radiculopathy of lumbosacral region    Long term current use FULL DOSE (325 mg) aspirin    Long term current use of amiodarone    Long term current use of antithrombotics/antiplatelets    a.) rivaroxaban   Lower extremity edema    Mixed hyperlipidemia    Myalgia due to statin    Osteoarthritis    PAF (paroxysmal atrial fibrillation) ([place])    a.) CHA2DS2VASc = 4 (age, HTN, CHF, vascular disease history);  b.) s/p DCCV 03/13/2014 (200 J x1); c.) s/p DCCV 05/15/2019 (150 J x 1,  200 J x2); d.) rate/rhythm maintained on oral amiodarone + metoprolol succinate; chronically anticoagulated with rivaroxaban   Pernicious anemia    Prostate cancer ([place])    Pulmonary nodule, right    a. 10/2014 Cardiac CTA: 85m RLL nodule; b. 04/2015 CT Chest: stable 743mRLL nodule. No new nodules; 02/2017 CTA Chest: stable, benign, 87m59mLL pulm nodule.   Sleep difficulties    a.) takes melatonin + trazodone PRN   SVT (supraventricular tachycardia)    Tubular adenoma of colon    Past Surgical History:  Procedure Laterality Date   BICEPT TENODESIS Right 09/27/2021   Procedure: Right reverse shoulder arthroplasty, biceps tenodesis;  Surgeon: PatLeim FabryD;  Location: [facility];  Service: Orthopedics;  Laterality: Right;   CARDIOVERSION N/A 05/15/2019   Procedure: CARDIOVERSION;  Surgeon: GolMinna MerrittsD;  Location: [facility];  Service: Cardiovascular;  Laterality: N/A;   CARDIOVERSION N/A 03/13/2014   Procedure: CARDIOVERSION; [facility]: TimIda RogueD   CARPAL TUNNEL RELEASE Right 12/23/2014   Procedure: CARPAL TUNNEL RELEASE;  Surgeon: HowEarnestine LeysD;  Location: [facility];  Service: Orthopedics;  Laterality: Right;   CARPAL TUNNEL RELEASE Left 01/06/2015   Procedure: CARPAL TUNNEL RELEASE;  Surgeon: HowEarnestine LeysD;  Location: [facility];  Service: Orthopedics;  Laterality: Left;   COLONOSCOPY WITH PROPOFOL N/A 10/08/2017   Procedure: COLONOSCOPY WITH PROPOFOL;  Surgeon: Lin Landsman, MD;  Location: [facility];  Service: Gastroenterology;  Laterality: N/A;   GASTRIC BYPASS  01/17/2000   KNEE SURGERY Right    knee trauma x3   prostate seeding     REVERSE SHOULDER ARTHROPLASTY Right 09/27/2021   Procedure: Right reverse shoulder arthroplasty, biceps tenodesis;  Surgeon: Leim Fabry, MD;  Location: [facility];  Service: Orthopedics;  Laterality: Right;   SHOULDER ARTHROSCOPY WITH ROTATOR CUFF REPAIR AND OPEN BICEPS TENODESIS Left 12/27/2021   Procedure:  Left shoulder arthroscopic cuff repair (supraspinatus and subscapularis) with Regeneten Patch application;  Surgeon: Leim Fabry, MD;  Location: [facility];  Service: Orthopedics;  Laterality: Left;   Patient Active Problem List   Diagnosis Date Noted   S/p reverse total shoulder arthroplasty 09/27/2021   Lesion of bone of lumbosacral spine (L5) 05/12/2021   Localized osteoarthritis of shoulder regions, bilateral 03/29/2021   Chronic pain of both shoulders 03/29/2021   Drug-induced myopathy 02/21/2021   Trigger finger of right hand 11/15/2020   Prostate cancer ([place]) 07/26/2020   Insomnia 08/01/2019   Primary osteoarthritis of both wrists 02/17/2019   Trigger middle finger of left hand 02/17/2019   Chronic pain syndrome 02/17/2019   Chronic radicular lumbar pain 10/08/2018   Lumbar radiculopathy 10/08/2018   Lumbar degenerative disc disease 10/08/2018   Lumbar facet arthropathy 10/08/2018   Lumbar facet joint syndrome 10/08/2018   Intervertebral disc disorder with radiculopathy of lumbosacral region    Osteoarthritis of knee 11/30/2017   Osteoarthritis of wrist 11/30/2017   Pernicious anemia 05/22/2016   Persistent atrial fibrillation ([place]) 12/20/2015   Carpal tunnel syndrome 11/16/2014   DJD (degenerative joint disease) of knee 10/27/2014   Bilateral carpal tunnel syndrome 10/27/2014   Morbid obesity with BMI of 50.0-59.9, adult ([place]) 10/27/2014   Mixed hyperlipidemia 10/27/2014   H/O gastric bypass 03/20/2014   Hyperkalemia 03/20/2014   History of prostate cancer 12/26/2013   Essential hypertension 12/26/2013   Encounter for anticoagulation discussion and counseling 12/26/2013   PCP: Olin Hauser, DO  REFERRING PROVIDER: Leim Fabry, MD  REFERRING DIAGNOSIS: Incomplete tear of left rotator cuff, unspecified whether traumatic M75.112   THERAPY DIAG: Chronic left shoulder pain  Chronic right shoulder pain  Muscle weakness (generalized)  RATIONALE FOR  EVALUATION AND TREATMENT: Rehabilitation  ONSET DATE: Surgery 12/27/21, Chronic L shoulder pain for multiple years  FOLLOW UP APPT WITH PROVIDER: Yes    FROM INITIAL EVALUATION SUBJECTIVE:  Chief Complaint: S/p L shoulder RTC repair, biceps tenodesis, and shoulder debridement  Pertinent History Pt with history of chronic bilateral shoulder pain. He underwent right reverse TSR and right biceps tenodesis on 09/27/21. He experienced post-operative instability in R shoulder however it improved and he was able to avoid a revision. On 12/27/21 he underwent left arthroscopic rotator cuff repair (subscapularis (full-thickness), supraspinatus (partial-thickness) side-to-side repair + Regeneten patch), left arthroscopic biceps tenodesis, and left extensive debridement of shoulder (glenohumeral and subacromial spaces). Per surgical note other intraoperative findings include Type 2 SLAP tear, degenerative anterior labral tearing, grade 2 degenerative changes to articular cartilage of humeral head, and significant posterior capsule synovitis. No significant post-operative complications. He arrives without abduction pillow and states that Dr. Posey Pronto advised him he doesn't have to wear the abduction pillow. He got behind on his pain medication today and states that today his shoulder has been the most painful he has experienced since surgery. He also noticed swelling in L lateral upper arm this morning. He did take his pain medication at 14:00, before PT evaluation.  He has a history of chronic back pain and underwent a lumbar facet, medial branch radiofrequency ablation on 01/02/22. At the time of the evaluation pt has not yet appreciated improvement in his back pain from this procedure. He has a past medical history of Cardiomyopathy (in  setting of Afib), DJD (degenerative joint disease) of knee, History of kidney stones, Intervertebral disc disorder with radiculopathy of lumbosacral region, Kidney stone (2012), Lower extremity edema, PAF (paroxysmal atrial fibrillation) ([place]), Pernicious anemia, Prostate cancer ([place]), Pulmonary nodule, right, and SVT (supraventricular tachycardia) ([place]). He also  has a past surgical history that includes Knee surgery; Gastric bypass (2002); prostate seeding; Carpal tunnel release (Right, 12/23/2014); Carpal tunnel release (Left, 01/06/2015); Colonoscopy with propofol (N/A, 10/08/2017); and Cardioversion (N/A, 05/15/2019).   Pain:  Pain Intensity: Present: 5/10, Best: 3/10, Worst: 10/10 Pain location: Anterior L shoulder Pain Quality: aching and throbbing Radiating: No  Numbness/Tingling: No Focal Weakness: Yes History of prior shoulder or neck/shoulder injury, pain, surgery, or therapy: Yes, chronic bilateral shoulder pain with R TSR. 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 No AROM or WB through LUE (per protocol), Passive elevation and ER in staged ROM to begin at week 4 (elevation 90 degrees weeks 4-6 with no ER; 0-120 weeks 6-8 with ER 0-30 degrees). No pulleys or cane until after 6 weeks, recommend no driving x 6-8 weeks.   Weight Bearing Restrictions: Yes No WB through LUE for at least 6 weeks  Living Environment Lives with: lives with their spouse Lives in: House/apartment  Patient Goals:  Improve L shoulder strength and pain. Pt would like to be able to play with/lift his grandson as well as take care of his property.   OBJECTIVE:   Patient Surveys  FOTO: To be completed QuickDASH: 47.7%  Cognition Patient is oriented to person, place, and time.  Recent memory is intact.  Remote memory is intact.  Attention span and concentration are intact.  Expressive speech is intact.  Patient's fund of knowledge is within normal limits for educational  level.    Gross Musculoskeletal Assessment Tremor: None Bulk: Mild swelling noted in L lateral upper arm. Pt denies chills, fevers, redness, or dischrage from inicisions Tone: Normal  Posture Forward head and rounded shoulders  AROM Full L wrist AROM flexion, extension, radial deviation, and ulnar deviation. MCP flexion and extension WNL. Unable to assess L shoulder and elbow AROM per protocol  PROM Full L elbow PROM flexion and extension however during first attempt at slow passive extension pt reports sudden pain in L shoulder which eventually improve after a couple minutes of rest. Full L elbow PROM pronation/supination.   LE MMT: Full L wrist strength for flexion, extension, radial deviation, and ulnar deviation. L grip strength is intact and strong. Deferred testing of L elbow and shoulder strength per protocol  Sensation Grossly intact to light touch throughout LUE as determined by testing dermatomes C2-T2. Proprioception and hot/cold testing deferred on this date.  Palpation Pt is generally tender to light palpation around anterior, lateral, and posterior L shoulder;    TODAY'S TREATMENT    SUBJECTIVE: Pt reports continued L shoulder pain and he still has a bruise. He had severe R shoulder pain overnight which woke him up from sleep early this morning. However it has since resolved.    PAIN: Denies resting R shoulder pain, L shoulder pain not rated;   TREATMENT   Ther-ex  All exercises performed in  upright positions L shoulder AAROM flexion with straight elbows to around 130 degrees x 15; L elbow AROM flexion 2 x 10; L elbow manually resisted extension 2 x 10; L shoulder isometric IR, ER, flexion, and extension at neutral with forearm vertical 3s hold x 10 each direction; R shoulder flexion from neutral to 90 degrees with 2# dumbbell 5 x 15; R elbow flexion with 2# dumbbell 2 x 10; R elbow extension with manual resistance 2 x 10; R shoulder internal rotation from forearm vertical to stomach with manual resistance from therapist 2 x 10; R shoulder external rotation from stomach to forearm vertical with manual resistance from therapist 2 x 10; Supine (propped up) 90 deg flexion rhythmic stabilization, 20 second intervals x 3 R and L Seated scapular retraction(holding stick) with PT manual resistance , 2x10 Ice pack applied to bilateral shoulders at end of session during review;   PATIENT EDUCATION:  Education details:  Pt educated throughout session about proper posture and technique with exercises. Improved exercise technique, movement at target joints, use of target muscles after min to mod verbal, visual, tactile cues. Person educated: Patient Education method: Explanation Education comprehension: verbalized understanding and returned demonstration   HOME EXERCISE PROGRAM: Access Code: [email or web address] URL: [email or web address] Date: 01/10/2022 Prepared by: Roxana Hires  Exercises - Wrist Flexion AROM  - 2 x daily - 7 x weekly - 2 sets - 10 reps - 3s hold - Wrist Extension AROM  - 2 x daily - 7 x weekly - 2 sets - 10 reps - 3s hold - Seated Scapular Retraction  - 2 x daily - 7 x weekly - 2 sets - 10 reps - 3s hold - Supported Elbow Flexion Extension PROM  - 2 x daily - 7 x weekly - 2 sets - 10 reps - 3s hold - Forearm Supination PROM (Mirrored)  - 2 x daily - 7 x weekly - 2 sets - 10 reps - 3s hold - Forearm Pronation PROM (Mirrored)  - 2 x daily - 7 x weekly - 2 sets  - 10 reps - 3s  hold - Seated Gripping Towel  - 2 x daily - 7 x weekly - 2 sets - 10 reps - 3s hold   ASSESSMENT:  CLINICAL IMPRESSION: Progressed intensity today with addition of manual resistance during scapular retraction and addition of supine rhythmic stabilization exercise.  Pt reports no increase in shoulder pain during either of those exercises.  Continued with more of an upright positioning today for both right and left shoulder. Pt encouraged to continue with slow progression of HEP, never pushing through pain or resistance. He will benefit from skilled PT to address deficits in range of motion and strength in order to improve overall function at home and with leisure activities such as caring for his property.   REHAB POTENTIAL: Good  CLINICAL DECISION MAKING: Evolving/moderate complexity  EVALUATION COMPLEXITY: Moderate   GOALS: Goals reviewed with patient? Yes  SHORT TERM GOALS: Target date: 02/14/22  Pt will be independent with HEP to improve strength and decrease right shoulder pain to improve pain-free function at home and work. Baseline:  Goal status: ONGOING   LONG TERM GOALS: Target date: 03/28/22  Pt will increase L shoulder FOTO to at least 51 to demonstrate significant improvement in function at home and with leisure activities related to L shoulder. Baseline: 01/03/22: To be completed; 01/24/22: 4; 02/28/22: 59; Goal status: ACHIEVED  2.  Pt will decrease worst L shoulder pain by at least 3 points on the NPRS in order to demonstrate clinically significant reduction in R shoulder pain. Baseline: 01/03/22: worst: 10/10; 02/28/22: 10/10; Goal status: ONGOING  3.  Pt will decrease quick DASH score by at least 8% in order to demonstrate clinically significant reduction in disability related to L shoulder pain        Baseline: 01/03/22: 47.7%; 02/28/22: 65.91% Goal status: ONGOING  4. Pt will demonstrate L shoulder flexion, abduction, IR, and ER strength of at least  4/5 in order to demonstrate improvement in strength and function       Baseline: 01/03/22: Unable to test per protocol; 02/28/22: At least 3/5 but unable to fully test due to protocol; Goal status: ONGOING  PLAN: PT FREQUENCY: 1-2x/week  PT DURATION: 12 weeks  PLANNED INTERVENTIONS: Therapeutic exercises, Therapeutic activity, Neuromuscular re-education, Balance training, Gait training, Patient/Family education, Joint manipulation, Joint mobilization, Vestibular training, Canalith repositioning, Aquatic Therapy, Dry Needling, Electrical stimulation, Spinal manipulation, Spinal mobilization, Cryotherapy, Moist heat, Traction, Ultrasound, Ionotophoresis '4mg'$ /ml Dexamethasone, and Manual therapy  PLAN FOR NEXT SESSION: Progress L shoulder ROM and strength per protocol, continue R shoulder strengthening  Merdis Delay, PT, DPT, OCS  [phone]  03/16/2022, 2:52 PM

## 2022-03-17 NOTE — Telephone Encounter (Signed)
Pt made aware of the below recommendations from Dr. Rockey Situ Pt agreeable to plan Cardioversion scheduled for 03/24/22 with Dr. Rockey Situ. Pt instructed to arrive by 6:30 am and have lab completed within 7 days prior Pt also verbalized understanding of importance of not missing a dose of xarelto.  Instructions uploaded to mychart for review.    Minna Merritts, MD  [facility] [place] Triage3 hours ago (8:05 AM)    EKG reviewed, still in atrial fibrillation We can set up cardioversion Would recommend he stay on current medications including amiodarone, anticoagulation Thx TGollan

## 2022-03-19 ENCOUNTER — Other Ambulatory Visit: Payer: Self-pay | Admitting: Family Medicine

## 2022-03-20 NOTE — Telephone Encounter (Signed)
Requested medication (s) are due for refill today: yes  Requested medication (s) are on the active medication list: yes  Last refill:  07/27/21   Future visit scheduled: yes  Notes to clinic:  Unable to refill per protocol, cannot delegate.    Requested Prescriptions  Pending Prescriptions Disp Refills   cyclobenzaprine (FLEXERIL) 10 MG tablet [Pharmacy Med Name: CYCLOBENZAPRINE 10 MG TAB] 90 tablet 3    Sig: TAKE ONE TABLET BY MOUTH THREE TIMES A DAY AS NEEDED FOR MUSCLE SPASM     Not Delegated - Analgesics:  Muscle Relaxants Failed - 03/19/2022  4:55 AM      Failed - This refill cannot be delegated      Passed - Valid encounter within last 6 months    Recent Outpatient Visits           3 weeks ago Persistent atrial fibrillation [facility])   [place], DO   1 month ago Left inguinal pain   [place], DO   3 months ago [place], DO   7 months ago Annual physical exam   [facility] Olin Hauser, DO   10 months ago Lumbar degenerative disc disease   [place], [place], DO       Future Appointments             In 1 month Gollan, Kathlene November, MD [place] [facility] [place]   In 4 months [place], [facility], [facility]

## 2022-03-21 ENCOUNTER — Ambulatory Visit: Payer: Medicare Other | Attending: Orthopedic Surgery

## 2022-03-21 DIAGNOSIS — M6281 Muscle weakness (generalized): Secondary | ICD-10-CM | POA: Diagnosis not present

## 2022-03-21 DIAGNOSIS — G8929 Other chronic pain: Secondary | ICD-10-CM | POA: Diagnosis not present

## 2022-03-21 DIAGNOSIS — M25512 Pain in left shoulder: Secondary | ICD-10-CM | POA: Diagnosis not present

## 2022-03-21 DIAGNOSIS — M25511 Pain in right shoulder: Secondary | ICD-10-CM | POA: Insufficient documentation

## 2022-03-21 NOTE — Therapy (Unsigned)
OUTPATIENT PHYSICAL THERAPY SHOULDER TREATMENT  Patient Name: [name] MRN: [id number] DOB:[birth year], 67 y.o., male Today's Date: 03/22/2022   PT End of Session - 03/21/22 1402     Visit Number 15    Number of Visits 25    Date for PT Re-Evaluation 03/28/22    Authorization Type [employer]    Authorization Time Period 01/03/22-03/28/22    PT Start Time 1400    PT Stop Time 1445    PT Time Calculation (min) 45 min    Activity Tolerance Patient tolerated treatment well    Behavior During Therapy WFL for tasks assessed/performed            Past Medical History:  Diagnosis Date   Anxiety    Aortic atherosclerosis ([place])    Cardiomyopathy (in setting of Afib)    a.) TTE 12/26/2013: EF 45-50%, mild ant and antsept HK. mild MR. Mod dil LA. nl RV fxn. Rhythm was Afib; b.) TTE 07/04/2019: EF 55%, mid-apical anteroseptal HK, mild MAC, mild Ao sclerosis, G2DD, RVSP 45.3   Chronic pain syndrome    a.) followed by pain management   Chronic, continuous use of opioids    a.) hydrocodone/APAP 7.5/325 mg; followed by pain management   Coronary artery disease 11/06/2014   a.) cCTA 11/06/2014: Ca score 224 (all in pLAD) -- 74th percentile for age/sex matched control   Diverticulosis    DJD (degenerative joint disease) of knee    History of hiatal hernia    History of kidney stones 2012   Hyperlipidemia    Hyperplastic colon polyp    Hypertension    Internal hemorrhoids    Intervertebral disc disorder with radiculopathy of lumbosacral region    Long term current use FULL DOSE (325 mg) aspirin    Long term current use of amiodarone    Long term current use of antithrombotics/antiplatelets    a.) rivaroxaban   Lower extremity edema    Mixed hyperlipidemia    Myalgia due to statin    Osteoarthritis    PAF (paroxysmal atrial fibrillation) ([place])    a.) CHA2DS2VASc = 4 (age, HTN, CHF, vascular disease history);  b.) s/p DCCV 03/13/2014 (200 J x1); c.) s/p DCCV 05/15/2019 (150 J x 1,  200 J x2); d.) rate/rhythm maintained on oral amiodarone + metoprolol succinate; chronically anticoagulated with rivaroxaban   Pernicious anemia    Prostate cancer ([place])    Pulmonary nodule, right    a. 10/2014 Cardiac CTA: 62m RLL nodule; b. 04/2015 CT Chest: stable 741mRLL nodule. No new nodules; 02/2017 CTA Chest: stable, benign, 51m9mLL pulm nodule.   Sleep difficulties    a.) takes melatonin + trazodone PRN   SVT (supraventricular tachycardia)    Tubular adenoma of colon    Past Surgical History:  Procedure Laterality Date   BICEPT TENODESIS Right 09/27/2021   Procedure: Right reverse shoulder arthroplasty, biceps tenodesis;  Surgeon: PatLeim FabryD;  Location: [facility];  Service: Orthopedics;  Laterality: Right;   CARDIOVERSION N/A 05/15/2019   Procedure: CARDIOVERSION;  Surgeon: GolMinna MerrittsD;  Location: [facility];  Service: Cardiovascular;  Laterality: N/A;   CARDIOVERSION N/A 03/13/2014   Procedure: CARDIOVERSION; [place] Tim[place]da Rog[place]   CARPAL TUNNEL RELEASE Right 12/23/2014   Procedure: CARPAL TUNNEL RELEASE;  Surgeon: HowEarnestine LeysD;  Location: [facility];  Service: Orthopedics;  Laterality: Right;   CARPAL TUNNEL RELEASE Left 01/06/2015   Procedure: CARPAL TUNNEL RELEASE;  Surgeon: HowEarnestine LeysD;  Location: [facility];  Service: Orthopedics;  Laterality: Left;   COLONOSCOPY WITH PROPOFOL N/A 10/08/2017   Procedure: COLONOSCOPY WITH PROPOFOL;  Surgeon: Lin Landsman, MD;  Location: [facility];  Service: Gastroenterology;  Laterality: N/A;   GASTRIC BYPASS  01/17/2000   KNEE SURGERY Right    knee trauma x3   prostate seeding     REVERSE SHOULDER ARTHROPLASTY Right 09/27/2021   Procedure: Right reverse shoulder arthroplasty, biceps tenodesis;  Surgeon: Leim Fabry, MD;  Location: [facility];  Service: Orthopedics;  Laterality: Right;   SHOULDER ARTHROSCOPY WITH ROTATOR CUFF REPAIR AND OPEN BICEPS TENODESIS Left 12/27/2021   Procedure:  Left shoulder arthroscopic cuff repair (supraspinatus and subscapularis) with Regeneten Patch application;  Surgeon: Leim Fabry, MD;  Location: [facility];  Service: Orthopedics;  Laterality: Left;   Patient Active Problem List   Diagnosis Date Noted   S/p reverse total shoulder arthroplasty 09/27/2021   Lesion of bone of lumbosacral spine (L5) 05/12/2021   Localized osteoarthritis of shoulder regions, bilateral 03/29/2021   Chronic pain of both shoulders 03/29/2021   Drug-induced myopathy 02/21/2021   Trigger finger of right hand 11/15/2020   Prostate cancer ([place]) 07/26/2020   Insomnia 08/01/2019   Primary osteoarthritis of both wrists 02/17/2019   Trigger middle finger of left hand 02/17/2019   Chronic pain syndrome 02/17/2019   Chronic radicular lumbar pain 10/08/2018   Lumbar radiculopathy 10/08/2018   Lumbar degenerative disc disease 10/08/2018   Lumbar facet arthropathy 10/08/2018   Lumbar facet joint syndrome 10/08/2018   Intervertebral disc disorder with radiculopathy of lumbosacral region    Osteoarthritis of knee 11/30/2017   Osteoarthritis of wrist 11/30/2017   Pernicious anemia 05/22/2016   Persistent atrial fibrillation ([place]) 12/20/2015   Carpal tunnel syndrome 11/16/2014   DJD (degenerative joint disease) of knee 10/27/2014   Bilateral carpal tunnel syndrome 10/27/2014   Morbid obesity with BMI of 50.0-59.9, adult ([place]) 10/27/2014   Mixed hyperlipidemia 10/27/2014   H/O gastric bypass 03/20/2014   Hyperkalemia 03/20/2014   History of prostate cancer 12/26/2013   Essential hypertension 12/26/2013   Encounter for anticoagulation discussion and counseling 12/26/2013   PCP: Olin Hauser, DO  REFERRING PROVIDER: Leim Fabry, MD  REFERRING DIAGNOSIS: Incomplete tear of left rotator cuff, unspecified whether traumatic M75.112   THERAPY DIAG: Chronic left shoulder pain  Chronic right shoulder pain  Muscle weakness (generalized)  RATIONALE FOR  EVALUATION AND TREATMENT: Rehabilitation  ONSET DATE: Surgery 12/27/21, Chronic L shoulder pain for multiple years  FOLLOW UP APPT WITH PROVIDER: Yes    FROM INITIAL EVALUATION SUBJECTIVE:  Chief Complaint: S/p L shoulder RTC repair, biceps tenodesis, and shoulder debridement  Pertinent History Pt with history of chronic bilateral shoulder pain. He underwent right reverse TSR and right biceps tenodesis on 09/27/21. He experienced post-operative instability in R shoulder however it improved and he was able to avoid a revision. On 12/27/21 he underwent left arthroscopic rotator cuff repair (subscapularis (full-thickness), supraspinatus (partial-thickness) side-to-side repair + Regeneten patch), left arthroscopic biceps tenodesis, and left extensive debridement of shoulder (glenohumeral and subacromial spaces). Per surgical note other intraoperative findings include Type 2 SLAP tear, degenerative anterior labral tearing, grade 2 degenerative changes to articular cartilage of humeral head, and significant posterior capsule synovitis. No significant post-operative complications. He arrives without abduction pillow and states that Dr. Posey Pronto advised him he doesn't have to wear the abduction pillow. He got behind on his pain medication today and states that today his shoulder has been the most painful he has experienced since surgery. He also noticed swelling in L lateral upper arm this morning. He did take his pain medication at 14:00, before PT evaluation.  He has a history of chronic back pain and underwent a lumbar facet, medial branch radiofrequency ablation on 01/02/22. At the time of the evaluation pt has not yet appreciated improvement in his back pain from this procedure. He has a past medical history of Cardiomyopathy (in  setting of Afib), DJD (degenerative joint disease) of knee, History of kidney stones, Intervertebral disc disorder with radiculopathy of lumbosacral region, Kidney stone (2012), Lower extremity edema, PAF (paroxysmal atrial fibrillation) ([place]), Pernicious anemia, Prostate cancer ([place]), Pulmonary nodule, right, and SVT (supraventricular tachycardia) ([place]). He also  has a past surgical history that includes Knee surgery; Gastric bypass (2002); prostate seeding; Carpal tunnel release (Right, 12/23/2014); Carpal tunnel release (Left, 01/06/2015); Colonoscopy with propofol (N/A, 10/08/2017); and Cardioversion (N/A, 05/15/2019).   Pain:  Pain Intensity: Present: 5/10, Best: 3/10, Worst: 10/10 Pain location: Anterior L shoulder Pain Quality: aching and throbbing Radiating: No  Numbness/Tingling: No Focal Weakness: Yes History of prior shoulder or neck/shoulder injury, pain, surgery, or therapy: Yes, chronic bilateral shoulder pain with R TSR. 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 No AROM or WB through LUE (per protocol), Passive elevation and ER in staged ROM to begin at week 4 (elevation 90 degrees weeks 4-6 with no ER; 0-120 weeks 6-8 with ER 0-30 degrees). No pulleys or cane until after 6 weeks, recommend no driving x 6-8 weeks.   Weight Bearing Restrictions: Yes No WB through LUE for at least 6 weeks  Living Environment Lives with: lives with their spouse Lives in: House/apartment  Patient Goals:  Improve L shoulder strength and pain. Pt would like to be able to play with/lift his grandson as well as take care of his property.   OBJECTIVE:   Patient Surveys  FOTO: To be completed QuickDASH: 47.7%  Cognition Patient is oriented to person, place, and time.  Recent memory is intact.  Remote memory is intact.  Attention span and concentration are intact.  Expressive speech is intact.  Patient's fund of knowledge is within normal limits for educational  level.    Gross Musculoskeletal Assessment Tremor: None Bulk: Mild swelling noted in L lateral upper arm. Pt denies chills, fevers, redness, or dischrage from inicisions Tone: Normal  Posture Forward head and rounded shoulders  AROM Full L wrist AROM flexion, extension, radial deviation, and ulnar deviation. MCP flexion and extension WNL. Unable to assess L shoulder and elbow AROM per protocol  PROM Full L elbow PROM flexion and extension however during first attempt at slow passive extension pt reports sudden pain in L shoulder which eventually improve after a couple minutes of rest. Full L elbow PROM pronation/supination.   LE MMT: Full L wrist strength for flexion, extension, radial deviation, and ulnar deviation. L grip strength is intact and strong. Deferred testing of L elbow and shoulder strength per protocol  Sensation Grossly intact to light touch throughout LUE as determined by testing dermatomes C2-T2. Proprioception and hot/cold testing deferred on this date.  Palpation Pt is generally tender to light palpation around anterior, lateral, and posterior L shoulder;    TODAY'S TREATMENT    SUBJECTIVE: Pt reports continued L shoulder pain with persistent bruise in upper arm. Pain in both shoulders is the worst at night. No episodes of R shoulder subluxation since last therapy session.    PAIN: Continued bilateral shoulder pain, worse in the L shoulder;   TREATMENT   Ther-ex  All exercises performed in  upright positions L shoulder AROM flexion with combination of both bent and straight elbow to around 130 degrees 2 x 10; L elbow AROM flexion 2 x 10; L elbow manually resisted extension 2 x 10; L wrist manually resisted flexion and extension x 10 each; L shoulder isometric IR, ER, flexion, and extension at neutral with forearm vertical 3s hold x 10 each direction; R shoulder flexion from neutral to 90 degrees with 4# dumbbell 2 x 9; R shoulder scaption from neutral to around 90 degrees x 10; R elbow flexion with 4# dumbbell 2 x 10; R elbow extension with manual resistance 2 x 10; R shoulder internal rotation from forearm vertical to stomach with manual resistance from therapist 2 x 10; R shoulder external rotation from stomach to forearm vertical with 4# DB 2 x 10; Ice pack applied to bilateral shoulders at end of session during review;   PATIENT EDUCATION:  Education details:  Pt educated throughout session about proper posture and technique with exercises. Improved exercise technique, movement at target joints, use of target muscles after min to mod verbal, visual, tactile cues. Person educated: Patient Education method: Explanation Education comprehension: verbalized understanding and returned demonstration   HOME EXERCISE PROGRAM: Access Code: [email or web address] URL: [email or web address] Date: 01/10/2022 Prepared by: Roxana Hires  Exercises - Wrist Flexion AROM  - 2 x daily - 7 x weekly - 2 sets - 10 reps - 3s hold - Wrist Extension AROM  - 2 x daily - 7 x weekly - 2 sets - 10 reps - 3s hold - Seated Scapular Retraction  - 2 x daily - 7 x weekly - 2 sets - 10 reps - 3s hold - Supported Elbow Flexion Extension PROM  - 2 x daily - 7 x weekly - 2 sets - 10 reps - 3s hold - Forearm Supination PROM (Mirrored)  - 2 x daily - 7 x weekly - 2 sets - 10 reps - 3s hold - Forearm Pronation PROM (Mirrored)  - 2 x daily - 7 x weekly - 2 sets - 10 reps - 3s hold - Seated Gripping Towel   -  2 x daily - 7 x weekly - 2 sets - 10 reps - 3s hold   ASSESSMENT:  CLINICAL IMPRESSION: Progressed resistance during R shoulder strengthening today. Pt fatigues around repetition 9 during both sets when performing flexion. Progressed L shoulder from AAROM to AROM and pt is able to perform with only intermittent mild increase in pain which can be modified by adjusting elbow position. Continued with more of an upright positioning today for both right and left shoulder. Also continued to progress both shoulders very slowly given his continued complaints of pain, especially in the L shoulder and worse in both shoulders at night. Pt encouraged to continue with slow progression of HEP, never pushing through pain or resistance. He does verbalize some concern that he might have torn a muscle in his L shoulder. He will benefit from skilled PT to address deficits in range of motion and strength in order to improve overall function at home and with leisure activities such as caring for his property.   REHAB POTENTIAL: Good  CLINICAL DECISION MAKING: Evolving/moderate complexity  EVALUATION COMPLEXITY: Moderate   GOALS: Goals reviewed with patient? Yes  SHORT TERM GOALS: Target date: 02/14/22  Pt will be independent with HEP to improve strength and decrease right shoulder pain to improve pain-free function at home and work. Baseline:  Goal status: ONGOING   LONG TERM GOALS: Target date: 03/28/22  Pt will increase L shoulder FOTO to at least 51 to demonstrate significant improvement in function at home and with leisure activities related to L shoulder. Baseline: 01/03/22: To be completed; 01/24/22: 4; 02/28/22: 59; Goal status: ACHIEVED  2.  Pt will decrease worst L shoulder pain by at least 3 points on the NPRS in order to demonstrate clinically significant reduction in R shoulder pain. Baseline: 01/03/22: worst: 10/10; 02/28/22: 10/10; Goal status: ONGOING  3.  Pt will decrease quick DASH score  by at least 8% in order to demonstrate clinically significant reduction in disability related to L shoulder pain        Baseline: 01/03/22: 47.7%; 02/28/22: 65.91% Goal status: ONGOING  4. Pt will demonstrate L shoulder flexion, abduction, IR, and ER strength of at least 4/5 in order to demonstrate improvement in strength and function       Baseline: 01/03/22: Unable to test per protocol; 02/28/22: At least 3/5 but unable to fully test due to protocol; Goal status: ONGOING  PLAN: PT FREQUENCY: 1-2x/week  PT DURATION: 12 weeks  PLANNED INTERVENTIONS: Therapeutic exercises, Therapeutic activity, Neuromuscular re-education, Balance training, Gait training, Patient/Family education, Joint manipulation, Joint mobilization, Vestibular training, Canalith repositioning, Aquatic Therapy, Dry Needling, Electrical stimulation, Spinal manipulation, Spinal mobilization, Cryotherapy, Moist heat, Traction, Ultrasound, Ionotophoresis '4mg'$ /ml Dexamethasone, and Manual therapy  PLAN FOR NEXT SESSION: Progress L shoulder ROM and strength per protocol, continue R shoulder strengthening  Lyndel Safe Vanessa Kampf PT, DPT, GCS  03/22/2022, 8:23 PM

## 2022-03-22 ENCOUNTER — Other Ambulatory Visit: Payer: Self-pay

## 2022-03-22 ENCOUNTER — Other Ambulatory Visit: Payer: Self-pay | Admitting: Cardiovascular Disease

## 2022-03-22 ENCOUNTER — Other Ambulatory Visit
Admission: RE | Admit: 2022-03-22 | Discharge: 2022-03-22 | Disposition: A | Discharge status: Home / Self Care | Payer: Medicare Other | Source: Ambulatory Visit | Attending: Cardiovascular Disease | Admitting: Cardiovascular Disease

## 2022-03-22 DIAGNOSIS — I2584 Coronary atherosclerosis due to calcified coronary lesion: Secondary | ICD-10-CM | POA: Diagnosis not present

## 2022-03-22 DIAGNOSIS — I4819 Other persistent atrial fibrillation: Secondary | ICD-10-CM

## 2022-03-22 DIAGNOSIS — I2510 Atherosclerotic heart disease of native coronary artery without angina pectoris: Secondary | ICD-10-CM | POA: Insufficient documentation

## 2022-03-22 DIAGNOSIS — Z01810 Encounter for preprocedural cardiovascular examination: Secondary | ICD-10-CM

## 2022-03-22 LAB — BASIC METABOLIC PANEL
Anion gap: 6 (ref 5–15)
BUN: 14 mg/dL (ref 8–23)
CO2: 24 mmol/L (ref 22–32)
Calcium: 8.7 mg/dL — ABNORMAL LOW (ref 8.9–10.3)
Chloride: 109 mmol/L (ref 98–111)
Creatinine, Ser: 0.97 mg/dL (ref 0.61–1.24)
GFR, Estimated: >60 mL/min (ref >60)
Glucose, Bld: 113 mg/dL — ABNORMAL HIGH (ref 70–99)
Potassium: 4.4 mmol/L (ref 3.5–5.1)
Sodium: 139 mmol/L (ref 135–145)

## 2022-03-22 LAB — CBC
HCT: 35.1 % — ABNORMAL LOW (ref 39.0–52.0)
Hemoglobin: 10.3 g/dL — ABNORMAL LOW (ref 13.0–17.0)
MCH: 20.5 pg — ABNORMAL LOW (ref 26.0–34.0)
MCHC: 29.3 g/dL — ABNORMAL LOW (ref 30.0–36.0)
MCV: 69.8 fL — ABNORMAL LOW (ref 80.0–100.0)
Platelets: 250 10*3/uL (ref 150–400)
RBC: 5.03 MIL/uL (ref 4.22–5.81)
RDW: 17.8 % — ABNORMAL HIGH (ref 11.5–15.5)
WBC: 7.4 10*3/uL (ref 4.0–10.5)
nRBC: 0 % (ref 0.0–0.2)

## 2022-03-22 NOTE — Progress Notes (Signed)
Lab called and stated that patient presented to lab to have blood work drawn prior to procedure. Orders placed

## 2022-03-23 ENCOUNTER — Ambulatory Visit: Payer: Medicare Other

## 2022-03-23 DIAGNOSIS — M25511 Pain in right shoulder: Secondary | ICD-10-CM | POA: Diagnosis not present

## 2022-03-23 DIAGNOSIS — M6281 Muscle weakness (generalized): Secondary | ICD-10-CM | POA: Diagnosis not present

## 2022-03-23 DIAGNOSIS — G8929 Other chronic pain: Secondary | ICD-10-CM

## 2022-03-23 DIAGNOSIS — M25512 Pain in left shoulder: Secondary | ICD-10-CM | POA: Diagnosis not present

## 2022-03-23 NOTE — Therapy (Signed)
OUTPATIENT PHYSICAL THERAPY SHOULDER TREATMENT  Patient Name: [name] MRN: [id number] DOB:[birth year], 68 y.o., male Today's Date: 03/23/2022   PT End of Session - 03/23/22 1358     Visit Number 16    Number of Visits 25    Date for PT Re-Evaluation 03/28/22    Authorization Type [employer] Medicare    Authorization Time Period 01/03/22-03/28/22    PT Start Time 1400    PT Stop Time 1445    PT Time Calculation (min) 45 min    Activity Tolerance Patient tolerated treatment well    Behavior During Therapy WFL for tasks assessed/performed            Past Medical History:  Diagnosis Date   Anxiety    Aortic atherosclerosis ([place])    Cardiomyopathy (in setting of Afib)    a.) TTE 12/26/2013: EF 45-50%, mild ant and antsept HK. mild MR. Mod dil LA. nl RV fxn. Rhythm was Afib; b.) TTE 07/04/2019: EF 55%, mid-apical anteroseptal HK, mild MAC, mild Ao sclerosis, G2DD, RVSP 45.3   Chronic pain syndrome    a.) followed by pain management   Chronic, continuous use of opioids    a.) hydrocodone/APAP 7.5/325 mg; followed by pain management   Coronary artery disease 11/06/2014   a.) cCTA 11/06/2014: Ca score 224 (all in pLAD) -- 74th percentile for age/sex matched control   Diverticulosis    DJD (degenerative joint disease) of knee    History of hiatal hernia    History of kidney stones 2012   Hyperlipidemia    Hyperplastic colon polyp    Hypertension    Internal hemorrhoids    Intervertebral disc disorder with radiculopathy of lumbosacral region    Long term current use FULL DOSE (325 mg) aspirin    Long term current use of amiodarone    Long term current use of antithrombotics/antiplatelets    a.) rivaroxaban   Lower extremity edema    Mixed hyperlipidemia    Myalgia due to statin    Osteoarthritis    PAF (paroxysmal atrial fibrillation) ([place])    a.) CHA2DS2VASc = 4 (age, HTN, CHF, vascular disease history);  b.) s/p DCCV 03/13/2014 (200 J x1); c.) s/p DCCV 05/15/2019 (150 J x 1,  200 J x2); d.) rate/rhythm maintained on oral amiodarone + metoprolol succinate; chronically anticoagulated with rivaroxaban   Pernicious anemia    Prostate cancer ([place])    Pulmonary nodule, right    a. 10/2014 Cardiac CTA: 2m RLL nodule; b. 04/2015 CT Chest: stable 736mRLL nodule. No new nodules; 02/2017 CTA Chest: stable, benign, 54m21mLL pulm nodule.   Sleep difficulties    a.) takes melatonin + trazodone PRN   SVT (supraventricular tachycardia)    Tubular adenoma of colon    Past Surgical History:  Procedure Laterality Date   BICEPT TENODESIS Right 09/27/2021   Procedure: Right reverse shoulder arthroplasty, biceps tenodesis;  Surgeon: PatLeim FabryD;  Location: [facility];  Service: Orthopedics;  Laterality: Right;   CARDIOVERSION N/A 05/15/2019   Procedure: CARDIOVERSION;  Surgeon: GolMinna MerrittsD;  Location: [facility];  Service: Cardiovascular;  Laterality: N/A;   CARDIOVERSION N/A 03/13/2014   Procedure: CARDIOVERSION; [facility] TimIda RogueD   CARPAL TUNNEL RELEASE Right 12/23/2014   Procedure: CARPAL TUNNEL RELEASE;  Surgeon: HowEarnestine LeysD;  Location: [facility];  Service: Orthopedics;  Laterality: Right;   CARPAL TUNNEL RELEASE Left 01/06/2015   Procedure: CARPAL TUNNEL RELEASE;  Surgeon: HowEarnestine LeysD;  Location: [facility];  Service: Orthopedics;  Laterality: Left;   COLONOSCOPY WITH PROPOFOL N/A 10/08/2017   Procedure: COLONOSCOPY WITH PROPOFOL;  Surgeon: Lin Landsman, MD;  Location: [facility];  Service: Gastroenterology;  Laterality: N/A;   GASTRIC BYPASS  01/17/2000   KNEE SURGERY Right    knee trauma x3   prostate seeding     REVERSE SHOULDER ARTHROPLASTY Right 09/27/2021   Procedure: Right reverse shoulder arthroplasty, biceps tenodesis;  Surgeon: Leim Fabry, MD;  Location: [facility];  Service: Orthopedics;  Laterality: Right;   SHOULDER ARTHROSCOPY WITH ROTATOR CUFF REPAIR AND OPEN BICEPS TENODESIS Left 12/27/2021   Procedure:  Left shoulder arthroscopic cuff repair (supraspinatus and subscapularis) with Regeneten Patch application;  Surgeon: Leim Fabry, MD;  Location: [facility];  Service: Orthopedics;  Laterality: Left;   Patient Active Problem List   Diagnosis Date Noted   S/p reverse total shoulder arthroplasty 09/27/2021   Lesion of bone of lumbosacral spine (L5) 05/12/2021   Localized osteoarthritis of shoulder regions, bilateral 03/29/2021   Chronic pain of both shoulders 03/29/2021   Drug-induced myopathy 02/21/2021   Trigger finger of right hand 11/15/2020   Prostate cancer ([place]) 07/26/2020   Insomnia 08/01/2019   Primary osteoarthritis of both wrists 02/17/2019   Trigger middle finger of left hand 02/17/2019   Chronic pain syndrome 02/17/2019   Chronic radicular lumbar pain 10/08/2018   Lumbar radiculopathy 10/08/2018   Lumbar degenerative disc disease 10/08/2018   Lumbar facet arthropathy 10/08/2018   Lumbar facet joint syndrome 10/08/2018   Intervertebral disc disorder with radiculopathy of lumbosacral region    Osteoarthritis of knee 11/30/2017   Osteoarthritis of wrist 11/30/2017   Pernicious anemia 05/22/2016   Persistent atrial fibrillation ([place]) 12/20/2015   Carpal tunnel syndrome 11/16/2014   DJD (degenerative joint disease) of knee 10/27/2014   Bilateral carpal tunnel syndrome 10/27/2014   Morbid obesity with BMI of 50.0-59.9, adult ([place]) 10/27/2014   Mixed hyperlipidemia 10/27/2014   H/O gastric bypass 03/20/2014   Hyperkalemia 03/20/2014   History of prostate cancer 12/26/2013   Essential hypertension 12/26/2013   Encounter for anticoagulation discussion and counseling 12/26/2013   PCP: Olin Hauser, DO  REFERRING PROVIDER: Leim Fabry, MD  REFERRING DIAGNOSIS: Incomplete tear of left rotator cuff, unspecified whether traumatic M75.112   THERAPY DIAG: Chronic left shoulder pain  Chronic right shoulder pain  Muscle weakness (generalized)  RATIONALE FOR  EVALUATION AND TREATMENT: Rehabilitation  ONSET DATE: Surgery 12/27/21, Chronic L shoulder pain for multiple years  FOLLOW UP APPT WITH PROVIDER: Yes    FROM INITIAL EVALUATION SUBJECTIVE:  Chief Complaint: S/p L shoulder RTC repair, biceps tenodesis, and shoulder debridement  Pertinent History Pt with history of chronic bilateral shoulder pain. He underwent right reverse TSR and right biceps tenodesis on 09/27/21. He experienced post-operative instability in R shoulder however it improved and he was able to avoid a revision. On 12/27/21 he underwent left arthroscopic rotator cuff repair (subscapularis (full-thickness), supraspinatus (partial-thickness) side-to-side repair + Regeneten patch), left arthroscopic biceps tenodesis, and left extensive debridement of shoulder (glenohumeral and subacromial spaces). Per surgical note other intraoperative findings include Type 2 SLAP tear, degenerative anterior labral tearing, grade 2 degenerative changes to articular cartilage of humeral head, and significant posterior capsule synovitis. No significant post-operative complications. He arrives without abduction pillow and states that Dr. Posey Pronto advised him he doesn't have to wear the abduction pillow. He got behind on his pain medication today and states that today his shoulder has been the most painful he has experienced since surgery. He also noticed swelling in L lateral upper arm this morning. He did take his pain medication at 14:00, before PT evaluation.  He has a history of chronic back pain and underwent a lumbar facet, medial branch radiofrequency ablation on 01/02/22. At the time of the evaluation pt has not yet appreciated improvement in his back pain from this procedure. He has a past medical history of Cardiomyopathy (in  setting of Afib), DJD (degenerative joint disease) of knee, History of kidney stones, Intervertebral disc disorder with radiculopathy of lumbosacral region, Kidney stone (2012), Lower extremity edema, PAF (paroxysmal atrial fibrillation) ([place]), Pernicious anemia, Prostate cancer ([place]), Pulmonary nodule, right, and SVT (supraventricular tachycardia) ([place]). He also  has a past surgical history that includes Knee surgery; Gastric bypass (2002); prostate seeding; Carpal tunnel release (Right, 12/23/2014); Carpal tunnel release (Left, 01/06/2015); Colonoscopy with propofol (N/A, 10/08/2017); and Cardioversion (N/A, 05/15/2019).   Pain:  Pain Intensity: Present: 5/10, Best: 3/10, Worst: 10/10 Pain location: Anterior L shoulder Pain Quality: aching and throbbing Radiating: No  Numbness/Tingling: No Focal Weakness: Yes History of prior shoulder or neck/shoulder injury, pain, surgery, or therapy: Yes, chronic bilateral shoulder pain with R TSR. 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 No AROM or WB through LUE (per protocol), Passive elevation and ER in staged ROM to begin at week 4 (elevation 90 degrees weeks 4-6 with no ER; 0-120 weeks 6-8 with ER 0-30 degrees). No pulleys or cane until after 6 weeks, recommend no driving x 6-8 weeks.   Weight Bearing Restrictions: Yes No WB through LUE for at least 6 weeks  Living Environment Lives with: lives with their spouse Lives in: House/apartment  Patient Goals:  Improve L shoulder strength and pain. Pt would like to be able to play with/lift his grandson as well as take care of his property.   OBJECTIVE:   Patient Surveys  FOTO: To be completed QuickDASH: 47.7%  Cognition Patient is oriented to person, place, and time.  Recent memory is intact.  Remote memory is intact.  Attention span and concentration are intact.  Expressive speech is intact.  Patient's fund of knowledge is within normal limits for educational  level.    Gross Musculoskeletal Assessment Tremor: None Bulk: Mild swelling noted in L lateral upper arm. Pt denies chills, fevers, redness, or dischrage from inicisions Tone: Normal  Posture Forward head and rounded shoulders  AROM Full L wrist AROM flexion, extension, radial deviation, and ulnar deviation. MCP flexion and extension WNL. Unable to assess L shoulder and elbow AROM per protocol  PROM Full L elbow PROM flexion and extension however during first attempt at slow passive extension pt reports sudden pain in L shoulder which eventually improve after a couple minutes of rest. Full L elbow PROM pronation/supination.   LE MMT: Full L wrist strength for flexion, extension, radial deviation, and ulnar deviation. L grip strength is intact and strong. Deferred testing of L elbow and shoulder strength per protocol  Sensation Grossly intact to light touch throughout LUE as determined by testing dermatomes C2-T2. Proprioception and hot/cold testing deferred on this date.  Palpation Pt is generally tender to light palpation around anterior, lateral, and posterior L shoulder;    TODAY'S TREATMENT    SUBJECTIVE: Pt reports he had a good 2 nights sleep since last session.  He overall feels better than he did at last appointment.      PAIN: Continued bilateral shoulder pain, worse in the L shoulder;   TREATMENT   Ther-ex  All exercises performed in upright positions L shoulder AROM flexion with combination of both  bent and straight elbow to around 130 degrees 2 x 10; L elbow AROM flexion 2 x 10; L elbow manually resisted extension 2 x 10; L wrist manually resisted flexion and extension x 10 each; L shoulder isometric IR, ER, flexion, and extension at neutral with forearm vertical 3s hold x 10 each direction; R shoulder flexion from neutral to 90 degrees with 4# dumbbell 2 x 9; R shoulder scaption from neutral to around 90 degrees x 10; R elbow flexion with 4# dumbbell 2 x 10; R elbow extension with manual resistance 2 x 10; R shoulder internal rotation from forearm vertical to stomach with manual resistance from therapist 2 x 10; R shoulder external rotation from stomach to forearm vertical with 4# DB 2 x 10; Ice pack applied to bilateral shoulders at end of session during review;   PATIENT EDUCATION:  Education details:  Pt educated throughout session about proper posture and technique with exercises. Improved exercise technique, movement at target joints, use of target muscles after min to mod verbal, visual, tactile cues. Person educated: Patient Education method: Explanation Education comprehension: verbalized understanding and returned demonstration   HOME EXERCISE PROGRAM: Access Code: [email or web address] URL: [email or web address] Date: 01/10/2022 Prepared by: Roxana Hires  Exercises - Wrist Flexion AROM  - 2 x daily - 7 x weekly - 2 sets - 10 reps - 3s hold - Wrist Extension AROM  - 2 x daily - 7 x weekly - 2 sets - 10 reps - 3s hold - Seated Scapular Retraction  - 2 x daily - 7 x weekly - 2 sets - 10 reps - 3s hold - Supported Elbow Flexion Extension PROM  - 2 x daily - 7 x weekly - 2 sets - 10 reps - 3s hold - Forearm Supination PROM (Mirrored)  - 2 x daily - 7 x weekly - 2 sets - 10 reps - 3s hold - Forearm Pronation PROM (Mirrored)  - 2 x daily - 7 x weekly - 2 sets - 10 reps - 3s hold - Seated Gripping Towel  - 2 x daily -  7 x weekly - 2 sets - 10 reps - 3s  hold   ASSESSMENT:  CLINICAL IMPRESSION: Pt able to perform shoulder strengthening on R UE without any report of pain during or after.  Mild L shoulder discomfort noted but, also able to perform L shoulder AROM without significant increase in pain during or after.  Ended with cold pack for pain control.  Pt encouraged to continue with slow progression of HEP, never pushing through pain or resistance. He will benefit from skilled PT to address deficits in range of motion and strength in order to improve overall function at home and with leisure activities such as caring for his property.   REHAB POTENTIAL: Good  CLINICAL DECISION MAKING: Evolving/moderate complexity  EVALUATION COMPLEXITY: Moderate   GOALS: Goals reviewed with patient? Yes  SHORT TERM GOALS: Target date: 02/14/22  Pt will be independent with HEP to improve strength and decrease right shoulder pain to improve pain-free function at home and work. Baseline:  Goal status: ONGOING   LONG TERM GOALS: Target date: 03/28/22  Pt will increase L shoulder FOTO to at least 51 to demonstrate significant improvement in function at home and with leisure activities related to L shoulder. Baseline: 01/03/22: To be completed; 01/24/22: 4; 02/28/22: 59; Goal status: ACHIEVED  2.  Pt will decrease worst L shoulder pain by at least 3 points on the NPRS in order to demonstrate clinically significant reduction in R shoulder pain. Baseline: 01/03/22: worst: 10/10; 02/28/22: 10/10; Goal status: ONGOING  3.  Pt will decrease quick DASH score by at least 8% in order to demonstrate clinically significant reduction in disability related to L shoulder pain        Baseline: 01/03/22: 47.7%; 02/28/22: 65.91% Goal status: ONGOING  4. Pt will demonstrate L shoulder flexion, abduction, IR, and ER strength of at least 4/5 in order to demonstrate improvement in strength and function       Baseline: 01/03/22: Unable to test per protocol; 02/28/22: At  least 3/5 but unable to fully test due to protocol; Goal status: ONGOING  PLAN: PT FREQUENCY: 1-2x/week  PT DURATION: 12 weeks  PLANNED INTERVENTIONS: Therapeutic exercises, Therapeutic activity, Neuromuscular re-education, Balance training, Gait training, Patient/Family education, Joint manipulation, Joint mobilization, Vestibular training, Canalith repositioning, Aquatic Therapy, Dry Needling, Electrical stimulation, Spinal manipulation, Spinal mobilization, Cryotherapy, Moist heat, Traction, Ultrasound, Ionotophoresis '4mg'$ /ml Dexamethasone, and Manual therapy  PLAN FOR NEXT SESSION: Progress L shoulder ROM and strength per protocol, continue R shoulder strengthening Merdis Delay, PT, DPT, OCS  [phone]  03/23/2022, 1:58 PM

## 2022-03-24 ENCOUNTER — Encounter
Admission: RE | Disposition: A | Discharge status: Home / Self Care | Payer: Self-pay | Source: Home / Self Care | Attending: Cardiovascular Disease

## 2022-03-24 ENCOUNTER — Ambulatory Visit: Payer: Medicare Other | Admitting: Anesthesiology

## 2022-03-24 ENCOUNTER — Other Ambulatory Visit: Payer: Self-pay

## 2022-03-24 ENCOUNTER — Encounter: Payer: Self-pay | Admitting: Cardiovascular Disease

## 2022-03-24 ENCOUNTER — Ambulatory Visit
Admission: RE | Admit: 2022-03-24 | Discharge: 2022-03-24 | Disposition: A | Discharge status: Home / Self Care | Payer: Medicare Other | Attending: Cardiovascular Disease | Admitting: Cardiovascular Disease

## 2022-03-24 DIAGNOSIS — I429 Cardiomyopathy, unspecified: Secondary | ICD-10-CM | POA: Diagnosis not present

## 2022-03-24 DIAGNOSIS — Z9884 Bariatric surgery status: Secondary | ICD-10-CM | POA: Insufficient documentation

## 2022-03-24 DIAGNOSIS — I2510 Atherosclerotic heart disease of native coronary artery without angina pectoris: Secondary | ICD-10-CM | POA: Diagnosis not present

## 2022-03-24 DIAGNOSIS — G709 Myoneural disorder, unspecified: Secondary | ICD-10-CM | POA: Insufficient documentation

## 2022-03-24 DIAGNOSIS — M1990 Unspecified osteoarthritis, unspecified site: Secondary | ICD-10-CM | POA: Diagnosis not present

## 2022-03-24 DIAGNOSIS — K219 Gastro-esophageal reflux disease without esophagitis: Secondary | ICD-10-CM | POA: Insufficient documentation

## 2022-03-24 DIAGNOSIS — I10 Essential (primary) hypertension: Secondary | ICD-10-CM | POA: Insufficient documentation

## 2022-03-24 DIAGNOSIS — I4819 Other persistent atrial fibrillation: Secondary | ICD-10-CM | POA: Diagnosis not present

## 2022-03-24 DIAGNOSIS — Z87891 Personal history of nicotine dependence: Secondary | ICD-10-CM | POA: Diagnosis not present

## 2022-03-24 DIAGNOSIS — R0602 Shortness of breath: Secondary | ICD-10-CM

## 2022-03-24 DIAGNOSIS — Z8546 Personal history of malignant neoplasm of prostate: Secondary | ICD-10-CM | POA: Diagnosis not present

## 2022-03-24 HISTORY — PX: CARDIOVERSION: SHX1299

## 2022-03-24 SURGERY — CARDIOVERSION
Anesthesia: General

## 2022-03-24 MED ORDER — PROPOFOL 10 MG/ML IV BOLUS
INTRAVENOUS | Status: DC | PRN
  Administered 2022-03-24: 10 mg via INTRAVENOUS
  Administered 2022-03-24: 70 mg via INTRAVENOUS
  Administered 2022-03-24: 30 mg via INTRAVENOUS
  Administered 2022-03-24: 10 mg via INTRAVENOUS

## 2022-03-24 MED ORDER — SODIUM CHLORIDE 0.9 % IV SOLN: INTRAVENOUS | Status: DC

## 2022-03-24 MED ORDER — PROPOFOL 10 MG/ML IV BOLUS
INTRAVENOUS | Status: AC
  Filled 2022-03-24: qty 20

## 2022-03-24 NOTE — Anesthesia Postprocedure Evaluation (Signed)
Anesthesia Post Note  Patient: [name]  Procedure(s) Performed: CARDIOVERSION  Patient location during evaluation: PACU Anesthesia Type: General Level of consciousness: awake and alert Pain management: pain level controlled Vital Signs Assessment: post-procedure vital signs reviewed and stable Respiratory status: spontaneous breathing, nonlabored ventilation, respiratory function stable and patient connected to nasal cannula oxygen Cardiovascular status: blood pressure returned to baseline and stable Postop Assessment: no apparent nausea or vomiting Anesthetic complications: no   No notable events documented.   Last Vitals:  Vitals:   03/24/22 0748 03/24/22 0800  BP: 119/68 120/83  Pulse: (!) 46 (!) 47  Resp: 16 15  Temp:    SpO2: 98% 97%    Last Pain:  Vitals:   03/24/22 0800  TempSrc:   PainSc: 0-No pain                 Ilene Qua

## 2022-03-24 NOTE — Therapy (Signed)
OUTPATIENT PHYSICAL THERAPY SHOULDER TREATMENT/RECERTIFICATION  Patient Name: MYSHAWN FORBUSH MRN: [id number] DOB:May 07, 1955, 67 y.o., male Today's Date: 03/24/2022    Past Medical History:  Diagnosis Date   Anxiety    Aortic atherosclerosis ([place])    Cardiomyopathy (in setting of Afib)    a.) TTE 12/26/2013: EF 45-50%, mild ant and antsept HK. mild MR. Mod dil LA. nl RV fxn. Rhythm was Afib; b.) TTE 07/04/2019: EF 55%, mid-apical anteroseptal HK, mild MAC, mild Ao sclerosis, G2DD, RVSP 45.3   Chronic pain syndrome    a.) followed by pain management   Chronic, continuous use of opioids    a.) hydrocodone/APAP 7.5/325 mg; followed by pain management   Coronary artery disease 11/06/2014   a.) cCTA 11/06/2014: Ca score 224 (all in pLAD) -- 74th percentile for age/sex matched control   Diverticulosis    DJD (degenerative joint disease) of knee    History of hiatal hernia    History of kidney stones 2012   Hyperlipidemia    Hyperplastic colon polyp    Hypertension    Internal hemorrhoids    Intervertebral disc disorder with radiculopathy of lumbosacral region    Long term current use FULL DOSE (325 mg) aspirin    Long term current use of amiodarone    Long term current use of antithrombotics/antiplatelets    a.) rivaroxaban   Lower extremity edema    Mixed hyperlipidemia    Myalgia due to statin    Osteoarthritis    PAF (paroxysmal atrial fibrillation) ([place])    a.) CHA2DS2VASc = 4 (age, HTN, CHF, vascular disease history);  b.) s/p DCCV 03/13/2014 (200 J x1); c.) s/p DCCV 05/15/2019 (150 J x 1, 200 J x2); d.) rate/rhythm maintained on oral amiodarone + metoprolol succinate; chronically anticoagulated with rivaroxaban   Pernicious anemia    Prostate cancer ([place])    Pulmonary nodule, right    a. 10/2014 Cardiac CTA: 62m RLL nodule; b. 04/2015 CT Chest: stable 745mRLL nodule. No new nodules; 02/2017 CTA Chest: stable, benign, 71m42mLL pulm nodule.   Sleep difficulties    a.) takes  melatonin + trazodone PRN   SVT (supraventricular tachycardia)    Tubular adenoma of colon    Past Surgical History:  Procedure Laterality Date   BICEPT TENODESIS Right 09/27/2021   Procedure: Right reverse shoulder arthroplasty, biceps tenodesis;  Surgeon: PatLeim FabryD;  Location: [facility];  Service: Orthopedics;  Laterality: Right;   CARDIOVERSION N/A 05/15/2019   Procedure: CARDIOVERSION;  Surgeon: GolMinna MerrittsD;  Location: [facility];  Service: Cardiovascular;  Laterality: N/A;   CARDIOVERSION N/A 03/13/2014   Procedure: CARDIOVERSION; [facility] TimIda RogueD   CARPAL TUNNEL RELEASE Right 12/23/2014   Procedure: CARPAL TUNNEL RELEASE;  Surgeon: HowEarnestine LeysD;  Location: [facility];  Service: Orthopedics;  Laterality: Right;   CARPAL TUNNEL RELEASE Left 01/06/2015   Procedure: CARPAL TUNNEL RELEASE;  Surgeon: HowEarnestine LeysD;  Location: [facility];  Service: Orthopedics;  Laterality: Left;   COLONOSCOPY WITH PROPOFOL N/A 10/08/2017   Procedure: COLONOSCOPY WITH PROPOFOL;  Surgeon: VanLin LandsmanD;  [facility];  Service: Gastroenterology;  Laterality: N/A;   GASTRIC BYPASS  01/17/2000   KNEE SURGERY Right    knee trauma x3   prostate seeding     REVERSE SHOULDER ARTHROPLASTY Right 09/27/2021   Procedure: Right reverse shoulder arthroplasty, biceps tenodesis;  Surgeon: PatLeim FabryD;  Location: [facility];  Service: Orthopedics;  Laterality: Right;   SHOULDER  ARTHROSCOPY WITH ROTATOR CUFF REPAIR AND OPEN BICEPS TENODESIS Left 12/27/2021   Procedure: Left shoulder arthroscopic cuff repair (supraspinatus and subscapularis) with Regeneten Patch application;  Surgeon: Leim Fabry, MD;  Location: [facility];  Service: Orthopedics;  Laterality: Left;   Patient Active Problem List   Diagnosis Date Noted   S/p reverse total shoulder arthroplasty 09/27/2021   Lesion of bone of lumbosacral spine (L5) 05/12/2021   Localized osteoarthritis of  shoulder regions, bilateral 03/29/2021   Chronic pain of both shoulders 03/29/2021   Drug-induced myopathy 02/21/2021   Trigger finger of right hand 11/15/2020   Prostate cancer ([place]) 07/26/2020   Insomnia 08/01/2019   Primary osteoarthritis of both wrists 02/17/2019   Trigger middle finger of left hand 02/17/2019   Chronic pain syndrome 02/17/2019   Chronic radicular lumbar pain 10/08/2018   Lumbar radiculopathy 10/08/2018   Lumbar degenerative disc disease 10/08/2018   Lumbar facet arthropathy 10/08/2018   Lumbar facet joint syndrome 10/08/2018   Intervertebral disc disorder with radiculopathy of lumbosacral region    Osteoarthritis of knee 11/30/2017   Osteoarthritis of wrist 11/30/2017   Pernicious anemia 05/22/2016   Persistent atrial fibrillation ([place]) 12/20/2015   Carpal tunnel syndrome 11/16/2014   DJD (degenerative joint disease) of knee 10/27/2014   Bilateral carpal tunnel syndrome 10/27/2014   Morbid obesity with BMI of 50.0-59.9, adult ([place]) 10/27/2014   Mixed hyperlipidemia 10/27/2014   H/O gastric bypass 03/20/2014   Hyperkalemia 03/20/2014   History of prostate cancer 12/26/2013   Essential hypertension 12/26/2013   Encounter for anticoagulation discussion and counseling 12/26/2013   PCP: Olin Hauser, DO  REFERRING PROVIDER: Leim Fabry, MD  REFERRING DIAGNOSIS: Incomplete tear of left rotator cuff, unspecified whether traumatic M75.112   THERAPY DIAG: Chronic left shoulder pain  Chronic right shoulder pain  Muscle weakness (generalized)  RATIONALE FOR EVALUATION AND TREATMENT: Rehabilitation  ONSET DATE: Surgery 12/27/21, Chronic L shoulder pain for multiple years  FOLLOW UP APPT WITH PROVIDER: Yes    FROM INITIAL EVALUATION SUBJECTIVE:                                                                                                                                                                                         Chief Complaint:  S/p L shoulder RTC repair, biceps tenodesis, and shoulder debridement  Pertinent History Pt with history of chronic bilateral shoulder pain. He underwent right reverse TSR and right biceps tenodesis on 09/27/21. He experienced post-operative instability in R shoulder however it improved and he was able to avoid a revision. On 12/27/21 he underwent left arthroscopic rotator cuff repair (subscapularis (full-thickness), supraspinatus (partial-thickness) side-to-side repair + Regeneten  patch), left arthroscopic biceps tenodesis, and left extensive debridement of shoulder (glenohumeral and subacromial spaces). Per surgical note other intraoperative findings include Type 2 SLAP tear, degenerative anterior labral tearing, grade 2 degenerative changes to articular cartilage of humeral head, and significant posterior capsule synovitis. No significant post-operative complications. He arrives without abduction pillow and states that Dr. Posey Pronto advised him he doesn't have to wear the abduction pillow. He got behind on his pain medication today and states that today his shoulder has been the most painful he has experienced since surgery. He also noticed swelling in L lateral upper arm this morning. He did take his pain medication at 14:00, before PT evaluation.  He has a history of chronic back pain and underwent a lumbar facet, medial branch radiofrequency ablation on 01/02/22. At the time of the evaluation pt has not yet appreciated improvement in his back pain from this procedure. He has a past medical history of Cardiomyopathy (in setting of Afib), DJD (degenerative joint disease) of knee, History of kidney stones, Intervertebral disc disorder with radiculopathy of lumbosacral region, Kidney stone (2012), Lower extremity edema, PAF (paroxysmal atrial fibrillation) ([place]), Pernicious anemia, Prostate cancer ([place]), Pulmonary nodule, right, and SVT (supraventricular tachycardia) ([place]). He also  has a past surgical history that  includes Knee surgery; Gastric bypass (2002); prostate seeding; Carpal tunnel release (Right, 12/23/2014); Carpal tunnel release (Left, 01/06/2015); Colonoscopy with propofol (N/A, 10/08/2017); and Cardioversion (N/A, 05/15/2019).   Pain:  Pain Intensity: Present: 5/10, Best: 3/10, Worst: 10/10 Pain location: Anterior L shoulder Pain Quality: aching and throbbing Radiating: No  Numbness/Tingling: No Focal Weakness: Yes History of prior shoulder or neck/shoulder injury, pain, surgery, or therapy: Yes, chronic bilateral shoulder pain with R TSR. 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 No AROM or WB through LUE (per protocol), Passive elevation and ER in staged ROM to begin at week 4 (elevation 90 degrees weeks 4-6 with no ER; 0-120 weeks 6-8 with ER 0-30 degrees). No pulleys or cane until after 6 weeks, recommend no driving x 6-8 weeks.   Weight Bearing Restrictions: Yes No WB through LUE for at least 6 weeks  Living Environment Lives with: lives with their spouse Lives in: House/apartment  Patient Goals: Improve L shoulder strength and pain. Pt would like to be able to play with/lift his grandson as well as take care of his property.   OBJECTIVE:   Patient Surveys  FOTO: To be completed QuickDASH: 47.7%  Cognition Patient is oriented to person, place, and time.  Recent memory is intact.  Remote memory is intact.  Attention span and concentration are intact.  Expressive speech is  intact.  Patient's fund of knowledge is within normal limits for educational level.    Gross Musculoskeletal Assessment Tremor: None Bulk: Mild swelling noted in L lateral upper arm. Pt denies chills, fevers, redness, or dischrage from inicisions Tone: Normal  Posture Forward head and rounded shoulders  AROM Full L wrist AROM flexion, extension, radial deviation, and ulnar deviation. MCP flexion and extension WNL. Unable to assess L shoulder and elbow AROM per protocol  PROM Full L elbow  PROM flexion and extension however during first attempt at slow passive extension pt reports sudden pain in L shoulder which eventually improve after a couple minutes of rest. Full L elbow PROM pronation/supination.   LE MMT: Full L wrist strength for flexion, extension, radial deviation, and ulnar deviation. L grip strength is intact and strong. Deferred testing of L elbow and shoulder strength per protocol  Sensation Grossly intact to light touch throughout LUE as determined by testing dermatomes C2-T2. Proprioception and hot/cold testing deferred on this date.  Palpation Pt is generally tender to light palpation around anterior, lateral, and posterior L shoulder;    TODAY'S TREATMENT    SUBJECTIVE: Pt reports he had a good 2 nights sleep since last session.  He overall feels better than he did at last appointment.      PAIN: Continued bilateral shoulder pain, worse in the L shoulder;   TREATMENT   Ther-ex  Updated outcome measures with patient: QuickDASH (L shoulder): 65.91% Worst pain in L shoulder: 10/10; FOTO: 59  All exercises performed in upright positions L shoulder AROM flexion with combination of both bent and straight elbow to around 130 degrees 2 x 10; L elbow AROM flexion 2 x 10; L elbow manually resisted extension 2 x 10; L wrist manually resisted flexion and extension x 10 each; L shoulder isometric IR, ER, flexion, and extension at neutral with forearm vertical 3s  hold x 10 each direction; R shoulder flexion from neutral to 90 degrees with 4# dumbbell 2 x 9; R shoulder scaption from neutral to around 90 degrees x 10; R elbow flexion with 4# dumbbell 2 x 10; R elbow extension with manual resistance 2 x 10; R shoulder internal rotation from forearm vertical to stomach with manual resistance from therapist 2 x 10; R shoulder external rotation from stomach to forearm vertical with 4# DB 2 x 10; Ice pack applied to bilateral shoulders at end of session during review;   PATIENT EDUCATION:  Education details:  Pt educated throughout session about proper posture and technique with exercises. Improved exercise technique, movement at target joints, use of target muscles after min to mod verbal, visual, tactile cues. Person educated: Patient Education method: Explanation Education comprehension: verbalized understanding and returned demonstration   HOME EXERCISE PROGRAM: Access Code: [email or web address] URL: [email or web address] Date: 01/10/2022 Prepared by: Roxana Hires  Exercises - Wrist Flexion AROM  - 2 x daily - 7 x weekly - 2 sets - 10 reps - 3s hold - Wrist Extension AROM  - 2 x daily - 7 x weekly - 2 sets - 10 reps - 3s hold - Seated Scapular Retraction  - 2 x daily - 7 x weekly - 2 sets - 10 reps - 3s hold - Supported Elbow Flexion Extension PROM  - 2 x daily - 7 x weekly - 2 sets - 10 reps - 3s hold - Forearm Supination PROM (Mirrored)  - 2 x daily - 7 x weekly - 2 sets - 10 reps - 3s hold - Forearm Pronation PROM (Mirrored)  - 2 x daily - 7 x weekly - 2 sets - 10 reps - 3s hold - Seated Gripping Towel  - 2 x daily - 7 x weekly - 2 sets - 10 reps - 3s hold   ASSESSMENT:  CLINICAL IMPRESSION: Pt continues making progress with both shoulders today. Updated outcome measures/goals for L shoulder. His FOTO improved from 4 at initial evaluation to 6 today. His worst pain is unchanged and his QuickDASH  is slightly worse. He is able to perform  AROM of L shoulder in all directions against gravity demonstrating at least 3/5 strength. Unable to perform MMT due to protocol restrictions. Pt is able to increase weight in RUE during strengthening today. Continued AAROM for L shoulder today and pt is able to progress into slightly more flexion without pain. Pt encouraged to continue HEP and follow-up as scheduled. Plan to progress LUE per protocol as pt is able to tolerate in addition to progressing resistance with RUE strengthening against gravity. He will benefit from skilled PT to address deficits in range of motion and strength in order to improve overall function at home and with leisure activities such as caring for his property.   Pt able to perform shoulder strengthening on R UE without any report of pain during or after.  Mild L shoulder discomfort noted but, also able to perform L shoulder AROM without significant increase in pain during or after.  Ended with cold pack for pain control.  Pt encouraged to continue with slow progression of HEP, never pushing through pain or resistance. He will benefit from skilled PT to address deficits in range of motion and strength in order to improve overall function at home and with leisure activities such as caring for his property.   REHAB POTENTIAL: Good  CLINICAL DECISION MAKING: Evolving/moderate complexity  EVALUATION COMPLEXITY: Moderate   GOALS: Goals reviewed with patient? Yes  SHORT TERM GOALS: Target date: 02/14/22  Pt will be independent with HEP to improve strength and decrease right shoulder pain to improve pain-free function at home and work. Baseline:  Goal status: ONGOING   LONG TERM GOALS: Target date: 03/28/22  Pt will increase L shoulder FOTO to at least 51 to demonstrate significant improvement in function at home and with leisure activities related to L shoulder. Baseline: 01/03/22: To be completed; 01/24/22: 4; 02/28/22: 59; Goal status: ACHIEVED  2.  Pt will decrease  worst L shoulder pain by at least 3 points on the NPRS in order to demonstrate clinically significant reduction in R shoulder pain. Baseline: 01/03/22: worst: 10/10; 02/28/22: 10/10; Goal status: ONGOING  3.  Pt will decrease quick DASH score by at least 8% in order to demonstrate clinically significant reduction in disability related to L shoulder pain        Baseline: 01/03/22: 47.7%; 02/28/22: 65.91% Goal status: ONGOING  4. Pt will demonstrate L shoulder flexion, abduction, IR, and ER strength of at least 4/5 in order to demonstrate improvement in strength and function       Baseline: 01/03/22: Unable to test per protocol; 02/28/22: At least 3/5 but unable to fully test due to protocol; Goal status: ONGOING  PLAN: PT FREQUENCY: 1-2x/week  PT DURATION: 12 weeks  PLANNED INTERVENTIONS: Therapeutic exercises, Therapeutic activity, Neuromuscular re-education, Balance training, Gait training, Patient/Family education, Joint manipulation, Joint mobilization, Vestibular training, Canalith repositioning, Aquatic Therapy, Dry Needling, Electrical stimulation, Spinal manipulation, Spinal mobilization, Cryotherapy, Moist heat, Traction, Ultrasound, Ionotophoresis '4mg'$ /ml Dexamethasone, and Manual therapy  PLAN FOR NEXT SESSION: Progress L shoulder ROM and strength per protocol, continue R shoulder strengthening   Lyndel Safe Jaidynn Balster PT, DPT, GCS  03/24/2022, 8:44 AM

## 2022-03-24 NOTE — Anesthesia Preprocedure Evaluation (Addendum)
Anesthesia Evaluation  Patient identified by MRN, date of birth, ID band Patient awake    Reviewed: Allergy & Precautions, H&P , NPO status , Patient's Chart, lab work & pertinent test results  History of Anesthesia Complications Negative for: history of anesthetic complications  Airway Mallampati: III  TM Distance: <3 FB Neck ROM: limited    Dental  (+) Chipped, Poor Dentition, Dental Advidsory Given   Pulmonary neg shortness of breath, neg COPD, former smoker          Cardiovascular Exercise Tolerance: Good hypertension, (-) angina + CAD  (-) Past MI + dysrhythmias Atrial Fibrillation  Rhythm:Irregular Rate:Normal     Neuro/Psych Seizures - (2/2 A fib per patient),   Neuromuscular disease  negative psych ROS   GI/Hepatic negative GI ROS, Neg liver ROS,neg GERD  ,,  Endo/Other    Morbid obesity  Renal/GU Renal disease  negative genitourinary   Musculoskeletal  (+) Arthritis ,    Abdominal   Peds  Hematology negative hematology ROS (+)   Anesthesia Other Findings Past Medical History: No date: Cardiomyopathy (in setting of Afib)     Comment:  a. 12/2013 Echo: EF 45-50%, mild ant and antsept HK.               mild MR. Mod dil LA. nl RV fxn. Rhythm was Afib. No date: DJD (degenerative joint disease) of knee No date: History of kidney stones No date: Intervertebral disc disorder with radiculopathy of  lumbosacral region 2012: Kidney stone No date: Lower extremity edema No date: PAF (paroxysmal atrial fibrillation) ([facility]) No date: Pernicious anemia No date: Prostate cancer ([place]) No date: Pulmonary nodule, right     Comment:  a. 10/2014 Cardiac CTA: 95m RLL nodule; b. 04/2015 CT               Chest: stable 712mRLL nodule. No new nodules; 02/2017 CTA               Chest: stable, benign, 53m753mLL pulm nodule. No date: SVT (supraventricular tachycardia) ([place]Past Surgical History: 12/23/2014: CARPAL TUNNEL RELEASE;  Right     Comment:  Procedure: CARPAL TUNNEL RELEASE;  Surgeon: HowEarnestine LeysD;  Location: [facility];  Service: Orthopedics;                Laterality: Right; 01/06/2015: CARPAL TUNNEL RELEASE; Left     Comment:  Procedure: CARPAL TUNNEL RELEASE;  Surgeon: HowEarnestine LeysD;  Location: [facility];  Service: Orthopedics;                Laterality: Left; 10/08/2017: COLONOSCOPY WITH PROPOFOL; N/A     Comment:  Procedure: COLONOSCOPY WITH PROPOFOL;  Surgeon: VanLin LandsmanD;  Location: [facility];  Service:               Gastroenterology;  Laterality: N/A; 2002: GASTRIC BYPASS No date: KNEE SURGERY     Comment:  knee trauma No date: prostate seeding     Reproductive/Obstetrics negative OB ROS                              Anesthesia Physical Anesthesia Plan  ASA: 3  Anesthesia Plan: General  Post-op Pain Management:    Induction: Intravenous  PONV Risk Score and Plan:   Airway Management Planned: Natural Airway and Nasal Cannula  Additional Equipment:   Intra-op Plan:   Post-operative Plan:   Informed Consent: I have reviewed the patients History and Physical, chart, labs and discussed the procedure including the risks, benefits and alternatives for the proposed anesthesia with the patient or authorized representative who has indicated his/her understanding and acceptance.     Dental Advisory Given  Plan Discussed with: Anesthesiologist, CRNA and Surgeon  Anesthesia Plan Comments: (Patient consented for risks of anesthesia including but not limited to:  - adverse reactions to medications - risk of airway placement if required - damage to eyes, teeth, lips or other oral mucosa - nerve damage due to positioning  - sore throat or hoarseness - Damage to heart, brain, nerves, lungs, other parts of body or loss of life  Patient voiced understanding.)         Anesthesia Quick  Evaluation

## 2022-03-24 NOTE — CV Procedure (Signed)
Cardioversion procedure note For atrial fibrillation persistent.  Procedure Details:  Consent: Risks of procedure as well as the alternatives and risks of each were explained to the (patient/caregiver).  Consent for procedure obtained.  Time Out: Verified patient identification, verified procedure, site/side was marked, verified correct patient position, special equipment/implants available, medications/allergies/relevent history reviewed, required imaging and test results available.  Performed  Patient placed on cardiac monitor, pulse oximetry, supplemental oxygen as necessary.   Sedation given: propofol IV, Dr. Danne Baxter Pacer pads placed anterior and posterior chest.   Cardioverted 2 time(s).   Cardioverted at [place], [place].  With chest compression synchronized biphasic Converted to NSR   Evaluation: Findings: Post procedure EKG shows: NSR Complications: None Patient did tolerate procedure well.  Time Spent Directly with the Patient:  43 minutes   Esmond Plants, M.D., Ph.D.

## 2022-03-24 NOTE — Transfer of Care (Signed)
Immediate Anesthesia Transfer of Care Note  Patient: [name]  Procedure(s) Performed: CARDIOVERSION  Patient Location: Short Stay  Anesthesia Type:General  Level of Consciousness: awake, alert , and oriented  Airway & Oxygen Therapy: Patient Spontanous Breathing and Patient connected to nasal cannula oxygen  Post-op Assessment: Report given to RN and Post -op Vital signs reviewed and stable  Post vital signs: Reviewed and stable  Last Vitals:  Vitals Value Taken Time  BP 124/86 03/24/22 0742  Temp    Pulse 76 03/24/22 0742  Resp 15 03/24/22 0741  SpO2 90 % 03/24/22 0742    Last Pain:  Vitals:   03/24/22 0714  TempSrc: Oral  PainSc: 0-No pain         Complications: No notable events documented.
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
## 2022-03-27 ENCOUNTER — Encounter: Payer: Self-pay | Admitting: Cardiovascular Disease

## 2022-03-28 ENCOUNTER — Ambulatory Visit: Payer: Medicare Other

## 2022-03-28 DIAGNOSIS — G8929 Other chronic pain: Secondary | ICD-10-CM | POA: Diagnosis not present

## 2022-03-28 DIAGNOSIS — M25512 Pain in left shoulder: Secondary | ICD-10-CM | POA: Diagnosis not present

## 2022-03-28 DIAGNOSIS — M6281 Muscle weakness (generalized): Secondary | ICD-10-CM

## 2022-03-28 DIAGNOSIS — M25511 Pain in right shoulder: Secondary | ICD-10-CM | POA: Diagnosis not present

## 2022-03-29 DIAGNOSIS — E559 Vitamin D deficiency, unspecified: Secondary | ICD-10-CM | POA: Diagnosis not present

## 2022-03-29 DIAGNOSIS — Z9884 Bariatric surgery status: Secondary | ICD-10-CM | POA: Diagnosis not present

## 2022-03-29 DIAGNOSIS — K4090 Unilateral inguinal hernia, without obstruction or gangrene, not specified as recurrent: Secondary | ICD-10-CM | POA: Diagnosis not present

## 2022-03-30 ENCOUNTER — Ambulatory Visit
Payer: Medicare Other | Attending: Student in an Organized Health Care Education/Training Program | Admitting: Student in an Organized Health Care Education/Training Program

## 2022-03-30 ENCOUNTER — Encounter: Payer: Self-pay | Admitting: Student in an Organized Health Care Education/Training Program

## 2022-03-30 ENCOUNTER — Ambulatory Visit: Payer: Medicare Other

## 2022-03-30 VITALS — BP 140/93 | HR 72 | Temp 96.4°F | Resp 18 | Ht 72.0 in | Wt 333.0 lb

## 2022-03-30 DIAGNOSIS — M47816 Spondylosis without myelopathy or radiculopathy, lumbar region: Secondary | ICD-10-CM | POA: Diagnosis not present

## 2022-03-30 DIAGNOSIS — G8929 Other chronic pain: Secondary | ICD-10-CM | POA: Insufficient documentation

## 2022-03-30 DIAGNOSIS — M6281 Muscle weakness (generalized): Secondary | ICD-10-CM

## 2022-03-30 DIAGNOSIS — M170 Bilateral primary osteoarthritis of knee: Secondary | ICD-10-CM | POA: Diagnosis not present

## 2022-03-30 DIAGNOSIS — M25512 Pain in left shoulder: Secondary | ICD-10-CM | POA: Diagnosis not present

## 2022-03-30 DIAGNOSIS — M25561 Pain in right knee: Secondary | ICD-10-CM | POA: Diagnosis not present

## 2022-03-30 DIAGNOSIS — M19011 Primary osteoarthritis, right shoulder: Secondary | ICD-10-CM | POA: Insufficient documentation

## 2022-03-30 DIAGNOSIS — M25511 Pain in right shoulder: Secondary | ICD-10-CM | POA: Diagnosis not present

## 2022-03-30 DIAGNOSIS — M19012 Primary osteoarthritis, left shoulder: Secondary | ICD-10-CM | POA: Insufficient documentation

## 2022-03-30 DIAGNOSIS — G894 Chronic pain syndrome: Secondary | ICD-10-CM | POA: Diagnosis not present

## 2022-03-30 DIAGNOSIS — M25562 Pain in left knee: Secondary | ICD-10-CM | POA: Diagnosis not present

## 2022-03-30 MED ORDER — HYDROCODONE-ACETAMINOPHEN 7.5-325 MG PO TABS: 1.0000 | ORAL_TABLET | Freq: Four times a day (QID) | ORAL | 0 refills | Status: AC | PRN

## 2022-03-30 MED ORDER — HYDROCODONE-ACETAMINOPHEN 7.5-325 MG PO TABS: 1.0000 | ORAL_TABLET | Freq: Four times a day (QID) | ORAL | 0 refills | Status: DC | PRN

## 2022-03-30 NOTE — Patient Instructions (Addendum)
Will need cardiac clearance to d/c Xarelto 3 days prior to lumbar RFA (IF lumbar RFA approved), decrease ASA 325 to 81 mg 7 days prior to RFA (ONLY IF RFA approved)  Pt verb u/o

## 2022-03-30 NOTE — Progress Notes (Signed)
Nursing Pain Medication Assessment:  Safety precautions to be maintained throughout the outpatient stay will include: orient to surroundings, keep bed in low position, maintain call bell within reach at all times, provide assistance with transfer out of bed and ambulation.  Medication Inspection Compliance: Pill count conducted under aseptic conditions, in front of the patient. Neither the pills nor the bottle was removed from the patient's sight at any time. Once count was completed pills were immediately returned to the patient in their original bottle.  Medication: Hydrocodone/APAP Pill/Patch Count:  76 of 120 pills remain Pill/Patch Appearance: Markings consistent with prescribed medication Bottle Appearance: Standard pharmacy container. Clearly labeled. Filled Date: 03 / 05 / 2024 Last Medication intake:  Today

## 2022-03-30 NOTE — Progress Notes (Signed)
PROVIDER NOTE: Information contained herein reflects review and annotations entered in association with encounter. Interpretation of such information and data should be left to medically-trained personnel. Information provided to patient can be located elsewhere in the medical record under "Patient Instructions". Document created using STT-dictation technology, any transcriptional errors that may result from process are unintentional.    Patient: [name]  Service Category: E/M  Provider: Gillis Santa, MD  DOB: [birth year]  DOS: 03/30/2022  Specialty: Interventional Pain Management  MRN: [id number]  Setting: Ambulatory outpatient  PCP: Olin Hauser, DO  Type: Established Patient    Referring Provider: Nobie Putnam *  Location: Office  Delivery: Face-to-face     HPI  [name], a 67 y.o. year old male, is here today because of his Chronic pain of both knees [M25.561, M25.562, G89.29]. [name] primary complain today is Back Pain, Foot Pain, Wrist Pain, Shoulder Pain (bilateral), and Knee Pain (bilateral) Last encounter: My last encounter with him was on 03/08/22 Pertinent problems: [name] has H/O gastric bypass; DJD (degenerative joint disease) of knee; Bilateral carpal tunnel syndrome; Persistent atrial fibrillation ([place]); Chronic radicular lumbar pain; Lumbar radiculopathy; Lumbar degenerative disc disease; Lumbar facet arthropathy; Lumbar facet joint syndrome; Primary osteoarthritis of both wrists; Trigger middle finger of left hand; and Chronic pain syndrome on their pertinent problem list. Pain Assessment: Severity of Chronic pain is reported as a 2 /10. Location: Back Lower/ . Onset: More than a month ago. Quality: Larence Penning. Timing: Constant. Modifying factor(s): meds. Vitals:  height is 6' (1.829 m) and weight is 333 lb (151 kg) (abnormal). His temperature is 96.4 F (35.8 C) (abnormal). His blood pressure is 140/93 (abnormal) and his pulse is 72. His  respiration is 18 and oxygen saturation is 97%.   Reason for encounter: both, medication management and post-procedure evaluation and assessment    Post-procedure evaluation  Type: Therapeutic Wrist Steroid Injection  #3  Region: Dorsal Scaphoid Level: Wrist Laterality: Bilateral     Effectiveness:  Initial hour after procedure: 100 %  Subsequent 4-6 hours post-procedure: 100 %  Analgesia past initial 6 hours: 80 % (ongoing)  Ongoing improvement:  Analgesic:  80% Function: Somewhat improved ROM: Somewhat improved     Pharmacotherapy Assessment  Analgesic:  hydrocodone 7.5 mg 3 times daily as needed     Monitoring: [place] PMP: PDMP reviewed during this encounter.       Pharmacotherapy: No side-effects or adverse reactions reported. Compliance: No problems identified. Effectiveness: Clinically acceptable.  Wayne Shorter, RN  03/30/2022  9:50 AM  Sign when Signing Visit Nursing Pain Medication Assessment:  Safety precautions to be maintained throughout the outpatient stay will include: orient to surroundings, keep bed in low position, maintain call bell within reach at all times, provide assistance with transfer out of bed and ambulation.  Medication Inspection Compliance: Pill count conducted under aseptic conditions, in front of the patient. Neither the pills nor the bottle was removed from the patient's sight at any time. Once count was completed pills were immediately returned to the patient in their original bottle.  Medication: Hydrocodone/APAP Pill/Patch Count:  76 of 120 pills remain Pill/Patch Appearance: Markings consistent with prescribed medication Bottle Appearance: Standard pharmacy container. Clearly labeled. Filled Date: 03 / 05 / 2024 Last Medication intake:  Today     UDS:  Summary  Date Value Ref Range Status  08/23/2021 Note  Final    Comment:    ==================================================================== [facility]  (MW) ====================================================================  Test                             Result       Flag       Units  Drug Present and Declared for Prescription Verification   Hydrocodone                    4372         EXPECTED   ng/mg creat   Hydromorphone                  98           EXPECTED   ng/mg creat   Dihydrocodeine                 478          EXPECTED   ng/mg creat   Norhydrocodone                 >2488        EXPECTED   ng/mg creat    Sources of hydrocodone include scheduled prescription medications.    Hydromorphone, dihydrocodeine and norhydrocodone are expected    metabolites of hydrocodone. Hydromorphone and dihydrocodeine are    also available as scheduled prescription medications.  ==================================================================== Test                      Result    Flag   Units      Ref Range   Creatinine              201              mg/dL      >=20 ==================================================================== Declared Medications:  The flagging and interpretation on this report are based on the  following declared medications.  Unexpected results may arise from  inaccuracies in the declared medications.   **Note: The testing scope of this panel includes these medications:   Hydrocodone (Norco)   **Note: The testing scope of this panel does not include the  following reported medications:   Acetaminophen (Norco)  Amiodarone (Pacerone)  Amlodipine (Norvasc)  Aspirin  Benazepril (Lotensin)  Buspirone (Buspar)  Cyclobenzaprine (Flexeril)  Duloxetine (Cymbalta)  Eye Drops  Furosemide (Lasix)  Melatonin  Metoprolol (Toprol)  Multivitamin  Rivaroxaban (Xarelto)  Supplement  Tamsulosin (Flomax)  Trazodone (Desyrel)  Vitamin B12 ==================================================================== For clinical consultation, please call [phone]. ====================================================================      ROS  Constitutional: Denies any fever or chills Gastrointestinal: No reported hemesis, hematochezia, vomiting, or acute GI distress Musculoskeletal:  Low back pain, bilateral knee pain Neurological: No reported episodes of acute onset apraxia, aphasia, dysarthria, agnosia, amnesia, paralysis, loss of coordination, or loss of consciousness  Medication Review  DULoxetine, HYDROcodone-acetaminophen, Vitamin D (Ergocalciferol), amiodarone, aspirin EC, benazepril, busPIRone, carboxymethylcellulose, cyanocobalamin, cyclobenzaprine, melatonin, metoprolol succinate, multivitamin with minerals, rivaroxaban, and traZODone  History Review  Allergy: Mr. Montgomery is allergic to bupivacaine liposome. Drug: Mr. Bayles  reports no history of drug use. Alcohol:  reports that he does not currently use alcohol. Tobacco:  reports that he quit smoking about 43 years ago. His smoking use included cigarettes. He has a 10.00 pack-year smoking history. He has quit using smokeless tobacco.  His smokeless tobacco use included snuff. Social: Mr. Kirschman  reports that he quit smoking about 43 years ago. His smoking use included cigarettes. He has a 10.00 pack-year smoking history. He  has quit using smokeless tobacco.  His smokeless tobacco use included snuff. He reports that he does not currently use alcohol. He reports that he does not use drugs. Medical:  has a past medical history of Anxiety, Aortic atherosclerosis ([place]), Cardiomyopathy (in setting of Afib), Chronic pain syndrome, Chronic, continuous use of opioids, Coronary artery disease (11/06/2014), Diverticulosis, DJD (degenerative joint disease) of knee, History of hiatal hernia, History of kidney stones (2012), Hyperlipidemia, Hyperplastic colon polyp, Hypertension, Internal hemorrhoids, Intervertebral disc disorder with radiculopathy of lumbosacral region, Long term current use FULL  DOSE (325 mg) aspirin, Long term current use of amiodarone, Long term current use of antithrombotics/antiplatelets, Lower extremity edema, Mixed hyperlipidemia, Myalgia due to statin, Osteoarthritis, PAF (paroxysmal atrial fibrillation) ([place]), Pernicious anemia, Prostate cancer ([place]), Pulmonary nodule, right, Sleep difficulties, SVT (supraventricular tachycardia), and Tubular adenoma of colon. Surgical: Mr. Rairdon  has a past surgical history that includes Knee surgery (Right); Gastric bypass (01/17/2000); prostate seeding; Carpal tunnel release (Right, 12/23/2014); Carpal tunnel release (Left, 01/06/2015); Colonoscopy with propofol (N/A, 10/08/2017); Cardioversion (N/A, 05/15/2019); Cardioversion (N/A, 03/13/2014); Reverse shoulder arthroplasty (Right, 09/27/2021); Bicept tenodesis (Right, 09/27/2021); Shoulder arthroscopy with rotator cuff repair and open biceps tenodesis (Left, 12/27/2021); and Cardioversion (N/A, 03/24/2022). Family: family history includes Arrhythmia in his brother and sister; Cancer in his father; Heart disease in his father; Hypertension in his father; Stroke in his father.  Laboratory Chemistry Profile   Renal Lab Results  Component Value Date   BUN 14 03/22/2022   CREATININE 0.97 [id number]   BCR NOT APPLICABLE [id number]   GFRAA 72 07/21/2020   GFRNONAA >60 03/22/2022    Hepatic Lab Results  Component Value Date   AST 12 07/27/2021   ALT 10 07/27/2021   ALBUMIN 4.6 08/01/2019   ALKPHOS 72 08/01/2019   LIPASE 28 03/13/2017    Electrolytes Lab Results  Component Value Date   NA 139 03/22/2022   K 4.4 03/22/2022   CL 109 03/22/2022   CALCIUM 8.7 (L) 03/22/2022   MG 2.0 02/01/2021    Bone Lab Results  Component Value Date   25OHVITD1 32 02/01/2021   25OHVITD2 <1.0 02/01/2021   25OHVITD3 32 02/01/2021    Inflammation (CRP: Acute Phase) (ESR: Chronic Phase) Lab Results  Component Value Date   CRP 1.7 02/09/2022   ESRSEDRATE 6 02/09/2022         Note:  Above Lab results reviewed.    Physical Exam  General appearance: Well nourished, well developed, and well hydrated. In no apparent acute distress Mental status: Alert, oriented x 3 (person, place, & time)       Respiratory: No evidence of acute respiratory distress Eyes: PERLA Vitals: BP (!) 140/93   Pulse 72   Temp (!) 96.4 F (35.8 C)   Resp 18   Ht 6' (1.829 m)   Wt (!) 333 lb (151 kg)   SpO2 97%   BMI 45.16 kg/m  BMI: Estimated body mass index is 45.16 kg/m as calculated from the following:   Height as of this encounter: 6' (1.829 m).   Weight as of this encounter: 333 lb (151 kg). Ideal: Ideal body weight: 77.6 kg (171 lb 1.2 oz) Adjusted ideal body weight: 107 kg (235 lb 13.5 oz)  Right wrist pain worse with wrist extension  Lumbar Spine Area Exam  Skin & Axial Inspection: No masses, redness, or swelling Alignment: Symmetrical Functional ROM: Pain restricted ROM       Stability: No instability detected Muscle Tone/Strength: Functionally intact. No obvious  neuro-muscular anomalies detected. Sensory (Neurological): Musculoskeletal pain pattern, related to lumbar facets, positive pain with lumbar extension and facet loading  Gait & Posture Assessment  Ambulation: Unassisted Gait: Relatively normal for age and body habitus Posture: WNL  Lower Extremity Exam      Side: Right lower extremity   Side: Left lower extremity  Stability: No instability observed           Stability: No instability observed          Skin & Extremity Inspection: Skin color, temperature, and hair growth are WNL. No peripheral edema or cyanosis. No masses, redness, swelling, asymmetry, or associated skin lesions. No contractures.   Skin & Extremity Inspection: Skin color, temperature, and hair growth are WNL. No peripheral edema or cyanosis. No masses, redness, swelling, asymmetry, or associated skin lesions. No contractures.  Functional ROM: Pain restricted ROM for hip and knee joints            Functional ROM: Pain restricted ROM for hip and knee joints          Muscle Tone/Strength: Functionally intact. No obvious neuro-muscular anomalies detected.   Muscle Tone/Strength: Functionally intact. No obvious neuro-muscular anomalies detected.  Sensory (Neurological): Arthropathic        Sensory (Neurological): Arthropathic       DTR: Patellar: deferred today Achilles: deferred today Plantar: deferred today   DTR: Patellar: deferred today Achilles: deferred today Plantar: deferred today  Palpation: No palpable anomalies   Palpation: No palpable anomalies     Assessment   Status Diagnosis  Controlled Controlled Controlled 1. Chronic pain of both knees   2. Bilateral primary osteoarthritis of knee   3. Lumbar facet joint syndrome   4. Lumbar facet arthropathy   5. Chronic pain syndrome   6. Localized osteoarthritis of shoulder regions, bilateral        Plan of Care   1. Chronic pain of both knees - KNEE INJECTION; Future  2. Bilateral primary osteoarthritis of knee - KNEE INJECTION; Future  3. Lumbar facet joint syndrome - Radiofrequency,Lumbar; Future  4. Lumbar facet arthropathy - Radiofrequency,Lumbar; Future  5. Chronic pain syndrome - KNEE INJECTION; Future - Radiofrequency,Lumbar; Future - HYDROcodone-acetaminophen (NORCO) 7.5-325 MG tablet; Take 1 tablet by mouth every 6 (six) hours as needed for severe pain. Must last 30 days  Dispense: 120 tablet; Refill: 0 - HYDROcodone-acetaminophen (NORCO) 7.5-325 MG tablet; Take 1 tablet by mouth every 6 (six) hours as needed for severe pain. Must last 30 days  Dispense: 120 tablet; Refill: 0 - HYDROcodone-acetaminophen (NORCO) 7.5-325 MG tablet; Take 1 tablet by mouth every 6 (six) hours as needed for severe pain. Must last 30 days  Dispense: 120 tablet; Refill: 0  6. Localized osteoarthritis of shoulder regions, bilateral - HYDROcodone-acetaminophen (NORCO) 7.5-325 MG tablet; Take 1 tablet by mouth every 6 (six)  hours as needed for severe pain. Must last 30 days  Dispense: 120 tablet; Refill: 0 - HYDROcodone-acetaminophen (NORCO) 7.5-325 MG tablet; Take 1 tablet by mouth every 6 (six) hours as needed for severe pain. Must last 30 days  Dispense: 120 tablet; Refill: 0 - HYDROcodone-acetaminophen (NORCO) 7.5-325 MG tablet; Take 1 tablet by mouth every 6 (six) hours as needed for severe pain. Must last 30 days  Dispense: 120 tablet; Refill: 0  Increased low back pain related to lumbar facet arthropathy, patient states that he would like to repeat lumbar radiofrequency ablation which helped provide 75 to 80% pain relief for his axial low back.  I informed  him that we will need to stop Xarelto 3 days prior to his scheduled procedure and that he would also need to decrease his 325 mg of aspirin to 81 mg 7 days prior.  We will obtain cardiac clearance for this.  We will also repeat bilateral intra-articular Hyalgan injections which she had done in May 2022 that provided him with significant pain relief for greater than 18 months.  Future considerations could also include genicular nerve block and possible RFA  Pharmacotherapy (Medications Ordered): Meds ordered this encounter  Medications   HYDROcodone-acetaminophen (NORCO) 7.5-325 MG tablet    Sig: Take 1 tablet by mouth every 6 (six) hours as needed for severe pain. Must last 30 days    Dispense:  120 tablet    Refill:  0    Chronic Pain: STOP Act (Not applicable) Fill 1 day early if closed on refill date. Avoid benzodiazepines within 8 hours of opioids   HYDROcodone-acetaminophen (NORCO) 7.5-325 MG tablet    Sig: Take 1 tablet by mouth every 6 (six) hours as needed for severe pain. Must last 30 days    Dispense:  120 tablet    Refill:  0    Chronic Pain: STOP Act (Not applicable) Fill 1 day early if closed on refill date. Avoid benzodiazepines within 8 hours of opioids   HYDROcodone-acetaminophen (NORCO) 7.5-325 MG tablet    Sig: Take 1 tablet by mouth  every 6 (six) hours as needed for severe pain. Must last 30 days    Dispense:  120 tablet    Refill:  0    Chronic Pain: STOP Act (Not applicable) Fill 1 day early if closed on refill date. Avoid benzodiazepines within 8 hours of opioids   Orders Placed This Encounter  Procedures   KNEE INJECTION    Indications: Knee arthralgia (pain) due to osteoarthritis (OA) Imaging: None ([id number]) Position: Sitting Equipment/Materials: Block tray  1.5", 25-G (one per side)  Local anesthetic  Hyalgan (one per side)    Standing Status:   Future    Standing Expiration Date:   03/30/2023    Scheduling Instructions:     Procedure: Knee injection Hyalgan (Hyaluronan/Hyaluronic acid)       Level: Intra-articular     Laterality: Bilateral Knee     Sedation: Patient's choice.    Order Specific Question:   Where will this procedure be performed?    Answer:   [facility]   Radiofrequency,Lumbar    Standing Status:   Future    Standing Expiration Date:   06/30/2022    Scheduling Instructions:     Side(s): Bilateral     Level(s):L3, L4, L5, Medial Branch Nerve(s)     Sedation: With Sedation 10 mg PO Valium     Scheduling Timeframe: As soon as pre-approved    Order Specific Question:   Where will this procedure be performed?    Answer:   [facility]      Follow-up plan:   Return in about 2 weeks (around 04/13/2022) for B/L knee [place] and Left L3, 4, 5 RFA , in clinic (PO Valium).     Recent Visits Date Type Provider Dept  03/08/22 Procedure visit Gillis Santa, MD [facility]  02/07/22 Office Visit Gillis Santa, MD [facility]  01/30/22 Office Visit Gillis Santa, MD [facility]  01/02/22 Procedure visit Gillis Santa, MD [facility]  Showing recent visits within past 90 days and meeting all other requirements Today's Visits Date Type Provider  Dept  03/30/22 Office Visit Gillis Santa, MD [facility]  Showing today's  visits and meeting all other requirements Future Appointments No visits were found meeting these conditions. Showing future appointments within next 90 days and meeting all other requirements  I discussed the assessment and treatment plan with the patient. The patient was provided an opportunity to ask questions and all were answered. The patient agreed with the plan and demonstrated an understanding of the instructions.  Patient advised to call back or seek an in-person evaluation if the symptoms or condition worsens.  Duration of encounter: 30 minutes.  Note by: Gillis Santa, MD Date: 03/30/2022; Time: 11:03 AM

## 2022-03-30 NOTE — Therapy (Signed)
OUTPATIENT PHYSICAL THERAPY SHOULDER TREATMENT  Patient Name: [name] MRN: [id number] DOB:13-Feb-1955, 67 y.o., male Today's Date: 04/02/2022   PT End of Session - 04/02/22 2057     Visit Number 18    Number of Visits 49    Date for PT Re-Evaluation 06/20/22    Authorization Type [employer] Medicare    Authorization Time Period UHC medicare 2024  PN:[facility] on [place]    PT Start Time 1400    PT Stop Time 1445    PT Time Calculation (min) 45 min    Activity Tolerance Patient tolerated treatment well    Behavior During Therapy WFL for tasks assessed/performed            Past Medical History:  Diagnosis Date   Anxiety    Aortic atherosclerosis ([place])    Cardiomyopathy (in setting of Afib)    a.) TTE 12/26/2013: EF 45-50%, mild ant and antsept HK. mild MR. Mod dil LA. nl RV fxn. Rhythm was Afib; b.) TTE 07/04/2019: EF 55%, mid-apical anteroseptal HK, mild MAC, mild Ao sclerosis, G2DD, RVSP 45.3   Chronic pain syndrome    a.) followed by pain management   Chronic, continuous use of opioids    a.) hydrocodone/APAP 7.5/325 mg; followed by pain management   Coronary artery disease 11/06/2014   a.) cCTA 11/06/2014: Ca score 224 (all in pLAD) -- 74th percentile for age/sex matched control   Diverticulosis    DJD (degenerative joint disease) of knee    History of hiatal hernia    History of kidney stones 2012   Hyperlipidemia    Hyperplastic colon polyp    Hypertension    Internal hemorrhoids    Intervertebral disc disorder with radiculopathy of lumbosacral region    Long term current use FULL DOSE (325 mg) aspirin    Long term current use of amiodarone    Long term current use of antithrombotics/antiplatelets    a.) rivaroxaban   Lower extremity edema    Mixed hyperlipidemia    Myalgia due to statin    Osteoarthritis    PAF (paroxysmal atrial fibrillation) ([place])    a.) CHA2DS2VASc = 4 (age, HTN, CHF, vascular disease history);  b.) s/p DCCV 03/13/2014 (200 J x1); c.) s/p DCCV  05/15/2019 (150 J x 1, 200 J x2); d.) rate/rhythm maintained on oral amiodarone + metoprolol succinate; chronically anticoagulated with rivaroxaban   Pernicious anemia    Prostate cancer ([place])    Pulmonary nodule, right    a. 10/2014 Cardiac CTA: 59mm RLL nodule; b. 04/2015 CT Chest: stable 56mm RLL nodule. No new nodules; 02/2017 CTA Chest: stable, benign, 54mm RLL pulm nodule.   Sleep difficulties    a.) takes melatonin + trazodone PRN   SVT (supraventricular tachycardia)    Tubular adenoma of colon    Past Surgical History:  Procedure Laterality Date   BICEPT TENODESIS Right 09/27/2021   Procedure: Right reverse shoulder arthroplasty, biceps tenodesis;  Surgeon: Leim Fabry, MD;  Location: [facility];  Service: Orthopedics;  Laterality: Right;   CARDIOVERSION N/A 05/15/2019   Procedure: CARDIOVERSION;  Surgeon: Minna Merritts, MD;  Location: [facility];  Service: Cardiovascular;  Laterality: N/A;   CARDIOVERSION N/A 03/13/2014   Procedure: CARDIOVERSION; Location: [facility]; Surgeon: Ida Rogue, MD   CARDIOVERSION N/A 03/24/2022   Procedure: CARDIOVERSION;  Surgeon: Minna Merritts, MD;  Location: [facility];  Service: Cardiovascular;  Laterality: N/A;   CARPAL TUNNEL RELEASE Right 12/23/2014   Procedure: CARPAL TUNNEL RELEASE;  Surgeon: Nadara Mustard  Sabra Heck, MD;  Location: [facility];  Service: Orthopedics;  Laterality: Right;   CARPAL TUNNEL RELEASE Left 01/06/2015   Procedure: CARPAL TUNNEL RELEASE;  Surgeon: Earnestine Leys, MD;  Location: [facility];  Service: Orthopedics;  Laterality: Left;   COLONOSCOPY WITH PROPOFOL N/A 10/08/2017   Procedure: COLONOSCOPY WITH PROPOFOL;  Surgeon: Lin Landsman, MD;  Location: [facility];  Service: Gastroenterology;  Laterality: N/A;   GASTRIC BYPASS  01/17/2000   KNEE SURGERY Right    knee trauma x3   prostate seeding     REVERSE SHOULDER ARTHROPLASTY Right 09/27/2021   Procedure: Right reverse shoulder arthroplasty, biceps tenodesis;  Surgeon: Leim Fabry, MD;  Location: [facility];  Service: Orthopedics;  Laterality: Right;   SHOULDER ARTHROSCOPY WITH ROTATOR CUFF REPAIR AND OPEN BICEPS TENODESIS Left 12/27/2021   Procedure: Left shoulder arthroscopic cuff repair (supraspinatus and subscapularis) with Regeneten Patch application;  Surgeon: Leim Fabry, MD;  Location: [facility];  Service: Orthopedics;  Laterality: Left;   Patient Active Problem List   Diagnosis Date Noted   Chronic pain of both knees 03/30/2022   Bilateral primary osteoarthritis of knee 03/30/2022   Shortness of breath 03/24/2022   S/p reverse total shoulder arthroplasty 09/27/2021   Lesion of bone of lumbosacral spine (L5) 05/12/2021   Localized osteoarthritis of shoulder regions, bilateral 03/29/2021   Chronic pain of both shoulders 03/29/2021   Drug-induced myopathy 02/21/2021   Trigger finger of right hand 11/15/2020   Prostate cancer ([place]) 07/26/2020   Insomnia 08/01/2019   Primary osteoarthritis of both wrists 02/17/2019   Trigger middle finger of left hand 02/17/2019   Chronic pain syndrome 02/17/2019   Chronic radicular lumbar pain 10/08/2018   Lumbar radiculopathy 10/08/2018   Lumbar degenerative disc disease 10/08/2018   Lumbar facet arthropathy 10/08/2018   Lumbar facet joint syndrome 10/08/2018   Intervertebral disc disorder with radiculopathy of lumbosacral region    Osteoarthritis of knee 11/30/2017   Osteoarthritis of wrist 11/30/2017   Pernicious anemia 05/22/2016   Persistent atrial fibrillation ([place]) 12/20/2015   Carpal tunnel syndrome 11/16/2014   DJD (degenerative joint disease) of knee 10/27/2014   Bilateral carpal tunnel syndrome 10/27/2014   Morbid obesity with BMI of 50.0-59.9, adult ([place]) 10/27/2014   Mixed hyperlipidemia 10/27/2014   H/O gastric bypass 03/20/2014   Hyperkalemia 03/20/2014   History of prostate cancer 12/26/2013   Essential hypertension 12/26/2013   Encounter for anticoagulation discussion and counseling 12/26/2013    PCP: Olin Hauser, DO  REFERRING PROVIDER: Leim Fabry, MD  REFERRING DIAGNOSIS: Incomplete tear of left rotator cuff, unspecified whether traumatic M75.112   THERAPY DIAG: Chronic left shoulder pain  Chronic right shoulder pain  Muscle weakness (generalized)  RATIONALE FOR EVALUATION AND TREATMENT: Rehabilitation  ONSET DATE: Surgery 12/27/21, Chronic L shoulder pain for multiple years  FOLLOW UP APPT WITH PROVIDER: Yes    FROM INITIAL EVALUATION SUBJECTIVE:  Chief Complaint: S/p L shoulder RTC repair, biceps tenodesis, and shoulder debridement  Pertinent History Pt with history of chronic bilateral shoulder pain. He underwent right reverse TSR and right biceps tenodesis on 09/27/21. He experienced post-operative instability in R shoulder however it improved and he was able to avoid a revision. On 12/27/21 he underwent left arthroscopic rotator cuff repair (subscapularis (full-thickness), supraspinatus (partial-thickness) side-to-side repair + Regeneten patch), left arthroscopic biceps tenodesis, and left extensive debridement of shoulder (glenohumeral and subacromial spaces). Per surgical note other intraoperative findings include Type 2 SLAP tear, degenerative anterior labral tearing, grade 2 degenerative changes to articular cartilage of humeral head, and significant posterior capsule synovitis. No significant post-operative complications. He arrives without abduction pillow and states that Dr. Posey Pronto advised him he doesn't have to wear the abduction pillow. He got behind on his pain medication today and states that today his shoulder has been the most painful he has experienced since surgery. He also noticed swelling in L lateral upper arm this morning. He did take his pain medication at  14:00, before PT evaluation.  He has a history of chronic back pain and underwent a lumbar facet, medial branch radiofrequency ablation on 01/02/22. At the time of the evaluation pt has not yet appreciated improvement in his back pain from this procedure. He has a past medical history of Cardiomyopathy (in setting of Afib), DJD (degenerative joint disease) of knee, History of kidney stones, Intervertebral disc disorder with radiculopathy of lumbosacral region, Kidney stone (2012), Lower extremity edema, PAF (paroxysmal atrial fibrillation) ([place]), Pernicious anemia, Prostate cancer ([place]), Pulmonary nodule, right, and SVT (supraventricular tachycardia) ([place]). He also  has a past surgical history that includes Knee surgery; Gastric bypass (2002); prostate seeding; Carpal tunnel release (Right, 12/23/2014); Carpal tunnel release (Left, 01/06/2015); Colonoscopy with propofol (N/A, 10/08/2017); and Cardioversion (N/A, 05/15/2019).   Pain:  Pain Intensity: Present: 5/10, Best: 3/10, Worst: 10/10 Pain location: Anterior L shoulder Pain Quality: aching and throbbing Radiating: No  Numbness/Tingling: No Focal Weakness: Yes History of prior shoulder or neck/shoulder injury, pain, surgery, or therapy: Yes, chronic bilateral shoulder pain with R TSR. 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 No AROM or WB through LUE (per protocol), Passive elevation and ER in staged ROM to begin at week 4 (elevation 90 degrees weeks 4-6  with no ER; 0-120 weeks 6-8 with ER 0-30 degrees). No pulleys or cane until after 6 weeks, recommend no driving x 6-8 weeks.   Weight Bearing Restrictions: Yes No WB through LUE for at least 6 weeks  Living Environment Lives with: lives with their spouse Lives in: House/apartment  Patient Goals: Improve L shoulder strength and pain. Pt would like to be able to play with/lift his grandson as well as take care of his property.   OBJECTIVE:   Patient Surveys  FOTO: To be completed QuickDASH: 47.7%  Cognition Patient is oriented to person, place, and time.  Recent memory is intact.  Remote memory is intact.  Attention span and concentration are intact.  Expressive speech is intact.  Patient's fund of knowledge is within normal limits for educational  level.    Gross Musculoskeletal Assessment Tremor: None Bulk: Mild swelling noted in L lateral upper arm. Pt denies chills, fevers, redness, or dischrage from inicisions Tone: Normal  Posture Forward head and rounded shoulders  AROM Full L wrist AROM flexion, extension, radial deviation, and ulnar deviation. MCP flexion and extension WNL. Unable to assess L shoulder and elbow AROM per protocol  PROM Full L elbow PROM flexion and extension however during first attempt at slow passive extension pt reports sudden pain in L shoulder which eventually improve after a couple minutes of rest. Full L elbow PROM pronation/supination.   LE MMT: Full L wrist strength for flexion, extension, radial deviation, and ulnar deviation. L grip strength is intact and strong. Deferred testing of L elbow and shoulder strength per protocol  Sensation Grossly intact to light touch throughout LUE as determined by testing dermatomes C2-T2. Proprioception and hot/cold testing deferred on this date.  Palpation Pt is generally tender to light palpation around anterior, lateral, and posterior L shoulder;    TODAY'S TREATMENT    SUBJECTIVE: Pt reports he  is having a lot of shoulder pain today. His L shoulder pain is significantly worse than the right. He drove down to [facility] and by the time he was driving back his shoulders were hurting so bad he was having difficulty holding the steering wheel. He took pain medication around 1:00 but it has not improved his pain. Cardioversion was successful and he is now in NSR. No specific questions currently.   PAIN: Continued bilateral shoulder pain, worse in the L shoulder;   TREATMENT  Ther-ex All exercises performed in partially reclined position unless otherwise specified: L shoulder AROM flexion with combination of both bent and straight elbow to around 130 degrees 2 x 10; L shoulder AROM scaption with therapist supporting weight of arm x 5, discontinued due to increasing pain; L elbow AROM flexion 2 x 10; L elbow manually resisted extension 2 x 10; L shoulder isometric IR 3s hold 2 x 10,  L shoulder ER from stomach to vertical against gravity 3s hold 2 x 10; R shoulder flexion from neutral to 90 degrees with 4# dumbbell 2 x 10; R shoulder scaption from neutral to around 90 degrees 2 x 10; R elbow flexion with 4# dumbbell 2 x 10; R elbow extension with manual resistance 2 x 10; R shoulder internal rotation from forearm vertical to stomach with manual resistance from therapist 2 x 10; R shoulder external rotation from stomach to forearm vertical with 4# DB 2 x 10; R shoulder serratus punch with 4# DB 2 x 10; R chest press with 4# DB 2 x 10; R shoulder circles at 100 degrees flexion with 4# DB CW/CCW 2 x 10 each direction; Ice pack applied to bilateral shoulders at end of session during education;   PATIENT EDUCATION:  Education details:  Pt educated throughout session about proper posture and technique with exercises. Improved exercise technique, movement at target joints, use of target muscles after min to mod verbal, visual, tactile cues. Updated HEP Person educated: Patient Education  method: Explanation, verbal cues, and handout; Education comprehension: verbalized understanding and returned demonstration   HOME EXERCISE PROGRAM: Access Code: [email or web address] URL: [email or web address] Date: 03/28/2022 Prepared by: Roxana Hires  Exercises - Wrist Flexion AROM  - 1 x daily - 7 x weekly - 2 sets - 10 reps - 3s hold - Wrist Extension AROM  - 1 x daily - 7 x weekly - 2 sets - 10 reps -  3s hold - Seated Scapular Retraction  - 1 x daily - 7 x weekly - 2 sets - 10 reps - 3s hold - Standing Elbow Flexion Extension AROM  - 1 x daily - 7 x weekly - 2 sets - 10 reps - 3s hold - Standing Shoulder Flexion to 90 Degrees  - 1 x daily - 7 x weekly - 2 sets - 10 reps - 3s hold - Shoulder Abduction - Thumbs Up  - 1 x daily - 7 x weekly - 2 sets - 10 reps - 3s hold   ASSESSMENT:  CLINICAL IMPRESSION: Pt continues making progress with both shoulders today. Continued L shoulder AROM today and pt is making steady progress however discontinued AROM scaption due to increase in pain. No HEP updates during session and pt encouraged to follow-up as scheduled. He has an upcoming follow-up with his orthopedic surgeon. Plan to progress LUE per protocol as pt is able to tolerate in addition to progressing resistance with RUE strengthening against gravity. He will benefit from skilled PT to address deficits in range of motion and strength in order to improve overall function at home and with leisure activities such as caring for his property.  REHAB POTENTIAL: Good  CLINICAL DECISION MAKING: Evolving/moderate complexity  EVALUATION COMPLEXITY: Moderate   GOALS: Goals reviewed with patient? Yes  SHORT TERM GOALS: Target date: 05/09/2022   Pt will be independent with HEP to improve strength and decrease right shoulder pain to improve pain-free function at home and work. Baseline:  Goal status: ONGOING   LONG TERM GOALS: Target date: 06/20/22;  Pt will increase L shoulder FOTO to at  least 51 to demonstrate significant improvement in function at home and with leisure activities related to L shoulder. Baseline: 01/03/22: To be completed; 01/24/22: 4; 02/28/22: 59; Goal status: ACHIEVED  2.  Pt will decrease worst L shoulder pain by at least 3 points on the NPRS in order to demonstrate clinically significant reduction in R shoulder pain. Baseline: 01/03/22: worst: 10/10; 02/28/22: 10/10; Goal status: ONGOING  3.  Pt will decrease quick DASH score by at least 8% in order to demonstrate clinically significant reduction in disability related to L shoulder pain        Baseline: 01/03/22: 47.7%; 02/28/22: 65.91% Goal status: ONGOING  4. Pt will demonstrate L shoulder flexion, abduction, IR, and ER strength of at least 4/5 in order to demonstrate improvement in strength and function       Baseline: 01/03/22: Unable to test per protocol; 02/28/22: At least 3/5 but unable to fully test due to protocol; Goal status: ONGOING  PLAN: PT FREQUENCY: 1-2x/week  PT DURATION: 12 weeks  PLANNED INTERVENTIONS: Therapeutic exercises, Therapeutic activity, Neuromuscular re-education, Balance training, Gait training, Patient/Family education, Joint manipulation, Joint mobilization, Vestibular training, Canalith repositioning, Aquatic Therapy, Dry Needling, Electrical stimulation, Spinal manipulation, Spinal mobilization, Cryotherapy, Moist heat, Traction, Ultrasound, Ionotophoresis 4mg /ml Dexamethasone, and Manual therapy  PLAN FOR NEXT SESSION: Progress L shoulder ROM and strength per protocol, continue R shoulder strengthening   Kendel Pesnell PT, DPT, GCS  04/02/2022, 9:01 PM

## 2022-04-03 DIAGNOSIS — Z96611 Presence of right artificial shoulder joint: Secondary | ICD-10-CM | POA: Diagnosis not present

## 2022-04-03 DIAGNOSIS — R7989 Other specified abnormal findings of blood chemistry: Secondary | ICD-10-CM | POA: Diagnosis not present

## 2022-04-03 DIAGNOSIS — E559 Vitamin D deficiency, unspecified: Secondary | ICD-10-CM | POA: Diagnosis not present

## 2022-04-03 DIAGNOSIS — Z79899 Other long term (current) drug therapy: Secondary | ICD-10-CM | POA: Diagnosis not present

## 2022-04-03 DIAGNOSIS — T84028A Dislocation of other internal joint prosthesis, initial encounter: Secondary | ICD-10-CM | POA: Diagnosis not present

## 2022-04-04 ENCOUNTER — Ambulatory Visit: Payer: Medicare Other

## 2022-04-04 ENCOUNTER — Telehealth: Payer: Self-pay | Admitting: Cardiovascular Disease

## 2022-04-04 DIAGNOSIS — M6281 Muscle weakness (generalized): Secondary | ICD-10-CM

## 2022-04-04 DIAGNOSIS — G8929 Other chronic pain: Secondary | ICD-10-CM

## 2022-04-04 NOTE — Telephone Encounter (Signed)
Patient with diagnosis of afib on Xarelto for anticoagulation.    Procedure: radiofrequency ablation Date of procedure: 05/01/22  CHA2DS2-VASc Score = 4  This indicates a 4.8% annual risk of stroke. The patient's score is based upon: CHF History: 1 HTN History: 1 Diabetes History: 0 Stroke History: 0 Vascular Disease History: 1 Age Score: 1 Gender Score: 0  Underwent cardioversion 03/24/22.  CrCl >147mL/min Platelet count 333K  DCCV > 30 days prior to scheduled procedure. Per office protocol, patient can hold Xarelto for 3 days prior to procedure.    **This guidance is not considered finalized until pre-operative APP has relayed final recommendations.**

## 2022-04-04 NOTE — Telephone Encounter (Signed)
Dr. Rockey Situ,   You have an office visit scheduled with patient on 05/02/2022 for follow-up of DCCV. He is requesting to hold Xarelto prior to radiofrequency spinal ablation on 05/01/2022. Do you want him to reschedule that for after you see him on the 16th?  Please route your response to P [facility] DIV Preop. I will communicate with requesting office and the patient once you have given recommendations.   Thank you!  Mayra Reel, NP

## 2022-04-04 NOTE — Telephone Encounter (Signed)
   Pre-operative Risk Assessment    Patient Name: [name]  DOB: 1955/11/29 MRN: [id number]      Request for Surgical Clearance    Procedure:   Radiofrequency Ablation  Date of Surgery:  Clearance 05/01/22                                 Surgeon:  Dr. Gillis Santa Surgeon's Group or Practice Name:  [facility]: [facility] Phone number:  [phone] Fax number:  Pt did not have the fax number   Type of Clearance Requested:   - Pharmacy:  Hold     rivaroxaban (XARELTO) 20 MG TABS tablet    Type of Anesthesia:  Not Indicated Pt stated there would be no anesthesia   Additional requests/questions:   Pt would like to know if it is okay for him to hold his Xarelto and how long can he hold the medication for.  [name]   04/04/2022, 10:24 AM

## 2022-04-05 ENCOUNTER — Ambulatory Visit: Payer: Medicare Other | Admitting: Cardiovascular Disease

## 2022-04-06 ENCOUNTER — Ambulatory Visit: Payer: Medicare Other

## 2022-04-06 DIAGNOSIS — M6281 Muscle weakness (generalized): Secondary | ICD-10-CM | POA: Diagnosis not present

## 2022-04-06 DIAGNOSIS — G8929 Other chronic pain: Secondary | ICD-10-CM | POA: Diagnosis not present

## 2022-04-06 DIAGNOSIS — M25512 Pain in left shoulder: Secondary | ICD-10-CM | POA: Diagnosis not present

## 2022-04-06 DIAGNOSIS — M25511 Pain in right shoulder: Secondary | ICD-10-CM | POA: Diagnosis not present

## 2022-04-06 NOTE — Therapy (Signed)
OUTPATIENT PHYSICAL THERAPY SHOULDER TREATMENT  Patient Name: [name] MRN: [id number] DOB:1955-08-23, 67 y.o., male Today's Date: 04/06/2022   PT End of Session - 04/06/22 1400     Visit Number 19    Number of Visits 49    Date for PT Re-Evaluation 06/20/22    Authorization Type [employer] Medicare    Authorization Time Period UHC medicare 2024  [id number] on [place]    PT Start Time 1401    PT Stop Time 1442    PT Time Calculation (min) 41 min    Activity Tolerance Patient tolerated treatment well    Behavior During Therapy WFL for tasks assessed/performed            Past Medical History:  Diagnosis Date   Anxiety    Aortic atherosclerosis ([place])    Cardiomyopathy (in setting of Afib)    a.) TTE 12/26/2013: EF 45-50%, mild ant and antsept HK. mild MR. Mod dil LA. nl RV fxn. Rhythm was Afib; b.) TTE 07/04/2019: EF 55%, mid-apical anteroseptal HK, mild MAC, mild Ao sclerosis, G2DD, RVSP 45.3   Chronic pain syndrome    a.) followed by pain management   Chronic, continuous use of opioids    a.) hydrocodone/APAP 7.5/325 mg; followed by pain management   Coronary artery disease 11/06/2014   a.) cCTA 11/06/2014: Ca score 224 (all in pLAD) -- 74th percentile for age/sex matched control   Diverticulosis    DJD (degenerative joint disease) of knee    History of hiatal hernia    History of kidney stones 2012   Hyperlipidemia    Hyperplastic colon polyp    Hypertension    Internal hemorrhoids    Intervertebral disc disorder with radiculopathy of lumbosacral region    Long term current use FULL DOSE (325 mg) aspirin    Long term current use of amiodarone    Long term current use of antithrombotics/antiplatelets    a.) rivaroxaban   Lower extremity edema    Mixed hyperlipidemia    Myalgia due to statin    Osteoarthritis    PAF (paroxysmal atrial fibrillation) ([place])    a.) CHA2DS2VASc = 4 (age, HTN, CHF, vascular disease history);  b.) s/p DCCV 03/13/2014 (200 J x1); c.) s/p DCCV  05/15/2019 (150 J x 1, 200 J x2); d.) rate/rhythm maintained on oral amiodarone + metoprolol succinate; chronically anticoagulated with rivaroxaban   Pernicious anemia    Prostate cancer ([place])    Pulmonary nodule, right    a. 10/2014 Cardiac CTA: 14mm RLL nodule; b. 04/2015 CT Chest: stable 61mm RLL nodule. No new nodules; 02/2017 CTA Chest: stable, benign, 7mm RLL pulm nodule.   Sleep difficulties    a.) takes melatonin + trazodone PRN   SVT (supraventricular tachycardia)    Tubular adenoma of colon    Past Surgical History:  Procedure Laterality Date   BICEPT TENODESIS Right 09/27/2021   Procedure: Right reverse shoulder arthroplasty, biceps tenodesis;  Surgeon: Leim Fabry, MD;  Location: [facility];  Service: Orthopedics;  Laterality: Right;   CARDIOVERSION N/A 05/15/2019   Procedure: CARDIOVERSION;  Surgeon: Minna Merritts, MD;  Location: [facility];  Service: Cardiovascular;  Laterality: N/A;   CARDIOVERSION N/A 03/13/2014   Procedure: CARDIOVERSION; Location: [place]; Surgeon: Ida Rogue, MD   CARDIOVERSION N/A 03/24/2022   Procedure: CARDIOVERSION;  Surgeon: Minna Merritts, MD;  Location: [facility];  Service: Cardiovascular;  Laterality: N/A;   CARPAL TUNNEL RELEASE Right 12/23/2014   Procedure: CARPAL TUNNEL RELEASE;  Surgeon: Nadara Mustard  Sabra Heck, MD;  Location: [facility];  Service: Orthopedics;  Laterality: Right;   CARPAL TUNNEL RELEASE Left 01/06/2015   Procedure: CARPAL TUNNEL RELEASE;  Surgeon: Earnestine Leys, MD;  Location: [facility];  Service: Orthopedics;  Laterality: Left;   COLONOSCOPY WITH PROPOFOL N/A 10/08/2017   Procedure: COLONOSCOPY WITH PROPOFOL;  Surgeon: Lin Landsman, MD;  Location: [facility];  Service: Gastroenterology;  Laterality: N/A;   GASTRIC BYPASS  01/17/2000   KNEE SURGERY Right    knee trauma x3   prostate seeding     REVERSE SHOULDER ARTHROPLASTY Right 09/27/2021   Procedure: Right reverse shoulder arthroplasty, biceps tenodesis;  Surgeon: Leim Fabry, MD;  Location: [facility];  Service: Orthopedics;  Laterality: Right;   SHOULDER ARTHROSCOPY WITH ROTATOR CUFF REPAIR AND OPEN BICEPS TENODESIS Left 12/27/2021   Procedure: Left shoulder arthroscopic cuff repair (supraspinatus and subscapularis) with Regeneten Patch application;  Surgeon: Leim Fabry, MD;  Location: [facility];  Service: Orthopedics;  Laterality: Left;   Patient Active Problem List   Diagnosis Date Noted   Chronic pain of both knees 03/30/2022   Bilateral primary osteoarthritis of knee 03/30/2022   Shortness of breath 03/24/2022   S/p reverse total shoulder arthroplasty 09/27/2021   Lesion of bone of lumbosacral spine (L5) 05/12/2021   Localized osteoarthritis of shoulder regions, bilateral 03/29/2021   Chronic pain of both shoulders 03/29/2021   Drug-induced myopathy 02/21/2021   Trigger finger of right hand 11/15/2020   Prostate cancer ([place]) 07/26/2020   Insomnia 08/01/2019   Primary osteoarthritis of both wrists 02/17/2019   Trigger middle finger of left hand 02/17/2019   Chronic pain syndrome 02/17/2019   Chronic radicular lumbar pain 10/08/2018   Lumbar radiculopathy 10/08/2018   Lumbar degenerative disc disease 10/08/2018   Lumbar facet arthropathy 10/08/2018   Lumbar facet joint syndrome 10/08/2018   Intervertebral disc disorder with radiculopathy of lumbosacral region    Osteoarthritis of knee 11/30/2017   Osteoarthritis of wrist 11/30/2017   Pernicious anemia 05/22/2016   Persistent atrial fibrillation ([place]) 12/20/2015   Carpal tunnel syndrome 11/16/2014   DJD (degenerative joint disease) of knee 10/27/2014   Bilateral carpal tunnel syndrome 10/27/2014   Morbid obesity with BMI of 50.0-59.9, adult ([place]) 10/27/2014   Mixed hyperlipidemia 10/27/2014   H/O gastric bypass 03/20/2014   Hyperkalemia 03/20/2014   History of prostate cancer 12/26/2013   Essential hypertension 12/26/2013   Encounter for anticoagulation discussion and counseling 12/26/2013    PCP: Olin Hauser, DO  REFERRING PROVIDER: Leim Fabry, MD  REFERRING DIAGNOSIS: Incomplete tear of left rotator cuff, unspecified whether traumatic M75.112   THERAPY DIAG: Chronic left shoulder pain  Chronic right shoulder pain  Muscle weakness (generalized)  RATIONALE FOR EVALUATION AND TREATMENT: Rehabilitation  ONSET DATE: Surgery 12/27/21, Chronic L shoulder pain for multiple years  FOLLOW UP APPT WITH PROVIDER: Yes    FROM INITIAL EVALUATION SUBJECTIVE:  Chief Complaint: S/p L shoulder RTC repair, biceps tenodesis, and shoulder debridement  Pertinent History Pt with history of chronic bilateral shoulder pain. He underwent right reverse TSR and right biceps tenodesis on 09/27/21. He experienced post-operative instability in R shoulder however it improved and he was able to avoid a revision. On 12/27/21 he underwent left arthroscopic rotator cuff repair (subscapularis (full-thickness), supraspinatus (partial-thickness) side-to-side repair + Regeneten patch), left arthroscopic biceps tenodesis, and left extensive debridement of shoulder (glenohumeral and subacromial spaces). Per surgical note other intraoperative findings include Type 2 SLAP tear, degenerative anterior labral tearing, grade 2 degenerative changes to articular cartilage of humeral head, and significant posterior capsule synovitis. No significant post-operative complications. He arrives without abduction pillow and states that Dr. Posey Pronto advised him he doesn't have to wear the abduction pillow. He got behind on his pain medication today and states that today his shoulder has been the most painful he has experienced since surgery. He also noticed swelling in L lateral upper arm this morning. He did take his pain medication at  14:00, before PT evaluation.  He has a history of chronic back pain and underwent a lumbar facet, medial branch radiofrequency ablation on 01/02/22. At the time of the evaluation pt has not yet appreciated improvement in his back pain from this procedure. He has a past medical history of Cardiomyopathy (in setting of Afib), DJD (degenerative joint disease) of knee, History of kidney stones, Intervertebral disc disorder with radiculopathy of lumbosacral region, Kidney stone (2012), Lower extremity edema, PAF (paroxysmal atrial fibrillation) ([place]), Pernicious anemia, Prostate cancer ([place]), Pulmonary nodule, right, and SVT (supraventricular tachycardia) ([place]). He also  has a past surgical history that includes Knee surgery; Gastric bypass (2002); prostate seeding; Carpal tunnel release (Right, 12/23/2014); Carpal tunnel release (Left, 01/06/2015); Colonoscopy with propofol (N/A, 10/08/2017); and Cardioversion (N/A, 05/15/2019).   Pain:  Pain Intensity: Present: 5/10, Best: 3/10, Worst: 10/10 Pain location: Anterior L shoulder Pain Quality: aching and throbbing Radiating: No  Numbness/Tingling: No Focal Weakness: Yes History of prior shoulder or neck/shoulder injury, pain, surgery, or therapy: Yes, chronic bilateral shoulder pain with R TSR. 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 No AROM or WB through LUE (per protocol), Passive elevation and ER in staged ROM to begin at week 4 (elevation 90 degrees weeks 4-6  with no ER; 0-120 weeks 6-8 with ER 0-30 degrees). No pulleys or cane until after 6 weeks, recommend no driving x 6-8 weeks.   Weight Bearing Restrictions: Yes No WB through LUE for at least 6 weeks  Living Environment Lives with: lives with their spouse Lives in: House/apartment  Patient Goals: Improve L shoulder strength and pain. Pt would like to be able to play with/lift his grandson as well as take care of his property.   OBJECTIVE:   Patient Surveys  FOTO: To be completed QuickDASH: 47.7%  Cognition Patient is oriented to person, place, and time.  Recent memory is intact.  Remote memory is intact.  Attention span and concentration are intact.  Expressive speech is intact.  Patient's fund of knowledge is within normal limits for educational  level.    Gross Musculoskeletal Assessment Tremor: None Bulk: Mild swelling noted in L lateral upper arm. Pt denies chills, fevers, redness, or dischrage from inicisions Tone: Normal  Posture Forward head and rounded shoulders  AROM Full L wrist AROM flexion, extension, radial deviation, and ulnar deviation. MCP flexion and extension WNL. Unable to assess L shoulder and elbow AROM per protocol  PROM Full L elbow PROM flexion and extension however during first attempt at slow passive extension pt reports sudden pain in L shoulder which eventually improve after a couple minutes of rest. Full L elbow PROM pronation/supination.   LE MMT: Full L wrist strength for flexion, extension, radial deviation, and ulnar deviation. L grip strength is intact and strong. Deferred testing of L elbow and shoulder strength per protocol  Sensation Grossly intact to light touch throughout LUE as determined by testing dermatomes C2-T2. Proprioception and hot/cold testing deferred on this date.  Palpation Pt is generally tender to light palpation around anterior, lateral, and posterior L shoulder;    TODAY'S TREATMENT    SUBJECTIVE: Pt reports  his L shoulder "feels comfortable". His L shoulder is doing a lot better the last week or so. Per Dr. Posey Pronto he is to continue PT 2x/week and at home 1x/week.   PAIN: Continued bilateral shoulder pain, worse in the L shoulder;   TREATMENT  Ther-ex: All exercises performed in partially reclined position unless otherwise specified:  L shoulder AROM flexion with combination of both bent and straight elbow to around 130 degrees 2 x 10;  L shoulder AROM scaption with therapist supporting weight of arm x 5. Remains aggravating despite PT support on UE and improved pain levels   L elbow AROM flexion 2 x 10;  L elbow manually resisted extension 2 x 10;  L shoulder isometric IR 3s hold 2 x 10;  L shoulder ER from stomach to vertical against gravity 3s hold 2 x 10;  R shoulder flexion from neutral to 90 degrees with 4# dumbbell 2 x 10;  R shoulder scaption from neutral to around 90 degrees 2 x 10;  R elbow flexion with 4# dumbbell 2 x 10;  R elbow extension with manual resistance 2 x 10;  R shoulder internal rotation from forearm vertical to stomach with manual resistance from therapist 2 x 10;  R shoulder external rotation from stomach to forearm vertical with 4# DB 2 x 10; Requires VC's for end range due to limited eccentric control.   R shoulder serratus punch with 4# DB 2 x 10; Max TC's at scapular and shoulder for form/technique. Good carryover on second set.    PATIENT EDUCATION:  Education details:  Pt educated throughout session about proper posture and technique with exercises. Improved exercise technique, movement at target joints, use of target muscles after min to mod verbal, visual, tactile cues. Updated HEP Person educated: Patient Education method: Explanation, verbal cues, and handout; Education comprehension: verbalized understanding and returned demonstration   HOME EXERCISE PROGRAM: Access Code: [email or web address] URL: [email or web address] Date:  03/28/2022 Prepared by: Roxana Hires  Exercises - Wrist Flexion AROM  - 1 x daily - 7 x weekly - 2 sets - 10 reps - 3s hold - Wrist Extension AROM  - 1 x daily - 7 x weekly - 2 sets - 10 reps - 3s hold - Seated Scapular Retraction  - 1 x daily - 7 x weekly - 2 sets - 10 reps - 3s hold - Standing Elbow Flexion Extension AROM  - 1  x daily - 7 x weekly - 2 sets - 10 reps - 3s hold - Standing Shoulder Flexion to 90 Degrees  - 1 x daily - 7 x weekly - 2 sets - 10 reps - 3s hold - Shoulder Abduction - Thumbs Up  - 1 x daily - 7 x weekly - 2 sets - 10 reps - 3s hold   ASSESSMENT:  CLINICAL IMPRESSION: Continuing PT POC as laid out from previous session. Continuing L shoulder AROM and R shoulder strengthening. Pt remains having pain with L shoulder scaption leading to inability to complete. Will continue to progress R shoulder strengthening and L shoulder AROM per protocol. Pt will benefit from skilled PT to address deficits in range of motion and strength in order to improve overall function at home and with leisure activities such as caring for his property.  REHAB POTENTIAL: Good  CLINICAL DECISION MAKING: Evolving/moderate complexity  EVALUATION COMPLEXITY: Moderate   GOALS: Goals reviewed with patient? Yes  SHORT TERM GOALS: Target date: 05/09/2022   Pt will be independent with HEP to improve strength and decrease right shoulder pain to improve pain-free function at home and work. Baseline:  Goal status: ONGOING   LONG TERM GOALS: Target date: 06/20/22;  Pt will increase L shoulder FOTO to at least 51 to demonstrate significant improvement in function at home and with leisure activities related to L shoulder. Baseline: 01/03/22: To be completed; 01/24/22: 4; 02/28/22: 59; Goal status: ACHIEVED  2.  Pt will decrease worst L shoulder pain by at least 3 points on the NPRS in order to demonstrate clinically significant reduction in R shoulder pain. Baseline: 01/03/22: worst: 10/10;  02/28/22: 10/10; Goal status: ONGOING  3.  Pt will decrease quick DASH score by at least 8% in order to demonstrate clinically significant reduction in disability related to L shoulder pain        Baseline: 01/03/22: 47.7%; 02/28/22: 65.91% Goal status: ONGOING  4. Pt will demonstrate L shoulder flexion, abduction, IR, and ER strength of at least 4/5 in order to demonstrate improvement in strength and function       Baseline: 01/03/22: Unable to test per protocol; 02/28/22: At least 3/5 but unable to fully test due to protocol; Goal status: ONGOING  PLAN: PT FREQUENCY: 1-2x/week  PT DURATION: 12 weeks  PLANNED INTERVENTIONS: Therapeutic exercises, Therapeutic activity, Neuromuscular re-education, Balance training, Gait training, Patient/Family education, Joint manipulation, Joint mobilization, Vestibular training, Canalith repositioning, Aquatic Therapy, Dry Needling, Electrical stimulation, Spinal manipulation, Spinal mobilization, Cryotherapy, Moist heat, Traction, Ultrasound, Ionotophoresis 4mg /ml Dexamethasone, and Manual therapy  PLAN FOR NEXT SESSION: Progress L shoulder ROM and strength per protocol, continue R shoulder strengthening   Salem Caster. Fairly IV, PT, DPT Physical Therapist- [facility]

## 2022-04-06 NOTE — Telephone Encounter (Signed)
   Patient Name: EBBIE BOURBON  DOB: October 22, 1955 MRN: [id number]  Primary Cardiologist: Ida Rogue, MD  Chart reviewed as part of pre-operative protocol coverage. Pre-op clearance already addressed by colleagues in earlier phone notes. To summarize recommendations:  -We can go ahead with his radiofrequency ablation of the spine on the scheduled date  Would hold Eliquis 3 days prior    DCCV > 30 days prior to scheduled procedure. Per office protocol, patient can hold Xarelto for 3 days prior to procedure.  Please restart when safe to do so.    Will route this bundled recommendation to requesting provider via Epic fax function and remove from pre-op pool. Please call with questions.  Elgie Collard, PA-C 04/06/2022, 8:33 AM

## 2022-04-06 NOTE — Telephone Encounter (Signed)
I s/w Tye Maryland who was able to provide fax # [phone] for Dr. Holley Raring. I did review the pt has been cleared and ok to hold Eliquis x 3 days prior to procedure.   Tye Maryland thanked me for the call.

## 2022-04-10 NOTE — Therapy (Unsigned)
OUTPATIENT PHYSICAL THERAPY SHOULDER TREATMENT/PROGRESS NOTE  Dates of reporting period  02/28/22   to   04/11/22   Patient Name: [name] MRN: [id number] DOB:[birth year], 67 y.o., male Today's Date: 04/12/2022   PT End of Session - 04/11/22 1356     Visit Number 20    Number of Visits 49    Date for PT Re-Evaluation 06/20/22    Authorization Type UHC Medicare    Authorization Time Period UHC medicare 2024  [id number] on [place]    PT Start Time 1400    PT Stop Time 1445    PT Time Calculation (min) 45 min    Activity Tolerance Patient tolerated treatment well    Behavior During Therapy WFL for tasks assessed/performed            Past Medical History:  Diagnosis Date   Anxiety    Aortic atherosclerosis ([place])    Cardiomyopathy (in setting of Afib)    a.) TTE 12/26/2013: EF 45-50%, mild ant and antsept HK. mild MR. Mod dil LA. nl RV fxn. Rhythm was Afib; b.) TTE 07/04/2019: EF 55%, mid-apical anteroseptal HK, mild MAC, mild Ao sclerosis, G2DD, RVSP 45.3   Chronic pain syndrome    a.) followed by pain management   Chronic, continuous use of opioids    a.) hydrocodone/APAP 7.5/325 mg; followed by pain management   Coronary artery disease 11/06/2014   a.) cCTA 11/06/2014: Ca score 224 (all in pLAD) -- 74th percentile for age/sex matched control   Diverticulosis    DJD (degenerative joint disease) of knee    History of hiatal hernia    History of kidney stones 2012   Hyperlipidemia    Hyperplastic colon polyp    Hypertension    Internal hemorrhoids    Intervertebral disc disorder with radiculopathy of lumbosacral region    Long term current use FULL DOSE (325 mg) aspirin    Long term current use of amiodarone    Long term current use of antithrombotics/antiplatelets    a.) rivaroxaban   Lower extremity edema    Mixed hyperlipidemia    Myalgia due to statin    Osteoarthritis    PAF (paroxysmal atrial fibrillation) ([place])    a.) CHA2DS2VASc = 4 (age, HTN, CHF, vascular  disease history);  b.) s/p DCCV 03/13/2014 (200 J x1); c.) s/p DCCV 05/15/2019 (150 J x 1, 200 J x2); d.) rate/rhythm maintained on oral amiodarone + metoprolol succinate; chronically anticoagulated with rivaroxaban   Pernicious anemia    Prostate cancer ([place])    Pulmonary nodule, right    a. 10/2014 Cardiac CTA: 56mm RLL nodule; b. 04/2015 CT Chest: stable 43mm RLL nodule. No new nodules; 02/2017 CTA Chest: stable, benign, 67mm RLL pulm nodule.   Sleep difficulties    a.) takes melatonin + trazodone PRN   SVT (supraventricular tachycardia)    Tubular adenoma of colon    Past Surgical History:  Procedure Laterality Date   BICEPT TENODESIS Right 09/27/2021   Procedure: Right reverse shoulder arthroplasty, biceps tenodesis;  Surgeon: Leim Fabry, MD;  Location: [facility];  Service: Orthopedics;  Laterality: Right;   CARDIOVERSION N/A 05/15/2019   Procedure: CARDIOVERSION;  Surgeon: Minna Merritts, MD;  Location: [facility];  Service: Cardiovascular;  Laterality: N/A;   CARDIOVERSION N/A 03/13/2014   Procedure: CARDIOVERSION; Location: [facility]; Surgeon: Ida Rogue, MD   CARDIOVERSION N/A 03/24/2022   Procedure: CARDIOVERSION;  Surgeon: Minna Merritts, MD;  Location: [facility];  Service: Cardiovascular;  Laterality: N/A;  CARPAL TUNNEL RELEASE Right 12/23/2014   Procedure: CARPAL TUNNEL RELEASE;  Surgeon: Earnestine Leys, MD;  Location: [facility];  Service: Orthopedics;  Laterality: Right;   CARPAL TUNNEL RELEASE Left 01/06/2015   Procedure: CARPAL TUNNEL RELEASE;  Surgeon: Earnestine Leys, MD;  Location: [facility];  Service: Orthopedics;  Laterality: Left;   COLONOSCOPY WITH PROPOFOL N/A 10/08/2017   Procedure: COLONOSCOPY WITH PROPOFOL;  Surgeon: Lin Landsman, MD;  Location: [facility];  Service: Gastroenterology;  Laterality: N/A;   GASTRIC BYPASS  01/17/2000   KNEE SURGERY Right    knee trauma x3   prostate seeding     REVERSE SHOULDER ARTHROPLASTY Right 09/27/2021   Procedure:  Right reverse shoulder arthroplasty, biceps tenodesis;  Surgeon: Leim Fabry, MD;  Location: [facility];  Service: Orthopedics;  Laterality: Right;   SHOULDER ARTHROSCOPY WITH ROTATOR CUFF REPAIR AND OPEN BICEPS TENODESIS Left 12/27/2021   Procedure: Left shoulder arthroscopic cuff repair (supraspinatus and subscapularis) with Regeneten Patch application;  Surgeon: Leim Fabry, MD;  Location: [facility];  Service: Orthopedics;  Laterality: Left;   Patient Active Problem List   Diagnosis Date Noted   Chronic pain of both knees 03/30/2022   Bilateral primary osteoarthritis of knee 03/30/2022   Shortness of breath 03/24/2022   S/p reverse total shoulder arthroplasty 09/27/2021   Lesion of bone of lumbosacral spine (L5) 05/12/2021   Localized osteoarthritis of shoulder regions, bilateral 03/29/2021   Chronic pain of both shoulders 03/29/2021   Drug-induced myopathy 02/21/2021   Trigger finger of right hand 11/15/2020   Prostate cancer ([place]) 07/26/2020   Insomnia 08/01/2019   Primary osteoarthritis of both wrists 02/17/2019   Trigger middle finger of left hand 02/17/2019   Chronic pain syndrome 02/17/2019   Chronic radicular lumbar pain 10/08/2018   Lumbar radiculopathy 10/08/2018   Lumbar degenerative disc disease 10/08/2018   Lumbar facet arthropathy 10/08/2018   Lumbar facet joint syndrome 10/08/2018   Intervertebral disc disorder with radiculopathy of lumbosacral region    Osteoarthritis of knee 11/30/2017   Osteoarthritis of wrist 11/30/2017   Pernicious anemia 05/22/2016   Persistent atrial fibrillation ([place]) 12/20/2015   Carpal tunnel syndrome 11/16/2014   DJD (degenerative joint disease) of knee 10/27/2014   Bilateral carpal tunnel syndrome 10/27/2014   Morbid obesity with BMI of 50.0-59.9, adult ([place]) 10/27/2014   Mixed hyperlipidemia 10/27/2014   H/O gastric bypass 03/20/2014   Hyperkalemia 03/20/2014   History of prostate cancer 12/26/2013   Essential hypertension 12/26/2013    Encounter for anticoagulation discussion and counseling 12/26/2013   PCP: Olin Hauser, DO  REFERRING PROVIDER: Leim Fabry, MD  REFERRING DIAGNOSIS: Incomplete tear of left rotator cuff, unspecified whether traumatic M75.112   THERAPY DIAG: Chronic left shoulder pain  Chronic right shoulder pain  Muscle weakness (generalized)  RATIONALE FOR EVALUATION AND TREATMENT: Rehabilitation  ONSET DATE: Surgery 12/27/21, Chronic L shoulder pain for multiple years  FOLLOW UP APPT WITH PROVIDER: Yes    FROM INITIAL EVALUATION SUBJECTIVE:  Chief Complaint: S/p L shoulder RTC repair, biceps tenodesis, and shoulder debridement  Pertinent History Pt with history of chronic bilateral shoulder pain. He underwent right reverse TSR and right biceps tenodesis on 09/27/21. He experienced post-operative instability in R shoulder however it improved and he was able to avoid a revision. On 12/27/21 he underwent left arthroscopic rotator cuff repair (subscapularis (full-thickness), supraspinatus (partial-thickness) side-to-side repair + Regeneten patch), left arthroscopic biceps tenodesis, and left extensive debridement of shoulder (glenohumeral and subacromial spaces). Per surgical note other intraoperative findings include Type 2 SLAP tear, degenerative anterior labral tearing, grade 2 degenerative changes to articular cartilage of humeral head, and significant posterior capsule synovitis. No significant post-operative complications. He arrives without abduction pillow and states that Dr. Posey Pronto advised him he doesn't have to wear the abduction pillow. He got behind on his pain medication today and states that today his shoulder has been the most painful he has experienced since surgery. He also noticed swelling in L  lateral upper arm this morning. He did take his pain medication at 14:00, before PT evaluation.  He has a history of chronic back pain and underwent a lumbar facet, medial branch radiofrequency ablation on 01/02/22. At the time of the evaluation pt has not yet appreciated improvement in his back pain from this procedure. He has a past medical history of Cardiomyopathy (in setting of Afib), DJD (degenerative joint disease) of knee, History of kidney stones, Intervertebral disc disorder with radiculopathy of lumbosacral region, Kidney stone (2012), Lower extremity edema, PAF (paroxysmal atrial fibrillation) ([place]), Pernicious anemia, Prostate cancer ([place]), Pulmonary nodule, right, and SVT (supraventricular tachycardia) ([place]). He also  has a past surgical history that includes Knee surgery; Gastric bypass (2002); prostate seeding; Carpal tunnel release (Right, 12/23/2014); Carpal tunnel release (Left, 01/06/2015); Colonoscopy with propofol (N/A, 10/08/2017); and Cardioversion (N/A, 05/15/2019).   Pain:  Pain Intensity: Present: 5/10, Best: 3/10, Worst: 10/10 Pain location: Anterior L shoulder Pain Quality: aching and throbbing Radiating: No  Numbness/Tingling: No Focal Weakness: Yes History of prior shoulder or neck/shoulder injury, pain, surgery, or therapy: Yes, chronic bilateral shoulder pain with R TSR. 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 No AROM or WB through LUE (per protocol), Passive elevation and  ER in staged ROM to begin at week 4 (elevation 90 degrees weeks 4-6 with no ER; 0-120 weeks 6-8 with ER 0-30 degrees). No pulleys or cane until after 6 weeks, recommend no driving x 6-8 weeks.   Weight Bearing Restrictions: Yes No WB through LUE for at least 6 weeks  Living Environment Lives with: lives with their spouse Lives in: House/apartment  Patient Goals: Improve L shoulder strength and pain. Pt would like to be able to play with/lift his grandson as well as take care of his property.   OBJECTIVE:   Patient Surveys  FOTO: To be completed QuickDASH: 47.7%  Cognition Patient is oriented to person, place, and time.  Recent memory is intact.  Remote memory is intact.  Attention span and concentration are intact.  Expressive speech is intact.  Patient's fund of knowledge is within normal limits for educational  level.    Gross Musculoskeletal Assessment Tremor: None Bulk: Mild swelling noted in L lateral upper arm. Pt denies chills, fevers, redness, or dischrage from inicisions Tone: Normal  Posture Forward head and rounded shoulders  AROM Full L wrist AROM flexion, extension, radial deviation, and ulnar deviation. MCP flexion and extension WNL. Unable to assess L shoulder and elbow AROM per protocol  PROM Full L elbow PROM flexion and extension however during first attempt at slow passive extension pt reports sudden pain in L shoulder which eventually improve after a couple minutes of rest. Full L elbow PROM pronation/supination.   LE MMT: Full L wrist strength for flexion, extension, radial deviation, and ulnar deviation. L grip strength is intact and strong. Deferred testing of L elbow and shoulder strength per protocol  Sensation Grossly intact to light touch throughout LUE as determined by testing dermatomes C2-T2. Proprioception and hot/cold testing deferred on this date.  Palpation Pt is generally tender to light palpation around anterior, lateral, and posterior  L shoulder;    TODAY'S TREATMENT    SUBJECTIVE: Pt reports his R shoulder is doing well today. He continues with pain in his L shoulder. No additional episodes of R shoulder instability. Per Dr. Posey Pronto notes he is to continue PT 2x/week and at home 1x/week.    PAIN: Continued bilateral shoulder pain, worse in the L shoulder;   TREATMENT  Ther-ex: Updated outcome measures with patient: QuickDASH (L shoulder): 56.8% Worst pain in L shoulder: 4/10; FOTO: 55  All exercises performed in partially reclined position unless otherwise specified: L shoulder AROM flexion with bent elbow (straight elbow painful today) to around 130 degrees 2 x 10; L shoulder AROM scaption to around 90 degrees with therapist supporting weight of arm 2 x 10; L elbow AROM flexion with light manual resistance 2 x 10; L elbow manually resisted extension 2 x 10; L shoulder IR from vertical to stomach with light manual resistance 2 x 10, no pain reported; L shoulder ER from stomach to vertical with light manual resistance 2 x 10; L shoulder manually resisted isometric extension x 10; R shoulder flexion from neutral to 90 degrees with 4# dumbbell (DB) 2 x 10; R shoulder scaption from neutral to around 90 degrees with 4# DB 2 x 10; R elbow flexion with manual resistance 2 x 10; R elbow extension with manual resistance 2 x 10; R shoulder internal rotation from forearm vertical to stomach with manual resistance from therapist 2 x 10; R shoulder external rotation from stomach to forearm vertical with manual resistance 2 x 10; R shoulder serratus punch with 4# DB x 10;   PATIENT EDUCATION:  Education details:  Pt educated throughout session about proper posture and technique with exercises. Improved exercise technique, movement at target joints, use of target muscles after min to mod verbal, visual, tactile cues. Plan of care Person educated: Patient Education method: Explanation, verbal cues Education comprehension:  verbalized understanding and returned demonstration   HOME EXERCISE PROGRAM: Access Code: [email or web address] URL: [email or web address] Date: 03/28/2022 Prepared by: Roxana Hires  Exercises - Wrist Flexion AROM  - 1 x daily - 7 x weekly - 2 sets - 10 reps - 3s hold - Wrist Extension AROM  - 1 x daily - 7 x weekly - 2 sets - 10 reps - 3s hold - Seated Scapular Retraction  - 1 x daily - 7 x weekly - 2 sets - 10 reps - 3s hold - Standing Elbow Flexion Extension AROM  - 1  x daily - 7 x weekly - 2 sets - 10 reps - 3s hold - Standing Shoulder Flexion to 90 Degrees  - 1 x daily - 7 x weekly - 2 sets - 10 reps - 3s hold - Shoulder Abduction - Thumbs Up  - 1 x daily - 7 x weekly - 2 sets - 10 reps - 3s hold   ASSESSMENT:  CLINICAL IMPRESSION: Pt continues making progress with both shoulders today. Updated outcome measures/goals for L shoulder. His FOTO improved from 4 at initial evaluation to 55 today. His worst pain has decreased from 10/10 to 4/10. His QuickDASH has worsened to 56.8% but this is an improvement compared to when it was last updated. He is able to perform AROM of L shoulder in all directions against gravity demonstrating at least 3/5 strength for internal and external rotation. L shoulder flexion is 4/5 and abduction is 4-/5. Pt is able to continue RUE during strengthening today. Continued AROM for L shoulder today however he reports more pain with a straight elbow so regressed to a bent elbow for flexion. Pt encouraged to continue HEP and follow-up as scheduled. Plan to progress LUE per protocol as pt is able to tolerate in addition to progressing resistance with RUE strengthening against gravity. He will benefit from skilled PT to address deficits in range of motion and strength in order to improve overall function at home and with leisure activities such as caring for his property.  REHAB POTENTIAL: Good  CLINICAL DECISION MAKING: Evolving/moderate complexity  EVALUATION  COMPLEXITY: Moderate   GOALS: Goals reviewed with patient? Yes  SHORT TERM GOALS: Target date: 05/09/2022   Pt will be independent with HEP to improve strength and decrease right shoulder pain to improve pain-free function at home and work. Baseline:  Goal status: ONGOING   LONG TERM GOALS: Target date: 06/20/22;  Pt will increase L shoulder FOTO to at least 51 to demonstrate significant improvement in function at home and with leisure activities related to L shoulder. Baseline: 01/03/22: To be completed; 01/24/22: 4; 02/28/22: 59; 04/11/22: 55; Goal status: ACHIEVED  2.  Pt will decrease worst L shoulder pain by at least 3 points on the NPRS in order to demonstrate clinically significant reduction in R shoulder pain. Baseline: 01/03/22: worst: 10/10; 02/28/22: 10/10; 04/11/22: 4/10; Goal status: ACHIEVED  3.  Pt will decrease quick DASH score by at least 8% in order to demonstrate clinically significant reduction in disability related to L shoulder pain        Baseline: 01/03/22: 47.7%; 02/28/22: 65.91%; 04/11/22: 56.8%; Goal status: ONGOING  4. Pt will demonstrate L shoulder flexion, abduction, IR, and ER strength of at least 4/5 in order to demonstrate improvement in strength and function       Baseline: 01/03/22: Unable to test per protocol; 02/28/22: At least 3/5 but unable to fully test due to protocol; 04/11/22: flexion: 4/5, abduction: 4-/5 (IR and ER at least 3/5 but not tested); Goal status: PARTIALLY MET  PLAN: PT FREQUENCY: 1-2x/week  PT DURATION: 12 weeks  PLANNED INTERVENTIONS: Therapeutic exercises, Therapeutic activity, Neuromuscular re-education, Balance training, Gait training, Patient/Family education, Joint manipulation, Joint mobilization, Vestibular training, Canalith repositioning, Aquatic Therapy, Dry Needling, Electrical stimulation, Spinal manipulation, Spinal mobilization, Cryotherapy, Moist heat, Traction, Ultrasound, Ionotophoresis 4mg /ml Dexamethasone, and Manual  therapy  PLAN FOR NEXT SESSION: Progress L shoulder ROM and strength per protocol, continue R shoulder strengthening   Phillips Grout PT, DPT, GCS  Physical Therapist- [facility]

## 2022-04-11 ENCOUNTER — Ambulatory Visit: Payer: Medicare Other

## 2022-04-11 DIAGNOSIS — M6281 Muscle weakness (generalized): Secondary | ICD-10-CM

## 2022-04-11 DIAGNOSIS — G8929 Other chronic pain: Secondary | ICD-10-CM | POA: Diagnosis not present

## 2022-04-11 DIAGNOSIS — M25512 Pain in left shoulder: Secondary | ICD-10-CM | POA: Diagnosis not present

## 2022-04-11 DIAGNOSIS — M25511 Pain in right shoulder: Secondary | ICD-10-CM | POA: Diagnosis not present

## 2022-04-13 ENCOUNTER — Other Ambulatory Visit: Payer: Self-pay | Admitting: Family Medicine

## 2022-04-13 ENCOUNTER — Ambulatory Visit: Payer: Medicare Other

## 2022-04-13 DIAGNOSIS — G4700 Insomnia, unspecified: Secondary | ICD-10-CM

## 2022-04-13 DIAGNOSIS — F419 Anxiety disorder, unspecified: Secondary | ICD-10-CM

## 2022-04-13 NOTE — Telephone Encounter (Signed)
Requested Prescriptions  Pending Prescriptions Disp Refills   busPIRone (BUSPAR) 10 MG tablet [Pharmacy Med Name: BUSPIRONE 10 MG TAB[*]] 360 tablet 1    Sig: TAKE ONE TABLET BY MOUTH FOUR TIMES A DAY AS NEEDED FOR ANXIETY     Psychiatry: Anxiolytics/Hypnotics - Non-controlled Passed - 04/13/2022 12:05 AM      Passed - Valid encounter within last 12 months    Recent Outpatient Visits           1 month ago Persistent atrial fibrillation [facility])   [place], DO   2 months ago Left inguinal pain   [place], DO   4 months ago [place], DO   8 months ago Annual physical exam   [facility] Olin Hauser, DO   10 months ago Lumbar degenerative disc disease   [place], Devonne Doughty, DO       Future Appointments             In 2 weeks Rockey Situ, Kathlene November, MD [place] at [place]   In 3 months [place], Devonne Doughty, [facility], [place]             DULoxetine (CYMBALTA) 30 MG capsule [Pharmacy Med Name: DULOXETINE DR 30 MG CAP[*]] 90 capsule 1    Sig: TAKE ONE CAPSULE BY MOUTH ONE TIME DAILY     Psychiatry: Antidepressants - SNRI - duloxetine Failed - 04/13/2022 12:05 AM      Failed - Last BP in normal range    BP Readings from Last 1 Encounters:  03/30/22 (!) 140/93         Passed - Cr in normal range and within 360 days    Creat  Date Value Ref Range Status  07/27/2021 1.13 0.70 - 1.35 mg/dL Final   Creatinine, Ser  Date Value Ref Range Status  03/22/2022 0.97 0.61 - 1.24 mg/dL Final         Passed - eGFR is 30 or above and within 360 days    GFR, Est African American  Date Value Ref Range Status  07/21/2020 72 > OR = 60 mL/min/1.32m2 Final   GFR, Est Non African American   Date Value Ref Range Status  07/21/2020 62 > OR = 60 mL/min/1.29m2 Final   GFR, Estimated  Date Value Ref Range Status  03/22/2022 >60 >60 mL/min Final    Comment:    (NOTE) Calculated using the CKD-EPI Creatinine Equation (2021)          Passed - Completed PHQ-2 or PHQ-9 in the last 360 days      Passed - Valid encounter within last 6 months    Recent Outpatient Visits           1 month ago Persistent atrial fibrillation [facility])   [place], DO   2 months ago Left inguinal pain   [place], DO   4 months ago [place], DO   8 months ago Annual physical exam   [place], DO   10 months ago Lumbar degenerative disc disease   [place]  [facility] [place], Devonne Doughty, DO       Future Appointments             In 2 weeks Rockey Situ, Kathlene November, MD [place] at [place]   In 3 months [place], [facility], [place]             traZODone (DESYREL) 150 MG tablet [place] Med Name: TRAZODONE 150 MG TAB[*]] 90 tablet 1    Sig: TAKE ONE TABLET BY MOUTH AT BEDTIME     Psychiatry: Antidepressants - Serotonin Modulator Passed - 04/13/2022 12:05 AM      Passed - Valid encounter within last 6 months    Recent Outpatient Visits           1 month ago Persistent atrial fibrillation [facility])   [place], DO   2 months ago Left inguinal pain   [place], DO   4 months ago [place], DO   8 months ago Annual physical exam   [facility] Olin Hauser, DO   10 months ago  Lumbar degenerative disc disease   [place], DO       Future Appointments             In 2 weeks Rockey Situ, Kathlene November, MD [place] at [place]   In 3 months [place], [facility], [facility]

## 2022-04-18 ENCOUNTER — Ambulatory Visit: Payer: Medicare Other | Attending: Orthopedic Surgery

## 2022-04-18 DIAGNOSIS — M25511 Pain in right shoulder: Secondary | ICD-10-CM | POA: Insufficient documentation

## 2022-04-18 DIAGNOSIS — M25512 Pain in left shoulder: Secondary | ICD-10-CM | POA: Diagnosis not present

## 2022-04-18 DIAGNOSIS — M6281 Muscle weakness (generalized): Secondary | ICD-10-CM

## 2022-04-18 DIAGNOSIS — G8929 Other chronic pain: Secondary | ICD-10-CM | POA: Diagnosis not present

## 2022-04-18 DIAGNOSIS — D509 Iron deficiency anemia, unspecified: Secondary | ICD-10-CM | POA: Diagnosis not present

## 2022-04-18 NOTE — Therapy (Signed)
OUTPATIENT PHYSICAL THERAPY SHOULDER TREATMENT  Patient Name: [name] MRN: [id number] DOB:[birth year], 67 y.o., male Today's Date: 04/19/2022  PT End of Session - 04/19/22 1051     Visit Number 21    Number of Visits 49    Date for PT Re-Evaluation 06/20/22    Authorization Type [employer] Medicare    Authorization Time Period [employer] medicare 2024  [id number] on [place]    PT Start Time 1400    PT Stop Time 1445    PT Time Calculation (min) 45 min    Activity Tolerance Patient tolerated treatment well    Behavior During Therapy WFL for tasks assessed/performed            Past Medical History:  Diagnosis Date   Anxiety    Aortic atherosclerosis    Cardiomyopathy (in setting of Afib)    a.) TTE 12/26/2013: EF 45-50%, mild ant and antsept HK. mild MR. Mod dil LA. nl RV fxn. Rhythm was Afib; b.) TTE 07/04/2019: EF 55%, mid-apical anteroseptal HK, mild MAC, mild Ao sclerosis, G2DD, RVSP 45.3   Chronic pain syndrome    a.) followed by pain management   Chronic, continuous use of opioids    a.) hydrocodone/APAP 7.5/325 mg; followed by pain management   Coronary artery disease 11/06/2014   a.) cCTA 11/06/2014: Ca score 224 (all in pLAD) -- 74th percentile for age/sex matched control   Diverticulosis    DJD (degenerative joint disease) of knee    History of hiatal hernia    History of kidney stones 2012   Hyperlipidemia    Hyperplastic colon polyp    Hypertension    Internal hemorrhoids    Intervertebral disc disorder with radiculopathy of lumbosacral region    Long term current use FULL DOSE (325 mg) aspirin    Long term current use of amiodarone    Long term current use of antithrombotics/antiplatelets    a.) rivaroxaban   Lower extremity edema    Mixed hyperlipidemia    Myalgia due to statin    Osteoarthritis    PAF (paroxysmal atrial fibrillation)    a.) CHA2DS2VASc = 4 (age, HTN, CHF, vascular disease history);  b.) s/p DCCV 03/13/2014 (200 J x1); c.) s/p DCCV 05/15/2019 (150 J x  1, 200 J x2); d.) rate/rhythm maintained on oral amiodarone + metoprolol succinate; chronically anticoagulated with rivaroxaban   Pernicious anemia    Prostate cancer    Pulmonary nodule, right    a. 10/2014 Cardiac CTA: 77mm RLL nodule; b. 04/2015 CT Chest: stable 71mm RLL nodule. No new nodules; 02/2017 CTA Chest: stable, benign, 66mm RLL pulm nodule.   Sleep difficulties    a.) takes melatonin + trazodone PRN   SVT (supraventricular tachycardia)    Tubular adenoma of colon    Past Surgical History:  Procedure Laterality Date   BICEPT TENODESIS Right 09/27/2021   Procedure: Right reverse shoulder arthroplasty, biceps tenodesis;  Surgeon: Leim Fabry, MD;  Location: [facility];  Service: Orthopedics;  Laterality: Right;   CARDIOVERSION N/A 05/15/2019   Procedure: CARDIOVERSION;  Surgeon: Minna Merritts, MD;  Location: [facility];  Service: Cardiovascular;  Laterality: N/A;   CARDIOVERSION N/A 03/13/2014   Procedure: CARDIOVERSION; Location: [facility]; Surgeon: Ida Rogue, MD   CARDIOVERSION N/A 03/24/2022   Procedure: CARDIOVERSION;  Surgeon: Minna Merritts, MD;  Location: [facility];  Service: Cardiovascular;  Laterality: N/A;   CARPAL TUNNEL RELEASE Right 12/23/2014   Procedure: CARPAL TUNNEL RELEASE;  Surgeon: Earnestine Leys, MD;  Location:  [facility];  Service: Orthopedics;  Laterality: Right;   CARPAL TUNNEL RELEASE Left 01/06/2015   Procedure: CARPAL TUNNEL RELEASE;  Surgeon: Earnestine Leys, MD;  Location: [facility];  Service: Orthopedics;  Laterality: Left;   COLONOSCOPY WITH PROPOFOL N/A 10/08/2017   Procedure: COLONOSCOPY WITH PROPOFOL;  Surgeon: Lin Landsman, MD;  Location: [facility];  Service: Gastroenterology;  Laterality: N/A;   GASTRIC BYPASS  01/17/2000   KNEE SURGERY Right    knee trauma x3   prostate seeding     REVERSE SHOULDER ARTHROPLASTY Right 09/27/2021   Procedure: Right reverse shoulder arthroplasty, biceps tenodesis;  Surgeon: Leim Fabry, MD;  Location: [facility];  Service: Orthopedics;  Laterality: Right;   SHOULDER ARTHROSCOPY WITH ROTATOR CUFF REPAIR AND OPEN BICEPS TENODESIS Left 12/27/2021   Procedure: Left shoulder arthroscopic cuff repair (supraspinatus and subscapularis) with Regeneten Patch application;  Surgeon: Leim Fabry, MD;  Location: [facility];  Service: Orthopedics;  Laterality: Left;   Patient Active Problem List   Diagnosis Date Noted   Chronic pain of both knees 03/30/2022   Bilateral primary osteoarthritis of knee 03/30/2022   Shortness of breath 03/24/2022   S/p reverse total shoulder arthroplasty 09/27/2021   Lesion of bone of lumbosacral spine (L5) 05/12/2021   Localized osteoarthritis of shoulder regions, bilateral 03/29/2021   Chronic pain of both shoulders 03/29/2021   Drug-induced myopathy 02/21/2021   Trigger finger of right hand 11/15/2020   Prostate cancer 07/26/2020   Insomnia 08/01/2019   Primary osteoarthritis of both wrists 02/17/2019   Trigger middle finger of left hand 02/17/2019   Chronic pain syndrome 02/17/2019   Chronic radicular lumbar pain 10/08/2018   Lumbar radiculopathy 10/08/2018   Lumbar degenerative disc disease 10/08/2018   Lumbar facet arthropathy 10/08/2018   Lumbar facet joint syndrome 10/08/2018   Intervertebral disc disorder with radiculopathy of lumbosacral region    Osteoarthritis of knee 11/30/2017   Osteoarthritis of wrist 11/30/2017   Pernicious anemia 05/22/2016   Persistent atrial fibrillation 12/20/2015   Carpal tunnel syndrome 11/16/2014   DJD (degenerative joint disease) of knee 10/27/2014   Bilateral carpal tunnel syndrome 10/27/2014   Morbid obesity with BMI of 50.0-59.9, adult 10/27/2014   Mixed hyperlipidemia 10/27/2014   H/O gastric bypass 03/20/2014   Hyperkalemia 03/20/2014   History of prostate cancer 12/26/2013   Essential hypertension 12/26/2013   Encounter for anticoagulation discussion and counseling 12/26/2013   PCP: Olin Hauser,  DO  REFERRING PROVIDER: Leim Fabry, MD  REFERRING DIAGNOSIS: Incomplete tear of left rotator cuff, unspecified whether traumatic M75.112   THERAPY DIAG: Chronic left shoulder pain  Chronic right shoulder pain  Muscle weakness (generalized)  RATIONALE FOR EVALUATION AND TREATMENT: Rehabilitation  ONSET DATE: Surgery 12/27/21, Chronic L shoulder pain for multiple years  FOLLOW UP APPT WITH PROVIDER: Yes    FROM INITIAL EVALUATION SUBJECTIVE:  Chief Complaint: S/p L shoulder RTC repair, biceps tenodesis, and shoulder debridement  Pertinent History Pt with history of chronic bilateral shoulder pain. He underwent right reverse TSR and right biceps tenodesis on 09/27/21. He experienced post-operative instability in R shoulder however it improved and he was able to avoid a revision. On 12/27/21 he underwent left arthroscopic rotator cuff repair (subscapularis (full-thickness), supraspinatus (partial-thickness) side-to-side repair + Regeneten patch), left arthroscopic biceps tenodesis, and left extensive debridement of shoulder (glenohumeral and subacromial spaces). Per surgical note other intraoperative findings include Type 2 SLAP tear, degenerative anterior labral tearing, grade 2 degenerative changes to articular cartilage of humeral head, and significant posterior capsule synovitis. No significant post-operative complications. He arrives without abduction pillow and states that Dr. Posey Pronto advised him he doesn't have to wear the abduction pillow. He got behind on his pain medication today and states that today his shoulder has been the most painful he has experienced since surgery. He also noticed swelling in L lateral upper arm this morning. He did take his pain medication at 14:00, before PT evaluation.  He has a  history of chronic back pain and underwent a lumbar facet, medial branch radiofrequency ablation on 01/02/22. At the time of the evaluation pt has not yet appreciated improvement in his back pain from this procedure. He has a past medical history of Cardiomyopathy (in setting of Afib), DJD (degenerative joint disease) of knee, History of kidney stones, Intervertebral disc disorder with radiculopathy of lumbosacral region, Kidney stone (2012), Lower extremity edema, PAF (paroxysmal atrial fibrillation) ([place]), Pernicious anemia, Prostate cancer ([place]), Pulmonary nodule, right, and SVT (supraventricular tachycardia) ([place]). He also  has a past surgical history that includes Knee surgery; Gastric bypass (2002); prostate seeding; Carpal tunnel release (Right, 12/23/2014); Carpal tunnel release (Left, 01/06/2015); Colonoscopy with propofol (N/A, 10/08/2017); and Cardioversion (N/A, 05/15/2019).   Pain:  Pain Intensity: Present: 5/10, Best: 3/10, Worst: 10/10 Pain location: Anterior L shoulder Pain Quality: aching and throbbing Radiating: No  Numbness/Tingling: No Focal Weakness: Yes History of prior shoulder or neck/shoulder injury, pain, surgery, or therapy: Yes, chronic bilateral shoulder pain with R TSR. 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 No AROM or WB through LUE (per protocol), Passive elevation and ER in staged ROM to begin at week 4 (elevation 90 degrees weeks 4-6 with no ER; 0-120 weeks 6-8 with ER  0-30 degrees). No pulleys or cane until after 6 weeks, recommend no driving x 6-8 weeks.   Weight Bearing Restrictions: Yes No WB through LUE for at least 6 weeks  Living Environment Lives with: lives with their spouse Lives in: House/apartment  Patient Goals: Improve L shoulder strength and pain. Pt would like to be able to play with/lift his grandson as well as take care of his property.   OBJECTIVE:   Patient Surveys  FOTO: To be completed QuickDASH: 47.7%  Cognition Patient is oriented to person, place, and time.  Recent memory is intact.  Remote memory is intact.  Attention span and concentration are intact.  Expressive speech is intact.  Patient's fund of knowledge is within normal limits for educational  level.    Gross Musculoskeletal Assessment Tremor: None Bulk: Mild swelling noted in L lateral upper arm. Pt denies chills, fevers, redness, or dischrage from inicisions Tone: Normal  Posture Forward head and rounded shoulders  AROM Full L wrist AROM flexion, extension, radial deviation, and ulnar deviation. MCP flexion and extension WNL. Unable to assess L shoulder and elbow AROM per protocol  PROM Full L elbow PROM flexion and extension however during first attempt at slow passive extension pt reports sudden pain in L shoulder which eventually improve after a couple minutes of rest. Full L elbow PROM pronation/supination.   LE MMT: Full L wrist strength for flexion, extension, radial deviation, and ulnar deviation. L grip strength is intact and strong. Deferred testing of L elbow and shoulder strength per protocol  Sensation Grossly intact to light touch throughout LUE as determined by testing dermatomes C2-T2. Proprioception and hot/cold testing deferred on this date.  Palpation Pt is generally tender to light palpation around anterior, lateral, and posterior L shoulder;    TODAY'S TREATMENT    SUBJECTIVE: Pt reports his R shoulder is doing well today. He  continues with pain in his L shoulder. No additional episodes of R shoulder instability. He is planning to try using his riding mower this afternoon.    PAIN: Continued bilateral shoulder pain, worse in the L shoulder;   TREATMENT  Ther-ex: All exercises performed in partially reclined position unless otherwise specified: R shoulder flexion from neutral to 110 degrees with 4# dumbbell (DB) 2 x 10; R shoulder scaption from neutral to around 90 degrees with 4# DB 2 x 10; R elbow flexion with 4# dumbbell (DB) 2 x 15; R elbow extension with manual resistance 2 x 15; R shoulder internal rotation from forearm vertical to stomach with manual resistance from therapist 2 x 10; R shoulder external rotation from stomach to forearm vertical with manual resistance 2 x 10; R shoulder serratus punch with 4# DB 2 x 10; R shoulder circle overhead CW/CCW with 4# DB x 10 each direction; R shoulder IR with manual resistance from therapist 2 x 10; L shoulder AROM flexion with straight elbow, slightly painful but less so today compared to last session, to around 130 degrees 2 x 10; L shoulder AROM scaption to around 120 degrees with therapist supporting weight of arm 2 x 10; L elbow AROM flexion with light manual resistance 2 x 10; L elbow manually resisted extension 2 x 10; L shoulder IR from vertical to stomach with light manual resistance 2 x 10, no pain reported; L shoulder ER from stomach to vertical with light manual resistance 2 x 10, no pain reported; L shoulder manually resisted isometric extension x 10; Ice pack applied to bilateral shoulders at end of session x 5 minutes (unbilled);   PATIENT EDUCATION:  Education details:  Pt educated throughout session about proper posture and technique with exercises. Improved exercise technique, movement at target joints, use of target muscles after min to mod verbal, visual, tactile cues. Plan of care Person educated: Patient Education method: Explanation,  verbal cues Education comprehension: verbalized understanding and returned demonstration   HOME EXERCISE PROGRAM: Access Code: [email or web address] URL: [email or web address] Date: 03/28/2022 Prepared by: Roxana Hires  Exercises - Wrist Flexion AROM  - 1 x daily - 7 x weekly - 2 sets - 10 reps - 3s hold - Wrist Extension AROM  - 1 x daily - 7 x weekly - 2 sets - 10 reps - 3s hold - Seated Scapular Retraction  -  1 x daily - 7 x weekly - 2 sets - 10 reps - 3s hold - Standing Elbow Flexion Extension AROM  - 1 x daily - 7 x weekly - 2 sets - 10 reps - 3s hold - Standing Shoulder Flexion to 90 Degrees  - 1 x daily - 7 x weekly - 2 sets - 10 reps - 3s hold - Shoulder Abduction - Thumbs Up  - 1 x daily - 7 x weekly - 2 sets - 10 reps - 3s hold   ASSESSMENT:  CLINICAL IMPRESSION: Pt continues making progress with both shoulders today. Pt is able to continue RUE during strengthening today and progresses the range of motion slightly higher. Continued AROM for L shoulder today and maintaining a straight elbow is not as painful as it was during previous session. Pt encouraged to continue HEP and follow-up as scheduled. Plan to progress LUE per protocol as pt is able to tolerate in addition to progressing resistance with RUE strengthening against gravity. He will benefit from skilled PT to address deficits in range of motion and strength in order to improve overall function at home and with leisure activities such as caring for his property.  REHAB POTENTIAL: Good  CLINICAL DECISION MAKING: Evolving/moderate complexity  EVALUATION COMPLEXITY: Moderate   GOALS: Goals reviewed with patient? Yes  SHORT TERM GOALS: Target date: 05/09/2022   Pt will be independent with HEP to improve strength and decrease right shoulder pain to improve pain-free function at home and work. Baseline:  Goal status: ONGOING   LONG TERM GOALS: Target date: 06/20/22;  Pt will increase L shoulder FOTO to at least  51 to demonstrate significant improvement in function at home and with leisure activities related to L shoulder. Baseline: 01/03/22: To be completed; 01/24/22: 4; 02/28/22: 59; 04/11/22: 55; Goal status: ACHIEVED  2.  Pt will decrease worst L shoulder pain by at least 3 points on the NPRS in order to demonstrate clinically significant reduction in R shoulder pain. Baseline: 01/03/22: worst: 10/10; 02/28/22: 10/10; 04/11/22: 4/10; Goal status: ACHIEVED  3.  Pt will decrease quick DASH score by at least 8% in order to demonstrate clinically significant reduction in disability related to L shoulder pain        Baseline: 01/03/22: 47.7%; 02/28/22: 65.91%; 04/11/22: 56.8%; Goal status: ONGOING  4. Pt will demonstrate L shoulder flexion, abduction, IR, and ER strength of at least 4/5 in order to demonstrate improvement in strength and function       Baseline: 01/03/22: Unable to test per protocol; 02/28/22: At least 3/5 but unable to fully test due to protocol; 04/11/22: flexion: 4/5, abduction: 4-/5 (IR and ER at least 3/5 but not tested); Goal status: PARTIALLY MET  PLAN: PT FREQUENCY: 1-2x/week  PT DURATION: 12 weeks  PLANNED INTERVENTIONS: Therapeutic exercises, Therapeutic activity, Neuromuscular re-education, Balance training, Gait training, Patient/Family education, Joint manipulation, Joint mobilization, Vestibular training, Canalith repositioning, Aquatic Therapy, Dry Needling, Electrical stimulation, Spinal manipulation, Spinal mobilization, Cryotherapy, Moist heat, Traction, Ultrasound, Ionotophoresis 4mg /ml Dexamethasone, and Manual therapy  PLAN FOR NEXT SESSION: Progress L shoulder ROM and strength per protocol, continue R shoulder strengthening   Lyndel Safe Kayin Kettering PT, DPT, GCS  Physical Therapist- [facility]

## 2022-04-19 ENCOUNTER — Encounter: Payer: Self-pay | Admitting: Cardiovascular Disease

## 2022-04-21 ENCOUNTER — Ambulatory Visit: Payer: Medicare Other

## 2022-04-21 DIAGNOSIS — G8929 Other chronic pain: Secondary | ICD-10-CM | POA: Diagnosis not present

## 2022-04-21 DIAGNOSIS — M6281 Muscle weakness (generalized): Secondary | ICD-10-CM | POA: Diagnosis not present

## 2022-04-21 DIAGNOSIS — M25511 Pain in right shoulder: Secondary | ICD-10-CM | POA: Diagnosis not present

## 2022-04-21 DIAGNOSIS — M25512 Pain in left shoulder: Secondary | ICD-10-CM | POA: Diagnosis not present

## 2022-04-21 NOTE — Therapy (Signed)
OUTPATIENT PHYSICAL THERAPY SHOULDER TREATMENT  Patient Name: [name] MRN: [id number] DOB:[birth year], 67 y.o., male Today's Date: 04/21/2022  PT End of Session - 04/21/22 1102     Visit Number 22    Number of Visits 49    Date for PT Re-Evaluation 06/20/22    Authorization Type [employer] Medicare    Authorization Time Period [employer] medicare 2024  NW:GNFAOVL:based on [place]    PT Start Time 1101    PT Stop Time 1145    PT Time Calculation (min) 44 min    Activity Tolerance Patient tolerated treatment well    Behavior During Therapy WFL for tasks assessed/performed            Past Medical History:  Diagnosis Date   Anxiety    Aortic atherosclerosis    Cardiomyopathy (in setting of Afib)    a.) TTE 12/26/2013: EF 45-50%, mild ant and antsept HK. mild MR. Mod dil LA. nl RV fxn. Rhythm was Afib; b.) TTE 07/04/2019: EF 55%, mid-apical anteroseptal HK, mild MAC, mild Ao sclerosis, G2DD, RVSP 45.3   Chronic pain syndrome    a.) followed by pain management   Chronic, continuous use of opioids    a.) hydrocodone/APAP 7.5/325 mg; followed by pain management   Coronary artery disease 11/06/2014   a.) cCTA 11/06/2014: Ca score 224 (all in pLAD) -- 74th percentile for age/sex matched control   Diverticulosis    DJD (degenerative joint disease) of knee    History of hiatal hernia    History of kidney stones 2012   Hyperlipidemia    Hyperplastic colon polyp    Hypertension    Internal hemorrhoids    Intervertebral disc disorder with radiculopathy of lumbosacral region    Long term current use FULL DOSE (325 mg) aspirin    Long term current use of amiodarone    Long term current use of antithrombotics/antiplatelets    a.) rivaroxaban   Lower extremity edema    Mixed hyperlipidemia    Myalgia due to statin    Osteoarthritis    PAF (paroxysmal atrial fibrillation)    a.) CHA2DS2VASc = 4 (age, HTN, CHF, vascular disease history);  b.) s/p DCCV 03/13/2014 (200 J x1); c.) s/p DCCV 05/15/2019 (150 J x  1, 200 J x2); d.) rate/rhythm maintained on oral amiodarone + metoprolol succinate; chronically anticoagulated with rivaroxaban   Pernicious anemia    Prostate cancer    Pulmonary nodule, right    a. 10/2014 Cardiac CTA: 7mm RLL nodule; b. 04/2015 CT Chest: stable 7mm RLL nodule. No new nodules; 02/2017 CTA Chest: stable, benign, 7mm RLL pulm nodule.   Sleep difficulties    a.) takes melatonin + trazodone PRN   SVT (supraventricular tachycardia)    Tubular adenoma of colon    Past Surgical History:  Procedure Laterality Date   BICEPT TENODESIS Right 09/27/2021   Procedure: Right reverse shoulder arthroplasty, biceps tenodesis;  Surgeon: Signa KellPatel, Sunny, MD;  Location: [facility];  Service: Orthopedics;  Laterality: Right;   CARDIOVERSION N/A 05/15/2019   Procedure: CARDIOVERSION;  Surgeon: Antonieta IbaGollan, Timothy J, MD;  Location: [facility];  Service: Cardiovascular;  Laterality: N/A;   CARDIOVERSION N/A 03/13/2014   Procedure: CARDIOVERSION; Location: [facility]; Surgeon: Julien Nordmannimothy Gollan, MD   CARDIOVERSION N/A 03/24/2022   Procedure: CARDIOVERSION;  Surgeon: Antonieta IbaGollan, Timothy J, MD;  Location: [facility];  Service: Cardiovascular;  Laterality: N/A;   CARPAL TUNNEL RELEASE Right 12/23/2014   Procedure: CARPAL TUNNEL RELEASE;  Surgeon: Deeann SaintHoward Miller, MD;  Location:  [facility];  Service: Orthopedics;  Laterality: Right;   CARPAL TUNNEL RELEASE Left 01/06/2015   Procedure: CARPAL TUNNEL RELEASE;  Surgeon: Earnestine Leys, MD;  Location: [facility];  Service: Orthopedics;  Laterality: Left;   COLONOSCOPY WITH PROPOFOL N/A 10/08/2017   Procedure: COLONOSCOPY WITH PROPOFOL;  Surgeon: Lin Landsman, MD;  Location: [facility];  Service: Gastroenterology;  Laterality: N/A;   GASTRIC BYPASS  01/17/2000   KNEE SURGERY Right    knee trauma x3   prostate seeding     REVERSE SHOULDER ARTHROPLASTY Right 09/27/2021   Procedure: Right reverse shoulder arthroplasty, biceps tenodesis;  Surgeon: Leim Fabry, MD;  Location: [facility];  Service: Orthopedics;  Laterality: Right;   SHOULDER ARTHROSCOPY WITH ROTATOR CUFF REPAIR AND OPEN BICEPS TENODESIS Left 12/27/2021   Procedure: Left shoulder arthroscopic cuff repair (supraspinatus and subscapularis) with Regeneten Patch application;  Surgeon: Leim Fabry, MD;  Location: [facility];  Service: Orthopedics;  Laterality: Left;   Patient Active Problem List   Diagnosis Date Noted   Chronic pain of both knees 03/30/2022   Bilateral primary osteoarthritis of knee 03/30/2022   Shortness of breath 03/24/2022   S/p reverse total shoulder arthroplasty 09/27/2021   Lesion of bone of lumbosacral spine (L5) 05/12/2021   Localized osteoarthritis of shoulder regions, bilateral 03/29/2021   Chronic pain of both shoulders 03/29/2021   Drug-induced myopathy 02/21/2021   Trigger finger of right hand 11/15/2020   Prostate cancer 07/26/2020   Insomnia 08/01/2019   Primary osteoarthritis of both wrists 02/17/2019   Trigger middle finger of left hand 02/17/2019   Chronic pain syndrome 02/17/2019   Chronic radicular lumbar pain 10/08/2018   Lumbar radiculopathy 10/08/2018   Lumbar degenerative disc disease 10/08/2018   Lumbar facet arthropathy 10/08/2018   Lumbar facet joint syndrome 10/08/2018   Intervertebral disc disorder with radiculopathy of lumbosacral region    Osteoarthritis of knee 11/30/2017   Osteoarthritis of wrist 11/30/2017   Pernicious anemia 05/22/2016   Persistent atrial fibrillation 12/20/2015   Carpal tunnel syndrome 11/16/2014   DJD (degenerative joint disease) of knee 10/27/2014   Bilateral carpal tunnel syndrome 10/27/2014   Morbid obesity with BMI of 50.0-59.9, adult 10/27/2014   Mixed hyperlipidemia 10/27/2014   H/O gastric bypass 03/20/2014   Hyperkalemia 03/20/2014   History of prostate cancer 12/26/2013   Essential hypertension 12/26/2013   Encounter for anticoagulation discussion and counseling 12/26/2013   PCP: Olin Hauser,  DO  REFERRING PROVIDER: Leim Fabry, MD  REFERRING DIAGNOSIS: Incomplete tear of left rotator cuff, unspecified whether traumatic M75.112   THERAPY DIAG: Chronic left shoulder pain  Chronic right shoulder pain  Muscle weakness (generalized)  RATIONALE FOR EVALUATION AND TREATMENT: Rehabilitation  ONSET DATE: Surgery 12/27/21, Chronic L shoulder pain for multiple years  FOLLOW UP APPT WITH PROVIDER: Yes    FROM INITIAL EVALUATION SUBJECTIVE:  Chief Complaint: S/p L shoulder RTC repair, biceps tenodesis, and shoulder debridement  Pertinent History Pt with history of chronic bilateral shoulder pain. He underwent right reverse TSR and right biceps tenodesis on 09/27/21. He experienced post-operative instability in R shoulder however it improved and he was able to avoid a revision. On 12/27/21 he underwent left arthroscopic rotator cuff repair (subscapularis (full-thickness), supraspinatus (partial-thickness) side-to-side repair + Regeneten patch), left arthroscopic biceps tenodesis, and left extensive debridement of shoulder (glenohumeral and subacromial spaces). Per surgical note other intraoperative findings include Type 2 SLAP tear, degenerative anterior labral tearing, grade 2 degenerative changes to articular cartilage of humeral head, and significant posterior capsule synovitis. No significant post-operative complications. He arrives without abduction pillow and states that Dr. Posey Pronto advised him he doesn't have to wear the abduction pillow. He got behind on his pain medication today and states that today his shoulder has been the most painful he has experienced since surgery. He also noticed swelling in L lateral upper arm this morning. He did take his pain medication at 14:00, before PT evaluation.  He has a  history of chronic back pain and underwent a lumbar facet, medial branch radiofrequency ablation on 01/02/22. At the time of the evaluation pt has not yet appreciated improvement in his back pain from this procedure. He has a past medical history of Cardiomyopathy (in setting of Afib), DJD (degenerative joint disease) of knee, History of kidney stones, Intervertebral disc disorder with radiculopathy of lumbosacral region, Kidney stone (2012), Lower extremity edema, PAF (paroxysmal atrial fibrillation) ([place]), Pernicious anemia, Prostate cancer ([place]), Pulmonary nodule, right, and SVT (supraventricular tachycardia) ([place]). He also  has a past surgical history that includes Knee surgery; Gastric bypass (2002); prostate seeding; Carpal tunnel release (Right, 12/23/2014); Carpal tunnel release (Left, 01/06/2015); Colonoscopy with propofol (N/A, 10/08/2017); and Cardioversion (N/A, 05/15/2019).   Pain:  Pain Intensity: Present: 5/10, Best: 3/10, Worst: 10/10 Pain location: Anterior L shoulder Pain Quality: aching and throbbing Radiating: No  Numbness/Tingling: No Focal Weakness: Yes History of prior shoulder or neck/shoulder injury, pain, surgery, or therapy: Yes, chronic bilateral shoulder pain with R TSR. 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 No AROM or WB through LUE (per protocol), Passive elevation and ER in staged ROM to begin at week 4 (elevation 90 degrees weeks 4-6 with no ER; 0-120 weeks 6-8 with ER  0-30 degrees). No pulleys or cane until after 6 weeks, recommend no driving x 6-8 weeks.   Weight Bearing Restrictions: Yes No WB through LUE for at least 6 weeks  Living Environment Lives with: lives with their spouse Lives in: House/apartment  Patient Goals: Improve L shoulder strength and pain. Pt would like to be able to play with/lift his grandson as well as take care of his property.   OBJECTIVE:   Patient Surveys  FOTO: To be completed QuickDASH: 47.7%  Cognition Patient is oriented to person, place, and time.  Recent memory is intact.  Remote memory is intact.  Attention span and concentration are intact.  Expressive speech is intact.  Patient's fund of knowledge is within normal limits for educational  level.    Gross Musculoskeletal Assessment Tremor: None Bulk: Mild swelling noted in L lateral upper arm. Pt denies chills, fevers, redness, or dischrage from inicisions Tone: Normal  Posture Forward head and rounded shoulders  AROM Full L wrist AROM flexion, extension, radial deviation, and ulnar deviation. MCP flexion and extension WNL. Unable to assess L shoulder and elbow AROM per protocol  PROM Full L elbow PROM flexion and extension however during first attempt at slow passive extension pt reports sudden pain in L shoulder which eventually improve after a couple minutes of rest. Full L elbow PROM pronation/supination.   LE MMT: Full L wrist strength for flexion, extension, radial deviation, and ulnar deviation. L grip strength is intact and strong. Deferred testing of L elbow and shoulder strength per protocol  Sensation Grossly intact to light touch throughout LUE as determined by testing dermatomes C2-T2. Proprioception and hot/cold testing deferred on this date.  Palpation Pt is generally tender to light palpation around anterior, lateral, and posterior L shoulder;    TODAY'S TREATMENT    SUBJECTIVE: Pt reports his shoulders are doing well today. He  did have an episode one night where he felt like his R shoulder partially subluxed and he experienced severe pain however it quickly reduced. He was able to use his riding mower without an increase in shoulder pain however it did worsen his low back pain.   PAIN: Intermittent bilateral shoulder pain;   TREATMENT  Ther-ex: All exercises performed in upright position today unless otherwise specified: R shoulder flexion from neutral to 90 degrees with 4# dumbbell (DB) 2 x 10; R shoulder scaption from neutral to around 90 degrees with 4# DB 2 x 8; R elbow flexion with 5# dumbbell (DB) 2 x 15; R elbow extension with manual resistance 2 x 15; R shoulder internal rotation from forearm vertical to stomach with manual resistance from therapist 2 x 15; R shoulder external rotation from stomach to forearm vertical with 5# DB 2 x 10; R shoulder serratus punch at 110 degrees of flexion x 10; R shoulder circles overhead CW/CCW with 4# DB x 10 each direction; L shoulder AROM flexion with straight elbow from neutral to around 90 degrees 2 x 10, slightly painful today but improves with repetition.  L shoulder AROM scaption to around 90 degrees with therapist partially supporting weight of arm 2 x 10; L elbow AROM flexion with light manual resistance 2 x 15; L elbow manually resisted extension 2 x 15; L shoulder IR from vertical to stomach with light manual resistance 2 x 10, no pain reported; L shoulder ER from stomach to vertical with light manual resistance 2 x 10, no pain reported; L shoulder manually resisted isometric extension, abduction, and flexion 2 x 10 each; Seated shoulder extension from 60 flexion to neutral with green tband 2 x 10 BUE; Seated shoulder rows never moving elbows past mid axilla, green tband 2 x 10 BUE; Ice pack applied to bilateral shoulders at end of session x 5 minutes (unbilled);   PATIENT EDUCATION:  Education details:  Pt educated throughout session about proper posture  and technique with exercises. Improved exercise technique, movement at target joints, use of target muscles after min to mod verbal, visual, tactile cues. Person educated: Patient Education method: Explanation, verbal cues Education comprehension: verbalized understanding and returned demonstration   HOME EXERCISE PROGRAM: Access Code: [email or web address] URL: [email or web address] Date: 03/28/2022 Prepared by: Ria Comment  Exercises - Wrist Flexion AROM  - 1 x daily - 7  x weekly - 2 sets - 10 reps - 3s hold - Wrist Extension AROM  - 1 x daily - 7 x weekly - 2 sets - 10 reps - 3s hold - Seated Scapular Retraction  - 1 x daily - 7 x weekly - 2 sets - 10 reps - 3s hold - Standing Elbow Flexion Extension AROM  - 1 x daily - 7 x weekly - 2 sets - 10 reps - 3s hold - Standing Shoulder Flexion to 90 Degrees  - 1 x daily - 7 x weekly - 2 sets - 10 reps - 3s hold - Shoulder Abduction - Thumbs Up  - 1 x daily - 7 x weekly - 2 sets - 10 reps - 3s hold   ASSESSMENT:  CLINICAL IMPRESSION: Pt continues making improvement in bilateral shoulder strength and pain. Progressed strengthening today performing exercises in fully upright position today. Continued AROM for L shoulder today against gravity. Pt encouraged to continue HEP and follow-up as scheduled. Plan to progress LUE per protocol as pt is able to tolerate in addition to progressing resistance with RUE strengthening against gravity. He will benefit from skilled PT to address deficits in range of motion and strength in order to improve overall function at home and with leisure activities such as caring for his property.  REHAB POTENTIAL: Good  CLINICAL DECISION MAKING: Evolving/moderate complexity  EVALUATION COMPLEXITY: Moderate   GOALS: Goals reviewed with patient? Yes  SHORT TERM GOALS: Target date: 05/09/2022   Pt will be independent with HEP to improve strength and decrease right shoulder pain to improve pain-free function at  home and work. Baseline:  Goal status: ONGOING   LONG TERM GOALS: Target date: 06/20/22;  Pt will increase L shoulder FOTO to at least 51 to demonstrate significant improvement in function at home and with leisure activities related to L shoulder. Baseline: 01/03/22: To be completed; 01/24/22: 4; 02/28/22: 59; 04/11/22: 55; Goal status: ACHIEVED  2.  Pt will decrease worst L shoulder pain by at least 3 points on the NPRS in order to demonstrate clinically significant reduction in R shoulder pain. Baseline: 01/03/22: worst: 10/10; 02/28/22: 10/10; 04/11/22: 4/10; Goal status: ACHIEVED  3.  Pt will decrease quick DASH score by at least 8% in order to demonstrate clinically significant reduction in disability related to L shoulder pain        Baseline: 01/03/22: 47.7%; 02/28/22: 65.91%; 04/11/22: 56.8%; Goal status: ONGOING  4. Pt will demonstrate L shoulder flexion, abduction, IR, and ER strength of at least 4/5 in order to demonstrate improvement in strength and function       Baseline: 01/03/22: Unable to test per protocol; 02/28/22: At least 3/5 but unable to fully test due to protocol; 04/11/22: flexion: 4/5, abduction: 4-/5 (IR and ER at least 3/5 but not tested); Goal status: PARTIALLY MET  PLAN: PT FREQUENCY: 1-2x/week  PT DURATION: 12 weeks  PLANNED INTERVENTIONS: Therapeutic exercises, Therapeutic activity, Neuromuscular re-education, Balance training, Gait training, Patient/Family education, Joint manipulation, Joint mobilization, Vestibular training, Canalith repositioning, Aquatic Therapy, Dry Needling, Electrical stimulation, Spinal manipulation, Spinal mobilization, Cryotherapy, Moist heat, Traction, Ultrasound, Ionotophoresis 4mg /ml Dexamethasone, and Manual therapy  PLAN FOR NEXT SESSION: Progress L shoulder ROM and strength per protocol, continue R shoulder strengthening   Sharalyn Ink Vida Nicol PT, DPT, GCS  Physical Therapist- [place]  [facility]

## 2022-04-25 ENCOUNTER — Ambulatory Visit: Payer: Medicare Other

## 2022-04-25 DIAGNOSIS — M25511 Pain in right shoulder: Secondary | ICD-10-CM | POA: Diagnosis not present

## 2022-04-25 DIAGNOSIS — M6281 Muscle weakness (generalized): Secondary | ICD-10-CM | POA: Diagnosis not present

## 2022-04-25 DIAGNOSIS — M25512 Pain in left shoulder: Secondary | ICD-10-CM | POA: Diagnosis not present

## 2022-04-25 DIAGNOSIS — G8929 Other chronic pain: Secondary | ICD-10-CM | POA: Diagnosis not present

## 2022-04-25 NOTE — Therapy (Signed)
OUTPATIENT PHYSICAL THERAPY SHOULDER TREATMENT  Patient Name: [name] MRN: [id number] DOB:1955/12/29, 67 y.o., male Today's Date: 04/25/2022   PT End of Session - 04/25/22 1409     Visit Number 23    Number of Visits 49    Date for PT Re-Evaluation 06/20/22    Authorization Type [employer] Medicare    Authorization Time Period [employer] medicare 2024  [id number] on [place]    PT Start Time 1402    PT Stop Time 1445    PT Time Calculation (min) 43 min    Activity Tolerance Patient tolerated treatment well    Behavior During Therapy WFL for tasks assessed/performed            Past Medical History:  Diagnosis Date   Anxiety    Aortic atherosclerosis    Cardiomyopathy (in setting of Afib)    a.) TTE 12/26/2013: EF 45-50%, mild ant and antsept HK. mild MR. Mod dil LA. nl RV fxn. Rhythm was Afib; b.) TTE 07/04/2019: EF 55%, mid-apical anteroseptal HK, mild MAC, mild Ao sclerosis, G2DD, RVSP 45.3   Chronic pain syndrome    a.) followed by pain management   Chronic, continuous use of opioids    a.) hydrocodone/APAP 7.5/325 mg; followed by pain management   Coronary artery disease 11/06/2014   a.) cCTA 11/06/2014: Ca score 224 (all in pLAD) -- 74th percentile for age/sex matched control   Diverticulosis    DJD (degenerative joint disease) of knee    History of hiatal hernia    History of kidney stones 2012   Hyperlipidemia    Hyperplastic colon polyp    Hypertension    Internal hemorrhoids    Intervertebral disc disorder with radiculopathy of lumbosacral region    Long term current use FULL DOSE (325 mg) aspirin    Long term current use of amiodarone    Long term current use of antithrombotics/antiplatelets    a.) rivaroxaban   Lower extremity edema    Mixed hyperlipidemia    Myalgia due to statin    Osteoarthritis    PAF (paroxysmal atrial fibrillation)    a.) CHA2DS2VASc = 4 (age, HTN, CHF, vascular disease history);  b.) s/p DCCV 03/13/2014 (200 J x1); c.) s/p DCCV 05/15/2019 (150 J x  1, 200 J x2); d.) rate/rhythm maintained on oral amiodarone + metoprolol succinate; chronically anticoagulated with rivaroxaban   Pernicious anemia    Prostate cancer    Pulmonary nodule, right    a. 10/2014 Cardiac CTA: 7mm RLL nodule; b. 04/2015 CT Chest: stable 7mm RLL nodule. No new nodules; 02/2017 CTA Chest: stable, benign, 7mm RLL pulm nodule.   Sleep difficulties    a.) takes melatonin + trazodone PRN   SVT (supraventricular tachycardia)    Tubular adenoma of colon    Past Surgical History:  Procedure Laterality Date   BICEPT TENODESIS Right 09/27/2021   Procedure: Right reverse shoulder arthroplasty, biceps tenodesis;  Surgeon: Signa Kell, MD;  Location: [facility];  Service: Orthopedics;  Laterality: Right;   CARDIOVERSION N/A 05/15/2019   Procedure: CARDIOVERSION;  Surgeon: Antonieta Iba, MD;  Location: [facility];  Service: Cardiovascular;  Laterality: N/A;   CARDIOVERSION N/A 03/13/2014   Procedure: CARDIOVERSION; Location: [facility]; Surgeon: [name], MD   CARDIOVERSION N/A 03/24/2022   Procedure: CARDIOVERSION;  Surgeon: Antonieta Iba, MD;  Location: [facility];  Service: Cardiovascular;  Laterality: N/A;   CARPAL TUNNEL RELEASE Right 12/23/2014   Procedure: CARPAL TUNNEL RELEASE;  Surgeon: Deeann Saint, MD;  Location: [facility];  Service: Orthopedics;  Laterality: Right;   CARPAL TUNNEL RELEASE Left 01/06/2015   Procedure: CARPAL TUNNEL RELEASE;  Surgeon: Deeann Saint, MD;  Location: [facility];  Service: Orthopedics;  Laterality: Left;   COLONOSCOPY WITH PROPOFOL N/A 10/08/2017   Procedure: COLONOSCOPY WITH PROPOFOL;  Surgeon: Toney Reil, MD;  Location: [facility];  Service: Gastroenterology;  Laterality: N/A;   GASTRIC BYPASS  01/17/2000   KNEE SURGERY Right    knee trauma x3   prostate seeding     REVERSE SHOULDER ARTHROPLASTY Right 09/27/2021   Procedure: Right reverse shoulder arthroplasty, biceps tenodesis;  Surgeon: Signa Kell, MD;  Location: [facility]  ORS;  Service: Orthopedics;  Laterality: Right;   SHOULDER ARTHROSCOPY WITH ROTATOR CUFF REPAIR AND OPEN BICEPS TENODESIS Left 12/27/2021   Procedure: Left shoulder arthroscopic cuff repair (supraspinatus and subscapularis) with Regeneten Patch application;  Surgeon: Signa Kell, MD;  Location: [facility];  Service: Orthopedics;  Laterality: Left;   Patient Active Problem List   Diagnosis Date Noted   Chronic pain of both knees 03/30/2022   Bilateral primary osteoarthritis of knee 03/30/2022   Shortness of breath 03/24/2022   S/p reverse total shoulder arthroplasty 09/27/2021   Lesion of bone of lumbosacral spine (L5) 05/12/2021   Localized osteoarthritis of shoulder regions, bilateral 03/29/2021   Chronic pain of both shoulders 03/29/2021   Drug-induced myopathy 02/21/2021   Trigger finger of right hand 11/15/2020   Prostate cancer 07/26/2020   Insomnia 08/01/2019   Primary osteoarthritis of both wrists 02/17/2019   Trigger middle finger of left hand 02/17/2019   Chronic pain syndrome 02/17/2019   Chronic radicular lumbar pain 10/08/2018   Lumbar radiculopathy 10/08/2018   Lumbar degenerative disc disease 10/08/2018   Lumbar facet arthropathy 10/08/2018   Lumbar facet joint syndrome 10/08/2018   Intervertebral disc disorder with radiculopathy of lumbosacral region    Osteoarthritis of knee 11/30/2017   Osteoarthritis of wrist 11/30/2017   Pernicious anemia 05/22/2016   Persistent atrial fibrillation 12/20/2015   Carpal tunnel syndrome 11/16/2014   DJD (degenerative joint disease) of knee 10/27/2014   Bilateral carpal tunnel syndrome 10/27/2014   Morbid obesity with BMI of 50.0-59.9, adult 10/27/2014   Mixed hyperlipidemia 10/27/2014   H/O gastric bypass 03/20/2014   Hyperkalemia 03/20/2014   History of prostate cancer 12/26/2013   Essential hypertension 12/26/2013   Encounter for anticoagulation discussion and counseling 12/26/2013   PCP: Smitty Cords,  DO  REFERRING PROVIDER: Signa Kell, MD  REFERRING DIAGNOSIS: Incomplete tear of left rotator cuff, unspecified whether traumatic M75.112   THERAPY DIAG: Chronic left shoulder pain  Chronic right shoulder pain  Muscle weakness (generalized)  RATIONALE FOR EVALUATION AND TREATMENT: Rehabilitation  ONSET DATE: Surgery 12/27/21, Chronic L shoulder pain for multiple years  FOLLOW UP APPT WITH PROVIDER: Yes    FROM INITIAL EVALUATION SUBJECTIVE:  Chief Complaint: S/p L shoulder RTC repair, biceps tenodesis, and shoulder debridement  Pertinent History Pt with history of chronic bilateral shoulder pain. He underwent right reverse TSR and right biceps tenodesis on 09/27/21. He experienced post-operative instability in R shoulder however it improved and he was able to avoid a revision. On 12/27/21 he underwent left arthroscopic rotator cuff repair (subscapularis (full-thickness), supraspinatus (partial-thickness) side-to-side repair + Regeneten patch), left arthroscopic biceps tenodesis, and left extensive debridement of shoulder (glenohumeral and subacromial spaces). Per surgical note other intraoperative findings include Type 2 SLAP tear, degenerative anterior labral tearing, grade 2 degenerative changes to articular cartilage of humeral head, and significant posterior capsule synovitis. No significant post-operative complications. He arrives without abduction pillow and states that Dr. Posey Pronto advised him he doesn't have to wear the abduction pillow. He got behind on his pain medication today and states that today his shoulder has been the most painful he has experienced since surgery. He also noticed swelling in L lateral upper arm this morning. He did take his pain medication at 14:00, before PT evaluation.  He has a  history of chronic back pain and underwent a lumbar facet, medial branch radiofrequency ablation on 01/02/22. At the time of the evaluation pt has not yet appreciated improvement in his back pain from this procedure. He has a past medical history of Cardiomyopathy (in setting of Afib), DJD (degenerative joint disease) of knee, History of kidney stones, Intervertebral disc disorder with radiculopathy of lumbosacral region, Kidney stone (2012), Lower extremity edema, PAF (paroxysmal atrial fibrillation) ([place]), Pernicious anemia, Prostate cancer ([place]), Pulmonary nodule, right, and SVT (supraventricular tachycardia) ([place]). He also  has a past surgical history that includes Knee surgery; Gastric bypass (2002); prostate seeding; Carpal tunnel release (Right, 12/23/2014); Carpal tunnel release (Left, 01/06/2015); Colonoscopy with propofol (N/A, 10/08/2017); and Cardioversion (N/A, 05/15/2019).   Pain:  Pain Intensity: Present: 5/10, Best: 3/10, Worst: 10/10 Pain location: Anterior L shoulder Pain Quality: aching and throbbing Radiating: No  Numbness/Tingling: No Focal Weakness: Yes History of prior shoulder or neck/shoulder injury, pain, surgery, or therapy: Yes, chronic bilateral shoulder pain with R TSR. 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 No AROM or WB through LUE (per protocol), Passive elevation and ER in staged ROM to begin at week 4 (elevation 90 degrees weeks 4-6 with no ER; 0-120 weeks 6-8 with ER  0-30 degrees). No pulleys or cane until after 6 weeks, recommend no driving x 6-8 weeks.   Weight Bearing Restrictions: Yes No WB through LUE for at least 6 weeks  Living Environment Lives with: lives with their spouse Lives in: House/apartment  Patient Goals: Improve L shoulder strength and pain. Pt would like to be able to play with/lift his grandson as well as take care of his property.   OBJECTIVE:   Patient Surveys  FOTO: To be completed QuickDASH: 47.7%  Cognition Patient is oriented to person, place, and time.  Recent memory is intact.  Remote memory is intact.  Attention span and concentration are intact.  Expressive speech is intact.  Patient's fund of knowledge is within normal limits for educational  level.    Gross Musculoskeletal Assessment Tremor: None Bulk: Mild swelling noted in L lateral upper arm. Pt denies chills, fevers, redness, or dischrage from inicisions Tone: Normal  Posture Forward head and rounded shoulders  AROM Full L wrist AROM flexion, extension, radial deviation, and ulnar deviation. MCP flexion and extension WNL. Unable to assess L shoulder and elbow AROM per protocol  PROM Full L elbow PROM flexion and extension however during first attempt at slow passive extension pt reports sudden pain in L shoulder which eventually improve after a couple minutes of rest. Full L elbow PROM pronation/supination.   LE MMT: Full L wrist strength for flexion, extension, radial deviation, and ulnar deviation. L grip strength is intact and strong. Deferred testing of L elbow and shoulder strength per protocol  Sensation Grossly intact to light touch throughout LUE as determined by testing dermatomes C2-T2. Proprioception and hot/cold testing deferred on this date.  Palpation Pt is generally tender to light palpation around anterior, lateral, and posterior L shoulder;    TODAY'S TREATMENT    SUBJECTIVE: Pt reports his shoulders are doing well today. He  was able to use his riding mower without an increase in shoulder pain however it did worsen his low back pain.   PAIN: Intermittent bilateral shoulder pain;   TREATMENT  Ther-ex: All exercises performed sitting upright position today unless otherwise specified: R shoulder flexion from neutral to 90 degrees with 4# dumbbell (DB) 2 x 8; R shoulder scaption from neutral to around 90 degrees with 4# DB 2 x 8; Seated overhead 4# DB press 2 x 8; R elbow flexion with 5# dumbbell (DB) 2 x 15; R elbow extension with manual resistance 2 x 15; R shoulder internal rotation from forearm vertical to stomach with manual resistance from therapist 2 x 10; R shoulder external rotation from stomach to forearm vertical with 5# DB 2 x 10; L shoulder AROM flexion with straight elbow from neutral to around 90 degrees 2 x 10; L shoulder AROM scaption to around 90 degrees with therapist partially supporting weight of arm 2 x 10; L elbow AROM flexion with light manual resistance 2 x 15; L elbow manually resisted extension 2 x 15; L shoulder IR from vertical to stomach with light manual resistance 2 x 10, no pain reported; L shoulder ER from stomach to vertical with light manual resistance 2 x 10, no pain reported; Ice pack applied to bilateral shoulders at end of session x 5 minutes (unbilled);   Not performed: Seated shoulder extension from 60 flexion to neutral with green tband 2 x 10 BUE; Seated shoulder rows never moving elbows past mid axilla, green tband 2 x 10 BUE;    PATIENT EDUCATION:  Education details:  Pt educated throughout session about proper posture and technique with exercises. Improved exercise technique, movement at target joints, use of target muscles after min to mod verbal, visual, tactile cues. Person educated: Patient Education method: Explanation, verbal cues Education comprehension: verbalized understanding and returned demonstration   HOME EXERCISE PROGRAM: Access Code:  [email or web address] URL: [email or web address] Date: 03/28/2022 Prepared by: Ria CommentJason Callyn Severtson  Exercises - Wrist Flexion AROM  - 1 x daily - 7 x weekly - 2 sets - 10 reps - 3s hold - Wrist Extension AROM  - 1 x daily - 7 x weekly - 2 sets - 10 reps - 3s hold - Seated Scapular Retraction  - 1 x daily - 7 x weekly - 2 sets - 10 reps - 3s hold -  Standing Elbow Flexion Extension AROM  - 1 x daily - 7 x weekly - 2 sets - 10 reps - 3s hold - Standing Shoulder Flexion to 90 Degrees  - 1 x daily - 7 x weekly - 2 sets - 10 reps - 3s hold - Shoulder Abduction - Thumbs Up  - 1 x daily - 7 x weekly - 2 sets - 10 reps - 3s hold   ASSESSMENT:  CLINICAL IMPRESSION: Pt continues making improvement in bilateral shoulder strength and pain. Progressed strengthening today performing exercises in fully upright position today. Continued AROM for L shoulder today against gravity. Pt encouraged to continue HEP and follow-up as scheduled. Plan to progress LUE per protocol as pt is able to tolerate in addition to progressing resistance with RUE strengthening against gravity. He will benefit from skilled PT to address deficits in range of motion and strength in order to improve overall function at home and with leisure activities such as caring for his property.  REHAB POTENTIAL: Good  CLINICAL DECISION MAKING: Evolving/moderate complexity  EVALUATION COMPLEXITY: Moderate   GOALS: Goals reviewed with patient? Yes  SHORT TERM GOALS: Target date: 05/09/2022   Pt will be independent with HEP to improve strength and decrease right shoulder pain to improve pain-free function at home and work. Baseline:  Goal status: ONGOING   LONG TERM GOALS: Target date: 06/20/22;  Pt will increase L shoulder FOTO to at least 51 to demonstrate significant improvement in function at home and with leisure activities related to L shoulder. Baseline: 01/03/22: To be completed; 01/24/22: 4; 02/28/22: 59; 04/11/22: 55; Goal status:  ACHIEVED  2.  Pt will decrease worst L shoulder pain by at least 3 points on the NPRS in order to demonstrate clinically significant reduction in R shoulder pain. Baseline: 01/03/22: worst: 10/10; 02/28/22: 10/10; 04/11/22: 4/10; Goal status: ACHIEVED  3.  Pt will decrease quick DASH score by at least 8% in order to demonstrate clinically significant reduction in disability related to L shoulder pain        Baseline: 01/03/22: 47.7%; 02/28/22: 65.91%; 04/11/22: 56.8%; Goal status: ONGOING  4. Pt will demonstrate L shoulder flexion, abduction, IR, and ER strength of at least 4/5 in order to demonstrate improvement in strength and function       Baseline: 01/03/22: Unable to test per protocol; 02/28/22: At least 3/5 but unable to fully test due to protocol; 04/11/22: flexion: 4/5, abduction: 4-/5 (IR and ER at least 3/5 but not tested); Goal status: PARTIALLY MET  PLAN: PT FREQUENCY: 1-2x/week  PT DURATION: 12 weeks  PLANNED INTERVENTIONS: Therapeutic exercises, Therapeutic activity, Neuromuscular re-education, Balance training, Gait training, Patient/Family education, Joint manipulation, Joint mobilization, Vestibular training, Canalith repositioning, Aquatic Therapy, Dry Needling, Electrical stimulation, Spinal manipulation, Spinal mobilization, Cryotherapy, Moist heat, Traction, Ultrasound, Ionotophoresis 4mg /ml Dexamethasone, and Manual therapy  PLAN FOR NEXT SESSION: Progress L shoulder ROM and strength per protocol, continue R shoulder strengthening   Sharalyn Ink Anni Hocevar PT, DPT, GCS  Physical Therapist- [place]  [facility]

## 2022-04-26 ENCOUNTER — Inpatient Hospital Stay: Payer: Medicare Other | Attending: Internal Medicine | Admitting: Internal Medicine

## 2022-04-26 ENCOUNTER — Inpatient Hospital Stay: Payer: Medicare Other

## 2022-04-26 ENCOUNTER — Encounter: Payer: Self-pay | Admitting: Internal Medicine

## 2022-04-26 VITALS — BP 121/81 | HR 78 | Temp 97.1°F | Resp 18

## 2022-04-26 DIAGNOSIS — I2510 Atherosclerotic heart disease of native coronary artery without angina pectoris: Secondary | ICD-10-CM | POA: Diagnosis not present

## 2022-04-26 DIAGNOSIS — I4891 Unspecified atrial fibrillation: Secondary | ICD-10-CM | POA: Insufficient documentation

## 2022-04-26 DIAGNOSIS — K649 Unspecified hemorrhoids: Secondary | ICD-10-CM | POA: Insufficient documentation

## 2022-04-26 DIAGNOSIS — Z7901 Long term (current) use of anticoagulants: Secondary | ICD-10-CM | POA: Insufficient documentation

## 2022-04-26 DIAGNOSIS — D649 Anemia, unspecified: Secondary | ICD-10-CM | POA: Diagnosis not present

## 2022-04-26 DIAGNOSIS — R5383 Other fatigue: Secondary | ICD-10-CM | POA: Insufficient documentation

## 2022-04-26 LAB — CBC WITH DIFFERENTIAL/PLATELET
Abs Immature Granulocytes: 0.1 10*3/uL — ABNORMAL HIGH (ref 0.00–0.07)
Basophils Absolute: 0.1 10*3/uL (ref 0.0–0.1)
Basophils Relative: 1 %
Eosinophils Absolute: 0.1 10*3/uL (ref 0.0–0.5)
Eosinophils Relative: 1 %
HCT: 36.9 % — ABNORMAL LOW (ref 39.0–52.0)
Hemoglobin: 10.7 g/dL — ABNORMAL LOW (ref 13.0–17.0)
Immature Granulocytes: 1 %
Lymphocytes Relative: 34 %
Lymphs Abs: 2.6 10*3/uL (ref 0.7–4.0)
MCH: 20.5 pg — ABNORMAL LOW (ref 26.0–34.0)
MCHC: 29 g/dL — ABNORMAL LOW (ref 30.0–36.0)
MCV: 70.7 fL — ABNORMAL LOW (ref 80.0–100.0)
Monocytes Absolute: 0.7 10*3/uL (ref 0.1–1.0)
Monocytes Relative: 9 %
Neutro Abs: 4.1 10*3/uL (ref 1.7–7.7)
Neutrophils Relative %: 54 %
Platelets: 260 10*3/uL (ref 150–400)
RBC: 5.22 MIL/uL (ref 4.22–5.81)
RDW: 18.2 % — ABNORMAL HIGH (ref 11.5–15.5)
WBC: 7.6 10*3/uL (ref 4.0–10.5)
nRBC: 0 % (ref 0.0–0.2)

## 2022-04-26 LAB — COMPREHENSIVE METABOLIC PANEL
ALT: 13 U/L (ref 0–44)
AST: 19 U/L (ref 15–41)
Albumin: 4.1 g/dL (ref 3.5–5.0)
Alkaline Phosphatase: 68 U/L (ref 38–126)
Anion gap: 5 (ref 5–15)
BUN: 18 mg/dL (ref 8–23)
CO2: 24 mmol/L (ref 22–32)
Calcium: 8.6 mg/dL — ABNORMAL LOW (ref 8.9–10.3)
Chloride: 109 mmol/L (ref 98–111)
Creatinine, Ser: 1.13 mg/dL (ref 0.61–1.24)
GFR, Estimated: >60 mL/min (ref >60)
Glucose, Bld: 163 mg/dL — ABNORMAL HIGH (ref 70–99)
Potassium: 4.4 mmol/L (ref 3.5–5.1)
Sodium: 138 mmol/L (ref 135–145)
Total Bilirubin: 0.5 mg/dL (ref 0.3–1.2)
Total Protein: 6.8 g/dL (ref 6.5–8.1)

## 2022-04-26 LAB — TECHNOLOGIST SMEAR REVIEW: Plt Morphology: ADEQUATE

## 2022-04-26 LAB — RETICULOCYTES
Immature Retic Fract: 16.1 % — ABNORMAL HIGH (ref 2.3–15.9)
RBC.: 5.19 MIL/uL (ref 4.22–5.81)
Retic Count, Absolute: 47.7 10*3/uL (ref 19.0–186.0)
Retic Ct Pct: 0.9 % (ref 0.4–3.1)

## 2022-04-26 LAB — FERRITIN: Ferritin: 6 ng/mL — ABNORMAL LOW (ref 24–336)

## 2022-04-26 LAB — FOLATE: Folate: 29 ng/mL (ref >5.9)

## 2022-04-26 LAB — VITAMIN B12: Vitamin B-12: 486 pg/mL (ref 180–914)

## 2022-04-26 LAB — LACTATE DEHYDROGENASE: LDH: 125 U/L (ref 98–192)

## 2022-04-26 NOTE — Progress Notes (Signed)
Patient states he has had blood in his stool for the past three days, he was told it was hemorrhoids but feels that is something different.  He has loose stool every week with some constipation for four days.     Chronic degenerative bone disease.   Back and left leg are at a level 5 pain. He is getting treatment at this time with an outside source     Patients gait is unsteady at times and can not stand for long periods of times or long distances.

## 2022-04-26 NOTE — Assessment & Plan Note (Addendum)
#   Anemia- Hb-symptomatic.  Likely due to iron deficiency - from etiology GI blood loss/ vs malabsorption/Hx of gastric bypass.  Given the symptomatic discussed regarding IV iron infusion/Venofer. Discussed the potential acute infusion reactions with IV iron; which are quite rare.  Patient understands the risk; will proceed with infusions.   # Recommend CBC CMP LDH peripheral smear; haptoglobin; iron studies ferritin B12 folic acid; zinc copper.  #Etiology of iron deficiency: Malabsorption/gastric bypass. Incidental CT February 2024-  possible mucosal lesion at the distal small bowel anastomosis status post gastric bypass. ? EGD- discussed with Dr.Vanga.   # CAD/A.fib-on Xarelto/amiodarone.  Monitor closely- ?  Hemorrhoidal bleeding.   Thank you Dr. Meta Hatchet for allowing me to participate in the care of your pleasant patient. Please do not hesitate to contact me with questions or concerns in the interim.  # DISPOSITION: # labs today-CBC CMP iron studies B12; zinc, copper # venofer weekly x 4- asap.  # follow up in 2  months- MD: labs- cbc;possible venofer-Dr.B

## 2022-04-26 NOTE — Progress Notes (Signed)
[facility] CONSULT NOTE  Patient Care Team: Smitty Cords, DO as PCP - General (Family Medicine) Mariah Milling Tollie Pizza, MD as PCP - Cardiology (Cardiology)  CHIEF COMPLAINTS/PURPOSE OF CONSULTATION: ANEMIA   HEMATOLOGY HISTORY  # ANEMIA[Hb; MCV-platelets- WBC; Iron sat; ferritin;  GFR- CT/US- ;  EGD- none/colonoscopy-2019 [Dr.Vanga]  # 2005- Gastric by pass [BL-400; 233; [facility]]  # Prostate cancer [seed implant]- no surgery/ Dr.Wolfe.    Latest Reference Range & Units 03/22/22 11:59  WBC 4.0 - 10.5 K/uL 7.4  RBC 4.22 - 5.81 MIL/uL 5.03  Hemoglobin 13.0 - 17.0 g/dL 16.1 (L)  HCT 09.6 - 04.5 % 35.1 (L)  MCV 80.0 - 100.0 fL 69.8 (L)  MCH 26.0 - 34.0 pg 20.5 (L)  MCHC 30.0 - 36.0 g/dL 40.9 (L)  RDW 81.1 - 91.4 % 17.8 (H)  Platelets 150 - 400 K/uL 250  (L): Data is abnormally low (H): Data is abnormally high  HISTORY OF PRESENTING ILLNESS:  [name] 67 y.o.  male pleasant patient with Hx of prostate cancer was been referred to [place] for further evaluation of anemia.  Patient states he has had blood in his stool for the past three days, he was told it was hemorrhoids but feels that is something different.  He has loose stool every week with some constipation for four days.     Patients gait is unsteady at times and can not stand for long periods of times or long distances.   Blood in stools: colonoscopy 2019 [Dr.Vanga; again sep 2024]; EGD-  Blood in urine: none Difficulty swallowing: dry mouth- intermittent  Change of bowel movement/constipation:none Prior blood transfusion:none Liver disease: none Alcohol:  Bariatric surgery: in 2005.   Prior evaluation with hematology: none  Prior bone marrow biopsy: none  Oral iron: on PO iron last- week.  Prior IV iron infusions: none   Review of Systems  Constitutional:  Positive for malaise/fatigue. Negative for chills, diaphoresis, fever and weight loss.  HENT:  Negative for nosebleeds and sore throat.    Eyes:  Negative for double vision.  Respiratory:  Positive for shortness of breath. Negative for cough, hemoptysis, sputum production and wheezing.   Cardiovascular:  Negative for chest pain, palpitations, orthopnea and leg swelling.  Gastrointestinal:  Positive for blood in stool and diarrhea. Negative for abdominal pain, constipation, heartburn, melena, nausea and vomiting.  Genitourinary:  Negative for dysuria, frequency and urgency.  Musculoskeletal:  Positive for back pain and joint pain.  Skin: Negative.  Negative for itching and rash.  Neurological:  Negative for dizziness, tingling, focal weakness, weakness and headaches.  Endo/Heme/Allergies:  Does not bruise/bleed easily.  Psychiatric/Behavioral:  Negative for depression. The patient is not nervous/anxious and does not have insomnia.     MEDICAL HISTORY:  Past Medical History:  Diagnosis Date   Anxiety    Aortic atherosclerosis    Cardiomyopathy (in setting of Afib)    a.) TTE 12/26/2013: EF 45-50%, mild ant and antsept HK. mild MR. Mod dil LA. nl RV fxn. Rhythm was Afib; b.) TTE 07/04/2019: EF 55%, mid-apical anteroseptal HK, mild MAC, mild Ao sclerosis, G2DD, RVSP 45.3   Chronic pain syndrome    a.) followed by pain management   Chronic, continuous use of opioids    a.) hydrocodone/APAP 7.5/325 mg; followed by pain management   Coronary artery disease 11/06/2014   a.) cCTA 11/06/2014: Ca score 224 (all in pLAD) -- 74th percentile for age/sex matched control   Diverticulosis  DJD (degenerative joint disease) of knee    History of hiatal hernia    History of kidney stones 2012   Hyperlipidemia    Hyperplastic colon polyp    Hypertension    Internal hemorrhoids    Intervertebral disc disorder with radiculopathy of lumbosacral region    Long term current use FULL DOSE (325 mg) aspirin    Long term current use of amiodarone    Long term current use of antithrombotics/antiplatelets    a.) rivaroxaban   Lower extremity  edema    Mixed hyperlipidemia    Myalgia due to statin    Osteoarthritis    PAF (paroxysmal atrial fibrillation)    a.) CHA2DS2VASc = 4 (age, HTN, CHF, vascular disease history);  b.) s/p DCCV 03/13/2014 (200 J x1); c.) s/p DCCV 05/15/2019 (150 J x 1, 200 J x2); d.) rate/rhythm maintained on oral amiodarone + metoprolol succinate; chronically anticoagulated with rivaroxaban   Pernicious anemia    Prostate cancer    Pulmonary nodule, right    a. 10/2014 Cardiac CTA: 7mm RLL nodule; b. 04/2015 CT Chest: stable 7mm RLL nodule. No new nodules; 02/2017 CTA Chest: stable, benign, 7mm RLL pulm nodule.   Sleep difficulties    a.) takes melatonin + trazodone PRN   SVT (supraventricular tachycardia)    Tubular adenoma of colon     SURGICAL HISTORY: Past Surgical History:  Procedure Laterality Date   BICEPT TENODESIS Right 09/27/2021   Procedure: Right reverse shoulder arthroplasty, biceps tenodesis;  Surgeon: Signa KellPatel, Sunny, MD;  Location: [facility];  Service: Orthopedics;  Laterality: Right;   CARDIOVERSION N/A 05/15/2019   Procedure: CARDIOVERSION;  Surgeon: Antonieta IbaGollan, Timothy J, MD;  Location: [facility];  Service: Cardiovascular;  Laterality: N/A;   CARDIOVERSION N/A 03/13/2014   Procedure: CARDIOVERSION; Location: [facility]; Surgeon: Julien Nordmannimothy Gollan, MD   CARDIOVERSION N/A 03/24/2022   Procedure: CARDIOVERSION;  Surgeon: Antonieta IbaGollan, Timothy J, MD;  Location: [facility];  Service: Cardiovascular;  Laterality: N/A;   CARPAL TUNNEL RELEASE Right 12/23/2014   Procedure: CARPAL TUNNEL RELEASE;  Surgeon: Deeann SaintHoward Miller, MD;  Location: [facility];  Service: Orthopedics;  Laterality: Right;   CARPAL TUNNEL RELEASE Left 01/06/2015   Procedure: CARPAL TUNNEL RELEASE;  Surgeon: Deeann SaintHoward Miller, MD;  Location: [facility];  Service: Orthopedics;  Laterality: Left;   COLONOSCOPY WITH PROPOFOL N/A 10/08/2017   Procedure: COLONOSCOPY WITH PROPOFOL;  Surgeon: Toney ReilVanga, Rohini Reddy, MD;  [facility];  Service:  Gastroenterology;  Laterality: N/A;   GASTRIC BYPASS  01/17/2000   KNEE SURGERY Right    knee trauma x3   prostate seeding     REVERSE SHOULDER ARTHROPLASTY Right 09/27/2021   Procedure: Right reverse shoulder arthroplasty, biceps tenodesis;  Surgeon: Signa KellPatel, Sunny, MD;  Location: [facility];  Service: Orthopedics;  Laterality: Right;   SHOULDER ARTHROSCOPY WITH ROTATOR CUFF REPAIR AND OPEN BICEPS TENODESIS Left 12/27/2021   Procedure: Left shoulder arthroscopic cuff repair (supraspinatus and subscapularis) with Regeneten Patch application;  Surgeon: Signa KellPatel, Sunny, MD;  Location: [facility];  Service: Orthopedics;  Laterality: Left;    SOCIAL HISTORY: Social History   Socioeconomic History   Marital status: Married    Spouse name: Nelva Bushorma   Number of children: Not on file   Years of education: Not on file   Highest education level: Not on file  Occupational History   Not on file  Tobacco Use   Smoking status: Former    Packs/day: 1.00    Years: 10.00    Additional pack years: 0.00  Total pack years: 10.00    Types: Cigarettes    Quit date: 02/18/1979    Years since quitting: 43.2   Smokeless tobacco: Former    Types: Snuff  Vaping Use   Vaping Use: Never used  Substance and Sexual Activity   Alcohol use: Not Currently    Comment: last drink in 2021   Drug use: No   Sexual activity: Not on file  Other Topics Concern   Not on file  Social History Narrative   Lives in [place]; with wife. Retd from [employer]; quit in 1980; used to drink alcohol; no beer in last 2022.    Social Determinants of Health   Financial Resource Strain: Low Risk  (02/24/2022)   Overall Financial Resource Strain (CARDIA)    Difficulty of Paying Living Expenses: Not hard at all  Food Insecurity: No Food Insecurity (04/26/2022)   Hunger Vital Sign    Worried About Running Out of Food in the Last Year: Never true    Ran Out of Food in the Last Year: Never true  Transportation Needs: No Transportation  Needs (02/24/2022)   PRAPARE - Administrator, Civil Service (Medical): No    Lack of Transportation (Non-Medical): No  Physical Activity: Insufficiently Active (02/24/2022)   Exercise Vital Sign    Days of Exercise per Week: 2 days    Minutes of Exercise per Session: 60 min  Stress: No Stress Concern Present (02/24/2022)   [employer] of Occupational Health - Occupational Stress Questionnaire    Feeling of Stress : Not at all  Social Connections: Moderately Integrated (02/24/2022)   Social Connection and Isolation Panel [NHANES]    Frequency of Communication with Friends and Family: More than three times a week    Frequency of Social Gatherings with Friends and Family: More than three times a week    Attends Religious Services: More than 4 times per year    Active Member of [employer] or Organizations: No    Attends Banker Meetings: Never    Marital Status: Married  Catering manager Violence: Not At Risk (04/26/2022)   Humiliation, Afraid, Rape, and Kick questionnaire    Fear of Current or Ex-Partner: No    Emotionally Abused: No    Physically Abused: No    Sexually Abused: No    FAMILY HISTORY: Family History  Problem Relation Age of Onset   Heart disease Father    Hypertension Father    Stroke Father    Cancer Father        prostate cancer   Cancer Sister    Arrhythmia Sister        A-fib   Arrhythmia Brother        A-fib   Cancer Brother     ALLERGIES:  is allergic to bupivacaine liposome.  MEDICATIONS:  Current Outpatient Medications  Medication Sig Dispense Refill   amiodarone (PACERONE) 200 MG tablet Take 1 tablet (200 mg total) by mouth 2 (two) times daily. 180 tablet 1   aspirin EC 325 MG tablet Take 325 mg by mouth daily.     benazepril (LOTENSIN) 40 MG tablet TAKE ONE TABLET BY MOUTH ONE TIME DAILY (Patient taking differently: Take 40 mg by mouth every morning. TAKE ONE TABLET BY MOUTH ONE TIME DAILY) 90 tablet 3   busPIRone (BUSPAR) 10  MG tablet TAKE ONE TABLET BY MOUTH FOUR TIMES A DAY AS NEEDED FOR ANXIETY 360 tablet 1   Calcium-Magnesium-Vitamin D (OPURITY CALCIUM CITRATE PLUS)  300-20-200 MG-MG-UNIT CHEW Chew 2 tablets by mouth daily. Silver leaf brand 300 Mg supplement     carboxymethylcellulose (REFRESH PLUS) 0.5 % SOLN 1 drop 3 (three) times daily as needed.     CHIA SEED PO Take 2 capsules by mouth daily. 750 mg supplement  2 by mouth in the morning.     cyanocobalamin (VITAMIN B12) 1000 MCG/ML injection Inject 1 mL (1,000 mcg total) into the skin every 30 (thirty) days. INJECT EVERY 30 DAYS AS DIRECTED 1 mL 99   cyclobenzaprine (FLEXERIL) 10 MG tablet TAKE ONE TABLET BY MOUTH THREE TIMES A DAY AS NEEDED FOR MUSCLE SPASM 90 tablet 3   DULoxetine (CYMBALTA) 30 MG capsule TAKE ONE CAPSULE BY MOUTH ONE TIME DAILY 90 capsule 1   ferrous sulfate 325 (65 FE) MG EC tablet Take 325 mg by mouth 3 (three) times daily with meals.     [START ON 05/21/2022] HYDROcodone-acetaminophen (NORCO) 7.5-325 MG tablet Take 1 tablet by mouth every 6 (six) hours as needed for severe pain. Must last 30 days 120 tablet 0   [START ON 06/20/2022] HYDROcodone-acetaminophen (NORCO) 7.5-325 MG tablet Take 1 tablet by mouth every 6 (six) hours as needed for severe pain. Must last 30 days 120 tablet 0   [START ON 07/20/2022] HYDROcodone-acetaminophen (NORCO) 7.5-325 MG tablet Take 1 tablet by mouth every 6 (six) hours as needed for severe pain. Must last 30 days 120 tablet 0   melatonin 3 MG TABS tablet Take 3 mg by mouth at bedtime.     metoprolol succinate (TOPROL-XL) 50 MG 24 hr tablet TAKE ONE TABLET BY MOUTH ONE TIME DAILY WITH OR IMMEDIATELY FOLLOWING A MEAL 90 tablet 2   Multiple Vitamin (MULTIVITAMIN WITH MINERALS) TABS tablet Take 1 tablet by mouth daily.     rivaroxaban (XARELTO) 20 MG TABS tablet TAKE ONE TABLET BY MOUTH ONE TIME DAILY WITH SUPPER 90 tablet 1   traZODone (DESYREL) 150 MG tablet TAKE ONE TABLET BY MOUTH AT BEDTIME 90 tablet 1    Vitamin D, Ergocalciferol, (DRISDOL) 1.25 MG (50000 UNIT) CAPS capsule Take 50,000 Units by mouth once a week.     tamsulosin (FLOMAX) 0.4 MG CAPS capsule Take 0.4 mg by mouth daily. (Patient not taking: Reported on 04/26/2022)     No current facility-administered medications for this visit.     PHYSICAL EXAMINATION:   Vitals:   04/26/22 1149  BP: 121/81  Pulse: 78  Resp: 18  Temp: (!) 97.1 F (36.2 C)  SpO2: 97%   There were no vitals filed for this visit.  Physical Exam Vitals and nursing note reviewed.  HENT:     Head: Normocephalic and atraumatic.     Mouth/Throat:     Pharynx: Oropharynx is clear.  Eyes:     Extraocular Movements: Extraocular movements intact.     Pupils: Pupils are equal, round, and reactive to light.  Cardiovascular:     Rate and Rhythm: Normal rate and regular rhythm.  Pulmonary:     Comments: Decreased breath sounds bilaterally.  Abdominal:     Palpations: Abdomen is soft.  Musculoskeletal:        General: Normal range of motion.     Cervical back: Normal range of motion.  Skin:    General: Skin is warm.  Neurological:     General: No focal deficit present.     Mental Status: He is alert and oriented to person, place, and time.  Psychiatric:  Behavior: Behavior normal.        Judgment: Judgment normal.      LABORATORY DATA:  I have reviewed the data as listed Lab Results  Component Value Date   WBC 7.6 04/26/2022   HGB 10.7 (L) 04/26/2022   HCT 36.9 (L) 04/26/2022   MCV 70.7 (L) 04/26/2022   PLT 260 04/26/2022   Recent Labs    07/27/21 0801 09/21/21 1113 09/28/21 0453 03/22/22 1159 04/26/22 1400  NA 142  --  141 139 138  K 5.0   < > 4.3 4.4 4.4  CL 107  --  111 109 109  CO2 27  --  27 24 24   GLUCOSE 98  --  104* 113* 163*  BUN 15  --  16 14 18   CREATININE 1.13  --  0.96 0.97 1.13  CALCIUM 9.3  --  8.1* 8.7* 8.6*  GFRNONAA  --   --  >60 >60 >60  PROT 6.3  --   --   --  6.8  ALBUMIN  --   --   --   --  4.1  AST  12  --   --   --  19  ALT 10  --   --   --  13  ALKPHOS  --   --   --   --  68  BILITOT 0.4  --   --   --  0.5   < > = values in this interval not displayed.     No results found.  ASSESSMENT & PLAN:   Symptomatic anemia # Anemia- Hb-symptomatic.  Likely due to iron deficiency - from etiology GI blood loss/ vs malabsorption/Hx of gastric bypass.  Given the symptomatic discussed regarding IV iron infusion/Venofer. Discussed the potential acute infusion reactions with IV iron; which are quite rare.  Patient understands the risk; will proceed with infusions.   # Recommend CBC CMP LDH peripheral smear; haptoglobin; iron studies ferritin B12 folic acid; zinc copper.  #Etiology of iron deficiency: Malabsorption/gastric bypass. Incidental CT February 2024-  possible mucosal lesion at the distal small bowel anastomosis status post gastric bypass. ? EGD- discussed with Dr.Vanga.   # CAD/A.fib-on Xarelto/amiodarone.  Monitor closely- ?  Hemorrhoidal bleeding.   Thank you Dr. Meta Hatchet for allowing me to participate in the care of your pleasant patient. Please do not hesitate to contact me with questions or concerns in the interim.  # DISPOSITION: # labs today-CBC CMP iron studies B12; zinc, copper # venofer weekly x 4- asap.  # follow up in 2  months- MD: labs- cbc;possible venofer-Dr.B  All questions were answered. The patient knows to call the clinic with any problems, questions or concerns.  Earna Coder, MD 04/26/2022 3:45 PM

## 2022-04-27 ENCOUNTER — Ambulatory Visit: Payer: Medicare Other

## 2022-04-27 ENCOUNTER — Telehealth: Payer: Self-pay | Admitting: Student in an Organized Health Care Education/Training Program

## 2022-04-27 ENCOUNTER — Telehealth: Payer: Self-pay

## 2022-04-27 NOTE — Telephone Encounter (Signed)
Made appointment for patient on 05/15/2022

## 2022-04-27 NOTE — Telephone Encounter (Signed)
Called patient and informed him that it was OK to go ahead with the procedure.

## 2022-04-27 NOTE — Telephone Encounter (Signed)
PT will be having a procedure done on 04-28-22 for iron infusion. PT just wanted to see if he will be okay to still come in on 05-01-22 to get procedure done. Please give patient a call. TY

## 2022-04-27 NOTE — Telephone Encounter (Signed)
-----   Message from Toney Reil, MD sent at 04/26/2022  5:00 PM EDT ----- Regarding: New patient appointment [name]  Please schedule appointment with me Dx: Small bowel lesion based on the CT scan, referred by Dr. Donneta Hill Also, he is due for his surveillance colonoscopy  [name]

## 2022-04-28 ENCOUNTER — Inpatient Hospital Stay: Payer: Medicare Other

## 2022-04-28 VITALS — BP 138/82 | HR 64 | Temp 97.0°F | Resp 17

## 2022-04-28 DIAGNOSIS — Z7901 Long term (current) use of anticoagulants: Secondary | ICD-10-CM | POA: Diagnosis not present

## 2022-04-28 DIAGNOSIS — D649 Anemia, unspecified: Secondary | ICD-10-CM

## 2022-04-28 DIAGNOSIS — I2510 Atherosclerotic heart disease of native coronary artery without angina pectoris: Secondary | ICD-10-CM | POA: Diagnosis not present

## 2022-04-28 DIAGNOSIS — R5383 Other fatigue: Secondary | ICD-10-CM | POA: Diagnosis not present

## 2022-04-28 DIAGNOSIS — K649 Unspecified hemorrhoids: Secondary | ICD-10-CM | POA: Diagnosis not present

## 2022-04-28 DIAGNOSIS — I4891 Unspecified atrial fibrillation: Secondary | ICD-10-CM | POA: Diagnosis not present

## 2022-04-28 LAB — HAPTOGLOBIN: Haptoglobin: 136 mg/dL (ref 32–363)

## 2022-04-28 LAB — ZINC: Zinc: 81 ug/dL (ref 44–115)

## 2022-04-28 LAB — COPPER, SERUM: Copper: 109 ug/dL (ref 69–132)

## 2022-04-28 MED ORDER — SODIUM CHLORIDE 0.9 % IV SOLN
Freq: Once | INTRAVENOUS | Status: AC
  Filled 2022-04-28: qty 250

## 2022-04-28 MED ORDER — SODIUM CHLORIDE 0.9% FLUSH
10.0000 mL | Freq: Once | INTRAVENOUS | Status: AC | PRN
  Administered 2022-04-28: 10 mL
  Filled 2022-04-28: qty 10

## 2022-04-28 MED ORDER — SODIUM CHLORIDE 0.9 % IV SOLN
200.0000 mg | Freq: Once | INTRAVENOUS | Status: AC
  Administered 2022-04-28: 200 mg via INTRAVENOUS
  Filled 2022-04-28: qty 200

## 2022-04-28 NOTE — Patient Instructions (Signed)

## 2022-04-28 NOTE — Progress Notes (Signed)
Patient tolerated infusion well, no questions/concerns voiced. Monitored 30 min post transfusion. Patient stable at discharge. VSS. AVS given.    

## 2022-05-01 ENCOUNTER — Ambulatory Visit
Admission: RE | Admit: 2022-05-01 | Discharge: 2022-05-01 | Disposition: A | Discharge status: Home / Self Care | Payer: Medicare Other | Source: Ambulatory Visit | Attending: Student in an Organized Health Care Education/Training Program | Admitting: Student in an Organized Health Care Education/Training Program

## 2022-05-01 ENCOUNTER — Ambulatory Visit
Payer: Medicare Other | Attending: Student in an Organized Health Care Education/Training Program | Admitting: Student in an Organized Health Care Education/Training Program

## 2022-05-01 ENCOUNTER — Encounter: Payer: Self-pay | Admitting: Student in an Organized Health Care Education/Training Program

## 2022-05-01 VITALS — BP 123/100 | HR 85 | Temp 97.1°F | Resp 16 | Ht 72.0 in | Wt 333.0 lb

## 2022-05-01 DIAGNOSIS — M47816 Spondylosis without myelopathy or radiculopathy, lumbar region: Secondary | ICD-10-CM | POA: Insufficient documentation

## 2022-05-01 DIAGNOSIS — G894 Chronic pain syndrome: Secondary | ICD-10-CM | POA: Insufficient documentation

## 2022-05-01 DIAGNOSIS — M170 Bilateral primary osteoarthritis of knee: Secondary | ICD-10-CM | POA: Diagnosis not present

## 2022-05-01 DIAGNOSIS — M25562 Pain in left knee: Secondary | ICD-10-CM | POA: Insufficient documentation

## 2022-05-01 DIAGNOSIS — G8929 Other chronic pain: Secondary | ICD-10-CM

## 2022-05-01 DIAGNOSIS — M25561 Pain in right knee: Secondary | ICD-10-CM | POA: Insufficient documentation

## 2022-05-01 MED ORDER — DEXAMETHASONE SODIUM PHOSPHATE 10 MG/ML IJ SOLN
10.0000 mg | Freq: Once | INTRAMUSCULAR | Status: AC
  Administered 2022-05-01: 10 mg
  Filled 2022-05-01: qty 1

## 2022-05-01 MED ORDER — LIDOCAINE HCL 2 % IJ SOLN
20.0000 mL | Freq: Once | INTRAMUSCULAR | Status: AC
  Administered 2022-05-01: 400 mg
  Filled 2022-05-01: qty 20

## 2022-05-01 MED ORDER — DIAZEPAM 5 MG PO TABS
ORAL_TABLET | ORAL | Status: AC
  Filled 2022-05-01: qty 1

## 2022-05-01 MED ORDER — DIAZEPAM 5 MG PO TABS
10.0000 mg | ORAL_TABLET | ORAL | Status: AC
  Administered 2022-05-01: 10 mg via ORAL

## 2022-05-01 MED ORDER — SODIUM HYALURONATE (VISCOSUP) 16.8 MG/2ML IX SOSY
16.8000 mg | PREFILLED_SYRINGE | Freq: Once | INTRA_ARTICULAR | Status: AC
  Administered 2022-05-01: 16.8 mg via INTRA_ARTICULAR
  Filled 2022-05-01: qty 2

## 2022-05-01 MED ORDER — ROPIVACAINE HCL 2 MG/ML IJ SOLN
9.0000 mL | Freq: Once | INTRAMUSCULAR | Status: AC
  Administered 2022-05-01: 9 mL via PERINEURAL

## 2022-05-01 MED ORDER — DEXAMETHASONE SODIUM PHOSPHATE 10 MG/ML IJ SOLN
10.0000 mg | Freq: Once | INTRAMUSCULAR | Status: AC
  Administered 2022-05-01: 10 mg

## 2022-05-01 MED ORDER — SODIUM HYALURONATE (VISCOSUP) 16.8 MG/2ML IX SOSY
16.8000 mg | PREFILLED_SYRINGE | Freq: Once | INTRA_ARTICULAR | Status: AC
  Administered 2022-05-01: 16.8 mg via INTRA_ARTICULAR

## 2022-05-01 NOTE — Progress Notes (Signed)
PROVIDER NOTE: Interpretation of information contained herein should be left to medically-trained personnel. Specific patient instructions are provided elsewhere under "Patient Instructions" section of medical record. This document was created in part using STT-dictation technology, any transcriptional errors that may result from this process are unintentional.  Patient: [name] Type: Established DOB: 1955-06-03 MRN: [id number] PCP: Smitty Cords, DO  Service: Procedure DOS: 05/01/2022 Setting: Ambulatory Location: Ambulatory outpatient facility Delivery: Face-to-face Provider: Edward Jolly, MD Specialty: Interventional Pain Management Specialty designation: 09 Location: Outpatient facility Ref. Prov.: Edward Jolly, MD       Interventional Therapy   Procedure:           Type: Gelsyn-3 Intra-articular Knee Injection          Laterality: Bilateral (-50) Level/approach: Medial Imaging guidance: None required ([id number]) Anesthesia: Local anesthesia (1-2% Lidocaine) Anxiolysis: Valium 10 mg PO DOS: 05/01/2022  Performed by: Edward Jolly, MD  Purpose: Diagnostic/Therapeutic Indications: Knee arthralgia associated to osteoarthritis of the knee 1. Chronic pain of both knees   2. Bilateral primary osteoarthritis of knee   3. Chronic pain syndrome    NAS-11 score:   Pre-procedure: 6  (knee 5/10)/10   Post-procedure: 6  (knee 5/10)/10     Pre-Procedure Preparation  Monitoring: As per clinic protocol.  Risk Assessment: Vitals:  [id number] body mass index is 45.16 kg/m as calculated from the following:   Height as of this encounter: 6' (1.829 m).   Weight as of this encounter: 333 lb (151 kg)., Rate:85 , BP:130/88, Resp:16, Temp:(!) 97.1 F (36.2 C), SpO2:99 %  Allergies: He is allergic to bupivacaine liposome.  Precautions: No additional precautions required  Blood-thinner(s): None at this time  Coagulopathies: Reviewed. None identified.   Active Infection(s):  Reviewed. None identified. Mr. Harvill is afebrile   Location setting: Exam room Position: Sitting w/ knee bent 90 degrees Safety Precautions: Patient was assessed for positional comfort and pressure points before starting the procedure. Prepping solution: DuraPrep (Iodine Povacrylex [0.7% available iodine] and Isopropyl Alcohol, 74% w/w) Prep Area: Entire knee region Approach: percutaneous, just above the tibial plateau, lateral to the infrapatellar tendon. Intended target: Intra-articular knee space Materials: Tray: Block Needle(s): Regular Qty: 1/side Length: 1.5-inch Gauge: 25G (x1) + 22G (x1)  Meds ordered this encounter  Medications   lidocaine (XYLOCAINE) 2 % (with pres) injection 400 mg   diazepam (VALIUM) tablet 10 mg    Make sure Flumazenil is available in the pyxis when using this medication. If oversedation occurs, administer 0.2 mg IV over 15 sec. If after 45 sec no response, administer 0.2 mg again over 1 min; may repeat at 1 min intervals; not to exceed 4 doses (1 mg)   dexamethasone (DECADRON) injection 10 mg   ropivacaine (PF) 2 mg/mL (0.2%) (NAROPIN) injection 9 mL   sodium hyaluronate (viscosup) (GELSYN-3) intra-articular injection 16.8 mg    Do not substitute. Deliver to facility day before procedure.   dexamethasone (DECADRON) injection 10 mg   sodium hyaluronate (viscosup) (GELSYN-3) intra-articular injection 16.8 mg    Do not substitute. Deliver to facility day before procedure.    Orders Placed This Encounter  Procedures   DG PAIN CLINIC C-ARM 1-60 MIN NO REPORT    Intraoperative interpretation by procedural physician at [facility].    Standing Status:   Standing    Number of Occurrences:   1    Order Specific Question:   Reason for exam:    Answer:   Assistance in needle guidance and placement  for procedures requiring needle placement in or near specific anatomical locations not easily accessible without such assistance.     Time-out: 1252 I  initiated and conducted the "Time-out" before starting the procedure, as per protocol. The patient was asked to participate by confirming the accuracy of the "Time Out" information. Verification of the correct person, site, and procedure were performed and confirmed by me, the nursing staff, and the patient. "Time-out" conducted as per Joint Commission's Universal Protocol (UP.01.01.01). Procedure checklist: Completed  H&P (Pre-op  Assessment)  [name] is a 67 y.o. (year old), male patient, seen today for interventional treatment. He  has a past surgical history that includes Knee surgery (Right); Gastric bypass (01/17/2000); prostate seeding; Carpal tunnel release (Right, 12/23/2014); Carpal tunnel release (Left, 01/06/2015); Colonoscopy with propofol (N/A, 10/08/2017); Cardioversion (N/A, 05/15/2019); Cardioversion (N/A, 03/13/2014); Reverse shoulder arthroplasty (Right, 09/27/2021); Bicept tenodesis (Right, 09/27/2021); Shoulder arthroscopy with rotator cuff repair and open biceps tenodesis (Left, 12/27/2021); and Cardioversion (N/A, 03/24/2022). Mr. Hagen has a current medication list which includes the following prescription(s): amiodarone, aspirin ec, benazepril, buspirone, opurity calcium citrate plus, carboxymethylcellulose, chia seed, cyanocobalamin, cyclobenzaprine, duloxetine, ferrous sulfate, [START ON 05/21/2022] hydrocodone-acetaminophen, [START ON 06/20/2022] hydrocodone-acetaminophen, [START ON 07/20/2022] hydrocodone-acetaminophen, melatonin, metoprolol succinate, multivitamin with minerals, xarelto, tamsulosin, trazodone, and vitamin d (ergocalciferol), and the following Facility-Administered Medications: dexamethasone, dexamethasone, and sodium hyaluronate (viscosup). His primarily concern today is the No chief complaint on file.  He is allergic to bupivacaine liposome.   Last encounter: My last encounter with him was on 04/27/2022. Pertinent problems: Mr. Heitner has H/O gastric bypass; DJD  (degenerative joint disease) of knee; Bilateral carpal tunnel syndrome; Persistent atrial fibrillation; Chronic radicular lumbar pain; Lumbar radiculopathy; Lumbar degenerative disc disease; Lumbar facet arthropathy; Lumbar facet joint syndrome; Primary osteoarthritis of both wrists; Trigger middle finger of left hand; and Chronic pain syndrome on their pertinent problem list. Pain Assessment: Severity of   is reported as a 6  (knee 5/10)/10. Location:    / . Onset:  . Quality:  . Timing:  . Modifying factor(s):  Marland Kitchen Vitals:  height is 6' (1.829 m) and weight is 333 lb (151 kg) (abnormal). His temperature is 97.1 F (36.2 C) (abnormal). His blood pressure is 130/88 and his pulse is 85. His respiration is 16 and oxygen saturation is 99%.   Reason for encounter: "interventional pain management therapy due pain of at least four (4) weeks in duration, with failure to respond and/or inability to tolerate more conservative care.  Site Confirmation: Mr. Abdalla was asked to confirm the procedure and laterality before marking the site.  Consent: Before the procedure and under the influence of no sedative(s), amnesic(s), or anxiolytics, the patient was informed of the treatment options, risks and possible complications. To fulfill our ethical and legal obligations, as recommended by the American Medical Association's Code of Ethics, I have informed the patient of my clinical impression; the nature and purpose of the treatment or procedure; the risks, benefits, and possible complications of the intervention; the alternatives, including doing nothing; the risk(s) and benefit(s) of the alternative treatment(s) or procedure(s); and the risk(s) and benefit(s) of doing nothing. The patient was provided information about the general risks and possible complications associated with the procedure. These may include, but are not limited to: failure to achieve desired goals, infection, bleeding, organ or nerve damage, allergic  reactions, paralysis, and death. In addition, the patient was informed of those risks and complications associated to Spine-related procedures, such as failure to decrease pain;  infection (i.e.: Meningitis, epidural or intraspinal abscess); bleeding (i.e.: epidural hematoma, subarachnoid hemorrhage, or any other type of intraspinal or peri-dural bleeding); organ or nerve damage (i.e.: Any type of peripheral nerve, nerve root, or spinal cord injury) with subsequent damage to sensory, motor, and/or autonomic systems, resulting in permanent pain, numbness, and/or weakness of one or several areas of the body; allergic reactions; (i.e.: anaphylactic reaction); and/or death. Furthermore, the patient was informed of those risks and complications associated with the medications. These include, but are not limited to: allergic reactions (i.e.: anaphylactic or anaphylactoid reaction(s)); adrenal axis suppression; blood sugar elevation that in diabetics may result in ketoacidosis or comma; water retention that in patients with history of congestive heart failure may result in shortness of breath, pulmonary edema, and decompensation with resultant heart failure; weight gain; swelling or edema; medication-induced neural toxicity; particulate matter embolism and blood vessel occlusion with resultant organ, and/or nervous system infarction; and/or aseptic necrosis of one or more joints. Finally, the patient was informed that Medicine is not an exact science; therefore, there is also the possibility of unforeseen or unpredictable risks and/or possible complications that may result in a catastrophic outcome. The patient indicated having understood very clearly. We have given the patient no guarantees and we have made no promises. Enough time was given to the patient to ask questions, all of which were answered to the patient's satisfaction. Mr. Pellegrin has indicated that he wanted to continue with the procedure. Attestation: I,  the ordering provider, attest that I have discussed with the patient the benefits, risks, side-effects, alternatives, likelihood of achieving goals, and potential problems during recovery for the procedure that I have provided informed consent.  Date  Time: 05/01/2022 12:30 PM  Description of procedure  Start Time: 1252 hrs  Local Anesthesia: Once the patient was positioned, prepped, and time-out was completed. The target area was identified located. The skin was marked with an approved surgical skin marker. Once marked, the skin (epidermis, dermis, and hypodermis), and deeper tissues (fat, connective tissue and muscle) were infiltrated with a small amount of a short-acting local anesthetic, loaded on a 10cc syringe with a 25G, 1.5-in  Needle. An appropriate amount of time was allowed for local anesthetics to take effect before proceeding to the next step. Local Anesthetic: Lidocaine 1-2% The unused portion of the local anesthetic was discarded in the proper designated containers. Safety Precautions: Aspiration looking for blood return was conducted prior to all injections. At no point did I inject any substances, as a needle was being advanced. Before injecting, the patient was told to immediately notify me if he was experiencing any new onset of "ringing in the ears, or metallic taste in the mouth". No attempts were made at seeking any paresthesias. Safe injection practices and needle disposal techniques used. Medications properly checked for expiration dates. SDV (single dose vial) medications used. After the completion of the procedure, all disposable equipment used was discarded in the proper designated medical waste containers.  Technical description: Protocol guidelines were followed. After positioning, the target area was identified and prepped in the usual manner. Skin & deeper tissues infiltrated with local anesthetic. Appropriate amount of time allowed to pass for local anesthetics to take  effect. Proper needle placement secured. Once satisfactory needle placement was confirmed, I proceeded to inject the desired solution in slow, incremental fashion, intermittently assessing for discomfort or any signs of abnormal or undesired spread of substance. Once completed, the needle was removed and disposed of, as per hospital protocols.  The area was cleaned, making sure to leave some of the prepping solution back to take advantage of its long term bactericidal properties.  Aspiration:  Negative        Vitals:   05/01/22 1238  BP: 130/88  Pulse: 85  Resp: 16  Temp: (!) 97.1 F (36.2 C)  SpO2: 99%  Weight: (!) 333 lb (151 kg)  Height: 6' (1.829 m)    End Time: 1256 hrs  Imaging guidance  Imaging-assisted Technique: None required. Indication(s): N/A Exposure Time: N/A Contrast: None Fluoroscopic Guidance: N/A Ultrasound Guidance: N/A Interpretation: N/A  Post-op assessment  Post-procedure Vital Signs:  Pulse/HCG Rate: 85  Temp: (!) 97.1 F (36.2 C) Resp: 16 BP: 130/88 SpO2: 99 %  EBL: None  Complications: No immediate post-treatment complications observed by team, or reported by patient.  Note: The patient tolerated the entire procedure well. A repeat set of vitals were taken after the procedure and the patient was kept under observation following institutional policy, for this type of procedure. Post-procedural neurological assessment was performed, showing return to baseline, prior to discharge. The patient was provided with post-procedure discharge instructions, including a section on how to identify potential problems. Should any problems arise concerning this procedure, the patient was given instructions to immediately contact us, at any time, without hesitation. In any case, we plan to contact the patient by telephone for a follow-up status report regarding this interventional procedure.  Comments:  No additional relevant information.  Plan of care  Chronic  Opioid Analgesic:   hydrocodone 7.5 mg 3 times daily as needed     Medications administered: We administered lidocaine, diazepam, ropivacaine (PF) 2 mg/mL (0.2%), and sodium hyaluronate (viscosup).  Follow-up plan:   No follow-ups on file.     Recent Visits Date Type Provider Dept  03/30/22 Office Visit Edward Jolly, MD [facility]  03/08/22 Procedure visit Edward Jolly, MD [facility]  02/07/22 Office Visit Edward Jolly, MD [facility]  Showing recent visits within past 90 days and meeting all other requirements Today's Visits Date Type Provider Dept  05/01/22 Procedure visit Edward Jolly, MD [facility]  Showing today's visits and meeting all other requirements Future Appointments No visits were found meeting these conditions. Showing future appointments within next 90 days and meeting all other requirements   Disposition: Discharge home  Discharge (Date  Time): 05/01/2022;   hrs.   Primary Care Physician: Smitty Cords, DO Location: [facility] Note by: Edward Jolly, MD Date: 05/01/2022; Time: 1:13 PM  DISCLAIMER: Medicine is not an Visual merchandiser. It has no guarantees or warranties. The decision to proceed with this intervention was based on the information collected from the patient. Conclusions were drawn from the patient's questionnaire, interview, and examination. Because information was provided in large part by the patient, it cannot be guaranteed that it has not been purposely or unconsciously manipulated or altered. Every effort has been made to obtain as much accurate, relevant, available data as possible. Always take into account that the treatment will also be dependent on availability of resources and existing treatment guidelines, considered by other Pain Management Specialists as being common knowledge and practice, at the time of the intervention. It is also important to point out that  variation in procedural techniques and pharmacological choices are the acceptable norm. For Medico-Legal review purposes, the indications, contraindications, technique, and results of the these procedures should only be evaluated, judged and interpreted by a Board-Certified Interventional Pain  Specialist with extensive familiarity and expertise in the same exact procedure and technique.

## 2022-05-01 NOTE — Progress Notes (Signed)
Safety precautions to be maintained throughout the outpatient stay will include: orient to surroundings, keep bed in low position, maintain call bell within reach at all times, provide assistance with transfer out of bed and ambulation.  
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
## 2022-05-01 NOTE — Progress Notes (Signed)
PROVIDER NOTE: Interpretation of information contained herein should be left to medically-trained personnel. Specific patient instructions are provided elsewhere under "Patient Instructions" section of medical record. This document was created in part using STT-dictation technology, any transcriptional errors that may result from this process are unintentional.  Patient: [name] Type: Established DOB: 12-Jun-1955 MRN: [id number] PCP: Smitty Cords, DO  Service: Procedure DOS: 05/01/2022 Setting: Ambulatory Location: Ambulatory outpatient facility Delivery: Face-to-face Provider: Edward Jolly, MD Specialty: Interventional Pain Management Specialty designation: 09 Location: Outpatient facility Ref. Prov.: Edward Jolly, MD       Interventional Therapy   Procedure: Lumbar Facet, Medial Branch Radiofrequency Ablation (RFA)  Laterality:  Bilateral   Level: L3, L4, and L5 Medial Branch Level(s). These levels will denervate the L3-4 and L4-5 lumbar facet joints.  Imaging: Fluoroscopy-guided         Anesthesia: Local anesthesia (1-2% Lidocaine) Anxiolysis:10 mg PO Valium DOS: 05/01/2022  Performed by: Edward Jolly, MD  Purpose: Therapeutic/Palliative Indications: Low back pain severe enough to impact quality of life or function. Indications: 1. Lumbar facet arthropathy   2. Chronic pain of both knees   3. Bilateral primary osteoarthritis of knee   4. Chronic pain syndrome    [name] has been dealing with the above chronic pain for longer than three months and has either failed to respond, was unable to tolerate, or simply did not get enough benefit from other more conservative therapies including, but not limited to: 1. Over-the-counter medications 2. Anti-inflammatory medications 3. Muscle relaxants 4. Membrane stabilizers 5. Opioids 6. Physical therapy and/or chiropractic manipulation 7. Modalities (Heat, ice, etc.) 8. Invasive techniques such as nerve blocks. Mr.  Arrona has attained more than 50% relief of the pain from a series of diagnostic injections conducted in separate occasions.  Pain Score: Pre-procedure: 6  (knee 5/10)/10 Post-procedure: 0-No pain/10     Position / Prep / Materials:  Position: Prone  Prep solution: DuraPrep (Iodine Povacrylex [0.7% available iodine] and Isopropyl Alcohol, 74% w/w) Prep Area: Entire Lumbosacral Region (Lower back from mid-thoracic region to end of tailbone and from flank to flank.) Materials:  Tray: RFA (Radiofrequency) tray Needle(s):  Type: RFA (Teflon-coated radiofrequency ablation needles) Gauge (G): 22  Length: Regular (10cm) Qty: 3      Pre-op H&P Assessment:  [name] is a 67 y.o. (year old), male patient, seen today for interventional treatment. He  has a past surgical history that includes Knee surgery (Right); Gastric bypass (01/17/2000); prostate seeding; Carpal tunnel release (Right, 12/23/2014); Carpal tunnel release (Left, 01/06/2015); Colonoscopy with propofol (N/A, 10/08/2017); Cardioversion (N/A, 05/15/2019); Cardioversion (N/A, 03/13/2014); Reverse shoulder arthroplasty (Right, 09/27/2021); Bicept tenodesis (Right, 09/27/2021); Shoulder arthroscopy with rotator cuff repair and open biceps tenodesis (Left, 12/27/2021); and Cardioversion (N/A, 03/24/2022). [name] has a current medication list which includes the following prescription(s): amiodarone, aspirin ec, benazepril, buspirone, opurity calcium citrate plus, carboxymethylcellulose, chia seed, cyanocobalamin, cyclobenzaprine, duloxetine, ferrous sulfate, [START ON 05/21/2022] hydrocodone-acetaminophen, [START ON 06/20/2022] hydrocodone-acetaminophen, [START ON 07/20/2022] hydrocodone-acetaminophen, melatonin, metoprolol succinate, multivitamin with minerals, xarelto, tamsulosin, trazodone, and vitamin d (ergocalciferol). His primarily concern today is the No chief complaint on file.  Initial Vital Signs:  Pulse/HCG Rate: 85ECG Heart Rate:  71 Temp: (!) 97.1 F (36.2 C) Resp: 16 BP: 130/88 SpO2: 99 %  BMI: Estimated body mass index is 45.16 kg/m as calculated from the following:   Height as of this encounter: 6' (1.829 m).   Weight as of this encounter: 333 lb (151 kg).  Risk  Assessment: Allergies: Reviewed. He is allergic to bupivacaine liposome.  Allergy Precautions: None required Coagulopathies: Reviewed. None identified.  Blood-thinner therapy: None at this time Active Infection(s): Reviewed. None identified. [name] is afebrile  Site Confirmation: [name] was asked to confirm the procedure and laterality before marking the site Procedure checklist: Completed Consent: Before the procedure and under the influence of no sedative(s), amnesic(s), or anxiolytics, the patient was informed of the treatment options, risks and possible complications. To fulfill our ethical and legal obligations, as recommended by the American Medical Association's Code of Ethics, I have informed the patient of my clinical impression; the nature and purpose of the treatment or procedure; the risks, benefits, and possible complications of the intervention; the alternatives, including doing nothing; the risk(s) and benefit(s) of the alternative treatment(s) or procedure(s); and the risk(s) and benefit(s) of doing nothing. The patient was provided information about the general risks and possible complications associated with the procedure. These may include, but are not limited to: failure to achieve desired goals, infection, bleeding, organ or nerve damage, allergic reactions, paralysis, and death. In addition, the patient was informed of those risks and complications associated to Spine-related procedures, such as failure to decrease pain; infection (i.e.: Meningitis, epidural or intraspinal abscess); bleeding (i.e.: epidural hematoma, subarachnoid hemorrhage, or any other type of intraspinal or peri-dural bleeding); organ or nerve damage (i.e.:  Any type of peripheral nerve, nerve root, or spinal cord injury) with subsequent damage to sensory, motor, and/or autonomic systems, resulting in permanent pain, numbness, and/or weakness of one or several areas of the body; allergic reactions; (i.e.: anaphylactic reaction); and/or death. Furthermore, the patient was informed of those risks and complications associated with the medications. These include, but are not limited to: allergic reactions (i.e.: anaphylactic or anaphylactoid reaction(s)); adrenal axis suppression; blood sugar elevation that in diabetics may result in ketoacidosis or comma; water retention that in patients with history of congestive heart failure may result in shortness of breath, pulmonary edema, and decompensation with resultant heart failure; weight gain; swelling or edema; medication-induced neural toxicity; particulate matter embolism and blood vessel occlusion with resultant organ, and/or nervous system infarction; and/or aseptic necrosis of one or more joints. Finally, the patient was informed that Medicine is not an exact science; therefore, there is also the possibility of unforeseen or unpredictable risks and/or possible complications that may result in a catastrophic outcome. The patient indicated having understood very clearly. We have given the patient no guarantees and we have made no promises. Enough time was given to the patient to ask questions, all of which were answered to the patient's satisfaction. Mr. Hamel has indicated that he wanted to continue with the procedure. Attestation: I, the ordering provider, attest that I have discussed with the patient the benefits, risks, side-effects, alternatives, likelihood of achieving goals, and potential problems during recovery for the procedure that I have provided informed consent. Date  Time: 05/01/2022 12:30 PM   Pre-Procedure Preparation:  Monitoring: As per clinic protocol. Respiration, ETCO2, SpO2, BP, heart rate  and rhythm monitor placed and checked for adequate function Safety Precautions: Patient was assessed for positional comfort and pressure points before starting the procedure. Time-out: I initiated and conducted the "Time-out" before starting the procedure, as per protocol. The patient was asked to participate by confirming the accuracy of the "Time Out" information. Verification of the correct person, site, and procedure were performed and confirmed by me, the nursing staff, and the patient. "Time-out" conducted as per Joint Commission's Universal Protocol (  [id number].01.01.01). Time: 1321 Start Time: 1321 hrs.  Description of Procedure:          Laterality: bilateral Levels:  See above. Safety Precautions: Aspiration looking for blood return was conducted prior to all injections. At no point did we inject any substances, as a needle was being advanced. Before injecting, the patient was told to immediately notify me if he was experiencing any new onset of "ringing in the ears, or metallic taste in the mouth". No attempts were made at seeking any paresthesias. Safe injection practices and needle disposal techniques used. Medications properly checked for expiration dates. SDV (single dose vial) medications used. After the completion of the procedure, all disposable equipment used was discarded in the proper designated medical waste containers. Local Anesthesia: Protocol guidelines were followed. The patient was positioned over the fluoroscopy table. The area was prepped in the usual manner. The time-out was completed. The target area was identified using fluoroscopy. A 12-in long, straight, sterile hemostat was used with fluoroscopic guidance to locate the targets for each level blocked. Once located, the skin was marked with an approved surgical skin marker. Once all sites were marked, the skin (epidermis, dermis, and hypodermis), as well as deeper tissues (fat, connective tissue and muscle) were infiltrated with a  small amount of a short-acting local anesthetic, loaded on a 10cc syringe with a 25G, 1.5-in  Needle. An appropriate amount of time was allowed for local anesthetics to take effect before proceeding to the next step. Technical description of process:  Radiofrequency Ablation (RFA) L3 Medial Branch Nerve RFA: The target area for the L3 medial branch is at the junction of the postero-lateral aspect of the superior articular process and the superior, posterior, and medial edge of the transverse process of L4. Under fluoroscopic guidance, a Radiofrequency needle was inserted until contact was made with os over the superior postero-lateral aspect of the pedicular shadow (target area). Sensory and motor testing was conducted to properly adjust the position of the needle. Once satisfactory placement of the needle was achieved, the numbing solution was slowly injected after negative aspiration for blood. 2.0 mL of the nerve block solution was injected without difficulty or complication. After waiting for at least 3 minutes, the ablation was performed. Once completed, the needle was removed intact. L4 Medial Branch Nerve RFA: The target area for the L4 medial branch is at the junction of the postero-lateral aspect of the superior articular process and the superior, posterior, and medial edge of the transverse process of L5. Under fluoroscopic guidance, a Radiofrequency needle was inserted until contact was made with os over the superior postero-lateral aspect of the pedicular shadow (target area). Sensory and motor testing was conducted to properly adjust the position of the needle. Once satisfactory placement of the needle was achieved, the numbing solution was slowly injected after negative aspiration for blood. 2.0 mL of the nerve block solution was injected without difficulty or complication. After waiting for at least 3 minutes, the ablation was performed. Once completed, the needle was removed intact. L5 Medial  Branch Nerve RFA: The target area for the L5 medial branch is at the junction of the postero-lateral aspect of the superior articular process of S1 and the superior, posterior, and medial edge of the sacral ala. Under fluoroscopic guidance, a Radiofrequency needle was inserted until contact was made with os over the superior postero-lateral aspect of the pedicular shadow (target area). Sensory and motor testing was conducted to properly adjust the position of the needle. Once satisfactory  placement of the needle was achieved, the numbing solution was slowly injected after negative aspiration for blood. 2.0 mL of the nerve block solution was injected without difficulty or complication. After waiting for at least 3 minutes, the ablation was performed. Once completed, the needle was removed intact.   6 cc solution made of 4 cc of 0.2% ropivacaine, 1 cc of Decadron 10 mg/cc.  1 cc injected at each level above on the right & left after sensorimotor testing, prior to lesioning.  Radiofrequency lesioning (ablation):  Radiofrequency Generator: Medtronic AccurianTM AG 1000 RF Generator Sensory Stimulation Parameters: 50 Hz was used to locate & identify the nerve, making sure that the needle was positioned such that there was no sensory stimulation below 0.3 V or above 0.7 V. Motor Stimulation Parameters: 2 Hz was used to evaluate the motor component. Care was taken not to lesion any nerves that demonstrated motor stimulation of the lower extremities at an output of less than 2.5 times that of the sensory threshold, or a maximum of 2.0 V. Lesioning Technique Parameters: Standard Radiofrequency settings. (Not bipolar or pulsed.) Temperature Settings: 80 degrees C Lesioning time: 60 seconds Intra-operative Compliance: Compliant  Once the entire procedure was completed, the treated area was cleaned, making sure to leave some of the prepping solution back to take advantage of its long term bactericidal properties.     Illustration of the posterior view of the lumbar spine and the posterior neural structures. Laminae of L2 through S1 are labeled. DPRL5, dorsal primary ramus of L5; DPRS1, dorsal primary ramus of S1; DPR3, dorsal primary ramus of L3; FJ, facet (zygapophyseal) joint L3-L4; I, inferior articular process of L4; LB1, lateral branch of dorsal primary ramus of L1; IAB, inferior articular branches from L3 medial branch (supplies L4-L5 facet joint); IBP, intermediate branch plexus; MB3, medial branch of dorsal primary ramus of L3; NR3, third lumbar nerve root; S, superior articular process of L5; SAB, superior articular branches from L4 (supplies L4-5 facet joint also); TP3, transverse process of L3.  Vitals:   05/01/22 1335 05/01/22 1340 05/01/22 1345 05/01/22 1351  BP: (!) 119/93 (!) 122/98 120/76 (!) 123/100  Pulse:      Resp: Temp:      SpO2: 97% 94% 94% 97%  Weight:      Height:        Start Time: 1321 hrs. End Time: 1350 hrs.  Imaging Guidance (Spinal):          Type of Imaging Technique: Fluoroscopy Guidance (Spinal) Indication(s): Assistance in needle guidance and placement for procedures requiring needle placement in or near specific anatomical locations not easily accessible without such assistance. Exposure Time: Please see nurses notes. Contrast: None used. Fluoroscopic Guidance: I was personally present during the use of fluoroscopy. "Tunnel Vision Technique" used to obtain the best possible view of the target area. Parallax error corrected before commencing the procedure. "Direction-depth-direction" technique used to introduce the needle under continuous pulsed fluoroscopy. Once target was reached, antero-posterior, oblique, and lateral fluoroscopic projection used confirm needle placement in all planes. Images permanently stored in EMR. Interpretation: No contrast injected. I personally interpreted the imaging intraoperatively. Adequate needle placement confirmed in  multiple planes. Permanent images saved into the patient's record.  Antibiotic Prophylaxis:   Anti-infectives (From admission, onward)    None      Indication(s): None identified  Post-operative Assessment:  Post-procedure Vital Signs:  Pulse/HCG Rate: 8577 Temp: (!) 97.1 F (36.2 C) Resp: 16 BP: Marland Kitchen)  123/100 SpO2: 97 %  EBL: None  Complications: No immediate post-treatment complications observed by team, or reported by patient.  Note: The patient tolerated the entire procedure well. A repeat set of vitals were taken after the procedure and the patient was kept under observation following institutional policy, for this type of procedure. Post-procedural neurological assessment was performed, showing return to baseline, prior to discharge. The patient was provided with post-procedure discharge instructions, including a section on how to identify potential problems. Should any problems arise concerning this procedure, the patient was given instructions to immediately contact us, at any time, without hesitation. In any case, we plan to contact the patient by telephone for a follow-up status report regarding this interventional procedure.  Comments:  No additional relevant information.  Plan of Care (POC)  Orders:  Orders Placed This Encounter  Procedures   DG PAIN CLINIC C-ARM 1-60 MIN NO REPORT    Intraoperative interpretation by procedural physician at [facility].    Standing Status:   Standing    Number of Occurrences:   1    Order Specific Question:   Reason for exam:    Answer:   Assistance in needle guidance and placement for procedures requiring needle placement in or near specific anatomical locations not easily accessible without such assistance.   Chronic Opioid Analgesic:   hydrocodone 7.5 mg 3 times daily as needed     Medications ordered for procedure: Meds ordered this encounter  Medications   lidocaine (XYLOCAINE) 2 % (with pres) injection 400 mg    diazepam (VALIUM) tablet 10 mg    Make sure Flumazenil is available in the pyxis when using this medication. If oversedation occurs, administer 0.2 mg IV over 15 sec. If after 45 sec no response, administer 0.2 mg again over 1 min; may repeat at 1 min intervals; not to exceed 4 doses (1 mg)   dexamethasone (DECADRON) injection 10 mg   ropivacaine (PF) 2 mg/mL (0.2%) (NAROPIN) injection 9 mL   sodium hyaluronate (viscosup) (GELSYN-3) intra-articular injection 16.8 mg    Do not substitute. Deliver to facility day before procedure.   dexamethasone (DECADRON) injection 10 mg   sodium hyaluronate (viscosup) (GELSYN-3) intra-articular injection 16.8 mg    Do not substitute. Deliver to facility day before procedure.   Medications administered: We administered lidocaine, diazepam, dexamethasone, ropivacaine (PF) 2 mg/mL (0.2%), sodium hyaluronate (viscosup), dexamethasone, and sodium hyaluronate (viscosup).  See the medical record for exact dosing, route, and time of administration.  Follow-up plan:   Return in about 8 weeks (around 06/26/2022) for Post Procedure Evaluation, in person.      Recent Visits Date Type Provider Dept  03/30/22 Office Visit Edward Jolly, MD [facility]  03/08/22 Procedure visit Edward Jolly, MD [facility]  02/07/22 Office Visit Edward Jolly, MD [facility]  Showing recent visits within past 90 days and meeting all other requirements Today's Visits Date Type Provider Dept  05/01/22 Procedure visit Edward Jolly, MD [facility]  Showing today's visits and meeting all other requirements Future Appointments Date Type Provider Dept  06/28/22 Appointment Edward Jolly, MD [facility]  Showing future appointments within next 90 days and meeting all other requirements  Disposition: Discharge home  Discharge (Date  Time): 05/01/2022; 1405 hrs.   Primary Care Physician: Smitty Cords, DO Location: [facility]  Outpatient Pain Management Facility Note by: Edward Jolly, MD (TTS technology used. I apologize for any typographical errors that were not detected and corrected.)  Date: 05/01/2022; Time: 3:06 PM  Disclaimer:  Medicine is not an Visual merchandiser. The only guarantee in medicine is that nothing is guaranteed. It is important to note that the decision to proceed with this intervention was based on the information collected from the patient. The Data and conclusions were drawn from the patient's questionnaire, the interview, and the physical examination. Because the information was provided in large part by the patient, it cannot be guaranteed that it has not been purposely or unconsciously manipulated. Every effort has been made to obtain as much relevant data as possible for this evaluation. It is important to note that the conclusions that lead to this procedure are derived in large part from the available data. Always take into account that the treatment will also be dependent on availability of resources and existing treatment guidelines, considered by other Pain Management Practitioners as being common knowledge and practice, at the time of the intervention. For Medico-Legal purposes, it is also important to point out that variation in procedural techniques and pharmacological choices are the acceptable norm. The indications, contraindications, technique, and results of the above procedure should only be interpreted and judged by a Board-Certified Interventional Pain Specialist with extensive familiarity and expertise in the same exact procedure and technique.

## 2022-05-02 ENCOUNTER — Telehealth: Payer: Self-pay

## 2022-05-02 ENCOUNTER — Ambulatory Visit: Payer: Medicare Other | Attending: Cardiovascular Disease | Admitting: Cardiovascular Disease

## 2022-05-02 ENCOUNTER — Encounter: Payer: Self-pay | Admitting: Cardiovascular Disease

## 2022-05-02 VITALS — BP 135/75 | HR 72 | Ht 72.0 in | Wt 338.4 lb

## 2022-05-02 DIAGNOSIS — I4819 Other persistent atrial fibrillation: Secondary | ICD-10-CM | POA: Diagnosis not present

## 2022-05-02 DIAGNOSIS — T466X5D Adverse effect of antihyperlipidemic and antiarteriosclerotic drugs, subsequent encounter: Secondary | ICD-10-CM | POA: Diagnosis not present

## 2022-05-02 DIAGNOSIS — I519 Heart disease, unspecified: Secondary | ICD-10-CM

## 2022-05-02 DIAGNOSIS — I2510 Atherosclerotic heart disease of native coronary artery without angina pectoris: Secondary | ICD-10-CM | POA: Diagnosis not present

## 2022-05-02 DIAGNOSIS — I10 Essential (primary) hypertension: Secondary | ICD-10-CM

## 2022-05-02 DIAGNOSIS — M791 Myalgia, unspecified site: Secondary | ICD-10-CM | POA: Diagnosis not present

## 2022-05-02 DIAGNOSIS — T466X5A Adverse effect of antihyperlipidemic and antiarteriosclerotic drugs, initial encounter: Secondary | ICD-10-CM

## 2022-05-02 DIAGNOSIS — I2584 Coronary atherosclerosis due to calcified coronary lesion: Secondary | ICD-10-CM

## 2022-05-02 NOTE — Patient Instructions (Addendum)
Referral to EP for persistent atrial fibrillation  Medication Instructions:  Stop aspirin Stop the amiodarone  If you need a refill on your cardiac medications before your next appointment, please call your pharmacy.   Lab work: No new labs needed  Testing/Procedures: No new testing needed  Follow-Up: At [facility], you and your health needs are our priority.  As part of our continuing mission to provide you with exceptional heart care, we have created designated Provider Care Teams.  These Care Teams include your primary Cardiologist (physician) and Advanced Practice Providers (APPs -  Physician Assistants and Nurse Practitioners) who all work together to provide you with the care you need, when you need it.  You will need a follow up appointment in 6 months  Providers on your designated Care Team:   Nicolasa Ducking, NP Eula Listen, PA-C Cadence Fransico Michael, [place]  COVID-19 Vaccine Information can be found at: [email or web address] For questions related to vaccine distribution or appointments, please email [email or web address] or call [phone].

## 2022-05-02 NOTE — Telephone Encounter (Signed)
Called PP. Denies any needs at this time. Instructed to call if needed. 

## 2022-05-03 ENCOUNTER — Ambulatory Visit: Payer: Medicare Other

## 2022-05-03 DIAGNOSIS — M6281 Muscle weakness (generalized): Secondary | ICD-10-CM | POA: Diagnosis not present

## 2022-05-03 DIAGNOSIS — G8929 Other chronic pain: Secondary | ICD-10-CM

## 2022-05-03 DIAGNOSIS — M25511 Pain in right shoulder: Secondary | ICD-10-CM | POA: Diagnosis not present

## 2022-05-03 DIAGNOSIS — M25512 Pain in left shoulder: Secondary | ICD-10-CM | POA: Diagnosis not present

## 2022-05-03 NOTE — Therapy (Signed)
OUTPATIENT PHYSICAL THERAPY SHOULDER TREATMENT  Patient Name: [name] MRN: [id number] DOB:09-08-1955, 66 y.o., male Today's Date: 05/03/2022   PT End of Session - 05/03/22 1313     Visit Number 24    Number of Visits 49    Date for PT Re-Evaluation 06/20/22    Authorization Type [employer] Medicare    Authorization Time Period UHC medicare 2024  VL:based on [place]    PT Start Time 1315    PT Stop Time 1400    PT Time Calculation (min) 45 min    Activity Tolerance Patient tolerated treatment well    Behavior During Therapy WFL for tasks assessed/performed            Past Medical History:  Diagnosis Date   Anxiety    Aortic atherosclerosis    Cardiomyopathy (in setting of Afib)    a.) TTE 12/26/2013: EF 45-50%, mild ant and antsept HK. mild MR. Mod dil LA. nl RV fxn. Rhythm was Afib; b.) TTE 07/04/2019: EF 55%, mid-apical anteroseptal HK, mild MAC, mild Ao sclerosis, G2DD, RVSP 45.3   Chronic pain syndrome    a.) followed by pain management   Chronic, continuous use of opioids    a.) hydrocodone/APAP 7.5/325 mg; followed by pain management   Coronary artery disease 11/06/2014   a.) cCTA 11/06/2014: Ca score 224 (all in pLAD) -- 74th percentile for age/sex matched control   Diverticulosis    DJD (degenerative joint disease) of knee    History of hiatal hernia    History of kidney stones 2012   Hyperlipidemia    Hyperplastic colon polyp    Hypertension    Internal hemorrhoids    Intervertebral disc disorder with radiculopathy of lumbosacral region    Long term current use FULL DOSE (325 mg) aspirin    Long term current use of amiodarone    Long term current use of antithrombotics/antiplatelets    a.) rivaroxaban   Lower extremity edema    Mixed hyperlipidemia    Myalgia due to statin    Osteoarthritis    PAF (paroxysmal atrial fibrillation)    a.) CHA2DS2VASc = 4 (age, HTN, CHF, vascular disease history);  b.) s/p DCCV 03/13/2014 (200 J x1); c.) s/p DCCV 05/15/2019 (150 J  x 1, 200 J x2); d.) rate/rhythm maintained on oral amiodarone + metoprolol succinate; chronically anticoagulated with rivaroxaban   Pernicious anemia    Prostate cancer    Pulmonary nodule, right    a. 10/2014 CardiAggie CoManson [place](8Marland KitchenCherry6432ar RLL nAggie CoManson Pas[place](97Marland KitchenBuchanan8717are CoManson PasBaKentuckAggie CoManson PasBaKentucky(8Marland KitchenOnalaska1330arke1919mThad R8Aggie CMa<MEASAggie CoManson PasBaKentucky5Marland KitchenBawcomville2164ar6mThad R8Aggie CMa<MEASUREMEN(S43mMyrCar1.47BaKentAggie CoMansonAggie CoManson PasBaKentuckMarlanAggie CoManson PasBaKentuckyAggie CoManson PasBa[place](4Marland KitchenOtisville8446artchenEl Paso5004arrt Ritchie90SAggie CoManson PasBaKentuckMarland KitchenSmithvilAggie CoManson PasBa[place](7Marland KitchenCedar [place]rest2365ar CoManAggiAggie CoManson PasBaK[place]ucky(8Aggie CoMansonAggie CoManson PasBa[place](60Marland KitchenFulton9116arcky(2Marland KitchenLock SprinStryke1966mThad R8Aggie CMa<MEASUREMEN([phone] St2mMy1. ioHarm>54on Pierr, environmentaleep difficulties    a.) takes melatonin + trazodone PRN   SVT (supraventricular tachycardia)    Tubular adenoma of colon    Past Surgical History:  Procedure Laterality Date   BICEPT TENODESIS Right 09/27/2021   Procedure: Right reverse shoulder arthroplasty, biceps tenodesis;  Surgeon: Patel, Sunny, MD;  Location: [facility];  Service: Orthopedics;  Laterality: Right;   CARDIOVERSION N/A 05/15/2019   Procedure: CARDIOVERSION;  Surgeon: Gollan, Timothy J, MD;  Location: [facility];  Service: Cardiovascular;  Laterality: N/A;   CARDIOVERSION N/A 03/13/2014   Procedure: CARDIOVERSION; Location: [facility]; Surgeon: Timothy Gollan, MD   CARDIOVERSION N/A 03/24/2022   Procedure: CARDIOVERSION;  Surgeon: Gollan, Timothy J, MD;  Location: [facility];  Service: Cardiovascular;  Laterality: N/A;   CARPAL TUNNEL RELEASE Right 12/23/2014   Procedure: CARPAL TUNNEL RELEASE;  Surgeon: Howard Miller, MD;  Location: [facility];  Service: Orthopedics;  Laterality: Right;   CARPAL TUNNEL RELEASE Left 01/06/2015   Procedure: CARPAL TUNNEL RELEASE;  Surgeon: Deeann Saint, MD;  Location: [facility];  Service: Orthopedics;  Laterality: Left;   COLONOSCOPY WITH PROPOFOL N/A 10/08/2017   Procedure: COLONOSCOPY WITH PROPOFOL;  Surgeon: Toney Reil, MD;  Location[facility];  Service: Gastroenterology;  Laterality: N/A;   GASTRIC BYPASS  01/17/2000   KNEE SURGERY Right    knee trauma x3   prostate seeding     REVERSE SHOULDER ARTHROPLASTY Right 09/27/2021   Procedure: Right reverse shoulder arthroplasty, biceps tenodesis;  Surgeon: Signa Kell, MD;  Location:  [facility];  Service: Orthopedics;  Laterality: Right;   SHOULDER ARTHROSCOPY WITH ROTATOR CUFF REPAIR AND OPEN BICEPS TENODESIS Left 12/27/2021   Procedure: Left shoulder arthroscopic cuff repair (supraspinatus and subscapularis) with Regeneten Patch application;  Surgeon: Signa Kell, MD;  Location: [facility];  Service: Orthopedics;  Laterality: Left;   Patient Active Problem List   Diagnosis Date Noted   Symptomatic anemia 04/26/2022   Chronic pain of both knees 03/30/2022   Bilateral primary osteoarthritis of knee 03/30/2022   Shortness of breath 03/24/2022   S/p reverse total shoulder arthroplasty 09/27/2021   Lesion of bone of lumbosacral spine (L5) 05/12/2021   Localized osteoarthritis of shoulder regions, bilateral 03/29/2021   Chronic pain of both shoulders 03/29/2021   Drug-induced myopathy 02/21/2021   Trigger finger of right hand 11/15/2020   Prostate cancer 07/26/2020   Insomnia 08/01/2019   Primary osteoarthritis of both wrists 02/17/2019   Trigger middle finger of left hand 02/17/2019   Chronic pain syndrome 02/17/2019   Chronic radicular lumbar pain 10/08/2018   Lumbar radiculopathy 10/08/2018   Lumbar degenerative disc disease 10/08/2018   Lumbar facet arthropathy 10/08/2018   Lumbar facet joint syndrome 10/08/2018   Intervertebral disc disorder with radiculopathy of lumbosacral region    Osteoarthritis of knee 11/30/2017   Osteoarthritis of wrist 11/30/2017   Pernicious anemia 05/22/2016   Persistent atrial fibrillation 12/20/2015   Carpal tunnel syndrome 11/16/2014   DJD (degenerative joint disease) of knee 10/27/2014   Bilateral carpal tunnel syndrome 10/27/2014   Morbid obesity with BMI of 50.0-59.9, adult 10/27/2014   Mixed hyperlipidemia 10/27/2014   H/O gastric bypass 03/20/2014   Hyperkalemia 03/20/2014   History of prostate cancer 12/26/2013   Essential hypertension 12/26/2013   Encounter for anticoagulation discussion and counseling 12/26/2013   PCP:  Smitty Cords, DO  REFERRING PROVIDER: Signa Kell, MD  REFERRING DIAGNOSIS: Incomplete tear of left rotator cuff, unspecified whether traumatic M75.112   THERAPY DIAG: Chronic left shoulder pain  Chronic right shoulder pain  Muscle weakness (generalized)  RATIONALE FOR EVALUATION AND TREATMENT: Rehabilitation  ONSET DATE: Surgery 12/27/21, Chronic L shoulder pain for multiple years  FOLLOW UP APPT WITH PROVIDER: Yes    FROM INITIAL EVALUATION SUBJECTIVE:  Chief Complaint: S/p L shoulder RTC repair, biceps tenodesis, and shoulder debridement  Pertinent History Pt with history of chronic bilateral shoulder pain. He underwent right reverse TSR and right biceps tenodesis on 09/27/21. He experienced post-operative instability in R shoulder however it improved and he was able to avoid a revision. On 12/27/21 he underwent left arthroscopic rotator cuff repair (subscapularis (full-thickness), supraspinatus (partial-thickness) side-to-side repair + Regeneten patch), left arthroscopic biceps tenodesis, and left extensive debridement of shoulder (glenohumeral and subacromial spaces). Per surgical note other intraoperative findings include Type 2 SLAP tear, degenerative anterior labral tearing, grade 2 degenerative changes to articular cartilage of humeral head, and significant posterior capsule synovitis. No significant post-operative complications. He arrives without abduction pillow and states that Dr. Allena Katz advised him he doesn't have to wear the abduction pillow. He got behind on his pain medication today and states that today his shoulder has been the most painful he has experienced since surgery. He also noticed swelling in L lateral upper arm this morning. He did take his pain medication at 14:00, before  PT evaluation.  He has a history of chronic back pain and underwent a lumbar facet, medial branch radiofrequency ablation on 01/02/22. At the time of the evaluation pt has not yet appreciated improvement in his back pain from this procedure. He has a past medical history of Cardiomyopathy (in setting of Afib), DJD (degenerative joint disease) of knee, History of kidney stones, Intervertebral disc disorder with radiculopathy of lumbosacral region, Kidney stone (2012), Lower extremity edema, PAF (paroxysmal atrial fibrillation) (HCC), Pernicious anemia, Prostate cancer (HCC), Pulmonary nodule, right, and SVT (supraventricular tachycardia) (HCC). He also  has a past surgical history that includes Knee surgery; Gastric bypass (2002); prostate seeding; Carpal tunnel release (Right, 12/23/2014); Carpal tunnel release (Left, 01/06/2015); Colonoscopy with propofol (N/A, 10/08/2017); and Cardioversion (N/A, 05/15/2019).   Pain:  Pain Intensity: Present: 5/10, Best: 3/10, Worst: 10/10 Pain location: Anterior L shoulder Pain Quality: aching and throbbing Radiating: No  Numbness/Tingling: No Focal Weakness: Yes History of prior shoulder or neck/shoulder injury, pain, surgery, or therapy: Yes, chronic bilateral shoulder pain with R TSR. 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 No AROM or WB through LUE (per protocol), Passive elevation and ER in staged ROM to begin at week 4 (elevation 90 degrees weeks 4-6 with no ER;  0-120 weeks 6-8 with ER 0-30 degrees). No pulleys or cane until after 6 weeks, recommend no driving x 6-8 weeks.   Weight Bearing Restrictions: Yes No WB through LUE for at least 6 weeks  Living Environment Lives with: lives with their spouse Lives in: House/apartment  Patient Goals: Improve L shoulder strength and pain. Pt would like to be able to play with/lift his grandson as well as take care of his property.   OBJECTIVE:   Patient Surveys  FOTO: To be completed QuickDASH: 47.7%  Cognition Patient is oriented to person, place, and time.  Recent memory is intact.  Remote memory is intact.  Attention span and concentration are intact.  Expressive speech is intact.  Patient's fund of knowledge is within normal limits for educational  level.    Gross Musculoskeletal Assessment Tremor: None Bulk: Mild swelling noted in L lateral upper arm. Pt denies chills, fevers, redness, or dischrage from inicisions Tone: Normal  Posture Forward head and rounded shoulders  AROM Full L wrist AROM flexion, extension, radial deviation, and ulnar deviation. MCP flexion and extension WNL. Unable to assess L shoulder and elbow AROM per protocol  PROM Full L elbow PROM flexion and extension however during first attempt at slow passive extension pt reports sudden pain in L shoulder which eventually improve after a couple minutes of rest. Full L elbow PROM pronation/supination.   LE MMT: Full L wrist strength for flexion, extension, radial deviation, and ulnar deviation. L grip strength is intact and strong. Deferred testing of L elbow and shoulder strength per protocol  Sensation Grossly intact to light touch throughout LUE as determined by testing dermatomes C2-T2. Proprioception and hot/cold testing deferred on this date.  Palpation Pt is generally tender to light palpation around anterior, lateral, and posterior L shoulder;    TODAY'S TREATMENT    SUBJECTIVE: Pt reports his shoulders  are doing alright today. His L shoulder remains sore but his R shoulder is doing really good. He has undergone his first iron infusion and had bilateral knee injections since his last therapy session. He also had a bilateral MBB and RFA L3-5 which has significantly helped his back pain. His heart converted back to a-fib and he was referred by cardiology to see EP   PAIN: No R shoulder pain reported today at rest, persistent L shoulder soreness.   Ther-ex  All exercises performed sitting fully upright today unless otherwise specified: R shoulder flexion from neutral to 130 degrees with 4# dumbbell (DB) 2 x 10 R shoulder scaption from neutral to around 90 degrees with 4# DB 2 x 10; Seated overhead 4# DB press 2 x 10; R elbow flexion with manual resistance 2 x 15; R elbow extension with manual resistance 2 x 15; R shoulder internal rotation from forearm vertical to stomach with manual resistance from therapist 2 x 15; R shoulder external rotation from stomach to forearm vertical with manual resistance from therapist 2 x 15; L shoulder AROM flexion with straight elbow from neutral to around 130 degrees 2 x 10; L shoulder AROM scaption to around 110 degrees with therapist partially supporting weight of arm 2 x 10; L elbow AROM flexion with light manual resistance 2 x 15; L elbow manually resisted extension 2 x 15; L shoulder IR from vertical to stomach with light manual resistance 2 x 10, no pain reported; L shoulder ER from stomach to vertical with light manual resistance 2 x 10, no pain reported; L shoulder manually resisted isometric extension 3s hold x 10; Ice pack applied to bilateral shoulders at end of session x 5 minutes (unbilled);   Not performed: Seated shoulder extension from 60 flexion to neutral with green tband 2 x 10 BUE; Seated shoulder rows never moving elbows past mid axilla, green tband 2 x 10 BUE;    PATIENT EDUCATION:  Education details:  Pt educated throughout  session about proper posture and technique with exercises. Improved exercise technique, movement at target joints, use of target muscles after min to mod verbal, visual, tactile cues. Person educated: Patient Education method: Explanation, verbal cues Education comprehension: verbalized understanding and returned demonstration   HOME EXERCISE PROGRAM: Access Code: [email or web address] URL: [email or web address] Date: 03/28/2022 Prepared by: Ria Comment  Exercises - Wrist Flexion AROM  - 1 x daily -  7 x weekly - 2 sets - 10 reps - 3s hold - Wrist Extension AROM  - 1 x daily - 7 x weekly - 2 sets - 10 reps - 3s hold - Seated Scapular Retraction  - 1 x daily - 7 x weekly - 2 sets - 10 reps - 3s hold - Standing Elbow Flexion Extension AROM  - 1 x daily - 7 x weekly - 2 sets - 10 reps - 3s hold - Standing Shoulder Flexion to 90 Degrees  - 1 x daily - 7 x weekly - 2 sets - 10 reps - 3s hold - Shoulder Abduction - Thumbs Up  - 1 x daily - 7 x weekly - 2 sets - 10 reps - 3s hold   ASSESSMENT:  CLINICAL IMPRESSION: Pt continues making improvement in bilateral shoulder strength and pain. Repeated strengthening today in fully upright position. Continued AROM for L shoulder against gravity. Overall he continues making excellent progress with RUE and slower with LUE. Pt encouraged to continue HEP and follow-up as scheduled. Plan to progress LUE per protocol as pt is able to tolerate in addition to progressing resistance with RUE strengthening against gravity. He will benefit from skilled PT to address deficits in range of motion and strength in order to improve overall function at home and with leisure activities such as caring for his property.  REHAB POTENTIAL: Good  CLINICAL DECISION MAKING: Evolving/moderate complexity  EVALUATION COMPLEXITY: Moderate   GOALS: Goals reviewed with patient? Yes  SHORT TERM GOALS: Target date: 05/09/2022   Pt will be independent with HEP to improve  strength and decrease right shoulder pain to improve pain-free function at home and work. Baseline:  Goal status: ONGOING   LONG TERM GOALS: Target date: 06/20/22;  Pt will increase L shoulder FOTO to at least 51 to demonstrate significant improvement in function at home and with leisure activities related to L shoulder. Baseline: 01/03/22: To be completed; 01/24/22: 4; 02/28/22: 59; 04/11/22: 55; Goal status: ACHIEVED  2.  Pt will decrease worst L shoulder pain by at least 3 points on the NPRS in order to demonstrate clinically significant reduction in R shoulder pain. Baseline: 01/03/22: worst: 10/10; 02/28/22: 10/10; 04/11/22: 4/10; Goal status: ACHIEVED  3.  Pt will decrease quick DASH score by at least 8% in order to demonstrate clinically significant reduction in disability related to L shoulder pain        Baseline: 01/03/22: 47.7%; 02/28/22: 65.91%; 04/11/22: 56.8%; Goal status: ONGOING  4. Pt will demonstrate L shoulder flexion, abduction, IR, and ER strength of at least 4/5 in order to demonstrate improvement in strength and function       Baseline: 01/03/22: Unable to test per protocol; 02/28/22: At least 3/5 but unable to fully test due to protocol; 04/11/22: flexion: 4/5, abduction: 4-/5 (IR and ER at least 3/5 but not tested); Goal status: PARTIALLY MET  PLAN: PT FREQUENCY: 1-2x/week  PT DURATION: 12 weeks  PLANNED INTERVENTIONS: Therapeutic exercises, Therapeutic activity, Neuromuscular re-education, Balance training, Gait training, Patient/Family education, Joint manipulation, Joint mobilization, Vestibular training, Canalith repositioning, Aquatic Therapy, Dry Needling, Electrical stimulation, Spinal manipulation, Spinal mobilization, Cryotherapy, Moist heat, Traction, Ultrasound, Ionotophoresis /ml Dexamethasone, and Manual therapy  PLAN FOR NEXT SESSION: Progress L shoulder ROM and strength per protocol, continue R shoulder strengthening   Sharalyn Ink Talaysia Pinheiro PT, DPT, GCS   Physical Therapist- [place]  [facility]

## 2022-05-03 NOTE — H&P (View-Only) (Signed)
     ELECTROPHYSIOLOGY CONSULT NOTE  Patient ID: [name], MRN: [id number], DOB/AGE: 69/27/1957 66 y.o. Admit date: (Not on file) Date of Consult: 05/04/2022  Primary Physician: Karamalegos, Alexander J, DO Primary Cardiologist: TG     [name] is a 66 y.o. male who is being seen today for the evaluation of AFib at the request of TG.    HPI [name] is a 66 y.o. male has a long history of atrial fibrillation BMET 20+ years.  Remote gastric bypass with significant weight loss, weight regain and now weight losing.  Recurrences of atrial fibrillation have been Associated symptoms include fatigue breathlessness.  He has been on amiodarone since having undergone cardioversion in 2016 and then again in 2021 before 2024.  Recently he has been diagnosed with significant anemia found following complaints of bright red blood per rectum.  He has a remote history of hemorrhoids.  This has been the described reason.  Ferritin most recently was 6 he is seeing hematology and GI consultation is pending  Shortness of breath and peripheral edema is noted  Denies sleep apnea.  No alcohol.  Morbidly obese  DATE TEST EF   12/15 Echo   45-50 %   10/16 CTA  CAScore 224  6/21 Echo   55 % LAE moderate 4.7 cm        Date Cr K Hgb  4/21     13.1  3/23 1.13 4.4 10.7<<10.0    Antiarrhythmics Date Reason stopped  Amiodarone 2016 Failure 3/24 DC by TG, resumed by SK             Past Medical History:  Diagnosis Date   Anxiety    Aortic atherosclerosis    Cardiomyopathy (in setting of Afib)    a.) TTE 12/26/2013: EF 45-50%, mild ant and antsept HK. mild MR. Mod dil LA. nl RV fxn. Rhythm was Afib; b.) TTE 07/04/2019: EF 55%, mid-apical anteroseptal HK, mild MAC, mild Ao sclerosis, G2DD, RVSP 45.3   Chronic pain syndrome    a.) followed by pain management   Chronic, continuous use of opioids    a.) hydrocodone/APAP 7.5/325 mg; followed by pain management   Coronary artery disease  11/06/2014   a.) cCTA 11/06/2014: Ca score 224 (all in pLAD) -- 74th percentile for age/sex matched control   Diverticulosis    DJD (degenerative joint disease) of knee    History of hiatal hernia    History of kidney stones 2012   Hyperlipidemia    Hyperplastic colon polyp    Hypertension    Internal hemorrhoids    Intervertebral disc disorder with radiculopathy of lumbosacral region    Long term current use FULL DOSE (325 mg) aspirin    Long term current use of amiodarone    Long term current use of antithrombotics/antiplatelets    a.) rivaroxaban   Lower extremity edema    Mixed hyperlipidemia    Myalgia due to statin    Osteoarthritis    PAF (paroxysmal atrial fibrillation)    a.) CHA2DS2VASc = 4 (age, HTN, CHF, vascular disease history);  b.) s/p DCCV 03/13/2014 (200 J x1); c.) s/p DCCV 05/15/2019 (150 J x 1, 200 J x2); d.) rate/rhythm maintained on oral amiodarone + metoprolol succinate; chronically anticoagulated with rivaroxaban   Pernicious anemia    Prostate cancer    Pulmonary nodule, right    a. 10/2014 Cardiac CTA: 7mm RLL nodule; b. 04/2015 CT Chest: stable 7mm RLL nodule. No new   nodules; 02/2017 CTA Chest: stable, benign, 7mm RLL pulm nodule.   Sleep difficulties    a.) takes melatonin + trazodone PRN   SVT (supraventricular tachycardia)    Tubular adenoma of colon       Surgical History:  Past Surgical History:  Procedure Laterality Date   BICEPT TENODESIS Right 09/27/2021   Procedure: Right reverse shoulder arthroplasty, biceps tenodesis;  Surgeon: Patel, Sunny, MD;  Location: [facility];  Service: Orthopedics;  Laterality: Right;   CARDIOVERSION N/A 05/15/2019   Procedure: CARDIOVERSION;  Surgeon: Gollan, Timothy J, MD;  Location: [facility];  Service: Cardiovascular;  Laterality: N/A;   CARDIOVERSION N/A 03/13/2014   Procedure: CARDIOVERSION; Location: [facility]; Surgeon: Timothy Gollan, MD   CARDIOVERSION N/A 03/24/2022   Procedure: CARDIOVERSION;  Surgeon: Gollan,  Timothy J, MD;  Location: [facility];  Service: Cardiovascular;  Laterality: N/A;   CARPAL TUNNEL RELEASE Right 12/23/2014   Procedure: CARPAL TUNNEL RELEASE;  Surgeon: Howard Miller, MD;  Location: [facility];  Service: Orthopedics;  Laterality: Right;   CARPAL TUNNEL RELEASE Left 01/06/2015   Procedure: CARPAL TUNNEL RELEASE;  Surgeon: Howard Miller, MD;  Location: [facility];  Service: Orthopedics;  Laterality: Left;   COLONOSCOPY WITH PROPOFOL N/A 10/08/2017   Procedure: COLONOSCOPY WITH PROPOFOL;  Surgeon: Vanga, Rohini Reddy, MD;  Location: [facility];  Service: Gastroenterology;  Laterality: N/A;   GASTRIC BYPASS  01/17/2000   KNEE SURGERY Right    knee trauma x3   prostate seeding     REVERSE SHOULDER ARTHROPLASTY Right 09/27/2021   Procedure: Right reverse shoulder arthroplasty, biceps tenodesis;  Surgeon: Patel, Sunny, MD;  Location: [facility];  Service: Orthopedics;  Laterality: Right;   SHOULDER ARTHROSCOPY WITH ROTATOR CUFF REPAIR AND OPEN BICEPS TENODESIS Left 12/27/2021   Procedure: Left shoulder arthroscopic cuff repair (supraspinatus and subscapularis) with Regeneten Patch application;  Surgeon: Patel, Sunny, MD;  Location: [facility];  Service: Orthopedics;  Laterality: Left;     Home Meds: Current Meds  Medication Sig   amiodarone (PACERONE) 200 MG tablet Take 2 tablets (400 mg) by mouth twice a day x 2 weeks   benazepril (LOTENSIN) 40 MG tablet TAKE ONE TABLET BY MOUTH ONE TIME DAILY (Patient taking differently: Take 40 mg by mouth every morning. TAKE ONE TABLET BY MOUTH ONE TIME DAILY)   busPIRone (BUSPAR) 10 MG tablet TAKE ONE TABLET BY MOUTH FOUR TIMES A DAY AS NEEDED FOR ANXIETY   Calcium-Magnesium-Vitamin D (OPURITY CALCIUM CITRATE PLUS) 300-20-200 MG-MG-UNIT CHEW Chew 2 tablets by mouth daily. Silver leaf brand 300 Mg supplement   carboxymethylcellulose (REFRESH PLUS) 0.5 % SOLN 1 drop 3 (three) times daily as needed.   CHIA SEED PO Take 2 capsules by mouth daily. 750 mg  supplement  2 by mouth in the morning.   cyanocobalamin (VITAMIN B12) 1000 MCG/ML injection Inject 1 mL (1,000 mcg total) into the skin every 30 (thirty) days. INJECT 1ML EVERY 30 DAYS AS DIRECTED   cyclobenzaprine (FLEXERIL) 10 MG tablet TAKE ONE TABLET BY MOUTH THREE TIMES A DAY AS NEEDED FOR MUSCLE SPASM   DULoxetine (CYMBALTA) 30 MG capsule TAKE ONE CAPSULE BY MOUTH ONE TIME DAILY   ferrous sulfate 325 (65 FE) MG EC tablet Take 325 mg by mouth 3 (three) times daily with meals.   [START ON 05/21/2022] HYDROcodone-acetaminophen (NORCO) 7.5-325 MG tablet Take 1 tablet by mouth every 6 (six) hours as needed for severe pain. Must last 30 days   [START ON 06/20/2022] HYDROcodone-acetaminophen (NORCO) 7.5-325 MG tablet Take 1   tablet by mouth every 6 (six) hours as needed for severe pain. Must last 30 days   [START ON 07/20/2022] HYDROcodone-acetaminophen (NORCO) 7.5-325 MG tablet Take 1 tablet by mouth every 6 (six) hours as needed for severe pain. Must last 30 days   melatonin 3 MG TABS tablet Take 3 mg by mouth at bedtime.   metoprolol succinate (TOPROL-XL) 50 MG 24 hr tablet TAKE ONE TABLET BY MOUTH ONE TIME DAILY WITH OR IMMEDIATELY FOLLOWING A MEAL   Multiple Vitamin (MULTIVITAMIN WITH MINERALS) TABS tablet Take 1 tablet by mouth daily.   rivaroxaban (XARELTO) 20 MG TABS tablet TAKE ONE TABLET BY MOUTH ONE TIME DAILY WITH SUPPER   traZODone (DESYREL) 150 MG tablet TAKE ONE TABLET BY MOUTH AT BEDTIME   Vitamin D, Ergocalciferol, (DRISDOL) 1.25 MG (50000 UNIT) CAPS capsule Take 50,000 Units by mouth once a week.    Allergies:  Allergies  Allergen Reactions   Bupivacaine Liposome Palpitations    Social History   Socioeconomic History   Marital status: Married    Spouse name: Norma   Number of children: Not on file   Years of education: Not on file   Highest education level: Not on file  Occupational History   Not on file  Tobacco Use   Smoking status: Former    Packs/day: 1.00    Years:  10.00    Additional pack years: 0.00    Total pack years: 10.00    Types: Cigarettes    Quit date: 02/18/1979    Years since quitting: 43.2   Smokeless tobacco: Former    Types: Snuff  Vaping Use   Vaping Use: Never used  Substance and Sexual Activity   Alcohol use: Not Currently    Comment: last drink in 2021   Drug use: No   Sexual activity: Not on file  Other Topics Concern   Not on file  Social History Narrative   Lives in [place]; with wife. 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    Social Determinants of Health   Financial Resource Strain: Low Risk  (02/24/2022)   Overall Financial Resource Strain (CARDIA)    Difficulty of Paying Living Expenses: Not hard at all  Food Insecurity: No Food Insecurity (04/26/2022)   Hunger Vital Sign    Worried About Running Out of Food in the Last Year: Never true    Ran Out of Food in the Last Year: Never true  Transportation Needs: No Transportation Needs (02/24/2022)   PRAPARE - Transportation    Lack of Transportation (Medical): No    Lack of Transportation (Non-Medical): No  Physical Activity: Insufficiently Active (02/24/2022)   Exercise Vital Sign    Days of Exercise per Week: 2 days    Minutes of Exercise per Session: 60 min  Stress: No Stress Concern Present (02/24/2022)   [employer] - Occupational Stress Questionnaire    Feeling of Stress : Not at all  Social Connections: Moderately Integrated (02/24/2022)   Social Connection and Isolation Panel [NHANES]    Frequency of Communication with Friends and Family: More than three times a week    Frequency of Social Gatherings with Friends and Family: More than three times a week    Attends Religious Services: More than 4 times per year    Active Member of Clubs or Organizations: No    Attends Club or Organization Meetings: Never    Marital Status: Married  Intimate   Partner Violence: Not At Risk (04/26/2022)    Humiliation, Afraid, Rape, and Kick questionnaire    Fear of Current or Ex-Partner: No    Emotionally Abused: No    Physically Abused: No    Sexually Abused: No     Family History  Problem Relation Age of Onset   Heart disease Father    Hypertension Father    Stroke Father    Cancer Father        prostate cancer   Cancer Sister    Arrhythmia Sister        A-fib   Arrhythmia Brother        A-fib   Cancer Brother      ROS:  Please see the history of present illness.     All other systems reviewed and negative.    Physical Exam: Blood pressure 118/74, pulse 62, height 6' (1.829 m), weight (!) 338 lb (153.3 kg), SpO2 97 %. General: Well developed, well nourished male in no acute distress. Head: Normocephalic, atraumatic, sclera non-icteric, no xanthomas, nares are without discharge. EENT: normal  Lymph Nodes:  none Neck: Negative for carotid bruits. JVD not elevated. Back:without scoliosis kyphosis Lungs: Clear bilaterally to auscultation without wheezes, rales, or rhonchi. Breathing is unlabored. Heart: Irregularly irregular rate and rhythm with S1 S2. No  murmur . No rubs, or gallops appreciated. Abdomen: Soft, non-tender, non-distended with normoactive bowel sounds. No hepatomegaly. No rebound/guarding. No obvious abdominal masses. Msk:  Strength and tone appear normal for age. Extremities: No clubbing or cyanosis. 1 + edema.  Distal pedal pulses are 2+ and equal bilaterally. Skin: Warm and Dry Neuro: Alert and oriented X 3. CN III-XII intact Grossly normal sensory and motor function . Psych:  Responds to questions appropriately with a normal affect.        EKG: 4/16 atrial fibrillation at 72   Assessment and Plan:  Atrial Fibrillation  persistent   Anemia secondary to GI blood loss question cause possible hemorrhoids  Morbid obesity  Amiodarone therapy x 8 years   The patient has persistent atrial fibrillation and did not hold following most recent  cardioversion but for few weeks.  His anemia may have contributed to this and so as this is being addressed, we will reload him with amiodarone and attempt cardioversion again in a couple of weeks.  I am perhaps more sanguine to Dr. TG that this will be of some benefit in the short-intermediate term.  He may well be right however, which begs the question as to next options.  Unfortunately, dofetilide or sotalol will not be an option for some time given the long time exposure to amiodarone.  Will need to reassess left ventricular function and size prior to deciding about the possibility of a 1C agent.  Catheter ablation might be our other option albeit with some degree of reluctance given his morbid obesity.  I suspect he has sleep apnea also and a sleep study will be recommended to his PCP  Will reassess left ventricular function and left chamber volumes  Ongoing evaluation for his breathing.     Shelbey Spindler  

## 2022-05-03 NOTE — Progress Notes (Unsigned)
ELECTROPHYSIOLOGY CONSULT NOTE  Patient ID: [name], MRN: [id number], DOB/AGE: 1955-09-15 67 y.o. Admit date: (Not on file) Date of Consult: 05/04/2022  Primary Physician: Smitty Cords, DO Primary Cardiologist: [name] is a 67 y.o. male who is being seen today for the evaluation of AFib at the request of TG.    HPI [name] is a 67 y.o. male has a long history of atrial fibrillation BMET 20+ years.  Remote gastric bypass with significant weight loss, weight regain and now weight losing.  Recurrences of atrial fibrillation have been Associated symptoms include fatigue breathlessness.  He has been on amiodarone since having undergone cardioversion in 2016 and then again in 2021 before 2024.  Recently he has been diagnosed with significant anemia found following complaints of bright red blood per rectum.  He has a remote history of hemorrhoids.  This has been the described reason.  Ferritin most recently was 6 he is seeing hematology and GI consultation is pending  Shortness of breath and peripheral edema is noted  Denies sleep apnea.  No alcohol.  Morbidly obese  DATE TEST EF   12/15 Echo   45-50 %   10/16 CTA  CAScore 224  6/21 Echo   55 % LAE moderate 4.7 cm        Date Cr K Hgb  4/21     13.1  3/23 1.13 4.4 10.7<<10.0    Antiarrhythmics Date Reason stopped  Amiodarone 2016 Failure 3/24 DC by TG, resumed by SK             Past Medical History:  Diagnosis Date   Anxiety    Aortic atherosclerosis    Cardiomyopathy (in setting of Afib)    a.) TTE 12/26/2013: EF 45-50%, mild ant and antsept HK. mild MR. Mod dil LA. nl RV fxn. Rhythm was Afib; b.) TTE 07/04/2019: EF 55%, mid-apical anteroseptal HK, mild MAC, mild Ao sclerosis, G2DD, RVSP 45.3   Chronic pain syndrome    a.) followed by pain management   Chronic, continuous use of opioids    a.) hydrocodone/APAP 7.5/325 mg; followed by pain management   Coronary artery disease  11/06/2014   a.) cCTA 11/06/2014: Ca score 224 (all in pLAD) -- 74th percentile for age/sex matched control   Diverticulosis    DJD (degenerative joint disease) of knee    History of hiatal hernia    History of kidney stones 2012   Hyperlipidemia    Hyperplastic colon polyp    Hypertension    Internal hemorrhoids    Intervertebral disc disorder with radiculopathy of lumbosacral region    Long term current use FULL DOSE (325 mg) aspirin    Long term current use of amiodarone    Long term current use of antithrombotics/antiplatelets    a.) rivaroxaban   Lower extremity edema    Mixed hyperlipidemia    Myalgia due to statin    Osteoarthritis    PAF (paroxysmal atrial fibrillation)    a.) CHA2DS2VASc = 4 (age, HTN, CHF, vascular disease history);  b.) s/p DCCV 03/13/2014 (200 J x1); c.) s/p DCCV 05/15/2019 (150 J x 1, 200 J x2); d.) rate/rhythm maintained on oral amiodarone + metoprolol succinate; chronically anticoagulated with rivaroxaban   Pernicious anemia    Prostate cancer    Pulmonary nodule, right    a. 10/2014 Cardiac CTA: 7mm RLL nodule; b. 04/2015 CT Chest: stable 7mm RLL nodule. No new  nodules; 02/2017 CTA Chest: stable, benign, 44mm RLL pulm nodule.   Sleep difficulties    a.) takes melatonin + trazodone PRN   SVT (supraventricular tachycardia)    Tubular adenoma of colon       Surgical History:  Past Surgical History:  Procedure Laterality Date   BICEPT TENODESIS Right 09/27/2021   Procedure: Right reverse shoulder arthroplasty, biceps tenodesis;  Surgeon: Signa Kell, MD;  Location: [facility];  Service: Orthopedics;  Laterality: Right;   CARDIOVERSION N/A 05/15/2019   Procedure: CARDIOVERSION;  Surgeon: Antonieta Iba, MD;  Location: [facility];  Service: Cardiovascular;  Laterality: N/A;   CARDIOVERSION N/A 03/13/2014   Procedure: CARDIOVERSION; Location: [facility]; Surgeon: [name], MD   CARDIOVERSION N/A 03/24/2022   Procedure: CARDIOVERSION;  Surgeon: Antonieta Iba, MD;  Location: [facility];  Service: Cardiovascular;  Laterality: N/A;   CARPAL TUNNEL RELEASE Right 12/23/2014   Procedure: CARPAL TUNNEL RELEASE;  Surgeon: Deeann Saint, MD;  Location: [facility];  Service: Orthopedics;  Laterality: Right;   CARPAL TUNNEL RELEASE Left 01/06/2015   Procedure: CARPAL TUNNEL RELEASE;  Surgeon: Deeann Saint, MD;  Location: [facility];  Service: Orthopedics;  Laterality: Left;   COLONOSCOPY WITH PROPOFOL N/A 10/08/2017   Procedure: COLONOSCOPY WITH PROPOFOL;  Surgeon: Toney Reil, MD;  Location: [facility];  Service: Gastroenterology;  Laterality: N/A;   GASTRIC BYPASS  01/17/2000   KNEE SURGERY Right    knee trauma x3   prostate seeding     REVERSE SHOULDER ARTHROPLASTY Right 09/27/2021   Procedure: Right reverse shoulder arthroplasty, biceps tenodesis;  Surgeon: Signa Kell, MD;  Location: [facility];  Service: Orthopedics;  Laterality: Right;   SHOULDER ARTHROSCOPY WITH ROTATOR CUFF REPAIR AND OPEN BICEPS TENODESIS Left 12/27/2021   Procedure: Left shoulder arthroscopic cuff repair (supraspinatus and subscapularis) with Regeneten Patch application;  Surgeon: Signa Kell, MD;  Location: [facility];  Service: Orthopedics;  Laterality: Left;     Home Meds: Current Meds  Medication Sig   amiodarone (PACERONE) 200 MG tablet Take 2 tablets (400 mg) by mouth twice a day x 2 weeks   benazepril (LOTENSIN) 40 MG tablet TAKE ONE TABLET BY MOUTH ONE TIME DAILY (Patient taking differently: Take 40 mg by mouth every morning. TAKE ONE TABLET BY MOUTH ONE TIME DAILY)   busPIRone (BUSPAR) 10 MG tablet TAKE ONE TABLET BY MOUTH FOUR TIMES A DAY AS NEEDED FOR ANXIETY   Calcium-Magnesium-Vitamin D (OPURITY CALCIUM CITRATE PLUS) 300-20-200 MG-MG-UNIT CHEW Chew 2 tablets by mouth daily. Silver leaf brand 300 Mg supplement   carboxymethylcellulose (REFRESH PLUS) 0.5 % SOLN 1 drop 3 (three) times daily as needed.   CHIA SEED PO Take 2 capsules by mouth daily. 750 mg  supplement  2 by mouth in the morning.   cyanocobalamin (VITAMIN B12) 1000 MCG/ML injection Inject 1 mL (1,000 mcg total) into the skin every 30 (thirty) days. INJECT EVERY 30 DAYS AS DIRECTED   cyclobenzaprine (FLEXERIL) 10 MG tablet TAKE ONE TABLET BY MOUTH THREE TIMES A DAY AS NEEDED FOR MUSCLE SPASM   DULoxetine (CYMBALTA) 30 MG capsule TAKE ONE CAPSULE BY MOUTH ONE TIME DAILY   ferrous sulfate 325 (65 FE) MG EC tablet Take 325 mg by mouth 3 (three) times daily with meals.   [START ON 05/21/2022] HYDROcodone-acetaminophen (NORCO) 7.5-325 MG tablet Take 1 tablet by mouth every 6 (six) hours as needed for severe pain. Must last 30 days   [START ON 06/20/2022] HYDROcodone-acetaminophen (NORCO) 7.5-325 MG tablet Take 1  tablet by mouth every 6 (six) hours as needed for severe pain. Must last 30 days   [START ON 07/20/2022] HYDROcodone-acetaminophen (NORCO) 7.5-325 MG tablet Take 1 tablet by mouth every 6 (six) hours as needed for severe pain. Must last 30 days   melatonin 3 MG TABS tablet Take 3 mg by mouth at bedtime.   metoprolol succinate (TOPROL-XL) 50 MG 24 hr tablet TAKE ONE TABLET BY MOUTH ONE TIME DAILY WITH OR IMMEDIATELY FOLLOWING A MEAL   Multiple Vitamin (MULTIVITAMIN WITH MINERALS) TABS tablet Take 1 tablet by mouth daily.   rivaroxaban (XARELTO) 20 MG TABS tablet TAKE ONE TABLET BY MOUTH ONE TIME DAILY WITH SUPPER   traZODone (DESYREL) 150 MG tablet TAKE ONE TABLET BY MOUTH AT BEDTIME   Vitamin D, Ergocalciferol, (DRISDOL) 1.25 MG (50000 UNIT) CAPS capsule Take 50,000 Units by mouth once a week.    Allergies:  Allergies  Allergen Reactions   Bupivacaine Liposome Palpitations    Social History   Socioeconomic History   Marital status: Married    Spouse name: Nelva Bush   Number of children: Not on file   Years of education: Not on file   Highest education level: Not on file  Occupational History   Not on file  Tobacco Use   Smoking status: Former    Packs/day: 1.00    Years:  10.00    Additional pack years: 0.00    Total pack years: 10.00    Types: Cigarettes    Quit date: 02/18/1979    Years since quitting: 43.2   Smokeless tobacco: Former    Types: Snuff  Vaping Use   Vaping Use: Never used  Substance and Sexual Activity   Alcohol use: Not Currently    Comment: last drink in 2021   Drug use: No   Sexual activity: Not on file  Other Topics Concern   Not on file  Social History Narrative   Lives in [place]; with wife. Retd from [employer]; quit in 1980; used to drink alcohol; no beer in last 2022.    Social Determinants of Health   Financial Resource Strain: Low Risk  (02/24/2022)   Overall Financial Resource Strain (CARDIA)    Difficulty of Paying Living Expenses: Not hard at all  Food Insecurity: No Food Insecurity (04/26/2022)   Hunger Vital Sign    Worried About Running Out of Food in the Last Year: Never true    Ran Out of Food in the Last Year: Never true  Transportation Needs: No Transportation Needs (02/24/2022)   PRAPARE - Administrator, Civil Service (Medical): No    Lack of Transportation (Non-Medical): No  Physical Activity: Insufficiently Active (02/24/2022)   Exercise Vital Sign    Days of Exercise per Week: 2 days    Minutes of Exercise per Session: 60 min  Stress: No Stress Concern Present (02/24/2022)   [employer] of Occupational Health - Occupational Stress Questionnaire    Feeling of Stress : Not at all  Social Connections: Moderately Integrated (02/24/2022)   Social Connection and Isolation Panel [NHANES]    Frequency of Communication with Friends and Family: More than three times a week    Frequency of Social Gatherings with Friends and Family: More than three times a week    Attends Religious Services: More than 4 times per year    Active Member of [employer] or Organizations: No    Attends Banker Meetings: Never    Marital Status: Married  Intimate  Partner Violence: Not At Risk (04/26/2022)    Humiliation, Afraid, Rape, and Kick questionnaire    Fear of Current or Ex-Partner: No    Emotionally Abused: No    Physically Abused: No    Sexually Abused: No     Family History  Problem Relation Age of Onset   Heart disease Father    Hypertension Father    Stroke Father    Cancer Father        prostate cancer   Cancer Sister    Arrhythmia Sister        A-fib   Arrhythmia Brother        A-fib   Cancer Brother      ROS:  Please see the history of present illness.     All other systems reviewed and negative.    Physical Exam: Blood pressure 118/74, pulse 62, height 6' (1.829 m), weight (!) 338 lb (153.3 kg), SpO2 97 %. General: Well developed, well nourished male in no acute distress. Head: Normocephalic, atraumatic, sclera non-icteric, no xanthomas, nares are without discharge. EENT: normal  Lymph Nodes:  none Neck: Negative for carotid bruits. JVD not elevated. Back:without scoliosis kyphosis Lungs: Clear bilaterally to auscultation without wheezes, rales, or rhonchi. Breathing is unlabored. Heart: Irregularly irregular rate and rhythm with S1 S2. No  murmur . No rubs, or gallops appreciated. Abdomen: Soft, non-tender, non-distended with normoactive bowel sounds. No hepatomegaly. No rebound/guarding. No obvious abdominal masses. Msk:  Strength and tone appear normal for age. Extremities: No clubbing or cyanosis. 1 + edema.  Distal pedal pulses are 2+ and equal bilaterally. Skin: Warm and Dry Neuro: Alert and oriented X 3. CN III-XII intact Grossly normal sensory and motor function . Psych:  Responds to questions appropriately with a normal affect.        EKG: 4/16 atrial fibrillation at 72   Assessment and Plan:  Atrial Fibrillation  persistent   Anemia secondary to GI blood loss question cause possible hemorrhoids  Morbid obesity  Amiodarone therapy x 8 years   The patient has persistent atrial fibrillation and did not hold following most recent  cardioversion but for few weeks.  His anemia may have contributed to this and so as this is being addressed, we will reload him with amiodarone and attempt cardioversion again in a couple of weeks.  I am perhaps more sanguine to Dr. Knute Neu that this will be of some benefit in the short-intermediate term.  He may well be right however, which begs the question as to next options.  Unfortunately, dofetilide or sotalol will not be an option for some time given the long time exposure to amiodarone.  Will need to reassess left ventricular function and size prior to deciding about the possibility of a 1C agent.  Catheter ablation might be our other option albeit with some degree of reluctance given his morbid obesity.  I suspect he has sleep apnea also and a sleep study will be recommended to his PCP  Will reassess left ventricular function and left chamber volumes  Ongoing evaluation for his breathing.     [name]

## 2022-05-04 ENCOUNTER — Encounter: Payer: Self-pay | Admitting: Internal Medicine

## 2022-05-04 ENCOUNTER — Ambulatory Visit: Payer: Medicare Other | Attending: Internal Medicine | Admitting: Internal Medicine

## 2022-05-04 VITALS — BP 118/74 | HR 62 | Ht 72.0 in | Wt 338.0 lb

## 2022-05-04 DIAGNOSIS — I4819 Other persistent atrial fibrillation: Secondary | ICD-10-CM | POA: Diagnosis not present

## 2022-05-04 DIAGNOSIS — Z01812 Encounter for preprocedural laboratory examination: Secondary | ICD-10-CM | POA: Diagnosis not present

## 2022-05-04 MED ORDER — AMIODARONE HCL 200 MG PO TABS: ORAL_TABLET | ORAL | 0 refills | Status: DC

## 2022-05-04 NOTE — Patient Instructions (Signed)
Medication Instructions:  - Your physician has recommended you make the following change in your medication:   1) Amiodarone 200 mg: - take 2 tablets (400 mg) by mouth TWICE daily x 2 weeks - then resume 1 tablet (200 mg) by mouth once daily  *If you need a refill on your cardiac medications before your next appointment, please call your pharmacy*   Lab Work: - Your physician recommends that you return for lab work in: 1 week  BMP/ CBC  Medical Mall Entrance at [facility] 1st desk on the right to check in (REGISTRATION)  Lab hours: Monday- Friday (7:30 am- 5:30 pm)   If you have labs (blood work) drawn today and your tests are completely normal, you will receive your results only by: MyChart Message (if you have MyChart) OR A paper copy in the mail If you have any lab test that is abnormal or we need to change your treatment, we will call you to review the results.   Testing/Procedures:  1)Echocardiogram: - Your physician has requested that you have an echocardiogram. Echocardiography is a painless test that uses sound waves to create images of your heart. It provides your doctor with information about the size and shape of your heart and how well your heart's chambers and valves are working. This procedure takes approximately one hour. There are no restrictions for this procedure. Please do NOT wear cologne, perfume, aftershave, or lotions (deodorant is allowed). Please arrive 15 minutes prior to your appointment time.   2) Cardioversion: - Your physician has recommended that you have a Cardioversion (DCCV). Electrical Cardioversion uses a jolt of electricity to your heart either through paddles or wired patches attached to your chest. This is a controlled, usually prescheduled, procedure. Defibrillation is done under light anesthesia in the hospital, and you usually go home the day of the procedure. This is done to get your heart back into a normal rhythm. You are not awake for the  procedure.   You are scheduled for a Cardioversion on Friday 05/19/22 with Dr. Mariah Milling.    Please arrive at the [facility] Entrance of [facility], [address], [place] [address] at 7:00 am (This is 1 hour prior to your procedure time).  Proceed to the Check-In Desk directly inside the entrance.  Procedure Parking: Use the entrance off of the [address] side of the hospital. Turn right upon entering and follow the driveway to parking that is directly in front of the Heart & Vascular Center. There is no valet parking available at this entrance, however there is an awning directly in front of the Heart & Vascular Center for drop off/ pick up for patients  DIET: Nothing to eat or drink after midnight except a sip of water with medications (see medication instructions below)  Medication Instructions: You may take all of your regular morning medications with enough water to get them down safely the day of your procedure Continue your anticoagulant: Xarelto If you miss a dose, please call us at [phone] You will need to continue your anticoagulant after your procedure until you are told by your provider that it is safe to stop.    FYI: For your safety, and to allow [place] to monitor your vital signs accurately during the surgery/procedure we request that if you have artificial nails, gel coating, SNS etc. Please have those removed prior to your surgery/procedure. Not having the nail coverings /polish removed may result in cancellation or delay of your surgery/procedure.  You  must have a responsible person to drive you home and stay in the waiting area during your procedure. Failure to do so could result in cancellation.  Bring your insurance cards.  If you have any questions after you get home, please call the office at [phone]  *Special Note: Every effort is made to have your procedure done on time. Occasionally there are emergencies that occur at the hospital that may cause  delays. Please be patient if a delay does occur.        Follow-Up: At [facility], you and your health needs are our priority.  As part of our continuing mission to provide you with exceptional heart care, we have created designated Provider Care Teams.  These Care Teams include your primary Cardiologist (physician) and Advanced Practice Providers (APPs -  Physician Assistants and Nurse Practitioners) who all work together to provide you with the care you need, when you need it.  We recommend signing up for the patient portal called "MyChart".  Sign up information is provided on this After Visit Summary.  MyChart is used to connect with patients for Virtual Visits (Telemedicine).  Patients are able to view lab/test results, encounter notes, upcoming appointments, etc.  Non-urgent messages can be sent to your provider as well.   To learn more about what you can do with MyChart, go to [email or web address].    Your next appointment:   8 week(s)  Provider:   [name], MD    Other Instructions N/a

## 2022-05-05 ENCOUNTER — Inpatient Hospital Stay: Payer: Medicare Other

## 2022-05-05 VITALS — BP 151/86 | HR 94 | Temp 96.9°F | Resp 17

## 2022-05-05 DIAGNOSIS — R5383 Other fatigue: Secondary | ICD-10-CM | POA: Diagnosis not present

## 2022-05-05 DIAGNOSIS — D649 Anemia, unspecified: Secondary | ICD-10-CM | POA: Diagnosis not present

## 2022-05-05 DIAGNOSIS — I2510 Atherosclerotic heart disease of native coronary artery without angina pectoris: Secondary | ICD-10-CM | POA: Diagnosis not present

## 2022-05-05 DIAGNOSIS — I4891 Unspecified atrial fibrillation: Secondary | ICD-10-CM | POA: Diagnosis not present

## 2022-05-05 DIAGNOSIS — Z7901 Long term (current) use of anticoagulants: Secondary | ICD-10-CM | POA: Diagnosis not present

## 2022-05-05 DIAGNOSIS — K649 Unspecified hemorrhoids: Secondary | ICD-10-CM | POA: Diagnosis not present

## 2022-05-05 MED ORDER — SODIUM CHLORIDE 0.9 % IV SOLN
Freq: Once | INTRAVENOUS | Status: AC
  Filled 2022-05-05: qty 250

## 2022-05-05 MED ORDER — SODIUM CHLORIDE 0.9 % IV SOLN
200.0000 mg | Freq: Once | INTRAVENOUS | Status: AC
  Administered 2022-05-05: 200 mg via INTRAVENOUS
  Filled 2022-05-05: qty 200

## 2022-05-05 NOTE — Patient Instructions (Signed)
[facility] [place] [facility]  Discharge Instructions: Thank you for choosing [facility] to provide your oncology and hematology care.  If you have a lab appointment with the Cancer Center, please go directly to the Cancer Center and check in at the registration area.  Wear comfortable clothing and clothing appropriate for easy access to any Portacath or PICC line.   We strive to give you quality time with your provider. You may need to reschedule your appointment if you arrive late (15 or more minutes).  Arriving late affects you and other patients whose appointments are after yours.  Also, if you miss three or more appointments without notifying the office, you may be dismissed from the clinic at the provider's discretion.      For prescription refill requests, have your pharmacy contact our office and allow 72 hours for refills to be completed.    Today you received the following chemotherapy and/or immunotherapy agents Venofer.      To help prevent nausea and vomiting after your treatment, we encourage you to take your nausea medication as directed.  BELOW ARE SYMPTOMS THAT SHOULD BE REPORTED IMMEDIATELY: *FEVER GREATER THAN 100.4 F (38 C) OR HIGHER *CHILLS OR SWEATING *NAUSEA AND VOMITING THAT IS NOT CONTROLLED WITH YOUR NAUSEA MEDICATION *UNUSUAL SHORTNESS OF BREATH *UNUSUAL BRUISING OR BLEEDING *URINARY PROBLEMS (pain or burning when urinating, or frequent urination) *BOWEL PROBLEMS (unusual diarrhea, constipation, pain near the anus) TENDERNESS IN MOUTH AND THROAT WITH OR WITHOUT PRESENCE OF ULCERS (sore throat, sores in mouth, or a toothache) UNUSUAL RASH, SWELLING OR PAIN  UNUSUAL VAGINAL DISCHARGE OR ITCHING   Items with * indicate a potential emergency and should be followed up as soon as possible or go to the Emergency Department if any problems should occur.  Please show the CHEMOTHERAPY ALERT CARD or IMMUNOTHERAPY ALERT CARD at check-in to  the Emergency Department and triage nurse.  Should you have questions after your visit or need to cancel or reschedule your appointment, please contact [facility] [place] [facility]  [phone] and follow the prompts.  Office hours are 8:00 a.m. to 4:30 p.m. Monday - Friday. Please note that voicemails left after 4:00 p.m. may not be returned until the following business day.  We are closed weekends and major holidays. You have access to a nurse at all times for urgent questions. Please call the main number to the clinic [phone] and follow the prompts.  For any non-urgent questions, you may also contact your provider using MyChart. We now offer e-Visits for anyone 18 and older to request care online for non-urgent symptoms. For details visit [email or web address].   Also download the MyChart app! Go to the app store, search "MyChart", open the app, select [facility], and log in with your MyChart username and password.   

## 2022-05-08 ENCOUNTER — Telehealth: Payer: Self-pay | Admitting: Student in an Organized Health Care Education/Training Program

## 2022-05-08 NOTE — Telephone Encounter (Signed)
Spoke with patient.  He states that during the procedure, something shot down his leg and made his leg jump off the table and start shaking.  States ever since then he has had pain at night in the same area and can't sleep.  Notified patient that Dr Trenton Gammon was out of town but that I would run it by Dr Laban Emperor to see if there was anything that needed to be done.

## 2022-05-08 NOTE — Telephone Encounter (Signed)
Spoke with Dr Laban Emperor.  States that the pain can get worse after RFA and to take pain meds as prescribed.  Informed patient that nerves may be irritated up to 6 weeks but to call back for any worsening of symptoms, questions or concerns. Patient states understanding.

## 2022-05-08 NOTE — Telephone Encounter (Signed)
PT stated that he has been in pain lower back on his right side. PT stated that he is fine during the day but at night the pain worst. PT stated he isn't able to rest or sleep at night due to the pain. PT stated he wants to know is this normal ,if not what needs to be done. Please give patient a call. TY

## 2022-05-09 ENCOUNTER — Ambulatory Visit: Payer: Medicare Other

## 2022-05-09 DIAGNOSIS — M25511 Pain in right shoulder: Secondary | ICD-10-CM | POA: Diagnosis not present

## 2022-05-09 DIAGNOSIS — M6281 Muscle weakness (generalized): Secondary | ICD-10-CM | POA: Diagnosis not present

## 2022-05-09 DIAGNOSIS — G8929 Other chronic pain: Secondary | ICD-10-CM | POA: Diagnosis not present

## 2022-05-09 DIAGNOSIS — M25512 Pain in left shoulder: Secondary | ICD-10-CM | POA: Diagnosis not present

## 2022-05-09 NOTE — Therapy (Signed)
OUTPATIENT PHYSICAL THERAPY SHOULDER TREATMENT  Patient Name: [name] MRN: [id number] DOB:09/22/1955, 67 y.o., male Today's Date: 05/10/2022   PT End of Session - 05/09/22 1401     Visit Number 25    Number of Visits 49    Date for PT Re-Evaluation 06/20/22    Authorization Type [employer] Medicare    Authorization Time Period [employer] medicare 2024  ON:[place] on [place]    PT Start Time 1402    PT Stop Time 1430    PT Time Calculation (min) 28 min    Activity Tolerance Patient tolerated treatment well    Behavior During Therapy WFL for tasks assessed/performed            Past Medical History:  Diagnosis Date   Anxiety    Aortic atherosclerosis    Cardiomyopathy (in setting of Afib)    a.) TTE 12/26/2013: EF 45-50%, mild ant and antsept HK. mild MR. Mod dil LA. nl RV fxn. Rhythm was Afib; b.) TTE 07/04/2019: EF 55%, mid-apical anteroseptal HK, mild MAC, mild Ao sclerosis, G2DD, RVSP 45.3   Chronic pain syndrome    a.) followed by pain management   Chronic, continuous use of opioids    a.) hydrocodone/APAP 7.5/325 mg; followed by pain management   Coronary artery disease 11/06/2014   a.) cCTA 11/06/2014: Ca score 224 (all in pLAD) -- 74th percentile for age/sex matched control   Diverticulosis    DJD (degenerative joint disease) of knee    History of hiatal hernia    History of kidney stones 2012   Hyperlipidemia    Hyperplastic colon polyp    Hypertension    Internal hemorrhoids    Intervertebral disc disorder with radiculopathy of lumbosacral region    Long term current use FULL DOSE (325 mg) aspirin    Long term current use of amiodarone    Long term current use of antithrombotics/antiplatelets    a.) rivaroxaban   Lower extremity edema    Mixed hyperlipidemia    Myalgia due to statin    Osteoarthritis    PAF (paroxysmal atrial fibrillation)    a.) CHA2DS2VASc = 4 (age, HTN, CHF, vascular disease history);  b.) s/p DCCV 03/13/2014 (200 J x1); c.) s/p DCCV 05/15/2019 (150 J  x 1, 200 J x2); d.) rate/rhythm maintained on oral amiodarone + metoprolol succinate; chronically anticoagulated with rivaroxaban   Pernicious anemia    Prostate cancer    Pulmonary nodule, right    a. 10/2014 Cardiac CTA: 7mm RLL nodule; b. 04/2015 CT Chest: stable 7mm RLL nodule. No new nodules; 02/2017 CTA Chest: stable, benign, 7mm RLL pulm nodule.   Sleep difficulties    a.) takes melatonin + trazodone PRN   SVT (supraventricular tachycardia)    Tubular adenoma of colon    Past Surgical History:  Procedure Laterality Date   BICEPT TENODESIS Right 09/27/2021   Procedure: Right reverse shoulder arthroplasty, biceps tenodesis;  Surgeon: Signa Kell, MD;  Location: [facility];  Service: Orthopedics;  Laterality: Right;   CARDIOVERSION N/A 05/15/2019   Procedure: CARDIOVERSION;  Surgeon: Antonieta Iba, MD;  Location: [facility];  Service: Cardiovascular;  Laterality: N/A;   CARDIOVERSION N/A 03/13/2014   Procedure: CARDIOVERSION; Location: [facility]; Surgeon: [name], MD   CARDIOVERSION N/A 03/24/2022   Procedure: CARDIOVERSION;  Surgeon: Antonieta Iba, MD;  Location: [facility];  Service: Cardiovascular;  Laterality: N/A;   CARPAL TUNNEL RELEASE Right 12/23/2014   Procedure: CARPAL TUNNEL RELEASE;  Surgeon: Deeann Saint, MD;  Location: [facility];  Service: Orthopedics;  Laterality: Right;   CARPAL TUNNEL RELEASE Left 01/06/2015   Procedure: CARPAL TUNNEL RELEASE;  Surgeon: Deeann Saint, MD;  Location: [facility];  Service: Orthopedics;  Laterality: Left;   COLONOSCOPY WITH PROPOFOL N/A 10/08/2017   Procedure: COLONOSCOPY WITH PROPOFOL;  Surgeon: Toney Reil, MD;  Location: [facility];  Service: Gastroenterology;  Laterality: N/A;   GASTRIC BYPASS  01/17/2000   KNEE SURGERY Right    knee trauma x3   prostate seeding     REVERSE SHOULDER ARTHROPLASTY Right 09/27/2021   Procedure: Right reverse shoulder arthroplasty, biceps tenodesis;  Surgeon: Signa Kell, MD;  Location:  [facility];  Service: Orthopedics;  Laterality: Right;   SHOULDER ARTHROSCOPY WITH ROTATOR CUFF REPAIR AND OPEN BICEPS TENODESIS Left 12/27/2021   Procedure: Left shoulder arthroscopic cuff repair (supraspinatus and subscapularis) with Regeneten Patch application;  Surgeon: Signa Kell, MD;  Location: [facility];  Service: Orthopedics;  Laterality: Left;   Patient Active Problem List   Diagnosis Date Noted   Symptomatic anemia 04/26/2022   Chronic pain of both knees 03/30/2022   Bilateral primary osteoarthritis of knee 03/30/2022   Shortness of breath 03/24/2022   S/p reverse total shoulder arthroplasty 09/27/2021   Lesion of bone of lumbosacral spine (L5) 05/12/2021   Localized osteoarthritis of shoulder regions, bilateral 03/29/2021   Chronic pain of both shoulders 03/29/2021   Drug-induced myopathy 02/21/2021   Trigger finger of right hand 11/15/2020   Prostate cancer 07/26/2020   Insomnia 08/01/2019   Primary osteoarthritis of both wrists 02/17/2019   Trigger middle finger of left hand 02/17/2019   Chronic pain syndrome 02/17/2019   Chronic radicular lumbar pain 10/08/2018   Lumbar radiculopathy 10/08/2018   Lumbar degenerative disc disease 10/08/2018   Lumbar facet arthropathy 10/08/2018   Lumbar facet joint syndrome 10/08/2018   Intervertebral disc disorder with radiculopathy of lumbosacral region    Osteoarthritis of knee 11/30/2017   Osteoarthritis of wrist 11/30/2017   Pernicious anemia 05/22/2016   Persistent atrial fibrillation 12/20/2015   Carpal tunnel syndrome 11/16/2014   DJD (degenerative joint disease) of knee 10/27/2014   Bilateral carpal tunnel syndrome 10/27/2014   Morbid obesity with BMI of 50.0-59.9, adult 10/27/2014   Mixed hyperlipidemia 10/27/2014   H/O gastric bypass 03/20/2014   Hyperkalemia 03/20/2014   History of prostate cancer 12/26/2013   Essential hypertension 12/26/2013   Encounter for anticoagulation discussion and counseling 12/26/2013   PCP:  Smitty Cords, DO  REFERRING PROVIDER: Signa Kell, MD  REFERRING DIAGNOSIS: Incomplete tear of left rotator cuff, unspecified whether traumatic M75.112   THERAPY DIAG: Chronic left shoulder pain  Chronic right shoulder pain  Muscle weakness (generalized)  RATIONALE FOR EVALUATION AND TREATMENT: Rehabilitation  ONSET DATE: Surgery 12/27/21, Chronic L shoulder pain for multiple years  FOLLOW UP APPT WITH PROVIDER: Yes    FROM INITIAL EVALUATION SUBJECTIVE:  Chief Complaint: S/p L shoulder RTC repair, biceps tenodesis, and shoulder debridement  Pertinent History Pt with history of chronic bilateral shoulder pain. He underwent right reverse TSR and right biceps tenodesis on 09/27/21. He experienced post-operative instability in R shoulder however it improved and he was able to avoid a revision. On 12/27/21 he underwent left arthroscopic rotator cuff repair (subscapularis (full-thickness), supraspinatus (partial-thickness) side-to-side repair + Regeneten patch), left arthroscopic biceps tenodesis, and left extensive debridement of shoulder (glenohumeral and subacromial spaces). Per surgical note other intraoperative findings include Type 2 SLAP tear, degenerative anterior labral tearing, grade 2 degenerative changes to articular cartilage of humeral head, and significant posterior capsule synovitis. No significant post-operative complications. He arrives without abduction pillow and states that Dr. Allena Katz advised him he doesn't have to wear the abduction pillow. He got behind on his pain medication today and states that today his shoulder has been the most painful he has experienced since surgery. He also noticed swelling in L lateral upper arm this morning. He did take his pain medication at 14:00, before  PT evaluation.  He has a history of chronic back pain and underwent a lumbar facet, medial branch radiofrequency ablation on 01/02/22. At the time of the evaluation pt has not yet appreciated improvement in his back pain from this procedure. He has a past medical history of Cardiomyopathy (in setting of Afib), DJD (degenerative joint disease) of knee, History of kidney stones, Intervertebral disc disorder with radiculopathy of lumbosacral region, Kidney stone (2012), Lower extremity edema, PAF (paroxysmal atrial fibrillation) (HCC), Pernicious anemia, Prostate cancer (HCC), Pulmonary nodule, right, and SVT (supraventricular tachycardia) (HCC). He also  has a past surgical history that includes Knee surgery; Gastric bypass (2002); prostate seeding; Carpal tunnel release (Right, 12/23/2014); Carpal tunnel release (Left, 01/06/2015); Colonoscopy with propofol (N/A, 10/08/2017); and Cardioversion (N/A, 05/15/2019).   Pain:  Pain Intensity: Present: 5/10, Best: 3/10, Worst: 10/10 Pain location: Anterior L shoulder Pain Quality: aching and throbbing Radiating: No  Numbness/Tingling: No Focal Weakness: Yes History of prior shoulder or neck/shoulder injury, pain, surgery, or therapy: Yes, chronic bilateral shoulder pain with R TSR. 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 No AROM or WB through LUE (per protocol), Passive elevation and ER in staged ROM to begin at week 4 (elevation 90 degrees weeks 4-6 with no ER;  0-120 weeks 6-8 with ER 0-30 degrees). No pulleys or cane until after 6 weeks, recommend no driving x 6-8 weeks.   Weight Bearing Restrictions: Yes No WB through LUE for at least 6 weeks  Living Environment Lives with: lives with their spouse Lives in: House/apartment  Patient Goals: Improve L shoulder strength and pain. Pt would like to be able to play with/lift his grandson as well as take care of his property.   OBJECTIVE:   Patient Surveys  FOTO: To be completed QuickDASH: 47.7%  Cognition Patient is oriented to person, place, and time.  Recent memory is intact.  Remote memory is intact.  Attention span and concentration are intact.  Expressive speech is intact.  Patient's fund of knowledge is within normal limits for educational  level.    Gross Musculoskeletal Assessment Tremor: None Bulk: Mild swelling noted in L lateral upper arm. Pt denies chills, fevers, redness, or dischrage from inicisions Tone: Normal  Posture Forward head and rounded shoulders  AROM Full L wrist AROM flexion, extension, radial deviation, and ulnar deviation. MCP flexion and extension WNL. Unable to assess L shoulder and elbow AROM per protocol  PROM Full L elbow PROM flexion and extension however during first attempt at slow passive extension pt reports sudden pain in L shoulder which eventually improve after a couple minutes of rest. Full L elbow PROM pronation/supination.   LE MMT: Full L wrist strength for flexion, extension, radial deviation, and ulnar deviation. L grip strength is intact and strong. Deferred testing of L elbow and shoulder strength per protocol  Sensation Grossly intact to light touch throughout LUE as determined by testing dermatomes C2-T2. Proprioception and hot/cold testing deferred on this date.  Palpation Pt is generally tender to light palpation around anterior, lateral, and posterior L shoulder;    TODAY'S TREATMENT    SUBJECTIVE: Pt reports his shoulders  are doing alright today. He had his second iron infusion and has noticed an slight improvement in his fatigue. His back continues to do well. Pt saw an electrocardiologist who wants to do another cardioversion.   PAIN: No resting shoulder pain reported upon arrival   Ther-ex  All exercises performed sitting fully upright today unless otherwise specified: R shoulder flexion from neutral to 130 degrees with 4# dumbbell (DB) 2 x 10 R shoulder scaption from neutral to around 90 degrees with 4# DB 2 x 10; Overhead R shoulder press, 4# DB 2 x 10; R shoulder internal rotation from forearm vertical to stomach with manual resistance from therapist 2 x 10; R shoulder external rotation from stomach to forearm vertical with manual resistance from therapist 2 x 10; Seated chest press with cane and manual resistance from therapist 2 x 10; Seated rows with cane and manual resistance from therapist 2 x 10; L shoulder AROM flexion with straight elbow from neutral to around 130 degrees with 2# DB 2 x 8; L shoulder AROM scaption to around 90 degrees with 2# DB, regressed to 1# DB 2 x 8, notable weakness in L shoulder abduction;   Not performed: Seated shoulder extension from 60 flexion to neutral with green tband 2 x 10 BUE; Seated shoulder rows never moving elbows past mid axilla, green tband 2 x 10 BUE; L elbow AROM flexion with light manual resistance 2 x 15; L elbow manually resisted extension 2 x 15; L shoulder IR from vertical to stomach with light manual resistance 2 x 10, no pain reported; L shoulder ER from stomach to vertical with light manual resistance 2 x 10, no pain reported; L shoulder manually resisted isometric extension 3s hold x 10;   PATIENT EDUCATION:  Education details:  Pt educated throughout session about proper posture and technique with exercises. Improved exercise technique, movement at target joints, use of target muscles after min to mod verbal, visual, tactile cues. Person  educated: Patient Education method: Explanation, verbal cues Education comprehension: verbalized understanding and returned demonstration   HOME EXERCISE PROGRAM: Access Code: [email or web address] URL: [email or web address] Date: 03/28/2022 Prepared by: Ria Comment  Exercises - Wrist Flexion AROM  - 1 x daily - 7 x weekly - 2 sets - 10 reps - 3s hold - Wrist Extension AROM  - 1 x daily - 7 x weekly - 2 sets - 10 reps - 3s  hold - Seated Scapular Retraction  - 1 x daily - 7 x weekly - 2 sets - 10 reps - 3s hold - Standing Elbow Flexion Extension AROM  - 1 x daily - 7 x weekly - 2 sets - 10 reps - 3s hold - Standing Shoulder Flexion to 90 Degrees  - 1 x daily - 7 x weekly - 2 sets - 10 reps - 3s hold - Shoulder Abduction - Thumbs Up  - 1 x daily - 7 x weekly - 2 sets - 10 reps - 3s hold   ASSESSMENT:  CLINICAL IMPRESSION: Pt requests to end session early due to increase in L shoulder pain with strengthening. R shoulder continues to progress well however considerable weakness noted in L shoulder abduction. Pt encouraged to continue HEP and follow-up as scheduled. Encouraged pt to mention L shoulder abduction weakness to orthopedic surgeon at follow-up. Plan to progress LUE per protocol as pt is able to tolerate in addition to progressing resistance with RUE strengthening against gravity. He will benefit from skilled PT to address deficits in range of motion and strength in order to improve overall function at home and with leisure activities such as caring for his property.  REHAB POTENTIAL: Good  CLINICAL DECISION MAKING: Evolving/moderate complexity  EVALUATION COMPLEXITY: Moderate   GOALS: Goals reviewed with patient? Yes  SHORT TERM GOALS: Target date: 05/09/2022   Pt will be independent with HEP to improve strength and decrease right shoulder pain to improve pain-free function at home and work. Baseline:  Goal status: ONGOING   LONG TERM GOALS: Target date: 06/20/22;  Pt  will increase L shoulder FOTO to at least 51 to demonstrate significant improvement in function at home and with leisure activities related to L shoulder. Baseline: 01/03/22: To be completed; 01/24/22: 4; 02/28/22: 59; 04/11/22: 55; Goal status: ACHIEVED  2.  Pt will decrease worst L shoulder pain by at least 3 points on the NPRS in order to demonstrate clinically significant reduction in R shoulder pain. Baseline: 01/03/22: worst: 10/10; 02/28/22: 10/10; 04/11/22: 4/10; Goal status: ACHIEVED  3.  Pt will decrease quick DASH score by at least 8% in order to demonstrate clinically significant reduction in disability related to L shoulder pain        Baseline: 01/03/22: 47.7%; 02/28/22: 65.91%; 04/11/22: 56.8%; Goal status: ONGOING  4. Pt will demonstrate L shoulder flexion, abduction, IR, and ER strength of at least 4/5 in order to demonstrate improvement in strength and function       Baseline: 01/03/22: Unable to test per protocol; 02/28/22: At least 3/5 but unable to fully test due to protocol; 04/11/22: flexion: 4/5, abduction: 4-/5 (IR and ER at least 3/5 but not tested); Goal status: PARTIALLY MET  PLAN: PT FREQUENCY: 1-2x/week  PT DURATION: 12 weeks  PLANNED INTERVENTIONS: Therapeutic exercises, Therapeutic activity, Neuromuscular re-education, Balance training, Gait training, Patient/Family education, Joint manipulation, Joint mobilization, Vestibular training, Canalith repositioning, Aquatic Therapy, Dry Needling, Electrical stimulation, Spinal manipulation, Spinal mobilization, Cryotherapy, Moist heat, Traction, Ultrasound, Ionotophoresis /ml Dexamethasone, and Manual therapy  PLAN FOR NEXT SESSION: Progress L shoulder ROM and strength per protocol, continue R shoulder strengthening   Sharalyn Ink Tiras Bianchini PT, DPT, GCS  Physical Therapist-   [facility]

## 2022-05-11 ENCOUNTER — Ambulatory Visit: Payer: Medicare Other

## 2022-05-12 ENCOUNTER — Inpatient Hospital Stay: Payer: Medicare Other

## 2022-05-12 VITALS — BP 121/85 | HR 75 | Temp 97.9°F | Resp 16

## 2022-05-12 DIAGNOSIS — I2510 Atherosclerotic heart disease of native coronary artery without angina pectoris: Secondary | ICD-10-CM | POA: Diagnosis not present

## 2022-05-12 DIAGNOSIS — R5383 Other fatigue: Secondary | ICD-10-CM | POA: Diagnosis not present

## 2022-05-12 DIAGNOSIS — D649 Anemia, unspecified: Secondary | ICD-10-CM

## 2022-05-12 DIAGNOSIS — I4891 Unspecified atrial fibrillation: Secondary | ICD-10-CM | POA: Diagnosis not present

## 2022-05-12 DIAGNOSIS — K649 Unspecified hemorrhoids: Secondary | ICD-10-CM | POA: Diagnosis not present

## 2022-05-12 DIAGNOSIS — Z7901 Long term (current) use of anticoagulants: Secondary | ICD-10-CM | POA: Diagnosis not present

## 2022-05-12 MED ORDER — SODIUM CHLORIDE 0.9 % IV SOLN
200.0000 mg | Freq: Once | INTRAVENOUS | Status: AC
  Administered 2022-05-12: 200 mg via INTRAVENOUS
  Filled 2022-05-12: qty 200

## 2022-05-12 MED ORDER — SODIUM CHLORIDE 0.9 % IV SOLN
Freq: Once | INTRAVENOUS | Status: AC
  Filled 2022-05-12: qty 250

## 2022-05-12 MED ORDER — CYANOCOBALAMIN 1000 MCG/ML IJ SOLN: 1000.0000 ug | Freq: Once | INTRAMUSCULAR | Status: DC

## 2022-05-12 NOTE — Patient Instructions (Signed)

## 2022-05-12 NOTE — Progress Notes (Signed)
Pt tolerated treatment well.  VSS, no complaints.  Pt refused 30 minute post observation.

## 2022-05-15 ENCOUNTER — Other Ambulatory Visit
Admission: RE | Admit: 2022-05-15 | Discharge: 2022-05-15 | Disposition: A | Discharge status: Home / Self Care | Payer: Medicare Other | Attending: Internal Medicine | Admitting: Internal Medicine

## 2022-05-15 ENCOUNTER — Telehealth: Payer: Self-pay

## 2022-05-15 ENCOUNTER — Encounter: Payer: Self-pay | Admitting: Gastroenterology

## 2022-05-15 ENCOUNTER — Ambulatory Visit: Payer: Medicare Other | Admitting: Gastroenterology

## 2022-05-15 ENCOUNTER — Other Ambulatory Visit: Payer: Self-pay

## 2022-05-15 VITALS — BP 126/83 | HR 83 | Temp 98.1°F | Ht 72.0 in | Wt 335.4 lb

## 2022-05-15 DIAGNOSIS — Z01812 Encounter for preprocedural laboratory examination: Secondary | ICD-10-CM

## 2022-05-15 DIAGNOSIS — R933 Abnormal findings on diagnostic imaging of other parts of digestive tract: Secondary | ICD-10-CM

## 2022-05-15 DIAGNOSIS — I4819 Other persistent atrial fibrillation: Secondary | ICD-10-CM | POA: Diagnosis not present

## 2022-05-15 DIAGNOSIS — Z86010 Personal history of colonic polyps: Secondary | ICD-10-CM

## 2022-05-15 DIAGNOSIS — K625 Hemorrhage of anus and rectum: Secondary | ICD-10-CM

## 2022-05-15 DIAGNOSIS — D500 Iron deficiency anemia secondary to blood loss (chronic): Secondary | ICD-10-CM

## 2022-05-15 LAB — CBC
HCT: 39.1 % (ref 39.0–52.0)
Hemoglobin: 11.4 g/dL — ABNORMAL LOW (ref 13.0–17.0)
MCH: 21.3 pg — ABNORMAL LOW (ref 26.0–34.0)
MCHC: 29.2 g/dL — ABNORMAL LOW (ref 30.0–36.0)
MCV: 73.1 fL — ABNORMAL LOW (ref 80.0–100.0)
Platelets: 284 10*3/uL (ref 150–400)
RBC: 5.35 MIL/uL (ref 4.22–5.81)
RDW: 22.2 % — ABNORMAL HIGH (ref 11.5–15.5)
WBC: 8.6 10*3/uL (ref 4.0–10.5)
nRBC: 0 % (ref 0.0–0.2)

## 2022-05-15 LAB — BASIC METABOLIC PANEL
Anion gap: 8 (ref 5–15)
BUN: 16 mg/dL (ref 8–23)
CO2: 25 mmol/L (ref 22–32)
Calcium: 8.5 mg/dL — ABNORMAL LOW (ref 8.9–10.3)
Chloride: 107 mmol/L (ref 98–111)
Creatinine, Ser: 1.07 mg/dL (ref 0.61–1.24)
GFR, Estimated: >60 mL/min (ref >60)
Glucose, Bld: 112 mg/dL — ABNORMAL HIGH (ref 70–99)
Potassium: 4.4 mmol/L (ref 3.5–5.1)
Sodium: 140 mmol/L (ref 135–145)

## 2022-05-15 MED ORDER — NA SULFATE-K SULFATE-MG SULF 17.5-3.13-1.6 GM/177ML PO SOLN: 354.0000 mL | Freq: Once | ORAL | 0 refills | Status: AC

## 2022-05-15 NOTE — Telephone Encounter (Signed)
Per cardiology stop the Xarelto 2 days before procedure. Called and informed patient and patient verbalized understanding of instructions

## 2022-05-15 NOTE — Progress Notes (Signed)
Arlyss Repress, MD [address]  [address]  [place], [place] [address]  Main: [phone]  Fax: [phone]    Gastroenterology Consultation  Referring Provider:     Saralyn Pilar * Primary Care Physician:  Smitty Cords, DO Primary Gastroenterologist:  Dr. Arlyss Repress Reason for Consultation: Iron deficiency anemia, small bowel lesion on imaging, rectal bleeding        HPI:   [name] is a 67 y.o. male referred by Dr. Althea Charon, Netta Neat, DO  for consultation & management of chronic iron deficiency anemia, chronic intermittent rectal bleeding and small bowel lesion on CT scan.  Patient underwent CT abdomen pelvis without contrast on 02/21/2022 for groin pain and was incidentally found to have possible mucosal lesion in the distal small bowel anastomosis s/p gastric bypass.  61-month repeat abdominal CT scan with oral contrast was recommended.  Patient was referred to GI for further evaluation.  He had diverticulosis and cholelithiasis.  Patient underwent Roux-en-Y gastric bypass in 2007.  He has been feeling tired, generalized weakness and fatigue, found to be anemic in 09/2021.  He received 3 iron infusions so far and currently on oral iron supplements, his hemoglobin improved from 10-10.7 and patient states that he notices improvement in his energy levels and able to be more active and alert.  He does report painless episodes of rectal bleeding that happen 2 to 3 days on a weekly basis.  He denies any constipation or diarrhea.  Patient is scheduled to undergo cardioversion on Friday this week by Dr. Mariah Milling on 05/19/2022  NSAIDs: None  Antiplts/Anticoagulants/Anti thrombotics: Xarelto for history of A-fib  GI Procedures: Current to be 2019 for rectal bleeding Several subcentimeter polyps were removed  DIAGNOSIS:  A. COLON POLYP X 2, CECUM; COLD BIOPSY:  - TUBULAR ADENOMAS, 2 FRAGMENTS.  - NEGATIVE FOR HIGH-GRADE DYSPLASIA AND MALIGNANCY.   B.   COLON POLYP, ASCENDING; COLD SNARE:  - TUBULAR ADENOMA.  - NEGATIVE FOR HIGH-GRADE DYSPLASIA AND MALIGNANCY.   C.  COLON POLYP, TRANSVERSE; COLD BIOPSY:  - CRYPT HYPERPLASIA AND LYMPHOID AGGREGATE.  - NEGATIVE FOR DYSPLASIA AND MALIGNANCY.   D.  RECTUM POLYP; COLD BIOPSY:  - HYPERPLASTIC POLYP.  - NEGATIVE FOR DYSPLASIA AND MALIGNANCY.   Past Medical History:  Diagnosis Date   Anxiety    Aortic atherosclerosis (HCC)    Cardiomyopathy (in setting of Afib)    a.) TTE 12/26/2013: EF 45-50%, mild ant and antsept HK. mild MR. Mod dil LA. nl RV fxn. Rhythm was Afib; b.) TTE 07/04/2019: EF 55%, mid-apical anteroseptal HK, mild MAC, mild Ao sclerosis, G2DD, RVSP 45.3   Chronic pain syndrome    a.) followed by pain management   Chronic, continuous use of opioids    a.) hydrocodone/APAP 7.5/325 mg; followed by pain management   Coronary artery disease 11/06/2014   a.) cCTA 11/06/2014: Ca score 224 (all in pLAD) -- 74th percentile for age/sex matched control   Diverticulosis    DJD (degenerative joint disease) of knee    History of hiatal hernia    History of kidney stones 2012   Hyperlipidemia    Hyperplastic colon polyp    Hypertension    Internal hemorrhoids    Intervertebral disc disorder with radiculopathy of lumbosacral region    Long term current use FULL DOSE (325 mg) aspirin    Long term current use of amiodarone    Long term current use of antithrombotics/antiplatelets    a.) rivaroxaban  Lower extremity edema    Mixed hyperlipidemia    Myalgia due to statin    Osteoarthritis    PAF (paroxysmal atrial fibrillation) (HCC)    a.) CHA2DS2VASc = 4 (age, HTN, CHF, vascular disease history);  b.) s/p DCCV 03/13/2014 (200 J x1); c.) s/p DCCV 05/15/2019 (150 J x 1, 200 J x2); d.) rate/rhythm maintained on oral amiodarone + metoprolol succinate; chronically anticoagulated with rivaroxaban   Pernicious anemia    Prostate cancer (HCC)    Pulmonary nodule, right    a. 10/2014  Cardiac CTA: 7mm RLL nodule; b. 04/2015 CT Chest: stable 7mm RLL nodule. No new nodules; 02/2017 CTA Chest: stable, benign, 7mm RLL pulm nodule.   Sleep difficulties    a.) takes melatonin + trazodone PRN   SVT (supraventricular tachycardia)    Tubular adenoma of colon     Past Surgical History:  Procedure Laterality Date   BICEPT TENODESIS Right 09/27/2021   Procedure: Right reverse shoulder arthroplasty, biceps tenodesis;  Surgeon: Signa Kell, MD;  Location: [facility];  Service: Orthopedics;  Laterality: Right;   CARDIOVERSION N/A 05/15/2019   Procedure: CARDIOVERSION;  Surgeon: Antonieta Iba, MD;  Location: [facility];  Service: Cardiovascular;  Laterality: N/A;   CARDIOVERSION N/A 03/13/2014   Procedure: CARDIOVERSION; Location: [facility]; Surgeon: [name], MD   CARDIOVERSION N/A 03/24/2022   Procedure: CARDIOVERSION;  Surgeon: Antonieta Iba, MD;  Location: [facility];  Service: Cardiovascular;  Laterality: N/A;   CARPAL TUNNEL RELEASE Right 12/23/2014   Procedure: CARPAL TUNNEL RELEASE;  Surgeon: Deeann Saint, MD;  Location: [facility];  Service: Orthopedics;  Laterality: Right;   CARPAL TUNNEL RELEASE Left 01/06/2015   Procedure: CARPAL TUNNEL RELEASE;  Surgeon: Deeann Saint, MD;  Location: [facility];  Service: Orthopedics;  Laterality: Left;   COLONOSCOPY WITH PROPOFOL N/A 10/08/2017   Procedure: COLONOSCOPY WITH PROPOFOL;  Surgeon: Toney Reil, MD;  Location: [facility];  Service: Gastroenterology;  Laterality: N/A;   GASTRIC BYPASS  01/17/2000   KNEE SURGERY Right    knee trauma x3   prostate seeding     REVERSE SHOULDER ARTHROPLASTY Right 09/27/2021   Procedure: Right reverse shoulder arthroplasty, biceps tenodesis;  Surgeon: Signa Kell, MD;  Location: [facility];  Service: Orthopedics;  Laterality: Right;   SHOULDER ARTHROSCOPY WITH ROTATOR CUFF REPAIR AND OPEN BICEPS TENODESIS Left 12/27/2021   Procedure: Left shoulder arthroscopic cuff repair (supraspinatus  and subscapularis) with Regeneten Patch application;  Surgeon: Signa Kell, MD;  Location: [facility];  Service: Orthopedics;  Laterality: Left;     Current Outpatient Medications:    amiodarone (PACERONE) 200 MG tablet, Take 2 tablets (400 mg) by mouth twice a day x 2 weeks, Disp: 56 tablet, Rfl: 0   benazepril (LOTENSIN) 40 MG tablet, TAKE ONE TABLET BY MOUTH ONE TIME DAILY (Patient taking differently: Take 40 mg by mouth every morning. TAKE ONE TABLET BY MOUTH ONE TIME DAILY), Disp: 90 tablet, Rfl: 3   busPIRone (BUSPAR) 10 MG tablet, TAKE ONE TABLET BY MOUTH FOUR TIMES A DAY AS NEEDED FOR ANXIETY, Disp: 360 tablet, Rfl: 1   Calcium-Magnesium-Vitamin D (OPURITY CALCIUM CITRATE PLUS) 300-20-200 MG-MG-UNIT CHEW, Chew 2 tablets by mouth daily. Silver leaf brand 300 Mg supplement, Disp: , Rfl:    carboxymethylcellulose (REFRESH PLUS) 0.5 % SOLN, 1 drop 3 (three) times daily as needed., Disp: , Rfl:    CHIA SEED PO, Take 2 capsules by mouth daily. 750 mg supplement  2 by mouth in the morning., Disp: ,  Rfl:    cyanocobalamin (VITAMIN B12) 1000 MCG/ML injection, Inject 1 mL (1,000 mcg total) into the skin every 30 (thirty) days. INJECT EVERY 30 DAYS AS DIRECTED, Disp: 1 mL, Rfl: 99   cyclobenzaprine (FLEXERIL) 10 MG tablet, TAKE ONE TABLET BY MOUTH THREE TIMES A DAY AS NEEDED FOR MUSCLE SPASM, Disp: 90 tablet, Rfl: 3   DULoxetine (CYMBALTA) 30 MG capsule, TAKE ONE CAPSULE BY MOUTH ONE TIME DAILY, Disp: 90 capsule, Rfl: 1   ferrous sulfate 325 (65 FE) MG EC tablet, Take 325 mg by mouth 3 (three) times daily with meals., Disp: , Rfl:    [START ON 05/21/2022] HYDROcodone-acetaminophen (NORCO) 7.5-325 MG tablet, Take 1 tablet by mouth every 6 (six) hours as needed for severe pain. Must last 30 days, Disp: 120 tablet, Rfl: 0   [START ON 06/20/2022] HYDROcodone-acetaminophen (NORCO) 7.5-325 MG tablet, Take 1 tablet by mouth every 6 (six) hours as needed for severe pain. Must last 30 days, Disp: 120 tablet, Rfl:  0   [START ON 07/20/2022] HYDROcodone-acetaminophen (NORCO) 7.5-325 MG tablet, Take 1 tablet by mouth every 6 (six) hours as needed for severe pain. Must last 30 days, Disp: 120 tablet, Rfl: 0   melatonin 3 MG TABS tablet, Take 3 mg by mouth at bedtime., Disp: , Rfl:    metoprolol succinate (TOPROL-XL) 50 MG 24 hr tablet, TAKE ONE TABLET BY MOUTH ONE TIME DAILY WITH OR IMMEDIATELY FOLLOWING A MEAL, Disp: 90 tablet, Rfl: 2   Multiple Vitamin (MULTIVITAMIN WITH MINERALS) TABS tablet, Take 1 tablet by mouth daily., Disp: , Rfl:    rivaroxaban (XARELTO) 20 MG TABS tablet, TAKE ONE TABLET BY MOUTH ONE TIME DAILY WITH SUPPER, Disp: 90 tablet, Rfl: 1   traZODone (DESYREL) 150 MG tablet, TAKE ONE TABLET BY MOUTH AT BEDTIME, Disp: 90 tablet, Rfl: 1   Vitamin D, Ergocalciferol, (DRISDOL) 1.25 MG (50000 UNIT) CAPS capsule, Take 50,000 Units by mouth once a week., Disp: , Rfl:    Family History  Problem Relation Age of Onset   Heart disease Father    Hypertension Father    Stroke Father    Cancer Father        prostate cancer   Cancer Sister    Arrhythmia Sister        A-fib   Arrhythmia Brother        A-fib   Cancer Brother      Social History   Tobacco Use   Smoking status: Former    Packs/day: 1.00    Years: 10.00    Additional pack years: 0.00    Total pack years: 10.00    Types: Cigarettes    Quit date: 02/18/1979    Years since quitting: 43.2   Smokeless tobacco: Former    Types: Snuff  Vaping Use   Vaping Use: Never used  Substance Use Topics   Alcohol use: Not Currently    Comment: last drink in 2021   Drug use: No    Allergies as of 05/15/2022 - Review Complete 05/15/2022  Allergen Reaction Noted   Bupivacaine liposome Palpitations 02/07/2022    Review of Systems:    All systems reviewed and negative except where noted in HPI.   Physical Exam:  BP 126/83 (BP Location: Left Arm, Patient Position: Sitting, Cuff Size: Normal)   Pulse 83   Temp 98.1 F (36.7 C) (Oral)    Ht 6' (1.829 m)   Wt (!) 335 lb 6 oz (152.1 kg)   BMI 45.49  kg/m  No LMP for male patient.  General:   Alert,  Well-developed, well-nourished, pleasant and cooperative in NAD Head:  Normocephalic and atraumatic. Eyes:  Sclera clear, no icterus.   Conjunctiva pink. Ears:  Normal auditory acuity. Nose:  No deformity, discharge, or lesions. Mouth:  No deformity or lesions,oropharynx pink & moist. Neck:  Supple; no masses or thyromegaly. Lungs:  Respirations even and unlabored.  Clear throughout to auscultation.   No wheezes, crackles, or rhonchi. No acute distress. Heart:  Regular rate and rhythm; no murmurs, clicks, rubs, or gallops. Abdomen:  Normal bowel sounds. Soft, non-tender and non-distended without masses, hepatosplenomegaly or hernias noted.  No guarding or rebound tenderness.   Rectal: Not performed Msk:  Symmetrical without gross deformities. Good, equal movement & strength bilaterally. Pulses:  Normal pulses noted. Extremities:  No clubbing or edema.  No cyanosis. Neurologic:  Alert and oriented x3;  grossly normal neurologically. Skin:  Intact without significant lesions or rashes. No jaundice. Psych:  Alert and cooperative. Normal mood and affect.  Imaging Studies: Reviewed  Assessment and Plan:   [name] is a 67 y.o. male with obesity, A-fib on Xarelto, hypertension, history of Roux-en-Y gastric bypass in 2007, chronic iron deficiency anemia, painless rectal bleeding and CT abdomen and pelvis without contrast revealed possible small bowel lesion at the distal anastomosis of gastric bypass  Recommend EGD +/- Push enteroscopy and colonoscopy Follow-up with oncology for IV iron as needed and continue oral iron With regards to rectal bleeding, he was found to have internal hemorrhoids based on colonoscopy in 2019.  Discussed about outpatient hemorrhoid ligation for which Xarelto needs to be interrupted prior to and after each banding.  Discussed with him about  vascular embolization as an alternative approach which is 80% effective to control rectal bleeding.  For this, patient need to be referred to [facility]  Schedule procedures after cardioversion and obtaining cardiac clearance   Follow up based on the above workup   Arlyss Repress, MD

## 2022-05-15 NOTE — Telephone Encounter (Signed)
Pharmacy please advise on holding Xarelto prior to colonoscopy scheduled for 06/07/2022. Thank you.

## 2022-05-15 NOTE — Telephone Encounter (Signed)
   Patient Name: [name]  DOB: 1955/06/29 MRN: [id number]  Primary Cardiologist: [name], MD  Clinical pharmacists have reviewed the patient's past medical history, labs, and current medications as part of preoperative protocol coverage. The following recommendations have been made:   Per office protocol, patient can hold Xarelto for 1-2 days prior to procedure.   I will route this recommendation to the requesting party via Epic fax function and remove from pre-op pool.  Please call with questions.  Napoleon Form, Leodis Rains, NP 05/15/2022, 4:03 PM

## 2022-05-15 NOTE — Telephone Encounter (Signed)
Patient with diagnosis of afib on Xarelto for anticoagulation.    Procedure: colonoscopy and EGD Date of procedure: 06/07/22  CHA2DS2-VASc Score = 4  This indicates a 4.8% annual risk of stroke. The patient's score is based upon: CHF History: 1 HTN History: 1 Diabetes History: 0 Stroke History: 0 Vascular Disease History: 1 Age Score: 1 Gender Score: 0   Underwent DCCV 03/24/22.  CrCl 170mL/min using adj body weight Platelet count 284K  Per office protocol, patient can hold Xarelto for 1-2 days prior to procedure.    **This guidance is not considered finalized until pre-operative APP has relayed final recommendations.**

## 2022-05-15 NOTE — Addendum Note (Signed)
Addended by: Radene Knee L on: 05/15/2022 02:16 PM   Modules accepted: Orders

## 2022-05-15 NOTE — Telephone Encounter (Signed)
   [facility] Pre-operative Risk Assessment    Request for surgical clearance:  What type of surgery is being performed? Colonoscopy and EGD     When is this surgery scheduled? 06/07/2022  Are there any medications that need to be held prior to surgery and how long? Xarelto 20mg   Practice name and name of physician performing surgery?  Gastroenterology Dr. Allegra Lai   What is your office phone and fax number? [phone]   Anesthesia type (None, local, MAC, general) ? General    [name] 05/15/2022, 2:25 PM  _________________________________________________________________   (provider comments below)

## 2022-05-16 ENCOUNTER — Ambulatory Visit: Payer: Medicare Other

## 2022-05-17 ENCOUNTER — Inpatient Hospital Stay: Payer: Medicare Other | Attending: Internal Medicine

## 2022-05-17 VITALS — BP 119/82 | HR 72 | Temp 98.1°F

## 2022-05-17 DIAGNOSIS — I4891 Unspecified atrial fibrillation: Secondary | ICD-10-CM | POA: Insufficient documentation

## 2022-05-17 DIAGNOSIS — Z7901 Long term (current) use of anticoagulants: Secondary | ICD-10-CM | POA: Insufficient documentation

## 2022-05-17 DIAGNOSIS — I2510 Atherosclerotic heart disease of native coronary artery without angina pectoris: Secondary | ICD-10-CM | POA: Insufficient documentation

## 2022-05-17 DIAGNOSIS — K649 Unspecified hemorrhoids: Secondary | ICD-10-CM | POA: Insufficient documentation

## 2022-05-17 DIAGNOSIS — D649 Anemia, unspecified: Secondary | ICD-10-CM | POA: Insufficient documentation

## 2022-05-17 DIAGNOSIS — R5383 Other fatigue: Secondary | ICD-10-CM | POA: Insufficient documentation

## 2022-05-17 MED ORDER — SODIUM CHLORIDE 0.9 % IV SOLN
200.0000 mg | Freq: Once | INTRAVENOUS | Status: AC
  Administered 2022-05-17: 200 mg via INTRAVENOUS
  Filled 2022-05-17: qty 200

## 2022-05-17 MED ORDER — SODIUM CHLORIDE 0.9 % IV SOLN
Freq: Once | INTRAVENOUS | Status: AC
  Filled 2022-05-17: qty 250

## 2022-05-17 NOTE — Patient Instructions (Signed)

## 2022-05-18 ENCOUNTER — Ambulatory Visit: Payer: Medicare Other

## 2022-05-18 ENCOUNTER — Encounter: Payer: Self-pay | Admitting: Internal Medicine

## 2022-05-18 ENCOUNTER — Other Ambulatory Visit (HOSPITAL_COMMUNITY): Payer: Self-pay

## 2022-05-18 IMAGING — MR MR LUMBAR SPINE WO/W CM
6 of 7 series · 31 of 48 positions shown · IV contrast (gadavist)
Comparison: CT abdomen 03/13/2017

CLINICAL DATA: Low back pain for over 6 weeks.

EXAM:
MRI LUMBAR SPINE WITHOUT AND WITH CONTRAST
TECHNIQUE: Multiplanar and multiecho pulse sequences of the lumbar spine were
obtained without and with intravenous contrast.
CONTRAST:  10mL GADAVIST GADOBUTROL 1 MMOL/ML IV SOLN

[Series 5: T2 · sagittal · 4.0mm · 0.81mm/px · 5 of 17 slices shown (1 of 2)]
[im 1/17]
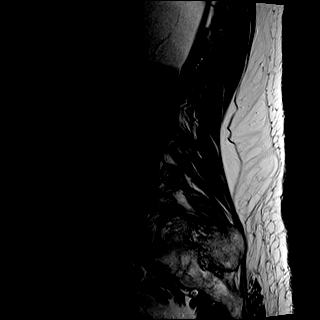
[im 5/17]
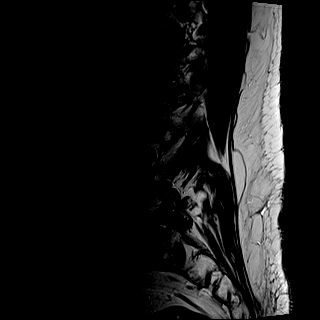
[im 9/17]
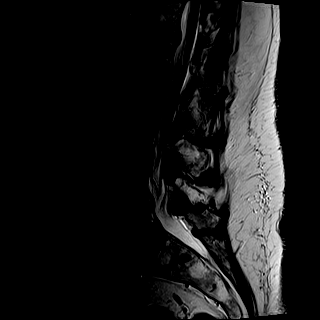
[im 13/17]
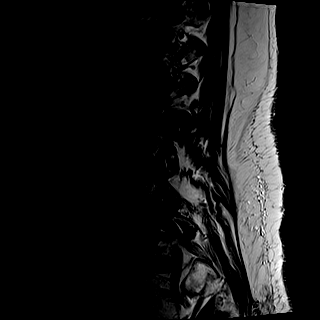
[im 17/17]
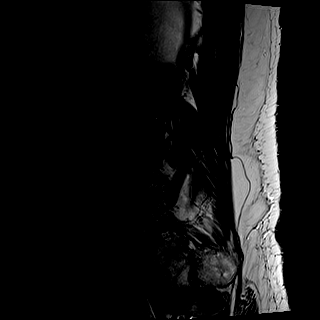

[Series 6: T1 · sagittal · 4.0mm · 0.81mm/px · 5 of 17 slices shown (1 of 2)]
[im 1/17]
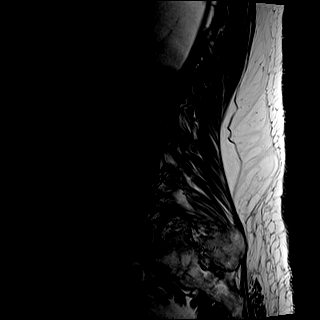
[im 5/17]
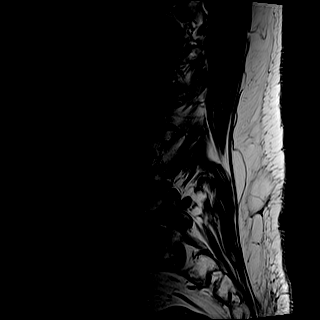
[im 9/17]
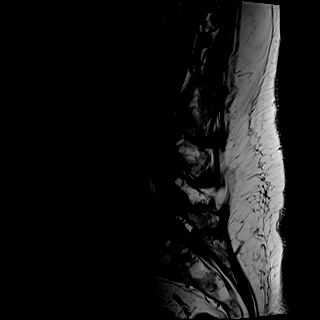
[im 13/17]
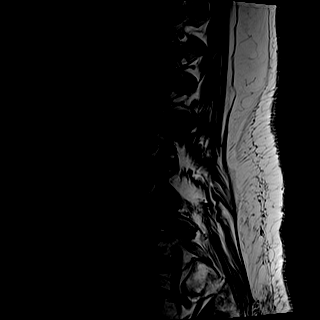
[im 17/17]
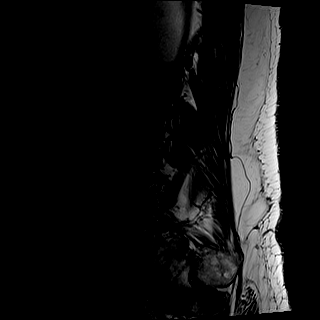

[Series 7: STIR · sagittal · 4.0mm · 0.41mm/px · 1 of 17 slices shown]
[im 1/17]
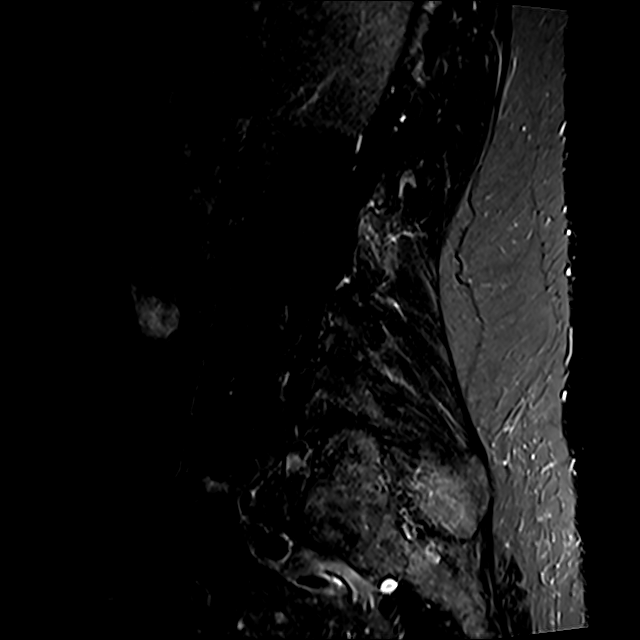

[Series 8: T2 · axial · 4.0mm · 0.78mm/px · z∈[-34,+205]mm · 8 of 40 slices shown (2 of 2)]
[im 1/40]
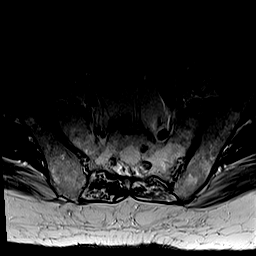
[im 5/40]
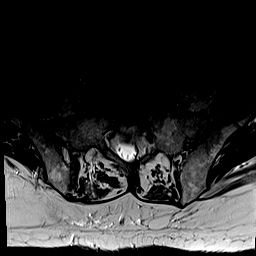
[im 14/40]
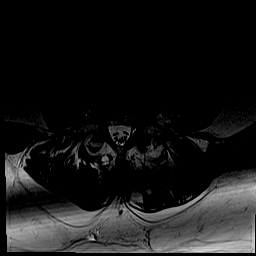
[im 18/40]
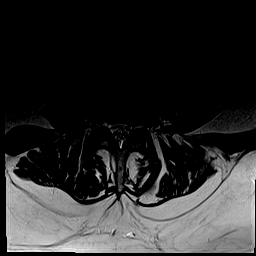
[im 22/40]
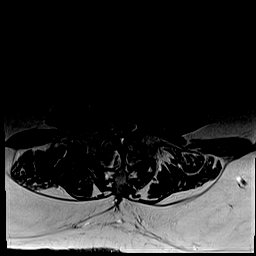
[im 27/40]
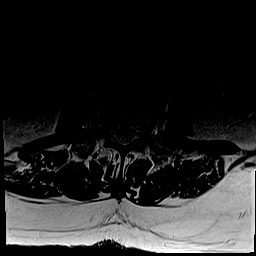
[im 35/40]
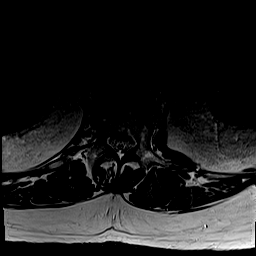
[im 40/40]
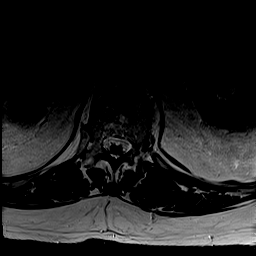

[Series 9: T1 · axial · 4.0mm · 0.39mm/px · z∈[-34,+205]mm · 8 of 40 slices shown (2 of 2)]
[im 1/40]
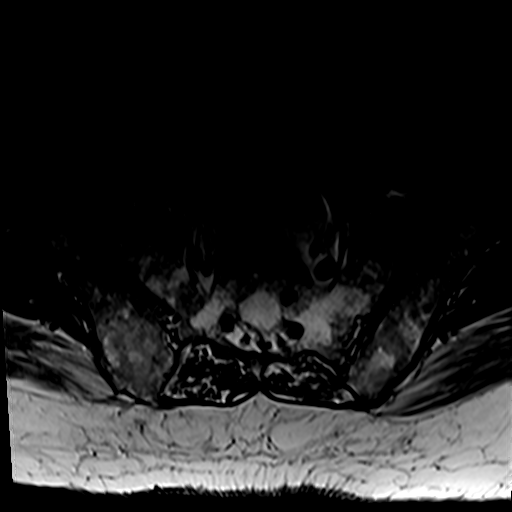
[im 5/40]
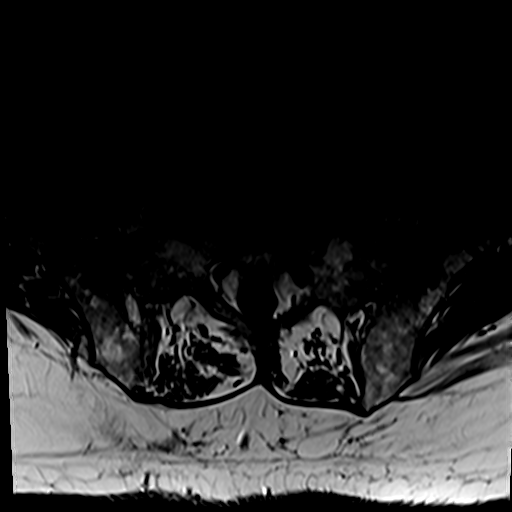
[im 14/40]
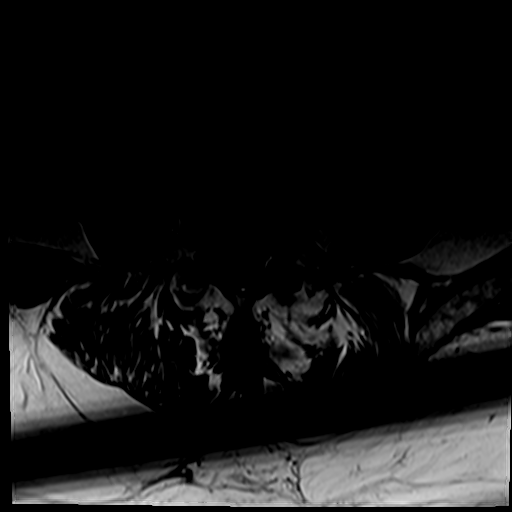
[im 18/40]
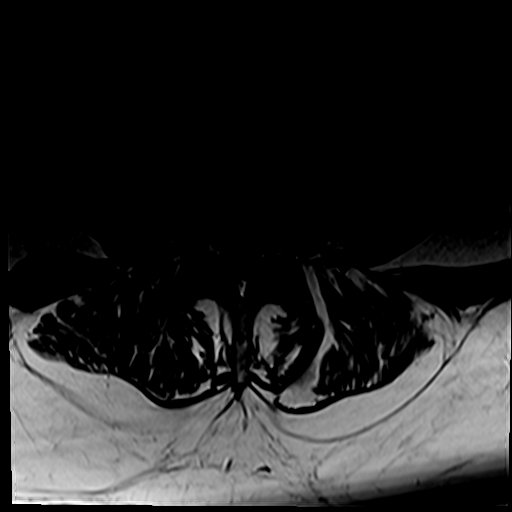
[im 22/40]
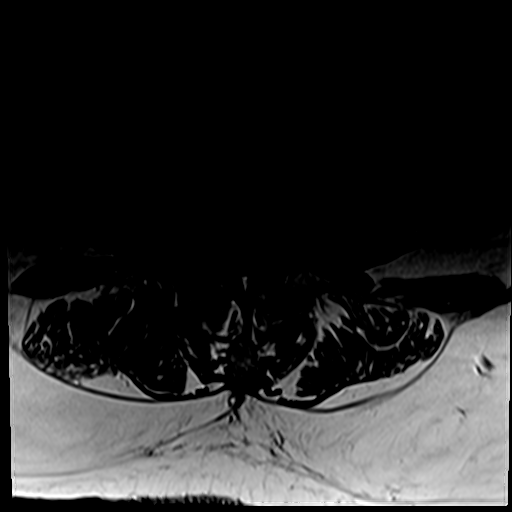
[im 27/40]
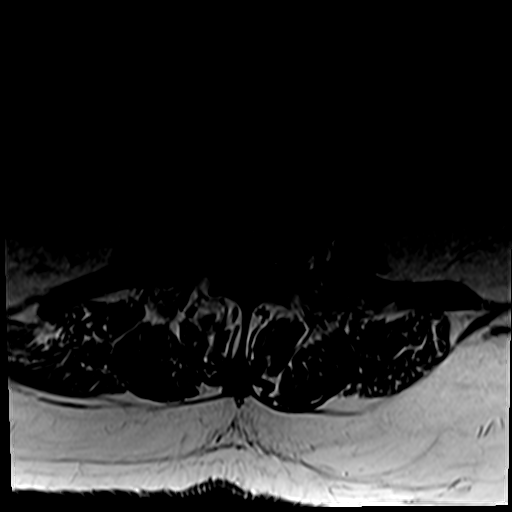
[im 35/40]
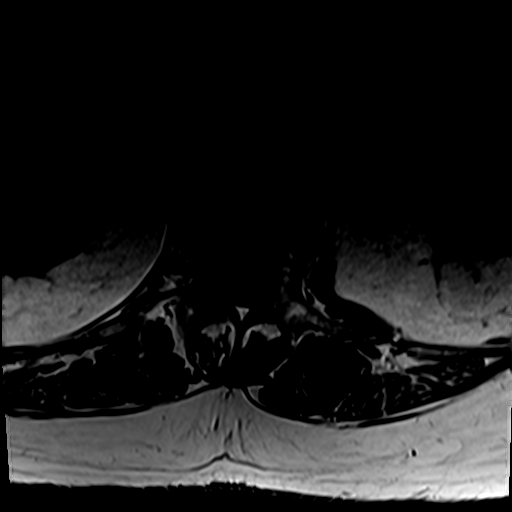
[im 40/40]
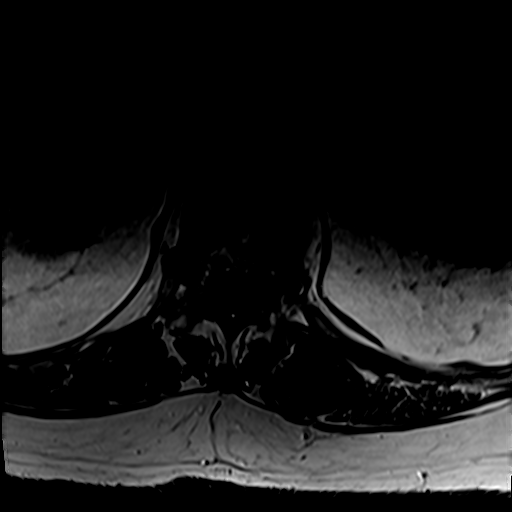

[Series 10: T1 fat-sat post-contrast · sagittal · 4.0mm · 0.81mm/px · 4 of 17 slices shown]
[im 1/17]
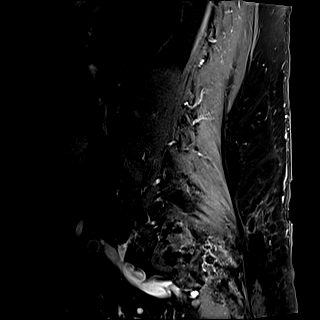
[im 6/17]
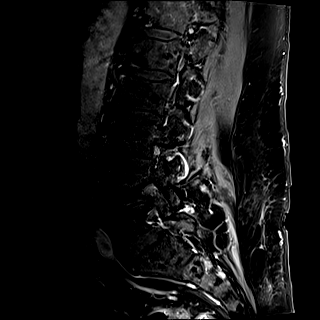
[im 11/17]
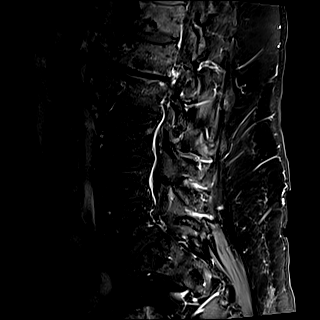
[im 17/17]
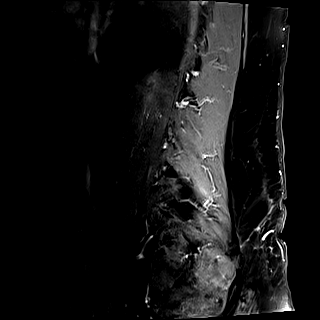

[31 of 48 positions shown; findings below may reference images not displayed]

FINDINGS: Segmentation:  Standard.

Alignment: Grade 1 anterolisthesis of L5 on S1 secondary to
bilateral L5 pars interarticularis defects. Minimal retrolisthesis
of L2 on L3 and L4 on L5.

Vertebrae: No acute fracture, evidence of discitis, or aggressive
bone lesion. Hemangiomas scattered throughout the lumbosacral spine.
10 mm sclerotic bone lesion in the left lateral aspect of the L2
vertebral body and a 7 mm sclerotic bone lesion in the S2 vertebral
body which may reflect small bone islands versus less likely
metastatic disease given the appearance on the prior CT dated
03/13/2017 and no significant interval change.

Conus medullaris and cauda equina: Conus extends to the L1 level.
Conus and cauda equina appear normal.

Paraspinal and other soft tissues: No acute paraspinal abnormality.

Disc levels:

Disc spaces: Disc desiccation throughout the lumbar spine. Disc
height loss at L1-2 with reactive endplate changes. Disc height loss
at L4-5.

T12-L1: Mild broad-based disc bulge. No foraminal or central canal
stenosis.

L1-L2: Broad-based disc osteophyte complex with a prominent right
lateral component. Mild bilateral facet arthropathy. Mild spinal
stenosis. Moderate right foraminal stenosis. No left foraminal
stenosis.

L2-L3: Mild broad-based disc bulge. Mild spinal stenosis. Mild
bilateral facet arthropathy. Mild left foraminal stenosis. No right
foraminal stenosis.

L3-L4: Mild broad-based disc bulge. Mild bilateral facet
arthropathy. No foraminal or central canal stenosis.

L4-L5: Mild broad-based disc bulge. Mild bilateral facet
arthropathy. 4 mm left facet posterior extra-spinal synovial cyst.
Moderate left and mild right foraminal stenosis. No spinal stenosis.

L5-S1: Broad-based disc bulge. Mild bilateral facet arthropathy.
Moderate bilateral foraminal stenosis. No spinal stenosis.
IMPRESSION: 1. Diffuse lumbar spine spondylosis as described above.
2. No acute osseous injury of the lumbar spine.
3. No aggressive osseous lesions to suggest metastatic disease.

## 2022-05-19 ENCOUNTER — Ambulatory Visit: Payer: Medicare Other

## 2022-05-19 ENCOUNTER — Ambulatory Visit
Admission: RE | Admit: 2022-05-19 | Discharge: 2022-05-19 | Disposition: A | Discharge status: Home / Self Care | Payer: Medicare Other | Source: Ambulatory Visit | Attending: Cardiovascular Disease | Admitting: Cardiovascular Disease

## 2022-05-19 ENCOUNTER — Encounter
Admission: RE | Disposition: A | Discharge status: Home / Self Care | Payer: Medicare Other | Source: Ambulatory Visit | Attending: Cardiovascular Disease

## 2022-05-19 ENCOUNTER — Ambulatory Visit: Payer: Medicare Other | Admitting: Certified Registered Nurse Anesthetist

## 2022-05-19 ENCOUNTER — Encounter: Payer: Self-pay | Admitting: Cardiovascular Disease

## 2022-05-19 ENCOUNTER — Other Ambulatory Visit: Payer: Self-pay

## 2022-05-19 DIAGNOSIS — D500 Iron deficiency anemia secondary to blood loss (chronic): Secondary | ICD-10-CM | POA: Diagnosis not present

## 2022-05-19 DIAGNOSIS — Z87891 Personal history of nicotine dependence: Secondary | ICD-10-CM | POA: Insufficient documentation

## 2022-05-19 DIAGNOSIS — K625 Hemorrhage of anus and rectum: Secondary | ICD-10-CM | POA: Diagnosis not present

## 2022-05-19 DIAGNOSIS — D631 Anemia in chronic kidney disease: Secondary | ICD-10-CM | POA: Diagnosis not present

## 2022-05-19 DIAGNOSIS — I4819 Other persistent atrial fibrillation: Secondary | ICD-10-CM | POA: Insufficient documentation

## 2022-05-19 DIAGNOSIS — I130 Hypertensive heart and chronic kidney disease with heart failure and stage 1 through stage 4 chronic kidney disease, or unspecified chronic kidney disease: Secondary | ICD-10-CM | POA: Insufficient documentation

## 2022-05-19 DIAGNOSIS — Z79899 Other long term (current) drug therapy: Secondary | ICD-10-CM | POA: Insufficient documentation

## 2022-05-19 DIAGNOSIS — I5032 Chronic diastolic (congestive) heart failure: Secondary | ICD-10-CM | POA: Insufficient documentation

## 2022-05-19 DIAGNOSIS — Z6841 Body Mass Index (BMI) 40.0 and over, adult: Secondary | ICD-10-CM | POA: Insufficient documentation

## 2022-05-19 DIAGNOSIS — I509 Heart failure, unspecified: Secondary | ICD-10-CM | POA: Diagnosis not present

## 2022-05-19 DIAGNOSIS — N189 Chronic kidney disease, unspecified: Secondary | ICD-10-CM | POA: Diagnosis not present

## 2022-05-19 DIAGNOSIS — Z9884 Bariatric surgery status: Secondary | ICD-10-CM | POA: Diagnosis not present

## 2022-05-19 DIAGNOSIS — I4891 Unspecified atrial fibrillation: Secondary | ICD-10-CM | POA: Diagnosis not present

## 2022-05-19 DIAGNOSIS — Z7901 Long term (current) use of anticoagulants: Secondary | ICD-10-CM | POA: Diagnosis not present

## 2022-05-19 HISTORY — PX: CARDIOVERSION: SHX1299

## 2022-05-19 HISTORY — DX: Unilateral inguinal hernia, without obstruction or gangrene, not specified as recurrent: K40.90

## 2022-05-19 SURGERY — CARDIOVERSION
Anesthesia: General

## 2022-05-19 MED ORDER — SODIUM CHLORIDE 0.9 % IV SOLN: INTRAVENOUS | Status: DC

## 2022-05-19 MED ORDER — ACETAMINOPHEN 160 MG/5ML PO SOLN
325.0000 mg | ORAL | Status: DC | PRN
  Filled 2022-05-19: qty 20.3

## 2022-05-19 MED ORDER — ACETAMINOPHEN 325 MG PO TABS: 650.0000 mg | ORAL_TABLET | Freq: Once | ORAL | Status: DC | PRN

## 2022-05-19 MED ORDER — ONDANSETRON HCL 4 MG/2ML IJ SOLN: 4.0000 mg | Freq: Once | INTRAMUSCULAR | Status: DC | PRN

## 2022-05-19 MED ORDER — PROPOFOL 10 MG/ML IV BOLUS
INTRAVENOUS | Status: DC | PRN
  Administered 2022-05-19: 40 mg via INTRAVENOUS
  Administered 2022-05-19: 30 mg via INTRAVENOUS
  Administered 2022-05-19: 70 mg via INTRAVENOUS
  Administered 2022-05-19 (×2): 30 mg via INTRAVENOUS

## 2022-05-19 NOTE — Anesthesia Postprocedure Evaluation (Signed)
Anesthesia Post Note  Patient: [name]  Procedure(s) Performed: CARDIOVERSION  Patient location during evaluation: PACU Anesthesia Type: General Level of consciousness: awake and alert, oriented and patient cooperative Pain management: pain level controlled Vital Signs Assessment: post-procedure vital signs reviewed and stable Respiratory status: spontaneous breathing, nonlabored ventilation and respiratory function stable Cardiovascular status: blood pressure returned to baseline and stable Postop Assessment: adequate PO intake Anesthetic complications: no   No notable events documented.   Last Vitals:  Vitals:   05/19/22 0801 05/19/22 0815  BP:  105/72  Pulse: 66 (!) 57  Resp: 16 13  Temp:    SpO2: 95% 96%    Last Pain:  Vitals:   05/19/22 0815  TempSrc:   PainSc: Asleep                 Reed Breech

## 2022-05-19 NOTE — Transfer of Care (Signed)
Immediate Anesthesia Transfer of Care Note  Patient: [name]  Procedure(s) Performed: CARDIOVERSION  Patient Location: Nursing Unit  Anesthesia Type:General  Level of Consciousness: awake and alert   Airway & Oxygen Therapy: Patient Spontanous Breathing and Patient connected to nasal cannula oxygen  Post-op Assessment: Report given to RN and Post -op Vital signs reviewed and stable  Post vital signs: Reviewed and stable  Last Vitals:  Vitals Value Taken Time  BP 109/76 05/19/22 0800  Temp    Pulse 67 05/19/22 0802  Resp 18 05/19/22 0802  SpO2 96 % 05/19/22 0802  Vitals shown include unvalidated device data.  Last Pain:  Vitals:   05/19/22 0714  TempSrc: Oral  PainSc: 2          Complications: No notable events documented.

## 2022-05-19 NOTE — Anesthesia Preprocedure Evaluation (Addendum)
Anesthesia Evaluation  Patient identified by MRN, date of birth, ID band Patient awake    Reviewed: Allergy & Precautions, NPO status , Patient's Chart, lab work & pertinent test results  History of Anesthesia Complications Negative for: history of anesthetic complications  Airway Mallampati: III       Dental  (+) Missing   Pulmonary former smoker (quit 1981)   Pulmonary exam normal breath sounds clear to auscultation       Cardiovascular hypertension, +CHF (diastolic dysfunction)  + dysrhythmias (a fib on Xarelto)  Rhythm:Irregular Rate:Normal  ECG 05/04/22: A fib, low voltage   Neuro/Psych Seizures -,  PSYCHIATRIC DISORDERS Anxiety     Chronic pain    GI/Hepatic negative GI ROS,,,  Endo/Other  Class 3 obesity  Renal/GU Renal disease (CKD, nephrolithiasis)     Musculoskeletal   Abdominal   Peds  Hematology negative hematology ROS (+)   Anesthesia Other Findings Cardiology note 05/04/22:  Assessment and Plan:  Atrial Fibrillation  persistent    Anemia secondary to GI blood loss question cause possible hemorrhoids   Morbid obesity   Amiodarone therapy x 8 years     The patient has persistent atrial fibrillation and did not hold following most recent cardioversion but for few weeks.  His anemia may have contributed to this and so as this is being addressed, we will reload him with amiodarone and attempt cardioversion again in a couple of weeks.  I am perhaps more sanguine to Dr. Knute Neu that this will be of some benefit in the short-intermediate term.   He may well be right however, which begs the question as to next options.  Unfortunately, dofetilide or sotalol will not be an option for some time given the long time exposure to amiodarone.  Will need to reassess left ventricular function and size prior to deciding about the possibility of a 1C agent.  Catheter ablation might be our other option albeit with some degree  of reluctance given his morbid obesity.  I suspect he has sleep apnea also and a sleep study will be recommended to his PCP   Will reassess left ventricular function and left chamber volumes   Ongoing evaluation for his breathing.   Reproductive/Obstetrics                             Anesthesia Physical Anesthesia Plan  ASA: 3  Anesthesia Plan: General   Post-op Pain Management:    Induction: Intravenous  PONV Risk Score and Plan: 2 and Propofol infusion, TIVA and Treatment may vary due to age or medical condition  Airway Management Planned: Natural Airway  Additional Equipment:   Intra-op Plan:   Post-operative Plan:   Informed Consent: I have reviewed the patients History and Physical, chart, labs and discussed the procedure including the risks, benefits and alternatives for the proposed anesthesia with the patient or authorized representative who has indicated his/her understanding and acceptance.       Plan Discussed with: CRNA  Anesthesia Plan Comments: (LMA/GETA backup discussed.  Patient consented for risks of anesthesia including but not limited to:  - adverse reactions to medications - damage to eyes, teeth, lips or other oral mucosa - nerve damage due to positioning  - sore throat or hoarseness - damage to heart, brain, nerves, lungs, other parts of body or loss of life  Informed patient about role of CRNA in peri- and intra-operative care.  Patient voiced understanding.)  Anesthesia Quick Evaluation  

## 2022-05-19 NOTE — CV Procedure (Signed)
Cardioversion procedure note For atrial fibrillation, persistent.  Procedure Details:  Consent: Risks of procedure as well as the alternatives and risks of each were explained to the (patient/caregiver).  Consent for procedure obtained.  Time Out: Verified patient identification, verified procedure, site/side was marked, verified correct patient position, special equipment/implants available, medications/allergies/relevent history reviewed, required imaging and test results available.  Performed  Patient placed on cardiac monitor, pulse oximetry, supplemental oxygen as necessary.   Sedation given: propofol IV, Dr. Ronni Rumble Pacer pads placed anterior and posterior chest.   Cardioverted 5 time(s).   Cardioverted at  150, 200J, 200 J with manual compression Pads placed anterior lateral, 200 J, 200 J with compression.  Synchronized biphasic Unable to convert to normal sinus rhythm   Evaluation: Findings: Post procedure EKG shows: Atrial fibrillation Complications: None Patient did tolerate procedure well.  Time Spent Directly with the Patient:  45 minutes   Dossie Arbour, M.D., Ph.D.

## 2022-05-20 ENCOUNTER — Other Ambulatory Visit: Payer: Self-pay | Admitting: Cardiovascular Disease

## 2022-05-20 NOTE — H&P (Signed)
H&P Addendum, pre-cardioversion ° °Patient was seen and evaluated prior to -cardioversion procedure °Symptoms, prior testing details again confirmed with the patient °Patient examined, no significant change from prior exam °Lab work reviewed in detail personally by myself °Patient understands risk and benefit of the procedure,  °The risks (stroke, cardiac arrhythmias rarely resulting in the need for a temporary or permanent pacemaker, skin irritation or burns and complications associated with conscious sedation including aspiration, arrhythmia, respiratory failure and death), benefits (restoration of normal sinus rhythm) and alternatives of a direct current cardioversion were explained in detail °Patient willing to proceed. ° °Signed, °Tim Cordarrel Stiefel, MD, Ph.D °[facility]  °

## 2022-05-20 NOTE — Interval H&P Note (Signed)
History and Physical Interval Note:  05/20/2022 1:10 PM  [name]  has presented today for surgery, with the diagnosis of Cardioversion   Afib OK 2nd case per Dr Lorette Ang  Start time 8a.  The various methods of treatment have been discussed with the patient and family. After consideration of risks, benefits and other options for treatment, the patient has consented to  Procedure(s): CARDIOVERSION (N/A) as a surgical intervention.  The patient's history has been reviewed, patient examined, no change in status, stable for surgery.  I have reviewed the patient's chart and labs.  Questions were answered to the patient's satisfaction.     [name]

## 2022-05-22 NOTE — Telephone Encounter (Signed)
Pt last saw Dr Graciela Husbands 05/04/22, last labs 05/15/22 Creat 1.07, age 67, weight 151kg, CrCl 141.12, based on CrCl pt is on appropriate dosage of Xarelto 20mg  QD for afib.  Will refill rx.

## 2022-05-22 NOTE — Telephone Encounter (Signed)
Refill request for Xarelto 

## 2022-05-23 ENCOUNTER — Ambulatory Visit: Payer: Medicare Other | Attending: Orthopedic Surgery

## 2022-05-23 DIAGNOSIS — M25512 Pain in left shoulder: Secondary | ICD-10-CM | POA: Insufficient documentation

## 2022-05-23 DIAGNOSIS — G8929 Other chronic pain: Secondary | ICD-10-CM

## 2022-05-23 DIAGNOSIS — M6281 Muscle weakness (generalized): Secondary | ICD-10-CM

## 2022-05-23 DIAGNOSIS — M25511 Pain in right shoulder: Secondary | ICD-10-CM | POA: Diagnosis not present

## 2022-05-23 NOTE — Therapy (Unsigned)
OUTPATIENT PHYSICAL THERAPY SHOULDER TREATMENT  Patient Name: [name] MRN: [id number] DOB:May 01, 1955, 67 y.o., male Today's Date: 05/24/2022   PT End of Session - 05/24/22 2213     Visit Number 26    Number of Visits 49    Date for PT Re-Evaluation 06/20/22    Authorization Type [employer] Medicare    Authorization Time Period U[employer] medicare 2024  XL:[place] on [place]    PT Start Time 1402    PT Stop Time 1445    PT Time Calculation (min) 43 min    Activity Tolerance Patient tolerated treatment well    Behavior During Therapy WFL for tasks assessed/performed            Past Medical History:  Diagnosis Date   Anxiety    Aortic atherosclerosis (HCC)    Cardiomyopathy (in setting of Afib)    a.) TTE 12/26/2013: EF 45-50%, mild ant and antsept HK. mild MR. Mod dil LA. nl RV fxn. Rhythm was Afib; b.) TTE 07/04/2019: EF 55%, mid-apical anteroseptal HK, mild MAC, mild Ao sclerosis, G2DD, RVSP 45.3   Chronic pain syndrome    a.) followed by pain management   Chronic, continuous use of opioids    a.) hydrocodone/APAP 7.5/325 mg; followed by pain management   Coronary artery disease 11/06/2014   a.) cCTA 11/06/2014: Ca score 224 (all in pLAD) -- 74th percentile for age/sex matched control   Diverticulosis    DJD (degenerative joint disease) of knee    History of hiatal hernia    History of kidney stones 2012   Hyperlipidemia    Hyperplastic colon polyp    Hypertension    Inguinal hernia    left   Internal hemorrhoids    Intervertebral disc disorder with radiculopathy of lumbosacral region    Long term current use FULL DOSE (325 mg) aspirin    Long term current use of amiodarone    Long term current use of antithrombotics/antiplatelets    a.) rivaroxaban   Lower extremity edema    Mixed hyperlipidemia    Myalgia due to statin    Osteoarthritis    PAF (paroxysmal atrial fibrillation) (HCC)    a.) CHA2DS2VASc = 4 (age, HTN, CHF, vascular disease history);  b.) s/p DCCV 03/13/2014 (200  J x1); c.) s/p DCCV 05/15/2019 (150 J x 1, 200 J x2); d.) rate/rhythm maintained on oral amiodarone + metoprolol succinate; chronically anticoagulated with rivaroxaban   Pernicious anemia    Prostate cancer (HCC)    Pulmonary nodule, right    a. 10/2014 Cardiac CTA: 7mm RLL nodule; b. 04/2015 CT Chest: stable 7mm RLL nodule. No new nodules; 02/2017 CTA Chest: stable, benign, 7mm RLL pulm nodule.   Sleep difficulties    a.) takes melatonin + trazodone PRN   SVT (supraventricular tachycardia)    Tubular adenoma of colon    Past Surgical History:  Procedure Laterality Date   BICEPT TENODESIS Right 09/27/2021   Procedure: Right reverse shoulder arthroplasty, biceps tenodesis;  Surgeon: Signa Kell, MD;  Location: [facility];  Service: Orthopedics;  Laterality: Right;   CARDIOVERSION N/A 05/15/2019   Procedure: CARDIOVERSION;  Surgeon: Antonieta Iba, MD;  Location: [facility];  Service: Cardiovascular;  Laterality: N/A;   CARDIOVERSION N/A 03/13/2014   Procedure: CARDIOVERSION; Location: [facility]; Surgeon: [name], MD   CARDIOVERSION N/A 03/24/2022   Procedure: CARDIOVERSION;  Surgeon: Antonieta Iba, MD;  Location: [facility];  Service: Cardiovascular;  Laterality: N/A;   CARDIOVERSION N/A 05/19/2022   Procedure:  CARDIOVERSION;  Surgeon: Antonieta Iba, MD;  Location: [facility];  Service: Cardiovascular;  Laterality: N/A;   CARPAL TUNNEL RELEASE Right 12/23/2014   Procedure: CARPAL TUNNEL RELEASE;  Surgeon: Deeann Saint, MD;  Location: [facility];  Service: Orthopedics;  Laterality: Right;   CARPAL TUNNEL RELEASE Left 01/06/2015   Procedure: CARPAL TUNNEL RELEASE;  Surgeon: Deeann Saint, MD;  Location: [facility];  Service: Orthopedics;  Laterality: Left;   COLONOSCOPY WITH PROPOFOL N/A 10/08/2017   Procedure: COLONOSCOPY WITH PROPOFOL;  Surgeon: Toney Reil, MD;  Location: [facility];  Service: Gastroenterology;  Laterality: N/A;   GASTRIC BYPASS  01/17/2000   KNEE SURGERY Right     knee trauma x3   prostate seeding     REVERSE SHOULDER ARTHROPLASTY Right 09/27/2021   Procedure: Right reverse shoulder arthroplasty, biceps tenodesis;  Surgeon: Signa Kell, MD;  Location: [facility];  Service: Orthopedics;  Laterality: Right;   SHOULDER ARTHROSCOPY WITH ROTATOR CUFF REPAIR AND OPEN BICEPS TENODESIS Left 12/27/2021   Procedure: Left shoulder arthroscopic cuff repair (supraspinatus and subscapularis) with Regeneten Patch application;  Surgeon: Signa Kell, MD;  Location: [facility];  Service: Orthopedics;  Laterality: Left;   Patient Active Problem List   Diagnosis Date Noted   Symptomatic anemia 04/26/2022   Chronic pain of both knees 03/30/2022   Bilateral primary osteoarthritis of knee 03/30/2022   Shortness of breath 03/24/2022   Vitamin D deficiency 02/24/2022   S/p reverse total shoulder arthroplasty 09/27/2021   Lesion of bone of lumbosacral spine (L5) 05/12/2021   Localized osteoarthritis of shoulder regions, bilateral 03/29/2021   Chronic pain of both shoulders 03/29/2021   Drug-induced myopathy 02/21/2021   Trigger finger of right hand 11/15/2020   Prostate cancer (HCC) 07/26/2020   Insomnia 08/01/2019   Primary osteoarthritis of both wrists 02/17/2019   Trigger middle finger of left hand 02/17/2019   Chronic pain syndrome 02/17/2019   Chronic radicular lumbar pain 10/08/2018   Lumbar radiculopathy 10/08/2018   Lumbar degenerative disc disease 10/08/2018   Lumbar facet arthropathy 10/08/2018   Lumbar facet joint syndrome 10/08/2018   Intervertebral disc disorder with radiculopathy of lumbosacral region    Osteoarthritis of knee 11/30/2017   Osteoarthritis of wrist 11/30/2017   Pernicious anemia 05/22/2016   Persistent atrial fibrillation (HCC) 12/20/2015   Carpal tunnel syndrome 11/16/2014   DJD (degenerative joint disease) of knee 10/27/2014   Bilateral carpal tunnel syndrome 10/27/2014   Morbid obesity with BMI of 50.0-59.9, adult (HCC) 10/27/2014    Mixed hyperlipidemia 10/27/2014   H/O gastric bypass 03/20/2014   Hyperkalemia 03/20/2014   History of prostate cancer 12/26/2013   Essential hypertension 12/26/2013   Encounter for anticoagulation discussion and counseling 12/26/2013   PCP: Smitty Cords, DO  REFERRING PROVIDER: Signa Kell, MD  REFERRING DIAGNOSIS: Incomplete tear of left rotator cuff, unspecified whether traumatic M75.112   THERAPY DIAG: Chronic left shoulder pain  Chronic right shoulder pain  Muscle weakness (generalized)  RATIONALE FOR EVALUATION AND TREATMENT: Rehabilitation  ONSET DATE: Surgery 12/27/21, Chronic L shoulder pain for multiple years  FOLLOW UP APPT WITH PROVIDER: Yes    FROM INITIAL EVALUATION SUBJECTIVE:  Chief Complaint: S/p L shoulder RTC repair, biceps tenodesis, and shoulder debridement  Pertinent History Pt with history of chronic bilateral shoulder pain. He underwent right reverse TSR and right biceps tenodesis on 09/27/21. He experienced post-operative instability in R shoulder however it improved and he was able to avoid a revision. On 12/27/21 he underwent left arthroscopic rotator cuff repair (subscapularis (full-thickness), supraspinatus (partial-thickness) side-to-side repair + Regeneten patch), left arthroscopic biceps tenodesis, and left extensive debridement of shoulder (glenohumeral and subacromial spaces). Per surgical note other intraoperative findings include Type 2 SLAP tear, degenerative anterior labral tearing, grade 2 degenerative changes to articular cartilage of humeral head, and significant posterior capsule synovitis. No significant post-operative complications. He arrives without abduction pillow and states that Dr. Allena Katz advised him he doesn't have to wear the abduction  pillow. He got behind on his pain medication today and states that today his shoulder has been the most painful he has experienced since surgery. He also noticed swelling in L lateral upper arm this morning. He did take his pain medication at 14:00, before PT evaluation.  He has a history of chronic back pain and underwent a lumbar facet, medial branch radiofrequency ablation on 01/02/22. At the time of the evaluation pt has not yet appreciated improvement in his back pain from this procedure. He has a past medical history of Cardiomyopathy (in setting of Afib), DJD (degenerative joint disease) of knee, History of kidney stones, Intervertebral disc disorder with radiculopathy of lumbosacral region, Kidney stone (2012), Lower extremity edema, PAF (paroxysmal atrial fibrillation) (HCC), Pernicious anemia, Prostate cancer (HCC), Pulmonary nodule, right, and SVT (supraventricular tachycardia) (HCC). He also  has a past surgical history that includes Knee surgery; Gastric bypass (2002); prostate seeding; Carpal tunnel release (Right, 12/23/2014); Carpal tunnel release (Left, 01/06/2015); Colonoscopy with propofol (N/A, 10/08/2017); and Cardioversion (N/A, 05/15/2019).   Pain:  Pain Intensity: Present: 5/10, Best: 3/10, Worst: 10/10 Pain location: Anterior L shoulder Pain Quality: aching and throbbing Radiating: No  Numbness/Tingling: No Focal Weakness: Yes History of prior shoulder or neck/shoulder injury, pain, surgery, or therapy: Yes, chronic bilateral shoulder pain with R TSR. 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 No AROM or WB through LUE (per protocol), Passive elevation and ER in staged ROM to begin at week 4 (elevation 90 degrees weeks 4-6 with no ER; 0-120 weeks 6-8 with ER 0-30 degrees). No pulleys or cane until after 6 weeks, recommend no driving x 6-8 weeks.   Weight Bearing Restrictions: Yes No WB through LUE for at least 6 weeks  Living Environment Lives with: lives with their spouse Lives in: House/apartment  Patient Goals: Improve L shoulder strength and pain. Pt would like to be able to play with/lift his grandson as well as take care of his property.  OBJECTIVE:   Patient Surveys  FOTO: To be completed QuickDASH: 47.7%  Cognition Patient is oriented to person, place, and time.  Recent memory is intact.  Remote memory is intact.  Attention span and concentration are intact.  Expressive speech is intact.  Patient's fund of knowledge is within normal limits for educational level.  Gross Musculoskeletal Assessment Tremor: None Bulk: Mild swelling noted in L lateral upper arm. Pt denies chills, fevers, redness, or dischrage from inicisions Tone: Normal  Posture Forward head and rounded shoulders  AROM Full L wrist AROM flexion, extension, radial deviation, and ulnar deviation. MCP flexion and extension WNL. Unable to assess L shoulder and elbow AROM per protocol  PROM Full L elbow PROM flexion and extension however during first attempt at slow passive extension pt reports sudden pain in L shoulder which eventually improve after a couple minutes of rest. Full L elbow PROM pronation/supination.   LE MMT: Full L wrist strength for flexion, extension, radial deviation, and ulnar deviation. L grip strength is intact and strong. Deferred testing of L elbow and shoulder strength per protocol  Sensation Grossly intact to light touch throughout LUE as determined by  testing dermatomes C2-T2. Proprioception and hot/cold testing deferred on this date.  Palpation Pt is generally tender to light palpation around anterior, lateral, and posterior L shoulder;    TODAY'S TREATMENT    SUBJECTIVE: Pt reports his shoulders are doing alright today. He had his fourth iron infusion with a slight improvement in his fatigue. He underwent an unsuccessful cardioversion but remained in afib. His back continues to be painful with activity. His R shoulder is doing alright but he is still having a lot of L shoulder pain.   PAIN: No resting shoulder pain reported upon arrival   Ther-ex  All exercises performed sitting fully upright today unless otherwise specified: R shoulder flexion from neutral to 130 degrees with 4# dumbbell (DB) 2 x 10 R shoulder scaption from neutral to around 90 degrees with 4# DB 2 x 10; Overhead R shoulder press, 4# DB 2 x 10; R shoulder internal rotation from forearm vertical to stomach with manual resistance from therapist 2 x 10; R shoulder external rotation from stomach to forearm vertical with manual resistance from therapist 2 x 10; Seated chest press with cane and manual resistance from therapist 2 x 10; Seated rows with cane and manual resistance from therapist 2 x 10; L shoulder AROM flexion with straight elbow from neutral to around 130 degrees with 2# DB x 10, un weighted x 10; Attempted L shoulder AROM scaption with 2# DB and un weighted but discontinued secondary to weakness and increase in pain; L elbow AROM flexion with light manual resistance 2 x 15; L elbow manually resisted extension 2 x 15; L shoulder IR from vertical to stomach with light manual resistance 2 x 10, no pain reported; L shoulder ER from stomach to vertical with light manual resistance 2 x 10, no pain reported;    Not performed: Seated shoulder extension from 60 flexion to neutral with green tband 2 x 10 BUE; Seated shoulder rows never moving elbows past mid  axilla, green tband 2 x 10 BUE; L shoulder manually resisted isometric extension 3s hold x 10;   PATIENT EDUCATION:  Education details:  Pt educated throughout session about proper posture and technique with exercises. Improved exercise technique, movement at target joints, use of target muscles after min to mod verbal, visual, tactile cues. Person educated: Patient Education method: Explanation, verbal cues Education comprehension: verbalized understanding and returned demonstration   HOME EXERCISE PROGRAM: Access Code: [email or web address] URL: [email or web address] Date: 03/28/2022 Prepared by: Ria Comment  Exercises - Wrist Flexion AROM  - 1 x daily - 7 x weekly - 2 sets - 10 reps - 3s hold - Wrist Extension AROM  - 1 x daily -  7 x weekly - 2 sets - 10 reps - 3s hold - Seated Scapular Retraction  - 1 x daily - 7 x weekly - 2 sets - 10 reps - 3s hold - Standing Elbow Flexion Extension AROM  - 1 x daily - 7 x weekly - 2 sets - 10 reps - 3s hold - Standing Shoulder Flexion to 90 Degrees  - 1 x daily - 7 x weekly - 2 sets - 10 reps - 3s hold - Shoulder Abduction - Thumbs Up  - 1 x daily - 7 x weekly - 2 sets - 10 reps - 3s hold   ASSESSMENT:  CLINICAL IMPRESSION: Pt demonstrates excellent motivation during session today. R shoulder continues to progress well however considerable weakness and pain noted in L shoulder abduction. Pt encouraged to continue HEP and follow-up as scheduled. Encouraged pt to mention L shoulder abduction weakness to orthopedic surgeon at follow-up. Plan to progress LUE per protocol as pt is able to tolerate in addition to progressing resistance with RUE strengthening against gravity. He will benefit from skilled PT to address deficits in range of motion and strength in order to improve overall function at home and with leisure activities such as caring for his property.  REHAB POTENTIAL: Good  CLINICAL DECISION MAKING: Evolving/moderate  complexity  EVALUATION COMPLEXITY: Moderate   GOALS: Goals reviewed with patient? Yes  SHORT TERM GOALS: Target date: 05/09/2022   Pt will be independent with HEP to improve strength and decrease right shoulder pain to improve pain-free function at home and work. Baseline:  Goal status: ONGOING   LONG TERM GOALS: Target date: 06/20/22;  Pt will increase L shoulder FOTO to at least 51 to demonstrate significant improvement in function at home and with leisure activities related to L shoulder. Baseline: 01/03/22: To be completed; 01/24/22: 4; 02/28/22: 59; 04/11/22: 55; Goal status: ACHIEVED  2.  Pt will decrease worst L shoulder pain by at least 3 points on the NPRS in order to demonstrate clinically significant reduction in R shoulder pain. Baseline: 01/03/22: worst: 10/10; 02/28/22: 10/10; 04/11/22: 4/10; Goal status: ACHIEVED  3.  Pt will decrease quick DASH score by at least 8% in order to demonstrate clinically significant reduction in disability related to L shoulder pain        Baseline: 01/03/22: 47.7%; 02/28/22: 65.91%; 04/11/22: 56.8%; Goal status: ONGOING  4. Pt will demonstrate L shoulder flexion, abduction, IR, and ER strength of at least 4/5 in order to demonstrate improvement in strength and function       Baseline: 01/03/22: Unable to test per protocol; 02/28/22: At least 3/5 but unable to fully test due to protocol; 04/11/22: flexion: 4/5, abduction: 4-/5 (IR and ER at least 3/5 but not tested); Goal status: PARTIALLY MET  PLAN: PT FREQUENCY: 1-2x/week  PT DURATION: 12 weeks  PLANNED INTERVENTIONS: Therapeutic exercises, Therapeutic activity, Neuromuscular re-education, Balance training, Gait training, Patient/Family education, Joint manipulation, Joint mobilization, Vestibular training, Canalith repositioning, Aquatic Therapy, Dry Needling, Electrical stimulation, Spinal manipulation, Spinal mobilization, Cryotherapy, Moist heat, Traction, Ultrasound, Ionotophoresis 4mg /ml  Dexamethasone, and Manual therapy  PLAN FOR NEXT SESSION: Progress L shoulder ROM and strength per protocol, continue R shoulder strengthening   Sharalyn Ink Harjot Dibello PT, DPT, GCS  Physical Therapist- [place]  [facility]

## 2022-05-25 ENCOUNTER — Ambulatory Visit: Payer: Medicare Other

## 2022-05-25 DIAGNOSIS — G8929 Other chronic pain: Secondary | ICD-10-CM

## 2022-05-25 DIAGNOSIS — M6281 Muscle weakness (generalized): Secondary | ICD-10-CM | POA: Diagnosis not present

## 2022-05-25 DIAGNOSIS — M25512 Pain in left shoulder: Secondary | ICD-10-CM | POA: Diagnosis not present

## 2022-05-25 DIAGNOSIS — M25511 Pain in right shoulder: Secondary | ICD-10-CM | POA: Diagnosis not present

## 2022-05-25 NOTE — Therapy (Signed)
OUTPATIENT PHYSICAL THERAPY SHOULDER TREATMENT  Patient Name: [name] MRN: [id number] DOB:[birth year], 67 y.o., male Today's Date: 05/26/2022   PT End of Session - 05/26/22 0851     Visit Number 27    Number of Visits 49    Date for PT Re-Evaluation 06/20/22    Authorization Type [employer] Medicare    Authorization Time Period [employer] medicare 2024  NW:GNFAO on [place]    PT Start Time 1405    PT Stop Time 1445    PT Time Calculation (min) 40 min    Activity Tolerance Patient tolerated treatment well    Behavior During Therapy WFL for tasks assessed/performed            Past Medical History:  Diagnosis Date   Anxiety    Aortic atherosclerosis (HCC)    Cardiomyopathy (in setting of Afib)    a.) TTE 12/26/2013: EF 45-50%, mild ant and antsept HK. mild MR. Mod dil LA. nl RV fxn. Rhythm was Afib; b.) TTE 07/04/2019: EF 55%, mid-apical anteroseptal HK, mild MAC, mild Ao sclerosis, G2DD, RVSP 45.3   Chronic pain syndrome    a.) followed by pain management   Chronic, continuous use of opioids    a.) hydrocodone/APAP 7.5/325 mg; followed by pain management   Coronary artery disease 11/06/2014   a.) cCTA 11/06/2014: Ca score 224 (all in pLAD) -- 74th percentile for age/sex matched control   Diverticulosis    DJD (degenerative joint disease) of knee    History of hiatal hernia    History of kidney stones 2012   Hyperlipidemia    Hyperplastic colon polyp    Hypertension    Inguinal hernia    left   Internal hemorrhoids    Intervertebral disc disorder with radiculopathy of lumbosacral region    Long term current use FULL DOSE (325 mg) aspirin    Long term current use of amiodarone    Long term current use of antithrombotics/antiplatelets    a.) rivaroxaban   Lower extremity edema    Mixed hyperlipidemia    Myalgia due to statin    Osteoarthritis    PAF (paroxysmal atrial fibrillation) (HCC)    a.) CHA2DS2VASc = 4 (age, HTN, CHF, vascular disease history);  b.) s/p DCCV 03/13/2014  (200 J x1); c.) s/p DCCV 05/15/2019 (150 J x 1, 200 J x2); d.) rate/rhythm maintained on oral amiodarone + metoprolol succinate; chronically anticoagulated with rivaroxaban   Pernicious anemia    Prostate cancer (HCC)    Pulmonary nodule, right    a. 10/2014 Cardiac CTA: 7mm RLL nodule; b. 04/2015 CT Chest: stable 7mm RLL nodule. No new nodules; 02/2017 CTA Chest: stable, benign, 7mm RLL pulm nodule.   Sleep difficulties    a.) takes melatonin + trazodone PRN   SVT (supraventricular tachycardia)    Tubular adenoma of colon    Past Surgical History:  Procedure Laterality Date   BICEPT TENODESIS Right 09/27/2021   Procedure: Right reverse shoulder arthroplasty, biceps tenodesis;  Surgeon: Signa Kell, MD;  Location: [facility];  Service: Orthopedics;  Laterality: Right;   CARDIOVERSION N/A 05/15/2019   Procedure: CARDIOVERSION;  Surgeon: Antonieta Iba, MD;  Location: [facility];  Service: Cardiovascular;  Laterality: N/A;   CARDIOVERSION N/A 03/13/2014   Procedure: CARDIOVERSION; Location: [facility]; Surgeon: [name], MD   CARDIOVERSION N/A 03/24/2022   Procedure: CARDIOVERSION;  Surgeon: Antonieta Iba, MD;  Location: [facility];  Service: Cardiovascular;  Laterality: N/A;   CARDIOVERSION N/A 05/19/2022   Procedure:  CARDIOVERSION;  Surgeon: Antonieta Iba, MD;  Location: [facility];  Service: Cardiovascular;  Laterality: N/A;   CARPAL TUNNEL RELEASE Right 12/23/2014   Procedure: CARPAL TUNNEL RELEASE;  Surgeon: Deeann Saint, MD;  Location: [facility];  Service: Orthopedics;  Laterality: Right;   CARPAL TUNNEL RELEASE Left 01/06/2015   Procedure: CARPAL TUNNEL RELEASE;  Surgeon: Deeann Saint, MD;  Location: [facility];  Service: Orthopedics;  Laterality: Left;   COLONOSCOPY WITH PROPOFOL N/A 10/08/2017   Procedure: COLONOSCOPY WITH PROPOFOL;  Surgeon: Toney Reil, MD;  Location: [facility];  Service: Gastroenterology;  Laterality: N/A;   GASTRIC BYPASS  01/17/2000   KNEE SURGERY  Right    knee trauma x3   prostate seeding     REVERSE SHOULDER ARTHROPLASTY Right 09/27/2021   Procedure: Right reverse shoulder arthroplasty, biceps tenodesis;  Surgeon: Signa Kell, MD;  Location: [facility];  Service: Orthopedics;  Laterality: Right;   SHOULDER ARTHROSCOPY WITH ROTATOR CUFF REPAIR AND OPEN BICEPS TENODESIS Left 12/27/2021   Procedure: Left shoulder arthroscopic cuff repair (supraspinatus and subscapularis) with Regeneten Patch application;  Surgeon: Signa Kell, MD;  Location: [facility];  Service: Orthopedics;  Laterality: Left;   Patient Active Problem List   Diagnosis Date Noted   Symptomatic anemia 04/26/2022   Chronic pain of both knees 03/30/2022   Bilateral primary osteoarthritis of knee 03/30/2022   Shortness of breath 03/24/2022   Vitamin D deficiency 02/24/2022   S/p reverse total shoulder arthroplasty 09/27/2021   Lesion of bone of lumbosacral spine (L5) 05/12/2021   Localized osteoarthritis of shoulder regions, bilateral 03/29/2021   Chronic pain of both shoulders 03/29/2021   Drug-induced myopathy 02/21/2021   Trigger finger of right hand 11/15/2020   Prostate cancer (HCC) 07/26/2020   Insomnia 08/01/2019   Primary osteoarthritis of both wrists 02/17/2019   Trigger middle finger of left hand 02/17/2019   Chronic pain syndrome 02/17/2019   Chronic radicular lumbar pain 10/08/2018   Lumbar radiculopathy 10/08/2018   Lumbar degenerative disc disease 10/08/2018   Lumbar facet arthropathy 10/08/2018   Lumbar facet joint syndrome 10/08/2018   Intervertebral disc disorder with radiculopathy of lumbosacral region    Osteoarthritis of knee 11/30/2017   Osteoarthritis of wrist 11/30/2017   Pernicious anemia 05/22/2016   Persistent atrial fibrillation (HCC) 12/20/2015   Carpal tunnel syndrome 11/16/2014   DJD (degenerative joint disease) of knee 10/27/2014   Bilateral carpal tunnel syndrome 10/27/2014   Morbid obesity with BMI of 50.0-59.9, adult (HCC)  10/27/2014   Mixed hyperlipidemia 10/27/2014   H/O gastric bypass 03/20/2014   Hyperkalemia 03/20/2014   History of prostate cancer 12/26/2013   Essential hypertension 12/26/2013   Encounter for anticoagulation discussion and counseling 12/26/2013   PCP: Smitty Cords, DO  REFERRING PROVIDER: Signa Kell, MD  REFERRING DIAGNOSIS: Incomplete tear of left rotator cuff, unspecified whether traumatic M75.112   THERAPY DIAG: Chronic left shoulder pain  Chronic right shoulder pain  Muscle weakness (generalized)  RATIONALE FOR EVALUATION AND TREATMENT: Rehabilitation  ONSET DATE: Surgery 12/27/21, Chronic L shoulder pain for multiple years  FOLLOW UP APPT WITH PROVIDER: Yes    FROM INITIAL EVALUATION SUBJECTIVE:  Chief Complaint: S/p L shoulder RTC repair, biceps tenodesis, and shoulder debridement  Pertinent History Pt with history of chronic bilateral shoulder pain. He underwent right reverse TSR and right biceps tenodesis on 09/27/21. He experienced post-operative instability in R shoulder however it improved and he was able to avoid a revision. On 12/27/21 he underwent left arthroscopic rotator cuff repair (subscapularis (full-thickness), supraspinatus (partial-thickness) side-to-side repair + Regeneten patch), left arthroscopic biceps tenodesis, and left extensive debridement of shoulder (glenohumeral and subacromial spaces). Per surgical note other intraoperative findings include Type 2 SLAP tear, degenerative anterior labral tearing, grade 2 degenerative changes to articular cartilage of humeral head, and significant posterior capsule synovitis. No significant post-operative complications. He arrives without abduction pillow and states that Dr. Allena Katz advised him he doesn't have to wear the  abduction pillow. He got behind on his pain medication today and states that today his shoulder has been the most painful he has experienced since surgery. He also noticed swelling in L lateral upper arm this morning. He did take his pain medication at 14:00, before PT evaluation.  He has a history of chronic back pain and underwent a lumbar facet, medial branch radiofrequency ablation on 01/02/22. At the time of the evaluation pt has not yet appreciated improvement in his back pain from this procedure. He has a past medical history of Cardiomyopathy (in setting of Afib), DJD (degenerative joint disease) of knee, History of kidney stones, Intervertebral disc disorder with radiculopathy of lumbosacral region, Kidney stone (2012), Lower extremity edema, PAF (paroxysmal atrial fibrillation) (HCC), Pernicious anemia, Prostate cancer (HCC), Pulmonary nodule, right, and SVT (supraventricular tachycardia) (HCC). He also  has a past surgical history that includes Knee surgery; Gastric bypass (2002); prostate seeding; Carpal tunnel release (Right, 12/23/2014); Carpal tunnel release (Left, 01/06/2015); Colonoscopy with propofol (N/A, 10/08/2017); and Cardioversion (N/A, 05/15/2019).   Pain:  Pain Intensity: Present: 5/10, Best: 3/10, Worst: 10/10 Pain location: Anterior L shoulder Pain Quality: aching and throbbing Radiating: No  Numbness/Tingling: No Focal Weakness: Yes History of prior shoulder or neck/shoulder injury, pain, surgery, or therapy: Yes, chronic bilateral shoulder pain with R TSR. 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 No AROM or WB through LUE (per protocol), Passive elevation and ER in staged ROM to begin at week 4 (elevation 90 degrees weeks 4-6 with no ER; 0-120 weeks 6-8 with ER 0-30 degrees). No pulleys or cane until after 6 weeks, recommend no driving x 6-8 weeks.   Weight Bearing Restrictions: Yes No WB through LUE for at least 6 weeks  Living Environment Lives with: lives with their spouse Lives in: House/apartment  Patient Goals: Improve L shoulder strength and pain. Pt would like to be able to play with/lift his grandson as well as take care of his property.  OBJECTIVE:   Patient Surveys  FOTO: To be completed QuickDASH: 47.7%  Cognition Patient is oriented to person, place, and time.  Recent memory is intact.  Remote memory is intact.  Attention span and concentration are intact.  Expressive speech is intact.  Patient's fund of knowledge is within normal limits for educational level.  Gross Musculoskeletal Assessment Tremor: None Bulk: Mild swelling noted in L lateral upper arm. Pt denies chills, fevers, redness, or dischrage from inicisions Tone: Normal  Posture Forward head and rounded shoulders  AROM Full L wrist AROM flexion, extension, radial deviation, and ulnar deviation. MCP flexion and extension WNL. Unable to assess L shoulder and elbow AROM per protocol  PROM Full L elbow PROM flexion and extension however during first attempt at slow passive extension pt reports sudden pain in L shoulder which eventually improve after a couple minutes of rest. Full L elbow PROM pronation/supination.   LE MMT: Full L wrist strength for flexion, extension, radial deviation, and ulnar deviation. L grip strength is intact and strong. Deferred testing of L elbow and shoulder strength per protocol  Sensation Grossly intact to light touch throughout LUE as  determined by testing dermatomes C2-T2. Proprioception and hot/cold testing deferred on this date.  Palpation Pt is generally tender to light palpation around anterior, lateral, and posterior L shoulder;    TODAY'S TREATMENT    SUBJECTIVE: Pt reports his shoulders are doing alright today. He continues with significant intermittent L shoulder pain especially when lifting his arm directly out to the side (abduction). He cancelled the follow-up with ortho hoping that when he gets his CT abdomen he can coordinate repeat L shoulder imaging. His back continues to be painful with activity.    PAIN: No resting shoulder pain reported upon arrival   Ther-ex  All exercises performed sitting fully upright today unless otherwise specified: R shoulder flexion from neutral to 130 degrees with 4# dumbbell (DB) 2 x 10 R shoulder scaption from neutral to around 110 degrees with 4# DB 2 x 10; Overhead R shoulder press, 4# DB 2 x 10; R shoulder internal rotation from forearm vertical to stomach with manual resistance from therapist 2 x 10; R shoulder external rotation from stomach to forearm vertical with manual resistance from therapist 2 x 10; R elbow manually resisted flexion 2 x 10; R elbow manually resisted extension 2 x 10; L shoulder AROM flexion with straight elbow from neutral to around 130 degrees 2 x 10, (no weight); L shoulder AROM scaption with straight elbow from neutral to around 110 degrees 2 x 10, (no weight); L shoulder IR from vertical to stomach with light manual resistance 2 x 10, no pain reported; L shoulder ER from stomach to vertical with light manual resistance 2 x 10, no pain reported;  R elbow manually resisted flexion 2 x 10; R elbow manually resisted extension 2 x 10; Seated chest press with cane and manual resistance from therapist x 10;   PATIENT EDUCATION:  Education details:  Pt educated throughout session about proper posture and technique with exercises. Improved  exercise technique, movement at target joints, use of target muscles after min to mod verbal, visual, tactile cues. Person educated: Patient Education method: Explanation, verbal cues Education comprehension: verbalized understanding and returned demonstration   HOME EXERCISE PROGRAM: Access Code: [email or web address] URL: [email or web address] Date: 03/28/2022 Prepared by: Ria Comment  Exercises - Wrist Flexion AROM  - 1 x daily - 7 x weekly - 2 sets - 10 reps - 3s hold - Wrist Extension AROM  - 1 x daily - 7 x weekly - 2 sets - 10 reps - 3s hold - Seated Scapular Retraction  - 1 x daily - 7 x weekly - 2 sets - 10 reps - 3s hold - Standing Elbow Flexion Extension AROM  - 1 x daily - 7  x weekly - 2 sets - 10 reps - 3s hold - Standing Shoulder Flexion to 90 Degrees  - 1 x daily - 7 x weekly - 2 sets - 10 reps - 3s hold - Shoulder Abduction - Thumbs Up  - 1 x daily - 7 x weekly - 2 sets - 10 reps - 3s hold   ASSESSMENT:  CLINICAL IMPRESSION: Pt demonstrates excellent motivation during session today. R shoulder continues to progress well however considerable weakness and pain noted in L shoulder abduction. Strength appears slightly better today. More L shoulder pain reported as the L shoulder moves farther into horizontal abduction. Pt encouraged to continue HEP and follow-up as scheduled. Plan to progress LUE per protocol as pt is able to tolerate in addition to progressing resistance with RUE strengthening against gravity. He will benefit from skilled PT to address deficits in range of motion and strength in order to improve overall function at home and with leisure activities such as caring for his property.  REHAB POTENTIAL: Good  CLINICAL DECISION MAKING: Evolving/moderate complexity  EVALUATION COMPLEXITY: Moderate   GOALS: Goals reviewed with patient? Yes  SHORT TERM GOALS: Target date: 05/09/2022   Pt will be independent with HEP to improve strength and decrease right  shoulder pain to improve pain-free function at home and work. Baseline:  Goal status: ONGOING   LONG TERM GOALS: Target date: 06/20/22;  Pt will increase L shoulder FOTO to at least 51 to demonstrate significant improvement in function at home and with leisure activities related to L shoulder. Baseline: 01/03/22: To be completed; 01/24/22: 4; 02/28/22: 59; 04/11/22: 55; Goal status: ACHIEVED  2.  Pt will decrease worst L shoulder pain by at least 3 points on the NPRS in order to demonstrate clinically significant reduction in R shoulder pain. Baseline: 01/03/22: worst: 10/10; 02/28/22: 10/10; 04/11/22: 4/10; Goal status: ACHIEVED  3.  Pt will decrease quick DASH score by at least 8% in order to demonstrate clinically significant reduction in disability related to L shoulder pain        Baseline: 01/03/22: 47.7%; 02/28/22: 65.91%; 04/11/22: 56.8%; Goal status: ONGOING  4. Pt will demonstrate L shoulder flexion, abduction, IR, and ER strength of at least 4/5 in order to demonstrate improvement in strength and function       Baseline: 01/03/22: Unable to test per protocol; 02/28/22: At least 3/5 but unable to fully test due to protocol; 04/11/22: flexion: 4/5, abduction: 4-/5 (IR and ER at least 3/5 but not tested); Goal status: PARTIALLY MET  PLAN: PT FREQUENCY: 1-2x/week  PT DURATION: 12 weeks  PLANNED INTERVENTIONS: Therapeutic exercises, Therapeutic activity, Neuromuscular re-education, Balance training, Gait training, Patient/Family education, Joint manipulation, Joint mobilization, Vestibular training, Canalith repositioning, Aquatic Therapy, Dry Needling, Electrical stimulation, Spinal manipulation, Spinal mobilization, Cryotherapy, Moist heat, Traction, Ultrasound, Ionotophoresis 4mg /ml Dexamethasone, and Manual therapy  PLAN FOR NEXT SESSION: Progress L shoulder ROM and strength per protocol, continue R shoulder strengthening   Sharalyn Ink Teller [place] PT, DPT, GCS  Physical Therapist- [facility]   [facility]

## 2022-05-26 ENCOUNTER — Telehealth: Payer: Self-pay | Admitting: Cardiovascular Disease

## 2022-05-26 DIAGNOSIS — I4819 Other persistent atrial fibrillation: Secondary | ICD-10-CM

## 2022-05-26 DIAGNOSIS — Z01812 Encounter for preprocedural laboratory examination: Secondary | ICD-10-CM

## 2022-05-26 NOTE — Telephone Encounter (Signed)
I called and spoke with the patient.  05/02/22- Seen by Dr. Mariah Milling. He was advised at this visit to stop amiodarone  05/04/22- seen by Dr. Graciela Husbands: He was advised to: - Resume amiodarone at 400 mg BID x 2 weeks, then resume amiodarone at 200 mg QD  05/19/22-  He had a DCCV with Dr. Mariah Milling: Cardioverted 5 time(s).   Cardioverted at  150, 200J, 200 J with manual compression Pads placed anterior lateral, 200 J, 200 J with compression.  Synchronized biphasic Unable to convert to normal sinus rhythm     Evaluation: Findings: Post procedure EKG shows: Atrial fibrillation Complications: None Patient did tolerate procedure well.   The patient called today stating that he/ his wife were told at discharge on 05/19/22 to continue all current medications.  The patient called today to inquire what dose of amiodarone he should be on. I advised the patient that he could cut his amiodarone to 200 mg once daily.  He is aware I will forward to Dr. Graciela Husbands to review/ for further recommendations as the amiodarone may need to be stopped and considered a failed drug.   The patient will continue with Amiodarone 200 mg once daily until we can call him back with Dr. Odessa Fleming recommendations.  He voices understanding and is agreeable.   Pending appts: 06/02/22- Echocardiogram 07/04/22- Dr. Graciela Husbands in office

## 2022-05-26 NOTE — Telephone Encounter (Signed)
Agree; thx 

## 2022-05-26 NOTE — Telephone Encounter (Signed)
Pt c/o medication issue:  1. Name of Medication:   amiodarone (PACERONE) 200 MG tablet   2. How are you currently taking this medication (dosage and times per day)?   As prescribed  3. Are you having a reaction (difficulty breathing--STAT)?   4. What is your medication issue?   Patient stated that he has been doubling up on this medication and wants to know if he should continue the current dosage.

## 2022-05-26 NOTE — Telephone Encounter (Signed)
Pt to be scheduled for DCCV with 2 defibs at [facility]

## 2022-05-30 ENCOUNTER — Ambulatory Visit: Payer: Medicare Other

## 2022-05-30 DIAGNOSIS — M25512 Pain in left shoulder: Secondary | ICD-10-CM | POA: Diagnosis not present

## 2022-05-30 DIAGNOSIS — G8929 Other chronic pain: Secondary | ICD-10-CM | POA: Diagnosis not present

## 2022-05-30 DIAGNOSIS — M25511 Pain in right shoulder: Secondary | ICD-10-CM | POA: Diagnosis not present

## 2022-05-30 DIAGNOSIS — M6281 Muscle weakness (generalized): Secondary | ICD-10-CM | POA: Diagnosis not present

## 2022-05-30 NOTE — Addendum Note (Signed)
Addended by: Alois Cliche on: 05/30/2022 04:40 PM   Modules accepted: Orders

## 2022-05-30 NOTE — Telephone Encounter (Signed)
Spoke with pt re: need for DCCV per Dr Graciela Husbands.  Pt agreeable to 06/13/2022.  Pt advised instructions will be completed and placed on MyChart for review.  Pt verbalizes understanding and thanked Charity fundraiser for the call.

## 2022-05-30 NOTE — Therapy (Signed)
OUTPATIENT PHYSICAL THERAPY SHOULDER TREATMENT  Patient Name: [name] MRN: [id number] DOB:[birth year], 67 y.o., male Today's Date: 05/30/2022   PT End of Session - 05/30/22 1338     Visit Number 28    Number of Visits 49    Date for PT Re-Evaluation 06/20/22    Authorization Type [employer] Medicare    Authorization Time Period [employer] medicare 2024  [id number] on [place]    PT Start Time 1348    PT Stop Time 1430    PT Time Calculation (min) 42 min    Activity Tolerance Patient tolerated treatment well    Behavior During Therapy WFL for tasks assessed/performed            Past Medical History:  Diagnosis Date   Anxiety    Aortic atherosclerosis (HCC)    Cardiomyopathy (in setting of Afib)    a.) TTE 12/26/2013: EF 45-50%, mild ant and antsept HK. mild MR. Mod dil LA. nl RV fxn. Rhythm was Afib; b.) TTE 07/04/2019: EF 55%, mid-apical anteroseptal HK, mild MAC, mild Ao sclerosis, G2DD, RVSP 45.3   Chronic pain syndrome    a.) followed by pain management   Chronic, continuous use of opioids    a.) hydrocodone/APAP 7.5/325 mg; followed by pain management   Coronary artery disease 11/06/2014   a.) cCTA 11/06/2014: Ca score 224 (all in pLAD) -- 74th percentile for age/sex matched control   Diverticulosis    DJD (degenerative joint disease) of knee    History of hiatal hernia    History of kidney stones 2012   Hyperlipidemia    Hyperplastic colon polyp    Hypertension    Inguinal hernia    left   Internal hemorrhoids    Intervertebral disc disorder with radiculopathy of lumbosacral region    Long term current use FULL DOSE (325 mg) aspirin    Long term current use of amiodarone    Long term current use of antithrombotics/antiplatelets    a.) rivaroxaban   Lower extremity edema    Mixed hyperlipidemia    Myalgia due to statin    Osteoarthritis    PAF (paroxysmal atrial fibrillation) (HCC)    a.) CHA2DS2VASc = 4 (age, HTN, CHF, vascular disease history);  b.) s/p DCCV 03/13/2014  (200 J x1); c.) s/p DCCV 05/15/2019 (150 J x 1, 200 J x2); d.) rate/rhythm maintained on oral amiodarone + metoprolol succinate; chronically anticoagulated with rivaroxaban   Pernicious anemia    Prostate cancer (HCC)    Pulmonary nodule, right    a. 10/2014 Cardiac CTA: 7mm RLL nodule; b. 04/2015 CT Chest: stable 7mm RLL nodule. No new nodules; 02/2017 CTA Chest: stable, benign, 7mm RLL pulm nodule.   Sleep difficulties    a.) takes melatonin + trazodone PRN   SVT (supraventricular tachycardia)    Tubular adenoma of colon    Past Surgical History:  Procedure Laterality Date   BICEPT TENODESIS Right 09/27/2021   Procedure: Right reverse shoulder arthroplasty, biceps tenodesis;  Surgeon: Signa Kell, MD;  Location: [facility];  Service: Orthopedics;  Laterality: Right;   CARDIOVERSION N/A 05/15/2019   Procedure: CARDIOVERSION;  Surgeon: Antonieta Iba, MD;  Location: [facility];  Service: Cardiovascular;  Laterality: N/A;   CARDIOVERSION N/A 03/13/2014   Procedure: CARDIOVERSION; Location: [facility]; Surgeon: [name], MD   CARDIOVERSION N/A 03/24/2022   Procedure: CARDIOVERSION;  Surgeon: Antonieta Iba, MD;  Location: [facility];  Service: Cardiovascular;  Laterality: N/A;   CARDIOVERSION N/A 05/19/2022   Procedure:  CARDIOVERSION;  Surgeon: Antonieta Iba, MD;  Location: [facility];  Service: Cardiovascular;  Laterality: N/A;   CARPAL TUNNEL RELEASE Right 12/23/2014   Procedure: CARPAL TUNNEL RELEASE;  Surgeon: Deeann Saint, MD;  Location: [facility];  Service: Orthopedics;  Laterality: Right;   CARPAL TUNNEL RELEASE Left 01/06/2015   Procedure: CARPAL TUNNEL RELEASE;  Surgeon: Deeann Saint, MD;  Location: [facility];  Service: Orthopedics;  Laterality: Left;   COLONOSCOPY WITH PROPOFOL N/A 10/08/2017   Procedure: COLONOSCOPY WITH PROPOFOL;  Surgeon: Toney Reil, MD;  Location: [facility];  Service: Gastroenterology;  Laterality: N/A;   GASTRIC BYPASS  01/17/2000   KNEE SURGERY  Right    knee trauma x3   prostate seeding     REVERSE SHOULDER ARTHROPLASTY Right 09/27/2021   Procedure: Right reverse shoulder arthroplasty, biceps tenodesis;  Surgeon: Signa Kell, MD;  Location: [facility];  Service: Orthopedics;  Laterality: Right;   SHOULDER ARTHROSCOPY WITH ROTATOR CUFF REPAIR AND OPEN BICEPS TENODESIS Left 12/27/2021   Procedure: Left shoulder arthroscopic cuff repair (supraspinatus and subscapularis) with Regeneten Patch application;  Surgeon: Signa Kell, MD;  Location: [facility];  Service: Orthopedics;  Laterality: Left;   Patient Active Problem List   Diagnosis Date Noted   Symptomatic anemia 04/26/2022   Chronic pain of both knees 03/30/2022   Bilateral primary osteoarthritis of knee 03/30/2022   Shortness of breath 03/24/2022   Vitamin D deficiency 02/24/2022   S/p reverse total shoulder arthroplasty 09/27/2021   Lesion of bone of lumbosacral spine (L5) 05/12/2021   Localized osteoarthritis of shoulder regions, bilateral 03/29/2021   Chronic pain of both shoulders 03/29/2021   Drug-induced myopathy 02/21/2021   Trigger finger of right hand 11/15/2020   Prostate cancer (HCC) 07/26/2020   Insomnia 08/01/2019   Primary osteoarthritis of both wrists 02/17/2019   Trigger middle finger of left hand 02/17/2019   Chronic pain syndrome 02/17/2019   Chronic radicular lumbar pain 10/08/2018   Lumbar radiculopathy 10/08/2018   Lumbar degenerative disc disease 10/08/2018   Lumbar facet arthropathy 10/08/2018   Lumbar facet joint syndrome 10/08/2018   Intervertebral disc disorder with radiculopathy of lumbosacral region    Osteoarthritis of knee 11/30/2017   Osteoarthritis of wrist 11/30/2017   Pernicious anemia 05/22/2016   Persistent atrial fibrillation (HCC) 12/20/2015   Carpal tunnel syndrome 11/16/2014   DJD (degenerative joint disease) of knee 10/27/2014   Bilateral carpal tunnel syndrome 10/27/2014   Morbid obesity with BMI of 50.0-59.9, adult (HCC)  10/27/2014   Mixed hyperlipidemia 10/27/2014   H/O gastric bypass 03/20/2014   Hyperkalemia 03/20/2014   History of prostate cancer 12/26/2013   Essential hypertension 12/26/2013   Encounter for anticoagulation discussion and counseling 12/26/2013   PCP: Smitty Cords, DO  REFERRING PROVIDER: Signa Kell, MD  REFERRING DIAGNOSIS: Incomplete tear of left rotator cuff, unspecified whether traumatic M75.112   THERAPY DIAG: Chronic left shoulder pain  Chronic right shoulder pain  Muscle weakness (generalized)  RATIONALE FOR EVALUATION AND TREATMENT: Rehabilitation  ONSET DATE: Surgery 12/27/21, Chronic L shoulder pain for multiple years  FOLLOW UP APPT WITH PROVIDER: Yes    FROM INITIAL EVALUATION SUBJECTIVE:  Chief Complaint: S/p L shoulder RTC repair, biceps tenodesis, and shoulder debridement  Pertinent History Pt with history of chronic bilateral shoulder pain. He underwent right reverse TSR and right biceps tenodesis on 09/27/21. He experienced post-operative instability in R shoulder however it improved and he was able to avoid a revision. On 12/27/21 he underwent left arthroscopic rotator cuff repair (subscapularis (full-thickness), supraspinatus (partial-thickness) side-to-side repair + Regeneten patch), left arthroscopic biceps tenodesis, and left extensive debridement of shoulder (glenohumeral and subacromial spaces). Per surgical note other intraoperative findings include Type 2 SLAP tear, degenerative anterior labral tearing, grade 2 degenerative changes to articular cartilage of humeral head, and significant posterior capsule synovitis. No significant post-operative complications. He arrives without abduction pillow and states that Dr. Allena Katz advised him he doesn't have to wear the  abduction pillow. He got behind on his pain medication today and states that today his shoulder has been the most painful he has experienced since surgery. He also noticed swelling in L lateral upper arm this morning. He did take his pain medication at 14:00, before PT evaluation.  He has a history of chronic back pain and underwent a lumbar facet, medial branch radiofrequency ablation on 01/02/22. At the time of the evaluation pt has not yet appreciated improvement in his back pain from this procedure. He has a past medical history of Cardiomyopathy (in setting of Afib), DJD (degenerative joint disease) of knee, History of kidney stones, Intervertebral disc disorder with radiculopathy of lumbosacral region, Kidney stone (2012), Lower extremity edema, PAF (paroxysmal atrial fibrillation) (HCC), Pernicious anemia, Prostate cancer (HCC), Pulmonary nodule, right, and SVT (supraventricular tachycardia) (HCC). He also  has a past surgical history that includes Knee surgery; Gastric bypass (2002); prostate seeding; Carpal tunnel release (Right, 12/23/2014); Carpal tunnel release (Left, 01/06/2015); Colonoscopy with propofol (N/A, 10/08/2017); and Cardioversion (N/A, 05/15/2019).   Pain:  Pain Intensity: Present: 5/10, Best: 3/10, Worst: 10/10 Pain location: Anterior L shoulder Pain Quality: aching and throbbing Radiating: No  Numbness/Tingling: No Focal Weakness: Yes History of prior shoulder or neck/shoulder injury, pain, surgery, or therapy: Yes, chronic bilateral shoulder pain with R TSR. 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 No AROM or WB through LUE (per protocol), Passive elevation and ER in staged ROM to begin at week 4 (elevation 90 degrees weeks 4-6 with no ER; 0-120 weeks 6-8 with ER 0-30 degrees). No pulleys or cane until after 6 weeks, recommend no driving x 6-8 weeks.   Weight Bearing Restrictions: Yes No WB through LUE for at least 6 weeks  Living Environment Lives with: lives with their spouse Lives in: House/apartment  Patient Goals: Improve L shoulder strength and pain. Pt would like to be able to play with/lift his grandson as well as take care of his property.  OBJECTIVE:   Patient Surveys  FOTO: To be completed QuickDASH: 47.7%  Cognition Patient is oriented to person, place, and time.  Recent memory is intact.  Remote memory is intact.  Attention span and concentration are intact.  Expressive speech is intact.  Patient's fund of knowledge is within normal limits for educational level.  Gross Musculoskeletal Assessment Tremor: None Bulk: Mild swelling noted in L lateral upper arm. Pt denies chills, fevers, redness, or dischrage from inicisions Tone: Normal  Posture Forward head and rounded shoulders  AROM Full L wrist AROM flexion, extension, radial deviation, and ulnar deviation. MCP flexion and extension WNL. Unable to assess L shoulder and elbow AROM per protocol  PROM Full L elbow PROM flexion and extension however during first attempt at slow passive extension pt reports sudden pain in L shoulder which eventually improve after a couple minutes of rest. Full L elbow PROM pronation/supination.   LE MMT: Full L wrist strength for flexion, extension, radial deviation, and ulnar deviation. L grip strength is intact and strong. Deferred testing of L elbow and shoulder strength per protocol  Sensation Grossly intact to light touch throughout LUE as  determined by testing dermatomes C2-T2. Proprioception and hot/cold testing deferred on this date.  Palpation Pt is generally tender to light palpation around anterior, lateral, and posterior L shoulder;    TODAY'S TREATMENT    SUBJECTIVE: Pt reports his shoulders are doing alright today. He continues with significant intermittent L shoulder pain especially when lifting his arm directly out to the side (abduction) however it has been better over the last few days. He has been able to sleep for the last few nights without waking up due to the pain.   PAIN: No resting shoulder pain reported upon arrival   Ther-ex  All exercises performed sitting fully upright today unless otherwise specified: R shoulder flexion from neutral to 130 degrees with 4# dumbbell (DB) 2 x 10 R shoulder scaption from neutral to around 110 degrees with 4# DB 2 x 10;  R shoulder internal rotation from forearm vertical to stomach with manual resistance from therapist 2 x 10; R shoulder external rotation from stomach to forearm vertical with manual resistance from therapist 2 x 10; R elbow manually resisted flexion 2 x 10; R elbow manually resisted extension 2 x 10; L shoulder AROM flexion with straight elbow from neutral to around 130 degrees 2 x 10, (no weight); L shoulder AROM scaption with straight elbow from neutral to around 110 degrees 2 x 10, (no weight); Overhead L shoulder press 2 x 10; L shoulder IR from vertical to stomach with light manual resistance 2 x 10, no pain reported; L shoulder ER from stomach to vertical with light manual resistance 2 x 10, no pain reported;  L elbow manually resisted flexion 2 x 10; L elbow manually resisted extension 2 x 10; Seated chest press with cane and manual resistance from therapist 2 x 10; Seated chest press with cane and manual resistance from therapist 2 x 10;   PATIENT EDUCATION:  Education details:  Pt educated throughout session about proper posture and  technique with exercises. Improved exercise technique, movement at target joints, use of target muscles after min to mod verbal, visual, tactile cues. Person educated: Patient Education method: Explanation, verbal cues Education comprehension: verbalized understanding and returned demonstration   HOME EXERCISE PROGRAM: Access Code: [email or web address] URL: [email or web address] Date: 03/28/2022 Prepared by: Ria Comment  Exercises - Wrist Flexion AROM  - 1 x daily - 7 x weekly - 2 sets - 10 reps - 3s hold - Wrist Extension AROM  - 1 x daily - 7 x weekly - 2 sets - 10 reps - 3s hold - Seated Scapular Retraction  - 1 x daily - 7 x weekly - 2 sets - 10 reps - 3s hold - Standing  Elbow Flexion Extension AROM  - 1 x daily - 7 x weekly - 2 sets - 10 reps - 3s hold - Standing Shoulder Flexion to 90 Degrees  - 1 x daily - 7 x weekly - 2 sets - 10 reps - 3s hold - Shoulder Abduction - Thumbs Up  - 1 x daily - 7 x weekly - 2 sets - 10 reps - 3s hold   ASSESSMENT:  CLINICAL IMPRESSION: Pt demonstrates excellent motivation during session today. R shoulder continues to progress well and less pain noted with L shoulder abduction today compared to prior sessions. Overall his pain has improved slightly since the last therapy session. Pt encouraged to continue HEP and follow-up as scheduled. Plan to progress LUE per protocol as pt is able to tolerate in addition to progressing resistance with RUE strengthening against gravity. He will benefit from skilled PT to address deficits in range of motion and strength in order to improve overall function at home and with leisure activities such as caring for his property.  REHAB POTENTIAL: Good  CLINICAL DECISION MAKING: Evolving/moderate complexity  EVALUATION COMPLEXITY: Moderate   GOALS: Goals reviewed with patient? Yes  SHORT TERM GOALS: Target date: 05/09/2022   Pt will be independent with HEP to improve strength and decrease right shoulder pain  to improve pain-free function at home and work. Baseline:  Goal status: ONGOING   LONG TERM GOALS: Target date: 06/20/22;  Pt will increase L shoulder FOTO to at least 51 to demonstrate significant improvement in function at home and with leisure activities related to L shoulder. Baseline: 01/03/22: To be completed; 01/24/22: 4; 02/28/22: 59; 04/11/22: 55; Goal status: ACHIEVED  2.  Pt will decrease worst L shoulder pain by at least 3 points on the NPRS in order to demonstrate clinically significant reduction in R shoulder pain. Baseline: 01/03/22: worst: 10/10; 02/28/22: 10/10; 04/11/22: 4/10; Goal status: ACHIEVED  3.  Pt will decrease quick DASH score by at least 8% in order to demonstrate clinically significant reduction in disability related to L shoulder pain        Baseline: 01/03/22: 47.7%; 02/28/22: 65.91%; 04/11/22: 56.8%; Goal status: ONGOING  4. Pt will demonstrate L shoulder flexion, abduction, IR, and ER strength of at least 4/5 in order to demonstrate improvement in strength and function       Baseline: 01/03/22: Unable to test per protocol; 02/28/22: At least 3/5 but unable to fully test due to protocol; 04/11/22: flexion: 4/5, abduction: 4-/5 (IR and ER at least 3/5 but not tested); Goal status: PARTIALLY MET  PLAN: PT FREQUENCY: 1-2x/week  PT DURATION: 12 weeks  PLANNED INTERVENTIONS: Therapeutic exercises, Therapeutic activity, Neuromuscular re-education, Balance training, Gait training, Patient/Family education, Joint manipulation, Joint mobilization, Vestibular training, Canalith repositioning, Aquatic Therapy, Dry Needling, Electrical stimulation, Spinal manipulation, Spinal mobilization, Cryotherapy, Moist heat, Traction, Ultrasound, Ionotophoresis 4mg /ml Dexamethasone, and Manual therapy  PLAN FOR NEXT SESSION: Progress L shoulder ROM and strength per protocol, continue R shoulder strengthening   Sharalyn Ink Rosilyn Coachman PT, DPT, GCS  Physical Therapist- [facility]

## 2022-05-31 ENCOUNTER — Encounter: Payer: Self-pay | Admitting: Gastroenterology

## 2022-06-01 ENCOUNTER — Ambulatory Visit: Payer: Medicare Other

## 2022-06-01 ENCOUNTER — Telehealth: Payer: Self-pay | Admitting: Internal Medicine

## 2022-06-01 ENCOUNTER — Telehealth: Payer: Self-pay

## 2022-06-01 DIAGNOSIS — M6281 Muscle weakness (generalized): Secondary | ICD-10-CM

## 2022-06-01 DIAGNOSIS — M25511 Pain in right shoulder: Secondary | ICD-10-CM | POA: Diagnosis not present

## 2022-06-01 DIAGNOSIS — M25512 Pain in left shoulder: Secondary | ICD-10-CM | POA: Diagnosis not present

## 2022-06-01 DIAGNOSIS — G8929 Other chronic pain: Secondary | ICD-10-CM | POA: Diagnosis not present

## 2022-06-01 NOTE — Telephone Encounter (Signed)
[phone] Pt has a question about  taking medication before his procedure.

## 2022-06-01 NOTE — Telephone Encounter (Signed)
Informed patient of this information he verbalized understanding and was appreciated of our help

## 2022-06-01 NOTE — Therapy (Signed)
OUTPATIENT PHYSICAL THERAPY SHOULDER TREATMENT  Patient Name: DERICO MCCREA MRN: [id number] DOB:[birth year], 67 y.o., male Today's Date: 06/02/2022   PT End of Session - 06/01/22 1411     Visit Number 29    Number of Visits 49    Date for PT Re-Evaluation 06/20/22    Authorization Type [employer] Medicare    Authorization Time Period [employer] medicare 2024  [id number] on [place]    PT Start Time 1400    PT Stop Time 1435    PT Time Calculation (min) 35 min    Activity Tolerance Patient tolerated treatment well    Behavior During Therapy WFL for tasks assessed/performed            Past Medical History:  Diagnosis Date   Anxiety    Aortic atherosclerosis (HCC)    Cardiomyopathy (in setting of Afib)    a.) TTE 12/26/2013: EF 45-50%, mild ant and antsept HK. mild MR. Mod dil LA. nl RV fxn. Rhythm was Afib; b.) TTE 07/04/2019: EF 55%, mid-apical anteroseptal HK, mild MAC, mild Ao sclerosis, G2DD, RVSP 45.3   Chronic pain syndrome    a.) followed by pain management   Chronic, continuous use of opioids    a.) hydrocodone/APAP 7.5/325 mg; followed by pain management   Coronary artery disease 11/06/2014   a.) cCTA 11/06/2014: Ca score 224 (all in pLAD) -- 74th percentile for age/sex matched control   Diverticulosis    DJD (degenerative joint disease) of knee    History of hiatal hernia    History of kidney stones 2012   Hyperlipidemia    Hyperplastic colon polyp    Hypertension    Inguinal hernia    left   Internal hemorrhoids    Intervertebral disc disorder with radiculopathy of lumbosacral region    Long term current use FULL DOSE (325 mg) aspirin    Long term current use of amiodarone    Long term current use of antithrombotics/antiplatelets    a.) rivaroxaban   Lower extremity edema    Mixed hyperlipidemia    Myalgia due to statin    Osteoarthritis    PAF (paroxysmal atrial fibrillation) (HCC)    a.) CHA2DS2VASc = 4 (age, HTN, CHF, vascular disease history);  b.) s/p DCCV 03/13/2014  (200 J x1); c.) s/p DCCV 05/15/2019 (150 J x 1, 200 J x2); d.) rate/rhythm maintained on oral amiodarone + metoprolol succinate; chronically anticoagulated with rivaroxaban   Pernicious anemia    Prostate cancer (HCC)    Pulmonary nodule, right    a. 10/2014 Cardiac CTA: 7mm RLL nodule; b. 04/2015 CT Chest: stable 7mm RLL nodule. No new nodules; 02/2017 CTA Chest: stable, benign, 7mm RLL pulm nodule.   Sleep difficulties    a.) takes melatonin + trazodone PRN   SVT (supraventricular tachycardia)    Tubular adenoma of colon    Past Surgical History:  Procedure Laterality Date   BICEPT TENODESIS Right 09/27/2021   Procedure: Right reverse shoulder arthroplasty, biceps tenodesis;  Surgeon: Signa Kell, MD;  Location: [facility];  Service: Orthopedics;  Laterality: Right;   CARDIOVERSION N/A 05/15/2019   Procedure: CARDIOVERSION;  Surgeon: Antonieta Iba, MD;  Location: [facility];  Service: Cardiovascular;  Laterality: N/A;   CARDIOVERSION N/A 03/13/2014   Procedure: CARDIOVERSION; Location: [facility]; Surgeon: [name], MD   CARDIOVERSION N/A 03/24/2022   Procedure: CARDIOVERSION;  Surgeon: Antonieta Iba, MD;  Location: [facility];  Service: Cardiovascular;  Laterality: N/A;   CARDIOVERSION N/A 05/19/2022   Procedure:  CARDIOVERSION;  Surgeon: Antonieta Iba, MD;  Location: [facility];  Service: Cardiovascular;  Laterality: N/A;   CARPAL TUNNEL RELEASE Right 12/23/2014   Procedure: CARPAL TUNNEL RELEASE;  Surgeon: Deeann Saint, MD;  Location: [facility];  Service: Orthopedics;  Laterality: Right;   CARPAL TUNNEL RELEASE Left 01/06/2015   Procedure: CARPAL TUNNEL RELEASE;  Surgeon: Deeann Saint, MD;  Location: [facility];  Service: Orthopedics;  Laterality: Left;   COLONOSCOPY WITH PROPOFOL N/A 10/08/2017   Procedure: COLONOSCOPY WITH PROPOFOL;  Surgeon: Toney Reil, MD;  Location: [facility];  Service: Gastroenterology;  Laterality: N/A;   GASTRIC BYPASS  01/17/2000   KNEE SURGERY  Right    knee trauma x3   prostate seeding     REVERSE SHOULDER ARTHROPLASTY Right 09/27/2021   Procedure: Right reverse shoulder arthroplasty, biceps tenodesis;  Surgeon: Signa Kell, MD;  Location: [facility];  Service: Orthopedics;  Laterality: Right;   SHOULDER ARTHROSCOPY WITH ROTATOR CUFF REPAIR AND OPEN BICEPS TENODESIS Left 12/27/2021   Procedure: Left shoulder arthroscopic cuff repair (supraspinatus and subscapularis) with Regeneten Patch application;  Surgeon: Signa Kell, MD;  Location: [facility];  Service: Orthopedics;  Laterality: Left;   Patient Active Problem List   Diagnosis Date Noted   Symptomatic anemia 04/26/2022   Chronic pain of both knees 03/30/2022   Bilateral primary osteoarthritis of knee 03/30/2022   Shortness of breath 03/24/2022   Vitamin D deficiency 02/24/2022   S/p reverse total shoulder arthroplasty 09/27/2021   Lesion of bone of lumbosacral spine (L5) 05/12/2021   Localized osteoarthritis of shoulder regions, bilateral 03/29/2021   Chronic pain of both shoulders 03/29/2021   Drug-induced myopathy 02/21/2021   Trigger finger of right hand 11/15/2020   Prostate cancer (HCC) 07/26/2020   Insomnia 08/01/2019   Primary osteoarthritis of both wrists 02/17/2019   Trigger middle finger of left hand 02/17/2019   Chronic pain syndrome 02/17/2019   Chronic radicular lumbar pain 10/08/2018   Lumbar radiculopathy 10/08/2018   Lumbar degenerative disc disease 10/08/2018   Lumbar facet arthropathy 10/08/2018   Lumbar facet joint syndrome 10/08/2018   Intervertebral disc disorder with radiculopathy of lumbosacral region    Osteoarthritis of knee 11/30/2017   Osteoarthritis of wrist 11/30/2017   Pernicious anemia 05/22/2016   Persistent atrial fibrillation (HCC) 12/20/2015   Carpal tunnel syndrome 11/16/2014   DJD (degenerative joint disease) of knee 10/27/2014   Bilateral carpal tunnel syndrome 10/27/2014   Morbid obesity with BMI of 50.0-59.9, adult (HCC)  10/27/2014   Mixed hyperlipidemia 10/27/2014   H/O gastric bypass 03/20/2014   Hyperkalemia 03/20/2014   History of prostate cancer 12/26/2013   Essential hypertension 12/26/2013   Encounter for anticoagulation discussion and counseling 12/26/2013   PCP: Smitty Cords, DO  REFERRING PROVIDER: Signa Kell, MD  REFERRING DIAGNOSIS: Incomplete tear of left rotator cuff, unspecified whether traumatic M75.112   THERAPY DIAG: Chronic left shoulder pain  Chronic right shoulder pain  Muscle weakness (generalized)  RATIONALE FOR EVALUATION AND TREATMENT: Rehabilitation  ONSET DATE: Surgery 12/27/21, Chronic L shoulder pain for multiple years  FOLLOW UP APPT WITH PROVIDER: Yes    FROM INITIAL EVALUATION SUBJECTIVE:  Chief Complaint: S/p L shoulder RTC repair, biceps tenodesis, and shoulder debridement  Pertinent History Pt with history of chronic bilateral shoulder pain. He underwent right reverse TSR and right biceps tenodesis on 09/27/21. He experienced post-operative instability in R shoulder however it improved and he was able to avoid a revision. On 12/27/21 he underwent left arthroscopic rotator cuff repair (subscapularis (full-thickness), supraspinatus (partial-thickness) side-to-side repair + Regeneten patch), left arthroscopic biceps tenodesis, and left extensive debridement of shoulder (glenohumeral and subacromial spaces). Per surgical note other intraoperative findings include Type 2 SLAP tear, degenerative anterior labral tearing, grade 2 degenerative changes to articular cartilage of humeral head, and significant posterior capsule synovitis. No significant post-operative complications. He arrives without abduction pillow and states that Dr. Allena Katz advised him he doesn't have to wear the  abduction pillow. He got behind on his pain medication today and states that today his shoulder has been the most painful he has experienced since surgery. He also noticed swelling in L lateral upper arm this morning. He did take his pain medication at 14:00, before PT evaluation.  He has a history of chronic back pain and underwent a lumbar facet, medial branch radiofrequency ablation on 01/02/22. At the time of the evaluation pt has not yet appreciated improvement in his back pain from this procedure. He has a past medical history of Cardiomyopathy (in setting of Afib), DJD (degenerative joint disease) of knee, History of kidney stones, Intervertebral disc disorder with radiculopathy of lumbosacral region, Kidney stone (2012), Lower extremity edema, PAF (paroxysmal atrial fibrillation) (HCC), Pernicious anemia, Prostate cancer (HCC), Pulmonary nodule, right, and SVT (supraventricular tachycardia) (HCC). He also  has a past surgical history that includes Knee surgery; Gastric bypass (2002); prostate seeding; Carpal tunnel release (Right, 12/23/2014); Carpal tunnel release (Left, 01/06/2015); Colonoscopy with propofol (N/A, 10/08/2017); and Cardioversion (N/A, 05/15/2019).   Pain:  Pain Intensity: Present: 5/10, Best: 3/10, Worst: 10/10 Pain location: Anterior L shoulder Pain Quality: aching and throbbing Radiating: No  Numbness/Tingling: No Focal Weakness: Yes History of prior shoulder or neck/shoulder injury, pain, surgery, or therapy: Yes, chronic bilateral shoulder pain with R TSR. 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 No AROM or WB through LUE (per protocol), Passive elevation and ER in staged ROM to begin at week 4 (elevation 90 degrees weeks 4-6 with no ER; 0-120 weeks 6-8 with ER 0-30 degrees). No pulleys or cane until after 6 weeks, recommend no driving x 6-8 weeks.   Weight Bearing Restrictions: Yes No WB through LUE for at least 6 weeks  Living Environment Lives with: lives with their spouse Lives in: House/apartment  Patient Goals: Improve L shoulder strength and pain. Pt would like to be able to play with/lift his grandson as well as take care of his property.  OBJECTIVE:   Patient Surveys  FOTO: To be completed QuickDASH: 47.7%  Cognition Patient is oriented to person, place, and time.  Recent memory is intact.  Remote memory is intact.  Attention span and concentration are intact.  Expressive speech is intact.  Patient's fund of knowledge is within normal limits for educational level.  Gross Musculoskeletal Assessment Tremor: None Bulk: Mild swelling noted in L lateral upper arm. Pt denies chills, fevers, redness, or dischrage from inicisions Tone: Normal  Posture Forward head and rounded shoulders  AROM Full L wrist AROM flexion, extension, radial deviation, and ulnar deviation. MCP flexion and extension WNL. Unable to assess L shoulder and elbow AROM per protocol  PROM Full L elbow PROM flexion and extension however during first attempt at slow passive extension pt reports sudden pain in L shoulder which eventually improve after a couple minutes of rest. Full L elbow PROM pronation/supination.   LE MMT: Full L wrist strength for flexion, extension, radial deviation, and ulnar deviation. L grip strength is intact and strong. Deferred testing of L elbow and shoulder strength per protocol  Sensation Grossly intact to light touch throughout LUE as  determined by testing dermatomes C2-T2. Proprioception and hot/cold testing deferred on this date.  Palpation Pt is generally tender to light palpation around anterior, lateral, and posterior L shoulder;    TODAY'S TREATMENT    SUBJECTIVE: Pt reports his shoulders are doing alright today. He continues with intermittent L shoulder pain especially when lifting his arm directly out to the side (abduction) however it has been better over the last few days. He has continued improvement in his sleep.   PAIN: No resting shoulder pain reported upon arrival   Ther-ex  All exercises performed sitting fully upright today unless otherwise specified: R shoulder flexion from neutral to 130 degrees with 4# dumbbell (DB) 2 x 10 R shoulder scaption from neutral to around 110 degrees with 4# DB 2 x 10; R shoulder internal rotation from forearm vertical to stomach with manual resistance from therapist 2 x 10; R shoulder external rotation from stomach to forearm vertical with manual resistance from therapist 2 x 10; R elbow manually resisted flexion 2 x 10; R elbow manually resisted extension 2 x 10; L shoulder AROM flexion with straight elbow from neutral to around 130 degrees with 1# DB 2 x 10; L shoulder AROM scaption with straight elbow from neutral to around 110 degrees with 1# DB 2 x 10; L shoulder IR from vertical to stomach with light manual resistance 2 x 10, no pain reported; L shoulder ER from stomach to vertical with light manual resistance 2 x 10, no pain reported;  L elbow manually resisted flexion 2 x 10; L elbow manually resisted extension 2 x 10; Seated chest press with cane and manual resistance from therapist 2 x 10; Seated chest press with cane and manual resistance from therapist 2 x 10;   PATIENT EDUCATION:  Education details:  Pt educated throughout session about proper posture and technique with exercises. Improved exercise technique, movement at target joints, use of target  muscles after min to mod verbal, visual, tactile cues. Person educated: Patient Education method: Explanation, verbal cues Education comprehension: verbalized understanding and returned demonstration   HOME EXERCISE PROGRAM: Access Code: [email or web address] URL: [email or web address] Date: 03/28/2022 Prepared by: Ria Comment  Exercises - Wrist Flexion AROM  - 1 x daily - 7 x weekly - 2 sets - 10 reps - 3s hold - Wrist Extension AROM  - 1 x daily - 7 x weekly - 2 sets - 10 reps - 3s hold - Seated Scapular Retraction  - 1 x daily - 7 x weekly - 2 sets - 10 reps - 3s hold - Standing Elbow Flexion Extension AROM  - 1 x daily - 7 x weekly - 2 sets - 10  reps - 3s hold - Standing Shoulder Flexion to 90 Degrees  - 1 x daily - 7 x weekly - 2 sets - 10 reps - 3s hold - Shoulder Abduction - Thumbs Up  - 1 x daily - 7 x weekly - 2 sets - 10 reps - 3s hold   ASSESSMENT:  CLINICAL IMPRESSION: Pt demonstrates excellent motivation during session today. R shoulder continues to progress well and once again less pain noted with L shoulder abduction today compared to prior sessions. Progressed L shoulder exercises to 1# dumbbell today. Pt would still like to talk to ortho regarding repeat imaging of L shoulder due to ongoing abduction weakness. Pt encouraged to continue HEP and follow-up as scheduled. Plan to progress LUE per protocol as pt is able to tolerate in addition to progressing resistance with RUE strengthening against gravity. He will need updated outcome measures and a progress note at next visit. He will benefit from skilled PT to address deficits in range of motion and strength in order to improve overall function at home and with leisure activities such as caring for his property.  REHAB POTENTIAL: Good  CLINICAL DECISION MAKING: Evolving/moderate complexity  EVALUATION COMPLEXITY: Moderate   GOALS: Goals reviewed with patient? Yes  SHORT TERM GOALS: Target date: 05/09/2022   Pt  will be independent with HEP to improve strength and decrease right shoulder pain to improve pain-free function at home and work. Baseline:  Goal status: ONGOING   LONG TERM GOALS: Target date: 06/20/22;  Pt will increase L shoulder FOTO to at least 51 to demonstrate significant improvement in function at home and with leisure activities related to L shoulder. Baseline: 01/03/22: To be completed; 01/24/22: 4; 02/28/22: 59; 04/11/22: 55; Goal status: ACHIEVED  2.  Pt will decrease worst L shoulder pain by at least 3 points on the NPRS in order to demonstrate clinically significant reduction in R shoulder pain. Baseline: 01/03/22: worst: 10/10; 02/28/22: 10/10; 04/11/22: 4/10; Goal status: ACHIEVED  3.  Pt will decrease quick DASH score by at least 8% in order to demonstrate clinically significant reduction in disability related to L shoulder pain        Baseline: 01/03/22: 47.7%; 02/28/22: 65.91%; 04/11/22: 56.8%; Goal status: ONGOING  4. Pt will demonstrate L shoulder flexion, abduction, IR, and ER strength of at least 4/5 in order to demonstrate improvement in strength and function       Baseline: 01/03/22: Unable to test per protocol; 02/28/22: At least 3/5 but unable to fully test due to protocol; 04/11/22: flexion: 4/5, abduction: 4-/5 (IR and ER at least 3/5 but not tested); Goal status: PARTIALLY MET  PLAN: PT FREQUENCY: 1-2x/week  PT DURATION: 12 weeks  PLANNED INTERVENTIONS: Therapeutic exercises, Therapeutic activity, Neuromuscular re-education, Balance training, Gait training, Patient/Family education, Joint manipulation, Joint mobilization, Vestibular training, Canalith repositioning, Aquatic Therapy, Dry Needling, Electrical stimulation, Spinal manipulation, Spinal mobilization, Cryotherapy, Moist heat, Traction, Ultrasound, Ionotophoresis 4mg /ml Dexamethasone, and Manual therapy  PLAN FOR NEXT SESSION: Progress L shoulder ROM and strength per protocol, continue R shoulder  strengthening   Sharalyn Ink Jaydn Fincher PT, DPT, GCS  Physical Therapist- [place]  [facility]

## 2022-06-01 NOTE — Telephone Encounter (Signed)
Pt calling back about holding his medication. He states he needs some direction on what to do.   Per GI notes, pt does not want to r/s his colonscopy and would like cardioversion pushed back. Please advise.

## 2022-06-01 NOTE — Telephone Encounter (Signed)
I can certainly perform upper endoscopy and colonoscopy on Xarelto.  I can do biopsy and remove any polyps that are less than 1 cm on blood thinner.  However, I cannot remove large polyps while he is on blood thinner.  If cardiology clears him to undergo GI procedures after cardioversion while continuing Xarelto, he can proceed with it.  He needs to understand that, he will have to come back for repeat colonoscopy if large polyps are detected once he is off blood thinner  [name]

## 2022-06-01 NOTE — Telephone Encounter (Signed)
Patient states he is having a cardioversion on 06/13/2022 now and he was told he can not stop taking the Xarelto now. Informed patient then we would need to reschedule his procedure that is schedule for 06/07/2022. He states no he is not reschedule the colonoscopy and EGD because this is important to because it is making sure he does not have cancer. Informed him she can not take any biopsy or take any polyps out if he is on the Xarelto because the risk for bleeding is so much worse. He states he wants to keep the procedure as it is right now and he is going to talk to cardiology and she If they can push back the Cardioversion so he can have this procedure done because both procedures are both very important. He states he will let us know what he decides by the end of the day or tomorrow.

## 2022-06-01 NOTE — Telephone Encounter (Signed)
Pt c/o medication issue:  1. Name of Medication: Xarelto  2. How are you currently taking this medication (dosage and times per day)?   3. Are you having a reaction (difficulty breathing--STAT)?   4. What is your medication issue?  Patient said he is scheduled  for a Cardioversion on 06-13-22. He is scheduled for Colonoscopy on 06-07-22. He has to stop his Xarelto for 06-05-22-06-06-22, and 06-07-22 for the Colonoscopy.Marland Kitchen He wanted to let Dr Graciela Husbands to know this, about stopping his Xarelto and this is alright.

## 2022-06-01 NOTE — Telephone Encounter (Signed)
Pt called stating he is schedule for Cardioversion on 5/28 but also schedule for a colonoscopy on 06/07/22. He stated he was instructed to hold xarelto for 2 days prior.   Nurse informed pt that in order to have a cardioversion you can't miss a dose of xarelto. Nurse recommended pt contact his gastroenterologist to make aware and request surgical clearance.  Pt verbalized understanding.

## 2022-06-02 ENCOUNTER — Ambulatory Visit: Payer: Medicare Other | Attending: Internal Medicine

## 2022-06-02 ENCOUNTER — Encounter: Payer: Self-pay | Admitting: Gastroenterology

## 2022-06-02 DIAGNOSIS — I4819 Other persistent atrial fibrillation: Secondary | ICD-10-CM

## 2022-06-03 LAB — ECHOCARDIOGRAM COMPLETE
AR max vel: 4.25 cm2
AV Area VTI: 3.78 cm2
AV Area mean vel: 4.09 cm2
AV Mean grad: 2 mmHg
AV Peak grad: 3.4 mmHg
Ao pk vel: 0.92 m/s
Calc EF: 58.3 %
S' Lateral: 3.3 cm
Single Plane A2C EF: 58.4 %
Single Plane A4C EF: 56.3 %

## 2022-06-05 ENCOUNTER — Telehealth: Payer: Self-pay

## 2022-06-05 NOTE — Telephone Encounter (Signed)
Spoke with pt and advised colonoscopy clearance has been received again.  Recommended pt proceed with GI provider recommendations to hold Xarelto x 2 doses and RN will reschedule cardioversion.  See clearance dated 05/15/2022 as below. Emily Monge,NP-preop advised regarding plans to reschedule cardioversion so pt may hold his Xarelto as instructed. Phone call to scheduling to cancel cardioversion and will reschedule after pt's GI procedures completed Pt verbalizes understanding and agrees with current plan.       Patient Name: [name]  DOB: 04/26/1955 MRN: [id number]   Primary Cardiologist: Timothy Gollan, MD   Clinical pharmacists have reviewed the patient's past medical history, labs, and current medications as part of preoperative protocol coverage. The following recommendations have been made:     Per office protocol, patient can hold Xarelto for 1-2 days prior to procedure.    I will route this recommendation to the requesting party via Epic fax function and remove from pre-op pool.   Please call with questions.   Dick Jr, Ernest Henry, NP 05/15/2022, 4:03 PM      

## 2022-06-05 NOTE — Telephone Encounter (Signed)
   Request for surgical clearance:  What type of surgery is being performed? Colonoscopy and EGD    When is this surgery scheduled? 06/07/2022   Are there any medications that need to be held prior to surgery and how long?Xarelto    Name of physician performing surgery? Rohini Vanga    What is the office phone and fax number? [phone]   ____________________________________________________   For Provider: Dr. Graciela Husbands

## 2022-06-05 NOTE — Telephone Encounter (Signed)
Spoke with patient who informed me that he is still scheduled for both cardioversion on 06/13/22 and colonoscopy on 06/07/22. Patient stated that Dr. Graciela Husbands said that he can stay on his Xarelto and have his colonoscopy as well in order to go through with the cardioversion.

## 2022-06-05 NOTE — Telephone Encounter (Addendum)
Spoke with pt and advised colonoscopy clearance has been received again.  Recommended pt proceed with GI provider recommendations to hold Xarelto x 2 doses and RN will reschedule cardioversion.  See clearance dated 05/15/2022 as below. Irving Burton Monge,NP-preop advised regarding plans to reschedule cardioversion so pt may hold his Xarelto as instructed. Phone call to scheduling to cancel cardioversion and will reschedule after pt's GI procedures completed Pt verbalizes understanding and agrees with current plan.       Patient Name: [name]  DOB: August 14, 1955 MRN: [id number]   Primary Cardiologist: [name], MD   Clinical pharmacists have reviewed the patient's past medical history, labs, and current medications as part of preoperative protocol coverage. The following recommendations have been made:     Per office protocol, patient can hold Xarelto for 1-2 days prior to procedure.    I will route this recommendation to the requesting party via Epic fax function and remove from pre-op pool.   Please call with questions.   Napoleon Form, Leodis Rains, NP 05/15/2022, 4:03 PM

## 2022-06-06 ENCOUNTER — Encounter: Payer: Self-pay | Admitting: Gastroenterology

## 2022-06-06 ENCOUNTER — Ambulatory Visit: Payer: Medicare Other

## 2022-06-06 DIAGNOSIS — M6281 Muscle weakness (generalized): Secondary | ICD-10-CM

## 2022-06-06 DIAGNOSIS — G8929 Other chronic pain: Secondary | ICD-10-CM

## 2022-06-06 NOTE — Telephone Encounter (Signed)
Patient with diagnosis of afib on Xarelto for anticoagulation.    Procedure: colonoscopy Date of procedure: 06/07/22   CHA2DS2-VASc Score = 4   This indicates a 4.8% annual risk of stroke. The patient's score is based upon: CHF History: 1 HTN History: 1 Diabetes History: 0 Stroke History: 0 Vascular Disease History: 1 Age Score: 1 Gender Score: 0      Patient had attempted cardioversion 05/19/22 but did not convert, therefore he does not need to wait the 4 weeks. His next cardioversion will need to be pushed out so that he is anticoagulated for 3 weeks prior. He already held Xarelto last night  CrCl 99 ml/min  Per office protocol, patient can hold Xarelto for 2 days prior to procedure.     **This guidance is not considered finalized until pre-operative APP has relayed final recommendations.**

## 2022-06-06 NOTE — Telephone Encounter (Signed)
See also documentation below.  Patient with diagnosis of afib on Xarelto for anticoagulation.     Procedure: colonoscopy Date of procedure: 06/07/22     CHA2DS2-VASc Score = 4   This indicates a 4.8% annual risk of stroke. The patient's score is based upon: CHF History: 1 HTN History: 1 Diabetes History: 0 Stroke History: 0 Vascular Disease History: 1 Age Score: 1 Gender Score: 0       Patient had attempted cardioversion 05/19/22 but did not convert, therefore he does not need to wait the 4 weeks. His next cardioversion will need to be pushed out so that he is anticoagulated for 3 weeks prior. He already held Xarelto last night   CrCl 99 ml/min   Per office protocol, patient can hold Xarelto for 2 days prior to procedure.       **This guidance is not considered finalized until pre-operative APP has relayed final recommendations.**

## 2022-06-07 ENCOUNTER — Other Ambulatory Visit: Payer: Self-pay

## 2022-06-07 ENCOUNTER — Ambulatory Visit: Payer: Medicare Other | Admitting: Anesthesiology

## 2022-06-07 ENCOUNTER — Encounter: Payer: Self-pay | Admitting: Gastroenterology

## 2022-06-07 ENCOUNTER — Ambulatory Visit
Admission: RE | Admit: 2022-06-07 | Discharge: 2022-06-07 | Disposition: A | Discharge status: Home / Self Care | Payer: Medicare Other | Source: Ambulatory Visit | Attending: Gastroenterology | Admitting: Gastroenterology

## 2022-06-07 ENCOUNTER — Encounter
Admission: RE | Disposition: A | Discharge status: Home / Self Care | Payer: Self-pay | Source: Ambulatory Visit | Attending: Gastroenterology

## 2022-06-07 DIAGNOSIS — K648 Other hemorrhoids: Secondary | ICD-10-CM | POA: Insufficient documentation

## 2022-06-07 DIAGNOSIS — R935 Abnormal findings on diagnostic imaging of other abdominal regions, including retroperitoneum: Secondary | ICD-10-CM | POA: Insufficient documentation

## 2022-06-07 DIAGNOSIS — I2510 Atherosclerotic heart disease of native coronary artery without angina pectoris: Secondary | ICD-10-CM | POA: Insufficient documentation

## 2022-06-07 DIAGNOSIS — Z7901 Long term (current) use of anticoagulants: Secondary | ICD-10-CM | POA: Insufficient documentation

## 2022-06-07 DIAGNOSIS — K625 Hemorrhage of anus and rectum: Secondary | ICD-10-CM | POA: Insufficient documentation

## 2022-06-07 DIAGNOSIS — Z86010 Personal history of colonic polyps: Secondary | ICD-10-CM | POA: Diagnosis not present

## 2022-06-07 DIAGNOSIS — D500 Iron deficiency anemia secondary to blood loss (chronic): Secondary | ICD-10-CM

## 2022-06-07 DIAGNOSIS — R933 Abnormal findings on diagnostic imaging of other parts of digestive tract: Secondary | ICD-10-CM

## 2022-06-07 DIAGNOSIS — K644 Residual hemorrhoidal skin tags: Secondary | ICD-10-CM | POA: Diagnosis not present

## 2022-06-07 DIAGNOSIS — Z87891 Personal history of nicotine dependence: Secondary | ICD-10-CM | POA: Diagnosis not present

## 2022-06-07 DIAGNOSIS — D509 Iron deficiency anemia, unspecified: Secondary | ICD-10-CM | POA: Insufficient documentation

## 2022-06-07 DIAGNOSIS — I10 Essential (primary) hypertension: Secondary | ICD-10-CM | POA: Diagnosis not present

## 2022-06-07 DIAGNOSIS — Z9884 Bariatric surgery status: Secondary | ICD-10-CM | POA: Diagnosis not present

## 2022-06-07 DIAGNOSIS — K5730 Diverticulosis of large intestine without perforation or abscess without bleeding: Secondary | ICD-10-CM | POA: Diagnosis not present

## 2022-06-07 DIAGNOSIS — K5790 Diverticulosis of intestine, part unspecified, without perforation or abscess without bleeding: Secondary | ICD-10-CM | POA: Diagnosis not present

## 2022-06-07 DIAGNOSIS — I480 Paroxysmal atrial fibrillation: Secondary | ICD-10-CM | POA: Diagnosis not present

## 2022-06-07 DIAGNOSIS — Z980 Intestinal bypass and anastomosis status: Secondary | ICD-10-CM | POA: Diagnosis not present

## 2022-06-07 HISTORY — PX: ENTEROSCOPY: SHX5533

## 2022-06-07 HISTORY — PX: COLONOSCOPY WITH PROPOFOL: SHX5780

## 2022-06-07 HISTORY — PX: ESOPHAGOGASTRODUODENOSCOPY (EGD) WITH PROPOFOL: SHX5813

## 2022-06-07 SURGERY — COLONOSCOPY WITH PROPOFOL
Anesthesia: General

## 2022-06-07 MED ORDER — DEXMEDETOMIDINE HCL IN NACL 80 MCG/20ML IV SOLN
INTRAVENOUS | Status: DC | PRN
  Administered 2022-06-07: 10 ug via INTRAVENOUS

## 2022-06-07 MED ORDER — GLYCOPYRROLATE 0.2 MG/ML IJ SOLN
INTRAMUSCULAR | Status: DC | PRN
  Administered 2022-06-07 (×2): .1 mg via INTRAVENOUS

## 2022-06-07 MED ORDER — LIDOCAINE HCL (CARDIAC) PF 100 MG/5ML IV SOSY
PREFILLED_SYRINGE | INTRAVENOUS | Status: DC | PRN
  Administered 2022-06-07: 60 mg via INTRAVENOUS

## 2022-06-07 MED ORDER — GLYCOPYRROLATE 0.2 MG/ML IJ SOLN
INTRAMUSCULAR | Status: AC
  Filled 2022-06-07: qty 1

## 2022-06-07 MED ORDER — SODIUM CHLORIDE 0.9 % IV SOLN: INTRAVENOUS | Status: DC

## 2022-06-07 MED ORDER — PROPOFOL 500 MG/50ML IV EMUL
INTRAVENOUS | Status: DC | PRN
  Administered 2022-06-07: 125 ug/kg/min via INTRAVENOUS

## 2022-06-07 MED ORDER — LIDOCAINE HCL (PF) 2 % IJ SOLN
INTRAMUSCULAR | Status: AC
  Filled 2022-06-07: qty 5

## 2022-06-07 MED ORDER — PROPOFOL 10 MG/ML IV BOLUS
INTRAVENOUS | Status: DC | PRN
  Administered 2022-06-07: 20 mg via INTRAVENOUS
  Administered 2022-06-07: 30 mg via INTRAVENOUS
  Administered 2022-06-07 (×3): 50 mg via INTRAVENOUS
  Administered 2022-06-07: 100 mg via INTRAVENOUS
  Administered 2022-06-07: 40 mg via INTRAVENOUS
  Administered 2022-06-07 (×2): 30 mg via INTRAVENOUS
  Administered 2022-06-07: 40 mg via INTRAVENOUS

## 2022-06-07 MED ORDER — PROPOFOL 1000 MG/100ML IV EMUL
INTRAVENOUS | Status: AC
  Filled 2022-06-07: qty 100

## 2022-06-07 NOTE — Anesthesia Postprocedure Evaluation (Signed)
Anesthesia Post Note  Patient: [name]  Procedure(s) Performed: COLONOSCOPY WITH PROPOFOL ESOPHAGOGASTRODUODENOSCOPY (EGD) WITH PROPOFOL ENTEROSCOPY  Patient location during evaluation: Endoscopy Anesthesia Type: General Level of consciousness: awake and alert Pain management: pain level controlled Vital Signs Assessment: post-procedure vital signs reviewed and stable Respiratory status: spontaneous breathing, nonlabored ventilation, respiratory function stable and patient connected to nasal cannula oxygen Cardiovascular status: blood pressure returned to baseline and stable Postop Assessment: no apparent nausea or vomiting Anesthetic complications: no   No notable events documented.   Last Vitals:  Vitals:   06/07/22 0845 06/07/22 0855  BP: 116/64 (!) 140/95  Pulse: 88 88  Resp: 12 19  Temp:    SpO2: 99% 100%    Last Pain:  Vitals:   06/07/22 0855  TempSrc:   PainSc: 0-No pain                 Cleda Mccreedy Ebonique Hallstrom

## 2022-06-07 NOTE — Op Note (Signed)
[facility] Gastroenterology Patient Name: [name] Procedure Date: 06/07/2022 7:45 AM MRN: [id number] Account #: [id number] Date of Birth: February 06, 1955 Admit Type: Outpatient Age: 67 Room: [facility] ENDO ROOM 2 Gender: Male Note Status: Finalized Instrument Name: Upper Endoscope [id number] Procedure:             Upper GI endoscopy Indications:           Abnormal CT of the GI tract Providers:             Toney Reil MD, MD Medicines:             General Anesthesia Complications:         No immediate complications. Estimated blood loss: None. Procedure:             Pre-Anesthesia Assessment:                        - Prior to the procedure, a History and Physical was                         performed, and patient medications and allergies were                         reviewed. The patient is competent. The risks and                         benefits of the procedure and the sedation options and                         risks were discussed with the patient. All questions                         were answered and informed consent was obtained.                         Patient identification and proposed procedure were                         verified by the physician, the nurse, the                         anesthesiologist, the anesthetist and the technician                         in the pre-procedure area in the procedure room in the                         endoscopy suite. Mental Status Examination: alert and                         oriented. Airway Examination: normal oropharyngeal                         airway and neck mobility. Respiratory Examination:                         clear to auscultation. CV Examination: normal.  Prophylactic Antibiotics: The patient does not require                         prophylactic antibiotics. Prior Anticoagulants: The                         patient has taken Xarelto (rivaroxaban), last dose was                          3 days prior to procedure. ASA Grade Assessment: III -                         A patient with severe systemic disease. After                         reviewing the risks and benefits, the patient was                         deemed in satisfactory condition to undergo the                         procedure. The anesthesia plan was to use general                         anesthesia. Immediately prior to administration of                         medications, the patient was re-assessed for adequacy                         to receive sedatives. The heart rate, respiratory                         rate, oxygen saturations, blood pressure, adequacy of                         pulmonary ventilation, and response to care were                         monitored throughout the procedure. The physical                         status of the patient was re-assessed after the                         procedure.                        After obtaining informed consent, the endoscope was                         passed under direct vision. Throughout the procedure,                         the patient's blood pressure, pulse, and oxygen                         saturations were monitored continuously. The Endoscope  was introduced through the mouth, and advanced to the                         efferent jejunal loop. The upper GI endoscopy was                         accomplished without difficulty. The patient tolerated                         the procedure well. Findings:      Evidence of a Roux-en-Y gastrojejunostomy was found. The gastrojejunal       anastomosis was characterized by healthy appearing mucosa. This was       traversed. The pouch-to-jejunum limb was characterized by healthy       appearing mucosa.      The gastroesophageal junction and examined esophagus were normal. Impression:            - Roux-en-Y gastrojejunostomy with gastrojejunal                          anastomosis characterized by healthy appearing mucosa.                        - Normal gastroesophageal junction and esophagus.                        - No specimens collected. Recommendation:        - Perform a small bowel enteroscopy (SBE) today. Procedure Code(s):     --- Professional ---                        [phone], Esophagogastroduodenoscopy, flexible,                         transoral; diagnostic, including collection of                         specimen(s) by brushing or washing, when performed                         (separate procedure) Diagnosis Code(s):     --- Professional ---                        Z98.0, Intestinal bypass and anastomosis status                        R93.3, Abnormal findings on diagnostic imaging of                         other parts of digestive tract CPT copyright 2022 American Medical Association. All rights reserved. The codes documented in this report are preliminary and upon coder review may  be revised to meet current compliance requirements. Dr. Libby Maw Toney Reil MD, MD 06/07/2022 8:03:05 AM This report has been signed electronically. Number of Addenda: 0 Note Initiated On: 06/07/2022 7:45 AM Estimated Blood Loss:  Estimated blood loss: none.      [facility]

## 2022-06-07 NOTE — H&P (Signed)
Arlyss Repress, MD [address]  [address]  [place], [place] [address]  Main: [phone]  Fax: [phone] Pager: [phone]  Primary Care Physician:  Smitty Cords, DO Primary Gastroenterologist:  Dr. Arlyss Repress  Pre-Procedure History & Physical: HPI:  [name] is a 67 y.o. male is here for an endoscopy and colonoscopy.   Past Medical History:  Diagnosis Date   Anxiety    Aortic atherosclerosis (HCC)    Cardiomyopathy (in setting of Afib)    a.) TTE 12/26/2013: EF 45-50%, mild ant and antsept HK. mild MR. Mod dil LA. nl RV fxn. Rhythm was Afib; b.) TTE 07/04/2019: EF 55%, mid-apical anteroseptal HK, mild MAC, mild Ao sclerosis, G2DD, RVSP 45.3   Chronic pain syndrome    a.) followed by pain management   Chronic, continuous use of opioids    a.) hydrocodone/APAP 7.5/325 mg; followed by pain management   Coronary artery disease 11/06/2014   a.) cCTA 11/06/2014: Ca score 224 (all in pLAD) -- 74th percentile for age/sex matched control   Diverticulosis    DJD (degenerative joint disease) of knee    History of hiatal hernia    History of kidney stones 2012   Hyperlipidemia    Hyperplastic colon polyp    Hypertension    Inguinal hernia    left   Internal hemorrhoids    Intervertebral disc disorder with radiculopathy of lumbosacral region    Long term current use FULL DOSE (325 mg) aspirin    Long term current use of amiodarone    Long term current use of antithrombotics/antiplatelets    a.) rivaroxaban   Lower extremity edema    Mixed hyperlipidemia    Myalgia due to statin    Osteoarthritis    PAF (paroxysmal atrial fibrillation) (HCC)    a.) CHA2DS2VASc = 4 (age, HTN, CHF, vascular disease history);  b.) s/p DCCV 03/13/2014 (200 J x1); c.) s/p DCCV 05/15/2019 (150 J x 1, 200 J x2); d.) rate/rhythm maintained on oral amiodarone + metoprolol succinate; chronically anticoagulated with rivaroxaban   Pernicious anemia    Prostate cancer (HCC)     Pulmonary nodule, right    a. 10/2014 Cardiac CTA: 7mm RLL nodule; b. 04/2015 CT Chest: stable 7mm RLL nodule. No new nodules; 02/2017 CTA Chest: stable, benign, 7mm RLL pulm nodule.   Sleep difficulties    a.) takes melatonin + trazodone PRN   SVT (supraventricular tachycardia)    Tubular adenoma of colon     Past Surgical History:  Procedure Laterality Date   BICEPT TENODESIS Right 09/27/2021   Procedure: Right reverse shoulder arthroplasty, biceps tenodesis;  Surgeon: Signa Kell, MD;  Location: [facility] ORS;  Service: Orthopedics;  Laterality: Right;   CARDIOVERSION N/A 05/15/2019   Procedure: CARDIOVERSION;  Surgeon: Antonieta Iba, MD;  Location: [facility] ORS;  Service: Cardiovascular;  Laterality: N/A;   CARDIOVERSION N/A 03/13/2014   Procedure: CARDIOVERSION; Location: [facility]; Surgeon: [name], MD   CARDIOVERSION N/A 03/24/2022   Procedure: CARDIOVERSION;  Surgeon: Antonieta Iba, MD;  Location: [facility] OR[facility];  Service: Cardiovascular;  Laterality: N/A;   CARDIOVERSION N/A 05/19/2022   Procedure: CARDIOVERSION;  Surgeon: Antonieta Iba, MD;  Location: [facility] OR[facility];  Service: Cardiovascular;  Laterality: N/A;   CARPAL TUNNEL RELEASE Right 12/23/2014   Procedure: CARPAL TUNNEL RELEASE;  Surgeon: Deeann Saint, MD;  Location: [facility];  Service: Orthopedics;  Laterality: Right;   CARPAL TUNNEL RELEASE Left 01/06/2015   Procedure: CARPAL TUNNEL RELEASE;  Surgeon:  Deeann Saint, MD;  Location: [facility];  Service: Orthopedics;  Laterality: Left;   COLONOSCOPY WITH PROPOFOL N/A 10/08/2017   Procedure: COLONOSCOPY WITH PROPOFOL;  Surgeon: Toney Reil, MD;  Location: [facility] ENDOSCOPY;  Service: Gastroenterology;  Laterality: N/A;   GASTRIC BYPASS  01/17/2000   KNEE SURGERY Right    knee trauma x3   prostate seeding     REVERSE SHOULDER ARTHROPLASTY Right 09/27/2021   Procedure: Right reverse shoulder arthroplasty, biceps tenodesis;  Surgeon: Signa Kell, MD;  Location: [facility] ORS;   Service: Orthopedics;  Laterality: Right;   SHOULDER ARTHROSCOPY WITH ROTATOR CUFF REPAIR AND OPEN BICEPS TENODESIS Left 12/27/2021   Procedure: Left shoulder arthroscopic cuff repair (supraspinatus and subscapularis) with Regeneten Patch application;  Surgeon: Signa Kell, MD;  Location: [facility] OR[facility];  Service: Orthopedics;  Laterality: Left;    Prior to Admission medications   Medication Sig Start Date End Date Taking? Authorizing Provider  amiodarone (PACERONE) 200 MG tablet Take 2 tablets (400 mg) by mouth twice a day x 2 weeks 05/04/22  Yes Duke Salvia, MD  benazepril (LOTENSIN) 40 MG tablet TAKE ONE TABLET BY MOUTH ONE TIME DAILY Patient taking differently: Take 40 mg by mouth every morning. TAKE ONE TABLET BY MOUTH ONE TIME DAILY 08/22/21  Yes Gollan, Tollie Pizza, MD  busPIRone (BUSPAR) 10 MG tablet TAKE ONE TABLET BY MOUTH FOUR TIMES A DAY AS NEEDED FOR ANXIETY 04/13/22  Yes Karamalegos, Netta Neat, DO  cyclobenzaprine (FLEXERIL) 10 MG tablet TAKE ONE TABLET BY MOUTH THREE TIMES A DAY AS NEEDED FOR MUSCLE SPASM 03/20/22  Yes Karamalegos, Netta Neat, DO  DULoxetine (CYMBALTA) 30 MG capsule TAKE ONE CAPSULE BY MOUTH ONE TIME DAILY 04/13/22  Yes Karamalegos, Netta Neat, DO  metoprolol succinate (TOPROL-XL) 50 MG 24 hr tablet TAKE ONE TABLET BY MOUTH ONE TIME DAILY WITH OR IMMEDIATELY FOLLOWING A MEAL 11/14/21  Yes Gollan, Tollie Pizza, MD  Multiple Vitamin (MULTIVITAMIN WITH MINERALS) TABS tablet Take 1 tablet by mouth daily.   Yes [provider]  Calcium-Magnesium-Vitamin D (OPURITY CALCIUM CITRATE PLUS) 300-20-200 MG-MG-UNIT CHEW Chew 2 tablets by mouth daily. Silver leaf brand 300 Mg supplement    [provider]  carboxymethylcellulose (REFRESH PLUS) 0.5 % SOLN 1 drop 3 (three) times daily as needed.    [provider]  CHIA SEED PO Take 2 capsules by mouth daily. 750 mg supplement  2 by mouth in the morning.    [provider]  cyanocobalamin (VITAMIN B12)  1000 MCG/ML injection Inject 1 mL (1,000 mcg total) into the skin every 30 (thirty) days. INJECT EVERY 30 DAYS AS DIRECTED 09/21/21   Althea Charon, Netta Neat, DO  ferrous sulfate 325 (65 FE) MG EC tablet Take 325 mg by mouth 3 (three) times daily with meals.    [provider]  HYDROcodone-acetaminophen (NORCO) 7.5-325 MG tablet Take 1 tablet by mouth every 6 (six) hours as needed for severe pain. Must last 30 days 05/21/22 06/20/22  Edward Jolly, MD  HYDROcodone-acetaminophen (NORCO) 7.5-325 MG tablet Take 1 tablet by mouth every 6 (six) hours as needed for severe pain. Must last 30 days 06/20/22 07/20/22  Edward Jolly, MD  HYDROcodone-acetaminophen (NORCO) 7.5-325 MG tablet Take 1 tablet by mouth every 6 (six) hours as needed for severe pain. Must last 30 days 07/20/22 08/19/22  Edward Jolly, MD  melatonin 3 MG TABS tablet Take 3 mg by mouth at bedtime. 03/04/21   [provider]  rivaroxaban (XARELTO) 20 MG TABS tablet  TAKE ONE TABLET BY MOUTH ONE TIME DAILY WITH SUPPER 05/22/22   Antonieta Iba, MD  traZODone (DESYREL) 150 MG tablet TAKE ONE TABLET BY MOUTH AT BEDTIME 04/13/22   Karamalegos, Netta Neat, DO  Vitamin D, Ergocalciferol, (DRISDOL) 1.25 MG (50000 UNIT) CAPS capsule Take 50,000 Units by mouth once a week. 02/24/22   [provider]    Allergies as of 05/15/2022 - Review Complete 05/15/2022  Allergen Reaction Noted   Bupivacaine liposome Palpitations 02/07/2022    Family History  Problem Relation Age of Onset   Heart disease Father    Hypertension Father    Stroke Father    Cancer Father        prostate cancer   Cancer Sister    Arrhythmia Sister        A-fib   Arrhythmia Brother        A-fib   Cancer Brother     Social History   Socioeconomic History   Marital status: Married    Spouse name: Nelva Bush   Number of children: Not on file   Years of education: Not on file   Highest education level: Not on file  Occupational History   Not on file   Tobacco Use   Smoking status: Former    Packs/day: 1.00    Years: 10.00    Additional pack years: 0.00    Total pack years: 10.00    Types: Cigarettes    Quit date: 02/18/1979    Years since quitting: 43.3   Smokeless tobacco: Former    Types: Snuff  Vaping Use   Vaping Use: Never used  Substance and Sexual Activity   Alcohol use: Not Currently    Comment: last drink in 2021   Drug use: No   Sexual activity: Not on file  Other Topics Concern   Not on file  Social History Narrative   Lives in [place]; with wife. Retd from [employer]; quit in 1980; used to drink alcohol; no beer in last 2022.    Social Determinants of Health   Financial Resource Strain: Low Risk  (02/24/2022)   Overall Financial Resource Strain (CARDIA)    Difficulty of Paying Living Expenses: Not hard at all  Food Insecurity: No Food Insecurity (04/26/2022)   Hunger Vital Sign    Worried About Running Out of Food in the Last Year: Never true    Ran Out of Food in the Last Year: Never true  Transportation Needs: No Transportation Needs (02/24/2022)   PRAPARE - Administrator, Civil Service (Medical): No    Lack of Transportation (Non-Medical): No  Physical Activity: Insufficiently Active (02/24/2022)   Exercise Vital Sign    Days of Exercise per Week: 2 days    Minutes of Exercise per Session: 60 min  Stress: No Stress Concern Present (02/24/2022)   [employer] of Occupational Health - Occupational Stress Questionnaire    Feeling of Stress : Not at all  Social Connections: Moderately Integrated (02/24/2022)   Social Connection and Isolation Panel [NHANES]    Frequency of Communication with Friends and Family: More than three times a week    Frequency of Social Gatherings with Friends and Family: More than three times a week    Attends Religious Services: More than 4 times per year    Active Member of [employer] or Organizations: No    Attends Banker Meetings: Never    Marital  Status: Married  Catering manager Violence: Not At Risk (  04/26/2022)   Humiliation, Afraid, Rape, and Kick questionnaire    Fear of Current or Ex-Partner: No    Emotionally Abused: No    Physically Abused: No    Sexually Abused: No    Review of Systems: See HPI, otherwise negative ROS  Physical Exam: BP (!) 132/92   Pulse 77   Temp 98.5 F (36.9 C) (Temporal)   Resp 16   Ht 6' (1.829 m)   Wt (!) 151 kg   SpO2 97%   BMI 45.16 kg/m  General:   Alert,  pleasant and cooperative in NAD Head:  Normocephalic and atraumatic. Neck:  Supple; no masses or thyromegaly. Lungs:  Clear throughout to auscultation.    Heart:  Regular rate and rhythm. Abdomen:  Soft, nontender and nondistended. Normal bowel sounds, without guarding, and without rebound.   Neurologic:  Alert and  oriented x4;  grossly normal neurologically.  Impression/Plan: [name] is here for an endoscopy and colonoscopy to be performed for Iron deficiency anemia, small bowel lesion on imaging, rectal bleeding   Risks, benefits, limitations, and alternatives regarding  endoscopy and colonoscopy have been reviewed with the patient.  Questions have been answered.  All parties agreeable.   Lannette Donath, MD  06/07/2022, 7:43 AM

## 2022-06-07 NOTE — Transfer of Care (Signed)
Immediate Anesthesia Transfer of Care Note  Patient: [name]  Procedure(s) Performed: COLONOSCOPY WITH PROPOFOL ESOPHAGOGASTRODUODENOSCOPY (EGD) WITH PROPOFOL ENTEROSCOPY  Patient Location: PACU  Anesthesia Type:General  Level of Consciousness: drowsy  Airway & Oxygen Therapy: Patient Spontanous Breathing  Post-op Assessment: Report given to RN and Post -op Vital signs reviewed and stable  Post vital signs: Reviewed and stable  Last Vitals:  Vitals Value Taken Time  BP 125/67 06/07/22 0835  Temp 36.7 C 06/07/22 0835  Pulse 86 06/07/22 0837  Resp 15 06/07/22 0837  SpO2 92 % 06/07/22 0837  Vitals shown include unvalidated device data.  Last Pain:  Vitals:   06/07/22 0835  TempSrc: Temporal  PainSc: Asleep         Complications: No notable events documented.

## 2022-06-07 NOTE — Op Note (Signed)
[facility] Patient Name: [name] Procedure Date: 06/07/2022 7:44 AM MRN: [id number] Account #: [id number] Date of Birth: July 16, 1955 Admit Type: Outpatient Age: 67 Room: [facility] ENDO ROOM 2 Gender: Male Note Status: Finalized Instrument Name: Colonoscope [id number] Procedure:             Colonoscopy Indications:           Last colonoscopy: September 2019, Rectal bleeding Providers:             Toney Reil MD, MD Medicines:             General Anesthesia Complications:         No immediate complications. Estimated blood loss: None. Procedure:             Pre-Anesthesia Assessment:                        - Prior to the procedure, a History and Physical was                         performed, and patient medications and allergies were                         reviewed. The patient is competent. The risks and                         benefits of the procedure and the sedation options and                         risks were discussed with the patient. All questions                         were answered and informed consent was obtained.                         Patient identification and proposed procedure were                         verified by the physician, the nurse, the                         anesthesiologist, the anesthetist and the technician                         in the pre-procedure area in the procedure room in the                         endoscopy suite. Mental Status Examination: alert and                         oriented. Airway Examination: normal oropharyngeal                         airway and neck mobility. Respiratory Examination:                         clear to auscultation. CV Examination: normal.                         Prophylactic Antibiotics:  The patient does not require                         prophylactic antibiotics. Prior Anticoagulants: The                         patient has taken Xarelto (rivaroxaban), last dose was                          3 days prior to procedure. ASA Grade Assessment: III -                         A patient with severe systemic disease. After                         reviewing the risks and benefits, the patient was                         deemed in satisfactory condition to undergo the                         procedure. The anesthesia plan was to use general                         anesthesia. Immediately prior to administration of                         medications, the patient was re-assessed for adequacy                         to receive sedatives. The heart rate, respiratory                         rate, oxygen saturations, blood pressure, adequacy of                         pulmonary ventilation, and response to care were                         monitored throughout the procedure. The physical                         status of the patient was re-assessed after the                         procedure.                        After obtaining informed consent, the colonoscope was                         passed under direct vision. Throughout the procedure,                         the patient's blood pressure, pulse, and oxygen                         saturations were monitored continuously. The  Colonoscope was introduced through the anus and                         advanced to the the cecum, identified by appendiceal                         orifice and ileocecal valve. The colonoscopy was                         performed without difficulty. The patient tolerated                         the procedure well. The quality of the bowel                         preparation was evaluated using the BBPS [facility] Bowel                         Preparation Scale) with scores of: Right Colon = 3,                         Transverse Colon = 3 and Left Colon = 3 (entire mucosa                         seen well with no residual staining, small fragments                          of stool or opaque liquid). The total BBPS score                         equals 9. The ileocecal valve, appendiceal orifice,                         and rectum were photographed. Findings:      The perianal and digital rectal examinations were normal. Pertinent       negatives include normal sphincter tone and no palpable rectal lesions.      Multiple large-mouthed diverticula were found in the recto-sigmoid colon       and sigmoid colon.      Non-bleeding external and internal hemorrhoids were found during       retroflexion. The hemorrhoids were medium-sized.      The exam was otherwise without abnormality. Impression:            - Diverticulosis in the recto-sigmoid colon and in the                         sigmoid colon.                        - Non-bleeding external and internal hemorrhoids.                        - The examination was otherwise normal.                        - No specimens collected. Recommendation:        - Discharge patient to home (with escort).                        -  Resume previous diet today.                        - Continue present medications.                        - Resume Xarelto (rivaroxaban) today at prior dose.                         Refer to managing physician for further adjustment of                         therapy. Procedure Code(s):     --- Professional ---                        [phone], Colonoscopy, flexible; diagnostic, including                         collection of specimen(s) by brushing or washing, when                         performed (separate procedure) Diagnosis Code(s):     --- Professional ---                        K64.8, Other hemorrhoids                        K62.5, Hemorrhage of anus and rectum                        K57.30, Diverticulosis of large intestine without                         perforation or abscess without bleeding CPT copyright 2022 American Medical Association. All rights reserved. The codes documented in  this report are preliminary and upon coder review may  be revised to meet current compliance requirements. Dr. Libby Maw Toney Reil MD, MD 06/07/2022 8:32:44 AM This report has been signed electronically. Number of Addenda: 0 Note Initiated On: 06/07/2022 7:44 AM Scope Withdrawal Time: 0 hours 7 minutes 54 seconds  Total Procedure Duration: 0 hours 10 minutes 49 seconds  Estimated Blood Loss:  Estimated blood loss: none.      [facility]

## 2022-06-07 NOTE — Op Note (Signed)
[facility] Patient Name: [name] Procedure Date: 06/07/2022 8:04 AM MRN: [id number] Account #: [id number] Date of Birth: Jul 14, 1955 Admit Type: Outpatient Age: 67 Room: 2 Gender: Male Note Status: Finalized Instrument Name: Peds Colonoscope [id number] Procedure:             Small bowel enteroscopy Indications:           Abnormal abdominal CT, Unexplained iron deficiency                         anemia Providers:             Toney Reil MD, MD Medicines:             General Anesthesia Complications:         No immediate complications. Estimated blood loss: None. Procedure:             Pre-Anesthesia Assessment:                        - Prior to the procedure, a History and Physical was                         performed, and patient medications and allergies were                         reviewed. The patient is competent. The risks and                         benefits of the procedure and the sedation options and                         risks were discussed with the patient. All questions                         were answered and informed consent was obtained.                         Patient identification and proposed procedure were                         verified by the physician, the nurse, the                         anesthesiologist, the anesthetist and the technician                         in the pre-procedure area in the procedure room in the                         endoscopy suite. Mental Status Examination: alert and                         oriented. Airway Examination: normal oropharyngeal                         airway and neck mobility. Respiratory Examination:                         clear to auscultation. CV Examination: normal.  Prophylactic Antibiotics: The patient does not require                         prophylactic antibiotics. Prior Anticoagulants: The                         patient has taken  Xarelto (rivaroxaban), last dose was                         3 days prior to procedure. ASA Grade Assessment: III -                         A patient with severe systemic disease. After                         reviewing the risks and benefits, the patient was                         deemed in satisfactory condition to undergo the                         procedure. The anesthesia plan was to use general                         anesthesia. Immediately prior to administration of                         medications, the patient was re-assessed for adequacy                         to receive sedatives. The heart rate, respiratory                         rate, oxygen saturations, blood pressure, adequacy of                         pulmonary ventilation, and response to care were                         monitored throughout the procedure. The physical                         status of the patient was re-assessed after the                         procedure.                        After obtaining informed consent, the endoscope was                         passed under direct vision. Throughout the procedure,                         the patient's blood pressure, pulse, and oxygen                         saturations were monitored continuously. The  Colonoscope was introduced through the mouth and                         advanced to the afferent and efferent jejunal loop.                         The small bowel enteroscopy was accomplished without                         difficulty. The patient tolerated the procedure well. Findings:      The esophagus was normal.      The stomach pouch was normal. Biopsies were taken with a cold forceps       for histology.      There was no evidence of significant pathology in the entire examined       portion of jejunum.      There was no evidence of significant pathology in the afferent jejunal       loop and in the efferent jejunal loop.  Biopsies were taken with a cold       forceps for histology.      Evidence of a Roux-en-Y gastrojejunostomy was found. The gastrojejunal       anastomosis was characterized by healthy appearing mucosa. This was       traversed. The pouch-to-jejunum limb was characterized by healthy       appearing mucosa. The jejunojejunal anastomosis was characterized by       healthy appearing mucosa. The duodenum-to-jejunum limb was examined. Impression:            - Normal esophagus.                        - Normal stomach. Biopsied.                        - The examined portion of the jejunum was normal.                        - The examined portion of the jejunum was normal.                         Biopsied.                        - Roux-en-Y gastrojejunostomy with gastrojejunal                         anastomosis characterized by healthy appearing mucosa. Recommendation:        - Proceed with colonoscopy as scheduled                        See colonoscopy report                        - Await pathology results. Procedure Code(s):     --- Professional ---                        [id number], Small intestinal endoscopy, enteroscopy beyond                         second portion of duodenum, not  including ileum; with                         biopsy, single or multiple Diagnosis Code(s):     --- Professional ---                        Z98.0, Intestinal bypass and anastomosis status                        R93.3, Abnormal findings on diagnostic imaging of                         other parts of digestive tract                        D50.9, Iron deficiency anemia, unspecified CPT copyright 2022 American Medical Association. All rights reserved. The codes documented in this report are preliminary and upon coder review may  be revised to meet current compliance requirements. Dr. Libby Maw Toney Reil MD, MD 06/07/2022 8:18:21 AM This report has been signed electronically. Number of Addenda: 0 Note  Initiated On: 06/07/2022 8:04 AM Estimated Blood Loss:  Estimated blood loss: none.      [facility]

## 2022-06-07 NOTE — Anesthesia Preprocedure Evaluation (Signed)
Anesthesia Evaluation  Patient identified by MRN, date of birth, ID band Patient awake    Reviewed: Allergy & Precautions, NPO status , Patient's Chart, lab work & pertinent test results  History of Anesthesia Complications Negative for: history of anesthetic complications  Airway Mallampati: III  TM Distance: <3 FB Neck ROM: full    Dental  (+) Chipped   Pulmonary neg pulmonary ROS, neg shortness of breath, former smoker   Pulmonary exam normal        Cardiovascular Exercise Tolerance: Good hypertension, (-) angina + CAD  Normal cardiovascular exam     Neuro/Psych  Neuromuscular disease  negative psych ROS   GI/Hepatic Neg liver ROS, hiatal hernia,GERD  Controlled,,  Endo/Other  negative endocrine ROS    Renal/GU negative Renal ROS  negative genitourinary   Musculoskeletal   Abdominal   Peds  Hematology negative hematology ROS (+)   Anesthesia Other Findings Past Medical History: No date: Anxiety No date: Aortic atherosclerosis (HCC) No date: Cardiomyopathy (in setting of Afib)     Comment:  a.) TTE 12/26/2013: EF 45-50%, mild ant and antsept HK.               mild MR. Mod dil LA. nl RV fxn. Rhythm was Afib; b.) TTE               07/04/2019: EF 55%, mid-apical anteroseptal HK, mild MAC,              mild Ao sclerosis, G2DD, RVSP 45.3 No date: Chronic pain syndrome     Comment:  a.) followed by pain management No date: Chronic, continuous use of opioids     Comment:  a.) hydrocodone/APAP 7.5/325 mg; followed by pain               management 11/06/2014: Coronary artery disease     Comment:  a.) cCTA 11/06/2014: Ca score 224 (all in pLAD) -- 74th               percentile for age/sex matched control No date: Diverticulosis No date: DJD (degenerative joint disease) of knee No date: History of hiatal hernia 2012: History of kidney stones No date: Hyperlipidemia No date: Hyperplastic colon polyp No date:  Hypertension No date: Inguinal hernia     Comment:  left No date: Internal hemorrhoids No date: Intervertebral disc disorder with radiculopathy of  lumbosacral region No date: Long term current use FULL DOSE (325 mg) aspirin No date: Long term current use of amiodarone No date: Long term current use of antithrombotics/antiplatelets     Comment:  a.) rivaroxaban No date: Lower extremity edema No date: Mixed hyperlipidemia No date: Myalgia due to statin No date: Osteoarthritis No date: PAF (paroxysmal atrial fibrillation) (HCC)     Comment:  a.) CHA2DS2VASc = 4 (age, HTN, CHF, vascular disease               history);  b.) s/p DCCV 03/13/2014 (200 J x1); c.) s/p               DCCV 05/15/2019 (150 J x 1, 200 J x2); d.) rate/rhythm               maintained on oral amiodarone + metoprolol succinate;               chronically anticoagulated with rivaroxaban No date: Pernicious anemia No date: Prostate cancer (HCC) No date: Pulmonary nodule, right     Comment:  a. 10/2014 Cardiac CTA: 7mm RLL  nodule; b. 04/2015 CT               Chest: stable 7mm RLL nodule. No new nodules; 02/2017 CTA               Chest: stable, benign, 7mm RLL pulm nodule. No date: Sleep difficulties     Comment:  a.) takes melatonin + trazodone PRN No date: SVT (supraventricular tachycardia) No date: Tubular adenoma of colon  Past Surgical History: 09/27/2021: BICEPT TENODESIS; Right     Comment:  Procedure: Right reverse shoulder arthroplasty, biceps               tenodesis;  Surgeon: Signa Kell, MD;  Location: [facility]               ORS;  Service: Orthopedics;  Laterality: Right; 05/15/2019: CARDIOVERSION; N/A     Comment:  Procedure: CARDIOVERSION;  Surgeon: Antonieta Iba,               MD;  Location: [facility] ORS;  Service: Cardiovascular;                Laterality: N/A; 03/13/2014: CARDIOVERSION; N/A     Comment:  Procedure: CARDIOVERSION; Location: [facility]; Surgeon:               [name], MD 03/24/2022:  CARDIOVERSION; N/A     Comment:  Procedure: CARDIOVERSION;  Surgeon: Antonieta Iba,               MD;  Location: [facility] ORS;  Service: Cardiovascular;                Laterality: N/A; 05/19/2022: CARDIOVERSION; N/A     Comment:  Procedure: CARDIOVERSION;  Surgeon: Antonieta Iba,               MD;  Location: [facility] ORS;  Service: Cardiovascular;                Laterality: N/A; 12/23/2014: CARPAL TUNNEL RELEASE; Right     Comment:  Procedure: CARPAL TUNNEL RELEASE;  Surgeon: Deeann Saint, MD;  Location: [facility] ORS;  Service: Orthopedics;                Laterality: Right; 01/06/2015: CARPAL TUNNEL RELEASE; Left     Comment:  Procedure: CARPAL TUNNEL RELEASE;  Surgeon: Deeann Saint, MD;  Location: [facility] ORS;  Service: Orthopedics;                Laterality: Left; 10/08/2017: COLONOSCOPY WITH PROPOFOL; N/A     Comment:  Procedure: COLONOSCOPY WITH PROPOFOL;  Surgeon: Toney Reil, MD;  Location: [facility] ENDOSCOPY;  Service:               Gastroenterology;  Laterality: N/A; 01/17/2000: GASTRIC BYPASS No date: KNEE SURGERY; Right     Comment:  knee trauma x3 No date: prostate seeding 09/27/2021: REVERSE SHOULDER ARTHROPLASTY; Right     Comment:  Procedure: Right reverse shoulder arthroplasty, biceps               tenodesis;  Surgeon: Signa Kell, MD;  Location: [facility]               ORS;  Service: Orthopedics;  Laterality: Right; 12/27/2021: SHOULDER ARTHROSCOPY WITH ROTATOR CUFF  REPAIR AND OPEN  BICEPS TENODESIS; Left     Comment:  Procedure: Left shoulder arthroscopic cuff repair               (supraspinatus and subscapularis) with Regeneten Patch               application;  Surgeon: Signa Kell, MD;  Location: [facility]               ORS;  Service: Orthopedics;  Laterality: Left;  BMI    Body Mass Index: 45.16 kg/m      Reproductive/Obstetrics negative OB ROS                             Anesthesia  Physical Anesthesia Plan  ASA: 3  Anesthesia Plan: General   Post-op Pain Management:    Induction: Intravenous  PONV Risk Score and Plan: Propofol infusion and TIVA  Airway Management Planned: Natural Airway and Nasal Cannula  Additional Equipment:   Intra-op Plan:   Post-operative Plan:   Informed Consent: I have reviewed the patients History and Physical, chart, labs and discussed the procedure including the risks, benefits and alternatives for the proposed anesthesia with the patient or authorized representative who has indicated his/her understanding and acceptance.     Dental Advisory Given  Plan Discussed with: Anesthesiologist, CRNA and Surgeon  Anesthesia Plan Comments: (Patient consented for risks of anesthesia including but not limited to:  - adverse reactions to medications - risk of airway placement if required - damage to eyes, teeth, lips or other oral mucosa - nerve damage due to positioning  - sore throat or hoarseness - Damage to heart, brain, nerves, lungs, other parts of body or loss of life  Patient voiced understanding.)       Anesthesia Quick Evaluation

## 2022-06-08 ENCOUNTER — Encounter: Payer: Self-pay | Admitting: Gastroenterology

## 2022-06-08 ENCOUNTER — Ambulatory Visit: Payer: Medicare Other

## 2022-06-09 ENCOUNTER — Telehealth: Payer: Self-pay | Admitting: *Deleted

## 2022-06-09 NOTE — Telephone Encounter (Signed)
Patient called ad states that he has had his colonoscopy and now wants to know what the next steps are. Please return his call.

## 2022-06-09 NOTE — Telephone Encounter (Signed)
CAll returned to patient and informed of doctor response, he asked about the pathology an day I told him that it is not back yet form the upper endo. Biopsy. He will await a call from GI with results of bx

## 2022-06-13 ENCOUNTER — Ambulatory Visit: Admit: 2022-06-13 | Payer: Medicare Other | Admitting: Internal Medicine

## 2022-06-13 SURGERY — CARDIOVERSION
Anesthesia: General

## 2022-06-14 NOTE — Therapy (Signed)
OUTPATIENT PHYSICAL THERAPY SHOULDER TREATMENT/PROGRESS NOTE  Dates of reporting period  04/11/22   to   06/06/22   Patient Name: [name] MRN: [id number] DOB:02/15/1955, 67 y.o., male Today's Date: 06/15/2022   PT End of Session - 06/15/22 1358     Visit Number 30    Number of Visits 49    Date for PT Re-Evaluation 06/20/22    Authorization Type [employer] Medicare    Authorization Time Period [employer] medicare 2024  [id number] on [place]    PT Start Time 1401    PT Stop Time 1445    PT Time Calculation (min) 44 min    Activity Tolerance Patient tolerated treatment well    Behavior During Therapy WFL for tasks assessed/performed             Past Medical History:  Diagnosis Date   Anxiety    Aortic atherosclerosis (HCC)    Cardiomyopathy (in setting of Afib)    a.) TTE 12/26/2013: EF 45-50%, mild ant and antsept HK. mild MR. Mod dil LA. nl RV fxn. Rhythm was Afib; b.) TTE 07/04/2019: EF 55%, mid-apical anteroseptal HK, mild MAC, mild Ao sclerosis, G2DD, RVSP 45.3   Chronic pain syndrome    a.) followed by pain management   Chronic, continuous use of opioids    a.) hydrocodone/APAP 7.5/325 mg; followed by pain management   Coronary artery disease 11/06/2014   a.) cCTA 11/06/2014: Ca score 224 (all in pLAD) -- 74th percentile for age/sex matched control   Diverticulosis    DJD (degenerative joint disease) of knee    History of hiatal hernia    History of kidney stones 2012   Hyperlipidemia    Hyperplastic colon polyp    Hypertension    Inguinal hernia    left   Internal hemorrhoids    Intervertebral disc disorder with radiculopathy of lumbosacral region    Long term current use FULL DOSE (325 mg) aspirin    Long term current use of amiodarone    Long term current use of antithrombotics/antiplatelets    a.) rivaroxaban   Lower extremity edema    Mixed hyperlipidemia    Myalgia due to statin    Osteoarthritis    PAF (paroxysmal atrial fibrillation) (HCC)    a.) CHA2DS2VASc = 4  (age, HTN, CHF, vascular disease history);  b.) s/p DCCV 03/13/2014 (200 J x1); c.) s/p DCCV 05/15/2019 (150 J x 1, 200 J x2); d.) rate/rhythm maintained on oral amiodarone + metoprolol succinate; chronically anticoagulated with rivaroxaban   Pernicious anemia    Prostate cancer (HCC)    Pulmonary nodule, right    a. 10/2014 Cardiac CTA: 7mm RLL nodule; b. 04/2015 CT Chest: stable 7mm RLL nodule. No new nodules; 02/2017 CTA Chest: stable, benign, 7mm RLL pulm nodule.   Sleep difficulties    a.) takes melatonin + trazodone PRN   SVT (supraventricular tachycardia)    Tubular adenoma of colon    Past Surgical History:  Procedure Laterality Date   BICEPT TENODESIS Right 09/27/2021   Procedure: Right reverse shoulder arthroplasty, biceps tenodesis;  Surgeon: Signa Kell, MD;  Location: [facility];  Service: Orthopedics;  Laterality: Right;   CARDIOVERSION N/A 05/15/2019   Procedure: CARDIOVERSION;  Surgeon: Antonieta Iba, MD;  Location: [facility];  Service: Cardiovascular;  Laterality: N/A;   CARDIOVERSION N/A 03/13/2014   Procedure: CARDIOVERSION; Location: [facility]; Surgeon: [name], MD   CARDIOVERSION N/A 03/24/2022   Procedure: CARDIOVERSION;  Surgeon: Antonieta Iba, MD;  Location:  [facility];  Service: Cardiovascular;  Laterality: N/A;   CARDIOVERSION N/A 05/19/2022   Procedure: CARDIOVERSION;  Surgeon: Antonieta Iba, MD;  Location: [facility];  Service: Cardiovascular;  Laterality: N/A;   CARPAL TUNNEL RELEASE Right 12/23/2014   Procedure: CARPAL TUNNEL RELEASE;  Surgeon: Deeann Saint, MD;  Location: [facility];  Service: Orthopedics;  Laterality: Right;   CARPAL TUNNEL RELEASE Left 01/06/2015   Procedure: CARPAL TUNNEL RELEASE;  Surgeon: Deeann Saint, MD;  Location: [facility];  Service: Orthopedics;  Laterality: Left;   COLONOSCOPY WITH PROPOFOL N/A 10/08/2017   Procedure: COLONOSCOPY WITH PROPOFOL;  Surgeon: Toney Reil, MD;  Location: [facility] ENDOSCOPY;  Service:  Gastroenterology;  Laterality: N/A;   COLONOSCOPY WITH PROPOFOL N/A 06/07/2022   Procedure: COLONOSCOPY WITH PROPOFOL;  Surgeon: Toney Reil, MD;  Location: [facility] ENDOSCOPY;  Service: Gastroenterology;  Laterality: N/A;   ENTEROSCOPY  06/07/2022   Procedure: ENTEROSCOPY;  Surgeon: Toney Reil, MD;  Location: [facility] ENDOSCOPY;  Service: Gastroenterology;;   ESOPHAGOGASTRODUODENOSCOPY (EGD) WITH PROPOFOL N/A 06/07/2022   Procedure: ESOPHAGOGASTRODUODENOSCOPY (EGD) WITH PROPOFOL;  Surgeon: Toney Reil, MD;  Location[facility] ENDOSCOPY;  Service: Gastroenterology;  Laterality: N/A;   GASTRIC BYPASS  01/17/2000   KNEE SURGERY Right    knee trauma x3   prostate seeding     REVERSE SHOULDER ARTHROPLASTY Right 09/27/2021   Procedure: Right reverse shoulder arthroplasty, biceps tenodesis;  Surgeon: Signa Kell, MD;  Location: [facility];  Service: Orthopedics;  Laterality: Right;   SHOULDER ARTHROSCOPY WITH ROTATOR CUFF REPAIR AND OPEN BICEPS TENODESIS Left 12/27/2021   Procedure: Left shoulder arthroscopic cuff repair (supraspinatus and subscapularis) with Regeneten Patch application;  Surgeon: Signa Kell, MD;  Location: [facility];  Service: Orthopedics;  Laterality: Left;   Patient Active Problem List   Diagnosis Date Noted   Symptomatic anemia 04/26/2022   Chronic pain of both knees 03/30/2022   Bilateral primary osteoarthritis of knee 03/30/2022   Shortness of breath 03/24/2022   Vitamin D deficiency 02/24/2022   S/p reverse total shoulder arthroplasty 09/27/2021   Lesion of bone of lumbosacral spine (L5) 05/12/2021   Localized osteoarthritis of shoulder regions, bilateral 03/29/2021   Chronic pain of both shoulders 03/29/2021   Drug-induced myopathy 02/21/2021   Trigger finger of right hand 11/15/2020   Prostate cancer (HCC) 07/26/2020   Insomnia 08/01/2019   Primary osteoarthritis of both wrists 02/17/2019   Trigger middle finger of left hand 02/17/2019   Chronic pain  syndrome 02/17/2019   Chronic radicular lumbar pain 10/08/2018   Lumbar radiculopathy 10/08/2018   Lumbar degenerative disc disease 10/08/2018   Lumbar facet arthropathy 10/08/2018   Lumbar facet joint syndrome 10/08/2018   Intervertebral disc disorder with radiculopathy of lumbosacral region    Osteoarthritis of knee 11/30/2017   Osteoarthritis of wrist 11/30/2017   Pernicious anemia 05/22/2016   Persistent atrial fibrillation (HCC) 12/20/2015   Carpal tunnel syndrome 11/16/2014   DJD (degenerative joint disease) of knee 10/27/2014   Bilateral carpal tunnel syndrome 10/27/2014   Morbid obesity with BMI of 50.0-59.9, adult (HCC) 10/27/2014   Mixed hyperlipidemia 10/27/2014   H/O gastric bypass 03/20/2014   Hyperkalemia 03/20/2014   History of prostate cancer 12/26/2013   Essential hypertension 12/26/2013   Encounter for anticoagulation discussion and counseling 12/26/2013   PCP: Smitty Cords, DO  REFERRING PROVIDER: Signa Kell, MD  REFERRING DIAGNOSIS: Incomplete tear of left rotator cuff, unspecified whether traumatic M75.112   THERAPY DIAG: Chronic left shoulder pain  Chronic right shoulder pain  Muscle weakness (generalized)  RATIONALE FOR EVALUATION AND TREATMENT: Rehabilitation  ONSET DATE: Surgery 12/27/21, Chronic L shoulder pain for multiple years  FOLLOW UP APPT WITH PROVIDER: Yes    FROM INITIAL EVALUATION SUBJECTIVE:                                                                                                                                                                                         Chief Complaint: S/p L shoulder RTC repair, biceps tenodesis, and shoulder debridement  Pertinent History Pt with history of chronic bilateral shoulder pain. He underwent right reverse TSR and right biceps tenodesis on 09/27/21. He experienced post-operative instability in R shoulder however it improved and he was able to avoid a revision. On 12/27/21  he underwent left arthroscopic rotator cuff repair (subscapularis (full-thickness), supraspinatus (partial-thickness) side-to-side repair + Regeneten patch), left arthroscopic biceps tenodesis, and left extensive debridement of shoulder (glenohumeral and subacromial spaces). Per surgical note other intraoperative findings include Type 2 SLAP tear, degenerative anterior labral tearing, grade 2 degenerative changes to articular cartilage of humeral head, and significant posterior capsule synovitis. No significant post-operative complications. He arrives without abduction pillow and states that Dr. Allena Katz advised him he doesn't have to wear the abduction pillow. He got behind on his pain medication today and states that today his shoulder has been the most painful he has experienced since surgery. He also noticed swelling in L lateral upper arm this morning. He did take his pain medication at 14:00, before PT evaluation.  He has a history of chronic back pain and underwent a lumbar facet, medial branch radiofrequency ablation on 01/02/22. At the time of the evaluation pt has not yet appreciated improvement in his back pain from this procedure. He has a past medical history of Cardiomyopathy (in setting of Afib), DJD (degenerative joint disease) of knee, History of kidney stones, Intervertebral disc disorder with radiculopathy of lumbosacral region, Kidney stone (2012), Lower extremity edema, PAF (paroxysmal atrial fibrillation) (HCC), Pernicious anemia, Prostate cancer (HCC), Pulmonary nodule, right, and SVT (supraventricular tachycardia) (HCC). He also  has a past surgical history that includes Knee surgery; Gastric bypass (2002); prostate seeding; Carpal tunnel release (Right, 12/23/2014); Carpal tunnel release (Left, 01/06/2015); Colonoscopy with propofol (N/A, 10/08/2017); and Cardioversion (N/A, 05/15/2019).   Pain:  Pain Intensity: Present: 5/10, Best: 3/10, Worst: 10/10 Pain location: Anterior L shoulder Pain  Quality: aching and throbbing Radiating: No  Numbness/Tingling: No Focal Weakness: Yes History of prior shoulder or neck/shoulder injury, pain, surgery, or therapy: Yes, chronic bilateral shoulder pain with R TSR. 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 No AROM or WB through LUE (per protocol), Passive elevation and ER in staged ROM to begin at week 4 (elevation 90 degrees weeks 4-6 with no ER; 0-120 weeks 6-8 with ER 0-30 degrees). No pulleys or cane until after 6 weeks, recommend no driving x 6-8 weeks.   Weight Bearing Restrictions: Yes No WB through LUE for at least 6 weeks  Living Environment Lives with: lives with their spouse Lives in: House/apartment  Patient Goals: Improve L shoulder strength and pain. Pt would like to be able to play with/lift his grandson as well as take care of his property.  OBJECTIVE:   Patient Surveys  FOTO: To be completed QuickDASH: 47.7%  Cognition Patient is oriented to person, place, and time.  Recent memory is intact.  Remote memory is intact.  Attention span and concentration are intact.  Expressive speech is intact.  Patient's fund of knowledge is within normal limits for educational level.    Gross Musculoskeletal Assessment Tremor: None Bulk: Mild swelling noted in L lateral upper arm. Pt denies chills, fevers, redness, or dischrage from inicisions Tone: Normal  Posture Forward head and rounded shoulders  AROM Full L wrist AROM  flexion, extension, radial deviation, and ulnar deviation. MCP flexion and extension WNL. Unable to assess L shoulder and elbow AROM per protocol  PROM Full L elbow PROM flexion and extension however during first attempt at slow passive extension pt reports sudden pain in L shoulder which eventually improve after a couple minutes of rest. Full L elbow PROM pronation/supination.   LE MMT: Full L wrist strength for flexion, extension, radial deviation, and ulnar deviation. L grip strength is intact and strong. Deferred testing of L elbow and shoulder strength per protocol  Sensation Grossly intact to light touch throughout LUE as determined by testing dermatomes C2-T2. Proprioception and hot/cold testing deferred on this date.  Palpation Pt is generally tender to light palpation around anterior, lateral, and posterior L shoulder;    TODAY'S TREATMENT    SUBJECTIVE: Pt reports his R shoulder is doing alright however his L shoulder continues to be painful and it interrupts his sleep. He has an upcoming appointment to see Dr. Allena Katz next week. No specific questions or concerns currently.    PAIN: No resting shoulder pain reported upon arrival   Ther-ex  Updated outcome measures with patient: QuickDASH (L shoulder): 56.8% Worst pain in L shoulder: 9/10; FOTO: 51 L shoulder MMT: flexion: 4/5 (painful), abduction: 4-/5 (painful), IR (seated): 5/5, ER (seated): 4+/5 (painful);  All exercises performed sitting fully upright today unless otherwise specified: R shoulder flexion from neutral to 130 degrees with 4# dumbbell (DB) 2 x 10 R shoulder scaption from neutral to around 110 degrees with 4# DB 2 x 10; R shoulder internal rotation from forearm vertical to stomach with manual resistance from therapist 2 x 10; R shoulder external rotation from stomach to forearm vertical with manual resistance from therapist 2 x 10; R elbow manually resisted flexion 2 x 10; R elbow manually resisted  extension 2 x 10; L shoulder AROM flexion with straight elbow from neutral to around 130 degrees with no weight x 10; L  shoulder AROM scaption with straight elbow from neutral to around 110 degrees with no weight, x 5, no further reps due to pain; L shoulder IR from vertical to stomach with light manual resistance x 10, no pain reported; L shoulder ER from stomach to vertical with light manual resistance x 10, no pain reported;  L elbow manually resisted flexion x 10; L elbow manually resisted extension x 10; Seated chest press with cane and manual resistance from therapist x 10; Seated chest press with cane and manual resistance from therapist x 10;   PATIENT EDUCATION:  Education details:  Pt educated throughout session about proper posture and technique with exercises. Improved exercise technique, movement at target joints, use of target muscles after min to mod verbal, visual, tactile cues. Person educated: Patient Education method: Explanation, verbal cues Education comprehension: verbalized understanding and returned demonstration   HOME EXERCISE PROGRAM: Access Code: [email or web address] URL: [email or web address] Date: 03/28/2022 Prepared by: Ria Comment  Exercises - Wrist Flexion AROM  - 1 x daily - 7 x weekly - 2 sets - 10 reps - 3s hold - Wrist Extension AROM  - 1 x daily - 7 x weekly - 2 sets - 10 reps - 3s hold - Seated Scapular Retraction  - 1 x daily - 7 x weekly - 2 sets - 10 reps - 3s hold - Standing Elbow Flexion Extension AROM  - 1 x daily - 7 x weekly - 2 sets - 10 reps - 3s hold - Standing Shoulder Flexion to 90 Degrees  - 1 x daily - 7 x weekly - 2 sets - 10 reps - 3s hold - Shoulder Abduction - Thumbs Up  - 1 x daily - 7 x weekly - 2 sets - 10 reps - 3s hold   ASSESSMENT:  CLINICAL IMPRESSION: Pt reporting continuation of his L shoulder pain and weakness. It appears that it has been slightly worsening since his goals were last updated. Updated outcome  measures/goals for L shoulder today. His FOTO improved from 4 at initial evaluation to 53 today however this is a decline from 67 when it was last updated. His worst pain has decreased from 10/10 to 9/10 however it was down to 4/10 when last updated. His QuickDASH has remained unchanged at 56.8%. He is able to perform AROM of L shoulder in all directions against gravity. L shoulder flexion is 4/5 and abduction is 4-/5 with both being painful with resistance. His L shoulder ER is 4+/5 but also painful. IR is 5/5 and pain free. Pt is able to continue RUE during strengthening today. Continued L shoulder strengthening however regressed to no external resistance. L shoulder scaption is painful against gravity and he is only able to perform 5 repetitions. Significant concern for re-tear of L RTC. Pt encouraged to continue HEP and follow-up as scheduled. Plan to progress LUE per protocol as pt is able to tolerate in addition to progressing resistance with RUE strengthening against gravity. He will benefit from skilled PT to address deficits in range of motion and strength in order to improve overall function at home and with leisure activities such as caring for his property.  REHAB POTENTIAL: Good  CLINICAL DECISION MAKING: Evolving/moderate complexity  EVALUATION COMPLEXITY: Moderate   GOALS: Goals reviewed with patient? Yes  SHORT TERM GOALS: Target date: 05/09/2022   Pt will be independent with HEP to improve strength and decrease right shoulder pain to improve pain-free function at home and work. Baseline:  Goal status: ONGOING  LONG TERM GOALS: Target date: 06/20/22;  Pt will increase L shoulder FOTO to at least 51 to demonstrate significant improvement in function at home and with leisure activities related to L shoulder. Baseline: 01/03/22: To be completed; 01/24/22: 4; 02/28/22: 59; 04/11/22: 55; 06/15/22: 51 Goal status: ACHIEVED  2.  Pt will decrease worst L shoulder pain by at least 3 points  on the NPRS in order to demonstrate clinically significant reduction in R shoulder pain. Baseline: 01/03/22: worst: 10/10; 02/28/22: 10/10; 04/11/22: 4/10; 06/15/22: 9/10; Goal status: ONGOING  3.  Pt will decrease quick DASH score by at least 8% in order to demonstrate clinically significant reduction in disability related to L shoulder pain        Baseline: 01/03/22: 47.7%; 02/28/22: 65.91%; 04/11/22: 56.8%; 06/15/22: 56.8% Goal status: ONGOING  4. Pt will demonstrate L shoulder flexion, abduction, IR, and ER strength of at least 4/5 in order to demonstrate improvement in strength and function       Baseline: 01/03/22: Unable to test per protocol; 02/28/22: At least 3/5 but unable to fully test due to protocol; 04/11/22: flexion: 4/5, abduction: 4-/5 (IR and ER at least 3/5 but not tested); 06/15/22: flexion: 4/5 (painful), abduction: 4-/5 (painful), IR (seated): 5/5, ER (seated): 4+/5 (painful); Goal status: PARTIALLY MET  PLAN: PT FREQUENCY: 1-2x/week  PT DURATION: 12 weeks  PLANNED INTERVENTIONS: Therapeutic exercises, Therapeutic activity, Neuromuscular re-education, Balance training, Gait training, Patient/Family education, Joint manipulation, Joint mobilization, Vestibular training, Canalith repositioning, Aquatic Therapy, Dry Needling, Electrical stimulation, Spinal manipulation, Spinal mobilization, Cryotherapy, Moist heat, Traction, Ultrasound, Ionotophoresis 4mg /ml Dexamethasone, and Manual therapy  PLAN FOR NEXT SESSION: Progress L shoulder ROM and strength per protocol, continue R shoulder strengthening   Sharalyn Ink Kanan Sobek PT, DPT, GCS  Physical Therapist- [facility]

## 2022-06-15 ENCOUNTER — Ambulatory Visit: Payer: Medicare Other

## 2022-06-15 ENCOUNTER — Telehealth: Payer: Self-pay | Admitting: Internal Medicine

## 2022-06-15 ENCOUNTER — Other Ambulatory Visit: Payer: Self-pay

## 2022-06-15 DIAGNOSIS — N401 Enlarged prostate with lower urinary tract symptoms: Secondary | ICD-10-CM

## 2022-06-15 DIAGNOSIS — M25512 Pain in left shoulder: Secondary | ICD-10-CM | POA: Diagnosis not present

## 2022-06-15 DIAGNOSIS — R7309 Other abnormal glucose: Secondary | ICD-10-CM

## 2022-06-15 DIAGNOSIS — G8929 Other chronic pain: Secondary | ICD-10-CM

## 2022-06-15 DIAGNOSIS — M6281 Muscle weakness (generalized): Secondary | ICD-10-CM | POA: Diagnosis not present

## 2022-06-15 DIAGNOSIS — E782 Mixed hyperlipidemia: Secondary | ICD-10-CM

## 2022-06-15 DIAGNOSIS — D510 Vitamin B12 deficiency anemia due to intrinsic factor deficiency: Secondary | ICD-10-CM

## 2022-06-15 DIAGNOSIS — Z0000 Encounter for general adult medical examination without abnormal findings: Secondary | ICD-10-CM

## 2022-06-15 DIAGNOSIS — I10 Essential (primary) hypertension: Secondary | ICD-10-CM

## 2022-06-15 DIAGNOSIS — M25511 Pain in right shoulder: Secondary | ICD-10-CM | POA: Diagnosis not present

## 2022-06-15 NOTE — Telephone Encounter (Signed)
Pt calling to confirm orders have been placed for his labs prior to DCCV.  Pt advised lab orders have been placed and pt will walk in to the medical mall at [facility] to have these completed on 07/19/2022.  Pt verbalizes understanding and thanked Charity fundraiser for the call.

## 2022-06-15 NOTE — Telephone Encounter (Signed)
Patient would like a call to discuss his labs needed before his cardioversion. Please call to discuss.

## 2022-06-15 NOTE — Telephone Encounter (Signed)
Pt's DCCV rescheduled to 07/25/2022 per pt request.

## 2022-06-18 NOTE — Therapy (Signed)
OUTPATIENT PHYSICAL THERAPY SHOULDER TREATMENT/RECERTIFICATION  Patient Name: TAO OGONOWSKI MRN: [id number] DOB:Jul 02, 1955, 67 y.o., male Today's Date: 06/20/2022   PT End of Session - 06/20/22 1350     Visit Number 31    Number of Visits 73    Date for PT Re-Evaluation 09/12/22    Authorization Type [employer] Medicare    Authorization Time Period [employer] medicare 2024  NW:GNFAO on [place]    PT Start Time 1406    PT Stop Time 1445    PT Time Calculation (min) 39 min    Activity Tolerance Patient tolerated treatment well    Behavior During Therapy WFL for tasks assessed/performed            Past Medical History:  Diagnosis Date   Anxiety    Aortic atherosclerosis (HCC)    Cardiomyopathy (in setting of Afib)    a.) TTE 12/26/2013: EF 45-50%, mild ant and antsept HK. mild MR. Mod dil LA. nl RV fxn. Rhythm was Afib; b.) TTE 07/04/2019: EF 55%, mid-apical anteroseptal HK, mild MAC, mild Ao sclerosis, G2DD, RVSP 45.3   Chronic pain syndrome    a.) followed by pain management   Chronic, continuous use of opioids    a.) hydrocodone/APAP 7.5/325 mg; followed by pain management   Coronary artery disease 11/06/2014   a.) cCTA 11/06/2014: Ca score 224 (all in pLAD) -- 74th percentile for age/sex matched control   Diverticulosis    DJD (degenerative joint disease) of knee    History of hiatal hernia    History of kidney stones 2012   Hyperlipidemia    Hyperplastic colon polyp    Hypertension    Inguinal hernia    left   Internal hemorrhoids    Intervertebral disc disorder with radiculopathy of lumbosacral region    Long term current use FULL DOSE (325 mg) aspirin    Long term current use of amiodarone    Long term current use of antithrombotics/antiplatelets    a.) rivaroxaban   Lower extremity edema    Mixed hyperlipidemia    Myalgia due to statin    Osteoarthritis    PAF (paroxysmal atrial fibrillation) (HCC)    a.) CHA2DS2VASc = 4 (age, HTN, CHF, vascular disease history);  b.) s/p DCCV  03/13/2014 (200 J x1); c.) s/p DCCV 05/15/2019 (150 J x 1, 200 J x2); d.) rate/rhythm maintained on oral amiodarone + metoprolol succinate; chronically anticoagulated with rivaroxaban   Pernicious anemia    Prostate cancer (HCC)    Pulmonary nodule, right    a. 10/2014 Cardiac CTA: 7mm RLL nodule; b. 04/2015 CT Chest: stable 7mm RLL nodule. No new nodules; 02/2017 CTA Chest: stable, benign, 7mm RLL pulm nodule.   Sleep difficulties    a.) takes melatonin + trazodone PRN   SVT (supraventricular tachycardia)    Tubular adenoma of colon    Past Surgical History:  Procedure Laterality Date   BICEPT TENODESIS Right 09/27/2021   Procedure: Right reverse shoulder arthroplasty, biceps tenodesis;  Surgeon: Signa Kell, MD;  Location: [facility];  Service: Orthopedics;  Laterality: Right;   CARDIOVERSION N/A 05/15/2019   Procedure: CARDIOVERSION;  Surgeon: Antonieta Iba, MD;  Location: [facility];  Service: Cardiovascular;  Laterality: N/A;   CARDIOVERSION N/A 03/13/2014   Procedure: CARDIOVERSION; Location: [facility]; Surgeon: [name], MD   CARDIOVERSION N/A 03/24/2022   Procedure: CARDIOVERSION;  Surgeon: Antonieta Iba, MD;  Location: [facility];  Service: Cardiovascular;  Laterality: N/A;   CARDIOVERSION N/A 05/19/2022   Procedure:  CARDIOVERSION;  Surgeon: Antonieta Iba, MD;  Location: [facility];  Service: Cardiovascular;  Laterality: N/A;   CARPAL TUNNEL RELEASE Right 12/23/2014   Procedure: CARPAL TUNNEL RELEASE;  Surgeon: Deeann Saint, MD;  Location: [facility];  Service: Orthopedics;  Laterality: Right;   CARPAL TUNNEL RELEASE Left 01/06/2015   Procedure: CARPAL TUNNEL RELEASE;  Surgeon: Deeann Saint, MD;  Location: [facility];  Service: Orthopedics;  Laterality: Left;   COLONOSCOPY WITH PROPOFOL N/A 10/08/2017   Procedure: COLONOSCOPY WITH PROPOFOL;  Surgeon: Toney Reil, MD;  Location: [facility]COPY;  Service: Gastroenterology;  Laterality: N/A;   COLONOSCOPY WITH PROPOFOL N/A  06/07/2022   Procedure: COLONOSCOPY WITH PROPOFOL;  Surgeon: Toney Reil, MD;  Location: [facility] END[facility]COPY;  Service: Gastroenterology;  Laterality: N/A;   ENTEROSCOPY  06/07/2022   Procedure: ENTEROSCOPY;  Surgeon: Toney Reil, MD;  Location: [facility] ENDOSCOPY;  Service: Gastroenterology;;   ESOPHAGOGASTRODUODENOSCOPY (EGD) WITH PROPOFOL N/A 06/07/2022   Procedure: ESOPHAGOGASTRODUODENOSCOPY (EGD) WITH PROPOFOL;  Surgeon: Toney Reil, MD;  Location: [facility] ENDOSCOPY;  Service: Gastroenterology;  Laterality: N/A;   GASTRIC BYPASS  01/17/2000   KNEE SURGERY Right    knee trauma x3   prostate seeding     REVERSE SHOULDER ARTHROPLASTY Right 09/27/2021   Procedure: Right reverse shoulder arthroplasty, biceps tenodesis;  Surgeon: Signa Kell, MD;  Location: [facility];  Service: Orthopedics;  Laterality: Right;   SHOULDER ARTHROSCOPY WITH ROTATOR CUFF REPAIR AND OPEN BICEPS TENODESIS Left 12/27/2021   Procedure: Left shoulder arthroscopic cuff repair (supraspinatus and subscapularis) with Regeneten Patch application;  Surgeon: Signa Kell, MD;  Location: [facility];  Service: Orthopedics;  Laterality: Left;   Patient Active Problem List   Diagnosis Date Noted   Symptomatic anemia 04/26/2022   Chronic pain of both knees 03/30/2022   Bilateral primary osteoarthritis of knee 03/30/2022   Shortness of breath 03/24/2022   Vitamin D deficiency 02/24/2022   S/p reverse total shoulder arthroplasty 09/27/2021   Lesion of bone of lumbosacral spine (L5) 05/12/2021   Localized osteoarthritis of shoulder regions, bilateral 03/29/2021   Chronic pain of both shoulders 03/29/2021   Drug-induced myopathy 02/21/2021   Trigger finger of right hand 11/15/2020   Prostate cancer (HCC) 07/26/2020   Insomnia 08/01/2019   Primary osteoarthritis of both wrists 02/17/2019   Trigger middle finger of left hand 02/17/2019   Chronic pain syndrome 02/17/2019   Chronic radicular lumbar pain 10/08/2018    Lumbar radiculopathy 10/08/2018   Lumbar degenerative disc disease 10/08/2018   Lumbar facet arthropathy 10/08/2018   Lumbar facet joint syndrome 10/08/2018   Intervertebral disc disorder with radiculopathy of lumbosacral region    Osteoarthritis of knee 11/30/2017   Osteoarthritis of wrist 11/30/2017   Pernicious anemia 05/22/2016   Persistent atrial fibrillation (HCC) 12/20/2015   Carpal tunnel syndrome 11/16/2014   DJD (degenerative joint disease) of knee 10/27/2014   Bilateral carpal tunnel syndrome 10/27/2014   Morbid obesity with BMI of 50.0-59.9, adult (HCC) 10/27/2014   Mixed hyperlipidemia 10/27/2014   H/O gastric bypass 03/20/2014   Hyperkalemia 03/20/2014   History of prostate cancer 12/26/2013   Essential hypertension 12/26/2013   Encounter for anticoagulation discussion and counseling 12/26/2013   PCP: Smitty Cords, DO  REFERRING PROVIDER: Signa Kell, MD  REFERRING DIAGNOSIS: Incomplete tear of left rotator cuff, unspecified whether traumatic M75.112   THERAPY DIAG: Chronic left shoulder pain  Chronic right shoulder pain  Muscle weakness (generalized)  RATIONALE FOR EVALUATION AND TREATMENT: Rehabilitation  ONSET DATE: Surgery 12/27/21,  Chronic L shoulder pain for multiple years  FOLLOW UP APPT WITH PROVIDER: Yes    FROM INITIAL EVALUATION SUBJECTIVE:                                                                                                                                                                                         Chief Complaint: S/p L shoulder RTC repair, biceps tenodesis, and shoulder debridement  Pertinent History Pt with history of chronic bilateral shoulder pain. He underwent right reverse TSR and right biceps tenodesis on 09/27/21. He experienced post-operative instability in R shoulder however it improved and he was able to avoid a revision. On 12/27/21 he underwent left arthroscopic rotator cuff repair (subscapularis  (full-thickness), supraspinatus (partial-thickness) side-to-side repair + Regeneten patch), left arthroscopic biceps tenodesis, and left extensive debridement of shoulder (glenohumeral and subacromial spaces). Per surgical note other intraoperative findings include Type 2 SLAP tear, degenerative anterior labral tearing, grade 2 degenerative changes to articular cartilage of humeral head, and significant posterior capsule synovitis. No significant post-operative complications. He arrives without abduction pillow and states that Dr. Allena Katz advised him he doesn't have to wear the abduction pillow. He got behind on his pain medication today and states that today his shoulder has been the most painful he has experienced since surgery. He also noticed swelling in L lateral upper arm this morning. He did take his pain medication at 14:00, before PT evaluation.  He has a history of chronic back pain and underwent a lumbar facet, medial branch radiofrequency ablation on 01/02/22. At the time of the evaluation pt has not yet appreciated improvement in his back pain from this procedure. He has a past medical history of Cardiomyopathy (in setting of Afib), DJD (degenerative joint disease) of knee, History of kidney stones, Intervertebral disc disorder with radiculopathy of lumbosacral region, Kidney stone (2012), Lower extremity edema, PAF (paroxysmal atrial fibrillation) (HCC), Pernicious anemia, Prostate cancer (HCC), Pulmonary nodule, right, and SVT (supraventricular tachycardia) (HCC). He also  has a past surgical history that includes Knee surgery; Gastric bypass (2002); prostate seeding; Carpal tunnel release (Right, 12/23/2014); Carpal tunnel release (Left, 01/06/2015); Colonoscopy with propofol (N/A, 10/08/2017); and Cardioversion (N/A, 05/15/2019).   Pain:  Pain Intensity: Present: 5/10, Best: 3/10, Worst: 10/10 Pain location: Anterior L shoulder Pain Quality: aching and throbbing Radiating: No  Numbness/Tingling:  No Focal Weakness: Yes History of prior shoulder or neck/shoulder injury, pain, surgery, or therapy: Yes, chronic bilateral shoulder pain with R TSR. 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 No AROM or WB through LUE (per protocol), Passive elevation and ER in staged ROM to begin at week 4 (elevation 90 degrees weeks 4-6 with no ER; 0-120 weeks 6-8 with ER 0-30 degrees). No pulleys or cane until after 6 weeks, recommend no driving x 6-8 weeks.   Weight Bearing Restrictions: Yes No WB through LUE for at least 6 weeks  Living Environment Lives with: lives with their spouse Lives in: House/apartment  Patient Goals: Improve L shoulder strength and pain. Pt would like to be able to play with/lift his grandson as well as take care of his property.  OBJECTIVE:   Patient Surveys  FOTO: To be completed QuickDASH: 47.7%  Cognition Patient is oriented to person, place, and time.  Recent memory is intact.  Remote memory is intact.  Attention span and concentration are intact.  Expressive speech is intact.  Patient's fund of knowledge is within normal limits for educational level.    Gross Musculoskeletal Assessment Tremor: None Bulk: Mild swelling noted in L lateral upper arm. Pt denies chills, fevers, redness, or dischrage from inicisions Tone: Normal  Posture Forward head and rounded shoulders  AROM Full L wrist AROM flexion, extension, radial deviation, and ulnar deviation. MCP flexion  and extension WNL. Unable to assess L shoulder and elbow AROM per protocol  PROM Full L elbow PROM flexion and extension however during first attempt at slow passive extension pt reports sudden pain in L shoulder which eventually improve after a couple minutes of rest. Full L elbow PROM pronation/supination.   LE MMT: Full L wrist strength for flexion, extension, radial deviation, and ulnar deviation. L grip strength is intact and strong. Deferred testing of L elbow and shoulder strength per protocol  Sensation Grossly intact to light touch throughout LUE as determined by testing dermatomes C2-T2. Proprioception and hot/cold testing deferred on this date.  Palpation Pt is generally tender to light palpation around anterior, lateral, and posterior L shoulder;    TODAY'S TREATMENT    SUBJECTIVE: Pt reports his R shoulder is doing alright however his L shoulder continues to be VERY painful. He had to take a pain pill this morning for severe L shoulder pain and his pain is still 6-7/10 at the start of therapy session. He has an appointment to see Dr. Allena Katz on 06/29/22. No specific questions currently.    PAIN: 6-7/10 L shoulder pain reported upon arrival   Ther-ex  All exercises performed sitting fully upright today unless otherwise specified: R shoulder flexion from neutral to 130 degrees with 4# dumbbell (DB) 2 x 10 R shoulder scaption from neutral to around 110 degrees with 4# DB 2 x 10; R shoulder overhead press with 4# DB 2 x 10; R shoulder internal rotation from forearm vertical to stomach with manual resistance from therapist 2 x 10; R shoulder external rotation from stomach to forearm vertical with manual resistance from therapist 2 x 10; R elbow manually resisted flexion 2 x 10; R elbow manually resisted extension 2 x 10; L shoulder AROM flexion with straight elbow from neutral to around 130 degrees with no weight 2 x 10; L shoulder AROM scaption with straight elbow from  neutral to around 110 degrees with no weight, 2 x 10; L  shoulder overhead press with no weight 2 x 10; L shoulder IR from vertical to stomach with light manual resistance 2 x 10, no pain reported; L shoulder ER from stomach to vertical with light manual resistance 2 x 10, no pain reported;  L elbow manually resisted flexion 2 x 10; L elbow manually resisted extension 2 x 10; Seated chest press with cane and manual resistance from therapist 2 x 10; Seated chest press with cane and manual resistance from therapist 2 x 10;   PATIENT EDUCATION:  Education details:  Pt educated throughout session about proper posture and technique with exercises. Improved exercise technique, movement at target joints, use of target muscles after min to mod verbal, visual, tactile cues. Person educated: Patient Education method: Explanation, verbal cues Education comprehension: verbalized understanding and returned demonstration   HOME EXERCISE PROGRAM: Access Code: [email or web address] URL: [email or web address] Date: 03/28/2022 Prepared by: Ria Comment  Exercises - Wrist Flexion AROM  - 1 x daily - 7 x weekly - 2 sets - 10 reps - 3s hold - Wrist Extension AROM  - 1 x daily - 7 x weekly - 2 sets - 10 reps - 3s hold - Seated Scapular Retraction  - 1 x daily - 7 x weekly - 2 sets - 10 reps - 3s hold - Standing Elbow Flexion Extension AROM  - 1 x daily - 7 x weekly - 2 sets - 10 reps - 3s hold - Standing Shoulder Flexion to 90 Degrees  - 1 x daily - 7 x weekly - 2 sets - 10 reps - 3s hold - Shoulder Abduction - Thumbs Up  - 1 x daily - 7 x weekly - 2 sets - 10 reps - 3s hold   ASSESSMENT:  CLINICAL IMPRESSION: Pt reporting continuation of his L shoulder pain and weakness. It appears to be worsening recently. Updated outcome measures/goals for L shoulder during last visit on 06/15/22. His FOTO improved from 4 at initial evaluation to 18 however this is a decline from 22 when it was previously updated.  His worst pain has decreased from 10/10 to 9/10 however it was down to 4/10 when previously updated. His QuickDASH remained unchanged at 56.8%. He is able to perform AROM of L shoulder in all directions against gravity. L shoulder flexion is 4/5 and abduction is 4-/5 with both being painful with resistance. His L shoulder ER is 4+/5 but also painful. IR is 5/5 and pain free. Pt is able to continue RUE during strengthening today. Continued L shoulder strengthening however regressed over the last few sessions. L shoulder scaption is his most painful direction. Significant concern for re-tear of L RTC. Pt encouraged to continue HEP and follow-up as scheduled. He has a follow-up appointment with Dr. Allena Katz next week. Plan to progress LUE per protocol as pt is able to tolerate in addition to progressing resistance with RUE strengthening against gravity. He will benefit from skilled PT to address deficits in range of motion and strength in order to improve overall function at home and with leisure activities such as caring for his property.  REHAB POTENTIAL: Good  CLINICAL DECISION MAKING: Evolving/moderate complexity  EVALUATION COMPLEXITY: Moderate   GOALS: Goals reviewed with patient? Yes  SHORT TERM GOALS: Target date: 05/09/2022   Pt will be independent with HEP to improve strength and decrease right shoulder pain to improve pain-free function at home and work. Baseline:  Goal status: ACHIEVED   LONG TERM GOALS: Target date: 09/12/2022   Pt will increase  L shoulder FOTO to at least 51 to demonstrate significant improvement in function at home and with leisure activities related to L shoulder. Baseline: 01/03/22: To be completed; 01/24/22: 4; 02/28/22: 59; 04/11/22: 55; 06/15/22: 51 Goal status: ACHIEVED  2.  Pt will decrease worst L shoulder pain by at least 3 points on the NPRS in order to demonstrate clinically significant reduction in R shoulder pain. Baseline: 01/03/22: worst: 10/10; 02/28/22:  10/10; 04/11/22: 4/10; 06/15/22: 9/10; Goal status: ONGOING  3.  Pt will decrease quick DASH score by at least 8% in order to demonstrate clinically significant reduction in disability related to L shoulder pain        Baseline: 01/03/22: 47.7%; 02/28/22: 65.91%; 04/11/22: 56.8%; 06/15/22: 56.8% Goal status: ONGOING  4. Pt will demonstrate L shoulder flexion, abduction, IR, and ER strength of at least 4/5 in order to demonstrate improvement in strength and function       Baseline: 01/03/22: Unable to test per protocol; 02/28/22: At least 3/5 but unable to fully test due to protocol; 04/11/22: flexion: 4/5, abduction: 4-/5 (IR and ER at least 3/5 but not tested); 06/15/22: flexion: 4/5 (painful), abduction: 4-/5 (painful), IR (seated): 5/5, ER (seated): 4+/5 (painful); Goal status: PARTIALLY MET  PLAN: PT FREQUENCY: 1-2x/week  PT DURATION: 12 weeks  PLANNED INTERVENTIONS: Therapeutic exercises, Therapeutic activity, Neuromuscular re-education, Balance training, Gait training, Patient/Family education, Joint manipulation, Joint mobilization, Vestibular training, Canalith repositioning, Aquatic Therapy, Dry Needling, Electrical stimulation, Spinal manipulation, Spinal mobilization, Cryotherapy, Moist heat, Traction, Ultrasound, Ionotophoresis 4mg /ml Dexamethasone, and Manual therapy  PLAN FOR NEXT SESSION: Progress L shoulder ROM and strength per protocol, continue R shoulder strengthening   Sharalyn Ink Dwan Hemmelgarn PT, DPT, GCS  Physical Therapist- [place]  [facility]

## 2022-06-20 ENCOUNTER — Ambulatory Visit: Payer: Medicare Other | Attending: Orthopedic Surgery

## 2022-06-20 DIAGNOSIS — M6281 Muscle weakness (generalized): Secondary | ICD-10-CM | POA: Diagnosis not present

## 2022-06-20 DIAGNOSIS — G8929 Other chronic pain: Secondary | ICD-10-CM | POA: Diagnosis not present

## 2022-06-20 DIAGNOSIS — M25511 Pain in right shoulder: Secondary | ICD-10-CM | POA: Diagnosis not present

## 2022-06-20 DIAGNOSIS — M25512 Pain in left shoulder: Secondary | ICD-10-CM | POA: Diagnosis not present

## 2022-06-21 ENCOUNTER — Telehealth: Payer: Self-pay | Admitting: Student in an Organized Health Care Education/Training Program

## 2022-06-21 NOTE — Telephone Encounter (Signed)
He does not need authorization so you can put in when he comes in. I will let him know. Thank you

## 2022-06-21 NOTE — Telephone Encounter (Signed)
Patient wants to know if he can get injections for his trigger finger pain when he comes for PPE on 06-28-22?

## 2022-06-22 ENCOUNTER — Ambulatory Visit: Payer: Medicare Other

## 2022-06-22 DIAGNOSIS — M25512 Pain in left shoulder: Secondary | ICD-10-CM | POA: Diagnosis not present

## 2022-06-22 DIAGNOSIS — G8929 Other chronic pain: Secondary | ICD-10-CM

## 2022-06-22 DIAGNOSIS — M25511 Pain in right shoulder: Secondary | ICD-10-CM | POA: Diagnosis not present

## 2022-06-22 DIAGNOSIS — M6281 Muscle weakness (generalized): Secondary | ICD-10-CM | POA: Diagnosis not present

## 2022-06-22 NOTE — Therapy (Signed)
OUTPATIENT PHYSICAL THERAPY SHOULDER TREATMENT  Patient Name: [name] MRN: [id number] DOB:[birth year], 67 y.o., male Today's Date: 06/22/2022   PT End of Session - 06/22/22 1308     Visit Number 32    Number of Visits 73    Date for PT Re-Evaluation 09/12/22    Authorization Type UHC Medicare    Authorization Time Period UHC medicare 2024  [id number] on MN    PT Start Time 1317    PT Stop Time 1350    PT Time Calculation (min) 33 min    Activity Tolerance Patient tolerated treatment well    Behavior During Therapy WFL for tasks assessed/performed            Past Medical History:  Diagnosis Date   Anxiety    Aortic atherosclerosis (HCC)    Cardiomyopathy (in setting of Afib)    a.) TTE 12/26/2013: EF 45-50%, mild ant and antsept HK. mild MR. Mod dil LA. nl RV fxn. Rhythm was Afib; b.) TTE 07/04/2019: EF 55%, mid-apical anteroseptal HK, mild MAC, mild Ao sclerosis, G2DD, RVSP 45.3   Chronic pain syndrome    a.) followed by pain management   Chronic, continuous use of opioids    a.) hydrocodone/APAP 7.5/325 mg; followed by pain management   Coronary artery disease 11/06/2014   a.) cCTA 11/06/2014: Ca score 224 (all in pLAD) -- 74th percentile for age/sex matched control   Diverticulosis    DJD (degenerative joint disease) of knee    History of hiatal hernia    History of kidney stones 2012   Hyperlipidemia    Hyperplastic colon polyp    Hypertension    Inguinal hernia    left   Internal hemorrhoids    Intervertebral disc disorder with radiculopathy of lumbosacral region    Long term current use FULL DOSE (325 mg) aspirin    Long term current use of amiodarone    Long term current use of antithrombotics/antiplatelets    a.) rivaroxaban   Lower extremity edema    Mixed hyperlipidemia    Myalgia due to statin    Osteoarthritis    PAF (paroxysmal atrial fibrillation) (HCC)    a.) CHA2DS2VASc = 4 (age, HTN, CHF, vascular disease history);  b.) s/p DCCV 03/13/2014 (200  J x1); c.) s/p DCCV 05/15/2019 (150 J x 1, 200 J x2); d.) rate/rhythm maintained on oral amiodarone + metoprolol succinate; chronically anticoagulated with rivaroxaban   Pernicious anemia    Prostate cancer (HCC)    Pulmonary nodule, right    a. 10/2014 Cardiac CTA: 7mm RLL nodule; b. 04/2015 CT Chest: stable 7mm RLL nodule. No new nodules; 02/2017 CTA Chest: stable, benign, 7mm RLL pulm nodule.   Sleep difficulties    a.) takes melatonin + trazodone PRN   SVT (supraventricular tachycardia)    Tubular adenoma of colon    Past Surgical History:  Procedure Laterality Date   BICEPT TENODESIS Right 09/27/2021   Procedure: Right reverse shoulder arthroplasty, biceps tenodesis;  Surgeon: Signa Kell, MD;  Location: [facility];  Service: Orthopedics;  Laterality: Right;   CARDIOVERSION N/A 05/15/2019   Procedure: CARDIOVERSION;  Surgeon: Antonieta Iba, MD;  Location: [facility];  Service: Cardiovascular;  Laterality: N/A;   CARDIOVERSION N/A 03/13/2014   Procedure: CARDIOVERSION; Location: [facility]; Surgeon: [name], MD   CARDIOVERSION N/A 03/24/2022   Procedure: CARDIOVERSION;  Surgeon: Antonieta Iba, MD;  Location: [facility];  Service: Cardiovascular;  Laterality: N/A;   CARDIOVERSION N/A 05/19/2022   Procedure:  CARDIOVERSION;  Surgeon: Antonieta Iba, MD;  Location: [facility];  Service: Cardiovascular;  Laterality: N/A;   CARPAL TUNNEL RELEASE Right 12/23/2014   Procedure: CARPAL TUNNEL RELEASE;  Surgeon: Deeann Saint, MD;  Location: [facility];  Service: Orthopedics;  Laterality: Right;   CARPAL TUNNEL RELEASE Left 01/06/2015   Procedure: CARPAL TUNNEL RELEASE;  Surgeon: Deeann Saint, MD;  Location: [facility];  Service: Orthopedics;  Laterality: Left;   COLONOSCOPY WITH PROPOFOL N/A 10/08/2017   Procedure: COLONOSCOPY WITH PROPOFOL;  Surgeon: Toney Reil, MD;  Location[facility];  Service: Gastroenterology;  Laterality: N/A;   COLONOSCOPY WITH PROPOFOL N/A 06/07/2022    Procedure: COLONOSCOPY WITH PROPOFOL;  Surgeon: Toney Reil, MD;  Location: [facility] ENDOSCOPY;  Service: Gastroenterology;  Laterality: N/A;   ENTEROSCOPY  06/07/2022   Procedure: ENTEROSCOPY;  Surgeon: Toney Reil, MD;  Location: [facility] ENDOSCOPY;  Service: Gastroenterology;;   ESOPHAGOGASTRODUODENOSCOPY (EGD) WITH PROPOFOL N/A 06/07/2022   Procedure: ESOPHAGOGASTRODUODENOSCOPY (EGD) WITH PROPOFOL;  Surgeon: Toney Reil, MD;  Location: [facility] ENDOSCOPY;  Service: Gastroenterology;  Laterality: N/A;   GASTRIC BYPASS  01/17/2000   KNEE SURGERY Right    knee trauma x3   prostate seeding     REVERSE SHOULDER ARTHROPLASTY Right 09/27/2021   Procedure: Right reverse shoulder arthroplasty, biceps tenodesis;  Surgeon: Signa Kell, MD;  Location: [facility];  Service: Orthopedics;  Laterality: Right;   SHOULDER ARTHROSCOPY WITH ROTATOR CUFF REPAIR AND OPEN BICEPS TENODESIS Left 12/27/2021   Procedure: Left shoulder arthroscopic cuff repair (supraspinatus and subscapularis) with Regeneten Patch application;  Surgeon: Signa Kell, MD;  Location: [facility];  Service: Orthopedics;  Laterality: Left;   Patient Active Problem List   Diagnosis Date Noted   Symptomatic anemia 04/26/2022   Chronic pain of both knees 03/30/2022   Bilateral primary osteoarthritis of knee 03/30/2022   Shortness of breath 03/24/2022   Vitamin D deficiency 02/24/2022   S/p reverse total shoulder arthroplasty 09/27/2021   Lesion of bone of lumbosacral spine (L5) 05/12/2021   Localized osteoarthritis of shoulder regions, bilateral 03/29/2021   Chronic pain of both shoulders 03/29/2021   Drug-induced myopathy 02/21/2021   Trigger finger of right hand 11/15/2020   Prostate cancer (HCC) 07/26/2020   Insomnia 08/01/2019   Primary osteoarthritis of both wrists 02/17/2019   Trigger middle finger of left hand 02/17/2019   Chronic pain syndrome 02/17/2019   Chronic radicular lumbar pain 10/08/2018   Lumbar  radiculopathy 10/08/2018   Lumbar degenerative disc disease 10/08/2018   Lumbar facet arthropathy 10/08/2018   Lumbar facet joint syndrome 10/08/2018   Intervertebral disc disorder with radiculopathy of lumbosacral region    Osteoarthritis of knee 11/30/2017   Osteoarthritis of wrist 11/30/2017   Pernicious anemia 05/22/2016   Persistent atrial fibrillation (HCC) 12/20/2015   Carpal tunnel syndrome 11/16/2014   DJD (degenerative joint disease) of knee 10/27/2014   Bilateral carpal tunnel syndrome 10/27/2014   Morbid obesity with BMI of 50.0-59.9, adult (HCC) 10/27/2014   Mixed hyperlipidemia 10/27/2014   H/O gastric bypass 03/20/2014   Hyperkalemia 03/20/2014   History of prostate cancer 12/26/2013   Essential hypertension 12/26/2013   Encounter for anticoagulation discussion and counseling 12/26/2013   PCP: Smitty Cords, DO  REFERRING PROVIDER: Signa Kell, MD  REFERRING DIAGNOSIS: Incomplete tear of left rotator cuff, unspecified whether traumatic M75.112   THERAPY DIAG: Chronic left shoulder pain  Chronic right shoulder pain  Muscle weakness (generalized)  RATIONALE FOR EVALUATION AND TREATMENT: Rehabilitation  ONSET DATE: Surgery 12/27/21,  Chronic L shoulder pain for multiple years  FOLLOW UP APPT WITH PROVIDER: Yes    FROM INITIAL EVALUATION SUBJECTIVE:                                                                                                                                                                                         Chief Complaint: S/p L shoulder RTC repair, biceps tenodesis, and shoulder debridement  Pertinent History Pt with history of chronic bilateral shoulder pain. He underwent right reverse TSR and right biceps tenodesis on 09/27/21. He experienced post-operative instability in R shoulder however it improved and he was able to avoid a revision. On 12/27/21 he underwent left arthroscopic rotator cuff repair (subscapularis  (full-thickness), supraspinatus (partial-thickness) side-to-side repair + Regeneten patch), left arthroscopic biceps tenodesis, and left extensive debridement of shoulder (glenohumeral and subacromial spaces). Per surgical note other intraoperative findings include Type 2 SLAP tear, degenerative anterior labral tearing, grade 2 degenerative changes to articular cartilage of humeral head, and significant posterior capsule synovitis. No significant post-operative complications. He arrives without abduction pillow and states that Dr. Allena Katz advised him he doesn't have to wear the abduction pillow. He got behind on his pain medication today and states that today his shoulder has been the most painful he has experienced since surgery. He also noticed swelling in L lateral upper arm this morning. He did take his pain medication at 14:00, before PT evaluation.  He has a history of chronic back pain and underwent a lumbar facet, medial branch radiofrequency ablation on 01/02/22. At the time of the evaluation pt has not yet appreciated improvement in his back pain from this procedure. He has a past medical history of Cardiomyopathy (in setting of Afib), DJD (degenerative joint disease) of knee, History of kidney stones, Intervertebral disc disorder with radiculopathy of lumbosacral region, Kidney stone (2012), Lower extremity edema, PAF (paroxysmal atrial fibrillation) (HCC), Pernicious anemia, Prostate cancer (HCC), Pulmonary nodule, right, and SVT (supraventricular tachycardia) (HCC). He also  has a past surgical history that includes Knee surgery; Gastric bypass (2002); prostate seeding; Carpal tunnel release (Right, 12/23/2014); Carpal tunnel release (Left, 01/06/2015); Colonoscopy with propofol (N/A, 10/08/2017); and Cardioversion (N/A, 05/15/2019).   Pain:  Pain Intensity: Present: 5/10, Best: 3/10, Worst: 10/10 Pain location: Anterior L shoulder Pain Quality: aching and throbbing Radiating: No  Numbness/Tingling:  No Focal Weakness: Yes History of prior shoulder or neck/shoulder injury, pain, surgery, or therapy: Yes, chronic bilateral shoulder pain with R TSR. 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 No AROM or WB through LUE (per protocol), Passive elevation and ER in staged ROM to begin at week 4 (elevation 90 degrees weeks 4-6 with no ER; 0-120 weeks 6-8 with ER 0-30 degrees). No pulleys or cane until after 6 weeks, recommend no driving x 6-8 weeks.   Weight Bearing Restrictions: Yes No WB through LUE for at least 6 weeks  Living Environment Lives with: lives with their spouse Lives in: House/apartment  Patient Goals: Improve L shoulder strength and pain. Pt would like to be able to play with/lift his grandson as well as take care of his property.  OBJECTIVE:   Patient Surveys  FOTO: To be completed QuickDASH: 47.7%  Cognition Patient is oriented to person, place, and time.  Recent memory is intact.  Remote memory is intact.  Attention span and concentration are intact.  Expressive speech is intact.  Patient's fund of knowledge is within normal limits for educational level.    Gross Musculoskeletal Assessment Tremor: None Bulk: Mild swelling noted in L lateral upper arm. Pt denies chills, fevers, redness, or dischrage from inicisions Tone: Normal  Posture Forward head and rounded shoulders  AROM Full L wrist AROM flexion, extension, radial deviation, and ulnar deviation. MCP flexion  and extension WNL. Unable to assess L shoulder and elbow AROM per protocol  PROM Full L elbow PROM flexion and extension however during first attempt at slow passive extension pt reports sudden pain in L shoulder which eventually improve after a couple minutes of rest. Full L elbow PROM pronation/supination.   LE MMT: Full L wrist strength for flexion, extension, radial deviation, and ulnar deviation. L grip strength is intact and strong. Deferred testing of L elbow and shoulder strength per protocol  Sensation Grossly intact to light touch throughout LUE as determined by testing dermatomes C2-T2. Proprioception and hot/cold testing deferred on this date.  Palpation Pt is generally tender to light palpation around anterior, lateral, and posterior L shoulder;    TODAY'S TREATMENT    SUBJECTIVE: Pt reports his R shoulder is doing alright however his L shoulder continues to be painful. It is better than it was earlier this week when he came for therapy. He has an appointment to see Dr. Allena Katz on 06/29/22. No specific questions currently.    PAIN: L shoulder pain reported upon arrival   Ther-ex  All exercises performed sitting fully upright today unless otherwise specified: R shoulder flexion from neutral to 150 degrees with 5# dumbbell (DB) 2 x 8; R shoulder scaption from neutral to around 110 degrees with 5# DB 2 x 6; R shoulder overhead press with 5# DB 2 x 10; R shoulder internal rotation from forearm vertical to stomach with manual resistance from therapist 2 x 10; R shoulder external rotation from stomach to forearm vertical with manual resistance from therapist 2 x 10; R elbow flexion with 5# DB 2 x 10; R elbow manually resisted extension 2 x 10; L shoulder AROM flexion with straight elbow from neutral to around 130 degrees with no weight x 10; L shoulder AROM scaption with straight elbow from neutral to around 110 degrees with no weight, x 10; L shoulder overhead press with no  weight x 10; L shoulder IR from vertical to stomach  with light manual resistance x 10, no pain reported; L shoulder ER from stomach to vertical with light manual resistance x 10, no pain reported;  L elbow manually resisted flexion x 10; L elbow manually resisted extension x 10;   PATIENT EDUCATION:  Education details:  Pt educated throughout session about proper posture and technique with exercises. Improved exercise technique, movement at target joints, use of target muscles after min to mod verbal, visual, tactile cues. Person educated: Patient Education method: Explanation, verbal cues Education comprehension: verbalized understanding and returned demonstration   HOME EXERCISE PROGRAM: Access Code: [email or web address] URL: [email or web address] Date: 03/28/2022 Prepared by: Ria Comment  Exercises - Wrist Flexion AROM  - 1 x daily - 7 x weekly - 2 sets - 10 reps - 3s hold - Wrist Extension AROM  - 1 x daily - 7 x weekly - 2 sets - 10 reps - 3s hold - Seated Scapular Retraction  - 1 x daily - 7 x weekly - 2 sets - 10 reps - 3s hold - Standing Elbow Flexion Extension AROM  - 1 x daily - 7 x weekly - 2 sets - 10 reps - 3s hold - Standing Shoulder Flexion to 90 Degrees  - 1 x daily - 7 x weekly - 2 sets - 10 reps - 3s hold - Shoulder Abduction - Thumbs Up  - 1 x daily - 7 x weekly - 2 sets - 10 reps - 3s hold   ASSESSMENT:  CLINICAL IMPRESSION: Pt reporting continuation of his L shoulder pain and weakness. Progressed R shoulder strengthening by increasing dumbbell to 5# however he reps decreased. Continued L shoulder AROM without resistance/weight. L shoulder scaption is the weakest and most painful direction. Significant concern for re-tear of L RTC. Pt encouraged to continue HEP and follow-up as scheduled. He has a follow-up appointment with Dr. Allena Katz next week. Plan to progress LUE per protocol as pt is able to tolerate in addition to progressing resistance with RUE  strengthening against gravity. He will benefit from skilled PT to address deficits in range of motion and strength in order to improve overall function at home and with leisure activities such as caring for his property.  REHAB POTENTIAL: Good  CLINICAL DECISION MAKING: Evolving/moderate complexity  EVALUATION COMPLEXITY: Moderate   GOALS: Goals reviewed with patient? Yes  SHORT TERM GOALS: Target date: 05/09/2022   Pt will be independent with HEP to improve strength and decrease right shoulder pain to improve pain-free function at home and work. Baseline:  Goal status: ACHIEVED   LONG TERM GOALS: Target date: 09/12/2022   Pt will increase L shoulder FOTO to at least 51 to demonstrate significant improvement in function at home and with leisure activities related to L shoulder. Baseline: 01/03/22: To be completed; 01/24/22: 4; 02/28/22: 59; 04/11/22: 55; 06/15/22: 51 Goal status: ACHIEVED  2.  Pt will decrease worst L shoulder pain by at least 3 points on the NPRS in order to demonstrate clinically significant reduction in R shoulder pain. Baseline: 01/03/22: worst: 10/10; 02/28/22: 10/10; 04/11/22: 4/10; 06/15/22: 9/10; Goal status: ONGOING  3.  Pt will decrease quick DASH score by at least 8% in order to demonstrate clinically significant reduction in disability related to L shoulder pain        Baseline: 01/03/22: 47.7%; 02/28/22: 65.91%; 04/11/22: 56.8%; 06/15/22: 56.8% Goal status: ONGOING  4. Pt will demonstrate L shoulder flexion, abduction, IR, and ER strength of at least 4/5 in order to demonstrate improvement in strength and  function       Baseline: 01/03/22: Unable to test per protocol; 02/28/22: At least 3/5 but unable to fully test due to protocol; 04/11/22: flexion: 4/5, abduction: 4-/5 (IR and ER at least 3/5 but not tested); 06/15/22: flexion: 4/5 (painful), abduction: 4-/5 (painful), IR (seated): 5/5, ER (seated): 4+/5 (painful); Goal status: PARTIALLY MET  PLAN: PT FREQUENCY:  1-2x/week  PT DURATION: 12 weeks  PLANNED INTERVENTIONS: Therapeutic exercises, Therapeutic activity, Neuromuscular re-education, Balance training, Gait training, Patient/Family education, Joint manipulation, Joint mobilization, Vestibular training, Canalith repositioning, Aquatic Therapy, Dry Needling, Electrical stimulation, Spinal manipulation, Spinal mobilization, Cryotherapy, Moist heat, Traction, Ultrasound, Ionotophoresis 4mg /ml Dexamethasone, and Manual therapy  PLAN FOR NEXT SESSION: Progress L shoulder ROM and strength per protocol, continue R shoulder strengthening   Sharalyn Ink Kaydra Borgen PT, DPT, GCS  Physical Therapist- [place]  [facility]

## 2022-06-23 ENCOUNTER — Other Ambulatory Visit: Payer: Self-pay | Admitting: Family Medicine

## 2022-06-23 NOTE — Telephone Encounter (Signed)
Requested medications are due for refill today.  unsure  Requested medications are on the active medications list.  no  Last refill. 12/16/2020  Future visit scheduled.   yes  Notes to clinic.  Med not on med list.    Requested Prescriptions  Pending Prescriptions Disp Refills   fluticasone (FLONASE) 50 MCG/ACT nasal spray [Pharmacy Med Name: FLUTICASONE 50 MCG SPRAY] 16 mL 1    Sig: USE TWO SPRAYS IN THE AFFECTED NOSTRIL ONE TIME DAILY     Ear, Nose, and Throat: Nasal Preparations - Corticosteroids Passed - 06/23/2022 12:10 AM      Passed - Valid encounter within last 12 months    Recent Outpatient Visits           3 months ago Persistent atrial fibrillation [facility])   [place] [facility] Ross, Netta Neat, DO   4 months ago Left inguinal pain   [place] [facility] Smitty Cords, DO   6 months ago Anxiety   [place] [facility] Smitty Cords, DO   10 months ago Annual physical exam   [facility] Smitty Cords, DO   1 year ago Lumbar degenerative disc disease   [place] [facility] Smitty Cords, DO       Future Appointments             In 1 week Duke Salvia, MD [facility] at [place]   In 1 month Althea Charon, Netta Neat, DO [place] [facility] [place], [place]   In 2 months Duke Salvia, MD [facility] at [facility]

## 2022-06-27 ENCOUNTER — Ambulatory Visit: Payer: Medicare Other

## 2022-06-27 DIAGNOSIS — M6281 Muscle weakness (generalized): Secondary | ICD-10-CM

## 2022-06-27 DIAGNOSIS — M25512 Pain in left shoulder: Secondary | ICD-10-CM | POA: Diagnosis not present

## 2022-06-27 DIAGNOSIS — M25511 Pain in right shoulder: Secondary | ICD-10-CM | POA: Diagnosis not present

## 2022-06-27 DIAGNOSIS — G8929 Other chronic pain: Secondary | ICD-10-CM

## 2022-06-27 NOTE — Therapy (Signed)
OUTPATIENT PHYSICAL THERAPY SHOULDER TREATMENT  Patient Name: [name] MRN: [id number] DOB:1955/05/04, 67 y.o., male Today's Date: 06/27/2022   PT End of Session - 06/27/22 1445     Visit Number 33    Number of Visits 73    Date for PT Re-Evaluation 09/12/22    Authorization Type [employer] Medicare    Authorization Time Period [employer] medicare 2024  [id number] on [place]    PT Start Time 1410    PT Stop Time 1445    PT Time Calculation (min) 35 min    Activity Tolerance Patient tolerated treatment well    Behavior During Therapy WFL for tasks assessed/performed             Past Medical History:  Diagnosis Date   Anxiety    Aortic atherosclerosis (HCC)    Cardiomyopathy (in setting of Afib)    a.) TTE 12/26/2013: EF 45-50%, mild ant and antsept HK. mild MR. Mod dil LA. nl RV fxn. Rhythm was Afib; b.) TTE 07/04/2019: EF 55%, mid-apical anteroseptal HK, mild MAC, mild Ao sclerosis, G2DD, RVSP 45.3   Chronic pain syndrome    a.) followed by pain management   Chronic, continuous use of opioids    a.) hydrocodone/APAP 7.5/325 mg; followed by pain management   Coronary artery disease 11/06/2014   a.) cCTA 11/06/2014: Ca score 224 (all in pLAD) -- 74th percentile for age/sex matched control   Diverticulosis    DJD (degenerative joint disease) of knee    History of hiatal hernia    History of kidney stones 2012   Hyperlipidemia    Hyperplastic colon polyp    Hypertension    Inguinal hernia    left   Internal hemorrhoids    Intervertebral disc disorder with radiculopathy of lumbosacral region    Long term current use FULL DOSE (325 mg) aspirin    Long term current use of amiodarone    Long term current use of antithrombotics/antiplatelets    a.) rivaroxaban   Lower extremity edema    Mixed hyperlipidemia    Myalgia due to statin    Osteoarthritis    PAF (paroxysmal atrial fibrillation) (HCC)    a.) CHA2DS2VASc = 4 (age, HTN, CHF, vascular disease history);  b.) s/p DCCV 03/13/2014  (200 J x1); c.) s/p DCCV 05/15/2019 (150 J x 1, 200 J x2); d.) rate/rhythm maintained on oral amiodarone + metoprolol succinate; chronically anticoagulated with rivaroxaban   Pernicious anemia    Prostate cancer (HCC)    Pulmonary nodule, right    a. 10/2014 Cardiac CTA: 7mm RLL nodule; b. 04/2015 CT Chest: stable 7mm RLL nodule. No new nodules; 02/2017 CTA Chest: stable, benign, 7mm RLL pulm nodule.   Sleep difficulties    a.) takes melatonin + trazodone PRN   SVT (supraventricular tachycardia)    Tubular adenoma of colon    Past Surgical History:  Procedure Laterality Date   BICEPT TENODESIS Right 09/27/2021   Procedure: Right reverse shoulder arthroplasty, biceps tenodesis;  Surgeon: Signa Kell, MD;  Location: [facility];  Service: Orthopedics;  Laterality: Right;   CARDIOVERSION N/A 05/15/2019   Procedure: CARDIOVERSION;  Surgeon: Antonieta Iba, MD;  Location: [facility];  Service: Cardiovascular;  Laterality: N/A;   CARDIOVERSION N/A 03/13/2014   Procedure: CARDIOVERSION; Location: [facility]; Surgeon: [name], MD   CARDIOVERSION N/A 03/24/2022   Procedure: CARDIOVERSION;  Surgeon: Antonieta Iba, MD;  Location: [facility];  Service: Cardiovascular;  Laterality: N/A;   CARDIOVERSION N/A 05/19/2022  Procedure: CARDIOVERSION;  Surgeon: Antonieta Iba, MD;  Location: [facility];  Service: Cardiovascular;  Laterality: N/A;   CARPAL TUNNEL RELEASE Right 12/23/2014   Procedure: CARPAL TUNNEL RELEASE;  Surgeon: Deeann Saint, MD;  Location: [facility];  Service: Orthopedics;  Laterality: Right;   CARPAL TUNNEL RELEASE Left 01/06/2015   Procedure: CARPAL TUNNEL RELEASE;  Surgeon: Deeann Saint, MD;  Location: [facility];  Service: Orthopedics;  Laterality: Left;   COLONOSCOPY WITH PROPOFOL N/A 10/08/2017   Procedure: COLONOSCOPY WITH PROPOFOL;  Surgeon: Toney Reil, MD;  Location: [facility];  Service: Gastroenterology;  Laterality: N/A;   COLONOSCOPY WITH PROPOFOL N/A 06/07/2022    Procedure: COLONOSCOPY WITH PROPOFOL;  Surgeon: Toney Reil, MD;  Location: [facility] END[facility]COPY;  Service: Gastroenterology;  Laterality: N/A;   ENTEROSCOPY  06/07/2022   Procedure: ENTEROSCOPY;  Surgeon: Toney Reil, MD;  Location: [facility] ENDOSCOPY;  Service: Gastroenterology;;   ESOPHAGOGASTRODUODENOSCOPY (EGD) WITH PROPOFOL N/A 06/07/2022   Procedure: ESOPHAGOGASTRODUODENOSCOPY (EGD) WITH PROPOFOL;  Surgeon: Toney Reil, MD;  Location: [facility] ENDOSCOPY;  Service: Gastroenterology;  Laterality: N/A;   GASTRIC BYPASS  01/17/2000   KNEE SURGERY Right    knee trauma x3   prostate seeding     REVERSE SHOULDER ARTHROPLASTY Right 09/27/2021   Procedure: Right reverse shoulder arthroplasty, biceps tenodesis;  Surgeon: Signa Kell, MD;  Location: [facility];  Service: Orthopedics;  Laterality: Right;   SHOULDER ARTHROSCOPY WITH ROTATOR CUFF REPAIR AND OPEN BICEPS TENODESIS Left 12/27/2021   Procedure: Left shoulder arthroscopic cuff repair (supraspinatus and subscapularis) with Regeneten Patch application;  Surgeon: Signa Kell, MD;  Location: [facility];  Service: Orthopedics;  Laterality: Left;   Patient Active Problem List   Diagnosis Date Noted   Symptomatic anemia 04/26/2022   Chronic pain of both knees 03/30/2022   Bilateral primary osteoarthritis of knee 03/30/2022   Shortness of breath 03/24/2022   Vitamin D deficiency 02/24/2022   S/p reverse total shoulder arthroplasty 09/27/2021   Lesion of bone of lumbosacral spine (L5) 05/12/2021   Localized osteoarthritis of shoulder regions, bilateral 03/29/2021   Chronic pain of both shoulders 03/29/2021   Drug-induced myopathy 02/21/2021   Trigger finger of right hand 11/15/2020   Prostate cancer (HCC) 07/26/2020   Insomnia 08/01/2019   Primary osteoarthritis of both wrists 02/17/2019   Trigger middle finger of left hand 02/17/2019   Chronic pain syndrome 02/17/2019   Chronic radicular lumbar pain 10/08/2018   Lumbar  radiculopathy 10/08/2018   Lumbar degenerative disc disease 10/08/2018   Lumbar facet arthropathy 10/08/2018   Lumbar facet joint syndrome 10/08/2018   Intervertebral disc disorder with radiculopathy of lumbosacral region    Osteoarthritis of knee 11/30/2017   Osteoarthritis of wrist 11/30/2017   Pernicious anemia 05/22/2016   Persistent atrial fibrillation (HCC) 12/20/2015   Carpal tunnel syndrome 11/16/2014   DJD (degenerative joint disease) of knee 10/27/2014   Bilateral carpal tunnel syndrome 10/27/2014   Morbid obesity with BMI of 50.0-59.9, adult (HCC) 10/27/2014   Mixed hyperlipidemia 10/27/2014   H/O gastric bypass 03/20/2014   Hyperkalemia 03/20/2014   History of prostate cancer 12/26/2013   Essential hypertension 12/26/2013   Encounter for anticoagulation discussion and counseling 12/26/2013   PCP: Smitty Cords, DO  REFERRING PROVIDER: Signa Kell, MD  REFERRING DIAGNOSIS: Incomplete tear of left rotator cuff, unspecified whether traumatic M75.112   THERAPY DIAG: Chronic left shoulder pain  Chronic right shoulder pain  Muscle weakness (generalized)  RATIONALE FOR EVALUATION AND TREATMENT: Rehabilitation  ONSET DATE: Surgery  12/27/21, Chronic L shoulder pain for multiple years  FOLLOW UP APPT WITH PROVIDER: Yes    FROM INITIAL EVALUATION SUBJECTIVE:                                                                                                                                                                                         Chief Complaint: S/p L shoulder RTC repair, biceps tenodesis, and shoulder debridement  Pertinent History Pt with history of chronic bilateral shoulder pain. He underwent right reverse TSR and right biceps tenodesis on 09/27/21. He experienced post-operative instability in R shoulder however it improved and he was able to avoid a revision. On 12/27/21 he underwent left arthroscopic rotator cuff repair (subscapularis  (full-thickness), supraspinatus (partial-thickness) side-to-side repair + Regeneten patch), left arthroscopic biceps tenodesis, and left extensive debridement of shoulder (glenohumeral and subacromial spaces). Per surgical note other intraoperative findings include Type 2 SLAP tear, degenerative anterior labral tearing, grade 2 degenerative changes to articular cartilage of humeral head, and significant posterior capsule synovitis. No significant post-operative complications. He arrives without abduction pillow and states that Dr. Allena Katz advised him he doesn't have to wear the abduction pillow. He got behind on his pain medication today and states that today his shoulder has been the most painful he has experienced since surgery. He also noticed swelling in L lateral upper arm this morning. He did take his pain medication at 14:00, before PT evaluation.  He has a history of chronic back pain and underwent a lumbar facet, medial branch radiofrequency ablation on 01/02/22. At the time of the evaluation pt has not yet appreciated improvement in his back pain from this procedure. He has a past medical history of Cardiomyopathy (in setting of Afib), DJD (degenerative joint disease) of knee, History of kidney stones, Intervertebral disc disorder with radiculopathy of lumbosacral region, Kidney stone (2012), Lower extremity edema, PAF (paroxysmal atrial fibrillation) (HCC), Pernicious anemia, Prostate cancer (HCC), Pulmonary nodule, right, and SVT (supraventricular tachycardia) (HCC). He also  has a past surgical history that includes Knee surgery; Gastric bypass (2002); prostate seeding; Carpal tunnel release (Right, 12/23/2014); Carpal tunnel release (Left, 01/06/2015); Colonoscopy with propofol (N/A, 10/08/2017); and Cardioversion (N/A, 05/15/2019).   Pain:  Pain Intensity: Present: 5/10, Best: 3/10, Worst: 10/10 Pain location: Anterior L shoulder Pain Quality: aching and throbbing Radiating: No  Numbness/Tingling:  No Focal Weakness: Yes History of prior shoulder or neck/shoulder injury, pain, surgery, or therapy: Yes, chronic bilateral shoulder pain with R TSR. 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 No AROM or WB through LUE (per protocol), Passive elevation and ER in staged ROM to begin at week 4 (elevation 90 degrees weeks 4-6 with no ER; 0-120 weeks 6-8 with ER 0-30 degrees). No pulleys or cane until after 6 weeks, recommend no driving x 6-8 weeks.   Weight Bearing Restrictions: Yes No WB through LUE for at least 6 weeks  Living Environment Lives with: lives with their spouse Lives in: House/apartment  Patient Goals: Improve L shoulder strength and pain. Pt would like to be able to play with/lift his grandson as well as take care of his property.  OBJECTIVE:   Patient Surveys  FOTO: To be completed QuickDASH: 47.7%  Cognition Patient is oriented to person, place, and time.  Recent memory is intact.  Remote memory is intact.  Attention span and concentration are intact.  Expressive speech is intact.  Patient's fund of knowledge is within normal limits for educational level.    Gross Musculoskeletal Assessment Tremor: None Bulk: Mild swelling noted in L lateral upper arm. Pt denies chills, fevers, redness, or dischrage from inicisions Tone: Normal  Posture Forward head and rounded shoulders  AROM Full L wrist AROM flexion, extension, radial deviation, and ulnar deviation. MCP flexion  and extension WNL. Unable to assess L shoulder and elbow AROM per protocol  PROM Full L elbow PROM flexion and extension however during first attempt at slow passive extension pt reports sudden pain in L shoulder which eventually improve after a couple minutes of rest. Full L elbow PROM pronation/supination.   LE MMT: Full L wrist strength for flexion, extension, radial deviation, and ulnar deviation. L grip strength is intact and strong. Deferred testing of L elbow and shoulder strength per protocol  Sensation Grossly intact to light touch throughout LUE as determined by testing dermatomes C2-T2. Proprioception and hot/cold testing deferred on this date.  Palpation Pt is generally tender to light palpation around anterior, lateral, and posterior L shoulder;    TODAY'S TREATMENT    SUBJECTIVE: Pt reports his R shoulder is doing alright. His L shoulder remains painful especially if he reaches out to the L side. However it is better than it was a couple weeks ago. He has an appointment to see Dr. Allena Katz on 06/29/22. No specific questions currently.    PAIN: L shoulder pain reported upon arrival   Ther-ex  All exercises performed sitting fully upright today unless otherwise specified: R shoulder flexion from neutral to 150 degrees with 5# dumbbell (DB) 2 x 10; R shoulder scaption from neutral to around 110 degrees with 5# DB 2 x 10; R shoulder overhead press with 5# DB 2 x 10; R shoulder internal rotation from forearm vertical to stomach with manual resistance from therapist 2 x 10; R shoulder external rotation from stomach to forearm vertical with manual resistance from therapist 2 x 10; R elbow flexion with 5# DB 2 x 10; R elbow manually resisted extension 2 x 10; L shoulder AROM flexion with straight elbow from neutral to around 130 degrees with no weight 2 x 10; L shoulder AROM scaption with straight elbow from neutral to around 110 degrees with no weight, 2 x 10; L shoulder  overhead press with no weight 2 x 10;  L shoulder IR from vertical to stomach with light manual resistance 2 x 10, no pain reported; L shoulder ER from stomach to vertical with light manual resistance 2 x 10, no pain reported;  L elbow manually resisted flexion 2 x 10; L elbow manually resisted extension 2 x 10; Seated chest press with cane and manual resistance 2 x 10; Seated rows with cane and manual resistance 2 x 10; Nautilus lat pull down 60# 2 x 10;   PATIENT EDUCATION:  Education details:  Pt educated throughout session about proper posture and technique with exercises. Improved exercise technique, movement at target joints, use of target muscles after min to mod verbal, visual, tactile cues. Person educated: Patient Education method: Explanation, verbal cues Education comprehension: verbalized understanding and returned demonstration   HOME EXERCISE PROGRAM: Access Code: [email or web address] URL: [email or web address] Date: 03/28/2022 Prepared by: Ria Comment  Exercises - Wrist Flexion AROM  - 1 x daily - 7 x weekly - 2 sets - 10 reps - 3s hold - Wrist Extension AROM  - 1 x daily - 7 x weekly - 2 sets - 10 reps - 3s hold - Seated Scapular Retraction  - 1 x daily - 7 x weekly - 2 sets - 10 reps - 3s hold - Standing Elbow Flexion Extension AROM  - 1 x daily - 7 x weekly - 2 sets - 10 reps - 3s hold - Standing Shoulder Flexion to 90 Degrees  - 1 x daily - 7 x weekly - 2 sets - 10 reps - 3s hold - Shoulder Abduction - Thumbs Up  - 1 x daily - 7 x weekly - 2 sets - 10 reps - 3s hold   ASSESSMENT:  CLINICAL IMPRESSION: Pt reporting continuation of his L shoulder pain and weakness. Progressed R shoulder strengthening by maintaining dumbbell at 5# and increasing reps. Continued L shoulder AROM without resistance/weight. Progressed to Nautilus lat pull downs. L shoulder scaption remains the weakest and most painful direction. Significant concern for re-tear of L RTC. Pt  encouraged to continue HEP and follow-up as scheduled. He has a follow-up appointment with Dr. Allena Katz this Thursday. Plan to progress LUE per protocol as pt is able to tolerate in addition to progressing resistance with RUE strengthening against gravity. He will benefit from skilled PT to address deficits in range of motion and strength in order to improve overall function at home and with leisure activities such as caring for his property.  REHAB POTENTIAL: Good  CLINICAL DECISION MAKING: Evolving/moderate complexity  EVALUATION COMPLEXITY: Moderate   GOALS: Goals reviewed with patient? Yes  SHORT TERM GOALS: Target date: 05/09/2022   Pt will be independent with HEP to improve strength and decrease right shoulder pain to improve pain-free function at home and work. Baseline:  Goal status: ACHIEVED   LONG TERM GOALS: Target date: 09/12/2022   Pt will increase L shoulder FOTO to at least 51 to demonstrate significant improvement in function at home and with leisure activities related to L shoulder. Baseline: 01/03/22: To be completed; 01/24/22: 4; 02/28/22: 59; 04/11/22: 55; 06/15/22: 51 Goal status: ACHIEVED  2.  Pt will decrease worst L shoulder pain by at least 3 points on the NPRS in order to demonstrate clinically significant reduction in R shoulder pain. Baseline: 01/03/22: worst: 10/10; 02/28/22: 10/10; 04/11/22: 4/10; 06/15/22: 9/10; Goal status: ONGOING  3.  Pt will decrease quick DASH score by at least 8% in order to demonstrate clinically significant reduction in disability related to L shoulder  pain        Baseline: 01/03/22: 47.7%; 02/28/22: 65.91%; 04/11/22: 56.8%; 06/15/22: 56.8% Goal status: ONGOING  4. Pt will demonstrate L shoulder flexion, abduction, IR, and ER strength of at least 4/5 in order to demonstrate improvement in strength and function       Baseline: 01/03/22: Unable to test per protocol; 02/28/22: At least 3/5 but unable to fully test due to protocol; 04/11/22:  flexion: 4/5, abduction: 4-/5 (IR and ER at least 3/5 but not tested); 06/15/22: flexion: 4/5 (painful), abduction: 4-/5 (painful), IR (seated): 5/5, ER (seated): 4+/5 (painful); Goal status: PARTIALLY MET  PLAN: PT FREQUENCY: 1-2x/week  PT DURATION: 12 weeks  PLANNED INTERVENTIONS: Therapeutic exercises, Therapeutic activity, Neuromuscular re-education, Balance training, Gait training, Patient/Family education, Joint manipulation, Joint mobilization, Vestibular training, Canalith repositioning, Aquatic Therapy, Dry Needling, Electrical stimulation, Spinal manipulation, Spinal mobilization, Cryotherapy, Moist heat, Traction, Ultrasound, Ionotophoresis 4mg /ml Dexamethasone, and Manual therapy  PLAN FOR NEXT SESSION: Progress L shoulder ROM and strength per protocol, continue R shoulder strengthening   Sharalyn Ink Masahiro Iglesia PT, DPT, GCS  Physical Therapist- [place]  [facility]

## 2022-06-28 ENCOUNTER — Ambulatory Visit
Payer: Medicare Other | Attending: Student in an Organized Health Care Education/Training Program | Admitting: Student in an Organized Health Care Education/Training Program

## 2022-06-28 ENCOUNTER — Encounter: Payer: Self-pay | Admitting: Student in an Organized Health Care Education/Training Program

## 2022-06-28 VITALS — BP 136/77 | HR 63 | Temp 97.2°F | Resp 16 | Ht 72.0 in | Wt 330.0 lb

## 2022-06-28 DIAGNOSIS — M47816 Spondylosis without myelopathy or radiculopathy, lumbar region: Secondary | ICD-10-CM | POA: Diagnosis not present

## 2022-06-28 DIAGNOSIS — M65332 Trigger finger, left middle finger: Secondary | ICD-10-CM | POA: Insufficient documentation

## 2022-06-28 DIAGNOSIS — G894 Chronic pain syndrome: Secondary | ICD-10-CM | POA: Insufficient documentation

## 2022-06-28 DIAGNOSIS — M65331 Trigger finger, right middle finger: Secondary | ICD-10-CM | POA: Insufficient documentation

## 2022-06-28 MED ORDER — LIDOCAINE HCL 2 % IJ SOLN
20.0000 mL | Freq: Once | INTRAMUSCULAR | Status: AC
  Administered 2022-06-28: 100 mg

## 2022-06-28 MED ORDER — LIDOCAINE HCL (PF) 2 % IJ SOLN
INTRAMUSCULAR | Status: AC
  Filled 2022-06-28: qty 5

## 2022-06-28 MED ORDER — LIDOCAINE HCL (PF) 1 % IJ SOLN: 5.0000 mL | Freq: Once | INTRAMUSCULAR | Status: DC

## 2022-06-28 MED ORDER — DEXAMETHASONE SODIUM PHOSPHATE 10 MG/ML IJ SOLN
10.0000 mg | Freq: Once | INTRAMUSCULAR | Status: AC
  Administered 2022-06-28: 10 mg

## 2022-06-28 MED ORDER — DEXAMETHASONE SODIUM PHOSPHATE 10 MG/ML IJ SOLN
INTRAMUSCULAR | Status: AC
  Filled 2022-06-28: qty 1

## 2022-06-28 NOTE — Progress Notes (Signed)
Safety precautions to be maintained throughout the outpatient stay will include: orient to surroundings, keep bed in low position, maintain call bell within reach at all times, provide assistance with transfer out of bed and ambulation.  

## 2022-06-28 NOTE — Progress Notes (Signed)
PROVIDER NOTE: Information contained herein reflects review and annotations entered in association with encounter. Interpretation of such information and data should be left to medically-trained personnel. Information provided to patient can be located elsewhere in the medical record under "Patient Instructions". Document created using STT-dictation technology, any transcriptional errors that may result from process are unintentional.    Patient: Blythe Stanford  Service Category: E/M  Provider: Edward Jolly, MD  DOB: [birth year]  DOS: 06/28/2022  Referring Provider: Saralyn Pilar *  MRN: [id number]  Specialty: Interventional Pain Management  PCP: Smitty Cords, DO  Type: Established Patient  Setting: Ambulatory outpatient    Location: Office  Delivery: Face-to-face     HPI  Mr. BERTEL KONEFAL, a 67 y.o. year old male, is here today because of his Trigger middle finger of left hand [M65.332]. Mr. Osada primary complain today is his trigger finger. He is also here for PPE eval.  Pertinent problems: Mr. Sangster has H/O gastric bypass; DJD (degenerative joint disease) of knee; Bilateral carpal tunnel syndrome; Persistent atrial fibrillation (HCC); Chronic radicular lumbar pain; Lumbar radiculopathy; Lumbar degenerative disc disease; Lumbar facet arthropathy; Lumbar facet joint syndrome; Primary osteoarthritis of both wrists; Trigger middle finger of left hand; and Chronic pain syndrome on their pertinent problem list. Pain Assessment: Severity of Chronic pain is reported as a 4 /10. Location: Back Lower/down left leg to behind left thigh; pain NO LONGER radiates down right leg. Onset: More than a month ago. Quality: Dull, Aching. Timing: Constant. Modifying factor(s): procedure. Vitals:  height is 6' (1.829 m) and weight is 330 lb (149.7 kg) (abnormal). His temporal temperature is 97.2 F (36.2 C) (abnormal). His blood pressure is 136/77 and his pulse is 63. His respiration is 16 and oxygen  saturation is 97%.  BMI: Estimated body mass index is 44.76 kg/m as calculated from the following:   Height as of this encounter: 6' (1.829 m).   Weight as of this encounter: 330 lb (149.7 kg). Last encounter: 03/30/2022. Last procedure: 05/01/2022.  Reason for encounter: post-procedure evaluation and assessment & bilateral trigger finger injection (Please see attached procedure note)   Post-procedure evaluation   Procedure: Lumbar Facet, Medial Branch Radiofrequency Ablation (RFA)  Laterality:  Bilateral   Level: L3, L4, and L5 Medial Branch Level(s). These levels will denervate the L3-4 and L4-5 lumbar facet joints.  Imaging: Fluoroscopy-guided         Anesthesia: Local anesthesia (1-2% Lidocaine) Anxiolysis:10 mg PO Valium DOS: 05/01/2022  Performed by: Edward Jolly, MD  Purpose: Therapeutic/Palliative Indications: Low back pain severe enough to impact quality of life or function. Indications: 1. Lumbar facet arthropathy   2. Chronic pain of both knees   3. Bilateral primary osteoarthritis of knee   4. Chronic pain syndrome    Mr. Jennison has been dealing with the above chronic pain for longer than three months and has either failed to respond, was unable to tolerate, or simply did not get enough benefit from other more conservative therapies including, but not limited to: 1. Over-the-counter medications 2. Anti-inflammatory medications 3. Muscle relaxants 4. Membrane stabilizers 5. Opioids 6. Physical therapy and/or chiropractic manipulation 7. Modalities (Heat, ice, etc.) 8. Invasive techniques such as nerve blocks. Mr. Allman has attained more than 50% relief of the pain from a series of diagnostic injections conducted in separate occasions.  Pain Score: Pre-procedure: 6  (knee 5/10)/10 Post-procedure: 0-No pain/10     Effectiveness:  Initial hour after procedure: 100 %  Subsequent 4-6 hours post-procedure:  100 %  Analgesia past initial 6 hours: 59 %  Ongoing  improvement:  Analgesic:  60-65% Function: Mr. Kyle reports improvement in function ROM: Mr. Hladky reports improvement in ROM   Pharmacotherapy Assessment  Analgesic:  hydrocodone 7.5 mg 3 times daily as needed     Monitoring: [place] PMP: PDMP reviewed during this encounter.       Pharmacotherapy: No side-effects or adverse reactions reported. Compliance: No problems identified. Effectiveness: Clinically acceptable.  Nonah Mattes, RN  06/28/2022  2:49 PM  Sign when Signing Visit Safety precautions to be maintained throughout the outpatient stay will include: orient to surroundings, keep bed in low position, maintain call bell within reach at all times, provide assistance with transfer out of bed and ambulation.     No results found for: "CBDTHCR" No results found for: "D8THCCBX" No results found for: "D9THCCBX"  UDS:  Summary  Date Value Ref Range Status  08/23/2021 Note  Final    Comment:    ==================================================================== [facility] ([facility]) ==================================================================== Test                             Result       Flag       Units  Drug Present and Declared for Prescription Verification   Hydrocodone                    4372         EXPECTED   ng/mg creat   Hydromorphone                  98           EXPECTED   ng/mg creat   Dihydrocodeine                 478          EXPECTED   ng/mg creat   Norhydrocodone                 >2488        EXPECTED   ng/mg creat    Sources of hydrocodone include scheduled prescription medications.    Hydromorphone, dihydrocodeine and norhydrocodone are expected    metabolites of hydrocodone. Hydromorphone and dihydrocodeine are    also available as scheduled prescription medications.  ==================================================================== Test                      Result    Flag   Units      Ref Range   Creatinine              201               mg/dL      >=16 ==================================================================== Declared Medications:  The flagging and interpretation on this report are based on the  following declared medications.  Unexpected results may arise from  inaccuracies in the declared medications.   **Note: The testing scope of this panel includes these medications:   Hydrocodone (Norco)   **Note: The testing scope of this panel does not include the  following reported medications:   Acetaminophen (Norco)  Amiodarone (Pacerone)  Amlodipine (Norvasc)  Aspirin  Benazepril (Lotensin)  Buspirone (Buspar)  Cyclobenzaprine (Flexeril)  Duloxetine (Cymbalta)  Eye Drops  Furosemide (Lasix)  Melatonin  Metoprolol (Toprol)  Multivitamin  Rivaroxaban (Xarelto)  Supplement  Tamsulosin (Flomax)  Trazodone (Desyrel)  Vitamin B12 ==================================================================== For clinical consultation,  please call [phone]. ====================================================================       ROS  Constitutional: Denies any fever or chills Gastrointestinal: No reported hemesis, hematochezia, vomiting, or acute GI distress Musculoskeletal:  bilateral trigger finger (middle finger) Neurological: No reported episodes of acute onset apraxia, aphasia, dysarthria, agnosia, amnesia, paralysis, loss of coordination, or loss of consciousness  Medication Review  Calcium-Magnesium-Vitamin D, Chia Seed, DULoxetine, HYDROcodone-acetaminophen, Vitamin D (Ergocalciferol), amiodarone, benazepril, busPIRone, carboxymethylcellulose, cyanocobalamin, cyclobenzaprine, ferrous sulfate, fluticasone, melatonin, metoprolol succinate, multivitamin with minerals, rivaroxaban, and traZODone  History Review  Allergy: Mr. Avent is allergic to bupivacaine liposome. Drug: Mr. Perkowski  reports no history of drug use. Alcohol:  reports that he does not currently use alcohol. Tobacco:  reports that  he quit smoking about 43 years ago. His smoking use included cigarettes. He has a 10.00 pack-year smoking history. He has quit using smokeless tobacco.  His smokeless tobacco use included snuff. Social: Mr. Fure  reports that he quit smoking about 43 years ago. His smoking use included cigarettes. He has a 10.00 pack-year smoking history. He has quit using smokeless tobacco.  His smokeless tobacco use included snuff. He reports that he does not currently use alcohol. He reports that he does not use drugs. Medical:  has a past medical history of Anxiety, Aortic atherosclerosis (HCC), Cardiomyopathy (in setting of Afib), Chronic pain syndrome, Chronic, continuous use of opioids, Coronary artery disease (11/06/2014), Diverticulosis, DJD (degenerative joint disease) of knee, History of hiatal hernia, History of kidney stones (2012), Hyperlipidemia, Hyperplastic colon polyp, Hypertension, Inguinal hernia, Internal hemorrhoids, Intervertebral disc disorder with radiculopathy of lumbosacral region, Long term current use FULL DOSE (325 mg) aspirin, Long term current use of amiodarone, Long term current use of antithrombotics/antiplatelets, Lower extremity edema, Mixed hyperlipidemia, Myalgia due to statin, Osteoarthritis, PAF (paroxysmal atrial fibrillation) (HCC), Pernicious anemia, Prostate cancer (HCC), Pulmonary nodule, right, Sleep difficulties, SVT (supraventricular tachycardia), and Tubular adenoma of colon. Surgical: Mr. Galante  has a past surgical history that includes Knee surgery (Right); Gastric bypass (01/17/2000); prostate seeding; Carpal tunnel release (Right, 12/23/2014); Carpal tunnel release (Left, 01/06/2015); Colonoscopy with propofol (N/A, 10/08/2017); Cardioversion (N/A, 05/15/2019); Cardioversion (N/A, 03/13/2014); Reverse shoulder arthroplasty (Right, 09/27/2021); Bicept tenodesis (Right, 09/27/2021); Shoulder arthroscopy with rotator cuff repair and open biceps tenodesis (Left, 12/27/2021);  Cardioversion (N/A, 03/24/2022); Cardioversion (N/A, 05/19/2022); Colonoscopy with propofol (N/A, 06/07/2022); Esophagogastroduodenoscopy (egd) with propofol (N/A, 06/07/2022); and enteroscopy (06/07/2022). Family: family history includes Arrhythmia in his brother and sister; Cancer in his brother, father, and sister; Heart disease in his father; Hypertension in his father; Stroke in his father.  Laboratory Chemistry Profile   Renal Lab Results  Component Value Date   BUN 16 05/15/2022   CREATININE 1.07 05/15/2022   BCR NOT APPLICABLE 07/27/2021   GFRAA 72 07/21/2020   GFRNONAA >60 05/15/2022    Hepatic Lab Results  Component Value Date   AST 19 04/26/2022   ALT 13 04/26/2022   ALBUMIN 4.1 04/26/2022   ALKPHOS 68 04/26/2022   LIPASE 28 03/13/2017    Electrolytes Lab Results  Component Value Date   NA 140 05/15/2022   K 4.4 05/15/2022   CL 107 05/15/2022   CALCIUM 8.5 (L) 05/15/2022   MG 2.0 02/01/2021    Bone Lab Results  Component Value Date   25OHVITD1 32 02/01/2021   25OHVITD2 <1.0 02/01/2021   25OHVITD3 32 02/01/2021    Inflammation (CRP: Acute Phase) (ESR: Chronic Phase) Lab Results  Component Value Date   CRP 1.7 02/09/2022   ESRSEDRATE 6 02/09/2022  Note: Above Lab results reviewed.  Recent Imaging Review  ECHOCARDIOGRAM COMPLETE    ECHOCARDIOGRAM REPORT       Patient Name:   ANGELL HONSE Date of Exam: 06/02/2022 Medical Rec #:  [id number]    Height:       72.0 in Accession #:    [id number]   Weight:       333.0 lb Date of Birth:  07/23/1955    BSA:          2.646 m Patient Age:    66 years     BP:           126/83 mmHg Patient Gender: M            HR:           54-72 bpm. Exam Location:  [facility]  Procedure: 2D Echo, Cardiac Doppler and Color Doppler  Indications:    I48.0 Paroxysmal atrial fibrillation   History:        Patient has prior history of Echocardiogram examinations, most                 recent 07/04/2019. Pulmonary HTN,  Arrythmias:Atrial Fibrillation,                 Signs/Symptoms:Dizziness/Lightheadedness and Shortness of                 Breath; Risk Factors:Hypertension, Dyslipidemia, Former Smoker                 and Family History of Coronary Artery Disease.   Sonographer:    Quentin Ore RDMS, RVT, RDCS Referring Phys: [id number]  IMPRESSIONS   1. Left ventricular ejection fraction, by estimation, is 55 to 60%. Left ventricular ejection fraction by 2D MOD biplane is 58.3 %. The left ventricle has normal function. The left ventricle has no regional wall motion abnormalities. Left ventricular  diastolic parameters are indeterminate.  2. Right ventricular systolic function is normal. The right ventricular size is normal. There is mildly elevated pulmonary artery systolic pressure. The estimated right ventricular systolic pressure is 37.9 mmHg.  3. Left atrial size was severely dilated.  4. The mitral valve is normal in structure. Mild to moderate mitral valve regurgitation. No evidence of mitral stenosis.  5. The aortic valve has an indeterminant number of cusps. Aortic valve regurgitation is mild. Aortic valve sclerosis is present, with no evidence of aortic valve stenosis.  6. There is borderline dilatation of the ascending aorta, measuring 38 mm.  7. The inferior vena cava is normal in size with greater than 50% respiratory variability, suggesting right atrial pressure of 3 mmHg.  FINDINGS  Left Ventricle: Left ventricular ejection fraction, by estimation, is 55 to 60%. Left ventricular ejection fraction by 2D MOD biplane is 58.3 %. The left ventricle has normal function. The left ventricle has no regional wall motion abnormalities. The  left ventricular internal cavity size was normal in size. There is no left ventricular hypertrophy. Left ventricular diastolic parameters are indeterminate.  Right Ventricle: The right ventricular size is normal. No increase in right ventricular wall thickness.  Right ventricular systolic function is normal. There is mildly elevated pulmonary artery systolic pressure. The tricuspid regurgitant velocity is 2.87  m/s, and with an assumed right atrial pressure of 5 mmHg, the estimated right ventricular systolic pressure is 37.9 mmHg.  Left Atrium: Left atrial size was severely dilated.  Right Atrium: Right atrial size was normal in size.  Pericardium: There is  no evidence of pericardial effusion.  Mitral Valve: The mitral valve is normal in structure. Mild to moderate mitral valve regurgitation. No evidence of mitral valve stenosis.  Tricuspid Valve: The tricuspid valve is normal in structure. Tricuspid valve regurgitation is mild . No evidence of tricuspid stenosis.  Aortic Valve: The aortic valve has an indeterminant number of cusps. Aortic valve regurgitation is mild. Aortic valve sclerosis is present, with no evidence of aortic valve stenosis. Aortic valve mean gradient measures 2.0 mmHg. Aortic valve peak  gradient measures 3.4 mmHg. Aortic valve area, by VTI measures 3.78 cm.  Pulmonic Valve: The pulmonic valve was normal in structure. Pulmonic valve regurgitation is not visualized. No evidence of pulmonic stenosis.  Aorta: The aortic root is normal in size and structure. There is borderline dilatation of the ascending aorta, measuring 38 mm.  Venous: The inferior vena cava is normal in size with greater than 50% respiratory variability, suggesting right atrial pressure of 3 mmHg.  IAS/Shunts: No atrial level shunt detected by color flow Doppler.    LEFT VENTRICLE PLAX 2D                        Biplane EF (MOD) LVIDd:         5.00 cm         LV Biplane EF:   Left LVIDs:         3.30 cm                          ventricular LV PW:         1.00 cm                          ejection LV IVS:        1.10 cm                          fraction by LVOT diam:     2.40 cm                          2D MOD LV SV:         78                                biplane is LV SV Index:   29                               58.3 %. LVOT Area:     4.52 cm   LV Volumes (MOD) LV vol d, MOD    129.0 ml A2C: LV vol d, MOD    146.0 ml A4C: LV vol s, MOD    53.7 ml A2C: LV vol s, MOD    63.8 ml A4C: LV SV MOD A2C:   75.3 ml LV SV MOD A4C:   146.0 ml LV SV MOD BP:    82.2 ml  RIGHT VENTRICLE             IVC RV Basal diam:  4.50 cm     IVC diam: 1.60 cm RV Mid diam:    4.60 cm RV S prime:     13.80 cm/s TAPSE (M-mode): 2.1 cm  LEFT ATRIUM  Index        RIGHT ATRIUM           Index LA diam:        6.65 cm  2.51 cm/m   RA Area:     23.90 cm LA Vol (A2C):   106.0 ml 40.06 ml/m  RA Volume:   78.40 ml  29.63 ml/m LA Vol (A4C):   121.0 ml 45.73 ml/m LA Biplane Vol: 125.0 ml 47.24 ml/m  AORTIC VALVE                    PULMONIC VALVE AV Area (Vmax):    4.25 cm     PV Vmax:       0.82 m/s AV Area (Vmean):   4.09 cm     PV Peak grad:  2.7 mmHg AV Area (VTI):     3.78 cm AV Vmax:           91.97 cm/s AV Vmean:          63.500 cm/s AV VTI:            0.205 m AV Peak Grad:      3.4 mmHg AV Mean Grad:      2.0 mmHg LVOT Vmax:         86.43 cm/s LVOT Vmean:        57.467 cm/s LVOT VTI:          0.172 m LVOT/AV VTI ratio: 0.84   AORTA Ao Root diam: 3.80 cm Ao Asc diam:  3.70 cm Ao Arch diam: 3.2 cm  TRICUSPID VALVE TR Peak grad:   32.9 mmHg TR Vmax:        287.00 cm/s   SHUNTS Systemic VTI:  0.17 m Systemic Diam: 2.40 cm  [name] MD Electronically signed by [name] MD Signature Date/Time: 06/03/2022/9:03:14 AM      Final   Note: Reviewed        Physical Exam  General appearance: Well nourished, well developed, and well hydrated. In no apparent acute distress Mental status: Alert, oriented x 3 (person, place, & time)       Respiratory: No evidence of acute respiratory distress Eyes: PERLA Vitals: BP 136/77   Pulse 63   Temp (!) 97.2 F (36.2 C) (Temporal)   Resp 16   Ht 6' (1.829 m)   Wt (!) 330  lb (149.7 kg)   SpO2 97%   BMI 44.76 kg/m  BMI: Estimated body mass index is 44.76 kg/m as calculated from the following:   Height as of this encounter: 6' (1.829 m).   Weight as of this encounter: 330 lb (149.7 kg). Ideal: Ideal body weight: 77.6 kg (171 lb 1.2 oz) Adjusted ideal body weight: 106.4 kg (234 lb 10.3 oz)  Improvement in low back pain, improved range of motion Bilateral middle finger trigger finger  Assessment   Diagnosis Status  1. Trigger middle finger of left hand   2. Trigger middle finger of right hand   3. Lumbar facet arthropathy   4. Lumbar facet joint syndrome   5. Chronic pain syndrome    Having a Flare-up Having a Flare-up Controlled   Updated Problems: No problems updated.  Plan of Care  Patient states that his low back pain is significantly better after his bilateral L3, L4, L5 facet medial branch RFA.  He states that he is able to ambulate longer distance without having as much pain. He is complaining of bilateral middle finger  trigger finger at the A2 and A4 pulley.  He is requesting a bilateral trigger finger injection.  We have checked with our insurance specialist and it will be possible for [place] to offer him that today.  Please see attached procedure note.    Pharmacotherapy (Medications Ordered): Meds ordered this encounter  Medications   dexamethasone (DECADRON) injection 10 mg   lidocaine (XYLOCAINE) 2 % (with pres) injection 400 mg   lidocaine (PF) (XYLOCAINE) 1 % injection 5 mL   Orders:  No orders of the defined types were placed in this encounter.  Follow-up plan:   Return for Keep sch. appt.      s/p lesi #1 (left L4/5) on 8/17, #2 on 10/21/2018 (left L3/4), #3 on 12/09/2018 (Left L3/4); 02/24/2019: Right wrist injection and left trigger finger (middle finger) injection.,  Bilateral wrist injection 10/15/2019, bilateral knee Hyalgan No. 1 01/26/1960. #2 02/23/2020, #3 03/24/2020.  Endorsing approximately 75 to 80% pain relief for  bilateral knee pain.  Lumbar L3-5 RFA, Left: 02/16/21, Right 03/09/21, 01/02/22           Recent Visits Date Type Provider Dept  05/01/22 Procedure visit Edward Jolly, MD [facility]  03/30/22 Office Visit Edward Jolly, MD [facility]  Showing recent visits within past 90 days and meeting all other requirements Today's Visits Date Type Provider Dept  06/28/22 Office Visit Edward Jolly, MD [facility]  Showing today's visits and meeting all other requirements Future Appointments Date Type Provider Dept  08/15/22 Appointment Edward Jolly, MD [facility]  Showing future appointments within next 90 days and meeting all other requirements  I discussed the assessment and treatment plan with the patient. The patient was provided an opportunity to ask questions and all were answered. The patient agreed with the plan and demonstrated an understanding of the instructions.  Patient advised to call back or seek an in-person evaluation if the symptoms or condition worsens.  Duration of encounter: 20 minutes.  Total time on encounter, as per AMA guidelines included both the face-to-face and non-face-to-face time personally spent by the physician and/or other qualified health care professional(s) on the day of the encounter (includes time in activities that require the physician or other qualified health care professional and does not include time in activities normally performed by clinical staff). Physician's time may include the following activities when performed: Preparing to see the patient (e.g., pre-charting review of records, searching for previously ordered imaging, lab work, and nerve conduction tests) Review of prior analgesic pharmacotherapies. Reviewing PMP Interpreting ordered tests (e.g., lab work, imaging, nerve conduction tests) Performing post-procedure evaluations, including interpretation of diagnostic procedures Obtaining and/or reviewing  separately obtained history Performing a medically appropriate examination and/or evaluation Counseling and educating the patient/family/caregiver Ordering medications, tests, or procedures Referring and communicating with other health care professionals (when not separately reported) Documenting clinical information in the electronic or other health record Independently interpreting results (not separately reported) and communicating results to the patient/ family/caregiver Care coordination (not separately reported)  Note by: Edward Jolly, MD Date: 06/28/2022; Time: 3:42 PM

## 2022-06-28 NOTE — Progress Notes (Signed)
PROVIDER NOTE: Interpretation of information contained herein should be left to medically-trained personnel. Specific patient instructions are provided elsewhere under "Patient Instructions" section of medical record. This document was created in part using STT-dictation technology, any transcriptional errors that may result from this process are unintentional.  Patient: [name] Type: Established DOB: [birth year] MRN: [id number] PCP: Smitty Cords, DO  Service: Procedure DOS: 06/28/2022 Setting: Ambulatory Location: Ambulatory outpatient facility Delivery: Face-to-face Provider: Edward Jolly, MD Specialty: Interventional Pain Management Specialty designation: 09 Location: Outpatient facility Ref. Prov.: Saralyn Pilar *       Interventional Therapy   Primary Reason for Visit: Interventional Pain Management Treatment. CC: Back Pain    Procedure:          Anesthesia, Analgesia, Anxiolysis:  Type: Trigger Finger Ligament/Tendon sheath (20550) Injection.           Purpose: Diagnostic Target Area: Flexor Digitorum Tendon sheath nodule Region: A-2 and A-4 pulley of the metacarpal area Approach: Percutaneous Digit: No:3(Middle) Finger Laterality: Bilateral  Type: Local Anesthesia Local Anesthetic: Lidocaine 1-2% Sedation: None  Indication(s):  Analgesia Route: Infiltration ([place]/IM) IV Access: N/A   Position: Sitting   1. Trigger middle finger of left hand   2. Trigger middle finger of right hand   3. Lumbar facet arthropathy   4. Lumbar facet joint syndrome   5. Chronic pain syndrome    NAS-11 Pain score:   Pre-procedure: 4 /10   Post-procedure: 4 /10     Pre-op H&P Assessment:  [name] is a 67 y.o. (year old), male patient, seen today for interventional treatment. He  has a past surgical history that includes Knee surgery (Right); Gastric bypass (01/17/2000); prostate seeding; Carpal tunnel release (Right, 12/23/2014); Carpal tunnel release (Left,  01/06/2015); Colonoscopy with propofol (N/A, 10/08/2017); Cardioversion (N/A, 05/15/2019); Cardioversion (N/A, 03/13/2014); Reverse shoulder arthroplasty (Right, 09/27/2021); Bicept tenodesis (Right, 09/27/2021); Shoulder arthroscopy with rotator cuff repair and open biceps tenodesis (Left, 12/27/2021); Cardioversion (N/A, 03/24/2022); Cardioversion (N/A, 05/19/2022); Colonoscopy with propofol (N/A, 06/07/2022); Esophagogastroduodenoscopy (egd) with propofol (N/A, 06/07/2022); and enteroscopy (06/07/2022). [name] has a current medication list which includes the following prescription(s): amiodarone, benazepril, buspirone, opurity calcium citrate plus, carboxymethylcellulose, chia seed, cyanocobalamin, cyclobenzaprine, duloxetine, ferrous sulfate, fluticasone, hydrocodone-acetaminophen, [START ON 07/20/2022] hydrocodone-acetaminophen, melatonin, metoprolol succinate, multivitamin with minerals, xarelto, trazodone, and vitamin d (ergocalciferol), and the following Facility-Administered Medications: lidocaine (pf). His primarily concern today is the Back Pain  Initial Vital Signs:  Pulse/HCG Rate: (!) 58  Temp: (!) 97.2 F (36.2 C) Resp: 16 BP: (!) 136/98 SpO2: 99 %  BMI: Estimated body mass index is 44.76 kg/m as calculated from the following:   Height as of this encounter: 6' (1.829 m).   Weight as of this encounter: 330 lb (149.7 kg).  Risk Assessment: Allergies: Reviewed. He is allergic to bupivacaine liposome.  Allergy Precautions: None required Coagulopathies: Reviewed. None identified.  Blood-thinner therapy: None at this time Active Infection(s): Reviewed. None identified. [name] is afebrile  Site Confirmation: [name] was asked to confirm the procedure and laterality before marking the site Procedure checklist: Completed Consent: Before the procedure and under the influence of no sedative(s), amnesic(s), or anxiolytics, the patient was informed of the treatment options, risks and  possible complications. To fulfill our ethical and legal obligations, as recommended by the American Medical Association's Code of Ethics, I have informed the patient of my clinical impression; the nature and purpose of the treatment or procedure; the risks, benefits, and possible complications of the intervention;  the alternatives, including doing nothing; the risk(s) and benefit(s) of the alternative treatment(s) or procedure(s); and the risk(s) and benefit(s) of doing nothing. The patient was provided information about the general risks and possible complications associated with the procedure. These may include, but are not limited to: failure to achieve desired goals, infection, bleeding, organ or nerve damage, allergic reactions, paralysis, and death. In addition, the patient was informed of those risks and complications associated to the procedure, such as failure to decrease pain; infection; bleeding; organ or nerve damage with subsequent damage to sensory, motor, and/or autonomic systems, resulting in permanent pain, numbness, and/or weakness of one or several areas of the body; allergic reactions; (i.e.: anaphylactic reaction); and/or death. Furthermore, the patient was informed of those risks and complications associated with the medications. These include, but are not limited to: allergic reactions (i.e.: anaphylactic or anaphylactoid reaction(s)); adrenal axis suppression; blood sugar elevation that in diabetics may result in ketoacidosis or comma; water retention that in patients with history of congestive heart failure may result in shortness of breath, pulmonary edema, and decompensation with resultant heart failure; weight gain; swelling or edema; medication-induced neural toxicity; particulate matter embolism and blood vessel occlusion with resultant organ, and/or nervous system infarction; and/or aseptic necrosis of one or more joints. Finally, the patient was informed that Medicine is not an  exact science; therefore, there is also the possibility of unforeseen or unpredictable risks and/or possible complications that may result in a catastrophic outcome. The patient indicated having understood very clearly. We have given the patient no guarantees and we have made no promises. Enough time was given to the patient to ask questions, all of which were answered to the patient's satisfaction. Mr. Tiet has indicated that he wanted to continue with the procedure. Attestation: I, the ordering provider, attest that I have discussed with the patient the benefits, risks, side-effects, alternatives, likelihood of achieving goals, and potential problems during recovery for the procedure that I have provided informed consent. Date  Time: 06/28/2022  2:32 PM  Pre-Procedure Preparation:  Monitoring: As per clinic protocol. Respiration, ETCO2, SpO2, BP, heart rate and rhythm monitor placed and checked for adequate function Safety Precautions: Patient was assessed for positional comfort and pressure points before starting the procedure. Time-out: I initiated and conducted the "Time-out" before starting the procedure, as per protocol. The patient was asked to participate by confirming the accuracy of the "Time Out" information. Verification of the correct person, site, and procedure were performed and confirmed by me, the nursing staff, and the patient. "Time-out" conducted as per Joint Commission's Universal Protocol (UP.01.01.01). Time: 1517 Start Time: 1517 hrs.  Description of Procedure:          Area Prepped: Entire palmar and dorsal aspect of hand, up to forearm area. DuraPrep (Iodine Povacrylex [0.7% available iodine] and Isopropyl Alcohol, 74% w/w) Safety Precautions: Aspiration looking for blood return was conducted prior to all injections. At no point did we inject any substances, as a needle was being advanced. No attempts were made at seeking any paresthesias. Safe injection practices and needle  disposal techniques used. Medications properly checked for expiration dates. SDV (single dose vial) medications used. Description of the Procedure: Protocol guidelines were followed. The patient was placed in position. The target area was identified and prepped in the usual manner. Skin & deeper tissues infiltrated with local anesthetic. Appropriate time provided for local anesthetics to take effect. The procedure needle was slowly advanced to target area. Proper needle placement secured. Negative aspiration  confirmed. Solution injected in intermittent fashion, asking for systemic symptoms every 0.5cc. Needle(s) removed and area cleaned, making sure to leave some prepping solution back to take advantage of its long term bactericidal properties.  Vitals:   06/28/22 1450 06/28/22 1523  BP: (!) 136/98 136/77  Pulse: (!) 58 63  Resp: 16 16  Temp: (!) 97.2 F (36.2 C) (!) 97.2 F (36.2 C)  TempSrc: Temporal Temporal  SpO2: 99% 97%  Weight: (!) 330 lb (149.7 kg)   Height: 6' (1.829 m)     Start Time: 1517 hrs. End Time: 1523 hrs. Materials:  Needle(s) Type: Regular needle Gauge: 25G Length: 1.5-in Medication(s): Please see orders for medications and dosing details.  4 cc solution made of 3 cc of 0.2% ropivacaine, 1 cc of Decadron 10 mg/cc.  1 cc injected for the A2 pulley, 1 cc injected for the A4 pulley for bilateral middle fingers.     Post-operative Assessment:  Post-procedure Vital Signs:  Pulse/HCG Rate: 63  Temp: (!) 97.2 F (36.2 C) Resp: 16 BP: 136/77 SpO2: 97 %  EBL: None  Complications: No immediate post-treatment complications observed by team, or reported by patient.  Note: The patient tolerated the entire procedure well. A repeat set of vitals were taken after the procedure and the patient was kept under observation following institutional policy, for this type of procedure. Post-procedural neurological assessment was performed, showing return to baseline, prior to  discharge. The patient was provided with post-procedure discharge instructions, including a section on how to identify potential problems. Should any problems arise concerning this procedure, the patient was given instructions to immediately contact us, at any time, without hesitation. In any case, we plan to contact the patient by telephone for a follow-up status report regarding this interventional procedure.  Comments:  No additional relevant information.  Plan of Care (POC)  Orders:  No orders of the defined types were placed in this encounter.  Chronic Opioid Analgesic:   hydrocodone 7.5 mg 3 times daily as needed     Medications ordered for procedure: Meds ordered this encounter  Medications   dexamethasone (DECADRON) injection 10 mg   lidocaine (XYLOCAINE) 2 % (with pres) injection 400 mg   lidocaine (PF) (XYLOCAINE) 1 % injection 5 mL   Medications administered: We administered dexamethasone and lidocaine.  See the medical record for exact dosing, route, and time of administration.  Follow-up plan:   Return for Keep sch. appt.       Recent Visits Date Type Provider Dept  05/01/22 Procedure visit Edward Jolly, MD [facility]  03/30/22 Office Visit Edward Jolly, MD [facility]  Showing recent visits within past 90 days and meeting all other requirements Today's Visits Date Type Provider Dept  06/28/22 Office Visit Edward Jolly, MD [facility]  Showing today's visits and meeting all other requirements Future Appointments Date Type Provider Dept  08/15/22 Appointment Edward Jolly, MD [facility]  Showing future appointments within next 90 days and meeting all other requirements  Disposition: Discharge home  Discharge (Date  Time): 06/28/2022; 1523 hrs.   Primary Care Physician: Smitty Cords, DO Location: [facility] Note by: Edward Jolly, MD (TTS technology used. I apologize for any  typographical errors that were not detected and corrected.) Date: 06/28/2022; Time: 3:41 PM  Disclaimer:  Medicine is not an Visual merchandiser. The only guarantee in medicine is that nothing is guaranteed. It is important to note that the decision to proceed with  this intervention was based on the information collected from the patient. The Data and conclusions were drawn from the patient's questionnaire, the interview, and the physical examination. Because the information was provided in large part by the patient, it cannot be guaranteed that it has not been purposely or unconsciously manipulated. Every effort has been made to obtain as much relevant data as possible for this evaluation. It is important to note that the conclusions that lead to this procedure are derived in large part from the available data. Always take into account that the treatment will also be dependent on availability of resources and existing treatment guidelines, considered by other Pain Management Practitioners as being common knowledge and practice, at the time of the intervention. For Medico-Legal purposes, it is also important to point out that variation in procedural techniques and pharmacological choices are the acceptable norm. The indications, contraindications, technique, and results of the above procedure should only be interpreted and judged by a Board-Certified Interventional Pain Specialist with extensive familiarity and expertise in the same exact procedure and technique.

## 2022-06-29 ENCOUNTER — Ambulatory Visit: Payer: Medicare Other

## 2022-06-29 DIAGNOSIS — M25511 Pain in right shoulder: Secondary | ICD-10-CM | POA: Diagnosis not present

## 2022-06-29 DIAGNOSIS — Z9889 Other specified postprocedural states: Secondary | ICD-10-CM | POA: Diagnosis not present

## 2022-06-29 DIAGNOSIS — T84028A Dislocation of other internal joint prosthesis, initial encounter: Secondary | ICD-10-CM | POA: Diagnosis not present

## 2022-06-29 DIAGNOSIS — M25519 Pain in unspecified shoulder: Secondary | ICD-10-CM | POA: Diagnosis not present

## 2022-06-29 DIAGNOSIS — Z96611 Presence of right artificial shoulder joint: Secondary | ICD-10-CM | POA: Diagnosis not present

## 2022-06-30 ENCOUNTER — Inpatient Hospital Stay (HOSPITAL_BASED_OUTPATIENT_CLINIC_OR_DEPARTMENT_OTHER): Payer: Medicare Other | Admitting: Internal Medicine

## 2022-06-30 ENCOUNTER — Inpatient Hospital Stay: Payer: Medicare Other

## 2022-06-30 ENCOUNTER — Inpatient Hospital Stay: Payer: Medicare Other | Attending: Internal Medicine

## 2022-06-30 VITALS — BP 134/85 | HR 84 | Temp 97.3°F | Ht 72.0 in | Wt 337.4 lb

## 2022-06-30 DIAGNOSIS — D649 Anemia, unspecified: Secondary | ICD-10-CM | POA: Insufficient documentation

## 2022-06-30 DIAGNOSIS — I2510 Atherosclerotic heart disease of native coronary artery without angina pectoris: Secondary | ICD-10-CM | POA: Insufficient documentation

## 2022-06-30 DIAGNOSIS — K649 Unspecified hemorrhoids: Secondary | ICD-10-CM | POA: Insufficient documentation

## 2022-06-30 DIAGNOSIS — Z7901 Long term (current) use of anticoagulants: Secondary | ICD-10-CM | POA: Diagnosis not present

## 2022-06-30 DIAGNOSIS — Z9884 Bariatric surgery status: Secondary | ICD-10-CM | POA: Diagnosis not present

## 2022-06-30 DIAGNOSIS — I4891 Unspecified atrial fibrillation: Secondary | ICD-10-CM | POA: Diagnosis not present

## 2022-06-30 DIAGNOSIS — Z87891 Personal history of nicotine dependence: Secondary | ICD-10-CM | POA: Diagnosis not present

## 2022-06-30 DIAGNOSIS — E538 Deficiency of other specified B group vitamins: Secondary | ICD-10-CM | POA: Diagnosis not present

## 2022-06-30 DIAGNOSIS — R5383 Other fatigue: Secondary | ICD-10-CM | POA: Insufficient documentation

## 2022-06-30 LAB — CBC WITH DIFFERENTIAL (CANCER CENTER ONLY)
Abs Immature Granulocytes: 0.14 10*3/uL — ABNORMAL HIGH (ref 0.00–0.07)
Basophils Absolute: 0 10*3/uL (ref 0.0–0.1)
Basophils Relative: 0 %
Eosinophils Absolute: 0 10*3/uL (ref 0.0–0.5)
Eosinophils Relative: 0 %
HCT: 42.4 % (ref 39.0–52.0)
Hemoglobin: 13.3 g/dL (ref 13.0–17.0)
Immature Granulocytes: 1 %
Lymphocytes Relative: 11 %
Lymphs Abs: 1.9 10*3/uL (ref 0.7–4.0)
MCH: 23.9 pg — ABNORMAL LOW (ref 26.0–34.0)
MCHC: 31.4 g/dL (ref 30.0–36.0)
MCV: 76.3 fL — ABNORMAL LOW (ref 80.0–100.0)
Monocytes Absolute: 1.6 10*3/uL — ABNORMAL HIGH (ref 0.1–1.0)
Monocytes Relative: 9 %
Neutro Abs: 13.6 10*3/uL — ABNORMAL HIGH (ref 1.7–7.7)
Neutrophils Relative %: 79 %
Platelet Count: 249 10*3/uL (ref 150–400)
RBC: 5.56 MIL/uL (ref 4.22–5.81)
RDW: 22.8 % — ABNORMAL HIGH (ref 11.5–15.5)
WBC Count: 17.2 10*3/uL — ABNORMAL HIGH (ref 4.0–10.5)
nRBC: 0 % (ref 0.0–0.2)

## 2022-06-30 MED ORDER — SODIUM CHLORIDE 0.9 % IV SOLN
Freq: Once | INTRAVENOUS | Status: AC
  Filled 2022-06-30: qty 250

## 2022-06-30 MED ORDER — SODIUM CHLORIDE 0.9 % IV SOLN
200.0000 mg | Freq: Once | INTRAVENOUS | Status: AC
  Administered 2022-06-30: 200 mg via INTRAVENOUS
  Filled 2022-06-30: qty 200

## 2022-06-30 NOTE — Progress Notes (Signed)
[facility] CONSULT NOTE  Patient Care Team: Smitty Cords, DO as PCP - General (Family Medicine) Antonieta Iba, MD as PCP - Cardiology (Cardiology) Duke Salvia, MD as PCP - Electrophysiology (Cardiology)  CHIEF COMPLAINTS/PURPOSE OF CONSULTATION: ANEMIA   HEMATOLOGY HISTORY  # ANEMIA[Hb; MCV-platelets- WBC; Iron sat; ferritin;  GFR- CT/US- ;  EGD- none/colonoscopy-2019 [Dr.Vanga]  # 2005- Gastric by pass [BL-400; 233; [facility]]  # Prostate cancer [seed implant]- no surgery/ Dr.Wolfe.    Latest Reference Range & Units 03/22/22 11:59  WBC 4.0 - 10.5 K/uL 7.4  RBC 4.22 - 5.81 MIL/uL 5.03  Hemoglobin 13.0 - 17.0 g/dL 16.1 (L)  HCT 09.6 - 04.5 % 35.1 (L)  MCV 80.0 - 100.0 fL 69.8 (L)  MCH 26.0 - 34.0 pg 20.5 (L)  MCHC 30.0 - 36.0 g/dL 40.9 (L)  RDW 81.1 - 91.4 % 17.8 (H)  Platelets 150 - 400 K/uL 250  (L): Data is abnormally low (H): Data is abnormally high  HISTORY OF PRESENTING ILLNESS:  [name] 67 y.o.  male pleasant patient with Hx of prostate cancer; and history of symptomatic anemia s/p gastric bypass here for follow-up of anemia.  Patient s/p Venofer. Noted to have improvement of energy levels.   In the interim patient underwent  s/p EGD/Colo/ SBE -no obvious source of bleeding noted.  Patient continues to have intermittent blood in his stool for the past three days-attributed to his history of hemorrhoid.    Review of Systems  Constitutional:  Positive for malaise/fatigue. Negative for chills, diaphoresis, fever and weight loss.  HENT:  Negative for nosebleeds and sore throat.   Eyes:  Negative for double vision.  Respiratory:  Positive for shortness of breath. Negative for cough, hemoptysis, sputum production and wheezing.   Cardiovascular:  Negative for chest pain, palpitations, orthopnea and leg swelling.  Gastrointestinal:  Positive for blood in stool and diarrhea. Negative for abdominal pain, constipation, heartburn, melena,  nausea and vomiting.  Genitourinary:  Negative for dysuria, frequency and urgency.  Musculoskeletal:  Positive for back pain and joint pain.  Skin: Negative.  Negative for itching and rash.  Neurological:  Negative for dizziness, tingling, focal weakness, weakness and headaches.  Endo/Heme/Allergies:  Does not bruise/bleed easily.  Psychiatric/Behavioral:  Negative for depression. The patient is not nervous/anxious and does not have insomnia.     MEDICAL HISTORY:  Past Medical History:  Diagnosis Date   Anxiety    Aortic atherosclerosis (HCC)    Cardiomyopathy (in setting of Afib)    a.) TTE 12/26/2013: EF 45-50%, mild ant and antsept HK. mild MR. Mod dil LA. nl RV fxn. Rhythm was Afib; b.) TTE 07/04/2019: EF 55%, mid-apical anteroseptal HK, mild MAC, mild Ao sclerosis, G2DD, RVSP 45.3   Chronic pain syndrome    a.) followed by pain management   Chronic, continuous use of opioids    a.) hydrocodone/APAP 7.5/325 mg; followed by pain management   Coronary artery disease 11/06/2014   a.) cCTA 11/06/2014: Ca score 224 (all in pLAD) -- 74th percentile for age/sex matched control   Diverticulosis    DJD (degenerative joint disease) of knee    History of hiatal hernia    History of kidney stones 2012   Hyperlipidemia    Hyperplastic colon polyp    Hypertension    Inguinal hernia    left   Internal hemorrhoids    Intervertebral disc disorder with radiculopathy of lumbosacral region    Long term current use FULL  DOSE (325 mg) aspirin    Long term current use of amiodarone    Long term current use of antithrombotics/antiplatelets    a.) rivaroxaban   Lower extremity edema    Mixed hyperlipidemia    Myalgia due to statin    Osteoarthritis    PAF (paroxysmal atrial fibrillation) (HCC)    a.) CHA2DS2VASc = 4 (age, HTN, CHF, vascular disease history);  b.) s/p DCCV 03/13/2014 (200 J x1); c.) s/p DCCV 05/15/2019 (150 J x 1, 200 J x2); d.) rate/rhythm maintained on oral amiodarone +  metoprolol succinate; chronically anticoagulated with rivaroxaban   Pernicious anemia    Prostate cancer (HCC)    Pulmonary nodule, right    a. 10/2014 Cardiac CTA: 7mm RLL nodule; b. 04/2015 CT Chest: stable 7mm RLL nodule. No new nodules; 02/2017 CTA Chest: stable, benign, 7mm RLL pulm nodule.   Sleep difficulties    a.) takes melatonin + trazodone PRN   SVT (supraventricular tachycardia)    Tubular adenoma of colon     SURGICAL HISTORY: Past Surgical History:  Procedure Laterality Date   BICEPT TENODESIS Right 09/27/2021   Procedure: Right reverse shoulder arthroplasty, biceps tenodesis;  Surgeon: Signa Kell, MD;  Location: [facility] ORS;  Service: Orthopedics;  Laterality: Right;   CARDIOVERSION N/A 05/15/2019   Procedure: CARDIOVERSION;  Surgeon: Antonieta Iba, MD;  Location: [facility];  Service: Cardiovascular;  Laterality: N/A;   CARDIOVERSION N/A 03/13/2014   Procedure: CARDIOVERSION; Location: [facility]; Surgeon: [name], MD   CARDIOVERSION N/A 03/24/2022   Procedure: CARDIOVERSION;  Surgeon: Antonieta Iba, MD;  Location: [facility];  Service: Cardiovascular;  Laterality: N/A;   CARDIOVERSION N/A 05/19/2022   Procedure: CARDIOVERSION;  Surgeon: Antonieta Iba, MD;  Location: [facility];  Service: Cardiovascular;  Laterality: N/A;   CARPAL TUNNEL RELEASE Right 12/23/2014   Procedure: CARPAL TUNNEL RELEASE;  Surgeon: Deeann Saint, MD;  Location: [facility];  Service: Orthopedics;  Laterality: Right;   CARPAL TUNNEL RELEASE Left 01/06/2015   Procedure: CARPAL TUNNEL RELEASE;  Surgeon: Deeann Saint, MD;  Location: [facility];  Service: Orthopedics;  Laterality: Left;   COLONOSCOPY WITH PROPOFOL N/A 10/08/2017   Procedure: COLONOSCOPY WITH PROPOFOL;  Surgeon: Toney Reil, MD;  Location[facility]COPY;  Service: Gastroenterology;  Laterality: N/A;   COLONOSCOPY WITH PROPOFOL N/A 06/07/2022   Procedure: COLONOSCOPY WITH PROPOFOL;  Surgeon: Toney Reil, MD;  Location:  [facility] ENDOSCOPY;  Service: Gastroenterology;  Laterality: N/A;   ENTEROSCOPY  06/07/2022   Procedure: ENTEROSCOPY;  Surgeon: Toney Reil, MD;  Location: [facility]COPY;  Service: Gastroenterology;;   ESOPHAGOGASTRODUODENOSCOPY (EGD) WITH PROPOFOL N/A 06/07/2022   Procedure: ESOPHAGOGASTRODUODENOSCOPY (EGD) WITH PROPOFOL;  Surgeon: Toney Reil, MD;  Location: [facility] ENDOSCOPY;  Service: Gastroenterology;  Laterality: N/A;   GASTRIC BYPASS  01/17/2000   KNEE SURGERY Right    knee trauma x3   prostate seeding     REVERSE SHOULDER ARTHROPLASTY Right 09/27/2021   Procedure: Right reverse shoulder arthroplasty, biceps tenodesis;  Surgeon: Signa Kell, MD;  Location: [facility] ORS;  Service: Orthopedics;  Laterality: Right;   SHOULDER ARTHROSCOPY WITH ROTATOR CUFF REPAIR AND OPEN BICEPS TENODESIS Left 12/27/2021   Procedure: Left shoulder arthroscopic cuff repair (supraspinatus and subscapularis) with Regeneten Patch application;  Surgeon: Signa Kell, MD;  Location: [facility];  Service: Orthopedics;  Laterality: Left;    SOCIAL HISTORY: Social History   Socioeconomic History   Marital status: Married    Spouse name: Nelva Bush   Number of children: Not  on file   Years of education: Not on file   Highest education level: Not on file  Occupational History   Not on file  Tobacco Use   Smoking status: Former    Packs/day: 1.00    Years: 10.00    Additional pack years: 0.00    Total pack years: 10.00    Types: Cigarettes    Quit date: 02/18/1979    Years since quitting: 43.3   Smokeless tobacco: Former    Types: Snuff  Vaping Use   Vaping Use: Never used  Substance and Sexual Activity   Alcohol use: Not Currently    Comment: last drink in 2021   Drug use: No   Sexual activity: Not on file  Other Topics Concern   Not on file  Social History Narrative   Lives in [place]; with wife. Retd from [employer]; quit in 1980; used to drink alcohol; no beer in last 2022.    Social  Determinants of Health   Financial Resource Strain: Low Risk  (02/24/2022)   Overall Financial Resource Strain (CARDIA)    Difficulty of Paying Living Expenses: Not hard at all  Food Insecurity: No Food Insecurity (04/26/2022)   Hunger Vital Sign    Worried About Running Out of Food in the Last Year: Never true    Ran Out of Food in the Last Year: Never true  Transportation Needs: No Transportation Needs (02/24/2022)   PRAPARE - Administrator, Civil Service (Medical): No    Lack of Transportation (Non-Medical): No  Physical Activity: Insufficiently Active (02/24/2022)   Exercise Vital Sign    Days of Exercise per Week: 2 days    Minutes of Exercise per Session: 60 min  Stress: No Stress Concern Present (02/24/2022)   [employer] of Occupational Health - Occupational Stress Questionnaire    Feeling of Stress : Not at all  Social Connections: Moderately Integrated (02/24/2022)   Social Connection and Isolation Panel [NHANES]    Frequency of Communication with Friends and Family: More than three times a week    Frequency of Social Gatherings with Friends and Family: More than three times a week    Attends Religious Services: More than 4 times per year    Active Member of [employer] or Organizations: No    Attends Banker Meetings: Never    Marital Status: Married  Catering manager Violence: Not At Risk (04/26/2022)   Humiliation, Afraid, Rape, and Kick questionnaire    Fear of Current or Ex-Partner: No    Emotionally Abused: No    Physically Abused: No    Sexually Abused: No    FAMILY HISTORY: Family History  Problem Relation Age of Onset   Heart disease Father    Hypertension Father    Stroke Father    Cancer Father        prostate cancer   Cancer Sister    Arrhythmia Sister        A-fib   Arrhythmia Brother        A-fib   Cancer Brother     ALLERGIES:  is allergic to bupivacaine liposome.  MEDICATIONS:  Current Outpatient Medications  Medication  Sig Dispense Refill   amiodarone (PACERONE) 200 MG tablet Take 2 tablets (400 mg) by mouth twice a day x 2 weeks 56 tablet 0   benazepril (LOTENSIN) 40 MG tablet TAKE ONE TABLET BY MOUTH ONE TIME DAILY (Patient taking differently: Take 40 mg by mouth every morning. TAKE ONE  TABLET BY MOUTH ONE TIME DAILY) 90 tablet 3   busPIRone (BUSPAR) 10 MG tablet TAKE ONE TABLET BY MOUTH FOUR TIMES A DAY AS NEEDED FOR ANXIETY 360 tablet 1   Calcium-Magnesium-Vitamin D (OPURITY CALCIUM CITRATE PLUS) 300-20-200 MG-MG-UNIT CHEW Chew 2 tablets by mouth daily. Silver leaf brand 300 Mg supplement     carboxymethylcellulose (REFRESH PLUS) 0.5 % SOLN 1 drop 3 (three) times daily as needed.     CHIA SEED PO Take 2 capsules by mouth daily. 750 mg supplement  2 by mouth in the morning.     cyanocobalamin (VITAMIN B12) 1000 MCG/ML injection Inject 1 mL (1,000 mcg total) into the skin every 30 (thirty) days. INJECT EVERY 30 DAYS AS DIRECTED 1 mL 99   cyclobenzaprine (FLEXERIL) 10 MG tablet TAKE ONE TABLET BY MOUTH THREE TIMES A DAY AS NEEDED FOR MUSCLE SPASM 90 tablet 3   DULoxetine (CYMBALTA) 30 MG capsule TAKE ONE CAPSULE BY MOUTH ONE TIME DAILY 90 capsule 1   ferrous sulfate 325 (65 FE) MG EC tablet Take 325 mg by mouth 3 (three) times daily with meals.     fluticasone (FLONASE) 50 MCG/ACT nasal spray USE TWO SPRAYS IN THE AFFECTED NOSTRIL ONE TIME DAILY 16 mL 1   HYDROcodone-acetaminophen (NORCO) 7.5-325 MG tablet Take 1 tablet by mouth every 6 (six) hours as needed for severe pain. Must last 30 days 120 tablet 0   [START ON 07/20/2022] HYDROcodone-acetaminophen (NORCO) 7.5-325 MG tablet Take 1 tablet by mouth every 6 (six) hours as needed for severe pain. Must last 30 days 120 tablet 0   melatonin 3 MG TABS tablet Take 3 mg by mouth at bedtime.     metoprolol succinate (TOPROL-XL) 50 MG 24 hr tablet TAKE ONE TABLET BY MOUTH ONE TIME DAILY WITH OR IMMEDIATELY FOLLOWING A MEAL 90 tablet 2   Multiple Vitamin (MULTIVITAMIN  WITH MINERALS) TABS tablet Take 1 tablet by mouth daily.     rivaroxaban (XARELTO) 20 MG TABS tablet TAKE ONE TABLET BY MOUTH ONE TIME DAILY WITH SUPPER 90 tablet 2   traZODone (DESYREL) 150 MG tablet TAKE ONE TABLET BY MOUTH AT BEDTIME 90 tablet 1   Vitamin D, Ergocalciferol, (DRISDOL) 1.25 MG (50000 UNIT) CAPS capsule Take 50,000 Units by mouth once a week.     No current facility-administered medications for this visit.     PHYSICAL EXAMINATION:   Vitals:   06/30/22 1440  BP: 134/85  Pulse: 84  Temp: (!) 97.3 F (36.3 C)  SpO2: 96%   Filed Weights   06/30/22 1440  Weight: (!) 337 lb 6.4 oz (153 kg)    Physical Exam Vitals and nursing note reviewed.  HENT:     Head: Normocephalic and atraumatic.     Mouth/Throat:     Pharynx: Oropharynx is clear.  Eyes:     Extraocular Movements: Extraocular movements intact.     Pupils: Pupils are equal, round, and reactive to light.  Cardiovascular:     Rate and Rhythm: Normal rate and regular rhythm.  Pulmonary:     Comments: Decreased breath sounds bilaterally.  Abdominal:     Palpations: Abdomen is soft.  Musculoskeletal:        General: Normal range of motion.     Cervical back: Normal range of motion.  Skin:    General: Skin is warm.  Neurological:     General: No focal deficit present.     Mental Status: He is alert and oriented to person, place,  and time.  Psychiatric:        Behavior: Behavior normal.        Judgment: Judgment normal.      LABORATORY DATA:  I have reviewed the data as listed Lab Results  Component Value Date   WBC 17.2 (H) 06/30/2022   HGB 13.3 06/30/2022   HCT 42.4 06/30/2022   MCV 76.3 (L) 06/30/2022   PLT 249 06/30/2022   Recent Labs    07/27/21 0801 09/21/21 1113 03/22/22 1159 04/26/22 1400 05/15/22 1459  NA 142   < > 139 138 140  K 5.0   < > 4.4 4.4 4.4  CL 107   < > 109 109 107  CO2 27   < > 24 24 25   GLUCOSE 98   < > 113* 163* 112*  BUN 15   < > 14 18 16   CREATININE 1.13    < > 0.97 1.13 1.07  CALCIUM 9.3   < > 8.7* 8.6* 8.5*  GFRNONAA  --    < > >60 >60 >60  PROT 6.3  --   --  6.8  --   ALBUMIN  --   --   --  4.1  --   AST 12  --   --  19  --   ALT 10  --   --  13  --   ALKPHOS  --   --   --  68  --   BILITOT 0.4  --   --  0.5  --    < > = values in this interval not displayed.     ECHOCARDIOGRAM COMPLETE  Result Date: 06/03/2022    ECHOCARDIOGRAM REPORT   Patient Name:   [name] Date of Exam: 06/02/2022 Medical Rec #:  [id number]    Height:       72.0 in Accession #:    [id number]   Weight:       333.0 lb Date of Birth:  Nov 12, 1955    BSA:          2.646 m Patient Age:    66 years     BP:           126/83 mmHg Patient Gender: M            HR:           54-72 bpm. Exam Location:  [facility] Procedure: 2D Echo, Cardiac Doppler and Color Doppler Indications:    I48.0 Paroxysmal atrial fibrillation  History:        Patient has prior history of Echocardiogram examinations, most                 recent 07/04/2019. Pulmonary HTN, Arrythmias:Atrial Fibrillation,                 Signs/Symptoms:Dizziness/Lightheadedness and Shortness of                 Breath; Risk Factors:Hypertension, Dyslipidemia, Former Smoker                 and Family History of Coronary Artery Disease.  Sonographer:    Quentin Ore RDMS, RVT, RDCS Referring Phys: [id number] Duke Salvia IMPRESSIONS  1. Left ventricular ejection fraction, by estimation, is 55 to 60%. Left ventricular ejection fraction by 2D MOD biplane is 58.3 %. The left ventricle has normal function. The left ventricle has no regional wall motion abnormalities. Left ventricular diastolic parameters are indeterminate.  2. Right ventricular systolic function is  normal. The right ventricular size is normal. There is mildly elevated pulmonary artery systolic pressure. The estimated right ventricular systolic pressure is 37.9 mmHg.  3. Left atrial size was severely dilated.  4. The mitral valve is normal in structure. Mild to moderate mitral  valve regurgitation. No evidence of mitral stenosis.  5. The aortic valve has an indeterminant number of cusps. Aortic valve regurgitation is mild. Aortic valve sclerosis is present, with no evidence of aortic valve stenosis.  6. There is borderline dilatation of the ascending aorta, measuring 38 mm.  7. The inferior vena cava is normal in size with greater than 50% respiratory variability, suggesting right atrial pressure of 3 mmHg. FINDINGS  Left Ventricle: Left ventricular ejection fraction, by estimation, is 55 to 60%. Left ventricular ejection fraction by 2D MOD biplane is 58.3 %. The left ventricle has normal function. The left ventricle has no regional wall motion abnormalities. The left ventricular internal cavity size was normal in size. There is no left ventricular hypertrophy. Left ventricular diastolic parameters are indeterminate. Right Ventricle: The right ventricular size is normal. No increase in right ventricular wall thickness. Right ventricular systolic function is normal. There is mildly elevated pulmonary artery systolic pressure. The tricuspid regurgitant velocity is 2.87  m/s, and with an assumed right atrial pressure of 5 mmHg, the estimated right ventricular systolic pressure is 37.9 mmHg. Left Atrium: Left atrial size was severely dilated. Right Atrium: Right atrial size was normal in size. Pericardium: There is no evidence of pericardial effusion. Mitral Valve: The mitral valve is normal in structure. Mild to moderate mitral valve regurgitation. No evidence of mitral valve stenosis. Tricuspid Valve: The tricuspid valve is normal in structure. Tricuspid valve regurgitation is mild . No evidence of tricuspid stenosis. Aortic Valve: The aortic valve has an indeterminant number of cusps. Aortic valve regurgitation is mild. Aortic valve sclerosis is present, with no evidence of aortic valve stenosis. Aortic valve mean gradient measures 2.0 mmHg. Aortic valve peak gradient measures 3.4 mmHg.  Aortic valve area, by VTI measures 3.78 cm. Pulmonic Valve: The pulmonic valve was normal in structure. Pulmonic valve regurgitation is not visualized. No evidence of pulmonic stenosis. Aorta: The aortic root is normal in size and structure. There is borderline dilatation of the ascending aorta, measuring 38 mm. Venous: The inferior vena cava is normal in size with greater than 50% respiratory variability, suggesting right atrial pressure of 3 mmHg. IAS/Shunts: No atrial level shunt detected by color flow Doppler.  LEFT VENTRICLE PLAX 2D                        Biplane EF (MOD) LVIDd:         5.00 cm         LV Biplane EF:   Left LVIDs:         3.30 cm                          ventricular LV PW:         1.00 cm                          ejection LV IVS:        1.10 cm                          fraction by LVOT diam:  2.40 cm                          2D MOD LV SV:         78                               biplane is LV SV Index:   29                               58.3 %. LVOT Area:     4.52 cm  LV Volumes (MOD) LV vol d, MOD    129.0 ml A2C: LV vol d, MOD    146.0 ml A4C: LV vol s, MOD    53.7 ml A2C: LV vol s, MOD    63.8 ml A4C: LV SV MOD A2C:   75.3 ml LV SV MOD A4C:   146.0 ml LV SV MOD BP:    82.2 ml RIGHT VENTRICLE             IVC RV Basal diam:  4.50 cm     IVC diam: 1.60 cm RV Mid diam:    4.60 cm RV S prime:     13.80 cm/s TAPSE (M-mode): 2.1 cm LEFT ATRIUM              Index        RIGHT ATRIUM           Index LA diam:        6.65 cm  2.51 cm/m   RA Area:     23.90 cm LA Vol (A2C):   106.0 ml 40.06 ml/m  RA Volume:   78.40 ml  29.63 ml/m LA Vol (A4C):   121.0 ml 45.73 ml/m LA Biplane Vol: 125.0 ml 47.24 ml/m  AORTIC VALVE                    PULMONIC VALVE AV Area (Vmax):    4.25 cm     PV Vmax:       0.82 m/s AV Area (Vmean):   4.09 cm     PV Peak grad:  2.7 mmHg AV Area (VTI):     3.78 cm AV Vmax:           91.97 cm/s AV Vmean:          63.500 cm/s AV VTI:            0.205 m AV Peak Grad:       3.4 mmHg AV Mean Grad:      2.0 mmHg LVOT Vmax:         86.43 cm/s LVOT Vmean:        57.467 cm/s LVOT VTI:          0.172 m LVOT/AV VTI ratio: 0.84  AORTA Ao Root diam: 3.80 cm Ao Asc diam:  3.70 cm Ao Arch diam: 3.2 cm TRICUSPID VALVE TR Peak grad:   32.9 mmHg TR Vmax:        287.00 cm/s  SHUNTS Systemic VTI:  0.17 m Systemic Diam: 2.40 cm [name] MD Electronically signed by [name] MD Signature Date/Time: 06/03/2022/9:03:14 AM    Final     ASSESSMENT & PLAN:   Symptomatic anemia # Anemia- Hb-symptomatic.  Likely due to iron deficiency -likely secondary to malabsorption/Hx of gastric bypass.  Status post IV iron infusion. Recommend barimelt+ iron [over-the-counter medication/can buy online].  2 a day- under the tongue. Proceed with venofer today.  #Etiology of iron deficiency: Malabsorption/gastric bypass. Incidental CT February 2024-  possible mucosal lesion at the distal small bowel anastomosis status post gastric bypass. S/p  EGD/colo/ SBE [Dr.Vanga]-  stable.   # B12 def [PCP]- home b12 injections-   # CAD/A.fib-on Xarelto/amiodarone.  Monitor closely- ?  Hemorrhoidal bleeding.   # DISPOSITION: # venofer today # follow up in 4  months- MD: labs- cbc;bmp; iron studies; ferritin; vit D 25-OH possible venofer-Dr.B  All questions were answered. The patient knows to call the clinic with any problems, questions or concerns.  Earna Coder, MD 06/30/2022 3:15 PM

## 2022-06-30 NOTE — Assessment & Plan Note (Addendum)
#   Anemia- Hb-symptomatic.  Likely due to iron deficiency -likely secondary to malabsorption/Hx of gastric bypass.  Status post IV iron infusion. Recommend barimelt+ iron [over-the-counter medication/can buy online].  2 a day- under the tongue. Proceed with venofer today.  #Etiology of iron deficiency: Malabsorption/gastric bypass. Incidental CT February 2024-  possible mucosal lesion at the distal small bowel anastomosis status post gastric bypass. S/p  EGD/colo/ SBE [Dr.Vanga]-  stable.   # B12 def [PCP]- home b12 injections-   # CAD/A.fib-on Xarelto/amiodarone.  Monitor closely- ?  Hemorrhoidal bleeding.   # DISPOSITION: # venofer today # follow up in 4  months- MD: labs- cbc;bmp; iron studies; ferritin; vit D 25-OH possible venofer-Dr.B

## 2022-06-30 NOTE — Progress Notes (Signed)
Fatigue/weakness: no Dyspena: occasionally with afib Light headedness: no Blood in stool: yes bright red, has seen GI

## 2022-07-03 ENCOUNTER — Ambulatory Visit: Payer: Medicare Other

## 2022-07-03 ENCOUNTER — Ambulatory Visit: Payer: Medicare Other | Admitting: Cardiology

## 2022-07-04 ENCOUNTER — Ambulatory Visit: Payer: Medicare Other | Attending: Internal Medicine | Admitting: Internal Medicine

## 2022-07-04 ENCOUNTER — Encounter: Payer: Self-pay | Admitting: Internal Medicine

## 2022-07-04 ENCOUNTER — Other Ambulatory Visit
Admission: RE | Admit: 2022-07-04 | Discharge: 2022-07-04 | Disposition: A | Discharge status: Home / Self Care | Payer: Medicare Other | Attending: Family Medicine | Admitting: Family Medicine

## 2022-07-04 ENCOUNTER — Other Ambulatory Visit
Admission: RE | Admit: 2022-07-04 | Discharge: 2022-07-04 | Disposition: A | Discharge status: Home / Self Care | Payer: Medicare Other | Source: Home / Self Care | Attending: Internal Medicine | Admitting: Internal Medicine

## 2022-07-04 VITALS — BP 130/80 | HR 70 | Ht 72.0 in | Wt 338.0 lb

## 2022-07-04 DIAGNOSIS — D510 Vitamin B12 deficiency anemia due to intrinsic factor deficiency: Secondary | ICD-10-CM | POA: Insufficient documentation

## 2022-07-04 DIAGNOSIS — N401 Enlarged prostate with lower urinary tract symptoms: Secondary | ICD-10-CM | POA: Insufficient documentation

## 2022-07-04 DIAGNOSIS — Z01812 Encounter for preprocedural laboratory examination: Secondary | ICD-10-CM | POA: Insufficient documentation

## 2022-07-04 DIAGNOSIS — R7309 Other abnormal glucose: Secondary | ICD-10-CM | POA: Diagnosis not present

## 2022-07-04 DIAGNOSIS — I10 Essential (primary) hypertension: Secondary | ICD-10-CM | POA: Diagnosis not present

## 2022-07-04 DIAGNOSIS — I4819 Other persistent atrial fibrillation: Secondary | ICD-10-CM

## 2022-07-04 DIAGNOSIS — E782 Mixed hyperlipidemia: Secondary | ICD-10-CM | POA: Insufficient documentation

## 2022-07-04 LAB — LIPID PANEL
Cholesterol: 209 mg/dL — ABNORMAL HIGH (ref 0–200)
HDL: 56 mg/dL (ref >40)
LDL Cholesterol: 125 mg/dL — ABNORMAL HIGH (ref 0–99)
Total CHOL/HDL Ratio: 3.7 RATIO
Triglycerides: 139 mg/dL (ref <150)
VLDL: 28 mg/dL (ref 0–40)

## 2022-07-04 LAB — CBC
HCT: 43.1 % (ref 39.0–52.0)
Hemoglobin: 13.4 g/dL (ref 13.0–17.0)
MCH: 23.8 pg — ABNORMAL LOW (ref 26.0–34.0)
MCHC: 31.1 g/dL (ref 30.0–36.0)
MCV: 76.7 fL — ABNORMAL LOW (ref 80.0–100.0)
Platelets: 203 10*3/uL (ref 150–400)
RBC: 5.62 MIL/uL (ref 4.22–5.81)
RDW: 22.6 % — ABNORMAL HIGH (ref 11.5–15.5)
WBC: 7.6 10*3/uL (ref 4.0–10.5)
nRBC: 0 % (ref 0.0–0.2)

## 2022-07-04 LAB — PSA: Prostatic Specific Antigen: <0.01 ng/mL (ref 0.00–4.00)

## 2022-07-04 LAB — BASIC METABOLIC PANEL
Anion gap: 6 (ref 5–15)
BUN: 17 mg/dL (ref 8–23)
CO2: 27 mmol/L (ref 22–32)
Calcium: 8.5 mg/dL — ABNORMAL LOW (ref 8.9–10.3)
Chloride: 107 mmol/L (ref 98–111)
Creatinine, Ser: 1.07 mg/dL (ref 0.61–1.24)
GFR, Estimated: >60 mL/min (ref >60)
Glucose, Bld: 95 mg/dL (ref 70–99)
Potassium: 4.3 mmol/L (ref 3.5–5.1)
Sodium: 140 mmol/L (ref 135–145)

## 2022-07-04 LAB — HEMOGLOBIN A1C
Hgb A1c MFr Bld: 5.7 % — ABNORMAL HIGH (ref 4.8–5.6)
Mean Plasma Glucose: 116.89 mg/dL

## 2022-07-04 LAB — TSH: TSH: 1.378 u[IU]/mL (ref 0.350–4.500)

## 2022-07-04 NOTE — H&P (View-Only) (Signed)
      Patient Care Team: Karamalegos, Alexander J, DO as PCP - General (Family Medicine) Gollan, Timothy J, MD as PCP - Cardiology (Cardiology) Jayanna Kroeger C, MD as PCP - Electrophysiology (Cardiology)   HPI  [name] is a 66 y.o. male seen in follow-up for atrial fibrillation of longstanding on amiodarone for many years with cardioversion infrequently.  Most recently 2024 (TG) which failed and prompted him to discontinue the amiodarone which I subsequently then resumed  Interval history of GI bleeding; presumed iron deficiency attributed to malabsorption in the context of gastric bypass as well as hemorrhoidal bleeding.  Treated with iron supplementation  Shortness of breath but less so following repletion of iron  Remote negative sleep study DATE TEST EF    12/15 Echo   45-50 %    10/16 CTA   CAScore 224  6/21 Echo   55 % LAE moderate 4.7 cm   5/24 Echo  55-60%  43 ml/m2     Date Cr K Hgb  4/21     13.1  3/23 1.13 4.4 10.7<<10.0   3/24 1.07 (4/24) 4.4(4/24) 11.4 (mcv 71)  6/24   13.3 (mcv 76)    Antiarrhythmics Date Reason stopped  Amiodarone 2016 Failure 3/24 DC by TG, resumed by SK          Records and Results Reviewed   Past Medical History:  Diagnosis Date   Anxiety    Aortic atherosclerosis (HCC)    Cardiomyopathy (in setting of Afib)    a.) TTE 12/26/2013: EF 45-50%, mild ant and antsept HK. mild MR. Mod dil LA. nl RV fxn. Rhythm was Afib; b.) TTE 07/04/2019: EF 55%, mid-apical anteroseptal HK, mild MAC, mild Ao sclerosis, G2DD, RVSP 45.3   Chronic pain syndrome    a.) followed by pain management   Chronic, continuous use of opioids    a.) hydrocodone/APAP 7.5/325 mg; followed by pain management   Coronary artery disease 11/06/2014   a.) cCTA 11/06/2014: Ca score 224 (all in pLAD) -- 74th percentile for age/sex matched control   Diverticulosis    DJD (degenerative joint disease) of knee    History of hiatal hernia    History of kidney stones 2012    Hyperlipidemia    Hyperplastic colon polyp    Hypertension    Inguinal hernia    left   Internal hemorrhoids    Intervertebral disc disorder with radiculopathy of lumbosacral region    Long term current use FULL DOSE (325 mg) aspirin    Long term current use of amiodarone    Long term current use of antithrombotics/antiplatelets    a.) rivaroxaban   Lower extremity edema    Mixed hyperlipidemia    Myalgia due to statin    Osteoarthritis    PAF (paroxysmal atrial fibrillation) (HCC)    a.) CHA2DS2VASc = 4 (age, HTN, CHF, vascular disease history);  b.) s/p DCCV 03/13/2014 (200 J x1); c.) s/p DCCV 05/15/2019 (150 J x 1, 200 J x2); d.) rate/rhythm maintained on oral amiodarone + metoprolol succinate; chronically anticoagulated with rivaroxaban   Pernicious anemia    Prostate cancer (HCC)    Pulmonary nodule, right    a. 10/2014 Cardiac CTA: 7mm RLL nodule; b. 04/2015 CT Chest: stable 7mm RLL nodule. No new nodules; 02/2017 CTA Chest: stable, benign, 7mm RLL pulm nodule.   Sleep difficulties    a.) takes melatonin + trazodone PRN   SVT (supraventricular tachycardia)    Tubular adenoma of   colon     Past Surgical History:  Procedure Laterality Date   BICEPT TENODESIS Right 09/27/2021   Procedure: Right reverse shoulder arthroplasty, biceps tenodesis;  Surgeon: Patel, Sunny, MD;  Location: [facility];  Service: Orthopedics;  Laterality: Right;   CARDIOVERSION N/A 05/15/2019   Procedure: CARDIOVERSION;  Surgeon: Gollan, Timothy J, MD;  Location: [facility];  Service: Cardiovascular;  Laterality: N/A;   CARDIOVERSION N/A 03/13/2014   Procedure: CARDIOVERSION; Location: [facility]; Surgeon: Timothy Gollan, MD   CARDIOVERSION N/A 03/24/2022   Procedure: CARDIOVERSION;  Surgeon: Gollan, Timothy J, MD;  Location: [facility];  Service: Cardiovascular;  Laterality: N/A;   CARDIOVERSION N/A 05/19/2022   Procedure: CARDIOVERSION;  Surgeon: Gollan, Timothy J, MD;  Location: [facility];  Service: Cardiovascular;   Laterality: N/A;   CARPAL TUNNEL RELEASE Right 12/23/2014   Procedure: CARPAL TUNNEL RELEASE;  Surgeon: Howard Miller, MD;  Location: [facility];  Service: Orthopedics;  Laterality: Right;   CARPAL TUNNEL RELEASE Left 01/06/2015   Procedure: CARPAL TUNNEL RELEASE;  Surgeon: Howard Miller, MD;  Location: [facility];  Service: Orthopedics;  Laterality: Left;   COLONOSCOPY WITH PROPOFOL N/A 10/08/2017   Procedure: COLONOSCOPY WITH PROPOFOL;  Surgeon: Vanga, Rohini Reddy, MD;  Location: [facility];  Service: Gastroenterology;  Laterality: N/A;   COLONOSCOPY WITH PROPOFOL N/A 06/07/2022   Procedure: COLONOSCOPY WITH PROPOFOL;  Surgeon: Vanga, Rohini Reddy, MD;  Location: [facility];  Service: Gastroenterology;  Laterality: N/A;   ENTEROSCOPY  06/07/2022   Procedure: ENTEROSCOPY;  Surgeon: Vanga, Rohini Reddy, MD;  Location: [facility];  Service: Gastroenterology;;   ESOPHAGOGASTRODUODENOSCOPY (EGD) WITH PROPOFOL N/A 06/07/2022   Procedure: ESOPHAGOGASTRODUODENOSCOPY (EGD) WITH PROPOFOL;  Surgeon: Vanga, Rohini Reddy, MD;  Location: [facility];  Service: Gastroenterology;  Laterality: N/A;   GASTRIC BYPASS  01/17/2000   KNEE SURGERY Right    knee trauma x3   prostate seeding     REVERSE SHOULDER ARTHROPLASTY Right 09/27/2021   Procedure: Right reverse shoulder arthroplasty, biceps tenodesis;  Surgeon: Patel, Sunny, MD;  Location: [facility];  Service: Orthopedics;  Laterality: Right;   SHOULDER ARTHROSCOPY WITH ROTATOR CUFF REPAIR AND OPEN BICEPS TENODESIS Left 12/27/2021   Procedure: Left shoulder arthroscopic cuff repair (supraspinatus and subscapularis) with Regeneten Patch application;  Surgeon: Patel, Sunny, MD;  Location: [facility];  Service: Orthopedics;  Laterality: Left;    Current Meds  Medication Sig   amiodarone (PACERONE) 200 MG tablet Take 200 mg by mouth 2 (two) times daily.   benazepril (LOTENSIN) 40 MG tablet TAKE ONE TABLET BY MOUTH ONE TIME DAILY   busPIRone (BUSPAR) 10  MG tablet TAKE ONE TABLET BY MOUTH FOUR TIMES A DAY AS NEEDED FOR ANXIETY   Calcium-Magnesium-Vitamin D (OPURITY CALCIUM CITRATE PLUS) 300-20-200 MG-MG-UNIT CHEW Chew 2 tablets by mouth daily. Silver leaf brand 300 Mg supplement   carboxymethylcellulose (REFRESH PLUS) 0.5 % SOLN 1 drop 3 (three) times daily as needed.   CHIA SEED PO Take 2 capsules by mouth daily. 750 mg supplement  2 by mouth in the morning.   cyanocobalamin (VITAMIN B12) 1000 MCG/ML injection Inject 1 mL (1,000 mcg total) into the skin every 30 (thirty) days. INJECT 1ML EVERY 30 DAYS AS DIRECTED   cyclobenzaprine (FLEXERIL) 10 MG tablet TAKE ONE TABLET BY MOUTH THREE TIMES A DAY AS NEEDED FOR MUSCLE SPASM   DULoxetine (CYMBALTA) 30 MG capsule TAKE ONE CAPSULE BY MOUTH ONE TIME DAILY   ferrous sulfate 325 (65 FE) MG EC tablet Take 325 mg by mouth 3 (three) times   daily with meals.   fluticasone (FLONASE) 50 MCG/ACT nasal spray USE TWO SPRAYS IN THE AFFECTED NOSTRIL ONE TIME DAILY   HYDROcodone-acetaminophen (NORCO) 7.5-325 MG tablet Take 1 tablet by mouth every 6 (six) hours as needed for severe pain. Must last 30 days   melatonin 3 MG TABS tablet Take 3 mg by mouth at bedtime.   metoprolol succinate (TOPROL-XL) 50 MG 24 hr tablet TAKE ONE TABLET BY MOUTH ONE TIME DAILY WITH OR IMMEDIATELY FOLLOWING A MEAL   Multiple Vitamin (MULTIVITAMIN WITH MINERALS) TABS tablet Take 1 tablet by mouth daily.   rivaroxaban (XARELTO) 20 MG TABS tablet TAKE ONE TABLET BY MOUTH ONE TIME DAILY WITH SUPPER   traZODone (DESYREL) 150 MG tablet TAKE ONE TABLET BY MOUTH AT BEDTIME   Vitamin D, Ergocalciferol, (DRISDOL) 1.25 MG (50000 UNIT) CAPS capsule Take 50,000 Units by mouth once a week.    Allergies  Allergen Reactions   Bupivacaine Liposome Palpitations      Review of Systems negative except from HPI and PMH  Physical Exam BP 130/80 (BP Location: Left Arm, Patient Position: Sitting, Cuff Size: Large)   Pulse 70   Ht 6' (1.829 m)   Wt  (!) 338 lb (153.3 kg)   SpO2 98%   BMI 45.84 kg/m  Well developed and Morbidly obese in no acute distress HENT normal E scleral and icterus clear Neck Supple JVP flat; carotids brisk and full Clear to ausculation IRRRegular rate and rhythm, no murmurs gallops or rub Soft with active bowel sounds No clubbing cyanosis Trace Edema Alert and oriented, grossly normal motor and sensory function Skin Warm and Dry  ECG atrial fibrillation at 70 Intervals-/10/39 Low voltage  CrCl cannot be calculated (Patient's most recent lab result is older than the maximum 21 days allowed.).   Assessment and  Plan Atrial Fibrillation  persistent    Anemia secondary to GI blood loss question cause possible hemorrhoids   Morbid obesity   Amiodarone therapy x 8 years   HFpEF class III   Anticipate DC cardioversion.  Will plan for 2 defibrillators if necessary.  Hopefully will be able to restore sinus rhythm.  See discussion from previous note is told her that is if not.  Anemia has been addressed and is improving.  GI losses.  On iron replacement.  Heart failure symptoms are better with anemia and hopefully will be approved further with restoration of sinus rhythm.  Should be considered for an SGLT2 (about a 5% absolute reduction over 2-3 years of hospitalization for CHF)   Current medicines are reviewed at length with the patient today .  The patient does not  have concerns regarding medicines.  

## 2022-07-04 NOTE — Progress Notes (Signed)
Patient Care Team: Smitty Cords, DO as PCP - General (Family Medicine) Antonieta Iba, MD as PCP - Cardiology (Cardiology) Duke Salvia, MD as PCP - Electrophysiology (Cardiology)   HPI  [name] is a 67 y.o. male seen in follow-up for atrial fibrillation of longstanding on amiodarone for many years with cardioversion infrequently.  Most recently 2024 (TG) which failed and prompted him to discontinue the amiodarone which I subsequently then resumed  Interval history of GI bleeding; presumed iron deficiency attributed to malabsorption in the context of gastric bypass as well as hemorrhoidal bleeding.  Treated with iron supplementation  Shortness of breath but less so following repletion of iron  Remote negative sleep study DATE TEST EF    12/15 Echo   45-50 %    10/16 CTA   CAScore 224  6/21 Echo   55 % LAE moderate 4.7 cm   5/24 Echo  55-60%  43 ml/m2     Date Cr K Hgb  4/21     13.1  3/23 1.13 4.4 10.7<<10.0   3/24 1.07 (4/24) 4.4(4/24) 11.4 (mcv 71)  6/24   13.3 (mcv 76)    Antiarrhythmics Date Reason stopped  Amiodarone 2016 Failure 3/24 DC by TG, resumed by SK          Records and Results Reviewed   Past Medical History:  Diagnosis Date   Anxiety    Aortic atherosclerosis (HCC)    Cardiomyopathy (in setting of Afib)    a.) TTE 12/26/2013: EF 45-50%, mild ant and antsept HK. mild MR. Mod dil LA. nl RV fxn. Rhythm was Afib; b.) TTE 07/04/2019: EF 55%, mid-apical anteroseptal HK, mild MAC, mild Ao sclerosis, G2DD, RVSP 45.3   Chronic pain syndrome    a.) followed by pain management   Chronic, continuous use of opioids    a.) hydrocodone/APAP 7.5/325 mg; followed by pain management   Coronary artery disease 11/06/2014   a.) cCTA 11/06/2014: Ca score 224 (all in pLAD) -- 74th percentile for age/sex matched control   Diverticulosis    DJD (degenerative joint disease) of knee    History of hiatal hernia    History of kidney stones 2012    Hyperlipidemia    Hyperplastic colon polyp    Hypertension    Inguinal hernia    left   Internal hemorrhoids    Intervertebral disc disorder with radiculopathy of lumbosacral region    Long term current use FULL DOSE (325 mg) aspirin    Long term current use of amiodarone    Long term current use of antithrombotics/antiplatelets    a.) rivaroxaban   Lower extremity edema    Mixed hyperlipidemia    Myalgia due to statin    Osteoarthritis    PAF (paroxysmal atrial fibrillation) (HCC)    a.) CHA2DS2VASc = 4 (age, HTN, CHF, vascular disease history);  b.) s/p DCCV 03/13/2014 (200 J x1); c.) s/p DCCV 05/15/2019 (150 J x 1, 200 J x2); d.) rate/rhythm maintained on oral amiodarone + metoprolol succinate; chronically anticoagulated with rivaroxaban   Pernicious anemia    Prostate cancer (HCC)    Pulmonary nodule, right    a. 10/2014 Cardiac CTA: 7mm RLL nodule; b. 04/2015 CT Chest: stable 7mm RLL nodule. No new nodules; 02/2017 CTA Chest: stable, benign, 7mm RLL pulm nodule.   Sleep difficulties    a.) takes melatonin + trazodone PRN   SVT (supraventricular tachycardia)    Tubular adenoma of  colon     Past Surgical History:  Procedure Laterality Date   BICEPT TENODESIS Right 09/27/2021   Procedure: Right reverse shoulder arthroplasty, biceps tenodesis;  Surgeon: Signa Kell, MD;  Location: [facility];  Service: Orthopedics;  Laterality: Right;   CARDIOVERSION N/A 05/15/2019   Procedure: CARDIOVERSION;  Surgeon: Antonieta Iba, MD;  Location: [facility];  Service: Cardiovascular;  Laterality: N/A;   CARDIOVERSION N/A 03/13/2014   Procedure: CARDIOVERSION; Location: [facility]; Surgeon: [name], MD   CARDIOVERSION N/A 03/24/2022   Procedure: CARDIOVERSION;  Surgeon: Antonieta Iba, MD;  Location: [facility];  Service: Cardiovascular;  Laterality: N/A;   CARDIOVERSION N/A 05/19/2022   Procedure: CARDIOVERSION;  Surgeon: Antonieta Iba, MD;  Location: [facility];  Service: Cardiovascular;   Laterality: N/A;   CARPAL TUNNEL RELEASE Right 12/23/2014   Procedure: CARPAL TUNNEL RELEASE;  Surgeon: Deeann Saint, MD;  Location: [facility];  Service: Orthopedics;  Laterality: Right;   CARPAL TUNNEL RELEASE Left 01/06/2015   Procedure: CARPAL TUNNEL RELEASE;  Surgeon: Deeann Saint, MD;  Location: [facility];  Service: Orthopedics;  Laterality: Left;   COLONOSCOPY WITH PROPOFOL N/A 10/08/2017   Procedure: COLONOSCOPY WITH PROPOFOL;  Surgeon: Toney Reil, MD;  Location: [facility];  Service: Gastroenterology;  Laterality: N/A;   COLONOSCOPY WITH PROPOFOL N/A 06/07/2022   Procedure: COLONOSCOPY WITH PROPOFOL;  Surgeon: Toney Reil, MD;  Location: [facility];  Service: Gastroenterology;  Laterality: N/A;   ENTEROSCOPY  06/07/2022   Procedure: ENTEROSCOPY;  Surgeon: Toney Reil, MD;  Location: [facility] END[facility]COPY;  Service: Gastroenterology;;   ESOPHAGOGASTRODUODENOSCOPY (EGD) WITH PROPOFOL N/A 06/07/2022   Procedure: ESOPHAGOGASTRODUODENOSCOPY (EGD) WITH PROPOFOL;  Surgeon: Toney Reil, MD;  Location: [facility];  Service: Gastroenterology;  Laterality: N/A;   GASTRIC BYPASS  01/17/2000   KNEE SURGERY Right    knee trauma x3   prostate seeding     REVERSE SHOULDER ARTHROPLASTY Right 09/27/2021   Procedure: Right reverse shoulder arthroplasty, biceps tenodesis;  Surgeon: Signa Kell, MD;  Location: [facility];  Service: Orthopedics;  Laterality: Right;   SHOULDER ARTHROSCOPY WITH ROTATOR CUFF REPAIR AND OPEN BICEPS TENODESIS Left 12/27/2021   Procedure: Left shoulder arthroscopic cuff repair (supraspinatus and subscapularis) with Regeneten Patch application;  Surgeon: Signa Kell, MD;  Location: [facility];  Service: Orthopedics;  Laterality: Left;    Current Meds  Medication Sig   amiodarone (PACERONE) 200 MG tablet Take 200 mg by mouth 2 (two) times daily.   benazepril (LOTENSIN) 40 MG tablet TAKE ONE TABLET BY MOUTH ONE TIME DAILY   busPIRone (BUSPAR) 10  MG tablet TAKE ONE TABLET BY MOUTH FOUR TIMES A DAY AS NEEDED FOR ANXIETY   Calcium-Magnesium-Vitamin D (OPURITY CALCIUM CITRATE PLUS) 300-20-200 MG-MG-UNIT CHEW Chew 2 tablets by mouth daily. Silver leaf brand 300 Mg supplement   carboxymethylcellulose (REFRESH PLUS) 0.5 % SOLN 1 drop 3 (three) times daily as needed.   CHIA SEED PO Take 2 capsules by mouth daily. 750 mg supplement  2 by mouth in the morning.   cyanocobalamin (VITAMIN B12) 1000 MCG/ML injection Inject 1 mL (1,000 mcg total) into the skin every 30 (thirty) days. INJECT EVERY 30 DAYS AS DIRECTED   cyclobenzaprine (FLEXERIL) 10 MG tablet TAKE ONE TABLET BY MOUTH THREE TIMES A DAY AS NEEDED FOR MUSCLE SPASM   DULoxetine (CYMBALTA) 30 MG capsule TAKE ONE CAPSULE BY MOUTH ONE TIME DAILY   ferrous sulfate 325 (65 FE) MG EC tablet Take 325 mg by mouth 3 (three) times  daily with meals.   fluticasone (FLONASE) 50 MCG/ACT nasal spray USE TWO SPRAYS IN THE AFFECTED NOSTRIL ONE TIME DAILY   HYDROcodone-acetaminophen (NORCO) 7.5-325 MG tablet Take 1 tablet by mouth every 6 (six) hours as needed for severe pain. Must last 30 days   melatonin 3 MG TABS tablet Take 3 mg by mouth at bedtime.   metoprolol succinate (TOPROL-XL) 50 MG 24 hr tablet TAKE ONE TABLET BY MOUTH ONE TIME DAILY WITH OR IMMEDIATELY FOLLOWING A MEAL   Multiple Vitamin (MULTIVITAMIN WITH MINERALS) TABS tablet Take 1 tablet by mouth daily.   rivaroxaban (XARELTO) 20 MG TABS tablet TAKE ONE TABLET BY MOUTH ONE TIME DAILY WITH SUPPER   traZODone (DESYREL) 150 MG tablet TAKE ONE TABLET BY MOUTH AT BEDTIME   Vitamin D, Ergocalciferol, (DRISDOL) 1.25 MG (50000 UNIT) CAPS capsule Take 50,000 Units by mouth once a week.    Allergies  Allergen Reactions   Bupivacaine Liposome Palpitations      Review of Systems negative except from HPI and PMH  Physical Exam BP 130/80 (BP Location: Left Arm, Patient Position: Sitting, Cuff Size: Large)   Pulse 70   Ht 6' (1.829 m)   Wt  (!) 338 lb (153.3 kg)   SpO2 98%   BMI 45.84 kg/m  Well developed and Morbidly obese in no acute distress HENT normal E scleral and icterus clear Neck Supple JVP flat; carotids brisk and full Clear to ausculation IRRRegular rate and rhythm, no murmurs gallops or rub Soft with active bowel sounds No clubbing cyanosis Trace Edema Alert and oriented, grossly normal motor and sensory function Skin Warm and Dry  ECG atrial fibrillation at 70 Intervals-/10/39 Low voltage  CrCl cannot be calculated (Patient's most recent lab result is older than the maximum 21 days allowed.).   Assessment and  Plan Atrial Fibrillation  persistent    Anemia secondary to GI blood loss question cause possible hemorrhoids   Morbid obesity   Amiodarone therapy x 8 years   HFpEF class III   Anticipate DC cardioversion.  Will plan for 2 defibrillators if necessary.  Hopefully will be able to restore sinus rhythm.  See discussion from previous note is told her that is if not.  Anemia has been addressed and is improving.  GI losses.  On iron replacement.  Heart failure symptoms are better with anemia and hopefully will be approved further with restoration of sinus rhythm.  Should be considered for an SGLT2 (about a 5% absolute reduction over 2-3 years of hospitalization for CHF)   Current medicines are reviewed at length with the patient today .  The patient does not  have concerns regarding medicines.

## 2022-07-04 NOTE — Patient Instructions (Signed)
Medication Instructions:  Your physician recommends that you continue on your current medications as directed. Please refer to the Current Medication list given to you today.  *If you need a refill on your cardiac medications before your next appointment, please call your pharmacy*  Follow-Up: At [facility], you and your health needs are our priority.  As part of our continuing mission to provide you with exceptional heart care, we have created designated Provider Care Teams.  These Care Teams include your primary Cardiologist (physician) and Advanced Practice Providers (APPs -  Physician Assistants and Nurse Practitioners) who all work together to provide you with the care you need, when you need it.  Your next appointment:   As scheduled  Provider:   [name], MD

## 2022-07-05 NOTE — Therapy (Signed)
OUTPATIENT PHYSICAL THERAPY SHOULDER TREATMENT  Patient Name: [name] MRN: [id number] DOB:12-16-1955, 67 y.o., male Today's Date: 07/06/2022   PT End of Session - 07/06/22 1443     Visit Number 34    Number of Visits 73    Date for PT Re-Evaluation 09/12/22    Authorization Type [employer] Medicare    Authorization Time Period [employer] medicare 2024  [id number] on [place]    PT Start Time 1408    PT Stop Time 1438    PT Time Calculation (min) 30 min    Activity Tolerance Patient tolerated treatment well    Behavior During Therapy WFL for tasks assessed/performed            Past Medical History:  Diagnosis Date   Anxiety    Aortic atherosclerosis (HCC)    Cardiomyopathy (in setting of Afib)    a.) TTE 12/26/2013: EF 45-50%, mild ant and antsept HK. mild MR. Mod dil LA. nl RV fxn. Rhythm was Afib; b.) TTE 07/04/2019: EF 55%, mid-apical anteroseptal HK, mild MAC, mild Ao sclerosis, G2DD, RVSP 45.3   Chronic pain syndrome    a.) followed by pain management   Chronic, continuous use of opioids    a.) hydrocodone/APAP 7.5/325 mg; followed by pain management   Coronary artery disease 11/06/2014   a.) cCTA 11/06/2014: Ca score 224 (all in pLAD) -- 74th percentile for age/sex matched control   Diverticulosis    DJD (degenerative joint disease) of knee    History of hiatal hernia    History of kidney stones 2012   Hyperlipidemia    Hyperplastic colon polyp    Hypertension    Inguinal hernia    left   Internal hemorrhoids    Intervertebral disc disorder with radiculopathy of lumbosacral region    Long term current use FULL DOSE (325 mg) aspirin    Long term current use of amiodarone    Long term current use of antithrombotics/antiplatelets    a.) rivaroxaban   Lower extremity edema    Mixed hyperlipidemia    Myalgia due to statin    Osteoarthritis    PAF (paroxysmal atrial fibrillation) (HCC)    a.) CHA2DS2VASc = 4 (age, HTN, CHF, vascular disease history);  b.) s/p DCCV 03/13/2014  (200 J x1); c.) s/p DCCV 05/15/2019 (150 J x 1, 200 J x2); d.) rate/rhythm maintained on oral amiodarone + metoprolol succinate; chronically anticoagulated with rivaroxaban   Pernicious anemia    Prostate cancer (HCC)    Pulmonary nodule, right    a. 10/2014 Cardiac CTA: 7mm RLL nodule; b. 04/2015 CT Chest: stable 7mm RLL nodule. No new nodules; 02/2017 CTA Chest: stable, benign, 7mm RLL pulm nodule.   Sleep difficulties    a.) takes melatonin + trazodone PRN   SVT (supraventricular tachycardia)    Tubular adenoma of colon    Past Surgical History:  Procedure Laterality Date   BICEPT TENODESIS Right 09/27/2021   Procedure: Right reverse shoulder arthroplasty, biceps tenodesis;  Surgeon: Signa Kell, MD;  Location: [facility];  Service: Orthopedics;  Laterality: Right;   CARDIOVERSION N/A 05/15/2019   Procedure: CARDIOVERSION;  Surgeon: Antonieta Iba, MD;  Location: [facility];  Service: Cardiovascular;  Laterality: N/A;   CARDIOVERSION N/A 03/13/2014   Procedure: CARDIOVERSION; Location: [facility]; Surgeon: [name], MD   CARDIOVERSION N/A 03/24/2022   Procedure: CARDIOVERSION;  Surgeon: Antonieta Iba, MD;  Location: [facility];  Service: Cardiovascular;  Laterality: N/A;   CARDIOVERSION N/A 05/19/2022   Procedure:  CARDIOVERSION;  Surgeon: Antonieta Iba, MD;  Location: [facility];  Service: Cardiovascular;  Laterality: N/A;   CARPAL TUNNEL RELEASE Right 12/23/2014   Procedure: CARPAL TUNNEL RELEASE;  Surgeon: Deeann Saint, MD;  Location: [facility];  Service: Orthopedics;  Laterality: Right;   CARPAL TUNNEL RELEASE Left 01/06/2015   Procedure: CARPAL TUNNEL RELEASE;  Surgeon: Deeann Saint, MD;  Location: [facility];  Service: Orthopedics;  Laterality: Left;   COLONOSCOPY WITH PROPOFOL N/A 10/08/2017   Procedure: COLONOSCOPY WITH PROPOFOL;  Surgeon: Toney Reil, MD;  Location: [facility];  Service: Gastroenterology;  Laterality: N/A;   COLONOSCOPY WITH PROPOFOL N/A 06/07/2022    Procedure: COLONOSCOPY WITH PROPOFOL;  Surgeon: Toney Reil, MD;  Location: [facility] ENDOSCOPY;  Service: Gastroenterology;  Laterality: N/A;   ENTEROSCOPY  06/07/2022   Procedure: ENTEROSCOPY;  Surgeon: Toney Reil, MD;  Location: [facility] ENDOSCOPY;  Service: Gastroenterology;;   ESOPHAGOGASTRODUODENOSCOPY (EGD) WITH PROPOFOL N/A 06/07/2022   Procedure: ESOPHAGOGASTRODUODENOSCOPY (EGD) WITH PROPOFOL;  Surgeon: Toney Reil, MD;  Location: [facility] ENDOSCOPY;  Service: Gastroenterology;  Laterality: N/A;   GASTRIC BYPASS  01/17/2000   KNEE SURGERY Right    knee trauma x3   prostate seeding     REVERSE SHOULDER ARTHROPLASTY Right 09/27/2021   Procedure: Right reverse shoulder arthroplasty, biceps tenodesis;  Surgeon: Signa Kell, MD;  Location: [facility];  Service: Orthopedics;  Laterality: Right;   SHOULDER ARTHROSCOPY WITH ROTATOR CUFF REPAIR AND OPEN BICEPS TENODESIS Left 12/27/2021   Procedure: Left shoulder arthroscopic cuff repair (supraspinatus and subscapularis) with Regeneten Patch application;  Surgeon: Signa Kell, MD;  Location: [facility];  Service: Orthopedics;  Laterality: Left;   Patient Active Problem List   Diagnosis Date Noted   Symptomatic anemia 04/26/2022   Chronic pain of both knees 03/30/2022   Bilateral primary osteoarthritis of knee 03/30/2022   Shortness of breath 03/24/2022   Vitamin D deficiency 02/24/2022   S/p reverse total shoulder arthroplasty 09/27/2021   Lesion of bone of lumbosacral spine (L5) 05/12/2021   Localized osteoarthritis of shoulder regions, bilateral 03/29/2021   Chronic pain of both shoulders 03/29/2021   Drug-induced myopathy 02/21/2021   Trigger finger of right hand 11/15/2020   Prostate cancer (HCC) 07/26/2020   Insomnia 08/01/2019   Primary osteoarthritis of both wrists 02/17/2019   Trigger middle finger of left hand 02/17/2019   Chronic pain syndrome 02/17/2019   Chronic radicular lumbar pain 10/08/2018   Lumbar  radiculopathy 10/08/2018   Lumbar degenerative disc disease 10/08/2018   Lumbar facet arthropathy 10/08/2018   Lumbar facet joint syndrome 10/08/2018   Intervertebral disc disorder with radiculopathy of lumbosacral region    Osteoarthritis of knee 11/30/2017   Osteoarthritis of wrist 11/30/2017   Pernicious anemia 05/22/2016   Persistent atrial fibrillation (HCC) 12/20/2015   Carpal tunnel syndrome 11/16/2014   DJD (degenerative joint disease) of knee 10/27/2014   Bilateral carpal tunnel syndrome 10/27/2014   Morbid obesity with BMI of 50.0-59.9, adult (HCC) 10/27/2014   Mixed hyperlipidemia 10/27/2014   H/O gastric bypass 03/20/2014   Hyperkalemia 03/20/2014   History of prostate cancer 12/26/2013   Essential hypertension 12/26/2013   Encounter for anticoagulation discussion and counseling 12/26/2013   PCP: Smitty Cords, DO  REFERRING PROVIDER: Signa Kell, MD  REFERRING DIAGNOSIS: Incomplete tear of left rotator cuff, unspecified whether traumatic M75.112   THERAPY DIAG: Chronic left shoulder pain  Chronic right shoulder pain  Muscle weakness (generalized)  RATIONALE FOR EVALUATION AND TREATMENT: Rehabilitation  ONSET DATE: Surgery 12/27/21,  Chronic L shoulder pain for multiple years  FOLLOW UP APPT WITH PROVIDER: Yes    FROM INITIAL EVALUATION SUBJECTIVE:                                                                                                                                                                                         Chief Complaint: S/p L shoulder RTC repair, biceps tenodesis, and shoulder debridement  Pertinent History Pt with history of chronic bilateral shoulder pain. He underwent right reverse TSR and right biceps tenodesis on 09/27/21. He experienced post-operative instability in R shoulder however it improved and he was able to avoid a revision. On 12/27/21 he underwent left arthroscopic rotator cuff repair (subscapularis  (full-thickness), supraspinatus (partial-thickness) side-to-side repair + Regeneten patch), left arthroscopic biceps tenodesis, and left extensive debridement of shoulder (glenohumeral and subacromial spaces). Per surgical note other intraoperative findings include Type 2 SLAP tear, degenerative anterior labral tearing, grade 2 degenerative changes to articular cartilage of humeral head, and significant posterior capsule synovitis. No significant post-operative complications. He arrives without abduction pillow and states that Dr. Allena Katz advised him he doesn't have to wear the abduction pillow. He got behind on his pain medication today and states that today his shoulder has been the most painful he has experienced since surgery. He also noticed swelling in L lateral upper arm this morning. He did take his pain medication at 14:00, before PT evaluation.  He has a history of chronic back pain and underwent a lumbar facet, medial branch radiofrequency ablation on 01/02/22. At the time of the evaluation pt has not yet appreciated improvement in his back pain from this procedure. He has a past medical history of Cardiomyopathy (in setting of Afib), DJD (degenerative joint disease) of knee, History of kidney stones, Intervertebral disc disorder with radiculopathy of lumbosacral region, Kidney stone (2012), Lower extremity edema, PAF (paroxysmal atrial fibrillation) (HCC), Pernicious anemia, Prostate cancer (HCC), Pulmonary nodule, right, and SVT (supraventricular tachycardia) (HCC). He also  has a past surgical history that includes Knee surgery; Gastric bypass (2002); prostate seeding; Carpal tunnel release (Right, 12/23/2014); Carpal tunnel release (Left, 01/06/2015); Colonoscopy with propofol (N/A, 10/08/2017); and Cardioversion (N/A, 05/15/2019).   Pain:  Pain Intensity: Present: 5/10, Best: 3/10, Worst: 10/10 Pain location: Anterior L shoulder Pain Quality: aching and throbbing Radiating: No  Numbness/Tingling:  No Focal Weakness: Yes History of prior shoulder or neck/shoulder injury, pain, surgery, or therapy: Yes, chronic bilateral shoulder pain with R TSR. 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 No AROM or WB through LUE (per protocol), Passive elevation and ER in staged ROM to begin at week 4 (elevation 90 degrees weeks 4-6 with no ER; 0-120 weeks 6-8 with ER 0-30 degrees). No pulleys or cane until after 6 weeks, recommend no driving x 6-8 weeks.   Weight Bearing Restrictions: Yes No WB through LUE for at least 6 weeks  Living Environment Lives with: lives with their spouse Lives in: House/apartment  Patient Goals: Improve L shoulder strength and pain. Pt would like to be able to play with/lift his grandson as well as take care of his property.  OBJECTIVE:   Patient Surveys  FOTO: To be completed QuickDASH: 47.7%  Cognition Patient is oriented to person, place, and time.  Recent memory is intact.  Remote memory is intact.  Attention span and concentration are intact.  Expressive speech is intact.  Patient's fund of knowledge is within normal limits for educational level.    Gross Musculoskeletal Assessment Tremor: None Bulk: Mild swelling noted in L lateral upper arm. Pt denies chills, fevers, redness, or dischrage from inicisions Tone: Normal  Posture Forward head and rounded shoulders  AROM Full L wrist AROM flexion, extension, radial deviation, and ulnar deviation. MCP flexion  and extension WNL. Unable to assess L shoulder and elbow AROM per protocol  PROM Full L elbow PROM flexion and extension however during first attempt at slow passive extension pt reports sudden pain in L shoulder which eventually improve after a couple minutes of rest. Full L elbow PROM pronation/supination.   LE MMT: Full L wrist strength for flexion, extension, radial deviation, and ulnar deviation. L grip strength is intact and strong. Deferred testing of L elbow and shoulder strength per protocol  Sensation Grossly intact to light touch throughout LUE as determined by testing dermatomes C2-T2. Proprioception and hot/cold testing deferred on this date.  Palpation Pt is generally tender to light palpation around anterior, lateral, and posterior L shoulder;    TODAY'S TREATMENT    SUBJECTIVE: Pt reports his R shoulder continues doing well. He saw Dr. Allena Katz who injected his L shoulder which helped considerably with the pain. Dr. Allena Katz was also concerned that he may have re-torn his subscapularis muscle but would like to proceed with conservative management including PT for continued strengthening. He also saw Dr. Cherylann Ratel who performed a trigger finger injection. He is scheduled for a cardioversion with Dr. Graciela Husbands 07/25/22.   PAIN: L shoulder pain   Ther-ex  All exercises performed sitting fully upright today unless otherwise specified: L shoulder AROM flexion with straight elbow from neutral to around 130 degrees with no weight 3 x 10; L shoulder AROM scaption with straight elbow from neutral to around 110 degrees with no weight, 3 x 10; L shoulder overhead press with no weight 3 x 10; L shoulder IR from vertical to stomach with light manual resistance 3 x 10, no pain reported; L shoulder ER from stomach to vertical with light manual resistance 3 x 10, no pain reported;  L elbow manually resisted flexion 3 x 10; L elbow manually resisted extension 3 x 10; Seated chest press with cane  and manual resistance 3 x 10; Seated rows with cane and manual resistance 3 x 10;  PATIENT EDUCATION:  Education details:  Pt educated throughout session about proper posture and technique with exercises. Improved exercise technique, movement at target joints, use of target muscles after min to mod verbal, visual, tactile cues. Person educated: Patient Education method: Explanation, verbal cues Education comprehension: verbalized understanding and returned demonstration   HOME EXERCISE PROGRAM: Access Code: [email or web address] URL: [email or web address] Date: 03/28/2022 Prepared by: Ria Comment  Exercises - Wrist Flexion AROM  - 1 x daily - 7 x weekly - 2 sets - 10 reps - 3s hold - Wrist Extension AROM  - 1 x daily - 7 x weekly - 2 sets - 10 reps - 3s hold - Seated Scapular Retraction  - 1 x daily - 7 x weekly - 2 sets - 10 reps - 3s hold - Standing Elbow Flexion Extension AROM  - 1 x daily - 7 x weekly - 2 sets - 10 reps - 3s hold - Standing Shoulder Flexion to 90 Degrees  - 1 x daily - 7 x weekly - 2 sets - 10 reps - 3s hold - Shoulder Abduction - Thumbs Up  - 1 x daily - 7 x weekly - 2 sets - 10 reps - 3s hold   ASSESSMENT:  CLINICAL IMPRESSION: Per Dr. Eliane Decree note focused on progressive strengthening for L shoulder as R shoulder is now stable. Notable improvement in L shoulder scaption strength today in the absence of pain. Plan to progress LUE per protocol as pt is able to tolerate. Pt encouraged to continue HEP and follow-up as scheduled. He will benefit from skilled PT to address deficits in range of motion and strength in order to improve overall function at home and with leisure activities such as caring for his property.  REHAB POTENTIAL: Good  CLINICAL DECISION MAKING: Evolving/moderate complexity  EVALUATION COMPLEXITY: Moderate   GOALS: Goals reviewed with patient? Yes  SHORT TERM GOALS: Target date: 05/09/2022   Pt will be independent with HEP to improve  strength and decrease right shoulder pain to improve pain-free function at home and work. Baseline:  Goal status: ACHIEVED   LONG TERM GOALS: Target date: 09/12/2022   Pt will increase L shoulder FOTO to at least 51 to demonstrate significant improvement in function at home and with leisure activities related to L shoulder. Baseline: 01/03/22: To be completed; 01/24/22: 4; 02/28/22: 59; 04/11/22: 55; 06/15/22: 51 Goal status: ACHIEVED  2.  Pt will decrease worst L shoulder pain by at least 3 points on the NPRS in order to demonstrate clinically significant reduction in R shoulder pain. Baseline: 01/03/22: worst: 10/10; 02/28/22: 10/10; 04/11/22: 4/10; 06/15/22: 9/10; Goal status: ONGOING  3.  Pt will decrease quick DASH score by at least 8% in order to demonstrate clinically significant reduction in disability related to L shoulder pain        Baseline: 01/03/22: 47.7%; 02/28/22: 65.91%; 04/11/22: 56.8%; 06/15/22: 56.8% Goal status: ONGOING  4. Pt will demonstrate L shoulder flexion, abduction, IR, and ER strength of at least 4/5 in order to demonstrate improvement in strength and function       Baseline: 01/03/22: Unable to test per protocol; 02/28/22: At least 3/5 but unable to fully test due to protocol; 04/11/22: flexion: 4/5, abduction: 4-/5 (IR and ER at least 3/5 but not tested); 06/15/22: flexion: 4/5 (painful), abduction: 4-/5 (painful), IR (seated): 5/5, ER (seated): 4+/5 (painful); Goal status: PARTIALLY MET  PLAN: PT FREQUENCY: 1-2x/week  PT DURATION: 12 weeks  PLANNED INTERVENTIONS: Therapeutic exercises, Therapeutic activity, Neuromuscular re-education, Balance  training, Gait training, Patient/Family education, Joint manipulation, Joint mobilization, Vestibular training, Canalith repositioning, Aquatic Therapy, Dry Needling, Electrical stimulation, Spinal manipulation, Spinal mobilization, Cryotherapy, Moist heat, Traction, Ultrasound, Ionotophoresis 4mg /ml Dexamethasone, and Manual  therapy  PLAN FOR NEXT SESSION: Progress L shoulder ROM and strength per protocol;   Lynnea Maizes PT, DPT, GCS  Physical Therapist- [place]  [facility]

## 2022-07-06 ENCOUNTER — Ambulatory Visit: Payer: Medicare Other

## 2022-07-06 DIAGNOSIS — M6281 Muscle weakness (generalized): Secondary | ICD-10-CM

## 2022-07-06 DIAGNOSIS — G8929 Other chronic pain: Secondary | ICD-10-CM | POA: Diagnosis not present

## 2022-07-06 DIAGNOSIS — M25512 Pain in left shoulder: Secondary | ICD-10-CM | POA: Diagnosis not present

## 2022-07-06 DIAGNOSIS — M25511 Pain in right shoulder: Secondary | ICD-10-CM | POA: Diagnosis not present

## 2022-07-06 NOTE — Addendum Note (Signed)
Addended by: Thayer Headings, Shivonne Schwartzman L on: 07/06/2022 10:38 AM   Modules accepted: Orders

## 2022-07-10 ENCOUNTER — Telehealth: Payer: Self-pay | Admitting: Student in an Organized Health Care Education/Training Program

## 2022-07-10 NOTE — Telephone Encounter (Signed)
Called patient and he does not have a return appointment for follow up from proc. His MM appointment is in late July and he would like to talk to Dr. Cherylann Ratel before then to discuss his right finger pain that persist after injection. Call was sent to [facility] to call patient back with a VV appointment for eval.

## 2022-07-10 NOTE — Telephone Encounter (Signed)
PT called states that the procedure he had on his right finger didn't do well. PT states that he wanted to see about another injection. I dont see any prn orders in.

## 2022-07-11 ENCOUNTER — Encounter: Payer: Self-pay | Admitting: Student in an Organized Health Care Education/Training Program

## 2022-07-11 ENCOUNTER — Ambulatory Visit
Payer: Medicare Other | Attending: Student in an Organized Health Care Education/Training Program | Admitting: Student in an Organized Health Care Education/Training Program

## 2022-07-11 DIAGNOSIS — M65331 Trigger finger, right middle finger: Secondary | ICD-10-CM | POA: Diagnosis not present

## 2022-07-11 DIAGNOSIS — M65332 Trigger finger, left middle finger: Secondary | ICD-10-CM

## 2022-07-11 DIAGNOSIS — G894 Chronic pain syndrome: Secondary | ICD-10-CM

## 2022-07-11 NOTE — Progress Notes (Signed)
Patient: [name]  Service Category: E/M  Provider: Edward Jolly, MD  DOB: 08-06-1955  DOS: 07/11/2022  Location: Office  MRN: [id number]  Setting: Ambulatory outpatient  Referring Provider: Saralyn Pilar *  Type: Established Patient  Specialty: Interventional Pain Management  PCP: Smitty Cords, DO  Location: Remote location  Delivery: TeleHealth     Virtual Encounter - Pain Management PROVIDER NOTE: Information contained herein reflects review and annotations entered in association with encounter. Interpretation of such information and data should be left to medically-trained personnel. Information provided to patient can be located elsewhere in the medical record under "Patient Instructions". Document created using STT-dictation technology, any transcriptional errors that may result from process are unintentional.    Contact & Pharmacy Preferred: [phone] Home: [phone] (home) Mobile: [phone] (mobile) E-mail: [email or web address]  [facility] [address] [facility] - [place], [place] - [address] AT [facility] [address] [place] [address] Phone: [phone] Fax: [phone]   Pre-screening  [name] offered "in-person" vs "virtual" encounter. He indicated preferring virtual for this encounter.   Reason COVID-19*  Social distancing based on CDC and AMA recommendations.   I contacted [name] on 07/11/2022 via telephone.      I clearly identified myself as Edward Jolly, MD. I verified that I was speaking with the correct person using two identifiers (Name: [name], and date of birth: 1955-02-06).  Consent I sought verbal advanced consent from [name] for virtual visit interactions. I informed [name] of possible security and privacy concerns, risks, and limitations associated with providing "not-in-person" medical evaluation and management services. I also informed [name] of the availability of "in-person"  appointments. Finally, I informed him that there would be a charge for the virtual visit and that he could be  personally, fully or partially, financially responsible for it. [name] expressed understanding and agreed to proceed.   Historic Elements   [name] is a 67 y.o. year old, male patient evaluated today after our last contact on 07/10/2022. [name]  has a past medical history of Anxiety, Aortic atherosclerosis (HCC), Cardiomyopathy (in setting of Afib), Chronic pain syndrome, Chronic, continuous use of opioids, Coronary artery disease (11/06/2014), Diverticulosis, DJD (degenerative joint disease) of knee, History of hiatal hernia, History of kidney stones (2012), Hyperlipidemia, Hyperplastic colon polyp, Hypertension, Inguinal hernia, Internal hemorrhoids, Intervertebral disc disorder with radiculopathy of lumbosacral region, Long term current use FULL DOSE (325 mg) aspirin, Long term current use of amiodarone, Long term current use of antithrombotics/antiplatelets, Lower extremity edema, Mixed hyperlipidemia, Myalgia due to statin, Osteoarthritis, PAF (paroxysmal atrial fibrillation) (HCC), Pernicious anemia, Prostate cancer (HCC), Pulmonary nodule, right, Sleep difficulties, SVT (supraventricular tachycardia), and Tubular adenoma of colon. He also  has a past surgical history that includes Knee surgery (Right); Gastric bypass (01/17/2000); prostate seeding; Carpal tunnel release (Right, 12/23/2014); Carpal tunnel release (Left, 01/06/2015); Colonoscopy with propofol (N/A, 10/08/2017); Cardioversion (N/A, 05/15/2019); Cardioversion (N/A, 03/13/2014); Reverse shoulder arthroplasty (Right, 09/27/2021); Bicept tenodesis (Right, 09/27/2021); Shoulder arthroscopy with rotator cuff repair and open biceps tenodesis (Left, 12/27/2021); Cardioversion (N/A, 03/24/2022); Cardioversion (N/A, 05/19/2022); Colonoscopy with propofol (N/A, 06/07/2022); Esophagogastroduodenoscopy (egd) with propofol (N/A,  06/07/2022); and enteroscopy (06/07/2022). [name] has a current medication list which includes the following prescription(s): amiodarone, benazepril, buspirone, opurity calcium citrate plus, carboxymethylcellulose, chia seed, cyanocobalamin, cyclobenzaprine, duloxetine, ferrous sulfate, fluticasone, hydrocodone-acetaminophen, [START ON 07/20/2022] hydrocodone-acetaminophen, melatonin, metoprolol succinate, multivitamin with minerals, xarelto, tamsulosin, trazodone, and vitamin d (ergocalciferol). He  reports that he quit smoking about 43 years ago. His smoking use included cigarettes. He has a 10.00 pack-year smoking history. He has quit using smokeless tobacco.  His smokeless tobacco use included snuff. He reports that he does not currently use alcohol. He reports that he does not use drugs. [name] is allergic to bupivacaine liposome.  BMI: Estimated body mass index is 45.84 kg/m as calculated from the following:   Height as of 07/04/22: 6' (1.829 m).   Weight as of 07/04/22: 338 lb (153.3 kg). Last encounter: 06/28/2022. Last procedure: 05/01/2022.  HPI  Today, he is being contacted for follow-up evaluation  Patient states that his left middle trigger finger is doing better after trigger finger ligament injection and continues to have problems with his right middle trigger finger. We discussed repeating right middle trigger finger injection and adding more volume.  Patient in agreement with plan.  Pharmacotherapy Assessment   Opioid Analgesic:  hydrocodone 7.5 mg 3 times daily as needed     Monitoring: [name] PMP: PDMP reviewed during this encounter.       Pharmacotherapy: No side-effects or adverse reactions reported. Compliance: No problems identified. Effectiveness: Clinically acceptable. Plan: Refer to "POC". UDS:  Summary  Date Value Ref Range Status  08/23/2021 Note  Final    Comment:    ==================================================================== [employer] [facility]  (MW) ==================================================================== Test                             Result       Flag       Units  Drug Present and Declared for Prescription Verification   Hydrocodone                    4372         EXPECTED   ng/mg creat   Hydromorphone                  98           EXPECTED   ng/mg creat   Dihydrocodeine                 478          EXPECTED   ng/mg creat   Norhydrocodone                 >2488        EXPECTED   ng/mg creat    Sources of hydrocodone include scheduled prescription medications.    Hydromorphone, dihydrocodeine and norhydrocodone are expected    metabolites of hydrocodone. Hydromorphone and dihydrocodeine are    also available as scheduled prescription medications.  ==================================================================== Test                      Result    Flag   Units      Ref Range   Creatinine              201              mg/dL      >=16 ==================================================================== Declared Medications:  The flagging and interpretation on this report are based on the  following declared medications.  Unexpected results may arise from  inaccuracies in the declared medications.   **Note: The testing scope of this panel includes these medications:   Hydrocodone (Norco)   **Note: The testing scope of this panel does not include the  following  reported medications:   Acetaminophen (Norco)  Amiodarone (Pacerone)  Amlodipine (Norvasc)  Aspirin  Benazepril (Lotensin)  Buspirone (Buspar)  Cyclobenzaprine (Flexeril)  Duloxetine (Cymbalta)  Eye Drops  Furosemide (Lasix)  Melatonin  Metoprolol (Toprol)  Multivitamin  Rivaroxaban (Xarelto)  Supplement  Tamsulosin (Flomax)  Trazodone (Desyrel)  Vitamin B12 ==================================================================== For clinical consultation, please call [phone]. ====================================================================    No results found for: "CBDTHCR", "D8THCCBX", "D9THCCBX"   Laboratory Chemistry Profile   Renal Lab Results  Component Value Date   BUN 17 07/04/2022   CREATININE 1.07 07/04/2022   BCR NOT APPLICABLE 07/27/2021   GFRAA 72 07/21/2020   GFRNONAA >60 07/04/2022    Hepatic Lab Results  Component Value Date   AST 19 04/26/2022   ALT 13 04/26/2022   ALBUMIN 4.1 04/26/2022   ALKPHOS 68 04/26/2022   LIPASE 28 03/13/2017    Electrolytes Lab Results  Component Value Date   NA 140 07/04/2022   K 4.3 07/04/2022   CL 107 07/04/2022   CALCIUM 8.5 (L) 07/04/2022   MG 2.0 02/01/2021    Bone Lab Results  Component Value Date   25OHVITD1 32 02/01/2021   25OHVITD2 <1.0 02/01/2021   25OHVITD3 32 02/01/2021    Inflammation (CRP: Acute Phase) (ESR: Chronic Phase) Lab Results  Component Value Date   CRP 1.7 02/09/2022   ESRSEDRATE 6 02/09/2022         Note: Above Lab results reviewed.     Assessment  The primary encounter diagnosis was Trigger middle finger of right hand. Diagnoses of Trigger middle finger of left hand and Chronic pain syndrome were also pertinent to this visit.  Plan of Care  Repeat right middle finger trigger finger injection Orders:  Orders Placed This Encounter  Procedures   Injection tendon or ligament    Standing Status:   Future    Standing Expiration Date:   10/11/2022    Scheduling Instructions:     Right middle trigger finger   Follow-up plan:   Return in about 22 days (around 08/02/2022) for Right middle trigger finger, in clinic NS.      s/p lesi #1 (left L4/5) on 8/17, #2 on 10/21/2018 (left L3/4), #3 on 12/09/2018 (Left L3/4); 02/24/2019: Right wrist injection and left trigger finger (middle finger) injection.,  Bilateral wrist injection 10/15/2019, bilateral knee Hyalgan No. 1 01/26/1960. #2 02/23/2020, #3 03/24/2020.  Endorsing approximately 75 to 80% pain relief for  bilateral knee pain.  Lumbar L3-5 RFA, Left: 02/16/21, Right 03/09/21, 01/02/22            Recent Visits Date Type Provider Dept  06/28/22 Office Visit Edward Jolly, MD [facility]  05/01/22 Procedure visit Edward Jolly, MD [facility]  Showing recent visits within past 90 days and meeting all other requirements Today's Visits Date Type Provider Dept  07/11/22 Office Visit Edward Jolly, MD [facility]  Showing today's visits and meeting all other requirements Future Appointments Date Type Provider Dept  08/15/22 Appointment Edward Jolly, MD [facility]  Showing future appointments within next 90 days and meeting all other requirements  I discussed the assessment and treatment plan with the patient. The patient was provided an opportunity to ask questions and all were answered. The patient agreed with the plan and demonstrated an understanding of the instructions.  Patient advised to call back or seek an in-person evaluation if the symptoms or condition worsens.  Duration of encounter: 10 minutes.  Note by: Edward Jolly, MD Date:  07/11/2022; Time: 2:55 PM

## 2022-07-14 ENCOUNTER — Telehealth: Payer: Self-pay

## 2022-07-14 NOTE — Telephone Encounter (Signed)
Attempted phone call to pt.  OK per EPIC to leave detailed voicemail message.  Pt advised of lab result and recommendations per Dr Graciela Husbands.  May call [phone] for any further questions or concerns.

## 2022-07-14 NOTE — Telephone Encounter (Signed)
-----   Message from Duke Salvia, MD sent at 07/13/2022 11:34 AM EDT ----- Please Inform Patient   Labs are normal x chronically small blood cells and borderline low Ca   he should follow up with his PCP   Thanks

## 2022-07-16 ENCOUNTER — Other Ambulatory Visit: Payer: Self-pay | Admitting: Cardiovascular Disease

## 2022-07-19 ENCOUNTER — Other Ambulatory Visit: Payer: Self-pay | Admitting: Orthopedic Surgery

## 2022-07-19 DIAGNOSIS — M25519 Pain in unspecified shoulder: Secondary | ICD-10-CM

## 2022-07-21 NOTE — Therapy (Signed)
OUTPATIENT PHYSICAL THERAPY SHOULDER TREATMENT  Patient Name: [name] MRN: [id number] DOB:10-05-1955, 67 y.o., male Today's Date: 07/24/2022   PT End of Session - 07/24/22 0757     Visit Number 35    Number of Visits 73    Date for PT Re-Evaluation 09/12/22    Authorization Type [employer] Medicare    Authorization Time Period UHC medicare 2024  [id number] on [place]    PT Start Time 0800    PT Stop Time 0828    PT Time Calculation (min) 28 min    Activity Tolerance Patient tolerated treatment well    Behavior During Therapy [facility] for tasks assessed/performed            Past Medical History:  Diagnosis Date   Anxiety    Aortic atherosclerosis (HCC)    Cardiomyopathy (in setting of Afib)    a.) TTE 12/26/2013: EF 45-50%, mild ant and antsept HK. mild MR. Mod dil LA. nl RV fxn. Rhythm was Afib; b.) TTE 07/04/2019: EF 55%, mid-apical anteroseptal HK, mild MAC, mild Ao sclerosis, G2DD, RVSP 45.3   Chronic pain syndrome    a.) followed by pain management   Chronic, continuous use of opioids    a.) hydrocodone/APAP 7.5/325 mg; followed by pain management   Coronary artery disease 11/06/2014   a.) cCTA 11/06/2014: Ca score 224 (all in pLAD) -- 74th percentile for age/sex matched control   Diverticulosis    DJD (degenerative joint disease) of knee    History of hiatal hernia    History of kidney stones 2012   Hyperlipidemia    Hyperplastic colon polyp    Hypertension    Inguinal hernia    left   Internal hemorrhoids    Intervertebral disc disorder with radiculopathy of lumbosacral region    Long term current use FULL DOSE (325 mg) aspirin    Long term current use of amiodarone    Long term current use of antithrombotics/antiplatelets    a.) rivaroxaban   Lower extremity edema    Mixed hyperlipidemia    Myalgia due to statin    Osteoarthritis    PAF (paroxysmal atrial fibrillation) (HCC)    a.) CHA2DS2VASc = 4 (age, HTN, CHF, vascular disease history);  b.) s/p DCCV 03/13/2014 (200  J x1); c.) s/p DCCV 05/15/2019 (150 J x 1, 200 J x2); d.) rate/rhythm maintained on oral amiodarone + metoprolol succinate; chronically anticoagulated with rivaroxaban   Pernicious anemia    Prostate cancer (HCC)    Pulmonary nodule, right    a. 10/2014 Cardiac CTA: 7mm RLL nodule; b. 04/2015 CT Chest: stable 7mm RLL nodule. No new nodules; 02/2017 CTA Chest: stable, benign, 7mm RLL pulm nodule.   Sleep difficulties    a.) takes melatonin + trazodone PRN   SVT (supraventricular tachycardia)    Tubular adenoma of colon    Past Surgical History:  Procedure Laterality Date   BICEPT TENODESIS Right 09/27/2021   Procedure: Right reverse shoulder arthroplasty, biceps tenodesis;  Surgeon: Signa Kell, MD;  Location: [facility] ORS;  Service: Orthopedics;  Laterality: Right;   CARDIOVERSION N/A 05/15/2019   Procedure: CARDIOVERSION;  Surgeon: Antonieta Iba, MD;  Location: [facility];  Service: Cardiovascular;  Laterality: N/A;   CARDIOVERSION N/A 03/13/2014   Procedure: CARDIOVERSION; Location: [facility]; Surgeon: [name], MD   CARDIOVERSION N/A 03/24/2022   Procedure: CARDIOVERSION;  Surgeon: Antonieta Iba, MD;  Location: [facility];  Service: Cardiovascular;  Laterality: N/A;   CARDIOVERSION N/A 05/19/2022   Procedure:  CARDIOVERSION;  Surgeon: Antonieta Iba, MD;  Location: [facility];  Service: Cardiovascular;  Laterality: N/A;   CARPAL TUNNEL RELEASE Right 12/23/2014   Procedure: CARPAL TUNNEL RELEASE;  Surgeon: Deeann Saint, MD;  Location: [facility];  Service: Orthopedics;  Laterality: Right;   CARPAL TUNNEL RELEASE Left 01/06/2015   Procedure: CARPAL TUNNEL RELEASE;  Surgeon: Deeann Saint, MD;  Location: [facility];  Service: Orthopedics;  Laterality: Left;   COLONOSCOPY WITH PROPOFOL N/A 10/08/2017   Procedure: COLONOSCOPY WITH PROPOFOL;  Surgeon: Toney Reil, MD;  Location: [facility] END[facility]COPY;  Service: Gastroenterology;  Laterality: N/A;   COLONOSCOPY WITH PROPOFOL N/A 06/07/2022    Procedure: COLONOSCOPY WITH PROPOFOL;  Surgeon: Toney Reil, MD;  Location: [facility] ENDOSCOPY;  Service: Gastroenterology;  Laterality: N/A;   ENTEROSCOPY  06/07/2022   Procedure: ENTEROSCOPY;  Surgeon: Toney Reil, MD;  Location: [facility] ENDOSCOPY;  Service: Gastroenterology;;   ESOPHAGOGASTRODUODENOSCOPY (EGD) WITH PROPOFOL N/A 06/07/2022   Procedure: ESOPHAGOGASTRODUODENOSCOPY (EGD) WITH PROPOFOL;  Surgeon: Toney Reil, MD;  Location: [facility] ENDOSCOPY;  Service: Gastroenterology;  Laterality: N/A;   GASTRIC BYPASS  01/17/2000   KNEE SURGERY Right    knee trauma x3   prostate seeding     REVERSE SHOULDER ARTHROPLASTY Right 09/27/2021   Procedure: Right reverse shoulder arthroplasty, biceps tenodesis;  Surgeon: Signa Kell, MD;  Location: [facility]S;  Service: Orthopedics;  Laterality: Right;   SHOULDER ARTHROSCOPY WITH ROTATOR CUFF REPAIR AND OPEN BICEPS TENODESIS Left 12/27/2021   Procedure: Left shoulder arthroscopic cuff repair (supraspinatus and subscapularis) with Regeneten Patch application;  Surgeon: Signa Kell, MD;  Location: [facility];  Service: Orthopedics;  Laterality: Left;   Patient Active Problem List   Diagnosis Date Noted   Symptomatic anemia 04/26/2022   Chronic pain of both knees 03/30/2022   Bilateral primary osteoarthritis of knee 03/30/2022   Shortness of breath 03/24/2022   Vitamin D deficiency 02/24/2022   S/p reverse total shoulder arthroplasty 09/27/2021   Lesion of bone of lumbosacral spine (L5) 05/12/2021   Localized osteoarthritis of shoulder regions, bilateral 03/29/2021   Chronic pain of both shoulders 03/29/2021   Drug-induced myopathy 02/21/2021   Trigger finger of right hand 11/15/2020   Prostate cancer (HCC) 07/26/2020   Insomnia 08/01/2019   Primary osteoarthritis of both wrists 02/17/2019   Trigger middle finger of left hand 02/17/2019   Chronic pain syndrome 02/17/2019   Chronic radicular lumbar pain 10/08/2018   Lumbar  radiculopathy 10/08/2018   Lumbar degenerative disc disease 10/08/2018   Lumbar facet arthropathy 10/08/2018   Lumbar facet joint syndrome 10/08/2018   Intervertebral disc disorder with radiculopathy of lumbosacral region    Osteoarthritis of knee 11/30/2017   Osteoarthritis of wrist 11/30/2017   Pernicious anemia 05/22/2016   Persistent atrial fibrillation (HCC) 12/20/2015   Carpal tunnel syndrome 11/16/2014   DJD (degenerative joint disease) of knee 10/27/2014   Bilateral carpal tunnel syndrome 10/27/2014   Morbid obesity with BMI of 50.0-59.9, adult (HCC) 10/27/2014   Mixed hyperlipidemia 10/27/2014   H/O gastric bypass 03/20/2014   Hyperkalemia 03/20/2014   History of prostate cancer 12/26/2013   Essential hypertension 12/26/2013   Encounter for anticoagulation discussion and counseling 12/26/2013   PCP: Smitty Cords, DO  REFERRING PROVIDER: Signa Kell, MD  REFERRING DIAGNOSIS: Incomplete tear of left rotator cuff, unspecified whether traumatic M75.112   THERAPY DIAG: Chronic left shoulder pain  Chronic right shoulder pain  Muscle weakness (generalized)  RATIONALE FOR EVALUATION AND TREATMENT: Rehabilitation  ONSET DATE: Surgery 12/27/21,  Chronic L shoulder pain for multiple years  FOLLOW UP APPT WITH PROVIDER: Yes    FROM INITIAL EVALUATION SUBJECTIVE:                                                                                                                                                                                         Chief Complaint: S/p L shoulder RTC repair, biceps tenodesis, and shoulder debridement  Pertinent History Pt with history of chronic bilateral shoulder pain. He underwent right reverse TSR and right biceps tenodesis on 09/27/21. He experienced post-operative instability in R shoulder however it improved and he was able to avoid a revision. On 12/27/21 he underwent left arthroscopic rotator cuff repair (subscapularis  (full-thickness), supraspinatus (partial-thickness) side-to-side repair + Regeneten patch), left arthroscopic biceps tenodesis, and left extensive debridement of shoulder (glenohumeral and subacromial spaces). Per surgical note other intraoperative findings include Type 2 SLAP tear, degenerative anterior labral tearing, grade 2 degenerative changes to articular cartilage of humeral head, and significant posterior capsule synovitis. No significant post-operative complications. He arrives without abduction pillow and states that Dr. Allena Katz advised him he doesn't have to wear the abduction pillow. He got behind on his pain medication today and states that today his shoulder has been the most painful he has experienced since surgery. He also noticed swelling in L lateral upper arm this morning. He did take his pain medication at 14:00, before PT evaluation.  He has a history of chronic back pain and underwent a lumbar facet, medial branch radiofrequency ablation on 01/02/22. At the time of the evaluation pt has not yet appreciated improvement in his back pain from this procedure. He has a past medical history of Cardiomyopathy (in setting of Afib), DJD (degenerative joint disease) of knee, History of kidney stones, Intervertebral disc disorder with radiculopathy of lumbosacral region, Kidney stone (2012), Lower extremity edema, PAF (paroxysmal atrial fibrillation) (HCC), Pernicious anemia, Prostate cancer (HCC), Pulmonary nodule, right, and SVT (supraventricular tachycardia) (HCC). He also  has a past surgical history that includes Knee surgery; Gastric bypass (2002); prostate seeding; Carpal tunnel release (Right, 12/23/2014); Carpal tunnel release (Left, 01/06/2015); Colonoscopy with propofol (N/A, 10/08/2017); and Cardioversion (N/A, 05/15/2019).   Pain:  Pain Intensity: Present: 5/10, Best: 3/10, Worst: 10/10 Pain location: Anterior L shoulder Pain Quality: aching and throbbing Radiating: No  Numbness/Tingling:  No Focal Weakness: Yes History of prior shoulder or neck/shoulder injury, pain, surgery, or therapy: Yes, chronic bilateral shoulder pain with R TSR. 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 No AROM or WB through LUE (per protocol), Passive elevation and ER in staged ROM to begin at week 4 (elevation 90 degrees weeks 4-6 with no ER; 0-120 weeks 6-8 with ER 0-30 degrees). No pulleys or cane until after 6 weeks, recommend no driving x 6-8 weeks.   Weight Bearing Restrictions: Yes No WB through LUE for at least 6 weeks  Living Environment Lives with: lives with their spouse Lives in: House/apartment  Patient Goals: Improve L shoulder strength and pain. Pt would like to be able to play with/lift his grandson as well as take care of his property.  OBJECTIVE:   Patient Surveys  FOTO: To be completed QuickDASH: 47.7%  Cognition Patient is oriented to person, place, and time.  Recent memory is intact.  Remote memory is intact.  Attention span and concentration are intact.  Expressive speech is intact.  Patient's fund of knowledge is within normal limits for educational level.    Gross Musculoskeletal Assessment Tremor: None Bulk: Mild swelling noted in L lateral upper arm. Pt denies chills, fevers, redness, or dischrage from inicisions Tone: Normal  Posture Forward head and rounded shoulders  AROM Full L wrist AROM flexion, extension, radial deviation, and ulnar deviation. MCP flexion  and extension WNL. Unable to assess L shoulder and elbow AROM per protocol  PROM Full L elbow PROM flexion and extension however during first attempt at slow passive extension pt reports sudden pain in L shoulder which eventually improve after a couple minutes of rest. Full L elbow PROM pronation/supination.   LE MMT: Full L wrist strength for flexion, extension, radial deviation, and ulnar deviation. L grip strength is intact and strong. Deferred testing of L elbow and shoulder strength per protocol  Sensation Grossly intact to light touch throughout LUE as determined by testing dermatomes C2-T2. Proprioception and hot/cold testing deferred on this date.  Palpation Pt is generally tender to light palpation around anterior, lateral, and posterior L shoulder;    TODAY'S TREATMENT    SUBJECTIVE: Pt reports his R shoulder continues doing well. The L shoulder injection did not help with his pain and he is scheduled for a L shoulder MRI today. He has his cardioversion scheduled with Dr. Graciela Husbands tomorrow.   PAIN: L shoulder pain   Ther-ex  All exercises performed sitting fully upright today unless otherwise specified: L shoulder AROM flexion with straight elbow from neutral to around 130 degrees with no weight 3 x 10; L shoulder AROM scaption with straight elbow from neutral to around 110 degrees with no weight, 3 x 10; L shoulder overhead press with no weight 3 x 10; L shoulder IR from vertical to stomach with light manual resistance 3 x 10, no pain reported; L shoulder ER from stomach to vertical with light manual resistance 3 x 10, no pain reported;  L elbow manually resisted flexion 3 x 10; L elbow manually resisted extension 3 x 10;   PATIENT EDUCATION:  Education details:  Pt educated throughout session about proper posture and technique with exercises. Improved exercise technique, movement at target joints, use of target muscles after min to mod verbal, visual, tactile  cues. Person educated: Patient Education method: Explanation, verbal cues Education comprehension: verbalized understanding and returned demonstration  HOME EXERCISE PROGRAM: Access Code: [email or web address] URL: [email or web address] Date: 03/28/2022 Prepared by: Ria Comment  Exercises - Wrist Flexion AROM  - 1 x daily - 7 x weekly - 2 sets - 10 reps - 3s hold - Wrist Extension AROM  - 1 x daily - 7 x weekly - 2 sets - 10 reps - 3s hold - Seated Scapular Retraction  - 1 x daily - 7 x weekly - 2 sets - 10 reps - 3s hold - Standing Elbow Flexion Extension AROM  - 1 x daily - 7 x weekly - 2 sets - 10 reps - 3s hold - Standing Shoulder Flexion to 90 Degrees  - 1 x daily - 7 x weekly - 2 sets - 10 reps - 3s hold - Shoulder Abduction - Thumbs Up  - 1 x daily - 7 x weekly - 2 sets - 10 reps - 3s hold   ASSESSMENT:  CLINICAL IMPRESSION: Pt has a L shoulder MRI scheduled for today and will adjust plan of care as needed pending the results. Otherwise plan to progress LUE per protocol as pt is able to tolerate. No excessive increase in pain during session today. Maintained minimal resistance in order to prevent inflammation or edema in L shoulder given his MRI scheduled for today. Pt encouraged to continue HEP and follow-up as scheduled. He will benefit from skilled PT to address deficits in range of motion and strength in order to improve overall function at home and with leisure activities such as caring for his property.  REHAB POTENTIAL: Good  CLINICAL DECISION MAKING: Evolving/moderate complexity  EVALUATION COMPLEXITY: Moderate   GOALS: Goals reviewed with patient? Yes  SHORT TERM GOALS: Target date: 05/09/2022   Pt will be independent with HEP to improve strength and decrease right shoulder pain to improve pain-free function at home and work. Baseline:  Goal status: ACHIEVED   LONG TERM GOALS: Target date: 09/12/2022   Pt will increase L shoulder FOTO to at least 51 to  demonstrate significant improvement in function at home and with leisure activities related to L shoulder. Baseline: 01/03/22: To be completed; 01/24/22: 4; 02/28/22: 59; 04/11/22: 55; 06/15/22: 51 Goal status: ACHIEVED  2.  Pt will decrease worst L shoulder pain by at least 3 points on the NPRS in order to demonstrate clinically significant reduction in R shoulder pain. Baseline: 01/03/22: worst: 10/10; 02/28/22: 10/10; 04/11/22: 4/10; 06/15/22: 9/10; Goal status: ONGOING  3.  Pt will decrease quick DASH score by at least 8% in order to demonstrate clinically significant reduction in disability related to L shoulder pain        Baseline: 01/03/22: 47.7%; 02/28/22: 65.91%; 04/11/22: 56.8%; 06/15/22: 56.8% Goal status: ONGOING  4. Pt will demonstrate L shoulder flexion, abduction, IR, and ER strength of at least 4/5 in order to demonstrate improvement in strength and function       Baseline: 01/03/22: Unable to test per protocol; 02/28/22: At least 3/5 but unable to fully test due to protocol; 04/11/22: flexion: 4/5, abduction: 4-/5 (IR and ER at least 3/5 but not tested); 06/15/22: flexion: 4/5 (painful), abduction: 4-/5 (painful), IR (seated): 5/5, ER (seated): 4+/5 (painful); Goal status: PARTIALLY MET  PLAN: PT FREQUENCY: 1-2x/week  PT DURATION: 12 weeks  PLANNED INTERVENTIONS: Therapeutic exercises, Therapeutic activity, Neuromuscular re-education, Balance training, Gait training, Patient/Family education, Joint manipulation, Joint mobilization, Vestibular training, Canalith repositioning, Aquatic Therapy, Dry Needling, Electrical stimulation, Spinal manipulation, Spinal mobilization, Cryotherapy, Moist heat, Traction, Ultrasound, Ionotophoresis 4mg /ml Dexamethasone, and Manual therapy  PLAN FOR NEXT SESSION: Progress L shoulder ROM and strength per protocol;   Lynnea Maizes PT, DPT, GCS  Physical Therapist- [place]  [facility]

## 2022-07-24 ENCOUNTER — Ambulatory Visit
Admission: RE | Admit: 2022-07-24 | Discharge: 2022-07-24 | Disposition: A | Discharge status: Home / Self Care | Payer: Medicare Other | Source: Ambulatory Visit | Attending: Orthopedic Surgery | Admitting: Orthopedic Surgery

## 2022-07-24 ENCOUNTER — Other Ambulatory Visit: Payer: Self-pay | Admitting: Orthopedic Surgery

## 2022-07-24 ENCOUNTER — Ambulatory Visit: Payer: Medicare Other | Attending: Orthopedic Surgery

## 2022-07-24 DIAGNOSIS — M75102 Unspecified rotator cuff tear or rupture of left shoulder, not specified as traumatic: Secondary | ICD-10-CM | POA: Diagnosis not present

## 2022-07-24 DIAGNOSIS — Z9889 Other specified postprocedural states: Secondary | ICD-10-CM | POA: Insufficient documentation

## 2022-07-24 DIAGNOSIS — G8929 Other chronic pain: Secondary | ICD-10-CM | POA: Diagnosis not present

## 2022-07-24 DIAGNOSIS — M2550 Pain in unspecified joint: Secondary | ICD-10-CM | POA: Diagnosis not present

## 2022-07-24 DIAGNOSIS — M6281 Muscle weakness (generalized): Secondary | ICD-10-CM | POA: Diagnosis not present

## 2022-07-24 DIAGNOSIS — S46812A Strain of other muscles, fascia and tendons at shoulder and upper arm level, left arm, initial encounter: Secondary | ICD-10-CM | POA: Diagnosis not present

## 2022-07-24 DIAGNOSIS — M25519 Pain in unspecified shoulder: Secondary | ICD-10-CM

## 2022-07-24 DIAGNOSIS — M25512 Pain in left shoulder: Secondary | ICD-10-CM | POA: Diagnosis not present

## 2022-07-24 DIAGNOSIS — Z96611 Presence of right artificial shoulder joint: Secondary | ICD-10-CM

## 2022-07-24 DIAGNOSIS — M75122 Complete rotator cuff tear or rupture of left shoulder, not specified as traumatic: Secondary | ICD-10-CM | POA: Diagnosis not present

## 2022-07-24 DIAGNOSIS — M2559 Pain in other specified joint: Secondary | ICD-10-CM | POA: Diagnosis not present

## 2022-07-24 DIAGNOSIS — M159 Polyosteoarthritis, unspecified: Secondary | ICD-10-CM | POA: Diagnosis not present

## 2022-07-24 DIAGNOSIS — M25412 Effusion, left shoulder: Secondary | ICD-10-CM | POA: Diagnosis not present

## 2022-07-25 ENCOUNTER — Encounter: Payer: Self-pay | Admitting: Internal Medicine

## 2022-07-25 ENCOUNTER — Other Ambulatory Visit: Payer: Self-pay

## 2022-07-25 ENCOUNTER — Ambulatory Visit: Payer: Self-pay | Admitting: Anesthesiology

## 2022-07-25 ENCOUNTER — Other Ambulatory Visit: Payer: Medicare Other

## 2022-07-25 ENCOUNTER — Encounter
Admission: RE | Disposition: A | Discharge status: Home / Self Care | Payer: Medicare Other | Source: Ambulatory Visit | Attending: Internal Medicine

## 2022-07-25 ENCOUNTER — Ambulatory Visit
Admission: RE | Admit: 2022-07-25 | Discharge: 2022-07-25 | Disposition: A | Discharge status: Home / Self Care | Payer: Medicare Other | Source: Ambulatory Visit | Attending: Internal Medicine | Admitting: Internal Medicine

## 2022-07-25 DIAGNOSIS — Z7902 Long term (current) use of antithrombotics/antiplatelets: Secondary | ICD-10-CM | POA: Insufficient documentation

## 2022-07-25 DIAGNOSIS — Z87442 Personal history of urinary calculi: Secondary | ICD-10-CM | POA: Insufficient documentation

## 2022-07-25 DIAGNOSIS — Z7901 Long term (current) use of anticoagulants: Secondary | ICD-10-CM | POA: Insufficient documentation

## 2022-07-25 DIAGNOSIS — I5030 Unspecified diastolic (congestive) heart failure: Secondary | ICD-10-CM | POA: Diagnosis not present

## 2022-07-25 DIAGNOSIS — I4819 Other persistent atrial fibrillation: Secondary | ICD-10-CM | POA: Insufficient documentation

## 2022-07-25 DIAGNOSIS — N189 Chronic kidney disease, unspecified: Secondary | ICD-10-CM | POA: Diagnosis not present

## 2022-07-25 DIAGNOSIS — Z9884 Bariatric surgery status: Secondary | ICD-10-CM | POA: Insufficient documentation

## 2022-07-25 DIAGNOSIS — R569 Unspecified convulsions: Secondary | ICD-10-CM | POA: Diagnosis not present

## 2022-07-25 DIAGNOSIS — I480 Paroxysmal atrial fibrillation: Secondary | ICD-10-CM | POA: Insufficient documentation

## 2022-07-25 DIAGNOSIS — G8929 Other chronic pain: Secondary | ICD-10-CM | POA: Diagnosis not present

## 2022-07-25 DIAGNOSIS — I2510 Atherosclerotic heart disease of native coronary artery without angina pectoris: Secondary | ICD-10-CM | POA: Insufficient documentation

## 2022-07-25 DIAGNOSIS — Z6841 Body Mass Index (BMI) 40.0 and over, adult: Secondary | ICD-10-CM | POA: Diagnosis not present

## 2022-07-25 DIAGNOSIS — I130 Hypertensive heart and chronic kidney disease with heart failure and stage 1 through stage 4 chronic kidney disease, or unspecified chronic kidney disease: Secondary | ICD-10-CM | POA: Insufficient documentation

## 2022-07-25 DIAGNOSIS — Z87891 Personal history of nicotine dependence: Secondary | ICD-10-CM | POA: Insufficient documentation

## 2022-07-25 DIAGNOSIS — I4891 Unspecified atrial fibrillation: Secondary | ICD-10-CM | POA: Diagnosis not present

## 2022-07-25 DIAGNOSIS — F419 Anxiety disorder, unspecified: Secondary | ICD-10-CM | POA: Insufficient documentation

## 2022-07-25 DIAGNOSIS — M1990 Unspecified osteoarthritis, unspecified site: Secondary | ICD-10-CM | POA: Diagnosis not present

## 2022-07-25 DIAGNOSIS — Z79899 Other long term (current) drug therapy: Secondary | ICD-10-CM | POA: Insufficient documentation

## 2022-07-25 DIAGNOSIS — I509 Heart failure, unspecified: Secondary | ICD-10-CM | POA: Diagnosis not present

## 2022-07-25 HISTORY — PX: CARDIOVERSION: SHX1299

## 2022-07-25 SURGERY — CARDIOVERSION
Anesthesia: General

## 2022-07-25 MED ORDER — SODIUM CHLORIDE 0.9 % IV SOLN: INTRAVENOUS | Status: DC

## 2022-07-25 MED ORDER — PROPOFOL 10 MG/ML IV BOLUS
INTRAVENOUS | Status: AC
  Filled 2022-07-25: qty 40

## 2022-07-25 MED ORDER — PROPOFOL 10 MG/ML IV BOLUS
INTRAVENOUS | Status: DC | PRN
  Administered 2022-07-25: 90 mg via INTRAVENOUS

## 2022-07-25 NOTE — Interval H&P Note (Signed)
History and Physical Interval Note:  07/25/2022 7:38 AM  [name]  has presented today for surgery, with the diagnosis of Cardioversion   Afib DR Graciela Husbands REQUESTING 2 DEFIBRILLATORS.  The various methods of treatment have been discussed with the patient and family. After consideration of risks, benefits and other options for treatment, the patient has consented to  Procedure(s): CARDIOVERSION (N/A) as a surgical intervention.  The patient's history has been reviewed, patient examined, no change in status, stable for surgery.  I have reviewed the patient's chart and labs.  Questions were answered to the patient's satisfaction.     [name]

## 2022-07-25 NOTE — Transfer of Care (Signed)
Immediate Anesthesia Transfer of Care Note  Patient: [name]  Procedure(s) Performed: CARDIOVERSION  Patient Location: Cath Lab Specials  Anesthesia Type:General  Level of Consciousness: awake, alert , and oriented  Airway & Oxygen Therapy: Patient Spontanous Breathing and Patient connected to nasal cannula oxygen  Post-op Assessment: Report given to RN and Post -op Vital signs reviewed and stable  Post vital signs: Reviewed and stable  Last Vitals:  Vitals Value Taken Time  BP 156/103 07/25/22 0745  Temp    Pulse 46 07/25/22 0749  Resp 16 07/25/22 0749  SpO2 93 % 07/25/22 0749  Vitals shown include unvalidated device data.  Last Pain:  Vitals:   07/25/22 0701  TempSrc: Oral  PainSc: 4          Complications: No notable events documented.

## 2022-07-25 NOTE — Anesthesia Preprocedure Evaluation (Signed)
Anesthesia Evaluation  Patient identified by MRN, date of birth, ID band Patient awake    Reviewed: Allergy & Precautions, NPO status , Patient's Chart, lab work & pertinent test results  History of Anesthesia Complications Negative for: history of anesthetic complications  Airway Mallampati: III       Dental  (+) Missing   Pulmonary former smoker   Pulmonary exam normal breath sounds clear to auscultation       Cardiovascular hypertension, + CAD and +CHF (diastolic dysfunction)  + dysrhythmias (a fib on Xarelto)  Rhythm:Irregular Rate:Normal  ECG 05/04/22: A fib, low voltage   Neuro/Psych Seizures -,  PSYCHIATRIC DISORDERS Anxiety     Chronic pain  Neuromuscular disease    GI/Hepatic negative GI ROS,,,  Endo/Other  Class 3 obesity  Renal/GU Renal disease (CKD, nephrolithiasis)     Musculoskeletal  (+) Arthritis ,    Abdominal   Peds  Hematology negative hematology ROS (+)   Anesthesia Other Findings   Reproductive/Obstetrics                              Anesthesia Physical Anesthesia Plan  ASA: 3  Anesthesia Plan: General   Post-op Pain Management:    Induction: Intravenous  PONV Risk Score and Plan: 2 and Propofol infusion, TIVA and Treatment may vary due to age or medical condition  Airway Management Planned: Natural Airway  Additional Equipment:   Intra-op Plan:   Post-operative Plan:   Informed Consent: I have reviewed the patients History and Physical, chart, labs and discussed the procedure including the risks, benefits and alternatives for the proposed anesthesia with the patient or authorized representative who has indicated his/her understanding and acceptance.       Plan Discussed with: CRNA  Anesthesia Plan Comments: (LMA/GETA backup discussed.  Patient consented for risks of anesthesia including but not limited to:  - adverse reactions to medications -  damage to eyes, teeth, lips or other oral mucosa - nerve damage due to positioning  - sore throat or hoarseness - damage to heart, brain, nerves, lungs, other parts of body or loss of life  Informed patient about role of CRNA in peri- and intra-operative care.  Patient voiced understanding.)         Anesthesia Quick Evaluation

## 2022-07-25 NOTE — CV Procedure (Signed)
Preop Dx afib Post op DX  NSR  Procedure  DC Cardioversion   Pt was sedated by anesthesia receiving 90 mg Propafol  A synchronized shock 200  joules restored sinus Rhythm  Pt tolerated without difficulty

## 2022-07-26 ENCOUNTER — Encounter: Payer: Self-pay | Admitting: Internal Medicine

## 2022-07-26 NOTE — Anesthesia Postprocedure Evaluation (Signed)
Anesthesia Post Note  Patient: ASHELY JOSHUA  Procedure(s) Performed: CARDIOVERSION  Patient location during evaluation: Specials Recovery Anesthesia Type: General Level of consciousness: awake and alert Pain management: pain level controlled Vital Signs Assessment: post-procedure vital signs reviewed and stable Respiratory status: spontaneous breathing, nonlabored ventilation, respiratory function stable and patient connected to nasal cannula oxygen Cardiovascular status: blood pressure returned to baseline and stable Postop Assessment: no apparent nausea or vomiting Anesthetic complications: no   No notable events documented.   Last Vitals:  Vitals:   07/25/22 0815 07/25/22 0830  BP: 136/76 138/78  Pulse: (!) 50 (!) 51  Resp: 18 18  Temp:    SpO2: 91% 94%    Last Pain:  Vitals:   07/25/22 0830  TempSrc:   PainSc: 0-No pain                 Louie Boston

## 2022-07-27 ENCOUNTER — Ambulatory Visit: Payer: Medicare Other

## 2022-08-01 ENCOUNTER — Ambulatory Visit: Payer: Medicare Other | Admitting: Family Medicine

## 2022-08-01 ENCOUNTER — Encounter: Payer: Self-pay | Admitting: Family Medicine

## 2022-08-01 ENCOUNTER — Ambulatory Visit: Payer: Medicare Other

## 2022-08-01 VITALS — BP 110/78 | HR 83 | Ht 72.0 in | Wt 342.0 lb

## 2022-08-01 DIAGNOSIS — Z0000 Encounter for general adult medical examination without abnormal findings: Secondary | ICD-10-CM

## 2022-08-01 DIAGNOSIS — M6281 Muscle weakness (generalized): Secondary | ICD-10-CM

## 2022-08-01 DIAGNOSIS — F419 Anxiety disorder, unspecified: Secondary | ICD-10-CM

## 2022-08-01 DIAGNOSIS — I4819 Other persistent atrial fibrillation: Secondary | ICD-10-CM | POA: Diagnosis not present

## 2022-08-01 DIAGNOSIS — I10 Essential (primary) hypertension: Secondary | ICD-10-CM | POA: Diagnosis not present

## 2022-08-01 DIAGNOSIS — E559 Vitamin D deficiency, unspecified: Secondary | ICD-10-CM

## 2022-08-01 DIAGNOSIS — G8929 Other chronic pain: Secondary | ICD-10-CM

## 2022-08-01 DIAGNOSIS — N401 Enlarged prostate with lower urinary tract symptoms: Secondary | ICD-10-CM | POA: Diagnosis not present

## 2022-08-01 DIAGNOSIS — M25512 Pain in left shoulder: Secondary | ICD-10-CM | POA: Diagnosis not present

## 2022-08-01 DIAGNOSIS — E782 Mixed hyperlipidemia: Secondary | ICD-10-CM | POA: Diagnosis not present

## 2022-08-01 DIAGNOSIS — D510 Vitamin B12 deficiency anemia due to intrinsic factor deficiency: Secondary | ICD-10-CM

## 2022-08-01 MED ORDER — DULOXETINE HCL 60 MG PO CPEP: 60.0000 mg | ORAL_CAPSULE | Freq: Every day | ORAL | 1 refills | Status: DC

## 2022-08-01 NOTE — Patient Instructions (Addendum)
Thank you for coming to the office today.  No Rx vitamin C supplement. Okay to take OTC options.  Vitamin D3 completed 12 weeks Now move to OTC dosing for maintenance 1,000 to 2,000 unit per day maintenance on Vitamin D3  Hemoglobin improved  Recent Labs    07/04/22 1224  HGBA1C 5.7*   Lipid Panel     Component Value Date/Time   CHOL 209 (H) 07/04/2022 1224   CHOL 158 08/01/2019 1606   CHOL 195 12/04/2016 0857   TRIG 139 07/04/2022 1224   TRIG 147 12/04/2016 0857   HDL 56 07/04/2022 1224   HDL 73 08/01/2019 1606   CHOLHDL 3.7 07/04/2022 1224   VLDL 28 07/04/2022 1224   VLDL 29 12/04/2016 0857   LDLCALC 125 (H) 07/04/2022 1224   LDLCALC 140 (H) 07/27/2021 0801   LABVLDL 21 08/01/2019 1606   -------------------  For Weight Loss / Obesity only  Contrave - oral medication, appetite suppression has wellbutrin/bupropion and naltrexone in it and it can also help with appetite, it is ordered through a speciality pharmacy. - $99 per month  Free sample 7 day, 1 pill per day for 1 week  Alternative options  Semaglutide injection (mixed Ozempic) from [facility] 0.25mg  weekly for 4 weeks then increase to 0.5mg  weekly It comes in a vial and a needle syringe, you need to draw up the shot and self admin it weekly Cost is about $200 per month Call them to check pricing and availability  [facility] Address: [address] [place], [place] [address] Phone: [phone]  --------------------------------  Doctors office in [place], does not accept insurance. Call to schedule / Walk in to schedule They can do the weekly weight loss injections (same as Ozempic) Pay per visit and per shot. It may be approximately $60-75+ per visit/shot, approximately $200-300 range per month depending  [facility] Address: [address] [place], [place] [address] Phone: [phone]   Please schedule a Follow-up Appointment to: Return in about 6 months  (around 02/01/2023) for 6 month follow-up Anemia, updates from specialist, may need lab Vit D, A1c.  If you have any other questions or concerns, please feel free to call the office or send a message through MyChart. You may also schedule an earlier appointment if necessary.  Additionally, you may be receiving a survey about your experience at our office within a few days to 1 week by e-mail or mail. We value your feedback.  Saralyn Pilar, DO [facility], [place]

## 2022-08-01 NOTE — Progress Notes (Unsigned)
Subjective:    Patient ID: [name], male    DOB: 26-Jan-1955, 67 y.o.   MRN: [id number]  [name] is a 67 y.o. male presenting on 08/01/2022 for Annual Exam (History of A-fib, rheumatologist was prescribing Vit D and recommended double duloxetine)   HPI  Here for Annual Physical and Labs  Hernia surgery deferred, due to weight Labs done and they found anemia, referred to Dr Donneta Romberg, treated for anemia with iron deficiency. - Some wt gain after iron infusion  Persistent AFib - resolved s/p cardioversion Followed by Dr Mariah Milling previously and Dr Graciela Husbands, successfully cardioverted the AFib  Followed by Dr Suzan Nailer Rheumatology On Cymbalta 30mg  daily, and asked if we can adjust dose to double dose 60mg  Not returning to Rheumatology, he will return to Dr Cherylann Ratel for arthritis  Upcoming L shoulder repair, cow tissue before, now needs repeat CT imaging and future surgery repair.  Vitamin D deficiency  Vitamin C Low He will take OTC supplement.  Health Maintenance:  PSA Undetectable < 0.01     08/01/2022    1:22 PM 04/26/2022   11:54 AM 03/30/2022    9:52 AM  Depression screen PHQ 2/9  Decreased Interest 1 2 0  Down, Depressed, Hopeless 0 0 0  PHQ - 2 Score 1 2 0  Altered sleeping 3    Tired, decreased energy 2    Change in appetite 3    Feeling bad or failure about yourself  1    Trouble concentrating 1    Moving slowly or fidgety/restless 0    Suicidal thoughts 0    PHQ-9 Score 11      Past Medical History:  Diagnosis Date   Anxiety    Aortic atherosclerosis (HCC)    Cardiomyopathy (in setting of Afib)    a.) TTE 12/26/2013: EF 45-50%, mild ant and antsept HK. mild MR. Mod dil LA. nl RV fxn. Rhythm was Afib; b.) TTE 07/04/2019: EF 55%, mid-apical anteroseptal HK, mild MAC, mild Ao sclerosis, G2DD, RVSP 45.3   Chronic pain syndrome    a.) followed by pain management   Chronic, continuous use of opioids    a.) hydrocodone/APAP 7.5/325 mg; followed by  pain management   Coronary artery disease 11/06/2014   a.) cCTA 11/06/2014: Ca score 224 (all in pLAD) -- 74th percentile for age/sex matched control   Diverticulosis    DJD (degenerative joint disease) of knee    History of hiatal hernia    History of kidney stones 2012   Hyperlipidemia    Hyperplastic colon polyp    Hypertension    Inguinal hernia    left   Internal hemorrhoids    Intervertebral disc disorder with radiculopathy of lumbosacral region    Long term current use FULL DOSE (325 mg) aspirin    Long term current use of amiodarone    Long term current use of antithrombotics/antiplatelets    a.) rivaroxaban   Lower extremity edema    Mixed hyperlipidemia    Myalgia due to statin    Osteoarthritis    PAF (paroxysmal atrial fibrillation) (HCC)    a.) CHA2DS2VASc = 4 (age, HTN, CHF, vascular disease history);  b.) s/p DCCV 03/13/2014 (200 J x1); c.) s/p DCCV 05/15/2019 (150 J x 1, 200 J x2); d.) rate/rhythm maintained on oral amiodarone + metoprolol succinate; chronically anticoagulated with rivaroxaban   Pernicious anemia    Prostate cancer (HCC)    Pulmonary nodule, right    a. 10/2014  Cardiac CTA: 7mm RLL nodule; b. 04/2015 CT Chest: stable 7mm RLL nodule. No new nodules; 02/2017 CTA Chest: stable, benign, 7mm RLL pulm nodule.   Sleep difficulties    a.) takes melatonin + trazodone PRN   SVT (supraventricular tachycardia)    Tubular adenoma of colon    Past Surgical History:  Procedure Laterality Date   BICEPT TENODESIS Right 09/27/2021   Procedure: Right reverse shoulder arthroplasty, biceps tenodesis;  Surgeon: Signa Kell, MD;  Location: [facility];  Service: Orthopedics;  Laterality: Right;   CARDIOVERSION N/A 05/15/2019   Procedure: CARDIOVERSION;  Surgeon: Antonieta Iba, MD;  Location: [facility];  Service: Cardiovascular;  Laterality: N/A;   CARDIOVERSION N/A 03/13/2014   Procedure: CARDIOVERSION; Location: [facility]; Surgeon: [name], MD   CARDIOVERSION N/A  03/24/2022   Procedure: CARDIOVERSION;  Surgeon: Antonieta Iba, MD;  Location: [facility];  Service: Cardiovascular;  Laterality: N/A;   CARDIOVERSION N/A 05/19/2022   Procedure: CARDIOVERSION;  Surgeon: Antonieta Iba, MD;  Location: [facility];  Service: Cardiovascular;  Laterality: N/A;   CARDIOVERSION N/A 07/25/2022   Procedure: CARDIOVERSION;  Surgeon: Duke Salvia, MD;  Location: [facility];  Service: Cardiovascular;  Laterality: N/A;   CARPAL TUNNEL RELEASE Right 12/23/2014   Procedure: CARPAL TUNNEL RELEASE;  Surgeon: Deeann Saint, MD;  Location: [facility];  Service: Orthopedics;  Laterality: Right;   CARPAL TUNNEL RELEASE Left 01/06/2015   Procedure: CARPAL TUNNEL RELEASE;  Surgeon: Deeann Saint, MD;  Location: [facility];  Service: Orthopedics;  Laterality: Left;   COLONOSCOPY WITH PROPOFOL N/A 10/08/2017   Procedure: COLONOSCOPY WITH PROPOFOL;  Surgeon: Toney Reil, MD;  Location: [facility]COPY;  Service: Gastroenterology;  Laterality: N/A;   COLONOSCOPY WITH PROPOFOL N/A 06/07/2022   Procedure: COLONOSCOPY WITH PROPOFOL;  Surgeon: Toney Reil, MD;  Location: [facility] ENDOSCOPY;  Service: Gastroenterology;  Laterality: N/A;   ENTEROSCOPY  06/07/2022   Procedure: ENTEROSCOPY;  Surgeon: Toney Reil, MD;  Location: [facility] ENDOSCOPY;  Service: Gastroenterology;;   ESOPHAGOGASTRODUODENOSCOPY (EGD) WITH PROPOFOL N/A 06/07/2022   Procedure: ESOPHAGOGASTRODUODENOSCOPY (EGD) WITH PROPOFOL;  Surgeon: Toney Reil, MD;  Location: [facility]COPY;  Service: Gastroenterology;  Laterality: N/A;   GASTRIC BYPASS  01/17/2000   KNEE SURGERY Right    knee trauma x3   prostate seeding     REVERSE SHOULDER ARTHROPLASTY Right 09/27/2021   Procedure: Right reverse shoulder arthroplasty, biceps tenodesis;  Surgeon: Signa Kell, MD;  Location: [facility];  Service: Orthopedics;  Laterality: Right;   SHOULDER ARTHROSCOPY WITH ROTATOR CUFF REPAIR AND OPEN BICEPS TENODESIS Left 12/27/2021    Procedure: Left shoulder arthroscopic cuff repair (supraspinatus and subscapularis) with Regeneten Patch application;  Surgeon: Signa Kell, MD;  Location: [facility];  Service: Orthopedics;  Laterality: Left;   Social History   Socioeconomic History   Marital status: Married    Spouse name: Nelva Bush   Number of children: Not on file   Years of education: Not on file   Highest education level: Not on file  Occupational History   Not on file  Tobacco Use   Smoking status: Former    Current packs/day: 0.00    Average packs/day: 1 pack/day for 10.0 years (10.0 ttl pk-yrs)    Types: Cigarettes    Start date: 02/17/1969    Quit date: 02/18/1979    Years since quitting: 43.4   Smokeless tobacco: Former    Types: Snuff  Vaping Use   Vaping status: Never Used  Substance and Sexual Activity  Alcohol use: Not Currently    Comment: last drink in 2021   Drug use: No   Sexual activity: Not on file  Other Topics Concern   Not on file  Social History Narrative   Lives in [place]; with wife. Retd from [employer]; quit in 1980; used to drink alcohol; no beer in last 2022.    Social Determinants of Health   Financial Resource Strain: Low Risk  (02/24/2022)   Overall Financial Resource Strain (CARDIA)    Difficulty of Paying Living Expenses: Not hard at all  Food Insecurity: No Food Insecurity (04/26/2022)   Hunger Vital Sign    Worried About Running Out of Food in the Last Year: Never true    Ran Out of Food in the Last Year: Never true  Transportation Needs: No Transportation Needs (02/24/2022)   PRAPARE - Administrator, Civil Service (Medical): No    Lack of Transportation (Non-Medical): No  Physical Activity: Insufficiently Active (02/24/2022)   Exercise Vital Sign    Days of Exercise per Week: 2 days    Minutes of Exercise per Session: 60 min  Stress: No Stress Concern Present (02/24/2022)   [employer] of Occupational Health - Occupational Stress Questionnaire     Feeling of Stress : Not at all  Social Connections: Moderately Integrated (02/24/2022)   Social Connection and Isolation Panel [NHANES]    Frequency of Communication with Friends and Family: More than three times a week    Frequency of Social Gatherings with Friends and Family: More than three times a week    Attends Religious Services: More than 4 times per year    Active Member of [employer] or Organizations: No    Attends Banker Meetings: Never    Marital Status: Married  Catering manager Violence: Not At Risk (04/26/2022)   Humiliation, Afraid, Rape, and Kick questionnaire    Fear of Current or Ex-Partner: No    Emotionally Abused: No    Physically Abused: No    Sexually Abused: No   Family History  Problem Relation Age of Onset   Heart disease Father    Hypertension Father    Stroke Father    Cancer Father        prostate cancer   Cancer Sister    Arrhythmia Sister        A-fib   Arrhythmia Brother        A-fib   Cancer Brother    Current Outpatient Medications on File Prior to Visit  Medication Sig   amiodarone (PACERONE) 200 MG tablet Take 200 mg by mouth 2 (two) times daily.   Ascorbic Acid (VITAMIN C PO) Take by mouth.   benazepril (LOTENSIN) 40 MG tablet TAKE ONE TABLET BY MOUTH ONE TIME DAILY   busPIRone (BUSPAR) 10 MG tablet TAKE ONE TABLET BY MOUTH FOUR TIMES A DAY AS NEEDED FOR ANXIETY   carboxymethylcellulose (REFRESH PLUS) 0.5 % SOLN 1 drop 3 (three) times daily as needed.   cyanocobalamin (VITAMIN B12) 1000 MCG/ML injection Inject 1 mL (1,000 mcg total) into the skin every 30 (thirty) days. INJECT EVERY 30 DAYS AS DIRECTED   cyclobenzaprine (FLEXERIL) 10 MG tablet TAKE ONE TABLET BY MOUTH THREE TIMES A DAY AS NEEDED FOR MUSCLE SPASM   ferrous sulfate 325 (65 FE) MG EC tablet Take 325 mg by mouth 3 (three) times daily with meals.   fluticasone (FLONASE) 50 MCG/ACT nasal spray USE TWO SPRAYS IN THE AFFECTED NOSTRIL ONE TIME  DAILY    HYDROcodone-acetaminophen (NORCO) 7.5-325 MG tablet Take 1 tablet by mouth every 6 (six) hours as needed for severe pain. Must last 30 days   melatonin 3 MG TABS tablet Take 3 mg by mouth at bedtime.   metoprolol succinate (TOPROL-XL) 50 MG 24 hr tablet TAKE ONE TABLET BY MOUTH ONE TIME DAILY WITH OR IMMEDIATELY FOLLOWING A MEAL   Multiple Vitamin (MULTIVITAMIN WITH MINERALS) TABS tablet Take 1 tablet by mouth daily.   rivaroxaban (XARELTO) 20 MG TABS tablet TAKE ONE TABLET BY MOUTH ONE TIME DAILY WITH SUPPER   traZODone (DESYREL) 150 MG tablet TAKE ONE TABLET BY MOUTH AT BEDTIME   No current facility-administered medications on file prior to visit.    Review of Systems Per HPI unless specifically indicated above      Objective:    BP 110/78   Pulse 83   Ht 6' (1.829 m)   Wt (!) 342 lb (155.1 kg)   SpO2 95%   BMI 46.38 kg/m   Wt Readings from Last 3 Encounters:  08/01/22 (!) 342 lb (155.1 kg)  07/25/22 (!) 337 lb (152.9 kg)  07/04/22 (!) 338 lb (153.3 kg)    Physical Exam Vitals and nursing note reviewed.  Constitutional:      General: He is not in acute distress.    Appearance: He is well-developed. He is obese. He is not diaphoretic.     Comments: Well-appearing, comfortable, cooperative  HENT:     Head: Normocephalic and atraumatic.  Eyes:     General:        Right eye: No discharge.        Left eye: No discharge.     Conjunctiva/sclera: Conjunctivae normal.  Neck:     Thyroid: No thyromegaly.  Cardiovascular:     Rate and Rhythm: Normal rate and regular rhythm.     Pulses: Normal pulses.     Heart sounds: Normal heart sounds. No murmur heard. Pulmonary:     Effort: Pulmonary effort is normal. No respiratory distress.     Breath sounds: Normal breath sounds. No wheezing or rales.  Musculoskeletal:        General: Normal range of motion.     Cervical back: Normal range of motion and neck supple.  Lymphadenopathy:     Cervical: No cervical adenopathy.  Skin:     General: Skin is warm and dry.     Findings: No erythema or rash.  Neurological:     Mental Status: He is alert and oriented to person, place, and time. Mental status is at baseline.  Psychiatric:        Behavior: Behavior normal.     Comments: Well groomed, good eye contact, normal speech and thoughts      Results for orders placed or performed during the hospital encounter of 07/04/22  Lipid panel  Result Value Ref Range   Cholesterol 209 (H) 0 - 200 mg/dL   Triglycerides 161 <096 mg/dL   HDL 56 >04 mg/dL   Total CHOL/HDL Ratio 3.7 RATIO   VLDL 28 0 - 40 mg/dL   LDL Cholesterol 540 (H) 0 - 99 mg/dL  TSH  Result Value Ref Range   TSH 1.378 0.350 - 4.500 uIU/mL  PSA  Result Value Ref Range   Prostatic Specific Antigen <0.01 0.00 - 4.00 ng/mL  Hemoglobin A1c  Result Value Ref Range   Hgb A1c MFr Bld 5.7 (H) 4.8 - 5.6 %   Mean Plasma Glucose 116.89 mg/dL  Assessment & Plan:   Problem List Items Addressed This Visit     Essential hypertension   Mixed hyperlipidemia   Pernicious anemia   Persistent atrial fibrillation (HCC)   Vitamin D deficiency   Other Visit Diagnoses     Annual physical exam    -  Primary   Benign prostatic hyperplasia with lower urinary tract symptoms, symptom details unspecified       Anxiety       Relevant Medications   DULoxetine (CYMBALTA) 60 MG capsule      Updated Health Maintenance information Reviewed recent lab results with patient Encouraged improvement to lifestyle with diet and exercise Goal of weight loss  History of bariatric surgery  No Rx vitamin C supplement. Okay to take OTC options.  Vitamin D3 completed 12 weeks Now move to OTC dosing for maintenance 1,000 to 2,000 unit per day maintenance on Vitamin D3  Dose increase Duloxetine 30mg  up to 60mg  for better pain relief and control mood.  Hemoglobin improved  Discussion on weight management per AVS.    No orders of the defined types were placed in this  encounter.     Meds ordered this encounter  Medications   DULoxetine (CYMBALTA) 60 MG capsule    Sig: Take 1 capsule (60 mg total) by mouth daily.    Dispense:  90 capsule    Refill:  1      Follow up plan: Return in about 6 months (around 02/01/2023) for 6 month follow-up Anemia, updates from specialist, may need lab Vit D, A1c.  Saralyn Pilar, DO [facility] 08/01/2022, 1:27 PM

## 2022-08-01 NOTE — Therapy (Signed)
OUTPATIENT PHYSICAL THERAPY SHOULDER TREATMENT  Patient Name: [name] MRN: [id number] DOB:11-08-1955, 67 y.o., male Today's Date: 08/03/2022   PT End of Session - 08/03/22 0940     Visit Number 36    Number of Visits 73    Date for PT Re-Evaluation 09/12/22    Authorization Type [employer] Medicare    Authorization Time Period [employer] medicare 2024  [id number] on [place]    PT Start Time 1710    PT Stop Time 1750    PT Time Calculation (min) 40 min    Activity Tolerance Patient tolerated treatment well    Behavior During Therapy WFL for tasks assessed/performed            Past Medical History:  Diagnosis Date   Anxiety    Aortic atherosclerosis (HCC)    Cardiomyopathy (in setting of Afib)    a.) TTE 12/26/2013: EF 45-50%, mild ant and antsept HK. mild MR. Mod dil LA. nl RV fxn. Rhythm was Afib; b.) TTE 07/04/2019: EF 55%, mid-apical anteroseptal HK, mild MAC, mild Ao sclerosis, G2DD, RVSP 45.3   Chronic pain syndrome    a.) followed by pain management   Chronic, continuous use of opioids    a.) hydrocodone/APAP 7.5/325 mg; followed by pain management   Coronary artery disease 11/06/2014   a.) cCTA 11/06/2014: Ca score 224 (all in pLAD) -- 74th percentile for age/sex matched control   Diverticulosis    DJD (degenerative joint disease) of knee    History of hiatal hernia    History of kidney stones 2012   Hyperlipidemia    Hyperplastic colon polyp    Hypertension    Inguinal hernia    left   Internal hemorrhoids    Intervertebral disc disorder with radiculopathy of lumbosacral region    Long term current use FULL DOSE (325 mg) aspirin    Long term current use of amiodarone    Long term current use of antithrombotics/antiplatelets    a.) rivaroxaban   Lower extremity edema    Mixed hyperlipidemia    Myalgia due to statin    Osteoarthritis    PAF (paroxysmal atrial fibrillation) (HCC)    a.) CHA2DS2VASc = 4 (age, HTN, CHF, vascular disease history);  b.) s/p DCCV 03/13/2014  (200 J x1); c.) s/p DCCV 05/15/2019 (150 J x 1, 200 J x2); d.) rate/rhythm maintained on oral amiodarone + metoprolol succinate; chronically anticoagulated with rivaroxaban   Pernicious anemia    Prostate cancer (HCC)    Pulmonary nodule, right    a. 10/2014 Cardiac CTA: 7mm RLL nodule; b. 04/2015 CT Chest: stable 7mm RLL nodule. No new nodules; 02/2017 CTA Chest: stable, benign, 7mm RLL pulm nodule.   Sleep difficulties    a.) takes melatonin + trazodone PRN   SVT (supraventricular tachycardia)    Tubular adenoma of colon    Past Surgical History:  Procedure Laterality Date   BICEPT TENODESIS Right 09/27/2021   Procedure: Right reverse shoulder arthroplasty, biceps tenodesis;  Surgeon: Signa Kell, MD;  Location: [facility];  Service: Orthopedics;  Laterality: Right;   CARDIOVERSION N/A 05/15/2019   Procedure: CARDIOVERSION;  Surgeon: Antonieta Iba, MD;  Location: [facility];  Service: Cardiovascular;  Laterality: N/A;   CARDIOVERSION N/A 03/13/2014   Procedure: CARDIOVERSION; Location: [facility]; Surgeon: [name], MD   CARDIOVERSION N/A 03/24/2022   Procedure: CARDIOVERSION;  Surgeon: Antonieta Iba, MD;  Location: [facility];  Service: Cardiovascular;  Laterality: N/A;   CARDIOVERSION N/A 05/19/2022   Procedure:  CARDIOVERSION;  Surgeon: Antonieta Iba, MD;  Location: [facility];  Service: Cardiovascular;  Laterality: N/A;   CARDIOVERSION N/A 07/25/2022   Procedure: CARDIOVERSION;  Surgeon: Duke Salvia, MD;  Location: [facility];  Service: Cardiovascular;  Laterality: N/A;   CARPAL TUNNEL RELEASE Right 12/23/2014   Procedure: CARPAL TUNNEL RELEASE;  Surgeon: Deeann Saint, MD;  Location: [facility];  Service: Orthopedics;  Laterality: Right;   CARPAL TUNNEL RELEASE Left 01/06/2015   Procedure: CARPAL TUNNEL RELEASE;  Surgeon: Deeann Saint, MD;  Location: [facility];  Service: Orthopedics;  Laterality: Left;   COLONOSCOPY WITH PROPOFOL N/A 10/08/2017   Procedure: COLONOSCOPY WITH PROPOFOL;   Surgeon: Toney Reil, MD;  Location: [facility];  Service: Gastroenterology;  Laterality: N/A;   COLONOSCOPY WITH PROPOFOL N/A 06/07/2022   Procedure: COLONOSCOPY WITH PROPOFOL;  Surgeon: Toney Reil, MD;  Location: [facility] ENDOSCOPY;  Service: Gastroenterology;  Laterality: N/A;   ENTEROSCOPY  06/07/2022   Procedure: ENTEROSCOPY;  Surgeon: Toney Reil, MD;  Location: [facility] ENDOSCOPY;  Service: Gastroenterology;;   ESOPHAGOGASTRODUODENOSCOPY (EGD) WITH PROPOFOL N/A 06/07/2022   Procedure: ESOPHAGOGASTRODUODENOSCOPY (EGD) WITH PROPOFOL;  Surgeon: Toney Reil, MD;  Location: [facility] ENDOSCOPY;  Service: Gastroenterology;  Laterality: N/A;   GASTRIC BYPASS  01/17/2000   KNEE SURGERY Right    knee trauma x3   prostate seeding     REVERSE SHOULDER ARTHROPLASTY Right 09/27/2021   Procedure: Right reverse shoulder arthroplasty, biceps tenodesis;  Surgeon: Signa Kell, MD;  Location: [facility];  Service: Orthopedics;  Laterality: Right;   SHOULDER ARTHROSCOPY WITH ROTATOR CUFF REPAIR AND OPEN BICEPS TENODESIS Left 12/27/2021   Procedure: Left shoulder arthroscopic cuff repair (supraspinatus and subscapularis) with Regeneten Patch application;  Surgeon: Signa Kell, MD;  Location: [facility];  Service: Orthopedics;  Laterality: Left;   Patient Active Problem List   Diagnosis Date Noted   Symptomatic anemia 04/26/2022   Chronic pain of both knees 03/30/2022   Bilateral primary osteoarthritis of knee 03/30/2022   Shortness of breath 03/24/2022   Vitamin D deficiency 02/24/2022   S/p reverse total shoulder arthroplasty 09/27/2021   Lesion of bone of lumbosacral spine (L5) 05/12/2021   Localized osteoarthritis of shoulder regions, bilateral 03/29/2021   Chronic pain of both shoulders 03/29/2021   Drug-induced myopathy 02/21/2021   Trigger finger of right hand 11/15/2020   Prostate cancer (HCC) 07/26/2020   Insomnia 08/01/2019   Primary osteoarthritis of both wrists  02/17/2019   Trigger middle finger of left hand 02/17/2019   Chronic pain syndrome 02/17/2019   Chronic radicular lumbar pain 10/08/2018   Lumbar radiculopathy 10/08/2018   Lumbar degenerative disc disease 10/08/2018   Lumbar facet arthropathy 10/08/2018   Lumbar facet joint syndrome 10/08/2018   Intervertebral disc disorder with radiculopathy of lumbosacral region    Osteoarthritis of knee 11/30/2017   Osteoarthritis of wrist 11/30/2017   Pernicious anemia 05/22/2016   Persistent atrial fibrillation (HCC) 12/20/2015   Carpal tunnel syndrome 11/16/2014   DJD (degenerative joint disease) of knee 10/27/2014   Bilateral carpal tunnel syndrome 10/27/2014   Morbid obesity with BMI of 50.0-59.9, adult (HCC) 10/27/2014   Mixed hyperlipidemia 10/27/2014   H/O gastric bypass 03/20/2014   Hyperkalemia 03/20/2014   History of prostate cancer 12/26/2013   Essential hypertension 12/26/2013   Encounter for anticoagulation discussion and counseling 12/26/2013   PCP: Smitty Cords, DO  REFERRING PROVIDER: Signa Kell, MD  REFERRING DIAGNOSIS: Incomplete tear of left rotator cuff, unspecified whether traumatic M75.112   THERAPY DIAG:  Chronic left shoulder pain  Chronic right shoulder pain  Muscle weakness (generalized)  RATIONALE FOR EVALUATION AND TREATMENT: Rehabilitation  ONSET DATE: Surgery 12/27/21, Chronic L shoulder pain for multiple years  FOLLOW UP APPT WITH PROVIDER: Yes    FROM INITIAL EVALUATION SUBJECTIVE:                                                                                                                                                                                         Chief Complaint: S/p L shoulder RTC repair, biceps tenodesis, and shoulder debridement  Pertinent History Pt with history of chronic bilateral shoulder pain. He underwent right reverse TSR and right biceps tenodesis on 09/27/21. He experienced post-operative instability in R  shoulder however it improved and he was able to avoid a revision. On 12/27/21 he underwent left arthroscopic rotator cuff repair (subscapularis (full-thickness), supraspinatus (partial-thickness) side-to-side repair + Regeneten patch), left arthroscopic biceps tenodesis, and left extensive debridement of shoulder (glenohumeral and subacromial spaces). Per surgical note other intraoperative findings include Type 2 SLAP tear, degenerative anterior labral tearing, grade 2 degenerative changes to articular cartilage of humeral head, and significant posterior capsule synovitis. No significant post-operative complications. He arrives without abduction pillow and states that Dr. Allena Katz advised him he doesn't have to wear the abduction pillow. He got behind on his pain medication today and states that today his shoulder has been the most painful he has experienced since surgery. He also noticed swelling in L lateral upper arm this morning. He did take his pain medication at 14:00, before PT evaluation.  He has a history of chronic back pain and underwent a lumbar facet, medial branch radiofrequency ablation on 01/02/22. At the time of the evaluation pt has not yet appreciated improvement in his back pain from this procedure. He has a past medical history of Cardiomyopathy (in setting of Afib), DJD (degenerative joint disease) of knee, History of kidney stones, Intervertebral disc disorder with radiculopathy of lumbosacral region, Kidney stone (2012), Lower extremity edema, PAF (paroxysmal atrial fibrillation) (HCC), Pernicious anemia, Prostate cancer (HCC), Pulmonary nodule, right, and SVT (supraventricular tachycardia) (HCC). He also  has a past surgical history that includes Knee surgery; Gastric bypass (2002); prostate seeding; Carpal tunnel release (Right, 12/23/2014); Carpal tunnel release (Left, 01/06/2015); Colonoscopy with propofol (N/A, 10/08/2017); and Cardioversion (N/A, 05/15/2019).   Pain:  Pain Intensity:  Present: 5/10, Best: 3/10, Worst: 10/10 Pain location: Anterior L shoulder Pain Quality: aching and throbbing Radiating: No  Numbness/Tingling: No Focal Weakness: Yes History of prior shoulder or neck/shoulder injury, pain, surgery, or therapy: Yes, chronic bilateral shoulder pain with R TSR. 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 No AROM or WB through LUE (per protocol), Passive elevation and ER in staged ROM to begin at week 4 (elevation 90 degrees weeks 4-6 with no ER; 0-120 weeks 6-8 with ER 0-30 degrees). No pulleys or cane until after 6 weeks, recommend no driving x 6-8 weeks.   Weight Bearing Restrictions: Yes No WB through LUE for at least 6 weeks  Living Environment Lives with: lives with their spouse Lives in: House/apartment  Patient Goals: Improve L shoulder strength and pain. Pt would like to be able to play with/lift his grandson as well as take care of his property.  OBJECTIVE:   Patient Surveys  FOTO: To be completed QuickDASH: 47.7%  Cognition Patient is oriented to person, place, and time.  Recent memory is intact.  Remote memory is intact.  Attention span and concentration are intact.  Expressive speech is intact.  Patient's fund of knowledge is within normal limits for educational level.    Gross Musculoskeletal Assessment Tremor: None Bulk: Mild swelling noted in L lateral upper arm. Pt denies chills, fevers, redness, or dischrage from inicisions Tone:  Normal  Posture Forward head and rounded shoulders  AROM Full L wrist AROM flexion, extension, radial deviation, and ulnar deviation. MCP flexion and extension WNL. Unable to assess L shoulder and elbow AROM per protocol  PROM Full L elbow PROM flexion and extension however during first attempt at slow passive extension pt reports sudden pain in L shoulder which eventually improve after a couple minutes of rest. Full L elbow PROM pronation/supination.   LE MMT: Full L wrist strength for flexion, extension, radial deviation, and ulnar deviation. L grip strength is intact and strong. Deferred testing of L elbow and shoulder strength per protocol  Sensation Grossly intact to light touch throughout LUE as determined by testing dermatomes C2-T2. Proprioception and hot/cold testing deferred on this date.  Palpation Pt is generally tender to light palpation around anterior, lateral, and posterior L shoulder;    TODAY'S TREATMENT    SUBJECTIVE: Pt reports his R shoulder continues doing well. He received the results of his L shoulder MRI and it showed large full-thickness retracted rotator cuff tears involving the supraspinatus and subscapularis tendons as well as significant/severe fatty atrophy of the supraspinatus and subscapularis muscles. He also has advanced glenohumeral joint degenerative changes, degenerated and likely torn glenoid labrum, and a large amount of complex fluid in the subacromial/subdeltoid/subcoracoid bursa. Orthopedist ordered a L shoulder CT and pt plans to follow-up to discuss imaging and plan of care. Since his last therapy session he underwent a successful cardioversion with Dr. Graciela Husbands which returned his heart to NSR. He also had his CPE with Dr. Althea Charon.   PAIN: L shoulder pain   Ther-ex  All exercises performed sitting fully upright today unless otherwise specified: L shoulder AROM flexion with straight elbow from neutral to around 130 degrees with no weight 3  x 10; L shoulder AROM scaption with straight elbow from neutral to around 110 degrees with no weight, 3 x 10; L shoulder IR from vertical to stomach with light manual resistance 3 x 10,  no pain reported; L shoulder ER from stomach to vertical with light manual resistance 3 x 10, no pain reported;  L elbow manually resisted flexion 3 x 10; L elbow manually resisted extension 3 x 10; Seated chest press with cane 3 x 10; Seated rows with cane 3 x 10;   PATIENT EDUCATION:  Education details:  Pt educated throughout session about proper posture and technique with exercises. Improved exercise technique, movement at target joints, use of target muscles after min to mod verbal, visual, tactile cues. Need for orthopedic follow-up. Plan to continue strengthening until plan of care is defined. Person educated: Patient Education method: Explanation, verbal cues Education comprehension: verbalized understanding and returned demonstration   HOME EXERCISE PROGRAM: Access Code: [email or web address] URL: [email or web address] Date: 03/28/2022 Prepared by: Ria Comment  Exercises - Wrist Flexion AROM  - 1 x daily - 7 x weekly - 2 sets - 10 reps - 3s hold - Wrist Extension AROM  - 1 x daily - 7 x weekly - 2 sets - 10 reps - 3s hold - Seated Scapular Retraction  - 1 x daily - 7 x weekly - 2 sets - 10 reps - 3s hold - Standing Elbow Flexion Extension AROM  - 1 x daily - 7 x weekly - 2 sets - 10 reps - 3s hold - Standing Shoulder Flexion to 90 Degrees  - 1 x daily - 7 x weekly - 2 sets - 10 reps - 3s hold - Shoulder Abduction - Thumbs Up  - 1 x daily - 7 x weekly - 2 sets - 10 reps - 3s hold   ASSESSMENT:  CLINICAL IMPRESSION: MRI showed significant RTC tearing and degenerative changes to L GH joint. He is awaiting a L shoulder CT. Focused on LUE strengthening today. No excessive increase in pain during session today. Maintained minimal resistance in order to prevent inflammation or edema in L  shoulder. Plan to progress LUE as pt is able to tolerate without significant increase in pain. Pt will need to follow-up with orthopedist to clarify plan moving forward. Pt encouraged to continue HEP and follow-up as scheduled. He will benefit from skilled PT to address deficits in range of motion and strength in order to improve overall function at home and with leisure activities such as caring for his property.  REHAB POTENTIAL: Good  CLINICAL DECISION MAKING: Evolving/moderate complexity  EVALUATION COMPLEXITY: Moderate   GOALS: Goals reviewed with patient? Yes  SHORT TERM GOALS: Target date: 05/09/2022   Pt will be independent with HEP to improve strength and decrease right shoulder pain to improve pain-free function at home and work. Baseline:  Goal status: ACHIEVED   LONG TERM GOALS: Target date: 09/12/2022   Pt will increase L shoulder FOTO to at least 51 to demonstrate significant improvement in function at home and with leisure activities related to L shoulder. Baseline: 01/03/22: To be completed; 01/24/22: 4; 02/28/22: 59; 04/11/22: 55; 06/15/22: 51 Goal status: ACHIEVED  2.  Pt will decrease worst L shoulder pain by at least 3 points on the NPRS in order to demonstrate clinically significant reduction in R shoulder pain. Baseline: 01/03/22: worst: 10/10; 02/28/22: 10/10; 04/11/22: 4/10; 06/15/22: 9/10; Goal status: ONGOING  3.  Pt will decrease quick DASH score by at least 8% in order to demonstrate clinically significant reduction in disability related to L shoulder pain        Baseline: 01/03/22: 47.7%; 02/28/22: 65.91%; 04/11/22: 56.8%; 06/15/22: 56.8% Goal status: ONGOING  4. Pt will demonstrate  L shoulder flexion, abduction, IR, and ER strength of at least 4/5 in order to demonstrate improvement in strength and function       Baseline: 01/03/22: Unable to test per protocol; 02/28/22: At least 3/5 but unable to fully test due to protocol; 04/11/22: flexion: 4/5, abduction: 4-/5 (IR  and ER at least 3/5 but not tested); 06/15/22: flexion: 4/5 (painful), abduction: 4-/5 (painful), IR (seated): 5/5, ER (seated): 4+/5 (painful); Goal status: PARTIALLY MET  PLAN: PT FREQUENCY: 1-2x/week  PT DURATION: 12 weeks  PLANNED INTERVENTIONS: Therapeutic exercises, Therapeutic activity, Neuromuscular re-education, Balance training, Gait training, Patient/Family education, Joint manipulation, Joint mobilization, Vestibular training, Canalith repositioning, Aquatic Therapy, Dry Needling, Electrical stimulation, Spinal manipulation, Spinal mobilization, Cryotherapy, Moist heat, Traction, Ultrasound, Ionotophoresis 4mg /ml Dexamethasone, and Manual therapy  PLAN FOR NEXT SESSION: Progress L shoulder ROM and strength per protocol;   Lynnea Maizes PT, DPT, GCS  Physical Therapist- [place]  [facility]

## 2022-08-02 ENCOUNTER — Encounter: Payer: Self-pay | Admitting: Student in an Organized Health Care Education/Training Program

## 2022-08-02 ENCOUNTER — Ambulatory Visit
Payer: Medicare Other | Attending: Student in an Organized Health Care Education/Training Program | Admitting: Student in an Organized Health Care Education/Training Program

## 2022-08-02 ENCOUNTER — Encounter: Payer: Self-pay | Admitting: Family Medicine

## 2022-08-02 VITALS — BP 131/87 | HR 63 | Temp 97.3°F | Resp 18 | Ht 72.0 in | Wt 340.0 lb

## 2022-08-02 DIAGNOSIS — M65331 Trigger finger, right middle finger: Secondary | ICD-10-CM | POA: Diagnosis not present

## 2022-08-02 MED ORDER — DEXAMETHASONE SODIUM PHOSPHATE 10 MG/ML IJ SOLN
INTRAMUSCULAR | Status: AC
  Filled 2022-08-02: qty 1

## 2022-08-02 MED ORDER — DEXAMETHASONE SODIUM PHOSPHATE 10 MG/ML IJ SOLN: 10.0000 mg | Freq: Once | INTRAMUSCULAR | Status: DC

## 2022-08-02 MED ORDER — LIDOCAINE HCL 2 % IJ SOLN
INTRAMUSCULAR | Status: AC
  Filled 2022-08-02: qty 20

## 2022-08-02 MED ORDER — LIDOCAINE HCL (PF) 2 % IJ SOLN: 5.0000 mL | Freq: Once | INTRAMUSCULAR | Status: DC

## 2022-08-02 NOTE — Progress Notes (Signed)
Safety precautions to be maintained throughout the outpatient stay will include: orient to surroundings, keep bed in low position, maintain call bell within reach at all times, provide assistance with transfer out of bed and ambulation.  

## 2022-08-02 NOTE — Patient Instructions (Signed)

## 2022-08-02 NOTE — Progress Notes (Signed)
PROVIDER NOTE: Interpretation of information contained herein should be left to medically-trained personnel. Specific patient instructions are provided elsewhere under "Patient Instructions" section of medical record. This document was created in part using STT-dictation technology, any transcriptional errors that may result from this process are unintentional.  Patient: [name] Type: Established DOB: [birth year] MRN: [id number] PCP: Smitty Cords, DO  Service: Procedure DOS: 08/02/2022 Setting: Ambulatory Location: Ambulatory outpatient facility Delivery: Face-to-face Provider: Edward Jolly, MD Specialty: Interventional Pain Management Specialty designation: 09 Location: Outpatient facility Ref. Prov.: Saralyn Pilar *       Interventional Therapy   Primary Reason for Visit: Interventional Pain Management Treatment. CC: Hand Pain (Right middle finger) and Joint Pain    Procedure:          Anesthesia, Analgesia, Anxiolysis:  Type: Trigger Finger Ligament/Tendon sheath (20550) Injection.           Purpose: Therapeutic Target Area: Flexor Digitorum Tendon sheath nodule Region: A-2 and A-4 pulley of the metacarpal area Approach: Percutaneous Digit: No:3(Middle) Finger Laterality: Right-hand  Type: Local Anesthesia Local Anesthetic: Lidocaine 1-2% Sedation: None  Indication(s):  Analgesia Route: Infiltration (/IM) IV Access: N/A   Position: Sitting   1. Trigger middle finger of right hand     NAS-11 Pain score:   Pre-procedure: 5 /10   Post-procedure: 0-No pain/10     Pre-op H&P Assessment:  [name] is a 67 y.o. (year old), male patient, seen today for interventional treatment. He  has a past surgical history that includes Knee surgery (Right); Gastric bypass (01/17/2000); prostate seeding; Carpal tunnel release (Right, 12/23/2014); Carpal tunnel release (Left, 01/06/2015); Colonoscopy with propofol (N/A, 10/08/2017); Cardioversion (N/A,  05/15/2019); Cardioversion (N/A, 03/13/2014); Reverse shoulder arthroplasty (Right, 09/27/2021); Bicept tenodesis (Right, 09/27/2021); Shoulder arthroscopy with rotator cuff repair and open biceps tenodesis (Left, 12/27/2021); Cardioversion (N/A, 03/24/2022); Cardioversion (N/A, 05/19/2022); Colonoscopy with propofol (N/A, 06/07/2022); Esophagogastroduodenoscopy (egd) with propofol (N/A, 06/07/2022); enteroscopy (06/07/2022); and Cardioversion (N/A, 07/25/2022). [name] has a current medication list which includes the following prescription(s): amiodarone, ascorbic acid, benazepril, buspirone, carboxymethylcellulose, cyanocobalamin, cyclobenzaprine, duloxetine, ferrous sulfate, fluticasone, hydrocodone-acetaminophen, melatonin, metoprolol succinate, multivitamin with minerals, xarelto, and trazodone, and the following Facility-Administered Medications: dexamethasone and lidocaine hcl (pf). His primarily concern today is the Hand Pain (Right middle finger) and Joint Pain  Initial Vital Signs:  Pulse/HCG Rate: 84  Temp: (!) 97.3 F (36.3 C) Resp: 16 BP: 137/74 SpO2: 98 %  BMI: Estimated body mass index is 46.11 kg/m as calculated from the following:   Height as of this encounter: 6' (1.829 m).   Weight as of this encounter: 340 lb (154.2 kg).  Risk Assessment: Allergies: Reviewed. He is allergic to bupivacaine liposome.  Allergy Precautions: None required Coagulopathies: Reviewed. None identified.  Blood-thinner therapy: None at this time Active Infection(s): Reviewed. None identified. [name] is afebrile  Site Confirmation: [name] was asked to confirm the procedure and laterality before marking the site Procedure checklist: Completed Consent: Before the procedure and under the influence of no sedative(s), amnesic(s), or anxiolytics, the patient was informed of the treatment options, risks and possible complications. To fulfill our ethical and legal obligations, as recommended by the American  Medical Association's Code of Ethics, I have informed the patient of my clinical impression; the nature and purpose of the treatment or procedure; the risks, benefits, and possible complications of the intervention; the alternatives, including doing nothing; the risk(s) and benefit(s) of the alternative treatment(s) or procedure(s); and the risk(s) and benefit(s) of doing  nothing. The patient was provided information about the general risks and possible complications associated with the procedure. These may include, but are not limited to: failure to achieve desired goals, infection, bleeding, organ or nerve damage, allergic reactions, paralysis, and death. In addition, the patient was informed of those risks and complications associated to the procedure, such as failure to decrease pain; infection; bleeding; organ or nerve damage with subsequent damage to sensory, motor, and/or autonomic systems, resulting in permanent pain, numbness, and/or weakness of one or several areas of the body; allergic reactions; (i.e.: anaphylactic reaction); and/or death. Furthermore, the patient was informed of those risks and complications associated with the medications. These include, but are not limited to: allergic reactions (i.e.: anaphylactic or anaphylactoid reaction(s)); adrenal axis suppression; blood sugar elevation that in diabetics may result in ketoacidosis or comma; water retention that in patients with history of congestive heart failure may result in shortness of breath, pulmonary edema, and decompensation with resultant heart failure; weight gain; swelling or edema; medication-induced neural toxicity; particulate matter embolism and blood vessel occlusion with resultant organ, and/or nervous system infarction; and/or aseptic necrosis of one or more joints. Finally, the patient was informed that Medicine is not an exact science; therefore, there is also the possibility of unforeseen or unpredictable risks and/or  possible complications that may result in a catastrophic outcome. The patient indicated having understood very clearly. We have given the patient no guarantees and we have made no promises. Enough time was given to the patient to ask questions, all of which were answered to the patient's satisfaction. Mr. Lundberg has indicated that he wanted to continue with the procedure. Attestation: I, the ordering provider, attest that I have discussed with the patient the benefits, risks, side-effects, alternatives, likelihood of achieving goals, and potential problems during recovery for the procedure that I have provided informed consent. Date  Time: 08/02/2022  1:11 PM  Pre-Procedure Preparation:  Monitoring: As per clinic protocol. Respiration, ETCO2, SpO2, BP, heart rate and rhythm monitor placed and checked for adequate function Safety Precautions: Patient was assessed for positional comfort and pressure points before starting the procedure. Time-out: I initiated and conducted the "Time-out" before starting the procedure, as per protocol. The patient was asked to participate by confirming the accuracy of the "Time Out" information. Verification of the correct person, site, and procedure were performed and confirmed by me, the nursing staff, and the patient. "Time-out" conducted as per Joint Commission's Universal Protocol (UP.01.01.01). Time: 1335 Start Time: 1335 hrs.  Description of Procedure:          Area Prepped: Entire palmar and dorsal aspect of hand, up to forearm area. DuraPrep (Iodine Povacrylex [0.7% available iodine] and Isopropyl Alcohol, 74% w/w) Safety Precautions: Aspiration looking for blood return was conducted prior to all injections. At no point did we inject any substances, as a needle was being advanced. No attempts were made at seeking any paresthesias. Safe injection practices and needle disposal techniques used. Medications properly checked for expiration dates. SDV (single dose vial)  medications used. Description of the Procedure: Protocol guidelines were followed. The patient was placed in position. The target area was identified and prepped in the usual manner. Skin & deeper tissues infiltrated with local anesthetic. Appropriate time provided for local anesthetics to take effect. The procedure needle was slowly advanced to target area. Proper needle placement secured. Negative aspiration confirmed. Solution injected in intermittent fashion, asking for systemic symptoms every 0.5cc. Needle(s) removed and area cleaned, making sure to leave some  prepping solution back to take advantage of its long term bactericidal properties.  Vitals:   08/02/22 1320 08/02/22 1343  BP: 137/74 131/87  Pulse: 84 63  Resp: 16 18  Temp: (!) 97.3 F (36.3 C)   SpO2: 98% 97%  Weight: (!) 340 lb (154.2 kg)   Height: 6' (1.829 m)     Start Time: 1335 hrs. End Time: 1337 hrs. Materials:  Needle(s) Type: Regular needle Gauge: 25G Length: 1.5-in Medication(s): Please see orders for medications and dosing details.  3 cc solution made of 2 cc of 2% Lidocaine, 1 cc of Decadron 10 mg/cc.  1.5 cc injected for the A1 pulley, 1.5 cc injected for the A3 pulley for right middle finger.     Post-operative Assessment:  Post-procedure Vital Signs:  Pulse/HCG Rate: 63  Temp: (!) 97.3 F (36.3 C) Resp: 18 BP: 131/87 SpO2: 97 %  EBL: None  Complications: No immediate post-treatment complications observed by team, or reported by patient.  Note: The patient tolerated the entire procedure well. A repeat set of vitals were taken after the procedure and the patient was kept under observation following institutional policy, for this type of procedure. Post-procedural neurological assessment was performed, showing return to baseline, prior to discharge. The patient was provided with post-procedure discharge instructions, including a section on how to identify potential problems. Should any problems arise  concerning this procedure, the patient was given instructions to immediately contact us, at any time, without hesitation. In any case, we plan to contact the patient by telephone for a follow-up status report regarding this interventional procedure.  Comments:  No additional relevant information.  Plan of Care (POC)  Orders:  No orders of the defined types were placed in this encounter.  Chronic Opioid Analgesic:   hydrocodone 7.5 mg 3 times daily as needed     Medications ordered for procedure: Meds ordered this encounter  Medications   dexamethasone (DECADRON) injection 10 mg   lidocaine HCl (PF) (XYLOCAINE) 2 % injection 5 mL   Medications administered: Ruel Favors. Wronski had no medications administered during this visit.  See the medical record for exact dosing, route, and time of administration.  Follow-up plan:   Return for Keep sch. appt.       Recent Visits Date Type Provider Dept  07/11/22 Office Visit Edward Jolly, MD [facility]  06/28/22 Office Visit Edward Jolly, MD [facility]  Showing recent visits within past 90 days and meeting all other requirements Today's Visits Date Type Provider Dept  08/02/22 Procedure visit Edward Jolly, MD [facility]  Showing today's visits and meeting all other requirements Future Appointments Date Type Provider Dept  08/15/22 Appointment Edward Jolly, MD [facility]  Showing future appointments within next 90 days and meeting all other requirements  Disposition: Discharge home  Discharge (Date  Time): 08/02/2022; 1344 hrs.   Primary Care Physician: Smitty Cords, DO Location: [facility] Note by: Edward Jolly, MD (TTS technology used. I apologize for any typographical errors that were not detected and corrected.) Date: 08/02/2022; Time: 1:55 PM  Disclaimer:  Medicine is not an Visual merchandiser. The only guarantee in medicine is that nothing is  guaranteed. It is important to note that the decision to proceed with this intervention was based on the information collected from the patient. The Data and conclusions were drawn from the patient's questionnaire, the interview, and the physical examination. Because the information was provided in large part by the  patient, it cannot be guaranteed that it has not been purposely or unconsciously manipulated. Every effort has been made to obtain as much relevant data as possible for this evaluation. It is important to note that the conclusions that lead to this procedure are derived in large part from the available data. Always take into account that the treatment will also be dependent on availability of resources and existing treatment guidelines, considered by other Pain Management Practitioners as being common knowledge and practice, at the time of the intervention. For Medico-Legal purposes, it is also important to point out that variation in procedural techniques and pharmacological choices are the acceptable norm. The indications, contraindications, technique, and results of the above procedure should only be interpreted and judged by a Board-Certified Interventional Pain Specialist with extensive familiarity and expertise in the same exact procedure and technique.

## 2022-08-03 ENCOUNTER — Ambulatory Visit: Payer: Medicare Other

## 2022-08-03 ENCOUNTER — Telehealth: Payer: Self-pay | Admitting: *Deleted

## 2022-08-03 DIAGNOSIS — M6281 Muscle weakness (generalized): Secondary | ICD-10-CM

## 2022-08-03 DIAGNOSIS — G8929 Other chronic pain: Secondary | ICD-10-CM

## 2022-08-03 DIAGNOSIS — M25512 Pain in left shoulder: Secondary | ICD-10-CM | POA: Diagnosis not present

## 2022-08-03 NOTE — Therapy (Unsigned)
OUTPATIENT PHYSICAL THERAPY SHOULDER TREATMENT  Patient Name: [name] MRN: [id number] DOB:[birth year], 67 y.o., male Today's Date: 08/05/2022   PT End of Session - 08/05/22 1126     Visit Number 37    Number of Visits 73    Date for PT Re-Evaluation 09/12/22    Authorization Type [employer] Medicare    Authorization Time Period [employer] medicare 2024  [place] on [place]    PT Start Time 1530    PT Stop Time 1600    PT Time Calculation (min) 30 min    Activity Tolerance Patient tolerated treatment well    Behavior During Therapy WFL for tasks assessed/performed            Past Medical History:  Diagnosis Date   Anxiety    Aortic atherosclerosis (HCC)    Cardiomyopathy (in setting of Afib)    a.) TTE 12/26/2013: EF 45-50%, mild ant and antsept HK. mild MR. Mod dil LA. nl RV fxn. Rhythm was Afib; b.) TTE 07/04/2019: EF 55%, mid-apical anteroseptal HK, mild MAC, mild Ao sclerosis, G2DD, RVSP 45.3   Chronic pain syndrome    a.) followed by pain management   Chronic, continuous use of opioids    a.) hydrocodone/APAP 7.5/325 mg; followed by pain management   Coronary artery disease 11/06/2014   a.) cCTA 11/06/2014: Ca score 224 (all in pLAD) -- 74th percentile for age/sex matched control   Diverticulosis    DJD (degenerative joint disease) of knee    History of hiatal hernia    History of kidney stones 2012   Hyperlipidemia    Hyperplastic colon polyp    Hypertension    Inguinal hernia    left   Internal hemorrhoids    Intervertebral disc disorder with radiculopathy of lumbosacral region    Long term current use FULL DOSE (325 mg) aspirin    Long term current use of amiodarone    Long term current use of antithrombotics/antiplatelets    a.) rivaroxaban   Lower extremity edema    Mixed hyperlipidemia    Myalgia due to statin    Osteoarthritis    PAF (paroxysmal atrial fibrillation) (HCC)    a.) CHA2DS2VASc = 4 (age, HTN, CHF, vascular disease history);  b.) s/p DCCV 03/13/2014  (200 J x1); c.) s/p DCCV 05/15/2019 (150 J x 1, 200 J x2); d.) rate/rhythm maintained on oral amiodarone + metoprolol succinate; chronically anticoagulated with rivaroxaban   Pernicious anemia    Prostate cancer (HCC)    Pulmonary nodule, right    a. 10/2014 Cardiac CTA: 7mm RLL nodule; b. 04/2015 CT Chest: stable 7mm RLL nodule. No new nodules; 02/2017 CTA Chest: stable, benign, 7mm RLL pulm nodule.   Sleep difficulties    a.) takes melatonin + trazodone PRN   SVT (supraventricular tachycardia)    Tubular adenoma of colon    Past Surgical History:  Procedure Laterality Date   BICEPT TENODESIS Right 09/27/2021   Procedure: Right reverse shoulder arthroplasty, biceps tenodesis;  Surgeon: Signa Kell, MD;  Location: [facility];  Service: Orthopedics;  Laterality: Right;   CARDIOVERSION N/A 05/15/2019   Procedure: CARDIOVERSION;  Surgeon: Antonieta Iba, MD;  Location: [facility];  Service: Cardiovascular;  Laterality: N/A;   CARDIOVERSION N/A 03/13/2014   Procedure: CARDIOVERSION; Location: [facility]; Surgeon: [name], MD   CARDIOVERSION N/A 03/24/2022   Procedure: CARDIOVERSION;  Surgeon: Antonieta Iba, MD;  Location: [facility];  Service: Cardiovascular;  Laterality: N/A;   CARDIOVERSION N/A 05/19/2022   Procedure:  CARDIOVERSION;  Surgeon: Antonieta Iba, MD;  Location: [facility];  Service: Cardiovascular;  Laterality: N/A;   CARDIOVERSION N/A 07/25/2022   Procedure: CARDIOVERSION;  Surgeon: Duke Salvia, MD;  Location: [facility];  Service: Cardiovascular;  Laterality: N/A;   CARPAL TUNNEL RELEASE Right 12/23/2014   Procedure: CARPAL TUNNEL RELEASE;  Surgeon: Deeann Saint, MD;  Location: [facility];  Service: Orthopedics;  Laterality: Right;   CARPAL TUNNEL RELEASE Left 01/06/2015   Procedure: CARPAL TUNNEL RELEASE;  Surgeon: Deeann Saint, MD;  Location: [facility];  Service: Orthopedics;  Laterality: Left;   COLONOSCOPY WITH PROPOFOL N/A 10/08/2017   Procedure: COLONOSCOPY WITH PROPOFOL;   Surgeon: Toney Reil, MD;  Location: [facility];  Service: Gastroenterology;  Laterality: N/A;   COLONOSCOPY WITH PROPOFOL N/A 06/07/2022   Procedure: COLONOSCOPY WITH PROPOFOL;  Surgeon: Toney Reil, MD;  Location: [facility] ENDOSCOPY;  Service: Gastroenterology;  Laterality: N/A;   ENTEROSCOPY  06/07/2022   Procedure: ENTEROSCOPY;  Surgeon: Toney Reil, MD;  Location: [facility] ENDOSCOPY;  Service: Gastroenterology;;   ESOPHAGOGASTRODUODENOSCOPY (EGD) WITH PROPOFOL N/A 06/07/2022   Procedure: ESOPHAGOGASTRODUODENOSCOPY (EGD) WITH PROPOFOL;  Surgeon: Toney Reil, MD;  Location: [facility] END[facility]COPY;  Service: Gastroenterology;  Laterality: N/A;   GASTRIC BYPASS  01/17/2000   KNEE SURGERY Right    knee trauma x3   prostate seeding     REVERSE SHOULDER ARTHROPLASTY Right 09/27/2021   Procedure: Right reverse shoulder arthroplasty, biceps tenodesis;  Surgeon: Signa Kell, MD;  Location: [facility];  Service: Orthopedics;  Laterality: Right;   SHOULDER ARTHROSCOPY WITH ROTATOR CUFF REPAIR AND OPEN BICEPS TENODESIS Left 12/27/2021   Procedure: Left shoulder arthroscopic cuff repair (supraspinatus and subscapularis) with Regeneten Patch application;  Surgeon: Signa Kell, MD;  Location: [facility];  Service: Orthopedics;  Laterality: Left;   Patient Active Problem List   Diagnosis Date Noted   Symptomatic anemia 04/26/2022   Chronic pain of both knees 03/30/2022   Bilateral primary osteoarthritis of knee 03/30/2022   Shortness of breath 03/24/2022   Vitamin D deficiency 02/24/2022   S/p reverse total shoulder arthroplasty 09/27/2021   Lesion of bone of lumbosacral spine (L5) 05/12/2021   Localized osteoarthritis of shoulder regions, bilateral 03/29/2021   Chronic pain of both shoulders 03/29/2021   Drug-induced myopathy 02/21/2021   Trigger finger of right hand 11/15/2020   Prostate cancer (HCC) 07/26/2020   Insomnia 08/01/2019   Primary osteoarthritis of both wrists  02/17/2019   Trigger middle finger of left hand 02/17/2019   Chronic pain syndrome 02/17/2019   Chronic radicular lumbar pain 10/08/2018   Lumbar radiculopathy 10/08/2018   Lumbar degenerative disc disease 10/08/2018   Lumbar facet arthropathy 10/08/2018   Lumbar facet joint syndrome 10/08/2018   Intervertebral disc disorder with radiculopathy of lumbosacral region    Osteoarthritis of knee 11/30/2017   Osteoarthritis of wrist 11/30/2017   Pernicious anemia 05/22/2016   Persistent atrial fibrillation (HCC) 12/20/2015   Carpal tunnel syndrome 11/16/2014   DJD (degenerative joint disease) of knee 10/27/2014   Bilateral carpal tunnel syndrome 10/27/2014   Morbid obesity with BMI of 50.0-59.9, adult (HCC) 10/27/2014   Mixed hyperlipidemia 10/27/2014   H/O gastric bypass 03/20/2014   Hyperkalemia 03/20/2014   History of prostate cancer 12/26/2013   Essential hypertension 12/26/2013   Encounter for anticoagulation discussion and counseling 12/26/2013   PCP: Smitty Cords, DO  REFERRING PROVIDER: Signa Kell, MD  REFERRING DIAGNOSIS: Incomplete tear of left rotator cuff, unspecified whether traumatic M75.112   THERAPY DIAG:  Chronic left shoulder pain  Chronic right shoulder pain  Muscle weakness (generalized)  RATIONALE FOR EVALUATION AND TREATMENT: Rehabilitation  ONSET DATE: Surgery 12/27/21, Chronic L shoulder pain for multiple years  FOLLOW UP APPT WITH PROVIDER: Yes    FROM INITIAL EVALUATION SUBJECTIVE:                                                                                                                                                                                         Chief Complaint: S/p L shoulder RTC repair, biceps tenodesis, and shoulder debridement  Pertinent History Pt with history of chronic bilateral shoulder pain. He underwent right reverse TSR and right biceps tenodesis on 09/27/21. He experienced post-operative instability in R  shoulder however it improved and he was able to avoid a revision. On 12/27/21 he underwent left arthroscopic rotator cuff repair (subscapularis (full-thickness), supraspinatus (partial-thickness) side-to-side repair + Regeneten patch), left arthroscopic biceps tenodesis, and left extensive debridement of shoulder (glenohumeral and subacromial spaces). Per surgical note other intraoperative findings include Type 2 SLAP tear, degenerative anterior labral tearing, grade 2 degenerative changes to articular cartilage of humeral head, and significant posterior capsule synovitis. No significant post-operative complications. He arrives without abduction pillow and states that Dr. Allena Katz advised him he doesn't have to wear the abduction pillow. He got behind on his pain medication today and states that today his shoulder has been the most painful he has experienced since surgery. He also noticed swelling in L lateral upper arm this morning. He did take his pain medication at 14:00, before PT evaluation.  He has a history of chronic back pain and underwent a lumbar facet, medial branch radiofrequency ablation on 01/02/22. At the time of the evaluation pt has not yet appreciated improvement in his back pain from this procedure. He has a past medical history of Cardiomyopathy (in setting of Afib), DJD (degenerative joint disease) of knee, History of kidney stones, Intervertebral disc disorder with radiculopathy of lumbosacral region, Kidney stone (2012), Lower extremity edema, PAF (paroxysmal atrial fibrillation) (HCC), Pernicious anemia, Prostate cancer (HCC), Pulmonary nodule, right, and SVT (supraventricular tachycardia) (HCC). He also  has a past surgical history that includes Knee surgery; Gastric bypass (2002); prostate seeding; Carpal tunnel release (Right, 12/23/2014); Carpal tunnel release (Left, 01/06/2015); Colonoscopy with propofol (N/A, 10/08/2017); and Cardioversion (N/A, 05/15/2019).   Pain:  Pain Intensity:  Present: 5/10, Best: 3/10, Worst: 10/10 Pain location: Anterior L shoulder Pain Quality: aching and throbbing Radiating: No  Numbness/Tingling: No Focal Weakness: Yes History of prior shoulder or neck/shoulder injury, pain, surgery, or therapy: Yes, chronic bilateral shoulder pain with R TSR. 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 No AROM or WB through LUE (per protocol), Passive elevation and ER in staged ROM to begin at week 4 (elevation 90 degrees weeks 4-6 with no ER; 0-120 weeks 6-8 with ER 0-30 degrees). No pulleys or cane until after 6 weeks, recommend no driving x 6-8 weeks.   Weight Bearing Restrictions: Yes No WB through LUE for at least 6 weeks  Living Environment Lives with: lives with their spouse Lives in: House/apartment  Patient Goals: Improve L shoulder strength and pain. Pt would like to be able to play with/lift his grandson as well as take care of his property.  OBJECTIVE:   Patient Surveys  FOTO: To be completed QuickDASH: 47.7%  Cognition Patient is oriented to person, place, and time.  Recent memory is intact.  Remote memory is intact.  Attention span and concentration are intact.  Expressive speech is intact.  Patient's fund of knowledge is within normal limits for educational level.    Gross Musculoskeletal Assessment Tremor: None Bulk: Mild swelling noted in L lateral upper arm. Pt denies chills, fevers, redness, or dischrage from inicisions Tone:  Normal  Posture Forward head and rounded shoulders  AROM Full L wrist AROM flexion, extension, radial deviation, and ulnar deviation. MCP flexion and extension WNL. Unable to assess L shoulder and elbow AROM per protocol  PROM Full L elbow PROM flexion and extension however during first attempt at slow passive extension pt reports sudden pain in L shoulder which eventually improve after a couple minutes of rest. Full L elbow PROM pronation/supination.   LE MMT: Full L wrist strength for flexion, extension, radial deviation, and ulnar deviation. L grip strength is intact and strong. Deferred testing of L elbow and shoulder strength per protocol  Sensation Grossly intact to light touch throughout LUE as determined by testing dermatomes C2-T2. Proprioception and hot/cold testing deferred on this date.  Palpation Pt is generally tender to light palpation around anterior, lateral, and posterior L shoulder;    TODAY'S TREATMENT    SUBJECTIVE: Pt reports his R shoulder continues doing well. His left shoulder has been OK over the last few days and he has actually been able to sleep without too much difficulty. He received a call from orthopedic surgeon and has a follow-up scheduled to discuss the results of his MRI. Pt reports that if presented with the option of surgery vs conservative management he would like to avoid surgery.   PAIN: L shoulder pain   Ther-ex  All exercises performed sitting fully upright today unless otherwise specified: L shoulder AROM flexion with straight elbow from neutral to around 130 degrees with no weight 3 x 10; L shoulder AROM scaption with straight elbow from neutral to around 110 degrees with no weight, 3 x 10; L shoulder IR from vertical to stomach with light manual resistance 3 x 10, no pain reported; L shoulder ER from stomach to vertical with light manual resistance 3 x 10, no pain reported;  L elbow manually resisted flexion 3 x 10; L elbow manually  resisted extension 3 x 10; Seated chest press with cane 3 x 10;  Seated rows with cane 3 x 10;   PATIENT EDUCATION:  Education details:  Pt educated throughout session about proper posture and technique with exercises. Improved exercise technique, movement at target joints, use of target muscles after min to mod verbal, visual, tactile cues. Need for orthopedic follow-up. Plan to continue strengthening until plan of care is defined. Person educated: Patient Education method: Explanation, verbal cues Education comprehension: verbalized understanding and returned demonstration   HOME EXERCISE PROGRAM: Access Code: [email or web address] URL: [email or web address] Date: 03/28/2022 Prepared by: Ria Comment  Exercises - Wrist Flexion AROM  - 1 x daily - 7 x weekly - 2 sets - 10 reps - 3s hold - Wrist Extension AROM  - 1 x daily - 7 x weekly - 2 sets - 10 reps - 3s hold - Seated Scapular Retraction  - 1 x daily - 7 x weekly - 2 sets - 10 reps - 3s hold - Standing Elbow Flexion Extension AROM  - 1 x daily - 7 x weekly - 2 sets - 10 reps - 3s hold - Standing Shoulder Flexion to 90 Degrees  - 1 x daily - 7 x weekly - 2 sets - 10 reps - 3s hold - Shoulder Abduction - Thumbs Up  - 1 x daily - 7 x weekly - 2 sets - 10 reps - 3s hold   ASSESSMENT:  CLINICAL IMPRESSION: Focused on LUE strengthening today. No excessive increase in pain during session today. Maintained minimal resistance in order to prevent inflammation or edema in L shoulder. Plan to progress LUE as pt is able to tolerate without significant increase in pain. Pt will follow-up with orthopedist to clarify his plan moving forward. Pt encouraged to continue HEP and follow-up as scheduled. He will benefit from skilled PT to address deficits in range of motion and strength in order to improve overall function at home and with leisure activities such as caring for his property.  REHAB POTENTIAL: Good  CLINICAL DECISION MAKING:  Evolving/moderate complexity  EVALUATION COMPLEXITY: Moderate   GOALS: Goals reviewed with patient? Yes  SHORT TERM GOALS: Target date: 05/09/2022   Pt will be independent with HEP to improve strength and decrease right shoulder pain to improve pain-free function at home and work. Baseline:  Goal status: ACHIEVED   LONG TERM GOALS: Target date: 09/12/2022   Pt will increase L shoulder FOTO to at least 51 to demonstrate significant improvement in function at home and with leisure activities related to L shoulder. Baseline: 01/03/22: To be completed; 01/24/22: 4; 02/28/22: 59; 04/11/22: 55; 06/15/22: 51 Goal status: ACHIEVED  2.  Pt will decrease worst L shoulder pain by at least 3 points on the NPRS in order to demonstrate clinically significant reduction in R shoulder pain. Baseline: 01/03/22: worst: 10/10; 02/28/22: 10/10; 04/11/22: 4/10; 06/15/22: 9/10; Goal status: ONGOING  3.  Pt will decrease quick DASH score by at least 8% in order to demonstrate clinically significant reduction in disability related to L shoulder pain        Baseline: 01/03/22: 47.7%; 02/28/22: 65.91%; 04/11/22: 56.8%; 06/15/22: 56.8% Goal status: ONGOING  4. Pt will demonstrate L shoulder flexion, abduction, IR, and ER strength of at least 4/5 in order to demonstrate improvement in strength and function       Baseline: 01/03/22: Unable to test per protocol; 02/28/22: At least 3/5 but unable to fully test due to protocol; 04/11/22: flexion: 4/5, abduction: 4-/5 (IR and ER at least 3/5 but not tested); 06/15/22: flexion: 4/5 (painful), abduction:  4-/5 (painful), IR (seated): 5/5, ER (seated): 4+/5 (painful); Goal status: PARTIALLY MET  PLAN: PT FREQUENCY: 1-2x/week  PT DURATION: 12 weeks  PLANNED INTERVENTIONS: Therapeutic exercises, Therapeutic activity, Neuromuscular re-education, Balance training, Gait training, Patient/Family education, Joint manipulation, Joint mobilization, Vestibular training, Canalith  repositioning, Aquatic Therapy, Dry Needling, Electrical stimulation, Spinal manipulation, Spinal mobilization, Cryotherapy, Moist heat, Traction, Ultrasound, Ionotophoresis 4mg /ml Dexamethasone, and Manual therapy  PLAN FOR NEXT SESSION: Progress L shoulder ROM and strength   Barbara Cower D Inaara Tye PT, DPT, GCS  Physical Therapist- [place]  [facility]

## 2022-08-03 NOTE — Telephone Encounter (Signed)
Called for post procedure check. Denies any complications.

## 2022-08-04 DIAGNOSIS — H2511 Age-related nuclear cataract, right eye: Secondary | ICD-10-CM | POA: Diagnosis not present

## 2022-08-04 DIAGNOSIS — H04123 Dry eye syndrome of bilateral lacrimal glands: Secondary | ICD-10-CM | POA: Diagnosis not present

## 2022-08-04 DIAGNOSIS — G510 Bell's palsy: Secondary | ICD-10-CM | POA: Diagnosis not present

## 2022-08-04 DIAGNOSIS — H2512 Age-related nuclear cataract, left eye: Secondary | ICD-10-CM | POA: Diagnosis not present

## 2022-08-07 ENCOUNTER — Ambulatory Visit: Payer: Medicare Other

## 2022-08-07 DIAGNOSIS — M75122 Complete rotator cuff tear or rupture of left shoulder, not specified as traumatic: Secondary | ICD-10-CM | POA: Diagnosis not present

## 2022-08-07 NOTE — Therapy (Signed)
OUTPATIENT PHYSICAL THERAPY SHOULDER TREATMENT  Patient Name: [name] MRN: [id number] DOB:1955-08-25, 67 y.o., male Today's Date: 08/08/2022   PT End of Session - 08/08/22 1400     Visit Number 38    Number of Visits 73    Date for PT Re-Evaluation 09/12/22    Authorization Type [employer] Medicare    Authorization Time Period UHC medicare 2024  [place]    PT Start Time 1403    PT Stop Time 1433    PT Time Calculation (min) 30 min    Activity Tolerance Patient tolerated treatment well    Behavior During Therapy WFL for tasks assessed/performed            Past Medical History:  Diagnosis Date   Anxiety    Aortic atherosclerosis (HCC)    Cardiomyopathy (in setting of Afib)    a.) TTE 12/26/2013: EF 45-50%, mild ant and antsept HK. mild MR. Mod dil LA. nl RV fxn. Rhythm was Afib; b.) TTE 07/04/2019: EF 55%, mid-apical anteroseptal HK, mild MAC, mild Ao sclerosis, G2DD, RVSP 45.3   Chronic pain syndrome    a.) followed by pain management   Chronic, continuous use of opioids    a.) hydrocodone/APAP 7.5/325 mg; followed by pain management   Coronary artery disease 11/06/2014   a.) cCTA 11/06/2014: Ca score 224 (all in pLAD) -- 74th percentile for age/sex matched control   Diverticulosis    DJD (degenerative joint disease) of knee    History of hiatal hernia    History of kidney stones 2012   Hyperlipidemia    Hyperplastic colon polyp    Hypertension    Inguinal hernia    left   Internal hemorrhoids    Intervertebral disc disorder with radiculopathy of lumbosacral region    Long term current use FULL DOSE (325 mg) aspirin    Long term current use of amiodarone    Long term current use of antithrombotics/antiplatelets    a.) rivaroxaban   Lower extremity edema    Mixed hyperlipidemia    Myalgia due to statin    Osteoarthritis    PAF (paroxysmal atrial fibrillation) (HCC)    a.) CHA2DS2VASc = 4 (age, HTN, CHF, vascular disease history);  b.) s/p DCCV 03/13/2014  (200 J x1); c.) s/p DCCV 05/15/2019 (150 J x 1, 200 J x2); d.) rate/rhythm maintained on oral amiodarone + metoprolol succinate; chronically anticoagulated with rivaroxaban   Pernicious anemia    Prostate cancer (HCC)    Pulmonary nodule, right    a. 10/2014 Cardiac CTA: 7mm RLL nodule; b. 04/2015 CT Chest: stable 7mm RLL nodule. No new nodules; 02/2017 CTA Chest: stable, benign, 7mm RLL pulm nodule.   Sleep difficulties    a.) takes melatonin + trazodone PRN   SVT (supraventricular tachycardia)    Tubular adenoma of colon    Past Surgical History:  Procedure Laterality Date   BICEPT TENODESIS Right 09/27/2021   Procedure: Right reverse shoulder arthroplasty, biceps tenodesis;  Surgeon: Signa Kell, MD;  Location: [facility];  Service: Orthopedics;  Laterality: Right;   CARDIOVERSION N/A 05/15/2019   Procedure: CARDIOVERSION;  Surgeon: Antonieta Iba, MD;  Location: [facility];  Service: Cardiovascular;  Laterality: N/A;   CARDIOVERSION N/A 03/13/2014   Procedure: CARDIOVERSION; Location: [facility]; Surgeon: [name], MD   CARDIOVERSION N/A 03/24/2022   Procedure: CARDIOVERSION;  Surgeon: Antonieta Iba, MD;  Location: [facility];  Service: Cardiovascular;  Laterality: N/A;   CARDIOVERSION N/A 05/19/2022   Procedure:  CARDIOVERSION;  Surgeon: Antonieta Iba, MD;  Location: [facility];  Service: Cardiovascular;  Laterality: N/A;   CARDIOVERSION N/A 07/25/2022   Procedure: CARDIOVERSION;  Surgeon: Duke Salvia, MD;  Location: [facility];  Service: Cardiovascular;  Laterality: N/A;   CARPAL TUNNEL RELEASE Right 12/23/2014   Procedure: CARPAL TUNNEL RELEASE;  Surgeon: Deeann Saint, MD;  Location: [facility];  Service: Orthopedics;  Laterality: Right;   CARPAL TUNNEL RELEASE Left 01/06/2015   Procedure: CARPAL TUNNEL RELEASE;  Surgeon: Deeann Saint, MD;  Location: [facility];  Service: Orthopedics;  Laterality: Left;   COLONOSCOPY WITH PROPOFOL N/A 10/08/2017   Procedure: COLONOSCOPY WITH PROPOFOL;   Surgeon: Toney Reil, MD;  Location[facility];  Service: Gastroenterology;  Laterality: N/A;   COLONOSCOPY WITH PROPOFOL N/A 06/07/2022   Procedure: COLONOSCOPY WITH PROPOFOL;  Surgeon: Toney Reil, MD;  Location: [facility] ENDOSCOPY;  Service: Gastroenterology;  Laterality: N/A;   ENTEROSCOPY  06/07/2022   Procedure: ENTEROSCOPY;  Surgeon: Toney Reil, MD;  Location: [facility] ENDOSCOPY;  Service: Gastroenterology;;   ESOPHAGOGASTRODUODENOSCOPY (EGD) WITH PROPOFOL N/A 06/07/2022   Procedure: ESOPHAGOGASTRODUODENOSCOPY (EGD) WITH PROPOFOL;  Surgeon: Toney Reil, MD;  Location: [facility] ENDOSCOPY;  Service: Gastroenterology;  Laterality: N/A;   GASTRIC BYPASS  01/17/2000   KNEE SURGERY Right    knee trauma x3   prostate seeding     REVERSE SHOULDER ARTHROPLASTY Right 09/27/2021   Procedure: Right reverse shoulder arthroplasty, biceps tenodesis;  Surgeon: Signa Kell, MD;  Location: [facility];  Service: Orthopedics;  Laterality: Right;   SHOULDER ARTHROSCOPY WITH ROTATOR CUFF REPAIR AND OPEN BICEPS TENODESIS Left 12/27/2021   Procedure: Left shoulder arthroscopic cuff repair (supraspinatus and subscapularis) with Regeneten Patch application;  Surgeon: Signa Kell, MD;  Location: [facility];  Service: Orthopedics;  Laterality: Left;   Patient Active Problem List   Diagnosis Date Noted   Symptomatic anemia 04/26/2022   Chronic pain of both knees 03/30/2022   Bilateral primary osteoarthritis of knee 03/30/2022   Shortness of breath 03/24/2022   Vitamin D deficiency 02/24/2022   S/p reverse total shoulder arthroplasty 09/27/2021   Lesion of bone of lumbosacral spine (L5) 05/12/2021   Localized osteoarthritis of shoulder regions, bilateral 03/29/2021   Chronic pain of both shoulders 03/29/2021   Drug-induced myopathy 02/21/2021   Trigger finger of right hand 11/15/2020   Prostate cancer (HCC) 07/26/2020   Insomnia 08/01/2019   Primary osteoarthritis of both wrists  02/17/2019   Trigger middle finger of left hand 02/17/2019   Chronic pain syndrome 02/17/2019   Chronic radicular lumbar pain 10/08/2018   Lumbar radiculopathy 10/08/2018   Lumbar degenerative disc disease 10/08/2018   Lumbar facet arthropathy 10/08/2018   Lumbar facet joint syndrome 10/08/2018   Intervertebral disc disorder with radiculopathy of lumbosacral region    Osteoarthritis of knee 11/30/2017   Osteoarthritis of wrist 11/30/2017   Pernicious anemia 05/22/2016   Persistent atrial fibrillation (HCC) 12/20/2015   Carpal tunnel syndrome 11/16/2014   DJD (degenerative joint disease) of knee 10/27/2014   Bilateral carpal tunnel syndrome 10/27/2014   Morbid obesity with BMI of 50.0-59.9, adult (HCC) 10/27/2014   Mixed hyperlipidemia 10/27/2014   H/O gastric bypass 03/20/2014   Hyperkalemia 03/20/2014   History of prostate cancer 12/26/2013   Essential hypertension 12/26/2013   Encounter for anticoagulation discussion and counseling 12/26/2013   PCP: Smitty Cords, DO  REFERRING PROVIDER: Signa Kell, MD  REFERRING DIAGNOSIS: Incomplete tear of left rotator cuff, unspecified whether traumatic M75.112   THERAPY DIAG:  Chronic left shoulder pain  Chronic right shoulder pain  Muscle weakness (generalized)  RATIONALE FOR EVALUATION AND TREATMENT: Rehabilitation  ONSET DATE: Surgery 12/27/21, Chronic L shoulder pain for multiple years  FOLLOW UP APPT WITH PROVIDER: Yes    FROM INITIAL EVALUATION SUBJECTIVE:                                                                                                                                                                                         Chief Complaint: S/p L shoulder RTC repair, biceps tenodesis, and shoulder debridement  Pertinent History Pt with history of chronic bilateral shoulder pain. He underwent right reverse TSR and right biceps tenodesis on 09/27/21. He experienced post-operative instability in R  shoulder however it improved and he was able to avoid a revision. On 12/27/21 he underwent left arthroscopic rotator cuff repair (subscapularis (full-thickness), supraspinatus (partial-thickness) side-to-side repair + Regeneten patch), left arthroscopic biceps tenodesis, and left extensive debridement of shoulder (glenohumeral and subacromial spaces). Per surgical note other intraoperative findings include Type 2 SLAP tear, degenerative anterior labral tearing, grade 2 degenerative changes to articular cartilage of humeral head, and significant posterior capsule synovitis. No significant post-operative complications. He arrives without abduction pillow and states that Dr. Allena Katz advised him he doesn't have to wear the abduction pillow. He got behind on his pain medication today and states that today his shoulder has been the most painful he has experienced since surgery. He also noticed swelling in L lateral upper arm this morning. He did take his pain medication at 14:00, before PT evaluation.  He has a history of chronic back pain and underwent a lumbar facet, medial branch radiofrequency ablation on 01/02/22. At the time of the evaluation pt has not yet appreciated improvement in his back pain from this procedure. He has a past medical history of Cardiomyopathy (in setting of Afib), DJD (degenerative joint disease) of knee, History of kidney stones, Intervertebral disc disorder with radiculopathy of lumbosacral region, Kidney stone (2012), Lower extremity edema, PAF (paroxysmal atrial fibrillation) (HCC), Pernicious anemia, Prostate cancer (HCC), Pulmonary nodule, right, and SVT (supraventricular tachycardia) (HCC). He also  has a past surgical history that includes Knee surgery; Gastric bypass (2002); prostate seeding; Carpal tunnel release (Right, 12/23/2014); Carpal tunnel release (Left, 01/06/2015); Colonoscopy with propofol (N/A, 10/08/2017); and Cardioversion (N/A, 05/15/2019).   Pain:  Pain Intensity:  Present: 5/10, Best: 3/10, Worst: 10/10 Pain location: Anterior L shoulder Pain Quality: aching and throbbing Radiating: No  Numbness/Tingling: No Focal Weakness: Yes History of prior shoulder or neck/shoulder injury, pain, surgery, or therapy: Yes, chronic bilateral shoulder pain with R TSR. 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 No AROM or WB through LUE (per protocol), Passive elevation and ER in staged ROM to begin at week 4 (elevation 90 degrees weeks 4-6 with no ER; 0-120 weeks 6-8 with ER 0-30 degrees). No pulleys or cane until after 6 weeks, recommend no driving x 6-8 weeks.   Weight Bearing Restrictions: Yes No WB through LUE for at least 6 weeks  Living Environment Lives with: lives with their spouse Lives in: House/apartment  Patient Goals: Improve L shoulder strength and pain. Pt would like to be able to play with/lift his grandson as well as take care of his property.  OBJECTIVE:   Patient Surveys  FOTO: To be completed QuickDASH: 47.7%  Cognition Patient is oriented to person, place, and time.  Recent memory is intact.  Remote memory is intact.  Attention span and concentration are intact.  Expressive speech is intact.  Patient's fund of knowledge is within normal limits for educational level.    Gross Musculoskeletal Assessment Tremor: None Bulk: Mild swelling noted in L lateral upper arm. Pt denies chills, fevers, redness, or dischrage from inicisions Tone:  Normal  Posture Forward head and rounded shoulders  AROM Full L wrist AROM flexion, extension, radial deviation, and ulnar deviation. MCP flexion and extension WNL. Unable to assess L shoulder and elbow AROM per protocol  PROM Full L elbow PROM flexion and extension however during first attempt at slow passive extension pt reports sudden pain in L shoulder which eventually improve after a couple minutes of rest. Full L elbow PROM pronation/supination.   LE MMT: Full L wrist strength for flexion, extension, radial deviation, and ulnar deviation. L grip strength is intact and strong. Deferred testing of L elbow and shoulder strength per protocol  Sensation Grossly intact to light touch throughout LUE as determined by testing dermatomes C2-T2. Proprioception and hot/cold testing deferred on this date.  Palpation Pt is generally tender to light palpation around anterior, lateral, and posterior L shoulder;    TODAY'S TREATMENT    SUBJECTIVE: Pt saw Dr. Allena Katz and the plan is for him to continue with conservative management. Pt does not want to pursue surgery at this time. He got a steroid injection in his left shoulder and he has noticed a slight improvement in pain. No specific questions currently.   PAIN: L shoulder pain   Ther-ex  All exercises performed sitting fully upright today unless otherwise specified: L shoulder AROM flexion with straight elbow from neutral to around 130 degrees with no weight 3 x 10; L shoulder AROM scaption with straight elbow from neutral to around 110 degrees with no weight, 3 x 10; L shoulder overhead press 3 x 10; L shoulder IR from vertical to stomach with light manual resistance 3 x 10, no pain reported; L shoulder ER from stomach to vertical with light manual resistance 3 x 10, no pain reported;  L elbow manually resisted flexion 3 x 10; L elbow manually resisted extension 3 x 10; L forearm manually resisted pronation/supination 3 x 10  each; Seated chest press with cane 3 x 10; Seated rows with cane 3 x  10;   PATIENT EDUCATION:  Education details:  Pt educated throughout session about proper posture and technique with exercises. Improved exercise technique, movement at target joints, use of target muscles after min to mod verbal, visual, tactile cues. Need for orthopedic follow-up.  Person educated: Patient Education method: Explanation, verbal cues Education comprehension: verbalized understanding and returned demonstration   HOME EXERCISE PROGRAM: Access Code: [email or web address] URL: [email or web address] Date: 03/28/2022 Prepared by: Ria Comment  Exercises - Wrist Flexion AROM  - 1 x daily - 7 x weekly - 2 sets - 10 reps - 3s hold - Wrist Extension AROM  - 1 x daily - 7 x weekly - 2 sets - 10 reps - 3s hold - Seated Scapular Retraction  - 1 x daily - 7 x weekly - 2 sets - 10 reps - 3s hold - Standing Elbow Flexion Extension AROM  - 1 x daily - 7 x weekly - 2 sets - 10 reps - 3s hold - Standing Shoulder Flexion to 90 Degrees  - 1 x daily - 7 x weekly - 2 sets - 10 reps - 3s hold - Shoulder Abduction - Thumbs Up  - 1 x daily - 7 x weekly - 2 sets - 10 reps - 3s hold   ASSESSMENT:  CLINICAL IMPRESSION: Continued to focus on LUE strengthening today. No excessive increase in pain during session today. Maintained minimal resistance in order to prevent inflammation or edema in L shoulder especially given his recent steroid injection yesterday. Plan to progress LUE as pt is able to tolerate without significant increase in pain. Will consider progressing to additional upright rows in future sessions. Pt encouraged to continue HEP and follow-up as scheduled. He will benefit from skilled PT to address deficits in range of motion and strength in order to improve overall function at home and with leisure activities such as caring for his property.  REHAB POTENTIAL: Good  CLINICAL DECISION MAKING: Evolving/moderate  complexity  EVALUATION COMPLEXITY: Moderate   GOALS: Goals reviewed with patient? Yes  SHORT TERM GOALS: Target date: 05/09/2022   Pt will be independent with HEP to improve strength and decrease right shoulder pain to improve pain-free function at home and work. Baseline:  Goal status: ACHIEVED   LONG TERM GOALS: Target date: 09/12/2022   Pt will increase L shoulder FOTO to at least 51 to demonstrate significant improvement in function at home and with leisure activities related to L shoulder. Baseline: 01/03/22: To be completed; 01/24/22: 4; 02/28/22: 59; 04/11/22: 55; 06/15/22: 51 Goal status: ACHIEVED  2.  Pt will decrease worst L shoulder pain by at least 3 points on the NPRS in order to demonstrate clinically significant reduction in R shoulder pain. Baseline: 01/03/22: worst: 10/10; 02/28/22: 10/10; 04/11/22: 4/10; 06/15/22: 9/10; Goal status: ONGOING  3.  Pt will decrease quick DASH score by at least 8% in order to demonstrate clinically significant reduction in disability related to L shoulder pain        Baseline: 01/03/22: 47.7%; 02/28/22: 65.91%; 04/11/22: 56.8%; 06/15/22: 56.8% Goal status: ONGOING  4. Pt will demonstrate L shoulder flexion, abduction, IR, and ER strength of at least 4/5 in order to demonstrate improvement in strength and function       Baseline: 01/03/22: Unable to test per protocol; 02/28/22: At least 3/5 but unable to fully test due to protocol; 04/11/22: flexion: 4/5, abduction: 4-/5 (IR and ER at least 3/5 but not tested); 06/15/22: flexion: 4/5 (painful), abduction: 4-/5 (painful), IR (seated): 5/5, ER (seated):  4+/5 (painful); Goal status: PARTIALLY MET  PLAN: PT FREQUENCY: 1-2x/week  PT DURATION: 12 weeks  PLANNED INTERVENTIONS: Therapeutic exercises, Therapeutic activity, Neuromuscular re-education, Balance training, Gait training, Patient/Family education, Joint manipulation, Joint mobilization, Vestibular training, Canalith repositioning, Aquatic  Therapy, Dry Needling, Electrical stimulation, Spinal manipulation, Spinal mobilization, Cryotherapy, Moist heat, Traction, Ultrasound, Ionotophoresis 4mg /ml Dexamethasone, and Manual therapy  PLAN FOR NEXT SESSION: Progress L shoulder ROM and strength   Barbara Cower D Apphia Cropley PT, DPT, GCS  Physical Therapist- [place]  [facility]

## 2022-08-08 ENCOUNTER — Ambulatory Visit: Payer: Medicare Other

## 2022-08-08 DIAGNOSIS — M6281 Muscle weakness (generalized): Secondary | ICD-10-CM | POA: Diagnosis not present

## 2022-08-08 DIAGNOSIS — G8929 Other chronic pain: Secondary | ICD-10-CM

## 2022-08-08 DIAGNOSIS — M25512 Pain in left shoulder: Secondary | ICD-10-CM | POA: Diagnosis not present

## 2022-08-10 ENCOUNTER — Ambulatory Visit: Payer: Medicare Other

## 2022-08-13 ENCOUNTER — Other Ambulatory Visit: Payer: Self-pay | Admitting: Cardiovascular Disease

## 2022-08-15 ENCOUNTER — Ambulatory Visit: Payer: Medicare Other

## 2022-08-15 ENCOUNTER — Ambulatory Visit
Payer: Medicare Other | Attending: Student in an Organized Health Care Education/Training Program | Admitting: Student in an Organized Health Care Education/Training Program

## 2022-08-15 ENCOUNTER — Encounter: Payer: Self-pay | Admitting: Student in an Organized Health Care Education/Training Program

## 2022-08-15 VITALS — BP 156/113 | HR 100 | Temp 97.2°F | Resp 17 | Ht 72.0 in | Wt 333.0 lb

## 2022-08-15 DIAGNOSIS — G8929 Other chronic pain: Secondary | ICD-10-CM | POA: Insufficient documentation

## 2022-08-15 DIAGNOSIS — G894 Chronic pain syndrome: Secondary | ICD-10-CM | POA: Insufficient documentation

## 2022-08-15 DIAGNOSIS — M19012 Primary osteoarthritis, left shoulder: Secondary | ICD-10-CM | POA: Insufficient documentation

## 2022-08-15 DIAGNOSIS — M47816 Spondylosis without myelopathy or radiculopathy, lumbar region: Secondary | ICD-10-CM | POA: Diagnosis not present

## 2022-08-15 DIAGNOSIS — M5416 Radiculopathy, lumbar region: Secondary | ICD-10-CM | POA: Insufficient documentation

## 2022-08-15 DIAGNOSIS — M19011 Primary osteoarthritis, right shoulder: Secondary | ICD-10-CM | POA: Insufficient documentation

## 2022-08-15 DIAGNOSIS — M170 Bilateral primary osteoarthritis of knee: Secondary | ICD-10-CM | POA: Insufficient documentation

## 2022-08-15 MED ORDER — HYDROCODONE-ACETAMINOPHEN 7.5-325 MG PO TABS
1.0000 | ORAL_TABLET | Freq: Four times a day (QID) | ORAL | 0 refills | Status: AC | PRN
Start: 2022-09-18 — End: 2022-10-18

## 2022-08-15 MED ORDER — HYDROCODONE-ACETAMINOPHEN 7.5-325 MG PO TABS
1.0000 | ORAL_TABLET | Freq: Four times a day (QID) | ORAL | 0 refills | Status: AC | PRN
Start: 2022-08-19 — End: 2022-09-18

## 2022-08-15 MED ORDER — HYDROCODONE-ACETAMINOPHEN 7.5-325 MG PO TABS
1.0000 | ORAL_TABLET | Freq: Four times a day (QID) | ORAL | 0 refills | Status: DC | PRN
Start: 2022-10-18 — End: 2022-11-07

## 2022-08-15 NOTE — Progress Notes (Signed)
Nursing Pain Medication Assessment:  Safety precautions to be maintained throughout the outpatient stay will include: orient to surroundings, keep bed in low position, maintain call bell within reach at all times, provide assistance with transfer out of bed and ambulation.  Medication Inspection Compliance: Mr. Aman did not comply with our request to bring his pills to be counted. He was reminded that bringing the medication bottles, even when empty, is a requirement.  Medication: None brought in. Pill/Patch Count: None available to be counted. Bottle Appearance: No container available. Did not bring bottle(s) to appointment. Filled Date: N/A Last Medication intake:  Today

## 2022-08-15 NOTE — Progress Notes (Signed)
PROVIDER NOTE: Information contained herein reflects review and annotations entered in association with encounter. Interpretation of such information and data should be left to medically-trained personnel. Information provided to patient can be located elsewhere in the medical record under "Patient Instructions". Document created using STT-dictation technology, any transcriptional errors that may result from process are unintentional.    Patient: [name]  Service Category: E/M  Provider: Edward Jolly, MD  DOB: [birth year]  DOS: 08/15/2022  Specialty: Interventional Pain Management  MRN: [id number]  Setting: Ambulatory outpatient  PCP: Smitty Cords, DO  Type: Established Patient    Referring Provider: Saralyn Pilar *  Location: Office  Delivery: Face-to-face     HPI  [name], a 67 y.o. year old male, is here today because of his Lumbar facet arthropathy [M47.816]. [name] primary complain today is Shoulder Pain (left), Wrist Pain (right), Back Pain (lower), and Knee Pain (bilat) Last encounter: My last encounter with him was on 08/02/22 Pertinent problems: [name] has H/O gastric bypass; DJD (degenerative joint disease) of knee; Bilateral carpal tunnel syndrome; Persistent atrial fibrillation (HCC); Chronic radicular lumbar pain; Lumbar radiculopathy; Lumbar degenerative disc disease; Lumbar facet arthropathy; Lumbar facet joint syndrome; Primary osteoarthritis of both wrists; Trigger middle finger of left hand; and Chronic pain syndrome on their pertinent problem list. Pain Assessment: Severity of Chronic pain is reported as a 4 /10. Location: Shoulder Left/denies. Onset: More than a month ago. Quality: Sharp. Timing: Constant. Modifying factor(s): meds, injections. Vitals:  height is 6' (1.829 m) and weight is 333 lb (151 kg) (abnormal). His temperature is 97.2 F (36.2 C) (abnormal). His blood pressure is 156/113 (abnormal) and his pulse is 100. His respiration  is 17 and oxygen saturation is 98%.   Reason for encounter: both, medication management and post-procedure evaluation and assessment    Post-procedure evaluation    Procedure:          Anesthesia, Analgesia, Anxiolysis:  Type: Trigger Finger Ligament/Tendon sheath (40981) Injection.           Purpose: Therapeutic Target Area: Flexor Digitorum Tendon sheath nodule Region: A-2 and A-4 pulley of the metacarpal area Approach: Percutaneous Digit: No:3(Middle) Finger Laterality: Right-hand  Type: Local Anesthesia Local Anesthetic: Lidocaine 1-2% Sedation: None  Indication(s):  Analgesia Route: Infiltration (Rawlings/IM) IV Access: N/A   Position: Sitting   1. Trigger middle finger of right hand     NAS-11 Pain score:   Pre-procedure: 5 /10   Post-procedure: 0-No pain/10      Effectiveness:  Initial hour after procedure: 100 %  Subsequent 4-6 hours post-procedure: 100 %  Analgesia past initial 6 hours: 75 %  Ongoing improvement:  Analgesic:  75% Function: Back to baseline ROM: [name] reports improvement in ROM    Pharmacotherapy Assessment  Analgesic:  hydrocodone 7.5 mg 3 times daily as needed     Monitoring: [place] PMP: PDMP reviewed during this encounter.       Pharmacotherapy: No side-effects or adverse reactions reported. Compliance: No problems identified. Effectiveness: Clinically acceptable.  Nonah Mattes, RN  08/15/2022  9:55 AM  Sign when Signing Visit Nursing Pain Medication Assessment:  Safety precautions to be maintained throughout the outpatient stay will include: orient to surroundings, keep bed in low position, maintain call bell within reach at all times, provide assistance with transfer out of bed and ambulation.  Medication Inspection Compliance: Mr. Buss did not comply with our request to bring his pills to be counted. He was reminded that  bringing the medication bottles, even when empty, is a requirement.  Medication: None brought in. Pill/Patch  Count: None available to be counted. Bottle Appearance: No container available. Did not bring bottle(s) to appointment. Filled Date: N/A Last Medication intake:  Today     UDS:  Summary  Date Value Ref Range Status  08/23/2021 Note  Final    Comment:    ==================================================================== [employer] Select 13 (MW) ==================================================================== Test                             Result       Flag       Units  Drug Present and Declared for Prescription Verification   Hydrocodone                    4372         EXPECTED   ng/mg creat   Hydromorphone                  98           EXPECTED   ng/mg creat   Dihydrocodeine                 478          EXPECTED   ng/mg creat   Norhydrocodone                 >2488        EXPECTED   ng/mg creat    Sources of hydrocodone include scheduled prescription medications.    Hydromorphone, dihydrocodeine and norhydrocodone are expected    metabolites of hydrocodone. Hydromorphone and dihydrocodeine are    also available as scheduled prescription medications.  ==================================================================== Test                      Result    Flag   Units      Ref Range   Creatinine              201              mg/dL      >=28 ==================================================================== Declared Medications:  The flagging and interpretation on this report are based on the  following declared medications.  Unexpected results may arise from  inaccuracies in the declared medications.   **Note: The testing scope of this panel includes these medications:   Hydrocodone (Norco)   **Note: The testing scope of this panel does not include the  following reported medications:   Acetaminophen (Norco)  Amiodarone (Pacerone)  Amlodipine (Norvasc)  Aspirin  Benazepril (Lotensin)  Buspirone (Buspar)  Cyclobenzaprine (Flexeril)  Duloxetine (Cymbalta)  Eye  Drops  Furosemide (Lasix)  Melatonin  Metoprolol (Toprol)  Multivitamin  Rivaroxaban (Xarelto)  Supplement  Tamsulosin (Flomax)  Trazodone (Desyrel)  Vitamin B12 ==================================================================== For clinical consultation, please call [phone]. ====================================================================      ROS  Constitutional: Denies any fever or chills Gastrointestinal: No reported hemesis, hematochezia, vomiting, or acute GI distress Musculoskeletal:  Low back pain, bilateral knee pain Neurological: No reported episodes of acute onset apraxia, aphasia, dysarthria, agnosia, amnesia, paralysis, loss of coordination, or loss of consciousness  Medication Review  Ascorbic Acid, DULoxetine, HYDROcodone-acetaminophen, amiodarone, benazepril, busPIRone, carboxymethylcellulose, cyanocobalamin, cyclobenzaprine, ferrous sulfate, fluticasone, melatonin, metoprolol succinate, multivitamin with minerals, rivaroxaban, and traZODone  History Review  Allergy: Mr. Degon is allergic to bupivacaine liposome. Drug: Mr. Pecore  reports no history of drug  use. Alcohol:  reports that he does not currently use alcohol. Tobacco:  reports that he quit smoking about 43 years ago. His smoking use included cigarettes. He started smoking about 53 years ago. He has a 10 pack-year smoking history. He has quit using smokeless tobacco.  His smokeless tobacco use included snuff. Social: Mr. Deppe  reports that he quit smoking about 43 years ago. His smoking use included cigarettes. He started smoking about 53 years ago. He has a 10 pack-year smoking history. He has quit using smokeless tobacco.  His smokeless tobacco use included snuff. He reports that he does not currently use alcohol. He reports that he does not use drugs. Medical:  has a past medical history of Anxiety, Aortic atherosclerosis (HCC), Cardiomyopathy (in setting of Afib), Chronic pain syndrome,  Chronic, continuous use of opioids, Coronary artery disease (11/06/2014), Diverticulosis, DJD (degenerative joint disease) of knee, History of hiatal hernia, History of kidney stones (2012), Hyperlipidemia, Hyperplastic colon polyp, Hypertension, Inguinal hernia, Internal hemorrhoids, Intervertebral disc disorder with radiculopathy of lumbosacral region, Long term current use FULL DOSE (325 mg) aspirin, Long term current use of amiodarone, Long term current use of antithrombotics/antiplatelets, Lower extremity edema, Mixed hyperlipidemia, Myalgia due to statin, Osteoarthritis, PAF (paroxysmal atrial fibrillation) (HCC), Pernicious anemia, Prostate cancer (HCC), Pulmonary nodule, right, Sleep difficulties, SVT (supraventricular tachycardia), and Tubular adenoma of colon. Surgical: Mr. Tiffin  has a past surgical history that includes Knee surgery (Right); Gastric bypass (01/17/2000); prostate seeding; Carpal tunnel release (Right, 12/23/2014); Carpal tunnel release (Left, 01/06/2015); Colonoscopy with propofol (N/A, 10/08/2017); Cardioversion (N/A, 05/15/2019); Cardioversion (N/A, 03/13/2014); Reverse shoulder arthroplasty (Right, 09/27/2021); Bicept tenodesis (Right, 09/27/2021); Shoulder arthroscopy with rotator cuff repair and open biceps tenodesis (Left, 12/27/2021); Cardioversion (N/A, 03/24/2022); Cardioversion (N/A, 05/19/2022); Colonoscopy with propofol (N/A, 06/07/2022); Esophagogastroduodenoscopy (egd) with propofol (N/A, 06/07/2022); enteroscopy (06/07/2022); and Cardioversion (N/A, 07/25/2022). Family: family history includes Arrhythmia in his brother and sister; Cancer in his brother, father, and sister; Heart disease in his father; Hypertension in his father; Stroke in his father.  Laboratory Chemistry Profile   Renal Lab Results  Component Value Date   BUN 17 07/04/2022   CREATININE 1.07 07/04/2022   BCR NOT APPLICABLE 07/27/2021   GFRAA 72 07/21/2020   GFRNONAA >60 07/04/2022    Hepatic Lab  Results  Component Value Date   AST 19 04/26/2022   ALT 13 04/26/2022   ALBUMIN 4.1 04/26/2022   ALKPHOS 68 04/26/2022   LIPASE 28 03/13/2017    Electrolytes Lab Results  Component Value Date   NA 140 07/04/2022   K 4.3 07/04/2022   CL 107 07/04/2022   CALCIUM 8.5 (L) 07/04/2022   MG 2.0 02/01/2021    Bone Lab Results  Component Value Date   25OHVITD1 32 02/01/2021   25OHVITD2 <1.0 02/01/2021   25OHVITD3 32 02/01/2021    Inflammation (CRP: Acute Phase) (ESR: Chronic Phase) Lab Results  Component Value Date   CRP 1.7 02/09/2022   ESRSEDRATE 6 02/09/2022         Note: Above Lab results reviewed.    Physical Exam  General appearance: Well nourished, well developed, and well hydrated. In no apparent acute distress Mental status: Alert, oriented x 3 (person, place, & time)       Respiratory: No evidence of acute respiratory distress Eyes: PERLA Vitals: BP (!) 156/113   Pulse 100   Temp (!) 97.2 F (36.2 C)   Resp 17   Ht 6' (1.829 m)   Wt (!) 333 lb (151  kg)   SpO2 98%   BMI 45.16 kg/m  BMI: Estimated body mass index is 45.16 kg/m as calculated from the following:   Height as of this encounter: 6' (1.829 m).   Weight as of this encounter: 333 lb (151 kg). Ideal: Ideal body weight: 77.6 kg (171 lb 1.2 oz) Adjusted ideal body weight: 107 kg (235 lb 13.5 oz)  Right wrist pain worse with wrist extension  Lumbar Spine Area Exam  Skin & Axial Inspection: No masses, redness, or swelling Alignment: Symmetrical Functional ROM: Pain restricted ROM       Stability: No instability detected Muscle Tone/Strength: Functionally intact. No obvious neuro-muscular anomalies detected. Sensory (Neurological): Musculoskeletal pain pattern, related to lumbar facets, positive pain with lumbar extension and facet loading  Gait & Posture Assessment  Ambulation: Unassisted Gait: Relatively normal for age and body habitus Posture: WNL  Lower Extremity Exam      Side: Right  lower extremity   Side: Left lower extremity  Stability: No instability observed           Stability: No instability observed          Skin & Extremity Inspection: Skin color, temperature, and hair growth are WNL. No peripheral edema or cyanosis. No masses, redness, swelling, asymmetry, or associated skin lesions. No contractures.   Skin & Extremity Inspection: Skin color, temperature, and hair growth are WNL. No peripheral edema or cyanosis. No masses, redness, swelling, asymmetry, or associated skin lesions. No contractures.  Functional ROM: Pain restricted ROM for hip and knee joints           Functional ROM: Pain restricted ROM for hip and knee joints          Muscle Tone/Strength: Functionally intact. No obvious neuro-muscular anomalies detected.   Muscle Tone/Strength: Functionally intact. No obvious neuro-muscular anomalies detected.  Sensory (Neurological): Arthropathic        Sensory (Neurological): Arthropathic       DTR: Patellar: deferred today Achilles: deferred today Plantar: deferred today   DTR: Patellar: deferred today Achilles: deferred today Plantar: deferred today  Palpation: No palpable anomalies   Palpation: No palpable anomalies     Assessment   Status Diagnosis  Controlled Controlled Controlled 1. Lumbar facet arthropathy   2. Lumbar facet joint syndrome   3. Bilateral primary osteoarthritis of knee   4. Localized osteoarthritis of shoulder regions, bilateral   5. Chronic radicular lumbar pain   6. Lumbar radiculopathy   7. Chronic pain syndrome         Plan of Care   1. Lumbar facet arthropathy  2. Lumbar facet joint syndrome  3. Bilateral primary osteoarthritis of knee  4. Localized osteoarthritis of shoulder regions, bilateral - HYDROcodone-acetaminophen (NORCO) 7.5-325 MG tablet; Take 1 tablet by mouth every 6 (six) hours as needed for severe pain. Must last 30 days  Dispense: 120 tablet; Refill: 0 - HYDROcodone-acetaminophen (NORCO) 7.5-325 MG  tablet; Take 1 tablet by mouth every 6 (six) hours as needed for severe pain. Must last 30 days  Dispense: 120 tablet; Refill: 0 - HYDROcodone-acetaminophen (NORCO) 7.5-325 MG tablet; Take 1 tablet by mouth every 6 (six) hours as needed for severe pain. Must last 30 days  Dispense: 120 tablet; Refill: 0  5. Chronic radicular lumbar pain  6. Lumbar radiculopathy  7. Chronic pain syndrome - HYDROcodone-acetaminophen (NORCO) 7.5-325 MG tablet; Take 1 tablet by mouth every 6 (six) hours as needed for severe pain. Must last 30 days  Dispense: 120 tablet; Refill:  0 - HYDROcodone-acetaminophen (NORCO) 7.5-325 MG tablet; Take 1 tablet by mouth every 6 (six) hours as needed for severe pain. Must last 30 days  Dispense: 120 tablet; Refill: 0 - HYDROcodone-acetaminophen (NORCO) 7.5-325 MG tablet; Take 1 tablet by mouth every 6 (six) hours as needed for severe pain. Must last 30 days  Dispense: 120 tablet; Refill: 0 - [employer]ox[employer]SURE Select 13 (MW), Urine   Pharmacotherapy (Medications Ordered): Meds ordered this encounter  Medications   HYDROcodone-acetaminophen (NORCO) 7.5-325 MG tablet    Sig: Take 1 tablet by mouth every 6 (six) hours as needed for severe pain. Must last 30 days    Dispense:  120 tablet    Refill:  0    Chronic Pain: STOP Act (Not applicable) Fill 1 day early if closed on refill date. Avoid benzodiazepines within 8 hours of opioids   HYDROcodone-acetaminophen (NORCO) 7.5-325 MG tablet    Sig: Take 1 tablet by mouth every 6 (six) hours as needed for severe pain. Must last 30 days    Dispense:  120 tablet    Refill:  0    Chronic Pain: STOP Act (Not applicable) Fill 1 day early if closed on refill date. Avoid benzodiazepines within 8 hours of opioids   HYDROcodone-acetaminophen (NORCO) 7.5-325 MG tablet    Sig: Take 1 tablet by mouth every 6 (six) hours as needed for severe pain. Must last 30 days    Dispense:  120 tablet    Refill:  0    Chronic Pain: STOP Act (Not applicable) Fill 1  day early if closed on refill date. Avoid benzodiazepines within 8 hours of opioids   Orders Placed This Encounter  Procedures   [employer]ox[employer] Select 13 (MW), Urine    Volume: 30 ml(s). Minimum 3 ml of urine is needed. Document temperature of fresh sample. Indications: Long term (current) use of opiate analgesic [id number])    Order Specific Question:   Release to patient    Answer:   Immediate      Follow-up plan:   Return in about 3 months (around 11/15/2022) for Medication Management, in person.     Recent Visits Date Type Provider Dept  08/02/22 Procedure visit Edward Jolly, MD [facility]  07/11/22 Office Visit Edward Jolly, MD [facility]  06/28/22 Office Visit Edward Jolly, MD [facility]  Showing recent visits within past 90 days and meeting all other requirements Today's Visits Date Type Provider Dept  08/15/22 Office Visit Edward Jolly, MD [facility]  Showing today's visits and meeting all other requirements Future Appointments No visits were found meeting these conditions. Showing future appointments within next 90 days and meeting all other requirements  I discussed the assessment and treatment plan with the patient. The patient was provided an opportunity to ask questions and all were answered. The patient agreed with the plan and demonstrated an understanding of the instructions.  Patient advised to call back or seek an in-person evaluation if the symptoms or condition worsens.  Duration of encounter: 30 minutes.  Note by: Edward Jolly, MD Date: 08/15/2022; Time: 10:15 AM

## 2022-08-15 NOTE — Patient Instructions (Signed)

## 2022-08-17 ENCOUNTER — Ambulatory Visit: Payer: Medicare Other

## 2022-08-17 DIAGNOSIS — G8929 Other chronic pain: Secondary | ICD-10-CM

## 2022-08-17 DIAGNOSIS — M6281 Muscle weakness (generalized): Secondary | ICD-10-CM

## 2022-08-18 ENCOUNTER — Other Ambulatory Visit: Payer: Self-pay | Admitting: Cardiovascular Disease

## 2022-08-18 DIAGNOSIS — I10 Essential (primary) hypertension: Secondary | ICD-10-CM

## 2022-08-22 ENCOUNTER — Ambulatory Visit: Payer: Medicare Other

## 2022-08-31 ENCOUNTER — Telehealth: Payer: Self-pay | Admitting: *Deleted

## 2022-08-31 ENCOUNTER — Ambulatory Visit: Payer: Medicare Other | Attending: Internal Medicine | Admitting: Internal Medicine

## 2022-08-31 ENCOUNTER — Encounter: Payer: Self-pay | Admitting: Internal Medicine

## 2022-08-31 VITALS — BP 135/90 | HR 67 | Ht 72.0 in | Wt 344.8 lb

## 2022-08-31 DIAGNOSIS — I4819 Other persistent atrial fibrillation: Secondary | ICD-10-CM | POA: Diagnosis not present

## 2022-08-31 MED ORDER — RANOLAZINE ER 500 MG PO TB12: 500.0000 mg | ORAL_TABLET | Freq: Two times a day (BID) | ORAL | 3 refills | Status: DC

## 2022-08-31 NOTE — H&P (View-Only) (Signed)
 Patient Care Team: Smitty Cords, DO as PCP - General (Family Medicine) Antonieta Iba, MD as PCP - Cardiology (Cardiology) [facility] Salvia, MD as PCP - Electrophysiology (Cardiology) Earna Coder, MD as Consulting Physician (Oncology)   HPI  [name] is a 67 y.o. male seen in follow-up for atrial fibrillation of longstanding on amiodarone for many years with cardioversion infrequently.  Most recently 2024 (TG) which failed and prompted him to discontinue the amiodarone which I subsequently then resumed.  We did successfully cardioverted him 7/24   The patient denies chest pain, nocturnal dyspnea, orthopnea or peripheral edema.  There have been no palpitations, or syncope.  Complains of some orthostatic LH which has improved recently and chronic shortness of breath  .    Interval history of GI bleeding; presumed iron deficiency attributed to malabsorption in the context of gastric bypass as well as hemorrhoidal bleeding.  Treated with iron supplementation Has a hernia, and saw the gastric bypass team at [facility].  They discussed other strategies for weight loss.  No overt bleeding will continue on his anticoagulation     DATE TEST EF    12/15 Echo   45-50 %    10/16 CTA   CAScore 224  6/21 Echo   55 % LAE moderate 4.7 cm   5/24 Echo  55-60%  43 ml/m2     Date Cr K Hgb  4/21     13.1  3/23 1.13 4.4 10.7<<10.0   3/24 1.07 (4/24) 4.4(4/24) 11.4 (mcv 71)  6/24   13.3 (mcv 76)    Antiarrhythmics Date Reason stopped  Amiodarone 2016 Failure 3/24 DC by TG, resumed by SK          Records and Results Reviewed   Past Medical History:  Diagnosis Date   Anxiety    Aortic atherosclerosis (HCC)    Cardiomyopathy (in setting of Afib)    a.) TTE 12/26/2013: EF 45-50%, mild ant and antsept HK. mild MR. Mod dil LA. nl RV fxn. Rhythm was Afib; b.) TTE 07/04/2019: EF 55%, mid-apical anteroseptal HK, mild MAC, mild Ao sclerosis, G2DD, RVSP 45.3   Chronic  pain syndrome    a.) followed by pain management   Chronic, continuous use of opioids    a.) hydrocodone/APAP 7.5/325 mg; followed by pain management   Coronary artery disease 11/06/2014   a.) cCTA 11/06/2014: Ca score 224 (all in pLAD) -- 74th percentile for age/sex matched control   Diverticulosis    DJD (degenerative joint disease) of knee    History of hiatal hernia    History of kidney stones 2012   Hyperlipidemia    Hyperplastic colon polyp    Hypertension    Inguinal hernia    left   Internal hemorrhoids    Intervertebral disc disorder with radiculopathy of lumbosacral region    Long term current use FULL DOSE (325 mg) aspirin    Long term current use of amiodarone    Long term current use of antithrombotics/antiplatelets    a.) rivaroxaban   Lower extremity edema    Mixed hyperlipidemia    Myalgia due to statin    Osteoarthritis    PAF (paroxysmal atrial fibrillation) (HCC)    a.) CHA2DS2VASc = 4 (age, HTN, CHF, vascular disease history);  b.) s/p DCCV 03/13/2014 (200 J x1); c.) s/p DCCV 05/15/2019 (150 J x 1, 200 J x2); d.) rate/rhythm maintained on oral amiodarone + metoprolol succinate; chronically anticoagulated with  rivaroxaban   Pernicious anemia    Prostate cancer (HCC)    Pulmonary nodule, right    a. 10/2014 Cardiac CTA: 7mm RLL nodule; b. 04/2015 CT Chest: stable 7mm RLL nodule. No new nodules; 02/2017 CTA Chest: stable, benign, 7mm RLL pulm nodule.   Sleep difficulties    a.) takes melatonin + trazodone PRN   SVT (supraventricular tachycardia)    Tubular adenoma of colon     Past Surgical History:  Procedure Laterality Date   BICEPT TENODESIS Right 09/27/2021   Procedure: Right reverse shoulder arthroplasty, biceps tenodesis;  Surgeon: Signa Kell, MD;  Location: [facility] ORS;  Service: Orthopedics;  Laterality: Right;   CARDIOVERSION N/A 05/15/2019   Procedure: CARDIOVERSION;  Surgeon: Antonieta Iba, MD;  Location: [facility];  Service: Cardiovascular;   Laterality: N/A;   CARDIOVERSION N/A 03/13/2014   Procedure: CARDIOVERSION; Location: [facility]; Surgeon: [name], MD   CARDIOVERSION N/A 03/24/2022   Procedure: CARDIOVERSION;  Surgeon: Antonieta Iba, MD;  Location: [facility];  Service: Cardiovascular;  Laterality: N/A;   CARDIOVERSION N/A 05/19/2022   Procedure: CARDIOVERSION;  Surgeon: Antonieta Iba, MD;  Location: [facility];  Service: Cardiovascular;  Laterality: N/A;   CARDIOVERSION N/A 07/25/2022   Procedure: CARDIOVERSION;  Surgeon: [facility] Salvia, MD;  Location: [facility];  Service: Cardiovascular;  Laterality: N/A;   CARPAL TUNNEL RELEASE Right 12/23/2014   Procedure: CARPAL TUNNEL RELEASE;  Surgeon: Deeann Saint, MD;  Location: [facility];  Service: Orthopedics;  Laterality: Right;   CARPAL TUNNEL RELEASE Left 01/06/2015   Procedure: CARPAL TUNNEL RELEASE;  Surgeon: Deeann Saint, MD;  Location: [facility];  Service: Orthopedics;  Laterality: Left;   COLONOSCOPY WITH PROPOFOL N/A 10/08/2017   Procedure: COLONOSCOPY WITH PROPOFOL;  Surgeon: Toney Reil, MD;  Location: [facility] ENDOSCOPY;  Service: Gastroenterology;  Laterality: N/A;   COLONOSCOPY WITH PROPOFOL N/A 06/07/2022   Procedure: COLONOSCOPY WITH PROPOFOL;  Surgeon: Toney Reil, MD;  Location: [facility] ENDOSCOPY;  Service: Gastroenterology;  Laterality: N/A;   ENTEROSCOPY  06/07/2022   Procedure: ENTEROSCOPY;  Surgeon: Toney Reil, MD;  Location: [facility] ENDOSCOPY;  Service: Gastroenterology;;   ESOPHAGOGASTRODUODENOSCOPY (EGD) WITH PROPOFOL N/A 06/07/2022   Procedure: ESOPHAGOGASTRODUODENOSCOPY (EGD) WITH PROPOFOL;  Surgeon: Toney Reil, MD;  Location: [facility] ENDOSCOPY;  Service: Gastroenterology;  Laterality: N/A;   GASTRIC BYPASS  01/17/2000   KNEE SURGERY Right    knee trauma x3   prostate seeding     REVERSE SHOULDER ARTHROPLASTY Right 09/27/2021   Procedure: Right reverse shoulder arthroplasty, biceps tenodesis;  Surgeon: Signa Kell, MD;  Location:  [facility];  Service: Orthopedics;  Laterality: Right;   SHOULDER ARTHROSCOPY WITH ROTATOR CUFF REPAIR AND OPEN BICEPS TENODESIS Left 12/27/2021   Procedure: Left shoulder arthroscopic cuff repair (supraspinatus and subscapularis) with Regeneten Patch application;  Surgeon: Signa Kell, MD;  Location: [facility];  Service: Orthopedics;  Laterality: Left;    Current Meds  Medication Sig   amiodarone (PACERONE) 200 MG tablet TAKE ONE TABLET BY MOUTH TWICE A DAY   Ascorbic Acid (VITAMIN C PO) Take by mouth.   benazepril (LOTENSIN) 40 MG tablet TAKE ONE TABLET BY MOUTH ONE TIME DAILY   busPIRone (BUSPAR) 10 MG tablet TAKE ONE TABLET BY MOUTH FOUR TIMES A DAY AS NEEDED FOR ANXIETY   carboxymethylcellulose (REFRESH PLUS) 0.5 % SOLN 1 drop 3 (three) times daily as needed.   cyanocobalamin (VITAMIN B12) 1000 MCG/ML injection Inject 1 mL (1,000 mcg total) into the skin every 30 (thirty) days.  INJECT EVERY 30 DAYS AS DIRECTED   cyclobenzaprine (FLEXERIL) 10 MG tablet TAKE ONE TABLET BY MOUTH THREE TIMES A DAY AS NEEDED FOR MUSCLE SPASM   DULoxetine (CYMBALTA) 60 MG capsule Take 1 capsule (60 mg total) by mouth daily.   ferrous sulfate 325 (65 FE) MG EC tablet Take 325 mg by mouth 3 (three) times daily with meals.   fluticasone (FLONASE) 50 MCG/ACT nasal spray USE TWO SPRAYS IN THE AFFECTED NOSTRIL ONE TIME DAILY   HYDROcodone-acetaminophen (NORCO) 7.5-325 MG tablet Take 1 tablet by mouth every 6 (six) hours as needed for severe pain. Must last 30 days   melatonin 3 MG TABS tablet Take 3 mg by mouth at bedtime.   metoprolol succinate (TOPROL-XL) 50 MG 24 hr tablet TAKE ONE TABLET BY MOUTH ONE TIME DAILY WITH OR IMMEDIATELY FOLLOWING A MEAL   Multiple Vitamin (MULTIVITAMIN WITH MINERALS) TABS tablet Take 1 tablet by mouth daily.   rivaroxaban (XARELTO) 20 MG TABS tablet TAKE ONE TABLET BY MOUTH ONE TIME DAILY WITH SUPPER   traZODone (DESYREL) 150 MG tablet TAKE ONE TABLET BY MOUTH AT BEDTIME     Allergies  Allergen Reactions   Bupivacaine Liposome Palpitations      Review of Systems negative except from HPI and PMH  Physical Exam BP (!) 140/92   Pulse 67   Ht 6' (1.829 m)   Wt (!) 344 lb 12.8 oz (156.4 kg)   SpO2 96%   BMI 46.76 kg/m  Well developed and nourished in no acute distress HENT normal Neck supple with JVP-  flat  Clear Irreglarly Irregular rate and rhythm with controlled  ventricular response  Abd-soft with active BS No Clubbing cyanosis edema Skin-warm and dry A & Oriented  Grossly normal sensory and motor function  ECG afib @ 667 -/10/41   CrCl cannot be calculated (Patient's most recent lab result is older than the maximum 21 days allowed.).   Assessment and  Plan Atrial Fibrillation  persistent    Anemia secondary to GI blood loss question cause possible hemorrhoids   Morbid obesity   Amiodarone therapy x 8 years   HFpEF class III  Orthostatic lightheadedness   Cardioversion while successful has not held sinus rhythm.  We will try 1 last attempt with adjunctive ranolazine.  I will also reach out to Dr. Herbert Deaner at [facility] to see whether he might be a candidate for hybrid ablation as our team is disinclined towards endovascular ablation a opinion with which I totally concur.     Orthostatic lightheadedness has improved.  Will have to keep our eye on this.  Autonomic insufficiency can sometimes be seen after gastric bypass.  Heart failure symptoms are stable.  Will reach out to Dr. Knute Neu regarding SGLT2 Encouraged him to follow back up with [facility] regarding weight loss strategies.

## 2022-08-31 NOTE — Progress Notes (Signed)
Patient Care Team: Smitty Cords, DO as PCP - General (Family Medicine) Antonieta Iba, MD as PCP - Cardiology (Cardiology) [facility] Salvia, MD as PCP - Electrophysiology (Cardiology) Earna Coder, MD as Consulting Physician (Oncology)   HPI  [name] is a 67 y.o. male seen in follow-up for atrial fibrillation of longstanding on amiodarone for many years with cardioversion infrequently.  Most recently 2024 (TG) which failed and prompted him to discontinue the amiodarone which I subsequently then resumed.  We did successfully cardioverted him 7/24   The patient denies chest pain, nocturnal dyspnea, orthopnea or peripheral edema.  There have been no palpitations, or syncope.  Complains of some orthostatic LH which has improved recently and chronic shortness of breath  .    Interval history of GI bleeding; presumed iron deficiency attributed to malabsorption in the context of gastric bypass as well as hemorrhoidal bleeding.  Treated with iron supplementation Has a hernia, and saw the gastric bypass team at [facility].  They discussed other strategies for weight loss.  No overt bleeding will continue on his anticoagulation     DATE TEST EF    12/15 Echo   45-50 %    10/16 CTA   CAScore 224  6/21 Echo   55 % LAE moderate 4.7 cm   5/24 Echo  55-60%  43 ml/m2     Date Cr K Hgb  4/21     13.1  3/23 1.13 4.4 10.7<<10.0   3/24 1.07 (4/24) 4.4(4/24) 11.4 (mcv 71)  6/24   13.3 (mcv 76)    Antiarrhythmics Date Reason stopped  Amiodarone 2016 Failure 3/24 DC by TG, resumed by SK          Records and Results Reviewed   Past Medical History:  Diagnosis Date   Anxiety    Aortic atherosclerosis (HCC)    Cardiomyopathy (in setting of Afib)    a.) TTE 12/26/2013: EF 45-50%, mild ant and antsept HK. mild MR. Mod dil LA. nl RV fxn. Rhythm was Afib; b.) TTE 07/04/2019: EF 55%, mid-apical anteroseptal HK, mild MAC, mild Ao sclerosis, G2DD, RVSP 45.3   Chronic  pain syndrome    a.) followed by pain management   Chronic, continuous use of opioids    a.) hydrocodone/APAP 7.5/325 mg; followed by pain management   Coronary artery disease 11/06/2014   a.) cCTA 11/06/2014: Ca score 224 (all in pLAD) -- 74th percentile for age/sex matched control   Diverticulosis    DJD (degenerative joint disease) of knee    History of hiatal hernia    History of kidney stones 2012   Hyperlipidemia    Hyperplastic colon polyp    Hypertension    Inguinal hernia    left   Internal hemorrhoids    Intervertebral disc disorder with radiculopathy of lumbosacral region    Long term current use FULL DOSE (325 mg) aspirin    Long term current use of amiodarone    Long term current use of antithrombotics/antiplatelets    a.) rivaroxaban   Lower extremity edema    Mixed hyperlipidemia    Myalgia due to statin    Osteoarthritis    PAF (paroxysmal atrial fibrillation) (HCC)    a.) CHA2DS2VASc = 4 (age, HTN, CHF, vascular disease history);  b.) s/p DCCV 03/13/2014 (200 J x1); c.) s/p DCCV 05/15/2019 (150 J x 1, 200 J x2); d.) rate/rhythm maintained on oral amiodarone + metoprolol succinate; chronically anticoagulated with  rivaroxaban   Pernicious anemia    Prostate cancer (HCC)    Pulmonary nodule, right    a. 10/2014 Cardiac CTA: 7mm RLL nodule; b. 04/2015 CT Chest: stable 7mm RLL nodule. No new nodules; 02/2017 CTA Chest: stable, benign, 7mm RLL pulm nodule.   Sleep difficulties    a.) takes melatonin + trazodone PRN   SVT (supraventricular tachycardia)    Tubular adenoma of colon     Past Surgical History:  Procedure Laterality Date   BICEPT TENODESIS Right 09/27/2021   Procedure: Right reverse shoulder arthroplasty, biceps tenodesis;  Surgeon: Signa Kell, MD;  Location: [facility];  Service: Orthopedics;  Laterality: Right;   CARDIOVERSION N/A 05/15/2019   Procedure: CARDIOVERSION;  Surgeon: Antonieta Iba, MD;  Location: [facility];  Service: Cardiovascular;   Laterality: N/A;   CARDIOVERSION N/A 03/13/2014   Procedure: CARDIOVERSION; Location: [facility]; Surgeon: [name], MD   CARDIOVERSION N/A 03/24/2022   Procedure: CARDIOVERSION;  Surgeon: Antonieta Iba, MD;  Location: [facility];  Service: Cardiovascular;  Laterality: N/A;   CARDIOVERSION N/A 05/19/2022   Procedure: CARDIOVERSION;  Surgeon: Antonieta Iba, MD;  Location: [facility];  Service: Cardiovascular;  Laterality: N/A;   CARDIOVERSION N/A 07/25/2022   Procedure: CARDIOVERSION;  Surgeon: [facility] Salvia, MD;  Location: [facility];  Service: Cardiovascular;  Laterality: N/A;   CARPAL TUNNEL RELEASE Right 12/23/2014   Procedure: CARPAL TUNNEL RELEASE;  Surgeon: Deeann Saint, MD;  Location: [facility];  Service: Orthopedics;  Laterality: Right;   CARPAL TUNNEL RELEASE Left 01/06/2015   Procedure: CARPAL TUNNEL RELEASE;  Surgeon: Deeann Saint, MD;  Location: [facility];  Service: Orthopedics;  Laterality: Left;   COLONOSCOPY WITH PROPOFOL N/A 10/08/2017   Procedure: COLONOSCOPY WITH PROPOFOL;  Surgeon: Toney Reil, MD;  Location: [facility] ENDOSCOPY;  Service: Gastroenterology;  Laterality: N/A;   COLONOSCOPY WITH PROPOFOL N/A 06/07/2022   Procedure: COLONOSCOPY WITH PROPOFOL;  Surgeon: Toney Reil, MD;  Location: [facility] ENDOSCOPY;  Service: Gastroenterology;  Laterality: N/A;   ENTEROSCOPY  06/07/2022   Procedure: ENTEROSCOPY;  Surgeon: Toney Reil, MD;  Location: [facility] ENDOSCOPY;  Service: Gastroenterology;;   ESOPHAGOGASTRODUODENOSCOPY (EGD) WITH PROPOFOL N/A 06/07/2022   Procedure: ESOPHAGOGASTRODUODENOSCOPY (EGD) WITH PROPOFOL;  Surgeon: Toney Reil, MD;  Location: [facility] ENDOSCOPY;  Service: Gastroenterology;  Laterality: N/A;   GASTRIC BYPASS  01/17/2000   KNEE SURGERY Right    knee trauma x3   prostate seeding     REVERSE SHOULDER ARTHROPLASTY Right 09/27/2021   Procedure: Right reverse shoulder arthroplasty, biceps tenodesis;  Surgeon: Signa Kell, MD;  Location:  [facility];  Service: Orthopedics;  Laterality: Right;   SHOULDER ARTHROSCOPY WITH ROTATOR CUFF REPAIR AND OPEN BICEPS TENODESIS Left 12/27/2021   Procedure: Left shoulder arthroscopic cuff repair (supraspinatus and subscapularis) with Regeneten Patch application;  Surgeon: Signa Kell, MD;  Location: [facility];  Service: Orthopedics;  Laterality: Left;    Current Meds  Medication Sig   amiodarone (PACERONE) 200 MG tablet TAKE ONE TABLET BY MOUTH TWICE A DAY   Ascorbic Acid (VITAMIN C PO) Take by mouth.   benazepril (LOTENSIN) 40 MG tablet TAKE ONE TABLET BY MOUTH ONE TIME DAILY   busPIRone (BUSPAR) 10 MG tablet TAKE ONE TABLET BY MOUTH FOUR TIMES A DAY AS NEEDED FOR ANXIETY   carboxymethylcellulose (REFRESH PLUS) 0.5 % SOLN 1 drop 3 (three) times daily as needed.   cyanocobalamin (VITAMIN B12) 1000 MCG/ML injection Inject 1 mL (1,000 mcg total) into the skin every 30 (thirty) days.  INJECT EVERY 30 DAYS AS DIRECTED   cyclobenzaprine (FLEXERIL) 10 MG tablet TAKE ONE TABLET BY MOUTH THREE TIMES A DAY AS NEEDED FOR MUSCLE SPASM   DULoxetine (CYMBALTA) 60 MG capsule Take 1 capsule (60 mg total) by mouth daily.   ferrous sulfate 325 (65 FE) MG EC tablet Take 325 mg by mouth 3 (three) times daily with meals.   fluticasone (FLONASE) 50 MCG/ACT nasal spray USE TWO SPRAYS IN THE AFFECTED NOSTRIL ONE TIME DAILY   HYDROcodone-acetaminophen (NORCO) 7.5-325 MG tablet Take 1 tablet by mouth every 6 (six) hours as needed for severe pain. Must last 30 days   melatonin 3 MG TABS tablet Take 3 mg by mouth at bedtime.   metoprolol succinate (TOPROL-XL) 50 MG 24 hr tablet TAKE ONE TABLET BY MOUTH ONE TIME DAILY WITH OR IMMEDIATELY FOLLOWING A MEAL   Multiple Vitamin (MULTIVITAMIN WITH MINERALS) TABS tablet Take 1 tablet by mouth daily.   rivaroxaban (XARELTO) 20 MG TABS tablet TAKE ONE TABLET BY MOUTH ONE TIME DAILY WITH SUPPER   traZODone (DESYREL) 150 MG tablet TAKE ONE TABLET BY MOUTH AT BEDTIME     Allergies  Allergen Reactions   Bupivacaine Liposome Palpitations      Review of Systems negative except from HPI and PMH  Physical Exam BP (!) 140/92   Pulse 67   Ht 6' (1.829 m)   Wt (!) 344 lb 12.8 oz (156.4 kg)   SpO2 96%   BMI 46.76 kg/m  Well developed and nourished in no acute distress HENT normal Neck supple with JVP-  flat  Clear Irreglarly Irregular rate and rhythm with controlled  ventricular response  Abd-soft with active BS No Clubbing cyanosis edema Skin-warm and dry A & Oriented  Grossly normal sensory and motor function  ECG afib @ 667 -/10/41   CrCl cannot be calculated (Patient's most recent lab result is older than the maximum 21 days allowed.).   Assessment and  Plan Atrial Fibrillation  persistent    Anemia secondary to GI blood loss question cause possible hemorrhoids   Morbid obesity   Amiodarone therapy x 8 years   HFpEF class III  Orthostatic lightheadedness   Cardioversion while successful has not held sinus rhythm.  We will try 1 last attempt with adjunctive ranolazine.  I will also reach out to Dr. Herbert Deaner at [facility] to see whether he might be a candidate for hybrid ablation as our team is disinclined towards endovascular ablation a opinion with which I totally concur.     Orthostatic lightheadedness has improved.  Will have to keep our eye on this.  Autonomic insufficiency can sometimes be seen after gastric bypass.  Heart failure symptoms are stable.  Will reach out to Dr. Knute Neu regarding SGLT2 Encouraged him to follow back up with [facility] regarding weight loss strategies.

## 2022-08-31 NOTE — Telephone Encounter (Signed)
Patient called asking how often he is to take his oral iron. He states the bottle says 1 daily and MyCHart says 1 THREE TIMES A DAY. Please advise

## 2022-08-31 NOTE — Patient Instructions (Addendum)
Medication Instructions:  Renolazine (Ranexa) 500 mg twice daily   *If you need a refill on your cardiac medications before your next appointment, please call your pharmacy*   Lab Work: CBC, BMET today for cardioversion.  If you have labs (blood work) drawn today and your tests are completely normal, you will receive your results only by: MyChart Message (if you have MyChart) OR A paper copy in the mail If you have any lab test that is abnormal or we need to change your treatment, we will call you to review the results.   Testing/Procedures: Electrical Cardioversion uses a jolt of electricity to your heart either through paddles or wired patches attached to your chest. This is a controlled, usually prescheduled, procedure. This procedure is done at the hospital and you are not awake during the procedure. You usually go home the day of the procedure. Please see the instruction sheet given to you today for more information.   Follow-Up: At [facility], you and your health needs are our priority.  As part of our continuing mission to provide you with exceptional heart care, we have created designated Provider Care Teams.  These Care Teams include your primary Cardiologist (physician) and Advanced Practice Providers (APPs -  Physician Assistants and Nurse Practitioners) who all work together to provide you with the care you need, when you need it.  We recommend signing up for the patient portal called "MyChart".  Sign up information is provided on this After Visit Summary.  MyChart is used to connect with patients for Virtual Visits (Telemedicine).  Patients are able to view lab/test results, encounter notes, upcoming appointments, etc.  Non-urgent messages can be sent to your provider as well.   To learn more about what you can do with MyChart, go to [email or web address].    Your next appointment:   4 week(s) post cardioversion   Provider:   [name], MD    Other  Instructions    You are scheduled for a Cardioversion on Thursday, September 5 with Dr. Graciela Husbands and Dr.Gollan.  Please arrive at the [facility] Entrance of [facility], [address], [place] [address] at 6:30 AM (This is 1 hour(s) prior to your procedure time).  Proceed to the Check-In Desk directly inside the entrance.  Procedure Parking: Use the entrance off of the [address] side of the hospital. Turn right upon entering and follow the driveway to parking that is directly in front of the [facility]. There is no valet parking available at this entrance, however there is an awning directly in front of the [facility] for drop off/ pick up for patients.   DIET:  Nothing to eat or drink after midnight except a sip of water with medications (see medication instructions below)  MEDICATION INSTRUCTIONS: !!IF ANY NEW MEDICATIONS ARE STARTED AFTER TODAY, PLEASE NOTIFY YOUR PROVIDER AS SOON AS POSSIBLE!!  FYI: Medications such as Semaglutide (Ozempic, [place]), Tirzepatide (Mounjaro, Zepbound), Dulaglutide (Trulicity), etc ("GLP1 agonists") AND Canagliflozin (Invokana), Dapagliflozin (Farxiga), Empagliflozin (Jardiance), Ertugliflozin (Steglatro), Bexagliflozin [employer]) or any combination with one of these drugs such as Invokamet (Canagliflozin/Metformin), Synjardy (Empagliflozin/Metformin), etc ("SGLT2 inhibitors") must be held around the time of a procedure. This is not a comprehensive list of all of these drugs. Please review all of your medications and talk to your provider if you take any one of these. If you are not sure, ask your provider.    Continue taking your anticoagulant (blood thinner): Rivaroxaban (  Xarelto).  You will need to continue this after your procedure until you are told by your provider that it is safe to stop.    LABS: CBC, BMET today   FYI:  For your safety, and to allow [place] to monitor your vital signs accurately during the  surgery/procedure we request: If you have artificial nails, gel coating, SNS etc, please have those removed prior to your surgery/procedure. Not having the nail coverings /polish removed may result in cancellation or delay of your surgery/procedure.  You must have a responsible person to drive you home and stay in the waiting area during your procedure. Failure to do so could result in cancellation.  Bring your insurance cards.  *Special Note: Every effort is made to have your procedure done on time. Occasionally there are emergencies that occur at the hospital that may cause delays. Please be patient if a delay does occur.

## 2022-09-01 ENCOUNTER — Telehealth: Payer: Self-pay | Admitting: Internal Medicine

## 2022-09-01 ENCOUNTER — Other Ambulatory Visit: Payer: Self-pay | Admitting: Internal Medicine

## 2022-09-01 ENCOUNTER — Encounter: Payer: Self-pay | Admitting: *Deleted

## 2022-09-01 ENCOUNTER — Encounter: Payer: Self-pay | Admitting: Internal Medicine

## 2022-09-01 DIAGNOSIS — I4819 Other persistent atrial fibrillation: Secondary | ICD-10-CM

## 2022-09-01 LAB — CBC
Hematocrit: 44.1 % (ref 37.5–51.0)
Hemoglobin: 14.2 g/dL (ref 13.0–17.7)
MCH: 26.2 pg — ABNORMAL LOW (ref 26.6–33.0)
MCHC: 32.2 g/dL (ref 31.5–35.7)
MCV: 81 fL (ref 79–97)
Platelets: 225 10*3/uL (ref 150–450)
RBC: 5.43 x10E6/uL (ref 4.14–5.80)
RDW: 17.1 % — ABNORMAL HIGH (ref 11.6–15.4)
WBC: 7.2 10*3/uL (ref 3.4–10.8)

## 2022-09-01 LAB — BASIC METABOLIC PANEL
BUN/Creatinine Ratio: 12 (ref 10–24)
BUN: 13 mg/dL (ref 8–27)
CO2: 25 mmol/L (ref 20–29)
Calcium: 9.2 mg/dL (ref 8.6–10.2)
Chloride: 104 mmol/L (ref 96–106)
Creatinine, Ser: 1.11 mg/dL (ref 0.76–1.27)
Glucose: 94 mg/dL (ref 70–99)
Potassium: 5.2 mmol/L (ref 3.5–5.2)
Sodium: 143 mmol/L (ref 134–144)
eGFR: 72 mL/min/{1.73_m2} (ref >59)

## 2022-09-01 NOTE — Telephone Encounter (Signed)
Patient states that he has tried the [employer] plus Iron, He has a whole bottle of them at home but the flavor makes him sick and he cannot take them due to that. Please advise regarding Iron

## 2022-09-01 NOTE — Telephone Encounter (Signed)
Call returned to patient and informed of doctor recommendations for Gentle Iron over the counter He states he would try it

## 2022-09-01 NOTE — Telephone Encounter (Signed)
Review of chart for upcoming DCCV.   DCCV [facility] 09/21/22 07:30 am Arrival time of 06:30  H&P 8/15 EKG 8/15 Labs 8/15 Ordered by Dr. Graciela Husbands To be done by Dr. Mariah Milling Follow up in 4 weeks post procedure. Sending this to scheduling.  Orders need to be placed so this encounter is being sent over to Dr. Graciela Husbands for placement.

## 2022-09-11 ENCOUNTER — Telehealth: Payer: Self-pay | Admitting: Internal Medicine

## 2022-09-11 NOTE — Telephone Encounter (Signed)
Pt states he has not received anything from a referral sent over to a office in [facility]. Please advise

## 2022-09-11 NOTE — Telephone Encounter (Signed)
Called patient, advised that Dr.Klein was going to message [facility] doctor- per last OV note. Advised that he was out of office currently, but I could send him a message, and see if there is any update on this. Patient is to see him next week for DCCV anyway and they could discuss then. Patient thankful for call back.

## 2022-09-15 ENCOUNTER — Other Ambulatory Visit: Payer: Self-pay

## 2022-09-19 ENCOUNTER — Other Ambulatory Visit: Payer: Self-pay | Admitting: Family Medicine

## 2022-09-19 DIAGNOSIS — N401 Enlarged prostate with lower urinary tract symptoms: Secondary | ICD-10-CM

## 2022-09-19 MED ORDER — TAMSULOSIN HCL 0.4 MG PO CAPS: 0.4000 mg | ORAL_CAPSULE | Freq: Every day | ORAL | 3 refills | Status: DC

## 2022-09-19 NOTE — Telephone Encounter (Signed)
He is scheduled for DCCV later this week and will review with him at that time that they had agreed to see him and I thought ( but obviously hadnt) communicated this to him Thanks SK

## 2022-09-19 NOTE — Telephone Encounter (Signed)
Noted. Thanks.

## 2022-09-20 ENCOUNTER — Telehealth: Payer: Self-pay | Admitting: *Deleted

## 2022-09-20 NOTE — Telephone Encounter (Signed)
Spoke with patient and reviewed instructions for procedure scheduled for tomorrow. He verbalized understanding and had no further questions at this time.

## 2022-09-20 NOTE — Anesthesia Preprocedure Evaluation (Signed)
Anesthesia Evaluation  Patient identified by MRN, date of birth, ID band Patient awake    Reviewed: Allergy & Precautions, NPO status , Patient's Chart, lab work & pertinent test results  History of Anesthesia Complications Negative for: history of anesthetic complications  Airway Mallampati: III       Dental  (+) Missing   Pulmonary former smoker (quit 1981)   Pulmonary exam normal breath sounds clear to auscultation       Cardiovascular hypertension, + CAD and +CHF (diastolic)  + dysrhythmias (a fib on Xarelto; SVT)  Rhythm:Irregular Rate:Normal     Neuro/Psych  PSYCHIATRIC DISORDERS Anxiety     Chronic pain    GI/Hepatic negative GI ROS,,,  Endo/Other  Class 3 obesity  Renal/GU Renal disease (CKD; nephrolithiasis)   Prostate CA    Musculoskeletal  (+) Arthritis ,    Abdominal   Peds  Hematology  (+) Blood dyscrasia, anemia   Anesthesia Other Findings   Reproductive/Obstetrics                             Anesthesia Physical Anesthesia Plan  ASA: 3  Anesthesia Plan: General   Post-op Pain Management:    Induction: Intravenous  PONV Risk Score and Plan: 2 and Propofol infusion, TIVA and Treatment may vary due to age or medical condition  Airway Management Planned: Natural Airway  Additional Equipment:   Intra-op Plan:   Post-operative Plan:   Informed Consent: I have reviewed the patients History and Physical, chart, labs and discussed the procedure including the risks, benefits and alternatives for the proposed anesthesia with the patient or authorized representative who has indicated his/her understanding and acceptance.       Plan Discussed with: CRNA  Anesthesia Plan Comments: (LMA/GETA backup discussed.  Patient consented for risks of anesthesia including but not limited to:  - adverse reactions to medications - damage to eyes, teeth, lips or other oral  mucosa - nerve damage due to positioning  - sore throat or hoarseness - damage to heart, brain, nerves, lungs, other parts of body or loss of life  Informed patient about role of CRNA in peri- and intra-operative care.  Patient voiced understanding.)       Anesthesia Quick Evaluation

## 2022-09-21 ENCOUNTER — Other Ambulatory Visit: Payer: Self-pay

## 2022-09-21 ENCOUNTER — Ambulatory Visit
Admission: RE | Admit: 2022-09-21 | Discharge: 2022-09-21 | Disposition: A | Discharge status: Home / Self Care | Payer: Medicare Other | Source: Ambulatory Visit | Attending: Internal Medicine | Admitting: Internal Medicine

## 2022-09-21 ENCOUNTER — Encounter: Payer: Self-pay | Admitting: Internal Medicine

## 2022-09-21 ENCOUNTER — Ambulatory Visit: Payer: Medicare Other | Admitting: Anesthesiology

## 2022-09-21 ENCOUNTER — Encounter
Admission: RE | Disposition: A | Discharge status: Home / Self Care | Payer: Self-pay | Source: Ambulatory Visit | Attending: Internal Medicine

## 2022-09-21 DIAGNOSIS — Z7902 Long term (current) use of antithrombotics/antiplatelets: Secondary | ICD-10-CM | POA: Diagnosis not present

## 2022-09-21 DIAGNOSIS — Z7901 Long term (current) use of anticoagulants: Secondary | ICD-10-CM | POA: Insufficient documentation

## 2022-09-21 DIAGNOSIS — Z8546 Personal history of malignant neoplasm of prostate: Secondary | ICD-10-CM | POA: Insufficient documentation

## 2022-09-21 DIAGNOSIS — Z9884 Bariatric surgery status: Secondary | ICD-10-CM | POA: Diagnosis not present

## 2022-09-21 DIAGNOSIS — Z87891 Personal history of nicotine dependence: Secondary | ICD-10-CM | POA: Diagnosis not present

## 2022-09-21 DIAGNOSIS — I4819 Other persistent atrial fibrillation: Secondary | ICD-10-CM | POA: Insufficient documentation

## 2022-09-21 DIAGNOSIS — I110 Hypertensive heart disease with heart failure: Secondary | ICD-10-CM | POA: Diagnosis not present

## 2022-09-21 DIAGNOSIS — I2510 Atherosclerotic heart disease of native coronary artery without angina pectoris: Secondary | ICD-10-CM | POA: Insufficient documentation

## 2022-09-21 DIAGNOSIS — I4891 Unspecified atrial fibrillation: Secondary | ICD-10-CM | POA: Diagnosis present

## 2022-09-21 DIAGNOSIS — I429 Cardiomyopathy, unspecified: Secondary | ICD-10-CM | POA: Insufficient documentation

## 2022-09-21 DIAGNOSIS — G894 Chronic pain syndrome: Secondary | ICD-10-CM | POA: Insufficient documentation

## 2022-09-21 DIAGNOSIS — Z6841 Body Mass Index (BMI) 40.0 and over, adult: Secondary | ICD-10-CM | POA: Insufficient documentation

## 2022-09-21 DIAGNOSIS — I130 Hypertensive heart and chronic kidney disease with heart failure and stage 1 through stage 4 chronic kidney disease, or unspecified chronic kidney disease: Secondary | ICD-10-CM | POA: Diagnosis not present

## 2022-09-21 DIAGNOSIS — I700 Atherosclerosis of aorta: Secondary | ICD-10-CM | POA: Diagnosis not present

## 2022-09-21 DIAGNOSIS — I509 Heart failure, unspecified: Secondary | ICD-10-CM | POA: Diagnosis not present

## 2022-09-21 DIAGNOSIS — D500 Iron deficiency anemia secondary to blood loss (chronic): Secondary | ICD-10-CM | POA: Insufficient documentation

## 2022-09-21 DIAGNOSIS — I480 Paroxysmal atrial fibrillation: Secondary | ICD-10-CM | POA: Diagnosis not present

## 2022-09-21 DIAGNOSIS — E782 Mixed hyperlipidemia: Secondary | ICD-10-CM | POA: Insufficient documentation

## 2022-09-21 DIAGNOSIS — N189 Chronic kidney disease, unspecified: Secondary | ICD-10-CM | POA: Diagnosis not present

## 2022-09-21 HISTORY — PX: CARDIOVERSION: SHX1299

## 2022-09-21 SURGERY — CARDIOVERSION
Anesthesia: General

## 2022-09-21 MED ORDER — PROPOFOL 10 MG/ML IV BOLUS
INTRAVENOUS | Status: DC | PRN
  Administered 2022-09-21: 20 mg via INTRAVENOUS
  Administered 2022-09-21: 80 mg via INTRAVENOUS

## 2022-09-21 MED ORDER — SODIUM CHLORIDE 0.9 % IV SOLN: INTRAVENOUS | Status: DC

## 2022-09-21 MED ORDER — ACETAMINOPHEN 160 MG/5ML PO SOLN: 325.0000 mg | ORAL | Status: DC | PRN

## 2022-09-21 MED ORDER — ONDANSETRON HCL 4 MG/2ML IJ SOLN: 4.0000 mg | Freq: Once | INTRAMUSCULAR | Status: DC | PRN

## 2022-09-21 MED ORDER — ACETAMINOPHEN 325 MG PO TABS: 650.0000 mg | ORAL_TABLET | Freq: Once | ORAL | Status: DC | PRN

## 2022-09-21 NOTE — CV Procedure (Signed)
Preop Dx AFib Post op DX  NSR  Procedure  DC Cardioversion   Pt was sedated by anesthesia receiving 100 mg Propafol  A synchronized shock 200 joules restored sinus Rhythm  Pt tolerated without difficulty

## 2022-09-21 NOTE — Transfer of Care (Signed)
Immediate Anesthesia Transfer of Care Note  Patient: [name]  Procedure(s) Performed: CARDIOVERSION  Patient Location:  spu  Anesthesia Type:General  Level of Consciousness: awake and alert   Airway & Oxygen Therapy: Patient Spontanous Breathing and Patient connected to nasal cannula oxygen  Post-op Assessment: Report given to RN and Post -op Vital signs reviewed and stable  Post vital signs: Reviewed  Last Vitals:  Vitals Value Taken Time  BP 131/86 09/21/22 0742  Temp    Pulse 44 09/21/22 0745  Resp 15 09/21/22 0745  SpO2 93 % 09/21/22 0745    Last Pain:         Complications: No notable events documented.

## 2022-09-21 NOTE — Interval H&P Note (Signed)
History and Physical Interval Note:  09/21/2022 7:38 AM  [name]  has presented today for surgery, with the diagnosis of Cardioversion   Afib  DR Graciela Husbands IS REQUESTING 2 DEFIBRILLATORS.  The various methods of treatment have been discussed with the patient and family. After consideration of risks, benefits and other options for treatment, the patient has consented to  Procedure(s): CARDIOVERSION (N/A) as a surgical intervention.  The patient's history has been reviewed, patient examined, no change in status, stable for surgery.  I have reviewed the patient's chart and labs.  Questions were answered to the patient's satisfaction.     [name]

## 2022-09-22 NOTE — Anesthesia Postprocedure Evaluation (Signed)
Anesthesia Post Note  Patient: [name]  Procedure(s) Performed: CARDIOVERSION  Patient location during evaluation: PACU Anesthesia Type: General Level of consciousness: awake and alert, oriented and patient cooperative Pain management: pain level controlled Vital Signs Assessment: post-procedure vital signs reviewed and stable Respiratory status: spontaneous breathing, nonlabored ventilation and respiratory function stable Cardiovascular status: blood pressure returned to baseline and stable Postop Assessment: adequate PO intake Anesthetic complications: no   No notable events documented.   Last Vitals:  Vitals:   09/21/22 0810 09/21/22 0820  BP: 125/82 126/85  Pulse: (!) 47 (!) 46  Resp: (!) 21 14  Temp:    SpO2: 94% 97%    Last Pain:  Vitals:   09/21/22 0820  TempSrc:   PainSc: 0-No pain                 Reed Breech

## 2022-09-25 NOTE — Group Note (Deleted)

## 2022-09-26 ENCOUNTER — Telehealth: Payer: Self-pay | Admitting: Internal Medicine

## 2022-09-26 ENCOUNTER — Other Ambulatory Visit: Payer: Self-pay | Admitting: Family Medicine

## 2022-09-26 DIAGNOSIS — D510 Vitamin B12 deficiency anemia due to intrinsic factor deficiency: Secondary | ICD-10-CM

## 2022-09-26 NOTE — Telephone Encounter (Signed)
Pt c/o medication issue:  1. Name of Medication: ranolazine (RANEXA) 500 MG 12 hr tablet   2. How are you currently taking this medication (dosage and times per day)?   3. Are you having a reaction (difficulty breathing--STAT)?  Yes   4. What is your medication issue? Patient states that this medication is giving him a headache and would like a call back to discuss further.

## 2022-09-29 ENCOUNTER — Other Ambulatory Visit: Payer: Self-pay | Admitting: Orthopedic Surgery

## 2022-09-29 ENCOUNTER — Ambulatory Visit: Payer: Medicare Other | Admitting: Cardiology

## 2022-09-29 DIAGNOSIS — M75122 Complete rotator cuff tear or rupture of left shoulder, not specified as traumatic: Secondary | ICD-10-CM

## 2022-10-03 ENCOUNTER — Other Ambulatory Visit: Payer: Medicare Other

## 2022-10-07 ENCOUNTER — Other Ambulatory Visit: Payer: Self-pay

## 2022-10-07 ENCOUNTER — Emergency Department: Payer: Medicare Other

## 2022-10-07 ENCOUNTER — Emergency Department
Admission: EM | Admit: 2022-10-07 | Discharge: 2022-10-07 | Disposition: A | Discharge status: Home / Self Care | Payer: Medicare Other | Attending: Emergency Medicine | Admitting: Emergency Medicine

## 2022-10-07 DIAGNOSIS — I4891 Unspecified atrial fibrillation: Secondary | ICD-10-CM | POA: Diagnosis not present

## 2022-10-07 DIAGNOSIS — M7989 Other specified soft tissue disorders: Secondary | ICD-10-CM | POA: Diagnosis not present

## 2022-10-07 DIAGNOSIS — S6992XA Unspecified injury of left wrist, hand and finger(s), initial encounter: Secondary | ICD-10-CM | POA: Diagnosis present

## 2022-10-07 DIAGNOSIS — S60222A Contusion of left hand, initial encounter: Secondary | ICD-10-CM | POA: Insufficient documentation

## 2022-10-07 DIAGNOSIS — I10 Essential (primary) hypertension: Secondary | ICD-10-CM | POA: Diagnosis not present

## 2022-10-07 DIAGNOSIS — S299XXA Unspecified injury of thorax, initial encounter: Secondary | ICD-10-CM | POA: Diagnosis not present

## 2022-10-07 DIAGNOSIS — Z96611 Presence of right artificial shoulder joint: Secondary | ICD-10-CM | POA: Diagnosis not present

## 2022-10-07 DIAGNOSIS — Z7901 Long term (current) use of anticoagulants: Secondary | ICD-10-CM | POA: Diagnosis not present

## 2022-10-07 DIAGNOSIS — W19XXXA Unspecified fall, initial encounter: Secondary | ICD-10-CM | POA: Insufficient documentation

## 2022-10-07 DIAGNOSIS — I2510 Atherosclerotic heart disease of native coronary artery without angina pectoris: Secondary | ICD-10-CM | POA: Insufficient documentation

## 2022-10-07 DIAGNOSIS — M1812 Unilateral primary osteoarthritis of first carpometacarpal joint, left hand: Secondary | ICD-10-CM | POA: Diagnosis not present

## 2022-10-07 DIAGNOSIS — S20212A Contusion of left front wall of thorax, initial encounter: Secondary | ICD-10-CM | POA: Diagnosis not present

## 2022-10-07 DIAGNOSIS — Z043 Encounter for examination and observation following other accident: Secondary | ICD-10-CM | POA: Diagnosis not present

## 2022-10-07 MED ORDER — OXYCODONE-ACETAMINOPHEN 5-325 MG PO TABS
1.0000 | ORAL_TABLET | Freq: Four times a day (QID) | ORAL | 0 refills | Status: AC | PRN
Start: 2022-10-07 — End: 2022-10-12

## 2022-10-07 MED ORDER — OXYCODONE-ACETAMINOPHEN 5-325 MG PO TABS
1.0000 | ORAL_TABLET | Freq: Once | ORAL | Status: AC
  Administered 2022-10-07: 1 via ORAL
  Filled 2022-10-07: qty 1

## 2022-10-07 NOTE — Discharge Instructions (Addendum)
Take the Percocet as needed for pain over the next few days and transition to Tylenol when able.  Make an appointment to follow-up with your orthopedist to reevaluate the wrist and hand.  Keep the wrist brace on until you follow-up with orthopedics.  Return to the ER for new, worsening, or persistent severe chest pain, difficulty breathing, weakness or lightheadedness, or any other new or worsening symptoms that concern you.

## 2022-10-07 NOTE — ED Provider Notes (Signed)
[facility] Provider Note    Event Date/Time   First MD Initiated Contact with Patient 10/07/22 2014     (approximate)   History   Fall (Blood thinner) and Hand Injury (left)   HPI  [name] is a 67 y.o. male with history of atrial fibrillation on Xarelto, cardiomyopathy, CAD, hypertension, hyperlipidemia, DJD, osteoarthritis, who presents with hand and chest wall injury after a fall this afternoon.  The patient states that he was helping his grandson down some steps outside when the grandson ran away causing the patient to lose balance and fall.  He hit the dorsal part of his left hand, the left side of his chest, and both knees.  He did not hit his head or lose consciousness.  He has had persistent pain to the left hand as well as to the left lower rib area underneath the left breast.  He has been able to ambulate and bear weight on both legs.  He denies any other injuries.  I viewed the past medical records.  The patient was most recently seen by cardiology on 8/15 for his atrial fibrillation and was planned for cardioversion at that time, which was performed on 9/5.   Physical Exam   Triage Vital Signs: ED Triage Vitals  Encounter Vitals Group     BP 10/07/22 1932 115/80     Systolic BP Percentile --      Diastolic BP Percentile --      Pulse Rate 10/07/22 1932 79     Resp 10/07/22 1932 16     Temp 10/07/22 1932 98 F (36.7 C)     Temp Source 10/07/22 1932 Oral     SpO2 10/07/22 1932 95 %     Weight 10/07/22 1929 (!) 337 lb (152.9 kg)     Height 10/07/22 1929 6' (1.829 m)     Head Circumference --      Peak Flow --      Pain Score 10/07/22 1929 9     Pain Loc --      Pain Education --      Exclude from Growth Chart --     Most recent vital signs: Vitals:   10/07/22 2100 10/07/22 2130  BP: (!) 153/105 (!) 177/108  Pulse: 69 69  Resp: 10 13  Temp:    SpO2: 99% 97%     General: Awake, no distress.  CV:  Good peripheral perfusion.   Resp:  Lungs CTAB.  Normal effort.  Abd:  Soft and nontender.  No distention.  Other:  Mild tenderness to the left lower anterior rib border with no step-off or crepitus.  Swelling and tenderness to the proximal dorsal left hand with full range of motion at the wrist and fingers.  2+ radial pulse.  Normal cap refill distally.  Motor and sensory intact in median, radial, and ulnar distributions.  Superficial abrasions to bilateral anterior knees.  Full range of motion to bilateral knees.  Mild effusion to the right knee.  No bony tenderness or deformity.   ED Results / Procedures / Treatments   Labs (all labs ordered are listed, but only abnormal results are displayed) Labs Reviewed - No data to display   EKG     RADIOLOGY  Chest x-ray: I independently viewed and interpreted the images; there is no focal consolidation or edema.  There is no visible displaced fracture.  XR L hand: No acute fracture.  Radiology report indicates scapholunate widening consistent with  possible ligamentous injury.   PROCEDURES:  Critical Care performed: No  Procedures   MEDICATIONS ORDERED IN ED: Medications  oxyCODONE-acetaminophen (PERCOCET/ROXICET) 5-325 MG per tablet 1 tablet (1 tablet Oral Given 10/07/22 2140)     IMPRESSION / MDM / ASSESSMENT AND PLAN / ED COURSE  I reviewed the triage vital signs and the nursing notes.  67 year old male with PMH as noted above presents with left hand and left chest wall injury after mechanical fall this afternoon.  On exam he has tenderness and swelling to the dorsum of the left hand but is neuro/vascular intact.  There is mild left lower rib area tenderness.  He is overall well-appearing and exam is otherwise unremarkable.  Differential diagnosis includes, but is not limited to, contusion, fracture.  Patient's presentation is most consistent with acute complicated illness / injury requiring diagnostic workup.  Chest x-ray shows no acute findings.   There is no evidence of displaced rib fracture or any pulmonary abnormality.  X-ray of the left hand shows findings of possible scapholunate ligamentous injury but no fracture or dislocation.  There is no indication for other imaging at this time.  The patient is stable for discharge home.  I have ordered a wrist brace.  The patient will follow-up with his orthopedist.  I have also prescribed Percocet for pain control given the likely rib contusion.  I counseled the patient on the importance of pain control and maintaining adequate respirations to prevent atelectasis and pneumonia.  I gave strict return precautions and he expressed understanding.   FINAL CLINICAL IMPRESSION(S) / ED DIAGNOSES   Final diagnoses:  Contusion of rib on left side, initial encounter  Contusion of left hand, initial encounter     Rx / DC Orders   ED Discharge Orders          Ordered    oxyCODONE-acetaminophen (PERCOCET) 5-325 MG tablet  Every 6 hours PRN        10/07/22 2133             Note:  This document was prepared using Dragon voice recognition software and may include unintentional dictation errors.    Dionne Bucy, MD 10/07/22 2317

## 2022-10-07 NOTE — ED Triage Notes (Signed)
Pt to ED c/o mechanical fall that occurred at 1130. Pt on blood thinners. Xeralto. Pt did not hit head. No LOC. Pt ambulatory in triage. Pt c/o left rib pain and left hand pain. Scrapes noted to knee bilat.

## 2022-10-09 ENCOUNTER — Encounter: Payer: Self-pay | Admitting: Internal Medicine

## 2022-10-09 ENCOUNTER — Ambulatory Visit
Admission: RE | Admit: 2022-10-09 | Discharge: 2022-10-09 | Disposition: A | Discharge status: Home / Self Care | Payer: Medicare Other | Source: Ambulatory Visit | Attending: Orthopedic Surgery

## 2022-10-09 ENCOUNTER — Ambulatory Visit: Admit: 2022-10-09 | Payer: Medicare Other | Admitting: Gastroenterology

## 2022-10-09 DIAGNOSIS — M75102 Unspecified rotator cuff tear or rupture of left shoulder, not specified as traumatic: Secondary | ICD-10-CM | POA: Diagnosis not present

## 2022-10-09 DIAGNOSIS — M75122 Complete rotator cuff tear or rupture of left shoulder, not specified as traumatic: Secondary | ICD-10-CM

## 2022-10-09 SURGERY — COLONOSCOPY WITH PROPOFOL
Anesthesia: General

## 2022-10-09 NOTE — Telephone Encounter (Signed)
If he can't take it, then we should stop,  if it is tolerable then we should proceed with DCCV again on it ( if not already done) and btw did he get set up yet to see the team at [facility] Thanks SK

## 2022-10-10 DIAGNOSIS — I4891 Unspecified atrial fibrillation: Secondary | ICD-10-CM | POA: Diagnosis not present

## 2022-10-10 NOTE — Telephone Encounter (Signed)
Called patient, advised that he had the cardioversion at the beginning of the month- most recent EKG in the system from 09/21 shows still in normal rhythm. He states that the headaches improved and he believes he was the cause of something else. He has continued with the Renexa, and he was able to see the [facility] provider this morning as well. He was advised to keep his upcoming appointment with Dr.Klein 11/06/2022.  Patient verbalized understanding.

## 2022-10-12 ENCOUNTER — Other Ambulatory Visit: Payer: Self-pay | Admitting: Orthopedic Surgery

## 2022-10-14 DIAGNOSIS — I4891 Unspecified atrial fibrillation: Secondary | ICD-10-CM | POA: Diagnosis not present

## 2022-10-15 ENCOUNTER — Other Ambulatory Visit: Payer: Self-pay | Admitting: Family Medicine

## 2022-10-15 DIAGNOSIS — G4700 Insomnia, unspecified: Secondary | ICD-10-CM

## 2022-10-16 NOTE — Telephone Encounter (Signed)
Requested Prescriptions  Pending Prescriptions Disp Refills   traZODone (DESYREL) 150 MG tablet [Pharmacy Med Name: TRAZODONE 150 MG TAB[*]] 90 tablet 1    Sig: TAKE ONE TABLET BY MOUTH AT BEDTIME     Psychiatry: Antidepressants - Serotonin Modulator Passed - 10/15/2022  8:03 AM      Passed - Valid encounter within last 6 months    Recent Outpatient Visits           2 months ago Annual physical exam   [place] [facility] Smitty Cords, DO   7 months ago Persistent atrial fibrillation [facility])   [place] [facility] Smitty Cords, DO   8 months ago Left inguinal pain   [place] [facility] Smitty Cords, DO   10 months ago Anxiety   [place] [facility] Smitty Cords, DO   1 year ago Annual physical exam   [facility] Smitty Cords, DO       Future Appointments             In 3 weeks Duke Salvia, MD [facility] [place]   In 3 months Althea Charon, Netta Neat, DO [place] [facility]

## 2022-10-30 ENCOUNTER — Inpatient Hospital Stay (HOSPITAL_BASED_OUTPATIENT_CLINIC_OR_DEPARTMENT_OTHER): Payer: Medicare Other | Admitting: Nurse Practitioner

## 2022-10-30 ENCOUNTER — Inpatient Hospital Stay: Payer: Medicare Other | Attending: Internal Medicine

## 2022-10-30 ENCOUNTER — Inpatient Hospital Stay: Payer: Medicare Other

## 2022-10-30 ENCOUNTER — Other Ambulatory Visit: Payer: Self-pay

## 2022-10-30 VITALS — BP 137/92 | HR 75 | Temp 98.4°F | Resp 18 | Ht 72.0 in | Wt 344.0 lb

## 2022-10-30 VITALS — BP 153/92

## 2022-10-30 DIAGNOSIS — K9589 Other complications of other bariatric procedure: Secondary | ICD-10-CM

## 2022-10-30 DIAGNOSIS — D649 Anemia, unspecified: Secondary | ICD-10-CM | POA: Diagnosis not present

## 2022-10-30 DIAGNOSIS — I4891 Unspecified atrial fibrillation: Secondary | ICD-10-CM | POA: Diagnosis not present

## 2022-10-30 DIAGNOSIS — E538 Deficiency of other specified B group vitamins: Secondary | ICD-10-CM | POA: Diagnosis not present

## 2022-10-30 DIAGNOSIS — Z87891 Personal history of nicotine dependence: Secondary | ICD-10-CM | POA: Diagnosis not present

## 2022-10-30 DIAGNOSIS — Z9884 Bariatric surgery status: Secondary | ICD-10-CM | POA: Insufficient documentation

## 2022-10-30 DIAGNOSIS — K649 Unspecified hemorrhoids: Secondary | ICD-10-CM | POA: Diagnosis not present

## 2022-10-30 DIAGNOSIS — Z7901 Long term (current) use of anticoagulants: Secondary | ICD-10-CM | POA: Insufficient documentation

## 2022-10-30 DIAGNOSIS — I2510 Atherosclerotic heart disease of native coronary artery without angina pectoris: Secondary | ICD-10-CM | POA: Diagnosis not present

## 2022-10-30 DIAGNOSIS — D508 Other iron deficiency anemias: Secondary | ICD-10-CM | POA: Diagnosis not present

## 2022-10-30 DIAGNOSIS — C61 Malignant neoplasm of prostate: Secondary | ICD-10-CM | POA: Diagnosis not present

## 2022-10-30 LAB — CBC WITH DIFFERENTIAL (CANCER CENTER ONLY)
Abs Immature Granulocytes: 0.03 10*3/uL (ref 0.00–0.07)
Basophils Absolute: 0 10*3/uL (ref 0.0–0.1)
Basophils Relative: 1 %
Eosinophils Absolute: 0.1 10*3/uL (ref 0.0–0.5)
Eosinophils Relative: 1 %
HCT: 43 % (ref 39.0–52.0)
Hemoglobin: 14.3 g/dL (ref 13.0–17.0)
Immature Granulocytes: 1 %
Lymphocytes Relative: 26 %
Lymphs Abs: 1.6 10*3/uL (ref 0.7–4.0)
MCH: 28.7 pg (ref 26.0–34.0)
MCHC: 33.3 g/dL (ref 30.0–36.0)
MCV: 86.3 fL (ref 80.0–100.0)
Monocytes Absolute: 0.8 10*3/uL (ref 0.1–1.0)
Monocytes Relative: 13 %
Neutro Abs: 3.7 10*3/uL (ref 1.7–7.7)
Neutrophils Relative %: 58 %
Platelet Count: 187 10*3/uL (ref 150–400)
RBC: 4.98 MIL/uL (ref 4.22–5.81)
RDW: 14.8 % (ref 11.5–15.5)
WBC Count: 6.2 10*3/uL (ref 4.0–10.5)
nRBC: 0 % (ref 0.0–0.2)

## 2022-10-30 LAB — BASIC METABOLIC PANEL
Anion gap: 5 (ref 5–15)
BUN: 12 mg/dL (ref 8–23)
CO2: 27 mmol/L (ref 22–32)
Calcium: 8.8 mg/dL — ABNORMAL LOW (ref 8.9–10.3)
Chloride: 107 mmol/L (ref 98–111)
Creatinine, Ser: 1.12 mg/dL (ref 0.61–1.24)
GFR, Estimated: >60 mL/min (ref >60)
Glucose, Bld: 103 mg/dL — ABNORMAL HIGH (ref 70–99)
Potassium: 4.2 mmol/L (ref 3.5–5.1)
Sodium: 139 mmol/L (ref 135–145)

## 2022-10-30 LAB — IRON AND TIBC
Iron: 72 ug/dL (ref 45–182)
Saturation Ratios: 21 % (ref 17.9–39.5)
TIBC: 340 ug/dL (ref 250–450)
UIBC: 268 ug/dL

## 2022-10-30 LAB — FERRITIN: Ferritin: 35 ng/mL (ref 24–336)

## 2022-10-30 LAB — VITAMIN D 25 HYDROXY (VIT D DEFICIENCY, FRACTURES): Vit D, 25-Hydroxy: 37.9 ng/mL (ref 30–100)

## 2022-10-30 MED ORDER — SODIUM CHLORIDE 0.9 % IV SOLN
200.0000 mg | Freq: Once | INTRAVENOUS | Status: AC
  Administered 2022-10-30: 200 mg via INTRAVENOUS
  Filled 2022-10-30: qty 200

## 2022-10-30 MED ORDER — SODIUM CHLORIDE 0.9 % IV SOLN
Freq: Once | INTRAVENOUS | Status: AC
  Filled 2022-10-30: qty 250

## 2022-10-30 NOTE — Progress Notes (Signed)
Patient declined to wait the 30 minutes for post iron infusion observation today. Tolerated infusion well. VSS.

## 2022-10-30 NOTE — Progress Notes (Signed)
[facility] CONSULT NOTE  Patient Care Team: Smitty Cords, DO as PCP - General (Family Medicine) Antonieta Iba, MD as PCP - Cardiology (Cardiology) Duke Salvia, MD as PCP - Electrophysiology (Cardiology) Earna Coder, MD as Consulting Physician (Oncology)  CHIEF COMPLAINTS/PURPOSE OF CONSULTATION: ANEMIA   HEMATOLOGY HISTORY  # ANEMIA[Hb; MCV-platelets- WBC; Iron sat; ferritin;  GFR- CT/US- ;  EGD- none/colonoscopy-2019 [Dr.Vanga]  # 2005- Gastric by pass [BL-400; 233; [facility]]  # Prostate cancer [seed implant]- no surgery/ Dr.Wolfe.    Latest Reference Range & Units 03/22/22 11:59  WBC 4.0 - 10.5 K/uL 7.4  RBC 4.22 - 5.81 MIL/uL 5.03  Hemoglobin 13.0 - 17.0 g/dL 81.1 (L)  HCT 91.4 - 78.2 % 35.1 (L)  MCV 80.0 - 100.0 fL 69.8 (L)  MCH 26.0 - 34.0 pg 20.5 (L)  MCHC 30.0 - 36.0 g/dL 95.6 (L)  RDW 21.3 - 08.6 % 17.8 (H)  Platelets 150 - 400 K/uL 250  (L): Data is abnormally low (H): Data is abnormally high  HISTORY OF PRESENTING ILLNESS:  [name] 67 y.o.  male pleasant patient with history of prostate cancer and anemia secondary to gastric bypass who returns to clinic for follow up and consideration of IV iron. He feels well. Enjoys spending time with his grandkids. Remains active. He fell from standing last month and was seen in ER. Denies any neurologic complaints. Denies recent fevers or illnesses. Denies any easy bleeding or bruising. No melena or hematochezia. No pica or restless leg. Reports good appetite and denies weight loss. Denies chest pain. Denies any nausea, vomiting, constipation, or diarrhea. Denies urinary complaints. Patient offers no further specific complaints today.   Review of Systems  Constitutional:  Negative for chills, diaphoresis, fever, malaise/fatigue and weight loss.  HENT:  Negative for nosebleeds and sore throat.   Eyes:  Negative for double vision.  Respiratory:  Negative for cough, hemoptysis, sputum  production, shortness of breath and wheezing.   Cardiovascular:  Negative for chest pain, palpitations, orthopnea and leg swelling.  Gastrointestinal:  Negative for abdominal pain, blood in stool, constipation, diarrhea, heartburn, melena, nausea and vomiting.  Genitourinary:  Negative for dysuria, frequency and urgency.  Musculoskeletal:  Positive for back pain and joint pain. Negative for falls.  Skin: Negative.  Negative for itching and rash.  Neurological:  Negative for dizziness, tingling, focal weakness, weakness and headaches.  Endo/Heme/Allergies:  Does not bruise/bleed easily.  Psychiatric/Behavioral:  Negative for depression. The patient is not nervous/anxious and does not have insomnia.     MEDICAL HISTORY:  Past Medical History:  Diagnosis Date   Anxiety    Aortic atherosclerosis (HCC)    Cardiomyopathy (in setting of Afib)    a.) TTE 12/26/2013: EF 45-50%, mild ant and antsept HK. mild MR. Mod dil LA. nl RV fxn. Rhythm was Afib; b.) TTE 07/04/2019: EF 55%, mid-apical anteroseptal HK, mild MAC, mild Ao sclerosis, G2DD, RVSP 45.3   Chronic pain syndrome    a.) followed by pain management   Chronic, continuous use of opioids    a.) hydrocodone/APAP 7.5/325 mg; followed by pain management   Coronary artery disease 11/06/2014   a.) cCTA 11/06/2014: Ca score 224 (all in pLAD) -- 74th percentile for age/sex matched control   Diverticulosis    DJD (degenerative joint disease) of knee    History of hiatal hernia    History of kidney stones 2012   Hyperlipidemia    Hyperplastic colon polyp  Hypertension    Inguinal hernia    left   Internal hemorrhoids    Intervertebral disc disorder with radiculopathy of lumbosacral region    Long term current use FULL DOSE (325 mg) aspirin    Long term current use of amiodarone    Long term current use of antithrombotics/antiplatelets    a.) rivaroxaban   Lower extremity edema    Mixed hyperlipidemia    Myalgia due to statin     Osteoarthritis    PAF (paroxysmal atrial fibrillation) (HCC)    a.) CHA2DS2VASc = 4 (age, HTN, CHF, vascular disease history);  b.) s/p DCCV 03/13/2014 (200 J x1); c.) s/p DCCV 05/15/2019 (150 J x 1, 200 J x2); d.) rate/rhythm maintained on oral amiodarone + metoprolol succinate; chronically anticoagulated with rivaroxaban   Pernicious anemia    Prostate cancer (HCC)    Pulmonary nodule, right    a. 10/2014 Cardiac CTA: 7mm RLL nodule; b. 04/2015 CT Chest: stable 7mm RLL nodule. No new nodules; 02/2017 CTA Chest: stable, benign, 7mm RLL pulm nodule.   Sleep difficulties    a.) takes melatonin + trazodone PRN   SVT (supraventricular tachycardia)    Tubular adenoma of colon     SURGICAL HISTORY: Past Surgical History:  Procedure Laterality Date   BICEPT TENODESIS Right 09/27/2021   Procedure: Right reverse shoulder arthroplasty, biceps tenodesis;  Surgeon: Signa Kell, MD;  Location: [facility];  Service: Orthopedics;  Laterality: Right;   CARDIOVERSION N/A 05/15/2019   Procedure: CARDIOVERSION;  Surgeon: Antonieta Iba, MD;  Location: [facility];  Service: Cardiovascular;  Laterality: N/A;   CARDIOVERSION N/A 03/13/2014   Procedure: CARDIOVERSION; Location: [facility]; Surgeon: [name], MD   CARDIOVERSION N/A 03/24/2022   Procedure: CARDIOVERSION;  Surgeon: Antonieta Iba, MD;  Location: [facility];  Service: Cardiovascular;  Laterality: N/A;   CARDIOVERSION N/A 05/19/2022   Procedure: CARDIOVERSION;  Surgeon: Antonieta Iba, MD;  Location: [facility];  Service: Cardiovascular;  Laterality: N/A;   CARDIOVERSION N/A 07/25/2022   Procedure: CARDIOVERSION;  Surgeon: Duke Salvia, MD;  Location: [facility];  Service: Cardiovascular;  Laterality: N/A;   CARDIOVERSION N/A 09/21/2022   Procedure: CARDIOVERSION;  Surgeon: Duke Salvia, MD;  Location: [facility];  Service: Cardiovascular;  Laterality: N/A;   CARPAL TUNNEL RELEASE Right 12/23/2014   Procedure: CARPAL TUNNEL RELEASE;  Surgeon: Deeann Saint, MD;  Location: [facility];  Service: Orthopedics;  Laterality: Right;   CARPAL TUNNEL RELEASE Left 01/06/2015   Procedure: CARPAL TUNNEL RELEASE;  Surgeon: Deeann Saint, MD;  Location: [facility];  Service: Orthopedics;  Laterality: Left;   COLONOSCOPY WITH PROPOFOL N/A 10/08/2017   Procedure: COLONOSCOPY WITH PROPOFOL;  Surgeon: Toney Reil, MD;  Location: [facility] ENDOSCOPY;  Service: Gastroenterology;  Laterality: N/A;   COLONOSCOPY WITH PROPOFOL N/A 06/07/2022   Procedure: COLONOSCOPY WITH PROPOFOL;  Surgeon: Toney Reil, MD;  Location: [facility] ENDOSCOPY;  Service: Gastroenterology;  Laterality: N/A;   ENTEROSCOPY  06/07/2022   Procedure: ENTEROSCOPY;  Surgeon: Toney Reil, MD;  Location: [facility] ENDOSCOPY;  Service: Gastroenterology;;   ESOPHAGOGASTRODUODENOSCOPY (EGD) WITH PROPOFOL N/A 06/07/2022   Procedure: ESOPHAGOGASTRODUODENOSCOPY (EGD) WITH PROPOFOL;  Surgeon: Toney Reil, MD;  Location: [facility] ENDOSCOPY;  Service: Gastroenterology;  Laterality: N/A;   GASTRIC BYPASS  01/17/2000   KNEE SURGERY Right    knee trauma x3   prostate seeding     REVERSE SHOULDER ARTHROPLASTY Right 09/27/2021   Procedure: Right reverse shoulder arthroplasty, biceps tenodesis;  Surgeon: Signa Kell,  MD;  Location: [facility];  Service: Orthopedics;  Laterality: Right;   SHOULDER ARTHROSCOPY WITH ROTATOR CUFF REPAIR AND OPEN BICEPS TENODESIS Left 12/27/2021   Procedure: Left shoulder arthroscopic cuff repair (supraspinatus and subscapularis) with Regeneten Patch application;  Surgeon: Signa Kell, MD;  Location: [facility];  Service: Orthopedics;  Laterality: Left;    SOCIAL HISTORY: Social History   Socioeconomic History   Marital status: Married    Spouse name: Nelva Bush   Number of children: Not on file   Years of education: Not on file   Highest education level: Not on file  Occupational History   Not on file  Tobacco Use   Smoking status: Former    Current packs/day: 0.00     Average packs/day: 1 pack/day for 10.0 years (10.0 ttl pk-yrs)    Types: Cigarettes    Start date: 02/17/1969    Quit date: 02/18/1979    Years since quitting: 43.7   Smokeless tobacco: Former    Types: Snuff  Vaping Use   Vaping status: Never Used  Substance and Sexual Activity   Alcohol use: Not Currently    Comment: last drink in 2021   Drug use: No   Sexual activity: Not on file  Other Topics Concern   Not on file  Social History Narrative   Lives in [place]; with wife, Nelva Bush. Retd from [employer]; quit in 1980; used to drink alcohol; no beer in last 2022. No indoor pets.   Social Determinants of Health   Financial Resource Strain: Medium Risk (10/03/2022)   Received from [facility]   Overall Financial Resource Strain (CARDIA)    Difficulty of Paying Living Expenses: Somewhat hard  Food Insecurity: No Food Insecurity (10/03/2022)   Received [facility]   Hunger Vital Sign    Worried About Running Out of Food in the Last Year: Never true    Ran Out of Food in the Last Year: Never true  Transportation Needs: No Transportation Needs (10/03/2022)   Received from [facility] - Transportation    Lack of Transportation (Medical): No    Lack of Transportation (Non-Medical): No  Physical Activity: Insufficiently Active (02/24/2022)   Exercise Vital Sign    Days of Exercise per Week: 2 days    Minutes of Exercise per Session: 60 min  Stress: No Stress Concern Present (02/24/2022)   [employer] of Occupational Health - Occupational Stress Questionnaire    Feeling of Stress : Not at all  Social Connections: Moderately Integrated (02/24/2022)   Social Connection and Isolation Panel [NHANES]    Frequency of Communication with Friends and Family: More than three times a week    Frequency of Social Gatherings with Friends and Family: More than three times a week    Attends Religious Services: More than 4 times per year    Active Member of [employer] or  Organizations: No    Attends Banker Meetings: Never    Marital Status: Married  Catering manager Violence: Not At Risk (04/26/2022)   Humiliation, Afraid, Rape, and Kick questionnaire    Fear of Current or Ex-Partner: No    Emotionally Abused: No    Physically Abused: No    Sexually Abused: No    FAMILY HISTORY: Family History  Problem Relation Age of Onset   Heart disease Father    Hypertension Father    Stroke Father    Cancer Father        prostate cancer  Cancer Sister    Arrhythmia Sister        A-fib   Arrhythmia Brother        A-fib   Cancer Brother     ALLERGIES:  is allergic to bupivacaine liposome.  MEDICATIONS:  Current Outpatient Medications  Medication Sig Dispense Refill   amiodarone (PACERONE) 200 MG tablet TAKE ONE TABLET BY MOUTH TWICE A DAY 180 tablet 2   benazepril (LOTENSIN) 40 MG tablet TAKE ONE TABLET BY MOUTH ONE TIME DAILY 90 tablet 0   busPIRone (BUSPAR) 10 MG tablet TAKE ONE TABLET BY MOUTH FOUR TIMES A DAY AS NEEDED FOR ANXIETY 360 tablet 1   carboxymethylcellulose (REFRESH PLUS) 0.5 % SOLN 1 drop 3 (three) times daily as needed.     cyanocobalamin (VITAMIN B12) 1000 MCG/ML injection INJECT INTO THE SKIN EVERY 30 DAYS AS DIRECTED 1 mL PRN   cyclobenzaprine (FLEXERIL) 10 MG tablet TAKE ONE TABLET BY MOUTH THREE TIMES A DAY AS NEEDED FOR MUSCLE SPASM 90 tablet 3   DULoxetine (CYMBALTA) 60 MG capsule Take 1 capsule (60 mg total) by mouth daily. 90 capsule 1   fluticasone (FLONASE) 50 MCG/ACT nasal spray USE TWO SPRAYS IN THE AFFECTED NOSTRIL ONE TIME DAILY 16 mL 1   HYDROcodone-acetaminophen (NORCO) 7.5-325 MG tablet Take 1 tablet by mouth every 6 (six) hours as needed for severe pain. Must last 30 days 120 tablet 0   melatonin 3 MG TABS tablet Take 3 mg by mouth at bedtime.     Multiple Vitamin (MULTIVITAMIN WITH MINERALS) TABS tablet Take 1 tablet by mouth daily.     ranolazine (RANEXA) 500 MG 12 hr tablet Take 1 tablet (500 mg  total) by mouth 2 (two) times daily. 180 tablet 3   rivaroxaban (XARELTO) 20 MG TABS tablet TAKE ONE TABLET BY MOUTH ONE TIME DAILY WITH SUPPER 90 tablet 2   traZODone (DESYREL) 150 MG tablet TAKE ONE TABLET BY MOUTH AT BEDTIME 90 tablet 1   No current facility-administered medications for this visit.     PHYSICAL EXAMINATION: Vitals:   10/30/22 1304  BP: (!) 137/92  Pulse: 75  Temp: 98.4 F (36.9 C)   Filed Weights   10/30/22 1304  Weight: (!) 344 lb (156 kg)    Physical Exam Vitals reviewed.  Constitutional:      Appearance: He is obese. He is not ill-appearing.     Comments: unaccompanied  HENT:     Head: Normocephalic and atraumatic.  Eyes:     Extraocular Movements: Extraocular movements intact.     Pupils: Pupils are equal, round, and reactive to light.  Pulmonary:     Effort: No respiratory distress.  Abdominal:     General: There is no distension.     Tenderness: There is no guarding.  Musculoskeletal:     Comments: ambulating  Skin:    Coloration: Skin is not pale.  Neurological:     Mental Status: He is alert and oriented to person, place, and time.  Psychiatric:        Mood and Affect: Mood normal.        Behavior: Behavior normal.      LABORATORY DATA:  I have reviewed the data as listed Lab Results  Component Value Date   WBC 6.2 10/30/2022   HGB 14.3 10/30/2022   HCT 43.0 10/30/2022   MCV 86.3 10/30/2022   PLT 187 10/30/2022   Recent Labs    04/26/22 1400 05/15/22 1459 07/04/22 1224 08/31/22 1213  10/30/22 1245  NA 138 140 140 143 139  K 4.4 4.4 4.3 5.2 4.2  CL 109 107 107 104 107  CO2 24 25 27 25 27   GLUCOSE 163* 112* 95 94 103*  BUN 18 16 17 13 12   CREATININE 1.13 1.07 1.07 1.11 1.12  CALCIUM 8.6* 8.5* 8.5* 9.2 8.8*  GFRNONAA >60 >60 >60  --  >60  PROT 6.8  --   --   --   --   ALBUMIN 4.1  --   --   --   --   AST 19  --   --   --   --   ALT 13  --   --   --   --   ALKPHOS 68  --   --   --   --   BILITOT 0.5  --   --   --    --    Iron/TIBC/Ferritin/ %Sat    Component Value Date/Time   IRON 72 10/30/2022 1245   TIBC 340 10/30/2022 1245   FERRITIN 35 10/30/2022 1245   IRONPCTSAT 21 10/30/2022 1245    ASSESSMENT & PLAN:    # Iron Deficiency Anemia- secondary to iron deficiency d/t malabsorption in setting of gastric bypass. Received IV Venofer 200 mg, last 06/30/22. Tolerated well. He is taking barimelt+iron (OTC) 2 daily. Tolerating well. Reviewed that in the setting of gastric bypass he will likely need some degree of maintenance IV iron life long due to malabsorption. Iron studies pending at time of visit but hmg has improved since April 2024 when it was 11.4, now 14.3. Microcytosis has resolved as well. He will receive venofer today. Ferritin has improved from 6 to 35. Iron sat 21. Improved. Plan to repeat labs in 4 months for possible venofer with Dr Donneta Romberg.    # Etiology of iron deficiency: Malabsorption/gastric bypass. Incidental CT February 2024-  possible mucosal lesion at the distal small bowel anastomosis status post gastric bypass. S/p  EGD/colo/ SBE [Dr.Vanga]. Diverticulosis in rectosigmoid colon and sigmoid colon with nonbleeding internal and external hemorrhoids. EGD and SBE were normal. No additional imaging or work up at this time.     # B12 def [PCP]- home b12 injections.    # CAD/A.fib-on Xarelto/amiodarone.  Monitor closely- ?  Hemorrhoidal bleeding.    # DISPOSITION: Venofer today 4 mo- lab (cbc, bmp, ferritin, iron studies), Dr Donneta Romberg, +/- venofer - la  No problem-specific Assessment & Plan notes found for this encounter.  All questions were answered. The patient knows to call the clinic with any problems, questions or concerns.   Alinda Dooms, NP 10/30/2022

## 2022-11-02 ENCOUNTER — Telehealth: Payer: Self-pay | Admitting: Internal Medicine

## 2022-11-02 NOTE — Telephone Encounter (Signed)
Noted  

## 2022-11-02 NOTE — Telephone Encounter (Signed)
Pt called and wanted a note to be made that he ONLY wants to see Dr.B and never an NP

## 2022-11-02 NOTE — Telephone Encounter (Signed)
I spoke with [name] and nothing we did led to his decision. He just prefers to see a MD and he has requested that from all of the providers he see. It's just his preference.

## 2022-11-03 ENCOUNTER — Encounter: Payer: Self-pay | Admitting: Internal Medicine

## 2022-11-06 ENCOUNTER — Encounter: Payer: Self-pay | Admitting: Internal Medicine

## 2022-11-06 ENCOUNTER — Ambulatory Visit: Payer: Medicare Other | Attending: Cardiology | Admitting: Internal Medicine

## 2022-11-06 VITALS — BP 138/86 | HR 76 | Ht 72.0 in | Wt 337.6 lb

## 2022-11-06 DIAGNOSIS — I4819 Other persistent atrial fibrillation: Secondary | ICD-10-CM | POA: Diagnosis not present

## 2022-11-06 NOTE — Patient Instructions (Addendum)
Medication Instructions:  No changes at this time.   *If you need a refill on your cardiac medications before your next appointment, please call your pharmacy*   Lab Work: None  If you have labs (blood work) drawn today and your tests are completely normal, you will receive your results only by: MyChart Message (if you have MyChart) OR A paper copy in the mail If you have any lab test that is abnormal or we need to change your treatment, we will call you to review the results.   Testing/Procedures: None   Follow-Up: At [facility], you and your health needs are our priority.  As part of our continuing mission to provide you with exceptional heart care, we have created designated Provider Care Teams.  These Care Teams include your primary Cardiologist (physician) and Advanced Practice Providers (APPs -  Physician Assistants and Nurse Practitioners) who all work together to provide you with the care you need, when you need it.    Your next appointment:   April 2025  Provider:   [name], MD

## 2022-11-06 NOTE — Progress Notes (Signed)
Patient Care Team: Smitty Cords, DO as PCP - General (Family Medicine) Antonieta Iba, MD as PCP - Cardiology (Cardiology) Duke Salvia, MD as PCP - Electrophysiology (Cardiology) Earna Coder, MD as Consulting Physician (Oncology)   HPI  [name] is a 67 y.o. male seen in follow-up for atrial fibrillation of longstanding on amiodarone for many years with cardioversion infrequently.  Most recently 2024 (TG) which failed and prompted him to discontinue the amiodarone which I subsequently then resumed.  We did successfully cardioverted him 7/24  Recurrent atrial fibrillation and has seen AG([facility]) and is scheduled for ablation 11/24  The patient denies chest pain, nocturnal dyspnea, orthopnea or peripheral edema.  There have been no palpitations, lightheadedness or syncope.  Complains of chronic shortness of breath but somewhat better since holding sinus.    Interval history of GI bleeding; presumed iron deficiency attributed to malabsorption in the context of gastric bypass as well as hemorrhoidal bleeding.  Treated with iron supplementation    No   bleeding     DATE TEST EF    12/15 Echo   45-50 %    10/16 CTA   CAScore 224  6/21 Echo   55 % LAE moderate 4.7 cm   5/24 Echo  55-60%  43 ml/m2     Date Cr K Hgb  4/21     13.1  3/23 1.13 4.4 10.7<<10.0   3/24 1.07 (4/24) 4.4(4/24) 11.4 (mcv 71)  6/24   13.3 (mcv 76)    Antiarrhythmics Date Reason stopped  Amiodarone 2016 Failure 3/24 DC by TG, resumed by SK          Records and Results Reviewed   Past Medical History:  Diagnosis Date   Anxiety    Aortic atherosclerosis (HCC)    Cardiomyopathy (in setting of Afib)    a.) TTE 12/26/2013: EF 45-50%, mild ant and antsept HK. mild MR. Mod dil LA. nl RV fxn. Rhythm was Afib; b.) TTE 07/04/2019: EF 55%, mid-apical anteroseptal HK, mild MAC, mild Ao sclerosis, G2DD, RVSP 45.3   Chronic pain syndrome    a.) followed by pain management    Chronic, continuous use of opioids    a.) hydrocodone/APAP 7.5/325 mg; followed by pain management   Coronary artery disease 11/06/2014   a.) cCTA 11/06/2014: Ca score 224 (all in pLAD) -- 74th percentile for age/sex matched control   Diverticulosis    DJD (degenerative joint disease) of knee    History of hiatal hernia    History of kidney stones 2012   Hyperlipidemia    Hyperplastic colon polyp    Hypertension    Inguinal hernia    left   Internal hemorrhoids    Intervertebral disc disorder with radiculopathy of lumbosacral region    Long term current use FULL DOSE (325 mg) aspirin    Long term current use of amiodarone    Long term current use of antithrombotics/antiplatelets    a.) rivaroxaban   Lower extremity edema    Mixed hyperlipidemia    Myalgia due to statin    Osteoarthritis    PAF (paroxysmal atrial fibrillation) (HCC)    a.) CHA2DS2VASc = 4 (age, HTN, CHF, vascular disease history);  b.) s/p DCCV 03/13/2014 (200 J x1); c.) s/p DCCV 05/15/2019 (150 J x 1, 200 J x2); d.) rate/rhythm maintained on oral amiodarone + metoprolol succinate; chronically anticoagulated with rivaroxaban   Pernicious anemia    Prostate cancer (HCC)  Pulmonary nodule, right    a. 10/2014 Cardiac CTA: 7mm RLL nodule; b. 04/2015 CT Chest: stable 7mm RLL nodule. No new nodules; 02/2017 CTA Chest: stable, benign, 7mm RLL pulm nodule.   Sleep difficulties    a.) takes melatonin + trazodone PRN   SVT (supraventricular tachycardia) (HCC)    Tubular adenoma of colon     Past Surgical History:  Procedure Laterality Date   BICEPT TENODESIS Right 09/27/2021   Procedure: Right reverse shoulder arthroplasty, biceps tenodesis;  Surgeon: Signa Kell, MD;  Location: [facility];  Service: Orthopedics;  Laterality: Right;   CARDIOVERSION N/A 05/15/2019   Procedure: CARDIOVERSION;  Surgeon: Antonieta Iba, MD;  Location: [facility];  Service: Cardiovascular;  Laterality: N/A;   CARDIOVERSION N/A 03/13/2014    Procedure: CARDIOVERSION; Location: [facility]; Surgeon: [name], MD   CARDIOVERSION N/A 03/24/2022   Procedure: CARDIOVERSION;  Surgeon: Antonieta Iba, MD;  Location: [facility];  Service: Cardiovascular;  Laterality: N/A;   CARDIOVERSION N/A 05/19/2022   Procedure: CARDIOVERSION;  Surgeon: Antonieta Iba, MD;  Location: [facility];  Service: Cardiovascular;  Laterality: N/A;   CARDIOVERSION N/A 07/25/2022   Procedure: CARDIOVERSION;  Surgeon: Duke Salvia, MD;  Location: [facility];  Service: Cardiovascular;  Laterality: N/A;   CARDIOVERSION N/A 09/21/2022   Procedure: CARDIOVERSION;  Surgeon: Duke Salvia, MD;  Location: [facility];  Service: Cardiovascular;  Laterality: N/A;   CARPAL TUNNEL RELEASE Right 12/23/2014   Procedure: CARPAL TUNNEL RELEASE;  Surgeon: Deeann Saint, MD;  Location: [facility];  Service: Orthopedics;  Laterality: Right;   CARPAL TUNNEL RELEASE Left 01/06/2015   Procedure: CARPAL TUNNEL RELEASE;  Surgeon: Deeann Saint, MD;  Location: [facility];  Service: Orthopedics;  Laterality: Left;   COLONOSCOPY WITH PROPOFOL N/A 10/08/2017   Procedure: COLONOSCOPY WITH PROPOFOL;  Surgeon: Toney Reil, MD;  Location: [facility]COPY;  Service: Gastroenterology;  Laterality: N/A;   COLONOSCOPY WITH PROPOFOL N/A 06/07/2022   Procedure: COLONOSCOPY WITH PROPOFOL;  Surgeon: Toney Reil, MD;  Location: [facility] ENDOSCOPY;  Service: Gastroenterology;  Laterality: N/A;   ENTEROSCOPY  06/07/2022   Procedure: ENTEROSCOPY;  Surgeon: Toney Reil, MD;  Location: [facility] ENDOSCOPY;  Service: Gastroenterology;;   ESOPHAGOGASTRODUODENOSCOPY (EGD) WITH PROPOFOL N/A 06/07/2022   Procedure: ESOPHAGOGASTRODUODENOSCOPY (EGD) WITH PROPOFOL;  Surgeon: Toney Reil, MD;  Location: [facility] ENDOSCOPY;  Service: Gastroenterology;  Laterality: N/A;   GASTRIC BYPASS  01/17/2000   KNEE SURGERY Right    knee trauma x3   prostate seeding     REVERSE SHOULDER ARTHROPLASTY Right 09/27/2021    Procedure: Right reverse shoulder arthroplasty, biceps tenodesis;  Surgeon: Signa Kell, MD;  Location: [facility];  Service: Orthopedics;  Laterality: Right;   SHOULDER ARTHROSCOPY WITH ROTATOR CUFF REPAIR AND OPEN BICEPS TENODESIS Left 12/27/2021   Procedure: Left shoulder arthroscopic cuff repair (supraspinatus and subscapularis) with Regeneten Patch application;  Surgeon: Signa Kell, MD;  Location: [facility];  Service: Orthopedics;  Laterality: Left;    Current Meds  Medication Sig   amiodarone (PACERONE) 200 MG tablet TAKE ONE TABLET BY MOUTH TWICE A DAY   benazepril (LOTENSIN) 40 MG tablet TAKE ONE TABLET BY MOUTH ONE TIME DAILY   busPIRone (BUSPAR) 10 MG tablet TAKE ONE TABLET BY MOUTH FOUR TIMES A DAY AS NEEDED FOR ANXIETY   carboxymethylcellulose (REFRESH PLUS) 0.5 % SOLN 1 drop 3 (three) times daily as needed.   cyanocobalamin (VITAMIN B12) 1000 MCG/ML injection INJECT INTO THE SKIN EVERY 30 DAYS AS DIRECTED  cyclobenzaprine (FLEXERIL) 10 MG tablet TAKE ONE TABLET BY MOUTH THREE TIMES A DAY AS NEEDED FOR MUSCLE SPASM   DULoxetine (CYMBALTA) 60 MG capsule Take 1 capsule (60 mg total) by mouth daily.   fluticasone (FLONASE) 50 MCG/ACT nasal spray USE TWO SPRAYS IN THE AFFECTED NOSTRIL ONE TIME DAILY   HYDROcodone-acetaminophen (NORCO) 7.5-325 MG tablet Take 1 tablet by mouth every 6 (six) hours as needed for severe pain. Must last 30 days   melatonin 3 MG TABS tablet Take 3 mg by mouth at bedtime.   Multiple Vitamin (MULTIVITAMIN WITH MINERALS) TABS tablet Take 1 tablet by mouth daily.   ranolazine (RANEXA) 500 MG 12 hr tablet Take 1 tablet (500 mg total) by mouth 2 (two) times daily.   rivaroxaban (XARELTO) 20 MG TABS tablet TAKE ONE TABLET BY MOUTH ONE TIME DAILY WITH SUPPER   traZODone (DESYREL) 150 MG tablet TAKE ONE TABLET BY MOUTH AT BEDTIME    Allergies  Allergen Reactions   Bupivacaine Liposome Palpitations      Review of Systems negative except from HPI and  PMH  Physical Exam BP 138/86 (BP Location: Right Arm, Patient Position: Supine, Cuff Size: Normal)   Pulse 76   Ht 6' (1.829 m)   Wt (!) 337 lb 9.6 oz (153.1 kg)   SpO2 95%   BMI 45.79 kg/m  Well developed and  Morbidly obese  in no acute distress HENT normal Neck supple   Clear Regular rate and rhythm, no murmurs or gallops Abd-soft with active BS No Clubbing cyanosis edema Skin-warm and dry A & Oriented  Grossly normal sensory and motor function  ECG sinus @ 76 18/10/40   Estimated Creatinine Clearance: 94.9 mL/min (by C-G formula based on SCr of 1.12 mg/dL).   Assessment and  Plan Atrial Fibrillation  persistent    Anemia secondary to GI blood loss question cause possible hemorrhoids   Morbid obesity   Amiodarone therapy x 8 years   HFpEF class III  Orthostatic lightheadedness

## 2022-11-07 ENCOUNTER — Ambulatory Visit
Payer: Medicare Other | Attending: Student in an Organized Health Care Education/Training Program | Admitting: Student in an Organized Health Care Education/Training Program

## 2022-11-07 ENCOUNTER — Encounter: Payer: Self-pay | Admitting: Student in an Organized Health Care Education/Training Program

## 2022-11-07 VITALS — BP 141/80 | HR 74 | Temp 97.4°F | Ht 72.0 in | Wt 337.0 lb

## 2022-11-07 DIAGNOSIS — G894 Chronic pain syndrome: Secondary | ICD-10-CM | POA: Diagnosis not present

## 2022-11-07 DIAGNOSIS — M19011 Primary osteoarthritis, right shoulder: Secondary | ICD-10-CM | POA: Diagnosis not present

## 2022-11-07 DIAGNOSIS — M170 Bilateral primary osteoarthritis of knee: Secondary | ICD-10-CM | POA: Insufficient documentation

## 2022-11-07 DIAGNOSIS — M47816 Spondylosis without myelopathy or radiculopathy, lumbar region: Secondary | ICD-10-CM | POA: Diagnosis not present

## 2022-11-07 DIAGNOSIS — M19012 Primary osteoarthritis, left shoulder: Secondary | ICD-10-CM | POA: Insufficient documentation

## 2022-11-07 MED ORDER — HYDROCODONE-ACETAMINOPHEN 7.5-325 MG PO TABS: 1.0000 | ORAL_TABLET | Freq: Four times a day (QID) | ORAL | 0 refills | Status: AC | PRN

## 2022-11-07 MED ORDER — HYDROCODONE-ACETAMINOPHEN 7.5-325 MG PO TABS: 1.0000 | ORAL_TABLET | Freq: Four times a day (QID) | ORAL | 0 refills | Status: DC | PRN

## 2022-11-07 NOTE — Progress Notes (Signed)
PROVIDER NOTE: Information contained herein reflects review and annotations entered in association with encounter. Interpretation of such information and data should be left to medically-trained personnel. Information provided to patient can be located elsewhere in the medical record under "Patient Instructions". Document created using STT-dictation technology, any transcriptional errors that may result from process are unintentional.    Patient: [name]  Service Category: E/M  Provider: Edward Jolly, MD  DOB: [birth year]  DOS: 11/07/2022  Specialty: Interventional Pain Management  MRN: [id number]  Setting: Ambulatory outpatient  PCP: Smitty Cords, DO  Type: Established Patient    Referring Provider: Saralyn Pilar *  Location: Office  Delivery: Face-to-face     HPI  Mr. LEMANUEL Hill, a 67 y.o. year old male, is here today because of his Lumbar facet arthropathy [M47.816]. [name] primary complain today is Finger Injury and Hand Pain (Finger right middle) Last encounter: My last encounter with him was on 08/15/22 Pertinent problems: [name] has H/O gastric bypass; DJD (degenerative joint disease) of knee; Bilateral carpal tunnel syndrome; Persistent atrial fibrillation (HCC); Chronic radicular lumbar pain; Lumbar radiculopathy; Lumbar degenerative disc disease; Lumbar facet arthropathy; Lumbar facet joint syndrome; Primary osteoarthritis of both wrists; Trigger middle finger of left hand; and Chronic pain syndrome on their pertinent problem list. Pain Assessment: Severity of Chronic pain is reported as a 3 /10. Location: Finger (Comment which one) Right (middle)/Denies. Onset: More than a month ago. Quality: Aching, Constant, Burning. Timing: Constant. Modifying factor(s): Meds. Vitals:  height is 6' (1.829 m) and weight is 337 lb (152.9 kg) (abnormal). His temperature is 97.4 F (36.3 C) (abnormal). His blood pressure is 141/80 (abnormal) and his pulse is 74. His oxygen  saturation is 98%.   Reason for encounter: medication management   -increased low back pain related to lumbar facet arthopathy -s/p b/l L3,4,5 RFA 05/01/22 which provided 80% pain relief for 6 months and now having gradual return of pain -also having increased b/l knee pain related to knee OA, s/p b/l knee monovisc wants to continue to monitor his symptoms. -He has upcoming left shoulder reverse total arthroplasty.    Pharmacotherapy Assessment  Analgesic:  hydrocodone 7.5 mg 3 times daily as needed     Monitoring: [place] PMP: PDMP reviewed during this encounter.       Pharmacotherapy: No side-effects or adverse reactions reported. Compliance: No problems identified. Effectiveness: Clinically acceptable.  Brigitte Pulse, RN  11/07/2022 12:45 PM  Sign when Signing Visit Nursing Pain Medication Assessment:  Safety precautions to be maintained throughout the outpatient stay will include: orient to surroundings, keep bed in low position, maintain call bell within reach at all times, provide assistance with transfer out of bed and ambulation.  Medication Inspection Compliance: Pill count conducted under aseptic conditions, in front of the patient. Neither the pills nor the bottle was removed from the patient's sight at any time. Once count was completed pills were immediately returned to the patient in their original bottle.  Medication: Hydrocodone/APAP Pill/Patch Count:  53 of 120 pills remain Pill/Patch Appearance: Markings consistent with prescribed medication Bottle Appearance: Standard pharmacy container. Clearly labeled. Filled Date: 4 / 3 / 2024 Last Medication intake:  TodaySafety precautions to be maintained throughout the outpatient stay will include: orient to surroundings, keep bed in low position, maintain call bell within reach at all times, provide assistance with transfer out of bed and ambulation.      UDS:  Summary  Date Value Ref Range Status  08/15/2022  Note  Final     Comment:    ==================================================================== [facility] (MW) ==================================================================== Test                             Result       Flag       Units  Drug Present and Declared for Prescription Verification   Hydrocodone                    5111         EXPECTED   ng/mg creat   Hydromorphone                  68           EXPECTED   ng/mg creat   Dihydrocodeine                 525          EXPECTED   ng/mg creat   Norhydrocodone                 2358         EXPECTED   ng/mg creat    Sources of hydrocodone include scheduled prescription medications.    Hydromorphone, dihydrocodeine and norhydrocodone are expected    metabolites of hydrocodone. Hydromorphone and dihydrocodeine are    also available as scheduled prescription medications.  ==================================================================== Test                      Result    Flag   Units      Ref Range   Creatinine              137              mg/dL      >=16 ==================================================================== Declared Medications:  The flagging and interpretation on this report are based on the  following declared medications.  Unexpected results may arise from  inaccuracies in the declared medications.   **Note: The testing scope of this panel includes these medications:   Hydrocodone (Norco)   **Note: The testing scope of this panel does not include the  following reported medications:   Acetaminophen (Norco)  Amiodarone  Benazepril (Lotensin)  Buspirone (Buspar)  Cyclobenzaprine (Flexeril)  Duloxetine (Cymbalta)  Eye Drop  Fluticasone (Flonase)  Iron  Melatonin  Metoprolol  Multivitamin  Rivaroxaban (Xarelto)  Trazodone  Vitamin B12  Vitamin C ==================================================================== For clinical consultation, please call [phone]. ====================================================================      ROS  Constitutional: Denies any fever or chills Gastrointestinal: No reported hemesis, hematochezia, vomiting, or acute GI distress Musculoskeletal:  Low back pain, bilateral knee pain Neurological: No reported episodes of acute onset apraxia, aphasia, dysarthria, agnosia, amnesia, paralysis, loss of coordination, or loss of consciousness  Medication Review  DULoxetine, HYDROcodone-acetaminophen, amiodarone, benazepril, busPIRone, carboxymethylcellulose, cyanocobalamin, cyclobenzaprine, fluticasone, melatonin, multivitamin with minerals, ranolazine, rivaroxaban, and traZODone  History Review  Allergy: Mr. Brettschneider is allergic to bupivacaine liposome. Drug: Mr. Bostrom  reports no history of drug use. Alcohol:  reports that he does not currently use alcohol. Tobacco:  reports that he quit smoking about 43 years ago. His smoking use included cigarettes. He started smoking about 53 years ago. He has a 10 pack-year smoking history. He has quit using smokeless tobacco.  His smokeless tobacco use included snuff. Social: Mr. Oliva  reports that he quit smoking about 43 years ago. His  smoking use included cigarettes. He started smoking about 53 years ago. He has a 10 pack-year smoking history. He has quit using smokeless tobacco.  His smokeless tobacco use included snuff. He reports that he does not currently use alcohol. He reports that he does not use drugs. Medical:  has a past medical history of Anxiety, Aortic atherosclerosis (HCC), Cardiomyopathy (in setting of Afib), Chronic pain syndrome, Chronic, continuous use of opioids, Coronary artery disease (11/06/2014), Diverticulosis, DJD (degenerative joint disease) of knee, History of hiatal hernia, History of kidney stones (2012), Hyperlipidemia, Hyperplastic colon polyp, Hypertension, Inguinal hernia, Internal hemorrhoids, Intervertebral disc disorder with radiculopathy  of lumbosacral region, Long term current use FULL DOSE (325 mg) aspirin, Long term current use of amiodarone, Long term current use of antithrombotics/antiplatelets, Lower extremity edema, Mixed hyperlipidemia, Myalgia due to statin, Osteoarthritis, PAF (paroxysmal atrial fibrillation) (HCC), Pernicious anemia, Prostate cancer (HCC), Pulmonary nodule, right, Sleep difficulties, SVT (supraventricular tachycardia) (HCC), and Tubular adenoma of colon. Surgical: Mr. Chipley  has a past surgical history that includes Knee surgery (Right); Gastric bypass (01/17/2000); prostate seeding; Carpal tunnel release (Right, 12/23/2014); Carpal tunnel release (Left, 01/06/2015); Colonoscopy with propofol (N/A, 10/08/2017); Cardioversion (N/A, 05/15/2019); Cardioversion (N/A, 03/13/2014); Reverse shoulder arthroplasty (Right, 09/27/2021); Bicept tenodesis (Right, 09/27/2021); Shoulder arthroscopy with rotator cuff repair and open biceps tenodesis (Left, 12/27/2021); Cardioversion (N/A, 03/24/2022); Cardioversion (N/A, 05/19/2022); Colonoscopy with propofol (N/A, 06/07/2022); Esophagogastroduodenoscopy (egd) with propofol (N/A, 06/07/2022); enteroscopy (06/07/2022); Cardioversion (N/A, 07/25/2022); and Cardioversion (N/A, 09/21/2022). Family: family history includes Arrhythmia in his brother and sister; Cancer in his brother, father, and sister; Heart disease in his father; Hypertension in his father; Stroke in his father.  Laboratory Chemistry Profile   Renal Lab Results  Component Value Date   BUN 12 10/30/2022   CREATININE 1.12 10/30/2022   BCR 12 08/31/2022   GFRAA 72 07/21/2020   GFRNONAA >60 10/30/2022    Hepatic Lab Results  Component Value Date   AST 19 04/26/2022   ALT 13 04/26/2022   ALBUMIN 4.1 04/26/2022   ALKPHOS 68 04/26/2022   LIPASE 28 03/13/2017    Electrolytes Lab Results  Component Value Date   NA 139 10/30/2022   K 4.2 10/30/2022   CL 107 10/30/2022   CALCIUM 8.8 (L) 10/30/2022   MG 2.0  02/01/2021    Bone Lab Results  Component Value Date   VD25OH 37.90 10/30/2022   25OHVITD1 32 02/01/2021   25OHVITD2 <1.0 02/01/2021   25OHVITD3 32 02/01/2021    Inflammation (CRP: Acute Phase) (ESR: Chronic Phase) Lab Results  Component Value Date   CRP 1.7 02/09/2022   ESRSEDRATE 6 02/09/2022         Note: Above Lab results reviewed.    Physical Exam  General appearance: Well nourished, well developed, and well hydrated. In no apparent acute distress Mental status: Alert, oriented x 3 (person, place, & time)       Respiratory: No evidence of acute respiratory distress Eyes: PERLA Vitals: BP (!) 141/80   Pulse 74   Temp (!) 97.4 F (36.3 C)   Ht 6' (1.829 m)   Wt (!) 337 lb (152.9 kg)   SpO2 98%   BMI 45.71 kg/m  BMI: Estimated body mass index is 45.71 kg/m as calculated from the following:   Height as of this encounter: 6' (1.829 m).   Weight as of this encounter: 337 lb (152.9 kg). Ideal: Ideal body weight: 77.6 kg (171 lb 1.2 oz) Adjusted ideal body weight: 107.7 kg (237 lb 7.1  oz)  Right wrist pain worse with wrist extension  Lumbar Spine Area Exam  Skin & Axial Inspection: No masses, redness, or swelling Alignment: Symmetrical Functional ROM: Pain restricted ROM       Stability: No instability detected Muscle Tone/Strength: Functionally intact. No obvious neuro-muscular anomalies detected. Sensory (Neurological): Musculoskeletal pain pattern, related to lumbar facets, positive pain with lumbar extension and facet loading  Gait & Posture Assessment  Ambulation: Unassisted Gait: Relatively normal for age and body habitus Posture: WNL  Lower Extremity Exam      Side: Right lower extremity   Side: Left lower extremity  Stability: No instability observed           Stability: No instability observed          Skin & Extremity Inspection: Skin color, temperature, and hair growth are WNL. No peripheral edema or cyanosis. No masses, redness, swelling, asymmetry,  or associated skin lesions. No contractures.   Skin & Extremity Inspection: Skin color, temperature, and hair growth are WNL. No peripheral edema or cyanosis. No masses, redness, swelling, asymmetry, or associated skin lesions. No contractures.  Functional ROM: Pain restricted ROM for hip and knee joints           Functional ROM: Pain restricted ROM for hip and knee joints          Muscle Tone/Strength: Functionally intact. No obvious neuro-muscular anomalies detected.   Muscle Tone/Strength: Functionally intact. No obvious neuro-muscular anomalies detected.  Sensory (Neurological): Arthropathic        Sensory (Neurological): Arthropathic       DTR: Patellar: deferred today Achilles: deferred today Plantar: deferred today   DTR: Patellar: deferred today Achilles: deferred today Plantar: deferred today  Palpation: No palpable anomalies   Palpation: No palpable anomalies     Assessment   Status Diagnosis  Worsening Worsening Worsening 1. Lumbar facet arthropathy   2. Lumbar facet joint syndrome   3. Bilateral primary osteoarthritis of knee   4. Chronic pain syndrome   5. Localized osteoarthritis of shoulder regions, bilateral          Plan of Care   1. Lumbar facet arthropathy - LUMBAR FACET(MEDIAL BRANCH NERVE BLOCK) MBNB; Future  2. Lumbar facet joint syndrome - LUMBAR FACET(MEDIAL BRANCH NERVE BLOCK) MBNB; Future  3. Bilateral primary osteoarthritis of knee  4. Chronic pain syndrome - LUMBAR FACET(MEDIAL BRANCH NERVE BLOCK) MBNB; Future - HYDROcodone-acetaminophen (NORCO) 7.5-325 MG tablet; Take 1 tablet by mouth every 6 (six) hours as needed for severe pain (pain score 7-10). Must last 30 days  Dispense: 120 tablet; Refill: 0 - HYDROcodone-acetaminophen (NORCO) 7.5-325 MG tablet; Take 1 tablet by mouth every 6 (six) hours as needed for severe pain (pain score 7-10). Must last 30 days  Dispense: 120 tablet; Refill: 0 - HYDROcodone-acetaminophen (NORCO) 7.5-325 MG  tablet; Take 1 tablet by mouth every 6 (six) hours as needed for severe pain (pain score 7-10). Must last 30 days  Dispense: 120 tablet; Refill: 0  5. Localized osteoarthritis of shoulder regions, bilateral - LUMBAR FACET(MEDIAL BRANCH NERVE BLOCK) MBNB; Future - HYDROcodone-acetaminophen (NORCO) 7.5-325 MG tablet; Take 1 tablet by mouth every 6 (six) hours as needed for severe pain (pain score 7-10). Must last 30 days  Dispense: 120 tablet; Refill: 0 - HYDROcodone-acetaminophen (NORCO) 7.5-325 MG tablet; Take 1 tablet by mouth every 6 (six) hours as needed for severe pain (pain score 7-10). Must last 30 days  Dispense: 120 tablet; Refill: 0 - HYDROcodone-acetaminophen (NORCO) 7.5-325 MG tablet; Take  1 tablet by mouth every 6 (six) hours as needed for severe pain (pain score 7-10). Must last 30 days  Dispense: 120 tablet; Refill: 0  Patient will let me know when he wants to repeat his bilateral knee Monovisc injections.  Pharmacotherapy (Medications Ordered): Meds ordered this encounter  Medications   HYDROcodone-acetaminophen (NORCO) 7.5-325 MG tablet    Sig: Take 1 tablet by mouth every 6 (six) hours as needed for severe pain (pain score 7-10). Must last 30 days    Dispense:  120 tablet    Refill:  0    Chronic Pain: STOP Act (Not applicable) Fill 1 day early if closed on refill date. Avoid benzodiazepines within 8 hours of opioids   HYDROcodone-acetaminophen (NORCO) 7.5-325 MG tablet    Sig: Take 1 tablet by mouth every 6 (six) hours as needed for severe pain (pain score 7-10). Must last 30 days    Dispense:  120 tablet    Refill:  0    Chronic Pain: STOP Act (Not applicable) Fill 1 day early if closed on refill date. Avoid benzodiazepines within 8 hours of opioids   HYDROcodone-acetaminophen (NORCO) 7.5-325 MG tablet    Sig: Take 1 tablet by mouth every 6 (six) hours as needed for severe pain (pain score 7-10). Must last 30 days    Dispense:  120 tablet    Refill:  0    Chronic Pain:  STOP Act (Not applicable) Fill 1 day early if closed on refill date. Avoid benzodiazepines within 8 hours of opioids   Orders Placed This Encounter  Procedures   LUMBAR FACET(MEDIAL BRANCH NERVE BLOCK) MBNB    Standing Status:   Future    Standing Expiration Date:   02/07/2023    Scheduling Instructions:     Procedure: Lumbar Facet, Medial Branch Radiofrequency Ablation (RFA)      Laterality:  Bilateral       Level: L3, L4, and L5 Medial Branch Level(s). These levels will denervate the L3-4 and L4-5 lumbar facet joints.      Imaging: Fluoroscopy-guided             Anesthesia: Local anesthesia (1-2% Lidocaine)     Anxiolysis:10 mg PO Valium    Order Specific Question:   Where will this procedure be performed?    Answer:   [facility]      Follow-up plan:   Return in about 8 weeks (around 01/03/2023) for B/L L3, 4, 5 RFA , in clinic (PO Valium) stop Eliquis 3 days prior.     Recent Visits Date Type Provider Dept  08/15/22 Office Visit Edward Jolly, MD [facility]  Showing recent visits within past 90 days and meeting all other requirements Today's Visits Date Type Provider Dept  11/07/22 Office Visit Edward Jolly, MD [facility]  Showing today's visits and meeting all other requirements Future Appointments No visits were found meeting these conditions. Showing future appointments within next 90 days and meeting all other requirements  I discussed the assessment and treatment plan with the patient. The patient was provided an opportunity to ask questions and all were answered. The patient agreed with the plan and demonstrated an understanding of the instructions.  Patient advised to call back or seek an in-person evaluation if the symptoms or condition worsens.  Duration of encounter: 30 minutes.  Note by: Edward Jolly, MD Date: 11/07/2022; Time: 3:09 PM

## 2022-11-07 NOTE — Patient Instructions (Addendum)
Stop Xarelto for 3 days  prior to procedure.  Take blood pressure med tha morning of the procedure  Preparing for Procedure with Sedation Instructions: Oral Intake: Do not eat or drink anything for at least 8 hours prior to your procedure. Transportation: Public transportation is not allowed. Bring an adult driver. The driver must be physically present in our waiting room before any procedure can be started. Physical Assistance: Bring an adult capable of physically assisting you, in the event you need help. Blood Pressure Medicine: Take your blood pressure medicine with a sip of water the morning of the procedure. Insulin: Take only  of your normal insulin dose. Preventing infections: Shower with an antibacterial soap the morning of your procedure. Build-up your immune system: Take 1000 mg of Vitamin C with every meal (3 times a day) the day prior to your procedure. Pregnancy: If you are pregnant, call and cancel the procedure. Sickness: If you have a cold, fever, or any active infections, call and cancel the procedure. Arrival: You must be in the facility at least 30 minutes prior to your scheduled procedure. Children: Do not bring children with you. Dress appropriately: Bring dark clothing that you would not mind if they get stained. Valuables: Do not bring any jewelry or valuables. Procedure appointments are reserved for interventional treatments only. No Prescription Refills. No medication changes will be discussed during procedure appointments. No disability issues will be discussed.

## 2022-11-07 NOTE — Progress Notes (Signed)
Nursing Pain Medication Assessment:  Safety precautions to be maintained throughout the outpatient stay will include: orient to surroundings, keep bed in low position, maintain call bell within reach at all times, provide assistance with transfer out of bed and ambulation.  Medication Inspection Compliance: Pill count conducted under aseptic conditions, in front of the patient. Neither the pills nor the bottle was removed from the patient's sight at any time. Once count was completed pills were immediately returned to the patient in their original bottle.  Medication: Hydrocodone/APAP Pill/Patch Count:  53 of 120 pills remain Pill/Patch Appearance: Markings consistent with prescribed medication Bottle Appearance: Standard pharmacy container. Clearly labeled. Filled Date: 36 / 3 / 2024 Last Medication intake:  TodaySafety precautions to be maintained throughout the outpatient stay will include: orient to surroundings, keep bed in low position, maintain call bell within reach at all times, provide assistance with transfer out of bed and ambulation.

## 2022-11-08 ENCOUNTER — Telehealth: Payer: Self-pay

## 2022-11-08 NOTE — Telephone Encounter (Signed)
I have him scheduled for RFA on 12/18/22. He said we are supposed to get clearance for him to stop blood thinners for 3 days.

## 2022-11-08 NOTE — Telephone Encounter (Signed)
Dr Edward Jolly would like clearance for Mr Moos to stop his Xarelto for 3 days prior to having a Lumbar Facet block on 12-18-22.  Thank you

## 2022-11-09 ENCOUNTER — Telehealth: Payer: Self-pay | Admitting: Student in an Organized Health Care Education/Training Program

## 2022-11-09 ENCOUNTER — Telehealth: Payer: Self-pay

## 2022-11-09 ENCOUNTER — Other Ambulatory Visit: Payer: Self-pay

## 2022-11-09 ENCOUNTER — Ambulatory Visit: Payer: Self-pay

## 2022-11-09 ENCOUNTER — Encounter: Payer: Self-pay | Admitting: *Deleted

## 2022-11-09 ENCOUNTER — Emergency Department
Admission: EM | Admit: 2022-11-09 | Discharge: 2022-11-09 | Disposition: A | Discharge status: Home / Self Care | Payer: Medicare Other | Attending: Emergency Medicine | Admitting: Emergency Medicine

## 2022-11-09 ENCOUNTER — Emergency Department: Payer: Medicare Other

## 2022-11-09 DIAGNOSIS — M2241 Chondromalacia patellae, right knee: Secondary | ICD-10-CM | POA: Diagnosis not present

## 2022-11-09 DIAGNOSIS — I2510 Atherosclerotic heart disease of native coronary artery without angina pectoris: Secondary | ICD-10-CM | POA: Insufficient documentation

## 2022-11-09 DIAGNOSIS — M25461 Effusion, right knee: Secondary | ICD-10-CM | POA: Diagnosis not present

## 2022-11-09 DIAGNOSIS — Z8546 Personal history of malignant neoplasm of prostate: Secondary | ICD-10-CM | POA: Insufficient documentation

## 2022-11-09 DIAGNOSIS — M25561 Pain in right knee: Secondary | ICD-10-CM | POA: Insufficient documentation

## 2022-11-09 DIAGNOSIS — I10 Essential (primary) hypertension: Secondary | ICD-10-CM | POA: Diagnosis not present

## 2022-11-09 DIAGNOSIS — M1711 Unilateral primary osteoarthritis, right knee: Secondary | ICD-10-CM | POA: Diagnosis not present

## 2022-11-09 MED ORDER — DEXAMETHASONE SODIUM PHOSPHATE 10 MG/ML IJ SOLN
10.0000 mg | Freq: Once | INTRAMUSCULAR | Status: AC
  Administered 2022-11-09: 10 mg via INTRAMUSCULAR
  Filled 2022-11-09: qty 1

## 2022-11-09 NOTE — Discharge Instructions (Addendum)
Your xrays were negative today. Wear your knee brace for additional support. Try to keep your knee elevated and ice as needed. This will decrease swelling and improve your pain.  Continue to take your pain medication as needed.  Please follow-up with orthopedics.  This can be with a doctor that you already have an established relationship with or you can reach out to Dr. Allena Katz who's information is attached to this paperwork.

## 2022-11-09 NOTE — ED Provider Notes (Signed)
[facility] Provider Note    Event Date/Time   First MD Initiated Contact with Patient 11/09/22 1921     (approximate)   History   Fall   HPI  [name] is a 67 y.o. male  with PMH of A-fib, cardiomyopathy, hypertension, osteoarthritis, CAD, prostate cancer, chronic pain syndrome presents for evaluation of right knee pain after a fall.  Patient states that he had a fall last week on Friday where he tripped coming up the stairs landing on his knee.  Then last night he fell getting into bed landing on his right knee again.       Physical Exam   Triage Vital Signs: ED Triage Vitals  Encounter Vitals Group     BP 11/09/22 1526 (!) 127/96     Systolic BP Percentile --      Diastolic BP Percentile --      Pulse Rate 11/09/22 1526 71     Resp 11/09/22 1526 20     Temp 11/09/22 1526 98.9 F (37.2 C)     Temp src --      SpO2 --      Weight 11/09/22 1527 (!) 337 lb (152.9 kg)     Height 11/09/22 1527 6' (1.829 m)     Head Circumference --      Peak Flow --      Pain Score 11/09/22 1527 7     Pain Loc --      Pain Education --      Exclude from Growth Chart --     Most recent vital signs: Vitals:   11/09/22 1526  BP: (!) 127/96  Pulse: 71  Resp: 20  Temp: 98.9 F (37.2 C)    General: Awake, no distress.  CV:  Good peripheral perfusion.  Resp:  Normal effort.  Abd:  No distention.  Other:  No TTP over the knee joint but is sore over the lateral hamstrings tendons, ROM of knee maintained but does cause pain   ED Results / Procedures / Treatments   Labs (all labs ordered are listed, but only abnormal results are displayed) Labs Reviewed - No data to display   RADIOLOGY  Right knee xrays obtained, I interpreted the images as well as reviewed the radiologist report which was negative for fracture and dislocation but did show arthritis.   PROCEDURES:  Critical Care performed: No  Procedures   MEDICATIONS ORDERED IN  ED: Medications  dexamethasone (DECADRON) injection 10 mg (has no administration in time range)     IMPRESSION / MDM / ASSESSMENT AND PLAN / ED COURSE  I reviewed the triage vital signs and the nursing notes.                             67 year old male presents for evaluation of right knee pain after multiple mechanical falls. VSS in triage, patient in mild pain.   Differential diagnosis includes, but is not limited to, knee fracture, dislocation, ligament injury, meniscus injury, muscle strain.  Patient's presentation is most consistent with acute complicated illness / injury requiring diagnostic workup.  Right knee xrays obtained, I interpreted the images which were negative.  Given his reports of multiple mechanical falls and some instability I am concerned about a possible ligament or meniscus injury, however most of his pain is over the lateral hamstrings tendon which leads me to believe this is more of a muscle strain.  Advised patient to continue taking his pain medication that send previously prescribed.  Since he cannot take NSAIDs due to to him being on blood thinners he was given a dose of dexamethasone while in the ED today.  I instructed him to wear his knee brace and to follow-up with orthopedics.  He voiced understanding, all questions were answered and he was stable at discharge.      FINAL CLINICAL IMPRESSION(S) / ED DIAGNOSES   Final diagnoses:  Acute pain of right knee     Rx / DC Orders   ED Discharge Orders     None        Note:  This document was prepared using Dragon voice recognition software and may include unintentional dictation errors.   Wayne Ali, PA-C 11/09/22 1951    Pilar Jarvis, MD 11/10/22 [phone]

## 2022-11-09 NOTE — ED Provider Triage Note (Signed)
Emergency Medicine Provider Triage Evaluation Note  [name] , a 67 y.o. male  was evaluated in triage.  Pt complains of fall. Pain to the posterior right knee and lateral right leg. Patient fell last night and has had a few falls in the past month.   Review of Systems  Positive: Right leg pain, right leg weakness Negative: Numbness and tingling  Physical Exam  There were no vitals taken for this visit. Gen:   Awake, no distress   Resp:  Normal effort  MSK:   Moves extremities without difficulty  Other:    Medical Decision Making  Medically screening exam initiated at 3:22 PM.  Appropriate orders placed.  [name] was informed that the remainder of the evaluation will be completed by another provider, this initial triage assessment does not replace that evaluation, and the importance of remaining in the ED until their evaluation is complete.     Cameron Ali, PA-C 11/09/22 1527

## 2022-11-09 NOTE — Telephone Encounter (Signed)
Chief Complaint: Fall Symptoms: Right knee pain 15/10, popping sound from knee, hip pain, knee swelling Frequency: constant, 2 falls in 1 week  Pertinent Negatives: Patient denies redness, open wounds, cuts or bruises  Disposition: [x] ED /[] Urgent Care (no appt availability in office) / [] Appointment(In office/virtual)/ []  [facility]/ [] Home Care/ [] Refused Recommended Disposition /[] [place] [facility]/ []  Follow-up with PCP Additional Notes: Patient states he has severe knee pain and it is progressively getting worse. Patient states he has had multiple falls since the end of September with the most recent fall being last night. Patient stated his right gave out and he fell in his bedroom landing on the right side of his body including the right knee. Patient reports the pain is 15/10 on the pain scale and he takes oxycodone that is not helping. Patient requested an appointment. Care advice given and no appointments available today. Patient stated he will go to the ED because he can not take the pain.  Reason for Disposition  Injury (or injuries) that need emergency care  Answer Assessment - Initial Assessment Questions 1. MECHANISM: "How did the fall happen?"     I fell at home in my bedroom last night, Friday fell going up the stairs  2. DOMESTIC VIOLENCE AND ELDER ABUSE SCREENING: "Did you fall because someone pushed you or tried to hurt you?" If Yes, ask: "Are you safe now?"     No  3. ONSET: "When did the fall happen?" (e.g., minutes, hours, or days ago)     Yesterday and Friday  4. LOCATION: "What part of the body hit the ground?" (e.g., back, buttocks, head, hips, knees, hands, head, stomach)     Right side of my body mostly my hip 5. INJURY: "Did you hurt (injure) yourself when you fell?" If Yes, ask: "What did you injure? Tell me more about this?" (e.g., body area; type of injury; pain severity)"     Right knee  6. PAIN: "Is there any pain?" If Yes, ask: "How bad is the  pain?" (e.g., Scale 1-10; or mild,  moderate, severe)   - NONE (0): No pain   - MILD (1-3): Doesn't interfere with normal activities    - MODERATE (4-7): Interferes with normal activities or awakens from sleep    - SEVERE (8-10): Excruciating pain, unable to do any normal activities      10/10 7. SIZE: For cuts, bruises, or swelling, ask: "How large is it?" (e.g., inches or centimeters)      Chronic knee swelling  9. OTHER SYMPTOMS: "Do you have any other symptoms?" (e.g., dizziness, fever, weakness; new onset or worsening).      A popping sound coming from the right knee 10. CAUSE: "What do you think caused the fall (or falling)?" (e.g., tripped, dizzy spell)       Right knee gave out  Protocols used: Falls and [facility]

## 2022-11-09 NOTE — Telephone Encounter (Signed)
Called patient and informed him that we do not have an order for a knee injection but that as soon as Dr. Cherylann Ratel returns from vacation, we will ask him about it and follow up with patient. Patient also wanted to move his (lumbar) RFA to a sooner date but I informed him that we need to get the clearance for blood thinner cessation before we change any scheduling. Patient understands.

## 2022-11-09 NOTE — Telephone Encounter (Signed)
PT stated that he spoke with Lateef at his last visit and an order was suppose to been place for him to get a injection in his right knee

## 2022-11-09 NOTE — ED Triage Notes (Signed)
BIB family from home after 3 recent falls, last fall today. Describes R leg as week and giving out. C/o knee pain. Alert, NAD, calm, interactive, resps e/u, speaking in clear compete sentences. EDPA present for triage. Able to walk, unstable though. Able to bend knee 90 degrees with pain. Took hydrocodone 7.5/325 PTA. Rates pain 7/10.

## 2022-11-09 NOTE — Telephone Encounter (Signed)
Transition Care Management Follow-up Telephone Call Date of discharge and from where: [facility] 9/21 How have you been since you were released from the hospital? Patient is having pain in right leg and fell last night due to pain. Pt said he will be setting up a follow up visit  Any questions or concerns? No  Items Reviewed: Did the pt receive and understand the discharge instructions provided? Yes  Medications obtained and verified? Yes  Other? No  Any new allergies since your discharge? No  Dietary orders reviewed? No Do you have support at home? Yes    Follow up appointments reviewed:  PCP Hospital f/u appt confirmed? No  Scheduled to see  on  @ . Specialist Hospital f/u appt confirmed? No  Scheduled to see  on  @ . Are transportation arrangements needed? No  If their condition worsens, is the pt aware to call PCP or go to the Emergency Dept.? Yes Was the patient provided with contact information for the PCP's office or ED? Yes Was to pt encouraged to call back with questions or concerns? Yes

## 2022-11-14 ENCOUNTER — Other Ambulatory Visit: Payer: Self-pay | Admitting: Cardiovascular Disease

## 2022-11-14 DIAGNOSIS — I10 Essential (primary) hypertension: Secondary | ICD-10-CM

## 2022-11-14 NOTE — Telephone Encounter (Signed)
Please contact pt for future appointment. Pt due for 6 month f/u. 

## 2022-11-15 NOTE — Telephone Encounter (Signed)
Pt is due with his general cardiologist Dr. Mariah Milling. Pt see's Graciela Husbands for EP. I will send in 30 day refill but he is due for f/u with general cardiology.

## 2022-11-15 NOTE — Telephone Encounter (Signed)
Patient was just seen on 11/06/22 with [name], pt is scheduled for an ablation and will follow up after procedure for appts

## 2022-11-16 ENCOUNTER — Other Ambulatory Visit: Payer: Self-pay | Admitting: Cardiovascular Disease

## 2022-11-16 DIAGNOSIS — I4891 Unspecified atrial fibrillation: Secondary | ICD-10-CM | POA: Diagnosis not present

## 2022-11-16 DIAGNOSIS — I10 Essential (primary) hypertension: Secondary | ICD-10-CM

## 2022-11-17 DIAGNOSIS — Z7901 Long term (current) use of anticoagulants: Secondary | ICD-10-CM | POA: Diagnosis not present

## 2022-11-17 DIAGNOSIS — Z79899 Other long term (current) drug therapy: Secondary | ICD-10-CM | POA: Diagnosis not present

## 2022-11-17 DIAGNOSIS — M1990 Unspecified osteoarthritis, unspecified site: Secondary | ICD-10-CM | POA: Diagnosis not present

## 2022-11-17 DIAGNOSIS — I4891 Unspecified atrial fibrillation: Secondary | ICD-10-CM | POA: Diagnosis not present

## 2022-11-17 DIAGNOSIS — I10 Essential (primary) hypertension: Secondary | ICD-10-CM | POA: Diagnosis not present

## 2022-11-17 DIAGNOSIS — I4819 Other persistent atrial fibrillation: Secondary | ICD-10-CM | POA: Diagnosis not present

## 2022-11-17 HISTORY — PX: ATRIAL FIBRILLATION ABLATION: SHX5732

## 2022-11-18 DIAGNOSIS — I4891 Unspecified atrial fibrillation: Secondary | ICD-10-CM | POA: Diagnosis not present

## 2022-11-20 ENCOUNTER — Other Ambulatory Visit: Payer: Self-pay | Admitting: Family Medicine

## 2022-11-21 NOTE — Telephone Encounter (Signed)
Requested Prescriptions  Pending Prescriptions Disp Refills   fluticasone (FLONASE) 50 MCG/ACT nasal spray [Pharmacy Med Name: FLUTICASONE 50 MCG SPRAY] 16 mL 1    Sig: USE TWO SPRAYS IN EACH NOSTRIL ONE TIME DAILY     Ear, Nose, and Throat: Nasal Preparations - Corticosteroids Passed - 11/20/2022  9:00 PM      Passed - Valid encounter within last 12 months    Recent Outpatient Visits           3 months ago Annual physical exam   [place] [facility] Smitty Cords, [place]   9 months ago Persistent atrial fibrillation [facility])   [place] [facility] Smitty Cords, DO   9 months ago Left inguinal pain   [place] [facility] Smitty Cords, [place]   11 months ago Anxiety   [place] [facility] Smitty Cords, [place]   1 year ago Annual physical exam   [place] [facility] Smitty Cords, DO       Future Appointments             In 2 months Althea Charon, Netta Neat, DO [facility], [facility]

## 2022-12-10 ENCOUNTER — Other Ambulatory Visit: Payer: Self-pay | Admitting: Cardiovascular Disease

## 2022-12-10 DIAGNOSIS — I10 Essential (primary) hypertension: Secondary | ICD-10-CM

## 2022-12-18 ENCOUNTER — Telehealth: Payer: Self-pay | Admitting: Cardiovascular Disease

## 2022-12-18 ENCOUNTER — Ambulatory Visit: Payer: Medicare Other | Admitting: Student in an Organized Health Care Education/Training Program

## 2022-12-18 NOTE — Telephone Encounter (Signed)
   Pre-operative Risk Assessment    Patient Name: [name]  DOB: 1956-01-01 MRN: [id number]      Request for Surgical Clearance    Procedure:   LT Reverse Shoulder Arthroplasty, Biceps Tenodesis  Date of Surgery:  Clearance 02/06/23                                 Surgeon:  Signa Kell, MD Surgeon's Group or Practice Name:  [facility] Phone number:  [phone] Fax number:  [phone]   Type of Clearance Requested:   - Medical    Type of Anesthesia:  Not Indicated   Additional requests/questions:    [name]   12/18/2022, 3:43 PM

## 2022-12-19 ENCOUNTER — Other Ambulatory Visit: Payer: Self-pay | Admitting: Family Medicine

## 2022-12-19 DIAGNOSIS — F419 Anxiety disorder, unspecified: Secondary | ICD-10-CM

## 2022-12-19 NOTE — Telephone Encounter (Signed)
I s/w the receptionist at Dr. Eliane Decree office. I asked to s/w the nurse or surgery scheduler for Dr. Allena Katz. Receptionist came back on the line stating she could not reach the nurse. I then left a verbal message that they could call me back at [phone] to discuss, though I did also state that I will fax these notes to their office today as well.   In regard to once the pt can proceed per the ok from cardiology, we will need a new request to be sent to our office fax [phone] attn: pre op team

## 2022-12-19 NOTE — Telephone Encounter (Signed)
Please advise Dr. Allena Katz and [facility] that he cannot have surgery until at least 90 days post ablation due anticoagulation therapy. His ablation appeared to be on 11/17/2022.

## 2022-12-21 NOTE — Telephone Encounter (Signed)
Requested Prescriptions  Pending Prescriptions Disp Refills   busPIRone (BUSPAR) 10 MG tablet [Pharmacy Med Name: BUSPIRONE 10 MG TAB[*]] 360 tablet 1    Sig: TAKE ONE TABLET BY MOUTH FOUR TIMES A DAY AS NEEDED FOR ANIXETY     Psychiatry: Anxiolytics/Hypnotics - Non-controlled Passed - 12/19/2022 12:45 PM      Passed - Valid encounter within last 12 months    Recent Outpatient Visits           4 months ago Annual physical exam   [place] [facility] Smitty Cords, DO   10 months ago Persistent atrial fibrillation [facility])   [place] [facility] Smitty Cords, DO   10 months ago Left inguinal pain   [place] [facility] Smitty Cords, DO   1 year ago Anxiety   [place] [facility] Smitty Cords, DO   1 year ago Annual physical exam   [place] [facility] Smitty Cords, DO       Future Appointments             In 1 month Althea Charon, Netta Neat, DO [place] [facility]

## 2022-12-28 ENCOUNTER — Telehealth: Payer: Self-pay | Admitting: Student in an Organized Health Care Education/Training Program

## 2022-12-28 DIAGNOSIS — M65331 Trigger finger, right middle finger: Secondary | ICD-10-CM

## 2022-12-28 DIAGNOSIS — M170 Bilateral primary osteoarthritis of knee: Secondary | ICD-10-CM

## 2022-12-28 DIAGNOSIS — M65332 Trigger finger, left middle finger: Secondary | ICD-10-CM

## 2022-12-28 NOTE — Telephone Encounter (Signed)
Patient is calling to see if Dr Cherylann Ratel will give him some injections in his right trigger finger and his knee. He cannot stop the Xarelto right now.He wants to see if he can come in for the injections and not have to do eval first. His insurance does not require authorization.  I explained that Dr Cherylann Ratel wont be in until Tues.

## 2023-01-02 ENCOUNTER — Ambulatory Visit: Payer: Medicare Other

## 2023-01-02 NOTE — Addendum Note (Signed)
Addended by: Edward Jolly on: 01/02/2023 10:15 AM   Modules accepted: Orders

## 2023-01-03 ENCOUNTER — Other Ambulatory Visit: Payer: Self-pay | Admitting: Medical Genetics

## 2023-01-04 DIAGNOSIS — L570 Actinic keratosis: Secondary | ICD-10-CM | POA: Diagnosis not present

## 2023-01-04 DIAGNOSIS — L821 Other seborrheic keratosis: Secondary | ICD-10-CM | POA: Diagnosis not present

## 2023-01-08 ENCOUNTER — Ambulatory Visit
Payer: Medicare Other | Attending: Student in an Organized Health Care Education/Training Program | Admitting: Student in an Organized Health Care Education/Training Program

## 2023-01-08 ENCOUNTER — Encounter: Payer: Self-pay | Admitting: Student in an Organized Health Care Education/Training Program

## 2023-01-08 DIAGNOSIS — M170 Bilateral primary osteoarthritis of knee: Secondary | ICD-10-CM | POA: Insufficient documentation

## 2023-01-08 MED ORDER — LIDOCAINE HCL 2 % IJ SOLN
20.0000 mL | Freq: Once | INTRAMUSCULAR | Status: AC
  Administered 2023-01-08: 400 mg

## 2023-01-08 MED ORDER — SODIUM HYALURONATE (VISCOSUP) 16.8 MG/2ML IX SOSY
16.8000 mg | PREFILLED_SYRINGE | Freq: Once | INTRA_ARTICULAR | Status: AC
  Administered 2023-01-08: 16.8 mg via INTRA_ARTICULAR
  Filled 2023-01-08: qty 2

## 2023-01-08 NOTE — Progress Notes (Signed)
Safety precautions to be maintained throughout the outpatient stay will include: orient to surroundings, keep bed in low position, maintain call bell within reach at all times, provide assistance with transfer out of bed and ambulation.  

## 2023-01-08 NOTE — Patient Instructions (Signed)

## 2023-01-08 NOTE — Progress Notes (Signed)
PROVIDER NOTE: Interpretation of information contained herein should be left to medically-trained personnel. Specific patient instructions are provided elsewhere under "Patient Instructions" section of medical record. This document was created in part using STT-dictation technology, any transcriptional errors that may result from this process are unintentional.  Patient: [name] Type: Established DOB: [birth year] MRN: [id number] PCP: Smitty Cords, DO  Service: Procedure DOS: 01/08/2023 Setting: Ambulatory Location: Ambulatory outpatient facility Delivery: Face-to-face Provider: Edward Jolly, MD Specialty: Interventional Pain Management Specialty designation: 09 Location: Outpatient facility Ref. Prov.: Edward Jolly, MD       Interventional Therapy   Type:  Gelsyn-3 Intra-articular Knee Injection #1  Laterality: Bilateral (-50) Level/approach: Medial Imaging guidance: None required ([id number]) Anesthesia: Local anesthesia (1-2% Lidocaine) DOS: 01/08/2023  Performed by: Edward Jolly, MD  Purpose: Diagnostic/Therapeutic Indications: Knee arthralgia associated to osteoarthritis of the knee 1. Bilateral primary osteoarthritis of knee    NAS-11 score:   Pre-procedure: 4 /10   Post-procedure: 2 /10     Pre-Procedure Preparation  Monitoring: As per clinic protocol.  Risk Assessment: Vitals:  QMV:HQIONGEXB body mass index is 47.06 kg/m as calculated from the following:   Height as of this encounter: 6' (1.829 m).   Weight as of this encounter: 347 lb (157.4 kg)., Rate:(!) 103 , BP:(!) 185/92, Resp:16, Temp:(!) 97.3 F (36.3 C), SpO2:99 %  Allergies: He is allergic to bupivacaine liposome.  Precautions: No additional precautions required  Blood-thinner(s): None at this time  Coagulopathies: Reviewed. None identified.   Active Infection(s): Reviewed. None identified. [name] is afebrile   Location setting: Exam room Position: Sitting w/ knee bent 90  degrees Safety Precautions: Patient was assessed for positional comfort and pressure points before starting the procedure. Prepping solution: DuraPrep (Iodine Povacrylex [0.7% available iodine] and Isopropyl Alcohol, 74% w/w) Prep Area: Entire knee region Approach: percutaneous, just above the tibial plateau, lateral to the infrapatellar tendon. Intended target: Intra-articular knee space Materials: Tray: Block Needle(s): Regular Qty: 1/side Length: 1.5-inch Gauge: 25G (x1) + 22G (x1)  Meds ordered this encounter  Medications   sodium hyaluronate (viscosup) (GELSYN-3) intra-articular injection 16.8 mg    Do not substitute. Deliver to facility day before procedure.   sodium hyaluronate (viscosup) (GELSYN-3) intra-articular injection 16.8 mg    Do not substitute. Deliver to facility day before procedure.   lidocaine (XYLOCAINE) 2 % (with pres) injection 400 mg    No orders of the defined types were placed in this encounter.    Time-out: 1135 I initiated and conducted the "Time-out" before starting the procedure, as per protocol. The patient was asked to participate by confirming the accuracy of the "Time Out" information. Verification of the correct person, site, and procedure were performed and confirmed by me, the nursing staff, and the patient. "Time-out" conducted as per Joint Commission's Universal Protocol (UP.01.01.01). Procedure checklist: Completed  H&P (Pre-op  Assessment)  [name] is a 67 y.o. (year old), male patient, seen today for interventional treatment. He  has a past surgical history that includes Knee surgery (Right); Gastric bypass (01/17/2000); prostate seeding; Carpal tunnel release (Right, 12/23/2014); Carpal tunnel release (Left, 01/06/2015); Colonoscopy with propofol (N/A, 10/08/2017); Cardioversion (N/A, 05/15/2019); Cardioversion (N/A, 03/13/2014); Reverse shoulder arthroplasty (Right, 09/27/2021); Bicept tenodesis (Right, 09/27/2021); Shoulder arthroscopy with  rotator cuff repair and open biceps tenodesis (Left, 12/27/2021); Cardioversion (N/A, 03/24/2022); Cardioversion (N/A, 05/19/2022); Colonoscopy with propofol (N/A, 06/07/2022); Esophagogastroduodenoscopy (egd) with propofol (N/A, 06/07/2022); enteroscopy (06/07/2022); Cardioversion (N/A, 07/25/2022); and Cardioversion (N/A, 09/21/2022). [name] has a current medication  list which includes the following prescription(s): amiodarone, benazepril, buspirone, carboxymethylcellulose, cyanocobalamin, cyclobenzaprine, duloxetine, fluticasone, hydrocodone-acetaminophen, [START ON 01/17/2023] hydrocodone-acetaminophen, melatonin, multivitamin with minerals, xarelto, trazodone, and ranolazine. His primarily concern today is the Knee Pain  He is allergic to bupivacaine liposome.   Last encounter: My last encounter with him was on 12/28/2022. Pertinent problems: [name] has H/O gastric bypass; DJD (degenerative joint disease) of knee; Bilateral carpal tunnel syndrome; Persistent atrial fibrillation (HCC); Chronic radicular lumbar pain; Lumbar radiculopathy; Lumbar degenerative disc disease; Lumbar facet arthropathy; Lumbar facet joint syndrome; Primary osteoarthritis of both wrists; Trigger middle finger of left hand; and Chronic pain syndrome on their pertinent problem list. Pain Assessment: Severity of Chronic pain is reported as a 4 /10. Location: Knee Right, Left/denies. Onset: More than a month ago. Quality: Aching, Constant, Moaning. Timing: Constant. Modifying factor(s): rest. Vitals:  height is 6' (1.829 m) and weight is 347 lb (157.4 kg) (abnormal). His temperature is 97.3 F (36.3 C) (abnormal). His blood pressure is 185/92 (abnormal) and his pulse is 103 (abnormal). His respiration is 16 and oxygen saturation is 99%.   Reason for encounter: "interventional pain management therapy due pain of at least four (4) weeks in duration, with failure to respond and/or inability to tolerate more conservative care.  Site  Confirmation: [name] was asked to confirm the procedure and laterality before marking the site.  Consent: Before the procedure and under the influence of no sedative(s), amnesic(s), or anxiolytics, the patient was informed of the treatment options, risks and possible complications. To fulfill our ethical and legal obligations, as recommended by the American Medical Association's Code of Ethics, I have informed the patient of my clinical impression; the nature and purpose of the treatment or procedure; the risks, benefits, and possible complications of the intervention; the alternatives, including doing nothing; the risk(s) and benefit(s) of the alternative treatment(s) or procedure(s); and the risk(s) and benefit(s) of doing nothing. The patient was provided information about the general risks and possible complications associated with the procedure. These may include, but are not limited to: failure to achieve desired goals, infection, bleeding, organ or nerve damage, allergic reactions, paralysis, and death. In addition, the patient was informed of those risks and complications associated to Spine-related procedures, such as failure to decrease pain; infection (i.e.: Meningitis, epidural or intraspinal abscess); bleeding (i.e.: epidural hematoma, subarachnoid hemorrhage, or any other type of intraspinal or peri-dural bleeding); organ or nerve damage (i.e.: Any type of peripheral nerve, nerve root, or spinal cord injury) with subsequent damage to sensory, motor, and/or autonomic systems, resulting in permanent pain, numbness, and/or weakness of one or several areas of the body; allergic reactions; (i.e.: anaphylactic reaction); and/or death. Furthermore, the patient was informed of those risks and complications associated with the medications. These include, but are not limited to: allergic reactions (i.e.: anaphylactic or anaphylactoid reaction(s)); adrenal axis suppression; blood sugar elevation that in  diabetics may result in ketoacidosis or comma; water retention that in patients with history of congestive heart failure may result in shortness of breath, pulmonary edema, and decompensation with resultant heart failure; weight gain; swelling or edema; medication-induced neural toxicity; particulate matter embolism and blood vessel occlusion with resultant organ, and/or nervous system infarction; and/or aseptic necrosis of one or more joints. Finally, the patient was informed that Medicine is not an exact science; therefore, there is also the possibility of unforeseen or unpredictable risks and/or possible complications that may result in a catastrophic outcome. The patient indicated having understood very clearly. We  have given the patient no guarantees and we have made no promises. Enough time was given to the patient to ask questions, all of which were answered to the patient's satisfaction. Mr. Carnal has indicated that he wanted to continue with the procedure. Attestation: I, the ordering provider, attest that I have discussed with the patient the benefits, risks, side-effects, alternatives, likelihood of achieving goals, and potential problems during recovery for the procedure that I have provided informed consent.  Date  Time: 01/08/2023 11:24 AM  Description of procedure  Start Time: 1135 hrs  Local Anesthesia: Once the patient was positioned, prepped, and time-out was completed. The target area was identified located. The skin was marked with an approved surgical skin marker. Once marked, the skin (epidermis, dermis, and hypodermis), and deeper tissues (fat, connective tissue and muscle) were infiltrated with a small amount of a short-acting local anesthetic, loaded on a 10cc syringe with a 25G, 1.5-in  Needle. An appropriate amount of time was allowed for local anesthetics to take effect before proceeding to the next step. Local Anesthetic: Lidocaine 1-2% The unused portion of the local  anesthetic was discarded in the proper designated containers. Safety Precautions: Aspiration looking for blood return was conducted prior to all injections. At no point did I inject any substances, as a needle was being advanced. Before injecting, the patient was told to immediately notify me if he was experiencing any new onset of "ringing in the ears, or metallic taste in the mouth". No attempts were made at seeking any paresthesias. Safe injection practices and needle disposal techniques used. Medications properly checked for expiration dates. SDV (single dose vial) medications used. After the completion of the procedure, all disposable equipment used was discarded in the proper designated medical waste containers.  Technical description: Protocol guidelines were followed. After positioning, the target area was identified and prepped in the usual manner. Skin & deeper tissues infiltrated with local anesthetic. Appropriate amount of time allowed to pass for local anesthetics to take effect. Proper needle placement secured. Once satisfactory needle placement was confirmed, I proceeded to inject the desired solution in slow, incremental fashion, intermittently assessing for discomfort or any signs of abnormal or undesired spread of substance. Once completed, the needle was removed and disposed of, as per hospital protocols. The area was cleaned, making sure to leave some of the prepping solution back to take advantage of its long term bactericidal properties.  Aspiration:   For the right knee, patient had a knee effusion and approximately 63 cc of fluid was removed.        Vitals:   01/08/23 1126  BP: (!) 185/92  Pulse: (!) 103  Resp: 16  Temp: (!) 97.3 F (36.3 C)  SpO2: 99%  Weight: (!) 347 lb (157.4 kg)  Height: 6' (1.829 m)    End Time: 1143 hrs  Imaging guidance  Imaging-assisted Technique: None required. Indication(s): N/A Exposure Time: N/A Contrast: None Fluoroscopic Guidance:  N/A Ultrasound Guidance: N/A Interpretation: N/A  Post-op assessment  Post-procedure Vital Signs:  Pulse/HCG Rate: (!) 103  Temp: (!) 97.3 F (36.3 C) Resp: 16 BP: (!) 185/92 SpO2: 99 %  EBL: None  Complications: No immediate post-treatment complications observed by team, or reported by patient.  Note: The patient tolerated the entire procedure well. A repeat set of vitals were taken after the procedure and the patient was kept under observation following institutional policy, for this type of procedure. Post-procedural neurological assessment was performed, showing return to baseline, prior to discharge.  The patient was provided with post-procedure discharge instructions, including a section on how to identify potential problems. Should any problems arise concerning this procedure, the patient was given instructions to immediately contact us, at any time, without hesitation. In any case, we plan to contact the patient by telephone for a follow-up status report regarding this interventional procedure.  Comments:  No additional relevant information.  Plan of care  Chronic Opioid Analgesic:   hydrocodone 7.5 mg 3 times daily as needed     Medications administered: We administered sodium hyaluronate (viscosup), sodium hyaluronate (viscosup), and lidocaine.  Follow-up plan:   Return for Keep sch. appt.     Recent Visits Date Type Provider Dept  11/07/22 Office Visit Edward Jolly, MD [facility]  Showing recent visits within past 90 days and meeting all other requirements Today's Visits Date Type Provider Dept  01/08/23 Procedure visit Edward Jolly, MD [facility]  Showing today's visits and meeting all other requirements Future Appointments Date Type Provider Dept  02/13/23 Appointment Edward Jolly, MD [facility]  Showing future appointments within next 90 days and meeting all other requirements   Disposition: Discharge home  Discharge (Date   Time): 01/08/2023;   hrs.   Primary Care Physician: Smitty Cords, DO Location: [facility] Note by: Edward Jolly, MD Date: 01/08/2023; Time: 2:11 PM  DISCLAIMER: Medicine is not an Visual merchandiser. It has no guarantees or warranties. The decision to proceed with this intervention was based on the information collected from the patient. Conclusions were drawn from the patient's questionnaire, interview, and examination. Because information was provided in large part by the patient, it cannot be guaranteed that it has not been purposely or unconsciously manipulated or altered. Every effort has been made to obtain as much accurate, relevant, available data as possible. Always take into account that the treatment will also be dependent on availability of resources and existing treatment guidelines, considered by other Pain Management Specialists as being common knowledge and practice, at the time of the intervention. It is also important to point out that variation in procedural techniques and pharmacological choices are the acceptable norm. For Medico-Legal review purposes, the indications, contraindications, technique, and results of the these procedures should only be evaluated, judged and interpreted by a Board-Certified Interventional Pain Specialist with extensive familiarity and expertise in the same exact procedure and technique.

## 2023-01-09 ENCOUNTER — Telehealth: Payer: Self-pay

## 2023-01-09 NOTE — Telephone Encounter (Signed)
Called PP. Denies any needs at this time. Patient states his knee feels better after removing the fluid from it. Instructed to call if needed

## 2023-01-16 DIAGNOSIS — I4891 Unspecified atrial fibrillation: Secondary | ICD-10-CM | POA: Diagnosis not present

## 2023-01-20 ENCOUNTER — Other Ambulatory Visit: Payer: Self-pay | Admitting: Family Medicine

## 2023-01-22 ENCOUNTER — Ambulatory Visit: Payer: Medicare Other | Admitting: Family Medicine

## 2023-01-23 ENCOUNTER — Telehealth: Payer: Self-pay | Admitting: Internal Medicine

## 2023-01-23 DIAGNOSIS — I4891 Unspecified atrial fibrillation: Secondary | ICD-10-CM | POA: Diagnosis not present

## 2023-01-23 NOTE — Telephone Encounter (Signed)
 Routed back to requesting surgeon's office to make them aware pt had already been cleared.

## 2023-01-23 NOTE — Telephone Encounter (Signed)
 It appears clearance was completed by [facility] on 01/16/23.  Levi Aland, NP-C  01/23/2023, 12:13 PM [address] Office [phone] Fax [phone]

## 2023-01-23 NOTE — Telephone Encounter (Signed)
   Pre-operative Risk Assessment    Patient Name: [name]  DOB: 1955/04/02 MRN: [id number]  Date of last office visit: 11/06/22 Date of next office visit: 04/2023   Request for Surgical Clearance    Procedure:   LT REVERSE SHOULDER ARTHROPLASTY BICEPS TENODESIS  Date of Surgery:  Clearance 02/20/23                                 Surgeon:  EARNESTINE BLANCH, MD Surgeon's Group or Practice Name:  [facility] Phone number:  [phone] Fax number:  [phone]  Type of Clearance Requested:   - Medical    Type of Anesthesia:  Not Indicated   Additional requests/questions:    Signed, [name]   01/23/2023, 11:10 AM

## 2023-01-23 NOTE — Telephone Encounter (Signed)
 Requested Prescriptions  Pending Prescriptions Disp Refills   fluticasone  (FLONASE ) 50 MCG/ACT nasal spray [Pharmacy Med Name: FLUTICASONE  50 MCG SPRAY] 16 mL 1    Sig: USE TWO SPRAYS IN EACH NOSTRIL ONE TIME DAILY     Ear, Nose, and Throat: Nasal Preparations - Corticosteroids Passed - 01/23/2023  2:33 PM      Passed - Valid encounter within last 12 months    Recent Outpatient Visits           5 months ago Annual physical exam   [place] Marsa PARAS, DO   11 months ago Persistent atrial fibrillation [facility])   [facility] Marsa PARAS, [place]   11 months ago Left inguinal pain   [place] [facility] Marsa PARAS, DO   1 year ago Anxiety   [place] [facility] Marsa PARAS, DO   1 year ago Annual physical exam   [place] [facility] [place], Marsa PARAS, DO       Future Appointments             Tomorrow [place] Marsa PARAS, DO [place] [facility], [facility]

## 2023-01-24 ENCOUNTER — Encounter: Payer: Self-pay | Admitting: Family Medicine

## 2023-01-24 ENCOUNTER — Ambulatory Visit (INDEPENDENT_AMBULATORY_CARE_PROVIDER_SITE_OTHER): Payer: Medicare Other | Admitting: Family Medicine

## 2023-01-24 ENCOUNTER — Other Ambulatory Visit: Payer: Self-pay | Admitting: Family Medicine

## 2023-01-24 VITALS — BP 140/88 | HR 71 | Ht 72.0 in | Wt 348.0 lb

## 2023-01-24 DIAGNOSIS — D510 Vitamin B12 deficiency anemia due to intrinsic factor deficiency: Secondary | ICD-10-CM

## 2023-01-24 DIAGNOSIS — R7309 Other abnormal glucose: Secondary | ICD-10-CM | POA: Diagnosis not present

## 2023-01-24 DIAGNOSIS — I4819 Other persistent atrial fibrillation: Secondary | ICD-10-CM

## 2023-01-24 DIAGNOSIS — I10 Essential (primary) hypertension: Secondary | ICD-10-CM | POA: Diagnosis not present

## 2023-01-24 DIAGNOSIS — E782 Mixed hyperlipidemia: Secondary | ICD-10-CM

## 2023-01-24 DIAGNOSIS — Z6841 Body Mass Index (BMI) 40.0 and over, adult: Secondary | ICD-10-CM

## 2023-01-24 DIAGNOSIS — E559 Vitamin D deficiency, unspecified: Secondary | ICD-10-CM

## 2023-01-24 DIAGNOSIS — Z0000 Encounter for general adult medical examination without abnormal findings: Secondary | ICD-10-CM

## 2023-01-24 DIAGNOSIS — N401 Enlarged prostate with lower urinary tract symptoms: Secondary | ICD-10-CM

## 2023-01-24 LAB — POCT GLYCOSYLATED HEMOGLOBIN (HGB A1C): Hemoglobin A1C: 5.3 % (ref 4.0–5.6)

## 2023-01-24 NOTE — Patient Instructions (Addendum)
 Thank you for coming to the office today.  Keep on current medications and plan per Cardiology No major changes today   DUE for FASTING BLOOD WORK (no food or drink after midnight before the lab appointment, only water or coffee without cream/sugar on the morning of)  SCHEDULE Lab Only visit in the morning at the clinic for lab draw in 6 MONTHS   - Make sure Lab Only appointment is at about 1 week before your next appointment, so that results will be available  For Lab Results, once available within 2-3 days of blood draw, you can can log in to MyChart online to view your results and a brief explanation. Also, we can discuss results at next follow-up visit.   Please schedule a Follow-up Appointment to: Return for 6 month fasting lab > 1 week later Annual Physical.  If you have any other questions or concerns, please feel free to call the office or send a message through MyChart. You may also schedule an earlier appointment if necessary.  Additionally, you may be receiving a survey about your experience at our office within a few days to 1 week by e-mail or mail. We value your feedback.  Marsa Officer, DO [facility], [place]

## 2023-01-24 NOTE — Progress Notes (Signed)
 Subjective:    Patient ID: [name], male    DOB: Apr 24, 1955, 68 y.o.   MRN: [id number]  [name] is a 68 y.o. male presenting on 01/24/2023 for Medical Management of Chronic Issues   HPI  Discussed the use of AI scribe software for clinical note transcription with the patient, who gave verbal consent to proceed.  History of Present Illness        Left Toe Tingling, Great toe Some bothersome feeling tingling, pins and needles paresthesia at times.  Falls x 3 in past 6 months Accidental fall while holding hand of grandson, he injured R knee Also 2 more falls accidental due to his R knee while going up steps while carrying something Right knee arthritis, contusion injury, has seen ED 10/2022, and Dr Marcelino s/p drainage of fluid.  Atrial Fibrillation Followed by Cardiology at [facility] Followed by Delon Finder NP Previous electrocardioversion without success with [facility] in past. Now referred to [facility] S/p cardiac ablation, successful.  He has come off cardiac medication Ranexa  now and also coming off Amiodarone  in Feb 2025 HR can fluctuate with activity Has home device to check EKG  Iron  Deficiency Anemia Followed by Hematology Dr Rennie S/p Iron  infusions, and on fast absorbing Iron  daily  Morbid Obesity BMI >47 Weight was down to 333 lbs, now back up to 348 lbs.  Followed by Dr Ebbie Glenn Rheumatology On Cymbalta  60mg  daily  L Shoulder Rotator Cuff Tear Followed by Orthopedic Dr Tobie  Prior reconstruction was unsuccessful Upcoming L Shoulder reverse replacement Feb 2025.     Health Maintenance: Decline Pneumonia vaccine Consider TDap next visit     01/24/2023    8:24 AM 08/02/2022    1:23 PM 08/01/2022    1:22 PM  Depression screen PHQ 2/9  Decreased Interest 1 0 1  Down, Depressed, Hopeless 0 0 0  PHQ - 2 Score 1 0 1  Altered sleeping 3  3  Tired, decreased energy 2  2  Change in appetite 1  3  Feeling bad or failure about yourself  0   1  Trouble concentrating 1  1  Moving slowly or fidgety/restless 0  0  Suicidal thoughts 0  0  PHQ-9 Score 8  11       01/24/2023    8:24 AM 08/01/2022    1:22 PM 02/03/2022    3:22 PM 11/28/2021    1:24 PM  GAD 7 : Generalized Anxiety Score  Nervous, Anxious, on Edge 1 1 2 2   Control/stop worrying 0 0 1 1  Worry too much - different things 0 1 1 1   Trouble relaxing 1 0 1 1  Restless 0 0  1  Easily annoyed or irritable 1 1 2 1   Afraid - awful might happen 0 0 0 0  Total GAD 7 Score 3 3  7   Anxiety Difficulty    Somewhat difficult    Social History   Tobacco Use   Smoking status: Former    Current packs/day: 0.00    Average packs/day: 1 pack/day for 10.0 years (10.0 ttl pk-yrs)    Types: Cigarettes    Start date: 02/17/1969    Quit date: 02/18/1979    Years since quitting: 43.9   Smokeless tobacco: Former    Types: Snuff  Vaping Use   Vaping status: Never Used  Substance Use Topics   Alcohol  use: Not Currently    Comment: last drink in 2021   Drug use:  No    Review of Systems Per HPI unless specifically indicated above     Objective:    BP (!) 140/88 (BP Location: Left Arm, Cuff Size: Normal)   Pulse 71   Ht 6' (1.829 m)   Wt (!) 348 lb (157.9 kg)   SpO2 97%   BMI 47.20 kg/m   Wt Readings from Last 3 Encounters:  01/24/23 (!) 348 lb (157.9 kg)  01/08/23 (!) 347 lb (157.4 kg)  11/09/22 (!) 337 lb (152.9 kg)    Physical Exam Vitals and nursing note reviewed.  Constitutional:      General: He is not in acute distress.    Appearance: He is well-developed. He is obese. He is not diaphoretic.     Comments: Well-appearing, comfortable, cooperative  HENT:     Head: Normocephalic and atraumatic.  Eyes:     General:        Right eye: No discharge.        Left eye: No discharge.     Conjunctiva/sclera: Conjunctivae normal.  Neck:     Thyroid : No thyromegaly.  Cardiovascular:     Rate and Rhythm: Normal rate and regular rhythm.     Pulses: Normal pulses.      Heart sounds: Normal heart sounds. No murmur heard. Pulmonary:     Effort: Pulmonary effort is normal. No respiratory distress.     Breath sounds: Normal breath sounds. No wheezing or rales.  Musculoskeletal:        General: Normal range of motion.     Cervical back: Normal range of motion and neck supple.  Lymphadenopathy:     Cervical: No cervical adenopathy.  Skin:    General: Skin is warm and dry.     Findings: No erythema or rash.  Neurological:     Mental Status: He is alert and oriented to person, place, and time. Mental status is at baseline.  Psychiatric:        Behavior: Behavior normal.     Comments: Well groomed, good eye contact, normal speech and thoughts     Results for orders placed or performed in visit on 01/24/23  POCT HgB A1C   Collection Time: 01/24/23  8:36 AM  Result Value Ref Range   Hemoglobin A1C 5.3 4.0 - 5.6 %   HbA1c POC (<> result, manual entry)     HbA1c, POC (prediabetic range)     HbA1c, POC (controlled diabetic range)        Assessment & Plan:   Problem List Items Addressed This Visit     Essential hypertension   Morbid obesity with BMI of 45.0-49.9, adult (HCC)   Pernicious anemia   Persistent atrial fibrillation (HCC)   Vitamin D  deficiency   Other Visit Diagnoses       Abnormal glucose    -  Primary   Relevant Orders   POCT HgB A1C (Completed)        Left foot paresthesia Reports intermittent tingling sensation in the left big toe, described as feeling asleep. No associated coldness or numbness. Suspected nerve pinching or compression since localized only 1 toe -Consider referral to a neurologist if symptoms persist or worsen.  Falls Reports three falls since last visit, two of which were due to instability from right knee inflammation following an initial fall. -Encourage safety measures at home to prevent further falls. Already working with Orthopedics  Atrial fibrillation s/p ablation Followed by [facility]  Cardiology Successful pulse field heart ablation performed in October. Gradual  weaning off heart medications, with last medication (amiodarone ) to be discontinued in February. -Continue current plan as directed by cardiologist.  Anemia Undergoing iron  infusions with significant improvement reported. Scheduled for follow-up and possible additional infusion in February. -Continue current treatment plan as directed by hematologist.  Upcoming Left Shoulder Surgery Reverse replacement scheduled for February 4th due to previous reconstruction failure. -Preoperative clearance as needed.  Prediabetes - resolved Latest A1C at 5.3, down from previous readings of 5.7. -Continue current lifestyle modifications and monitor A1C regularly.  Hypertension Recent blood pressure readings slightly elevated, but patient reports adherence to medication regimen. -Continue current antihypertensive medications and monitor blood pressure regularly.  General Health Maintenance -Schedule physical exam in July 2025. -Consider tetanus vaccine update during July visit.         Orders Placed This Encounter  Procedures   POCT HgB A1C    No orders of the defined types were placed in this encounter.   Follow up plan: Return for 6 month fasting lab > 1 week later Annual Physical.  Future labs ordered for 07/24/23  Marsa Officer, DO [facility] 01/24/2023, 8:41 AM

## 2023-02-05 ENCOUNTER — Other Ambulatory Visit: Payer: Self-pay | Admitting: Orthopedic Surgery

## 2023-02-07 ENCOUNTER — Other Ambulatory Visit: Payer: Self-pay | Admitting: Family Medicine

## 2023-02-07 ENCOUNTER — Telehealth: Payer: Self-pay

## 2023-02-07 DIAGNOSIS — M5136 Other intervertebral disc degeneration, lumbar region with discogenic back pain only: Secondary | ICD-10-CM

## 2023-02-07 DIAGNOSIS — M62830 Muscle spasm of back: Secondary | ICD-10-CM

## 2023-02-07 MED ORDER — ZEPBOUND 2.5 MG/0.5ML ~~LOC~~ SOAJ: 2.5000 mg | SUBCUTANEOUS | 0 refills | Status: DC

## 2023-02-07 NOTE — Telephone Encounter (Signed)
Unfortunately, there are not great options for oral weight loss medicines.  The best option is Contrave, this is a brand name Rx, dose increases over 1 month, we have free 7 day sample trial if interested once daily with meal for 7 days. It reduces cravings and helps weight loss overall.  It costs approx $99 per month even if insurance covers a portion or if it is not covered at all, still may be $99.  Other than this the older weight loss meds are not safe, so I don't use them very often such as Phentermine.  Then it is down to the Injections as we mentioned in last message.  Saralyn Pilar, DO [facility] 02/07/2023, 2:15 PM

## 2023-02-07 NOTE — Telephone Encounter (Signed)
I just entered info for Contrave for him and checked his other medications, unfortunately half of the active med within Contrave is "Naltrexone" which is an opioid antagonist. It will reduce cravings and block receptors, basically it will interfere with his Pain Medication - Hydrocodone. It may reduce the effectiveness of Hydrocodone or cause him to have some withdrawal symptoms.  So if he is actively on pain management, he cannot take the Contrave.  Sorry I did not catch this until I went to consider the prescription and test for interactions. My apologies!  I think we are back to compounding pharmacy such as [facility] for the injectable Semaglutide or Tirzepatide are the only options if he cannot find any coverage for Wegovy or Zepbound.  Out of pocket cost is $200-300+ depending on which one.  Saralyn Pilar, DO [facility] 02/07/2023, 2:37 PM

## 2023-02-07 NOTE — Addendum Note (Signed)
Addended by: Smitty Cords on: 02/07/2023 04:41 PM   Modules accepted: Orders

## 2023-02-07 NOTE — Telephone Encounter (Signed)
Requested medication (s) are due for refill today: yes  Requested medication (s) are on the active medication list: yes  Last refill:  03/20/22 #90/3  Future visit scheduled: yes  Notes to clinic:  Unable to refill per protocol, cannot delegate.    Requested Prescriptions  Pending Prescriptions Disp Refills   cyclobenzaprine (FLEXERIL) 10 MG tablet [Pharmacy Med Name: CYCLOBENZAPRINE 10 MG TAB] 90 tablet 3    Sig: TAKE ONE TABLET BY MOUTH THREE TIMES A DAY AS NEEDED FOR MUSCLE SPASMS     Not Delegated - Analgesics:  Muscle Relaxants Failed - 02/07/2023  5:17 PM      Failed - This refill cannot be delegated      Passed - Valid encounter within last 6 months    Recent Outpatient Visits           2 weeks ago Abnormal glucose   [place] [facility], Netta Neat, DO   6 months ago Annual physical exam   [place] [facility] Smitty Cords, DO   11 months ago Persistent atrial fibrillation [facility])   [place] [facility] Smitty Cords, DO   1 year ago Left inguinal pain   [place] [facility] Smitty Cords, DO   1 year ago Anxiety   [place] [facility] Smitty Cords, DO       Future Appointments             In 5 months Althea Charon, Netta Neat, DO [place] [facility]

## 2023-02-07 NOTE — Telephone Encounter (Signed)
Could you follow back up with a call to patient to clarify which medicine?  He does not have Type 2 Diabetes, last A1c 5.7. His vitals were wt 348 lbs and BMI >47  He would possibly qualify for Wegovy vs Zepbound for weight management.  Insurance often says "coverage" for Ozempic, but that is for "diabetic patients only", and he does not qualify.  So I am a bit unsure, I don't want to order something that is not covered.  I usually ask patients to check with the following:  Zepbound TO Check Cost & Coverage of Zepbound Please contact Research officer, trade union (manufacturer for [employer]) 1-800-[employer]illy[employer] [phone]) - Live agent to discuss cost and coverage.  [employer] I don't have the phone # but on the main wegovy site, they have a "Get Verified" check coverage option.  Saralyn Pilar, DO [facility] 02/07/2023, 1:14 PM

## 2023-02-07 NOTE — Telephone Encounter (Signed)
Copied from CRM [id number]. Topic: General - Other >> Feb 07, 2023 12:34 PM [place] B wrote: Reason for CRM: pt called in , had appt last week and discussed using ozempic. He couldn't afford it then, but now his insurance will cover most of the cost so he wants Dr Kirtland Bouchard to go ahead and write the rx for it

## 2023-02-08 ENCOUNTER — Ambulatory Visit: Payer: Medicare Other

## 2023-02-09 ENCOUNTER — Telehealth: Payer: Self-pay | Admitting: Internal Medicine

## 2023-02-09 NOTE — Telephone Encounter (Signed)
  Patient c/o Palpitations:  STAT if patient reporting lightheadedness, shortness of breath, or chest pain  How long have you had palpitations/irregular HR/ Afib? Are you having the symptoms now? Yes   Are you currently experiencing lightheadedness, SOB or CP? No   Do you have a history of afib (atrial fibrillation) or irregular heart rhythm? Ye   Have you checked your BP or HR? (document readings if available): HR - 111 4:05 pm  recent 64 - 68  Are you experiencing any other symptoms?   Pt said, his watch his in afib. He doesn't have symptoms but he had an ablation and his afraid he is back in afib again. He made an appt with Dr. Graciela Husbands on 02/15/23

## 2023-02-12 ENCOUNTER — Encounter
Admission: RE | Admit: 2023-02-12 | Discharge: 2023-02-12 | Disposition: A | Discharge status: Home / Self Care | Payer: Medicare Other | Source: Ambulatory Visit | Attending: Orthopedic Surgery | Admitting: Orthopedic Surgery

## 2023-02-12 ENCOUNTER — Telehealth: Payer: Self-pay

## 2023-02-12 VITALS — BP 129/87 | HR 69 | Resp 14 | Ht 72.0 in | Wt 351.9 lb

## 2023-02-12 DIAGNOSIS — Z01818 Encounter for other preprocedural examination: Secondary | ICD-10-CM | POA: Diagnosis not present

## 2023-02-12 DIAGNOSIS — Z01812 Encounter for preprocedural laboratory examination: Secondary | ICD-10-CM | POA: Diagnosis present

## 2023-02-12 DIAGNOSIS — Z01810 Encounter for preprocedural cardiovascular examination: Secondary | ICD-10-CM | POA: Diagnosis not present

## 2023-02-12 HISTORY — DX: Personal history of nicotine dependence: Z87.891

## 2023-02-12 HISTORY — DX: Morbid (severe) obesity due to excess calories: E66.01

## 2023-02-12 HISTORY — DX: Disorder of bone, unspecified: M89.9

## 2023-02-12 HISTORY — DX: Unspecified rotator cuff tear or rupture of left shoulder, not specified as traumatic: M75.102

## 2023-02-12 HISTORY — DX: Repeated falls: R29.6

## 2023-02-12 LAB — URINALYSIS, ROUTINE W REFLEX MICROSCOPIC
Bilirubin Urine: NEGATIVE
Glucose, UA: NEGATIVE mg/dL
Hgb urine dipstick: NEGATIVE
Ketones, ur: NEGATIVE mg/dL
Leukocytes,Ua: NEGATIVE
Nitrite: NEGATIVE
Protein, ur: NEGATIVE mg/dL
Specific Gravity, Urine: 1.014 (ref 1.005–1.030)
pH: 5 (ref 5.0–8.0)

## 2023-02-12 LAB — CBC WITH DIFFERENTIAL/PLATELET
Abs Immature Granulocytes: 0.02 10*3/uL (ref 0.00–0.07)
Basophils Absolute: 0 10*3/uL (ref 0.0–0.1)
Basophils Relative: 1 %
Eosinophils Absolute: 0.1 10*3/uL (ref 0.0–0.5)
Eosinophils Relative: 1 %
HCT: 43.4 % (ref 39.0–52.0)
Hemoglobin: 14.4 g/dL (ref 13.0–17.0)
Immature Granulocytes: 0 %
Lymphocytes Relative: 30 %
Lymphs Abs: 2 10*3/uL (ref 0.7–4.0)
MCH: 29.2 pg (ref 26.0–34.0)
MCHC: 33.2 g/dL (ref 30.0–36.0)
MCV: 88 fL (ref 80.0–100.0)
Monocytes Absolute: 0.8 10*3/uL (ref 0.1–1.0)
Monocytes Relative: 11 %
Neutro Abs: 3.7 10*3/uL (ref 1.7–7.7)
Neutrophils Relative %: 57 %
Platelets: 201 10*3/uL (ref 150–400)
RBC: 4.93 MIL/uL (ref 4.22–5.81)
RDW: 13.2 % (ref 11.5–15.5)
WBC: 6.6 10*3/uL (ref 4.0–10.5)
nRBC: 0 % (ref 0.0–0.2)

## 2023-02-12 LAB — COMPREHENSIVE METABOLIC PANEL
ALT: 15 U/L (ref 0–44)
AST: 18 U/L (ref 15–41)
Albumin: 4.2 g/dL (ref 3.5–5.0)
Alkaline Phosphatase: 71 U/L (ref 38–126)
Anion gap: 9 (ref 5–15)
BUN: 13 mg/dL (ref 8–23)
CO2: 24 mmol/L (ref 22–32)
Calcium: 8.8 mg/dL — ABNORMAL LOW (ref 8.9–10.3)
Chloride: 106 mmol/L (ref 98–111)
Creatinine, Ser: 0.95 mg/dL (ref 0.61–1.24)
GFR, Estimated: >60 mL/min (ref >60)
Glucose, Bld: 85 mg/dL (ref 70–99)
Potassium: 4.5 mmol/L (ref 3.5–5.1)
Sodium: 139 mmol/L (ref 135–145)
Total Bilirubin: 0.6 mg/dL (ref 0.0–1.2)
Total Protein: 6.6 g/dL (ref 6.5–8.1)

## 2023-02-12 LAB — SURGICAL PCR SCREEN
MRSA, PCR: NEGATIVE
Staphylococcus aureus: NEGATIVE

## 2023-02-12 NOTE — Telephone Encounter (Signed)
LM to return call.

## 2023-02-12 NOTE — Telephone Encounter (Signed)
Called patient, he states his device at home has only read afib a few times but nothing consistent, no complaints over the weekend. He states his HR is normal. No symptoms to report. He does have an appointment scheduled with Dr.Klein on 01/30. He is continuing his medications (amiodarone and xarelto). He is aware to call back with any symptoms or changes, patient aware to bring his device with him for Dr.Klein to review the readings he was receiving at home. Patient verbalized understanding.   Will route to MD just as FYI for upcoming appointment.

## 2023-02-12 NOTE — Telephone Encounter (Signed)
Pt is returning Amanda's call. Please advise

## 2023-02-12 NOTE — Telephone Encounter (Signed)
Who in the world are you???  I thought the famous Darene Lamer was not coming back until February.  Are you an impostor

## 2023-02-12 NOTE — Patient Instructions (Addendum)
Your procedure is scheduled on:02-20-23 Tuesday Report to the Registration Desk on the 1st floor of the [facility].Then proceed to the 2nd floor Surgery Desk To find out your arrival time, please call [phone] between 1PM - 3PM on:[birth year] Monday If your arrival time is 6:00 am, do not arrive before that time as the [facility] entrance doors do not open until 6:00 am.  REMEMBER: Instructions that are not followed completely may result in serious medical risk, up to and including death; or upon the discretion of your surgeon and anesthesiologist your surgery may need to be rescheduled.  Do not eat food after midnight the night before surgery.  No gum chewing or hard candies.  You may however, drink CLEAR liquids up to 2 hours before you are scheduled to arrive for your surgery. Do not drink anything within 2 hours of your scheduled arrival time.  Clear liquids include: - water  - apple juice without pulp - gatorade (not RED colors) - black coffee or tea (Do NOT add milk or creamers to the coffee or tea) Do NOT drink anything that is not on this list.  In addition, your doctor has ordered for you to drink the provided:  Ensure Pre-Surgery Clear Carbohydrate Drink  Drinking this carbohydrate drink up to two hours before surgery helps to reduce insulin resistance and improve patient outcomes. Please complete drinking 2 hours before scheduled arrival time.  One week prior to surgery:Stop NOW (02-12-23) Stop Anti-inflammatories (NSAIDS) such as Advil, Aleve, Ibuprofen, Motrin, Naproxen, Naprosyn and Aspirin based products such as Excedrin, Goody's Powder, BC Powder. Stop ANY OVER THE COUNTER supplements until after surgery Claudean Severance Iron Bisglycinate 25 mg Gentle on The Stomach Supports Energy & Healthy Red Blood Cell Production, Melatonin, Multivitamin)  You may however, continue to take Tylenol/Hydrocodone if needed for pain up until the day of surgery.  Stop ZEPBOUND 7 days prior to  surgery-Do NOT take again until AFTER surgery  Stop rivaroxaban (XARELTO) 3 days prior to surgery-Last dose will be on 02-16-23 Friday  Continue taking all of your other prescription medications up until the day of surgery.  ON THE DAY OF SURGERY ONLY TAKE THESE MEDICATIONS WITH SIPS OF WATER: -amiodarone (PACERONE)  -DULoxetine (CYMBALTA)  -busPIRone (BUSPAR) for anxiety  No Alcohol for 24 hours before or after surgery.  No Smoking including e-cigarettes for 24 hours before surgery.  No chewable tobacco products for at least 6 hours before surgery.  No nicotine patches on the day of surgery.  Do not use any "recreational" drugs for at least a week (preferably 2 weeks) before your surgery.  Please be advised that the combination of cocaine and anesthesia may have negative outcomes, up to and including death. If you test positive for cocaine, your surgery will be cancelled.  On the morning of surgery brush your teeth with toothpaste and water, you may rinse your mouth with mouthwash if you wish. Do not swallow any toothpaste or mouthwash.  Use CHG Soap as directed on instruction sheet.  Do not wear jewelry, make-up, hairpins, clips or nail polish.  For welded (permanent) jewelry: bracelets, anklets, waist bands, etc.  Please have this removed prior to surgery.  If it is not removed, there is a chance that hospital personnel will need to cut it off on the day of surgery.  Do not wear lotions, powders, or perfumes.   Do not shave body hair from the neck down 48 hours before surgery.  Contact lenses, hearing aids and dentures  may not be worn into surgery.  Do not bring valuables to the hospital. [facility] is not responsible for any missing/lost belongings or valuables.   Notify your doctor if there is any change in your medical condition (cold, fever, infection).  Wear comfortable clothing (specific to your surgery type) to the hospital.  After surgery, you can help prevent  lung complications by doing breathing exercises.  Take deep breaths and cough every 1-2 hours. Your doctor may order a device called an Incentive Spirometer to help you take deep breaths. When coughing or sneezing, hold a pillow firmly against your incision with both hands. This is called "splinting." Doing this helps protect your incision. It also decreases belly discomfort.  If you are being admitted to the hospital overnight, leave your suitcase in the car. After surgery it may be brought to your room.  In case of increased patient census, it may be necessary for you, the patient, to continue your postoperative care in the Same Day Surgery department.  If you are being discharged the day of surgery, you will not be allowed to drive home. You will need a responsible individual to drive you home and stay with you for 24 hours after surgery.   If you are taking public transportation, you will need to have a responsible individual with you.  Please call the Pre-admissions Testing Dept. at [phone] if you have any questions about these instructions.  Surgery Visitation Policy:  Patients having surgery or a procedure may have two visitors.  Children under the age of 96 must have an adult with them who is not the patient.  Temporary Visitor Restrictions Due to increasing cases of flu, RSV and COVID-19: Children ages 12 and under will not be able to visit patients in [facility] hospitals under most circumstances.  Inpatient Visitation:    Visiting hours are 7 a.m. to 8 p.m. Up to four visitors are allowed at one time in a patient room. The visitors may rotate out with other people during the day.  One visitor age 31 or older may stay with the patient overnight and must be in the room by 8 p.m.    Pre-operative 5 CHG Bath Instructions   You can play a key role in reducing the risk of infection after surgery. Your skin needs to be as free of germs as possible. You can reduce the  number of germs on your skin by washing with CHG (chlorhexidine gluconate) soap before surgery. CHG is an antiseptic soap that kills germs and continues to kill germs even after washing.   DO NOT use if you have an allergy to chlorhexidine/CHG or antibacterial soaps. If your skin becomes reddened or irritated, stop using the CHG and notify one of our RNs at [phone].   Please shower with the CHG soap starting 4 days before surgery using the following schedule:     Please keep in mind the following:  DO NOT shave, including legs and underarms, starting the day of your first shower.   You may shave your face at any point before/day of surgery.  Place clean sheets on your bed the day you start using CHG soap. Use a clean washcloth (not used since being washed) for each shower. DO NOT sleep with pets once you start using the CHG.   CHG Shower Instructions:  If you choose to wash your hair and private area, wash first with your normal shampoo/soap.  After you use shampoo/soap, rinse your hair and body  thoroughly to remove shampoo/soap residue.  Turn the water OFF and apply about 3 tablespoons (45 ml) of CHG soap to a CLEAN washcloth.  Apply CHG soap ONLY FROM YOUR NECK DOWN TO YOUR TOES (washing for 3-5 minutes)  DO NOT use CHG soap on face, private areas, open wounds, or sores.  Pay special attention to the area where your surgery is being performed.  If you are having back surgery, having someone wash your back for you may be helpful. Wait 2 minutes after CHG soap is applied, then you may rinse off the CHG soap.  Pat dry with a clean towel  Put on clean clothes/pajamas   If you choose to wear lotion, please use ONLY the CHG-compatible lotions on the back of this paper.     Additional instructions for the day of surgery: DO NOT APPLY any lotions, deodorants, cologne, or perfumes.   Put on clean/comfortable clothes.  Brush your teeth.  Ask your nurse before applying any prescription  medications to the skin.      CHG Compatible Lotions   Aveeno Moisturizing lotion  Cetaphil Moisturizing Cream  Cetaphil Moisturizing Lotion  Clairol Herbal Essence Moisturizing Lotion, Dry Skin  Clairol Herbal Essence Moisturizing Lotion, Extra Dry Skin  Clairol Herbal Essence Moisturizing Lotion, Normal Skin  Curel Age Defying Therapeutic Moisturizing Lotion with Alpha Hydroxy  Curel Extreme Care Body Lotion  Curel Soothing Hands Moisturizing Hand Lotion  Curel Therapeutic Moisturizing Cream, Fragrance-Free  Curel Therapeutic Moisturizing Lotion, Fragrance-Free  Curel Therapeutic Moisturizing Lotion, Original Formula  Eucerin Daily Replenishing Lotion  Eucerin Dry Skin Therapy Plus Alpha Hydroxy Crme  Eucerin Dry Skin Therapy Plus Alpha Hydroxy Lotion  Eucerin Original Crme  Eucerin Original Lotion  Eucerin Plus Crme Eucerin Plus Lotion  Eucerin TriLipid Replenishing Lotion  Keri Anti-Bacterial Hand Lotion  Keri Deep Conditioning Original Lotion Dry Skin Formula Softly Scented  Keri Deep Conditioning Original Lotion, Fragrance Free Sensitive Skin Formula  Keri Lotion Fast Absorbing Fragrance Free Sensitive Skin Formula  Keri Lotion Fast Absorbing Softly Scented Dry Skin Formula  Keri Original Lotion  Keri Skin Renewal Lotion Keri Silky Smooth Lotion  Keri Silky Smooth Sensitive Skin Lotion  Nivea Body Creamy Conditioning Oil  Nivea Body Extra Enriched Lotion  Nivea Body Original Lotion  Nivea Body Sheer Moisturizing Lotion Nivea Crme  Nivea Skin Firming Lotion  NutraDerm 30 Skin Lotion  NutraDerm Skin Lotion  NutraDerm Therapeutic Skin Cream  NutraDerm Therapeutic Skin Lotion  ProShield Protective Hand Cream  Provon moisturizing lotion  Preparing for Total Shoulder Arthroplasty  Before surgery, you can play an important role by reducing the number of germs on your skin by using the following products:  Benzoyl Peroxide Gel  o Reduces the number of germs  present on the skin  o Applied twice a day to shoulder area starting two days before surgery  Chlorhexidine Gluconate (CHG) Soap  o An antiseptic cleaner that kills germs and bonds with the skin to continue killing germs even after washing  o Used for showering the night before surgery and morning of surgery  BENZOYL PEROXIDE 5% GEL  Please do not use if you have an allergy to benzoyl peroxide. If your skin becomes reddened/irritated stop using the benzoyl peroxide.  Starting two days before surgery, apply as follows:  1. Apply benzoyl peroxide in the morning and at night. Apply after taking a shower. If you are not taking a shower, clean entire shoulder front, back, and side along with the armpit with  a clean wet washcloth.  2. Place a quarter-sized dollop on your shoulder and rub in thoroughly, making sure to cover the front, back, and side of your shoulder, along with the armpit.  2 days before ____ AM ____ PM 1 day before ____ AM ____ PM  3. Do this twice a day for two days. (Last application is the night before surgery, AFTER using the CHG soap).  4. Do NOT apply benzoyl peroxide gel on the day of surgery.  How to Use an Incentive Spirometer An incentive spirometer is a tool that measures how well you are filling your lungs with each breath. Learning to take long, deep breaths using this tool can help you keep your lungs clear and active. This may help to reverse or lessen your chance of developing breathing (pulmonary) problems, especially infection. You may be asked to use a spirometer: After a surgery. If you have a lung problem or a history of smoking. After a long period of time when you have been unable to move or be active. If the spirometer includes an indicator to show the highest number that you have reached, your health care provider or respiratory therapist will help you set a goal. Keep a log of your progress as told by your health care provider. What are the  risks? Breathing too quickly may cause dizziness or cause you to pass out. Take your time so you do not get dizzy or light-headed. If you are in pain, you may need to take pain medicine before doing incentive spirometry. It is harder to take a deep breath if you are having pain. How to use your incentive spirometer  Sit up on the edge of your bed or on a chair. Hold the incentive spirometer so that it is in an upright position. Before you use the spirometer, breathe out normally. Place the mouthpiece in your mouth. Make sure your lips are closed tightly around it. Breathe in slowly and as deeply as you can through your mouth, causing the piston or the ball to rise toward the top of the chamber. Hold your breath for 3-5 seconds, or for as long as possible. If the spirometer includes a coach indicator, use this to guide you in breathing. Slow down your breathing if the indicator goes above the marked areas. Remove the mouthpiece from your mouth and breathe out normally. The piston or ball will return to the bottom of the chamber. Rest for a few seconds, then repeat the steps 10 or more times. Take your time and take a few normal breaths between deep breaths so that you do not get dizzy or light-headed. Do this every 1-2 hours when you are awake. If the spirometer includes a goal marker to show the highest number you have reached (best effort), use this as a goal to work toward during each repetition. After each set of 10 deep breaths, cough a few times. This will help to make sure that your lungs are clear. If you have an incision on your chest or abdomen from surgery, place a pillow or a rolled-up towel firmly against the incision when you cough. This can help to reduce pain while taking deep breaths and coughing. General tips When you are able to get out of bed: Walk around often. Continue to take deep breaths and cough in order to clear your lungs. Keep using the incentive spirometer until  your health care provider says it is okay to stop using it. If you have been in  the hospital, you may be told to keep using the spirometer at home. Contact a health care provider if: You are having difficulty using the spirometer. You have trouble using the spirometer as often as instructed. Your pain medicine is not giving enough relief for you to use the spirometer as told. You have a fever. Get help right away if: You develop shortness of breath. You develop a cough with bloody mucus from the lungs. You have fluid or blood coming from an incision site after you cough. Summary An incentive spirometer is a tool that can help you learn to take long, deep breaths to keep your lungs clear and active. You may be asked to use a spirometer after a surgery, if you have a lung problem or a history of smoking, or if you have been inactive for a long period of time. Use your incentive spirometer as instructed every 1-2 hours while you are awake. If you have an incision on your chest or abdomen, place a pillow or a rolled-up towel firmly against your incision when you cough. This will help to reduce pain. Get help right away if you have shortness of breath, you cough up bloody mucus, or blood comes from your incision when you cough. This information is not intended to replace advice given to you by your health care provider. Make sure you discuss any questions you have with your health care provider. Document Revised: 11/10/2022 Document Reviewed: 11/10/2022 Elsevier Patient Education  2024 [employer].

## 2023-02-12 NOTE — Telephone Encounter (Signed)
This is actually a duplicate question.  Telephone note 02/07/23 has the other information on this.  We tried to look into Contrave but he cannot take it due to his pain medication interaction.  We are left with injection options  Zepbound Wegovy  If insurance not covering, it would be out of pocket compounding pharmacy options  Semaglutide injection (mixed Ozempic) from [facility] 0.25mg  weekly for 4 weeks then increase to 0.5mg  weekly  It comes in a vial and a needle syringe, you need to draw up the shot and self admin it weekly Cost is about $200-300 per month Call them to check pricing and availability  Tirzepatide injection $400-500 per month  [facility] Address: [address] [place], [place], [place] [address] Phone: [phone]   [facility] Virtual Doctor Team With insurance $79/mo + copay for medications Without insurance $79/mo + $99/mo to include medication (Semaglutide) Without insurance $79/mo + $199/mo to include medication (Tirzepatide) [email or web address]

## 2023-02-13 ENCOUNTER — Ambulatory Visit
Payer: Medicare Other | Attending: Student in an Organized Health Care Education/Training Program | Admitting: Student in an Organized Health Care Education/Training Program

## 2023-02-13 ENCOUNTER — Encounter: Payer: Self-pay | Admitting: Student in an Organized Health Care Education/Training Program

## 2023-02-13 VITALS — BP 142/91 | HR 83 | Temp 98.2°F | Resp 16 | Ht 72.0 in | Wt 351.0 lb

## 2023-02-13 DIAGNOSIS — G894 Chronic pain syndrome: Secondary | ICD-10-CM | POA: Diagnosis not present

## 2023-02-13 DIAGNOSIS — M170 Bilateral primary osteoarthritis of knee: Secondary | ICD-10-CM | POA: Diagnosis not present

## 2023-02-13 DIAGNOSIS — M47816 Spondylosis without myelopathy or radiculopathy, lumbar region: Secondary | ICD-10-CM | POA: Diagnosis not present

## 2023-02-13 DIAGNOSIS — M65331 Trigger finger, right middle finger: Secondary | ICD-10-CM | POA: Diagnosis not present

## 2023-02-13 DIAGNOSIS — M19011 Primary osteoarthritis, right shoulder: Secondary | ICD-10-CM | POA: Diagnosis not present

## 2023-02-13 DIAGNOSIS — M65332 Trigger finger, left middle finger: Secondary | ICD-10-CM | POA: Insufficient documentation

## 2023-02-13 DIAGNOSIS — M19012 Primary osteoarthritis, left shoulder: Secondary | ICD-10-CM | POA: Diagnosis not present

## 2023-02-13 MED ORDER — HYDROCODONE-ACETAMINOPHEN 7.5-325 MG PO TABS: 1.0000 | ORAL_TABLET | Freq: Four times a day (QID) | ORAL | 0 refills | Status: AC | PRN

## 2023-02-13 NOTE — Patient Instructions (Addendum)
Stop Xarelto x 3 days prior to procedure.     Preparing for your procedure (without sedation) Instructions: Oral Intake: Do not eat or drink anything for at least 3 hours prior to your procedure. Transportation: Unless otherwise stated by your physician, you may drive yourself after the procedure. Blood Pressure Medicine: Take your blood pressure medicine with a sip of water the morning of the procedure. Insulin: Take only  of your normal insulin dose. Preventing infections: Shower with an antibacterial soap the morning of your procedure. Build-up your immune system: Take 1000 mg of Vitamin C with every meal (3 times a day) the day prior to your procedure. Pregnancy: If you are pregnant, call and cancel the procedure. Sickness: If you have a cold, fever, or any active infections, call and cancel the procedure. Arrival: You must be in the facility at least 30 minutes prior to your scheduled procedure. Children: Do not bring any children with you. Dress appropriately: Bring dark clothing that you would not mind if they get stained. Valuables: Do not bring any jewelry or valuables. Procedure appointments are reserved for interventional treatments only. No Prescription Refills. No medication changes will be discussed during procedure appointments. No disability issues will be discussed. Radiofrequency Ablation Radiofrequency ablation is a procedure that is performed to relieve pain. The procedure is often used for back, neck, or arm pain. Radiofrequency ablation involves the use of a machine that creates radio waves to make heat. During the procedure, the heat is applied to the nerve that carries the pain signal. The heat damages the nerve and interferes with the pain signal. Pain relief usually starts about 2 weeks after the procedure and lasts for 6 months to 1 year. Tell a health care provider about: Any allergies you have. All medicines you are taking, including vitamins, herbs, eye  drops, creams, and over-the-counter medicines. Any problems you or family members have had with anesthetic medicines. Any bleeding problems you have. Any surgeries you have had. Any medical conditions you have. Whether you are pregnant or may be pregnant. What are the risks? Generally, this is a safe procedure. However, problems may occur, including: Pain or soreness at the injection site. Allergic reaction to medicines given during the procedure. Bleeding. Infection at the injection site. Damage to nerves or blood vessels. What happens before the procedure? When to stop eating and drinking Follow instructions from your health care provider about what you may eat and drink before your procedure. These may include: 8 hours before the procedure Stop eating most foods. Do not eat meat, fried foods, or fatty foods. Eat only light foods, such as toast or crackers. All liquids are okay except energy drinks and alcohol. 6 hours before the procedure Stop eating. Drink only clear liquids, such as water, clear fruit juice, black coffee, plain tea, and sports drinks. Do not drink energy drinks or alcohol. 2 hours before the procedure Stop drinking all liquids. You may be allowed to take medicine with small sips of water. If you do not follow your health care provider's instructions, your procedure may be delayed or canceled. Medicines Ask your health care provider about: Changing or stopping your regular medicines. This is especially important if you are taking diabetes medicines or blood thinners. Taking medicines such as aspirin and ibuprofen. These medicines can thin your blood. Do not take these medicines unless your health care provider tells you to take them. Taking over-the-counter medicines, vitamins, herbs, and supplements. General instructions Ask your health care provider  what steps will be taken to help prevent infection. These steps may include: Removing hair at the procedure  site. Washing skin with a germ-killing soap. Taking antibiotic medicine. If you will be going home right after the procedure, plan to have a responsible adult: Take you home from the hospital or clinic. You will not be allowed to drive. Care for you for the time you are told. What happens during the procedure?  You will be awake during the procedure. You will need to be able to talk with the health care provider during the procedure. An IV will be inserted into one of your veins. You will be given one or more of the following: A medicine to help you relax (sedative). A medicine to numb the area (local anesthetic). Your health care provider will insert a radiofrequency needle into the area to be treated. This is done with the help of fluoroscopy. A wire that carries the radio waves (electrode) will be put through the radiofrequency needle. An electrical pulse will be sent through the electrode to verify the correct nerve that is causing your pain. You will feel a tingling sensation, and you may have muscle twitching. The tissue around the needle tip will be heated by an electric current that comes from the radiofrequency machine. This will numb the nerves. The needle will be removed. A bandage (dressing) will be put on the insertion area. The procedure may vary among health care providers and hospitals. What happens after the procedure? Your blood pressure, heart rate, breathing rate, and blood oxygen level will be monitored until you leave the hospital or clinic. Return to your normal activities as told by your health care provider. Ask your health care provider what activities are safe for you. If you were given a sedative during the procedure, it can affect you for several hours. Do not drive or operate machinery until your health care provider says that it is safe. Summary Radiofrequency ablation is a procedure that is performed to relieve pain. The procedure is often used for back, neck,  or arm pain. Radiofrequency ablation involves the use of a machine that creates radio waves to make heat. Plan to have a responsible adult take you home from the hospital or clinic. Do not drive or operate machinery until your health care provider says that it is safe. Return to your normal activities as told by your health care provider. Ask your health care provider what activities are safe for you. This information is not intended to replace advice given to you by your health care provider. Make sure you discuss any questions you have with your health care provider. Document Revised: 06/22/2020 Document Reviewed: 06/22/2020 Elsevier Patient Education  2024 [employer].

## 2023-02-13 NOTE — Telephone Encounter (Signed)
Spoke to patient. He has called [facility] and they are to be faxing our office some information about a pill he can take but has to be prior authorized. We havent received any information on this.

## 2023-02-13 NOTE — Progress Notes (Signed)
Nursing Pain Medication Assessment:  Safety precautions to be maintained throughout the outpatient stay will include: orient to surroundings, keep bed in low position, maintain call bell within reach at all times, provide assistance with transfer out of bed and ambulation.   Medication Inspection Compliance: Pill count conducted under aseptic conditions, in front of the patient. Neither the pills nor the bottle was removed from the patient's sight at any time. Once count was completed pills were immediately returned to the patient in their original bottle.  Medication: Hydrocodone/APAP Pill/Patch Count:  9 of 120 pills remain Pill/Patch Appearance: Markings consistent with prescribed medication Bottle Appearance: Standard pharmacy container. Clearly labeled. Filled Date: 01 / 01 / 2025 Last Medication intake:  Today

## 2023-02-13 NOTE — Progress Notes (Signed)
PROVIDER NOTE: Information contained herein reflects review and annotations entered in association with encounter. Interpretation of such information and data should be left to medically-trained personnel. Information provided to patient can be located elsewhere in the medical record under "Patient Instructions". Document created using STT-dictation technology, any transcriptional errors that may result from process are unintentional.    Patient: [name]  Service Category: E/M  Provider: Edward Jolly, MD  DOB: 1955-06-18  DOS: 02/13/2023  Referring Provider: Saralyn Pilar *  MRN: [id number]  Specialty: Interventional Pain Management  PCP: Smitty Cords, DO  Type: Established Patient  Setting: Ambulatory outpatient    Location: Office  Delivery: Face-to-face     HPI  [name], a 68 y.o. year old male, is here today because of his Bilateral primary osteoarthritis of knee [M17.0]. [name] primary complain today is Back Pain and Knee Pain  Pertinent problems: [name] has H/O gastric bypass; DJD (degenerative joint disease) of knee; Bilateral carpal tunnel syndrome; Persistent atrial fibrillation (HCC); Chronic radicular lumbar pain; Lumbar radiculopathy; Lumbar degenerative disc disease; Lumbar facet arthropathy; Lumbar facet joint syndrome; Primary osteoarthritis of both wrists; Trigger middle finger of left hand; and Chronic pain syndrome on their pertinent problem list. Pain Assessment: Severity of Chronic pain is reported as a 6 /10. Location: Back Lower, Right, Left/Radiates to knee bilateral. Onset: More than a month ago. Quality: Aching, Constant (Grinding). Timing: Constant. Modifying factor(s): Pain meidcation and rest. Vitals:  height is 6' (1.829 m) and weight is 351 lb (159.2 kg) (abnormal). His temporal temperature is 98.2 F (36.8 C). His blood pressure is 142/91 (abnormal) and his pulse is 83. His respiration is 16 and oxygen saturation is 95%.  BMI:  Estimated body mass index is 47.6 kg/m as calculated from the following:   Height as of this encounter: 6' (1.829 m).   Weight as of this encounter: 351 lb (159.2 kg). Last encounter: 11/07/2022. Last procedure: 01/08/2023.  Reason for encounter: post-procedure evaluation and assessment.   Discussed the use of AI scribe software for clinical note transcription with the patient, who gave verbal consent to proceed.  History of Present Illness   The patient presents with knee pain and back pain for follow-up and management.  He has knee pain and received a knee injection during his last visit, which provided about 50% relief. The pain persists, and he is interested in having gel injections in both knees again, recalling that Unk Lightning was effective previously. His right knee was injured during a fall, leading him to favor his left knee, which has now started to act up. Despite this, he was able to walk from the parking lot to the clinic, although his heart rate increased slightly.  He describes significant back pain following three Hill last year, with two occurring in October and one in September. The current episode of back pain began last Tuesday night and has persisted, though it is now under control with pain medication. He experiences excruciating pain upon waking, which improves after taking his first dose of medication. Last week, the medication was not effective, but it has since started to help. He mentions having a good management schedule for his back pain previously, which reduced his need for pain medication.  He reports issues with his trigger finger, which has been acting up again. He has previously received a trigger point injection for this condition.  He is scheduled for a reverse shoulder replacement on the fourth of the upcoming month due  to a torn shoulder.  He mentions a history of heart issues, which have been addressed, allowing him to stop taking Xarelto.        Pharmacotherapy Assessment  Analgesic: hydrocodone 7.5 mg 3 times daily as needed     Monitoring: [place] PMP: PDMP reviewed during this encounter.       Pharmacotherapy: No side-effects or adverse reactions reported. Compliance: No problems identified. Effectiveness: Clinically acceptable.  Earlyne Iba, RN  02/13/2023 11:11 AM  Sign when Signing Visit Nursing Pain Medication Assessment:  Safety precautions to be maintained throughout the outpatient stay will include: orient to surroundings, keep bed in low position, maintain call bell within reach at all times, provide assistance with transfer out of bed and ambulation.   Medication Inspection Compliance: Pill count conducted under aseptic conditions, in front of the patient. Neither the pills nor the bottle was removed from the patient's sight at any time. Once count was completed pills were immediately returned to the patient in their original bottle.  Medication: Hydrocodone/APAP Pill/Patch Count:  9 of 120 pills remain Pill/Patch Appearance: Markings consistent with prescribed medication Bottle Appearance: Standard pharmacy container. Clearly labeled. Filled Date: 01 / 01 / 2025 Last Medication intake:  Today  No results found for: "CBDTHCR" No results found for: "D8THCCBX" No results found for: "D9THCCBX"  UDS:  Summary  Date Value Ref Range Status  08/15/2022 Note  Final    Comment:    ==================================================================== [employer] [facility] 13 (MW) ==================================================================== Test                             Result       Flag       Units  Drug Present and Declared for Prescription Verification   Hydrocodone                    5111         EXPECTED   ng/mg creat   Hydromorphone                  68           EXPECTED   ng/mg creat   Dihydrocodeine                 525          EXPECTED   ng/mg creat   Norhydrocodone                 2358         EXPECTED    ng/mg creat    Sources of hydrocodone include scheduled prescription medications.    Hydromorphone, dihydrocodeine and norhydrocodone are expected    metabolites of hydrocodone. Hydromorphone and dihydrocodeine are    also available as scheduled prescription medications.  ==================================================================== Test                      Result    Flag   Units      Ref Range   Creatinine              137              mg/dL      >=40 ==================================================================== Declared Medications:  The flagging and interpretation on this report are based on the  following declared medications.  Unexpected results may arise from  inaccuracies in the declared medications.   **Note: The testing scope of this panel  includes these medications:   Hydrocodone (Norco)   **Note: The testing scope of this panel does not include the  following reported medications:   Acetaminophen (Norco)  Amiodarone  Benazepril (Lotensin)  Buspirone (Buspar)  Cyclobenzaprine (Flexeril)  Duloxetine (Cymbalta)  Eye Drop  Fluticasone (Flonase)  Iron  Melatonin  Metoprolol  Multivitamin  Rivaroxaban (Xarelto)  Trazodone  Vitamin B12  Vitamin C ==================================================================== For clinical consultation, please call [phone]. ====================================================================       ROS  Constitutional: Denies any fever or chills Gastrointestinal: No reported hemesis, hematochezia, vomiting, or acute GI distress Musculoskeletal:  Bilateral knee pain, trigger finger, low back pain Neurological: No reported episodes of acute onset apraxia, aphasia, dysarthria, agnosia, amnesia, paralysis, loss of coordination, or loss of consciousness  Medication Review  DULoxetine, Ferrous Bisglycinate Chelate, HYDROcodone-acetaminophen, amiodarone, benazepril, busPIRone, carboxymethylcellulose,  cyanocobalamin, cyclobenzaprine, fluticasone, melatonin, multivitamin with minerals, rivaroxaban, tirzepatide, and traZODone  History Review  Allergy: [name] is allergic to bupivacaine liposome. Drug: [name]  reports no history of drug use. Alcohol:  reports that he does not currently use alcohol. Tobacco:  reports that he quit smoking about 44 years ago. His smoking use included cigarettes. He started smoking about 54 years ago. He has a 10 pack-year smoking history. He has quit using smokeless tobacco.  His smokeless tobacco use included snuff. Social: Mr. Cafiero  reports that he quit smoking about 44 years ago. His smoking use included cigarettes. He started smoking about 54 years ago. He has a 10 pack-year smoking history. He has quit using smokeless tobacco.  His smokeless tobacco use included snuff. He reports that he does not currently use alcohol. He reports that he does not use drugs. Medical:  has a past medical history of Anxiety, Aortic atherosclerosis (HCC), Cardiomyopathy (in setting of Afib), Chronic pain syndrome, Chronic, continuous use of opioids, Coronary artery disease (11/06/2014), Diverticulosis, DJD (degenerative joint disease) of knee, Former smoker, History of hiatal hernia, History of kidney stones (2012), Hyperlipidemia, Hyperplastic colon polyp, Hypertension, Inguinal hernia, Internal hemorrhoids, Intervertebral disc disorder with radiculopathy of lumbosacral region, Lesion of bone of lumbosacral spine, Long term current use of amiodarone, Long term current use of antithrombotics/antiplatelets, Lower extremity edema, Mixed hyperlipidemia, Morbid obesity (HCC), Multiple Hill, Myalgia due to statin, Osteoarthritis, PAF (paroxysmal atrial fibrillation) (HCC), Pernicious anemia, Prostate cancer (HCC), Pulmonary nodule, right, Rotator cuff tear, left, Sleep difficulties, SVT (supraventricular tachycardia) (HCC), and Tubular adenoma of colon. Surgical: Mr. Ramus  has a past  surgical history that includes Knee surgery (Right); Gastric bypass (01/17/2000); prostate seeding; Carpal tunnel release (Right, 12/23/2014); Carpal tunnel release (Left, 01/06/2015); Colonoscopy with propofol (N/A, 10/08/2017); Cardioversion (N/A, 05/15/2019); Cardioversion (N/A, 03/13/2014); Reverse shoulder arthroplasty (Right, 09/27/2021); Bicept tenodesis (Right, 09/27/2021); Shoulder arthroscopy with rotator cuff repair and open biceps tenodesis (Left, 12/27/2021); Cardioversion (N/A, 03/24/2022); Cardioversion (N/A, 05/19/2022); Colonoscopy with propofol (N/A, 06/07/2022); Esophagogastroduodenoscopy (egd) with propofol (N/A, 06/07/2022); enteroscopy (06/07/2022); Cardioversion (N/A, 07/25/2022); and Cardioversion (N/A, 09/21/2022). Family: family history includes Arrhythmia in his brother and sister; Cancer in his brother, father, and sister; Heart disease in his father; Hypertension in his father; Stroke in his father.  Laboratory Chemistry Profile   Renal Lab Results  Component Value Date   BUN 13 02/12/2023   CREATININE 0.95 02/12/2023   BCR 12 08/31/2022   GFRAA 72 07/21/2020   GFRNONAA >60 02/12/2023    Hepatic Lab Results  Component Value Date   AST 18 02/12/2023   ALT 15 02/12/2023   ALBUMIN 4.2 02/12/2023  ALKPHOS 71 02/12/2023   LIPASE 28 03/13/2017    Electrolytes Lab Results  Component Value Date   NA 139 02/12/2023   K 4.5 02/12/2023   CL 106 02/12/2023   CALCIUM 8.8 (L) 02/12/2023   MG 2.0 02/01/2021    Bone Lab Results  Component Value Date   VD25OH 37.90 10/30/2022   25OHVITD1 32 02/01/2021   25OHVITD2 <1.0 02/01/2021   25OHVITD3 32 02/01/2021    Inflammation (CRP: Acute Phase) (ESR: Chronic Phase) Lab Results  Component Value Date   CRP 1.7 02/09/2022   ESRSEDRATE 6 02/09/2022         Note: Above Lab results reviewed.  Recent Imaging Review  DG Knee Complete 4 Views Right CLINICAL DATA:  Pain.  EXAM: RIGHT KNEE - COMPLETE 4+ VIEW  COMPARISON:  None  Available.  FINDINGS: No acute fracture or dislocation.  No aggressive osseous lesion.  There are degenerative changes of the knee joint in the form of moderately reduced tibiofemoral compartment joint space along with tricompartmental osteophytosis. There also changes of chondromalacia patellae. Note is made of meniscal chondrocalcinosis.  There is small-to-moderate suprapatellar knee joint effusion.  No focal soft tissue swelling.  No radiopaque foreign bodies.  IMPRESSION: *No acute osseous abnormality of the right knee joint. *Moderate degenerative osteoarthritic changes, as described above.  Electronically Signed   By: Jules Schick M.D.   On: 11/09/2022 17:21 Note: Reviewed        Physical Exam  General appearance: Well nourished, well developed, and well hydrated. In no apparent acute distress Mental status: Alert, oriented x 3 (person, place, & time)       Respiratory: No evidence of acute respiratory distress Eyes: PERLA Vitals: BP (!) 142/91   Pulse 83   Temp 98.2 F (36.8 C) (Temporal)   Resp 16   Ht 6' (1.829 m)   Wt (!) 351 lb (159.2 kg)   SpO2 95%   BMI 47.60 kg/m  BMI: Estimated body mass index is 47.6 kg/m as calculated from the following:   Height as of this encounter: 6' (1.829 m).   Weight as of this encounter: 351 lb (159.2 kg). Ideal: Ideal body weight: 77.6 kg (171 lb 1.2 oz) Adjusted ideal body weight: 110.2 kg (243 lb 0.7 oz)  Lumbar Spine Area Exam  Skin & Axial Inspection: No masses, redness, or swelling Alignment: Symmetrical Functional ROM: Pain restricted ROM       Stability: No instability detected Muscle Tone/Strength: Functionally intact. No obvious neuro-muscular anomalies detected. Sensory (Neurological): Musculoskeletal pain pattern Palpation: No palpable anomalies       Provocative Tests: Hyperextension/rotation test: (+) bilaterally for facet joint pain. Lumbar quadrant test (Kemp's test): (+) bilaterally for facet joint  pain.   Gait & Posture Assessment  Ambulation: Unassisted Gait: Relatively normal for age and body habitus Posture: WNL  Lower Extremity Exam      Side: Right lower extremity   Side: Left lower extremity  Stability: No instability observed           Stability: No instability observed          Skin & Extremity Inspection: Skin color, temperature, and hair growth are WNL. No peripheral edema or cyanosis. No masses, redness, swelling, asymmetry, or associated skin lesions. No contractures.   Skin & Extremity Inspection: Skin color, temperature, and hair growth are WNL. No peripheral edema or cyanosis. No masses, redness, swelling, asymmetry, or associated skin lesions. No contractures.  Functional ROM: Pain restricted ROM for hip  and knee joints           Functional ROM: Pain restricted ROM for hip and knee joints          Muscle Tone/Strength: Functionally intact. No obvious neuro-muscular anomalies detected.   Muscle Tone/Strength: Functionally intact. No obvious neuro-muscular anomalies detected.  Sensory (Neurological): Unimpaired         Sensory (Neurological): Unimpaired        DTR: Patellar: deferred today Achilles: deferred today Plantar: deferred today   DTR: Patellar: deferred today Achilles: deferred today Plantar: deferred today  Palpation: No palpable anomalies   Palpation: No palpable anomalies       Assessment   Diagnosis Status  1. Bilateral primary osteoarthritis of knee   2. Trigger middle finger of left hand   3. Trigger middle finger of right hand   4. Lumbar facet arthropathy   5. Lumbar facet joint syndrome   6. Chronic pain syndrome   7. Localized osteoarthritis of shoulder regions, bilateral    Controlled Controlled Controlled   Updated Problems: No problems updated.  Plan of Care  Problem-specific:  Assessment and Plan    Knee Osteoarthritis Reports approximately 50% relief from a previous knee injection but continues to experience bilateral knee  pain, worsened by recent Hill. Favoring the right knee has increased pain in the left knee. Gelsyn injections, which previously provided significant relief, were discussed. Risks include infection, bleeding, and temporary pain increase, while benefits include improved joint lubrication and pain relief. Administer Gelsyn injections in both knees  Chronic Back Pain Severe back pain, exacerbated by recent Hill, is controlled with medication but worsens in the morning. Previous radiofrequency ablation (RFA) provided significant relief. A repeat RFA was discussed, with risks including infection, nerve damage, and temporary pain increase. Benefits include long-term pain relief and reduced need for pain medications. Schedule RFA for back pain after surgical clearance.  Previous RFA was done right L3, L4, L5 on 01/02/2022 and on the left 02/16/2021.  These provided him with 75% pain relief for approximately 8 months.  Discussed repeating.  Trigger Finger Recurrent trigger finger symptoms have been noted. A previous trigger point injection provided relief, and a repeat injection was discussed. Risks include infection, bleeding, and temporary pain increase, with benefits of reduced pain and improved finger mobility. Administer trigger point injection for trigger finger after surgical clearance.    [name] has a current medication list which includes the following long-term medication(s): amiodarone, benazepril, duloxetine, ferrous bisglycinate chelate, fluticasone, xarelto, and trazodone.  Pharmacotherapy (Medications Ordered): Meds ordered this encounter  Medications   HYDROcodone-acetaminophen (NORCO) 7.5-325 MG tablet    Sig: Take 1 tablet by mouth every 6 (six) hours as needed for severe pain (pain score 7-10). Must last 30 days    Dispense:  120 tablet    Refill:  0    Chronic Pain: STOP Act (Not applicable) Fill 1 day early if closed on refill date. Avoid benzodiazepines within 8 hours of  opioids   HYDROcodone-acetaminophen (NORCO) 7.5-325 MG tablet    Sig: Take 1 tablet by mouth every 6 (six) hours as needed for severe pain (pain score 7-10). Must last 30 days    Dispense:  120 tablet    Refill:  0    Chronic Pain: STOP Act (Not applicable) Fill 1 day early if closed on refill date. Avoid benzodiazepines within 8 hours of opioids   HYDROcodone-acetaminophen (NORCO) 7.5-325 MG tablet    Sig: Take 1 tablet by mouth  every 6 (six) hours as needed for severe pain (pain score 7-10). Must last 30 days    Dispense:  120 tablet    Refill:  0    Chronic Pain: STOP Act (Not applicable) Fill 1 day early if closed on refill date. Avoid benzodiazepines within 8 hours of opioids   Orders:  Orders Placed This Encounter  Procedures   KNEE INJECTION    Indications: Knee arthralgia (pain) due to osteoarthritis (OA) Imaging: None (CPT-20610) Position: Sitting Equipment/Materials: Block tray  1.5", 25-G (one per side)  Local anesthetic  Monovisc (one per side) Confirm availability (in office) of Monovisc (HMW hyaluronan)    Standing Status:   Future    Expected Date:   02/27/2023    Expiration Date:   02/13/2024    Scheduling Instructions:     Procedure: Knee injection Monovisc (Hyaluronan/Hyaluronic acid)     Treatment No.: 3           Level: Intra-articular     Laterality: Bilateral Knee     Sedation: Patient's choice.    Where will this procedure be performed?:   [facility]   Injection tendon or ligament    Standing Status:   Future    Expiration Date:   05/14/2023    Scheduling Instructions:     Trigger finger injection bilateral   Radiofrequency,Lumbar    Standing Status:   Future    Expected Date:   03/16/2023    Expiration Date:   05/14/2023    Scheduling Instructions:     Side(s): Bilateral     Level(s):  L3, L4, L5, Medial Branch Nerve(s)     Sedation: With Sedation     Scheduling Timeframe: As soon as pre-approved    Where will this procedure be performed?:    [facility]   Follow-up plan:   Return in about 20 days (around 03/05/2023) for B/L knee gelsyn and trigger finger, in clinic NS.      s/p lesi #1 (left L4/5) on 8/17, #2 on 10/21/2018 (left L3/4), #3 on 12/09/2018 (Left L3/4); 02/24/2019: Right wrist injection and left trigger finger (middle finger) injection.,  Bilateral wrist injection 10/15/2019, bilateral knee Hyalgan No. 1 01/26/1960. #2 02/23/2020, #3 03/24/2020.  Endorsing approximately 75 to 80% pain relief for bilateral knee pain.  Lumbar L3-5 RFA, Left: 02/16/21, Right 03/09/21, 01/02/22             Recent Visits Date Type Provider Dept  01/08/23 Procedure visit Edward Jolly, MD [facility]  Showing recent visits within past 90 days and meeting all other requirements Today's Visits Date Type Provider Dept  02/13/23 Office Visit Edward Jolly, MD [facility]  Showing today's visits and meeting all other requirements Future Appointments Date Type Provider Dept  03/28/23 Appointment Edward Jolly, MD [facility]  05/09/23 Appointment Edward Jolly, MD [facility]  Showing future appointments within next 90 days and meeting all other requirements  I discussed the assessment and treatment plan with the patient. The patient was provided an opportunity to ask questions and all were answered. The patient agreed with the plan and demonstrated an understanding of the instructions.  Patient advised to call back or seek an in-person evaluation if the symptoms or condition worsens.  Duration of encounter: .  Total time on encounter, as per AMA guidelines included both the face-to-face and non-face-to-face time personally spent by the physician and/or other qualified health care professional(s) on the day of the encounter (includes  time in activities that require the physician or other qualified health care professional and does not include time in activities normally performed by clinical  staff). Physician's time may include the following activities when performed: Preparing to see the patient (e.g., pre-charting review of records, searching for previously ordered imaging, lab work, and nerve conduction tests) Review of prior analgesic pharmacotherapies. Reviewing PMP Interpreting ordered tests (e.g., lab work, imaging, nerve conduction tests) Performing post-procedure evaluations, including interpretation of diagnostic procedures Obtaining and/or reviewing separately obtained history Performing a medically appropriate examination and/or evaluation Counseling and educating the patient/family/caregiver Ordering medications, tests, or procedures Referring and communicating with other health care professionals (when not separately reported) Documenting clinical information in the electronic or other health record Independently interpreting results (not separately reported) and communicating results to the patient/ family/caregiver Care coordination (not separately reported)  Note by: Edward Jolly, MD Date: 02/13/2023; Time: 11:29 AM

## 2023-02-14 DIAGNOSIS — M75122 Complete rotator cuff tear or rupture of left shoulder, not specified as traumatic: Secondary | ICD-10-CM | POA: Diagnosis not present

## 2023-02-15 ENCOUNTER — Encounter: Payer: Self-pay | Admitting: Internal Medicine

## 2023-02-15 ENCOUNTER — Ambulatory Visit: Payer: Medicare Other | Attending: Internal Medicine | Admitting: Internal Medicine

## 2023-02-15 VITALS — BP 148/93 | HR 76 | Ht 73.0 in | Wt 348.6 lb

## 2023-02-15 DIAGNOSIS — I4819 Other persistent atrial fibrillation: Secondary | ICD-10-CM | POA: Diagnosis not present

## 2023-02-15 DIAGNOSIS — Z79899 Other long term (current) drug therapy: Secondary | ICD-10-CM

## 2023-02-15 DIAGNOSIS — E039 Hypothyroidism, unspecified: Secondary | ICD-10-CM | POA: Diagnosis not present

## 2023-02-15 MED ORDER — AMLODIPINE BESYLATE 2.5 MG PO TABS: 2.5000 mg | ORAL_TABLET | Freq: Every day | ORAL | 3 refills | Status: DC

## 2023-02-15 NOTE — Patient Instructions (Signed)
Medication Instructions:  Your physician has recommended you make the following change in your medication:   ** Continue Amiodarone as directed by Dr Graciela Husbands  ** Begin Amlodipine 2.5mg  - 1 tablet by mouth daily.  *If you need a refill on your cardiac medications before your next appointment, please call your pharmacy*   Lab Work: TSH today If you have labs (blood work) drawn today and your tests are completely normal, you will receive your results only by: MyChart Message (if you have MyChart) OR A paper copy in the mail If you have any lab test that is abnormal or we need to change your treatment, we will call you to review the results.   Testing/Procedures: None ordered.    Follow-Up: At [facility], you and your health needs are our priority.  As part of our continuing mission to provide you with exceptional heart care, we have created designated Provider Care Teams.  These Care Teams include your primary Cardiologist (physician) and Advanced Practice Providers (APPs -  Physician Assistants and Nurse Practitioners) who all work together to provide you with the care you need, when you need it.  We recommend signing up for the patient portal called "MyChart".  Sign up information is provided on this After Visit Summary.  MyChart is used to connect with patients for Virtual Visits (Telemedicine).  Patients are able to view lab/test results, encounter notes, upcoming appointments, etc.  Non-urgent messages can be sent to your provider as well.   To learn more about what you can do with MyChart, go to [email or web address].    Your next appointment:   3 months with Dr Graciela Husbands

## 2023-02-15 NOTE — Progress Notes (Unsigned)
Patient Care Team: Smitty Cords, DO as PCP - General (Family Medicine) Antonieta Iba, MD as PCP - Cardiology (Cardiology) Duke Salvia, MD as PCP - Electrophysiology (Cardiology) Earna Coder, MD as Consulting Physician (Oncology)   HPI  [name] is a 68 y.o. male seen in follow-up for atrial fibrillation of longstanding on amiodarone for many years with cardioversion infrequently.  Most recently 2024 (TG) which failed and prompted him to discontinue the amiodarone which I subsequently then resumed.  We did successfully cardioverted him 7/24  Recurrent atrial fibrillation and has seen AG([facility]) and is scheduled for ablation 11/24  The patient denies chest pain, nocturnal dyspnea, orthopnea.  There have been no palpitations, lightheadedness or syncope.  Complains of mild chronic shortness of breath.  Trace edema.  Doing much better following his catheter ablation at [facility].  Amiodarone was decreased and then to be discontinued 2/25.  He brings in [employer] tracings as noted below with some interval atrial fibrillation that was asymptomatic.        DATE TEST EF    12/15 Echo   45-50 %    10/16 CTA   CAScore 224  6/21 Echo   55 % LAE moderate 4.7 cm   5/24 Echo  55-60%  43 ml/m2     Date Cr K Hgb TSH LFTs  4/21     13.1    3/23 1.13 4.4 10.7<<10.0     3/24 1.07 (4/24) 4.4(4/24) 11.4 (mcv 71)    6/24   13.3 (mcv 76) 1.4   1/25 0.95 4.5 14.4  15    Antiarrhythmics Date Reason stopped  Amiodarone 2016 Failure 3/24 DC by TG, resumed by SK          Records and Results Reviewed   Past Medical History:  Diagnosis Date   Anxiety    Aortic atherosclerosis (HCC)    Cardiomyopathy (in setting of Afib)    a.) TTE 12/26/2013: EF 45-50%, mild ant and antsept HK. mild MR. Mod dil LA. nl RV fxn. Rhythm was Afib; b.) TTE 07/04/2019: EF 55%, mid-apical anteroseptal HK, mild MAC, mild Ao sclerosis, G2DD, RVSP 45.3   Chronic pain syndrome    a.) followed  by pain management   Chronic, continuous use of opioids    a.) hydrocodone/APAP 7.5/325 mg; followed by pain management   Coronary artery disease 11/06/2014   a.) cCTA 11/06/2014: Ca score 224 (all in pLAD) -- 74th percentile for age/sex matched control   Diverticulosis    DJD (degenerative joint disease) of knee    Former smoker    History of hiatal hernia    History of kidney stones 2012   Hyperlipidemia    Hyperplastic colon polyp    Hypertension    Inguinal hernia    left   Internal hemorrhoids    Intervertebral disc disorder with radiculopathy of lumbosacral region    Lesion of bone of lumbosacral spine    L5   Long term current use of amiodarone    Long term current use of antithrombotics/antiplatelets    a.) rivaroxaban   Lower extremity edema    Mixed hyperlipidemia    Morbid obesity (HCC)    Multiple falls    Myalgia due to statin    Osteoarthritis    PAF (paroxysmal atrial fibrillation) (HCC)    a.) CHA2DS2VASc = 4 (age, HTN, CHF, vascular disease history);  b.) s/p DCCV 03/13/2014 (200 J x1); c.) s/p DCCV  05/15/2019 (150 J x 1, 200 J x2); d.) rate/rhythm maintained on oral amiodarone + metoprolol succinate; chronically anticoagulated with rivaroxaban   Pernicious anemia    Prostate cancer (HCC)    Pulmonary nodule, right    a. 10/2014 Cardiac CTA: 7mm RLL nodule; b. 04/2015 CT Chest: stable 7mm RLL nodule. No new nodules; 02/2017 CTA Chest: stable, benign, 7mm RLL pulm nodule.   Rotator cuff tear, left    Sleep difficulties    a.) takes melatonin + trazodone PRN   SVT (supraventricular tachycardia) (HCC)    Tubular adenoma of colon     Past Surgical History:  Procedure Laterality Date   BICEPT TENODESIS Right 09/27/2021   Procedure: Right reverse shoulder arthroplasty, biceps tenodesis;  Surgeon: Signa Kell, MD;  Location: [facility];  Service: Orthopedics;  Laterality: Right;   CARDIOVERSION N/A 05/15/2019   Procedure: CARDIOVERSION;  Surgeon: Antonieta Iba, MD;  Location: [facility];  Service: Cardiovascular;  Laterality: N/A;   CARDIOVERSION N/A 03/13/2014   Procedure: CARDIOVERSION; Location: [facility]; Surgeon: [name], MD   CARDIOVERSION N/A 03/24/2022   Procedure: CARDIOVERSION;  Surgeon: Antonieta Iba, MD;  Location: [facility];  Service: Cardiovascular;  Laterality: N/A;   CARDIOVERSION N/A 05/19/2022   Procedure: CARDIOVERSION;  Surgeon: Antonieta Iba, MD;  Location: [facility];  Service: Cardiovascular;  Laterality: N/A;   CARDIOVERSION N/A 07/25/2022   Procedure: CARDIOVERSION;  Surgeon: Duke Salvia, MD;  Location: [facility];  Service: Cardiovascular;  Laterality: N/A;   CARDIOVERSION N/A 09/21/2022   Procedure: CARDIOVERSION;  Surgeon: Duke Salvia, MD;  Location: [facility];  Service: Cardiovascular;  Laterality: N/A;   CARPAL TUNNEL RELEASE Right 12/23/2014   Procedure: CARPAL TUNNEL RELEASE;  Surgeon: Deeann Saint, MD;  Location: [facility];  Service: Orthopedics;  Laterality: Right;   CARPAL TUNNEL RELEASE Left 01/06/2015   Procedure: CARPAL TUNNEL RELEASE;  Surgeon: Deeann Saint, MD;  Location: [facility];  Service: Orthopedics;  Laterality: Left;   COLONOSCOPY WITH PROPOFOL N/A 10/08/2017   Procedure: COLONOSCOPY WITH PROPOFOL;  Surgeon: Toney Reil, MD;  Location: [facility] ENDOSCOPY;  Service: Gastroenterology;  Laterality: N/A;   COLONOSCOPY WITH PROPOFOL N/A 06/07/2022   Procedure: COLONOSCOPY WITH PROPOFOL;  Surgeon: Toney Reil, MD;  Location[facility] ENDOSCOPY;  Service: Gastroenterology;  Laterality: N/A;   ENTEROSCOPY  06/07/2022   Procedure: ENTEROSCOPY;  Surgeon: Toney Reil, MD;  Location[facility] ENDOSCOPY;  Service: Gastroenterology;;   ESOPHAGOGASTRODUODENOSCOPY (EGD) WITH PROPOFOL N/A 06/07/2022   Procedure: ESOPHAGOGASTRODUODENOSCOPY (EGD) WITH PROPOFOL;  Surgeon: Toney Reil, MD;  Location: [facility] ENDOSCOPY;  Service: Gastroenterology;  Laterality: N/A;   GASTRIC BYPASS  01/17/2000   KNEE  SURGERY Right    knee trauma x3   prostate seeding     REVERSE SHOULDER ARTHROPLASTY Right 09/27/2021   Procedure: Right reverse shoulder arthroplasty, biceps tenodesis;  Surgeon: Signa Kell, MD;  Location: [facility];  Service: Orthopedics;  Laterality: Right;   SHOULDER ARTHROSCOPY WITH ROTATOR CUFF REPAIR AND OPEN BICEPS TENODESIS Left 12/27/2021   Procedure: Left shoulder arthroscopic cuff repair (supraspinatus and subscapularis) with Regeneten Patch application;  Surgeon: Signa Kell, MD;  Location: [facility];  Service: Orthopedics;  Laterality: Left;    Current Meds  Medication Sig   amiodarone (PACERONE) 200 MG tablet TAKE ONE TABLET BY MOUTH TWICE A DAY (Patient taking differently: Take 200 mg by mouth in the morning.)   benazepril (LOTENSIN) 40 MG tablet TAKE ONE TABLET BY MOUTH ONE TIME DAILY (Patient taking differently:  Take 40 mg by mouth every morning. TAKE ONE TABLET BY MOUTH ONE TIME DAILY)   busPIRone (BUSPAR) 10 MG tablet TAKE ONE TABLET BY MOUTH FOUR TIMES A DAY AS NEEDED FOR ANIXETY (Patient taking differently: Take 10 mg by mouth every morning. And PRN)   carboxymethylcellulose (REFRESH PLUS) 0.5 % SOLN Place 1-2 drops into both eyes 3 (three) times daily as needed (dry/irritated eyes.).   cyanocobalamin (VITAMIN B12) 1000 MCG/ML injection INJECT INTO THE SKIN EVERY 30 DAYS AS DIRECTED (Patient taking differently: Inject 1,000 mcg into the skin every 30 (thirty) days.)   cyclobenzaprine (FLEXERIL) 10 MG tablet Take 1 tablet (10 mg total) by mouth at bedtime.   DULoxetine (CYMBALTA) 60 MG capsule Take 1 capsule (60 mg total) by mouth daily. (Patient taking differently: Take 60 mg by mouth every morning.)   FERROUS BISGLYCINATE CHELATE PO Take 25 mg by mouth daily. Bronson Iron Bisglycinate 25 mg Gentle on The Stomach Supports Energy & Healthy Red Blood Cell Production   fluticasone (FLONASE) 50 MCG/ACT nasal spray USE TWO SPRAYS IN EACH NOSTRIL ONE TIME DAILY (Patient taking  differently: Place 2 sprays into both nostrils daily as needed for allergies.)   [START ON 02/17/2023] HYDROcodone-acetaminophen (NORCO) 7.5-325 MG tablet Take 1 tablet by mouth every 6 (six) hours as needed for severe pain (pain score 7-10). Must last 30 days   [START ON 03/19/2023] HYDROcodone-acetaminophen (NORCO) 7.5-325 MG tablet Take 1 tablet by mouth every 6 (six) hours as needed for severe pain (pain score 7-10). Must last 30 days   [START ON 04/18/2023] HYDROcodone-acetaminophen (NORCO) 7.5-325 MG tablet Take 1 tablet by mouth every 6 (six) hours as needed for severe pain (pain score 7-10). Must last 30 days   melatonin 3 MG TABS tablet Take 3 mg by mouth at bedtime.   Multiple Vitamin (MULTIVITAMIN WITH MINERALS) TABS tablet Take 1 tablet by mouth in the morning.   rivaroxaban (XARELTO) 20 MG TABS tablet TAKE ONE TABLET BY MOUTH ONE TIME DAILY WITH SUPPER (Patient taking differently: Take 20 mg by mouth at bedtime.)   traZODone (DESYREL) 150 MG tablet TAKE ONE TABLET BY MOUTH AT BEDTIME   ZEPBOUND 2.5 MG/0.5ML Pen Inject 2.5 mg into the skin once a week.    Allergies  Allergen Reactions   Bupivacaine Liposome Palpitations    Pt was given this for shoulder surgery in 2023 and states heart was pounding      Review of Systems negative except from HPI and PMH  Physical Exam BP (!) 148/93 (BP Location: Left Arm, Patient Position: Sitting, Cuff Size: Normal)   Pulse 76   Ht 6\' 1"  (1.854 m)   Wt (!) 348 lb 9.6 oz (158.1 kg)   SpO2 94%   BMI 45.99 kg/m  Well developed and  Morbidly obese  in no acute distress HENT normal Neck supple   Clear Regular rate and rhythm, no murmurs or gallops Abd-soft with active BS No Clubbing cyanosis edema Skin-warm and dry A & Oriented  Grossly normal sensory and motor function  ECG sinus @ 76 18/10/40   Estimated Creatinine Clearance: 115.4 mL/min (by C-G formula based on SCr of 0.95 mg/dL).   Assessment and  Plan Atrial Fibrillation   persistent    Anemia secondary to GI blood loss question cause possible hemorrhoids   Hypertension   Amiodarone therapy -long term   HFpEF class III  Orthostatic lightheadedness   Atrial fibrillation much improved following catheter ablation at [facility] 11/24.  From episodes  self-limited.  Have elected to continue with amiodarone at 200 mg a day with review of this in about 3 more months.  Will need to check his TSH.  Orthostatic lightheadedness is much improved.  Blood pressure is elevated has been so over the last month.  Will add low-dose amlodipine 2.5 to his Lotensin    Hemoglobin has normalized

## 2023-02-16 ENCOUNTER — Telehealth: Payer: Self-pay

## 2023-02-16 LAB — TSH: TSH: 1.28 u[IU]/mL (ref 0.450–4.500)

## 2023-02-16 NOTE — Telephone Encounter (Signed)
Key: [id number] 

## 2023-02-19 ENCOUNTER — Encounter: Payer: Self-pay | Admitting: Orthopedic Surgery

## 2023-02-19 NOTE — Progress Notes (Signed)
Perioperative / Anesthesia Services  Pre-Admission Testing Clinical Review / Pre-Operative Anesthesia Consult  Date: 02/19/23  Patient Demographics:  Name: [name] DOB: [birth year] MRN:   [id number]  Planned Surgical Procedure(s):    Case: [id number] Date/Time: 02/20/23 0715   Procedure: Left reverse shoulder arthroplasty, biceps tenodesis (Left: Shoulder)   Anesthesia type: Choice   Pre-op diagnosis: Complete tear of left rotator cuff, unspecified whether traumatic M75.122   Location: [facility] OR ROOM 01 / [facility]S FOR ANEST[facility]ES[facility]   Surgeons: Signa Kell, MD      NOTE: Available PAT nursing documentation and vital signs have been reviewed. Clinical nursing staff has updated patient's PMH/PSHx, current medication list, and drug allergies/intolerances to ensure comprehensive history available to assist in medical decision making as it pertains to the aforementioned surgical procedure and anticipated anesthetic course. Extensive review of available clinical information personally performed. Marcus PMH and PSHx updated with any diagnoses/procedures that  may have been inadvertently omitted during his intake with the pre-admission testing department's nursing staff.  Clinical Discussion:  [name] is a 68 y.o. male who is submitted for pre-surgical anesthesia review and clearance prior to him undergoing the above procedure. Patient is a Former Smoker (10 pack years; quit 02/1979). Pertinent PMH includes: CAD, cardiomyopathy, HFpEF, PAF, aortic atherosclerosis, SVT, HTN, HLD, hiatal hernia, pernicious anemia, prostate cancer, OA,LEFT rotator cuff tear,  DJD, lumbosacral DDD, chronic pain syndrome, chronic opioid use, sleep difficulties.   Patient is followed by cardiology Mariah Milling, MD). He was last seen in the cardiology clinic on 02/15/2023; notes reviewed. At the time of his clinic visit, patient doing well overall from a cardiovascular perspective. Patient with mild chronic  shortness of breath; stable and at baseline. Was feeling much better following recent procedure with electrophysiology (PVI ablation). Patient denied any chest pain, PND, orthopnea, palpitations, significant peripheral edema, weakness, fatigue, vertiginous symptoms, or presyncope/syncope. Patient with a past medical history significant for cardiovascular diagnoses. Documented physical exam was grossly benign, providing no evidence of acute exacerbation and/or decompensation of the patient's known cardiovascular conditions.  Patient with a history of cardiomyopathy in the setting of known atrial fibrillation.  TTE performed on 12/26/2013 revealed a mildly reduced left ventricular systolic function with an EF of 45-50%.  There was mild anterior and anteroseptal hypokinesis.  Left atrium was mildly dilated.  Right ventricular systolic function normal.  There was mild mitral valve regurgitation.  There was no evidence of a significant transvalvular gradient to suggest stenosis.  Coronary CTA was performed on 11/06/2014 that demonstrated an Agatston coronary artery calcium score of 224. This placed patient in the 74th percentile for age, sex, and race matched controls. Calcium depositions noted to be isolated mainly in the LAD distribution.  Study demonstrates normal coronary origin with RIGHT dominance.  Patient with an atrial fibrillation diagnosis. He has undergone multiple procedures in the past surrounding this diagnosis.   DCCV procedure performed on 03/13/2014.  Patient received a single 200 J synchronized cardioversion restoring him to NSR.  Atrial arrhythmia refractory.  Repeat DCCV procedure was performed on 05/15/2019.  Patient received 150 J x 1, followed by 200 J x 2 synchronized cardioversion before being restored to NSR.  Atrial arrhythmia refractory.  Repeat DCCV procedure was performed on 05/19/2022.  Pads were placed and a anterior-posterior orientation and manual pressure was applied to  pads.  Patient received a single 150 J, followed by 200 J x 2 synchronized cardioversions, which did not convert patient to a sinus rhythm.  Orientation of pads was changed to an anterior-lateral position.  Manual pressure again placed on pads prior to energy delivery. Patient received additional 200 J x 2 synchronized cardioversion attempts, however patient remained and atrial fibrillation.  Atrial arrhythmia refractory.  Repeat DCCV procedure was performed on 07/25/2022, at which time patient received a single 200 J synchronized cardioversion restoring him to a normal sinus rhythm.  Patient again with refractory atrial arrhythmia.  He underwent repeat DCCV procedure on 09/21/2022, at which time he received a single 200 J second cardioversion, again converting him to a normal sinus rhythm.  Atrial arrhythmia refractory.  He has undergone multiple DCCV procedures in the past.  The decision was made to undergo cardiac ablation.  Patient was seen at [facility] by Dr. Sheran Luz, MD and ultimately underwent at pulmonary vein isolation ablation on 11/17/2022.  Most recent TTE was performed on 06/02/2022 revealing a normal left ventricular systolic function with an EF of 55-60%.  There were no regional wall motion abnormalities.  Left ventricular diastolic Doppler parameters were indeterminate.  Right ventricular size and function was normal.  Estimated RVSP 37.9 mmHg.  Left atrium noted to be severely enlarged.  There was mild to moderate mitral and mild aortic valve regurgitation.  Aortic valve sclerotic with no evidence of stenosis.  All transvalvular gradients were noted to be normal providing no evidence suggestive of any significant degrees of stenosis.  Ascending aorta was borderline dilated at 38 mm.  Again, patient with atrial fibrillation diagnosis; CHA2DS2-VASc Score = 4 (age, HTN, CHF, vascular disease).  Following his ablation and November 2024, patient remains on oral amiodarone. Discussed discontinuing  this medication, however patient requested to remain on the intervention for another 3 months. He also remains on daily oral anticoagulation therapy using rivaroxaban.  Patient is reportedly compliant with therapy with no evidence or reports of GI/GU related bleeding.  Blood pressure mildly elevated at 148/93 on prescribed ACEi (benazepril) monotherapy.  Patient is not currently taking any type of lipid-lowering therapies for his HLD diagnosis and ASCVD prevention.  Patient formally on rosuvastatin + ezetimibe. Patient self discontinued these medications secondary to associated myalgias. HLD diagnosis currently being managed with diet lifestyle modification alone. Started on PCSK9i (evolocumab), however it appears as if patient self discontinued this medication as well. He is not diabetic.  Patient does not have an OSAH diagnosis. Patient is able to complete all of his  ADL/IADLs without cardiovascular limitation.  Per the DASI, patient is able to achieve at least 4 METS of physical activity without experiencing any significant degree of angina/anginal equivalent symptoms. Given mild blood pressure elevation, low dose CCB (amlodipine) was added. No other changes were made to his medication regimen during his visit with cardiology.  Patient scheduled to follow-up with outpatient cardiology in 3 months or sooner if needed.  Ruel Favors Bartol is scheduled for an elective LEFT REVERSE SHOULDER ARTHROPLASTY, BICEPS TENODESIS (LEFT: SHOULDER) on 02/20/2023 with Dr. Signa Kell, MD. Given patient's past medical history significant for cardiovascular diagnoses, presurgical cardiac clearance was sought by the PAT team. Per cardiology, "this patient is optimized for surgery and may proceed with the planned procedural course with an ACCEPTABLE risk of significant perioperative cardiovascular complications".  Again, this patient is on daily oral anticoagulation therapy using a DOAC medication.  He has been instructed on  recommendations for holding his rivaroxaban for 3 days prior to his procedure with plans to restart as soon as postoperative bleeding risk felt to be minimized by his attending  Careers adviser. The patient has been instructed that his last dose of rivaroxaban should be on 02/15/2022.  Patient denies previous perioperative complications with anesthesia in the past. In review his EMR, it is noted that patient underwent a general anesthetic course at [facility] ([facility]) in 11/2022 without documented complications.      02/15/2023    3:10 PM 02/13/2023   11:07 AM 02/12/2023    2:34 PM  Vitals with BMI  Height 6\' 1"  6\' 0"  6\' 0"   Weight 348 lbs 10 oz 351 lbs 351 lbs 14 oz  BMI 46 47.59 47.71  Systolic 148 142 161  Diastolic 93 91 87  Pulse 76 83 69   Providers/Specialists:  NOTE: Primary physician provider listed below. Patient may have been seen by APP or partner within same practice.   PROVIDER ROLE / SPECIALTY LAST Sarina Ser, MD Orthopedics  (Surgeon) 02/14/2023  Smitty Cords, DO Primary Care Provider 01/24/2023  [name], MD Cardiology 05/02/2022  [name], MD Electrophysiology 02/15/2023  Sheran Luz, MD Electrophysiology 01/16/2023  Edward Jolly, MD Pain Management 02/13/2023  Louretta Shorten, MD Medical Oncology/Hematology 10/30/2022   Allergies:   Allergies  Allergen Reactions   Bupivacaine Liposome Palpitations    Pt was given this for shoulder surgery in 2023 and states heart was pounding   Current Home Medications:   No current facility-administered medications for this encounter.    amiodarone (PACERONE) 200 MG tablet   benazepril (LOTENSIN) 40 MG tablet   busPIRone (BUSPAR) 10 MG tablet   carboxymethylcellulose (REFRESH PLUS) 0.5 % SOLN   cyanocobalamin (VITAMIN B12) 1000 MCG/ML injection   DULoxetine (CYMBALTA) 60 MG capsule   FERROUS BISGLYCINATE CHELATE PO   fluticasone (FLONASE) 50 MCG/ACT nasal  spray   melatonin 3 MG TABS tablet   Multiple Vitamin (MULTIVITAMIN WITH MINERALS) TABS tablet   rivaroxaban (XARELTO) 20 MG TABS tablet   traZODone (DESYREL) 150 MG tablet   amLODipine (NORVASC) 2.5 MG tablet   cyclobenzaprine (FLEXERIL) 10 MG tablet   HYDROcodone-acetaminophen (NORCO) 7.5-325 MG tablet   [START ON 03/19/2023] HYDROcodone-acetaminophen (NORCO) 7.5-325 MG tablet   [START ON 04/18/2023] HYDROcodone-acetaminophen (NORCO) 7.5-325 MG tablet   ZEPBOUND 2.5 MG/0.5ML Pen   History:   Past Medical History:  Diagnosis Date   Anxiety    Aortic atherosclerosis (HCC)    Cardiomyopathy (in setting of Afib)    a.) TTE 12/26/2013: EF 45-50%, mild ant and antsept HK. mild MR. Mod dil LA. nl RV fxn. Rhythm was Afib; b.) TTE 07/04/2019: EF 55%, mid-apical anteroseptal HK, mild MAC, mild Ao sclerosis, G2DD, RVSP 45.3; c.) TTE 06/02/2022: EF 55-60%, no RWMAs, sev LAE, RVSP 37.9, mild-mod MR, mild AR, AoV sclerosis without stenosis, asc Ao 38 mm   Chronic pain syndrome    a.) followed by pain management   Chronic, continuous use of opioids    a.) on COT; followed by pain management   Coronary artery disease 11/06/2014   a.) cCTA 11/06/2014: Ca2+ = 224 (74th %ile; LAD distribution)   Diverticulosis    DJD (degenerative joint disease) of knee    Former smoker    History of hiatal hernia    History of kidney stones 2012   Hyperlipidemia    Hyperplastic colon polyp    Hypertension    Inguinal hernia, left    Internal hemorrhoids    Intervertebral disc disorder with radiculopathy of lumbosacral region    Lesion of bone of lumbosacral spine  L5   Long term current use of amiodarone    Lower extremity edema    Mixed hyperlipidemia    Morbid obesity (HCC)    Multiple falls    Myalgia due to statin    On rivaroxaban therapy    Osteoarthritis    PAF (paroxysmal atrial fibrillation) (HCC)    a.) CHA2DS2VASc = 4 (age, HTN, CHF, vascular disease) as of 02/19/2023;  b.) s/p DCCV  03/13/2014 (200 J x1), 05/15/2019 (150 J x 1, 200 J x2), 05/19/2022 (150 J x1, 200 J x2; pads cahnged to ant/lat postion with additional 200 J x 2 --> did not convet), 07/25/2022 (200 J x 1), 09/21/2022 (200 J x1); c.) rate/rhythm maintained on oral amiodarone; chronically anticoagulated with rivaroxaban   Pernicious anemia    Prostate cancer (HCC)    Pulmonary nodule, right    a. 10/2014 Cardiac CTA: 7mm RLL nodule; b. 04/2015 CT Chest: stable 7mm RLL nodule. No new nodules; 02/2017 CTA Chest: stable, benign, 7mm RLL pulm nodule.   Rotator cuff tear, left    Sleep difficulties    a.) takes melatonin + trazodone PRN   SVT (supraventricular tachycardia) (HCC)    Tubular adenoma of colon    Past Surgical History:  Procedure Laterality Date   BICEPT TENODESIS Right 09/27/2021   Procedure: Right reverse shoulder arthroplasty, biceps tenodesis;  Surgeon: Signa Kell, MD;  Location: [facility] ORS;  Service: Orthopedics;  Laterality: Right;   CARDIOVERSION N/A 05/15/2019   Procedure: CARDIOVERSION;  Surgeon: Antonieta Iba, MD;  Location: [facility] ORS;  Service: Cardiovascular;  Laterality: N/A;   CARDIOVERSION N/A 03/13/2014   Procedure: CARDIOVERSION; Location: [facility]; Surgeon: [name], MD   CARDIOVERSION N/A 03/24/2022   Procedure: CARDIOVERSION;  Surgeon: Antonieta Iba, MD;  Location: [facility] ORS;  Service: Cardiovascular;  Laterality: N/A;   CARDIOVERSION N/A 05/19/2022   Procedure: CARDIOVERSION;  Surgeon: Antonieta Iba, MD;  Location: [facility] ORS;  Service: Cardiovascular;  Laterality: N/A;   CARDIOVERSION N/A 07/25/2022   Procedure: CARDIOVERSION;  Surgeon: Duke Salvia, MD;  Location: [facility] ORS;  Service: Cardiovascular;  Laterality: N/A;   CARDIOVERSION N/A 09/21/2022   Procedure: CARDIOVERSION;  Surgeon: Duke Salvia, MD;  Location: [facility] ORS;  Service: Cardiovascular;  Laterality: N/A;   CARPAL TUNNEL RELEASE Right 12/23/2014   Procedure: CARPAL TUNNEL RELEASE;  Surgeon: Deeann Saint,  MD;  Location: [facility] ORS;  Service: Orthopedics;  Laterality: Right;   CARPAL TUNNEL RELEASE Left 01/06/2015   Procedure: CARPAL TUNNEL RELEASE;  Surgeon: Deeann Saint, MD;  Location: [facility] ORS;  Service: Orthopedics;  Laterality: Left;   COLONOSCOPY WITH PROPOFOL N/A 10/08/2017   Procedure: COLONOSCOPY WITH PROPOFOL;  Surgeon: Toney Reil, MD;  Location: [facility] ENDOSCOPY;  Service: Gastroenterology;  Laterality: N/A;   COLONOSCOPY WITH PROPOFOL N/A 06/07/2022   Procedure: COLONOSCOPY WITH PROPOFOL;  Surgeon: Toney Reil, MD;  Location: [facility] ENDOSCOPY;  Service: Gastroenterology;  Laterality: N/A;   ENTEROSCOPY  06/07/2022   Procedure: ENTEROSCOPY;  Surgeon: Toney Reil, MD;  Location: [facility] ENDOSCOPY;  Service: Gastroenterology;;   ESOPHAGOGASTRODUODENOSCOPY (EGD) WITH PROPOFOL N/A 06/07/2022   Procedure: ESOPHAGOGASTRODUODENOSCOPY (EGD) WITH PROPOFOL;  Surgeon: Toney Reil, MD;  Location: [facility] ENDOSCOPY;  Service: Gastroenterology;  Laterality: N/A;   GASTRIC BYPASS  01/17/2000   KNEE SURGERY Right    knee trauma x3   prostate seeding     REVERSE SHOULDER ARTHROPLASTY Right 09/27/2021   Procedure: Right reverse shoulder arthroplasty, biceps tenodesis;  Surgeon: Signa Kell, MD;  Location: [facility] ORS;  Service: Orthopedics;  Laterality: Right;   SHOULDER ARTHROSCOPY WITH ROTATOR CUFF REPAIR AND OPEN BICEPS TENODESIS Left 12/27/2021   Procedure: Left shoulder arthroscopic cuff repair (supraspinatus and subscapularis) with Regeneten Patch application;  Surgeon: Signa Kell, MD;  Location: [facility] ORS;  Service: Orthopedics;  Laterality: Left;   Family History  Problem Relation Age of Onset   Heart disease Father    Hypertension Father    Stroke Father    Cancer Father        prostate cancer   Cancer Sister    Arrhythmia Sister        A-fib   Arrhythmia Brother        A-fib   Cancer Brother    Social History   Tobacco Use   Smoking status: Former    Current  packs/day: 0.00    Average packs/day: 1 pack/day for 10.0 years (10.0 ttl pk-yrs)    Types: Cigarettes    Start date: 02/17/1969    Quit date: 02/18/1979    Years since quitting: 44.0   Smokeless tobacco: Former    Types: Snuff  Substance Use Topics   Alcohol use: Not Currently    Comment: last drink in 2021   Pertinent Clinical Results:  LABS:  Office Visit on 02/15/2023  Component Date Value Ref Range Status   TSH 02/15/2023 1.280  0.450 - 4.500 uIU/mL Final  Hospital Outpatient Visit on 02/12/2023  Component Date Value Ref Range Status   MRSA, PCR 02/12/2023 NEGATIVE  NEGATIVE Final   Staphylococcus aureus 02/12/2023 NEGATIVE  NEGATIVE Final   Comment: (NOTE) The Xpert SA Assay (FDA approved for NASAL specimens in patients 13 years of age and older), is one component of a comprehensive surveillance program. It is not intended to diagnose infection nor to guide or monitor treatment. Performed at [facility], [address] [place], [place] [address]    WBC 02/12/2023 6.6  4.0 - 10.5 K/uL Final   RBC 02/12/2023 4.93  4.22 - 5.81 MIL/uL Final   Hemoglobin 02/12/2023 14.4  13.0 - 17.0 g/dL Final   HCT 60/45/4098 43.4  39.0 - 52.0 % Final   MCV 02/12/2023 88.0  80.0 - 100.0 fL Final   MCH 02/12/2023 29.2  26.0 - 34.0 pg Final   MCHC 02/12/2023 33.2  30.0 - 36.0 g/dL Final   RDW 11/91/4782 13.2  11.5 - 15.5 % Final   Platelets 02/12/2023 201  150 - 400 K/uL Final   nRBC 02/12/2023 0.0  0.0 - 0.2 % Final   Neutrophils Relative % 02/12/2023 57  % Final   Neutro Abs 02/12/2023 3.7  1.7 - 7.7 K/uL Final   Lymphocytes Relative 02/12/2023 30  % Final   Lymphs Abs 02/12/2023 2.0  0.7 - 4.0 K/uL Final   Monocytes Relative 02/12/2023 11  % Final   Monocytes Absolute 02/12/2023 0.8  0.1 - 1.0 K/uL Final   Eosinophils Relative 02/12/2023 1  % Final   Eosinophils Absolute 02/12/2023 0.1  0.0 - 0.5 K/uL Final   Basophils Relative 02/12/2023 1  % Final   Basophils Absolute  02/12/2023 0.0  0.0 - 0.1 K/uL Final   Immature Granulocytes 02/12/2023 0  % Final   Abs Immature Granulocytes 02/12/2023 0.02  0.00 - 0.07 K/uL Final   Performed at [facility], [address] [place], [place] [address]   Sodium 02/12/2023 139  135 - 145 mmol/L Final   Potassium 02/12/2023 4.5  3.5 - 5.1  mmol/L Final   Chloride 02/12/2023 106  98 - 111 mmol/L Final   CO2 02/12/2023 24  22 - 32 mmol/L Final   Glucose, Bld 02/12/2023 85  70 - 99 mg/dL Final   Glucose reference range applies only to samples taken after fasting for at least 8 hours.   BUN 02/12/2023 13  8 - 23 mg/dL Final   Creatinine, Ser 02/12/2023 0.95  0.61 - 1.24 mg/dL Final   Calcium 16/10/9602 8.8 (L)  8.9 - 10.3 mg/dL Final   Total Protein 54/09/8117 6.6  6.5 - 8.1 g/dL Final   Albumin 14/78/2956 4.2  3.5 - 5.0 g/dL Final   AST 21/30/8657 18  15 - 41 U/L Final   ALT 02/12/2023 15  0 - 44 U/L Final   Alkaline Phosphatase 02/12/2023 71  38 - 126 U/L Final   Total Bilirubin 02/12/2023 0.6  0.0 - 1.2 mg/dL Final   GFR, Estimated 02/12/2023 >60  >60 mL/min Final   Comment: (NOTE) Calculated using the CKD-EPI Creatinine Equation (2021)    Anion gap 02/12/2023 9  5 - 15 Final   Performed at [facility], [address] [place], [place] [address]   Color, Urine 02/12/2023 YELLOW (A)  YELLOW Final   APPearance 02/12/2023 CLEAR (A)  CLEAR Final   Specific Gravity, Urine 02/12/2023 1.014  1.005 - 1.030 Final   pH 02/12/2023 5.0  5.0 - 8.0 Final   Glucose, UA 02/12/2023 NEGATIVE  NEGATIVE mg/dL Final   Hgb urine dipstick 02/12/2023 NEGATIVE  NEGATIVE Final   Bilirubin Urine 02/12/2023 NEGATIVE  NEGATIVE Final   Ketones, ur 02/12/2023 NEGATIVE  NEGATIVE mg/dL Final   Protein, ur 29/52/8413 NEGATIVE  NEGATIVE mg/dL Final   Nitrite 24/40/1027 NEGATIVE  NEGATIVE Final   Leukocytes,Ua 02/12/2023 NEGATIVE  NEGATIVE Final   Performed at [facility], [address] [place], [place] [address]   Office Visit on 01/24/2023  Component Date Value Ref Range Status   Hemoglobin A1C 01/24/2023 5.3  4.0 - 5.6 % Final    ECG: Date: 02/15/2023 Time ECG obtained: 1519 PM Rate: 76 bpm Rhythm: normal sinus Axis (leads I and aVF): normal Intervals: PR 186 ms. QRS 98 ms. QTc 429 ms. ST segment and T wave changes: No evidence of acute T wave abnormalities or significant ST segment elevation or depression. Evidence of an age undetermined septal infarct noted.  Comparison: Similar to previous tracing obtained on 02/12/2023   IMAGING / PROCEDURES: TRANSTHORACIC ECHOCARDIOGRAM performed on 06/02/2022 Normal left ventricular systolic function with an EF of 55-60% No regional wall motion abnormalities Left ventricular diastolic doppler parameters indeterminate Severe LAE Normal right ventricular systolic function Moderately elevated RVSP of 37.9 mmHg Mild to moderate MR Mild aortic sclerosis Mild AR Normal transvalvular gradients; no valvular stenosis No pericardial effusion Borderline ascending aorta dilatation measuring 38 mm  DG KNEE COMPLETE 4 VIEWS RIGHT performed on 11/09/2022 No acute osseous abnormality of the right knee joint. Moderate degenerative osteoarthritic changes  CT SHOULDER LEFT WO CONTRAST performed on 10/09/2022 Postsurgical changes status post rotator cuff repair and possible bicipital tenodesis. Advanced fatty atrophy of the supraspinatus and subscapularis muscles consistent with chronic rotator cuff tear. Moderate glenohumeral and acromioclavicular degenerative changes. No acute osseous findings. Fluid within the subacromial-subdeltoid and subcoracoid bursa. Aortic atherosclerosis   MR LUMBAR SPINE W WO CONTRAST performed on 05/20/2021 Diffuse lumbar spine spondylosis as described above. No acute osseous injury of the lumbar spine. No aggressive osseous lesions to suggest metastatic disease  CT CARDIAC SCORING performed on 11/06/2014 Coronary calcium  score of 224 (all in the proximal LAD).   This was the 74th percentile for age and sex matched control  Impression and Plan:  [name] has been referred for pre-anesthesia review and clearance prior to him undergoing the planned anesthetic and procedural courses. Available labs, pertinent testing, and imaging results were personally reviewed by me in preparation for upcoming operative/procedural course. [facility] medical record has been updated following extensive record review and patient interview with PAT staff.   This patient has been appropriately cleared by cardiology with an overall ACCEPTABLE risk of experiencing significant perioperative cardiovascular complications. Based on clinical review performed today (02/19/23), barring any significant acute changes in the patient's overall condition, it is anticipated that he will be able to proceed with the planned surgical intervention. Any acute changes in clinical condition may necessitate his procedure being postponed and/or cancelled. Patient will meet with anesthesia team (MD and/or CRNA) on the day of his procedure for preoperative evaluation/assessment. Questions regarding anesthetic course will be fielded at that time.   Pre-surgical instructions were reviewed with the patient during his PAT appointment, and questions were fielded to satisfaction by PAT clinical staff. He has been instructed on which medications that he will need to hold prior to surgery, as well as the ones that have been deemed safe/appropriate to take on the day of his procedure. As part of the general education provided by PAT, patient made aware both verbally and in writing, that he would need to abstain from the use of any illegal substances during his perioperative course. He was advised that failure to follow the provided instructions could necessitate case cancellation or result in serious perioperative complications up to and including death. Patient encouraged to  contact PAT and/or his surgeon's office to discuss any questions or concerns that may arise prior to surgery; verbalized understanding.   Quentin Mulling, MSN, APRN, FNP-C, CEN [facility]  Perioperative Services Nurse Practitioner Phone: [phone] Fax: [phone] 02/19/23 9:12 AM  NOTE: This note has been prepared using Dragon dictation software. Despite my best ability to proofread, there is always the potential that unintentional transcriptional errors may still occur from this process.

## 2023-02-20 ENCOUNTER — Ambulatory Visit: Payer: Medicare Other

## 2023-02-20 ENCOUNTER — Encounter
Admission: RE | Disposition: A | Discharge status: Home / Self Care | Payer: Self-pay | Source: Ambulatory Visit | Attending: Orthopedic Surgery

## 2023-02-20 ENCOUNTER — Encounter: Payer: Self-pay | Admitting: Orthopedic Surgery

## 2023-02-20 ENCOUNTER — Ambulatory Visit
Admission: RE | Admit: 2023-02-20 | Discharge: 2023-02-20 | Disposition: A | Discharge status: Home / Self Care | Payer: Medicare Other | Source: Ambulatory Visit | Attending: Orthopedic Surgery | Admitting: Orthopedic Surgery

## 2023-02-20 ENCOUNTER — Ambulatory Visit: Payer: Medicare Other | Admitting: Urgent Care

## 2023-02-20 ENCOUNTER — Other Ambulatory Visit: Payer: Self-pay

## 2023-02-20 DIAGNOSIS — G8918 Other acute postprocedural pain: Secondary | ICD-10-CM | POA: Diagnosis not present

## 2023-02-20 DIAGNOSIS — Z6841 Body Mass Index (BMI) 40.0 and over, adult: Secondary | ICD-10-CM | POA: Diagnosis not present

## 2023-02-20 DIAGNOSIS — I110 Hypertensive heart disease with heart failure: Secondary | ICD-10-CM | POA: Insufficient documentation

## 2023-02-20 DIAGNOSIS — I700 Atherosclerosis of aorta: Secondary | ICD-10-CM | POA: Insufficient documentation

## 2023-02-20 DIAGNOSIS — M7522 Bicipital tendinitis, left shoulder: Secondary | ICD-10-CM | POA: Diagnosis not present

## 2023-02-20 DIAGNOSIS — Z87891 Personal history of nicotine dependence: Secondary | ICD-10-CM | POA: Diagnosis not present

## 2023-02-20 DIAGNOSIS — M75122 Complete rotator cuff tear or rupture of left shoulder, not specified as traumatic: Secondary | ICD-10-CM | POA: Diagnosis not present

## 2023-02-20 DIAGNOSIS — Z96612 Presence of left artificial shoulder joint: Secondary | ICD-10-CM | POA: Diagnosis not present

## 2023-02-20 DIAGNOSIS — I480 Paroxysmal atrial fibrillation: Secondary | ICD-10-CM | POA: Insufficient documentation

## 2023-02-20 DIAGNOSIS — I2510 Atherosclerotic heart disease of native coronary artery without angina pectoris: Secondary | ICD-10-CM | POA: Diagnosis not present

## 2023-02-20 DIAGNOSIS — G894 Chronic pain syndrome: Secondary | ICD-10-CM | POA: Insufficient documentation

## 2023-02-20 DIAGNOSIS — I5032 Chronic diastolic (congestive) heart failure: Secondary | ICD-10-CM | POA: Insufficient documentation

## 2023-02-20 DIAGNOSIS — M67811 Other specified disorders of synovium, right shoulder: Secondary | ICD-10-CM | POA: Diagnosis not present

## 2023-02-20 HISTORY — DX: Long term (current) use of anticoagulants: Z79.01

## 2023-02-20 HISTORY — DX: Unilateral inguinal hernia, without obstruction or gangrene, not specified as recurrent: K40.90

## 2023-02-20 HISTORY — PX: REVERSE SHOULDER ARTHROPLASTY: SHX5054

## 2023-02-20 HISTORY — DX: Unspecified diastolic (congestive) heart failure: I50.30

## 2023-02-20 SURGERY — ARTHROPLASTY, SHOULDER, TOTAL, REVERSE
Anesthesia: General | Site: Shoulder | Laterality: Left

## 2023-02-20 MED ORDER — MIDAZOLAM HCL 2 MG/2ML IJ SOLN
1.0000 mg | INTRAMUSCULAR | Status: AC | PRN
  Administered 2023-02-20 (×2): 1 mg via INTRAVENOUS

## 2023-02-20 MED ORDER — OXYCODONE HCL 5 MG PO TABS
5.0000 mg | ORAL_TABLET | Freq: Once | ORAL | Status: AC | PRN
  Administered 2023-02-20: 5 mg via ORAL

## 2023-02-20 MED ORDER — DEXAMETHASONE SODIUM PHOSPHATE 10 MG/ML IJ SOLN
INTRAMUSCULAR | Status: DC | PRN
  Administered 2023-02-20: 5 mg via INTRAVENOUS

## 2023-02-20 MED ORDER — VANCOMYCIN HCL 1000 MG IV SOLR
INTRAVENOUS | Status: AC
  Filled 2023-02-20: qty 20

## 2023-02-20 MED ORDER — SODIUM CHLORIDE 0.9 % IR SOLN
Status: DC | PRN
  Administered 2023-02-20: 3000 mL

## 2023-02-20 MED ORDER — DROPERIDOL 2.5 MG/ML IJ SOLN: 0.6250 mg | Freq: Once | INTRAMUSCULAR | Status: DC | PRN

## 2023-02-20 MED ORDER — TRANEXAMIC ACID-NACL 1000-0.7 MG/100ML-% IV SOLN
1000.0000 mg | INTRAVENOUS | Status: AC
  Administered 2023-02-20: 1000 mg via INTRAVENOUS

## 2023-02-20 MED ORDER — BUPIVACAINE HCL (PF) 0.5 % IJ SOLN
INTRAMUSCULAR | Status: DC | PRN
  Administered 2023-02-20: 10 mL via PERINEURAL

## 2023-02-20 MED ORDER — LIDOCAINE HCL (PF) 1 % IJ SOLN
INTRAMUSCULAR | Status: AC
  Filled 2023-02-20: qty 5

## 2023-02-20 MED ORDER — LIDOCAINE HCL (CARDIAC) PF 100 MG/5ML IV SOSY
PREFILLED_SYRINGE | INTRAVENOUS | Status: DC | PRN
  Administered 2023-02-20: 20 mg via INTRAVENOUS

## 2023-02-20 MED ORDER — TRANEXAMIC ACID-NACL 1000-0.7 MG/100ML-% IV SOLN
INTRAVENOUS | Status: AC
  Filled 2023-02-20: qty 100

## 2023-02-20 MED ORDER — SUGAMMADEX SODIUM 200 MG/2ML IV SOLN
INTRAVENOUS | Status: DC | PRN
  Administered 2023-02-20: 200 mg via INTRAVENOUS

## 2023-02-20 MED ORDER — 0.9 % SODIUM CHLORIDE (POUR BTL) OPTIME
TOPICAL | Status: DC | PRN
  Administered 2023-02-20: 500 mL

## 2023-02-20 MED ORDER — CEFAZOLIN SODIUM-DEXTROSE 3-4 GM/150ML-% IV SOLN
3.0000 g | Freq: Once | INTRAVENOUS | Status: AC
  Administered 2023-02-20: 3 g via INTRAVENOUS
  Filled 2023-02-20: qty 150

## 2023-02-20 MED ORDER — ONDANSETRON 4 MG PO TBDP: 4.0000 mg | ORAL_TABLET | Freq: Three times a day (TID) | ORAL | 0 refills | Status: DC | PRN

## 2023-02-20 MED ORDER — LIDOCAINE HCL (PF) 2 % IJ SOLN
INTRAMUSCULAR | Status: AC
  Filled 2023-02-20: qty 5

## 2023-02-20 MED ORDER — ORAL CARE MOUTH RINSE: 15.0000 mL | Freq: Once | OROMUCOSAL | Status: AC

## 2023-02-20 MED ORDER — ONDANSETRON HCL 4 MG/2ML IJ SOLN
INTRAMUSCULAR | Status: AC
  Filled 2023-02-20: qty 2

## 2023-02-20 MED ORDER — EPHEDRINE SULFATE-NACL 50-0.9 MG/10ML-% IV SOSY
PREFILLED_SYRINGE | INTRAVENOUS | Status: DC | PRN
  Administered 2023-02-20 (×2): 10 mg via INTRAVENOUS

## 2023-02-20 MED ORDER — PHENYLEPHRINE 80 MCG/ML (10ML) SYRINGE FOR IV PUSH (FOR BLOOD PRESSURE SUPPORT)
PREFILLED_SYRINGE | INTRAVENOUS | Status: DC | PRN
  Administered 2023-02-20: 160 ug via INTRAVENOUS

## 2023-02-20 MED ORDER — OXYCODONE HCL 5 MG PO TABS: 5.0000 mg | ORAL_TABLET | ORAL | 0 refills | Status: DC | PRN

## 2023-02-20 MED ORDER — CHLORHEXIDINE GLUCONATE 0.12 % MT SOLN
15.0000 mL | Freq: Once | OROMUCOSAL | Status: AC
  Administered 2023-02-20: 15 mL via OROMUCOSAL

## 2023-02-20 MED ORDER — LIDOCAINE HCL (PF) 1 % IJ SOLN
INTRAMUSCULAR | Status: DC | PRN
  Administered 2023-02-20: 2 mL via SUBCUTANEOUS

## 2023-02-20 MED ORDER — MIDAZOLAM HCL 2 MG/2ML IJ SOLN
INTRAMUSCULAR | Status: AC
  Filled 2023-02-20: qty 2

## 2023-02-20 MED ORDER — FENTANYL CITRATE (PF) 100 MCG/2ML IJ SOLN
INTRAMUSCULAR | Status: AC
  Filled 2023-02-20: qty 2

## 2023-02-20 MED ORDER — LACTATED RINGERS IV SOLN: INTRAVENOUS | Status: DC

## 2023-02-20 MED ORDER — CEFAZOLIN IN SODIUM CHLORIDE 3-0.9 GM/100ML-% IV SOLN
3.0000 g | INTRAVENOUS | Status: AC
  Administered 2023-02-20: 3 g via INTRAVENOUS
  Filled 2023-02-20: qty 100

## 2023-02-20 MED ORDER — DEXAMETHASONE SODIUM PHOSPHATE 10 MG/ML IJ SOLN
INTRAMUSCULAR | Status: AC
  Filled 2023-02-20: qty 1

## 2023-02-20 MED ORDER — PROPOFOL 10 MG/ML IV BOLUS
INTRAVENOUS | Status: AC
  Filled 2023-02-20: qty 20

## 2023-02-20 MED ORDER — ROCURONIUM BROMIDE 100 MG/10ML IV SOLN
INTRAVENOUS | Status: DC | PRN
  Administered 2023-02-20: 10 mg via INTRAVENOUS
  Administered 2023-02-20: 60 mg via INTRAVENOUS

## 2023-02-20 MED ORDER — PROPOFOL 10 MG/ML IV BOLUS
INTRAVENOUS | Status: DC | PRN
  Administered 2023-02-20: 200 mg via INTRAVENOUS

## 2023-02-20 MED ORDER — FENTANYL CITRATE (PF) 100 MCG/2ML IJ SOLN
INTRAMUSCULAR | Status: DC | PRN
  Administered 2023-02-20 (×2): 50 ug via INTRAVENOUS

## 2023-02-20 MED ORDER — VANCOMYCIN HCL 1000 MG IV SOLR
INTRAVENOUS | Status: DC | PRN
  Administered 2023-02-20: 1000 mg via TOPICAL

## 2023-02-20 MED ORDER — BUPIVACAINE LIPOSOME 1.3 % IJ SUSP
INTRAMUSCULAR | Status: DC | PRN
  Administered 2023-02-20: 20 mL via PERINEURAL

## 2023-02-20 MED ORDER — ONDANSETRON HCL 4 MG/2ML IJ SOLN
INTRAMUSCULAR | Status: DC | PRN
  Administered 2023-02-20: 4 mg via INTRAVENOUS

## 2023-02-20 MED ORDER — BUPIVACAINE HCL (PF) 0.5 % IJ SOLN
INTRAMUSCULAR | Status: AC
  Filled 2023-02-20: qty 10

## 2023-02-20 MED ORDER — ACETAMINOPHEN 10 MG/ML IV SOLN: 1000.0000 mg | Freq: Once | INTRAVENOUS | Status: DC | PRN

## 2023-02-20 MED ORDER — OXYCODONE HCL 5 MG/5ML PO SOLN: 5.0000 mg | Freq: Once | ORAL | Status: AC | PRN

## 2023-02-20 MED ORDER — ROCURONIUM BROMIDE 10 MG/ML (PF) SYRINGE
PREFILLED_SYRINGE | INTRAVENOUS | Status: AC
  Filled 2023-02-20: qty 10

## 2023-02-20 MED ORDER — BUPIVACAINE LIPOSOME 1.3 % IJ SUSP
INTRAMUSCULAR | Status: AC
  Filled 2023-02-20: qty 20

## 2023-02-20 MED ORDER — OXYCODONE HCL 5 MG PO TABS
ORAL_TABLET | ORAL | Status: AC
  Filled 2023-02-20: qty 1

## 2023-02-20 MED ORDER — ACETAMINOPHEN 500 MG PO TABS: 1000.0000 mg | ORAL_TABLET | Freq: Three times a day (TID) | ORAL | 2 refills | Status: DC

## 2023-02-20 MED ORDER — LACTATED RINGERS IV SOLN: INTRAVENOUS | Status: DC | PRN

## 2023-02-20 MED ORDER — CHLORHEXIDINE GLUCONATE 0.12 % MT SOLN
OROMUCOSAL | Status: AC
  Filled 2023-02-20: qty 15

## 2023-02-20 MED ORDER — FENTANYL CITRATE (PF) 100 MCG/2ML IJ SOLN: 25.0000 ug | INTRAMUSCULAR | Status: DC | PRN

## 2023-02-20 SURGICAL SUPPLY — 82 items
3.5mm peripheral drill bit ×1
6.5mmtap, 2.4mm cannulated ×1
BASEPLATE P2 COATD GLND 6.5X30 (Shoulder) IMPLANT
BIT DRILL GUIDE PATIENT MATCH (MISCELLANEOUS) IMPLANT
BLADE SAGITTAL WIDE XTHICK NO (BLADE) ×1 IMPLANT
CHLORAPREP W/TINT 26 (MISCELLANEOUS) ×1 IMPLANT
CNTNR URN SCR LID CUP LEK RST (MISCELLANEOUS) IMPLANT
COOLER POLAR GLACIER W/PUMP (MISCELLANEOUS) ×1 IMPLANT
DERMABOND ADVANCED .7 DNX12 (GAUZE/BANDAGES/DRESSINGS) IMPLANT
DRAPE INCISE IOBAN 66X45 STRL (DRAPES) ×2 IMPLANT
DRAPE SHEET LG 3/4 BI-LAMINATE (DRAPES) ×2 IMPLANT
DRAPE TABLE BACK 80X90 (DRAPES) ×1 IMPLANT
DRAPE U-SHAPE 47X51 STRL (DRAPES) ×1 IMPLANT
DRILL GLEN ALTIVATE 3.5 (DRILL) IMPLANT
DRILL GUIDE PATIENT MATCH (MISCELLANEOUS) ×1
DRSG OPSITE POSTOP 3X4 (GAUZE/BANDAGES/DRESSINGS) IMPLANT
DRSG OPSITE POSTOP 4X6 (GAUZE/BANDAGES/DRESSINGS) IMPLANT
DRSG OPSITE POSTOP 4X8 (GAUZE/BANDAGES/DRESSINGS) IMPLANT
DRSG TEGADERM 2-3/8X2-3/4 SM (GAUZE/BANDAGES/DRESSINGS) IMPLANT
ELECT REM PT RETURN 9FT ADLT (ELECTROSURGICAL) ×1
ELECTRODE REM PT RTRN 9FT ADLT (ELECTROSURGICAL) ×1 IMPLANT
EVACUATOR 1/8 PVC DRAIN (DRAIN) IMPLANT
GAUZE SPONGE 2X2 STRL 8-PLY (GAUZE/BANDAGES/DRESSINGS) IMPLANT
GAUZE XEROFORM 1X8 LF (GAUZE/BANDAGES/DRESSINGS) IMPLANT
GLOVE BIOGEL PI IND STRL 8 (GLOVE) ×2 IMPLANT
GLOVE PI ULTRA LF STRL 7.5 (GLOVE) ×2 IMPLANT
GLOVE SURG ORTHO 8.0 STRL STRW (GLOVE) ×2 IMPLANT
GLOVE SURG SYN 8.0 (GLOVE) ×1 IMPLANT
GLOVE SURG SYN 8.0 PF PI (GLOVE) ×1 IMPLANT
GOWN STRL REUS W/ TWL LRG LVL3 (GOWN DISPOSABLE) ×2 IMPLANT
GOWN STRL REUS W/ TWL XL LVL3 (GOWN DISPOSABLE) ×1 IMPLANT
GUIDE WIRE ALTIVATE 2.4X228 SL (WIRE) IMPLANT
HEAD GLENOID W/SCREW 32MM (Shoulder) IMPLANT
HOOD PEEL AWAY T7 (MISCELLANEOUS) ×2 IMPLANT
INSERT EPOLY STND HUMERUS 36MM (Shoulder) ×1 IMPLANT
INSERT EPOLYSTD HUMERUS 36MM (Shoulder) IMPLANT
KIT STABILIZATION SHOULDER (MISCELLANEOUS) ×1 IMPLANT
MANIFOLD NEPTUNE II (INSTRUMENTS) ×1 IMPLANT
MASK FACE SPIDER DISP (MASK) ×1 IMPLANT
MAT ABSORB FLUID 56X50 GRAY (MISCELLANEOUS) ×1 IMPLANT
NDL REVERSE CUT 1/2 CRC (NEEDLE) IMPLANT
NDL SPNL 20GX3.5 QUINCKE YW (NEEDLE) IMPLANT
NEEDLE REVERSE CUT 1/2 CRC (NEEDLE) IMPLANT
NEEDLE SPNL 20GX3.5 QUINCKE YW (NEEDLE) IMPLANT
NS IRRIG 1000ML POUR BTL (IV SOLUTION) ×1 IMPLANT
P2 COATDE GLNOID BSEPLT 6.5X30 (Shoulder) ×1 IMPLANT
PACK ARTHROSCOPY SHOULDER (MISCELLANEOUS) ×1 IMPLANT
PAD WRAPON POLAR SHDR XLG (MISCELLANEOUS) ×1 IMPLANT
PULSAVAC PLUS IRRIG FAN TIP (DISPOSABLE) ×1
SCREW MONOBLOCK SPACER RETAIN (Screw) IMPLANT
SCREW PERI ALTIVATE REV 14 (Screw) IMPLANT
SCREW PERI ALTIVATE REV 26 (Screw) IMPLANT
SCREW PERI ALTIVATE REV 34 (Screw) IMPLANT
SLING ULTRA II LG (MISCELLANEOUS) IMPLANT
SLING ULTRA II M (MISCELLANEOUS) IMPLANT
SPONGE T-LAP 18X18 ~~LOC~~+RFID (SPONGE) ×1 IMPLANT
STAPLER SKIN PROX 35W (STAPLE) IMPLANT
STEM HUM STD SHORT 18X48 (Stem) IMPLANT
STRAP SAFETY 5IN WIDE (MISCELLANEOUS) ×1 IMPLANT
SUT BONE WAX W31G (SUTURE) IMPLANT
SUT ETHIBOND 5-0 MS/4 CCS GRN (SUTURE) ×1
SUT FIBERWIRE #2 38 BLUE 1/2 (SUTURE) ×1
SUT MNCRL AB 4-0 PS2 18 (SUTURE) IMPLANT
SUT PROLENE 6 0 P 1 18 (SUTURE) IMPLANT
SUT TICRON 2-0 30IN 311381 (SUTURE) ×2 IMPLANT
SUT VIC AB 0 CT1 36 (SUTURE) ×1 IMPLANT
SUT VIC AB 2-0 CT2 27 (SUTURE) ×2 IMPLANT
SUT XBRAID 1.4 BLK/WHT (SUTURE) IMPLANT
SUT XBRAID 1.4 BLUE (SUTURE) IMPLANT
SUT XBRAID 1.4 WHITE/BLUE (SUTURE) IMPLANT
SUT XBRAID 2 BLACK/BLUE (SUTURE) IMPLANT
SUTURE ETHBND 5-0 MS/4 CCS GRN (SUTURE) ×1 IMPLANT
SUTURE FIBERWR #2 38 BLUE 1/2 (SUTURE) ×1 IMPLANT
SYR 30ML LL (SYRINGE) IMPLANT
TAP CANN GLEN 6.5 (TAP) IMPLANT
TIP FAN IRRIG PULSAVAC PLUS (DISPOSABLE) ×1 IMPLANT
TRAP FLUID SMOKE EVACUATOR (MISCELLANEOUS) ×1 IMPLANT
WATER STERILE IRR 500ML POUR (IV SOLUTION) ×1 IMPLANT
WRAPON POLAR PAD SHDR XLG (MISCELLANEOUS) ×1
altivate reverse torx peripheral screw, 14mm ×1 IMPLANT
altivate reverse torx peripheral screw, 26mm ×1 IMPLANT
altivate reverse torx peripheral screw, 34mm ×2 IMPLANT

## 2023-02-20 NOTE — Discharge Instructions (Addendum)
Wayne Albee, MD  [facility]  Phone: [phone]  Fax: [phone]   Discharge Instructions after Reverse Shoulder Replacement    1. Activity/Sling: You are to be non-weight bearing on operative extremity. A sling/shoulder immobilizer has been provided for you. Only remove the sling to perform elbow, wrist, and hand RoM exercises and hygiene/dressing. Active reaching and lifting are not permitted. You will be given further instructions on sling use at your first physical therapy visit and postoperative visit with Dr. Allena Katz.   2. Dressings: Dressing may be removed at 1st physical therapy visit (~3-4 days after surgery). Afterwards, you may either leave open to air (if no drainage) or cover with dry, sterile dressing. If you have steri-strips on your wound, please do not remove them. They will fall off on their own. You may shower 5 days after surgery. Please pat incision dry. Do not rub or place any shear forces across incision. If there is drainage or any opening of incision after 5 days, please notify our offices immediately.    3. Driving:  Plan on not driving for six weeks. Please note that you are advised NOT to drive while taking narcotic pain medications as you may be impaired and unsafe to drive.   4. Medications:  - You have been provided a prescription for narcotic pain medicine (usually oxycodone). After surgery, take 1-2 narcotic tablets every 4 hours if needed for severe pain. Please start this as soon as you begin to start having pain (if you received a nerve block, start taking as soon as this wears off).  - A prescription for anti-nausea medication will be provided in case the narcotic medicine causes nausea - take 1 tablet every 6 hours only if nauseated.  - Resume normal dosing of Xarelto the day after surgery -Take tylenol 1000mg  (2 Extra strength or 3 regular strength tablets) every 8 hours for pain. This will reduce the amount of narcotic medication needed. May stop  tylenol when you are having minimal pain. - Take a stool softener (Colace, Dulcolax or Senakot) if you are using narcotic pain medications to help with constipation that is associated with narcotic use.   If you are taking prescription medication for anxiety, depression, insomnia, muscle spasm, chronic pain, or for attention deficit disorder you are advised that you are at a higher risk of adverse effects with use of narcotics post-op, including narcotic addiction/dependence, depressed breathing, death. If you use non-prescribed substances: alcohol, marijuana, cocaine, heroin, methamphetamines, etc., you are at a higher risk of adverse effects with use of narcotics post-op, including narcotic addiction/dependence, depressed breathing, death. You are advised that taking > 50 morphine milligram equivalents (MME) of narcotic pain medication per day results in twice the risk of overdose or death. For your prescription provided: oxycodone 5 mg - taking more than 6 tablets per day after the first few days of surgery.   5. Physical Therapy: 1-2 times per week for ~12 weeks. Therapy typically starts on post operative Day 3 or 4. You have been provided an order for physical therapy. The therapist will provide home exercises. Please contact our offices if this appointment has not been scheduled.    6. Work: May do light duty/desk job in approximately 2 weeks when off of narcotics, pain is well-controlled, and swelling has decreased if able to function with one arm in sling. Full work may take 6 weeks if light motions and function of both arms is required. Lifting jobs may require 12 weeks.   7.  Post-Op Appointments: Your first post-op appointment will be with Dr. Allena Katz in approximately 2 weeks time.    If you find that they have not been scheduled please call the Orthopaedic Appointment front desk at [phone].                               Wayne Albee, MD [facility] Phone: [phone] Fax: [phone]   REVERSE SHOULDER ARTHROPLASTY REHAB GUIDELINES   These guidelines should be tailored to individual patients based on their rehab goals, age, precautions, quality of repair, etc.  Progression should be based on patient progress and approval by the referring physician.  PHASE 1 - Day 1 through Week 2  GENERAL GUIDELINES AND PRECAUTIONS Sling wear 24/7 except during grooming and home exercises (3 to 5 times daily) Avoid shoulder extension such that the arm is posterior the frontal plane.  When patients recline, a pillow should be placed behind the upper arm and sling should be on.  They should be advised to always be able to see the elbow Avoid combined IR/ADD/EXT, such as hand behind back to prevent dislocation Avoid combined IR and ADD such as reaching across the chest to prevent dislocation No AROM No submersion in pool/water for 4 weeks No weight bearing through operative arm (as in transfers, walker use, etc.)  GOALS Maintain integrity of joint replacement; protect soft tissue healing Increase PROM for elevation to 120 and ER to 30 (will remain the goal for first 6 weeks) Optimize distal UE circulation and muscle activity (elbow, wrist and hand) Instruct in use of sling for proper fit, polar care device for ice application after HEP, signs/symptoms of infection  EXERCISES Active elbow, wrist and hand Passive forward elevation in scapular plane to 90-120 max motion; ER in scapular plane to 30 Active scapular retraction with arms resting in neutral position  CRITERIA TO PROGRESS TO PHASE 2 Low pain (less than 3/10) with shoulder PROM Healing of incision without signs of infection Clearance by MD to advance after 2 week MD check up  PHASE 2 - 2 weeks - 6 weeks  GENERAL GUIDELINES AND PRECAUTIONS Sling may be removed while at home; worn in community without abduction pillow May use arm for light activities of daily living (such as  feeding, brushing teeth, dressing.) with elbow near  the side of the body  and arm in front of the body- no active lifting of the arm May submerge in water (tub, pool, [place], etc.) after 4 weeks Continue to avoid WBing through the operative arm Continue to avoid combined IR/EXT/ADD (hand behind the back) and IR/ADD  (reaching across chest) for dislocation precautions  GOALS  Achieve passive elevation to 120 and ER to 30  Low (less than 3/10) to no pain  Ability to fire all heads of the deltoid  EXERCISES May discontinue grip, and active elbow and wrist exercises since using the arm in ADL's  with sling removed around the home Continue passive elevation to 120 and ER to 30, both in scapular plane with arm supported on table top Add submaximal isometrics, pain free effort, for all functional heads of deltoid (anterior, posterior, middle)  Ensure that with posterior deltoid isometric the shoulder does not move into extension and the arm remains anterior the frontal plane At 4 weeks:  begin to place arm in balanced position of 90 deg elevation in supine; when patient able to hold this position with ease, may  begin reverse pendulums clockwise and counterclockwise  CRITERIA TO PROGRESS TO PHASE 3 Passive forward elevation in scapular plane to 120; passive ER in scapular plane to 30 Ability to fire isometrically all heads of the deltoid muscle without pain Ability to place and hold the arm in balanced position (90 deg elevation in supine)  PHASE 3 - 6 weeks to 3 months  GENERAL GUIDELINES AND PRECAUTIONS Discontinue use of sling Avoid forcing end range motion in any direction to prevent dislocation  May advance use of the arm actively in ADL's without being restricted to arm by the side of the body, however, avoid heavy lifting and sports (forever!) May initiate functional IR behind the back gently NO UPPER BODY ERGOMETER   GOALS Optimize PROM for elevation and ER in scapular plane with  realistic expectation that max  mobility for elevation is usually around 145-160 passively; ER 40 to 50 passively; functional IR to L1 Recover AROM to approach as close to PROM available as possible; may expect 135-150 deg active elevation; 30 deg active ER; active functional IR to L1 Establish dynamic stability of the shoulder with deltoid and periscapular muscle gradual strengthening  EXERCISES Forward elevation in scapular plane active progression: supine to incline, to vertical; short to long lever arm Balanced position long lever arm AROM Active ER/IR with arm at side Scapular retraction with light band resistance Functional IR with hand slide up back - very gentle and gradual NO UPPER BODY ERGOMETER     CRITERIA TO PROGRESS TO PHASE 4  AROM equals/approaches PROM with good mechanics for elevation   No pain  Higher level demand on shoulder than ADL functions   PHASE 4 12 months and beyond  GENERAL GUIDELINES AND PRECAUTIONS No heavy lifting and no overhead sports No heavy pushing activity Gradually increase strength of deltoid and scapular stabilizers; also the rotator cuff if present with weights not to exceed 5 lbs NO UPPER BODY ERGOMETER   GOALS  Optimize functional use of the operative UE to meet the desired demands  Gradual increase in deltoid, scapular muscle, and rotator cuff strength  Pain free functional activities   EXERCISES Add light hand weights for deltoid up to and not to exceed 3 lbs for anterior and posterior with long arm lift against gravity; elbow bent to 90 deg for abduction in scapular plane Theraband progression for extension to hip with scapular depression/retraction Theraband progression for serratus anterior punches in supine; avoid wall, incline or prone pressups for serratus anterior End range stretching gently without forceful overpressure in all planes (elevation in scapular plane, ER in scapular plane, functional IR) with stretching done for  life as part of a daily routine NO UPPER BODY ERGOMETER     CRITERIA FOR DISCHARGE FROM SKILLED PHYSICAL THERAPY  Pain free AROM for shoulder elevation (expect around 135-150)  Functional strength for all ADL's, work tasks, and hobbies approved by Careers adviser  Independence with home maintenance program   NOTES: 1. With proper exercise, motion, strength, and function continue to improve even after one year. 2. The complication rate after surgery is 5 - 8%. Complications include infection, fracture, heterotopic bone formation, nerve injury, instability, rotator cuff tear, and tuberosity nonunion. Please look for clinical signs, unusual symptoms, or lack of progress with therapy and report those to Dr. Allena Katz. Prefer more communication than less.  3. The therapy plan above only serves as a guide. Please be aware of specific individualized patient instructions as written on the prescription or through discussions with  the surgeon. 4. Please call Dr. Allena Katz if you have any specific questions or concerns [phone]  POLAR CARE INFORMATION  [email or web address]  How to use [employer] Polar Care [facility] Therapy System?  YouTube   ShippingScam.co.uk  OPERATING INSTRUCTIONS  Start the product With dry hands, connect the transformer to the electrical connection located on the top of the cooler. Next, plug the transformer into an appropriate electrical outlet. The unit will automatically start running at this point.  To stop the pump, disconnect electrical power.  Unplug to stop the product when not in use. Unplugging the Polar Care unit turns it off. Always unplug immediately after use. Never leave it plugged in while unattended. Remove pad.    FIRST ADD WATER TO FILL LINE, THEN ICE---Replace ice when existing ice is almost melted  1 Discuss Treatment with your Licensed Health Care Practitioner and Use Only as Prescribed 2 Apply Insulation Barrier & Cold Therapy Pad 3 Check for  Moisture 4 Inspect Skin Regularly  Tips and Trouble Shooting Usage Tips 1. Use cubed or chunked ice for optimal performance. 2. It is recommended to drain the Pad between uses. To drain the pad, hold the Pad upright with the hose pointed toward the ground. Depress the black plunger and allow water to drain out. 3. You may disconnect the Pad from the unit without removing the pad from the affected area by depressing the silver tabs on the hose coupling and gently pulling the hoses apart. The Pad and unit will seal itself and will not leak. Note: Some dripping during release is normal. 4. DO NOT RUN PUMP WITHOUT WATER! The pump in this unit is designed to run with water. Running the unit without water will cause permanent damage to the pump. 5. Unplug unit before removing lid.  TROUBLESHOOTING GUIDE Pump not running, Water not flowing to the pad, Pad is not getting cold 1. Make sure the transformer is plugged into the wall outlet. 2. Confirm that the ice and water are filled to the indicated levels. 3. Make sure there are no kinks in the pad. 4. Gently pull on the blue tube to make sure the tube/pad junction is straight. 5. Remove the pad from the treatment site and ll it while the pad is lying at; then reapply. 6. Confirm that the pad couplings are securely attached to the unit. Listen for the double clicks (Figure 1) to confirm the pad couplings are securely attached.  Leaks    Note: Some condensation on the lines, controller, and pads is unavoidable, especially in warmer climates. 1. If using a [employer] Polar Care Cold Therapy unit with a detachable Cold Therapy Pad, and a leak exists (other than condensation on the lines) disconnect the pad couplings. Make sure the silver tabs on the couplings are depressed before reconnecting the pad to the pump hose; then confirm both sides of the coupling are properly clicked in. 2. If the coupling continues to leak or a leak is detected in the pad itself, stop  using it and call [employer] Customer Care at [phone].  Cleaning After use, empty and dry the unit with a soft cloth. Warm water and mild detergent may be used occasionally to clean the pump and tubes.  WARNING: The Polar Care Cube can be cold enough to cause serious injury, including full skin necrosis. Follow these Operating Instructions, and carefully read the Product Insert (see pouch on side of unit) and the Cold Therapy Pad Fitting Instructions (provided with each Cold Therapy  Pad) prior to use.  SHOULDER SLING IMMOBILIZER   VIDEO Slingshot 2 Shoulder Brace Application - YouTube ---https://www.porter.info/  INSTRUCTIONS While supporting the injured arm, slide the forearm into the sling. Wrap the adjustable shoulder strap around the neck and shoulders and attach the strap end to the sling using  the "alligator strap tab."  Adjust the shoulder strap to the required length. Position the shoulder pad behind the neck. To secure the shoulder pad location (optional), pull the shoulder strap away from the shoulder pad, unfold the hook material on the top of the pad, then press the shoulder strap back onto the hook material to secure the pad in place. Attach the closure strap across the open top of the sling. Position the strap so that it holds the arm securely in the sling. Next, attach the thumb strap to the open end of the sling between the thumb and fingers. After sling has been fit, it may be easily removed and reapplied using the quick release buckle on shoulder strap. If a neutral pillow or 15 abduction pillow is included, place the pillow at the waistline. Attach the sling to the pillow, lining up hook material on the pillow with the loop on sling. Adjust the waist strap to fit.  If waist strap is too long, cut it to fit. Use the small piece of double sided hook material (located on top of the pillow) to secure the strap end. Place the double sided hook material on the  inside of the cut strap end and secure it to the waist strap.     If no pillow is included, attach the waist strap to the sling and adjust to fit.    Washing Instructions: Straps and sling must be removed and cleaned regularly depending on your activity level and perspiration. Hand wash straps and sling in cold water with mild detergent, rinse, air dry        Interscalene Nerve Block with Exparel   For your surgery you have received an Interscalene Nerve Block with Exparel. Nerve Blocks affect many types of nerves, including nerves that control movement, pain and normal sensation.  You may experience feelings such as numbness, tingling, heaviness, weakness or the inability to move your arm or the feeling or sensation that your arm has "fallen asleep". A nerve block with Exparel can last up to 5 days.  Usually the weakness wears off first.  The tingling and heaviness usually wear off next.  Finally you may start to notice pain.  Keep in mind that this may occur in any order.  Once a nerve block starts to wear off it is usually completely gone within 60 minutes. ISNB may cause mild shortness of breath, a hoarse voice, blurry vision, unequal pupils, or drooping of the face on the same side as the nerve block.  These symptoms will usually resolve with the numbness.  Very rarely the procedure itself can cause mild seizures. If needed, your surgeon will give you a prescription for pain medication.  It will take about 60 minutes for the oral pain medication to become fully effective.  So, it is recommended that you start taking this medication before the nerve block first begins to wear off, or when you first begin to feel discomfort. Take your pain medication only as prescribed.  Pain medication can cause sedation and decrease your breathing if you take more than you need for the level of pain that you have. Nausea is a common side effect of many pain  medications.  You may want to eat something before  taking your pain medicine to prevent nausea. After an Interscalene nerve block, you cannot feel pain, pressure or extremes in temperature in the effected arm.  Because your arm is numb it is at an increased risk for injury.  To decrease the possibility of injury, please practice the following:  While you are awake change the position of your arm frequently to prevent too much pressure on any one area for prolonged periods of time.  If you have a cast or tight dressing, check the color or your fingers every couple of hours.  Call your surgeon with the appearance of any discoloration (white or blue). If you are given a sling to wear before you go home, please wear it  at all times until the block has completely worn off.  Do not get up at night without your sling. Please contact [facility] or your surgeon if you do not begin to regain sensation after 7 days from the surgery.  Anesthesia may be contacted by calling the Same Day Surgery Department, Mon. through Fri., 6 am to 4 pm at [phone].   If you experience any other problems or concerns, please contact your surgeon's office. If you experience severe or prolonged shortness of breath go to the nearest emergency department.

## 2023-02-20 NOTE — Evaluation (Signed)
 Occupational Therapy Evaluation Patient Details Name: [name] MRN: [id number] DOB: July 05, 1955 Today's Date: 02/20/2023   History of Present Illness [name] is a 68 y.o. male who initially underwent arthroscopic subscapularis repair and side-to-side supraspinatus repair with Regeneten patch application on 12/27/2021. Now s/p L RSA on 02/20/23.   Clinical Impression   [name] was seen for OT evaluation this date. Prior to hospital admission, pt was IND. Pt lives with spouse. Pt currently requires MIN cues for caregiver to don/doff shirt, sling, polar care. MOD A don pants. Pt instructed in polar care mgt, compression stockings mgt, sling/immobilizer mgt, ROM exercises for LUE (with instructions for no shoulder exercises until full sensation has returned), LUE precautions, adaptive strategies for bathing/dressing, and HEP. All education complete, will sign off. Upon hospital discharge, recommend no OT follow up.      If plan is discharge home, recommend the following: A lot of help with bathing/dressing/bathroom    Functional Status Assessment  Patient has had a recent decline in their functional status and demonstrates the ability to make significant improvements in function in a reasonable and predictable amount of time.  Equipment Recommendations  None recommended by OT    Recommendations for Other Services       Precautions / Restrictions Precautions Precautions: Shoulder Shoulder Interventions: Shoulder abduction pillow;Shoulder sling/immobilizer;Off for dressing/bathing/exercises Required Braces or Orthoses: Sling Restrictions Weight Bearing Restrictions Per Provider Order: Yes RUE Weight Bearing Per Provider Order: Non weight bearing      Mobility Bed Mobility               General bed mobility comments: not tested    Transfers Overall transfer level: Needs assistance   Transfers: Sit to/from Stand Sit to Stand: Supervision                   Balance Overall balance assessment: No apparent balance deficits (not formally assessed)                                         ADL either performed or assessed with clinical judgement   ADL Overall ADL's : Needs assistance/impaired                                       General ADL Comments: MIN cues for caregiver to don/doff shirt, sling, polar care. MOD A don pants.     Pertinent Vitals/Pain Pain Assessment Pain Assessment: No/denies pain     Extremity/Trunk Assessment Upper Extremity Assessment Upper Extremity Assessment: LUE deficits/detail LUE: Unable to fully assess due to immobilization   Lower Extremity Assessment Lower Extremity Assessment: Overall WFL for tasks assessed       Communication Communication Communication: No apparent difficulties   Cognition Arousal: Alert Behavior During Therapy: WFL for tasks assessed/performed Overall Cognitive Status: Within Functional Limits for tasks assessed                                                  Home Living Family/patient expects to be discharged to:: Private residence Living Arrangements: Spouse/significant other Available Help at Discharge: Family;Available 24 hours/day Type of Home: House Home Access: Stairs to enter  Entrance Stairs-Number of Steps: 3 Entrance Stairs-Rails: Right;Left Home Layout: One level                          Prior Functioning/Environment Prior Level of Function : Independent/Modified Independent;Driving                        OT Problem List: Impaired UE functional use         OT Goals(Current goals can be found in the care plan section) Acute Rehab OT Goals Patient Stated Goal: to go home OT Goal Formulation: With patient Time For Goal Achievement: 02/20/23 Potential to Achieve Goals: Good   AM-PAC OT 6 Clicks Daily Activity     Outcome Measure Help from another person eating meals?:  None Help from another person taking care of personal grooming?: A Little Help from another person toileting, which includes using toliet, bedpan, or urinal?: A Little Help from another person bathing (including washing, rinsing, drying)?: A Lot Help from another person to put on and taking off regular upper body clothing?: A Lot Help from another person to put on and taking off regular lower body clothing?: A Lot 6 Click Score: 16   End of Session    Activity Tolerance: Patient tolerated treatment well Patient left: in chair;with call bell/phone within reach;with family/visitor present;with nursing/sitter in room  OT Visit Diagnosis: Other abnormalities of gait and mobility (R26.89);Muscle weakness (generalized) (M62.81)                Time: 8751-8681 OT Time Calculation (min): 30 min Charges:  OT General Charges $OT Visit: 1 Visit OT Evaluation $OT Eval Low Complexity: 1 Low OT Treatments $Self Care/Home Management : 23-37 mins  [name], M.S. OTR/L  02/20/23, 1:55 PM  ascom [phone]

## 2023-02-20 NOTE — Anesthesia Procedure Notes (Signed)
Anesthesia Regional Block: Interscalene brachial plexus block   Pre-Anesthetic Checklist: , timeout performed,  Correct Patient, Correct Site, Correct Laterality,  Correct Procedure, Correct Position, site marked,  Risks and benefits discussed,  Surgical consent,  Pre-op evaluation,  At surgeon's request and post-op pain management  Laterality: Upper and Left  Prep: chloraprep       Needles:  Injection technique: Single-shot  Needle Type: Echogenic Stimulator Needle     Needle Length: 9cm  Needle Gauge: 20     Additional Needles:   Procedures:,,,, ultrasound used (permanent image in chart),,    Narrative:  Start time: 02/20/2023 7:35 AM End time: 02/20/2023 7:38 AM  Performed by: Personally  Anesthesiologist: Yevette Edwards, MD  Additional Notes: Pt. Identified and accepting of procedure after risks and benefits fully reviewed and questions answered. Time out performed and laterality confirmed prior to procedure.  ISNB  performed without difficulty and well tolerated.  Neg IV and SATD.  No pain on injection of Local anesthetic and VSST.

## 2023-02-20 NOTE — Progress Notes (Signed)
 The patient had his Hemovac removed with no complication.  The Hemovac tubing was intact on removal.  This was done before discharge home.  The patient is doing well since surgery.  He has neurovascular that is intact with good grip strength.  His vitals are stable.  He is ready to be discharged home.

## 2023-02-20 NOTE — Transfer of Care (Signed)
 Immediate Anesthesia Transfer of Care Note  Patient: [name]  Procedure(s) Performed: Left reverse shoulder arthroplasty, biceps tenodesis (Left: Shoulder)  Patient Location: PACU  Anesthesia Type:General  Level of Consciousness: awake and alert   Airway & Oxygen Therapy: Patient Spontanous Breathing and Patient connected to face mask oxygen  Post-op Assessment: Report given to RN and Post -op Vital signs reviewed and stable  Post vital signs: Reviewed and stable  Last Vitals:  Vitals Value Taken Time  BP 119/75 02/20/23 1045  Temp 36.3 C 02/20/23 1042  Pulse 71 02/20/23 1050  Resp 22 02/20/23 1050  SpO2 92 % 02/20/23 1050  Vitals shown include unfiled device data.  Last Pain:  Vitals:   02/20/23 1042  TempSrc:   PainSc: Asleep         Complications: No notable events documented.

## 2023-02-20 NOTE — Op Note (Signed)
 SURGERY DATE: 02/20/2023   PRE-OP DIAGNOSIS:  1. Left shoulder irreparable rotator cuff tear   POST-OP DIAGNOSIS:  1. Left shoulder irreparable rotator cuff tear   PROCEDURES:  1. Left reverse total shoulder arthroplasty 2. Left biceps tenodesis   SURGEON: Earnestine HILARIO Blanch, MD  ASSISTANTS: Krystal Doyne, PA; Leita Kidd, PA-S    ANESTHESIA: Gen + interscalene block   ESTIMATED BLOOD LOSS: 200cc   TOTAL IV FLUIDS: per anesthesia record  IMPLANTS: [employer] Surgical: RSP Glenoid Head w/Retaining screw 36N; Monoblock Reverse Shoulder Baseplate with 6.73mm central screw; 4 locking screws into baseplate; Standard Shell Short Humeral Stem 18 x 48mm; +8 Metal Tray; Neutral Standard Socket Insert;    INDICATION(S):  [name] is a 68 y.o. male who initially underwent arthroscopic subscapularis repair and side-to-side supraspinatus repair with Regeneten patch application by me on 12/27/2021.  Patient had persistent pain and repeat MRI showed large, retracted subscapularis re-tear and supraspinatus re-tear with signs of rotator cuff arthropathy.  Conservative measures including medications and cortisone injections have not provided adequate relief. After discussion of risks, benefits, and alternatives to surgery, the patient elected to proceed with reverse shoulder arthroplasty and biceps tenodesis.   OPERATIVE FINDINGS: Retracted full-thickness subscapularis tear, partial-thickness supraspinatus tear, biceps tendinopathy   OPERATIVE REPORT:   I identified [name] in the pre-operative holding area. Informed consent was obtained and the surgical site was marked. I reviewed the risks and benefits of the proposed surgical intervention and the patient wished to proceed. An interscalene block with Exparel  was administered by the Anesthesia team. The patient was transferred to the operative suite and general anesthesia was administered. The patient was placed in the beach chair position with the head of  the bed elevated approximately 45 degrees. All down side pressure points were appropriately padded. Pre-op exam under anesthesia confirmed some stiffness and crepitus. Appropriate IV antibiotics were administered. The extremity was then prepped and draped in standard fashion. A time out was performed confirming the correct extremity, correct patient, and correct procedure.   We used the standard deltopectoral incision from the coracoid to ~12cm distal. We found the cephalic vein and took it laterally. We opened the deltopectoral interval widely and placed retractors under the CA ligament in the subacromial space and under the deltoid tendon at its insertion. We then abducted and internally rotated the arm and released the underlying bursa between these retractors, taking care not to damage the circumflex branch of the axillary nerve.   Next, we brought the arm back in adduction at slight forward flexion with external rotation. We opened the clavipectoral fascia lateral to the conjoint tendon. We gently palpated the axillary nerve and verified its position and continuity on both sides of the humerus with a Tug test. This test was repeated multiple times during the procedure for nerve localization and confirmed to be intact at the end of the case. We then cauterized the anterior humeral circumflex ("Three sisters") vessels. The arm was then internally rotated, we cut the falciform ligament at approximately 1 cm of the upper portion of the pectoralis major insertion. Next we unroofed the bicipital groove. We proceeded with a soft tissue biceps tenodesis given the pathology (significant thickening and tendinopathy proximally) of the tendon.  After opening the biceps tendon sheath, we performed a biceps tenodesis with two #2 TiCron sutures to the upper border of the pectoralis major. The proximal portion of the tendon was excised.   At this point, we could see that the supraspinatus  had a partial tear of the bursal  side with significant synovitis. The subscapularis was completely torn and retracted.  There was also adhesed to the overlying conjoined tendon. We performed a subscapularis peel using electrocautery to remove the anterior capsule and subscapularis off of the humeral head. We released the inferior capsule from the humerus all the way to the posterior band of the inferior glenohumeral ligament. When this was complete we gently dislocated the shoulder up into the wound. We removed any osteophytes and made our cut with the appropriate inclination in 30 degrees of retroversion.  We then turned our attention back to the glenoid. The proximal humerus was retracted posteriorly. The anterior capsule was dissected free from the subscapularis. The anterior capsule was then excised, exposing the anterior glenoid. We then grasped the labrum and removed it circumferentially. During the glenoid exposure, the axillary nerve was protected the entire time.    A patient-specific guide was used to drill the central guidepin.  An appropriately sized reamer was used to ream the glenoid. A cannulated tap was placed over the guidepin. The monoblock baseplate was inserted and excellent fixation was achieved such that the entire scapula rotated with further attempted seating of the baseplate. The peripheral screws were drilled, measured, and placed. The glenosphere was then placed and tightened.   We then turned our attention back to the humerus. We sized for a standard shell prosthesis. We sequentially used larger diameter canal finders until we met appropriate resistance and sequential broaching was performed to this size listed above. Trial poly inserts were placed. The humerus was trialed and noted to have satisfactory stability, motion, and deltoid tension with above listed poly. The trial implants were removed. 3 drill holes were placed about the lesser tuberosity footprint and FiberWire sutures were passed through these holes  for subscapularis repair. Next, the implant was placed with the appropriate retroversion. Stability was confirmed. We placed the actual poly insert. The humerus was reduced and motion, tension, and stability were satisfactory. A Hemovac drain was placed. Subscapularis was repaired with the previously passed #2 FiberWire sutures after passing them through the subscapularis.   We again verified the tension on the axillary nerve, appropriate range of motion, stability of the implant, and security of the subscapularis repair. We closed the deltopectoral interval deep to the cephalic vein with a running, 0-Vicryl suture. The skin was closed with 2-0 Vicryl and 4-0 Monocryl. Xeroform and Honeycomb dressing was applied. A PolarCare unit and sling were placed. Patient was extubated, transferred to a stretcher bed and to the post antesthesia care unit in stable condition.   Of note, assistance from a PA was essential to performing the surgery.  PA was present for the entire surgery.  PA assisted with patient positioning, retraction, instrumentation, and wound closure. The surgery would have been more difficult and had longer operative time without PA assistance.    POSTOPERATIVE PLAN: The patient will be discharged home from the PACU. Operative arm to remain in sling at all times except RoM exercises and hygiene. Can perform pendulums, elbow/wrist/hand RoM exercises. Passive RoM allowed to 90 FF and 30 ER. ASA 325mg  x 6 weeks for DVT ppx. Plan for PT starting on POD #3-4. Patient to return to clinic in ~2 weeks for post-operative appointment.

## 2023-02-20 NOTE — H&P (Signed)
 Paper H&P to be scanned into permanent record. H&P reviewed. No significant changes noted.

## 2023-02-20 NOTE — Anesthesia Preprocedure Evaluation (Addendum)
Anesthesia Evaluation  Patient identified by MRN, date of birth, ID band Patient awake    Reviewed: Allergy & Precautions, H&P , NPO status , Patient's Chart, lab work & pertinent test results, reviewed documented beta blocker date and time   Airway Mallampati: II  TM Distance: >3 FB Neck ROM: full    Dental  (+) Teeth Intact   Pulmonary shortness of breath, former smoker   Pulmonary exam normal        Cardiovascular Exercise Tolerance: Poor hypertension, On Medications + CAD  Normal cardiovascular exam Rhythm:regular Rate:Normal     Neuro/Psych   Anxiety      Neuromuscular disease  negative psych ROS   GI/Hepatic Neg liver ROS, hiatal hernia,,,  Endo/Other  negative endocrine ROS    Renal/GU negative Renal ROS  negative genitourinary   Musculoskeletal   Abdominal   Peds  Hematology  (+) Blood dyscrasia, anemia   Anesthesia Other Findings Past Medical History: No date: (HFpEF) heart failure with preserved ejection fraction (HCC)     Comment:  a.) TTE 12/26/2013: EF 45-50%, mild ant and antsept HK.               mild MR. Mod dil LA. nl RV fxn. Rhythm was Afib; b.) TTE               07/04/2019: EF 55%, mid-apical anteroseptal HK, mild MAC,              mild Ao sclerosis, G2DD, RVSP 45.3; c.) TTE 06/02/2022:               EF 55-60%, no RWMAs, sev LAE, RVSP 37.9, mild-mod MR,               mild AR, AoV sclerosis without stenosis, asc Ao 38 mm No date: Anxiety No date: Aortic atherosclerosis (HCC) No date: Cardiomyopathy (in setting of Afib)     Comment:  a.) TTE 12/26/2013: EF 45-50%; b.) TTE 07/04/2019: EF               55%; c.) TTE 06/02/2022: EF 55-60% No date: Chronic pain syndrome     Comment:  a.) followed by pain management No date: Chronic, continuous use of opioids     Comment:  a.) on COT; followed by pain management 11/06/2014: Coronary artery disease     Comment:  a.) cCTA 11/06/2014: Ca2+ = 224  (74th %ile; LAD               distribution) No date: Diverticulosis No date: DJD (degenerative joint disease) of knee No date: Former smoker No date: History of hiatal hernia 2012: History of kidney stones No date: Hyperlipidemia No date: Hyperplastic colon polyp No date: Hypertension No date: Inguinal hernia, left No date: Internal hemorrhoids No date: Intervertebral disc disorder with radiculopathy of  lumbosacral region No date: Lesion of bone of lumbosacral spine     Comment:  L5 No date: Long term current use of amiodarone No date: Lower extremity edema No date: Mixed hyperlipidemia No date: Morbid obesity (HCC) No date: Multiple falls No date: Myalgia due to statin No date: On rivaroxaban therapy No date: Osteoarthritis No date: PAF (paroxysmal atrial fibrillation) (HCC)     Comment:  a.) CHA2DS2VASc = 4 (age, HTN, CHF, vascular disease) as              of 02/19/23;  b.) s/p DCCV 03/13/14 (200 J), 05/15/19               (  150 J x 1, 200 J x2), 05/19/2022 (150 J x1, 200 J x2;               pads changed to ant/lat postion with additional 200 J x 2              -> did not convert), 07/25/22 (200 J), 09/21/2022 (200               J); c.) s/p PVI ablation 11/17/2022; d.) rate/rhythm               maintained on amiodarone; chronically anticoagulated on               rivaroxaban No date: Pernicious anemia No date: Prostate cancer (HCC) No date: Pulmonary nodule, right     Comment:  a. 10/2014 Cardiac CTA: 7mm RLL nodule; b. 04/2015 CT               Chest: stable 7mm RLL nodule. No new nodules; 02/2017 CTA               Chest: stable, benign, 7mm RLL pulm nodule. No date: Rotator cuff tear, left No date: Sleep difficulties     Comment:  a.) takes melatonin + trazodone PRN No date: SVT (supraventricular tachycardia) (HCC) No date: Tubular adenoma of colon Past Surgical History: 11/17/2022: ATRIAL FIBRILLATION ABLATION; N/A     Comment:  Procedure: ATRIAL FIBRILLATION ABLATION;  Location: [facility];               Surgeon: Sheran Luz, MD 09/27/2021: BICEPT TENODESIS; Right     Comment:  Procedure: Right reverse shoulder arthroplasty, biceps               tenodesis;  Surgeon: Signa Kell, MD;  Location: [facility]               ORS;  Service: Orthopedics;  Laterality: Right; 05/15/2019: CARDIOVERSION; N/A     Comment:  Procedure: CARDIOVERSION;  Surgeon: Antonieta Iba,               MD;  Location: [facility]S;  Service: Cardiovascular;                Laterality: N/A; 03/13/2014: CARDIOVERSION; N/A     Comment:  Procedure: CARDIOVERSION; Location: [facility]; Surgeon:               [name], MD 03/24/2022: CARDIOVERSION; N/A     Comment:  Procedure: CARDIOVERSION;  Surgeon: Antonieta Iba,               MD;  Location: [facility];  Service: Cardiovascular;                Laterality: N/A; 05/19/2022: CARDIOVERSION; N/A     Comment:  Procedure: CARDIOVERSION;  Surgeon: Antonieta Iba,               MD;  Location: [facility];  Service: Cardiovascular;                Laterality: N/A; 07/25/2022: CARDIOVERSION; N/A     Comment:  Procedure: CARDIOVERSION;  Surgeon: Duke Salvia, MD;              Location: [facility];  Service: Cardiovascular;                Laterality: N/A; 09/21/2022: CARDIOVERSION; N/A     Comment:  Procedure: CARDIOVERSION;  Surgeon: Duke Salvia, MD;  Location: [facility];  Service: Cardiovascular;                Laterality: N/A; 12/23/2014: CARPAL TUNNEL RELEASE; Right     Comment:  Procedure: CARPAL TUNNEL RELEASE;  Surgeon: Deeann Saint, MD;  Location: [facility];  Service: Orthopedics;                Laterality: Right; 01/06/2015: CARPAL TUNNEL RELEASE; Left     Comment:  Procedure: CARPAL TUNNEL RELEASE;  Surgeon: Deeann Saint, MD;  Location: [facility];  Service: Orthopedics;                Laterality: Left; 10/08/2017: COLONOSCOPY WITH PROPOFOL; N/A     Comment:  Procedure: COLONOSCOPY WITH PROPOFOL;   Surgeon: Toney Reil, MD;  Location: [facility];  Service:               Gastroenterology;  Laterality: N/A; 06/07/2022: COLONOSCOPY WITH PROPOFOL; N/A     Comment:  Procedure: COLONOSCOPY WITH PROPOFOL;  Surgeon: Toney Reil, MD;  Location: [facility];  Service:               Gastroenterology;  Laterality: N/A; 06/07/2022: ENTEROSCOPY     Comment:  Procedure: ENTEROSCOPY;  Surgeon: Toney Reil,               MD;  Location: [facility];  Service:               Gastroenterology;; 06/07/2022: ESOPHAGOGASTRODUODENOSCOPY (EGD) WITH PROPOFOL; N/A     Comment:  Procedure: ESOPHAGOGASTRODUODENOSCOPY (EGD) WITH               PROPOFOL;  Surgeon: Toney Reil, MD;  Location:               [facility];  Service: Gastroenterology;  Laterality:               N/A; 01/17/2000: GASTRIC BYPASS No date: KNEE SURGERY; Right     Comment:  knee trauma x3 No date: prostate seeding 09/27/2021: REVERSE SHOULDER ARTHROPLASTY; Right     Comment:  Procedure: Right reverse shoulder arthroplasty, biceps               tenodesis;  Surgeon: Signa Kell, MD;  Location: [facility]               ORS;  Service: Orthopedics;  Laterality: Right; 12/27/2021: SHOULDER ARTHROSCOPY WITH ROTATOR CUFF REPAIR AND OPEN  BICEPS TENODESIS; Left     Comment:  Procedure: Left shoulder arthroscopic cuff repair               (supraspinatus and subscapularis) with Regeneten Patch               application;  Surgeon: Signa Kell, MD;  Location: [facility]               ORS;  Service: Orthopedics;  Laterality: Left;   Reproductive/Obstetrics negative OB ROS                             Anesthesia Physical Anesthesia Plan  ASA: 3  Anesthesia Plan: General  ETT   Post-op Pain Management: Regional block*   Induction:   PONV Risk Score and Plan: 3  Airway Management Planned:   Additional Equipment:   Intra-op Plan:   Post-operative Plan:    Informed Consent: I have reviewed the patients History and Physical, chart, labs and discussed the procedure including the risks, benefits and alternatives for the proposed anesthesia with the patient or authorized representative who has indicated his/her understanding and acceptance.     Dental Advisory Given  Plan Discussed with: CRNA  Anesthesia Plan Comments: (Pt denies Bupivicaine allergy after discussion and reports blaming bupivicaine following a previous shoulder surgery where he experienced a fast fluttering heart rate in recovery after shoulder surgery.  He was diagnosed with A fib and later rate controlled at which time the symptoms got better. We have discussed the risks and benefits of shoulder block today and he desires to proceed with the injection. ja)       Anesthesia Quick Evaluation

## 2023-02-20 NOTE — Anesthesia Procedure Notes (Signed)
 Procedure Name: Intubation Date/Time: 02/20/2023 7:49 AM  Performed by: Niki Manus SAUNDERS, CRNAPre-anesthesia Checklist: Patient identified, Patient being monitored, Timeout performed, Emergency Drugs available and Suction available Patient Re-evaluated:Patient Re-evaluated prior to induction Oxygen Delivery Method: Circle system utilized Preoxygenation: Pre-oxygenation with 100% oxygen Induction Type: IV induction Ventilation: Mask ventilation without difficulty Laryngoscope Size: Mac and 4 Grade View: Grade I Tube type: Oral Tube size: 7.5 mm Number of attempts: 1 Airway Equipment and Method: Stylet Placement Confirmation: ETT inserted through vocal cords under direct vision, positive ETCO2 and breath sounds checked- equal and bilateral Secured at: 23 cm Tube secured with: Tape Dental Injury: Teeth and Oropharynx as per pre-operative assessment

## 2023-02-20 NOTE — Progress Notes (Signed)
Pt is a pleasant 68 year old male who was admitted for L TSA. Pt demonstrates all bed mobility/transfers/ambulation at baseline level. Has completed all goals for hospital PT and will be d/c'd this date. Educated on polar care system and HEP. Pt looking forward to next part of recovery.  [name], PT, DPT, [name] [phone]    02/20/23 1627  PT Visit Information  Assistance Needed +1  History of Present Illness [name] is a 68 y.o. male who initially underwent arthroscopic subscapularis repair and side-to-side supraspinatus repair with Regeneten patch application on 12/27/2021. Now s/p L RSA on 02/20/23.  Precautions  Precautions Shoulder  Shoulder Interventions Shoulder abduction pillow;Shoulder sling/immobilizer;Off for dressing/bathing/exercises  Precaution Booklet Issued Yes (comment)  Required Braces or Orthoses Sling  Restrictions  Weight Bearing Restrictions Per Provider Order Yes  LUE Weight Bearing Per Provider Order NWB  Home Living  Family/patient expects to be discharged to: Private residence  Living Arrangements Spouse/significant other  Available Help at Discharge Family;Available 24 hours/day  Type of Home House  Home Access Stairs to enter  Entrance Stairs-Number of Steps 3  Entrance Stairs-Rails Right;Left  Home Layout One level  Home Equipment None  Prior Function  Prior Level of Function  Independent/Modified Independent;Driving  Mobility Comments indep prior  ADLs Comments indep  Cognition  Arousal Alert  Behavior During Therapy WFL for tasks assessed/performed  Overall Cognitive Status Within Functional Limits for tasks assessed  General Comments pleasant and agreeable to session  Communication  Communication No apparent difficulties  Upper Extremity Assessment  LUE Sensation decreased light touch  Bed Mobility  General bed mobility comments not tested, received in chair  Transfers  Overall transfer level Needs assistance  Equipment used None   Transfers Sit to/from Stand  Sit to Stand Supervision  General transfer comment safe technique. Pushes from arm rest on chair. Once standing, upright posture noted  Ambulation/Gait  Ambulation/Gait assistance Contact guard assist  Gait Distance (Feet) 100 Feet  Assistive device None  Gait Pattern/deviations Step-through pattern  General Gait Details Able to ambulate in hallway with safe technique and reciprocal gait pattern. No dizziness noted  Stairs Yes  Stairs assistance Contact guard assist  Stair Management One rail Left;Step to pattern  Number of Stairs 4  General stair comments up/down with safe technique and step to gait pattern  Balance  Overall balance assessment No apparent balance deficits (not formally assessed)  Exercises  Exercises Other exercises  Other Exercises  Other Exercises reviewed hand/wrist/elbow ther-ex on written packet  PT - End of Session  Activity Tolerance Patient tolerated treatment well  Patient left in chair  Nurse Communication Mobility status  PT Assessment  PT Recommendation/Assessment All further PT needs can be met in the next venue of care  PT Visit Diagnosis Muscle weakness (generalized) (M62.81)  PT Problem List Decreased mobility  AM-PAC PT "6 Clicks" Mobility Outcome Measure (Version 2)  Help needed turning from your back to your side while in a flat bed without using bedrails? 4  Help needed moving from lying on your back to sitting on the side of a flat bed without using bedrails? 4  Help needed moving to and from a bed to a chair (including a wheelchair)? 4  Help needed standing up from a chair using your arms (e.g., wheelchair or bedside chair)? 4  Help needed to walk in hospital room? 4  Help needed climbing 3-5 steps with a railing?  4  6 Click Score 24  Consider  Recommendation of Discharge To: Home with no services  Progressive Mobility  What is the highest level of mobility based on the progressive mobility assessment? Level  5 (Walks with assist in room/hall) - Balance while stepping forward/back and can walk in room with assist - Complete  Mobility Referral No  Activity Ambulated independently in hallway  PT Recommendation  Follow Up Recommendations Outpatient PT  Patient can return home with the following Assist for transportation  Functional Status Assessment Patient has had a recent decline in their functional status and demonstrates the ability to make significant improvements in function in a reasonable and predictable amount of time.  PT equipment None recommended by PT  Individuals Consulted  Consulted and Agree with Results and Recommendations Patient  Acute Rehab PT Goals  Patient Stated Goal to go home  PT Goal Formulation With patient  Time For Goal Achievement 02/20/23  Potential to Achieve Goals Good  PT Time Calculation  PT Start Time (ACUTE ONLY) 1319  PT Stop Time (ACUTE ONLY) 1331  PT Time Calculation (min) (ACUTE ONLY) 12 min  PT General Charges  $$ ACUTE PT VISIT 1 Visit  PT Evaluation  $PT Eval Low Complexity 1 Low

## 2023-02-21 LAB — SURGICAL PATHOLOGY

## 2023-02-22 NOTE — Anesthesia Postprocedure Evaluation (Signed)
 Anesthesia Post Note  Patient: [name]  Procedure(s) Performed: Left reverse shoulder arthroplasty, biceps tenodesis (Left: Shoulder)  Patient location during evaluation: PACU Anesthesia Type: General Level of consciousness: awake and alert Pain management: pain level controlled Vital Signs Assessment: post-procedure vital signs reviewed and stable Respiratory status: spontaneous breathing, nonlabored ventilation, respiratory function stable and patient connected to nasal cannula oxygen Cardiovascular status: blood pressure returned to baseline and stable Postop Assessment: no apparent nausea or vomiting Anesthetic complications: no   No notable events documented.   Last Vitals:  Vitals:   02/20/23 1139 02/20/23 1503  BP: 123/74 (!) 104/56  Pulse:  83  Resp:  18  Temp:  37 C  SpO2: 93% 95%    Last Pain:  Vitals:   02/21/23 0939  TempSrc:   PainSc: 0-No pain                 Lynwood KANDICE Clause

## 2023-02-26 ENCOUNTER — Ambulatory Visit: Payer: Medicare Other | Attending: Orthopedic Surgery

## 2023-02-26 DIAGNOSIS — G8929 Other chronic pain: Secondary | ICD-10-CM | POA: Diagnosis not present

## 2023-02-26 DIAGNOSIS — M25512 Pain in left shoulder: Secondary | ICD-10-CM | POA: Insufficient documentation

## 2023-02-26 DIAGNOSIS — M6281 Muscle weakness (generalized): Secondary | ICD-10-CM

## 2023-02-26 NOTE — Therapy (Signed)
 OUTPATIENT PHYSICAL THERAPY SHOULDER EVALUATION  Patient Name: [name] MRN: [id number] DOB:07/29/1955, 68 y.o., male Today's Date: 02/28/2023  END OF SESSION:  PT End of Session - 02/28/23 0914     Visit Number 1    Number of Visits 25    Date for PT Re-Evaluation 05/21/23    Authorization Type eval: 02/26/23, [employer] Medicare, [id number] on AUTH    PT Start Time 1604    PT Stop Time 1643    PT Time Calculation (min) 39 min    Activity Tolerance Patient tolerated treatment well    Behavior During Therapy WFL for tasks assessed/performed            Past Medical History:  Diagnosis Date   (HFpEF) heart failure with preserved ejection fraction (HCC)    a.) TTE 12/26/2013: EF 45-50%, mild ant and antsept HK. mild MR. Mod dil LA. nl RV fxn. Rhythm was Afib; b.) TTE 07/04/2019: EF 55%, mid-apical anteroseptal HK, mild MAC, mild Ao sclerosis, G2DD, RVSP 45.3; c.) TTE 06/02/2022: EF 55-60%, no RWMAs, sev LAE, RVSP 37.9, mild-mod MR, mild AR, AoV sclerosis without stenosis, asc Ao 38 mm   Anxiety    Aortic atherosclerosis (HCC)    Cardiomyopathy (in setting of Afib)    a.) TTE 12/26/2013: EF 45-50%; b.) TTE 07/04/2019: EF 55%; c.) TTE 06/02/2022: EF 55-60%   Chronic pain syndrome    a.) followed by pain management   Chronic, continuous use of opioids    a.) on COT; followed by pain management   Coronary artery disease 11/06/2014   a.) cCTA 11/06/2014: Ca2+ = 224 (74th %ile; LAD distribution)   Diverticulosis    DJD (degenerative joint disease) of knee    Former smoker    History of hiatal hernia    History of kidney stones 2012   Hyperlipidemia    Hyperplastic colon polyp    Hypertension    Inguinal hernia, left    Internal hemorrhoids    Intervertebral disc disorder with radiculopathy of lumbosacral region    Lesion of bone of lumbosacral spine    L5   Long term current use of amiodarone    Lower extremity edema    Mixed hyperlipidemia    Morbid obesity (HCC)    Multiple  falls    Myalgia due to statin    On rivaroxaban therapy    Osteoarthritis    PAF (paroxysmal atrial fibrillation) (HCC)    a.) CHA2DS2VASc = 4 (age, HTN, CHF, vascular disease) as of 02/19/23;  b.) s/p DCCV 03/13/14 (200 J), 05/15/19 (150 J x 1, 200 J x2), 05/19/2022 (150 J x1, 200 J x2; pads changed to ant/lat postion with additional 200 J x 2 -> did not convert), 07/25/22 (200 J), 09/21/2022 (200 J); c.) s/p PVI ablation 11/17/2022; d.) rate/rhythm maintained on amiodarone; chronically anticoagulated on rivaroxaban   Pernicious anemia    Prostate cancer (HCC)    Pulmonary nodule, right    a. 10/2014 Cardiac CTA: 7mm RLL nodule; b. 04/2015 CT Chest: stable 7mm RLL nodule. No new nodules; 02/2017 CTA Chest: stable, benign, 7mm RLL pulm nodule.   Rotator cuff tear, left    Sleep difficulties    a.) takes melatonin + trazodone PRN   SVT (supraventricular tachycardia) (HCC)    Tubular adenoma of colon    Past Surgical History:  Procedure Laterality Date   ATRIAL FIBRILLATION ABLATION N/A 11/17/2022   Procedure: ATRIAL FIBRILLATION ABLATION; Location: [facility]; Surgeon: Sheran Luz, MD   BICEPT  TENODESIS Right 09/27/2021   Procedure: Right reverse shoulder arthroplasty, biceps tenodesis;  Surgeon: Signa Kell, MD;  Location: [facility];  Service: Orthopedics;  Laterality: Right;   CARDIOVERSION N/A 05/15/2019   Procedure: CARDIOVERSION;  Surgeon: Antonieta Iba, MD;  Location: [facility];  Service: Cardiovascular;  Laterality: N/A;   CARDIOVERSION N/A 03/13/2014   Procedure: CARDIOVERSION; Location: [facility]; Surgeon: [name], MD   CARDIOVERSION N/A 03/24/2022   Procedure: CARDIOVERSION;  Surgeon: Antonieta Iba, MD;  Location: [facility];  Service: Cardiovascular;  Laterality: N/A;   CARDIOVERSION N/A 05/19/2022   Procedure: CARDIOVERSION;  Surgeon: Antonieta Iba, MD;  Location: [facility];  Service: Cardiovascular;  Laterality: N/A;   CARDIOVERSION N/A 07/25/2022   Procedure:  CARDIOVERSION;  Surgeon: Duke Salvia, MD;  Location: [facility];  Service: Cardiovascular;  Laterality: N/A;   CARDIOVERSION N/A 09/21/2022   Procedure: CARDIOVERSION;  Surgeon: Duke Salvia, MD;  Location: [facility];  Service: Cardiovascular;  Laterality: N/A;   CARPAL TUNNEL RELEASE Right 12/23/2014   Procedure: CARPAL TUNNEL RELEASE;  Surgeon: Deeann Saint, MD;  Location: [facility];  Service: Orthopedics;  Laterality: Right;   CARPAL TUNNEL RELEASE Left 01/06/2015   Procedure: CARPAL TUNNEL RELEASE;  Surgeon: Deeann Saint, MD;  Location: [facility];  Service: Orthopedics;  Laterality: Left;   COLONOSCOPY WITH PROPOFOL N/A 10/08/2017   Procedure: COLONOSCOPY WITH PROPOFOL;  Surgeon: Toney Reil, MD;  Location: [facility];  Service: Gastroenterology;  Laterality: N/A;   COLONOSCOPY WITH PROPOFOL N/A 06/07/2022   Procedure: COLONOSCOPY WITH PROPOFOL;  Surgeon: Toney Reil, MD;  Location: [facility]OSCOPY;  Service: Gastroenterology;  Laterality: N/A;   ENTEROSCOPY  06/07/2022   Procedure: ENTEROSCOPY;  Surgeon: Toney Reil, MD;  Location: [facility];  Service: Gastroenterology;;   ESOPHAGOGASTRODUODENOSCOPY (EGD) WITH PROPOFOL N/A 06/07/2022   Procedure: ESOPHAGOGASTRODUODENOSCOPY (EGD) WITH PROPOFOL;  Surgeon: Toney Reil, MD;  Location[facility]COPY;  Service: Gastroenterology;  Laterality: N/A;   GASTRIC BYPASS  01/17/2000   KNEE SURGERY Right    knee trauma x3   prostate seeding     REVERSE SHOULDER ARTHROPLASTY Right 09/27/2021   Procedure: Right reverse shoulder arthroplasty, biceps tenodesis;  Surgeon: Signa Kell, MD;  Location: [facility];  Service: Orthopedics;  Laterality: Right;   REVERSE SHOULDER ARTHROPLASTY Left 02/20/2023   Procedure: Left reverse shoulder arthroplasty, biceps tenodesis;  Surgeon: Signa Kell, MD;  Location: [facility];  Service: Orthopedics;  Laterality: Left;   SHOULDER ARTHROSCOPY WITH ROTATOR CUFF REPAIR AND OPEN  BICEPS TENODESIS Left 12/27/2021   Procedure: Left shoulder arthroscopic cuff repair (supraspinatus and subscapularis) with Regeneten Patch application;  Surgeon: Signa Kell, MD;  Location: [facility];  Service: Orthopedics;  Laterality: Left;   Patient Active Problem List   Diagnosis Date Noted   Symptomatic anemia 04/26/2022   Chronic pain of both knees 03/30/2022   Bilateral primary osteoarthritis of knee 03/30/2022   Shortness of breath 03/24/2022   Vitamin D deficiency 02/24/2022   S/p reverse total shoulder arthroplasty 09/27/2021   Lesion of bone of lumbosacral spine (L5) 05/12/2021   Localized osteoarthritis of shoulder regions, bilateral 03/29/2021   Chronic pain of both shoulders 03/29/2021   Drug-induced myopathy 02/21/2021   Trigger finger of right hand 11/15/2020   Prostate cancer (HCC) 07/26/2020   Insomnia 08/01/2019   Primary osteoarthritis of both wrists 02/17/2019   Trigger middle finger of left hand 02/17/2019   Chronic pain syndrome 02/17/2019   Chronic radicular lumbar pain 10/08/2018   Lumbar  radiculopathy 10/08/2018   Lumbar degenerative disc disease 10/08/2018   Lumbar facet arthropathy 10/08/2018   Lumbar facet joint syndrome 10/08/2018   Intervertebral disc disorder with radiculopathy of lumbosacral region    Osteoarthritis of knee 11/30/2017   Osteoarthritis of wrist 11/30/2017   Pernicious anemia 05/22/2016   Persistent atrial fibrillation (HCC) 12/20/2015   Carpal tunnel syndrome 11/16/2014   DJD (degenerative joint disease) of knee 10/27/2014   Bilateral carpal tunnel syndrome 10/27/2014   Morbid obesity with BMI of 45.0-49.9, adult (HCC) 10/27/2014   Mixed hyperlipidemia 10/27/2014   H/O gastric bypass 03/20/2014   Hyperkalemia 03/20/2014   History of prostate cancer 12/26/2013   Essential hypertension 12/26/2013   Encounter for anticoagulation discussion and counseling 12/26/2013    PCP: Dr. Althea Charon  REFERRING PROVIDER: Dr. Signa Kell  REFERRING DIAG: [phone] (ICD-10-CM) - Complete rotator cuff tear or rupture of left shoulder, not specified as traumatic   RATIONALE FOR EVALUATION AND TREATMENT: Rehabilitation  THERAPY DIAG: Chronic left shoulder pain  Muscle weakness (generalized)  ONSET DATE: 02/20/23  FOLLOW-UP APPT SCHEDULED WITH REFERRING PROVIDER: Yes    SUBJECTIVE:                                                                                                                                                                                         SUBJECTIVE STATEMENT:  L reverse TSR 02/20/23  PERTINENT HISTORY:  ROYCE STEGMAN is a 68 y.o. male who initially underwent arthroscopic subscapularis repair and side-to-side supraspinatus repair with Regeneten patch application by Dr. Allena Katz 12/27/2021. He underwent post-surgical physical therapy with initial minimal improvement followed by worsening shoulder strength/function. Repeat MRI showed large, retracted subscapularis re-tear and supraspinatus re-tear with signs of rotator cuff arthropathy. Conservative measures including medications, physical therapy, and cortisone injections did not provide adequate relief. The patient elected to proceed with reverse shoulder arthroplasty and biceps tenodesis on 02/20/23. Intraoperative findings include retracted full-thickness subscapularis tear, partial-thickness supraspinatus tear, and biceps tendinopathy. Surgery was a standard deltopectoral incision under general anesthesia along with an interscalene block. Per op note, operative arm to remain in sling at all times except ROM exercises and hygiene. Can perform pendulums, elbow/wrist/hand ROM exercises. Passive ROM allowed to 90 FF and 30 ER. ASA 325mg  x 6 weeks for DVT ppx (pt states that he was actually instructed to start back on his Xarelto and not to use ASA). Plan for PT starting on POD #3-4. Pt denies any post-operative complications from surgery. Pt has a history of chronic  back pain and has undergone multiple lumbar facet, medial branch radiofrequency ablations. He also has a past history of cardiomyopathy (in setting  of Afib), DJD (degenerative joint disease) of knee, kidney stones, lower extremity edema, PAF (paroxysmal atrial fibrillation), pernicious anemia, prostate cancer, and a pulmonary nodule. He also has a past surgical history that includes R reverse TSR, knee surgery, gastric bypass (2002), prostate seeding, carpal tunnel release (Right, 12/23/2014; Left, 01/06/2015), and multiple cardioversions.   PAIN:  Pain Intensity: No resting pain upon arrival Pain location: L shoulder Pain Quality: "Nerve pain" Radiating: No  Numbness/Tingling: No Focal Weakness: Yes 24-hour pain behavior: Varies History of prior shoulder or neck/shoulder injury, pain, surgery, or therapy: Yes PT for L shoulder s/p RTC repair, history of R reverse TSR; Dominant hand: right Red flags Positive: history of prostate CA, Negative: chills/fever, night sweats, nausea, vomiting, unrelenting pain, unexplained weight gain/loss  PRECAUTIONS: Shoulder, per operative note: operative arm to remain in sling at all times except ROM exercises and hygiene. Can perform pendulums, elbow/wrist/hand ROM exercises. Passive ROM allowed to 90 FF and 30 ER.   General reverse TSR precautions include no combined shoulder extension, IR, and adduction. No shoulder and elbow AROM (bicep tenodesis) for at least the first 4 weeks   WEIGHT BEARING RESTRICTIONS: Yes no WB through LUE  FALLS: Has patient fallen in last 6 months? 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  Recent memory is intact.  Remote memory is intact.  Attention span and concentration are intact.  Expressive speech is intact.  Patient's fund of knowledge is within normal limits for educational level.    Gross Musculoskeletal Assessment Tremor: None Bulk: Normal Tone: Normal Incision is clean and dry with minimal blood staining on honeycomb dressing as well as minimal swelling around the incision. Skin is cool to touch and no signs of infection present.   Gait Deferred full gait assessment  Posture Forward head  with upper thoracic kyphosis and rounded shoulders  Cervical Screen Deferred  ROM ROM (Normal range in degrees)    Right (AROM) Left (PROM)  Shoulder    Flexion 160 90 (limited per protocol)  Extension    Abduction 180 90 (scaption)  External Rotation 88 0  Internal Rotation 70 stomach  Hands Behind Head    Hands Behind Back        Elbow    Flexion Full Full  Extension 0 0  Pronation WNL Full palm up  Supination WNL Full palm down  (* = pain; Blank rows = not tested)  UE MMT: MMT (out of 5) Right Left   Shoulder   Flexion 5   Extension    Abduction 5   External rotation 5   Internal rotation 5   Horizontal abduction    Horizontal adduction    Lower Trapezius    Rhomboids        Elbow  Flexion 5   Extension 5   Pronation 5   Supination 5       Wrist  Flexion 5 5  Extension 5 5  Radial deviation  5  Ulnar deviation  5      MCP  Flexion 5 5  Extension 5 5  Abduction 5 5  Adduction 5 5  (* = pain; Blank rows = not tested)  Sensation Deferred  Reflexes Deferred  Palpation No tenderness to light palpation around entire L  shoulder girdle;  Passive Accessory Intervertebral Motion Deferred  Accessory Motions/Glides Deferred  Muscle Length Testing Deferred  SPECIAL TESTS Deferred  Beighton scale Deferred   TODAY'S TREATMENT    Manual Therapy  L shoulder PROM into flexion and scaption stopping at 90 degrees FF; L shoulder PROM ER stopping at 0 secondary to pain; L wrist AROM flexion and extension; HEP issued and reviewed with patient;   PATIENT EDUCATION:  Education details: Plan of care and HEP Person educated: Patient Education method: Explanation, Actor cues, Verbal cues, and Handouts Education comprehension: verbalized understanding and returned demonstration   HOME EXERCISE PROGRAM:  Access Code: [email or web address] URL: [email or web address] Date: 02/28/2023 Prepared by: Ria Comment  Exercises - Seated Cervical Sidebending Stretch (Mirrored)  - 2 x daily - 7 x weekly - 3 reps - 30s hold - Seated Scapular Retraction  - 2 x daily - 7 x weekly - 2 sets - 10 reps - 3s hold - Circular Shoulder Pendulum with Table Support  - 2 x daily - 7 x weekly - 3 reps - 60s hold - Horizontal Shoulder Pendulum with Table Support  - 2 x daily - 7 x weekly - 3 reps - 60s hold - Flexion-Extension Shoulder Pendulum with Table Support (Mirrored)  - 2 x daily - 7 x weekly - 3 reps - 60s hold - Wrist Flexion AROM  - 2 x daily - 7 x weekly - 2 sets - 10 reps - 3s hold - Wrist Extension AROM  - 1 x daily - 7 x weekly - 2 sets - 10 reps - 3s hold    ASSESSMENT:  CLINICAL IMPRESSION: Patient is a 68 y.o. male who was seen today for physical therapy evaluation and treatment s/p L shoulder reverse TSR.   OBJECTIVE IMPAIRMENTS: decreased ROM, decreased strength, and pain.   ACTIVITY LIMITATIONS: carrying, lifting, bathing, toileting, dressing, and reach over head  PARTICIPATION LIMITATIONS: meal prep, cleaning, laundry, driving, shopping, community activity, and yard work  PERSONAL FACTORS:  Past/current experiences, Time since onset of injury/illness/exacerbation, and 3+ comorbidities: OA, anemia, anxiety, cardiomyopathy, chronic pain  are also affecting patient's functional outcome.   REHAB POTENTIAL: Good  CLINICAL DECISION MAKING: Unstable/unpredictable  EVALUATION COMPLEXITY: High   GOALS: Goals reviewed with patient? Yes  SHORT TERM GOALS: Target date: 04/09/2023  Pt will be independent with HEP to improve strength and decrease shoulder pain to improve pain-free function at home and work. Baseline:  Goal status: INITIAL   LONG TERM GOALS: Target date: 05/21/2023  1.  Pt will report no further L shoulder pain with all AROM in order to complete all ADLs and improve pain-free function. Baseline:  Goal status: INITIAL  2.  Pt will decrease QuickDASH score by at least 8% in order to demonstrate clinically significant reduction in disability related to shoulder pain        Baseline: 02/26/23: 72.7% Goal status: INITIAL  3. Pt will increase strength of L shoulder flexion and scaption to at least 4/5 in order to demonstrate improvement in strength and function         Baseline: 02/26/23: Unable to test Goal status: INITIAL  4. Pt will improve L  shoulder AROM flexion and scaption to at least 110 degrees in order to demonstrate improvement in function so he can take care of his property and complete all household responsibilities;        Baseline: 02/26/23: Unable to test (90 degrees PROM for both) Goal status: INITIAL   PLAN: PT FREQUENCY: 1-2x/week  PT DURATION: 12 weeks  PLANNED INTERVENTIONS: Therapeutic exercises, Therapeutic activity, Neuromuscular re-education, Balance training, Gait training, Patient/Family education, Self Care, Joint mobilization, Joint manipulation, Vestibular training, Canalith repositioning, Orthotic/Fit training, DME instructions, Dry Needling, Electrical stimulation, Spinal manipulation, Spinal mobilization, Cryotherapy, Moist heat, Taping,  Traction, Ultrasound, Ionotophoresis 4mg /ml Dexamethasone, Manual therapy, and Re-evaluation.  PLAN FOR NEXT SESSION: Sensation testing, progress L shoulder per surgical protocol;   Sharalyn Ink Zyla Dascenzo PT, DPT, GCS  Bayyinah Dukeman, PT 02/28/2023, 9:29 AM

## 2023-02-27 ENCOUNTER — Other Ambulatory Visit: Payer: Self-pay | Admitting: *Deleted

## 2023-02-27 ENCOUNTER — Other Ambulatory Visit: Payer: Self-pay

## 2023-02-27 DIAGNOSIS — D508 Other iron deficiency anemias: Secondary | ICD-10-CM

## 2023-02-28 ENCOUNTER — Encounter: Payer: Self-pay | Admitting: Internal Medicine

## 2023-02-28 ENCOUNTER — Inpatient Hospital Stay: Payer: Medicare Other | Attending: Internal Medicine

## 2023-02-28 ENCOUNTER — Inpatient Hospital Stay: Payer: Medicare Other | Admitting: Internal Medicine

## 2023-02-28 ENCOUNTER — Inpatient Hospital Stay: Payer: Medicare Other

## 2023-02-28 VITALS — BP 135/96 | HR 88 | Temp 97.3°F | Ht 73.0 in | Wt 349.2 lb

## 2023-02-28 VITALS — BP 115/78 | HR 86

## 2023-02-28 DIAGNOSIS — C61 Malignant neoplasm of prostate: Secondary | ICD-10-CM | POA: Diagnosis not present

## 2023-02-28 DIAGNOSIS — D649 Anemia, unspecified: Secondary | ICD-10-CM | POA: Diagnosis not present

## 2023-02-28 DIAGNOSIS — I2510 Atherosclerotic heart disease of native coronary artery without angina pectoris: Secondary | ICD-10-CM | POA: Insufficient documentation

## 2023-02-28 DIAGNOSIS — E669 Obesity, unspecified: Secondary | ICD-10-CM | POA: Insufficient documentation

## 2023-02-28 DIAGNOSIS — Z87891 Personal history of nicotine dependence: Secondary | ICD-10-CM | POA: Insufficient documentation

## 2023-02-28 DIAGNOSIS — D508 Other iron deficiency anemias: Secondary | ICD-10-CM

## 2023-02-28 DIAGNOSIS — Z7901 Long term (current) use of anticoagulants: Secondary | ICD-10-CM | POA: Insufficient documentation

## 2023-02-28 DIAGNOSIS — Z79899 Other long term (current) drug therapy: Secondary | ICD-10-CM | POA: Insufficient documentation

## 2023-02-28 DIAGNOSIS — R5383 Other fatigue: Secondary | ICD-10-CM | POA: Diagnosis not present

## 2023-02-28 DIAGNOSIS — Z87442 Personal history of urinary calculi: Secondary | ICD-10-CM | POA: Diagnosis not present

## 2023-02-28 DIAGNOSIS — I4891 Unspecified atrial fibrillation: Secondary | ICD-10-CM | POA: Insufficient documentation

## 2023-02-28 DIAGNOSIS — E538 Deficiency of other specified B group vitamins: Secondary | ICD-10-CM | POA: Insufficient documentation

## 2023-02-28 DIAGNOSIS — Z9884 Bariatric surgery status: Secondary | ICD-10-CM | POA: Insufficient documentation

## 2023-02-28 LAB — CBC WITH DIFFERENTIAL/PLATELET
Abs Immature Granulocytes: 0.04 10*3/uL (ref 0.00–0.07)
Basophils Absolute: 0 10*3/uL (ref 0.0–0.1)
Basophils Relative: 0 %
Eosinophils Absolute: 0.1 10*3/uL (ref 0.0–0.5)
Eosinophils Relative: 1 %
HCT: 40.2 % (ref 39.0–52.0)
Hemoglobin: 13.2 g/dL (ref 13.0–17.0)
Immature Granulocytes: 1 %
Lymphocytes Relative: 24 %
Lymphs Abs: 1.9 10*3/uL (ref 0.7–4.0)
MCH: 28.5 pg (ref 26.0–34.0)
MCHC: 32.8 g/dL (ref 30.0–36.0)
MCV: 86.8 fL (ref 80.0–100.0)
Monocytes Absolute: 0.9 10*3/uL (ref 0.1–1.0)
Monocytes Relative: 12 %
Neutro Abs: 4.9 10*3/uL (ref 1.7–7.7)
Neutrophils Relative %: 62 %
Platelets: 283 10*3/uL (ref 150–400)
RBC: 4.63 MIL/uL (ref 4.22–5.81)
RDW: 13.1 % (ref 11.5–15.5)
WBC: 7.8 10*3/uL (ref 4.0–10.5)
nRBC: 0 % (ref 0.0–0.2)

## 2023-02-28 LAB — BASIC METABOLIC PANEL - CANCER CENTER ONLY
Anion gap: 9 (ref 5–15)
BUN: 13 mg/dL (ref 8–23)
CO2: 25 mmol/L (ref 22–32)
Calcium: 8.4 mg/dL — ABNORMAL LOW (ref 8.9–10.3)
Chloride: 105 mmol/L (ref 98–111)
Creatinine: 1 mg/dL (ref 0.61–1.24)
GFR, Estimated: >60 mL/min (ref >60)
Glucose, Bld: 128 mg/dL — ABNORMAL HIGH (ref 70–99)
Potassium: 4.2 mmol/L (ref 3.5–5.1)
Sodium: 139 mmol/L (ref 135–145)

## 2023-02-28 LAB — IRON AND TIBC
Iron: 43 ug/dL — ABNORMAL LOW (ref 45–182)
Saturation Ratios: 15 % — ABNORMAL LOW (ref 17.9–39.5)
TIBC: 293 ug/dL (ref 250–450)
UIBC: 250 ug/dL

## 2023-02-28 LAB — FERRITIN: Ferritin: 95 ng/mL (ref 24–336)

## 2023-02-28 MED ORDER — IRON SUCROSE 20 MG/ML IV SOLN
200.0000 mg | Freq: Once | INTRAVENOUS | Status: AC
  Administered 2023-02-28: 200 mg via INTRAVENOUS
  Filled 2023-02-28: qty 10

## 2023-02-28 MED ORDER — SODIUM CHLORIDE 0.9% FLUSH
10.0000 mL | Freq: Once | INTRAVENOUS | Status: AC | PRN
  Administered 2023-02-28: 10 mL
  Filled 2023-02-28: qty 10

## 2023-02-28 NOTE — Assessment & Plan Note (Addendum)
#   Anemia- Hb-symptomatic.  Likely due to iron deficiency -likely secondary to malabsorption/Hx of gastric bypass.  Status post IV iron infusion. Did not tolerate  barimelt+ iron. Proceed with venofer today.  #Etiology of iron deficiency: Malabsorption/gastric bypass. Incidental CT February 2024-  possible mucosal lesion at the distal small bowel anastomosis status post gastric bypass. S/p  EGD/colo/ SBE [Dr.Vanga]-  stable.   # B12 def [PCP]- home b12 injections-   # CAD/A.fib-on Xarelto/amiodarone.   Stable;   # Hypocalcemia- 8.4; recommend ca+vit D awaiting Vit D levels today.   # Obesity- awaiting to start GLP-1 pills.   # DISPOSITION: # venofer today # follow up in 4  months- MD: labs- cbc;bmp; iron studies; ferritin; vit D 25-OH possible venofer-Dr.B

## 2023-02-28 NOTE — Patient Instructions (Signed)

## 2023-02-28 NOTE — Progress Notes (Signed)
Fatigue/weakness: YES Dyspena: YES, WORSE SINCE SURGERY Light headedness: NO Blood in stool: NO  Had reverse replacement of the lt shoulder 02/20/23, x2. Pain 7/10. Dr. Allena Katz.  11/17/22 had heart ablation.

## 2023-02-28 NOTE — Progress Notes (Signed)
[facility] CONSULT NOTE  Patient Care Team: Smitty Cords, DO as PCP - General (Family Medicine) Antonieta Iba, MD as PCP - Cardiology (Cardiology) Duke Salvia, MD as PCP - Electrophysiology (Cardiology) Earna Coder, MD as Consulting Physician (Oncology)  CHIEF COMPLAINTS/PURPOSE OF CONSULTATION: ANEMIA   HEMATOLOGY HISTORY  # ANEMIA[Hb; MCV-platelets- WBC; Iron sat; ferritin;  GFR- CT/US- ;  EGD- none/colonoscopy-2019 [Dr.Vanga]  # 2005- Gastric by pass [BL-400; 233; [facility]]  # Prostate cancer [seed implant]- no surgery/ Dr.Wolfe.    Latest Reference Range & Units 03/22/22 11:59  WBC 4.0 - 10.5 K/uL 7.4  RBC 4.22 - 5.81 MIL/uL 5.03  Hemoglobin 13.0 - 17.0 g/dL 91.4 (L)  HCT 78.2 - 95.6 % 35.1 (L)  MCV 80.0 - 100.0 fL 69.8 (L)  MCH 26.0 - 34.0 pg 20.5 (L)  MCHC 30.0 - 36.0 g/dL 21.3 (L)  RDW 08.6 - 57.8 % 17.8 (H)  Platelets 150 - 400 K/uL 250  (L): Data is abnormally low (H): Data is abnormally high   HISTORY OF PRESENTING ILLNESS: Patient ambulating-independently.  Alone.   [name] 68 y.o.  male pleasant patient with Hx of prostate cancer; and history of symptomatic anemia s/p gastric bypass here for follow-up of anemia.  Patient noted to have worsening fatigue over the last few months.  Patient also had shoulder surgery approximately 10 days ago.  Complains of pain from surgery.  Of note he also had a heart ablation in November 2024.  Patient s/p Venofer. Noted to have improvement of energy levels.   Denies any blood in stools.    Review of Systems  Constitutional:  Positive for malaise/fatigue. Negative for chills, diaphoresis, fever and weight loss.  HENT:  Negative for nosebleeds and sore throat.   Eyes:  Negative for double vision.  Respiratory:  Positive for shortness of breath. Negative for cough, hemoptysis, sputum production and wheezing.   Cardiovascular:  Negative for chest pain, palpitations, orthopnea  and leg swelling.  Gastrointestinal:  Positive for blood in stool and diarrhea. Negative for abdominal pain, constipation, heartburn, melena, nausea and vomiting.  Genitourinary:  Negative for dysuria, frequency and urgency.  Musculoskeletal:  Positive for back pain and joint pain.  Skin: Negative.  Negative for itching and rash.  Neurological:  Negative for dizziness, tingling, focal weakness, weakness and headaches.  Endo/Heme/Allergies:  Does not bruise/bleed easily.  Psychiatric/Behavioral:  Negative for depression. The patient is not nervous/anxious and does not have insomnia.     MEDICAL HISTORY:  Past Medical History:  Diagnosis Date   (HFpEF) heart failure with preserved ejection fraction (HCC)    a.) TTE 12/26/2013: EF 45-50%, mild ant and antsept HK. mild MR. Mod dil LA. nl RV fxn. Rhythm was Afib; b.) TTE 07/04/2019: EF 55%, mid-apical anteroseptal HK, mild MAC, mild Ao sclerosis, G2DD, RVSP 45.3; c.) TTE 06/02/2022: EF 55-60%, no RWMAs, sev LAE, RVSP 37.9, mild-mod MR, mild AR, AoV sclerosis without stenosis, asc Ao 38 mm   Anxiety    Aortic atherosclerosis (HCC)    Cardiomyopathy (in setting of Afib)    a.) TTE 12/26/2013: EF 45-50%; b.) TTE 07/04/2019: EF 55%; c.) TTE 06/02/2022: EF 55-60%   Chronic pain syndrome    a.) followed by pain management   Chronic, continuous use of opioids    a.) on COT; followed by pain management   Coronary artery disease 11/06/2014   a.) cCTA 11/06/2014: Ca2+ = 224 (74th %ile; LAD distribution)   Diverticulosis  DJD (degenerative joint disease) of knee    Former smoker    History of hiatal hernia    History of kidney stones 2012   Hyperlipidemia    Hyperplastic colon polyp    Hypertension    Inguinal hernia, left    Internal hemorrhoids    Intervertebral disc disorder with radiculopathy of lumbosacral region    Lesion of bone of lumbosacral spine    L5   Long term current use of amiodarone    Lower extremity edema    Mixed  hyperlipidemia    Morbid obesity (HCC)    Multiple falls    Myalgia due to statin    On rivaroxaban therapy    Osteoarthritis    PAF (paroxysmal atrial fibrillation) (HCC)    a.) CHA2DS2VASc = 4 (age, HTN, CHF, vascular disease) as of 02/19/23;  b.) s/p DCCV 03/13/14 (200 J), 05/15/19 (150 J x 1, 200 J x2), 05/19/2022 (150 J x1, 200 J x2; pads changed to ant/lat postion with additional 200 J x 2 -> did not convert), 07/25/22 (200 J), 09/21/2022 (200 J); c.) s/p PVI ablation 11/17/2022; d.) rate/rhythm maintained on amiodarone; chronically anticoagulated on rivaroxaban   Pernicious anemia    Prostate cancer (HCC)    Pulmonary nodule, right    a. 10/2014 Cardiac CTA: 7mm RLL nodule; b. 04/2015 CT Chest: stable 7mm RLL nodule. No new nodules; 02/2017 CTA Chest: stable, benign, 7mm RLL pulm nodule.   Rotator cuff tear, left    Sleep difficulties    a.) takes melatonin + trazodone PRN   SVT (supraventricular tachycardia) (HCC)    Tubular adenoma of colon     SURGICAL HISTORY: Past Surgical History:  Procedure Laterality Date   ATRIAL FIBRILLATION ABLATION N/A 11/17/2022   Procedure: ATRIAL FIBRILLATION ABLATION; Location: [facility]; Surgeon: Sheran Luz, MD   BICEPT TENODESIS Right 09/27/2021   Procedure: Right reverse shoulder arthroplasty, biceps tenodesis;  Surgeon: Signa Kell, MD;  Location: [facility];  Service: Orthopedics;  Laterality: Right;   CARDIOVERSION N/A 05/15/2019   Procedure: CARDIOVERSION;  Surgeon: Antonieta Iba, MD;  Location: [facility];  Service: Cardiovascular;  Laterality: N/A;   CARDIOVERSION N/A 03/13/2014   Procedure: CARDIOVERSION; Location: [facility]; Surgeon: [name], MD   CARDIOVERSION N/A 03/24/2022   Procedure: CARDIOVERSION;  Surgeon: Antonieta Iba, MD;  Location: [facility];  Service: Cardiovascular;  Laterality: N/A;   CARDIOVERSION N/A 05/19/2022   Procedure: CARDIOVERSION;  Surgeon: Antonieta Iba, MD;  Location: [facility];  Service: Cardiovascular;   Laterality: N/A;   CARDIOVERSION N/A 07/25/2022   Procedure: CARDIOVERSION;  Surgeon: Duke Salvia, MD;  Location: [facility];  Service: Cardiovascular;  Laterality: N/A;   CARDIOVERSION N/A 09/21/2022   Procedure: CARDIOVERSION;  Surgeon: Duke Salvia, MD;  Location: [facility];  Service: Cardiovascular;  Laterality: N/A;   CARPAL TUNNEL RELEASE Right 12/23/2014   Procedure: CARPAL TUNNEL RELEASE;  Surgeon: Deeann Saint, MD;  Location: [facility];  Service: Orthopedics;  Laterality: Right;   CARPAL TUNNEL RELEASE Left 01/06/2015   Procedure: CARPAL TUNNEL RELEASE;  Surgeon: Deeann Saint, MD;  Location: [facility];  Service: Orthopedics;  Laterality: Left;   COLONOSCOPY WITH PROPOFOL N/A 10/08/2017   Procedure: COLONOSCOPY WITH PROPOFOL;  Surgeon: Toney Reil, MD;  Location[facility];  Service: Gastroenterology;  Laterality: N/A;   COLONOSCOPY WITH PROPOFOL N/A 06/07/2022   Procedure: COLONOSCOPY WITH PROPOFOL;  Surgeon: Toney Reil, MD;  Location: [facility] ENDOSCOPY;  Service: Gastroenterology;  Laterality: N/A;  ENTEROSCOPY  06/07/2022   Procedure: ENTEROSCOPY;  Surgeon: Toney Reil, MD;  Location: [facility] ENDOSCOPY;  Service: Gastroenterology;;   ESOPHAGOGASTRODUODENOSCOPY (EGD) WITH PROPOFOL N/A 06/07/2022   Procedure: ESOPHAGOGASTRODUODENOSCOPY (EGD) WITH PROPOFOL;  Surgeon: Toney Reil, MD;  Location: [facility] ENDOSCOPY;  Service: Gastroenterology;  Laterality: N/A;   GASTRIC BYPASS  01/17/2000   KNEE SURGERY Right    knee trauma x3   prostate seeding     REVERSE SHOULDER ARTHROPLASTY Right 09/27/2021   Procedure: Right reverse shoulder arthroplasty, biceps tenodesis;  Surgeon: Signa Kell, MD;  Location: [facility];  Service: Orthopedics;  Laterality: Right;   REVERSE SHOULDER ARTHROPLASTY Left 02/20/2023   Procedure: Left reverse shoulder arthroplasty, biceps tenodesis;  Surgeon: Signa Kell, MD;  Location: [facility];  Service: Orthopedics;  Laterality: Left;    SHOULDER ARTHROSCOPY WITH ROTATOR CUFF REPAIR AND OPEN BICEPS TENODESIS Left 12/27/2021   Procedure: Left shoulder arthroscopic cuff repair (supraspinatus and subscapularis) with Regeneten Patch application;  Surgeon: Signa Kell, MD;  Location: [facility];  Service: Orthopedics;  Laterality: Left;    SOCIAL HISTORY: Social History   Socioeconomic History   Marital status: Married    Spouse name: Nelva Bush   Number of children: Not on file   Years of education: Not on file   Highest education level: Some college, no degree  Occupational History   Not on file  Tobacco Use   Smoking status: Former    Current packs/day: 0.00    Average packs/day: 1 pack/day for 10.0 years (10.0 ttl pk-yrs)    Types: Cigarettes    Start date: 02/17/1969    Quit date: 02/18/1979    Years since quitting: 44.0   Smokeless tobacco: Former    Types: Snuff  Vaping Use   Vaping status: Never Used  Substance and Sexual Activity   Alcohol use: Not Currently    Comment: last drink in 2021   Drug use: No   Sexual activity: Not on file  Other Topics Concern   Not on file  Social History Narrative   Lives in [place]; with wife, Nelva Bush. Retd from [employer]; quit in 1980; used to drink alcohol; no beer in last 2022. No indoor pets.   Social Drivers of Corporate investment banker Strain: Low Risk  (01/20/2023)   Overall Financial Resource Strain (CARDIA)    Difficulty of Paying Living Expenses: Not very hard  Food Insecurity: No Food Insecurity (01/20/2023)   Hunger Vital Sign    Worried About Running Out of Food in the Last Year: Never true    Ran Out of Food in the Last Year: Never true  Transportation Needs: No Transportation Needs (01/20/2023)   PRAPARE - Administrator, Civil Service (Medical): No    Lack of Transportation (Non-Medical): No  Physical Activity: Unknown (01/20/2023)   Exercise Vital Sign    Days of Exercise per Week: 0 days    Minutes of Exercise per Session: Not on file   Recent Concern: Physical Activity - Inactive (01/20/2023)   Exercise Vital Sign    Days of Exercise per Week: 0 days    Minutes of Exercise per Session: 60 min  Stress: Stress Concern Present (01/20/2023)   [employer] of Occupational Health - Occupational Stress Questionnaire    Feeling of Stress : To some extent  Social Connections: Socially Integrated (01/20/2023)   Social Connection and Isolation Panel [NHANES]    Frequency of Communication with Friends and Family: More than three times a week  Frequency of Social Gatherings with Friends and Family: More than three times a week    Attends Religious Services: More than 4 times per year    Active Member of [employer] or Organizations: Yes    Attends Engineer, structural: More than 4 times per year    Marital Status: Married  Catering manager Violence: Not At Risk (04/26/2022)   Humiliation, Afraid, Rape, and Kick questionnaire    Fear of Current or Ex-Partner: No    Emotionally Abused: No    Physically Abused: No    Sexually Abused: No    FAMILY HISTORY: Family History  Problem Relation Age of Onset   Heart disease Father    Hypertension Father    Stroke Father    Cancer Father        prostate cancer   Cancer Sister    Arrhythmia Sister        A-fib   Arrhythmia Brother        A-fib   Cancer Brother     ALLERGIES:  is allergic to bupivacaine liposome.  MEDICATIONS:  Current Outpatient Medications  Medication Sig Dispense Refill   acetaminophen (TYLENOL) 500 MG tablet Take 2 tablets (1,000 mg total) by mouth every 8 (eight) hours. 90 tablet 2   amiodarone (PACERONE) 200 MG tablet TAKE ONE TABLET BY MOUTH TWICE A DAY (Patient taking differently: Take 200 mg by mouth in the morning.) 180 tablet 2   amLODipine (NORVASC) 2.5 MG tablet Take 1 tablet (2.5 mg total) by mouth daily. 90 tablet 3   benazepril (LOTENSIN) 40 MG tablet TAKE ONE TABLET BY MOUTH ONE TIME DAILY (Patient taking differently: Take 40 mg by mouth  every morning. TAKE ONE TABLET BY MOUTH ONE TIME DAILY) 30 tablet 6   busPIRone (BUSPAR) 10 MG tablet TAKE ONE TABLET BY MOUTH FOUR TIMES A DAY AS NEEDED FOR ANIXETY (Patient taking differently: Take 10 mg by mouth every morning. And PRN) 360 tablet 1   carboxymethylcellulose (REFRESH PLUS) 0.5 % SOLN Place 1-2 drops into both eyes 3 (three) times daily as needed (dry/irritated eyes.).     cyanocobalamin (VITAMIN B12) 1000 MCG/ML injection INJECT INTO THE SKIN EVERY 30 DAYS AS DIRECTED (Patient taking differently: Inject 1,000 mcg into the skin every 30 (thirty) days.) 1 mL PRN   cyclobenzaprine (FLEXERIL) 10 MG tablet Take 1 tablet (10 mg total) by mouth at bedtime. 90 tablet 3   DULoxetine (CYMBALTA) 60 MG capsule Take 1 capsule (60 mg total) by mouth daily. (Patient taking differently: Take 60 mg by mouth every morning.) 90 capsule 1   FERROUS BISGLYCINATE CHELATE PO Take 25 mg by mouth daily. Bronson Iron Bisglycinate 25 mg Gentle on The Stomach Supports Energy & Healthy Red Blood Cell Production     fluticasone (FLONASE) 50 MCG/ACT nasal spray USE TWO SPRAYS IN EACH NOSTRIL ONE TIME DAILY (Patient taking differently: Place 2 sprays into both nostrils daily as needed for allergies.) 16 mL 1   HYDROcodone-acetaminophen (NORCO) 7.5-325 MG tablet Take 1 tablet by mouth every 6 (six) hours as needed for severe pain (pain score 7-10). Must last 30 days 120 tablet 0   [START ON 03/19/2023] HYDROcodone-acetaminophen (NORCO) 7.5-325 MG tablet Take 1 tablet by mouth every 6 (six) hours as needed for severe pain (pain score 7-10). Must last 30 days 120 tablet 0   [START ON 04/18/2023] HYDROcodone-acetaminophen (NORCO) 7.5-325 MG tablet Take 1 tablet by mouth every 6 (six) hours as needed for severe pain (pain  score 7-10). Must last 30 days 120 tablet 0   melatonin 3 MG TABS tablet Take 3 mg by mouth at bedtime.     Multiple Vitamin (MULTIVITAMIN WITH MINERALS) TABS tablet Take 1 tablet by mouth in the morning.      ondansetron (ZOFRAN-ODT) 4 MG disintegrating tablet Take 1 tablet (4 mg total) by mouth every 8 (eight) hours as needed for nausea or vomiting. 20 tablet 0   rivaroxaban (XARELTO) 20 MG TABS tablet TAKE ONE TABLET BY MOUTH ONE TIME DAILY WITH SUPPER (Patient taking differently: Take 20 mg by mouth at bedtime.) 90 tablet 2   traZODone (DESYREL) 150 MG tablet TAKE ONE TABLET BY MOUTH AT BEDTIME 90 tablet 1   oxyCODONE (ROXICODONE) 5 MG immediate release tablet Take 1-2 tablets (5-10 mg total) by mouth every 4 (four) hours as needed (pain). (Patient not taking: Reported on 02/28/2023) 30 tablet 0   ZEPBOUND 2.5 MG/0.5ML Pen Inject 2.5 mg into the skin once a week. (Patient not taking: Reported on 02/28/2023) 2 mL 0   No current facility-administered medications for this visit.     PHYSICAL EXAMINATION:   Vitals:   02/28/23 1359  BP: (!) 135/96  Pulse: 88  Temp: (!) 97.3 F (36.3 C)  SpO2: 95%    Filed Weights   02/28/23 1359  Weight: (!) 349 lb 3.2 oz (158.4 kg)     Physical Exam Vitals and nursing note reviewed.  HENT:     Head: Normocephalic and atraumatic.     Mouth/Throat:     Pharynx: Oropharynx is clear.  Eyes:     Extraocular Movements: Extraocular movements intact.     Pupils: Pupils are equal, round, and reactive to light.  Cardiovascular:     Rate and Rhythm: Normal rate and regular rhythm.  Pulmonary:     Comments: Decreased breath sounds bilaterally.  Abdominal:     Palpations: Abdomen is soft.  Musculoskeletal:        General: Normal range of motion.     Cervical back: Normal range of motion.  Skin:    General: Skin is warm.  Neurological:     General: No focal deficit present.     Mental Status: He is alert and oriented to person, place, and time.  Psychiatric:        Behavior: Behavior normal.        Judgment: Judgment normal.      LABORATORY DATA:  I have reviewed the data as listed Lab Results  Component Value Date   WBC 7.8 02/28/2023    HGB 13.2 02/28/2023   HCT 40.2 02/28/2023   MCV 86.8 02/28/2023   PLT 283 02/28/2023   Recent Labs    04/26/22 1400 05/15/22 1459 10/30/22 1245 02/12/23 1456 02/28/23 1353  NA 138   < > 139 139 139  K 4.4   < > 4.2 4.5 4.2  CL 109   < > 107 106 105  CO2 24   < > 27 24 25   GLUCOSE 163*   < > 103* 85 128*  BUN 18   < > 12 13 13   CREATININE 1.13   < > 1.12 0.95 1.00  CALCIUM 8.6*   < > 8.8* 8.8* 8.4*  GFRNONAA >60   < > >60 >60 >60  PROT 6.8  --   --  6.6  --   ALBUMIN 4.1  --   --  4.2  --   AST 19  --   --  18  --   ALT 13  --   --  15  --   ALKPHOS 68  --   --  71  --   BILITOT 0.5  --   --  0.6  --    < > = values in this interval not displayed.     DG Shoulder 1V Left Result Date: 02/20/2023 CLINICAL DATA:  Status post reverse total left shoulder arthroplasty. EXAM: LEFT SHOULDER COMPARISON:  Left shoulder radiographs 03/29/2021, CT left shoulder 10/09/2022 FINDINGS: Interval reverse total left shoulder arthroplasty. No perihardware lucency is seen on limited frontal view to indicate hardware failure or loosening. Expected postoperative intra-articular and subacromial/subdeltoid bursal air. No acute fracture or dislocation. IMPRESSION: Interval reverse total left shoulder arthroplasty without evidence of hardware failure. Electronically Signed   By: Neita Garnet M.D.   On: 02/20/2023 12:26   [place] OR NERVE BLOCK-IMAGE ONLY [facility]) Result Date: 02/20/2023 There is no interpretation for this exam.  This order is for images obtained during a surgical procedure.  Please See "Surgeries" Tab for more information regarding the procedure.    ASSESSMENT & PLAN:   Symptomatic anemia # Anemia- Hb-symptomatic.  Likely due to iron deficiency -likely secondary to malabsorption/Hx of gastric bypass.  Status post IV iron infusion. Did not tolerate  barimelt+ iron. Proceed with venofer today.  #Etiology of iron deficiency: Malabsorption/gastric bypass. Incidental CT February 2024-  possible  mucosal lesion at the distal small bowel anastomosis status post gastric bypass. S/p  EGD/colo/ SBE [Dr.Vanga]-  stable.   # B12 def [PCP]- home b12 injections-   # CAD/A.fib-on Xarelto/amiodarone.   Stable;   # Hypocalcemia- 8.4; recommend ca+vit D awaiting Vit D levels today.   # Obesity- awaiting to start GLP-1 pills.   # DISPOSITION: # venofer today # follow up in 4  months- MD: labs- cbc;bmp; iron studies; ferritin; vit D 25-OH possible venofer-Dr.B   All questions were answered. The patient knows to call the clinic with any problems, questions or concerns.  Earna Coder, MD 02/28/2023 2:51 PM

## 2023-02-28 NOTE — Progress Notes (Signed)
30 minute post observation. Aware of risks. Vitals stable at discharge.

## 2023-03-01 ENCOUNTER — Telehealth: Payer: Self-pay | Admitting: *Deleted

## 2023-03-01 NOTE — Telephone Encounter (Signed)
.   Called and said that when Denton saw the pt. [id number] that md was going to send a RX vitamin D. He does not see it at pharmacy.

## 2023-03-02 ENCOUNTER — Ambulatory Visit: Payer: Medicare Other

## 2023-03-02 ENCOUNTER — Encounter: Payer: Self-pay | Admitting: Orthopedic Surgery

## 2023-03-05 ENCOUNTER — Other Ambulatory Visit: Payer: Self-pay | Admitting: Family Medicine

## 2023-03-05 ENCOUNTER — Ambulatory Visit: Payer: Medicare Other

## 2023-03-05 DIAGNOSIS — F419 Anxiety disorder, unspecified: Secondary | ICD-10-CM

## 2023-03-05 NOTE — Therapy (Incomplete)
OUTPATIENT PHYSICAL THERAPY SHOULDER TREATMENT  Patient Name: [name] MRN: [id number] DOB:09/04/1955, 68 y.o., male Today's Date: 03/05/2023  END OF SESSION:   Past Medical History:  Diagnosis Date   (HFpEF) heart failure with preserved ejection fraction (HCC)    a.) TTE 12/26/2013: EF 45-50%, mild ant and antsept HK. mild MR. Mod dil LA. nl RV fxn. Rhythm was Afib; b.) TTE 07/04/2019: EF 55%, mid-apical anteroseptal HK, mild MAC, mild Ao sclerosis, G2DD, RVSP 45.3; c.) TTE 06/02/2022: EF 55-60%, no RWMAs, sev LAE, RVSP 37.9, mild-mod MR, mild AR, AoV sclerosis without stenosis, asc Ao 38 mm   Anxiety    Aortic atherosclerosis (HCC)    Cardiomyopathy (in setting of Afib)    a.) TTE 12/26/2013: EF 45-50%; b.) TTE 07/04/2019: EF 55%; c.) TTE 06/02/2022: EF 55-60%   Chronic pain syndrome    a.) followed by pain management   Chronic, continuous use of opioids    a.) on COT; followed by pain management   Coronary artery disease 11/06/2014   a.) cCTA 11/06/2014: Ca2+ = 224 (74th %ile; LAD distribution)   Diverticulosis    DJD (degenerative joint disease) of knee    Former smoker    History of hiatal hernia    History of kidney stones 2012   Hyperlipidemia    Hyperplastic colon polyp    Hypertension    Inguinal hernia, left    Internal hemorrhoids    Intervertebral disc disorder with radiculopathy of lumbosacral region    Lesion of bone of lumbosacral spine    L5   Long term current use of amiodarone    Lower extremity edema    Mixed hyperlipidemia    Morbid obesity (HCC)    Multiple falls    Myalgia due to statin    On rivaroxaban therapy    Osteoarthritis    PAF (paroxysmal atrial fibrillation) (HCC)    a.) CHA2DS2VASc = 4 (age, HTN, CHF, vascular disease) as of 02/19/23;  b.) s/p DCCV 03/13/14 (200 J), 05/15/19 (150 J x 1, 200 J x2), 05/19/2022 (150 J x1, 200 J x2; pads changed to ant/lat postion with additional 200 J x 2 -> did not convert), 07/25/22 (200 J), 09/21/2022  (200 J); c.) s/p PVI ablation 11/17/2022; d.) rate/rhythm maintained on amiodarone; chronically anticoagulated on rivaroxaban   Pernicious anemia    Prostate cancer (HCC)    Pulmonary nodule, right    a. 10/2014 Cardiac CTA: 7mm RLL nodule; b. 04/2015 CT Chest: stable 7mm RLL nodule. No new nodules; 02/2017 CTA Chest: stable, benign, 7mm RLL pulm nodule.   Rotator cuff tear, left    Sleep difficulties    a.) takes melatonin + trazodone PRN   SVT (supraventricular tachycardia) (HCC)    Tubular adenoma of colon    Past Surgical History:  Procedure Laterality Date   ATRIAL FIBRILLATION ABLATION N/A 11/17/2022   Procedure: ATRIAL FIBRILLATION ABLATION; Location: [facility]; Surgeon: Sheran Luz, MD   BICEPT TENODESIS Right 09/27/2021   Procedure: Right reverse shoulder arthroplasty, biceps tenodesis;  Surgeon: Signa Kell, MD;  Location: [facility];  Service: Orthopedics;  Laterality: Right;   CARDIOVERSION N/A 05/15/2019   Procedure: CARDIOVERSION;  Surgeon: Antonieta Iba, MD;  Location: [facility];  Service: Cardiovascular;  Laterality: N/A;   CARDIOVERSION N/A 03/13/2014   Procedure: CARDIOVERSION; Location: [facility]; Surgeon: [name], MD   CARDIOVERSION N/A 03/24/2022   Procedure: CARDIOVERSION;  Surgeon: Antonieta Iba, MD;  Location: [facility];  Service: Cardiovascular;  Laterality: N/A;  CARDIOVERSION N/A 05/19/2022   Procedure: CARDIOVERSION;  Surgeon: Antonieta Iba, MD;  Location: [facility];  Service: Cardiovascular;  Laterality: N/A;   CARDIOVERSION N/A 07/25/2022   Procedure: CARDIOVERSION;  Surgeon: Duke Salvia, MD;  Location: [facility];  Service: Cardiovascular;  Laterality: N/A;   CARDIOVERSION N/A 09/21/2022   Procedure: CARDIOVERSION;  Surgeon: Duke Salvia, MD;  Location: [facility];  Service: Cardiovascular;  Laterality: N/A;   CARPAL TUNNEL RELEASE Right 12/23/2014   Procedure: CARPAL TUNNEL RELEASE;  Surgeon: Deeann Saint, MD;  Location: [facility];  Service:  Orthopedics;  Laterality: Right;   CARPAL TUNNEL RELEASE Left 01/06/2015   Procedure: CARPAL TUNNEL RELEASE;  Surgeon: Deeann Saint, MD;  Location: [facility];  Service: Orthopedics;  Laterality: Left;   COLONOSCOPY WITH PROPOFOL N/A 10/08/2017   Procedure: COLONOSCOPY WITH PROPOFOL;  Surgeon: Toney Reil, MD;  Location: [facility];  Service: Gastroenterology;  Laterality: N/A;   COLONOSCOPY WITH PROPOFOL N/A 06/07/2022   Procedure: COLONOSCOPY WITH PROPOFOL;  Surgeon: Toney Reil, MD;  Location: [facility]COPY;  Service: Gastroenterology;  Laterality: N/A;   ENTEROSCOPY  06/07/2022   Procedure: ENTEROSCOPY;  Surgeon: Toney Reil, MD;  Location: [facility] ENDOSCOPY;  Service: Gastroenterology;;   ESOPHAGOGASTRODUODENOSCOPY (EGD) WITH PROPOFOL N/A 06/07/2022   Procedure: ESOPHAGOGASTRODUODENOSCOPY (EGD) WITH PROPOFOL;  Surgeon: Toney Reil, MD;  Location: [facility] ENDOSCOPY;  Service: Gastroenterology;  Laterality: N/A;   GASTRIC BYPASS  01/17/2000   KNEE SURGERY Right    knee trauma x3   prostate seeding     REVERSE SHOULDER ARTHROPLASTY Right 09/27/2021   Procedure: Right reverse shoulder arthroplasty, biceps tenodesis;  Surgeon: Signa Kell, MD;  Location: [facility];  Service: Orthopedics;  Laterality: Right;   REVERSE SHOULDER ARTHROPLASTY Left 02/20/2023   Procedure: Left reverse shoulder arthroplasty, biceps tenodesis;  Surgeon: Signa Kell, MD;  Location: [facility];  Service: Orthopedics;  Laterality: Left;   SHOULDER ARTHROSCOPY WITH ROTATOR CUFF REPAIR AND OPEN BICEPS TENODESIS Left 12/27/2021   Procedure: Left shoulder arthroscopic cuff repair (supraspinatus and subscapularis) with Regeneten Patch application;  Surgeon: Signa Kell, MD;  Location: [facility];  Service: Orthopedics;  Laterality: Left;   Patient Active Problem List   Diagnosis Date Noted   Symptomatic anemia 04/26/2022   Chronic pain of both knees 03/30/2022   Bilateral primary osteoarthritis  of knee 03/30/2022   Shortness of breath 03/24/2022   Vitamin D deficiency 02/24/2022   S/p reverse total shoulder arthroplasty 09/27/2021   Lesion of bone of lumbosacral spine (L5) 05/12/2021   Localized osteoarthritis of shoulder regions, bilateral 03/29/2021   Chronic pain of both shoulders 03/29/2021   Drug-induced myopathy 02/21/2021   Trigger finger of right hand 11/15/2020   Prostate cancer (HCC) 07/26/2020   Insomnia 08/01/2019   Primary osteoarthritis of both wrists 02/17/2019   Trigger middle finger of left hand 02/17/2019   Chronic pain syndrome 02/17/2019   Chronic radicular lumbar pain 10/08/2018   Lumbar radiculopathy 10/08/2018   Lumbar degenerative disc disease 10/08/2018   Lumbar facet arthropathy 10/08/2018   Lumbar facet joint syndrome 10/08/2018   Intervertebral disc disorder with radiculopathy of lumbosacral region    Osteoarthritis of knee 11/30/2017   Osteoarthritis of wrist 11/30/2017   Pernicious anemia 05/22/2016   Persistent atrial fibrillation (HCC) 12/20/2015   Carpal tunnel syndrome 11/16/2014   DJD (degenerative joint disease) of knee 10/27/2014   Bilateral carpal tunnel syndrome 10/27/2014   Morbid obesity with BMI of 45.0-49.9, adult (HCC) 10/27/2014   Mixed hyperlipidemia  10/27/2014   H/O gastric bypass 03/20/2014   Hyperkalemia 03/20/2014   History of prostate cancer 12/26/2013   Essential hypertension 12/26/2013   Encounter for anticoagulation discussion and counseling 12/26/2013    PCP: Dr. Althea Charon  REFERRING PROVIDER: Dr. Signa Kell  REFERRING DIAG: [phone] (ICD-10-CM) - Complete rotator cuff tear or rupture of left shoulder, not specified as traumatic   RATIONALE FOR EVALUATION AND TREATMENT: Rehabilitation  THERAPY DIAG: Chronic left shoulder pain  Muscle weakness (generalized)  ONSET DATE: 02/20/23  FOLLOW-UP APPT SCHEDULED WITH REFERRING PROVIDER: Yes   FROM INITIAL EVALUATION SUBJECTIVE:                                                                                                                                                                                          SUBJECTIVE STATEMENT:  L reverse TSR 02/20/23  PERTINENT HISTORY:  ARCHIBALD MARCHETTA is a 68 y.o. male who initially underwent arthroscopic subscapularis repair and side-to-side supraspinatus repair with Regeneten patch application by Dr. Allena Katz 12/27/2021. He underwent post-surgical physical therapy with initial minimal improvement followed by worsening shoulder strength/function. Repeat MRI showed large, retracted subscapularis re-tear and supraspinatus re-tear with signs of rotator cuff arthropathy. Conservative measures including medications, physical therapy, and cortisone injections did not provide adequate relief. The patient elected to proceed with reverse shoulder arthroplasty and biceps tenodesis on 02/20/23. Intraoperative findings include retracted full-thickness subscapularis tear, partial-thickness supraspinatus tear, and biceps tendinopathy. Surgery was a standard deltopectoral incision under general anesthesia along with an interscalene block. Per op note, operative arm to remain in sling at all times except ROM exercises and hygiene. Can perform pendulums, elbow/wrist/hand ROM exercises. Passive ROM allowed to 90 FF and 30 ER. ASA 325mg  x 6 weeks for DVT ppx (pt states that he was actually instructed to start back on his Xarelto and not to use ASA). Plan for PT starting on POD #3-4. Pt denies any post-operative complications from surgery. Pt has a history of chronic back pain and has undergone multiple lumbar facet, medial branch radiofrequency ablations. He also has a past history of cardiomyopathy (in setting of Afib), DJD (degenerative joint disease) of knee, kidney stones, lower extremity edema, PAF (paroxysmal atrial fibrillation), pernicious anemia, prostate cancer, and a pulmonary nodule. He also has a past surgical history that includes R reverse  TSR, knee surgery, gastric bypass (2002), prostate seeding, carpal tunnel release (Right, 12/23/2014; Left, 01/06/2015), and multiple cardioversions.   PAIN:  Pain Intensity: No resting pain upon arrival Pain location: L shoulder Pain Quality: "Nerve pain" Radiating: No  Numbness/Tingling: No Focal Weakness: Yes 24-hour pain behavior: Varies History of prior shoulder or neck/shoulder injury,  pain, surgery, or therapy: Yes PT for L shoulder s/p RTC repair, history of R reverse TSR; Dominant hand: right Red flags Positive: history of prostate CA, Negative: chills/fever, night sweats, nausea, vomiting, unrelenting pain, unexplained weight gain/loss  PRECAUTIONS: Shoulder, per operative note: operative arm to remain in sling at all times except ROM exercises and hygiene. Can perform pendulums, elbow/wrist/hand ROM exercises. Passive ROM allowed to 90 FF and 30 ER.   General reverse TSR precautions include no combined shoulder extension, IR, and adduction. No shoulder and elbow AROM (bicep tenodesis) for at least the first 4 weeks   WEIGHT BEARING RESTRICTIONS: Yes no WB through LUE  FALLS: Has patient fallen in last 6 months? 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  Recent memory is intact.  Remote memory is intact.  Attention span and  concentration are intact.  Expressive speech is intact.  Patient's fund of knowledge is within normal limits for educational level.    Gross Musculoskeletal Assessment Tremor: None Bulk: Normal Tone: Normal Incision is clean and dry with minimal blood staining on honeycomb dressing as well as minimal swelling around the incision. Skin is cool to touch and no signs of infection present.   Gait Deferred full gait assessment  Posture Forward head  with upper thoracic kyphosis and rounded shoulders  Cervical Screen Deferred  ROM ROM (Normal range in degrees)    Right (AROM) Left (PROM)  Shoulder    Flexion 160 90 (limited per protocol)  Extension    Abduction 180 90 (scaption)  External Rotation 88 0  Internal Rotation 70 stomach  Hands Behind Head    Hands Behind Back        Elbow    Flexion Full Full  Extension 0 0  Pronation WNL Full palm up  Supination WNL Full palm down  (* = pain; Blank rows = not tested)  UE MMT: MMT (out of 5) Right Left   Shoulder   Flexion 5   Extension    Abduction 5   External rotation 5   Internal rotation 5   Horizontal abduction    Horizontal adduction    Lower Trapezius    Rhomboids        Elbow  Flexion 5   Extension 5   Pronation 5   Supination 5       Wrist  Flexion 5 5  Extension 5 5  Radial deviation  5  Ulnar deviation  5      MCP  Flexion 5 5  Extension 5 5  Abduction 5 5  Adduction 5 5  (* = pain; Blank rows = not tested)  Sensation Deferred  Reflexes Deferred  Palpation No tenderness to light palpation around entire L shoulder girdle;  Passive Accessory Intervertebral Motion Deferred  Accessory Motions/Glides Deferred  Muscle Length Testing Deferred  SPECIAL TESTS Deferred  Beighton scale Deferred   TODAY'S TREATMENT  Sensation testing, progress L shoulder  per surgical protocol;  SUBJECTIVE: Pt reports that he is doing well today. No changes since the initial evaluation. Denies  resting pain upon arrival. No specific questions or concerns.    PAIN:   Manual Therapy  L shoulder PROM into flexion and scaption stopping at 90 degrees FF; L shoulder PROM ER stopping at 0 secondary to pain; L wrist AROM flexion and extension; HEP issued and reviewed with patient;   Ther-ex        PATIENT EDUCATION:  Education details: Plan of care and HEP Person educated: Patient Education method: Explanation, Actor cues, Verbal cues, and Handouts Education comprehension: verbalized understanding and returned demonstration   HOME EXERCISE PROGRAM:  Access Code: [email or web address] URL: [email or web address] Date: 02/28/2023 Prepared by: Ria Comment  Exercises - Seated Cervical Sidebending Stretch (Mirrored)  - 2 x daily - 7 x weekly - 3 reps - 30s hold - Seated Scapular Retraction  - 2 x daily - 7 x weekly - 2 sets - 10 reps - 3s hold - Circular Shoulder Pendulum with Table Support  - 2 x daily - 7 x weekly - 3 reps - 60s hold - Horizontal Shoulder Pendulum with Table Support  - 2 x daily - 7 x weekly - 3 reps - 60s hold - Flexion-Extension Shoulder Pendulum with Table Support (Mirrored)  - 2 x daily - 7 x weekly - 3 reps - 60s hold - Wrist Flexion AROM  - 2 x daily - 7 x weekly - 2 sets - 10 reps - 3s hold - Wrist Extension AROM  - 1 x daily - 7 x weekly - 2 sets - 10 reps - 3s hold    ASSESSMENT:  CLINICAL IMPRESSION: Progressed L shoulder PROM during session today with patient. Therapy sessions are currently limited by post-operative restrictions. Reviewed HEP with patient. Pt encouraged to follow-up as scheduled. Pt will benefit from PT services to address deficits in strength, balance, and mobility in order to return to full function at home and decrease his risk for falls.    Patient is a 68 y.o. male who was seen today for physical therapy evaluation and treatment s/p L shoulder reverse TSR.   OBJECTIVE IMPAIRMENTS: decreased ROM, decreased strength,  and pain.   ACTIVITY LIMITATIONS: carrying, lifting, bathing, toileting, dressing, and reach over head  PARTICIPATION LIMITATIONS: meal prep, cleaning, laundry, driving, shopping, community activity, and yard work  PERSONAL FACTORS: Past/current experiences, Time since onset of injury/illness/exacerbation, and 3+ comorbidities: OA, anemia, anxiety, cardiomyopathy, chronic pain  are also affecting patient's functional outcome.   REHAB POTENTIAL: Good  CLINICAL DECISION MAKING: Unstable/unpredictable  EVALUATION COMPLEXITY: High   GOALS: Goals reviewed with patient? Yes  SHORT TERM GOALS: Target date: 04/09/2023  Pt will be independent with HEP to improve strength and decrease shoulder pain to improve pain-free function at home and work. Baseline:  Goal status: INITIAL   LONG TERM GOALS: Target date: 05/21/2023  1.  Pt will report no further L shoulder pain with all AROM in order to complete all ADLs and improve pain-free function. Baseline:  Goal status: INITIAL  2.  Pt will decrease QuickDASH score by at least 8% in order to demonstrate clinically significant reduction in disability related to shoulder pain        Baseline: 02/26/23: 72.7% Goal status: INITIAL  3. Pt will increase strength of L shoulder flexion and scaption to at least 4/5 in order to demonstrate improvement in strength and function  Baseline: 02/26/23: Unable to test Goal status: INITIAL  4. Pt will improve L shoulder AROM flexion and scaption to at least 110 degrees in order to demonstrate improvement in function so he can take care of his property and complete all household responsibilities;        Baseline: 02/26/23: Unable to test (90 degrees PROM for both) Goal status: INITIAL   PLAN: PT FREQUENCY: 1-2x/week  PT DURATION: 12 weeks  PLANNED INTERVENTIONS: Therapeutic exercises, Therapeutic activity, Neuromuscular re-education, Balance training, Gait training, Patient/Family education, Self Care,  Joint mobilization, Joint manipulation, Vestibular training, Canalith repositioning, Orthotic/Fit training, DME instructions, Dry Needling, Electrical stimulation, Spinal manipulation, Spinal mobilization, Cryotherapy, Moist heat, Taping, Traction, Ultrasound, Ionotophoresis 4mg /ml Dexamethasone, Manual therapy, and Re-evaluation.  PLAN FOR NEXT SESSION: Sensation testing, progress L shoulder per surgical protocol;   Sharalyn Ink Bali Lyn PT, DPT, GCS  Koreena Joost, PT 03/05/2023, 8:50 AM

## 2023-03-06 DIAGNOSIS — M75122 Complete rotator cuff tear or rupture of left shoulder, not specified as traumatic: Secondary | ICD-10-CM | POA: Diagnosis not present

## 2023-03-06 NOTE — Telephone Encounter (Signed)
Requested Prescriptions  Pending Prescriptions Disp Refills   DULoxetine (CYMBALTA) 60 MG capsule [Pharmacy Med Name: DULOXETINE DR 60 MG CAP] 90 capsule 1    Sig: TAKE ONE CAPSULE BY MOUTH ONE TIME DAILY     Psychiatry: Antidepressants - SNRI - duloxetine Passed - 03/06/2023  2:44 PM      Passed - Cr in normal range and within 360 days    Creatinine  Date Value Ref Range Status  02/28/2023 1.00 0.61 - 1.24 mg/dL Final   Creat  Date Value Ref Range Status  07/27/2021 1.13 0.70 - 1.35 mg/dL Final         Passed - eGFR is 30 or above and within 360 days    GFR, Est African American  Date Value Ref Range Status  07/21/2020 72 > OR = 60 mL/min/1.39m2 Final   GFR, Est Non African American  Date Value Ref Range Status  07/21/2020 62 > OR = 60 mL/min/1.8m2 Final   GFR, Estimated  Date Value Ref Range Status  02/28/2023 >60 >60 mL/min Final    Comment:    (NOTE) Calculated using the CKD-EPI Creatinine Equation (2021)    eGFR  Date Value Ref Range Status  08/31/2022 72 >59 mL/min/1.73 Final         Passed - Completed PHQ-2 or PHQ-9 in the last 360 days      Passed - Last BP in normal range    BP Readings from Last 1 Encounters:  02/28/23 115/78         Passed - Valid encounter within last 6 months    Recent Outpatient Visits           1 month ago Abnormal glucose   [place] [facility] Smitty Cords, DO   7 months ago Annual physical exam   [place] [facility] Smitty Cords, DO   1 year ago Persistent atrial fibrillation [facility])   [place] [facility] Smitty Cords, DO   1 year ago Left inguinal pain   [place] [facility] Smitty Cords, DO   1 year ago Anxiety   [place] [facility] Smitty Cords, DO       Future Appointments             In 2 months Duke Salvia, MD [facility] [place]   In 4  months Althea Charon, Netta Neat, DO [facility]

## 2023-03-06 NOTE — Telephone Encounter (Signed)
I called the pt. And told him that he can get vitamin D over the counter and get the pill that has 1000 mcg every day. He will go get it and start soon

## 2023-03-07 ENCOUNTER — Ambulatory Visit: Payer: Medicare Other

## 2023-03-07 NOTE — Telephone Encounter (Signed)
I reviewed his chart. I am not sure why Rybelsus was requested. My guess is he contacted insurance or pharmacy and perhaps it triggered a PA by checking coverage?  It is only approved for Type 2 Diabetes patients. He does not have Type 2 Diabetes. So we cannot proceed w/ Rybelsus authorization.  Saralyn Pilar, DO [facility] 03/07/2023, 11:50 AM

## 2023-03-07 NOTE — Telephone Encounter (Signed)
 Pt.notified

## 2023-03-26 ENCOUNTER — Ambulatory Visit: Payer: Medicare Other

## 2023-03-26 DIAGNOSIS — M6281 Muscle weakness (generalized): Secondary | ICD-10-CM

## 2023-03-26 DIAGNOSIS — G8929 Other chronic pain: Secondary | ICD-10-CM

## 2023-03-26 NOTE — Therapy (Signed)
 OUTPATIENT PHYSICAL THERAPY SHOULDER TREATMENT  Patient Name: [name] MRN: [id number] DOB:1956-01-06, 68 y.o., male Today's Date: 03/26/2023  END OF SESSION:   Past Medical History:  Diagnosis Date   (HFpEF) heart failure with preserved ejection fraction (HCC)    a.) TTE 12/26/2013: EF 45-50%, mild ant and antsept HK. mild MR. Mod dil LA. nl RV fxn. Rhythm was Afib; b.) TTE 07/04/2019: EF 55%, mid-apical anteroseptal HK, mild MAC, mild Ao sclerosis, G2DD, RVSP 45.3; c.) TTE 06/02/2022: EF 55-60%, no RWMAs, sev LAE, RVSP 37.9, mild-mod MR, mild AR, AoV sclerosis without stenosis, asc Ao 38 mm   Anxiety    Aortic atherosclerosis (HCC)    Cardiomyopathy (in setting of Afib)    a.) TTE 12/26/2013: EF 45-50%; b.) TTE 07/04/2019: EF 55%; c.) TTE 06/02/2022: EF 55-60%   Chronic pain syndrome    a.) followed by pain management   Chronic, continuous use of opioids    a.) on COT; followed by pain management   Coronary artery disease 11/06/2014   a.) cCTA 11/06/2014: Ca2+ = 224 (74th %ile; LAD distribution)   Diverticulosis    DJD (degenerative joint disease) of knee    Former smoker    History of hiatal hernia    History of kidney stones 2012   Hyperlipidemia    Hyperplastic colon polyp    Hypertension    Inguinal hernia, left    Internal hemorrhoids    Intervertebral disc disorder with radiculopathy of lumbosacral region    Lesion of bone of lumbosacral spine    L5   Long term current use of amiodarone    Lower extremity edema    Mixed hyperlipidemia    Morbid obesity (HCC)    Multiple falls    Myalgia due to statin    On rivaroxaban therapy    Osteoarthritis    PAF (paroxysmal atrial fibrillation) (HCC)    a.) CHA2DS2VASc = 4 (age, HTN, CHF, vascular disease) as of 02/19/23;  b.) s/p DCCV 03/13/14 (200 J), 05/15/19 (150 J x 1, 200 J x2), 05/19/2022 (150 J x1, 200 J x2; pads changed to ant/lat postion with additional 200 J x 2 -> did not convert), 07/25/22 (200 J), 09/21/2022  (200 J); c.) s/p PVI ablation 11/17/2022; d.) rate/rhythm maintained on amiodarone; chronically anticoagulated on rivaroxaban   Pernicious anemia    Prostate cancer (HCC)    Pulmonary nodule, right    a. 10/2014 Cardiac CTA: 7mm RLL nodule; b. 04/2015 CT Chest: stable 7mm RLL nodule. No new nodules; 02/2017 CTA Chest: stable, benign, 7mm RLL pulm nodule.   Rotator cuff tear, left    Sleep difficulties    a.) takes melatonin + trazodone PRN   SVT (supraventricular tachycardia) (HCC)    Tubular adenoma of colon    Past Surgical History:  Procedure Laterality Date   ATRIAL FIBRILLATION ABLATION N/A 11/17/2022   Procedure: ATRIAL FIBRILLATION ABLATION; Location: [facility]; Surgeon: Sheran Luz, MD   BICEPT TENODESIS Right 09/27/2021   Procedure: Right reverse shoulder arthroplasty, biceps tenodesis;  Surgeon: Signa Kell, MD;  Location: [facility];  Service: Orthopedics;  Laterality: Right;   CARDIOVERSION N/A 05/15/2019   Procedure: CARDIOVERSION;  Surgeon: Antonieta Iba, MD;  Location: [facility];  Service: Cardiovascular;  Laterality: N/A;   CARDIOVERSION N/A 03/13/2014   Procedure: CARDIOVERSION; Location: [facility]; Surgeon: [name], MD   CARDIOVERSION N/A 03/24/2022   Procedure: CARDIOVERSION;  Surgeon: Antonieta Iba, MD;  Location: [facility];  Service: Cardiovascular;  Laterality: N/A;  CARDIOVERSION N/A 05/19/2022   Procedure: CARDIOVERSION;  Surgeon: Antonieta Iba, MD;  Location: [facility];  Service: Cardiovascular;  Laterality: N/A;   CARDIOVERSION N/A 07/25/2022   Procedure: CARDIOVERSION;  Surgeon: Duke Salvia, MD;  Location: [facility];  Service: Cardiovascular;  Laterality: N/A;   CARDIOVERSION N/A 09/21/2022   Procedure: CARDIOVERSION;  Surgeon: Duke Salvia, MD;  Location: [facility];  Service: Cardiovascular;  Laterality: N/A;   CARPAL TUNNEL RELEASE Right 12/23/2014   Procedure: CARPAL TUNNEL RELEASE;  Surgeon: Deeann Saint, MD;  Location: [facility];  Service:  Orthopedics;  Laterality: Right;   CARPAL TUNNEL RELEASE Left 01/06/2015   Procedure: CARPAL TUNNEL RELEASE;  Surgeon: Deeann Saint, MD;  Location: [facility];  Service: Orthopedics;  Laterality: Left;   COLONOSCOPY WITH PROPOFOL N/A 10/08/2017   Procedure: COLONOSCOPY WITH PROPOFOL;  Surgeon: Toney Reil, MD;  Location[facility];  Service: Gastroenterology;  Laterality: N/A;   COLONOSCOPY WITH PROPOFOL N/A 06/07/2022   Procedure: COLONOSCOPY WITH PROPOFOL;  Surgeon: Toney Reil, MD;  Location: [facility];  Service: Gastroenterology;  Laterality: N/A;   ENTEROSCOPY  06/07/2022   Procedure: ENTEROSCOPY;  Surgeon: Toney Reil, MD;  Location: [facility]COPY;  Service: Gastroenterology;;   ESOPHAGOGASTRODUODENOSCOPY (EGD) WITH PROPOFOL N/A 06/07/2022   Procedure: ESOPHAGOGASTRODUODENOSCOPY (EGD) WITH PROPOFOL;  Surgeon: Toney Reil, MD;  Location: [facility] ENDOSCOPY;  Service: Gastroenterology;  Laterality: N/A;   GASTRIC BYPASS  01/17/2000   KNEE SURGERY Right    knee trauma x3   prostate seeding     REVERSE SHOULDER ARTHROPLASTY Right 09/27/2021   Procedure: Right reverse shoulder arthroplasty, biceps tenodesis;  Surgeon: Signa Kell, MD;  Location: [facility];  Service: Orthopedics;  Laterality: Right;   REVERSE SHOULDER ARTHROPLASTY Left 02/20/2023   Procedure: Left reverse shoulder arthroplasty, biceps tenodesis;  Surgeon: Signa Kell, MD;  Location: [facility];  Service: Orthopedics;  Laterality: Left;   SHOULDER ARTHROSCOPY WITH ROTATOR CUFF REPAIR AND OPEN BICEPS TENODESIS Left 12/27/2021   Procedure: Left shoulder arthroscopic cuff repair (supraspinatus and subscapularis) with Regeneten Patch application;  Surgeon: Signa Kell, MD;  Location: [facility];  Service: Orthopedics;  Laterality: Left;   Patient Active Problem List   Diagnosis Date Noted   Symptomatic anemia 04/26/2022   Chronic pain of both knees 03/30/2022   Bilateral primary osteoarthritis  of knee 03/30/2022   Shortness of breath 03/24/2022   Vitamin D deficiency 02/24/2022   S/p reverse total shoulder arthroplasty 09/27/2021   Lesion of bone of lumbosacral spine (L5) 05/12/2021   Localized osteoarthritis of shoulder regions, bilateral 03/29/2021   Chronic pain of both shoulders 03/29/2021   Drug-induced myopathy 02/21/2021   Trigger finger of right hand 11/15/2020   Prostate cancer (HCC) 07/26/2020   Insomnia 08/01/2019   Primary osteoarthritis of both wrists 02/17/2019   Trigger middle finger of left hand 02/17/2019   Chronic pain syndrome 02/17/2019   Chronic radicular lumbar pain 10/08/2018   Lumbar radiculopathy 10/08/2018   Lumbar degenerative disc disease 10/08/2018   Lumbar facet arthropathy 10/08/2018   Lumbar facet joint syndrome 10/08/2018   Intervertebral disc disorder with radiculopathy of lumbosacral region    Osteoarthritis of knee 11/30/2017   Osteoarthritis of wrist 11/30/2017   Pernicious anemia 05/22/2016   Persistent atrial fibrillation (HCC) 12/20/2015   Carpal tunnel syndrome 11/16/2014   DJD (degenerative joint disease) of knee 10/27/2014   Bilateral carpal tunnel syndrome 10/27/2014   Morbid obesity with BMI of 45.0-49.9, adult (HCC) 10/27/2014   Mixed hyperlipidemia  10/27/2014   H/O gastric bypass 03/20/2014   Hyperkalemia 03/20/2014   History of prostate cancer 12/26/2013   Essential hypertension 12/26/2013   Encounter for anticoagulation discussion and counseling 12/26/2013    PCP: Dr. Althea Charon  REFERRING PROVIDER: Dr. Signa Kell  REFERRING DIAG: [phone] (ICD-10-CM) - Complete rotator cuff tear or rupture of left shoulder, not specified as traumatic   RATIONALE FOR EVALUATION AND TREATMENT: Rehabilitation  THERAPY DIAG: Chronic left shoulder pain  Muscle weakness (generalized)  ONSET DATE: 02/20/23  FOLLOW-UP APPT SCHEDULED WITH REFERRING PROVIDER: Yes   FROM INITIAL EVALUATION SUBJECTIVE:                                                                                                                                                                                          SUBJECTIVE STATEMENT:  L reverse TSR 02/20/23  PERTINENT HISTORY:  GEVIN PEREA is a 68 y.o. male who initially underwent arthroscopic subscapularis repair and side-to-side supraspinatus repair with Regeneten patch application by Dr. Allena Katz 12/27/2021. He underwent post-surgical physical therapy with initial minimal improvement followed by worsening shoulder strength/function. Repeat MRI showed large, retracted subscapularis re-tear and supraspinatus re-tear with signs of rotator cuff arthropathy. Conservative measures including medications, physical therapy, and cortisone injections did not provide adequate relief. The patient elected to proceed with reverse shoulder arthroplasty and biceps tenodesis on 02/20/23. Intraoperative findings include retracted full-thickness subscapularis tear, partial-thickness supraspinatus tear, and biceps tendinopathy. Surgery was a standard deltopectoral incision under general anesthesia along with an interscalene block. Per op note, operative arm to remain in sling at all times except ROM exercises and hygiene. Can perform pendulums, elbow/wrist/hand ROM exercises. Passive ROM allowed to 90 FF and 30 ER. ASA 325mg  x 6 weeks for DVT ppx (pt states that he was actually instructed to start back on his Xarelto and not to use ASA). Plan for PT starting on POD #3-4. Pt denies any post-operative complications from surgery. Pt has a history of chronic back pain and has undergone multiple lumbar facet, medial branch radiofrequency ablations. He also has a past history of cardiomyopathy (in setting of Afib), DJD (degenerative joint disease) of knee, kidney stones, lower extremity edema, PAF (paroxysmal atrial fibrillation), pernicious anemia, prostate cancer, and a pulmonary nodule. He also has a past surgical history that includes R reverse  TSR, knee surgery, gastric bypass (2002), prostate seeding, carpal tunnel release (Right, 12/23/2014; Left, 01/06/2015), and multiple cardioversions.   PAIN:  Pain Intensity: No resting pain upon arrival Pain location: L shoulder Pain Quality: "Nerve pain" Radiating: No  Numbness/Tingling: No Focal Weakness: Yes 24-hour pain behavior: Varies History of prior shoulder or neck/shoulder injury,  pain, surgery, or therapy: Yes PT for L shoulder s/p RTC repair, history of R reverse TSR; Dominant hand: right Red flags Positive: history of prostate CA, Negative: chills/fever, night sweats, nausea, vomiting, unrelenting pain, unexplained weight gain/loss  PRECAUTIONS: Shoulder, per operative note: operative arm to remain in sling at all times except ROM exercises and hygiene. Can perform pendulums, elbow/wrist/hand ROM exercises. Passive ROM allowed to 90 FF and 30 ER.   General reverse TSR precautions include no combined shoulder extension, IR, and adduction. No shoulder and elbow AROM (bicep tenodesis) for at least the first 4 weeks   WEIGHT BEARING RESTRICTIONS: Yes no WB through LUE  FALLS: Has patient fallen in last 6 months? 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  Recent memory is intact.  Remote memory is intact.  Attention span and  concentration are intact.  Expressive speech is intact.  Patient's fund of knowledge is within normal limits for educational level.    Gross Musculoskeletal Assessment Tremor: None Bulk: Normal Tone: Normal Incision is clean and dry with minimal blood staining on honeycomb dressing as well as minimal swelling around the incision. Skin is cool to touch and no signs of infection present.   Gait Deferred full gait assessment  Posture Forward head  with upper thoracic kyphosis and rounded shoulders  Cervical Screen Deferred  ROM ROM (Normal range in degrees)    Right (AROM) Left (PROM)  Shoulder    Flexion 160 90 (limited per protocol)  Extension    Abduction 180 90 (scaption)  External Rotation 88 0  Internal Rotation 70 stomach  Hands Behind Head    Hands Behind Back        Elbow    Flexion Full Full  Extension 0 0  Pronation WNL Full palm up  Supination WNL Full palm down  (* = pain; Blank rows = not tested)  UE MMT: MMT (out of 5) Right Left   Shoulder   Flexion 5   Extension    Abduction 5   External rotation 5   Internal rotation 5   Horizontal abduction    Horizontal adduction    Lower Trapezius    Rhomboids        Elbow  Flexion 5   Extension 5   Pronation 5   Supination 5       Wrist  Flexion 5 5  Extension 5 5  Radial deviation  5  Ulnar deviation  5      MCP  Flexion 5 5  Extension 5 5  Abduction 5 5  Adduction 5 5  (* = pain; Blank rows = not tested)  Sensation Deferred  Reflexes Deferred  Palpation No tenderness to light palpation around entire L shoulder girdle;  Passive Accessory Intervertebral Motion Deferred  Accessory Motions/Glides Deferred  Muscle Length Testing Deferred  SPECIAL TESTS Deferred  Beighton scale Deferred   TODAY'S TREATMENT  Sensation testing, progress L shoulder  per surgical protocol;  SUBJECTIVE: Pt reports that he is doing well today. No changes since the initial evaluation. Denies  resting pain upon arrival. No specific questions or concerns.    PAIN:   Manual Therapy  Sensation testing, progress L shoulder per surgical protocol; L shoulder PROM into flexion and scaption stopping at 90 degrees FF; L shoulder PROM ER stopping at 0 secondary to pain; L wrist AROM flexion and extension; HEP issued and reviewed with patient;   Ther-ex        PATIENT EDUCATION:  Education details: Plan of care and HEP Person educated: Patient Education method: Explanation, Actor cues, Verbal cues, and Handouts Education comprehension: verbalized understanding and returned demonstration   HOME EXERCISE PROGRAM:  Access Code: [email or web address] URL: [email or web address] Date: 02/28/2023 Prepared by: Ria Comment  Exercises - Seated Cervical Sidebending Stretch (Mirrored)  - 2 x daily - 7 x weekly - 3 reps - 30s hold - Seated Scapular Retraction  - 2 x daily - 7 x weekly - 2 sets - 10 reps - 3s hold - Circular Shoulder Pendulum with Table Support  - 2 x daily - 7 x weekly - 3 reps - 60s hold - Horizontal Shoulder Pendulum with Table Support  - 2 x daily - 7 x weekly - 3 reps - 60s hold - Flexion-Extension Shoulder Pendulum with Table Support (Mirrored)  - 2 x daily - 7 x weekly - 3 reps - 60s hold - Wrist Flexion AROM  - 2 x daily - 7 x weekly - 2 sets - 10 reps - 3s hold - Wrist Extension AROM  - 1 x daily - 7 x weekly - 2 sets - 10 reps - 3s hold    ASSESSMENT:  CLINICAL IMPRESSION: Progressed L shoulder PROM during session today with patient. Therapy sessions are currently limited by post-operative restrictions. Reviewed HEP with patient. Pt encouraged to follow-up as scheduled. Pt will benefit from PT services to address deficits in strength, balance, and mobility in order to return to full function at home and decrease his risk for falls.    Patient is a 68 y.o. male who was seen today for physical therapy evaluation and treatment s/p L shoulder reverse  TSR.   OBJECTIVE IMPAIRMENTS: decreased ROM, decreased strength, and pain.   ACTIVITY LIMITATIONS: carrying, lifting, bathing, toileting, dressing, and reach over head  PARTICIPATION LIMITATIONS: meal prep, cleaning, laundry, driving, shopping, community activity, and yard work  PERSONAL FACTORS: Past/current experiences, Time since onset of injury/illness/exacerbation, and 3+ comorbidities: OA, anemia, anxiety, cardiomyopathy, chronic pain  are also affecting patient's functional outcome.   REHAB POTENTIAL: Good  CLINICAL DECISION MAKING: Unstable/unpredictable  EVALUATION COMPLEXITY: High   GOALS: Goals reviewed with patient? Yes  SHORT TERM GOALS: Target date: 04/09/2023  Pt will be independent with HEP to improve strength and decrease shoulder pain to improve pain-free function at home and work. Baseline:  Goal status: INITIAL   LONG TERM GOALS: Target date: 05/21/2023  1.  Pt will report no further L shoulder pain with all AROM in order to complete all ADLs and improve pain-free function. Baseline:  Goal status: INITIAL  2.  Pt will decrease QuickDASH score by at least 8% in order to demonstrate clinically significant reduction in disability related to shoulder pain        Baseline: 02/26/23: 72.7% Goal status: INITIAL  3. Pt will increase strength of L shoulder flexion and scaption to at least 4/5 in order to demonstrate improvement in strength and function  Baseline: 02/26/23: Unable to test Goal status: INITIAL  4. Pt will improve L shoulder AROM flexion and scaption to at least 110 degrees in order to demonstrate improvement in function so he can take care of his property and complete all household responsibilities;        Baseline: 02/26/23: Unable to test (90 degrees PROM for both) Goal status: INITIAL   PLAN: PT FREQUENCY: 1-2x/week  PT DURATION: 12 weeks  PLANNED INTERVENTIONS: Therapeutic exercises, Therapeutic activity, Neuromuscular re-education,  Balance training, Gait training, Patient/Family education, Self Care, Joint mobilization, Joint manipulation, Vestibular training, Canalith repositioning, Orthotic/Fit training, DME instructions, Dry Needling, Electrical stimulation, Spinal manipulation, Spinal mobilization, Cryotherapy, Moist heat, Taping, Traction, Ultrasound, Ionotophoresis 4mg /ml Dexamethasone, Manual therapy, and Re-evaluation.  PLAN FOR NEXT SESSION: Sensation testing, progress L shoulder per surgical protocol;   Sharalyn Ink Aella Ronda PT, DPT, GCS  Shantay Sonn, PT 03/26/2023, 1:44 PM

## 2023-03-28 ENCOUNTER — Ambulatory Visit
Payer: Medicare Other | Attending: Student in an Organized Health Care Education/Training Program | Admitting: Student in an Organized Health Care Education/Training Program

## 2023-03-28 ENCOUNTER — Ambulatory Visit: Payer: Medicare Other | Attending: Orthopedic Surgery

## 2023-03-28 ENCOUNTER — Encounter: Payer: Self-pay | Admitting: Student in an Organized Health Care Education/Training Program

## 2023-03-28 VITALS — BP 132/85 | HR 90 | Temp 97.1°F | Ht 72.0 in | Wt 347.0 lb

## 2023-03-28 DIAGNOSIS — G8929 Other chronic pain: Secondary | ICD-10-CM | POA: Insufficient documentation

## 2023-03-28 DIAGNOSIS — G894 Chronic pain syndrome: Secondary | ICD-10-CM | POA: Insufficient documentation

## 2023-03-28 DIAGNOSIS — R293 Abnormal posture: Secondary | ICD-10-CM | POA: Insufficient documentation

## 2023-03-28 DIAGNOSIS — M65331 Trigger finger, right middle finger: Secondary | ICD-10-CM | POA: Diagnosis not present

## 2023-03-28 DIAGNOSIS — M19031 Primary osteoarthritis, right wrist: Secondary | ICD-10-CM | POA: Diagnosis not present

## 2023-03-28 DIAGNOSIS — M170 Bilateral primary osteoarthritis of knee: Secondary | ICD-10-CM | POA: Diagnosis not present

## 2023-03-28 DIAGNOSIS — M19032 Primary osteoarthritis, left wrist: Secondary | ICD-10-CM | POA: Insufficient documentation

## 2023-03-28 DIAGNOSIS — M25512 Pain in left shoulder: Secondary | ICD-10-CM | POA: Insufficient documentation

## 2023-03-28 DIAGNOSIS — M6281 Muscle weakness (generalized): Secondary | ICD-10-CM | POA: Insufficient documentation

## 2023-03-28 MED ORDER — SODIUM HYALURONATE (VISCOSUP) 16.8 MG/2ML IX SOSY
16.8000 mg | PREFILLED_SYRINGE | Freq: Once | INTRA_ARTICULAR | Status: AC
  Administered 2023-03-28: 16.8 mg via INTRA_ARTICULAR

## 2023-03-28 MED ORDER — LIDOCAINE HCL (PF) 2 % IJ SOLN
INTRAMUSCULAR | Status: AC
  Filled 2023-03-28: qty 10

## 2023-03-28 MED ORDER — ROPIVACAINE HCL 2 MG/ML IJ SOLN
INTRAMUSCULAR | Status: AC
  Filled 2023-03-28: qty 20

## 2023-03-28 MED ORDER — LIDOCAINE HCL 2 % IJ SOLN
20.0000 mL | Freq: Once | INTRAMUSCULAR | Status: AC
  Administered 2023-03-28: 200 mg

## 2023-03-28 MED ORDER — DEXAMETHASONE SODIUM PHOSPHATE 10 MG/ML IJ SOLN
INTRAMUSCULAR | Status: AC
  Filled 2023-03-28: qty 1

## 2023-03-28 MED ORDER — DEXAMETHASONE SODIUM PHOSPHATE 10 MG/ML IJ SOLN
10.0000 mg | Freq: Once | INTRAMUSCULAR | Status: AC
  Administered 2023-03-28: 10 mg

## 2023-03-28 NOTE — Progress Notes (Signed)
 Safety precautions to be maintained throughout the outpatient stay will include: orient to surroundings, keep bed in low position, maintain call bell within reach at all times, provide assistance with transfer out of bed and ambulation.

## 2023-03-28 NOTE — Progress Notes (Signed)
 PROVIDER NOTE: Interpretation of information contained herein should be left to medically-trained personnel. Specific patient instructions are provided elsewhere under "Patient Instructions" section of medical record. This document was created in part using STT-dictation technology, any transcriptional errors that may result from this process are unintentional.  Patient: [name] Type: Established DOB: 02/24/1955 MRN: [id number] PCP: Smitty Cords, DO  Service: Procedure DOS: 03/28/2023 Setting: Ambulatory Location: Ambulatory outpatient facility Delivery: Face-to-face Provider: Edward Jolly, MD Specialty: Interventional Pain Management Specialty designation: 09 Location: Outpatient facility Ref. Prov.: Edward Jolly, MD       Interventional Therapy   Type:  Gelsyn-3 Intra-articular Knee Injection          Laterality: Bilateral (-50) Level/approach: Medial Imaging guidance: None required ([id number]) Anesthesia: Local anesthesia (1-2% Lidocaine) DOS: 03/28/2023  Performed by: Edward Jolly, MD  Purpose: Diagnostic/Therapeutic Indications: Knee arthralgia associated to osteoarthritis of the knee 1. Bilateral primary osteoarthritis of knee   2. Chronic pain syndrome   3. Trigger middle finger of left hand    NAS-11 score:   Pre-procedure: 4 /10   Post-procedure: 0-No pain (knees and fingers are numb)/10     Pre-Procedure Preparation  Monitoring: As per clinic protocol.  Risk Assessment: Vitals:  [id number] body mass index is 47.06 kg/m as calculated from the following:   Height as of this encounter: 6' (1.829 m).   Weight as of this encounter: 347 lb (157.4 kg)., Rate:90 , BP:132/85, Resp: , Temp:(!) 97.1 F (36.2 C), SpO2:97 %  Allergies: He is allergic to bupivacaine liposome.  Precautions: No additional precautions required  Blood-thinner(s): None at this time  Coagulopathies: Reviewed. None identified.   Active Infection(s): Reviewed. None identified. Mr.  Hensley is afebrile   Location setting: Exam room Position: Sitting w/ knee bent 90 degrees Safety Precautions: Patient was assessed for positional comfort and pressure points before starting the procedure. Prepping solution: DuraPrep (Iodine Povacrylex [0.7% available iodine] and Isopropyl Alcohol, 74% w/w) Prep Area: Entire knee region Approach: percutaneous, just above the tibial plateau, lateral to the infrapatellar tendon. Intended target: Intra-articular knee space Materials: Tray: Block Needle(s): Regular Qty: 1/side Length: 1.5-inch Gauge: 25G (x1) + 22G (x1)  Meds ordered this encounter  Medications   lidocaine (XYLOCAINE) 2 % (with pres) injection 400 mg   sodium hyaluronate (viscosup) (GELSYN-3) intra-articular injection 16.8 mg    Do not substitute. Deliver to facility day before procedure.   sodium hyaluronate (viscosup) (GELSYN-3) intra-articular injection 16.8 mg    Do not substitute. Deliver to facility day before procedure.   dexamethasone (DECADRON) injection 10 mg    No orders of the defined types were placed in this encounter.    Time-out: 1128 I initiated and conducted the "Time-out" before starting the procedure, as per protocol. The patient was asked to participate by confirming the accuracy of the "Time Out" information. Verification of the correct person, site, and procedure were performed and confirmed by me, the nursing staff, and the patient. "Time-out" conducted as per Joint Commission's Universal Protocol (UP.01.01.01). Procedure checklist: Completed  H&P (Pre-op  Assessment)  [name] is a 68 y.o. (year old), male patient, seen today for interventional treatment. He  has a past surgical history that includes Knee surgery (Right); Gastric bypass (01/17/2000); prostate seeding; Carpal tunnel release (Right, 12/23/2014); Carpal tunnel release (Left, 01/06/2015); Colonoscopy with propofol (N/A, 10/08/2017); Cardioversion (N/A, 05/15/2019); Cardioversion  (N/A, 03/13/2014); Reverse shoulder arthroplasty (Right, 09/27/2021); Bicept tenodesis (Right, 09/27/2021); Shoulder arthroscopy with rotator cuff repair and open biceps tenodesis (Left,  12/27/2021); Cardioversion (N/A, 03/24/2022); Cardioversion (N/A, 05/19/2022); Colonoscopy with propofol (N/A, 06/07/2022); Esophagogastroduodenoscopy (egd) with propofol (N/A, 06/07/2022); enteroscopy (06/07/2022); Cardioversion (N/A, 07/25/2022); Cardioversion (N/A, 09/21/2022); Atrial fibrillation ablation (N/A, 11/17/2022); and Reverse shoulder arthroplasty (Left, 02/20/2023). Mr. Voland has a current medication list which includes the following prescription(s): acetaminophen, amiodarone, amlodipine, benazepril, buspirone, carboxymethylcellulose, cyanocobalamin, cyclobenzaprine, duloxetine, ferrous bisglycinate chelate, fluticasone, hydrocodone-acetaminophen, [START ON 04/18/2023] hydrocodone-acetaminophen, melatonin, multivitamin with minerals, ondansetron, oxycodone, xarelto, trazodone, and zepbound. His primarily concern today is the Knee Pain (both)  He is allergic to bupivacaine liposome.   Last encounter: My last encounter with him was on 02/13/2023. Pertinent problems: [name] has H/O gastric bypass; DJD (degenerative joint disease) of knee; Bilateral carpal tunnel syndrome; Persistent atrial fibrillation (HCC); Chronic radicular lumbar pain; Lumbar radiculopathy; Lumbar degenerative disc disease; Lumbar facet arthropathy; Lumbar facet joint syndrome; Primary osteoarthritis of both wrists; Trigger middle finger of left hand; and Chronic pain syndrome on their pertinent problem list. Pain Assessment: Severity of Chronic pain is reported as a 4 /10. Location: Knee Left, Right/Denies. Onset: More than a month ago. Quality: Aching, Burning, Constant. Timing: Constant. Modifying factor(s): sitting down, laying down. Vitals:  height is 6' (1.829 m) and weight is 347 lb (157.4 kg) (abnormal). His temperature is 97.1 F (36.2  C) (abnormal). His blood pressure is 132/85 and his pulse is 90. His oxygen saturation is 97%.   Reason for encounter: "interventional pain management therapy due pain of at least four (4) weeks in duration, with failure to respond and/or inability to tolerate more conservative care.  Site Confirmation: Mr. Spadaccini was asked to confirm the procedure and laterality before marking the site.  Consent: Before the procedure and under the influence of no sedative(s), amnesic(s), or anxiolytics, the patient was informed of the treatment options, risks and possible complications. To fulfill our ethical and legal obligations, as recommended by the American Medical Association's Code of Ethics, I have informed the patient of my clinical impression; the nature and purpose of the treatment or procedure; the risks, benefits, and possible complications of the intervention; the alternatives, including doing nothing; the risk(s) and benefit(s) of the alternative treatment(s) or procedure(s); and the risk(s) and benefit(s) of doing nothing. The patient was provided information about the general risks and possible complications associated with the procedure. These may include, but are not limited to: failure to achieve desired goals, infection, bleeding, organ or nerve damage, allergic reactions, paralysis, and death. In addition, the patient was informed of those risks and complications associated to Spine-related procedures, such as failure to decrease pain; infection (i.e.: Meningitis, epidural or intraspinal abscess); bleeding (i.e.: epidural hematoma, subarachnoid hemorrhage, or any other type of intraspinal or peri-dural bleeding); organ or nerve damage (i.e.: Any type of peripheral nerve, nerve root, or spinal cord injury) with subsequent damage to sensory, motor, and/or autonomic systems, resulting in permanent pain, numbness, and/or weakness of one or several areas of the body; allergic reactions; (i.e.: anaphylactic  reaction); and/or death. Furthermore, the patient was informed of those risks and complications associated with the medications. These include, but are not limited to: allergic reactions (i.e.: anaphylactic or anaphylactoid reaction(s)); adrenal axis suppression; blood sugar elevation that in diabetics may result in ketoacidosis or comma; water retention that in patients with history of congestive heart failure may result in shortness of breath, pulmonary edema, and decompensation with resultant heart failure; weight gain; swelling or edema; medication-induced neural toxicity; particulate matter embolism and blood vessel occlusion with resultant organ, and/or nervous system infarction; and/or  aseptic necrosis of one or more joints. Finally, the patient was informed that Medicine is not an exact science; therefore, there is also the possibility of unforeseen or unpredictable risks and/or possible complications that may result in a catastrophic outcome. The patient indicated having understood very clearly. We have given the patient no guarantees and we have made no promises. Enough time was given to the patient to ask questions, all of which were answered to the patient's satisfaction. Mr. Mcnellis has indicated that he wanted to continue with the procedure. Attestation: I, the ordering provider, attest that I have discussed with the patient the benefits, risks, side-effects, alternatives, likelihood of achieving goals, and potential problems during recovery for the procedure that I have provided informed consent.  Date  Time: 03/28/2023 10:51 AM  Description of procedure  Start Time: 1128 hrs  Local Anesthesia: Once the patient was positioned, prepped, and time-out was completed. The target area was identified located. The skin was marked with an approved surgical skin marker. Once marked, the skin (epidermis, dermis, and hypodermis), and deeper tissues (fat, connective tissue and muscle) were infiltrated  with a small amount of a short-acting local anesthetic, loaded on a 10cc syringe with a 25G, 1.5-in  Needle. An appropriate amount of time was allowed for local anesthetics to take effect before proceeding to the next step. Local Anesthetic: Lidocaine 1-2% The unused portion of the local anesthetic was discarded in the proper designated containers. Safety Precautions: Aspiration looking for blood return was conducted prior to all injections. At no point did I inject any substances, as a needle was being advanced. Before injecting, the patient was told to immediately notify me if he was experiencing any new onset of "ringing in the ears, or metallic taste in the mouth". No attempts were made at seeking any paresthesias. Safe injection practices and needle disposal techniques used. Medications properly checked for expiration dates. SDV (single dose vial) medications used. After the completion of the procedure, all disposable equipment used was discarded in the proper designated medical waste containers.  Technical description: Protocol guidelines were followed. After positioning, the target area was identified and prepped in the usual manner. Skin & deeper tissues infiltrated with local anesthetic. Appropriate amount of time allowed to pass for local anesthetics to take effect. Proper needle placement secured. Once satisfactory needle placement was confirmed, I proceeded to inject the desired solution in slow, incremental fashion, intermittently assessing for discomfort or any signs of abnormal or undesired spread of substance. Once completed, the needle was removed and disposed of, as per hospital protocols. The area was cleaned, making sure to leave some of the prepping solution back to take advantage of its long term bactericidal properties.  Aspiration:  Negative        Vitals:   03/28/23 1056  BP: 132/85  Pulse: 90  Temp: (!) 97.1 F (36.2 C)  SpO2: 97%  Weight: (!) 347 lb (157.4 kg)   Height: 6' (1.829 m)    End Time: 1140 hrs  Post-op assessment  Post-procedure Vital Signs:  Pulse/HCG Rate: 90  Temp: (!) 97.1 F (36.2 C) Resp:   BP: 132/85 SpO2: 97 %  EBL: None  Complications: No immediate post-treatment complications observed by team, or reported by patient.  Note: The patient tolerated the entire procedure well. A repeat set of vitals were taken after the procedure and the patient was kept under observation following institutional policy, for this type of procedure. Post-procedural neurological assessment was performed, showing return to baseline, prior  to discharge. The patient was provided with post-procedure discharge instructions, including a section on how to identify potential problems. Should any problems arise concerning this procedure, the patient was given instructions to immediately contact us, at any time, without hesitation. In any case, we plan to contact the patient by telephone for a follow-up status report regarding this interventional procedure.  Comments:  No additional relevant information.  Plan of care  Chronic Opioid Analgesic:  hydrocodone 7.5 mg 3 times daily as needed     Medications administered: We administered lidocaine, sodium hyaluronate (viscosup), sodium hyaluronate (viscosup), and dexamethasone.  Follow-up plan:   Return for Keep sch. appt.     Recent Visits Date Type Provider Dept  02/13/23 Office Visit Edward Jolly, MD [facility]  01/08/23 Procedure visit Edward Jolly, MD [facility]  Showing recent visits within past 90 days and meeting all other requirements Today's Visits Date Type Provider Dept  03/28/23 Procedure visit Edward Jolly, MD [facility]  Showing today's visits and meeting all other requirements Future Appointments Date Type Provider Dept  05/09/23 Appointment Edward Jolly, MD [facility]  Showing future appointments within next 90 days and meeting all  other requirements   Disposition: Discharge home  Discharge (Date  Time): 03/28/2023; 1141 hrs.   Primary Care Physician: Smitty Cords, DO Location: [facility] Note by: Edward Jolly, MD Date: 03/28/2023; Time: 11:53 AM  DISCLAIMER: Medicine is not an exact science. It has no guarantees or warranties. The decision to proceed with this intervention was based on the information collected from the patient. Conclusions were drawn from the patient's questionnaire, interview, and examination. Because information was provided in large part by the patient, it cannot be guaranteed that it has not been purposely or unconsciously manipulated or altered. Every effort has been made to obtain as much accurate, relevant, available data as possible. Always take into account that the treatment will also be dependent on availability of resources and existing treatment guidelines, considered by other Pain Management Specialists as being common knowledge and practice, at the time of the intervention. It is also important to point out that variation in procedural techniques and pharmacological choices are the acceptable norm. For Medico-Legal review purposes, the indications, contraindications, technique, and results of the these procedures should only be evaluated, judged and interpreted by a Board-Certified Interventional Pain Specialist with extensive familiarity and expertise in the same exact procedure and technique.

## 2023-03-28 NOTE — Patient Instructions (Addendum)
 ____________stop blood thinner x 3 days prior to  next procedure__________________________________________________________    Post-Procedure Discharge Instructions  Instructions: Apply ice:  Purpose: This will minimize any swelling and discomfort after procedure.  When: Day of procedure, as soon as you get home. How: Fill a plastic sandwich bag with crushed ice. Cover it with a small towel and apply to injection site. How long: (15 min on, 15 min off) Apply for 15 minutes then remove x 15 minutes.  Repeat sequence on day of procedure, until you go to bed. Apply heat:  Purpose: To treat any soreness and discomfort from the procedure. When: Starting the next day after the procedure. How: Apply heat to procedure site starting the day following the procedure. How long: May continue to repeat daily, until discomfort goes away. Food intake: Start with clear liquids (like water) and advance to regular food, as tolerated.  Physical activities: Keep activities to a minimum for the first 8 hours after the procedure. After that, then as tolerated. Driving: If you have received any sedation, be responsible and do not drive. You are not allowed to drive for 24 hours after having sedation. Blood thinner: (Applies only to those taking blood thinners) You may restart your blood thinner 6 hours after your procedure. Insulin: (Applies only to Diabetic patients taking insulin) As soon as you can eat, you may resume your normal dosing schedule. Infection prevention: Keep procedure site clean and dry. Shower daily and clean area with soap and water. Post-procedure Pain Diary: Extremely important that this be done correctly and accurately. Recorded information will be used to determine the next step in treatment. For the purpose of accuracy, follow these rules: Evaluate only the area treated. Do not report or include pai/n from an untreated area. For the purpose of this evaluation, ignore all other areas of pain,  except for the treated area. After your procedure, avoid taking a long nap and attempting to complete the pain diary after you wake up. Instead, set your alarm clock to go off every hour, on the hour, for the initial 8 hours after the procedure. Document the duration of the numbing medicine, and the relief you are getting from it. Do not go to sleep and attempt to complete it later. It will not be accurate. If you received sedation, it is likely that you were given a medication that may cause amnesia. Because of this, completing the diary at a later time may cause the information to be inaccurate. This information is needed to plan your care. Follow-up appointment: Keep your post-procedure follow-up evaluation appointment after the procedure (usually 2 weeks for most procedures, 6 weeks for radiofrequencies). DO NOT FORGET to bring you pain diary with you.   Expect: (What should I expect to see with my procedure?) From numbing medicine (AKA: Local Anesthetics): Numbness or decrease in pain. You may also experience some weakness, which if present, could last for the duration of the local anesthetic. Onset: Full effect within 15 minutes of injected. Duration: It will depend on the type of local anesthetic used. On the average, 1 to 8 hours.  From steroids (Applies only if steroids were used): Decrease in swelling or inflammation. Once inflammation is improved, relief of the pain will follow. Onset of benefits: Depends on the amount of swelling present. The more swelling, the longer it will take for the benefits to be seen. In some cases, up to 10 days. Duration: Steroids will stay in the system x 2 weeks. Duration of benefits will  depend on multiple posibilities including persistent irritating factors. Side-effects: If present, they may typically last 2 weeks (the duration of the steroids). Frequent: Cramps (if they occur, drink Gatorade and take over-the-counter Magnesium 450-500 mg once to twice a day);  water retention with temporary weight gain; increases in blood sugar; decreased immune system response; increased appetite. Occasional: Facial flushing (red, warm cheeks); mood swings; menstrual changes. Uncommon: Long-term decrease or suppression of natural hormones; bone thinning. (These are more common with higher doses or more frequent use. This is why we prefer that our patients avoid having any injection therapies in other practices.)  Very Rare: Severe mood changes; psychosis; aseptic necrosis. From procedure: Some discomfort is to be expected once the numbing medicine wears off. This should be minimal if ice and heat are applied as instructed.  Call if: (When should I call?) You experience numbness and weakness that gets worse with time, as opposed to wearing off. New onset bowel or bladder incontinence. (Applies only to procedures done in the spine)  Emergency Numbers: Durning business hours (Monday - Thursday, 8:00 AM - 4:00 PM) (Friday, 9:00 AM - 12:00 Noon): [phone] After hours: [phone] NOTE: If you are having a problem and are unable connect with, or to talk to a provider, then go to your nearest urgent care or emergency department. If the problem is serious and urgent, please call 911. ______________________________________________________________________

## 2023-03-28 NOTE — Progress Notes (Signed)
 PROVIDER NOTE: Interpretation of information contained herein should be left to medically-trained personnel. Specific patient instructions are provided elsewhere under "Patient Instructions" section of medical record. This document was created in part using STT-dictation technology, any transcriptional errors that may result from this process are unintentional.  Patient: [name] Type: Established DOB: 07-12-1955 MRN: [id number] PCP: Smitty Cords, DO  Service: Procedure DOS: 03/28/2023 Setting: Ambulatory Location: Ambulatory outpatient facility Delivery: Face-to-face Provider: Edward Jolly, MD Specialty: Interventional Pain Management Specialty designation: 09 Location: Outpatient facility Ref. Prov.: Edward Jolly, MD       Interventional Therapy   Primary Reason for Visit: Interventional Pain Management Treatment. CC: Knee Pain (both)    Procedure:          Anesthesia, Analgesia, Anxiolysis:  Type: Trigger Finger Ligament/Tendon sheath (20550) Injection.           Purpose: Therapeutic Target Area: Flexor Digitorum Tendon sheath nodule Region: A-2 and A-4 pulley of the metacarpal area Approach: Percutaneous Digit: No:3(Middle) Finger Laterality: Right-hand  Type: Local Anesthesia Local Anesthetic: Lidocaine 1-2% Sedation: None  Indication(s):  Analgesia Route: Infiltration ([place]/IM) IV Access: N/A   Position: Sitting   Trigger finger of right hand   NAS-11 Pain score:   Pre-procedure: 4 /10   Post-procedure: 0-No pain (knees and fingers are numb)/10     Pre-op H&P Assessment:  [name] is a 67 y.o. (year old), male patient, seen today for interventional treatment. He  has a past surgical history that includes Knee surgery (Right); Gastric bypass (01/17/2000); prostate seeding; Carpal tunnel release (Right, 12/23/2014); Carpal tunnel release (Left, 01/06/2015); Colonoscopy with propofol (N/A, 10/08/2017); Cardioversion (N/A, 05/15/2019); Cardioversion  (N/A, 03/13/2014); Reverse shoulder arthroplasty (Right, 09/27/2021); Bicept tenodesis (Right, 09/27/2021); Shoulder arthroscopy with rotator cuff repair and open biceps tenodesis (Left, 12/27/2021); Cardioversion (N/A, 03/24/2022); Cardioversion (N/A, 05/19/2022); Colonoscopy with propofol (N/A, 06/07/2022); Esophagogastroduodenoscopy (egd) with propofol (N/A, 06/07/2022); enteroscopy (06/07/2022); Cardioversion (N/A, 07/25/2022); Cardioversion (N/A, 09/21/2022); Atrial fibrillation ablation (N/A, 11/17/2022); and Reverse shoulder arthroplasty (Left, 02/20/2023). [name] has a current medication list which includes the following prescription(s): acetaminophen, amiodarone, amlodipine, benazepril, buspirone, carboxymethylcellulose, cyanocobalamin, cyclobenzaprine, duloxetine, ferrous bisglycinate chelate, fluticasone, hydrocodone-acetaminophen, [START ON 04/18/2023] hydrocodone-acetaminophen, melatonin, multivitamin with minerals, ondansetron, oxycodone, xarelto, trazodone, and zepbound. His primarily concern today is the Knee Pain (both)  Initial Vital Signs:  Pulse/HCG Rate: 90  Temp: (!) 97.1 F (36.2 C) Resp:   BP: 132/85 SpO2: 97 %  BMI: Estimated body mass index is 47.06 kg/m as calculated from the following:   Height as of this encounter: 6' (1.829 m).   Weight as of this encounter: 347 lb (157.4 kg).  Risk Assessment: Allergies: Reviewed. He is allergic to bupivacaine liposome.  Allergy Precautions: None required Coagulopathies: Reviewed. None identified.  Blood-thinner therapy: None at this time Active Infection(s): Reviewed. None identified. [name] is afebrile  Site Confirmation: [name] was asked to confirm the procedure and laterality before marking the site Procedure checklist: Completed Consent: Before the procedure and under the influence of no sedative(s), amnesic(s), or anxiolytics, the patient was informed of the treatment options, risks and possible complications. To  fulfill our ethical and legal obligations, as recommended by the American Medical Association's Code of Ethics, I have informed the patient of my clinical impression; the nature and purpose of the treatment or procedure; the risks, benefits, and possible complications of the intervention; the alternatives, including doing nothing; the risk(s) and benefit(s) of the alternative treatment(s) or procedure(s); and the risk(s) and benefit(s) of  doing nothing. The patient was provided information about the general risks and possible complications associated with the procedure. These may include, but are not limited to: failure to achieve desired goals, infection, bleeding, organ or nerve damage, allergic reactions, paralysis, and death. In addition, the patient was informed of those risks and complications associated to the procedure, such as failure to decrease pain; infection; bleeding; organ or nerve damage with subsequent damage to sensory, motor, and/or autonomic systems, resulting in permanent pain, numbness, and/or weakness of one or several areas of the body; allergic reactions; (i.e.: anaphylactic reaction); and/or death. Furthermore, the patient was informed of those risks and complications associated with the medications. These include, but are not limited to: allergic reactions (i.e.: anaphylactic or anaphylactoid reaction(s)); adrenal axis suppression; blood sugar elevation that in diabetics may result in ketoacidosis or comma; water retention that in patients with history of congestive heart failure may result in shortness of breath, pulmonary edema, and decompensation with resultant heart failure; weight gain; swelling or edema; medication-induced neural toxicity; particulate matter embolism and blood vessel occlusion with resultant organ, and/or nervous system infarction; and/or aseptic necrosis of one or more joints. Finally, the patient was informed that Medicine is not an exact science; therefore,  there is also the possibility of unforeseen or unpredictable risks and/or possible complications that may result in a catastrophic outcome. The patient indicated having understood very clearly. We have given the patient no guarantees and we have made no promises. Enough time was given to the patient to ask questions, all of which were answered to the patient's satisfaction. Mr. Chesnut has indicated that he wanted to continue with the procedure. Attestation: I, the ordering provider, attest that I have discussed with the patient the benefits, risks, side-effects, alternatives, likelihood of achieving goals, and potential problems during recovery for the procedure that I have provided informed consent. Date  Time: 03/28/2023 10:51 AM  Pre-Procedure Preparation:  Monitoring: As per clinic protocol. Respiration, ETCO2, SpO2, BP, heart rate and rhythm monitor placed and checked for adequate function Safety Precautions: Patient was assessed for positional comfort and pressure points before starting the procedure. Time-out: I initiated and conducted the "Time-out" before starting the procedure, as per protocol. The patient was asked to participate by confirming the accuracy of the "Time Out" information. Verification of the correct person, site, and procedure were performed and confirmed by me, the nursing staff, and the patient. "Time-out" conducted as per Joint Commission's Universal Protocol (UP.01.01.01). Time: 1128 Start Time: 1128 hrs.  Description of Procedure:          Area Prepped: Entire palmar and dorsal aspect of hand, up to forearm area. DuraPrep (Iodine Povacrylex [0.7% available iodine] and Isopropyl Alcohol, 74% w/w) Safety Precautions: Aspiration looking for blood return was conducted prior to all injections. At no point did we inject any substances, as a needle was being advanced. No attempts were made at seeking any paresthesias. Safe injection practices and needle disposal techniques used.  Medications properly checked for expiration dates. SDV (single dose vial) medications used. Description of the Procedure: Protocol guidelines were followed. The patient was placed in position. The target area was identified and prepped in the usual manner. Skin & deeper tissues infiltrated with local anesthetic. Appropriate time provided for local anesthetics to take effect. The procedure needle was slowly advanced to target area. Proper needle placement secured. Negative aspiration confirmed. Solution injected in intermittent fashion, asking for systemic symptoms every 0.5cc. Needle(s) removed and area cleaned, making sure to leave some  prepping solution back to take advantage of its long term bactericidal properties.  Vitals:   03/28/23 1056  BP: 132/85  Pulse: 90  Temp: (!) 97.1 F (36.2 C)  SpO2: 97%  Weight: (!) 347 lb (157.4 kg)  Height: 6' (1.829 m)    Start Time: 1128 hrs. End Time: 1140 hrs. Materials:  Needle(s) Type: Regular needle Gauge: 25G Length: 1.5-in Medication(s): Please see orders for medications and dosing details.  3 cc solution made of 2 cc of 2% Lidocaine, 1 cc of Decadron 10 mg/cc.  1.5 cc injected for the A1 pulley, 1.5 cc injected for the A3 pulley for right middle finger.     Post-operative Assessment:  Post-procedure Vital Signs:  Pulse/HCG Rate: 90  Temp: (!) 97.1 F (36.2 C) Resp:   BP: 132/85 SpO2: 97 %  EBL: None  Complications: No immediate post-treatment complications observed by team, or reported by patient.  Note: The patient tolerated the entire procedure well. A repeat set of vitals were taken after the procedure and the patient was kept under observation following institutional policy, for this type of procedure. Post-procedural neurological assessment was performed, showing return to baseline, prior to discharge. The patient was provided with post-procedure discharge instructions, including a section on how to identify potential  problems. Should any problems arise concerning this procedure, the patient was given instructions to immediately contact us, at any time, without hesitation. In any case, we plan to contact the patient by telephone for a follow-up status report regarding this interventional procedure.  Comments:  No additional relevant information.  Plan of Care (POC)  Orders:  No orders of the defined types were placed in this encounter.  Chronic Opioid Analgesic:   hydrocodone 7.5 mg 3 times daily as needed     Medications ordered for procedure: Meds ordered this encounter  Medications   lidocaine (XYLOCAINE) 2 % (with pres) injection 400 mg   sodium hyaluronate (viscosup) (GELSYN-3) intra-articular injection 16.8 mg    Do not substitute. Deliver to facility day before procedure.   sodium hyaluronate (viscosup) (GELSYN-3) intra-articular injection 16.8 mg    Do not substitute. Deliver to facility day before procedure.   dexamethasone (DECADRON) injection 10 mg   Medications administered: We administered lidocaine, sodium hyaluronate (viscosup), sodium hyaluronate (viscosup), and dexamethasone.  See the medical record for exact dosing, route, and time of administration.  Follow-up plan:   Return for Keep sch. appt.       Recent Visits Date Type Provider Dept  02/13/23 Office Visit Edward Jolly, MD [facility]  01/08/23 Procedure visit Edward Jolly, MD [facility]  Showing recent visits within past 90 days and meeting all other requirements Today's Visits Date Type Provider Dept  03/28/23 Procedure visit Edward Jolly, MD [facility]  Showing today's visits and meeting all other requirements Future Appointments Date Type Provider Dept  05/09/23 Appointment Edward Jolly, MD [facility]  Showing future appointments within next 90 days and meeting all other requirements  Disposition: Discharge home  Discharge (Date  Time): 03/28/2023; 1141 hrs.    Primary Care Physician: Smitty Cords, DO Location: [facility] Note by: Edward Jolly, MD (TTS technology used. I apologize for any typographical errors that were not detected and corrected.) Date: 03/28/2023; Time: 11:54 AM  Disclaimer:  Medicine is not an Visual merchandiser. The only guarantee in medicine is that nothing is guaranteed. It is important to note that the decision to proceed with this intervention was  based on the information collected from the patient. The Data and conclusions were drawn from the patient's questionnaire, the interview, and the physical examination. Because the information was provided in large part by the patient, it cannot be guaranteed that it has not been purposely or unconsciously manipulated. Every effort has been made to obtain as much relevant data as possible for this evaluation. It is important to note that the conclusions that lead to this procedure are derived in large part from the available data. Always take into account that the treatment will also be dependent on availability of resources and existing treatment guidelines, considered by other Pain Management Practitioners as being common knowledge and practice, at the time of the intervention. For Medico-Legal purposes, it is also important to point out that variation in procedural techniques and pharmacological choices are the acceptable norm. The indications, contraindications, technique, and results of the above procedure should only be interpreted and judged by a Board-Certified Interventional Pain Specialist with extensive familiarity and expertise in the same exact procedure and technique.

## 2023-03-29 ENCOUNTER — Telehealth: Payer: Self-pay

## 2023-03-29 DIAGNOSIS — M75122 Complete rotator cuff tear or rupture of left shoulder, not specified as traumatic: Secondary | ICD-10-CM | POA: Diagnosis not present

## 2023-03-29 NOTE — Telephone Encounter (Signed)
 Patient states no issues post-procedure.

## 2023-04-02 ENCOUNTER — Ambulatory Visit: Payer: Medicare Other

## 2023-04-02 DIAGNOSIS — R293 Abnormal posture: Secondary | ICD-10-CM | POA: Diagnosis not present

## 2023-04-02 DIAGNOSIS — G8929 Other chronic pain: Secondary | ICD-10-CM | POA: Diagnosis not present

## 2023-04-02 DIAGNOSIS — M6281 Muscle weakness (generalized): Secondary | ICD-10-CM | POA: Diagnosis not present

## 2023-04-02 DIAGNOSIS — M25512 Pain in left shoulder: Secondary | ICD-10-CM | POA: Diagnosis not present

## 2023-04-02 NOTE — Therapy (Addendum)
 OUTPATIENT PHYSICAL THERAPY SHOULDER TREATMENT (NO CHARGE)  Patient Name: [name] MRN: [id number] DOB:10-Jul-1955, 68 y.o., male Today's Date: 04/15/2023  Past Medical History:  Diagnosis Date   (HFpEF) heart failure with preserved ejection fraction (HCC)    a.) TTE 12/26/2013: EF 45-50%, mild ant and antsept HK. mild MR. Mod dil LA. nl RV fxn. Rhythm was Afib; b.) TTE 07/04/2019: EF 55%, mid-apical anteroseptal HK, mild MAC, mild Ao sclerosis, G2DD, RVSP 45.3; c.) TTE 06/02/2022: EF 55-60%, no RWMAs, sev LAE, RVSP 37.9, mild-mod MR, mild AR, AoV sclerosis without stenosis, asc Ao 38 mm   Anxiety    Aortic atherosclerosis (HCC)    Cardiomyopathy (in setting of Afib)    a.) TTE 12/26/2013: EF 45-50%; b.) TTE 07/04/2019: EF 55%; c.) TTE 06/02/2022: EF 55-60%   Chronic pain syndrome    a.) followed by pain management   Chronic, continuous use of opioids    a.) on COT; followed by pain management   Coronary artery disease 11/06/2014   a.) cCTA 11/06/2014: Ca2+ = 224 (74th %ile; LAD distribution)   Diverticulosis    DJD (degenerative joint disease) of knee    Former smoker    History of hiatal hernia    History of kidney stones 2012   Hyperlipidemia    Hyperplastic colon polyp    Hypertension    Inguinal hernia, left    Internal hemorrhoids    Intervertebral disc disorder with radiculopathy of lumbosacral region    Lesion of bone of lumbosacral spine    L5   Long term current use of amiodarone    Lower extremity edema    Mixed hyperlipidemia    Morbid obesity (HCC)    Multiple falls    Myalgia due to statin    On rivaroxaban therapy    Osteoarthritis    PAF (paroxysmal atrial fibrillation) (HCC)    a.) CHA2DS2VASc = 4 (age, HTN, CHF, vascular disease) as of 02/19/23;  b.) s/p DCCV 03/13/14 (200 J), 05/15/19 (150 J x 1, 200 J x2), 05/19/2022 (150 J x1, 200 J x2; pads changed to ant/lat postion with additional 200 J x 2 -> did not convert), 07/25/22 (200 J), 09/21/2022 (200 J);  c.) s/p PVI ablation 11/17/2022; d.) rate/rhythm maintained on amiodarone; chronically anticoagulated on rivaroxaban   Pernicious anemia    Prostate cancer (HCC)    Pulmonary nodule, right    a. 10/2014 Cardiac CTA: 7mm RLL nodule; b. 04/2015 CT Chest: stable 7mm RLL nodule. No new nodules; 02/2017 CTA Chest: stable, benign, 7mm RLL pulm nodule.   Rotator cuff tear, left    Sleep difficulties    a.) takes melatonin + trazodone PRN   SVT (supraventricular tachycardia) (HCC)    Tubular adenoma of colon    Past Surgical History:  Procedure Laterality Date   ATRIAL FIBRILLATION ABLATION N/A 11/17/2022   Procedure: ATRIAL FIBRILLATION ABLATION; Location: [facility]; Surgeon: Sheran Luz, MD   BICEPT TENODESIS Right 09/27/2021   Procedure: Right reverse shoulder arthroplasty, biceps tenodesis;  Surgeon: Signa Kell, MD;  Location: [facility];  Service: Orthopedics;  Laterality: Right;   CARDIOVERSION N/A 05/15/2019   Procedure: CARDIOVERSION;  Surgeon: Antonieta Iba, MD;  Location: [facility];  Service: Cardiovascular;  Laterality: N/A;   CARDIOVERSION N/A 03/13/2014   Procedure: CARDIOVERSION; Location: [facility]; Surgeon: [name], MD   CARDIOVERSION N/A 03/24/2022   Procedure: CARDIOVERSION;  Surgeon: Antonieta Iba, MD;  Location: [facility];  Service: Cardiovascular;  Laterality: N/A;   CARDIOVERSION N/A  05/19/2022   Procedure: CARDIOVERSION;  Surgeon: Antonieta Iba, MD;  Location: [facility];  Service: Cardiovascular;  Laterality: N/A;   CARDIOVERSION N/A 07/25/2022   Procedure: CARDIOVERSION;  Surgeon: Duke Salvia, MD;  Location: [facility];  Service: Cardiovascular;  Laterality: N/A;   CARDIOVERSION N/A 09/21/2022   Procedure: CARDIOVERSION;  Surgeon: Duke Salvia, MD;  Location: [facility];  Service: Cardiovascular;  Laterality: N/A;   CARPAL TUNNEL RELEASE Right 12/23/2014   Procedure: CARPAL TUNNEL RELEASE;  Surgeon: Deeann Saint, MD;  Location: [facility];  Service: Orthopedics;   Laterality: Right;   CARPAL TUNNEL RELEASE Left 01/06/2015   Procedure: CARPAL TUNNEL RELEASE;  Surgeon: Deeann Saint, MD;  Location: [facility];  Service: Orthopedics;  Laterality: Left;   COLONOSCOPY WITH PROPOFOL N/A 10/08/2017   Procedure: COLONOSCOPY WITH PROPOFOL;  Surgeon: Toney Reil, MD;  Location[facility];  Service: Gastroenterology;  Laterality: N/A;   COLONOSCOPY WITH PROPOFOL N/A 06/07/2022   Procedure: COLONOSCOPY WITH PROPOFOL;  Surgeon: Toney Reil, MD;  Location: [facility]COPY;  Service: Gastroenterology;  Laterality: N/A;   ENTEROSCOPY  06/07/2022   Procedure: ENTEROSCOPY;  Surgeon: Toney Reil, MD;  Location: [facility];  Service: Gastroenterology;;   ESOPHAGOGASTRODUODENOSCOPY (EGD) WITH PROPOFOL N/A 06/07/2022   Procedure: ESOPHAGOGASTRODUODENOSCOPY (EGD) WITH PROPOFOL;  Surgeon: Toney Reil, MD;  Location: [facility] ENDOSCOPY;  Service: Gastroenterology;  Laterality: N/A;   GASTRIC BYPASS  01/17/2000   KNEE SURGERY Right    knee trauma x3   prostate seeding     REVERSE SHOULDER ARTHROPLASTY Right 09/27/2021   Procedure: Right reverse shoulder arthroplasty, biceps tenodesis;  Surgeon: Signa Kell, MD;  Location: [facility];  Service: Orthopedics;  Laterality: Right;   REVERSE SHOULDER ARTHROPLASTY Left 02/20/2023   Procedure: Left reverse shoulder arthroplasty, biceps tenodesis;  Surgeon: Signa Kell, MD;  Location: [facility];  Service: Orthopedics;  Laterality: Left;   SHOULDER ARTHROSCOPY WITH ROTATOR CUFF REPAIR AND OPEN BICEPS TENODESIS Left 12/27/2021   Procedure: Left shoulder arthroscopic cuff repair (supraspinatus and subscapularis) with Regeneten Patch application;  Surgeon: Signa Kell, MD;  Location: [facility];  Service: Orthopedics;  Laterality: Left;   Patient Active Problem List   Diagnosis Date Noted   Symptomatic anemia 04/26/2022   Chronic pain of both knees 03/30/2022   Bilateral primary osteoarthritis of knee  03/30/2022   Shortness of breath 03/24/2022   Vitamin D deficiency 02/24/2022   S/p reverse total shoulder arthroplasty 09/27/2021   Lesion of bone of lumbosacral spine (L5) 05/12/2021   Localized osteoarthritis of shoulder regions, bilateral 03/29/2021   Chronic pain of both shoulders 03/29/2021   Drug-induced myopathy 02/21/2021   Trigger finger of right hand 11/15/2020   Prostate cancer (HCC) 07/26/2020   Insomnia 08/01/2019   Primary osteoarthritis of both wrists 02/17/2019   Trigger middle finger of left hand 02/17/2019   Chronic pain syndrome 02/17/2019   Chronic radicular lumbar pain 10/08/2018   Lumbar radiculopathy 10/08/2018   Lumbar degenerative disc disease 10/08/2018   Lumbar facet arthropathy 10/08/2018   Lumbar facet joint syndrome 10/08/2018   Intervertebral disc disorder with radiculopathy of lumbosacral region    Osteoarthritis of knee 11/30/2017   Osteoarthritis of wrist 11/30/2017   Pernicious anemia 05/22/2016   Persistent atrial fibrillation (HCC) 12/20/2015   Carpal tunnel syndrome 11/16/2014   DJD (degenerative joint disease) of knee 10/27/2014   Bilateral carpal tunnel syndrome 10/27/2014   Morbid obesity with BMI of 45.0-49.9, adult (HCC) 10/27/2014   Mixed hyperlipidemia 10/27/2014  H/O gastric bypass 03/20/2014   Hyperkalemia 03/20/2014   History of prostate cancer 12/26/2013   Essential hypertension 12/26/2013   Encounter for anticoagulation discussion and counseling 12/26/2013    PCP: Dr. Althea Charon  REFERRING PROVIDER: Dr. Signa Kell  REFERRING DIAG: [phone] (ICD-10-CM) - Complete rotator cuff tear or rupture of left shoulder, not specified as traumatic   RATIONALE FOR EVALUATION AND TREATMENT: Rehabilitation  THERAPY DIAG: Chronic left shoulder pain  Muscle weakness (generalized)  Abnormal posture  ONSET DATE: 02/20/23  FOLLOW-UP APPT SCHEDULED WITH REFERRING PROVIDER: Yes   FROM INITIAL EVALUATION SUBJECTIVE:                                                                                                                                                                                          SUBJECTIVE STATEMENT:  L reverse TSR 02/20/23  PERTINENT HISTORY:  JERIC SLAGEL is a 68 y.o. male who initially underwent arthroscopic subscapularis repair and side-to-side supraspinatus repair with Regeneten patch application by Dr. Allena Katz 12/27/2021. He underwent post-surgical physical therapy with initial minimal improvement followed by worsening shoulder strength/function. Repeat MRI showed large, retracted subscapularis re-tear and supraspinatus re-tear with signs of rotator cuff arthropathy. Conservative measures including medications, physical therapy, and cortisone injections did not provide adequate relief. The patient elected to proceed with reverse shoulder arthroplasty and biceps tenodesis on 02/20/23. Intraoperative findings include retracted full-thickness subscapularis tear, partial-thickness supraspinatus tear, and biceps tendinopathy. Surgery was a standard deltopectoral incision under general anesthesia along with an interscalene block. Per op note, operative arm to remain in sling at all times except ROM exercises and hygiene. Can perform pendulums, elbow/wrist/hand ROM exercises. Passive ROM allowed to 90 FF and 30 ER. ASA 325mg  x 6 weeks for DVT ppx (pt states that he was actually instructed to start back on his Xarelto and not to use ASA). Plan for PT starting on POD #3-4. Pt denies any post-operative complications from surgery. Pt has a history of chronic back pain and has undergone multiple lumbar facet, medial branch radiofrequency ablations. He also has a past history of cardiomyopathy (in setting of Afib), DJD (degenerative joint disease) of knee, kidney stones, lower extremity edema, PAF (paroxysmal atrial fibrillation), pernicious anemia, prostate cancer, and a pulmonary nodule. He also has a past surgical history that  includes R reverse TSR, knee surgery, gastric bypass (2002), prostate seeding, carpal tunnel release (Right, 12/23/2014; Left, 01/06/2015), and multiple cardioversions.   PAIN:  Pain Intensity: No resting pain upon arrival Pain location: L shoulder Pain Quality: "Nerve pain" Radiating: No  Numbness/Tingling: No Focal Weakness: Yes 24-hour pain behavior: Varies History of prior shoulder or neck/shoulder injury,  pain, surgery, or therapy: Yes PT for L shoulder s/p RTC repair, history of R reverse TSR; Dominant hand: right Red flags Positive: history of prostate CA, Negative: chills/fever, night sweats, nausea, vomiting, unrelenting pain, unexplained weight gain/loss  PRECAUTIONS: Shoulder, per operative note: operative arm to remain in sling at all times except ROM exercises and hygiene. Can perform pendulums, elbow/wrist/hand ROM exercises. Passive ROM allowed to 90 FF and 30 ER.   General reverse TSR precautions include no combined shoulder extension, IR, and adduction. No shoulder and elbow AROM (bicep tenodesis) for at least the first 4 weeks   WEIGHT BEARING RESTRICTIONS: Yes no WB through LUE  FALLS: Has patient fallen in last 6 months? 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  Recent memory is intact.  Remote memory is intact.  Attention  span and concentration are intact.  Expressive speech is intact.  Patient's fund of knowledge is within normal limits for educational level.    Gross Musculoskeletal Assessment Tremor: None Bulk: Normal Tone: Normal Incision is clean and dry with minimal blood staining on honeycomb dressing as well as minimal swelling around the incision. Skin is cool to touch and no signs of infection present.   Gait Deferred full gait assessment  Posture Forward head  with upper thoracic kyphosis and rounded shoulders  Cervical Screen Deferred  ROM ROM (Normal range in degrees)    Right (AROM) Left (PROM)  Shoulder    Flexion 160 90 (limited per protocol)  Extension    Abduction 180 90 (scaption)  External Rotation 88 0  Internal Rotation 70 stomach  Hands Behind Head    Hands Behind Back        Elbow    Flexion Full Full  Extension 0 0  Pronation WNL Full palm up  Supination WNL Full palm down  (* = pain; Blank rows = not tested)  UE MMT: MMT (out of 5) Right Left   Shoulder   Flexion 5   Extension    Abduction 5   External rotation 5   Internal rotation 5   Horizontal abduction    Horizontal adduction    Lower Trapezius    Rhomboids        Elbow  Flexion 5   Extension 5   Pronation 5   Supination 5       Wrist  Flexion 5 5  Extension 5 5  Radial deviation  5  Ulnar deviation  5      MCP  Flexion 5 5  Extension 5 5  Abduction 5 5  Adduction 5 5  (* = pain; Blank rows = not tested)  Sensation Deferred  Reflexes Deferred  Palpation No tenderness to light palpation around entire L shoulder girdle;  Passive Accessory Intervertebral Motion Deferred  Accessory Motions/Glides Deferred  Muscle Length Testing Deferred  SPECIAL TESTS Deferred  Beighton scale Deferred   TODAY'S TREATMENT  Manual: Jt PROM to [facility]  and Scapular mobs Scapular stabilization with Manual cues.  PROM in Flex/Scaption/ABD AAROM with PT assist to 90 degrees  in Flex/Ab,  ER/IR 50 to 30 deg.   There ex:  Scapular retraction 3 x 10 reps Shldr shrugs 3 x 10 reps Elbow Flex/Ext #2 3 x 10 reps Wrist Flex/Ext/UD/RD with #3 2 x 10 reps each Blue T Putty 30 squeezes  Pt education about healing process and          HOME EXERCISE PROGRAM:  Access Code: [email or web address] URL: [email or web address] Date: 02/28/2023 Prepared by: Ria Comment  Exercises - Seated Cervical Sidebending Stretch (Mirrored)  - 2 x daily - 7 x weekly - 3 reps - 30s hold - Seated Scapular Retraction  - 2 x daily - 7 x weekly - 2 sets - 10 reps - 3s hold - Circular Shoulder Pendulum with Table Support  - 2 x daily - 7 x weekly - 3 reps - 60s hold - Horizontal Shoulder Pendulum with Table Support  - 2 x daily - 7 x weekly - 3 reps - 60s hold - Flexion-Extension Shoulder Pendulum with Table Support (Mirrored)  - 2 x daily - 7 x weekly - 3 reps - 60s hold - Wrist Flexion AROM  - 2 x daily - 7 x weekly - 2 sets - 10 reps - 3s hold - Wrist Extension AROM  - 1 x daily - 7 x weekly - 2 sets - 10 reps - 3s hold    ASSESSMENT: Clinical Impression: Pt return after consulting Dr. Allena Katz and is cleared by Dr Allena Katz to continue with PT as per protocol. Pt without any complaint, Also advised to wean off the Sling slowly. PT educated to take of sling during eating meals and watching TV. PT provided with Elbow and Wrist exs with #2 lbs wts a thome in seated position. Pendulum D/C. Pt introduced to AAROM, Pari scapular muscle activation and scapular stabilization es. Pt provided with Blue T putty to activate shldr intrinsic muscles. Pt has atrophy of UT/LT/MT/ Rhomboids/Ant/Post Deltoid/ muscles contributing to subluxation. PT plans to provide Shldr isometrics next week to promote stability. Pt tol tx well without discomfort. ROM ~ 160 Flex/Abd, ER 30 deg/IR 50 deg. Continue as per Protocol and current POC remains effective.    OBJECTIVE IMPAIRMENTS: decreased ROM, decreased strength, and pain.    ACTIVITY LIMITATIONS: carrying, lifting, bathing, toileting, dressing, and reach over head  PARTICIPATION LIMITATIONS: meal prep, cleaning, laundry, driving, shopping, community activity, and yard work  PERSONAL FACTORS: Past/current experiences, Time since onset of injury/illness/exacerbation, and 3+ comorbidities: OA, anemia, anxiety, cardiomyopathy, chronic pain  are also affecting patient's functional outcome.   REHAB POTENTIAL: Good  CLINICAL DECISION MAKING: Unstable/unpredictable  EVALUATION COMPLEXITY: High   GOALS: Goals reviewed with patient? Yes  SHORT TERM GOALS: Target date: 04/09/2023  Pt will be independent with HEP to improve strength and decrease shoulder pain to improve pain-free function at home and work. Baseline:  Goal status: INITIAL   LONG TERM GOALS: Target date: 05/21/2023  1.  Pt will report no further L shoulder pain with all AROM in order to complete all ADLs and improve pain-free function. Baseline:  Goal status: INITIAL  2.  Pt will decrease QuickDASH score by at least 8% in order to demonstrate clinically significant reduction in disability related to shoulder pain        Baseline: 02/26/23: 72.7% Goal status: INITIAL  3. Pt will increase strength of L shoulder flexion and scaption to at least  4/5 in order to demonstrate improvement in strength and function         Baseline: 02/26/23: Unable to test Goal status: INITIAL  4. Pt will improve L shoulder AROM flexion and scaption to at least 110 degrees in order to demonstrate improvement in function so he can take care of his property and complete all household responsibilities;        Baseline: 02/26/23: Unable to test (90 degrees PROM for both) Goal status: INITIAL   PLAN: PT FREQUENCY: 1-2x/week  PT DURATION: 12 weeks  PLANNED INTERVENTIONS: Therapeutic exercises, Therapeutic activity, Neuromuscular re-education, Balance training, Gait training, Patient/Family education, Self Care, Joint  mobilization, Joint manipulation, Vestibular training, Canalith repositioning, Orthotic/Fit training, DME instructions, Dry Needling, Electrical stimulation, Spinal manipulation, Spinal mobilization, Cryotherapy, Moist heat, Taping, Traction, Ultrasound, Ionotophoresis 4mg /ml Dexamethasone, Manual therapy, and Re-evaluation.  PLAN FOR NEXT SESSION: Sensation testing, progress L shoulder per surgical protocol;   Janet Berlin PT DPT 5:11 PM,04/15/23

## 2023-04-04 ENCOUNTER — Ambulatory Visit: Payer: Medicare Other

## 2023-04-04 DIAGNOSIS — R293 Abnormal posture: Secondary | ICD-10-CM

## 2023-04-04 DIAGNOSIS — M6281 Muscle weakness (generalized): Secondary | ICD-10-CM | POA: Diagnosis not present

## 2023-04-04 DIAGNOSIS — M25512 Pain in left shoulder: Secondary | ICD-10-CM | POA: Diagnosis not present

## 2023-04-04 DIAGNOSIS — G8929 Other chronic pain: Secondary | ICD-10-CM | POA: Diagnosis not present

## 2023-04-04 NOTE — Therapy (Signed)
 OUTPATIENT PHYSICAL THERAPY SHOULDER TREATMENT  Patient Name: [name] MRN: [id number] DOB:12-27-1955, 68 y.o., male Today's Date: 04/04/2023   PT End of Session - 04/04/23 1357     Visit Number 4    Number of Visits 25    Date for PT Re-Evaluation 05/21/23    Authorization Type eval: 02/26/23, [employer] Medicare, GL:OVFIE on AUTH    Authorization Time Period [facility] medicare 2024  PP:[id number] on [place]    PT Start Time 1400    PT Stop Time 1441    PT Time Calculation (min) 41 min    Activity Tolerance Patient tolerated treatment well    Behavior During Therapy WFL for tasks assessed/performed              Past Medical History:  Diagnosis Date   (HFpEF) heart failure with preserved ejection fraction (HCC)    a.) TTE 12/26/2013: EF 45-50%, mild ant and antsept HK. mild MR. Mod dil LA. nl RV fxn. Rhythm was Afib; b.) TTE 07/04/2019: EF 55%, mid-apical anteroseptal HK, mild MAC, mild Ao sclerosis, G2DD, RVSP 45.3; c.) TTE 06/02/2022: EF 55-60%, no RWMAs, sev LAE, RVSP 37.9, mild-mod MR, mild AR, AoV sclerosis without stenosis, asc Ao 38 mm   Anxiety    Aortic atherosclerosis (HCC)    Cardiomyopathy (in setting of Afib)    a.) TTE 12/26/2013: EF 45-50%; b.) TTE 07/04/2019: EF 55%; c.) TTE 06/02/2022: EF 55-60%   Chronic pain syndrome    a.) followed by pain management   Chronic, continuous use of opioids    a.) on COT; followed by pain management   Coronary artery disease 11/06/2014   a.) cCTA 11/06/2014: Ca2+ = 224 (74th %ile; LAD distribution)   Diverticulosis    DJD (degenerative joint disease) of knee    Former smoker    History of hiatal hernia    History of kidney stones 2012   Hyperlipidemia    Hyperplastic colon polyp    Hypertension    Inguinal hernia, left    Internal hemorrhoids    Intervertebral disc disorder with radiculopathy of lumbosacral region    Lesion of bone of lumbosacral spine    L5   Long term current use of amiodarone    Lower extremity edema    Mixed  hyperlipidemia    Morbid obesity (HCC)    Multiple falls    Myalgia due to statin    On rivaroxaban therapy    Osteoarthritis    PAF (paroxysmal atrial fibrillation) (HCC)    a.) CHA2DS2VASc = 4 (age, HTN, CHF, vascular disease) as of 02/19/23;  b.) s/p DCCV 03/13/14 (200 J), 05/15/19 (150 J x 1, 200 J x2), 05/19/2022 (150 J x1, 200 J x2; pads changed to ant/lat postion with additional 200 J x 2 -> did not convert), 07/25/22 (200 J), 09/21/2022 (200 J); c.) s/p PVI ablation 11/17/2022; d.) rate/rhythm maintained on amiodarone; chronically anticoagulated on rivaroxaban   Pernicious anemia    Prostate cancer (HCC)    Pulmonary nodule, right    a. 10/2014 Cardiac CTA: 7mm RLL nodule; b. 04/2015 CT Chest: stable 7mm RLL nodule. No new nodules; 02/2017 CTA Chest: stable, benign, 7mm RLL pulm nodule.   Rotator cuff tear, left    Sleep difficulties    a.) takes melatonin + trazodone PRN   SVT (supraventricular tachycardia) (HCC)    Tubular adenoma of colon    Past Surgical History:  Procedure Laterality Date   ATRIAL FIBRILLATION ABLATION N/A 11/17/2022   Procedure:  ATRIAL FIBRILLATION ABLATION; Location: [facility]; Surgeon: Sheran Luz, MD   BICEPT TENODESIS Right 09/27/2021   Procedure: Right reverse shoulder arthroplasty, biceps tenodesis;  Surgeon: Signa Kell, MD;  Location: [facility];  Service: Orthopedics;  Laterality: Right;   CARDIOVERSION N/A 05/15/2019   Procedure: CARDIOVERSION;  Surgeon: Antonieta Iba, MD;  Location: [facility];  Service: Cardiovascular;  Laterality: N/A;   CARDIOVERSION N/A 03/13/2014   Procedure: CARDIOVERSION; Location: [facility]; Surgeon: [name], MD   CARDIOVERSION N/A 03/24/2022   Procedure: CARDIOVERSION;  Surgeon: Antonieta Iba, MD;  Location: [facility];  Service: Cardiovascular;  Laterality: N/A;   CARDIOVERSION N/A 05/19/2022   Procedure: CARDIOVERSION;  Surgeon: Antonieta Iba, MD;  Location: [facility];  Service: Cardiovascular;  Laterality: N/A;    CARDIOVERSION N/A 07/25/2022   Procedure: CARDIOVERSION;  Surgeon: Duke Salvia, MD;  Location: [facility];  Service: Cardiovascular;  Laterality: N/A;   CARDIOVERSION N/A 09/21/2022   Procedure: CARDIOVERSION;  Surgeon: Duke Salvia, MD;  Location: [facility];  Service: Cardiovascular;  Laterality: N/A;   CARPAL TUNNEL RELEASE Right 12/23/2014   Procedure: CARPAL TUNNEL RELEASE;  Surgeon: Deeann Saint, MD;  Location: [facility];  Service: Orthopedics;  Laterality: Right;   CARPAL TUNNEL RELEASE Left 01/06/2015   Procedure: CARPAL TUNNEL RELEASE;  Surgeon: Deeann Saint, MD;  Location: [facility];  Service: Orthopedics;  Laterality: Left;   COLONOSCOPY WITH PROPOFOL N/A 10/08/2017   Procedure: COLONOSCOPY WITH PROPOFOL;  Surgeon: Toney Reil, MD;  Location[facility];  Service: Gastroenterology;  Laterality: N/A;   COLONOSCOPY WITH PROPOFOL N/A 06/07/2022   Procedure: COLONOSCOPY WITH PROPOFOL;  Surgeon: Toney Reil, MD;  Location: [facility] ENDOSCOPY;  Service: Gastroenterology;  Laterality: N/A;   ENTEROSCOPY  06/07/2022   Procedure: ENTEROSCOPY;  Surgeon: Toney Reil, MD;  Location: [facility]OSCOPY;  Service: Gastroenterology;;   ESOPHAGOGASTRODUODENOSCOPY (EGD) WITH PROPOFOL N/A 06/07/2022   Procedure: ESOPHAGOGASTRODUODENOSCOPY (EGD) WITH PROPOFOL;  Surgeon: Toney Reil, MD;  Location: [facility] ENDOSCOPY;  Service: Gastroenterology;  Laterality: N/A;   GASTRIC BYPASS  01/17/2000   KNEE SURGERY Right    knee trauma x3   prostate seeding     REVERSE SHOULDER ARTHROPLASTY Right 09/27/2021   Procedure: Right reverse shoulder arthroplasty, biceps tenodesis;  Surgeon: Signa Kell, MD;  Location: [facility];  Service: Orthopedics;  Laterality: Right;   REVERSE SHOULDER ARTHROPLASTY Left 02/20/2023   Procedure: Left reverse shoulder arthroplasty, biceps tenodesis;  Surgeon: Signa Kell, MD;  Location: [facility];  Service: Orthopedics;  Laterality: Left;   SHOULDER  ARTHROSCOPY WITH ROTATOR CUFF REPAIR AND OPEN BICEPS TENODESIS Left 12/27/2021   Procedure: Left shoulder arthroscopic cuff repair (supraspinatus and subscapularis) with Regeneten Patch application;  Surgeon: Signa Kell, MD;  Location: [facility];  Service: Orthopedics;  Laterality: Left;   Patient Active Problem List   Diagnosis Date Noted   Symptomatic anemia 04/26/2022   Chronic pain of both knees 03/30/2022   Bilateral primary osteoarthritis of knee 03/30/2022   Shortness of breath 03/24/2022   Vitamin D deficiency 02/24/2022   S/p reverse total shoulder arthroplasty 09/27/2021   Lesion of bone of lumbosacral spine (L5) 05/12/2021   Localized osteoarthritis of shoulder regions, bilateral 03/29/2021   Chronic pain of both shoulders 03/29/2021   Drug-induced myopathy 02/21/2021   Trigger finger of right hand 11/15/2020   Prostate cancer (HCC) 07/26/2020   Insomnia 08/01/2019   Primary osteoarthritis of both wrists 02/17/2019   Trigger middle finger of left hand 02/17/2019   Chronic pain  syndrome 02/17/2019   Chronic radicular lumbar pain 10/08/2018   Lumbar radiculopathy 10/08/2018   Lumbar degenerative disc disease 10/08/2018   Lumbar facet arthropathy 10/08/2018   Lumbar facet joint syndrome 10/08/2018   Intervertebral disc disorder with radiculopathy of lumbosacral region    Osteoarthritis of knee 11/30/2017   Osteoarthritis of wrist 11/30/2017   Pernicious anemia 05/22/2016   Persistent atrial fibrillation (HCC) 12/20/2015   Carpal tunnel syndrome 11/16/2014   DJD (degenerative joint disease) of knee 10/27/2014   Bilateral carpal tunnel syndrome 10/27/2014   Morbid obesity with BMI of 45.0-49.9, adult (HCC) 10/27/2014   Mixed hyperlipidemia 10/27/2014   H/O gastric bypass 03/20/2014   Hyperkalemia 03/20/2014   History of prostate cancer 12/26/2013   Essential hypertension 12/26/2013   Encounter for anticoagulation discussion and counseling 12/26/2013    PCP: Dr.  Althea Charon  REFERRING PROVIDER: Dr. Signa Kell  REFERRING DIAG: [phone] (ICD-10-CM) - Complete rotator cuff tear or rupture of left shoulder, not specified as traumatic   RATIONALE FOR EVALUATION AND TREATMENT: Rehabilitation  THERAPY DIAG: Chronic left shoulder pain  Muscle weakness (generalized)  Abnormal posture  ONSET DATE: 02/20/23  FOLLOW-UP APPT SCHEDULED WITH REFERRING PROVIDER: Yes   FROM INITIAL EVALUATION SUBJECTIVE:                                                                                                                                                                                         SUBJECTIVE STATEMENT:  L reverse TSR 02/20/23  PERTINENT HISTORY:  MATTEO BANKE is a 68 y.o. male who initially underwent arthroscopic subscapularis repair and side-to-side supraspinatus repair with Regeneten patch application by Dr. Allena Katz 12/27/2021. He underwent post-surgical physical therapy with initial minimal improvement followed by worsening shoulder strength/function. Repeat MRI showed large, retracted subscapularis re-tear and supraspinatus re-tear with signs of rotator cuff arthropathy. Conservative measures including medications, physical therapy, and cortisone injections did not provide adequate relief. The patient elected to proceed with reverse shoulder arthroplasty and biceps tenodesis on 02/20/23. Intraoperative findings include retracted full-thickness subscapularis tear, partial-thickness supraspinatus tear, and biceps tendinopathy. Surgery was a standard deltopectoral incision under general anesthesia along with an interscalene block. Per op note, operative arm to remain in sling at all times except ROM exercises and hygiene. Can perform pendulums, elbow/wrist/hand ROM exercises. Passive ROM allowed to 90 FF and 30 ER. ASA 325mg  x 6 weeks for DVT ppx (pt states that he was actually instructed to start back on his Xarelto and not to use ASA). Plan for PT starting on POD  #3-4. Pt denies any post-operative complications from surgery. Pt has a history of chronic back pain and has undergone  multiple lumbar facet, medial branch radiofrequency ablations. He also has a past history of cardiomyopathy (in setting of Afib), DJD (degenerative joint disease) of knee, kidney stones, lower extremity edema, PAF (paroxysmal atrial fibrillation), pernicious anemia, prostate cancer, and a pulmonary nodule. He also has a past surgical history that includes R reverse TSR, knee surgery, gastric bypass (2002), prostate seeding, carpal tunnel release (Right, 12/23/2014; Left, 01/06/2015), and multiple cardioversions.   PAIN:  Pain Intensity: No resting pain upon arrival Pain location: L shoulder Pain Quality: "Nerve pain" Radiating: No  Numbness/Tingling: No Focal Weakness: Yes 24-hour pain behavior: Varies History of prior shoulder or neck/shoulder injury, pain, surgery, or therapy: Yes PT for L shoulder s/p RTC repair, history of R reverse TSR; Dominant hand: right Red flags Positive: history of prostate CA, Negative: chills/fever, night sweats, nausea, vomiting, unrelenting pain, unexplained weight gain/loss  PRECAUTIONS: Shoulder, per operative note: operative arm to remain in sling at all times except ROM exercises and hygiene. Can perform pendulums, elbow/wrist/hand ROM exercises. Passive ROM allowed to 90 FF and 30 ER.   General reverse TSR precautions include no combined shoulder extension, IR, and adduction. No shoulder and elbow AROM (bicep tenodesis) for at least the first 4 weeks   WEIGHT BEARING RESTRICTIONS: Yes no WB through LUE  FALLS: Has patient fallen in last 6 months? 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  Recent memory is intact.  Remote memory is intact.  Attention span and concentration are intact.  Expressive speech is intact.  Patient's fund of knowledge is within normal limits for educational level.    Gross Musculoskeletal Assessment Tremor: None Bulk: Normal Tone: Normal Incision is clean and dry with minimal blood staining on honeycomb dressing as well as minimal swelling around the incision. Skin is cool to touch and no signs of infection present.   Gait Deferred full gait assessment  Posture Forward head  with upper thoracic kyphosis and rounded shoulders  Cervical Screen Deferred  ROM ROM (Normal range in degrees)    Right (AROM) Left (PROM)  Shoulder    Flexion 160 90 (limited per protocol)  Extension    Abduction 180 90 (scaption)  External Rotation 88 0  Internal Rotation 70 stomach  Hands Behind Head    Hands Behind Back        Elbow    Flexion Full Full  Extension 0 0  Pronation WNL Full palm up  Supination WNL Full palm down  (* = pain; Blank rows = not tested)  UE MMT: MMT (out of 5) Right Left   Shoulder   Flexion 5   Extension    Abduction 5   External rotation 5   Internal rotation 5   Horizontal abduction    Horizontal adduction    Lower Trapezius    Rhomboids        Elbow  Flexion 5   Extension 5   Pronation 5  Supination 5       Wrist  Flexion 5 5  Extension 5 5  Radial deviation  5  Ulnar deviation  5      MCP  Flexion 5 5  Extension 5 5  Abduction 5 5  Adduction 5 5  (* = pain; Blank rows = not  tested)  Sensation Deferred  Reflexes Deferred  Palpation No tenderness to light palpation around entire L shoulder girdle;  Passive Accessory Intervertebral Motion Deferred  Accessory Motions/Glides Deferred  Muscle Length Testing Deferred  SPECIAL TESTS Deferred  Beighton scale Deferred   TODAY'S TREATMENT: 04/04/23  Subjective: Patient reports no pain on arrival. He premedicated prior to arrival. He is weaning himself out of the sling.    TE:  PROM in L shoulder Flex/ABD/ER AAROM with dowel L shoulder flexion/abduction x 5 minutes  Supine serratus punch with dowel x 15 L Elbow Flex/Ext #3 3 x 10 reps Scapular retraction 3 x 10 reps with 3 second hold  Wrist Flex/Ext/UD/RD with #3 3 x 10 reps each Shoulder rolls x 10   HOME EXERCISE PROGRAM:  Access Code: [email or web address] URL: [email or web address] Date: 02/28/2023 Prepared by: Ria Comment  Exercises - Seated Cervical Sidebending Stretch (Mirrored)  - 2 x daily - 7 x weekly - 3 reps - 30s hold - Seated Scapular Retraction  - 2 x daily - 7 x weekly - 2 sets - 10 reps - 3s hold - Circular Shoulder Pendulum with Table Support  - 2 x daily - 7 x weekly - 3 reps - 60s hold - Horizontal Shoulder Pendulum with Table Support  - 2 x daily - 7 x weekly - 3 reps - 60s hold - Flexion-Extension Shoulder Pendulum with Table Support (Mirrored)  - 2 x daily - 7 x weekly - 3 reps - 60s hold - Wrist Flexion AROM  - 2 x daily - 7 x weekly - 2 sets - 10 reps - 3s hold - Wrist Extension AROM  - 1 x daily - 7 x weekly - 2 sets - 10 reps - 3s hold    ASSESSMENT:  Clinical Impression:    Patient is now post op 6 weeks since L reverse TSA. Session focused on PROM and AAROM of L shoulder as well as elbow and wrist strengthening. Tolerated session well and weaning himself out of the sling as tolerated. Encouraged patient to follow up as directed. Patient will continue to benefit from skilled therapy to address remaining  deficits in order to improve quality of life and return to PLOF.    OBJECTIVE IMPAIRMENTS: decreased ROM, decreased strength, and pain.   ACTIVITY LIMITATIONS: carrying, lifting, bathing, toileting, dressing, and reach over head  PARTICIPATION LIMITATIONS: meal prep, cleaning, laundry, driving, shopping, community activity, and yard work  PERSONAL FACTORS: Past/current experiences, Time since onset of injury/illness/exacerbation, and 3+ comorbidities: OA, anemia, anxiety, cardiomyopathy, chronic pain  are also affecting patient's functional outcome.   REHAB POTENTIAL: Good  CLINICAL DECISION MAKING: Unstable/unpredictable  EVALUATION COMPLEXITY: High   GOALS: Goals reviewed with patient? Yes  SHORT TERM GOALS: Target date: 04/09/2023  Pt will be independent with HEP to improve strength and decrease shoulder pain to improve pain-free function at home and work. Baseline:  Goal status: INITIAL   LONG TERM GOALS: Target date: 05/21/2023  1.  Pt will report no further L shoulder pain with all AROM in order to complete all ADLs and improve pain-free function. Baseline:  Goal status: INITIAL  2.  Pt will  decrease QuickDASH score by at least 8% in order to demonstrate clinically significant reduction in disability related to shoulder pain        Baseline: 02/26/23: 72.7% Goal status: INITIAL  3. Pt will increase strength of L shoulder flexion and scaption to at least 4/5 in order to demonstrate improvement in strength and function         Baseline: 02/26/23: Unable to test Goal status: INITIAL  4. Pt will improve L shoulder AROM flexion and scaption to at least 110 degrees in order to demonstrate improvement in function so he can take care of his property and complete all household responsibilities;        Baseline: 02/26/23: Unable to test (90 degrees PROM for both) Goal status: INITIAL   PLAN: PT FREQUENCY: 1-2x/week  PT DURATION: 12 weeks  PLANNED INTERVENTIONS: Therapeutic  exercises, Therapeutic activity, Neuromuscular re-education, Balance training, Gait training, Patient/Family education, Self Care, Joint mobilization, Joint manipulation, Vestibular training, Canalith repositioning, Orthotic/Fit training, DME instructions, Dry Needling, Electrical stimulation, Spinal manipulation, Spinal mobilization, Cryotherapy, Moist heat, Taping, Traction, Ultrasound, Ionotophoresis 4mg /ml Dexamethasone, Manual therapy, and Re-evaluation.  PLAN FOR NEXT SESSION: Sensation testing, progress L shoulder per surgical protocol;  Maylon Peppers, PT, DPT Physical Therapist - [place]  [facility]  1:57 PM,04/04/23

## 2023-04-09 ENCOUNTER — Ambulatory Visit: Payer: Medicare Other

## 2023-04-09 DIAGNOSIS — G8929 Other chronic pain: Secondary | ICD-10-CM

## 2023-04-09 DIAGNOSIS — M25512 Pain in left shoulder: Secondary | ICD-10-CM | POA: Diagnosis not present

## 2023-04-09 DIAGNOSIS — M6281 Muscle weakness (generalized): Secondary | ICD-10-CM | POA: Diagnosis not present

## 2023-04-09 DIAGNOSIS — R293 Abnormal posture: Secondary | ICD-10-CM | POA: Diagnosis not present

## 2023-04-09 NOTE — Therapy (Signed)
 OUTPATIENT PHYSICAL THERAPY SHOULDER TREATMENT  Patient Name: RHYS LICHTY MRN: [id number] DOB:06-Dec-1955, 68 y.o., male Today's Date: 04/09/2023   PT End of Session - 04/09/23 1624     Visit Number 5    Number of Visits 25    Date for PT Re-Evaluation 05/21/23    Authorization Type eval: 02/26/23, [employer] Medicare, [id number] on AUTH    Authorization Time Period [facility] medicare 2024  [id number] on [place]    PT Start Time 1405    PT Stop Time 1445    PT Time Calculation (min) 40 min    Activity Tolerance Patient tolerated treatment well    Behavior During Therapy WFL for tasks assessed/performed            Past Medical History:  Diagnosis Date   (HFpEF) heart failure with preserved ejection fraction (HCC)    a.) TTE 12/26/2013: EF 45-50%, mild ant and antsept HK. mild MR. Mod dil LA. nl RV fxn. Rhythm was Afib; b.) TTE 07/04/2019: EF 55%, mid-apical anteroseptal HK, mild MAC, mild Ao sclerosis, G2DD, RVSP 45.3; c.) TTE 06/02/2022: EF 55-60%, no RWMAs, sev LAE, RVSP 37.9, mild-mod MR, mild AR, AoV sclerosis without stenosis, asc Ao 38 mm   Anxiety    Aortic atherosclerosis (HCC)    Cardiomyopathy (in setting of Afib)    a.) TTE 12/26/2013: EF 45-50%; b.) TTE 07/04/2019: EF 55%; c.) TTE 06/02/2022: EF 55-60%   Chronic pain syndrome    a.) followed by pain management   Chronic, continuous use of opioids    a.) on COT; followed by pain management   Coronary artery disease 11/06/2014   a.) cCTA 11/06/2014: Ca2+ = 224 (74th %ile; LAD distribution)   Diverticulosis    DJD (degenerative joint disease) of knee    Former smoker    History of hiatal hernia    History of kidney stones 2012   Hyperlipidemia    Hyperplastic colon polyp    Hypertension    Inguinal hernia, left    Internal hemorrhoids    Intervertebral disc disorder with radiculopathy of lumbosacral region    Lesion of bone of lumbosacral spine    L5   Long term current use of amiodarone    Lower extremity edema    Mixed  hyperlipidemia    Morbid obesity (HCC)    Multiple falls    Myalgia due to statin    On rivaroxaban therapy    Osteoarthritis    PAF (paroxysmal atrial fibrillation) (HCC)    a.) CHA2DS2VASc = 4 (age, HTN, CHF, vascular disease) as of 02/19/23;  b.) s/p DCCV 03/13/14 (200 J), 05/15/19 (150 J x 1, 200 J x2), 05/19/2022 (150 J x1, 200 J x2; pads changed to ant/lat postion with additional 200 J x 2 -> did not convert), 07/25/22 (200 J), 09/21/2022 (200 J); c.) s/p PVI ablation 11/17/2022; d.) rate/rhythm maintained on amiodarone; chronically anticoagulated on rivaroxaban   Pernicious anemia    Prostate cancer (HCC)    Pulmonary nodule, right    a. 10/2014 Cardiac CTA: 7mm RLL nodule; b. 04/2015 CT Chest: stable 7mm RLL nodule. No new nodules; 02/2017 CTA Chest: stable, benign, 7mm RLL pulm nodule.   Rotator cuff tear, left    Sleep difficulties    a.) takes melatonin + trazodone PRN   SVT (supraventricular tachycardia) (HCC)    Tubular adenoma of colon    Past Surgical History:  Procedure Laterality Date   ATRIAL FIBRILLATION ABLATION N/A 11/17/2022   Procedure: ATRIAL FIBRILLATION  ABLATION; Location: [facility]; Surgeon: Sheran Luz, MD   BICEPT TENODESIS Right 09/27/2021   Procedure: Right reverse shoulder arthroplasty, biceps tenodesis;  Surgeon: Signa Kell, MD;  Location: [facility];  Service: Orthopedics;  Laterality: Right;   CARDIOVERSION N/A 05/15/2019   Procedure: CARDIOVERSION;  Surgeon: Antonieta Iba, MD;  Location: [facility];  Service: Cardiovascular;  Laterality: N/A;   CARDIOVERSION N/A 03/13/2014   Procedure: CARDIOVERSION; Location: [facility]; Surgeon: [name], MD   CARDIOVERSION N/A 03/24/2022   Procedure: CARDIOVERSION;  Surgeon: Antonieta Iba, MD;  Location: [facility];  Service: Cardiovascular;  Laterality: N/A;   CARDIOVERSION N/A 05/19/2022   Procedure: CARDIOVERSION;  Surgeon: Antonieta Iba, MD;  Location: [facility];  Service: Cardiovascular;  Laterality: N/A;    CARDIOVERSION N/A 07/25/2022   Procedure: CARDIOVERSION;  Surgeon: Duke Salvia, MD;  Location: [facility];  Service: Cardiovascular;  Laterality: N/A;   CARDIOVERSION N/A 09/21/2022   Procedure: CARDIOVERSION;  Surgeon: Duke Salvia, MD;  Location: [facility];  Service: Cardiovascular;  Laterality: N/A;   CARPAL TUNNEL RELEASE Right 12/23/2014   Procedure: CARPAL TUNNEL RELEASE;  Surgeon: Deeann Saint, MD;  Location: [facility];  Service: Orthopedics;  Laterality: Right;   CARPAL TUNNEL RELEASE Left 01/06/2015   Procedure: CARPAL TUNNEL RELEASE;  Surgeon: Deeann Saint, MD;  Location: [facility];  Service: Orthopedics;  Laterality: Left;   COLONOSCOPY WITH PROPOFOL N/A 10/08/2017   Procedure: COLONOSCOPY WITH PROPOFOL;  Surgeon: Toney Reil, MD;  Location: [facility];  Service: Gastroenterology;  Laterality: N/A;   COLONOSCOPY WITH PROPOFOL N/A 06/07/2022   Procedure: COLONOSCOPY WITH PROPOFOL;  Surgeon: Toney Reil, MD;  Location: [facility];  Service: Gastroenterology;  Laterality: N/A;   ENTEROSCOPY  06/07/2022   Procedure: ENTEROSCOPY;  Surgeon: Toney Reil, MD;  Location: [facility]COPY;  Service: Gastroenterology;;   ESOPHAGOGASTRODUODENOSCOPY (EGD) WITH PROPOFOL N/A 06/07/2022   Procedure: ESOPHAGOGASTRODUODENOSCOPY (EGD) WITH PROPOFOL;  Surgeon: Toney Reil, MD;  Location: [facility];  Service: Gastroenterology;  Laterality: N/A;   GASTRIC BYPASS  01/17/2000   KNEE SURGERY Right    knee trauma x3   prostate seeding     REVERSE SHOULDER ARTHROPLASTY Right 09/27/2021   Procedure: Right reverse shoulder arthroplasty, biceps tenodesis;  Surgeon: Signa Kell, MD;  Location: [facility];  Service: Orthopedics;  Laterality: Right;   REVERSE SHOULDER ARTHROPLASTY Left 02/20/2023   Procedure: Left reverse shoulder arthroplasty, biceps tenodesis;  Surgeon: Signa Kell, MD;  Location: [facility];  Service: Orthopedics;  Laterality: Left;   SHOULDER  ARTHROSCOPY WITH ROTATOR CUFF REPAIR AND OPEN BICEPS TENODESIS Left 12/27/2021   Procedure: Left shoulder arthroscopic cuff repair (supraspinatus and subscapularis) with Regeneten Patch application;  Surgeon: Signa Kell, MD;  Location: [facility];  Service: Orthopedics;  Laterality: Left;   Patient Active Problem List   Diagnosis Date Noted   Symptomatic anemia 04/26/2022   Chronic pain of both knees 03/30/2022   Bilateral primary osteoarthritis of knee 03/30/2022   Shortness of breath 03/24/2022   Vitamin D deficiency 02/24/2022   S/p reverse total shoulder arthroplasty 09/27/2021   Lesion of bone of lumbosacral spine (L5) 05/12/2021   Localized osteoarthritis of shoulder regions, bilateral 03/29/2021   Chronic pain of both shoulders 03/29/2021   Drug-induced myopathy 02/21/2021   Trigger finger of right hand 11/15/2020   Prostate cancer (HCC) 07/26/2020   Insomnia 08/01/2019   Primary osteoarthritis of both wrists 02/17/2019   Trigger middle finger of left hand 02/17/2019   Chronic pain syndrome 02/17/2019  Chronic radicular lumbar pain 10/08/2018   Lumbar radiculopathy 10/08/2018   Lumbar degenerative disc disease 10/08/2018   Lumbar facet arthropathy 10/08/2018   Lumbar facet joint syndrome 10/08/2018   Intervertebral disc disorder with radiculopathy of lumbosacral region    Osteoarthritis of knee 11/30/2017   Osteoarthritis of wrist 11/30/2017   Pernicious anemia 05/22/2016   Persistent atrial fibrillation (HCC) 12/20/2015   Carpal tunnel syndrome 11/16/2014   DJD (degenerative joint disease) of knee 10/27/2014   Bilateral carpal tunnel syndrome 10/27/2014   Morbid obesity with BMI of 45.0-49.9, adult (HCC) 10/27/2014   Mixed hyperlipidemia 10/27/2014   H/O gastric bypass 03/20/2014   Hyperkalemia 03/20/2014   History of prostate cancer 12/26/2013   Essential hypertension 12/26/2013   Encounter for anticoagulation discussion and counseling 12/26/2013    PCP: Dr.  Althea Charon  REFERRING PROVIDER: Dr. Signa Kell  REFERRING DIAG: [phone] (ICD-10-CM) - Complete rotator cuff tear or rupture of left shoulder, not specified as traumatic   RATIONALE FOR EVALUATION AND TREATMENT: Rehabilitation  THERAPY DIAG: Chronic left shoulder pain  Muscle weakness (generalized)  ONSET DATE: 02/20/23  FOLLOW-UP APPT SCHEDULED WITH REFERRING PROVIDER: Yes   FROM INITIAL EVALUATION SUBJECTIVE:                                                                                                                                                                                         SUBJECTIVE STATEMENT:  L reverse TSR 02/20/23  PERTINENT HISTORY:  DERELL BRUUN is a 68 y.o. male who initially underwent arthroscopic subscapularis repair and side-to-side supraspinatus repair with Regeneten patch application by Dr. Allena Katz 12/27/2021. He underwent post-surgical physical therapy with initial minimal improvement followed by worsening shoulder strength/function. Repeat MRI showed large, retracted subscapularis re-tear and supraspinatus re-tear with signs of rotator cuff arthropathy. Conservative measures including medications, physical therapy, and cortisone injections did not provide adequate relief. The patient elected to proceed with reverse shoulder arthroplasty and biceps tenodesis on 02/20/23. Intraoperative findings include retracted full-thickness subscapularis tear, partial-thickness supraspinatus tear, and biceps tendinopathy. Surgery was a standard deltopectoral incision under general anesthesia along with an interscalene block. Per op note, operative arm to remain in sling at all times except ROM exercises and hygiene. Can perform pendulums, elbow/wrist/hand ROM exercises. Passive ROM allowed to 90 FF and 30 ER. ASA 325mg  x 6 weeks for DVT ppx (pt states that he was actually instructed to start back on his Xarelto and not to use ASA). Plan for PT starting on POD #3-4. Pt denies any  post-operative complications from surgery. Pt has a history of chronic back pain and has undergone multiple lumbar facet, medial branch radiofrequency ablations.  He also has a past history of cardiomyopathy (in setting of Afib), DJD (degenerative joint disease) of knee, kidney stones, lower extremity edema, PAF (paroxysmal atrial fibrillation), pernicious anemia, prostate cancer, and a pulmonary nodule. He also has a past surgical history that includes R reverse TSR, knee surgery, gastric bypass (2002), prostate seeding, carpal tunnel release (Right, 12/23/2014; Left, 01/06/2015), and multiple cardioversions.   PAIN:  Pain Intensity: No resting pain upon arrival Pain location: L shoulder Pain Quality: "Nerve pain" Radiating: No  Numbness/Tingling: No Focal Weakness: Yes 24-hour pain behavior: Varies History of prior shoulder or neck/shoulder injury, pain, surgery, or therapy: Yes PT for L shoulder s/p RTC repair, history of R reverse TSR; Dominant hand: right Red flags Positive: history of prostate CA, Negative: chills/fever, night sweats, nausea, vomiting, unrelenting pain, unexplained weight gain/loss  PRECAUTIONS: Shoulder, per operative note: operative arm to remain in sling at all times except ROM exercises and hygiene. Can perform pendulums, elbow/wrist/hand ROM exercises. Passive ROM allowed to 90 FF and 30 ER.   General reverse TSR precautions include no combined shoulder extension, IR, and adduction. No shoulder and elbow AROM (bicep tenodesis) for at least the first 4 weeks   WEIGHT BEARING RESTRICTIONS: Yes no WB through LUE  FALLS: Has patient fallen in last 6 months? 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  Recent memory is intact.  Remote memory is intact.  Attention span and concentration are intact.  Expressive speech is intact.  Patient's fund of knowledge is within normal limits for educational level.    Gross Musculoskeletal Assessment Tremor: None Bulk: Normal Tone: Normal Incision is clean and dry with minimal blood staining on honeycomb dressing as well as minimal swelling around the incision. Skin is cool to touch and no signs of infection present.   Gait Deferred full gait assessment  Posture Forward head  with upper thoracic kyphosis and rounded shoulders  Cervical Screen Deferred  ROM ROM (Normal range in degrees)    Right (AROM) Left (PROM)  Shoulder    Flexion 160 90 (limited per protocol)  Extension    Abduction 180 90 (scaption)  External Rotation 88 0  Internal Rotation 70 stomach  Hands Behind Head    Hands Behind Back        Elbow    Flexion Full Full  Extension 0 0  Pronation WNL Full palm up  Supination WNL Full palm down  (* = pain; Blank rows = not tested)  UE MMT: MMT (out of 5) Right Left   Shoulder   Flexion 5   Extension    Abduction 5   External rotation 5   Internal rotation 5   Horizontal abduction    Horizontal adduction    Lower Trapezius    Rhomboids        Elbow  Flexion 5   Extension 5   Pronation 5   Supination 5  Wrist  Flexion 5 5  Extension 5 5  Radial deviation  5  Ulnar deviation  5      MCP  Flexion 5 5  Extension 5 5  Abduction 5 5  Adduction 5 5  (* = pain; Blank rows = not  tested)  Sensation Deferred  Reflexes Deferred  Palpation No tenderness to light palpation around entire L shoulder girdle;  Passive Accessory Intervertebral Motion Deferred  Accessory Motions/Glides Deferred  Muscle Length Testing Deferred  SPECIAL TESTS Deferred  Beighton scale Deferred   TODAY'S TREATMENT: 04/09/23  Subjective: Patient reports no pain on arrival. He has continued weaning himself out of the sling per surgeons recommendations. He reports one additional episode where the shoulder felt like it might have partially subluxed. Per pt he is able to perform L shoulder AROM but has lifting restrictions (he believes 5#). No specific questions upon arrival.     Ther-ex  In semi-recumbent position: PROM in L shoulder Flex/ABD/ER within pain-free range as protocol allows; PROM L elbow flexion/extension and forearm pronation/supination; AAROM with dowel for L shoulder flexion and abduction; L shoulder isometric abduction, adduction, IR, ER, flexion, and extension x 10 each with shoulder in neutral position; L elbow AROM flexion and manually resisted extension x 10; L forearm pronation/supination with gentle manual resistance x 10 each;  In seated position: Scapular retractions 2 x 10; L shoulder AROM flexion and abduction x 10 each;   HOME EXERCISE PROGRAM:  Access Code: [email or web address] URL: [email or web address] Date: 02/28/2023 Prepared by: Ria Comment  Exercises - Seated Cervical Sidebending Stretch (Mirrored)  - 2 x daily - 7 x weekly - 3 reps - 30s hold - Seated Scapular Retraction  - 2 x daily - 7 x weekly - 2 sets - 10 reps - 3s hold - Circular Shoulder Pendulum with Table Support  - 2 x daily - 7 x weekly - 3 reps - 60s hold - Horizontal Shoulder Pendulum with Table Support  - 2 x daily - 7 x weekly - 3 reps - 60s hold - Flexion-Extension Shoulder Pendulum with Table Support (Mirrored)  - 2 x daily - 7 x weekly - 3 reps - 60s hold -  Wrist Flexion AROM  - 2 x daily - 7 x weekly - 2 sets - 10 reps - 3s hold - Wrist Extension AROM  - 1 x daily - 7 x weekly - 2 sets - 10 reps - 3s hold    ASSESSMENT:  Clinical Impression:   No notable pain reported during therapy session today. Focused on AAROM, AROM, and isometric strengthening. Pt encouraged to continue HEP and follow up as directed. He will continue to benefit from skilled therapy to address remaining deficits in order to improve quality of life and return to PLOF.    OBJECTIVE IMPAIRMENTS: decreased ROM, decreased strength, and pain.   ACTIVITY LIMITATIONS: carrying, lifting, bathing, toileting, dressing, and reach over head  PARTICIPATION LIMITATIONS: meal prep, cleaning, laundry, driving, shopping, community activity, and yard work  PERSONAL FACTORS: Past/current experiences, Time since onset of injury/illness/exacerbation, and 3+ comorbidities: OA, anemia, anxiety, cardiomyopathy, chronic pain  are also affecting patient's functional outcome.   REHAB POTENTIAL: Good  CLINICAL DECISION MAKING: Unstable/unpredictable  EVALUATION COMPLEXITY: High   GOALS: Goals reviewed with patient? Yes  SHORT TERM GOALS: Target date: 04/09/2023  Pt will be independent with HEP to improve strength and decrease shoulder pain to improve pain-free function at home and work. Baseline:  Goal status: INITIAL   LONG  TERM GOALS: Target date: 05/21/2023  1.  Pt will report no further L shoulder pain with all AROM in order to complete all ADLs and improve pain-free function. Baseline:  Goal status: INITIAL  2.  Pt will decrease QuickDASH score by at least 8% in order to demonstrate clinically significant reduction in disability related to shoulder pain        Baseline: 02/26/23: 72.7% Goal status: INITIAL  3. Pt will increase strength of L shoulder flexion and scaption to at least 4/5 in order to demonstrate improvement in strength and function         Baseline: 02/26/23: Unable  to test Goal status: INITIAL  4. Pt will improve L shoulder AROM flexion and scaption to at least 110 degrees in order to demonstrate improvement in function so he can take care of his property and complete all household responsibilities;        Baseline: 02/26/23: Unable to test (90 degrees PROM for both) Goal status: INITIAL   PLAN: PT FREQUENCY: 1-2x/week  PT DURATION: 12 weeks  PLANNED INTERVENTIONS: Therapeutic exercises, Therapeutic activity, Neuromuscular re-education, Balance training, Gait training, Patient/Family education, Self Care, Joint mobilization, Joint manipulation, Vestibular training, Canalith repositioning, Orthotic/Fit training, DME instructions, Dry Needling, Electrical stimulation, Spinal manipulation, Spinal mobilization, Cryotherapy, Moist heat, Taping, Traction, Ultrasound, Ionotophoresis 4mg /ml Dexamethasone, Manual therapy, and Re-evaluation.  PLAN FOR NEXT SESSION: Sensation testing, progress L shoulder per surgical protocol;  Lynnea Maizes PT, DPT, GCS  Physical Therapist - [place]  [place] 4:34 PM,04/09/23

## 2023-04-11 ENCOUNTER — Ambulatory Visit: Payer: Medicare Other

## 2023-04-14 NOTE — Therapy (Incomplete)
 OUTPATIENT PHYSICAL THERAPY SHOULDER TREATMENT  Patient Name: [name] MRN: [id number] DOB:10-Jul-1955, 68 y.o., male Today's Date: 04/14/2023    Past Medical History:  Diagnosis Date   (HFpEF) heart failure with preserved ejection fraction (HCC)    a.) TTE 12/26/2013: EF 45-50%, mild ant and antsept HK. mild MR. Mod dil LA. nl RV fxn. Rhythm was Afib; b.) TTE 07/04/2019: EF 55%, mid-apical anteroseptal HK, mild MAC, mild Ao sclerosis, G2DD, RVSP 45.3; c.) TTE 06/02/2022: EF 55-60%, no RWMAs, sev LAE, RVSP 37.9, mild-mod MR, mild AR, AoV sclerosis without stenosis, asc Ao 38 mm   Anxiety    Aortic atherosclerosis (HCC)    Cardiomyopathy (in setting of Afib)    a.) TTE 12/26/2013: EF 45-50%; b.) TTE 07/04/2019: EF 55%; c.) TTE 06/02/2022: EF 55-60%   Chronic pain syndrome    a.) followed by pain management   Chronic, continuous use of opioids    a.) on COT; followed by pain management   Coronary artery disease 11/06/2014   a.) cCTA 11/06/2014: Ca2+ = 224 (74th %ile; LAD distribution)   Diverticulosis    DJD (degenerative joint disease) of knee    Former smoker    History of hiatal hernia    History of kidney stones 2012   Hyperlipidemia    Hyperplastic colon polyp    Hypertension    Inguinal hernia, left    Internal hemorrhoids    Intervertebral disc disorder with radiculopathy of lumbosacral region    Lesion of bone of lumbosacral spine    L5   Long term current use of amiodarone    Lower extremity edema    Mixed hyperlipidemia    Morbid obesity (HCC)    Multiple falls    Myalgia due to statin    On rivaroxaban therapy    Osteoarthritis    PAF (paroxysmal atrial fibrillation) (HCC)    a.) CHA2DS2VASc = 4 (age, HTN, CHF, vascular disease) as of 02/19/23;  b.) s/p DCCV 03/13/14 (200 J), 05/15/19 (150 J x 1, 200 J x2), 05/19/2022 (150 J x1, 200 J x2; pads changed to ant/lat postion with additional 200 J x 2 -> did not convert), 07/25/22 (200 J), 09/21/2022 (200 J); c.) s/p  PVI ablation 11/17/2022; d.) rate/rhythm maintained on amiodarone; chronically anticoagulated on rivaroxaban   Pernicious anemia    Prostate cancer (HCC)    Pulmonary nodule, right    a. 10/2014 Cardiac CTA: 7mm RLL nodule; b. 04/2015 CT Chest: stable 7mm RLL nodule. No new nodules; 02/2017 CTA Chest: stable, benign, 7mm RLL pulm nodule.   Rotator cuff tear, left    Sleep difficulties    a.) takes melatonin + trazodone PRN   SVT (supraventricular tachycardia) (HCC)    Tubular adenoma of colon    Past Surgical History:  Procedure Laterality Date   ATRIAL FIBRILLATION ABLATION N/A 11/17/2022   Procedure: ATRIAL FIBRILLATION ABLATION; Location: [facility]; Surgeon: Sheran Luz, MD   BICEPT TENODESIS Right 09/27/2021   Procedure: Right reverse shoulder arthroplasty, biceps tenodesis;  Surgeon: Signa Kell, MD;  Location: [facility];  Service: Orthopedics;  Laterality: Right;   CARDIOVERSION N/A 05/15/2019   Procedure: CARDIOVERSION;  Surgeon: Antonieta Iba, MD;  Location: [facility];  Service: Cardiovascular;  Laterality: N/A;   CARDIOVERSION N/A 03/13/2014   Procedure: CARDIOVERSION; Location: [facility]; Surgeon: [name], MD   CARDIOVERSION N/A 03/24/2022   Procedure: CARDIOVERSION;  Surgeon: Antonieta Iba, MD;  Location: [facility];  Service: Cardiovascular;  Laterality: N/A;   CARDIOVERSION N/A  05/19/2022   Procedure: CARDIOVERSION;  Surgeon: Antonieta Iba, MD;  Location: [facility];  Service: Cardiovascular;  Laterality: N/A;   CARDIOVERSION N/A 07/25/2022   Procedure: CARDIOVERSION;  Surgeon: Duke Salvia, MD;  Location: [facility];  Service: Cardiovascular;  Laterality: N/A;   CARDIOVERSION N/A 09/21/2022   Procedure: CARDIOVERSION;  Surgeon: Duke Salvia, MD;  Location: [facility];  Service: Cardiovascular;  Laterality: N/A;   CARPAL TUNNEL RELEASE Right 12/23/2014   Procedure: CARPAL TUNNEL RELEASE;  Surgeon: Deeann Saint, MD;  Location: [facility];  Service: Orthopedics;  Laterality:  Right;   CARPAL TUNNEL RELEASE Left 01/06/2015   Procedure: CARPAL TUNNEL RELEASE;  Surgeon: Deeann Saint, MD;  Location: [facility];  Service: Orthopedics;  Laterality: Left;   COLONOSCOPY WITH PROPOFOL N/A 10/08/2017   Procedure: COLONOSCOPY WITH PROPOFOL;  Surgeon: Toney Reil, MD;  Location: [facility];  Service: Gastroenterology;  Laterality: N/A;   COLONOSCOPY WITH PROPOFOL N/A 06/07/2022   Procedure: COLONOSCOPY WITH PROPOFOL;  Surgeon: Toney Reil, MD;  Location: [facility] ENDOSCOPY;  Service: Gastroenterology;  Laterality: N/A;   ENTEROSCOPY  06/07/2022   Procedure: ENTEROSCOPY;  Surgeon: Toney Reil, MD;  Location: [facility]OSCOPY;  Service: Gastroenterology;;   ESOPHAGOGASTRODUODENOSCOPY (EGD) WITH PROPOFOL N/A 06/07/2022   Procedure: ESOPHAGOGASTRODUODENOSCOPY (EGD) WITH PROPOFOL;  Surgeon: Toney Reil, MD;  Location[facility];  Service: Gastroenterology;  Laterality: N/A;   GASTRIC BYPASS  01/17/2000   KNEE SURGERY Right    knee trauma x3   prostate seeding     REVERSE SHOULDER ARTHROPLASTY Right 09/27/2021   Procedure: Right reverse shoulder arthroplasty, biceps tenodesis;  Surgeon: Signa Kell, MD;  Location: [facility];  Service: Orthopedics;  Laterality: Right;   REVERSE SHOULDER ARTHROPLASTY Left 02/20/2023   Procedure: Left reverse shoulder arthroplasty, biceps tenodesis;  Surgeon: Signa Kell, MD;  Location: [facility];  Service: Orthopedics;  Laterality: Left;   SHOULDER ARTHROSCOPY WITH ROTATOR CUFF REPAIR AND OPEN BICEPS TENODESIS Left 12/27/2021   Procedure: Left shoulder arthroscopic cuff repair (supraspinatus and subscapularis) with Regeneten Patch application;  Surgeon: Signa Kell, MD;  Location: [facility];  Service: Orthopedics;  Laterality: Left;   Patient Active Problem List   Diagnosis Date Noted   Symptomatic anemia 04/26/2022   Chronic pain of both knees 03/30/2022   Bilateral primary osteoarthritis of knee 03/30/2022    Shortness of breath 03/24/2022   Vitamin D deficiency 02/24/2022   S/p reverse total shoulder arthroplasty 09/27/2021   Lesion of bone of lumbosacral spine (L5) 05/12/2021   Localized osteoarthritis of shoulder regions, bilateral 03/29/2021   Chronic pain of both shoulders 03/29/2021   Drug-induced myopathy 02/21/2021   Trigger finger of right hand 11/15/2020   Prostate cancer (HCC) 07/26/2020   Insomnia 08/01/2019   Primary osteoarthritis of both wrists 02/17/2019   Trigger middle finger of left hand 02/17/2019   Chronic pain syndrome 02/17/2019   Chronic radicular lumbar pain 10/08/2018   Lumbar radiculopathy 10/08/2018   Lumbar degenerative disc disease 10/08/2018   Lumbar facet arthropathy 10/08/2018   Lumbar facet joint syndrome 10/08/2018   Intervertebral disc disorder with radiculopathy of lumbosacral region    Osteoarthritis of knee 11/30/2017   Osteoarthritis of wrist 11/30/2017   Pernicious anemia 05/22/2016   Persistent atrial fibrillation (HCC) 12/20/2015   Carpal tunnel syndrome 11/16/2014   DJD (degenerative joint disease) of knee 10/27/2014   Bilateral carpal tunnel syndrome 10/27/2014   Morbid obesity with BMI of 45.0-49.9, adult (HCC) 10/27/2014   Mixed hyperlipidemia 10/27/2014  H/O gastric bypass 03/20/2014   Hyperkalemia 03/20/2014   History of prostate cancer 12/26/2013   Essential hypertension 12/26/2013   Encounter for anticoagulation discussion and counseling 12/26/2013    PCP: Dr. Althea Charon  REFERRING PROVIDER: Dr. Signa Kell  REFERRING DIAG: [phone] (ICD-10-CM) - Complete rotator cuff tear or rupture of left shoulder, not specified as traumatic   RATIONALE FOR EVALUATION AND TREATMENT: Rehabilitation  THERAPY DIAG: Chronic left shoulder pain  Muscle weakness (generalized)  ONSET DATE: 02/20/23  FOLLOW-UP APPT SCHEDULED WITH REFERRING PROVIDER: Yes   FROM INITIAL EVALUATION SUBJECTIVE:                                                                                                                                                                                          SUBJECTIVE STATEMENT:  L reverse TSR 02/20/23  PERTINENT HISTORY:  JOSHUWA VECCHIO is a 68 y.o. male who initially underwent arthroscopic subscapularis repair and side-to-side supraspinatus repair with Regeneten patch application by Dr. Allena Katz 12/27/2021. He underwent post-surgical physical therapy with initial minimal improvement followed by worsening shoulder strength/function. Repeat MRI showed large, retracted subscapularis re-tear and supraspinatus re-tear with signs of rotator cuff arthropathy. Conservative measures including medications, physical therapy, and cortisone injections did not provide adequate relief. The patient elected to proceed with reverse shoulder arthroplasty and biceps tenodesis on 02/20/23. Intraoperative findings include retracted full-thickness subscapularis tear, partial-thickness supraspinatus tear, and biceps tendinopathy. Surgery was a standard deltopectoral incision under general anesthesia along with an interscalene block. Per op note, operative arm to remain in sling at all times except ROM exercises and hygiene. Can perform pendulums, elbow/wrist/hand ROM exercises. Passive ROM allowed to 90 FF and 30 ER. ASA 325mg  x 6 weeks for DVT ppx (pt states that he was actually instructed to start back on his Xarelto and not to use ASA). Plan for PT starting on POD #3-4. Pt denies any post-operative complications from surgery. Pt has a history of chronic back pain and has undergone multiple lumbar facet, medial branch radiofrequency ablations. He also has a past history of cardiomyopathy (in setting of Afib), DJD (degenerative joint disease) of knee, kidney stones, lower extremity edema, PAF (paroxysmal atrial fibrillation), pernicious anemia, prostate cancer, and a pulmonary nodule. He also has a past surgical history that includes R reverse TSR, knee surgery,  gastric bypass (2002), prostate seeding, carpal tunnel release (Right, 12/23/2014; Left, 01/06/2015), and multiple cardioversions.   PAIN:  Pain Intensity: No resting pain upon arrival Pain location: L shoulder Pain Quality: "Nerve pain" Radiating: No  Numbness/Tingling: No Focal Weakness: Yes 24-hour pain behavior: Varies History of prior shoulder or neck/shoulder injury, pain, surgery, or  therapy: Yes PT for L shoulder s/p RTC repair, history of R reverse TSR; Dominant hand: right Red flags Positive: history of prostate CA, Negative: chills/fever, night sweats, nausea, vomiting, unrelenting pain, unexplained weight gain/loss  PRECAUTIONS: Shoulder, per operative note: operative arm to remain in sling at all times except ROM exercises and hygiene. Can perform pendulums, elbow/wrist/hand ROM exercises. Passive ROM allowed to 90 FF and 30 ER.   General reverse TSR precautions include no combined shoulder extension, IR, and adduction. No shoulder and elbow AROM (bicep tenodesis) for at least the first 4 weeks   WEIGHT BEARING RESTRICTIONS: Yes no WB through LUE  FALLS: Has patient fallen in last 6 months? 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  Recent memory is intact.  Remote memory is intact.  Attention span and concentration are intact.   Expressive speech is intact.  Patient's fund of knowledge is within normal limits for educational level.    Gross Musculoskeletal Assessment Tremor: None Bulk: Normal Tone: Normal Incision is clean and dry with minimal blood staining on honeycomb dressing as well as minimal swelling around the incision. Skin is cool to touch and no signs of infection present.   Gait Deferred full gait assessment  Posture Forward head  with upper thoracic kyphosis and rounded shoulders  Cervical Screen Deferred  ROM ROM (Normal range in degrees)    Right (AROM) Left (PROM)  Shoulder    Flexion 160 90 (limited per protocol)  Extension    Abduction 180 90 (scaption)  External Rotation 88 0  Internal Rotation 70 stomach  Hands Behind Head    Hands Behind Back        Elbow    Flexion Full Full  Extension 0 0  Pronation WNL Full palm up  Supination WNL Full palm down  (* = pain; Blank rows = not tested)  UE MMT: MMT (out of 5) Right Left   Shoulder   Flexion 5   Extension    Abduction 5   External rotation 5   Internal rotation 5   Horizontal abduction    Horizontal adduction    Lower Trapezius    Rhomboids        Elbow  Flexion 5   Extension 5   Pronation 5   Supination 5       Wrist  Flexion 5 5  Extension 5 5  Radial deviation  5  Ulnar deviation  5      MCP  Flexion 5 5  Extension 5 5  Abduction 5 5  Adduction 5 5  (* = pain; Blank rows = not tested)  Sensation Deferred  Reflexes Deferred  Palpation No tenderness to light palpation around entire L shoulder girdle;  Passive Accessory Intervertebral Motion Deferred  Accessory Motions/Glides Deferred  Muscle Length Testing Deferred  SPECIAL TESTS Deferred  Beighton scale Deferred   TODAY'S TREATMENT: 04/14/23  Subjective: Patient reports no pain on arrival.  He has continued weaning himself out of the sling per surgeons recommendations. He reports one additional episode where the shoulder  felt like it might have partially subluxed. Per pt he is able to perform L shoulder AROM but has lifting restrictions (he believes 5#). No specific questions upon arrival.     Ther-ex  In semi-recumbent position: PROM in L shoulder Flex/ABD/ER within pain-free range as protocol allows; PROM L elbow flexion/extension and forearm pronation/supination; AAROM with dowel for L shoulder flexion and abduction; L shoulder isometric abduction, adduction, IR, ER, flexion, and extension x 10 each with shoulder in neutral position; L elbow AROM flexion and manually resisted extension x 10; L forearm pronation/supination with gentle manual resistance x 10 each;  In seated position: Scapular retractions 2 x 10; L shoulder AROM flexion and abduction x 10 each;   HOME EXERCISE PROGRAM:  Access Code: [email or web address] URL: [email or web address] Date: 02/28/2023 Prepared by: Ria Comment  Exercises - Seated Cervical Sidebending Stretch (Mirrored)  - 2 x daily - 7 x weekly - 3 reps - 30s hold - Seated Scapular Retraction  - 2 x daily - 7 x weekly - 2 sets - 10 reps - 3s hold - Circular Shoulder Pendulum with Table Support  - 2 x daily - 7 x weekly - 3 reps - 60s hold - Horizontal Shoulder Pendulum with Table Support  - 2 x daily - 7 x weekly - 3 reps - 60s hold - Flexion-Extension Shoulder Pendulum with Table Support (Mirrored)  - 2 x daily - 7 x weekly - 3 reps - 60s hold - Wrist Flexion AROM  - 2 x daily - 7 x weekly - 2 sets - 10 reps - 3s hold - Wrist Extension AROM  - 1 x daily - 7 x weekly - 2 sets - 10 reps - 3s hold    ASSESSMENT:  Clinical Impression:   No notable pain reported during therapy session today. Focused on AAROM, AROM, and isometric strengthening. Pt encouraged to continue HEP and follow up as directed. He will continue to benefit from skilled therapy to address remaining deficits in order to improve quality of life and return to PLOF.    OBJECTIVE IMPAIRMENTS: decreased  ROM, decreased strength, and pain.   ACTIVITY LIMITATIONS: carrying, lifting, bathing, toileting, dressing, and reach over head  PARTICIPATION LIMITATIONS: meal prep, cleaning, laundry, driving, shopping, community activity, and yard work  PERSONAL FACTORS: Past/current experiences, Time since onset of injury/illness/exacerbation, and 3+ comorbidities: OA, anemia, anxiety, cardiomyopathy, chronic pain  are also affecting patient's functional outcome.   REHAB POTENTIAL: Good  CLINICAL DECISION MAKING: Unstable/unpredictable  EVALUATION COMPLEXITY: High   GOALS: Goals reviewed with patient? Yes  SHORT TERM GOALS: Target date: 04/09/2023  Pt will be independent with HEP to improve strength and decrease shoulder pain to improve pain-free function at home and work. Baseline:  Goal status: INITIAL   LONG TERM GOALS: Target date: 05/21/2023  1.  Pt will report no further L shoulder pain with all AROM in order to complete all ADLs and improve pain-free function. Baseline:  Goal status: INITIAL  2.  Pt will decrease QuickDASH score by at least 8% in order to demonstrate clinically significant reduction in disability related to shoulder pain        Baseline: 02/26/23: 72.7% Goal status: INITIAL  3. Pt will increase strength of L shoulder flexion and scaption to at least 4/5 in order to demonstrate improvement in strength and function  Baseline: 02/26/23: Unable to test Goal status: INITIAL  4. Pt will improve L shoulder AROM flexion and scaption to at least 110 degrees in order to demonstrate improvement in function so he can take care of his property and complete all household responsibilities;        Baseline: 02/26/23: Unable to test (90 degrees PROM for both) Goal status: INITIAL   PLAN: PT FREQUENCY: 1-2x/week  PT DURATION: 12 weeks  PLANNED INTERVENTIONS: Therapeutic exercises, Therapeutic activity, Neuromuscular re-education, Balance training, Gait training,  Patient/Family education, Self Care, Joint mobilization, Joint manipulation, Vestibular training, Canalith repositioning, Orthotic/Fit training, DME instructions, Dry Needling, Electrical stimulation, Spinal manipulation, Spinal mobilization, Cryotherapy, Moist heat, Taping, Traction, Ultrasound, Ionotophoresis 4mg /ml Dexamethasone, Manual therapy, and Re-evaluation.  PLAN FOR NEXT SESSION: Sensation testing, progress L shoulder per surgical protocol;  Lynnea Maizes PT, DPT, GCS  Physical Therapist - [place]  [facility] 8:19 PM,04/14/23

## 2023-04-15 ENCOUNTER — Other Ambulatory Visit: Payer: Self-pay | Admitting: Family Medicine

## 2023-04-15 ENCOUNTER — Other Ambulatory Visit: Payer: Self-pay | Admitting: Cardiovascular Disease

## 2023-04-15 DIAGNOSIS — G4700 Insomnia, unspecified: Secondary | ICD-10-CM

## 2023-04-16 ENCOUNTER — Ambulatory Visit

## 2023-04-16 DIAGNOSIS — M6281 Muscle weakness (generalized): Secondary | ICD-10-CM | POA: Diagnosis not present

## 2023-04-16 DIAGNOSIS — M25512 Pain in left shoulder: Secondary | ICD-10-CM | POA: Diagnosis not present

## 2023-04-16 DIAGNOSIS — G8929 Other chronic pain: Secondary | ICD-10-CM | POA: Diagnosis not present

## 2023-04-16 DIAGNOSIS — R293 Abnormal posture: Secondary | ICD-10-CM | POA: Diagnosis not present

## 2023-04-16 NOTE — Telephone Encounter (Signed)
 Prescription refill request for Xarelto received.  Indication:afib Last office visit:1/25 Weight:157.4  kg Age:68 Scr:1.0  2/25 CrCl:155.21  ml/min  Prescription refilled

## 2023-04-16 NOTE — Therapy (Signed)
 OUTPATIENT PHYSICAL THERAPY SHOULDER TREATMENT  Patient Name: [name] MRN: [id number] DOB:[birth year], 68 y.o., male Today's Date: 04/16/2023   PT End of Session - 04/16/23 1502     Visit Number 6    Number of Visits 25    Date for PT Re-Evaluation 05/21/23    Authorization Type eval: 02/26/23, [employer] Medicare, [id number] on AUTH    Authorization Time Period [facility] medicare 2024  [id number] on MN    PT Start Time 1400    PT Stop Time 1442    PT Time Calculation (min) 42 min    Activity Tolerance Patient tolerated treatment well    Behavior During Therapy WFL for tasks assessed/performed             Past Medical History:  Diagnosis Date   (HFpEF) heart failure with preserved ejection fraction (HCC)    a.) TTE 12/26/2013: EF 45-50%, mild ant and antsept HK. mild MR. Mod dil LA. nl RV fxn. Rhythm was Afib; b.) TTE 07/04/2019: EF 55%, mid-apical anteroseptal HK, mild MAC, mild Ao sclerosis, G2DD, RVSP 45.3; c.) TTE 06/02/2022: EF 55-60%, no RWMAs, sev LAE, RVSP 37.9, mild-mod MR, mild AR, AoV sclerosis without stenosis, asc Ao 38 mm   Anxiety    Aortic atherosclerosis (HCC)    Cardiomyopathy (in setting of Afib)    a.) TTE 12/26/2013: EF 45-50%; b.) TTE 07/04/2019: EF 55%; c.) TTE 06/02/2022: EF 55-60%   Chronic pain syndrome    a.) followed by pain management   Chronic, continuous use of opioids    a.) on COT; followed by pain management   Coronary artery disease 11/06/2014   a.) cCTA 11/06/2014: Ca2+ = 224 (74th %ile; LAD distribution)   Diverticulosis    DJD (degenerative joint disease) of knee    Former smoker    History of hiatal hernia    History of kidney stones 2012   Hyperlipidemia    Hyperplastic colon polyp    Hypertension    Inguinal hernia, left    Internal hemorrhoids    Intervertebral disc disorder with radiculopathy of lumbosacral region    Lesion of bone of lumbosacral spine    L5   Long term current use of amiodarone    Lower extremity edema    Mixed  hyperlipidemia    Morbid obesity (HCC)    Multiple falls    Myalgia due to statin    On rivaroxaban therapy    Osteoarthritis    PAF (paroxysmal atrial fibrillation) (HCC)    a.) CHA2DS2VASc = 4 (age, HTN, CHF, vascular disease) as of 02/19/23;  b.) s/p DCCV 03/13/14 (200 J), 05/15/19 (150 J x 1, 200 J x2), 05/19/2022 (150 J x1, 200 J x2; pads changed to ant/lat postion with additional 200 J x 2 -> did not convert), 07/25/22 (200 J), 09/21/2022 (200 J); c.) s/p PVI ablation 11/17/2022; d.) rate/rhythm maintained on amiodarone; chronically anticoagulated on rivaroxaban   Pernicious anemia    Prostate cancer (HCC)    Pulmonary nodule, right    a. 10/2014 Cardiac CTA: 7mm RLL nodule; b. 04/2015 CT Chest: stable 7mm RLL nodule. No new nodules; 02/2017 CTA Chest: stable, benign, 7mm RLL pulm nodule.   Rotator cuff tear, left    Sleep difficulties    a.) takes melatonin + trazodone PRN   SVT (supraventricular tachycardia) (HCC)    Tubular adenoma of colon    Past Surgical History:  Procedure Laterality Date   ATRIAL FIBRILLATION ABLATION N/A 11/17/2022   Procedure: ATRIAL  FIBRILLATION ABLATION; Location: [facility]; Surgeon: Sheran Luz, MD   BICEPT TENODESIS Right 09/27/2021   Procedure: Right reverse shoulder arthroplasty, biceps tenodesis;  Surgeon: Signa Kell, MD;  Location: [facility];  Service: Orthopedics;  Laterality: Right;   CARDIOVERSION N/A 05/15/2019   Procedure: CARDIOVERSION;  Surgeon: Antonieta Iba, MD;  Location: [facility];  Service: Cardiovascular;  Laterality: N/A;   CARDIOVERSION N/A 03/13/2014   Procedure: CARDIOVERSION; Location: [facility]; Surgeon: [name], MD   CARDIOVERSION N/A 03/24/2022   Procedure: CARDIOVERSION;  Surgeon: Antonieta Iba, MD;  Location: [facility];  Service: Cardiovascular;  Laterality: N/A;   CARDIOVERSION N/A 05/19/2022   Procedure: CARDIOVERSION;  Surgeon: Antonieta Iba, MD;  Location: [facility];  Service: Cardiovascular;  Laterality: N/A;    CARDIOVERSION N/A 07/25/2022   Procedure: CARDIOVERSION;  Surgeon: Duke Salvia, MD;  Location: [facility];  Service: Cardiovascular;  Laterality: N/A;   CARDIOVERSION N/A 09/21/2022   Procedure: CARDIOVERSION;  Surgeon: Duke Salvia, MD;  Location: [facility];  Service: Cardiovascular;  Laterality: N/A;   CARPAL TUNNEL RELEASE Right 12/23/2014   Procedure: CARPAL TUNNEL RELEASE;  Surgeon: Deeann Saint, MD;  Location: [facility];  Service: Orthopedics;  Laterality: Right;   CARPAL TUNNEL RELEASE Left 01/06/2015   Procedure: CARPAL TUNNEL RELEASE;  Surgeon: Deeann Saint, MD;  Location: [facility];  Service: Orthopedics;  Laterality: Left;   COLONOSCOPY WITH PROPOFOL N/A 10/08/2017   Procedure: COLONOSCOPY WITH PROPOFOL;  Surgeon: Toney Reil, MD;  Location: [facility];  Service: Gastroenterology;  Laterality: N/A;   COLONOSCOPY WITH PROPOFOL N/A 06/07/2022   Procedure: COLONOSCOPY WITH PROPOFOL;  Surgeon: Toney Reil, MD;  Location: [facility] ENDOSCOPY;  Service: Gastroenterology;  Laterality: N/A;   ENTEROSCOPY  06/07/2022   Procedure: ENTEROSCOPY;  Surgeon: Toney Reil, MD;  Location: [facility] ENDOSCOPY;  Service: Gastroenterology;;   ESOPHAGOGASTRODUODENOSCOPY (EGD) WITH PROPOFOL N/A 06/07/2022   Procedure: ESOPHAGOGASTRODUODENOSCOPY (EGD) WITH PROPOFOL;  Surgeon: Toney Reil, MD;  Location: [facility] ENDOSCOPY;  Service: Gastroenterology;  Laterality: N/A;   GASTRIC BYPASS  01/17/2000   KNEE SURGERY Right    knee trauma x3   prostate seeding     REVERSE SHOULDER ARTHROPLASTY Right 09/27/2021   Procedure: Right reverse shoulder arthroplasty, biceps tenodesis;  Surgeon: Signa Kell, MD;  Location: [facility];  Service: Orthopedics;  Laterality: Right;   REVERSE SHOULDER ARTHROPLASTY Left 02/20/2023   Procedure: Left reverse shoulder arthroplasty, biceps tenodesis;  Surgeon: Signa Kell, MD;  Location: [facility];  Service: Orthopedics;  Laterality: Left;   SHOULDER  ARTHROSCOPY WITH ROTATOR CUFF REPAIR AND OPEN BICEPS TENODESIS Left 12/27/2021   Procedure: Left shoulder arthroscopic cuff repair (supraspinatus and subscapularis) with Regeneten Patch application;  Surgeon: Signa Kell, MD;  Location: [facility];  Service: Orthopedics;  Laterality: Left;   Patient Active Problem List   Diagnosis Date Noted   Symptomatic anemia 04/26/2022   Chronic pain of both knees 03/30/2022   Bilateral primary osteoarthritis of knee 03/30/2022   Shortness of breath 03/24/2022   Vitamin D deficiency 02/24/2022   S/p reverse total shoulder arthroplasty 09/27/2021   Lesion of bone of lumbosacral spine (L5) 05/12/2021   Localized osteoarthritis of shoulder regions, bilateral 03/29/2021   Chronic pain of both shoulders 03/29/2021   Drug-induced myopathy 02/21/2021   Trigger finger of right hand 11/15/2020   Prostate cancer (HCC) 07/26/2020   Insomnia 08/01/2019   Primary osteoarthritis of both wrists 02/17/2019   Trigger middle finger of left hand 02/17/2019   Chronic pain syndrome  02/17/2019   Chronic radicular lumbar pain 10/08/2018   Lumbar radiculopathy 10/08/2018   Lumbar degenerative disc disease 10/08/2018   Lumbar facet arthropathy 10/08/2018   Lumbar facet joint syndrome 10/08/2018   Intervertebral disc disorder with radiculopathy of lumbosacral region    Osteoarthritis of knee 11/30/2017   Osteoarthritis of wrist 11/30/2017   Pernicious anemia 05/22/2016   Persistent atrial fibrillation (HCC) 12/20/2015   Carpal tunnel syndrome 11/16/2014   DJD (degenerative joint disease) of knee 10/27/2014   Bilateral carpal tunnel syndrome 10/27/2014   Morbid obesity with BMI of 45.0-49.9, adult (HCC) 10/27/2014   Mixed hyperlipidemia 10/27/2014   H/O gastric bypass 03/20/2014   Hyperkalemia 03/20/2014   History of prostate cancer 12/26/2013   Essential hypertension 12/26/2013   Encounter for anticoagulation discussion and counseling 12/26/2013    PCP: Dr.  Althea Charon  REFERRING PROVIDER: Dr. Signa Kell  REFERRING DIAG: [phone] (ICD-10-CM) - Complete rotator cuff tear or rupture of left shoulder, not specified as traumatic   RATIONALE FOR EVALUATION AND TREATMENT: Rehabilitation  THERAPY DIAG: Chronic left shoulder pain  Muscle weakness (generalized)  Abnormal posture  ONSET DATE: 02/20/23  FOLLOW-UP APPT SCHEDULED WITH REFERRING PROVIDER: Yes   FROM INITIAL EVALUATION SUBJECTIVE:                                                                                                                                                                                         SUBJECTIVE STATEMENT:  L reverse TSR 02/20/23  PERTINENT HISTORY:  DONEVIN SAINSBURY is a 68 y.o. male who initially underwent arthroscopic subscapularis repair and side-to-side supraspinatus repair with Regeneten patch application by Dr. Allena Katz 12/27/2021. He underwent post-surgical physical therapy with initial minimal improvement followed by worsening shoulder strength/function. Repeat MRI showed large, retracted subscapularis re-tear and supraspinatus re-tear with signs of rotator cuff arthropathy. Conservative measures including medications, physical therapy, and cortisone injections did not provide adequate relief. The patient elected to proceed with reverse shoulder arthroplasty and biceps tenodesis on 02/20/23. Intraoperative findings include retracted full-thickness subscapularis tear, partial-thickness supraspinatus tear, and biceps tendinopathy. Surgery was a standard deltopectoral incision under general anesthesia along with an interscalene block. Per op note, operative arm to remain in sling at all times except ROM exercises and hygiene. Can perform pendulums, elbow/wrist/hand ROM exercises. Passive ROM allowed to 90 FF and 30 ER. ASA 325mg  x 6 weeks for DVT ppx (pt states that he was actually instructed to start back on his Xarelto and not to use ASA). Plan for PT starting on POD  #3-4. Pt denies any post-operative complications from surgery. Pt has a history of chronic back pain and has undergone multiple  lumbar facet, medial branch radiofrequency ablations. He also has a past history of cardiomyopathy (in setting of Afib), DJD (degenerative joint disease) of knee, kidney stones, lower extremity edema, PAF (paroxysmal atrial fibrillation), pernicious anemia, prostate cancer, and a pulmonary nodule. He also has a past surgical history that includes R reverse TSR, knee surgery, gastric bypass (2002), prostate seeding, carpal tunnel release (Right, 12/23/2014; Left, 01/06/2015), and multiple cardioversions.   PAIN:  Pain Intensity: No resting pain upon arrival Pain location: L shoulder Pain Quality: "Nerve pain" Radiating: No  Numbness/Tingling: No Focal Weakness: Yes 24-hour pain behavior: Varies History of prior shoulder or neck/shoulder injury, pain, surgery, or therapy: Yes PT for L shoulder s/p RTC repair, history of R reverse TSR; Dominant hand: right Red flags Positive: history of prostate CA, Negative: chills/fever, night sweats, nausea, vomiting, unrelenting pain, unexplained weight gain/loss  PRECAUTIONS: Shoulder, per operative note: operative arm to remain in sling at all times except ROM exercises and hygiene. Can perform pendulums, elbow/wrist/hand ROM exercises. Passive ROM allowed to 90 FF and 30 ER.   General reverse TSR precautions include no combined shoulder extension, IR, and adduction. No shoulder and elbow AROM (bicep tenodesis) for at least the first 4 weeks   WEIGHT BEARING RESTRICTIONS: Yes no WB through LUE  FALLS: Has patient fallen in last 6 months? 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  Recent memory is intact.  Remote memory is intact.  Attention span and concentration are intact.  Expressive speech is intact.  Patient's fund of knowledge is within normal limits for educational level.    Gross Musculoskeletal Assessment Tremor: None Bulk: Normal Tone: Normal Incision is clean and dry with minimal blood staining on honeycomb dressing as well as minimal swelling around the incision. Skin is cool to touch and no signs of infection present.   Gait Deferred full gait assessment  Posture Forward head  with upper thoracic kyphosis and rounded shoulders  Cervical Screen Deferred  ROM ROM (Normal range in degrees)    Right (AROM) Left (PROM)  Shoulder    Flexion 160 90 (limited per protocol)  Extension    Abduction 180 90 (scaption)  External Rotation 88 0  Internal Rotation 70 stomach  Hands Behind Head    Hands Behind Back        Elbow    Flexion Full Full  Extension 0 0  Pronation WNL Full palm up  Supination WNL Full palm down  (* = pain; Blank rows = not tested)  UE MMT: MMT (out of 5) Right Left   Shoulder   Flexion 5   Extension    Abduction 5   External rotation 5   Internal rotation 5   Horizontal abduction    Horizontal adduction    Lower Trapezius    Rhomboids        Elbow  Flexion 5   Extension 5   Pronation 5   Supination  5       Wrist  Flexion 5 5  Extension 5 5  Radial deviation  5  Ulnar deviation  5      MCP  Flexion 5 5  Extension 5 5  Abduction 5 5  Adduction 5 5  (* = pain; Blank rows = not  tested)  Sensation Deferred  Reflexes Deferred  Palpation No tenderness to light palpation around entire L shoulder girdle;  Passive Accessory Intervertebral Motion Deferred  Accessory Motions/Glides Deferred  Muscle Length Testing Deferred  SPECIAL TESTS Deferred  Beighton scale Deferred   TODAY'S TREATMENT: 04/16/23  Subjective: Patient reports no pain on arrival. " I think I put my shoulder in a bad position last time I was here. It got sore but it better now. 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   Not today:  L  shoulder isometric abduction, adduction, IR, ER, flexion, and extension x 10 each with shoulder in neutral position; L elbow AROM flexion and manually resisted extension x 10; L forearm pronation/supination with gentle manual resistance x 10 each;     HOME EXERCISE PROGRAM:  Access Code: [email or web address] URL: [email or web address] Date: 02/28/2023 Prepared by: Ria Comment  Exercises - Seated Cervical Sidebending Stretch (Mirrored)  - 2 x daily - 7 x weekly - 3 reps - 30s hold - Seated Scapular Retraction  - 2 x daily - 7 x weekly - 2 sets - 10 reps - 3s hold - Circular Shoulder Pendulum with Table Support  - 2 x daily - 7 x weekly - 3 reps - 60s hold - Horizontal Shoulder Pendulum with Table Support  - 2 x daily - 7 x weekly - 3 reps - 60s hold -  Flexion-Extension Shoulder Pendulum with Table Support (Mirrored)  - 2 x daily - 7 x weekly - 3 reps - 60s hold - Wrist Flexion AROM  - 2 x daily - 7 x weekly - 2 sets - 10 reps - 3s hold - Wrist Extension AROM  - 1 x daily - 7 x weekly - 2 sets - 10 reps - 3s hold    ASSESSMENT:  Clinical Impression:   No notable pain and Jt PROM and PROM WNL. Pt is in 8th weeks S/P. Pt stated he is Compliant to HEP.  PT advised pt to not push or pull anything heavy at this point. Pt demosntrated ood understanding of risk of doing that and agreed to not do it.  PT remained Focused on AAROM, AROM, and gentle Strengthening exs. Pt's L shldr proprioception and Jt stability remains weak. . Pt encouraged to continue HEP and follow up as directed. He will continue to benefit from skilled therapy to address remaining deficits in order to improve quality of life and return to PLOF.    OBJECTIVE IMPAIRMENTS: decreased ROM, decreased strength, and pain.  ACTIVITY LIMITATIONS: carrying, lifting, bathing, toileting, dressing, and reach over head  PARTICIPATION LIMITATIONS: meal prep, cleaning, laundry, driving, shopping, community activity, and yard work  PERSONAL FACTORS: Past/current experiences, Time since onset of injury/illness/exacerbation, and 3+ comorbidities: OA, anemia, anxiety, cardiomyopathy, chronic pain  are also affecting patient's functional outcome.   REHAB POTENTIAL: Good  CLINICAL DECISION MAKING: Unstable/unpredictable  EVALUATION COMPLEXITY: High   GOALS: Goals reviewed with patient? Yes  SHORT TERM GOALS: Target date: 04/09/2023  Pt will be independent with HEP to improve strength and decrease shoulder pain to improve pain-free function at home and work. Baseline:  Goal status: INITIAL   LONG TERM GOALS: Target date: 05/21/2023  1.  Pt will report no further L shoulder pain with all AROM in order to complete all ADLs and improve pain-free function. Baseline:  Goal status: INITIAL  2.   Pt will decrease QuickDASH score by at least 8% in order to demonstrate clinically significant reduction in disability related to shoulder pain        Baseline: 02/26/23: 72.7% Goal status: INITIAL  3. Pt will increase strength of L shoulder flexion and scaption to at least 4/5 in order to demonstrate improvement in strength and function         Baseline: 02/26/23: Unable to test Goal status: INITIAL  4. Pt will improve L shoulder AROM flexion and scaption to at least 110 degrees in order to demonstrate improvement in function so he can take care of his property and complete all household responsibilities;        Baseline: 02/26/23: Unable to test (90 degrees PROM for both) Goal status: INITIAL   PLAN: PT FREQUENCY: 1-2x/week  PT DURATION: 12 weeks  PLANNED INTERVENTIONS: Therapeutic exercises, Therapeutic activity, Neuromuscular re-education, Balance training, Gait training, Patient/Family education, Self Care, Joint mobilization, Joint manipulation, Vestibular training, Canalith repositioning, Orthotic/Fit training, DME instructions, Dry Needling, Electrical stimulation, Spinal manipulation, Spinal mobilization, Cryotherapy, Moist heat, Taping, Traction, Ultrasound, Ionotophoresis 4mg /ml Dexamethasone, Manual therapy, and Re-evaluation.  PLAN FOR NEXT SESSION: Sensation testing, progress L shoulder per surgical protocol;  Janet Berlin PT DPT 3:03 PM,04/16/23

## 2023-04-17 NOTE — Telephone Encounter (Signed)
 Requested Prescriptions  Pending Prescriptions Disp Refills   traZODone (DESYREL) 150 MG tablet [Pharmacy Med Name: TRAZODONE 150 MG TAB[*]] 90 tablet 0    Sig: TAKE ONE TABLET BY MOUTH AT BEDTIME     Psychiatry: Antidepressants - Serotonin Modulator Failed - 04/17/2023  2:54 PM      Failed - Valid encounter within last 6 months    Recent Outpatient Visits   None     Future Appointments             In 1 month Duke Salvia, MD [facility] [place]   In 3 months Althea Charon, Netta Neat, DO [facility]

## 2023-04-19 ENCOUNTER — Ambulatory Visit

## 2023-04-20 ENCOUNTER — Encounter

## 2023-04-22 NOTE — Therapy (Signed)
 OUTPATIENT PHYSICAL THERAPY SHOULDER TREATMENT  Patient Name: [name] MRN: [id number] DOB:[birth year], 68 y.o., male Today's Date: 04/24/2023   PT End of Session - 04/23/23 1421     Visit Number 7    Number of Visits 25    Date for PT Re-Evaluation 05/21/23    Authorization Type eval: 02/26/23, [employer] Medicare, [id number] on AUTH    Authorization Time Period [facility] medicare 2024  [id number] on MN    PT Start Time 1400    PT Stop Time 1445    PT Time Calculation (min) 45 min    Activity Tolerance Patient tolerated treatment well    Behavior During Therapy WFL for tasks assessed/performed            Past Medical History:  Diagnosis Date   (HFpEF) heart failure with preserved ejection fraction (HCC)    a.) TTE 12/26/2013: EF 45-50%, mild ant and antsept HK. mild MR. Mod dil LA. nl RV fxn. Rhythm was Afib; b.) TTE 07/04/2019: EF 55%, mid-apical anteroseptal HK, mild MAC, mild Ao sclerosis, G2DD, RVSP 45.3; c.) TTE 06/02/2022: EF 55-60%, no RWMAs, sev LAE, RVSP 37.9, mild-mod MR, mild AR, AoV sclerosis without stenosis, asc Ao 38 mm   Anxiety    Aortic atherosclerosis (HCC)    Cardiomyopathy (in setting of Afib)    a.) TTE 12/26/2013: EF 45-50%; b.) TTE 07/04/2019: EF 55%; c.) TTE 06/02/2022: EF 55-60%   Chronic pain syndrome    a.) followed by pain management   Chronic, continuous use of opioids    a.) on COT; followed by pain management   Coronary artery disease 11/06/2014   a.) cCTA 11/06/2014: Ca2+ = 224 (74th %ile; LAD distribution)   Diverticulosis    DJD (degenerative joint disease) of knee    Former smoker    History of hiatal hernia    History of kidney stones 2012   Hyperlipidemia    Hyperplastic colon polyp    Hypertension    Inguinal hernia, left    Internal hemorrhoids    Intervertebral disc disorder with radiculopathy of lumbosacral region    Lesion of bone of lumbosacral spine    L5   Long term current use of amiodarone    Lower extremity edema    Mixed  hyperlipidemia    Morbid obesity (HCC)    Multiple falls    Myalgia due to statin    On rivaroxaban therapy    Osteoarthritis    PAF (paroxysmal atrial fibrillation) (HCC)    a.) CHA2DS2VASc = 4 (age, HTN, CHF, vascular disease) as of 02/19/23;  b.) s/p DCCV 03/13/14 (200 J), 05/15/19 (150 J x 1, 200 J x2), 05/19/2022 (150 J x1, 200 J x2; pads changed to ant/lat postion with additional 200 J x 2 -> did not convert), 07/25/22 (200 J), 09/21/2022 (200 J); c.) s/p PVI ablation 11/17/2022; d.) rate/rhythm maintained on amiodarone; chronically anticoagulated on rivaroxaban   Pernicious anemia    Prostate cancer (HCC)    Pulmonary nodule, right    a. 10/2014 Cardiac CTA: 7mm RLL nodule; b. 04/2015 CT Chest: stable 7mm RLL nodule. No new nodules; 02/2017 CTA Chest: stable, benign, 7mm RLL pulm nodule.   Rotator cuff tear, left    Sleep difficulties    a.) takes melatonin + trazodone PRN   SVT (supraventricular tachycardia) (HCC)    Tubular adenoma of colon    Past Surgical History:  Procedure Laterality Date   ATRIAL FIBRILLATION ABLATION N/A 11/17/2022   Procedure: ATRIAL FIBRILLATION  ABLATION; Location: [facility]; Surgeon: Sheran Luz, MD   BICEPT TENODESIS Right 09/27/2021   Procedure: Right reverse shoulder arthroplasty, biceps tenodesis;  Surgeon: Signa Kell, MD;  Location: [facility];  Service: Orthopedics;  Laterality: Right;   CARDIOVERSION N/A 05/15/2019   Procedure: CARDIOVERSION;  Surgeon: Antonieta Iba, MD;  Location: [facility];  Service: Cardiovascular;  Laterality: N/A;   CARDIOVERSION N/A 03/13/2014   Procedure: CARDIOVERSION; Location: [facility]; Surgeon: [name], MD   CARDIOVERSION N/A 03/24/2022   Procedure: CARDIOVERSION;  Surgeon: Antonieta Iba, MD;  Location: [facility];  Service: Cardiovascular;  Laterality: N/A;   CARDIOVERSION N/A 05/19/2022   Procedure: CARDIOVERSION;  Surgeon: Antonieta Iba, MD;  Location: [facility];  Service: Cardiovascular;  Laterality: N/A;    CARDIOVERSION N/A 07/25/2022   Procedure: CARDIOVERSION;  Surgeon: Duke Salvia, MD;  Location: [facility];  Service: Cardiovascular;  Laterality: N/A;   CARDIOVERSION N/A 09/21/2022   Procedure: CARDIOVERSION;  Surgeon: Duke Salvia, MD;  Location: [facility];  Service: Cardiovascular;  Laterality: N/A;   CARPAL TUNNEL RELEASE Right 12/23/2014   Procedure: CARPAL TUNNEL RELEASE;  Surgeon: Deeann Saint, MD;  Location: [facility];  Service: Orthopedics;  Laterality: Right;   CARPAL TUNNEL RELEASE Left 01/06/2015   Procedure: CARPAL TUNNEL RELEASE;  Surgeon: Deeann Saint, MD;  Location: [facility];  Service: Orthopedics;  Laterality: Left;   COLONOSCOPY WITH PROPOFOL N/A 10/08/2017   Procedure: COLONOSCOPY WITH PROPOFOL;  Surgeon: Toney Reil, MD;  Location: [facility];  Service: Gastroenterology;  Laterality: N/A;   COLONOSCOPY WITH PROPOFOL N/A 06/07/2022   Procedure: COLONOSCOPY WITH PROPOFOL;  Surgeon: Toney Reil, MD;  Location: [facility];  Service: Gastroenterology;  Laterality: N/A;   ENTEROSCOPY  06/07/2022   Procedure: ENTEROSCOPY;  Surgeon: Toney Reil, MD;  Location: [facility];  Service: Gastroenterology;;   ESOPHAGOGASTRODUODENOSCOPY (EGD) WITH PROPOFOL N/A 06/07/2022   Procedure: ESOPHAGOGASTRODUODENOSCOPY (EGD) WITH PROPOFOL;  Surgeon: Toney Reil, MD;  Location: [facility];  Service: Gastroenterology;  Laterality: N/A;   GASTRIC BYPASS  01/17/2000   KNEE SURGERY Right    knee trauma x3   prostate seeding     REVERSE SHOULDER ARTHROPLASTY Right 09/27/2021   Procedure: Right reverse shoulder arthroplasty, biceps tenodesis;  Surgeon: Signa Kell, MD;  Location: [facility];  Service: Orthopedics;  Laterality: Right;   REVERSE SHOULDER ARTHROPLASTY Left 02/20/2023   Procedure: Left reverse shoulder arthroplasty, biceps tenodesis;  Surgeon: Signa Kell, MD;  Location: [facility];  Service: Orthopedics;  Laterality: Left;   SHOULDER  ARTHROSCOPY WITH ROTATOR CUFF REPAIR AND OPEN BICEPS TENODESIS Left 12/27/2021   Procedure: Left shoulder arthroscopic cuff repair (supraspinatus and subscapularis) with Regeneten Patch application;  Surgeon: Signa Kell, MD;  Location: [facility];  Service: Orthopedics;  Laterality: Left;   Patient Active Problem List   Diagnosis Date Noted   Symptomatic anemia 04/26/2022   Chronic pain of both knees 03/30/2022   Bilateral primary osteoarthritis of knee 03/30/2022   Shortness of breath 03/24/2022   Vitamin D deficiency 02/24/2022   S/p reverse total shoulder arthroplasty 09/27/2021   Lesion of bone of lumbosacral spine (L5) 05/12/2021   Localized osteoarthritis of shoulder regions, bilateral 03/29/2021   Chronic pain of both shoulders 03/29/2021   Drug-induced myopathy 02/21/2021   Trigger finger of right hand 11/15/2020   Prostate cancer (HCC) 07/26/2020   Insomnia 08/01/2019   Primary osteoarthritis of both wrists 02/17/2019   Trigger middle finger of left hand 02/17/2019   Chronic pain syndrome 02/17/2019  Chronic radicular lumbar pain 10/08/2018   Lumbar radiculopathy 10/08/2018   Lumbar degenerative disc disease 10/08/2018   Lumbar facet arthropathy 10/08/2018   Lumbar facet joint syndrome 10/08/2018   Intervertebral disc disorder with radiculopathy of lumbosacral region    Osteoarthritis of knee 11/30/2017   Osteoarthritis of wrist 11/30/2017   Pernicious anemia 05/22/2016   Persistent atrial fibrillation (HCC) 12/20/2015   Carpal tunnel syndrome 11/16/2014   DJD (degenerative joint disease) of knee 10/27/2014   Bilateral carpal tunnel syndrome 10/27/2014   Morbid obesity with BMI of 45.0-49.9, adult (HCC) 10/27/2014   Mixed hyperlipidemia 10/27/2014   H/O gastric bypass 03/20/2014   Hyperkalemia 03/20/2014   History of prostate cancer 12/26/2013   Essential hypertension 12/26/2013   Encounter for anticoagulation discussion and counseling 12/26/2013    PCP: Dr.  Althea Charon  REFERRING PROVIDER: Dr. Signa Kell  REFERRING DIAG: [phone] (ICD-10-CM) - Complete rotator cuff tear or rupture of left shoulder, not specified as traumatic   RATIONALE FOR EVALUATION AND TREATMENT: Rehabilitation  THERAPY DIAG: Chronic left shoulder pain  Muscle weakness (generalized)  ONSET DATE: 02/20/23  FOLLOW-UP APPT SCHEDULED WITH REFERRING PROVIDER: Yes   FROM INITIAL EVALUATION SUBJECTIVE:                                                                                                                                                                                         SUBJECTIVE STATEMENT:  L reverse TSR 02/20/23  PERTINENT HISTORY:  JAPHETH DIEKMAN is a 68 y.o. male who initially underwent arthroscopic subscapularis repair and side-to-side supraspinatus repair with Regeneten patch application by Dr. Allena Katz 12/27/2021. He underwent post-surgical physical therapy with initial minimal improvement followed by worsening shoulder strength/function. Repeat MRI showed large, retracted subscapularis re-tear and supraspinatus re-tear with signs of rotator cuff arthropathy. Conservative measures including medications, physical therapy, and cortisone injections did not provide adequate relief. The patient elected to proceed with reverse shoulder arthroplasty and biceps tenodesis on 02/20/23. Intraoperative findings include retracted full-thickness subscapularis tear, partial-thickness supraspinatus tear, and biceps tendinopathy. Surgery was a standard deltopectoral incision under general anesthesia along with an interscalene block. Per op note, operative arm to remain in sling at all times except ROM exercises and hygiene. Can perform pendulums, elbow/wrist/hand ROM exercises. Passive ROM allowed to 90 FF and 30 ER. ASA 325mg  x 6 weeks for DVT ppx (pt states that he was actually instructed to start back on his Xarelto and not to use ASA). Plan for PT starting on POD #3-4. Pt denies any  post-operative complications from surgery. Pt has a history of chronic back pain and has undergone multiple lumbar facet, medial branch radiofrequency ablations.  He also has a past history of cardiomyopathy (in setting of Afib), DJD (degenerative joint disease) of knee, kidney stones, lower extremity edema, PAF (paroxysmal atrial fibrillation), pernicious anemia, prostate cancer, and a pulmonary nodule. He also has a past surgical history that includes R reverse TSR, knee surgery, gastric bypass (2002), prostate seeding, carpal tunnel release (Right, 12/23/2014; Left, 01/06/2015), and multiple cardioversions.   PAIN:  Pain Intensity: No resting pain upon arrival Pain location: L shoulder Pain Quality: "Nerve pain" Radiating: No  Numbness/Tingling: No Focal Weakness: Yes 24-hour pain behavior: Varies History of prior shoulder or neck/shoulder injury, pain, surgery, or therapy: Yes PT for L shoulder s/p RTC repair, history of R reverse TSR; Dominant hand: right Red flags Positive: history of prostate CA, Negative: chills/fever, night sweats, nausea, vomiting, unrelenting pain, unexplained weight gain/loss  PRECAUTIONS: Shoulder, per operative note: operative arm to remain in sling at all times except ROM exercises and hygiene. Can perform pendulums, elbow/wrist/hand ROM exercises. Passive ROM allowed to 90 FF and 30 ER.   General reverse TSR precautions include no combined shoulder extension, IR, and adduction. No shoulder and elbow AROM (bicep tenodesis) for at least the first 4 weeks   WEIGHT BEARING RESTRICTIONS: Yes no WB through LUE  FALLS: Has patient fallen in last 6 months? 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  Recent memory is intact.  Remote memory is intact.  Attention span and concentration are intact.  Expressive speech is intact.  Patient's fund of knowledge is within normal limits for educational level.    Gross Musculoskeletal Assessment Tremor: None Bulk: Normal Tone: Normal Incision is clean and dry with minimal blood staining on honeycomb dressing as well as minimal swelling around the incision. Skin is cool to touch and no signs of infection present.   Gait Deferred full gait assessment  Posture Forward head  with upper thoracic kyphosis and rounded shoulders  Cervical Screen Deferred  ROM ROM (Normal range in degrees)    Right (AROM) Left (PROM)  Shoulder    Flexion 160 90 (limited per protocol)  Extension    Abduction 180 90 (scaption)  External Rotation 88 0  Internal Rotation 70 stomach  Hands Behind Head    Hands Behind Back        Elbow    Flexion Full Full  Extension 0 0  Pronation WNL Full palm up  Supination WNL Full palm down  (* = pain; Blank rows = not tested)  UE MMT: MMT (out of 5) Right Left   Shoulder   Flexion 5   Extension    Abduction 5   External rotation 5   Internal rotation 5   Horizontal abduction    Horizontal adduction    Lower Trapezius    Rhomboids        Elbow  Flexion 5   Extension 5   Pronation 5   Supination 5  Wrist  Flexion 5 5  Extension 5 5  Radial deviation  5  Ulnar deviation  5      MCP  Flexion 5 5  Extension 5 5  Abduction 5 5  Adduction 5 5  (* = pain; Blank rows = not  tested)  Sensation Deferred  Reflexes Deferred  Palpation No tenderness to light palpation around entire L shoulder girdle;  Passive Accessory Intervertebral Motion Deferred  Accessory Motions/Glides Deferred  Muscle Length Testing Deferred  SPECIAL TESTS Deferred  Beighton scale Deferred   TODAY'S TREATMENT: 04/23/23   Subjective: Patient reports no pain on arrival but he is having some soreness. No specific questions or concerns currently.     Ther-ex  In semi-recumbent position: PROM in L shoulder Flex/ABD/ER within pain-free range as protocol allows; PROM L elbow flexion/extension; L  shoulder isometric abduction, adduction, IR, ER, flexion, and extension x 10 each with shoulder in neutral position; L elbow AROM flexion and extension with gentle, pain-free manually resisted extension 2 x 10 each; L shoulder AROM flexion 2 x 10; L shoulder AROM scaption 2 x 10; Dowel chest press with manual resistance from therapist 2 x 10; Ice pack applied to L shoulder at end of session (unbilled);   HOME EXERCISE PROGRAM:  Access Code: [email or web address] URL: [email or web address] Date: 02/28/2023 Prepared by: Ria Comment  Exercises - Seated Cervical Sidebending Stretch (Mirrored)  - 2 x daily - 7 x weekly - 3 reps - 30s hold - Seated Scapular Retraction  - 2 x daily - 7 x weekly - 2 sets - 10 reps - 3s hold - Circular Shoulder Pendulum with Table Support  - 2 x daily - 7 x weekly - 3 reps - 60s hold - Horizontal Shoulder Pendulum with Table Support  - 2 x daily - 7 x weekly - 3 reps - 60s hold - Flexion-Extension Shoulder Pendulum with Table Support (Mirrored)  - 2 x daily - 7 x weekly - 3 reps - 60s hold - Wrist Flexion AROM  - 2 x daily - 7 x weekly - 2 sets - 10 reps - 3s hold - Wrist Extension AROM  - 1 x daily - 7 x weekly - 2 sets - 10 reps - 3s hold    ASSESSMENT:  Clinical Impression:   No notable pain reported during therapy session today. Focused  on AAROM, AROM, and isometric strengthening. Pt encouraged to continue HEP and follow up as directed. He will continue to benefit from skilled therapy to address remaining deficits in order to improve quality of life and return to PLOF.    OBJECTIVE IMPAIRMENTS: decreased ROM, decreased strength, and pain.   ACTIVITY LIMITATIONS: carrying, lifting, bathing, toileting, dressing, and reach over head  PARTICIPATION LIMITATIONS: meal prep, cleaning, laundry, driving, shopping, community activity, and yard work  PERSONAL FACTORS: Past/current experiences, Time since onset of injury/illness/exacerbation, and 3+ comorbidities: OA, anemia, anxiety, cardiomyopathy, chronic pain  are also affecting patient's functional outcome.   REHAB POTENTIAL: Good  CLINICAL DECISION MAKING: Unstable/unpredictable  EVALUATION COMPLEXITY: High   GOALS: Goals reviewed with patient? Yes  SHORT TERM GOALS: Target date: 04/09/2023  Pt will be independent with HEP to improve strength and decrease shoulder pain to improve pain-free function at home and work. Baseline:  Goal status: INITIAL   LONG TERM GOALS: Target date: 05/21/2023  1.  Pt will report no further L shoulder pain with all AROM in order to complete all ADLs and improve pain-free function. Baseline:  Goal status:  INITIAL  2.  Pt will decrease QuickDASH score by at least 8% in order to demonstrate clinically significant reduction in disability related to shoulder pain        Baseline: 02/26/23: 72.7% Goal status: INITIAL  3. Pt will increase strength of L shoulder flexion and scaption to at least 4/5 in order to demonstrate improvement in strength and function         Baseline: 02/26/23: Unable to test Goal status: INITIAL  4. Pt will improve L shoulder AROM flexion and scaption to at least 110 degrees in order to demonstrate improvement in function so he can take care of his property and complete all household responsibilities;        Baseline:  02/26/23: Unable to test (90 degrees PROM for both) Goal status: INITIAL   PLAN: PT FREQUENCY: 1-2x/week  PT DURATION: 12 weeks  PLANNED INTERVENTIONS: Therapeutic exercises, Therapeutic activity, Neuromuscular re-education, Balance training, Gait training, Patient/Family education, Self Care, Joint mobilization, Joint manipulation, Vestibular training, Canalith repositioning, Orthotic/Fit training, DME instructions, Dry Needling, Electrical stimulation, Spinal manipulation, Spinal mobilization, Cryotherapy, Moist heat, Taping, Traction, Ultrasound, Ionotophoresis 4mg /ml Dexamethasone, Manual therapy, and Re-evaluation.  PLAN FOR NEXT SESSION: progress L shoulder per surgical protocol;  Sharalyn Ink Taiten Brawn PT, DPT, GCS  10:13 PM,04/24/23

## 2023-04-23 ENCOUNTER — Ambulatory Visit: Attending: Orthopedic Surgery

## 2023-04-23 DIAGNOSIS — M25512 Pain in left shoulder: Secondary | ICD-10-CM | POA: Diagnosis not present

## 2023-04-23 DIAGNOSIS — M6281 Muscle weakness (generalized): Secondary | ICD-10-CM | POA: Diagnosis not present

## 2023-04-23 DIAGNOSIS — G8929 Other chronic pain: Secondary | ICD-10-CM | POA: Diagnosis not present

## 2023-04-25 ENCOUNTER — Ambulatory Visit

## 2023-04-30 ENCOUNTER — Ambulatory Visit

## 2023-05-02 ENCOUNTER — Encounter

## 2023-05-07 ENCOUNTER — Ambulatory Visit

## 2023-05-07 DIAGNOSIS — G8929 Other chronic pain: Secondary | ICD-10-CM

## 2023-05-07 DIAGNOSIS — M6281 Muscle weakness (generalized): Secondary | ICD-10-CM

## 2023-05-08 ENCOUNTER — Encounter: Payer: Self-pay | Admitting: Family Medicine

## 2023-05-08 ENCOUNTER — Ambulatory Visit (INDEPENDENT_AMBULATORY_CARE_PROVIDER_SITE_OTHER): Admitting: Family Medicine

## 2023-05-08 VITALS — BP 154/94 | HR 97 | Resp 19 | Ht 72.0 in | Wt 349.5 lb

## 2023-05-08 DIAGNOSIS — Z87442 Personal history of urinary calculi: Secondary | ICD-10-CM

## 2023-05-08 DIAGNOSIS — N3001 Acute cystitis with hematuria: Secondary | ICD-10-CM | POA: Diagnosis not present

## 2023-05-08 DIAGNOSIS — R300 Dysuria: Secondary | ICD-10-CM | POA: Diagnosis not present

## 2023-05-08 DIAGNOSIS — R310 Gross hematuria: Secondary | ICD-10-CM

## 2023-05-08 LAB — POCT URINALYSIS DIPSTICK
Bilirubin, UA: NEGATIVE
Glucose, UA: NEGATIVE
Ketones, UA: NEGATIVE
Nitrite, UA: POSITIVE
Protein, UA: POSITIVE — AB
Spec Grav, UA: 1.015 (ref 1.010–1.025)
Urobilinogen, UA: 2 U/dL — AB
pH, UA: 5 (ref 5.0–8.0)

## 2023-05-08 MED ORDER — CEPHALEXIN 500 MG PO CAPS
500.0000 mg | ORAL_CAPSULE | Freq: Three times a day (TID) | ORAL | 0 refills | Status: DC
Start: 2023-05-08 — End: 2023-05-21

## 2023-05-08 NOTE — Patient Instructions (Addendum)
 Thank you for coming to the office today.  1. You have a Urinary Tract Infection - this is very common, your symptoms are reassuring and you should get better within 1 week on the antibiotics - Start Keflex  500mg  3 times daily for next 7 days, complete entire course, even if feeling better - We sent urine for a culture, we will call you within next few days if we need to change antibiotics - Please drink plenty of fluids, improve hydration over next 1 week  If symptoms worsening, developing nausea / vomiting, worsening back pain, fevers / chills / sweats, then please return for re-evaluation sooner.  If you take AZO OTC - limit this to 2-3 days MAX to avoid affecting kidneys  D-Mannose is a natural supplement that can actually help bind to urinary bacteria and reduce their effectiveness it can help prevent UTI from forming, and may reduce some symptoms. It likely cannot cure an active UTI but it is worth a try and good to prevent them with. Try 500mg  twice a day at a full dose if you want, or check package instructions for more info  Referral to Urologist for blood seen in urine and prior kidney stones.  [facility] -1st floor [address] [place],  [place]  [address] Phone: [phone]  Please schedule a Follow-up Appointment to: Return if symptoms worsen or fail to improve.  If you have any other questions or concerns, please feel free to call the office or send a message through MyChart. You may also schedule an earlier appointment if necessary.  Additionally, you may be receiving a survey about your experience at our office within a few days to 1 week by e-mail or mail. We value your feedback.  Domingo Friend, DO [facility], [place]

## 2023-05-08 NOTE — Progress Notes (Signed)
 Subjective:    Patient ID: [name], male    DOB: April 07, 1955, 68 y.o.   MRN: [id number]  [name] is a 68 y.o. male presenting on 05/08/2023 for Urinary Tract Infection (For 1 week)  Patient presents for a same day appointment.  HPI  Discussed the use of AI scribe software for clinical note transcription with the patient, who gave verbal consent to proceed.  History of Present Illness   [name] is a 68 year old male with a history of kidney stones who presents with urinary symptoms and hematuria.  He has been experiencing urinary symptoms, including difficulty with urination and straining at the end of urination, which he initially attributed to prostate issues. He has noticed blood in his urine, describing it as a 'pink color' and 'reddish' on a white cloth after wiping. These symptoms have persisted for over a month.  Approximately a month ago, he was prescribed an antibiotic low dose Augmentin for a dental issue. Last week, [place], his symptoms worsened, and he developed a fever and severe back pain, which he associated with a possible kidney stone due to his history. He has experienced kidney stones in the past, requiring both ultrasound and surgical intervention. 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  He has been consuming cranberry juice, about a gallon and a half since last Wednesday, which provides temporary relief. He has not had a CT scan since February 2024, which did not indicate stones at that time.  He is currently taking muscle relaxants for shoulder pain and has a prescription for Flomax , which he has not used. He is undergoing physical therapy following a shoulder replacement surgery approximately 11-12 weeks ago.  He reports feeling fatigued and experiencing palpitations, which he attributes to anemia. No current back or flank pain but experiences burning and straining at the end of urination.          05/08/2023     3:13 PM 03/28/2023   11:02 AM 02/13/2023   11:11 AM  Depression screen PHQ 2/9  Decreased Interest 2 0 1  Down, Depressed, Hopeless 1 0 0  PHQ - 2 Score 3 0 1  Altered sleeping 3    Tired, decreased energy 0    Change in appetite 0    Feeling bad or failure about yourself  0    Trouble concentrating 2    Moving slowly or fidgety/restless 2    Suicidal thoughts 0    PHQ-9 Score 10    Difficult doing work/chores Extremely dIfficult         05/08/2023    3:14 PM 01/24/2023    8:24 AM 08/01/2022    1:22 PM 02/03/2022    3:22 PM  GAD 7 : Generalized Anxiety Score  Nervous, Anxious, on Edge 2 1 1 2   Control/stop worrying 1 0 0 1  Worry too much - different things 0 0 1 1  Trouble relaxing 0 1 0 1  Restless 0 0 0   Easily annoyed or irritable 2 1 1 2   Afraid - awful might happen 1 0 0 0  Total GAD 7 Score 6 3 3    Anxiety Difficulty Very difficult       Social History   Tobacco Use   Smoking status: Former    Current packs/day: 0.00    Average packs/day: 1 pack/day for 10.0 years (10.0 ttl pk-yrs)    Types: Cigarettes    Start  date: 02/17/1969    Quit date: 02/18/1979    Years since quitting: 44.2   Smokeless tobacco: Former    Types: Snuff  Vaping Use   Vaping status: Never Used  Substance Use Topics   Alcohol  use: Not Currently    Comment: last drink in 2021   Drug use: No    Review of Systems Per HPI unless specifically indicated above     Objective:    BP (!) 154/94 (BP Location: Left Arm, Cuff Size: Normal)   Pulse 97   Resp 19   Ht 6' (1.829 m)   Wt (!) 349 lb 8 oz (158.5 kg)   SpO2 100%   BMI 47.40 kg/m   Wt Readings from Last 3 Encounters:  05/08/23 (!) 349 lb 8 oz (158.5 kg)  03/28/23 (!) 347 lb (157.4 kg)  02/28/23 (!) 349 lb 3.2 oz (158.4 kg)    Physical Exam Vitals and nursing note reviewed.  Constitutional:      General: He is not in acute distress.    Appearance: Normal appearance. He is well-developed. He is not diaphoretic.      Comments: Well-appearing, comfortable, cooperative  HENT:     Head: Normocephalic and atraumatic.  Eyes:     General:        Right eye: No discharge.        Left eye: No discharge.     Conjunctiva/sclera: Conjunctivae normal.  Cardiovascular:     Rate and Rhythm: Normal rate.  Pulmonary:     Effort: Pulmonary effort is normal.  Skin:    General: Skin is warm and dry.     Findings: No erythema or rash.  Neurological:     Mental Status: He is alert and oriented to person, place, and time.  Psychiatric:        Mood and Affect: Mood normal.        Behavior: Behavior normal.        Thought Content: Thought content normal.     Comments: Well groomed, good eye contact, normal speech and thoughts     Results for orders placed or performed in visit on 05/08/23  POCT Urinalysis Dipstick   Collection Time: 05/08/23  3:15 PM  Result Value Ref Range   Color, UA     Clarity, UA     Glucose, UA Negative Negative   Bilirubin, UA Negative    Ketones, UA Negative    Spec Grav, UA 1.015 1.010 - 1.025   Blood, UA Large 3+    pH, UA 5.0 5.0 - 8.0   Protein, UA Positive (A) Negative   Urobilinogen, UA 2.0 (A) 0.2 or 1.0 E.U./dL   Nitrite, UA Positive    Leukocytes, UA Trace (A) Negative   Appearance     Odor Yes    *Note: Due to a large number of results and/or encounters for the requested time period, some results have not been displayed. A complete set of results can be found in Results Review.      Assessment & Plan:   Problem List Items Addressed This Visit   None Visit Diagnoses       Acute cystitis with hematuria    -  Primary   Relevant Medications   cephALEXin  (KEFLEX ) 500 MG capsule   Other Relevant Orders   Urine Culture   Ambulatory referral to Urology     Gross hematuria       Relevant Orders   Ambulatory referral to Urology  Dysuria       Relevant Orders   POCT Urinalysis Dipstick (Completed)     History of nephrolithiasis       Relevant Orders    Ambulatory referral to Urology       Urinary tract infection Gross Hematuria Suspected UTI based on positive urine dipstick and symptoms. Differential includes nephrolithiasis due to history and gross hematuria.  Denies fever or nausea vomiting flank pain acutely  - Prescribed Keflex  500 mg TID for 7 days. (Considered Cipro for possible stone related disease with reported back pain previously however, given his recent shoulder repair will avoid FQ) - Sent urine for culture to confirm infection and check for antibiotic resistance.  Gross hematuria Possible causes include UTI or nephrolithiasis. History of nephrolithiasis increases suspicion for stones. - Referral to urology referral for further evaluation. Note his previous Urology retired  Nephrolithiasis Current symptoms could be related to stones. CT scan from February 2024 negative for stones. - Referred to urology for evaluation and potential imaging if symptoms persist. - Advised on Flomax  use if symptoms suggest stone passage. Note he already has this med.         Orders Placed This Encounter  Procedures   Urine Culture   Ambulatory referral to Urology    Referral Priority:   Routine    Referral Type:   Consultation    Referral Reason:   Specialty Services Required    Requested Specialty:   Urology    Number of Visits Requested:   1   POCT Urinalysis Dipstick    Meds ordered this encounter  Medications   cephALEXin  (KEFLEX ) 500 MG capsule    Sig: Take 1 capsule (500 mg total) by mouth 3 (three) times daily. For 7 days    Dispense:  21 capsule    Refill:  0    Follow up plan: Return if symptoms worsen or fail to improve.  Domingo Friend, DO [facility] 05/08/2023, 3:17 PM

## 2023-05-09 ENCOUNTER — Telehealth: Payer: Self-pay

## 2023-05-09 ENCOUNTER — Ambulatory Visit: Payer: Medicare Other | Admitting: Student in an Organized Health Care Education/Training Program

## 2023-05-09 ENCOUNTER — Ambulatory Visit

## 2023-05-09 ENCOUNTER — Telehealth: Payer: Self-pay | Admitting: Student in an Organized Health Care Education/Training Program

## 2023-05-09 NOTE — Telephone Encounter (Signed)
 Patient called stating he was diagnosed with a kidney infection and just started antibiotics.  Dr Rhesa Celeste notified and the procedure will be cancelled per Doctor Rhesa Celeste and he is to reschedule when the antibiotics are completed and the kidney infection is no longer present.

## 2023-05-09 NOTE — Telephone Encounter (Signed)
 PT called stated that he started taking Cephalexin  on yesterday and patient wanted to see if he should still come in today for procedure on today. Please give patient a call. TY

## 2023-05-10 ENCOUNTER — Encounter: Payer: Self-pay | Admitting: Family Medicine

## 2023-05-10 LAB — URINE CULTURE
MICRO NUMBER:: 16362211
SPECIMEN QUALITY:: ADEQUATE

## 2023-05-13 ENCOUNTER — Other Ambulatory Visit: Payer: Self-pay | Admitting: Family Medicine

## 2023-05-14 ENCOUNTER — Telehealth: Payer: Self-pay | Admitting: Internal Medicine

## 2023-05-14 ENCOUNTER — Telehealth: Payer: Self-pay

## 2023-05-14 ENCOUNTER — Ambulatory Visit

## 2023-05-14 DIAGNOSIS — G8929 Other chronic pain: Secondary | ICD-10-CM

## 2023-05-14 DIAGNOSIS — M75122 Complete rotator cuff tear or rupture of left shoulder, not specified as traumatic: Secondary | ICD-10-CM | POA: Diagnosis not present

## 2023-05-14 DIAGNOSIS — M6281 Muscle weakness (generalized): Secondary | ICD-10-CM | POA: Diagnosis not present

## 2023-05-14 DIAGNOSIS — M25512 Pain in left shoulder: Secondary | ICD-10-CM | POA: Diagnosis not present

## 2023-05-14 DIAGNOSIS — N3001 Acute cystitis with hematuria: Secondary | ICD-10-CM

## 2023-05-14 DIAGNOSIS — Z96611 Presence of right artificial shoulder joint: Secondary | ICD-10-CM | POA: Diagnosis not present

## 2023-05-14 DIAGNOSIS — T84028A Dislocation of other internal joint prosthesis, initial encounter: Secondary | ICD-10-CM | POA: Diagnosis not present

## 2023-05-14 DIAGNOSIS — R310 Gross hematuria: Secondary | ICD-10-CM

## 2023-05-14 DIAGNOSIS — Z87442 Personal history of urinary calculi: Secondary | ICD-10-CM

## 2023-05-14 MED ORDER — SULFAMETHOXAZOLE-TRIMETHOPRIM 800-160 MG PO TABS: 1.0000 | ORAL_TABLET | Freq: Two times a day (BID) | ORAL | 0 refills | Status: AC

## 2023-05-14 NOTE — Telephone Encounter (Signed)
 Routing note to [facility] for review with new orders for STAT CT Hematuria Work up imaging.  He has history of kidney stones, recent UTI with gross hematuria. I referred him to Urology and he is scheduled for 06/15/23, essentially 1 month from now as routine referral.  Now he is calling back saying that symptoms not resolving, and asks to be seen urgently by Urology, sooner than 06/15/23  I would recommend doing imaging first, so if there is an urgent finding, we can submit this to Urology with an updated urgent referral request.   So, for now I am adding the STAT CT Hematuria imaging to be scheduled. Once imaging reviewed, we can reconsider if can bump the priority to urgent on Urology referral.  I have called patient, advised him about this plan. He said antibiotics Keflex  nearly resolved the problem then symptoms returned once ended antibiotic, some blood returned as well. I will add Bactrim antibiotic now while we wait on CT and reconsider plan.  Domingo Friend, DO [facility] 05/14/2023, 6:12 PM

## 2023-05-14 NOTE — Addendum Note (Signed)
 Addended by: Raina Bunting on: 05/14/2023 06:19 PM   Modules accepted: Orders

## 2023-05-14 NOTE — Telephone Encounter (Signed)
 Pt c/o medication issue:  1. Name of Medication: amiodarone  (PACERONE ) 200 MG tablet    2. How are you currently taking this medication (dosage and times per day)? TAKE ONE TABLET BY MOUTH TWICE A DAY - Patient taking differently: Take 200 mg by mouth in the morning.   3. Are you having a reaction (difficulty breathing--STAT)? No   4. What is your medication issue? Patient called to see if Dr. Rodolfo Clan wants him to stop taking the amiodarone  (PACERONE ) 200 MG tablet

## 2023-05-14 NOTE — Telephone Encounter (Signed)
 Copied from CRM [id number]. Topic: Referral - Question >> May 14, 2023  4:24 PM Leory Rands wrote: Reason for CRM: Patient is calling to ask if Dr. Linnell Richardson if he can put an urgent referral for urology. He is still having the same sx.  [facility] is Jun 15, 2023

## 2023-05-14 NOTE — Therapy (Signed)
 OUTPATIENT PHYSICAL THERAPY SHOULDER TREATMENT  Patient Name: [name] MRN: [id number] DOB:11-Apr-1955, 68 y.o., male Today's Date: 05/14/2023   PT End of Session - 05/14/23 1406     Visit Number 8    Number of Visits 25    Date for PT Re-Evaluation 05/21/23    Authorization Type eval: 02/26/23, [employer] Medicare, OZ:[id number] on AUTH    Authorization Time Period [facility] medicare 2024  HQ:IONGE on [place]    PT Start Time 1403    PT Stop Time 1445    PT Time Calculation (min) 42 min    Activity Tolerance Patient tolerated treatment well    Behavior During Therapy WFL for tasks assessed/performed            Past Medical History:  Diagnosis Date   (HFpEF) heart failure with preserved ejection fraction (HCC)    a.) TTE 12/26/2013: EF 45-50%, mild ant and antsept HK. mild MR. Mod dil LA. nl RV fxn. Rhythm was Afib; b.) TTE 07/04/2019: EF 55%, mid-apical anteroseptal HK, mild MAC, mild Ao sclerosis, G2DD, RVSP 45.3; c.) TTE 06/02/2022: EF 55-60%, no RWMAs, sev LAE, RVSP 37.9, mild-mod MR, mild AR, AoV sclerosis without stenosis, asc Ao 38 mm   Anxiety    Aortic atherosclerosis (HCC)    Cardiomyopathy (in setting of Afib)    a.) TTE 12/26/2013: EF 45-50%; b.) TTE 07/04/2019: EF 55%; c.) TTE 06/02/2022: EF 55-60%   Chronic pain syndrome    a.) followed by pain management   Chronic, continuous use of opioids    a.) on COT; followed by pain management   Coronary artery disease 11/06/2014   a.) cCTA 11/06/2014: Ca2+ = 224 (74th %ile; LAD distribution)   Diverticulosis    DJD (degenerative joint disease) of knee    Former smoker    History of hiatal hernia    History of kidney stones 2012   Hyperlipidemia    Hyperplastic colon polyp    Hypertension    Inguinal hernia, left    Internal hemorrhoids    Intervertebral disc disorder with radiculopathy of lumbosacral region    Lesion of bone of lumbosacral spine    L5   Long term current use of amiodarone     Lower extremity edema    Mixed  hyperlipidemia    Morbid obesity (HCC)    Multiple falls    Myalgia due to statin    On rivaroxaban  therapy    Osteoarthritis    PAF (paroxysmal atrial fibrillation) (HCC)    a.) CHA2DS2VASc = 4 (age, HTN, CHF, vascular disease) as of 02/19/23;  b.) s/p DCCV 03/13/14 (200 J), 05/15/19 (150 J x 1, 200 J x2), 05/19/2022 (150 J x1, 200 J x2; pads changed to ant/lat postion with additional 200 J x 2 -> did not convert), 07/25/22 (200 J), 09/21/2022 (200 J); c.) s/p PVI ablation 11/17/2022; d.) rate/rhythm maintained on amiodarone ; chronically anticoagulated on rivaroxaban    Pernicious anemia    Prostate cancer (HCC)    Pulmonary nodule, right    a. 10/2014 Cardiac CTA: 7mm RLL nodule; b. 04/2015 CT Chest: stable 7mm RLL nodule. No new nodules; 02/2017 CTA Chest: stable, benign, 7mm RLL pulm nodule.   Rotator cuff tear, left    Sleep difficulties    a.) takes melatonin + trazodone  PRN   SVT (supraventricular tachycardia) (HCC)    Tubular adenoma of colon    Past Surgical History:  Procedure Laterality Date   ATRIAL FIBRILLATION ABLATION N/A 11/17/2022   Procedure: ATRIAL FIBRILLATION  ABLATION; Location: [facility]; Surgeon: Gehi, Anil, MD   BICEPT TENODESIS Right 09/27/2021   Procedure: Right reverse shoulder arthroplasty, biceps tenodesis;  Surgeon: Lorri Rota, MD;  Location: [facility];  Service: Orthopedics;  Laterality: Right;   CARDIOVERSION N/A 05/15/2019   Procedure: CARDIOVERSION;  Surgeon: Devorah Fonder, MD;  Location: [facility];  Service: Cardiovascular;  Laterality: N/A;   CARDIOVERSION N/A 03/13/2014   Procedure: CARDIOVERSION; Location: [facility]; Surgeon: Belva Boyden, MD   CARDIOVERSION N/A 03/24/2022   Procedure: CARDIOVERSION;  Surgeon: Devorah Fonder, MD;  Location: [facility];  Service: Cardiovascular;  Laterality: N/A;   CARDIOVERSION N/A 05/19/2022   Procedure: CARDIOVERSION;  Surgeon: Devorah Fonder, MD;  Location: [facility];  Service: Cardiovascular;  Laterality: N/A;    CARDIOVERSION N/A 07/25/2022   Procedure: CARDIOVERSION;  Surgeon: Verona Goodwill, MD;  Location: [facility];  Service: Cardiovascular;  Laterality: N/A;   CARDIOVERSION N/A 09/21/2022   Procedure: CARDIOVERSION;  Surgeon: Verona Goodwill, MD;  Location: [facility];  Service: Cardiovascular;  Laterality: N/A;   CARPAL TUNNEL RELEASE Right 12/23/2014   Procedure: CARPAL TUNNEL RELEASE;  Surgeon: Marlynn Singer, MD;  Location: [facility];  Service: Orthopedics;  Laterality: Right;   CARPAL TUNNEL RELEASE Left 01/06/2015   Procedure: CARPAL TUNNEL RELEASE;  Surgeon: Marlynn Singer, MD;  Location: [facility];  Service: Orthopedics;  Laterality: Left;   COLONOSCOPY WITH PROPOFOL  N/A 10/08/2017   Procedure: COLONOSCOPY WITH PROPOFOL ;  Surgeon: Selena Daily, MD;  Location[facility];  Service: Gastroenterology;  Laterality: N/A;   COLONOSCOPY WITH PROPOFOL  N/A 06/07/2022   Procedure: COLONOSCOPY WITH PROPOFOL ;  Surgeon: Selena Daily, MD;  Location[facility];  Service: Gastroenterology;  Laterality: N/A;   ENTEROSCOPY  06/07/2022   Procedure: ENTEROSCOPY;  Surgeon: Selena Daily, MD;  Location: [facility]OSCOPY;  Service: Gastroenterology;;   ESOPHAGOGASTRODUODENOSCOPY (EGD) WITH PROPOFOL  N/A 06/07/2022   Procedure: ESOPHAGOGASTRODUODENOSCOPY (EGD) WITH PROPOFOL ;  Surgeon: Selena Daily, MD;  Location: [facility];  Service: Gastroenterology;  Laterality: N/A;   GASTRIC BYPASS  01/17/2000   KNEE SURGERY Right    knee trauma x3   prostate seeding     REVERSE SHOULDER ARTHROPLASTY Right 09/27/2021   Procedure: Right reverse shoulder arthroplasty, biceps tenodesis;  Surgeon: Lorri Rota, MD;  Location: [facility];  Service: Orthopedics;  Laterality: Right;   REVERSE SHOULDER ARTHROPLASTY Left 02/20/2023   Procedure: Left reverse shoulder arthroplasty, biceps tenodesis;  Surgeon: Lorri Rota, MD;  Location: [facility];  Service: Orthopedics;  Laterality: Left;   SHOULDER  ARTHROSCOPY WITH ROTATOR CUFF REPAIR AND OPEN BICEPS TENODESIS Left 12/27/2021   Procedure: Left shoulder arthroscopic cuff repair (supraspinatus and subscapularis) with Regeneten Patch application;  Surgeon: Lorri Rota, MD;  Location: [facility];  Service: Orthopedics;  Laterality: Left;   Patient Active Problem List   Diagnosis Date Noted   Symptomatic anemia 04/26/2022   Chronic pain of both knees 03/30/2022   Bilateral primary osteoarthritis of knee 03/30/2022   Shortness of breath 03/24/2022   Vitamin D  deficiency 02/24/2022   S/p reverse total shoulder arthroplasty 09/27/2021   Lesion of bone of lumbosacral spine (L5) 05/12/2021   Localized osteoarthritis of shoulder regions, bilateral 03/29/2021   Chronic pain of both shoulders 03/29/2021   Drug-induced myopathy 02/21/2021   Trigger finger of right hand 11/15/2020   Prostate cancer (HCC) 07/26/2020   Insomnia 08/01/2019   Primary osteoarthritis of both wrists 02/17/2019   Trigger middle finger of left hand 02/17/2019   Chronic pain syndrome 02/17/2019  Chronic radicular lumbar pain 10/08/2018   Lumbar radiculopathy 10/08/2018   Lumbar degenerative disc disease 10/08/2018   Lumbar facet arthropathy 10/08/2018   Lumbar facet joint syndrome 10/08/2018   Intervertebral disc disorder with radiculopathy of lumbosacral region    Osteoarthritis of knee 11/30/2017   Osteoarthritis of wrist 11/30/2017   Pernicious anemia 05/22/2016   Persistent atrial fibrillation (HCC) 12/20/2015   Carpal tunnel syndrome 11/16/2014   DJD (degenerative joint disease) of knee 10/27/2014   Bilateral carpal tunnel syndrome 10/27/2014   Morbid obesity with BMI of 45.0-49.9, adult (HCC) 10/27/2014   Mixed hyperlipidemia 10/27/2014   H/O gastric bypass 03/20/2014   Hyperkalemia 03/20/2014   History of prostate cancer 12/26/2013   Essential hypertension 12/26/2013   Encounter for anticoagulation discussion and counseling 12/26/2013    PCP: Dr.  Romeo Co  REFERRING PROVIDER: Dr. Lorri Rota  REFERRING DIAG: [phone] (ICD-10-CM) - Complete rotator cuff tear or rupture of left shoulder, not specified as traumatic   RATIONALE FOR EVALUATION AND TREATMENT: Rehabilitation  THERAPY DIAG: Chronic left shoulder pain  Muscle weakness (generalized)  ONSET DATE: 02/20/23  FOLLOW-UP APPT SCHEDULED WITH REFERRING PROVIDER: Yes   FROM INITIAL EVALUATION SUBJECTIVE:                                                                                                                                                                                         SUBJECTIVE STATEMENT:  L reverse TSR 02/20/23  PERTINENT HISTORY:  AMARA BILLEY is a 68 y.o. male who initially underwent arthroscopic subscapularis repair and side-to-side supraspinatus repair with Regeneten patch application by Dr. Lydia Sams 12/27/2021. He underwent post-surgical physical therapy with initial minimal improvement followed by worsening shoulder strength/function. Repeat MRI showed large, retracted subscapularis re-tear and supraspinatus re-tear with signs of rotator cuff arthropathy. Conservative measures including medications, physical therapy, and cortisone injections did not provide adequate relief. The patient elected to proceed with reverse shoulder arthroplasty and biceps tenodesis on 02/20/23. Intraoperative findings include retracted full-thickness subscapularis tear, partial-thickness supraspinatus tear, and biceps tendinopathy. Surgery was a standard deltopectoral incision under general anesthesia along with an interscalene block. Per op note, operative arm to remain in sling at all times except ROM exercises and hygiene. Can perform pendulums, elbow/wrist/hand ROM exercises. Passive ROM allowed to 90 FF and 30 ER. ASA 325mg  x 6 weeks for DVT ppx (pt states that he was actually instructed to start back on his Xarelto  and not to use ASA). Plan for PT starting on POD #3-4. Pt denies any  post-operative complications from surgery. Pt has a history of chronic back pain and has undergone multiple lumbar facet, medial branch radiofrequency ablations.  He also has a past history of cardiomyopathy (in setting of Afib), DJD (degenerative joint disease) of knee, kidney stones, lower extremity edema, PAF (paroxysmal atrial fibrillation), pernicious anemia, prostate cancer, and a pulmonary nodule. He also has a past surgical history that includes R reverse TSR, knee surgery, gastric bypass (2002), prostate seeding, carpal tunnel release (Right, 12/23/2014; Left, 01/06/2015), and multiple cardioversions.   PAIN:  Pain Intensity: No resting pain upon arrival Pain location: L shoulder Pain Quality: "Nerve pain" Radiating: No  Numbness/Tingling: No Focal Weakness: Yes 24-hour pain behavior: Varies History of prior shoulder or neck/shoulder injury, pain, surgery, or therapy: Yes PT for L shoulder s/p RTC repair, history of R reverse TSR; Dominant hand: right Red flags Positive: history of prostate CA, Negative: chills/fever, night sweats, nausea, vomiting, unrelenting pain, unexplained weight gain/loss  PRECAUTIONS: Shoulder, per operative note: operative arm to remain in sling at all times except ROM exercises and hygiene. Can perform pendulums, elbow/wrist/hand ROM exercises. Passive ROM allowed to 90 FF and 30 ER.   General reverse TSR precautions include no combined shoulder extension, IR, and adduction. No shoulder and elbow AROM (bicep tenodesis) for at least the first 4 weeks   WEIGHT BEARING RESTRICTIONS: Yes no WB through LUE  FALLS: Has patient fallen in last 6 months? 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  Recent memory is intact.  Remote memory is intact.  Attention span and concentration are intact.  Expressive speech is intact.  Patient's fund of knowledge is within normal limits for educational level.    Gross Musculoskeletal Assessment Tremor: None Bulk: Normal Tone: Normal Incision is clean and dry with minimal blood staining on honeycomb dressing as well as minimal swelling around the incision. Skin is cool to touch and no signs of infection present.   Gait Deferred full gait assessment  Posture Forward head  with upper thoracic kyphosis and rounded shoulders  Cervical Screen Deferred  ROM ROM (Normal range in degrees)    Right (AROM) Left (PROM)  Shoulder    Flexion 160 90 (limited per protocol)  Extension    Abduction 180 90 (scaption)  External Rotation 88 0  Internal Rotation 70 stomach  Hands Behind Head    Hands Behind Back        Elbow    Flexion Full Full  Extension 0 0  Pronation WNL Full palm up  Supination WNL Full palm down  (* = pain; Blank rows = not tested)  UE MMT: MMT (out of 5) Right Left   Shoulder   Flexion 5   Extension    Abduction 5   External rotation 5   Internal rotation 5   Horizontal abduction    Horizontal adduction    Lower Trapezius    Rhomboids        Elbow  Flexion 5   Extension 5   Pronation 5   Supination 5  Wrist  Flexion 5 5  Extension 5 5  Radial deviation  5  Ulnar deviation  5      MCP  Flexion 5 5  Extension 5 5  Abduction 5 5  Adduction 5 5  (* = pain; Blank rows = not  tested)  Sensation Deferred  Reflexes Deferred  Palpation No tenderness to light palpation around entire L shoulder girdle;  Passive Accessory Intervertebral Motion Deferred  Accessory Motions/Glides Deferred  Muscle Length Testing Deferred  SPECIAL TESTS Deferred  Beighton scale Deferred   TODAY'S TREATMENT: 05/14/23   Subjective: Patient reports no pain on arrival but he is having some soreness in his L shoulder. He had another episode of R shoulder rTSR subluxation/dislocation which spontaneously reduced after approximately 1.5 hours. He has not had any episodes of L shoulder subluxations/dislocations. He saw Dr. Lydia Sams today and reports that his R shoulder alignment on imaging was normal. No specific questions currently.     Ther-ex  In semi-recumbent position: L shoulder flexion with 2# dumbbell 2 x 10; L shoulder scaption with 2# DB 2 x 10; L shoulder manually resisted ER 2 x 10; L shoulder manually resisted IR 2 x 10; L elbow manually resisted flexion/extension 2 x 10 each; Dowel chest press with manual resistance from therapist 2 x 10; Nautilus lat pull downs 80# 3 x 10; Nautilus rows 50# 3 x 10; Ice pack applied to L shoulder at end of session (unbilled);   HOME EXERCISE PROGRAM:  Access Code: [email or web address] URL: [email or web address] Date: 02/28/2023 Prepared by: Crawford Dock  Exercises - Seated Cervical Sidebending Stretch (Mirrored)  - 2 x daily - 7 x weekly - 3 reps - 30s hold - Seated Scapular Retraction  - 2 x daily - 7 x weekly - 2 sets - 10 reps - 3s hold - Circular Shoulder Pendulum with Table Support  - 2 x daily - 7 x weekly - 3 reps - 60s hold - Horizontal Shoulder Pendulum with Table Support  - 2 x daily - 7 x weekly - 3 reps - 60s hold - Flexion-Extension Shoulder Pendulum with Table Support (Mirrored)  - 2 x daily - 7 x weekly - 3 reps - 60s hold - Wrist Flexion AROM  - 2 x daily - 7 x weekly - 2 sets - 10 reps - 3s hold -  Wrist Extension AROM  - 1 x daily - 7 x weekly - 2 sets - 10 reps - 3s hold    ASSESSMENT:  Clinical Impression:   No notable pain reported during therapy session today. Focused on progressive strengthening and utilized the Nautilus machine today. Pt encouraged to continue HEP and follow up as directed. He will continue to benefit from skilled therapy to address remaining deficits in order to improve quality of life and return to PLOF.    OBJECTIVE IMPAIRMENTS: decreased ROM, decreased strength, and pain.   ACTIVITY LIMITATIONS: carrying, lifting, bathing, toileting, dressing, and reach over head  PARTICIPATION LIMITATIONS: meal prep, cleaning, laundry, driving, shopping, community activity, and yard work  PERSONAL FACTORS: Past/current experiences, Time since onset of injury/illness/exacerbation, and 3+ comorbidities: OA, anemia, anxiety, cardiomyopathy, chronic pain  are also affecting patient's functional outcome.   REHAB POTENTIAL: Good  CLINICAL DECISION MAKING: Unstable/unpredictable  EVALUATION COMPLEXITY: High   GOALS: Goals reviewed with patient? Yes  SHORT TERM GOALS: Target date: 04/09/2023  Pt will be independent with HEP to improve strength and decrease shoulder pain to improve pain-free function at home and work. Baseline:  Goal status: INITIAL   LONG TERM GOALS: Target date: 05/21/2023  1.  Pt will report no further L shoulder pain with all AROM in order to complete all ADLs and improve pain-free function. Baseline:  Goal status: INITIAL  2.  Pt will decrease QuickDASH score by at least 8% in order to demonstrate clinically significant reduction in disability related to shoulder pain        Baseline: 02/26/23: 72.7% Goal status: INITIAL  3. Pt will increase strength of L shoulder flexion and scaption to at least 4/5 in order to demonstrate improvement in strength and function         Baseline: 02/26/23: Unable to test Goal status: INITIAL  4. Pt will improve L  shoulder AROM flexion and scaption to at least 110 degrees in order to demonstrate improvement in function so he can take care of his property and complete all household responsibilities;        Baseline: 02/26/23: Unable to test (90 degrees PROM for both) Goal status: INITIAL   PLAN: PT FREQUENCY: 1-2x/week  PT DURATION: 12 weeks  PLANNED INTERVENTIONS: Therapeutic exercises, Therapeutic activity, Neuromuscular re-education, Balance training, Gait training, Patient/Family education, Self Care, Joint mobilization, Joint manipulation, Vestibular training, Canalith repositioning, Orthotic/Fit training, DME instructions, Dry Needling, Electrical stimulation, Spinal manipulation, Spinal mobilization, Cryotherapy, Moist heat, Taping, Traction, Ultrasound, Ionotophoresis 4mg /ml Dexamethasone , Manual therapy, and Re-evaluation.  PLAN FOR NEXT SESSION: progress L shoulder per surgical protocol;  Sherill Ding Ryla Cauthon PT, DPT, GCS  9:10 PM,05/14/23

## 2023-05-14 NOTE — Telephone Encounter (Signed)
 Called patient, LVM to call back to discuss follow up appointment as Dr.Klein recommended to rediscuss amiodarone  in 3 months- upcoming May appointment was cancelled due to being out of office, did reschedule for 05/16 with Suzann, NP to discuss further before making adjustments.   Left call back number for patient to return call.

## 2023-05-15 ENCOUNTER — Ambulatory Visit
Admission: RE | Admit: 2023-05-15 | Discharge: 2023-05-15 | Disposition: A | Discharge status: Home / Self Care | Source: Ambulatory Visit | Attending: Family Medicine | Admitting: Family Medicine

## 2023-05-15 ENCOUNTER — Encounter: Payer: Self-pay | Admitting: Family Medicine

## 2023-05-15 DIAGNOSIS — Z87442 Personal history of urinary calculi: Secondary | ICD-10-CM | POA: Diagnosis not present

## 2023-05-15 DIAGNOSIS — N281 Cyst of kidney, acquired: Secondary | ICD-10-CM | POA: Diagnosis not present

## 2023-05-15 DIAGNOSIS — N289 Disorder of kidney and ureter, unspecified: Secondary | ICD-10-CM | POA: Diagnosis not present

## 2023-05-15 DIAGNOSIS — R310 Gross hematuria: Secondary | ICD-10-CM | POA: Insufficient documentation

## 2023-05-15 DIAGNOSIS — N200 Calculus of kidney: Secondary | ICD-10-CM | POA: Diagnosis not present

## 2023-05-15 MED ORDER — SODIUM CHLORIDE 0.9 % IV SOLN: INTRAVENOUS | Status: DC

## 2023-05-15 MED ORDER — IOHEXOL 300 MG/ML  SOLN
125.0000 mL | Freq: Once | INTRAMUSCULAR | Status: AC | PRN
  Administered 2023-05-15: 125 mL via INTRAVENOUS

## 2023-05-15 NOTE — Telephone Encounter (Signed)
 Requested Prescriptions  Pending Prescriptions Disp Refills   fluticasone  (FLONASE ) 50 MCG/ACT nasal spray [Pharmacy Med Name: FLUTICASONE  50 MCG SPRAY] 16 mL 1    Sig: USE TWO SPRAYS IN EACH NOSTRIL ONE TIME DAILY     Ear, Nose, and Throat: Nasal Preparations - Corticosteroids Failed - 05/15/2023 12:30 PM      Failed - Valid encounter within last 12 months    Recent Outpatient Visits           1 week ago Acute cystitis with hematuria   [facility] Raina Bunting, DO       Future Appointments             In 2 weeks Riddle, Suzann, NP [facility] [place]   In 1 month Stoioff, Kizzie Perks, MD [facility] [place]   In 2 months Verona Goodwill, MD [facility] [place]   In 2 months Romeo Co, Kayleen Party, DO [facility]

## 2023-05-16 ENCOUNTER — Ambulatory Visit

## 2023-05-16 DIAGNOSIS — G8929 Other chronic pain: Secondary | ICD-10-CM

## 2023-05-16 DIAGNOSIS — M6281 Muscle weakness (generalized): Secondary | ICD-10-CM

## 2023-05-18 ENCOUNTER — Other Ambulatory Visit: Payer: Self-pay | Admitting: Student in an Organized Health Care Education/Training Program

## 2023-05-18 DIAGNOSIS — G894 Chronic pain syndrome: Secondary | ICD-10-CM

## 2023-05-18 DIAGNOSIS — M19011 Primary osteoarthritis, right shoulder: Secondary | ICD-10-CM

## 2023-05-18 NOTE — Therapy (Unsigned)
 OUTPATIENT PHYSICAL THERAPY SHOULDER TREATMENT/RECERTIFICATION  Patient Name: [name] MRN: [id number] DOB:1955/05/06, 68 y.o., male Today's Date: 05/21/2023   PT End of Session - 05/21/23 1422     Visit Number 9    Number of Visits 25    Date for PT Re-Evaluation 05/21/23    Authorization Type eval: 02/26/23, [employer] Medicare, [id number] on AUTH    Authorization Time Period [facility] medicare 2024  [id number] on [place]    PT Start Time 1402    PT Stop Time 1445    PT Time Calculation (min) 43 min    Activity Tolerance Patient tolerated treatment well    Behavior During Therapy WFL for tasks assessed/performed             Past Medical History:  Diagnosis Date   (HFpEF) heart failure with preserved ejection fraction (HCC)    a.) TTE 12/26/2013: EF 45-50%, mild ant and antsept HK. mild MR. Mod dil LA. nl RV fxn. Rhythm was Afib; b.) TTE 07/04/2019: EF 55%, mid-apical anteroseptal HK, mild MAC, mild Ao sclerosis, G2DD, RVSP 45.3; c.) TTE 06/02/2022: EF 55-60%, no RWMAs, sev LAE, RVSP 37.9, mild-mod MR, mild AR, AoV sclerosis without stenosis, asc Ao 38 mm   Anxiety    Aortic atherosclerosis (HCC)    Cardiomyopathy (in setting of Afib)    a.) TTE 12/26/2013: EF 45-50%; b.) TTE 07/04/2019: EF 55%; c.) TTE 06/02/2022: EF 55-60%   Chronic pain syndrome    a.) followed by pain management   Chronic, continuous use of opioids    a.) on COT; followed by pain management   Coronary artery disease 11/06/2014   a.) cCTA 11/06/2014: Ca2+ = 224 (74th %ile; LAD distribution)   Diverticulosis    DJD (degenerative joint disease) of knee    Former smoker    History of hiatal hernia    History of kidney stones 2012   Hyperlipidemia    Hyperplastic colon polyp    Hypertension    Inguinal hernia, left    Internal hemorrhoids    Intervertebral disc disorder with radiculopathy of lumbosacral region    Lesion of bone of lumbosacral spine    L5   Long term current use of amiodarone     Lower extremity edema     Mixed hyperlipidemia    Morbid obesity (HCC)    Multiple falls    Myalgia due to statin    On rivaroxaban  therapy    Osteoarthritis    PAF (paroxysmal atrial fibrillation) (HCC)    a.) CHA2DS2VASc = 4 (age, HTN, CHF, vascular disease) as of 02/19/23;  b.) s/p DCCV 03/13/14 (200 J), 05/15/19 (150 J x 1, 200 J x2), 05/19/2022 (150 J x1, 200 J x2; pads changed to ant/lat postion with additional 200 J x 2 -> did not convert), 07/25/22 (200 J), 09/21/2022 (200 J); c.) s/p PVI ablation 11/17/2022; d.) rate/rhythm maintained on amiodarone ; chronically anticoagulated on rivaroxaban    Pernicious anemia    Prostate cancer (HCC)    Pulmonary nodule, right    a. 10/2014 Cardiac CTA: 7mm RLL nodule; b. 04/2015 CT Chest: stable 7mm RLL nodule. No new nodules; 02/2017 CTA Chest: stable, benign, 7mm RLL pulm nodule.   Rotator cuff tear, left    Sleep difficulties    a.) takes melatonin + trazodone  PRN   SVT (supraventricular tachycardia) (HCC)    Tubular adenoma of colon    Past Surgical History:  Procedure Laterality Date   ATRIAL FIBRILLATION ABLATION N/A 11/17/2022   Procedure: ATRIAL  FIBRILLATION ABLATION; Location: [facility]; Surgeon: Gehi, Anil, MD   BICEPT TENODESIS Right 09/27/2021   Procedure: Right reverse shoulder arthroplasty, biceps tenodesis;  Surgeon: Lorri Rota, MD;  Location: [facility];  Service: Orthopedics;  Laterality: Right;   CARDIOVERSION N/A 05/15/2019   Procedure: CARDIOVERSION;  Surgeon: Devorah Fonder, MD;  Location: [facility];  Service: Cardiovascular;  Laterality: N/A;   CARDIOVERSION N/A 03/13/2014   Procedure: CARDIOVERSION; Location: [facility]; Surgeon: Belva Boyden, MD   CARDIOVERSION N/A 03/24/2022   Procedure: CARDIOVERSION;  Surgeon: Devorah Fonder, MD;  Location: [facility];  Service: Cardiovascular;  Laterality: N/A;   CARDIOVERSION N/A 05/19/2022   Procedure: CARDIOVERSION;  Surgeon: Devorah Fonder, MD;  Location: [facility];  Service: Cardiovascular;  Laterality:  N/A;   CARDIOVERSION N/A 07/25/2022   Procedure: CARDIOVERSION;  Surgeon: Verona Goodwill, MD;  Location: [facility];  Service: Cardiovascular;  Laterality: N/A;   CARDIOVERSION N/A 09/21/2022   Procedure: CARDIOVERSION;  Surgeon: Verona Goodwill, MD;  Location: [facility];  Service: Cardiovascular;  Laterality: N/A;   CARPAL TUNNEL RELEASE Right 12/23/2014   Procedure: CARPAL TUNNEL RELEASE;  Surgeon: Marlynn Singer, MD;  Location: [facility];  Service: Orthopedics;  Laterality: Right;   CARPAL TUNNEL RELEASE Left 01/06/2015   Procedure: CARPAL TUNNEL RELEASE;  Surgeon: Marlynn Singer, MD;  Location: [facility];  Service: Orthopedics;  Laterality: Left;   COLONOSCOPY WITH PROPOFOL  N/A 10/08/2017   Procedure: COLONOSCOPY WITH PROPOFOL ;  Surgeon: Selena Daily, MD;  Location: [facility];  Service: Gastroenterology;  Laterality: N/A;   COLONOSCOPY WITH PROPOFOL  N/A 06/07/2022   Procedure: COLONOSCOPY WITH PROPOFOL ;  Surgeon: Selena Daily, MD;  Location: [facility]COPY;  Service: Gastroenterology;  Laterality: N/A;   ENTEROSCOPY  06/07/2022   Procedure: ENTEROSCOPY;  Surgeon: Selena Daily, MD;  Location: [facility]COPY;  Service: Gastroenterology;;   ESOPHAGOGASTRODUODENOSCOPY (EGD) WITH PROPOFOL  N/A 06/07/2022   Procedure: ESOPHAGOGASTRODUODENOSCOPY (EGD) WITH PROPOFOL ;  Surgeon: Selena Daily, MD;  Location: [facility] ENDOSCOPY;  Service: Gastroenterology;  Laterality: N/A;   GASTRIC BYPASS  01/17/2000   KNEE SURGERY Right    knee trauma x3   prostate seeding     REVERSE SHOULDER ARTHROPLASTY Right 09/27/2021   Procedure: Right reverse shoulder arthroplasty, biceps tenodesis;  Surgeon: Lorri Rota, MD;  Location: [facility];  Service: Orthopedics;  Laterality: Right;   REVERSE SHOULDER ARTHROPLASTY Left 02/20/2023   Procedure: Left reverse shoulder arthroplasty, biceps tenodesis;  Surgeon: Lorri Rota, MD;  Location: [facility];  Service: Orthopedics;  Laterality: Left;   SHOULDER  ARTHROSCOPY WITH ROTATOR CUFF REPAIR AND OPEN BICEPS TENODESIS Left 12/27/2021   Procedure: Left shoulder arthroscopic cuff repair (supraspinatus and subscapularis) with Regeneten Patch application;  Surgeon: Lorri Rota, MD;  Location: [facility];  Service: Orthopedics;  Laterality: Left;   Patient Active Problem List   Diagnosis Date Noted   Symptomatic anemia 04/26/2022   Chronic pain of both knees 03/30/2022   Bilateral primary osteoarthritis of knee 03/30/2022   Shortness of breath 03/24/2022   Vitamin D  deficiency 02/24/2022   S/p reverse total shoulder arthroplasty 09/27/2021   Lesion of bone of lumbosacral spine (L5) 05/12/2021   Localized osteoarthritis of shoulder regions, bilateral 03/29/2021   Chronic pain of both shoulders 03/29/2021   Drug-induced myopathy 02/21/2021   Trigger finger of right hand 11/15/2020   Prostate cancer (HCC) 07/26/2020   Insomnia 08/01/2019   Primary osteoarthritis of both wrists 02/17/2019   Trigger middle finger of left hand 02/17/2019   Chronic pain syndrome  02/17/2019   Chronic radicular lumbar pain 10/08/2018   Lumbar radiculopathy 10/08/2018   Lumbar degenerative disc disease 10/08/2018   Lumbar facet arthropathy 10/08/2018   Lumbar facet joint syndrome 10/08/2018   Intervertebral disc disorder with radiculopathy of lumbosacral region    Osteoarthritis of knee 11/30/2017   Osteoarthritis of wrist 11/30/2017   Pernicious anemia 05/22/2016   Persistent atrial fibrillation (HCC) 12/20/2015   Carpal tunnel syndrome 11/16/2014   DJD (degenerative joint disease) of knee 10/27/2014   Bilateral carpal tunnel syndrome 10/27/2014   Morbid obesity with BMI of 45.0-49.9, adult (HCC) 10/27/2014   Mixed hyperlipidemia 10/27/2014   H/O gastric bypass 03/20/2014   Hyperkalemia 03/20/2014   History of prostate cancer 12/26/2013   Essential hypertension 12/26/2013   Encounter for anticoagulation discussion and counseling 12/26/2013    PCP: Dr.  Romeo Co  REFERRING PROVIDER: Dr. Lorri Rota  REFERRING DIAG: [phone] (ICD-10-CM) - Complete rotator cuff tear or rupture of left shoulder, not specified as traumatic   RATIONALE FOR EVALUATION AND TREATMENT: Rehabilitation  THERAPY DIAG: Chronic left shoulder pain  Muscle weakness (generalized)  ONSET DATE: 02/20/23  FOLLOW-UP APPT SCHEDULED WITH REFERRING PROVIDER: Yes   FROM INITIAL EVALUATION SUBJECTIVE:                                                                                                                                                                                         SUBJECTIVE STATEMENT:  L reverse TSR 02/20/23  PERTINENT HISTORY:  GAL LEFTON is a 68 y.o. male who initially underwent arthroscopic subscapularis repair and side-to-side supraspinatus repair with Regeneten patch application by Dr. Lydia Sams 12/27/2021. He underwent post-surgical physical therapy with initial minimal improvement followed by worsening shoulder strength/function. Repeat MRI showed large, retracted subscapularis re-tear and supraspinatus re-tear with signs of rotator cuff arthropathy. Conservative measures including medications, physical therapy, and cortisone injections did not provide adequate relief. The patient elected to proceed with reverse shoulder arthroplasty and biceps tenodesis on 02/20/23. Intraoperative findings include retracted full-thickness subscapularis tear, partial-thickness supraspinatus tear, and biceps tendinopathy. Surgery was a standard deltopectoral incision under general anesthesia along with an interscalene block. Per op note, operative arm to remain in sling at all times except ROM exercises and hygiene. Can perform pendulums, elbow/wrist/hand ROM exercises. Passive ROM allowed to 90 FF and 30 ER. ASA 325mg  x 6 weeks for DVT ppx (pt states that he was actually instructed to start back on his Xarelto  and not to use ASA). Plan for PT starting on POD #3-4. Pt denies any  post-operative complications from surgery. Pt has a history of chronic back pain and has undergone multiple lumbar facet, medial  branch radiofrequency ablations. He also has a past history of cardiomyopathy (in setting of Afib), DJD (degenerative joint disease) of knee, kidney stones, lower extremity edema, PAF (paroxysmal atrial fibrillation), pernicious anemia, prostate cancer, and a pulmonary nodule. He also has a past surgical history that includes R reverse TSR, knee surgery, gastric bypass (2002), prostate seeding, carpal tunnel release (Right, 12/23/2014; Left, 01/06/2015), and multiple cardioversions.   PAIN:  Pain Intensity: No resting pain upon arrival Pain location: L shoulder Pain Quality: "Nerve pain" Radiating: No  Numbness/Tingling: No Focal Weakness: Yes 24-hour pain behavior: Varies History of prior shoulder or neck/shoulder injury, pain, surgery, or therapy: Yes PT for L shoulder s/p RTC repair, history of R reverse TSR; Dominant hand: right Red flags Positive: history of prostate CA, Negative: chills/fever, night sweats, nausea, vomiting, unrelenting pain, unexplained weight gain/loss  PRECAUTIONS: Shoulder, per operative note: operative arm to remain in sling at all times except ROM exercises and hygiene. Can perform pendulums, elbow/wrist/hand ROM exercises. Passive ROM allowed to 90 FF and 30 ER.   General reverse TSR precautions include no combined shoulder extension, IR, and adduction. No shoulder and elbow AROM (bicep tenodesis) for at least the first 4 weeks   WEIGHT BEARING RESTRICTIONS: Yes no WB through LUE  FALLS: Has patient fallen in last 6 months? 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  Recent memory is intact.  Remote memory is intact.  Attention span and concentration are intact.  Expressive speech is intact.  Patient's fund of knowledge is within normal limits for educational level.    Gross Musculoskeletal Assessment Tremor: None Bulk: Normal Tone: Normal Incision is clean and dry with minimal blood staining on honeycomb dressing as well as minimal swelling around the incision. Skin is cool to touch and no signs of infection present.   Gait Deferred full gait assessment  Posture Forward head  with upper thoracic kyphosis and rounded shoulders  Cervical Screen Deferred  ROM ROM (Normal range in degrees)    Right (AROM) Left (PROM)  Shoulder    Flexion 160 90 (limited per protocol)  Extension    Abduction 180 90 (scaption)  External Rotation 88 0  Internal Rotation 70 stomach  Hands Behind Head    Hands Behind Back        Elbow    Flexion Full Full  Extension 0 0  Pronation WNL Full palm up  Supination WNL Full palm down  (* = pain; Blank rows = not tested)  UE MMT: MMT (out of 5) Right Left   Shoulder   Flexion 5   Extension    Abduction 5   External rotation 5   Internal rotation 5   Horizontal abduction    Horizontal adduction    Lower Trapezius    Rhomboids        Elbow  Flexion 5   Extension 5   Pronation 5   Supination 5  Wrist  Flexion 5 5  Extension 5 5  Radial deviation  5  Ulnar deviation  5      MCP  Flexion 5 5  Extension 5 5  Abduction 5 5  Adduction 5 5  (* = pain; Blank rows = not  tested)  Sensation Deferred  Reflexes Deferred  Palpation No tenderness to light palpation around entire L shoulder girdle;  Passive Accessory Intervertebral Motion Deferred  Accessory Motions/Glides Deferred  Muscle Length Testing Deferred  SPECIAL TESTS Deferred  Beighton scale Deferred   TODAY'S TREATMENT: 05/21/23   Subjective: Patient reports no pain on arrival but he is having some soreness in his L shoulder. He had another episode of R shoulder rTSR subluxation/dislocation which spontaneously reduced after approximately 1.5 hours. He has not had any episodes of L shoulder subluxations/dislocations. He saw Dr. Lydia Sams today and reports that his R shoulder alignment on imaging was normal. No specific questions currently.     Ther-ex  UBE x 4 minutes for AAROM and strengthening during interval history (2 minutes unbilled); In semi-recumbent position: L shoulder flexion with 2# dumbbell 3 x 10; L shoulder scaption with 2# DB 3 x 10; L shoulder manually resisted ER 3 x 10; L shoulder manually resisted IR 3 x 10; L elbow manually resisted flexion/extension 3 x 10 each; Dowel chest press with manual resistance from therapist 3 x 10; Nautilus lat pull downs 80# 3 x 10; Ice pack applied to L shoulder at end of session (unbilled);   HOME EXERCISE PROGRAM:  Access Code: [email or web address] URL: [email or web address] Date: 02/28/2023 Prepared by: Crawford Dock  Exercises - Seated Cervical Sidebending Stretch (Mirrored)  - 2 x daily - 7 x weekly - 3 reps - 30s hold - Seated Scapular Retraction  - 2 x daily - 7 x weekly - 2 sets - 10 reps - 3s hold - Circular Shoulder Pendulum with Table Support  - 2 x daily - 7 x weekly - 3 reps - 60s hold - Horizontal Shoulder Pendulum with Table Support  - 2 x daily - 7 x weekly - 3 reps - 60s hold - Flexion-Extension Shoulder Pendulum with Table Support (Mirrored)  - 2 x daily - 7 x weekly - 3 reps - 60s hold - Wrist Flexion  AROM  - 2 x daily - 7 x weekly - 2 sets - 10 reps - 3s hold - Wrist Extension AROM  - 1 x daily - 7 x weekly - 2 sets - 10 reps - 3s hold    ASSESSMENT:  Clinical Impression:   No notable pain reported during therapy session today. Focused on progressive strengthening and utilized the Nautilus machine today. Pt encouraged to continue HEP and follow up as directed. He will continue to benefit from skilled therapy to address remaining deficits in order to improve quality of life and return to PLOF.    OBJECTIVE IMPAIRMENTS: decreased ROM, decreased strength, and pain.   ACTIVITY LIMITATIONS: carrying, lifting, bathing, toileting, dressing, and reach over head  PARTICIPATION LIMITATIONS: meal prep, cleaning, laundry, driving, shopping, community activity, and yard work  PERSONAL FACTORS: Past/current experiences, Time since onset of injury/illness/exacerbation, and 3+ comorbidities: OA, anemia, anxiety, cardiomyopathy, chronic pain  are also affecting patient's functional outcome.   REHAB POTENTIAL: Good  CLINICAL DECISION MAKING: Unstable/unpredictable  EVALUATION COMPLEXITY: High   GOALS: Goals reviewed with patient? Yes  SHORT TERM GOALS: Target date: 04/09/2023  Pt will be independent with HEP to improve strength and decrease shoulder pain to  improve pain-free function at home and work. Baseline:  Goal status: INITIAL   LONG TERM GOALS: Target date: 05/21/2023  1.  Pt will report no further L shoulder pain with all AROM in order to complete all ADLs and improve pain-free function. Baseline:  Goal status: INITIAL  2.  Pt will decrease QuickDASH score by at least 8% in order to demonstrate clinically significant reduction in disability related to shoulder pain        Baseline: 02/26/23: 72.7% Goal status: INITIAL  3. Pt will increase strength of L shoulder flexion and scaption to at least 4/5 in order to demonstrate improvement in strength and function         Baseline:  02/26/23: Unable to test Goal status: INITIAL  4. Pt will improve L shoulder AROM flexion and scaption to at least 110 degrees in order to demonstrate improvement in function so he can take care of his property and complete all household responsibilities;        Baseline: 02/26/23: Unable to test (90 degrees PROM for both) Goal status: INITIAL   PLAN: PT FREQUENCY: 1-2x/week  PT DURATION: 12 weeks  PLANNED INTERVENTIONS: Therapeutic exercises, Therapeutic activity, Neuromuscular re-education, Balance training, Gait training, Patient/Family education, Self Care, Joint mobilization, Joint manipulation, Vestibular training, Canalith repositioning, Orthotic/Fit training, DME instructions, Dry Needling, Electrical stimulation, Spinal manipulation, Spinal mobilization, Cryotherapy, Moist heat, Taping, Traction, Ultrasound, Ionotophoresis 4mg /ml Dexamethasone , Manual therapy, and Re-evaluation.  PLAN FOR NEXT SESSION: progress L shoulder per surgical protocol;  Sherill Ding Quisha Mabie PT, DPT, GCS  2:48 PM,05/21/23

## 2023-05-18 NOTE — Telephone Encounter (Signed)
 Called patient, advised of upcoming appointment to discuss amiodarone  questions.   Patient verbalized understanding.

## 2023-05-21 ENCOUNTER — Ambulatory Visit
Attending: Student in an Organized Health Care Education/Training Program | Admitting: Student in an Organized Health Care Education/Training Program

## 2023-05-21 ENCOUNTER — Ambulatory Visit: Attending: Orthopedic Surgery

## 2023-05-21 VITALS — BP 142/90 | HR 116 | Temp 96.6°F | Resp 16 | Ht 72.0 in | Wt 347.0 lb

## 2023-05-21 DIAGNOSIS — M6281 Muscle weakness (generalized): Secondary | ICD-10-CM | POA: Insufficient documentation

## 2023-05-21 DIAGNOSIS — M47816 Spondylosis without myelopathy or radiculopathy, lumbar region: Secondary | ICD-10-CM | POA: Insufficient documentation

## 2023-05-21 DIAGNOSIS — M19031 Primary osteoarthritis, right wrist: Secondary | ICD-10-CM | POA: Diagnosis not present

## 2023-05-21 DIAGNOSIS — M65332 Trigger finger, left middle finger: Secondary | ICD-10-CM | POA: Insufficient documentation

## 2023-05-21 DIAGNOSIS — M25512 Pain in left shoulder: Secondary | ICD-10-CM | POA: Insufficient documentation

## 2023-05-21 DIAGNOSIS — G8929 Other chronic pain: Secondary | ICD-10-CM | POA: Insufficient documentation

## 2023-05-21 DIAGNOSIS — M19032 Primary osteoarthritis, left wrist: Secondary | ICD-10-CM | POA: Diagnosis not present

## 2023-05-21 DIAGNOSIS — M65331 Trigger finger, right middle finger: Secondary | ICD-10-CM | POA: Insufficient documentation

## 2023-05-21 DIAGNOSIS — M170 Bilateral primary osteoarthritis of knee: Secondary | ICD-10-CM | POA: Diagnosis not present

## 2023-05-21 DIAGNOSIS — G894 Chronic pain syndrome: Secondary | ICD-10-CM | POA: Insufficient documentation

## 2023-05-21 MED ORDER — HYDROCODONE-ACETAMINOPHEN 7.5-325 MG PO TABS: 1.0000 | ORAL_TABLET | Freq: Four times a day (QID) | ORAL | 0 refills | Status: DC | PRN

## 2023-05-21 MED ORDER — HYDROCODONE-ACETAMINOPHEN 7.5-325 MG PO TABS: 1.0000 | ORAL_TABLET | Freq: Four times a day (QID) | ORAL | 0 refills | Status: AC | PRN

## 2023-05-21 NOTE — Progress Notes (Signed)
 Safety precautions to be maintained throughout the outpatient stay will include: orient to surroundings, keep bed in low position, maintain call bell within reach at all times, provide assistance with transfer out of bed and ambulation.   Patient did not bring medication bottle with him today. States he ran out of medication this am. He has tapered his medication  r/t running out. Instructed patient to bring bottle if empty. Patient with understanding.  Patient sick and couldn't make last appt. Currently on antibiotics

## 2023-05-21 NOTE — Progress Notes (Signed)
 PROVIDER NOTE: Interpretation of information contained herein should be left to medically-trained personnel. Specific patient instructions are provided elsewhere under "Patient Instructions" section of medical record. This document was created in part using AI and STT-dictation technology, any transcriptional errors that may result from this process are unintentional.  Patient: [name]  Service: E/M   PCP: Raina Bunting, DO  DOB: Mar 15, 1955  DOS: 05/21/2023  Provider: Cephus Collin, MD  MRN: [id number]  Delivery: Face-to-face  Specialty: Interventional Pain Management  Type: Established Patient  Setting: Ambulatory outpatient facility  Specialty designation: 09  Referring Prov.: Domingo Friend *  Location: Outpatient office facility       HPI  [name], a 68 y.o. year old male, is here today because of his Bilateral primary osteoarthritis of knee [M17.0]. [name] primary complain today is Back Pain  Pertinent problems: [name] has H/O gastric bypass; DJD (degenerative joint disease) of knee; Bilateral carpal tunnel syndrome; Persistent atrial fibrillation (HCC); Chronic radicular lumbar pain; Lumbar radiculopathy; Lumbar degenerative disc disease; Lumbar facet arthropathy; Lumbar facet joint syndrome; Primary osteoarthritis of both wrists; Trigger middle finger of left hand; and Chronic pain syndrome on their pertinent problem list. Pain Assessment: Severity of Chronic pain is reported as a 5 /10. Location: Back Right, Left/buttocks bilateral down back of leg to above knees, right is the worse. Onset: More than a month ago. Quality: Aching, Burning, Constant. Timing: Constant. Modifying factor(s): rest, sitting down,. Vitals:  height is 6' (1.829 m) and weight is 347 lb (157.4 kg) (abnormal). His temperature is 96.6 F (35.9 C) (abnormal). His blood pressure is 142/90 (abnormal) and his pulse is 116 (abnormal). His respiration is 16 and oxygen saturation is 100%.   BMI: Estimated body mass index is 47.06 kg/m as calculated from the following:   Height as of this encounter: 6' (1.829 m).   Weight as of this encounter: 347 lb (157.4 kg). Last encounter: 02/13/2023. Last procedure: 03/28/2023.  Reason for encounter:   Patient with struggling with flank pain and had gross hematuria and had nephrolithiasis as well as kidney function for which she is on antibiotics.  He is here for medication management.  No change in his dose.  UDS up-to-date and reviewed.  Pharmacotherapy Assessment  Analgesic: hydrocodone  7.5 mg 3 times daily as needed     Monitoring:  PMP: PDMP reviewed during this encounter.       Pharmacotherapy: No side-effects or adverse reactions reported. Compliance: No problems identified. Effectiveness: Clinically acceptable.  Sibyl Drafts, RN  05/21/2023  1:00 PM  Sign when Signing Visit Safety precautions to be maintained throughout the outpatient stay will include: orient to surroundings, keep bed in low position, maintain call bell within reach at all times, provide assistance with transfer out of bed and ambulation.   Patient did not bring medication bottle with him today. States he ran out of medication this am. He has tapered his medication  r/t running out. Instructed patient to bring bottle if empty. Patient with understanding.  Patient sick and couldn't make last appt. Currently on antibiotics  No results found for: "CBDTHCR" No results found for: "D8THCCBX" No results found for: "D9THCCBX"  UDS:  Summary  Date Value Ref Range Status  08/15/2022 Note  Final    Comment:    ==================================================================== [facility] (MW) ==================================================================== Test  Result       Flag       Units  Drug Present and Declared for Prescription Verification   Hydrocodone                     5111         EXPECTED   ng/mg  creat   Hydromorphone                   68           EXPECTED   ng/mg creat   Dihydrocodeine                 525          EXPECTED   ng/mg creat   Norhydrocodone                 2358         EXPECTED   ng/mg creat    Sources of hydrocodone  include scheduled prescription medications.    Hydromorphone , dihydrocodeine and norhydrocodone are expected    metabolites of hydrocodone . Hydromorphone  and dihydrocodeine are    also available as scheduled prescription medications.  ==================================================================== Test                      Result    Flag   Units      Ref Range   Creatinine              137              mg/dL      >=40 ==================================================================== Declared Medications:  The flagging and interpretation on this report are based on the  following declared medications.  Unexpected results may arise from  inaccuracies in the declared medications.   **Note: The testing scope of this panel includes these medications:   Hydrocodone  (Norco)   **Note: The testing scope of this panel does not include the  following reported medications:   Acetaminophen  (Norco)  Amiodarone   Benazepril  (Lotensin )  Buspirone  (Buspar )  Cyclobenzaprine  (Flexeril )  Duloxetine  (Cymbalta )  Eye Drop  Fluticasone  (Flonase )  Iron   Melatonin  Metoprolol   Multivitamin  Rivaroxaban  (Xarelto )  Trazodone   Vitamin B12  Vitamin C ==================================================================== For clinical consultation, please call [phone]. ====================================================================       ROS  Constitutional: Denies any fever or chills Gastrointestinal: No reported hemesis, hematochezia, vomiting, or acute GI distress Musculoskeletal: Denies any acute onset joint swelling, redness, loss of ROM, or weakness Neurological: No reported episodes of acute onset apraxia, aphasia, dysarthria, agnosia,  amnesia, paralysis, loss of coordination, or loss of consciousness  Medication Review  DULoxetine , Ferrous Bisglycinate Chelate, HYDROcodone -acetaminophen , amLODipine , amiodarone , benazepril , busPIRone , carboxymethylcellulose, cyanocobalamin , cyclobenzaprine , fluticasone , melatonin, multivitamin with minerals, rivaroxaban , sulfamethoxazole -trimethoprim , and traZODone   History Review  Allergy: [name] is allergic to bupivacaine  liposome. Drug: [name]  reports no history of drug use. Alcohol :  reports that he does not currently use alcohol . Tobacco:  reports that he quit smoking about 44 years ago. His smoking use included cigarettes. He started smoking about 54 years ago. He has a 10 pack-year smoking history. He has quit using smokeless tobacco.  His smokeless tobacco use included snuff. Social: [name]  reports that he quit smoking about 44 years ago. His smoking use included cigarettes. He started smoking about 54 years ago. He has a 10 pack-year smoking history. He has quit using smokeless tobacco.  His smokeless tobacco use included snuff. He reports that he  does not currently use alcohol . He reports that he does not use drugs. Medical:  has a past medical history of (HFpEF) heart failure with preserved ejection fraction (HCC), Anxiety, Aortic atherosclerosis (HCC), Cardiomyopathy (in setting of Afib), Chronic pain syndrome, Chronic, continuous use of opioids, Coronary artery disease (11/06/2014), Diverticulosis, DJD (degenerative joint disease) of knee, Former smoker, History of hiatal hernia, History of kidney stones (2012), Hyperlipidemia, Hyperplastic colon polyp, Hypertension, Inguinal hernia, left, Internal hemorrhoids, Intervertebral disc disorder with radiculopathy of lumbosacral region, Lesion of bone of lumbosacral spine, Long term current use of amiodarone , Lower extremity edema, Mixed hyperlipidemia, Morbid obesity (HCC), Multiple falls, Myalgia due to statin, On rivaroxaban   therapy, Osteoarthritis, PAF (paroxysmal atrial fibrillation) (HCC), Pernicious anemia, Prostate cancer (HCC), Pulmonary nodule, right, Rotator cuff tear, left, Sleep difficulties, SVT (supraventricular tachycardia) (HCC), and Tubular adenoma of colon. Surgical: [name]  has a past surgical history that includes Knee surgery (Right); Gastric bypass (01/17/2000); prostate seeding; Carpal tunnel release (Right, 12/23/2014); Carpal tunnel release (Left, 01/06/2015); Colonoscopy with propofol  (N/A, 10/08/2017); Cardioversion (N/A, 05/15/2019); Cardioversion (N/A, 03/13/2014); Reverse shoulder arthroplasty (Right, 09/27/2021); Bicept tenodesis (Right, 09/27/2021); Shoulder arthroscopy with rotator cuff repair and open biceps tenodesis (Left, 12/27/2021); Cardioversion (N/A, 03/24/2022); Cardioversion (N/A, 05/19/2022); Colonoscopy with propofol  (N/A, 06/07/2022); Esophagogastroduodenoscopy (egd) with propofol  (N/A, 06/07/2022); enteroscopy (06/07/2022); Cardioversion (N/A, 07/25/2022); Cardioversion (N/A, 09/21/2022); Atrial fibrillation ablation (N/A, 11/17/2022); and Reverse shoulder arthroplasty (Left, 02/20/2023). Family: family history includes Arrhythmia in his brother and sister; Cancer in his brother, father, and sister; Heart disease in his father; Hypertension in his father; Stroke in his father.  Laboratory Chemistry Profile   Renal Lab Results  Component Value Date   BUN 13 02/28/2023   CREATININE 1.00 02/28/2023   BCR 12 08/31/2022   GFRAA 72 07/21/2020   GFRNONAA >60 02/28/2023    Hepatic Lab Results  Component Value Date   AST 18 02/12/2023   ALT 15 02/12/2023   ALBUMIN 4.2 02/12/2023   ALKPHOS 71 02/12/2023   LIPASE 28 03/13/2017    Electrolytes Lab Results  Component Value Date   NA 139 02/28/2023   K 4.2 02/28/2023   CL 105 02/28/2023   CALCIUM  8.4 (L) 02/28/2023   MG 2.0 02/01/2021    Bone Lab Results  Component Value Date   VD25OH 37.90 10/30/2022   25OHVITD1 32  02/01/2021   25OHVITD2 <1.0 02/01/2021   25OHVITD3 32 02/01/2021    Inflammation (CRP: Acute Phase) (ESR: Chronic Phase) Lab Results  Component Value Date   CRP 1.7 02/09/2022   ESRSEDRATE 6 02/09/2022         Note: Above Lab results reviewed.  Recent Imaging Review  CT HEMATURIA WORKUP CLINICAL DATA:  Gross hematuria.  History of prostate cancer.  EXAM: CT ABDOMEN AND PELVIS WITHOUT AND WITH CONTRAST  TECHNIQUE: Multidetector CT imaging of the abdomen and pelvis was performed following the standard protocol before and following the bolus administration of intravenous contrast.  RADIATION DOSE REDUCTION: This exam was performed according to the departmental dose-optimization program which includes automated exposure control, adjustment of the mA and/or kV according to patient size and/or use of iterative reconstruction technique.  CONTRAST:  OMNIPAQUE  IOHEXOL  300 MG/ML  SOLN  COMPARISON:  Abdomen and pelvis CT 02/20/2022  FINDINGS: Lower chest: 7 mm right lower lobe pulmonary nodule unchanged since prior consistent with benign etiology. No suspicious pulmonary nodule or mass at the left lung base.  Hepatobiliary: No suspicious focal abnormality within the liver parenchyma. Multiple calcified gallstones evident.  No gallbladder wall thickening or pericholecystic fluid. No intrahepatic or extrahepatic biliary dilation.  Pancreas: No focal mass lesion. No dilatation of the main duct. No intraparenchymal cyst. No peripancreatic edema.  Spleen: No splenomegaly. No suspicious focal mass lesion.  Adrenals/Urinary Tract: No adrenal nodule or mass.  Precontrast imaging shows no stones in the right kidney. Punctate 1 mm nonobstructing stone identified lower pole left kidney no ureteral or bladder stones.  Imaging after IV contrast administration shows no overtly suspicious enhancing mass lesion in either kidney. 13 mm exophytic lesion upper pole left kidney has  attenuation too high to be a simple cyst on precontrast imaging but shows no enhancement after IV contrast administration compatible with a Bosniak II cyst.  Delayed post-contrast imaging shows no wall thickening or soft tissue filling defect in either intrarenal collecting system or renal pelvis. Neither ureter completely opacified on delayed imaging, but there is no focal hydroureter, ureteral wall thickening, or evidence of soft tissue mass along the course of either ureter. Delayed imaging of the bladder shows no focal wall thickening or mass lesion.  Stomach/Bowel: Sequelae of gastric bypass noted in the stomach. No small bowel wall thickening. The soft tissue fullness in the region of the distal anastomosis previously is not evident today. No small bowel dilatation. The terminal ileum is normal. The appendix is not well visualized, but there is no edema or inflammation in the region of the cecal tip to suggest appendicitis. No gross colonic mass. No colonic wall thickening. Diverticular changes are noted in the left colon without evidence of diverticulitis.  Vascular/Lymphatic: There is moderate atherosclerotic calcification of the abdominal aorta without aneurysm. There is no gastrohepatic or hepatoduodenal ligament lymphadenopathy. No retroperitoneal or mesenteric lymphadenopathy. No pelvic sidewall lymphadenopathy.  Reproductive: Brachytherapy seeds are seen in the prostate gland.  Other: No intraperitoneal free fluid. Irregular soft tissue density is seen in the small bowel mesentery of the central abdomen, in close proximity to the distal anastomosis. This is been stable since the prior study and also comparing back to an exam from 03/13/2017 suggesting scarring from the gastric bypass procedure in small bowel anastomosis.  Musculoskeletal: Small left groin hernia contains only fat. Sclerotic lesion in the left iliac bone stable since 2019 consistent with bone island.  Small sclerotic foci in the sacrum are minimally increased since 2019 and a 8 mm sclerotic lesion in the right sacral ala is new since 2019 but stable since the 2024 exam. Bilateral pars interarticularis defects noted at L5.  IMPRESSION: 1. Punctate 1 mm nonobstructing stone lower pole left kidney. No ureteral or bladder stones. 2. No overtly suspicious enhancing mass lesion in either kidney. 13 mm exophytic lesion upper pole left kidney has attenuation too high to be a simple cyst on precontrast imaging but shows no enhancement after IV contrast administration compatible with a Bosniak II cyst. 3. Cholelithiasis. 4. Left colonic diverticulosis without diverticulitis. 5. Small left groin hernia contains only fat. 6. Small sclerotic foci in the sacrum are minimally increased since 2019 and a 8 mm sclerotic lesion in the right sacral ala is new since 2019 but stable since the 2024 exam. Given history of prostate cancer, continued attention on follow-up recommended. 7.  Aortic Atherosclerosis (ICD10-I70.0).  Electronically Signed   By: Donnal Fusi M.D.   On: 05/15/2023 11:49 Note: Reviewed        Physical Exam  General appearance: Well nourished, well developed, and well hydrated. In no apparent acute distress Mental status: Alert, oriented x 3 (  person, place, & time)       Respiratory: No evidence of acute respiratory distress Eyes: PERLA Vitals: BP (!) 142/90   Pulse (!) 116   Temp (!) 96.6 F (35.9 C)   Resp 16   Ht 6' (1.829 m)   Wt (!) 347 lb (157.4 kg)   SpO2 100%   BMI 47.06 kg/m  BMI: Estimated body mass index is 47.06 kg/m as calculated from the following:   Height as of this encounter: 6' (1.829 m).   Weight as of this encounter: 347 lb (157.4 kg). Ideal: Ideal body weight: 77.6 kg (171 lb 1.2 oz) Adjusted ideal body weight: 109.5 kg (241 lb 7.1 oz)  Assessment   Diagnosis Status  1. Bilateral primary osteoarthritis of knee   2. Chronic pain syndrome   3.  Trigger middle finger of right hand   4. Primary osteoarthritis of both wrists (Right>left)   5. Trigger middle finger of left hand   6. Lumbar facet arthropathy    Controlled Controlled Controlled   Updated Problems: No problems updated.  Plan of Care  Follow-up as scheduled for procedure next week Medication management as below Pharmacotherapy (Medications Ordered): Meds ordered this encounter  Medications   HYDROcodone -acetaminophen  (NORCO) 7.5-325 MG tablet    Sig: Take 1 tablet by mouth every 6 (six) hours as needed for moderate pain (pain score 4-6).    Dispense:  120 tablet    Refill:  0   HYDROcodone -acetaminophen  (NORCO) 7.5-325 MG tablet    Sig: Take 1 tablet by mouth every 6 (six) hours as needed for moderate pain (pain score 4-6).    Dispense:  120 tablet    Refill:  0   HYDROcodone -acetaminophen  (NORCO) 7.5-325 MG tablet    Sig: Take 1 tablet by mouth every 6 (six) hours as needed for moderate pain (pain score 4-6).    Dispense:  120 tablet    Refill:  0   Orders:  No orders of the defined types were placed in this encounter.  Follow-up plan:   Return in about 3 months (around 08/21/2023) for Marthe Slain, MM.     s/p lesi #1 (left L4/5) on 8/17, #2 on 10/21/2018 (left L3/4), #3 on 12/09/2018 (Left L3/4); 02/24/2019: Right wrist injection and left trigger finger (middle finger) injection.,  Bilateral wrist injection 10/15/2019, bilateral knee Hyalgan No. 1 01/26/1960. #2 02/23/2020, #3 03/24/2020.  Endorsing approximately 75 to 80% pain relief for bilateral knee pain.  Lumbar L3-5 RFA, Left: 02/16/21, Right 03/09/21, 01/02/22       Recent Visits Date Type Provider Dept  03/28/23 Procedure visit Cephus Collin, MD [facility]  Showing recent visits within past 90 days and meeting all other requirements Today's Visits Date Type Provider Dept  05/21/23 Office Visit Cephus Collin, MD [facility]  Showing today's visits and meeting all other  requirements Future Appointments Date Type Provider Dept  05/30/23 Appointment Cephus Collin, MD [facility]  Showing future appointments within next 90 days and meeting all other requirements  I discussed the assessment and treatment plan with the patient. The patient was provided an opportunity to ask questions and all were answered. The patient agreed with the plan and demonstrated an understanding of the instructions.  Patient advised to call back or seek an in-person evaluation if the symptoms or condition worsens.  Duration of encounter: .  Total time on encounter, as per AMA guidelines included both the face-to-face and non-face-to-face time personally spent by the physician  and/or other qualified health care professional(s) on the day of the encounter (includes time in activities that require the physician or other qualified health care professional and does not include time in activities normally performed by clinical staff). Physician's time may include the following activities when performed: Preparing to see the patient (e.g., pre-charting review of records, searching for previously ordered imaging, lab work, and nerve conduction tests) Review of prior analgesic pharmacotherapies. Reviewing PMP Interpreting ordered tests (e.g., lab work, imaging, nerve conduction tests) Performing post-procedure evaluations, including interpretation of diagnostic procedures Obtaining and/or reviewing separately obtained history Performing a medically appropriate examination and/or evaluation Counseling and educating the patient/family/caregiver Ordering medications, tests, or procedures Referring and communicating with other health care professionals (when not separately reported) Documenting clinical information in the electronic or other health record Independently interpreting results (not separately reported) and communicating results to the patient/ family/caregiver Care  coordination (not separately reported)  Note by: Cephus Collin, MD (TTS and AI technology used. I apologize for any typographical errors that were not detected and corrected.) Date: 05/21/2023; Time: 1:40 PM

## 2023-05-23 ENCOUNTER — Ambulatory Visit

## 2023-05-24 ENCOUNTER — Ambulatory Visit: Payer: Medicare Other | Admitting: Internal Medicine

## 2023-05-28 ENCOUNTER — Ambulatory Visit

## 2023-05-28 DIAGNOSIS — M6281 Muscle weakness (generalized): Secondary | ICD-10-CM | POA: Diagnosis not present

## 2023-05-28 DIAGNOSIS — G8929 Other chronic pain: Secondary | ICD-10-CM

## 2023-05-28 DIAGNOSIS — M25512 Pain in left shoulder: Secondary | ICD-10-CM | POA: Diagnosis not present

## 2023-05-28 NOTE — Therapy (Signed)
 OUTPATIENT PHYSICAL THERAPY SHOULDER TREATMENT/PROGRESS NOTE/RECERTIFICATION  Dates of reporting period  02/26/23   to   05/28/23   Patient Name: [name] MRN: [id number] DOB:12/08/1955, 68 y.o., male Today's Date: 05/30/2023   PT End of Session - 05/30/23 0829     Visit Number 10    Number of Visits 25    Date for PT Re-Evaluation 08/20/23    Authorization Type eval: 02/26/23, [employer] Medicare, [id number] on AUTH    Authorization Time Period [facility] medicare 2024  [id number] on [place]    Activity Tolerance Patient tolerated treatment well    Behavior During Therapy [facility] for tasks assessed/performed             Past Medical History:  Diagnosis Date   (HFpEF) heart failure with preserved ejection fraction (HCC)    a.) TTE 12/26/2013: EF 45-50%, mild ant and antsept HK. mild MR. Mod dil LA. nl RV fxn. Rhythm was Afib; b.) TTE 07/04/2019: EF 55%, mid-apical anteroseptal HK, mild MAC, mild Ao sclerosis, G2DD, RVSP 45.3; c.) TTE 06/02/2022: EF 55-60%, no RWMAs, sev LAE, RVSP 37.9, mild-mod MR, mild AR, AoV sclerosis without stenosis, asc Ao 38 mm   Anxiety    Aortic atherosclerosis (HCC)    Cardiomyopathy (in setting of Afib)    a.) TTE 12/26/2013: EF 45-50%; b.) TTE 07/04/2019: EF 55%; c.) TTE 06/02/2022: EF 55-60%   Chronic pain syndrome    a.) followed by pain management   Chronic, continuous use of opioids    a.) on COT; followed by pain management   Coronary artery disease 11/06/2014   a.) cCTA 11/06/2014: Ca2+ = 224 (74th %ile; LAD distribution)   Diverticulosis    DJD (degenerative joint disease) of knee    Former smoker    History of hiatal hernia    History of kidney stones 2012   Hyperlipidemia    Hyperplastic colon polyp    Hypertension    Inguinal hernia, left    Internal hemorrhoids    Intervertebral disc disorder with radiculopathy of lumbosacral region    Lesion of bone of lumbosacral spine    L5   Long term current use of amiodarone     Lower extremity edema    Mixed  hyperlipidemia    Morbid obesity (HCC)    Multiple falls    Myalgia due to statin    On rivaroxaban  therapy    Osteoarthritis    PAF (paroxysmal atrial fibrillation) (HCC)    a.) CHA2DS2VASc = 4 (age, HTN, CHF, vascular disease) as of 02/19/23;  b.) s/p DCCV 03/13/14 (200 J), 05/15/19 (150 J x 1, 200 J x2), 05/19/2022 (150 J x1, 200 J x2; pads changed to ant/lat postion with additional 200 J x 2 -> did not convert), 07/25/22 (200 J), 09/21/2022 (200 J); c.) s/p PVI ablation 11/17/2022; d.) rate/rhythm maintained on amiodarone ; chronically anticoagulated on rivaroxaban    Pernicious anemia    Prostate cancer (HCC)    Pulmonary nodule, right    a. 10/2014 Cardiac CTA: 7mm RLL nodule; b. 04/2015 CT Chest: stable 7mm RLL nodule. No new nodules; 02/2017 CTA Chest: stable, benign, 7mm RLL pulm nodule.   Rotator cuff tear, left    Sleep difficulties    a.) takes melatonin + trazodone  PRN   SVT (supraventricular tachycardia) (HCC)    Tubular adenoma of colon    Past Surgical History:  Procedure Laterality Date   ATRIAL FIBRILLATION ABLATION N/A 11/17/2022   Procedure: ATRIAL FIBRILLATION ABLATION; Location: [facility]; Surgeon: Gehi, Anil, MD  BICEPT TENODESIS Right 09/27/2021   Procedure: Right reverse shoulder arthroplasty, biceps tenodesis;  Surgeon: Lorri Rota, MD;  Location: [facility];  Service: Orthopedics;  Laterality: Right;   CARDIOVERSION N/A 05/15/2019   Procedure: CARDIOVERSION;  Surgeon: Devorah Fonder, MD;  Location: [facility];  Service: Cardiovascular;  Laterality: N/A;   CARDIOVERSION N/A 03/13/2014   Procedure: CARDIOVERSION; Location: [facility]; Surgeon: Belva Boyden, MD   CARDIOVERSION N/A 03/24/2022   Procedure: CARDIOVERSION;  Surgeon: Devorah Fonder, MD;  Location: [facility];  Service: Cardiovascular;  Laterality: N/A;   CARDIOVERSION N/A 05/19/2022   Procedure: CARDIOVERSION;  Surgeon: Devorah Fonder, MD;  Location: [facility];  Service: Cardiovascular;  Laterality: N/A;    CARDIOVERSION N/A 07/25/2022   Procedure: CARDIOVERSION;  Surgeon: Verona Goodwill, MD;  Location: [facility];  Service: Cardiovascular;  Laterality: N/A;   CARDIOVERSION N/A 09/21/2022   Procedure: CARDIOVERSION;  Surgeon: Verona Goodwill, MD;  Location: [facility];  Service: Cardiovascular;  Laterality: N/A;   CARPAL TUNNEL RELEASE Right 12/23/2014   Procedure: CARPAL TUNNEL RELEASE;  Surgeon: Marlynn Singer, MD;  Location: [facility];  Service: Orthopedics;  Laterality: Right;   CARPAL TUNNEL RELEASE Left 01/06/2015   Procedure: CARPAL TUNNEL RELEASE;  Surgeon: Marlynn Singer, MD;  Location: [facility];  Service: Orthopedics;  Laterality: Left;   COLONOSCOPY WITH PROPOFOL  N/A 10/08/2017   Procedure: COLONOSCOPY WITH PROPOFOL ;  Surgeon: Selena Daily, MD;  Location: [facility];  Service: Gastroenterology;  Laterality: N/A;   COLONOSCOPY WITH PROPOFOL  N/A 06/07/2022   Procedure: COLONOSCOPY WITH PROPOFOL ;  Surgeon: Selena Daily, MD;  Location: [facility];  Service: Gastroenterology;  Laterality: N/A;   ENTEROSCOPY  06/07/2022   Procedure: ENTEROSCOPY;  Surgeon: Selena Daily, MD;  Location: [facility];  Service: Gastroenterology;;   ESOPHAGOGASTRODUODENOSCOPY (EGD) WITH PROPOFOL  N/A 06/07/2022   Procedure: ESOPHAGOGASTRODUODENOSCOPY (EGD) WITH PROPOFOL ;  Surgeon: Selena Daily, MD;  Location: [facility]COPY;  Service: Gastroenterology;  Laterality: N/A;   GASTRIC BYPASS  01/17/2000   KNEE SURGERY Right    knee trauma x3   prostate seeding     REVERSE SHOULDER ARTHROPLASTY Right 09/27/2021   Procedure: Right reverse shoulder arthroplasty, biceps tenodesis;  Surgeon: Lorri Rota, MD;  Location: [facility];  Service: Orthopedics;  Laterality: Right;   REVERSE SHOULDER ARTHROPLASTY Left 02/20/2023   Procedure: Left reverse shoulder arthroplasty, biceps tenodesis;  Surgeon: Lorri Rota, MD;  Location: [facility];  Service: Orthopedics;  Laterality: Left;   SHOULDER  ARTHROSCOPY WITH ROTATOR CUFF REPAIR AND OPEN BICEPS TENODESIS Left 12/27/2021   Procedure: Left shoulder arthroscopic cuff repair (supraspinatus and subscapularis) with Regeneten Patch application;  Surgeon: Lorri Rota, MD;  Location: [facility];  Service: Orthopedics;  Laterality: Left;   Patient Active Problem List   Diagnosis Date Noted   Symptomatic anemia 04/26/2022   Chronic pain of both knees 03/30/2022   Bilateral primary osteoarthritis of knee 03/30/2022   Shortness of breath 03/24/2022   Vitamin D  deficiency 02/24/2022   S/p reverse total shoulder arthroplasty 09/27/2021   Lesion of bone of lumbosacral spine (L5) 05/12/2021   Localized osteoarthritis of shoulder regions, bilateral 03/29/2021   Chronic pain of both shoulders 03/29/2021   Drug-induced myopathy 02/21/2021   Trigger finger of right hand 11/15/2020   Prostate cancer (HCC) 07/26/2020   Insomnia 08/01/2019   Primary osteoarthritis of both wrists 02/17/2019   Trigger middle finger of left hand 02/17/2019   Chronic pain syndrome 02/17/2019   Chronic radicular lumbar pain 10/08/2018  Lumbar radiculopathy 10/08/2018   Lumbar degenerative disc disease 10/08/2018   Lumbar facet arthropathy 10/08/2018   Lumbar facet joint syndrome 10/08/2018   Intervertebral disc disorder with radiculopathy of lumbosacral region    Osteoarthritis of knee 11/30/2017   Osteoarthritis of wrist 11/30/2017   Pernicious anemia 05/22/2016   Persistent atrial fibrillation (HCC) 12/20/2015   Carpal tunnel syndrome 11/16/2014   DJD (degenerative joint disease) of knee 10/27/2014   Bilateral carpal tunnel syndrome 10/27/2014   Morbid obesity with BMI of 45.0-49.9, adult (HCC) 10/27/2014   Mixed hyperlipidemia 10/27/2014   H/O gastric bypass 03/20/2014   Hyperkalemia 03/20/2014   History of prostate cancer 12/26/2013   Essential hypertension 12/26/2013   Encounter for anticoagulation discussion and counseling 12/26/2013    PCP: Dr.  Romeo Co  REFERRING PROVIDER: Dr. Lorri Rota  REFERRING DIAG: [id number] (ICD-10-CM) - Complete rotator cuff tear or rupture of left shoulder, not specified as traumatic   RATIONALE FOR EVALUATION AND TREATMENT: Rehabilitation  THERAPY DIAG: Chronic left shoulder pain - Plan: PT plan of care cert/re-cert  Muscle weakness (generalized) - Plan: PT plan of care cert/re-cert  ONSET DATE: 02/20/23  FOLLOW-UP APPT SCHEDULED WITH REFERRING PROVIDER: Yes   FROM INITIAL EVALUATION SUBJECTIVE:                                                                                                                                                                                         SUBJECTIVE STATEMENT:  L reverse TSR 02/20/23  PERTINENT HISTORY:  LARODERICK BRAUCH is a 68 y.o. male who initially underwent arthroscopic subscapularis repair and side-to-side supraspinatus repair with Regeneten patch application by Dr. Lydia Sams 12/27/2021. He underwent post-surgical physical therapy with initial minimal improvement followed by worsening shoulder strength/function. Repeat MRI showed large, retracted subscapularis re-tear and supraspinatus re-tear with signs of rotator cuff arthropathy. Conservative measures including medications, physical therapy, and cortisone injections did not provide adequate relief. The patient elected to proceed with reverse shoulder arthroplasty and biceps tenodesis on 02/20/23. Intraoperative findings include retracted full-thickness subscapularis tear, partial-thickness supraspinatus tear, and biceps tendinopathy. Surgery was a standard deltopectoral incision under general anesthesia along with an interscalene block. Per op note, operative arm to remain in sling at all times except ROM exercises and hygiene. Can perform pendulums, elbow/wrist/hand ROM exercises. Passive ROM allowed to 90 FF and 30 ER. ASA 325mg  x 6 weeks for DVT ppx (pt states that he was actually instructed to start back on his Xarelto   and not to use ASA). Plan for PT starting on POD #3-4. Pt denies any post-operative complications from surgery. Pt has a history of chronic back pain and has undergone  multiple lumbar facet, medial branch radiofrequency ablations. He also has a past history of cardiomyopathy (in setting of Afib), DJD (degenerative joint disease) of knee, kidney stones, lower extremity edema, PAF (paroxysmal atrial fibrillation), pernicious anemia, prostate cancer, and a pulmonary nodule. He also has a past surgical history that includes R reverse TSR, knee surgery, gastric bypass (2002), prostate seeding, carpal tunnel release (Right, 12/23/2014; Left, 01/06/2015), and multiple cardioversions.   PAIN:  Pain Intensity: No resting pain upon arrival Pain location: L shoulder Pain Quality: "Nerve pain" Radiating: No  Numbness/Tingling: No Focal Weakness: Yes 24-hour pain behavior: Varies History of prior shoulder or neck/shoulder injury, pain, surgery, or therapy: Yes PT for L shoulder s/p RTC repair, history of R reverse TSR; Dominant hand: right Red flags Positive: history of prostate CA, Negative: chills/fever, night sweats, nausea, vomiting, unrelenting pain, unexplained weight gain/loss  PRECAUTIONS: Shoulder, per operative note: operative arm to remain in sling at all times except ROM exercises and hygiene. Can perform pendulums, elbow/wrist/hand ROM exercises. Passive ROM allowed to 90 FF and 30 ER.   General reverse TSR precautions include no combined shoulder extension, IR, and adduction. No shoulder and elbow AROM (bicep tenodesis) for at least the first 4 weeks   WEIGHT BEARING RESTRICTIONS: Yes no WB through LUE  FALLS: Has patient fallen in last 6 months? 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  Recent memory is intact.  Remote memory is intact.  Attention span and concentration are intact.  Expressive speech is intact.  Patient's fund of knowledge is within normal limits for educational level.    Gross Musculoskeletal Assessment Tremor: None Bulk: Normal Tone: Normal Incision is clean and dry with minimal blood staining on honeycomb dressing as well as minimal swelling around the incision. Skin is cool to touch and no signs of infection present.   Gait Deferred full gait assessment  Posture Forward head  with upper thoracic kyphosis and rounded shoulders  Cervical Screen Deferred  ROM ROM (Normal range in degrees)    Right (AROM) Left (PROM)  Shoulder    Flexion 160 90 (limited per protocol)  Extension    Abduction 180 90 (scaption)  External Rotation 88 0  Internal Rotation 70 stomach  Hands Behind Head    Hands Behind Back        Elbow    Flexion Full Full  Extension 0 0  Pronation WNL Full palm up  Supination WNL Full palm down  (* = pain; Blank rows = not tested)  UE MMT: MMT (out of 5) Right Left   Shoulder   Flexion 5   Extension    Abduction 5   External rotation 5   Internal rotation 5   Horizontal abduction    Horizontal adduction    Lower Trapezius    Rhomboids        Elbow  Flexion 5   Extension 5   Pronation 5  Supination 5       Wrist  Flexion 5 5  Extension 5 5  Radial deviation  5  Ulnar deviation  5      MCP  Flexion 5 5  Extension 5 5  Abduction 5 5  Adduction 5 5  (* = pain;  Blank rows = not tested)  Sensation Deferred  Reflexes Deferred  Palpation No tenderness to light palpation around entire L shoulder girdle;  Passive Accessory Intervertebral Motion Deferred  Accessory Motions/Glides Deferred  Muscle Length Testing Deferred  SPECIAL TESTS Deferred  Beighton scale Deferred   TODAY'S TREATMENT: 05/21/23   Subjective: Patient reports no pain on arrival but ongoing L shoulder stiffness/soreness. Worst pain has increased to 7/10 in the L shoulder this week. He has an appointment with Dr. Rhesa Celeste later this week for lumbar RFA. No more episodes of L shoulder instability/subluxation. No specific questions currently.    PAIN: soreness/stiffness but no resting pain;    Ther-ex  UBE x 4 minutes for AAROM and strengthening during interval history (2 minutes unbilled); Updated outcome measures with patient during visit today: Vitals: BP: 156/82 mmHg, HR: 92, SpO2: 97%; QuickDASH: 40.9%; L shoulder pain: worst: 7/10; L shoulder strength: flexion: 4+/5, pain free, scaption: 4/5, pain L shoulder AROM against gravity: flexion: 176, no pain scaption: 130, pain/burning   In semi-recumbent position: L shoulder flexion with 3# dumbbell 2 x 10; L shoulder scaption with 3# DB 2 x 10; L shoulder manually resisted ER 2 x 10; L shoulder manually resisted IR 2 x 10; L elbow manually resisted flexion/extension 2 x 10 each; L forearm manually resisted pronation/supination 2 x 10 each; Dowel chest press with manual resistance from therapist 2 x 10; Nautilus lat pull downs 80# 2 x 10; Nautilus rows 40# 2 x 10; Ice pack applied to L shoulder at end of session (unbilled);   HOME EXERCISE PROGRAM:  Access Code: [email or web address] URL: [email or web address] Date: 02/28/2023 Prepared by: Crawford Dock  Exercises - Seated Cervical Sidebending Stretch (Mirrored)  - 2 x daily - 7 x weekly - 3 reps - 30s hold - Seated Scapular Retraction  - 2 x daily -  7 x weekly - 2 sets - 10 reps - 3s hold - Circular Shoulder Pendulum with Table Support  - 2 x daily - 7 x weekly - 3 reps - 60s hold - Horizontal Shoulder Pendulum with Table Support  - 2 x daily - 7 x weekly - 3 reps - 60s hold - Flexion-Extension Shoulder Pendulum with Table Support (Mirrored)  - 2 x daily - 7 x weekly - 3 reps - 60s hold - Wrist Flexion AROM  - 2 x daily - 7 x weekly - 2 sets - 10 reps - 3s hold - Wrist Extension AROM  - 1 x daily - 7 x weekly - 2 sets - 10 reps - 3s hold    ASSESSMENT: Updated outcome measures and goals with patient today. His AROM against gravity has improved considerably however he continues to report pain/burning at end scaption. His strength for flexion and scaption have improved to 4+/5 and 4/5 respectively however he again reports pain with resisted scaption. Unfortunately his worst L shoulder pain has increased to 7/10 over the course of the last week. Considerable improvement noted in QuickDASH from 72.7% at the initial evaluation to 40.9% today. Goal revised to decrease QuickDASH to below 30% in order to demonstrate reduction in perceived functional disability related to L shoulder rTSR. No notable pain  reported during exercises today but muscle fatigue observed with failure toward the end of each set. He was able to increase dumbbells from 2# to 3# today. Pt encouraged to continue HEP and follow up as directed. Will update HEP at next session. He will continue to benefit from skilled therapy to address remaining deficits in order to improve quality of life and return to PLOF.    OBJECTIVE IMPAIRMENTS: decreased ROM, decreased strength, and pain.   ACTIVITY LIMITATIONS: carrying, lifting, bathing, toileting, dressing, and reach over head  PARTICIPATION LIMITATIONS: meal prep, cleaning, laundry, driving, shopping, community activity, and yard work  PERSONAL FACTORS: Past/current experiences, Time since onset of injury/illness/exacerbation, and 3+  comorbidities: OA, anemia, anxiety, cardiomyopathy, chronic pain are also affecting patient's functional outcome.   REHAB POTENTIAL: Good  CLINICAL DECISION MAKING: Unstable/unpredictable  EVALUATION COMPLEXITY: High   GOALS: Goals reviewed with patient? Yes  SHORT TERM GOALS: Target date: 07/09/2023   Pt will be independent with HEP to improve strength and decrease shoulder pain to improve pain-free function at home and work. Baseline: Issued and progressing Goal status: ONGOING   LONG TERM GOALS: Target date: 08/20/2023  1.  Pt will report no further L shoulder pain with all AROM in order to complete all ADLs and improve pain-free function. Baseline: 05/28/23: worst pain 7/10; Goal status: ONGOING  2.  Pt will decrease QuickDASH score to below 30% in order to demonstrate clinically significant reduction in disability related to shoulder pain        Baseline: 02/26/23: 72.7%; 05/28/23: 40.9% Goal status: REVISED  3. Pt will increase strength of L shoulder flexion and scaption to at least 4/5 in order to demonstrate improvement in strength and function         Baseline: 02/26/23: Unable to test; 05/28/23: flexion: 4+/5, pain free, scaption: 4/5, pain Goal status: ACHIEVED  4. Pt will improve L shoulder AROM flexion and scaption to at least 110 degrees in order to demonstrate improvement in function so he can take care of his property and complete all household responsibilities;        Baseline: 02/26/23: Unable to test (90 degrees PROM for both), 05/28/23: L shoulder AROM against gravity: flexion: 176, no pain, scaption: 130, pain/burning Goal status: ACHIEVED   PLAN: PT FREQUENCY: 1-2x/week  PT DURATION: 12 weeks  PLANNED INTERVENTIONS: Therapeutic exercises, Therapeutic activity, Neuromuscular re-education, Balance training, Gait training, Patient/Family education, Self Care, Joint mobilization, Joint manipulation, Vestibular training, Canalith repositioning, Orthotic/Fit training,  DME instructions, Dry Needling, Electrical stimulation, Spinal manipulation, Spinal mobilization, Cryotherapy, Moist heat, Taping, Traction, Ultrasound, Ionotophoresis 4mg /ml Dexamethasone , Manual therapy, and Re-evaluation.  PLAN FOR NEXT SESSION: progress HEP, progress L shoulder ROM and strength per surgical protocol;  Sherill Ding Shaye Elling PT, DPT, GCS  8:45 AM,05/30/23

## 2023-05-30 ENCOUNTER — Encounter

## 2023-05-30 ENCOUNTER — Encounter: Payer: Self-pay | Admitting: Student in an Organized Health Care Education/Training Program

## 2023-05-30 ENCOUNTER — Ambulatory Visit
Attending: Student in an Organized Health Care Education/Training Program | Admitting: Student in an Organized Health Care Education/Training Program

## 2023-05-30 ENCOUNTER — Ambulatory Visit
Admission: RE | Admit: 2023-05-30 | Discharge: 2023-05-30 | Disposition: A | Discharge status: Home / Self Care | Source: Ambulatory Visit | Attending: Student in an Organized Health Care Education/Training Program | Admitting: Student in an Organized Health Care Education/Training Program

## 2023-05-30 VITALS — BP 121/83 | HR 95 | Temp 97.3°F | Resp 19 | Ht 72.0 in | Wt 347.0 lb

## 2023-05-30 DIAGNOSIS — M19032 Primary osteoarthritis, left wrist: Secondary | ICD-10-CM | POA: Diagnosis not present

## 2023-05-30 DIAGNOSIS — M19031 Primary osteoarthritis, right wrist: Secondary | ICD-10-CM | POA: Insufficient documentation

## 2023-05-30 DIAGNOSIS — M47816 Spondylosis without myelopathy or radiculopathy, lumbar region: Secondary | ICD-10-CM | POA: Diagnosis not present

## 2023-05-30 MED ORDER — DEXAMETHASONE SODIUM PHOSPHATE 10 MG/ML IJ SOLN
10.0000 mg | Freq: Once | INTRAMUSCULAR | Status: AC
  Administered 2023-05-30: 10 mg

## 2023-05-30 MED ORDER — LIDOCAINE HCL 2 % IJ SOLN
20.0000 mL | Freq: Once | INTRAMUSCULAR | Status: AC
  Administered 2023-05-30: 400 mg

## 2023-05-30 MED ORDER — ROPIVACAINE HCL 2 MG/ML IJ SOLN
18.0000 mL | Freq: Once | INTRAMUSCULAR | Status: AC
  Administered 2023-05-30: 20 mL via PERINEURAL

## 2023-05-30 MED ORDER — LIDOCAINE HCL 2 % IJ SOLN
INTRAMUSCULAR | Status: AC
  Filled 2023-05-30: qty 20

## 2023-05-30 MED ORDER — DEXAMETHASONE SODIUM PHOSPHATE 10 MG/ML IJ SOLN
INTRAMUSCULAR | Status: AC
Start: 2023-05-30 — End: ?
  Filled 2023-05-30: qty 1

## 2023-05-30 MED ORDER — ROPIVACAINE HCL 2 MG/ML IJ SOLN
INTRAMUSCULAR | Status: AC
  Filled 2023-05-30: qty 20

## 2023-05-30 MED ORDER — DEXAMETHASONE SODIUM PHOSPHATE 10 MG/ML IJ SOLN
20.0000 mg | Freq: Once | INTRAMUSCULAR | Status: AC
  Administered 2023-05-30: 10 mg
  Filled 2023-05-30: qty 2

## 2023-05-30 MED ORDER — ROPIVACAINE HCL 2 MG/ML IJ SOLN
4.0000 mL | Freq: Once | INTRAMUSCULAR | Status: AC
  Administered 2023-05-30: 4 mL via INTRA_ARTICULAR

## 2023-05-30 MED ORDER — DEXAMETHASONE SODIUM PHOSPHATE 10 MG/ML IJ SOLN
INTRAMUSCULAR | Status: AC
  Filled 2023-05-30: qty 2

## 2023-05-30 NOTE — Progress Notes (Signed)
 Safety precautions to be maintained throughout the outpatient stay will include: orient to surroundings, keep bed in low position, maintain call bell within reach at all times, provide assistance with transfer out of bed and ambulation.

## 2023-05-30 NOTE — Progress Notes (Signed)
 PROVIDER NOTE: Interpretation of information contained herein should be left to medically-trained personnel. Specific patient instructions are provided elsewhere under "Patient Instructions" section of medical record. This document was created in part using STT-dictation technology, any transcriptional errors that may result from this process are unintentional.  Patient: [name] Type: Established DOB: 1955/12/27 MRN: [id number] PCP: Raina Bunting, DO  Service: Procedure DOS: 05/30/2023 Setting: Ambulatory Location: Ambulatory outpatient facility Delivery: Face-to-face Provider: Cephus Collin, MD Specialty: Interventional Pain Management Specialty designation: 09 Location: Outpatient facility Ref. Prov.: Domingo Friend *       Interventional Therapy   Primary Reason for Visit: Interventional Pain Management Treatment. CC: Back Pain and Wrist Pain (B/l; right is worse)    Procedure:          Anesthesia, Analgesia, Anxiolysis:  Type: Diagnostic CMC (Carpometacarpal) Steroid Injection           Region: Dorsal Interspace between scaphoid and radial head Level: Wrist Laterality: Bilateral  Type: Local Anesthesia Local Anesthetic: Lidocaine  1-2% Sedation: None  Indication(s):  Analgesia Route: Infiltration ([place]/IM) IV Access: N/A   Position: Sitting    NAS-11 Pain score:   Pre-procedure: 5 /10   Post-procedure: 0-No pain/10     H&P (Pre-op Assessment):  [name] is a 68 y.o. (year old), male patient, seen today for interventional treatment. He  has a past surgical history that includes Knee surgery (Right); Gastric bypass (01/17/2000); prostate seeding; Carpal tunnel release (Right, 12/23/2014); Carpal tunnel release (Left, 01/06/2015); Colonoscopy with propofol  (N/A, 10/08/2017); Cardioversion (N/A, 05/15/2019); Cardioversion (N/A, 03/13/2014); Reverse shoulder arthroplasty (Right, 09/27/2021); Bicept tenodesis (Right, 09/27/2021); Shoulder arthroscopy with  rotator cuff repair and open biceps tenodesis (Left, 12/27/2021); Cardioversion (N/A, 03/24/2022); Cardioversion (N/A, 05/19/2022); Colonoscopy with propofol  (N/A, 06/07/2022); Esophagogastroduodenoscopy (egd) with propofol  (N/A, 06/07/2022); enteroscopy (06/07/2022); Cardioversion (N/A, 07/25/2022); Cardioversion (N/A, 09/21/2022); Atrial fibrillation ablation (N/A, 11/17/2022); and Reverse shoulder arthroplasty (Left, 02/20/2023). [name] has a current medication list which includes the following prescription(s): amiodarone , amlodipine , benazepril , buspirone , carboxymethylcellulose, cyanocobalamin , cyclobenzaprine , duloxetine , ferrous bisglycinate chelate, fluticasone , hydrocodone -acetaminophen , [START ON 06/20/2023] hydrocodone -acetaminophen , [START ON 07/20/2023] hydrocodone -acetaminophen , melatonin, multivitamin with minerals, trazodone , and xarelto . His primarily concern today is the Back Pain and Wrist Pain (B/l; right is worse)  Initial Vital Signs:  Pulse/HCG Rate: 95ECG Heart Rate: 91 Temp: (!) 97.3 F (36.3 C) Resp: 16 BP: (!) 122/99 SpO2: 98 %  BMI: Estimated body mass index is 47.06 kg/m as calculated from the following:   Height as of this encounter: 6' (1.829 m).   Weight as of this encounter: 347 lb (157.4 kg).  Risk Assessment: Allergies: Reviewed. He is allergic to bupivacaine  liposome.  Allergy Precautions: None required Coagulopathies: Reviewed. None identified.  Blood-thinner therapy: None at this time Active Infection(s): Reviewed. None identified. [name] is afebrile  Site Confirmation: [name] was asked to confirm the procedure and laterality before marking the site Procedure checklist: Completed Consent: Before the procedure and under the influence of no sedative(s), amnesic(s), or anxiolytics, the patient was informed of the treatment options, risks and possible complications. To fulfill our ethical and legal obligations, as recommended by the American Medical  Association's Code of Ethics, I have informed the patient of my clinical impression; the nature and purpose of the treatment or procedure; the risks, benefits, and possible complications of the intervention; the alternatives, including doing nothing; the risk(s) and benefit(s) of the alternative treatment(s) or procedure(s); and the risk(s) and benefit(s) of doing nothing. The patient was provided information about the general risks  and possible complications associated with the procedure. These may include, but are not limited to: failure to achieve desired goals, infection, bleeding, organ or nerve damage, allergic reactions, paralysis, and death. In addition, the patient was informed of those risks and complications associated to the procedure, such as failure to decrease pain; infection; bleeding; organ or nerve damage with subsequent damage to sensory, motor, and/or autonomic systems, resulting in permanent pain, numbness, and/or weakness of one or several areas of the body; allergic reactions; (i.e.: anaphylactic reaction); and/or death. Furthermore, the patient was informed of those risks and complications associated with the medications. These include, but are not limited to: allergic reactions (i.e.: anaphylactic or anaphylactoid reaction(s)); adrenal axis suppression; blood sugar elevation that in diabetics may result in ketoacidosis or comma; water retention that in patients with history of congestive heart failure may result in shortness of breath, pulmonary edema, and decompensation with resultant heart failure; weight gain; swelling or edema; medication-induced neural toxicity; particulate matter embolism and blood vessel occlusion with resultant organ, and/or nervous system infarction; and/or aseptic necrosis of one or more joints. Finally, the patient was informed that Medicine is not an exact science; therefore, there is also the possibility of unforeseen or unpredictable risks and/or possible  complications that may result in a catastrophic outcome. The patient indicated having understood very clearly. We have given the patient no guarantees and we have made no promises. Enough time was given to the patient to ask questions, all of which were answered to the patient's satisfaction. Mr. Heppe has indicated that he wanted to continue with the procedure. Attestation: I, the ordering provider, attest that I have discussed with the patient the benefits, risks, side-effects, alternatives, likelihood of achieving goals, and potential problems during recovery for the procedure that I have provided informed consent. Date  Time: 05/30/2023  9:53 AM  Pre-Procedure Preparation:  Monitoring: As per clinic protocol. Respiration, ETCO2, SpO2, BP, heart rate and rhythm monitor placed and checked for adequate function Safety Precautions: Patient was assessed for positional comfort and pressure points before starting the procedure. Time-out: I initiated and conducted the "Time-out" before starting the procedure, as per protocol. The patient was asked to participate by confirming the accuracy of the "Time Out" information. Verification of the correct person, site, and procedure were performed and confirmed by me, the nursing staff, and the patient. "Time-out" conducted as per Joint Commission's Universal Protocol (UP.01.01.01). Time: 1049 Start Time: 1049 hrs.  Description of Procedure:           Area Prepped: Entire wrist Region ChloraPrep (2% chlorhexidine  gluconate and 70% isopropyl alcohol ) Safety Precautions: Aspiration looking for blood return was conducted prior to all injections. At no point did we inject any substances, as a needle was being advanced. No attempts were made at seeking any paresthesias. Safe injection practices and needle disposal techniques used. Medications properly checked for expiration dates. SDV (single dose vial) medications used. Technique:  The patient was placed in a  seated position with hands resting comfortably.  The skin overlying both first CMC joints was prepped with chlorhexidine  and alcohol  in a sterile fashion.  Using anatomical landmarks, the base of the first metacarpal and the trapezium were palpated to localize the [facility] joint.  A 25-gauge needle was used to access the joint space on each side.  After negative aspiration, the following was injected into each joint (for the left and right):  2 mL of 0.2% ROPIVACAINE , 1cc of Decadron  10 mg/cc   The joint was easily  entered with minimal resistance and no evidence of extravasation. The medication was injected without complication.  The needle was withdrawn, and sterile dressings were applied to both sites.  Vitals:   05/30/23 1105 05/30/23 1110 05/30/23 1113 05/30/23 1114  BP: (!) 133/100 (!) 145/105 (!) 145/105 121/83  Pulse:      Resp: 15 15 17 19   Temp:      SpO2: 95% 100% 97% 96%  Weight:      Height:        Start Time: 1049 hrs. End Time: 1113 hrs. Materials:  Needle(s) Type: Regular needle Gauge: 25G Length: 1.5-in   Post-operative Assessment:  Post-procedure Vital Signs:  Pulse/HCG Rate: 9589 Temp: (!) 97.3 F (36.3 C) Resp: 19 BP: 121/83 SpO2: 96 %  EBL: None  Complications: No immediate post-treatment complications observed by team, or reported by patient.  Note: The patient tolerated the entire procedure well. A repeat set of vitals were taken after the procedure and the patient was kept under observation following institutional policy, for this type of procedure. Post-procedural neurological assessment was performed, showing return to baseline, prior to discharge. The patient was provided with post-procedure discharge instructions, including a section on how to identify potential problems. Should any problems arise concerning this procedure, the patient was given instructions to immediately contact us , at any time, without hesitation. In any case, we plan to  contact the patient by telephone for a follow-up status report regarding this interventional procedure.  Comments:  No additional relevant information.  Plan of Care (POC)  Orders:  Orders Placed This Encounter  Procedures   DG PAIN CLINIC C-ARM 1-60 MIN NO REPORT    Intraoperative interpretation by procedural physician at [facility].    Standing Status:   Standing    Number of Occurrences:   1    Reason for exam::   Assistance in needle guidance and placement for procedures requiring needle placement in or near specific anatomical locations not easily accessible without such assistance.   Chronic Opioid Analgesic:  hydrocodone  7.5 mg 3 times daily as needed     Medications ordered for procedure: Meds ordered this encounter  Medications   lidocaine  (XYLOCAINE ) 2 % (with pres) injection 400 mg   ropivacaine  (PF) 2 mg/mL (0.2%) (NAROPIN ) injection 18 mL   dexamethasone  (DECADRON ) injection 20 mg   dexamethasone  (DECADRON ) injection 10 mg   ropivacaine  (PF) 2 mg/mL (0.2%) (NAROPIN ) injection 4 mL   Medications administered: We administered lidocaine , ropivacaine  (PF) 2 mg/mL (0.2%), dexamethasone , dexamethasone , and ropivacaine  (PF) 2 mg/mL (0.2%).  See the medical record for exact dosing, route, and time of administration.  Follow-up plan:   Return today (on 05/30/2023), or keep scheduled appt.      Recent Visits Date Type Provider Dept  05/21/23 Office Visit Cephus Collin, MD [facility]  03/28/23 Procedure visit Cephus Collin, MD [facility]  Showing recent visits within past 90 days and meeting all other requirements Today's Visits Date Type Provider Dept  05/30/23 Procedure visit Cephus Collin, MD [facility]  Showing today's visits and meeting all other requirements Future Appointments Date Type Provider Dept  08/20/23 Appointment Patel, Seema K, NP [facility]  Showing future appointments within next 90 days and  meeting all other requirements  Disposition: Discharge home  Discharge (Date  Time): 05/30/2023; 1119 hrs.   Primary Care Physician: Raina Bunting, DO Location: [facility] Note by: Cephus Collin, MD (TTS technology used. I apologize  for any typographical errors that were not detected and corrected.) Date: 05/30/2023; Time: 11:41 AM  Disclaimer:  Medicine is not an Visual merchandiser. The only guarantee in medicine is that nothing is guaranteed. It is important to note that the decision to proceed with this intervention was based on the information collected from the patient. The Data and conclusions were drawn from the patient's questionnaire, the interview, and the physical examination. Because the information was provided in large part by the patient, it cannot be guaranteed that it has not been purposely or unconsciously manipulated. Every effort has been made to obtain as much relevant data as possible for this evaluation. It is important to note that the conclusions that lead to this procedure are derived in large part from the available data. Always take into account that the treatment will also be dependent on availability of resources and existing treatment guidelines, considered by other Pain Management Practitioners as being common knowledge and practice, at the time of the intervention. For Medico-Legal purposes, it is also important to point out that variation in procedural techniques and pharmacological choices are the acceptable norm. The indications, contraindications, technique, and results of the above procedure should only be interpreted and judged by a Board-Certified Interventional Pain Specialist with extensive familiarity and expertise in the same exact procedure and technique.

## 2023-05-30 NOTE — Patient Instructions (Signed)

## 2023-05-30 NOTE — Progress Notes (Signed)
 PROVIDER NOTE: Interpretation of information contained herein should be left to medically-trained personnel. Specific patient instructions are provided elsewhere under "Patient Instructions" section of medical record. This document was created in part using STT-dictation technology, any transcriptional errors that may result from this process are unintentional.  Patient: [name] Type: Established DOB: 12-Jun-1955 MRN: [id number] PCP: Raina Bunting, DO  Service: Procedure DOS: 05/30/2023 Setting: Ambulatory Location: Ambulatory outpatient facility Delivery: Face-to-face Provider: Cephus Collin, MD Specialty: Interventional Pain Management Specialty designation: 09 Location: Outpatient facility Ref. Prov.: Domingo Friend *       Interventional Therapy   Procedure: Lumbar Facet, Medial Branch Radiofrequency Ablation (RFA)  Laterality: Bilateral  Level: L3, L4, and L5 Medial Branch Level(s). These levels will denervate the L3-4 and L4-5 lumbar facet joints.  Imaging: Fluoroscopy-guided         Anesthesia: Local anesthesia (1-2% Lidocaine ) Anxiolysis:10 mg PO Valium  DOS: 05/30/2023  Performed by: Cephus Collin, MD  Purpose: Therapeutic/Palliative Indications: Low back pain severe enough to impact quality of life or function. Indications: Lumbar facet arthropathy  [name] has been dealing with the above chronic pain for longer than three months and has either failed to respond, was unable to tolerate, or simply did not get enough benefit from other more conservative therapies including, but not limited to: 1. Over-the-counter medications 2. Anti-inflammatory medications 3. Muscle relaxants 4. Membrane stabilizers 5. Opioids 6. Physical therapy and/or chiropractic manipulation 7. Modalities (Heat, ice, etc.) 8. Invasive techniques such as nerve blocks. [name] has attained more than 50% relief of the pain from a series of diagnostic injections conducted in  separate occasions.  Pain Score: Pre-procedure: 5 /10 Post-procedure: 0-No pain/10     Position / Prep / Materials:  Position: Prone  Prep solution: DuraPrep (Iodine Povacrylex [0.7% available iodine] and Isopropyl Alcohol , 74% w/w) Prep Area: Entire Lumbosacral Region (Lower back from mid-thoracic region to end of tailbone and from flank to flank.) Materials:  Tray: RFA (Radiofrequency) tray Needle(s):  Type: RFA (Teflon-coated radiofrequency ablation needles) Gauge (G): 22  Length: Regular (10cm) Qty: 3      Pre-op H&P Assessment:  [name] is a 68 y.o. (year old), male patient, seen today for interventional treatment. He  has a past surgical history that includes Knee surgery (Right); Gastric bypass (01/17/2000); prostate seeding; Carpal tunnel release (Right, 12/23/2014); Carpal tunnel release (Left, 01/06/2015); Colonoscopy with propofol  (N/A, 10/08/2017); Cardioversion (N/A, 05/15/2019); Cardioversion (N/A, 03/13/2014); Reverse shoulder arthroplasty (Right, 09/27/2021); Bicept tenodesis (Right, 09/27/2021); Shoulder arthroscopy with rotator cuff repair and open biceps tenodesis (Left, 12/27/2021); Cardioversion (N/A, 03/24/2022); Cardioversion (N/A, 05/19/2022); Colonoscopy with propofol  (N/A, 06/07/2022); Esophagogastroduodenoscopy (egd) with propofol  (N/A, 06/07/2022); enteroscopy (06/07/2022); Cardioversion (N/A, 07/25/2022); Cardioversion (N/A, 09/21/2022); Atrial fibrillation ablation (N/A, 11/17/2022); and Reverse shoulder arthroplasty (Left, 02/20/2023). [name] has a current medication list which includes the following prescription(s): amiodarone , amlodipine , benazepril , buspirone , carboxymethylcellulose, cyanocobalamin , cyclobenzaprine , duloxetine , ferrous bisglycinate chelate, fluticasone , hydrocodone -acetaminophen , [START ON 06/20/2023] hydrocodone -acetaminophen , [START ON 07/20/2023] hydrocodone -acetaminophen , melatonin, multivitamin with minerals, trazodone , and xarelto . His  primarily concern today is the Back Pain and Wrist Pain (B/l; right is worse)  Initial Vital Signs:  Pulse/HCG Rate: 95ECG Heart Rate: 91 Temp: (!) 97.3 F (36.3 C) Resp: 16 BP: (!) 122/99 SpO2: 98 %  BMI: Estimated body mass index is 47.06 kg/m as calculated from the following:   Height as of this encounter: 6' (1.829 m).   Weight as of this encounter: 347 lb (157.4 kg).  Risk Assessment: Allergies: Reviewed. He is allergic  to bupivacaine  liposome.  Allergy Precautions: None required Coagulopathies: Reviewed. None identified.  Blood-thinner therapy: None at this time Active Infection(s): Reviewed. None identified. [name] is afebrile  Site Confirmation: [name] was asked to confirm the procedure and laterality before marking the site Procedure checklist: Completed Consent: Before the procedure and under the influence of no sedative(s), amnesic(s), or anxiolytics, the patient was informed of the treatment options, risks and possible complications. To fulfill our ethical and legal obligations, as recommended by the American Medical Association's Code of Ethics, I have informed the patient of my clinical impression; the nature and purpose of the treatment or procedure; the risks, benefits, and possible complications of the intervention; the alternatives, including doing nothing; the risk(s) and benefit(s) of the alternative treatment(s) or procedure(s); and the risk(s) and benefit(s) of doing nothing. The patient was provided information about the general risks and possible complications associated with the procedure. These may include, but are not limited to: failure to achieve desired goals, infection, bleeding, organ or nerve damage, allergic reactions, paralysis, and death. In addition, the patient was informed of those risks and complications associated to Spine-related procedures, such as failure to decrease pain; infection (i.e.: Meningitis, epidural or intraspinal abscess);  bleeding (i.e.: epidural hematoma, subarachnoid hemorrhage, or any other type of intraspinal or peri-dural bleeding); organ or nerve damage (i.e.: Any type of peripheral nerve, nerve root, or spinal cord injury) with subsequent damage to sensory, motor, and/or autonomic systems, resulting in permanent pain, numbness, and/or weakness of one or several areas of the body; allergic reactions; (i.e.: anaphylactic reaction); and/or death. Furthermore, the patient was informed of those risks and complications associated with the medications. These include, but are not limited to: allergic reactions (i.e.: anaphylactic or anaphylactoid reaction(s)); adrenal axis suppression; blood sugar elevation that in diabetics may result in ketoacidosis or comma; water retention that in patients with history of congestive heart failure may result in shortness of breath, pulmonary edema, and decompensation with resultant heart failure; weight gain; swelling or edema; medication-induced neural toxicity; particulate matter embolism and blood vessel occlusion with resultant organ, and/or nervous system infarction; and/or aseptic necrosis of one or more joints. Finally, the patient was informed that Medicine is not an exact science; therefore, there is also the possibility of unforeseen or unpredictable risks and/or possible complications that may result in a catastrophic outcome. The patient indicated having understood very clearly. We have given the patient no guarantees and we have made no promises. Enough time was given to the patient to ask questions, all of which were answered to the patient's satisfaction. Mr. Starwalt has indicated that he wanted to continue with the procedure. Attestation: I, the ordering provider, attest that I have discussed with the patient the benefits, risks, side-effects, alternatives, likelihood of achieving goals, and potential problems during recovery for the procedure that I have provided informed  consent. Date  Time: 05/30/2023  9:53 AM   Pre-Procedure Preparation:  Monitoring: As per clinic protocol. Respiration, ETCO2, SpO2, BP, heart rate and rhythm monitor placed and checked for adequate function Safety Precautions: Patient was assessed for positional comfort and pressure points before starting the procedure. Time-out: I initiated and conducted the "Time-out" before starting the procedure, as per protocol. The patient was asked to participate by confirming the accuracy of the "Time Out" information. Verification of the correct person, site, and procedure were performed and confirmed by me, the nursing staff, and the patient. "Time-out" conducted as per Joint Commission's Universal Protocol (UP.01.01.01). Time: 1049 Start Time:  1049 hrs.  Description of Procedure:          Laterality: bilateral Levels:  See above. Safety Precautions: Aspiration looking for blood return was conducted prior to all injections. At no point did we inject any substances, as a needle was being advanced. Before injecting, the patient was told to immediately notify me if he was experiencing any new onset of "ringing in the ears, or metallic taste in the mouth". No attempts were made at seeking any paresthesias. Safe injection practices and needle disposal techniques used. Medications properly checked for expiration dates. SDV (single dose vial) medications used. After the completion of the procedure, all disposable equipment used was discarded in the proper designated medical waste containers. Local Anesthesia: Protocol guidelines were followed. The patient was positioned over the fluoroscopy table. The area was prepped in the usual manner. The time-out was completed. The target area was identified using fluoroscopy. A 12-in long, straight, sterile hemostat was used with fluoroscopic guidance to locate the targets for each level blocked. Once located, the skin was marked with an approved surgical skin marker. Once  all sites were marked, the skin (epidermis, dermis, and hypodermis), as well as deeper tissues (fat, connective tissue and muscle) were infiltrated with a small amount of a short-acting local anesthetic, loaded on a 10cc syringe with a 25G, 1.5-in  Needle. An appropriate amount of time was allowed for local anesthetics to take effect before proceeding to the next step. Technical description of process:  Radiofrequency Ablation (RFA) L3 Medial Branch Nerve RFA: The target area for the L3 medial branch is at the junction of the postero-lateral aspect of the superior articular process and the superior, posterior, and medial edge of the transverse process of L4. Under fluoroscopic guidance, a Radiofrequency needle was inserted until contact was made with os over the superior postero-lateral aspect of the pedicular shadow (target area). Sensory and motor testing was conducted to properly adjust the position of the needle. Once satisfactory placement of the needle was achieved, the numbing solution was slowly injected after negative aspiration for blood. 2.0 mL of the nerve block solution was injected without difficulty or complication. After waiting for at least 3 minutes, the ablation was performed. Once completed, the needle was removed intact. L4 Medial Branch Nerve RFA: The target area for the L4 medial branch is at the junction of the postero-lateral aspect of the superior articular process and the superior, posterior, and medial edge of the transverse process of L5. Under fluoroscopic guidance, a Radiofrequency needle was inserted until contact was made with os over the superior postero-lateral aspect of the pedicular shadow (target area). Sensory and motor testing was conducted to properly adjust the position of the needle. Once satisfactory placement of the needle was achieved, the numbing solution was slowly injected after negative aspiration for blood. 2.0 mL of the nerve block solution was injected without  difficulty or complication. After waiting for at least 3 minutes, the ablation was performed. Once completed, the needle was removed intact. L5 Medial Branch Nerve RFA: The target area for the L5 medial branch is at the junction of the postero-lateral aspect of the superior articular process of S1 and the superior, posterior, and medial edge of the sacral ala. Under fluoroscopic guidance, a Radiofrequency needle was inserted until contact was made with os over the superior postero-lateral aspect of the pedicular shadow (target area). Sensory and motor testing was conducted to properly adjust the position of the needle. Once satisfactory placement of the needle was  achieved, the numbing solution was slowly injected after negative aspiration for blood. 2.0 mL of the nerve block solution was injected without difficulty or complication. After waiting for at least 3 minutes, the ablation was performed. Once completed, the needle was removed intact.   6 cc solution made of 4 cc of 0.2% ropivacaine , 1 cc of Decadron  10 mg/cc.  1 cc injected at each level above on the right & left after sensorimotor testing, prior to lesioning.  Radiofrequency lesioning (ablation):  Radiofrequency Generator: Medtronic AccurianTM AG 1000 RF Generator Sensory Stimulation Parameters: 50 Hz was used to locate & identify the nerve, making sure that the needle was positioned such that there was no sensory stimulation below 0.3 V or above 0.7 V. Motor Stimulation Parameters: 2 Hz was used to evaluate the motor component. Care was taken not to lesion any nerves that demonstrated motor stimulation of the lower extremities at an output of less than 2.5 times that of the sensory threshold, or a maximum of 2.0 V. Lesioning Technique Parameters: Standard Radiofrequency settings. (Not bipolar or pulsed.) Temperature Settings: 80 degrees C Lesioning time: 60 seconds Intra-operative Compliance: Compliant  Once the entire procedure was  completed, the treated area was cleaned, making sure to leave some of the prepping solution back to take advantage of its long term bactericidal properties.    Illustration of the posterior view of the lumbar spine and the posterior neural structures. Laminae of L2 through S1 are labeled. DPRL5, dorsal primary ramus of L5; DPRS1, dorsal primary ramus of S1; DPR3, dorsal primary ramus of L3; FJ, facet (zygapophyseal) joint L3-L4; I, inferior articular process of L4; LB1, lateral branch of dorsal primary ramus of L1; IAB, inferior articular branches from L3 medial branch (supplies L4-L5 facet joint); IBP, intermediate branch plexus; MB3, medial branch of dorsal primary ramus of L3; NR3, third lumbar nerve root; S, superior articular process of L5; SAB, superior articular branches from L4 (supplies L4-5 facet joint also); TP3, transverse process of L3.  Vitals:   05/30/23 1105 05/30/23 1110 05/30/23 1113 05/30/23 1114  BP: (!) 133/100 (!) 145/105 (!) 145/105 121/83  Pulse:      Resp: 15 15 17 19   Temp:      SpO2: 95% 100% 97% 96%  Weight:      Height:        Start Time: 1049 hrs. End Time: 1113 hrs.  Imaging Guidance (Spinal):          Type of Imaging Technique: Fluoroscopy Guidance (Spinal) Indication(s): Assistance in needle guidance and placement for procedures requiring needle placement in or near specific anatomical locations not easily accessible without such assistance. Exposure Time: Please see nurses notes. Contrast: None used. Fluoroscopic Guidance: I was personally present during the use of fluoroscopy. "Tunnel Vision Technique" used to obtain the best possible view of the target area. Parallax error corrected before commencing the procedure. "Direction-depth-direction" technique used to introduce the needle under continuous pulsed fluoroscopy. Once target was reached, antero-posterior, oblique, and lateral fluoroscopic projection used confirm needle placement in all planes. Images  permanently stored in EMR. Interpretation: No contrast injected. I personally interpreted the imaging intraoperatively. Adequate needle placement confirmed in multiple planes. Permanent images saved into the patient's record.  Antibiotic Prophylaxis:   Anti-infectives (From admission, onward)    None      Indication(s): None identified  Post-operative Assessment:  Post-procedure Vital Signs:  Pulse/HCG Rate: 9589 Temp: (!) 97.3 F (36.3 C) Resp: 19 BP: 121/83 SpO2: 96 %  EBL:  None  Complications: No immediate post-treatment complications observed by team, or reported by patient.  Note: The patient tolerated the entire procedure well. A repeat set of vitals were taken after the procedure and the patient was kept under observation following institutional policy, for this type of procedure. Post-procedural neurological assessment was performed, showing return to baseline, prior to discharge. The patient was provided with post-procedure discharge instructions, including a section on how to identify potential problems. Should any problems arise concerning this procedure, the patient was given instructions to immediately contact us , at any time, without hesitation. In any case, we plan to contact the patient by telephone for a follow-up status report regarding this interventional procedure.  Comments:  No additional relevant information.  Plan of Care (POC)  Orders:  Orders Placed This Encounter  Procedures   DG PAIN [facility] C-ARM 1-60 MIN NO REPORT    Intraoperative interpretation by procedural physician at [facility].    Standing Status:   Standing    Number of Occurrences:   1    Reason for exam::   Assistance in needle guidance and placement for procedures requiring needle placement in or near specific anatomical locations not easily accessible without such assistance.   Chronic Opioid Analgesic:   hydrocodone  7.5 mg 3 times daily as needed     Medications ordered  for procedure: Meds ordered this encounter  Medications   lidocaine  (XYLOCAINE ) 2 % (with pres) injection 400 mg   ropivacaine  (PF) 2 mg/mL (0.2%) (NAROPIN ) injection 18 mL   dexamethasone  (DECADRON ) injection 20 mg   dexamethasone  (DECADRON ) injection 10 mg   ropivacaine  (PF) 2 mg/mL (0.2%) (NAROPIN ) injection 4 mL   Medications administered: We administered lidocaine , ropivacaine  (PF) 2 mg/mL (0.2%), dexamethasone , dexamethasone , and ropivacaine  (PF) 2 mg/mL (0.2%).  See the medical record for exact dosing, route, and time of administration.  Follow-up plan:   Return today (on 05/30/2023), or keep scheduled appt.      Recent Visits Date Type Provider Dept  05/21/23 Office Visit Cephus Collin, MD [facility]  03/28/23 Procedure visit Cephus Collin, MD [facility]  Showing recent visits within past 90 days and meeting all other requirements Today's Visits Date Type Provider Dept  05/30/23 Procedure visit Cephus Collin, MD [facility]  Showing today's visits and meeting all other requirements Future Appointments Date Type Provider Dept  08/20/23 Appointment Patel, Seema K, NP [facility]  Showing future appointments within next 90 days and meeting all other requirements  Disposition: Discharge home  Discharge (Date  Time): 05/30/2023; 1119 hrs.   Primary Care Physician: Raina Bunting, DO Location: [facility] Note by: Cephus Collin, MD (TTS technology used. I apologize for any typographical errors that were not detected and corrected.) Date: 05/30/2023; Time: 11:40 AM  Disclaimer:  Medicine is not an Visual merchandiser. The only guarantee in medicine is that nothing is guaranteed. It is important to note that the decision to proceed with this intervention was based on the information collected from the patient. The Data and conclusions were drawn from the patient's questionnaire, the interview, and the  physical examination. Because the information was provided in large part by the patient, it cannot be guaranteed that it has not been purposely or unconsciously manipulated. Every effort has been made to obtain as much relevant data as possible for this evaluation. It is important to note that the conclusions that lead to this procedure are derived in large part from the  available data. Always take into account that the treatment will also be dependent on availability of resources and existing treatment guidelines, considered by other Pain Management Practitioners as being common knowledge and practice, at the time of the intervention. For Medico-Legal purposes, it is also important to point out that variation in procedural techniques and pharmacological choices are the acceptable norm. The indications, contraindications, technique, and results of the above procedure should only be interpreted and judged by a Board-Certified Interventional Pain Specialist with extensive familiarity and expertise in the same exact procedure and technique.

## 2023-05-31 ENCOUNTER — Telehealth: Payer: Self-pay | Admitting: *Deleted

## 2023-05-31 NOTE — Telephone Encounter (Signed)
 Post procedure call; no questions or concerns, asked about activity level and activity as tolerated is how can proceed. Patient verbalizes u/o information.

## 2023-06-01 ENCOUNTER — Ambulatory Visit: Attending: Cardiology | Admitting: Cardiology

## 2023-06-01 VITALS — BP 140/84 | HR 90 | Ht 72.0 in | Wt 348.6 lb

## 2023-06-01 DIAGNOSIS — Z5181 Encounter for therapeutic drug level monitoring: Secondary | ICD-10-CM

## 2023-06-01 DIAGNOSIS — D6869 Other thrombophilia: Secondary | ICD-10-CM | POA: Diagnosis not present

## 2023-06-01 DIAGNOSIS — Z79899 Other long term (current) drug therapy: Secondary | ICD-10-CM

## 2023-06-01 DIAGNOSIS — I10 Essential (primary) hypertension: Secondary | ICD-10-CM | POA: Diagnosis not present

## 2023-06-01 DIAGNOSIS — I4819 Other persistent atrial fibrillation: Secondary | ICD-10-CM

## 2023-06-01 MED ORDER — AMLODIPINE BESYLATE 5 MG PO TABS: 5.0000 mg | ORAL_TABLET | Freq: Every day | ORAL | 3 refills | Status: AC

## 2023-06-01 NOTE — Progress Notes (Signed)
 Electrophysiology Clinic Note    Date:  06/01/2023  Patient ID:  [name], [name] 03-Jun-1955, MRN [id number] PCP:  Raina Bunting, DO  Cardiologist:  Belva Boyden, MD Electrophysiologist: Richardo Chandler, MD   Discussed the use of AI scribe software for clinical note transcription with the patient, who gave verbal consent to proceed.   Patient Profile    Chief Complaint: AFib, amiodarone  follow-up  History of Present Illness: [name] is a 68 y.o. male with PMH notable for persis Afib, HFmrEF, CAD, HTN, ; seen today for Richardo Chandler, MD for routine electrophysiology followup.   He is s/p AF ablation with isolation of pulm veins and posterior wall on 11/2022 by Dr. Ewing Holiday at [facility].  He last saw Dr. Rodolfo Clan 01/2023 where his AF burden had significantly improved, but was still having paroxysms, elected to continue amiodarone  200mg  daily and reassess.  On follow-up today, he is not aware of any AFib episodes since last visit. He wears a watch that monitors his HR, but does not have EKG capabilities. Has rare episodes where his HR jumps to 140-150 for a few minutes, and then drops back to his usual rate. He does not believe these are AFib episodes. Has tried to capture on [employer]ia[employer], but gets an error message.   He has continued to take amiodarone  200mg  daily, along with xarelto . No bleeding concerns currently on xarelto . He would like to stop amiodarone , if appropriate, to limit off-target effects. No off-target effects currently.  He does not check his BP regulary, but has frequent medical appts where his BP is checked. Readings are often 140s systolic.    Arrhythmia/Device History Amiodarone       ROS:  Please see the history of present illness. All other systems are reviewed and otherwise negative.    Physical Exam    VS:  BP (!) 140/84 (BP Location: Left Arm, Patient Position: Sitting, Cuff Size: Normal)   Pulse 90   Ht 6' (1.829 m)   Wt (!) 348 lb 9.6 oz (158.1  kg)   SpO2 96%   BMI 47.28 kg/m  BMI: Body mass index is 47.28 kg/m.  Wt Readings from Last 3 Encounters:  06/01/23 (!) 348 lb 9.6 oz (158.1 kg)  05/30/23 (!) 347 lb (157.4 kg)  05/21/23 (!) 347 lb (157.4 kg)     GEN- The patient is well appearing, alert and oriented x 3 today.   Lungs- Clear to ausculation bilaterally, normal work of breathing.  Heart- Regular rate and rhythm, no murmurs, rubs or gallops Extremities- No peripheral edema, warm, dry    Studies Reviewed   Previous EP, cardiology notes.    EKG is ordered. Personal review of EKG from today shows:    EKG Interpretation Date/Time:  Friday Jun 01 2023 14:37:00 EDT Ventricular Rate:  90 PR Interval:  128 QRS Duration:  86 QT Interval:  344 QTC Calculation: 420 R Axis:   44  Text Interpretation: Sinus rhythm with Premature atrial complexes Confirmed by Kimbely Whiteaker [phone]) on 06/01/2023 2:53:24 PM     TTE, 06/03/2022  1. Left ventricular ejection fraction, by estimation, is 55 to 60%. Left ventricular ejection fraction by 2D MOD biplane is 58.3 %. The left ventricle has normal function. The left ventricle has no regional wall motion abnormalities. Left ventricular diastolic parameters are indeterminate.   2. Right ventricular systolic function is normal. The right ventricular size is normal. There is mildly elevated pulmonary artery systolic pressure. The estimated right  ventricular systolic pressure is 37.9 mmHg.   3. Left atrial size was severely dilated.   4. The mitral valve is normal in structure. Mild to moderate mitral valve regurgitation. No evidence of mitral stenosis.   5. The aortic valve has an indeterminant number of cusps. Aortic valve regurgitation is mild. Aortic valve sclerosis is present, with no evidence of aortic valve stenosis.   6. There is borderline dilatation of the ascending aorta, measuring 38 mm.   7. The inferior vena cava is normal in size with greater than 50% respiratory variability,  suggesting right atrial pressure of 3 mmHg.   TTE, 07/04/2019  1. Left ventricular ejection fraction, by estimation, is 55%. The left ventricle has low normal function. Mild hypokinesis of the mid to apical anteroseptal wall. Left ventricular diastolic parameters are consistent with Grade II diastolic dysfunction  (pseudonormalization).   2. Right ventricular systolic function is normal. The right ventricular size is normal. There is moderately elevated pulmonary artery systolic pressure.   3. Left atrial size was moderately dilated.   Comparison(s): Previous Echo showed LV EF 45-50%, mild anterior and anteroseptal HK, mild MR, moderate LAE.   Cardiac scoring CT, 11/06/2014 Coronary calcium  score of 224 (all in proximal LAD). This was 6 percentile for age and sex matched control.   Assessment and Plan     #) persis Afib #) amiodarone  monitoring S/p AF ablation 11/2022 at [facility] He does not believe he is having any AFib episodes, and instead is having rare SVT episodes that amiodarone  has never controlled well.  He would like to stop amiodarone , which I think is reasonable Continue to monitor pulse, arrhythmia with watch and [employer]  #) Hypercoag d/t persis afib CHA2DS2-VASc Score = at least 3 [CHF History: 0, HTN History: 1, Diabetes History: 0, Stroke History: 0, Vascular Disease History: 1, Age Score: 1, Gender Score: 0].  Therefore, the patient's annual risk of stroke is 3.2 %.    Stroke ppx - 20mg  xarelto , appropriately dosed for CrCl > 71ml/min No bleeding concerns  #) HTN Continues to have elevated readings Continue 40mg  benazepril  Increase amlodipine  to 5mg  daily      Current medicines are reviewed at length with the patient today.   The patient has concerns regarding his medicines.  The following changes were made today:   STOP amiodarone   Labs/ tests ordered today include:  Orders Placed This Encounter  Procedures   EKG 12-Lead     Disposition: Follow up  with Dr. Rodolfo Clan or EP APP in 6 months (or Dr. Daneil Dunker after Dr. Doyle Generous retirement)   Signed, Adaline Holly, NP  06/01/23  3:29 PM  Electrophysiology [facility]

## 2023-06-01 NOTE — Patient Instructions (Signed)
 Medication Instructions:  STOP Amiodarone   INCREASE Amlodipine  to 5 mg daily   *If you need a refill on your cardiac medications before your next appointment, please call your pharmacy*  Follow-Up: At [facility], you and your health needs are our priority.  As part of our continuing mission to provide you with exceptional heart care, our providers are all part of one team.  This team includes your primary Cardiologist (physician) and Advanced Practice Providers or APPs (Physician Assistants and Nurse Practitioners) who all work together to provide you with the care you need, when you need it.  Your next appointment:   6 month(s)  Provider:   Suzann Riddle, NP    We recommend signing up for the patient portal called "MyChart".  Sign up information is provided on this After Visit Summary.  MyChart is used to connect with patients for Virtual Visits (Telemedicine).  Patients are able to view lab/test results, encounter notes, upcoming appointments, etc.  Non-urgent messages can be sent to your provider as well.   To learn more about what you can do with MyChart, go to [email or web address].

## 2023-06-13 NOTE — Therapy (Signed)
 OUTPATIENT PHYSICAL THERAPY SHOULDER TREATMENT  Patient Name: [name] MRN: [id number] DOB:02/10/1955, 68 y.o., male Today's Date: 06/14/2023   PT End of Session - 06/14/23 1352     Visit Number 11    Number of Visits 25    Date for PT Re-Evaluation 08/20/23    Authorization Type eval: 02/26/23, [employer] Medicare, [id number] on AUTH    Authorization Time Period [facility] medicare 2024  [id number] on [place]    PT Start Time 1400    PT Stop Time 1445    PT Time Calculation (min) 45 min    Activity Tolerance Patient tolerated treatment well    Behavior During Therapy WFL for tasks assessed/performed            Past Medical History:  Diagnosis Date   (HFpEF) heart failure with preserved ejection fraction (HCC)    a.) TTE 12/26/2013: EF 45-50%, mild ant and antsept HK. mild MR. Mod dil LA. nl RV fxn. Rhythm was Afib; b.) TTE 07/04/2019: EF 55%, mid-apical anteroseptal HK, mild MAC, mild Ao sclerosis, G2DD, RVSP 45.3; c.) TTE 06/02/2022: EF 55-60%, no RWMAs, sev LAE, RVSP 37.9, mild-mod MR, mild AR, AoV sclerosis without stenosis, asc Ao 38 mm   Anxiety    Aortic atherosclerosis (HCC)    Cardiomyopathy (in setting of Afib)    a.) TTE 12/26/2013: EF 45-50%; b.) TTE 07/04/2019: EF 55%; c.) TTE 06/02/2022: EF 55-60%   Chronic pain syndrome    a.) followed by pain management   Chronic, continuous use of opioids    a.) on COT; followed by pain management   Coronary artery disease 11/06/2014   a.) cCTA 11/06/2014: Ca2+ = 224 (74th %ile; LAD distribution)   Diverticulosis    DJD (degenerative joint disease) of knee    Former smoker    History of hiatal hernia    History of kidney stones 2012   Hyperlipidemia    Hyperplastic colon polyp    Hypertension    Inguinal hernia, left    Internal hemorrhoids    Intervertebral disc disorder with radiculopathy of lumbosacral region    Lesion of bone of lumbosacral spine    L5   Long term current use of amiodarone     Lower extremity edema    Mixed  hyperlipidemia    Morbid obesity (HCC)    Multiple falls    Myalgia due to statin    On rivaroxaban  therapy    Osteoarthritis    PAF (paroxysmal atrial fibrillation) (HCC)    a.) CHA2DS2VASc = 4 (age, HTN, CHF, vascular disease) as of 02/19/23;  b.) s/p DCCV 03/13/14 (200 J), 05/15/19 (150 J x 1, 200 J x2), 05/19/2022 (150 J x1, 200 J x2; pads changed to ant/lat postion with additional 200 J x 2 -> did not convert), 07/25/22 (200 J), 09/21/2022 (200 J); c.) s/p PVI ablation 11/17/2022; d.) rate/rhythm maintained on amiodarone ; chronically anticoagulated on rivaroxaban    Pernicious anemia    Prostate cancer (HCC)    Pulmonary nodule, right    a. 10/2014 Cardiac CTA: 7mm RLL nodule; b. 04/2015 CT Chest: stable 7mm RLL nodule. No new nodules; 02/2017 CTA Chest: stable, benign, 7mm RLL pulm nodule.   Rotator cuff tear, left    Sleep difficulties    a.) takes melatonin + trazodone  PRN   SVT (supraventricular tachycardia) (HCC)    Tubular adenoma of colon    Past Surgical History:  Procedure Laterality Date   ATRIAL FIBRILLATION ABLATION N/A 11/17/2022   Procedure: ATRIAL FIBRILLATION  ABLATION; Location: [facility]; Surgeon: Gehi, Anil, MD   BICEPT TENODESIS Right 09/27/2021   Procedure: Right reverse shoulder arthroplasty, biceps tenodesis;  Surgeon: Lorri Rota, MD;  Location: [facility];  Service: Orthopedics;  Laterality: Right;   CARDIOVERSION N/A 05/15/2019   Procedure: CARDIOVERSION;  Surgeon: Devorah Fonder, MD;  Location: [facility];  Service: Cardiovascular;  Laterality: N/A;   CARDIOVERSION N/A 03/13/2014   Procedure: CARDIOVERSION; Location: [facility]; Surgeon: Belva Boyden, MD   CARDIOVERSION N/A 03/24/2022   Procedure: CARDIOVERSION;  Surgeon: Devorah Fonder, MD;  Location: [facility];  Service: Cardiovascular;  Laterality: N/A;   CARDIOVERSION N/A 05/19/2022   Procedure: CARDIOVERSION;  Surgeon: Devorah Fonder, MD;  Location: [facility];  Service: Cardiovascular;  Laterality: N/A;    CARDIOVERSION N/A 07/25/2022   Procedure: CARDIOVERSION;  Surgeon: Verona Goodwill, MD;  Location: [facility];  Service: Cardiovascular;  Laterality: N/A;   CARDIOVERSION N/A 09/21/2022   Procedure: CARDIOVERSION;  Surgeon: Verona Goodwill, MD;  Location: [facility];  Service: Cardiovascular;  Laterality: N/A;   CARPAL TUNNEL RELEASE Right 12/23/2014   Procedure: CARPAL TUNNEL RELEASE;  Surgeon: Marlynn Singer, MD;  Location: [facility];  Service: Orthopedics;  Laterality: Right;   CARPAL TUNNEL RELEASE Left 01/06/2015   Procedure: CARPAL TUNNEL RELEASE;  Surgeon: Marlynn Singer, MD;  Location: [facility];  Service: Orthopedics;  Laterality: Left;   COLONOSCOPY WITH PROPOFOL  N/A 10/08/2017   Procedure: COLONOSCOPY WITH PROPOFOL ;  Surgeon: Selena Daily, MD;  Location[facility];  Service: Gastroenterology;  Laterality: N/A;   COLONOSCOPY WITH PROPOFOL  N/A 06/07/2022   Procedure: COLONOSCOPY WITH PROPOFOL ;  Surgeon: Selena Daily, MD;  Location: [facility];  Service: Gastroenterology;  Laterality: N/A;   ENTEROSCOPY  06/07/2022   Procedure: ENTEROSCOPY;  Surgeon: Selena Daily, MD;  Location: [facility];  Service: Gastroenterology;;   ESOPHAGOGASTRODUODENOSCOPY (EGD) WITH PROPOFOL  N/A 06/07/2022   Procedure: ESOPHAGOGASTRODUODENOSCOPY (EGD) WITH PROPOFOL ;  Surgeon: Selena Daily, MD;  Location: [facility];  Service: Gastroenterology;  Laterality: N/A;   GASTRIC BYPASS  01/17/2000   KNEE SURGERY Right    knee trauma x3   prostate seeding     REVERSE SHOULDER ARTHROPLASTY Right 09/27/2021   Procedure: Right reverse shoulder arthroplasty, biceps tenodesis;  Surgeon: Lorri Rota, MD;  Location: [facility];  Service: Orthopedics;  Laterality: Right;   REVERSE SHOULDER ARTHROPLASTY Left 02/20/2023   Procedure: Left reverse shoulder arthroplasty, biceps tenodesis;  Surgeon: Lorri Rota, MD;  Location: [facility];  Service: Orthopedics;  Laterality: Left;   SHOULDER  ARTHROSCOPY WITH ROTATOR CUFF REPAIR AND OPEN BICEPS TENODESIS Left 12/27/2021   Procedure: Left shoulder arthroscopic cuff repair (supraspinatus and subscapularis) with Regeneten Patch application;  Surgeon: Lorri Rota, MD;  Location: [facility];  Service: Orthopedics;  Laterality: Left;   Patient Active Problem List   Diagnosis Date Noted   Symptomatic anemia 04/26/2022   Chronic pain of both knees 03/30/2022   Bilateral primary osteoarthritis of knee 03/30/2022   Shortness of breath 03/24/2022   Vitamin D  deficiency 02/24/2022   S/p reverse total shoulder arthroplasty 09/27/2021   Lesion of bone of lumbosacral spine (L5) 05/12/2021   Localized osteoarthritis of shoulder regions, bilateral 03/29/2021   Chronic pain of both shoulders 03/29/2021   Drug-induced myopathy 02/21/2021   Trigger finger of right hand 11/15/2020   Prostate cancer (HCC) 07/26/2020   Insomnia 08/01/2019   Primary osteoarthritis of both wrists 02/17/2019   Trigger middle finger of left hand 02/17/2019   Chronic pain syndrome 02/17/2019  Chronic radicular lumbar pain 10/08/2018   Lumbar radiculopathy 10/08/2018   Lumbar degenerative disc disease 10/08/2018   Lumbar facet arthropathy 10/08/2018   Lumbar facet joint syndrome 10/08/2018   Intervertebral disc disorder with radiculopathy of lumbosacral region    Osteoarthritis of knee 11/30/2017   Osteoarthritis of wrist 11/30/2017   Pernicious anemia 05/22/2016   Persistent atrial fibrillation (HCC) 12/20/2015   Carpal tunnel syndrome 11/16/2014   DJD (degenerative joint disease) of knee 10/27/2014   Bilateral carpal tunnel syndrome 10/27/2014   Morbid obesity with BMI of 45.0-49.9, adult (HCC) 10/27/2014   Mixed hyperlipidemia 10/27/2014   H/O gastric bypass 03/20/2014   Hyperkalemia 03/20/2014   History of prostate cancer 12/26/2013   Essential hypertension 12/26/2013   Encounter for anticoagulation discussion and counseling 12/26/2013    PCP: Dr.  Romeo Co  REFERRING PROVIDER: Dr. Lorri Rota  REFERRING DIAG: [phone] (ICD-10-CM) - Complete rotator cuff tear or rupture of left shoulder, not specified as traumatic   RATIONALE FOR EVALUATION AND TREATMENT: Rehabilitation  THERAPY DIAG: Chronic left shoulder pain  Muscle weakness (generalized)  ONSET DATE: 02/20/23  FOLLOW-UP APPT SCHEDULED WITH REFERRING PROVIDER: Yes   FROM INITIAL EVALUATION SUBJECTIVE:                                                                                                                                                                                         SUBJECTIVE STATEMENT:  L reverse TSR 02/20/23  PERTINENT HISTORY:  SAHIB PELLA is a 68 y.o. male who initially underwent arthroscopic subscapularis repair and side-to-side supraspinatus repair with Regeneten patch application by Dr. Lydia Sams 12/27/2021. He underwent post-surgical physical therapy with initial minimal improvement followed by worsening shoulder strength/function. Repeat MRI showed large, retracted subscapularis re-tear and supraspinatus re-tear with signs of rotator cuff arthropathy. Conservative measures including medications, physical therapy, and cortisone injections did not provide adequate relief. The patient elected to proceed with reverse shoulder arthroplasty and biceps tenodesis on 02/20/23. Intraoperative findings include retracted full-thickness subscapularis tear, partial-thickness supraspinatus tear, and biceps tendinopathy. Surgery was a standard deltopectoral incision under general anesthesia along with an interscalene block. Per op note, operative arm to remain in sling at all times except ROM exercises and hygiene. Can perform pendulums, elbow/wrist/hand ROM exercises. Passive ROM allowed to 90 FF and 30 ER. ASA 325mg  x 6 weeks for DVT ppx (pt states that he was actually instructed to start back on his Xarelto  and not to use ASA). Plan for PT starting on POD #3-4. Pt denies any  post-operative complications from surgery. Pt has a history of chronic back pain and has undergone multiple lumbar facet, medial branch radiofrequency ablations.  He also has a past history of cardiomyopathy (in setting of Afib), DJD (degenerative joint disease) of knee, kidney stones, lower extremity edema, PAF (paroxysmal atrial fibrillation), pernicious anemia, prostate cancer, and a pulmonary nodule. He also has a past surgical history that includes R reverse TSR, knee surgery, gastric bypass (2002), prostate seeding, carpal tunnel release (Right, 12/23/2014; Left, 01/06/2015), and multiple cardioversions.   PAIN:  Pain Intensity: No resting pain upon arrival Pain location: L shoulder Pain Quality: "Nerve pain" Radiating: No  Numbness/Tingling: No Focal Weakness: Yes 24-hour pain behavior: Varies History of prior shoulder or neck/shoulder injury, pain, surgery, or therapy: Yes PT for L shoulder s/p RTC repair, history of R reverse TSR; Dominant hand: right Red flags Positive: history of prostate CA, Negative: chills/fever, night sweats, nausea, vomiting, unrelenting pain, unexplained weight gain/loss  PRECAUTIONS: Shoulder, per operative note: operative arm to remain in sling at all times except ROM exercises and hygiene. Can perform pendulums, elbow/wrist/hand ROM exercises. Passive ROM allowed to 90 FF and 30 ER.   General reverse TSR precautions include no combined shoulder extension, IR, and adduction. No shoulder and elbow AROM (bicep tenodesis) for at least the first 4 weeks   WEIGHT BEARING RESTRICTIONS: Yes no WB through LUE  FALLS: Has patient fallen in last 6 months? 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  Recent memory is intact.  Remote memory is intact.  Attention span and concentration are intact.  Expressive speech is intact.  Patient's fund of knowledge is within normal limits for educational level.    Gross Musculoskeletal Assessment Tremor: None Bulk: Normal Tone: Normal Incision is clean and dry with minimal blood staining on honeycomb dressing as well as minimal swelling around the incision. Skin is cool to touch and no signs of infection present.   Gait Deferred full gait assessment  Posture Forward head  with upper thoracic kyphosis and rounded shoulders  Cervical Screen Deferred  ROM ROM (Normal range in degrees)    Right (AROM) Left (PROM)  Shoulder    Flexion 160 90 (limited per protocol)  Extension    Abduction 180 90 (scaption)  External Rotation 88 0  Internal Rotation 70 stomach  Hands Behind Head    Hands Behind Back        Elbow    Flexion Full Full  Extension 0 0  Pronation WNL Full palm up  Supination WNL Full palm down  (* = pain; Blank rows = not tested)  UE MMT: MMT (out of 5) Right Left   Shoulder   Flexion 5   Extension    Abduction 5   External rotation 5   Internal rotation 5   Horizontal abduction    Horizontal adduction    Lower Trapezius    Rhomboids        Elbow  Flexion 5   Extension 5   Pronation 5   Supination 5  Wrist  Flexion 5 5  Extension 5 5  Radial deviation  5  Ulnar deviation  5      MCP  Flexion 5 5  Extension 5 5  Abduction 5 5  Adduction 5 5  (* = pain; Blank rows = not  tested)  Sensation Deferred  Reflexes Deferred  Palpation No tenderness to light palpation around entire L shoulder girdle;  Passive Accessory Intervertebral Motion Deferred  Accessory Motions/Glides Deferred  Muscle Length Testing Deferred  SPECIAL TESTS Deferred  Beighton scale Deferred   TODAY'S TREATMENT: 06/14/23   Subjective: Patient reports ongoing L shoulder stiffness/soreness and ache. He had his lumbar RFA with Dr. Rhesa Celeste and has yet to achieve full benefit which usually takes multiple weeks. Saw electro cardiology PA and DC amiodarone  without any additional episodes of afib.  No more episodes of L shoulder instability/subluxation. No specific questions currently.    PAIN: Burning/ache    Ther-ex  In semi-recumbent position: L shoulder flexion with 3# dumbbell 2 x 10; L shoulder scaption with 3# DB 2 x 10; L shoulder overhead press with 3# DB 2 x 10; L shoulder manually resisted ER 2 x 10; L shoulder manually resisted IR 2 x 10; L elbow manually resisted flexion/extension 2 x 10 each; L forearm manually resisted pronation/supination x 10 each; Dowel chest press with manual resistance from therapist 2 x 10; Nautilus lat pull downs 80# 3 x 10; Nautilus rows 40# 3 x 10; Standing green tband resisted shoulder IR and ER 2 x 10 each; Ice pack applied to L shoulder at end of session (unbilled);   HOME EXERCISE PROGRAM:  Access Code: [email or web address] URL: [email or web address] Date: 02/28/2023 Prepared by: Crawford Dock  Exercises - Seated Cervical Sidebending Stretch (Mirrored)  - 2 x daily - 7 x weekly - 3 reps - 30s hold - Seated Scapular Retraction  - 2 x daily - 7 x weekly - 2 sets - 10 reps - 3s hold - Circular Shoulder Pendulum with Table Support  - 2 x daily - 7 x weekly - 3 reps - 60s hold - Horizontal Shoulder Pendulum with Table Support  - 2 x daily - 7 x weekly - 3 reps - 60s hold - Flexion-Extension Shoulder Pendulum with Table  Support (Mirrored)  - 2 x daily - 7 x weekly - 3 reps - 60s hold - Wrist Flexion AROM  - 2 x daily - 7 x weekly - 2 sets - 10 reps - 3s hold - Wrist Extension AROM  - 1 x daily - 7 x weekly - 2 sets - 10 reps - 3s hold    ASSESSMENT: No notable pain reported during therapy session today but muscle fatigue reported. He continues with most notable weakness in L shoulder scaption/abduction and ER. Continued strengthening on the Nautilus machine today. Pt encouraged to continue HEP and follow up as directed. He will continue to benefit from skilled therapy to address remaining deficits in order to improve quality of life and return to PLOF.     OBJECTIVE IMPAIRMENTS: decreased ROM, decreased strength, and pain.   ACTIVITY LIMITATIONS: carrying, lifting, bathing, toileting, dressing, and reach over head  PARTICIPATION LIMITATIONS: meal prep, cleaning, laundry, driving, shopping, community activity, and yard work  PERSONAL FACTORS: Past/current experiences, Time since onset of injury/illness/exacerbation, and 3+ comorbidities: OA, anemia, anxiety, cardiomyopathy, chronic pain are also affecting patient's functional outcome.   REHAB POTENTIAL: Good  CLINICAL DECISION MAKING: Unstable/unpredictable  EVALUATION COMPLEXITY: High   GOALS: Goals reviewed  with patient? Yes  SHORT TERM GOALS: Target date: 07/09/2023   Pt will be independent with HEP to improve strength and decrease shoulder pain to improve pain-free function at home and work. Baseline: Issued and progressing Goal status: ONGOING   LONG TERM GOALS: Target date: 08/20/2023  1.  Pt will report no further L shoulder pain with all AROM in order to complete all ADLs and improve pain-free function. Baseline: 05/28/23: worst pain 7/10; Goal status: ONGOING  2.  Pt will decrease QuickDASH score to below 30% in order to demonstrate clinically significant reduction in disability related to shoulder pain        Baseline: 02/26/23: 72.7%;  05/28/23: 40.9% Goal status: REVISED  3. Pt will increase strength of L shoulder flexion and scaption to at least 4/5 in order to demonstrate improvement in strength and function         Baseline: 02/26/23: Unable to test; 05/28/23: flexion: 4+/5, pain free, scaption: 4/5, pain Goal status: ACHIEVED  4. Pt will improve L shoulder AROM flexion and scaption to at least 110 degrees in order to demonstrate improvement in function so he can take care of his property and complete all household responsibilities;        Baseline: 02/26/23: Unable to test (90 degrees PROM for both), 05/28/23: L shoulder AROM against gravity: flexion: 176, no pain, scaption: 130, pain/burning Goal status: ACHIEVED   PLAN: PT FREQUENCY: 1-2x/week  PT DURATION: 12 weeks  PLANNED INTERVENTIONS: Therapeutic exercises, Therapeutic activity, Neuromuscular re-education, Balance training, Gait training, Patient/Family education, Self Care, Joint mobilization, Joint manipulation, Vestibular training, Canalith repositioning, Orthotic/Fit training, DME instructions, Dry Needling, Electrical stimulation, Spinal manipulation, Spinal mobilization, Cryotherapy, Moist heat, Taping, Traction, Ultrasound, Ionotophoresis 4mg /ml Dexamethasone , Manual therapy, and Re-evaluation.  PLAN FOR NEXT SESSION: progress HEP, progress L shoulder ROM and strength per surgical protocol;  Sherill Ding Lasheba Stevens PT, DPT, GCS  4:47 PM,06/14/23

## 2023-06-14 ENCOUNTER — Ambulatory Visit

## 2023-06-14 DIAGNOSIS — M6281 Muscle weakness (generalized): Secondary | ICD-10-CM | POA: Diagnosis not present

## 2023-06-14 DIAGNOSIS — M25512 Pain in left shoulder: Secondary | ICD-10-CM | POA: Diagnosis not present

## 2023-06-14 DIAGNOSIS — G8929 Other chronic pain: Secondary | ICD-10-CM

## 2023-06-15 ENCOUNTER — Ambulatory Visit: Admitting: Urology

## 2023-06-20 ENCOUNTER — Other Ambulatory Visit: Payer: Self-pay | Admitting: Family Medicine

## 2023-06-20 DIAGNOSIS — F419 Anxiety disorder, unspecified: Secondary | ICD-10-CM

## 2023-06-21 NOTE — Telephone Encounter (Signed)
 Requested Prescriptions  Pending Prescriptions Disp Refills   busPIRone  (BUSPAR ) 10 MG tablet [Pharmacy Med Name: BUSPIRONE  10 MG TAB[*]] 360 tablet 1    Sig: TAKE ONE TABLET BY MOUTH FOUR TIMES A DAY AS NEEDED FOR ANIXETY     Psychiatry: Anxiolytics/Hypnotics - Non-controlled Failed - 06/21/2023  4:19 PM      Failed - Valid encounter within last 12 months    Recent Outpatient Visits           1 month ago Acute cystitis with hematuria   [place] [facility] Raina Bunting, DO       Future Appointments             In 2 weeks Stoioff, Kizzie Perks, MD [facility] [place]   In 1 month Romeo Co, Kayleen Party, DO [place] [facility]            Refused Prescriptions Disp Refills   DULoxetine  (CYMBALTA ) 60 MG capsule [Pharmacy Med Name: DULOXETINE  DR 60 MG CAP[*]] 90 capsule 1    Sig: TAKE ONE CAPSULE BY MOUTH ONE TIME DAILY     Psychiatry: Antidepressants - SNRI - duloxetine  Failed - 06/21/2023  4:19 PM      Failed - Last BP in normal range    BP Readings from Last 1 Encounters:  06/01/23 (!) 140/84         Failed - Valid encounter within last 6 months    Recent Outpatient Visits           1 month ago Acute cystitis with hematuria   [place] [facility], Kayleen Party, DO       Future Appointments             In 2 weeks Stoioff, Kizzie Perks, MD [facility] [place]   In 1 month Romeo Co, Kayleen Party, DO [place] [facility] [place]            Passed - Cr in normal range and within 360 days    Creatinine  Date Value Ref Range Status  02/28/2023 1.00 0.61 - 1.24 mg/dL Final   Creat  Date Value Ref Range Status  07/27/2021 1.13 0.70 - 1.35 mg/dL Final         Passed - eGFR is 30 or above and within 360 days    GFR, Est African American  Date Value Ref Range Status  07/21/2020 72 > OR = 60 mL/min/1.11m2 Final   GFR, Est Non African American   Date Value Ref Range Status  07/21/2020 62 > OR = 60 mL/min/1.78m2 Final   GFR, Estimated  Date Value Ref Range Status  02/28/2023 >60 >60 mL/min Final    Comment:    (NOTE) Calculated using the CKD-EPI Creatinine Equation (2021)    eGFR  Date Value Ref Range Status  08/31/2022 72 >59 mL/min/1.73 Final         Passed - Completed PHQ-2 or PHQ-9 in the last 360 days

## 2023-06-25 ENCOUNTER — Ambulatory Visit: Attending: Orthopedic Surgery

## 2023-06-25 DIAGNOSIS — M6281 Muscle weakness (generalized): Secondary | ICD-10-CM | POA: Insufficient documentation

## 2023-06-25 DIAGNOSIS — M25512 Pain in left shoulder: Secondary | ICD-10-CM | POA: Diagnosis not present

## 2023-06-25 DIAGNOSIS — G8929 Other chronic pain: Secondary | ICD-10-CM | POA: Diagnosis not present

## 2023-06-25 NOTE — Therapy (Signed)
 OUTPATIENT PHYSICAL THERAPY SHOULDER TREATMENT  Patient Name: [name] MRN: [id number] DOB:1955/02/25, 68 y.o., male Today's Date: 06/25/2023   PT End of Session - 06/25/23 1350     Visit Number 12    Number of Visits 25    Date for PT Re-Evaluation 08/20/23    Authorization Type eval: 02/26/23, [employer] Medicare, [id number] on AUTH    Authorization Time Period [facility] medicare 2024  [id number] on MN    PT Start Time 1400    PT Stop Time 1445    PT Time Calculation (min) 45 min    Activity Tolerance Patient tolerated treatment well    Behavior During Therapy WFL for tasks assessed/performed            Past Medical History:  Diagnosis Date   (HFpEF) heart failure with preserved ejection fraction (HCC)    a.) TTE 12/26/2013: EF 45-50%, mild ant and antsept HK. mild MR. Mod dil LA. nl RV fxn. Rhythm was Afib; b.) TTE 07/04/2019: EF 55%, mid-apical anteroseptal HK, mild MAC, mild Ao sclerosis, G2DD, RVSP 45.3; c.) TTE 06/02/2022: EF 55-60%, no RWMAs, sev LAE, RVSP 37.9, mild-mod MR, mild AR, AoV sclerosis without stenosis, asc Ao 38 mm   Anxiety    Aortic atherosclerosis (HCC)    Cardiomyopathy (in setting of Afib)    a.) TTE 12/26/2013: EF 45-50%; b.) TTE 07/04/2019: EF 55%; c.) TTE 06/02/2022: EF 55-60%   Chronic pain syndrome    a.) followed by pain management   Chronic, continuous use of opioids    a.) on COT; followed by pain management   Coronary artery disease 11/06/2014   a.) cCTA 11/06/2014: Ca2+ = 224 (74th %ile; LAD distribution)   Diverticulosis    DJD (degenerative joint disease) of knee    Former smoker    History of hiatal hernia    History of kidney stones 2012   Hyperlipidemia    Hyperplastic colon polyp    Hypertension    Inguinal hernia, left    Internal hemorrhoids    Intervertebral disc disorder with radiculopathy of lumbosacral region    Lesion of bone of lumbosacral spine    L5   Long term current use of amiodarone     Lower extremity edema    Mixed  hyperlipidemia    Morbid obesity (HCC)    Multiple falls    Myalgia due to statin    On rivaroxaban  therapy    Osteoarthritis    PAF (paroxysmal atrial fibrillation) (HCC)    a.) CHA2DS2VASc = 4 (age, HTN, CHF, vascular disease) as of 02/19/23;  b.) s/p DCCV 03/13/14 (200 J), 05/15/19 (150 J x 1, 200 J x2), 05/19/2022 (150 J x1, 200 J x2; pads changed to ant/lat postion with additional 200 J x 2 -> did not convert), 07/25/22 (200 J), 09/21/2022 (200 J); c.) s/p PVI ablation 11/17/2022; d.) rate/rhythm maintained on amiodarone ; chronically anticoagulated on rivaroxaban    Pernicious anemia    Prostate cancer (HCC)    Pulmonary nodule, right    a. 10/2014 Cardiac CTA: 7mm RLL nodule; b. 04/2015 CT Chest: stable 7mm RLL nodule. No new nodules; 02/2017 CTA Chest: stable, benign, 7mm RLL pulm nodule.   Rotator cuff tear, left    Sleep difficulties    a.) takes melatonin + trazodone  PRN   SVT (supraventricular tachycardia) (HCC)    Tubular adenoma of colon    Past Surgical History:  Procedure Laterality Date   ATRIAL FIBRILLATION ABLATION N/A 11/17/2022   Procedure: ATRIAL FIBRILLATION  ABLATION; Location: [facility]; Surgeon: Gehi, Anil, MD   BICEPT TENODESIS Right 09/27/2021   Procedure: Right reverse shoulder arthroplasty, biceps tenodesis;  Surgeon: Lorri Rota, MD;  Location: [facility];  Service: Orthopedics;  Laterality: Right;   CARDIOVERSION N/A 05/15/2019   Procedure: CARDIOVERSION;  Surgeon: Devorah Fonder, MD;  Location: [facility];  Service: Cardiovascular;  Laterality: N/A;   CARDIOVERSION N/A 03/13/2014   Procedure: CARDIOVERSION; Location: [facility]; Surgeon: Belva Boyden, MD   CARDIOVERSION N/A 03/24/2022   Procedure: CARDIOVERSION;  Surgeon: Devorah Fonder, MD;  Location: [facility];  Service: Cardiovascular;  Laterality: N/A;   CARDIOVERSION N/A 05/19/2022   Procedure: CARDIOVERSION;  Surgeon: Devorah Fonder, MD;  Location: [facility];  Service: Cardiovascular;  Laterality: N/A;    CARDIOVERSION N/A 07/25/2022   Procedure: CARDIOVERSION;  Surgeon: Verona Goodwill, MD;  Location: [facility];  Service: Cardiovascular;  Laterality: N/A;   CARDIOVERSION N/A 09/21/2022   Procedure: CARDIOVERSION;  Surgeon: Verona Goodwill, MD;  Location: [facility];  Service: Cardiovascular;  Laterality: N/A;   CARPAL TUNNEL RELEASE Right 12/23/2014   Procedure: CARPAL TUNNEL RELEASE;  Surgeon: Marlynn Singer, MD;  Location: [facility];  Service: Orthopedics;  Laterality: Right;   CARPAL TUNNEL RELEASE Left 01/06/2015   Procedure: CARPAL TUNNEL RELEASE;  Surgeon: Marlynn Singer, MD;  Location: [facility];  Service: Orthopedics;  Laterality: Left;   COLONOSCOPY WITH PROPOFOL  N/A 10/08/2017   Procedure: COLONOSCOPY WITH PROPOFOL ;  Surgeon: Selena Daily, MD;  Location: [facility];  Service: Gastroenterology;  Laterality: N/A;   COLONOSCOPY WITH PROPOFOL  N/A 06/07/2022   Procedure: COLONOSCOPY WITH PROPOFOL ;  Surgeon: Selena Daily, MD;  Location: [facility];  Service: Gastroenterology;  Laterality: N/A;   ENTEROSCOPY  06/07/2022   Procedure: ENTEROSCOPY;  Surgeon: Selena Daily, MD;  Location: [facility] ENDOSCOPY;  Service: Gastroenterology;;   ESOPHAGOGASTRODUODENOSCOPY (EGD) WITH PROPOFOL  N/A 06/07/2022   Procedure: ESOPHAGOGASTRODUODENOSCOPY (EGD) WITH PROPOFOL ;  Surgeon: Selena Daily, MD;  Location: [facility]COPY;  Service: Gastroenterology;  Laterality: N/A;   GASTRIC BYPASS  01/17/2000   KNEE SURGERY Right    knee trauma x3   prostate seeding     REVERSE SHOULDER ARTHROPLASTY Right 09/27/2021   Procedure: Right reverse shoulder arthroplasty, biceps tenodesis;  Surgeon: Lorri Rota, MD;  Location: [facility];  Service: Orthopedics;  Laterality: Right;   REVERSE SHOULDER ARTHROPLASTY Left 02/20/2023   Procedure: Left reverse shoulder arthroplasty, biceps tenodesis;  Surgeon: Lorri Rota, MD;  Location: [facility];  Service: Orthopedics;  Laterality: Left;   SHOULDER  ARTHROSCOPY WITH ROTATOR CUFF REPAIR AND OPEN BICEPS TENODESIS Left 12/27/2021   Procedure: Left shoulder arthroscopic cuff repair (supraspinatus and subscapularis) with Regeneten Patch application;  Surgeon: Lorri Rota, MD;  Location: [facility];  Service: Orthopedics;  Laterality: Left;   Patient Active Problem List   Diagnosis Date Noted   Symptomatic anemia 04/26/2022   Chronic pain of both knees 03/30/2022   Bilateral primary osteoarthritis of knee 03/30/2022   Shortness of breath 03/24/2022   Vitamin D  deficiency 02/24/2022   S/p reverse total shoulder arthroplasty 09/27/2021   Lesion of bone of lumbosacral spine (L5) 05/12/2021   Localized osteoarthritis of shoulder regions, bilateral 03/29/2021   Chronic pain of both shoulders 03/29/2021   Drug-induced myopathy 02/21/2021   Trigger finger of right hand 11/15/2020   Prostate cancer (HCC) 07/26/2020   Insomnia 08/01/2019   Primary osteoarthritis of both wrists 02/17/2019   Trigger middle finger of left hand 02/17/2019   Chronic pain syndrome 02/17/2019  Chronic radicular lumbar pain 10/08/2018   Lumbar radiculopathy 10/08/2018   Lumbar degenerative disc disease 10/08/2018   Lumbar facet arthropathy 10/08/2018   Lumbar facet joint syndrome 10/08/2018   Intervertebral disc disorder with radiculopathy of lumbosacral region    Osteoarthritis of knee 11/30/2017   Osteoarthritis of wrist 11/30/2017   Pernicious anemia 05/22/2016   Persistent atrial fibrillation (HCC) 12/20/2015   Carpal tunnel syndrome 11/16/2014   DJD (degenerative joint disease) of knee 10/27/2014   Bilateral carpal tunnel syndrome 10/27/2014   Morbid obesity with BMI of 45.0-49.9, adult (HCC) 10/27/2014   Mixed hyperlipidemia 10/27/2014   H/O gastric bypass 03/20/2014   Hyperkalemia 03/20/2014   History of prostate cancer 12/26/2013   Essential hypertension 12/26/2013   Encounter for anticoagulation discussion and counseling 12/26/2013    PCP: Dr.  Romeo Co  REFERRING PROVIDER: Dr. Lorri Rota  REFERRING DIAG: [phone] (ICD-10-CM) - Complete rotator cuff tear or rupture of left shoulder, not specified as traumatic   RATIONALE FOR EVALUATION AND TREATMENT: Rehabilitation  THERAPY DIAG: Chronic left shoulder pain  Muscle weakness (generalized)  ONSET DATE: 02/20/23  FOLLOW-UP APPT SCHEDULED WITH REFERRING PROVIDER: Yes   FROM INITIAL EVALUATION SUBJECTIVE:                                                                                                                                                                                         SUBJECTIVE STATEMENT:  L reverse TSR 02/20/23  PERTINENT HISTORY:  LEDON WEIHE is a 68 y.o. male who initially underwent arthroscopic subscapularis repair and side-to-side supraspinatus repair with Regeneten patch application by Dr. Lydia Sams 12/27/2021. He underwent post-surgical physical therapy with initial minimal improvement followed by worsening shoulder strength/function. Repeat MRI showed large, retracted subscapularis re-tear and supraspinatus re-tear with signs of rotator cuff arthropathy. Conservative measures including medications, physical therapy, and cortisone injections did not provide adequate relief. The patient elected to proceed with reverse shoulder arthroplasty and biceps tenodesis on 02/20/23. Intraoperative findings include retracted full-thickness subscapularis tear, partial-thickness supraspinatus tear, and biceps tendinopathy. Surgery was a standard deltopectoral incision under general anesthesia along with an interscalene block. Per op note, operative arm to remain in sling at all times except ROM exercises and hygiene. Can perform pendulums, elbow/wrist/hand ROM exercises. Passive ROM allowed to 90 FF and 30 ER. ASA 325mg  x 6 weeks for DVT ppx (pt states that he was actually instructed to start back on his Xarelto  and not to use ASA). Plan for PT starting on POD #3-4. Pt denies any  post-operative complications from surgery. Pt has a history of chronic back pain and has undergone multiple lumbar facet, medial branch radiofrequency ablations.  He also has a past history of cardiomyopathy (in setting of Afib), DJD (degenerative joint disease) of knee, kidney stones, lower extremity edema, PAF (paroxysmal atrial fibrillation), pernicious anemia, prostate cancer, and a pulmonary nodule. He also has a past surgical history that includes R reverse TSR, knee surgery, gastric bypass (2002), prostate seeding, carpal tunnel release (Right, 12/23/2014; Left, 01/06/2015), and multiple cardioversions.   PAIN:  Pain Intensity: No resting pain upon arrival Pain location: L shoulder Pain Quality: "Nerve pain" Radiating: No  Numbness/Tingling: No Focal Weakness: Yes 24-hour pain behavior: Varies History of prior shoulder or neck/shoulder injury, pain, surgery, or therapy: Yes PT for L shoulder s/p RTC repair, history of R reverse TSR; Dominant hand: right Red flags Positive: history of prostate CA, Negative: chills/fever, night sweats, nausea, vomiting, unrelenting pain, unexplained weight gain/loss  PRECAUTIONS: Shoulder, per operative note: operative arm to remain in sling at all times except ROM exercises and hygiene. Can perform pendulums, elbow/wrist/hand ROM exercises. Passive ROM allowed to 90 FF and 30 ER.   General reverse TSR precautions include no combined shoulder extension, IR, and adduction. No shoulder and elbow AROM (bicep tenodesis) for at least the first 4 weeks   WEIGHT BEARING RESTRICTIONS: Yes no WB through LUE  FALLS: Has patient fallen in last 6 months? 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  Recent memory is intact.  Remote memory is intact.  Attention span and concentration are intact.  Expressive speech is intact.  Patient's fund of knowledge is within normal limits for educational level.    Gross Musculoskeletal Assessment Tremor: None Bulk: Normal Tone: Normal Incision is clean and dry with minimal blood staining on honeycomb dressing as well as minimal swelling around the incision. Skin is cool to touch and no signs of infection present.   Gait Deferred full gait assessment  Posture Forward head  with upper thoracic kyphosis and rounded shoulders  Cervical Screen Deferred  ROM ROM (Normal range in degrees)    Right (AROM) Left (PROM)  Shoulder    Flexion 160 90 (limited per protocol)  Extension    Abduction 180 90 (scaption)  External Rotation 88 0  Internal Rotation 70 stomach  Hands Behind Head    Hands Behind Back        Elbow    Flexion Full Full  Extension 0 0  Pronation WNL Full palm up  Supination WNL Full palm down  (* = pain; Blank rows = not tested)  UE MMT: MMT (out of 5) Right Left   Shoulder   Flexion 5   Extension    Abduction 5   External rotation 5   Internal rotation 5   Horizontal abduction    Horizontal adduction    Lower Trapezius    Rhomboids        Elbow  Flexion 5   Extension 5   Pronation 5   Supination 5  Wrist  Flexion 5 5  Extension 5 5  Radial deviation  5  Ulnar deviation  5      MCP  Flexion 5 5  Extension 5 5  Abduction 5 5  Adduction 5 5  (* = pain; Blank rows = not  tested)  Sensation Deferred  Reflexes Deferred  Palpation No tenderness to light palpation around entire L shoulder girdle;  Passive Accessory Intervertebral Motion Deferred  Accessory Motions/Glides Deferred  Muscle Length Testing Deferred  SPECIAL TESTS Deferred  Beighton scale Deferred   TODAY'S TREATMENT: 06/14/23   Subjective: Patient reports ongoing L shoulder stiffness/soreness and ache. He had his lumbar RFA with Dr. Rhesa Celeste and has yet to achieve full benefit which usually takes multiple weeks. Saw electro cardiology PA and DC amiodarone  without any additional episodes of afib.  No more episodes of L shoulder instability/subluxation. No specific questions currently.    PAIN: Burning/ache    Ther-ex  In semi-recumbent position: L shoulder flexion with 3# dumbbell 2 x 10; L shoulder scaption with 3# DB 2 x 10; L shoulder overhead press with 3# DB 2 x 10; L shoulder manually resisted ER 2 x 10; L shoulder manually resisted IR 2 x 10; L elbow manually resisted flexion/extension 2 x 10 each; L forearm manually resisted pronation/supination x 10 each; Dowel chest press with manual resistance from therapist 2 x 10; Nautilus lat pull downs 80# 3 x 10; Nautilus rows 40# 3 x 10; Standing green tband resisted shoulder IR and ER 2 x 10 each; Ice pack applied to L shoulder at end of session (unbilled);   HOME EXERCISE PROGRAM:  Access Code: [email or web address] URL: [email or web address] Date: 02/28/2023 Prepared by: Crawford Dock  Exercises - Seated Cervical Sidebending Stretch (Mirrored)  - 2 x daily - 7 x weekly - 3 reps - 30s hold - Seated Scapular Retraction  - 2 x daily - 7 x weekly - 2 sets - 10 reps - 3s hold - Circular Shoulder Pendulum with Table Support  - 2 x daily - 7 x weekly - 3 reps - 60s hold - Horizontal Shoulder Pendulum with Table Support  - 2 x daily - 7 x weekly - 3 reps - 60s hold - Flexion-Extension Shoulder Pendulum with Table  Support (Mirrored)  - 2 x daily - 7 x weekly - 3 reps - 60s hold - Wrist Flexion AROM  - 2 x daily - 7 x weekly - 2 sets - 10 reps - 3s hold - Wrist Extension AROM  - 1 x daily - 7 x weekly - 2 sets - 10 reps - 3s hold    ASSESSMENT: No notable pain reported during therapy session today but muscle fatigue reported. He continues with most notable weakness in L shoulder scaption/abduction and ER. Continued strengthening on the Nautilus machine today. Pt encouraged to continue HEP and follow up as directed. He will continue to benefit from skilled therapy to address remaining deficits in order to improve quality of life and return to PLOF.     OBJECTIVE IMPAIRMENTS: decreased ROM, decreased strength, and pain.   ACTIVITY LIMITATIONS: carrying, lifting, bathing, toileting, dressing, and reach over head  PARTICIPATION LIMITATIONS: meal prep, cleaning, laundry, driving, shopping, community activity, and yard work  PERSONAL FACTORS: Past/current experiences, Time since onset of injury/illness/exacerbation, and 3+ comorbidities: OA, anemia, anxiety, cardiomyopathy, chronic pain are also affecting patient's functional outcome.   REHAB POTENTIAL: Good  CLINICAL DECISION MAKING: Unstable/unpredictable  EVALUATION COMPLEXITY: High   GOALS: Goals reviewed  with patient? Yes  SHORT TERM GOALS: Target date: 07/09/2023   Pt will be independent with HEP to improve strength and decrease shoulder pain to improve pain-free function at home and work. Baseline: Issued and progressing Goal status: ONGOING   LONG TERM GOALS: Target date: 08/20/2023  1.  Pt will report no further L shoulder pain with all AROM in order to complete all ADLs and improve pain-free function. Baseline: 05/28/23: worst pain 7/10; Goal status: ONGOING  2.  Pt will decrease QuickDASH score to below 30% in order to demonstrate clinically significant reduction in disability related to shoulder pain        Baseline: 02/26/23: 72.7%;  05/28/23: 40.9% Goal status: REVISED  3. Pt will increase strength of L shoulder flexion and scaption to at least 4/5 in order to demonstrate improvement in strength and function         Baseline: 02/26/23: Unable to test; 05/28/23: flexion: 4+/5, pain free, scaption: 4/5, pain Goal status: ACHIEVED  4. Pt will improve L shoulder AROM flexion and scaption to at least 110 degrees in order to demonstrate improvement in function so he can take care of his property and complete all household responsibilities;        Baseline: 02/26/23: Unable to test (90 degrees PROM for both), 05/28/23: L shoulder AROM against gravity: flexion: 176, no pain, scaption: 130, pain/burning Goal status: ACHIEVED   PLAN: PT FREQUENCY: 1-2x/week  PT DURATION: 12 weeks  PLANNED INTERVENTIONS: Therapeutic exercises, Therapeutic activity, Neuromuscular re-education, Balance training, Gait training, Patient/Family education, Self Care, Joint mobilization, Joint manipulation, Vestibular training, Canalith repositioning, Orthotic/Fit training, DME instructions, Dry Needling, Electrical stimulation, Spinal manipulation, Spinal mobilization, Cryotherapy, Moist heat, Taping, Traction, Ultrasound, Ionotophoresis 4mg /ml Dexamethasone , Manual therapy, and Re-evaluation.  PLAN FOR NEXT SESSION: progress HEP, progress L shoulder ROM and strength per surgical protocol;  Sherill Ding Diontre Harps PT, DPT, GCS  2:13 PM,06/25/23

## 2023-06-27 ENCOUNTER — Encounter: Payer: Self-pay | Admitting: Internal Medicine

## 2023-06-27 ENCOUNTER — Inpatient Hospital Stay: Payer: Medicare Other | Attending: Internal Medicine

## 2023-06-27 ENCOUNTER — Inpatient Hospital Stay: Payer: Medicare Other | Admitting: Internal Medicine

## 2023-06-27 ENCOUNTER — Inpatient Hospital Stay: Payer: Medicare Other

## 2023-06-27 VITALS — BP 149/90 | HR 98 | Temp 96.3°F | Resp 20 | Wt 353.0 lb

## 2023-06-27 VITALS — BP 142/82 | HR 97 | Temp 97.4°F | Resp 18

## 2023-06-27 DIAGNOSIS — I4891 Unspecified atrial fibrillation: Secondary | ICD-10-CM | POA: Insufficient documentation

## 2023-06-27 DIAGNOSIS — Z7901 Long term (current) use of anticoagulants: Secondary | ICD-10-CM | POA: Insufficient documentation

## 2023-06-27 DIAGNOSIS — Z87891 Personal history of nicotine dependence: Secondary | ICD-10-CM | POA: Insufficient documentation

## 2023-06-27 DIAGNOSIS — E538 Deficiency of other specified B group vitamins: Secondary | ICD-10-CM | POA: Diagnosis not present

## 2023-06-27 DIAGNOSIS — D649 Anemia, unspecified: Secondary | ICD-10-CM | POA: Diagnosis not present

## 2023-06-27 DIAGNOSIS — I2510 Atherosclerotic heart disease of native coronary artery without angina pectoris: Secondary | ICD-10-CM | POA: Diagnosis not present

## 2023-06-27 DIAGNOSIS — Z9884 Bariatric surgery status: Secondary | ICD-10-CM | POA: Insufficient documentation

## 2023-06-27 DIAGNOSIS — E669 Obesity, unspecified: Secondary | ICD-10-CM | POA: Diagnosis not present

## 2023-06-27 DIAGNOSIS — Z79899 Other long term (current) drug therapy: Secondary | ICD-10-CM | POA: Diagnosis not present

## 2023-06-27 DIAGNOSIS — Z87442 Personal history of urinary calculi: Secondary | ICD-10-CM | POA: Insufficient documentation

## 2023-06-27 LAB — CBC WITH DIFFERENTIAL (CANCER CENTER ONLY)
Abs Immature Granulocytes: 0.02 10*3/uL (ref 0.00–0.07)
Basophils Absolute: 0 10*3/uL (ref 0.0–0.1)
Basophils Relative: 0 %
Eosinophils Absolute: 0.1 10*3/uL (ref 0.0–0.5)
Eosinophils Relative: 1 %
HCT: 44.5 % (ref 39.0–52.0)
Hemoglobin: 14.5 g/dL (ref 13.0–17.0)
Immature Granulocytes: 0 %
Lymphocytes Relative: 26 %
Lymphs Abs: 1.8 10*3/uL (ref 0.7–4.0)
MCH: 28.2 pg (ref 26.0–34.0)
MCHC: 32.6 g/dL (ref 30.0–36.0)
MCV: 86.4 fL (ref 80.0–100.0)
Monocytes Absolute: 0.7 10*3/uL (ref 0.1–1.0)
Monocytes Relative: 10 %
Neutro Abs: 4.5 10*3/uL (ref 1.7–7.7)
Neutrophils Relative %: 63 %
Platelet Count: 192 10*3/uL (ref 150–400)
RBC: 5.15 MIL/uL (ref 4.22–5.81)
RDW: 14.4 % (ref 11.5–15.5)
WBC Count: 7.1 10*3/uL (ref 4.0–10.5)
nRBC: 0 % (ref 0.0–0.2)

## 2023-06-27 LAB — BASIC METABOLIC PANEL WITH GFR
Anion gap: 7 (ref 5–15)
BUN: 15 mg/dL (ref 8–23)
CO2: 24 mmol/L (ref 22–32)
Calcium: 8.5 mg/dL — ABNORMAL LOW (ref 8.9–10.3)
Chloride: 110 mmol/L (ref 98–111)
Creatinine, Ser: 1 mg/dL (ref 0.61–1.24)
GFR, Estimated: >60 mL/min (ref >60)
Glucose, Bld: 103 mg/dL — ABNORMAL HIGH (ref 70–99)
Potassium: 4.2 mmol/L (ref 3.5–5.1)
Sodium: 141 mmol/L (ref 135–145)

## 2023-06-27 LAB — IRON AND TIBC
Iron: 75 ug/dL (ref 45–182)
Saturation Ratios: 21 % (ref 17.9–39.5)
TIBC: 354 ug/dL (ref 250–450)
UIBC: 279 ug/dL

## 2023-06-27 LAB — VITAMIN D 25 HYDROXY (VIT D DEFICIENCY, FRACTURES): Vit D, 25-Hydroxy: 30.79 ng/mL (ref 30–100)

## 2023-06-27 LAB — FERRITIN: Ferritin: 26 ng/mL (ref 24–336)

## 2023-06-27 MED ORDER — IRON SUCROSE 20 MG/ML IV SOLN
200.0000 mg | Freq: Once | INTRAVENOUS | Status: AC
  Administered 2023-06-27: 200 mg via INTRAVENOUS
  Filled 2023-06-27: qty 10

## 2023-06-27 NOTE — Assessment & Plan Note (Addendum)
#   Anemia- Hb-symptomatic.  Likely due to iron  deficiency -likely secondary to malabsorption/Hx of gastric bypass.  Status post IV iron  infusion. Did not tolerate  barimelt+ iron . Proceed with venofer  today.  #Etiology of iron  deficiency: Malabsorption/gastric bypass. Incidental CT February 2024-  possible mucosal lesion at the distal small bowel anastomosis status post gastric bypass. S/p  EGD/colo/ SBE [Dr.Vanga]-  stable.   # B12 def [PCP]- home b12 injections-   # CAD/A.fib-s/p ablation [Dec 2024- [facility]] on Xarelto /amiodarone . Stable;     # Hypocalcemia- on ca+ Vit D- levels today.   # Obesity- not on GLP-1 pills.   # DISPOSITION: # venofer  today # venofer  monthly x 3- # follow up in 6 months- MD: labs- cbc;bmp; iron  studies; ferritin; vit D 25-OH possible venofer -Dr.B

## 2023-06-27 NOTE — Progress Notes (Signed)
 I connected with [name] on 06/27/23 at  1:45 PM EDT by video enabled telemedicine visit and verified that I am speaking with the correct person using two identifiers.  I discussed the limitations, risks, security and privacy concerns of performing an evaluation and management service by telemedicine and the availability of in-person appointments. I also discussed with the patient that there may be a patient responsible charge related to this service. The patient expressed understanding and agreed to proceed.    Other persons participating in the visit and their role in the encounter: RN/medical reconciliation Patient's location: office Provider's location: home  Oncology History   No history exists.     Chief Complaint: anemia    History of present illness:[name] 68 y.o.  male with history of iron  deficiency anemia secondary gastric bypass, A-fib on Xarelto  is here for follow-up.   Patient notes to have improvement of energy levels after iron  infusions. Pt in for follow up, reports being tired and has no energy.  Patient's last infusion was approximately 4 months ago.  He continues to get B12 injections at home.   Observation/objective: Alert & oriented x 3. In No acute distress.   Assessment and plan: Symptomatic anemia # Anemia- Hb-symptomatic.  Likely due to iron  deficiency -likely secondary to malabsorption/Hx of gastric bypass.  Status post IV iron  infusion. Did not tolerate  barimelt+ iron . Proceed with venofer  today.  #Etiology of iron  deficiency: Malabsorption/gastric bypass. Incidental CT February 2024-  possible mucosal lesion at the distal small bowel anastomosis status post gastric bypass. S/p  EGD/colo/ SBE [Dr.Vanga]-  stable.   # B12 def [PCP]- home b12 injections-   # CAD/A.fib-s/p ablation [Dec 2024- [facility]] on Xarelto /amiodarone . Stable;     # Hypocalcemia- on ca+ Vit D- levels today.   # Obesity- not on GLP-1 pills.   # DISPOSITION: # venofer   today # venofer  monthly x 3- # follow up in 6 months- MD: labs- cbc;bmp; iron  studies; ferritin; vit D 25-OH possible venofer -Dr.B  Follow-up instructions:  I discussed the assessment and treatment plan with the patient.  The patient was provided an opportunity to ask questions and all were answered.  The patient agreed with the plan and demonstrated understanding of instructions.  The patient was advised to call back or seek an in person evaluation if the symptoms worsen or if the condition fails to improve as anticipated.    Dr. Jawanda Passey [facility] at [facility] 06/27/2023 2:41 PM

## 2023-06-27 NOTE — Progress Notes (Signed)
 Pt in for follow up, reports being tired and has no energy.  Pt states stays in med all the time.

## 2023-07-02 ENCOUNTER — Ambulatory Visit

## 2023-07-02 DIAGNOSIS — M25512 Pain in left shoulder: Secondary | ICD-10-CM | POA: Diagnosis not present

## 2023-07-02 DIAGNOSIS — M6281 Muscle weakness (generalized): Secondary | ICD-10-CM | POA: Diagnosis not present

## 2023-07-02 DIAGNOSIS — G8929 Other chronic pain: Secondary | ICD-10-CM | POA: Diagnosis not present

## 2023-07-02 NOTE — Therapy (Unsigned)
 OUTPATIENT PHYSICAL THERAPY SHOULDER TREATMENT  Patient Name: [name] MRN: [id number] DOB:1955/05/10, 68 y.o., male Today's Date: 07/03/2023   PT End of Session - 07/02/23 1447     Visit Number 13    Number of Visits 25    Date for PT Re-Evaluation 08/20/23    Authorization Type eval: 02/26/23, [employer] Medicare, [id number] on AUTH    Authorization Time Period [facility] medicare 2024  [id number] on [place]    PT Start Time 1402    PT Stop Time 1445    PT Time Calculation (min) 43 min    Activity Tolerance Patient tolerated treatment well    Behavior During Therapy WFL for tasks assessed/performed         Past Medical History:  Diagnosis Date   (HFpEF) heart failure with preserved ejection fraction (HCC)    a.) TTE 12/26/2013: EF 45-50%, mild ant and antsept HK. mild MR. Mod dil LA. nl RV fxn. Rhythm was Afib; b.) TTE 07/04/2019: EF 55%, mid-apical anteroseptal HK, mild MAC, mild Ao sclerosis, G2DD, RVSP 45.3; c.) TTE 06/02/2022: EF 55-60%, no RWMAs, sev LAE, RVSP 37.9, mild-mod MR, mild AR, AoV sclerosis without stenosis, asc Ao 38 mm   Anxiety    Aortic atherosclerosis (HCC)    Cardiomyopathy (in setting of Afib)    a.) TTE 12/26/2013: EF 45-50%; b.) TTE 07/04/2019: EF 55%; c.) TTE 06/02/2022: EF 55-60%   Chronic pain syndrome    a.) followed by pain management   Chronic, continuous use of opioids    a.) on COT; followed by pain management   Coronary artery disease 11/06/2014   a.) cCTA 11/06/2014: Ca2+ = 224 (74th %ile; LAD distribution)   Diverticulosis    DJD (degenerative joint disease) of knee    Former smoker    History of hiatal hernia    History of kidney stones 2012   Hyperlipidemia    Hyperplastic colon polyp    Hypertension    Inguinal hernia, left    Internal hemorrhoids    Intervertebral disc disorder with radiculopathy of lumbosacral region    Lesion of bone of lumbosacral spine    L5   Long term current use of amiodarone     Lower extremity edema    Mixed  hyperlipidemia    Morbid obesity (HCC)    Multiple falls    Myalgia due to statin    On rivaroxaban  therapy    Osteoarthritis    PAF (paroxysmal atrial fibrillation) (HCC)    a.) CHA2DS2VASc = 4 (age, HTN, CHF, vascular disease) as of 02/19/23;  b.) s/p DCCV 03/13/14 (200 J), 05/15/19 (150 J x 1, 200 J x2), 05/19/2022 (150 J x1, 200 J x2; pads changed to ant/lat postion with additional 200 J x 2 -> did not convert), 07/25/22 (200 J), 09/21/2022 (200 J); c.) s/p PVI ablation 11/17/2022; d.) rate/rhythm maintained on amiodarone ; chronically anticoagulated on rivaroxaban    Pernicious anemia    Prostate cancer (HCC)    Pulmonary nodule, right    a. 10/2014 Cardiac CTA: 7mm RLL nodule; b. 04/2015 CT Chest: stable 7mm RLL nodule. No new nodules; 02/2017 CTA Chest: stable, benign, 7mm RLL pulm nodule.   Rotator cuff tear, left    Sleep difficulties    a.) takes melatonin + trazodone  PRN   SVT (supraventricular tachycardia) (HCC)    Tubular adenoma of colon    Past Surgical History:  Procedure Laterality Date   ATRIAL FIBRILLATION ABLATION N/A 11/17/2022   Procedure: ATRIAL FIBRILLATION ABLATION; Location: [facility];  Surgeon: Gehi, Anil, MD   BICEPT TENODESIS Right 09/27/2021   Procedure: Right reverse shoulder arthroplasty, biceps tenodesis;  Surgeon: Lorri Rota, MD;  Location: [facility];  Service: Orthopedics;  Laterality: Right;   CARDIOVERSION N/A 05/15/2019   Procedure: CARDIOVERSION;  Surgeon: Devorah Fonder, MD;  Location: [facility];  Service: Cardiovascular;  Laterality: N/A;   CARDIOVERSION N/A 03/13/2014   Procedure: CARDIOVERSION; Location: [facility]; Surgeon: Belva Boyden, MD   CARDIOVERSION N/A 03/24/2022   Procedure: CARDIOVERSION;  Surgeon: Devorah Fonder, MD;  Location: [facility];  Service: Cardiovascular;  Laterality: N/A;   CARDIOVERSION N/A 05/19/2022   Procedure: CARDIOVERSION;  Surgeon: Devorah Fonder, MD;  Location: [facility];  Service: Cardiovascular;  Laterality: N/A;    CARDIOVERSION N/A 07/25/2022   Procedure: CARDIOVERSION;  Surgeon: Verona Goodwill, MD;  Location: [facility];  Service: Cardiovascular;  Laterality: N/A;   CARDIOVERSION N/A 09/21/2022   Procedure: CARDIOVERSION;  Surgeon: Verona Goodwill, MD;  Location: [facility];  Service: Cardiovascular;  Laterality: N/A;   CARPAL TUNNEL RELEASE Right 12/23/2014   Procedure: CARPAL TUNNEL RELEASE;  Surgeon: Marlynn Singer, MD;  Location: [facility];  Service: Orthopedics;  Laterality: Right;   CARPAL TUNNEL RELEASE Left 01/06/2015   Procedure: CARPAL TUNNEL RELEASE;  Surgeon: Marlynn Singer, MD;  Location: [facility];  Service: Orthopedics;  Laterality: Left;   COLONOSCOPY WITH PROPOFOL  N/A 10/08/2017   Procedure: COLONOSCOPY WITH PROPOFOL ;  Surgeon: Selena Daily, MD;  Location: [facility];  Service: Gastroenterology;  Laterality: N/A;   COLONOSCOPY WITH PROPOFOL  N/A 06/07/2022   Procedure: COLONOSCOPY WITH PROPOFOL ;  Surgeon: Selena Daily, MD;  Location: [facility];  Service: Gastroenterology;  Laterality: N/A;   ENTEROSCOPY  06/07/2022   Procedure: ENTEROSCOPY;  Surgeon: Selena Daily, MD;  Location: [facility];  Service: Gastroenterology;;   ESOPHAGOGASTRODUODENOSCOPY (EGD) WITH PROPOFOL  N/A 06/07/2022   Procedure: ESOPHAGOGASTRODUODENOSCOPY (EGD) WITH PROPOFOL ;  Surgeon: Selena Daily, MD;  Location: [facility];  Service: Gastroenterology;  Laterality: N/A;   GASTRIC BYPASS  01/17/2000   KNEE SURGERY Right    knee trauma x3   prostate seeding     REVERSE SHOULDER ARTHROPLASTY Right 09/27/2021   Procedure: Right reverse shoulder arthroplasty, biceps tenodesis;  Surgeon: Lorri Rota, MD;  Location: [facility];  Service: Orthopedics;  Laterality: Right;   REVERSE SHOULDER ARTHROPLASTY Left 02/20/2023   Procedure: Left reverse shoulder arthroplasty, biceps tenodesis;  Surgeon: Lorri Rota, MD;  Location: [facility];  Service: Orthopedics;  Laterality: Left;   SHOULDER  ARTHROSCOPY WITH ROTATOR CUFF REPAIR AND OPEN BICEPS TENODESIS Left 12/27/2021   Procedure: Left shoulder arthroscopic cuff repair (supraspinatus and subscapularis) with Regeneten Patch application;  Surgeon: Lorri Rota, MD;  Location: [facility];  Service: Orthopedics;  Laterality: Left;   Patient Active Problem List   Diagnosis Date Noted   Symptomatic anemia 04/26/2022   Chronic pain of both knees 03/30/2022   Bilateral primary osteoarthritis of knee 03/30/2022   Shortness of breath 03/24/2022   Vitamin D  deficiency 02/24/2022   S/p reverse total shoulder arthroplasty 09/27/2021   Lesion of bone of lumbosacral spine (L5) 05/12/2021   Localized osteoarthritis of shoulder regions, bilateral 03/29/2021   Chronic pain of both shoulders 03/29/2021   Drug-induced myopathy 02/21/2021   Trigger finger of right hand 11/15/2020   Prostate cancer (HCC) 07/26/2020   Insomnia 08/01/2019   Primary osteoarthritis of both wrists 02/17/2019   Trigger middle finger of left hand 02/17/2019   Chronic pain syndrome 02/17/2019   Chronic  radicular lumbar pain 10/08/2018   Lumbar radiculopathy 10/08/2018   Lumbar degenerative disc disease 10/08/2018   Lumbar facet arthropathy 10/08/2018   Lumbar facet joint syndrome 10/08/2018   Intervertebral disc disorder with radiculopathy of lumbosacral region    Osteoarthritis of knee 11/30/2017   Osteoarthritis of wrist 11/30/2017   Pernicious anemia 05/22/2016   Persistent atrial fibrillation (HCC) 12/20/2015   Carpal tunnel syndrome 11/16/2014   DJD (degenerative joint disease) of knee 10/27/2014   Bilateral carpal tunnel syndrome 10/27/2014   Morbid obesity with BMI of 45.0-49.9, adult (HCC) 10/27/2014   Mixed hyperlipidemia 10/27/2014   H/O gastric bypass 03/20/2014   Hyperkalemia 03/20/2014   History of prostate cancer 12/26/2013   Essential hypertension 12/26/2013   Encounter for anticoagulation discussion and counseling 12/26/2013   PCP: Dr.  Romeo Co  REFERRING PROVIDER: Dr. Lorri Rota  REFERRING DIAG: [phone] (ICD-10-CM) - Complete rotator cuff tear or rupture of left shoulder, not specified as traumatic   RATIONALE FOR EVALUATION AND TREATMENT: Rehabilitation  THERAPY DIAG: Chronic left shoulder pain  Muscle weakness (generalized)  ONSET DATE: 02/20/23  FOLLOW-UP APPT SCHEDULED WITH REFERRING PROVIDER: Yes   FROM INITIAL EVALUATION SUBJECTIVE:                                                                                                                                                                                         SUBJECTIVE STATEMENT:  L reverse TSR 02/20/23  PERTINENT HISTORY:  CAMDAN BURDI is a 68 y.o. male who initially underwent arthroscopic subscapularis repair and side-to-side supraspinatus repair with Regeneten patch application by Dr. Lydia Sams 12/27/2021. He underwent post-surgical physical therapy with initial minimal improvement followed by worsening shoulder strength/function. Repeat MRI showed large, retracted subscapularis re-tear and supraspinatus re-tear with signs of rotator cuff arthropathy. Conservative measures including medications, physical therapy, and cortisone injections did not provide adequate relief. The patient elected to proceed with reverse shoulder arthroplasty and biceps tenodesis on 02/20/23. Intraoperative findings include retracted full-thickness subscapularis tear, partial-thickness supraspinatus tear, and biceps tendinopathy. Surgery was a standard deltopectoral incision under general anesthesia along with an interscalene block. Per op note, operative arm to remain in sling at all times except ROM exercises and hygiene. Can perform pendulums, elbow/wrist/hand ROM exercises. Passive ROM allowed to 90 FF and 30 ER. ASA 325mg  x 6 weeks for DVT ppx (pt states that he was actually instructed to start back on his Xarelto  and not to use ASA). Plan for PT starting on POD #3-4. Pt denies any  post-operative complications from surgery. Pt has a history of chronic back pain and has undergone multiple lumbar facet, medial branch radiofrequency ablations. He also  has a past history of cardiomyopathy (in setting of Afib), DJD (degenerative joint disease) of knee, kidney stones, lower extremity edema, PAF (paroxysmal atrial fibrillation), pernicious anemia, prostate cancer, and a pulmonary nodule. He also has a past surgical history that includes R reverse TSR, knee surgery, gastric bypass (2002), prostate seeding, carpal tunnel release (Right, 12/23/2014; Left, 01/06/2015), and multiple cardioversions.   PAIN:  Pain Intensity: No resting pain upon arrival Pain location: L shoulder Pain Quality: Nerve pain Radiating: No  Numbness/Tingling: No Focal Weakness: Yes 24-hour pain behavior: Varies History of prior shoulder or neck/shoulder injury, pain, surgery, or therapy: Yes PT for L shoulder s/p RTC repair, history of R reverse TSR; Dominant hand: right Red flags Positive: history of prostate CA, Negative: chills/fever, night sweats, nausea, vomiting, unrelenting pain, unexplained weight gain/loss  PRECAUTIONS: Shoulder, per operative note: operative arm to remain in sling at all times except ROM exercises and hygiene. Can perform pendulums, elbow/wrist/hand ROM exercises. Passive ROM allowed to 90 FF and 30 ER.   General reverse TSR precautions include no combined shoulder extension, IR, and adduction. No shoulder and elbow AROM (bicep tenodesis) for at least the first 4 weeks   WEIGHT BEARING RESTRICTIONS: Yes no WB through LUE  FALLS: Has patient fallen in last 6 months? 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  Recent memory is intact.  Remote memory is intact.  Attention span and concentration are intact.  Expressive speech is intact.  Patient's fund of knowledge is within normal limits for educational level.    Gross Musculoskeletal Assessment Tremor: None Bulk: Normal Tone: Normal Incision is clean and dry with minimal blood staining on honeycomb dressing as well as minimal swelling around the incision. Skin is cool to touch and no signs of infection present.   Gait Deferred full gait assessment  Posture Forward head  with upper thoracic kyphosis and rounded shoulders  Cervical Screen Deferred  ROM ROM (Normal range in degrees)    Right (AROM) Left (PROM)  Shoulder    Flexion 160 90 (limited per protocol)  Extension    Abduction 180 90 (scaption)  External Rotation 88 0  Internal Rotation 70 stomach  Hands Behind Head    Hands Behind Back        Elbow    Flexion Full Full  Extension 0 0  Pronation WNL Full palm up  Supination WNL Full palm down  (* = pain; Blank rows = not tested)  UE MMT: MMT (out of 5) Right Left   Shoulder   Flexion 5   Extension    Abduction 5   External rotation 5   Internal rotation 5   Horizontal abduction    Horizontal adduction    Lower Trapezius    Rhomboids        Elbow  Flexion 5   Extension 5   Pronation 5   Supination 5       Wrist  Flexion 5 5  Extension 5 5  Radial deviation  5  Ulnar deviation  5      MCP  Flexion 5 5  Extension 5 5  Abduction 5 5  Adduction 5 5  (* = pain; Blank rows = not  tested)  Sensation Deferred  Reflexes Deferred  Palpation No tenderness to light palpation around entire L shoulder girdle;  Passive Accessory Intervertebral Motion Deferred  Accessory Motions/Glides Deferred  Muscle Length Testing Deferred  SPECIAL TESTS Deferred  Beighton scale Deferred    TODAY'S TREATMENT: 07/02/23   Subjective: Patient reports ongoing constant L shoulder stiffness/soreness and ache. He does not mention any more episodes of L shoulder instability/subluxation. Slight improvement in energy following last transfusion. No specific questions currently.    PAIN: Burning/ache    Ther-ex  UBE x 4 minutes (2 minutes forward/2 minutes backward) at start of session during interval history (2 minutes unbilled); In semi-recumbent position: L shoulder flexion with 4# dumbbell 2 x 10; L shoulder scaption with 4# DB 2 x 10; L shoulder overhead press with 4# DB 2 x 10; L shoulder manually resisted ER 2 x 10; L shoulder manually resisted IR 2 x 10; L elbow manually resisted flexion/extension 2 x 10 each; Dowel chest press with manual resistance from therapist 2 x 10; Seated dowel rows with manual resistance from therapist 2 x 10; Nautilus lat pull downs with hand grips 95# 3 x 10; Nautilus rows with hand grips 50# 3 x 10; Ice pack applied to L shoulder at end of session (unbilled);   HOME EXERCISE PROGRAM:  Access Code: [email or web address] URL: [email or web address] Date: 02/28/2023 Prepared by: Crawford Dock  Exercises - Seated Cervical Sidebending Stretch (Mirrored)  - 2 x daily - 7 x weekly - 3 reps - 30s hold - Seated Scapular Retraction  - 2 x daily - 7 x weekly - 2 sets - 10 reps - 3s hold - Circular Shoulder Pendulum with Table Support  - 2 x daily - 7 x weekly - 3 reps - 60s hold - Horizontal Shoulder Pendulum with Table Support  - 2 x daily - 7 x weekly - 3 reps - 60s hold - Flexion-Extension Shoulder Pendulum with Table Support  (Mirrored)  - 2 x daily - 7 x weekly - 3 reps - 60s hold - Wrist Flexion AROM  - 2 x daily - 7 x weekly - 2 sets - 10 reps - 3s hold - Wrist Extension AROM  - 1 x daily - 7 x weekly - 2 sets - 10 reps - 3s hold    ASSESSMENT: No worsening shoulder pain reported during therapy session today but muscle fatigue reported. He continues with most notable weakness in L shoulder scaption/abduction and ER. Continued strengthening and increased weight on Nautilus exercises. Pt encouraged to continue HEP and follow up as directed. He will continue to benefit from skilled therapy to address remaining deficits in order to improve quality of life and return to PLOF.     OBJECTIVE IMPAIRMENTS: decreased ROM, decreased strength, and pain.   ACTIVITY LIMITATIONS: carrying, lifting, bathing, toileting, dressing, and reach over head  PARTICIPATION LIMITATIONS: meal prep, cleaning, laundry, driving, shopping, community activity, and yard work  PERSONAL FACTORS: Past/current experiences, Time since onset of injury/illness/exacerbation, and 3+ comorbidities: OA, anemia, anxiety, cardiomyopathy, chronic pain are also affecting patient's functional outcome.   REHAB POTENTIAL: Good  CLINICAL DECISION MAKING: Unstable/unpredictable  EVALUATION COMPLEXITY: High   GOALS: Goals reviewed with patient? Yes  SHORT  TERM GOALS: Target date: 07/09/2023   Pt will be independent with HEP to improve strength and decrease shoulder pain to improve pain-free function at home and work. Baseline: Issued and progressing Goal status: ONGOING   LONG TERM GOALS: Target date: 08/20/2023  1.  Pt will report no further L shoulder pain with all AROM in order to complete all ADLs and improve pain-free function. Baseline: 05/28/23: worst pain 7/10; Goal status: ONGOING  2.  Pt will decrease QuickDASH score to below 30% in order to demonstrate clinically significant reduction in disability related to shoulder pain        Baseline:  02/26/23: 72.7%; 05/28/23: 40.9% Goal status: REVISED  3. Pt will increase strength of L shoulder flexion and scaption to at least 4/5 in order to demonstrate improvement in strength and function         Baseline: 02/26/23: Unable to test; 05/28/23: flexion: 4+/5, pain free, scaption: 4/5, pain Goal status: ACHIEVED  4. Pt will improve L shoulder AROM flexion and scaption to at least 110 degrees in order to demonstrate improvement in function so he can take care of his property and complete all household responsibilities;        Baseline: 02/26/23: Unable to test (90 degrees PROM for both), 05/28/23: L shoulder AROM against gravity: flexion: 176, no pain, scaption: 130, pain/burning Goal status: ACHIEVED   PLAN: PT FREQUENCY: 1-2x/week  PT DURATION: 12 weeks  PLANNED INTERVENTIONS: Therapeutic exercises, Therapeutic activity, Neuromuscular re-education, Balance training, Gait training, Patient/Family education, Self Care, Joint mobilization, Joint manipulation, Vestibular training, Canalith repositioning, Orthotic/Fit training, DME instructions, Dry Needling, Electrical stimulation, Spinal manipulation, Spinal mobilization, Cryotherapy, Moist heat, Taping, Traction, Ultrasound, Ionotophoresis 4mg /ml Dexamethasone , Manual therapy, and Re-evaluation.  PLAN FOR NEXT SESSION: progress HEP, progress L shoulder ROM and strength per surgical protocol;  Sherill Ding Orville Mena PT, DPT, GCS  10:09 AM,07/03/23

## 2023-07-05 ENCOUNTER — Ambulatory Visit: Admitting: Nurse Practitioner

## 2023-07-09 ENCOUNTER — Ambulatory Visit

## 2023-07-09 DIAGNOSIS — M6281 Muscle weakness (generalized): Secondary | ICD-10-CM | POA: Diagnosis not present

## 2023-07-09 DIAGNOSIS — M25512 Pain in left shoulder: Secondary | ICD-10-CM | POA: Diagnosis not present

## 2023-07-09 DIAGNOSIS — G8929 Other chronic pain: Secondary | ICD-10-CM | POA: Diagnosis not present

## 2023-07-09 NOTE — Therapy (Signed)
 OUTPATIENT PHYSICAL THERAPY SHOULDER TREATMENT  Patient Name: [name] MRN: [id number] DOB:1955/09/11, 68 y.o., male Today's Date: 07/10/2023   PT End of Session - 07/09/23 1400     Visit Number 14    Number of Visits 25    Date for PT Re-Evaluation 08/20/23    Authorization Type eval: 02/26/23, [employer] Medicare, [place]:[employer] on [employer]    Authorization Time Period [employer] medicare 2024  [place]    PT Start Time 1405    PT Stop Time 1450    PT Time Calculation (min) 45 min    Activity Tolerance Patient tolerated treatment well    Behavior During Therapy WFL for tasks assessed/performed         Past Medical History:  Diagnosis Date   (HFpEF) heart failure with preserved ejection fraction (HCC)    a.) TTE 12/26/2013: EF 45-50%, mild ant and antsept HK. mild MR. Mod dil LA. nl RV fxn. Rhythm was Afib; b.) TTE 07/04/2019: EF 55%, mid-apical anteroseptal HK, mild MAC, mild Ao sclerosis, G2DD, RVSP 45.3; c.) TTE 06/02/2022: EF 55-60%, no RWMAs, sev LAE, RVSP 37.9, mild-mod MR, mild AR, AoV sclerosis without stenosis, asc Ao 38 mm   Anxiety    Aortic atherosclerosis (HCC)    Cardiomyopathy (in setting of Afib)    a.) TTE 12/26/2013: EF 45-50%; b.) TTE 07/04/2019: EF 55%; c.) TTE 06/02/2022: EF 55-60%   Chronic pain syndrome    a.) followed by pain management   Chronic, continuous use of opioids    a.) on COT; followed by pain management   Coronary artery disease 11/06/2014   a.) cCTA 11/06/2014: Ca2+ = 224 (74th %ile; LAD distribution)   Diverticulosis    DJD (degenerative joint disease) of knee    Former smoker    History of hiatal hernia    History of kidney stones 2012   Hyperlipidemia    Hyperplastic colon polyp    Hypertension    Inguinal hernia, left    Internal hemorrhoids    Intervertebral disc disorder with radiculopathy of lumbosacral region    Lesion of bone of lumbosacral spine    L5   Long term current use of amiodarone     Lower extremity edema    Mixed  hyperlipidemia    Morbid obesity (HCC)    Multiple falls    Myalgia due to statin    On rivaroxaban  therapy    Osteoarthritis    PAF (paroxysmal atrial fibrillation) (HCC)    a.) CHA2DS2VASc = 4 (age, HTN, CHF, vascular disease) as of 02/19/23;  b.) s/p DCCV 03/13/14 (200 J), 05/15/19 (150 J x 1, 200 J x2), 05/19/2022 (150 J x1, 200 J x2; pads changed to ant/lat postion with additional 200 J x 2 -> did not convert), 07/25/22 (200 J), 09/21/2022 (200 J); c.) s/p PVI ablation 11/17/2022; d.) rate/rhythm maintained on amiodarone ; chronically anticoagulated on rivaroxaban    Pernicious anemia    Prostate cancer (HCC)    Pulmonary nodule, right    a. 10/2014 Cardiac CTA: 7mm RLL nodule; b. 04/2015 CT Chest: stable 7mm RLL nodule. No new nodules; 02/2017 CTA Chest: stable, benign, 7mm RLL pulm nodule.   Rotator cuff tear, left    Sleep difficulties    a.) takes melatonin + trazodone  PRN   SVT (supraventricular tachycardia) (HCC)    Tubular adenoma of colon    Past Surgical History:  Procedure Laterality Date   ATRIAL FIBRILLATION ABLATION N/A 11/17/2022   Procedure: ATRIAL FIBRILLATION ABLATION; Location: [facility];  Surgeon: Gehi, Anil, MD   BICEPT TENODESIS Right 09/27/2021   Procedure: Right reverse shoulder arthroplasty, biceps tenodesis;  Surgeon: Tobie Priest, MD;  Location: [facility];  Service: Orthopedics;  Laterality: Right;   CARDIOVERSION N/A 05/15/2019   Procedure: CARDIOVERSION;  Surgeon: Perla Evalene PARAS, MD;  Location: [facility];  Service: Cardiovascular;  Laterality: N/A;   CARDIOVERSION N/A 03/13/2014   Procedure: CARDIOVERSION; Location: [facility]; Surgeon: Evalene Perla, MD   CARDIOVERSION N/A 03/24/2022   Procedure: CARDIOVERSION;  Surgeon: Perla Evalene PARAS, MD;  Location: [facility];  Service: Cardiovascular;  Laterality: N/A;   CARDIOVERSION N/A 05/19/2022   Procedure: CARDIOVERSION;  Surgeon: Perla Evalene PARAS, MD;  Location: [facility];  Service: Cardiovascular;  Laterality: N/A;    CARDIOVERSION N/A 07/25/2022   Procedure: CARDIOVERSION;  Surgeon: Fernande Elspeth BROCKS, MD;  Location: [facility];  Service: Cardiovascular;  Laterality: N/A;   CARDIOVERSION N/A 09/21/2022   Procedure: CARDIOVERSION;  Surgeon: Fernande Elspeth BROCKS, MD;  Location: [facility];  Service: Cardiovascular;  Laterality: N/A;   CARPAL TUNNEL RELEASE Right 12/23/2014   Procedure: CARPAL TUNNEL RELEASE;  Surgeon: Kayla Pinal, MD;  Location: [facility];  Service: Orthopedics;  Laterality: Right;   CARPAL TUNNEL RELEASE Left 01/06/2015   Procedure: CARPAL TUNNEL RELEASE;  Surgeon: Kayla Pinal, MD;  Location: [facility];  Service: Orthopedics;  Laterality: Left;   COLONOSCOPY WITH PROPOFOL  N/A 10/08/2017   Procedure: COLONOSCOPY WITH PROPOFOL ;  Surgeon: Unk Corinn Skiff, MD;  Location[facility];  Service: Gastroenterology;  Laterality: N/A;   COLONOSCOPY WITH PROPOFOL  N/A 06/07/2022   Procedure: COLONOSCOPY WITH PROPOFOL ;  Surgeon: Unk Corinn Skiff, MD;  Location: [facility]OSCOPY;  Service: Gastroenterology;  Laterality: N/A;   ENTEROSCOPY  06/07/2022   Procedure: ENTEROSCOPY;  Surgeon: Unk Corinn Skiff, MD;  Location: [facility] ENDOSCOPY;  Service: Gastroenterology;;   ESOPHAGOGASTRODUODENOSCOPY (EGD) WITH PROPOFOL  N/A 06/07/2022   Procedure: ESOPHAGOGASTRODUODENOSCOPY (EGD) WITH PROPOFOL ;  Surgeon: Unk Corinn Skiff, MD;  Location: [facility];  Service: Gastroenterology;  Laterality: N/A;   GASTRIC BYPASS  01/17/2000   KNEE SURGERY Right    knee trauma x3   prostate seeding     REVERSE SHOULDER ARTHROPLASTY Right 09/27/2021   Procedure: Right reverse shoulder arthroplasty, biceps tenodesis;  Surgeon: Tobie Priest, MD;  Location: [facility];  Service: Orthopedics;  Laterality: Right;   REVERSE SHOULDER ARTHROPLASTY Left 02/20/2023   Procedure: Left reverse shoulder arthroplasty, biceps tenodesis;  Surgeon: Tobie Priest, MD;  Location: [facility];  Service: Orthopedics;  Laterality: Left;   SHOULDER  ARTHROSCOPY WITH ROTATOR CUFF REPAIR AND OPEN BICEPS TENODESIS Left 12/27/2021   Procedure: Left shoulder arthroscopic cuff repair (supraspinatus and subscapularis) with Regeneten Patch application;  Surgeon: Tobie Priest, MD;  Location: [facility];  Service: Orthopedics;  Laterality: Left;   Patient Active Problem List   Diagnosis Date Noted   Symptomatic anemia 04/26/2022   Chronic pain of both knees 03/30/2022   Bilateral primary osteoarthritis of knee 03/30/2022   Shortness of breath 03/24/2022   Vitamin D  deficiency 02/24/2022   S/p reverse total shoulder arthroplasty 09/27/2021   Lesion of bone of lumbosacral spine (L5) 05/12/2021   Localized osteoarthritis of shoulder regions, bilateral 03/29/2021   Chronic pain of both shoulders 03/29/2021   Drug-induced myopathy 02/21/2021   Trigger finger of right hand 11/15/2020   Prostate cancer (HCC) 07/26/2020   Insomnia 08/01/2019   Primary osteoarthritis of both wrists 02/17/2019   Trigger middle finger of left hand 02/17/2019   Chronic pain syndrome 02/17/2019   Chronic  radicular lumbar pain 10/08/2018   Lumbar radiculopathy 10/08/2018   Lumbar degenerative disc disease 10/08/2018   Lumbar facet arthropathy 10/08/2018   Lumbar facet joint syndrome 10/08/2018   Intervertebral disc disorder with radiculopathy of lumbosacral region    Osteoarthritis of knee 11/30/2017   Osteoarthritis of wrist 11/30/2017   Pernicious anemia 05/22/2016   Persistent atrial fibrillation (HCC) 12/20/2015   Carpal tunnel syndrome 11/16/2014   DJD (degenerative joint disease) of knee 10/27/2014   Bilateral carpal tunnel syndrome 10/27/2014   Morbid obesity with BMI of 45.0-49.9, adult (HCC) 10/27/2014   Mixed hyperlipidemia 10/27/2014   H/O gastric bypass 03/20/2014   Hyperkalemia 03/20/2014   History of prostate cancer 12/26/2013   Essential hypertension 12/26/2013   Encounter for anticoagulation discussion and counseling 12/26/2013   PCP: Dr.  Edman  REFERRING PROVIDER: Dr. Earnestine Blanch  REFERRING DIAG: [id number] (ICD-10-CM) - Complete rotator cuff tear or rupture of left shoulder, not specified as traumatic   RATIONALE FOR EVALUATION AND TREATMENT: Rehabilitation  THERAPY DIAG: Chronic left shoulder pain  Muscle weakness (generalized)  ONSET DATE: 02/20/23  FOLLOW-UP APPT SCHEDULED WITH REFERRING PROVIDER: Yes   FROM INITIAL EVALUATION SUBJECTIVE:                                                                                                                                                                                         SUBJECTIVE STATEMENT:  L reverse TSR 02/20/23  PERTINENT HISTORY:  KELVEN FLATER is a 68 y.o. male who initially underwent arthroscopic subscapularis repair and side-to-side supraspinatus repair with Regeneten patch application by Dr. Blanch 12/27/2021. He underwent post-surgical physical therapy with initial minimal improvement followed by worsening shoulder strength/function. Repeat MRI showed large, retracted subscapularis re-tear and supraspinatus re-tear with signs of rotator cuff arthropathy. Conservative measures including medications, physical therapy, and cortisone injections did not provide adequate relief. The patient elected to proceed with reverse shoulder arthroplasty and biceps tenodesis on 02/20/23. Intraoperative findings include retracted full-thickness subscapularis tear, partial-thickness supraspinatus tear, and biceps tendinopathy. Surgery was a standard deltopectoral incision under general anesthesia along with an interscalene block. Per op note, operative arm to remain in sling at all times except ROM exercises and hygiene. Can perform pendulums, elbow/wrist/hand ROM exercises. Passive ROM allowed to 90 FF and 30 ER. ASA 325mg  x 6 weeks for DVT ppx (pt states that he was actually instructed to start back on his Xarelto  and not to use ASA). Plan for PT starting on POD #3-4. Pt denies any  post-operative complications from surgery. Pt has a history of chronic back pain and has undergone multiple lumbar facet, medial branch radiofrequency ablations. He also  has a past history of cardiomyopathy (in setting of Afib), DJD (degenerative joint disease) of knee, kidney stones, lower extremity edema, PAF (paroxysmal atrial fibrillation), pernicious anemia, prostate cancer, and a pulmonary nodule. He also has a past surgical history that includes R reverse TSR, knee surgery, gastric bypass (2002), prostate seeding, carpal tunnel release (Right, 12/23/2014; Left, 01/06/2015), and multiple cardioversions.   PAIN:  Pain Intensity: No resting pain upon arrival Pain location: L shoulder Pain Quality: Nerve pain Radiating: No  Numbness/Tingling: No Focal Weakness: Yes 24-hour pain behavior: Varies History of prior shoulder or neck/shoulder injury, pain, surgery, or therapy: Yes PT for L shoulder s/p RTC repair, history of R reverse TSR; Dominant hand: right Red flags Positive: history of prostate CA, Negative: chills/fever, night sweats, nausea, vomiting, unrelenting pain, unexplained weight gain/loss  PRECAUTIONS: Shoulder, per operative note: operative arm to remain in sling at all times except ROM exercises and hygiene. Can perform pendulums, elbow/wrist/hand ROM exercises. Passive ROM allowed to 90 FF and 30 ER.   General reverse TSR precautions include no combined shoulder extension, IR, and adduction. No shoulder and elbow AROM (bicep tenodesis) for at least the first 4 weeks   WEIGHT BEARING RESTRICTIONS: Yes no WB through LUE  FALLS: Has patient fallen in last 6 months? 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  Recent memory is intact.  Remote memory is intact.  Attention span and concentration are intact.  Expressive speech is intact.  Patient's fund of knowledge is within normal limits for educational level.    Gross Musculoskeletal Assessment Tremor: None Bulk: Normal Tone: Normal Incision is clean and dry with minimal blood staining on honeycomb dressing as well as minimal swelling around the incision. Skin is cool to touch and no signs of infection present.   Gait Deferred full gait assessment  Posture Forward head  with upper thoracic kyphosis and rounded shoulders  Cervical Screen Deferred  ROM ROM (Normal range in degrees)    Right (AROM) Left (PROM)  Shoulder    Flexion 160 90 (limited per protocol)  Extension    Abduction 180 90 (scaption)  External Rotation 88 0  Internal Rotation 70 stomach  Hands Behind Head    Hands Behind Back        Elbow    Flexion Full Full  Extension 0 0  Pronation WNL Full palm up  Supination WNL Full palm down  (* = pain; Blank rows = not tested)  UE MMT: MMT (out of 5) Right Left   Shoulder   Flexion 5   Extension    Abduction 5   External rotation 5   Internal rotation 5   Horizontal abduction    Horizontal adduction    Lower Trapezius    Rhomboids        Elbow  Flexion 5   Extension 5   Pronation 5   Supination 5       Wrist  Flexion 5 5  Extension 5 5  Radial deviation  5  Ulnar deviation  5      MCP  Flexion 5 5  Extension 5 5  Abduction 5 5  Adduction 5 5  (* = pain; Blank rows = not  tested)  Sensation Deferred  Reflexes Deferred  Palpation No tenderness to light palpation around entire L shoulder girdle;  Passive Accessory Intervertebral Motion Deferred  Accessory Motions/Glides Deferred  Muscle Length Testing Deferred  SPECIAL TESTS Deferred  Beighton scale Deferred    TODAY'S TREATMENT: 07/09/23   Subjective: Patient reports ongoing constant L shoulder pain. No additional episodes of L shoulder instability/subluxation. Slight improvement in energy following last transfusion. He has an appointment to follow-up with ortho in August but is wondering if he should contact earlier.   PAIN: Burning/ache    Ther-ex  UBE x 4 minutes (2 minutes forward/2 minutes backward) at start of session during interval history (2 minutes unbilled); In semi-recumbent position: L shoulder flexion with 4# dumbbell 2 x 10; L shoulder scaption with 4# DB 2 x 5; L shoulder overhead press with 4# DB 2 x 10; L shoulder manually resisted ER 2 x 10; L shoulder manually resisted IR 2 x 10; L elbow manually resisted flexion/extension 2 x 10 each; Dowel chest press with manual resistance from therapist 2 x 10; Seated dowel rows with manual resistance from therapist 2 x 10; Nautilus lat pull downs with hand grips 95# 3 x 10; Nautilus rows with hand grips 50# 3 x 10; Ice pack applied to L shoulder at end of session (unbilled);   HOME EXERCISE PROGRAM:  Access Code: [email or web address] URL: [email or web address] Date: 02/28/2023 Prepared by: Selinda Eck  Exercises - Seated Cervical Sidebending Stretch (Mirrored)  - 2 x daily - 7 x weekly - 3 reps - 30s hold - Seated Scapular Retraction  - 2 x daily - 7 x weekly - 2 sets - 10 reps - 3s hold - Circular Shoulder Pendulum with Table Support  - 2 x daily - 7 x weekly - 3 reps - 60s hold - Horizontal Shoulder Pendulum with Table Support  - 2 x daily - 7 x weekly - 3 reps - 60s hold - Flexion-Extension Shoulder  Pendulum with Table Support (Mirrored)  - 2 x daily - 7 x weekly - 3 reps - 60s hold - Wrist Flexion AROM  - 2 x daily - 7 x weekly - 2 sets - 10 reps - 3s hold - Wrist Extension AROM  - 1 x daily - 7 x weekly - 2 sets - 10 reps - 3s hold    ASSESSMENT: Pt reports ongoing weakness with some pain today during L shoulder abduction with 4# DB. He is only able to complete 2 sets of 5 reps today. He continues with most notable weakness in L shoulder scaption/abduction and ER. Continued strengthening on Nautilus machine. Pt encouraged to continue HEP and follow up as directed. He will continue to benefit from skilled therapy to address remaining deficits in order to improve quality of life and return to PLOF.     OBJECTIVE IMPAIRMENTS: decreased ROM, decreased strength, and pain.   ACTIVITY LIMITATIONS: carrying, lifting, bathing, toileting, dressing, and reach over head  PARTICIPATION LIMITATIONS: meal prep, cleaning, laundry, driving, shopping, community activity, and yard work  PERSONAL FACTORS: Past/current experiences, Time since onset of injury/illness/exacerbation, and 3+ comorbidities: OA, anemia, anxiety, cardiomyopathy, chronic pain are also affecting patient's functional outcome.   REHAB POTENTIAL: Good  CLINICAL DECISION MAKING: Unstable/unpredictable  EVALUATION COMPLEXITY: High   GOALS: Goals reviewed with patient? Yes  SHORT TERM GOALS: Target date: 07/09/2023   Pt will be independent with HEP to improve strength and decrease shoulder pain to improve pain-free function at home and work. Baseline: Issued and progressing Goal status: ONGOING   LONG TERM GOALS: Target date: 08/20/2023  1.  Pt will report no further L shoulder pain with all AROM in order to complete all ADLs and improve pain-free function. Baseline: 05/28/23: worst pain 7/10; Goal status: ONGOING  2.  Pt will decrease QuickDASH score to below 30% in order to demonstrate clinically significant reduction in  disability related to shoulder pain        Baseline: 02/26/23: 72.7%; 05/28/23: 40.9% Goal status: REVISED  3. Pt will increase strength of L shoulder flexion and scaption to at least 4/5 in order to demonstrate improvement in strength and function         Baseline: 02/26/23: Unable to test; 05/28/23: flexion: 4+/5, pain free, scaption: 4/5, pain Goal status: ACHIEVED  4. Pt will improve L shoulder AROM flexion and scaption to at least 110 degrees in order to demonstrate improvement in function so he can take care of his property and complete all household responsibilities;        Baseline: 02/26/23: Unable to test (90 degrees PROM for both), 05/28/23: L shoulder AROM against gravity: flexion: 176, no pain, scaption: 130, pain/burning Goal status: ACHIEVED   PLAN: PT FREQUENCY: 1-2x/week  PT DURATION: 12 weeks  PLANNED INTERVENTIONS: Therapeutic exercises, Therapeutic activity, Neuromuscular re-education, Balance training, Gait training, Patient/Family education, Self Care, Joint mobilization, Joint manipulation, Vestibular training, Canalith repositioning, Orthotic/Fit training, DME instructions, Dry Needling, Electrical stimulation, Spinal manipulation, Spinal mobilization, Cryotherapy, Moist heat, Taping, Traction, Ultrasound, Ionotophoresis 4mg /ml Dexamethasone , Manual therapy, and Re-evaluation.  PLAN FOR NEXT SESSION: progress HEP, progress L shoulder ROM and strength per surgical protocol;  Selinda BIRCH Junelle Hashemi PT, DPT, GCS  11:08 AM,07/10/23

## 2023-07-11 ENCOUNTER — Encounter: Payer: Self-pay | Admitting: Urology

## 2023-07-11 ENCOUNTER — Ambulatory Visit: Admitting: Urology

## 2023-07-11 VITALS — BP 183/119 | HR 66 | Ht 73.0 in | Wt 346.0 lb

## 2023-07-11 DIAGNOSIS — R310 Gross hematuria: Secondary | ICD-10-CM

## 2023-07-11 DIAGNOSIS — Z87440 Personal history of urinary (tract) infections: Secondary | ICD-10-CM | POA: Diagnosis not present

## 2023-07-11 DIAGNOSIS — Z8546 Personal history of malignant neoplasm of prostate: Secondary | ICD-10-CM | POA: Diagnosis not present

## 2023-07-11 LAB — MICROSCOPIC EXAMINATION

## 2023-07-11 LAB — URINALYSIS, COMPLETE
Bilirubin, UA: NEGATIVE
Glucose, UA: NEGATIVE
Leukocytes,UA: NEGATIVE
Nitrite, UA: NEGATIVE
Protein,UA: NEGATIVE
RBC, UA: NEGATIVE
Specific Gravity, UA: 1.03 (ref 1.005–1.030)
Urobilinogen, Ur: 0.2 mg/dL (ref 0.2–1.0)
pH, UA: 5.5 (ref 5.0–7.5)

## 2023-07-11 NOTE — Progress Notes (Signed)
 I, [name], acting as a scribe for [place] JAYSON Barba, MD., have documented all relevant documentation on the behalf of [place] JAYSON Barba, MD, as directed by [place] JAYSON Barba, MD while in the presence of [place] JAYSON Barba, MD.  07/11/2023 3:03 PM   [name]  Referring provider: Edman Marsa JINNY, DO [address] [place],  [name]  Chief Complaint  Patient presents with   Cystitis    HPI: [name] is a 68 y.o. male referred for evaluation of UTIs/hematuria.   PCP visit 05/08/2023 with 1 week history of urinary hesitancy, decreased force and caliber of a urinary stream, straining at the end of urination, and the urine pink-reddish in color. Had a low grade fever and a low back pain. Urinalysis dipstick showed large 3+ blood,and was nitrite-positive with trace leukocytes; urine culture grew E. coli. He was treated with a 1 week course of Cefalexin 500 mg twice daily with improvement in his symptoms, however after he finished the antibiotics, his symptoms returned. He was subsequently treated with an extended course of Septra -DS with a resolution of his symptoms.  CT urogram was performed 05/15/23 which showed no signs of any GU abnormalities. There was a punctate 1 mm non-obstructing left lower pole calculus. There was a 13 mm Bosniak II cyst. He presently is without complaints and voiding without problems.  History of prostate cancer, treated by Dr. Kassie with in-office brachytherapy around 2014.  Last PSA June 2024 was undetectable at <0.01. He is scheduled for annual blood work with Dr [place] later this month.   PMH: Past Medical History:  Diagnosis Date   (HFpEF) heart failure with preserved ejection fraction (HCC)    a.) TTE 12/26/2013: EF 45-50%, mild ant and antsept HK. mild MR. Mod dil LA. nl RV fxn. Rhythm was Afib; b.) TTE 07/04/2019: EF 55%, mid-apical anteroseptal HK, mild MAC, mild Ao sclerosis, G2DD, RVSP 45.3; c.) TTE 06/02/2022:  EF 55-60%, no RWMAs, sev LAE, RVSP 37.9, mild-mod MR, mild AR, AoV sclerosis without stenosis, asc Ao 38 mm   Anxiety    Aortic atherosclerosis (HCC)    Cardiomyopathy (in setting of Afib)    a.) TTE 12/26/2013: EF 45-50%; b.) TTE 07/04/2019: EF 55%; c.) TTE 06/02/2022: EF 55-60%   Chronic pain syndrome    a.) followed by pain management   Chronic, continuous use of opioids    a.) on COT; followed by pain management   Coronary artery disease 11/06/2014   a.) cCTA 11/06/2014: Ca2+ = 224 (74th %ile; LAD distribution)   Diverticulosis    DJD (degenerative joint disease) of knee    Former smoker    History of hiatal hernia    History of kidney stones 2012   Hyperlipidemia    Hyperplastic colon polyp    Hypertension    Inguinal hernia, left    Internal hemorrhoids    Intervertebral disc disorder with radiculopathy of lumbosacral region    Lesion of bone of lumbosacral spine    L5   Long term current use of amiodarone     Lower extremity edema    Mixed hyperlipidemia    Morbid obesity (HCC)    Multiple falls    Myalgia due to statin    On rivaroxaban  therapy    Osteoarthritis    PAF (paroxysmal atrial fibrillation) (HCC)    a.) CHA2DS2VASc = 4 (age, HTN, CHF, vascular disease) as of 02/19/23;  b.) s/p DCCV 03/13/14 (200 J),  05/15/19 (150 J x 1, 200 J x2), 05/19/2022 (150 J x1, 200 J x2; pads changed to ant/lat postion with additional 200 J x 2 -> did not convert), 07/25/22 (200 J), 09/21/2022 (200 J); c.) s/p PVI ablation 11/17/2022; d.) rate/rhythm maintained on amiodarone ; chronically anticoagulated on rivaroxaban    Pernicious anemia    Prostate cancer (HCC)    Pulmonary nodule, right    a. 10/2014 Cardiac CTA: 7mm RLL nodule; b. 04/2015 CT Chest: stable 7mm RLL nodule. No new nodules; 02/2017 CTA Chest: stable, benign, 7mm RLL pulm nodule.   Rotator cuff tear, left    Sleep difficulties    a.) takes melatonin + trazodone  PRN   SVT (supraventricular tachycardia) (HCC)    Tubular  adenoma of colon     Surgical History: Past Surgical History:  Procedure Laterality Date   ATRIAL FIBRILLATION ABLATION N/A 11/17/2022   Procedure: ATRIAL FIBRILLATION ABLATION; Location: [facility]; Surgeon: Gehi, Anil, MD   BICEPT TENODESIS Right 09/27/2021   Procedure: Right reverse shoulder arthroplasty, biceps tenodesis;  Surgeon: Tobie Priest, MD;  Location: [facility];  Service: Orthopedics;  Laterality: Right;   CARDIOVERSION N/A 05/15/2019   Procedure: CARDIOVERSION;  Surgeon: Perla Evalene PARAS, MD;  Location: [facility];  Service: Cardiovascular;  Laterality: N/A;   CARDIOVERSION N/A 03/13/2014   Procedure: CARDIOVERSION; Location: [facility]; Surgeon: Evalene Perla, MD   CARDIOVERSION N/A 03/24/2022   Procedure: CARDIOVERSION;  Surgeon: Perla Evalene PARAS, MD;  Location: [facility];  Service: Cardiovascular;  Laterality: N/A;   CARDIOVERSION N/A 05/19/2022   Procedure: CARDIOVERSION;  Surgeon: Perla Evalene PARAS, MD;  Location: [facility];  Service: Cardiovascular;  Laterality: N/A;   CARDIOVERSION N/A 07/25/2022   Procedure: CARDIOVERSION;  Surgeon: Fernande Elspeth BROCKS, MD;  Location: [facility];  Service: Cardiovascular;  Laterality: N/A;   CARDIOVERSION N/A 09/21/2022   Procedure: CARDIOVERSION;  Surgeon: Fernande Elspeth BROCKS, MD;  Location: [facility];  Service: Cardiovascular;  Laterality: N/A;   CARPAL TUNNEL RELEASE Right 12/23/2014   Procedure: CARPAL TUNNEL RELEASE;  Surgeon: Kayla Pinal, MD;  Location: [facility];  Service: Orthopedics;  Laterality: Right;   CARPAL TUNNEL RELEASE Left 01/06/2015   Procedure: CARPAL TUNNEL RELEASE;  Surgeon: Kayla Pinal, MD;  Location: [facility];  Service: Orthopedics;  Laterality: Left;   COLONOSCOPY WITH PROPOFOL  N/A 10/08/2017   Procedure: COLONOSCOPY WITH PROPOFOL ;  Surgeon: Unk Corinn Skiff, MD;  Location[facility];  Service: Gastroenterology;  Laterality: N/A;   COLONOSCOPY WITH PROPOFOL  N/A 06/07/2022   Procedure: COLONOSCOPY WITH PROPOFOL ;  Surgeon:  Unk Corinn Skiff, MD;  Location: [facility];  Service: Gastroenterology;  Laterality: N/A;   ENTEROSCOPY  06/07/2022   Procedure: ENTEROSCOPY;  Surgeon: Unk Corinn Skiff, MD;  Location: [facility];  Service: Gastroenterology;;   ESOPHAGOGASTRODUODENOSCOPY (EGD) WITH PROPOFOL  N/A 06/07/2022   Procedure: ESOPHAGOGASTRODUODENOSCOPY (EGD) WITH PROPOFOL ;  Surgeon: Unk Corinn Skiff, MD;  Location: [facility];  Service: Gastroenterology;  Laterality: N/A;   GASTRIC BYPASS  01/17/2000   KNEE SURGERY Right    knee trauma x3   prostate seeding     REVERSE SHOULDER ARTHROPLASTY Right 09/27/2021   Procedure: Right reverse shoulder arthroplasty, biceps tenodesis;  Surgeon: Tobie Priest, MD;  Location: [facility];  Service: Orthopedics;  Laterality: Right;   REVERSE SHOULDER ARTHROPLASTY Left 02/20/2023   Procedure: Left reverse shoulder arthroplasty, biceps tenodesis;  Surgeon: Tobie Priest, MD;  Location: [facility];  Service: Orthopedics;  Laterality: Left;   SHOULDER ARTHROSCOPY WITH ROTATOR CUFF REPAIR AND OPEN BICEPS TENODESIS Left 12/27/2021  Procedure: Left shoulder arthroscopic cuff repair (supraspinatus and subscapularis) with Regeneten Patch application;  Surgeon: Tobie Priest, MD;  Location: [facility];  Service: Orthopedics;  Laterality: Left;    Home Medications:  Allergies as of 07/11/2023       Reactions   Bupivacaine  Liposome Palpitations   Pt was given this for shoulder surgery in 2023 and states heart was pounding        Medication List        Accurate as of July 11, 2023  3:03 PM. If you have any questions, ask your nurse or doctor.          amLODipine  5 MG tablet Commonly known as: NORVASC  Take 1 tablet (5 mg total) by mouth daily.   benazepril  40 MG tablet Commonly known as: LOTENSIN  TAKE ONE TABLET BY MOUTH ONE TIME DAILY What changed:  when to take this additional instructions   busPIRone  10 MG tablet Commonly known as: BUSPAR  TAKE ONE TABLET  BY MOUTH FOUR TIMES A DAY AS NEEDED FOR ANIXETY   carboxymethylcellulose 0.5 % Soln Commonly known as: REFRESH PLUS Place 1-2 drops into both eyes 3 (three) times daily as needed (dry/irritated eyes.).   CITRACAL CALCIUM  +D3 PO   cyanocobalamin  1000 MCG/ML injection Commonly known as: VITAMIN B12 INJECT 1ML INTO THE SKIN EVERY 30 DAYS AS DIRECTED What changed: See the new instructions.   cyclobenzaprine  10 MG tablet Commonly known as: FLEXERIL  Take 1 tablet (10 mg total) by mouth at bedtime.   DULoxetine  60 MG capsule Commonly known as: CYMBALTA  TAKE ONE CAPSULE BY MOUTH ONE TIME DAILY   FERROUS BISGLYCINATE CHELATE PO Take 25 mg by mouth daily. Bronson Iron  Bisglycinate 25 mg Gentle on The Stomach Supports Energy & Healthy Red Blood Cell Production   fluticasone  50 MCG/ACT nasal spray Commonly known as: FLONASE  USE TWO SPRAYS IN EACH NOSTRIL ONE TIME DAILY   HYDROcodone -acetaminophen  7.5-325 MG tablet Commonly known as: NORCO Take 1 tablet by mouth every 6 (six) hours as needed for moderate pain (pain score 4-6).   HYDROcodone -acetaminophen  7.5-325 MG tablet Commonly known as: NORCO Take 1 tablet by mouth every 6 (six) hours as needed for moderate pain (pain score 4-6). Start taking on: July 20, 2023   melatonin 3 MG Tabs tablet Take 3 mg by mouth at bedtime.   multivitamin with minerals Tabs tablet Take 1 tablet by mouth in the morning.   traZODone  150 MG tablet Commonly known as: DESYREL  TAKE ONE TABLET BY MOUTH AT BEDTIME   Xarelto  20 MG Tabs tablet Generic drug: rivaroxaban  TAKE ONE TABLET BY MOUTH ONE TIME DAILY WITH SUPPER        Allergies:  Allergies  Allergen Reactions   Bupivacaine  Liposome Palpitations    Pt was given this for shoulder surgery in 2023 and states heart was pounding    Family History: Family History  Problem Relation Age of Onset   Heart disease Father    Hypertension Father    Stroke Father    Cancer Father        prostate  cancer   Cancer Sister    Arrhythmia Sister        A-fib   Arrhythmia Brother        A-fib   Cancer Brother     Social History:  reports that he quit smoking about 44 years ago. His smoking use included cigarettes. He started smoking about 54 years ago. He has a 10 pack-year smoking history. He has quit using smokeless  tobacco.  His smokeless tobacco use included snuff. He reports that he does not currently use alcohol . He reports that he does not use drugs.   Physical Exam: BP (!) 183/119   Pulse 66   Ht 6' 1 (1.854 m)   Wt (!) 346 lb (156.9 kg)   BMI 45.65 kg/m   Constitutional:  Alert and oriented, No acute distress. HEENT: [place] AT Respiratory: Normal respiratory effort, no increased work of breathing. Psychiatric: Normal mood and affect.   Urinalysis Dipstick traced ketones, microscopy negative    Pertinent Imaging: CT was personally reviewed and interpreted.   CT HEMATURIA WORKUP  Narrative CLINICAL DATA:  Gross hematuria.  History of prostate cancer.  EXAM: CT ABDOMEN AND PELVIS WITHOUT AND WITH CONTRAST  TECHNIQUE: Multidetector CT imaging of the abdomen and pelvis was performed following the standard protocol before and following the bolus administration of intravenous contrast.  RADIATION DOSE REDUCTION: This exam was performed according to the departmental dose-optimization program which includes automated exposure control, adjustment of the mA and/or kV according to patient size and/or use of iterative reconstruction technique.  CONTRAST:  OMNIPAQUE  IOHEXOL  300 MG/ML  SOLN  COMPARISON:  Abdomen and pelvis CT 02/20/2022  FINDINGS: Lower chest: 7 mm right lower lobe pulmonary nodule unchanged since prior consistent with benign etiology. No suspicious pulmonary nodule or mass at the left lung base.  Hepatobiliary: No suspicious focal abnormality within the liver parenchyma. Multiple calcified gallstones evident. No gallbladder wall thickening  or pericholecystic fluid. No intrahepatic or extrahepatic biliary dilation.  Pancreas: No focal mass lesion. No dilatation of the main duct. No intraparenchymal cyst. No peripancreatic edema.  Spleen: No splenomegaly. No suspicious focal mass lesion.  Adrenals/Urinary Tract: No adrenal nodule or mass.  Precontrast imaging shows no stones in the right kidney. Punctate 1 mm nonobstructing stone identified lower pole left kidney no ureteral or bladder stones.  Imaging after IV contrast administration shows no overtly suspicious enhancing mass lesion in either kidney. 13 mm exophytic lesion upper pole left kidney has attenuation too high to be a simple cyst on precontrast imaging but shows no enhancement after IV contrast administration compatible with a Bosniak II cyst.  Delayed post-contrast imaging shows no wall thickening or soft tissue filling defect in either intrarenal collecting system or renal pelvis. Neither ureter completely opacified on delayed imaging, but there is no focal hydroureter, ureteral wall thickening, or evidence of soft tissue mass along the course of either ureter. Delayed imaging of the bladder shows no focal wall thickening or mass lesion.  Stomach/Bowel: Sequelae of gastric bypass noted in the stomach. No small bowel wall thickening. The soft tissue fullness in the region of the distal anastomosis previously is not evident today. No small bowel dilatation. The terminal ileum is normal. The appendix is not well visualized, but there is no edema or inflammation in the region of the cecal tip to suggest appendicitis. No gross colonic mass. No colonic wall thickening. Diverticular changes are noted in the left colon without evidence of diverticulitis.  Vascular/Lymphatic: There is moderate atherosclerotic calcification of the abdominal aorta without aneurysm. There is no gastrohepatic or hepatoduodenal ligament lymphadenopathy. No retroperitoneal  or mesenteric lymphadenopathy. No pelvic sidewall lymphadenopathy.  Reproductive: Brachytherapy seeds are seen in the prostate gland.  Other: No intraperitoneal free fluid. Irregular soft tissue density is seen in the small bowel mesentery of the central abdomen, in close proximity to the distal anastomosis. This is been stable since the prior study and also comparing back  to an exam from 03/13/2017 suggesting scarring from the gastric bypass procedure in small bowel anastomosis.  Musculoskeletal: Small left groin hernia contains only fat. Sclerotic lesion in the left iliac bone stable since 2019 consistent with bone island. Small sclerotic foci in the sacrum are minimally increased since 2019 and a 8 mm sclerotic lesion in the right sacral ala is new since 2019 but stable since the 2024 exam. Bilateral pars interarticularis defects noted at L5.  IMPRESSION: 1. Punctate 1 mm nonobstructing stone lower pole left kidney. No ureteral or bladder stones. 2. No overtly suspicious enhancing mass lesion in either kidney. 13 mm exophytic lesion upper pole left kidney has attenuation too high to be a simple cyst on precontrast imaging but shows no enhancement after IV contrast administration compatible with a Bosniak II cyst. 3. Cholelithiasis. 4. Left colonic diverticulosis without diverticulitis. 5. Small left groin hernia contains only fat. 6. Small sclerotic foci in the sacrum are minimally increased since 2019 and a 8 mm sclerotic lesion in the right sacral ala is new since 2019 but stable since the 2024 exam. Given history of prostate cancer, continued attention on follow-up recommended. 7.  Aortic Atherosclerosis (ICD10-I70.0).   Electronically Signed By: Camellia Candle M.D. On: 05/15/2023 11:49  No results found for this or any previous visit.   Assessment & Plan:    1. History of urinary tract infection Positive culture E. coli, He did have systemic symptoms and recurred  after 7 days of antibiotics, which is suspicious for acute prostatitis, though less common with prior brachytherapy.  CT urogram showed no upper tract abnormalities.  UA today clear and he is asymptomatic.  Cystoscopy for recurrent UTI or hematuria.   2. History of prostate cancer Last PSA 1 year ago was undetectable and he is scheduled for a PSA in the near future.   I offered him annual versus PRN follow-up and he elected the latter.  I have reviewed the above documentation for accuracy and completeness, and I agree with the above.   [place] JAYSON Barba, MD  [facility] [address] [place], [place] [address] [phone]

## 2023-07-12 ENCOUNTER — Encounter: Payer: Self-pay | Admitting: Urology

## 2023-07-15 ENCOUNTER — Other Ambulatory Visit: Payer: Self-pay | Admitting: Cardiovascular Disease

## 2023-07-15 ENCOUNTER — Other Ambulatory Visit: Payer: Self-pay | Admitting: Family Medicine

## 2023-07-15 DIAGNOSIS — G4700 Insomnia, unspecified: Secondary | ICD-10-CM

## 2023-07-15 DIAGNOSIS — I10 Essential (primary) hypertension: Secondary | ICD-10-CM

## 2023-07-16 ENCOUNTER — Encounter

## 2023-07-16 NOTE — Telephone Encounter (Signed)
Please contact pt for future appointment. Pt overdue for follow up.

## 2023-07-17 NOTE — Telephone Encounter (Signed)
 Patient was just seen by Riddle on 5/16 and isnt due for appt with Gollan until 10/25

## 2023-07-17 NOTE — Telephone Encounter (Signed)
 Requested Prescriptions  Pending Prescriptions Disp Refills   traZODone  (DESYREL ) 150 MG tablet [Pharmacy Med Name: TRAZODONE  150 MG TAB[*]] 90 tablet 0    Sig: TAKE ONE TABLET BY MOUTH AT BEDTIME     Psychiatry: Antidepressants - Serotonin Modulator Failed - 07/17/2023  2:01 PM      Failed - Valid encounter within last 6 months    Recent Outpatient Visits           2 months ago Acute cystitis with hematuria   [facility] Edman Marsa PARAS, DO       Future Appointments             In 2 weeks Edman, Marsa PARAS, DO [place] [facility], [facility]

## 2023-07-19 ENCOUNTER — Ambulatory Visit: Admitting: Internal Medicine

## 2023-07-19 ENCOUNTER — Ambulatory Visit: Admitting: Cardiology

## 2023-07-23 ENCOUNTER — Other Ambulatory Visit: Payer: Self-pay

## 2023-07-23 DIAGNOSIS — N401 Enlarged prostate with lower urinary tract symptoms: Secondary | ICD-10-CM

## 2023-07-23 DIAGNOSIS — I10 Essential (primary) hypertension: Secondary | ICD-10-CM

## 2023-07-23 DIAGNOSIS — E782 Mixed hyperlipidemia: Secondary | ICD-10-CM

## 2023-07-23 DIAGNOSIS — D510 Vitamin B12 deficiency anemia due to intrinsic factor deficiency: Secondary | ICD-10-CM

## 2023-07-23 DIAGNOSIS — D649 Anemia, unspecified: Secondary | ICD-10-CM

## 2023-07-23 DIAGNOSIS — Z0000 Encounter for general adult medical examination without abnormal findings: Secondary | ICD-10-CM

## 2023-07-23 DIAGNOSIS — R7309 Other abnormal glucose: Secondary | ICD-10-CM

## 2023-07-26 ENCOUNTER — Ambulatory Visit

## 2023-07-27 ENCOUNTER — Inpatient Hospital Stay: Attending: Internal Medicine

## 2023-07-27 VITALS — BP 119/72 | HR 96 | Temp 97.1°F | Resp 18

## 2023-07-27 DIAGNOSIS — Z9884 Bariatric surgery status: Secondary | ICD-10-CM | POA: Diagnosis not present

## 2023-07-27 DIAGNOSIS — E538 Deficiency of other specified B group vitamins: Secondary | ICD-10-CM | POA: Insufficient documentation

## 2023-07-27 DIAGNOSIS — I4891 Unspecified atrial fibrillation: Secondary | ICD-10-CM | POA: Diagnosis not present

## 2023-07-27 DIAGNOSIS — D649 Anemia, unspecified: Secondary | ICD-10-CM | POA: Insufficient documentation

## 2023-07-27 DIAGNOSIS — E669 Obesity, unspecified: Secondary | ICD-10-CM | POA: Diagnosis not present

## 2023-07-27 DIAGNOSIS — Z87442 Personal history of urinary calculi: Secondary | ICD-10-CM | POA: Diagnosis not present

## 2023-07-27 DIAGNOSIS — I2510 Atherosclerotic heart disease of native coronary artery without angina pectoris: Secondary | ICD-10-CM | POA: Insufficient documentation

## 2023-07-27 DIAGNOSIS — Z87891 Personal history of nicotine dependence: Secondary | ICD-10-CM | POA: Insufficient documentation

## 2023-07-27 DIAGNOSIS — Z79899 Other long term (current) drug therapy: Secondary | ICD-10-CM | POA: Insufficient documentation

## 2023-07-27 DIAGNOSIS — Z7901 Long term (current) use of anticoagulants: Secondary | ICD-10-CM | POA: Insufficient documentation

## 2023-07-27 MED ORDER — IRON SUCROSE 20 MG/ML IV SOLN
200.0000 mg | Freq: Once | INTRAVENOUS | Status: AC
  Administered 2023-07-27: 200 mg via INTRAVENOUS
  Filled 2023-07-27: qty 10

## 2023-07-27 NOTE — Patient Instructions (Signed)

## 2023-07-30 ENCOUNTER — Ambulatory Visit: Payer: Self-pay | Admitting: Family Medicine

## 2023-07-30 ENCOUNTER — Other Ambulatory Visit: Payer: Self-pay

## 2023-07-30 ENCOUNTER — Telehealth: Payer: Self-pay

## 2023-07-30 DIAGNOSIS — R7309 Other abnormal glucose: Secondary | ICD-10-CM

## 2023-07-30 DIAGNOSIS — D510 Vitamin B12 deficiency anemia due to intrinsic factor deficiency: Secondary | ICD-10-CM

## 2023-07-30 DIAGNOSIS — N401 Enlarged prostate with lower urinary tract symptoms: Secondary | ICD-10-CM

## 2023-07-30 DIAGNOSIS — M170 Bilateral primary osteoarthritis of knee: Secondary | ICD-10-CM

## 2023-07-30 DIAGNOSIS — E782 Mixed hyperlipidemia: Secondary | ICD-10-CM

## 2023-07-30 DIAGNOSIS — I10 Essential (primary) hypertension: Secondary | ICD-10-CM

## 2023-07-30 DIAGNOSIS — Z0000 Encounter for general adult medical examination without abnormal findings: Secondary | ICD-10-CM

## 2023-07-30 NOTE — Telephone Encounter (Signed)
 Telephone call from patient asking if he could come in and have fluid drawn off his knee. He states that Dr. Marcelino has done this once before.

## 2023-07-31 ENCOUNTER — Ambulatory Visit: Attending: Orthopedic Surgery

## 2023-07-31 ENCOUNTER — Other Ambulatory Visit

## 2023-07-31 DIAGNOSIS — M6281 Muscle weakness (generalized): Secondary | ICD-10-CM | POA: Diagnosis not present

## 2023-07-31 DIAGNOSIS — R7309 Other abnormal glucose: Secondary | ICD-10-CM | POA: Diagnosis not present

## 2023-07-31 DIAGNOSIS — G8929 Other chronic pain: Secondary | ICD-10-CM | POA: Insufficient documentation

## 2023-07-31 DIAGNOSIS — M25512 Pain in left shoulder: Secondary | ICD-10-CM | POA: Insufficient documentation

## 2023-07-31 DIAGNOSIS — I10 Essential (primary) hypertension: Secondary | ICD-10-CM | POA: Diagnosis not present

## 2023-07-31 DIAGNOSIS — E782 Mixed hyperlipidemia: Secondary | ICD-10-CM | POA: Diagnosis not present

## 2023-07-31 DIAGNOSIS — D510 Vitamin B12 deficiency anemia due to intrinsic factor deficiency: Secondary | ICD-10-CM | POA: Diagnosis not present

## 2023-07-31 NOTE — Telephone Encounter (Signed)
 Pt scheduled for 11/28/23

## 2023-07-31 NOTE — Addendum Note (Signed)
 Addended by: MARCELINO NURSE on: 07/31/2023 03:57 PM   Modules accepted: Orders

## 2023-07-31 NOTE — Therapy (Signed)
 OUTPATIENT PHYSICAL THERAPY SHOULDER TREATMENT  Patient Name: [name] MRN: [id number] DOB:12-07-1955, 68 y.o., male Today's Date: 08/01/2023   PT End of Session - 07/31/23 1614     Visit Number 15    Number of Visits 25    Date for PT Re-Evaluation 08/20/23    Authorization Type eval: 02/26/23, [employer] AUTH Approved #[id number] 3 visits between 07/09/23-08/20/23  [facility] Medicare 2025, Copay: $20  OOP:$3900/$0 used  [name] is req  Ref #:[id number]    Authorization Time Period --    Authorization - Visit Number 2    Authorization - Number of Visits 3    PT Start Time 1620    PT Stop Time 1705    PT Time Calculation (min) 45 min    Activity Tolerance Patient tolerated treatment well    Behavior During Therapy WFL for tasks assessed/performed         Past Medical History:  Diagnosis Date   (HFpEF) heart failure with preserved ejection fraction (HCC)    a.) TTE 12/26/2013: EF 45-50%, mild ant and antsept HK. mild MR. Mod dil LA. nl RV fxn. Rhythm was Afib; b.) TTE 07/04/2019: EF 55%, mid-apical anteroseptal HK, mild MAC, mild Ao sclerosis, G2DD, RVSP 45.3; c.) TTE 06/02/2022: EF 55-60%, no RWMAs, sev LAE, RVSP 37.9, mild-mod MR, mild AR, AoV sclerosis without stenosis, asc Ao 38 mm   Anxiety    Aortic atherosclerosis (HCC)    Cardiomyopathy (in setting of Afib)    a.) TTE 12/26/2013: EF 45-50%; b.) TTE 07/04/2019: EF 55%; c.) TTE 06/02/2022: EF 55-60%   Chronic pain syndrome    a.) followed by pain management   Chronic, continuous use of opioids    a.) on COT; followed by pain management   Coronary artery disease 11/06/2014   a.) cCTA 11/06/2014: Ca2+ = 224 (74th %ile; LAD distribution)   Diverticulosis    DJD (degenerative joint disease) of knee    Former smoker    History of hiatal hernia    History of kidney stones 2012   Hyperlipidemia    Hyperplastic colon polyp    Hypertension    Inguinal hernia, left    Internal hemorrhoids    Intervertebral disc disorder with radiculopathy of  lumbosacral region    Lesion of bone of lumbosacral spine    L5   Long term current use of amiodarone     Lower extremity edema    Mixed hyperlipidemia    Morbid obesity (HCC)    Multiple falls    Myalgia due to statin    On rivaroxaban  therapy    Osteoarthritis    PAF (paroxysmal atrial fibrillation) (HCC)    a.) CHA2DS2VASc = 4 (age, HTN, CHF, vascular disease) as of 02/19/23;  b.) s/p DCCV 03/13/14 (200 J), 05/15/19 (150 J x 1, 200 J x2), 05/19/2022 (150 J x1, 200 J x2; pads changed to ant/lat postion with additional 200 J x 2 -> did not convert), 07/25/22 (200 J), 09/21/2022 (200 J); c.) s/p PVI ablation 11/17/2022; d.) rate/rhythm maintained on amiodarone ; chronically anticoagulated on rivaroxaban    Pernicious anemia    Prostate cancer (HCC)    Pulmonary nodule, right    a. 10/2014 Cardiac CTA: 7mm RLL nodule; b. 04/2015 CT Chest: stable 7mm RLL nodule. No new nodules; 02/2017 CTA Chest: stable, benign, 7mm RLL pulm nodule.   Rotator cuff tear, left    Sleep difficulties    a.) takes melatonin + trazodone  PRN   SVT (supraventricular tachycardia) (HCC)  Tubular adenoma of colon    Past Surgical History:  Procedure Laterality Date   ATRIAL FIBRILLATION ABLATION N/A 11/17/2022   Procedure: ATRIAL FIBRILLATION ABLATION; Location: [facility]; Surgeon: Gehi, Anil, MD   BICEPT TENODESIS Right 09/27/2021   Procedure: Right reverse shoulder arthroplasty, biceps tenodesis;  Surgeon: Tobie Priest, MD;  Location: [facility];  Service: Orthopedics;  Laterality: Right;   CARDIOVERSION N/A 05/15/2019   Procedure: CARDIOVERSION;  Surgeon: Perla Evalene PARAS, MD;  Location: [facility];  Service: Cardiovascular;  Laterality: N/A;   CARDIOVERSION N/A 03/13/2014   Procedure: CARDIOVERSION; Location: [facility]; Surgeon: Evalene Perla, MD   CARDIOVERSION N/A 03/24/2022   Procedure: CARDIOVERSION;  Surgeon: Perla Evalene PARAS, MD;  Location: [facility];  Service: Cardiovascular;  Laterality: N/A;   CARDIOVERSION N/A  05/19/2022   Procedure: CARDIOVERSION;  Surgeon: Perla Evalene PARAS, MD;  Location: [facility];  Service: Cardiovascular;  Laterality: N/A;   CARDIOVERSION N/A 07/25/2022   Procedure: CARDIOVERSION;  Surgeon: Fernande Elspeth BROCKS, MD;  Location: [facility];  Service: Cardiovascular;  Laterality: N/A;   CARDIOVERSION N/A 09/21/2022   Procedure: CARDIOVERSION;  Surgeon: Fernande Elspeth BROCKS, MD;  Location: [facility];  Service: Cardiovascular;  Laterality: N/A;   CARPAL TUNNEL RELEASE Right 12/23/2014   Procedure: CARPAL TUNNEL RELEASE;  Surgeon: Kayla Pinal, MD;  Location: [facility];  Service: Orthopedics;  Laterality: Right;   CARPAL TUNNEL RELEASE Left 01/06/2015   Procedure: CARPAL TUNNEL RELEASE;  Surgeon: Kayla Pinal, MD;  Location: [facility];  Service: Orthopedics;  Laterality: Left;   COLONOSCOPY WITH PROPOFOL  N/A 10/08/2017   Procedure: COLONOSCOPY WITH PROPOFOL ;  Surgeon: Unk Corinn Skiff, MD;  Location: [facility];  Service: Gastroenterology;  Laterality: N/A;   COLONOSCOPY WITH PROPOFOL  N/A 06/07/2022   Procedure: COLONOSCOPY WITH PROPOFOL ;  Surgeon: Unk Corinn Skiff, MD;  Location: [facility];  Service: Gastroenterology;  Laterality: N/A;   ENTEROSCOPY  06/07/2022   Procedure: ENTEROSCOPY;  Surgeon: Unk Corinn Skiff, MD;  Location[facility];  Service: Gastroenterology;;   ESOPHAGOGASTRODUODENOSCOPY (EGD) WITH PROPOFOL  N/A 06/07/2022   Procedure: ESOPHAGOGASTRODUODENOSCOPY (EGD) WITH PROPOFOL ;  Surgeon: Unk Corinn Skiff, MD;  Location: [facility];  Service: Gastroenterology;  Laterality: N/A;   GASTRIC BYPASS  01/17/2000   KNEE SURGERY Right    knee trauma x3   prostate seeding     REVERSE SHOULDER ARTHROPLASTY Right 09/27/2021   Procedure: Right reverse shoulder arthroplasty, biceps tenodesis;  Surgeon: Tobie Priest, MD;  Location: [facility];  Service: Orthopedics;  Laterality: Right;   REVERSE SHOULDER ARTHROPLASTY Left 02/20/2023   Procedure: Left reverse shoulder  arthroplasty, biceps tenodesis;  Surgeon: Tobie Priest, MD;  Location: [facility];  Service: Orthopedics;  Laterality: Left;   SHOULDER ARTHROSCOPY WITH ROTATOR CUFF REPAIR AND OPEN BICEPS TENODESIS Left 12/27/2021   Procedure: Left shoulder arthroscopic cuff repair (supraspinatus and subscapularis) with Regeneten Patch application;  Surgeon: Tobie Priest, MD;  Location: [facility];  Service: Orthopedics;  Laterality: Left;   Patient Active Problem List   Diagnosis Date Noted   Symptomatic anemia 04/26/2022   Chronic pain of both knees 03/30/2022   Bilateral primary osteoarthritis of knee 03/30/2022   Shortness of breath 03/24/2022   Vitamin D  deficiency 02/24/2022   S/p reverse total shoulder arthroplasty 09/27/2021   Lesion of bone of lumbosacral spine (L5) 05/12/2021   Localized osteoarthritis of shoulder regions, bilateral 03/29/2021   Chronic pain of both shoulders 03/29/2021   Drug-induced myopathy 02/21/2021   Trigger finger of right hand 11/15/2020   Prostate cancer (HCC) 07/26/2020  Insomnia 08/01/2019   Primary osteoarthritis of both wrists 02/17/2019   Trigger middle finger of left hand 02/17/2019   Chronic pain syndrome 02/17/2019   Chronic radicular lumbar pain 10/08/2018   Lumbar radiculopathy 10/08/2018   Lumbar degenerative disc disease 10/08/2018   Lumbar facet arthropathy 10/08/2018   Lumbar facet joint syndrome 10/08/2018   Intervertebral disc disorder with radiculopathy of lumbosacral region    Osteoarthritis of knee 11/30/2017   Osteoarthritis of wrist 11/30/2017   Pernicious anemia 05/22/2016   Persistent atrial fibrillation (HCC) 12/20/2015   Carpal tunnel syndrome 11/16/2014   DJD (degenerative joint disease) of knee 10/27/2014   Bilateral carpal tunnel syndrome 10/27/2014   Morbid obesity with BMI of 45.0-49.9, adult (HCC) 10/27/2014   Mixed hyperlipidemia 10/27/2014   H/O gastric bypass 03/20/2014   Hyperkalemia 03/20/2014   History of prostate cancer  12/26/2013   Essential hypertension 12/26/2013   Encounter for anticoagulation discussion and counseling 12/26/2013   PCP: Dr. Edman  REFERRING PROVIDER: Dr. Earnestine Blanch  REFERRING DIAG: [phone] (ICD-10-CM) - Complete rotator cuff tear or rupture of left shoulder, not specified as traumatic   RATIONALE FOR EVALUATION AND TREATMENT: Rehabilitation  THERAPY DIAG: Chronic left shoulder pain  Muscle weakness (generalized)  ONSET DATE: 02/20/23  FOLLOW-UP APPT SCHEDULED WITH REFERRING PROVIDER: Yes   FROM INITIAL EVALUATION SUBJECTIVE:                                                                                                                                                                                         SUBJECTIVE STATEMENT:  L reverse TSR 02/20/23  PERTINENT HISTORY:  [name] is a 68 y.o. male who initially underwent arthroscopic subscapularis repair and side-to-side supraspinatus repair with Regeneten patch application by Dr. Blanch 12/27/2021. He underwent post-surgical physical therapy with initial minimal improvement followed by worsening shoulder strength/function. Repeat MRI showed large, retracted subscapularis re-tear and supraspinatus re-tear with signs of rotator cuff arthropathy. Conservative measures including medications, physical therapy, and cortisone injections did not provide adequate relief. The patient elected to proceed with reverse shoulder arthroplasty and biceps tenodesis on 02/20/23. Intraoperative findings include retracted full-thickness subscapularis tear, partial-thickness supraspinatus tear, and biceps tendinopathy. Surgery was a standard deltopectoral incision under general anesthesia along with an interscalene block. Per op note, operative arm to remain in sling at all times except ROM exercises and hygiene. Can perform pendulums, elbow/wrist/hand ROM exercises. Passive ROM allowed to 90 FF and 30 ER. ASA 325mg  x 6 weeks for DVT ppx (pt states  that he was actually instructed to start back on his Xarelto  and not to use ASA). Plan for PT starting on POD #  3-4. Pt denies any post-operative complications from surgery. Pt has a history of chronic back pain and has undergone multiple lumbar facet, medial branch radiofrequency ablations. He also has a past history of cardiomyopathy (in setting of Afib), DJD (degenerative joint disease) of knee, kidney stones, lower extremity edema, PAF (paroxysmal atrial fibrillation), pernicious anemia, prostate cancer, and a pulmonary nodule. He also has a past surgical history that includes R reverse TSR, knee surgery, gastric bypass (2002), prostate seeding, carpal tunnel release (Right, 12/23/2014; Left, 01/06/2015), and multiple cardioversions.   PAIN:  Pain Intensity: No resting pain upon arrival Pain location: L shoulder Pain Quality: Nerve pain Radiating: No  Numbness/Tingling: No Focal Weakness: Yes 24-hour pain behavior: Varies History of prior shoulder or neck/shoulder injury, pain, surgery, or therapy: Yes PT for L shoulder s/p RTC repair, history of R reverse TSR; Dominant hand: right Red flags Positive: history of prostate CA, Negative: chills/fever, night sweats, nausea, vomiting, unrelenting pain, unexplained weight gain/loss  PRECAUTIONS: Shoulder, per operative note: operative arm to remain in sling at all times except ROM exercises and hygiene. Can perform pendulums, elbow/wrist/hand ROM exercises. Passive ROM allowed to 90 FF and 30 ER.   General reverse TSR precautions include no combined shoulder extension, IR, and adduction. No shoulder and elbow AROM (bicep tenodesis) for at least the first 4 weeks   WEIGHT BEARING RESTRICTIONS: Yes no WB through LUE  FALLS: Has patient fallen in last 6 months? 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  Recent memory is intact.  Remote memory is intact.  Attention span and concentration are intact.  Expressive speech is intact.  Patient's fund of knowledge is within normal limits for educational level.    Gross Musculoskeletal Assessment Tremor: None Bulk: Normal Tone: Normal Incision is clean and dry with minimal blood staining on honeycomb dressing as well as minimal swelling around the incision. Skin is cool to touch and no signs of infection present.   Gait Deferred full gait assessment  Posture Forward head  with upper thoracic kyphosis and rounded shoulders  Cervical Screen Deferred  ROM ROM (Normal range in degrees)    Right (AROM) Left (PROM)  Shoulder    Flexion 160 90 (limited per protocol)  Extension    Abduction 180 90 (scaption)  External Rotation 88 0  Internal Rotation 70 stomach  Hands Behind Head    Hands Behind Back        Elbow    Flexion Full Full  Extension 0 0  Pronation WNL Full palm up  Supination WNL Full palm down  (* = pain; Blank rows = not tested)  UE MMT: MMT (out of 5) Right Left   Shoulder   Flexion 5   Extension    Abduction 5   External rotation 5   Internal rotation 5   Horizontal abduction    Horizontal adduction    Lower Trapezius    Rhomboids  Elbow  Flexion 5   Extension 5   Pronation 5   Supination 5       Wrist  Flexion 5 5  Extension 5 5  Radial deviation  5  Ulnar deviation  5      MCP   Flexion 5 5  Extension 5 5  Abduction 5 5  Adduction 5 5  (* = pain; Blank rows = not tested)  Sensation Deferred  Reflexes Deferred  Palpation No tenderness to light palpation around entire L shoulder girdle;  Passive Accessory Intervertebral Motion Deferred  Accessory Motions/Glides Deferred  Muscle Length Testing Deferred  SPECIAL TESTS Deferred  Beighton scale Deferred    TODAY'S TREATMENT: 07/31/23   Subjective: Patient reports ongoing constant L shoulder pain. It is particularly painful and weak when he reaches out to his L side. The ache is especially in the anterior part of the L shoulder radiating down the L bicep. He has an appt with his surgeon Dr. Tobie next week and is wondering whether he needs additional imaging for his L shoulder. He also has a follow-up scheduled with Dr. Marcelino for pain management related to his wrists and low back. No additional episodes of L shoulder instability/subluxation.    PAIN: Burning/ache    Ther-ex  Updated outcome measures with patient during visit today: QuickDASH: 56.8% L shoulder pain: worst: 10/10;  AROM AROM     Right Left  Shoulder    Flexion 180 160  Extension    Abduction 152 138  External Rotation 90 88  Internal Rotation 70 74  Hands Behind Head C6 C3  Hands Behind Back L1 Lateral waistline  (* = pain; Blank rows = not tested)  UE MMT: MMT (out of 5) Right Left   Shoulder   Flexion 5 4*  Abduction 5 4*  External rotation (seated) 5 4*  Internal rotation (seated) 4+ 4+*  Horizontal abduction Unable to lay prone Unable to lay prone  Horizontal adduction    Lower Trapezius    Rhomboids        Elbow  Flexion 5 5  Extension 5 5  Pronation 5 5  Supination 5 5*   Shoulder Flexion  Peak Force (lb) Right 21.09 lb Left 10.19 lb Strength Difference 10.90 lb* Percentage Difference 69.66%*  Shoulder Abduction Peak Force (lb) Right 14.57 lb Left 8.33 lb Strength Difference 6.24  lb* Percentage Difference 54.48%*  Shoulder External Rotation Peak Force (lb) Right 20.28 lb Left 16.90 lb Strength Difference 3.37 lb* Percentage Difference 18.15%*  Shoulder Internal Rotation Peak Force (lb) Right 19.77 lb Left 20.43 lb Strength Difference 0.66 lb Percentage Difference 3.28%  In semi-recumbent position: L shoulder flexion with 4# dumbbell x 10; L shoulder scaption with 4# DB x 5; L shoulder overhead press with 4# DB x 10; L shoulder manually resisted ER x 10; L shoulder manually resisted IR x 10; L elbow manually resisted flexion/extension x 10 each;   Not performed: Dowel chest press with manual resistance from therapist 2 x 10; Seated dowel rows with manual resistance from therapist 2 x 10; Nautilus lat pull downs with hand grips 95# 3 x 10; Nautilus rows with hand grips 50# 3 x 10;   HOME EXERCISE PROGRAM:  Access Code: [email or web address] URL: [email or web address] Date: 07/31/2023 Prepared by: Wayne Eck  Exercises - Seated Cervical Sidebending Stretch (Mirrored)  - 2 x daily - 7 x weekly - 3 reps - 30s hold - Seated Scapular Retraction  - 2 x daily -  7 x weekly - 2 sets - 10 reps - 3s hold - Standing Shoulder Flexion Full Range Single Arm  - 1 x daily - 7 x weekly - 2 sets - 10 reps - 3s hold - Standing Shoulder Abduction Full Range Single Arm  - 1 x daily - 7 x weekly - 2 sets - 10 reps - 3s hold - Wrist Flexion AROM  - 2 x daily - 7 x weekly - 2 sets - 10 reps - 3s hold - Wrist Extension AROM  - 1 x daily - 7 x weekly - 2 sets - 10 reps - 3s hold - Shoulder External Rotation and Scapular Retraction with Resistance  - 1 x daily - 7 x weekly - 2 sets - 10 reps - 3s hold    ASSESSMENT: Pt reports worsening L shoulder pain. He has an appt with his surgeon Dr. Tobie next week and is wondering whether he needs additional imaging for his L shoulder. Updated outcome measures and goals with patient during session today. His worst pain over the  last week has worsened to 10/10. His QuickDASH was 56.8% which is worse compared to the last update but has improved from 72.7% at the initial evaluation. Performed digital dynamometry and pt demonstrates a L shoulder flexion weakness of 69.7% compared to the R side and L shoulder abduction weakness of 54.5% compared to the L side. Additional time during session today focused on L shoulder strengthening. Unfortunately pt appears to be regressing and has plans to follow-up with his surgeon. However he would benefit from ongoing PT to prevent additional decline and hopefully regain L shoulder strength. Pt encouraged to continue HEP and follow up as directed. He will continue to benefit from skilled therapy to address remaining deficits in L shoulder weakness and pain order to improve quality of life and return to PLOF.     OBJECTIVE IMPAIRMENTS: decreased ROM, decreased strength, and pain.   ACTIVITY LIMITATIONS: carrying, lifting, bathing, toileting, dressing, and reach over head  PARTICIPATION LIMITATIONS: meal prep, cleaning, laundry, driving, shopping, community activity, and yard work  PERSONAL FACTORS: Past/current experiences, Time since onset of injury/illness/exacerbation, and 3+ comorbidities: OA, anemia, anxiety, cardiomyopathy, chronic pain are also affecting patient's functional outcome.   REHAB POTENTIAL: Good  CLINICAL DECISION MAKING: Unstable/unpredictable  EVALUATION COMPLEXITY: High   GOALS: Goals reviewed with patient? Yes  SHORT TERM GOALS: Target date: 07/09/2023   Pt will be independent with HEP to improve strength and decrease shoulder pain to improve pain-free function at home and work. Baseline: Issued and progressing Goal status: ONGOING   LONG TERM GOALS: Target date: 08/20/2023  1.  Pt will report no further L shoulder pain with all AROM in order to complete all ADLs and improve pain-free function. Baseline: 05/28/23: worst pain 7/10; 07/31/23: 10/10 Goal status:  WORSENING  2.  Pt will decrease QuickDASH score to below 30% in order to demonstrate clinically significant reduction in disability related to shoulder pain        Baseline: 02/26/23: 72.7%; 05/28/23: 40.9%; 07/31/23: 56.8% Goal status: PARTIALLY MET  3. Pt will increase strength of L shoulder flexion and scaption to at least 4/5 in order to demonstrate improvement in strength and function         Baseline: 02/26/23: Unable to test; 05/28/23: flexion: 4+/5, pain free, scaption: 4/5, pain; 07/31/23: see note above, painful Goal status: PARTIALLY MET  4. Pt will improve L shoulder AROM flexion and scaption to at least 110 degrees in  order to demonstrate improvement in function so he can take care of his property and complete all household responsibilities;        Baseline: 02/26/23: Unable to test (90 degrees PROM for both), 05/28/23: L shoulder AROM against gravity: flexion: 176, no pain, scaption: 130, pain/burning; 07/31/23: see note Goal status: ACHIEVED   PLAN: PT FREQUENCY: 1-2x/week  PT DURATION: 12 weeks  PLANNED INTERVENTIONS: Therapeutic exercises, Therapeutic activity, Neuromuscular re-education, Balance training, Gait training, Patient/Family education, Self Care, Joint mobilization, Joint manipulation, Vestibular training, Canalith repositioning, Orthotic/Fit training, DME instructions, Dry Needling, Electrical stimulation, Spinal manipulation, Spinal mobilization, Cryotherapy, Moist heat, Taping, Traction, Ultrasound, Ionotophoresis 4mg /ml Dexamethasone , Manual therapy, and Re-evaluation.  PLAN FOR NEXT SESSION: progress HEP, progress L shoulder ROM and strength per surgical protocol;  [name] PT, DPT, GCS  10:15 AM,08/01/23

## 2023-08-01 ENCOUNTER — Ambulatory Visit: Payer: Self-pay | Admitting: Internal Medicine

## 2023-08-01 LAB — CBC WITH DIFFERENTIAL/PLATELET
Absolute Lymphocytes: 1682 {cells}/uL (ref 850–3900)
Absolute Monocytes: 605 {cells}/uL (ref 200–950)
Basophils Absolute: 19 {cells}/uL (ref 0–200)
Basophils Relative: 0.3 %
Eosinophils Absolute: 69 {cells}/uL (ref 15–500)
Eosinophils Relative: 1.1 %
HCT: 44.3 % (ref 38.5–50.0)
Hemoglobin: 14.6 g/dL (ref 13.2–17.1)
MCH: 28.6 pg (ref 27.0–33.0)
MCHC: 33 g/dL (ref 32.0–36.0)
MCV: 86.9 fL (ref 80.0–100.0)
MPV: 9.1 fL (ref 7.5–12.5)
Monocytes Relative: 9.6 %
Neutro Abs: 3925 {cells}/uL (ref 1500–7800)
Neutrophils Relative %: 62.3 %
Platelets: 193 Thousand/uL (ref 140–400)
RBC: 5.1 Million/uL (ref 4.20–5.80)
RDW: 13.9 % (ref 11.0–15.0)
Total Lymphocyte: 26.7 %
WBC: 6.3 Thousand/uL (ref 3.8–10.8)

## 2023-08-01 LAB — COMPREHENSIVE METABOLIC PANEL WITH GFR
AG Ratio: 2 (calc) (ref 1.0–2.5)
ALT: 11 U/L (ref 9–46)
AST: 14 U/L (ref 10–35)
Albumin: 4.3 g/dL (ref 3.6–5.1)
Alkaline phosphatase (APISO): 74 U/L (ref 35–144)
BUN: 16 mg/dL (ref 7–25)
CO2: 27 mmol/L (ref 20–32)
Calcium: 9 mg/dL (ref 8.6–10.3)
Chloride: 107 mmol/L (ref 98–110)
Creat: 0.9 mg/dL (ref 0.70–1.35)
Globulin: 2.1 g/dL (ref 1.9–3.7)
Glucose, Bld: 97 mg/dL (ref 65–99)
Potassium: 4.6 mmol/L (ref 3.5–5.3)
Sodium: 141 mmol/L (ref 135–146)
Total Bilirubin: 0.5 mg/dL (ref 0.2–1.2)
Total Protein: 6.4 g/dL (ref 6.1–8.1)
eGFR: 92 mL/min/1.73m2 (ref >=60)

## 2023-08-01 LAB — HEMOGLOBIN A1C
Hgb A1c MFr Bld: 5.7 % — ABNORMAL HIGH (ref <5.7)
Mean Plasma Glucose: 117 mg/dL
eAG (mmol/L): 6.5 mmol/L

## 2023-08-01 LAB — LIPID PANEL
Cholesterol: 246 mg/dL — ABNORMAL HIGH (ref <200)
HDL: 70 mg/dL (ref >=40)
LDL Cholesterol (Calc): 153 mg/dL — ABNORMAL HIGH
Non-HDL Cholesterol (Calc): 176 mg/dL — ABNORMAL HIGH (ref <130)
Total CHOL/HDL Ratio: 3.5 (calc) (ref <5.0)
Triglycerides: 115 mg/dL (ref <150)

## 2023-08-01 LAB — PSA: PSA: 0.05 ng/mL (ref <=4.00)

## 2023-08-01 LAB — TSH: TSH: 1.39 m[IU]/L (ref 0.40–4.50)

## 2023-08-06 ENCOUNTER — Ambulatory Visit: Admitting: Family Medicine

## 2023-08-06 VITALS — BP 110/72 | HR 101 | Ht 73.0 in | Wt 353.3 lb

## 2023-08-06 DIAGNOSIS — E782 Mixed hyperlipidemia: Secondary | ICD-10-CM

## 2023-08-06 DIAGNOSIS — I10 Essential (primary) hypertension: Secondary | ICD-10-CM | POA: Diagnosis not present

## 2023-08-06 DIAGNOSIS — Z0000 Encounter for general adult medical examination without abnormal findings: Secondary | ICD-10-CM | POA: Diagnosis not present

## 2023-08-06 DIAGNOSIS — R7309 Other abnormal glucose: Secondary | ICD-10-CM | POA: Diagnosis not present

## 2023-08-06 DIAGNOSIS — Z23 Encounter for immunization: Secondary | ICD-10-CM | POA: Diagnosis not present

## 2023-08-06 DIAGNOSIS — F419 Anxiety disorder, unspecified: Secondary | ICD-10-CM

## 2023-08-06 DIAGNOSIS — R600 Localized edema: Secondary | ICD-10-CM

## 2023-08-06 MED ORDER — FUROSEMIDE 20 MG PO TABS: 20.0000 mg | ORAL_TABLET | Freq: Every day | ORAL | 3 refills | Status: DC | PRN

## 2023-08-06 MED ORDER — DULOXETINE HCL 60 MG PO CPEP: 60.0000 mg | ORAL_CAPSULE | Freq: Every day | ORAL | 1 refills | Status: DC

## 2023-08-06 MED ORDER — ATORVASTATIN CALCIUM 10 MG PO TABS: 10.0000 mg | ORAL_TABLET | ORAL | 3 refills | Status: DC

## 2023-08-06 NOTE — Progress Notes (Signed)
 Subjective:    Patient ID: [name], male    DOB: 04-Feb-1955, 68 y.o.   MRN: [id number]  [name] is a 68 y.o. male presenting on 08/06/2023 for Annual Exam   HPI  Discussed the use of AI scribe software for clinical note transcription with the patient, who gave verbal consent to proceed.  History of Present Illness   [name] is a 68 year old male who presents for an annual physical exam.  Immunization status - Tetanus vaccine last administered in 2014, due for booster - Considering pneumococcal and shingles vaccines - Hesitant about shingles vaccine due to prior chickenpox infection  Prostate cancer surveillance - History of prostate cancer treated with brachytherapy - PSA levels remain very low and undetectable at 0.05  Metabolic and endocrine function - Thyroid  function within normal limits - Recent laboratory studies show normal blood counts, kidney, and liver function - Electrolytes (sodium, potassium, calcium , chloride) within normal ranges  Glycemic control - A1c levels have ranged from 5.3 to 5.7 over the past few years - Recent A1c peaked at 5.7, attributed to increased candy consumption  Peripheral edema and diuresis - Experiences fluid retention, particularly in the knees - Prescribed furosemide  20 mg as needed - Significant leg edema reduces overnight, resulting in bed wet from sweating  Weight management concerns - Interested in weight management options, including medications similar to those used by a family member - Concerned about cost and insurance coverage for weight management medications     HYPERLIPIDEMIA: - Reports concerns. Last lipid panel 07/2023, elevated LDL 150s now off statin Previously tried Atorvastatin  40mg , 10mg  and Rosuvastatin  10mg  also Zetia  with myalgia Not on med now  Symptomatic anemia, IDA Followed by Dr Rennie [facility] S/p IV iron  infusions with improvement  Orthopedic update in future, has had issue with knee  swelling, Right side.  Persistent AFib - resolved s/p cardioversion Followed by Dr Gollan previously and Dr Fernande, successfully cardioverted the AFib   Followed by Dr Ebbie [facility] On Cymbalta  30mg  daily, and asked if we can adjust dose to double dose 60mg  Not returning to Rheumatology, he will return to Dr Marcelino for arthritis   Upcoming L shoulder repair, cow tissue before, now needs repeat CT imaging and future surgery repair.   Vitamin D  deficiency   Vitamin C Low He will take OTC supplement.   Health Maintenance:   PSA Undetectable 0.05  Last Colonoscopy 06/07/22 repeat 5 years, 2029  Past Surgical History:  Procedure Laterality Date   ATRIAL FIBRILLATION ABLATION N/A 11/17/2022   Procedure: ATRIAL FIBRILLATION ABLATION; Location: [facility]; Surgeon: Gehi, Anil, MD   BICEPT TENODESIS Right 09/27/2021   Procedure: Right reverse shoulder arthroplasty, biceps tenodesis;  Surgeon: Tobie Priest, MD;  Location: [facility];  Service: Orthopedics;  Laterality: Right;   CARDIOVERSION N/A 05/15/2019   Procedure: CARDIOVERSION;  Surgeon: Perla Evalene JINNY, MD;  Location: [facility];  Service: Cardiovascular;  Laterality: N/A;   CARDIOVERSION N/A 03/13/2014   Procedure: CARDIOVERSION; Location: [facility]; Surgeon: Evalene Perla, MD   CARDIOVERSION N/A 03/24/2022   Procedure: CARDIOVERSION;  Surgeon: Perla Evalene JINNY, MD;  Location: [facility];  Service: Cardiovascular;  Laterality: N/A;   CARDIOVERSION N/A 05/19/2022   Procedure: CARDIOVERSION;  Surgeon: Perla Evalene JINNY, MD;  Location: [facility];  Service: Cardiovascular;  Laterality: N/A;   CARDIOVERSION N/A 07/25/2022   Procedure: CARDIOVERSION;  Surgeon: Fernande Elspeth BROCKS, MD;  Location: [facility];  Service: Cardiovascular;  Laterality: N/A;  CARDIOVERSION N/A 09/21/2022   Procedure: CARDIOVERSION;  Surgeon: Fernande Elspeth BROCKS, MD;  Location: [facility];  Service: Cardiovascular;  Laterality: N/A;   CARPAL TUNNEL RELEASE Right 12/23/2014    Procedure: CARPAL TUNNEL RELEASE;  Surgeon: Kayla Pinal, MD;  Location: [facility];  Service: Orthopedics;  Laterality: Right;   CARPAL TUNNEL RELEASE Left 01/06/2015   Procedure: CARPAL TUNNEL RELEASE;  Surgeon: Kayla Pinal, MD;  Location: [facility];  Service: Orthopedics;  Laterality: Left;   COLONOSCOPY WITH PROPOFOL  N/A 10/08/2017   Procedure: COLONOSCOPY WITH PROPOFOL ;  Surgeon: Unk Corinn Skiff, MD;  Location: [facility] END[facility]COPY;  Service: Gastroenterology;  Laterality: N/A;   COLONOSCOPY WITH PROPOFOL  N/A 06/07/2022   Procedure: COLONOSCOPY WITH PROPOFOL ;  Surgeon: Unk Corinn Skiff, MD;  Location: [facility] ENDOSCOPY;  Service: Gastroenterology;  Laterality: N/A;   ENTEROSCOPY  06/07/2022   Procedure: ENTEROSCOPY;  Surgeon: Unk Corinn Skiff, MD;  Location: [facility] ENDOSCOPY;  Service: Gastroenterology;;   ESOPHAGOGASTRODUODENOSCOPY (EGD) WITH PROPOFOL  N/A 06/07/2022   Procedure: ESOPHAGOGASTRODUODENOSCOPY (EGD) WITH PROPOFOL ;  Surgeon: Unk Corinn Skiff, MD;  Location: [facility] ENDOSCOPY;  Service: Gastroenterology;  Laterality: N/A;   GASTRIC BYPASS  01/17/2000   KNEE SURGERY Right    knee trauma x3   prostate seeding     REVERSE SHOULDER ARTHROPLASTY Right 09/27/2021   Procedure: Right reverse shoulder arthroplasty, biceps tenodesis;  Surgeon: Tobie Priest, MD;  Location: [facility];  Service: Orthopedics;  Laterality: Right;   REVERSE SHOULDER ARTHROPLASTY Left 02/20/2023   Procedure: Left reverse shoulder arthroplasty, biceps tenodesis;  Surgeon: Tobie Priest, MD;  Location: [facility];  Service: Orthopedics;  Laterality: Left;   SHOULDER ARTHROSCOPY WITH ROTATOR CUFF REPAIR AND OPEN BICEPS TENODESIS Left 12/27/2021   Procedure: Left shoulder arthroscopic cuff repair (supraspinatus and subscapularis) with Regeneten Patch application;  Surgeon: Tobie Priest, MD;  Location: [facility];  Service: Orthopedics;  Laterality: Left;         08/06/2023    1:15 PM 05/30/2023   10:11 AM 05/08/2023     3:13 PM  Depression screen PHQ 2/9  Decreased Interest 1 0 2  Down, Depressed, Hopeless 0 0 1  PHQ - 2 Score 1 0 3  Altered sleeping 1  3  Tired, decreased energy 3  0  Change in appetite 0  0  Feeling bad or failure about yourself  0  0  Trouble concentrating 1  2  Moving slowly or fidgety/restless 0  2  Suicidal thoughts 0  0  PHQ-9 Score 6  10  Difficult doing work/chores Very difficult  Extremely dIfficult       08/06/2023    1:15 PM 05/08/2023    3:14 PM 01/24/2023    8:24 AM 08/01/2022    1:22 PM  GAD 7 : Generalized Anxiety Score  Nervous, Anxious, on Edge 1 2 1 1   Control/stop worrying 0 1 0 0  Worry too much - different things 0 0 0 1  Trouble relaxing 0 0 1 0  Restless 0 0 0 0  Easily annoyed or irritable 1 2 1 1   Afraid - awful might happen 0 1 0 0  Total GAD 7 Score 2 6 3 3   Anxiety Difficulty Somewhat difficult Very difficult       Past Medical History:  Diagnosis Date   (HFpEF) heart failure with preserved ejection fraction (HCC)    a.) TTE 12/26/2013: EF 45-50%, mild ant and antsept HK. mild MR. Mod dil LA. nl RV fxn. Rhythm was Afib; b.) TTE 07/04/2019: EF  55%, mid-apical anteroseptal HK, mild MAC, mild Ao sclerosis, G2DD, RVSP 45.3; c.) TTE 06/02/2022: EF 55-60%, no RWMAs, sev LAE, RVSP 37.9, mild-mod MR, mild AR, AoV sclerosis without stenosis, asc Ao 38 mm   Anxiety    Aortic atherosclerosis (HCC)    Cardiomyopathy (in setting of Afib)    a.) TTE 12/26/2013: EF 45-50%; b.) TTE 07/04/2019: EF 55%; c.) TTE 06/02/2022: EF 55-60%   Chronic pain syndrome    a.) followed by pain management   Chronic, continuous use of opioids    a.) on COT; followed by pain management   Coronary artery disease 11/06/2014   a.) cCTA 11/06/2014: Ca2+ = 224 (74th %ile; LAD distribution)   Diverticulosis    DJD (degenerative joint disease) of knee    Former smoker    History of hiatal hernia    History of kidney stones 2012   Hyperlipidemia    Hyperplastic colon polyp     Hypertension    Inguinal hernia, left    Internal hemorrhoids    Intervertebral disc disorder with radiculopathy of lumbosacral region    Lesion of bone of lumbosacral spine    L5   Long term current use of amiodarone     Lower extremity edema    Mixed hyperlipidemia    Morbid obesity (HCC)    Multiple falls    Myalgia due to statin    On rivaroxaban  therapy    Osteoarthritis    PAF (paroxysmal atrial fibrillation) (HCC)    a.) CHA2DS2VASc = 4 (age, HTN, CHF, vascular disease) as of 02/19/23;  b.) s/p DCCV 03/13/14 (200 J), 05/15/19 (150 J x 1, 200 J x2), 05/19/2022 (150 J x1, 200 J x2; pads changed to ant/lat postion with additional 200 J x 2 -> did not convert), 07/25/22 (200 J), 09/21/2022 (200 J); c.) s/p PVI ablation 11/17/2022; d.) rate/rhythm maintained on amiodarone ; chronically anticoagulated on rivaroxaban    Pernicious anemia    Prostate cancer (HCC)    Pulmonary nodule, right    a. 10/2014 Cardiac CTA: 7mm RLL nodule; b. 04/2015 CT Chest: stable 7mm RLL nodule. No new nodules; 02/2017 CTA Chest: stable, benign, 7mm RLL pulm nodule.   Rotator cuff tear, left    Sleep difficulties    a.) takes melatonin + trazodone  PRN   SVT (supraventricular tachycardia) (HCC)    Tubular adenoma of colon    Past Surgical History:  Procedure Laterality Date   ATRIAL FIBRILLATION ABLATION N/A 11/17/2022   Procedure: ATRIAL FIBRILLATION ABLATION; Location: [facility]; Surgeon: Gehi, Anil, MD   BICEPT TENODESIS Right 09/27/2021   Procedure: Right reverse shoulder arthroplasty, biceps tenodesis;  Surgeon: Tobie Priest, MD;  Location: [facility];  Service: Orthopedics;  Laterality: Right;   CARDIOVERSION N/A 05/15/2019   Procedure: CARDIOVERSION;  Surgeon: Perla Evalene PARAS, MD;  Location: [facility];  Service: Cardiovascular;  Laterality: N/A;   CARDIOVERSION N/A 03/13/2014   Procedure: CARDIOVERSION; Location: [facility]; Surgeon: Evalene Perla, MD   CARDIOVERSION N/A 03/24/2022   Procedure: CARDIOVERSION;   Surgeon: Perla Evalene PARAS, MD;  Location: [facility];  Service: Cardiovascular;  Laterality: N/A;   CARDIOVERSION N/A 05/19/2022   Procedure: CARDIOVERSION;  Surgeon: Perla Evalene PARAS, MD;  Location: [facility];  Service: Cardiovascular;  Laterality: N/A;   CARDIOVERSION N/A 07/25/2022   Procedure: CARDIOVERSION;  Surgeon: Fernande Elspeth BROCKS, MD;  Location: [facility];  Service: Cardiovascular;  Laterality: N/A;   CARDIOVERSION N/A 09/21/2022   Procedure: CARDIOVERSION;  Surgeon: Fernande Elspeth BROCKS, MD;  Location: [facility];  Service: Cardiovascular;  Laterality: N/A;   CARPAL TUNNEL RELEASE Right 12/23/2014   Procedure: CARPAL TUNNEL RELEASE;  Surgeon: Kayla Pinal, MD;  Location: [facility];  Service: Orthopedics;  Laterality: Right;   CARPAL TUNNEL RELEASE Left 01/06/2015   Procedure: CARPAL TUNNEL RELEASE;  Surgeon: Kayla Pinal, MD;  Location: [facility];  Service: Orthopedics;  Laterality: Left;   COLONOSCOPY WITH PROPOFOL  N/A 10/08/2017   Procedure: COLONOSCOPY WITH PROPOFOL ;  Surgeon: Unk Corinn Skiff, MD;  Location: [facility] ENDOSCOPY;  Service: Gastroenterology;  Laterality: N/A;   COLONOSCOPY WITH PROPOFOL  N/A 06/07/2022   Procedure: COLONOSCOPY WITH PROPOFOL ;  Surgeon: Unk Corinn Skiff, MD;  Location: [facility] ENDOSCOPY;  Service: Gastroenterology;  Laterality: N/A;   ENTEROSCOPY  06/07/2022   Procedure: ENTEROSCOPY;  Surgeon: Unk Corinn Skiff, MD;  Location: [facility] ENDOSCOPY;  Service: Gastroenterology;;   ESOPHAGOGASTRODUODENOSCOPY (EGD) WITH PROPOFOL  N/A 06/07/2022   Procedure: ESOPHAGOGASTRODUODENOSCOPY (EGD) WITH PROPOFOL ;  Surgeon: Unk Corinn Skiff, MD;  Location: [facility] ENDOSCOPY;  Service: Gastroenterology;  Laterality: N/A;   GASTRIC BYPASS  01/17/2000   KNEE SURGERY Right    knee trauma x3   prostate seeding     REVERSE SHOULDER ARTHROPLASTY Right 09/27/2021   Procedure: Right reverse shoulder arthroplasty, biceps tenodesis;  Surgeon: Tobie Priest, MD;  Location: [facility];  Service:  Orthopedics;  Laterality: Right;   REVERSE SHOULDER ARTHROPLASTY Left 02/20/2023   Procedure: Left reverse shoulder arthroplasty, biceps tenodesis;  Surgeon: Tobie Priest, MD;  Location: [facility];  Service: Orthopedics;  Laterality: Left;   SHOULDER ARTHROSCOPY WITH ROTATOR CUFF REPAIR AND OPEN BICEPS TENODESIS Left 12/27/2021   Procedure: Left shoulder arthroscopic cuff repair (supraspinatus and subscapularis) with Regeneten Patch application;  Surgeon: Tobie Priest, MD;  Location: [facility];  Service: Orthopedics;  Laterality: Left;   Social History   Socioeconomic History   Marital status: Married    Spouse name: Madeline   Number of children: Not on file   Years of education: Not on file   Highest education level: Associate degree: occupational, Scientist, product/process development, or vocational program  Occupational History   Not on file  Tobacco Use   Smoking status: Former    Current packs/day: 0.00    Average packs/day: 1 pack/day for 10.0 years (10.0 ttl pk-yrs)    Types: Cigarettes    Start date: 02/17/1969    Quit date: 02/18/1979    Years since quitting: 44.4   Smokeless tobacco: Former    Types: Snuff  Vaping Use   Vaping status: Never Used  Substance and Sexual Activity   Alcohol  use: Not Currently    Comment: last drink in 2021   Drug use: No   Sexual activity: Not on file  Other Topics Concern   Not on file  Social History Narrative   Lives in [place]; with wife, Madeline. Retd from [employer]; quit in 1980; used to drink alcohol ; no beer in last 2022. No indoor pets.   Social Drivers of Corporate investment banker Strain: Low Risk  (08/06/2023)   Overall Financial Resource Strain (CARDIA)    Difficulty of Paying Living Expenses: Not hard at all  Food Insecurity: No Food Insecurity (08/06/2023)   Hunger Vital Sign    Worried About Running Out of Food in the Last Year: Never true    Ran Out of Food in the Last Year: Never true  Transportation Needs: No Transportation Needs (08/06/2023)    PRAPARE - Administrator, Civil Service (Medical): No    Lack of Transportation (Non-Medical):  No  Physical Activity: Inactive (08/06/2023)   Exercise Vital Sign    Days of Exercise per Week: 0 days    Minutes of Exercise per Session: Not on file  Stress: No Stress Concern Present (08/06/2023)   [employer] of Occupational Health - Occupational Stress Questionnaire    Feeling of Stress: Only a little  Social Connections: Socially Integrated (08/06/2023)   Social Connection and Isolation Panel    Frequency of Communication with Friends and Family: More than three times a week    Frequency of Social Gatherings with Friends and Family: More than three times a week    Attends Religious Services: More than 4 times per year    Active Member of [employer] or Organizations: Yes    Attends Engineer, structural: More than 4 times per year    Marital Status: Married  Catering manager Violence: Not At Risk (04/26/2022)   Humiliation, Afraid, Rape, and Kick questionnaire    Fear of Current or Ex-Partner: No    Emotionally Abused: No    Physically Abused: No    Sexually Abused: No   Family History  Problem Relation Age of Onset   Heart disease Father    Hypertension Father    Stroke Father    Cancer Father        prostate cancer   Cancer Sister    Arrhythmia Sister        A-fib   Arrhythmia Brother        A-fib   Cancer Brother    Current Outpatient Medications on File Prior to Visit  Medication Sig   amLODipine  (NORVASC ) 5 MG tablet Take 1 tablet (5 mg total) by mouth daily.   benazepril  (LOTENSIN ) 40 MG tablet TAKE ONE TABLET BY MOUTH ONE TIME DAILY   busPIRone  (BUSPAR ) 10 MG tablet TAKE ONE TABLET BY MOUTH FOUR TIMES A DAY AS NEEDED FOR ANIXETY   Calcium -Magnesium-Vitamin D  (CITRACAL CALCIUM  +D3 PO)    carboxymethylcellulose (REFRESH PLUS) 0.5 % SOLN Place 1-2 drops into both eyes 3 (three) times daily as needed (dry/irritated eyes.).   cholecalciferol (VITAMIN D3)  25 MCG (1000 UNIT) tablet Take 1,000 Units by mouth daily.   cyanocobalamin  (VITAMIN B12) 1000 MCG/ML injection INJECT 1ML INTO THE SKIN EVERY 30 DAYS AS DIRECTED (Patient taking differently: Inject 1,000 mcg into the skin every 30 (thirty) days.)   cyclobenzaprine  (FLEXERIL ) 10 MG tablet Take 1 tablet (10 mg total) by mouth at bedtime.   FERROUS BISGLYCINATE CHELATE PO Take 25 mg by mouth daily. Bronson Iron  Bisglycinate 25 mg Gentle on The Stomach Supports Energy & Healthy Red Blood Cell Production   fluticasone  (FLONASE ) 50 MCG/ACT nasal spray USE TWO SPRAYS IN EACH NOSTRIL ONE TIME DAILY   melatonin 3 MG TABS tablet Take 3 mg by mouth at bedtime.   Multiple Vitamin (MULTIVITAMIN WITH MINERALS) TABS tablet Take 1 tablet by mouth in the morning.   traZODone  (DESYREL ) 150 MG tablet TAKE ONE TABLET BY MOUTH AT BEDTIME   XARELTO  20 MG TABS tablet TAKE ONE TABLET BY MOUTH ONE TIME DAILY WITH SUPPER   No current facility-administered medications on file prior to visit.    Review of Systems  Constitutional:  Negative for activity change, appetite change, chills, diaphoresis, fatigue and fever.  HENT:  Negative for congestion and hearing loss.   Eyes:  Negative for visual disturbance.  Respiratory:  Negative for cough, chest tightness, shortness of breath and wheezing.   Cardiovascular:  Negative for chest pain,  palpitations and leg swelling.  Gastrointestinal:  Negative for abdominal pain, constipation, diarrhea, nausea and vomiting.  Genitourinary:  Negative for dysuria, frequency and hematuria.  Musculoskeletal:  Negative for arthralgias and neck pain.  Skin:  Negative for rash.  Neurological:  Negative for dizziness, weakness, light-headedness, numbness and headaches.  Hematological:  Negative for adenopathy.  Psychiatric/Behavioral:  Negative for behavioral problems, dysphoric mood and sleep disturbance.    Per HPI unless specifically indicated above     Objective:    BP 110/72 (BP  Location: Left Arm, Patient Position: Sitting, Cuff Size: Large)   Pulse (!) 101   Ht 6' 1 (1.854 m)   Wt (!) 353 lb 5 oz (160.3 kg)   SpO2 96%   BMI 46.61 kg/m   Wt Readings from Last 3 Encounters:  08/06/23 (!) 353 lb 5 oz (160.3 kg)  07/11/23 (!) 346 lb (156.9 kg)  06/27/23 (!) 353 lb (160.1 kg)    Physical Exam Vitals and nursing note reviewed.  Constitutional:      General: He is not in acute distress.    Appearance: He is well-developed. He is obese. He is not diaphoretic.     Comments: Well-appearing, comfortable, cooperative  HENT:     Head: Normocephalic and atraumatic.  Eyes:     General:        Right eye: No discharge.        Left eye: No discharge.     Conjunctiva/sclera: Conjunctivae normal.     Pupils: Pupils are equal, round, and reactive to light.  Neck:     Thyroid : No thyromegaly.     Vascular: No carotid bruit.  Cardiovascular:     Rate and Rhythm: Normal rate and regular rhythm.     Pulses: Normal pulses.     Heart sounds: Normal heart sounds. No murmur heard. Pulmonary:     Effort: Pulmonary effort is normal. No respiratory distress.     Breath sounds: Normal breath sounds. No wheezing or rales.  Abdominal:     General: Bowel sounds are normal. There is no distension.     Palpations: Abdomen is soft. There is no mass.     Tenderness: There is no abdominal tenderness.  Musculoskeletal:        General: No tenderness. Normal range of motion.     Cervical back: Normal range of motion and neck supple.     Right lower leg: No edema.     Left lower leg: No edema.     Comments: Upper / Lower Extremities: - Normal muscle tone, strength bilateral upper extremities 5/5, lower extremities 5/5  Lymphadenopathy:     Cervical: No cervical adenopathy.  Skin:    General: Skin is warm and dry.     Findings: No erythema or rash.  Neurological:     Mental Status: He is alert and oriented to person, place, and time.     Comments: Distal sensation intact to light  touch all extremities  Psychiatric:        Mood and Affect: Mood normal.        Behavior: Behavior normal.        Thought Content: Thought content normal.     Comments: Well groomed, good eye contact, normal speech and thoughts     Results for orders placed or performed in visit on 07/30/23  Lipid panel   Collection Time: 07/31/23  9:25 AM  Result Value Ref Range   Cholesterol 246 (H) <200 mg/dL   HDL 70 >  OR = 40 mg/dL   Triglycerides 884 <849 mg/dL   LDL Cholesterol (Calc) 153 (H) mg/dL (calc)   Total CHOL/HDL Ratio 3.5 <5.0 (calc)   Non-HDL Cholesterol (Calc) 176 (H) <130 mg/dL (calc)  Hemoglobin J8r   Collection Time: 07/31/23  9:25 AM  Result Value Ref Range   Hgb A1c MFr Bld 5.7 (H) <5.7 %   Mean Plasma Glucose 117 mg/dL   eAG (mmol/L) 6.5 mmol/L  CBC with Differential/Platelet   Collection Time: 07/31/23  9:25 AM  Result Value Ref Range   WBC 6.3 3.8 - 10.8 Thousand/uL   RBC 5.10 4.20 - 5.80 Million/uL   Hemoglobin 14.6 13.2 - 17.1 g/dL   HCT 55.6 61.4 - 49.9 %   MCV 86.9 80.0 - 100.0 fL   MCH 28.6 27.0 - 33.0 pg   MCHC 33.0 32.0 - 36.0 g/dL   RDW 86.0 88.9 - 84.9 %   Platelets 193 140 - 400 Thousand/uL   MPV 9.1 7.5 - 12.5 fL   Neutro Abs 3,925 1,500 - 7,800 cells/uL   Absolute Lymphocytes 1,682 850 - 3,900 cells/uL   Absolute Monocytes 605 200 - 950 cells/uL   Eosinophils Absolute 69 15 - 500 cells/uL   Basophils Absolute 19 0 - 200 cells/uL   Neutrophils Relative % 62.3 %   Total Lymphocyte 26.7 %   Monocytes Relative 9.6 %   Eosinophils Relative 1.1 %   Basophils Relative 0.3 %  Comprehensive metabolic panel with GFR   Collection Time: 07/31/23  9:25 AM  Result Value Ref Range   Glucose, Bld 97 65 - 99 mg/dL   BUN 16 7 - 25 mg/dL   Creat 9.09 9.29 - 8.64 mg/dL   eGFR 92 > OR = 60 fO/fpw/8.26f7   BUN/Creatinine Ratio SEE NOTE: 6 - 22 (calc)   Sodium 141 135 - 146 mmol/L   Potassium 4.6 3.5 - 5.3 mmol/L   Chloride 107 98 - 110 mmol/L   CO2 27 20 -  32 mmol/L   Calcium  9.0 8.6 - 10.3 mg/dL   Total Protein 6.4 6.1 - 8.1 g/dL   Albumin 4.3 3.6 - 5.1 g/dL   Globulin 2.1 1.9 - 3.7 g/dL (calc)   AG Ratio 2.0 1.0 - 2.5 (calc)   Total Bilirubin 0.5 0.2 - 1.2 mg/dL   Alkaline phosphatase (APISO) 74 35 - 144 U/L   AST 14 10 - 35 U/L   ALT 11 9 - 46 U/L  PSA   Collection Time: 07/31/23  9:25 AM  Result Value Ref Range   PSA 0.05 < OR = 4.00 ng/mL  TSH   Collection Time: 07/31/23  9:25 AM  Result Value Ref Range   TSH 1.39 0.40 - 4.50 mIU/L   *Note: Due to a large number of results and/or encounters for the requested time period, some results have not been displayed. A complete set of results can be found in Results Review.      Assessment & Plan:   Problem List Items Addressed This Visit     Essential hypertension   Relevant Medications   atorvastatin  (LIPITOR) 10 MG tablet   furosemide  (LASIX ) 20 MG tablet   Mixed hyperlipidemia   Relevant Medications   atorvastatin  (LIPITOR) 10 MG tablet   furosemide  (LASIX ) 20 MG tablet   Other Visit Diagnoses       Annual physical exam    -  Primary     Need for diphtheria-tetanus-pertussis (Tdap) vaccine  Relevant Orders   Tdap vaccine greater than or equal to 7yo IM (Completed)     Elevated hemoglobin A1c         Lower extremity edema       Relevant Medications   furosemide  (LASIX ) 20 MG tablet     Anxiety       Relevant Medications   DULoxetine  (CYMBALTA ) 60 MG capsule        Updated Health Maintenance information Reviewed recent lab results with patient Encouraged improvement to lifestyle with diet and exercise Goal of weight loss   Lower Ext Edema / Fluid Retention Significant fluid retention, especially in knees, possibly related to bursitis. - Prescribe furosemide  20 mg as needed for fluid retention.  Hyperlipidemia LDL cholesterol elevated at 153 mg/dL. Previous statin therapy caused joint pain. Discussed Repatha  but insurance is a barrier. Willing to retry  atorvastatin  at lower dose. - Prescribe atorvastatin  10 mg every other night. - Monitor for side effects and efficacy. - Consider pravastatin if atorvastatin  is not tolerated.  Iron  Deficiency Anemia Followed by [facility] Undergoing monthly iron  infusions with improvement. - Continue monthly iron  infusions as scheduled.  Weight Management Desires to lose 50 pounds. Discussed medications but cost and insurance are barriers. - Consider weight management options if insurance coverage improves.  General Health Maintenance Annual wellness visit completed. Tetanus vaccination due. Discussed pneumonia and shingles vaccinations. Effective prostate cancer surveillance post-brachytherapy. Normal thyroid  function, blood counts, kidney, liver function, and electrolytes. - Administer tetanus vaccine today. - Consider pneumonia vaccine in the future. - Continue prostate cancer surveillance with annual PSA testing.  Follow-up - Schedule follow-up appointment in six months, preferably in the afternoon.         Orders Placed This Encounter  Procedures   Tdap vaccine greater than or equal to 7yo IM    Meds ordered this encounter  Medications   atorvastatin  (LIPITOR) 10 MG tablet    Sig: Take 1 tablet (10 mg total) by mouth every other day.    Dispense:  45 tablet    Refill:  3   furosemide  (LASIX ) 20 MG tablet    Sig: Take 1 tablet (20 mg total) by mouth daily as needed for edema.    Dispense:  90 tablet    Refill:  3   DULoxetine  (CYMBALTA ) 60 MG capsule    Sig: Take 1 capsule (60 mg total) by mouth daily.    Dispense:  90 capsule    Refill:  1     Follow up plan: Return for 6 month PreDM A1c.  Marsa Officer, DO [facility] 08/06/2023, 1:29 PM

## 2023-08-06 NOTE — Patient Instructions (Addendum)
 Thank you for coming to the office today.  Future consideration Prevnar-20  Today Tdap  Zepbound  TO Check Cost & Coverage of Zepbound  Please contact Research officer, trade union (manufacturer for Zepbound ) 1-800-[employer] [phone]) - Live agent to discuss cost and coverage.   Tzhncb Verified [email or web address]     For Weight Loss / Obesity only   Contrave - oral medication, appetite suppression has wellbutrin/bupropion and naltrexone in it and it can also help with appetite, it is ordered through a speciality pharmacy. - $99 per month   Try 1 week sample Contrave - Week 1: 1 pill daily with meal - Week 2: 1 pill twice a day with meal - Week 3: 2 pill with meal in AM and 1 pill with PM meal - Week 4: 2 pill twice a day with meal   Please notify me after 1 week, and if doing well on Contrave, I can order to [facility] pharmacy $99 per month plan with this med.   Free sample 7 day, 1 pill per day for 1 week   Future consideration Coronary Calcium  Score Cardiac CT Scan. This is a screening test for patients aged 16-50+ with cardiovascular risk factors or who are healthy but would be interested in Cardiovascular Screening for heart disease. Even if there is a family history of heart disease, this imaging can be useful. Typically it can be done every 5+ years or at a different timeline we agree on  The scan will look at the chest and mainly focus on the heart and identify early signs of calcium  build up or blockages within the heart arteries. It is not 100% accurate for identifying blockages or heart disease, but it is useful to help us  predict who may have some early changes or be at risk in the future for a heart attack or cardiovascular problem.  The results are reviewed by a Cardiologist and they will document the results. It should become available on MyChart. Typically the results are divided into percentiles based on  other patients of the same demographic and age. So it will compare your risk to others similar to you. If you have a higher score >99 or higher percentile >75%tile, it is recommended to consider Statin cholesterol therapy and or referral to Cardiologist. I will try to help explain your results and if we have questions we can contact the Cardiologist.  You will be contacted for scheduling. Usually it is done at any imaging facility through [facility], [facility] or [facility].  The cost is $99 flat fee total and it does not go through insurance, so no authorization is required.    Please schedule a Follow-up Appointment to: Return for 6 month PreDM A1c.  If you have any other questions or concerns, please feel free to call the office or send a message through MyChart. You may also schedule an earlier appointment if necessary.  Additionally, you may be receiving a survey about your experience at our office within a few days to 1 week by e-mail or mail. We value your feedback.  Marsa Officer, DO [facility], [place]

## 2023-08-07 DIAGNOSIS — G510 Bell's palsy: Secondary | ICD-10-CM | POA: Diagnosis not present

## 2023-08-07 DIAGNOSIS — H2513 Age-related nuclear cataract, bilateral: Secondary | ICD-10-CM | POA: Diagnosis not present

## 2023-08-09 ENCOUNTER — Ambulatory Visit

## 2023-08-09 DIAGNOSIS — G8929 Other chronic pain: Secondary | ICD-10-CM

## 2023-08-09 DIAGNOSIS — M6281 Muscle weakness (generalized): Secondary | ICD-10-CM | POA: Diagnosis not present

## 2023-08-09 DIAGNOSIS — M25512 Pain in left shoulder: Secondary | ICD-10-CM | POA: Diagnosis not present

## 2023-08-09 NOTE — Therapy (Signed)
 OUTPATIENT PHYSICAL THERAPY SHOULDER TREATMENT  Patient Name: [name] MRN: [id number] DOB:1955/03/14, 68 y.o., male Today's Date: 08/09/2023   PT End of Session - 08/09/23 1402     Visit Number 16    Number of Visits 25    Date for PT Re-Evaluation 08/20/23    Authorization Type eval: 02/26/23, [employer] AUTH Approved [id number] 3 visits between 07/09/23-08/20/23  [facility] Medicare 2025, Copay: $20  OOP:$3900/$0 used  [name] is req  Ref #:[id number]    Authorization - Visit Number 3    Authorization - Number of Visits 3    PT Start Time 1405    PT Stop Time 1445    PT Time Calculation (min) 40 min    Activity Tolerance Patient tolerated treatment well    Behavior During Therapy WFL for tasks assessed/performed         Past Medical History:  Diagnosis Date   (HFpEF) heart failure with preserved ejection fraction (HCC)    a.) TTE 12/26/2013: EF 45-50%, mild ant and antsept HK. mild MR. Mod dil LA. nl RV fxn. Rhythm was Afib; b.) TTE 07/04/2019: EF 55%, mid-apical anteroseptal HK, mild MAC, mild Ao sclerosis, G2DD, RVSP 45.3; c.) TTE 06/02/2022: EF 55-60%, no RWMAs, sev LAE, RVSP 37.9, mild-mod MR, mild AR, AoV sclerosis without stenosis, asc Ao 38 mm   Anxiety    Aortic atherosclerosis (HCC)    Cardiomyopathy (in setting of Afib)    a.) TTE 12/26/2013: EF 45-50%; b.) TTE 07/04/2019: EF 55%; c.) TTE 06/02/2022: EF 55-60%   Chronic pain syndrome    a.) followed by pain management   Chronic, continuous use of opioids    a.) on COT; followed by pain management   Coronary artery disease 11/06/2014   a.) cCTA 11/06/2014: Ca2+ = 224 (74th %ile; LAD distribution)   Diverticulosis    DJD (degenerative joint disease) of knee    Former smoker    History of hiatal hernia    History of kidney stones 2012   Hyperlipidemia    Hyperplastic colon polyp    Hypertension    Inguinal hernia, left    Internal hemorrhoids    Intervertebral disc disorder with radiculopathy of lumbosacral region    Lesion of  bone of lumbosacral spine    L5   Long term current use of amiodarone     Lower extremity edema    Mixed hyperlipidemia    Morbid obesity (HCC)    Multiple falls    Myalgia due to statin    On rivaroxaban  therapy    Osteoarthritis    PAF (paroxysmal atrial fibrillation) (HCC)    a.) CHA2DS2VASc = 4 (age, HTN, CHF, vascular disease) as of 02/19/23;  b.) s/p DCCV 03/13/14 (200 J), 05/15/19 (150 J x 1, 200 J x2), 05/19/2022 (150 J x1, 200 J x2; pads changed to ant/lat postion with additional 200 J x 2 -> did not convert), 07/25/22 (200 J), 09/21/2022 (200 J); c.) s/p PVI ablation 11/17/2022; d.) rate/rhythm maintained on amiodarone ; chronically anticoagulated on rivaroxaban    Pernicious anemia    Prostate cancer (HCC)    Pulmonary nodule, right    a. 10/2014 Cardiac CTA: 7mm RLL nodule; b. 04/2015 CT Chest: stable 7mm RLL nodule. No new nodules; 02/2017 CTA Chest: stable, benign, 7mm RLL pulm nodule.   Rotator cuff tear, left    Sleep difficulties    a.) takes melatonin + trazodone  PRN   SVT (supraventricular tachycardia) (HCC)    Tubular adenoma of colon  Past Surgical History:  Procedure Laterality Date   ATRIAL FIBRILLATION ABLATION N/A 11/17/2022   Procedure: ATRIAL FIBRILLATION ABLATION; Location: [facility]; Surgeon: Gehi, Anil, MD   BICEPT TENODESIS Right 09/27/2021   Procedure: Right reverse shoulder arthroplasty, biceps tenodesis;  Surgeon: Tobie Priest, MD;  Location: [facility];  Service: Orthopedics;  Laterality: Right;   CARDIOVERSION N/A 05/15/2019   Procedure: CARDIOVERSION;  Surgeon: Perla Evalene PARAS, MD;  Location: [facility];  Service: Cardiovascular;  Laterality: N/A;   CARDIOVERSION N/A 03/13/2014   Procedure: CARDIOVERSION; Location: [facility]; Surgeon: Evalene Perla, MD   CARDIOVERSION N/A 03/24/2022   Procedure: CARDIOVERSION;  Surgeon: Perla Evalene PARAS, MD;  Location: [facility];  Service: Cardiovascular;  Laterality: N/A;   CARDIOVERSION N/A 05/19/2022   Procedure:  CARDIOVERSION;  Surgeon: Perla Evalene PARAS, MD;  Location: [facility];  Service: Cardiovascular;  Laterality: N/A;   CARDIOVERSION N/A 07/25/2022   Procedure: CARDIOVERSION;  Surgeon: Fernande Elspeth BROCKS, MD;  Location: [facility];  Service: Cardiovascular;  Laterality: N/A;   CARDIOVERSION N/A 09/21/2022   Procedure: CARDIOVERSION;  Surgeon: Fernande Elspeth BROCKS, MD;  Location: [facility];  Service: Cardiovascular;  Laterality: N/A;   CARPAL TUNNEL RELEASE Right 12/23/2014   Procedure: CARPAL TUNNEL RELEASE;  Surgeon: Kayla Pinal, MD;  Location: [facility];  Service: Orthopedics;  Laterality: Right;   CARPAL TUNNEL RELEASE Left 01/06/2015   Procedure: CARPAL TUNNEL RELEASE;  Surgeon: Kayla Pinal, MD;  Location: [facility];  Service: Orthopedics;  Laterality: Left;   COLONOSCOPY WITH PROPOFOL  N/A 10/08/2017   Procedure: COLONOSCOPY WITH PROPOFOL ;  Surgeon: Unk Corinn Skiff, MD;  Location: [facility];  Service: Gastroenterology;  Laterality: N/A;   COLONOSCOPY WITH PROPOFOL  N/A 06/07/2022   Procedure: COLONOSCOPY WITH PROPOFOL ;  Surgeon: Unk Corinn Skiff, MD;  Location: [facility];  Service: Gastroenterology;  Laterality: N/A;   ENTEROSCOPY  06/07/2022   Procedure: ENTEROSCOPY;  Surgeon: Unk Corinn Skiff, MD;  Location: [facility];  Service: Gastroenterology;;   ESOPHAGOGASTRODUODENOSCOPY (EGD) WITH PROPOFOL  N/A 06/07/2022   Procedure: ESOPHAGOGASTRODUODENOSCOPY (EGD) WITH PROPOFOL ;  Surgeon: Unk Corinn Skiff, MD;  Location: [facility];  Service: Gastroenterology;  Laterality: N/A;   GASTRIC BYPASS  01/17/2000   KNEE SURGERY Right    knee trauma x3   prostate seeding     REVERSE SHOULDER ARTHROPLASTY Right 09/27/2021   Procedure: Right reverse shoulder arthroplasty, biceps tenodesis;  Surgeon: Tobie Priest, MD;  Location: [facility];  Service: Orthopedics;  Laterality: Right;   REVERSE SHOULDER ARTHROPLASTY Left 02/20/2023   Procedure: Left reverse shoulder arthroplasty, biceps  tenodesis;  Surgeon: Tobie Priest, MD;  Location: [facility];  Service: Orthopedics;  Laterality: Left;   SHOULDER ARTHROSCOPY WITH ROTATOR CUFF REPAIR AND OPEN BICEPS TENODESIS Left 12/27/2021   Procedure: Left shoulder arthroscopic cuff repair (supraspinatus and subscapularis) with Regeneten Patch application;  Surgeon: Tobie Priest, MD;  Location: [facility];  Service: Orthopedics;  Laterality: Left;   Patient Active Problem List   Diagnosis Date Noted   Symptomatic anemia 04/26/2022   Chronic pain of both knees 03/30/2022   Bilateral primary osteoarthritis of knee 03/30/2022   Shortness of breath 03/24/2022   Vitamin D  deficiency 02/24/2022   S/p reverse total shoulder arthroplasty 09/27/2021   Lesion of bone of lumbosacral spine (L5) 05/12/2021   Localized osteoarthritis of shoulder regions, bilateral 03/29/2021   Chronic pain of both shoulders 03/29/2021   Drug-induced myopathy 02/21/2021   Trigger finger of right hand 11/15/2020   Prostate cancer (HCC) 07/26/2020   Insomnia 08/01/2019   Primary osteoarthritis  of both wrists 02/17/2019   Trigger middle finger of left hand 02/17/2019   Chronic pain syndrome 02/17/2019   Chronic radicular lumbar pain 10/08/2018   Lumbar radiculopathy 10/08/2018   Lumbar degenerative disc disease 10/08/2018   Lumbar facet arthropathy 10/08/2018   Lumbar facet joint syndrome 10/08/2018   Intervertebral disc disorder with radiculopathy of lumbosacral region    Osteoarthritis of knee 11/30/2017   Osteoarthritis of wrist 11/30/2017   Pernicious anemia 05/22/2016   Persistent atrial fibrillation (HCC) 12/20/2015   Carpal tunnel syndrome 11/16/2014   DJD (degenerative joint disease) of knee 10/27/2014   Bilateral carpal tunnel syndrome 10/27/2014   Morbid obesity with BMI of 45.0-49.9, adult (HCC) 10/27/2014   Mixed hyperlipidemia 10/27/2014   H/O gastric bypass 03/20/2014   Hyperkalemia 03/20/2014   History of prostate cancer 12/26/2013   Essential  hypertension 12/26/2013   Encounter for anticoagulation discussion and counseling 12/26/2013   PCP: Dr. Edman  REFERRING PROVIDER: Dr. Earnestine Blanch  REFERRING DIAG: [phone] (ICD-10-CM) - Complete rotator cuff tear or rupture of left shoulder, not specified as traumatic   RATIONALE FOR EVALUATION AND TREATMENT: Rehabilitation  THERAPY DIAG: Chronic left shoulder pain  Muscle weakness (generalized)  ONSET DATE: 02/20/23  FOLLOW-UP APPT SCHEDULED WITH REFERRING PROVIDER: Yes   FROM INITIAL EVALUATION SUBJECTIVE:                                                                                                                                                                                         SUBJECTIVE STATEMENT:  L reverse TSR 02/20/23  PERTINENT HISTORY:  BELA NYBORG is a 68 y.o. male who initially underwent arthroscopic subscapularis repair and side-to-side supraspinatus repair with Regeneten patch application by Dr. Blanch 12/27/2021. He underwent post-surgical physical therapy with initial minimal improvement followed by worsening shoulder strength/function. Repeat MRI showed large, retracted subscapularis re-tear and supraspinatus re-tear with signs of rotator cuff arthropathy. Conservative measures including medications, physical therapy, and cortisone injections did not provide adequate relief. The patient elected to proceed with reverse shoulder arthroplasty and biceps tenodesis on 02/20/23. Intraoperative findings include retracted full-thickness subscapularis tear, partial-thickness supraspinatus tear, and biceps tendinopathy. Surgery was a standard deltopectoral incision under general anesthesia along with an interscalene block. Per op note, operative arm to remain in sling at all times except ROM exercises and hygiene. Can perform pendulums, elbow/wrist/hand ROM exercises. Passive ROM allowed to 90 FF and 30 ER. ASA 325mg  x 6 weeks for DVT ppx (pt states that he was actually  instructed to start back on his Xarelto  and not to use ASA). Plan for PT starting on POD #3-4. Pt denies any post-operative complications  from surgery. Pt has a history of chronic back pain and has undergone multiple lumbar facet, medial branch radiofrequency ablations. He also has a past history of cardiomyopathy (in setting of Afib), DJD (degenerative joint disease) of knee, kidney stones, lower extremity edema, PAF (paroxysmal atrial fibrillation), pernicious anemia, prostate cancer, and a pulmonary nodule. He also has a past surgical history that includes R reverse TSR, knee surgery, gastric bypass (2002), prostate seeding, carpal tunnel release (Right, 12/23/2014; Left, 01/06/2015), and multiple cardioversions.   PAIN:  Pain Intensity: No resting pain upon arrival Pain location: L shoulder Pain Quality: Nerve pain Radiating: No  Numbness/Tingling: No Focal Weakness: Yes 24-hour pain behavior: Varies History of prior shoulder or neck/shoulder injury, pain, surgery, or therapy: Yes PT for L shoulder s/p RTC repair, history of R reverse TSR; Dominant hand: right Red flags Positive: history of prostate CA, Negative: chills/fever, night sweats, nausea, vomiting, unrelenting pain, unexplained weight gain/loss  PRECAUTIONS: Shoulder, per operative note: operative arm to remain in sling at all times except ROM exercises and hygiene. Can perform pendulums, elbow/wrist/hand ROM exercises. Passive ROM allowed to 90 FF and 30 ER.   General reverse TSR precautions include no combined shoulder extension, IR, and adduction. No shoulder and elbow AROM (bicep tenodesis) for at least the first 4 weeks   WEIGHT BEARING RESTRICTIONS: Yes no WB through LUE  FALLS: Has patient fallen in last 6 months? 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  Recent memory is intact.  Remote memory is intact.  Attention span and concentration are intact.  Expressive speech is intact.  Patient's fund of knowledge is within normal limits for educational level.    Gross Musculoskeletal Assessment Tremor: None Bulk: Normal Tone: Normal Incision is clean and dry with minimal blood staining on honeycomb dressing as well as minimal swelling around the incision. Skin is cool to touch and no signs of infection present.   Gait Deferred full gait assessment  Posture Forward head  with upper thoracic kyphosis and rounded shoulders  Cervical Screen Deferred  ROM ROM (Normal range in degrees)    Right (AROM) Left (PROM)  Shoulder    Flexion 160 90 (limited per protocol)  Extension    Abduction 180 90 (scaption)  External Rotation 88 0  Internal Rotation 70 stomach  Hands Behind Head    Hands Behind Back        Elbow    Flexion Full Full  Extension 0 0  Pronation WNL Full palm up  Supination WNL Full palm down  (* = pain; Blank rows = not tested)  UE MMT: MMT (out of 5) Right Left   Shoulder   Flexion 5   Extension    Abduction 5   External rotation 5   Internal rotation 5   Horizontal abduction    Horizontal adduction    Lower Trapezius    Rhomboids        Elbow  Flexion 5   Extension 5   Pronation 5   Supination 5       Wrist  Flexion 5 5  Extension 5 5  Radial deviation  5  Ulnar deviation  5      MCP  Flexion 5 5  Extension 5 5   Abduction 5 5  Adduction 5 5  (* = pain; Blank rows = not tested)  AROM AROM     Right Left  Shoulder    Flexion 180 160  Extension    Abduction 152 138  External Rotation 90 88  Internal Rotation 70 74  Hands Behind Head C6 C3  Hands Behind Back L1 Lateral waistline  (* = pain; Blank rows = not tested)  07/31/23 UE MMT: MMT (out of 5) Right Left   Shoulder   Flexion 5 4*  Abduction 5 4*  External rotation (seated) 5 4*  Internal rotation (seated) 4+ 4+*  Horizontal abduction Unable to lay prone Unable to lay prone  Horizontal adduction    Lower Trapezius    Rhomboids        Elbow  Flexion 5 5  Extension 5 5  Pronation 5 5  Supination 5 5*   Shoulder Flexion  Peak Force (lb) Right 21.09 lb Left 10.19 lb Strength Difference 10.90 lb* Percentage Difference 69.66%*  Shoulder Abduction Peak Force (lb) Right 14.57 lb Left 8.33 lb Strength Difference 6.24 lb* Percentage Difference 54.48%*  Shoulder External Rotation Peak Force (lb) Right 20.28 lb Left 16.90 lb Strength Difference 3.37 lb* Percentage Difference 18.15%*  Shoulder Internal Rotation Peak Force (lb) Right 19.77 lb Left 20.43 lb Strength Difference 0.66 lb Percentage Difference 3.28%    TODAY'S TREATMENT: 08/09/23   Subjective: Patient reports ongoing constant L shoulder pain. It is particularly sore upon arrival today. He has an upcoming appt with his surgeon Dr. Tobie and is wondering whether he needs additional imaging for his L shoulder. He also has a follow-up scheduled with Dr. Marcelino for pain management related to his wrists and low back. No additional episodes of L shoulder instability/subluxation.    PAIN: Burning/ache    Ther-ex  UBE x 4 minutes (2 minutes forward/2 minutes backward) for BUE ROM, strength, and warm-up during interval history (2 minutes unbilled); Seated pulleys for L shoulder AAROM overhead flexion and abduction x multiple bouts each direction;  In  semi-recumbent position: L shoulder flexion with 3# dumbbell x 8, x 10; L shoulder scaption, un weighted 2 x 10; L shoulder overhead press with 4# DB x 10; L shoulder manually resisted ER 2 x 10; L shoulder manually resisted IR 2 x 10; L elbow manually resisted flexion/extension 2 x 10 each; Dowel chest press with manual resistance from therapist x 8, x 10; Seated dowel rows with manual resistance from therapist 2 x 10;    HOME EXERCISE PROGRAM:  Access Code: [email or web address] URL: [email or web address] Date: 07/31/2023 Prepared by: Selinda Eck  Exercises - Seated Cervical Sidebending Stretch (Mirrored)  - 2 x daily - 7 x weekly - 3 reps - 30s hold - Seated Scapular Retraction  - 2 x daily - 7 x weekly - 2 sets - 10 reps - 3s hold - Standing Shoulder Flexion Full Range Single Arm  - 1 x daily - 7 x weekly - 2 sets - 10 reps - 3s hold - Standing Shoulder Abduction Full Range Single Arm  - 1 x daily - 7 x weekly - 2 sets - 10 reps - 3s  hold - Wrist Flexion AROM  - 2 x daily - 7 x weekly - 2 sets - 10 reps - 3s hold - Wrist Extension AROM  - 1 x daily - 7 x weekly - 2 sets - 10 reps - 3s hold - Shoulder External Rotation and Scapular Retraction with Resistance  - 1 x daily - 7 x weekly - 2 sets - 10 reps - 3s hold    ASSESSMENT: Regressed L shoulder strengthening during session today due to ongoing pain. Dropped dumbbell weight to 3# and performed scaption without weight due to significant weakness and pain. He also reports some pain with chest press during session today. He has a follow-up scheduled with orthopedic surgeon in the next 2 weeks. Mr. Moring would benefit from ongoing PT to prevent additional decline, regain L shoulder strength, and decrease pain. Pt encouraged to continue HEP and follow up as directed. He will continue to benefit from skilled therapy to address remaining deficits in L shoulder weakness and pain order to improve quality of life and return to PLOF.      OBJECTIVE IMPAIRMENTS: decreased ROM, decreased strength, and pain.   ACTIVITY LIMITATIONS: carrying, lifting, bathing, toileting, dressing, and reach over head  PARTICIPATION LIMITATIONS: meal prep, cleaning, laundry, driving, shopping, community activity, and yard work  PERSONAL FACTORS: Past/current experiences, Time since onset of injury/illness/exacerbation, and 3+ comorbidities: OA, anemia, anxiety, cardiomyopathy, chronic pain are also affecting patient's functional outcome.   REHAB POTENTIAL: Good  CLINICAL DECISION MAKING: Unstable/unpredictable  EVALUATION COMPLEXITY: High   GOALS: Goals reviewed with patient? Yes  SHORT TERM GOALS: Target date: 07/09/2023   Pt will be independent with HEP to improve strength and decrease shoulder pain to improve pain-free function at home and work. Baseline: Issued and progressing Goal status: ONGOING   LONG TERM GOALS: Target date: 08/20/2023  1.  Pt will report no further L shoulder pain with all AROM in order to complete all ADLs and improve pain-free function. Baseline: 05/28/23: worst pain 7/10; 07/31/23: 10/10 Goal status: WORSENING  2.  Pt will decrease QuickDASH score to below 30% in order to demonstrate clinically significant reduction in disability related to shoulder pain        Baseline: 02/26/23: 72.7%; 05/28/23: 40.9%; 07/31/23: 56.8% Goal status: PARTIALLY MET  3. Pt will increase strength of L shoulder flexion and scaption to at least 4/5 in order to demonstrate improvement in strength and function         Baseline: 02/26/23: Unable to test; 05/28/23: flexion: 4+/5, pain free, scaption: 4/5, pain; 07/31/23: see note above, painful Goal status: PARTIALLY MET  4. Pt will improve L shoulder AROM flexion and scaption to at least 110 degrees in order to demonstrate improvement in function so he can take care of his property and complete all household responsibilities;        Baseline: 02/26/23: Unable to test (90 degrees PROM for  both), 05/28/23: L shoulder AROM against gravity: flexion: 176, no pain, scaption: 130, pain/burning; 07/31/23: see note Goal status: ACHIEVED   PLAN: PT FREQUENCY: 1-2x/week  PT DURATION: 12 weeks  PLANNED INTERVENTIONS: Therapeutic exercises, Therapeutic activity, Neuromuscular re-education, Balance training, Gait training, Patient/Family education, Self Care, Joint mobilization, Joint manipulation, Vestibular training, Canalith repositioning, Orthotic/Fit training, DME instructions, Dry Needling, Electrical stimulation, Spinal manipulation, Spinal mobilization, Cryotherapy, Moist heat, Taping, Traction, Ultrasound, Ionotophoresis 4mg /ml Dexamethasone , Manual therapy, and Re-evaluation.  PLAN FOR NEXT SESSION: progress HEP, progress L shoulder ROM and strength per surgical protocol;  Selinda BIRCH Alara Daniel  PT, DPT, GCS  2:57 PM,08/09/23

## 2023-08-20 ENCOUNTER — Encounter: Payer: Self-pay | Admitting: Student in an Organized Health Care Education/Training Program

## 2023-08-20 ENCOUNTER — Ambulatory Visit: Admitting: Nurse Practitioner

## 2023-08-20 ENCOUNTER — Ambulatory Visit
Attending: Student in an Organized Health Care Education/Training Program | Admitting: Student in an Organized Health Care Education/Training Program

## 2023-08-20 VITALS — BP 126/87 | HR 84 | Temp 97.6°F | Resp 16 | Ht 72.0 in | Wt 350.0 lb

## 2023-08-20 DIAGNOSIS — M75122 Complete rotator cuff tear or rupture of left shoulder, not specified as traumatic: Secondary | ICD-10-CM | POA: Diagnosis not present

## 2023-08-20 DIAGNOSIS — G8929 Other chronic pain: Secondary | ICD-10-CM | POA: Insufficient documentation

## 2023-08-20 DIAGNOSIS — M5416 Radiculopathy, lumbar region: Secondary | ICD-10-CM | POA: Insufficient documentation

## 2023-08-20 DIAGNOSIS — M19032 Primary osteoarthritis, left wrist: Secondary | ICD-10-CM | POA: Insufficient documentation

## 2023-08-20 DIAGNOSIS — M47816 Spondylosis without myelopathy or radiculopathy, lumbar region: Secondary | ICD-10-CM | POA: Diagnosis not present

## 2023-08-20 DIAGNOSIS — M19031 Primary osteoarthritis, right wrist: Secondary | ICD-10-CM | POA: Diagnosis not present

## 2023-08-20 DIAGNOSIS — M4726 Other spondylosis with radiculopathy, lumbar region: Secondary | ICD-10-CM | POA: Diagnosis not present

## 2023-08-20 DIAGNOSIS — G894 Chronic pain syndrome: Secondary | ICD-10-CM | POA: Insufficient documentation

## 2023-08-20 MED ORDER — HYDROCODONE-ACETAMINOPHEN 7.5-325 MG PO TABS: 1.0000 | ORAL_TABLET | Freq: Four times a day (QID) | ORAL | 0 refills | Status: DC | PRN

## 2023-08-20 MED ORDER — HYDROCODONE-ACETAMINOPHEN 7.5-325 MG PO TABS: 1.0000 | ORAL_TABLET | Freq: Four times a day (QID) | ORAL | 0 refills | Status: AC | PRN

## 2023-08-20 NOTE — Progress Notes (Signed)
 Safety precautions to be maintained throughout the outpatient stay will include: orient to surroundings, keep bed in low position, maintain call bell within reach at all times, provide assistance with transfer out of bed and ambulation.

## 2023-08-20 NOTE — Progress Notes (Signed)
 PROVIDER NOTE: Interpretation of information contained herein should be left to medically-trained personnel. Specific patient instructions are provided elsewhere under Patient Instructions section of medical record. This document was created in part using AI and STT-dictation technology, any transcriptional errors that may result from this process are unintentional.  Patient: [name]  Service: E/M   PCP: Edman Marsa JINNY, DO  DOB: 01/03/1956  DOS: 08/20/2023  Provider: Wallie Sherry, MD  MRN: [id number]  Delivery: Face-to-face  Specialty: Interventional Pain Management  Type: Established Patient  Setting: Ambulatory outpatient facility  Specialty designation: 09  Referring Prov.: Edman Marsa *  Location: Outpatient office facility       History of present illness (HPI) [name], a 68 y.o. year old male, is here today because of his Chronic pain syndrome [G89.4]. [name] primary complain today is Knee Pain (Bilateral, right is worse ) and Back Pain (Lumbar right, very low )  Pertinent problems: [name] has H/O gastric bypass; DJD (degenerative joint disease) of knee; Bilateral carpal tunnel syndrome; Persistent atrial fibrillation (HCC); Chronic radicular lumbar pain; Lumbar radiculopathy; Lumbar degenerative disc disease; Lumbar facet arthropathy; Lumbar facet joint syndrome; Primary osteoarthritis of both wrists; Trigger middle finger of left hand; and Chronic pain syndrome on their pertinent problem list.  Pain Assessment: Severity of Chronic pain is reported as a 3 /10. Location: Knee Right, Left/back pain into top of buttocks. Onset: More than a month ago. Quality: Discomfort, Other (Comment) (intense). Timing: Intermittent. Modifying factor(s): rest, or getting off his feet.  medications. Vitals:  height is 6' (1.829 m) and weight is 350 lb (158.8 kg) (abnormal). His temporal temperature is 97.6 F (36.4 C). His blood pressure is 126/87 and his pulse is 84.  His respiration is 16 and oxygen saturation is 96%.  BMI: Estimated body mass index is 47.47 kg/m as calculated from the following:   Height as of this encounter: 6' (1.829 m).   Weight as of this encounter: 350 lb (158.8 kg).  Last encounter: 05/21/2023. Last procedure: 05/30/2023.  Reason for encounter:   Patient is here for medication management as well as evaluation of his right knee pain.  He states that about 3 weeks ago his knee was really swollen and he felt that he had a knee effusion.  He made an appointment at that time for a knee arthrocentesis.  He states that the swelling has considerably improved.  Upon knee arthrocentesis, no fluid returned.  Medication management as below, no change in dose or quantity.  Pharmacotherapy Assessment   hydrocodone  7.5 mg every 6 hours as needed, quantity 120/month; MME equals 30    Monitoring: [place] PMP: PDMP reviewed during this encounter.       Pharmacotherapy: No side-effects or adverse reactions reported. Compliance: No problems identified. Effectiveness: Clinically acceptable.  Jakie Chrissie MATSU, RN  08/20/2023  1:37 PM  Sign when Signing Visit Nursing Pain Medication Assessment:  Safety precautions to be maintained throughout the outpatient stay will include: orient to surroundings, keep bed in low position, maintain call bell within reach at all times, provide assistance with transfer out of bed and ambulation.  Medication Inspection Compliance: Pill count conducted under aseptic conditions, in front of the patient. Neither the pills nor the bottle was removed from the patient's sight at any time. Once count was completed pills were immediately returned to the patient in their original bottle.  Medication: Hydrocodone /APAP Pill/Patch Count: 3 of 120 pills/patches remain Pill/Patch Appearance: Markings consistent with prescribed medication  Bottle Appearance: Standard pharmacy container. Clearly labeled. Filled Date: 07 / 02 / 2025 Last  Medication intake:  Today  Jakie Chrissie MATSU, RN  08/20/2023  1:29 PM  Sign when Signing Visit Safety precautions to be maintained throughout the outpatient stay will include: orient to surroundings, keep bed in low position, maintain call bell within reach at all times, provide assistance with transfer out of bed and ambulation.   UDS:  Summary  Date Value Ref Range Status  08/15/2022 Note  Final    Comment:    ==================================================================== [employer] Select 13 (MW) ==================================================================== Test                             Result       Flag       Units  Drug Present and Declared for Prescription Verification   Hydrocodone                     5111         EXPECTED   ng/mg creat   Hydromorphone                   68           EXPECTED   ng/mg creat   Dihydrocodeine                 525          EXPECTED   ng/mg creat   Norhydrocodone                 2358         EXPECTED   ng/mg creat    Sources of hydrocodone  include scheduled prescription medications.    Hydromorphone , dihydrocodeine and norhydrocodone are expected    metabolites of hydrocodone . Hydromorphone  and dihydrocodeine are    also available as scheduled prescription medications.  ==================================================================== Test                      Result    Flag   Units      Ref Range   Creatinine              137              mg/dL      >=79 ==================================================================== Declared Medications:  The flagging and interpretation on this report are based on the  following declared medications.  Unexpected results may arise from  inaccuracies in the declared medications.   **Note: The testing scope of this panel includes these medications:   Hydrocodone  (Norco)   **Note: The testing scope of this panel does not include the  following reported medications:   Acetaminophen   (Norco)  Amiodarone   Benazepril  (Lotensin )  Buspirone  (Buspar )  Cyclobenzaprine  (Flexeril )  Duloxetine  (Cymbalta )  Eye Drop  Fluticasone  (Flonase )  Iron   Melatonin  Metoprolol   Multivitamin  Rivaroxaban  (Xarelto )  Trazodone   Vitamin B12  Vitamin C ==================================================================== For clinical consultation, please call [phone]. ====================================================================     No results found for: CBDTHCR No results found for: D8THCCBX No results found for: D9THCCBX  ROS  Constitutional: Denies any fever or chills Gastrointestinal: No reported hemesis, hematochezia, vomiting, or acute GI distress Musculoskeletal: Denies any acute onset joint swelling, redness, loss of ROM, or weakness Neurological: No reported episodes of acute onset apraxia, aphasia, dysarthria, agnosia, amnesia, paralysis, loss of coordination, or loss of consciousness  Medication Review  Calcium -Magnesium-Vitamin D , DULoxetine ,  Ferrous Bisglycinate Chelate, HYDROcodone -acetaminophen , amLODipine , atorvastatin , benazepril , busPIRone , carboxymethylcellulose, cholecalciferol, cyanocobalamin , cyclobenzaprine , fluticasone , furosemide , melatonin, multivitamin with minerals, rivaroxaban , and traZODone   History Review  Allergy: [name] is allergic to bupivacaine  liposome. Drug: [name]  reports no history of drug use. Alcohol :  reports that he does not currently use alcohol . Tobacco:  reports that he quit smoking about 44 years ago. His smoking use included cigarettes. He started smoking about 54 years ago. He has a 10 pack-year smoking history. He has quit using smokeless tobacco.  His smokeless tobacco use included snuff. Social: [name]  reports that he quit smoking about 44 years ago. His smoking use included cigarettes. He started smoking about 54 years ago. He has a 10 pack-year smoking history. He has quit using smokeless  tobacco.  His smokeless tobacco use included snuff. He reports that he does not currently use alcohol . He reports that he does not use drugs. Medical:  has a past medical history of (HFpEF) heart failure with preserved ejection fraction (HCC), Anxiety, Aortic atherosclerosis (HCC), Cardiomyopathy (in setting of Afib), Chronic pain syndrome, Chronic, continuous use of opioids, Coronary artery disease (11/06/2014), Diverticulosis, DJD (degenerative joint disease) of knee, Former smoker, History of hiatal hernia, History of kidney stones (2012), Hyperlipidemia, Hyperplastic colon polyp, Hypertension, Inguinal hernia, left, Internal hemorrhoids, Intervertebral disc disorder with radiculopathy of lumbosacral region, Lesion of bone of lumbosacral spine, Long term current use of amiodarone , Lower extremity edema, Mixed hyperlipidemia, Morbid obesity (HCC), Multiple falls, Myalgia due to statin, On rivaroxaban  therapy, Osteoarthritis, PAF (paroxysmal atrial fibrillation) (HCC), Pernicious anemia, Prostate cancer (HCC), Pulmonary nodule, right, Rotator cuff tear, left, Sleep difficulties, SVT (supraventricular tachycardia) (HCC), and Tubular adenoma of colon. Surgical: [name]  has a past surgical history that includes Knee surgery (Right); Gastric bypass (01/17/2000); prostate seeding; Carpal tunnel release (Right, 12/23/2014); Carpal tunnel release (Left, 01/06/2015); Colonoscopy with propofol  (N/A, 10/08/2017); Cardioversion (N/A, 05/15/2019); Cardioversion (N/A, 03/13/2014); Reverse shoulder arthroplasty (Right, 09/27/2021); Bicept tenodesis (Right, 09/27/2021); Shoulder arthroscopy with rotator cuff repair and open biceps tenodesis (Left, 12/27/2021); Cardioversion (N/A, 03/24/2022); Cardioversion (N/A, 05/19/2022); Colonoscopy with propofol  (N/A, 06/07/2022); Esophagogastroduodenoscopy (egd) with propofol  (N/A, 06/07/2022); enteroscopy (06/07/2022); Cardioversion (N/A, 07/25/2022); Cardioversion (N/A, 09/21/2022);  Atrial fibrillation ablation (N/A, 11/17/2022); and Reverse shoulder arthroplasty (Left, 02/20/2023). Family: family history includes Arrhythmia in his brother and sister; Cancer in his brother, father, and sister; Heart disease in his father; Hypertension in his father; Stroke in his father.  Laboratory Chemistry Profile   Renal Lab Results  Component Value Date   BUN 16 07/31/2023   CREATININE 0.90 07/31/2023   BCR SEE NOTE: 07/31/2023   GFRAA 72 07/21/2020   GFRNONAA >60 06/27/2023    Hepatic Lab Results  Component Value Date   AST 14 07/31/2023   ALT 11 07/31/2023   ALBUMIN 4.2 02/12/2023   ALKPHOS 71 02/12/2023   LIPASE 28 03/13/2017    Electrolytes Lab Results  Component Value Date   NA 141 07/31/2023   K 4.6 07/31/2023   CL 107 07/31/2023   CALCIUM  9.0 07/31/2023   MG 2.0 02/01/2021    Bone Lab Results  Component Value Date   VD25OH 30.79 06/27/2023   25OHVITD1 32 02/01/2021   25OHVITD2 <1.0 02/01/2021   25OHVITD3 32 02/01/2021    Inflammation (CRP: Acute Phase) (ESR: Chronic Phase) Lab Results  Component Value Date   CRP 1.7 02/09/2022   ESRSEDRATE 6 02/09/2022         Note: Above Lab results reviewed.  Recent Imaging Review  DG PAIN CLINIC C-ARM 1-60 MIN NO REPORT Fluoro was used, but no Radiologist interpretation will be provided.  Please refer to NOTES tab for provider progress note. Note: Reviewed        Physical Exam  Vitals: BP 126/87 (BP Location: Left Arm, Patient Position: Sitting, Cuff Size: Large)   Pulse 84   Temp 97.6 F (36.4 C) (Temporal)   Resp 16   Ht 6' (1.829 m)   Wt (!) 350 lb (158.8 kg)   SpO2 96%   BMI 47.47 kg/m  BMI: Estimated body mass index is 47.47 kg/m as calculated from the following:   Height as of this encounter: 6' (1.829 m).   Weight as of this encounter: 350 lb (158.8 kg). Ideal: Ideal body weight: 77.6 kg (171 lb 1.2 oz) Adjusted ideal body weight: 110.1 kg (242 lb 10.3 oz) General appearance: Well  nourished, well developed, and well hydrated. In no apparent acute distress Mental status: Alert, oriented x 3 (person, place, & time)       Respiratory: No evidence of acute respiratory distress Eyes: PERLA   Assessment   Diagnosis Status  1. Chronic pain syndrome   2. Lumbar facet arthropathy   3. Chronic radicular lumbar pain   4. Primary osteoarthritis of both wrists (Right>left)    Controlled Controlled Controlled   Updated Problems: No problems updated.  Plan of Care  Refill of hydrocodone  as below, continue Cymbalta  as prescribed.  Follow-up as needed for repeat lumbar RFA.  States that low back pain is stable and manageable.  [name] has a current medication list which includes the following long-term medication(s): amlodipine , atorvastatin , benazepril , duloxetine , ferrous bisglycinate chelate, fluticasone , furosemide , trazodone , and xarelto .  Pharmacotherapy (Medications Ordered): Meds ordered this encounter  Medications   HYDROcodone -acetaminophen  (NORCO) 7.5-325 MG tablet    Sig: Take 1 tablet by mouth every 6 (six) hours as needed for moderate pain (pain score 4-6).    Dispense:  120 tablet    Refill:  0   HYDROcodone -acetaminophen  (NORCO) 7.5-325 MG tablet    Sig: Take 1 tablet by mouth every 6 (six) hours as needed for moderate pain (pain score 4-6).    Dispense:  120 tablet    Refill:  0   HYDROcodone -acetaminophen  (NORCO) 7.5-325 MG tablet    Sig: Take 1 tablet by mouth every 6 (six) hours as needed for moderate pain (pain score 4-6).    Dispense:  120 tablet    Refill:  0   Orders:  Orders Placed This Encounter  Procedures   [employer] Select 13 (MW), Urine    Volume: 30 ml(s). Minimum 3 ml of urine is needed. Document temperature of fresh sample. Indications: Long term (current) use of opiate analgesic (S20.108)    Release to patient:   Immediate     s/p lesi #1 (left L4/5) on 8/17, #2 on 10/21/2018 (left L3/4), #3 on 12/09/2018 (Left L3/4);  02/24/2019: Right wrist injection and left trigger finger (middle finger) injection.,  Bilateral wrist injection 10/15/2019, bilateral knee Hyalgan No. 1 01/26/1960. #2 02/23/2020, #3 03/24/2020.  Endorsing approximately 75 to 80% pain relief for bilateral knee pain.  Lumbar L3-5 RFA, Left: 02/16/21, Right 03/09/21, 01/02/22      Return in about 3 months (around 11/20/2023) for Emmy Blanch, MM.    Recent Visits Date Type Provider Dept  05/30/23 Procedure visit Marcelino Nurse, MD [facility]  Showing recent visits within past 90 days and meeting all other requirements Today's Visits Date  Type Provider Dept  08/20/23 Procedure visit Marcelino Nurse, MD [facility]  Showing today's visits and meeting all other requirements Future Appointments No visits were found meeting these conditions. Showing future appointments within next 90 days and meeting all other requirements  I discussed the assessment and treatment plan with the patient. The patient was provided an opportunity to ask questions and all were answered. The patient agreed with the plan and demonstrated an understanding of the instructions.  Patient advised to call back or seek an in-person evaluation if the symptoms or condition worsens.  Duration of encounter: 30 consider Total time on encounter, as per AMA guidelines included both the face-to-face and non-face-to-face time personally spent by the physician and/or other qualified health care professional(s) on the day of the encounter (includes time in activities that require the physician or other qualified health care professional and does not include time in activities normally performed by clinical staff). Physician's time may include the following activities when performed: Preparing to see the patient (e.g., pre-charting review of records, searching for previously ordered imaging, lab work, and nerve conduction tests) Review of prior analgesic pharmacotherapies. Reviewing  PMP Interpreting ordered tests (e.g., lab work, imaging, nerve conduction tests) Performing post-procedure evaluations, including interpretation of diagnostic procedures Obtaining and/or reviewing separately obtained history Performing a medically appropriate examination and/or evaluation Counseling and educating the patient/family/caregiver Ordering medications, tests, or procedures Referring and communicating with other health care professionals (when not separately reported) Documenting clinical information in the electronic or other health record Independently interpreting results (not separately reported) and communicating results to the patient/ family/caregiver Care coordination (not separately reported)  Note by: Nurse Marcelino, MD (TTS and AI technology used. I apologize for any typographical errors that were not detected and corrected.) Date: 08/20/2023; Time: 2:06 PM

## 2023-08-20 NOTE — Progress Notes (Signed)
 Nursing Pain Medication Assessment:  Safety precautions to be maintained throughout the outpatient stay will include: orient to surroundings, keep bed in low position, maintain call bell within reach at all times, provide assistance with transfer out of bed and ambulation.  Medication Inspection Compliance: Pill count conducted under aseptic conditions, in front of the patient. Neither the pills nor the bottle was removed from the patient's sight at any time. Once count was completed pills were immediately returned to the patient in their original bottle.  Medication: Hydrocodone /APAP Pill/Patch Count: 3 of 120 pills/patches remain Pill/Patch Appearance: Markings consistent with prescribed medication Bottle Appearance: Standard pharmacy container. Clearly labeled. Filled Date: 07 / 02 / 2025 Last Medication intake:  Today

## 2023-08-21 ENCOUNTER — Encounter: Admitting: Nurse Practitioner

## 2023-08-21 ENCOUNTER — Telehealth: Payer: Self-pay | Admitting: *Deleted

## 2023-08-21 NOTE — Telephone Encounter (Signed)
 Post procedure call; did not aspirate the knee yesterday, otherwise patient is doing okay.

## 2023-08-22 LAB — TOXASSURE SELECT 13 (MW), URINE

## 2023-08-27 ENCOUNTER — Inpatient Hospital Stay: Attending: Internal Medicine

## 2023-08-27 VITALS — BP 145/85 | HR 110 | Temp 98.0°F | Resp 18

## 2023-08-27 DIAGNOSIS — D649 Anemia, unspecified: Secondary | ICD-10-CM | POA: Insufficient documentation

## 2023-08-27 MED ORDER — IRON SUCROSE 20 MG/ML IV SOLN
200.0000 mg | Freq: Once | INTRAVENOUS | Status: AC
Start: 2023-08-27 — End: 2023-08-27
  Administered 2023-08-27 (×2): 200 mg via INTRAVENOUS
  Filled 2023-08-27: qty 10

## 2023-08-27 MED ORDER — SODIUM CHLORIDE 0.9% FLUSH
10.0000 mL | Freq: Once | INTRAVENOUS | Status: AC | PRN
  Administered 2023-08-27 (×2): 10 mL
  Filled 2023-08-27: qty 10

## 2023-08-28 ENCOUNTER — Telehealth: Payer: Self-pay

## 2023-08-28 DIAGNOSIS — I10 Essential (primary) hypertension: Secondary | ICD-10-CM

## 2023-08-28 DIAGNOSIS — E782 Mixed hyperlipidemia: Secondary | ICD-10-CM

## 2023-08-28 NOTE — Telephone Encounter (Signed)
 Patient notified referral sent for coronary CT scan and someone should be calling him. Also notified to pause or hold the atorvastatin  for now. Patient stated he was only taking 1/2 the dose but felt he could not go. He has paused the medication.

## 2023-08-28 NOTE — Telephone Encounter (Signed)
 Copied from CRM [id number]. Topic: Clinical - Prescription Issue >> Aug 27, 2023  4:38 PM Zebedee SAUNDERS wrote: Reason for CRM: Pt called stated atorvastatin  (LIPITOR) 10 MG tablet is causing legs to hurt. Please contact pt via MyChart. Pt want to get referral for heart blockage.

## 2023-08-28 NOTE — Telephone Encounter (Signed)
 I have placed order for Coronary Calcium  CT Scan for heart evaluation. They will call him to schedule. If needed we can refer to Cardiologist based on that result. The scan does not automatically include the referral.  If he cannot tolerate Atorvastatin , it is okay to pause or hold it for now, we may need to try half dose or different version in the future.  Marsa Officer, DO [facility] 08/28/2023, 1:08 PM

## 2023-08-28 NOTE — Addendum Note (Signed)
 Addended by: EDMAN MARSA PARAS on: 08/28/2023 01:08 PM   Modules accepted: Orders

## 2023-08-31 DIAGNOSIS — M778 Other enthesopathies, not elsewhere classified: Secondary | ICD-10-CM | POA: Diagnosis not present

## 2023-08-31 DIAGNOSIS — M7522 Bicipital tendinitis, left shoulder: Secondary | ICD-10-CM | POA: Diagnosis not present

## 2023-09-11 ENCOUNTER — Ambulatory Visit
Admission: RE | Admit: 2023-09-11 | Discharge: 2023-09-11 | Disposition: A | Discharge status: Home / Self Care | Payer: Self-pay | Source: Ambulatory Visit | Attending: Family Medicine | Admitting: Family Medicine

## 2023-09-11 DIAGNOSIS — Z6841 Body Mass Index (BMI) 40.0 and over, adult: Secondary | ICD-10-CM | POA: Insufficient documentation

## 2023-09-11 DIAGNOSIS — I10 Essential (primary) hypertension: Secondary | ICD-10-CM | POA: Insufficient documentation

## 2023-09-11 DIAGNOSIS — E782 Mixed hyperlipidemia: Secondary | ICD-10-CM | POA: Insufficient documentation

## 2023-09-12 ENCOUNTER — Ambulatory Visit: Payer: Self-pay | Admitting: Family Medicine

## 2023-09-13 ENCOUNTER — Ambulatory Visit: Payer: Self-pay

## 2023-09-13 ENCOUNTER — Observation Stay
Admission: EM | Admit: 2023-09-13 | Discharge: 2023-09-14 | Disposition: A | Discharge status: Home / Self Care | Attending: Internal Medicine | Admitting: Internal Medicine

## 2023-09-13 ENCOUNTER — Encounter: Payer: Self-pay | Admitting: Emergency Medicine

## 2023-09-13 ENCOUNTER — Other Ambulatory Visit: Payer: Self-pay

## 2023-09-13 ENCOUNTER — Emergency Department

## 2023-09-13 DIAGNOSIS — K5730 Diverticulosis of large intestine without perforation or abscess without bleeding: Secondary | ICD-10-CM | POA: Diagnosis not present

## 2023-09-13 DIAGNOSIS — Z9884 Bariatric surgery status: Secondary | ICD-10-CM | POA: Diagnosis not present

## 2023-09-13 DIAGNOSIS — G894 Chronic pain syndrome: Secondary | ICD-10-CM | POA: Diagnosis not present

## 2023-09-13 DIAGNOSIS — E782 Mixed hyperlipidemia: Secondary | ICD-10-CM | POA: Insufficient documentation

## 2023-09-13 DIAGNOSIS — I5022 Chronic systolic (congestive) heart failure: Secondary | ICD-10-CM | POA: Diagnosis not present

## 2023-09-13 DIAGNOSIS — I700 Atherosclerosis of aorta: Secondary | ICD-10-CM | POA: Diagnosis not present

## 2023-09-13 DIAGNOSIS — Z96611 Presence of right artificial shoulder joint: Secondary | ICD-10-CM | POA: Insufficient documentation

## 2023-09-13 DIAGNOSIS — I110 Hypertensive heart disease with heart failure: Secondary | ICD-10-CM | POA: Insufficient documentation

## 2023-09-13 DIAGNOSIS — I10 Essential (primary) hypertension: Secondary | ICD-10-CM

## 2023-09-13 DIAGNOSIS — K5792 Diverticulitis of intestine, part unspecified, without perforation or abscess without bleeding: Secondary | ICD-10-CM | POA: Diagnosis not present

## 2023-09-13 DIAGNOSIS — I480 Paroxysmal atrial fibrillation: Secondary | ICD-10-CM | POA: Insufficient documentation

## 2023-09-13 DIAGNOSIS — I5032 Chronic diastolic (congestive) heart failure: Secondary | ICD-10-CM

## 2023-09-13 DIAGNOSIS — Z6841 Body Mass Index (BMI) 40.0 and over, adult: Secondary | ICD-10-CM | POA: Insufficient documentation

## 2023-09-13 DIAGNOSIS — K5793 Diverticulitis of intestine, part unspecified, without perforation or abscess with bleeding: Principal | ICD-10-CM | POA: Insufficient documentation

## 2023-09-13 DIAGNOSIS — Z79899 Other long term (current) drug therapy: Secondary | ICD-10-CM | POA: Insufficient documentation

## 2023-09-13 DIAGNOSIS — Z7901 Long term (current) use of anticoagulants: Secondary | ICD-10-CM | POA: Diagnosis not present

## 2023-09-13 DIAGNOSIS — K625 Hemorrhage of anus and rectum: Principal | ICD-10-CM

## 2023-09-13 DIAGNOSIS — Z87891 Personal history of nicotine dependence: Secondary | ICD-10-CM | POA: Insufficient documentation

## 2023-09-13 DIAGNOSIS — E669 Obesity, unspecified: Secondary | ICD-10-CM | POA: Diagnosis not present

## 2023-09-13 DIAGNOSIS — K8020 Calculus of gallbladder without cholecystitis without obstruction: Secondary | ICD-10-CM | POA: Diagnosis not present

## 2023-09-13 DIAGNOSIS — I4819 Other persistent atrial fibrillation: Secondary | ICD-10-CM

## 2023-09-13 LAB — CBC
HCT: 44.1 % (ref 39.0–52.0)
Hemoglobin: 14.1 g/dL (ref 13.0–17.0)
MCH: 28.5 pg (ref 26.0–34.0)
MCHC: 32 g/dL (ref 30.0–36.0)
MCV: 89.1 fL (ref 80.0–100.0)
Platelets: 200 K/uL (ref 150–400)
RBC: 4.95 MIL/uL (ref 4.22–5.81)
RDW: 13.3 % (ref 11.5–15.5)
WBC: 14.9 K/uL — ABNORMAL HIGH (ref 4.0–10.5)
nRBC: 0 % (ref 0.0–0.2)

## 2023-09-13 LAB — COMPREHENSIVE METABOLIC PANEL WITH GFR
ALT: 12 U/L (ref 0–44)
AST: 17 U/L (ref 15–41)
Albumin: 4 g/dL (ref 3.5–5.0)
Alkaline Phosphatase: 73 U/L (ref 38–126)
Anion gap: 12 (ref 5–15)
BUN: 16 mg/dL (ref 8–23)
CO2: 24 mmol/L (ref 22–32)
Calcium: 8.9 mg/dL (ref 8.9–10.3)
Chloride: 104 mmol/L (ref 98–111)
Creatinine, Ser: 1.04 mg/dL (ref 0.61–1.24)
GFR, Estimated: >60 mL/min (ref >60)
Glucose, Bld: 115 mg/dL — ABNORMAL HIGH (ref 70–99)
Potassium: 4.6 mmol/L (ref 3.5–5.1)
Sodium: 140 mmol/L (ref 135–145)
Total Bilirubin: 0.9 mg/dL (ref 0.0–1.2)
Total Protein: 6.5 g/dL (ref 6.5–8.1)

## 2023-09-13 LAB — LACTIC ACID, PLASMA: Lactic Acid, Venous: 1.2 mmol/L (ref 0.5–1.9)

## 2023-09-13 LAB — PROTIME-INR
INR: 1.2 (ref 0.8–1.2)
Prothrombin Time: 16.1 s — ABNORMAL HIGH (ref 11.4–15.2)

## 2023-09-13 LAB — TYPE AND SCREEN
ABO/RH(D): O NEG
Antibody Screen: NEGATIVE

## 2023-09-13 LAB — APTT: aPTT: 37 s — ABNORMAL HIGH (ref 24–36)

## 2023-09-13 MED ORDER — TRAZODONE HCL 50 MG PO TABS
150.0000 mg | ORAL_TABLET | Freq: Every day | ORAL | Status: DC
  Administered 2023-09-13: 150 mg via ORAL
  Filled 2023-09-13: qty 3

## 2023-09-13 MED ORDER — SODIUM CHLORIDE 0.9 % IV BOLUS
500.0000 mL | Freq: Once | INTRAVENOUS | Status: AC
  Administered 2023-09-13: 500 mL via INTRAVENOUS

## 2023-09-13 MED ORDER — ACETAMINOPHEN 325 MG PO TABS: 650.0000 mg | ORAL_TABLET | Freq: Four times a day (QID) | ORAL | Status: DC | PRN

## 2023-09-13 MED ORDER — ONDANSETRON HCL 4 MG/2ML IJ SOLN: 4.0000 mg | Freq: Four times a day (QID) | INTRAMUSCULAR | Status: DC | PRN

## 2023-09-13 MED ORDER — BENAZEPRIL HCL 20 MG PO TABS
40.0000 mg | ORAL_TABLET | Freq: Every day | ORAL | Status: DC
  Administered 2023-09-14: 40 mg via ORAL
  Filled 2023-09-13: qty 2

## 2023-09-13 MED ORDER — SODIUM CHLORIDE 0.9 % IV SOLN
2.0000 g | INTRAVENOUS | Status: DC
  Administered 2023-09-14: 2 g via INTRAVENOUS
  Filled 2023-09-13: qty 20

## 2023-09-13 MED ORDER — AMLODIPINE BESYLATE 5 MG PO TABS
5.0000 mg | ORAL_TABLET | Freq: Every day | ORAL | Status: DC
Start: 2023-09-14 — End: 2023-09-14
  Administered 2023-09-14: 5 mg via ORAL
  Filled 2023-09-13: qty 1

## 2023-09-13 MED ORDER — PIPERACILLIN-TAZOBACTAM 3.375 G IVPB
3.3750 g | Freq: Once | INTRAVENOUS | Status: AC
  Administered 2023-09-13: 3.375 g via INTRAVENOUS
  Filled 2023-09-13: qty 50

## 2023-09-13 MED ORDER — METRONIDAZOLE 500 MG/100ML IV SOLN
500.0000 mg | Freq: Two times a day (BID) | INTRAVENOUS | Status: DC
  Administered 2023-09-14: 500 mg via INTRAVENOUS
  Filled 2023-09-13: qty 100

## 2023-09-13 MED ORDER — ATORVASTATIN CALCIUM 20 MG PO TABS
10.0000 mg | ORAL_TABLET | ORAL | Status: DC
  Filled 2023-09-13: qty 1

## 2023-09-13 MED ORDER — CYCLOBENZAPRINE HCL 10 MG PO TABS
10.0000 mg | ORAL_TABLET | Freq: Every day | ORAL | Status: DC
  Administered 2023-09-13: 10 mg via ORAL
  Filled 2023-09-13: qty 1

## 2023-09-13 MED ORDER — ONDANSETRON HCL 4 MG PO TABS: 4.0000 mg | ORAL_TABLET | Freq: Four times a day (QID) | ORAL | Status: DC | PRN

## 2023-09-13 MED ORDER — DULOXETINE HCL 30 MG PO CPEP
60.0000 mg | ORAL_CAPSULE | Freq: Every day | ORAL | Status: DC
  Administered 2023-09-14: 60 mg via ORAL
  Filled 2023-09-13: qty 2

## 2023-09-13 MED ORDER — MELATONIN 5 MG PO TABS
2.5000 mg | ORAL_TABLET | Freq: Every day | ORAL | Status: DC
  Administered 2023-09-13: 2.5 mg via ORAL
  Filled 2023-09-13: qty 1

## 2023-09-13 MED ORDER — BUSPIRONE HCL 10 MG PO TABS
10.0000 mg | ORAL_TABLET | Freq: Three times a day (TID) | ORAL | Status: DC
  Administered 2023-09-13 – 2023-09-14 (×2): 10 mg via ORAL
  Filled 2023-09-13 (×2): qty 1

## 2023-09-13 MED ORDER — IOHEXOL 350 MG/ML SOLN
125.0000 mL | Freq: Once | INTRAVENOUS | Status: AC | PRN
  Administered 2023-09-13: 125 mL via INTRAVENOUS

## 2023-09-13 MED ORDER — ACETAMINOPHEN 650 MG RE SUPP: 650.0000 mg | Freq: Four times a day (QID) | RECTAL | Status: DC | PRN

## 2023-09-13 MED ORDER — HYDROCODONE-ACETAMINOPHEN 7.5-325 MG PO TABS
1.0000 | ORAL_TABLET | Freq: Four times a day (QID) | ORAL | Status: DC | PRN
  Administered 2023-09-13 – 2023-09-14 (×3): 1 via ORAL
  Filled 2023-09-13 (×3): qty 1

## 2023-09-13 NOTE — Hospital Course (Addendum)
 68 y.o. M with MO s/p gastric bypass, HTN, CAD, HFmrEF more recently recovered to 55-60%, pros CA s/p brachytherapy, pAF s/p cardioversion on Xarelto , HTN, and dCHF who presented with bright red rectal bleeding.  Has had rectal spotting for over a year, EGD, colonoscopy in 2024 by Dr. Unk showed diverticulosis and hemorrhoids only.  Then in the last 5 days, started to pass more blood, filling the toilet bowl, passing red clots, and today several bloody BMs so he came to the ER.  In the ER, HGb normal, WBC 14K.  CTA abdomen showed no active bleeding, but did show sigmoid divertiulitis.   Given antibiotics and hospitalists consulted for observation overnight.

## 2023-09-13 NOTE — H&P (Signed)
 History and Physical    Patient: [name] FMW:[id number] DOB: 08/11/1955 DOA: 09/13/2023 DOS: the patient was seen and examined on 09/13/2023 PCP: Edman Marsa JINNY, DO  Patient coming from: Home  Chief Complaint:  Chief Complaint  Patient presents with   Rectal Bleeding       HPI:  68 y.o. M with MO s/p gastric bypass, HTN, CAD, HFmrEF more recently recovered to 55-60%, pros CA s/p brachytherapy, pAF s/p cardioversion on Xarelto , HTN, and dCHF who presented with bright red rectal bleeding.  Has had rectal spotting for over a year, EGD, colonoscopy in 2024 by Dr. Unk showed diverticulosis and hemorrhoids only.  Then in the last 5 days, started to pass more blood, filling the toilet bowl, passing red clots, and today several bloody BMs so he came to the ER.  In the ER, HGb normal, WBC 14K.  CTA abdomen showed no active bleeding, but did show sigmoid divertiulitis.   Given antibiotics and hospitalists consulted for observation overnight.      Review of Systems  Constitutional:  Positive for malaise/fatigue. Negative for chills and fever.  Gastrointestinal:  Positive for abdominal pain, blood in stool and diarrhea. Negative for constipation, melena, nausea and vomiting.  All other systems reviewed and are negative.    Past Medical History:  Diagnosis Date   (HFpEF) heart failure with preserved ejection fraction (HCC)    a.) TTE 12/26/2013: EF 45-50%, mild ant and antsept HK. mild MR. Mod dil LA. nl RV fxn. Rhythm was Afib; b.) TTE 07/04/2019: EF 55%, mid-apical anteroseptal HK, mild MAC, mild Ao sclerosis, G2DD, RVSP 45.3; c.) TTE 06/02/2022: EF 55-60%, no RWMAs, sev LAE, RVSP 37.9, mild-mod MR, mild AR, AoV sclerosis without stenosis, asc Ao 38 mm   Anxiety    Aortic atherosclerosis (HCC)    Cardiomyopathy (in setting of Afib)    a.) TTE 12/26/2013: EF 45-50%; b.) TTE 07/04/2019: EF 55%; c.) TTE 06/02/2022: EF 55-60%   Chronic pain syndrome    a.) followed by pain  management   Chronic, continuous use of opioids    a.) on COT; followed by pain management   Coronary artery disease 11/06/2014   a.) cCTA 11/06/2014: Ca2+ = 224 (74th %ile; LAD distribution)   Diverticulosis    DJD (degenerative joint disease) of knee    Former smoker    History of hiatal hernia    History of kidney stones 2012   Hyperlipidemia    Hyperplastic colon polyp    Hypertension    Inguinal hernia, left    Internal hemorrhoids    Intervertebral disc disorder with radiculopathy of lumbosacral region    Lesion of bone of lumbosacral spine    L5   Long term current use of amiodarone     Lower extremity edema    Mixed hyperlipidemia    Morbid obesity (HCC)    Multiple falls    Myalgia due to statin    On rivaroxaban  therapy    Osteoarthritis    PAF (paroxysmal atrial fibrillation) (HCC)    a.) CHA2DS2VASc = 4 (age, HTN, CHF, vascular disease) as of 02/19/23;  b.) s/p DCCV 03/13/14 (200 J), 05/15/19 (150 J x 1, 200 J x2), 05/19/2022 (150 J x1, 200 J x2; pads changed to ant/lat postion with additional 200 J x 2 -> did not convert), 07/25/22 (200 J), 09/21/2022 (200 J); c.) s/p PVI ablation 11/17/2022; d.) rate/rhythm maintained on amiodarone ; chronically anticoagulated on rivaroxaban    Pernicious anemia    Prostate cancer (  HCC)    Pulmonary nodule, right    a. 10/2014 Cardiac CTA: 7mm RLL nodule; b. 04/2015 CT Chest: stable 7mm RLL nodule. No new nodules; 02/2017 CTA Chest: stable, benign, 7mm RLL pulm nodule.   Rotator cuff tear, left    Sleep difficulties    a.) takes melatonin + trazodone  PRN   SVT (supraventricular tachycardia) (HCC)    Tubular adenoma of colon    Past Surgical History:  Procedure Laterality Date   ATRIAL FIBRILLATION ABLATION N/A 11/17/2022   Procedure: ATRIAL FIBRILLATION ABLATION; Location: [facility]; Surgeon: Gehi, Anil, MD   BICEPT TENODESIS Right 09/27/2021   Procedure: Right reverse shoulder arthroplasty, biceps tenodesis;  Surgeon: Tobie Priest, MD;   Location: [facility];  Service: Orthopedics;  Laterality: Right;   CARDIOVERSION N/A 05/15/2019   Procedure: CARDIOVERSION;  Surgeon: Perla Evalene PARAS, MD;  Location: [facility];  Service: Cardiovascular;  Laterality: N/A;   CARDIOVERSION N/A 03/13/2014   Procedure: CARDIOVERSION; Location: [facility]; Surgeon: Evalene Perla, MD   CARDIOVERSION N/A 03/24/2022   Procedure: CARDIOVERSION;  Surgeon: Perla Evalene PARAS, MD;  Location: [facility];  Service: Cardiovascular;  Laterality: N/A;   CARDIOVERSION N/A 05/19/2022   Procedure: CARDIOVERSION;  Surgeon: Perla Evalene PARAS, MD;  Location: [facility];  Service: Cardiovascular;  Laterality: N/A;   CARDIOVERSION N/A 07/25/2022   Procedure: CARDIOVERSION;  Surgeon: Fernande Elspeth BROCKS, MD;  Location: [facility];  Service: Cardiovascular;  Laterality: N/A;   CARDIOVERSION N/A 09/21/2022   Procedure: CARDIOVERSION;  Surgeon: Fernande Elspeth BROCKS, MD;  Location: [facility];  Service: Cardiovascular;  Laterality: N/A;   CARPAL TUNNEL RELEASE Right 12/23/2014   Procedure: CARPAL TUNNEL RELEASE;  Surgeon: Kayla Pinal, MD;  Location: [facility];  Service: Orthopedics;  Laterality: Right;   CARPAL TUNNEL RELEASE Left 01/06/2015   Procedure: CARPAL TUNNEL RELEASE;  Surgeon: Kayla Pinal, MD;  Location: [facility];  Service: Orthopedics;  Laterality: Left;   COLONOSCOPY WITH PROPOFOL  N/A 10/08/2017   Procedure: COLONOSCOPY WITH PROPOFOL ;  Surgeon: Unk Corinn Skiff, MD;  Location: [facility];  Service: Gastroenterology;  Laterality: N/A;   COLONOSCOPY WITH PROPOFOL  N/A 06/07/2022   Procedure: COLONOSCOPY WITH PROPOFOL ;  Surgeon: Unk Corinn Skiff, MD;  Location: [facility]OSCOPY;  Service: Gastroenterology;  Laterality: N/A;   ENTEROSCOPY  06/07/2022   Procedure: ENTEROSCOPY;  Surgeon: Unk Corinn Skiff, MD;  Location[facility];  Service: Gastroenterology;;   ESOPHAGOGASTRODUODENOSCOPY (EGD) WITH PROPOFOL  N/A 06/07/2022   Procedure: ESOPHAGOGASTRODUODENOSCOPY (EGD) WITH  PROPOFOL ;  Surgeon: Unk Corinn Skiff, MD;  Location: [facility] ENDOSCOPY;  Service: Gastroenterology;  Laterality: N/A;   GASTRIC BYPASS  01/17/2000   KNEE SURGERY Right    knee trauma x3   prostate seeding     REVERSE SHOULDER ARTHROPLASTY Right 09/27/2021   Procedure: Right reverse shoulder arthroplasty, biceps tenodesis;  Surgeon: Tobie Priest, MD;  Location: [facility];  Service: Orthopedics;  Laterality: Right;   REVERSE SHOULDER ARTHROPLASTY Left 02/20/2023   Procedure: Left reverse shoulder arthroplasty, biceps tenodesis;  Surgeon: Tobie Priest, MD;  Location: [facility];  Service: Orthopedics;  Laterality: Left;   SHOULDER ARTHROSCOPY WITH ROTATOR CUFF REPAIR AND OPEN BICEPS TENODESIS Left 12/27/2021   Procedure: Left shoulder arthroscopic cuff repair (supraspinatus and subscapularis) with Regeneten Patch application;  Surgeon: Tobie Priest, MD;  Location: [facility];  Service: Orthopedics;  Laterality: Left;   Social History:  reports that he quit smoking about 44 years ago. His smoking use included cigarettes. He started smoking about 54 years ago. He has a 10 pack-year smoking history.  He has quit using smokeless tobacco.  His smokeless tobacco use included snuff. He reports that he does not currently use alcohol . He reports that he does not use drugs.  Allergies  Allergen Reactions   Bupivacaine  Liposome Palpitations    Pt was given this for shoulder surgery in 2023 and states heart was pounding    Family History  Problem Relation Age of Onset   Heart disease Father    Hypertension Father    Stroke Father    Cancer Father        prostate cancer   Cancer Sister    Arrhythmia Sister        A-fib   Arrhythmia Brother        A-fib   Cancer Brother     Prior to Admission medications   Medication Sig Start Date End Date Taking? Authorizing Provider  amLODipine  (NORVASC ) 5 MG tablet Take 1 tablet (5 mg total) by mouth daily. 06/01/23   Riddle, Suzann, NP  atorvastatin  (LIPITOR) 10 MG  tablet Take 1 tablet (10 mg total) by mouth every other day. 08/06/23   Karamalegos, Marsa PARAS, DO  benazepril  (LOTENSIN ) 40 MG tablet TAKE ONE TABLET BY MOUTH ONE TIME DAILY 07/17/23   Gollan, Timothy J, MD  busPIRone  (BUSPAR ) 10 MG tablet TAKE ONE TABLET BY MOUTH FOUR TIMES A DAY AS NEEDED FOR ANIXETY 06/21/23   Karamalegos, Marsa PARAS, DO  Calcium -Magnesium-Vitamin D  (CITRACAL CALCIUM  +D3 PO)  04/01/23   [provider]  carboxymethylcellulose (REFRESH PLUS) 0.5 % SOLN Place 1-2 drops into both eyes 3 (three) times daily as needed (dry/irritated eyes.).    [provider]  cholecalciferol (VITAMIN D3) 25 MCG (1000 UNIT) tablet Take 1,000 Units by mouth daily.    [provider]  cholecalciferol (VITAMIN D3) 25 MCG (1000 UNIT) tablet Take 5,000 Units by mouth daily.    [provider]  cyanocobalamin  (VITAMIN B12) 1000 MCG/ML injection INJECT 1ML INTO THE SKIN EVERY 30 DAYS AS DIRECTED Patient taking differently: Inject 1,000 mcg into the skin every 30 (thirty) days. 09/27/22   Karamalegos, Marsa PARAS, DO  cyclobenzaprine  (FLEXERIL ) 10 MG tablet Take 1 tablet (10 mg total) by mouth at bedtime. 02/08/23   Karamalegos, Marsa PARAS, DO  DULoxetine  (CYMBALTA ) 60 MG capsule Take 1 capsule (60 mg total) by mouth daily. 08/06/23   Karamalegos, Marsa PARAS, DO  FERROUS BISGLYCINATE CHELATE PO Take 25 mg by mouth daily. Bronson Iron  Bisglycinate 25 mg Gentle on The Stomach Supports Energy & Healthy Red Blood Cell Production    [provider]  fluticasone  (FLONASE ) 50 MCG/ACT nasal spray USE TWO SPRAYS IN EACH NOSTRIL ONE TIME DAILY 05/15/23   Edman, Marsa PARAS, DO  furosemide  (LASIX ) 20 MG tablet Take 1 tablet (20 mg total) by mouth daily as needed for edema. 08/06/23   Karamalegos, Marsa PARAS, DO  HYDROcodone -acetaminophen  (NORCO) 7.5-325 MG tablet Take 1 tablet by mouth every 6 (six) hours as needed for moderate pain (pain score 4-6). 08/20/23 09/19/23  Marcelino Nurse, MD  HYDROcodone -acetaminophen  (NORCO) 7.5-325 MG tablet Take 1 tablet by mouth every 6 (six) hours as needed for moderate pain (pain score 4-6). 09/19/23 10/19/23  Marcelino Nurse, MD  HYDROcodone -acetaminophen  (NORCO) 7.5-325 MG tablet Take 1 tablet by mouth every 6 (six) hours as needed for moderate pain (pain score 4-6). 10/19/23 11/18/23  Marcelino Nurse, MD  melatonin 3 MG TABS tablet Take 3 mg by mouth at bedtime. 03/04/21   [provider]  Multiple Vitamin (  MULTIVITAMIN WITH MINERALS) TABS tablet Take 1 tablet by mouth in the morning.    [provider]  traZODone  (DESYREL ) 150 MG tablet TAKE ONE TABLET BY MOUTH AT BEDTIME 07/17/23   Karamalegos, Marsa PARAS, DO  XARELTO  20 MG TABS tablet TAKE ONE TABLET BY MOUTH ONE TIME DAILY WITH SUPPER 04/16/23   Perla Evalene PARAS, MD    Physical Exam: Vitals:   09/13/23 1837 09/13/23 1838 09/13/23 1914  BP:  132/89   Pulse:  (!) 113   Resp:  17   Temp:  100.1 F (37.8 C)   TempSrc:  Oral   SpO2:  98% 100%  Weight: (!) 158.8 kg    Height: 6' (1.829 m)     Adult male, lying in bed, interactive and appropriate, makes eye contact, hygiene normal Sclera anicteric, conjunctival pink, lids and lashes normal.  No nasal deformity, discharge, or epistaxis Dentition in good repair, oropharynx moist, no oral lesions, lips normal RRR, no murmurs, no peripheral edema, JVP not visible Respiratory rate normal, lung sounds clear without rales or wheezes bilaterally Abdomen soft, mild tenderness to palpation deeply on the left side, no rigidity or rebound at all No deformities or effusions of the large joints bilaterally or of the small joints of the hands bilaterally Attention normal, affect appropriate, oriented x 3, face symmetric, speech fluent, upper extremity strength 5/5 and symmetric, extraocular movements intact     Data Reviewed: Basic metabolic panel normal LFTs normal Lactic acid normal CBC shows leukocytosis, no anemia INR  1.2 PTT 37 Blood cultures pending CT of the abdomen and pelvis angiogram shows no stenosis of the abdominal vasculature, no dissection, no intraluminal bleeding.  It does show acute sigmoid diverticulitis without abscess    Assessment and Plan: * Acute diverticulitis Rectal bleeding Presented with LLQ cramps, increased rectal bleeding.  Found to have leukocytosis, CT evidence of diverticulitis without abscess.  EGD and colonoscopy 1 year ago showed hemorrhoids and diverticulosis only. Hemodynamically stable, hemoglobin stable. - Continue Rocephin  and Flagyl  for diverticulitis - Soft diet  Given normal Hgb (has history B12 anemia due to bypass and iron  deficiency anemia currently replete) -Trend hemoglobin - Consult GI - Hold Xarelto     Chronic diastolic CHF (congestive heart failure) (HCC) Appears euvolemic - Hold Lasix   Chronic pain syndrome - Continue home Norco 7.5-325 and Flexeril   Paroxysmal atrial fibrillation Sounds regular on exam. - Hold Xarelto   Mixed hyperlipidemia - Continue LIpitor  Morbid obesity with BMI of 45.0-49.9, adult (HCC) BMI of 47, complicates care  H/O gastric bypass    Essential hypertension BP normal - Hold Lasix  - Continue benazepril , amlodipine          Advance Care Planning: Full code  Consults: GI, messaged Dr. Onita  Family Communication: Wife at the bedside  Severity of Illness: The appropriate patient status for this patient is OBSERVATION. Observation status is judged to be reasonable and necessary in order to provide the required intensity of service to ensure the patient's safety. The patient's presenting symptoms, physical exam findings, and initial radiographic and laboratory data in the context of their medical condition is felt to place them at decreased risk for further clinical deterioration. Furthermore, it is anticipated that the patient will be medically stable for discharge from the hospital within 2 midnights  of admission.   Author: Lonni SHAUNNA Dalton, MD 09/13/2023 9:18 PM  For on call review [email or web address].

## 2023-09-13 NOTE — Assessment & Plan Note (Addendum)
 Rectal bleeding Presented with LLQ cramps, increased rectal bleeding.  Found to have leukocytosis, CT evidence of diverticulitis without abscess.  EGD and colonoscopy 1 year ago showed hemorrhoids and diverticulosis only. Hemodynamically stable, hemoglobin stable. - Continue Rocephin  and Flagyl  for diverticulitis - Soft diet  Given normal Hgb (has history B12 anemia due to bypass and iron  deficiency anemia currently replete) -Trend hemoglobin - Consult GI - Hold Xarelto 

## 2023-09-13 NOTE — Telephone Encounter (Signed)
 FYI Only or Action Required?: FYI only for provider.  Patient was last seen in primary care on 08/06/2023 by Edman Marsa PARAS, DO.  Called Nurse Triage reporting Rectal Bleeding.  Symptoms began several days ago.  Interventions attempted: Nothing.  Symptoms are: gradually worsening.  Triage Disposition: Go to ED Now (or PCP Triage)  Patient/caregiver understands and will follow disposition?: Yes         Copied from CRM #[id number]. Topic: Clinical - Red Word Triage >> Sep 13, 2023  4:56 PM Nathanel BROCKS wrote: Red Word that prompted transfer to Nurse Triage:   Pt has hernia and he believes it has ruptured, He has been bleeding for 5 days and is painful. Please advise.          Reason for Disposition  Taking Coumadin (warfarin) or other strong blood thinner, or known bleeding disorder (e.g., thrombocytopenia)  Answer Assessment - Initial Assessment Questions 1. APPEARANCE of BLOOD: What color is it? Is it passed separately, on the surface of the stool, or mixed in with the stool?      Bright red with clots  2. AMOUNT: How much blood was passed?      Entire bowl turns red, sometimes uncontrollable  3. FREQUENCY: How many times has blood been passed with the stools?      At least 10 times today  4. ONSET: When was the blood first seen in the stools? (Days or weeks)      4 days ago  7. RECURRENT SYMPTOMS: Have you had blood in your stools before? If Yes, ask: When was the last time? and What happened that time?      Minor bleeding from hemorrhoids in the past  8. BLOOD THINNERS: Do you take any blood thinners? (e.g., aspirin , clopidogrel / Plavix, coumadin, heparin). Notes: Other strong blood thinners include: Arixtra (fondaparinux), Eliquis  (apixaban ), Pradaxa (dabigatran), and Xarelto  (rivaroxaban ).     Xarelto   9. OTHER SYMPTOMS: Do you have any other symptoms?  (e.g., abdomen pain, vomiting, dizziness, fever)     Left sided abdominal  pain  Protocols used: Rectal Bleeding-A-AH

## 2023-09-13 NOTE — Assessment & Plan Note (Addendum)
 BP normal - Hold Lasix  - Continue benazepril , amlodipine 

## 2023-09-13 NOTE — ED Notes (Signed)
 ED TO INPATIENT HANDOFF REPORT  ED Nurse Name and Phone #: [phone]  S Name/Age/Gender [name] Newer 68 y.o. male Room/Bed: ED10A/ED10A  Code Status   Code Status: Prior  Home/SNF/Other Home Patient oriented to: self, place, time, and situation Is this baseline? Yes   Triage Complete: Triage complete  Chief Complaint Acute diverticulitis [K57.92]  Triage Note Patient to ED via POV for rectal bleeding x4 days. States he has hemorrhoids but this is not his normal. Also having increased hernia pain. States he took a hydrocodone  PTA. On blood thinners.  Blue top sent to lab if needed.   Allergies Allergies  Allergen Reactions   Bupivacaine  Liposome Palpitations    Pt was given this for shoulder surgery in 2023 and states heart was pounding    Level of Care/Admitting Diagnosis ED Disposition     ED Disposition  Admit   Condition  --   Comment  Hospital Area: [facility] [[id number]]  Level of Care: Telemetry Medical [104]  Covid Evaluation: Asymptomatic - no recent exposure (last 10 days) testing not required  Diagnosis: Acute diverticulitis [8807154]  Admitting Physician: JONEL LONNI SQUIBB [[id number]]  Attending Physician: JONEL LONNI SQUIBB [[id number]]  For patients discharging to extended facilities (i.e. SNF, AL, group homes or LTAC) initiate:: Discharge to SNF/Facility Placement COVID-19 Lab Testing Protocol          B Medical/Surgery History Past Medical History:  Diagnosis Date   (HFpEF) heart failure with preserved ejection fraction (HCC)    a.) TTE 12/26/2013: EF 45-50%, mild ant and antsept HK. mild MR. Mod dil LA. nl RV fxn. Rhythm was Afib; b.) TTE 07/04/2019: EF 55%, mid-apical anteroseptal HK, mild MAC, mild Ao sclerosis, G2DD, RVSP 45.3; c.) TTE 06/02/2022: EF 55-60%, no RWMAs, sev LAE, RVSP 37.9, mild-mod MR, mild AR, AoV sclerosis without stenosis, asc Ao 38 mm   Anxiety    Aortic atherosclerosis (HCC)    Cardiomyopathy (in  setting of Afib)    a.) TTE 12/26/2013: EF 45-50%; b.) TTE 07/04/2019: EF 55%; c.) TTE 06/02/2022: EF 55-60%   Chronic pain syndrome    a.) followed by pain management   Chronic, continuous use of opioids    a.) on COT; followed by pain management   Coronary artery disease 11/06/2014   a.) cCTA 11/06/2014: Ca2+ = 224 (74th %ile; LAD distribution)   Diverticulosis    DJD (degenerative joint disease) of knee    Former smoker    History of hiatal hernia    History of kidney stones 2012   Hyperlipidemia    Hyperplastic colon polyp    Hypertension    Inguinal hernia, left    Internal hemorrhoids    Intervertebral disc disorder with radiculopathy of lumbosacral region    Lesion of bone of lumbosacral spine    L5   Long term current use of amiodarone     Lower extremity edema    Mixed hyperlipidemia    Morbid obesity (HCC)    Multiple falls    Myalgia due to statin    On rivaroxaban  therapy    Osteoarthritis    PAF (paroxysmal atrial fibrillation) (HCC)    a.) CHA2DS2VASc = 4 (age, HTN, CHF, vascular disease) as of 02/19/23;  b.) s/p DCCV 03/13/14 (200 J), 05/15/19 (150 J x 1, 200 J x2), 05/19/2022 (150 J x1, 200 J x2; pads changed to ant/lat postion with additional 200 J x 2 -> did not convert), 07/25/22 (200 J), 09/21/2022 (200 J); c.) s/p PVI  ablation 11/17/2022; d.) rate/rhythm maintained on amiodarone ; chronically anticoagulated on rivaroxaban    Pernicious anemia    Prostate cancer (HCC)    Pulmonary nodule, right    a. 10/2014 Cardiac CTA: 7mm RLL nodule; b. 04/2015 CT Chest: stable 7mm RLL nodule. No new nodules; 02/2017 CTA Chest: stable, benign, 7mm RLL pulm nodule.   Rotator cuff tear, left    Sleep difficulties    a.) takes melatonin + trazodone  PRN   SVT (supraventricular tachycardia) (HCC)    Tubular adenoma of colon    Past Surgical History:  Procedure Laterality Date   ATRIAL FIBRILLATION ABLATION N/A 11/17/2022   Procedure: ATRIAL FIBRILLATION ABLATION; Location:  [facility]; Surgeon: Gehi, Anil, MD   BICEPT TENODESIS Right 09/27/2021   Procedure: Right reverse shoulder arthroplasty, biceps tenodesis;  Surgeon: Tobie Priest, MD;  Location: [facility];  Service: Orthopedics;  Laterality: Right;   CARDIOVERSION N/A 05/15/2019   Procedure: CARDIOVERSION;  Surgeon: Perla Evalene PARAS, MD;  Location: [facility];  Service: Cardiovascular;  Laterality: N/A;   CARDIOVERSION N/A 03/13/2014   Procedure: CARDIOVERSION; Location: [facility]; Surgeon: Evalene Perla, MD   CARDIOVERSION N/A 03/24/2022   Procedure: CARDIOVERSION;  Surgeon: Perla Evalene PARAS, MD;  Location: [facility];  Service: Cardiovascular;  Laterality: N/A;   CARDIOVERSION N/A 05/19/2022   Procedure: CARDIOVERSION;  Surgeon: Perla Evalene PARAS, MD;  Location: [facility];  Service: Cardiovascular;  Laterality: N/A;   CARDIOVERSION N/A 07/25/2022   Procedure: CARDIOVERSION;  Surgeon: Fernande Elspeth BROCKS, MD;  Location: [facility];  Service: Cardiovascular;  Laterality: N/A;   CARDIOVERSION N/A 09/21/2022   Procedure: CARDIOVERSION;  Surgeon: Fernande Elspeth BROCKS, MD;  Location: [facility];  Service: Cardiovascular;  Laterality: N/A;   CARPAL TUNNEL RELEASE Right 12/23/2014   Procedure: CARPAL TUNNEL RELEASE;  Surgeon: Kayla Pinal, MD;  Location: [facility];  Service: Orthopedics;  Laterality: Right;   CARPAL TUNNEL RELEASE Left 01/06/2015   Procedure: CARPAL TUNNEL RELEASE;  Surgeon: Kayla Pinal, MD;  Location: [facility];  Service: Orthopedics;  Laterality: Left;   COLONOSCOPY WITH PROPOFOL  N/A 10/08/2017   Procedure: COLONOSCOPY WITH PROPOFOL ;  Surgeon: Unk Corinn Skiff, MD;  Location: [facility];  Service: Gastroenterology;  Laterality: N/A;   COLONOSCOPY WITH PROPOFOL  N/A 06/07/2022   Procedure: COLONOSCOPY WITH PROPOFOL ;  Surgeon: Unk Corinn Skiff, MD;  Location: [facility];  Service: Gastroenterology;  Laterality: N/A;   ENTEROSCOPY  06/07/2022   Procedure: ENTEROSCOPY;  Surgeon: Unk Corinn Skiff, MD;   Location: [facility] ENDOSCOPY;  Service: Gastroenterology;;   ESOPHAGOGASTRODUODENOSCOPY (EGD) WITH PROPOFOL  N/A 06/07/2022   Procedure: ESOPHAGOGASTRODUODENOSCOPY (EGD) WITH PROPOFOL ;  Surgeon: Unk Corinn Skiff, MD;  Location: [facility] ENDOSCOPY;  Service: Gastroenterology;  Laterality: N/A;   GASTRIC BYPASS  01/17/2000   KNEE SURGERY Right    knee trauma x3   prostate seeding     REVERSE SHOULDER ARTHROPLASTY Right 09/27/2021   Procedure: Right reverse shoulder arthroplasty, biceps tenodesis;  Surgeon: Tobie Priest, MD;  Location: [facility];  Service: Orthopedics;  Laterality: Right;   REVERSE SHOULDER ARTHROPLASTY Left 02/20/2023   Procedure: Left reverse shoulder arthroplasty, biceps tenodesis;  Surgeon: Tobie Priest, MD;  Location: [facility];  Service: Orthopedics;  Laterality: Left;   SHOULDER ARTHROSCOPY WITH ROTATOR CUFF REPAIR AND OPEN BICEPS TENODESIS Left 12/27/2021   Procedure: Left shoulder arthroscopic cuff repair (supraspinatus and subscapularis) with Regeneten Patch application;  Surgeon: Tobie Priest, MD;  Location: [facility];  Service: Orthopedics;  Laterality: Left;     A IV Location/Drains/Wounds Patient Lines/Drains/Airways Status  Active Line/Drains/Airways     Name Placement date Placement time Site Days   Peripheral IV 09/13/23 18 G Anterior;Distal;Right;Upper Arm 09/13/23  1938  Arm  less than 1            Intake/Output Last 24 hours  Intake/Output Summary (Last 24 hours) at 09/13/2023 2054 Last data filed at 09/13/2023 2053 Gross per 24 hour  Intake 500 ml  Output --  Net 500 ml    Labs/Imaging Results for orders placed or performed during the hospital encounter of 09/13/23 (from the past 48 hours)  Comprehensive metabolic panel     Status: Abnormal   Collection Time: 09/13/23  6:40 PM  Result Value Ref Range   Sodium 140 135 - 145 mmol/L   Potassium 4.6 3.5 - 5.1 mmol/L   Chloride 104 98 - 111 mmol/L   CO2 24 22 - 32 mmol/L   Glucose, Bld 115 (H) 70 -  99 mg/dL    Comment: Glucose reference range applies only to samples taken after fasting for at least 8 hours.   BUN 16 8 - 23 mg/dL   Creatinine, Ser 8.95 0.61 - 1.24 mg/dL   Calcium  8.9 8.9 - 10.3 mg/dL   Total Protein 6.5 6.5 - 8.1 g/dL   Albumin 4.0 3.5 - 5.0 g/dL   AST 17 15 - 41 U/L   ALT 12 0 - 44 U/L   Alkaline Phosphatase 73 38 - 126 U/L   Total Bilirubin 0.9 0.0 - 1.2 mg/dL   GFR, Estimated >39 >39 mL/min    Comment: (NOTE) Calculated using the CKD-EPI Creatinine Equation (2021)    Anion gap 12 5 - 15    Comment: Performed at [facility] [address] [place], [place] [address]  CBC     Status: Abnormal   Collection Time: 09/13/23  6:40 PM  Result Value Ref Range   WBC 14.9 (H) 4.0 - 10.5 K/uL   RBC 4.95 4.22 - 5.81 MIL/uL   Hemoglobin 14.1 13.0 - 17.0 g/dL   HCT 55.8 60.9 - 47.9 %   MCV 89.1 80.0 - 100.0 fL   MCH 28.5 26.0 - 34.0 pg   MCHC 32.0 30.0 - 36.0 g/dL   RDW 86.6 88.4 - 84.4 %   Platelets 200 150 - 400 K/uL   nRBC 0.0 0.0 - 0.2 %    Comment: Performed at [facility] : [facility], [address], [place], [place] [address]  Type and screen [facility]     Status: None   Collection Time: 09/13/23  6:40 PM  Result Value Ref Range   ABO/RH(D) O NEG    Antibody Screen NEG    Sample Expiration      09/16/2023,2359 Performed at [facility], [address] [place], [place] [address]   Lactic acid, plasma     Status: None   Collection Time: 09/13/23  7:43 PM  Result Value Ref Range   Lactic Acid, Venous 1.2 0.5 - 1.9 mmol/L    Comment: Performed at [facility], [address] [place], [place] [address]  Protime-INR     Status: Abnormal   Collection Time: 09/13/23  7:43 PM  Result Value Ref Range   Prothrombin Time 16.1 (H) 11.4 - 15.2 seconds   INR 1.2 0.8 - 1.2    Comment: (NOTE) INR goal varies based on device and disease states. Performed at [facility], [address],  [place], [place] [address]   APTT  Status: Abnormal   Collection Time: 09/13/23  7:43 PM  Result Value Ref Range   aPTT 37 (H) 24 - 36 seconds    Comment:        IF BASELINE aPTT IS ELEVATED, SUGGEST PATIENT RISK ASSESSMENT BE USED TO DETERMINE APPROPRIATE ANTICOAGULANT THERAPY. Performed at [facility], [address] [place], [place] [address]    *Note: Due to a large number of results and/or encounters for the requested time period, some results have not been displayed. A complete set of results can be found in Results Review.   CT Angio Abd/Pel W and/or Wo Contrast Result Date: 09/13/2023 CLINICAL DATA:  Bright red blood per rectum lungs are also. Rectal bleeding for 4 days. EXAM: CTA ABDOMEN AND PELVIS WITHOUT AND WITH CONTRAST TECHNIQUE: Multidetector CT imaging of the abdomen and pelvis was performed using the standard protocol during bolus administration of intravenous contrast. Multiplanar reconstructed images and MIPs were obtained and reviewed to evaluate the vascular anatomy. RADIATION DOSE REDUCTION: This exam was performed according to the departmental dose-optimization program which includes automated exposure control, adjustment of the mA and/or kV according to patient size and/or use of iterative reconstruction technique. CONTRAST:  OMNIPAQUE  IOHEXOL  350 MG/ML SOLN COMPARISON:  05/15/2023 FINDINGS: VASCULAR Aorta: Calcification of the aorta. No aneurysm or dissection. No significant stenosis. Celiac: Patent without evidence of aneurysm, dissection, vasculitis or significant stenosis. SMA: Patent without evidence of aneurysm, dissection, vasculitis or significant stenosis. Renals: Both renal arteries are patent without evidence of aneurysm, dissection, vasculitis, fibromuscular dysplasia or significant stenosis. IMA: Patent without evidence of aneurysm, dissection, vasculitis or significant stenosis. Inflow: Patent without evidence of aneurysm, dissection, vasculitis  or significant stenosis. Proximal Outflow: Bilateral common femoral and visualized portions of the superficial and profunda femoral arteries are patent without evidence of aneurysm, dissection, vasculitis or significant stenosis. Veins: No obvious venous abnormality within the limitations of this arterial phase study. Review of the MIP images confirms the above findings. NON-VASCULAR Lower chest: 5 mm pulmonary nodule in the right lung base. No change since previous studies dating back to 03/13/2017. Long-term stability suggests benign etiology and no imaging follow-up is indicated. Hepatobiliary: No focal liver lesions. Cholelithiasis without wall thickening or stranding. No bile duct dilatation. Pancreas: Unremarkable. No pancreatic ductal dilatation or surrounding inflammatory changes. Spleen: Normal in size without focal abnormality. Adrenals/Urinary Tract: Adrenal glands are unremarkable. Kidneys are normal, without renal calculi, focal lesion, or hydronephrosis. Bladder is unremarkable. Stomach/Bowel: Stomach, small bowel, and colon are not abnormally distended. Diverticulosis of the sigmoid colon. Fat stranding around the junction of the rectosigmoid colon with small pericolonic collection likely representing enlarged diverticula and measuring about 1.1 cm. This is consistent with acute diverticulitis. Wall thickening this area is probably inflammatory but follow-up after resolution of acute process is recommended to exclude underlying colon cancer. No discrete abscess. No intraluminal contrast extravasation or contrast pooling. No focal site of acute gastrointestinal bleeding is identified. Postoperative changes consistent with gastric bypass. Lymphatic: Scattered retroperitoneal and pelvic lymph nodes are not pathologically enlarged. Reproductive: Prostate seed implants.  Prostate gland is atrophic. Other: No free air or free fluid in the abdomen. Abdominal wall musculature appears intact. Musculoskeletal:  Degenerative changes throughout the spine. Spondylolysis with minimal spondylolisthesis at L5-S1. IMPRESSION: VASCULAR 1. Aortic atherosclerosis. 2. No evidence of aneurysm or dissection or significant stenosis involving the abdominal aorta or visceral branches. NON-VASCULAR 1. Colonic diverticulosis with prominent diverticulum and surrounding stranding with wall thickening at the level of the rectosigmoid  colon. This most likely represents acute diverticulitis. Follow-up after resolution of acute process is recommended to exclude underlying colon neoplasm given the focal wall thickening. No abscess. 2. Cholelithiasis without evidence of acute cholecystitis. Electronically Signed   By: Elsie Gravely M.D.   On: 09/13/2023 20:09    Pending Labs Unresulted Labs (From admission, onward)     Start     Ordered   09/13/23 1913  Blood culture (routine x 2)  BLOOD CULTURE X 2,   STAT      09/13/23 1913            Vitals/Pain Today's Vitals   09/13/23 1837 09/13/23 1838 09/13/23 1914  BP:  132/89   Pulse:  (!) 113   Resp:  17   Temp:  100.1 F (37.8 C)   TempSrc:  Oral   SpO2:  98% 100%  Weight: (!) 158.8 kg    Height: 6' (1.829 m)    PainSc: 4       Isolation Precautions No active isolations  Medications Medications  piperacillin -tazobactam (ZOSYN ) IVPB 3.375 g (3.375 g Intravenous New Bag/Given 09/13/23 1940)  sodium chloride  0.9 % bolus 500 mL (0 mLs Intravenous Stopped 09/13/23 2053)  iohexol  (OMNIPAQUE ) 350 MG/ML injection 125 mL (125 mLs Intravenous Contrast Given 09/13/23 1948)    Mobility walks     Focused Assessments     R Recommendations: See Admitting Provider Note  Report given to:   Additional Notes:

## 2023-09-13 NOTE — ED Triage Notes (Addendum)
 Patient to ED via POV for rectal bleeding x4 days. States he has hemorrhoids but this is not his normal. Also having increased hernia pain. States he took a hydrocodone  PTA. On blood thinners.  Blue top sent to lab if needed.

## 2023-09-13 NOTE — Assessment & Plan Note (Signed)
-   Continue Lipitor

## 2023-09-13 NOTE — Assessment & Plan Note (Signed)
 BMI of 47, complicates care

## 2023-09-13 NOTE — Assessment & Plan Note (Signed)
 Appears euvolemic - Hold Lasix 

## 2023-09-13 NOTE — Assessment & Plan Note (Signed)
 Sounds regular on exam. - Hold Xarelto 

## 2023-09-13 NOTE — ED Provider Notes (Signed)
 [facility] Provider Note    Event Date/Time   First MD Initiated Contact with Patient 09/13/23 1905     (approximate)   History   Rectal Bleeding   HPI  [name] is a 68 y.o. male hypertension, hyperlipidemia,fib, HFmrEF, CAD on xarelto  who presents with 4 days of worsening abdominal pain and bright red blood per rectum.  Patient reports that every time he uses the restroom he has a copious amount of blood.  Today he had incontinence with a brief for blood.  He denies any syncopal events.  He does have a history of hemorrhoids but this is not consistent with bleeding previously.  He denies any fevers or chills.  His wife presents with him who contributes to history      Physical Exam   Triage Vital Signs: ED Triage Vitals  Encounter Vitals Group     BP 09/13/23 1838 132/89     Girls Systolic BP Percentile --      Girls Diastolic BP Percentile --      Boys Systolic BP Percentile --      Boys Diastolic BP Percentile --      Pulse Rate 09/13/23 1838 (!) 113     Resp 09/13/23 1838 17     Temp 09/13/23 1838 100.1 F (37.8 C)     Temp Source 09/13/23 1838 Oral     SpO2 09/13/23 1838 98 %     Weight 09/13/23 1837 (!) 350 lb (158.8 kg)     Height 09/13/23 1837 6' (1.829 m)     Head Circumference --      Peak Flow --      Pain Score 09/13/23 1837 4     Pain Loc --      Pain Education --      Exclude from Growth Chart --     Most recent vital signs: Vitals:   09/13/23 1914 09/13/23 2155  BP:  131/88  Pulse:  95  Resp:  18  Temp:  98.5 F (36.9 C)  SpO2: 100% 100%    Nursing Triage Note reviewed. Vital signs reviewed and patients oxygen saturation is normoxic  General: Patient is well nourished, well developed, awake and alert,Head: Normocephalic and atraumatic Eyes: Normal inspection, extraocular muscles intact, no conjunctival pallor Ear, nose, throat: Normal external exam Neck: Normal range of motion Respiratory: Patient is in no  respiratory distress, lungs CTAB Cardiovascular: Patient is not tachycardic, RRR without murmur appreciated GI: Abd soft, mild tenderness to palpation generally with no guarding or rebound  Back: Normal inspection of the back with good strength and range of motion throughout all ext Extremities: pulses intact with good cap refills, no LE pitting edema or calf tenderness Neuro: The patient is alert and oriented to person, place, and time, appropriately conversive, with 5/5 bilat UE/LE strength, no gross motor or sensory defects noted. Coordination appears to be adequate. Skin: Warm, dry, and intact Psych: normal mood and affect, no SI or HI  Rectal: There are external hemorrhoids however these are not thrombosed and do not appear to be the source of bleeding.  There is bright red blood per rectum  ED Results / Procedures / Treatments   Labs (all labs ordered are listed, but only abnormal results are displayed) Labs Reviewed  COMPREHENSIVE METABOLIC PANEL WITH GFR - Abnormal; Notable for the following components:      Result Value   Glucose, Bld 115 (*)    All other components within  normal limits  CBC - Abnormal; Notable for the following components:   WBC 14.9 (*)    All other components within normal limits  PROTIME-INR - Abnormal; Notable for the following components:   Prothrombin Time 16.1 (*)    All other components within normal limits  APTT - Abnormal; Notable for the following components:   aPTT 37 (*)    All other components within normal limits  CULTURE, BLOOD (ROUTINE X 2)  CULTURE, BLOOD (ROUTINE X 2)  LACTIC ACID, PLASMA  HIV ANTIBODY (ROUTINE TESTING W REFLEX)  CBC  BASIC METABOLIC PANEL WITH GFR  TYPE AND SCREEN     EKG None  RADIOLOGY CTA abd/pelvis: Consistent with diverticulitis without evidence of acute extravasation on my independent review interpretation radiologist agrees    PROCEDURES:  Critical Care performed: No  Procedures   MEDICATIONS  ORDERED IN ED: Medications  HYDROcodone -acetaminophen  (NORCO) 7.5-325 MG per tablet 1 tablet (1 tablet Oral Given 09/13/23 2321)  amLODipine  (NORVASC ) tablet 5 mg (has no administration in time range)  atorvastatin  (LIPITOR) tablet 10 mg (has no administration in time range)  benazepril  (LOTENSIN ) tablet 40 mg (has no administration in time range)  busPIRone  (BUSPAR ) tablet 10 mg (10 mg Oral Given 09/13/23 2322)  DULoxetine  (CYMBALTA ) DR capsule 60 mg (has no administration in time range)  traZODone  (DESYREL ) tablet 150 mg (150 mg Oral Given 09/13/23 2322)  melatonin tablet 2.5 mg (2.5 mg Oral Given 09/13/23 2322)  cyclobenzaprine  (FLEXERIL ) tablet 10 mg (10 mg Oral Given 09/13/23 2321)  acetaminophen  (TYLENOL ) tablet 650 mg (has no administration in time range)    Or  acetaminophen  (TYLENOL ) suppository 650 mg (has no administration in time range)  ondansetron  (ZOFRAN ) tablet 4 mg (has no administration in time range)    Or  ondansetron  (ZOFRAN ) injection 4 mg (has no administration in time range)  cefTRIAXone  (ROCEPHIN ) 2 g in sodium chloride  0.9 % 100 mL IVPB (has no administration in time range)  metroNIDAZOLE  (FLAGYL ) IVPB 500 mg (has no administration in time range)  sodium chloride  0.9 % bolus 500 mL (0 mLs Intravenous Stopped 09/13/23 2053)  piperacillin -tazobactam (ZOSYN ) IVPB 3.375 g (3.375 g Intravenous New Bag/Given 09/13/23 1940)  iohexol  (OMNIPAQUE ) 350 MG/ML injection 125 mL (125 mLs Intravenous Contrast Given 09/13/23 1948)     IMPRESSION / MDM / ASSESSMENT AND PLAN / ED COURSE                                Differential diagnosis includes, but is not limited to, acute anemia, malignancy, internal hemorrhoid, colitis, diverticulitis  ED course: Patient presents and he appears well-perfused and abdomen demonstrates no evidence of peritonitis.  He is not profoundly anemic requiring a transfusion.  He does have evidence of bright red blood per rectum that does appear to be coming  from a lower GI bleed.  CT angio abdomen pelvis consistent with diverticulitis he was given a dose of Zosyn .  Given that he is on a blood thinner, case called into hospitalist for admission for continued observation and possible GI consult   Clinical Course as of 09/14/23 0025  Thu Sep 13, 2023  1906 Temp: 100.1 F (37.8 C) Technically might meet sepsis criteria [HD]  1911 WBC(!): 14.9 Does have a leukocytosis [HD]  1925 Hemoglobin: 14.1 No profound anemia [HD]  1925 Comprehensive metabolic panel(!) No electrolyte derangements [HD]  2015 CT Angio Abd/Pel W and/or Wo Contrast Consistent with acute  diverticulitis [HD]    Clinical Course User Index [HD] Nicholaus Rolland BRAVO, MD   Data(2/3 categories following were performed): I reviewed or ordered at least three unique tests, external notes, and/or the history required an independent historian as one of the three requirements as following: At least 3 labs/imaging studies were obtained and/or reviewed. AND  I discussed the management of the patient with the following external physician or qualified healthcare provider: Admitting physician  Risk: This patient has a high risk of morbidity due to further diagnostic testing or treatment. Rationale: Decision made regarding admission  Admit Level 5 - Labs/Rads, Admit, Consult:  Suggested E/M Coding Level: 5, 99285  This level has been selected based on the September 19, 2021 CPT guidelines for E/M codes in the Emergency Department based on 2/3 of the CoPA, Data, and Risk.    FINAL CLINICAL IMPRESSION(S) / ED DIAGNOSES   Final diagnoses:  BRBPR (bright red blood per rectum)  Diverticulitis  Anticoagulated     Rx / DC Orders   ED Discharge Orders     None        Note:  This document was prepared using Dragon voice recognition software and may include unintentional dictation errors.   Nicholaus Rolland BRAVO, MD 09/14/23 [phone]

## 2023-09-13 NOTE — Assessment & Plan Note (Addendum)
-   Continue home Norco 7.5-325 and Flexeril

## 2023-09-14 ENCOUNTER — Other Ambulatory Visit: Payer: Self-pay

## 2023-09-14 DIAGNOSIS — K625 Hemorrhage of anus and rectum: Secondary | ICD-10-CM | POA: Diagnosis not present

## 2023-09-14 DIAGNOSIS — K5792 Diverticulitis of intestine, part unspecified, without perforation or abscess without bleeding: Secondary | ICD-10-CM | POA: Diagnosis not present

## 2023-09-14 DIAGNOSIS — K922 Gastrointestinal hemorrhage, unspecified: Secondary | ICD-10-CM | POA: Diagnosis not present

## 2023-09-14 DIAGNOSIS — Z7901 Long term (current) use of anticoagulants: Secondary | ICD-10-CM | POA: Diagnosis not present

## 2023-09-14 LAB — CBC
HCT: 39.7 % (ref 39.0–52.0)
Hemoglobin: 13.1 g/dL (ref 13.0–17.0)
MCH: 29.2 pg (ref 26.0–34.0)
MCHC: 33 g/dL (ref 30.0–36.0)
MCV: 88.4 fL (ref 80.0–100.0)
Platelets: 189 K/uL (ref 150–400)
RBC: 4.49 MIL/uL (ref 4.22–5.81)
RDW: 13.2 % (ref 11.5–15.5)
WBC: 12 K/uL — ABNORMAL HIGH (ref 4.0–10.5)
nRBC: 0 % (ref 0.0–0.2)

## 2023-09-14 LAB — BASIC METABOLIC PANEL WITH GFR
Anion gap: 9 (ref 5–15)
BUN: 12 mg/dL (ref 8–23)
CO2: 25 mmol/L (ref 22–32)
Calcium: 8.5 mg/dL — ABNORMAL LOW (ref 8.9–10.3)
Chloride: 105 mmol/L (ref 98–111)
Creatinine, Ser: 0.91 mg/dL (ref 0.61–1.24)
GFR, Estimated: >60 mL/min (ref >60)
Glucose, Bld: 113 mg/dL — ABNORMAL HIGH (ref 70–99)
Potassium: 4 mmol/L (ref 3.5–5.1)
Sodium: 139 mmol/L (ref 135–145)

## 2023-09-14 LAB — HIV ANTIBODY (ROUTINE TESTING W REFLEX): HIV Screen 4th Generation wRfx: NONREACTIVE

## 2023-09-14 MED ORDER — CIPROFLOXACIN HCL 500 MG PO TABS
500.0000 mg | ORAL_TABLET | Freq: Two times a day (BID) | ORAL | 0 refills | Status: AC
  Filled 2023-09-14: qty 14, 7d supply, fill #0

## 2023-09-14 MED ORDER — METRONIDAZOLE 500 MG PO TABS
500.0000 mg | ORAL_TABLET | Freq: Three times a day (TID) | ORAL | 0 refills | Status: AC
  Filled 2023-09-14: qty 21, 7d supply, fill #0

## 2023-09-14 NOTE — Plan of Care (Signed)
  Problem: Health Behavior/Discharge Planning: Goal: Ability to manage health-related needs will improve Outcome: Progressing   Problem: Clinical Measurements: Goal: Ability to maintain clinical measurements within normal limits will improve Outcome: Progressing Goal: Will remain free from infection Outcome: Progressing Goal: Cardiovascular complication will be avoided Outcome: Progressing   Problem: Nutrition: Goal: Adequate nutrition will be maintained Outcome: Progressing

## 2023-09-14 NOTE — TOC Initial Note (Signed)
 Transition of Care [facility]) - Initial/Assessment Note    Patient Details  Name: [name] MRN: [id number] Date of Birth: [birth year]  [facility]) CM/SW Contact:    Dalia GORMAN Fuse, RN Phone Number: 09/14/2023, 10:23 AM  Clinical Narrative:                 Patient is from home independently. He is receiving IV abx for GI bleed. CT abdomen/pelvis showed sigmoid diverticulitis. Not TOC needs, please outreach if needs identified.        Patient Goals and CMS Choice            Expected Discharge Plan and Services                                              Prior Living Arrangements/Services                       Activities of Daily Living   ADL Screening (condition at time of admission) Independently performs ADLs?: Yes (appropriate for developmental age) Is the patient deaf or have difficulty hearing?: No Does the patient have difficulty seeing, even when wearing glasses/contacts?: No Does the patient have difficulty concentrating, remembering, or making decisions?: No  Permission Sought/Granted                  Emotional Assessment              Admission diagnosis:  Diverticulitis [K57.92] BRBPR (bright red blood per rectum) [K62.5] Anticoagulated [Z79.01] Acute diverticulitis [K57.92] Patient Active Problem List   Diagnosis Date Noted   Acute diverticulitis 09/13/2023   Chronic diastolic CHF (congestive heart failure) (HCC) 09/13/2023   Symptomatic anemia 04/26/2022   Chronic pain of both knees 03/30/2022   Bilateral primary osteoarthritis of knee 03/30/2022   Shortness of breath 03/24/2022   Vitamin D  deficiency 02/24/2022   S/p reverse total shoulder arthroplasty 09/27/2021   Lesion of bone of lumbosacral spine (L5) 05/12/2021   Localized osteoarthritis of shoulder regions, bilateral 03/29/2021   Chronic pain of both shoulders 03/29/2021   Drug-induced myopathy 02/21/2021   Trigger finger of right hand  11/15/2020   Prostate cancer (HCC) 07/26/2020   Insomnia 08/01/2019   Primary osteoarthritis of both wrists 02/17/2019   Trigger middle finger of left hand 02/17/2019   Chronic pain syndrome 02/17/2019   Chronic radicular lumbar pain 10/08/2018   Lumbar radiculopathy 10/08/2018   Lumbar degenerative disc disease 10/08/2018   Lumbar facet arthropathy 10/08/2018   Lumbar facet joint syndrome 10/08/2018   Intervertebral disc disorder with radiculopathy of lumbosacral region    Osteoarthritis of knee 11/30/2017   Osteoarthritis of wrist 11/30/2017   Pernicious anemia 05/22/2016   Paroxysmal atrial fibrillation 12/20/2015   Carpal tunnel syndrome 11/16/2014   DJD (degenerative joint disease) of knee 10/27/2014   Bilateral carpal tunnel syndrome 10/27/2014   Morbid obesity with BMI of 45.0-49.9, adult (HCC) 10/27/2014   Mixed hyperlipidemia 10/27/2014   H/O gastric bypass 03/20/2014   Hyperkalemia 03/20/2014   History of prostate cancer 12/26/2013   Essential hypertension 12/26/2013   Encounter for anticoagulation discussion and counseling 12/26/2013   Rectal bleeding 12/26/2013   PCP:  Edman Marsa JINNY, DO Pharmacy:   [facility] [address] [facility] - [place], [place] - [address] [place]  [place] [address] Phone: [phone] Fax: [phone]     Social Drivers of Health (SDOH) Social History: SDOH Screenings   Food Insecurity: No Food Insecurity (09/13/2023)  Housing: Low Risk  (09/13/2023)  Transportation Needs: No Transportation Needs (09/13/2023)  Utilities: Not At Risk (09/13/2023)  Alcohol  Screen: Low Risk  (08/01/2022)  Depression (PHQ2-9): Low Risk  (08/20/2023)  Recent Concern: Depression (PHQ2-9) - Medium Risk (08/06/2023)  Financial Resource Strain: Low Risk  (08/06/2023)  Physical Activity: Inactive (08/06/2023)  Social Connections: Socially Integrated (09/13/2023)  Stress: No Stress Concern Present (08/06/2023)  Tobacco  Use: Medium Risk (09/13/2023)   SDOH Interventions:     Readmission Risk Interventions     No data to display

## 2023-09-14 NOTE — Care Management Obs Status (Signed)
 MEDICARE OBSERVATION STATUS NOTIFICATION   Patient Details  Name: XENG KUCHER MRN: [id number] Date of Birth: [birth year]   Medicare Observation Status Notification Given:  Yes (patient did not want a copy)    Rojelio SHAUNNA Rattler 09/14/2023, 10:41 AM

## 2023-09-14 NOTE — Consult Note (Signed)
 Inpatient Consultation   Patient ID: [name] is a 68 y.o. male.  Requesting Provider: Lonni Dalton, MD  Date of Admission: 09/13/2023  Date of Consult: 09/14/23   Reason for Consultation: Hematochezia, diverticulitis  Patient's Chief Complaint:   Chief Complaint  Patient presents with   Rectal Bleeding    68 year old Caucasian male with roux en y gastric bypass, HFpEF, prostate cancer status post brachytherapy, A-fib status post cardioversion on Xarelto , hypertension who presents with bright red blood per rectum.  Patient reports that he has had intermittent bleeding for 5 months now.  In the last 5 days he started to pass more blood passing red clots.  He had several bloody bowel movements at home and presented to the emergency department.  Upon check his hemoglobin was at baseline in comparison to a month ago at 14.  This morning is at 13 after IV fluid resuscitation and patient reports continued dark red/maroon stool.  He has noted some bright red blood per rectum.  CTA was performed overnight and did not show active bleeding but did show sigmoid diverticulitis.  He is currently on antibiotics and tolerating clear liquids.  He has some left lower quadrant pain.  No nausea or vomiting.  On xarelto - currently held for GI Bleed  Denies NSAIDs, Anti-plt agents Denies family history of gastrointestinal disease and malignancy Previous Endoscopies: 05/2022 EGD, [facility], Small bowel enteroscopy- diverticulosis and IH; roux en y anatomy; otherwise normal    Past Medical History:  Diagnosis Date   (HFpEF) heart failure with preserved ejection fraction (HCC)    a.) TTE 12/26/2013: EF 45-50%, mild ant and antsept HK. mild MR. Mod dil LA. nl RV fxn. Rhythm was Afib; b.) TTE 07/04/2019: EF 55%, mid-apical anteroseptal HK, mild MAC, mild Ao sclerosis, G2DD, RVSP 45.3; c.) TTE 06/02/2022: EF 55-60%, no RWMAs, sev LAE, RVSP 37.9, mild-mod MR, mild AR, AoV sclerosis without stenosis,  asc Ao 38 mm   Anxiety    Aortic atherosclerosis (HCC)    Cardiomyopathy (in setting of Afib)    a.) TTE 12/26/2013: EF 45-50%; b.) TTE 07/04/2019: EF 55%; c.) TTE 06/02/2022: EF 55-60%   Chronic pain syndrome    a.) followed by pain management   Chronic, continuous use of opioids    a.) on COT; followed by pain management   Coronary artery disease 11/06/2014   a.) cCTA 11/06/2014: Ca2+ = 224 (74th %ile; LAD distribution)   Diverticulosis    DJD (degenerative joint disease) of knee    Former smoker    History of hiatal hernia    History of kidney stones 2012   Hyperlipidemia    Hyperplastic colon polyp    Hypertension    Inguinal hernia, left    Internal hemorrhoids    Intervertebral disc disorder with radiculopathy of lumbosacral region    Lesion of bone of lumbosacral spine    L5   Long term current use of amiodarone     Lower extremity edema    Mixed hyperlipidemia    Morbid obesity (HCC)    Multiple falls    Myalgia due to statin    On rivaroxaban  therapy    Osteoarthritis    PAF (paroxysmal atrial fibrillation) (HCC)    a.) CHA2DS2VASc = 4 (age, HTN, CHF, vascular disease) as of 02/19/23;  b.) s/p DCCV 03/13/14 (200 J), 05/15/19 (150 J x 1, 200 J x2), 05/19/2022 (150 J x1, 200 J x2; pads changed to ant/lat postion with additional 200 J x 2 ->  did not convert), 07/25/22 (200 J), 09/21/2022 (200 J); c.) s/p PVI ablation 11/17/2022; d.) rate/rhythm maintained on amiodarone ; chronically anticoagulated on rivaroxaban    Pernicious anemia    Prostate cancer (HCC)    Pulmonary nodule, right    a. 10/2014 Cardiac CTA: 7mm RLL nodule; b. 04/2015 CT Chest: stable 7mm RLL nodule. No new nodules; 02/2017 CTA Chest: stable, benign, 7mm RLL pulm nodule.   Rotator cuff tear, left    Sleep difficulties    a.) takes melatonin + trazodone  PRN   SVT (supraventricular tachycardia) (HCC)    Tubular adenoma of colon     Past Surgical History:  Procedure Laterality Date   ATRIAL FIBRILLATION  ABLATION N/A 11/17/2022   Procedure: ATRIAL FIBRILLATION ABLATION; Location: [facility]; Surgeon: Gehi, Anil, MD   BICEPT TENODESIS Right 09/27/2021   Procedure: Right reverse shoulder arthroplasty, biceps tenodesis;  Surgeon: Tobie Priest, MD;  Location: [facility];  Service: Orthopedics;  Laterality: Right;   CARDIOVERSION N/A 05/15/2019   Procedure: CARDIOVERSION;  Surgeon: Perla Evalene PARAS, MD;  Location: [facility];  Service: Cardiovascular;  Laterality: N/A;   CARDIOVERSION N/A 03/13/2014   Procedure: CARDIOVERSION; Location: [facility]; Surgeon: Evalene Perla, MD   CARDIOVERSION N/A 03/24/2022   Procedure: CARDIOVERSION;  Surgeon: Perla Evalene PARAS, MD;  Location: [facility];  Service: Cardiovascular;  Laterality: N/A;   CARDIOVERSION N/A 05/19/2022   Procedure: CARDIOVERSION;  Surgeon: Perla Evalene PARAS, MD;  Location: [facility];  Service: Cardiovascular;  Laterality: N/A;   CARDIOVERSION N/A 07/25/2022   Procedure: CARDIOVERSION;  Surgeon: Fernande Elspeth BROCKS, MD;  Location: [facility];  Service: Cardiovascular;  Laterality: N/A;   CARDIOVERSION N/A 09/21/2022   Procedure: CARDIOVERSION;  Surgeon: Fernande Elspeth BROCKS, MD;  Location: [facility];  Service: Cardiovascular;  Laterality: N/A;   CARPAL TUNNEL RELEASE Right 12/23/2014   Procedure: CARPAL TUNNEL RELEASE;  Surgeon: Kayla Pinal, MD;  Location: [facility];  Service: Orthopedics;  Laterality: Right;   CARPAL TUNNEL RELEASE Left 01/06/2015   Procedure: CARPAL TUNNEL RELEASE;  Surgeon: Kayla Pinal, MD;  Location: [facility];  Service: Orthopedics;  Laterality: Left;   COLONOSCOPY WITH PROPOFOL  N/A 10/08/2017   Procedure: COLONOSCOPY WITH PROPOFOL ;  Surgeon: Unk Corinn Skiff, MD;  Location[facility];  Service: Gastroenterology;  Laterality: N/A;   COLONOSCOPY WITH PROPOFOL  N/A 06/07/2022   Procedure: COLONOSCOPY WITH PROPOFOL ;  Surgeon: Unk Corinn Skiff, MD;  Location: [facility];  Service: Gastroenterology;  Laterality: N/A;   ENTEROSCOPY   06/07/2022   Procedure: ENTEROSCOPY;  Surgeon: Unk Corinn Skiff, MD;  Location: [facility]COPY;  Service: Gastroenterology;;   ESOPHAGOGASTRODUODENOSCOPY (EGD) WITH PROPOFOL  N/A 06/07/2022   Procedure: ESOPHAGOGASTRODUODENOSCOPY (EGD) WITH PROPOFOL ;  Surgeon: Unk Corinn Skiff, MD;  Location: [facility];  Service: Gastroenterology;  Laterality: N/A;   GASTRIC BYPASS  01/17/2000   KNEE SURGERY Right    knee trauma x3   prostate seeding     REVERSE SHOULDER ARTHROPLASTY Right 09/27/2021   Procedure: Right reverse shoulder arthroplasty, biceps tenodesis;  Surgeon: Tobie Priest, MD;  Location: [facility];  Service: Orthopedics;  Laterality: Right;   REVERSE SHOULDER ARTHROPLASTY Left 02/20/2023   Procedure: Left reverse shoulder arthroplasty, biceps tenodesis;  Surgeon: Tobie Priest, MD;  Location: [facility];  Service: Orthopedics;  Laterality: Left;   SHOULDER ARTHROSCOPY WITH ROTATOR CUFF REPAIR AND OPEN BICEPS TENODESIS Left 12/27/2021   Procedure: Left shoulder arthroscopic cuff repair (supraspinatus and subscapularis) with Regeneten Patch application;  Surgeon: Tobie Priest, MD;  Location: [facility];  Service: Orthopedics;  Laterality: Left;  Allergies  Allergen Reactions   Bupivacaine  Liposome Palpitations    Pt was given this for shoulder surgery in 2023 and states heart was pounding    Family History  Problem Relation Age of Onset   Heart disease Father    Hypertension Father    Stroke Father    Cancer Father        prostate cancer   Cancer Sister    Arrhythmia Sister        A-fib   Arrhythmia Brother        A-fib   Cancer Brother     Social History   Tobacco Use   Smoking status: Former    Current packs/day: 0.00    Average packs/day: 1 pack/day for 10.0 years (10.0 ttl pk-yrs)    Types: Cigarettes    Start date: 02/17/1969    Quit date: 02/18/1979    Years since quitting: 44.6   Smokeless tobacco: Former    Types: Snuff  Vaping Use   Vaping status: Never Used   Substance Use Topics   Alcohol  use: Not Currently    Comment: last drink in 2021   Drug use: No     Pertinent GI related history and allergies were reviewed with the patient  Review of Systems  Constitutional:  Negative for activity change, appetite change, chills, diaphoresis, fatigue, fever and unexpected weight change.  HENT:  Negative for trouble swallowing and voice change.   Respiratory:  Negative for shortness of breath and wheezing.   Cardiovascular:  Negative for chest pain, palpitations and leg swelling.  Gastrointestinal:  Positive for abdominal pain and blood in stool. Negative for abdominal distention, anal bleeding, constipation, diarrhea, nausea and vomiting.  Musculoskeletal:  Negative for arthralgias and myalgias.  Skin:  Negative for color change and pallor.  Neurological:  Negative for dizziness, syncope and weakness.  Psychiatric/Behavioral:  Negative for confusion. The patient is not nervous/anxious.   All other systems reviewed and are negative.    Medications Home Medications No current facility-administered medications on file prior to encounter.   Current Outpatient Medications on File Prior to Encounter  Medication Sig Dispense Refill   amLODipine  (NORVASC ) 5 MG tablet Take 1 tablet (5 mg total) by mouth daily. 90 tablet 3   benazepril  (LOTENSIN ) 40 MG tablet TAKE ONE TABLET BY MOUTH ONE TIME DAILY 90 tablet 3   busPIRone  (BUSPAR ) 10 MG tablet TAKE ONE TABLET BY MOUTH FOUR TIMES A DAY AS NEEDED FOR ANIXETY 360 tablet 1   Calcium -Magnesium-Vitamin D  (CITRACAL CALCIUM  +D3 PO)      carboxymethylcellulose (REFRESH PLUS) 0.5 % SOLN Place 1-2 drops into both eyes 3 (three) times daily as needed (dry/irritated eyes.).     cholecalciferol (VITAMIN D3) 25 MCG (1000 UNIT) tablet Take 1,000 Units by mouth daily.     cyclobenzaprine  (FLEXERIL ) 10 MG tablet Take 1 tablet (10 mg total) by mouth at bedtime. 90 tablet 3   DULoxetine  (CYMBALTA ) 60 MG capsule Take 1  capsule (60 mg total) by mouth daily. 90 capsule 1   FERROUS BISGLYCINATE CHELATE PO Take 25 mg by mouth daily. Bronson Iron  Bisglycinate 25 mg Gentle on The Stomach Supports Energy & Healthy Red Blood Cell Production     fluticasone  (FLONASE ) 50 MCG/ACT nasal spray USE TWO SPRAYS IN EACH NOSTRIL ONE TIME DAILY 16 mL 1   furosemide  (LASIX ) 20 MG tablet Take 1 tablet (20 mg total) by mouth daily as needed for edema. 90 tablet 3   HYDROcodone -acetaminophen  (NORCO) 7.5-325 MG  tablet Take 1 tablet by mouth every 6 (six) hours as needed for moderate pain (pain score 4-6). 120 tablet 0   melatonin 3 MG TABS tablet Take 3 mg by mouth at bedtime.     Multiple Vitamin (MULTIVITAMIN WITH MINERALS) TABS tablet Take 1 tablet by mouth in the morning.     traZODone  (DESYREL ) 150 MG tablet TAKE ONE TABLET BY MOUTH AT BEDTIME 90 tablet 0   XARELTO  20 MG TABS tablet TAKE ONE TABLET BY MOUTH ONE TIME DAILY WITH SUPPER 90 tablet 2   atorvastatin  (LIPITOR) 10 MG tablet Take 1 tablet (10 mg total) by mouth every other day. (Patient not taking: Reported on 09/13/2023) 45 tablet 3   cyanocobalamin  (VITAMIN B12) 1000 MCG/ML injection INJECT 1ML INTO THE SKIN EVERY 30 DAYS AS DIRECTED (Patient taking differently: Inject 1,000 mcg into the skin every 30 (thirty) days.) 1 mL PRN   [START ON 09/19/2023] HYDROcodone -acetaminophen  (NORCO) 7.5-325 MG tablet Take 1 tablet by mouth every 6 (six) hours as needed for moderate pain (pain score 4-6). 120 tablet 0   [START ON 10/19/2023] HYDROcodone -acetaminophen  (NORCO) 7.5-325 MG tablet Take 1 tablet by mouth every 6 (six) hours as needed for moderate pain (pain score 4-6). 120 tablet 0   Pertinent GI related medications were reviewed with the patient  Inpatient Medications  Current Facility-Administered Medications:    acetaminophen  (TYLENOL ) tablet 650 mg, 650 mg, Oral, Q6H PRN **OR** acetaminophen  (TYLENOL ) suppository 650 mg, 650 mg, Rectal, Q6H PRN, Danford, Lonni SQUIBB, MD    amLODipine  (NORVASC ) tablet 5 mg, 5 mg, Oral, Daily, Danford, Lonni SQUIBB, MD   atorvastatin  (LIPITOR) tablet 10 mg, 10 mg, Oral, QODAY, Danford, Lonni SQUIBB, MD   benazepril  (LOTENSIN ) tablet 40 mg, 40 mg, Oral, Daily, Danford, Lonni SQUIBB, MD   busPIRone  (BUSPAR ) tablet 10 mg, 10 mg, Oral, TID, Danford, Lonni SQUIBB, MD, 10 mg at 09/13/23 2322   cefTRIAXone  (ROCEPHIN ) 2 g in sodium chloride  0.9 % 100 mL IVPB, 2 g, Intravenous, Q24H, Danford, Lonni SQUIBB, MD, Last Rate: 200 mL/hr at 09/14/23 0139, 2 g at 09/14/23 0139   cyclobenzaprine  (FLEXERIL ) tablet 10 mg, 10 mg, Oral, QHS, Danford, Lonni SQUIBB, MD, 10 mg at 09/13/23 2321   DULoxetine  (CYMBALTA ) DR capsule 60 mg, 60 mg, Oral, Daily, Danford, Lonni SQUIBB, MD   HYDROcodone -acetaminophen  (NORCO) 7.5-325 MG per tablet 1 tablet, 1 tablet, Oral, Q6H PRN, Danford, Lonni SQUIBB, MD, 1 tablet at 09/13/23 2321   melatonin tablet 2.5 mg, 2.5 mg, Oral, QHS, Danford, Lonni SQUIBB, MD, 2.5 mg at 09/13/23 2322   metroNIDAZOLE  (FLAGYL ) IVPB 500 mg, 500 mg, Intravenous, Q12H, Danford, Lonni SQUIBB, MD   ondansetron  (ZOFRAN ) tablet 4 mg, 4 mg, Oral, Q6H PRN **OR** ondansetron  (ZOFRAN ) injection 4 mg, 4 mg, Intravenous, Q6H PRN, Danford, Lonni SQUIBB, MD   traZODone  (DESYREL ) tablet 150 mg, 150 mg, Oral, QHS, Danford, Lonni SQUIBB, MD, 150 mg at 09/13/23 2322  cefTRIAXone  (ROCEPHIN )  IV 2 g (09/14/23 0139)   metronidazole       acetaminophen  **OR** acetaminophen , HYDROcodone -acetaminophen , ondansetron  **OR** ondansetron  (ZOFRAN ) IV   Objective   Vitals:   09/13/23 1914 09/13/23 2155 09/14/23 0039 09/14/23 0501  BP:  131/88 (!) 127/57 103/77  Pulse:  95 92 94  Resp:  18  16  Temp:  98.5 F (36.9 C) 98.1 F (36.7 C) 98.7 F (37.1 C)  TempSrc:  Oral Oral   SpO2: 100% 100% 94% 93%  Weight:      Height:  Physical Exam Vitals and nursing note reviewed.  Constitutional:      General: He is not in acute distress.     Appearance: He is obese. He is not ill-appearing, toxic-appearing or diaphoretic.  HENT:     Head: Normocephalic and atraumatic.     Nose: Nose normal.     Mouth/Throat:     Mouth: Mucous membranes are moist.     Pharynx: Oropharynx is clear.  Eyes:     General: No scleral icterus.    Extraocular Movements: Extraocular movements intact.  Cardiovascular:     Rate and Rhythm: Normal rate and regular rhythm.     Heart sounds: Normal heart sounds. No murmur heard.    No friction rub. No gallop.  Pulmonary:     Effort: Pulmonary effort is normal. No respiratory distress.     Breath sounds: Normal breath sounds. No wheezing, rhonchi or rales.  Abdominal:     General: Bowel sounds are normal. There is no distension.     Palpations: Abdomen is soft.     Tenderness: There is no abdominal tenderness. There is no guarding or rebound.  Musculoskeletal:     Cervical back: Neck supple.     Right lower leg: No edema.     Left lower leg: No edema.  Skin:    General: Skin is warm and dry.     Coloration: Skin is not jaundiced or pale.  Neurological:     General: No focal deficit present.     Mental Status: He is alert and oriented to person, place, and time. Mental status is at baseline.  Psychiatric:        Mood and Affect: Mood normal.        Behavior: Behavior normal.        Thought Content: Thought content normal.        Judgment: Judgment normal.     Laboratory Data Recent Labs  Lab 09/13/23 1840  WBC 14.9*  HGB 14.1  HCT 44.1  PLT 200   Recent Labs  Lab 09/13/23 1840  NA 140  K 4.6  CL 104  CO2 24  BUN 16  CALCIUM  8.9  PROT 6.5  BILITOT 0.9  ALKPHOS 73  ALT 12  AST 17  GLUCOSE 115*   Recent Labs  Lab 09/13/23 1943  INR 1.2    No results for input(s): LIPASE in the last 72 hours.      Imaging Studies: CT Angio Abd/Pel W and/or Wo Contrast Result Date: 09/13/2023 CLINICAL DATA:  Bright red blood per rectum lungs are also. Rectal bleeding for 4 days.  EXAM: CTA ABDOMEN AND PELVIS WITHOUT AND WITH CONTRAST TECHNIQUE: Multidetector CT imaging of the abdomen and pelvis was performed using the standard protocol during bolus administration of intravenous contrast. Multiplanar reconstructed images and MIPs were obtained and reviewed to evaluate the vascular anatomy. RADIATION DOSE REDUCTION: This exam was performed according to the departmental dose-optimization program which includes automated exposure control, adjustment of the mA and/or kV according to patient size and/or use of iterative reconstruction technique. CONTRAST:  OMNIPAQUE  IOHEXOL  350 MG/ML SOLN COMPARISON:  05/15/2023 FINDINGS: VASCULAR Aorta: Calcification of the aorta. No aneurysm or dissection. No significant stenosis. Celiac: Patent without evidence of aneurysm, dissection, vasculitis or significant stenosis. SMA: Patent without evidence of aneurysm, dissection, vasculitis or significant stenosis. Renals: Both renal arteries are patent without evidence of aneurysm, dissection, vasculitis, fibromuscular dysplasia or significant stenosis. IMA: Patent without evidence of aneurysm, dissection, vasculitis or significant  stenosis. Inflow: Patent without evidence of aneurysm, dissection, vasculitis or significant stenosis. Proximal Outflow: Bilateral common femoral and visualized portions of the superficial and profunda femoral arteries are patent without evidence of aneurysm, dissection, vasculitis or significant stenosis. Veins: No obvious venous abnormality within the limitations of this arterial phase study. Review of the MIP images confirms the above findings. NON-VASCULAR Lower chest: 5 mm pulmonary nodule in the right lung base. No change since previous studies dating back to 03/13/2017. Long-term stability suggests benign etiology and no imaging follow-up is indicated. Hepatobiliary: No focal liver lesions. Cholelithiasis without wall thickening or stranding. No bile duct dilatation.  Pancreas: Unremarkable. No pancreatic ductal dilatation or surrounding inflammatory changes. Spleen: Normal in size without focal abnormality. Adrenals/Urinary Tract: Adrenal glands are unremarkable. Kidneys are normal, without renal calculi, focal lesion, or hydronephrosis. Bladder is unremarkable. Stomach/Bowel: Stomach, small bowel, and colon are not abnormally distended. Diverticulosis of the sigmoid colon. Fat stranding around the junction of the rectosigmoid colon with small pericolonic collection likely representing enlarged diverticula and measuring about 1.1 cm. This is consistent with acute diverticulitis. Wall thickening this area is probably inflammatory but follow-up after resolution of acute process is recommended to exclude underlying colon cancer. No discrete abscess. No intraluminal contrast extravasation or contrast pooling. No focal site of acute gastrointestinal bleeding is identified. Postoperative changes consistent with gastric bypass. Lymphatic: Scattered retroperitoneal and pelvic lymph nodes are not pathologically enlarged. Reproductive: Prostate seed implants.  Prostate gland is atrophic. Other: No free air or free fluid in the abdomen. Abdominal wall musculature appears intact. Musculoskeletal: Degenerative changes throughout the spine. Spondylolysis with minimal spondylolisthesis at L5-S1. IMPRESSION: VASCULAR 1. Aortic atherosclerosis. 2. No evidence of aneurysm or dissection or significant stenosis involving the abdominal aorta or visceral branches. NON-VASCULAR 1. Colonic diverticulosis with prominent diverticulum and surrounding stranding with wall thickening at the level of the rectosigmoid colon. This most likely represents acute diverticulitis. Follow-up after resolution of acute process is recommended to exclude underlying colon neoplasm given the focal wall thickening. No abscess. 2. Cholelithiasis without evidence of acute cholecystitis. Electronically Signed   By: Elsie Gravely M.D.   On: 09/13/2023 20:09    Assessment:   # LGIB 2/2 diverticulitis - While things like hemorrhoids or radiation proctitis may be at play with his bleeding, given that acute diverticulitis was seen on CT, this is likely the source  # leukocytosis 2/2 diverticulitis  # Diverticulosis # Hemorrhoids  # afib s/p ablation and currently on xarelto   # obesity  # h/o prostate ancer s/p radiation  Plan:   Given his active diverticulitis- no plan for endoscopy at this time. Recommend repeat colonoscopy in 6-8 weeks as outpatient  Xarelto  currently on hold given bleeding Avoid aspirin  and NSAIDs (ibuprofen, Motrin, Advil, naproxen, Aleve, Naprosyn, Goody's powder, & BC powder).  Should he become hemodynamically unstable, consider repeat CTA for embolization  Abx as per primary team Advance diet as tolerated- low residue in case change in plan is needed  Monitor H&H.  Transfusion and resuscitation as per primary team Avoid frequent lab draws to prevent lab induced anemia Supportive care and antiemetics as per primary team Maintain two sites IV access Monitor for GIB.  Management of other medical comorbidities as per primary team  No GI intervention planned at this time  Dr. Unk will be taking over the GI service this weekend. GI to sign off at this time. Available as needed. Please do not hesitate to call regarding questions or concerns.  I  personally performed the service.  Thank you for allowing us  to participate in this patient's care. Please don't hesitate to call if any questions or concerns arise.   Elspeth Ozell Jungling, DO [facility]  Portions of the record may have been created with voice recognition software. Occasional wrong-word or 'sound-a-like' substitutions may have occurred due to the inherent limitations of voice recognition software.  Read the chart carefully and recognize, using context, where substitutions may have  occurred.

## 2023-09-14 NOTE — Progress Notes (Signed)
 Pt discharged to home with family via wheelchair. PIV removed. Discharge instructions reviewed and all questions and concerns answered.

## 2023-09-15 NOTE — Discharge Summary (Signed)
 Physician Discharge Summary   Patient: [name] MRN: [id number] DOB: Oct 26, 1955  Admit date:     09/13/2023  Discharge date: 09/14/2023  Discharge Physician: Cresencio Fairly   PCP: Edman Marsa JINNY, DO   Recommendations at discharge:    F/up with outpt providers as requested  Discharge Diagnoses: Principal Problem:   Acute diverticulitis Active Problems:   Essential hypertension   BRBPR (bright red blood per rectum)   H/O gastric bypass   Morbid obesity with BMI of 45.0-49.9, adult (HCC)   Mixed hyperlipidemia   Paroxysmal atrial fibrillation   Chronic pain syndrome   Chronic diastolic CHF (congestive heart failure) (HCC)   Anticoagulated  Hospital Course: 68 y.o. M with MO s/p gastric bypass, HTN, CAD, HFmrEF more recently recovered to 55-60%, pros CA s/p brachytherapy, pAF s/p cardioversion on Xarelto , HTN, and dCHF who presented with bright red rectal bleeding.  Has had rectal spotting for over a year, EGD, colonoscopy in 2024 by Dr. Unk showed diverticulosis and hemorrhoids only.  Then in the last 5 days, started to pass more blood, filling the toilet bowl, passing red clots, and today several bloody BMs so he came to the ER.  In the ER, HGb normal, WBC 14K.  CTA abdomen showed no active bleeding, but did show sigmoid divertiulitis.   Given antibiotics and hospitalists consulted for observation overnight.  Assessment and Plan: * Acute diverticulitis Rectal bleeding Presented with LLQ cramps, increased rectal bleeding.  Found to have leukocytosis, CT evidence of diverticulitis without abscess.  EGD and colonoscopy 1 year ago showed hemorrhoids and diverticulosis only. - GI Seen and recommended no endoscopy while inpatient.They recommend repeat colonoscopy in 6-8 weeks as outpatient  - Hemorrhoids or radiation proctitis may be at play with his bleeding, but given that acute diverticulitis was seen on CT, this is likely the source - Hemodynamically stable, hemoglobin  stable. - Treated with IV Rocephin  and Flagyl  for diverticulitis - Tolerated Soft diet - Given normal Hgb (has history B12 anemia due to bypass and iron  deficiency anemia currently replete) - Hold Xarelto  for next 48 hrs or until the bleeding has stopped. - Avoid aspirin  and NSAIDs (ibuprofen, Motrin, Advil, naproxen, Aleve, Naprosyn, Goody's powder, & BC powder). - We offered further monitoring but considering hemodynamic stability, no further bleed and tolerating diet - he requested DC.  - Request low residue diet for new few days   Chronic diastolic CHF (congestive heart failure) (HCC) Appears euvolemic - Held Lasix  while in the Hospital.  Chronic pain syndrome - Continue home Norco 7.5-325 and Flexeril   Paroxysmal atrial fibrillation Sounds regular on exam. - Hold Xarelto  for next 48 hrs or until the bleeding stops  Mixed hyperlipidemia - Continue LIpitor  Morbid obesity with BMI of 45.0-49.9, adult (HCC) BMI of 47, complicates care  H/O gastric bypass  Essential hypertension BP normal - Continue benazepril , amlodipine          Consultants: GI  Disposition: Home Diet recommendation:  Discharge Diet Orders (From admission, onward)     Start     Ordered   09/14/23 0000  Diet - low sodium heart healthy        09/14/23 1426           Carb modified diet DISCHARGE MEDICATION: Allergies as of 09/14/2023       Reactions   Bupivacaine  Liposome Palpitations   Pt was given this for shoulder surgery in 2023 and states heart was pounding        Medication  List     PAUSE taking these medications    Xarelto  20 MG Tabs tablet Wait to take this until: September 17, 2023 Generic drug: rivaroxaban  TAKE ONE TABLET BY MOUTH ONE TIME DAILY WITH SUPPER       STOP taking these medications    atorvastatin  10 MG tablet Commonly known as: LIPITOR   fluticasone  50 MCG/ACT nasal spray Commonly known as: FLONASE    furosemide  20 MG tablet Commonly known as:  LASIX        TAKE these medications    amLODipine  5 MG tablet Commonly known as: NORVASC  Take 1 tablet (5 mg total) by mouth daily.   benazepril  40 MG tablet Commonly known as: LOTENSIN  TAKE ONE TABLET BY MOUTH ONE TIME DAILY   busPIRone  10 MG tablet Commonly known as: BUSPAR  TAKE ONE TABLET BY MOUTH FOUR TIMES A DAY AS NEEDED FOR ANIXETY   carboxymethylcellulose 0.5 % Soln Commonly known as: REFRESH PLUS Place 1-2 drops into both eyes 3 (three) times daily as needed (dry/irritated eyes.).   cholecalciferol 25 MCG (1000 UNIT) tablet Commonly known as: VITAMIN D3 Take 1,000 Units by mouth daily.   ciprofloxacin  500 MG tablet Commonly known as: Cipro  Take 1 tablet (500 mg total) by mouth 2 (two) times daily for 7 days.   CITRACAL CALCIUM  +D3 PO   cyanocobalamin  1000 MCG/ML injection Commonly known as: VITAMIN B12 INJECT 1ML INTO THE SKIN EVERY 30 DAYS AS DIRECTED What changed: See the new instructions.   cyclobenzaprine  10 MG tablet Commonly known as: FLEXERIL  Take 1 tablet (10 mg total) by mouth at bedtime.   DULoxetine  60 MG capsule Commonly known as: CYMBALTA  Take 1 capsule (60 mg total) by mouth daily.   FERROUS BISGLYCINATE CHELATE PO Take 25 mg by mouth daily. Bronson Iron  Bisglycinate 25 mg Gentle on The Stomach Supports Energy & Healthy Red Blood Cell Production   HYDROcodone -acetaminophen  7.5-325 MG tablet Commonly known as: NORCO Take 1 tablet by mouth every 6 (six) hours as needed for moderate pain (pain score 4-6). Start taking on: September 19, 2023 What changed: Another medication with the same name was removed. Continue taking this medication, and follow the directions you see here.   melatonin 3 MG Tabs tablet Take 3 mg by mouth at bedtime.   metroNIDAZOLE  500 MG tablet Commonly known as: FLAGYL  Take 1 tablet (500 mg total) by mouth 3 (three) times daily for 7 days.   multivitamin with minerals Tabs tablet Take 1 tablet by mouth in the  morning.   traZODone  150 MG tablet Commonly known as: DESYREL  TAKE ONE TABLET BY MOUTH AT BEDTIME        Follow-up Information     Edman Marsa PARAS, DO. Schedule an appointment as soon as possible for a visit in 3 day(s).   Specialty: Family Medicine Why: [facility] Discharge F/UP Contact information: [address] [place] [address] [phone]         Unk Corinn Skiff, MD. Schedule an appointment as soon as possible for a visit in 1 week(s).   Specialty: Gastroenterology Why: [facility] Discharge F/UP Contact information: [address] [place] [address] [phone]                Discharge Exam: Fredricka Weights   09/13/23 1837  Weight: (!) 158.8 kg   Adult male, lying in bed, interactive and appropriate, makes eye contact, hygiene normal Sclera anicteric, conjunctival pink, lids and lashes normal.  No nasal deformity, discharge, or epistaxis Dentition  in good repair, oropharynx moist, no oral lesions, lips normal RRR, no murmurs, no peripheral edema, JVP not visible Respiratory rate normal, lung sounds clear without rales or wheezes bilaterally Abdomen soft, mild tenderness to palpation deeply on the left side, no rigidity or rebound at all No deformities or effusions of the large joints bilaterally or of the small joints of the hands bilaterally Attention normal, affect appropriate, oriented x 3, face symmetric, speech fluent, upper extremity strength 5/5 and symmetric, extraocular movements intact  Condition at discharge: good  The results of significant diagnostics from this hospitalization (including imaging, microbiology, ancillary and laboratory) are listed below for reference.   Imaging Studies: CT Angio Abd/Pel W and/or Wo Contrast Result Date: 09/13/2023 CLINICAL DATA:  Bright red blood per rectum lungs are also. Rectal bleeding for 4 days. EXAM: CTA ABDOMEN AND PELVIS WITHOUT AND WITH CONTRAST TECHNIQUE: Multidetector CT  imaging of the abdomen and pelvis was performed using the standard protocol during bolus administration of intravenous contrast. Multiplanar reconstructed images and MIPs were obtained and reviewed to evaluate the vascular anatomy. RADIATION DOSE REDUCTION: This exam was performed according to the departmental dose-optimization program which includes automated exposure control, adjustment of the mA and/or kV according to patient size and/or use of iterative reconstruction technique. CONTRAST:  OMNIPAQUE  IOHEXOL  350 MG/ML SOLN COMPARISON:  05/15/2023 FINDINGS: VASCULAR Aorta: Calcification of the aorta. No aneurysm or dissection. No significant stenosis. Celiac: Patent without evidence of aneurysm, dissection, vasculitis or significant stenosis. SMA: Patent without evidence of aneurysm, dissection, vasculitis or significant stenosis. Renals: Both renal arteries are patent without evidence of aneurysm, dissection, vasculitis, fibromuscular dysplasia or significant stenosis. IMA: Patent without evidence of aneurysm, dissection, vasculitis or significant stenosis. Inflow: Patent without evidence of aneurysm, dissection, vasculitis or significant stenosis. Proximal Outflow: Bilateral common femoral and visualized portions of the superficial and profunda femoral arteries are patent without evidence of aneurysm, dissection, vasculitis or significant stenosis. Veins: No obvious venous abnormality within the limitations of this arterial phase study. Review of the MIP images confirms the above findings. NON-VASCULAR Lower chest: 5 mm pulmonary nodule in the right lung base. No change since previous studies dating back to 03/13/2017. Long-term stability suggests benign etiology and no imaging follow-up is indicated. Hepatobiliary: No focal liver lesions. Cholelithiasis without wall thickening or stranding. No bile duct dilatation. Pancreas: Unremarkable. No pancreatic ductal dilatation or surrounding inflammatory  changes. Spleen: Normal in size without focal abnormality. Adrenals/Urinary Tract: Adrenal glands are unremarkable. Kidneys are normal, without renal calculi, focal lesion, or hydronephrosis. Bladder is unremarkable. Stomach/Bowel: Stomach, small bowel, and colon are not abnormally distended. Diverticulosis of the sigmoid colon. Fat stranding around the junction of the rectosigmoid colon with small pericolonic collection likely representing enlarged diverticula and measuring about 1.1 cm. This is consistent with acute diverticulitis. Wall thickening this area is probably inflammatory but follow-up after resolution of acute process is recommended to exclude underlying colon cancer. No discrete abscess. No intraluminal contrast extravasation or contrast pooling. No focal site of acute gastrointestinal bleeding is identified. Postoperative changes consistent with gastric bypass. Lymphatic: Scattered retroperitoneal and pelvic lymph nodes are not pathologically enlarged. Reproductive: Prostate seed implants.  Prostate gland is atrophic. Other: No free air or free fluid in the abdomen. Abdominal wall musculature appears intact. Musculoskeletal: Degenerative changes throughout the spine. Spondylolysis with minimal spondylolisthesis at L5-S1. IMPRESSION: VASCULAR 1. Aortic atherosclerosis. 2. No evidence of aneurysm or dissection or significant stenosis involving the abdominal aorta or visceral branches. NON-VASCULAR 1. Colonic diverticulosis with  prominent diverticulum and surrounding stranding with wall thickening at the level of the rectosigmoid colon. This most likely represents acute diverticulitis. Follow-up after resolution of acute process is recommended to exclude underlying colon neoplasm given the focal wall thickening. No abscess. 2. Cholelithiasis without evidence of acute cholecystitis. Electronically Signed   By: Elsie Gravely M.D.   On: 09/13/2023 20:09   CT CARDIAC SCORING (SELF PAY ONLY) Addendum  Date: 09/13/2023 ADDENDUM REPORT: 09/13/2023 19:35 EXAM: OVER-READ INTERPRETATION  CT CHEST The following report is an over-read performed by radiologist Dr. Fonda Mom [facility] on 09/13/2023. This over-read does not include interpretation of cardiac or coronary anatomy or pathology. The coronary calcium  score interpretation by the cardiologist is attached. COMPARISON:  03/13/2017. FINDINGS: Cardiovascular:  See findings discussed in the body of the report. Mediastinum/Nodes: No suspicious adenopathy identified. Imaged mediastinal structures are unremarkable. Lungs/Pleura: Right lower lobe 5 mm nodule represents a stable finding compared to 2019. Imaged lungs are otherwise clear. No pleural effusion or pneumothorax. Upper Abdomen: No acute abnormality.  Postop changes in the stomach. Musculoskeletal: No chest wall abnormality. No acute osseous findings. IMPRESSION: No acute extracardiac incidental findings. Electronically Signed   By: Fonda Field M.D.   On: 09/13/2023 19:35   Result Date: 09/13/2023 CLINICAL DATA:  Risk stratification EXAM: Coronary Calcium  Score TECHNIQUE: The patient was scanned on a Siemens Somatom scanner. Axial non-contrast 3 mm slices were carried out through the heart. The data set was analyzed on a dedicated work station and scored using the Agatson method. FINDINGS: Non-cardiac: See separate report from [facility]. Ascending Aorta: Normal size of the tubular portion. Mildly dilated aortic root at the sinuses of 40mm. Pericardium: Normal Coronary arteries: Normal origin of left and right coronary arteries. Likely left-dominant circulation, though incompletely evaluated on this exam. Distribution of arterial calcifications if present, as noted below; LM 34 LAD 437 LCx 342 RCA 0 Total 812 IMPRESSION AND RECOMMENDATION: 1. Coronary calcium  score of 812. This was 81st percentile for age and sex matched control. 2. Mildly dilated aortic root. CAC >300 in  LAD, LCx. CAC-DRS A3/N2. CAC-DRS A0: CAC score 0: Statin generally not recommended unless other high risk condition (e.g., FH, DM2, tobacco use, etc.) CAC-DRS A1: CAC score 1-99: moderate intensity statin CAC-DRS A2: CAC score 100-299: moderate to high intensity statin + ASA 81mg  CAC-DRS A3: CAC score > 300: high intensity statin + ASA 81mg  Consider cardiology consultation. Continue heart healthy lifestyle and risk factor modification. Electronically Signed: By: Caron Poser On: 09/11/2023 15:04    Microbiology: Results for orders placed or performed during the hospital encounter of 09/13/23  Blood culture (routine x 2)     Status: None (Preliminary result)   Collection Time: 09/13/23  7:43 PM   Specimen: BLOOD  Result Value Ref Range Status   Specimen Description BLOOD BLOOD RIGHT ARM  Final   Special Requests   Final    BOTTLES DRAWN AEROBIC AND ANAEROBIC Blood Culture adequate volume   Culture   Final    NO GROWTH 2 DAYS Performed at [facility], [address] [place], [place] [address]    Report Status PENDING  Incomplete  Blood culture (routine x 2)     Status: None (Preliminary result)   Collection Time: 09/13/23  7:43 PM   Specimen: BLOOD  Result Value Ref Range Status   Specimen Description BLOOD BLOOD LEFT ARM  Final   Special Requests   Final    BOTTLES DRAWN AEROBIC AND  ANAEROBIC Blood Culture adequate volume   Culture   Final    NO GROWTH 2 DAYS Performed at [facility], [address] [place], [place] [address]    Report Status PENDING  Incomplete   *Note: Due to a large number of results and/or encounters for the requested time period, some results have not been displayed. A complete set of results can be found in Results Review.    Labs: CBC: Recent Labs  Lab 09/13/23 1840 09/14/23 0604  WBC 14.9* 12.0*  HGB 14.1 13.1  HCT 44.1 39.7  MCV 89.1 88.4  PLT 200 189   Basic Metabolic Panel: Recent Labs  Lab 09/13/23 1840  09/14/23 0604  NA 140 139  K 4.6 4.0  CL 104 105  CO2 24 25  GLUCOSE 115* 113*  BUN 16 12  CREATININE 1.04 0.91  CALCIUM  8.9 8.5*   Liver Function Tests: Recent Labs  Lab 09/13/23 1840  AST 17  ALT 12  ALKPHOS 73  BILITOT 0.9  PROT 6.5  ALBUMIN 4.0   CBG: No results for input(s): GLUCAP in the last 168 hours.  Discharge time spent: greater than 30 minutes.  Signed: Cresencio Fairly, MD [facility] 09/15/2023

## 2023-09-18 LAB — CULTURE, BLOOD (ROUTINE X 2)
Culture: NO GROWTH
Culture: NO GROWTH
Special Requests: ADEQUATE
Special Requests: ADEQUATE

## 2023-09-19 ENCOUNTER — Ambulatory Visit (INDEPENDENT_AMBULATORY_CARE_PROVIDER_SITE_OTHER): Admitting: Family Medicine

## 2023-09-19 ENCOUNTER — Encounter: Payer: Self-pay | Admitting: Family Medicine

## 2023-09-19 VITALS — BP 124/82 | HR 102 | Ht 72.0 in | Wt 350.0 lb

## 2023-09-19 DIAGNOSIS — K625 Hemorrhage of anus and rectum: Secondary | ICD-10-CM

## 2023-09-19 DIAGNOSIS — K5792 Diverticulitis of intestine, part unspecified, without perforation or abscess without bleeding: Secondary | ICD-10-CM

## 2023-09-19 DIAGNOSIS — K921 Melena: Secondary | ICD-10-CM | POA: Diagnosis not present

## 2023-09-19 DIAGNOSIS — D510 Vitamin B12 deficiency anemia due to intrinsic factor deficiency: Secondary | ICD-10-CM | POA: Diagnosis not present

## 2023-09-19 NOTE — Progress Notes (Signed)
 Subjective:    Patient ID: [name], male    DOB: 10/02/1955, 67 y.o.   MRN: [id number]  MARQUIE ADERHOLD is a 68 y.o. male presenting on 09/19/2023 for Hospitalization Follow-up   HPI  Discussed the use of AI scribe software for clinical note transcription with the patient, who gave verbal consent to proceed.  History of Present Illness   [name] is a 68 year old male with diverticulitis who presents for a hospital follow-up after experiencing rectal bleeding.      HOSPITAL FOLLOW-UP VISIT  Hospital/Location: [facility] Date of Admission: 09/13/23 Date of Discharge: 09/14/23 Transitions of care telephone call: Not completed  Reason for Admission: Rectal Bleeding  FOLLOW-UP  - Hospital H&P and Discharge Summary have been reviewed - Patient presents today about 5 days after recent hospitalization.   Lower gastrointestinal bleeding and diverticulitis - Hospitalized on September 13, 2023, for diverticulitis and rectal bleeding; discharged September 14, 2023 - During hospitalization, experienced left lower abdominal pain and rectal bleeding - Initial white blood cell count elevated at 14.9 - CT scan showed colonic diverticulosis with wall thickening - Received IV antibiotics during hospitalization; discharged with oral antibiotics (metronidazole  and ciprofloxacin ) - Hemoglobin decreased from 14 to 13 during hospital stay - Rectal bleeding (bright red blood) resolved last Sunday; currently passing black stools - Experiencing urgency and frequent bowel movements, with immediate need to defecate after eating or drinking - Currently feels weak and drained since discharge  Antibiotic therapy - Currently taking metronidazole  (Flagyl ) and ciprofloxacin  (Cipro ); one taken twice daily, the other three times daily - Expected to complete antibiotic course by Friday morning, September 21, 2023  Gastrointestinal follow-up and laboratory monitoring - Follow-up appointment with gastroenterology  scheduled for September 24, 2023 - Iron  infusion appointment scheduled for September 27, 2023 (history of pernicious anemia, followed by Hematology) - Scheduled for blood count today to monitor hemoglobin levels - Recent colonoscopy and other endoscopic evaluations within the past year were normal - Last iron  panel in June; next iron  panel scheduled for December  - Today reports overall has done well after discharge. Symptoms of bright red blood resolved, still has dark stools and loose stool. But improved, resolved pain. - New medications on discharge: Cipro  Flagyl  - Changes to current meds on discharge: None  I have reviewed the discharge medication list, and have reconciled the current and discharge medications today.      09/19/2023    2:20 PM 08/20/2023    1:35 PM 08/06/2023    1:15 PM  Depression screen PHQ 2/9  Decreased Interest 2 1 1   Down, Depressed, Hopeless 0 0 0  PHQ - 2 Score 2 1 1   Altered sleeping 2  1  Tired, decreased energy   3  Change in appetite 2  0  Feeling bad or failure about yourself  1  0  Trouble concentrating 1  1  Moving slowly or fidgety/restless 0  0  Suicidal thoughts 0  0  PHQ-9 Score 8  6  Difficult doing work/chores Somewhat difficult  Very difficult       09/19/2023    2:20 PM 08/06/2023    1:15 PM 05/08/2023    3:14 PM 01/24/2023    8:24 AM  GAD 7 : Generalized Anxiety Score  Nervous, Anxious, on Edge 1 1 2 1   Control/stop worrying 1 0 1 0  Worry too much - different things 1 0 0 0  Trouble relaxing 0 0 0 1  Restless 0 0 0 0  Easily annoyed or irritable 1 1 2 1   Afraid - awful might happen 0 0 1 0  Total GAD 7 Score 4 2 6 3   Anxiety Difficulty Somewhat difficult Somewhat difficult Very difficult     Social History   Tobacco Use   Smoking status: Former    Current packs/day: 0.00    Average packs/day: 1 pack/day for 10.0 years (10.0 ttl pk-yrs)    Types: Cigarettes    Start date: 02/17/1969    Quit date: 02/18/1979    Years since  quitting: 44.6   Smokeless tobacco: Former    Types: Snuff  Vaping Use   Vaping status: Never Used  Substance Use Topics   Alcohol  use: Not Currently    Comment: last drink in 2021   Drug use: No    Review of Systems Per HPI unless specifically indicated above     Objective:    BP 124/82 (BP Location: Left Arm, Patient Position: Sitting, Cuff Size: Large)   Pulse (!) 102   Ht 6' (1.829 m)   Wt (!) 350 lb (158.8 kg)   SpO2 96%   BMI 47.47 kg/m   Wt Readings from Last 3 Encounters:  09/19/23 (!) 350 lb (158.8 kg)  09/13/23 (!) 350 lb (158.8 kg)  08/20/23 (!) 350 lb (158.8 kg)    Physical Exam Vitals and nursing note reviewed.  Constitutional:      General: He is not in acute distress.    Appearance: Normal appearance. He is well-developed. He is not diaphoretic.     Comments: Well-appearing, comfortable, cooperative  HENT:     Head: Normocephalic and atraumatic.  Eyes:     General:        Right eye: No discharge.        Left eye: No discharge.     Conjunctiva/sclera: Conjunctivae normal.  Cardiovascular:     Rate and Rhythm: Normal rate.  Pulmonary:     Effort: Pulmonary effort is normal.  Skin:    General: Skin is warm and dry.     Findings: No erythema or rash.  Neurological:     Mental Status: He is alert and oriented to person, place, and time.  Psychiatric:        Mood and Affect: Mood normal.        Behavior: Behavior normal.        Thought Content: Thought content normal.     Comments: Well groomed, good eye contact, normal speech and thoughts     I have personally reviewed the radiology report from 09/13/23 on CT angio abdomen pelvis.  Narrative & Impression  CLINICAL DATA:  Bright red blood per rectum lungs are also. Rectal bleeding for 4 days.   EXAM: CTA ABDOMEN AND PELVIS WITHOUT AND WITH CONTRAST   TECHNIQUE: Multidetector CT imaging of the abdomen and pelvis was performed using the standard protocol during bolus administration  of intravenous contrast. Multiplanar reconstructed images and MIPs were obtained and reviewed to evaluate the vascular anatomy.   RADIATION DOSE REDUCTION: This exam was performed according to the departmental dose-optimization program which includes automated exposure control, adjustment of the mA and/or kV according to patient size and/or use of iterative reconstruction technique.   CONTRAST:  OMNIPAQUE  IOHEXOL  350 MG/ML SOLN   COMPARISON:  05/15/2023   FINDINGS: VASCULAR   Aorta: Calcification of the aorta. No aneurysm or dissection. No significant stenosis.   Celiac: Patent without evidence of aneurysm, dissection, vasculitis  or significant stenosis.   SMA: Patent without evidence of aneurysm, dissection, vasculitis or significant stenosis.   Renals: Both renal arteries are patent without evidence of aneurysm, dissection, vasculitis, fibromuscular dysplasia or significant stenosis.   IMA: Patent without evidence of aneurysm, dissection, vasculitis or significant stenosis.   Inflow: Patent without evidence of aneurysm, dissection, vasculitis or significant stenosis.   Proximal Outflow: Bilateral common femoral and visualized portions of the superficial and profunda femoral arteries are patent without evidence of aneurysm, dissection, vasculitis or significant stenosis.   Veins: No obvious venous abnormality within the limitations of this arterial phase study.   Review of the MIP images confirms the above findings.   NON-VASCULAR   Lower chest: 5 mm pulmonary nodule in the right lung base. No change since previous studies dating back to 03/13/2017. Long-term stability suggests benign etiology and no imaging follow-up is indicated.   Hepatobiliary: No focal liver lesions. Cholelithiasis without wall thickening or stranding. No bile duct dilatation.   Pancreas: Unremarkable. No pancreatic ductal dilatation or surrounding inflammatory changes.   Spleen:  Normal in size without focal abnormality.   Adrenals/Urinary Tract: Adrenal glands are unremarkable. Kidneys are normal, without renal calculi, focal lesion, or hydronephrosis. Bladder is unremarkable.   Stomach/Bowel: Stomach, small bowel, and colon are not abnormally distended. Diverticulosis of the sigmoid colon. Fat stranding around the junction of the rectosigmoid colon with small pericolonic collection likely representing enlarged diverticula and measuring about 1.1 cm. This is consistent with acute diverticulitis. Wall thickening this area is probably inflammatory but follow-up after resolution of acute process is recommended to exclude underlying colon cancer. No discrete abscess. No intraluminal contrast extravasation or contrast pooling. No focal site of acute gastrointestinal bleeding is identified. Postoperative changes consistent with gastric bypass.   Lymphatic: Scattered retroperitoneal and pelvic lymph nodes are not pathologically enlarged.   Reproductive: Prostate seed implants.  Prostate gland is atrophic.   Other: No free air or free fluid in the abdomen. Abdominal wall musculature appears intact.   Musculoskeletal: Degenerative changes throughout the spine. Spondylolysis with minimal spondylolisthesis at L5-S1.   IMPRESSION: VASCULAR   1. Aortic atherosclerosis. 2. No evidence of aneurysm or dissection or significant stenosis involving the abdominal aorta or visceral branches.   NON-VASCULAR   1. Colonic diverticulosis with prominent diverticulum and surrounding stranding with wall thickening at the level of the rectosigmoid colon. This most likely represents acute diverticulitis. Follow-up after resolution of acute process is recommended to exclude underlying colon neoplasm given the focal wall thickening. No abscess. 2. Cholelithiasis without evidence of acute cholecystitis.     Electronically Signed   By: Elsie Gravely M.D.   On: 09/13/2023  20:09    Results for orders placed or performed during the hospital encounter of 09/13/23  Comprehensive metabolic panel   Collection Time: 09/13/23  6:40 PM  Result Value Ref Range   Sodium 140 135 - 145 mmol/L   Potassium 4.6 3.5 - 5.1 mmol/L   Chloride 104 98 - 111 mmol/L   CO2 24 22 - 32 mmol/L   Glucose, Bld 115 (H) 70 - 99 mg/dL   BUN 16 8 - 23 mg/dL   Creatinine, Ser 8.95 0.61 - 1.24 mg/dL   Calcium  8.9 8.9 - 10.3 mg/dL   Total Protein 6.5 6.5 - 8.1 g/dL   Albumin 4.0 3.5 - 5.0 g/dL   AST 17 15 - 41 U/L   ALT 12 0 - 44 U/L   Alkaline Phosphatase 73 38 - 126 U/L  Total Bilirubin 0.9 0.0 - 1.2 mg/dL   GFR, Estimated >39 >39 mL/min   Anion gap 12 5 - 15  CBC   Collection Time: 09/13/23  6:40 PM  Result Value Ref Range   WBC 14.9 (H) 4.0 - 10.5 K/uL   RBC 4.95 4.22 - 5.81 MIL/uL   Hemoglobin 14.1 13.0 - 17.0 g/dL   HCT 55.8 60.9 - 47.9 %   MCV 89.1 80.0 - 100.0 fL   MCH 28.5 26.0 - 34.0 pg   MCHC 32.0 30.0 - 36.0 g/dL   RDW 86.6 88.4 - 84.4 %   Platelets 200 150 - 400 K/uL   nRBC 0.0 0.0 - 0.2 %  Type and screen [facility]   Collection Time: 09/13/23  6:40 PM  Result Value Ref Range   ABO/RH(D) O NEG    Antibody Screen NEG    Sample Expiration      09/16/2023,2359 Performed at [facility], [address] [place], [place] [address]   Blood culture (routine x 2)   Collection Time: 09/13/23  7:43 PM   Specimen: BLOOD  Result Value Ref Range   Specimen Description BLOOD BLOOD RIGHT ARM    Special Requests      BOTTLES DRAWN AEROBIC AND ANAEROBIC Blood Culture adequate volume   Culture      NO GROWTH 5 DAYS Performed at [facility], [address] [place], [place] [address]    Report Status 09/18/2023 FINAL   Blood culture (routine x 2)   Collection Time: 09/13/23  7:43 PM   Specimen: BLOOD  Result Value Ref Range   Specimen Description BLOOD BLOOD LEFT ARM    Special Requests      BOTTLES DRAWN AEROBIC AND  ANAEROBIC Blood Culture adequate volume   Culture      NO GROWTH 5 DAYS Performed at [facility], [address] [place], [place] [address]    Report Status 09/18/2023 FINAL   Lactic acid, plasma   Collection Time: 09/13/23  7:43 PM  Result Value Ref Range   Lactic Acid, Venous 1.2 0.5 - 1.9 mmol/L  Protime-INR   Collection Time: 09/13/23  7:43 PM  Result Value Ref Range   Prothrombin Time 16.1 (H) 11.4 - 15.2 seconds   INR 1.2 0.8 - 1.2  APTT   Collection Time: 09/13/23  7:43 PM  Result Value Ref Range   aPTT 37 (H) 24 - 36 seconds  HIV Antibody (routine testing w rflx)   Collection Time: 09/14/23  6:04 AM  Result Value Ref Range   HIV Screen 4th Generation wRfx Non Reactive Non Reactive  CBC   Collection Time: 09/14/23  6:04 AM  Result Value Ref Range   WBC 12.0 (H) 4.0 - 10.5 K/uL   RBC 4.49 4.22 - 5.81 MIL/uL   Hemoglobin 13.1 13.0 - 17.0 g/dL   HCT 60.2 60.9 - 47.9 %   MCV 88.4 80.0 - 100.0 fL   MCH 29.2 26.0 - 34.0 pg   MCHC 33.0 30.0 - 36.0 g/dL   RDW 86.7 88.4 - 84.4 %   Platelets 189 150 - 400 K/uL   nRBC 0.0 0.0 - 0.2 %  Basic metabolic panel   Collection Time: 09/14/23  6:04 AM  Result Value Ref Range   Sodium 139 135 - 145 mmol/L   Potassium 4.0 3.5 - 5.1 mmol/L   Chloride 105 98 - 111 mmol/L   CO2 25 22 - 32 mmol/L  Glucose, Bld 113 (H) 70 - 99 mg/dL   BUN 12 8 - 23 mg/dL   Creatinine, Ser 9.08 0.61 - 1.24 mg/dL   Calcium  8.5 (L) 8.9 - 10.3 mg/dL   GFR, Estimated >39 >39 mL/min   Anion gap 9 5 - 15   *Note: Due to a large number of results and/or encounters for the requested time period, some results have not been displayed. A complete set of results can be found in Results Review.      Assessment & Plan:   Problem List Items Addressed This Visit     Acute diverticulitis - Primary   Relevant Orders   CBC with Differential/Platelet   Comprehensive metabolic panel with GFR   BRBPR (bright red blood per rectum)   Relevant Orders    Comprehensive metabolic panel with GFR   Iron , TIBC and Ferritin Panel   Pernicious anemia   Relevant Orders   CBC with Differential/Platelet   Iron , TIBC and Ferritin Panel   Other Visit Diagnoses       Melena       Relevant Orders   Iron , TIBC and Ferritin Panel       HFU Acute diverticulitis of colon with gastrointestinal bleeding Recent hospitalization for diverticulitis with rectal bleeding. CT confirmed diverticulosis with wall thickening. Treated with IV and oral antibiotics. Currently has melena and increased bowel movements. - Complete metronidazole  and ciprofloxacin  by September 21, 2023. - Attend GI follow-up on September 24, 2023.  Iron  deficiency anemia secondary to gastrointestinal blood loss Hemoglobin decreased from 14 to 13, indicating blood loss. Iron  infusion scheduled. Blood count and iron  panel ordered. - Order blood count and iron  panel. - Proceed with iron  infusion on September 27, 2023. - Share blood test results with GI specialist and hematologist.        Orders Placed This Encounter  Procedures   CBC with Differential/Platelet   Comprehensive metabolic panel with GFR   Iron , TIBC and Ferritin Panel    No orders of the defined types were placed in this encounter.   Follow up plan: Return if symptoms worsen or fail to improve.   Marsa Officer, DO [facility] 09/19/2023, 1:56 PM

## 2023-09-19 NOTE — Patient Instructions (Addendum)
 Thank you for coming to the office today.  Labs today iron  and hemoglobin chemistry panel  Stay tuned for results  Share with GI on 9/8  Finish antibiotics as scheduled.  Please schedule a Follow-up Appointment to: Return if symptoms worsen or fail to improve.  If you have any other questions or concerns, please feel free to call the office or send a message through MyChart. You may also schedule an earlier appointment if necessary.  Additionally, you may be receiving a survey about your experience at our office within a few days to 1 week by e-mail or mail. We value your feedback.  Marsa Officer, DO [facility], [place]

## 2023-09-20 ENCOUNTER — Ambulatory Visit: Payer: Self-pay | Admitting: Family Medicine

## 2023-09-20 LAB — COMPREHENSIVE METABOLIC PANEL WITH GFR
AG Ratio: 2 (calc) (ref 1.0–2.5)
ALT: 12 U/L (ref 9–46)
AST: 19 U/L (ref 10–35)
Albumin: 4.1 g/dL (ref 3.6–5.1)
Alkaline phosphatase (APISO): 65 U/L (ref 35–144)
BUN: 16 mg/dL (ref 7–25)
CO2: 26 mmol/L (ref 20–32)
Calcium: 9.3 mg/dL (ref 8.6–10.3)
Chloride: 106 mmol/L (ref 98–110)
Creat: 1.12 mg/dL (ref 0.70–1.28)
Globulin: 2.1 g/dL (ref 1.9–3.7)
Glucose, Bld: 100 mg/dL — ABNORMAL HIGH (ref 65–99)
Potassium: 4.9 mmol/L (ref 3.5–5.3)
Sodium: 141 mmol/L (ref 135–146)
Total Bilirubin: 0.3 mg/dL (ref 0.2–1.2)
Total Protein: 6.2 g/dL (ref 6.1–8.1)
eGFR: 71 mL/min/1.73m2 (ref >=60)

## 2023-09-20 LAB — IRON,TIBC AND FERRITIN PANEL
%SAT: 25 % (ref 20–48)
Ferritin: 184 ng/mL (ref 24–380)
Iron: 61 ug/dL (ref 50–180)
TIBC: 240 ug/dL — ABNORMAL LOW (ref 250–425)

## 2023-09-20 LAB — CBC WITH DIFFERENTIAL/PLATELET
Absolute Lymphocytes: 2170 {cells}/uL (ref 850–3900)
Absolute Monocytes: 728 {cells}/uL (ref 200–950)
Basophils Absolute: 28 {cells}/uL (ref 0–200)
Basophils Relative: 0.4 %
Eosinophils Absolute: 63 {cells}/uL (ref 15–500)
Eosinophils Relative: 0.9 %
HCT: 41.2 % (ref 38.5–50.0)
Hemoglobin: 13.5 g/dL (ref 13.2–17.1)
MCH: 28.7 pg (ref 27.0–33.0)
MCHC: 32.8 g/dL (ref 32.0–36.0)
MCV: 87.5 fL (ref 80.0–100.0)
MPV: 9.1 fL (ref 7.5–12.5)
Monocytes Relative: 10.4 %
Neutro Abs: 4011 {cells}/uL (ref 1500–7800)
Neutrophils Relative %: 57.3 %
Platelets: 315 Thousand/uL (ref 140–400)
RBC: 4.71 Million/uL (ref 4.20–5.80)
RDW: 12.9 % (ref 11.0–15.0)
Total Lymphocyte: 31 %
WBC: 7 Thousand/uL (ref 3.8–10.8)

## 2023-09-27 ENCOUNTER — Inpatient Hospital Stay: Attending: Internal Medicine

## 2023-09-27 VITALS — BP 119/92 | HR 92 | Resp 18

## 2023-09-27 DIAGNOSIS — D649 Anemia, unspecified: Secondary | ICD-10-CM | POA: Insufficient documentation

## 2023-09-27 MED ORDER — IRON SUCROSE 20 MG/ML IV SOLN
200.0000 mg | Freq: Once | INTRAVENOUS | Status: AC
  Administered 2023-09-27: 200 mg via INTRAVENOUS
  Filled 2023-09-27: qty 10

## 2023-09-27 NOTE — Patient Instructions (Signed)

## 2023-09-30 ENCOUNTER — Other Ambulatory Visit: Payer: Self-pay | Admitting: Family Medicine

## 2023-09-30 DIAGNOSIS — G4700 Insomnia, unspecified: Secondary | ICD-10-CM

## 2023-10-02 NOTE — Telephone Encounter (Signed)
 Requested Prescriptions  Pending Prescriptions Disp Refills   traZODone  (DESYREL ) 150 MG tablet [Pharmacy Med Name: TRAZODONE  150 MG TAB[*]] 90 tablet 0    Sig: TAKE ONE TABLET BY MOUTH AT BEDTIME     Psychiatry: Antidepressants - Serotonin Modulator Passed - 10/02/2023 11:23 AM      Passed - Valid encounter within last 6 months    Recent Outpatient Visits           1 week ago Acute diverticulitis   [place] [facility] [place], Marsa PARAS, DO   1 month ago Annual physical exam   [place] [facility] [place] Marsa PARAS, DO   4 months ago Acute cystitis with hematuria   [place] [facility] [place] Marsa PARAS, DO       Future Appointments             In 1 month Riddle, Suzann, NP [facility]

## 2023-10-03 ENCOUNTER — Encounter: Payer: Self-pay | Admitting: Student in an Organized Health Care Education/Training Program

## 2023-10-03 ENCOUNTER — Ambulatory Visit
Attending: Student in an Organized Health Care Education/Training Program | Admitting: Student in an Organized Health Care Education/Training Program

## 2023-10-03 VITALS — BP 124/96 | HR 106 | Temp 97.1°F | Resp 16 | Ht 72.0 in | Wt 350.0 lb

## 2023-10-03 DIAGNOSIS — M65341 Trigger finger, right ring finger: Secondary | ICD-10-CM | POA: Diagnosis not present

## 2023-10-03 DIAGNOSIS — M65331 Trigger finger, right middle finger: Secondary | ICD-10-CM | POA: Diagnosis not present

## 2023-10-03 MED ORDER — DEXAMETHASONE SODIUM PHOSPHATE 10 MG/ML IJ SOLN
10.0000 mg | Freq: Once | INTRAMUSCULAR | Status: DC
  Administered 2023-10-03: 10 mg

## 2023-10-03 MED ORDER — LIDOCAINE HCL (PF) 2 % IJ SOLN
INTRAMUSCULAR | Status: AC
Start: 2023-10-03 — End: 2023-10-03
  Filled 2023-10-03: qty 10

## 2023-10-03 MED ORDER — LIDOCAINE HCL 2 % IJ SOLN
20.0000 mL | Freq: Once | INTRAMUSCULAR | Status: DC
  Administered 2023-10-03: 400 mg

## 2023-10-03 MED ORDER — DEXAMETHASONE SODIUM PHOSPHATE 10 MG/ML IJ SOLN
INTRAMUSCULAR | Status: AC
  Filled 2023-10-03: qty 1

## 2023-10-03 NOTE — Progress Notes (Signed)
 PROVIDER NOTE: Interpretation of information contained herein should be left to medically-trained personnel. Specific patient instructions are provided elsewhere under Patient Instructions section of medical record. This document was created in part using STT-dictation technology, any transcriptional errors that may result from this process are unintentional.  Patient: [name] Type: Established DOB: 09-22-1955 MRN: [id number] PCP: Edman Marsa JINNY, DO  Service: Procedure DOS: 10/03/2023 Setting: Ambulatory Location: Ambulatory outpatient facility Delivery: Face-to-face Provider: Wallie Sherry, MD Specialty: Interventional Pain Management Specialty designation: 09 Location: Outpatient facility Ref. Prov.: Edman Marsa *       Interventional Therapy   Primary Reason for Visit: Interventional Pain Management Treatment. CC: Pain (Fingers on right hand)    Procedure:          Anesthesia, Analgesia, Anxiolysis:  Type: Trigger Finger Ligament/Tendon sheath (20550) Injection.           Purpose: Therapeutic Target Area: Flexor Digitorum Tendon sheath nodule Region: A-2 and A-4 pulley of the metacarpal area Approach: Percutaneous Digit: No:3(Middle)  and Ring finger Finger Laterality: Right-hand  Type: Local Anesthesia Local Anesthetic: Lidocaine  1-2% Sedation: None  Indication(s):  Analgesia Route: Infiltration ([place]clair/IM) IV Access: N/A   Position: Sitting   1. Trigger middle finger of right hand   2. Trigger ring finger of right hand      NAS-11 Pain score:   Pre-procedure: 4 /10   Post-procedure: 4 /10     Pre-op H&P Assessment:  [name] is a 68 y.o. (year old), male patient, seen today for interventional treatment. He  has a past surgical history that includes Knee surgery (Right); Gastric bypass (01/17/2000); prostate seeding; Carpal tunnel release (Right, 12/23/2014); Carpal tunnel release (Left, 01/06/2015); Colonoscopy with propofol  (N/A,  10/08/2017); Cardioversion (N/A, 05/15/2019); Cardioversion (N/A, 03/13/2014); Reverse shoulder arthroplasty (Right, 09/27/2021); Bicept tenodesis (Right, 09/27/2021); Shoulder arthroscopy with rotator cuff repair and open biceps tenodesis (Left, 12/27/2021); Cardioversion (N/A, 03/24/2022); Cardioversion (N/A, 05/19/2022); Colonoscopy with propofol  (N/A, 06/07/2022); Esophagogastroduodenoscopy (egd) with propofol  (N/A, 06/07/2022); enteroscopy (06/07/2022); Cardioversion (N/A, 07/25/2022); Cardioversion (N/A, 09/21/2022); Atrial fibrillation ablation (N/A, 11/17/2022); and Reverse shoulder arthroplasty (Left, 02/20/2023). [name] has a current medication list which includes the following prescription(s): amlodipine , benazepril , buspirone , calcium -magnesium-vitamin d , carboxymethylcellulose, cholecalciferol, cyanocobalamin , cyclobenzaprine , duloxetine , ferrous bisglycinate chelate, hydrocodone -acetaminophen , melatonin, multivitamin with minerals, trazodone , and xarelto , and the following Facility-Administered Medications: dexamethasone  and lidocaine . His primarily concern today is the Pain (Fingers on right hand)  Initial Vital Signs:  Pulse/HCG Rate: (!) 106  Temp: (!) 97.1 F (36.2 C) Resp: 16 BP: (!) 124/96 SpO2: 97 %  BMI: Estimated body mass index is 47.47 kg/m as calculated from the following:   Height as of this encounter: 6' (1.829 m).   Weight as of this encounter: 350 lb (158.8 kg).  Risk Assessment: Allergies: Reviewed. He is allergic to bupivacaine  liposome.  Allergy Precautions: None required Coagulopathies: Reviewed. None identified.  Blood-thinner therapy: None at this time Active Infection(s): Reviewed. None identified. [name] is afebrile  Site Confirmation: [name] was asked to confirm the procedure and laterality before marking the site Procedure checklist: Completed Consent: Before the procedure and under the influence of no sedative(s), amnesic(s), or anxiolytics,  the patient was informed of the treatment options, risks and possible complications. To fulfill our ethical and legal obligations, as recommended by the American Medical Association's Code of Ethics, I have informed the patient of my clinical impression; the nature and purpose of the treatment or procedure; the risks, benefits, and possible complications of the intervention; the alternatives,  including doing nothing; the risk(s) and benefit(s) of the alternative treatment(s) or procedure(s); and the risk(s) and benefit(s) of doing nothing. The patient was provided information about the general risks and possible complications associated with the procedure. These may include, but are not limited to: failure to achieve desired goals, infection, bleeding, organ or nerve damage, allergic reactions, paralysis, and death. In addition, the patient was informed of those risks and complications associated to the procedure, such as failure to decrease pain; infection; bleeding; organ or nerve damage with subsequent damage to sensory, motor, and/or autonomic systems, resulting in permanent pain, numbness, and/or weakness of one or several areas of the body; allergic reactions; (i.e.: anaphylactic reaction); and/or death. Furthermore, the patient was informed of those risks and complications associated with the medications. These include, but are not limited to: allergic reactions (i.e.: anaphylactic or anaphylactoid reaction(s)); adrenal axis suppression; blood sugar elevation that in diabetics may result in ketoacidosis or comma; water retention that in patients with history of congestive heart failure may result in shortness of breath, pulmonary edema, and decompensation with resultant heart failure; weight gain; swelling or edema; medication-induced neural toxicity; particulate matter embolism and blood vessel occlusion with resultant organ, and/or nervous system infarction; and/or aseptic necrosis of one or more  joints. Finally, the patient was informed that Medicine is not an exact science; therefore, there is also the possibility of unforeseen or unpredictable risks and/or possible complications that may result in a catastrophic outcome. The patient indicated having understood very clearly. We have given the patient no guarantees and we have made no promises. Enough time was given to the patient to ask questions, all of which were answered to the patient's satisfaction. Mr. Axel has indicated that he wanted to continue with the procedure. Attestation: I, the ordering provider, attest that I have discussed with the patient the benefits, risks, side-effects, alternatives, likelihood of achieving goals, and potential problems during recovery for the procedure that I have provided informed consent. Date  Time: 10/03/2023  1:43 PM  Pre-Procedure Preparation:  Monitoring: As per clinic protocol. Respiration, ETCO2, SpO2, BP, heart rate and rhythm monitor placed and checked for adequate function Safety Precautions: Patient was assessed for positional comfort and pressure points before starting the procedure. Time-out: I initiated and conducted the Time-out before starting the procedure, as per protocol. The patient was asked to participate by confirming the accuracy of the Time Out information. Verification of the correct person, site, and procedure were performed and confirmed by me, the nursing staff, and the patient. Time-out conducted as per Joint Commission's Universal Protocol (UP.01.01.01). Time: 1417 Start Time: 1417 hrs.  Description of Procedure:          Area Prepped: Entire palmar and dorsal aspect of hand, up to forearm area. DuraPrep (Iodine Povacrylex [0.7% available iodine] and Isopropyl Alcohol , 74% w/w) Safety Precautions: Aspiration looking for blood return was conducted prior to all injections. At no point did we inject any substances, as a needle was being advanced. No attempts were  made at seeking any paresthesias. Safe injection practices and needle disposal techniques used. Medications properly checked for expiration dates. SDV (single dose vial) medications used. Description of the Procedure: Protocol guidelines were followed. The patient was placed in position. The target area was identified and prepped in the usual manner. Skin & deeper tissues infiltrated with local anesthetic. Appropriate time provided for local anesthetics to take effect. The procedure needle was slowly advanced to target area. Proper needle placement secured. Negative aspiration confirmed. Solution  injected in intermittent fashion, asking for systemic symptoms every 0.5cc. Needle(s) removed and area cleaned, making sure to leave some prepping solution back to take advantage of its long term bactericidal properties.  Vitals:   10/03/23 1356  BP: (!) 124/96  Pulse: (!) 106  Resp: 16  Temp: (!) 97.1 F (36.2 C)  SpO2: 97%  Weight: (!) 350 lb (158.8 kg)  Height: 6' (1.829 m)    Start Time: 1417 hrs. End Time: 1420 hrs. Materials:  Needle(s) Type: Regular needle Gauge: 25G Length: 1.5-in Medication(s): Please see orders for medications and dosing details.  5 cc solution made of 4 cc of 2% Lidocaine , 1 cc of Decadron  10 mg/cc.  1.5 cc injected for the A1 pulley, 1.5 cc injected for the A3 pulley for right middle finger. 1.5 cc injected for the A1 pulley, 1.5 cc injected for the A3 pulley for right ring finger.     Post-operative Assessment:  Post-procedure Vital Signs:  Pulse/HCG Rate: (!) 106  Temp: (!) 97.1 F (36.2 C) Resp: 16 BP: (!) 124/96 SpO2: 97 %  EBL: None  Complications: No immediate post-treatment complications observed by team, or reported by patient.  Note: The patient tolerated the entire procedure well. A repeat set of vitals were taken after the procedure and the patient was kept under observation following institutional policy, for this type of procedure.  Post-procedural neurological assessment was performed, showing return to baseline, prior to discharge. The patient was provided with post-procedure discharge instructions, including a section on how to identify potential problems. Should any problems arise concerning this procedure, the patient was given instructions to immediately contact us , at any time, without hesitation. In any case, we plan to contact the patient by telephone for a follow-up status report regarding this interventional procedure.  Comments:  No additional relevant information.  Plan of Care (POC)  Orders:  Orders Placed This Encounter  Procedures   AMB referral to orthopedics    Referral Priority:   Routine    Referral Type:   Consultation    Referral Reason:   Patient Preference    Referred to Provider:   Harry Lenis, MD    Requested Specialty:   Orthopedic Surgery    Number of Visits Requested:   1   Chronic Opioid Analgesic:   hydrocodone  7.5 mg 3 times daily as needed     Medications ordered for procedure: Meds ordered this encounter  Medications   lidocaine  (XYLOCAINE ) 2 % (with pres) injection 400 mg   dexamethasone  (DECADRON ) injection 10 mg   Medications administered: Gaither PARAS. Harton had no medications administered during this visit.  See the medical record for exact dosing, route, and time of administration.  Follow-up plan:   Return for Keep sch. appt.       Recent Visits Date Type Provider Dept  08/20/23 Procedure visit Marcelino Nurse, MD [facility]  Showing recent visits within past 90 days and meeting all other requirements Today's Visits Date Type Provider Dept  10/03/23 Procedure visit Marcelino Nurse, MD [facility]  Showing today's visits and meeting all other requirements Future Appointments Date Type Provider Dept  11/15/23 Appointment Patel, Seema K, NP [facility]  Showing future appointments within next 90 days and meeting all other  requirements  Disposition: Discharge home  Discharge (Date  Time): 10/03/2023; 1430 hrs.   Primary Care Physician: Edman Marsa PARAS, DO Location: [facility] Note by: Nurse Marcelino, MD (TTS technology used. I apologize for  any typographical errors that were not detected and corrected.) Date: 10/03/2023; Time: 2:30 PM  Disclaimer:  Medicine is not an Visual merchandiser. The only guarantee in medicine is that nothing is guaranteed. It is important to note that the decision to proceed with this intervention was based on the information collected from the patient. The Data and conclusions were drawn from the patient's questionnaire, the interview, and the physical examination. Because the information was provided in large part by the patient, it cannot be guaranteed that it has not been purposely or unconsciously manipulated. Every effort has been made to obtain as much relevant data as possible for this evaluation. It is important to note that the conclusions that lead to this procedure are derived in large part from the available data. Always take into account that the treatment will also be dependent on availability of resources and existing treatment guidelines, considered by other Pain Management Practitioners as being common knowledge and practice, at the time of the intervention. For Medico-Legal purposes, it is also important to point out that variation in procedural techniques and pharmacological choices are the acceptable norm. The indications, contraindications, technique, and results of the above procedure should only be interpreted and judged by a Board-Certified Interventional Pain Specialist with extensive familiarity and expertise in the same exact procedure and technique.

## 2023-10-03 NOTE — Patient Instructions (Signed)

## 2023-10-03 NOTE — Progress Notes (Signed)
 Safety precautions to be maintained throughout the outpatient stay will include: orient to surroundings, keep bed in low position, maintain call bell within reach at all times, provide assistance with transfer out of bed and ambulation.

## 2023-10-04 ENCOUNTER — Telehealth: Payer: Self-pay | Admitting: *Deleted

## 2023-10-04 NOTE — Telephone Encounter (Signed)
 Post procedure call:   no  questions or concerns.

## 2023-10-08 ENCOUNTER — Other Ambulatory Visit (HOSPITAL_COMMUNITY): Payer: Self-pay

## 2023-10-08 DIAGNOSIS — K625 Hemorrhage of anus and rectum: Secondary | ICD-10-CM | POA: Diagnosis not present

## 2023-10-08 DIAGNOSIS — K529 Noninfective gastroenteritis and colitis, unspecified: Secondary | ICD-10-CM | POA: Diagnosis not present

## 2023-10-09 ENCOUNTER — Telehealth: Payer: Self-pay | Admitting: Cardiovascular Disease

## 2023-10-09 NOTE — Telephone Encounter (Signed)
 Pharmacy, can you please comment on how long Xarelto  can be held for upcoming procedures?  Thank you!

## 2023-10-09 NOTE — Telephone Encounter (Signed)
   Pre-operative Risk Assessment    Patient Name: [name]  DOB: Apr 22, 1955 MRN: [id number]   Date of last office visit: 06/01/23 Date of next office visit: 11/28/23   Request for Surgical Clearance    Procedure:  Flexible Sigmoidoscopy  Date of Surgery:  Clearance 11/02/23                                Surgeon:  Dr Unk Surgeon's Group or Practice Name:  [facility] Phone number:  [phone] Fax number:  [phone]   Type of Clearance Requested:   - Pharmacy:  Hold Rivaroxaban  (Xarelto ) instructions   Type of Anesthesia:  Not Indicated   Additional requests/questions:    [name]   10/09/2023, 11:10 AM

## 2023-10-11 ENCOUNTER — Ambulatory Visit: Attending: Orthopedic Surgery

## 2023-10-11 DIAGNOSIS — M25512 Pain in left shoulder: Secondary | ICD-10-CM | POA: Insufficient documentation

## 2023-10-11 DIAGNOSIS — M6281 Muscle weakness (generalized): Secondary | ICD-10-CM | POA: Diagnosis not present

## 2023-10-11 DIAGNOSIS — G8929 Other chronic pain: Secondary | ICD-10-CM | POA: Diagnosis not present

## 2023-10-11 NOTE — Therapy (Signed)
 OUTPATIENT PHYSICAL THERAPY SHOULDER TREATMENT  Patient Name: [name] MRN: [id number] DOB:May 22, 1955, 68 y.o., male Today's Date: 10/12/2023   PT End of Session - 10/11/23 1507     Visit Number 17    Number of Visits 49    Date for Recertification  01/03/24    Authorization Type eval: 02/26/23, [employer] Appeal Approved [id number], [employer] Medicare 2025  CO:[place] on AUTH  Copay: $20  OOP:$3900/$0 used  [name] is req  Ref #:[id number]    PT Start Time 1504    PT Stop Time 1546    PT Time Calculation (min) 42 min    Activity Tolerance Patient tolerated treatment well    Behavior During Therapy WFL for tasks assessed/performed         Past Medical History:  Diagnosis Date   (HFpEF) heart failure with preserved ejection fraction (HCC)    a.) TTE 12/26/2013: EF 45-50%, mild ant and antsept HK. mild MR. Mod dil LA. nl RV fxn. Rhythm was Afib; b.) TTE 07/04/2019: EF 55%, mid-apical anteroseptal HK, mild MAC, mild Ao sclerosis, G2DD, RVSP 45.3; c.) TTE 06/02/2022: EF 55-60%, no RWMAs, sev LAE, RVSP 37.9, mild-mod MR, mild AR, AoV sclerosis without stenosis, asc Ao 38 mm   Anxiety    Aortic atherosclerosis    Cardiomyopathy (in setting of Afib)    a.) TTE 12/26/2013: EF 45-50%; b.) TTE 07/04/2019: EF 55%; c.) TTE 06/02/2022: EF 55-60%   Chronic pain syndrome    a.) followed by pain management   Chronic, continuous use of opioids    a.) on COT; followed by pain management   Coronary artery disease 11/06/2014   a.) cCTA 11/06/2014: Ca2+ = 224 (74th %ile; LAD distribution)   Diverticulosis    DJD (degenerative joint disease) of knee    Former smoker    History of hiatal hernia    History of kidney stones 2012   Hyperlipidemia    Hyperplastic colon polyp    Hypertension    Inguinal hernia, left    Internal hemorrhoids    Intervertebral disc disorder with radiculopathy of lumbosacral region    Lesion of bone of lumbosacral spine    L5   Long term current use of amiodarone     Lower extremity  edema    Mixed hyperlipidemia    Morbid obesity (HCC)    Multiple falls    Myalgia due to statin    On rivaroxaban  therapy    Osteoarthritis    PAF (paroxysmal atrial fibrillation) (HCC)    a.) CHA2DS2VASc = 4 (age, HTN, CHF, vascular disease) as of 02/19/23;  b.) s/p DCCV 03/13/14 (200 J), 05/15/19 (150 J x 1, 200 J x2), 05/19/2022 (150 J x1, 200 J x2; pads changed to ant/lat postion with additional 200 J x 2 -> did not convert), 07/25/22 (200 J), 09/21/2022 (200 J); c.) s/p PVI ablation 11/17/2022; d.) rate/rhythm maintained on amiodarone ; chronically anticoagulated on rivaroxaban    Pernicious anemia    Prostate cancer (HCC)    Pulmonary nodule, right    a. 10/2014 Cardiac CTA: 7mm RLL nodule; b. 04/2015 CT Chest: stable 7mm RLL nodule. No new nodules; 02/2017 CTA Chest: stable, benign, 7mm RLL pulm nodule.   Rotator cuff tear, left    Sleep difficulties    a.) takes melatonin + trazodone  PRN   SVT (supraventricular tachycardia)    Tubular adenoma of colon    Past Surgical History:  Procedure Laterality Date   ATRIAL FIBRILLATION ABLATION N/A 11/17/2022   Procedure: ATRIAL  FIBRILLATION ABLATION; Location: [facility]; Surgeon: Gehi, Anil, MD   BICEPT TENODESIS Right 09/27/2021   Procedure: Right reverse shoulder arthroplasty, biceps tenodesis;  Surgeon: Tobie Priest, MD;  Location: [facility];  Service: Orthopedics;  Laterality: Right;   CARDIOVERSION N/A 05/15/2019   Procedure: CARDIOVERSION;  Surgeon: Perla Evalene PARAS, MD;  Location: [facility];  Service: Cardiovascular;  Laterality: N/A;   CARDIOVERSION N/A 03/13/2014   Procedure: CARDIOVERSION; Location: [facility]; Surgeon: Evalene Perla, MD   CARDIOVERSION N/A 03/24/2022   Procedure: CARDIOVERSION;  Surgeon: Perla Evalene PARAS, MD;  Location: [facility];  Service: Cardiovascular;  Laterality: N/A;   CARDIOVERSION N/A 05/19/2022   Procedure: CARDIOVERSION;  Surgeon: Perla Evalene PARAS, MD;  Location: [facility];  Service: Cardiovascular;  Laterality:  N/A;   CARDIOVERSION N/A 07/25/2022   Procedure: CARDIOVERSION;  Surgeon: Fernande Elspeth BROCKS, MD;  Location: [facility];  Service: Cardiovascular;  Laterality: N/A;   CARDIOVERSION N/A 09/21/2022   Procedure: CARDIOVERSION;  Surgeon: Fernande Elspeth BROCKS, MD;  Location: [facility];  Service: Cardiovascular;  Laterality: N/A;   CARPAL TUNNEL RELEASE Right 12/23/2014   Procedure: CARPAL TUNNEL RELEASE;  Surgeon: Kayla Pinal, MD;  Location: [facility];  Service: Orthopedics;  Laterality: Right;   CARPAL TUNNEL RELEASE Left 01/06/2015   Procedure: CARPAL TUNNEL RELEASE;  Surgeon: Kayla Pinal, MD;  Location: [facility];  Service: Orthopedics;  Laterality: Left;   COLONOSCOPY WITH PROPOFOL  N/A 10/08/2017   Procedure: COLONOSCOPY WITH PROPOFOL ;  Surgeon: Unk Corinn Skiff, MD;  Location: [facility];  Service: Gastroenterology;  Laterality: N/A;   COLONOSCOPY WITH PROPOFOL  N/A 06/07/2022   Procedure: COLONOSCOPY WITH PROPOFOL ;  Surgeon: Unk Corinn Skiff, MD;  Location: [facility];  Service: Gastroenterology;  Laterality: N/A;   ENTEROSCOPY  06/07/2022   Procedure: ENTEROSCOPY;  Surgeon: Unk Corinn Skiff, MD;  Location: [facility];  Service: Gastroenterology;;   ESOPHAGOGASTRODUODENOSCOPY (EGD) WITH PROPOFOL  N/A 06/07/2022   Procedure: ESOPHAGOGASTRODUODENOSCOPY (EGD) WITH PROPOFOL ;  Surgeon: Unk Corinn Skiff, MD;  Location: [facility] ENDOSCOPY;  Service: Gastroenterology;  Laterality: N/A;   GASTRIC BYPASS  01/17/2000   KNEE SURGERY Right    knee trauma x3   prostate seeding     REVERSE SHOULDER ARTHROPLASTY Right 09/27/2021   Procedure: Right reverse shoulder arthroplasty, biceps tenodesis;  Surgeon: Tobie Priest, MD;  Location: [facility];  Service: Orthopedics;  Laterality: Right;   REVERSE SHOULDER ARTHROPLASTY Left 02/20/2023   Procedure: Left reverse shoulder arthroplasty, biceps tenodesis;  Surgeon: Tobie Priest, MD;  Location: [facility];  Service: Orthopedics;  Laterality: Left;   SHOULDER  ARTHROSCOPY WITH ROTATOR CUFF REPAIR AND OPEN BICEPS TENODESIS Left 12/27/2021   Procedure: Left shoulder arthroscopic cuff repair (supraspinatus and subscapularis) with Regeneten Patch application;  Surgeon: Tobie Priest, MD;  Location: [facility];  Service: Orthopedics;  Laterality: Left;   Patient Active Problem List   Diagnosis Date Noted   Anticoagulated 09/14/2023   Acute diverticulitis 09/13/2023   Chronic diastolic CHF (congestive heart failure) (HCC) 09/13/2023   Symptomatic anemia 04/26/2022   Chronic pain of both knees 03/30/2022   Bilateral primary osteoarthritis of knee 03/30/2022   Shortness of breath 03/24/2022   Vitamin D  deficiency 02/24/2022   S/p reverse total shoulder arthroplasty 09/27/2021   Lesion of bone of lumbosacral spine (L5) 05/12/2021   Localized osteoarthritis of shoulder regions, bilateral 03/29/2021   Chronic pain of both shoulders 03/29/2021   Drug-induced myopathy 02/21/2021   Trigger finger of right hand 11/15/2020   Prostate cancer (HCC) 07/26/2020   Insomnia 08/01/2019   Primary  osteoarthritis of both wrists 02/17/2019   Trigger middle finger of left hand 02/17/2019   Chronic pain syndrome 02/17/2019   Chronic radicular lumbar pain 10/08/2018   Lumbar radiculopathy 10/08/2018   Lumbar degenerative disc disease 10/08/2018   Lumbar facet arthropathy 10/08/2018   Lumbar facet joint syndrome 10/08/2018   Intervertebral disc disorder with radiculopathy of lumbosacral region    Osteoarthritis of knee 11/30/2017   Osteoarthritis of wrist 11/30/2017   Pernicious anemia 05/22/2016   Paroxysmal atrial fibrillation 12/20/2015   Carpal tunnel syndrome 11/16/2014   DJD (degenerative joint disease) of knee 10/27/2014   Bilateral carpal tunnel syndrome 10/27/2014   Morbid obesity with BMI of 45.0-49.9, adult (HCC) 10/27/2014   Mixed hyperlipidemia 10/27/2014   H/O gastric bypass 03/20/2014   Hyperkalemia 03/20/2014   History of prostate cancer 12/26/2013    Essential hypertension 12/26/2013   Encounter for anticoagulation discussion and counseling 12/26/2013   BRBPR (bright red blood per rectum) 12/26/2013   PCP: Dr. Edman  REFERRING PROVIDER: Dr. Earnestine Blanch  REFERRING DIAG: [id number] (ICD-10-CM) - Complete rotator cuff tear or rupture of left shoulder, not specified as traumatic   RATIONALE FOR EVALUATION AND TREATMENT: Rehabilitation  THERAPY DIAG: Chronic left shoulder pain  Muscle weakness (generalized)  ONSET DATE: 02/20/23  FOLLOW-UP APPT SCHEDULED WITH REFERRING PROVIDER: Yes   FROM INITIAL EVALUATION SUBJECTIVE:                                                                                                                                                                                         SUBJECTIVE STATEMENT:  L reverse TSR 02/20/23  PERTINENT HISTORY:  DA AUTHEMENT is a 68 y.o. male who initially underwent arthroscopic subscapularis repair and side-to-side supraspinatus repair with Regeneten patch application by Dr. Blanch 12/27/2021. He underwent post-surgical physical therapy with initial minimal improvement followed by worsening shoulder strength/function. Repeat MRI showed large, retracted subscapularis re-tear and supraspinatus re-tear with signs of rotator cuff arthropathy. Conservative measures including medications, physical therapy, and cortisone injections did not provide adequate relief. The patient elected to proceed with reverse shoulder arthroplasty and biceps tenodesis on 02/20/23. Intraoperative findings include retracted full-thickness subscapularis tear, partial-thickness supraspinatus tear, and biceps tendinopathy. Surgery was a standard deltopectoral incision under general anesthesia along with an interscalene block. Per op note, operative arm to remain in sling at all times except ROM exercises and hygiene. Can perform pendulums, elbow/wrist/hand ROM exercises. Passive ROM allowed to 90 FF and 30 ER. ASA  325mg  x 6 weeks for DVT ppx (pt states that he was actually instructed to start back on his Xarelto  and not to use ASA). Plan for PT  starting on POD #3-4. Pt denies any post-operative complications from surgery. Pt has a history of chronic back pain and has undergone multiple lumbar facet, medial branch radiofrequency ablations. He also has a past history of cardiomyopathy (in setting of Afib), DJD (degenerative joint disease) of knee, kidney stones, lower extremity edema, PAF (paroxysmal atrial fibrillation), pernicious anemia, prostate cancer, and a pulmonary nodule. He also has a past surgical history that includes R reverse TSR, knee surgery, gastric bypass (2002), prostate seeding, carpal tunnel release (Right, 12/23/2014; Left, 01/06/2015), and multiple cardioversions.   PAIN:  Pain Intensity: No resting pain upon arrival Pain location: L shoulder Pain Quality: Nerve pain Radiating: No  Numbness/Tingling: No Focal Weakness: Yes 24-hour pain behavior: Varies History of prior shoulder or neck/shoulder injury, pain, surgery, or therapy: Yes PT for L shoulder s/p RTC repair, history of R reverse TSR; Dominant hand: right Red flags Positive: history of prostate CA, Negative: chills/fever, night sweats, nausea, vomiting, unrelenting pain, unexplained weight gain/loss  PRECAUTIONS: Shoulder, per operative note: operative arm to remain in sling at all times except ROM exercises and hygiene. Can perform pendulums, elbow/wrist/hand ROM exercises. Passive ROM allowed to 90 FF and 30 ER.   General reverse TSR precautions include no combined shoulder extension, IR, and adduction. No shoulder and elbow AROM (bicep tenodesis) for at least the first 4 weeks   WEIGHT BEARING RESTRICTIONS: Yes no WB through LUE  FALLS: Has patient fallen in last 6 months? 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  Recent memory is intact.  Remote memory is intact.  Attention span and concentration are intact.  Expressive speech is intact.  Patient's fund of knowledge is within normal limits for educational level.    Gross Musculoskeletal Assessment Tremor: None Bulk: Normal Tone: Normal Incision is clean and dry with minimal blood staining on honeycomb dressing as well as minimal swelling around the incision. Skin is cool to touch and no signs of infection present.   Gait Deferred full gait assessment  Posture Forward head  with upper thoracic kyphosis and rounded shoulders  Cervical Screen Deferred  ROM ROM (Normal range in degrees)    Right (AROM) Left (PROM)  Shoulder    Flexion 160 90 (limited per protocol)  Extension    Abduction 180 90 (scaption)  External Rotation 88 0  Internal Rotation 70 stomach  Hands Behind Head    Hands Behind Back        Elbow    Flexion Full Full  Extension 0 0  Pronation WNL Full palm up  Supination WNL Full palm down  (* = pain; Blank rows = not tested)  UE MMT: MMT (out of 5) Right Left   Shoulder   Flexion 5   Extension    Abduction 5   External rotation 5   Internal rotation 5   Horizontal abduction    Horizontal adduction    Lower Trapezius  Rhomboids        Elbow  Flexion 5   Extension 5   Pronation 5   Supination 5       Wrist  Flexion 5 5  Extension 5 5  Radial deviation   5  Ulnar deviation  5      MCP  Flexion 5 5  Extension 5 5  Abduction 5 5  Adduction 5 5  (* = pain; Blank rows = not tested)  AROM AROM     Right Left  Shoulder    Flexion 180 160  Extension    Abduction 152 138  External Rotation 90 88  Internal Rotation 70 74  Hands Behind Head C6 C3  Hands Behind Back L1 Lateral waistline  (* = pain; Blank rows = not tested)  07/31/23 UE MMT: MMT (out of 5) Right Left   Shoulder   Flexion 5 4*  Abduction 5 4*  External rotation (seated) 5 4*  Internal rotation (seated) 4+ 4+*  Horizontal abduction Unable to lay prone Unable to lay prone  Horizontal adduction    Lower Trapezius    Rhomboids        Elbow  Flexion 5 5  Extension 5 5  Pronation 5 5  Supination 5 5*   Shoulder Flexion  Peak Force (lb) Right 21.09 lb Left 10.19 lb Strength Difference 10.90 lb* Percentage Difference 69.66%*  Shoulder Abduction Peak Force (lb) Right 14.57 lb Left 8.33 lb Strength Difference 6.24 lb* Percentage Difference 54.48%*  Shoulder External Rotation Peak Force (lb) Right 20.28 lb Left 16.90 lb Strength Difference 3.37 lb* Percentage Difference 18.15%*  Shoulder Internal Rotation Peak Force (lb) Right 19.77 lb Left 20.43 lb Strength Difference 0.66 lb Percentage Difference 3.28%    TODAY'S TREATMENT: 10/11/2023    Subjective: Patient reports that he was having severe pain until he returned to see his orthopedic surgeon. He was given an injection in his L shoulder which has resulted in slight improvement (worst pain decreased from 10/10 to 8/10). He is able to sleep slightly more comfortable now. However he reports ongoing weakness, pain, and limitations in AROM especially if he tries to reach away from his body out to the L side. He also continues with significant popping and clicking as well as L sided neck pain. Pt took some pain medication before arriving today. He has been unable to come to therapy since 08/09/23 due  to insurance denial followed by an appeal. He believes that he has declined considerably since he was last in therapy.   PAIN: Burning/ache    Ther-ex  Updated outcome measures with patient during visit today: QuickDASH:  59.1% L shoulder pain: worst: 8/10;  AROM AROM     Right Left  Shoulder    Flexion  156  Extension    Abduction/Scaption  84*  External Rotation  50*  Internal Rotation  54*  Hands Behind Head    Hands Behind Back    (* = pain; Blank rows = not tested)  UE MMT: MMT (out of 5) Right Left   Shoulder   Flexion  4*  Abduction  4*  External rotation (seated)  4*  Internal rotation (seated)  4+*  Horizontal abduction  Unable to lay prone  Horizontal adduction    Lower Trapezius    Rhomboids        Elbow  Flexion  5  Extension  5  Pronation  5  Supination  5   In semi-recumbent position: L  shoulder flexion with 2# dumbbell 2 x 10; L shoulder scaption with 2# DB x 5; (DC secondary to pain); L shoulder overhead press with 2# DB x 10; L shoulder manually resisted ER 2 x 10; L shoulder manually resisted IR 2 x 10; L elbow manually resisted flexion/extension 2 x 10 each; Dowel chest press with manual resistance from therapist 2 x 10;   Not performed: Seated dowel rows with manual resistance from therapist 2 x 10; Nautilus lat pull downs with hand grips 95# 3 x 10; Nautilus rows with hand grips 50# 3 x 10; Seated pulleys for L shoulder AAROM overhead flexion and abduction x multiple bouts each direction;   HOME EXERCISE PROGRAM:  Access Code: [email or web address] URL: [email or web address] Date: 07/31/2023 Prepared by: Selinda Eck  Exercises - Seated Cervical Sidebending Stretch (Mirrored)  - 2 x daily - 7 x weekly - 3 reps - 30s hold - Seated Scapular Retraction  - 2 x daily - 7 x weekly - 2 sets - 10 reps - 3s hold - Standing Shoulder Flexion Full Range Single Arm  - 1 x daily - 7 x weekly - 2 sets - 10 reps - 3s hold - Standing  Shoulder Abduction Full Range Single Arm  - 1 x daily - 7 x weekly - 2 sets - 10 reps - 3s hold - Wrist Flexion AROM  - 2 x daily - 7 x weekly - 2 sets - 10 reps - 3s hold - Wrist Extension AROM  - 1 x daily - 7 x weekly - 2 sets - 10 reps - 3s hold - Shoulder External Rotation and Scapular Retraction with Resistance  - 1 x daily - 7 x weekly - 2 sets - 10 reps - 3s hold    ASSESSMENT: Updated outcome measures/goals with patient during visit today. His L shoulder AROM is worse today compared to when it was last updated prior to stopping therapy due to insurance denial. His MMT is still painful and he has to regress from a 4# DB prior to ending therapy to a 2# DB today. Exercises are painful and his muscles fatigue quicker. His QuickDASH only worsened slightly since it was last updated and was 59.1% today. Fortunately since his L shoulder injection his worst pain has decreased from 10/10 to 8/10. He is very limited with the functional use of his LUE. Mr. Weinkauf would benefit from ongoing PT to prevent additional decline, regain L shoulder strength, and decrease pain. Pt encouraged to continue HEP and follow up as directed. He will continue to benefit from skilled therapy to address remaining deficits in L shoulder weakness and pain order to improve quality of life and return to PLOF.     OBJECTIVE IMPAIRMENTS: decreased ROM, decreased strength, and pain.   ACTIVITY LIMITATIONS: carrying, lifting, bathing, toileting, dressing, and reach over head  PARTICIPATION LIMITATIONS: meal prep, cleaning, laundry, driving, shopping, community activity, and yard work  PERSONAL FACTORS: Past/current experiences, Time since onset of injury/illness/exacerbation, and 3+ comorbidities: OA, anemia, anxiety, cardiomyopathy, chronic pain are also affecting patient's functional outcome.   REHAB POTENTIAL: Good  CLINICAL DECISION MAKING: Unstable/unpredictable  EVALUATION COMPLEXITY: High   GOALS: Goals reviewed  with patient? Yes  SHORT TERM GOALS: Target date: 07/09/2023   Pt will be independent with HEP to improve strength and decrease shoulder pain to improve pain-free function at home and work. Baseline: Issued and progressing Goal status: ONGOING   LONG TERM GOALS: Target date: 08/20/2023  1.  Pt will report  no further L shoulder pain with all AROM in order to complete all ADLs and improve pain-free function. Baseline: 05/28/23: worst pain 7/10; 07/31/23: 10/10; 10/12/23: 8/10; Goal status: ONGOING  2.  Pt will decrease QuickDASH score to below 30% in order to demonstrate clinically significant reduction in disability related to shoulder pain        Baseline: 02/26/23: 72.7%; 05/28/23: 40.9%; 07/31/23: 56.8%; 10/11/23: 59.1% Goal status: PARTIALLY MET  3. Pt will increase strength of pain-free L shoulder flexion and scaption to at least 4/5 in order to demonstrate improvement in strength and function         Baseline: 02/26/23: Unable to test; 05/28/23: flexion: 4+/5, pain free, scaption: 4/5, pain; 07/31/23: see note above, painful; 10/11/23:  flexion: 4/5 with pain, scaption: 4/5 with pain; Goal status: PARTIALLY MET  4. Pt will improve L shoulder AROM flexion and scaption to at least 110 degrees in order to demonstrate improvement in function so he can take care of his property and complete all household responsibilities;        Baseline: 02/26/23: Unable to test (90 degrees PROM for both), 05/28/23: L shoulder AROM against gravity: flexion: 176, no pain, scaption: 130, pain/burning; 07/31/23: see note; 10/12/23: flexion: 156 with pain, scaption: 84 with pain Goal status: PARTIALLY MET   PLAN: PT FREQUENCY: 1-2x/week  PT DURATION: 12 weeks  PLANNED INTERVENTIONS: Therapeutic exercises, Therapeutic activity, Neuromuscular re-education, Balance training, Gait training, Patient/Family education, Self Care, Joint mobilization, Joint manipulation, Vestibular training, Canalith repositioning, Orthotic/Fit  training, DME instructions, Dry Needling, Electrical stimulation, Spinal manipulation, Spinal mobilization, Cryotherapy, Moist heat, Taping, Traction, Ultrasound, Ionotophoresis 4mg /ml Dexamethasone , Manual therapy, and Re-evaluation.  PLAN FOR NEXT SESSION: progress HEP, progress L shoulder ROM and strength per surgical protocol;  Selinda BIRCH Jeryl Umholtz PT, DPT, GCS  1:05 PM,10/12/23

## 2023-10-15 DIAGNOSIS — K625 Hemorrhage of anus and rectum: Secondary | ICD-10-CM | POA: Diagnosis not present

## 2023-10-15 DIAGNOSIS — K529 Noninfective gastroenteritis and colitis, unspecified: Secondary | ICD-10-CM | POA: Diagnosis not present

## 2023-10-16 NOTE — Telephone Encounter (Signed)
 Patient with diagnosis of afib on Xarelto  for anticoagulation.    Procedure: Flexible Sigmoidoscopy  Date of procedure: 11/02/23   CHA2DS2-VASc Score = 4   This indicates a 4.8% annual risk of stroke. The patient's score is based upon: CHF History: 1 HTN History: 1 Diabetes History: 0 Stroke History: 0 Vascular Disease History: 1 Age Score: 1 Gender Score: 0      CrCl 95 ml/min Platelet count 315  Patient has not had an Afib/aflutter ablation or Watchman within the last 3 months or DCCV within the last 30 days   Per office protocol, patient can hold Xarelto  for 2 days prior to procedure.    **This guidance is not considered finalized until pre-operative APP has relayed final recommendations.**

## 2023-10-16 NOTE — Telephone Encounter (Signed)
   Primary Cardiologist: Timothy Gollan, MD  Chart reviewed as part of pre-operative protocol coverage. Given past medical history and time since last visit, based on ACC/AHA guidelines, [name] would be at acceptable risk for the planned procedure without further cardiovascular testing.   Patient should contact our office if he is having new symptoms that are concerning from a cardiac perspective to arrange a follow-up appointment.    Per office protocol, patient can hold Xarelto  for 2 days prior to procedure and should resume as soon as hemodynamically stable post-procedure.  I will route this recommendation to the requesting party via Epic fax function and remove from pre-op pool.  Please call with questions.  Rosaline EMERSON Bane, NP-C 10/16/2023, 8:11 AM [address], [address] [place], [place] [address] Office [phone] Fax [phone]

## 2023-10-17 ENCOUNTER — Telehealth: Payer: Self-pay | Admitting: Cardiology

## 2023-10-17 NOTE — Telephone Encounter (Signed)
 Patient wanted to let Suzann Riddle, NP know he had his CT Cardiac score test performed if she needs to review before his appt in November.

## 2023-10-17 NOTE — Telephone Encounter (Signed)
 Pt made aware and verbalized understanding.   Riddle, Suzann, NP to Me (Selected Message)    10/17/23 12:36 PM I didn't order this test, and I don't recall us  talking about its importance. I would recommend he discuss the results with PCP (ordering provider).

## 2023-10-17 NOTE — Telephone Encounter (Signed)
 Spoke with the patient, who stated he recently had a CT calcium  score done and would like Suzann to review it prior to his appointment.  Will forward to NP

## 2023-10-18 ENCOUNTER — Ambulatory Visit: Attending: Orthopedic Surgery

## 2023-10-18 DIAGNOSIS — M25512 Pain in left shoulder: Secondary | ICD-10-CM | POA: Insufficient documentation

## 2023-10-18 DIAGNOSIS — M6281 Muscle weakness (generalized): Secondary | ICD-10-CM | POA: Diagnosis present

## 2023-10-18 DIAGNOSIS — G8929 Other chronic pain: Secondary | ICD-10-CM | POA: Diagnosis present

## 2023-10-18 NOTE — Therapy (Signed)
 OUTPATIENT PHYSICAL THERAPY SHOULDER TREATMENT  Patient Name: [name] MRN: [id number] DOB:1955-11-04, 68 y.o., male Today's Date: 10/18/2023   PT End of Session - 10/18/23 1345     Visit Number 18    Number of Visits 49    Date for Recertification  01/03/24    Authorization Type eval: 02/26/23, [employer] Appeal Approved [id number], [employer] Medicare 2025  CO:Ajdzi on AUTH  Copay: $20  OOP:$3900/$0 used  [name] is req  Ref #:[id number]    PT Start Time 1400    PT Stop Time 1445    PT Time Calculation (min) 45 min    Activity Tolerance Patient tolerated treatment well    Behavior During Therapy WFL for tasks assessed/performed         Past Medical History:  Diagnosis Date   (HFpEF) heart failure with preserved ejection fraction (HCC)    a.) TTE 12/26/2013: EF 45-50%, mild ant and antsept HK. mild MR. Mod dil LA. nl RV fxn. Rhythm was Afib; b.) TTE 07/04/2019: EF 55%, mid-apical anteroseptal HK, mild MAC, mild Ao sclerosis, G2DD, RVSP 45.3; c.) TTE 06/02/2022: EF 55-60%, no RWMAs, sev LAE, RVSP 37.9, mild-mod MR, mild AR, AoV sclerosis without stenosis, asc Ao 38 mm   Anxiety    Aortic atherosclerosis    Cardiomyopathy (in setting of Afib)    a.) TTE 12/26/2013: EF 45-50%; b.) TTE 07/04/2019: EF 55%; c.) TTE 06/02/2022: EF 55-60%   Chronic pain syndrome    a.) followed by pain management   Chronic, continuous use of opioids    a.) on COT; followed by pain management   Coronary artery disease 11/06/2014   a.) cCTA 11/06/2014: Ca2+ = 224 (74th %ile; LAD distribution)   Diverticulosis    DJD (degenerative joint disease) of knee    Former smoker    History of hiatal hernia    History of kidney stones 2012   Hyperlipidemia    Hyperplastic colon polyp    Hypertension    Inguinal hernia, left    Internal hemorrhoids    Intervertebral disc disorder with radiculopathy of lumbosacral region    Lesion of bone of lumbosacral spine    L5   Long term current use of amiodarone     Lower extremity  edema    Mixed hyperlipidemia    Morbid obesity (HCC)    Multiple falls    Myalgia due to statin    On rivaroxaban  therapy    Osteoarthritis    PAF (paroxysmal atrial fibrillation) (HCC)    a.) CHA2DS2VASc = 4 (age, HTN, CHF, vascular disease) as of 02/19/23;  b.) s/p DCCV 03/13/14 (200 J), 05/15/19 (150 J x 1, 200 J x2), 05/19/2022 (150 J x1, 200 J x2; pads changed to ant/lat postion with additional 200 J x 2 -> did not convert), 07/25/22 (200 J), 09/21/2022 (200 J); c.) s/p PVI ablation 11/17/2022; d.) rate/rhythm maintained on amiodarone ; chronically anticoagulated on rivaroxaban    Pernicious anemia    Prostate cancer (HCC)    Pulmonary nodule, right    a. 10/2014 Cardiac CTA: 7mm RLL nodule; b. 04/2015 CT Chest: stable 7mm RLL nodule. No new nodules; 02/2017 CTA Chest: stable, benign, 7mm RLL pulm nodule.   Rotator cuff tear, left    Sleep difficulties    a.) takes melatonin + trazodone  PRN   SVT (supraventricular tachycardia)    Tubular adenoma of colon    Past Surgical History:  Procedure Laterality Date   ATRIAL FIBRILLATION ABLATION N/A 11/17/2022   Procedure: ATRIAL  FIBRILLATION ABLATION; Location: [facility]; Surgeon: Gehi, Anil, MD   BICEPT TENODESIS Right 09/27/2021   Procedure: Right reverse shoulder arthroplasty, biceps tenodesis;  Surgeon: Tobie Priest, MD;  Location: [facility];  Service: Orthopedics;  Laterality: Right;   CARDIOVERSION N/A 05/15/2019   Procedure: CARDIOVERSION;  Surgeon: Perla Evalene PARAS, MD;  Location: [facility];  Service: Cardiovascular;  Laterality: N/A;   CARDIOVERSION N/A 03/13/2014   Procedure: CARDIOVERSION; Location: [facility]; Surgeon: Evalene Perla, MD   CARDIOVERSION N/A 03/24/2022   Procedure: CARDIOVERSION;  Surgeon: Perla Evalene PARAS, MD;  Location: [facility];  Service: Cardiovascular;  Laterality: N/A;   CARDIOVERSION N/A 05/19/2022   Procedure: CARDIOVERSION;  Surgeon: Perla Evalene PARAS, MD;  Location: [facility];  Service: Cardiovascular;  Laterality:  N/A;   CARDIOVERSION N/A 07/25/2022   Procedure: CARDIOVERSION;  Surgeon: Fernande Elspeth BROCKS, MD;  Location: [facility];  Service: Cardiovascular;  Laterality: N/A;   CARDIOVERSION N/A 09/21/2022   Procedure: CARDIOVERSION;  Surgeon: Fernande Elspeth BROCKS, MD;  Location: [facility];  Service: Cardiovascular;  Laterality: N/A;   CARPAL TUNNEL RELEASE Right 12/23/2014   Procedure: CARPAL TUNNEL RELEASE;  Surgeon: Kayla Pinal, MD;  Location: [facility];  Service: Orthopedics;  Laterality: Right;   CARPAL TUNNEL RELEASE Left 01/06/2015   Procedure: CARPAL TUNNEL RELEASE;  Surgeon: Kayla Pinal, MD;  Location: [facility];  Service: Orthopedics;  Laterality: Left;   COLONOSCOPY WITH PROPOFOL  N/A 10/08/2017   Procedure: COLONOSCOPY WITH PROPOFOL ;  Surgeon: Unk Corinn Skiff, MD;  Location: [facility];  Service: Gastroenterology;  Laterality: N/A;   COLONOSCOPY WITH PROPOFOL  N/A 06/07/2022   Procedure: COLONOSCOPY WITH PROPOFOL ;  Surgeon: Unk Corinn Skiff, MD;  Location: [facility];  Service: Gastroenterology;  Laterality: N/A;   ENTEROSCOPY  06/07/2022   Procedure: ENTEROSCOPY;  Surgeon: Unk Corinn Skiff, MD;  Location: [facility];  Service: Gastroenterology;;   ESOPHAGOGASTRODUODENOSCOPY (EGD) WITH PROPOFOL  N/A 06/07/2022   Procedure: ESOPHAGOGASTRODUODENOSCOPY (EGD) WITH PROPOFOL ;  Surgeon: Unk Corinn Skiff, MD;  Location: [facility];  Service: Gastroenterology;  Laterality: N/A;   GASTRIC BYPASS  01/17/2000   KNEE SURGERY Right    knee trauma x3   prostate seeding     REVERSE SHOULDER ARTHROPLASTY Right 09/27/2021   Procedure: Right reverse shoulder arthroplasty, biceps tenodesis;  Surgeon: Tobie Priest, MD;  Location: [facility];  Service: Orthopedics;  Laterality: Right;   REVERSE SHOULDER ARTHROPLASTY Left 02/20/2023   Procedure: Left reverse shoulder arthroplasty, biceps tenodesis;  Surgeon: Tobie Priest, MD;  Location: [facility];  Service: Orthopedics;  Laterality: Left;   SHOULDER  ARTHROSCOPY WITH ROTATOR CUFF REPAIR AND OPEN BICEPS TENODESIS Left 12/27/2021   Procedure: Left shoulder arthroscopic cuff repair (supraspinatus and subscapularis) with Regeneten Patch application;  Surgeon: Tobie Priest, MD;  Location: [facility];  Service: Orthopedics;  Laterality: Left;   Patient Active Problem List   Diagnosis Date Noted   Anticoagulated 09/14/2023   Acute diverticulitis 09/13/2023   Chronic diastolic CHF (congestive heart failure) (HCC) 09/13/2023   Symptomatic anemia 04/26/2022   Chronic pain of both knees 03/30/2022   Bilateral primary osteoarthritis of knee 03/30/2022   Shortness of breath 03/24/2022   Vitamin D  deficiency 02/24/2022   S/p reverse total shoulder arthroplasty 09/27/2021   Lesion of bone of lumbosacral spine (L5) 05/12/2021   Localized osteoarthritis of shoulder regions, bilateral 03/29/2021   Chronic pain of both shoulders 03/29/2021   Drug-induced myopathy 02/21/2021   Trigger finger of right hand 11/15/2020   Prostate cancer (HCC) 07/26/2020   Insomnia 08/01/2019   Primary  osteoarthritis of both wrists 02/17/2019   Trigger middle finger of left hand 02/17/2019   Chronic pain syndrome 02/17/2019   Chronic radicular lumbar pain 10/08/2018   Lumbar radiculopathy 10/08/2018   Lumbar degenerative disc disease 10/08/2018   Lumbar facet arthropathy 10/08/2018   Lumbar facet joint syndrome 10/08/2018   Intervertebral disc disorder with radiculopathy of lumbosacral region    Osteoarthritis of knee 11/30/2017   Osteoarthritis of wrist 11/30/2017   Pernicious anemia 05/22/2016   Paroxysmal atrial fibrillation 12/20/2015   Carpal tunnel syndrome 11/16/2014   DJD (degenerative joint disease) of knee 10/27/2014   Bilateral carpal tunnel syndrome 10/27/2014   Morbid obesity with BMI of 45.0-49.9, adult (HCC) 10/27/2014   Mixed hyperlipidemia 10/27/2014   H/O gastric bypass 03/20/2014   Hyperkalemia 03/20/2014   History of prostate cancer 12/26/2013    Essential hypertension 12/26/2013   Encounter for anticoagulation discussion and counseling 12/26/2013   BRBPR (bright red blood per rectum) 12/26/2013   PCP: Dr. Edman  REFERRING PROVIDER: Dr. Earnestine Blanch  REFERRING DIAG: [phone] (ICD-10-CM) - Complete rotator cuff tear or rupture of left shoulder, not specified as traumatic   RATIONALE FOR EVALUATION AND TREATMENT: Rehabilitation  THERAPY DIAG: Chronic left shoulder pain  Muscle weakness (generalized)  ONSET DATE: 02/20/23  FOLLOW-UP APPT SCHEDULED WITH REFERRING PROVIDER: Yes   FROM INITIAL EVALUATION SUBJECTIVE:                                                                                                                                                                                         SUBJECTIVE STATEMENT:  L reverse TSR 02/20/23  PERTINENT HISTORY:  MAMOUDOU Hill is a 68 y.o. male who initially underwent arthroscopic subscapularis repair and side-to-side supraspinatus repair with Regeneten patch application by Dr. Blanch 12/27/2021. He underwent post-surgical physical therapy with initial minimal improvement followed by worsening shoulder strength/function. Repeat MRI showed large, retracted subscapularis re-tear and supraspinatus re-tear with signs of rotator cuff arthropathy. Conservative measures including medications, physical therapy, and cortisone injections did not provide adequate relief. The patient elected to proceed with reverse shoulder arthroplasty and biceps tenodesis on 02/20/23. Intraoperative findings include retracted full-thickness subscapularis tear, partial-thickness supraspinatus tear, and biceps tendinopathy. Surgery was a standard deltopectoral incision under general anesthesia along with an interscalene block. Per op note, operative arm to remain in sling at all times except ROM exercises and hygiene. Can perform pendulums, elbow/wrist/hand ROM exercises. Passive ROM allowed to 90 FF and 30 ER. ASA  325mg  x 6 weeks for DVT ppx (pt states that he was actually instructed to start back on his Xarelto  and not to use ASA). Plan for PT  starting on POD #3-4. Pt denies any post-operative complications from surgery. Pt has a history of chronic back pain and has undergone multiple lumbar facet, medial branch radiofrequency ablations. He also has a past history of cardiomyopathy (in setting of Afib), DJD (degenerative joint disease) of knee, kidney stones, lower extremity edema, PAF (paroxysmal atrial fibrillation), pernicious anemia, prostate cancer, and a pulmonary nodule. He also has a past surgical history that includes R reverse TSR, knee surgery, gastric bypass (2002), prostate seeding, carpal tunnel release (Right, 12/23/2014; Left, 01/06/2015), and multiple cardioversions.   PAIN:  Pain Intensity: No resting pain upon arrival Pain location: L shoulder Pain Quality: Nerve pain Radiating: No  Numbness/Tingling: No Focal Weakness: Yes 24-hour pain behavior: Varies History of prior shoulder or neck/shoulder injury, pain, surgery, or therapy: Yes PT for L shoulder s/p RTC repair, history of R reverse TSR; Dominant hand: right Red flags Positive: history of prostate CA, Negative: chills/fever, night sweats, nausea, vomiting, unrelenting pain, unexplained weight gain/loss  PRECAUTIONS: Shoulder, per operative note: operative arm to remain in sling at all times except ROM exercises and hygiene. Can perform pendulums, elbow/wrist/hand ROM exercises. Passive ROM allowed to 90 FF and 30 ER.   General reverse TSR precautions include no combined shoulder extension, IR, and adduction. No shoulder and elbow AROM (bicep tenodesis) for at least the first 4 weeks   WEIGHT BEARING RESTRICTIONS: Yes no WB through LUE  FALLS: Has patient fallen in last 6 months? 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  Recent memory is intact.  Remote memory is intact.  Attention span and concentration are intact.  Expressive speech is intact.  Patient's fund of knowledge is within normal limits for educational level.    Gross Musculoskeletal Assessment Tremor: None Bulk: Normal Tone: Normal Incision is clean and dry with minimal blood staining on honeycomb dressing as well as minimal swelling around the incision. Skin is cool to touch and no signs of infection present.   Gait Deferred full gait assessment  Posture Forward head  with upper thoracic kyphosis and rounded shoulders  Cervical Screen Deferred  ROM ROM (Normal range in degrees)    Right (AROM) Left (PROM)  Shoulder    Flexion 160 90 (limited per protocol)  Extension    Abduction 180 90 (scaption)  External Rotation 88 0  Internal Rotation 70 stomach  Hands Behind Head    Hands Behind Back        Elbow    Flexion Full Full  Extension 0 0  Pronation WNL Full palm up  Supination WNL Full palm down  (* = pain; Blank rows = not tested)  UE MMT: MMT (out of 5) Right Left   Shoulder   Flexion 5   Extension    Abduction 5   External rotation 5   Internal rotation 5   Horizontal abduction    Horizontal adduction    Lower Trapezius  Rhomboids        Elbow  Flexion 5   Extension 5   Pronation 5   Supination 5       Wrist  Flexion 5 5  Extension 5 5  Radial deviation   5  Ulnar deviation  5      MCP  Flexion 5 5  Extension 5 5  Abduction 5 5  Adduction 5 5  (* = pain; Blank rows = not tested)  AROM AROM     Right Left  Shoulder    Flexion 180 160  Extension    Abduction 152 138  External Rotation 90 88  Internal Rotation 70 74  Hands Behind Head C6 C3  Hands Behind Back L1 Lateral waistline  (* = pain; Blank rows = not tested)  07/31/23 UE MMT: MMT (out of 5) Right Left   Shoulder   Flexion 5 4*  Abduction 5 4*  External rotation (seated) 5 4*  Internal rotation (seated) 4+ 4+*  Horizontal abduction Unable to lay prone Unable to lay prone  Horizontal adduction    Lower Trapezius    Rhomboids        Elbow  Flexion 5 5  Extension 5 5  Pronation 5 5  Supination 5 5*   Shoulder Flexion  Peak Force (lb) Right 21.09 lb Left 10.19 lb Strength Difference 10.90 lb* Percentage Difference 69.66%*  Shoulder Abduction Peak Force (lb) Right 14.57 lb Left 8.33 lb Strength Difference 6.24 lb* Percentage Difference 54.48%*  Shoulder External Rotation Peak Force (lb) Right 20.28 lb Left 16.90 lb Strength Difference 3.37 lb* Percentage Difference 18.15%*  Shoulder Internal Rotation Peak Force (lb) Right 19.77 lb Left 20.43 lb Strength Difference 0.66 lb Percentage Difference 3.28%  10/11/23: AROM AROM     Right Left  Shoulder    Flexion  156  Extension    Abduction/Scaption  84*  External Rotation  50*  Internal Rotation  54*  Hands Behind Head    Hands Behind Back    (* = pain; Blank rows = not tested)  UE MMT: MMT (out of 5) Right Left   Shoulder   Flexion  4*  Abduction  4*  External rotation (seated)  4*  Internal rotation (seated)  4+*  Horizontal abduction  Unable to lay prone  Horizontal adduction    Lower Trapezius    Rhomboids        Elbow  Flexion  5  Extension  5  Pronation  5  Supination  5     TODAY'S TREATMENT: 10/18/2023    Subjective:  Pt states that he was sore for about 3 days  after the last therapy session. Ongoing L shoulder pain. No specific questions or concerns currently.   PAIN: Burning/ache    Ther-ex  In semi-recumbent position: L shoulder flexion with 2# dumbbell 2 x 10; L shoulder scaption, unweighted, 2 x 10; L shoulder overhead press with 2# DB x 10; L shoulder manually resisted ER 2 x 10; L shoulder manually resisted IR 2 x 10; L elbow curl with 2# DB 2 x 10; L elbow manually resisted extension 2 x 10; Dowel chest press with manual resistance from therapist 2 x 10; Dowel rows with manual resistance 2 x 10; L shoulder isometric abduction and extension 2 x 10 each;  Seated shoulder rolls 2 x 10; Seated scapular retractions 2 x 10; Seated W's with green tband 2 x 10;   Manual Therapy  STM to L upper  trap, cervical paraspinals, and rhomboids in sitting;   Not performed: Nautilus lat pull downs with hand grips 95# 3 x 10; Nautilus rows with hand grips 50# 3 x 10; Seated pulleys for L shoulder AAROM overhead flexion and abduction x multiple bouts each direction;   HOME EXERCISE PROGRAM:  Access Code: [email or web address] URL: [email or web address] Date: 07/31/2023 Prepared by: Selinda Eck  Exercises - Seated Cervical Sidebending Stretch (Mirrored)  - 2 x daily - 7 x weekly - 3 reps - 30s hold - Seated Scapular Retraction  - 2 x daily - 7 x weekly - 2 sets - 10 reps - 3s hold - Standing Shoulder Flexion Full Range Single Arm  - 1 x daily - 7 x weekly - 2 sets - 10 reps - 3s hold - Standing Shoulder Abduction Full Range Single Arm  - 1 x daily - 7 x weekly - 2 sets - 10 reps - 3s hold - Wrist Flexion AROM  - 2 x daily - 7 x weekly - 2 sets - 10 reps - 3s hold - Wrist Extension AROM  - 1 x daily - 7 x weekly - 2 sets - 10 reps - 3s hold - Shoulder External Rotation and Scapular Retraction with Resistance  - 1 x daily - 7 x weekly - 2 sets - 10 reps - 3s hold    ASSESSMENT: Progressed strengthening with patient during session  today. Less pain noted during exercises compared to last session. Also performed STM to L upper quarter due to ongoing soreness. Mr. Erker would benefit from ongoing PT to prevent additional decline, regain L shoulder strength, and decrease pain. Pt encouraged to continue HEP and follow up as directed. He will continue to benefit from skilled therapy to address remaining deficits in L shoulder weakness and pain order to improve quality of life and return to PLOF.     OBJECTIVE IMPAIRMENTS: decreased ROM, decreased strength, and pain.   ACTIVITY LIMITATIONS: carrying, lifting, bathing, toileting, dressing, and reach over head  PARTICIPATION LIMITATIONS: meal prep, cleaning, laundry, driving, shopping, community activity, and yard work  PERSONAL FACTORS: Past/current experiences, Time since onset of injury/illness/exacerbation, and 3+ comorbidities: OA, anemia, anxiety, cardiomyopathy, chronic pain are also affecting patient's functional outcome.   REHAB POTENTIAL: Good  CLINICAL DECISION MAKING: Unstable/unpredictable  EVALUATION COMPLEXITY: High   GOALS: Goals reviewed with patient? Yes  SHORT TERM GOALS: Target date: 07/09/2023   Pt will be independent with HEP to improve strength and decrease shoulder pain to improve pain-free function at home and work. Baseline: Issued and progressing Goal status: ONGOING   LONG TERM GOALS: Target date: 08/20/2023  1.  Pt will report no further L shoulder pain with all AROM in order to complete all ADLs and improve pain-free function. Baseline: 05/28/23: worst pain 7/10; 07/31/23: 10/10; 10/12/23: 8/10; Goal status: ONGOING  2.  Pt will decrease QuickDASH score to below 30% in order to demonstrate clinically significant reduction in disability related to shoulder pain        Baseline: 02/26/23: 72.7%; 05/28/23: 40.9%; 07/31/23: 56.8%; 10/11/23: 59.1% Goal status: PARTIALLY MET  3. Pt will increase strength of pain-free L shoulder flexion and scaption  to at least 4/5 in order to demonstrate improvement in strength and function         Baseline: 02/26/23: Unable to test; 05/28/23: flexion: 4+/5, pain free, scaption: 4/5, pain; 07/31/23: see note above, painful; 10/11/23:  flexion: 4/5 with pain, scaption: 4/5 with pain; Goal status: PARTIALLY MET  4. Pt  will improve L shoulder AROM flexion and scaption to at least 110 degrees in order to demonstrate improvement in function so he can take care of his property and complete all household responsibilities;        Baseline: 02/26/23: Unable to test (90 degrees PROM for both), 05/28/23: L shoulder AROM against gravity: flexion: 176, no pain, scaption: 130, pain/burning; 07/31/23: see note; 10/12/23: flexion: 156 with pain, scaption: 84 with pain Goal status: PARTIALLY MET   PLAN: PT FREQUENCY: 1-2x/week  PT DURATION: 12 weeks  PLANNED INTERVENTIONS: Therapeutic exercises, Therapeutic activity, Neuromuscular re-education, Balance training, Gait training, Patient/Family education, Self Care, Joint mobilization, Joint manipulation, Vestibular training, Canalith repositioning, Orthotic/Fit training, DME instructions, Dry Needling, Electrical stimulation, Spinal manipulation, Spinal mobilization, Cryotherapy, Moist heat, Taping, Traction, Ultrasound, Ionotophoresis 4mg /ml Dexamethasone , Manual therapy, and Re-evaluation.  PLAN FOR NEXT SESSION: progress HEP, progress L shoulder ROM and strength per surgical protocol;  Selinda BIRCH Itzamar Traynor PT, DPT, GCS  5:31 PM,10/18/23

## 2023-10-20 ENCOUNTER — Other Ambulatory Visit: Payer: Self-pay | Admitting: Family Medicine

## 2023-10-20 DIAGNOSIS — D510 Vitamin B12 deficiency anemia due to intrinsic factor deficiency: Secondary | ICD-10-CM

## 2023-10-22 ENCOUNTER — Ambulatory Visit

## 2023-10-22 DIAGNOSIS — M7522 Bicipital tendinitis, left shoulder: Secondary | ICD-10-CM | POA: Diagnosis not present

## 2023-10-22 DIAGNOSIS — G8929 Other chronic pain: Secondary | ICD-10-CM

## 2023-10-22 DIAGNOSIS — M6281 Muscle weakness (generalized): Secondary | ICD-10-CM

## 2023-10-22 DIAGNOSIS — M25512 Pain in left shoulder: Secondary | ICD-10-CM | POA: Diagnosis not present

## 2023-10-22 NOTE — Telephone Encounter (Signed)
 Requested medications are due for refill today.  unsure  Requested medications are on the active medications list.  yes  Last refill. 09/27/2022  Future visit scheduled.   no  Notes to clinic.  Rx was Written 09/27/2022 for 1mL and prn refills. Please review    Requested Prescriptions  Pending Prescriptions Disp Refills   cyanocobalamin  (VITAMIN B12) 1000 MCG/ML injection [Pharmacy Med Name: CYANOCOBAL 1,000MCG/ML (1X25)] 1 mL PRN    Sig: INJECT INTO THE SKIN EVERY 30 DAYS AS DIRECTED     Endocrinology:  Vitamins - Vitamin B12 Failed - 10/22/2023  4:13 PM      Failed - B12 Level in normal range and within 360 days    Vitamin B-12  Date Value Ref Range Status  04/26/2022 486 180 - 914 pg/mL Final    Comment:    (NOTE) This assay is not validated for testing neonatal or myeloproliferative syndrome specimens for Vitamin B12 levels. Performed at [facility], [address], [place], [place] [address]          Passed - HCT in normal range and within 360 days    HCT  Date Value Ref Range Status  09/19/2023 41.2 38.5 - 50.0 % Final   Hematocrit  Date Value Ref Range Status  08/31/2022 44.1 37.5 - 51.0 % Final         Passed - HGB in normal range and within 360 days    Hemoglobin  Date Value Ref Range Status  09/19/2023 13.5 13.2 - 17.1 g/dL Final  93/88/7974 85.4 13.0 - 17.0 g/dL Final  91/84/7975 85.7 13.0 - 17.7 g/dL Final         Passed - Valid encounter within last 12 months    Recent Outpatient Visits           1 month ago Acute diverticulitis   [facility], Marsa PARAS, DO   2 months ago Annual physical exam   [facility], Marsa PARAS, DO   5 months ago Acute cystitis with hematuria    [facility] Marsa PARAS, DO       Future Appointments             In 1 month Riddle, Suzann, NP [facility]

## 2023-10-22 NOTE — Therapy (Signed)
 OUTPATIENT PHYSICAL THERAPY SHOULDER TREATMENT  Patient Name: [name] MRN: [id number] DOB:04-Oct-1955, 68 y.o., male Today's Date: 10/22/2023   PT End of Session - 10/22/23 1439     Visit Number 19    Number of Visits 49    Date for Recertification  01/03/24    Authorization Type eval: 02/26/23, [employer] Appeal Approved [id number], [employer] Medicare 2025  CO:[place] on AUTH  Copay: $20  OOP:$3900/$0 used  Auth is req  Ref #:[id number]    PT Start Time 1400    PT Stop Time 1424    PT Time Calculation (min) 24 min    Activity Tolerance Patient tolerated treatment well    Behavior During Therapy WFL for tasks assessed/performed          Past Medical History:  Diagnosis Date   (HFpEF) heart failure with preserved ejection fraction (HCC)    a.) TTE 12/26/2013: EF 45-50%, mild ant and antsept HK. mild MR. Mod dil LA. nl RV fxn. Rhythm was Afib; b.) TTE 07/04/2019: EF 55%, mid-apical anteroseptal HK, mild MAC, mild Ao sclerosis, G2DD, RVSP 45.3; c.) TTE 06/02/2022: EF 55-60%, no RWMAs, sev LAE, RVSP 37.9, mild-mod MR, mild AR, AoV sclerosis without stenosis, asc Ao 38 mm   Anxiety    Aortic atherosclerosis    Cardiomyopathy (in setting of Afib)    a.) TTE 12/26/2013: EF 45-50%; b.) TTE 07/04/2019: EF 55%; c.) TTE 06/02/2022: EF 55-60%   Chronic pain syndrome    a.) followed by pain management   Chronic, continuous use of opioids    a.) on COT; followed by pain management   Coronary artery disease 11/06/2014   a.) cCTA 11/06/2014: Ca2+ = 224 (74th %ile; LAD distribution)   Diverticulosis    DJD (degenerative joint disease) of knee    Former smoker    History of hiatal hernia    History of kidney stones 2012   Hyperlipidemia    Hyperplastic colon polyp    Hypertension    Inguinal hernia, left    Internal hemorrhoids    Intervertebral disc disorder with radiculopathy of lumbosacral region    Lesion of bone of lumbosacral spine    L5   Long term current use of amiodarone     Lower  extremity edema    Mixed hyperlipidemia    Morbid obesity (HCC)    Multiple falls    Myalgia due to statin    On rivaroxaban  therapy    Osteoarthritis    PAF (paroxysmal atrial fibrillation) (HCC)    a.) CHA2DS2VASc = 4 (age, HTN, CHF, vascular disease) as of 02/19/23;  b.) s/p DCCV 03/13/14 (200 J), 05/15/19 (150 J x 1, 200 J x2), 05/19/2022 (150 J x1, 200 J x2; pads changed to ant/lat postion with additional 200 J x 2 -> did not convert), 07/25/22 (200 J), 09/21/2022 (200 J); c.) s/p PVI ablation 11/17/2022; d.) rate/rhythm maintained on amiodarone ; chronically anticoagulated on rivaroxaban    Pernicious anemia    Prostate cancer (HCC)    Pulmonary nodule, right    a. 10/2014 Cardiac CTA: 7mm RLL nodule; b. 04/2015 CT Chest: stable 7mm RLL nodule. No new nodules; 02/2017 CTA Chest: stable, benign, 7mm RLL pulm nodule.   Rotator cuff tear, left    Sleep difficulties    a.) takes melatonin + trazodone  PRN   SVT (supraventricular tachycardia)    Tubular adenoma of colon    Past Surgical History:  Procedure Laterality Date   ATRIAL FIBRILLATION ABLATION N/A 11/17/2022   Procedure:  ATRIAL FIBRILLATION ABLATION; Location: [facility]; Surgeon: Gehi, Anil, MD   BICEPT TENODESIS Right 09/27/2021   Procedure: Right reverse shoulder arthroplasty, biceps tenodesis;  Surgeon: Tobie Priest, MD;  Location: [facility];  Service: Orthopedics;  Laterality: Right;   CARDIOVERSION N/A 05/15/2019   Procedure: CARDIOVERSION;  Surgeon: Perla Evalene PARAS, MD;  Location: [facility];  Service: Cardiovascular;  Laterality: N/A;   CARDIOVERSION N/A 03/13/2014   Procedure: CARDIOVERSION; Location: [facility]; Surgeon: Evalene Perla, MD   CARDIOVERSION N/A 03/24/2022   Procedure: CARDIOVERSION;  Surgeon: Perla Evalene PARAS, MD;  Location: [facility];  Service: Cardiovascular;  Laterality: N/A;   CARDIOVERSION N/A 05/19/2022   Procedure: CARDIOVERSION;  Surgeon: Perla Evalene PARAS, MD;  Location: [facility];  Service: Cardiovascular;   Laterality: N/A;   CARDIOVERSION N/A 07/25/2022   Procedure: CARDIOVERSION;  Surgeon: Fernande Elspeth BROCKS, MD;  Location: [facility];  Service: Cardiovascular;  Laterality: N/A;   CARDIOVERSION N/A 09/21/2022   Procedure: CARDIOVERSION;  Surgeon: Fernande Elspeth BROCKS, MD;  Location: [facility];  Service: Cardiovascular;  Laterality: N/A;   CARPAL TUNNEL RELEASE Right 12/23/2014   Procedure: CARPAL TUNNEL RELEASE;  Surgeon: Kayla Pinal, MD;  Location: [facility];  Service: Orthopedics;  Laterality: Right;   CARPAL TUNNEL RELEASE Left 01/06/2015   Procedure: CARPAL TUNNEL RELEASE;  Surgeon: Kayla Pinal, MD;  Location: [facility];  Service: Orthopedics;  Laterality: Left;   COLONOSCOPY WITH PROPOFOL  N/A 10/08/2017   Procedure: COLONOSCOPY WITH PROPOFOL ;  Surgeon: Unk Corinn Skiff, MD;  Location: [facility];  Service: Gastroenterology;  Laterality: N/A;   COLONOSCOPY WITH PROPOFOL  N/A 06/07/2022   Procedure: COLONOSCOPY WITH PROPOFOL ;  Surgeon: Unk Corinn Skiff, MD;  Location: [facility];  Service: Gastroenterology;  Laterality: N/A;   ENTEROSCOPY  06/07/2022   Procedure: ENTEROSCOPY;  Surgeon: Unk Corinn Skiff, MD;  Location: [facility]COPY;  Service: Gastroenterology;;   ESOPHAGOGASTRODUODENOSCOPY (EGD) WITH PROPOFOL  N/A 06/07/2022   Procedure: ESOPHAGOGASTRODUODENOSCOPY (EGD) WITH PROPOFOL ;  Surgeon: Unk Corinn Skiff, MD;  Location: [facility]COPY;  Service: Gastroenterology;  Laterality: N/A;   GASTRIC BYPASS  01/17/2000   KNEE SURGERY Right    knee trauma x3   prostate seeding     REVERSE SHOULDER ARTHROPLASTY Right 09/27/2021   Procedure: Right reverse shoulder arthroplasty, biceps tenodesis;  Surgeon: Tobie Priest, MD;  Location: [facility];  Service: Orthopedics;  Laterality: Right;   REVERSE SHOULDER ARTHROPLASTY Left 02/20/2023   Procedure: Left reverse shoulder arthroplasty, biceps tenodesis;  Surgeon: Tobie Priest, MD;  Location: [facility];  Service: Orthopedics;  Laterality: Left;    SHOULDER ARTHROSCOPY WITH ROTATOR CUFF REPAIR AND OPEN BICEPS TENODESIS Left 12/27/2021   Procedure: Left shoulder arthroscopic cuff repair (supraspinatus and subscapularis) with Regeneten Patch application;  Surgeon: Tobie Priest, MD;  Location: [facility];  Service: Orthopedics;  Laterality: Left;   Patient Active Problem List   Diagnosis Date Noted   Anticoagulated 09/14/2023   Acute diverticulitis 09/13/2023   Chronic diastolic CHF (congestive heart failure) (HCC) 09/13/2023   Symptomatic anemia 04/26/2022   Chronic pain of both knees 03/30/2022   Bilateral primary osteoarthritis of knee 03/30/2022   Shortness of breath 03/24/2022   Vitamin D  deficiency 02/24/2022   S/p reverse total shoulder arthroplasty 09/27/2021   Lesion of bone of lumbosacral spine (L5) 05/12/2021   Localized osteoarthritis of shoulder regions, bilateral 03/29/2021   Chronic pain of both shoulders 03/29/2021   Drug-induced myopathy 02/21/2021   Trigger finger of right hand 11/15/2020   Prostate cancer (HCC) 07/26/2020   Insomnia 08/01/2019  Primary osteoarthritis of both wrists 02/17/2019   Trigger middle finger of left hand 02/17/2019   Chronic pain syndrome 02/17/2019   Chronic radicular lumbar pain 10/08/2018   Lumbar radiculopathy 10/08/2018   Lumbar degenerative disc disease 10/08/2018   Lumbar facet arthropathy 10/08/2018   Lumbar facet joint syndrome 10/08/2018   Intervertebral disc disorder with radiculopathy of lumbosacral region    Osteoarthritis of knee 11/30/2017   Osteoarthritis of wrist 11/30/2017   Pernicious anemia 05/22/2016   Paroxysmal atrial fibrillation 12/20/2015   Carpal tunnel syndrome 11/16/2014   DJD (degenerative joint disease) of knee 10/27/2014   Bilateral carpal tunnel syndrome 10/27/2014   Morbid obesity with BMI of 45.0-49.9, adult (HCC) 10/27/2014   Mixed hyperlipidemia 10/27/2014   H/O gastric bypass 03/20/2014   Hyperkalemia 03/20/2014   History of prostate cancer  12/26/2013   Essential hypertension 12/26/2013   Encounter for anticoagulation discussion and counseling 12/26/2013   BRBPR (bright red blood per rectum) 12/26/2013   PCP: Dr. Edman  REFERRING PROVIDER: Dr. Earnestine Blanch  REFERRING DIAG: [phone] (ICD-10-CM) - Complete rotator cuff tear or rupture of left shoulder, not specified as traumatic   RATIONALE FOR EVALUATION AND TREATMENT: Rehabilitation  THERAPY DIAG: Chronic left shoulder pain  Muscle weakness (generalized)  ONSET DATE: 02/20/23  FOLLOW-UP APPT SCHEDULED WITH REFERRING PROVIDER: Yes   FROM INITIAL EVALUATION SUBJECTIVE:                                                                                                                                                                                         SUBJECTIVE STATEMENT:  L reverse TSR 02/20/23  PERTINENT HISTORY:  [name] is a 68 y.o. male who initially underwent arthroscopic subscapularis repair and side-to-side supraspinatus repair with Regeneten patch application by Dr. Blanch 12/27/2021. He underwent post-surgical physical therapy with initial minimal improvement followed by worsening shoulder strength/function. Repeat MRI showed large, retracted subscapularis re-tear and supraspinatus re-tear with signs of rotator cuff arthropathy. Conservative measures including medications, physical therapy, and cortisone injections did not provide adequate relief. The patient elected to proceed with reverse shoulder arthroplasty and biceps tenodesis on 02/20/23. Intraoperative findings include retracted full-thickness subscapularis tear, partial-thickness supraspinatus tear, and biceps tendinopathy. Surgery was a standard deltopectoral incision under general anesthesia along with an interscalene block. Per op note, operative arm to remain in sling at all times except ROM exercises and hygiene. Can perform pendulums, elbow/wrist/hand ROM exercises. Passive ROM allowed to 90 FF and 30  ER. ASA 325mg  x 6 weeks for DVT ppx (pt states that he was actually instructed to start back on his Xarelto  and not to use ASA). Plan for  PT starting on POD #3-4. Pt denies any post-operative complications from surgery. Pt has a history of chronic back pain and has undergone multiple lumbar facet, medial branch radiofrequency ablations. He also has a past history of cardiomyopathy (in setting of Afib), DJD (degenerative joint disease) of knee, kidney stones, lower extremity edema, PAF (paroxysmal atrial fibrillation), pernicious anemia, prostate cancer, and a pulmonary nodule. He also has a past surgical history that includes R reverse TSR, knee surgery, gastric bypass (2002), prostate seeding, carpal tunnel release (Right, 12/23/2014; Left, 01/06/2015), and multiple cardioversions.   PAIN:  Pain Intensity: No resting pain upon arrival Pain location: L shoulder Pain Quality: Nerve pain Radiating: No  Numbness/Tingling: No Focal Weakness: Yes 24-hour pain behavior: Varies History of prior shoulder or neck/shoulder injury, pain, surgery, or therapy: Yes PT for L shoulder s/p RTC repair, history of R reverse TSR; Dominant hand: right Red flags Positive: history of prostate CA, Negative: chills/fever, night sweats, nausea, vomiting, unrelenting pain, unexplained weight gain/loss  PRECAUTIONS: Shoulder, per operative note: operative arm to remain in sling at all times except ROM exercises and hygiene. Can perform pendulums, elbow/wrist/hand ROM exercises. Passive ROM allowed to 90 FF and 30 ER.   General reverse TSR precautions include no combined shoulder extension, IR, and adduction. No shoulder and elbow AROM (bicep tenodesis) for at least the first 4 weeks   WEIGHT BEARING RESTRICTIONS: Yes no WB through LUE  FALLS: Has patient fallen in last 6 months? 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  Recent memory is intact.  Remote memory is intact.  Attention span and concentration are intact.  Expressive speech is intact.  Patient's fund of knowledge is within normal limits for educational level.    Gross Musculoskeletal Assessment Tremor: None Bulk: Normal Tone: Normal Incision is clean and dry with minimal blood staining on honeycomb dressing as well as minimal swelling around the incision. Skin is cool to touch and no signs of infection present.   Gait Deferred full gait assessment  Posture Forward head  with upper thoracic kyphosis and rounded shoulders  Cervical Screen Deferred  ROM ROM (Normal range in degrees)    Right (AROM) Left (PROM)  Shoulder    Flexion 160 90 (limited per protocol)  Extension    Abduction 180 90 (scaption)  External Rotation 88 0  Internal Rotation 70 stomach  Hands Behind Head    Hands Behind Back        Elbow    Flexion Full Full  Extension 0 0  Pronation WNL Full palm up  Supination WNL Full palm down  (* = pain; Blank rows = not tested)  UE MMT: MMT (out of 5) Right Left   Shoulder   Flexion 5   Extension    Abduction 5   External rotation 5   Internal rotation 5   Horizontal abduction    Horizontal adduction    Lower Trapezius  Rhomboids        Elbow  Flexion 5   Extension 5   Pronation 5   Supination 5       Wrist  Flexion 5 5  Extension 5 5  Radial deviation   5  Ulnar deviation  5      MCP  Flexion 5 5  Extension 5 5  Abduction 5 5  Adduction 5 5  (* = pain; Blank rows = not tested)  AROM AROM     Right Left  Shoulder    Flexion 180 160  Extension    Abduction 152 138  External Rotation 90 88  Internal Rotation 70 74  Hands Behind Head C6 C3  Hands Behind Back L1 Lateral waistline  (* = pain; Blank rows = not tested)  07/31/23 UE MMT: MMT (out of 5) Right Left   Shoulder   Flexion 5 4*  Abduction 5 4*  External rotation (seated) 5 4*  Internal rotation (seated) 4+ 4+*  Horizontal abduction Unable to lay prone Unable to lay prone  Horizontal adduction    Lower Trapezius    Rhomboids        Elbow  Flexion 5 5  Extension 5 5  Pronation 5 5  Supination 5 5*   Shoulder Flexion  Peak Force (lb) Right 21.09 lb Left 10.19 lb Strength Difference 10.90 lb* Percentage Difference 69.66%*  Shoulder Abduction Peak Force (lb) Right 14.57 lb Left 8.33 lb Strength Difference 6.24 lb* Percentage Difference 54.48%*  Shoulder External Rotation Peak Force (lb) Right 20.28 lb Left 16.90 lb Strength Difference 3.37 lb* Percentage Difference 18.15%*  Shoulder Internal Rotation Peak Force (lb) Right 19.77 lb Left 20.43 lb Strength Difference 0.66 lb Percentage Difference 3.28%  10/11/23: AROM AROM     Right Left  Shoulder    Flexion  156  Extension    Abduction/Scaption  84*  External Rotation  50*  Internal Rotation  54*  Hands Behind Head    Hands Behind Back    (* = pain; Blank rows = not tested)  UE MMT: MMT (out of 5) Right Left   Shoulder   Flexion  4*  Abduction  4*  External rotation (seated)  4*  Internal rotation (seated)  4+*  Horizontal abduction  Unable to lay prone  Horizontal adduction    Lower Trapezius    Rhomboids        Elbow  Flexion  5  Extension  5  Pronation  5  Supination  5     TODAY'S TREATMENT: 10/22/2023    Subjective:  Pt states that he saw the orthopedic surgeon  this morning who wanted him to get another steroid injection and then possibly consider a CT of his shoulder. He reports that plain film radiographs this morning were WNL and labwork was drawn. He complains of ongoing L shoulder pain and loss of ROM. No specific questions currently.   PAIN: Burning/ache    Ther-ex  In semi-recumbent position: L shoulder flexion with 2# dumbbell 2 x 10; L shoulder scaption, unweighted, 2 x 10; L shoulder overhead press with 2# DB x 10; L shoulder manually resisted ER 2 x 10; L shoulder manually resisted IR 2 x 10; L elbow curl with with manual resistance 2 x 10; L elbow manually resisted extension 2 x 10; Dowel chest press with manual resistance from therapist x 10;   Manual Therapy  STM to L upper/mid/low trap, cervical paraspinals, and rhomboids in sitting;  Not performed: Nautilus lat pull downs with hand grips 95# 3 x 10; Nautilus rows with hand grips 50# 3 x 10; Seated pulleys for L shoulder AAROM overhead flexion and abduction x multiple bouts each direction; Dowel rows with manual resistance 2 x 10; L shoulder isometric abduction and extension 2 x 10 each; Seated shoulder rolls 2 x 10; Seated scapular retractions 2 x 10; Seated W's with green tband 2 x 10;   HOME EXERCISE PROGRAM:  Access Code: [email or web address] URL: [email or web address] Date: 07/31/2023 Prepared by: Selinda Eck  Exercises - Seated Cervical Sidebending Stretch (Mirrored)  - 2 x daily - 7 x weekly - 3 reps - 30s hold - Seated Scapular Retraction  - 2 x daily - 7 x weekly - 2 sets - 10 reps - 3s hold - Standing Shoulder Flexion Full Range Single Arm  - 1 x daily - 7 x weekly - 2 sets - 10 reps - 3s hold - Standing Shoulder Abduction Full Range Single Arm  - 1 x daily - 7 x weekly - 2 sets - 10 reps - 3s hold - Wrist Flexion AROM  - 2 x daily - 7 x weekly - 2 sets - 10 reps - 3s hold - Wrist Extension AROM  - 1 x daily - 7 x weekly - 2 sets - 10 reps - 3s  hold - Shoulder External Rotation and Scapular Retraction with Resistance  - 1 x daily - 7 x weekly - 2 sets - 10 reps - 3s hold    ASSESSMENT: Continued strengthening with patient during session today. Still unable to progress weight due to pain and weakness. Also performed STM to L upper quarter due to ongoing soreness. Session cut short because of pt discomfort related to heat in the clinic given power failure. Mr. Baxendale would benefit from ongoing PT to prevent additional decline, regain L shoulder strength, and decrease pain. Pt encouraged to continue HEP and follow up as directed. He will continue to benefit from skilled therapy to address remaining deficits in L shoulder weakness and pain order to improve quality of life and return to PLOF.     OBJECTIVE IMPAIRMENTS: decreased ROM, decreased strength, and pain.   ACTIVITY LIMITATIONS: carrying, lifting, bathing, toileting, dressing, and reach over head  PARTICIPATION LIMITATIONS: meal prep, cleaning, laundry, driving, shopping, community activity, and yard work  PERSONAL FACTORS: Past/current experiences, Time since onset of injury/illness/exacerbation, and 3+ comorbidities: OA, anemia, anxiety, cardiomyopathy, chronic pain are also affecting patient's functional outcome.   REHAB POTENTIAL: Good  CLINICAL DECISION MAKING: Unstable/unpredictable  EVALUATION COMPLEXITY: High   GOALS: Goals reviewed with patient? Yes  SHORT TERM GOALS: Target date: 07/09/2023   Pt will be independent with HEP to improve strength and decrease shoulder pain to improve pain-free function at home and work. Baseline: Issued and progressing Goal status: ONGOING   LONG TERM GOALS: Target date: 08/20/2023  1.  Pt will report no further L shoulder pain with all AROM in order to complete all ADLs and improve pain-free function. Baseline: 05/28/23: worst pain 7/10; 07/31/23: 10/10; 10/12/23: 8/10; Goal status: ONGOING  2.  Pt will decrease QuickDASH score to  below 30% in order to demonstrate clinically significant reduction in disability related to shoulder pain        Baseline: 02/26/23: 72.7%; 05/28/23: 40.9%; 07/31/23: 56.8%; 10/11/23: 59.1% Goal status: PARTIALLY MET  3. Pt will increase strength of pain-free L shoulder flexion and scaption to at least 4/5 in order to demonstrate improvement in  strength and function         Baseline: 02/26/23: Unable to test; 05/28/23: flexion: 4+/5, pain free, scaption: 4/5, pain; 07/31/23: see note above, painful; 10/11/23:  flexion: 4/5 with pain, scaption: 4/5 with pain; Goal status: PARTIALLY MET  4. Pt will improve L shoulder AROM flexion and scaption to at least 110 degrees in order to demonstrate improvement in function so he can take care of his property and complete all household responsibilities;        Baseline: 02/26/23: Unable to test (90 degrees PROM for both), 05/28/23: L shoulder AROM against gravity: flexion: 176, no pain, scaption: 130, pain/burning; 07/31/23: see note; 10/12/23: flexion: 156 with pain, scaption: 84 with pain Goal status: PARTIALLY MET   PLAN: PT FREQUENCY: 1-2x/week  PT DURATION: 12 weeks  PLANNED INTERVENTIONS: Therapeutic exercises, Therapeutic activity, Neuromuscular re-education, Balance training, Gait training, Patient/Family education, Self Care, Joint mobilization, Joint manipulation, Vestibular training, Canalith repositioning, Orthotic/Fit training, DME instructions, Dry Needling, Electrical stimulation, Spinal manipulation, Spinal mobilization, Cryotherapy, Moist heat, Taping, Traction, Ultrasound, Ionotophoresis 4mg /ml Dexamethasone , Manual therapy, and Re-evaluation.  PLAN FOR NEXT SESSION: progress HEP, progress L shoulder ROM and strength per surgical protocol;  Selinda BIRCH Vanity Larsson PT, DPT, GCS  5:21 PM,10/22/23

## 2023-10-25 ENCOUNTER — Ambulatory Visit

## 2023-10-25 DIAGNOSIS — M25512 Pain in left shoulder: Secondary | ICD-10-CM | POA: Diagnosis not present

## 2023-10-25 DIAGNOSIS — G8929 Other chronic pain: Secondary | ICD-10-CM

## 2023-10-25 DIAGNOSIS — M6281 Muscle weakness (generalized): Secondary | ICD-10-CM

## 2023-10-25 NOTE — Therapy (Signed)
 OUTPATIENT PHYSICAL THERAPY SHOULDER TREATMENT/PROGRESS NOTE  Dates of reporting period  05/28/23   to   10/25/23   Patient Name: [name] MRN: [id number] DOB:12/16/1955, 67 y.o., male Today's Date: 10/26/2023   PT End of Session - 10/26/23 0841     Visit Number 20    Number of Visits 49    Date for Recertification  01/03/24    Authorization Type eval: 02/26/23, [employer] Appeal Approved [id number], [employer] Medicare 2025  CO:[place] on AUTH  Copay: $20  OOP:$3900/$0 used  [name] is req  Ref #:[id number]    PT Start Time 1530    PT Stop Time 1615    PT Time Calculation (min) 45 min    Activity Tolerance Patient tolerated treatment well    Behavior During Therapy WFL for tasks assessed/performed         Past Medical History:  Diagnosis Date   (HFpEF) heart failure with preserved ejection fraction (HCC)    a.) TTE 12/26/2013: EF 45-50%, mild ant and antsept HK. mild MR. Mod dil LA. nl RV fxn. Rhythm was Afib; b.) TTE 07/04/2019: EF 55%, mid-apical anteroseptal HK, mild MAC, mild Ao sclerosis, G2DD, RVSP 45.3; c.) TTE 06/02/2022: EF 55-60%, no RWMAs, sev LAE, RVSP 37.9, mild-mod MR, mild AR, AoV sclerosis without stenosis, asc Ao 38 mm   Anxiety    Aortic atherosclerosis    Cardiomyopathy (in setting of Afib)    a.) TTE 12/26/2013: EF 45-50%; b.) TTE 07/04/2019: EF 55%; c.) TTE 06/02/2022: EF 55-60%   Chronic pain syndrome    a.) followed by pain management   Chronic, continuous use of opioids    a.) on COT; followed by pain management   Coronary artery disease 11/06/2014   a.) cCTA 11/06/2014: Ca2+ = 224 (74th %ile; LAD distribution)   Diverticulosis    DJD (degenerative joint disease) of knee    Former smoker    History of hiatal hernia    History of kidney stones 2012   Hyperlipidemia    Hyperplastic colon polyp    Hypertension    Inguinal hernia, left    Internal hemorrhoids    Intervertebral disc disorder with radiculopathy of lumbosacral region    Lesion of bone of lumbosacral  spine    L5   Long term current use of amiodarone     Lower extremity edema    Mixed hyperlipidemia    Morbid obesity (HCC)    Multiple falls    Myalgia due to statin    On rivaroxaban  therapy    Osteoarthritis    PAF (paroxysmal atrial fibrillation) (HCC)    a.) CHA2DS2VASc = 4 (age, HTN, CHF, vascular disease) as of 02/19/23;  b.) s/p DCCV 03/13/14 (200 J), 05/15/19 (150 J x 1, 200 J x2), 05/19/2022 (150 J x1, 200 J x2; pads changed to ant/lat postion with additional 200 J x 2 -> did not convert), 07/25/22 (200 J), 09/21/2022 (200 J); c.) s/p PVI ablation 11/17/2022; d.) rate/rhythm maintained on amiodarone ; chronically anticoagulated on rivaroxaban    Pernicious anemia    Prostate cancer (HCC)    Pulmonary nodule, right    a. 10/2014 Cardiac CTA: 7mm RLL nodule; b. 04/2015 CT Chest: stable 7mm RLL nodule. No new nodules; 02/2017 CTA Chest: stable, benign, 7mm RLL pulm nodule.   Rotator cuff tear, left    Sleep difficulties    a.) takes melatonin + trazodone  PRN   SVT (supraventricular tachycardia)    Tubular adenoma of colon    Past Surgical History:  Procedure Laterality Date   ATRIAL FIBRILLATION ABLATION N/A 11/17/2022   Procedure: ATRIAL FIBRILLATION ABLATION; Location: [facility]; Surgeon: Gehi, Anil, MD   BICEPT TENODESIS Right 09/27/2021   Procedure: Right reverse shoulder arthroplasty, biceps tenodesis;  Surgeon: Tobie Priest, MD;  Location: [facility];  Service: Orthopedics;  Laterality: Right;   CARDIOVERSION N/A 05/15/2019   Procedure: CARDIOVERSION;  Surgeon: Perla Evalene PARAS, MD;  Location: [facility];  Service: Cardiovascular;  Laterality: N/A;   CARDIOVERSION N/A 03/13/2014   Procedure: CARDIOVERSION; Location: [facility]; Surgeon: Evalene Perla, MD   CARDIOVERSION N/A 03/24/2022   Procedure: CARDIOVERSION;  Surgeon: Perla Evalene PARAS, MD;  Location: [facility];  Service: Cardiovascular;  Laterality: N/A;   CARDIOVERSION N/A 05/19/2022   Procedure: CARDIOVERSION;  Surgeon: Perla Evalene PARAS, MD;  Location: [facility];  Service: Cardiovascular;  Laterality: N/A;   CARDIOVERSION N/A 07/25/2022   Procedure: CARDIOVERSION;  Surgeon: Fernande Elspeth BROCKS, MD;  Location: [facility];  Service: Cardiovascular;  Laterality: N/A;   CARDIOVERSION N/A 09/21/2022   Procedure: CARDIOVERSION;  Surgeon: Fernande Elspeth BROCKS, MD;  Location: [facility];  Service: Cardiovascular;  Laterality: N/A;   CARPAL TUNNEL RELEASE Right 12/23/2014   Procedure: CARPAL TUNNEL RELEASE;  Surgeon: Kayla Pinal, MD;  Location: [facility];  Service: Orthopedics;  Laterality: Right;   CARPAL TUNNEL RELEASE Left 01/06/2015   Procedure: CARPAL TUNNEL RELEASE;  Surgeon: Kayla Pinal, MD;  Location: [facility];  Service: Orthopedics;  Laterality: Left;   COLONOSCOPY WITH PROPOFOL  N/A 10/08/2017   Procedure: COLONOSCOPY WITH PROPOFOL ;  Surgeon: Unk Corinn Skiff, MD;  Location: [facility];  Service: Gastroenterology;  Laterality: N/A;   COLONOSCOPY WITH PROPOFOL  N/A 06/07/2022   Procedure: COLONOSCOPY WITH PROPOFOL ;  Surgeon: Unk Corinn Skiff, MD;  Location: [facility];  Service: Gastroenterology;  Laterality: N/A;   ENTEROSCOPY  06/07/2022   Procedure: ENTEROSCOPY;  Surgeon: Unk Corinn Skiff, MD;  Location: [facility];  Service: Gastroenterology;;   ESOPHAGOGASTRODUODENOSCOPY (EGD) WITH PROPOFOL  N/A 06/07/2022   Procedure: ESOPHAGOGASTRODUODENOSCOPY (EGD) WITH PROPOFOL ;  Surgeon: Unk Corinn Skiff, MD;  Location: [facility] ENDOSCOPY;  Service: Gastroenterology;  Laterality: N/A;   GASTRIC BYPASS  01/17/2000   KNEE SURGERY Right    knee trauma x3   prostate seeding     REVERSE SHOULDER ARTHROPLASTY Right 09/27/2021   Procedure: Right reverse shoulder arthroplasty, biceps tenodesis;  Surgeon: Tobie Priest, MD;  Location: [facility];  Service: Orthopedics;  Laterality: Right;   REVERSE SHOULDER ARTHROPLASTY Left 02/20/2023   Procedure: Left reverse shoulder arthroplasty, biceps tenodesis;  Surgeon: Tobie Priest, MD;   Location: [facility];  Service: Orthopedics;  Laterality: Left;   SHOULDER ARTHROSCOPY WITH ROTATOR CUFF REPAIR AND OPEN BICEPS TENODESIS Left 12/27/2021   Procedure: Left shoulder arthroscopic cuff repair (supraspinatus and subscapularis) with Regeneten Patch application;  Surgeon: Tobie Priest, MD;  Location: [facility];  Service: Orthopedics;  Laterality: Left;   Patient Active Problem List   Diagnosis Date Noted   Anticoagulated 09/14/2023   Acute diverticulitis 09/13/2023   Chronic diastolic CHF (congestive heart failure) (HCC) 09/13/2023   Symptomatic anemia 04/26/2022   Chronic pain of both knees 03/30/2022   Bilateral primary osteoarthritis of knee 03/30/2022   Shortness of breath 03/24/2022   Vitamin D  deficiency 02/24/2022   S/p reverse total shoulder arthroplasty 09/27/2021   Lesion of bone of lumbosacral spine (L5) 05/12/2021   Localized osteoarthritis of shoulder regions, bilateral 03/29/2021   Chronic pain of both shoulders 03/29/2021   Drug-induced myopathy 02/21/2021   Trigger finger of right hand  11/15/2020   Prostate cancer (HCC) 07/26/2020   Insomnia 08/01/2019   Primary osteoarthritis of both wrists 02/17/2019   Trigger middle finger of left hand 02/17/2019   Chronic pain syndrome 02/17/2019   Chronic radicular lumbar pain 10/08/2018   Lumbar radiculopathy 10/08/2018   Lumbar degenerative disc disease 10/08/2018   Lumbar facet arthropathy 10/08/2018   Lumbar facet joint syndrome 10/08/2018   Intervertebral disc disorder with radiculopathy of lumbosacral region    Osteoarthritis of knee 11/30/2017   Osteoarthritis of wrist 11/30/2017   Pernicious anemia 05/22/2016   Paroxysmal atrial fibrillation 12/20/2015   Carpal tunnel syndrome 11/16/2014   DJD (degenerative joint disease) of knee 10/27/2014   Bilateral carpal tunnel syndrome 10/27/2014   Morbid obesity with BMI of 45.0-49.9, adult (HCC) 10/27/2014   Mixed hyperlipidemia 10/27/2014   H/O gastric bypass  03/20/2014   Hyperkalemia 03/20/2014   History of prostate cancer 12/26/2013   Essential hypertension 12/26/2013   Encounter for anticoagulation discussion and counseling 12/26/2013   BRBPR (bright red blood per rectum) 12/26/2013   PCP: Dr. Edman  REFERRING PROVIDER: Dr. Earnestine Blanch  REFERRING DIAG: [phone] (ICD-10-CM) - Complete rotator cuff tear or rupture of left shoulder, not specified as traumatic   RATIONALE FOR EVALUATION AND TREATMENT: Rehabilitation  THERAPY DIAG: Chronic left shoulder pain  Muscle weakness (generalized)  ONSET DATE: 02/20/23  FOLLOW-UP APPT SCHEDULED WITH REFERRING PROVIDER: Yes   FROM INITIAL EVALUATION SUBJECTIVE:                                                                                                                                                                                         SUBJECTIVE STATEMENT:  L reverse TSR 02/20/23  PERTINENT HISTORY:  CAINEN BURNHAM is a 68 y.o. male who initially underwent arthroscopic subscapularis repair and side-to-side supraspinatus repair with Regeneten patch application by Dr. Blanch 12/27/2021. He underwent post-surgical physical therapy with initial minimal improvement followed by worsening shoulder strength/function. Repeat MRI showed large, retracted subscapularis re-tear and supraspinatus re-tear with signs of rotator cuff arthropathy. Conservative measures including medications, physical therapy, and cortisone injections did not provide adequate relief. The patient elected to proceed with reverse shoulder arthroplasty and biceps tenodesis on 02/20/23. Intraoperative findings include retracted full-thickness subscapularis tear, partial-thickness supraspinatus tear, and biceps tendinopathy. Surgery was a standard deltopectoral incision under general anesthesia along with an interscalene block. Per op note, operative arm to remain in sling at all times except ROM exercises and hygiene. Can perform pendulums,  elbow/wrist/hand ROM exercises. Passive ROM allowed to 90 FF and 30 ER. ASA 325mg  x 6 weeks for DVT ppx (pt states that he was actually instructed  to start back on his Xarelto  and not to use ASA). Plan for PT starting on POD #3-4. Pt denies any post-operative complications from surgery. Pt has a history of chronic back pain and has undergone multiple lumbar facet, medial branch radiofrequency ablations. He also has a past history of cardiomyopathy (in setting of Afib), DJD (degenerative joint disease) of knee, kidney stones, lower extremity edema, PAF (paroxysmal atrial fibrillation), pernicious anemia, prostate cancer, and a pulmonary nodule. He also has a past surgical history that includes R reverse TSR, knee surgery, gastric bypass (2002), prostate seeding, carpal tunnel release (Right, 12/23/2014; Left, 01/06/2015), and multiple cardioversions.   PAIN:  Pain Intensity: No resting pain upon arrival Pain location: L shoulder Pain Quality: Nerve pain Radiating: No  Numbness/Tingling: No Focal Weakness: Yes 24-hour pain behavior: Varies History of prior shoulder or neck/shoulder injury, pain, surgery, or therapy: Yes PT for L shoulder s/p RTC repair, history of R reverse TSR; Dominant hand: right Red flags Positive: history of prostate CA, Negative: chills/fever, night sweats, nausea, vomiting, unrelenting pain, unexplained weight gain/loss  PRECAUTIONS: Shoulder, per operative note: operative arm to remain in sling at all times except ROM exercises and hygiene. Can perform pendulums, elbow/wrist/hand ROM exercises. Passive ROM allowed to 90 FF and 30 ER.   General reverse TSR precautions include no combined shoulder extension, IR, and adduction. No shoulder and elbow AROM (bicep tenodesis) for at least the first 4 weeks   WEIGHT BEARING RESTRICTIONS: Yes no WB through LUE  FALLS: Has patient fallen in last 6 months? 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  Recent memory is intact.  Remote memory is intact.  Attention span and concentration are intact.  Expressive speech is intact.  Patient's fund of knowledge is within normal limits for educational level.    Gross Musculoskeletal Assessment Tremor: None Bulk: Normal Tone: Normal Incision is clean and dry with minimal blood staining on honeycomb dressing as well as minimal swelling around the incision. Skin is cool to touch and no signs of infection present.   Gait Deferred full gait assessment  Posture Forward head  with upper thoracic kyphosis and rounded shoulders  Cervical Screen Deferred  ROM ROM (Normal range in degrees)    Right (AROM) Left (PROM)  Shoulder    Flexion 160 90 (limited per protocol)  Extension    Abduction 180 90 (scaption)  External Rotation 88 0  Internal Rotation 70 stomach  Hands Behind Head    Hands Behind Back        Elbow    Flexion Full Full  Extension 0 0  Pronation WNL Full palm up  Supination WNL Full palm down  (* = pain; Blank rows = not tested)  UE MMT: MMT (out of 5) Right Left   Shoulder   Flexion 5   Extension    Abduction 5   External rotation 5   Internal rotation 5   Horizontal  abduction    Horizontal adduction    Lower Trapezius    Rhomboids        Elbow  Flexion 5   Extension 5   Pronation 5    Supination 5       Wrist  Flexion 5 5  Extension 5 5  Radial deviation  5  Ulnar deviation  5      MCP  Flexion 5 5  Extension 5 5  Abduction 5 5  Adduction 5 5  (* = pain; Blank rows = not tested)  AROM AROM     Right Left  Shoulder    Flexion 180 160  Extension    Abduction 152 138  External Rotation 90 88  Internal Rotation 70 74  Hands Behind Head C6 C3  Hands Behind Back L1 Lateral waistline  (* = pain; Blank rows = not tested)  07/31/23 UE MMT: MMT (out of 5) Right Left   Shoulder   Flexion 5 4*  Abduction 5 4*  External rotation (seated) 5 4*  Internal rotation (seated) 4+ 4+*  Horizontal abduction Unable to lay prone Unable to lay prone  Horizontal adduction    Lower Trapezius    Rhomboids        Elbow  Flexion 5 5  Extension 5 5  Pronation 5 5  Supination 5 5*   Shoulder Flexion  Peak Force (lb) Right 21.09 lb Left 10.19 lb Strength Difference 10.90 lb* Percentage Difference 69.66%*  Shoulder Abduction Peak Force (lb) Right 14.57 lb Left 8.33 lb Strength Difference 6.24 lb* Percentage Difference 54.48%*  Shoulder External Rotation Peak Force (lb) Right 20.28 lb Left 16.90 lb Strength Difference 3.37 lb* Percentage Difference 18.15%*  Shoulder Internal Rotation Peak Force (lb) Right 19.77 lb Left 20.43 lb Strength Difference 0.66 lb Percentage Difference 3.28%  10/11/23: AROM AROM     Right Left  Shoulder    Flexion  156  Extension    Abduction/Scaption  84*  External Rotation  50*  Internal Rotation  54*  Hands Behind Head    Hands Behind Back    (* = pain; Blank rows = not tested)  UE MMT: MMT (out of 5) Right Left   Shoulder   Flexion  4*  Abduction  4*  External rotation (seated)  4*  Internal rotation (seated)  4+*  Horizontal abduction  Unable to lay prone  Horizontal adduction    Lower Trapezius    Rhomboids        Elbow  Flexion  5  Extension  5  Pronation  5  Supination  5     TODAY'S  TREATMENT: 10/25/2023    Subjective:  Pt states that his labwork didn't show any sign of infection. He saw a massage therapist and states that it helped considerably. He complains of ongoing L shoulder pain and loss of ROM but has noticed improvement since the massage. No specific questions currently.   PAIN: Burning/ache    Ther-ex  In semi-recumbent position: L shoulder flexion with 2# dumbbell 2 x 10; L shoulder scaption, unweighted, 2 x 10; L shoulder overhead press with 2# DB x 10; L shoulder manually resisted ER 2 x 10; L shoulder manually resisted IR 2 x 10; L elbow curl with with manual resistance 2 x 10; L elbow manually resisted extension 2 x 10; Dowel chest press with manual resistance from therapist 2 x 10; Dowel rows with manual resistance 2 x 10;  Seated dowel chest press with  manual resistance from therapist 2 x 10; Seated shoulder rolls 2 x 10; Seated scapular retractions 2 x 10; Seated horizontal abduction with blue tband 2 x 10; Seated L shoulder extension with blue tband 2 x 10;   Manual Therapy  Extensive STM to L upper/mid/low trap, cervical paraspinals, and rhomboids in sitting;   Not performed: Nautilus lat pull downs with hand grips 95# 3 x 10; Nautilus rows with hand grips 50# 3 x 10; Seated pulleys for L shoulder AAROM overhead flexion and abduction x multiple bouts each direction; L shoulder isometric abduction and extension 2 x 10 each; Seated W's with green tband 2 x 10;   HOME EXERCISE PROGRAM:  Access Code: [email or web address] URL: [email or web address] Date: 07/31/2023 Prepared by: Selinda Eck  Exercises - Seated Cervical Sidebending Stretch (Mirrored)  - 2 x daily - 7 x weekly - 3 reps - 30s hold - Seated Scapular Retraction  - 2 x daily - 7 x weekly - 2 sets - 10 reps - 3s hold - Standing Shoulder Flexion Full Range Single Arm  - 1 x daily - 7 x weekly - 2 sets - 10 reps - 3s hold - Standing Shoulder Abduction Full Range Single  Arm  - 1 x daily - 7 x weekly - 2 sets - 10 reps - 3s hold - Wrist Flexion AROM  - 2 x daily - 7 x weekly - 2 sets - 10 reps - 3s hold - Wrist Extension AROM  - 1 x daily - 7 x weekly - 2 sets - 10 reps - 3s hold - Shoulder External Rotation and Scapular Retraction with Resistance  - 1 x daily - 7 x weekly - 2 sets - 10 reps - 3s hold    ASSESSMENT: Updated outcome measures/goals with patient during 10/11/23 visit. At that time his L shoulder AROM was worse compared to when it was last updated prior to stopping therapy due to insurance denial. His MMT was still painful and he had to regress from a 4# DB prior to ending therapy to a 2# DB. He still has not been able to return back to the weight he was using prior to the break. Exercises remains painful and his muscles fatigue quicker. His QuickDASH only worsened slightly since it was last updated and was 59.1% on 10/11/23. Fortunately since his L shoulder injection his worst pain decreased from 10/10 to 8/10. He is very limited with the functional use of his LUE. Continued strengthening with patient during session today. Notable improvement in pain and ROM s/p massage therapy appointment. Also performed extensive STM to L upper quarter today due to benefit from massage therapy session this week. Mr. Lograsso would benefit from ongoing PT to prevent additional decline, regain L shoulder strength, and decrease pain. Pt encouraged to continue HEP and follow up as directed. He will continue to benefit from skilled therapy to address remaining deficits in L shoulder weakness and pain order to improve quality of life and return to PLOF.     OBJECTIVE IMPAIRMENTS: decreased ROM, decreased strength, and pain.   ACTIVITY LIMITATIONS: carrying, lifting, bathing, toileting, dressing, and reach over head  PARTICIPATION LIMITATIONS: meal prep, cleaning, laundry, driving, shopping, community activity, and yard work  PERSONAL FACTORS: Past/current experiences, Time since  onset of injury/illness/exacerbation, and 3+ comorbidities: OA, anemia, anxiety, cardiomyopathy, chronic pain are also affecting patient's functional outcome.   REHAB POTENTIAL: Good  CLINICAL DECISION MAKING: Unstable/unpredictable  EVALUATION COMPLEXITY: High   GOALS: Goals reviewed with patient?  Yes  SHORT TERM GOALS: Target date: 07/09/2023   Pt will be independent with HEP to improve strength and decrease shoulder pain to improve pain-free function at home and work. Baseline: Issued and progressing Goal status: ONGOING   LONG TERM GOALS: Target date: 08/20/2023  1.  Pt will report no further L shoulder pain with all AROM in order to complete all ADLs and improve pain-free function. Baseline: 05/28/23: worst pain 7/10; 07/31/23: 10/10; 10/12/23: 8/10; Goal status: ONGOING  2.  Pt will decrease QuickDASH score to below 30% in order to demonstrate clinically significant reduction in disability related to shoulder pain        Baseline: 02/26/23: 72.7%; 05/28/23: 40.9%; 07/31/23: 56.8%; 10/11/23: 59.1% Goal status: PARTIALLY MET  3. Pt will increase strength of pain-free L shoulder flexion and scaption to at least 4/5 in order to demonstrate improvement in strength and function         Baseline: 02/26/23: Unable to test; 05/28/23: flexion: 4+/5, pain free, scaption: 4/5, pain; 07/31/23: see note above, painful; 10/11/23:  flexion: 4/5 with pain, scaption: 4/5 with pain; Goal status: PARTIALLY MET  4. Pt will improve L shoulder AROM flexion and scaption to at least 110 degrees in order to demonstrate improvement in function so he can take care of his property and complete all household responsibilities;        Baseline: 02/26/23: Unable to test (90 degrees PROM for both), 05/28/23: L shoulder AROM against gravity: flexion: 176, no pain, scaption: 130, pain/burning; 07/31/23: see note; 10/12/23: flexion: 156 with pain, scaption: 84 with pain Goal status: PARTIALLY MET   PLAN: PT FREQUENCY:  1-2x/week  PT DURATION: 12 weeks  PLANNED INTERVENTIONS: Therapeutic exercises, Therapeutic activity, Neuromuscular re-education, Balance training, Gait training, Patient/Family education, Self Care, Joint mobilization, Joint manipulation, Vestibular training, Canalith repositioning, Orthotic/Fit training, DME instructions, Dry Needling, Electrical stimulation, Spinal manipulation, Spinal mobilization, Cryotherapy, Moist heat, Taping, Traction, Ultrasound, Ionotophoresis 4mg /ml Dexamethasone , Manual therapy, and Re-evaluation.  PLAN FOR NEXT SESSION: progress HEP, progress L shoulder ROM and strength per surgical protocol;  Selinda BIRCH Lerline Valdivia PT, DPT, GCS  8:48 AM,10/26/23

## 2023-10-26 NOTE — Therapy (Signed)
 OUTPATIENT PHYSICAL THERAPY SHOULDER TREATMENT  Patient Name: [name] MRN: [id number] DOB:1955/11/06, 68 y.o., male Today's Date: 10/29/2023   PT End of Session - 10/29/23 1232     Visit Number 21    Number of Visits 49    Date for Recertification  01/03/24    Authorization Type eval: 02/26/23, [facility] Appeal Approved [id number] visits between 09/03/23-12/21/23  [facility] Medicare 2025  CO:Ajdzi on AUTH  Copay: $20  OOP:$3900/$0 used  [name] is req  Ref #:[id number]    Authorization - Visit Number 5    Authorization - Number of Visits 12    PT Start Time 1105    PT Stop Time 1130    PT Time Calculation (min) 25 min    Activity Tolerance Patient tolerated treatment well    Behavior During Therapy WFL for tasks assessed/performed         Past Medical History:  Diagnosis Date   (HFpEF) heart failure with preserved ejection fraction (HCC)    a.) TTE 12/26/2013: EF 45-50%, mild ant and antsept HK. mild MR. Mod dil LA. nl RV fxn. Rhythm was Afib; b.) TTE 07/04/2019: EF 55%, mid-apical anteroseptal HK, mild MAC, mild Ao sclerosis, G2DD, RVSP 45.3; c.) TTE 06/02/2022: EF 55-60%, no RWMAs, sev LAE, RVSP 37.9, mild-mod MR, mild AR, AoV sclerosis without stenosis, asc Ao 38 mm   Anxiety    Aortic atherosclerosis    Cardiomyopathy (in setting of Afib)    a.) TTE 12/26/2013: EF 45-50%; b.) TTE 07/04/2019: EF 55%; c.) TTE 06/02/2022: EF 55-60%   Chronic pain syndrome    a.) followed by pain management   Chronic, continuous use of opioids    a.) on COT; followed by pain management   Coronary artery disease 11/06/2014   a.) cCTA 11/06/2014: Ca2+ = 224 (74th %ile; LAD distribution)   Diverticulosis    DJD (degenerative joint disease) of knee    Former smoker    History of hiatal hernia    History of kidney stones 2012   Hyperlipidemia    Hyperplastic colon polyp    Hypertension    Inguinal hernia, left    Internal hemorrhoids    Intervertebral disc disorder with radiculopathy of lumbosacral  region    Lesion of bone of lumbosacral spine    L5   Long term current use of amiodarone     Lower extremity edema    Mixed hyperlipidemia    Morbid obesity (HCC)    Multiple falls    Myalgia due to statin    On rivaroxaban  therapy    Osteoarthritis    PAF (paroxysmal atrial fibrillation) (HCC)    a.) CHA2DS2VASc = 4 (age, HTN, CHF, vascular disease) as of 02/19/23;  b.) s/p DCCV 03/13/14 (200 J), 05/15/19 (150 J x 1, 200 J x2), 05/19/2022 (150 J x1, 200 J x2; pads changed to ant/lat postion with additional 200 J x 2 -> did not convert), 07/25/22 (200 J), 09/21/2022 (200 J); c.) s/p PVI ablation 11/17/2022; d.) rate/rhythm maintained on amiodarone ; chronically anticoagulated on rivaroxaban    Pernicious anemia    Prostate cancer (HCC)    Pulmonary nodule, right    a. 10/2014 Cardiac CTA: 7mm RLL nodule; b. 04/2015 CT Chest: stable 7mm RLL nodule. No new nodules; 02/2017 CTA Chest: stable, benign, 7mm RLL pulm nodule.   Rotator cuff tear, left    Sleep difficulties    a.) takes melatonin + trazodone  PRN   SVT (supraventricular tachycardia)    Tubular adenoma of  colon    Past Surgical History:  Procedure Laterality Date   ATRIAL FIBRILLATION ABLATION N/A 11/17/2022   Procedure: ATRIAL FIBRILLATION ABLATION; Location: [facility]; Surgeon: Gehi, Anil, MD   BICEPT TENODESIS Right 09/27/2021   Procedure: Right reverse shoulder arthroplasty, biceps tenodesis;  Surgeon: Tobie Priest, MD;  Location: [facility];  Service: Orthopedics;  Laterality: Right;   CARDIOVERSION N/A 05/15/2019   Procedure: CARDIOVERSION;  Surgeon: Perla Evalene PARAS, MD;  Location: [facility];  Service: Cardiovascular;  Laterality: N/A;   CARDIOVERSION N/A 03/13/2014   Procedure: CARDIOVERSION; Location: [facility]; Surgeon: Evalene Perla, MD   CARDIOVERSION N/A 03/24/2022   Procedure: CARDIOVERSION;  Surgeon: Perla Evalene PARAS, MD;  Location: [facility];  Service: Cardiovascular;  Laterality: N/A;   CARDIOVERSION N/A 05/19/2022    Procedure: CARDIOVERSION;  Surgeon: Perla Evalene PARAS, MD;  Location: [facility];  Service: Cardiovascular;  Laterality: N/A;   CARDIOVERSION N/A 07/25/2022   Procedure: CARDIOVERSION;  Surgeon: Fernande Elspeth BROCKS, MD;  Location: [facility];  Service: Cardiovascular;  Laterality: N/A;   CARDIOVERSION N/A 09/21/2022   Procedure: CARDIOVERSION;  Surgeon: Fernande Elspeth BROCKS, MD;  Location: [facility];  Service: Cardiovascular;  Laterality: N/A;   CARPAL TUNNEL RELEASE Right 12/23/2014   Procedure: CARPAL TUNNEL RELEASE;  Surgeon: Kayla Pinal, MD;  Location: [facility];  Service: Orthopedics;  Laterality: Right;   CARPAL TUNNEL RELEASE Left 01/06/2015   Procedure: CARPAL TUNNEL RELEASE;  Surgeon: Kayla Pinal, MD;  Location: [facility];  Service: Orthopedics;  Laterality: Left;   COLONOSCOPY WITH PROPOFOL  N/A 10/08/2017   Procedure: COLONOSCOPY WITH PROPOFOL ;  Surgeon: Unk Corinn Skiff, MD;  Location: [facility];  Service: Gastroenterology;  Laterality: N/A;   COLONOSCOPY WITH PROPOFOL  N/A 06/07/2022   Procedure: COLONOSCOPY WITH PROPOFOL ;  Surgeon: Unk Corinn Skiff, MD;  Location: [facility];  Service: Gastroenterology;  Laterality: N/A;   ENTEROSCOPY  06/07/2022   Procedure: ENTEROSCOPY;  Surgeon: Unk Corinn Skiff, MD;  Location: [facility] ENDOSCOPY;  Service: Gastroenterology;;   ESOPHAGOGASTRODUODENOSCOPY (EGD) WITH PROPOFOL  N/A 06/07/2022   Procedure: ESOPHAGOGASTRODUODENOSCOPY (EGD) WITH PROPOFOL ;  Surgeon: Unk Corinn Skiff, MD;  Location: [facility];  Service: Gastroenterology;  Laterality: N/A;   GASTRIC BYPASS  01/17/2000   KNEE SURGERY Right    knee trauma x3   prostate seeding     REVERSE SHOULDER ARTHROPLASTY Right 09/27/2021   Procedure: Right reverse shoulder arthroplasty, biceps tenodesis;  Surgeon: Tobie Priest, MD;  Location: [facility];  Service: Orthopedics;  Laterality: Right;   REVERSE SHOULDER ARTHROPLASTY Left 02/20/2023   Procedure: Left reverse shoulder arthroplasty,  biceps tenodesis;  Surgeon: Tobie Priest, MD;  Location: [facility];  Service: Orthopedics;  Laterality: Left;   SHOULDER ARTHROSCOPY WITH ROTATOR CUFF REPAIR AND OPEN BICEPS TENODESIS Left 12/27/2021   Procedure: Left shoulder arthroscopic cuff repair (supraspinatus and subscapularis) with Regeneten Patch application;  Surgeon: Tobie Priest, MD;  Location: [facility];  Service: Orthopedics;  Laterality: Left;   Patient Active Problem List   Diagnosis Date Noted   Anticoagulated 09/14/2023   Acute diverticulitis 09/13/2023   Chronic diastolic CHF (congestive heart failure) (HCC) 09/13/2023   Symptomatic anemia 04/26/2022   Chronic pain of both knees 03/30/2022   Bilateral primary osteoarthritis of knee 03/30/2022   Shortness of breath 03/24/2022   Vitamin D  deficiency 02/24/2022   S/p reverse total shoulder arthroplasty 09/27/2021   Lesion of bone of lumbosacral spine (L5) 05/12/2021   Localized osteoarthritis of shoulder regions, bilateral 03/29/2021   Chronic pain of both shoulders 03/29/2021   Drug-induced myopathy  02/21/2021   Trigger finger of right hand 11/15/2020   Prostate cancer (HCC) 07/26/2020   Insomnia 08/01/2019   Primary osteoarthritis of both wrists 02/17/2019   Trigger middle finger of left hand 02/17/2019   Chronic pain syndrome 02/17/2019   Chronic radicular lumbar pain 10/08/2018   Lumbar radiculopathy 10/08/2018   Lumbar degenerative disc disease 10/08/2018   Lumbar facet arthropathy 10/08/2018   Lumbar facet joint syndrome 10/08/2018   Intervertebral disc disorder with radiculopathy of lumbosacral region    Osteoarthritis of knee 11/30/2017   Osteoarthritis of wrist 11/30/2017   Pernicious anemia 05/22/2016   Paroxysmal atrial fibrillation 12/20/2015   Carpal tunnel syndrome 11/16/2014   DJD (degenerative joint disease) of knee 10/27/2014   Bilateral carpal tunnel syndrome 10/27/2014   Morbid obesity with BMI of 45.0-49.9, adult (HCC) 10/27/2014   Mixed  hyperlipidemia 10/27/2014   H/O gastric bypass 03/20/2014   Hyperkalemia 03/20/2014   History of prostate cancer 12/26/2013   Essential hypertension 12/26/2013   Encounter for anticoagulation discussion and counseling 12/26/2013   BRBPR (bright red blood per rectum) 12/26/2013   PCP: Dr. Edman  REFERRING PROVIDER: Dr. Earnestine Blanch  REFERRING DIAG: [id number] (ICD-10-CM) - Complete rotator cuff tear or rupture of left shoulder, not specified as traumatic   RATIONALE FOR EVALUATION AND TREATMENT: Rehabilitation  THERAPY DIAG: Chronic left shoulder pain  Muscle weakness (generalized)  ONSET DATE: 02/20/23  FOLLOW-UP APPT SCHEDULED WITH REFERRING PROVIDER: Yes   FROM INITIAL EVALUATION SUBJECTIVE:                                                                                                                                                                                         SUBJECTIVE STATEMENT:  L reverse TSR 02/20/23  PERTINENT HISTORY:  KAIRI TUFO is a 68 y.o. male who initially underwent arthroscopic subscapularis repair and side-to-side supraspinatus repair with Regeneten patch application by Dr. Blanch 12/27/2021. He underwent post-surgical physical therapy with initial minimal improvement followed by worsening shoulder strength/function. Repeat MRI showed large, retracted subscapularis re-tear and supraspinatus re-tear with signs of rotator cuff arthropathy. Conservative measures including medications, physical therapy, and cortisone injections did not provide adequate relief. The patient elected to proceed with reverse shoulder arthroplasty and biceps tenodesis on 02/20/23. Intraoperative findings include retracted full-thickness subscapularis tear, partial-thickness supraspinatus tear, and biceps tendinopathy. Surgery was a standard deltopectoral incision under general anesthesia along with an interscalene block. Per op note, operative arm to remain in sling at all times except  ROM exercises and hygiene. Can perform pendulums, elbow/wrist/hand ROM exercises. Passive ROM allowed to 90 FF and 30 ER. ASA 325mg  x 6 weeks for DVT  ppx (pt states that he was actually instructed to start back on his Xarelto  and not to use ASA). Plan for PT starting on POD #3-4. Pt denies any post-operative complications from surgery. Pt has a history of chronic back pain and has undergone multiple lumbar facet, medial branch radiofrequency ablations. He also has a past history of cardiomyopathy (in setting of Afib), DJD (degenerative joint disease) of knee, kidney stones, lower extremity edema, PAF (paroxysmal atrial fibrillation), pernicious anemia, prostate cancer, and a pulmonary nodule. He also has a past surgical history that includes R reverse TSR, knee surgery, gastric bypass (2002), prostate seeding, carpal tunnel release (Right, 12/23/2014; Left, 01/06/2015), and multiple cardioversions.   PAIN:  Pain Intensity: No resting pain upon arrival Pain location: L shoulder Pain Quality: Nerve pain Radiating: No  Numbness/Tingling: No Focal Weakness: Yes 24-hour pain behavior: Varies History of prior shoulder or neck/shoulder injury, pain, surgery, or therapy: Yes PT for L shoulder s/p RTC repair, history of R reverse TSR; Dominant hand: right Red flags Positive: history of prostate CA, Negative: chills/fever, night sweats, nausea, vomiting, unrelenting pain, unexplained weight gain/loss  PRECAUTIONS: Shoulder, per operative note: operative arm to remain in sling at all times except ROM exercises and hygiene. Can perform pendulums, elbow/wrist/hand ROM exercises. Passive ROM allowed to 90 FF and 30 ER.   General reverse TSR precautions include no combined shoulder extension, IR, and adduction. No shoulder and elbow AROM (bicep tenodesis) for at least the first 4 weeks   WEIGHT BEARING RESTRICTIONS: Yes no WB through LUE  FALLS: Has patient fallen in last 6 months? 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  Recent memory is intact.  Remote memory is intact.  Attention span and concentration are intact.  Expressive speech is intact.  Patient's fund of knowledge is within normal limits for educational level.    Gross Musculoskeletal Assessment Tremor: None Bulk: Normal Tone: Normal Incision is clean and dry with minimal blood staining on honeycomb dressing as well as minimal swelling around the incision. Skin is cool to touch and no signs of infection present.   Gait Deferred full gait assessment  Posture Forward head  with upper thoracic kyphosis and rounded shoulders  Cervical Screen Deferred  ROM ROM (Normal range in degrees)    Right (AROM) Left (PROM)  Shoulder    Flexion 160 90 (limited per protocol)  Extension    Abduction 180 90 (scaption)  External Rotation 88 0  Internal Rotation 70 stomach  Hands Behind Head    Hands Behind Back        Elbow    Flexion Full Full  Extension 0 0  Pronation WNL Full palm up  Supination WNL Full palm down  (* = pain; Blank rows = not tested)  UE MMT: MMT (out of 5) Right Left   Shoulder   Flexion 5   Extension    Abduction 5   External rotation 5  Internal rotation 5   Horizontal abduction    Horizontal adduction    Lower Trapezius    Rhomboids         Elbow  Flexion 5   Extension 5   Pronation 5   Supination 5       Wrist  Flexion 5 5  Extension 5 5  Radial deviation  5  Ulnar deviation  5      MCP  Flexion 5 5  Extension 5 5  Abduction 5 5  Adduction 5 5  (* = pain; Blank rows = not tested)  AROM AROM     Right Left  Shoulder    Flexion 180 160  Extension    Abduction 152 138  External Rotation 90 88  Internal Rotation 70 74  Hands Behind Head C6 C3  Hands Behind Back L1 Lateral waistline  (* = pain; Blank rows = not tested)  07/31/23 UE MMT: MMT (out of 5) Right Left   Shoulder   Flexion 5 4*  Abduction 5 4*  External rotation (seated) 5 4*  Internal rotation (seated) 4+ 4+*  Horizontal abduction Unable to lay prone Unable to lay prone  Horizontal adduction    Lower Trapezius    Rhomboids        Elbow  Flexion 5 5  Extension 5 5  Pronation 5 5  Supination 5 5*   Shoulder Flexion  Peak Force (lb) Right 21.09 lb Left 10.19 lb Strength Difference 10.90 lb* Percentage Difference 69.66%*  Shoulder Abduction Peak Force (lb) Right 14.57 lb Left 8.33 lb Strength Difference 6.24 lb* Percentage Difference 54.48%*  Shoulder External Rotation Peak Force (lb) Right 20.28 lb Left 16.90 lb Strength Difference 3.37 lb* Percentage Difference 18.15%*  Shoulder Internal Rotation Peak Force (lb) Right 19.77 lb Left 20.43 lb Strength Difference 0.66 lb Percentage Difference 3.28%  10/11/23: AROM AROM     Right Left  Shoulder    Flexion  156  Extension    Abduction/Scaption  84*  External Rotation  50*  Internal Rotation  54*  Hands Behind Head    Hands Behind Back    (* = pain; Blank rows = not tested)  UE MMT: MMT (out of 5) Right Left   Shoulder   Flexion  4*  Abduction  4*  External rotation (seated)  4*  Internal rotation (seated)  4+*  Horizontal abduction  Unable to lay prone  Horizontal adduction    Lower Trapezius    Rhomboids        Elbow  Flexion  5  Extension  5   Pronation  5  Supination  5     TODAY'S TREATMENT: 10/29/2023    Subjective:  Pt states that he hasn't felt great this morning. No resting L shoulder pain upon arrival. No specific questions currently.   PAIN: Burning/ache    Ther-ex  In semi-recumbent position: L shoulder flexion with 3# dumbbell 3 x 10; L shoulder scaption, unweighted, 3 x 10; L shoulder overhead press with 2# DB 2 x 10; L shoulder manually resisted ER 3 x 10; L shoulder manually resisted IR 3 x 10; L elbow curl with with manual resistance 2 x 10; L elbow manually resisted extension 2 x 10; Dowel chest press with manual resistance from therapist 2 x 10; Dowel rows with manual resistance 2 x 10;   Not performed: Nautilus lat pull downs with hand grips 95# 3 x 10; Nautilus rows with hand grips 50# 3 x  10; Seated pulleys for L shoulder AAROM overhead flexion and abduction x multiple bouts each direction; L shoulder isometric abduction and extension 2 x 10 each; Seated W's with green tband 2 x 10; Seated dowel chest press with manual resistance from therapist 2 x 10; Seated shoulder rolls 2 x 10; Seated scapular retractions 2 x 10; Seated horizontal abduction with blue tband 2 x 10; Seated L shoulder extension with blue tband 2 x 10;   HOME EXERCISE PROGRAM:  Access Code: [email or web address] URL: [email or web address] Date: 07/31/2023 Prepared by: Selinda Eck  Exercises - Seated Cervical Sidebending Stretch (Mirrored)  - 2 x daily - 7 x weekly - 3 reps - 30s hold - Seated Scapular Retraction  - 2 x daily - 7 x weekly - 2 sets - 10 reps - 3s hold - Standing Shoulder Flexion Full Range Single Arm  - 1 x daily - 7 x weekly - 2 sets - 10 reps - 3s hold - Standing Shoulder Abduction Full Range Single Arm  - 1 x daily - 7 x weekly - 2 sets - 10 reps - 3s hold - Wrist Flexion AROM  - 2 x daily - 7 x weekly - 2 sets - 10 reps - 3s hold - Wrist Extension AROM  - 1 x daily - 7 x weekly - 2 sets - 10  reps - 3s hold - Shoulder External Rotation and Scapular Retraction with Resistance  - 1 x daily - 7 x weekly - 2 sets - 10 reps - 3s hold    ASSESSMENT: Continued strengthening with patient during session today. He is able to increase dumbells to 3# and perform a third set of most exercises. Pt does report feeling generally unwell today but states that his vitals have been stable today when checking them at home. During session he starts to sweat considerably but denies any SOB or chest pain. Upon palpation his pulse feels irregularly irregular and he has a history of frequent afib. Pt elects to end session early in order to use his home EKG machine to check his heart rhythm. He messaged back later reporting that by the time he got to the car his sweating stopped and he began to feel better. Mr. Currier would benefit from ongoing PT to prevent additional decline, regain L shoulder strength, and decrease pain. Pt encouraged to continue HEP and follow up as directed. He will continue to benefit from skilled therapy to address remaining deficits in L shoulder weakness and pain order to improve quality of life and return to PLOF.     OBJECTIVE IMPAIRMENTS: decreased ROM, decreased strength, and pain.   ACTIVITY LIMITATIONS: carrying, lifting, bathing, toileting, dressing, and reach over head  PARTICIPATION LIMITATIONS: meal prep, cleaning, laundry, driving, shopping, community activity, and yard work  PERSONAL FACTORS: Past/current experiences, Time since onset of injury/illness/exacerbation, and 3+ comorbidities: OA, anemia, anxiety, cardiomyopathy, chronic pain are also affecting patient's functional outcome.   REHAB POTENTIAL: Good  CLINICAL DECISION MAKING: Unstable/unpredictable  EVALUATION COMPLEXITY: High   GOALS: Goals reviewed with patient? Yes  SHORT TERM GOALS: Target date: 07/09/2023   Pt will be independent with HEP to improve strength and decrease shoulder pain to improve  pain-free function at home and work. Baseline: Issued and progressing Goal status: ONGOING   LONG TERM GOALS: Target date: 08/20/2023  1.  Pt will report no further L shoulder pain with all AROM in order to complete all ADLs and improve pain-free function. Baseline: 05/28/23: worst pain 7/10; 07/31/23:  10/10; 10/12/23: 8/10; Goal status: ONGOING  2.  Pt will decrease QuickDASH score to below 30% in order to demonstrate clinically significant reduction in disability related to shoulder pain        Baseline: 02/26/23: 72.7%; 05/28/23: 40.9%; 07/31/23: 56.8%; 10/11/23: 59.1% Goal status: PARTIALLY MET  3. Pt will increase strength of pain-free L shoulder flexion and scaption to at least 4/5 in order to demonstrate improvement in strength and function         Baseline: 02/26/23: Unable to test; 05/28/23: flexion: 4+/5, pain free, scaption: 4/5, pain; 07/31/23: see note above, painful; 10/11/23:  flexion: 4/5 with pain, scaption: 4/5 with pain; Goal status: PARTIALLY MET  4. Pt will improve L shoulder AROM flexion and scaption to at least 110 degrees in order to demonstrate improvement in function so he can take care of his property and complete all household responsibilities;        Baseline: 02/26/23: Unable to test (90 degrees PROM for both), 05/28/23: L shoulder AROM against gravity: flexion: 176, no pain, scaption: 130, pain/burning; 07/31/23: see note; 10/12/23: flexion: 156 with pain, scaption: 84 with pain Goal status: PARTIALLY MET   PLAN: PT FREQUENCY: 1-2x/week  PT DURATION: 12 weeks  PLANNED INTERVENTIONS: Therapeutic exercises, Therapeutic activity, Neuromuscular re-education, Balance training, Gait training, Patient/Family education, Self Care, Joint mobilization, Joint manipulation, Vestibular training, Canalith repositioning, Orthotic/Fit training, DME instructions, Dry Needling, Electrical stimulation, Spinal manipulation, Spinal mobilization, Cryotherapy, Moist heat, Taping, Traction,  Ultrasound, Ionotophoresis 4mg /ml Dexamethasone , Manual therapy, and Re-evaluation.  PLAN FOR NEXT SESSION: progress HEP, progress L shoulder ROM and strength per surgical protocol;  Selinda BIRCH Colen Eltzroth PT, DPT, GCS  12:44 PM,10/29/23

## 2023-10-29 ENCOUNTER — Ambulatory Visit

## 2023-10-29 ENCOUNTER — Telehealth: Payer: Self-pay | Admitting: Student in an Organized Health Care Education/Training Program

## 2023-10-29 DIAGNOSIS — M7522 Bicipital tendinitis, left shoulder: Secondary | ICD-10-CM | POA: Diagnosis not present

## 2023-10-29 DIAGNOSIS — M7592 Shoulder lesion, unspecified, left shoulder: Secondary | ICD-10-CM | POA: Diagnosis not present

## 2023-10-29 DIAGNOSIS — G8929 Other chronic pain: Secondary | ICD-10-CM

## 2023-10-29 DIAGNOSIS — M6281 Muscle weakness (generalized): Secondary | ICD-10-CM

## 2023-10-29 DIAGNOSIS — M25512 Pain in left shoulder: Secondary | ICD-10-CM | POA: Diagnosis not present

## 2023-10-29 NOTE — Telephone Encounter (Signed)
 Called patient to offer appointment with Seema, patient states that he has appointment with Dr, Patterson on 11/07/23 and he will just keep that appointment. Informed patient to let us  know if he changed his mind. Otherwise, we would let him know if something sooner became available.

## 2023-10-29 NOTE — Therapy (Signed)
 OUTPATIENT PHYSICAL THERAPY SHOULDER TREATMENT  Patient Name: [name] MRN: [id number] DOB:[birth year], 68 y.o., male Today's Date: 11/02/2023   PT End of Session - 11/01/23 1535     Visit Number 22    Number of Visits 49    Date for Recertification  01/03/24    Authorization Type eval: 02/26/23, [facility] Appeal Approved [id number] visits between 09/03/23-12/21/23  [facility] Medicare 2025  CO:Ajdzi on AUTH  Copay: $20  OOP:$3900/$0 used  [name] is req  Ref #:[id number]    Authorization - Visit Number 6    Authorization - Number of Visits 12    PT Start Time 1535    PT Stop Time 1615    PT Time Calculation (min) 40 min    Activity Tolerance Patient tolerated treatment well    Behavior During Therapy WFL for tasks assessed/performed          Past Medical History:  Diagnosis Date   (HFpEF) heart failure with preserved ejection fraction (HCC)    a.) TTE 12/26/2013: EF 45-50%, mild ant and antsept HK. mild MR. Mod dil LA. nl RV fxn. Rhythm was Afib; b.) TTE 07/04/2019: EF 55%, mid-apical anteroseptal HK, mild MAC, mild Ao sclerosis, G2DD, RVSP 45.3; c.) TTE 06/02/2022: EF 55-60%, no RWMAs, sev LAE, RVSP 37.9, mild-mod MR, mild AR, AoV sclerosis without stenosis, asc Ao 38 mm   Anxiety    Aortic atherosclerosis    Cardiomyopathy (in setting of Afib)    a.) TTE 12/26/2013: EF 45-50%; b.) TTE 07/04/2019: EF 55%; c.) TTE 06/02/2022: EF 55-60%   Chronic pain syndrome    a.) followed by pain management   Chronic, continuous use of opioids    a.) on COT; followed by pain management   Coronary artery disease 11/06/2014   a.) cCTA 11/06/2014: Ca2+ = 224 (74th %ile; LAD distribution)   Diverticulosis    DJD (degenerative joint disease) of knee    Former smoker    History of hiatal hernia    History of kidney stones 2012   Hyperlipidemia    Hyperplastic colon polyp    Hypertension    Inguinal hernia, left    Internal hemorrhoids    Intervertebral disc disorder with radiculopathy of lumbosacral  region    Lesion of bone of lumbosacral spine    L5   Long term current use of amiodarone     Lower extremity edema    Mixed hyperlipidemia    Morbid obesity (HCC)    Multiple falls    Myalgia due to statin    On rivaroxaban  therapy    Osteoarthritis    PAF (paroxysmal atrial fibrillation) (HCC)    a.) CHA2DS2VASc = 4 (age, HTN, CHF, vascular disease) as of 02/19/23;  b.) s/p DCCV 03/13/14 (200 J), 05/15/19 (150 J x 1, 200 J x2), 05/19/2022 (150 J x1, 200 J x2; pads changed to ant/lat postion with additional 200 J x 2 -> did not convert), 07/25/22 (200 J), 09/21/2022 (200 J); c.) s/p PVI ablation 11/17/2022; d.) rate/rhythm maintained on amiodarone ; chronically anticoagulated on rivaroxaban    Pernicious anemia    Prostate cancer (HCC)    Pulmonary nodule, right    a. 10/2014 Cardiac CTA: 7mm RLL nodule; b. 04/2015 CT Chest: stable 7mm RLL nodule. No new nodules; 02/2017 CTA Chest: stable, benign, 7mm RLL pulm nodule.   Rotator cuff tear, left    Sleep difficulties    a.) takes melatonin + trazodone  PRN   SVT (supraventricular tachycardia)    Tubular adenoma  of colon    Past Surgical History:  Procedure Laterality Date   ATRIAL FIBRILLATION ABLATION N/A 11/17/2022   Procedure: ATRIAL FIBRILLATION ABLATION; Location: [facility]; Surgeon: Gehi, Anil, MD   BICEPT TENODESIS Right 09/27/2021   Procedure: Right reverse shoulder arthroplasty, biceps tenodesis;  Surgeon: Tobie Priest, MD;  Location: [facility];  Service: Orthopedics;  Laterality: Right;   CARDIOVERSION N/A 05/15/2019   Procedure: CARDIOVERSION;  Surgeon: Perla Evalene PARAS, MD;  Location: [facility];  Service: Cardiovascular;  Laterality: N/A;   CARDIOVERSION N/A 03/13/2014   Procedure: CARDIOVERSION; Location: [facility]; Surgeon: Evalene Perla, MD   CARDIOVERSION N/A 03/24/2022   Procedure: CARDIOVERSION;  Surgeon: Perla Evalene PARAS, MD;  Location: [facility];  Service: Cardiovascular;  Laterality: N/A;   CARDIOVERSION N/A 05/19/2022    Procedure: CARDIOVERSION;  Surgeon: Perla Evalene PARAS, MD;  Location: [facility];  Service: Cardiovascular;  Laterality: N/A;   CARDIOVERSION N/A 07/25/2022   Procedure: CARDIOVERSION;  Surgeon: Fernande Elspeth BROCKS, MD;  Location: [facility];  Service: Cardiovascular;  Laterality: N/A;   CARDIOVERSION N/A 09/21/2022   Procedure: CARDIOVERSION;  Surgeon: Fernande Elspeth BROCKS, MD;  Location: [facility];  Service: Cardiovascular;  Laterality: N/A;   CARPAL TUNNEL RELEASE Right 12/23/2014   Procedure: CARPAL TUNNEL RELEASE;  Surgeon: Kayla Pinal, MD;  Location: [facility];  Service: Orthopedics;  Laterality: Right;   CARPAL TUNNEL RELEASE Left 01/06/2015   Procedure: CARPAL TUNNEL RELEASE;  Surgeon: Kayla Pinal, MD;  Location: [facility];  Service: Orthopedics;  Laterality: Left;   COLONOSCOPY WITH PROPOFOL  N/A 10/08/2017   Procedure: COLONOSCOPY WITH PROPOFOL ;  Surgeon: Unk Corinn Skiff, MD;  Location: [facility];  Service: Gastroenterology;  Laterality: N/A;   COLONOSCOPY WITH PROPOFOL  N/A 06/07/2022   Procedure: COLONOSCOPY WITH PROPOFOL ;  Surgeon: Unk Corinn Skiff, MD;  Location: [facility];  Service: Gastroenterology;  Laterality: N/A;   ENTEROSCOPY  06/07/2022   Procedure: ENTEROSCOPY;  Surgeon: Unk Corinn Skiff, MD;  Location[facility];  Service: Gastroenterology;;   ESOPHAGOGASTRODUODENOSCOPY (EGD) WITH PROPOFOL  N/A 06/07/2022   Procedure: ESOPHAGOGASTRODUODENOSCOPY (EGD) WITH PROPOFOL ;  Surgeon: Unk Corinn Skiff, MD;  Location: [facility];  Service: Gastroenterology;  Laterality: N/A;   GASTRIC BYPASS  01/17/2000   KNEE SURGERY Right    knee trauma x3   prostate seeding     REVERSE SHOULDER ARTHROPLASTY Right 09/27/2021   Procedure: Right reverse shoulder arthroplasty, biceps tenodesis;  Surgeon: Tobie Priest, MD;  Location: [facility];  Service: Orthopedics;  Laterality: Right;   REVERSE SHOULDER ARTHROPLASTY Left 02/20/2023   Procedure: Left reverse shoulder arthroplasty,  biceps tenodesis;  Surgeon: Tobie Priest, MD;  Location: [facility];  Service: Orthopedics;  Laterality: Left;   SHOULDER ARTHROSCOPY WITH ROTATOR CUFF REPAIR AND OPEN BICEPS TENODESIS Left 12/27/2021   Procedure: Left shoulder arthroscopic cuff repair (supraspinatus and subscapularis) with Regeneten Patch application;  Surgeon: Tobie Priest, MD;  Location: [facility];  Service: Orthopedics;  Laterality: Left;   Patient Active Problem List   Diagnosis Date Noted   Anticoagulated 09/14/2023   Acute diverticulitis 09/13/2023   Chronic diastolic CHF (congestive heart failure) (HCC) 09/13/2023   Symptomatic anemia 04/26/2022   Chronic pain of both knees 03/30/2022   Bilateral primary osteoarthritis of knee 03/30/2022   Shortness of breath 03/24/2022   Vitamin D  deficiency 02/24/2022   S/p reverse total shoulder arthroplasty 09/27/2021   Lesion of bone of lumbosacral spine (L5) 05/12/2021   Localized osteoarthritis of shoulder regions, bilateral 03/29/2021   Chronic pain of both shoulders 03/29/2021   Drug-induced  myopathy 02/21/2021   Trigger finger of right hand 11/15/2020   Prostate cancer (HCC) 07/26/2020   Insomnia 08/01/2019   Primary osteoarthritis of both wrists 02/17/2019   Trigger middle finger of left hand 02/17/2019   Chronic pain syndrome 02/17/2019   Chronic radicular lumbar pain 10/08/2018   Lumbar radiculopathy 10/08/2018   Lumbar degenerative disc disease 10/08/2018   Lumbar facet arthropathy 10/08/2018   Lumbar facet joint syndrome 10/08/2018   Intervertebral disc disorder with radiculopathy of lumbosacral region    Osteoarthritis of knee 11/30/2017   Osteoarthritis of wrist 11/30/2017   Pernicious anemia 05/22/2016   Paroxysmal atrial fibrillation 12/20/2015   Carpal tunnel syndrome 11/16/2014   DJD (degenerative joint disease) of knee 10/27/2014   Bilateral carpal tunnel syndrome 10/27/2014   Morbid obesity with BMI of 45.0-49.9, adult (HCC) 10/27/2014   Mixed  hyperlipidemia 10/27/2014   H/O gastric bypass 03/20/2014   Hyperkalemia 03/20/2014   History of prostate cancer 12/26/2013   Essential hypertension 12/26/2013   Encounter for anticoagulation discussion and counseling 12/26/2013   BRBPR (bright red blood per rectum) 12/26/2013   PCP: Dr. Edman  REFERRING PROVIDER: Dr. Earnestine Blanch  REFERRING DIAG: [phone] (ICD-10-CM) - Complete rotator cuff tear or rupture of left shoulder, not specified as traumatic   RATIONALE FOR EVALUATION AND TREATMENT: Rehabilitation  THERAPY DIAG: Chronic left shoulder pain  Muscle weakness (generalized)  ONSET DATE: 02/20/23  FOLLOW-UP APPT SCHEDULED WITH REFERRING PROVIDER: Yes   FROM INITIAL EVALUATION SUBJECTIVE:                                                                                                                                                                                         SUBJECTIVE STATEMENT:  L reverse TSR 02/20/23  PERTINENT HISTORY:  ANTHANY THORNHILL is a 68 y.o. male who initially underwent arthroscopic subscapularis repair and side-to-side supraspinatus repair with Regeneten patch application by Dr. Blanch 12/27/2021. He underwent post-surgical physical therapy with initial minimal improvement followed by worsening shoulder strength/function. Repeat MRI showed large, retracted subscapularis re-tear and supraspinatus re-tear with signs of rotator cuff arthropathy. Conservative measures including medications, physical therapy, and cortisone injections did not provide adequate relief. The patient elected to proceed with reverse shoulder arthroplasty and biceps tenodesis on 02/20/23. Intraoperative findings include retracted full-thickness subscapularis tear, partial-thickness supraspinatus tear, and biceps tendinopathy. Surgery was a standard deltopectoral incision under general anesthesia along with an interscalene block. Per op note, operative arm to remain in sling at all times except  ROM exercises and hygiene. Can perform pendulums, elbow/wrist/hand ROM exercises. Passive ROM allowed to 90 FF and 30 ER. ASA 325mg  x 6 weeks for  DVT ppx (pt states that he was actually instructed to start back on his Xarelto  and not to use ASA). Plan for PT starting on POD #3-4. Pt denies any post-operative complications from surgery. Pt has a history of chronic back pain and has undergone multiple lumbar facet, medial branch radiofrequency ablations. He also has a past history of cardiomyopathy (in setting of Afib), DJD (degenerative joint disease) of knee, kidney stones, lower extremity edema, PAF (paroxysmal atrial fibrillation), pernicious anemia, prostate cancer, and a pulmonary nodule. He also has a past surgical history that includes R reverse TSR, knee surgery, gastric bypass (2002), prostate seeding, carpal tunnel release (Right, 12/23/2014; Left, 01/06/2015), and multiple cardioversions.   PAIN:  Pain Intensity: No resting pain upon arrival Pain location: L shoulder Pain Quality: Nerve pain Radiating: No  Numbness/Tingling: No Focal Weakness: Yes 24-hour pain behavior: Varies History of prior shoulder or neck/shoulder injury, pain, surgery, or therapy: Yes PT for L shoulder s/p RTC repair, history of R reverse TSR; Dominant hand: right Red flags Positive: history of prostate CA, Negative: chills/fever, night sweats, nausea, vomiting, unrelenting pain, unexplained weight gain/loss  PRECAUTIONS: Shoulder, per operative note: operative arm to remain in sling at all times except ROM exercises and hygiene. Can perform pendulums, elbow/wrist/hand ROM exercises. Passive ROM allowed to 90 FF and 30 ER.   General reverse TSR precautions include no combined shoulder extension, IR, and adduction. No shoulder and elbow AROM (bicep tenodesis) for at least the first 4 weeks   WEIGHT BEARING RESTRICTIONS: Yes no WB through LUE  FALLS: Has patient fallen in last 6 months? 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  Recent memory is intact.  Remote memory is intact.  Attention span and concentration are intact.  Expressive speech is intact.  Patient's fund of knowledge is within normal limits for educational level.    Gross Musculoskeletal Assessment Tremor: None Bulk: Normal Tone: Normal Incision is clean and dry with minimal blood staining on honeycomb dressing as well as minimal swelling around the incision. Skin is cool to touch and no signs of infection present.   Gait Deferred full gait assessment  Posture Forward head  with upper thoracic kyphosis and rounded shoulders  Cervical Screen Deferred  ROM ROM (Normal range in degrees)    Right (AROM) Left (PROM)  Shoulder    Flexion 160 90 (limited per protocol)  Extension    Abduction 180 90 (scaption)  External Rotation 88 0  Internal Rotation 70 stomach  Hands Behind Head    Hands Behind Back        Elbow    Flexion Full Full  Extension 0 0  Pronation WNL Full palm up  Supination WNL Full palm down  (* = pain; Blank rows = not tested)  UE MMT: MMT (out of 5) Right Left   Shoulder   Flexion 5   Extension    Abduction 5   External rotation  5   Internal rotation 5   Horizontal abduction    Horizontal adduction    Lower Trapezius    Rhomboids         Elbow  Flexion 5   Extension 5   Pronation 5   Supination 5       Wrist  Flexion 5 5  Extension 5 5  Radial deviation  5  Ulnar deviation  5      MCP  Flexion 5 5  Extension 5 5  Abduction 5 5  Adduction 5 5  (* = pain; Blank rows = not tested)  AROM AROM     Right Left  Shoulder    Flexion 180 160  Extension    Abduction 152 138  External Rotation 90 88  Internal Rotation 70 74  Hands Behind Head C6 C3  Hands Behind Back L1 Lateral waistline  (* = pain; Blank rows = not tested)  07/31/23 UE MMT: MMT (out of 5) Right Left   Shoulder   Flexion 5 4*  Abduction 5 4*  External rotation (seated) 5 4*  Internal rotation (seated) 4+ 4+*  Horizontal abduction Unable to lay prone Unable to lay prone  Horizontal adduction    Lower Trapezius    Rhomboids        Elbow  Flexion 5 5  Extension 5 5  Pronation 5 5  Supination 5 5*   Shoulder Flexion  Peak Force (lb) Right 21.09 lb Left 10.19 lb Strength Difference 10.90 lb* Percentage Difference 69.66%*  Shoulder Abduction Peak Force (lb) Right 14.57 lb Left 8.33 lb Strength Difference 6.24 lb* Percentage Difference 54.48%*  Shoulder External Rotation Peak Force (lb) Right 20.28 lb Left 16.90 lb Strength Difference 3.37 lb* Percentage Difference 18.15%*  Shoulder Internal Rotation Peak Force (lb) Right 19.77 lb Left 20.43 lb Strength Difference 0.66 lb Percentage Difference 3.28%  10/11/23: AROM AROM     Right Left  Shoulder    Flexion  156  Extension    Abduction/Scaption  84*  External Rotation  50*  Internal Rotation  54*  Hands Behind Head    Hands Behind Back    (* = pain; Blank rows = not tested)  UE MMT: MMT (out of 5) Right Left   Shoulder   Flexion  4*  Abduction  4*  External rotation (seated)  4*  Internal rotation (seated)  4+*  Horizontal abduction  Unable to lay prone  Horizontal adduction    Lower Trapezius    Rhomboids        Elbow  Flexion  5  Extension  5   Pronation  5  Supination  5    TODAY'S TREATMENT: 11/01/2023    Subjective:  Pt states that he his doing alright today. He returned home after last session and performed an EKG with his home monitor and it was mostly suggestive that he was not in a-fib. He saw Dr. Sharrie who performed a L shoulder steroid injection. No resting L shoulder pain upon arrival but he does report some post-injection soreness. No specific questions currently.   PAIN: Post-injection soreness   Ther-ex  In semi-recumbent position: L shoulder flexion with 2# dumbbell 2 x 10; L shoulder scaption, unweighted, 32x 10; L shoulder overhead press with 2# DB 2 x 10; L shoulder manually resisted ER 2 x 10; L shoulder manually resisted IR 2 x 10; L elbow curl with with manual resistance 2 x 10; L elbow manually resisted extension 2 x  10; L shoulder isometric extension 3s hold 2 x 10; L shoulder isometric abduction 3s hold x 10; Dowel chest press with manual resistance from therapist 2 x 10; Dowel rows with manual resistance 2 x 10; Seated scapular retractions x 10;   Manual Therapy  Extensive STM to L upper/mid/low trap, cervical paraspinals, and rhomboids in sitting;   Not performed: Nautilus lat pull downs with hand grips 95# 3 x 10; Nautilus rows with hand grips 50# 3 x 10; Seated pulleys for L shoulder AAROM overhead flexion and abduction x multiple bouts each direction; L shoulder isometric abduction and extension 2 x 10 each; Seated W's with green tband 2 x 10; Seated shoulder rolls 2 x 10; Seated horizontal abduction with blue tband 2 x 10; Seated L shoulder extension with blue tband 2 x 10;   HOME EXERCISE PROGRAM:  Access Code: [email or web address] URL: [email or web address] Date: 07/31/2023 Prepared by: Selinda Eck  Exercises - Seated Cervical Sidebending Stretch (Mirrored)  - 2 x daily - 7 x weekly - 3 reps - 30s hold - Seated Scapular Retraction  - 2 x daily - 7 x weekly - 2  sets - 10 reps - 3s hold - Standing Shoulder Flexion Full Range Single Arm  - 1 x daily - 7 x weekly - 2 sets - 10 reps - 3s hold - Standing Shoulder Abduction Full Range Single Arm  - 1 x daily - 7 x weekly - 2 sets - 10 reps - 3s hold - Wrist Flexion AROM  - 2 x daily - 7 x weekly - 2 sets - 10 reps - 3s hold - Wrist Extension AROM  - 1 x daily - 7 x weekly - 2 sets - 10 reps - 3s hold - Shoulder External Rotation and Scapular Retraction with Resistance  - 1 x daily - 7 x weekly - 2 sets - 10 reps - 3s hold    ASSESSMENT: Continued strengthening with patient during session today. He had a steroid injection in his L shoulder a few days ago and continues with some post-injection soreness. In order not to aggravate L shoulder regressed to 2# dumbbells today. His pulse still feels irregularly irregular and he has a history of frequent afib. Pt encouraged to continue monitoring at home and notify his provider if concerns arise. Mr. Gregori would benefit from ongoing PT to prevent additional decline, regain L shoulder strength, and decrease pain. Pt encouraged to continue HEP and follow up as directed. He will continue to benefit from skilled therapy to address remaining deficits in L shoulder weakness and pain order to improve quality of life and return to PLOF.     OBJECTIVE IMPAIRMENTS: decreased ROM, decreased strength, and pain.   ACTIVITY LIMITATIONS: carrying, lifting, bathing, toileting, dressing, and reach over head  PARTICIPATION LIMITATIONS: meal prep, cleaning, laundry, driving, shopping, community activity, and yard work  PERSONAL FACTORS: Past/current experiences, Time since onset of injury/illness/exacerbation, and 3+ comorbidities: OA, anemia, anxiety, cardiomyopathy, chronic pain are also affecting patient's functional outcome.   REHAB POTENTIAL: Good  CLINICAL DECISION MAKING: Unstable/unpredictable  EVALUATION COMPLEXITY: High   GOALS: Goals reviewed with patient?  Yes  SHORT TERM GOALS: Target date: 07/09/2023   Pt will be independent with HEP to improve strength and decrease shoulder pain to improve pain-free function at home and work. Baseline: Issued and progressing Goal status: ONGOING   LONG TERM GOALS: Target date: 08/20/2023  1.  Pt will report no further L shoulder pain with all AROM in  order to complete all ADLs and improve pain-free function. Baseline: 05/28/23: worst pain 7/10; 07/31/23: 10/10; 10/12/23: 8/10; Goal status: ONGOING  2.  Pt will decrease QuickDASH score to below 30% in order to demonstrate clinically significant reduction in disability related to shoulder pain        Baseline: 02/26/23: 72.7%; 05/28/23: 40.9%; 07/31/23: 56.8%; 10/11/23: 59.1% Goal status: PARTIALLY MET  3. Pt will increase strength of pain-free L shoulder flexion and scaption to at least 4/5 in order to demonstrate improvement in strength and function         Baseline: 02/26/23: Unable to test; 05/28/23: flexion: 4+/5, pain free, scaption: 4/5, pain; 07/31/23: see note above, painful; 10/11/23:  flexion: 4/5 with pain, scaption: 4/5 with pain; Goal status: PARTIALLY MET  4. Pt will improve L shoulder AROM flexion and scaption to at least 110 degrees in order to demonstrate improvement in function so he can take care of his property and complete all household responsibilities;        Baseline: 02/26/23: Unable to test (90 degrees PROM for both), 05/28/23: L shoulder AROM against gravity: flexion: 176, no pain, scaption: 130, pain/burning; 07/31/23: see note; 10/12/23: flexion: 156 with pain, scaption: 84 with pain Goal status: PARTIALLY MET   PLAN: PT FREQUENCY: 1-2x/week  PT DURATION: 12 weeks  PLANNED INTERVENTIONS: Therapeutic exercises, Therapeutic activity, Neuromuscular re-education, Balance training, Gait training, Patient/Family education, Self Care, Joint mobilization, Joint manipulation, Vestibular training, Canalith repositioning, Orthotic/Fit training, DME  instructions, Dry Needling, Electrical stimulation, Spinal manipulation, Spinal mobilization, Cryotherapy, Moist heat, Taping, Traction, Ultrasound, Ionotophoresis 4mg /ml Dexamethasone , Manual therapy, and Re-evaluation.  PLAN FOR NEXT SESSION: progress HEP, progress L shoulder ROM and strength per surgical protocol;  Selinda BIRCH Oneal Schoenberger PT, DPT, GCS  8:38 AM,11/02/23

## 2023-10-29 NOTE — Telephone Encounter (Signed)
 Pt called to see if could he get another injection in the same two fingers. I do not see any request. Does the Pt need to come in for another eval? Pt was last seen on 10/03/23

## 2023-10-30 ENCOUNTER — Ambulatory Visit

## 2023-10-31 DIAGNOSIS — M65332 Trigger finger, left middle finger: Secondary | ICD-10-CM | POA: Diagnosis not present

## 2023-10-31 DIAGNOSIS — M79644 Pain in right finger(s): Secondary | ICD-10-CM | POA: Diagnosis not present

## 2023-10-31 DIAGNOSIS — M65341 Trigger finger, right ring finger: Secondary | ICD-10-CM | POA: Diagnosis not present

## 2023-10-31 DIAGNOSIS — M79645 Pain in left finger(s): Secondary | ICD-10-CM | POA: Diagnosis not present

## 2023-10-31 DIAGNOSIS — M25531 Pain in right wrist: Secondary | ICD-10-CM | POA: Diagnosis not present

## 2023-10-31 DIAGNOSIS — M65331 Trigger finger, right middle finger: Secondary | ICD-10-CM | POA: Diagnosis not present

## 2023-11-01 ENCOUNTER — Ambulatory Visit

## 2023-11-01 DIAGNOSIS — M25512 Pain in left shoulder: Secondary | ICD-10-CM | POA: Diagnosis not present

## 2023-11-01 DIAGNOSIS — G8929 Other chronic pain: Secondary | ICD-10-CM

## 2023-11-01 DIAGNOSIS — M6281 Muscle weakness (generalized): Secondary | ICD-10-CM

## 2023-11-02 ENCOUNTER — Other Ambulatory Visit: Payer: Self-pay | Admitting: Medical Genetics

## 2023-11-02 ENCOUNTER — Encounter
Admission: RE | Disposition: A | Discharge status: Home / Self Care | Payer: Self-pay | Source: Home / Self Care | Attending: Gastroenterology

## 2023-11-02 ENCOUNTER — Encounter: Payer: Self-pay | Admitting: Gastroenterology

## 2023-11-02 ENCOUNTER — Ambulatory Visit: Admitting: General Practice

## 2023-11-02 ENCOUNTER — Ambulatory Visit
Admission: RE | Admit: 2023-11-02 | Discharge: 2023-11-02 | Disposition: A | Discharge status: Home / Self Care | Attending: Gastroenterology | Admitting: Gastroenterology

## 2023-11-02 DIAGNOSIS — G709 Myoneural disorder, unspecified: Secondary | ICD-10-CM | POA: Diagnosis not present

## 2023-11-02 DIAGNOSIS — Z59868 Other specified financial insecurity: Secondary | ICD-10-CM | POA: Diagnosis not present

## 2023-11-02 DIAGNOSIS — Z87891 Personal history of nicotine dependence: Secondary | ICD-10-CM | POA: Insufficient documentation

## 2023-11-02 DIAGNOSIS — I2510 Atherosclerotic heart disease of native coronary artery without angina pectoris: Secondary | ICD-10-CM | POA: Diagnosis not present

## 2023-11-02 DIAGNOSIS — E66813 Obesity, class 3: Secondary | ICD-10-CM | POA: Diagnosis not present

## 2023-11-02 DIAGNOSIS — K449 Diaphragmatic hernia without obstruction or gangrene: Secondary | ICD-10-CM | POA: Diagnosis not present

## 2023-11-02 DIAGNOSIS — I5032 Chronic diastolic (congestive) heart failure: Secondary | ICD-10-CM | POA: Insufficient documentation

## 2023-11-02 DIAGNOSIS — R197 Diarrhea, unspecified: Secondary | ICD-10-CM | POA: Diagnosis not present

## 2023-11-02 DIAGNOSIS — Z6841 Body Mass Index (BMI) 40.0 and over, adult: Secondary | ICD-10-CM | POA: Insufficient documentation

## 2023-11-02 DIAGNOSIS — I110 Hypertensive heart disease with heart failure: Secondary | ICD-10-CM | POA: Insufficient documentation

## 2023-11-02 DIAGNOSIS — D649 Anemia, unspecified: Secondary | ICD-10-CM | POA: Insufficient documentation

## 2023-11-02 DIAGNOSIS — K529 Noninfective gastroenteritis and colitis, unspecified: Secondary | ICD-10-CM | POA: Insufficient documentation

## 2023-11-02 DIAGNOSIS — Z79899 Other long term (current) drug therapy: Secondary | ICD-10-CM | POA: Diagnosis not present

## 2023-11-02 DIAGNOSIS — K5730 Diverticulosis of large intestine without perforation or abscess without bleeding: Secondary | ICD-10-CM | POA: Insufficient documentation

## 2023-11-02 DIAGNOSIS — Z006 Encounter for examination for normal comparison and control in clinical research program: Secondary | ICD-10-CM

## 2023-11-02 DIAGNOSIS — F419 Anxiety disorder, unspecified: Secondary | ICD-10-CM | POA: Insufficient documentation

## 2023-11-02 DIAGNOSIS — K6389 Other specified diseases of intestine: Secondary | ICD-10-CM | POA: Diagnosis not present

## 2023-11-02 HISTORY — PX: FLEXIBLE SIGMOIDOSCOPY: SHX5431

## 2023-11-02 SURGERY — SIGMOIDOSCOPY, FLEXIBLE
Anesthesia: General

## 2023-11-02 MED ORDER — LIDOCAINE HCL (CARDIAC) PF 100 MG/5ML IV SOSY
PREFILLED_SYRINGE | INTRAVENOUS | Status: DC | PRN
  Administered 2023-11-02: 50 mg via INTRAVENOUS

## 2023-11-02 MED ORDER — SODIUM CHLORIDE 0.9 % IV SOLN: INTRAVENOUS | Status: DC

## 2023-11-02 MED ADMIN — PROPOFOL 200 MG/20ML IV EMUL: 40 mg | INTRAVENOUS | NDC 00069020910

## 2023-11-02 MED ADMIN — PROPOFOL 200 MG/20ML IV EMUL: 100 mg | INTRAVENOUS | NDC 00069020910

## 2023-11-02 MED ADMIN — PROPOFOL 200 MG/20ML IV EMUL: 50 mg | INTRAVENOUS | NDC 00069020910

## 2023-11-02 NOTE — Anesthesia Postprocedure Evaluation (Signed)
 Anesthesia Post Note  Patient: [name]  Procedure(s) Performed: [facility] SIDE  Patient location during evaluation: Endoscopy Anesthesia Type: General Level of consciousness: awake and alert Pain management: pain level controlled Vital Signs Assessment: post-procedure vital signs reviewed and stable Respiratory status: spontaneous breathing, nonlabored ventilation, respiratory function stable and patient connected to nasal cannula oxygen Cardiovascular status: blood pressure returned to baseline and stable Postop Assessment: no apparent nausea or vomiting Anesthetic complications: no   No notable events documented.   Last Vitals:  Vitals:   11/02/23 1215 11/02/23 1225  BP: 121/83 127/81  Pulse: 78 76  Resp: 12 19  Temp:    SpO2: 97% 99%    Last Pain:  Vitals:   11/02/23 1225  TempSrc:   PainSc: 0-No pain                 Debby Mines

## 2023-11-02 NOTE — Transfer of Care (Signed)
 Immediate Anesthesia Transfer of Care Note  Patient: [name]  Procedure(s) Performed: [facility] SIDE  Patient Location: Endoscopy Unit  Anesthesia Type:General  Level of Consciousness: awake, alert , and oriented  Airway & Oxygen Therapy: Patient Spontanous Breathing  Post-op Assessment: Report given to RN, Post -op Vital signs reviewed and stable, and Patient moving all extremities  Post vital signs: Reviewed and stable  Last Vitals:  Vitals Value Taken Time  BP    Temp    Pulse 87 11/02/23 12:06  Resp 16 11/02/23 12:07  SpO2 96 % 11/02/23 12:06  Vitals shown include unfiled device data.  Last Pain:  Vitals:   11/02/23 1053  TempSrc: Temporal  PainSc: 0-No pain         Complications: No notable events documented.

## 2023-11-02 NOTE — Op Note (Signed)
 [facility] Gastroenterology Patient Name: [name] Procedure Date: 11/02/2023 11:33 AM MRN: [id number] Account #: [id number] Date of Birth: [birth year] Admit Type: Outpatient Age: 68 Room: [facility] ENDO ROOM 4 Gender: Male Note Status: Finalized Instrument Name: Upper GI Scope [id number] Procedure:             Flexible Sigmoidoscopy Indications:           Chronic diarrhea Providers:             Corinn Jess Brooklyn MD, MD Medicines:             General Anesthesia Complications:         No immediate complications. Estimated blood loss: None. Procedure:             Pre-Anesthesia Assessment:                        - Prior to the procedure, a History and Physical was                         performed, and patient medications and allergies were                         reviewed. The patient is competent. The risks and                         benefits of the procedure and the sedation options and                         risks were discussed with the patient. All questions                         were answered and informed consent was obtained.                         Patient identification and proposed procedure were                         verified by the physician, the nurse, the                         anesthesiologist, the anesthetist and the technician                         in the pre-procedure area in the procedure room in the                         endoscopy suite. Mental Status Examination: alert and                         oriented. Airway Examination: normal oropharyngeal                         airway and neck mobility. Respiratory Examination:                         clear to auscultation. CV Examination: irregularly                         irregular rate and  rhythm. Prophylactic Antibiotics:                         The patient does not require prophylactic antibiotics.                         Prior Anticoagulants: The patient has taken Xarelto                           (rivaroxaban ), last dose was 5 days prior to                         procedure. ASA Grade Assessment: III - A patient with                         severe systemic disease. After reviewing the risks and                         benefits, the patient was deemed in satisfactory                         condition to undergo the procedure. The anesthesia                         plan was to use general anesthesia. Immediately prior                         to administration of medications, the patient was                         re-assessed for adequacy to receive sedatives. The                         heart rate, respiratory rate, oxygen saturations,                         blood pressure, adequacy of pulmonary ventilation, and                         response to care were monitored throughout the                         procedure. The physical status of the patient was                         re-assessed after the procedure.                        After obtaining informed consent, the scope was passed                         under direct vision. The Endoscope was introduced                         through the anus and advanced to the the descending                         colon. The flexible sigmoidoscopy was performed with  moderate difficulty due to significant looping and the                         patient's body habitus. Successful completion of the                         procedure was aided by applying abdominal pressure.                         The patient tolerated the procedure well. The quality                         of the bowel preparation was adequate. Findings:      The perianal and digital rectal examinations were normal. Pertinent       negatives include normal sphincter tone and no palpable rectal lesions.      The entire examined colon appeared normal. Normal retroflexion in the       rectum      Multiple large-mouthed diverticula were found in  the sigmoid colon.      Normal mucosa was found in the rectum, in the recto-sigmoid colon, in       the sigmoid colon and in the descending colon. Biopsies for histology       were taken with a cold forceps for evaluation of microscopic colitis.       Estimated blood loss: none. Impression:            - The entire examined colon is normal.                        - Diverticulosis in the sigmoid colon.                        - Normal mucosa in the rectum, in the recto-sigmoid                         colon, in the sigmoid colon and in the descending                         colon. Biopsied. Recommendation:        - Discharge patient to home (with escort).                        - Resume previous diet today.                        - Resume Xarelto  (rivaroxaban ) today at prior dose.                         Refer to managing physician for further adjustment of                         therapy. Procedure Code(s):     --- Professional ---                        [phone], Sigmoidoscopy, flexible; with biopsy, single or                         multiple Diagnosis Code(s):     ---  Professional ---                        K52.9, Noninfective gastroenteritis and colitis,                         unspecified                        K57.30, Diverticulosis of large intestine without                         perforation or abscess without bleeding CPT copyright 2022 American Medical Association. All rights reserved. The codes documented in this report are preliminary and upon coder review may  be revised to meet current compliance requirements. Dr. Corinn Brooklyn Corinn Jess Brooklyn MD, MD 11/02/2023 12:09:04 PM This report has been signed electronically. Number of Addenda: 0 Note Initiated On: 11/02/2023 11:33 AM Scope Withdrawal Time: 0 hours 7 minutes 57 seconds  Total Procedure Duration: 0 hours 13 minutes 48 seconds  Estimated Blood Loss:  Estimated blood loss: none.      [facility]

## 2023-11-02 NOTE — H&P (Signed)
 Corinn [name] Brooklyn, MD [facility] Gastroenterology, [address]  [place], [place] [address]  Main: [phone] Fax:  [phone] Pager: [phone]   Primary Care Physician:  Edman Marsa PARAS, DO Primary Gastroenterologist:  Dr. Corinn [name] Brooklyn  Pre-Procedure History & Physical: HPI:  [name] is a 68 y.o. male is here for an flexible sigmoidoscopy.   Past Medical History:  Diagnosis Date   (HFpEF) heart failure with preserved ejection fraction (HCC)    a.) TTE 12/26/2013: EF 45-50%, mild ant and antsept HK. mild MR. Mod dil LA. nl RV fxn. Rhythm was Afib; b.) TTE 07/04/2019: EF 55%, mid-apical anteroseptal HK, mild MAC, mild Ao sclerosis, G2DD, RVSP 45.3; c.) TTE 06/02/2022: EF 55-60%, no RWMAs, sev LAE, RVSP 37.9, mild-mod MR, mild AR, AoV sclerosis without stenosis, asc Ao 38 mm   Anxiety    Aortic atherosclerosis    Cardiomyopathy (in setting of Afib)    a.) TTE 12/26/2013: EF 45-50%; b.) TTE 07/04/2019: EF 55%; c.) TTE 06/02/2022: EF 55-60%   Chronic pain syndrome    a.) followed by pain management   Chronic, continuous use of opioids    a.) on COT; followed by pain management   Coronary artery disease 11/06/2014   a.) cCTA 11/06/2014: Ca2+ = 224 (74th %ile; LAD distribution)   Diverticulosis    DJD (degenerative joint disease) of knee    Former smoker    History of hiatal hernia    History of kidney stones 2012   Hyperlipidemia    Hyperplastic colon polyp    Hypertension    Inguinal hernia, left    Internal hemorrhoids    Intervertebral disc disorder with radiculopathy of lumbosacral region    Lesion of bone of lumbosacral spine    L5   Long term current use of amiodarone     Lower extremity edema    Mixed hyperlipidemia    Morbid obesity (HCC)    Multiple falls    Myalgia due to statin    On rivaroxaban  therapy    Osteoarthritis    PAF (paroxysmal atrial fibrillation) (HCC)    a.) CHA2DS2VASc = 4 (age, HTN, CHF, vascular disease)  as of 02/19/23;  b.) s/p DCCV 03/13/14 (200 J), 05/15/19 (150 J x 1, 200 J x2), 05/19/2022 (150 J x1, 200 J x2; pads changed to ant/lat postion with additional 200 J x 2 -> did not convert), 07/25/22 (200 J), 09/21/2022 (200 J); c.) s/p PVI ablation 11/17/2022; d.) rate/rhythm maintained on amiodarone ; chronically anticoagulated on rivaroxaban    Pernicious anemia    Prostate cancer (HCC)    Pulmonary nodule, right    a. 10/2014 Cardiac CTA: 7mm RLL nodule; b. 04/2015 CT Chest: stable 7mm RLL nodule. No new nodules; 02/2017 CTA Chest: stable, benign, 7mm RLL pulm nodule.   Rotator cuff tear, left    Sleep difficulties    a.) takes melatonin + trazodone  PRN   SVT (supraventricular tachycardia)    Tubular adenoma of colon     Past Surgical History:  Procedure Laterality Date   ATRIAL FIBRILLATION ABLATION N/A 11/17/2022   Procedure: ATRIAL FIBRILLATION ABLATION; Location: [facility]; Surgeon: Gehi, Anil, MD   BICEPT TENODESIS Right 09/27/2021   Procedure: Right reverse shoulder arthroplasty, biceps tenodesis;  Surgeon: Tobie Priest, MD;  Location: [facility];  Service: Orthopedics;  Laterality: Right;   CARDIOVERSION N/A 05/15/2019   Procedure: CARDIOVERSION;  Surgeon: Perla Evalene PARAS, MD;  Location: [facility];  Service: Cardiovascular;  Laterality: N/A;  CARDIOVERSION N/A 03/13/2014   Procedure: CARDIOVERSION; Location: [facility]; Surgeon: Evalene Lunger, MD   CARDIOVERSION N/A 03/24/2022   Procedure: CARDIOVERSION;  Surgeon: Lunger Evalene PARAS, MD;  Location: [facility];  Service: Cardiovascular;  Laterality: N/A;   CARDIOVERSION N/A 05/19/2022   Procedure: CARDIOVERSION;  Surgeon: Lunger Evalene PARAS, MD;  Location: [facility];  Service: Cardiovascular;  Laterality: N/A;   CARDIOVERSION N/A 07/25/2022   Procedure: CARDIOVERSION;  Surgeon: Fernande Elspeth BROCKS, MD;  Location: [facility];  Service: Cardiovascular;  Laterality: N/A;   CARDIOVERSION N/A 09/21/2022   Procedure: CARDIOVERSION;  Surgeon: Fernande Elspeth BROCKS,  MD;  Location: [facility];  Service: Cardiovascular;  Laterality: N/A;   CARPAL TUNNEL RELEASE Right 12/23/2014   Procedure: CARPAL TUNNEL RELEASE;  Surgeon: Kayla Pinal, MD;  Location: [facility];  Service: Orthopedics;  Laterality: Right;   CARPAL TUNNEL RELEASE Left 01/06/2015   Procedure: CARPAL TUNNEL RELEASE;  Surgeon: Kayla Pinal, MD;  Location: [facility];  Service: Orthopedics;  Laterality: Left;   COLONOSCOPY WITH PROPOFOL  N/A 10/08/2017   Procedure: COLONOSCOPY WITH PROPOFOL ;  Surgeon: Unk Corinn Skiff, MD;  Location: [facility];  Service: Gastroenterology;  Laterality: N/A;   COLONOSCOPY WITH PROPOFOL  N/A 06/07/2022   Procedure: COLONOSCOPY WITH PROPOFOL ;  Surgeon: Unk Corinn Skiff, MD;  [facility];  Service: Gastroenterology;  Laterality: N/A;   ENTEROSCOPY  06/07/2022   Procedure: ENTEROSCOPY;  Surgeon: Unk Corinn Skiff, MD;  Location[facility];  Service: Gastroenterology;;   ESOPHAGOGASTRODUODENOSCOPY (EGD) WITH PROPOFOL  N/A 06/07/2022   Procedure: ESOPHAGOGASTRODUODENOSCOPY (EGD) WITH PROPOFOL ;  Surgeon: Unk Corinn Skiff, MD;  Location: [facility];  Service: Gastroenterology;  Laterality: N/A;   GASTRIC BYPASS  01/17/2000   KNEE SURGERY Right    knee trauma x3   prostate seeding     REVERSE SHOULDER ARTHROPLASTY Right 09/27/2021   Procedure: Right reverse shoulder arthroplasty, biceps tenodesis;  Surgeon: Tobie Priest, MD;  Location: [facility];  Service: Orthopedics;  Laterality: Right;   REVERSE SHOULDER ARTHROPLASTY Left 02/20/2023   Procedure: Left reverse shoulder arthroplasty, biceps tenodesis;  Surgeon: Tobie Priest, MD;  Location: [facility];  Service: Orthopedics;  Laterality: Left;   SHOULDER ARTHROSCOPY WITH ROTATOR CUFF REPAIR AND OPEN BICEPS TENODESIS Left 12/27/2021   Procedure: Left shoulder arthroscopic cuff repair (supraspinatus and subscapularis) with Regeneten Patch application;  Surgeon: Tobie Priest, MD;  Location: [facility];   Service: Orthopedics;  Laterality: Left;    Prior to Admission medications   Medication Sig Start Date End Date Taking? Authorizing Provider  amLODipine  (NORVASC ) 5 MG tablet Take 1 tablet (5 mg total) by mouth daily. 06/01/23  Yes Riddle, Suzann, NP  benazepril  (LOTENSIN ) 40 MG tablet TAKE ONE TABLET BY MOUTH ONE TIME DAILY 07/17/23  Yes Gollan, Timothy J, MD  busPIRone  (BUSPAR ) 10 MG tablet TAKE ONE TABLET BY MOUTH FOUR TIMES A DAY AS NEEDED FOR ANIXETY 06/21/23  Yes Karamalegos, Marsa PARAS, DO  Calcium -Magnesium-Vitamin D  (CITRACAL CALCIUM  +D3 PO)  04/01/23  Yes [provider]  cyanocobalamin  (VITAMIN B12) 1000 MCG/ML injection Inject 1 mL (1,000 mcg total) into the skin every 30 (thirty) days. 10/22/23  Yes Karamalegos, Marsa PARAS, DO  cyclobenzaprine  (FLEXERIL ) 10 MG tablet Take 1 tablet (10 mg total) by mouth at bedtime. 02/08/23  Yes Karamalegos, Marsa PARAS, DO  DULoxetine  (CYMBALTA ) 60 MG capsule Take 1 capsule (60 mg total) by mouth daily. 08/06/23  Yes Karamalegos, Marsa PARAS, DO  FERROUS BISGLYCINATE CHELATE PO Take 25 mg by mouth daily. Bronson Iron  Bisglycinate 25 mg Gentle  on The Stomach Supports Energy & Healthy Red Blood Cell Production   Yes [provider]  melatonin 3 MG TABS tablet Take 3 mg by mouth at bedtime. 03/04/21  Yes [provider]  Multiple Vitamin (MULTIVITAMIN WITH MINERALS) TABS tablet Take 1 tablet by mouth in the morning.   Yes [provider]  traZODone  (DESYREL ) 150 MG tablet TAKE ONE TABLET BY MOUTH AT BEDTIME 10/02/23  Yes Karamalegos, Marsa PARAS, DO  carboxymethylcellulose (REFRESH PLUS) 0.5 % SOLN Place 1-2 drops into both eyes 3 (three) times daily as needed (dry/irritated eyes.).    [provider]  cholecalciferol (VITAMIN D3) 25 MCG (1000 UNIT) tablet Take 1,000 Units by mouth daily.    [provider]  XARELTO  20 MG TABS tablet TAKE ONE TABLET BY MOUTH ONE TIME DAILY WITH SUPPER 04/16/23   Gollan, Timothy  J, MD    Allergies as of 10/08/2023 - Review Complete 10/03/2023  Allergen Reaction Noted   Bupivacaine  liposome Palpitations 02/07/2022    Family History  Problem Relation Age of Onset   Heart disease Father    Hypertension Father    Stroke Father    Cancer Father        prostate cancer   Cancer Sister    Arrhythmia Sister        A-fib   Arrhythmia Brother        A-fib   Cancer Brother     Social History   Socioeconomic History   Marital status: Married    Spouse name: Madeline   Number of children: Not on file   Years of education: Not on file   Highest education level: Associate degree: occupational, Scientist, product/process development, or vocational program  Occupational History   Not on file  Tobacco Use   Smoking status: Former    Current packs/day: 0.00    Average packs/day: 1 pack/day for 10.0 years (10.0 ttl pk-yrs)    Types: Cigarettes    Start date: 02/17/1969    Quit date: 02/18/1979    Years since quitting: 44.7   Smokeless tobacco: Former    Types: Snuff  Vaping Use   Vaping status: Never Used  Substance and Sexual Activity   Alcohol  use: Not Currently    Comment: last drink in 2021   Drug use: No   Sexual activity: Not on file  Other Topics Concern   Not on file  Social History Narrative   Lives in [place]; with wife, Madeline. Retd from [employer]; quit in 1980; used to drink alcohol ; no beer in last 2022. No indoor pets.   Social Drivers of Corporate investment banker Strain: High Risk (10/28/2023)   Received from [facility]   Overall Financial Resource Strain (CARDIA)    Difficulty of Paying Living Expenses: Hard  Food Insecurity: No Food Insecurity (10/28/2023)   Received from [facility]   Hunger Vital Sign    Within the past 12 months, you worried that your food would run out before you got the money to buy more.: Never true    Within the past 12 months, the food you bought just didn't last and you didn't have money to get  more.: Never true  Transportation Needs: No Transportation Needs (10/28/2023)   Received from [facility] - Transportation    In the past 12 months, has lack of transportation kept you from medical appointments or from getting medications?: No    Lack of Transportation (  Non-Medical): No  Physical Activity: Inactive (08/06/2023)   Exercise Vital Sign    Days of Exercise per Week: 0 days    Minutes of Exercise per Session: Not on file  Stress: No Stress Concern Present (08/06/2023)   [employer] of Occupational Health - Occupational Stress Questionnaire    Feeling of Stress: Only a little  Social Connections: Socially Integrated (09/13/2023)   Social Connection and Isolation Panel    Frequency of Communication with Friends and Family: Three times a week    Frequency of Social Gatherings with Friends and Family: Three times a week    Attends Religious Services: More than 4 times per year    Active Member of Clubs or Organizations: Yes    Attends Banker Meetings: More than 4 times per year    Marital Status: Married  Catering manager Violence: Not At Risk (09/13/2023)   Humiliation, Afraid, Rape, and Kick questionnaire    Fear of Current or Ex-Partner: No    Emotionally Abused: No    Physically Abused: No    Sexually Abused: No    Review of Systems: See HPI, otherwise negative ROS  Physical Exam: BP (!) 138/90   Pulse (!) 51   Temp (!) 97.2 F (36.2 C) (Temporal)   Resp 18   Ht 6' (1.829 m)   Wt (!) 158.8 kg   SpO2 98%   BMI 47.50 kg/m  General:   Alert,  pleasant and cooperative in NAD Head:  Normocephalic and atraumatic. Neck:  Supple; no masses or thyromegaly. Lungs:  Clear throughout to auscultation.    Heart:  Regular rate and rhythm. Abdomen:  Soft, nontender and nondistended. Normal bowel sounds, without guarding, and without rebound.   Neurologic:  Alert and  oriented x4;  grossly normal  neurologically.  Impression/Plan: [name] is here for an flexible sigmoidoscopy to be performed for rectal bleeding, chronic diarrhea  Risks, benefits, limitations, and alternatives regarding  flexible sigmoidoscopy have been reviewed with the patient.  Questions have been answered.  All parties agreeable.   Corinn Brooklyn, MD  11/02/2023, 11:06 AM

## 2023-11-02 NOTE — Anesthesia Preprocedure Evaluation (Signed)
 Anesthesia Evaluation  Patient identified by MRN, date of birth, ID band Patient awake    Reviewed: Allergy & Precautions, H&P , NPO status , Patient's Chart, lab work & pertinent test results, reviewed documented beta blocker date and time   Airway Mallampati: II  TM Distance: >3 FB Neck ROM: full    Dental  (+) Teeth Intact   Pulmonary shortness of breath, former smoker   Pulmonary exam normal        Cardiovascular Exercise Tolerance: Poor hypertension, On Medications + CAD  Normal cardiovascular exam Rhythm:regular Rate:Normal     Neuro/Psych  PSYCHIATRIC DISORDERS Anxiety      Neuromuscular disease    GI/Hepatic Neg liver ROS, hiatal hernia,,,  Endo/Other    Class 3 obesity  Renal/GU      Musculoskeletal   Abdominal   Peds  Hematology  (+) Blood dyscrasia, anemia   Anesthesia Other Findings Past Medical History: No date: (HFpEF) heart failure with preserved ejection fraction (HCC)     Comment:  a.) TTE 12/26/2013: EF 45-50%, mild ant and antsept HK.               mild MR. Mod dil LA. nl RV fxn. Rhythm was Afib; b.) TTE               07/04/2019: EF 55%, mid-apical anteroseptal HK, mild MAC,              mild Ao sclerosis, G2DD, RVSP 45.3; c.) TTE 06/02/2022:               EF 55-60%, no RWMAs, sev LAE, RVSP 37.9, mild-mod MR,               mild AR, AoV sclerosis without stenosis, asc Ao 38 mm No date: Anxiety No date: Aortic atherosclerosis (HCC) No date: Cardiomyopathy (in setting of Afib)     Comment:  a.) TTE 12/26/2013: EF 45-50%; b.) TTE 07/04/2019: EF               55%; c.) TTE 06/02/2022: EF 55-60% No date: Chronic pain syndrome     Comment:  a.) followed by pain management No date: Chronic, continuous use of opioids     Comment:  a.) on COT; followed by pain management 11/06/2014: Coronary artery disease     Comment:  a.) cCTA 11/06/2014: Ca2+ = 224 (74th %ile; LAD               distribution) No  date: Diverticulosis No date: DJD (degenerative joint disease) of knee No date: Former smoker No date: History of hiatal hernia 2012: History of kidney stones No date: Hyperlipidemia No date: Hyperplastic colon polyp No date: Hypertension No date: Inguinal hernia, left No date: Internal hemorrhoids No date: Intervertebral disc disorder with radiculopathy of  lumbosacral region No date: Lesion of bone of lumbosacral spine     Comment:  L5 No date: Long term current use of amiodarone  No date: Lower extremity edema No date: Mixed hyperlipidemia No date: Morbid obesity (HCC) No date: Multiple falls No date: Myalgia due to statin No date: On rivaroxaban  therapy No date: Osteoarthritis No date: PAF (paroxysmal atrial fibrillation) (HCC)     Comment:  a.) CHA2DS2VASc = 4 (age, HTN, CHF, vascular disease) as              of 02/19/23;  b.) s/p DCCV 03/13/14 (200 J), 05/15/19               (150 J x  1, 200 J x2), 05/19/2022 (150 J x1, 200 J x2;               pads changed to ant/lat postion with additional 200 J x 2              -> did not convert), 07/25/22 (200 J), 09/21/2022 (200               J); c.) s/p PVI ablation 11/17/2022; d.) rate/rhythm               maintained on amiodarone ; chronically anticoagulated on               rivaroxaban  No date: Pernicious anemia No date: Prostate cancer (HCC) No date: Pulmonary nodule, right     Comment:  a. 10/2014 Cardiac CTA: 7mm RLL nodule; b. 04/2015 CT               Chest: stable 7mm RLL nodule. No new nodules; 02/2017 CTA               Chest: stable, benign, 7mm RLL pulm nodule. No date: Rotator cuff tear, left No date: Sleep difficulties     Comment:  a.) takes melatonin + trazodone  PRN No date: SVT (supraventricular tachycardia) (HCC) No date: Tubular adenoma of colon Past Surgical History: 11/17/2022: ATRIAL FIBRILLATION ABLATION; N/A     Comment:  Procedure: ATRIAL FIBRILLATION ABLATION; Location: [facility];               Surgeon: Gehi,  Anil, MD 09/27/2021: BICEPT TENODESIS; Right     Comment:  Procedure: Right reverse shoulder arthroplasty, biceps               tenodesis;  Surgeon: Tobie Priest, MD;  Location: [facility]               ORS;  Service: Orthopedics;  Laterality: Right; 05/15/2019: CARDIOVERSION; N/A     Comment:  Procedure: CARDIOVERSION;  Surgeon: Perla Evalene PARAS,               MD;  Location: [facility] ORS;  Service: Cardiovascular;                Laterality: N/A; 03/13/2014: CARDIOVERSION; N/A     Comment:  Procedure: CARDIOVERSION; Location: [facility]; Surgeon:               Evalene Perla, MD 03/24/2022: CARDIOVERSION; N/A     Comment:  Procedure: CARDIOVERSION;  Surgeon: Perla Evalene PARAS,               MD;  Location: [facility];  Service: Cardiovascular;                Laterality: N/A; 05/19/2022: CARDIOVERSION; N/A     Comment:  Procedure: CARDIOVERSION;  Surgeon: Perla Evalene PARAS,               MD;  Location: [facility];  Service: Cardiovascular;                Laterality: N/A; 07/25/2022: CARDIOVERSION; N/A     Comment:  Procedure: CARDIOVERSION;  Surgeon: Fernande Elspeth BROCKS, MD;              Location: [facility];  Service: Cardiovascular;                Laterality: N/A; 09/21/2022: CARDIOVERSION; N/A     Comment:  Procedure: CARDIOVERSION;  Surgeon: Fernande Elspeth BROCKS, MD;  Location: [facility];  Service: Cardiovascular;                Laterality: N/A; 12/23/2014: CARPAL TUNNEL RELEASE; Right     Comment:  Procedure: CARPAL TUNNEL RELEASE;  Surgeon: Kayla Pinal, MD;  Location: [facility];  Service: Orthopedics;                Laterality: Right; 01/06/2015: CARPAL TUNNEL RELEASE; Left     Comment:  Procedure: CARPAL TUNNEL RELEASE;  Surgeon: Kayla Pinal, MD;  Location: [facility];  Service: Orthopedics;                Laterality: Left; 10/08/2017: COLONOSCOPY WITH PROPOFOL ; N/A     Comment:  Procedure: COLONOSCOPY WITH PROPOFOL ;  Surgeon: Unk Corinn Skiff,  MD;  Location: [facility];  Service:               Gastroenterology;  Laterality: N/A; 06/07/2022: COLONOSCOPY WITH PROPOFOL ; N/A     Comment:  Procedure: COLONOSCOPY WITH PROPOFOL ;  Surgeon: Unk Corinn Skiff, MD;  Location: [facility];  Service:               Gastroenterology;  Laterality: N/A; 06/07/2022: ENTEROSCOPY     Comment:  Procedure: ENTEROSCOPY;  Surgeon: Unk Corinn Skiff,               MD;  Location: [facility];  Service:               Gastroenterology;; 06/07/2022: ESOPHAGOGASTRODUODENOSCOPY (EGD) WITH PROPOFOL ; N/A     Comment:  Procedure: ESOPHAGOGASTRODUODENOSCOPY (EGD) WITH               PROPOFOL ;  Surgeon: Unk Corinn Skiff, MD;  Location:               [facility];  Service: Gastroenterology;  Laterality:               N/A; 01/17/2000: GASTRIC BYPASS No date: KNEE SURGERY; Right     Comment:  knee trauma x3 No date: prostate seeding 09/27/2021: REVERSE SHOULDER ARTHROPLASTY; Right     Comment:  Procedure: Right reverse shoulder arthroplasty, biceps               tenodesis;  Surgeon: Tobie Priest, MD;  Location: [facility]               ORS;  Service: Orthopedics;  Laterality: Right; 12/27/2021: SHOULDER ARTHROSCOPY WITH ROTATOR CUFF REPAIR AND OPEN  BICEPS TENODESIS; Left     Comment:  Procedure: Left shoulder arthroscopic cuff repair               (supraspinatus and subscapularis) with Regeneten Patch               application;  Surgeon: Tobie Priest, MD;  Location: [facility]               ORS;  Service: Orthopedics;  Laterality: Left;   Reproductive/Obstetrics negative OB ROS                              Anesthesia Physical Anesthesia Plan  ASA: 3  Anesthesia Plan:  General   Post-op Pain Management: Minimal or no pain anticipated   Induction: Intravenous  PONV Risk Score and Plan: 2 and Propofol  infusion and TIVA  Airway Management Planned: Nasal Cannula  Additional Equipment: None  Intra-op  Plan:   Post-operative Plan:   Informed Consent: I have reviewed the patients History and Physical, chart, labs and discussed the procedure including the risks, benefits and alternatives for the proposed anesthesia with the patient or authorized representative who has indicated his/her understanding and acceptance.     Dental advisory given  Plan Discussed with: CRNA and Surgeon  Anesthesia Plan Comments: (Discussed risks of anesthesia with patient, including possibility of difficulty with spontaneous ventilation under anesthesia necessitating airway intervention, PONV, and rare risks such as cardiac or respiratory or neurological events, and allergic reactions. Discussed the role of CRNA in patient's perioperative care. Patient understands.)         Anesthesia Quick Evaluation

## 2023-11-02 NOTE — Therapy (Signed)
 OUTPATIENT PHYSICAL THERAPY SHOULDER TREATMENT  Patient Name: [name] MRN: [id number] DOB:1955-06-09, 68 y.o., male Today's Date: 11/06/2023   PT End of Session - 11/06/23 1413     Visit Number 23    Number of Visits 49    Date for Recertification  01/03/24    Authorization Type eval: 02/26/23, [facility] Appeal Approved [id number] visits between 09/03/23-12/21/23  [facility] Medicare 2025  CO:Ajdzi on AUTH  Copay: $20  OOP:$3900/$0 used  [name] is req  Ref #:[id number]    Authorization - Visit Number 7    Authorization - Number of Visits 12    PT Start Time 1407    PT Stop Time 1445    PT Time Calculation (min) 38 min    Activity Tolerance Patient tolerated treatment well    Behavior During Therapy WFL for tasks assessed/performed         Past Medical History:  Diagnosis Date   (HFpEF) heart failure with preserved ejection fraction (HCC)    a.) TTE 12/26/2013: EF 45-50%, mild ant and antsept HK. mild MR. Mod dil LA. nl RV fxn. Rhythm was Afib; b.) TTE 07/04/2019: EF 55%, mid-apical anteroseptal HK, mild MAC, mild Ao sclerosis, G2DD, RVSP 45.3; c.) TTE 06/02/2022: EF 55-60%, no RWMAs, sev LAE, RVSP 37.9, mild-mod MR, mild AR, AoV sclerosis without stenosis, asc Ao 38 mm   Anxiety    Aortic atherosclerosis    Cardiomyopathy (in setting of Afib)    a.) TTE 12/26/2013: EF 45-50%; b.) TTE 07/04/2019: EF 55%; c.) TTE 06/02/2022: EF 55-60%   Chronic pain syndrome    a.) followed by pain management   Chronic, continuous use of opioids    a.) on COT; followed by pain management   Coronary artery disease 11/06/2014   a.) cCTA 11/06/2014: Ca2+ = 224 (74th %ile; LAD distribution)   Diverticulosis    DJD (degenerative joint disease) of knee    Former smoker    History of hiatal hernia    History of kidney stones 2012   Hyperlipidemia    Hyperplastic colon polyp    Hypertension    Inguinal hernia, left    Internal hemorrhoids    Intervertebral disc disorder with radiculopathy of lumbosacral  region    Lesion of bone of lumbosacral spine    L5   Long term current use of amiodarone     Lower extremity edema    Mixed hyperlipidemia    Morbid obesity (HCC)    Multiple falls    Myalgia due to statin    On rivaroxaban  therapy    Osteoarthritis    PAF (paroxysmal atrial fibrillation) (HCC)    a.) CHA2DS2VASc = 4 (age, HTN, CHF, vascular disease) as of 02/19/23;  b.) s/p DCCV 03/13/14 (200 J), 05/15/19 (150 J x 1, 200 J x2), 05/19/2022 (150 J x1, 200 J x2; pads changed to ant/lat postion with additional 200 J x 2 -> did not convert), 07/25/22 (200 J), 09/21/2022 (200 J); c.) s/p PVI ablation 11/17/2022; d.) rate/rhythm maintained on amiodarone ; chronically anticoagulated on rivaroxaban    Pernicious anemia    Prostate cancer (HCC)    Pulmonary nodule, right    a. 10/2014 Cardiac CTA: 7mm RLL nodule; b. 04/2015 CT Chest: stable 7mm RLL nodule. No new nodules; 02/2017 CTA Chest: stable, benign, 7mm RLL pulm nodule.   Rotator cuff tear, left    Sleep difficulties    a.) takes melatonin + trazodone  PRN   SVT (supraventricular tachycardia)    Tubular adenoma of  colon    Past Surgical History:  Procedure Laterality Date   ATRIAL FIBRILLATION ABLATION N/A 11/17/2022   Procedure: ATRIAL FIBRILLATION ABLATION; Location: [facility]; Surgeon: Gehi, Anil, MD   BICEPT TENODESIS Right 09/27/2021   Procedure: Right reverse shoulder arthroplasty, biceps tenodesis;  Surgeon: Tobie Priest, MD;  Location: [facility];  Service: Orthopedics;  Laterality: Right;   CARDIOVERSION N/A 05/15/2019   Procedure: CARDIOVERSION;  Surgeon: Perla Evalene PARAS, MD;  Location: [facility];  Service: Cardiovascular;  Laterality: N/A;   CARDIOVERSION N/A 03/13/2014   Procedure: CARDIOVERSION; Location: [facility]; Surgeon: Evalene Perla, MD   CARDIOVERSION N/A 03/24/2022   Procedure: CARDIOVERSION;  Surgeon: Perla Evalene PARAS, MD;  Location: [facility];  Service: Cardiovascular;  Laterality: N/A;   CARDIOVERSION N/A 05/19/2022    Procedure: CARDIOVERSION;  Surgeon: Perla Evalene PARAS, MD;  Location: [facility];  Service: Cardiovascular;  Laterality: N/A;   CARDIOVERSION N/A 07/25/2022   Procedure: CARDIOVERSION;  Surgeon: Fernande Elspeth BROCKS, MD;  Location: [facility];  Service: Cardiovascular;  Laterality: N/A;   CARDIOVERSION N/A 09/21/2022   Procedure: CARDIOVERSION;  Surgeon: Fernande Elspeth BROCKS, MD;  Location: [facility];  Service: Cardiovascular;  Laterality: N/A;   CARPAL TUNNEL RELEASE Right 12/23/2014   Procedure: CARPAL TUNNEL RELEASE;  Surgeon: Kayla Pinal, MD;  Location: [facility];  Service: Orthopedics;  Laterality: Right;   CARPAL TUNNEL RELEASE Left 01/06/2015   Procedure: CARPAL TUNNEL RELEASE;  Surgeon: Kayla Pinal, MD;  Location: [facility];  Service: Orthopedics;  Laterality: Left;   COLONOSCOPY WITH PROPOFOL  N/A 10/08/2017   Procedure: COLONOSCOPY WITH PROPOFOL ;  Surgeon: Unk Corinn Skiff, MD;  Location: [facility];  Service: Gastroenterology;  Laterality: N/A;   COLONOSCOPY WITH PROPOFOL  N/A 06/07/2022   Procedure: COLONOSCOPY WITH PROPOFOL ;  Surgeon: Unk Corinn Skiff, MD;  Location: [facility]COPY;  Service: Gastroenterology;  Laterality: N/A;   ENTEROSCOPY  06/07/2022   Procedure: ENTEROSCOPY;  Surgeon: Unk Corinn Skiff, MD;  Location: [facility];  Service: Gastroenterology;;   ESOPHAGOGASTRODUODENOSCOPY (EGD) WITH PROPOFOL  N/A 06/07/2022   Procedure: ESOPHAGOGASTRODUODENOSCOPY (EGD) WITH PROPOFOL ;  Surgeon: Unk Corinn Skiff, MD;  Location: [facility] ENDOSCOPY;  Service: Gastroenterology;  Laterality: N/A;   FLEXIBLE SIGMOIDOSCOPY N/A 11/02/2023   Procedure: KINGSTON SIDE;  Surgeon: Unk Corinn Skiff, MD;  Location: [facility];  Service: Gastroenterology;  Laterality: N/A;   GASTRIC BYPASS  01/17/2000   KNEE SURGERY Right    knee trauma x3   prostate seeding     REVERSE SHOULDER ARTHROPLASTY Right 09/27/2021   Procedure: Right reverse shoulder arthroplasty, biceps tenodesis;   Surgeon: Tobie Priest, MD;  Location: [facility];  Service: Orthopedics;  Laterality: Right;   REVERSE SHOULDER ARTHROPLASTY Left 02/20/2023   Procedure: Left reverse shoulder arthroplasty, biceps tenodesis;  Surgeon: Tobie Priest, MD;  Location: [facility];  Service: Orthopedics;  Laterality: Left;   SHOULDER ARTHROSCOPY WITH ROTATOR CUFF REPAIR AND OPEN BICEPS TENODESIS Left 12/27/2021   Procedure: Left shoulder arthroscopic cuff repair (supraspinatus and subscapularis) with Regeneten Patch application;  Surgeon: Tobie Priest, MD;  Location: [facility];  Service: Orthopedics;  Laterality: Left;   Patient Active Problem List   Diagnosis Date Noted   Anticoagulated 09/14/2023   Acute diverticulitis 09/13/2023   Chronic diastolic CHF (congestive heart failure) (HCC) 09/13/2023   Symptomatic anemia 04/26/2022   Chronic pain of both knees 03/30/2022   Bilateral primary osteoarthritis of knee 03/30/2022   Shortness of breath 03/24/2022   Vitamin D  deficiency 02/24/2022   S/p reverse total shoulder arthroplasty 09/27/2021   Lesion of  bone of lumbosacral spine (L5) 05/12/2021   Localized osteoarthritis of shoulder regions, bilateral 03/29/2021   Chronic pain of both shoulders 03/29/2021   Drug-induced myopathy 02/21/2021   Trigger finger of right hand 11/15/2020   Prostate cancer (HCC) 07/26/2020   Insomnia 08/01/2019   Primary osteoarthritis of both wrists 02/17/2019   Trigger middle finger of left hand 02/17/2019   Chronic pain syndrome 02/17/2019   Chronic radicular lumbar pain 10/08/2018   Lumbar radiculopathy 10/08/2018   Lumbar degenerative disc disease 10/08/2018   Lumbar facet arthropathy 10/08/2018   Lumbar facet joint syndrome 10/08/2018   Intervertebral disc disorder with radiculopathy of lumbosacral region    Osteoarthritis of knee 11/30/2017   Osteoarthritis of wrist 11/30/2017   Pernicious anemia 05/22/2016   Paroxysmal atrial fibrillation 12/20/2015   Carpal tunnel syndrome  11/16/2014   DJD (degenerative joint disease) of knee 10/27/2014   Bilateral carpal tunnel syndrome 10/27/2014   Morbid obesity with BMI of 45.0-49.9, adult (HCC) 10/27/2014   Mixed hyperlipidemia 10/27/2014   H/O gastric bypass 03/20/2014   Hyperkalemia 03/20/2014   History of prostate cancer 12/26/2013   Essential hypertension 12/26/2013   Encounter for anticoagulation discussion and counseling 12/26/2013   BRBPR (bright red blood per rectum) 12/26/2013   PCP: Dr. Edman  REFERRING PROVIDER: Dr. Earnestine Blanch  REFERRING DIAG: [phone] (ICD-10-CM) - Complete rotator cuff tear or rupture of left shoulder, not specified as traumatic   RATIONALE FOR EVALUATION AND TREATMENT: Rehabilitation  THERAPY DIAG: Chronic left shoulder pain  Muscle weakness (generalized)  ONSET DATE: 02/20/23  FOLLOW-UP APPT SCHEDULED WITH REFERRING PROVIDER: Yes   FROM INITIAL EVALUATION SUBJECTIVE:                                                                                                                                                                                         SUBJECTIVE STATEMENT:  L reverse TSR 02/20/23  PERTINENT HISTORY:  [name] is a 68 y.o. male who initially underwent arthroscopic subscapularis repair and side-to-side supraspinatus repair with Regeneten patch application by Dr. Blanch 12/27/2021. He underwent post-surgical physical therapy with initial minimal improvement followed by worsening shoulder strength/function. Repeat MRI showed large, retracted subscapularis re-tear and supraspinatus re-tear with signs of rotator cuff arthropathy. Conservative measures including medications, physical therapy, and cortisone injections did not provide adequate relief. The patient elected to proceed with reverse shoulder arthroplasty and biceps tenodesis on 02/20/23. Intraoperative findings include retracted full-thickness subscapularis tear, partial-thickness supraspinatus tear, and biceps  tendinopathy. Surgery was a standard deltopectoral incision under general anesthesia along with an interscalene block. Per op note, operative arm to remain in sling at all times  except ROM exercises and hygiene. Can perform pendulums, elbow/wrist/hand ROM exercises. Passive ROM allowed to 90 FF and 30 ER. ASA 325mg  x 6 weeks for DVT ppx (pt states that he was actually instructed to start back on his Xarelto  and not to use ASA). Plan for PT starting on POD #3-4. Pt denies any post-operative complications from surgery. Pt has a history of chronic back pain and has undergone multiple lumbar facet, medial branch radiofrequency ablations. He also has a past history of cardiomyopathy (in setting of Afib), DJD (degenerative joint disease) of knee, kidney stones, lower extremity edema, PAF (paroxysmal atrial fibrillation), pernicious anemia, prostate cancer, and a pulmonary nodule. He also has a past surgical history that includes R reverse TSR, knee surgery, gastric bypass (2002), prostate seeding, carpal tunnel release (Right, 12/23/2014; Left, 01/06/2015), and multiple cardioversions.   PAIN:  Pain Intensity: No resting pain upon arrival Pain location: L shoulder Pain Quality: Nerve pain Radiating: No  Numbness/Tingling: No Focal Weakness: Yes 24-hour pain behavior: Varies History of prior shoulder or neck/shoulder injury, pain, surgery, or therapy: Yes PT for L shoulder s/p RTC repair, history of R reverse TSR; Dominant hand: right Red flags Positive: history of prostate CA, Negative: chills/fever, night sweats, nausea, vomiting, unrelenting pain, unexplained weight gain/loss  PRECAUTIONS: Shoulder, per operative note: operative arm to remain in sling at all times except ROM exercises and hygiene. Can perform pendulums, elbow/wrist/hand ROM exercises. Passive ROM allowed to 90 FF and 30 ER.   General reverse TSR precautions include no combined shoulder extension, IR, and adduction. No shoulder and elbow  AROM (bicep tenodesis) for at least the first 4 weeks   WEIGHT BEARING RESTRICTIONS: Yes no WB through LUE  FALLS: Has patient fallen in last 6 months? 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  Recent memory is intact.  Remote memory is intact.  Attention span and concentration are intact.  Expressive speech is intact.  Patient's fund of knowledge is within normal limits for educational level.    Gross Musculoskeletal Assessment Tremor: None Bulk: Normal Tone: Normal Incision is clean and dry with minimal blood staining on honeycomb dressing as well as minimal swelling around the incision. Skin is cool to touch and no signs of infection present.   Gait Deferred full gait assessment  Posture Forward head  with upper thoracic kyphosis and rounded shoulders  Cervical Screen Deferred  ROM ROM (Normal range in degrees)    Right (AROM) Left (PROM)  Shoulder    Flexion 160 90 (limited per protocol)  Extension    Abduction 180 90 (scaption)  External Rotation 88 0  Internal Rotation 70 stomach  Hands Behind Head    Hands Behind Back        Elbow    Flexion Full Full  Extension 0 0  Pronation WNL Full palm up  Supination WNL Full palm down  (* = pain; Blank rows = not tested)  UE  MMT: MMT (out of 5) Right Left   Shoulder    Flexion 5   Extension    Abduction 5   External rotation 5   Internal rotation 5   Horizontal abduction    Horizontal adduction    Lower Trapezius    Rhomboids        Elbow  Flexion 5   Extension 5   Pronation 5   Supination 5       Wrist  Flexion 5 5  Extension 5 5  Radial deviation  5  Ulnar deviation  5      MCP  Flexion 5 5  Extension 5 5  Abduction 5 5  Adduction 5 5  (* = pain; Blank rows = not tested)  AROM AROM     Right Left  Shoulder    Flexion 180 160  Extension    Abduction 152 138  External Rotation 90 88  Internal Rotation 70 74  Hands Behind Head C6 C3  Hands Behind Back L1 Lateral waistline  (* = pain; Blank rows = not tested)  07/31/23 UE MMT: MMT (out of 5) Right Left   Shoulder   Flexion 5 4*  Abduction 5 4*  External rotation (seated) 5 4*  Internal rotation (seated) 4+ 4+*  Horizontal abduction Unable to lay prone Unable to lay prone  Horizontal adduction    Lower Trapezius    Rhomboids        Elbow  Flexion 5 5  Extension 5 5  Pronation 5 5  Supination 5 5*   Shoulder Flexion  Peak Force (lb) Right 21.09 lb Left 10.19 lb Strength Difference 10.90 lb* Percentage Difference 69.66%*  Shoulder Abduction Peak Force (lb) Right 14.57 lb Left 8.33 lb Strength Difference 6.24 lb* Percentage Difference 54.48%*  Shoulder External Rotation Peak Force (lb) Right 20.28 lb Left 16.90 lb Strength Difference 3.37 lb* Percentage Difference 18.15%*  Shoulder Internal Rotation Peak Force (lb) Right 19.77 lb Left 20.43 lb Strength Difference 0.66 lb Percentage Difference 3.28%  10/11/23: AROM AROM     Right Left  Shoulder    Flexion  156  Extension    Abduction/Scaption  84*  External Rotation  50*  Internal Rotation  54*  Hands Behind Head    Hands Behind Back    (* = pain; Blank rows = not tested)  UE MMT: MMT (out of 5) Right Left   Shoulder   Flexion  4*  Abduction  4*  External rotation (seated)  4*   Internal rotation (seated)  4+*  Horizontal abduction  Unable to lay prone  Horizontal adduction    Lower Trapezius    Rhomboids        Elbow  Flexion  5  Extension  5  Pronation  5  Supination  5    TODAY'S TREATMENT: 11/06/2023    Subjective:  Pt states that he his doing alright today. No resting L shoulder pain upon arrival. No excessive soreness after the last therapy session. No specific questions currently.   PAIN: No resting pain;   Ther-ex  In semi-recumbent position: L shoulder flexion with 3# dumbbell 2 x 10; L shoulder scaption, unweighted, 2x 10; L shoulder overhead press with 2# DB 2 x 10; L shoulder manually resisted ER 2 x 10; L shoulder manually resisted IR 2 x 10; L elbow curl with with manual resistance 2 x 10; L elbow manually resisted extension 2 x 10; Dowel chest press with manual resistance from  therapist 2 x 10; Dowel rows with manual resistance 2 x 10; Seated dowel overhead press x 10; Seated scapular retractions 2 x 10; Nautilus lat pull downs 80# 3 x 10; Nautilus tricep press downs 40# 3 x 10;   Manual Therapy  Extensive STM to L upper/mid/low trap, cervical paraspinals, and rhomboids in sitting;   Not performed: L shoulder isometric extension 3s hold 2 x 10; L shoulder isometric abduction 3s hold x 10; L shoulder isometric abduction and extension 2 x 10 each; Nautilus rows with hand grips 50# 3 x 10; Seated pulleys for L shoulder AAROM overhead flexion and abduction x multiple bouts each direction; Seated W's with green tband 2 x 10; Seated shoulder rolls 2 x 10; Seated horizontal abduction with blue tband 2 x 10; Seated L shoulder extension with blue tband 2 x 10;   HOME EXERCISE PROGRAM:  Access Code: [email or web address] URL: [email or web address] Date: 07/31/2023 Prepared by: Selinda Eck  Exercises - Seated Cervical Sidebending Stretch (Mirrored)  - 2 x daily - 7 x weekly - 3 reps - 30s hold - Seated Scapular  Retraction  - 2 x daily - 7 x weekly - 2 sets - 10 reps - 3s hold - Standing Shoulder Flexion Full Range Single Arm  - 1 x daily - 7 x weekly - 2 sets - 10 reps - 3s hold - Standing Shoulder Abduction Full Range Single Arm  - 1 x daily - 7 x weekly - 2 sets - 10 reps - 3s hold - Wrist Flexion AROM  - 2 x daily - 7 x weekly - 2 sets - 10 reps - 3s hold - Wrist Extension AROM  - 1 x daily - 7 x weekly - 2 sets - 10 reps - 3s hold - Shoulder External Rotation and Scapular Retraction with Resistance  - 1 x daily - 7 x weekly - 2 sets - 10 reps - 3s hold    ASSESSMENT: Continued strengthening with patient during session today. Progressed back to 3# dumbbells during strengthening today. He begins to sweat during session but radial pulse feels regular. Mr. Litsey would benefit from ongoing PT to prevent additional decline, regain L shoulder strength, and decrease pain. Pt encouraged to continue HEP and follow up as directed. He will continue to benefit from skilled therapy to address remaining deficits in L shoulder weakness and pain order to improve quality of life and return to PLOF.     OBJECTIVE IMPAIRMENTS: decreased ROM, decreased strength, and pain.   ACTIVITY LIMITATIONS: carrying, lifting, bathing, toileting, dressing, and reach over head  PARTICIPATION LIMITATIONS: meal prep, cleaning, laundry, driving, shopping, community activity, and yard work  PERSONAL FACTORS: Past/current experiences, Time since onset of injury/illness/exacerbation, and 3+ comorbidities: OA, anemia, anxiety, cardiomyopathy, chronic pain are also affecting patient's functional outcome.   REHAB POTENTIAL: Good  CLINICAL DECISION MAKING: Unstable/unpredictable  EVALUATION COMPLEXITY: High   GOALS: Goals reviewed with patient? Yes  SHORT TERM GOALS: Target date: 07/09/2023   Pt will be independent with HEP to improve strength and decrease shoulder pain to improve pain-free function at home and work. Baseline:  Issued and progressing Goal status: ONGOING   LONG TERM GOALS: Target date: 08/20/2023  1.  Pt will report no further L shoulder pain with all AROM in order to complete all ADLs and improve pain-free function. Baseline: 05/28/23: worst pain 7/10; 07/31/23: 10/10; 10/12/23: 8/10; Goal status: ONGOING  2.  Pt will decrease QuickDASH score to below 30% in order to demonstrate  clinically significant reduction in disability related to shoulder pain        Baseline: 02/26/23: 72.7%; 05/28/23: 40.9%; 07/31/23: 56.8%; 10/11/23: 59.1% Goal status: PARTIALLY MET  3. Pt will increase strength of pain-free L shoulder flexion and scaption to at least 4/5 in order to demonstrate improvement in strength and function         Baseline: 02/26/23: Unable to test; 05/28/23: flexion: 4+/5, pain free, scaption: 4/5, pain; 07/31/23: see note above, painful; 10/11/23:  flexion: 4/5 with pain, scaption: 4/5 with pain; Goal status: PARTIALLY MET  4. Pt will improve L shoulder AROM flexion and scaption to at least 110 degrees in order to demonstrate improvement in function so he can take care of his property and complete all household responsibilities;        Baseline: 02/26/23: Unable to test (90 degrees PROM for both), 05/28/23: L shoulder AROM against gravity: flexion: 176, no pain, scaption: 130, pain/burning; 07/31/23: see note; 10/12/23: flexion: 156 with pain, scaption: 84 with pain Goal status: PARTIALLY MET   PLAN: PT FREQUENCY: 1-2x/week  PT DURATION: 12 weeks  PLANNED INTERVENTIONS: Therapeutic exercises, Therapeutic activity, Neuromuscular re-education, Balance training, Gait training, Patient/Family education, Self Care, Joint mobilization, Joint manipulation, Vestibular training, Canalith repositioning, Orthotic/Fit training, DME instructions, Dry Needling, Electrical stimulation, Spinal manipulation, Spinal mobilization, Cryotherapy, Moist heat, Taping, Traction, Ultrasound, Ionotophoresis 4mg /ml Dexamethasone ,  Manual therapy, and Re-evaluation.  PLAN FOR NEXT SESSION: progress HEP, progress L shoulder ROM and strength per surgical protocol;  Selinda BIRCH Hersey Maclellan PT, DPT, GCS  3:02 PM,11/06/23

## 2023-11-06 ENCOUNTER — Ambulatory Visit

## 2023-11-06 DIAGNOSIS — M25512 Pain in left shoulder: Secondary | ICD-10-CM | POA: Diagnosis not present

## 2023-11-06 DIAGNOSIS — M6281 Muscle weakness (generalized): Secondary | ICD-10-CM

## 2023-11-06 DIAGNOSIS — G8929 Other chronic pain: Secondary | ICD-10-CM

## 2023-11-06 LAB — SURGICAL PATHOLOGY

## 2023-11-07 ENCOUNTER — Ambulatory Visit: Payer: Self-pay | Admitting: Gastroenterology

## 2023-11-07 NOTE — Progress Notes (Signed)
 Pathology results from sigmoidoscopy came back normal.  There is no evidence of inflammation.  Recommend to continue pancreatic enzymes.  Recommend empiric trial of rifaximin 550 mg 3 times daily for 2 weeks Dx: IBS diarrhea

## 2023-11-08 ENCOUNTER — Ambulatory Visit

## 2023-11-08 DIAGNOSIS — M6281 Muscle weakness (generalized): Secondary | ICD-10-CM

## 2023-11-08 DIAGNOSIS — G8929 Other chronic pain: Secondary | ICD-10-CM

## 2023-11-08 DIAGNOSIS — M25512 Pain in left shoulder: Secondary | ICD-10-CM | POA: Diagnosis not present

## 2023-11-08 NOTE — Therapy (Unsigned)
 OUTPATIENT PHYSICAL THERAPY SHOULDER TREATMENT  Patient Name: [name] MRN: [id number] DOB:July 16, 1955, 68 y.o., male Today's Date: 11/09/2023   PT End of Session - 11/09/23 1553     Visit Number 24    Number of Visits 49    Date for Recertification  01/03/24    Authorization Type eval: 02/26/23, [employer] Appeal Approved [id number] visits between 09/03/23-12/21/23  [facility] Medicare 2025  CO:Ajdzi on AUTH  Copay: $20  OOP:$3900/$0 used  [name] is req  Ref #:[id number]    Authorization - Visit Number 8    Authorization - Number of Visits 12    PT Start Time 1400    PT Stop Time 1445    PT Time Calculation (min) 45 min    Activity Tolerance Patient tolerated treatment well    Behavior During Therapy WFL for tasks assessed/performed          Past Medical History:  Diagnosis Date   (HFpEF) heart failure with preserved ejection fraction (HCC)    a.) TTE 12/26/2013: EF 45-50%, mild ant and antsept HK. mild MR. Mod dil LA. nl RV fxn. Rhythm was Afib; b.) TTE 07/04/2019: EF 55%, mid-apical anteroseptal HK, mild MAC, mild Ao sclerosis, G2DD, RVSP 45.3; c.) TTE 06/02/2022: EF 55-60%, no RWMAs, sev LAE, RVSP 37.9, mild-mod MR, mild AR, AoV sclerosis without stenosis, asc Ao 38 mm   Anxiety    Aortic atherosclerosis    Cardiomyopathy (in setting of Afib)    a.) TTE 12/26/2013: EF 45-50%; b.) TTE 07/04/2019: EF 55%; c.) TTE 06/02/2022: EF 55-60%   Chronic pain syndrome    a.) followed by pain management   Chronic, continuous use of opioids    a.) on COT; followed by pain management   Coronary artery disease 11/06/2014   a.) cCTA 11/06/2014: Ca2+ = 224 (74th %ile; LAD distribution)   Diverticulosis    DJD (degenerative joint disease) of knee    Former smoker    History of hiatal hernia    History of kidney stones 2012   Hyperlipidemia    Hyperplastic colon polyp    Hypertension    Inguinal hernia, left    Internal hemorrhoids    Intervertebral disc disorder with radiculopathy of lumbosacral  region    Lesion of bone of lumbosacral spine    L5   Long term current use of amiodarone     Lower extremity edema    Mixed hyperlipidemia    Morbid obesity (HCC)    Multiple falls    Myalgia due to statin    On rivaroxaban  therapy    Osteoarthritis    PAF (paroxysmal atrial fibrillation) (HCC)    a.) CHA2DS2VASc = 4 (age, HTN, CHF, vascular disease) as of 02/19/23;  b.) s/p DCCV 03/13/14 (200 J), 05/15/19 (150 J x 1, 200 J x2), 05/19/2022 (150 J x1, 200 J x2; pads changed to ant/lat postion with additional 200 J x 2 -> did not convert), 07/25/22 (200 J), 09/21/2022 (200 J); c.) s/p PVI ablation 11/17/2022; d.) rate/rhythm maintained on amiodarone ; chronically anticoagulated on rivaroxaban    Pernicious anemia    Prostate cancer (HCC)    Pulmonary nodule, right    a. 10/2014 Cardiac CTA: 7mm RLL nodule; b. 04/2015 CT Chest: stable 7mm RLL nodule. No new nodules; 02/2017 CTA Chest: stable, benign, 7mm RLL pulm nodule.   Rotator cuff tear, left    Sleep difficulties    a.) takes melatonin + trazodone  PRN   SVT (supraventricular tachycardia)    Tubular adenoma  of colon    Past Surgical History:  Procedure Laterality Date   ATRIAL FIBRILLATION ABLATION N/A 11/17/2022   Procedure: ATRIAL FIBRILLATION ABLATION; Location: [facility]; Surgeon: Gehi, Anil, MD   BICEPT TENODESIS Right 09/27/2021   Procedure: Right reverse shoulder arthroplasty, biceps tenodesis;  Surgeon: Tobie Priest, MD;  Location: [facility];  Service: Orthopedics;  Laterality: Right;   CARDIOVERSION N/A 05/15/2019   Procedure: CARDIOVERSION;  Surgeon: Perla Evalene PARAS, MD;  Location: [facility];  Service: Cardiovascular;  Laterality: N/A;   CARDIOVERSION N/A 03/13/2014   Procedure: CARDIOVERSION; Location: [facility]; Surgeon: Evalene Perla, MD   CARDIOVERSION N/A 03/24/2022   Procedure: CARDIOVERSION;  Surgeon: Perla Evalene PARAS, MD;  Location: [facility];  Service: Cardiovascular;  Laterality: N/A;   CARDIOVERSION N/A 05/19/2022    Procedure: CARDIOVERSION;  Surgeon: Perla Evalene PARAS, MD;  Location: [facility];  Service: Cardiovascular;  Laterality: N/A;   CARDIOVERSION N/A 07/25/2022   Procedure: CARDIOVERSION;  Surgeon: Fernande Elspeth BROCKS, MD;  Location: [facility];  Service: Cardiovascular;  Laterality: N/A;   CARDIOVERSION N/A 09/21/2022   Procedure: CARDIOVERSION;  Surgeon: Fernande Elspeth BROCKS, MD;  Location: [facility];  Service: Cardiovascular;  Laterality: N/A;   CARPAL TUNNEL RELEASE Right 12/23/2014   Procedure: CARPAL TUNNEL RELEASE;  Surgeon: Kayla Pinal, MD;  Location: [facility];  Service: Orthopedics;  Laterality: Right;   CARPAL TUNNEL RELEASE Left 01/06/2015   Procedure: CARPAL TUNNEL RELEASE;  Surgeon: Kayla Pinal, MD;  Location: [facility];  Service: Orthopedics;  Laterality: Left;   COLONOSCOPY WITH PROPOFOL  N/A 10/08/2017   Procedure: COLONOSCOPY WITH PROPOFOL ;  Surgeon: Unk Corinn Skiff, MD;  Location[facility];  Service: Gastroenterology;  Laterality: N/A;   COLONOSCOPY WITH PROPOFOL  N/A 06/07/2022   Procedure: COLONOSCOPY WITH PROPOFOL ;  Surgeon: Unk Corinn Skiff, MD;  Location: [facility]OSCOPY;  Service: Gastroenterology;  Laterality: N/A;   ENTEROSCOPY  06/07/2022   Procedure: ENTEROSCOPY;  Surgeon: Unk Corinn Skiff, MD;  Location: [facility] ENDOSCOPY;  Service: Gastroenterology;;   ESOPHAGOGASTRODUODENOSCOPY (EGD) WITH PROPOFOL  N/A 06/07/2022   Procedure: ESOPHAGOGASTRODUODENOSCOPY (EGD) WITH PROPOFOL ;  Surgeon: Unk Corinn Skiff, MD;  Location: [facility] ENDOSCOPY;  Service: Gastroenterology;  Laterality: N/A;   FLEXIBLE SIGMOIDOSCOPY N/A 11/02/2023   Procedure: KINGSTON SIDE;  Surgeon: Unk Corinn Skiff, MD;  Location: [facility]OSCOPY;  Service: Gastroenterology;  Laterality: N/A;   GASTRIC BYPASS  01/17/2000   KNEE SURGERY Right    knee trauma x3   prostate seeding     REVERSE SHOULDER ARTHROPLASTY Right 09/27/2021   Procedure: Right reverse shoulder arthroplasty, biceps tenodesis;   Surgeon: Tobie Priest, MD;  Location: [facility];  Service: Orthopedics;  Laterality: Right;   REVERSE SHOULDER ARTHROPLASTY Left 02/20/2023   Procedure: Left reverse shoulder arthroplasty, biceps tenodesis;  Surgeon: Tobie Priest, MD;  Location: [facility];  Service: Orthopedics;  Laterality: Left;   SHOULDER ARTHROSCOPY WITH ROTATOR CUFF REPAIR AND OPEN BICEPS TENODESIS Left 12/27/2021   Procedure: Left shoulder arthroscopic cuff repair (supraspinatus and subscapularis) with Regeneten Patch application;  Surgeon: Tobie Priest, MD;  Location: [facility];  Service: Orthopedics;  Laterality: Left;   Patient Active Problem List   Diagnosis Date Noted   Anticoagulated 09/14/2023   Acute diverticulitis 09/13/2023   Chronic diastolic CHF (congestive heart failure) (HCC) 09/13/2023   Symptomatic anemia 04/26/2022   Chronic pain of both knees 03/30/2022   Bilateral primary osteoarthritis of knee 03/30/2022   Shortness of breath 03/24/2022   Vitamin D  deficiency 02/24/2022   S/p reverse total shoulder arthroplasty 09/27/2021   Lesion  of bone of lumbosacral spine (L5) 05/12/2021   Localized osteoarthritis of shoulder regions, bilateral 03/29/2021   Chronic pain of both shoulders 03/29/2021   Drug-induced myopathy 02/21/2021   Trigger finger of right hand 11/15/2020   Prostate cancer (HCC) 07/26/2020   Insomnia 08/01/2019   Primary osteoarthritis of both wrists 02/17/2019   Trigger middle finger of left hand 02/17/2019   Chronic pain syndrome 02/17/2019   Chronic radicular lumbar pain 10/08/2018   Lumbar radiculopathy 10/08/2018   Lumbar degenerative disc disease 10/08/2018   Lumbar facet arthropathy 10/08/2018   Lumbar facet joint syndrome 10/08/2018   Intervertebral disc disorder with radiculopathy of lumbosacral region    Osteoarthritis of knee 11/30/2017   Osteoarthritis of wrist 11/30/2017   Pernicious anemia 05/22/2016   Paroxysmal atrial fibrillation 12/20/2015   Carpal tunnel syndrome  11/16/2014   DJD (degenerative joint disease) of knee 10/27/2014   Bilateral carpal tunnel syndrome 10/27/2014   Morbid obesity with BMI of 45.0-49.9, adult (HCC) 10/27/2014   Mixed hyperlipidemia 10/27/2014   H/O gastric bypass 03/20/2014   Hyperkalemia 03/20/2014   History of prostate cancer 12/26/2013   Essential hypertension 12/26/2013   Encounter for anticoagulation discussion and counseling 12/26/2013   BRBPR (bright red blood per rectum) 12/26/2013   PCP: Dr. Edman  REFERRING PROVIDER: Dr. Earnestine Blanch  REFERRING DIAG: [phone] (ICD-10-CM) - Complete rotator cuff tear or rupture of left shoulder, not specified as traumatic   RATIONALE FOR EVALUATION AND TREATMENT: Rehabilitation  THERAPY DIAG: Chronic left shoulder pain  Muscle weakness (generalized)  ONSET DATE: 02/20/23  FOLLOW-UP APPT SCHEDULED WITH REFERRING PROVIDER: Yes   FROM INITIAL EVALUATION SUBJECTIVE:                                                                                                                                                                                         SUBJECTIVE STATEMENT:  L reverse TSR 02/20/23  PERTINENT HISTORY:  MCDANIEL OHMS is a 68 y.o. male who initially underwent arthroscopic subscapularis repair and side-to-side supraspinatus repair with Regeneten patch application by Dr. Blanch 12/27/2021. He underwent post-surgical physical therapy with initial minimal improvement followed by worsening shoulder strength/function. Repeat MRI showed large, retracted subscapularis re-tear and supraspinatus re-tear with signs of rotator cuff arthropathy. Conservative measures including medications, physical therapy, and cortisone injections did not provide adequate relief. The patient elected to proceed with reverse shoulder arthroplasty and biceps tenodesis on 02/20/23. Intraoperative findings include retracted full-thickness subscapularis tear, partial-thickness supraspinatus tear, and biceps  tendinopathy. Surgery was a standard deltopectoral incision under general anesthesia along with an interscalene block. Per op note, operative arm to remain in sling at all  times except ROM exercises and hygiene. Can perform pendulums, elbow/wrist/hand ROM exercises. Passive ROM allowed to 90 FF and 30 ER. ASA 325mg  x 6 weeks for DVT ppx (pt states that he was actually instructed to start back on his Xarelto  and not to use ASA). Plan for PT starting on POD #3-4. Pt denies any post-operative complications from surgery. Pt has a history of chronic back pain and has undergone multiple lumbar facet, medial branch radiofrequency ablations. He also has a past history of cardiomyopathy (in setting of Afib), DJD (degenerative joint disease) of knee, kidney stones, lower extremity edema, PAF (paroxysmal atrial fibrillation), pernicious anemia, prostate cancer, and a pulmonary nodule. He also has a past surgical history that includes R reverse TSR, knee surgery, gastric bypass (2002), prostate seeding, carpal tunnel release (Right, 12/23/2014; Left, 01/06/2015), and multiple cardioversions.   PAIN:  Pain Intensity: No resting pain upon arrival Pain location: L shoulder Pain Quality: Nerve pain Radiating: No  Numbness/Tingling: No Focal Weakness: Yes 24-hour pain behavior: Varies History of prior shoulder or neck/shoulder injury, pain, surgery, or therapy: Yes PT for L shoulder s/p RTC repair, history of R reverse TSR; Dominant hand: right Red flags Positive: history of prostate CA, Negative: chills/fever, night sweats, nausea, vomiting, unrelenting pain, unexplained weight gain/loss  PRECAUTIONS: Shoulder, per operative note: operative arm to remain in sling at all times except ROM exercises and hygiene. Can perform pendulums, elbow/wrist/hand ROM exercises. Passive ROM allowed to 90 FF and 30 ER.   General reverse TSR precautions include no combined shoulder extension, IR, and adduction. No shoulder and elbow  AROM (bicep tenodesis) for at least the first 4 weeks   WEIGHT BEARING RESTRICTIONS: Yes no WB through LUE  FALLS: Has patient fallen in last 6 months? 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  Recent memory is intact.  Remote memory is intact.  Attention span and concentration are intact.  Expressive speech is intact.  Patient's fund of knowledge is within normal limits for educational level.    Gross Musculoskeletal Assessment Tremor: None Bulk: Normal Tone: Normal Incision is clean and dry with minimal blood staining on honeycomb dressing as well as minimal swelling around the incision. Skin is cool to touch and no signs of infection present.   Gait Deferred full gait assessment  Posture Forward head  with upper thoracic kyphosis and rounded shoulders  Cervical Screen Deferred  ROM ROM (Normal range in degrees)    Right (AROM) Left (PROM)  Shoulder    Flexion 160 90 (limited per protocol)  Extension    Abduction 180 90 (scaption)  External Rotation 88 0  Internal Rotation 70 stomach  Hands Behind Head    Hands Behind Back        Elbow    Flexion Full Full  Extension 0 0  Pronation WNL Full palm up  Supination WNL Full palm down  (* = pain; Blank rows = not tested)  UE MMT: MMT (out of 5) Right Left   Shoulder    Flexion 5   Extension    Abduction 5   External rotation 5   Internal rotation 5   Horizontal abduction    Horizontal adduction    Lower Trapezius    Rhomboids        Elbow  Flexion 5   Extension 5   Pronation 5   Supination 5       Wrist  Flexion 5 5  Extension 5 5  Radial deviation  5  Ulnar deviation  5      MCP  Flexion 5 5  Extension 5 5  Abduction 5 5  Adduction 5 5  (* = pain; Blank rows = not tested)  AROM AROM     Right Left  Shoulder    Flexion 180 160  Extension    Abduction 152 138  External Rotation 90 88  Internal Rotation 70 74  Hands Behind Head C6 C3  Hands Behind Back L1 Lateral waistline  (* = pain; Blank rows = not tested)  07/31/23 UE MMT: MMT (out of 5) Right Left   Shoulder   Flexion 5 4*  Abduction 5 4*  External rotation (seated) 5 4*  Internal rotation (seated) 4+ 4+*  Horizontal abduction Unable to lay prone Unable to lay prone  Horizontal adduction    Lower Trapezius    Rhomboids        Elbow  Flexion 5 5  Extension 5 5  Pronation 5 5  Supination 5 5*   Shoulder Flexion  Peak Force (lb) Right 21.09 lb Left 10.19 lb Strength Difference 10.90 lb* Percentage Difference 69.66%*  Shoulder Abduction Peak Force (lb) Right 14.57 lb Left 8.33 lb Strength Difference 6.24 lb* Percentage Difference 54.48%*  Shoulder External Rotation Peak Force (lb) Right 20.28 lb Left 16.90 lb Strength Difference 3.37 lb* Percentage Difference 18.15%*  Shoulder Internal Rotation Peak Force (lb) Right 19.77 lb Left 20.43 lb Strength Difference 0.66 lb Percentage Difference 3.28%  10/11/23: AROM AROM     Right Left  Shoulder    Flexion  156  Extension    Abduction/Scaption  84*  External Rotation  50*  Internal Rotation  54*  Hands Behind Head    Hands Behind Back    (* = pain; Blank rows = not tested)  UE MMT: MMT (out of 5) Right Left   Shoulder   Flexion  4*  Abduction  4*  External rotation (seated)  4*   Internal rotation (seated)  4+*  Horizontal abduction  Unable to lay prone  Horizontal adduction    Lower Trapezius    Rhomboids        Elbow  Flexion  5  Extension  5  Pronation  5  Supination  5    TODAY'S TREATMENT: 11/08/2023    Subjective:  Pt states that he his doing alright today. No resting L shoulder pain upon arrival. No excessive soreness after the last therapy session. No specific questions currently.   PAIN: No resting pain;   Ther-ex  In semi-recumbent position: L shoulder flexion with 3# dumbbell 2 x 10; L shoulder scaption, unweighted, 2x 10; L shoulder overhead press with 2# DB 2 x 10; L shoulder manually resisted ER 2 x 10; L shoulder manually resisted IR 2 x 10; L elbow curl with with manual resistance 2 x 10; L elbow manually resisted extension 2 x 10; Dowel chest press with manual resistance  from therapist 2 x 10; Dowel rows with manual resistance 2 x 10; Seated dowel overhead press x 10; Seated scapular retractions 2 x 10; Nautilus lat pull downs 80# 3 x 10; Nautilus tricep press downs 40# 3 x 10;   Manual Therapy  Extensive STM to L upper/mid/low trap, cervical paraspinals, and rhomboids in sitting;   Not performed: L shoulder isometric extension 3s hold 2 x 10; L shoulder isometric abduction 3s hold x 10; L shoulder isometric abduction and extension 2 x 10 each; Nautilus rows with hand grips 50# 3 x 10; Seated pulleys for L shoulder AAROM overhead flexion and abduction x multiple bouts each direction; Seated W's with green tband 2 x 10; Seated shoulder rolls 2 x 10; Seated horizontal abduction with blue tband 2 x 10; Seated L shoulder extension with blue tband 2 x 10;   HOME EXERCISE PROGRAM:  Access Code: [email or web address] URL: [email or web address] Date: 07/31/2023 Prepared by: Selinda Eck  Exercises - Seated Cervical Sidebending Stretch (Mirrored)  - 2 x daily - 7 x weekly - 3 reps - 30s hold - Seated Scapular  Retraction  - 2 x daily - 7 x weekly - 2 sets - 10 reps - 3s hold - Standing Shoulder Flexion Full Range Single Arm  - 1 x daily - 7 x weekly - 2 sets - 10 reps - 3s hold - Standing Shoulder Abduction Full Range Single Arm  - 1 x daily - 7 x weekly - 2 sets - 10 reps - 3s hold - Wrist Flexion AROM  - 2 x daily - 7 x weekly - 2 sets - 10 reps - 3s hold - Wrist Extension AROM  - 1 x daily - 7 x weekly - 2 sets - 10 reps - 3s hold - Shoulder External Rotation and Scapular Retraction with Resistance  - 1 x daily - 7 x weekly - 2 sets - 10 reps - 3s hold    ASSESSMENT: Continued strengthening with patient during session today. Continued with 3# dumbbells during strengthening today. His range of motion, strength, and muscular endurance appear to all be improving. Plan to progress strengthening at future sessions as pain allows. Mr. Lottman would benefit from ongoing PT to prevent additional decline, regain L shoulder strength, and decrease pain. Pt encouraged to continue HEP and follow up as directed. He will continue to benefit from skilled therapy to address remaining deficits in L shoulder weakness and pain order to improve quality of life and return to PLOF.     OBJECTIVE IMPAIRMENTS: decreased ROM, decreased strength, and pain.   ACTIVITY LIMITATIONS: carrying, lifting, bathing, toileting, dressing, and reach over head  PARTICIPATION LIMITATIONS: meal prep, cleaning, laundry, driving, shopping, community activity, and yard work  PERSONAL FACTORS: Past/current experiences, Time since onset of injury/illness/exacerbation, and 3+ comorbidities: OA, anemia, anxiety, cardiomyopathy, chronic pain are also affecting patient's functional outcome.   REHAB POTENTIAL: Good  CLINICAL DECISION MAKING: Unstable/unpredictable  EVALUATION COMPLEXITY: High   GOALS: Goals reviewed with patient? Yes  SHORT TERM GOALS: Target date: 07/09/2023   Pt will be independent with HEP to improve strength and decrease  shoulder pain to improve pain-free function at home and work. Baseline: Issued and progressing Goal status: ONGOING   LONG TERM GOALS: Target date: 08/20/2023  1.  Pt will report no further L shoulder pain with all AROM in order to complete all ADLs and improve pain-free function. Baseline: 05/28/23: worst pain 7/10; 07/31/23: 10/10; 10/12/23: 8/10; Goal status: ONGOING  2.  Pt will decrease QuickDASH score to below 30% in order to demonstrate clinically significant reduction in disability related to shoulder pain        Baseline: 02/26/23: 72.7%; 05/28/23: 40.9%; 07/31/23: 56.8%; 10/11/23: 59.1% Goal status: PARTIALLY MET  3. Pt will increase strength of pain-free L shoulder flexion and scaption to at least 4/5 in order to demonstrate improvement in strength and function         Baseline: 02/26/23: Unable to test; 05/28/23: flexion: 4+/5, pain free, scaption: 4/5, pain; 07/31/23: see note above, painful; 10/11/23:  flexion: 4/5 with pain, scaption: 4/5 with pain; Goal status: PARTIALLY MET  4. Pt will improve L shoulder AROM flexion and scaption to at least 110 degrees in order to demonstrate improvement in function so he can take care of his property and complete all household responsibilities;        Baseline: 02/26/23: Unable to test (90 degrees PROM for both), 05/28/23: L shoulder AROM against gravity: flexion: 176, no pain, scaption: 130, pain/burning; 07/31/23: see note; 10/12/23: flexion: 156 with pain, scaption: 84 with pain Goal status: PARTIALLY MET   PLAN: PT FREQUENCY: 1-2x/week  PT DURATION: 12 weeks  PLANNED INTERVENTIONS: Therapeutic exercises, Therapeutic activity, Neuromuscular re-education, Balance training, Gait training, Patient/Family education, Self Care, Joint mobilization, Joint manipulation, Vestibular training, Canalith repositioning, Orthotic/Fit training, DME instructions, Dry Needling, Electrical stimulation, Spinal manipulation, Spinal mobilization, Cryotherapy, Moist heat,  Taping, Traction, Ultrasound, Ionotophoresis 4mg /ml Dexamethasone , Manual therapy, and Re-evaluation.  PLAN FOR NEXT SESSION: progress HEP, progress L shoulder ROM and strength per surgical protocol;  Selinda BIRCH Toron Bowring PT, DPT, GCS  3:55 PM,11/09/23

## 2023-11-09 NOTE — Therapy (Signed)
 OUTPATIENT PHYSICAL THERAPY SHOULDER TREATMENT  Patient Name: [name] MRN: [id number] DOB:March 04, 1955, 68 y.o., male Today's Date: 11/12/2023   PT End of Session - 11/12/23 0805     Visit Number 25    Number of Visits 49    Date for Recertification  01/03/24    Authorization Type eval: 02/26/23, [facility] Appeal Approved [id number] visits between 09/03/23-12/21/23  [facility] Medicare 2025  CO:Ajdzi on AUTH  Copay: $20  OOP:$3900/$0 used  [name] is req  Ref #:[id number]    Authorization - Visit Number 9    Authorization - Number of Visits 12    PT Start Time 0800    PT Stop Time 0845    PT Time Calculation (min) 45 min    Activity Tolerance Patient tolerated treatment well    Behavior During Therapy WFL for tasks assessed/performed          Past Medical History:  Diagnosis Date   (HFpEF) heart failure with preserved ejection fraction (HCC)    a.) TTE 12/26/2013: EF 45-50%, mild ant and antsept HK. mild MR. Mod dil LA. nl RV fxn. Rhythm was Afib; b.) TTE 07/04/2019: EF 55%, mid-apical anteroseptal HK, mild MAC, mild Ao sclerosis, G2DD, RVSP 45.3; c.) TTE 06/02/2022: EF 55-60%, no RWMAs, sev LAE, RVSP 37.9, mild-mod MR, mild AR, AoV sclerosis without stenosis, asc Ao 38 mm   Anxiety    Aortic atherosclerosis    Cardiomyopathy (in setting of Afib)    a.) TTE 12/26/2013: EF 45-50%; b.) TTE 07/04/2019: EF 55%; c.) TTE 06/02/2022: EF 55-60%   Chronic pain syndrome    a.) followed by pain management   Chronic, continuous use of opioids    a.) on COT; followed by pain management   Coronary artery disease 11/06/2014   a.) cCTA 11/06/2014: Ca2+ = 224 (74th %ile; LAD distribution)   Diverticulosis    DJD (degenerative joint disease) of knee    Former smoker    History of hiatal hernia    History of kidney stones 2012   Hyperlipidemia    Hyperplastic colon polyp    Hypertension    Inguinal hernia, left    Internal hemorrhoids    Intervertebral disc disorder with radiculopathy of lumbosacral  region    Lesion of bone of lumbosacral spine    L5   Long term current use of amiodarone     Lower extremity edema    Mixed hyperlipidemia    Morbid obesity (HCC)    Multiple falls    Myalgia due to statin    On rivaroxaban  therapy    Osteoarthritis    PAF (paroxysmal atrial fibrillation) (HCC)    a.) CHA2DS2VASc = 4 (age, HTN, CHF, vascular disease) as of 02/19/23;  b.) s/p DCCV 03/13/14 (200 J), 05/15/19 (150 J x 1, 200 J x2), 05/19/2022 (150 J x1, 200 J x2; pads changed to ant/lat postion with additional 200 J x 2 -> did not convert), 07/25/22 (200 J), 09/21/2022 (200 J); c.) s/p PVI ablation 11/17/2022; d.) rate/rhythm maintained on amiodarone ; chronically anticoagulated on rivaroxaban    Pernicious anemia    Prostate cancer (HCC)    Pulmonary nodule, right    a. 10/2014 Cardiac CTA: 7mm RLL nodule; b. 04/2015 CT Chest: stable 7mm RLL nodule. No new nodules; 02/2017 CTA Chest: stable, benign, 7mm RLL pulm nodule.   Rotator cuff tear, left    Sleep difficulties    a.) takes melatonin + trazodone  PRN   SVT (supraventricular tachycardia)    Tubular adenoma  of colon    Past Surgical History:  Procedure Laterality Date   ATRIAL FIBRILLATION ABLATION N/A 11/17/2022   Procedure: ATRIAL FIBRILLATION ABLATION; Location: [facility]; Surgeon: Gehi, Anil, MD   BICEPT TENODESIS Right 09/27/2021   Procedure: Right reverse shoulder arthroplasty, biceps tenodesis;  Surgeon: Tobie Priest, MD;  Location: [facility];  Service: Orthopedics;  Laterality: Right;   CARDIOVERSION N/A 05/15/2019   Procedure: CARDIOVERSION;  Surgeon: Perla Evalene PARAS, MD;  Location: [facility];  Service: Cardiovascular;  Laterality: N/A;   CARDIOVERSION N/A 03/13/2014   Procedure: CARDIOVERSION; Location: [facility]; Surgeon: Evalene Perla, MD   CARDIOVERSION N/A 03/24/2022   Procedure: CARDIOVERSION;  Surgeon: Perla Evalene PARAS, MD;  Location: [facility];  Service: Cardiovascular;  Laterality: N/A;   CARDIOVERSION N/A 05/19/2022    Procedure: CARDIOVERSION;  Surgeon: Perla Evalene PARAS, MD;  Location: [facility];  Service: Cardiovascular;  Laterality: N/A;   CARDIOVERSION N/A 07/25/2022   Procedure: CARDIOVERSION;  Surgeon: Fernande Elspeth BROCKS, MD;  Location: [facility];  Service: Cardiovascular;  Laterality: N/A;   CARDIOVERSION N/A 09/21/2022   Procedure: CARDIOVERSION;  Surgeon: Fernande Elspeth BROCKS, MD;  Location: [facility];  Service: Cardiovascular;  Laterality: N/A;   CARPAL TUNNEL RELEASE Right 12/23/2014   Procedure: CARPAL TUNNEL RELEASE;  Surgeon: Kayla Pinal, MD;  Location: [facility];  Service: Orthopedics;  Laterality: Right;   CARPAL TUNNEL RELEASE Left 01/06/2015   Procedure: CARPAL TUNNEL RELEASE;  Surgeon: Kayla Pinal, MD;  Location: [facility];  Service: Orthopedics;  Laterality: Left;   COLONOSCOPY WITH PROPOFOL  N/A 10/08/2017   Procedure: COLONOSCOPY WITH PROPOFOL ;  Surgeon: Unk Corinn Skiff, MD;  Location: [facility];  Service: Gastroenterology;  Laterality: N/A;   COLONOSCOPY WITH PROPOFOL  N/A 06/07/2022   Procedure: COLONOSCOPY WITH PROPOFOL ;  Surgeon: Unk Corinn Skiff, MD;  Location: [facility] ENDOSCOPY;  Service: Gastroenterology;  Laterality: N/A;   ENTEROSCOPY  06/07/2022   Procedure: ENTEROSCOPY;  Surgeon: Unk Corinn Skiff, MD;  Location: [facility];  Service: Gastroenterology;;   ESOPHAGOGASTRODUODENOSCOPY (EGD) WITH PROPOFOL  N/A 06/07/2022   Procedure: ESOPHAGOGASTRODUODENOSCOPY (EGD) WITH PROPOFOL ;  Surgeon: Unk Corinn Skiff, MD;  Location: [facility] ENDOSCOPY;  Service: Gastroenterology;  Laterality: N/A;   FLEXIBLE SIGMOIDOSCOPY N/A 11/02/2023   Procedure: KINGSTON SIDE;  Surgeon: Unk Corinn Skiff, MD;  Location: [facility] ENDOSCOPY;  Service: Gastroenterology;  Laterality: N/A;   GASTRIC BYPASS  01/17/2000   KNEE SURGERY Right    knee trauma x3   prostate seeding     REVERSE SHOULDER ARTHROPLASTY Right 09/27/2021   Procedure: Right reverse shoulder arthroplasty, biceps tenodesis;   Surgeon: Tobie Priest, MD;  Location: [facility];  Service: Orthopedics;  Laterality: Right;   REVERSE SHOULDER ARTHROPLASTY Left 02/20/2023   Procedure: Left reverse shoulder arthroplasty, biceps tenodesis;  Surgeon: Tobie Priest, MD;  Location: [facility];  Service: Orthopedics;  Laterality: Left;   SHOULDER ARTHROSCOPY WITH ROTATOR CUFF REPAIR AND OPEN BICEPS TENODESIS Left 12/27/2021   Procedure: Left shoulder arthroscopic cuff repair (supraspinatus and subscapularis) with Regeneten Patch application;  Surgeon: Tobie Priest, MD;  Location: [facility];  Service: Orthopedics;  Laterality: Left;   Patient Active Problem List   Diagnosis Date Noted   Anticoagulated 09/14/2023   Acute diverticulitis 09/13/2023   Chronic diastolic CHF (congestive heart failure) (HCC) 09/13/2023   Symptomatic anemia 04/26/2022   Chronic pain of both knees 03/30/2022   Bilateral primary osteoarthritis of knee 03/30/2022   Shortness of breath 03/24/2022   Vitamin D  deficiency 02/24/2022   S/p reverse total shoulder arthroplasty 09/27/2021   Lesion  of bone of lumbosacral spine (L5) 05/12/2021   Localized osteoarthritis of shoulder regions, bilateral 03/29/2021   Chronic pain of both shoulders 03/29/2021   Drug-induced myopathy 02/21/2021   Trigger finger of right hand 11/15/2020   Prostate cancer (HCC) 07/26/2020   Insomnia 08/01/2019   Primary osteoarthritis of both wrists 02/17/2019   Trigger middle finger of left hand 02/17/2019   Chronic pain syndrome 02/17/2019   Chronic radicular lumbar pain 10/08/2018   Lumbar radiculopathy 10/08/2018   Lumbar degenerative disc disease 10/08/2018   Lumbar facet arthropathy 10/08/2018   Lumbar facet joint syndrome 10/08/2018   Intervertebral disc disorder with radiculopathy of lumbosacral region    Osteoarthritis of knee 11/30/2017   Osteoarthritis of wrist 11/30/2017   Pernicious anemia 05/22/2016   Paroxysmal atrial fibrillation 12/20/2015   Carpal tunnel syndrome  11/16/2014   DJD (degenerative joint disease) of knee 10/27/2014   Bilateral carpal tunnel syndrome 10/27/2014   Morbid obesity with BMI of 45.0-49.9, adult (HCC) 10/27/2014   Mixed hyperlipidemia 10/27/2014   H/O gastric bypass 03/20/2014   Hyperkalemia 03/20/2014   History of prostate cancer 12/26/2013   Essential hypertension 12/26/2013   Encounter for anticoagulation discussion and counseling 12/26/2013   BRBPR (bright red blood per rectum) 12/26/2013   PCP: Dr. Edman  REFERRING PROVIDER: Dr. Earnestine Blanch  REFERRING DIAG: [phone] (ICD-10-CM) - Complete rotator cuff tear or rupture of left shoulder, not specified as traumatic   RATIONALE FOR EVALUATION AND TREATMENT: Rehabilitation  THERAPY DIAG: Chronic left shoulder pain  Muscle weakness (generalized)  ONSET DATE: 02/20/23  FOLLOW-UP APPT SCHEDULED WITH REFERRING PROVIDER: Yes   FROM INITIAL EVALUATION SUBJECTIVE:                                                                                                                                                                                         SUBJECTIVE STATEMENT:  L reverse TSR 02/20/23  PERTINENT HISTORY:  [name] is a 68 y.o. male who initially underwent arthroscopic subscapularis repair and side-to-side supraspinatus repair with Regeneten patch application by Dr. Blanch 12/27/2021. He underwent post-surgical physical therapy with initial minimal improvement followed by worsening shoulder strength/function. Repeat MRI showed large, retracted subscapularis re-tear and supraspinatus re-tear with signs of rotator cuff arthropathy. Conservative measures including medications, physical therapy, and cortisone injections did not provide adequate relief. The patient elected to proceed with reverse shoulder arthroplasty and biceps tenodesis on 02/20/23. Intraoperative findings include retracted full-thickness subscapularis tear, partial-thickness supraspinatus tear, and biceps  tendinopathy. Surgery was a standard deltopectoral incision under general anesthesia along with an interscalene block. Per op note, operative arm to remain in sling at all  times except ROM exercises and hygiene. Can perform pendulums, elbow/wrist/hand ROM exercises. Passive ROM allowed to 90 FF and 30 ER. ASA 325mg  x 6 weeks for DVT ppx (pt states that he was actually instructed to start back on his Xarelto  and not to use ASA). Plan for PT starting on POD #3-4. Pt denies any post-operative complications from surgery. Pt has a history of chronic back pain and has undergone multiple lumbar facet, medial branch radiofrequency ablations. He also has a past history of cardiomyopathy (in setting of Afib), DJD (degenerative joint disease) of knee, kidney stones, lower extremity edema, PAF (paroxysmal atrial fibrillation), pernicious anemia, prostate cancer, and a pulmonary nodule. He also has a past surgical history that includes R reverse TSR, knee surgery, gastric bypass (2002), prostate seeding, carpal tunnel release (Right, 12/23/2014; Left, 01/06/2015), and multiple cardioversions.   PAIN:  Pain Intensity: No resting pain upon arrival Pain location: L shoulder Pain Quality: Nerve pain Radiating: No  Numbness/Tingling: No Focal Weakness: Yes 24-hour pain behavior: Varies History of prior shoulder or neck/shoulder injury, pain, surgery, or therapy: Yes PT for L shoulder s/p RTC repair, history of R reverse TSR; Dominant hand: right Red flags Positive: history of prostate CA, Negative: chills/fever, night sweats, nausea, vomiting, unrelenting pain, unexplained weight gain/loss  PRECAUTIONS: Shoulder, per operative note: operative arm to remain in sling at all times except ROM exercises and hygiene. Can perform pendulums, elbow/wrist/hand ROM exercises. Passive ROM allowed to 90 FF and 30 ER.   General reverse TSR precautions include no combined shoulder extension, IR, and adduction. No shoulder and elbow  AROM (bicep tenodesis) for at least the first 4 weeks   WEIGHT BEARING RESTRICTIONS: Yes no WB through LUE  FALLS: Has patient fallen in last 6 months? 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  Recent memory is intact.  Remote memory is intact.  Attention span and concentration are intact.  Expressive speech is intact.  Patient's fund of knowledge is within normal limits for educational level.    Gross Musculoskeletal Assessment Tremor: None Bulk: Normal Tone: Normal Incision is clean and dry with minimal blood staining on honeycomb dressing as well as minimal swelling around the incision. Skin is cool to touch and no signs of infection present.   Gait Deferred full gait assessment  Posture Forward head  with upper thoracic kyphosis and rounded shoulders  Cervical Screen Deferred  ROM ROM (Normal range in degrees)    Right (AROM) Left (PROM)  Shoulder    Flexion 160 90 (limited per protocol)  Extension    Abduction 180 90 (scaption)  External Rotation 88 0  Internal Rotation 70 stomach  Hands Behind Head    Hands Behind Back        Elbow    Flexion Full Full  Extension 0 0  Pronation WNL Full palm up  Supination WNL Full palm down  (* = pain; Blank rows = not tested)  UE MMT: MMT (out of 5) Right Left   Shoulder    Flexion 5   Extension    Abduction 5   External rotation 5   Internal rotation 5   Horizontal abduction    Horizontal adduction    Lower Trapezius    Rhomboids        Elbow  Flexion 5   Extension 5   Pronation 5   Supination 5       Wrist  Flexion 5 5  Extension 5 5  Radial deviation  5  Ulnar deviation  5      MCP  Flexion 5 5  Extension 5 5  Abduction 5 5  Adduction 5 5  (* = pain; Blank rows = not tested)  AROM AROM     Right Left  Shoulder    Flexion 180 160  Extension    Abduction 152 138  External Rotation 90 88  Internal Rotation 70 74  Hands Behind Head C6 C3  Hands Behind Back L1 Lateral waistline  (* = pain; Blank rows = not tested)  07/31/23 UE MMT: MMT (out of 5) Right Left   Shoulder   Flexion 5 4*  Abduction 5 4*  External rotation (seated) 5 4*  Internal rotation (seated) 4+ 4+*  Horizontal abduction Unable to lay prone Unable to lay prone  Horizontal adduction    Lower Trapezius    Rhomboids        Elbow  Flexion 5 5  Extension 5 5  Pronation 5 5  Supination 5 5*   Shoulder Flexion  Peak Force (lb) Right 21.09 lb Left 10.19 lb Strength Difference 10.90 lb* Percentage Difference 69.66%*  Shoulder Abduction Peak Force (lb) Right 14.57 lb Left 8.33 lb Strength Difference 6.24 lb* Percentage Difference 54.48%*  Shoulder External Rotation Peak Force (lb) Right 20.28 lb Left 16.90 lb Strength Difference 3.37 lb* Percentage Difference 18.15%*  Shoulder Internal Rotation Peak Force (lb) Right 19.77 lb Left 20.43 lb Strength Difference 0.66 lb Percentage Difference 3.28%  10/11/23: AROM AROM     Right Left  Shoulder    Flexion  156  Extension    Abduction/Scaption  84*  External Rotation  50*  Internal Rotation  54*  Hands Behind Head    Hands Behind Back    (* = pain; Blank rows = not tested)  UE MMT: MMT (out of 5) Right Left   Shoulder   Flexion  4*  Abduction  4*  External rotation (seated)  4*   Internal rotation (seated)  4+*  Horizontal abduction  Unable to lay prone  Horizontal adduction    Lower Trapezius    Rhomboids        Elbow  Flexion  5  Extension  5  Pronation  5  Supination  5    TODAY'S TREATMENT: 11/12/2023    Subjective:  Pt states that he his doing alright today. No resting L shoulder pain upon arrival. Ongoing L shoulder soreness, especially at night recently. No specific questions currently.   PAIN: No resting pain;   Ther-ex  In semi-recumbent position: L shoulder flexion with 3# dumbbell 2 x 10; L shoulder scaption, unweighted, 2x 10; L shoulder overhead press with 2# DB 2 x 10; L shoulder manually resisted ER 2 x 10; L shoulder manually resisted IR 2 x 10; L elbow curl with with manual resistance 2 x 10; L elbow manually resisted extension 2 x 10; Dowel chest press with manual resistance  from therapist 2 x 10; Dowel rows with manual resistance 2 x 10; Seated dowel forward flexion AROM x 10; Nautilus lat pull downs 80# 3 x 10; Nautilus rows 30# x 10, 40# 2 x 10; Seated shoulder rolls 2 x 10;   Manual Therapy  Extensive STM to L upper/mid/low trap, cervical paraspinals, and rhomboids in sitting;   Not performed: L shoulder isometric extension 3s hold 2 x 10; L shoulder isometric abduction 3s hold x 10; L shoulder isometric abduction and extension 2 x 10 each; Nautilus rows with hand grips 50# 3 x 10; Seated pulleys for L shoulder AAROM overhead flexion and abduction x multiple bouts each direction; Seated W's with green tband 2 x 10; Seated shoulder rolls 2 x 10; Seated horizontal abduction with blue tband 2 x 10; Seated L shoulder extension with blue tband 2 x 10;   HOME EXERCISE PROGRAM:  Access Code: [email or web address] URL: [email or web address] Date: 07/31/2023 Prepared by: Selinda Eck  Exercises - Seated Cervical Sidebending Stretch (Mirrored)  - 2 x daily - 7 x weekly - 3 reps - 30s hold - Seated Scapular  Retraction  - 2 x daily - 7 x weekly - 2 sets - 10 reps - 3s hold - Standing Shoulder Flexion Full Range Single Arm  - 1 x daily - 7 x weekly - 2 sets - 10 reps - 3s hold - Standing Shoulder Abduction Full Range Single Arm  - 1 x daily - 7 x weekly - 2 sets - 10 reps - 3s hold - Wrist Flexion AROM  - 2 x daily - 7 x weekly - 2 sets - 10 reps - 3s hold - Wrist Extension AROM  - 1 x daily - 7 x weekly - 2 sets - 10 reps - 3s hold - Shoulder External Rotation and Scapular Retraction with Resistance  - 1 x daily - 7 x weekly - 2 sets - 10 reps - 3s hold    ASSESSMENT: Continued strengthening with patient during session today. Continued with 3# dumbbells during strengthening today. His range of motion, strength, and muscular endurance appear to all be improving. Plan to progress strengthening at future sessions as pain allows. He will need a progress note at next session. [name] would benefit from ongoing PT to prevent additional decline, regain L shoulder strength, and decrease pain. Pt encouraged to continue HEP and follow up as directed. He will continue to benefit from skilled therapy to address remaining deficits in L shoulder weakness and pain order to improve quality of life and return to PLOF.     OBJECTIVE IMPAIRMENTS: decreased ROM, decreased strength, and pain.   ACTIVITY LIMITATIONS: carrying, lifting, bathing, toileting, dressing, and reach over head  PARTICIPATION LIMITATIONS: meal prep, cleaning, laundry, driving, shopping, community activity, and yard work  PERSONAL FACTORS: Past/current experiences, Time since onset of injury/illness/exacerbation, and 3+ comorbidities: OA, anemia, anxiety, cardiomyopathy, chronic pain are also affecting patient's functional outcome.   REHAB POTENTIAL: Good  CLINICAL DECISION MAKING: Unstable/unpredictable  EVALUATION COMPLEXITY: High   GOALS: Goals reviewed with patient? Yes  SHORT TERM GOALS: Target date: 07/09/2023   Pt will be  independent with HEP to improve strength and decrease shoulder pain to improve pain-free function at home and work. Baseline: Issued and progressing Goal status: ONGOING   LONG TERM GOALS: Target date: 08/20/2023  1.  Pt will report no further L shoulder pain with all AROM in order to complete all ADLs and improve pain-free function. Baseline: 05/28/23: worst pain  7/10; 07/31/23: 10/10; 10/12/23: 8/10; Goal status: ONGOING  2.  Pt will decrease QuickDASH score to below 30% in order to demonstrate clinically significant reduction in disability related to shoulder pain        Baseline: 02/26/23: 72.7%; 05/28/23: 40.9%; 07/31/23: 56.8%; 10/11/23: 59.1% Goal status: PARTIALLY MET  3. Pt will increase strength of pain-free L shoulder flexion and scaption to at least 4/5 in order to demonstrate improvement in strength and function         Baseline: 02/26/23: Unable to test; 05/28/23: flexion: 4+/5, pain free, scaption: 4/5, pain; 07/31/23: see note above, painful; 10/11/23:  flexion: 4/5 with pain, scaption: 4/5 with pain; Goal status: PARTIALLY MET  4. Pt will improve L shoulder AROM flexion and scaption to at least 110 degrees in order to demonstrate improvement in function so he can take care of his property and complete all household responsibilities;        Baseline: 02/26/23: Unable to test (90 degrees PROM for both), 05/28/23: L shoulder AROM against gravity: flexion: 176, no pain, scaption: 130, pain/burning; 07/31/23: see note; 10/12/23: flexion: 156 with pain, scaption: 84 with pain Goal status: PARTIALLY MET   PLAN: PT FREQUENCY: 1-2x/week  PT DURATION: 12 weeks  PLANNED INTERVENTIONS: Therapeutic exercises, Therapeutic activity, Neuromuscular re-education, Balance training, Gait training, Patient/Family education, Self Care, Joint mobilization, Joint manipulation, Vestibular training, Canalith repositioning, Orthotic/Fit training, DME instructions, Dry Needling, Electrical stimulation, Spinal  manipulation, Spinal mobilization, Cryotherapy, Moist heat, Taping, Traction, Ultrasound, Ionotophoresis 4mg /ml Dexamethasone , Manual therapy, and Re-evaluation.  PLAN FOR NEXT SESSION: update goals, progress note, progress HEP, progress L shoulder ROM and strength   Tyreshia Ingman D Kazmir Oki PT, DPT, GCS  9:53 AM,11/12/23

## 2023-11-12 ENCOUNTER — Ambulatory Visit

## 2023-11-12 DIAGNOSIS — G8929 Other chronic pain: Secondary | ICD-10-CM

## 2023-11-12 DIAGNOSIS — M6281 Muscle weakness (generalized): Secondary | ICD-10-CM

## 2023-11-12 DIAGNOSIS — M25512 Pain in left shoulder: Secondary | ICD-10-CM | POA: Diagnosis not present

## 2023-11-14 ENCOUNTER — Ambulatory Visit

## 2023-11-15 ENCOUNTER — Encounter: Admitting: Nurse Practitioner

## 2023-11-15 ENCOUNTER — Ambulatory Visit: Attending: Nurse Practitioner | Admitting: Nurse Practitioner

## 2023-11-15 ENCOUNTER — Encounter: Payer: Self-pay | Admitting: Nurse Practitioner

## 2023-11-15 VITALS — BP 120/92 | HR 99 | Temp 97.3°F | Ht 72.0 in | Wt 350.0 lb

## 2023-11-15 DIAGNOSIS — M170 Bilateral primary osteoarthritis of knee: Secondary | ICD-10-CM | POA: Diagnosis not present

## 2023-11-15 DIAGNOSIS — M19011 Primary osteoarthritis, right shoulder: Secondary | ICD-10-CM | POA: Insufficient documentation

## 2023-11-15 DIAGNOSIS — M5416 Radiculopathy, lumbar region: Secondary | ICD-10-CM | POA: Diagnosis not present

## 2023-11-15 DIAGNOSIS — G8929 Other chronic pain: Secondary | ICD-10-CM | POA: Insufficient documentation

## 2023-11-15 DIAGNOSIS — M19012 Primary osteoarthritis, left shoulder: Secondary | ICD-10-CM | POA: Insufficient documentation

## 2023-11-15 DIAGNOSIS — Z79899 Other long term (current) drug therapy: Secondary | ICD-10-CM | POA: Diagnosis not present

## 2023-11-15 DIAGNOSIS — M47816 Spondylosis without myelopathy or radiculopathy, lumbar region: Secondary | ICD-10-CM | POA: Insufficient documentation

## 2023-11-15 DIAGNOSIS — M1711 Unilateral primary osteoarthritis, right knee: Secondary | ICD-10-CM | POA: Diagnosis present

## 2023-11-15 DIAGNOSIS — G894 Chronic pain syndrome: Secondary | ICD-10-CM | POA: Insufficient documentation

## 2023-11-15 MED ORDER — HYDROCODONE-ACETAMINOPHEN 7.5-325 MG PO TABS: 1.0000 | ORAL_TABLET | Freq: Four times a day (QID) | ORAL | 0 refills | Status: AC | PRN

## 2023-11-15 MED ORDER — HYDROCODONE-ACETAMINOPHEN 7.5-325 MG PO TABS: 1.0000 | ORAL_TABLET | Freq: Four times a day (QID) | ORAL | 0 refills | Status: DC | PRN

## 2023-11-15 NOTE — Progress Notes (Signed)
 Safety precautions to be maintained throughout the outpatient stay will include: orient to surroundings, keep bed in low position, maintain call bell within reach at all times, provide assistance with transfer out of bed and ambulation.   Nursing Pain Medication Assessment:  Safety precautions to be maintained throughout the outpatient stay will include: orient to surroundings, keep bed in low position, maintain call bell within reach at all times, provide assistance with transfer out of bed and ambulation.  Medication Inspection Compliance: Pill count conducted under aseptic conditions, in front of the patient. Neither the pills nor the bottle was removed from the patient's sight at any time. Once count was completed pills were immediately returned to the patient in their original bottle.  Medication: Hydrocodone /APAP Pill/Patch Count: 15 of 120 pills/patches remain Pill/Patch Appearance: Markings consistent with prescribed medication Bottle Appearance: Standard pharmacy container. Clearly labeled. Filled Date: 29 / 4 / 2025 Last Medication intake:  Today

## 2023-11-15 NOTE — Progress Notes (Signed)
 PROVIDER NOTE: Interpretation of information contained herein should be left to medically-trained personnel. Specific patient instructions are provided elsewhere under Patient Instructions section of medical record. This document was created in part using AI and STT-dictation technology, any transcriptional errors that may result from this process are unintentional.  Patient: [name]  Service: E/M   PCP: Edman Marsa JINNY, DO  DOB: 10-Feb-1955  DOS: 11/15/2023  Provider: Emmy MARLA Blanch, NP  MRN: [id number]  Delivery: Face-to-face  Specialty: Interventional Pain Management  Type: Established Patient  Setting: Ambulatory outpatient facility  Specialty designation: 09  Referring Prov.: Edman Marsa *  Location: Outpatient office facility       History of present illness (HPI) [name], a 68 y.o. year old male, is here today because of his Primary osteoarthritis of right knee [M17.11]. [name] primary complain today is Back Pain (Mid to lower back; bilateral wrists with right worse)  Pertinent problems: [name] has H/O gastric bypass; DJD (degenerative joint disease) of knee; Bilateral carpal tunnel syndrome; Persistent atrial fibrillation (HCC); Chronic radicular lumbar pain; Lumbar radiculopathy; Lumbar degenerative disc disease; Lumbar facet arthropathy; Lumbar facet joint syndrome; Primary osteoarthritis of both wrists; Trigger middle finger of left hand; and Chronic pain syndrome on their pertinent problem list.  Pain Assessment: Severity of Chronic pain is reported as a 8 /10. Location: Back Lower, Mid/deneis. Onset: More than a month ago. Quality: Aching, Burning, Sharp. Timing: Constant. Modifying factor(s): injections help knee;SABRA Vitals:  height is 6' (1.829 m) and weight is 350 lb (158.8 kg) (abnormal). His temperature is 97.3 F (36.3 C) (abnormal). His blood pressure is 120/92 (abnormal) and his pulse is 99. His oxygen saturation is 97%.  BMI: Estimated body  mass index is 47.47 kg/m as calculated from the following:   Height as of this encounter: 6' (1.829 m).   Weight as of this encounter: 350 lb (158.8 kg).  Last encounter: Visit date not found. Last procedure: Visit date not found.  Reason for encounter: medication management.   Discussed the use of AI scribe software for clinical note transcription with the patient, who gave verbal consent to proceed.  History of Present Illness   [name] is a 68 year old male with degenerative bone disease and arthritis who presents with worsening pain in multiple joints. He continues struggling with right knee and left shoulder pain more pronounced on trapezius area under the his shoulder blade. No change in medical history since last visit.  Patient's pain is at baseline.  Patient continues multimodal pain regimen as prescribed.  States that it provides pain relief and improvement in functional status. He was referred by Dr. Marcelino for surgery on his right hand.   He experiences significant pain in his back, right knee, and hands due to degenerative bone disease and arthritis. The pain in his back is located on the left side under his shoulder blade and is persistent, not relieved by pain medication. He has not had an MRI for his back, but reports prior imaging was performed. He has undergone radiofrequency ablation for back pain, which provided temporary relief, but the pain has returned.  He has a history of multiple surgeries on his right knee following a skiing accident, including the insertion and removal of metal hardware and scar tissue removal. Despite these interventions, he continues to experience pain, particularly on both sides of the knee, which sometimes wakes him at night. He has had fluid drawn from the knee multiple times,  with the most recent being 63 cc. He is receiving injections in his knees to manage pain.  He reports issues with his hands, including trigger fingers and a problematic  thumb. He has had injections for the trigger fingers, which provided temporary relief, but he still struggles to straighten his fingers in the morning. He is scheduled for surgery on his right hand.  He is currently taking hydrocodone  7.5-325 mg for pain management without any reported side effects.  He reports that pain medications help for about an hour, but the pain under his left shoulder blade and in his right knee returns and is persistent.     Pharmacotherapy Assessment   Hydrocodone -acetaminophen  7.5-325 mg 3 times daily as needed for pain. MME=30 Monitoring: [place] PMP: PDMP reviewed during this encounter.       Pharmacotherapy: No side-effects or adverse reactions reported. Compliance: No problems identified. Effectiveness: Clinically acceptable.  Margrette Nathanel PARAS, RN  11/15/2023 10:56 AM  Sign when Signing Visit Safety precautions to be maintained throughout the outpatient stay will include: orient to surroundings, keep bed in low position, maintain call bell within reach at all times, provide assistance with transfer out of bed and ambulation.   Nursing Pain Medication Assessment:  Safety precautions to be maintained throughout the outpatient stay will include: orient to surroundings, keep bed in low position, maintain call bell within reach at all times, provide assistance with transfer out of bed and ambulation.  Medication Inspection Compliance: Pill count conducted under aseptic conditions, in front of the patient. Neither the pills nor the bottle was removed from the patient's sight at any time. Once count was completed pills were immediately returned to the patient in their original bottle.  Medication: Hydrocodone /APAP Pill/Patch Count: 15 of 120 pills/patches remain Pill/Patch Appearance: Markings consistent with prescribed medication Bottle Appearance: Standard pharmacy container. Clearly labeled. Filled Date: 28 / 4 / 2025 Last Medication intake:  Today    UDS:   Summary  Date Value Ref Range Status  08/20/2023 FINAL  Final    Comment:    ==================================================================== [employer] (MW) ==================================================================== Test                             Result       Flag       Units  Drug Present and Declared for Prescription Verification   Hydrocodone                     1763         EXPECTED   ng/mg creat   Dihydrocodeine                 248          EXPECTED   ng/mg creat   Norhydrocodone                 1162         EXPECTED   ng/mg creat    Sources of hydrocodone  include scheduled prescription medications.    Dihydrocodeine and norhydrocodone are expected metabolites of    hydrocodone . Dihydrocodeine is also available as a scheduled    prescription medication.  ==================================================================== Test                      Result    Flag   Units      Ref Range   Creatinine  200              mg/dL      >=79 ==================================================================== Declared Medications:  The flagging and interpretation on this report are based on the  following declared medications.  Unexpected results may arise from  inaccuracies in the declared medications.   **Note: The testing scope of this panel includes these medications:   Hydrocodone  (Norco)   **Note: The testing scope of this panel does not include the  following reported medications:   Acetaminophen  (Norco)  Amlodipine  (Norvasc )  Atorvastatin  (Lipitor)  Benazepril  (Lotensin )  Buspirone  (Buspar )  Calcium   Cyclobenzaprine  (Flexeril )  Duloxetine  (Cymbalta )  Eye Drops  Fluticasone  (Flonase )  Furosemide  (Lasix )  Iron   Magnesium  Melatonin  Multivitamin  Rivaroxaban  (Xarelto )  Trazodone  (Desyrel )  Vitamin B12  Vitamin D   Vitamin D3 ==================================================================== For clinical consultation,  please call [phone]. ====================================================================     No results found for: CBDTHCR No results found for: D8THCCBX No results found for: D9THCCBX  ROS  Constitutional: Denies any fever or chills Gastrointestinal: No reported hemesis, hematochezia, vomiting, or acute GI distress Musculoskeletal: Back pain, right knee pain, left shoulder pain Neurological: No reported episodes of acute onset apraxia, aphasia, dysarthria, agnosia, amnesia, paralysis, loss of coordination, or loss of consciousness  Medication Review  Calcium -Magnesium-Vitamin D , DULoxetine , Ferrous Bisglycinate Chelate, HYDROcodone -acetaminophen , Pancrelipase (Lip-Prot-Amyl), amLODipine , benazepril , busPIRone , carboxymethylcellulose, cholecalciferol, cyanocobalamin , cyclobenzaprine , fluticasone , melatonin, multivitamin with minerals, rifaximin, rivaroxaban , and traZODone   History Review  Allergy: [name] is allergic to bupivacaine  liposome. Drug: [name]  reports no history of drug use. Alcohol :  reports that he does not currently use alcohol . Tobacco:  reports that he quit smoking about 44 years ago. His smoking use included cigarettes. He started smoking about 54 years ago. He has a 10 pack-year smoking history. He has quit using smokeless tobacco.  His smokeless tobacco use included snuff. Social: [name]  reports that he quit smoking about 44 years ago. His smoking use included cigarettes. He started smoking about 54 years ago. He has a 10 pack-year smoking history. He has quit using smokeless tobacco.  His smokeless tobacco use included snuff. He reports that he does not currently use alcohol . He reports that he does not use drugs. Medical:  has a past medical history of (HFpEF) heart failure with preserved ejection fraction (HCC), Anxiety, Aortic atherosclerosis, Cardiomyopathy (in setting of Afib), Chronic pain syndrome, Chronic, continuous use of opioids,  Coronary artery disease (11/06/2014), Diverticulosis, DJD (degenerative joint disease) of knee, Former smoker, History of hiatal hernia, History of kidney stones (2012), Hyperlipidemia, Hyperplastic colon polyp, Hypertension, Inguinal hernia, left, Internal hemorrhoids, Intervertebral disc disorder with radiculopathy of lumbosacral region, Lesion of bone of lumbosacral spine, Long term current use of amiodarone , Lower extremity edema, Mixed hyperlipidemia, Morbid obesity (HCC), Multiple falls, Myalgia due to statin, On rivaroxaban  therapy, Osteoarthritis, PAF (paroxysmal atrial fibrillation) (HCC), Pernicious anemia, Prostate cancer (HCC), Pulmonary nodule, right, Rotator cuff tear, left, Sleep difficulties, SVT (supraventricular tachycardia), and Tubular adenoma of colon. Surgical: [name]  has a past surgical history that includes Knee surgery (Right); Gastric bypass (01/17/2000); prostate seeding; Carpal tunnel release (Right, 12/23/2014); Carpal tunnel release (Left, 01/06/2015); Colonoscopy with propofol  (N/A, 10/08/2017); Cardioversion (N/A, 05/15/2019); Cardioversion (N/A, 03/13/2014); Reverse shoulder arthroplasty (Right, 09/27/2021); Bicept tenodesis (Right, 09/27/2021); Shoulder arthroscopy with rotator cuff repair and open biceps tenodesis (Left, 12/27/2021); Cardioversion (N/A, 03/24/2022); Cardioversion (N/A, 05/19/2022); Colonoscopy with propofol  (N/A, 06/07/2022); Esophagogastroduodenoscopy (egd) with propofol  (N/A, 06/07/2022); enteroscopy (06/07/2022); Cardioversion (N/A, 07/25/2022); Cardioversion (N/A, 09/21/2022);  Atrial fibrillation ablation (N/A, 11/17/2022); Reverse shoulder arthroplasty (Left, 02/20/2023); and Flexible sigmoidoscopy (N/A, 11/02/2023). Family: family history includes Arrhythmia in his brother and sister; Cancer in his brother, father, and sister; Heart disease in his father; Hypertension in his father; Stroke in his father.  Laboratory Chemistry Profile   Renal Lab  Results  Component Value Date   BUN 16 09/19/2023   CREATININE 1.12 09/19/2023   BCR SEE NOTE: 09/19/2023   GFRAA 72 07/21/2020   GFRNONAA >60 09/14/2023    Hepatic Lab Results  Component Value Date   AST 19 09/19/2023   ALT 12 09/19/2023   ALBUMIN 4.0 09/13/2023   ALKPHOS 73 09/13/2023   LIPASE 28 03/13/2017    Electrolytes Lab Results  Component Value Date   NA 141 09/19/2023   K 4.9 09/19/2023   CL 106 09/19/2023   CALCIUM  9.3 09/19/2023   MG 2.0 02/01/2021    Bone Lab Results  Component Value Date   VD25OH 30.79 06/27/2023   25OHVITD1 32 02/01/2021   25OHVITD2 <1.0 02/01/2021   25OHVITD3 32 02/01/2021    Inflammation (CRP: Acute Phase) (ESR: Chronic Phase) Lab Results  Component Value Date   CRP 1.7 02/09/2022   ESRSEDRATE 6 02/09/2022   LATICACIDVEN 1.2 09/13/2023         Note: Above Lab results reviewed.  Recent Imaging Review  CT Angio Abd/Pel W and/or Wo Contrast CLINICAL DATA:  Bright red blood per rectum lungs are also. Rectal bleeding for 4 days.  EXAM: CTA ABDOMEN AND PELVIS WITHOUT AND WITH CONTRAST  TECHNIQUE: Multidetector CT imaging of the abdomen and pelvis was performed using the standard protocol during bolus administration of intravenous contrast. Multiplanar reconstructed images and MIPs were obtained and reviewed to evaluate the vascular anatomy.  RADIATION DOSE REDUCTION: This exam was performed according to the departmental dose-optimization program which includes automated exposure control, adjustment of the mA and/or kV according to patient size and/or use of iterative reconstruction technique.  CONTRAST:  OMNIPAQUE  IOHEXOL  350 MG/ML SOLN  COMPARISON:  05/15/2023  FINDINGS: VASCULAR  Aorta: Calcification of the aorta. No aneurysm or dissection. No significant stenosis.  Celiac: Patent without evidence of aneurysm, dissection, vasculitis or significant stenosis.  SMA: Patent without evidence of aneurysm,  dissection, vasculitis or significant stenosis.  Renals: Both renal arteries are patent without evidence of aneurysm, dissection, vasculitis, fibromuscular dysplasia or significant stenosis.  IMA: Patent without evidence of aneurysm, dissection, vasculitis or significant stenosis.  Inflow: Patent without evidence of aneurysm, dissection, vasculitis or significant stenosis.  Proximal Outflow: Bilateral common femoral and visualized portions of the superficial and profunda femoral arteries are patent without evidence of aneurysm, dissection, vasculitis or significant stenosis.  Veins: No obvious venous abnormality within the limitations of this arterial phase study.  Review of the MIP images confirms the above findings.  NON-VASCULAR  Lower chest: 5 mm pulmonary nodule in the right lung base. No change since previous studies dating back to 03/13/2017. Long-term stability suggests benign etiology and no imaging follow-up is indicated.  Hepatobiliary: No focal liver lesions. Cholelithiasis without wall thickening or stranding. No bile duct dilatation.  Pancreas: Unremarkable. No pancreatic ductal dilatation or surrounding inflammatory changes.  Spleen: Normal in size without focal abnormality.  Adrenals/Urinary Tract: Adrenal glands are unremarkable. Kidneys are normal, without renal calculi, focal lesion, or hydronephrosis. Bladder is unremarkable.  Stomach/Bowel: Stomach, small bowel, and colon are not abnormally distended. Diverticulosis of the sigmoid colon. Fat stranding around the junction of the rectosigmoid colon with  small pericolonic collection likely representing enlarged diverticula and measuring about 1.1 cm. This is consistent with acute diverticulitis. Wall thickening this area is probably inflammatory but follow-up after resolution of acute process is recommended to exclude underlying colon cancer. No discrete abscess. No intraluminal  contrast extravasation or contrast pooling. No focal site of acute gastrointestinal bleeding is identified. Postoperative changes consistent with gastric bypass.  Lymphatic: Scattered retroperitoneal and pelvic lymph nodes are not pathologically enlarged.  Reproductive: Prostate seed implants.  Prostate gland is atrophic.  Other: No free air or free fluid in the abdomen. Abdominal wall musculature appears intact.  Musculoskeletal: Degenerative changes throughout the spine. Spondylolysis with minimal spondylolisthesis at L5-S1.  IMPRESSION: VASCULAR  1. Aortic atherosclerosis. 2. No evidence of aneurysm or dissection or significant stenosis involving the abdominal aorta or visceral branches.  NON-VASCULAR  1. Colonic diverticulosis with prominent diverticulum and surrounding stranding with wall thickening at the level of the rectosigmoid colon. This most likely represents acute diverticulitis. Follow-up after resolution of acute process is recommended to exclude underlying colon neoplasm given the focal wall thickening. No abscess. 2. Cholelithiasis without evidence of acute cholecystitis.  Electronically Signed   By: Elsie Gravely M.D.   On: 09/13/2023 20:09 CT CARDIAC SCORING (SELF PAY ONLY) Addendum: ADDENDUM REPORT: 09/13/2023 19:35   EXAM:  OVER-READ INTERPRETATION  CT CHEST   The following report is an over-read performed by radiologist Dr.  Fonda [facility] on 09/13/2023. This  over-read does not include interpretation of cardiac or coronary  anatomy or pathology. The coronary calcium  score interpretation by  the cardiologist is attached.   COMPARISON:  03/13/2017.   FINDINGS:  Cardiovascular:  See findings discussed in the body of the report.   Mediastinum/Nodes: No suspicious adenopathy identified. Imaged  mediastinal structures are unremarkable.   Lungs/Pleura: Right lower lobe 5 mm nodule represents a stable  finding  compared to 2019. Imaged lungs are otherwise clear. No  pleural effusion or pneumothorax.   Upper Abdomen: No acute abnormality.  Postop changes in the stomach.   Musculoskeletal: No chest wall abnormality. No acute osseous  findings.   IMPRESSION:  No acute extracardiac incidental findings.   Electronically Signed    By: Fonda Field M.D.    On: 09/13/2023 19:35 Narrative: CLINICAL DATA:  Risk stratification  EXAM: Coronary Calcium  Score  TECHNIQUE: The patient was scanned on a Siemens Somatom scanner. Axial non-contrast 3 mm slices were carried out through the heart. The data set was analyzed on a dedicated work station and scored using the Agatson method.  FINDINGS: Non-cardiac: See separate report from [facility].  Ascending Aorta: Normal size of the tubular portion. Mildly dilated aortic root at the sinuses of 40mm.  Pericardium: Normal  Coronary arteries: Normal origin of left and right coronary arteries. Likely left-dominant circulation, though incompletely evaluated on this exam. Distribution of arterial calcifications if present, as noted below;  LM 34  LAD 437  LCx 342  RCA 0  Total 812  IMPRESSION AND RECOMMENDATION: 1. Coronary calcium  score of 812. This was 81st percentile for age and sex matched control.  2. Mildly dilated aortic root.  CAC >300 in LAD, LCx. CAC-DRS A3/N2.  CAC-DRS A0: CAC score 0: Statin generally not recommended unless other high risk condition (e.g., FH, DM2, tobacco use, etc.)  CAC-DRS A1: CAC score 1-99: moderate intensity statin  CAC-DRS A2: CAC score 100-299: moderate to high intensity statin + ASA 81mg   CAC-DRS A3: CAC score > 300: high intensity  statin + ASA 81mg   Consider cardiology consultation.  Continue heart healthy lifestyle and risk factor modification.  Electronically Signed: By: Caron Poser On: 09/11/2023 15:04 Note: Reviewed        Physical Exam  Vitals: BP (!) 120/92    Pulse 99   Temp (!) 97.3 F (36.3 C)   Ht 6' (1.829 m)   Wt (!) 350 lb (158.8 kg)   SpO2 97%   BMI 47.47 kg/m  BMI: Estimated body mass index is 47.47 kg/m as calculated from the following:   Height as of this encounter: 6' (1.829 m).   Weight as of this encounter: 350 lb (158.8 kg). Ideal: Ideal body weight: 77.6 kg (171 lb 1.2 oz) Adjusted ideal body weight: 110.1 kg (242 lb 10.3 oz) General appearance: Well nourished, well developed, and well hydrated. In no apparent acute distress Mental status: Alert, oriented x 3 (person, place, & time)       Respiratory: No evidence of acute respiratory distress Eyes: PERLA  Musculoskeletal: +LBP, left shoulder pain, right knee pain Upper Extremity (UE) Exam      Right  Left  Inspection    Skin color, temperature, and hair growth are WNL. No peripheral edema or cyanosis. No masses, redness, swelling, asymmetry, or associated skin lesions. No contractures.  Skin color, temperature, and hair growth are WNL. No peripheral edema or cyanosis. No masses, redness, swelling, asymmetry, or associated skin lesions. No contractures.          Functional ROM    Pain restricted ROM          Pain restricted ROM for shoulder          Muscle Tone/Strength    Functionally intact. No obvious neuro-muscular anomalies detected.  Functionally intact. No obvious neuro-muscular anomalies detected.          Sensory (Neurological)    Musculoskeletal pain pattern          Musculoskeletal pain pattern affecting the shoulder          Palpation    No palpable anomalies              No palpable anomalies                      Maneuver Shoulder abduction (deltoid/supraspinatus, axillary/supra scapular n,, C5) Elbow flexion (biceps brachial, musculoskeletal n, C5-6) Elbow extension (triceps, radial n, C7) Finger abduction (interossei, ulnar n, T1)    Shoulder abduction (deltoid/supraspinatus, axillary/supra scapular n,, C5) Elbow flexion (biceps brachial, musculoskeletal  n, C5-6) Elbow extension (triceps, radial n, C7) Wrist extensors (C6) Finger extensors (C8) Finger abduction (interossei, ulnar n, T1)            Provocative Test    Phalen's test: deferred Tinel's test: deferred Apley's scratch test (touch opposite shoulder):  Action 1 (Across chest): deferred Action 2 (Overhead): deferred Action 3 (LB reach): deferred  Phalen's test: deferred Tinel's test: deferred Apley's scratch test (touch opposite shoulder):  Action 1 (Across chest): Decreased ROM Action 2 (Overhead): Decreased ROM Action 3 (LB reach): Decreased ROM             Level  Myotome  Dermatome  Sclerotome  ROM  C5  Elbow flexion  Lateral upper arm      C6  Wrist extension  Thumb and index      C7  Elbow extension  Middle finger      C8  Finger extension  Ring and pinky finger  T1  Finger abduction  Medial elbow and axilla                                                                                                                                         Assessment   Diagnosis Status  1. Primary osteoarthritis of right knee   2. Bilateral primary osteoarthritis of knee   3. Chronic pain syndrome   4. Lumbar facet arthropathy   5. Chronic radicular lumbar pain   6. Localized osteoarthritis of shoulder regions, bilateral   7. Medication management    Controlled Controlled Controlled   Updated Problems: Problem  Osteoarthritis of knee (R>L)  Medication Management    Plan of Care  Problem-specific:  Assessment and Plan    Right knee osteoarthritis with chronic pain Chronic right knee pain persists despite previous interventions. - Order MRI of the right knee to further evaluation - Continue hydrocodone  7.5-325 mg.  Chronic pain syndrome: Patient's pain is well-controlled with hydrocodone , will continue on current medication regimen.  Prescribing drug monitoring (PMP) reviewed, findings consistent with the use of prescribed medication and no  evidence of narcotic misuse or abuse.  Urine drug screening (UDS) up to date and consistent with the use of prescribed medication.  No side effects or any adverse reaction reported to medication.  The patient was advised to continue stretching exercise for better functional mobility for his left shoulder pain.  Schedule follow-up in 90 days for medication management.  Left shoulder pain, status post reverse shoulder replacement Persistent severe pain post-surgery, inadequate response to current medications. - Order MRI of the left shoulder for further evaluation  [name] has a current medication list which includes the following long-term medication(s): amlodipine , benazepril , duloxetine , ferrous bisglycinate chelate, trazodone , and xarelto .  Pharmacotherapy (Medications Ordered): Meds ordered this encounter  Medications   HYDROcodone -acetaminophen  (NORCO) 7.5-325 MG tablet    Sig: Take 1 tablet by mouth every 6 (six) hours as needed.    Dispense:  120 tablet    Refill:  0   HYDROcodone -acetaminophen  (NORCO) 7.5-325 MG tablet    Sig: Take 1 tablet by mouth every 6 (six) hours as needed.    Dispense:  120 tablet    Refill:  0   HYDROcodone -acetaminophen  (NORCO) 7.5-325 MG tablet    Sig: Take 1 tablet by mouth every 6 (six) hours as needed.    Dispense:  120 tablet    Refill:  0   Orders:  Orders Placed This Encounter  Procedures   MR SHOULDER LEFT WO CONTRAST    Standing Status:   Future    Expiration Date:   02/15/2024    Scheduling Instructions:     Please make sure that the patient understands that this needs to be done as soon as possible. Never have the patient do the imaging just before the next appointment. Inform patient that having the imaging done  within the [facility] will expedite the availability of the results and will provide      imaging availability to the requesting physician. In addition inform the patient that the imaging order has an expiration date and  will not be renewed if not done within the active period.    What is the patient's sedation requirement?:   No Sedation    Does the patient have a pacemaker or implanted devices?:   No    Preferred imaging location?:   [facility] (table limit-350lbs)    Call Results- Best Contact Number?:   [phone] [facility] at [facility]    Radiology Contrast Protocol - do NOT remove file path:   \\[email or web address]   MR KNEE RIGHT WO CONTRAST    Standing Status:   Future    Expiration Date:   02/15/2024    Scheduling Instructions:     Please make sure that the patient understands that this needs to be done as soon as possible. Never have the patient do the imaging just before the next appointment. Inform patient that having the imaging done within the [facility] will expedite the availability of the results and will provide      imaging availability to the requesting physician. In addition inform the patient that the imaging order has an expiration date and will not be renewed if not done within the active period.    What is the patient's sedation requirement?:   No Sedation    Does the patient have a pacemaker or implanted devices?:   No    Preferred imaging location?:   [facility] (table limit-350lbs)    Call Results- Best Contact Number?:   [phone] [facility] at [facility]    Radiology Contrast Protocol - do NOT remove file path:   \\[email or web address]        Return in about 3 months (around 02/15/2024) for (F2F), (MM), Emmy Blanch NP.    Recent Visits Date Type Provider Dept  10/03/23 Procedure visit Marcelino Nurse, MD [facility]  08/20/23 Procedure visit Marcelino Nurse, MD [facility]  Showing recent visits within past 90 days and meeting all other requirements Today's Visits Date Type Provider Dept  11/15/23 Office  Visit Tyianna Menefee K, NP [facility]  Showing today's visits and meeting all other requirements Future Appointments No visits were found meeting these conditions. Showing future appointments within next 90 days and meeting all other requirements  I discussed the assessment and treatment plan with the patient. The patient was provided an opportunity to ask questions and all were answered. The patient agreed with the plan and demonstrated an understanding of the instructions.  Patient advised to call back or seek an in-person evaluation if the symptoms or condition worsens. I personally spent a total of 30 minutes in the care of the patient today including preparing to see the patient, getting/reviewing separately obtained history, performing a medically appropriate exam/evaluation, counseling and educating, placing orders, referring and communicating with other health care professionals, documenting clinical information in the EHR, independently interpreting results, communicating results, and coordinating care.   Note by: Zen Felling K Jt Brabec, NP (TTS and AI technology used. I apologize for any typographical errors that were not detected and corrected.) Date: 11/15/2023; Time: 11:40 AM

## 2023-11-16 ENCOUNTER — Encounter: Payer: Self-pay | Admitting: Internal Medicine

## 2023-11-16 NOTE — Telephone Encounter (Signed)
 Encounter opened in error.

## 2023-11-19 ENCOUNTER — Ambulatory Visit: Attending: Orthopedic Surgery

## 2023-11-19 DIAGNOSIS — G8929 Other chronic pain: Secondary | ICD-10-CM | POA: Diagnosis present

## 2023-11-19 DIAGNOSIS — M25512 Pain in left shoulder: Secondary | ICD-10-CM | POA: Diagnosis present

## 2023-11-19 DIAGNOSIS — M6281 Muscle weakness (generalized): Secondary | ICD-10-CM | POA: Insufficient documentation

## 2023-11-19 NOTE — Therapy (Signed)
 OUTPATIENT PHYSICAL THERAPY SHOULDER TREATMENT  Patient Name: [name] MRN: [id number] DOB:December 31, 1955, 68 y.o., male Today's Date: 11/19/2023   PT End of Session - 11/19/23 1307     Visit Number 26    Number of Visits 49    Date for Recertification  01/03/24    Authorization Type eval: 02/26/23, [facility] Appeal Approved [id number] visits between 09/03/23-12/21/23  [facility] Medicare 2025  CO:Ajdzi on AUTH  Copay: $20  OOP:$3900/$0 used  [name] is req  Ref #:[id number]    Authorization - Visit Number 10    Authorization - Number of Visits 12    PT Start Time 1317    PT Stop Time 1351    PT Time Calculation (min) 34 min    Activity Tolerance Patient tolerated treatment well    Behavior During Therapy WFL for tasks assessed/performed         Past Medical History:  Diagnosis Date   (HFpEF) heart failure with preserved ejection fraction (HCC)    a.) TTE 12/26/2013: EF 45-50%, mild ant and antsept HK. mild MR. Mod dil LA. nl RV fxn. Rhythm was Afib; b.) TTE 07/04/2019: EF 55%, mid-apical anteroseptal HK, mild MAC, mild Ao sclerosis, G2DD, RVSP 45.3; c.) TTE 06/02/2022: EF 55-60%, no RWMAs, sev LAE, RVSP 37.9, mild-mod MR, mild AR, AoV sclerosis without stenosis, asc Ao 38 mm   Anxiety    Aortic atherosclerosis    Cardiomyopathy (in setting of Afib)    a.) TTE 12/26/2013: EF 45-50%; b.) TTE 07/04/2019: EF 55%; c.) TTE 06/02/2022: EF 55-60%   Chronic pain syndrome    a.) followed by pain management   Chronic, continuous use of opioids    a.) on COT; followed by pain management   Coronary artery disease 11/06/2014   a.) cCTA 11/06/2014: Ca2+ = 224 (74th %ile; LAD distribution)   Diverticulosis    DJD (degenerative joint disease) of knee    Former smoker    History of hiatal hernia    History of kidney stones 2012   Hyperlipidemia    Hyperplastic colon polyp    Hypertension    Inguinal hernia, left    Internal hemorrhoids    Intervertebral disc disorder with radiculopathy of lumbosacral  region    Lesion of bone of lumbosacral spine    L5   Long term current use of amiodarone     Lower extremity edema    Mixed hyperlipidemia    Morbid obesity (HCC)    Multiple falls    Myalgia due to statin    On rivaroxaban  therapy    Osteoarthritis    PAF (paroxysmal atrial fibrillation) (HCC)    a.) CHA2DS2VASc = 4 (age, HTN, CHF, vascular disease) as of 02/19/23;  b.) s/p DCCV 03/13/14 (200 J), 05/15/19 (150 J x 1, 200 J x2), 05/19/2022 (150 J x1, 200 J x2; pads changed to ant/lat postion with additional 200 J x 2 -> did not convert), 07/25/22 (200 J), 09/21/2022 (200 J); c.) s/p PVI ablation 11/17/2022; d.) rate/rhythm maintained on amiodarone ; chronically anticoagulated on rivaroxaban    Pernicious anemia    Prostate cancer (HCC)    Pulmonary nodule, right    a. 10/2014 Cardiac CTA: 7mm RLL nodule; b. 04/2015 CT Chest: stable 7mm RLL nodule. No new nodules; 02/2017 CTA Chest: stable, benign, 7mm RLL pulm nodule.   Rotator cuff tear, left    Sleep difficulties    a.) takes melatonin + trazodone  PRN   SVT (supraventricular tachycardia)    Tubular adenoma of  colon    Past Surgical History:  Procedure Laterality Date   ATRIAL FIBRILLATION ABLATION N/A 11/17/2022   Procedure: ATRIAL FIBRILLATION ABLATION; Location: [facility]; Surgeon: Gehi, Anil, MD   BICEPT TENODESIS Right 09/27/2021   Procedure: Right reverse shoulder arthroplasty, biceps tenodesis;  Surgeon: Tobie Priest, MD;  Location: [facility];  Service: Orthopedics;  Laterality: Right;   CARDIOVERSION N/A 05/15/2019   Procedure: CARDIOVERSION;  Surgeon: Perla Evalene PARAS, MD;  Location: [facility];  Service: Cardiovascular;  Laterality: N/A;   CARDIOVERSION N/A 03/13/2014   Procedure: CARDIOVERSION; Location: [facility]; Surgeon: Evalene Perla, MD   CARDIOVERSION N/A 03/24/2022   Procedure: CARDIOVERSION;  Surgeon: Perla Evalene PARAS, MD;  Location: [facility];  Service: Cardiovascular;  Laterality: N/A;   CARDIOVERSION N/A 05/19/2022    Procedure: CARDIOVERSION;  Surgeon: Perla Evalene PARAS, MD;  Location: [facility];  Service: Cardiovascular;  Laterality: N/A;   CARDIOVERSION N/A 07/25/2022   Procedure: CARDIOVERSION;  Surgeon: Fernande Elspeth BROCKS, MD;  Location: [facility];  Service: Cardiovascular;  Laterality: N/A;   CARDIOVERSION N/A 09/21/2022   Procedure: CARDIOVERSION;  Surgeon: Fernande Elspeth BROCKS, MD;  Location: [facility];  Service: Cardiovascular;  Laterality: N/A;   CARPAL TUNNEL RELEASE Right 12/23/2014   Procedure: CARPAL TUNNEL RELEASE;  Surgeon: Kayla Pinal, MD;  Location: [facility];  Service: Orthopedics;  Laterality: Right;   CARPAL TUNNEL RELEASE Left 01/06/2015   Procedure: CARPAL TUNNEL RELEASE;  Surgeon: Kayla Pinal, MD;  Location: [facility];  Service: Orthopedics;  Laterality: Left;   COLONOSCOPY WITH PROPOFOL  N/A 10/08/2017   Procedure: COLONOSCOPY WITH PROPOFOL ;  Surgeon: Unk Corinn Skiff, MD;  Location: [facility];  Service: Gastroenterology;  Laterality: N/A;   COLONOSCOPY WITH PROPOFOL  N/A 06/07/2022   Procedure: COLONOSCOPY WITH PROPOFOL ;  Surgeon: Unk Corinn Skiff, MD;  Location: [facility]OSCOPY;  Service: Gastroenterology;  Laterality: N/A;   ENTEROSCOPY  06/07/2022   Procedure: ENTEROSCOPY;  Surgeon: Unk Corinn Skiff, MD;  Location: [facility];  Service: Gastroenterology;;   ESOPHAGOGASTRODUODENOSCOPY (EGD) WITH PROPOFOL  N/A 06/07/2022   Procedure: ESOPHAGOGASTRODUODENOSCOPY (EGD) WITH PROPOFOL ;  Surgeon: Unk Corinn Skiff, MD;  Location[facility];  Service: Gastroenterology;  Laterality: N/A;   FLEXIBLE SIGMOIDOSCOPY N/A 11/02/2023   Procedure: KINGSTON SIDE;  Surgeon: Unk Corinn Skiff, MD;  Location: [facility]OS[facility];  Service: Gastroenterology;  Laterality: N/A;   GASTRIC BYPASS  01/17/2000   KNEE SURGERY Right    knee trauma x3   prostate seeding     REVERSE SHOULDER ARTHROPLASTY Right 09/27/2021   Procedure: Right reverse shoulder arthroplasty, biceps tenodesis;   Surgeon: Tobie Priest, MD;  Location: [facility];  Service: Orthopedics;  Laterality: Right;   REVERSE SHOULDER ARTHROPLASTY Left 02/20/2023   Procedure: Left reverse shoulder arthroplasty, biceps tenodesis;  Surgeon: Tobie Priest, MD;  Location: [facility];  Service: Orthopedics;  Laterality: Left;   SHOULDER ARTHROSCOPY WITH ROTATOR CUFF REPAIR AND OPEN BICEPS TENODESIS Left 12/27/2021   Procedure: Left shoulder arthroscopic cuff repair (supraspinatus and subscapularis) with Regeneten Patch application;  Surgeon: Tobie Priest, MD;  Location: [facility];  Service: Orthopedics;  Laterality: Left;   Patient Active Problem List   Diagnosis Date Noted   Anticoagulated 09/14/2023   Acute diverticulitis 09/13/2023   Chronic diastolic CHF (congestive heart failure) (HCC) 09/13/2023   Symptomatic anemia 04/26/2022   Chronic pain of both knees 03/30/2022   Bilateral primary osteoarthritis of knee 03/30/2022   Shortness of breath 03/24/2022   Vitamin D  deficiency 02/24/2022   S/p reverse total shoulder arthroplasty 09/27/2021   Lesion of  bone of lumbosacral spine (L5) 05/12/2021   Localized osteoarthritis of shoulder regions, bilateral 03/29/2021   Chronic pain of both shoulders 03/29/2021   Drug-induced myopathy 02/21/2021   Trigger finger of right hand 11/15/2020   Prostate cancer (HCC) 07/26/2020   Insomnia 08/01/2019   Primary osteoarthritis of both wrists 02/17/2019   Trigger middle finger of left hand 02/17/2019   Chronic pain syndrome 02/17/2019   Chronic radicular lumbar pain 10/08/2018   Lumbar radiculopathy 10/08/2018   Lumbar degenerative disc disease 10/08/2018   Lumbar facet arthropathy 10/08/2018   Lumbar facet joint syndrome 10/08/2018   Intervertebral disc disorder with radiculopathy of lumbosacral region    Osteoarthritis of knee (R>L) 11/30/2017   Osteoarthritis of wrist 11/30/2017   Pernicious anemia 05/22/2016   Paroxysmal atrial fibrillation 12/20/2015   Carpal tunnel  syndrome 11/16/2014   DJD (degenerative joint disease) of knee 10/27/2014   Bilateral carpal tunnel syndrome 10/27/2014   Morbid obesity with BMI of 45.0-49.9, adult (HCC) 10/27/2014   Mixed hyperlipidemia 10/27/2014   H/O gastric bypass 03/20/2014   Hyperkalemia 03/20/2014   History of prostate cancer 12/26/2013   Essential hypertension 12/26/2013   Medication management 12/26/2013   BRBPR (bright red blood per rectum) 12/26/2013   PCP: Dr. Edman  REFERRING PROVIDER: Dr. Earnestine Blanch  REFERRING DIAG: [phone] (ICD-10-CM) - Complete rotator cuff tear or rupture of left shoulder, not specified as traumatic   RATIONALE FOR EVALUATION AND TREATMENT: Rehabilitation  THERAPY DIAG: Chronic left shoulder pain  Muscle weakness (generalized)  ONSET DATE: 02/20/23  FOLLOW-UP APPT SCHEDULED WITH REFERRING PROVIDER: Yes   FROM INITIAL EVALUATION SUBJECTIVE:                                                                                                                                                                                         SUBJECTIVE STATEMENT:  L reverse TSR 02/20/23  PERTINENT HISTORY:  KIER SMEAD is a 69 y.o. male who initially underwent arthroscopic subscapularis repair and side-to-side supraspinatus repair with Regeneten patch application by Dr. Blanch 12/27/2021. He underwent post-surgical physical therapy with initial minimal improvement followed by worsening shoulder strength/function. Repeat MRI showed large, retracted subscapularis re-tear and supraspinatus re-tear with signs of rotator cuff arthropathy. Conservative measures including medications, physical therapy, and cortisone injections did not provide adequate relief. The patient elected to proceed with reverse shoulder arthroplasty and biceps tenodesis on 02/20/23. Intraoperative findings include retracted full-thickness subscapularis tear, partial-thickness supraspinatus tear, and biceps tendinopathy. Surgery was  a standard deltopectoral incision under general anesthesia along with an interscalene block. Per op note, operative arm to remain in sling at all times except ROM exercises  and hygiene. Can perform pendulums, elbow/wrist/hand ROM exercises. Passive ROM allowed to 90 FF and 30 ER. ASA 325mg  x 6 weeks for DVT ppx (pt states that he was actually instructed to start back on his Xarelto  and not to use ASA). Plan for PT starting on POD #3-4. Pt denies any post-operative complications from surgery. Pt has a history of chronic back pain and has undergone multiple lumbar facet, medial branch radiofrequency ablations. He also has a past history of cardiomyopathy (in setting of Afib), DJD (degenerative joint disease) of knee, kidney stones, lower extremity edema, PAF (paroxysmal atrial fibrillation), pernicious anemia, prostate cancer, and a pulmonary nodule. He also has a past surgical history that includes R reverse TSR, knee surgery, gastric bypass (2002), prostate seeding, carpal tunnel release (Right, 12/23/2014; Left, 01/06/2015), and multiple cardioversions.   PAIN:  Pain Intensity: No resting pain upon arrival Pain location: L shoulder Pain Quality: Nerve pain Radiating: No  Numbness/Tingling: No Focal Weakness: Yes 24-hour pain behavior: Varies History of prior shoulder or neck/shoulder injury, pain, surgery, or therapy: Yes PT for L shoulder s/p RTC repair, history of R reverse TSR; Dominant hand: right Red flags Positive: history of prostate CA, Negative: chills/fever, night sweats, nausea, vomiting, unrelenting pain, unexplained weight gain/loss  PRECAUTIONS: Shoulder, per operative note: operative arm to remain in sling at all times except ROM exercises and hygiene. Can perform pendulums, elbow/wrist/hand ROM exercises. Passive ROM allowed to 90 FF and 30 ER.   General reverse TSR precautions include no combined shoulder extension, IR, and adduction. No shoulder and elbow AROM (bicep tenodesis)  for at least the first 4 weeks   WEIGHT BEARING RESTRICTIONS: Yes no WB through LUE  FALLS: Has patient fallen in last 6 months? 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  Recent memory is intact.  Remote memory is intact.  Attention span and concentration are intact.  Expressive speech is intact.  Patient's fund of knowledge is within normal limits for educational level.    Gross Musculoskeletal Assessment Tremor: None Bulk: Normal Tone: Normal Incision is clean and dry with minimal blood staining on honeycomb dressing as well as minimal swelling around the incision. Skin is cool to touch and no signs of infection present.   Gait Deferred full gait assessment  Posture Forward head  with upper thoracic kyphosis and rounded shoulders  Cervical Screen Deferred  ROM ROM (Normal range in degrees)    Right (AROM) Left (PROM)  Shoulder    Flexion 160 90 (limited per protocol)  Extension    Abduction 180 90 (scaption)  External Rotation 88 0  Internal Rotation 70 stomach  Hands Behind Head    Hands Behind Back        Elbow    Flexion Full Full  Extension 0 0  Pronation WNL Full palm up  Supination WNL Full palm down  (* = pain; Blank rows = not tested)  UE MMT: MMT (out  of 5) Right Left   Shoulder   Flexion 5   Extension     Abduction 5   External rotation 5   Internal rotation 5   Horizontal abduction    Horizontal adduction    Lower Trapezius    Rhomboids        Elbow  Flexion 5   Extension 5   Pronation 5   Supination 5       Wrist  Flexion 5 5  Extension 5 5  Radial deviation  5  Ulnar deviation  5      MCP  Flexion 5 5  Extension 5 5  Abduction 5 5  Adduction 5 5  (* = pain; Blank rows = not tested)  AROM AROM     Right Left  Shoulder    Flexion 180 160  Extension    Abduction 152 138  External Rotation 90 88  Internal Rotation 70 74  Hands Behind Head C6 C3  Hands Behind Back L1 Lateral waistline  (* = pain; Blank rows = not tested)  07/31/23 UE MMT: MMT (out of 5) Right Left   Shoulder   Flexion 5 4*  Abduction 5 4*  External rotation (seated) 5 4*  Internal rotation (seated) 4+ 4+*  Horizontal abduction Unable to lay prone Unable to lay prone  Horizontal adduction    Lower Trapezius    Rhomboids        Elbow  Flexion 5 5  Extension 5 5  Pronation 5 5  Supination 5 5*   Shoulder Flexion  Peak Force (lb) Right 21.09 lb Left 10.19 lb Strength Difference 10.90 lb* Percentage Difference 69.66%*  Shoulder Abduction Peak Force (lb) Right 14.57 lb Left 8.33 lb Strength Difference 6.24 lb* Percentage Difference 54.48%*  Shoulder External Rotation Peak Force (lb) Right 20.28 lb Left 16.90 lb Strength Difference 3.37 lb* Percentage Difference 18.15%*  Shoulder Internal Rotation Peak Force (lb) Right 19.77 lb Left 20.43 lb Strength Difference 0.66 lb Percentage Difference 3.28%  10/11/23: AROM AROM     Right Left  Shoulder    Flexion  156  Extension    Abduction/Scaption  84*  External Rotation  50*  Internal Rotation  54*  Hands Behind Head    Hands Behind Back    (* = pain; Blank rows = not tested)  UE MMT: MMT (out of 5) Right Left   Shoulder   Flexion  4*  Abduction  4*  External rotation (seated)  4*  Internal rotation (seated)   4+*  Horizontal abduction  Unable to lay prone  Horizontal adduction    Lower Trapezius    Rhomboids        Elbow  Flexion  5  Extension  5  Pronation  5  Supination  5    TODAY'S TREATMENT: 11/19/2023    Subjective:  Pt states that he his doing alright today. He has been having worsening L shoulder pain over the last week. He rates his L shoulder pain as 6/10 today and he has already utilized pain medication prior to therapy. 6/10 L shoulder pain upon arrival.  He saw pain management who is going to order an MRI of his shoulder.   PAIN: 6/10;   Ther-ex  UBE x 2 minutes forward for LUE strengthening and ROM; In semi-recumbent position: L shoulder flexion with 2# dumbbell 2 x 10; L shoulder scaption, unweighted, 2x 10; L shoulder overhead press with 2# DB 2 x 10; L shoulder manually  resisted ER 2 x 10; L shoulder manually resisted IR 2 x 10; L elbow curl with with manual resistance 2 x 10; L elbow manually resisted extension 2 x 10; Dowel chest press with manual resistance from therapist 2 x 10; Dowel rows with manual resistance 2 x 10;   Not performed: L shoulder isometric extension 3s hold 2 x 10; L shoulder isometric abduction 3s hold x 10; L shoulder isometric abduction and extension 2 x 10 each; Nautilus rows with hand grips 50# 3 x 10; Seated pulleys for L shoulder AAROM overhead flexion and abduction x multiple bouts each direction; Seated W's with green tband 2 x 10; Seated shoulder rolls 2 x 10; Seated horizontal abduction with blue tband 2 x 10; Seated L shoulder extension with blue tband 2 x 10;   HOME EXERCISE PROGRAM:  Access Code: [email or web address] URL: [email or web address] Date: 07/31/2023 Prepared by: Selinda Eck  Exercises - Seated Cervical Sidebending Stretch (Mirrored)  - 2 x daily - 7 x weekly - 3 reps - 30s hold - Seated Scapular Retraction  - 2 x daily - 7 x weekly - 2 sets - 10 reps - 3s hold - Standing Shoulder Flexion Full  Range Single Arm  - 1 x daily - 7 x weekly - 2 sets - 10 reps - 3s hold - Standing Shoulder Abduction Full Range Single Arm  - 1 x daily - 7 x weekly - 2 sets - 10 reps - 3s hold - Wrist Flexion AROM  - 2 x daily - 7 x weekly - 2 sets - 10 reps - 3s hold - Wrist Extension AROM  - 1 x daily - 7 x weekly - 2 sets - 10 reps - 3s hold - Shoulder External Rotation and Scapular Retraction with Resistance  - 1 x daily - 7 x weekly - 2 sets - 10 reps - 3s hold    ASSESSMENT: Continued strengthening with patient during session today. Regressed to 2# dumbbells due to increasing levels of pain. Session abbreviated due to high levels of pain. Plan to progress strengthening at future sessions as pain allows. Mr. Demicco would benefit from ongoing PT to prevent additional decline, regain L shoulder strength, and decrease pain. Pt encouraged to continue HEP and follow up as directed. He will continue to benefit from skilled therapy to address remaining deficits in L shoulder weakness and pain order to improve quality of life and return to PLOF.     OBJECTIVE IMPAIRMENTS: decreased ROM, decreased strength, and pain.   ACTIVITY LIMITATIONS: carrying, lifting, bathing, toileting, dressing, and reach over head  PARTICIPATION LIMITATIONS: meal prep, cleaning, laundry, driving, shopping, community activity, and yard work  PERSONAL FACTORS: Past/current experiences, Time since onset of injury/illness/exacerbation, and 3+ comorbidities: OA, anemia, anxiety, cardiomyopathy, chronic pain are also affecting patient's functional outcome.   REHAB POTENTIAL: Good  CLINICAL DECISION MAKING: Unstable/unpredictable  EVALUATION COMPLEXITY: High   GOALS: Goals reviewed with patient? Yes  SHORT TERM GOALS: Target date: 07/09/2023   Pt will be independent with HEP to improve strength and decrease shoulder pain to improve pain-free function at home and work. Baseline: Issued and progressing Goal status: ONGOING   LONG  TERM GOALS: Target date: 08/20/2023  1.  Pt will report no further L shoulder pain with all AROM in order to complete all ADLs and improve pain-free function. Baseline: 05/28/23: worst pain 7/10; 07/31/23: 10/10; 10/12/23: 8/10; Goal status: ONGOING  2.  Pt will decrease QuickDASH score to below 30% in order  to demonstrate clinically significant reduction in disability related to shoulder pain        Baseline: 02/26/23: 72.7%; 05/28/23: 40.9%; 07/31/23: 56.8%; 10/11/23: 59.1% Goal status: PARTIALLY MET  3. Pt will increase strength of pain-free L shoulder flexion and scaption to at least 4/5 in order to demonstrate improvement in strength and function         Baseline: 02/26/23: Unable to test; 05/28/23: flexion: 4+/5, pain free, scaption: 4/5, pain; 07/31/23: see note above, painful; 10/11/23:  flexion: 4/5 with pain, scaption: 4/5 with pain; Goal status: PARTIALLY MET  4. Pt will improve L shoulder AROM flexion and scaption to at least 110 degrees in order to demonstrate improvement in function so he can take care of his property and complete all household responsibilities;        Baseline: 02/26/23: Unable to test (90 degrees PROM for both), 05/28/23: L shoulder AROM against gravity: flexion: 176, no pain, scaption: 130, pain/burning; 07/31/23: see note; 10/12/23: flexion: 156 with pain, scaption: 84 with pain Goal status: PARTIALLY MET   PLAN: PT FREQUENCY: 1-2x/week  PT DURATION: 12 weeks  PLANNED INTERVENTIONS: Therapeutic exercises, Therapeutic activity, Neuromuscular re-education, Balance training, Gait training, Patient/Family education, Self Care, Joint mobilization, Joint manipulation, Vestibular training, Canalith repositioning, Orthotic/Fit training, DME instructions, Dry Needling, Electrical stimulation, Spinal manipulation, Spinal mobilization, Cryotherapy, Moist heat, Taping, Traction, Ultrasound, Ionotophoresis 4mg /ml Dexamethasone , Manual therapy, and Re-evaluation.  PLAN FOR NEXT SESSION:  progress HEP, progress L shoulder ROM and strength   Selinda BIRCH Yesennia Hirota PT, DPT, GCS  1:57 PM,11/19/23

## 2023-11-20 ENCOUNTER — Encounter: Admitting: Nurse Practitioner

## 2023-11-20 NOTE — Therapy (Incomplete)
 OUTPATIENT PHYSICAL THERAPY SHOULDER TREATMENT  Patient Name: OBERON HEHIR MRN: [id number] DOB:Jan 09, 1956, 68 y.o., male Today's Date: 11/20/2023    Past Medical History:  Diagnosis Date   (HFpEF) heart failure with preserved ejection fraction (HCC)    a.) TTE 12/26/2013: EF 45-50%, mild ant and antsept HK. mild MR. Mod dil LA. nl RV fxn. Rhythm was Afib; b.) TTE 07/04/2019: EF 55%, mid-apical anteroseptal HK, mild MAC, mild Ao sclerosis, G2DD, RVSP 45.3; c.) TTE 06/02/2022: EF 55-60%, no RWMAs, sev LAE, RVSP 37.9, mild-mod MR, mild AR, AoV sclerosis without stenosis, asc Ao 38 mm   Anxiety    Aortic atherosclerosis    Cardiomyopathy (in setting of Afib)    a.) TTE 12/26/2013: EF 45-50%; b.) TTE 07/04/2019: EF 55%; c.) TTE 06/02/2022: EF 55-60%   Chronic pain syndrome    a.) followed by pain management   Chronic, continuous use of opioids    a.) on COT; followed by pain management   Coronary artery disease 11/06/2014   a.) cCTA 11/06/2014: Ca2+ = 224 (74th %ile; LAD distribution)   Diverticulosis    DJD (degenerative joint disease) of knee    Former smoker    History of hiatal hernia    History of kidney stones 2012   Hyperlipidemia    Hyperplastic colon polyp    Hypertension    Inguinal hernia, left    Internal hemorrhoids    Intervertebral disc disorder with radiculopathy of lumbosacral region    Lesion of bone of lumbosacral spine    L5   Long term current use of amiodarone     Lower extremity edema    Mixed hyperlipidemia    Morbid obesity (HCC)    Multiple falls    Myalgia due to statin    On rivaroxaban  therapy    Osteoarthritis    PAF (paroxysmal atrial fibrillation) (HCC)    a.) CHA2DS2VASc = 4 (age, HTN, CHF, vascular disease) as of 02/19/23;  b.) s/p DCCV 03/13/14 (200 J), 05/15/19 (150 J x 1, 200 J x2), 05/19/2022 (150 J x1, 200 J x2; pads changed to ant/lat postion with additional 200 J x 2 -> did not convert), 07/25/22 (200 J), 09/21/2022 (200 J); c.) s/p PVI  ablation 11/17/2022; d.) rate/rhythm maintained on amiodarone ; chronically anticoagulated on rivaroxaban    Pernicious anemia    Prostate cancer (HCC)    Pulmonary nodule, right    a. 10/2014 Cardiac CTA: 7mm RLL nodule; b. 04/2015 CT Chest: stable 7mm RLL nodule. No new nodules; 02/2017 CTA Chest: stable, benign, 7mm RLL pulm nodule.   Rotator cuff tear, left    Sleep difficulties    a.) takes melatonin + trazodone  PRN   SVT (supraventricular tachycardia)    Tubular adenoma of colon    Past Surgical History:  Procedure Laterality Date   ATRIAL FIBRILLATION ABLATION N/A 11/17/2022   Procedure: ATRIAL FIBRILLATION ABLATION; Location: [facility]; Surgeon: Gehi, Anil, MD   BICEPT TENODESIS Right 09/27/2021   Procedure: Right reverse shoulder arthroplasty, biceps tenodesis;  Surgeon: Tobie Priest, MD;  Location: [facility];  Service: Orthopedics;  Laterality: Right;   CARDIOVERSION N/A 05/15/2019   Procedure: CARDIOVERSION;  Surgeon: Perla Evalene JINNY, MD;  Location: [facility];  Service: Cardiovascular;  Laterality: N/A;   CARDIOVERSION N/A 03/13/2014   Procedure: CARDIOVERSION; Location: [facility]; Surgeon: Evalene Perla, MD   CARDIOVERSION N/A 03/24/2022   Procedure: CARDIOVERSION;  Surgeon: Perla Evalene JINNY, MD;  Location: [facility];  Service: Cardiovascular;  Laterality: N/A;   CARDIOVERSION N/A 05/19/2022  Procedure: CARDIOVERSION;  Surgeon: Perla Evalene PARAS, MD;  Location: [facility];  Service: Cardiovascular;  Laterality: N/A;   CARDIOVERSION N/A 07/25/2022   Procedure: CARDIOVERSION;  Surgeon: Fernande Elspeth BROCKS, MD;  Location: [facility];  Service: Cardiovascular;  Laterality: N/A;   CARDIOVERSION N/A 09/21/2022   Procedure: CARDIOVERSION;  Surgeon: Fernande Elspeth BROCKS, MD;  Location: [facility];  Service: Cardiovascular;  Laterality: N/A;   CARPAL TUNNEL RELEASE Right 12/23/2014   Procedure: CARPAL TUNNEL RELEASE;  Surgeon: Kayla Pinal, MD;  Location: [facility];  Service: Orthopedics;  Laterality: Right;    CARPAL TUNNEL RELEASE Left 01/06/2015   Procedure: CARPAL TUNNEL RELEASE;  Surgeon: Kayla Pinal, MD;  Location: [facility];  Service: Orthopedics;  Laterality: Left;   COLONOSCOPY WITH PROPOFOL  N/A 10/08/2017   Procedure: COLONOSCOPY WITH PROPOFOL ;  Surgeon: Unk Corinn Skiff, MD;  Location: [facility];  Service: Gastroenterology;  Laterality: N/A;   COLONOSCOPY WITH PROPOFOL  N/A 06/07/2022   Procedure: COLONOSCOPY WITH PROPOFOL ;  Surgeon: Unk Corinn Skiff, MD;  Location[facility];  Service: Gastroenterology;  Laterality: N/A;   ENTEROSCOPY  06/07/2022   Procedure: ENTEROSCOPY;  Surgeon: Unk Corinn Skiff, MD;  Location: [facility] ENDOSCOPY;  Service: Gastroenterology;;   ESOPHAGOGASTRODUODENOSCOPY (EGD) WITH PROPOFOL  N/A 06/07/2022   Procedure: ESOPHAGOGASTRODUODENOSCOPY (EGD) WITH PROPOFOL ;  Surgeon: Unk Corinn Skiff, MD;  Location: [facility] ENDOSCOPY;  Service: Gastroenterology;  Laterality: N/A;   FLEXIBLE SIGMOIDOSCOPY N/A 11/02/2023   Procedure: KINGSTON SIDE;  Surgeon: Unk Corinn Skiff, MD;  Location: [facility];  Service: Gastroenterology;  Laterality: N/A;   GASTRIC BYPASS  01/17/2000   KNEE SURGERY Right    knee trauma x3   prostate seeding     REVERSE SHOULDER ARTHROPLASTY Right 09/27/2021   Procedure: Right reverse shoulder arthroplasty, biceps tenodesis;  Surgeon: Tobie Priest, MD;  Location: [facility];  Service: Orthopedics;  Laterality: Right;   REVERSE SHOULDER ARTHROPLASTY Left 02/20/2023   Procedure: Left reverse shoulder arthroplasty, biceps tenodesis;  Surgeon: Tobie Priest, MD;  Location: [facility];  Service: Orthopedics;  Laterality: Left;   SHOULDER ARTHROSCOPY WITH ROTATOR CUFF REPAIR AND OPEN BICEPS TENODESIS Left 12/27/2021   Procedure: Left shoulder arthroscopic cuff repair (supraspinatus and subscapularis) with Regeneten Patch application;  Surgeon: Tobie Priest, MD;  Location: [facility];  Service: Orthopedics;  Laterality: Left;   Patient  Active Problem List   Diagnosis Date Noted   Anticoagulated 09/14/2023   Acute diverticulitis 09/13/2023   Chronic diastolic CHF (congestive heart failure) (HCC) 09/13/2023   Symptomatic anemia 04/26/2022   Chronic pain of both knees 03/30/2022   Bilateral primary osteoarthritis of knee 03/30/2022   Shortness of breath 03/24/2022   Vitamin D  deficiency 02/24/2022   S/p reverse total shoulder arthroplasty 09/27/2021   Lesion of bone of lumbosacral spine (L5) 05/12/2021   Localized osteoarthritis of shoulder regions, bilateral 03/29/2021   Chronic pain of both shoulders 03/29/2021   Drug-induced myopathy 02/21/2021   Trigger finger of right hand 11/15/2020   Prostate cancer (HCC) 07/26/2020   Insomnia 08/01/2019   Primary osteoarthritis of both wrists 02/17/2019   Trigger middle finger of left hand 02/17/2019   Chronic pain syndrome 02/17/2019   Chronic radicular lumbar pain 10/08/2018   Lumbar radiculopathy 10/08/2018   Lumbar degenerative disc disease 10/08/2018   Lumbar facet arthropathy 10/08/2018   Lumbar facet joint syndrome 10/08/2018   Intervertebral disc disorder with radiculopathy of lumbosacral region    Osteoarthritis of knee (R>L) 11/30/2017   Osteoarthritis of wrist 11/30/2017   Pernicious anemia 05/22/2016  Paroxysmal atrial fibrillation 12/20/2015   Carpal tunnel syndrome 11/16/2014   DJD (degenerative joint disease) of knee 10/27/2014   Bilateral carpal tunnel syndrome 10/27/2014   Morbid obesity with BMI of 45.0-49.9, adult (HCC) 10/27/2014   Mixed hyperlipidemia 10/27/2014   H/O gastric bypass 03/20/2014   Hyperkalemia 03/20/2014   History of prostate cancer 12/26/2013   Essential hypertension 12/26/2013   Medication management 12/26/2013   BRBPR (bright red blood per rectum) 12/26/2013   PCP: Dr. Edman  REFERRING PROVIDER: Dr. Earnestine Blanch  REFERRING DIAG: [phone] (ICD-10-CM) - Complete rotator cuff tear or rupture of left shoulder, not specified  as traumatic   RATIONALE FOR EVALUATION AND TREATMENT: Rehabilitation  THERAPY DIAG: Chronic left shoulder pain  Muscle weakness (generalized)  ONSET DATE: 02/20/23  FOLLOW-UP APPT SCHEDULED WITH REFERRING PROVIDER: Yes   FROM INITIAL EVALUATION SUBJECTIVE:                                                                                                                                                                                         SUBJECTIVE STATEMENT:  L reverse TSR 02/20/23  PERTINENT HISTORY:  KAYCE BETTY is a 68 y.o. male who initially underwent arthroscopic subscapularis repair and side-to-side supraspinatus repair with Regeneten patch application by Dr. Blanch 12/27/2021. He underwent post-surgical physical therapy with initial minimal improvement followed by worsening shoulder strength/function. Repeat MRI showed large, retracted subscapularis re-tear and supraspinatus re-tear with signs of rotator cuff arthropathy. Conservative measures including medications, physical therapy, and cortisone injections did not provide adequate relief. The patient elected to proceed with reverse shoulder arthroplasty and biceps tenodesis on 02/20/23. Intraoperative findings include retracted full-thickness subscapularis tear, partial-thickness supraspinatus tear, and biceps tendinopathy. Surgery was a standard deltopectoral incision under general anesthesia along with an interscalene block. Per op note, operative arm to remain in sling at all times except ROM exercises and hygiene. Can perform pendulums, elbow/wrist/hand ROM exercises. Passive ROM allowed to 90 FF and 30 ER. ASA 325mg  x 6 weeks for DVT ppx (pt states that he was actually instructed to start back on his Xarelto  and not to use ASA). Plan for PT starting on POD #3-4. Pt denies any post-operative complications from surgery. Pt has a history of chronic back pain and has undergone multiple lumbar facet, medial branch radiofrequency ablations. He  also has a past history of cardiomyopathy (in setting of Afib), DJD (degenerative joint disease) of knee, kidney stones, lower extremity edema, PAF (paroxysmal atrial fibrillation), pernicious anemia, prostate cancer, and a pulmonary nodule. He also has a past surgical history that includes R reverse TSR, knee surgery, gastric bypass (2002), prostate seeding, carpal tunnel release (  Right, 12/23/2014; Left, 01/06/2015), and multiple cardioversions.   PAIN:  Pain Intensity: No resting pain upon arrival Pain location: L shoulder Pain Quality: Nerve pain Radiating: No  Numbness/Tingling: No Focal Weakness: Yes 24-hour pain behavior: Varies History of prior shoulder or neck/shoulder injury, pain, surgery, or therapy: Yes PT for L shoulder s/p RTC repair, history of R reverse TSR; Dominant hand: right Red flags Positive: history of prostate CA, Negative: chills/fever, night sweats, nausea, vomiting, unrelenting pain, unexplained weight gain/loss  PRECAUTIONS: Shoulder, per operative note: operative arm to remain in sling at all times except ROM exercises and hygiene. Can perform pendulums, elbow/wrist/hand ROM exercises. Passive ROM allowed to 90 FF and 30 ER.   General reverse TSR precautions include no combined shoulder extension, IR, and adduction. No shoulder and elbow AROM (bicep tenodesis) for at least the first 4 weeks   WEIGHT BEARING RESTRICTIONS: Yes no WB through LUE  FALLS: Has patient fallen in last 6 months? 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  Recent memory is intact.  Remote memory is intact.  Attention span and concentration are intact.  Expressive speech is intact.  Patient's fund of knowledge is within normal limits for educational level.    Gross Musculoskeletal Assessment Tremor: None Bulk: Normal Tone: Normal Incision is clean and dry with minimal blood staining on honeycomb dressing as well as minimal swelling around the incision. Skin is cool to touch and no signs of infection present.   Gait Deferred full gait assessment  Posture Forward head  with upper thoracic kyphosis and rounded shoulders  Cervical Screen Deferred  ROM ROM (Normal range in degrees)    Right (AROM) Left (PROM)  Shoulder    Flexion 160 90 (limited per protocol)  Extension    Abduction 180 90 (scaption)  External Rotation 88 0  Internal Rotation 70 stomach  Hands Behind Head    Hands Behind Back        Elbow    Flexion Full Full  Extension 0 0  Pronation WNL Full palm up  Supination WNL Full palm down  (* = pain; Blank rows = not tested)  UE MMT: MMT (out of 5) Right Left   Shoulder   Flexion 5   Extension    Abduction 5   External rotation 5   Internal rotation 5   Horizontal abduction    Horizontal adduction    Lower Trapezius    Rhomboids        Elbow  Flexion 5   Extension 5   Pronation 5   Supination 5       Wrist  Flexion 5 5  Extension 5 5  Radial deviation  5  Ulnar deviation  5      MCP  Flexion 5 5  Extension 5 5  Abduction 5 5  Adduction 5 5  (* = pain; Blank rows = not tested)  AROM AROM  Right Left  Shoulder    Flexion 180 160  Extension    Abduction 152 138  External Rotation 90 88  Internal Rotation 70 74  Hands Behind Head C6 C3  Hands Behind Back L1 Lateral  waistline  (* = pain; Blank rows = not tested)  07/31/23 UE MMT: MMT (out of 5) Right Left   Shoulder   Flexion 5 4*  Abduction 5 4*  External rotation (seated) 5 4*  Internal rotation (seated) 4+ 4+*  Horizontal abduction Unable to lay prone Unable to lay prone  Horizontal adduction    Lower Trapezius    Rhomboids        Elbow  Flexion 5 5  Extension 5 5  Pronation 5 5  Supination 5 5*   Shoulder Flexion  Peak Force (lb) Right 21.09 lb Left 10.19 lb Strength Difference 10.90 lb* Percentage Difference 69.66%*  Shoulder Abduction Peak Force (lb) Right 14.57 lb Left 8.33 lb Strength Difference 6.24 lb* Percentage Difference 54.48%*  Shoulder External Rotation Peak Force (lb) Right 20.28 lb Left 16.90 lb Strength Difference 3.37 lb* Percentage Difference 18.15%*  Shoulder Internal Rotation Peak Force (lb) Right 19.77 lb Left 20.43 lb Strength Difference 0.66 lb Percentage Difference 3.28%  10/11/23: AROM AROM     Right Left  Shoulder    Flexion  156  Extension    Abduction/Scaption  84*  External Rotation  50*  Internal Rotation  54*  Hands Behind Head    Hands Behind Back    (* = pain; Blank rows = not tested)  UE MMT: MMT (out of 5) Right Left   Shoulder   Flexion  4*  Abduction  4*  External rotation (seated)  4*  Internal rotation (seated)  4+*  Horizontal abduction  Unable to lay prone  Horizontal adduction    Lower Trapezius    Rhomboids        Elbow  Flexion  5  Extension  5  Pronation  5  Supination  5    TODAY'S TREATMENT: 11/20/2023    Subjective:  Pt states that he his doing alright today. He has been having worsening L shoulder pain over the last week. He rates his L shoulder pain as 6/10 today and he has already utilized pain medication prior to therapy. 6/10 L shoulder pain upon arrival.  He saw pain management who is going to order an MRI of his shoulder.   PAIN: 6/10;   Ther-ex  UBE x 2 minutes forward for LUE  strengthening and ROM; In semi-recumbent position: L shoulder flexion with 2# dumbbell 2 x 10; L shoulder scaption, unweighted, 2x 10; L shoulder overhead press with 2# DB 2 x 10; L shoulder manually resisted ER 2 x 10; L shoulder manually resisted IR 2 x 10; L elbow curl with with manual resistance 2 x 10; L elbow manually resisted extension 2 x 10; Dowel chest press with manual resistance from therapist 2 x 10; Dowel rows with manual resistance 2 x 10;   Not performed: L shoulder isometric extension 3s hold 2 x 10; L shoulder isometric abduction 3s hold x 10; L shoulder isometric abduction and extension 2 x 10 each; Nautilus rows with hand grips 50# 3 x 10; Seated pulleys for L shoulder AAROM overhead flexion and abduction x multiple bouts each direction; Seated W's with green tband 2 x 10; Seated shoulder rolls 2 x 10; Seated horizontal abduction with blue tband 2 x 10; Seated L shoulder extension with  blue tband 2 x 10;   HOME EXERCISE PROGRAM:  Access Code: [email or web address] URL: [email or web address] Date: 07/31/2023 Prepared by: Selinda Eck  Exercises - Seated Cervical Sidebending Stretch (Mirrored)  - 2 x daily - 7 x weekly - 3 reps - 30s hold - Seated Scapular Retraction  - 2 x daily - 7 x weekly - 2 sets - 10 reps - 3s hold - Standing Shoulder Flexion Full Range Single Arm  - 1 x daily - 7 x weekly - 2 sets - 10 reps - 3s hold - Standing Shoulder Abduction Full Range Single Arm  - 1 x daily - 7 x weekly - 2 sets - 10 reps - 3s hold - Wrist Flexion AROM  - 2 x daily - 7 x weekly - 2 sets - 10 reps - 3s hold - Wrist Extension AROM  - 1 x daily - 7 x weekly - 2 sets - 10 reps - 3s hold - Shoulder External Rotation and Scapular Retraction with Resistance  - 1 x daily - 7 x weekly - 2 sets - 10 reps - 3s hold    ASSESSMENT: Continued strengthening with patient during session today. Regressed to 2# dumbbells due to increasing levels of pain. Session abbreviated  due to high levels of pain. Plan to progress strengthening at future sessions as pain allows. Mr. Strozier would benefit from ongoing PT to prevent additional decline, regain L shoulder strength, and decrease pain. Pt encouraged to continue HEP and follow up as directed. He will continue to benefit from skilled therapy to address remaining deficits in L shoulder weakness and pain order to improve quality of life and return to PLOF.     OBJECTIVE IMPAIRMENTS: decreased ROM, decreased strength, and pain.   ACTIVITY LIMITATIONS: carrying, lifting, bathing, toileting, dressing, and reach over head  PARTICIPATION LIMITATIONS: meal prep, cleaning, laundry, driving, shopping, community activity, and yard work  PERSONAL FACTORS: Past/current experiences, Time since onset of injury/illness/exacerbation, and 3+ comorbidities: OA, anemia, anxiety, cardiomyopathy, chronic pain are also affecting patient's functional outcome.   REHAB POTENTIAL: Good  CLINICAL DECISION MAKING: Unstable/unpredictable  EVALUATION COMPLEXITY: High   GOALS: Goals reviewed with patient? Yes  SHORT TERM GOALS: Target date: 07/09/2023   Pt will be independent with HEP to improve strength and decrease shoulder pain to improve pain-free function at home and work. Baseline: Issued and progressing Goal status: ONGOING   LONG TERM GOALS: Target date: 08/20/2023  1.  Pt will report no further L shoulder pain with all AROM in order to complete all ADLs and improve pain-free function. Baseline: 05/28/23: worst pain 7/10; 07/31/23: 10/10; 10/12/23: 8/10; Goal status: ONGOING  2.  Pt will decrease QuickDASH score to below 30% in order to demonstrate clinically significant reduction in disability related to shoulder pain        Baseline: 02/26/23: 72.7%; 05/28/23: 40.9%; 07/31/23: 56.8%; 10/11/23: 59.1% Goal status: PARTIALLY MET  3. Pt will increase strength of pain-free L shoulder flexion and scaption to at least 4/5 in order to  demonstrate improvement in strength and function         Baseline: 02/26/23: Unable to test; 05/28/23: flexion: 4+/5, pain free, scaption: 4/5, pain; 07/31/23: see note above, painful; 10/11/23:  flexion: 4/5 with pain, scaption: 4/5 with pain; Goal status: PARTIALLY MET  4. Pt will improve L shoulder AROM flexion and scaption to at least 110 degrees in order to demonstrate improvement in function so he can take care of his property and complete all household responsibilities;  Baseline: 02/26/23: Unable to test (90 degrees PROM for both), 05/28/23: L shoulder AROM against gravity: flexion: 176, no pain, scaption: 130, pain/burning; 07/31/23: see note; 10/12/23: flexion: 156 with pain, scaption: 84 with pain Goal status: PARTIALLY MET   PLAN: PT FREQUENCY: 1-2x/week  PT DURATION: 12 weeks  PLANNED INTERVENTIONS: Therapeutic exercises, Therapeutic activity, Neuromuscular re-education, Balance training, Gait training, Patient/Family education, Self Care, Joint mobilization, Joint manipulation, Vestibular training, Canalith repositioning, Orthotic/Fit training, DME instructions, Dry Needling, Electrical stimulation, Spinal manipulation, Spinal mobilization, Cryotherapy, Moist heat, Taping, Traction, Ultrasound, Ionotophoresis 4mg /ml Dexamethasone , Manual therapy, and Re-evaluation.  PLAN FOR NEXT SESSION: progress HEP, progress L shoulder ROM and strength   Selinda BIRCH Jeniah Kishi PT, DPT, GCS  10:04 AM,11/20/23

## 2023-11-21 ENCOUNTER — Ambulatory Visit

## 2023-11-21 DIAGNOSIS — M6281 Muscle weakness (generalized): Secondary | ICD-10-CM

## 2023-11-21 DIAGNOSIS — G8929 Other chronic pain: Secondary | ICD-10-CM

## 2023-11-26 ENCOUNTER — Ambulatory Visit

## 2023-11-26 ENCOUNTER — Ambulatory Visit: Admitting: Physician Assistant

## 2023-11-26 ENCOUNTER — Telehealth: Payer: Self-pay

## 2023-11-26 ENCOUNTER — Ambulatory Visit
Admission: RE | Admit: 2023-11-26 | Discharge: 2023-11-26 | Disposition: A | Discharge status: Home / Self Care | Source: Ambulatory Visit | Attending: Physician Assistant | Admitting: Physician Assistant

## 2023-11-26 ENCOUNTER — Ambulatory Visit
Admission: RE | Admit: 2023-11-26 | Discharge: 2023-11-26 | Disposition: A | Discharge status: Home / Self Care | Attending: Physician Assistant | Admitting: Physician Assistant

## 2023-11-26 VITALS — BP 135/87 | HR 53 | Temp 98.5°F | Wt 350.0 lb

## 2023-11-26 DIAGNOSIS — M19011 Primary osteoarthritis, right shoulder: Secondary | ICD-10-CM | POA: Diagnosis present

## 2023-11-26 DIAGNOSIS — R310 Gross hematuria: Secondary | ICD-10-CM | POA: Insufficient documentation

## 2023-11-26 DIAGNOSIS — Z87442 Personal history of urinary calculi: Secondary | ICD-10-CM | POA: Insufficient documentation

## 2023-11-26 DIAGNOSIS — R109 Unspecified abdominal pain: Secondary | ICD-10-CM

## 2023-11-26 DIAGNOSIS — M19012 Primary osteoarthritis, left shoulder: Secondary | ICD-10-CM | POA: Insufficient documentation

## 2023-11-26 DIAGNOSIS — M170 Bilateral primary osteoarthritis of knee: Secondary | ICD-10-CM | POA: Insufficient documentation

## 2023-11-26 DIAGNOSIS — Z87440 Personal history of urinary (tract) infections: Secondary | ICD-10-CM

## 2023-11-26 LAB — URINALYSIS, COMPLETE
Glucose, UA: NEGATIVE
Ketones, UA: NEGATIVE
Leukocytes,UA: NEGATIVE
Nitrite, UA: NEGATIVE
Specific Gravity, UA: 1.02 (ref 1.005–1.030)
Urobilinogen, Ur: 0.2 mg/dL (ref 0.2–1.0)
pH, UA: 5 (ref 5.0–7.5)

## 2023-11-26 LAB — MICROSCOPIC EXAMINATION: RBC, Urine: >30 /HPF — AB (ref 0–2)

## 2023-11-26 NOTE — Telephone Encounter (Signed)
 Pt called in with blood in urine and back pain, some slight burning with urination. Pt concerned because he was recently put on a med from his gastro Dr. Sharman is known to cause kidney infections. I schedule patient for same day appt for patient and he is aware of needing to go get his X-ray done prior to seeing us  today. Pt voiced understanding.

## 2023-11-26 NOTE — Progress Notes (Unsigned)
 11/26/2023 11:41 AM   [name] September 13, 1955 [id number]  CC: Chief Complaint  Patient presents with   gross hematuria   HPI: [name] is a 68 y.o. male with PMH nephrolithiasis, prostate cancer s/p brachytherapy around 2014 with undetectable PSA, and UTI versus possible prostatitis who presents today for evaluation of possible UTI.   Today he reports several days of right flank pain that stopped 2 days ago.  He developed gross hematuria 3 days ago, which has been ongoing.  He denies fever, chills, nausea, or vomiting.  KUB today with no radiopaque urolithiasis.  In-office UA today positive for pH 5.0, 2+ protein, 3+ blood, and 1+ bilirubin; urine microscopy with >30 WBCs/HPF.  PMH: Past Medical History:  Diagnosis Date   (HFpEF) heart failure with preserved ejection fraction (HCC)    a.) TTE 12/26/2013: EF 45-50%, mild ant and antsept HK. mild MR. Mod dil LA. nl RV fxn. Rhythm was Afib; b.) TTE 07/04/2019: EF 55%, mid-apical anteroseptal HK, mild MAC, mild Ao sclerosis, G2DD, RVSP 45.3; c.) TTE 06/02/2022: EF 55-60%, no RWMAs, sev LAE, RVSP 37.9, mild-mod MR, mild AR, AoV sclerosis without stenosis, asc Ao 38 mm   Anxiety    Aortic atherosclerosis    Cardiomyopathy (in setting of Afib)    a.) TTE 12/26/2013: EF 45-50%; b.) TTE 07/04/2019: EF 55%; c.) TTE 06/02/2022: EF 55-60%   Chronic pain syndrome    a.) followed by pain management   Chronic, continuous use of opioids    a.) on COT; followed by pain management   Coronary artery disease 11/06/2014   a.) cCTA 11/06/2014: Ca2+ = 224 (74th %ile; LAD distribution)   Diverticulosis    DJD (degenerative joint disease) of knee    Former smoker    History of hiatal hernia    History of kidney stones 2012   Hyperlipidemia    Hyperplastic colon polyp    Hypertension    Inguinal hernia, left    Internal hemorrhoids    Intervertebral disc disorder with radiculopathy of lumbosacral region    Lesion of bone of lumbosacral  spine    L5   Long term current use of amiodarone     Lower extremity edema    Mixed hyperlipidemia    Morbid obesity (HCC)    Multiple falls    Myalgia due to statin    On rivaroxaban  therapy    Osteoarthritis    PAF (paroxysmal atrial fibrillation) (HCC)    a.) CHA2DS2VASc = 4 (age, HTN, CHF, vascular disease) as of 02/19/23;  b.) s/p DCCV 03/13/14 (200 J), 05/15/19 (150 J x 1, 200 J x2), 05/19/2022 (150 J x1, 200 J x2; pads changed to ant/lat postion with additional 200 J x 2 -> did not convert), 07/25/22 (200 J), 09/21/2022 (200 J); c.) s/p PVI ablation 11/17/2022; d.) rate/rhythm maintained on amiodarone ; chronically anticoagulated on rivaroxaban    Pernicious anemia    Prostate cancer (HCC)    Pulmonary nodule, right    a. 10/2014 Cardiac CTA: 7mm RLL nodule; b. 04/2015 CT Chest: stable 7mm RLL nodule. No new nodules; 02/2017 CTA Chest: stable, benign, 7mm RLL pulm nodule.   Rotator cuff tear, left    Sleep difficulties    a.) takes melatonin + trazodone  PRN   SVT (supraventricular tachycardia)    Tubular adenoma of colon     Surgical History: Past Surgical History:  Procedure Laterality Date   ATRIAL FIBRILLATION ABLATION N/A 11/17/2022   Procedure: ATRIAL FIBRILLATION ABLATION; Location: [facility]; Surgeon:  Gehi, Anil, MD   BICEPT TENODESIS Right 09/27/2021   Procedure: Right reverse shoulder arthroplasty, biceps tenodesis;  Surgeon: Tobie Priest, MD;  Location: [facility];  Service: Orthopedics;  Laterality: Right;   CARDIOVERSION N/A 05/15/2019   Procedure: CARDIOVERSION;  Surgeon: Perla Evalene PARAS, MD;  Location: [facility];  Service: Cardiovascular;  Laterality: N/A;   CARDIOVERSION N/A 03/13/2014   Procedure: CARDIOVERSION; Location: [facility]; Surgeon: Evalene Perla, MD   CARDIOVERSION N/A 03/24/2022   Procedure: CARDIOVERSION;  Surgeon: Perla Evalene PARAS, MD;  Location: [facility];  Service: Cardiovascular;  Laterality: N/A;   CARDIOVERSION N/A 05/19/2022   Procedure: CARDIOVERSION;   Surgeon: Perla Evalene PARAS, MD;  Location: [facility];  Service: Cardiovascular;  Laterality: N/A;   CARDIOVERSION N/A 07/25/2022   Procedure: CARDIOVERSION;  Surgeon: Fernande Elspeth BROCKS, MD;  Location: [facility];  Service: Cardiovascular;  Laterality: N/A;   CARDIOVERSION N/A 09/21/2022   Procedure: CARDIOVERSION;  Surgeon: Fernande Elspeth BROCKS, MD;  Location: [facility];  Service: Cardiovascular;  Laterality: N/A;   CARPAL TUNNEL RELEASE Right 12/23/2014   Procedure: CARPAL TUNNEL RELEASE;  Surgeon: Kayla Pinal, MD;  Location: [facility];  Service: Orthopedics;  Laterality: Right;   CARPAL TUNNEL RELEASE Left 01/06/2015   Procedure: CARPAL TUNNEL RELEASE;  Surgeon: Kayla Pinal, MD;  Location: [facility];  Service: Orthopedics;  Laterality: Left;   COLONOSCOPY WITH PROPOFOL  N/A 10/08/2017   Procedure: COLONOSCOPY WITH PROPOFOL ;  Surgeon: Unk Corinn Skiff, MD;  Location: [facility];  Service: Gastroenterology;  Laterality: N/A;   COLONOSCOPY WITH PROPOFOL  N/A 06/07/2022   Procedure: COLONOSCOPY WITH PROPOFOL ;  Surgeon: Unk Corinn Skiff, MD;  Location: [facility];  Service: Gastroenterology;  Laterality: N/A;   ENTEROSCOPY  06/07/2022   Procedure: ENTEROSCOPY;  Surgeon: Unk Corinn Skiff, MD;  Location: [facility] ENDOSCOPY;  Service: Gastroenterology;;   ESOPHAGOGASTRODUODENOSCOPY (EGD) WITH PROPOFOL  N/A 06/07/2022   Procedure: ESOPHAGOGASTRODUODENOSCOPY (EGD) WITH PROPOFOL ;  Surgeon: Unk Corinn Skiff, MD;  Location: [facility]COPY;  Service: Gastroenterology;  Laterality: N/A;   FLEXIBLE SIGMOIDOSCOPY N/A 11/02/2023   Procedure: KINGSTON SIDE;  Surgeon: Unk Corinn Skiff, MD;  Location: [facility];  Service: Gastroenterology;  Laterality: N/A;   GASTRIC BYPASS  01/17/2000   KNEE SURGERY Right    knee trauma x3   prostate seeding     REVERSE SHOULDER ARTHROPLASTY Right 09/27/2021   Procedure: Right reverse shoulder arthroplasty, biceps tenodesis;  Surgeon: Tobie Priest, MD;   Location: [facility];  Service: Orthopedics;  Laterality: Right;   REVERSE SHOULDER ARTHROPLASTY Left 02/20/2023   Procedure: Left reverse shoulder arthroplasty, biceps tenodesis;  Surgeon: Tobie Priest, MD;  Location: [facility];  Service: Orthopedics;  Laterality: Left;   SHOULDER ARTHROSCOPY WITH ROTATOR CUFF REPAIR AND OPEN BICEPS TENODESIS Left 12/27/2021   Procedure: Left shoulder arthroscopic cuff repair (supraspinatus and subscapularis) with Regeneten Patch application;  Surgeon: Tobie Priest, MD;  Location: [facility];  Service: Orthopedics;  Laterality: Left;    Home Medications:  Allergies as of 11/26/2023       Reactions   Bupivacaine  Liposome Palpitations   Pt was given this for shoulder surgery in 2023 and states heart was pounding        Medication List        Accurate as of November 26, 2023 11:41 AM. If you have any questions, ask your nurse or doctor.          STOP taking these medications    Xifaxan 550 MG Tabs tablet Generic drug: rifaximin Stopped by: Mike Berntsen  TAKE these medications    amLODipine  5 MG tablet Commonly known as: NORVASC  Take 1 tablet (5 mg total) by mouth daily.   atorvastatin  10 MG tablet Commonly known as: LIPITOR Take 5 mg by mouth at bedtime.   benazepril  40 MG tablet Commonly known as: LOTENSIN  TAKE ONE TABLET BY MOUTH ONE TIME DAILY   busPIRone  10 MG tablet Commonly known as: BUSPAR  TAKE ONE TABLET BY MOUTH FOUR TIMES A DAY AS NEEDED FOR ANIXETY   carboxymethylcellulose 0.5 % Soln Commonly known as: REFRESH PLUS Place 1-2 drops into both eyes 3 (three) times daily as needed (dry/irritated eyes.).   cholecalciferol 25 MCG (1000 UNIT) tablet Commonly known as: VITAMIN D3 Take 1,000 Units by mouth daily.   CITRACAL CALCIUM  +D3 PO   Creon 24000-76000 units Cpep Generic drug: Pancrelipase (Lip-Prot-Amyl) Take 2,400 Units by mouth 3 (three) times daily.   cyanocobalamin  1000 MCG/ML injection Commonly  known as: VITAMIN B12 Inject 1 mL (1,000 mcg total) into the skin every 30 (thirty) days.   cyclobenzaprine  10 MG tablet Commonly known as: FLEXERIL  Take 1 tablet (10 mg total) by mouth at bedtime.   DULoxetine  60 MG capsule Commonly known as: CYMBALTA  Take 1 capsule (60 mg total) by mouth daily.   FERROUS BISGLYCINATE CHELATE PO Take 25 mg by mouth daily. Bronson Iron  Bisglycinate 25 mg Gentle on The Stomach Supports Energy & Healthy Red Blood Cell Production   fluticasone  50 MCG/ACT nasal spray Commonly known as: FLONASE  Place 2 sprays into both nostrils daily. prn   HYDROcodone -acetaminophen  7.5-325 MG tablet Commonly known as: NORCO Take 1 tablet by mouth every 6 (six) hours as needed.   HYDROcodone -acetaminophen  7.5-325 MG tablet Commonly known as: NORCO Take 1 tablet by mouth every 6 (six) hours as needed. Start taking on: December 19, 2023   HYDROcodone -acetaminophen  7.5-325 MG tablet Commonly known as: NORCO Take 1 tablet by mouth every 6 (six) hours as needed. Start taking on: January 18, 2024   melatonin 3 MG Tabs tablet Take 3 mg by mouth at bedtime.   multivitamin with minerals Tabs tablet Take 1 tablet by mouth in the morning.   traZODone  150 MG tablet Commonly known as: DESYREL  TAKE ONE TABLET BY MOUTH AT BEDTIME   Xarelto  20 MG Tabs tablet Generic drug: rivaroxaban  TAKE ONE TABLET BY MOUTH ONE TIME DAILY WITH SUPPER        Allergies:  Allergies  Allergen Reactions   Bupivacaine  Liposome Palpitations    Pt was given this for shoulder surgery in 2023 and states heart was pounding    Family History: Family History  Problem Relation Age of Onset   Heart disease Father    Hypertension Father    Stroke Father    Cancer Father        prostate cancer   Cancer Sister    Arrhythmia Sister        A-fib   Arrhythmia Brother        A-fib   Cancer Brother     Social History:   reports that he quit smoking about 44 years ago. His smoking use  included cigarettes. He started smoking about 54 years ago. He has a 10 pack-year smoking history. He has quit using smokeless tobacco.  His smokeless tobacco use included snuff. He reports that he does not currently use alcohol . He reports that he does not use drugs.  Physical Exam: BP 135/87   Pulse (!) 53   Temp 98.5 F (36.9 C)   Wt (!)  350 lb (158.8 kg)   BMI 47.47 kg/m   Constitutional:  Alert and oriented, no acute distress, nontoxic appearing HEENT: [place], AT Cardiovascular: No clubbing, cyanosis, or edema Respiratory: Normal respiratory effort, no increased work of breathing Skin: No rashes, bruises or suspicious lesions Neurologic: Grossly intact, no focal deficits, moving all 4 extremities Psychiatric: Normal mood and affect  Laboratory Data: Results for orders placed or performed in visit on 11/26/23  Microscopic Examination   Collection Time: 11/26/23 10:49 AM   Urine  Result Value Ref Range   WBC, UA 0-5 0 - 5 /hpf   RBC, Urine >30 (A) 0 - 2 /hpf   Epithelial Cells (non renal) 0-10 0 - 10 /hpf   Crystals Present (A) N/A   Crystal Type Amorphous Sediment N/A   Mucus, UA Present (A) Not Estab.   Bacteria, UA Few None seen/Few  Urinalysis, Complete   Collection Time: 11/26/23 10:49 AM  Result Value Ref Range   Specific Gravity, UA 1.020 1.005 - 1.030   pH, UA 5.0 5.0 - 7.5   Color, UA Red (A) Yellow   Appearance Ur Cloudy (A) Clear   Leukocytes,UA Negative Negative   Protein,UA 2+ (A) Negative/Trace   Glucose, UA Negative Negative   Ketones, UA Negative Negative   RBC, UA 3+ (A) Negative   Bilirubin, UA Comment (A) Negative   Urobilinogen, Ur 0.2 0.2 - 1.0 mg/dL   Nitrite, UA Negative Negative   Microscopic Examination See below:   CULTURE, URINE COMPREHENSIVE   Collection Time: 11/26/23  1:06 PM   Specimen: Urine   UR  Result Value Ref Range   Urine Culture, Comprehensive Preliminary report (A)    Organism ID, Bacteria Comment    Organism ID, Bacteria  Gram negative rods (A)    *Note: Due to a large number of results and/or encounters for the requested time period, some results have not been displayed. A complete set of results can be found in Results Review.    Pertinent Imaging: KUB, 11/26/2023: CLINICAL DATA:  Hx stones.  Abdominal pain.  Hematuria.   EXAM: ABDOMEN - 1 VIEW   COMPARISON:  September 13, 2023   FINDINGS: Nonobstructive bowel gas pattern. Renal contours are obscured by overlapping bowel contents. No discrete nephrolithiasis are visualized.   Brachytherapy seeds. Degenerative changes of the lumbar spine. Surgical clips project over the LEFT hemiabdomen. Pelvic phleboliths. Sclerotic focus of the LEFT iliac bone, likely a bone island.   IMPRESSION: No discrete nephrolithiasis are visualized. If persistent concern, recommend dedicated CT abdomen pelvis without contrast.     Electronically Signed   By: Corean Salter M.D.   On: 11/28/2023 16:46  I personally reviewed the images referenced above and note no radiopaque urolithiasis.  Assessment & Plan:   1. Flank pain with history of urolithiasis (Primary) UA today notable for gross hematuria and low urine pH.  I strongly suspect a uric acid stone episode, so KUB cannot rule this out.  I recommended CT stone study for further evaluation and he agreed.  Will contact him with results when available. - Urinalysis, Complete - CULTURE, URINE COMPREHENSIVE - CT RENAL STONE STUDY; Future   Return for Will call with results.  Lucie Hones, PA-C  [facility] [address], [address] [place], [place] [address] [phone]

## 2023-11-26 NOTE — Patient Instructions (Signed)
 The imaging department will contact you to schedule your CT scan. Please send me a MyChart message when it has been completed so I can review the images and share your results with you. In the meantime, please do the following: -Stay well hydrated -Take Flomax  0.4mg  daily -Treat any pain with Advil (ibuprofen), Tylenol  (acetaminophen ), or Norco  Please call our office immediately (we are open 8a-5p Monday-Thursday and 8a-12p Friday) or go to the Emergency Department if you develop any of the following: -Fever/chills -Nausea and/or vomiting -Pain uncontrollable with Norco

## 2023-11-27 ENCOUNTER — Ambulatory Visit
Admission: RE | Admit: 2023-11-27 | Discharge: 2023-11-27 | Disposition: A | Discharge status: Home / Self Care | Source: Ambulatory Visit | Attending: Physician Assistant | Admitting: Physician Assistant

## 2023-11-27 ENCOUNTER — Ambulatory Visit
Admission: RE | Admit: 2023-11-27 | Discharge: 2023-11-27 | Disposition: A | Discharge status: Home / Self Care | Source: Ambulatory Visit | Attending: Nurse Practitioner | Admitting: Nurse Practitioner

## 2023-11-27 DIAGNOSIS — R109 Unspecified abdominal pain: Secondary | ICD-10-CM | POA: Diagnosis not present

## 2023-11-27 DIAGNOSIS — M170 Bilateral primary osteoarthritis of knee: Secondary | ICD-10-CM

## 2023-11-27 DIAGNOSIS — M19011 Primary osteoarthritis, right shoulder: Secondary | ICD-10-CM

## 2023-11-27 NOTE — Progress Notes (Unsigned)
 Electrophysiology Clinic Note    Date:  11/28/2023  Patient ID:  [name], [name] [birth year], MRN [id number] PCP:  Edman Marsa JINNY, DO  Cardiologist:  Evalene Lunger, MD  Electrophysiologist:  Elspeth Sage, MD (Inactive)  Electrophysiology APP:  Trellis Vanoverbeke, NP     Discussed the use of AI scribe software for clinical note transcription with the patient, who gave verbal consent to proceed.   Patient Profile    Chief Complaint: routine EP follow-up  History of Present Illness: [name] is a 68 y.o. male with PMH notable for persis Afib, HFmrEF, CAD, HTN ; seen today for Elspeth Sage, MD (Inactive) for routine electrophysiology followup.   He is s/p AF ablation with isolation of pulm veins and posterior wall on 11/2022 by Dr. Lorean at [facility].   I last saw him 05/2023 where he was not aware of any recent AF episodes, using watch and KardiaMobile to monitor. He requested to stop amiodarone  to limit off-target effects, so it was stopped.   On follow-up today, he began experiencing hematuria about 2 weeks ago, describes his urine as dark but no visible clots. He had CT scan yesterday, is not sure of urology's next step after CT Scan results.  He has had episodes of not feeling well and sweating, which are his classic AFib symptoms. He has several kardia mobile episodes recorded, all are inconclusive. He is having very brief SOB episodes that resolve quickly.  He received IV infusions for anemia throughout the summer, no further infusions planned.      Arrhythmia/Device History Amiodarone  - stopped 05/2023     ROS:  Please see the history of present illness. All other systems are reviewed and otherwise negative.    Physical Exam    VS:  BP 126/76 (BP Location: Left Arm, Patient Position: Sitting, Cuff Size: Normal)   Pulse 76   Ht 6' (1.829 m)   Wt (!) 354 lb 12.8 oz (160.9 kg)   SpO2 95%   BMI 48.12 kg/m  BMI: Body mass index is 48.12 kg/m.  Orthostatic  VS for the past 24 hrs (Last 3 readings):  BP- Lying Pulse- Lying BP- Sitting Pulse- Sitting BP- Standing at 0 minutes Pulse- Standing at 0 minutes BP- Standing at 3 minutes Pulse- Standing at 3 minutes  11/28/23 1443 117/79 96 110/77 82 123/82 80 123/85 95          Wt Readings from Last 3 Encounters:  11/28/23 (!) 354 lb 12.8 oz (160.9 kg)  11/26/23 (!) 350 lb (158.8 kg)  11/15/23 (!) 350 lb (158.8 kg)     GEN- The patient is well appearing, alert and oriented x 3 today.   Lungs- Clear to ausculation bilaterally, normal work of breathing.  Heart- Regular rate and rhythm, no murmurs, rubs or gallops Extremities- Trace peripheral edema, warm, dry   Studies Reviewed   Previous EP, cardiology notes.    EKG is ordered. Personal review of EKG from today shows:    EKG Interpretation Date/Time:  Wednesday November 28 2023 14:31:02 EST Ventricular Rate:  76 PR Interval:  196 QRS Duration:  84 QT Interval:  364 QTC Calculation: 409 R Axis:   51  Text Interpretation: 3:1 Atrial flutter Confirmed by Sinaya Minogue [phone]) on 11/28/2023 3:35:08 PM    Rhythm strip - atrial flutter  TTE, 06/03/2022  1. Left ventricular ejection fraction, by estimation, is 55 to 60%. Left ventricular ejection fraction by 2D MOD biplane is 58.3 %.  The left ventricle has normal function. The left ventricle has no regional wall motion abnormalities. Left ventricular diastolic parameters are indeterminate.   2. Right ventricular systolic function is normal. The right ventricular size is normal. There is mildly elevated pulmonary artery systolic pressure. The estimated right ventricular systolic pressure is 37.9 mmHg.   3. Left atrial size was severely dilated.   4. The mitral valve is normal in structure. Mild to moderate mitral valve regurgitation. No evidence of mitral stenosis.   5. The aortic valve has an indeterminant number of cusps. Aortic valve regurgitation is mild. Aortic valve sclerosis is present,  with no evidence of aortic valve stenosis.   6. There is borderline dilatation of the ascending aorta, measuring 38 mm.   7. The inferior vena cava is normal in size with greater than 50% respiratory variability, suggesting right atrial pressure of 3 mmHg.    TTE, 07/04/2019  1. Left ventricular ejection fraction, by estimation, is 55%. The left ventricle has low normal function. Mild hypokinesis of the mid to apical anteroseptal wall. Left ventricular diastolic parameters are consistent with Grade II diastolic dysfunction  (pseudonormalization).   2. Right ventricular systolic function is normal. The right ventricular size is normal. There is moderately elevated pulmonary artery systolic pressure.   3. Left atrial size was moderately dilated.   Comparison(s): Previous Echo showed LV EF 45-50%, mild anterior and anteroseptal HK, mild MR, moderate LAE.    Cardiac scoring CT, 11/06/2014 Coronary calcium  score of 224 (all in proximal LAD). This was 65 percentile for age and sex matched control.   Assessment and Plan     #) persis AFib #) aflutter S/p AF ablation 11/2022 at [facility] Now having atrial flutter, unclear duration of arrhythmia Ventricular rates well-controlled He is relatelively asymptomatic. Given unclear hematuria plan (see below), will not proceed with DCCV or AAD medications at this time 1 week monitor to assess for paroxysmal episodes Update BMP, mag today   #) Hypercoag d/t persis afib #) hematuria CHA2DS2-VASc Score = at least 4 [CHF History: 1, HTN History: 1, Diabetes History: 0, Stroke History: 0, Vascular Disease History: 1, Age Score: 1, Gender Score: 0].  Therefore, the patient's annual risk of stroke is 4.8 %.    Stroke ppx - 20mg  xarelto  daily, appropriately dosed He is having hematuria, with workup ongoing per urology, but anticipate pausing OAC for some duration.  Update CBC today  #) HTN Well-controlled Continue 5mg  amlodpine, 40mg  benazepril           Current medicines are reviewed at length with the patient today.   The patient has concerns regarding his medicines.  The following changes were made today:  none  Labs/ tests ordered today include:  Orders Placed This Encounter  Procedures   CBC   Basic metabolic panel with GFR   Magnesium   LONG TERM MONITOR (3-14 DAYS)   EKG 12-Lead     Disposition: Follow up with Dr. Kennyth or EP APP in 1 month    Signed, Chantal Needle, NP  11/28/23  3:47 PM  Electrophysiology [facility]

## 2023-11-28 ENCOUNTER — Encounter

## 2023-11-28 ENCOUNTER — Ambulatory Visit

## 2023-11-28 ENCOUNTER — Encounter: Payer: Self-pay | Admitting: Cardiology

## 2023-11-28 ENCOUNTER — Ambulatory Visit: Attending: Cardiology | Admitting: Cardiology

## 2023-11-28 ENCOUNTER — Encounter: Payer: Self-pay | Admitting: Nurse Practitioner

## 2023-11-28 ENCOUNTER — Telehealth: Payer: Self-pay

## 2023-11-28 VITALS — BP 126/76 | HR 76 | Ht 72.0 in | Wt 354.8 lb

## 2023-11-28 DIAGNOSIS — I10 Essential (primary) hypertension: Secondary | ICD-10-CM

## 2023-11-28 DIAGNOSIS — I4892 Unspecified atrial flutter: Secondary | ICD-10-CM

## 2023-11-28 DIAGNOSIS — I4819 Other persistent atrial fibrillation: Secondary | ICD-10-CM | POA: Diagnosis not present

## 2023-11-28 DIAGNOSIS — D6869 Other thrombophilia: Secondary | ICD-10-CM

## 2023-11-28 NOTE — Patient Instructions (Signed)
 Medication Instructions:  Your physician recommends that you continue on your current medications as directed. Please refer to the Current Medication list given to you today.   *If you need a refill on your cardiac medications before your next appointment, please call your pharmacy*  Lab Work: Your provider would like for you to have following labs drawn today CBC, BMP and Magnesium.   If you have labs (blood work) drawn today and your tests are completely normal, you will receive your results only by: MyChart Message (if you have MyChart) OR A paper copy in the mail If you have any lab test that is abnormal or we need to change your treatment, we will call you to review the results.  Testing/Procedures: Your physician has recommended that you wear a Zio monitor for 7 days.   This monitor is a medical device that records the heart's electrical activity. Doctors most often use these monitors to diagnose arrhythmias. Arrhythmias are problems with the speed or rhythm of the heartbeat. The monitor is a small device applied to your chest. You can wear one while you do your normal daily activities. While wearing this monitor if you have any symptoms to push the button and record what you felt. Once you have worn this monitor for the period of time provider prescribed (Usually 14 days), you will return the monitor device in the postage paid box. Once it is returned they will download the data collected and provide us  with a report which the provider will then review and we will call you with those results. Important tips:  Avoid showering during the first 24 hours of wearing the monitor. Avoid excessive sweating to help maximize wear time. Do not submerge the device, no hot tubs, and no swimming pools. Keep any lotions or oils away from the patch. After 24 hours you may shower with the patch on. Take brief showers with your back facing the shower head.  Do not remove patch once it has been placed  because that will interrupt data and decrease adhesive wear time. Push the button when you have any symptoms and write down what you were feeling. Once you have completed wearing your monitor, remove and place into box which has postage paid and place in your outgoing mailbox.  If for some reason you have misplaced your box then call our office and we can provide another box and/or mail it off for you.   Follow-Up: At [facility], you and your health needs are our priority.  As part of our continuing mission to provide you with exceptional heart care, our providers are all part of one team.  This team includes your primary Cardiologist (physician) and Advanced Practice Providers or APPs (Physician Assistants and Nurse Practitioners) who all work together to provide you with the care you need, when you need it.  Your next appointment:   1 month(s)  Provider:   Dr. Kennyth    We recommend signing up for the patient portal called MyChart.  Sign up information is provided on this After Visit Summary.  MyChart is used to connect with patients for Virtual Visits (Telemedicine).  Patients are able to view lab/test results, encounter notes, upcoming appointments, etc.  Non-urgent messages can be sent to your provider as well.   To learn more about what you can do with MyChart, go to [email or web address].

## 2023-11-28 NOTE — Telephone Encounter (Signed)
 Pt is requesting CT results and next steps.   Pt aware SV is in clinic seeing pts and will respond as quick as she can.

## 2023-11-29 ENCOUNTER — Ambulatory Visit: Payer: Self-pay | Admitting: Cardiology

## 2023-11-29 ENCOUNTER — Ambulatory Visit: Payer: Self-pay | Admitting: Physician Assistant

## 2023-11-29 ENCOUNTER — Other Ambulatory Visit: Payer: Self-pay

## 2023-11-29 DIAGNOSIS — R310 Gross hematuria: Secondary | ICD-10-CM

## 2023-11-29 LAB — CBC
Hematocrit: 44.4 % (ref 37.5–51.0)
Hemoglobin: 15.1 g/dL (ref 13.0–17.7)
MCH: 29.9 pg (ref 26.6–33.0)
MCHC: 34 g/dL (ref 31.5–35.7)
MCV: 88 fL (ref 79–97)
Platelets: 241 x10E3/uL (ref 150–450)
RBC: 5.05 x10E6/uL (ref 4.14–5.80)
RDW: 13.2 % (ref 11.6–15.4)
WBC: 8.9 x10E3/uL (ref 3.4–10.8)

## 2023-11-29 LAB — BASIC METABOLIC PANEL WITH GFR
BUN/Creatinine Ratio: 16 (ref 10–24)
BUN: 16 mg/dL (ref 8–27)
CO2: 23 mmol/L (ref 20–29)
Calcium: 9.3 mg/dL (ref 8.6–10.2)
Chloride: 105 mmol/L (ref 96–106)
Creatinine, Ser: 1.03 mg/dL (ref 0.76–1.27)
Glucose: 88 mg/dL (ref 70–99)
Potassium: 5.4 mmol/L — ABNORMAL HIGH (ref 3.5–5.2)
Sodium: 138 mmol/L (ref 134–144)
eGFR: 78 mL/min/1.73 (ref >59)

## 2023-11-29 LAB — MAGNESIUM: Magnesium: 2.2 mg/dL (ref 1.6–2.3)

## 2023-11-29 MED ORDER — SULFAMETHOXAZOLE-TRIMETHOPRIM 800-160 MG PO TABS: 1.0000 | ORAL_TABLET | Freq: Two times a day (BID) | ORAL | 0 refills | Status: AC

## 2023-11-29 NOTE — Telephone Encounter (Signed)
 Responded via MyChart.

## 2023-12-02 LAB — CULTURE, URINE COMPREHENSIVE

## 2023-12-05 ENCOUNTER — Encounter

## 2023-12-05 ENCOUNTER — Other Ambulatory Visit
Admission: RE | Admit: 2023-12-05 | Discharge: 2023-12-05 | Disposition: A | Discharge status: Home / Self Care | Source: Ambulatory Visit | Attending: Sports Medicine | Admitting: Sports Medicine

## 2023-12-05 DIAGNOSIS — M7552 Bursitis of left shoulder: Secondary | ICD-10-CM | POA: Diagnosis present

## 2023-12-05 LAB — SYNOVIAL CELL COUNT + DIFF, W/ CRYSTALS
Crystals, Fluid: NONE SEEN
Eosinophils-Synovial: 1 %
Lymphocytes-Synovial Fld: 59 %
Monocyte-Macrophage-Synovial Fluid: 32 %
Neutrophil, Synovial: 7 %
Other Cells-SYN: 1
WBC, Synovial: 585 /mm3 — ABNORMAL HIGH (ref 0–200)

## 2023-12-07 ENCOUNTER — Ambulatory Visit

## 2023-12-10 ENCOUNTER — Ambulatory Visit

## 2023-12-12 ENCOUNTER — Encounter

## 2023-12-13 DIAGNOSIS — I4892 Unspecified atrial flutter: Secondary | ICD-10-CM | POA: Diagnosis not present

## 2023-12-17 ENCOUNTER — Encounter

## 2023-12-17 ENCOUNTER — Other Ambulatory Visit

## 2023-12-19 ENCOUNTER — Other Ambulatory Visit

## 2023-12-19 ENCOUNTER — Encounter

## 2023-12-21 ENCOUNTER — Telehealth: Payer: Self-pay | Admitting: Cardiology

## 2023-12-21 NOTE — Telephone Encounter (Signed)
 Hi Suzann! I am helping in preop today. You saw this patient in November, recommended follow-up in 1 month to reassess. He is supposed to see you 2 days before this tendon surgery but I'd like to move this up by a few days to allow for adequate decision making and recommendations on holding anticoagulation if needed. I wasn't sure how your schedule works but you have openings at the end of next week - could you take a look and see where he would fit in? Thanks!  I will also route to the callback pool to discuss whether needed to hold anticoagulation, not mentioned in the clearance - patient is on Xarelto . If so, plan to route to pharm.

## 2023-12-21 NOTE — Telephone Encounter (Signed)
   Pre-operative Risk Assessment    Patient Name: [name]  DOB: Jan 28, 1955 MRN: [id number]   Date of last office visit: 11/28/23 Date of next office visit: 01/02/24   Request for Surgical Clearance    Procedure:  Tendon Sheath Incision (EG, for trigger finger)   Date of Surgery:  Clearance 01/04/24                                Surgeon:  Dr. Harry Socks Group or Practice Name:  [facility]  Phone number:  [phone]  Fax number:  [phone] ATTN Beth    Type of Clearance Requested:   - Medical    Type of Anesthesia:  MAC   Additional requests/questions:    [name] [name] [name]   12/21/2023, 4:35 PM

## 2023-12-21 NOTE — Telephone Encounter (Signed)
  Patient c/o Palpitations: STAT if patient c/o lightheadedness, shortness of breath, or chest pain  How long have you had palpitations/irregular HR/ Afib? Are you having the symptoms now? Afib   Are you currently experiencing lightheadedness, SOB or CP? No   Do you have a history of afib (atrial fibrillation) or irregular heart rhythm?  Yes   Have you checked your BP or HR? (document readings if available): 146/87 hr91, o2 96%   Are you experiencing any other symptoms? SOB with exertion    She ran basic labs and BNP today, results will be available via Care Everywhere     She asked if Suzann, NP is okay to clear pt for an upcoming surgery. I created another phone note to preop for this request.

## 2023-12-24 ENCOUNTER — Ambulatory Visit: Admitting: Urology

## 2023-12-24 ENCOUNTER — Encounter

## 2023-12-24 VITALS — BP 131/85 | HR 87 | Ht 72.0 in | Wt 350.0 lb

## 2023-12-24 DIAGNOSIS — D494 Neoplasm of unspecified behavior of bladder: Secondary | ICD-10-CM

## 2023-12-24 DIAGNOSIS — R310 Gross hematuria: Secondary | ICD-10-CM

## 2023-12-24 NOTE — Telephone Encounter (Signed)
 Pt has appt 01/02/24 Suzann Riddle, NP to address preop clearance at that time.

## 2023-12-25 ENCOUNTER — Encounter: Payer: Self-pay | Admitting: Urology

## 2023-12-25 ENCOUNTER — Telehealth (HOSPITAL_BASED_OUTPATIENT_CLINIC_OR_DEPARTMENT_OTHER): Payer: Self-pay | Admitting: *Deleted

## 2023-12-25 ENCOUNTER — Other Ambulatory Visit: Payer: Self-pay | Admitting: Urology

## 2023-12-25 ENCOUNTER — Other Ambulatory Visit: Payer: Self-pay

## 2023-12-25 ENCOUNTER — Ambulatory Visit

## 2023-12-25 ENCOUNTER — Telehealth: Payer: Self-pay

## 2023-12-25 DIAGNOSIS — D494 Neoplasm of unspecified behavior of bladder: Secondary | ICD-10-CM

## 2023-12-25 LAB — MICROSCOPIC EXAMINATION: RBC, Urine: >30 /HPF — AB (ref 0–2)

## 2023-12-25 LAB — URINALYSIS, COMPLETE
Bilirubin, UA: NEGATIVE
Glucose, UA: NEGATIVE
Ketones, UA: NEGATIVE
Leukocytes,UA: NEGATIVE
Nitrite, UA: NEGATIVE
Protein,UA: NEGATIVE
Specific Gravity, UA: 1.03 (ref 1.005–1.030)
Urobilinogen, Ur: 0.2 mg/dL (ref 0.2–1.0)
pH, UA: 5.5 (ref 5.0–7.5)

## 2023-12-25 NOTE — H&P (View-Only) (Signed)
 12/24/2023 8:21 AM   [name] 1955/11/26 [id number]  Referring provider: Edman Marsa JINNY, DO [address] [place],  [place] [address]  Chief Complaint  Patient presents with   Cysto    HPI: [name] is a 68 y.o. male with a history of recurrent gross hematuria who on cystoscopy was found to have a 2-3 cm right posterior wall papillary bladder tumor.  TURBT was recommended.  History of prostate cancer status post brachytherapy 2014   PMH: Past Medical History:  Diagnosis Date   (HFpEF) heart failure with preserved ejection fraction (HCC)    a.) TTE 12/26/2013: EF 45-50%, mild ant and antsept HK. mild MR. Mod dil LA. nl RV fxn. Rhythm was Afib; b.) TTE 07/04/2019: EF 55%, mid-apical anteroseptal HK, mild MAC, mild Ao sclerosis, G2DD, RVSP 45.3; c.) TTE 06/02/2022: EF 55-60%, no RWMAs, sev LAE, RVSP 37.9, mild-mod MR, mild AR, AoV sclerosis without stenosis, asc Ao 38 mm   Anxiety    Aortic atherosclerosis    Cardiomyopathy (in setting of Afib)    a.) TTE 12/26/2013: EF 45-50%; b.) TTE 07/04/2019: EF 55%; c.) TTE 06/02/2022: EF 55-60%   Chronic pain syndrome    a.) followed by pain management   Chronic, continuous use of opioids    a.) on COT; followed by pain management   Coronary artery disease 11/06/2014   a.) cCTA 11/06/2014: Ca2+ = 224 (74th %ile; LAD distribution)   Diverticulosis    DJD (degenerative joint disease) of knee    Former smoker    History of hiatal hernia    History of kidney stones 2012   Hyperlipidemia    Hyperplastic colon polyp    Hypertension    Inguinal hernia, left    Internal hemorrhoids    Intervertebral disc disorder with radiculopathy of lumbosacral region    Lesion of bone of lumbosacral spine    L5   Long term current use of amiodarone     Lower extremity edema    Mixed hyperlipidemia    Morbid obesity (HCC)    Multiple falls    Myalgia due to statin    On rivaroxaban  therapy    Osteoarthritis    PAF (paroxysmal atrial  fibrillation) (HCC)    a.) CHA2DS2VASc = 4 (age, HTN, CHF, vascular disease) as of 02/19/23;  b.) s/p DCCV 03/13/14 (200 J), 05/15/19 (150 J x 1, 200 J x2), 05/19/2022 (150 J x1, 200 J x2; pads changed to ant/lat postion with additional 200 J x 2 -> did not convert), 07/25/22 (200 J), 09/21/2022 (200 J); c.) s/p PVI ablation 11/17/2022; d.) rate/rhythm maintained on amiodarone ; chronically anticoagulated on rivaroxaban    Pernicious anemia    Prostate cancer (HCC)    Pulmonary nodule, right    a. 10/2014 Cardiac CTA: 7mm RLL nodule; b. 04/2015 CT Chest: stable 7mm RLL nodule. No new nodules; 02/2017 CTA Chest: stable, benign, 7mm RLL pulm nodule.   Rotator cuff tear, left    Sleep difficulties    a.) takes melatonin + trazodone  PRN   SVT (supraventricular tachycardia)    Tubular adenoma of colon     Surgical History: Past Surgical History:  Procedure Laterality Date   ATRIAL FIBRILLATION ABLATION N/A 11/17/2022   Procedure: ATRIAL FIBRILLATION ABLATION; Location: [facility]; Surgeon: Gehi, Anil, MD   BICEPT TENODESIS Right 09/27/2021   Procedure: Right reverse shoulder arthroplasty, biceps tenodesis;  Surgeon: Tobie Priest, MD;  Location: [facility];  Service: Orthopedics;  Laterality: Right;   CARDIOVERSION N/A  05/15/2019   Procedure: CARDIOVERSION;  Surgeon: Perla Evalene PARAS, MD;  Location: [facility];  Service: Cardiovascular;  Laterality: N/A;   CARDIOVERSION N/A 03/13/2014   Procedure: CARDIOVERSION; Location: [facility]; Surgeon: Evalene Perla, MD   CARDIOVERSION N/A 03/24/2022   Procedure: CARDIOVERSION;  Surgeon: Perla Evalene PARAS, MD;  Location: [facility];  Service: Cardiovascular;  Laterality: N/A;   CARDIOVERSION N/A 05/19/2022   Procedure: CARDIOVERSION;  Surgeon: Perla Evalene PARAS, MD;  Location: [facility];  Service: Cardiovascular;  Laterality: N/A;   CARDIOVERSION N/A 07/25/2022   Procedure: CARDIOVERSION;  Surgeon: Fernande Elspeth BROCKS, MD;  Location: [facility];  Service: Cardiovascular;   Laterality: N/A;   CARDIOVERSION N/A 09/21/2022   Procedure: CARDIOVERSION;  Surgeon: Fernande Elspeth BROCKS, MD;  Location: [facility];  Service: Cardiovascular;  Laterality: N/A;   CARPAL TUNNEL RELEASE Right 12/23/2014   Procedure: CARPAL TUNNEL RELEASE;  Surgeon: Kayla Pinal, MD;  Location: [facility];  Service: Orthopedics;  Laterality: Right;   CARPAL TUNNEL RELEASE Left 01/06/2015   Procedure: CARPAL TUNNEL RELEASE;  Surgeon: Kayla Pinal, MD;  Location: [facility];  Service: Orthopedics;  Laterality: Left;   COLONOSCOPY WITH PROPOFOL  N/A 10/08/2017   Procedure: COLONOSCOPY WITH PROPOFOL ;  Surgeon: Unk Corinn Skiff, MD;  Location: [facility];  Service: Gastroenterology;  Laterality: N/A;   COLONOSCOPY WITH PROPOFOL  N/A 06/07/2022   Procedure: COLONOSCOPY WITH PROPOFOL ;  Surgeon: Unk Corinn Skiff, MD;  Location: [facility];  Service: Gastroenterology;  Laterality: N/A;   ENTEROSCOPY  06/07/2022   Procedure: ENTEROSCOPY;  Surgeon: Unk Corinn Skiff, MD;  Location: [facility];  Service: Gastroenterology;;   ESOPHAGOGASTRODUODENOSCOPY (EGD) WITH PROPOFOL  N/A 06/07/2022   Procedure: ESOPHAGOGASTRODUODENOSCOPY (EGD) WITH PROPOFOL ;  Surgeon: Unk Corinn Skiff, MD;  Location: [facility];  Service: Gastroenterology;  Laterality: N/A;   FLEXIBLE SIGMOIDOSCOPY N/A 11/02/2023   Procedure: KINGSTON SIDE;  Surgeon: Unk Corinn Skiff, MD;  Location: [facility];  Service: Gastroenterology;  Laterality: N/A;   GASTRIC BYPASS  01/17/2000   KNEE SURGERY Right    knee trauma x3   prostate seeding     REVERSE SHOULDER ARTHROPLASTY Right 09/27/2021   Procedure: Right reverse shoulder arthroplasty, biceps tenodesis;  Surgeon: Tobie Priest, MD;  Location: [facility];  Service: Orthopedics;  Laterality: Right;   REVERSE SHOULDER ARTHROPLASTY Left 02/20/2023   Procedure: Left reverse shoulder arthroplasty, biceps tenodesis;  Surgeon: Tobie Priest, MD;  Location: [facility];  Service:  Orthopedics;  Laterality: Left;   SHOULDER ARTHROSCOPY WITH ROTATOR CUFF REPAIR AND OPEN BICEPS TENODESIS Left 12/27/2021   Procedure: Left shoulder arthroscopic cuff repair (supraspinatus and subscapularis) with Regeneten Patch application;  Surgeon: Tobie Priest, MD;  Location: [facility];  Service: Orthopedics;  Laterality: Left;    Home Medications:  Allergies as of 12/24/2023       Reactions   Bupivacaine  Liposome Palpitations   Pt was given this for shoulder surgery in 2023 and states heart was pounding        Medication List        Accurate as of December 24, 2023 11:59 PM. If you have any questions, ask your nurse or doctor.          STOP taking these medications    atorvastatin  10 MG tablet Commonly known as: LIPITOR Stopped by: Glendia BROCKS Barba       TAKE these medications    amLODipine  5 MG tablet Commonly known as: NORVASC  Take 1 tablet (5 mg total) by mouth daily.   benazepril  40 MG tablet Commonly known as:  LOTENSIN  TAKE ONE TABLET BY MOUTH ONE TIME DAILY   busPIRone  10 MG tablet Commonly known as: BUSPAR  TAKE ONE TABLET BY MOUTH FOUR TIMES A DAY AS NEEDED FOR ANIXETY   carboxymethylcellulose 0.5 % Soln Commonly known as: REFRESH PLUS Place 1-2 drops into both eyes 3 (three) times daily as needed (dry/irritated eyes.).   cholecalciferol 25 MCG (1000 UNIT) tablet Commonly known as: VITAMIN D3 Take 1,000 Units by mouth daily.   CITRACAL CALCIUM  +D3 PO   cyanocobalamin  1000 MCG/ML injection Commonly known as: VITAMIN B12 Inject 1 mL (1,000 mcg total) into the skin every 30 (thirty) days.   cyclobenzaprine  10 MG tablet Commonly known as: FLEXERIL  Take 1 tablet (10 mg total) by mouth at bedtime.   DULoxetine  60 MG capsule Commonly known as: CYMBALTA  Take 1 capsule (60 mg total) by mouth daily.   FERROUS BISGLYCINATE CHELATE PO Take 25 mg by mouth daily. Bronson Iron  Bisglycinate 25 mg Gentle on The Stomach Supports Energy & Healthy Red Blood  Cell Production   fluticasone  50 MCG/ACT nasal spray Commonly known as: FLONASE  Place 2 sprays into both nostrils daily. prn   HYDROcodone -acetaminophen  7.5-325 MG tablet Commonly known as: NORCO Take 1 tablet by mouth every 6 (six) hours as needed.   HYDROcodone -acetaminophen  7.5-325 MG tablet Commonly known as: NORCO Take 1 tablet by mouth every 6 (six) hours as needed. Start taking on: January 18, 2024   melatonin 3 MG Tabs tablet Take 3 mg by mouth at bedtime.   multivitamin with minerals Tabs tablet Take 1 tablet by mouth in the morning.   traZODone  150 MG tablet Commonly known as: DESYREL  TAKE ONE TABLET BY MOUTH AT BEDTIME   Xarelto  20 MG Tabs tablet Generic drug: rivaroxaban  TAKE ONE TABLET BY MOUTH ONE TIME DAILY WITH SUPPER        Allergies:  Allergies  Allergen Reactions   Bupivacaine  Liposome Palpitations    Pt was given this for shoulder surgery in 2023 and states heart was pounding    Family History: Family History  Problem Relation Age of Onset   Heart disease Father    Hypertension Father    Stroke Father    Cancer Father        prostate cancer   Cancer Sister    Arrhythmia Sister        A-fib   Arrhythmia Brother        A-fib   Cancer Brother     Social History:  reports that he quit smoking about 44 years ago. His smoking use included cigarettes. He started smoking about 54 years ago. He has a 10 pack-year smoking history. He has quit using smokeless tobacco.  His smokeless tobacco use included snuff. He reports that he does not currently use alcohol . He reports that he does not use drugs.   Physical Exam: BP 131/85   Pulse 87   Ht 6' (1.829 m)   Wt (!) 350 lb (158.8 kg)   BMI 47.47 kg/m   Constitutional:  Alert, No acute distress. HEENT: [place] AT Respiratory: Normal respiratory effort, no increased work of breathing. Psychiatric: Normal mood and affect.  Laboratory Data: Lab Results  Component Value Date   WBC 8.9 11/28/2023    HGB 15.1 11/28/2023   HCT 44.4 11/28/2023   MCV 88 11/28/2023   PLT 241 11/28/2023    Lab Results  Component Value Date   CREATININE 1.03 11/28/2023    Lab Results  Component Value Date   PSA 0.05 07/31/2023  PSA <0.04 07/27/2021   PSA 0.04 07/21/2020    Urinalysis 12/24/2023: >30 RBC  Assessment & Plan:    1. Bladder tumor Scheduled for TURBT.  The procedure has been discussed in detail as per today's cystoscopy note.  All questions were answered  2.  Gross hematuria Secondary to above   Glendia JAYSON Barba, MD  [facility] [address] [place], [place] [address] [phone]

## 2023-12-25 NOTE — Progress Notes (Signed)
  Phone Number: [phone] for Surgical Coordinator Fax Number: [phone]  REQUEST FOR SURGICAL CLEARANCE      Date: 12/25/2023  Faxed to: [facility]  Surgeon: Dr. Glendia Barba, MD     Date of Surgery: 01/14/2024  Operation: Transurethral Resection of Bladder Tumor with Intravesical Instillation of Gemcitabine   Anesthesia Type: General   Diagnosis: Bladder Tumor  Patient Requires:   Cardiac / Vascular Clearance : Yes  Reason: Would like for patient to hold Xarelto  prior to surgery   Risk Assessment:    Low   []       Moderate   []     High   []           This patient is optimized for surgery  YES []       NO   []    I recommend further assessment/workup prior to surgery. YES []      NO  []   Appointment scheduled for: _______________________   Further recommendations: ____________________________________     Physician Signature:__________________________________   Printed Name: ________________________________________   Date: _________________

## 2023-12-25 NOTE — Telephone Encounter (Signed)
   Pre-operative Risk Assessment    Patient Name: [name]  DOB: 01-04-1956 MRN: [id number]   Date of last office visit: 11/28/23 CHANTAL NEEDLE, NP Date of next office visit: 12/27/23 CHANTAL NEEDLE, NP   Request for Surgical Clearance    Procedure:  TRANSURETHRAL RESECTION OF BLADDER TUMOR WITH INTRAVESICAL  INSTILLATION OF GEMCITABINE  Date of Surgery:  Clearance 01/14/24                                Surgeon:  DR. GLENDIA STOIOFF Surgeon's Group or Practice Name:  [facility] Phone number:  [phone] ELEANOR SILVERSMITH, CMA Fax number:  [phone]   Type of Clearance Requested:   - Medical  - Pharmacy:  Hold Rivaroxaban  (Xarelto )     Type of Anesthesia:  General    Additional requests/questions:    Bonney Niels Jest   12/25/2023, 12:46 PM

## 2023-12-25 NOTE — Progress Notes (Signed)
 12/24/2023 8:21 AM   [name] 1955/11/26 [id number]  Referring provider: Edman Marsa JINNY, DO [address] [place],  [place] [address]  Chief Complaint  Patient presents with   Cysto    HPI: [name] is a 68 y.o. male with a history of recurrent gross hematuria who on cystoscopy was found to have a 2-3 cm right posterior wall papillary bladder tumor.  TURBT was recommended.  History of prostate cancer status post brachytherapy 2014   PMH: Past Medical History:  Diagnosis Date   (HFpEF) heart failure with preserved ejection fraction (HCC)    a.) TTE 12/26/2013: EF 45-50%, mild ant and antsept HK. mild MR. Mod dil LA. nl RV fxn. Rhythm was Afib; b.) TTE 07/04/2019: EF 55%, mid-apical anteroseptal HK, mild MAC, mild Ao sclerosis, G2DD, RVSP 45.3; c.) TTE 06/02/2022: EF 55-60%, no RWMAs, sev LAE, RVSP 37.9, mild-mod MR, mild AR, AoV sclerosis without stenosis, asc Ao 38 mm   Anxiety    Aortic atherosclerosis    Cardiomyopathy (in setting of Afib)    a.) TTE 12/26/2013: EF 45-50%; b.) TTE 07/04/2019: EF 55%; c.) TTE 06/02/2022: EF 55-60%   Chronic pain syndrome    a.) followed by pain management   Chronic, continuous use of opioids    a.) on COT; followed by pain management   Coronary artery disease 11/06/2014   a.) cCTA 11/06/2014: Ca2+ = 224 (74th %ile; LAD distribution)   Diverticulosis    DJD (degenerative joint disease) of knee    Former smoker    History of hiatal hernia    History of kidney stones 2012   Hyperlipidemia    Hyperplastic colon polyp    Hypertension    Inguinal hernia, left    Internal hemorrhoids    Intervertebral disc disorder with radiculopathy of lumbosacral region    Lesion of bone of lumbosacral spine    L5   Long term current use of amiodarone     Lower extremity edema    Mixed hyperlipidemia    Morbid obesity (HCC)    Multiple falls    Myalgia due to statin    On rivaroxaban  therapy    Osteoarthritis    PAF (paroxysmal atrial  fibrillation) (HCC)    a.) CHA2DS2VASc = 4 (age, HTN, CHF, vascular disease) as of 02/19/23;  b.) s/p DCCV 03/13/14 (200 J), 05/15/19 (150 J x 1, 200 J x2), 05/19/2022 (150 J x1, 200 J x2; pads changed to ant/lat postion with additional 200 J x 2 -> did not convert), 07/25/22 (200 J), 09/21/2022 (200 J); c.) s/p PVI ablation 11/17/2022; d.) rate/rhythm maintained on amiodarone ; chronically anticoagulated on rivaroxaban    Pernicious anemia    Prostate cancer (HCC)    Pulmonary nodule, right    a. 10/2014 Cardiac CTA: 7mm RLL nodule; b. 04/2015 CT Chest: stable 7mm RLL nodule. No new nodules; 02/2017 CTA Chest: stable, benign, 7mm RLL pulm nodule.   Rotator cuff tear, left    Sleep difficulties    a.) takes melatonin + trazodone  PRN   SVT (supraventricular tachycardia)    Tubular adenoma of colon     Surgical History: Past Surgical History:  Procedure Laterality Date   ATRIAL FIBRILLATION ABLATION N/A 11/17/2022   Procedure: ATRIAL FIBRILLATION ABLATION; Location: [facility]; Surgeon: Gehi, Anil, MD   BICEPT TENODESIS Right 09/27/2021   Procedure: Right reverse shoulder arthroplasty, biceps tenodesis;  Surgeon: Tobie Priest, MD;  Location: [facility];  Service: Orthopedics;  Laterality: Right;   CARDIOVERSION N/A  05/15/2019   Procedure: CARDIOVERSION;  Surgeon: Perla Evalene PARAS, MD;  Location: [facility];  Service: Cardiovascular;  Laterality: N/A;   CARDIOVERSION N/A 03/13/2014   Procedure: CARDIOVERSION; Location: [facility]; Surgeon: Evalene Perla, MD   CARDIOVERSION N/A 03/24/2022   Procedure: CARDIOVERSION;  Surgeon: Perla Evalene PARAS, MD;  Location: [facility];  Service: Cardiovascular;  Laterality: N/A;   CARDIOVERSION N/A 05/19/2022   Procedure: CARDIOVERSION;  Surgeon: Perla Evalene PARAS, MD;  Location: [facility];  Service: Cardiovascular;  Laterality: N/A;   CARDIOVERSION N/A 07/25/2022   Procedure: CARDIOVERSION;  Surgeon: Fernande Elspeth BROCKS, MD;  Location: [facility];  Service: Cardiovascular;   Laterality: N/A;   CARDIOVERSION N/A 09/21/2022   Procedure: CARDIOVERSION;  Surgeon: Fernande Elspeth BROCKS, MD;  Location: [facility];  Service: Cardiovascular;  Laterality: N/A;   CARPAL TUNNEL RELEASE Right 12/23/2014   Procedure: CARPAL TUNNEL RELEASE;  Surgeon: Kayla Pinal, MD;  Location: [facility];  Service: Orthopedics;  Laterality: Right;   CARPAL TUNNEL RELEASE Left 01/06/2015   Procedure: CARPAL TUNNEL RELEASE;  Surgeon: Kayla Pinal, MD;  Location: [facility];  Service: Orthopedics;  Laterality: Left;   COLONOSCOPY WITH PROPOFOL  N/A 10/08/2017   Procedure: COLONOSCOPY WITH PROPOFOL ;  Surgeon: Unk Corinn Skiff, MD;  Location: [facility];  Service: Gastroenterology;  Laterality: N/A;   COLONOSCOPY WITH PROPOFOL  N/A 06/07/2022   Procedure: COLONOSCOPY WITH PROPOFOL ;  Surgeon: Unk Corinn Skiff, MD;  Location: [facility]OSCOPY;  Service: Gastroenterology;  Laterality: N/A;   ENTEROSCOPY  06/07/2022   Procedure: ENTEROSCOPY;  Surgeon: Unk Corinn Skiff, MD;  Location: [facility];  Service: Gastroenterology;;   ESOPHAGOGASTRODUODENOSCOPY (EGD) WITH PROPOFOL  N/A 06/07/2022   Procedure: ESOPHAGOGASTRODUODENOSCOPY (EGD) WITH PROPOFOL ;  Surgeon: Unk Corinn Skiff, MD;  Location: [facility];  Service: Gastroenterology;  Laterality: N/A;   FLEXIBLE SIGMOIDOSCOPY N/A 11/02/2023   Procedure: KINGSTON SIDE;  Surgeon: Unk Corinn Skiff, MD;  Location: [facility];  Service: Gastroenterology;  Laterality: N/A;   GASTRIC BYPASS  01/17/2000   KNEE SURGERY Right    knee trauma x3   prostate seeding     REVERSE SHOULDER ARTHROPLASTY Right 09/27/2021   Procedure: Right reverse shoulder arthroplasty, biceps tenodesis;  Surgeon: Tobie Priest, MD;  Location: [facility];  Service: Orthopedics;  Laterality: Right;   REVERSE SHOULDER ARTHROPLASTY Left 02/20/2023   Procedure: Left reverse shoulder arthroplasty, biceps tenodesis;  Surgeon: Tobie Priest, MD;  Location: [facility];  Service:  Orthopedics;  Laterality: Left;   SHOULDER ARTHROSCOPY WITH ROTATOR CUFF REPAIR AND OPEN BICEPS TENODESIS Left 12/27/2021   Procedure: Left shoulder arthroscopic cuff repair (supraspinatus and subscapularis) with Regeneten Patch application;  Surgeon: Tobie Priest, MD;  Location: [facility];  Service: Orthopedics;  Laterality: Left;    Home Medications:  Allergies as of 12/24/2023       Reactions   Bupivacaine  Liposome Palpitations   Pt was given this for shoulder surgery in 2023 and states heart was pounding        Medication List        Accurate as of December 24, 2023 11:59 PM. If you have any questions, ask your nurse or doctor.          STOP taking these medications    atorvastatin  10 MG tablet Commonly known as: LIPITOR Stopped by: [name]       TAKE these medications    amLODipine  5 MG tablet Commonly known as: NORVASC  Take 1 tablet (5 mg total) by mouth daily.   benazepril  40 MG tablet Commonly known as:  LOTENSIN  TAKE ONE TABLET BY MOUTH ONE TIME DAILY   busPIRone  10 MG tablet Commonly known as: BUSPAR  TAKE ONE TABLET BY MOUTH FOUR TIMES A DAY AS NEEDED FOR ANIXETY   carboxymethylcellulose 0.5 % Soln Commonly known as: REFRESH PLUS Place 1-2 drops into both eyes 3 (three) times daily as needed (dry/irritated eyes.).   cholecalciferol 25 MCG (1000 UNIT) tablet Commonly known as: VITAMIN D3 Take 1,000 Units by mouth daily.   CITRACAL CALCIUM  +D3 PO   cyanocobalamin  1000 MCG/ML injection Commonly known as: VITAMIN B12 Inject 1 mL (1,000 mcg total) into the skin every 30 (thirty) days.   cyclobenzaprine  10 MG tablet Commonly known as: FLEXERIL  Take 1 tablet (10 mg total) by mouth at bedtime.   DULoxetine  60 MG capsule Commonly known as: CYMBALTA  Take 1 capsule (60 mg total) by mouth daily.   FERROUS BISGLYCINATE CHELATE PO Take 25 mg by mouth daily. Bronson Iron  Bisglycinate 25 mg Gentle on The Stomach Supports Energy & Healthy Red Blood  Cell Production   fluticasone  50 MCG/ACT nasal spray Commonly known as: FLONASE  Place 2 sprays into both nostrils daily. prn   HYDROcodone -acetaminophen  7.5-325 MG tablet Commonly known as: NORCO Take 1 tablet by mouth every 6 (six) hours as needed.   HYDROcodone -acetaminophen  7.5-325 MG tablet Commonly known as: NORCO Take 1 tablet by mouth every 6 (six) hours as needed. Start taking on: January 18, 2024   melatonin 3 MG Tabs tablet Take 3 mg by mouth at bedtime.   multivitamin with minerals Tabs tablet Take 1 tablet by mouth in the morning.   traZODone  150 MG tablet Commonly known as: DESYREL  TAKE ONE TABLET BY MOUTH AT BEDTIME   Xarelto  20 MG Tabs tablet Generic drug: rivaroxaban  TAKE ONE TABLET BY MOUTH ONE TIME DAILY WITH SUPPER        Allergies:  Allergies  Allergen Reactions   Bupivacaine  Liposome Palpitations    Pt was given this for shoulder surgery in 2023 and states heart was pounding    Family History: Family History  Problem Relation Age of Onset   Heart disease Father    Hypertension Father    Stroke Father    Cancer Father        prostate cancer   Cancer Sister    Arrhythmia Sister        A-fib   Arrhythmia Brother        A-fib   Cancer Brother     Social History:  reports that he quit smoking about 44 years ago. His smoking use included cigarettes. He started smoking about 54 years ago. He has a 10 pack-year smoking history. He has quit using smokeless tobacco.  His smokeless tobacco use included snuff. He reports that he does not currently use alcohol . He reports that he does not use drugs.   Physical Exam: BP 131/85   Pulse 87   Ht 6' (1.829 m)   Wt (!) 350 lb (158.8 kg)   BMI 47.47 kg/m   Constitutional:  Alert, No acute distress. HEENT: [place] AT Respiratory: Normal respiratory effort, no increased work of breathing. Psychiatric: Normal mood and affect.  Laboratory Data: Lab Results  Component Value Date   WBC 8.9 11/28/2023    HGB 15.1 11/28/2023   HCT 44.4 11/28/2023   MCV 88 11/28/2023   PLT 241 11/28/2023    Lab Results  Component Value Date   CREATININE 1.03 11/28/2023    Lab Results  Component Value Date   PSA 0.05 07/31/2023  PSA <0.04 07/27/2021   PSA 0.04 07/21/2020    Urinalysis 12/24/2023: >30 RBC  Assessment & Plan:    1. Bladder tumor Scheduled for TURBT.  The procedure has been discussed in detail as per today's cystoscopy note.  All questions were answered  2.  Gross hematuria Secondary to above   Wayne JAYSON Barba, MD  [facility] [address] [place], [place] [address] [phone]

## 2023-12-25 NOTE — Telephone Encounter (Signed)
 Per Dr. Twylla, [name] is to be scheduled for Transurethral Resection of Bladder Tumor with Intravesical Instillation of Gemcitabine   Mr. Colledge was contacted and possible surgical dates were discussed, Monday December 29th, 2025 was agreed upon for surgery.   [name] was instructed that Dr. Twylla will require them to provide a pre-op UA & CX prior to surgery. This was ordered and scheduled drop off appointment was made for 01/02/2024.    [name] was directed to call [phone] between 1-3pm the day before surgery to find out surgical arrival time.  Instructions were given not to eat or drink from midnight on the night before surgery and have a driver for the day of surgery. On the surgery day [name] was instructed to enter through the [facility] entrance of [facility] report the Same Day Surgery desk.   Pre-Admit Testing will be in contact via phone to set up an interview with the anesthesia team to review your history and medications prior to surgery.   Reminder of this information was sent via MyChart to the [name].

## 2023-12-25 NOTE — Progress Notes (Signed)
   [facility] Posting Form  Surgery Date: Date: 01/14/2024  Surgeon: Dr. Glendia Barba, MD  Inpt ( No  )   Outpt (Yes)   Obs ( No  )   Diagnosis: D49.4 Bladder Tumor  -CPT: 47764, [id number]  Surgery: Transurethral Resection of Bladder Tumor with Intravesical Instillation of Gemcitabine  Stop Anticoagulations: Yes, will need to hold Xarelto   Cardiac/Medical/Pulmonary Clearance needed: Yes  Clearance needed from Dr: [facility]  Clearance request sent on: Date: 12/25/23  *Orders entered into EPIC  Date: 12/25/23   *Case booked in EPIC  Date: 12/25/23  *Notified pt of Surgery: Date: 12/25/23  PRE-OP UA & CX: yes, will obtain in clinic on 01/02/2024  *Placed into Prior Authorization Work [place] Date: 12/25/23  Assistant/laser/rep:No

## 2023-12-25 NOTE — Telephone Encounter (Signed)
 Pt rescheduled to 12/11 /at 2:20PM with S. Riddle.

## 2023-12-25 NOTE — Telephone Encounter (Signed)
   Name: [name]  DOB: 30-Oct-1955  MRN: [id number]  Primary Cardiologist: Evalene Lunger, MD  Chart reviewed as part of pre-operative protocol coverage. The patient has an upcoming visit scheduled with Suzann Riddle, NP on 12/27/23 at which time clearance can be addressed in case there are any issues that would impact surgical recommendations.  TURBT is not scheduled until 01/14/24 as below. I added preop FYI to appointment note so that provider is aware to address at time of outpatient visit.  Per office protocol the cardiology provider should forward their finalized clearance decision and recommendations regarding antiplatelet therapy to the requesting party below.    This message will also be routed to pharmacy pool for input on holding Xarelto  as requested below so that this information is available to the clearing provider at time of patient's appointment.   I will route this message as FYI to requesting party and remove this message from the preop box as separate preop APP input not needed at this time.   Please call with any questions.  Reshonda Koerber D Malanie Koloski, NP  12/25/2023, 12:54 PM

## 2023-12-25 NOTE — Progress Notes (Signed)
   12/25/23  CC:  Chief Complaint  Patient presents with   Cysto    HPI: Initially seen 07/11/2023 for recurrent UTI.  History of prostate cancer status post brachytherapy 2014.  Recently saw [name] for recurrent gross hematuria.  Recent CT urogram showed no significant upper tract abnormalities.  No recent gross hematuria.  UA today >30 RBCs on microscopy  Blood pressure 131/85, pulse 87, height 6' (1.829 m), weight (!) 350 lb (158.8 kg).  Cystoscopy Procedure Note  Patient identification was confirmed, informed consent was obtained, and patient was prepped using Betadine solution.  Lidocaine  jelly was administered per urethral meatus.     Pre-Procedure: - Inspection reveals a normal caliber urethral meatus.  Procedure: The flexible cystoscope was introduced without difficulty - No urethral strictures/lesions are present. - Moderate lateral lobe enlargement prostate  - Mild elevation bladder neck - Bilateral ureteral orifices identified - Bladder mucosa  reveals 2-3 cm papillary tumor right posterior wall - No bladder stones - Mild trabeculation  Retroflexion shows no tumor or intravesical median lobe   Post-Procedure: - Patient tolerated the procedure well  Assessment/ Plan: Bladder tumor-findings were discussed in detail.  We discussed tumor is consistent with a superficial urothelial carcinoma Recommend TURBT.  The procedure was discussed in detail including potential risks of bleeding, infection/sepsis and bladder injury.  All questions were answered and he desires to proceed Recommend post resection intravesical gemcitabine    Wayne Lascala C Kennard Fildes, MD

## 2023-12-25 NOTE — Progress Notes (Signed)
 Surgical Physician Order Form [facility]  Dr. Glendia Barba, MD  * Scheduling expectation : Next Available  *Length of Case: 45 minutes  *Clearance needed: yes  *Anticoagulation Instructions: Hold all anticoagulants  *Aspirin  Instructions: N/A  *Post-op visit Date/Instructions:  1-2 week with pathology review  *Diagnosis: Bladder Tumor  *Procedure:  TURBT 2-5cm (47764)   Additional orders: Gemcitabine 2000mg  bladder instillation  -Admit type: OUTpatient  -Anesthesia: General  -VTE Prophylaxis Standing Order SCD's       Other:   -Standing Lab Orders Per Anesthesia    Lab other: Urine-ID [place]  -Standing Test orders EKG/Chest x-ray per Anesthesia       Test other:   - Medications: Ancef  3 g IV  -Other orders:  N/A

## 2023-12-26 ENCOUNTER — Ambulatory Visit

## 2023-12-26 ENCOUNTER — Ambulatory Visit: Admitting: Internal Medicine

## 2023-12-26 ENCOUNTER — Inpatient Hospital Stay: Attending: Internal Medicine

## 2023-12-26 ENCOUNTER — Encounter: Payer: Self-pay | Admitting: Internal Medicine

## 2023-12-26 VITALS — BP 131/92 | HR 90 | Temp 98.3°F | Resp 17

## 2023-12-26 DIAGNOSIS — E669 Obesity, unspecified: Secondary | ICD-10-CM | POA: Insufficient documentation

## 2023-12-26 DIAGNOSIS — D649 Anemia, unspecified: Secondary | ICD-10-CM | POA: Diagnosis present

## 2023-12-26 DIAGNOSIS — R5383 Other fatigue: Secondary | ICD-10-CM | POA: Diagnosis not present

## 2023-12-26 DIAGNOSIS — Z9884 Bariatric surgery status: Secondary | ICD-10-CM | POA: Diagnosis not present

## 2023-12-26 DIAGNOSIS — Z7901 Long term (current) use of anticoagulants: Secondary | ICD-10-CM | POA: Insufficient documentation

## 2023-12-26 DIAGNOSIS — C61 Malignant neoplasm of prostate: Secondary | ICD-10-CM | POA: Diagnosis not present

## 2023-12-26 DIAGNOSIS — I4891 Unspecified atrial fibrillation: Secondary | ICD-10-CM | POA: Diagnosis not present

## 2023-12-26 DIAGNOSIS — E538 Deficiency of other specified B group vitamins: Secondary | ICD-10-CM | POA: Insufficient documentation

## 2023-12-26 DIAGNOSIS — I2510 Atherosclerotic heart disease of native coronary artery without angina pectoris: Secondary | ICD-10-CM | POA: Diagnosis not present

## 2023-12-26 DIAGNOSIS — Z87891 Personal history of nicotine dependence: Secondary | ICD-10-CM | POA: Insufficient documentation

## 2023-12-26 LAB — BASIC METABOLIC PANEL - CANCER CENTER ONLY
Anion gap: 12 (ref 5–15)
BUN: 21 mg/dL (ref 8–23)
CO2: 23 mmol/L (ref 22–32)
Calcium: 9.4 mg/dL (ref 8.9–10.3)
Chloride: 106 mmol/L (ref 98–111)
Creatinine: 1.05 mg/dL (ref 0.61–1.24)
GFR, Estimated: >60 mL/min (ref >60)
Glucose, Bld: 104 mg/dL — ABNORMAL HIGH (ref 70–99)
Potassium: 4.5 mmol/L (ref 3.5–5.1)
Sodium: 141 mmol/L (ref 135–145)

## 2023-12-26 LAB — CBC WITH DIFFERENTIAL (CANCER CENTER ONLY)
Abs Immature Granulocytes: 0.04 K/uL (ref 0.00–0.07)
Basophils Absolute: 0 K/uL (ref 0.0–0.1)
Basophils Relative: 1 %
Eosinophils Absolute: 0.1 K/uL (ref 0.0–0.5)
Eosinophils Relative: 1 %
HCT: 44.9 % (ref 39.0–52.0)
Hemoglobin: 14.9 g/dL (ref 13.0–17.0)
Immature Granulocytes: 1 %
Lymphocytes Relative: 27 %
Lymphs Abs: 2 K/uL (ref 0.7–4.0)
MCH: 28.9 pg (ref 26.0–34.0)
MCHC: 33.2 g/dL (ref 30.0–36.0)
MCV: 87 fL (ref 80.0–100.0)
Monocytes Absolute: 0.8 K/uL (ref 0.1–1.0)
Monocytes Relative: 11 %
Neutro Abs: 4.5 K/uL (ref 1.7–7.7)
Neutrophils Relative %: 59 %
Platelet Count: 195 K/uL (ref 150–400)
RBC: 5.16 MIL/uL (ref 4.22–5.81)
RDW: 13.1 % (ref 11.5–15.5)
WBC Count: 7.5 K/uL (ref 4.0–10.5)
nRBC: 0 % (ref 0.0–0.2)

## 2023-12-26 LAB — FERRITIN: Ferritin: 101 ng/mL (ref 24–336)

## 2023-12-26 LAB — IRON AND TIBC
Iron: 59 ug/dL (ref 45–182)
Saturation Ratios: 18 % (ref 17.9–39.5)
TIBC: 328 ug/dL (ref 250–450)
UIBC: 269 ug/dL

## 2023-12-26 MED ORDER — SODIUM CHLORIDE 0.9% FLUSH
10.0000 mL | Freq: Once | INTRAVENOUS | Status: AC | PRN
  Administered 2023-12-26: 10 mL
  Filled 2023-12-26: qty 10

## 2023-12-26 MED ORDER — IRON SUCROSE 20 MG/ML IV SOLN
200.0000 mg | Freq: Once | INTRAVENOUS | Status: AC
  Administered 2023-12-26: 200 mg via INTRAVENOUS
  Filled 2023-12-26: qty 10

## 2023-12-26 NOTE — Progress Notes (Signed)
 [name]  Patient Care Team: Edman Marsa PARAS, DO as PCP - General (Family Medicine) Perla Evalene PARAS, MD as PCP - Cardiology (Cardiology) Fernande Elspeth BROCKS, MD (Inactive) as PCP - Electrophysiology (Cardiology) Rennie Cindy SAUNDERS, MD as Consulting Physician (Oncology) Riddle, Suzann, NP as Nurse Practitioner (Clinical Cardiac Electrophysiology)  CHIEF COMPLAINTS/PURPOSE OF CONSULTATION: ANEMIA   HEMATOLOGY HISTORY  # ANEMIA[Hb; MCV-platelets- WBC; Iron  sat; ferritin;  GFR- CT/US - ;  EGD- none/colonoscopy-2019 [Dr.Vanga]  # 2005- Gastric by pass [BL-400; 233; [facility]]  # Prostate cancer [seed implant]- no surgery/ Dr.Wolfe.    Latest Reference Range & Units 03/22/22 11:59  WBC 4.0 - 10.5 K/uL 7.4  RBC 4.22 - 5.81 MIL/uL 5.03  Hemoglobin 13.0 - 17.0 g/dL 89.6 (L)  HCT 60.9 - 47.9 % 35.1 (L)  MCV 80.0 - 100.0 fL 69.8 (L)  MCH 26.0 - 34.0 pg 20.5 (L)  MCHC 30.0 - 36.0 g/dL 70.6 (L)  RDW 88.4 - 84.4 % 17.8 (H)  Platelets 150 - 400 K/uL 250  (L): Data is abnormally low (H): Data is abnormally high   HISTORY OF PRESENTING ILLNESS: Patient ambulating-independently.  Alone.   [name] 68 y.o.  male pleasant patient with Hx of prostate cancer; and history of symptomatic anemia s/p gastric bypass here for follow-up of anemia.  Discussed the use of AI scribe software for clinical note transcription with the patient, who gave verbal consent to proceed.  History of Present Illness   [name] is a 68 year old male with symptomatic anemia following prior gastric bypass who presents for hematology follow-up and consideration of iron  infusion.  He has persistent anemia managed with oral iron  supplementation and periodic intravenous iron  infusions, most recently administered in September. He reports improved energy and overall well-being with monthly iron  infusions. He is currently taking a multivitamin and over-the-counter iron  supplement as  directed.  Recent laboratory studies demonstrate stable hemoglobin levels between 13-15 g/dL. Potassium levels have normalized per recent testing with his family physician. Iron  studies from today are pending.  He has a history of atrial fibrillation and recently experienced recurrent arrhythmia, including documented atrial flutter on ambulatory monitoring last week. He describes significant fatigue during arrhythmic episodes and is scheduled for cardiology evaluation, including EKG, tomorrow.  He is scheduled for hand surgery to correct finger contractures and for resection of superficial bladder cancer on December 29th.       Review of Systems  Constitutional:  Positive for malaise/fatigue. Negative for chills, diaphoresis, fever and weight loss.  HENT:  Negative for nosebleeds and sore throat.   Eyes:  Negative for double vision.  Respiratory:  Positive for shortness of breath. Negative for cough, hemoptysis, sputum production and wheezing.   Cardiovascular:  Negative for chest pain, palpitations, orthopnea and leg swelling.  Gastrointestinal:  Positive for blood in stool and diarrhea. Negative for abdominal pain, constipation, heartburn, melena, nausea and vomiting.  Genitourinary:  Negative for dysuria, frequency and urgency.  Musculoskeletal:  Positive for back pain and joint pain.  Skin: Negative.  Negative for itching and rash.  Neurological:  Negative for dizziness, tingling, focal weakness, weakness and headaches.  Endo/Heme/Allergies:  Does not bruise/bleed easily.  Psychiatric/Behavioral:  Negative for depression. The patient is not nervous/anxious and does not have insomnia.     MEDICAL HISTORY:  Past Medical History:  Diagnosis Date   (HFpEF) heart failure with preserved ejection fraction (HCC)    a.) TTE 12/26/2013: EF 45-50%, mild ant and antsept HK.  mild MR. Mod dil LA. nl RV fxn. Rhythm was Afib; b.) TTE 07/04/2019: EF 55%, mid-apical anteroseptal HK, mild MAC, mild Ao  sclerosis, G2DD, RVSP 45.3; c.) TTE 06/02/2022: EF 55-60%, no RWMAs, sev LAE, RVSP 37.9, mild-mod MR, mild AR, AoV sclerosis without stenosis, asc Ao 38 mm   Anxiety    Aortic atherosclerosis    Cardiomyopathy (in setting of Afib)    a.) TTE 12/26/2013: EF 45-50%; b.) TTE 07/04/2019: EF 55%; c.) TTE 06/02/2022: EF 55-60%   Chronic pain syndrome    a.) followed by pain management   Chronic, continuous use of opioids    a.) on COT; followed by pain management   Coronary artery disease 11/06/2014   a.) cCTA 11/06/2014: Ca2+ = 224 (74th %ile; LAD distribution)   Diverticulosis    DJD (degenerative joint disease) of knee    Former smoker    History of hiatal hernia    History of kidney stones 2012   Hyperlipidemia    Hyperplastic colon polyp    Hypertension    Inguinal hernia, left    Internal hemorrhoids    Intervertebral disc disorder with radiculopathy of lumbosacral region    Lesion of bone of lumbosacral spine    L5   Long term current use of amiodarone     Lower extremity edema    Mixed hyperlipidemia    Morbid obesity (HCC)    Multiple falls    Myalgia due to statin    On rivaroxaban  therapy    Osteoarthritis    PAF (paroxysmal atrial fibrillation) (HCC)    a.) CHA2DS2VASc = 4 (age, HTN, CHF, vascular disease) as of 02/19/23;  b.) s/p DCCV 03/13/14 (200 J), 05/15/19 (150 J x 1, 200 J x2), 05/19/2022 (150 J x1, 200 J x2; pads changed to ant/lat postion with additional 200 J x 2 -> did not convert), 07/25/22 (200 J), 09/21/2022 (200 J); c.) s/p PVI ablation 11/17/2022; d.) rate/rhythm maintained on amiodarone ; chronically anticoagulated on rivaroxaban    Pernicious anemia    Prostate cancer (HCC)    Pulmonary nodule, right    a. 10/2014 Cardiac CTA: 7mm RLL nodule; b. 04/2015 CT Chest: stable 7mm RLL nodule. No new nodules; 02/2017 CTA Chest: stable, benign, 7mm RLL pulm nodule.   Rotator cuff tear, left    Sleep difficulties    a.) takes melatonin + trazodone  PRN   SVT  (supraventricular tachycardia)    Tubular adenoma of colon     SURGICAL HISTORY: Past Surgical History:  Procedure Laterality Date   ATRIAL FIBRILLATION ABLATION N/A 11/17/2022   Procedure: ATRIAL FIBRILLATION ABLATION; Location: [facility]; Surgeon: Gehi, Anil, MD   BICEPT TENODESIS Right 09/27/2021   Procedure: Right reverse shoulder arthroplasty, biceps tenodesis;  Surgeon: Tobie Priest, MD;  Location: [facility];  Service: Orthopedics;  Laterality: Right;   CARDIOVERSION N/A 05/15/2019   Procedure: CARDIOVERSION;  Surgeon: Perla Evalene PARAS, MD;  Location: [facility];  Service: Cardiovascular;  Laterality: N/A;   CARDIOVERSION N/A 03/13/2014   Procedure: CARDIOVERSION; Location: [facility]; Surgeon: Evalene Perla, MD   CARDIOVERSION N/A 03/24/2022   Procedure: CARDIOVERSION;  Surgeon: Perla Evalene PARAS, MD;  Location: [facility];  Service: Cardiovascular;  Laterality: N/A;   CARDIOVERSION N/A 05/19/2022   Procedure: CARDIOVERSION;  Surgeon: Perla Evalene PARAS, MD;  Location: [facility];  Service: Cardiovascular;  Laterality: N/A;   CARDIOVERSION N/A 07/25/2022   Procedure: CARDIOVERSION;  Surgeon: Fernande Elspeth BROCKS, MD;  Location: [facility];  Service: Cardiovascular;  Laterality: N/A;   CARDIOVERSION N/A  09/21/2022   Procedure: CARDIOVERSION;  Surgeon: Fernande Elspeth BROCKS, MD;  Location: [facility];  Service: Cardiovascular;  Laterality: N/A;   CARPAL TUNNEL RELEASE Right 12/23/2014   Procedure: CARPAL TUNNEL RELEASE;  Surgeon: Kayla Pinal, MD;  Location: [facility];  Service: Orthopedics;  Laterality: Right;   CARPAL TUNNEL RELEASE Left 01/06/2015   Procedure: CARPAL TUNNEL RELEASE;  Surgeon: Kayla Pinal, MD;  Location: [facility];  Service: Orthopedics;  Laterality: Left;   COLONOSCOPY WITH PROPOFOL  N/A 10/08/2017   Procedure: COLONOSCOPY WITH PROPOFOL ;  Surgeon: Unk Corinn Skiff, MD;  Location: [facility];  Service: Gastroenterology;  Laterality: N/A;   COLONOSCOPY WITH PROPOFOL  N/A 06/07/2022    Procedure: COLONOSCOPY WITH PROPOFOL ;  Surgeon: Unk Corinn Skiff, MD;  Location: [facility] ENDOSCOPY;  Service: Gastroenterology;  Laterality: N/A;   ENTEROSCOPY  06/07/2022   Procedure: ENTEROSCOPY;  Surgeon: Unk Corinn Skiff, MD;  Location: [facility] ENDOSCOPY;  Service: Gastroenterology;;   ESOPHAGOGASTRODUODENOSCOPY (EGD) WITH PROPOFOL  N/A 06/07/2022   Procedure: ESOPHAGOGASTRODUODENOSCOPY (EGD) WITH PROPOFOL ;  Surgeon: Unk Corinn Skiff, MD;  Location: [facility] ENDOSCOPY;  Service: Gastroenterology;  Laterality: N/A;   FLEXIBLE SIGMOIDOSCOPY N/A 11/02/2023   Procedure: KINGSTON SIDE;  Surgeon: Unk Corinn Skiff, MD;  Location: [facility] ENDOSCOPY;  Service: Gastroenterology;  Laterality: N/A;   GASTRIC BYPASS  01/17/2000   KNEE SURGERY Right    knee trauma x3   prostate seeding     REVERSE SHOULDER ARTHROPLASTY Right 09/27/2021   Procedure: Right reverse shoulder arthroplasty, biceps tenodesis;  Surgeon: Tobie Priest, MD;  Location: [facility];  Service: Orthopedics;  Laterality: Right;   REVERSE SHOULDER ARTHROPLASTY Left 02/20/2023   Procedure: Left reverse shoulder arthroplasty, biceps tenodesis;  Surgeon: Tobie Priest, MD;  Location: [facility];  Service: Orthopedics;  Laterality: Left;   SHOULDER ARTHROSCOPY WITH ROTATOR CUFF REPAIR AND OPEN BICEPS TENODESIS Left 12/27/2021   Procedure: Left shoulder arthroscopic cuff repair (supraspinatus and subscapularis) with Regeneten Patch application;  Surgeon: Tobie Priest, MD;  Location: [facility];  Service: Orthopedics;  Laterality: Left;    SOCIAL HISTORY: Social History   Socioeconomic History   Marital status: Married    Spouse name: Madeline   Number of children: Not on file   Years of education: Not on file   Highest education level: Associate degree: occupational, scientist, product/process development, or vocational program  Occupational History   Not on file  Tobacco Use   Smoking status: Former    Current packs/day: 0.00    Average packs/day: 1 pack/day for  10.0 years (10.0 ttl pk-yrs)    Types: Cigarettes    Start date: 02/17/1969    Quit date: 02/18/1979    Years since quitting: 44.8   Smokeless tobacco: Former    Types: Snuff  Vaping Use   Vaping status: Never Used  Substance and Sexual Activity   Alcohol  use: Not Currently    Comment: last drink in 2021   Drug use: No   Sexual activity: Not on file  Other Topics Concern   Not on file  Social History Narrative   Lives in [place]; with wife, Madeline. Retd from [employer]; quit in 1980; used to drink alcohol ; no beer in last 2022. No indoor pets.   Social Drivers of Corporate Investment Banker Strain: High Risk (10/28/2023)   Received from [facility]   Overall Financial Resource Strain (CARDIA)    Difficulty of Paying Living Expenses: Hard  Food Insecurity: No Food Insecurity (10/28/2023)   Received from [facility]  Hunger Vital Sign    Within the past 12 months, you worried that your food would run out before you got the money to buy more.: Never true    Within the past 12 months, the food you bought just didn't last and you didn't have money to get more.: Never true  Transportation Needs: No Transportation Needs (10/28/2023)   Received from [facility] - Transportation    In the past 12 months, has lack of transportation kept you from medical appointments or from getting medications?: No    Lack of Transportation (Non-Medical): No  Physical Activity: Inactive (08/06/2023)   Exercise Vital Sign    Days of Exercise per Week: 0 days    Minutes of Exercise per Session: Not on file  Stress: No Stress Concern Present (08/06/2023)   [employer] of Occupational Health - Occupational Stress Questionnaire    Feeling of Stress: Only a little  Social Connections: Socially Integrated (09/13/2023)   Social Connection and Isolation Panel    Frequency of Communication with Friends and Family: Three times a week     Frequency of Social Gatherings with Friends and Family: Three times a week    Attends Religious Services: More than 4 times per year    Active Member of Clubs or Organizations: Yes    Attends Banker Meetings: More than 4 times per year    Marital Status: Married  Catering Manager Violence: Not At Risk (09/13/2023)   Humiliation, Afraid, Rape, and Kick questionnaire    Fear of Current or Ex-Partner: No    Emotionally Abused: No    Physically Abused: No    Sexually Abused: No    FAMILY HISTORY: Family History  Problem Relation Age of Onset   Heart disease Father    Hypertension Father    Stroke Father    Cancer Father        prostate cancer   Cancer Sister    Arrhythmia Sister        A-fib   Arrhythmia Brother        A-fib   Cancer Brother     ALLERGIES:  is allergic to bupivacaine  liposome.  MEDICATIONS:  Current Outpatient Medications  Medication Sig Dispense Refill   amLODipine  (NORVASC ) 5 MG tablet Take 1 tablet (5 mg total) by mouth daily. 90 tablet 3   benazepril  (LOTENSIN ) 40 MG tablet TAKE ONE TABLET BY MOUTH ONE TIME DAILY 90 tablet 3   busPIRone  (BUSPAR ) 10 MG tablet TAKE ONE TABLET BY MOUTH FOUR TIMES A DAY AS NEEDED FOR ANIXETY 360 tablet 1   Calcium -Magnesium-Vitamin D  (CITRACAL CALCIUM  +D3 PO)      carboxymethylcellulose (REFRESH PLUS) 0.5 % SOLN Place 1-2 drops into both eyes 3 (three) times daily as needed (dry/irritated eyes.).     cholecalciferol (VITAMIN D3) 25 MCG (1000 UNIT) tablet Take 1,000 Units by mouth daily.     cyanocobalamin  (VITAMIN B12) 1000 MCG/ML injection Inject 1 mL (1,000 mcg total) into the skin every 30 (thirty) days. 1 mL 12   cyclobenzaprine  (FLEXERIL ) 10 MG tablet Take 1 tablet (10 mg total) by mouth at bedtime. 90 tablet 3   DULoxetine  (CYMBALTA ) 60 MG capsule Take 1 capsule (60 mg total) by mouth daily. 90 capsule 1   fluticasone  (FLONASE ) 50 MCG/ACT nasal spray Place 2 sprays into both nostrils daily. prn      HYDROcodone -acetaminophen  (NORCO) 7.5-325 MG tablet Take 1 tablet by mouth every 6 (six) hours as needed.  120 tablet 0   [START ON 01/18/2024] HYDROcodone -acetaminophen  (NORCO) 7.5-325 MG tablet Take 1 tablet by mouth every 6 (six) hours as needed. 120 tablet 0   melatonin 3 MG TABS tablet Take 3 mg by mouth at bedtime.     Multiple Vitamin (MULTIVITAMIN WITH MINERALS) TABS tablet Take 1 tablet by mouth in the morning.     traZODone  (DESYREL ) 150 MG tablet TAKE ONE TABLET BY MOUTH AT BEDTIME 90 tablet 0   XARELTO  20 MG TABS tablet TAKE ONE TABLET BY MOUTH ONE TIME DAILY WITH SUPPER 90 tablet 2   No current facility-administered medications for this visit.     PHYSICAL EXAMINATION:   Vitals:   12/26/23 1450  BP: (!) 131/91  Pulse: 97  Resp: 16  Temp: 98.4 F (36.9 C)  SpO2: 96%    Filed Weights   12/26/23 1450  Weight: (!) 354 lb 6.4 oz (160.8 kg)     Physical Exam Vitals and nursing note reviewed.  HENT:     Head: Normocephalic and atraumatic.     Mouth/Throat:     Pharynx: Oropharynx is clear.  Eyes:     Extraocular Movements: Extraocular movements intact.     Pupils: Pupils are equal, round, and reactive to light.  Cardiovascular:     Rate and Rhythm: Normal rate and regular rhythm.  Pulmonary:     Comments: Decreased breath sounds bilaterally.  Abdominal:     Palpations: Abdomen is soft.  Musculoskeletal:        General: Normal range of motion.     Cervical back: Normal range of motion.  Skin:    General: Skin is warm.  Neurological:     General: No focal deficit present.     Mental Status: He is alert and oriented to person, place, and time.  Psychiatric:        Behavior: Behavior normal.        Judgment: Judgment normal.      LABORATORY DATA:  I have reviewed the data as listed Lab Results  Component Value Date   WBC 7.5 12/26/2023   HGB 14.9 12/26/2023   HCT 44.9 12/26/2023   MCV 87.0 12/26/2023   PLT 195 12/26/2023   Recent Labs     02/12/23 1456 02/28/23 1353 07/31/23 0925 09/13/23 1840 09/14/23 0604 09/19/23 1413 11/28/23 1524 12/26/23 1440  NA 139   < > 141 140 139 141 138 141  K 4.5   < > 4.6 4.6 4.0 4.9 5.4* 4.5  CL 106   < > 107 104 105 106 105 106  CO2 24   < > 27 24 25 26 23 23   GLUCOSE 85   < > 97 115* 113* 100* 88 104*  BUN 13   < > 16 16 12 16 16 21   CREATININE 0.95   < > 0.90 1.04 0.91 1.12 1.03 1.05  CALCIUM  8.8*   < > 9.0 8.9 8.5* 9.3 9.3 9.4  GFRNONAA >60   < >  --  >60 >60  --   --  >60  PROT 6.6  --  6.4 6.5  --  6.2  --   --   ALBUMIN 4.2  --   --  4.0  --   --   --   --   AST 18  --  14 17  --  19  --   --   ALT 15  --  11 12  --  12  --   --  ALKPHOS 71  --   --  73  --   --   --   --   BILITOT 0.6  --  0.5 0.9  --  0.3  --   --    < > = values in this interval not displayed.     LONG TERM MONITOR (3-14 DAYS) Result Date: 12/13/2023 Patch Wear Time:  6 days and 21 hours (2025-11-14T21:53:50-0500 to 2025-11-21T19:38:09-498) HR 51 - 164, average 86 bpm. 1 nonsustained VT (longest 4 beats). Atrial flutter detected. 100% burden, occurring continuously throughout the monitoring period. No supraventricular ectopy due to AFL. Rare ventricular ectopy. Symptom trigger episodes correspond to predominantly atrial fibrillation/flutter with rapid rates. Fonda Kitty Cardiac Electrophysiology  MR KNEE RIGHT WO CONTRAST Result Date: 11/27/2023 MR KNEE WITHOUT IV CONTRAST RIGHT COMPARISON: X-ray 11/09/2022 CLINICAL HISTORY: Chronic knee pain. Degenerative disease. PULSE SEQUENCES: Ax PD FS, Sag T2 ACL, Sag PD FS, Cor PD FS & COR T1 FINDINGS: Bones: There is severe tricompartmental osteoarthrosis with full-thickness cartilage loss and remodeling of the medial lateral compartments. There are marginal osteophytes. Postsurgical changes are present near the tibial attachment of the fibular collateral ligament. There is a moderate reactive joint effusion and mild to moderate synovial hypertrophy. The extensor  mechanism is intact. Ligaments: There is no visualized ACL consistent with chronic ACL disruption. The PCL is markedly attenuated with poor visualization of the femoral attachment. Correlation for PCL and ACL instability. There is medial displacement of the MCL. The fibular collateral ligament is increased in signal but grossly intact. Menisci: There is no visualized lateral meniscus consistent with chronic lateral meniscectomy. There is no visualized posterior horn and posterior body of the medial meniscus. This is likely related to medial meniscectomy. No displaced meniscal tear is identified. IMPRESSION: There is severe tricompartment osteoarthrosis with full-thickness cartilage loss and joint space loss. There is a moderate reactive joint effusion and synovial hypertrophy. Absent lateral meniscus and absent posterior horn and posterior body of the medial meniscus. Findings are likely post meniscectomy related. Clinical correlation. No displaced meniscal flap is present. Nonvisualization of the anterior cruciate ligament consistent with chronic disruption. There is poor visualization of the tibial insertion of the PCL suggesting complete disruption. Correlation for ACL and PCL instability. Electronically signed by: Norleen Satchel MD 11/27/2023 03:39 PM EST RP Workstation: [id number]   MR SHOULDER LEFT WO CONTRAST Result Date: 11/27/2023 EXAM DESCRIPTION: MR SHOULDER LEFT WO CONTRAST CLINICAL HISTORY: 68 years, Male, Shoulder pain, chronic, bursitis suspected, xray done; Shoulder pain, chronic, erosive osteoarthritis suspected, xray done COMPARISON: None TECHNIQUE: MRI of the shoulder was performed with multiplanar multi sequence imaging according to our usual protocol. FINDINGS: Shoulder arthroplasty in place with metal artifact. Alignment appears unremarkable. No fracture or dislocation. The visualized marrow signal is unremarkable. There is at least a small joint effusion which communicates with a large  amount of subacromial/subdeltoid bursal fluid. There is a retracted full-thickness tear of the supraspinatus tendon and subscapularis. Moderate to severe muscle belly atrophy. I believe the infraspinatus fibers are intact but obscured by metal artifact. Moderate acromioclavicular osteoarthritis. IMPRESSION: There is at least a small amount of joint fluid and a large amount of communicating subacromial/subdeltoid bursal fluid. There is a chronic appearing complete tear of the supraspinatus tendon and subscapularis tendons. Moderate to severe atrophy of the musculature. Moderate acromioclavicular osteoarthritis. If infected prosthesis is suspected clinically, aspiration and bone scan would be recommended. Electronically signed by: Reyes Frees MD 11/27/2023 03:02 PM EST RP Workstation: [id number]   CT  RENAL STONE STUDY Result Date: 11/27/2023 CLINICAL DATA:  Abdominal/flank pain.  Possible renal stone. EXAM: CT ABDOMEN AND PELVIS WITHOUT CONTRAST TECHNIQUE: Multidetector CT imaging of the abdomen and pelvis was performed following the standard protocol without IV contrast. RADIATION DOSE REDUCTION: This exam was performed according to the departmental dose-optimization program which includes automated exposure control, adjustment of the mA and/or kV according to patient size and/or use of iterative reconstruction technique. COMPARISON:  09/13/2023, 02/20/2022 FINDINGS: Lower chest: Heart is normal size.  Visualized lung bases are clear. Hepatobiliary: Moderate cholelithiasis. Mild nodular contour to the liver which may be due to a degree of cirrhosis. No focal liver mass. Biliary tree is unremarkable. Pancreas: Normal. Spleen: Normal. Adrenals/Urinary Tract: Adrenal glands are normal. Kidneys are normal in size. 2 mm stone over the lower pole left kidney. No significant hydronephrosis. Exophytic 1.1 cm hyperdense mass over the upper pole left kidney unchanged and likely due to a hyperdense cyst, although  indeterminate. Ureters and bladder are normal. Stomach/Bowel: Surgical changes of the stomach compatible previous bypass surgery. Small bowel is normal. Moderate diverticulosis of the colon with resolution of the previously seen acute diverticulitis over the rectosigmoid junction. Appendix is normal. Vascular/Lymphatic: Mild calcified plaque over the abdominal aorta which is normal caliber. Remaining vascular structures are unremarkable. No adenopathy. Reproductive: Radiation seed implants over the prostate. Other: Small left inguinal hernia containing only mesenteric fat. No free peritoneal fluid or focal inflammatory change. Musculoskeletal: No focal abnormality. Degenerative changes with multilevel disc disease over the lumbar spine. IMPRESSION: 1. No acute findings in the abdomen/pelvis. 2. Moderate diverticulosis of the colon with resolution of the previously seen acute diverticulitis over the rectosigmoid junction. 3. Moderate cholelithiasis. 4. 2 mm nonobstructing left renal stone. 5. 1.1 cm exophytic hyperdense mass over the upper pole left kidney unchanged and likely due to a hyperdense cyst, although indeterminate. Recommend follow-up renal ultrasound for further evaluation. 6. Small left inguinal hernia containing only mesenteric fat. 7. Aortic atherosclerosis. Aortic Atherosclerosis (ICD10-I70.0). Electronically Signed   By: Toribio Agreste M.D.   On: 11/27/2023 14:05    ASSESSMENT & PLAN:   Symptomatic anemia # Anemia- Hb-symptomatic.  Likely due to iron  deficiency -likely secondary to malabsorption/Hx of gastric bypass.  Status post IV iron  infusion. Did not tolerate  barimelt+ iron . Proceed with venofer  today.  #Etiology of iron  deficiency: Malabsorption/gastric bypass. Incidental CT February 2024-  possible mucosal lesion at the distal small bowel anastomosis status post gastric bypass. S/p  EGD/colo/ SBE [Dr.Vanga]-  stable.   # B12 def [PCP]- home b12 injections-   # CAD/A.fib-s/p  ablation [Dec 2024- [facility]] on Xarelto /amiodarone . Stable;     # Hypocalcemia- on ca+ Vit D- levels today.   # Obesity- not on GLP-1 pills.   # DISPOSITION: # venofer  today # follow up in 6 months- MD: labs- cbc;bmp; iron  studies; ferritin; vit D 25-OH possible venofer -Dr.B   All questions were answered. The patient knows to call the clinic with any problems, questions or concerns.  Cindy [name] Joe, MD 12/26/2023 3:15 PM

## 2023-12-26 NOTE — Progress Notes (Signed)
 Fatigue/weakness: YES Dyspena: AT TIMES SOB Light headedness: AT TIMES Blood in stool: YES  Dx with bladder cancer yesterday, sees Dr. Twylla. Sch'd  01/14/24 to have it removed. Having hand surgery 01/04/24.

## 2023-12-26 NOTE — Assessment & Plan Note (Addendum)
#   Anemia- Hb-symptomatic.  Likely due to iron  deficiency -likely secondary to malabsorption/Hx of gastric bypass.  Status post IV iron  infusion. Did not tolerate  barimelt+ iron . Proceed with venofer  today.  #Etiology of iron  deficiency: Malabsorption/gastric bypass. Incidental CT February 2024-  possible mucosal lesion at the distal small bowel anastomosis status post gastric bypass. S/p  EGD/colo/ SBE [Dr.Vanga]-  stable.   # B12 def [PCP]- home b12 injections-   # CAD/A.fib-s/p ablation [Dec 2024- [facility]] on Xarelto /amiodarone . Stable;     # Hypocalcemia- on ca+ Vit D- levels today.   # Obesity- not on GLP-1 pills.   # DISPOSITION: # venofer  today # follow up in 6 months- MD: labs- cbc;bmp; iron  studies; ferritin; vit D 25-OH possible venofer -Dr.B

## 2023-12-26 NOTE — Addendum Note (Signed)
 Addended by: LAEL BROWNING A on: 12/26/2023 03:31 PM   Modules accepted: Orders

## 2023-12-27 ENCOUNTER — Encounter

## 2023-12-27 ENCOUNTER — Ambulatory Visit: Attending: Cardiology | Admitting: Cardiology

## 2023-12-27 ENCOUNTER — Encounter: Payer: Self-pay | Admitting: Cardiology

## 2023-12-27 ENCOUNTER — Ambulatory Visit (INDEPENDENT_AMBULATORY_CARE_PROVIDER_SITE_OTHER): Admitting: Urology

## 2023-12-27 DIAGNOSIS — I4819 Other persistent atrial fibrillation: Secondary | ICD-10-CM

## 2023-12-27 DIAGNOSIS — D6869 Other thrombophilia: Secondary | ICD-10-CM

## 2023-12-27 DIAGNOSIS — Z01810 Encounter for preprocedural cardiovascular examination: Secondary | ICD-10-CM

## 2023-12-27 DIAGNOSIS — I4892 Unspecified atrial flutter: Secondary | ICD-10-CM

## 2023-12-27 DIAGNOSIS — D494 Neoplasm of unspecified behavior of bladder: Secondary | ICD-10-CM

## 2023-12-27 NOTE — Progress Notes (Signed)
 Electrophysiology Clinic Note    Date:  12/27/2023  Patient ID:  [name], [name] 06-23-1955, MRN [id number] PCP:  Edman Marsa JINNY, DO  Cardiologist:  Evalene Lunger, MD  Electrophysiologist:  Fonda Kitty, MD  Electrophysiology APP:  Hoorain Kozakiewicz, NP     Discussed the use of AI scribe software for clinical note transcription with the patient, who gave verbal consent to proceed.   Patient Profile    Chief Complaint: AFib, pre-procedure evaluation  History of Present Illness: [name] is a 68 y.o. male with PMH notable for persis Afib, HFmrEF, CAD, HTN ; seen today for Fonda Kitty, MD (formerly Dr. Fernande) for routine electrophysiology followup.   He is s/p AF ablation with isolation of pulm veins and posterior wall on 11/2022 by Dr. Lorean at [facility].   I saw him 05/2023 where he was not aware of any recent AF episodes, using watch and Kardia[employer] to monitor. He requested to stop amiodarone  to limit off-target effects, so it was stopped.  I saw him 11/2023 where he was in atrial flutter, asymptomatic and unsure when he converted. He had recently had hematuria and work-up was in-process. Updated zio monitor showed 100% Aflutter burden. He has since been diagnosed with a bladder tumor and has removal surgery planned for later this month.   Currently, he feels well. Continues to be asymptomatic of his atrial arrhythmias. He continues to take xarelto  daily with no new bleeding concerns.   He also has trigger finger release surgery planned for next week.    Arrhythmia/Device History Amiodarone  - stopped 05/2023     ROS:  Please see the history of present illness. All other systems are reviewed and otherwise negative.    Physical Exam    VS:  BP 120/74 (BP Location: Left Arm, Patient Position: Sitting, Cuff Size: Normal)   Pulse 92   Ht 6' (1.829 m)   Wt (!) 356 lb (161.5 kg)   SpO2 96%   BMI 48.28 kg/m  BMI: Body mass index is 48.28 kg/m.             Wt Readings from Last 3 Encounters:  12/27/23 (!) 356 lb (161.5 kg)  12/26/23 (!) 354 lb 6.4 oz (160.8 kg)  12/24/23 (!) 350 lb (158.8 kg)     GEN- The patient is well appearing, alert and oriented x 3 today.   Lungs- Clear to ausculation bilaterally, normal work of breathing.  Heart- Irregularly irregular rate and rhythm, no murmurs, rubs or gallops Extremities- 1-2+ peripheral edema, warm, dry   Studies Reviewed   Previous EP, cardiology notes.    EKG is ordered. Personal review of EKG from today shows:    EKG Interpretation Date/Time:  Thursday December 27 2023 14:43:44 EST Ventricular Rate:  92 PR Interval:    QRS Duration:  82 QT Interval:  352 QTC Calculation: 435 R Axis:   50  Text Interpretation: Atrial fibrillation Confirmed by Saxon Crosby [phone]) on 12/27/2023 2:47:57 PM      Long term monitor, 12/12/2023 HR 51 - 164, average 86 bpm. 1 nonsustained VT (longest 4 beats). Atrial flutter detected. 100% burden, occurring continuously throughout the monitoring period. No supraventricular ectopy due to AFL. Rare ventricular ectopy. Symptom trigger episodes correspond to predominantly atrial fibrillation/flutter with rapid rates.  Cardiac CT scoring, 09/11/2023 1. Coronary calcium  score of 812. This was 81st percentile for age and sex matched control.  2. Mildly dilated aortic root.  TTE, 06/03/2022  1. Left  ventricular ejection fraction, by estimation, is 55 to 60%. Left ventricular ejection fraction by 2D MOD biplane is 58.3 %. The left ventricle has normal function. The left ventricle has no regional wall motion abnormalities. Left ventricular diastolic parameters are indeterminate.   2. Right ventricular systolic function is normal. The right ventricular size is normal. There is mildly elevated pulmonary artery systolic pressure. The estimated right ventricular systolic pressure is 37.9 mmHg.   3. Left atrial size was severely dilated.   4. The mitral valve  is normal in structure. Mild to moderate mitral valve regurgitation. No evidence of mitral stenosis.   5. The aortic valve has an indeterminant number of cusps. Aortic valve regurgitation is mild. Aortic valve sclerosis is present, with no evidence of aortic valve stenosis.   6. There is borderline dilatation of the ascending aorta, measuring 38 mm.   7. The inferior vena cava is normal in size with greater than 50% respiratory variability, suggesting right atrial pressure of 3 mmHg.    TTE, 07/04/2019  1. Left ventricular ejection fraction, by estimation, is 55%. The left ventricle has low normal function. Mild hypokinesis of the mid to apical anteroseptal wall. Left ventricular diastolic parameters are consistent with Grade II diastolic dysfunction  (pseudonormalization).   2. Right ventricular systolic function is normal. The right ventricular size is normal. There is moderately elevated pulmonary artery systolic pressure.   3. Left atrial size was moderately dilated.   Comparison(s): Previous Echo showed LV EF 45-50%, mild anterior and anteroseptal HK, mild MR, moderate LAE.    Cardiac scoring CT, 11/06/2014 Coronary calcium  score of 224 (all in proximal LAD). This was 55 percentile for age and sex matched control.   Assessment and Plan     #) persis AFib #) aflutter S/p AF ablation 11/2022 at [facility], now with recurrence of afib/flutter He is relatively asymptomatic Ventricular rates well-controlled  Long term, I think he will benefit from rhythm control, and discussed tikosyn loading process. He is agreeable to this Will initiate tikosyn in 2026 after he has healed from his bladder procedure Will msg AF clinic to begin process   #) Hypercoag d/t persis afib #) hematuria CHA2DS2-VASc Score = at least 4 [CHF History: 1, HTN History: 1, Diabetes History: 0, Stroke History: 0, Vascular Disease History: 1, Age Score: 1, Gender Score: 0].  Therefore, the patient's annual risk of stroke is  4.8 %.    Stroke ppx - 20mg  xarelto  daily, appropriately dosed Ok to hold xarelto  for upcoming procedures   #) pre-procedure evaluation Patient remains in afib. We discussed that there is some risk of stroke with holding OAC Both procedures are low-risk He does not need any further work-up prior to procedures      Current medicines are reviewed at length with the patient today.   The patient has concerns regarding his medicines.  The following changes were made today:  none  Labs/ tests ordered today include:  Orders Placed This Encounter  Procedures   EKG 12-Lead     Disposition: Follow up with Dr. Kennyth or EP APP in 1-2 months    Signed, Chantal Needle, NP  12/27/2023  4:27 PM  Electrophysiology [facility]

## 2023-12-27 NOTE — Progress Notes (Signed)
 Pt dropped off urine for PCR DNA testing to be sent off for pre-op urine test. Pt will be notified when results come back. PCR sent via [employer].  Mathew Pinal, RN

## 2023-12-27 NOTE — Patient Instructions (Addendum)
 Medication Instructions:  Your physician recommends that you continue on your current medications as directed. Please refer to the Current Medication list given to you today.  *If you need a refill on your cardiac medications before your next appointment, please call your pharmacy*  Lab Work: No labs ordered today    Testing/Procedures: No test ordered today   Follow up:  Your next appointment:   7 week(s)  Provider:   Suzann Riddle, NP

## 2023-12-28 ENCOUNTER — Telehealth: Payer: Self-pay

## 2023-12-28 NOTE — Telephone Encounter (Signed)
° °  Patient Name: [name]  DOB: [birth year] MRN: [id number]  Primary Cardiologist: Evalene Lunger, MD  Chart reviewed as part of pre-operative protocol coverage. Given past medical history and time since last visit, based on ACC/AHA guidelines, [name] is at acceptable risk for the planned procedure without further cardiovascular testing.    Per Othel Needle 12/27/2023 Patient remains in afib. We discussed that there is some risk of stroke with holding OAC Both procedures are low-risk He does not need any further work-up prior to procedures  Per Pharmacy: 12/28/2023 Patient has not had an Afib/aflutter ablation in the last 3 months, DCCV within the last 4 weeks or a watchman implanted in the last 45 days    Per office protocol, patient can hold Xarelto  for 2 days prior to procedure.     The patient was advised that if he develops new symptoms prior to surgery to contact our office to arrange for a follow-up visit, and he verbalized understanding.  I will route this recommendation to the requesting party via Epic fax function and remove from pre-op pool.  Please call with questions.  Lamarr Satterfield, NP 12/28/2023, 9:16 AM

## 2023-12-28 NOTE — Telephone Encounter (Signed)
 Received surgical clearance back from [facility]. Patient is to hold Xarelto  for 2 days prior to surgery. Patient has been advised and verbalized understanding. Last Dosage on 01/11/2024.

## 2023-12-28 NOTE — Telephone Encounter (Signed)
 Patient with diagnosis of afib on Xarelto  for anticoagulation.    Procedure: TRANSURETHRAL RESECTION OF BLADDER TUMOR WITH INTRAVESICAL INSTILLATION OF GEMCITABINE  Date of procedure: 01/14/24   CHA2DS2-VASc Score = 4   This indicates a 4.8% annual risk of stroke. The patient's score is based upon: CHF History: 1 HTN History: 1 Diabetes History: 0 Stroke History: 0 Vascular Disease History: 1 Age Score: 1 Gender Score: 0      CrCl 102 ml/min Platelet count 195  Patient has not had an Afib/aflutter ablation in the last 3 months, DCCV within the last 4 weeks or a watchman implanted in the last 45 days   Per office protocol, patient can hold Xarelto  for 2 days prior to procedure.    **This guidance is not considered finalized until pre-operative APP has relayed final recommendations.**

## 2023-12-31 ENCOUNTER — Encounter

## 2023-12-31 ENCOUNTER — Telehealth: Payer: Self-pay | Admitting: Pharmacist

## 2023-12-31 NOTE — Telephone Encounter (Signed)
 Medication list reviewed in anticipation of upcoming Tikosyn initiation. Patient is not taking any contraindicated medications but is taking 2 QTc prolonging medications.   Furosemide : may increase the QTc prolonging effect due to its effect on K and Mag. Please ensure K is at least 4 and Mag at least 2 prior to starting dofetilide and monitor closely during therapy  Trazodone :may increase QTc-prolonging effects of dofetilide. Monitor QTc closely.   Patient is anticoagulated on Xarelto  on the appropriate dose. Please ensure that patient has not missed any anticoagulation doses in the 3 weeks prior to Tikosyn initiation.   Patient will need to be counseled to avoid use of Benadryl while on Tikosyn and in the 2-3 days prior to Tikosyn initiation.

## 2024-01-01 ENCOUNTER — Other Ambulatory Visit: Payer: Self-pay

## 2024-01-01 ENCOUNTER — Encounter: Payer: Self-pay | Admitting: Urology

## 2024-01-01 ENCOUNTER — Inpatient Hospital Stay
Admission: RE | Admit: 2024-01-01 | Discharge: 2024-01-01 | Disposition: A | Source: Ambulatory Visit | Attending: Urology | Admitting: Urology

## 2024-01-01 NOTE — Patient Instructions (Addendum)
 Your procedure is scheduled on:  01/14/2024  Monday Report to the Registration Desk on the 1st floor of the [facility]. To find out your arrival time, please call [phone] between 1PM - 3PM on: 01/11/2024 Friday If your arrival time is 6:00 am, do not arrive before that time as the [facility] Mall entrance doors do not open until 6:00 am.  REMEMBER: Instructions that are not followed completely may result in serious medical risk, up to and including death; or upon the discretion of your surgeon and anesthesiologist your surgery may need to be rescheduled.  Do not eat food or drink anything after midnight the night before surgery.  No gum chewing or hard candies.    One week prior to surgery: Stop Anti-inflammatories (NSAIDS) groups such as Advil, Aleve, Ibuprofen, Motrin, Naproxen, Naprosyn and Aspirin  based products such as Excedrin, Goody's Powder, BC Powder. Stop ANY OVER THE COUNTER supplements until after surgery.Cholecalciferol (VITAMIN D ),Calcium -Magnesium-Vitamin D , Multiple Vitamin  You may however, continue to take Tylenol  if needed for pain up until the day of surgery.   Follow the instructions given by your provider  regarding XARELTO  for this surgery.  Continue taking all of your other prescription medications up until the day of surgery.  ON THE DAY OF SURGERY ONLY TAKE THESE MEDICATIONS WITH SIPS OF WATER:  amLODipine  (NORVASC ) busPIRone  (BUSPAR ) DULoxetine  (CYMBALTA ) HYDROcodone -acetaminophen     No Alcohol  for 24 hours before or after surgery.  No Smoking including e-cigarettes for 24 hours before surgery.  No chewable tobacco products for at least 6 hours before surgery.  No nicotine patches on the day of surgery.  Do not use any recreational drugs for at least a week (preferably 2 weeks) before your surgery.  Please be advised that the combination of cocaine and anesthesia may have negative outcomes, up to and including death. If you test positive for  cocaine, your surgery will be cancelled.  On the morning of surgery brush your teeth with toothpaste and water, you may rinse your mouth with mouthwash if you wish. Do not swallow any toothpaste or mouthwash.  You may shower on day of surgery.  Do not wear jewelry, make-up, hairpins, clips or nail polish.   Do not wear lotions, powders, or perfumes including deodorant   Do not shave body hair from the neck down 48 hours before surgery.  Contact lenses, hearing aids and dentures may not be worn into surgery.  Do not bring valuables to the hospital. [facility] is not responsible for any missing/lost belongings or valuables.    Notify your doctor if there is any change in your medical condition (cold, fever, infection).  Wear comfortable clothing (specific to your surgery type) to the hospital.  After surgery, you can help prevent lung complications by doing breathing exercises.   If you are being admitted to the hospital overnight, leave your suitcase in the car. After surgery it may be brought to your room.   If you are being discharged the day of surgery, you will not be allowed to drive home. You will need a responsible individual to drive you home and stay with you for 24 hours after surgery.    Please call the Pre-admissions Testing Dept. at [phone] if you have any questions about these instructions.  Surgery Visitation Policy:  Patients having surgery or a procedure may have two visitors.  Children under the age of 63 must have an adult with them who is not the patient.    [employer]  Directory to address health-related social needs:  [email or web address] procedure is scheduled on: Report to the Registration Desk on the 1st floor of the [facility]. To find out your arrival time, please call [phone] between 1PM - 3PM on: If your arrival time is 6:00 am, do not arrive before that time as the Medical [facility] entrance doors do not open  until 6:00 am.

## 2024-01-02 ENCOUNTER — Encounter

## 2024-01-02 ENCOUNTER — Ambulatory Visit

## 2024-01-02 ENCOUNTER — Ambulatory Visit: Admitting: Cardiology

## 2024-01-04 ENCOUNTER — Encounter (HOSPITAL_COMMUNITY): Payer: Self-pay | Admitting: *Deleted

## 2024-01-04 ENCOUNTER — Encounter: Payer: Self-pay | Admitting: Urology

## 2024-01-04 NOTE — Progress Notes (Signed)
 " Perioperative / Anesthesia Services  Pre-Admission Testing Clinical Review / Pre-Operative Anesthesia Consult  Date: 01/04/2024  PATIENT DEMOGRAPHICS: Name: [name] DOB: 21-Aug-1955 MRN:   [id number]  Note: Available PAT nursing documentation and vital signs have been reviewed. Clinical nursing staff has updated patient's PMH/PSHx, current medication list, and drug allergies/intolerances to ensure complete and comprehensive history available to assist care teams in MDM as it pertains to the aforementioned surgical procedure and anticipated anesthetic course. Extensive review of available clinical information personally performed. Nursing documentation reviewed. [place] [facility] and PSHx updated with any diagnoses and/or procedures that I have knowledge of that may have been inadvertently omitted during his intake with the pre-admission testing department's nursing staff.  PLANNED SURGICAL PROCEDURE(S):   Case: [id number] Date/Time: 01/14/24 1241   Procedures:      TURBT (TRANSURETHRAL RESECTION OF BLADDER TUMOR)     INSTILLATION, BLADDER - Gemcitabine   Anesthesia type: General   Diagnosis: Bladder tumor [D49.4]   Pre-op diagnosis: Bladder Tumor   Location: [facility] OR ROOM 10 / [facility]   Surgeons: Twylla Glendia BROCKS, MD        CLINICAL DISCUSSION: [name] is a 68 y.o. male who is submitted for pre-surgical anesthesia review and clearance prior to him undergoing the above procedure. Patient is a Former Smoker (10 pack years; quit 02/1979). Pertinent PMH includes: CAD, cardiomyopathy, HFpEF, PAF, aortic atherosclerosis, SVT, aortic root dilitation,  HTN, HLD, hiatal hernia, pernicious anemia, prostate cancer, nephrolithiasis, OA,  DJD, lumbosacral DDD, chronic pain syndrome, chronic opioid use, sleep difficulties.   Patient is followed by cardiology (Gollan, MD). He was last seen in the cardiology clinic on ***; notes reviewed. ***At the time of his clinic visit,  patient doing well overall from a cardiovascular perspective. Patient denied any chest pain, shortness of breath, PND, orthopnea, palpitations, significant peripheral edema, weakness, fatigue, vertiginous symptoms, or presyncope/syncope. Patient with a past medical history significant for cardiovascular diagnoses. Documented physical exam was grossly benign, providing no evidence of acute exacerbation and/or decompensation of the patient's known cardiovascular conditions.  Patient with an atrial fibrillation diagnosis. He has undergone multiple procedures in the past surrounding this diagnosis.    DCCV procedure performed on 03/13/2014.  Patient received a single 200 J synchronized cardioversion restoring him to NSR.   Atrial arrhythmia refractory.  Repeat DCCV procedure was performed on 05/15/2019.  Patient received 150 J x 1, followed by 200 J x 2 synchronized cardioversion before being restored to NSR.   Atrial arrhythmia refractory.  Repeat DCCV procedure was performed on 05/19/2022.  Pads were placed and a anterior-posterior orientation and manual pressure was applied to pads.  Patient received a single 150 J, followed by 200 J x 2 synchronized cardioversions, which did not convert patient to a sinus rhythm.  Orientation of pads was changed to an anterior-lateral position.  Manual pressure again placed on pads prior to energy delivery. Patient received additional 200 J x 2 synchronized cardioversion attempts, however patient remained and atrial fibrillation.   Atrial arrhythmia refractory.  Repeat DCCV procedure was performed on 07/25/2022, at which time patient received a single 200 J synchronized cardioversion restoring him to a normal sinus rhythm.   Patient again with refractory atrial arrhythmia.  He underwent repeat DCCV procedure on 09/21/2022, at which time he received a single 200 J second cardioversion, again converting him to a normal sinus rhythm.   Atrial arrhythmia refractory.  He  has undergone multiple DCCV procedures in  the past.  The decision was made to undergo cardiac ablation.  Patient was seen at [facility] by Dr. Shirlyn Adjutant, MD and ultimately underwent at pulmonary vein isolation ablation on 11/17/2022.  *** ***  Blood pressure***controlled at *** mmHg on currently prescribed *** therapies.  Patient is on *** for his HLD diagnosis and ASCVD prevention. ***Patient is not diabetic. ***He does not have an OSAH diagnosis. ***FC. No changes were made to his medication regimen during his visit with cardiology.  Patient scheduled to follow-up with outpatient cardiology in***months or sooner if needed.  [name] is scheduled for an elective TURBT (TRANSURETHRAL RESECTION OF BLADDER TUMOR); INSTILLATION, BLADDER on 01/14/2024 with Dr. Glendia JAYSON Barba, MD. Given patient's past medical history significant for cardiovascular diagnoses, presurgical cardiac clearance was sought by the PAT team. Per cardiology, patient remains in afib. We discussed that there is some risk of stroke with holding OAC. Both procedures are low-risk.He does not need any further work-up prior to procedures.  Again, this patient is on daily oral anticoagulation therapy using a DOAC medication. He has been instructed on recommendations for holding his apixaban  for 2 days prior to his procedure with plans to restart as soon as postoperative bleeding risk felt to be minimized by his primary attending surgeon. The patient has been instructed that his last dose should be on 01/11/2024.SABRA  Patient denies previous perioperative complications with anesthesia in the past. In review his EMR, it is noted that patient underwent a general anesthetic course here at [facility] ([facility]) in 10/2023 without documented complications.   MOST RECENT VITAL SIGNS:    12/27/2023    2:08 PM 12/26/2023    3:35 PM 12/26/2023    2:50 PM  Vitals with BMI  Height 6' 0  6' 0  Weight 356 lbs  354 lbs  6 oz  BMI 48.27  48.05  Systolic 120 131 868  Diastolic 74 92 91  Pulse 92 90 97   PROVIDERS/SPECIALISTS: NOTE: Primary physician provider listed below. Patient may have been seen by APP or partner within same practice.   PROVIDER ROLE / SPECIALTY LAST SHERLEAN Barba Glendia JAYSON, MD Urology (Surgeon) 12/24/2023  Edman Marsa PARAS, DO Primary Care Provider 09/19/2023  Perla Lye, MD Cardiology [phone]  Fernande Standing, MD  Electrophysiology  12/27/2023  Adjutant Shirlyn, MD  Electrophysiology  01/16/2023  Marcelino Nurse, MD  Pain Management  11/15/2023  Rennie Lighter, MD Medical Oncology/Hematology  12/26/2023   ALLERGIES: Allergies[1]  CURRENT HOME MEDICATIONS:  amLODipine  (NORVASC ) 5 MG tablet   benazepril  (LOTENSIN ) 40 MG tablet   busPIRone  (BUSPAR ) 10 MG tablet   Calcium -Magnesium-Vitamin D  (CITRACAL CALCIUM  +D3 PO)   carboxymethylcellulose (REFRESH PLUS) 0.5 % SOLN   Cholecalciferol (VITAMIN D ) 125 MCG CAPS   cyanocobalamin  (VITAMIN B12) 1000 MCG/ML injection   cyclobenzaprine  (FLEXERIL ) 10 MG tablet   DULoxetine  (CYMBALTA ) 60 MG capsule   FERROUS BISGLYCINATE CHELATE PO   fluticasone  (FLONASE ) 50 MCG/ACT nasal spray   furosemide  (LASIX ) 20 MG tablet   HYDROcodone -acetaminophen  (NORCO) 7.5-325 MG tablet   melatonin 3 MG TABS tablet   Multiple Vitamin (MULTIVITAMIN WITH MINERALS) TABS tablet   Pancrelipase, Lip-Prot-Amyl, 24000-76000 units CPEP   traZODone  (DESYREL ) 150 MG tablet   XARELTO  20 MG TABS tablet   [START ON 01/18/2024] HYDROcodone -acetaminophen  (NORCO) 7.5-325 MG tablet   HISTORY: Past Medical History:  Diagnosis Date   (HFpEF) heart failure with preserved ejection fraction (HCC)    a.) TTE 12/26/2013:  EF 45-50%, mild ant and antsept HK. mild MR. Mod dil LA. nl RV fxn. Rhythm was Afib; b.) TTE 07/04/2019: EF 55%, mid-apical anteroseptal HK, mild MAC, mild Ao sclerosis, G2DD, RVSP 45.3; c.) TTE 06/02/2022: EF 55-60%, no RWMAs, sev LAE, RVSP 37.9, mild-mod  MR, mild AR, AoV sclerosis without stenosis, asc Ao 38 mm   Anxiety    Aortic atherosclerosis    Bladder tumor    Cardiomyopathy (in setting of Afib)    a.) TTE 12/26/2013: EF 45-50%; b.) TTE 07/04/2019: EF 55%; c.) TTE 06/02/2022: EF 55-60%   Chronic pain syndrome    a.) followed by pain management   Chronic, continuous use of opioids    a.) on COT; followed by pain management   Coronary artery disease 11/06/2014   a.) cCTA 11/06/2014: Ca2+ = 224 (74th %ile; LAD distribution)   Diverticulosis    DJD (degenerative joint disease) of knee    Former smoker    History of hiatal hernia    History of kidney stones 2012   Hyperlipidemia    Hyperplastic colon polyp    Hypertension    Inguinal hernia, left    Internal hemorrhoids    Intervertebral disc disorder with radiculopathy of lumbosacral region    Lesion of bone of lumbosacral spine    L5   Lower extremity edema    Mixed hyperlipidemia    Morbid obesity (HCC)    Multiple falls    Myalgia due to statin    On rivaroxaban  therapy    Osteoarthritis    PAF (paroxysmal atrial fibrillation) (HCC)    a.) CHA2DS2VASc = 4 (age, HTN, HFpEF, vasc disease) as of 02/19/23;  b.) s/p DCCV 03/13/14 (200 J), 05/15/19 (150 J x 1, 200 J x2), 05/19/22 (150 J x1, 200 J x2; pads changed to ant/lat postion with additional 200 J x 2 -> did not convert), 07/25/22 (200 J), 09/21/22 (200 J); c.) s/p PVI ablation 11/17/22; d.) req to d/c amiodarone  12/2023 - plans for tikosyn early 2026; chronically anticoag on rivaroxaban    Pernicious anemia    Prostate cancer (HCC)    Pulmonary nodule, right    a. 10/2014 Cardiac CTA: 7mm RLL nodule; b. 04/2015 CT Chest: stable 7mm RLL nodule. No new nodules; 02/2017 CTA Chest: stable, benign, 7mm RLL pulm nodule.   Rotator cuff tear, left    Sleep difficulties    a.) takes melatonin + trazodone  PRN   SVT (supraventricular tachycardia)    Tubular adenoma of colon    Past Surgical History:  Procedure Laterality Date   ATRIAL  FIBRILLATION ABLATION N/A 11/17/2022   Procedure: ATRIAL FIBRILLATION ABLATION; Location: [facility]; Surgeon: Gehi, Anil, MD   BICEPT TENODESIS Right 09/27/2021   Procedure: Right reverse shoulder arthroplasty, biceps tenodesis;  Surgeon: Tobie Priest, MD;  Location: [facility] ORS;  Service: Orthopedics;  Laterality: Right;   CARDIOVERSION N/A 05/15/2019   Procedure: CARDIOVERSION;  Surgeon: Perla Evalene PARAS, MD;  Location: [facility];  Service: Cardiovascular;  Laterality: N/A;   CARDIOVERSION N/A 03/13/2014   Procedure: CARDIOVERSION; Location: [facility]; Surgeon: Evalene Perla, MD   CARDIOVERSION N/A 03/24/2022   Procedure: CARDIOVERSION;  Surgeon: Perla Evalene PARAS, MD;  Location: [facility];  Service: Cardiovascular;  Laterality: N/A;   CARDIOVERSION N/A 05/19/2022   Procedure: CARDIOVERSION;  Surgeon: Perla Evalene PARAS, MD;  Location: [facility]S;  Service: Cardiovascular;  Laterality: N/A;   CARDIOVERSION N/A 07/25/2022   Procedure: CARDIOVERSION;  Surgeon: Fernande Elspeth BROCKS, MD;  Location: [facility]S;  Service: Cardiovascular;  Laterality: N/A;   CARDIOVERSION N/A 09/21/2022   Procedure: CARDIOVERSION;  Surgeon: Fernande Elspeth BROCKS, MD;  Location: [facility];  Service: Cardiovascular;  Laterality: N/A;   CARPAL TUNNEL RELEASE Right 12/23/2014   Procedure: CARPAL TUNNEL RELEASE;  Surgeon: Kayla Pinal, MD;  Location: [facility];  Service: Orthopedics;  Laterality: Right;   CARPAL TUNNEL RELEASE Left 01/06/2015   Procedure: CARPAL TUNNEL RELEASE;  Surgeon: Kayla Pinal, MD;  Location: [facility]S;  Service: Orthopedics;  Laterality: Left;   COLONOSCOPY WITH PROPOFOL  N/A 10/08/2017   Procedure: COLONOSCOPY WITH PROPOFOL ;  Surgeon: Unk Corinn Skiff, MD;  Location: [facility] ENDOSCOPY;  Service: Gastroenterology;  Laterality: N/A;   COLONOSCOPY WITH PROPOFOL  N/A 06/07/2022   Procedure: COLONOSCOPY WITH PROPOFOL ;  Surgeon: Unk Corinn Skiff, MD;  Location: [facility] ENDOSCOPY;  Service: Gastroenterology;  Laterality: N/A;    ENTEROSCOPY  06/07/2022   Procedure: ENTEROSCOPY;  Surgeon: Unk Corinn Skiff, MD;  Location: [facility] ENDOSCOPY;  Service: Gastroenterology;;   ESOPHAGOGASTRODUODENOSCOPY (EGD) WITH PROPOFOL  N/A 06/07/2022   Procedure: ESOPHAGOGASTRODUODENOSCOPY (EGD) WITH PROPOFOL ;  Surgeon: Unk Corinn Skiff, MD;  Location: [facility] ENDOSCOPY;  Service: Gastroenterology;  Laterality: N/A;   FLEXIBLE SIGMOIDOSCOPY N/A 11/02/2023   Procedure: KINGSTON SIDE;  Surgeon: Unk Corinn Skiff, MD;  Location: [facility] ENDOSCOPY;  Service: Gastroenterology;  Laterality: N/A;   GASTRIC BYPASS  01/17/2000   KNEE SURGERY Right    knee trauma x3   prostate seeding     REVERSE SHOULDER ARTHROPLASTY Right 09/27/2021   Procedure: Right reverse shoulder arthroplasty, biceps tenodesis;  Surgeon: Tobie Priest, MD;  Location: [facility]S;  Service: Orthopedics;  Laterality: Right;   REVERSE SHOULDER ARTHROPLASTY Left 02/20/2023   Procedure: Left reverse shoulder arthroplasty, biceps tenodesis;  Surgeon: Tobie Priest, MD;  Location: [facility] ORS;  Service: Orthopedics;  Laterality: Left;   SHOULDER ARTHROSCOPY WITH ROTATOR CUFF REPAIR AND OPEN BICEPS TENODESIS Left 12/27/2021   Procedure: Left shoulder arthroscopic cuff repair (supraspinatus and subscapularis) with Regeneten Patch application;  Surgeon: Tobie Priest, MD;  Location: [facility] ORS;  Service: Orthopedics;  Laterality: Left;   Family History  Problem Relation Age of Onset   Heart disease Father    Hypertension Father    Stroke Father    Cancer Father        prostate cancer   Cancer Sister    Arrhythmia Sister        A-fib   Arrhythmia Brother        A-fib   Cancer Brother    Social History   Tobacco Use   Smoking status: Former    Current packs/day: 0.00    Average packs/day: 1 pack/day for 10.0 years (10.0 ttl pk-yrs)    Types: Cigarettes    Start date: 02/17/1969    Quit date: 02/18/1979    Years since quitting: 44.9   Smokeless tobacco: Former    Types: Snuff   Substance Use Topics   Alcohol  use: Not Currently    Comment: last drink in 2021   LABS:  Appointment on 12/26/2023  Component Date Value Ref Range Status   Ferritin 12/26/2023 101  24 - 336 ng/mL Final   Performed at [facility], [address] [place], [place] [address]   Iron  12/26/2023 59  45 - 182 ug/dL Final   TIBC 87/89/7974 328  250 - 450 ug/dL Final   Saturation Ratios 12/26/2023 18  17.9 - 39.5 % Final   UIBC 12/26/2023 269  ug/dL Final   Performed at [facility], [address] [place], [place] [address]   Sodium 12/26/2023 141  135 - 145 mmol/L Final   Potassium 12/26/2023 4.5  3.5 - 5.1 mmol/L Final   Chloride 12/26/2023 106  98 - 111 mmol/L Final   CO2 12/26/2023 23  22 - 32 mmol/L Final   Glucose, Bld 12/26/2023 104 (H)  70 - 99 mg/dL Final   Glucose reference range applies only to samples taken after fasting for at least 8 hours.   BUN 12/26/2023 21  8 - 23 mg/dL Final   Creatinine 87/89/7974 1.05  0.61 - 1.24 mg/dL Final   Calcium  12/26/2023 9.4  8.9 - 10.3 mg/dL Final   GFR, Estimated 12/26/2023 >60  >60 mL/min Final   Comment: (NOTE) Calculated using the CKD-EPI Creatinine Equation (2021)    Anion gap 12/26/2023 12  5 - 15 Final   Performed at [facility], [address] [place], [place] [address]   WBC Count 12/26/2023 7.5  4.0 - 10.5 K/uL Final   RBC 12/26/2023 5.16  4.22 - 5.81 MIL/uL Final   Hemoglobin 12/26/2023 14.9  13.0 - 17.0 g/dL Final   HCT 87/89/7974 44.9  39.0 - 52.0 % Final   MCV 12/26/2023 87.0  80.0 - 100.0 fL Final   MCH 12/26/2023 28.9  26.0 - 34.0 pg Final   MCHC 12/26/2023 33.2  30.0 - 36.0 g/dL Final   RDW 87/89/7974 13.1  11.5 - 15.5 % Final   Platelet Count 12/26/2023 195  150 - 400 K/uL Final   nRBC 12/26/2023 0.0  0.0 - 0.2 % Final   Neutrophils Relative % 12/26/2023 59  % Final   Neutro Abs 12/26/2023 4.5  1.7 - 7.7 K/uL Final   Lymphocytes Relative 12/26/2023 27  % Final   Lymphs Abs 12/26/2023  2.0  0.7 - 4.0 K/uL Final   Monocytes Relative 12/26/2023 11  % Final   Monocytes Absolute 12/26/2023 0.8  0.1 - 1.0 K/uL Final   Eosinophils Relative 12/26/2023 1  % Final   Eosinophils Absolute 12/26/2023 0.1  0.0 - 0.5 K/uL Final   Basophils Relative 12/26/2023 1  % Final   Basophils Absolute 12/26/2023 0.0  0.0 - 0.1 K/uL Final   Immature Granulocytes 12/26/2023 1  % Final   Abs Immature Granulocytes 12/26/2023 0.04  0.00 - 0.07 K/uL Final   Performed at [facility], [address] [place], [place] [address]  Procedure visit on 12/24/2023  Component Date Value Ref Range Status   Specific Gravity, UA 12/24/2023 1.030  1.005 - 1.030 Final        >=1.030   pH, UA 12/24/2023 5.5  5.0 - 7.5 Final   Color, UA 12/24/2023 Yellow  Yellow Final   Appearance Ur 12/24/2023 Clear  Clear Final   Leukocytes,UA 12/24/2023 Negative  Negative Final   Protein,UA 12/24/2023 Negative  Negative/Trace Final   Glucose, UA 12/24/2023 Negative  Negative Final   Ketones, UA 12/24/2023 Negative  Negative Final   RBC, UA 12/24/2023 3+ (A)  Negative Final   Bilirubin, UA 12/24/2023 Negative  Negative Final   Urobilinogen, Ur 12/24/2023 0.2  0.2 - 1.0 mg/dL Final   Nitrite, UA 87/91/7974 Negative  Negative Final   Microscopic Examination 12/24/2023 See below:   Final   Microscopic was indicated and was performed.   WBC, UA 12/24/2023 0-5  0 - 5 /hpf Final   RBC, Urine 12/24/2023 >30 (A)  0 - 2 /hpf Final   Epithelial Cells (non renal) 12/24/2023 0-10  0 - 10 /hpf Final   Bacteria, UA 12/24/2023 Few  None seen/Few Final    ECG: Date: 12/27/2023  Time ECG obtained: 1443 PM Rate: 92 bpm Rhythm: atrial fibrillation Axis (leads I and aVF): normal Intervals: QRS 82 ms. QTc 435 ms. ST segment and T wave changes: No evidence of acute T wave abnormalities or significant ST segment elevation or depression.  Evidence of a possible, age undetermined, prior infarct:  No Comparison: Similar to previous  tracing obtained on 11/28/2023   IMAGING / PROCEDURES: LONG TERM CARDIAC EVENT MONITOR STUDY performed on 12/12/2023 Patch Wear Time:  6 days and 21 hours (2025-11-14T21:53:50-0500 to 2025-11-21T19:38:09-498) HR 51 - 164, average 86 bpm. 1 nonsustained VT (longest 4 beats). Atrial flutter detected. 100% burden, occurring continuously throughout the monitoring period. No supraventricular ectopy due to AFL. Rare ventricular ectopy. Symptom trigger episodes correspond to predominantly atrial fibrillation/flutter with rapid rates.  CT RENAL STONE STUDY performed on 11/27/2023 No acute findings in the abdomen/pelvis. Moderate diverticulosis of the colon with resolution of the previously seen acute diverticulitis over the rectosigmoid junction. Moderate cholelithiasis. 2 mm nonobstructing left renal stone. 1.1 cm exophytic hyperdense mass over the upper pole left kidney unchanged and likely due to a hyperdense cyst, although indeterminate. Recommend follow-up renal ultrasound for further evaluation Small left inguinal hernia containing only mesenteric fat. Aortic atherosclerosis  CT CARDIAC SCORING performed on 09/11/2023 Coronary calcium  score of 812. This was 81st percentile for age and sex matched control. Mildly dilated aortic root.  TRANSTHORACIC ECHOCARDIOGRAM performed on 06/02/2022 Left ventricular ejection fraction, by estimation, is 55 to 60%. Left ventricular ejection fraction by 2D MOD biplane is 58.3 %. The left ventricle has normal function. The left ventricle has no regional wall motion abnormalities. Left ventricular diastolic parameters are indeterminate.  Right ventricular systolic function is normal. The right ventricular size is normal. There is mildly elevated pulmonary artery systolic pressure. The estimated right ventricular systolic pressure is 37.9 mmHg.  Left atrial size was severely dilated.  The mitral valve is normal in structure. Mild to moderate mitral valve  regurgitation. No evidence of mitral stenosis.  The aortic valve has an indeterminant number of cusps. Aortic valve regurgitation is mild. Aortic valve sclerosis is present, with no evidence of aortic valve stenosis.  There is borderline dilatation of the ascending aorta, measuring 38 mm.  The inferior vena cava is normal in size with greater than 50% respiratory variability, suggesting right atrial pressure of 3 mmHg.   IMPRESSION AND PLAN: DELANE WESSINGER has been referred for pre-anesthesia review and clearance prior to him undergoing the planned anesthetic and procedural courses. Available labs, pertinent testing, and imaging results were personally reviewed by me in preparation for upcoming operative/procedural course. [facility] medical record has been updated following extensive record review and patient interview with PAT staff.   ***Preopfix ***PPM  ATTENTION --> PENDING CLEARANCE AT THIS TIME -- NOTE/CONTENTS NOT FINAL UNTIL SIGNED This patient has been appropriately cleared by cardiology with an overall *** risk of patient experiencing significant perioperative cardiovascular complications. here at [facility]. Based on clinical review performed today (01/04/2024), barring any significant acute changes in the patient's overall condition, it is anticipated that he will be able to proceed with the planned surgical intervention. Any acute changes in clinical condition may necessitate his procedure being postponed and/or cancelled. Patient will meet with anesthesia team (MD and/or CRNA) on the day of his procedure for preoperative evaluation/assessment. Questions regarding anesthetic course  will be fielded at that time.   Pre-surgical instructions were reviewed with the patient during his PAT appointment, and questions were fielded to satisfaction by PAT clinical staff. He has been instructed on which medications that he will need to hold prior to surgery, as well  as the ones that have been deemed safe/appropriate to take on the day of his procedure. As part of the general education provided by PAT, patient made aware both verbally and in writing, that he would need to abstain from the use of any illegal substances during his perioperative course. He was advised that failure to follow the provided instructions could necessitate case cancellation or result in serious perioperative complications up to and including death. Patient encouraged to contact PAT and/or his surgeon's office to discuss any questions or concerns that may arise prior to surgery; verbalized understanding.   Dorise Pereyra, MSN, APRN, FNP-C, CEN [facility]  Perioperative Services Nurse Practitioner Phone: [phone] Fax: [phone] 01/04/2024 8:03 AM  NOTE: This note has been prepared using Dragon dictation software. Despite my best ability to proofread, there is always the potential that unintentional transcriptional errors may still occur from this process.    [1]  Allergies Allergen Reactions   Bupivacaine  Liposome Palpitations    Pt was given this for shoulder surgery in 2023 and states heart was pounding   "

## 2024-01-05 ENCOUNTER — Encounter: Payer: Self-pay | Admitting: Family Medicine

## 2024-01-05 DIAGNOSIS — G4700 Insomnia, unspecified: Secondary | ICD-10-CM

## 2024-01-07 ENCOUNTER — Telehealth: Payer: Self-pay

## 2024-01-07 ENCOUNTER — Encounter

## 2024-01-07 MED ORDER — ZOLPIDEM TARTRATE ER 6.25 MG PO TBCR: 6.2500 mg | EXTENDED_RELEASE_TABLET | Freq: Every evening | ORAL | 0 refills | Status: DC | PRN

## 2024-01-07 NOTE — Telephone Encounter (Signed)
 Copied from CRM [id number]. Topic: General - Other >> Jan 07, 2024  4:15 PM Rosina BIRCH wrote: Reason for RMF:[id number] called stating the cardiologist want him to stop taking trazodone  and he want doctor K to prescribe something in replace of the trazodone . The cardiologist is prescribing tikosyn to him [phone]

## 2024-01-07 NOTE — Telephone Encounter (Addendum)
 This appears to be a duplicate message. I have already talked to the patient on the phone and mychart message to solve this one.  Here is a copy of my note  ----------------  Taper off Trazodone  150mg  to 75mg , alternate each night 1 tab then next night half tab for 1 week, then go to half tab nightly for 7 days then discontinue entirely.   Start Zolpidem  CR continued release 6.25mg  nightly for now and let me know if it is successful, and we can add refills or adjust to 12.5mg  dose, we do have to be cautious with pain meds and this as both are controlled and have sedative properties.

## 2024-01-08 ENCOUNTER — Telehealth: Payer: Self-pay

## 2024-01-08 MED ORDER — CIPROFLOXACIN HCL 250 MG PO TABS: 250.0000 mg | ORAL_TABLET | Freq: Two times a day (BID) | ORAL | 0 refills | Status: DC

## 2024-01-08 NOTE — Telephone Encounter (Signed)
-----   Message from Glendia Barba, MD sent at 01/08/2024 12:51 PM EST ----- Regarding: Preop urine Preop urine PCR was positive.  Please send Rx Cipro  to 250 mg twice daily x 5 days to start 01/11/24 if he is presently not having any UTI symptoms

## 2024-01-08 NOTE — Telephone Encounter (Signed)
 Patient transitioning from Trazodone  to Ambien  per PCP recommendations.

## 2024-01-08 NOTE — Telephone Encounter (Signed)
 Spoke with patient. Patient advised of all results and verbalized understanding. Patient medication sent to [facility]. Patient aware. Will start medication on Friday 12/26.

## 2024-01-11 ENCOUNTER — Other Ambulatory Visit (HOSPITAL_COMMUNITY): Payer: Self-pay

## 2024-01-11 ENCOUNTER — Telehealth: Payer: Self-pay | Admitting: Pharmacy Technician

## 2024-01-11 NOTE — Telephone Encounter (Signed)
 Pharmacy Patient Advocate Encounter   Received notification from Onbase that prior authorization for Zolpidem  Tartrate ER 6.25MG  er tablets is required/requested.   Insurance verification completed.   The patient is insured through [place].   Per test claim:  ZOLPIDEM  TABS;ESZOPICLONE;ZALEPLON;RAMELTEON,BELSOMRA  is preferred by the insurance.  If suggested medication is appropriate, Please send in a new RX and discontinue this one. If not, please advise as to why it's not appropriate so that we may request a Prior Authorization. Please note, some preferred medications may still require a PA.  If the suggested medications have not been trialed and there are no contraindications to their use, the PA will not be submitted, as it will not be approved.  CMM KEY# [id number]

## 2024-01-11 NOTE — Telephone Encounter (Signed)
 Please notify patient that the Zolpidem  CR 6.25mg  extended release version is not preferred by his insurance, it may not be covered even if we submit the request to his insurance. The short acting version or immediate release version is preferred.  If he prefers, he may still keep the Zolpidem  CR version, but he may need to cover the rx cost out of pocket with [email or web address] coupon he can sign up for. Often it is a reasonable price for this medication, but let me know which option he prefers  Marsa Officer, DO [facility] 01/11/2024, 2:24 PM

## 2024-01-11 NOTE — Telephone Encounter (Signed)
 Called patient to confirm. He has 1 more week to taper off Trazodone . He has not filled Zolpidem  CR yet. He says insurance will change 01/17/24, he will update pharmacy w/ new insurance, and let me know after 01/17/24 if they can fill it if its covered or if he needs me to re order to try for the coverage ,if not then he can pick it up cash pay w/ [email or web address] coupon  Marsa Officer, DO [facility] 01/11/2024, 5:50 PM

## 2024-01-14 ENCOUNTER — Other Ambulatory Visit: Payer: Self-pay | Admitting: Family Medicine

## 2024-01-14 ENCOUNTER — Ambulatory Visit: Payer: Self-pay | Admitting: Urgent Care

## 2024-01-14 ENCOUNTER — Other Ambulatory Visit (HOSPITAL_COMMUNITY): Payer: Self-pay

## 2024-01-14 ENCOUNTER — Other Ambulatory Visit: Payer: Self-pay | Admitting: Cardiovascular Disease

## 2024-01-14 ENCOUNTER — Encounter: Payer: Self-pay | Admitting: Urology

## 2024-01-14 ENCOUNTER — Encounter
Admission: RE | Disposition: A | Discharge status: Home / Self Care | Payer: Self-pay | Source: Home / Self Care | Attending: Urology

## 2024-01-14 ENCOUNTER — Ambulatory Visit
Admission: RE | Admit: 2024-01-14 | Discharge: 2024-01-14 | Disposition: A | Discharge status: Home / Self Care | Attending: Urology | Admitting: Urology

## 2024-01-14 ENCOUNTER — Encounter

## 2024-01-14 ENCOUNTER — Other Ambulatory Visit: Payer: Self-pay

## 2024-01-14 DIAGNOSIS — I5032 Chronic diastolic (congestive) heart failure: Secondary | ICD-10-CM | POA: Insufficient documentation

## 2024-01-14 DIAGNOSIS — M5136 Other intervertebral disc degeneration, lumbar region with discogenic back pain only: Secondary | ICD-10-CM

## 2024-01-14 DIAGNOSIS — G894 Chronic pain syndrome: Secondary | ICD-10-CM | POA: Diagnosis not present

## 2024-01-14 DIAGNOSIS — I2510 Atherosclerotic heart disease of native coronary artery without angina pectoris: Secondary | ICD-10-CM | POA: Insufficient documentation

## 2024-01-14 DIAGNOSIS — Z7901 Long term (current) use of anticoagulants: Secondary | ICD-10-CM | POA: Insufficient documentation

## 2024-01-14 DIAGNOSIS — E66813 Obesity, class 3: Secondary | ICD-10-CM | POA: Insufficient documentation

## 2024-01-14 DIAGNOSIS — G4700 Insomnia, unspecified: Secondary | ICD-10-CM

## 2024-01-14 DIAGNOSIS — M62830 Muscle spasm of back: Secondary | ICD-10-CM

## 2024-01-14 DIAGNOSIS — D494 Neoplasm of unspecified behavior of bladder: Secondary | ICD-10-CM

## 2024-01-14 DIAGNOSIS — Z87891 Personal history of nicotine dependence: Secondary | ICD-10-CM | POA: Insufficient documentation

## 2024-01-14 DIAGNOSIS — Z9884 Bariatric surgery status: Secondary | ICD-10-CM | POA: Diagnosis not present

## 2024-01-14 DIAGNOSIS — Z6841 Body Mass Index (BMI) 40.0 and over, adult: Secondary | ICD-10-CM | POA: Insufficient documentation

## 2024-01-14 DIAGNOSIS — I480 Paroxysmal atrial fibrillation: Secondary | ICD-10-CM | POA: Insufficient documentation

## 2024-01-14 DIAGNOSIS — I110 Hypertensive heart disease with heart failure: Secondary | ICD-10-CM | POA: Diagnosis not present

## 2024-01-14 DIAGNOSIS — C679 Malignant neoplasm of bladder, unspecified: Secondary | ICD-10-CM | POA: Diagnosis not present

## 2024-01-14 DIAGNOSIS — F419 Anxiety disorder, unspecified: Secondary | ICD-10-CM

## 2024-01-14 HISTORY — PX: TRANSURETHRAL RESECTION OF BLADDER TUMOR: SHX2575

## 2024-01-14 HISTORY — DX: Thoracic aortic ectasia: I77.810

## 2024-01-14 HISTORY — PX: BLADDER INSTILLATION: SHX6893

## 2024-01-14 HISTORY — DX: Neoplasm of unspecified behavior of bladder: D49.4

## 2024-01-14 HISTORY — DX: Calculus of gallbladder without cholecystitis without obstruction: K80.20

## 2024-01-14 SURGERY — TURBT (TRANSURETHRAL RESECTION OF BLADDER TUMOR)
Anesthesia: General

## 2024-01-14 MED ORDER — LIDOCAINE HCL (CARDIAC) PF 100 MG/5ML IV SOSY
PREFILLED_SYRINGE | INTRAVENOUS | Status: DC | PRN
  Administered 2024-01-14: 100 mg via INTRAVENOUS

## 2024-01-14 MED ORDER — OXYCODONE HCL 5 MG PO TABS
5.0000 mg | ORAL_TABLET | Freq: Once | ORAL | Status: AC | PRN
  Administered 2024-01-14: 5 mg via ORAL

## 2024-01-14 MED ORDER — STERILE WATER FOR IRRIGATION IR SOLN
Status: DC | PRN
  Administered 2024-01-14: 1000 mL

## 2024-01-14 MED ORDER — CHLORHEXIDINE GLUCONATE 0.12 % MT SOLN
15.0000 mL | Freq: Once | OROMUCOSAL | Status: AC
  Administered 2024-01-14: 15 mL via OROMUCOSAL

## 2024-01-14 MED ORDER — ACETAMINOPHEN 500 MG PO TABS
1000.0000 mg | ORAL_TABLET | Freq: Once | ORAL | Status: AC
  Administered 2024-01-14: 1000 mg via ORAL

## 2024-01-14 MED ORDER — PROPOFOL 10 MG/ML IV BOLUS
INTRAVENOUS | Status: AC
  Filled 2024-01-14: qty 80

## 2024-01-14 MED ORDER — CEFAZOLIN SODIUM-DEXTROSE 3-4 GM/150ML-% IV SOLN
3.0000 g | INTRAVENOUS | Status: AC
  Administered 2024-01-14: 3 g via INTRAVENOUS
  Filled 2024-01-14: qty 150

## 2024-01-14 MED ORDER — OXYCODONE HCL 5 MG/5ML PO SOLN: 5.0000 mg | Freq: Once | ORAL | Status: AC | PRN

## 2024-01-14 MED ORDER — FENTANYL CITRATE (PF) 100 MCG/2ML IJ SOLN
INTRAMUSCULAR | Status: DC | PRN
  Administered 2024-01-14: 100 ug via INTRAVENOUS

## 2024-01-14 MED ORDER — LACTATED RINGERS IV SOLN: INTRAVENOUS | Status: DC

## 2024-01-14 MED ORDER — MIDAZOLAM HCL (PF) 2 MG/2ML IJ SOLN
INTRAMUSCULAR | Status: DC | PRN
  Administered 2024-01-14: 2 mg via INTRAVENOUS

## 2024-01-14 MED ORDER — DEXMEDETOMIDINE HCL IN NACL 80 MCG/20ML IV SOLN
INTRAVENOUS | Status: DC | PRN
  Administered 2024-01-14 (×2): 20 ug via INTRAVENOUS

## 2024-01-14 MED ORDER — FENTANYL CITRATE (PF) 100 MCG/2ML IJ SOLN
INTRAMUSCULAR | Status: AC
  Filled 2024-01-14: qty 2

## 2024-01-14 MED ORDER — CELECOXIB 200 MG PO CAPS
200.0000 mg | ORAL_CAPSULE | Freq: Once | ORAL | Status: AC
  Administered 2024-01-14: 200 mg via ORAL

## 2024-01-14 MED ORDER — CELECOXIB 200 MG PO CAPS
ORAL_CAPSULE | ORAL | Status: AC
  Filled 2024-01-14: qty 1

## 2024-01-14 MED ORDER — FENTANYL CITRATE (PF) 100 MCG/2ML IJ SOLN
25.0000 ug | INTRAMUSCULAR | Status: DC | PRN
  Administered 2024-01-14: 25 ug via INTRAVENOUS
  Administered 2024-01-14 (×2): 50 ug via INTRAVENOUS
  Administered 2024-01-14: 25 ug via INTRAVENOUS
  Administered 2024-01-14: 50 ug via INTRAVENOUS

## 2024-01-14 MED ORDER — PROPOFOL 10 MG/ML IV BOLUS
INTRAVENOUS | Status: DC | PRN
  Administered 2024-01-14: 150 mg via INTRAVENOUS
  Administered 2024-01-14: 150 ug/kg/min via INTRAVENOUS

## 2024-01-14 MED ORDER — PROPOFOL 1000 MG/100ML IV EMUL
INTRAVENOUS | Status: AC
  Filled 2024-01-14: qty 100

## 2024-01-14 MED ORDER — GEMCITABINE CHEMO FOR BLADDER INSTILLATION 2000 MG
INTRAVENOUS | Status: DC | PRN
  Administered 2024-01-14: 2000 mg via INTRAVESICAL

## 2024-01-14 MED ORDER — CHLORHEXIDINE GLUCONATE 0.12 % MT SOLN
OROMUCOSAL | Status: AC
  Filled 2024-01-14: qty 15

## 2024-01-14 MED ORDER — ACETAMINOPHEN 500 MG PO TABS
ORAL_TABLET | ORAL | Status: AC
  Filled 2024-01-14: qty 2

## 2024-01-14 MED ORDER — GEMCITABINE CHEMO FOR BLADDER INSTILLATION 2000 MG
2000.0000 mg | Freq: Once | INTRAVENOUS | Status: DC
  Filled 2024-01-14: qty 52.6

## 2024-01-14 MED ORDER — DROPERIDOL 2.5 MG/ML IJ SOLN: 0.6250 mg | Freq: Once | INTRAMUSCULAR | Status: DC | PRN

## 2024-01-14 MED ORDER — ACETAMINOPHEN 10 MG/ML IV SOLN: 1000.0000 mg | Freq: Once | INTRAVENOUS | Status: DC | PRN

## 2024-01-14 MED ORDER — CEFAZOLIN SODIUM-DEXTROSE 2-4 GM/100ML-% IV SOLN
INTRAVENOUS | Status: AC
  Filled 2024-01-14: qty 100

## 2024-01-14 MED ORDER — OXYCODONE HCL 5 MG PO TABS
ORAL_TABLET | ORAL | Status: AC
  Filled 2024-01-14: qty 1

## 2024-01-14 MED ORDER — ORAL CARE MOUTH RINSE: 15.0000 mL | Freq: Once | OROMUCOSAL | Status: AC

## 2024-01-14 MED ORDER — SODIUM CHLORIDE 0.9 % IR SOLN
Status: DC | PRN
  Administered 2024-01-14: 6000 mL

## 2024-01-14 MED ORDER — MIDAZOLAM HCL 2 MG/2ML IJ SOLN
INTRAMUSCULAR | Status: AC
  Filled 2024-01-14: qty 2

## 2024-01-14 MED ORDER — PHENYLEPHRINE 80 MCG/ML (10ML) SYRINGE FOR IV PUSH (FOR BLOOD PRESSURE SUPPORT)
PREFILLED_SYRINGE | INTRAVENOUS | Status: DC | PRN
  Administered 2024-01-14: 80 ug via INTRAVENOUS

## 2024-01-14 MED ORDER — PHENYLEPHRINE 80 MCG/ML (10ML) SYRINGE FOR IV PUSH (FOR BLOOD PRESSURE SUPPORT)
PREFILLED_SYRINGE | INTRAVENOUS | Status: AC
  Filled 2024-01-14: qty 20

## 2024-01-14 SURGICAL SUPPLY — 19 items
BAG DRAIN SIEMENS DORNER NS (MISCELLANEOUS) ×1 IMPLANT
BAG URINE DRAIN 2000ML AR STRL (UROLOGICAL SUPPLIES) ×1 IMPLANT
BRUSH SCRUB EZ 4% CHG (MISCELLANEOUS) IMPLANT
CATH FOL 2WAY LX 18X30 (CATHETERS) ×1 IMPLANT
DRAPE UTILITY 15X26 TOWEL STRL (DRAPES) ×1 IMPLANT
DRSG TELFA 3X4 N-ADH STERILE (GAUZE/BANDAGES/DRESSINGS) ×1 IMPLANT
ELECTRODE LOOP 22F BIPOLAR SML (ELECTROSURGICAL) IMPLANT
ELECTRODE REM PT RTRN 9FT ADLT (ELECTROSURGICAL) IMPLANT
GLOVE BIOGEL PI IND STRL 7.5 (GLOVE) ×1 IMPLANT
GOWN STRL REUS W/ TWL LRG LVL3 (GOWN DISPOSABLE) ×1 IMPLANT
GOWN STRL REUS W/TWL XL LVL4 (GOWN DISPOSABLE) ×1 IMPLANT
KIT TURNOVER CYSTO (KITS) ×1 IMPLANT
LOOP CUT BIPOLAR 24F LRG (ELECTROSURGICAL) IMPLANT
PACK CYSTO AR (MISCELLANEOUS) ×1 IMPLANT
SET IRRIG Y-TYPE CYSTO (SET/KITS/TRAYS/PACK) ×1 IMPLANT
SOL .9 NS 3000ML IRR UROMATIC (IV SOLUTION) ×2 IMPLANT
SOLN STERILE WATER 500 ML (IV SOLUTION) ×1 IMPLANT
SURGILUBE 2OZ TUBE FLIPTOP (MISCELLANEOUS) ×1 IMPLANT
SYRINGE TOOMEY IRRIG 70ML (MISCELLANEOUS) ×1 IMPLANT

## 2024-01-14 NOTE — Transfer of Care (Signed)
 Immediate Anesthesia Transfer of Care Note  Patient: [name]  Procedure(s) Performed: TURBT (TRANSURETHRAL RESECTION OF BLADDER TUMOR) INSTILLATION, BLADDER  Patient Location: PACU  Anesthesia Type:General  Level of Consciousness: awake  Airway & Oxygen Therapy: Patient Spontanous Breathing and Patient connected to face mask oxygen  Post-op Assessment: Report given to RN and Post -op Vital signs reviewed and stable  Post vital signs: Reviewed and stable  Last Vitals:  Vitals Value Taken Time  BP 132/87 01/14/24 14:19  Temp    Pulse 91 01/14/24 14:20  Resp 15 01/14/24 14:20  SpO2 89 % 01/14/24 14:20  Vitals shown include unfiled device data.  Last Pain:  Vitals:   01/14/24 1053  PainSc: 0-No pain         Complications: There were no known notable events for this encounter.

## 2024-01-14 NOTE — Anesthesia Postprocedure Evaluation (Signed)
"   Anesthesia Post Note  Patient: [name]  Procedure(s) Performed: TURBT (TRANSURETHRAL RESECTION OF BLADDER TUMOR) INSTILLATION, BLADDER  Patient location during evaluation: PACU Anesthesia Type: General Level of consciousness: awake and alert Pain management: pain level controlled Vital Signs Assessment: post-procedure vital signs reviewed and stable Respiratory status: spontaneous breathing, nonlabored ventilation and respiratory function stable Cardiovascular status: blood pressure returned to baseline and stable Postop Assessment: no apparent nausea or vomiting Anesthetic complications: no   There were no known notable events for this encounter.   Last Vitals:  Vitals:   01/14/24 1525 01/14/24 1545  BP: (!) 131/90 (!) 173/91  Pulse: 73 96  Resp: 10 14  Temp:  (!) 36.4 C  SpO2: 97% 98%    Last Pain:  Vitals:   01/14/24 1545  TempSrc: Temporal  PainSc: 2                  [name]      "

## 2024-01-14 NOTE — Anesthesia Preprocedure Evaluation (Addendum)
 "                                  Anesthesia Evaluation  Patient identified by MRN, date of birth, ID band Patient awake    Reviewed: Allergy & Precautions, H&P , NPO status , Patient's Chart, lab work & pertinent test results, reviewed documented beta blocker date and time   Airway Mallampati: II  TM Distance: >3 FB Neck ROM: full    Dental  (+) Teeth Intact, Missing   Pulmonary shortness of breath and with exertion, former smoker 02/2017 CTA Chest: stable, benign, 7mm RLL pulm nodule.   Pulmonary exam normal        Cardiovascular Exercise Tolerance: Poor hypertension, On Medications + CAD and +CHF (in setting of afib)  + dysrhythmias Atrial Fibrillation + Valvular Problems/Murmurs (mild to mod per echo 05/2022) MR  Rhythm:regular Rate:Normal  Afib- ) s/p PVI ablation 11/17/22; d.) req to d/c amiodarone  12/2023 - plans for tikosyn early 2026; chronically anticoag on rivaroxaban   ECHO 06/02/2022: EF 55-60%  09/11/23 Coronary calcium  score 1. Coronary calcium  score of 812. This was 81st percentile for age  and sex matched control.  2. Mildly dilated aortic root.  CAC >300 in LAD, LCx. CAC-DRS A3/N2.  CAC-DRS A0: CAC score 0: Statin generally not recommended unless  other high risk condition (e.g., FH, DM2, tobacco use, etc.)  CAC-DRS A1: CAC score 1-99: moderate intensity statin  CAC-DRS A2: CAC score 100-299: moderate to high intensity statin +  ASA 81mg   CAC-DRS A3: CAC score > 300: high intensity statin + ASA 81mg  (He is not taking aspirin  due to taking Xarelto  for CVA prevention/Afib-flutter) Consider cardiology consultation.  Continue heart healthy lifestyle and risk factor modification.     Neuro/Psych  PSYCHIATRIC DISORDERS Anxiety     Chronic pain syndrome  Neuromuscular disease    GI/Hepatic Neg liver ROS, hiatal hernia,,,gastric bypass surgery H/o GIB 8/25- Diverticulitis    Endo/Other    Class 3 obesity  Renal/GU Renal InsufficiencyRenal  disease   Genitourinary malignancy (seed implants,dx 2014): Prostate    Musculoskeletal  (+) Arthritis ,    Abdominal  (+) + obese  Peds  Hematology  (+) Blood dyscrasia, anemia   Anesthesia Other Findings Past Medical History: No date: (HFpEF) heart failure with preserved ejection fraction (HCC)     Comment:  a.) TTE 12/26/2013: EF 45-50%, mild ant and antsept HK.               mild MR. Mod dil LA. nl RV fxn. Rhythm was Afib; b.) TTE               07/04/2019: EF 55%, mid-apical anteroseptal HK, mild MAC,              mild Ao sclerosis, G2DD, RVSP 45.3; c.) TTE 06/02/2022:               EF 55-60%, no RWMAs, sev LAE, RVSP 37.9, mild-mod MR,               mild AR, AoV sclerosis without stenosis, asc Ao 38 mm No date: Anxiety No date: Aortic atherosclerosis (HCC) No date: Cardiomyopathy (in setting of Afib)     Comment:  a.) TTE 12/26/2013: EF 45-50%; b.) TTE 07/04/2019: EF               55%; c.) TTE 06/02/2022: EF 55-60% No date: Chronic pain syndrome  Comment:  a.) followed by pain management No date: Chronic, continuous use of opioids     Comment:  a.) on COT; followed by pain management 11/06/2014: Coronary artery disease     Comment:  a.) cCTA 11/06/2014: Ca2+ = 224 (74th %ile; LAD               distribution) No date: Diverticulosis No date: DJD (degenerative joint disease) of knee No date: Former smoker No date: History of hiatal hernia 2012: History of kidney stones No date: Hyperlipidemia No date: Hyperplastic colon polyp No date: Hypertension No date: Inguinal hernia, left No date: Internal hemorrhoids No date: Intervertebral disc disorder with radiculopathy of  lumbosacral region No date: Lesion of bone of lumbosacral spine     Comment:  L5 No date: Long term current use of amiodarone  No date: Lower extremity edema No date: Mixed hyperlipidemia No date: Morbid obesity (HCC) No date: Multiple falls No date: Myalgia due to statin No date: On rivaroxaban   therapy No date: Osteoarthritis No date: PAF (paroxysmal atrial fibrillation) (HCC)     Comment:  a.) CHA2DS2VASc = 4 (age, HTN, CHF, vascular disease) as              of 02/19/23;  b.) s/p DCCV 03/13/14 (200 J), 05/15/19               (150 J x 1, 200 J x2), 05/19/2022 (150 J x1, 200 J x2;               pads changed to ant/lat postion with additional 200 J x 2              -> did not convert), 07/25/22 (200 J), 09/21/2022 (200               J); c.) s/p PVI ablation 11/17/2022; d.) rate/rhythm               maintained on amiodarone ; chronically anticoagulated on               rivaroxaban  No date: Pernicious anemia No date: Prostate cancer (HCC) No date: Pulmonary nodule, right     Comment:  a. 10/2014 Cardiac CTA: 7mm RLL nodule; b. 04/2015 CT               Chest: stable 7mm RLL nodule. No new nodules; 02/2017 CTA               Chest: stable, benign, 7mm RLL pulm nodule. No date: Rotator cuff tear, left No date: Sleep difficulties     Comment:  a.) takes melatonin + trazodone  PRN No date: SVT (supraventricular tachycardia) (HCC) No date: Tubular adenoma of colon Past Surgical History: 11/17/2022: ATRIAL FIBRILLATION ABLATION; N/A     Comment:  Procedure: ATRIAL FIBRILLATION ABLATION; Location: [facility];               Surgeon: Gehi, Anil, MD 09/27/2021: BICEPT TENODESIS; Right     Comment:  Procedure: Right reverse shoulder arthroplasty, biceps               tenodesis;  Surgeon: Tobie Priest, MD;  Location: [facility]               ORS;  Service: Orthopedics;  Laterality: Right; 05/15/2019: CARDIOVERSION; N/A     Comment:  Procedure: CARDIOVERSION;  Surgeon: Perla Evalene PARAS,               MD;  Location: [facility] ORS;  Service: Cardiovascular;  Laterality: N/A; 03/13/2014: CARDIOVERSION; N/A     Comment:  Procedure: CARDIOVERSION; Location: [facility]; Surgeon:               Evalene Lunger, MD 03/24/2022: CARDIOVERSION; N/A     Comment:  Procedure: CARDIOVERSION;  Surgeon: Lunger Evalene PARAS,               MD;  Location: [facility] ORS;  Service: Cardiovascular;                Laterality: N/A; 05/19/2022: CARDIOVERSION; N/A     Comment:  Procedure: CARDIOVERSION;  Surgeon: Lunger Evalene PARAS,               MD;  Location: [facility] ORS;  Service: Cardiovascular;                Laterality: N/A; 07/25/2022: CARDIOVERSION; N/A     Comment:  Procedure: CARDIOVERSION;  Surgeon: Fernande Elspeth BROCKS, MD;              Location: [facility] ORS;  Service: Cardiovascular;                Laterality: N/A; 09/21/2022: CARDIOVERSION; N/A     Comment:  Procedure: CARDIOVERSION;  Surgeon: Fernande Elspeth BROCKS, MD;              Location: [facility] ORS;  Service: Cardiovascular;                Laterality: N/A; 12/23/2014: CARPAL TUNNEL RELEASE; Right     Comment:  Procedure: CARPAL TUNNEL RELEASE;  Surgeon: Kayla Pinal, MD;  Location: [facility] ORS;  Service: Orthopedics;                Laterality: Right; 01/06/2015: CARPAL TUNNEL RELEASE; Left     Comment:  Procedure: CARPAL TUNNEL RELEASE;  Surgeon: Kayla Pinal, MD;  Location: [facility]S;  Service: Orthopedics;                Laterality: Left; 10/08/2017: COLONOSCOPY WITH PROPOFOL ; N/A     Comment:  Procedure: COLONOSCOPY WITH PROPOFOL ;  Surgeon: Unk Corinn Skiff, MD;  Location: [facility];  Service:               Gastroenterology;  Laterality: N/A; 06/07/2022: COLONOSCOPY WITH PROPOFOL ; N/A     Comment:  Procedure: COLONOSCOPY WITH PROPOFOL ;  Surgeon: Unk Corinn Skiff, MD;  Location: [facility];  Service:               Gastroenterology;  Laterality: N/A; 06/07/2022: ENTEROSCOPY     Comment:  Procedure: ENTEROSCOPY;  Surgeon: Unk Corinn Skiff,               MD;  Location: [facility];  Service:               Gastroenterology;; 06/07/2022: ESOPHAGOGASTRODUODENOSCOPY (EGD) WITH PROPOFOL ; N/A     Comment:  Procedure: ESOPHAGOGASTRODUODENOSCOPY (EGD) WITH               PROPOFOL ;  Surgeon: Unk Corinn Skiff, MD;  Location:               [facility];  Service: Gastroenterology;  Laterality:  N/A; 01/17/2000: GASTRIC BYPASS No date: KNEE SURGERY; Right     Comment:  knee trauma x3 No date: prostate seeding 09/27/2021: REVERSE SHOULDER ARTHROPLASTY; Right     Comment:  Procedure: Right reverse shoulder arthroplasty, biceps               tenodesis;  Surgeon: Tobie Priest, MD;  Location: [facility]               ORS;  Service: Orthopedics;  Laterality: Right; 12/27/2021: SHOULDER ARTHROSCOPY WITH ROTATOR CUFF REPAIR AND OPEN  BICEPS TENODESIS; Left     Comment:  Procedure: Left shoulder arthroscopic cuff repair               (supraspinatus and subscapularis) with Regeneten Patch               application;  Surgeon: Tobie Priest, MD;  Location: [facility]               ORS;  Service: Orthopedics;  Laterality: Left;   Reproductive/Obstetrics negative OB ROS                              Anesthesia Physical Anesthesia Plan  ASA: 3  Anesthesia Plan: General   Post-op Pain Management: Minimal or no pain anticipated   Induction: Intravenous  PONV Risk Score and Plan: 2 and TIVA, Propofol  infusion and Ondansetron   Airway Management Planned: LMA  Additional Equipment: None  Intra-op Plan:   Post-operative Plan: Extubation in OR  Informed Consent: I have reviewed the patients History and Physical, chart, labs and discussed the procedure including the risks, benefits and alternatives for the proposed anesthesia with the patient or authorized representative who has indicated his/her understanding and acceptance.     Dental advisory given  Plan Discussed with: CRNA and Surgeon  Anesthesia Plan Comments:          Anesthesia Quick Evaluation  "

## 2024-01-14 NOTE — Anesthesia Procedure Notes (Signed)
 Procedure Name: LMA Insertion Date/Time: 01/14/2024 1:23 PM  Performed by: Garner Dullea R, CRNAPre-anesthesia Checklist: Patient identified, Emergency Drugs available, Suction available, Patient being monitored and Timeout performed Patient Re-evaluated:Patient Re-evaluated prior to induction Oxygen Delivery Method: Circle system utilized Preoxygenation: Pre-oxygenation with 100% oxygen Induction Type: IV induction LMA: LMA inserted LMA Size: 4.0 Number of attempts: 1 Placement Confirmation: positive ETCO2 and breath sounds checked- equal and bilateral Tube secured with: Tape Dental Injury: Teeth and Oropharynx as per pre-operative assessment

## 2024-01-14 NOTE — Discharge Instructions (Addendum)
 Transurethral Resection of Bladder Tumor (TURBT) or Bladder Biopsy   Definition:  Transurethral Resection of the Bladder Tumor is a surgical procedure used to diagnose and remove tumors within the bladder.   General instructions:     Your recent bladder surgery requires very little post hospital care but some definite precautions.  Despite the fact that no skin incisions were used, the area around the bladder incisions are raw and covered with scabs to promote healing and prevent bleeding. Certain precautions are needed to insure that the scabs are not disturbed over the next 2-4 weeks while the healing proceeds.  Because the raw surface inside your bladder and the irritating effects of urine you may expect frequency of urination and/or urgency (a stronger desire to urinate) and perhaps even getting up at night more often. This will usually resolve or improve slowly over the healing period. You may see some blood in your urine over the first 6 weeks. Do not be alarmed, even if the urine was clear for a while. Get off your feet and drink lots of fluids until clearing occurs. If you start to pass clots or don't improve call us.  Diet:  You may return to your normal diet immediately. Because of the raw surface of your bladder, alcohol, spicy foods, foods high in acid and drinks with caffeine may cause irritation or frequency and should be used in moderation. To keep your urine flowing freely and avoid constipation, drink plenty of fluids during the day (8-10 glasses). Tip: Avoid cranberry juice because it is very acidic.  Activity:  Your physical activity doesn't need to be restricted. However, if you are very active, you may see some blood in the urine. We suggest that you reduce your activity under the circumstances until the bleeding has stopped.  Bowels:  It is important to keep your bowels regular during the postoperative period. Straining with bowel movements can cause bleeding. A bowel  movement every other day is reasonable. Use a mild laxative if needed, such as milk of magnesia 2-3 tablespoons, or 2 Dulcolax tablets. Call if you continue to have problems. If you had been taking narcotics for pain, before, during or after your surgery, you may be constipated. Take a laxative if necessary.    Medication:  You should resume your pre-surgery medications unless told not to. In addition you may be given an antibiotic to prevent or treat infection. Antibiotics are not always necessary. All medication should be taken as prescribed until the bottles are finished unless you are having an unusual reaction to one of the drugs.   [facility] [address] [place], [place] [address] [phone]

## 2024-01-14 NOTE — Interval H&P Note (Signed)
 History and Physical Interval Note:  01/14/2024 1:04 PM  [name]  has presented today for surgery, with the diagnosis of Bladder Tumor.  The various methods of treatment have been discussed with the patient and family. After consideration of risks, benefits and other options for treatment, the patient has consented to  Procedures with comments: TURBT (TRANSURETHRAL RESECTION OF BLADDER TUMOR) (N/A) INSTILLATION, BLADDER (N/A) - Gemcitabine  as a surgical intervention.  The patient's history has been reviewed, patient examined, no change in status, stable for surgery.  I have reviewed the patient's chart and labs.  Questions were answered to the patient's satisfaction.    CV:RRR Lungs:clear   Glendia JAYSON Barba

## 2024-01-14 NOTE — Telephone Encounter (Signed)
 Prescription refill request for Xarelto  received.  Indication: PAF Last office visit: 12/27/23  GORMAN Needle NP Weight: 161.5kg Age: 68 Scr: 1.05 on 12/26/23 CrCl: 149.54  Based on above findings Xarelto  20mg  daily is the appropriate dose.  Refill approved.

## 2024-01-14 NOTE — Op Note (Signed)
" ° °  Preoperative diagnosis: Bladder tumor   Postoperative diagnosis:  Bladder tumor  Procedure:  Cystoscopy Transurethral resection of bladder tumor (2-5 cm) Instillation intravesical gemcitabine    Surgeon: Glendia C. Shanyia Stines, M.D.  Anesthesia: General  Complications: None  Intraoperative findings:  Bladder tumor: ~3 cm papillary tumor right mid posterior wall Urethra normal in caliber t stricture; moderate prostate enlargement with significant bladder neck elevation  EBL: Minimal  Specimens: Bladder tumor      Indication: [name] is a 68 y.o. male with a history of recurrent gross hematuria.  Upper tract imaging was negative and office cystoscopy remarkable for a 3 cm papillary bladder tumor.  Refer to the admission H&P for details.  After reviewing the management options for treatment, he elected to proceed with the above surgical procedure(s). We have discussed the potential benefits and risks of the procedure, side effects of the proposed treatment, the likelihood of the patient achieving the goals of the procedure, and any potential problems that might occur during the procedure or recuperation. Informed consent has been obtained.  Description of procedure:  The patient was taken to the operating room and general anesthesia was induced.  The patient was placed in the dorsal lithotomy position, prepped and draped in the usual sterile fashion, and preoperative antibiotics were administered. A preoperative time-out was performed.   A 26 French continuous-flow resectoscope sheath with visual obturator was lubricated and passed per urethra.  Panendoscopy was performed with findings as described above.  An Iglesias resectoscope with loop was then placed into the sheath.  The tumor was then resected using bipolar current working medial to lateral down to superficial muscle.  At the inferolateral aspect of the tumor and obturator reflex was encountered.  Inspection of the site  showed no visible fat but was in the deeper aspect of the muscle.  All tumor was completely resected down to superficial muscle.  Hemostasis was then obtained with cautery.  All specimen was removed via irrigation.  Repeat cystoscopy is performed and no additional tumors were identified.  An 73F Foley catheter was placed without difficulty and 10 mL of sterile water  was placed in the balloon.  The catheter tubing was clamped and 2000 mL of gemcitabine  was instilled into the catheter.  After anesthetic reversal he was transported to the PACU in stable condition.  Plan: After 1 hour dwell time the gemcitabine  will be drained. Due to the deeper muscle involvement with the obturator reflex will plan on leaving the Foley catheter indwelling 7 days   Lakishia Bourassa C. Trishna Cwik,  MD  "

## 2024-01-15 ENCOUNTER — Encounter: Payer: Self-pay | Admitting: Urology

## 2024-01-15 NOTE — Telephone Encounter (Signed)
 Requested medications are due for refill today.  yes  Requested medications are on the active medications list.  yes  Last refill. Cyclobenzaprine  02/08/2023 #90 3 rf, Flonase  10/20/2023 - historical  Future visit scheduled.   yes  Notes to clinic.  Cyclobenzaprine  is not delegated. Flonase  is historical.    Requested Prescriptions  Pending Prescriptions Disp Refills   cyclobenzaprine  (FLEXERIL ) 10 MG tablet [Pharmacy Med Name: CYCLOBENZAPRINE  10 MG TAB] 90 tablet 3    Sig: TAKE ONE TABLET BY MOUTH AT BEDTIME     Not Delegated - Analgesics:  Muscle Relaxants Failed - 01/15/2024  2:16 PM      Failed - This refill cannot be delegated      Passed - Valid encounter within last 6 months    Recent Outpatient Visits           3 months ago Acute diverticulitis   [place] [facility] [place], Marsa PARAS, DO   5 months ago Annual physical exam   [place] [facility] [place] Marsa PARAS, DO   8 months ago Acute cystitis with hematuria   [place] [facility] [place] Marsa PARAS, DO       Future Appointments             In 1 week Stoioff, Glendia BROCKS, MD [facility] [place]   In 4 weeks Riddle, Suzann, NP [place] [facility]             fluticasone  (FLONASE ) 50 MCG/ACT nasal spray [Pharmacy Med Name: FLUTICASONE  50 MCG SPRAY] 16 mL 1    Sig: USE TWO SPRAYS IN EACH NOSTRIL ONE TIME DAILY     Ear, Nose, and Throat: Nasal Preparations - Corticosteroids Passed - 01/15/2024  2:16 PM      Passed - Valid encounter within last 12 months    Recent Outpatient Visits           3 months ago Acute diverticulitis   [place] [place], Marsa PARAS, DO   5 months ago Annual physical exam   [place] [facility] [place], Marsa PARAS, DO   8 months ago Acute cystitis with hematuria   [place] [facility] [place] Marsa PARAS, DO       Future Appointments             In 1 week Stoioff, Glendia BROCKS, MD [facility] [place]   In 4 weeks Riddle, Suzann, NP [facility]            Signed Prescriptions Disp Refills   busPIRone  (BUSPAR ) 10 MG tablet 360 tablet 0    Sig: TAKE ONE TABLET BY MOUTH FOUR TIMES A DAY AS NEEDED FOR ANIXETY     Psychiatry: Anxiolytics/Hypnotics - Non-controlled Passed - 01/15/2024  2:16 PM      Passed - Valid encounter within last 12 months    Recent Outpatient Visits           3 months ago Acute diverticulitis   [place] [facility] [place], Marsa PARAS, DO   5 months ago Annual physical exam   [place] [facility] [place] Marsa PARAS, DO   8 months ago Acute cystitis with hematuria   [facility] [place] Marsa PARAS, DO       Future Appointments  In 1 week Stoioff, Glendia BROCKS, MD [facility] [place]   In 4 weeks Riddle, Suzann, NP [facility]             traZODone  (DESYREL ) 150 MG tablet 90 tablet 0    Sig: TAKE ONE TABLET BY MOUTH AT BEDTIME     Psychiatry: Antidepressants - Serotonin Modulator Passed - 01/15/2024  2:16 PM      Passed - Valid encounter within last 6 months    Recent Outpatient Visits           3 months ago Acute diverticulitis   [place] [facility] [place], Marsa PARAS, DO   5 months ago Annual physical exam   [place] [facility] [place] Marsa PARAS, DO   8 months ago Acute cystitis with hematuria   [place] [facility] [place], Marsa PARAS, DO       Future Appointments             In 1 week Stoioff, Glendia BROCKS, MD [facility] [place]   In 4 weeks Riddle, Suzann, NP [facility] at [facility]

## 2024-01-15 NOTE — Telephone Encounter (Signed)
 Requested Prescriptions  Pending Prescriptions Disp Refills   cyclobenzaprine  (FLEXERIL ) 10 MG tablet [Pharmacy Med Name: CYCLOBENZAPRINE  10 MG TAB] 90 tablet 3    Sig: TAKE ONE TABLET BY MOUTH AT BEDTIME     Not Delegated - Analgesics:  Muscle Relaxants Failed - 01/15/2024  2:15 PM      Failed - This refill cannot be delegated      Passed - Valid encounter within last 6 months    Recent Outpatient Visits           3 months ago Acute diverticulitis   [facility] [place], Marsa PARAS, DO   5 months ago Annual physical exam   [place] [facility] [place] Marsa PARAS, DO   8 months ago Acute cystitis with hematuria   [place] [facility] [place], Marsa PARAS, DO       Future Appointments             In 1 week Stoioff, Glendia BROCKS, MD [facility] [place]   In 4 weeks Riddle, Suzann, NP [place] [facility]             fluticasone  (FLONASE ) 50 MCG/ACT nasal spray [Pharmacy Med Name: FLUTICASONE  50 MCG SPRAY] 16 mL 1    Sig: USE TWO SPRAYS IN EACH NOSTRIL ONE TIME DAILY     Ear, Nose, and Throat: Nasal Preparations - Corticosteroids Passed - 01/15/2024  2:15 PM      Passed - Valid encounter within last 12 months    Recent Outpatient Visits           3 months ago Acute diverticulitis   [facility] [place], Marsa PARAS, DO   5 months ago Annual physical exam   [place] [facility] [place] Marsa PARAS, DO   8 months ago Acute cystitis with hematuria   [place] [facility] [place], Marsa PARAS, DO       Future Appointments             In 1 week Stoioff, Glendia BROCKS, MD [facility] [place]   In 4 weeks Riddle, Suzann, NP [place] [facility]             busPIRone  (BUSPAR ) 10 MG tablet [Pharmacy Med Name: BUSPIRONE  10 MG TAB[*]] 360 tablet 1    Sig: TAKE ONE TABLET BY  MOUTH FOUR TIMES A DAY AS NEEDED FOR ANIXETY     Psychiatry: Anxiolytics/Hypnotics - Non-controlled Passed - 01/15/2024  2:15 PM      Passed - Valid encounter within last 12 months    Recent Outpatient Visits           3 months ago Acute diverticulitis   [place] [facility] [place], Marsa PARAS, DO   5 months ago Annual physical exam   [place] [facility] [place] Marsa PARAS, DO   8 months ago Acute cystitis with hematuria    [place] [place] Marsa PARAS, DO       Future Appointments             In 1 week Stoioff, Glendia BROCKS, MD [facility] [place]   In 4 weeks Riddle, Suzann, NP  [facility]             traZODone  (DESYREL ) 150 MG tablet [Pharmacy Med Name: TRAZODONE  150 MG TAB[*]] 90  tablet 0    Sig: TAKE ONE TABLET BY MOUTH AT BEDTIME     Psychiatry: Antidepressants - Serotonin Modulator Passed - 01/15/2024  2:15 PM      Passed - Valid encounter within last 6 months    Recent Outpatient Visits           3 months ago Acute diverticulitis   [place] [facility] [place], Marsa PARAS, DO   5 months ago Annual physical exam   [place] [facility] [place] Marsa PARAS, DO   8 months ago Acute cystitis with hematuria   [place] [facility] [place], Marsa PARAS, DO       Future Appointments             In 1 week Stoioff, Glendia BROCKS, MD [facility] [place]   In 4 weeks Riddle, Suzann, NP [facility]

## 2024-01-16 ENCOUNTER — Telehealth: Payer: Self-pay

## 2024-01-16 ENCOUNTER — Encounter: Payer: Self-pay | Admitting: Urology

## 2024-01-16 ENCOUNTER — Ambulatory Visit (INDEPENDENT_AMBULATORY_CARE_PROVIDER_SITE_OTHER): Admitting: Physician Assistant

## 2024-01-16 ENCOUNTER — Encounter: Payer: Self-pay | Admitting: Physician Assistant

## 2024-01-16 ENCOUNTER — Encounter

## 2024-01-16 VITALS — BP 118/89 | HR 80 | Temp 97.9°F | Wt 348.3 lb

## 2024-01-16 DIAGNOSIS — R310 Gross hematuria: Secondary | ICD-10-CM | POA: Diagnosis not present

## 2024-01-16 LAB — SURGICAL PATHOLOGY

## 2024-01-16 MED ORDER — OXYBUTYNIN CHLORIDE ER 10 MG PO TB24: 10.0000 mg | ORAL_TABLET | Freq: Every day | ORAL | 0 refills | Status: DC

## 2024-01-16 NOTE — Telephone Encounter (Signed)
 Pt called in with c/o cath not draining. He is leaking profuse urine around catheter. Pt is on his way in to have us  take a look at his catheter.

## 2024-01-16 NOTE — Progress Notes (Signed)
 Patient presented to clinic urgently today with concerns of nondraining Foley catheter.  He describes intermittent senses of intense urinary urgency with decreased drainage in his catheter bag and large volume leakage around his catheter.  On arrival, there is about 150 mL of maroon urine without clots in his night bag.  The tubing is dry.  Bladder Irrigation  Due to catheter concern patient is present today for a bladder irrigation. Patient was cleaned and prepped in a sterile fashion. 250 mL of sterile water  was instilled into the bladder with a 70mL Toomey syringe through the catheter in place.  of urine return was cleared from the bladder with evacuation of <3ccs of clot material. Efflux cleared from maroon to pink-tinged with the procedure and the catheter irrigated easily. Upon completion, the catheter was draining well and was reattached to the night bag for drainage. Patient tolerated well.   Performed by: Westen Dinino, PA-C and Mathew Pinal, RN  Follow-up bladder scan did not show a significant residual.  I suspect his symptoms are due to bladder spasms and will start oxybutynin XL 10 mg daily.

## 2024-01-18 ENCOUNTER — Telehealth: Payer: Self-pay

## 2024-01-18 NOTE — Telephone Encounter (Signed)
 Called patient to follow up on triage message. Pt states he removed his catheter on his own on 12/31 due to extreme pain and catheter not draining. He deflated the balloon and removed catheter. After removal he states having significant amount of bleeding and clots come out and immediate relief. Pt states since then his bleeding has decreased significantly and is not only tinge of pink in urine when voiding. He states he can go normally now. Pt was instructed to keep follow up on 1/6 with Dr to have a PVR done. Pt also instructed to seek ER attention if he starts having significant bleeding, thick clotted urine, fevers, abdominal pain. Pt voiced understanding.

## 2024-01-22 ENCOUNTER — Encounter: Payer: Self-pay | Admitting: Urology

## 2024-01-22 ENCOUNTER — Encounter: Payer: Self-pay | Admitting: Internal Medicine

## 2024-01-22 ENCOUNTER — Ambulatory Visit: Admitting: Urology

## 2024-01-22 VITALS — BP 178/117 | HR 89 | Ht 72.0 in | Wt 348.0 lb

## 2024-01-22 DIAGNOSIS — R310 Gross hematuria: Secondary | ICD-10-CM

## 2024-01-22 DIAGNOSIS — C679 Malignant neoplasm of bladder, unspecified: Secondary | ICD-10-CM | POA: Diagnosis not present

## 2024-01-22 LAB — BLADDER SCAN AMB NON-IMAGING

## 2024-01-22 NOTE — Progress Notes (Signed)
 "  01/22/2024 3:02 PM   [name] Newer 18-Nov-1955 [id number]  Referring provider: [place] Marsa JINNY, DO [address] [place],  [place] [address]  Chief Complaint  Patient presents with   Other    HPI: [name] is a 69 y.o. male presents for postop follow-up.  His wife was with him today.  TURBT 01/14/2024 for a 3 cm right mid posterior wall bladder tumor which was completely resected.  Obturator reflex was encountered during resection and the deeper muscle was visualized but no fat.  It was recommended keeping a catheter in 5-7 days however he experienced intermittent bleeding with clots and removed his catheter on 01/16/2024 after catheter removal he voided several LUTS landed and had significant improvement in his hematuria.  Past few days his urine has been clear with occasional stringy clots.  No abdominal pain. Pathology: Resected tumor was a HG papillary urothelial carcinoma.  Muscle was present and not involved.  No lamina propria invasion was noted.   PMH: Past Medical History:  Diagnosis Date   (HFpEF) heart failure with preserved ejection fraction (HCC)    a.) TTE 12/26/2013: EF 45-50%, mild ant and antsept HK. mild MR. Mod dil LA. nl RV fxn. Rhythm was Afib; b.) TTE 07/04/2019: EF 55%, mid-apical anteroseptal HK, mild MAC, mild Ao sclerosis, G2DD, RVSP 45.3; c.) TTE 06/02/2022: EF 55-60%, no RWMAs, sev LAE, RVSP 37.9, mild-mod MR, mild AR, AoV sclerosis without stenosis, asc Ao 38 mm   Anxiety    Aortic atherosclerosis    Aortic root dilatation    Bladder tumor    Cardiomyopathy (in setting of Afib)    a.) TTE 12/26/2013: EF 45-50%; b.) TTE 07/04/2019: EF 55%; c.) TTE 06/02/2022: EF 55-60%   Cholelithiasis    Chronic pain syndrome    a.) followed by pain management   Chronic, continuous use of opioids    a.) on COT; followed by pain management   Coronary artery disease 11/06/2014   a.) cCTA 11/06/2014: Ca2+ = 224 (74th %ile; LAD distribution)   Diverticulosis     DJD (degenerative joint disease) of knee    Former smoker    History of hiatal hernia    History of kidney stones 2012   Hyperlipidemia    Hyperplastic colon polyp    Hypertension    Inguinal hernia, left    Internal hemorrhoids    Intervertebral disc disorder with radiculopathy of lumbosacral region    Lesion of bone of lumbosacral spine    L5   Lower extremity edema    Mixed hyperlipidemia    Morbid obesity (HCC)    Multiple falls    Myalgia due to statin    On rivaroxaban  therapy    Osteoarthritis    PAF (paroxysmal atrial fibrillation) (HCC)    a.) CHA2DS2VASc = 4 (age, HTN, HFpEF, vasc disease) as of 02/19/23;  b.) s/p DCCV 03/13/14 (200 J), 05/15/19 (150 J x 1, 200 J x2), 05/19/22 (150 J x1, 200 J x2; pads changed to ant/lat postion with additional 200 J x 2 -> did not convert), 07/25/22 (200 J), 09/21/22 (200 J); c.) s/p PVI ablation 11/17/22; d.) req to d/c amiodarone  12/2023 - plans for tikosyn early 2026; chronically anticoag on rivaroxaban    Pernicious anemia    Prostate cancer (HCC)    Pulmonary nodule, right    a. 10/2014 Cardiac CTA: 7mm RLL nodule; b. 04/2015 CT Chest: stable 7mm RLL nodule. No new nodules; 02/2017 CTA Chest: stable, benign, 7mm RLL  pulm nodule.   Rotator cuff tear, left    Sleep difficulties    a.) takes melatonin + trazodone  PRN   SVT (supraventricular tachycardia)    Tubular adenoma of colon     Surgical History: Past Surgical History:  Procedure Laterality Date   ATRIAL FIBRILLATION ABLATION N/A 11/17/2022   Procedure: ATRIAL FIBRILLATION ABLATION; Location: [facility]; Surgeon: Gehi, Anil, MD   BICEPT TENODESIS Right 09/27/2021   Procedure: Right reverse shoulder arthroplasty, biceps tenodesis;  Surgeon: Tobie Priest, MD;  Location: [facility];  Service: Orthopedics;  Laterality: Right;   BLADDER INSTILLATION N/A 01/14/2024   Procedure: INSTILLATION, BLADDER;  Surgeon: Twylla Glendia BROCKS, MD;  Location: [facility];  Service: Urology;  Laterality: N/A;  Gemcitabine     CARDIOVERSION N/A 05/15/2019   Procedure: CARDIOVERSION;  Surgeon: Perla Evalene PARAS, MD;  Location: [facility];  Service: Cardiovascular;  Laterality: N/A;   CARDIOVERSION N/A 03/13/2014   Procedure: CARDIOVERSION; Location: [facility]; Surgeon: Evalene Perla, MD   CARDIOVERSION N/A 03/24/2022   Procedure: CARDIOVERSION;  Surgeon: Perla Evalene PARAS, MD;  Location: [facility];  Service: Cardiovascular;  Laterality: N/A;   CARDIOVERSION N/A 05/19/2022   Procedure: CARDIOVERSION;  Surgeon: Perla Evalene PARAS, MD;  Location: [facility];  Service: Cardiovascular;  Laterality: N/A;   CARDIOVERSION N/A 07/25/2022   Procedure: CARDIOVERSION;  Surgeon: Fernande Elspeth BROCKS, MD;  Location: [facility];  Service: Cardiovascular;  Laterality: N/A;   CARDIOVERSION N/A 09/21/2022   Procedure: CARDIOVERSION;  Surgeon: Fernande Elspeth BROCKS, MD;  Location: [facility];  Service: Cardiovascular;  Laterality: N/A;   CARPAL TUNNEL RELEASE Right 12/23/2014   Procedure: CARPAL TUNNEL RELEASE;  Surgeon: Kayla Pinal, MD;  Location: [facility];  Service: Orthopedics;  Laterality: Right;   CARPAL TUNNEL RELEASE Left 01/06/2015   Procedure: CARPAL TUNNEL RELEASE;  Surgeon: Kayla Pinal, MD;  Location: [facility];  Service: Orthopedics;  Laterality: Left;   COLONOSCOPY WITH PROPOFOL  N/A 10/08/2017   Procedure: COLONOSCOPY WITH PROPOFOL ;  Surgeon: Unk Corinn Skiff, MD;  [facility] END[facility];  Service: Gastroenterology;  Laterality: N/A;   COLONOSCOPY WITH PROPOFOL  N/A 06/07/2022   Procedure: COLONOSCOPY WITH PROPOFOL ;  Surgeon: Unk Corinn Skiff, MD;  Location[facility] END[facility];  Service: Gastroenterology;  Laterality: N/A;   ENTEROSCOPY  06/07/2022   Procedure: ENTEROSCOPY;  Surgeon: Unk Corinn Skiff, MD;  Location[facility] END[facility]COPY;  Service: Gastroenterology;;   ESOPHAGOGASTRODUODENOSCOPY (EGD) WITH PROPOFOL  N/A 06/07/2022   Procedure: ESOPHAGOGASTRODUODENOSCOPY (EGD) WITH PROPOFOL ;  Surgeon: Unk Corinn Skiff, MD;  Location: [facility]  ENDOSCOPY;  Service: Gastroenterology;  Laterality: N/A;   FLEXIBLE SIGMOIDOSCOPY N/A 11/02/2023   Procedure: KINGSTON SIDE;  Surgeon: Unk Corinn Skiff, MD;  Location: [facility] ENDOSCOPY;  Service: Gastroenterology;  Laterality: N/A;   GASTRIC BYPASS  01/17/2000   KNEE SURGERY Right    knee trauma x3   prostate seeding     REVERSE SHOULDER ARTHROPLASTY Right 09/27/2021   Procedure: Right reverse shoulder arthroplasty, biceps tenodesis;  Surgeon: Tobie Priest, MD;  Location: [facility];  Service: Orthopedics;  Laterality: Right;   REVERSE SHOULDER ARTHROPLASTY Left 02/20/2023   Procedure: Left reverse shoulder arthroplasty, biceps tenodesis;  Surgeon: Tobie Priest, MD;  Location: [facility];  Service: Orthopedics;  Laterality: Left;   SHOULDER ARTHROSCOPY WITH ROTATOR CUFF REPAIR AND OPEN BICEPS TENODESIS Left 12/27/2021   Procedure: Left shoulder arthroscopic cuff repair (supraspinatus and subscapularis) with Regeneten Patch application;  Surgeon: Tobie Priest, MD;  Location: [facility];  Service: Orthopedics;  Laterality: Left;   TRANSURETHRAL RESECTION OF BLADDER TUMOR N/A 01/14/2024  Procedure: TURBT (TRANSURETHRAL RESECTION OF BLADDER TUMOR);  Surgeon: Twylla Glendia BROCKS, MD;  Location: [facility];  Service: Urology;  Laterality: N/A;    Home Medications:  Allergies as of 01/22/2024   No Known Allergies      Medication List        Accurate as of January 22, 2024  3:02 PM. If you have any questions, ask your nurse or doctor.          amLODipine  5 MG tablet Commonly known as: NORVASC  Take 1 tablet (5 mg total) by mouth daily.   benazepril  40 MG tablet Commonly known as: LOTENSIN  TAKE ONE TABLET BY MOUTH ONE TIME DAILY   busPIRone  10 MG tablet Commonly known as: BUSPAR  TAKE ONE TABLET BY MOUTH FOUR TIMES A DAY AS NEEDED FOR ANIXETY   carboxymethylcellulose 0.5 % Soln Commonly known as: REFRESH PLUS Place 1-2 drops into both eyes 3 (three) times daily as needed  (dry/irritated eyes.).   ciprofloxacin  250 MG tablet Commonly known as: Cipro  Take 1 tablet (250 mg total) by mouth 2 (two) times daily.   CITRACAL CALCIUM  +D3 PO Take 1 tablet by mouth daily.   cyanocobalamin  1000 MCG/ML injection Commonly known as: VITAMIN B12 Inject 1 mL (1,000 mcg total) into the skin every 30 (thirty) days.   cyclobenzaprine  10 MG tablet Commonly known as: FLEXERIL  TAKE ONE TABLET BY MOUTH AT BEDTIME   DULoxetine  60 MG capsule Commonly known as: CYMBALTA  Take 1 capsule (60 mg total) by mouth daily.   FERROUS BISGLYCINATE CHELATE PO Take 25 mg by mouth at bedtime.   fluticasone  50 MCG/ACT nasal spray Commonly known as: FLONASE  USE TWO SPRAYS IN EACH NOSTRIL ONE TIME DAILY   furosemide  20 MG tablet Commonly known as: LASIX  Take 20 mg by mouth daily.   HYDROcodone -acetaminophen  7.5-325 MG tablet Commonly known as: NORCO Take 1 tablet by mouth every 6 (six) hours as needed.   melatonin 3 MG Tabs tablet Take 3 mg by mouth at bedtime.   multivitamin with minerals Tabs tablet Take 1 tablet by mouth in the morning.   oxybutynin  10 MG 24 hr tablet Commonly known as: DITROPAN -XL Take 1 tablet (10 mg total) by mouth daily.   Pancrelipase (Lip-Prot-Amyl) 24000-76000 units Cpep Take 1 capsule by mouth 2 (two) times daily with a meal.   traZODone  150 MG tablet Commonly known as: DESYREL  TAKE ONE TABLET BY MOUTH AT BEDTIME   Vitamin D  125 MCG Caps Take 5,000 Units by mouth daily.   Xarelto  20 MG Tabs tablet Generic drug: rivaroxaban  TAKE ONE TABLET BY MOUTH ONE TIME DAILY WITH SUPPER   zolpidem  6.25 MG CR tablet Commonly known as: AMBIEN  CR Take 1 tablet (6.25 mg total) by mouth at bedtime as needed for sleep.        Allergies: Allergies[1]  Family History: Family History  Problem Relation Age of Onset   Heart disease Father    Hypertension Father    Stroke Father    Cancer Father        prostate cancer   Cancer Sister     Arrhythmia Sister        A-fib   Arrhythmia Brother        A-fib   Cancer Brother     Social History:  reports that he quit smoking about 44 years ago. His smoking use included cigarettes. He started smoking about 54 years ago. He has a 10 pack-year smoking history. He has quit using smokeless tobacco.  His smokeless  tobacco use included snuff. He reports that he does not currently use alcohol . He reports that he does not use drugs.   Physical Exam: BP (!) 178/117   Pulse 89   Ht 6' (1.829 m)   Wt (!) 348 lb (157.9 kg)   BMI 47.20 kg/m   Constitutional:  Alert, No acute distress. HEENT: [place] AT Respiratory: Normal respiratory effort, no increased work of breathing. Psychiatric: Normal mood and affect.   Assessment & Plan:    1.  Urothelial carcinoma bladder HG Ta The pathology report was discussed in detail.  We discussed high-grade disease has a risk of recurrence and progression and recommend induction BCG immunotherapy We discussed he will be scheduled for weekly instillations x 6 and a follow-up cystoscopy 6-8 weeks after his last instillation.  The most common side effects of storage related voiding symptoms and dysuria were discussed less common side effects of flulike symptoms and rarely BCG sepsis was discussed He desires to schedule and we will plan on starting in 4-6 weeks PVR today was 9 mL   Glendia JAYSON Barba, MD  [facility] [address], [address] [place], [place] [address] [phone]     [1] No Known Allergies  "

## 2024-01-24 NOTE — Telephone Encounter (Signed)
 Pt has planned induction BCG immunotherapy. Confirmed with Pharm-D; this is compatible with Tikosyn.

## 2024-01-25 ENCOUNTER — Telehealth (HOSPITAL_COMMUNITY): Payer: Self-pay

## 2024-01-25 LAB — CBC WITH DIFFERENTIAL/PLATELET
Absolute Lymphocytes: 1981 {cells}/uL (ref 850–3900)
Absolute Monocytes: 644 {cells}/uL (ref 200–950)
Basophils Absolute: 28 {cells}/uL (ref 0–200)
Basophils Relative: 0.4 %
Eosinophils Absolute: 98 {cells}/uL (ref 15–500)
Eosinophils Relative: 1.4 %
HCT: 41.4 % (ref 39.4–51.1)
Hemoglobin: 13.5 g/dL (ref 13.2–17.1)
MCH: 28.3 pg (ref 27.0–33.0)
MCHC: 32.6 g/dL (ref 31.6–35.4)
MCV: 86.8 fL (ref 81.4–101.7)
MPV: 8.8 fL (ref 7.5–12.5)
Monocytes Relative: 9.2 %
Neutro Abs: 4249 {cells}/uL (ref 1500–7800)
Neutrophils Relative %: 60.7 %
Platelets: 256 Thousand/uL (ref 140–400)
RBC: 4.77 Million/uL (ref 4.20–5.80)
RDW: 13.1 % (ref 11.0–15.0)
Total Lymphocyte: 28.3 %
WBC: 7 Thousand/uL (ref 3.8–10.8)

## 2024-01-25 LAB — HEMOGLOBIN A1C
Hgb A1c MFr Bld: 5.6 %
Mean Plasma Glucose: 114 mg/dL
eAG (mmol/L): 6.3 mmol/L

## 2024-01-25 LAB — COMPLETE METABOLIC PANEL WITHOUT GFR
AG Ratio: 2.1 (calc) (ref 1.0–2.5)
ALT: 15 U/L (ref 9–46)
AST: 16 U/L (ref 10–35)
Albumin: 4.1 g/dL (ref 3.6–5.1)
Alkaline phosphatase (APISO): 69 U/L (ref 35–144)
BUN: 16 mg/dL (ref 7–25)
CO2: 25 mmol/L (ref 20–32)
Calcium: 8.7 mg/dL (ref 8.6–10.3)
Chloride: 108 mmol/L (ref 98–110)
Creat: 0.93 mg/dL (ref 0.70–1.28)
Globulin: 2 g/dL (ref 1.9–3.7)
Glucose, Bld: 96 mg/dL (ref 65–99)
Potassium: 4.2 mmol/L (ref 3.5–5.3)
Sodium: 142 mmol/L (ref 135–146)
Total Bilirubin: 0.4 mg/dL (ref 0.2–1.2)
Total Protein: 6.1 g/dL (ref 6.1–8.1)

## 2024-01-25 LAB — LIPID PANEL
Cholesterol: 223 mg/dL — ABNORMAL HIGH
HDL: 56 mg/dL
LDL Cholesterol (Calc): 143 mg/dL — ABNORMAL HIGH
Non-HDL Cholesterol (Calc): 167 mg/dL — ABNORMAL HIGH
Total CHOL/HDL Ratio: 4 (calc)
Triglycerides: 120 mg/dL

## 2024-01-25 LAB — TSH: TSH: 1.74 m[IU]/L (ref 0.40–4.50)

## 2024-01-25 LAB — PSA: PSA: 0.05 ng/mL

## 2024-01-25 NOTE — Telephone Encounter (Signed)
 Initiated prior authorization for Tikosyn admission. Date of service: 02/18/2024 No pre-certification is required.  Reference # [id number]

## 2024-01-28 MED ORDER — ZOLPIDEM TARTRATE 5 MG PO TABS: 5.0000 mg | ORAL_TABLET | Freq: Every evening | ORAL | 0 refills | Status: DC | PRN

## 2024-01-28 NOTE — Addendum Note (Signed)
 Addended by: EDMAN MARSA PARAS on: 01/28/2024 01:44 PM   Modules accepted: Orders

## 2024-02-04 ENCOUNTER — Other Ambulatory Visit: Payer: Self-pay | Admitting: Family Medicine

## 2024-02-04 DIAGNOSIS — F419 Anxiety disorder, unspecified: Secondary | ICD-10-CM

## 2024-02-05 NOTE — Telephone Encounter (Signed)
 Requested Prescriptions  Pending Prescriptions Disp Refills   DULoxetine  (CYMBALTA ) 60 MG capsule [Pharmacy Med Name: DULOXETINE  DR 60 MG CAP[*]] 90 capsule 0    Sig: TAKE ONE CAPSULE BY MOUTH ONE TIME DAILY     Psychiatry: Antidepressants - SNRI - duloxetine  Failed - 02/05/2024 10:43 AM      Failed - Last BP in normal range    BP Readings from Last 1 Encounters:  01/22/24 (!) 178/117         Passed - Cr in normal range and within 360 days    Creat  Date Value Ref Range Status  01/24/2024 0.93 0.70 - 1.28 mg/dL Final         Passed - eGFR is 30 or above and within 360 days    GFR, Est African American  Date Value Ref Range Status  07/21/2020 72 > OR = 60 mL/min/1.82m2 Final   GFR, Est Non African American  Date Value Ref Range Status  07/21/2020 62 > OR = 60 mL/min/1.12m2 Final   GFR, Estimated  Date Value Ref Range Status  12/26/2023 >60 >60 mL/min Final    Comment:    (NOTE) Calculated using the CKD-EPI Creatinine Equation (2021)    eGFR  Date Value Ref Range Status  11/28/2023 78 >59 mL/min/1.73 Final         Passed - Completed PHQ-2 or PHQ-9 in the last 360 days      Passed - Valid encounter within last 6 months    Recent Outpatient Visits           4 months ago Acute diverticulitis   [place] [facility] [place] Marsa PARAS, DO   6 months ago Annual physical exam   [place] [facility] Marsa PARAS, DO   9 months ago Acute cystitis with hematuria   [place] [facility] Marsa PARAS, DO       Future Appointments     Next 5 Appointments             In 1 month McGowan, Clotilda DELENA RIGGERS [facility] [place]   In 1 month McGowan, Clotilda DELENA RIGGERS [facility]rology [place]   In 1 month McGowan, Clotilda DELENA RIGGERS [facility] Urology [place]   In 2 months McGowan, Clotilda DELENA RIGGERS [facility] Urology [place]   In 2 months McGowan, Clotilda DELENA RIGGERS [facility] Urology [place]         Displaying the next 5 appointments. This patient has additional appointments scheduled.

## 2024-02-06 ENCOUNTER — Encounter: Payer: Self-pay | Admitting: Family Medicine

## 2024-02-06 ENCOUNTER — Ambulatory Visit: Admitting: Family Medicine

## 2024-02-06 ENCOUNTER — Other Ambulatory Visit: Payer: Self-pay | Admitting: Family Medicine

## 2024-02-06 VITALS — BP 124/82 | HR 107 | Ht 72.0 in | Wt 350.0 lb

## 2024-02-06 DIAGNOSIS — G4700 Insomnia, unspecified: Secondary | ICD-10-CM | POA: Diagnosis not present

## 2024-02-06 DIAGNOSIS — I4819 Other persistent atrial fibrillation: Secondary | ICD-10-CM | POA: Diagnosis not present

## 2024-02-06 DIAGNOSIS — M25512 Pain in left shoulder: Secondary | ICD-10-CM

## 2024-02-06 DIAGNOSIS — F419 Anxiety disorder, unspecified: Secondary | ICD-10-CM

## 2024-02-06 DIAGNOSIS — G8929 Other chronic pain: Secondary | ICD-10-CM | POA: Diagnosis not present

## 2024-02-06 DIAGNOSIS — Z8546 Personal history of malignant neoplasm of prostate: Secondary | ICD-10-CM

## 2024-02-06 DIAGNOSIS — R7309 Other abnormal glucose: Secondary | ICD-10-CM | POA: Diagnosis not present

## 2024-02-06 DIAGNOSIS — Z6841 Body Mass Index (BMI) 40.0 and over, adult: Secondary | ICD-10-CM | POA: Diagnosis not present

## 2024-02-06 DIAGNOSIS — C679 Malignant neoplasm of bladder, unspecified: Secondary | ICD-10-CM | POA: Diagnosis not present

## 2024-02-06 DIAGNOSIS — I5032 Chronic diastolic (congestive) heart failure: Secondary | ICD-10-CM

## 2024-02-06 MED ORDER — TEMAZEPAM 15 MG PO CAPS: 15.0000 mg | ORAL_CAPSULE | Freq: Every evening | ORAL | 0 refills | Status: DC | PRN

## 2024-02-06 NOTE — Progress Notes (Incomplete)
 "  Subjective:    Patient ID: [name], male    DOB: Dec 06, 1955, 69 y.o.   MRN: [id number]  [name] is a 69 y.o. male presenting on 02/06/2024 for Medical Management of Chronic Issues   HPI  Discussed the use of AI scribe software for clinical note transcription with the patient, who gave verbal consent to proceed.  History of Present Illness   [name] is a 69 year old male with atrial fibrillation, shoulder issues, and bladder cancer who presents for a follow-up visit.  Orthopedics Dr Tobie and Dr Sharrie - History left shoulder, rotator cuff tear, enthesopathy Left shoulder pain and swelling - Persistent swelling and discomfort in the left shoulder following two unsuccessful surgeries and subsequent shoulder replacement - Surgeon identified possible infection; not detected in aspirated fluid, suspected to be in tissue - Further intervention pending resolution of other health issues  Cardiology Atrial Fibrillation Followed by Chantal Needle NP - Persistent atrial fibrillation despite previous ablation one year ago - Heart rhythm monitoring revealed ongoing arrhythmia - Describes heart 'fluttering' during monitoring; atrial fibrillation began shortly after monitor removal He is in Atrial Flutter still Upcoming scheduled w/ EP for initiating Tikosyn anti-arrhythmia Upcoming 02/18/24 Cardiology   Bladder cancer and urological complications Followed by [facility] Dr Twylla Identified bladder cancer after UTIs and gross hematuria - Bladder cancer diagnosed after infection traced to malignancy - Underwent surgical removal of bladder cancer on December 29th - Received intravesical chemotherapy following surgery - Catheter complication episode  Trigger Finger s/p surgery - Underwent hand surgery for two trigger fingers on January 19th - No complications following procedure  Insomnia Sleep disturbance - Significant sleep disturbance attributed to atrial fibrillation and  cancer-related stress - Tried Ambien  (regular and time-release) with limited success - Increased Ambien  dose to 10 mg, resulting in two hours of sleep last night - Issues with insomnia started recently in December following plan for Cardiology to initiate Tikosyn and he has to  - Discontinued trazodone  on December 20th after tapering; difficulty adjusting to sleep without it  Anxiety - Experiences anxiety, attributed to lack of sleep and stress from medical conditions - Takes Cymbalta  in the morning and occasionally at night, along with Buspirone  - Took additional nighttime doses of Cymbalta  three times in the past month due to increased anxiety      ***Insomnia Tried Buspar  managing anxiety Continues Duloxetine  60mg  daily and occasional very rare took additional Duloxetine  60mg   ***Tapered off Trazodone  successfully over 2 weeks  ***A1c   Immunization status - Tetanus vaccine last administered in 2014, due for booster - Considering pneumococcal and shingles vaccines - Hesitant about shingles vaccine due to prior chickenpox infection   Prostate cancer surveillance - History of prostate cancer treated with brachytherapy - PSA levels remain very low and undetectable at 0.05   Metabolic and endocrine function - Thyroid  function within normal limits - Recent laboratory studies show normal blood counts, kidney, and liver function - Electrolytes (sodium, potassium, calcium , chloride) within normal ranges   Glycemic control - A1c levels have ranged from 5.3 to 5.7 over the past few years - Recent A1c peaked at 5.7, attributed to increased candy consumption   Peripheral edema and diuresis - Experiences fluid retention, particularly in the knees - Prescribed furosemide  20 mg as needed - Significant leg edema reduces overnight, resulting in bed wet from sweating   Weight management concerns - Interested in weight management options, including medications similar to those used by a  family member - Concerned about cost and insurance coverage for weight management medications     HYPERLIPIDEMIA: - Reports concerns. Last lipid panel 07/2023, elevated LDL 150s now off statin Previously tried Atorvastatin  40mg , 10mg  and Rosuvastatin  10mg  also Zetia  with myalgia Not on med now   Symptomatic anemia, IDA Followed by Dr Rennie [facility] S/p IV iron  infusions with improvement   Orthopedic update in future, has had issue with knee swelling, Right side.   Persistent AFib - resolved s/p cardioversion Followed by Dr Gollan previously and Dr Fernande, successfully cardioverted the AFib   Followed by Dr Ebbie [facility] On Cymbalta  30mg  daily, and asked if we can adjust dose to double dose 60mg  Not returning to Rheumatology, he will return to Dr Marcelino for arthritis   Upcoming L shoulder repair, cow tissue before, now needs repeat CT imaging and future surgery repair.      Health Maintenance:   PSA Undetectable 0.05   Last Colonoscopy 06/07/22 repeat 5 years, 2029      02/06/2024   11:47 PM 12/26/2023    3:39 PM 12/26/2023    2:57 PM  Depression screen PHQ 2/9  Decreased Interest 0 0 0  Down, Depressed, Hopeless 0 0 0  PHQ - 2 Score 0 0 0       09/19/2023    2:20 PM 08/06/2023    1:15 PM 05/08/2023    3:14 PM 01/24/2023    8:24 AM  GAD 7 : Generalized Anxiety Score  Nervous, Anxious, on Edge 1  1  2  1    Control/stop worrying 1  0  1  0   Worry too much - different things 1  0  0  0   Trouble relaxing 0  0  0  1   Restless 0  0  0  0   Easily annoyed or irritable 1  1  2  1    Afraid - awful might happen 0  0  1  0   Total GAD 7 Score 4 2 6 3   Anxiety Difficulty Somewhat difficult Somewhat difficult Very difficult      Data saved with a previous flowsheet row definition    Social History[1]  Review of Systems Per HPI unless specifically indicated above     Objective:    BP 124/82 (BP Location: Left Arm, Patient Position: Sitting, Cuff  Size: Large)   Pulse (!) 107   Ht 6' (1.829 m)   Wt (!) 350 lb (158.8 kg)   SpO2 94%   BMI 47.47 kg/m   Wt Readings from Last 3 Encounters:  02/06/24 (!) 350 lb (158.8 kg)  01/22/24 (!) 348 lb (157.9 kg)  01/16/24 (!) 348 lb 5.2 oz (158 kg)    Physical Exam Vitals and nursing note reviewed.  Constitutional:      General: He is not in acute distress.    Appearance: He is well-developed. He is obese. He is not diaphoretic.     Comments: Well-appearing, comfortable, cooperative  HENT:     Head: Normocephalic and atraumatic.  Eyes:     General:        Right eye: No discharge.        Left eye: No discharge.     Conjunctiva/sclera: Conjunctivae normal.     Pupils: Pupils are equal, round, and reactive to light.  Neck:     Thyroid : No thyromegaly.     Vascular: No carotid bruit.  Cardiovascular:     Rate and  Rhythm: Normal rate and regular rhythm.     Pulses: Normal pulses.     Heart sounds: Normal heart sounds. No murmur heard. Pulmonary:     Effort: Pulmonary effort is normal. No respiratory distress.     Breath sounds: Normal breath sounds. No wheezing or rales.  Abdominal:     General: Bowel sounds are normal. There is no distension.     Palpations: Abdomen is soft. There is no mass.     Tenderness: There is no abdominal tenderness.  Musculoskeletal:        General: No tenderness. Normal range of motion.     Cervical back: Normal range of motion and neck supple.     Right lower leg: No edema.     Left lower leg: No edema.     Comments: Upper / Lower Extremities: - Normal muscle tone, strength bilateral upper extremities 5/5, lower extremities 5/5  Lymphadenopathy:     Cervical: No cervical adenopathy.  Skin:    General: Skin is warm and dry.     Findings: No erythema or rash.  Neurological:     Mental Status: He is alert and oriented to person, place, and time.     Comments: Distal sensation intact to light touch all extremities  Psychiatric:        Mood and  Affect: Mood normal.        Behavior: Behavior normal.        Thought Content: Thought content normal.     Comments: Well groomed, good eye contact, normal speech and thoughts     Results for orders placed or performed in visit on 01/22/24  BLADDER SCAN AMB NON-IMAGING   Collection Time: 01/22/24  2:54 PM  Result Value Ref Range   Scan Result 9ml    *Note: Due to a large number of results and/or encounters for the requested time period, some results have not been displayed. A complete set of results can be found in Results Review.      Assessment & Plan:   Problem List Items Addressed This Visit     Insomnia - Primary   Relevant Medications   temazepam  (RESTORIL ) 15 MG capsule   Paroxysmal atrial fibrillation   Other Visit Diagnoses       Anxiety         Elevated hemoglobin A1c            Insomnia Chronic insomnia worsened by trazodone  discontinuation and Ambien  initiation. Tikosyn for AFib requires trazodone  cessation due to QT prolongation risk. Benzodiazepines discussed as alternatives. - Continue Ambien  10 mg as needed for sleep. - Ordered temazepam  15 mg if Ambien  is ineffective. - Advised to avoid mixing Ambien  and temazepam . - Instructed to monitor sleep patterns and report temazepam  effectiveness.  Anxiety disorder Anxiety worsened by insomnia and recent health issues. Managed with Cymbalta  and buspirone . - Continue Cymbalta  and buspirone  as prescribed. - Monitor anxiety levels and adjust medication as needed.        No orders of the defined types were placed in this encounter.   Meds ordered this encounter  Medications   temazepam  (RESTORIL ) 15 MG capsule    Sig: Take 1 capsule (15 mg total) by mouth at bedtime as needed for sleep.    Dispense:  30 capsule    Refill:  0    Change from Zolpidem  to Temazepam     Follow up plan: Return if symptoms worsen or fail to improve.   Marsa Officer, DO [facility] 02/06/2024, 3:23 PM       [1] Social History Tobacco Use   Smoking status: Former    Current packs/day: 0.00    Average packs/day: 1 pack/day for 10.0 years (10.0 ttl pk-yrs)    Types: Cigarettes    Start date: 02/17/1969    Quit date: 02/18/1979    Years since quitting: 44.9   Smokeless tobacco: Former    Types: Snuff  Vaping Use   Vaping status: Never Used  Substance Use Topics   Alcohol  use: Not Currently    Comment: last drink in 2021   Drug use: No  "

## 2024-02-06 NOTE — Patient Instructions (Addendum)
 Thank you for coming to the office today.  Finish Zolpidem  5mg  x 2 = 10mg  nightly for next few nights, I did change it now to Temazepam  instead 15mg  nightly, please use with caution, risk sedation.  Try this for several days to a week or more, if it works we can add refills or inc to 30mg .  Or if you prefer Zolpidem  ambien  5mg  x 2 = 10mg , I can send the 10mg  whole tablet to pharmacy.  Keep in touch if need anything else.   Please schedule a Follow-up Appointment to: Return if symptoms worsen or fail to improve.  If you have any other questions or concerns, please feel free to call the office or send a message through MyChart. You may also schedule an earlier appointment if necessary.  Additionally, you may be receiving a survey about your experience at our office within a few days to 1 week by e-mail or mail. We value your feedback.  Marsa Officer, DO [facility], [place]

## 2024-02-06 NOTE — Progress Notes (Unsigned)
 "  Subjective:    Patient ID: [name], male    DOB: Sep 05, 1955, 69 y.o.   MRN: [id number]  REXFORD PREVO is a 69 y.o. male presenting on 02/06/2024 for Medical Management of Chronic Issues   HPI  Discussed the use of AI scribe software for clinical note transcription with the patient, who gave verbal consent to proceed.  History of Present Illness   VENANCIO CHENIER is a 69 year old male with atrial fibrillation, shoulder issues, and bladder cancer who presents for a follow-up visit.  Orthopedics Dr Tobie and Dr Sharrie - History left shoulder, rotator cuff tear, enthesopathy Left shoulder pain and swelling - Persistent swelling and discomfort in the left shoulder following two unsuccessful surgeries and subsequent shoulder replacement - Surgeon identified possible infection; not detected in aspirated fluid, suspected to be in tissue - Further intervention pending resolution of other health issues  Cardiology Atrial Fibrillation Chronic Diastolic CHF Followed by Chantal Needle NP - Persistent atrial fibrillation despite previous ablation one year ago - Heart rhythm monitoring revealed ongoing arrhythmia - Describes heart 'fluttering' during monitoring; atrial fibrillation began shortly after monitor removal He is in Atrial Flutter still Upcoming scheduled w/ EP for initiating Tikosyn anti-arrhythmia Upcoming 02/18/24 Cardiology   Bladder cancer and urological complications Followed by [facility] Dr Twylla Identified bladder cancer after UTIs and gross hematuria - Bladder cancer diagnosed after infection traced to malignancy - Underwent surgical removal of bladder cancer on December 29th - Received intravesical chemotherapy following surgery - Catheter complication episode  Trigger Finger s/p surgery - Underwent hand surgery for two trigger fingers on January 19th - No complications following procedure  Insomnia Sleep disturbance - Significant sleep disturbance attributed to atrial  fibrillation and cancer-related stress - Tried Ambien  (regular and time-release) with limited success - Increased Ambien  dose to 10 mg, resulting in two hours of sleep last night - Issues with insomnia started recently in December following plan for Cardiology to initiate Tikosyn and he has to come off of all QT Prolongation medications including Trazodone . - Discontinued trazodone  on December 20th after tapering; difficulty adjusting to sleep without it  Anxiety - Experiences anxiety, attributed to lack of sleep and stress from medical conditions - Takes Cymbalta  in the morning and occasionally at night, along with Buspirone  - Took additional nighttime doses of Cymbalta  three times in the past month due to increased anxiety      Prostate cancer surveillance - History of prostate cancer treated with brachytherapy - PSA levels remain very low and undetectable at 0.05   Elevated A1c Glycemic control - A1c levels have ranged from 5.3 to 5.7 over the past few years - Recent A1c peaked at 5.7, attributed to increased candy consumption        Health Maintenance:    Last Colonoscopy 06/07/22 repeat 5 years, 2029      02/06/2024   11:47 PM 12/26/2023    3:39 PM 12/26/2023    2:57 PM  Depression screen PHQ 2/9  Decreased Interest 0 0 0  Down, Depressed, Hopeless 0 0 0  PHQ - 2 Score 0 0 0       09/19/2023    2:20 PM 08/06/2023    1:15 PM 05/08/2023    3:14 PM 01/24/2023    8:24 AM  GAD 7 : Generalized Anxiety Score  Nervous, Anxious, on Edge 1  1  2  1    Control/stop worrying 1  0  1  0   Worry too  much - different things 1  0  0  0   Trouble relaxing 0  0  0  1   Restless 0  0  0  0   Easily annoyed or irritable 1  1  2  1    Afraid - awful might happen 0  0  1  0   Total GAD 7 Score 4 2 6 3   Anxiety Difficulty Somewhat difficult Somewhat difficult Very difficult      Data saved with a previous flowsheet row definition    Social History[1]  Review of Systems Per HPI  unless specifically indicated above     Objective:    BP 124/82 (BP Location: Left Arm, Patient Position: Sitting, Cuff Size: Large)   Pulse (!) 107   Ht 6' (1.829 m)   Wt (!) 350 lb (158.8 kg)   SpO2 94%   BMI 47.47 kg/m   Wt Readings from Last 3 Encounters:  02/06/24 (!) 350 lb (158.8 kg)  01/22/24 (!) 348 lb (157.9 kg)  01/16/24 (!) 348 lb 5.2 oz (158 kg)    Physical Exam Vitals and nursing note reviewed.  Constitutional:      General: He is not in acute distress.    Appearance: He is well-developed. He is obese. He is not diaphoretic.     Comments: Well-appearing, comfortable, cooperative  HENT:     Head: Normocephalic and atraumatic.  Eyes:     General:        Right eye: No discharge.        Left eye: No discharge.     Conjunctiva/sclera: Conjunctivae normal.     Pupils: Pupils are equal, round, and reactive to light.  Neck:     Thyroid : No thyromegaly.     Vascular: No carotid bruit.  Cardiovascular:     Rate and Rhythm: Normal rate and regular rhythm.     Pulses: Normal pulses.     Heart sounds: Normal heart sounds. No murmur heard. Pulmonary:     Effort: Pulmonary effort is normal. No respiratory distress.     Breath sounds: Normal breath sounds. No wheezing or rales.  Abdominal:     General: Bowel sounds are normal. There is no distension.     Palpations: Abdomen is soft. There is no mass.     Tenderness: There is no abdominal tenderness.  Musculoskeletal:        General: No tenderness. Normal range of motion.     Cervical back: Normal range of motion and neck supple.     Right lower leg: No edema.     Left lower leg: No edema.     Comments: Upper / Lower Extremities: - Normal muscle tone, strength bilateral upper extremities 5/5, lower extremities 5/5  Lymphadenopathy:     Cervical: No cervical adenopathy.  Skin:    General: Skin is warm and dry.     Findings: No erythema or rash.  Neurological:     Mental Status: He is alert and oriented to person,  place, and time.     Comments: Distal sensation intact to light touch all extremities  Psychiatric:        Mood and Affect: Mood normal.        Behavior: Behavior normal.        Thought Content: Thought content normal.     Comments: Well groomed, good eye contact, normal speech and thoughts     Results for orders placed or performed in visit on 01/22/24  BLADDER  SCAN AMB NON-IMAGING   Collection Time: 01/22/24  2:54 PM  Result Value Ref Range   Scan Result 9ml    *Note: Due to a large number of results and/or encounters for the requested time period, some results have not been displayed. A complete set of results can be found in Results Review.      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 Tikosyn for AFib requires trazodone  cessation due to QT prolongation risk. Benzodiazepines discussed as alternatives. - Past failure on Mirtazapine  and Belsomra . - Off Trazodone  - Continue Ambien  10 mg as needed for sleep. (Double dose 5mg  tabs) has about 1-2 weeks remaining if improving that is okay we can continue or re order this in future. - As alternative - next move, we will trial BDZ. - Ordered temazepam  15 mg if Ambien  is ineffective. - Advised to avoid mixing Ambien  and temazepam . - Instructed to monitor sleep patterns and report temazepam  effectiveness.  Anxiety disorder Anxiety worsened by insomnia and recent health issues. Managed with  Cymbalta  and buspirone . - Continue Cymbalta  and buspirone  as prescribed. - Monitor anxiety levels and adjust medication as needed.     Atrial Fibrillation Chronic Diastolic CHF / HFpEF  Followed by Cardiology / EP Upcoming initiation on Tikosyn, has come off QT prolong medications Continues current therapy anticoagulation Xarelto   Urothelial Carcinoma Followed by [facility] Urology Recent diagnosis, managed with s/p surgical resection and now bladder chemotherapy treatments  History of prostate cancer On surveillance, PSA undetectable.  L Shoulder Chronic Pain History of surgery previously Pending further management per Ortho  Elevated A1c Improved, stable current lab result. Below pre DM range  Morbid Obesity BMI >47 Encouraging Lifestyle modification when physically able to resume exercise and continue diet  No orders of the defined types were placed in this encounter.   Meds ordered this encounter  Medications   temazepam  (RESTORIL ) 15 MG capsule    Sig: Take 1 capsule (15 mg total) by mouth at bedtime as needed for sleep.    Dispense:  30 capsule    Refill:  0    Change from Zolpidem  to Temazepam     Follow up plan: Return if symptoms worsen or fail to improve.   Marsa Officer, DO [facility] 02/06/2024, 3:23 PM     [1]  Social History Tobacco Use   Smoking status: Former    Current packs/day: 0.00    Average packs/day: 1 pack/day for 10.0 years (10.0 ttl pk-yrs)    Types: Cigarettes    Start date: 02/17/1969    Quit date: 02/18/1979    Years since quitting: 45.0   Smokeless tobacco: Former    Types: Snuff  Vaping Use   Vaping status: Never Used  Substance Use Topics   Alcohol  use: Not Currently  Comment: last drink in 2021   Drug use: No   "

## 2024-02-07 DIAGNOSIS — C679 Malignant neoplasm of bladder, unspecified: Secondary | ICD-10-CM | POA: Insufficient documentation

## 2024-02-12 ENCOUNTER — Telehealth: Admitting: Family Medicine

## 2024-02-12 ENCOUNTER — Telehealth: Payer: Self-pay

## 2024-02-12 ENCOUNTER — Encounter: Payer: Self-pay | Admitting: Family Medicine

## 2024-02-12 ENCOUNTER — Ambulatory Visit: Admitting: Urology

## 2024-02-12 ENCOUNTER — Encounter: Payer: Self-pay | Admitting: Urology

## 2024-02-12 VITALS — BP 134/87 | HR 98 | Temp 99.7°F

## 2024-02-12 DIAGNOSIS — R3989 Other symptoms and signs involving the genitourinary system: Secondary | ICD-10-CM | POA: Diagnosis not present

## 2024-02-12 DIAGNOSIS — G4700 Insomnia, unspecified: Secondary | ICD-10-CM | POA: Diagnosis not present

## 2024-02-12 DIAGNOSIS — R310 Gross hematuria: Secondary | ICD-10-CM

## 2024-02-12 DIAGNOSIS — C61 Malignant neoplasm of prostate: Secondary | ICD-10-CM | POA: Diagnosis not present

## 2024-02-12 DIAGNOSIS — Z87440 Personal history of urinary (tract) infections: Secondary | ICD-10-CM | POA: Diagnosis not present

## 2024-02-12 DIAGNOSIS — C679 Malignant neoplasm of bladder, unspecified: Secondary | ICD-10-CM

## 2024-02-12 LAB — MICROSCOPIC EXAMINATION

## 2024-02-12 LAB — BLADDER SCAN AMB NON-IMAGING

## 2024-02-12 MED ORDER — ZOLPIDEM TARTRATE 10 MG PO TABS: 10.0000 mg | ORAL_TABLET | Freq: Every evening | ORAL | 2 refills | Status: AC | PRN

## 2024-02-12 MED ORDER — CEFTRIAXONE SODIUM 1 G IJ SOLR
1.0000 g | Freq: Once | INTRAMUSCULAR | Status: AC
  Administered 2024-02-12: 1 g via INTRAMUSCULAR

## 2024-02-12 NOTE — Progress Notes (Addendum)
 "  Subjective:    Patient ID: [name], male    DOB: 1955/12/16, 69 y.o.   MRN: [id number]  HENSLEY TREAT is a 69 y.o. male presenting on 02/12/2024 for Insomnia   Virtual / Telehealth Encounter - Video Visit via MyChart The purpose of this virtual visit is to provide medical care while limiting exposure to the novel coronavirus (COVID19) for both patient and office staff.  Consent was obtained for remote visit:  Yes.   Answered questions that patient had about telehealth interaction:  Yes.   I discussed the limitations, risks, security and privacy concerns of performing an evaluation and management service by video/telephone. I also discussed with the patient that there may be a patient responsible charge related to this service. The patient expressed understanding and agreed to proceed.  Patient Location: [facility] Provider Location: [facility] (Office)  Participants in virtual visit: - Patient: [name] - CMA: Alan Fontana CMA - Provider: Dr Edman   HPI  Discussed the use of AI scribe software for clinical note transcription with the patient, who gave verbal consent to proceed.  History of Present Illness   [name] is a 69 year old male who presents for a follow-up regarding sleep medication management.  Insomnia - Currently manages sleep issues with Ambien , taking 5 mg twice nightly, he has contacted us  on MyChart for dose inc from 5 to 10mg  and scheduled this appointment trying to get the dose adjustment. - Achieves 5 to 7 hours of sleep per night, which is an improvement from previous sleep duration - Has not yet initiated temazepam  15 mg, which was picked up six days ago, he prefers to avoid taking it. - Prefers to continue current Ambien  regimen      02/06/2024   11:47 PM 12/26/2023    3:39 PM 12/26/2023    2:57 PM  Depression screen PHQ 2/9  Decreased Interest 0 0 0  Down, Depressed, Hopeless 0 0 0  PHQ - 2 Score 0 0 0       09/19/2023     2:20 PM 08/06/2023    1:15 PM 05/08/2023    3:14 PM 01/24/2023    8:24 AM  GAD 7 : Generalized Anxiety Score  Nervous, Anxious, on Edge 1  1  2  1    Control/stop worrying 1  0  1  0   Worry too much - different things 1  0  0  0   Trouble relaxing 0  0  0  1   Restless 0  0  0  0   Easily annoyed or irritable 1  1  2  1    Afraid - awful might happen 0  0  1  0   Total GAD 7 Score 4 2 6 3   Anxiety Difficulty Somewhat difficult Somewhat difficult Very difficult      Data saved with a previous flowsheet row definition    Social History[1]  Review of Systems Per HPI unless specifically indicated above     Objective:    There were no vitals taken for this visit.  Wt Readings from Last 3 Encounters:  02/06/24 (!) 350 lb (158.8 kg)  01/22/24 (!) 348 lb (157.9 kg)  01/16/24 (!) 348 lb 5.2 oz (158 kg)     Physical Exam   Note examination was completely remotely via video observation objective data only    Results for orders placed or performed in visit on 01/22/24  BLADDER  SCAN AMB NON-IMAGING   Collection Time: 01/22/24  2:54 PM  Result Value Ref Range   Scan Result 9ml    *Note: Due to a large number of results and/or encounters for the requested time period, some results have not been displayed. A complete set of results can be found in Results Review.      Assessment & Plan:   Problem List Items Addressed This Visit     Insomnia - Primary   Relevant Medications   zolpidem  (AMBIEN ) 10 MG tablet   Insomnia Improved on current dose Ambien  5mg  x 2 = 10mg  He never took BDZ Temazepam .  Today we proceeded w/ the Ambien / Zolpidem  and this is a dose adjustment based on our last visit less than 1 week ago, 02/06/24 and I have sent the higher dose Zolpidem  10mg  nightly instead of 5mg  x 2 that was working as we discussed previously. He will remain off Temazepam  and continue Ambien  10mg  nightly AS NEEDED insomnia.  No orders of the defined types were placed in this  encounter.   Meds ordered this encounter  Medications   zolpidem  (AMBIEN ) 10 MG tablet    Sig: Take 1 tablet (10 mg total) by mouth at bedtime as needed for sleep.    Dispense:  30 tablet    Refill:  2    Discontinue Temazepam . Patient prefers Ambien  and not to take Temazepam .    Follow up plan: Return if symptoms worsen or fail to improve.   Patient verbalizes understanding with the above medical recommendations including the limitation of remote medical advice.  Specific follow-up and call-back criteria were given for patient to follow-up or seek medical care more urgently if needed.  Total duration of direct patient care provided via video conference: 5 minutes   Marsa Officer, DO [facility] 02/12/2024, 3:19 PM     [1]  Social History Tobacco Use   Smoking status: Former    Current packs/day: 0.00    Average packs/day: 1 pack/day for 10.0 years (10.0 ttl pk-yrs)    Types: Cigarettes    Start date: 02/17/1969    Quit date: 02/18/1979    Years since quitting: 45.0   Smokeless tobacco: Former    Types: Snuff  Vaping Use   Vaping status: Never Used  Substance Use Topics   Alcohol  use: Not Currently    Comment: last drink in 2021   Drug use: No   "

## 2024-02-12 NOTE — Progress Notes (Signed)
 "    02/12/2024 4:55 PM   [name] 1955-09-30 [id number]  Referring provider: Edman Marsa JINNY, DO [address] [place],  [place] [address]  Urological history: 1. Urothelial carcinoma bladder - HG Ta - TURBT 01/14/2024 for a 3 cm right mid posterior wall bladder tumor which was completely resected. Obturator reflex was encountered during resection and the deeper muscle was visualized but no fat.  - Scheduled for induction BCG in March  2. Prostate cancer  - PSA (01/2024) 0.05  - brachytherapy (2014)   3. Nephrolithiasis - punctate 1 mm non-obstructing left lower pole calculus  4. Bosniak II Left renal cyst   Chief Complaint  Patient presents with   bladder pain   HPI: [name] is a 69 y.o. man who presents today for possible UTI with his wife, Madeline.    Previous records reviewed.  Last week he started seeing streaks of blood in his urine, then over the weekend he started experiencing right sided flank pain that continued to worsen along with the increased amount of blood in his urine.  He was reaching fevers of 101.4.  He had a leftover prescription of Septra  DS and he took 3 tablets yesterday and 1 this morning.  He feels his fever broke about 4 AM this morning.    UA  yellow cloudy, specific gravity 1.025, pH 5.5, 1+ protein, 3+ heme, trace leukocyte, 6 to WBCs, greater than 30 RBCs, 0-10 epithelial cells and few bacteria  PVR 0 mL    PMH: Past Medical History:  Diagnosis Date   (HFpEF) heart failure with preserved ejection fraction (HCC)    a.) TTE 12/26/2013: EF 45-50%, mild ant and antsept HK. mild MR. Mod dil LA. nl RV fxn. Rhythm was Afib; b.) TTE 07/04/2019: EF 55%, mid-apical anteroseptal HK, mild MAC, mild Ao sclerosis, G2DD, RVSP 45.3; c.) TTE 06/02/2022: EF 55-60%, no RWMAs, sev LAE, RVSP 37.9, mild-mod MR, mild AR, AoV sclerosis without stenosis, asc Ao 38 mm   Anxiety    Aortic atherosclerosis    Aortic root dilatation    Bladder tumor     Cardiomyopathy (in setting of Afib)    a.) TTE 12/26/2013: EF 45-50%; b.) TTE 07/04/2019: EF 55%; c.) TTE 06/02/2022: EF 55-60%   Cholelithiasis    Chronic pain syndrome    a.) followed by pain management   Chronic, continuous use of opioids    a.) on COT; followed by pain management   Coronary artery disease 11/06/2014   a.) cCTA 11/06/2014: Ca2+ = 224 (74th %ile; LAD distribution)   Diverticulosis    DJD (degenerative joint disease) of knee    Former smoker    History of hiatal hernia    History of kidney stones 2012   Hyperlipidemia    Hyperplastic colon polyp    Hypertension    Inguinal hernia, left    Internal hemorrhoids    Intervertebral disc disorder with radiculopathy of lumbosacral region    Lesion of bone of lumbosacral spine    L5   Lower extremity edema    Mixed hyperlipidemia    Morbid obesity (HCC)    Multiple falls    Myalgia due to statin    On rivaroxaban  therapy    Osteoarthritis    PAF (paroxysmal atrial fibrillation) (HCC)    a.) CHA2DS2VASc = 4 (age, HTN, HFpEF, vasc disease) as of 02/19/23;  b.) s/p DCCV 03/13/14 (200 J), 05/15/19 (150 J x 1, 200 J x2), 05/19/22 (150 J  x1, 200 J x2; pads changed to ant/lat postion with additional 200 J x 2 -> did not convert), 07/25/22 (200 J), 09/21/22 (200 J); c.) s/p PVI ablation 11/17/22; d.) req to d/c amiodarone  12/2023 - plans for tikosyn early 2026; chronically anticoag on rivaroxaban    Pernicious anemia    Prostate cancer (HCC)    Pulmonary nodule, right    a. 10/2014 Cardiac CTA: 7mm RLL nodule; b. 04/2015 CT Chest: stable 7mm RLL nodule. No new nodules; 02/2017 CTA Chest: stable, benign, 7mm RLL pulm nodule.   Rotator cuff tear, left    Sleep difficulties    a.) takes melatonin + trazodone  PRN   SVT (supraventricular tachycardia)    Tubular adenoma of colon     Surgical History: Past Surgical History:  Procedure Laterality Date   ATRIAL FIBRILLATION ABLATION N/A 11/17/2022   Procedure: ATRIAL FIBRILLATION ABLATION;  Location: [facility]; Surgeon: Gehi, Anil, MD   BICEPT TENODESIS Right 09/27/2021   Procedure: Right reverse shoulder arthroplasty, biceps tenodesis;  Surgeon: Tobie Priest, MD;  Location: [facility];  Service: Orthopedics;  Laterality: Right;   BLADDER INSTILLATION N/A 01/14/2024   Procedure: INSTILLATION, BLADDER;  Surgeon: Twylla Glendia BROCKS, MD;  Location: [facility];  Service: Urology;  Laterality: N/A;  Gemcitabine    CARDIOVERSION N/A 05/15/2019   Procedure: CARDIOVERSION;  Surgeon: Perla Evalene PARAS, MD;  Location: [facility];  Service: Cardiovascular;  Laterality: N/A;   CARDIOVERSION N/A 03/13/2014   Procedure: CARDIOVERSION; Location: [facility]; Surgeon: Evalene Perla, MD   CARDIOVERSION N/A 03/24/2022   Procedure: CARDIOVERSION;  Surgeon: Perla Evalene PARAS, MD;  Location: [facility];  Service: Cardiovascular;  Laterality: N/A;   CARDIOVERSION N/A 05/19/2022   Procedure: CARDIOVERSION;  Surgeon: Perla Evalene PARAS, MD;  Location: [facility];  Service: Cardiovascular;  Laterality: N/A;   CARDIOVERSION N/A 07/25/2022   Procedure: CARDIOVERSION;  Surgeon: Fernande Elspeth BROCKS, MD;  Location: [facility];  Service: Cardiovascular;  Laterality: N/A;   CARDIOVERSION N/A 09/21/2022   Procedure: CARDIOVERSION;  Surgeon: Fernande Elspeth BROCKS, MD;  Location: [facility];  Service: Cardiovascular;  Laterality: N/A;   CARPAL TUNNEL RELEASE Right 12/23/2014   Procedure: CARPAL TUNNEL RELEASE;  Surgeon: Kayla Pinal, MD;  Location: [facility];  Service: Orthopedics;  Laterality: Right;   CARPAL TUNNEL RELEASE Left 01/06/2015   Procedure: CARPAL TUNNEL RELEASE;  Surgeon: Kayla Pinal, MD;  Location: [facility];  Service: Orthopedics;  Laterality: Left;   COLONOSCOPY WITH PROPOFOL  N/A 10/08/2017   Procedure: COLONOSCOPY WITH PROPOFOL ;  Surgeon: Unk Corinn Skiff, MD;  Location: [facility];  Service: Gastroenterology;  Laterality: N/A;   COLONOSCOPY WITH PROPOFOL  N/A 06/07/2022   Procedure: COLONOSCOPY WITH PROPOFOL ;  Surgeon: Unk Corinn Skiff, MD;  Location: [facility];  Service: Gastroenterology;  Laterality: N/A;   ENTEROSCOPY  06/07/2022   Procedure: ENTEROSCOPY;  Surgeon: Unk Corinn Skiff, MD;  Location: [facility];  Service: Gastroenterology;;   ESOPHAGOGASTRODUODENOSCOPY (EGD) WITH PROPOFOL  N/A 06/07/2022   Procedure: ESOPHAGOGASTRODUODENOSCOPY (EGD) WITH PROPOFOL ;  Surgeon: Unk Corinn Skiff, MD;  Location: [facility];  Service: Gastroenterology;  Laterality: N/A;   FLEXIBLE SIGMOIDOSCOPY N/A 11/02/2023   Procedure: KINGSTON SIDE;  Surgeon: Unk Corinn Skiff, MD;  Location: [facility];  Service: Gastroenterology;  Laterality: N/A;   GASTRIC BYPASS  01/17/2000   KNEE SURGERY Right    knee trauma x3   prostate seeding     REVERSE SHOULDER ARTHROPLASTY Right 09/27/2021   Procedure: Right reverse shoulder arthroplasty, biceps tenodesis;  Surgeon: Tobie Priest, MD;  Location: [facility];  Service: Orthopedics;  Laterality: Right;   REVERSE SHOULDER ARTHROPLASTY Left 02/20/2023   Procedure: Left reverse shoulder arthroplasty, biceps tenodesis;  Surgeon: Tobie Priest, MD;  Location: [facility];  Service: Orthopedics;  Laterality: Left;   SHOULDER ARTHROSCOPY WITH ROTATOR CUFF REPAIR AND OPEN BICEPS TENODESIS Left 12/27/2021   Procedure: Left shoulder arthroscopic cuff repair (supraspinatus and subscapularis) with Regeneten Patch application;  Surgeon: Tobie Priest, MD;  Location: [facility];  Service: Orthopedics;  Laterality: Left;   TRANSURETHRAL RESECTION OF BLADDER TUMOR N/A 01/14/2024   Procedure: TURBT (TRANSURETHRAL RESECTION OF BLADDER TUMOR);  Surgeon: Twylla Glendia BROCKS, MD;  Location: [facility];  Service: Urology;  Laterality: N/A;    Home Medications:  Allergies as of 02/12/2024   No Known Allergies      Medication List        Accurate as of February 12, 2024  4:55 PM. If you have any questions, ask your nurse or doctor.          STOP taking these medications    temazepam   15 MG capsule Commonly known as: RESTORIL  Stopped by: Marsa Officer, DO       TAKE these medications    amLODipine  5 MG tablet Commonly known as: NORVASC  Take 1 tablet (5 mg total) by mouth daily.   benazepril  40 MG tablet Commonly known as: LOTENSIN  TAKE ONE TABLET BY MOUTH ONE TIME DAILY   busPIRone  10 MG tablet Commonly known as: BUSPAR  TAKE ONE TABLET BY MOUTH FOUR TIMES A DAY AS NEEDED FOR ANIXETY   carboxymethylcellulose 0.5 % Soln Commonly known as: REFRESH PLUS Place 1-2 drops into both eyes 3 (three) times daily as needed (dry/irritated eyes.).   CITRACAL CALCIUM  +D3 PO Take 1 tablet by mouth daily.   cyanocobalamin  1000 MCG/ML injection Commonly known as: VITAMIN B12 Inject 1 mL (1,000 mcg total) into the skin every 30 (thirty) days.   cyclobenzaprine  10 MG tablet Commonly known as: FLEXERIL  TAKE ONE TABLET BY MOUTH AT BEDTIME   DULoxetine  60 MG capsule Commonly known as: CYMBALTA  TAKE ONE CAPSULE BY MOUTH ONE TIME DAILY   FERROUS BISGLYCINATE CHELATE PO Take 25 mg by mouth at bedtime.   fluticasone  50 MCG/ACT nasal spray Commonly known as: FLONASE  USE TWO SPRAYS IN EACH NOSTRIL ONE TIME DAILY   furosemide  20 MG tablet Commonly known as: LASIX  Take 20 mg by mouth daily.   HYDROcodone -acetaminophen  7.5-325 MG tablet Commonly known as: NORCO Take 1 tablet by mouth every 6 (six) hours as needed.   melatonin 3 MG Tabs tablet Take 3 mg by mouth at bedtime.   multivitamin with minerals Tabs tablet Take 1 tablet by mouth in the morning.   Pancrelipase (Lip-Prot-Amyl) 24000-76000 units Cpep Take 1 capsule by mouth 2 (two) times daily with a meal.   Vitamin D  125 MCG Caps Take 5,000 Units by mouth daily.   Xarelto  20 MG Tabs tablet Generic drug: rivaroxaban  TAKE ONE TABLET BY MOUTH ONE TIME DAILY WITH SUPPER   zolpidem  10 MG tablet Commonly known as: AMBIEN  Take 1 tablet (10 mg total) by mouth at bedtime as needed for sleep. Started  by: Marsa Officer, DO        Allergies: Allergies[1]  Family History: Family History  Problem Relation Age of Onset   Heart disease Father    Hypertension Father    Stroke Father    Cancer Father        prostate cancer   Cancer Sister    Arrhythmia Sister  A-fib   Arrhythmia Brother        A-fib   Cancer Brother     Social History:  reports that he quit smoking about 45 years ago. His smoking use included cigarettes. He started smoking about 55 years ago. He has a 10 pack-year smoking history. He has quit using smokeless tobacco.  His smokeless tobacco use included snuff. He reports that he does not currently use alcohol . He reports that he does not use drugs.  ROS: Pertinent ROS in HPI  Physical Exam: BP 134/87   Pulse 98   Temp 99.7 F (37.6 C) (Oral)   SpO2 97%   Constitutional:  Well nourished. Alert and oriented, No acute distress. HEENT: [place] AT, moist mucus membranes.  Trachea midline Cardiovascular: No clubbing, cyanosis, or edema. Respiratory: Normal respiratory effort, no increased work of breathing. Neurologic: Grossly intact, no focal deficits, moving all 4 extremities. Psychiatric: Normal mood and affect.  Laboratory Data: See Epic and HPI   I have reviewed the labs.   Pertinent Imaging:  02/12/24 16:06  Scan Result 0mL    Assessment & Plan:    1. Suspected UTI - UA w/ few bacteria and hematuria  - Urine culture pending - given 1 gram Rocephin  today in office  - continue Septra  tomorrow  - Advised patient to increase fluid intake and monitor symptoms - follow-up or call if no improvement within 48-72 hours or if symptoms worsen (fever, back pain)  2. Bladder cancer - return in March for induction BCG   3. Prostate cancer - continue biannual PSA's    Return for Follow up pending labs.  These notes generated with voice recognition software. I apologize for typographical errors.  CLOTILDA CORNWALL, PA-C  [facility] [address]  [address] [place], [place] [address] [phone]     [1] No Known Allergies  "

## 2024-02-12 NOTE — Telephone Encounter (Signed)
 Pt called the triage line reporting back pain, dysuria, difficulty urinating, and pink-tinted urine. Patient stated he has been taking leftover antibiotics and reports some improvement in symptoms. Patient was advised to come in for evaluation due to symptoms and urine discoloration, especially given medical history. Patient was educated that leftover antibiotics may not adequately treat the infection, depending on the bacteria is involved. Patient verbalized understanding and stated they will come in today for a scheduled appointment.

## 2024-02-12 NOTE — Addendum Note (Signed)
 Addended by: EDMAN MARSA PARAS on: 02/12/2024 05:35 PM   Modules accepted: Level of Service

## 2024-02-12 NOTE — Patient Instructions (Addendum)

## 2024-02-13 ENCOUNTER — Ambulatory Visit: Payer: Self-pay | Admitting: Cardiology

## 2024-02-13 LAB — URINALYSIS, COMPLETE
Bilirubin, UA: NEGATIVE
Glucose, UA: NEGATIVE
Ketones, UA: NEGATIVE
Nitrite, UA: NEGATIVE
Specific Gravity, UA: 1.025 (ref 1.005–1.030)
Urobilinogen, Ur: 0.2 mg/dL (ref 0.2–1.0)
pH, UA: 5.5 (ref 5.0–7.5)

## 2024-02-13 LAB — MICROSCOPIC EXAMINATION: RBC, Urine: >30 /HPF — AB (ref 0–2)

## 2024-02-13 NOTE — Progress Notes (Unsigned)
 PROVIDER NOTE: Interpretation of information contained herein should be left to medically-trained personnel. Specific patient instructions are provided elsewhere under Patient Instructions section of medical record. This document was created in part using AI and STT-dictation technology, any transcriptional errors that may result from this process are unintentional.  Patient: [name]  Service: E/M   PCP: Edman Marsa JINNY, DO  DOB: 1955/05/06  DOS: 02/14/2024  Provider: Emmy MARLA Blanch, NP  MRN: [id number]  Delivery: Face-to-face  Specialty: Interventional Pain Management  Type: Established Patient  Setting: Ambulatory outpatient facility  Specialty designation: 09  Referring Prov.: Edman Marsa JINNY, DO  Location: Outpatient office facility       History of present illness (HPI) [name], a 69 y.o. year old male, is here today because of his No primary diagnosis found.. [name] primary complain today is No chief complaint on file.  Pertinent problems: [name] has History of prostate cancer; Essential hypertension; Medication management; DJD (degenerative joint disease) of knee; Bilateral carpal tunnel syndrome; Morbid obesity with BMI of 45.0-49.9, adult (HCC); Intervertebral disc disorder with radiculopathy of lumbosacral region; Chronic radicular lumbar pain; Lumbar radiculopathy; Lumbar degenerative disc disease; Lumbar facet arthropathy; Lumbar facet joint syndrome; Primary osteoarthritis of both wrists; Trigger middle finger of left hand; Chronic pain syndrome; Trigger finger of right hand; Localized osteoarthritis of shoulder regions, bilateral; Chronic pain of both shoulders; S/p reverse total shoulder arthroplasty; Carpal tunnel syndrome; Osteoarthritis of knee (R>L); and Chronic pain of both knees on their pertinent problem list.  Pain Assessment: Severity of   is reported as a  /10. Location:    / . Onset:  . Quality:  . Timing:  . Modifying factor(s):   SABRA Vitals:  vitals were not taken for this visit.  BMI: Estimated body mass index is 47.47 kg/m as calculated from the following:   Height as of 02/06/24: 6' (1.829 m).   Weight as of 02/06/24: 350 lb (158.8 kg).  Last encounter: 11/15/2023. Last procedure: Visit date not found.  Reason for encounter:  *** .   Discussed the use of AI scribe software for clinical note transcription with the patient, who gave verbal consent to proceed.  History of Present Illness           Pharmacotherapy Assessment   Hydrocodone -acetaminophen  7.5-325 mg 3 times daily as needed for pain. MME=30 Monitoring: [place] PMP: PDMP not reviewed this encounter.       Pharmacotherapy: No side-effects or adverse reactions reported. Compliance: No problems identified. Effectiveness: Clinically acceptable.  No notes on file  UDS:  Summary  Date Value Ref Range Status  08/20/2023 FINAL  Final    Comment:    ==================================================================== [facility] (MW) ==================================================================== Test                             Result       Flag       Units  Drug Present and Declared for Prescription Verification   Hydrocodone                     1763         EXPECTED   ng/mg creat   Dihydrocodeine                 248          EXPECTED   ng/mg creat   Norhydrocodone  1162         EXPECTED   ng/mg creat    Sources of hydrocodone  include scheduled prescription medications.    Dihydrocodeine and norhydrocodone are expected metabolites of    hydrocodone . Dihydrocodeine is also available as a scheduled    prescription medication.  ==================================================================== Test                      Result    Flag   Units      Ref Range   Creatinine              200              mg/dL      >=79 ==================================================================== Declared Medications:  The flagging  and interpretation on this report are based on the  following declared medications.  Unexpected results may arise from  inaccuracies in the declared medications.   **Note: The testing scope of this panel includes these medications:   Hydrocodone  (Norco)   **Note: The testing scope of this panel does not include the  following reported medications:   Acetaminophen  (Norco)  Amlodipine  (Norvasc )  Atorvastatin  (Lipitor)  Benazepril  (Lotensin )  Buspirone  (Buspar )  Calcium   Cyclobenzaprine  (Flexeril )  Duloxetine  (Cymbalta )  Eye Drops  Fluticasone  (Flonase )  Furosemide  (Lasix )  Iron   Magnesium  Melatonin  Multivitamin  Rivaroxaban  (Xarelto )  Trazodone  (Desyrel )  Vitamin B12  Vitamin D   Vitamin D3 ==================================================================== For clinical consultation, please call [phone]. ====================================================================     No results found for: CBDTHCR No results found for: D8THCCBX No results found for: D9THCCBX  ROS  Constitutional: Denies any fever or chills Gastrointestinal: No reported hemesis, hematochezia, vomiting, or acute GI distress Musculoskeletal: Denies any acute onset joint swelling, redness, loss of ROM, or weakness Neurological: No reported episodes of acute onset apraxia, aphasia, dysarthria, agnosia, amnesia, paralysis, loss of coordination, or loss of consciousness  Medication Review  Calcium -Magnesium-Vitamin D , DULoxetine , Ferrous Bisglycinate Chelate, HYDROcodone -acetaminophen , Pancrelipase (Lip-Prot-Amyl), Vitamin D , amLODipine , benazepril , busPIRone , carboxymethylcellulose, cyanocobalamin , cyclobenzaprine , fluticasone , furosemide , melatonin, multivitamin with minerals, rivaroxaban , and zolpidem   History Review  Allergy: [name] has no known allergies. Drug: [name]  reports no history of drug use. Alcohol :  reports that he does not currently use alcohol . Tobacco:   reports that he quit smoking about 45 years ago. His smoking use included cigarettes. He started smoking about 55 years ago. He has a 10 pack-year smoking history. He has quit using smokeless tobacco.  His smokeless tobacco use included snuff. Social: [name]  reports that he quit smoking about 45 years ago. His smoking use included cigarettes. He started smoking about 55 years ago. He has a 10 pack-year smoking history. He has quit using smokeless tobacco.  His smokeless tobacco use included snuff. He reports that he does not currently use alcohol . He reports that he does not use drugs. Medical:  has a past medical history of (HFpEF) heart failure with preserved ejection fraction (HCC), Anxiety, Aortic atherosclerosis, Aortic root dilatation, Bladder tumor, Cardiomyopathy (in setting of Afib), Cholelithiasis, Chronic pain syndrome, Chronic, continuous use of opioids, Coronary artery disease (11/06/2014), Diverticulosis, DJD (degenerative joint disease) of knee, Former smoker, History of hiatal hernia, History of kidney stones (2012), Hyperlipidemia, Hyperplastic colon polyp, Hypertension, Inguinal hernia, left, Internal hemorrhoids, Intervertebral disc disorder with radiculopathy of lumbosacral region, Lesion of bone of lumbosacral spine, Lower extremity edema, Mixed hyperlipidemia, Morbid obesity (HCC), Multiple falls, Myalgia due to statin, On rivaroxaban  therapy, Osteoarthritis, PAF (paroxysmal atrial fibrillation) (  HCC), Pernicious anemia, Prostate cancer (HCC), Pulmonary nodule, right, Rotator cuff tear, left, Sleep difficulties, SVT (supraventricular tachycardia), and Tubular adenoma of colon. Surgical: [name]  has a past surgical history that includes Knee surgery (Right); Gastric bypass (01/17/2000); prostate seeding; Carpal tunnel release (Right, 12/23/2014); Carpal tunnel release (Left, 01/06/2015); Colonoscopy with propofol  (N/A, 10/08/2017); Cardioversion (N/A, 05/15/2019); Cardioversion (N/A,  03/13/2014); Reverse shoulder arthroplasty (Right, 09/27/2021); Bicept tenodesis (Right, 09/27/2021); Shoulder arthroscopy with rotator cuff repair and open biceps tenodesis (Left, 12/27/2021); Cardioversion (N/A, 03/24/2022); Cardioversion (N/A, 05/19/2022); Colonoscopy with propofol  (N/A, 06/07/2022); Esophagogastroduodenoscopy (egd) with propofol  (N/A, 06/07/2022); enteroscopy (06/07/2022); Cardioversion (N/A, 07/25/2022); Cardioversion (N/A, 09/21/2022); Atrial fibrillation ablation (N/A, 11/17/2022); Reverse shoulder arthroplasty (Left, 02/20/2023); Flexible sigmoidoscopy (N/A, 11/02/2023); Transurethral resection of bladder tumor (N/A, 01/14/2024); and Bladder instillation (N/A, 01/14/2024). Family: family history includes Arrhythmia in his brother and sister; Cancer in his brother, father, and sister; Heart disease in his father; Hypertension in his father; Stroke in his father.  Laboratory Chemistry Profile   Renal Lab Results  Component Value Date   BUN 16 01/24/2024   CREATININE 0.93 01/24/2024   BCR SEE NOTE: 01/24/2024   GFRAA 72 07/21/2020   GFRNONAA >60 12/26/2023    Hepatic Lab Results  Component Value Date   AST 16 01/24/2024   ALT 15 01/24/2024   ALBUMIN 4.0 09/13/2023   ALKPHOS 73 09/13/2023   LIPASE 28 03/13/2017    Electrolytes Lab Results  Component Value Date   NA 142 01/24/2024   K 4.2 01/24/2024   CL 108 01/24/2024   CALCIUM  8.7 01/24/2024   MG 2.2 11/28/2023    Bone Lab Results  Component Value Date   VD25OH 30.79 06/27/2023   25OHVITD1 32 02/01/2021   25OHVITD2 <1.0 02/01/2021   25OHVITD3 32 02/01/2021    Inflammation (CRP: Acute Phase) (ESR: Chronic Phase) Lab Results  Component Value Date   CRP 1.7 02/09/2022   ESRSEDRATE 6 02/09/2022   LATICACIDVEN 1.2 09/13/2023         Note: Above Lab results reviewed.  Recent Imaging Review  LONG TERM MONITOR (3-14 DAYS) Patch Wear Time:  6 days and 21 hours (2025-11-14T21:53:50-0500 to   2025-11-21T19:38:09-498)  HR 51 - 164, average 86 bpm. 1 nonsustained VT (longest 4 beats). Atrial flutter detected. 100% burden, occurring continuously throughout  the monitoring period. No supraventricular ectopy due to AFL. Rare ventricular ectopy. Symptom trigger episodes correspond to predominantly atrial  fibrillation/flutter with rapid rates.  [facility] Cardiac Electrophysiology Note: Reviewed        Physical Exam  Vitals: There were no vitals taken for this visit. BMI: Estimated body mass index is 47.47 kg/m as calculated from the following:   Height as of 02/06/24: 6' (1.829 m).   Weight as of 02/06/24: 350 lb (158.8 kg). Ideal: Ideal body weight: 77.6 kg (171 lb 1.2 oz) Adjusted ideal body weight: 110.1 kg (242 lb 10.3 oz) General appearance: Well nourished, well developed, and well hydrated. In no apparent acute distress Mental status: Alert, oriented x 3 (person, place, & time)       Respiratory: No evidence of acute respiratory distress Eyes: PERLA   Assessment   Diagnosis Status  No diagnosis found. Controlled Controlled Controlled   Updated Problems: No problems updated.  Plan of Care  Problem-specific:  Assessment and Plan            [name] has a current medication list which includes the following long-term medication(s): amlodipine , benazepril , duloxetine , fluticasone , xarelto , and  zolpidem .  Pharmacotherapy (Medications Ordered): No orders of the defined types were placed in this encounter.  Orders:  No orders of the defined types were placed in this encounter.    {There is no content from the last Plan section.}   No follow-ups on file.    Recent Visits Date Type Provider Dept  11/15/23 Office Visit Viren Lebeau K, NP [facility]  Showing recent visits within past 90 days and meeting all other requirements Future Appointments Date Type Provider Dept  02/14/24 Appointment Malyna Budney K, NP [facility]  Showing future appointments within next 90 days and meeting all other requirements  I discussed the assessment and treatment plan with the patient. The patient was provided an opportunity to ask questions and all were answered. The patient agreed with the plan and demonstrated an understanding of the instructions.  Patient advised to call back or seek an in-person evaluation if the symptoms or condition worsens.  Duration of encounter: *** minutes.  Total time on encounter, as per AMA guidelines included both the face-to-face and non-face-to-face time personally spent by the physician and/or other qualified health care professional(s) on the day of the encounter (includes time in activities that require the physician or other qualified health care professional and does not include time in activities normally performed by clinical staff). Physician's time may include the following activities when performed: Preparing to see the patient (e.g., pre-charting review of records, searching for previously ordered imaging, lab work, and nerve conduction tests) Review of prior analgesic pharmacotherapies. Reviewing PMP Interpreting ordered tests (e.g., lab work, imaging, nerve conduction tests) Performing post-procedure evaluations, including interpretation of diagnostic procedures Obtaining and/or reviewing separately obtained history Performing a medically appropriate examination and/or evaluation Counseling and educating the patient/family/caregiver Ordering medications, tests, or procedures Referring and communicating with other health care professionals (when not separately reported) Documenting clinical information in the electronic or other health record Independently interpreting results (not separately reported) and communicating results to the patient/ family/caregiver Care coordination (not separately reported)  Note by: Irais Mottram K Jquan Egelston, NP (TTS and AI technology used. I apologize for  any typographical errors that were not detected and corrected.) Date: 02/14/2024; Time: 1:52 PM

## 2024-02-14 ENCOUNTER — Ambulatory Visit: Attending: Nurse Practitioner | Admitting: Nurse Practitioner

## 2024-02-14 ENCOUNTER — Telehealth: Payer: Self-pay

## 2024-02-14 ENCOUNTER — Encounter: Payer: Self-pay | Admitting: Nurse Practitioner

## 2024-02-14 ENCOUNTER — Ambulatory Visit: Payer: Self-pay | Admitting: Urology

## 2024-02-14 VITALS — BP 130/78 | HR 115 | Temp 97.0°F | Resp 20 | Ht 72.0 in | Wt 345.0 lb

## 2024-02-14 DIAGNOSIS — M19031 Primary osteoarthritis, right wrist: Secondary | ICD-10-CM | POA: Diagnosis present

## 2024-02-14 DIAGNOSIS — M5416 Radiculopathy, lumbar region: Secondary | ICD-10-CM | POA: Diagnosis present

## 2024-02-14 DIAGNOSIS — M19032 Primary osteoarthritis, left wrist: Secondary | ICD-10-CM | POA: Diagnosis present

## 2024-02-14 DIAGNOSIS — M1711 Unilateral primary osteoarthritis, right knee: Secondary | ICD-10-CM | POA: Insufficient documentation

## 2024-02-14 DIAGNOSIS — J101 Influenza due to other identified influenza virus with other respiratory manifestations: Secondary | ICD-10-CM

## 2024-02-14 DIAGNOSIS — G8929 Other chronic pain: Secondary | ICD-10-CM | POA: Diagnosis present

## 2024-02-14 DIAGNOSIS — M47816 Spondylosis without myelopathy or radiculopathy, lumbar region: Secondary | ICD-10-CM | POA: Insufficient documentation

## 2024-02-14 DIAGNOSIS — Z79899 Other long term (current) drug therapy: Secondary | ICD-10-CM | POA: Diagnosis present

## 2024-02-14 DIAGNOSIS — G894 Chronic pain syndrome: Secondary | ICD-10-CM | POA: Insufficient documentation

## 2024-02-14 DIAGNOSIS — M170 Bilateral primary osteoarthritis of knee: Secondary | ICD-10-CM | POA: Diagnosis present

## 2024-02-14 MED ORDER — OSELTAMIVIR PHOSPHATE 75 MG PO CAPS: 75.0000 mg | ORAL_CAPSULE | Freq: Two times a day (BID) | ORAL | 0 refills | Status: AC

## 2024-02-14 MED ORDER — HYDROCODONE-ACETAMINOPHEN 7.5-325 MG PO TABS: 1.0000 | ORAL_TABLET | Freq: Four times a day (QID) | ORAL | 0 refills | Status: AC | PRN

## 2024-02-14 NOTE — Patient Instructions (Signed)

## 2024-02-14 NOTE — Addendum Note (Signed)
 Addended by: EDMAN MARSA PARAS on: 02/14/2024 03:58 PM   Modules accepted: Orders

## 2024-02-14 NOTE — Telephone Encounter (Signed)
 Copied from CRM #[id number]. Topic: General - Other >> Feb 14, 2024  3:16 PM Zebedee SAUNDERS wrote: Reason for CRM: Pt tested positive for Flu A on home test. Pt would like medication sent to  [facility] [address] [place], [place] - [address] [place] [address] Phone: [phone] Fax: [phone] Pt would like a call [phone] back.

## 2024-02-14 NOTE — Progress Notes (Signed)
 Nursing Pain Medication Assessment:  Safety precautions to be maintained throughout the outpatient stay will include: orient to surroundings, keep bed in low position, maintain call bell within reach at all times, provide assistance with transfer out of bed and ambulation.  Medication Inspection Compliance: Pill count conducted under aseptic conditions, in front of the patient. Neither the pills nor the bottle was removed from the patient's sight at any time. Once count was completed pills were immediately returned to the patient in their original bottle.  Medication: Hydrocodone /APAP Pill/Patch Count: 23 of 120 pills/patches remain Pill/Patch Appearance: Markings consistent with prescribed medication Bottle Appearance: Standard pharmacy container. Clearly labeled. Filled Date: 01 / 05 / 2026 Last Medication intake:  Today

## 2024-02-14 NOTE — Telephone Encounter (Signed)
 Please call him to notify him Rx Tamiflu  sent to [facility]. He should try to pick it up tonight. Thank you!  FYI I just saw him on virtual telemedicine visit 2 days ago, so I would not do any visit or charge for this.  Marsa Officer, DO [facility] 02/14/2024, 3:58 PM

## 2024-02-15 LAB — CULTURE, URINE COMPREHENSIVE

## 2024-02-18 ENCOUNTER — Ambulatory Visit (HOSPITAL_COMMUNITY): Admitting: Nurse Practitioner

## 2024-02-20 ENCOUNTER — Ambulatory Visit (HOSPITAL_COMMUNITY): Admitting: Nurse Practitioner

## 2024-03-03 ENCOUNTER — Ambulatory Visit (HOSPITAL_COMMUNITY): Admitting: Nurse Practitioner

## 2024-03-17 ENCOUNTER — Ambulatory Visit: Admitting: Urology

## 2024-03-24 ENCOUNTER — Ambulatory Visit: Admitting: Urology

## 2024-03-31 ENCOUNTER — Ambulatory Visit: Admitting: Urology

## 2024-04-07 ENCOUNTER — Ambulatory Visit: Admitting: Urology

## 2024-04-14 ENCOUNTER — Ambulatory Visit: Admitting: Urology

## 2024-04-21 ENCOUNTER — Ambulatory Visit: Admitting: Urology

## 2024-04-28 ENCOUNTER — Ambulatory Visit: Admitting: Urology

## 2024-05-05 ENCOUNTER — Encounter: Admitting: Nurse Practitioner

## 2024-06-25 ENCOUNTER — Inpatient Hospital Stay: Admitting: Internal Medicine

## 2024-06-25 ENCOUNTER — Inpatient Hospital Stay
